# Patient Record
Sex: Female | Born: 1950
Health system: Southern US, Community
[De-identification: ages and names within clinical notes are randomized; demographics above are authoritative.]

## PROBLEM LIST (undated history)

## (undated) DIAGNOSIS — G4730 Sleep apnea, unspecified: Secondary | ICD-10-CM

## (undated) DIAGNOSIS — Z992 Dependence on renal dialysis: Secondary | ICD-10-CM

## (undated) DIAGNOSIS — D631 Anemia in chronic kidney disease: Secondary | ICD-10-CM

## (undated) DIAGNOSIS — Z9289 Personal history of other medical treatment: Secondary | ICD-10-CM

## (undated) DIAGNOSIS — E669 Obesity, unspecified: Secondary | ICD-10-CM

## (undated) DIAGNOSIS — J9621 Acute and chronic respiratory failure with hypoxia: Secondary | ICD-10-CM

## (undated) DIAGNOSIS — F32A Depression, unspecified: Secondary | ICD-10-CM

## (undated) DIAGNOSIS — N189 Chronic kidney disease, unspecified: Secondary | ICD-10-CM

## (undated) DIAGNOSIS — E119 Type 2 diabetes mellitus without complications: Secondary | ICD-10-CM

## (undated) DIAGNOSIS — I2510 Atherosclerotic heart disease of native coronary artery without angina pectoris: Secondary | ICD-10-CM

## (undated) DIAGNOSIS — I82401 Acute embolism and thrombosis of unspecified deep veins of right lower extremity: Secondary | ICD-10-CM

## (undated) DIAGNOSIS — I82409 Acute embolism and thrombosis of unspecified deep veins of unspecified lower extremity: Secondary | ICD-10-CM

## (undated) DIAGNOSIS — Z8701 Personal history of pneumonia (recurrent): Secondary | ICD-10-CM

## (undated) DIAGNOSIS — G9340 Encephalopathy, unspecified: Secondary | ICD-10-CM

## (undated) DIAGNOSIS — I472 Ventricular tachycardia, unspecified: Secondary | ICD-10-CM

## (undated) DIAGNOSIS — S82892A Other fracture of left lower leg, initial encounter for closed fracture: Secondary | ICD-10-CM

## (undated) DIAGNOSIS — F329 Major depressive disorder, single episode, unspecified: Secondary | ICD-10-CM

## (undated) DIAGNOSIS — K219 Gastro-esophageal reflux disease without esophagitis: Secondary | ICD-10-CM

## (undated) DIAGNOSIS — R410 Disorientation, unspecified: Secondary | ICD-10-CM

## (undated) DIAGNOSIS — M1990 Unspecified osteoarthritis, unspecified site: Secondary | ICD-10-CM

## (undated) DIAGNOSIS — S82899A Other fracture of unspecified lower leg, initial encounter for closed fracture: Secondary | ICD-10-CM

## (undated) DIAGNOSIS — N186 End stage renal disease: Secondary | ICD-10-CM

## (undated) DIAGNOSIS — F419 Anxiety disorder, unspecified: Secondary | ICD-10-CM

## (undated) DIAGNOSIS — B351 Tinea unguium: Secondary | ICD-10-CM

## (undated) DIAGNOSIS — E11610 Type 2 diabetes mellitus with diabetic neuropathic arthropathy: Secondary | ICD-10-CM

## (undated) DIAGNOSIS — N185 Chronic kidney disease, stage 5: Secondary | ICD-10-CM

## (undated) DIAGNOSIS — E785 Hyperlipidemia, unspecified: Secondary | ICD-10-CM

## (undated) DIAGNOSIS — R6510 Systemic inflammatory response syndrome (SIRS) of non-infectious origin without acute organ dysfunction: Secondary | ICD-10-CM

## (undated) DIAGNOSIS — I10 Essential (primary) hypertension: Secondary | ICD-10-CM

## (undated) DIAGNOSIS — J9601 Acute respiratory failure with hypoxia: Secondary | ICD-10-CM

## (undated) DIAGNOSIS — IMO0001 Reserved for inherently not codable concepts without codable children: Secondary | ICD-10-CM

## (undated) DIAGNOSIS — F319 Bipolar disorder, unspecified: Secondary | ICD-10-CM

## (undated) DIAGNOSIS — Z8709 Personal history of other diseases of the respiratory system: Secondary | ICD-10-CM

## (undated) DIAGNOSIS — R011 Cardiac murmur, unspecified: Secondary | ICD-10-CM

## (undated) DIAGNOSIS — I5042 Chronic combined systolic (congestive) and diastolic (congestive) heart failure: Secondary | ICD-10-CM

## (undated) HISTORY — DX: Type 2 diabetes mellitus with diabetic neuropathic arthropathy: E11.610

## (undated) HISTORY — DX: Anemia in chronic kidney disease: D63.1

## (undated) HISTORY — DX: Other fracture of unspecified lower leg, initial encounter for closed fracture: S82.899A

## (undated) HISTORY — DX: Other fracture of left lower leg, initial encounter for closed fracture: S82.892A

## (undated) HISTORY — DX: Ventricular tachycardia, unspecified: I47.20

## (undated) HISTORY — PX: CARDIAC CATHETERIZATION: SHX172

## (undated) HISTORY — DX: End stage renal disease: N18.6

## (undated) HISTORY — DX: Bipolar disorder, unspecified: F31.9

## (undated) HISTORY — PX: TONSILLECTOMY: SUR1361

## (undated) HISTORY — DX: Chronic combined systolic (congestive) and diastolic (congestive) heart failure: I50.42

## (undated) HISTORY — DX: Reserved for inherently not codable concepts without codable children: IMO0001

## (undated) HISTORY — PX: CORONARY STENT PLACEMENT: SHX1402

## (undated) HISTORY — DX: Atherosclerotic heart disease of native coronary artery without angina pectoris: I25.10

## (undated) HISTORY — DX: Chronic kidney disease, unspecified: N18.9

## (undated) HISTORY — DX: Ventricular tachycardia: I47.2

## (undated) HISTORY — DX: Tinea unguium: B35.1

## (undated) HISTORY — DX: Disorientation, unspecified: R41.0

## (undated) HISTORY — DX: Obesity, unspecified: E66.9

## (undated) HISTORY — DX: Hyperlipidemia, unspecified: E78.5

## (undated) HISTORY — DX: Acute and chronic respiratory failure with hypoxia: J96.21

## (undated) HISTORY — PX: ABDOMINAL HYSTERECTOMY: SHX81

## (undated) HISTORY — PX: CHOLECYSTECTOMY: SHX55

## (undated) HISTORY — DX: Encephalopathy, unspecified: G93.40

## (undated) HISTORY — DX: Systemic inflammatory response syndrome (sirs) of non-infectious origin without acute organ dysfunction: R65.10

## (undated) HISTORY — DX: Acute respiratory failure with hypoxia: J96.01

## (undated) HISTORY — DX: Acute embolism and thrombosis of unspecified deep veins of right lower extremity: I82.401

## (undated) HISTORY — DX: End stage renal disease: Z99.2

---

## 1997-10-03 ENCOUNTER — Ambulatory Visit (HOSPITAL_BASED_OUTPATIENT_CLINIC_OR_DEPARTMENT_OTHER): Admission: RE | Admit: 1997-10-03 | Discharge: 1997-10-03 | Payer: Self-pay | Admitting: General Surgery

## 1998-04-09 ENCOUNTER — Ambulatory Visit (HOSPITAL_BASED_OUTPATIENT_CLINIC_OR_DEPARTMENT_OTHER): Admission: RE | Admit: 1998-04-09 | Discharge: 1998-04-09 | Payer: Self-pay | Admitting: Specialist

## 1998-06-07 ENCOUNTER — Ambulatory Visit (HOSPITAL_BASED_OUTPATIENT_CLINIC_OR_DEPARTMENT_OTHER): Admission: RE | Admit: 1998-06-07 | Discharge: 1998-06-07 | Payer: Self-pay | Admitting: General Surgery

## 1998-06-24 ENCOUNTER — Encounter: Payer: Self-pay | Admitting: Emergency Medicine

## 1998-06-24 ENCOUNTER — Emergency Department (HOSPITAL_COMMUNITY): Admission: EM | Admit: 1998-06-24 | Discharge: 1998-06-24 | Payer: Self-pay | Admitting: Emergency Medicine

## 1998-07-17 ENCOUNTER — Ambulatory Visit (HOSPITAL_COMMUNITY): Admission: RE | Admit: 1998-07-17 | Discharge: 1998-07-17 | Payer: Self-pay | Admitting: Nephrology

## 1998-07-17 ENCOUNTER — Encounter: Payer: Self-pay | Admitting: Nephrology

## 1998-12-31 ENCOUNTER — Inpatient Hospital Stay (HOSPITAL_COMMUNITY): Admission: EM | Admit: 1998-12-31 | Discharge: 1999-01-01 | Payer: Self-pay | Admitting: Nephrology

## 1999-01-01 ENCOUNTER — Inpatient Hospital Stay (HOSPITAL_COMMUNITY): Admission: AD | Admit: 1999-01-01 | Discharge: 1999-01-14 | Payer: Self-pay | Admitting: *Deleted

## 1999-03-24 ENCOUNTER — Encounter: Payer: Self-pay | Admitting: Emergency Medicine

## 1999-03-24 ENCOUNTER — Emergency Department (HOSPITAL_COMMUNITY): Admission: EM | Admit: 1999-03-24 | Discharge: 1999-03-24 | Payer: Self-pay | Admitting: Emergency Medicine

## 1999-04-03 ENCOUNTER — Inpatient Hospital Stay (HOSPITAL_COMMUNITY): Admission: AD | Admit: 1999-04-03 | Discharge: 1999-04-08 | Payer: Self-pay | Admitting: Specialist

## 2000-02-19 ENCOUNTER — Inpatient Hospital Stay (HOSPITAL_COMMUNITY): Admission: EM | Admit: 2000-02-19 | Discharge: 2000-02-26 | Payer: Self-pay | Admitting: Specialist

## 2000-06-17 ENCOUNTER — Encounter: Admission: RE | Admit: 2000-06-17 | Discharge: 2000-09-15 | Payer: Self-pay | Admitting: Specialist

## 2000-07-27 ENCOUNTER — Emergency Department (HOSPITAL_COMMUNITY): Admission: EM | Admit: 2000-07-27 | Discharge: 2000-07-27 | Payer: Self-pay

## 2000-10-13 ENCOUNTER — Encounter: Payer: Self-pay | Admitting: Gastroenterology

## 2000-10-13 ENCOUNTER — Encounter: Admission: RE | Admit: 2000-10-13 | Discharge: 2000-10-13 | Payer: Self-pay | Admitting: Gastroenterology

## 2001-01-27 ENCOUNTER — Encounter: Payer: Self-pay | Admitting: Nephrology

## 2001-01-27 ENCOUNTER — Encounter: Admission: RE | Admit: 2001-01-27 | Discharge: 2001-01-27 | Payer: Self-pay | Admitting: Nephrology

## 2001-03-03 ENCOUNTER — Encounter: Payer: Self-pay | Admitting: Nephrology

## 2001-03-03 ENCOUNTER — Inpatient Hospital Stay (HOSPITAL_COMMUNITY): Admission: AD | Admit: 2001-03-03 | Discharge: 2001-03-10 | Payer: Self-pay | Admitting: Nephrology

## 2001-03-04 ENCOUNTER — Encounter: Payer: Self-pay | Admitting: Nephrology

## 2001-03-05 ENCOUNTER — Encounter: Payer: Self-pay | Admitting: Nephrology

## 2001-03-08 ENCOUNTER — Encounter: Payer: Self-pay | Admitting: Nephrology

## 2001-03-10 ENCOUNTER — Inpatient Hospital Stay (HOSPITAL_COMMUNITY): Admission: EM | Admit: 2001-03-10 | Discharge: 2001-03-15 | Payer: Self-pay | Admitting: *Deleted

## 2001-04-06 ENCOUNTER — Encounter: Payer: Self-pay | Admitting: Emergency Medicine

## 2001-04-06 ENCOUNTER — Emergency Department (HOSPITAL_COMMUNITY): Admission: EM | Admit: 2001-04-06 | Discharge: 2001-04-06 | Payer: Self-pay | Admitting: Emergency Medicine

## 2001-04-09 ENCOUNTER — Encounter: Payer: Self-pay | Admitting: Emergency Medicine

## 2001-04-09 ENCOUNTER — Emergency Department (HOSPITAL_COMMUNITY): Admission: EM | Admit: 2001-04-09 | Discharge: 2001-04-09 | Payer: Self-pay | Admitting: Emergency Medicine

## 2001-07-13 ENCOUNTER — Encounter: Payer: Self-pay | Admitting: Nephrology

## 2001-07-13 ENCOUNTER — Encounter: Admission: RE | Admit: 2001-07-13 | Discharge: 2001-07-13 | Payer: Self-pay | Admitting: Nephrology

## 2001-07-14 ENCOUNTER — Inpatient Hospital Stay (HOSPITAL_COMMUNITY): Admission: EM | Admit: 2001-07-14 | Discharge: 2001-07-19 | Payer: Self-pay | Admitting: *Deleted

## 2001-07-14 ENCOUNTER — Encounter: Payer: Self-pay | Admitting: Nephrology

## 2001-07-16 ENCOUNTER — Encounter: Payer: Self-pay | Admitting: Nephrology

## 2001-08-11 ENCOUNTER — Inpatient Hospital Stay (HOSPITAL_COMMUNITY): Admission: EM | Admit: 2001-08-11 | Discharge: 2001-08-17 | Payer: Self-pay | Admitting: Psychiatry

## 2001-09-24 ENCOUNTER — Encounter: Admission: RE | Admit: 2001-09-24 | Discharge: 2001-09-24 | Payer: Self-pay | Admitting: Nephrology

## 2001-09-24 ENCOUNTER — Encounter: Payer: Self-pay | Admitting: Nephrology

## 2001-10-26 ENCOUNTER — Encounter: Payer: Self-pay | Admitting: Neurology

## 2001-10-26 ENCOUNTER — Encounter: Payer: Self-pay | Admitting: Emergency Medicine

## 2001-10-26 ENCOUNTER — Emergency Department (HOSPITAL_COMMUNITY): Admission: EM | Admit: 2001-10-26 | Discharge: 2001-10-26 | Payer: Self-pay | Admitting: Emergency Medicine

## 2002-01-11 ENCOUNTER — Emergency Department (HOSPITAL_COMMUNITY): Admission: EM | Admit: 2002-01-11 | Discharge: 2002-01-11 | Payer: Self-pay | Admitting: Emergency Medicine

## 2002-04-05 ENCOUNTER — Encounter: Payer: Self-pay | Admitting: Nephrology

## 2002-04-05 ENCOUNTER — Encounter: Admission: RE | Admit: 2002-04-05 | Discharge: 2002-04-05 | Payer: Self-pay | Admitting: Nephrology

## 2002-06-28 ENCOUNTER — Encounter: Admission: RE | Admit: 2002-06-28 | Discharge: 2002-06-28 | Payer: Self-pay | Admitting: Nephrology

## 2002-06-28 ENCOUNTER — Encounter: Payer: Self-pay | Admitting: Nephrology

## 2002-07-18 ENCOUNTER — Encounter: Payer: Self-pay | Admitting: Nephrology

## 2002-07-18 ENCOUNTER — Ambulatory Visit (HOSPITAL_COMMUNITY): Admission: RE | Admit: 2002-07-18 | Discharge: 2002-07-18 | Payer: Self-pay | Admitting: Nephrology

## 2002-10-06 ENCOUNTER — Encounter: Payer: Self-pay | Admitting: Emergency Medicine

## 2002-10-06 ENCOUNTER — Inpatient Hospital Stay (HOSPITAL_COMMUNITY): Admission: EM | Admit: 2002-10-06 | Discharge: 2002-10-07 | Payer: Self-pay | Admitting: Emergency Medicine

## 2002-10-14 ENCOUNTER — Emergency Department (HOSPITAL_COMMUNITY): Admission: EM | Admit: 2002-10-14 | Discharge: 2002-10-15 | Payer: Self-pay | Admitting: Emergency Medicine

## 2002-10-14 ENCOUNTER — Encounter: Payer: Self-pay | Admitting: Emergency Medicine

## 2003-10-25 ENCOUNTER — Encounter: Admission: RE | Admit: 2003-10-25 | Discharge: 2003-10-25 | Payer: Self-pay | Admitting: Nephrology

## 2003-10-27 ENCOUNTER — Inpatient Hospital Stay (HOSPITAL_COMMUNITY): Admission: EM | Admit: 2003-10-27 | Discharge: 2003-10-31 | Payer: Self-pay | Admitting: Emergency Medicine

## 2004-01-16 ENCOUNTER — Inpatient Hospital Stay (HOSPITAL_COMMUNITY): Admission: EM | Admit: 2004-01-16 | Discharge: 2004-01-23 | Payer: Self-pay | Admitting: Emergency Medicine

## 2004-02-26 ENCOUNTER — Ambulatory Visit (HOSPITAL_COMMUNITY): Admission: RE | Admit: 2004-02-26 | Discharge: 2004-02-26 | Payer: Self-pay | Admitting: Cardiology

## 2004-10-24 ENCOUNTER — Ambulatory Visit: Payer: Self-pay | Admitting: Psychiatry

## 2004-10-24 ENCOUNTER — Inpatient Hospital Stay (HOSPITAL_COMMUNITY): Admission: EM | Admit: 2004-10-24 | Discharge: 2004-11-02 | Payer: Self-pay | Admitting: Psychiatry

## 2004-11-05 ENCOUNTER — Emergency Department (HOSPITAL_COMMUNITY): Admission: EM | Admit: 2004-11-05 | Discharge: 2004-11-05 | Payer: Self-pay | Admitting: Emergency Medicine

## 2004-11-25 ENCOUNTER — Encounter: Admission: RE | Admit: 2004-11-25 | Discharge: 2004-11-25 | Payer: Self-pay | Admitting: Nephrology

## 2005-08-18 ENCOUNTER — Ambulatory Visit (HOSPITAL_COMMUNITY): Admission: RE | Admit: 2005-08-18 | Discharge: 2005-08-18 | Payer: Self-pay | Admitting: Nephrology

## 2006-01-09 ENCOUNTER — Inpatient Hospital Stay (HOSPITAL_COMMUNITY): Admission: AD | Admit: 2006-01-09 | Discharge: 2006-01-12 | Payer: Self-pay | Admitting: *Deleted

## 2006-01-09 ENCOUNTER — Ambulatory Visit: Payer: Self-pay | Admitting: Psychiatry

## 2006-01-09 ENCOUNTER — Encounter: Payer: Self-pay | Admitting: Emergency Medicine

## 2007-06-09 ENCOUNTER — Encounter: Admission: RE | Admit: 2007-06-09 | Discharge: 2007-06-09 | Payer: Self-pay | Admitting: Nephrology

## 2007-06-12 ENCOUNTER — Inpatient Hospital Stay (HOSPITAL_COMMUNITY): Admission: EM | Admit: 2007-06-12 | Discharge: 2007-06-18 | Payer: Self-pay | Admitting: Emergency Medicine

## 2007-06-13 ENCOUNTER — Encounter: Payer: Self-pay | Admitting: Cardiovascular Disease

## 2007-09-03 ENCOUNTER — Inpatient Hospital Stay (HOSPITAL_COMMUNITY): Admission: EM | Admit: 2007-09-03 | Discharge: 2007-09-08 | Payer: Self-pay | Admitting: Emergency Medicine

## 2007-09-06 ENCOUNTER — Encounter (INDEPENDENT_AMBULATORY_CARE_PROVIDER_SITE_OTHER): Payer: Self-pay | Admitting: Cardiovascular Disease

## 2007-09-06 ENCOUNTER — Ambulatory Visit: Payer: Self-pay | Admitting: Vascular Surgery

## 2007-09-06 ENCOUNTER — Encounter: Payer: Self-pay | Admitting: Cardiovascular Disease

## 2008-02-27 ENCOUNTER — Inpatient Hospital Stay (HOSPITAL_COMMUNITY): Admission: EM | Admit: 2008-02-27 | Discharge: 2008-03-02 | Payer: Self-pay | Admitting: Emergency Medicine

## 2008-08-07 ENCOUNTER — Encounter: Admission: RE | Admit: 2008-08-07 | Discharge: 2008-08-07 | Payer: Self-pay | Admitting: Nephrology

## 2009-02-22 ENCOUNTER — Encounter: Admission: RE | Admit: 2009-02-22 | Discharge: 2009-02-22 | Payer: Self-pay | Admitting: Nephrology

## 2009-08-01 ENCOUNTER — Inpatient Hospital Stay (HOSPITAL_COMMUNITY): Admission: AD | Admit: 2009-08-01 | Discharge: 2009-08-04 | Payer: Self-pay | Admitting: Cardiology

## 2009-11-28 ENCOUNTER — Inpatient Hospital Stay (HOSPITAL_COMMUNITY): Admission: EM | Admit: 2009-11-28 | Discharge: 2009-12-05 | Payer: Self-pay | Admitting: Emergency Medicine

## 2009-11-29 ENCOUNTER — Encounter (INDEPENDENT_AMBULATORY_CARE_PROVIDER_SITE_OTHER): Payer: Self-pay | Admitting: Internal Medicine

## 2009-12-04 ENCOUNTER — Encounter (INDEPENDENT_AMBULATORY_CARE_PROVIDER_SITE_OTHER): Payer: Self-pay | Admitting: Nephrology

## 2010-08-11 LAB — PROTEIN ELECTROPH W RFLX QUANT IMMUNOGLOBULINS
Albumin ELP: 51.1 % — ABNORMAL LOW (ref 55.8–66.1)
Alpha-1-Globulin: 5.1 % — ABNORMAL HIGH (ref 2.9–4.9)
Alpha-2-Globulin: 15.9 % — ABNORMAL HIGH (ref 7.1–11.8)
Beta 2: 6.7 % — ABNORMAL HIGH (ref 3.2–6.5)
Beta Globulin: 6.4 % (ref 4.7–7.2)
Total Protein ELP: 6.1 g/dL (ref 6.0–8.3)

## 2010-08-11 LAB — COMPREHENSIVE METABOLIC PANEL
ALT: 16 U/L (ref 0–35)
AST: 14 U/L (ref 0–37)
AST: 18 U/L (ref 0–37)
AST: 22 U/L (ref 0–37)
Albumin: 2.8 g/dL — ABNORMAL LOW (ref 3.5–5.2)
Albumin: 3.1 g/dL — ABNORMAL LOW (ref 3.5–5.2)
Albumin: 3.2 g/dL — ABNORMAL LOW (ref 3.5–5.2)
Alkaline Phosphatase: 65 U/L (ref 39–117)
Alkaline Phosphatase: 76 U/L (ref 39–117)
CO2: 23 mEq/L (ref 19–32)
Chloride: 106 mEq/L (ref 96–112)
Chloride: 106 mEq/L (ref 96–112)
Chloride: 107 mEq/L (ref 96–112)
Creatinine, Ser: 2.41 mg/dL — ABNORMAL HIGH (ref 0.4–1.2)
Creatinine, Ser: 2.69 mg/dL — ABNORMAL HIGH (ref 0.4–1.2)
GFR calc Af Amer: 16 mL/min — ABNORMAL LOW (ref >60)
GFR calc Af Amer: 22 mL/min — ABNORMAL LOW (ref >60)
GFR calc Af Amer: 25 mL/min — ABNORMAL LOW (ref >60)
GFR calc non Af Amer: 21 mL/min — ABNORMAL LOW (ref >60)
Potassium: 3 mEq/L — ABNORMAL LOW (ref 3.5–5.1)
Potassium: 3.6 mEq/L (ref 3.5–5.1)
Potassium: 3.7 mEq/L (ref 3.5–5.1)
Total Bilirubin: 0.4 mg/dL (ref 0.3–1.2)
Total Bilirubin: 0.5 mg/dL (ref 0.3–1.2)
Total Bilirubin: 0.6 mg/dL (ref 0.3–1.2)

## 2010-08-11 LAB — GLUCOSE, CAPILLARY
Glucose-Capillary: 119 mg/dL — ABNORMAL HIGH (ref 70–99)
Glucose-Capillary: 124 mg/dL — ABNORMAL HIGH (ref 70–99)
Glucose-Capillary: 124 mg/dL — ABNORMAL HIGH (ref 70–99)
Glucose-Capillary: 132 mg/dL — ABNORMAL HIGH (ref 70–99)
Glucose-Capillary: 147 mg/dL — ABNORMAL HIGH (ref 70–99)
Glucose-Capillary: 151 mg/dL — ABNORMAL HIGH (ref 70–99)
Glucose-Capillary: 157 mg/dL — ABNORMAL HIGH (ref 70–99)
Glucose-Capillary: 159 mg/dL — ABNORMAL HIGH (ref 70–99)
Glucose-Capillary: 209 mg/dL — ABNORMAL HIGH (ref 70–99)
Glucose-Capillary: 222 mg/dL — ABNORMAL HIGH (ref 70–99)
Glucose-Capillary: 239 mg/dL — ABNORMAL HIGH (ref 70–99)
Glucose-Capillary: 253 mg/dL — ABNORMAL HIGH (ref 70–99)
Glucose-Capillary: 300 mg/dL — ABNORMAL HIGH (ref 70–99)
Glucose-Capillary: 56 mg/dL — ABNORMAL LOW (ref 70–99)
Glucose-Capillary: 78 mg/dL (ref 70–99)

## 2010-08-11 LAB — DIFFERENTIAL
Basophils Absolute: 0.1 10*3/uL (ref 0.0–0.1)
Basophils Absolute: 0.1 10*3/uL (ref 0.0–0.1)
Basophils Absolute: 0.1 10*3/uL (ref 0.0–0.1)
Basophils Relative: 1 % (ref 0–1)
Basophils Relative: 1 % (ref 0–1)
Basophils Relative: 1 % (ref 0–1)
Eosinophils Relative: 2 % (ref 0–5)
Eosinophils Relative: 3 % (ref 0–5)
Lymphocytes Relative: 28 % (ref 12–46)
Lymphocytes Relative: 33 % (ref 12–46)
Lymphocytes Relative: 36 % (ref 12–46)
Lymphocytes Relative: 36 % (ref 12–46)
Lymphs Abs: 2.7 10*3/uL (ref 0.7–4.0)
Monocytes Absolute: 0.7 10*3/uL (ref 0.1–1.0)
Monocytes Absolute: 0.8 10*3/uL (ref 0.1–1.0)
Monocytes Absolute: 1 10*3/uL (ref 0.1–1.0)
Monocytes Relative: 10 % (ref 3–12)
Monocytes Relative: 10 % (ref 3–12)
Neutro Abs: 3.7 10*3/uL (ref 1.7–7.7)
Neutro Abs: 3.7 10*3/uL (ref 1.7–7.7)
Neutro Abs: 3.8 10*3/uL (ref 1.7–7.7)
Neutro Abs: 4.6 10*3/uL (ref 1.7–7.7)
Neutrophils Relative %: 50 % (ref 43–77)
Neutrophils Relative %: 55 % (ref 43–77)
Neutrophils Relative %: 60 % (ref 43–77)

## 2010-08-11 LAB — URINALYSIS, ROUTINE W REFLEX MICROSCOPIC
Bilirubin Urine: NEGATIVE
Glucose, UA: 500 mg/dL — AB
Ketones, ur: NEGATIVE mg/dL
Leukocytes, UA: NEGATIVE
Nitrite: NEGATIVE
Protein, ur: >300 mg/dL — AB

## 2010-08-11 LAB — CBC
HCT: 30.7 % — ABNORMAL LOW (ref 36.0–46.0)
HCT: 31.4 % — ABNORMAL LOW (ref 36.0–46.0)
HCT: 33.3 % — ABNORMAL LOW (ref 36.0–46.0)
Hemoglobin: 10.1 g/dL — ABNORMAL LOW (ref 12.0–15.0)
Hemoglobin: 10.5 g/dL — ABNORMAL LOW (ref 12.0–15.0)
Hemoglobin: 11 g/dL — ABNORMAL LOW (ref 12.0–15.0)
Hemoglobin: 11.6 g/dL — ABNORMAL LOW (ref 12.0–15.0)
MCH: 32.6 pg (ref 26.0–34.0)
MCH: 33 pg (ref 26.0–34.0)
MCHC: 33.1 g/dL (ref 30.0–36.0)
MCHC: 34 g/dL (ref 30.0–36.0)
MCV: 96.3 fL (ref 78.0–100.0)
MCV: 97.5 fL (ref 78.0–100.0)
MCV: 97.8 fL (ref 78.0–100.0)
Platelets: 214 10*3/uL (ref 150–400)
Platelets: 221 10*3/uL (ref 150–400)
Platelets: 232 10*3/uL (ref 150–400)
RBC: 3.09 MIL/uL — ABNORMAL LOW (ref 3.87–5.11)
RBC: 3.16 MIL/uL — ABNORMAL LOW (ref 3.87–5.11)
RBC: 3.57 MIL/uL — ABNORMAL LOW (ref 3.87–5.11)
RDW: 14.6 % (ref 11.5–15.5)
RDW: 14.9 % (ref 11.5–15.5)
RDW: 15.1 % (ref 11.5–15.5)
RDW: 15.1 % (ref 11.5–15.5)
WBC: 5.8 10*3/uL (ref 4.0–10.5)
WBC: 6.9 10*3/uL (ref 4.0–10.5)
WBC: 7.4 10*3/uL (ref 4.0–10.5)
WBC: 7.5 10*3/uL (ref 4.0–10.5)
WBC: 7.7 10*3/uL (ref 4.0–10.5)
WBC: 7.9 10*3/uL (ref 4.0–10.5)

## 2010-08-11 LAB — CK TOTAL AND CKMB (NOT AT ARMC)
CK, MB: 10.8 ng/mL (ref 0.3–4.0)
Relative Index: 2.8 — ABNORMAL HIGH (ref 0.0–2.5)
Relative Index: 3 — ABNORMAL HIGH (ref 0.0–2.5)
Total CK: 258 U/L — ABNORMAL HIGH (ref 7–177)
Total CK: 381 U/L — ABNORMAL HIGH (ref 7–177)

## 2010-08-11 LAB — RENAL FUNCTION PANEL
Albumin: 3 g/dL — ABNORMAL LOW (ref 3.5–5.2)
BUN: 31 mg/dL — ABNORMAL HIGH (ref 6–23)
BUN: 39 mg/dL — ABNORMAL HIGH (ref 6–23)
BUN: 47 mg/dL — ABNORMAL HIGH (ref 6–23)
BUN: 54 mg/dL — ABNORMAL HIGH (ref 6–23)
Calcium: 8.7 mg/dL (ref 8.4–10.5)
Chloride: 108 mEq/L (ref 96–112)
Chloride: 109 mEq/L (ref 96–112)
Creatinine, Ser: 2.34 mg/dL — ABNORMAL HIGH (ref 0.4–1.2)
Creatinine, Ser: 3.21 mg/dL — ABNORMAL HIGH (ref 0.4–1.2)
Creatinine, Ser: 3.56 mg/dL — ABNORMAL HIGH (ref 0.4–1.2)
GFR calc Af Amer: 26 mL/min — ABNORMAL LOW (ref >60)
GFR calc non Af Amer: 21 mL/min — ABNORMAL LOW (ref >60)
Glucose, Bld: 100 mg/dL — ABNORMAL HIGH (ref 70–99)
Glucose, Bld: 103 mg/dL — ABNORMAL HIGH (ref 70–99)
Glucose, Bld: 90 mg/dL (ref 70–99)
Phosphorus: 5 mg/dL — ABNORMAL HIGH (ref 2.3–4.6)
Phosphorus: 5.3 mg/dL — ABNORMAL HIGH (ref 2.3–4.6)
Potassium: 3.7 mEq/L (ref 3.5–5.1)
Potassium: 3.9 mEq/L (ref 3.5–5.1)
Potassium: 4 mEq/L (ref 3.5–5.1)
Sodium: 143 mEq/L (ref 135–145)

## 2010-08-11 LAB — TROPONIN I
Troponin I: 0.05 ng/mL (ref 0.00–0.06)
Troponin I: 0.08 ng/mL — ABNORMAL HIGH (ref 0.00–0.06)

## 2010-08-11 LAB — POCT CARDIAC MARKERS
Myoglobin, poc: 405 ng/mL (ref 12–200)
Troponin i, poc: <0.05 ng/mL (ref 0.00–0.09)

## 2010-08-11 LAB — POCT I-STAT, CHEM 8
BUN: 36 mg/dL — ABNORMAL HIGH (ref 6–23)
Chloride: 110 mEq/L (ref 96–112)
HCT: 32 % — ABNORMAL LOW (ref 36.0–46.0)
Sodium: 143 mEq/L (ref 135–145)

## 2010-08-11 LAB — BRAIN NATRIURETIC PEPTIDE
Pro B Natriuretic peptide (BNP): 1522 pg/mL — ABNORMAL HIGH (ref 0.0–100.0)
Pro B Natriuretic peptide (BNP): 1723 pg/mL — ABNORMAL HIGH (ref 0.0–100.0)
Pro B Natriuretic peptide (BNP): 791 pg/mL — ABNORMAL HIGH (ref 0.0–100.0)

## 2010-08-11 LAB — URINE MICROSCOPIC-ADD ON

## 2010-08-11 LAB — LIPID PANEL
Cholesterol: 137 mg/dL (ref 0–200)
HDL: 30 mg/dL — ABNORMAL LOW (ref >39)
LDL Cholesterol: 72 mg/dL (ref 0–99)
Triglycerides: 235 mg/dL — ABNORMAL HIGH (ref <150)
VLDL: 36 mg/dL (ref 0–40)
VLDL: 47 mg/dL — ABNORMAL HIGH (ref 0–40)

## 2010-08-11 LAB — PROTIME-INR: Prothrombin Time: 14.2 seconds (ref 11.6–15.2)

## 2010-08-11 LAB — ANA: Anti Nuclear Antibody(ANA): NEGATIVE

## 2010-08-11 LAB — TSH: TSH: 0.609 u[IU]/mL (ref 0.350–4.500)

## 2010-08-11 LAB — MRSA PCR SCREENING: MRSA by PCR: NEGATIVE

## 2010-08-11 LAB — HEMOGLOBIN A1C: Mean Plasma Glucose: 197 mg/dL — ABNORMAL HIGH (ref <117)

## 2010-08-11 LAB — MAGNESIUM: Magnesium: 2.2 mg/dL (ref 1.5–2.5)

## 2010-08-16 LAB — CARDIAC PANEL(CRET KIN+CKTOT+MB+TROPI)
CK, MB: 3.3 ng/mL (ref 0.3–4.0)
CK, MB: 3.8 ng/mL (ref 0.3–4.0)
Relative Index: 2.2 (ref 0.0–2.5)
Relative Index: 2.7 — ABNORMAL HIGH (ref 0.0–2.5)
Total CK: 135 U/L (ref 7–177)
Total CK: 175 U/L (ref 7–177)
Total CK: 186 U/L — ABNORMAL HIGH (ref 7–177)
Troponin I: 0.08 ng/mL — ABNORMAL HIGH (ref 0.00–0.06)

## 2010-08-16 LAB — LIPID PANEL
Cholesterol: 122 mg/dL (ref 0–200)
LDL Cholesterol: 42 mg/dL (ref 0–99)
Triglycerides: 257 mg/dL — ABNORMAL HIGH (ref <150)

## 2010-08-16 LAB — URINALYSIS, MICROSCOPIC ONLY
Glucose, UA: 250 mg/dL — AB
Protein, ur: 100 mg/dL — AB
Urobilinogen, UA: 0.2 mg/dL (ref 0.0–1.0)

## 2010-08-16 LAB — BASIC METABOLIC PANEL
BUN: 53 mg/dL — ABNORMAL HIGH (ref 6–23)
CO2: 26 mEq/L (ref 19–32)
Calcium: 8.5 mg/dL (ref 8.4–10.5)
Creatinine, Ser: 3.24 mg/dL — ABNORMAL HIGH (ref 0.4–1.2)
GFR calc Af Amer: 23 mL/min — ABNORMAL LOW (ref >60)
GFR calc non Af Amer: 15 mL/min — ABNORMAL LOW (ref >60)
GFR calc non Af Amer: 19 mL/min — ABNORMAL LOW (ref >60)
Glucose, Bld: 266 mg/dL — ABNORMAL HIGH (ref 70–99)
Potassium: 4.7 mEq/L (ref 3.5–5.1)
Sodium: 138 mEq/L (ref 135–145)

## 2010-08-16 LAB — DIFFERENTIAL
Eosinophils Absolute: 0.1 10*3/uL (ref 0.0–0.7)
Lymphocytes Relative: 27 % (ref 12–46)
Lymphs Abs: 2.7 10*3/uL (ref 0.7–4.0)
Monocytes Relative: 7 % (ref 3–12)
Neutro Abs: 6.6 10*3/uL (ref 1.7–7.7)
Neutrophils Relative %: 66 % (ref 43–77)

## 2010-08-16 LAB — PROTIME-INR
INR: 1.08 (ref 0.00–1.49)
Prothrombin Time: 13.9 seconds (ref 11.6–15.2)

## 2010-08-16 LAB — GLUCOSE, CAPILLARY
Glucose-Capillary: 212 mg/dL — ABNORMAL HIGH (ref 70–99)
Glucose-Capillary: 245 mg/dL — ABNORMAL HIGH (ref 70–99)
Glucose-Capillary: 260 mg/dL — ABNORMAL HIGH (ref 70–99)
Glucose-Capillary: 350 mg/dL — ABNORMAL HIGH (ref 70–99)
Glucose-Capillary: 366 mg/dL — ABNORMAL HIGH (ref 70–99)

## 2010-08-16 LAB — CBC
Hemoglobin: 9.6 g/dL — ABNORMAL LOW (ref 12.0–15.0)
MCHC: 34.7 g/dL (ref 30.0–36.0)
MCV: 97.7 fL (ref 78.0–100.0)
MCV: 97.7 fL (ref 78.0–100.0)
Platelets: 195 10*3/uL (ref 150–400)
Platelets: 221 10*3/uL (ref 150–400)
RBC: 2.83 MIL/uL — ABNORMAL LOW (ref 3.87–5.11)
RBC: 3.22 MIL/uL — ABNORMAL LOW (ref 3.87–5.11)
RDW: 14.3 % (ref 11.5–15.5)
WBC: 10.1 10*3/uL (ref 4.0–10.5)
WBC: 11.2 10*3/uL — ABNORMAL HIGH (ref 4.0–10.5)
WBC: 7.5 10*3/uL (ref 4.0–10.5)

## 2010-08-16 LAB — HEMOGLOBIN A1C: Mean Plasma Glucose: 246 mg/dL

## 2010-08-16 LAB — COMPREHENSIVE METABOLIC PANEL
AST: 30 U/L (ref 0–37)
Albumin: 2.9 g/dL — ABNORMAL LOW (ref 3.5–5.2)
Alkaline Phosphatase: 64 U/L (ref 39–117)
Chloride: 104 mEq/L (ref 96–112)
GFR calc Af Amer: 17 mL/min — ABNORMAL LOW (ref >60)
Potassium: 3.7 mEq/L (ref 3.5–5.1)
Total Bilirubin: 0.3 mg/dL (ref 0.3–1.2)
Total Protein: 6 g/dL (ref 6.0–8.3)

## 2010-08-16 LAB — HEPARIN LEVEL (UNFRACTIONATED)
Heparin Unfractionated: 0.76 IU/mL — ABNORMAL HIGH (ref 0.30–0.70)
Heparin Unfractionated: <0.1 IU/mL — ABNORMAL LOW (ref 0.30–0.70)

## 2010-08-16 LAB — APTT: aPTT: 30 seconds (ref 24–37)

## 2010-08-16 LAB — MAGNESIUM: Magnesium: 2.1 mg/dL (ref 1.5–2.5)

## 2010-08-16 LAB — TSH: TSH: 1.291 u[IU]/mL (ref 0.350–4.500)

## 2010-10-08 NOTE — Cardiovascular Report (Signed)
[name], Robin [name] NO.:  [id number]   MEDICAL RECORD NO.:  [id number]          PATIENT TYPE:  INP   LOCATION:  2903                         FACILITY:  [facility]   PHYSICIAN:  Robin [name], M.D.   DATE OF BIRTH:  02-Jun-1950   DATE OF PROCEDURE:  06/14/2007  DATE OF DISCHARGE:                            CARDIAC CATHETERIZATION   HISTORY OF PRESENT ILLNESS:  Robin [name] is a 60 year old mildly  overweight African-American female admitted June 12, 2007 with chest  pain and shortness of breath.  She did have mildly elevated enzymes and  acute pulmonary edema.  Her D-dimer is measured 0.92.  She was placed on  IV heparin and Integrilin as well as nitro.  She presents now for right  and left heart cath to define her anatomy and physiology and rule out  ischemic etiology.  She does have a history of remote ramus branch  cutting balloon atherectomy by Dr. Pernell [name] in 2004 with an EF of 40%  at that time.   DESCRIPTION OF PROCEDURE:  The patient was brought to the second floor  [place] cardiac catheterization laboratory in the postabsorptive  state.  She was premedicated with p.o. Valium.  She did receive IV  morphine and fentanyl in the lab.   Her right groin was prepped and shaved in the usual sterile fashion.  Xylocaine 1% was used for local anesthesia.  When the patient arrived in  the cath lab, she became profoundly hypoxic and tachycardiac with  increased systolic blood pressure in the 230 range with diastolics in  the [id number] range.  Her 4 liter face mask was changed to nonrebreather,  however she remained diaphoretic, tachypneic and tachycardiac.  I called  respiratory STAT and changed her to BiPAP, gave her 80 mg of Lasix,  increased her IV nitro and gave her a Natrecor bolus infusion.  She had  a fairly good diuresis and ultimately her hemodynamic and respiratory  status slowly improved to the point where we were unable to proceed with  her diagnostic  procedure.   A 7-French sheath was inserted into the right femoral vein using  standard Seldinger technique.  A 6-French sheath was inserted to the  right femoral artery.  A 7-French balloon-tipped thermal dilution Swan-  Ganz catheter was then advanced through the right heart chambers  obtaining sequential pressures.  A 6-French right-left Judkins  diagnostic catheter as well as a 6-French pigtail catheter were used for  selective coronary angiography, left ventriculography, distal abdominal  aortography.  Visipaque dye was used for the entirety of the case.  Retrogradeaortic, left ventricular, and pullback pressures were  recorded.   HEMODYNAMICS:  1. Aortic systolic pressure 99991111, diastolic pressure 99991111, mean 147.  2. Left ventricle systolic pressure 0000000, diastolic pressure 29.  3. RA pressure A-wave 13, V-wave 11, mean 11.  4. RV systolic pressure 42, diastolic pressure 9, end-diastolic      pressure 19.  5. PA systolic 42, diastolic pressure 25, mean 34.  6. Pulmonary capital wedge pressure A-wave 17, V-wave 16, mean 13.  There was excessive respiratory variation.   SELECTIVE CORONARY ANGIOGRAPHY:  1. Left main normal.  2. LAD; LAD itself was normal, it was large and wrapped the apex.  It      gave off three diagonal branches, a small first diagonal branch had      a 90% proximal stenosis.  3. Ramus branch; this was moderate size vessel that arrived in a high      diagonal distribution and was free of significant disease.  4. Left circumflex; appeared to be codominant vessel that gave off a      PDA that was free of significant disease.  5. Right coronary artery; this was a codominant vessel with 30%      segmental proximal stenosis, but otherwise free of significant      disease.  6. Left ventriculography; RAO left ventriculogram was performed using      20 mL of Visipaque dye at 10 mL per second.  The overall LVEF was      estimated visually at approximate 35% with  moderate anteroapical      hypokinesia and severe inferoapical hypokinesia.  7. Distal abdominal aortography; distal abdominal aortogram was      performed using 20 mL of Visipaque dye at 20 mL per second.  The      renal arteries were widely patent.  The infrarenal abdominal aorta      and iliac bifurcation were free of significant atherosclerotic      changes.   IMPRESSION:  Ms. Robin [name] had acute pulmonary edema on the table, however,  her filling pressures were not that high after we initiated appropriate  pharmacologic therapy.  She has essentially noncritical coronary artery  disease with a moderately severe left ventricular dysfunction,  nonischemic cardiomyopathy.  I am unclear why she went into acute  pulmonary edema.  Her D-dimer on admission was 0.92, thus she was on  anticoagulants.  She will need CT angiogram to rule out PE though I  think this is a low likelihood.  She is also on empiric antibiotics.  We  will continue the Natrecor and the IV nitro and will adjust her  medicines.   The sheath was sewn securely in place.  The patient left the lab with  Swan in place to the unit on BiPAP appearing more comfortable and  hemodynamically stable.      Robin [name], M.D.  Electronically Signed     JB/MEDQ  D:  06/14/2007  T:  06/14/2007  Job:  [id number]   cc:   Robin [name] Cardiac Cath Lab  [facility]

## 2010-10-08 NOTE — Discharge Summary (Signed)
Robin [name], [name]                  ACCOUNT NO.:  [id number]   MEDICAL RECORD NO.:  [id number]          PATIENT TYPE:  INP   LOCATION:  [id number]                         FACILITY:  [facility]   PHYSICIAN:  Minna Merritts, MD   DATE OF BIRTH:  [birth year]   DATE OF ADMISSION:  06/12/2007  DATE OF DISCHARGE:  06/18/2007                               DISCHARGE SUMMARY   DISCHARGE DIAGNOSES:  1. Unstable angina pectoris.  2. Coronary artery disease status post catheterization this admission      without intervention with left ventricular dysfunction/nonischemic      cardiomyopathy with an ejection fraction of 30-35%.  3. Chronic bronchitis.  4. History of pancreatitis.  5. Gastroesophageal reflux disease.  6. Status post cholecystectomy.  7. Status post hysterectomy.  8. Manic depression, bipolar disorder, schizophrenia.  9. Diabetes mellitus.  10.Diabetic neuropathy.  11.Hypertension.  12.Hypertension.  13.Obesity.  14.Positive family history of coronary artery disease.  15.Ongoing tobacco use.   This is a 60 year old African American lady who was admitted to [facility] on June 12, 2007, with a diagnosis of unstable angina.  The patient developed chest pain probably within the last 2 weeks and  the experience has been quite frequent, 1-2 episodes a week becoming  more and more intense, accompanied by shortness of breath and also  orthopnea.  She also noticed lower extremity swelling and gained weight,  probably 4 pounds or so.  She has known coronary artery disease by  previous studies.  Previous catheterization in 2004, revealed a 50%  ramus intermedius occlusion, 50% mid LAD, and also known left  ventricular dysfunction, mid anterior wall to distal inferior wall  hypokinesis which was severe.  Ejection fraction at that time was 40%.  On admission the patient was seen by Dr. Rockey Situ and the decision was to  admit her to a telemetry unit and stabilize her with nitroglycerin  and  antiplatelet agents and order an echocardiogram and get QA MARKER: 250  coronary angiography on Monday since she was admitted over the weekend.  We cycled her enzymes that revealed negative results x4 and on January  19th she underwent coronary angiography performed by Dr. Gwenlyn Found.  The  cath revealed EF 30-35%, with severe anterior wall hypokinesis and  moderate hypokinesis of the anterior wall.  Her diagonal-1 branch was  noted to have stenosis of 90% and there was a 38% stenosis of the  proximal to mid RCA.  her renal arteries were normal at that time.  The  decision was made not to intervene and to continue medical therapy.  During the cath the patient became hypoxic, tachypneic, and her blood  pressure was elevated.  The patient was placed on BiPAP, IV Lasix was  given, as well as IV nitroglycerin dose was increased.  The patient  developed flash pulmonary edema that improved clinically and  significantly after an additional dose of Lasix.  The patient's blood pressure continued to be elevated and on the day of  discharge we added clonidine to her medication regimen.  Otherwise, her  clinical status continued  to improve.  Shortness of breath was resolved  and she was considered to be stable for discharge home on January 23rd.   HOSPITAL LABORATORY:  CBC revealed a hemoglobin of 11, hematocrit 32.2,  white blood cell count 9.7, platelets 273.  Sodium 137, potassium 4.1,  chloride 107, CO2 28, BUN 19, creatinine 1.2, glucose 237.  As I  mentioned, her cardiac enzymes were negative x4.  TSH was  1, 4/10.  Total cholesterol 220, triglycerides 145, HDL 37, LDL 154.   DISCHARGE MEDICATIONS:  1. Insulin NovoLog 70/30, 70 units in the a.m. and 30 units in the      p.m.  2. Aspirin 325 mg daily.  3. Lisinopril 20 mg daily.  4. Furosemide 40 mg daily.  5. Benadryl 50 mg q.h.s.  6. Simvastatin 80 mg daily.  7. Lopressor 100 mg b.i.d.  8. Sulindac 200 mg b.i.d.  9. Sertraline 100 mg  daily.  10.Trazodone 150 mg daily.  11.Perphenazine 8 mg  as before.  12.Avandia 4 mg b.i.d.  13.Potassium chloride 10 mEq daily.  14.Metoclopramide 10 mg q.i.d.  15.Prevacid 30 mg daily.  16.Ambien as before.  17.BiDil 20/37.5 mg t.i.d.  18.Digoxin 0.125 mg daily.  19.Felodipine 10 mg daily.  20.Clonidine 0.1 mg b.i.d.   DISCHARGE FOLLOWUP:  The patient will be seen in our office by Dr. Gwenlyn Found  in 2 weeks.      York Grice, P.A.      Minna Merritts, MD  Electronically Signed    MK/MEDQ  D:  06/18/2007  T:  06/18/2007  Job:  [id number]   cc:   [facility]  Quay Burow, M.D.

## 2010-10-11 NOTE — Discharge Summary (Signed)
NAMEALVAH, DUTKIEWICZ NO.:  [id number]   MEDICAL RECORD NO.:  [id number]          PATIENT TYPE:  INP   LOCATION:  [id number]                         FACILITY:  [facility]   PHYSICIAN:  Quay Burow, M.D.   DATE OF BIRTH:  1951-05-18   DATE OF ADMISSION:  09/03/2007  DATE OF DISCHARGE:  09/08/2007                               DISCHARGE SUMMARY   HISTORY OF PRESENT ILLNESS:  Ms. Robin [name] is a 60 year old African  American female patient with known LV dysfunction, coronary artery  disease with an EF of 35%, chronic bronchitis, obesity, hypertension,  and diabetes.  She was seen in the ER secondary to cough, shortness of  breath, and PND.  She was admitted and put on IV diuresis and also given  antibiotics.  Over the next several days, her medications were adjusted.  By the September 07, 2007, she had put out 6900 mL of fluid.  Her rate was  improving and by September 08, 2007, her chest x-ray showed near complete  resolution of previously noted interstitial prominence and bilateral  effusion, so she was considered stable for discharge home.   LAB DATA:  Hemoglobin 10.7; hematocrit 31.5; WBCs 10.9, on admission, it  was 14.1; and platelets 276. D-dimer was 1.43.  Sodium 140, potassium  3.7, glucose 242, creatinine 1.08, and BUN 16.  Total protein was 6.2,  albumin 3, AST was 40, and ALT was 62.  On admission, her AST was 60,  ALT was 79.  CK-MB and troponins were negative.  Her CKs were elevated,  but not her MBs.  Total cholesterol was 106, triglycerides 140, HDL was  30, and LDL was 48.  TSH was 1.624.  Iron was 54.  B12 is 65, RBCs was  963.  X-ray on September 03, 2007, showed continued pulmonary edema with  increase in her small right pleural effusion and no change in the labs;  on September 06, 2007, interval decrease bilateral pleural effusion and  interstitial prominent edema; and on September 08, 2007, cardiomegaly and  near complete resolution of previously noted interstitial  bilateral  pleural effusions.   DISCHARGE MEDICATIONS:  1. Isosorbide dinitrate 20 mg two times per day.  2. Amlodipine 10 mg a day.  3. Lyrica 50 mg at bedtime.  4. Sertraline 100 mg every day.  5. Reglan 10 mg before meals and at bedtime.  6. Diclofenac 75 mg twice a day.  7. Levemir 25 units subcu at bedtime.  8. Flexeril 10 mg two times per day.  9. Digoxin 0.125 mg twice per day.  10.Neurontin 300 mg twice per day.  11.Simvastatin 40 mg at bedtime.  12.Trilafon 32 mg at bedtime.  13.Aspirin 81 mg two per day.  14.Hydralazine 50 mg two times per day.  15.Clonidine 0.2 mg twice per day.  16.Omeprazole 20 mg a day.  17.Avapro 300 mg a day.  18.NovoLog insulin 70/30, 70 units in the morning, 30 units in the      p.m.  19.Furosemide 40 mg twice per day.  20.Coreg 25 mg twice per day and she should  stop taking her metoprolol      on hold her simvastatin and potassium.  She will follow up with Dr.      Gwenlyn Found in 1-2 weeks.  She should call our office for an appointment.      She should have less than 2 g per day of sodium, and less than 1.5      liters of fluid per day.   DISCHARGE DIAGNOSES:  1. Pulmonary edema by chest x-ray, September 06, 2007.  2. Acute on chronic exacerbation of bronchitis, treated with      antibiotics finished today for 5 days.  3. Nonischemic cardiomyopathy with an ejection fraction of 30%-35%.  4. Coronary artery disease.  Last cath January 2009, she had a 90%      small diagonal-1 that is amenable for intervention, RCA of 30%.  5. Chronic obstructive pulmonary disease.  6. Hypertension.  7. Elevated D-dimer with negative CT scan, and negative lower      extremity Dopplers.  8. Insulin-dependent diabetes mellitus.  She had a hypoglycemic event      in the hospital, probable noncompliance with diet.  9. Obstructive sleep apnea.      Cyndia Bent, N.P.      Quay Burow, M.D.  Electronically Signed    BB/MEDQ  D:  10/01/2007  T:  10/02/2007   Job:  [id number]   cc:   Melburn Hake, M.D.

## 2010-10-11 NOTE — H&P (Signed)
Robin [name], SATURDAY NO.:  [id number]   MEDICAL RECORD NO.:  [id number]          PATIENT TYPE:  IPS   LOCATION:  0407                          FACILITY[facility]   PHYSICIAN:  Rulon Eisenmenger, M.D. DATE OF BIRTH:  03-10-1951   DATE OF ADMISSION:  10/24/2004  DATE OF DISCHARGE:                         PSYCHIATRIC ADMISSION ASSESSMENT   IDENTIFYING INFORMATION:  This patient is a 60 year old African-American  female who is voluntarily admitted to the [name] on October 24, 2004.   HISTORY OF PRESENT ILLNESS:  The patient has a history of bipolar disorder,  who has been off of her Seroquel for a month due to side effects.  She  claims it causes pancreatitis.  She presents complaining of auditory  hallucinations of whispers, as well as homicidal ideation to harm her  daughter with a machete, and suicide ideation to take pills.  These symptoms  have been ongoing for a month.  The patient also is complaining of inability  to sleep and she is paranoid that her custodial fiduciary is mishandling her  money.  She does have positive homicidal ideation, positive suicide  ideation, positive psychosis without mania.   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 Alyson Reedy in [place] at the [place].   SOCIAL HISTORY:  She lives in [place] with 2 of her grandchildren, 13-  and 55 year old granddaughters.  She is disabled.  She has 2 years of  college.   FAMILY HISTORY:  She says her brother is alcoholic who also smokes pot.  She  has 2 sisters who smoke crack cocaine.   PAST MEDICAL HISTORY:  __________ in [place] and Dr. Mare Ferrari in [place].  Her medical problems include insulin-dependent diabetes mellitus,  hypertension, hyperlipidemia, and  glaucoma.   MEDICATIONS:  1.  Quinine sulfate 325 mg at bedtime p.r.n. leg cramps.  2.  Aspirin 81 mg a day.  3.  Clonazepam 2 mg p.o. q.h.s.  4.  Lopressor 50 mg q.12h.  5.  Sertraline 100 mg daily.  6.  Zocor 20 mg at bedtime.  7.  Reglan 10 mg 4 times daily.  8.  Sulindac 150 mg twice daily.  9.  Lasix 40 mg daily.  10. Timolol  maleate 0.5% 1 drop in each eye in the morning.  11. __________ 4 mg twice daily.  12. Diazepam 10 mg at bedtime.  13. Novolin 70/30 60 units in the morning and 20 units at bedtime.  14. Clonidine 0.3 mg patch q.7 days.  15. Trazodone 50 mg at bedtime.  16. Benadryl 100 mg at bedtime as needed for insomnia.   POSITIVE PHYSICAL FINDINGS:  She is 64 inches tall and 186 pounds.   LABORATORY DATA:  CBC is within normal limits.  Her blood chemistries are  within normal limits except for her glucose reading.  Her liver function  tests are within normal  limits except for a low protein of 5.8 and a low  albumin of 3.1.  Her TSH is 0.706.  Her urine drug screen is positive for  benzodiazepines and opiates.  Her alcohol level was less than 5.   MENTAL STATUS EXAM:  Her mood was paranoid.  Her thought process was  coherent, with positive suicide ideation, positive __________ evidence of  auditory or visual hallucinations or mania were observed during the  interview.  Her cognitive function:  Her concentration is normal, her memory  is impaired, and her insight and judgment are poor.   ADMISSION DIAGNOSES:   AXIS I:  Bipolar disorder with psychotic features and paranoid features.   AXIS II:  Deferred.   AXIS III:  Insulin-dependent diabetes mellitus, hypertension, dyslipidemia,  and glaucoma.   AXIS IV:  Moderate for problems __________.   AXIS V:  Current global assessment of function of 26, past year 84.   INITIAL PLAN OF CARE:  Admit the patient voluntarily and stabilize her to  mood and thought.  We will begin Geodon.  We will resume her home   medications.  We will work to increase her coping skills and decrease her  stressors, and she can follow up with Dr. Alyson Reedy in [place]:  6-9 days.      AHW/MEDQ  D:  10/24/2004  T:  10/24/2004  Job:  [id number]

## 2010-10-11 NOTE — Discharge Summary (Signed)
Robin [name], Robin [name] NO.:  [id number]   MEDICAL RECORD NO.:  [id number]          PATIENT TYPE:  INP   LOCATION:  2005                         FACILITY:  [facility]   PHYSICIAN:  Melburn Hake, M.D.DATE OF BIRTH:  Sep 24, 1950   DATE OF ADMISSION:  02/26/2008  DATE OF DISCHARGE:  03/02/2008                               DISCHARGE SUMMARY   ADMITTING DIAGNOSES:  1. Congestive heart failure.  2. Pneumonia.  3. Cardiomyopathy and hypertension.  4. Type 2 diabetes mellitus.  5. Obstructive sleep apnea.  6. History of manic depressive.  7. The patient is bipolar.   BRIEF HISTORY AND PHYSICAL AND HOSPITAL COURSE:  Robin [name] is a 60-year-  old African American female who is a diabetic.  She has a history of  hypertension.  She came in with a progressive cough, shortness of  breath, and some fatigue.  She denied chest pain.  No nausea, vomiting,  or chills.  She has had congestive heart failure in the past.  She also  had some bronchitis in the past.  She has nonischemic cardiomyopathy.  She continues to smoke.  She is a pack-a-day smoker for over 30 years.  Smoking cessation has been tried, but failed.  The patient has COPD.  She is hypertensive and diabetic and she smokes.  She is on furosemide  40 mg a day, isosorbide dinitrate 20 mg daily, Levemir insulin 25 units  at night.  She is on Zoloft 100 mg at night and lisinopril 20 mg daily.  She takes Reglan 10 mg at night, digoxin and Lanoxin 0.125 mg b.i.d.,  baby aspirin 81 mg daily, and Trilafon 32 mg at bedtime.   PHYSICAL EXAMINATION:  VITAL SIGNS:  Blood pressure 140/84, pulse of 96,  respirations 24, and temp was 98.5.  NECK:  Veins were supple.  No JVD.  CHEST:  She has some rhonchi bilaterally.  No wheezes.  ABDOMEN:  Soft and nontender.  Active bowel sounds.  She has sebaceous  cyst in the groin next to her vaginal area with a mild purulent  drainage.   LABORATORY DATA:  White count 15,000, hemoglobin was  9.9, hematocrit 29,  and platelets 295.  INR 1.1.  Sodium 141, potassium 3.9, chloride 108,  and albumin 3.1.  BNP was 288, CK-MB, was not evident for myocardial  injury.  Chest x-ray showed left upper lobe infiltrate.   She had some pneumonia, sebaceous cyst in the left groin, COPD, and the  patient is bipolar.  She had some congestion, but not a lot of  congested, her BNP was only somewhere in 200-298.  The patient was put  to bedrest, started on Zithromax, Rocephin, and Ancef.  Abdomen was  soft.  No mass.  No organomegaly.  We continued her on Zosyn and  pneumonia, the abscess on the left groin, we packed it and dressed it  clean.  The patient was very constipated, so we did a barium enema and  upper GI series, which both proved to be negative.  Repeat x-ray showed  improvement in the patchy infiltrates of  the pneumonia.  The patient was  breathing much better at the time of discharge.  She was not in that  much heart failure and mostly had pneumonia which was improving.   DISCHARGE MEDICATIONS:  1. Lasix 20 mg a day.  2. Coreg 25 mg b.i.d.  3. Isosorbide 20 mg b.i.d.  4. Lisinopril 20 mg daily.  5. Norvasc 10 mg.  6. NovoLog insulin sliding scale.  7. Lantus 25 mg at night.  8. We continued her on Lanoxin 0.125 mg daily.  9. Mucinex 600 mg b.i.d. for cough.  10.Zocor 80 mg daily.  11.Zoloft 100 mg.  12.Trilafon 32 mg at night.  13.Xanax 0.25 mg daily for mood disorder and to help with her bipolar      disease.  14.The patient is on Ventolin inhaler.  15.We have given her nicotine patch to help her to stop smoking.  16.We will continue her on Avelox 400 mg p.o. daily for another 8 days      for pneumonia and the abscess on her leg.   She got a flu shot and to see her in my office in 2 weeks.  Follow up  with cardiology.  She is go to the [place] sometime later this month.  The  patient will follow up in the office.           ______________________________  Melburn Hake, M.D.     CEF/MEDQ  D:  05/01/2008  T:  05/02/2008  Job:  [id number]

## 2010-10-11 NOTE — Cardiovascular Report (Signed)
NAME:  Robin [name], Robin [name]                            ACCOUNT NO.:  [id number]   MEDICAL RECORD NO.:  [id number]                   PATIENT TYPE:  INP   LOCATION:  6525                                 FACILITY:  [facility]   PHYSICIAN:  Sinclair Grooms, M.D.            DATE OF BIRTH:   DATE OF PROCEDURE:  10/06/2002  DATE OF DISCHARGE:                              CARDIAC CATHETERIZATION   INDICATIONS FOR PROCEDURE:  Admitted with prolonged arm and neck discomfort,  atypical for angina.  She had been previously scheduled to have a coronary  angiogram based on an abnormal Cardiolite study that demonstrated a fixed  mid to distal inferior wall defect and demonstrated decreased overall LV  function with an EF of 42%.  This study is being done to define anatomy and  help exclude high risk coronary artery disease.   PROCEDURE PERFORMED:  1. Left heart catheterization.  2. Selective coronary angiography.  3. Left ventriculography.  4. Intravascular ultrasound of the ramus intermedius branch.  5. Cutting balloon angioplasty of the ramus intermedius branch.   DESCRIPTION:  After informed consent, a 6-French sheath was placed in the  right femoral artery using the modified Seldinger technique.  A 6-French AT  multipurpose catheter was used for hemodynamic recordings, left  ventriculography and selective left and coronary angiography.  We also used  a 6 [place] #4 left Judkins catheter for left coronary angiography and  installation of 200 mcg of intracoronary nitroglycerin.  After review of the  scintiangiograms, it was felt that the ramus branch probably supplied blood  flow into the region of the LV that was most abnormal on Cardiolite and also  demonstrated abnormalities on wall motion during this angiogram.  Because  the lesion in the ramus appeared only borderline, intravascular ultrasound  was performed.  This demonstrated widely patent mid vessel, narrowed  proximal segment with a focal  region within this narrowed segment that  appeared to contain soft material, possibly plaque, that was nearly  obstructive.   Because of the appearance of the intravascular ultrasound and also the  location of the lesion with reference to the left main, circumflex and LAD,  it was felt that cutting balloon angioplasty would be a reasonable  alternative for managing this lesion.   BMW wire and 6 French #4 left Judkins guide catheter were used.  A double  bolus followed by an infusion of Integrelin.  5000 units of IV heparin had  been administered.  The ACT was documented to be 330 seconds.  Angioplasty  was performed using a 10 mm long x 2.75 mm cutting balloon to 8 atmospheres.  Two balloon inflations were performed.  Interestingly, with vessel occlusion  there were no symptoms reminiscent of those that the patient had upon  admission.  She actually had no chest discomfort at all with two balloon  inflations totally 2 minutes and 30 seconds.  There  were no ST-T  wave  changes noted.  Procedure was terminated.  TIMI-3 flow was noted and the angiographic result was felt to acceptable.  Stenting was not performed because of the likelihood of free stent in the  left main.  ACT postprocedure 302 seconds.   RESULTS:   I. HEMODYNAMIC DATA:  A.  Aortic pressure 141/86.  B.  Left ventricular pressure 141/21.   II. LEFT VENTRICULOGRAPHY:  The left ventricle demonstrates mid inferior  wall severe hypokinesis, EF is estimated to be approximately 40%.  No MR.   III. CORONARY ANGIOGRAPHY:  A.  Left main coronary:  The left main coronary  artery is widely patent.  There is mid 50% narrowing in the LAD.  The LAD  wraps around the left ventricular apex.  B.  Ramus coronary artery:  The ramus contains segmental 50 to 70% narrowing  depending upon the view used.  The ramus is large and bifurcates on the  inferolateral and distal inferior wall.  C.  Circumflex artery:  The circumflex artery is large.   Gives origin to  three obtuse marginal branches.  The distal obtuse marginal branch  bifurcates on the inferior wall.  No significant obstruction is noted in the  circumflex territory.  D.  Right coronary:  The right coronary gives the PDA.  It supplies the AV  nodal artery.  No  significant obstruction is seen.   IV. INTRAVASCULAR ULTRASOUND:  Intravascular ultrasound was carried out on  the ramus branch.  The distal reference lumen diameter was 2.9 x 2.8 mm in  diameter.  The lesion reference diameter in the very distal portion of the  lesion transition into normal vessel size contained soft material that was  nearly obstructed.  The lesion measurements proximal to this area were 1.8 x  2.1 with the vessel being loaded with soft plaque.   V. PERCUTANEOUS CORONARY INTERVENTION:  PCI was performed with cutting  balloon 2.75 x 10 mm long balloon, two inflations to 8 atmospheres.  Total  balloon occlusion time approximately 1 minute 36 seconds.  No discomfort  occurred during the balloon inflations.  No ST-T wave changes.   CONCLUSIONS:  1. Significant coronary artery disease involving predominantly the ramus     intermedius branch.  The ramus proximal stenosis was felt to be 70% by     angiographic evaluation.  It was in this same general level of     obstruction by intravascular ultrasound with the additional finding of     near total lumen compromise around the catheter and one spot at the very     distal margin of the lesion with soft material, possibly thrombus.  2. Successful cutting balloon angioplasty with reduction of stenosis from 70     to less than 50% with TIMI-3 flow.  Balloon occlusion did not reproduce     the patient's admitting symptoms nor did it produce any symptoms     suggesting the possibility either of silent ischemia in this diabetic or     previously infarcted territory.  3. 50% mid left anterior descending lesion. 4. Left ventricular dysfunction with mid  inferior wall to distal inferior     wall severe hypokinesis.  Overall ejection fraction 40%.   PLAN:  1. Aspirin and Plavix for one month, then aspirin alone.  2. Continue other medications as listed including ACE or ARB therapy given     her diabetes.  Sinclair Grooms, M.D.    HWS/MEDQ  D:  10/06/2002  T:  10/07/2002  Job:  [id number]

## 2010-10-11 NOTE — H&P (Signed)
[place]  Patient:    SRAH, CAYTON                         MRN: [id number] Adm. Date:  [id number] Disc. Date: [id number] Attending:  Chucky May [facility]                   Psychiatric Admission Assessment  IDENTIFYING DATA:  Dmiyah is a 60 year old divorced African-American female with a history of bipolar/schizoaffective disorder of nine years.  She doing well until about a month ago when she began to decompensate.  She felt depressed, hopeless, and helpless, and she had suicidal thoughts with a plan and also had command hallucinations, and hence it was felt necessary for patient to he hospitalized.  PAST PSYCHIATRIC HISTORY:  Nine year history of psychiatric issues.  History of psychiatric admissions and one suicide attempt.  SOCIAL HISTORY:  She is divorced.  She is disabled.  She is Radiographer, therapeutic.  FAMILY HISTORY:  None.  ALCOHOL AND DRUG HISTORY:  None.  PAST MEDICAL HISTORY:  She is insulin-dependent diabetic and has hypertension.  DRUG ALLERGIES:  None.  PHYSICAL EXAMINATION:  Patient had a complete physical examination by Dr. Anson Fret, her primary care physician, about a week ago.  MENTAL STATUS EXAMINATION:  Patient was fully alert and oriented.  Affect was flat.  There was no eye contact.  She was coherent, logical, goal directed, depressed, hopeless, helpless, worthless, suicidal, admitted to delusions of paranoia and auditory hallucinations, command in nature.  She was unable to do the memory tests, and as she did not want to talk any further.  IMPRESSION: Axis I:     Schizoaffective disorder/bipolar disorder, depressed, with             psychotic features, current episode severe. Axis II:    Deferred. Axis III:   Hypertension, insulin-dependent diabetes mellitus. Axis IV:    Chronic mental illness, noncompliance with medications. Axis V:     30/60.  HOSPITAL COURSE:  I will admit her to [facility].  She will take  part in individual and group psychotherapy.  I will discontinue Prolixin and Risperdal.  She has been taking three antipsychotics and Dr. Anson Fret will be consulted to manage her diabetes and hypertension.  Family meeting will be done and she will be advised outpatient follow up. DD:  03/12/00 TD:  03/12/00 Job: [id number]

## 2010-10-11 NOTE — Discharge Summary (Signed)
[place]  Patient:    Robin [name], Robin [name] Visit Number: [id number] MRN: [id number]          Service Type: EMS Location: [facility] Attending Physician:  Lajean Saver Dictated by:   Rulon Eisenmenger, M.D. Admit Date:  04/09/2001 Discharge Date: 04/09/2001                             Discharge Summary  IDENTIFYING DATA:  The patient is a 60 year old African-American female, divorced, voluntarily admitted, transferred from the medicine service at [facility] where she was admitted for chest pain and rule out a myocardial infarction.  She was also quite paranoid and agitated.  After restarting medications, these symptoms appeared to improve.  MEDICATIONS ON TRANSFER:  1. Avandia 4 mg b.i.d.  2. Protonix 40 mg q.d.  3. Klonopin 3 mg q.h.s.  4. Valium 10 mg t.i.d.  5. Seroquel 800 mg q.h.s.  6. Trazodone 100 mg q.h.s.  7. Prinivil 10 mg q.d.  8. Zoloft 50 mg q.d.  9. Premarin 0.625 mg q.d. 10. Lasix 20 mg q.d. 11. Humulin 70/30 60 units q.a.m. and 20 units at 12 p.m. and 20 units at 6     p.m. 12. Reglan 20 mg 20 minutes before each meal and in the evening. 13. Lactulose 2 Tbs every morning with her coffee.  ALLERGIES:   No known drug allergies.  PHYSICAL EXAMINATION:  GENERAL:  Essentially within normal limits.  VITAL SIGNS:  Mildly elevated blood pressure at 147/87.  NEUROLOGIC:  Grossly intact and nonfocal.  LABORATORY DATA:  Glucose elevated at 245.  Hemoglobin A1c 9.6.  There was no evidence of an acute cardiac event based on enzymes.  Amylase and lipase were within normal limits.  Thyroid panel: Within normal limits.  MENTAL STATUS EXAMINATION:  She is a casually dressed, obese African-American female, alert and oriented x 3, relaxed, cooperative.  Speech:  Within normal limits.  Mood: Fairly euthymic, occasionally irritable.  Thought process: Goal directed.  Thought content: Negative for psychotic symptoms.  Auditory hallucinations  resolved over the last 24 hours.  No signs of delusions or paranoia.  Cognitive: Intact.  Judgment and insight: Fair.  ADMITTING DIAGNOSES: Axis I:    Schizoaffective disorder, acute exacerbation. Axis II:   None. Axis III:  1. Diabetes mellitus type 2.            2. History of constipation.            3. Tobacco abuse.            4. History of pancreatitis. Axis IV:   Severe with financial constraints, medical problems, and difficulty            getting medications. Axis V:    40/70.  HOSPITAL COURSE:  The patient and prescribed routine p.r.n. medications, individual, group, and milieu therapy, and medically monitored.  Medications were resumed.  Valium and Klonopin were initially decreased due to what appeared to be oversedation and gradually titrated as tolerated.  The patient initially was somewhat sedated and withdrawn, then became more appropriate and active and showed no problematic behavior, reported resolution of psychotic symptoms and improvement in mood, tolerated medications without side effects.  CONDITION AT DISCHARGE:  Improved but not recovered.  Mood was mostly euthymic.  Affect: Still somewhat restricted.  Thought process: Goal directed. Thought content: Negative for psychotic symptoms, no suicidal, homicidal, or violent ideation, no agitation, irritability, or problematic behavior.  Judgment and insight had improved.  She reported motivation to be compliant with follow up with Dr. Toy Care.  DISPOSITION:  The patient was to follow up with Dr. Toy Care on October 31 at 3 p.m.  Follow up with her own PA for a medical evaluation including cataracts evaluation.  DISCHARGE DIAGNOSES: Axis I:    Schizoaffective disorder, acute exacerbation. Axis II:   None. Axis III:  1. Diabetes mellitus type 2.            2. History of constipation.            3. Tobacco abuse.            4. History of pancreatitis. Axis IV:   Severe with financial constraints, medical problems, and  difficulty            getting medications. Axis V:    Global assessment of functioning on discharge was 75.  DISCHARGE MEDICATIONS:  She was discharged with prescriptions for medications:  1. Avandia 4 mg two times per day.  2. Prinivil 10 mg q.a.m.  3. Premarin 0.625 mg q.a.m.  4. Seroquel 200 mg four q.h.s.  5. Trazodone 100 mg q.h.s.  6. Zoloft 50 mg q.a.m.  7. Lasix 20 mg q.a.m.  8. Reglan 10 mg t.i.d. and q.h.s.  9. Claritin 10 mg q.a.m. 10. Lactulose 30 mg q.a.m. 11. Klonopin 2 mg q.h.s. 12. Valium 10 mg t.i.d. 13. Protonix 40 mg b.i.d. 14. Insulin Humulin 70/30 60 units q.a.m., 20 units at 12 p.m., and 20 units     at 5 p.m., holding if CBG is less than 150.  DISCHARGE INSTRUCTIONS:  The patient was placed on a diabetic diet 1800 kilocalorie, advised not to drive if sedated. Dictated by:   Rulon Eisenmenger, M.D. Attending Physician:  Lajean Saver DD:  04/14/01 TD:  04/15/01 Job: [id number]

## 2010-10-11 NOTE — Discharge Summary (Signed)
Robin [name], Robin [name]                            ACCOUNT NO.:  [id number]   MEDICAL RECORD NO.:  [id number]                   PATIENT TYPE:  INP   LOCATION:  5506                                 FACILITY:  [facility]   PHYSICIAN:  Melburn Hake, M.D.            DATE OF BIRTH:  Dec 13, 1950   DATE OF ADMISSION:  10/27/2003  DATE OF DISCHARGE:  10/31/2003                                 DISCHARGE SUMMARY   ADMITTING DIAGNOSES:  1.  Type 2 diabetes.  2.  Hypertension.  3.  Neurological manifestations of diabetes.  4.  Hypoglycemia.  5.  Hypokalemia.   HISTORY/PHYSICAL/HOSPITAL COURSE:  This is a 60 year old African American  female with type 2 diabetes, but she is on insulin.  She has hypertension.  She has chronic manic/depressive symptoms.  She was brought to the emergency  room early in the morning sleepy, difficult to arouse.  She was found to  have a glucose of 37.  It was unusual because she generally has a very  elevated glucose.  Glucose blood sugars were very difficult to control.  There is a family history for diabetes, hypertension, and heart disease.  Mother died of diabetes and heart disease.  Her father also had diabetes.  The patient lives with her friend and sometimes takes care of her  grandchildren.   She has a history of:  1.  Acute pancreatitis several years ago.  2.  __________  reflux esophagitis.  3.  Diabetic gastroparesis.   PHYSICAL EXAMINATION:  VITAL SIGNS:  Temp was 97.9, pulse was 61,  respirations 18, blood pressure 112/68, and O2 sat was 99%.  HEENT:  Negative.  CHEST:  Clear to auscultation and percussion.  HEART:  She had a regular sinus rhythm.  ABDOMEN:  Soft with active bowel sounds.  No masses, no organomegaly.  EXTREMITIES:  The patient moved all of her extremities.   As we increased her blood sugars up to 190, she became more alert and  responsive.  She claimed that she was constipated, had not had a bowel  movement in three days.  The  patient is bipolar.  She is on several  medications, and she is being followed by psychiatry, Dr. Toy Care.  She is also  on Humulin 70/30, 65 units in the a.m. and Lantus 10 units at night.  She  stopped her psych medicines.  She states that she only wants to drink.  We  gave the patient an enema, and she had a bowel movement.  She felt better.  We put her on MiraLax, some prune juice, and she said coffee did not seem to  help her.  We decided to give her a script for MiraLax and keep her on that.  Continue the Lantus 10 units at night, and she is on Humulin 70/30, 62 units  in the morning, and also Humalog sliding scale at home.  Continued her on  Zoloft 100 mg at night and Zyprexa 2.5 mg at night.  She stated that she did  not want to use many of the psychiatric medications, so was given also  quinine 325 mg at night and amitriptyline for peripheral diabetic neuropathy  25 mg at night.  Clinoril 200 mg b.i.d.  Lasix 40 mg daily.  Catapres 0.2 mg  b.i.d. and an 81 mg coated aspirin once a day.  She is taking Claritin 10 mg  daily and Zestril, Prinivil 40 mg daily and MiraLax 17 grams in a glass of  water daily.  Lantus 10 units at night.  Prevacid 30 mg daily and Reglan 10  mg a.c. and h.s.  She is also on Zocor 40 mg a day.  She is also trying to  stop smoking and uses the Nicoderm patches.  The patient is discharged home.   She is to come to the office in two weeks.                                                Melburn Hake, M.D.    CEF/MEDQ  D:  02/09/2004  T:  02/09/2004  Job:  [id number]

## 2010-10-11 NOTE — Discharge Summary (Signed)
Robin [name], Robin [name] NO.:  [id number]   MEDICAL RECORD NO.:  [id number]          PATIENT TYPE:  IPS   LOCATION:  0407                          FACILITY:  [facility]   PHYSICIAN:  Rulon Eisenmenger, M.D. DATE OF BIRTH:  03-Jan-1951   DATE OF ADMISSION:  10/24/2004  DATE OF DISCHARGE:  11/02/2004                                 DISCHARGE SUMMARY   IDENTIFYING DATA:  This is a 60 year old African-American female with a  history of bipolar disorder.  Off of Seroquel for one month due to side  effects.  Apparently had pancreatitis.  Complained of auditory  hallucinations, whispers and homicidal ideation to harm neighbor and  fleeting thoughts of harming daughter with a machete as well as a suicidal  thought to take pills, which she has on and off in the past.  Complaining of  inability to sleep and paranoid regarding custodial fiduciary mishandled  money.  Positive suicidal and homicidal ideation and psychosis.   MEDICATIONS:  __________ tabs.   ALLERGIES:  No known drug allergies.   PHYSICAL EXAMINATION:  Physical and neurologic exam within normal limits.  Performed at [facility].   MENTAL STATUS EXAM:  Alert and oriented.  Good eye contact.  Guarded affect.  Disheveled.  Calm and cooperative.  Speech clear, even tone.  Mood paranoid.  Thought processes goal directed.  Positive suicidal ideation and homicidal  ideation.  Cognitively grossly intact with impaired insight and judgment was  quite poor.   ADMISSION DIAGNOSES:   AXIS I:  Bipolar disorder with psychotic features.   AXIS II:  Deferred.   AXIS III:  1.  Insulin-dependent diabetes mellitus.  2.  Hypertension.  3.  Dyslipidemia.  4.  Glaucoma.   AXIS IV:  Moderate (stressors of primary support group, education,  occupation problems, medical problems).   AXIS V:  26/55.   HOSPITAL COURSE:  The patient was admitted and ordered routine p.r.n.  medications and underwent further monitoring.  Was  encouraged to participate  in individual, group and milieu therapy.  The patient was begun on Geodon  and resumed on home medications.  Monitored medically, very closely due to  multiple medical problems.  The patient was noncompliant with eating, would  collect food in her room.  Was followed up closely by [facility] hospitalist  regarding medical status and all recommendations were followed.  The patient  was worked up for any kind of reversible medical etiology for  psychopathology as well as stabilized on medications and appropriate  aftercare planning was developed and patient was discharged in improved  condition.  The patient reporting that she was ready to go.   CONDITION ON DISCHARGE:  Mood was euthymic at the time of discharge.  Affect  bright.  The patient denied any psychotic symptoms, dangerous ideation.  Had  been sleeping or eating well.  Appropriate on the unit.  The patient was  given medication education along with the family and support system and  patient was discharged with consistent medications with equal  recommendations as internal medicine consultation had been obtained and  followed patient throughout hospitalization.   DISCHARGE MEDICATIONS:  1.  Avandia 4 mg, to take as previously prescribed.  2.  Lopressor 50 mg twice a day.  3.  Aspirin 81 mg daily.  4.  Zocor 20 mg at bedtime.  5.  Reglan 10 mg four times a day.  6.  Clinoril 200 mg twice a day.  7.  Catapres 0.25 mg three times a day.  8.  Prinivil 40 mg, 2 per day.  9.  Insulin NPH 45 units, 25 units and 25 units, at 7 a.m., 12 and 5 p.m.,      respectively.  10. Zoloft 50 mg daily.  11. Cogentin 0.5 mg at 6 p.m.  12. The patient also discharged on Ambien 10 mg q.h.s. p.r.n.  13. Trazodone 100 mg q.h.s. p.r.n.  14. Klonopin 1 mg, 1 twice a day and at night.  15. Lantus insulin 24 units at bedtime.  16. Quinine sulfate 325 mg at bedtime as needed for cramps.  17. Artificial tears in each eye.  18.  Norvasc 5 mg daily.  19. Lasix 40 mg, up to 2 per day.  20. Geodon 60 mg daily with close monitoring of electrolytes and all medical      labs related to medical conditions.  21. Lamictal 25 mg daily.   The patient was advised regarding risk of rash and to stop medications if  this occurred and to call doctor immediately and also risk with Geodon  including TD, EPS and QTC prolongation.  The patient tolerated medications  well without side effects and was discharged, again, in improved condition  with no dangerous ideation.  Mood was euthymic.  Affect brighter.  Improved  judgment and insight.   FOLLOW UP:  The patient was to follow up with [facility] on  Monday, June 12.   DISCHARGE DIAGNOSES:   AXIS I:  Bipolar disorder with psychotic features.   AXIS II:  Deferred.   AXIS III:  1.  Insulin-dependent diabetes mellitus.  2.  Hypertension.  3.  Dyslipidemia.  4.  Glaucoma.   AXIS IV:  Moderate (stressors of primary support group, education,  occupation problems, medical problems).   AXIS V:  Global Assessment of Functioning on discharge 50-55.       JEM/MEDQ  D:  12/12/2004  T:  12/12/2004  Job:  [id number]

## 2010-10-11 NOTE — H&P (Signed)
Robin [name], Robin [name]                            ACCOUNT NO.:  [id number]   MEDICAL RECORD NO.:  [id number]                   PATIENT TYPE:  INP   LOCATION:  6525                                 FACILITY:  [facility]   PHYSICIAN:  Belva Crome, M.D.                DATE OF BIRTH:  [birth year]   DATE OF ADMISSION:  10/06/2002  DATE OF DISCHARGE:                                HISTORY & PHYSICAL   ADMISSION DIAGNOSES:  1. Chest pain, rule out myocardial infarction.  2. Recent abnormal Cardiolite - scheduled for elective catheterization next     week.  3. Hypertension.  4. Insulin-dependent diabetes mellitus.  5. Family history of heart disease.  6. Obesity.  7. Hyperlipidemia.  8. Chronic bronchitis.  9. Manic-depressive illness with bipolar disease.   HISTORY OF PRESENT ILLNESS:  The patient is a 60 year old African-American  female patient who has been followed by Dr. Tamala Julian in the office for  evaluation of atypical chest pain.  She presented to the emergency room last  night with complaints of chest pain.  She states she was preparing to go to  bed when she experienced left arm pain, shoulder pain, and chest pain.  The  discomfort would come and go.  She had associated headache, shortness of  breath, and nausea.  EKG in the emergency room showed normal sinus rhythm  with nonspecific ST changes.  First cardiac enzymes were negative.  The  patient will be admitted for chest pain, rule out MI.  Since she is  scheduled to have a heart catheterization next week with Dr. Tamala Julian we will  go ahead and proceed with n.p.o. status tonight in the event cardiac  catheterization will be performed in the morning by Dr. Tamala Julian.   ALLERGIES:  No known drug allergies.   MEDICATIONS:  1. Clonidine 0.2 mg b.i.d.  2. Lisinopril 40 mg b.i.d.  3. Zocor 20 mg q.h.s.  4. Lasix 20 mg a day.  5. Zoloft 200 mg in the morning.  6. Clonazepam 2 mg at bedtime.  7. Trazodone 300 mg at bedtime.  8. Quetiapine  fumarate 800 mg q.h.s.  9. Humulin 70/30 72 units in the morning and 30 units in the evening.  10.      Claritin b.i.d.  11.      Avandia 4 mg b.i.d.   PAST MEDICAL HISTORY:  1. Diabetes, insulin dependent.  2. Hypertension.  3. Hyperlipidemia.  4. Chronic bronchitis.  5. Manic-depressive illness with bipolar disease.  6. Hysterectomy.  7. Cholecystectomy.  8. History of pancreatitis.  9. Obesity.   FAMILY HISTORY:  Father died at 1.  Mother died at 50 of complications of  diabetes and heart disease.  Sister has heart disease and prior MI.  Brother  has high blood pressure and diabetes.  Another brother has high blood  pressure and diabetes.  The younger brother has  history of CAD with stent.  One sister is HIV positive.   SOCIAL HISTORY:  The patient smokes a pack of cigarettes per day for greater  than 30 years.  She does not drink alcohol and does not use illicit drugs.  She has two daughters.   REVIEW OF SYSTEMS:  See HPI.  The patient denies melena, hematuria, or  dysuria.  Denies black tarry stool.  Does have a history of reflux.   PHYSICAL EXAMINATION:  (Performed by Dr. Jaci Standard on admission)  VITAL SIGNS:  Blood pressure 110/70, pulse 80, SAO2 99% on 2 liters.  GENERAL:  The patient is alert and oriented x3 in no acute distress.  HEENT:  Normocephalic, atraumatic.  NECK:  Supple without bruits or masses.  LUNGS:  Clear to auscultation.  CARDIOVASCULAR:  Regular rate and rhythm without murmurs, gallops, or rubs.  ABDOMEN:  Obese, soft, nontender, nondistended.  EXTREMITIES:  Without edema.  Distal pulses intact.  NEUROLOGIC:  Nonfocal.  Mentation intact.   IMPRESSION:  1. Chest pain - somewhat atypical, rule out myocardial infarction.  2. Abnormal Cardiolite recently - will obtain office records.  3. Hypertension.  4. Hyperlipidemia.  5. Diabetes.  6. Obesity.  7. Bipolar disorder.   PLAN:  Will admit.  Cycle cardiac enzymes.  IV nitroglycerin for chest  pain.  Lovenox per pharmacy.  Will review with Dr. Tamala Julian and post for cardiac  catheterization.     Dellia Nims Jernejcic, P.A.                   Belva Crome, M.D.    TCJ/MEDQ  D:  10/06/2002  T:  10/06/2002  Job:  [id number]

## 2010-10-11 NOTE — Consult Note (Signed)
. [facility]  Patient:    Robin [name], Robin [name] Visit Number: [id number] MRN: [id number]          Service Type: EMS Location: [facility] Attending Physician:  Rolland Porter Dictated by:   Elvia Collum, M.D. Proc. Date: 10/26/01 Admit Date:  10/26/2001 Discharge Date: 10/26/2001                            Consultation Report  DATE OF BIRTH:  02/09/1951  REQUESTING PHYSICIAN:  Melburn Hake, M.D./[facility]  REASON FOR EVALUATION:  Left-sided weakness and numbness.  HISTORY OF PRESENT ILLNESS:  This is the initial emergency room consultation evaluation of this 60 year old woman with multiple medical problems who says that about 10:00 she reportedly had a "numb" sensation involving the left side including the arm and the leg and the area in the abdomen "around my pancreas."  She said that she tried to stand up and felt dizzy.  She also had some nausea and abdominal pain.  She was brought to the emergency room and underwent CT of the head which was negative.  Neurologic examination in the emergency room demonstrated questionable effort.  Patient was examined at approximately 7 p.m.  She says she is feeling better now but numbness persists and she still continues to have abdominal pain.  PAST MEDICAL HISTORY:  Hypertension, diabetes, history of pancreatitis, bipolar disorder.  MEDICATIONS:  Numerous, but include insulin, Zestril, Lipitor, clonidine, and Lasix among others.  REVIEW OF SYSTEMS:  She was complaining of a left-sided headache.  Says she has "very little" chest pain.  She is having nausea, vomiting, diarrhea, and abdominal pain as above.  PHYSICAL EXAMINATION  VITAL SIGNS:  Temperature 97.2, blood pressure 155/97, pulse 88, respirations 16.  GENERAL:  She is alert, in no evident distress.  Speech is fluent and not dysarthric.  Mood is euthymic and affect appropriate.  NECK:  Supple without carotid  bruit.  HEART:  Regular rate and rhythm without murmurs.  NEUROLOGIC:  Pupils are equal and briskly reactive.  Extraocular movements are normal without nystagmus.  Face, tongue, and palate move normally and symmetrically.  Motor:  Normal bulk and tone.  There is giveaway weakness on the left but she gives at least brief full strength in all tested extremity muscles.  Sensation:  She reports decreased pin prick sensation on the left side and demonstrates splitting of the midline on the face.  Reflexes are 2+ and symmetric.  Toes are downgoing.  Finger-to-nose is performed well.  LABORATORY REVIEW:  MRI of the brain is performed in the emergency room and is personally reviewed.  It demonstrates no acute abnormality.  IMPRESSION:  Left-sided weakness and numbness not neurologic in nature.  PLAN:  Per emergency room physician.  Would suggest she be on an aspirin a day if no contraindication.  No need for neurologic follow-up. Dictated by:   Elvia Collum, M.D. Attending Physician:  Rolland Porter DD:  10/26/01 TD:  10/28/01 Job: [id number]
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
## 2010-10-11 NOTE — Consult Note (Signed)
NAMEDENESHIA, [name] NO.:  [id number]   MEDICAL RECORD NO.:  [id number]          PATIENT TYPE:  IPS   LOCATION:  0407                          FACILITY:  [facility]   PHYSICIAN:  Jerelene Redden, MD      DATE OF BIRTH:  08-Jul-1950   DATE OF CONSULTATION:  DATE OF DISCHARGE:                                   CONSULTATION   IDENTIFYING INFORMATION:  Robin [name] is a 60 year old lady who apparently  was admitted to [facility] yesterday because of paranoid behavior and  making threats to family members.  Currently, the patient is somewhat  lethargic presumably because of medications.  She admits that she ran out of  at least some of her medications nine days ago. It is somewhat difficult to  determine which of these she had ran out of at home.  She has not been  monitoring her blood sugars.  We are asked to comment about her diabetic and  blood pressure treatment.   Problem 1. Uncontrolled diabetes.  Robin [name] has a longstanding history of  diabetes which in the past has been complicated by gastroparesis.  Since she  has been admitted to [facility], her blood sugar has been  consistently in the range of 280-400.  According to the patient, when she is  at home, she will administer 70/30 insulin to herself, taking 62 units in  the morning, 35 units at lunch, 35 units at dinner and also two additional  injections sometime during the day at unpredictable intervals. Sometimes she  will take another dose before she goes to bed at night.  She denies any  recent history of visual blurring, excessive thirst or urinary frequency.   Problem 2. Hypertension. The patient has a longstanding history of  hypertension. In general, her blood pressure is regulated at home with  medications as outlined below. The patient admits that she has run out of  her Catapres patch at home and thinks that she may have run out of her  lisinopril as well. She denies any recent history of  dyspnea, unusual chest  pain, orthopnea or PND.   CURRENT MEDICATIONS:  1.  Aspirin 81 mg daily.  2.  Klonopin 2 mg at bedtime daily.  3.  Lopressor 50 mg b.i.d.  4.  Lisinopril 40 mg once or possibly twice daily.  5.  Sertraline 100 mg daily.  6.  Zocor 20 mg daily.  7.  Reglan 10 mg a.c. meals and bedtime.  8.  Lasix 40 mg daily.  9.  Timolol eye drops.  10. Colace 100 mg b.i.d.  11. Avandia 4 mg b.i.d.  12. Lactulose 2 tablespoons daily.  13. 70/30 insulin as described above.  14. Catapres patch TTS-3 applied weekly.  15. Trazodone 50 mg at bedtime.  16. Benadryl 100 mg p.r.n.   OPERATIONS:  In 2004, she underwent cardiac catheterization and angioplasty  of a ramus intermedius branch.  At that time, she was found to have  significant left ventricular dysfunction with ejection fraction of  approximately 40%.  Other operations in the  past have included a  cholecystectomy, hysterectomy and another operation that she cannot recall  the details about.   PAST MEDICAL HISTORY:  1.  Coronary artery disease, status post angioplasty.  2.  Hypertension.  3.  Insulin-dependent diabetes.  4.  Obesity.  5.  Hyperlipidemia.  6.  Recurrent manic-depressive disorder.   FAMILY HISTORY:  Her mother died as a result of diabetes and heart disease.  Her sister has a history of MI. She has a brother who has high blood  pressure and diabetes. Another brother also has high blood pressure and  diabetes. She has a sister who is HIV positive and another brother who has  had previous angioplasty.   SOCIAL HISTORY:  The patient smokes about a pack of cigarettes per day. She  does not use alcohol or drugs.   REVIEW OF SYSTEMS:  Review of systems was somewhat difficult as the patient  is quite lethargic.  HEAD:  She denies headache.  EAR/NOSE/THROAT:  Denies earache, sinus pain or sore throat. CHEST:  Denies coughing, wheezing  or chest congestion. CARDIOVASCULAR:  Denies orthopnea, PND or ankle  edema.  GI:  Denies nausea, vomiting or abdominal pain. GU:  Denies dysuria or  urinary frequency. NEURO:  There is no history of seizure or stroke and  denies any excessive thirst or urinary frequency.   PHYSICAL EXAMINATION:  GENERAL:  She is an obese, lethargic lady.  HEENT:  Exam is within normal limits.  CHEST:  Clear.  CARDIOVASCULAR:  Normal S1-S2 without rubs, murmurs or gallops.  ABDOMEN:  Benign.  EXTREMITIES:  No cyanosis or edema.   LABORATORY DATA:  Relevant laboratory studies include normal liver  functions.  CBC revealed a white count of 6700, hemoglobin 12.3. TSH was  normal.  BMET was remarkable for a glucose of 281, creatinine 0.8, BUN is  12.  Since her admission, her blood pressure has been 175-180/107.   IMPRESSION:  1.  Uncontrolled diabetes, probably at least in part due to noncompliance.  2.  Diabetic gastroparesis.  3.  Hypertension.  4.  Coronary artery disease, status post angioplasty.  5.  Bipolar disorder.  6.  Gastroesophageal reflux.  7.  Status post hysterectomy and cholecystectomy.   PLAN:  I will rewrite her insulin orders.  This patient is obviously very  insulin-resistant and I am going to try to duplicate as much as possible her  insulin regimen as an outpatient.  Because it may be somewhat difficult to  combine 70/30 insulin with his sliding scale.  Instead, we will try  combining NPH insulin with sliding scale.  In regard to her blood pressure  medications, I am going to continue Lopressor 50 mg b.i.d., increase  lisinopril to 40  q.12h. and change her Catapres from the TTS-3 patch to oral Catapres on a  q.8h. regimen.  Perhaps this will be more effective.  We will continue her  other medications. Overall, I think the key thing will be to be patient over  the next few days. I think that her blood pressure and blood sugar will  normalize with resumption of her medications.     SY/MEDQ  D:  10/25/2004  T:  10/26/2004  Job:  [id number]   cc:    Melburn Hake, M.D.  [address] [place]  [place] [address]  Fax: [phone]

## 2010-10-11 NOTE — Discharge Summary (Signed)
Robin [name], Robin [name] NO.:  [id number]   MEDICAL RECORD NO.:  [id number]          PATIENT TYPE:  INP   LOCATION:  [id number]                         FACILITY:  [facility]   PHYSICIAN:  Melburn Hake, M.D.DATE OF BIRTH:  01/09/1951   DATE OF ADMISSION:  01/15/2004  DATE OF DISCHARGE:  01/23/2004                                 DISCHARGE SUMMARY   ADMITTING DIAGNOSES:  1.  Persistent nausea and vomiting.  2.  Gastroesophageal reflux.  3.  Type 2 diabetes.   DISCHARGE DIAGNOSES:  1.  Gastroesophageal reflux.  2.  Diabetic gastroparesis.   ADMITTING MANIFESTATIONS:  1.  Hypertension.  2.  Coronary artery disease.  3.  History of cardiac angioplasty.  4.  Hypokalemia.  5.  Bipolar disease.  6.  Anxiety.  7.  Frequent bipolar episodes.   BRIEF HISTORY AND PHYSICAL AND HOSPITAL COURSE:  This is a 60 year old  African-American female who was admitted on the night of admission with  persistent nausea and vomiting.  She had had a history of gastroesophageal  reflux on Protonix and Reglan.  The patient has diabetic gastroparesis that  has been fairly well controlled with Prevacid and Reglan.  She started  having vomiting could not keep the medications or anything down, and it  persisted.  The patient also has had several behavioral health admissions  for bipolar problems, severe depression, agitation, and anger.  When she  gets into this reflux stage and cannot keep her medications down, her blood  sugar become more uncontrolled;  however, it is frequently uncontrolled, and  blood pressure becomes more uncontrolled.  The patient's diabetes is very  labile and difficult to control, and she has mood changes.   PHYSICAL EXAMINATION:  VITAL SIGNS:  Blood pressure at this time is 192/94,  temperature 99.1, pulse 111, respirations 18, O2 saturations 99% on room  air.  HEENT:  Essentially negative.  CHEST:  Clear to auscultation and percussion.  She has sometimes a  regular  rhythm, but the patient actually has a sinus tachycardia.  ABDOMEN:  Soft, no masses, decreased bowel sounds;  however, the patient  complains of pain wherever she is touch in her abdomen.  We did a recent  outpatient ultrasound of her abdomen and pelvis, and it was all benign.  The  patient has a history of pancreatitis in the past; however, at this  admission, her amylase and lipase are both normal.  We are going to go ahead  and admit her and put her on IV Reglan and IV Protonix and hydrate the  patient and see if we cannot get her out of this nausea and vomiting cycle.   PERTINENT LABORATORY DATA:  Chemistries essentially normal;  however,  glucose is continuously elevated at 346, 232, 221, 207, 284.  Albumin ranged  between 3 and 4.  BUN is 13, creatinine 1.  White count 10.6, hematocrit 42,  hemoglobin 14.7.  Liver functions are normal.  Upper GI with small-bowel  follow-through had some presbyesophagus and probable gastroparesis and some  mild delay in gastric emptying, no evidence  of volvulus.   We did ask GI to see the patient.  Dr. Benson Norway saw the patient who works with  Dr. Collene Mares, and the patient was started on high-dose IV medications as stated  before, and GI concluded that she probably had gastroparesis.  The patient's  symptoms kind of persisted longer than one would have expected with  treatment.  She could not keep anything down for some time, and gradually  she was able to eat a liquid diet, then a soft diet, and her blood pressure  came down as we treated her and gave her some potassium as that is  frequently low.  She did get an endoscopy by Dr. Benson Norway, and the mucosa was  normal.  It was concluded that she most likely has some diabetic  gastroparesis.  Gradually, the patient seemed to improve, and she was  sitting up eating without nausea and vomiting.  She will be discharged after  she finishes eating.   DISCHARGE MEDICATIONS:  1.  Protonix 40 mg a day.  2.   Reglan 20 mg a.c. and h.s.  3.  Sublingual nitroglycerin 0.4.  4.  Donnatal tablets q.6 h.  5.  Catapres patch TTS-3 q.week.  6.  Sorbitol 2 tablespoons daily p.r.n. for constipation.  7.  Novolog sliding scale at home.  She has that.  8.  Desyrel 300 mg h.s.  9.  K-Dur 20 mEq b.i.d. p.o.  10. Lantus 15 units h.s.  11. Phenergan 25 mg p.r.n. for nausea.  12. Ambien 10 mg h.s. for sleep.   PLAN:  The patient was discharged to home, and GI was help was appreciated,  and she will see me in the office in two weeks and GI if needed.       CEF/MEDQ  D:  03/06/2004  T:  03/06/2004  Job:  [id number]

## 2010-10-11 NOTE — Consult Note (Signed)
Robin [name], Robin [name]                            ACCOUNT NO.:  [id number]   MEDICAL RECORD NO.:  [id number]                   PATIENT TYPE:  OBV   LOCATION:  3740                                 FACILITY:  [facility]   PHYSICIAN:  Tory Emerald. Benson Norway, MD                 DATE OF BIRTH:  09/25/1950   DATE OF CONSULTATION:  01/16/2004  DATE OF DISCHARGE:                                   CONSULTATION   GASTROENTEROLOGY CONSULT   REASON FOR CONSULTATION:  Nausea and vomiting.   HISTORY OF PRESENT ILLNESS:  This is a 60 year old black female with a past  medical history of hypertension, coronary artery disease status post stent  placement, uncontrolled diabetes, irritable bowel syndrome, status post  cholecystectomy in 1987 and a one-time history of pancreatitis, admitted for  acute nausea and vomiting.  The patient states that the vomiting started  acutely at 9:00 a.m. yesterday on the day of admission and has persisted  since that time despite medical treatment in the hospital.  The patient was  unable to tolerate any type of p.o. and was also accompanied by abdominal  pain.  She denies any evidence of hematemesis at the time of the onset of  the vomiting.  Additionally, the patient also denies any new medication.  The chart does give a history of a diabetic gastroparesis, but that is not  able to be confirmed at this time.  She does state that at home she has  blood sugars that are markedly elevated which ranges in the 270 to 300s  range.  She has seen an endocrinologist in the past, but currently does not  see any type of diabetic physician.  She denies any symptoms when her blood  pressure is elevated and at home her baseline blood pressures average  150s/90s.  The only symptoms that she does have with the elevation in blood  pressure is a headache.  The patient denies any fevers or chills, or any  reports of dysphagia.   PAST MEDICAL/SURGICAL HISTORY:  Stated above in the history of  present  illness.   FAMILY HISTORY:  Positive for diabetes and hypertension.   SOCIAL HISTORY:  Negative for alcohol, IV drug abuse and tobacco.  The  patient currently lives with a friend and is unemployed.   REVIEW OF SYSTEMS:  As stated above in the HPI with also addition of weight  loss secondary to diabetes.  No reports of any type of chest pain.  No  respiratory problems.  No skin problems.  NO SEASONAL ALLERGIES.   MEDICATIONS:  1. Clonidine 0.2 mg.  2. Regular insulin.  3. Lisinopril 40 mg.  4. Protonix 40 mg q.12 hours, IV.  5. Trazodone.  6. Labetalol.  7. Metoclopramide.  8. Diltiazem.   ALLERGIES:  NO KNOWN DRUG ALLERGIES.   PHYSICAL EXAMINATION:  VITAL SIGNS:  Blood pressure is 195/110, heart rate  is 118, temperature is 99.2, respirations 18.  GENERAL:  The patient is ill appearing; however, she is awake and  conversive.  NECK:  Supple, no lymphadenopathy.  LUNGS:  Clear to auscultation bilaterally.  CARDIOVASCULAR:  Regular rate and rhythm without murmurs, gallops or rubs.  ABDOMEN:  Obese, soft, tender in all quadrants, positive bowel sounds, no  rebound, no hepatosplenomegaly palpated.  There is no distention noted and  no evidence of tympany.  EXTREMITIES:  No clubbing, cyanosis, or edema.  SKIN:  The patient is warm to touch.  NEUROLOGIC:  No gross evidence of deficiency.   LABORATORY VALUES:  On January 15, 2004 -- white blood cell count 10.6,  hemoglobin 14.7, platelets 220,000, sodium 140, potassium 4.1, chloride 104,  CO2 25, BUN 13, creatinine 1.0, glucose 346, AST 25, ALT 27, total protein  is 75, albumin 4.1, amylase 55, lipase 29.   IMPRESSION:  Nausea and vomiting.  Differential diagnoses include diabetic  gastroparesis, mechanical obstruction and also esophagitis/peptic ulcer  disease.  Given the patient's history of uncontrolled diabetes, diabetic  gastroparesis is not unlikely; however, patients with gastroparesis in  approximately half the  cases are noted to have an idiopathic cause.   RECOMMENDATIONS AT THIS TIME:  1. Upper gastrointestinal series with small bowel follow-through to evaluate     for any mechanical obstruction or ileus.  2. Gastric-emptying scan.  The patient has a given history of diabetic     gastroparesis, however on review of the chart there is no evidence of a     gastric emptying scan.  This should be ordered in order to diagnose     gastroparesis.  3. Aggressive blood sugar control.  This may have benefit to control     gastroparesis and an endocrinology consultation should be considered.  4. Continue intravenous Reglan at this time.  5. The patient may require nasogastric tube with intermittent suction if     there is any evidence of obstruction or if she does not improve.  6. Experimental evidence does suggest that sublingual nitroglycerin may help     with diabetic gastroparesis in the acute setting.  This may be a viable     option for the patient if this is truly her cause.                                               Tory Emerald Benson Norway, MD    PDH/MEDQ  D:  01/16/2004  T:  01/17/2004  Job:  [id number]   cc:   Melburn Hake, M.D.  [address] [place]  [place] [address]  Fax: [phone]

## 2010-10-11 NOTE — H&P (Signed)
[place]  Patient:    Robin [name], Robin [name]                         MRN: [id number] Adm. Date:  [id number] Attending:  Chucky May Dhami                         History and Physical  HISTORY OF PRESENT ILLNESS:  The patient is a 60 year old divorced Afro-American female with history of bipolar/schizoaffective disorder of about nine years.  She was doing well until about a month ago when she began to decompensate, as per her partner.  She continues to feel depressed. Increasingly, she has been feeling hopeless, helpless and lately she began to have thoughts of death with a plan to kill herself.  She also had been having command hallucinations.  PAST PSYCHIATRIC HISTORY:  Patient was diagnosed with bipolar disorder about a nine years ago.  She has been hospitalized several times psychiatrically. History of one suicide attempt in the past.  SOCIAL HISTORY:  Patient lives with a female partner.  She is divorced.  She is disabled.  Denies any childhood abuse.  FAMILY HISTORY:  None.  ALCOHOL AND DRUG HISTORY:  None.  MEDICAL HISTORY:  She suffers from insulin-dependent diabetes and hypertension.  Patient sees Dr. Melburn Hake and is on the following medications:  MEDICATIONS  1. Humulin 70/30, 70 units q.a.m.  2. Risperdal 4 mg p.o. q.h.s.  3. Klonopin 1 mg three at bedtime.  4. Zestril 10 mg daily.  5. Estrogen 0.625 mg daily.  6. Prevacid 30 mg daily.  7. Allegra 180 mg a day.  8. Zoloft 50 mg daily.  9. Catapres 0.5 mg three b.i.d. 10. Seroquel 500 mg at bedtime 11. Trazodone 100 mg at bedtime. 12. Reglan 10 mg a.c. and h.s. 13. Cogentin 1 mg b.i.d. 14. Tiazac 180 mg a day.  PHYSICAL EXAMINATION:  Patient had a complete physical about a week ago by Dr. Mare Ferrari and hence not repeated.  MENTAL STATUS EXAMINATION:  Patient is fully alert.  Affect is flat.  There is no eye contact.  She is guarded.  She is coherent, logical,  goal-directed, depressed, hopeless, helpless, suicidal with a plan.  Admits to feeling paranoid and suspicious and had auditory hallucinations although they were just command in nature.  Patient was unable to do memory testing due to guardedness and paranoia and did not want to talk any more.  IMPRESSION Axes I:    1. Schizoaffective disorder, depressed, with psychotic features.            2. History of bipolar disorder. Axis II:   Deferred. Axes III:  1. Hypertension.            2. Insulin-dependent diabetes. Axis IV:   Chronic recurring illness. Axis V:    30/60.  INITIAL PLAN OF CARE:  I will admit her to [facility].  She will take part in individual and group psychotherapy.  Close watch/suicide precautions.  I will discontinue Prolixin and Reglan.  Family meeting will be done and she will take part in all the therapies and she will be given outpatient appointment upon discharge. DD:  02/20/00 TD:  02/21/00 Job: [id number]

## 2010-10-11 NOTE — Discharge Summary (Signed)
[place]  Patient:    LARENDA, FEAST Visit Number: [id number] MRN: [id number]          Service Type: Attending:  Melburn Hake, M.D. Dictated by:   Melburn Hake, M.D. Adm. Date:  03/03/01 Disc. Date: 03/10/01                             Discharge Summary  ADMITTING DIAGNOSIS:  Heavy feeling in her chest, some nausea and vomiting.  DISCHARGE DIAGNOSES: 1. Esophagitis. 2. Gastritis. 3. Abdominal pain. 4. History of pancreatitis. 5. Schizoaffective disease. 6. Type 2 diabetes mellitus. 7. Hypertension. 8. Constipation, etiology undetermined. 9. Depression, chronic.  BRIEF HISTORY, PHYSICAL AND HOSPITAL COURSE:  This is one of several admissions for this 60 year old African-American female who came to the office stating that she was very stressed, having the feeling of heaviness in her chest.  She frequently was nauseated.  She said she sometimes felt shortness of breath and had a steady pressure in her chest.  The patient has been admitted in the past for schizoaffective disorder.  She has type 2 diabetes. She has had a history of pancreatitis acute and chronic.  She is divorced and lives with a roommate.  She frequently goes to the [place] in [place] to get her medicines.  She has been admitted to [facility] and to [name] on both the medical and psychiatric services in the past.  Physical exam revealed a temperature of 97, pulse 103, respirations 18, blood pressure 156/92.  She is alert, oriented lying in bed.  HEENT is negative. Chest is clear to auscultation and percussion.  She has a sinus tachycardia. She complained of some pressure in her chest.  Abdomen sometimes aches.  She has active bowel sounds.  No mass.  No organomegaly.  She complains of generalized abdominal pain to palpation.  Pelvic was deferred.  She is status post hysterectomy.  She can move all of her extremities bilaterally.  She has had her  gallbladder removed.  We did a barium enema and upper GI series.  Had to do the barium enema several times because of retained feces.  Eventually it did not show anything but residual feces and once we found that the lower GI workup was negative, we transferred the patient to the psychiatry unit.  We decided that she should spend some time there because of her severe depression.  Dr. Rhona Raider, the psychiatrist saw the patient in consultation and thought she should spend some time in the psychiatric ward.  Her GI workup subsequently was essentially normal and she was subsequently transferred to the psychiatric unit.  We will see her after in follow up after she is released from the psychiatric unit. Dictated by:   Melburn Hake, M.D. Attending:  Melburn Hake, M.D. DD:  04/18/01 TD:  04/19/01 Job: [id number]

## 2010-10-11 NOTE — Discharge Summary (Signed)
NAMEJANIRA, Robin [name]                            ACCOUNT NO.:  [id number]   MEDICAL RECORD NO.:  [id number]                   PATIENT TYPE:  INP   LOCATION:  6525                                 FACILITY:  [facility]   PHYSICIAN:  Belva Crome, M.D.                DATE OF BIRTH:  11-30-1950   DATE OF ADMISSION:  10/06/2002  DATE OF DISCHARGE:  10/07/2002                                 DISCHARGE SUMMARY   ADMISSION DIAGNOSES:  1. Chest pain, rule out myocardial infarction.  2. Recent abnormal Cardiolite.  3. Hypertension.  4. Diabetes mellitus.  5. Tobacco use.  6. Bipolar disorder.   DISCHARGE DIAGNOSES:  1. Chest pain, resolved, myocardial infarction ruled out with negative     enzymes.  2. Recent abnormal Cardiolite.  3. Hypertension.  4. Diabetes mellitus.  5. Tobacco use.  6. Bipolar disorder.   INTERVENTIONS:  1. Cardiac catheterization revealing moderate CAD - did have a 60 to 70%     soft plaque/thrombus in the ramus which was intervened upon.  2. Financial embarrassment - Case Management consult for medication     assistance.  3. Tobacco abuse - counseling cessation.   HISTORY OF PRESENT ILLNESS:  Robin [name] is a 60 year old African-American  female patient who was recently evaluated at the office for chest pain.  She  had stress Cardiolite which suggested distal inferior fixed defect.  She was  scheduled for elective cardiac catheterization with Dr. Tamala Julian for next  Monday.  However, on the evening prior to admission she developed left  shoulder pain, arm pain and chest pain which was intermittent.  She had  associated nausea, shortness of breath and headache.  She came to [facility]  Emergency Room and was admitted by Dr. Jaci Standard.  EKG was nonacute and  initial cardiac enzymes were negative.  The patient was put on IV  nitroglycerine and Lovenox and scheduled for cardiac catheterization by Dr.  Tamala Julian.   PROCEDURE:  Cardiac catheterization Oct 06, 2002, by Dr.  Tamala Julian.   COMPLICATIONS:  None.   CONSULTATIONS:  Slip incision consult.   COURSE IN HOSPITAL:  Robin [name] was admitted to [facility] on Oct 06, 2002, for heartburn-type chest pain.  Cardiac enzymes were negative x3.  Hemoglobin slightly low at 10.7.  Rectal exam revealed brown stool and was  heme negative.   Cardiac catheterization was performed by Dr. Tamala Julian on Oct 06, 2002, and this  revealed left vein which was short and normal.  ERB had a 30% mid lesion.  The ramus had a 50 to 70% segmental proximal lesion.  IVUS of this area  showed a 60 to 70% lesion of soft plaque or thrombus.  The circumflex and RC  were both essentially normal.  IV gram revealed mid to distal inferior wall  severe hypo/akinesis with an EF of about 40%.  Dr. Tamala Julian proceeded  with  cutting bloom intervention to the ramus reducing the 70% lesion to about  40%.  TIMI-3 flow.  Of note, during vessel occlusion there was no chest pain  or arm pain.  He recommends continued aspirin and Plavix therapy for a  month.   The patient was seen by __________ consult team on Oct 06, 2002.   She will be seen by cardiac rehab and case management for assistance with  medications prior to discharge.   She has been seen an examined by Dr. Jaci Standard on Oct 07, 2002, who feels she  is clear for discharge to home.  Hemoglobin is stable at 10.5 and BUN is 10,  creatinine 0.9.   Total cholesterol is 119, triglyceride 127, HDL 47, and LDL 47.   DISCHARGE MEDICATIONS:  1. Enteric-coated aspirin 325 mg daily.  2. Plavix 75 mg a day for a month.  3. Insulin.  4. Clonidine 0.2 mg b.i.d.  5. Lisinopril 40 mg b.i.d.  6. Zocor 0.20 mg at bedtime.  7. Lasix as before.  8. Zoloft 200 mg a day.  9. Clonazepam 2 mg at bedtime.  10.      Quinepine 800 mg a day.  11.      Nitroglycerin as needed for chest pain.  12.      Claritin as before.  13.      Avandia 4 mg as before.   No strenuous activity, lifting more than 5 pounds, no  driving for 2 days.  May shower.   Low-fat, low-cholesterol, low-salt diet.   May shower.   She is to call the office if any problems or questions.   Follow up with Dr. Tamala Julian on Tuesday, June 1, at 12 p.m.     Dellia Nims Jernejcic, P.A.                   Belva Crome, M.D.    TCJ/MEDQ  D:  10/07/2002  T:  10/08/2002  Job:  [id number]
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
## 2010-10-11 NOTE — Op Note (Signed)
[name], Robin [name]                            ACCOUNT NO.:  [id number]   MEDICAL RECORD NO.:  [id number]                   PATIENT TYPE:  INP   LOCATION:  3740                                 FACILITY:  [facility]   PHYSICIAN:  Tory Emerald. Benson Norway, MD                 DATE OF BIRTH:  [birth year]   DATE OF PROCEDURE:  DATE OF DISCHARGE:                                 OPERATIVE REPORT   PROCEDURE NOTE:  EGD.   ENDOSCOPIST:  Tory Emerald. Benson Norway, MD.   INDICATIONS FOR PROCEDURE:  Nausea, vomiting, and abdominal pain.   PHYSICAL ASSESSMENT:  HEENT:  Normocephalic, atraumatic.  Extraocular  muscles intact.  Pupils equal, round, and reactive to light.  HEART:  Regular rate and rhythm.  CHEST/LUNGS:  Clear to auscultation bilaterally.  ABDOMEN:  Mild tenderness to palpation, positive bowel sounds.   HISTORY:  Briefly, this is a 60 year old black female with multiple medical  problems presenting for nausea, vomiting, and abdominal pain.  The patient  was noted to have high glucose levels ranging from in the 270 to 300 range,  and clinical impression was that the patient did have diabetic  gastroparesis; however, there is no confirmation with a gastric emptying  scan.  EGD was recommended to evaluate for any other causes such as  esophagitis or peptic ulcer disease.  Small bowel follow-through was normal  with no evidence of obstructive gastropathy.   DESCRIPTION OF PROCEDURE:  After adequate sedation, the patient was placed  in the left lateral decubitus position, and after adequate sedation was  obtained with Demerol 50 mg IV and Versed 15 mg IV, the EGD scope was  introduced and passed without difficulty into the proximal small bowel.  No  abnormalities were noted.  There was no evidence of esophagitis,  ulcerations, or erosions.  There was mild retention of liquids; however,  this was __________ significance.  There was no evidence of any hiatal  hernia.  No biopsies were obtained.  No  complications were encountered, and  the patient tolerated the procedure well.                                               Tory Emerald Benson Norway, MD    PDH/MEDQ  D:  01/19/2004  T:  01/19/2004  Job:  [id number]   cc:   [name]

## 2010-10-11 NOTE — Discharge Summary (Signed)
[place]  Patient:    Robin [name], Robin [name]                         MRN: [id number] Adm. Date:  [id number] Disc. Date: [id number] Attending:  Chucky May Dhami                           Discharge Summary  IDENTIFYING DATA:  Robin [name] is a 60 year old single African-American female with a history of bipolar disorder/schizoaffective disorder of nine years.  She was doing well until about a month ago when she began to decompensate.  She became acutely suicidal, had a plan to kill herself, had command hallucinations were telling to harm herself and hence this admission.  ADMITTING DIAGNOSES: Axis I:     Schizoaffective disorder/bipolar disorder, depressed, with             psychotic features, rule out schizophrenia. Axis II:    Deferred. Axis III:   Hypertension and insulin-dependent diabetes mellitus. Axis IV:    Chronic recurring illness, noncompliance. Axis V:     30/60.  HOSPITAL COURSE:  Patient was admitted.  Her home medications were resumed, which included Humulin 70/30 70 units q.a.m., Risperdal 4 mg p.o. q.h.s., Klonopin 3 mg at bedtime, Zesquler 10 mg at day, estrogen conjugated 0.625 daily, Prevacid 30 mg a day, Allegra 180 mg a day, Zoloft 50 mg a day, Catapres 0.5 three b.i.d., Seroquel 100 mg 5 at bedtime, Trazodone 100 mg at bedtime, Reglan HCL 10 mg 4 times a day, Cogentin 1 mg b.i.d. and Tyzek 180 mg a day.  Patient was admitted.  Routine blood work was done, which included urine drug screen, which was negative for illegal drugs, thyroid functions and basic metabolic panel were essentially within normal limits except glucose being 352, patient is diabetic.  Her urinalysis also revealed very few bacteria, RBCs 0-5, rest was negative.  Glucose was 1000.  Dr. Anson Fret was consulted.  He gave some orders on p.r.n. basis to manage her diabetes. During her stay in the hospital, Zoloft was increased to 100 mg a day, Reglan, Trazodone, Risperdal and  Prolixin were discontinued.  The dose of Seroquel was increased.  Patient began to improve.  She remained quite flat and did not participate until the latter part of the hospitalization.  On October 3, she advised me she was not suicidal.  Voices had resolved.  She had been improving and the voices had been resolving.  Hence, we planned discharge for October 3.  Discharged home on following meds:  DISCHARGE MEDICATIONS: 1. Seroquel 800 mg at bedtime. 2. Valium 5 mg, one in the morning and two at night. 3. Zoloft 100 mg a day. 4. Humulin 70/30 70 units every morning. 5. Zestril 10 mg a day. 6. Estrogen 0.625 mg daily. 7. Prevacid 30 mg a day. 8. Allegra 180 mg a day. 9. Catapres .5 three b.i.d. 10. Tyzek 180 mg once a day.  Patient was advised to call Dr. Dollene Primrose office and obtain an appointment.  FINAL DIAGNOSIS: Axis I:     Bipolar disorder, depressed, with psychotic features, rule out             schizophrenia and schizoaffective disorder. Axis II:    Deferred. Axis III:   Hypertension and insulin-dependent diabetes mellitus. Axis IV:    Chronic recurring illness, noncompliance. Axis V:     65.  PROGNOSIS:  Guarded. DD:  03/12/00 TD:  03/12/00 Job: [id number]
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
## 2010-10-11 NOTE — H&P (Signed)
[place]  Patient:    Robin [name], Robin [name] Visit Number: [id number] MRN: [id number]          Service Type: PSY Location: [place] Attending Physician:  Bess Harvest Dictated by:   Kerrie Buffalo Scott, N.P. Admit Date:  03/10/2001                     Psychiatric Admission Assessment  DATE OF ADMISSION:  March 10, 2001.  IDENTIFYING INFORMATION:  This is a 60 year old African-American female who is divorced.  She is a voluntary admission, being transferred from the Medicine Service at [place]:  The patient was admitted to [facility] via the emergency room on March 03, 2001, for chest pain and rule out a myocardial infarct after she had reported to her primary care physicians office.  While she was at her physicians office, he noted that she was also quite paranoid and somewhat agitated.  Apparently on workup at [facility] it was discovered that the patient had run out of her routine psychiatric medications about 2 weeks prior to her admission.  The patient now states that this was because her physician was changing doses on her and she was unable to get the prescriptions filled on a timely basis by the [facility] where she gets her prescriptions.  On the medicine unit, the patient did display some agitated behavior, some thoughts of paranoia, and some hostile thoughts of possibly harming her roommate.  With resumption of her medications, these behaviors decreased until the patient was calm and oriented and she was subsequently transferred. Today, the patient reports that indeed she had been off her medications approximately 2 weeks.  She also reports that she had had auditory hallucinations, that she had been hearing her mothers voice calling her, but she denies any command hallucinations, but that her mother would direct her in various activities during the  day.  Also, that she would be awakened at night by her mothers voice, hearing her mother call to her.  The patient also reports that she hears religious music at times during the day; however, she denies hearing any of this for the past 24 to 48 hours and has not heard her mothers voice in the past day or two.  The patient denies that she has any suicidal ideations at this time and no homicidal ideation.  She is calm and oriented today.  She is quite concerned, though, about making sure that she can get her medications when she goes home and stay on those.  PAST PSYCHIATRIC HISTORY:  The patient is followed by Dr. Marquis Lunch and is apparently compliant with her appointments there, according to her own report. She has a history of approximately 4 or 5 prior inpatient admissions.  This is her second admission to [facility].  She was seen on the 5000 unit several years ago, and she also has 3 to 4 prior admissions to the [name] for psychiatric problems.  She has been previously diagnosed with schizoaffective disorder.  She has a history of 3 prior suicide attempts.  SOCIAL HISTORY:  The patient lives in [place] with a friend and also with her daughter who also lives in the same house.  They assist her with activities of daily living, although the patient is able to drive and is able to obtain groceries and do errands on her own.  She is responsible for her  own finances.  She was in the [employer] from [place] to 1985.  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  She has not worked in several years and is currently disabled.  She receives a disability check and obtains all her medications from the [place]:  Positive for a maternal uncle with multiple psychiatric problems which are unclear.  ALCOHOL AND DRUG HISTORY:  The patient has no history of drugs or alcohol use. She does smoke cigarettes, although it  is unclear, somewhere between 1/2 and 1 pack per day.  Her cigarettes were discontinued and she was placed on a Nicoderm patch 14 mg while she was over on the medicine unit.  The patient does indicate interest in obtaining the patches but has concerns about being able to afford them, since she is not sure that the [facility] will provide those to her.  PAST MEDICAL HISTORY:  The patient is followed by Dr. Valda Favia here in [place], and he monitors her diabetes primarily.  Medical problems include diabetes mellitus type 2, with CBGs running less than or equal to about 250 mg 3 times a day when she checks it at home.  The patient also has a history of abdominal pain and constipation.  She has a past history of pancreatitis, and also past history of cholecystectomy.  Also history of exploratory lap for bladder tack-up and other abdominal problems, and she also has a history of hysterectomy.  She has had some history of chronic bronchitis in the past.  MEDICATIONS ON TRANSFER FROM THE UNIT:  1. Avandia 4 mg p.o. b.i.d.  2. Protonix 40 mg p.o. q.d.  3. Klonopin 3 mg p.o. q.h.s.  4. Valium 10 mg p.o. t.i.d.  5. Seroquel 800 mg p.o. q.h.s.  6. Trazodone 100 mg p.o. q.h.s.  7. Prinivil 10 mg p.o. q.d.  8. Zoloft 50 mg p.o. q.d.  9. Premarin 0.625 mg p.o. q.d. 10. Lasix 20 mg p.o. q.d. 11. Humulin 70/30 60 units q.a.m. and 20 units at lunch and 20 units     at supper.  We will contact Dr. Anson Fret to get the sliding scale since     the patient is not familiar with it. 12. Reglan 20 mg p.o. 20 minutes before each meal in the morning and in the     evening. 13. The patient also reports that she uses 2 tablespoons of lactulose every     morning with her coffee to control constipation.  DRUG ALLERGIES:  None.  POSITIVE PHYSICAL FINDINGS:  The patients physical examination was done by Medicine and that is contained in her record.  Her glucose here was 245 on admission around supper  time, and she had not at that point had her insulin dose.  Her hemoglobin A1C has been noted at 9.6.  Her troponin and cardiac  enzyme levels showed no acute cardiac event while she was on the Medicine Unit.  She did have a colonoscopy done on October 15 while she was there and that was essentially normal.  Her amylase and lactase were within normal limits..  She has not had a thyroid panel done.  On admission to the unit here, her vital signs were temp 97.4, pulse 112, respirations 12, blood pressure 147/87.  She is approximately 5 feet 4 inches tall and weighs 193 pounds.  She is obese but generally healthy in appearance and gait is normal, ambulates without assistance.  MENTAL STATUS  EXAMINATION:  This is a casually dressed, obese African-American female whose affect is appropriate.  She is fully alert and in no acute distress.  She is relaxed and cooperative, calm and oriented x 3.  She does note some intermittent irritability at times in discussing her history, but is generally cooperative.  Speech is normal and relevant.  Mood is fairly euthymic other than some mild intermittent irritability.  Thought process is logical and goal directed.  No evidence of psychosis at this time.  It appears that her auditory hallucinations appear to be resolving, since she states that she has not heard her mothers voice or heard any more religious music in the past 24 hours or so.  She shows no signs of delusions or paranoia at this time.  She has some concerns about her current roommate but these all seem to be well founded, rational, and reasonable.  Cognitively, she is intact and oriented x 4.  ADMISSION DIAGNOSES: Axis I:    Schizoaffective disorder, acute exacerbation. Axis II:   Deferred. Axis III:  Diabetes mellitus type 2, history of constipation, and tobacco            abuse, also history of pancreatitis current in remission. Axis IV:   Severe problems with financial constraints and being  in arrears            on her bank account right now, and with medical problems and the            need for getting regular medication from the [facility]. Axis V:   Current 14, past year 17.  INITIAL PLAN OF CARE:  Plan is to voluntarily admit the patient to evaluate her thought process and stabilize her mood.  We will do a thyroid panel on her and continue her current medications.  We will monitor her for any recurrence of agitation or auditory hallucinations or any paranoia.  Meanwhile, we will assist in arranging for her medications from the [place] and/or to provide her with some samples for [place] to avoid any issues of noncompliance as we plan for her discharge.  We will meanwhile contact her primary care Soraiya Ahner to get her insulin sliding scale that she is accustomed to since she is not able to tell us what that is.  Meanwhile, we are going to check her capillary blood glucose q.i.d.  We will also restart her on a 14 mg nicotine patch at this time, since she states she would like to stop smoking.  She just has concerns about how she can afford that when she leaves.  We will also be providing her with lactulose 2 tablespoons q.d. to prevent constipation.  We will ask the case manager to explore her family situation and the level of support there at home for her.  ESTIMATED LENGTH OF STAY:  Two to three days. Dictated by:   Kerrie Buffalo Scott, N.P. Attending Physician:  Bess Harvest DD:  03/11/01 TD:  03/14/01 Job: [id number]

## 2011-02-13 LAB — POCT CARDIAC MARKERS: Operator id: 294511

## 2011-02-13 LAB — BASIC METABOLIC PANEL
BUN: 14
BUN: 17
BUN: 18
BUN: 19
CO2: 25
CO2: 27
CO2: 29
Calcium: 8.5
Calcium: 8.5
Calcium: 8.7
Chloride: 101
Chloride: 103
Chloride: 105
Chloride: 105
Chloride: 99
Creatinine, Ser: 0.83
Creatinine, Ser: 0.89
Creatinine, Ser: 0.95
Creatinine, Ser: 0.96
GFR calc Af Amer: >60
GFR calc Af Amer: >60
GFR calc Af Amer: >60
GFR calc Af Amer: >60
GFR calc non Af Amer: >60
Glucose, Bld: 110 — ABNORMAL HIGH
Glucose, Bld: 237 — ABNORMAL HIGH
Potassium: 3.5
Potassium: 3.9
Potassium: 4.1
Potassium: 4.2
Sodium: 137
Sodium: 138
Sodium: 139

## 2011-02-13 LAB — I-STAT 8, (EC8 V) (CONVERTED LAB)
Bicarbonate: 26.4 — ABNORMAL HIGH
HCT: 34 — ABNORMAL LOW
Hemoglobin: 11.6 — ABNORMAL LOW
Operator id: 294511
Potassium: 3.7
Sodium: 139
TCO2: 28

## 2011-02-13 LAB — BLOOD GAS, ARTERIAL
FIO2: 1
O2 Saturation: 92.4
pH, Arterial: 7.365
pO2, Arterial: 79.3 — ABNORMAL LOW

## 2011-02-13 LAB — CBC
HCT: 30.1 — ABNORMAL LOW
HCT: 32.2 — ABNORMAL LOW
Hemoglobin: 10 — ABNORMAL LOW
Hemoglobin: 10 — ABNORMAL LOW
Hemoglobin: 11 — ABNORMAL LOW
MCHC: 33.8
MCHC: 33.8
MCHC: 34.1
MCHC: 34.4
MCV: 89.5
MCV: 90.3
MCV: 90.4
MCV: 90.6
MCV: 90.9
Platelets: 233
Platelets: 249
RBC: 2.96 — ABNORMAL LOW
RBC: 3.21 — ABNORMAL LOW
RBC: 3.28 — ABNORMAL LOW
RBC: 3.43 — ABNORMAL LOW
RBC: 3.6 — ABNORMAL LOW
RDW: 14.1
RDW: 14.2
RDW: 14.2
WBC: 14.1 — ABNORMAL HIGH
WBC: 9.4
WBC: 9.7

## 2011-02-13 LAB — POCT I-STAT 3, ART BLOOD GAS (G3+)
Bicarbonate: 29.5 — ABNORMAL HIGH
TCO2: 31
pCO2 arterial: 48.2 — ABNORMAL HIGH
pH, Arterial: 7.396

## 2011-02-13 LAB — LIPID PANEL
Cholesterol: 220 — ABNORMAL HIGH
HDL: 37 — ABNORMAL LOW
Triglycerides: 145

## 2011-02-13 LAB — B-NATRIURETIC PEPTIDE (CONVERTED LAB)
Pro B Natriuretic peptide (BNP): 120 — ABNORMAL HIGH
Pro B Natriuretic peptide (BNP): 248 — ABNORMAL HIGH

## 2011-02-13 LAB — CARDIAC PANEL(CRET KIN+CKTOT+MB+TROPI)
Relative Index: INVALID
Troponin I: 0.02

## 2011-02-13 LAB — POCT I-STAT CREATININE
Creatinine, Ser: 1.1
Operator id: 294511

## 2011-02-13 LAB — TROPONIN I
Troponin I: 0.04
Troponin I: 0.05

## 2011-02-13 LAB — CK TOTAL AND CKMB (NOT AT ARMC)
CK, MB: 1.8
Relative Index: INVALID
Total CK: 108
Total CK: 85

## 2011-02-13 LAB — PROTIME-INR: INR: 1

## 2011-02-13 LAB — DIFFERENTIAL
Eosinophils Absolute: 0.1
Lymphocytes Relative: 7 — ABNORMAL LOW
Lymphs Abs: 1
Monocytes Relative: 7
Neutrophils Relative %: 85 — ABNORMAL HIGH

## 2011-02-13 LAB — D-DIMER, QUANTITATIVE: D-Dimer, Quant: 0.92 — ABNORMAL HIGH

## 2011-02-13 LAB — HEPARIN LEVEL (UNFRACTIONATED)
Heparin Unfractionated: 0.2 — ABNORMAL LOW
Heparin Unfractionated: 0.66

## 2011-02-13 LAB — FERRITIN: Ferritin: 150 (ref 10–291)

## 2011-02-13 LAB — TSH: TSH: 1.014

## 2011-02-13 LAB — HEMOGLOBIN A1C: Mean Plasma Glucose: 297

## 2011-02-18 LAB — HEPATIC FUNCTION PANEL
ALT: 85 — ABNORMAL HIGH
AST: 69 — ABNORMAL HIGH
Albumin: 2.9 — ABNORMAL LOW
Alkaline Phosphatase: 123 — ABNORMAL HIGH
Bilirubin, Direct: 0.3
Indirect Bilirubin: 0.5
Total Bilirubin: 0.8
Total Protein: 6.1

## 2011-02-18 LAB — CBC
HCT: 27.5 — ABNORMAL LOW
HCT: 28.7 — ABNORMAL LOW
HCT: 28.9 — ABNORMAL LOW
HCT: 31.5 — ABNORMAL LOW
Hemoglobin: 10.1 — ABNORMAL LOW
Hemoglobin: 9.6 — ABNORMAL LOW
Hemoglobin: 9.6 — ABNORMAL LOW
Hemoglobin: 10.7 — ABNORMAL LOW
Hemoglobin: 11.7 — ABNORMAL LOW
MCHC: 34.8
MCHC: 33.8
MCHC: 34.5
MCV: 89.9
MCV: 90.9
MCV: 88.1
Platelets: 261
Platelets: 276
RBC: 3.1 — ABNORMAL LOW
RBC: 3.21 — ABNORMAL LOW
RBC: 3.58 — ABNORMAL LOW
RBC: 3.76 — ABNORMAL LOW
RDW: 14.9
RDW: 15.1
RDW: 15.4
WBC: 10.3
WBC: 9.4
WBC: 10.9 — ABNORMAL HIGH
WBC: 15.1 — ABNORMAL HIGH

## 2011-02-18 LAB — BASIC METABOLIC PANEL
BUN: 24 — ABNORMAL HIGH
CO2: 25
CO2: 24
Calcium: 8.4
Calcium: 8.7
Calcium: 8.8
GFR calc Af Amer: 52 — ABNORMAL LOW
GFR calc Af Amer: 55 — ABNORMAL LOW
GFR calc non Af Amer: 43 — ABNORMAL LOW
GFR calc non Af Amer: 45 — ABNORMAL LOW
GFR calc non Af Amer: 52 — ABNORMAL LOW
Glucose, Bld: 212 — ABNORMAL HIGH
Glucose, Bld: 224 — ABNORMAL HIGH
Potassium: 4.8
Potassium: 4.3
Sodium: 133 — ABNORMAL LOW
Sodium: 136

## 2011-02-18 LAB — IRON: Iron: 54

## 2011-02-18 LAB — COMPREHENSIVE METABOLIC PANEL
AST: 86 — ABNORMAL HIGH
AST: 40 — ABNORMAL HIGH
Albumin: 2.6 — ABNORMAL LOW
Albumin: 3 — ABNORMAL LOW
BUN: 20
BUN: 16
BUN: 19
CO2: 25
CO2: 25
Calcium: 8.6
Calcium: 8.6
Chloride: 100
Chloride: 105
Chloride: 110
Creatinine, Ser: 1.02
Creatinine, Ser: 1.31 — ABNORMAL HIGH
Creatinine, Ser: 1.08
Creatinine, Ser: 1.19
GFR calc Af Amer: >60
GFR calc non Af Amer: 42 — ABNORMAL LOW
GFR calc non Af Amer: 47 — ABNORMAL LOW
Glucose, Bld: 160 — ABNORMAL HIGH
Glucose, Bld: 222 — ABNORMAL HIGH
Glucose, Bld: 149 — ABNORMAL HIGH
Total Bilirubin: 0.3
Total Bilirubin: 0.4
Total Bilirubin: 0.8
Total Protein: 5.7 — ABNORMAL LOW
Total Protein: 6.2

## 2011-02-18 LAB — DIFFERENTIAL
Basophils Absolute: 0
Basophils Absolute: 0.3 — ABNORMAL HIGH
Basophils Relative: 2 — ABNORMAL HIGH
Eosinophils Absolute: 0.1
Eosinophils Relative: 1
Lymphocytes Relative: 26
Lymphocytes Relative: 10 — ABNORMAL LOW
Lymphs Abs: 1.5
Monocytes Absolute: 1.1 — ABNORMAL HIGH
Monocytes Absolute: 1
Monocytes Relative: 7
Neutro Abs: 6
Neutro Abs: 12.2 — ABNORMAL HIGH
Neutrophils Relative %: 61
Neutrophils Relative %: 81 — ABNORMAL HIGH

## 2011-02-18 LAB — URINE MICROSCOPIC-ADD ON

## 2011-02-18 LAB — DIGOXIN LEVEL: Digoxin Level: 0.6 — ABNORMAL LOW

## 2011-02-18 LAB — D-DIMER, QUANTITATIVE: D-Dimer, Quant: 1.43 — ABNORMAL HIGH

## 2011-02-18 LAB — URINALYSIS, ROUTINE W REFLEX MICROSCOPIC
Bilirubin Urine: NEGATIVE
Nitrite: NEGATIVE
Specific Gravity, Urine: 1.015
Urobilinogen, UA: 1

## 2011-02-18 LAB — LIPID PANEL
Cholesterol: 106
Total CHOL/HDL Ratio: 3.5
VLDL: 28

## 2011-02-18 LAB — APTT: aPTT: 34

## 2011-02-18 LAB — IRON AND TIBC
Iron: 52
TIBC: 267

## 2011-02-18 LAB — PROTIME-INR
INR: 1.1
Prothrombin Time: 14

## 2011-02-18 LAB — MAGNESIUM: Magnesium: 1.9

## 2011-02-18 LAB — CARDIAC PANEL(CRET KIN+CKTOT+MB+TROPI)
CK, MB: 1.5
Total CK: 261 — ABNORMAL HIGH
Total CK: 266 — ABNORMAL HIGH
Troponin I: 0.05

## 2011-02-18 LAB — CK TOTAL AND CKMB (NOT AT ARMC)
CK, MB: 2
Relative Index: 0.6
Total CK: 315 — ABNORMAL HIGH

## 2011-02-18 LAB — TROPONIN I: Troponin I: 0.05

## 2011-02-18 LAB — RETICULOCYTES: Retic Count, Absolute: 63.8

## 2011-02-18 LAB — FERRITIN: Ferritin: 139 (ref 10–291)

## 2011-02-18 LAB — POCT CARDIAC MARKERS: CKMB, poc: 1.9

## 2011-02-18 LAB — TSH: TSH: 1.624

## 2011-02-18 LAB — B-NATRIURETIC PEPTIDE (CONVERTED LAB)
Pro B Natriuretic peptide (BNP): 147 — ABNORMAL HIGH
Pro B Natriuretic peptide (BNP): 197 — ABNORMAL HIGH

## 2011-02-24 LAB — COMPREHENSIVE METABOLIC PANEL
ALT: 42 — ABNORMAL HIGH
AST: 24
AST: 45 — ABNORMAL HIGH
Albumin: 2.5 — ABNORMAL LOW
Albumin: 2.6 — ABNORMAL LOW
Alkaline Phosphatase: 149 — ABNORMAL HIGH
Alkaline Phosphatase: 173 — ABNORMAL HIGH
BUN: 12
BUN: 16
BUN: 9
CO2: 24
CO2: 26
Calcium: 8 — ABNORMAL LOW
Calcium: 8.3 — ABNORMAL LOW
Chloride: 105
Chloride: 108
Creatinine, Ser: 1.11
Creatinine, Ser: 1.16
Creatinine, Ser: 1.5 — ABNORMAL HIGH
GFR calc Af Amer: 44 — ABNORMAL LOW
GFR calc Af Amer: 58 — ABNORMAL LOW
GFR calc non Af Amer: 49 — ABNORMAL LOW
GFR calc non Af Amer: 51 — ABNORMAL LOW
Glucose, Bld: 59 — ABNORMAL LOW
Potassium: 3.2 — ABNORMAL LOW
Potassium: 3.7
Sodium: 135
Total Bilirubin: 0.5
Total Bilirubin: 0.6
Total Protein: 6.4

## 2011-02-24 LAB — CULTURE, ROUTINE-ABSCESS

## 2011-02-24 LAB — POCT I-STAT, CHEM 8
BUN: 12
Calcium, Ion: 1.14
Chloride: 108
Creatinine, Ser: 1.2
Glucose, Bld: 110 — ABNORMAL HIGH

## 2011-02-24 LAB — CBC
HCT: 27.4 — ABNORMAL LOW
Hemoglobin: 10.1 — ABNORMAL LOW
Hemoglobin: 9.2 — ABNORMAL LOW
Hemoglobin: 9.3 — ABNORMAL LOW
MCHC: 33.3
MCHC: 34.2
MCHC: 34.6
MCV: 87.4
MCV: 87.5
Platelets: 262
Platelets: 292
RBC: 3.08 — ABNORMAL LOW
RBC: 3.14 — ABNORMAL LOW
RBC: 3.33 — ABNORMAL LOW
RDW: 15.3
RDW: 15.4
WBC: 15.1 — ABNORMAL HIGH
WBC: 9.1

## 2011-02-24 LAB — CULTURE, BLOOD (ROUTINE X 2): Culture: NO GROWTH

## 2011-02-24 LAB — T4: T4, Total: 7.4

## 2011-02-24 LAB — URINALYSIS, ROUTINE W REFLEX MICROSCOPIC
Bilirubin Urine: NEGATIVE
Glucose, UA: NEGATIVE
Hgb urine dipstick: NEGATIVE
Protein, ur: >300 — AB
Urobilinogen, UA: 0.2

## 2011-02-24 LAB — BLOOD GAS, ARTERIAL
Acid-base deficit: 0.6
O2 Content: 2
O2 Saturation: 91.3
Patient temperature: 98.6
TCO2: 24.7
pCO2 arterial: 38.6

## 2011-02-24 LAB — DIFFERENTIAL
Basophils Absolute: 0.1
Basophils Relative: 0
Basophils Relative: 1
Eosinophils Absolute: 0.2
Eosinophils Relative: 2
Eosinophils Relative: 3
Eosinophils Relative: 3
Lymphocytes Relative: 11 — ABNORMAL LOW
Lymphocytes Relative: 15
Lymphs Abs: 1.5
Lymphs Abs: 1.7
Monocytes Absolute: 0.9
Monocytes Absolute: 0.9
Monocytes Absolute: 1
Monocytes Absolute: 1.4 — ABNORMAL HIGH
Monocytes Relative: 9
Monocytes Relative: 9
Neutro Abs: 11.9 — ABNORMAL HIGH
Neutro Abs: 6
Neutro Abs: 8.3 — ABNORMAL HIGH
Neutrophils Relative %: 73

## 2011-02-24 LAB — APTT: aPTT: 39 — ABNORMAL HIGH

## 2011-02-24 LAB — GLUCOSE, CAPILLARY
Glucose-Capillary: 119 — ABNORMAL HIGH
Glucose-Capillary: 126 — ABNORMAL HIGH
Glucose-Capillary: 132 — ABNORMAL HIGH
Glucose-Capillary: 140 — ABNORMAL HIGH
Glucose-Capillary: 142 — ABNORMAL HIGH
Glucose-Capillary: 161 — ABNORMAL HIGH
Glucose-Capillary: 165 — ABNORMAL HIGH
Glucose-Capillary: 167 — ABNORMAL HIGH
Glucose-Capillary: 172 — ABNORMAL HIGH
Glucose-Capillary: 190 — ABNORMAL HIGH
Glucose-Capillary: 249 — ABNORMAL HIGH
Glucose-Capillary: 78

## 2011-02-24 LAB — POCT CARDIAC MARKERS: Troponin i, poc: <0.05

## 2011-02-24 LAB — TSH: TSH: 1.847

## 2011-02-24 LAB — URINE MICROSCOPIC-ADD ON

## 2011-02-24 LAB — LIPASE, BLOOD: Lipase: 15

## 2014-09-24 HISTORY — PX: AV FISTULA PLACEMENT: SHX1204

## 2014-11-17 ENCOUNTER — Encounter (HOSPITAL_COMMUNITY): Payer: Self-pay

## 2014-11-17 ENCOUNTER — Inpatient Hospital Stay (HOSPITAL_COMMUNITY): Payer: Medicare Other

## 2014-11-17 ENCOUNTER — Inpatient Hospital Stay (HOSPITAL_COMMUNITY)
Admission: AD | Admit: 2014-11-17 | Discharge: 2014-11-22 | DRG: 492 | Disposition: A | Discharge status: Skilled Nursing Facility | Payer: Medicare Other | Source: Ambulatory Visit | Attending: Internal Medicine | Admitting: Internal Medicine

## 2014-11-17 ENCOUNTER — Ambulatory Visit (HOSPITAL_COMMUNITY): Payer: Medicare Other | Admitting: Anesthesiology

## 2014-11-17 ENCOUNTER — Encounter (HOSPITAL_COMMUNITY)
Admission: AD | Disposition: A | Discharge status: Skilled Nursing Facility | Payer: Self-pay | Source: Ambulatory Visit | Attending: Internal Medicine

## 2014-11-17 DIAGNOSIS — Z955 Presence of coronary angioplasty implant and graft: Secondary | ICD-10-CM | POA: Diagnosis not present

## 2014-11-17 DIAGNOSIS — S82892G Other fracture of left lower leg, subsequent encounter for closed fracture with delayed healing: Secondary | ICD-10-CM | POA: Diagnosis not present

## 2014-11-17 DIAGNOSIS — F17210 Nicotine dependence, cigarettes, uncomplicated: Secondary | ICD-10-CM | POA: Diagnosis present

## 2014-11-17 DIAGNOSIS — G9340 Encephalopathy, unspecified: Secondary | ICD-10-CM | POA: Diagnosis not present

## 2014-11-17 DIAGNOSIS — S82899A Other fracture of unspecified lower leg, initial encounter for closed fracture: Secondary | ICD-10-CM | POA: Diagnosis present

## 2014-11-17 DIAGNOSIS — W06XXXA Fall from bed, initial encounter: Secondary | ICD-10-CM | POA: Diagnosis present

## 2014-11-17 DIAGNOSIS — I429 Cardiomyopathy, unspecified: Secondary | ICD-10-CM | POA: Diagnosis not present

## 2014-11-17 DIAGNOSIS — Z6831 Body mass index (BMI) 31.0-31.9, adult: Secondary | ICD-10-CM | POA: Diagnosis not present

## 2014-11-17 DIAGNOSIS — I5042 Chronic combined systolic (congestive) and diastolic (congestive) heart failure: Secondary | ICD-10-CM

## 2014-11-17 DIAGNOSIS — D649 Anemia, unspecified: Secondary | ICD-10-CM | POA: Diagnosis present

## 2014-11-17 DIAGNOSIS — E876 Hypokalemia: Secondary | ICD-10-CM

## 2014-11-17 DIAGNOSIS — E1165 Type 2 diabetes mellitus with hyperglycemia: Secondary | ICD-10-CM | POA: Diagnosis not present

## 2014-11-17 DIAGNOSIS — S82892D Other fracture of left lower leg, subsequent encounter for closed fracture with routine healing: Secondary | ICD-10-CM | POA: Diagnosis not present

## 2014-11-17 DIAGNOSIS — G4730 Sleep apnea, unspecified: Secondary | ICD-10-CM | POA: Diagnosis present

## 2014-11-17 DIAGNOSIS — E669 Obesity, unspecified: Secondary | ICD-10-CM | POA: Diagnosis present

## 2014-11-17 DIAGNOSIS — I2510 Atherosclerotic heart disease of native coronary artery without angina pectoris: Secondary | ICD-10-CM

## 2014-11-17 DIAGNOSIS — R410 Disorientation, unspecified: Secondary | ICD-10-CM

## 2014-11-17 DIAGNOSIS — F319 Bipolar disorder, unspecified: Secondary | ICD-10-CM | POA: Diagnosis present

## 2014-11-17 DIAGNOSIS — I10 Essential (primary) hypertension: Secondary | ICD-10-CM | POA: Diagnosis not present

## 2014-11-17 DIAGNOSIS — O1002 Pre-existing essential hypertension complicating childbirth: Secondary | ICD-10-CM | POA: Diagnosis present

## 2014-11-17 DIAGNOSIS — I152 Hypertension secondary to endocrine disorders: Secondary | ICD-10-CM

## 2014-11-17 DIAGNOSIS — E1122 Type 2 diabetes mellitus with diabetic chronic kidney disease: Secondary | ICD-10-CM | POA: Diagnosis present

## 2014-11-17 DIAGNOSIS — IMO0002 Reserved for concepts with insufficient information to code with codable children: Secondary | ICD-10-CM

## 2014-11-17 DIAGNOSIS — S82852A Displaced trimalleolar fracture of left lower leg, initial encounter for closed fracture: Principal | ICD-10-CM | POA: Diagnosis present

## 2014-11-17 DIAGNOSIS — Z794 Long term (current) use of insulin: Secondary | ICD-10-CM

## 2014-11-17 DIAGNOSIS — N184 Chronic kidney disease, stage 4 (severe): Secondary | ICD-10-CM | POA: Diagnosis present

## 2014-11-17 DIAGNOSIS — I129 Hypertensive chronic kidney disease with stage 1 through stage 4 chronic kidney disease, or unspecified chronic kidney disease: Secondary | ICD-10-CM | POA: Diagnosis present

## 2014-11-17 DIAGNOSIS — T148XXA Other injury of unspecified body region, initial encounter: Secondary | ICD-10-CM

## 2014-11-17 DIAGNOSIS — S82899D Other fracture of unspecified lower leg, subsequent encounter for closed fracture with routine healing: Secondary | ICD-10-CM | POA: Diagnosis not present

## 2014-11-17 DIAGNOSIS — E1129 Type 2 diabetes mellitus with other diabetic kidney complication: Secondary | ICD-10-CM | POA: Diagnosis not present

## 2014-11-17 DIAGNOSIS — S82892A Other fracture of left lower leg, initial encounter for closed fracture: Secondary | ICD-10-CM | POA: Diagnosis present

## 2014-11-17 DIAGNOSIS — E119 Type 2 diabetes mellitus without complications: Secondary | ICD-10-CM | POA: Diagnosis present

## 2014-11-17 HISTORY — DX: Hypokalemia: E87.6

## 2014-11-17 HISTORY — PX: ANKLE CLOSED REDUCTION: SHX880

## 2014-11-17 HISTORY — DX: Obesity, unspecified: E66.9

## 2014-11-17 HISTORY — DX: Essential (primary) hypertension: I10

## 2014-11-17 HISTORY — DX: Atherosclerotic heart disease of native coronary artery without angina pectoris: I25.10

## 2014-11-17 HISTORY — DX: Other fracture of unspecified lower leg, initial encounter for closed fracture: S82.899A

## 2014-11-17 HISTORY — DX: Personal history of other medical treatment: Z92.89

## 2014-11-17 HISTORY — DX: Other fracture of left lower leg, initial encounter for closed fracture: S82.892A

## 2014-11-17 LAB — CBC
HCT: 29.7 % — ABNORMAL LOW (ref 36.0–46.0)
HEMOGLOBIN: 9.4 g/dL — AB (ref 12.0–15.0)
MCH: 29.7 pg (ref 26.0–34.0)
MCHC: 31.6 g/dL (ref 30.0–36.0)
MCV: 93.7 fL (ref 78.0–100.0)
PLATELETS: 213 10*3/uL (ref 150–400)
RBC: 3.17 MIL/uL — ABNORMAL LOW (ref 3.87–5.11)
RDW: 16.4 % — AB (ref 11.5–15.5)
WBC: 7.5 10*3/uL (ref 4.0–10.5)

## 2014-11-17 LAB — BRAIN NATRIURETIC PEPTIDE: B Natriuretic Peptide: 492.8 pg/mL — ABNORMAL HIGH (ref 0.0–100.0)

## 2014-11-17 LAB — POCT I-STAT 4, (NA,K, GLUC, HGB,HCT)
Glucose, Bld: 367 mg/dL — ABNORMAL HIGH (ref 65–99)
HCT: 31 % — ABNORMAL LOW (ref 36.0–46.0)
Hemoglobin: 10.5 g/dL — ABNORMAL LOW (ref 12.0–15.0)
Potassium: 3.3 mmol/L — ABNORMAL LOW (ref 3.5–5.1)
SODIUM: 136 mmol/L (ref 135–145)

## 2014-11-17 LAB — MAGNESIUM: Magnesium: 2 mg/dL (ref 1.7–2.4)

## 2014-11-17 LAB — BASIC METABOLIC PANEL
Anion gap: 10 (ref 5–15)
BUN: 44 mg/dL — ABNORMAL HIGH (ref 6–20)
CALCIUM: 8.6 mg/dL — AB (ref 8.9–10.3)
CO2: 21 mmol/L — AB (ref 22–32)
CREATININE: 3.78 mg/dL — AB (ref 0.44–1.00)
Chloride: 105 mmol/L (ref 101–111)
GFR calc Af Amer: 14 mL/min — ABNORMAL LOW (ref >60)
GFR calc non Af Amer: 12 mL/min — ABNORMAL LOW (ref >60)
Glucose, Bld: 328 mg/dL — ABNORMAL HIGH (ref 65–99)
Potassium: 3.7 mmol/L (ref 3.5–5.1)
Sodium: 136 mmol/L (ref 135–145)

## 2014-11-17 LAB — GLUCOSE, CAPILLARY
Glucose-Capillary: 353 mg/dL — ABNORMAL HIGH (ref 65–99)
Glucose-Capillary: 311 mg/dL — ABNORMAL HIGH (ref 65–99)
Glucose-Capillary: 311 mg/dL — ABNORMAL HIGH (ref 65–99)

## 2014-11-17 LAB — SURGICAL PCR SCREEN
MRSA, PCR: NEGATIVE
Staphylococcus aureus: NEGATIVE

## 2014-11-17 LAB — PHOSPHORUS: Phosphorus: 3.9 mg/dL (ref 2.5–4.6)

## 2014-11-17 SURGERY — CLOSED REDUCTION, ANKLE
Anesthesia: General | Site: Ankle

## 2014-11-17 MED ORDER — BISACODYL 5 MG PO TBEC: 5.0000 mg | DELAYED_RELEASE_TABLET | Freq: Every day | ORAL | Status: DC | PRN

## 2014-11-17 MED ORDER — DOCUSATE SODIUM 100 MG PO CAPS: 100.0000 mg | ORAL_CAPSULE | Freq: Two times a day (BID) | ORAL | Status: DC

## 2014-11-17 MED ORDER — INSULIN ASPART 100 UNIT/ML ~~LOC~~ SOLN
0.0000 [IU] | Freq: Three times a day (TID) | SUBCUTANEOUS | Status: DC
  Administered 2014-11-18: 3 [IU] via SUBCUTANEOUS
  Administered 2014-11-18: 2 [IU] via SUBCUTANEOUS
  Administered 2014-11-18: 3 [IU] via SUBCUTANEOUS
  Administered 2014-11-19 – 2014-11-20 (×3): 5 [IU] via SUBCUTANEOUS
  Administered 2014-11-20 – 2014-11-21 (×2): 3 [IU] via SUBCUTANEOUS
  Administered 2014-11-21: 2 [IU] via SUBCUTANEOUS
  Administered 2014-11-22: 3 [IU] via SUBCUTANEOUS

## 2014-11-17 MED ORDER — ACETAMINOPHEN 325 MG PO TABS
650.0000 mg | ORAL_TABLET | Freq: Four times a day (QID) | ORAL | Status: DC | PRN
  Administered 2014-11-22: 650 mg via ORAL
  Filled 2014-11-17: qty 2

## 2014-11-17 MED ORDER — ISOSORBIDE MONONITRATE ER 60 MG PO TB24
60.0000 mg | ORAL_TABLET | Freq: Every day | ORAL | Status: DC
  Administered 2014-11-17 – 2014-11-22 (×5): 60 mg via ORAL
  Filled 2014-11-17 (×5): qty 1

## 2014-11-17 MED ORDER — ONDANSETRON HCL 4 MG PO TABS: 4.0000 mg | ORAL_TABLET | Freq: Four times a day (QID) | ORAL | Status: DC | PRN

## 2014-11-17 MED ORDER — BENZTROPINE MESYLATE 0.5 MG PO TABS
0.5000 mg | ORAL_TABLET | Freq: Two times a day (BID) | ORAL | Status: DC
Start: 2014-11-17 — End: 2014-11-22
  Administered 2014-11-17 – 2014-11-22 (×9): 0.5 mg via ORAL
  Filled 2014-11-17 (×14): qty 1

## 2014-11-17 MED ORDER — FENTANYL CITRATE (PF) 100 MCG/2ML IJ SOLN
INTRAMUSCULAR | Status: DC | PRN
  Administered 2014-11-17 (×5): 50 ug via INTRAVENOUS

## 2014-11-17 MED ORDER — ALBUTEROL SULFATE (2.5 MG/3ML) 0.083% IN NEBU: 3.0000 mL | INHALATION_SOLUTION | Freq: Four times a day (QID) | RESPIRATORY_TRACT | Status: DC | PRN

## 2014-11-17 MED ORDER — DOCUSATE SODIUM 100 MG PO CAPS
100.0000 mg | ORAL_CAPSULE | Freq: Two times a day (BID) | ORAL | Status: DC
  Administered 2014-11-17 – 2014-11-22 (×8): 100 mg via ORAL
  Filled 2014-11-17 (×8): qty 1

## 2014-11-17 MED ORDER — AMLODIPINE BESYLATE 10 MG PO TABS
10.0000 mg | ORAL_TABLET | Freq: Every day | ORAL | Status: DC
  Administered 2014-11-18 – 2014-11-22 (×3): 10 mg via ORAL
  Filled 2014-11-17 (×4): qty 1

## 2014-11-17 MED ORDER — HEPARIN SODIUM (PORCINE) 5000 UNIT/ML IJ SOLN
5000.0000 [IU] | Freq: Three times a day (TID) | INTRAMUSCULAR | Status: DC
  Administered 2014-11-17 – 2014-11-22 (×13): 5000 [IU] via SUBCUTANEOUS
  Filled 2014-11-17 (×13): qty 1

## 2014-11-17 MED ORDER — MUPIROCIN 2 % EX OINT
TOPICAL_OINTMENT | CUTANEOUS | Status: AC
  Filled 2014-11-17: qty 22

## 2014-11-17 MED ORDER — PROPOFOL 10 MG/ML IV BOLUS
INTRAVENOUS | Status: DC | PRN
Start: 2014-11-17 — End: 2014-11-17
  Administered 2014-11-17: 150 mg via INTRAVENOUS

## 2014-11-17 MED ORDER — MIDAZOLAM HCL 2 MG/2ML IJ SOLN
INTRAMUSCULAR | Status: AC
  Filled 2014-11-17: qty 2

## 2014-11-17 MED ORDER — HYDROCODONE-ACETAMINOPHEN 5-325 MG PO TABS
1.0000 | ORAL_TABLET | ORAL | Status: DC | PRN
  Administered 2014-11-18: 2 via ORAL
  Administered 2014-11-19: 1 via ORAL
  Filled 2014-11-17: qty 1
  Filled 2014-11-17: qty 2

## 2014-11-17 MED ORDER — QUETIAPINE FUMARATE 300 MG PO TABS
600.0000 mg | ORAL_TABLET | Freq: Every day | ORAL | Status: DC
  Administered 2014-11-17 – 2014-11-20 (×4): 600 mg via ORAL
  Filled 2014-11-17 (×5): qty 2

## 2014-11-17 MED ORDER — ACETAMINOPHEN 650 MG RE SUPP: 650.0000 mg | Freq: Four times a day (QID) | RECTAL | Status: DC | PRN

## 2014-11-17 MED ORDER — MULTI-VITAMIN/MINERALS PO TABS
1.0000 | ORAL_TABLET | Freq: Every day | ORAL | Status: DC
  Administered 2014-11-18 – 2014-11-22 (×4): 1 via ORAL
  Filled 2014-11-17 (×6): qty 1

## 2014-11-17 MED ORDER — PROMETHAZINE HCL 25 MG/ML IJ SOLN: 6.2500 mg | INTRAMUSCULAR | Status: DC | PRN

## 2014-11-17 MED ORDER — HYDROMORPHONE HCL 1 MG/ML IJ SOLN
0.5000 mg | INTRAMUSCULAR | Status: DC | PRN
  Administered 2014-11-20: 0.5 mg via INTRAVENOUS
  Filled 2014-11-17: qty 1

## 2014-11-17 MED ORDER — SENNOSIDES-DOCUSATE SODIUM 8.6-50 MG PO TABS: 1.0000 | ORAL_TABLET | Freq: Every evening | ORAL | Status: DC | PRN

## 2014-11-17 MED ORDER — FLEET ENEMA 7-19 GM/118ML RE ENEM: 1.0000 | ENEMA | Freq: Once | RECTAL | Status: AC | PRN

## 2014-11-17 MED ORDER — CARVEDILOL 25 MG PO TABS
25.0000 mg | ORAL_TABLET | Freq: Two times a day (BID) | ORAL | Status: DC
  Administered 2014-11-17 – 2014-11-22 (×10): 25 mg via ORAL
  Filled 2014-11-17 (×10): qty 1

## 2014-11-17 MED ORDER — ONDANSETRON HCL 4 MG/2ML IJ SOLN
INTRAMUSCULAR | Status: DC | PRN
  Administered 2014-11-17: 4 mg via INTRAVENOUS

## 2014-11-17 MED ORDER — HYDRALAZINE HCL 100 MG PO TABS
100.0000 mg | ORAL_TABLET | Freq: Two times a day (BID) | ORAL | Status: DC
  Administered 2014-11-17 – 2014-11-18 (×2): 100 mg via ORAL
  Filled 2014-11-17 (×3): qty 1

## 2014-11-17 MED ORDER — METOCLOPRAMIDE HCL 5 MG PO TABS: 5.0000 mg | ORAL_TABLET | Freq: Three times a day (TID) | ORAL | Status: DC | PRN

## 2014-11-17 MED ORDER — SENNA 8.6 MG PO TABS
1.0000 | ORAL_TABLET | Freq: Two times a day (BID) | ORAL | Status: DC
  Administered 2014-11-17 – 2014-11-22 (×8): 8.6 mg via ORAL
  Filled 2014-11-17 (×9): qty 1

## 2014-11-17 MED ORDER — LIDOCAINE HCL (CARDIAC) 20 MG/ML IV SOLN
INTRAVENOUS | Status: DC | PRN
  Administered 2014-11-17: 60 mg via INTRAVENOUS

## 2014-11-17 MED ORDER — FENTANYL CITRATE (PF) 100 MCG/2ML IJ SOLN
INTRAMUSCULAR | Status: AC
  Filled 2014-11-17: qty 2

## 2014-11-17 MED ORDER — ALUM & MAG HYDROXIDE-SIMETH 200-200-20 MG/5ML PO SUSP: 30.0000 mL | Freq: Four times a day (QID) | ORAL | Status: DC | PRN

## 2014-11-17 MED ORDER — INSULIN ASPART 100 UNIT/ML ~~LOC~~ SOLN
0.0000 [IU] | Freq: Every day | SUBCUTANEOUS | Status: DC
  Administered 2014-11-17: 4 [IU] via SUBCUTANEOUS
  Administered 2014-11-19: 3 [IU] via SUBCUTANEOUS

## 2014-11-17 MED ORDER — BUMETANIDE 2 MG PO TABS
2.0000 mg | ORAL_TABLET | Freq: Every day | ORAL | Status: DC
  Administered 2014-11-17 – 2014-11-18 (×2): 2 mg via ORAL
  Filled 2014-11-17 (×2): qty 1

## 2014-11-17 MED ORDER — LACTATED RINGERS IV SOLN: INTRAVENOUS | Status: DC

## 2014-11-17 MED ORDER — POTASSIUM CHLORIDE CRYS ER 20 MEQ PO TBCR
40.0000 meq | EXTENDED_RELEASE_TABLET | Freq: Once | ORAL | Status: AC
  Administered 2014-11-17: 40 meq via ORAL
  Filled 2014-11-17: qty 2

## 2014-11-17 MED ORDER — ONDANSETRON HCL 4 MG/2ML IJ SOLN: 4.0000 mg | Freq: Four times a day (QID) | INTRAMUSCULAR | Status: DC | PRN

## 2014-11-17 MED ORDER — MUPIROCIN 2 % EX OINT
1.0000 "application " | TOPICAL_OINTMENT | Freq: Once | CUTANEOUS | Status: AC
  Administered 2014-11-17: 1 via TOPICAL

## 2014-11-17 MED ORDER — FERROUS GLUCONATE 324 (38 FE) MG PO TABS
324.0000 mg | ORAL_TABLET | Freq: Every day | ORAL | Status: DC
  Administered 2014-11-18 – 2014-11-22 (×4): 324 mg via ORAL
  Filled 2014-11-17 (×6): qty 1

## 2014-11-17 MED ORDER — HYDROMORPHONE HCL 1 MG/ML IJ SOLN: 0.2500 mg | INTRAMUSCULAR | Status: DC | PRN

## 2014-11-17 MED ORDER — METOCLOPRAMIDE HCL 5 MG/ML IJ SOLN: 5.0000 mg | Freq: Three times a day (TID) | INTRAMUSCULAR | Status: DC | PRN

## 2014-11-17 MED ORDER — PROPOFOL 10 MG/ML IV BOLUS
INTRAVENOUS | Status: AC
  Filled 2014-11-17: qty 20

## 2014-11-17 MED ORDER — GABAPENTIN 300 MG PO CAPS
300.0000 mg | ORAL_CAPSULE | Freq: Two times a day (BID) | ORAL | Status: DC
  Administered 2014-11-17 – 2014-11-18 (×2): 300 mg via ORAL
  Filled 2014-11-17 (×3): qty 1

## 2014-11-17 MED ORDER — FENTANYL CITRATE (PF) 250 MCG/5ML IJ SOLN
INTRAMUSCULAR | Status: AC
  Filled 2014-11-17: qty 5

## 2014-11-17 MED ORDER — SODIUM CHLORIDE 0.9 % IV SOLN
INTRAVENOUS | Status: DC
  Administered 2014-11-17: 15:00:00 via INTRAVENOUS

## 2014-11-17 MED ORDER — PANTOPRAZOLE SODIUM 20 MG PO TBEC
20.0000 mg | DELAYED_RELEASE_TABLET | Freq: Every day | ORAL | Status: DC
  Administered 2014-11-17 – 2014-11-22 (×6): 20 mg via ORAL
  Filled 2014-11-17 (×6): qty 1

## 2014-11-17 MED ORDER — FLUPHENAZINE HCL 1 MG PO TABS
1.0000 mg | ORAL_TABLET | Freq: Every day | ORAL | Status: DC
  Administered 2014-11-17 – 2014-11-22 (×5): 1 mg via ORAL
  Filled 2014-11-17 (×6): qty 1

## 2014-11-17 MED ORDER — LIDOCAINE HCL (CARDIAC) 20 MG/ML IV SOLN
INTRAVENOUS | Status: AC
  Filled 2014-11-17: qty 5

## 2014-11-17 SURGICAL SUPPLY — 8 items
BANDAGE ELASTIC 6 VELCRO ST LF (GAUZE/BANDAGES/DRESSINGS) ×3 IMPLANT
DRSG PAD ABDOMINAL 8X10 ST (GAUZE/BANDAGES/DRESSINGS) ×3 IMPLANT
PADDING CAST SYN 6 (CAST SUPPLIES) ×2
PADDING CAST SYNTHETIC 4 (CAST SUPPLIES) ×2
PADDING CAST SYNTHETIC 4X4 STR (CAST SUPPLIES) ×1 IMPLANT
PADDING CAST SYNTHETIC 6X4 NS (CAST SUPPLIES) ×1 IMPLANT
SPLINT PLASTER CAST XFAST 5X30 (CAST SUPPLIES) ×1 IMPLANT
SPLINT PLASTER XFAST SET 5X30 (CAST SUPPLIES) ×2

## 2014-11-17 NOTE — Anesthesia Postprocedure Evaluation (Signed)
  Anesthesia Post-op Note  Patient: Robin [name]  Procedure(s) Performed: Procedure(s) (LRB): CLOSED REDUCTION ANKLE (N/A)  Patient Location: PACU  Anesthesia Type: General  Level of Consciousness: awake and alert   Airway and Oxygen Therapy: Patient Spontanous Breathing  Post-op Pain: mild  Post-op Assessment: Post-op Vital signs reviewed, Patient's Cardiovascular Status Stable, Respiratory Function Stable, Patent Airway and No signs of Nausea or vomiting  Last Vitals:  Filed Vitals:   11/17/14 1658  BP: 151/63  Pulse:   Temp: 36.7 C  Resp:     Post-op Vital Signs: stable   Complications: No apparent anesthesia complications

## 2014-11-17 NOTE — Op Note (Signed)
Robin [name], Robin [name] NO.:  [id number]  MEDICAL RECORD NO.:  [id number]  LOCATION:  5N02C                        FACILITY:  [place]  PHYSICIAN:  Lockie Pares, M.D.    DATE OF BIRTH:  06-21-1950  DATE OF PROCEDURE:  11/17/2014 DATE OF DISCHARGE:                              OPERATIVE REPORT   INDICATIONS:  This is a 64 year old with injury several days ago with trimalleolar ankle fracture that was tentatively reduced by Dr. Percell Miller in our office unsuccessfully.  She was very swollen to operate on, had persistent posterior subluxation due to rather large posterior malleolus fracture, thought to be amenable to admission, hospitalization due to the fact that she has significant medical history with impending dialysis, and then we would perform a provisional closed reduction in anticipation of definitive surgery by Dr. Percell Miller later on in the next few days.  PREOPERATIVE DIAGNOSIS:  Trimalleolar ankle fracture with persistent posterior subluxation.  POSTOPERATIVE DIAGNOSIS:  Trimalleolar ankle fracture with persistent posterior subluxation.  OPERATION:  Closed reduction, trimalleolar ankle fracture.  SURGEON:  Lockie Pares, M.D.  ASSISTANT:  Chriss Czar, PA-C.  ANESTHESIA:  General anesthetic with ankle block.  DESCRIPTION OF PROCEDURE:  Pre-block, she was noted to be probably at least 50% dislocated relative to the body of the talus to the posterior tibia.  We could not completely anatomically reduce this due to the large posterior fragment.  We probably improved the positioning at least 80%, though based on the pre-manipulative C-arm, we confirmed reasonable reduction in the AP and lateral planes, placed a posterior and medial- lateral gutter splint on the ankle without difficulty.  She was taken to recovery room in stable condition.     Lockie Pares, M.D.     WDC/MEDQ  D:  11/17/2014  T:  11/17/2014  Job:  [id number]

## 2014-11-17 NOTE — Transfer of Care (Signed)
Immediate Anesthesia Transfer of Care Note  Patient: Robin [name]  Procedure(s) Performed: Procedure(s): CLOSED REDUCTION ANKLE (N/A)  Patient Location: PACU  Anesthesia Type:General  Level of Consciousness: awake  Airway & Oxygen Therapy: Patient Spontanous Breathing and Patient connected to nasal cannula oxygen  Post-op Assessment: Report given to RN and Post -op Vital signs reviewed and stable  Post vital signs: stable  Last Vitals:  Filed Vitals:   11/17/14 1427  BP: 182/68  Pulse: 90  Temp: 37.6 C  Resp: 18    Complications: No apparent anesthesia complications

## 2014-11-17 NOTE — H&P (Signed)
Robin [name] is an 64 y.o. female.   Chief Complaint: left ankle fracture dislocation HPI: 64 yo female fell at home getting out of bed 3 days ago had some pain but was still weight bearing, had another fall 2 days ado and felt a "pop".  Increasing swelling and pain patient was unable to bear weight seen in our outpatient office xrays reveal trimalleolar ankle fracture dislocation closed, unable to reduce in the office sent to hospital for closed reduction and will be admitted directly to medicine following reduction for multiple medical comorbidities.  Past Medical History  Diagnosis Date  . Coronary artery disease   . Bipolar disorder   . Anemia   . History of blood transfusion     Past Surgical History  Procedure Laterality Date  . Cardiac catheterization      2 stent   . Av fistula placement Left may-2016    done at [facility]  . Tonsillectomy    . Abdominal hysterectomy    . Cholecystectomy      No family history on file. Social History:  reports that she has been smoking.  She does not have any smokeless tobacco history on file. She reports that she does not drink alcohol or use illicit drugs.  Allergies: No Known Allergies  Medications Prior to Admission  Medication Sig Dispense Refill  . albuterol (PROVENTIL HFA;VENTOLIN HFA) 108 (90 BASE) MCG/ACT inhaler Inhale 1-2 puffs into the lungs every 6 (six) hours as needed for wheezing or shortness of breath.    Marland Kitchen amLODipine (NORVASC) 10 MG tablet Take 10 mg by mouth daily.    . benztropine (COGENTIN) 0.5 MG tablet Take 0.5 mg by mouth 2 (two) times daily.    . bumetanide (BUMEX) 2 MG tablet Take 2 mg by mouth daily.    . carvedilol (COREG) 25 MG tablet Take 25 mg by mouth 2 (two) times daily with a meal.    . ferrous gluconate (FERGON) 324 MG tablet Take 324 mg by mouth daily with breakfast.    . fluPHENAZine (PROLIXIN) 1 MG tablet Take 1 mg by mouth daily.    Marland Kitchen gabapentin (NEURONTIN) 300 MG capsule Take 300 mg by mouth 2 (two)  times daily.    . hydrALAZINE (APRESOLINE) 100 MG tablet Take 100 mg by mouth 2 (two) times daily.    . isosorbide mononitrate (IMDUR) 60 MG 24 hr tablet Take 60 mg by mouth daily.    . Multiple Vitamins-Minerals (MULTIVITAMIN WITH MINERALS) tablet Take 1 tablet by mouth daily.    . pantoprazole (PROTONIX) 20 MG tablet Take 20 mg by mouth daily.    Marland Kitchen PRESCRIPTION MEDICATION Take 0.5 tablets by mouth every evening.    Marland Kitchen QUEtiapine (SEROQUEL) 300 MG tablet Take 600 mg by mouth at bedtime.      Results for orders placed or performed during the hospital encounter of 11/17/14 (from the past 48 hour(s))  Glucose, capillary     Status: Abnormal   Collection Time: 11/17/14  2:35 PM  Result Value Ref Range   Glucose-Capillary 353 (H) 65 - 99 mg/dL  I-STAT 4, (NA,K, GLUC, HGB,HCT)     Status: Abnormal   Collection Time: 11/17/14  2:57 PM  Result Value Ref Range   Sodium 136 135 - 145 mmol/L   Potassium 3.3 (L) 3.5 - 5.1 mmol/L   Glucose, Bld 367 (H) 65 - 99 mg/dL   HCT 31.0 (L) 36.0 - 46.0 %   Hemoglobin 10.5 (L) 12.0 - 15.0  g/dL   No results found.  Review of Systems  Constitutional: Negative for fever and chills.  Eyes: Negative for blurred vision and double vision.  Respiratory: Negative for shortness of breath.   Cardiovascular: Negative for chest pain.  Gastrointestinal: Negative for nausea, vomiting and abdominal pain.  Genitourinary: Negative for dysuria.  Musculoskeletal: Positive for joint pain and falls.  Skin: Negative for rash.  Neurological: Negative for dizziness, loss of consciousness and headaches.    Blood pressure 182/68, pulse 90, temperature 99.6 F (37.6 C), temperature source Oral, resp. rate 18, height 5\' 4"  (1.626 m), weight 83.915 kg (185 lb), SpO2 96 %. Physical Exam  Constitutional: She is oriented to person, place, and time. She appears well-developed. No distress.  HENT:  Head: Normocephalic and atraumatic.  Nose: Nose normal.  Eyes: EOM are normal.   Neck: Normal range of motion.  Cardiovascular: Normal rate, regular rhythm, normal heart sounds and intact distal pulses.   Respiratory: Breath sounds normal. No respiratory distress. She has no wheezes.  GI: Soft. Bowel sounds are normal. She exhibits no distension. There is no tenderness.  Musculoskeletal:       Left ankle: She exhibits decreased range of motion and swelling. Tenderness.  Neurological: She is alert and oriented to person, place, and time.  Skin: Skin is warm. No rash noted. No erythema.  Psychiatric: She has a normal mood and affect. Her behavior is normal.     Assessment/Plan Left ankle closed fracture dislocation  Discussed risks and benefits of closed reduction left ankle fracture patient wishes to proceed.  Will admit to med team following reduction.  NWB LLE.  Chriss Czar 11/17/2014, 3:37 PM

## 2014-11-17 NOTE — Progress Notes (Signed)
Dr Conley Canal MD/[facility] in for interview/ medical management.

## 2014-11-17 NOTE — Progress Notes (Addendum)
[facility] History and Physical  Robin [name] [id number] DOB: 1950-07-16 DOA: 11/17/2014  Referring physician: French Ana PCP: at [facility]   Chief Complaint: broken ankle  HPI: Robin [name] is a 64 y.o. female  With history of bipolar disorder, diabetes, chronic kidney disease, hypertension, coronary artery disease with stent, previous history of cardiomyopathy had reduction of left ankle sure in the operating room today. Patient fell several days ago sustained an injury but delayed presentation to the orthopedic office for several days. Hospitalists are asked to admit patient, as she has multiple medical problems. She is a poor historian, some history is per old records, some per her daughter.   Review of Systems:  Difficult do to patient factors. Denies dyspnea pain or nausea.  Past Medical History  Diagnosis Date  . Coronary artery disease   . Bipolar disorder   . Anemia   . History of blood transfusion   . DM type 2, uncontrolled, with renal complications   . Benign hypertension   CKD  Past Surgical History  Procedure Laterality Date  . Cardiac catheterization      2 stent   . Av fistula placement Left may-2016    done at [facility]  . Tonsillectomy    . Abdominal hysterectomy    . Cholecystectomy     Social History:  reports that she has been smoking.  She does not have any smokeless tobacco history on file. She reports that she does not drink alcohol or use illicit drugs. Daughter reports there is a caregiver who lives with the patient and assists patient with medications  No Known Allergies  Family history: Per daughter, diabetes in multiple family members  Prior to Admission medications   Medication Sig Start Date End Date Taking? Authorizing Provider  albuterol (PROVENTIL HFA;VENTOLIN HFA) 108 (90 BASE) MCG/ACT inhaler Inhale 1-2 puffs into the lungs every 6 (six) hours as needed for wheezing or shortness of breath.   Yes Historical Provider, MD    amLODipine (NORVASC) 10 MG tablet Take 10 mg by mouth daily.   Yes Historical Provider, MD  benztropine (COGENTIN) 0.5 MG tablet Take 0.5 mg by mouth 2 (two) times daily.   Yes Historical Provider, MD  bumetanide (BUMEX) 2 MG tablet Take 2 mg by mouth daily.   Yes Historical Provider, MD  carvedilol (COREG) 25 MG tablet Take 25 mg by mouth 2 (two) times daily with a meal.   Yes Historical Provider, MD  ferrous gluconate (FERGON) 324 MG tablet Take 324 mg by mouth daily with breakfast.   Yes Historical Provider, MD  fluPHENAZine (PROLIXIN) 1 MG tablet Take 1 mg by mouth daily.   Yes Historical Provider, MD  gabapentin (NEURONTIN) 300 MG capsule Take 300 mg by mouth 2 (two) times daily.   Yes Historical Provider, MD  hydrALAZINE (APRESOLINE) 100 MG tablet Take 100 mg by mouth 2 (two) times daily.   Yes Historical Provider, MD  isosorbide mononitrate (IMDUR) 60 MG 24 hr tablet Take 60 mg by mouth daily.   Yes Historical Provider, MD  Multiple Vitamins-Minerals (MULTIVITAMIN WITH MINERALS) tablet Take 1 tablet by mouth daily.   Yes Historical Provider, MD  pantoprazole (PROTONIX) 20 MG tablet Take 20 mg by mouth daily.   Yes Historical Provider, MD  PRESCRIPTION MEDICATION Take 0.5 tablets by mouth every evening.   Yes Historical Provider, MD  QUEtiapine (SEROQUEL) 300 MG tablet Take 600 mg by mouth at bedtime.   Yes Historical Provider, MD  Lantus and  NovoLog, doses unknown  Physical Exam: Filed Vitals:   11/17/14 1625 11/17/14 1630 11/17/14 1645 11/17/14 1658  BP:    151/63  Pulse:  86 83   Temp: 99.5 F (37.5 C)   98 F (36.7 C)  TempSrc:      Resp:  9 14   Height:      Weight:      SpO2:  97% 95%     Wt Readings from Last 3 Encounters:  11/17/14 83.915 kg (185 lb)   BP 154/61 mmHg  Pulse 85  Temp(Src) 98.3 F (36.8 C) (Oral)  Resp 16  Ht 5\' 4"  (1.626 m)  Wt 83.915 kg (185 lb)  BMI 31.74 kg/m2  SpO2 94%  General Appearance:    Alert, cooperative, no distress, speech is  hesitant and patient unable to answer many questions about her medical history   Head:    Normocephalic, without obvious abnormality, atraumatic  Eyes:    PERRL, conjunctiva/corneas clear, EOM's intact,   Nose:   Nares normal, septum midline, no drainage    or sinus tenderness  Throat:   Lips, mucosa, and tongue normal; teeth and gums normal  Neck:   Supple, symmetrical, trachea midline, no adenopathy;    thyroid:  no enlargement/tenderness/nodules; no carotid   bruit or JVD  Back:     Symmetric, no curvature, ROM normal, no CVA tenderness  Lungs:     Clear to auscultation bilaterally, respirations unlabored  Chest Wall:    No tenderness or deformity   Heart:    Regular rate and rhythm, S1 and S2 normal, no murmur, rub   or gallop  Abdomen:     Soft, non-tender, bowel sounds active obese  Genitalia:    deferred  Rectal:    deferred  Extremities:   left leg and foot with splint. Right leg without edema. Left upper extremity with AV fistula and palpable thrill.   Pulses:   2+ and symmetric all extremities  Skin:   Skin color, texture, turgor normal, no rashes or lesions  Lymph nodes:   Cervical, supraclavicular, and axillary nodes normal  Neurologic:   CNII-XII intact, normal strength, sensation and reflexes    throughout    Psychiatric: Flat affect. Cooperative. Poor eye contact.        Labs on Admission:  Basic Metabolic Panel:  Recent Labs Lab 11/17/14 1457  NA 136  K 3.3*  GLUCOSE 367*   Liver Function Tests: No results for input(s): AST, ALT, ALKPHOS, BILITOT, PROT, ALBUMIN in the last 168 hours. No results for input(s): LIPASE, AMYLASE in the last 168 hours. No results for input(s): AMMONIA in the last 168 hours. CBC:  Recent Labs Lab 11/17/14 1457  HGB 10.5*  HCT 31.0*   Cardiac Enzymes: No results for input(s): CKTOTAL, CKMB, CKMBINDEX, TROPONINI in the last 168 hours.  BNP (last 3 results) No results for input(s): BNP in the last 8760 hours.  ProBNP (last  3 results) No results for input(s): PROBNP in the last 8760 hours.  CBG:  Recent Labs Lab 11/17/14 1435 11/17/14 1633  GLUCAP 353* 311*    Radiological Exams on Admission: No results found.  EKG: Tracing reviewed. Normal sinus rhythm. Left axis deviation.  Assessment/Plan Principal Problem:   Fracture dislocation of ankle:  Reduced in the OR today. I am told Dr. Edmonia Lynch will likely operate on Monday. Will check preoperative chest x-ray. Needs baseline labs and echocardiogram preoperatively.  Subcutaneous heparin for DVT prophylaxis for now. Nonweightbearing for  now. Active Problems:   DM type 2, uncontrolled, with renal complications:  Blood glucose greater than 300. Will give sliding scale insulin for now. Have asked patient's daughter to clarify her Lantus and NovoLog regimen. Check hemoglobin A1c.   Hypokalemia: Will replete.   Essential hypertension, benign:  Resume outpatient medications   Obesity   CAD (coronary artery disease): No reports of angina, but history is difficult. Previous echocardiograms with cardiomyopathy. Will resume diuretics.  Check echocardiogram. Will need judicious IV fluids perioperatively. Chronic kidney disease: Patient has a AV fistula that was placed about a month ago. Not yet on dialysis. Will check renal panel.  Code Status: full Family Communication: daughter by phone Disposition Plan: Will likely need 4-5 day admission and possibly skilled nursing facility  Time spent: 75 min  [facility]

## 2014-11-17 NOTE — Progress Notes (Signed)
Per Dr. Orene Desanctis, pt will not receive nerve block.

## 2014-11-17 NOTE — Anesthesia Preprocedure Evaluation (Signed)
Anesthesia Evaluation  Patient identified by MRN, date of birth, ID band Patient awake    Reviewed: Allergy & Precautions, NPO status , Patient's Chart, lab work & pertinent test results  Airway Mallampati: II       Dental  (+) Teeth Intact   Pulmonary Current Smoker,  breath sounds clear to auscultation        Cardiovascular + CAD Rhythm:Regular Rate:Normal  Abn EKG    Neuro/Psych    GI/Hepatic   Endo/Other  diabetes, Poorly Controlled  Renal/GU Renal Insufficiency and CRFRenal disease     Musculoskeletal  (+) Arthritis -,   Abdominal   Peds  Hematology  (+) anemia ,   Anesthesia Other Findings   Reproductive/Obstetrics                             Anesthesia Physical Anesthesia Plan  ASA: III and emergent  Anesthesia Plan: General   Post-op Pain Management:    Induction: Intravenous  Airway Management Planned: Oral ETT  Additional Equipment:   Intra-op Plan:   Post-operative Plan: Extubation in OR  Informed Consent: I have reviewed the patients History and Physical, chart, labs and discussed the procedure including the risks, benefits and alternatives for the proposed anesthesia with the patient or authorized representative who has indicated his/her understanding and acceptance.   Dental advisory given  Plan Discussed with: CRNA and Surgeon  Anesthesia Plan Comments:         Anesthesia Quick Evaluation

## 2014-11-18 ENCOUNTER — Inpatient Hospital Stay (HOSPITAL_COMMUNITY): Payer: Medicare Other

## 2014-11-18 DIAGNOSIS — I429 Cardiomyopathy, unspecified: Secondary | ICD-10-CM

## 2014-11-18 DIAGNOSIS — I5042 Chronic combined systolic (congestive) and diastolic (congestive) heart failure: Secondary | ICD-10-CM

## 2014-11-18 DIAGNOSIS — I10 Essential (primary) hypertension: Secondary | ICD-10-CM

## 2014-11-18 DIAGNOSIS — R410 Disorientation, unspecified: Secondary | ICD-10-CM

## 2014-11-18 DIAGNOSIS — E1159 Type 2 diabetes mellitus with other circulatory complications: Secondary | ICD-10-CM

## 2014-11-18 DIAGNOSIS — E1129 Type 2 diabetes mellitus with other diabetic kidney complication: Secondary | ICD-10-CM

## 2014-11-18 DIAGNOSIS — E1165 Type 2 diabetes mellitus with hyperglycemia: Secondary | ICD-10-CM

## 2014-11-18 DIAGNOSIS — S82899D Other fracture of unspecified lower leg, subsequent encounter for closed fracture with routine healing: Secondary | ICD-10-CM

## 2014-11-18 HISTORY — DX: Disorientation, unspecified: R41.0

## 2014-11-18 HISTORY — DX: Type 2 diabetes mellitus with other circulatory complications: E11.59

## 2014-11-18 HISTORY — DX: Chronic combined systolic (congestive) and diastolic (congestive) heart failure: I50.42

## 2014-11-18 LAB — COMPREHENSIVE METABOLIC PANEL
ALK PHOS: 71 U/L (ref 38–126)
ALT: 13 U/L — AB (ref 14–54)
ANION GAP: 9 (ref 5–15)
AST: 17 U/L (ref 15–41)
Albumin: 2.6 g/dL — ABNORMAL LOW (ref 3.5–5.0)
BUN: 47 mg/dL — ABNORMAL HIGH (ref 6–20)
CALCIUM: 8.5 mg/dL — AB (ref 8.9–10.3)
CO2: 21 mmol/L — ABNORMAL LOW (ref 22–32)
CREATININE: 3.89 mg/dL — AB (ref 0.44–1.00)
Chloride: 104 mmol/L (ref 101–111)
GFR calc Af Amer: 13 mL/min — ABNORMAL LOW (ref >60)
GFR calc non Af Amer: 11 mL/min — ABNORMAL LOW (ref >60)
Glucose, Bld: 236 mg/dL — ABNORMAL HIGH (ref 65–99)
Potassium: 4 mmol/L (ref 3.5–5.1)
Sodium: 134 mmol/L — ABNORMAL LOW (ref 135–145)
Total Bilirubin: 0.5 mg/dL (ref 0.3–1.2)
Total Protein: 6 g/dL — ABNORMAL LOW (ref 6.5–8.1)

## 2014-11-18 LAB — GLUCOSE, CAPILLARY
GLUCOSE-CAPILLARY: 160 mg/dL — AB (ref 65–99)
Glucose-Capillary: 215 mg/dL — ABNORMAL HIGH (ref 65–99)
Glucose-Capillary: 168 mg/dL — ABNORMAL HIGH (ref 65–99)
Glucose-Capillary: 192 mg/dL — ABNORMAL HIGH (ref 65–99)

## 2014-11-18 LAB — URINE MICROSCOPIC-ADD ON

## 2014-11-18 LAB — VITAMIN B12: Vitamin B-12: 2156 pg/mL — ABNORMAL HIGH (ref 180–914)

## 2014-11-18 LAB — URINALYSIS, ROUTINE W REFLEX MICROSCOPIC
Bilirubin Urine: NEGATIVE
Glucose, UA: 100 mg/dL — AB
HGB URINE DIPSTICK: NEGATIVE
Ketones, ur: NEGATIVE mg/dL
LEUKOCYTES UA: NEGATIVE
NITRITE: NEGATIVE
PH: 5 (ref 5.0–8.0)
PROTEIN: 100 mg/dL — AB
SPECIFIC GRAVITY, URINE: 1.012 (ref 1.005–1.030)
Urobilinogen, UA: 0.2 mg/dL (ref 0.0–1.0)

## 2014-11-18 LAB — AMMONIA: AMMONIA: 18 umol/L (ref 9–35)

## 2014-11-18 LAB — HEMOGLOBIN A1C
HEMOGLOBIN A1C: 8.7 % — AB (ref 4.8–5.6)
Mean Plasma Glucose: 203 mg/dL

## 2014-11-18 LAB — TSH: TSH: 1.591 u[IU]/mL (ref 0.350–4.500)

## 2014-11-18 MED ORDER — INSULIN DETEMIR 100 UNIT/ML ~~LOC~~ SOLN
5.0000 [IU] | Freq: Every day | SUBCUTANEOUS | Status: DC
  Administered 2014-11-18 – 2014-11-19 (×2): 5 [IU] via SUBCUTANEOUS
  Filled 2014-11-18 (×3): qty 0.05

## 2014-11-18 MED ORDER — HYDRALAZINE HCL 25 MG PO TABS
25.0000 mg | ORAL_TABLET | Freq: Two times a day (BID) | ORAL | Status: DC
  Administered 2014-11-18 – 2014-11-22 (×6): 25 mg via ORAL
  Filled 2014-11-18 (×7): qty 1

## 2014-11-18 NOTE — Progress Notes (Signed)
Subjective: 1 Day Post-Op Procedure(s) (LRB): CLOSED REDUCTION ANKLE (N/A) Patient reports pain as moderate.    Objective: Vital signs in last 24 hours: Temp:  [98 F (36.7 C)-99.6 F (37.6 C)] 98.2 F (36.8 C) (06/25 0546) Pulse Rate:  [77-90] 77 (06/25 0546) Resp:  [9-18] 16 (06/25 0546) BP: (124-182)/(60-89) 126/61 mmHg (06/25 0546) SpO2:  [94 %-100 %] 100 % (06/25 0546) Weight:  [83.915 kg (185 lb)] 83.915 kg (185 lb) (06/24 1427)  Intake/Output from previous day: 06/24 0701 - 06/25 0700 In: 400 [I.V.:400] Out: 300 [Urine:300] Intake/Output this shift:     Recent Labs  11/17/14 1457 11/17/14 1910  HGB 10.5* 9.4*    Recent Labs  11/17/14 1457 11/17/14 1910  WBC  --  7.5  RBC  --  3.17*  HCT 31.0* 29.7*  PLT  --  213    Recent Labs  11/17/14 1910 11/18/14 0433  NA 136 134*  K 3.7 4.0  CL 105 104  CO2 21* 21*  BUN 44* 47*  CREATININE 3.78* 3.89*  GLUCOSE 328* 236*  CALCIUM 8.6* 8.5*   No results for input(s): LABPT, INR in the last 72 hours.  Neurovascular intact Sensation intact distally Incision: dressing C/D/I  Assessment/Plan: 1 Day Post-Op Procedure(s) (LRB): CLOSED REDUCTION ANKLE (N/A) NWB LLE in splint, up with assistance Elevate leg to help reduce swelling Plan for OR mon or tues with Dr. Fredonia Highland for definitive fixation ORIF surg clearance per medicine team appreciate participation in care   Chriss Czar 11/18/2014, 11:14 AM

## 2014-11-18 NOTE — Progress Notes (Addendum)
PROGRESS NOTE  Robin [name] [id number] DOB: 1950/12/02 DOA: 11/17/2014 PCP: No primary care provider on file.  brief history  64 year old female with a history of diabetes mellitus type 2, hypertension, coronary artery disease with stent, CKD stage IV, bipolar disorder presented from her orthopedist's office for close reduction of her left ankle fracture.   Apparently, the patient followed by 3 days prior to admission , but was still bearing weight. The patient had another fall and felt a pop. She had delayed presentation to the orthopedic office where x-rays revealed a trimalleolar ankle fracture and dislocation. The patient was admitted directly for close reduction. Assessment/Plan: Delirium - suspect residual effects from the patient's conscious sedation -given pt's recent falls-->CT brain - UA and urine culture - Check TSH, serum B12, ammonia, urine drug screen - Minimize Hypnotic and psychotropic medications if possible -d/c gabapentin  diabetes mellitus type 2 - apparently patient was taking Levemir 55 units twice a day at home - daughter questions the patient's compliance - check hemoglobin A1c - start low-dose Levemir - NovoLog sliding scale  Hypertension - continue amlodipine and carvedilol - blood pressure controlled -  Decrease dose of hydralazine  CKD stage IV - Baseline creatinine 3.2-3.5 - hold Bumex  Chronic systolic and diastolic CHF - repeat echocardiogram - last echocardiogram 11/29/2009 shows EF [id number] percent, diastolic dysfunction - appears compensated at this time - continue Imdur  bipolar disorder - continue Seroquel , Cogentin, Prolixin Trimalleolar ankle fracture -plans by ortho for internal fixation on 6/27   Family Communication:   Daughter updated on phone--total time 35 min; >50% spent counseling and coordinating care Disposition Plan:   Home when cleared by ortho       Procedures/Studies: Dg Chest Port 1 View  11/17/2014    CLINICAL DATA:  Admission today for left leg fracture. History of hypertension diabetes. No sleep apnea. History of smoking.  EXAM: PORTABLE CHEST - 1 VIEW  COMPARISON:  11/28/2009  FINDINGS: The heart is mildly enlarged. There are no focal consolidations or pleural effusions. No pulmonary edema. Overlying the left lateral sixth rib and left scapula there is a density of uncertain significance. This may represent confluence of normal structures but follow-up PA and lateral chest may be helpful in excluding a lung nodule.  IMPRESSION: 1. Cardiomegaly without edema. 2. Density overlying the left lung of uncertain significance. Because a lung nodule cannot be excluded a follow-up PA and lateral chest x-ray is recommended when the patient is able. Alternatively chest CT can be performed.   Electronically Signed   By: Nolon Nations M.D.   On: 11/17/2014 18:42         Subjective:  patient is pleasant and confused but denies any fevers, chills, chest pain, shortness breath, nausea, vomiting, diarrhea, abdominal pain, dysuria, hematuria.  Objective: Filed Vitals:   11/17/14 1933 11/17/14 2000 11/18/14 0023 11/18/14 0546  BP: 130/89  124/60 126/61  Pulse: 80  79 77  Temp: 98.2 F (36.8 C)  98.3 F (36.8 C) 98.2 F (36.8 C)  TempSrc: Oral  Oral Axillary  Resp: 17 18 16 16   Height:      Weight:      SpO2: 94%  98% 100%    Intake/Output Summary (Last 24 hours) at 11/18/14 1352 Last data filed at 11/18/14 0826  Gross per 24 hour  Intake    400 ml  Output    300 ml  Net  100 ml   Weight change:  Exam:   General:  Pt is alert, follows commands appropriately, not in acute distress  HEENT: No icterus, No thrush, No neck mass, /AT; no meningismus  Cardiovascular: RRR, S1/S2, no rubs, no gallops  Respiratory: CTA bilaterally, no wheezing, no crackles, no rhonchi  Abdomen: Soft/+BS, non tender, non distended, no guarding;  No hepatomegaly  Extremities: No edema, No lymphangitis, No  petechiae, No rashes, no synovitis; left ankle in splint  Data Reviewed: Basic Metabolic Panel:  Recent Labs Lab 11/17/14 1457 11/17/14 1910 11/18/14 0433  NA 136 136 134*  K 3.3* 3.7 4.0  CL  --  105 104  CO2  --  21* 21*  GLUCOSE 367* 328* 236*  BUN  --  44* 47*  CREATININE  --  3.78* 3.89*  CALCIUM  --  8.6* 8.5*  MG  --  2.0  --   PHOS  --  3.9  --    Liver Function Tests:  Recent Labs Lab 11/18/14 0433  AST 17  ALT 13*  ALKPHOS 71  BILITOT 0.5  PROT 6.0*  ALBUMIN 2.6*   No results for input(s): LIPASE, AMYLASE in the last 168 hours. No results for input(s): AMMONIA in the last 168 hours. CBC:  Recent Labs Lab 11/17/14 1457 11/17/14 1910  WBC  --  7.5  HGB 10.5* 9.4*  HCT 31.0* 29.7*  MCV  --  93.7  PLT  --  213   Cardiac Enzymes: No results for input(s): CKTOTAL, CKMB, CKMBINDEX, TROPONINI in the last 168 hours. BNP: Invalid input(s): POCBNP CBG:  Recent Labs Lab 11/17/14 1435 11/17/14 1633 11/17/14 2122 11/18/14 0617 11/18/14 1204  GLUCAP 353* 311* 311* 215* 168*    Recent Results (from the past 240 hour(s))  Surgical pcr screen     Status: None   Collection Time: 11/17/14  3:40 PM  Result Value Ref Range Status   MRSA, PCR NEGATIVE NEGATIVE Final   Staphylococcus aureus NEGATIVE NEGATIVE Final    Comment:        The Xpert SA Assay (FDA approved for NASAL specimens in patients over 28 years of age), is one component of a comprehensive surveillance program.  Test performance has been validated by [facility] for patients greater than or equal to 55 year old. It is not intended to diagnose infection nor to guide or monitor treatment.      Scheduled Meds: . amLODipine  10 mg Oral Daily  . benztropine  0.5 mg Oral BID  . bumetanide  2 mg Oral Daily  . carvedilol  25 mg Oral BID WC  . docusate sodium  100 mg Oral BID  . ferrous gluconate  324 mg Oral Q breakfast  . fluPHENAZine  1 mg Oral Daily  . gabapentin  300 mg Oral BID   . heparin  5,000 Units Subcutaneous 3 times per day  . hydrALAZINE  100 mg Oral BID  . insulin aspart  0-15 Units Subcutaneous TID WC  . insulin aspart  0-5 Units Subcutaneous QHS  . isosorbide mononitrate  60 mg Oral Daily  . multivitamin with minerals  1 tablet Oral Daily  . pantoprazole  20 mg Oral Daily  . QUEtiapine  600 mg Oral QHS  . senna  1 tablet Oral BID   Continuous Infusions:    Yaslin Kirtley, DO  [facility] Pager [phone]  If 7PM-7AM, please contact night-coverage [email or web address] Password [email or web address] 11/18/2014, 1:52 PM   LOS: 1 day

## 2014-11-18 NOTE — Progress Notes (Signed)
  Echocardiogram 2D Echocardiogram has been performed.  Robin [name] 11/18/2014, 2:26 PM

## 2014-11-18 NOTE — Progress Notes (Signed)
Physical Therapy Evaluation Patient Details Name: Robin [name] MRN: [id number] DOB: 1951-01-26 Today's Date: 11/18/2014   History of Present Illness  64 yo female post fall with delayed ankle fracture ORIF.    Clinical Impression  Patient lethargic in morning, starting to become more alert, agreeable to PT evaluation.  Due to pain and lethargy, difficulty with WB compliance and mobility, requiring  2 person assist for transfer to bedside chair.  Unable to ambulate on evaluation.  Patient is appropriate for skilled PT services for mobility training, anticipate extended rehab process due to decreased assist available at home, and multi-story home needing to navigate stairs.     Follow Up Recommendations SNF    Equipment Recommendations  None recommended by PT    Recommendations for Other Services       Precautions / Restrictions Precautions Precautions: Fall Restrictions Weight Bearing Restrictions: Yes LLE Weight Bearing: Non weight bearing      Mobility  Bed Mobility Overal bed mobility: Needs Assistance;+2 for physical assistance Bed Mobility: Supine to Sit;Sit to Supine     Supine to sit: Mod assist;+2 for physical assistance Sit to supine: Mod assist;+2 for physical assistance   General bed mobility comments: Antalgic, LE management assist  Transfers Overall transfer level: Needs assistance Equipment used: Rolling walker (2 wheeled) Transfers: Sit to/from [facility] Sit to Stand: Mod assist;+2 physical assistance Stand pivot transfers: Max assist;+2 physical assistance       General transfer comment: Mild assist for L LE management, difficulty pivot on good leg.  Ambulation/Gait Ambulation/Gait assistance:  (Unable on evaluation)              Stairs            Wheelchair Mobility    Modified Rankin (Stroke Patients Only)       Balance Overall balance assessment: Needs assistance   Sitting balance-Leahy Scale: Fair        Standing balance-Leahy Scale: Poor                               Pertinent Vitals/Pain Pain Assessment: 0-10 Pain Score: 8  Pain Location: L ankle/foot Pain Descriptors / Indicators: Aching;Sore;Throbbing Pain Intervention(s): Limited activity within patient's tolerance;Monitored during session;Repositioned;Relaxation    Home Living Family/patient expects to be discharged to:: Skilled nursing facility                 Additional Comments: Patient lives in [place] home with stairs, will need extensive mobility rehab prior to able to return to prior home.    Prior Function Level of Independence: Independent               Hand Dominance        Extremity/Trunk Assessment   Upper Extremity Assessment: Overall WFL for tasks assessed           Lower Extremity Assessment: LLE deficits/detail   LLE Deficits / Details: Ankle in post op splint, decreased strength and increased pain     Communication   Communication: No difficulties (Groggy on evaluation)  Cognition Arousal/Alertness: Lethargic;Suspect due to medications (Able to participate with therapy) Behavior During Therapy: [facility] for tasks assessed/performed Overall Cognitive Status: Impaired/Different from baseline Area of Impairment:  (Lethargic/groggy)                    General Comments      Exercises        Assessment/Plan  PT Assessment Patient needs continued PT services  PT Diagnosis Difficulty walking;Acute pain   PT Problem List Decreased strength;Decreased activity tolerance;Decreased balance;Decreased mobility;Decreased knowledge of use of DME;Decreased knowledge of precautions;Pain  PT Treatment Interventions Gait training;Functional mobility training;Therapeutic activities;Therapeutic exercise;Patient/family education;Wheelchair mobility training   PT Goals (Current goals can be found in the Care Plan section) Acute Rehab PT Goals Patient Stated Goal: Not  stated, to get mobile again PT Goal Formulation: With patient Time For Goal Achievement: 12/02/14 Potential to Achieve Goals: Good    Frequency Min 3X/week   Barriers to discharge Inaccessible home environment;Decreased caregiver support      Co-evaluation               End of Session Equipment Utilized During Treatment: Gait belt Activity Tolerance: Patient limited by lethargy;Patient limited by pain Patient left: in bed;with call bell/phone within reach;with nursing/sitter in room Nurse Communication: Mobility status         Time: 1430-1500 PT Time Calculation (min) (ACUTE ONLY): 30 min   Charges:   PT Evaluation $Initial PT Evaluation Tier I: 1 Procedure PT Treatments $Therapeutic Activity: 8-22 mins   PT G Codes:        Semaj Coburn L 09-Dec-2014, 3:10 PM

## 2014-11-19 ENCOUNTER — Inpatient Hospital Stay (HOSPITAL_COMMUNITY): Payer: Medicare Other

## 2014-11-19 DIAGNOSIS — E876 Hypokalemia: Secondary | ICD-10-CM

## 2014-11-19 DIAGNOSIS — S82892G Other fracture of left lower leg, subsequent encounter for closed fracture with delayed healing: Secondary | ICD-10-CM

## 2014-11-19 LAB — COMPREHENSIVE METABOLIC PANEL
ALK PHOS: 65 U/L (ref 38–126)
ALT: 13 U/L — AB (ref 14–54)
ANION GAP: 9 (ref 5–15)
AST: 15 U/L (ref 15–41)
Albumin: 2.5 g/dL — ABNORMAL LOW (ref 3.5–5.0)
BILIRUBIN TOTAL: 0.5 mg/dL (ref 0.3–1.2)
BUN: 52 mg/dL — ABNORMAL HIGH (ref 6–20)
CALCIUM: 8.7 mg/dL — AB (ref 8.9–10.3)
CO2: 22 mmol/L (ref 22–32)
Chloride: 106 mmol/L (ref 101–111)
Creatinine, Ser: 4.27 mg/dL — ABNORMAL HIGH (ref 0.44–1.00)
GFR, EST AFRICAN AMERICAN: 12 mL/min — AB (ref >60)
GFR, EST NON AFRICAN AMERICAN: 10 mL/min — AB (ref >60)
GLUCOSE: 146 mg/dL — AB (ref 65–99)
Potassium: 3.6 mmol/L (ref 3.5–5.1)
Sodium: 137 mmol/L (ref 135–145)
Total Protein: 6.4 g/dL — ABNORMAL LOW (ref 6.5–8.1)

## 2014-11-19 LAB — GLUCOSE, CAPILLARY
GLUCOSE-CAPILLARY: 114 mg/dL — AB (ref 65–99)
GLUCOSE-CAPILLARY: 206 mg/dL — AB (ref 65–99)
Glucose-Capillary: 217 mg/dL — ABNORMAL HIGH (ref 65–99)
Glucose-Capillary: 257 mg/dL — ABNORMAL HIGH (ref 65–99)

## 2014-11-19 LAB — SODIUM, URINE, RANDOM: Sodium, Ur: 40 mmol/L

## 2014-11-19 LAB — CREATININE, URINE, RANDOM: Creatinine, Urine: 94.28 mg/dL

## 2014-11-19 MED ORDER — CEFAZOLIN SODIUM-DEXTROSE 2-3 GM-% IV SOLR
2.0000 g | INTRAVENOUS | Status: AC
  Administered 2014-11-20: 2 g via INTRAVENOUS
  Filled 2014-11-19: qty 50

## 2014-11-19 MED ORDER — ACETAMINOPHEN 500 MG PO TABS
1000.0000 mg | ORAL_TABLET | Freq: Once | ORAL | Status: DC
  Filled 2014-11-19: qty 2

## 2014-11-19 MED ORDER — SODIUM CHLORIDE 0.9 % IV SOLN
INTRAVENOUS | Status: AC
  Filled 2014-11-19: qty 1000

## 2014-11-19 MED ORDER — DEXTROSE-NACL 5-0.45 % IV SOLN
100.0000 mL/h | INTRAVENOUS | Status: DC
  Administered 2014-11-19: 100 mL/h via INTRAVENOUS

## 2014-11-19 NOTE — Progress Notes (Signed)
OT Cancellation Note  Patient Details Name: Robin [name] MRN: [id number] DOB: 11/11/1950   Cancelled Treatment:    Reason Eval/Treat Not Completed: Other (comment) Pt is Medicare and current D/C plan is SNF. No apparent immediate acute care OT needs, therefore will defer OT to SNF. If OT eval is needed please call Acute Rehab Dept. at [phone] or text page OT at [phone].      [name] [id number] 11/19/2014, 7:12 AM

## 2014-11-19 NOTE — Progress Notes (Signed)
PROGRESS NOTE  Robin [name] [id number] DOB: 22-Oct-1950 DOA: 11/17/2014 PCP: No primary care provider on file.    brief history 64 year old female with a history of diabetes mellitus type 2, hypertension, coronary artery disease with stent, CKD stage IV, bipolar disorder presented from her orthopedist's office for close reduction of her left ankle fracture. Apparently, the patient followed by 3 days prior to admission , but was still bearing weight. The patient had another fall and felt a pop. She had delayed presentation to the orthopedic office where x-rays revealed a trimalleolar ankle fracture and dislocation. The patient was admitted directly for close reduction. Assessment/Plan: Delirium - 6/26--improved - suspect residual effects from the patient's conscious sedation-->pt improved after 24 hrs -given pt's recent falls-->CT brain--> no acute findings - UA--negative for pyuria - Check TSH, serum B12, ammonia--all unremarkable - Minimize Hypnotic and psychotropic medications if possible -d/c gabapentin diabetes mellitus type 2 - apparently patient was taking Levemir 55 units twice a day at home - daughter questions the patient's compliance - check hemoglobin A1c--results pending - start low-dose Levemir--5 units daily--will not change today as the patient is scheduled to have surgery on 11/20/2014 - NovoLog sliding scale Hypertension - continue amlodipine and carvedilol - blood pressure controlled - Decrease dose of hydralazine Acute on chronic CKD stage IV - Baseline creatinine 3.2-3.5 - hold Bumex - give fluid challenge--give 1L -renal US Chronic systolic and diastolic CHF - repeat echocardiogram - last echocardiogram 11/29/2009 shows EF [id number] percent, diastolic dysfunction - appears compensated at this time - continue Imdur and hydralazine -11/18/2014 echo shows EF 55-60 percent, no WMA, grade diastolic dysfxn bipolar disorder - continue Seroquel ,  Cogentin, Prolixin Trimalleolar ankle fracture -plans by ortho for internal fixation on 6/27   Family Communication: Daughter updated on phone 6/25 Disposition Plan: Home when cleared by ortho   Procedures/Studies: Dg Ankle Complete Left  December 10, 2014   CLINICAL DATA:  Known left ankle fracture.  EXAM: LEFT ANKLE COMPLETE - 3+ VIEW  COMPARISON:  None.  FINDINGS: Exam detail is diminished secondary to casting material. There is a trimalleolar fracture and dislocation of the tibiotalar joint.  IMPRESSION: 1. Trimalleolar fracture and tibiotalar joint dislocation.   Electronically Signed   By: Kerby Moors M.D.   On: 12-10-2014 09:09   Ct Head Wo Contrast  11/18/2014   CLINICAL DATA:  Acute encephalopathy.  Delerium.   Multiple falls.  EXAM: CT HEAD WITHOUT CONTRAST  TECHNIQUE: Contiguous axial images were obtained from the base of the skull through the vertex without intravenous contrast.  COMPARISON:  Multiple exams, including 08/02/2009  FINDINGS: The brainstem, cerebellum, cerebral peduncles, thalamus, basal ganglia, basilar cisterns, and ventricular system appear within normal limits. No intracranial hemorrhage, mass lesion, or acute CVA.  There is atherosclerotic calcification of the cavernous carotid arteries bilaterally.  IMPRESSION: 1. No acute intracranial findings. 2. There is atherosclerotic calcification of the cavernous carotid arteries bilaterally.   Electronically Signed   By: Van Clines M.D.   On: 11/18/2014 15:34   Dg Chest Port 1 View  11/17/2014   CLINICAL DATA:  Admission today for left leg fracture. History of hypertension diabetes. No sleep apnea. History of smoking.  EXAM: PORTABLE CHEST - 1 VIEW  COMPARISON:  11/28/2009  FINDINGS: The heart is mildly enlarged. There are no focal consolidations or pleural effusions. No pulmonary edema. Overlying the left lateral sixth rib and left scapula there is a density of uncertain significance.  This may represent confluence of  normal structures but follow-up PA and lateral chest may be helpful in excluding a lung nodule.  IMPRESSION: 1. Cardiomegaly without edema. 2. Density overlying the left lung of uncertain significance. Because a lung nodule cannot be excluded a follow-up PA and lateral chest x-ray is recommended when the patient is able. Alternatively chest CT can be performed.   Electronically Signed   By: Nolon Nations M.D.   On: 11/17/2014 18:42         Subjective: Patient is more alert today. Denies any fevers, chills, chest pain, short of breath, nausea, vomiting, diarrhea, abdominal pain, dysuria, hematuria. No headaches or visual disturbance.   Objective: Filed Vitals:   11/18/14 0546 11/18/14 2006 11/18/14 2242 11/19/14 0647  BP: 126/61 146/66 140/59 143/64  Pulse: 77 80  74  Temp: 98.2 F (36.8 C) 98.9 F (37.2 C)  97.7 F (36.5 C)  TempSrc: Axillary Axillary  Axillary  Resp: 16 17  16   Height:      Weight:      SpO2: 100% 100%  100%    Intake/Output Summary (Last 24 hours) at 11/19/14 1717 Last data filed at 11/19/14 1507  Gross per 24 hour  Intake    120 ml  Output    600 ml  Net   -480 ml   Weight change:  Exam:   General:  Pt is alert, follows commands appropriately, not in acute distress  HEENT: No icterus, No thrush, No meningismus, [place]/AT  Cardiovascular: RRR, S1/S2, no rubs, no gallops  Respiratory: CTA bilaterally, no wheezing, no crackles, no rhonchi  Abdomen: Soft/+BS, non tender, non distended, no guarding Extremities: left lower extremity in a splint. Right lower extremity without any cyanosis or clubbing.  Data Reviewed: Basic Metabolic Panel:  Recent Labs Lab 11/17/14 1457 11/17/14 1910 11/18/14 0433 11/19/14 0401  NA 136 136 134* 137  K 3.3* 3.7 4.0 3.6  CL  --  105 104 106  CO2  --  21* 21* 22  GLUCOSE 367* 328* 236* 146*  BUN  --  44* 47* 52*  CREATININE  --  3.78* 3.89* 4.27*  CALCIUM  --  8.6* 8.5* 8.7*  MG  --  2.0  --   --   PHOS  --   3.9  --   --    Liver Function Tests:  Recent Labs Lab 11/18/14 0433 11/19/14 0401  AST 17 15  ALT 13* 13*  ALKPHOS 71 65  BILITOT 0.5 0.5  PROT 6.0* 6.4*  ALBUMIN 2.6* 2.5*   No results for input(s): LIPASE, AMYLASE in the last 168 hours.  Recent Labs Lab 11/18/14 1505  AMMONIA 18   CBC:  Recent Labs Lab 11/17/14 1457 11/17/14 1910  WBC  --  7.5  HGB 10.5* 9.4*  HCT 31.0* 29.7*  MCV  --  93.7  PLT  --  213   Cardiac Enzymes: No results for input(s): CKTOTAL, CKMB, CKMBINDEX, TROPONINI in the last 168 hours. BNP: Invalid input(s): POCBNP CBG:  Recent Labs Lab 11/18/14 1643 11/18/14 2116 11/19/14 0652 11/19/14 1202 11/19/14 1620  GLUCAP 160* 192* 114* 206* 217*    Recent Results (from the past 240 hour(s))  Surgical pcr screen     Status: None   Collection Time: 11/17/14  3:40 PM  Result Value Ref Range Status   MRSA, PCR NEGATIVE NEGATIVE Final   Staphylococcus aureus NEGATIVE NEGATIVE Final    Comment:        The  Xpert SA Assay (FDA approved for NASAL specimens in patients over 68 years of age), is one component of a comprehensive surveillance program.  Test performance has been validated by [facility] for patients greater than or equal to 31 year old. It is not intended to diagnose infection nor to guide or monitor treatment.   Urine culture     Status: None (Preliminary result)   Collection Time: 11/18/14  7:00 PM  Result Value Ref Range Status   Specimen Description URINE, CATHETERIZED  Final   Special Requests NONE  Final   Culture NO GROWTH < 24 HOURS  Final   Report Status PENDING  Incomplete     Scheduled Meds: . amLODipine  10 mg Oral Daily  . benztropine  0.5 mg Oral BID  . carvedilol  25 mg Oral BID WC  . docusate sodium  100 mg Oral BID  . ferrous gluconate  324 mg Oral Q breakfast  . fluPHENAZine  1 mg Oral Daily  . heparin  5,000 Units Subcutaneous 3 times per day  . hydrALAZINE  25 mg Oral Q12H  . insulin aspart   0-15 Units Subcutaneous TID WC  . insulin aspart  0-5 Units Subcutaneous QHS  . insulin detemir  5 Units Subcutaneous QHS  . isosorbide mononitrate  60 mg Oral Daily  . multivitamin with minerals  1 tablet Oral Daily  . pantoprazole  20 mg Oral Daily  . QUEtiapine  600 mg Oral QHS  . senna  1 tablet Oral BID   Continuous Infusions: . sodium chloride 0.9 % 1,000 mL with potassium chloride 20 mEq infusion       Hallie Ertl, DO  [facility] Pager [phone]  If 7PM-7AM, please contact night-coverage [email or web address] Password [email or web address] 11/19/2014, 5:17 PM   LOS: 2 days

## 2014-11-19 NOTE — Progress Notes (Signed)
Subjective: 2 Days Post-Op Procedure(s) (LRB): CLOSED REDUCTION ANKLE (N/A) Patient reports pain as mild.    Objective: Vital signs in last 24 hours: Temp:  [97.7 F (36.5 C)-98.9 F (37.2 C)] 97.7 F (36.5 C) (06/26 0647) Pulse Rate:  [74-80] 74 (06/26 0647) Resp:  [16-17] 16 (06/26 0647) BP: (140-146)/(59-66) 143/64 mmHg (06/26 0647) SpO2:  [100 %] 100 % (06/26 0647)  Intake/Output from previous day: 06/25 0701 - 06/26 0700 In: 170 [P.O.:170] Out: 1300 [Urine:1300] Intake/Output this shift:     Recent Labs  11/17/14 1457 11/17/14 1910  HGB 10.5* 9.4*    Recent Labs  11/17/14 1457 11/17/14 1910  WBC  --  7.5  RBC  --  3.17*  HCT 31.0* 29.7*  PLT  --  213    Recent Labs  11/18/14 0433 11/19/14 0401  NA 134* 137  K 4.0 3.6  CL 104 106  CO2 21* 22  BUN 47* 52*  CREATININE 3.89* 4.27*  GLUCOSE 236* 146*  CALCIUM 8.5* 8.7*   No results for input(s): LABPT, INR in the last 72 hours.  Neurovascular intact Sensation intact distally Incision: dressing C/D/I wiggles toes  Assessment/Plan: 2 Days Post-Op Procedure(s) (LRB): CLOSED REDUCTION ANKLE (N/A) NWB LLE in splint, up with assistance Elevate leg to help reduce swelling Plan for OR mon or tues with Dr. Fredonia Highland for definitive fixation ORIF surg clearance per medicine team appreciate participation in care  [place] 11/19/2014, 9:08 AM

## 2014-11-20 ENCOUNTER — Encounter (HOSPITAL_COMMUNITY): Payer: Self-pay | Admitting: Certified Registered Nurse Anesthetist

## 2014-11-20 ENCOUNTER — Inpatient Hospital Stay (HOSPITAL_COMMUNITY): Payer: Medicare Other | Admitting: Certified Registered Nurse Anesthetist

## 2014-11-20 ENCOUNTER — Encounter (HOSPITAL_COMMUNITY)
Admission: AD | Disposition: A | Discharge status: Skilled Nursing Facility | Payer: Self-pay | Source: Ambulatory Visit | Attending: Internal Medicine

## 2014-11-20 ENCOUNTER — Inpatient Hospital Stay (HOSPITAL_COMMUNITY): Payer: Medicare Other

## 2014-11-20 DIAGNOSIS — G9340 Encephalopathy, unspecified: Secondary | ICD-10-CM | POA: Insufficient documentation

## 2014-11-20 HISTORY — PX: ORIF ANKLE FRACTURE: SHX5408

## 2014-11-20 LAB — GLUCOSE, CAPILLARY
GLUCOSE-CAPILLARY: 256 mg/dL — AB (ref 65–99)
Glucose-Capillary: 213 mg/dL — ABNORMAL HIGH (ref 65–99)
Glucose-Capillary: 190 mg/dL — ABNORMAL HIGH (ref 65–99)
Glucose-Capillary: 182 mg/dL — ABNORMAL HIGH (ref 65–99)

## 2014-11-20 LAB — BASIC METABOLIC PANEL
Anion gap: 7 (ref 5–15)
BUN: 48 mg/dL — ABNORMAL HIGH (ref 6–20)
CALCIUM: 9 mg/dL (ref 8.9–10.3)
CO2: 23 mmol/L (ref 22–32)
CREATININE: 3.73 mg/dL — AB (ref 0.44–1.00)
Chloride: 106 mmol/L (ref 101–111)
GFR calc Af Amer: 14 mL/min — ABNORMAL LOW (ref >60)
GFR, EST NON AFRICAN AMERICAN: 12 mL/min — AB (ref >60)
GLUCOSE: 226 mg/dL — AB (ref 65–99)
Potassium: 3.9 mmol/L (ref 3.5–5.1)
SODIUM: 136 mmol/L (ref 135–145)

## 2014-11-20 LAB — URINE CULTURE: Culture: NO GROWTH

## 2014-11-20 LAB — RAPID HIV SCREEN (HIV 1/2 AB+AG)
HIV 1/2 ANTIBODIES: NONREACTIVE
HIV-1 P24 Antigen - HIV24: NONREACTIVE

## 2014-11-20 LAB — HEMOGLOBIN A1C
HEMOGLOBIN A1C: 8.3 % — AB (ref 4.8–5.6)
Mean Plasma Glucose: 192 mg/dL

## 2014-11-20 SURGERY — OPEN REDUCTION INTERNAL FIXATION (ORIF) ANKLE FRACTURE
Anesthesia: Spinal | Site: Ankle | Laterality: Left

## 2014-11-20 MED ORDER — 0.9 % SODIUM CHLORIDE (POUR BTL) OPTIME
TOPICAL | Status: DC | PRN
  Administered 2014-11-20: 1000 mL

## 2014-11-20 MED ORDER — SODIUM CHLORIDE 0.9 % IV SOLN
INTRAVENOUS | Status: DC
  Administered 2014-11-20: 14:00:00 via INTRAVENOUS

## 2014-11-20 MED ORDER — HYDROMORPHONE HCL 1 MG/ML IJ SOLN: 0.2500 mg | INTRAMUSCULAR | Status: DC | PRN

## 2014-11-20 MED ORDER — PROPOFOL INFUSION 10 MG/ML OPTIME
INTRAVENOUS | Status: DC | PRN
  Administered 2014-11-20: 40 ug/kg/min via INTRAVENOUS

## 2014-11-20 MED ORDER — CEFAZOLIN SODIUM-DEXTROSE 2-3 GM-% IV SOLR
2.0000 g | Freq: Four times a day (QID) | INTRAVENOUS | Status: AC
Start: 2014-11-20 — End: 2014-11-21
  Administered 2014-11-20 – 2014-11-21 (×3): 2 g via INTRAVENOUS
  Filled 2014-11-20 (×3): qty 50

## 2014-11-20 MED ORDER — CEFAZOLIN SODIUM-DEXTROSE 2-3 GM-% IV SOLR
INTRAVENOUS | Status: AC
  Filled 2014-11-20: qty 50

## 2014-11-20 MED ORDER — OXYCODONE-ACETAMINOPHEN 5-325 MG PO TABS: 1.0000 | ORAL_TABLET | ORAL | Status: DC | PRN

## 2014-11-20 MED ORDER — INSULIN ASPART 100 UNIT/ML ~~LOC~~ SOLN
3.0000 [IU] | Freq: Three times a day (TID) | SUBCUTANEOUS | Status: DC
  Administered 2014-11-22: 3 [IU] via SUBCUTANEOUS

## 2014-11-20 MED ORDER — LIDOCAINE HCL (CARDIAC) 20 MG/ML IV SOLN
INTRAVENOUS | Status: DC | PRN
  Administered 2014-11-20: 80 mg via INTRAVENOUS

## 2014-11-20 MED ORDER — PROPOFOL 10 MG/ML IV BOLUS
INTRAVENOUS | Status: AC
  Filled 2014-11-20: qty 20

## 2014-11-20 MED ORDER — ONDANSETRON HCL 4 MG PO TABS: 4.0000 mg | ORAL_TABLET | Freq: Three times a day (TID) | ORAL | Status: DC | PRN

## 2014-11-20 MED ORDER — OXYCODONE HCL 5 MG PO TABS
5.0000 mg | ORAL_TABLET | ORAL | Status: DC | PRN
  Administered 2014-11-20 – 2014-11-21 (×4): 10 mg via ORAL
  Filled 2014-11-20 (×4): qty 2

## 2014-11-20 MED ORDER — ROCURONIUM BROMIDE 50 MG/5ML IV SOLN
INTRAVENOUS | Status: AC
  Filled 2014-11-20: qty 1

## 2014-11-20 MED ORDER — PROPOFOL 10 MG/ML IV BOLUS
INTRAVENOUS | Status: DC | PRN
  Administered 2014-11-20: 30 mg via INTRAVENOUS
  Administered 2014-11-20: 20 mg via INTRAVENOUS

## 2014-11-20 MED ORDER — DOCUSATE SODIUM 100 MG PO CAPS: 100.0000 mg | ORAL_CAPSULE | Freq: Two times a day (BID) | ORAL | Status: DC

## 2014-11-20 MED ORDER — PROMETHAZINE HCL 25 MG/ML IJ SOLN: 6.2500 mg | INTRAMUSCULAR | Status: DC | PRN

## 2014-11-20 MED ORDER — ASPIRIN 325 MG PO TABS: 325.0000 mg | ORAL_TABLET | Freq: Every day | ORAL | Status: DC

## 2014-11-20 MED ORDER — BUPIVACAINE HCL (PF) 0.75 % IJ SOLN
INTRAMUSCULAR | Status: DC | PRN
  Administered 2014-11-20: 12 mg via INTRATHECAL

## 2014-11-20 MED ORDER — CEFAZOLIN SODIUM-DEXTROSE 2-3 GM-% IV SOLR
2.0000 g | Freq: Once | INTRAVENOUS | Status: DC
  Filled 2014-11-20: qty 50

## 2014-11-20 MED ORDER — INSULIN DETEMIR 100 UNIT/ML ~~LOC~~ SOLN
10.0000 [IU] | Freq: Every day | SUBCUTANEOUS | Status: DC
  Administered 2014-11-20 – 2014-11-21 (×2): 10 [IU] via SUBCUTANEOUS
  Filled 2014-11-20 (×4): qty 0.1

## 2014-11-20 MED ORDER — FENTANYL CITRATE (PF) 250 MCG/5ML IJ SOLN
INTRAMUSCULAR | Status: AC
  Filled 2014-11-20: qty 5

## 2014-11-20 SURGICAL SUPPLY — 79 items
BANDAGE ELASTIC 4 VELCRO ST LF (GAUZE/BANDAGES/DRESSINGS) ×6 IMPLANT
BANDAGE ESMARK 6X9 LF (GAUZE/BANDAGES/DRESSINGS) ×1 IMPLANT
BIT DRILL 2.5X125 (BIT) ×6 IMPLANT
BIT DRILL 3.5X125 (BIT) ×1 IMPLANT
BNDG CMPR 9X6 STRL LF SNTH (GAUZE/BANDAGES/DRESSINGS) ×1
BNDG ESMARK 6X9 LF (GAUZE/BANDAGES/DRESSINGS) ×3
CANISTER SUCTION 2500CC (MISCELLANEOUS) ×3 IMPLANT
CLOSURE WOUND 1/2 X4 (GAUZE/BANDAGES/DRESSINGS) ×1
COVER SURGICAL LIGHT HANDLE (MISCELLANEOUS) ×3 IMPLANT
CUFF TOURNIQUET SINGLE 34IN LL (TOURNIQUET CUFF) IMPLANT
DRAPE C-ARM 42X72 X-RAY (DRAPES) IMPLANT
DRAPE C-ARMOR (DRAPES) ×3 IMPLANT
DRAPE ORTHO SPLIT 77X108 STRL (DRAPES)
DRAPE SURG ORHT 6 SPLT 77X108 (DRAPES) IMPLANT
DRAPE U-SHAPE 47X51 STRL (DRAPES) IMPLANT
DRILL BIT 3.5X125 (BIT) ×3
DRSG ADAPTIC 3X8 NADH LF (GAUZE/BANDAGES/DRESSINGS) ×3 IMPLANT
DRSG PAD ABDOMINAL 8X10 ST (GAUZE/BANDAGES/DRESSINGS) ×3 IMPLANT
DURAPREP 26ML APPLICATOR (WOUND CARE) ×3 IMPLANT
ELECT REM PT RETURN 9FT ADLT (ELECTROSURGICAL) ×3
ELECTRODE REM PT RTRN 9FT ADLT (ELECTROSURGICAL) ×1 IMPLANT
GAUZE SPONGE 4X4 12PLY STRL (GAUZE/BANDAGES/DRESSINGS) ×3 IMPLANT
GLOVE BIO SURGEON STRL SZ7 (GLOVE) ×6 IMPLANT
GLOVE BIO SURGEON STRL SZ7.5 (GLOVE) ×9 IMPLANT
GLOVE BIO SURGEON STRL SZ8 (GLOVE) ×9 IMPLANT
GLOVE BIOGEL PI IND STRL 7.0 (GLOVE) ×1 IMPLANT
GLOVE BIOGEL PI IND STRL 8 (GLOVE) ×2 IMPLANT
GLOVE BIOGEL PI INDICATOR 7.0 (GLOVE) ×2
GLOVE BIOGEL PI INDICATOR 8 (GLOVE) ×4
GLOVE BIOGEL PI ORTHO PRO SZ8 (GLOVE) ×2
GLOVE PI ORTHO PRO STRL SZ8 (GLOVE) ×1 IMPLANT
GLOVE SS BIOGEL STRL SZ 7.5 (GLOVE) ×2 IMPLANT
GLOVE SUPERSENSE BIOGEL SZ 7.5 (GLOVE) ×4
GOWN STRL REUS W/ TWL LRG LVL3 (GOWN DISPOSABLE) ×1 IMPLANT
GOWN STRL REUS W/ TWL XL LVL3 (GOWN DISPOSABLE) ×1 IMPLANT
GOWN STRL REUS W/TWL 2XL LVL3 (GOWN DISPOSABLE) ×3 IMPLANT
GOWN STRL REUS W/TWL LRG LVL3 (GOWN DISPOSABLE) ×2
GOWN STRL REUS W/TWL XL LVL3 (GOWN DISPOSABLE) ×2
K-WIRE ORTHOPEDIC 1.4X150L (WIRE) ×9
KIT BASIN OR (CUSTOM PROCEDURE TRAY) ×3 IMPLANT
KIT ROOM TURNOVER OR (KITS) ×3 IMPLANT
KWIRE ORTHOPEDIC 1.4X150L (WIRE) ×3 IMPLANT
MANIFOLD NEPTUNE II (INSTRUMENTS) ×3 IMPLANT
NEEDLE 22X1 1/2 (OR ONLY) (NEEDLE) ×3 IMPLANT
NS IRRIG 1000ML POUR BTL (IV SOLUTION) ×3 IMPLANT
PACK ORTHO EXTREMITY (CUSTOM PROCEDURE TRAY) ×3 IMPLANT
PAD ARMBOARD 7.5X6 YLW CONV (MISCELLANEOUS) ×6 IMPLANT
PAD CAST 4YDX4 CTTN HI CHSV (CAST SUPPLIES) ×2 IMPLANT
PADDING CAST COTTON 4X4 STRL (CAST SUPPLIES) ×4
PLATE TUBUAL 1/3 6H (Plate) ×3 IMPLANT
SCREW CANC FT 16X4X2.5XHEX (Screw) ×1 IMPLANT
SCREW CANCELLOUS 4.0X14 (Screw) ×6 IMPLANT
SCREW CANCELLOUS 4.0X16MM (Screw) ×2 IMPLANT
SCREW CANN 4.0X46MM (Screw) ×3 IMPLANT
SCREW CANN 44X15X4XSLF DRL (Screw) ×2 IMPLANT
SCREW CANNULATED 4.0X44MM (Screw) ×4 IMPLANT
SCREW CORTEX ST MATTA 3.5X14 (Screw) ×6 IMPLANT
SCREW CORTEX ST MATTA 3.5X16MM (Screw) ×3 IMPLANT
SCREW CORTEX ST MATTA 3.5X18MM (Screw) ×3 IMPLANT
SPLINT PLASTER CAST XFAST 5X30 (CAST SUPPLIES) ×2 IMPLANT
SPLINT PLASTER XFAST SET 5X30 (CAST SUPPLIES) ×4
SPONGE GAUZE 4X4 12PLY STER LF (GAUZE/BANDAGES/DRESSINGS) ×3 IMPLANT
SPONGE LAP 18X18 X RAY DECT (DISPOSABLE) ×3 IMPLANT
STRIP CLOSURE SKIN 1/2X4 (GAUZE/BANDAGES/DRESSINGS) ×2 IMPLANT
SUCTION FRAZIER TIP 10 FR DISP (SUCTIONS) ×3 IMPLANT
SUT ETHILON 3 0 PS 1 (SUTURE) ×9 IMPLANT
SUT MNCRL AB 4-0 PS2 18 (SUTURE) ×6 IMPLANT
SUT MON AB 2-0 CT1 27 (SUTURE) ×3 IMPLANT
SUT VIC AB 0 CT1 27 (SUTURE)
SUT VIC AB 0 CT1 27XBRD ANBCTR (SUTURE) IMPLANT
SYR BULB IRRIGATION 50ML (SYRINGE) ×3 IMPLANT
SYR CONTROL 10ML LL (SYRINGE) IMPLANT
TOWEL OR 17X24 6PK STRL BLUE (TOWEL DISPOSABLE) ×3 IMPLANT
TOWEL OR 17X26 10 PK STRL BLUE (TOWEL DISPOSABLE) ×3 IMPLANT
TUBE CONNECTING 12'X1/4 (SUCTIONS) ×1
TUBE CONNECTING 12X1/4 (SUCTIONS) ×2 IMPLANT
UNDERPAD 30X30 INCONTINENT (UNDERPADS AND DIAPERS) ×3 IMPLANT
WATER STERILE IRR 1000ML POUR (IV SOLUTION) ×3 IMPLANT
YANKAUER SUCT BULB TIP NO VENT (SUCTIONS) IMPLANT

## 2014-11-20 NOTE — Progress Notes (Signed)
She has some interval loss of reduction of her left ankle fracture. I have spoken with her and her daughter and we are going to push surgery up to today.  Plan is to reduce the ankle, and if skin is appropriate we will fix fractures +/- external fixator.     MURPHY, TIMOTHY D

## 2014-11-20 NOTE — H&P (View-Only) (Signed)
Subjective: 2 Days Post-Op Procedure(s) (LRB): CLOSED REDUCTION ANKLE (N/A) Patient reports pain as mild.    Objective: Vital signs in last 24 hours: Temp:  [97.7 F (36.5 C)-98.9 F (37.2 C)] 97.7 F (36.5 C) (06/26 0647) Pulse Rate:  [74-80] 74 (06/26 0647) Resp:  [16-17] 16 (06/26 0647) BP: (140-146)/(59-66) 143/64 mmHg (06/26 0647) SpO2:  [100 %] 100 % (06/26 0647)  Intake/Output from previous day: 06/25 0701 - 06/26 0700 In: 170 [P.O.:170] Out: 1300 [Urine:1300] Intake/Output this shift:     Recent Labs  11/17/14 1457 11/17/14 1910  HGB 10.5* 9.4*    Recent Labs  11/17/14 1457 11/17/14 1910  WBC  --  7.5  RBC  --  3.17*  HCT 31.0* 29.7*  PLT  --  213    Recent Labs  11/18/14 0433 11/19/14 0401  NA 134* 137  K 4.0 3.6  CL 104 106  CO2 21* 22  BUN 47* 52*  CREATININE 3.89* 4.27*  GLUCOSE 236* 146*  CALCIUM 8.5* 8.7*   No results for input(s): LABPT, INR in the last 72 hours.  Neurovascular intact Sensation intact distally Incision: dressing C/D/I wiggles toes  Assessment/Plan: 2 Days Post-Op Procedure(s) (LRB): CLOSED REDUCTION ANKLE (N/A) NWB LLE in splint, up with assistance Elevate leg to help reduce swelling Plan for OR mon or tues with Dr. Fredonia Highland for definitive fixation ORIF surg clearance per medicine team appreciate participation in care  [place] 11/19/2014, 9:08 AM

## 2014-11-20 NOTE — Care Management (Signed)
Important Message  Patient Details  Name: Robin [name] MRN: [id number] Date of Birth: 10/31/1950   Medicare Important Message Given:  Yes-second notification given    Delorse Lek 11/20/2014, 1:03 PM

## 2014-11-20 NOTE — Progress Notes (Signed)
PT Cancellation Note  Patient Details Name: Robin [name] MRN: [id number] DOB: 09/22/1950   Cancelled Treatment:    Reason Eval/Treat Not Completed: Patient not medically ready (Pt to go to OR today per MD's notes).  PT will continue to follow acutely.     Joslyn Hy PT, DPT [phone] Pager: [phone] 11/20/2014, 8:45 AM

## 2014-11-20 NOTE — Interval H&P Note (Signed)
History and Physical Interval Note:  11/20/2014 8:05 AM  Robin [name]  has presented today for surgery, with the diagnosis of Bren[place]  The various methods of treatment have been discussed with the patient and family. After consideration of risks, benefits and other options for treatment, the patient has consented to  Procedure(s): OPEN REDUCTION INTERNAL FIXATION (ORIF) ANKLE FRACTURE (Left) as a surgical intervention .  The patient's history has been reviewed, patient examined, no change in status, stable for surgery.  I have reviewed the patient's chart and labs.  Questions were answered to the patient's satisfaction.     Rhone Ozaki D

## 2014-11-20 NOTE — Op Note (Signed)
11/17/2014 - 11/20/2014  3:50 PM  PATIENT:  [name]    PRE-OPERATIVE DIAGNOSIS:  LEFT BIMALEOLAR ANKLE FRACTURE  POST-OPERATIVE DIAGNOSIS:  Same  PROCEDURE:  OPEN REDUCTION INTERNAL FIXATION (ORIF) ANKLE FRACTURE  SURGEON:  MURPHY, Ernesta Amble, MD  ASSISTANT: Lovett Calender, PA-C, She was present and scrubbed throughout the case, critical for completion in a timely fashion, and for retraction, instrumentation, and closure.   ANESTHESIA:   spinal  PREOPERATIVE INDICATIONS:  Robin [name] is a  64 y.o. female with a diagnosis of [place] who failed conservative measures and elected for surgical management.    The risks benefits and alternatives were discussed with the patient preoperatively including but not limited to the risks of infection, bleeding, nerve injury, cardiopulmonary complications, the need for revision surgery, among others, and the patient was willing to proceed.  OPERATIVE IMPLANTS: stainless steel [employer] hardware  OPERATIVE FINDINGS: Unstable ankle fracture. Stable syndesmosis post op  BLOOD LOSS: min  COMPLICATIONS: none  TOURNIQUET TIME: 1:30  OPERATIVE PROCEDURE:  Patient was identified in the preoperative holding area and site was marked by me He was transported to the operating theater and placed on the table in supine position taking care to pad all bony prominences. After a preincinduction time out anesthesia was induced. The left lower extremity was prepped and draped in normal sterile fashion and a pre-incision timeout was performed. [name] received ancef for preoperative antibiotics.   I made a lateral incision of roughly 7 cm dissection was carried down sharply to the distal fibula and then spreading dissection was used proximally to protect the superficial peroneal nerve. I sharply incised the periosteum and took care to protect the peroneal tendons. I then debrided the fracture site and performed a reduction maneuver which  was held in place with a clamp.   I placed a lag screw across the fracture  I then selected a 6-hole one third tubular plate and placed in a neutralization fashion care was taken distally so as not to penetrate the joint with the cancellus screws.  I then turned my attention medially where I created a 4 cm incision and dissected sharply down to the medial Mal fracture taking care to protect the saphenous vein. I debrided the fracture and reduced and held in place with a tenaculum. I then drilled and placed 2 partially threaded 45 mm cannulated screws one anterior and one posterior across the fracture.  I then stressed the syndesmosis and it was stable  Her posterior mal piece was significant and provided posterior stability so I reduced it with a joker and placed a canulated screw acreoss the fracture  The wound was then thoroughly irrigated and closed using a 0 Vicryl and absorbable Monocryl sutures. He was placed in a short leg splint.   POST OPERATIVE PLAN: Non-weightbearing. DVT prophylaxis will consist of ambulation and chemical px.   This note was generated using a template and dragon dictation system. In light of that, I have reviewed the note and all aspects of it are applicable to this case. Any dictation errors are due to the computerized dictation system.

## 2014-11-20 NOTE — Progress Notes (Signed)
PROGRESS NOTE  Robin [name] [id number] DOB: [birth year] DOA: 11/17/2014 PCP: No primary care provider on file.  brief history 64 year old female with a history of diabetes mellitus type 2, hypertension, coronary artery disease with stent, CKD stage IV, bipolar disorder presented from her orthopedist's office for close reduction of her left ankle fracture. Apparently, the patient followed by 3 days prior to admission , but was still bearing weight. The patient had another fall and felt a pop. She had delayed presentation to the orthopedic office where x-rays revealed a trimalleolar ankle fracture and dislocation. The patient was admitted directly for close reduction. Subsequent, the patient was taken for open reduction internal fixation on 11/20/2014 Assessment/Plan: Delirium - 6/26--improved - suspect residual effects from the patient's conscious sedation-->pt improved after 24 hrs -given pt's recent falls-->CT brain--> no acute findings - UA--negative for pyuria - Check TSH, serum B12, ammonia--all unremarkable - Minimize Hypnotic and psychotropic medications if possible -d/c gabapentin diabetes mellitus type 2 - apparently patient was taking Levemir 55 units twice a day at home - daughter questions the patient's compliance - check hemoglobin A1c--8.3 - Increase Levemir--10 units daily -NovoLog 3 units with each meal - NovoLog sliding scale Hypertension - continue amlodipine and carvedilol - blood pressure controlled - Decrease dose of hydralazine Acute on chronic CKD stage IV - Baseline creatinine 3.2-3.5 - hold Bumex - give fluid challenge--give 1L-->improving serum creatinine -AM BMP Chronic systolic and diastolic CHF - repeat echocardiogram - last echocardiogram 11/29/2009 shows EF [id number] percent, diastolic dysfunction - appears compensated at this time - continue Imdur and hydralazine -11/18/2014 echo shows EF 55-60 percent, no WMA, grade diastolic  dysfxn bipolar disorder - continue Seroquel , Cogentin, Prolixin Trimalleolar ankle fracture -internal fixation on 6/27 -PT/OT   Family Communication: Daughter updated at bedside 6/27 Disposition Plan: Home when cleared by ortho         Procedures/Studies: Dg Ankle Complete Left  12/06/2014   CLINICAL DATA:  Known left ankle fracture.  EXAM: LEFT ANKLE COMPLETE - 3+ VIEW  COMPARISON:  None.  FINDINGS: Exam detail is diminished secondary to casting material. There is a trimalleolar fracture and dislocation of the tibiotalar joint.  IMPRESSION: 1. Trimalleolar fracture and tibiotalar joint dislocation.   Electronically Signed   By: Kerby Moors M.D.   On: 2014-12-06 09:09   Ct Head Wo Contrast  11/18/2014   CLINICAL DATA:  Acute encephalopathy.  Delerium.   Multiple falls.  EXAM: CT HEAD WITHOUT CONTRAST  TECHNIQUE: Contiguous axial images were obtained from the base of the skull through the vertex without intravenous contrast.  COMPARISON:  Multiple exams, including 08/02/2009  FINDINGS: The brainstem, cerebellum, cerebral peduncles, thalamus, basal ganglia, basilar cisterns, and ventricular system appear within normal limits. No intracranial hemorrhage, mass lesion, or acute CVA.  There is atherosclerotic calcification of the cavernous carotid arteries bilaterally.  IMPRESSION: 1. No acute intracranial findings. 2. There is atherosclerotic calcification of the cavernous carotid arteries bilaterally.   Electronically Signed   By: Van Clines M.D.   On: 11/18/2014 15:34   Dg Chest Port 1 View  11/17/2014   CLINICAL DATA:  Admission today for left leg fracture. History of hypertension diabetes. No sleep apnea. History of smoking.  EXAM: PORTABLE CHEST - 1 VIEW  COMPARISON:  11/28/2009  FINDINGS: The heart is mildly enlarged. There are no focal consolidations or pleural effusions. No pulmonary edema. Overlying the left lateral sixth rib and left scapula there  is a density of  uncertain significance. This may represent confluence of normal structures but follow-up PA and lateral chest may be helpful in excluding a lung nodule.  IMPRESSION: 1. Cardiomegaly without edema. 2. Density overlying the left lung of uncertain significance. Because a lung nodule cannot be excluded a follow-up PA and lateral chest x-ray is recommended when the patient is able. Alternatively chest CT can be performed.   Electronically Signed   By: Nolon Nations M.D.   On: 11/17/2014 18:42   Dg Ankle Left Port  11/20/2014   CLINICAL DATA:  Subsequent encounter for left ankle fracture  EXAM: PORTABLE LEFT ANKLE - 2 VIEW  COMPARISON:  11/19/2014.  FINDINGS: Three-view left ankle has fine needle obscured by the overlying plaster. Two cannulated compression screws transfix the medial malleolar fracture. It third cannulated screw is seen in the distal tibia. The patient is status post lateral plate and screw fixation of the distal fibula. Bony alignment on the lateral film is markedly improved compared to the preoperative study from yesterday.  IMPRESSION: Status post ORIF for ankle fracture.   Electronically Signed   By: Misty Stanley M.D.   On: 11/20/2014 16:52         Subjective: Patient denies fevers, chills, headache, chest pain, dyspnea, nausea, vomiting, diarrhea, abdominal pain, dysuria, hematuria   Objective: Filed Vitals:   11/20/14 1730 11/20/14 1732 11/20/14 1736 11/20/14 1737  BP:  140/63    Pulse: 77 77 79   Temp:    98.3 F (36.8 C)  TempSrc:      Resp: 11 13 13    Height:      Weight:      SpO2: 96% 95% 96%     Intake/Output Summary (Last 24 hours) at 11/20/14 1840 Last data filed at 11/20/14 1622  Gross per 24 hour  Intake    200 ml  Output    820 ml  Net   -620 ml   Weight change:  Exam:   General:  Pt is alert, follows commands appropriately, not in acute distress  HEENT: No icterus, No thrush, No neck mass, [place]/AT  Cardiovascular: RRR, S1/S2, no rubs, no  gallops  Respiratory: CTA bilaterally, no wheezing, no crackles, no rhonchi  Abdomen: Soft/+BS, non tender, non distended, no guarding; no hepatosplenomegaly   Extremities: left lower extremity in a splint. Right lower extremity without any cyanosis or clubbing. No rashes. Trace edema.  Data Reviewed: Basic Metabolic Panel:  Recent Labs Lab 11/17/14 1457 11/17/14 1910 11/18/14 0433 11/19/14 0401 11/20/14 0605  NA 136 136 134* 137 136  K 3.3* 3.7 4.0 3.6 3.9  CL  --  105 104 106 106  CO2  --  21* 21* 22 23  GLUCOSE 367* 328* 236* 146* 226*  BUN  --  44* 47* 52* 48*  CREATININE  --  3.78* 3.89* 4.27* 3.73*  CALCIUM  --  8.6* 8.5* 8.7* 9.0  MG  --  2.0  --   --   --   PHOS  --  3.9  --   --   --    Liver Function Tests:  Recent Labs Lab 11/18/14 0433 11/19/14 0401  AST 17 15  ALT 13* 13*  ALKPHOS 71 65  BILITOT 0.5 0.5  PROT 6.0* 6.4*  ALBUMIN 2.6* 2.5*   No results for input(s): LIPASE, AMYLASE in the last 168 hours.  Recent Labs Lab 11/18/14 1505  AMMONIA 18   CBC:  Recent Labs Lab 11/17/14 1457  11/17/14 1910  WBC  --  7.5  HGB 10.5* 9.4*  HCT 31.0* 29.7*  MCV  --  93.7  PLT  --  213   Cardiac Enzymes: No results for input(s): CKTOTAL, CKMB, CKMBINDEX, TROPONINI in the last 168 hours. BNP: Invalid input(s): POCBNP CBG:  Recent Labs Lab 11/19/14 1620 11/19/14 2146 11/20/14 0639 11/20/14 1301 11/20/14 1617  GLUCAP 217* 257* 213* 256* 190*    Recent Results (from the past 240 hour(s))  Surgical pcr screen     Status: None   Collection Time: 11/17/14  3:40 PM  Result Value Ref Range Status   MRSA, PCR NEGATIVE NEGATIVE Final   Staphylococcus aureus NEGATIVE NEGATIVE Final    Comment:        The Xpert SA Assay (FDA approved for NASAL specimens in patients over 1 years of age), is one component of a comprehensive surveillance program.  Test performance has been validated by [facility] for patients greater than or equal to 68 year  old. It is not intended to diagnose infection nor to guide or monitor treatment.   Urine culture     Status: None   Collection Time: 11/18/14  7:00 PM  Result Value Ref Range Status   Specimen Description URINE, CATHETERIZED  Final   Special Requests NONE  Final   Culture NO GROWTH 2 DAYS  Final   Report Status 11/20/2014 FINAL  Final     Scheduled Meds: . amLODipine  10 mg Oral Daily  . benztropine  0.5 mg Oral BID  . carvedilol  25 mg Oral BID WC  .  ceFAZolin (ANCEF) IV  2 g Intravenous Q6H  . docusate sodium  100 mg Oral BID  . ferrous gluconate  324 mg Oral Q breakfast  . fluPHENAZine  1 mg Oral Daily  . heparin  5,000 Units Subcutaneous 3 times per day  . hydrALAZINE  25 mg Oral Q12H  . insulin aspart  0-15 Units Subcutaneous TID WC  . insulin aspart  0-5 Units Subcutaneous QHS  . insulin detemir  5 Units Subcutaneous QHS  . isosorbide mononitrate  60 mg Oral Daily  . multivitamin with minerals  1 tablet Oral Daily  . pantoprazole  20 mg Oral Daily  . QUEtiapine  600 mg Oral QHS  . senna  1 tablet Oral BID   Continuous Infusions: . sodium chloride 10 mL/hr at 11/20/14 1337     Desmund Elman, DO  [facility] Pager [phone]  If 7PM-7AM, please contact night-coverage [email or web address] Password [facility] 11/20/2014, 6:40 PM   LOS: 3 days

## 2014-11-20 NOTE — Anesthesia Preprocedure Evaluation (Signed)
Anesthesia Evaluation  Patient identified by MRN, date of birth, ID band Patient awake    Reviewed: Allergy & Precautions, NPO status , Patient's Chart, lab work & pertinent test results  Airway Mallampati: II  TM Distance: <3 FB Neck ROM: Full    Dental   Pulmonary Current Smoker,  breath sounds clear to auscultation        Cardiovascular hypertension, + CAD and +CHF Rhythm:Regular Rate:Normal     Neuro/Psych    GI/Hepatic   Endo/Other  diabetes, Poorly Controlled  Renal/GU Renal Insufficiency and CRFRenal disease     Musculoskeletal   Abdominal (+) + obese,   Peds  Hematology   Anesthesia Other Findings   Reproductive/Obstetrics                             Anesthesia Physical Anesthesia Plan  ASA: III  Anesthesia Plan: Spinal   Post-op Pain Management:    Induction: Intravenous  Airway Management Planned:   Additional Equipment:   Intra-op Plan:   Post-operative Plan: Extubation in OR  Informed Consent: I have reviewed the patients History and Physical, chart, labs and discussed the procedure including the risks, benefits and alternatives for the proposed anesthesia with the patient or authorized representative who has indicated his/her understanding and acceptance.   Dental advisory given  Plan Discussed with: CRNA and Surgeon  Anesthesia Plan Comments:         Anesthesia Quick Evaluation

## 2014-11-20 NOTE — Progress Notes (Signed)
Results for DOXIE, PETRINI (MRN [id number]) as of 11/20/2014 10:54  Ref. Range 11/19/2014 06:52 11/19/2014 12:02 11/19/2014 16:20 11/19/2014 21:46 11/20/2014 06:39  Glucose-Capillary Latest Ref Range: 65-99 mg/dL 114 (H) 206 (H) 217 (H) 257 (H) 213 (H)  Noted that CBGs greater than 180 mg/dl.  NPO for surgery today. Recommend changing Novolog MODERATE correction scale to every 4 hours while NPO.  Will continue to follow while in hospital. Harvel Ricks RN BSN CDE

## 2014-11-20 NOTE — Progress Notes (Signed)
Received prescreen request for inpatient rehab from PT and have reviewed pt's case. PT evaluation recommended SNF and I have noted that pt is having ankle surgery (with possible ext fixator placement) later today. At this point, we would like to see how pt progresses with therapy after today's surgery. I will follow her case and make recommendation at a later point.  Thanks.  Nanetta Batty, PT Rehabilitation Admissions Coordinator [phone]

## 2014-11-20 NOTE — Transfer of Care (Signed)
Immediate Anesthesia Transfer of Care Note  Patient: Robin [name]  Procedure(s) Performed: Procedure(s): OPEN REDUCTION INTERNAL FIXATION (ORIF) ANKLE FRACTURE (Left)  Patient Location: PACU  Anesthesia Type:Spinal  Level of Consciousness: awake, alert  and oriented  Airway & Oxygen Therapy: Patient Spontanous Breathing  Post-op Assessment: Report given to RN and Post -op Vital signs reviewed and stable  Post vital signs: Reviewed and stable  Last Vitals:  Filed Vitals:   11/20/14 1617  BP: 146/66  Pulse: 73  Temp:   Resp: 11    Complications: No apparent anesthesia complications

## 2014-11-20 NOTE — Anesthesia Postprocedure Evaluation (Signed)
Anesthesia Post Note  Patient: Robin [name]  Procedure(s) Performed: Procedure(s) (LRB): OPEN REDUCTION INTERNAL FIXATION (ORIF) ANKLE FRACTURE (Left)  Anesthesia type: general  Patient location: PACU  Post pain: Pain level controlled  Post assessment: Patient's Cardiovascular Status Stable  Last Vitals:  Filed Vitals:   11/20/14 1737  BP:   Pulse:   Temp: 36.8 C  Resp:     Post vital signs: Reviewed and stable  Level of consciousness: sedated  Complications: No apparent anesthesia complications

## 2014-11-20 NOTE — Anesthesia Procedure Notes (Addendum)
Spinal Patient location during procedure: OR Start time: 11/20/2014 1:55 PM End time: 11/20/2014 2:02 PM Staffing Anesthesiologist: MASSAGEE, TERRY Performed by: anesthesiologist  Preanesthetic Checklist Completed: patient identified, site marked, surgical consent, pre-op evaluation, timeout performed, IV checked, risks and benefits discussed and monitors and equipment checked Spinal Block Patient position: left lateral decubitus Prep: ChloraPrep Patient monitoring: heart rate, cardiac monitor, continuous pulse ox and blood pressure Approach: left paramedian Location: L3-4 Injection technique: single-shot Needle Needle type: Quincke  Needle gauge: 25 G Needle length: 9 cm Needle insertion depth: 6 cm Assessment Sensory level: T6 Additional Notes Tolerated well  Procedure Name: MAC Date/Time: 11/20/2014 2:00 PM Performed by: Rogers Blocker Pre-anesthesia Checklist: Patient identified, Timeout performed, Emergency Drugs available, Suction available and Patient being monitored Patient Re-evaluated:Patient Re-evaluated prior to inductionOxygen Delivery Method: Nasal cannula Placement Confirmation: positive ETCO2

## 2014-11-21 ENCOUNTER — Encounter (HOSPITAL_COMMUNITY): Payer: Self-pay | Admitting: Orthopedic Surgery

## 2014-11-21 LAB — GLUCOSE, CAPILLARY
Glucose-Capillary: 160 mg/dL — ABNORMAL HIGH (ref 65–99)
Glucose-Capillary: 108 mg/dL — ABNORMAL HIGH (ref 65–99)
Glucose-Capillary: 148 mg/dL — ABNORMAL HIGH (ref 65–99)
Glucose-Capillary: 140 mg/dL — ABNORMAL HIGH (ref 65–99)

## 2014-11-21 LAB — BASIC METABOLIC PANEL
Anion gap: 13 (ref 5–15)
BUN: 41 mg/dL — ABNORMAL HIGH (ref 6–20)
CALCIUM: 8.6 mg/dL — AB (ref 8.9–10.3)
CO2: 16 mmol/L — ABNORMAL LOW (ref 22–32)
CREATININE: 3.41 mg/dL — AB (ref 0.44–1.00)
Chloride: 109 mmol/L (ref 101–111)
GFR calc non Af Amer: 13 mL/min — ABNORMAL LOW (ref >60)
GFR, EST AFRICAN AMERICAN: 16 mL/min — AB (ref >60)
Glucose, Bld: 145 mg/dL — ABNORMAL HIGH (ref 65–99)
Potassium: 4 mmol/L (ref 3.5–5.1)
Sodium: 138 mmol/L (ref 135–145)

## 2014-11-21 LAB — URINE MICROSCOPIC-ADD ON

## 2014-11-21 LAB — URINALYSIS, ROUTINE W REFLEX MICROSCOPIC
BILIRUBIN URINE: NEGATIVE
GLUCOSE, UA: NEGATIVE mg/dL
Hgb urine dipstick: NEGATIVE
Ketones, ur: NEGATIVE mg/dL
NITRITE: NEGATIVE
PH: 5 (ref 5.0–8.0)
Protein, ur: 100 mg/dL — AB
SPECIFIC GRAVITY, URINE: 1.013 (ref 1.005–1.030)
Urobilinogen, UA: 0.2 mg/dL (ref 0.0–1.0)

## 2014-11-21 LAB — HEPATITIS C ANTIBODY (REFLEX): HCV Ab: <0.1 s/co ratio (ref 0.0–0.9)

## 2014-11-21 LAB — HCV COMMENT:

## 2014-11-21 LAB — HEPATITIS B SURFACE ANTIGEN: Hepatitis B Surface Ag: NEGATIVE

## 2014-11-21 NOTE — Progress Notes (Signed)
Physical Therapy Treatment Patient Details Name: NEKETA LACE MRN: [id number] DOB: 08-May-1951 Today's Date: 11/21/2014    History of Present Illness 64 yo female post fall with delayed ankle fracture ORIF.      PT Comments    Patient is very lethargic. She will open her eyes but will not respond and cannot follow commands for me during this session. RN made aware. Unable to transfer this session due to lethargy and safety. Patient soiled upon entering room so increased time to provide rolling and pericare. RN stated that patient had meds earlier this AM that have led to her lethargy. Will have PT follow up in AM if appropriate.   Follow Up Recommendations  SNF     Equipment Recommendations  None recommended by PT    Recommendations for Other Services       Precautions / Restrictions Precautions Precautions: Fall Restrictions LLE Weight Bearing: Non weight bearing    Mobility  Bed Mobility Overal bed mobility: Needs Assistance;+2 for physical assistance       Supine to sit: +2 for physical assistance;Max assist Sit to supine: +2 for physical assistance;Max assist   General bed mobility comments: A with all aspects due to letargic. Attemped to increased arousal by sitting EOB but patient continued to be non verabal and moaned only once. Unable to follow command. RN made aware. Daughter present.   Transfers                 General transfer comment: Unable to attempt.   Ambulation/Gait                 Stairs            Wheelchair Mobility    Modified Rankin (Stroke Patients Only)       Balance                                    Cognition Arousal/Alertness: Lethargic Behavior During Therapy: Flat affect Overall Cognitive Status: Impaired/Different from baseline Area of Impairment: Following commands               General Comments: patient nonverbal and unable to follow any commands. Attemped to sit EOB to increase  arousal but unsucessful.     Exercises      General Comments        Pertinent Vitals/Pain Pain Assessment: No/denies pain    Home Living                      Prior Function            PT Goals (current goals can now be found in the care plan section) Progress towards PT goals: Not progressing toward goals - comment    Frequency  Min 3X/week    PT Plan Current plan remains appropriate    Co-evaluation             End of Session Equipment Utilized During Treatment: Gait belt Activity Tolerance: Patient limited by lethargy Patient left: in bed;with call bell/phone within reach     Time: 1040-1109 PT Time Calculation (min) (ACUTE ONLY): 29 min  Charges:  $Therapeutic Activity: 23-37 mins                    G Codes:      Jacqualyn Posey 11/21/2014, 11:29 AM 11/21/2014 Jacqualyn Posey PTA [phone] pager [phone]  office

## 2014-11-21 NOTE — Progress Notes (Signed)
PROGRESS NOTE  AHNYA SATURDAY [id number] DOB: Mar 27, 1951 DOA: 11/17/2014 PCP: No primary care provider on file.  brief history 64 year old female with a history of diabetes mellitus type 2, hypertension, coronary artery disease with stent, CKD stage IV, bipolar disorder presented from her orthopedist's office for close reduction of her left ankle fracture. Apparently, the patient followed by 3 days prior to admission , but was still bearing weight. The patient had another fall and felt a pop. She had delayed presentation to the orthopedic office where x-rays revealed a trimalleolar ankle fracture and dislocation. The patient was admitted directly for close reduction. Subsequent, the patient was taken for open reduction internal fixation on 11/20/2014 Assessment/Plan: Delirium - due to opioids - d/c opioids; d/c seroquel for now -tylenol only for pain for now - more alert when I d/ced opioids and rechecked pt in afternoon -given pt's recent falls-->CT brain--> no acute findings - UA--negative for pyuria - Check TSH, serum B12, ammonia--all unremarkable - Minimize Hypnotic and psychotropic medications if possible -d/c gabapentin diabetes mellitus type 2 - apparently patient was taking Levemir 55 units twice a day at home - daughter questions the patient's compliance - check hemoglobin A1c--8.3 - Increase Levemir--10 units daily -NovoLog 3 units with each meal - NovoLog sliding scale Hypertension - continue amlodipine and carvedilol - blood pressure controlled - Decrease dose of hydralazine Acute on chronic CKD stage IV - Baseline creatinine 3.2-3.5 - hold Bumex - give fluid challenge--give 1L-->improving serum creatinine -serum creatinine now back to baseline--3.41 on [id number] Chronic systolic and diastolic CHF - repeat echocardiogram - last echocardiogram 11/29/2009 shows EF [id number] percent, diastolic dysfunction - appears compensated at this time - continue Imdur  and hydralazine -11/18/2014 echo shows EF 55-60 percent, no WMA, grade diastolic dysfxn bipolar disorder - continue Seroquel , Cogentin, Prolixin Trimalleolar ankle fracture -internal fixation on 6/27 -PT/OT-->SNF   Family Communication: Daughter updated at bedside 6/28 Disposition Plan: SNF 6/29 if cleared by ortho    Procedures/Studies: Dg Ankle Complete Left  Nov 20, 2014   CLINICAL DATA:  Known left ankle fracture.  EXAM: LEFT ANKLE COMPLETE - 3+ VIEW  COMPARISON:  None.  FINDINGS: Exam detail is diminished secondary to casting material. There is a trimalleolar fracture and dislocation of the tibiotalar joint.  IMPRESSION: 1. Trimalleolar fracture and tibiotalar joint dislocation.   Electronically Signed   By: Kerby Moors M.D.   On: Nov 20, 2014 09:09   Ct Head Wo Contrast  11/18/2014   CLINICAL DATA:  Acute encephalopathy.  Delerium.   Multiple falls.  EXAM: CT HEAD WITHOUT CONTRAST  TECHNIQUE: Contiguous axial images were obtained from the base of the skull through the vertex without intravenous contrast.  COMPARISON:  Multiple exams, including 08/02/2009  FINDINGS: The brainstem, cerebellum, cerebral peduncles, thalamus, basal ganglia, basilar cisterns, and ventricular system appear within normal limits. No intracranial hemorrhage, mass lesion, or acute CVA.  There is atherosclerotic calcification of the cavernous carotid arteries bilaterally.  IMPRESSION: 1. No acute intracranial findings. 2. There is atherosclerotic calcification of the cavernous carotid arteries bilaterally.   Electronically Signed   By: Van Clines M.D.   On: 11/18/2014 15:34   Dg Chest Port 1 View  11/17/2014   CLINICAL DATA:  Admission today for left leg fracture. History of hypertension diabetes. No sleep apnea. History of smoking.  EXAM: PORTABLE CHEST - 1 VIEW  COMPARISON:  11/28/2009  FINDINGS: The heart is mildly enlarged. There are no focal consolidations  or pleural effusions. No pulmonary edema.  Overlying the left lateral sixth rib and left scapula there is a density of uncertain significance. This may represent confluence of normal structures but follow-up PA and lateral chest may be helpful in excluding a lung nodule.  IMPRESSION: 1. Cardiomegaly without edema. 2. Density overlying the left lung of uncertain significance. Because a lung nodule cannot be excluded a follow-up PA and lateral chest x-ray is recommended when the patient is able. Alternatively chest CT can be performed.   Electronically Signed   By: Nolon Nations M.D.   On: 11/17/2014 18:42   Dg Ankle Left Port  11/20/2014   CLINICAL DATA:  Subsequent encounter for left ankle fracture  EXAM: PORTABLE LEFT ANKLE - 2 VIEW  COMPARISON:  11/19/2014.  FINDINGS: Three-view left ankle has fine needle obscured by the overlying plaster. Two cannulated compression screws transfix the medial malleolar fracture. It third cannulated screw is seen in the distal tibia. The patient is status post lateral plate and screw fixation of the distal fibula. Bony alignment on the lateral film is markedly improved compared to the preoperative study from yesterday.  IMPRESSION: Status post ORIF for ankle fracture.   Electronically Signed   By: Misty Stanley M.D.   On: 11/20/2014 16:52         Subjective: Patient is awake and alert. Denies any pain or shortness of breath. Denies any vomiting, diarrhea, abdominal pain.  Objective: Filed Vitals:   11/21/14 0008 11/21/14 0416 11/21/14 1143 11/21/14 1326  BP: 170/64 155/67 156/65 149/64  Pulse: 87 84 78 76  Temp: 100.5 F (38.1 C) 99.2 F (37.3 C) 100 F (37.8 C) 98.7 F (37.1 C)  TempSrc:  Oral Axillary   Resp: 17 18 16 16   Height:      Weight:      SpO2: 97% 94% 97% 97%    Intake/Output Summary (Last 24 hours) at 11/21/14 1929 Last data filed at 11/21/14 1700  Gross per 24 hour  Intake    250 ml  Output      0 ml  Net    250 ml   Weight change:  Exam:   General:  Pt is alert,  follows commands appropriately, not in acute distress  HEENT: No icterus, No thrush, No meningismus, [place]/AT  Cardiovascular: RRR, S1/S2, no rubs, no gallops  Respiratory: CTA bilaterally, no wheezing, no crackles, no rhonchi  Abdomen: Soft/+BS, non tender, non distended, no guarding Extremities: Left ankle in splint/brace Data Reviewed: Basic Metabolic Panel:  Recent Labs Lab 11/17/14 1910 11/18/14 0433 11/19/14 0401 11/20/14 0605 11/21/14 0745  NA 136 134* 137 136 138  K 3.7 4.0 3.6 3.9 4.0  CL 105 104 106 106 109  CO2 21* 21* 22 23 16*  GLUCOSE 328* 236* 146* 226* 145*  BUN 44* 47* 52* 48* 41*  CREATININE 3.78* 3.89* 4.27* 3.73* 3.41*  CALCIUM 8.6* 8.5* 8.7* 9.0 8.6*  MG 2.0  --   --   --   --   PHOS 3.9  --   --   --   --    Liver Function Tests:  Recent Labs Lab 11/18/14 0433 11/19/14 0401  AST 17 15  ALT 13* 13*  ALKPHOS 71 65  BILITOT 0.5 0.5  PROT 6.0* 6.4*  ALBUMIN 2.6* 2.5*   No results for input(s): LIPASE, AMYLASE in the last 168 hours.  Recent Labs Lab 11/18/14 1505  AMMONIA 18   CBC:  Recent Labs Lab 11/17/14 1457  11/17/14 1910  WBC  --  7.5  HGB 10.5* 9.4*  HCT 31.0* 29.7*  MCV  --  93.7  PLT  --  213   Cardiac Enzymes: No results for input(s): CKTOTAL, CKMB, CKMBINDEX, TROPONINI in the last 168 hours. BNP: Invalid input(s): POCBNP CBG:  Recent Labs Lab 11/20/14 1617 11/20/14 2106 11/21/14 0654 11/21/14 1139 11/21/14 1644  GLUCAP 190* 182* 160* 108* 148*    Recent Results (from the past 240 hour(s))  Surgical pcr screen     Status: None   Collection Time: 11/17/14  3:40 PM  Result Value Ref Range Status   MRSA, PCR NEGATIVE NEGATIVE Final   Staphylococcus aureus NEGATIVE NEGATIVE Final    Comment:        The Xpert SA Assay (FDA approved for NASAL specimens in patients over 59 years of age), is one component of a comprehensive surveillance program.  Test performance has been validated by [facility] for patients  greater than or equal to 56 year old. It is not intended to diagnose infection nor to guide or monitor treatment.   Urine culture     Status: None   Collection Time: 11/18/14  7:00 PM  Result Value Ref Range Status   Specimen Description URINE, CATHETERIZED  Final   Special Requests NONE  Final   Culture NO GROWTH 2 DAYS  Final   Report Status 11/20/2014 FINAL  Final     Scheduled Meds: . amLODipine  10 mg Oral Daily  . benztropine  0.5 mg Oral BID  . carvedilol  25 mg Oral BID WC  . docusate sodium  100 mg Oral BID  . ferrous gluconate  324 mg Oral Q breakfast  . fluPHENAZine  1 mg Oral Daily  . heparin  5,000 Units Subcutaneous 3 times per day  . hydrALAZINE  25 mg Oral Q12H  . insulin aspart  0-15 Units Subcutaneous TID WC  . insulin aspart  0-5 Units Subcutaneous QHS  . insulin aspart  3 Units Subcutaneous TID WC  . insulin detemir  10 Units Subcutaneous QHS  . isosorbide mononitrate  60 mg Oral Daily  . multivitamin with minerals  1 tablet Oral Daily  . pantoprazole  20 mg Oral Daily  . senna  1 tablet Oral BID   Continuous Infusions: . sodium chloride 10 mL/hr at 11/21/14 0700     Bayani Renteria, DO  [facility] Pager [phone]  If 7PM-7AM, please contact night-coverage [email or web address] Password [email or web address] 11/21/2014, 7:29 PM   LOS: 4 days

## 2014-11-21 NOTE — Clinical Social Work Note (Signed)
Clinical Social Work Assessment  Patient Details  Name: Robin [name] MRN: [id number] Date of Birth: 1950-11-19  Date of referral:  11/21/14               Reason for consult:  Facility Placement                Permission sought to share information with:   (patient alert though not oriented- daughter Robin [name] ) Permission granted to share information::     Name::        Agency::   [facility] SNFs)  Relationship::     Contact Information:     Housing/Transportation Living arrangements for the past 2 months:  Single Family Home Source of Information:  Adult Children (daughterMusician) Patient Interpreter Needed:  None Criminal Activity/Legal Involvement Pertinent to Current Situation/Hospitalization:  No - Comment as needed Significant Relationships:  Adult Children Lives with:    Do you feel safe going back to the place where you live?  No (fall risk) Need for family participation in patient care:     Care giving concerns:  Patient unable to care for self in current condition.  Patient daughter, Robin [name] states that prior to admission, patient was declining.  Patient not as talkative or active approximately 1 week prior to her fall/ankle fracture.  Patient is lying in bed with eyes open, but snoring- sleeping with eyes opened.  Daughter at bedside assisting with disposition and other financial matters for the patient.     Social Worker assessment / plan:  CSW assessed patient at bedside.  During such assessment, patient was evaluated by MD and vitals were taking by CNA.  CSW discussed disposition with daughter at bedside, Robin [name].  Robin [name] states that the patient is "typically like this until she fully wakes up".  Patient was not verbally responsive to MD while completing evaluation.  Patient was non-verbal towards CSW.  Assessment completed with daughter.  Daughter lives in the [place] area of [place].  CSW reviewed bed choices for the desired areas.  Daughter very interested in  [facility] due to close proximity.  CSW sent referrals to SNFs within [facility] and will anticipate discharge tomorrow per MD.  Employment status:  Retired Forensic scientist:  Medicare PT Recommendations:  [place], [place] / Referral to community resources:  [place]  Patient/Family's Response to care:  Patient daughter is Patent attorney of CSW assistance and involvement as she states the process has been very overwhelming.  Patient/Family's Understanding of and Emotional Response to Diagnosis, Current Treatment, and Prognosis:  Robin [name] is very realistic regarding patient's needed level of care and prognosis after discharge.    Emotional Assessment Appearance:  Appears stated age Attitude/Demeanor/Rapport:   (non-verbal/responsive though alert) Affect (typically observed):  Blunt, Stable, Stoic Orientation:  Oriented to Self Alcohol / Substance use:  Not Applicable Psych involvement (Current and /or in the community):   (patient has bipolar medications managed by [name])  Discharge Needs  Concerns to be addressed:  No discharge needs identified Readmission within the last 30 days:    Current discharge risk:  None Barriers to Discharge:  No Barriers Identified   Dulcy Fanny, LCSW 11/21/2014, 12:25 PM

## 2014-11-21 NOTE — Clinical Social Work Placement (Signed)
   CLINICAL SOCIAL WORK PLACEMENT  NOTE  Date:  11/21/2014  Patient Details  Name: Robin [name] MRN: [id number] Date of Birth: 03/24/1951  Clinical Social Work is seeking post-discharge placement for this patient at the [place] level of care (*CSW will initial, date and re-position this form in  chart as items are completed):  Yes   Patient/family provided with [facility] Work Department's list of facilities offering this level of care within the geographic area requested by the patient (or if unable, by the patient's family).  Yes   Patient/family informed of their freedom to choose among providers that offer the needed level of care, that participate in Medicare, Medicaid or managed care program needed by the patient, have an available bed and are willing to accept the patient.  Yes   Patient/family informed of [facility]'s ownership interest in [facility] and [facility], as well as of the fact that they are under no obligation to receive care at these facilities.  PASRR submitted to EDS on 11/21/14     PASRR number received on 11/21/14     Existing PASRR number confirmed on       FL2 transmitted to all facilities in geographic area requested by pt/family on 11/21/14     FL2 transmitted to all facilities within larger geographic area on       Patient informed that his/her managed care company has contracts with or will negotiate with certain facilities, including the following:            Patient/family informed of bed offers received.  Patient chooses bed at       Physician recommends and patient chooses bed at      Patient to be transferred to   on 11/21/14.  Patient to be transferred to facility by       Patient family notified on 11/22/14 of transfer.  Name of family member notified:  Evette     PHYSICIAN Please prepare priority discharge summary, including medications     Additional Comment:     _______________________________________________ Dulcy Fanny, LCSW 11/21/2014, 12:32 PM

## 2014-11-21 NOTE — Progress Notes (Signed)
     Subjective:  POD#1 ORIF L bimaleolar ankle fracture. Patient reports pain as mild to moderate.  Resting comfortably in bed.  Very lethargic this morning and not able to follow commands.  The daughter states that this is how she is every morning until she fully wakes up around 10-11am.  Nursing also reports that this is her baseline in the morning.  The patient has sleep apnea but will not bear the mask at night.   Objective:   VITALS:   Filed Vitals:   11/20/14 1840 11/20/14 2103 11/21/14 0008 11/21/14 0416  BP: 156/61 152/67 170/64 155/67  Pulse: 82 86 87 84  Temp: 98.7 F (37.1 C) 99.3 F (37.4 C) 100.5 F (38.1 C) 99.2 F (37.3 C)  TempSrc:  Oral  Oral  Resp: 16 16 17 18   Height:      Weight:      SpO2: 94% 93% 97% 94%    ABD soft Neurovascular intact Sensation intact distally Intact pulses distally Incision: dressing C/D/I L ankle splinted  Lab Results  Component Value Date   WBC 7.5 11/17/2014   HGB 9.4* 11/17/2014   HCT 29.7* 11/17/2014   MCV 93.7 11/17/2014   PLT 213 11/17/2014   BMET    Component Value Date/Time   NA 136 11/20/2014 0605   K 3.9 11/20/2014 0605   CL 106 11/20/2014 0605   CO2 23 11/20/2014 0605   GLUCOSE 226* 11/20/2014 0605   BUN 48* 11/20/2014 0605   CREATININE 3.73* 11/20/2014 0605   CALCIUM 9.0 11/20/2014 0605   GFRNONAA 12* 11/20/2014 0605   GFRAA 14* 11/20/2014 0605     Assessment/Plan: 1 Day Post-Op   Principal Problem:   Fracture dislocation of ankle Active Problems:   DM type 2, uncontrolled, with renal complications   Hypokalemia   Essential hypertension, benign   Obesity   CAD (coronary artery disease)   Acute delirium   Type 2 diabetes mellitus with hyperglycemia   Essential hypertension   Chronic combined systolic and diastolic CHF (congestive heart failure)   Acute encephalopathy   Up with therapy NWB LLE ASA 325mg  daily for 3 weeks for DVT prophylaxis Patient has been up and moving to the bathroom  with the daughter yesterday.  We will see how she does with PT to better determine discharge planning. The daughter [facility] [place],  so she is requesting to go through them to set up rehab if needed.  I will discuss the case with the case managers to see what the patient's options are.    Robin [name] Lelan Pons 11/21/2014, 9:31 AM Cell [phone]

## 2014-11-21 NOTE — Progress Notes (Signed)
Inpatient Diabetes Program Recommendations  AACE/ADA: New Consensus Statement on Inpatient Glycemic Control (2013)  Target Ranges:  Prepandial:   less than 140 mg/dL      Peak postprandial:   less than 180 mg/dL (1-2 hours)      Critically ill patients:  140 - 180 mg/dL   Results for CHENAE, GOODLING (MRN [id number]) as of 11/21/2014 09:01  Ref. Range 11/20/2014 06:39 11/20/2014 13:01 11/20/2014 16:17 11/20/2014 21:06 11/21/2014 06:54  Glucose-Capillary Latest Ref Range: 65-99 mg/dL 213 (H) 256 (H) 190 (H) 182 (H) 160 (H)   Reason for assessment: elevated blood sugars  Please consider increasing Levemir insulin to 15 units qhs- fasting blood sugars 160mg /dl with 10 units Levemir.  Gentry Fitz, RN, BA, MHA, CDE Diabetes Coordinator Inpatient Diabetes Program  [phone] (Team Pager) [phone] ([place]) 11/21/2014 9:03 AM

## 2014-11-22 DIAGNOSIS — S82892D Other fracture of left lower leg, subsequent encounter for closed fracture with routine healing: Secondary | ICD-10-CM

## 2014-11-22 LAB — BASIC METABOLIC PANEL
ANION GAP: 13 (ref 5–15)
BUN: 41 mg/dL — AB (ref 6–20)
CHLORIDE: 106 mmol/L (ref 101–111)
CO2: 20 mmol/L — ABNORMAL LOW (ref 22–32)
Calcium: 9 mg/dL (ref 8.9–10.3)
Creatinine, Ser: 3.59 mg/dL — ABNORMAL HIGH (ref 0.44–1.00)
GFR, EST AFRICAN AMERICAN: 15 mL/min — AB (ref >60)
GFR, EST NON AFRICAN AMERICAN: 13 mL/min — AB (ref >60)
Glucose, Bld: 112 mg/dL — ABNORMAL HIGH (ref 65–99)
POTASSIUM: 3.9 mmol/L (ref 3.5–5.1)
SODIUM: 139 mmol/L (ref 135–145)

## 2014-11-22 LAB — URINE CULTURE: Culture: NO GROWTH

## 2014-11-22 LAB — GLUCOSE, CAPILLARY: Glucose-Capillary: 105 mg/dL — ABNORMAL HIGH (ref 65–99)

## 2014-11-22 MED ORDER — TRAMADOL HCL 50 MG PO TABS: 50.0000 mg | ORAL_TABLET | Freq: Four times a day (QID) | ORAL | Status: DC | PRN

## 2014-11-22 MED ORDER — QUETIAPINE FUMARATE 100 MG PO TABS: 200.0000 mg | ORAL_TABLET | Freq: Every day | ORAL | Status: DC

## 2014-11-22 MED ORDER — INSULIN DETEMIR 100 UNIT/ML ~~LOC~~ SOLN: 12.0000 [IU] | Freq: Every day | SUBCUTANEOUS | Status: DC

## 2014-11-22 NOTE — Progress Notes (Signed)
     Subjective:  POD#2 ORIF L bimaleolar ankle fx. Patient reports pain as mild to moderate.  Resting comfortably in bed.  Much more alert today.  Able to answer questions and follow commands.  Medicine thinks the delirium was due to the pain medication.  Recommending tylenol only at this time for pain control.    Objective:   VITALS:   Filed Vitals:   11/21/14 1326 11/21/14 2153 11/22/14 0428 11/22/14 0500  BP: 149/64 145/63 149/60   Pulse: 76 81 80   Temp: 98.7 F (37.1 C) 98.8 F (37.1 C) 99.5 F (37.5 C)   TempSrc:  Oral    Resp: 16 16 15    Height:      Weight:    87.454 kg (192 lb 12.8 oz)  SpO2: 97% 98% 96%     Neurologically intact ABD soft Neurovascular intact Sensation intact distally Intact pulses distally Incision: dressing C/D/I L leg splinted  Lab Results  Component Value Date   WBC 7.5 11/17/2014   HGB 9.4* 11/17/2014   HCT 29.7* 11/17/2014   MCV 93.7 11/17/2014   PLT 213 11/17/2014   BMET    Component Value Date/Time   NA 139 11/22/2014 0603   K 3.9 11/22/2014 0603   CL 106 11/22/2014 0603   CO2 20* 11/22/2014 0603   GLUCOSE 112* 11/22/2014 0603   BUN 41* 11/22/2014 0603   CREATININE 3.59* 11/22/2014 0603   CALCIUM 9.0 11/22/2014 0603   GFRNONAA 13* 11/22/2014 0603   GFRAA 15* 11/22/2014 0603     Assessment/Plan: 2 Days Post-Op   Principal Problem:   Fracture dislocation of ankle Active Problems:   DM type 2, uncontrolled, with renal complications   Hypokalemia   Essential hypertension, benign   Obesity   CAD (coronary artery disease)   Acute delirium   Type 2 diabetes mellitus with hyperglycemia   Essential hypertension   Chronic combined systolic and diastolic CHF (congestive heart failure)   Acute encephalopathy   Up with therapy NWB in the LLE ASA 325mg  daily for DVT prophylaxis Will hold narcotic pain medication at this time, tylenol only per medicine.  I will change discharge medications to tramadol instead of  percocet.  OK from ortho standpoint to be discharged once cleared by medicine team and pain is well controlled.    Sole Lengacher Lelan Pons 11/22/2014, 7:51 AM Cell [phone]

## 2014-11-22 NOTE — Progress Notes (Signed)
Rehab admissions - I am following up on my earlier note from 11-20-14 and have reviewed pt's case. She is now s/p ankle surgery and current therapy recommendations are for SNF. In addition, Education officer, museum notes indicate pt/family planning on SNF.   I will now sign off pt's case. Thanks.  Nanetta Batty, PT Rehabilitation Admissions Coordinator [phone]

## 2014-11-22 NOTE — Discharge Planning (Addendum)
Patient will discharge today per MD order. Patient will discharge to [facility] SNF RN to call report prior to transportation to: [phone] Transportation: daughter - dc summary needs to be completed and order needs to be placed prior to dc Anticipated dc time: 2-3pm  CSW sent discharge summary to SNF for review.  Packet is complete.  RN, patient and family aware of discharge plans.  Nonnie Done, [place] [phone]  Psychiatric & Orthopedics (5N 1-16) Clinical Social Worker

## 2014-11-22 NOTE — Discharge Summary (Signed)
[facility]  Physician Discharge Summary   [name] MRN: [id number] DOB/AGE: [birth year] 64 y.o.  Admit date: 11/17/2014 Discharge date: 11/22/2014  PCP: At [facility]:  Principal Problem:   Fracture dislocation of ankle Active Problems:   DM type 2, uncontrolled, with renal complications   Hypokalemia   Essential hypertension, benign   Obesity   CAD (coronary artery disease)   Acute delirium   Type 2 diabetes mellitus with hyperglycemia   Essential hypertension   Chronic combined systolic and diastolic CHF (congestive heart failure)   Acute encephalopathy   RECOMMENDATIONS FOR OUTPATIENT FOLLOW UP: 1. Check CBGs 4 times daily. Adjust Levemir dose for rising blood glucose levels. 2. Please note that there is an unidentified medication that patient takes every day. This can be reinitiated once it is identified and if appropriate.    DISCHARGE CONDITION: fair  Diet recommendation: : Carb Modified  Filed Weights   11/17/14 1427 11/22/14 0500  Weight: 83.915 kg (185 lb) 87.454 kg (192 lb 12.8 oz)    INITIAL HISTORY: 64 year old African-American female with a past medical history of diabetes, hypertension, coronary artery disease, chronic kidney disease stage IV with a fistula in her left upper extremity, bipolar disorder, presented for open reduction and internal fixation of her left ankle fracture.  Consultations:  Orthopedics: Dr. Percell Miller  Procedures: OPEN REDUCTION INTERNAL FIXATION (ORIF) ANKLE FRACTURE  2-D echocardiogram Study Conclusions - Left ventricle: The cavity size was normal. Wall thickness wasincreased in a pattern of moderate LVH. Systolic function wasnormal. The estimated ejection fraction was in the range of 55%to 60%. Wall motion was normal; there were no regional wallmotion abnormalities. Features are consistent with a pseudonormalleft ventricular filling pattern, with concomitant  abnormalrelaxation and increased filling pressure (grade 2 diastolicdysfunction). - Aortic valve: Mildly calcified annulus. Trileaflet; mildlythickened leaflets. Valve area (VTI): 2.22 cm^2. Valve area(Vmax): 2.18 cm^2. - Mitral valve: Mildly calcified annulus. Mildly thickened leaflets - Left atrium: The atrium was mildly dilated.  HOSPITAL COURSE:   Trimalleolar left ankle fracture Patient underwent open reduction and internal fixation on 6/27. She is nonweightbearing on that side. She will need skilled nursing facility placement for rehabilitation.  Acute Delirium This was thought to be secondary to opiates. Her narcotic pain medications were discontinued. Mental status is back to normal. Workup was negative for infectious process. CT scan of the head did not show any acute findings. According to the daughter who is at bedside. Patient is back to baseline. TSH, serum B12, ammonia--all unremarkable. We will send her out on lower dose of her antipsychotic.  Diabetes mellitus type 2 HbA1c was 8.3. She was requiring lower dosage of her Levemir in the hospital. This will need to be monitored closely in the skilled nursing facility and dose should be adjusted accordingly.  History of essentialHypertension Blood pressure is reasonably well controlled. Continue with her home medications.  Acute on chronic CKD stage IV Baseline creatinine 3.2-3.5. Creatinine rose to around 4. She was given fluids and her diuretics were held briefly. Renal function is back to baseline. She is followed by a nephrologist at the [place] [facility]. She has a fistula in her left upper extremity with plans to initiate dialysis in the near future. She may follow up with her outpatient providers.  Chronic systolic and diastolic CHF Echocardiogram report as above. EF is 55-60%. Continue her diuretics. Continue Imdur and hydralazine for now.  History ofbipolar disorder Her home medication regimen has been adjusted.   Overall  stable. She is back to baseline mentally. She is noted to have low-grade fever but denies any cough, dysuria, abdominal pain, nausea, vomiting. Likely postoperative. Overall stable to go to skilled nursing facility.   PERTINENT LABS:  The results of significant diagnostics from this hospitalization (including imaging, microbiology, ancillary and laboratory) are listed below for reference.    Microbiology: Recent Results (from the past 240 hour(s))  Surgical pcr screen     Status: None   Collection Time: 11/17/14  3:40 PM  Result Value Ref Range Status   MRSA, PCR NEGATIVE NEGATIVE Final   Staphylococcus aureus NEGATIVE NEGATIVE Final    Comment:        The Xpert SA Assay (FDA approved for NASAL specimens in patients over 39 years of age), is one component of a comprehensive surveillance program.  Test performance has been validated by [facility] for patients greater than or equal to 82 year old. It is not intended to diagnose infection nor to guide or monitor treatment.   Urine culture     Status: None   Collection Time: 11/18/14  7:00 PM  Result Value Ref Range Status   Specimen Description URINE, CATHETERIZED  Final   Special Requests NONE  Final   Culture NO GROWTH 2 DAYS  Final   Report Status 11/20/2014 FINAL  Final  Urine culture     Status: None   Collection Time: 11/21/14 12:48 PM  Result Value Ref Range Status   Specimen Description URINE, CLEAN CATCH  Final   Special Requests NONE  Final   Culture NO GROWTH 1 DAY  Final   Report Status 11/22/2014 FINAL  Final     Labs: Basic Metabolic Panel:  Recent Labs Lab 11/17/14 1910 11/18/14 0433 11/19/14 0401 11/20/14 0605 11/21/14 0745 11/22/14 0603  NA 136 134* 137 136 138 139  K 3.7 4.0 3.6 3.9 4.0 3.9  CL 105 104 106 106 109 106  CO2 21* 21* 22 23 16* 20*  GLUCOSE 328* 236* 146* 226* 145* 112*  BUN 44* 47* 52* 48* 41* 41*  CREATININE 3.78* 3.89* 4.27* 3.73* 3.41* 3.59*  CALCIUM 8.6* 8.5* 8.7* 9.0 8.6*  9.0  MG 2.0  --   --   --   --   --   PHOS 3.9  --   --   --   --   --    Liver Function Tests:  Recent Labs Lab 11/18/14 0433 11/19/14 0401  AST 17 15  ALT 13* 13*  ALKPHOS 71 65  BILITOT 0.5 0.5  PROT 6.0* 6.4*  ALBUMIN 2.6* 2.5*    Recent Labs Lab 11/18/14 1505  AMMONIA 18   CBC:  Recent Labs Lab 11/17/14 1457 11/17/14 1910  WBC  --  7.5  HGB 10.5* 9.4*  HCT 31.0* 29.7*  MCV  --  93.7  PLT  --  213   BNP: BNP (last 3 results)  Recent Labs  11/17/14 1910  BNP 492.8*   CBG:  Recent Labs Lab 11/21/14 0654 11/21/14 1139 11/21/14 1644 11/21/14 2147 11/22/14 0645  GLUCAP 160* 108* 148* 140* 105*     IMAGING STUDIES Dg Ankle Complete Left  11/19/2014   CLINICAL DATA:  Known left ankle fracture.  EXAM: LEFT ANKLE COMPLETE - 3+ VIEW  COMPARISON:  None.  FINDINGS: Exam detail is diminished secondary to casting material. There is a trimalleolar fracture and dislocation of the tibiotalar joint.  IMPRESSION: 1. Trimalleolar fracture and tibiotalar joint dislocation.   Electronically  Signed   By: Kerby Moors M.D.   On: 11/19/2014 09:09   Ct Head Wo Contrast  11/18/2014   CLINICAL DATA:  Acute encephalopathy.  Delerium.   Multiple falls.  EXAM: CT HEAD WITHOUT CONTRAST  TECHNIQUE: Contiguous axial images were obtained from the base of the skull through the vertex without intravenous contrast.  COMPARISON:  Multiple exams, including 08/02/2009  FINDINGS: The brainstem, cerebellum, cerebral peduncles, thalamus, basal ganglia, basilar cisterns, and ventricular system appear within normal limits. No intracranial hemorrhage, mass lesion, or acute CVA.  There is atherosclerotic calcification of the cavernous carotid arteries bilaterally.  IMPRESSION: 1. No acute intracranial findings. 2. There is atherosclerotic calcification of the cavernous carotid arteries bilaterally.   Electronically Signed   By: Van Clines M.D.   On: 11/18/2014 15:34   Dg Chest Port 1  View  11/17/2014   CLINICAL DATA:  Admission today for left leg fracture. History of hypertension diabetes. No sleep apnea. History of smoking.  EXAM: PORTABLE CHEST - 1 VIEW  COMPARISON:  11/28/2009  FINDINGS: The heart is mildly enlarged. There are no focal consolidations or pleural effusions. No pulmonary edema. Overlying the left lateral sixth rib and left scapula there is a density of uncertain significance. This may represent confluence of normal structures but follow-up PA and lateral chest may be helpful in excluding a lung nodule.  IMPRESSION: 1. Cardiomegaly without edema. 2. Density overlying the left lung of uncertain significance. Because a lung nodule cannot be excluded a follow-up PA and lateral chest x-ray is recommended when the patient is able. Alternatively chest CT can be performed.   Electronically Signed   By: Nolon Nations M.D.   On: 11/17/2014 18:42   Dg Ankle Left Port  11/20/2014   CLINICAL DATA:  Subsequent encounter for left ankle fracture  EXAM: PORTABLE LEFT ANKLE - 2 VIEW  COMPARISON:  11/19/2014.  FINDINGS: Three-view left ankle has fine needle obscured by the overlying plaster. Two cannulated compression screws transfix the medial malleolar fracture. It third cannulated screw is seen in the distal tibia. The patient is status post lateral plate and screw fixation of the distal fibula. Bony alignment on the lateral film is markedly improved compared to the preoperative study from yesterday.  IMPRESSION: Status post ORIF for ankle fracture.   Electronically Signed   By: Misty Stanley M.D.   On: 11/20/2014 16:52    DISCHARGE EXAMINATION: Filed Vitals:   11/21/14 1326 11/21/14 2153 11/22/14 0428 11/22/14 0500  BP: 149/64 145/63 149/60   Pulse: 76 81 80   Temp: 98.7 F (37.1 C) 98.8 F (37.1 C) 99.5 F (37.5 C)   TempSrc:  Oral    Resp: 16 16 15    Height:      Weight:    87.454 kg (192 lb 12.8 oz)  SpO2: 97% 98% 96%    General appearance: alert, cooperative,  appears stated age and no distress Resp: clear to auscultation bilaterally Cardio: regular rate and rhythm, S1, S2 normal, no murmur, click, rub or gallop GI: soft, non-tender; bowel sounds normal; no masses,  no organomegaly Neurologic: Alert and oriented 3. No focal neurological deficits are noted.  DISPOSITION: Skilled nursing facility  Discharge Instructions    Call MD for:  difficulty breathing, headache or visual disturbances    Complete by:  As directed      Call MD for:  extreme fatigue    Complete by:  As directed      Call MD for:  persistant dizziness or light-headedness    Complete by:  As directed      Call MD for:  persistant nausea and vomiting    Complete by:  As directed      Call MD for:  severe uncontrolled pain    Complete by:  As directed      Call MD for:  temperature >100.4    Complete by:  As directed      Diet Carb Modified    Complete by:  As directed      Discharge instructions    Complete by:  As directed   CBG's 4 times daily. Adjust Levemir dose accordingly. Please note that her Seroquel dose has been reduced.  You were cared for by a hospitalist during your hospital stay. If you have any questions about your discharge medications or the care you received while you were in the hospital after you are discharged, you can call the unit and asked to speak with the hospitalist on call if the hospitalist that took care of you is not available. Once you are discharged, your primary care physician will handle any further medical issues. Please note that NO REFILLS for any discharge medications will be authorized once you are discharged, as it is imperative that you return to your primary care physician (or establish a relationship with a primary care physician if you do not have one) for your aftercare needs so that they can reassess your need for medications and monitor your lab values. If you do not have a primary care physician, you can call [phone] for a  physician referral.     Increase activity slowly    Complete by:  As directed      Non weight bearing    Complete by:  As directed   Laterality:  left  Extremity:  Lower           ALLERGIES: No Known Allergies   Current Discharge Medication List    START taking these medications   Details  aspirin 325 MG tablet Take 1 tablet (325 mg total) by mouth daily. Qty: 30 tablet, Refills: 0    docusate sodium (COLACE) 100 MG capsule Take 1 capsule (100 mg total) by mouth 2 (two) times daily. Qty: 10 capsule, Refills: 0    ondansetron (ZOFRAN) 4 MG tablet Take 1 tablet (4 mg total) by mouth every 8 (eight) hours as needed for nausea or vomiting. Qty: 20 tablet, Refills: 0    traMADol (ULTRAM) 50 MG tablet Take 1 tablet (50 mg total) by mouth every 6 (six) hours as needed. Qty: 90 tablet, Refills: 0      CONTINUE these medications which have CHANGED   Details  insulin detemir (LEVEMIR) 100 UNIT/ML injection Inject 0.12 mLs (12 Units total) into the skin at bedtime. Qty: 10 mL, Refills: 11    QUEtiapine (SEROQUEL) 100 MG tablet Take 2 tablets (200 mg total) by mouth at bedtime.      CONTINUE these medications which have NOT CHANGED   Details  albuterol (PROVENTIL HFA;VENTOLIN HFA) 108 (90 BASE) MCG/ACT inhaler Inhale 1-2 puffs into the lungs every 6 (six) hours as needed for wheezing or shortness of breath.    amLODipine (NORVASC) 10 MG tablet Take 10 mg by mouth daily.    benztropine (COGENTIN) 0.5 MG tablet Take 0.5 mg by mouth 2 (two) times daily.    bumetanide (BUMEX) 2 MG tablet Take 2 mg by mouth daily.    carvedilol (COREG) 25 MG tablet  Take 25 mg by mouth 2 (two) times daily with a meal.    darbepoetin (ARANESP) 40 MCG/0.4ML SOLN injection Inject 40 mcg into the skin.    ferrous gluconate (FERGON) 324 MG tablet Take 324 mg by mouth daily with breakfast.    fluPHENAZine (PROLIXIN) 1 MG tablet Take 1 mg by mouth daily.    gabapentin (NEURONTIN) 300 MG capsule Take  300 mg by mouth 2 (two) times daily.    hydrALAZINE (APRESOLINE) 100 MG tablet Take 100 mg by mouth 2 (two) times daily.    insulin aspart (NOVOLOG) 100 UNIT/ML injection Inject 7-15 Units into the skin 3 (three) times daily before meals.    isosorbide mononitrate (IMDUR) 60 MG 24 hr tablet Take 60 mg by mouth daily.    Multiple Vitamins-Minerals (MULTIVITAMIN WITH MINERALS) tablet Take 1 tablet by mouth daily.    pantoprazole (PROTONIX) 20 MG tablet Take 20 mg by mouth daily.      STOP taking these medications     PRESCRIPTION MEDICATION        Follow-up Information    Follow up with MURPHY, TIMOTHY D, MD In 1 week.   Specialty:  Orthopedic Surgery   Contact information:   [address] [place] [address] [phone]       TOTAL DISCHARGE TIME: 35 minutes  [facility] Pager [phone]  11/22/2014, 1:11 PM

## 2014-11-22 NOTE — Progress Notes (Signed)
Physical Therapy Treatment Patient Details Name: Robin [name] MRN: [id number] DOB: 1950/09/11 Today's Date: 11/22/2014    History of Present Illness 64 yo female post fall with delayed ankle fracture ORIF.      PT Comments    Pt presenting more alert today and able to participate in PT session, though still with decreased problem solving, slow processing, impaired memory and decreased safety awareness affecting mobility. Pt's daughter in room and reports plan is still SNF due to her being unable to manage pt at this level at home. Pt able to perform min to mod A transfer with RW today OOB to recliner but demonstrates difficulty maintaining NWB status with max verbal cues. Continue to recommend SNF and continued PT to address mobility and overall strength/endurance.   Follow Up Recommendations  SNF     Equipment Recommendations  None recommended by PT    Recommendations for Other Services       Precautions / Restrictions Precautions Precautions: Fall Restrictions Weight Bearing Restrictions: Yes LLE Weight Bearing: Non weight bearing    Mobility  Bed Mobility Overal bed mobility: Needs Assistance Bed Mobility: Supine to Sit     Supine to sit: Min assist     General bed mobility comments: Requires cues for sequencing and min A for management of LLE   Transfers Overall transfer level: Needs assistance Equipment used: Rolling walker (2 wheeled) Transfers: Stand Pivot Transfers;Sit to/from Stand Sit to Stand: Mod assist Stand pivot transfers: Min assist;+2 safety/equipment       General transfer comment: Pt performed stand pivot transfer with RW to recliner with a few small hops. Requires max verbal cues for maintaining NWB status on LLE and cues for sequencing. +2 present for safety  Ambulation/Gait Ambulation/Gait assistance: Mod assist Ambulation Distance (Feet): 1 Feet Assistive device: Rolling walker (2 wheeled)       General Gait Details: Pt able to take a  few small hops during transfer forward and back but requires mod A due to therapist attempting to help pt maintain NWB status on LLE   Stairs            Wheelchair Mobility    Modified Rankin (Stroke Patients Only)       Balance Overall balance assessment: Needs assistance Sitting-balance support: Single extremity supported;Bilateral upper extremity supported Sitting balance-Leahy Scale: Good     Standing balance support: Bilateral upper extremity supported Standing balance-Leahy Scale: Poor Standing balance comment: requires use of RW for support due to NWB status and requires min A for balance                    Cognition Arousal/Alertness: Awake/alert Behavior During Therapy: Flat affect Overall Cognitive Status: Impaired/Different from baseline Area of Impairment: Problem solving;Memory     Memory: Decreased short-term memory       Problem Solving: Difficulty sequencing;Requires verbal cues;Slow processing General Comments: Pt requiring multi modal cues for sequencing and problem solving functional mobility tasks. Also decreased memory for WB status and requires max verbal cues to attempt to maintain during upright mobiilty.    Exercises General Exercises - Lower Extremity Short Arc Quad: Strengthening;AROM;5 reps;Seated Straight Leg Raises: AAROM;Strengthening;Left;Supine;5 reps    General Comments General comments (skin integrity, edema, etc.): elevated LLE for edema control and pain management at end of session      Pertinent Vitals/Pain Pain Assessment: 0-10 Pain Score: 4  Pain Location: LLE Pain Descriptors / Indicators: Aching Pain Intervention(s): Monitored during session;Repositioned    Home  Living                      Prior Function            PT Goals (current goals can now be found in the care plan section) Acute Rehab PT Goals PT Goal Formulation: With patient/family Time For Goal Achievement: 12/02/14 Potential to  Achieve Goals: Good Progress towards PT goals: Progressing toward goals    Frequency  Min 3X/week    PT Plan Current plan remains appropriate    Co-evaluation             End of Session Equipment Utilized During Treatment: Gait belt Activity Tolerance: Patient tolerated treatment well Patient left: in chair;with call bell/phone within reach;with family/visitor present     Time: 0955-1006 PT Time Calculation (min) (ACUTE ONLY): 11 min  Charges:  $Therapeutic Activity: 8-22 mins                    G Codes:      Juanna Cao, PT, DPT Pager #: [phone]  11/22/2014, 10:15 AM

## 2014-11-22 NOTE — Discharge Instructions (Signed)
Keep splint clean and dry till follow up   Non-weight bearing in the L Leg  ASA 325mg  daily for 3 weeks for DVT prophylaxis  Please hold the unknown medication till we know what it is.  Please check BMET in 1 week for CKD. Call results to her PCP at [facility].

## 2014-11-22 NOTE — Progress Notes (Signed)
Discharge report called to [facility].Marland Kitchen

## 2014-11-22 NOTE — Progress Notes (Signed)
Patient discharged to [facility] via her daughter Dewaine Oats. Packet given to daughter for nursing home. Report will be called to [facility] at [phone].

## 2014-11-22 NOTE — Clinical Social Work Placement (Signed)
   CLINICAL SOCIAL WORK PLACEMENT  NOTE  Date:  11/22/2014  Patient Details  Name: Robin [name] MRN: [id number] Date of Birth: 02/08/1951  Clinical Social Work is seeking post-discharge placement for this patient at the [place] level of care (*CSW will initial, date and re-position this form in  chart as items are completed):  Yes   Patient/family provided with [place] Work Department's list of facilities offering this level of care within the geographic area requested by the patient (or if unable, by the patient's family).  Yes   Patient/family informed of their freedom to choose among providers that offer the needed level of care, that participate in Medicare, Medicaid or managed care program needed by the patient, have an available bed and are willing to accept the patient.  Yes   Patient/family informed of [facility]'s ownership interest in [facility] and [name], as well as of the fact that they are under no obligation to receive care at these facilities.  PASRR submitted to EDS on 11/21/14     PASRR number received on 11/21/14     Existing PASRR number confirmed on       FL2 transmitted to all facilities in geographic area requested by pt/family on 11/21/14     FL2 transmitted to all facilities within larger geographic area on       Patient informed that his/her managed care company has contracts with or will negotiate with certain facilities, including the following:        Yes   Patient/family informed of bed offers received.  Patient chooses bed at [facility]     Physician recommends and patient chooses bed at  (none)    Patient to be transferred to   on 11/21/14.  Patient to be transferred to facility by daughter     Patient family notified on 11/22/14 of transfer.  Name of family member notified:  Evette     PHYSICIAN Please prepare priority discharge summary, including medications     Additional Comment:     _______________________________________________ Dulcy Fanny, LCSW 11/22/2014, 12:29 PM

## 2014-11-23 ENCOUNTER — Non-Acute Institutional Stay (SKILLED_NURSING_FACILITY): Payer: Medicare Other | Admitting: Internal Medicine

## 2014-11-23 DIAGNOSIS — I130 Hypertensive heart and chronic kidney disease with heart failure and stage 1 through stage 4 chronic kidney disease, or unspecified chronic kidney disease: Secondary | ICD-10-CM

## 2014-11-23 DIAGNOSIS — K219 Gastro-esophageal reflux disease without esophagitis: Secondary | ICD-10-CM | POA: Diagnosis not present

## 2014-11-23 DIAGNOSIS — R5381 Other malaise: Secondary | ICD-10-CM

## 2014-11-23 DIAGNOSIS — F319 Bipolar disorder, unspecified: Secondary | ICD-10-CM

## 2014-11-23 DIAGNOSIS — E1142 Type 2 diabetes mellitus with diabetic polyneuropathy: Secondary | ICD-10-CM

## 2014-11-23 DIAGNOSIS — N189 Chronic kidney disease, unspecified: Secondary | ICD-10-CM | POA: Diagnosis not present

## 2014-11-23 DIAGNOSIS — S82892A Other fracture of left lower leg, initial encounter for closed fracture: Secondary | ICD-10-CM | POA: Diagnosis not present

## 2014-11-23 DIAGNOSIS — D631 Anemia in chronic kidney disease: Secondary | ICD-10-CM

## 2014-11-23 DIAGNOSIS — I509 Heart failure, unspecified: Secondary | ICD-10-CM | POA: Diagnosis not present

## 2014-11-23 DIAGNOSIS — N184 Chronic kidney disease, stage 4 (severe): Secondary | ICD-10-CM | POA: Diagnosis not present

## 2014-11-23 DIAGNOSIS — I5042 Chronic combined systolic (congestive) and diastolic (congestive) heart failure: Secondary | ICD-10-CM

## 2014-11-23 DIAGNOSIS — K5900 Constipation, unspecified: Secondary | ICD-10-CM | POA: Diagnosis not present

## 2014-11-23 DIAGNOSIS — IMO0001 Reserved for inherently not codable concepts without codable children: Secondary | ICD-10-CM

## 2014-11-23 NOTE — Progress Notes (Signed)
[name], female   DOB: 12-Nov-1950, 65 y.o.   MRN: [id number]     Facility: [name]    PCP: No primary care provider on file.  Code Status: DNR  No Known Allergies  Chief Complaint  Patient presents with  . New Admit To SNF     HPI:  64 year old patient is here for short term rehabilitation post hospital admission from 11/17/14-11/22/14 with left ankle fracture. She underwent open reduction and internal fixation of her left ankle fracture on 11/20/14. She has PMH of DM, HTN, CAD, CKD stage 4, bipolar disorder. Post op she had acute delirium thought to be from opioids. Infection workup was negative and ct of the head ruled out acute intracranial abnormalities. She returned back to baseline with discontinuation of her narcotics. She is seen in her room today. She mentions being in pain but as per staff, she is not asking for her pain medications. She complaints of occasional muscle tightness in her toes. She had a small bowel movement last night. Denies any other concerns.   Review of Systems:  Constitutional: Negative for fever, chills, diaphoresis.  HENT: Negative for headache, congestion, nasal discharge Eyes: Negative for eye pain, blurred vision, double vision and discharge.  Respiratory: Negative for cough, shortness of breath and wheezing.   Cardiovascular: Negative for chest pain, palpitations, leg swelling.  Gastrointestinal: Negative for heartburn, nausea, vomiting, abdominal pain Genitourinary: Negative for dysuria  Skin: Negative for itching, rash.  Neurological: Negative for dizziness, tingling, focal weakness    Past Medical History  Diagnosis Date  . Coronary artery disease   . Bipolar disorder   . Anemia   . History of blood transfusion   . DM type 2, uncontrolled, with renal complications   . Benign hypertension    Past Surgical History  Procedure Laterality Date  . Cardiac catheterization      2 stent   . Av  fistula placement Left may-2016    done at [facility]  . Tonsillectomy    . Abdominal hysterectomy    . Cholecystectomy    . Ankle closed reduction N/A 11/17/2014    Procedure: CLOSED REDUCTION ANKLE;  Surgeon: Earlie Server, MD;  Location: [place];  Service: Orthopedics;  Laterality: N/A;  . Orif ankle fracture Left 11/20/2014    Procedure: OPEN REDUCTION INTERNAL FIXATION (ORIF) ANKLE FRACTURE;  Surgeon: Renette Butters, MD;  Location: [place];  Service: Orthopedics;  Laterality: Left;   Social History:   reports that she has been smoking.  She does not have any smokeless tobacco history on file. She reports that she does not drink alcohol or use illicit drugs.  No family history on file.  Medications: Patient's Medications  New Prescriptions   No medications on file  Previous Medications   ALBUTEROL (PROVENTIL HFA;VENTOLIN HFA) 108 (90 BASE) MCG/ACT INHALER    Inhale 1-2 puffs into the lungs every 6 (six) hours as needed for wheezing or shortness of breath.   AMLODIPINE (NORVASC) 10 MG TABLET    Take 10 mg by mouth daily.   ASPIRIN 325 MG TABLET    Take 1 tablet (325 mg total) by mouth daily.   BENZTROPINE (COGENTIN) 0.5 MG TABLET    Take 0.5 mg by mouth 2 (two) times daily.   BUMETANIDE (BUMEX) 2 MG TABLET    Take 2 mg by mouth daily.   CARVEDILOL (COREG) 25 MG TABLET    Take 25 mg by mouth  2 (two) times daily with a meal.   DARBEPOETIN (ARANESP) 40 MCG/0.4ML SOLN INJECTION    Inject 40 mcg into the skin.   DOCUSATE SODIUM (COLACE) 100 MG CAPSULE    Take 1 capsule (100 mg total) by mouth 2 (two) times daily.   FERROUS GLUCONATE (FERGON) 324 MG TABLET    Take 324 mg by mouth daily with breakfast.   FLUPHENAZINE (PROLIXIN) 1 MG TABLET    Take 1 mg by mouth daily.   GABAPENTIN (NEURONTIN) 300 MG CAPSULE    Take 300 mg by mouth 2 (two) times daily.   HYDRALAZINE (APRESOLINE) 100 MG TABLET    Take 100 mg by mouth 2 (two) times daily.   INSULIN ASPART (NOVOLOG) 100 UNIT/ML INJECTION     Inject 7-15 Units into the skin 3 (three) times daily before meals.   INSULIN DETEMIR (LEVEMIR) 100 UNIT/ML INJECTION    Inject 0.12 mLs (12 Units total) into the skin at bedtime.   ISOSORBIDE MONONITRATE (IMDUR) 60 MG 24 HR TABLET    Take 60 mg by mouth daily.   MULTIPLE VITAMINS-MINERALS (MULTIVITAMIN WITH MINERALS) TABLET    Take 1 tablet by mouth daily.   ONDANSETRON (ZOFRAN) 4 MG TABLET    Take 1 tablet (4 mg total) by mouth every 8 (eight) hours as needed for nausea or vomiting.   PANTOPRAZOLE (PROTONIX) 20 MG TABLET    Take 20 mg by mouth daily.   QUETIAPINE (SEROQUEL) 100 MG TABLET    Take 2 tablets (200 mg total) by mouth at bedtime.   TRAMADOL (ULTRAM) 50 MG TABLET    Take 1 tablet (50 mg total) by mouth every 6 (six) hours as needed.  Modified Medications   No medications on file  Discontinued Medications   No medications on file     Physical Exam: Filed Vitals:   11/23/14 1904  BP: 139/72  Pulse: 73  Temp: 97 F (36.1 C)  Resp: 18  Weight: 180 lb (81.647 kg)  SpO2: 99%    General- elderly female, in no acute distress Head- normocephalic, atraumatic Throat- moist mucus membrane Eyes- no pallor, no icterus, no discharge, normal conjunctiva, normal sclera Neck- no cervical lymphadenopathy Cardiovascular- normal s1,s2, no murmurs, palpable dorsalis pedis Respiratory- bilateral clear to auscultation, no wheeze, no rhonchi, no crackles, no use of accessory muscles Abdomen- bowel sounds present, soft, non tender Musculoskeletal- able to move all 4 extremities, NWB to LLE, left leg in cast, good capillary refill to the toes and able to move her toes. Neurological- no focal deficit Skin- warm and dry. Left arm AV fistula. Left leg calciphylaxis noted.   Labs reviewed: Basic Metabolic Panel:  Recent Labs  11/17/14 1910  11/20/14 0605 11/21/14 0745 11/22/14 0603  NA 136  < > 136 138 139  K 3.7  < > 3.9 4.0 3.9  CL 105  < > 106 109 106  CO2 21*  < > 23 16* 20*    GLUCOSE 328*  < > 226* 145* 112*  BUN 44*  < > 48* 41* 41*  CREATININE 3.78*  < > 3.73* 3.41* 3.59*  CALCIUM 8.6*  < > 9.0 8.6* 9.0  MG 2.0  --   --   --   --   PHOS 3.9  --   --   --   --   < > = values in this interval not displayed. Liver Function Tests:  Recent Labs  11/18/14 0433 11/19/14 0401  AST 17 15  ALT  13* 13*  ALKPHOS 71 65  BILITOT 0.5 0.5  PROT 6.0* 6.4*  ALBUMIN 2.6* 2.5*   No results for input(s): LIPASE, AMYLASE in the last 8760 hours.  Recent Labs  11/18/14 1505  AMMONIA 18   CBC:  Recent Labs  11/17/14 1457 11/17/14 1910  WBC  --  7.5  HGB 10.5* 9.4*  HCT 31.0* 29.7*  MCV  --  93.7  PLT  --  213   Cardiac Enzymes: No results for input(s): CKTOTAL, CKMB, CKMBINDEX, TROPONINI in the last 8760 hours. BNP: Invalid input(s): POCBNP CBG:  Recent Labs  11/21/14 1644 11/21/14 2147 11/22/14 0645  GLUCAP 148* 140* 105*    Radiological Exams: 2-D echocardiogram Study Conclusions - Left ventricle: The cavity size was normal. Wall thickness was increased in a pattern of moderate LVH. Systolic function was normal. The estimated ejection fraction was in the range of 55% to 60%. Wall motion was normal; there were no regional wall motion abnormalities. Features are consistent with a pseudonormal left ventricular filling pattern, with concomitant abnormal relaxation and increased filling pressure (grade 2 diastolic dysfunction). - Aortic valve: Mildly calcified annulus. Trileaflet; mildly thickened leaflets. Valve area (VTI): 2.22 cm^2. Valve area (Vmax): 2.18 cm^2. - Mitral valve: Mildly calcified annulus. Mildly thickened leaflets - Left atrium: The atrium was mildly dilated.   Assessment/Plan  Physical deconditioning Will have patient work with PT/OT as tolerated to regain strength and restore function.  Fall precautions are in place.  Left ankle fracture S/p ORIF. NWB to LLE. Has follow up with orthopedics. Continue aspirin 325 mg daily for  dvt prophylaxis. On tramadol 50 mg qid prn pain, advised to ask for pain medication. Pt is alert. Avoid scheduled narcotics given her acute delirium episode in hospital. To work with therapy team  Anemia of chronic disease Continue aransep for hb < 10. Continue fergon 324 mg daily. Monitor h&h.  ckd stage 4 Has AV fistula, has calciphylaxis changes. Monitor clinically.   Dm type 2 with neuropathy a1c 8.3. Monitor cbg. Continue levemir 12 u daily and SSI with meals. cbg low in hosipital and thus levemir dosing was reduced. adjsut dosing if needed. Continue gabapentin 300 mg bid  HTN On amlodipine 10 mg daily, coreg 25 mg bid, hydralazine 100 mg bid, imdur 60 mg daily. bp stable. Monitor bp readings  Constipation Continue colace 100 mg bid  Chronic CHF EF 55-60%.Marland Kitchen euvolemic on exam. Continue bumex 2 mg daily, coreg 25 mg bid, hydralazine 100 mg bid, imdur 60 mg daily.  Bipolar disorder Stable mood. Continue seroquel 200 mg daily, fluphenazine 1 mg daily and cogentin 0.1 mg daily  gerd Stable, continue pantoprazole 20 mg daily and monitor   Goals of care: short term rehabilitation   Family/ staff Communication: reviewed care plan with patient and nursing supervisor    Blanchie Serve, MD  [place] [phone] (Monday-Friday 8 am - 5 pm) [phone] (afterhours)

## 2014-11-30 ENCOUNTER — Non-Acute Institutional Stay (SKILLED_NURSING_FACILITY): Payer: Medicare Other | Admitting: Internal Medicine

## 2014-11-30 DIAGNOSIS — Z720 Tobacco use: Secondary | ICD-10-CM | POA: Diagnosis not present

## 2014-11-30 DIAGNOSIS — F17200 Nicotine dependence, unspecified, uncomplicated: Secondary | ICD-10-CM

## 2014-11-30 DIAGNOSIS — D62 Acute posthemorrhagic anemia: Secondary | ICD-10-CM | POA: Diagnosis not present

## 2014-11-30 DIAGNOSIS — E1165 Type 2 diabetes mellitus with hyperglycemia: Secondary | ICD-10-CM

## 2014-12-01 ENCOUNTER — Emergency Department (HOSPITAL_COMMUNITY)
Admission: EM | Admit: 2014-12-01 | Discharge: 2014-12-01 | Disposition: A | Discharge status: Home / Self Care | Payer: Medicare Other | Attending: Emergency Medicine | Admitting: Emergency Medicine

## 2014-12-01 ENCOUNTER — Encounter (HOSPITAL_COMMUNITY): Payer: Self-pay

## 2014-12-01 ENCOUNTER — Other Ambulatory Visit: Payer: Self-pay

## 2014-12-01 DIAGNOSIS — N189 Chronic kidney disease, unspecified: Secondary | ICD-10-CM

## 2014-12-01 DIAGNOSIS — Z7951 Long term (current) use of inhaled steroids: Secondary | ICD-10-CM | POA: Insufficient documentation

## 2014-12-01 DIAGNOSIS — Z7982 Long term (current) use of aspirin: Secondary | ICD-10-CM | POA: Insufficient documentation

## 2014-12-01 DIAGNOSIS — R2243 Localized swelling, mass and lump, lower limb, bilateral: Secondary | ICD-10-CM | POA: Insufficient documentation

## 2014-12-01 DIAGNOSIS — F319 Bipolar disorder, unspecified: Secondary | ICD-10-CM | POA: Insufficient documentation

## 2014-12-01 DIAGNOSIS — I129 Hypertensive chronic kidney disease with stage 1 through stage 4 chronic kidney disease, or unspecified chronic kidney disease: Secondary | ICD-10-CM | POA: Diagnosis not present

## 2014-12-01 DIAGNOSIS — I2510 Atherosclerotic heart disease of native coronary artery without angina pectoris: Secondary | ICD-10-CM | POA: Insufficient documentation

## 2014-12-01 DIAGNOSIS — Z79899 Other long term (current) drug therapy: Secondary | ICD-10-CM | POA: Insufficient documentation

## 2014-12-01 DIAGNOSIS — Z720 Tobacco use: Secondary | ICD-10-CM | POA: Insufficient documentation

## 2014-12-01 DIAGNOSIS — D649 Anemia, unspecified: Secondary | ICD-10-CM | POA: Insufficient documentation

## 2014-12-01 DIAGNOSIS — E1129 Type 2 diabetes mellitus with other diabetic kidney complication: Secondary | ICD-10-CM | POA: Diagnosis not present

## 2014-12-01 DIAGNOSIS — Z794 Long term (current) use of insulin: Secondary | ICD-10-CM | POA: Insufficient documentation

## 2014-12-01 DIAGNOSIS — Z9889 Other specified postprocedural states: Secondary | ICD-10-CM | POA: Insufficient documentation

## 2014-12-01 DIAGNOSIS — R799 Abnormal finding of blood chemistry, unspecified: Secondary | ICD-10-CM | POA: Diagnosis present

## 2014-12-01 LAB — CBC WITH DIFFERENTIAL/PLATELET
BASOS PCT: 1 % (ref 0–1)
Basophils Absolute: 0.1 10*3/uL (ref 0.0–0.1)
Eosinophils Absolute: 0.2 10*3/uL (ref 0.0–0.7)
Eosinophils Relative: 3 % (ref 0–5)
HEMATOCRIT: 23.1 % — AB (ref 36.0–46.0)
Hemoglobin: 7.3 g/dL — ABNORMAL LOW (ref 12.0–15.0)
Lymphocytes Relative: 16 % (ref 12–46)
Lymphs Abs: 1.4 10*3/uL (ref 0.7–4.0)
MCH: 29.2 pg (ref 26.0–34.0)
MCHC: 31.6 g/dL (ref 30.0–36.0)
MCV: 92.4 fL (ref 78.0–100.0)
MONO ABS: 0.5 10*3/uL (ref 0.1–1.0)
MONOS PCT: 6 % (ref 3–12)
Neutro Abs: 6.4 10*3/uL (ref 1.7–7.7)
Neutrophils Relative %: 74 % (ref 43–77)
Platelets: 342 10*3/uL (ref 150–400)
RBC: 2.5 MIL/uL — ABNORMAL LOW (ref 3.87–5.11)
RDW: 16.9 % — ABNORMAL HIGH (ref 11.5–15.5)
WBC: 8.5 10*3/uL (ref 4.0–10.5)

## 2014-12-01 LAB — COMPREHENSIVE METABOLIC PANEL
ALT: 12 U/L — ABNORMAL LOW (ref 14–54)
AST: 19 U/L (ref 15–41)
Albumin: 2.5 g/dL — ABNORMAL LOW (ref 3.5–5.0)
Alkaline Phosphatase: 79 U/L (ref 38–126)
Anion gap: 10 (ref 5–15)
BUN: 66 mg/dL — ABNORMAL HIGH (ref 6–20)
CALCIUM: 9.1 mg/dL (ref 8.9–10.3)
CO2: 23 mmol/L (ref 22–32)
Chloride: 103 mmol/L (ref 101–111)
Creatinine, Ser: 4.18 mg/dL — ABNORMAL HIGH (ref 0.44–1.00)
GFR calc non Af Amer: 10 mL/min — ABNORMAL LOW (ref >60)
GFR, EST AFRICAN AMERICAN: 12 mL/min — AB (ref >60)
Glucose, Bld: 123 mg/dL — ABNORMAL HIGH (ref 65–99)
Potassium: 4.4 mmol/L (ref 3.5–5.1)
SODIUM: 136 mmol/L (ref 135–145)
TOTAL PROTEIN: 6.9 g/dL (ref 6.5–8.1)
Total Bilirubin: 0.6 mg/dL (ref 0.3–1.2)

## 2014-12-01 LAB — CBG MONITORING, ED: Glucose-Capillary: 137 mg/dL — ABNORMAL HIGH (ref 65–99)

## 2014-12-01 LAB — POC OCCULT BLOOD, ED: Fecal Occult Bld: NEGATIVE

## 2014-12-01 MED ORDER — HYDROCODONE-ACETAMINOPHEN 5-325 MG PO TABS: ORAL_TABLET | ORAL | Status: DC

## 2014-12-01 NOTE — ED Notes (Signed)
Pt sent from [name] for low hemoglobin of 7.0. Pt denies any abnormal symptoms at this time.  Pt A&Ox4 upon arrival.

## 2014-12-01 NOTE — Discharge Instructions (Signed)
Chronic Kidney Disease Chronic kidney disease occurs when the kidneys are damaged over a long period. The kidneys are two organs that lie on either side of the spine between the middle of the back and the front of the abdomen. The kidneys:   Remove wastes and extra water from the blood.   Produce important hormones. These help keep bones strong, regulate blood pressure, and help create red blood cells.   Balance the fluids and chemicals in the blood and tissues. A small amount of kidney damage may not cause problems, but a large amount of damage may make it difficult or impossible for the kidneys to work the way they should. If steps are not taken to slow down the kidney damage or stop it from getting worse, the kidneys may stop working permanently. Most of the time, chronic kidney disease does not go away. However, it can often be controlled, and those with the disease can usually live normal lives. CAUSES  The most common causes of chronic kidney disease are diabetes and high blood pressure (hypertension). Chronic kidney disease may also be caused by:   Diseases that cause the kidneys' filters to become inflamed.   Diseases that affect the immune system.   Genetic diseases.   Medicines that damage the kidneys, such as anti-inflammatory medicines.  Poisoning or exposure to toxic substances.   A reoccurring kidney or urinary infection.   A problem with urine flow. This may be caused by:   Cancer.   Kidney stones.   An enlarged prostate in males. SIGNS AND SYMPTOMS  Because the kidney damage in chronic kidney disease occurs slowly, symptoms develop slowly and may not be obvious until the kidney damage becomes severe. A person may have a kidney disease for years without showing any symptoms. Symptoms can include:   Swelling (edema) of the legs, ankles, or feet.   Tiredness (lethargy).   Nausea or vomiting.   Confusion.   Problems with urination, such as:    Decreased urine production.   Frequent urination, especially at night.   Frequent accidents in children who are potty trained.   Muscle twitches and cramps.   Shortness of breath.  Weakness.   Persistent itchiness.   Loss of appetite.  Metallic taste in the mouth.  Trouble sleeping.  Slowed development in children.  Short stature in children. DIAGNOSIS  Chronic kidney disease may be detected and diagnosed by tests, including blood, urine, imaging, or kidney biopsy tests.  TREATMENT  Most chronic kidney diseases cannot be cured. Treatment usually involves relieving symptoms and preventing or slowing the progression of the disease. Treatment may include:   A special diet. You may need to avoid alcohol and foods thatare salty and high in potassium.   Medicines. These may:   Lower blood pressure.   Relieve anemia.   Relieve swelling.   Protect the bones. HOME CARE INSTRUCTIONS   Follow your prescribed diet.   Take medicines only as directed by your health care provider. Do not take any new medicines (prescription, over-the-counter, or nutritional supplements) unless approved by your health care provider. Many medicines can worsen your kidney damage or need to have the dose adjusted.   Quit smoking if you smoke. Talk to your health care provider about a smoking cessation program.   Keep all follow-up visits as directed by your health care provider. SEEK IMMEDIATE MEDICAL CARE IF:  Your symptoms get worse or you develop new symptoms.   You develop symptoms of end-stage kidney disease. These  include:   Headaches.   Abnormally dark or light skin.   Numbness in the hands or feet.   Easy bruising.   Frequent hiccups.   Menstruation stops.   You have a fever.   You have decreased urine production.   You havepain or bleeding when urinating. MAKE SURE YOU:  Understand these instructions.  Will watch your condition.  Will  get help right away if you are not doing well or get worse. FOR MORE INFORMATION   American Association of Kidney Patients: BombTimer.gl  National Kidney Foundation: www.kidney.[place]: https://mathis.com/  [facility]: www.lifeoptions.org and www.kidneyschool.org Document Released: 02/19/2008 Document Revised: 09/26/2013 Document Reviewed: 01/09/2012 [facility] Patient Information 2015 [place], [place]. This information is not intended to replace advice given to you by your health care provider. Make sure you discuss any questions you have with your health care provider.  Anemia, Nonspecific Anemia is a condition in which the concentration of red blood cells or hemoglobin in the blood is below normal. Hemoglobin is a substance in red blood cells that carries oxygen to the tissues of the body. Anemia results in not enough oxygen reaching these tissues.  CAUSES  Common causes of anemia include:   Excessive bleeding. Bleeding may be internal or external. This includes excessive bleeding from periods (in women) or from the intestine.   Poor nutrition.   Chronic kidney, thyroid, and liver disease.  Bone marrow disorders that decrease red blood cell production.  Cancer and treatments for cancer.  HIV, AIDS, and their treatments.  Spleen problems that increase red blood cell destruction.  Blood disorders.  Excess destruction of red blood cells due to infection, medicines, and autoimmune disorders. SIGNS AND SYMPTOMS   Minor weakness.   Dizziness.   Headache.  Palpitations.   Shortness of breath, especially with exercise.   Paleness.  Cold sensitivity.  Indigestion.  Nausea.  Difficulty sleeping.  Difficulty concentrating. Symptoms may occur suddenly or they may develop slowly.  DIAGNOSIS  Additional blood tests are often needed. These help your health care provider determine the best treatment. Your health care provider  will check your stool for blood and look for other causes of blood loss.  TREATMENT  Treatment varies depending on the cause of the anemia. Treatment can include:   Supplements of iron, vitamin 123456, or folic acid.   Hormone medicines.   A blood transfusion. This may be needed if blood loss is severe.   Hospitalization. This may be needed if there is significant continual blood loss.   Dietary changes.  Spleen removal. HOME CARE INSTRUCTIONS Keep all follow-up appointments. It often takes many weeks to correct anemia, and having your health care provider check on your condition and your response to treatment is very important. SEEK IMMEDIATE MEDICAL CARE IF:   You develop extreme weakness, shortness of breath, or chest pain.   You become dizzy or have trouble concentrating.  You develop heavy vaginal bleeding.   You develop a rash.   You have bloody or black, tarry stools.   You faint.   You vomit up blood.   You vomit repeatedly.   You have abdominal pain.  You have a fever or persistent symptoms for more than 2-3 days.   You have a fever and your symptoms suddenly get worse.   You are dehydrated.  MAKE SURE YOU:  Understand these instructions.  Will watch your condition.  Will get help right away if you are not doing well or get  worse. Document Released: 06/19/2004 Document Revised: 01/12/2013 Document Reviewed: 11/05/2012 [facility] Patient Information 2015 [place], [place]. This information is not intended to replace advice given to you by your health care provider. Make sure you discuss any questions you have with your health care provider.

## 2014-12-01 NOTE — Telephone Encounter (Signed)
Rx faxed to [facility] @ [phone], phone number [phone]  

## 2014-12-01 NOTE — ED Notes (Signed)
CBG 137 

## 2014-12-01 NOTE — Progress Notes (Signed)
[name], female   DOB: [birth year], 65 y.o.   MRN: [id number]    Facility: [facility]    PCP: No primary care provider on file.  Code Status: DNR  No Known Allergies  Chief Complaint  Patient presents with  . Acute Visit    elevated blood sugar reading, abnormal lab value, smoking     HPI:  64 year old patient is seen for acute concerns. She has been having elevated blood sugar readings. cbg 190-510 on review. She has been smoking in the facility and has nicotine patch on. Currently on levemir 10 u and mentions taking levemir 55u with SSI at home. She is non compliant with her diet. Also on review of lab, there has been a drop in her hb. Denies any rectal bleed. She is here for short term rehabilitation post fracture post open reduction and internal fixation of her left ankle fracture on 11/20/14. She denies any concerns.   Review of Systems:  Constitutional: Negative for fever, chills, diaphoresis.  HENT: Negative for headache, congestion, nasal discharge Eyes: Negative for eye pain, blurred vision, double vision and discharge.  Respiratory: Negative for cough, shortness of breath and wheezing.   Cardiovascular: Negative for chest pain, palpitations, leg swelling.  Gastrointestinal: Negative for heartburn, nausea, vomiting, abdominal pain Genitourinary: Negative for dysuria  Skin: Negative for itching, rash.  Neurological: Negative for dizziness, tingling, focal weakness    Past Medical History  Diagnosis Date  . Coronary artery disease   . Bipolar disorder   . Anemia   . History of blood transfusion   . DM type 2, uncontrolled, with renal complications   . Benign hypertension    Past Surgical History  Procedure Laterality Date  . Cardiac catheterization      2 stent   . Av fistula placement Left may-2016    done at [facility]  . Tonsillectomy    . Abdominal hysterectomy    . Cholecystectomy    . Ankle closed reduction N/A  11/17/2014    Procedure: CLOSED REDUCTION ANKLE;  Surgeon: Earlie Server, MD;  Location: [place];  Service: Orthopedics;  Laterality: N/A;  . Orif ankle fracture Left 11/20/2014    Procedure: OPEN REDUCTION INTERNAL FIXATION (ORIF) ANKLE FRACTURE;  Surgeon: Renette Butters, MD;  Location: [place];  Service: Orthopedics;  Laterality: Left;   Social History:   reports that she has been smoking.  She does not have any smokeless tobacco history on file. She reports that she does not drink alcohol or use illicit drugs.  No family history on file.  Medications:   Medication List       This list is accurate as of: 11/30/14 11:59 PM.  Always use your most recent med list.               albuterol 108 (90 BASE) MCG/ACT inhaler  Commonly known as:  PROVENTIL HFA;VENTOLIN HFA  Inhale 1-2 puffs into the lungs every 6 (six) hours as needed for wheezing or shortness of breath.     amLODipine 10 MG tablet  Commonly known as:  NORVASC  Take 10 mg by mouth daily.     aspirin 325 MG tablet  Take 1 tablet (325 mg total) by mouth daily.     benztropine 0.5 MG tablet  Commonly known as:  COGENTIN  Take 0.5 mg by mouth 2 (two) times daily.     bumetanide 2 MG tablet  Commonly known as:  BUMEX  Take 2 mg by mouth daily.     carvedilol 25 MG tablet  Commonly known as:  COREG  Take 25 mg by mouth 2 (two) times daily with a meal.     darbepoetin 40 MCG/0.4ML Soln injection  Commonly known as:  ARANESP  Inject 40 mcg into the skin.     docusate sodium 100 MG capsule  Commonly known as:  COLACE  Take 1 capsule (100 mg total) by mouth 2 (two) times daily.     ferrous gluconate 324 MG tablet  Commonly known as:  FERGON  Take 324 mg by mouth daily with breakfast.     fluPHENAZine 1 MG tablet  Commonly known as:  PROLIXIN  Take 1 mg by mouth daily.     gabapentin 300 MG capsule  Commonly known as:  NEURONTIN  Take 300 mg by mouth 2 (two) times daily.     hydrALAZINE 100 MG tablet  Commonly  known as:  APRESOLINE  Take 100 mg by mouth 2 (two) times daily.     insulin aspart 100 UNIT/ML injection  Commonly known as:  novoLOG  Inject 7-15 Units into the skin 3 (three) times daily before meals.     insulin detemir 100 UNIT/ML injection  Commonly known as:  LEVEMIR  Inject 0.12 mLs (12 Units total) into the skin at bedtime.     isosorbide mononitrate 60 MG 24 hr tablet  Commonly known as:  IMDUR  Take 60 mg by mouth daily.     multivitamin with minerals tablet  Take 1 tablet by mouth daily.     ondansetron 4 MG tablet  Commonly known as:  ZOFRAN  Take 1 tablet (4 mg total) by mouth every 8 (eight) hours as needed for nausea or vomiting.     pantoprazole 20 MG tablet  Commonly known as:  PROTONIX  Take 20 mg by mouth daily.     QUEtiapine 100 MG tablet  Commonly known as:  SEROQUEL  Take 2 tablets (200 mg total) by mouth at bedtime.     traMADol 50 MG tablet  Commonly known as:  ULTRAM  Take 1 tablet (50 mg total) by mouth every 6 (six) hours as needed.       Physical Exam: 97.3, 148/78, 88, 18  General- elderly female, in no acute distress Head- normocephalic, atraumatic Throat- moist mucus membrane Eyes- no pallor, no icterus, no discharge, normal conjunctiva, normal sclera Neck- no cervical lymphadenopathy Cardiovascular- normal s1,s2, no murmurs, palpable dorsalis pedis Respiratory- bilateral clear to auscultation, no wheeze, no rhonchi, no crackles, no use of accessory muscles Abdomen- bowel sounds present, soft, non tender Musculoskeletal- able to move all 4 extremities, NWB to LLE, left leg in cast, good capillary refill to the toes and able to move her toes. Neurological- no focal deficit Skin- warm and dry. Left arm AV fistula. Left leg calciphylaxis noted.   Labs reviewed: 11/30/14 wbc 12.6, hb 7.6, hct 24.9, plt 410, na 138, k 4.9, bun 55, cr 3.43, plt 342, alb 8  Assessment/Plan  Dm type 2 with hyperglycemia a1c 8.3. Monitor cbg. increase  levemir to 30 u daily for now and have her on SSI 5-15 u and reassess cbg reading in a week to adjsut levemir dosing further. Dietary complaince reinforced. Monitor for hypoglycemia with high increase in levemir.   Acute blood loss anemia likely post op but gi bleed is another differential. Also has anemia of chronic disease component. Hemodynamically stable. On aspirin 325 mg  bid for dvt prophylaxis. Change this to aspirin ec 325 mg daily for now and increase protonix to 40 mg daily for now. Also d/c ferrous gluconate and have her on ferrous sulfate 325 mg tid for now. Guaiac stool x 3 and check cbc in am. If hb < 7, will need transfusion  Tobacco use counselled on cessation, pt not willing. D/c nicotine patch for now   Blanchie Serve, MD  [facility] [phone] (Monday-Friday 8 am - 5 pm) [phone] (afterhours)

## 2014-12-01 NOTE — ED Provider Notes (Addendum)
CSN: [id number]     Arrival date & time 12/01/14  1205 History   First MD Initiated Contact with Patient 12/01/14 1207     Chief Complaint  Patient presents with  . Abnormal Lab     (Consider location/radiation/quality/duration/timing/severity/associated sxs/prior Treatment) HPI Comments: Patient is a 64 year old female with a history of coronary artery disease status post catheterization and 2 stents, anemia with history of blood transfusion, diabetes and chronic renal disease presents today from her nursing home with abnormal labs. Patient had a hemoglobin checked in the last 24 hours which was reported to be 7. Also nursing home noted that patient was hyperglycemic today at 240. Patient has no complaints at this time. She denies chest pain, shortness of breath, fatigue, abdominal pain, nausea, vomiting, black tarry stools. Patient recently underwent ORIF for an ankle fracture about 2 weeks ago but denies any complications from this. She states recently they have changed all of her medications because she is currently in a rehabilitation facility and they want her on something different than the [place]. Not sure exactly what she is taking at this time.  Patient's hemoglobin was checked when she was hospitalized before her surgery and prior to surgery patient's hemoglobin was 9.4.  The history is provided by the patient, the EMS personnel and the nursing home.    Past Medical History  Diagnosis Date  . Coronary artery disease   . Bipolar disorder   . Anemia   . History of blood transfusion   . DM type 2, uncontrolled, with renal complications   . Benign hypertension    Past Surgical History  Procedure Laterality Date  . Cardiac catheterization      2 stent   . Av fistula placement Left may-2016    done at [facility]  . Tonsillectomy    . Abdominal hysterectomy    . Cholecystectomy    . Ankle closed reduction N/A 11/17/2014    Procedure: CLOSED REDUCTION ANKLE;  Surgeon: Earlie Server,  MD;  Location: [place];  Service: Orthopedics;  Laterality: N/A;  . Orif ankle fracture Left 11/20/2014    Procedure: OPEN REDUCTION INTERNAL FIXATION (ORIF) ANKLE FRACTURE;  Surgeon: Renette Butters, MD;  Location: [place];  Service: Orthopedics;  Laterality: Left;   History reviewed. No pertinent family history. History  Substance Use Topics  . Smoking status: Current Every Day Smoker -- 0.50 packs/day  . Smokeless tobacco: Not on file  . Alcohol Use: No   OB History    No data available     Review of Systems  All other systems reviewed and are negative.     Allergies  Review of patient's allergies indicates no known allergies.  Home Medications   Prior to Admission medications   Medication Sig Start Date End Date Taking? Authorizing Provider  albuterol (PROVENTIL HFA;VENTOLIN HFA) 108 (90 BASE) MCG/ACT inhaler Inhale 1-2 puffs into the lungs every 6 (six) hours as needed for wheezing or shortness of breath.    Historical Provider, MD  amLODipine (NORVASC) 10 MG tablet Take 10 mg by mouth daily.    Historical Provider, MD  aspirin 325 MG tablet Take 1 tablet (325 mg total) by mouth daily. 11/20/14   Brittney Claiborne Billings, PA-C  benztropine (COGENTIN) 0.5 MG tablet Take 0.5 mg by mouth 2 (two) times daily.    Historical Provider, MD  bumetanide (BUMEX) 2 MG tablet Take 2 mg by mouth daily.    Historical Provider, MD  carvedilol (COREG) 25 MG tablet Take  25 mg by mouth 2 (two) times daily with a meal.    Historical Provider, MD  darbepoetin (ARANESP) 40 MCG/0.4ML SOLN injection Inject 40 mcg into the skin.    Historical Provider, MD  docusate sodium (COLACE) 100 MG capsule Take 1 capsule (100 mg total) by mouth 2 (two) times daily. 11/20/14   Brittney Claiborne Billings, PA-C  ferrous gluconate (FERGON) 324 MG tablet Take 324 mg by mouth daily with breakfast.    Historical Provider, MD  fluPHENAZine (PROLIXIN) 1 MG tablet Take 1 mg by mouth daily.    Historical Provider, MD  gabapentin (NEURONTIN) 300 MG  capsule Take 300 mg by mouth 2 (two) times daily.    Historical Provider, MD  hydrALAZINE (APRESOLINE) 100 MG tablet Take 100 mg by mouth 2 (two) times daily.    Historical Provider, MD  insulin aspart (NOVOLOG) 100 UNIT/ML injection Inject 7-15 Units into the skin 3 (three) times daily before meals.    Historical Provider, MD  insulin detemir (LEVEMIR) 100 UNIT/ML injection Inject 0.12 mLs (12 Units total) into the skin at bedtime. 11/22/14   Bonnielee Haff, MD  isosorbide mononitrate (IMDUR) 60 MG 24 hr tablet Take 60 mg by mouth daily.    Historical Provider, MD  Multiple Vitamins-Minerals (MULTIVITAMIN WITH MINERALS) tablet Take 1 tablet by mouth daily.    Historical Provider, MD  ondansetron (ZOFRAN) 4 MG tablet Take 1 tablet (4 mg total) by mouth every 8 (eight) hours as needed for nausea or vomiting. 11/20/14   Lovett Calender, PA-C  pantoprazole (PROTONIX) 20 MG tablet Take 20 mg by mouth daily.    Historical Provider, MD  QUEtiapine (SEROQUEL) 100 MG tablet Take 2 tablets (200 mg total) by mouth at bedtime. 11/22/14   Bonnielee Haff, MD  traMADol (ULTRAM) 50 MG tablet Take 1 tablet (50 mg total) by mouth every 6 (six) hours as needed. 11/22/14   Brittney Kelly, PA-C   BP 146/58 mmHg  Pulse 75  Temp(Src) 99.7 F (37.6 C) (Oral)  Resp 18  Ht 5\' 4"  (1.626 m)  Wt 185 lb (83.915 kg)  BMI 31.74 kg/m2  SpO2 93% Physical Exam  Constitutional: She is oriented to person, place, and time. She appears well-developed and well-nourished. No distress.  HENT:  Head: Normocephalic and atraumatic.  Mouth/Throat: Oropharynx is clear and moist.  Eyes: Conjunctivae and EOM are normal. Pupils are equal, round, and reactive to light.  Neck: Normal range of motion. Neck supple.  Cardiovascular: Normal rate, regular rhythm and intact distal pulses.   No murmur heard. Pulmonary/Chest: Effort normal and breath sounds normal. No respiratory distress. She has no wheezes. She has no rales.  Abdominal: Soft. She  exhibits no distension. There is no tenderness. There is no rebound and no guarding.  Musculoskeletal: Normal range of motion. She exhibits edema. She exhibits no tenderness.  Cast on the left lower extremity. Trace edema in bilateral lower extremities  Neurological: She is alert and oriented to person, place, and time.  Skin: Skin is warm and dry. No rash noted. No erythema.  Psychiatric: She has a normal mood and affect. Her behavior is normal.  Nursing note and vitals reviewed.   ED Course  Procedures (including critical care time) Labs Review Labs Reviewed  CBC WITH DIFFERENTIAL/PLATELET - Abnormal; Notable for the following:    RBC 2.50 (*)    Hemoglobin 7.3 (*)    HCT 23.1 (*)    RDW 16.9 (*)    All other components within normal limits  COMPREHENSIVE METABOLIC PANEL - Abnormal; Notable for the following:    Glucose, Bld 123 (*)    BUN 66 (*)    Creatinine, Ser 4.18 (*)    Albumin 2.5 (*)    ALT 12 (*)    GFR calc non Af Amer 10 (*)    GFR calc Af Amer 12 (*)    All other components within normal limits  CBG MONITORING, ED - Abnormal; Notable for the following:    Glucose-Capillary 137 (*)    All other components within normal limits  POC OCCULT BLOOD, ED    Imaging Review No results found.   EKG Interpretation None      MDM   Final diagnoses:  Anemia, unspecified anemia type  Chronic kidney disease, unspecified stage   patient with a history of coronary artery disease, history of blood transfusion, diabetes, hypertension who lives in a nursing facility and was brought by EMS today for a low hemoglobin. Nursing home states hemoglobin was 7. Patient had a hemoglobin done approximate 2 weeks ago which was 9.4 which is right before ORIF of a ankle fracture. On exam patient has no complaints. She denies shortness of breath, chest pain, nausea, vomiting, abdominal pain, black tarry stools.  Patient is not currently on dialysis and is currently being monitored by the  [place].   The nursing home also reported the patient was hyperglycemic at 240 however upon arrival here her blood sugar was 137.  Patient's exam without acute findings. Vital signs are within normal limits. CBC and CMP pending  2:56 PM Hemoglobin here is 7.3 however feel this is most likely a result of recent surgery. Hemoccult-negative. Also creatinine today is 4.18. She seems to range between 4.28-3.5. Patient has no complaints. At this time do not feel the patient needs a transfusion. She is currently on iron. Will discharge back to rehabilitation facility with repeat CBC and BMP on Monday or Tuesday.  Blanchie Dessert, MD 12/01/14 1457  Blanchie Dessert, MD 12/01/14 1459

## 2014-12-01 NOTE — ED Notes (Signed)
Pt denies any black or tarry stools, n/v/d or dizziness.

## 2014-12-09 DIAGNOSIS — F319 Bipolar disorder, unspecified: Secondary | ICD-10-CM | POA: Insufficient documentation

## 2014-12-09 DIAGNOSIS — D631 Anemia in chronic kidney disease: Secondary | ICD-10-CM

## 2014-12-09 DIAGNOSIS — IMO0001 Reserved for inherently not codable concepts without codable children: Secondary | ICD-10-CM | POA: Insufficient documentation

## 2014-12-09 DIAGNOSIS — E1122 Type 2 diabetes mellitus with diabetic chronic kidney disease: Secondary | ICD-10-CM | POA: Insufficient documentation

## 2014-12-09 DIAGNOSIS — N189 Chronic kidney disease, unspecified: Secondary | ICD-10-CM | POA: Insufficient documentation

## 2014-12-09 HISTORY — DX: Reserved for inherently not codable concepts without codable children: IMO0001

## 2014-12-09 HISTORY — DX: Chronic kidney disease, unspecified: D63.1

## 2014-12-09 HISTORY — DX: Bipolar disorder, unspecified: F31.9

## 2014-12-13 ENCOUNTER — Non-Acute Institutional Stay (SKILLED_NURSING_FACILITY): Payer: Medicare Other | Admitting: Nurse Practitioner

## 2014-12-13 DIAGNOSIS — N189 Chronic kidney disease, unspecified: Secondary | ICD-10-CM | POA: Diagnosis not present

## 2014-12-13 DIAGNOSIS — N184 Chronic kidney disease, stage 4 (severe): Secondary | ICD-10-CM | POA: Diagnosis not present

## 2014-12-13 DIAGNOSIS — F319 Bipolar disorder, unspecified: Secondary | ICD-10-CM | POA: Diagnosis not present

## 2014-12-13 DIAGNOSIS — D631 Anemia in chronic kidney disease: Secondary | ICD-10-CM

## 2014-12-13 DIAGNOSIS — S82892A Other fracture of left lower leg, initial encounter for closed fracture: Secondary | ICD-10-CM | POA: Diagnosis not present

## 2014-12-13 DIAGNOSIS — K219 Gastro-esophageal reflux disease without esophagitis: Secondary | ICD-10-CM | POA: Diagnosis not present

## 2014-12-13 DIAGNOSIS — E1142 Type 2 diabetes mellitus with diabetic polyneuropathy: Secondary | ICD-10-CM

## 2014-12-13 DIAGNOSIS — I509 Heart failure, unspecified: Secondary | ICD-10-CM

## 2014-12-13 DIAGNOSIS — IMO0001 Reserved for inherently not codable concepts without codable children: Secondary | ICD-10-CM

## 2014-12-13 DIAGNOSIS — I5042 Chronic combined systolic (congestive) and diastolic (congestive) heart failure: Secondary | ICD-10-CM | POA: Diagnosis not present

## 2014-12-13 DIAGNOSIS — I130 Hypertensive heart and chronic kidney disease with heart failure and stage 1 through stage 4 chronic kidney disease, or unspecified chronic kidney disease: Secondary | ICD-10-CM

## 2014-12-13 DIAGNOSIS — K5900 Constipation, unspecified: Secondary | ICD-10-CM

## 2014-12-14 DIAGNOSIS — I129 Hypertensive chronic kidney disease with stage 1 through stage 4 chronic kidney disease, or unspecified chronic kidney disease: Secondary | ICD-10-CM | POA: Diagnosis not present

## 2014-12-14 DIAGNOSIS — I509 Heart failure, unspecified: Secondary | ICD-10-CM | POA: Diagnosis not present

## 2014-12-14 DIAGNOSIS — I2510 Atherosclerotic heart disease of native coronary artery without angina pectoris: Secondary | ICD-10-CM | POA: Diagnosis not present

## 2014-12-14 DIAGNOSIS — S9305XD Dislocation of left ankle joint, subsequent encounter: Secondary | ICD-10-CM | POA: Diagnosis not present

## 2014-12-14 DIAGNOSIS — E119 Type 2 diabetes mellitus without complications: Secondary | ICD-10-CM | POA: Diagnosis not present

## 2014-12-14 DIAGNOSIS — S82852D Displaced trimalleolar fracture of left lower leg, subsequent encounter for closed fracture with routine healing: Secondary | ICD-10-CM | POA: Diagnosis not present

## 2014-12-19 NOTE — Progress Notes (Signed)
[name], female   DOB: 09/09/1950, 64 y.o.   MRN: [id number]    Nursing Home Location:  [place] of Service: SNF (31)  PCP: No primary care provider on file.  No Known Allergies  Chief Complaint  Patient presents with  . Discharge Note    HPI:  Patient is a 64 y.o. female seen today at [facility] for discharge home. Pt with a PMH of DM, HTN, CAD, CKD stage 4, bipolar disorder. Pt  here for short term rehabilitation post hospital admission from 11/17/14-11/22/14 with left ankle fracture. She underwent open reduction and internal fixation of her left ankle fracture on 11/20/14. Pt has been noncompliant with therapy and recs per ortho, therefore cast placed to thigh so she would not be weight bearing. Pt being discharged at her request and has family support at home.    Review of Systems:  Review of Systems  Constitutional: Negative for activity change, appetite change, fatigue and unexpected weight change.  HENT: Negative for congestion and hearing loss.   Eyes: Negative.   Respiratory: Negative for cough and shortness of breath.   Cardiovascular: Negative for chest pain, palpitations and leg swelling.  Gastrointestinal: Positive for constipation. Negative for abdominal pain and diarrhea.  Genitourinary: Negative for dysuria and difficulty urinating.  Musculoskeletal: Negative for myalgias and arthralgias.       Pain controlled on medications  Skin: Negative for color change and wound.  Neurological: Negative for dizziness and weakness.  Psychiatric/Behavioral: Negative for confusion and agitation.    Past Medical History  Diagnosis Date  . Coronary artery disease   . Bipolar disorder   . Anemia   . History of blood transfusion   . DM type 2, uncontrolled, with renal complications   . Benign hypertension    Past Surgical History  Procedure Laterality Date  . Cardiac catheterization      2 stent   . Av fistula  placement Left may-2016    done at [facility]  . Tonsillectomy    . Abdominal hysterectomy    . Cholecystectomy    . Ankle closed reduction N/A 11/17/2014    Procedure: CLOSED REDUCTION ANKLE;  Surgeon: Earlie Server, MD;  Location: [place];  Service: Orthopedics;  Laterality: N/A;  . Orif ankle fracture Left 11/20/2014    Procedure: OPEN REDUCTION INTERNAL FIXATION (ORIF) ANKLE FRACTURE;  Surgeon: Renette Butters, MD;  Location: [place];  Service: Orthopedics;  Laterality: Left;   Social History:   reports that she has been smoking.  She does not have any smokeless tobacco history on file. She reports that she does not drink alcohol or use illicit drugs.  No family history on file.  Medications: Patient's Medications  New Prescriptions   No medications on file  Previous Medications   ALBUTEROL (PROVENTIL HFA;VENTOLIN HFA) 108 (90 BASE) MCG/ACT INHALER    Inhale 1-2 puffs into the lungs every 6 (six) hours as needed for wheezing or shortness of breath.   AMLODIPINE (NORVASC) 10 MG TABLET    Take 10 mg by mouth daily.   ASPIRIN 325 MG TABLET    Take 1 tablet (325 mg total) by mouth daily.   BENZTROPINE (COGENTIN) 0.5 MG TABLET    Take 0.5 mg by mouth 2 (two) times daily.   BUMETANIDE (BUMEX) 2 MG TABLET    Take 2 mg by mouth daily.   CARVEDILOL (COREG) 25 MG TABLET  Take 25 mg by mouth 2 (two) times daily with a meal.   DARBEPOETIN (ARANESP) 40 MCG/0.4ML SOLN INJECTION    Inject 40 mcg into the skin.   DOCUSATE SODIUM (COLACE) 100 MG CAPSULE    Take 1 capsule (100 mg total) by mouth 2 (two) times daily.   FERROUS GLUCONATE (FERGON) 324 MG TABLET    Take 324 mg by mouth daily with breakfast.   FLUPHENAZINE (PROLIXIN) 1 MG TABLET    Take 1 mg by mouth daily.   GABAPENTIN (NEURONTIN) 300 MG CAPSULE    Take 300 mg by mouth 2 (two) times daily.   HYDRALAZINE (APRESOLINE) 100 MG TABLET    Take 100 mg by mouth 2 (two) times daily.   HYDROCODONE-ACETAMINOPHEN (NORCO/VICODIN) 5-325 MG PER TABLET     1 by mouth every 4-6 hours as needed DO NOT EXCEED 4 GM OF APAP IN 24 HOURS   INSULIN ASPART (NOVOLOG) 100 UNIT/ML INJECTION    Inject 7-15 Units into the skin 3 (three) times daily before meals.   INSULIN DETEMIR (LEVEMIR) 100 UNIT/ML INJECTION    Inject 0.12 mLs (12 Units total) into the skin at bedtime.   ISOSORBIDE MONONITRATE (IMDUR) 60 MG 24 HR TABLET    Take 60 mg by mouth daily.   MULTIPLE VITAMINS-MINERALS (MULTIVITAMIN WITH MINERALS) TABLET    Take 1 tablet by mouth daily.   ONDANSETRON (ZOFRAN) 4 MG TABLET    Take 1 tablet (4 mg total) by mouth every 8 (eight) hours as needed for nausea or vomiting.   PANTOPRAZOLE (PROTONIX) 20 MG TABLET    Take 20 mg by mouth daily.   QUETIAPINE (SEROQUEL) 100 MG TABLET    Take 2 tablets (200 mg total) by mouth at bedtime.   TRAMADOL (ULTRAM) 50 MG TABLET    Take 1 tablet (50 mg total) by mouth every 6 (six) hours as needed.  Modified Medications   No medications on file  Discontinued Medications   No medications on file     Physical Exam: Filed Vitals:   12/13/14 1026  BP: 140/66  Pulse: 80  Temp: 98.6 F (37 C)  Resp: 20    Physical Exam  Constitutional: She appears well-developed and well-nourished. No distress.  HENT:  Head: Normocephalic and atraumatic.  Mouth/Throat: Oropharynx is clear and moist. No oropharyngeal exudate.  Eyes: Conjunctivae are normal. Pupils are equal, round, and reactive to light.  Neck: Normal range of motion. Neck supple.  Cardiovascular: Normal rate, regular rhythm and normal heart sounds.   Pulmonary/Chest: Effort normal and breath sounds normal.  Abdominal: Soft. Bowel sounds are normal.  Musculoskeletal: She exhibits no edema or tenderness.  Left leg in cast  Neurological: She is alert.  Skin: Skin is warm and dry. She is not diaphoretic.  Psychiatric: She has a normal mood and affect.    Labs reviewed: Basic Metabolic Panel:  Recent Labs  11/17/14 1910  11/21/14 0745 11/22/14 0603  12/01/14 1305  NA 136  < > 138 139 136  K 3.7  < > 4.0 3.9 4.4  CL 105  < > 109 106 103  CO2 21*  < > 16* 20* 23  GLUCOSE 328*  < > 145* 112* 123*  BUN 44*  < > 41* 41* 66*  CREATININE 3.78*  < > 3.41* 3.59* 4.18*  CALCIUM 8.6*  < > 8.6* 9.0 9.1  MG 2.0  --   --   --   --   PHOS 3.9  --   --   --   --   < > =  values in this interval not displayed. Liver Function Tests:  Recent Labs  11/18/14 0433 11/19/14 0401 12/01/14 1305  AST 17 15 19   ALT 13* 13* 12*  ALKPHOS 71 65 79  BILITOT 0.5 0.5 0.6  PROT 6.0* 6.4* 6.9  ALBUMIN 2.6* 2.5* 2.5*   No results for input(s): LIPASE, AMYLASE in the last 8760 hours.  Recent Labs  11/18/14 1505  AMMONIA 18   CBC:  Recent Labs  11/17/14 1457 11/17/14 1910 12/01/14 1305  WBC  --  7.5 8.5  NEUTROABS  --   --  6.4  HGB 10.5* 9.4* 7.3*  HCT 31.0* 29.7* 23.1*  MCV  --  93.7 92.4  PLT  --  213 342   TSH:  Recent Labs  11/18/14 1505  TSH 1.591   A1C: Lab Results  Component Value Date   HGBA1C 8.3* 11/18/2014   Lipid Panel: No results for input(s): CHOL, HDL, LDLCALC, TRIG, CHOLHDL, LDLDIRECT in the last 8760 hours.  Assessment/Plan 1. Ankle fracture, left s/p ORIF. NWB to LLE.  follow up with orthopedics.  Continue aspirin 325 mg daily for dvt prophylaxis.  Cont tramadol 50 mg qid prn pain  2. Chronic combined systolic and diastolic CHF (congestive heart failure) Stable, euvolemic. Continue bumex 2 mg daily, coreg 25 mg bid, hydralazine 100 mg bid, imdur 60 mg daily.  3. Anemia in chronic kidney disease Cont on Aransep for hgb <10 and fergon daily   4. CKD (chronic kidney disease), stage 4 (severe) AV fistula, cont outpt follow up  5. Type 2 diabetes mellitus with diabetic polyneuropathy conts levemir 12 units and SSI with meals, will need further outpatient follow up conts gabapentin 300 mg BID  6. Hypertensive heart/renal disease with failure Stable, conts on amlodipine 10 mg daily, coreg 25 mg bid,  hydralazine 100 mg bid, imdur 60 mg daily   7. Gastroesophageal reflux disease, esophagitis presence not specified Stable, conts on protonix 20 mg daily   8. Constipation, unspecified constipation type conts on colace 100 mg BID, discussed adding Miralax daily if needed  9. Bipolar affective disorder, most recent episode unspecified type, remission status unspecified Mood stable, seroquel 200 mg daily, fluphenazine 1 mg daily and cogentin 0.1 mg daily   Pt seen examined on 12/13/14 pt is stable for discharge-will need PT/OT/Nursing per home health. No DME needed. Rx written.  will need to follow up with ortho and PCP within 2 weeks.   Carlos American. Harle Battiest  [facility] [phone] 8 am - 5 pm) [phone] (after hours)

## 2015-01-20 ENCOUNTER — Inpatient Hospital Stay (HOSPITAL_COMMUNITY)
Admission: EM | Admit: 2015-01-20 | Discharge: 2015-01-23 | DRG: 291 | Disposition: A | Discharge status: Home Health | Payer: Medicare Other | Attending: Internal Medicine | Admitting: Internal Medicine

## 2015-01-20 ENCOUNTER — Encounter (HOSPITAL_COMMUNITY): Payer: Self-pay | Admitting: Emergency Medicine

## 2015-01-20 ENCOUNTER — Emergency Department (HOSPITAL_COMMUNITY): Payer: Medicare Other

## 2015-01-20 DIAGNOSIS — Z9114 Patient's other noncompliance with medication regimen: Secondary | ICD-10-CM | POA: Diagnosis present

## 2015-01-20 DIAGNOSIS — I10 Essential (primary) hypertension: Secondary | ICD-10-CM | POA: Diagnosis present

## 2015-01-20 DIAGNOSIS — D649 Anemia, unspecified: Secondary | ICD-10-CM | POA: Diagnosis present

## 2015-01-20 DIAGNOSIS — Z955 Presence of coronary angioplasty implant and graft: Secondary | ICD-10-CM

## 2015-01-20 DIAGNOSIS — F17210 Nicotine dependence, cigarettes, uncomplicated: Secondary | ICD-10-CM | POA: Diagnosis present

## 2015-01-20 DIAGNOSIS — E1122 Type 2 diabetes mellitus with diabetic chronic kidney disease: Secondary | ICD-10-CM | POA: Diagnosis present

## 2015-01-20 DIAGNOSIS — D631 Anemia in chronic kidney disease: Secondary | ICD-10-CM | POA: Diagnosis present

## 2015-01-20 DIAGNOSIS — R609 Edema, unspecified: Secondary | ICD-10-CM

## 2015-01-20 DIAGNOSIS — Z79899 Other long term (current) drug therapy: Secondary | ICD-10-CM

## 2015-01-20 DIAGNOSIS — N184 Chronic kidney disease, stage 4 (severe): Secondary | ICD-10-CM | POA: Diagnosis present

## 2015-01-20 DIAGNOSIS — R296 Repeated falls: Secondary | ICD-10-CM | POA: Diagnosis present

## 2015-01-20 DIAGNOSIS — Z7982 Long term (current) use of aspirin: Secondary | ICD-10-CM

## 2015-01-20 DIAGNOSIS — I152 Hypertension secondary to endocrine disorders: Secondary | ICD-10-CM | POA: Diagnosis present

## 2015-01-20 DIAGNOSIS — I5033 Acute on chronic diastolic (congestive) heart failure: Secondary | ICD-10-CM | POA: Diagnosis not present

## 2015-01-20 DIAGNOSIS — Z794 Long term (current) use of insulin: Secondary | ICD-10-CM

## 2015-01-20 DIAGNOSIS — E1165 Type 2 diabetes mellitus with hyperglycemia: Secondary | ICD-10-CM | POA: Diagnosis present

## 2015-01-20 DIAGNOSIS — I472 Ventricular tachycardia, unspecified: Secondary | ICD-10-CM

## 2015-01-20 DIAGNOSIS — Z885 Allergy status to narcotic agent status: Secondary | ICD-10-CM

## 2015-01-20 DIAGNOSIS — N189 Chronic kidney disease, unspecified: Secondary | ICD-10-CM

## 2015-01-20 DIAGNOSIS — I2510 Atherosclerotic heart disease of native coronary artery without angina pectoris: Secondary | ICD-10-CM | POA: Diagnosis present

## 2015-01-20 DIAGNOSIS — M14672 Charcot's joint, left ankle and foot: Secondary | ICD-10-CM

## 2015-01-20 DIAGNOSIS — E119 Type 2 diabetes mellitus without complications: Secondary | ICD-10-CM | POA: Diagnosis present

## 2015-01-20 DIAGNOSIS — Z833 Family history of diabetes mellitus: Secondary | ICD-10-CM

## 2015-01-20 DIAGNOSIS — I509 Heart failure, unspecified: Secondary | ICD-10-CM

## 2015-01-20 DIAGNOSIS — R410 Disorientation, unspecified: Secondary | ICD-10-CM | POA: Diagnosis not present

## 2015-01-20 DIAGNOSIS — G9340 Encephalopathy, unspecified: Secondary | ICD-10-CM | POA: Diagnosis present

## 2015-01-20 DIAGNOSIS — I4729 Other ventricular tachycardia: Secondary | ICD-10-CM | POA: Insufficient documentation

## 2015-01-20 DIAGNOSIS — I129 Hypertensive chronic kidney disease with stage 1 through stage 4 chronic kidney disease, or unspecified chronic kidney disease: Secondary | ICD-10-CM | POA: Diagnosis present

## 2015-01-20 DIAGNOSIS — E11610 Type 2 diabetes mellitus with diabetic neuropathic arthropathy: Secondary | ICD-10-CM | POA: Diagnosis present

## 2015-01-20 DIAGNOSIS — W06XXXA Fall from bed, initial encounter: Secondary | ICD-10-CM | POA: Diagnosis present

## 2015-01-20 DIAGNOSIS — F319 Bipolar disorder, unspecified: Secondary | ICD-10-CM | POA: Diagnosis present

## 2015-01-20 LAB — COMPREHENSIVE METABOLIC PANEL
ALBUMIN: 3.2 g/dL — AB (ref 3.5–5.0)
ALT: 54 U/L (ref 14–54)
AST: 47 U/L — ABNORMAL HIGH (ref 15–41)
Alkaline Phosphatase: 106 U/L (ref 38–126)
Anion gap: 9 (ref 5–15)
BUN: 42 mg/dL — ABNORMAL HIGH (ref 6–20)
CO2: 21 mmol/L — ABNORMAL LOW (ref 22–32)
Calcium: 8.8 mg/dL — ABNORMAL LOW (ref 8.9–10.3)
Chloride: 111 mmol/L (ref 101–111)
Creatinine, Ser: 3.56 mg/dL — ABNORMAL HIGH (ref 0.44–1.00)
GFR calc non Af Amer: 13 mL/min — ABNORMAL LOW (ref >60)
GFR, EST AFRICAN AMERICAN: 15 mL/min — AB (ref >60)
GLUCOSE: 110 mg/dL — AB (ref 65–99)
POTASSIUM: 3.9 mmol/L (ref 3.5–5.1)
SODIUM: 141 mmol/L (ref 135–145)
TOTAL PROTEIN: 6.4 g/dL — AB (ref 6.5–8.1)
Total Bilirubin: 0.7 mg/dL (ref 0.3–1.2)

## 2015-01-20 LAB — CBC WITH DIFFERENTIAL/PLATELET
BASOS PCT: 0 % (ref 0–1)
Basophils Absolute: 0 10*3/uL (ref 0.0–0.1)
EOS ABS: 0.1 10*3/uL (ref 0.0–0.7)
Eosinophils Relative: 2 % (ref 0–5)
HCT: 24.1 % — ABNORMAL LOW (ref 36.0–46.0)
Hemoglobin: 7.6 g/dL — ABNORMAL LOW (ref 12.0–15.0)
Lymphocytes Relative: 17 % (ref 12–46)
Lymphs Abs: 1.3 10*3/uL (ref 0.7–4.0)
MCH: 29.1 pg (ref 26.0–34.0)
MCHC: 31.5 g/dL (ref 30.0–36.0)
MCV: 92.3 fL (ref 78.0–100.0)
MONOS PCT: 9 % (ref 3–12)
Monocytes Absolute: 0.7 10*3/uL (ref 0.1–1.0)
Neutro Abs: 5.7 10*3/uL (ref 1.7–7.7)
Neutrophils Relative %: 72 % (ref 43–77)
Platelets: 269 10*3/uL (ref 150–400)
RBC: 2.61 MIL/uL — ABNORMAL LOW (ref 3.87–5.11)
RDW: 18.4 % — ABNORMAL HIGH (ref 11.5–15.5)
WBC: 7.8 10*3/uL (ref 4.0–10.5)

## 2015-01-20 NOTE — ED Provider Notes (Signed)
CSN: [id number]     Arrival date & time 01/20/15  2119 History   This chart was scribed for Rolland Porter, MD by Forrestine Him, ED Scribe. This patient was seen in room A04C/A04C and the patient's care was started 11:11 PM.   Chief Complaint  Patient presents with  . Altered Mental Status   The history is provided by the patient and a relative. No language interpreter was used.    HPI Comments: Robin [name] is a 64 y.o. female with a PMHx of CAD, bipolar disorder, and DM who presents to the Emergency Department here for altered mental status this evening. Daughter reports intermittent confusion, mild facial swelling and some bruising without pain, and feeling disoriented x 1 week. Pt also reports sore throat, coughing, rhinorrhea, fatigue, shortness of breath with rest and extertion, and bilateral leg swelling. Pts daughter suspects Robin [name] may have taken a few tabs of Oxycodone in last few days. States she shouldn't be taking this medication because it has made her have altered mental status before, however, mail order arrived to her home before daughter could get to the mail. No recent fever or abdominal pain. Robin [name] recently underwent an ankle closed reduction and a few days later an onif ankle fracture performed by Dr. Earlie Server 11/17/2014 and Dr Percell Miller on  11/20/2014. She is having delayed healing per her daughter. Pt lives alone, but family comes every day to monitor her. Pt with known allergy to Oxycodone.  She is followed by [facility] Orth[facility] Dr Percell Miller  Past Medical History  Diagnosis Date  . Coronary artery disease   . Bipolar disorder   . Anemia   . History of blood transfusion   . DM type 2, uncontrolled, with renal complications   . Benign hypertension    Past Surgical History  Procedure Laterality Date  . Cardiac catheterization      2 stent   . Av fistula placement Left may-2016    done at [facility]  . Tonsillectomy    . Abdominal hysterectomy    . Cholecystectomy     . Ankle closed reduction N/A 11/17/2014    Procedure: CLOSED REDUCTION ANKLE;  Surgeon: Earlie Server, MD;  Location: [place];  Service: Orthopedics;  Laterality: N/A;  . Orif ankle fracture Left 11/20/2014    Procedure: OPEN REDUCTION INTERNAL FIXATION (ORIF) ANKLE FRACTURE;  Surgeon: Renette Butters, MD;  Location: [place];  Service: Orthopedics;  Laterality: Left;  . Coronary stent placement     No family history on file. Social History  Substance Use Topics  . Smoking status: Current Every Day Smoker -- 0.00 packs/day    Types: Cigarettes  . Smokeless tobacco: None  . Alcohol Use: No  lives alone Lives at home Uses a wheelchair and walker  Retired Corporate treasurer for 11 years  OB History    No data available     Review of Systems  Constitutional: Positive for fatigue. Negative for fever and chills.  HENT: Positive for facial swelling, rhinorrhea and sore throat.   Respiratory: Positive for cough and shortness of breath.   Cardiovascular: Negative for chest pain.  Gastrointestinal: Negative for nausea, vomiting and abdominal pain.  Neurological: Negative for weakness and numbness.  Psychiatric/Behavioral: Positive for confusion.  All other systems reviewed and are negative.     Allergies  Oxycodone  Home Medications   Prior to Admission medications   Medication Sig Start Date End Date Taking? Authorizing Provider  albuterol (PROVENTIL HFA;VENTOLIN HFA)  108 (90 BASE) MCG/ACT inhaler Inhale 1-2 puffs into the lungs every 6 (six) hours as needed for wheezing or shortness of breath.   Yes Historical Provider, MD  amLODipine (NORVASC) 10 MG tablet Take 10 mg by mouth daily.   Yes Historical Provider, MD  aspirin 325 MG tablet Take 1 tablet (325 mg total) by mouth daily. 11/20/14  Yes Brittney Claiborne Billings, PA-C  benztropine (COGENTIN) 0.5 MG tablet Take 0.5 mg by mouth 2 (two) times daily.   Yes Historical Provider, MD  bumetanide (BUMEX) 2 MG tablet Take 2 mg by mouth daily.   Yes Historical  Provider, MD  carvedilol (COREG) 25 MG tablet Take 25 mg by mouth 2 (two) times daily with a meal.   Yes Historical Provider, MD  darbepoetin (ARANESP) 40 MCG/0.4ML SOLN injection Inject 40 mcg into the skin every 14 (fourteen) days.    Yes Historical Provider, MD  docusate sodium (COLACE) 100 MG capsule Take 1 capsule (100 mg total) by mouth 2 (two) times daily. 11/20/14  Yes Brittney Claiborne Billings, PA-C  ferrous gluconate (FERGON) 324 MG tablet Take 324 mg by mouth daily with breakfast.   Yes Historical Provider, MD  fluPHENAZine (PROLIXIN) 1 MG tablet Take 1 mg by mouth daily.   Yes Historical Provider, MD  gabapentin (NEURONTIN) 300 MG capsule Take 300 mg by mouth 2 (two) times daily.   Yes Historical Provider, MD  hydrALAZINE (APRESOLINE) 100 MG tablet Take 100 mg by mouth 2 (two) times daily.   Yes Historical Provider, MD  HYDROcodone-acetaminophen (NORCO/VICODIN) 5-325 MG per tablet 1 by mouth every 4-6 hours as needed DO NOT EXCEED 4 GM OF APAP IN 24 HOURS 12/01/14  Yes Gildardo Cranker, DO  insulin aspart (NOVOLOG) 100 UNIT/ML injection Inject 7-15 Units into the skin 3 (three) times daily before meals. Sliding scale   Yes Historical Provider, MD  insulin detemir (LEVEMIR) 100 UNIT/ML injection Inject 0.12 mLs (12 Units total) into the skin at bedtime. Patient taking differently: Inject 20-40 Units into the skin at bedtime. Takes 40 units in am, 20 units in pm 11/22/14  Yes Bonnielee Haff, MD  isosorbide mononitrate (IMDUR) 60 MG 24 hr tablet Take 60 mg by mouth daily.   Yes Historical Provider, MD  Multiple Vitamins-Minerals (MULTIVITAMIN WITH MINERALS) tablet Take 1 tablet by mouth daily.   Yes Historical Provider, MD  oxyCODONE-acetaminophen (PERCOCET/ROXICET) 5-325 MG per tablet Take 1 tablet by mouth every 4 (four) hours as needed for severe pain.   Yes Historical Provider, MD  pantoprazole (PROTONIX) 20 MG tablet Take 20 mg by mouth daily.   Yes Historical Provider, MD  QUEtiapine (SEROQUEL) 100 MG  tablet Take 2 tablets (200 mg total) by mouth at bedtime. 11/22/14  Yes Bonnielee Haff, MD  ondansetron (ZOFRAN) 4 MG tablet Take 1 tablet (4 mg total) by mouth every 8 (eight) hours as needed for nausea or vomiting. 11/20/14   Lovett Calender, PA-C  traMADol (ULTRAM) 50 MG tablet Take 1 tablet (50 mg total) by mouth every 6 (six) hours as needed. 11/22/14   Lovett Calender, PA-C   Triage Vitals: BP 149/67 mmHg  Pulse 80  Temp(Src) 99.1 F (37.3 C) (Oral)  Resp 15  SpO2 96%  Vital signs normal     Physical Exam  Constitutional: She is oriented to person, place, and time. She appears well-developed and well-nourished.  Non-toxic appearance. She does not appear ill. No distress.  HENT:  Head: Normocephalic and atraumatic.  Right Ear: External ear normal.  Left  Ear: External ear normal.  Nose: Nose normal. No mucosal edema or rhinorrhea.  Mouth/Throat: Oropharynx is clear and moist and mucous membranes are normal. No dental abscesses or uvula swelling.  No obvious tenderness to head,  but daughter states face has been swollen and bruised   Eyes: Conjunctivae and EOM are normal. Pupils are equal, round, and reactive to light.  Neck: Normal range of motion and full passive range of motion without pain. Neck supple.  Cardiovascular: Normal rate, regular rhythm and normal heart sounds.  Exam reveals no gallop and no friction rub.   No murmur heard. Pulmonary/Chest: Effort normal and breath sounds normal. No respiratory distress. She has no wheezes. She has no rhonchi. She has no rales. She exhibits no tenderness and no crepitus.  Course breath sounds initially, but when pt coughed, breath sounds were clear   Abdominal: Soft. Normal appearance and bowel sounds are normal. She exhibits no distension. There is no tenderness. There is no rebound and no guarding.  Musculoskeletal: Normal range of motion. She exhibits edema. She exhibits no tenderness.  Moves all extremities well.  1 plus pitting edema  of RLE Cam walker noted to L lower leg  Neurological: She is alert and oriented to person, place, and time. She has normal strength. No cranial nerve deficit.  Skin: Skin is warm, dry and intact. No rash noted. No erythema. No pallor.  Psychiatric: She has a normal mood and affect. Her speech is normal and behavior is normal. Her mood appears not anxious.  Nursing note and vitals reviewed.   ED Course  Procedures (including critical care time)  Medications  bumetanide (BUMEX) injection 1 mg (1 mg Intravenous Given 01/21/15 0257)     DIAGNOSTIC STUDIES: Oxygen Saturation is 97% on RA, adequate by my interpretation.    COORDINATION OF CARE: 11:24 PM- Will order CT maxillofacial without CM, CT head without contrast, CXR, urine rapid drug screen, BNP, Troponin I, urinalysis, CBC, and CMP. Discussed treatment plan with pt at bedside and pt agreed to plan.    2:28 AM- Updated pt and family of results. Pt is resting comfortably at this time.  Patient was unable to give urine sample, finally nurses did in and out cath.  5:45 AM patient has not had much response from the Bumex. At 5:30 AM the secretary noted a 11 beat run of V. tach. Patient states that she did feel a little short of breath and back for short period of time. Patient is agreeable to being admitted.  06:06 Dr Blaine Hamper, admit to step down    June 2016 Echocardiogram Study Conclusions  - Left ventricle: The cavity size was normal. Wall thickness was increased in a pattern of moderate LVH. Systolic function was normal. The estimated ejection fraction was in the range of 55% to 60%. Wall motion was normal; there were no regional wall motion abnormalities. Features are consistent with a pseudonormal left ventricular filling pattern, with concomitant abnormal relaxation and increased filling pressure (grade 2 diastolic dysfunction). - Aortic valve: Mildly calcified annulus. Trileaflet; mildly thickened leaflets.  Valve area (VTI): 2.22 cm^2. Valve area (Vmax): 2.18 cm^2. - Mitral valve: Mildly calcified annulus. Mildly thickened leaflets . - Left atrium: The atrium was mildly dilated. - Technically adequate study.  Labs Review Results for orders placed or performed during the hospital encounter of 01/20/15  CBC with Differential  Result Value Ref Range   WBC 7.8 4.0 - 10.5 K/uL   RBC 2.61 (L) 3.87 - 5.11 MIL/uL  Hemoglobin 7.6 (L) 12.0 - 15.0 g/dL   HCT 24.1 (L) 36.0 - 46.0 %   MCV 92.3 78.0 - 100.0 fL   MCH 29.1 26.0 - 34.0 pg   MCHC 31.5 30.0 - 36.0 g/dL   RDW 18.4 (H) 11.5 - 15.5 %   Platelets 269 150 - 400 K/uL   Neutrophils Relative % 72 43 - 77 %   Neutro Abs 5.7 1.7 - 7.7 K/uL   Lymphocytes Relative 17 12 - 46 %   Lymphs Abs 1.3 0.7 - 4.0 K/uL   Monocytes Relative 9 3 - 12 %   Monocytes Absolute 0.7 0.1 - 1.0 K/uL   Eosinophils Relative 2 0 - 5 %   Eosinophils Absolute 0.1 0.0 - 0.7 K/uL   Basophils Relative 0 0 - 1 %   Basophils Absolute 0.0 0.0 - 0.1 K/uL  Comprehensive metabolic panel  Result Value Ref Range   Sodium 141 135 - 145 mmol/L   Potassium 3.9 3.5 - 5.1 mmol/L   Chloride 111 101 - 111 mmol/L   CO2 21 (L) 22 - 32 mmol/L   Glucose, Bld 110 (H) 65 - 99 mg/dL   BUN 42 (H) 6 - 20 mg/dL   Creatinine, Ser 3.56 (H) 0.44 - 1.00 mg/dL   Calcium 8.8 (L) 8.9 - 10.3 mg/dL   Total Protein 6.4 (L) 6.5 - 8.1 g/dL   Albumin 3.2 (L) 3.5 - 5.0 g/dL   AST 47 (H) 15 - 41 U/L   ALT 54 14 - 54 U/L   Alkaline Phosphatase 106 38 - 126 U/L   Total Bilirubin 0.7 0.3 - 1.2 mg/dL   GFR calc non Af Amer 13 (L) >60 mL/min   GFR calc Af Amer 15 (L) >60 mL/min   Anion gap 9 5 - 15  Brain natriuretic peptide  Result Value Ref Range   B Natriuretic Peptide 1500.3 (H) 0.0 - 100.0 pg/mL  Troponin I  Result Value Ref Range   Troponin I 0.04 (H) <0.031 ng/mL  Urinalysis, Routine w reflex microscopic (not at [facility])  Result Value Ref Range   Color, Urine YELLOW YELLOW   APPearance  CLEAR CLEAR   Specific Gravity, Urine 1.015 1.005 - 1.030   pH 5.0 5.0 - 8.0   Glucose, UA NEGATIVE NEGATIVE mg/dL   Hgb urine dipstick NEGATIVE NEGATIVE   Bilirubin Urine NEGATIVE NEGATIVE   Ketones, ur NEGATIVE NEGATIVE mg/dL   Protein, ur 100 (A) NEGATIVE mg/dL   Urobilinogen, UA 0.2 0.0 - 1.0 mg/dL   Nitrite NEGATIVE NEGATIVE   Leukocytes, UA NEGATIVE NEGATIVE  Urine rapid drug screen (hosp performed)  Result Value Ref Range   Opiates NONE DETECTED NONE DETECTED   Cocaine NONE DETECTED NONE DETECTED   Benzodiazepines NONE DETECTED NONE DETECTED   Amphetamines NONE DETECTED NONE DETECTED   Tetrahydrocannabinol NONE DETECTED NONE DETECTED   Barbiturates NONE DETECTED NONE DETECTED  Urine microscopic-add on  Result Value Ref Range   Squamous Epithelial / LPF RARE RARE   WBC, UA 0-2 <3 WBC/hpf   RBC / HPF 0-2 <3 RBC/hpf   Casts HYALINE CASTS (A) NEGATIVE   Urine-Other LESS THAN 10 mL OF URINE SUBMITTED   CBG monitoring, ED  Result Value Ref Range   Glucose-Capillary 134 (H) 65 - 99 mg/dL   Laboratory interpretation all normal except some progressive worsening of anemia in the past 2 months, stable renal failure, BNP now 3 times as high as prior   Imaging Review  Ct  Head Wo Contrast Ct Maxillofacial Wo Cm  01/21/2015   CLINICAL DATA:  Disoriented and altered mental status, intermittent confusion for 1 week with facial swelling and leg swelling. History of diabetes, hypertension.  EXAM: CT HEAD WITHOUT CONTRAST  CT MAXILLOFACIAL WITHOUT CONTRAST  TECHNIQUE: Multidetector CT imaging of the head and maxillofacial structures were performed using the standard protocol without intravenous contrast. Multiplanar CT image reconstructions of the maxillofacial structures were also generated.  COMPARISON:  CT head November 18, 2014  FINDINGS: CT HEAD FINDINGS  Stable moderate ventriculomegaly, with commensurate enlargement of cerebral sulci and cerebellar folia. No intraparenchymal hemorrhage,  mass effect nor midline shift. Mild supratentorial white matter hypodensities are within normal range for patient's age and though non-specific suggest sequelae of chronic small vessel ischemic disease. No acute large vascular territory infarcts.  No abnormal extra-axial fluid collections. Basal cisterns are patent. Moderate to severe calcific atherosclerosis of the carotid siphons, to lesser extent included vertebral arteries. No skull fracture.  CT MAXILLOFACIAL FINDINGS  The mandible is intact. The condyles are located. No acute facial fracture. Torus palatini. Multiple absent teeth. Nasal septum is midline. Paranasal sinuses and mastoid air cells are well aerated. No destructive bony lesions.  Ocular globes intact, lenses are located. Normal appearance optic nerve sheath complexes. Normal appearance the extraocular muscles. Preservation orbital fat.  Moderate calcific atherosclerosis of the LEFT greater the RIGHT carotid bulbs. Mild subcutaneous fat inflammation without focal hematoma, focal fluid collection or subcutaneous gas.  IMPRESSION: CT HEAD: No acute intracranial process.  Stable appearance of the head including moderate parenchymal brain volume loss, advanced for age.  CT MAXILLOFACIAL: No acute facial fracture.  Mild subcutaneous fat stranding can be seen with cellulitis or inflammation.   Electronically Signed   By: Elon Alas M.D.   On: 01/21/2015 00:32   Dg Chest Port 1 View  01/21/2015   CLINICAL DATA:  Shortness of breath  EXAM: PORTABLE CHEST - 1 VIEW  COMPARISON:  11/17/2014  FINDINGS: Cardiac enlargement with mild pulmonary vascular congestion. Hazy perihilar infiltration suggests perihilar edema. No blunting of costophrenic angles. No pneumothorax. Mediastinal contours appear intact.  IMPRESSION: Cardiac enlargement with mild pulmonary vascular congestion and perihilar edema.   Electronically Signed   By: Lucienne Capers M.D.   On: 01/21/2015 00:35     I have personally  reviewed and evaluated these images and lab results as part of my medical decision-making.   EKG Interpretation   Date/Time:  Saturday January 20 2015 23:34:25 EDT Ventricular Rate:  83 PR Interval:  152 QRS Duration: 76 QT Interval:  456 QTC Calculation: 536 R Axis:   32 Text Interpretation:  Sinus rhythm Ventricular premature complex Probable  anterior infarct, age indeterminate Prolonged QT interval No significant  change since last tracing 17 Nov 2014 Confirmed by Zeplin Aleshire  MD-I, Trishna Cwik  ([id number]) on 01/20/2015 11:42:38 PM      MDM   Final diagnoses:  Chronic renal insufficiency, unspecified stage  Peripheral edema  Congestive heart failure, unspecified congestive heart failure chronicity, unspecified congestive heart failure type  Ventricular tachycardia  Confusion    Plan admissin  Rolland Porter, MD, FACEP    I personally performed the services described in this documentation, which was scribed in my presence. The recorded information has been reviewed and considered.  Rolland Porter, MD, Barbette Or, MD 01/21/15 [phone]

## 2015-01-20 NOTE — ED Notes (Signed)
Pt.'s daughter reported intermittent confusion/disoriented onset this week with facial swelling / lower legs swelling and fatigue . Denies SOB , no fever or chills.

## 2015-01-21 ENCOUNTER — Encounter (HOSPITAL_COMMUNITY): Payer: Self-pay

## 2015-01-21 DIAGNOSIS — R410 Disorientation, unspecified: Secondary | ICD-10-CM

## 2015-01-21 DIAGNOSIS — I472 Ventricular tachycardia: Secondary | ICD-10-CM

## 2015-01-21 DIAGNOSIS — Z9114 Patient's other noncompliance with medication regimen: Secondary | ICD-10-CM | POA: Diagnosis present

## 2015-01-21 DIAGNOSIS — Z833 Family history of diabetes mellitus: Secondary | ICD-10-CM | POA: Diagnosis not present

## 2015-01-21 DIAGNOSIS — E1129 Type 2 diabetes mellitus with other diabetic kidney complication: Secondary | ICD-10-CM | POA: Diagnosis not present

## 2015-01-21 DIAGNOSIS — E1165 Type 2 diabetes mellitus with hyperglycemia: Secondary | ICD-10-CM

## 2015-01-21 DIAGNOSIS — Z885 Allergy status to narcotic agent status: Secondary | ICD-10-CM | POA: Diagnosis not present

## 2015-01-21 DIAGNOSIS — N189 Chronic kidney disease, unspecified: Secondary | ICD-10-CM

## 2015-01-21 DIAGNOSIS — I5033 Acute on chronic diastolic (congestive) heart failure: Secondary | ICD-10-CM | POA: Diagnosis present

## 2015-01-21 DIAGNOSIS — R296 Repeated falls: Secondary | ICD-10-CM

## 2015-01-21 DIAGNOSIS — W06XXXA Fall from bed, initial encounter: Secondary | ICD-10-CM | POA: Diagnosis present

## 2015-01-21 DIAGNOSIS — E1122 Type 2 diabetes mellitus with diabetic chronic kidney disease: Secondary | ICD-10-CM | POA: Diagnosis present

## 2015-01-21 DIAGNOSIS — N184 Chronic kidney disease, stage 4 (severe): Secondary | ICD-10-CM | POA: Insufficient documentation

## 2015-01-21 DIAGNOSIS — I10 Essential (primary) hypertension: Secondary | ICD-10-CM

## 2015-01-21 DIAGNOSIS — F319 Bipolar disorder, unspecified: Secondary | ICD-10-CM | POA: Diagnosis present

## 2015-01-21 DIAGNOSIS — G9340 Encephalopathy, unspecified: Secondary | ICD-10-CM

## 2015-01-21 DIAGNOSIS — D631 Anemia in chronic kidney disease: Secondary | ICD-10-CM | POA: Diagnosis present

## 2015-01-21 DIAGNOSIS — I4729 Other ventricular tachycardia: Secondary | ICD-10-CM | POA: Insufficient documentation

## 2015-01-21 DIAGNOSIS — Z7982 Long term (current) use of aspirin: Secondary | ICD-10-CM | POA: Diagnosis not present

## 2015-01-21 DIAGNOSIS — E11610 Type 2 diabetes mellitus with diabetic neuropathic arthropathy: Secondary | ICD-10-CM | POA: Diagnosis present

## 2015-01-21 DIAGNOSIS — I129 Hypertensive chronic kidney disease with stage 1 through stage 4 chronic kidney disease, or unspecified chronic kidney disease: Secondary | ICD-10-CM | POA: Diagnosis present

## 2015-01-21 DIAGNOSIS — Z79899 Other long term (current) drug therapy: Secondary | ICD-10-CM | POA: Diagnosis not present

## 2015-01-21 DIAGNOSIS — D649 Anemia, unspecified: Secondary | ICD-10-CM | POA: Diagnosis present

## 2015-01-21 DIAGNOSIS — Z794 Long term (current) use of insulin: Secondary | ICD-10-CM | POA: Diagnosis not present

## 2015-01-21 DIAGNOSIS — Z955 Presence of coronary angioplasty implant and graft: Secondary | ICD-10-CM | POA: Diagnosis not present

## 2015-01-21 DIAGNOSIS — I2510 Atherosclerotic heart disease of native coronary artery without angina pectoris: Secondary | ICD-10-CM | POA: Diagnosis present

## 2015-01-21 DIAGNOSIS — I5043 Acute on chronic combined systolic (congestive) and diastolic (congestive) heart failure: Secondary | ICD-10-CM | POA: Insufficient documentation

## 2015-01-21 DIAGNOSIS — F17210 Nicotine dependence, cigarettes, uncomplicated: Secondary | ICD-10-CM | POA: Diagnosis present

## 2015-01-21 HISTORY — DX: Disorientation, unspecified: R41.0

## 2015-01-21 HISTORY — DX: Repeated falls: R29.6

## 2015-01-21 LAB — BRAIN NATRIURETIC PEPTIDE: B NATRIURETIC PEPTIDE 5: 1500.3 pg/mL — AB (ref 0.0–100.0)

## 2015-01-21 LAB — GLUCOSE, CAPILLARY
GLUCOSE-CAPILLARY: 141 mg/dL — AB (ref 65–99)
GLUCOSE-CAPILLARY: 301 mg/dL — AB (ref 65–99)
Glucose-Capillary: 106 mg/dL — ABNORMAL HIGH (ref 65–99)
Glucose-Capillary: 236 mg/dL — ABNORMAL HIGH (ref 65–99)

## 2015-01-21 LAB — RAPID URINE DRUG SCREEN, HOSP PERFORMED
Amphetamines: NOT DETECTED
Barbiturates: NOT DETECTED
Benzodiazepines: NOT DETECTED
Cocaine: NOT DETECTED
OPIATES: NOT DETECTED
Tetrahydrocannabinol: NOT DETECTED

## 2015-01-21 LAB — TSH: TSH: 1.018 u[IU]/mL (ref 0.350–4.500)

## 2015-01-21 LAB — URINALYSIS, ROUTINE W REFLEX MICROSCOPIC
BILIRUBIN URINE: NEGATIVE
Glucose, UA: NEGATIVE mg/dL
Hgb urine dipstick: NEGATIVE
Ketones, ur: NEGATIVE mg/dL
Leukocytes, UA: NEGATIVE
Nitrite: NEGATIVE
PROTEIN: 100 mg/dL — AB
Specific Gravity, Urine: 1.015 (ref 1.005–1.030)
UROBILINOGEN UA: 0.2 mg/dL (ref 0.0–1.0)
pH: 5 (ref 5.0–8.0)

## 2015-01-21 LAB — IRON AND TIBC
Iron: 29 ug/dL (ref 28–170)
Saturation Ratios: 14 % (ref 10.4–31.8)
TIBC: 211 ug/dL — ABNORMAL LOW (ref 250–450)
UIBC: 182 ug/dL

## 2015-01-21 LAB — TROPONIN I
Troponin I: 0.04 ng/mL — ABNORMAL HIGH (ref <0.031)
Troponin I: 0.03 ng/mL (ref <0.031)
Troponin I: 0.03 ng/mL

## 2015-01-21 LAB — URINE MICROSCOPIC-ADD ON

## 2015-01-21 LAB — VITAMIN B12: Vitamin B-12: 1245 pg/mL — ABNORMAL HIGH (ref 180–914)

## 2015-01-21 LAB — FERRITIN: Ferritin: 135 ng/mL (ref 11–307)

## 2015-01-21 LAB — ABO/RH: ABO/RH(D): O POS

## 2015-01-21 LAB — MRSA PCR SCREENING: MRSA BY PCR: NEGATIVE

## 2015-01-21 LAB — CBG MONITORING, ED: Glucose-Capillary: 134 mg/dL — ABNORMAL HIGH (ref 65–99)

## 2015-01-21 LAB — MAGNESIUM: Magnesium: 2.1 mg/dL (ref 1.7–2.4)

## 2015-01-21 MED ORDER — GABAPENTIN 300 MG PO CAPS
300.0000 mg | ORAL_CAPSULE | Freq: Two times a day (BID) | ORAL | Status: DC
  Administered 2015-01-21 – 2015-01-23 (×5): 300 mg via ORAL
  Filled 2015-01-21 (×5): qty 1

## 2015-01-21 MED ORDER — POTASSIUM CHLORIDE CRYS ER 10 MEQ PO TBCR
10.0000 meq | EXTENDED_RELEASE_TABLET | Freq: Every day | ORAL | Status: DC
  Administered 2015-01-21 – 2015-01-23 (×3): 10 meq via ORAL
  Filled 2015-01-21 (×2): qty 1

## 2015-01-21 MED ORDER — CARVEDILOL 25 MG PO TABS
25.0000 mg | ORAL_TABLET | Freq: Two times a day (BID) | ORAL | Status: DC
  Administered 2015-01-21 – 2015-01-23 (×5): 25 mg via ORAL
  Filled 2015-01-21 (×5): qty 1

## 2015-01-21 MED ORDER — INSULIN ASPART 100 UNIT/ML ~~LOC~~ SOLN
0.0000 [IU] | Freq: Three times a day (TID) | SUBCUTANEOUS | Status: DC
  Administered 2015-01-21: 11 [IU] via SUBCUTANEOUS
  Administered 2015-01-22: 3 [IU] via SUBCUTANEOUS

## 2015-01-21 MED ORDER — PANTOPRAZOLE SODIUM 20 MG PO TBEC
20.0000 mg | DELAYED_RELEASE_TABLET | Freq: Every day | ORAL | Status: DC
  Administered 2015-01-21 – 2015-01-23 (×3): 20 mg via ORAL
  Filled 2015-01-21 (×3): qty 1

## 2015-01-21 MED ORDER — HEPARIN SODIUM (PORCINE) 5000 UNIT/ML IJ SOLN
5000.0000 [IU] | Freq: Three times a day (TID) | INTRAMUSCULAR | Status: DC
Start: 2015-01-21 — End: 2015-01-23
  Administered 2015-01-21 – 2015-01-23 (×5): 5000 [IU] via SUBCUTANEOUS
  Filled 2015-01-21 (×5): qty 1

## 2015-01-21 MED ORDER — QUETIAPINE FUMARATE 200 MG PO TABS
200.0000 mg | ORAL_TABLET | Freq: Every day | ORAL | Status: DC
  Administered 2015-01-21 – 2015-01-22 (×2): 200 mg via ORAL
  Filled 2015-01-21: qty 1
  Filled 2015-01-21 (×2): qty 8
  Filled 2015-01-21: qty 1

## 2015-01-21 MED ORDER — DOCUSATE SODIUM 100 MG PO CAPS
100.0000 mg | ORAL_CAPSULE | Freq: Two times a day (BID) | ORAL | Status: DC
  Administered 2015-01-21 – 2015-01-23 (×5): 100 mg via ORAL
  Filled 2015-01-21 (×5): qty 1

## 2015-01-21 MED ORDER — ISOSORBIDE MONONITRATE ER 60 MG PO TB24
60.0000 mg | ORAL_TABLET | Freq: Every day | ORAL | Status: DC
  Administered 2015-01-21 – 2015-01-23 (×3): 60 mg via ORAL
  Filled 2015-01-21 (×3): qty 1

## 2015-01-21 MED ORDER — HYDRALAZINE HCL 50 MG PO TABS
100.0000 mg | ORAL_TABLET | Freq: Two times a day (BID) | ORAL | Status: DC
  Administered 2015-01-21 – 2015-01-23 (×5): 100 mg via ORAL
  Filled 2015-01-21 (×5): qty 2

## 2015-01-21 MED ORDER — FUROSEMIDE 10 MG/ML IJ SOLN
40.0000 mg | Freq: Two times a day (BID) | INTRAMUSCULAR | Status: DC
Start: 2015-01-21 — End: 2015-01-23
  Administered 2015-01-21 – 2015-01-23 (×5): 40 mg via INTRAVENOUS
  Filled 2015-01-21 (×6): qty 4

## 2015-01-21 MED ORDER — SODIUM CHLORIDE 0.9 % IJ SOLN
3.0000 mL | Freq: Two times a day (BID) | INTRAMUSCULAR | Status: DC
  Administered 2015-01-21 – 2015-01-23 (×5): 3 mL via INTRAVENOUS

## 2015-01-21 MED ORDER — FERROUS GLUCONATE 324 (38 FE) MG PO TABS
324.0000 mg | ORAL_TABLET | Freq: Every day | ORAL | Status: DC
  Administered 2015-01-21 – 2015-01-23 (×2): 324 mg via ORAL
  Filled 2015-01-21 (×5): qty 1

## 2015-01-21 MED ORDER — AMLODIPINE BESYLATE 10 MG PO TABS
10.0000 mg | ORAL_TABLET | Freq: Every day | ORAL | Status: DC
  Administered 2015-01-21 – 2015-01-23 (×3): 10 mg via ORAL
  Filled 2015-01-21 (×3): qty 1

## 2015-01-21 MED ORDER — ONDANSETRON HCL 4 MG/2ML IJ SOLN: 4.0000 mg | Freq: Four times a day (QID) | INTRAMUSCULAR | Status: DC | PRN

## 2015-01-21 MED ORDER — ASPIRIN 325 MG PO TABS
325.0000 mg | ORAL_TABLET | Freq: Every day | ORAL | Status: DC
  Administered 2015-01-21 – 2015-01-23 (×3): 325 mg via ORAL
  Filled 2015-01-21 (×3): qty 1

## 2015-01-21 MED ORDER — FLUPHENAZINE HCL 1 MG PO TABS
1.0000 mg | ORAL_TABLET | Freq: Every day | ORAL | Status: DC
  Administered 2015-01-21 – 2015-01-23 (×3): 1 mg via ORAL
  Filled 2015-01-21 (×4): qty 1

## 2015-01-21 MED ORDER — BUMETANIDE 0.25 MG/ML IJ SOLN
1.0000 mg | Freq: Once | INTRAMUSCULAR | Status: AC
  Administered 2015-01-21: 1 mg via INTRAVENOUS
  Filled 2015-01-21: qty 4

## 2015-01-21 MED ORDER — TRAMADOL HCL 50 MG PO TABS: 50.0000 mg | ORAL_TABLET | Freq: Four times a day (QID) | ORAL | Status: DC | PRN

## 2015-01-21 MED ORDER — ACETAMINOPHEN 325 MG PO TABS: 650.0000 mg | ORAL_TABLET | ORAL | Status: DC | PRN

## 2015-01-21 MED ORDER — INFLUENZA VAC SPLIT QUAD 0.5 ML IM SUSY
0.5000 mL | PREFILLED_SYRINGE | INTRAMUSCULAR | Status: DC
  Filled 2015-01-21: qty 0.5

## 2015-01-21 MED ORDER — INSULIN GLARGINE 100 UNIT/ML ~~LOC~~ SOLN
20.0000 [IU] | Freq: Two times a day (BID) | SUBCUTANEOUS | Status: DC
  Administered 2015-01-21 – 2015-01-22 (×4): 20 [IU] via SUBCUTANEOUS
  Filled 2015-01-21 (×6): qty 0.2

## 2015-01-21 MED ORDER — SODIUM CHLORIDE 0.9 % IV SOLN: 250.0000 mL | INTRAVENOUS | Status: DC | PRN

## 2015-01-21 MED ORDER — SODIUM CHLORIDE 0.9 % IJ SOLN
3.0000 mL | INTRAMUSCULAR | Status: DC | PRN
  Administered 2015-01-22: 3 mL via INTRAVENOUS
  Filled 2015-01-21: qty 3

## 2015-01-21 MED ORDER — PNEUMOCOCCAL VAC POLYVALENT 25 MCG/0.5ML IJ INJ
0.5000 mL | INJECTION | INTRAMUSCULAR | Status: DC
  Filled 2015-01-21: qty 0.5

## 2015-01-21 MED ORDER — IPRATROPIUM-ALBUTEROL 0.5-2.5 (3) MG/3ML IN SOLN: 3.0000 mL | RESPIRATORY_TRACT | Status: DC | PRN

## 2015-01-21 MED ORDER — BENZTROPINE MESYLATE 0.5 MG PO TABS
0.5000 mg | ORAL_TABLET | Freq: Two times a day (BID) | ORAL | Status: DC
  Administered 2015-01-21 – 2015-01-23 (×5): 0.5 mg via ORAL
  Filled 2015-01-21 (×7): qty 1

## 2015-01-21 MED ORDER — ALBUTEROL SULFATE (2.5 MG/3ML) 0.083% IN NEBU: 3.0000 mL | INHALATION_SOLUTION | Freq: Four times a day (QID) | RESPIRATORY_TRACT | Status: DC | PRN

## 2015-01-21 NOTE — H&P (Signed)
[facility] History and Physical                                                                                    Robin [name], is a 64 y.o. female  MRN: [id number]   DOB - November 13, 1950  Admit Date - 01/20/2015  Outpatient Primary MD for the patient is [facility]  Referring Physician:  Rolland Porter, MD  Chief Complaint:   Chief Complaint  Patient presents with  . Altered Mental Status     HPI  Robin [name]  is a 64 y.o. female, with chronic kidney disease stage IV, diastolic dysfunction grade 2, bipolar affective disorder, diabetes mellitus, and recent bilateral ankle fractures in June 2016. She presented emergency department this morning with altered mental status. On my exam the patient is alert and able to give history. She states that Friday morning at approximately 12 AM she fell out of bed. She was found on the floor by her children who wanted to bring her to the hospital because she was confused. She refused their assistance at the time, but decided to come last night.  She noted swelling particularly in her lower extremities bilaterally.  She believes her confusion was due to recurrent falling.  Her daughter told the ED physician that she was concerned her mother had taken oxycodone as that has caused confusion in the past.  UDS was negative.  Unfortunately the daughter is not currently available to provide further history.  The patient was discharged from [facility] rehabilitation on 7/26, and has been at home alone since.  Her daughter brings her food each day, and she is able to dress and bathe herself.  She has had several falls.  She does not complain of SOB, CP, palpitations, fever, vomiting, dysuria, or diarrhea.    In the ER, BNP is 1500, Troponin is mildly elevated, creatinine is at baseline 3.56, hgb is down to 7.6 from 10.5 on 6/24.  UDS is clear, U/A is negative, CT head is negative, wbc is not elevated. CXR with mild pulmonary vascular congestion and perihilar  edema.   Review of Systems   In addition to the HPI above,  No Fever-chills, No Headache, No changes with Vision or hearing, No problems swallowing food or Liquids, No Chest pain, Cough or Shortness of Breath, No Abdominal pain, No Nausea or Vomiting, Bowel movements are regular, No Blood in stool or Urine, No dysuria, No new skin rashes or bruises, No new joints pains-aches,  No new weakness, tingling, numbness in any extremity, No recent weight gain or loss, A full 10 point Review of Systems was done, except as stated above, all other Review of Systems were negative.  Past Medical History  Past Medical History  Diagnosis Date  . Coronary artery disease   . Bipolar disorder   . Anemia   . History of blood transfusion   . DM type 2, uncontrolled, with renal complications   . Benign hypertension     Past Surgical History  Procedure Laterality Date  . Cardiac catheterization      2 stent   . Av fistula placement Left may-2016    done  at [facility]  . Tonsillectomy    . Abdominal hysterectomy    . Cholecystectomy    . Ankle closed reduction N/A 11/17/2014    Procedure: CLOSED REDUCTION ANKLE;  Surgeon: Earlie Server, MD;  Location: [place];  Service: Orthopedics;  Laterality: N/A;  . Orif ankle fracture Left 11/20/2014    Procedure: OPEN REDUCTION INTERNAL FIXATION (ORIF) ANKLE FRACTURE;  Surgeon: Renette Butters, MD;  Location: [place];  Service: Orthopedics;  Laterality: Left;  . Coronary stent placement        Social History Social History  Substance Use Topics  . Smoking status: Current Every Day Smoker -- 0.00 packs/day    Types: Cigarettes  . Smokeless tobacco: Not on file  . Alcohol Use: No  Previous smoker.    Family History Diabetes in multiple family members.  Prior to Admission medications   Medication Sig Start Date End Date Taking? Authorizing Provider  albuterol (PROVENTIL HFA;VENTOLIN HFA) 108 (90 BASE) MCG/ACT inhaler Inhale 1-2 puffs into the  lungs every 6 (six) hours as needed for wheezing or shortness of breath.   Yes Historical Provider, MD  amLODipine (NORVASC) 10 MG tablet Take 10 mg by mouth daily.   Yes Historical Provider, MD  aspirin 325 MG tablet Take 1 tablet (325 mg total) by mouth daily. 11/20/14  Yes Brittney Claiborne Billings, PA-C  benztropine (COGENTIN) 0.5 MG tablet Take 0.5 mg by mouth 2 (two) times daily.   Yes Historical Provider, MD  bumetanide (BUMEX) 2 MG tablet Take 2 mg by mouth daily.   Yes Historical Provider, MD  carvedilol (COREG) 25 MG tablet Take 25 mg by mouth 2 (two) times daily with a meal.   Yes Historical Provider, MD  darbepoetin (ARANESP) 40 MCG/0.4ML SOLN injection Inject 40 mcg into the skin every 14 (fourteen) days.    Yes Historical Provider, MD  docusate sodium (COLACE) 100 MG capsule Take 1 capsule (100 mg total) by mouth 2 (two) times daily. 11/20/14  Yes Brittney Claiborne Billings, PA-C  ferrous gluconate (FERGON) 324 MG tablet Take 324 mg by mouth daily with breakfast.   Yes Historical Provider, MD  fluPHENAZine (PROLIXIN) 1 MG tablet Take 1 mg by mouth daily.   Yes Historical Provider, MD  gabapentin (NEURONTIN) 300 MG capsule Take 300 mg by mouth 2 (two) times daily.   Yes Historical Provider, MD  hydrALAZINE (APRESOLINE) 100 MG tablet Take 100 mg by mouth 2 (two) times daily.   Yes Historical Provider, MD  HYDROcodone-acetaminophen (NORCO/VICODIN) 5-325 MG per tablet 1 by mouth every 4-6 hours as needed DO NOT EXCEED 4 GM OF APAP IN 24 HOURS 12/01/14  Yes Gildardo Cranker, DO  insulin aspart (NOVOLOG) 100 UNIT/ML injection Inject 7-15 Units into the skin 3 (three) times daily before meals. Sliding scale   Yes Historical Provider, MD  insulin detemir (LEVEMIR) 100 UNIT/ML injection Inject 0.12 mLs (12 Units total) into the skin at bedtime. Patient taking differently: Inject 20-40 Units into the skin at bedtime. Takes 40 units in am, 20 units in pm 11/22/14  Yes Bonnielee Haff, MD  isosorbide mononitrate (IMDUR) 60 MG 24  hr tablet Take 60 mg by mouth daily.   Yes Historical Provider, MD  Multiple Vitamins-Minerals (MULTIVITAMIN WITH MINERALS) tablet Take 1 tablet by mouth daily.   Yes Historical Provider, MD  pantoprazole (PROTONIX) 20 MG tablet Take 20 mg by mouth daily.   Yes Historical Provider, MD  QUEtiapine (SEROQUEL) 100 MG tablet Take 2 tablets (200 mg total) by mouth  at bedtime. 11/22/14  Yes Bonnielee Haff, MD  ondansetron (ZOFRAN) 4 MG tablet Take 1 tablet (4 mg total) by mouth every 8 (eight) hours as needed for nausea or vomiting. 11/20/14   Lovett Calender, PA-C  traMADol (ULTRAM) 50 MG tablet Take 1 tablet (50 mg total) by mouth every 6 (six) hours as needed. 11/22/14   Lovett Calender, PA-C    Allergies  Allergen Reactions  . Oxycodone Other (See Comments)    Makes patient lethargic, sleeping for few days, altered mental status    Physical Exam  Vitals  Blood pressure 159/68, pulse 81, temperature 98.7 F (37.1 C), temperature source Oral, resp. rate 16, height 5\' 4"  (1.626 m), weight 82.9 kg (182 lb 12.2 oz), SpO2 96 %.   General:  Merian Capron, AA female, A&O, lying in bed in NAD  Psych:  Slightly slow to respond, but appropriate.  Not Suicidal or Homicidal, Awake Alert, Oriented X 3.  Neuro:   No F.N deficits, ALL C.Nerves Intact, Strength 5/5 all 4 extremities, Sensation intact all 4 extremities.  ENT:  Ears and Eyes appear Normal, Conjunctivae clear, PER. Moist oral mucosa without erythema or exudates.  Neck:  Supple, No lymphadenopathy appreciated  Respiratory:  Symmetrical chest wall movement, mild bilateral wheezes.  Cardiac:  RRR, No Murmurs,  no JVD, 1-2+ bilateral LLE  Abdomen:  Positive bowel sounds, Soft, Non tender, Non distended,  No masses appreciated  Skin:  No Cyanosis, Normal Skin Turgor, No Skin Rash or Bruise.  Extremities:  Able to move all 4. LLE in supportive boot.  Data Review  CBC  Recent Labs Lab 01/20/15 2140  WBC 7.8  HGB 7.6*  HCT 24.1*  PLT 269   MCV 92.3  MCH 29.1  MCHC 31.5  RDW 18.4*  LYMPHSABS 1.3  MONOABS 0.7  EOSABS 0.1  BASOSABS 0.0    Chemistries   Recent Labs Lab 01/20/15 2140  NA 141  K 3.9  CL 111  CO2 21*  GLUCOSE 110*  BUN 42*  CREATININE 3.56*  CALCIUM 8.8*  AST 47*  ALT 54  ALKPHOS 106  BILITOT 0.7     Cardiac Enzymes  Recent Labs Lab 01/21/15 0100  TROPONINI 0.04*     Urinalysis    Component Value Date/Time   COLORURINE YELLOW 01/21/2015 0416   APPEARANCEUR CLEAR 01/21/2015 0416   LABSPEC 1.015 01/21/2015 0416   PHURINE 5.0 01/21/2015 0416   GLUCOSEU NEGATIVE 01/21/2015 0416   HGBUR NEGATIVE 01/21/2015 0416   BILIRUBINUR NEGATIVE 01/21/2015 0416   KETONESUR NEGATIVE 01/21/2015 0416   PROTEINUR 100* 01/21/2015 0416   UROBILINOGEN 0.2 01/21/2015 0416   NITRITE NEGATIVE 01/21/2015 0416   LEUKOCYTESUR NEGATIVE 01/21/2015 0416    Imaging results:   Ct Head Wo Contrast  01/21/2015   CLINICAL DATA:  Disoriented and altered mental status, intermittent confusion for 1 week with facial swelling and leg swelling. History of diabetes, hypertension.  EXAM: CT HEAD WITHOUT CONTRAST  CT MAXILLOFACIAL WITHOUT CONTRAST  TECHNIQUE: Multidetector CT imaging of the head and maxillofacial structures were performed using the standard protocol without intravenous contrast. Multiplanar CT image reconstructions of the maxillofacial structures were also generated.  COMPARISON:  CT head November 18, 2014  FINDINGS: CT HEAD FINDINGS  Stable moderate ventriculomegaly, with commensurate enlargement of cerebral sulci and cerebellar folia. No intraparenchymal hemorrhage, mass effect nor midline shift. Mild supratentorial white matter hypodensities are within normal range for patient's age and though non-specific suggest sequelae of chronic small vessel ischemic disease. No  acute large vascular territory infarcts.  No abnormal extra-axial fluid collections. Basal cisterns are patent. Moderate to severe calcific  atherosclerosis of the carotid siphons, to lesser extent included vertebral arteries. No skull fracture.  CT MAXILLOFACIAL FINDINGS  The mandible is intact. The condyles are located. No acute facial fracture. Torus palatini. Multiple absent teeth. Nasal septum is midline. Paranasal sinuses and mastoid air cells are well aerated. No destructive bony lesions.  Ocular globes intact, lenses are located. Normal appearance optic nerve sheath complexes. Normal appearance the extraocular muscles. Preservation orbital fat.  Moderate calcific atherosclerosis of the LEFT greater the RIGHT carotid bulbs. Mild subcutaneous fat inflammation without focal hematoma, focal fluid collection or subcutaneous gas.  IMPRESSION: CT HEAD: No acute intracranial process.  Stable appearance of the head including moderate parenchymal brain volume loss, advanced for age.  CT MAXILLOFACIAL: No acute facial fracture.  Mild subcutaneous fat stranding can be seen with cellulitis or inflammation.   Electronically Signed   By: Elon Alas M.D.   On: 01/21/2015 00:32   Dg Chest Port 1 View  01/21/2015   CLINICAL DATA:  Shortness of breath  EXAM: PORTABLE CHEST - 1 VIEW  COMPARISON:  11/17/2014  FINDINGS: Cardiac enlargement with mild pulmonary vascular congestion. Hazy perihilar infiltration suggests perihilar edema. No blunting of costophrenic angles. No pneumothorax. Mediastinal contours appear intact.  IMPRESSION: Cardiac enlargement with mild pulmonary vascular congestion and perihilar edema.   Electronically Signed   By: Lucienne Capers M.D.   On: 01/21/2015 00:35   Ct Maxillofacial Wo Cm  01/21/2015   CLINICAL DATA:  Disoriented and altered mental status, intermittent confusion for 1 week with facial swelling and leg swelling. History of diabetes, hypertension.  EXAM: CT HEAD WITHOUT CONTRAST  CT MAXILLOFACIAL WITHOUT CONTRAST  TECHNIQUE: Multidetector CT imaging of the head and maxillofacial structures were performed using the  standard protocol without intravenous contrast. Multiplanar CT image reconstructions of the maxillofacial structures were also generated.  COMPARISON:  CT head November 18, 2014  FINDINGS: CT HEAD FINDINGS  Stable moderate ventriculomegaly, with commensurate enlargement of cerebral sulci and cerebellar folia. No intraparenchymal hemorrhage, mass effect nor midline shift. Mild supratentorial white matter hypodensities are within normal range for patient's age and though non-specific suggest sequelae of chronic small vessel ischemic disease. No acute large vascular territory infarcts.  No abnormal extra-axial fluid collections. Basal cisterns are patent. Moderate to severe calcific atherosclerosis of the carotid siphons, to lesser extent included vertebral arteries. No skull fracture.  CT MAXILLOFACIAL FINDINGS  The mandible is intact. The condyles are located. No acute facial fracture. Torus palatini. Multiple absent teeth. Nasal septum is midline. Paranasal sinuses and mastoid air cells are well aerated. No destructive bony lesions.  Ocular globes intact, lenses are located. Normal appearance optic nerve sheath complexes. Normal appearance the extraocular muscles. Preservation orbital fat.  Moderate calcific atherosclerosis of the LEFT greater the RIGHT carotid bulbs. Mild subcutaneous fat inflammation without focal hematoma, focal fluid collection or subcutaneous gas.  IMPRESSION: CT HEAD: No acute intracranial process.  Stable appearance of the head including moderate parenchymal brain volume loss, advanced for age.  CT MAXILLOFACIAL: No acute facial fracture.  Mild subcutaneous fat stranding can be seen with cellulitis or inflammation.   Electronically Signed   By: Elon Alas M.D.   On: 01/21/2015 00:32    My personal review of EKG: Flattened T waves, prolonged QT.   Assessment & Plan  Principal Problem:   Acute on chronic diastolic heart failure Active Problems:  Acute encephalopathy   Multiple  falls   DM type 2, uncontrolled, with renal complications   Essential hypertension   CKD (chronic kidney disease)   Confusion   Anemia   Acute on chronic diastolic heart failure BNP 1500, chest x-ray with CHF.   Most recent 2-D echo June 2016 LVEF 55 - 60% with grade 2 diastolic dysfunction Diurese using IV Lasix 40 mg twice a day, strict I's and O's, daily weights Low-salt diet.  Patient[facility] cardi[facility] at [facility]  Altered mental status Uncertain etiology.  UDS is clean, Head CT is clean.  Patient alert, orientated and appropriate. CBG was reportedly low, but I do not see this documented. Possibly due to medication mis-management (patient is on multiple psychotropics) Continue to monitor.  Elevated troponin (0. 04) In the setting of stage IV kidney disease. Possible mild EKG changes. Patient denies chest pain. Cycle troponins. Repeat EKG.  Patient with 11 beat run of Vtach per RN.  Will check magnesium. Will not repeat 2-D echocardiogram at this time.  Acute on chronic normocytic anemia Patient is on iron and Aranesp. Hemoglobin appears to drop from 10.5 on 6/24 to 7.6 today Recent stool for guaiac was negative. We will repeat this. Will plan to diurese patient and repeat CBC in the morning. Check orthostatics.  Type and screen Would consider blood transfusion on 8/29.  CKD Stage IV. AV fistula is in place.  Creatinine is currently at baseline. Will monitor closely while diuresing. Avoid nephrotoxic medications.  Multiple falls At home alone.  Have requested that PT/OT evaluate. Patient may need SNF once again.   Consultants Called:  None  Family Communication:   Attempted to call daughter Judson Roch, but no answer  Code Status:  Full  Condition:  Stable.  Potential Disposition: To be determined based on further work up.  Estimate 3 day stay.  Time spent in minutes : [address]  PA-C on 01/21/2015 at 8:06 AM Between 7am to 7pm - Pager -  [phone] After 7pm go to [email or web address] - password [email or web address] And look for the night coverage person covering me after hours  [facility]

## 2015-01-21 NOTE — Evaluation (Signed)
Physical Therapy Evaluation Patient Details Name: DIEU MINION MRN: [id number] DOB: Jan 17, 1951 Today's Date: 01/21/2015   History of Present Illness  64 y/o female brought in by daughter due to SOB, increase LE swelling and confusion. Pt with left ankle fx june 2016, bipolar, CAD, DM  Clinical Impression  Pt very pleasant with decreased recall of home function and setup initially with increased time to provide. Pt reports currently receiving HHPT and using rollator at home with assist of family. Pt will benefit from acute therapy to maximize mobility, gait and function to decrease burden of care.     Follow Up Recommendations Home health PT;Supervision - Intermittent    Equipment Recommendations  None recommended by PT    Recommendations for Other Services       Precautions / Restrictions Precautions Precautions: Fall      Mobility  Bed Mobility Overal bed mobility: Modified Independent                Transfers Overall transfer level: Needs assistance   Transfers: Sit to/from Stand Sit to Stand: Supervision         General transfer comment: cues for hand placement  Ambulation/Gait Ambulation/Gait assistance: Supervision Ambulation Distance (Feet): 90 Feet Assistive device: Rolling walker (2 wheeled) Gait Pattern/deviations: Step-to pattern;Decreased stance time - left   Gait velocity interpretation: Below normal speed for age/gender General Gait Details: cues for posture with gait limited by fatigue  Stairs            Wheelchair Mobility    Modified Rankin (Stroke Patients Only)       Balance Overall balance assessment: Needs assistance   Sitting balance-Leahy Scale: Good       Standing balance-Leahy Scale: Fair                               Pertinent Vitals/Pain Pain Assessment: No/denies pain  HR 84 98% RA    Home Living Family/patient expects to be discharged to:: Private residence Living Arrangements:  Alone Available Help at Discharge: Family;Available PRN/intermittently Type of Home: House Home Access: Stairs to enter   Entrance Stairs-Number of Steps: 3 Home Layout: Multi-level;Able to live on main level with bedroom/bathroom Home Equipment: Gilford Rile - 2 wheels;Walker - 4 wheels;Wheelchair - manual;Bedside commode      Prior Function Level of Independence: Independent with assistive device(s);Needs assistance   Gait / Transfers Assistance Needed: mod I with RW for limited distance since Left ankle sx  ADL's / Homemaking Assistance Needed: family has been doing the shopping, cooking and cleaning. pt sponge bathes and toilets independently        Hand Dominance        Extremity/Trunk Assessment   Upper Extremity Assessment: Overall WFL for tasks assessed           Lower Extremity Assessment: Generalized weakness      Cervical / Trunk Assessment: Normal  Communication   Communication: No difficulties  Cognition Arousal/Alertness: Awake/alert Behavior During Therapy: Flat affect Overall Cognitive Status: Impaired/Different from baseline Area of Impairment: Memory;Safety/judgement     Memory: Decreased short-term memory   Safety/Judgement: Decreased awareness of safety          General Comments      Exercises General Exercises - Lower Extremity Hip Flexion/Marching: AROM;Seated;Both;15 reps      Assessment/Plan    PT Assessment Patient needs continued PT services  PT Diagnosis Difficulty walking   PT Problem List Decreased  activity tolerance;Decreased balance;Decreased mobility;Decreased knowledge of use of DME;Decreased safety awareness  PT Treatment Interventions Gait training;DME instruction;Functional mobility training;Therapeutic activities;Balance training;Patient/family education;Stair training   PT Goals (Current goals can be found in the Care Plan section) Acute Rehab PT Goals Patient Stated Goal: return home PT Goal Formulation: With  patient Time For Goal Achievement: 02/04/15 Potential to Achieve Goals: Good    Frequency Min 3X/week   Barriers to discharge Decreased caregiver support      Co-evaluation               End of Session   Activity Tolerance: Patient tolerated treatment well Patient left: in chair;with call bell/phone within reach;with chair alarm set Nurse Communication: Mobility status         Time: [id number] PT Time Calculation (min) (ACUTE ONLY): 19 min   Charges:   PT Evaluation $Initial PT Evaluation Tier I: 1 Procedure     PT G CodesMelford Aase 01/21/2015, 1:04 PM Elwyn Reach, [place]

## 2015-01-21 NOTE — Progress Notes (Addendum)
Pt transferred to [facility] [address] per MD order. NAD VSS no c/o pain at this time. Report given to receiving RN. Daughter at bedside for transfer.

## 2015-01-21 NOTE — Progress Notes (Signed)
Spoke with Imogene Burn, PA about orders for type and screen as well as occult stool testing. No signs of bleeding at this time, Hgb 7.6. Orders to keep heparin subq to be given this am. Will cont to monitor.

## 2015-01-22 DIAGNOSIS — I5033 Acute on chronic diastolic (congestive) heart failure: Principal | ICD-10-CM

## 2015-01-22 LAB — GLUCOSE, CAPILLARY
GLUCOSE-CAPILLARY: 110 mg/dL — AB (ref 65–99)
Glucose-Capillary: 151 mg/dL — ABNORMAL HIGH (ref 65–99)
Glucose-Capillary: 81 mg/dL (ref 65–99)
Glucose-Capillary: 59 mg/dL — ABNORMAL LOW (ref 65–99)
Glucose-Capillary: 97 mg/dL (ref 65–99)
Glucose-Capillary: 160 mg/dL — ABNORMAL HIGH (ref 65–99)

## 2015-01-22 LAB — HEMOGLOBIN AND HEMATOCRIT, BLOOD
HEMATOCRIT: 28.7 % — AB (ref 36.0–46.0)
HEMOGLOBIN: 9 g/dL — AB (ref 12.0–15.0)

## 2015-01-22 LAB — BASIC METABOLIC PANEL
Anion gap: 7 (ref 5–15)
BUN: 42 mg/dL — AB (ref 6–20)
CALCIUM: 8.1 mg/dL — AB (ref 8.9–10.3)
CHLORIDE: 112 mmol/L — AB (ref 101–111)
CO2: 20 mmol/L — AB (ref 22–32)
CREATININE: 3.52 mg/dL — AB (ref 0.44–1.00)
GFR calc Af Amer: 15 mL/min — ABNORMAL LOW (ref >60)
GFR calc non Af Amer: 13 mL/min — ABNORMAL LOW (ref >60)
GLUCOSE: 225 mg/dL — AB (ref 65–99)
Potassium: 3.9 mmol/L (ref 3.5–5.1)
Sodium: 139 mmol/L (ref 135–145)

## 2015-01-22 LAB — CBC
HEMATOCRIT: 21.4 % — AB (ref 36.0–46.0)
Hemoglobin: 6.6 g/dL — CL (ref 12.0–15.0)
MCH: 28.9 pg (ref 26.0–34.0)
MCHC: 30.8 g/dL (ref 30.0–36.0)
MCV: 93.9 fL (ref 78.0–100.0)
Platelets: 248 10*3/uL (ref 150–400)
RBC: 2.28 MIL/uL — ABNORMAL LOW (ref 3.87–5.11)
RDW: 18.4 % — AB (ref 11.5–15.5)
WBC: 6.6 10*3/uL (ref 4.0–10.5)

## 2015-01-22 LAB — HEMOGLOBIN A1C
Hgb A1c MFr Bld: 6.4 % — ABNORMAL HIGH (ref 4.8–5.6)
Mean Plasma Glucose: 137 mg/dL

## 2015-01-22 LAB — PREPARE RBC (CROSSMATCH)

## 2015-01-22 LAB — TROPONIN I: TROPONIN I: 0.03 ng/mL (ref <0.031)

## 2015-01-22 LAB — LACTATE DEHYDROGENASE: LDH: 244 U/L — AB (ref 98–192)

## 2015-01-22 MED ORDER — SODIUM CHLORIDE 0.9 % IV SOLN: Freq: Once | INTRAVENOUS | Status: DC

## 2015-01-22 MED ORDER — FUROSEMIDE 10 MG/ML IJ SOLN: 10.0000 mg | Freq: Once | INTRAMUSCULAR | Status: DC

## 2015-01-22 MED ORDER — DIPHENHYDRAMINE HCL 50 MG/ML IJ SOLN: 25.0000 mg | Freq: Four times a day (QID) | INTRAMUSCULAR | Status: DC | PRN

## 2015-01-22 NOTE — Progress Notes (Signed)
Inpatient Diabetes Program Recommendations  AACE/ADA: New Consensus Statement on Inpatient Glycemic Control (2013)  Target Ranges:  Prepandial:   less than 140 mg/dL      Peak postprandial:   less than 180 mg/dL (1-2 hours)      Critically ill patients:  140 - 180 mg/dL   Results for Robin [name], Robin [name] (MRN [id number]) as of 01/22/2015 08:16  Ref. Range 01/21/2015 08:04 01/21/2015 11:44 01/21/2015 17:42 01/21/2015 21:14 01/22/2015 05:44 01/22/2015 08:17  Glucose-Capillary Latest Ref Range: 65-99 mg/dL 141 (H) 301 (H) 106 (H) 236 (H) 151 (H) 110 (H)    Diabetes history: DM2 Outpatient Diabetes medications: Levemir 40 units QAM, Levemir 20 units QPM, Novolog 7-15 units TID with meals Current orders for Inpatient glycemic control: Lantus 20 units BID, Novolog 0-15 units TID with meals  Inpatient Diabetes Program Recommendations Insulin - Meal Coverage: If patient is eating at least 50% of meals, please consider ordering Novolog 3 units TID with meals for meal coverage.  Thanks, Barnie Alderman, RN, MSN, CCRN, CDE Diabetes Coordinator Inpatient Diabetes Program [phone] (Team Pager from [place] to [place]) [phone] ([place] office) [phone] [facility] office) [phone] [facility] office)

## 2015-01-22 NOTE — Progress Notes (Signed)
Patient Demographics:    Robin [name], is a 64 y.o. female, DOB - [birth year], GC:[id number]  Admit date - 01/20/2015   Admitting Physician No admitting provider for patient encounter.  Outpatient Primary MD for the patient is [facility]  LOS - 1   Chief Complaint  Patient presents with  . Altered Mental Status        Subjective:    [name] today has, No headache, No chest pain, No abdominal pain - No Nausea, No new weakness tingling or numbness, No Cough - SOB.    Assessment  & Plan :     1. Encephalopathy. Could have been delirium with early onset dementia -  Her mentation seems to be back to baseline, no focal deficits, head CT unremarkable. Supportive care and monitor. Minimize narcotics and benzodiazepine.    2. Anemia. Heme occult stool negative few days ago, will be repeated, iron panel is stable. We will get peripheral smear to be reviewed by pathologist, check LDH heptoglobin and monitor. She is getting 2 units of packed RBC transfused on 01/22/2015.   3. Essential hypertension. Continue present combination of Norvasc, Coreg, hydralazine along with Imdur.   4. Multiple falls at home with recent bilateral ankle fracture. Supportive care, boot to be worn in left leg when ambulating, and eval.   5. CK D stage V. Baseline creatinine close to 3.5. At baseline monitor.   6. Mild acute on Chronic grade 2 diastolic CHF last EF [id number] few months ago. Likely decompensated due to anemia, diurese with Lasix and monitor. Continue Coreg hydralazine and Imdur.   7. False-positive point-of-care troponin. Subsequent troponin in the lab negative. EKG nonacute. No chest pain. This was a false positive test no further workup.   8. DM type II. Continue Lantus along with sliding  scale.  Lab Results  Component Value Date   HGBA1C 6.4* 01/21/2015    CBG (last 3)   Recent Labs  01/21/15 2114 01/22/15 0544 01/22/15 0817  GLUCAP 236* 151* 110*       Code Status : Full  Family Communication  : Daughter bedside  Disposition Plan  : Home in the morning  Consults  :  None  Procedures  : CT head and maxillofacial area. Nonacute  DVT Prophylaxis  : Heparin    Lab Results  Component Value Date   PLT 248 01/22/2015    Inpatient Medications  Scheduled Meds: . sodium chloride   Intravenous Once  . amLODipine  10 mg Oral Daily  . aspirin  325 mg Oral Daily  . benztropine  0.5 mg Oral BID  . carvedilol  25 mg Oral BID WC  . docusate sodium  100 mg Oral BID  . ferrous gluconate  324 mg Oral Q breakfast  . fluPHENAZine  1 mg Oral Daily  . furosemide  40 mg Intravenous BID  . gabapentin  300 mg Oral BID  . heparin  5,000 Units Subcutaneous 3 times per day  . hydrALAZINE  100 mg Oral BID  . Influenza vac split quadrivalent PF  0.5 mL Intramuscular Tomorrow-1000  . insulin aspart  0-15 Units Subcutaneous TID WC  . insulin glargine  20 Units Subcutaneous BID  . isosorbide mononitrate  60 mg  Oral Daily  . pantoprazole  20 mg Oral Daily  . pneumococcal 23 valent vaccine  0.5 mL Intramuscular Tomorrow-1000  . potassium chloride  10 mEq Oral Daily  . QUEtiapine  200 mg Oral QHS  . sodium chloride  3 mL Intravenous Q12H   Continuous Infusions:  PRN Meds:.sodium chloride, acetaminophen, ipratropium-albuterol, ondansetron (ZOFRAN) IV, sodium chloride, traMADol  Antibiotics  :     Anti-infectives    None        Objective:   Filed Vitals:   01/22/15 0600 01/22/15 0644 01/22/15 0903 01/22/15 1000  BP: 136/65 151/70 155/71 154/68  Pulse: 76 75 74 78  Temp: 98.8 F (37.1 C) 98.4 F (36.9 C) 98.5 F (36.9 C) 98.3 F (36.8 C)  TempSrc: Oral Oral Oral Oral  Resp: 18 18    Height:      Weight:      SpO2: 98% 98% 99% 100%    Wt Readings from  Last 3 Encounters:  01/21/15 83.598 kg (184 lb 4.8 oz)  12/01/14 83.915 kg (185 lb)  11/23/14 81.647 kg (180 lb)     Intake/Output Summary (Last 24 hours) at 01/22/15 1102 Last data filed at 01/22/15 1000  Gross per 24 hour  Intake    795 ml  Output   1775 ml  Net   -980 ml     Physical Exam  Awake Alert, Oriented X 3, No new F.N deficits, Normal affect [place].AT,PERRAL Supple Neck,No JVD, No cervical lymphadenopathy appriciated.  Symmetrical Chest wall movement, Good air movement bilaterally, CTAB RRR,No Gallops,Rubs or new Murmurs, No Parasternal Heave +ve B.Sounds, Abd Soft, No tenderness, No organomegaly appriciated, No rebound - guarding or rigidity. No Cyanosis, Clubbing or edema, No new Rash or bruise       Data Review:   Micro Results Recent Results (from the past 240 hour(s))  MRSA PCR Screening     Status: None   Collection Time: 01/21/15  6:52 AM  Result Value Ref Range Status   MRSA by PCR NEGATIVE NEGATIVE Final    Comment:        The GeneXpert MRSA Assay (FDA approved for NASAL specimens only), is one component of a comprehensive MRSA colonization surveillance program. It is not intended to diagnose MRSA infection nor to guide or monitor treatment for MRSA infections.     Radiology Reports Ct Head Wo Contrast  01/21/2015   CLINICAL DATA:  Disoriented and altered mental status, intermittent confusion for 1 week with facial swelling and leg swelling. History of diabetes, hypertension.  EXAM: CT HEAD WITHOUT CONTRAST  CT MAXILLOFACIAL WITHOUT CONTRAST  TECHNIQUE: Multidetector CT imaging of the head and maxillofacial structures were performed using the standard protocol without intravenous contrast. Multiplanar CT image reconstructions of the maxillofacial structures were also generated.  COMPARISON:  CT head November 18, 2014  FINDINGS: CT HEAD FINDINGS  Stable moderate ventriculomegaly, with commensurate enlargement of cerebral sulci and cerebellar folia. No  intraparenchymal hemorrhage, mass effect nor midline shift. Mild supratentorial white matter hypodensities are within normal range for patient's age and though non-specific suggest sequelae of chronic small vessel ischemic disease. No acute large vascular territory infarcts.  No abnormal extra-axial fluid collections. Basal cisterns are patent. Moderate to severe calcific atherosclerosis of the carotid siphons, to lesser extent included vertebral arteries. No skull fracture.  CT MAXILLOFACIAL FINDINGS  The mandible is intact. The condyles are located. No acute facial fracture. Torus palatini. Multiple absent teeth. Nasal septum is midline. Paranasal sinuses and mastoid air  cells are well aerated. No destructive bony lesions.  Ocular globes intact, lenses are located. Normal appearance optic nerve sheath complexes. Normal appearance the extraocular muscles. Preservation orbital fat.  Moderate calcific atherosclerosis of the LEFT greater the RIGHT carotid bulbs. Mild subcutaneous fat inflammation without focal hematoma, focal fluid collection or subcutaneous gas.  IMPRESSION: CT HEAD: No acute intracranial process.  Stable appearance of the head including moderate parenchymal brain volume loss, advanced for age.  CT MAXILLOFACIAL: No acute facial fracture.  Mild subcutaneous fat stranding can be seen with cellulitis or inflammation.   Electronically Signed   By: Elon Alas M.D.   On: 01/21/2015 00:32   Dg Chest Port 1 View  01/21/2015   CLINICAL DATA:  Shortness of breath  EXAM: PORTABLE CHEST - 1 VIEW  COMPARISON:  11/17/2014  FINDINGS: Cardiac enlargement with mild pulmonary vascular congestion. Hazy perihilar infiltration suggests perihilar edema. No blunting of costophrenic angles. No pneumothorax. Mediastinal contours appear intact.  IMPRESSION: Cardiac enlargement with mild pulmonary vascular congestion and perihilar edema.   Electronically Signed   By: Lucienne Capers M.D.   On: 01/21/2015 00:35    Ct Maxillofacial Wo Cm  01/21/2015   CLINICAL DATA:  Disoriented and altered mental status, intermittent confusion for 1 week with facial swelling and leg swelling. History of diabetes, hypertension.  EXAM: CT HEAD WITHOUT CONTRAST  CT MAXILLOFACIAL WITHOUT CONTRAST  TECHNIQUE: Multidetector CT imaging of the head and maxillofacial structures were performed using the standard protocol without intravenous contrast. Multiplanar CT image reconstructions of the maxillofacial structures were also generated.  COMPARISON:  CT head November 18, 2014  FINDINGS: CT HEAD FINDINGS  Stable moderate ventriculomegaly, with commensurate enlargement of cerebral sulci and cerebellar folia. No intraparenchymal hemorrhage, mass effect nor midline shift. Mild supratentorial white matter hypodensities are within normal range for patient's age and though non-specific suggest sequelae of chronic small vessel ischemic disease. No acute large vascular territory infarcts.  No abnormal extra-axial fluid collections. Basal cisterns are patent. Moderate to severe calcific atherosclerosis of the carotid siphons, to lesser extent included vertebral arteries. No skull fracture.  CT MAXILLOFACIAL FINDINGS  The mandible is intact. The condyles are located. No acute facial fracture. Torus palatini. Multiple absent teeth. Nasal septum is midline. Paranasal sinuses and mastoid air cells are well aerated. No destructive bony lesions.  Ocular globes intact, lenses are located. Normal appearance optic nerve sheath complexes. Normal appearance the extraocular muscles. Preservation orbital fat.  Moderate calcific atherosclerosis of the LEFT greater the RIGHT carotid bulbs. Mild subcutaneous fat inflammation without focal hematoma, focal fluid collection or subcutaneous gas.  IMPRESSION: CT HEAD: No acute intracranial process.  Stable appearance of the head including moderate parenchymal brain volume loss, advanced for age.  CT MAXILLOFACIAL: No acute facial  fracture.  Mild subcutaneous fat stranding can be seen with cellulitis or inflammation.   Electronically Signed   By: Elon Alas M.D.   On: 01/21/2015 00:32     CBC  Recent Labs Lab 01/20/15 2140 01/22/15 0318  WBC 7.8 6.6  HGB 7.6* 6.6*  HCT 24.1* 21.4*  PLT 269 248  MCV 92.3 93.9  MCH 29.1 28.9  MCHC 31.5 30.8  RDW 18.4* 18.4*  LYMPHSABS 1.3  --   MONOABS 0.7  --   EOSABS 0.1  --   BASOSABS 0.0  --     Chemistries   Recent Labs Lab 01/20/15 2140 01/21/15 1145 01/22/15 0318  NA 141  --  139  K 3.9  --  3.9  CL 111  --  112*  CO2 21*  --  20*  GLUCOSE 110*  --  225*  BUN 42*  --  42*  CREATININE 3.56*  --  3.52*  CALCIUM 8.8*  --  8.1*  MG  --  2.1  --   AST 47*  --   --   ALT 54  --   --   ALKPHOS 106  --   --   BILITOT 0.7  --   --    ------------------------------------------------------------------------------------------------------------------ estimated creatinine clearance is 17.3 mL/min (by C-G formula based on Cr of 3.52). ------------------------------------------------------------------------------------------------------------------  Recent Labs  01/21/15 1145  HGBA1C 6.4*   ------------------------------------------------------------------------------------------------------------------ No results for input(s): CHOL, HDL, LDLCALC, TRIG, CHOLHDL, LDLDIRECT in the last 72 hours. ------------------------------------------------------------------------------------------------------------------  Recent Labs  01/21/15 1145  TSH 1.018   ------------------------------------------------------------------------------------------------------------------  Recent Labs  01/21/15 1145  VITAMINB12 1245*  FERRITIN 135  TIBC 211*  IRON 29    Coagulation profile No results for input(s): INR, PROTIME in the last 168 hours.  No results for input(s): DDIMER in the last 72 hours.  Cardiac Enzymes  Recent Labs Lab 01/21/15 1145  01/21/15 1831 01/21/15 2329  TROPONINI 0.03 0.03 0.03   ------------------------------------------------------------------------------------------------------------------ Invalid input(s): POCBNP   Time Spent in minutes  35   Bram Hottel K M.D on 01/22/2015 at 11:02 AM  Between 7am to 7pm - Pager - [phone]  After 7pm go to [email or web address] - password [facility]  [facility] -  Office  [phone]

## 2015-01-22 NOTE — Plan of Care (Signed)
Problem: Phase I Progression Outcomes Goal: EF % per last Echo/documented,Core Reminder form on chart Outcome: Completed/Met Date Met:  01/22/15 EF 55-60%(November 18, 2014)     

## 2015-01-22 NOTE — Progress Notes (Signed)
Utilization review completed. Shabree Tebbetts, RN, BSN. 

## 2015-01-22 NOTE — Clinical Documentation Improvement (Signed)
Hospitalist  Can the diagnosis of anemia be further specified?    Acute on Chronic Blood Loss Anemia, including the suspected or known cause  Acute Blood Loss Anemia, including the suspected or known cause  Acute on Chronic Blood Loss Anemia on Normocytic Anemia, including the suspected or known cause  Chronic Anemia, including the suspected or known cause  Anemia of chronic disease, including the associated chronic disease state  Other  Clinically Undetermined  Document any associated diagnoses/conditions.  Supporting Information: Acute on chronic normocytic anemia per 8/28 progress notes.   H/H: 8/29:  6.6/21.4. 8/27:  7.6/24.1.  Please exercise your independent, professional judgment when responding. A specific answer is not anticipated or expected.   Thank You,  [place]

## 2015-01-22 NOTE — Progress Notes (Signed)
Nutrition Brief Note  Patient identified on the Malnutrition Screening Tool (MST) Report  Wt Readings from Last 15 Encounters:  01/21/15 184 lb 4.8 oz (83.598 kg)  12/01/14 185 lb (83.915 kg)  11/23/14 180 lb (81.647 kg)  11/22/14 192 lb 12.8 oz (87.454 kg)    Body mass index is 31.62 kg/(m^2). Patient meets criteria for class I obesity based on current BMI. Usual body weight reported to be ~188 lbs.   Current diet order is hear healthy/carbohydrate modified, patient is consuming approximately 100% of meals at this time. Labs and medications reviewed.   No nutrition interventions warranted at this time. If nutrition issues arise, please consult RD.   Corrin Parker, MS, RD, LDN Pager # [phone] After hours/ weekend pager # [phone]

## 2015-01-22 NOTE — Consult Note (Signed)
Reason for Consult: Charcot collapse left ankle status post open reduction internal fixation of the left ankle Referring Physician: Dr. Genia Hotter Robin [name] is an 64 y.o. female.  HPI: Patient is a 64 year old woman with diabetic insensate neuropathy who underwent open reduction internal fixation of the left ankle. She previously had undergone closed reduction for the right ankle. Due to patient's diabetic insensate neuropathy she has developed a Charcot collapse of the left ankle.  Past Medical History  Diagnosis Date  . Coronary artery disease   . Bipolar disorder   . Anemia   . History of blood transfusion   . DM type 2, uncontrolled, with renal complications   . Benign hypertension     Past Surgical History  Procedure Laterality Date  . Cardiac catheterization      2 stent   . Av fistula placement Left may-2016    done at [facility]  . Tonsillectomy    . Abdominal hysterectomy    . Cholecystectomy    . Ankle closed reduction N/A 11/17/2014    Procedure: CLOSED REDUCTION ANKLE;  Surgeon: Earlie Server, MD;  Location: [facility];  Service: Orthopedics;  Laterality: N/A;  . Orif ankle fracture Left 11/20/2014    Procedure: OPEN REDUCTION INTERNAL FIXATION (ORIF) ANKLE FRACTURE;  Surgeon: Renette Butters, MD;  Location: [facility];  Service: Orthopedics;  Laterality: Left;  . Coronary stent placement      History reviewed. No pertinent family history.  Social History:  reports that she has been smoking Cigarettes.  She has been smoking about 0.00 packs per day. She does not have any smokeless tobacco history on file. She reports that she does not drink alcohol or use illicit drugs.  Allergies:  Allergies  Allergen Reactions  . Oxycodone Other (See Comments)    Makes patient lethargic, sleeping for few days, altered mental status    Medications: I have reviewed the patient's current medications.  Results for orders placed or performed during the hospital encounter of 01/20/15 (from  the past 48 hour(s))  CBC with Differential     Status: Abnormal   Collection Time: 01/20/15  9:40 PM  Result Value Ref Range   WBC 7.8 4.0 - 10.5 K/uL   RBC 2.61 (L) 3.87 - 5.11 MIL/uL   Hemoglobin 7.6 (L) 12.0 - 15.0 g/dL   HCT 24.1 (L) 36.0 - 46.0 %   MCV 92.3 78.0 - 100.0 fL   MCH 29.1 26.0 - 34.0 pg   MCHC 31.5 30.0 - 36.0 g/dL   RDW 18.4 (H) 11.5 - 15.5 %   Platelets 269 150 - 400 K/uL   Neutrophils Relative % 72 43 - 77 %   Neutro Abs 5.7 1.7 - 7.7 K/uL   Lymphocytes Relative 17 12 - 46 %   Lymphs Abs 1.3 0.7 - 4.0 K/uL   Monocytes Relative 9 3 - 12 %   Monocytes Absolute 0.7 0.1 - 1.0 K/uL   Eosinophils Relative 2 0 - 5 %   Eosinophils Absolute 0.1 0.0 - 0.7 K/uL   Basophils Relative 0 0 - 1 %   Basophils Absolute 0.0 0.0 - 0.1 K/uL  Comprehensive metabolic panel     Status: Abnormal   Collection Time: 01/20/15  9:40 PM  Result Value Ref Range   Sodium 141 135 - 145 mmol/L   Potassium 3.9 3.5 - 5.1 mmol/L   Chloride 111 101 - 111 mmol/L   CO2 21 (L) 22 - 32 mmol/L   Glucose,  Bld 110 (H) 65 - 99 mg/dL   BUN 42 (H) 6 - 20 mg/dL   Creatinine, Ser 3.56 (H) 0.44 - 1.00 mg/dL   Calcium 8.8 (L) 8.9 - 10.3 mg/dL   Total Protein 6.4 (L) 6.5 - 8.1 g/dL   Albumin 3.2 (L) 3.5 - 5.0 g/dL   AST 47 (H) 15 - 41 U/L   ALT 54 14 - 54 U/L   Alkaline Phosphatase 106 38 - 126 U/L   Total Bilirubin 0.7 0.3 - 1.2 mg/dL   GFR calc non Af Amer 13 (L) >60 mL/min   GFR calc Af Amer 15 (L) >60 mL/min    Comment: (NOTE) The eGFR has been calculated using the CKD EPI equation. This calculation has not been validated in all clinical situations. eGFR's persistently <60 mL/min signify possible Chronic Kidney Disease.    Anion gap 9 5 - 15  Brain natriuretic peptide     Status: Abnormal   Collection Time: 01/21/15  1:00 AM  Result Value Ref Range   B Natriuretic Peptide 1500.3 (H) 0.0 - 100.0 pg/mL  Troponin I     Status: Abnormal   Collection Time: 01/21/15  1:00 AM  Result Value Ref  Range   Troponin I 0.04 (H) <0.031 ng/mL    Comment:        PERSISTENTLY INCREASED TROPONIN VALUES IN THE RANGE OF 0.04-0.49 ng/mL CAN BE SEEN IN:       -UNSTABLE ANGINA       -CONGESTIVE HEART FAILURE       -MYOCARDITIS       -CHEST TRAUMA       -ARRYHTHMIAS       -LATE PRESENTING MYOCARDIAL INFARCTION       -COPD   CLINICAL FOLLOW-UP RECOMMENDED.   Urinalysis, Routine w reflex microscopic (not at [name])     Status: Abnormal   Collection Time: 01/21/15  4:16 AM  Result Value Ref Range   Color, Urine YELLOW YELLOW   APPearance CLEAR CLEAR   Specific Gravity, Urine 1.015 1.005 - 1.030   pH 5.0 5.0 - 8.0   Glucose, UA NEGATIVE NEGATIVE mg/dL   Hgb urine dipstick NEGATIVE NEGATIVE   Bilirubin Urine NEGATIVE NEGATIVE   Ketones, ur NEGATIVE NEGATIVE mg/dL   Protein, ur 100 (A) NEGATIVE mg/dL   Urobilinogen, UA 0.2 0.0 - 1.0 mg/dL   Nitrite NEGATIVE NEGATIVE   Leukocytes, UA NEGATIVE NEGATIVE  Urine rapid drug screen (hosp performed)     Status: None   Collection Time: 01/21/15  4:16 AM  Result Value Ref Range   Opiates NONE DETECTED NONE DETECTED   Cocaine NONE DETECTED NONE DETECTED   Benzodiazepines NONE DETECTED NONE DETECTED   Amphetamines NONE DETECTED NONE DETECTED   Tetrahydrocannabinol NONE DETECTED NONE DETECTED   Barbiturates NONE DETECTED NONE DETECTED    Comment:        DRUG SCREEN FOR MEDICAL PURPOSES ONLY.  IF CONFIRMATION IS NEEDED FOR ANY PURPOSE, NOTIFY LAB WITHIN 5 DAYS.        LOWEST DETECTABLE LIMITS FOR URINE DRUG SCREEN Drug Class       Cutoff (ng/mL) Amphetamine      1000 Barbiturate      200 Benzodiazepine   245 Tricyclics       809 Opiates          300 Cocaine          300 THC              50  Urine microscopic-add on     Status: Abnormal   Collection Time: 01/21/15  4:16 AM  Result Value Ref Range   Squamous Epithelial / LPF RARE RARE   WBC, UA 0-2 <3 WBC/hpf   RBC / HPF 0-2 <3 RBC/hpf   Casts HYALINE CASTS (A) NEGATIVE   Urine-Other  LESS THAN 10 mL OF URINE SUBMITTED   CBG monitoring, ED     Status: Abnormal   Collection Time: 01/21/15  4:25 AM  Result Value Ref Range   Glucose-Capillary 134 (H) 65 - 99 mg/dL  MRSA PCR Screening     Status: None   Collection Time: 01/21/15  6:52 AM  Result Value Ref Range   MRSA by PCR NEGATIVE NEGATIVE    Comment:        The GeneXpert MRSA Assay (FDA approved for NASAL specimens only), is one component of a comprehensive MRSA colonization surveillance program. It is not intended to diagnose MRSA infection nor to guide or monitor treatment for MRSA infections.   Glucose, capillary     Status: Abnormal   Collection Time: 01/21/15  8:04 AM  Result Value Ref Range   Glucose-Capillary 141 (H) 65 - 99 mg/dL   Comment 1 Capillary Specimen   Glucose, capillary     Status: Abnormal   Collection Time: 01/21/15 11:44 AM  Result Value Ref Range   Glucose-Capillary 301 (H) 65 - 99 mg/dL  Troponin I     Status: None   Collection Time: 01/21/15 11:45 AM  Result Value Ref Range   Troponin I 0.03 <0.031 ng/mL    Comment:        NO INDICATION OF MYOCARDIAL INJURY.   TSH     Status: None   Collection Time: 01/21/15 11:45 AM  Result Value Ref Range   TSH 1.018 0.350 - 4.500 uIU/mL  Magnesium     Status: None   Collection Time: 01/21/15 11:45 AM  Result Value Ref Range   Magnesium 2.1 1.7 - 2.4 mg/dL  Hemoglobin A1c     Status: Abnormal   Collection Time: 01/21/15 11:45 AM  Result Value Ref Range   Hgb A1c MFr Bld 6.4 (H) 4.8 - 5.6 %    Comment: (NOTE)         Pre-diabetes: 5.7 - 6.4         Diabetes: >6.4         Glycemic control for adults with diabetes: <7.0    Mean Plasma Glucose 137 mg/dL    Comment: (NOTE) Performed At: [facility] [place], [place] [id number] Lindon Romp MD [id number]   Type and screen     Status: None (Preliminary result)   Collection Time: 01/21/15 11:45 AM  Result Value Ref Range   ABO/RH(D) O POS    Antibody  Screen NEG    Sample Expiration 01/24/2015    Unit Number [id number]    Blood Component Type RED CELLS,LR    Unit division 00    Status of Unit ISSUED    Transfusion Status OK TO TRANSFUSE    Crossmatch Result Compatible    Unit Number [id number]    Blood Component Type RED CELLS,LR    Unit division 00    Status of Unit ISSUED    Transfusion Status OK TO TRANSFUSE    Crossmatch Result Compatible   Iron and TIBC     Status: Abnormal   Collection Time: 01/21/15 11:45 AM  Result Value Ref Range   Iron 29  28 - 170 ug/dL   TIBC 305 (L) 628 - 274 ug/dL   Saturation Ratios 14 10.4 - 31.8 %   UIBC 182 ug/dL  Ferritin     Status: None   Collection Time: 01/21/15 11:45 AM  Result Value Ref Range   Ferritin 135 11 - 307 ng/mL  Vitamin B12     Status: Abnormal   Collection Time: 01/21/15 11:45 AM  Result Value Ref Range   Vitamin B-12 1245 (H) 180 - 914 pg/mL    Comment: (NOTE) This assay is not validated for testing neonatal or myeloproliferative syndrome specimens for Vitamin B12 levels.   ABO/Rh     Status: None   Collection Time: 01/21/15 11:45 AM  Result Value Ref Range   ABO/RH(D) O POS   Glucose, capillary     Status: Abnormal   Collection Time: 01/21/15  5:42 PM  Result Value Ref Range   Glucose-Capillary 106 (H) 65 - 99 mg/dL   Comment 1 Notify RN    Comment 2 Document in Chart   Troponin I     Status: None   Collection Time: 01/21/15  6:31 PM  Result Value Ref Range   Troponin I 0.03 <0.031 ng/mL    Comment:        NO INDICATION OF MYOCARDIAL INJURY.   Glucose, capillary     Status: Abnormal   Collection Time: 01/21/15  9:14 PM  Result Value Ref Range   Glucose-Capillary 236 (H) 65 - 99 mg/dL   Comment 1 Notify RN   Troponin I     Status: None   Collection Time: 01/21/15 11:29 PM  Result Value Ref Range   Troponin I 0.03 <0.031 ng/mL    Comment:        NO INDICATION OF MYOCARDIAL INJURY.   CBC     Status: Abnormal   Collection Time: 01/22/15  3:18  AM  Result Value Ref Range   WBC 6.6 4.0 - 10.5 K/uL   RBC 2.28 (L) 3.87 - 5.11 MIL/uL   Hemoglobin 6.6 (LL) 12.0 - 15.0 g/dL    Comment: REPEATED TO VERIFY CRITICAL RESULT CALLED TO, READ BACK BY AND VERIFIED WITH: FUTRELL,M RN 0402 01/22/15 TEETERN    HCT 21.4 (L) 36.0 - 46.0 %   MCV 93.9 78.0 - 100.0 fL   MCH 28.9 26.0 - 34.0 pg   MCHC 30.8 30.0 - 36.0 g/dL   RDW 07.1 (H) 84.5 - 18.6 %   Platelets 248 150 - 400 K/uL  Basic metabolic panel     Status: Abnormal   Collection Time: 01/22/15  3:18 AM  Result Value Ref Range   Sodium 139 135 - 145 mmol/L   Potassium 3.9 3.5 - 5.1 mmol/L   Chloride 112 (H) 101 - 111 mmol/L   CO2 20 (L) 22 - 32 mmol/L   Glucose, Bld 225 (H) 65 - 99 mg/dL   BUN 42 (H) 6 - 20 mg/dL   Creatinine, Ser 2.10 (H) 0.44 - 1.00 mg/dL   Calcium 8.1 (L) 8.9 - 10.3 mg/dL   GFR calc non Af Amer 13 (L) >60 mL/min   GFR calc Af Amer 15 (L) >60 mL/min    Comment: (NOTE) The eGFR has been calculated using the CKD EPI equation. This calculation has not been validated in all clinical situations. eGFR's persistently <60 mL/min signify possible Chronic Kidney Disease.    Anion gap 7 5 - 15  Prepare RBC     Status: None  Collection Time: 01/22/15  4:38 AM  Result Value Ref Range   Order Confirmation ORDER PROCESSED BY BLOOD BANK   Glucose, capillary     Status: Abnormal   Collection Time: 01/22/15  5:44 AM  Result Value Ref Range   Glucose-Capillary 151 (H) 65 - 99 mg/dL   Comment 1 Notify RN    Comment 2 Document in Chart   Glucose, capillary     Status: Abnormal   Collection Time: 01/22/15  8:17 AM  Result Value Ref Range   Glucose-Capillary 110 (H) 65 - 99 mg/dL  Prepare RBC     Status: None   Collection Time: 01/22/15 11:10 AM  Result Value Ref Range   Order Confirmation ORDER PROCESSED BY BLOOD BANK   Glucose, capillary     Status: None   Collection Time: 01/22/15 11:43 AM  Result Value Ref Range   Glucose-Capillary 81 65 - 99 mg/dL   Comment 1  Notify RN   Lactate dehydrogenase     Status: Abnormal   Collection Time: 01/22/15 12:08 PM  Result Value Ref Range   LDH 244 (H) 98 - 192 U/L  Glucose, capillary     Status: Abnormal   Collection Time: 01/22/15  4:56 PM  Result Value Ref Range   Glucose-Capillary 59 (L) 65 - 99 mg/dL   Comment 1 Notify RN   Glucose, capillary     Status: None   Collection Time: 01/22/15  5:35 PM  Result Value Ref Range   Glucose-Capillary 97 65 - 99 mg/dL    Ct Head Wo Contrast  01/21/2015   CLINICAL DATA:  Disoriented and altered mental status, intermittent confusion for 1 week with facial swelling and leg swelling. History of diabetes, hypertension.  EXAM: CT HEAD WITHOUT CONTRAST  CT MAXILLOFACIAL WITHOUT CONTRAST  TECHNIQUE: Multidetector CT imaging of the head and maxillofacial structures were performed using the standard protocol without intravenous contrast. Multiplanar CT image reconstructions of the maxillofacial structures were also generated.  COMPARISON:  CT head November 18, 2014  FINDINGS: CT HEAD FINDINGS  Stable moderate ventriculomegaly, with commensurate enlargement of cerebral sulci and cerebellar folia. No intraparenchymal hemorrhage, mass effect nor midline shift. Mild supratentorial white matter hypodensities are within normal range for patient's age and though non-specific suggest sequelae of chronic small vessel ischemic disease. No acute large vascular territory infarcts.  No abnormal extra-axial fluid collections. Basal cisterns are patent. Moderate to severe calcific atherosclerosis of the carotid siphons, to lesser extent included vertebral arteries. No skull fracture.  CT MAXILLOFACIAL FINDINGS  The mandible is intact. The condyles are located. No acute facial fracture. Torus palatini. Multiple absent teeth. Nasal septum is midline. Paranasal sinuses and mastoid air cells are well aerated. No destructive bony lesions.  Ocular globes intact, lenses are located. Normal appearance optic nerve  sheath complexes. Normal appearance the extraocular muscles. Preservation orbital fat.  Moderate calcific atherosclerosis of the LEFT greater the RIGHT carotid bulbs. Mild subcutaneous fat inflammation without focal hematoma, focal fluid collection or subcutaneous gas.  IMPRESSION: CT HEAD: No acute intracranial process.  Stable appearance of the head including moderate parenchymal brain volume loss, advanced for age.  CT MAXILLOFACIAL: No acute facial fracture.  Mild subcutaneous fat stranding can be seen with cellulitis or inflammation.   Electronically Signed   By: Awilda Metro M.D.   On: 01/21/2015 00:32   Dg Chest Port 1 View  01/21/2015   CLINICAL DATA:  Shortness of breath  EXAM: PORTABLE CHEST - 1 VIEW  COMPARISON:  11/17/2014  FINDINGS: Cardiac enlargement with mild pulmonary vascular congestion. Hazy perihilar infiltration suggests perihilar edema. No blunting of costophrenic angles. No pneumothorax. Mediastinal contours appear intact.  IMPRESSION: Cardiac enlargement with mild pulmonary vascular congestion and perihilar edema.   Electronically Signed   By: Lucienne Capers M.D.   On: 01/21/2015 00:35   Ct Maxillofacial Wo Cm  01/21/2015   CLINICAL DATA:  Disoriented and altered mental status, intermittent confusion for 1 week with facial swelling and leg swelling. History of diabetes, hypertension.  EXAM: CT HEAD WITHOUT CONTRAST  CT MAXILLOFACIAL WITHOUT CONTRAST  TECHNIQUE: Multidetector CT imaging of the head and maxillofacial structures were performed using the standard protocol without intravenous contrast. Multiplanar CT image reconstructions of the maxillofacial structures were also generated.  COMPARISON:  CT head November 18, 2014  FINDINGS: CT HEAD FINDINGS  Stable moderate ventriculomegaly, with commensurate enlargement of cerebral sulci and cerebellar folia. No intraparenchymal hemorrhage, mass effect nor midline shift. Mild supratentorial white matter hypodensities are within normal  range for patient's age and though non-specific suggest sequelae of chronic small vessel ischemic disease. No acute large vascular territory infarcts.  No abnormal extra-axial fluid collections. Basal cisterns are patent. Moderate to severe calcific atherosclerosis of the carotid siphons, to lesser extent included vertebral arteries. No skull fracture.  CT MAXILLOFACIAL FINDINGS  The mandible is intact. The condyles are located. No acute facial fracture. Torus palatini. Multiple absent teeth. Nasal septum is midline. Paranasal sinuses and mastoid air cells are well aerated. No destructive bony lesions.  Ocular globes intact, lenses are located. Normal appearance optic nerve sheath complexes. Normal appearance the extraocular muscles. Preservation orbital fat.  Moderate calcific atherosclerosis of the LEFT greater the RIGHT carotid bulbs. Mild subcutaneous fat inflammation without focal hematoma, focal fluid collection or subcutaneous gas.  IMPRESSION: CT HEAD: No acute intracranial process.  Stable appearance of the head including moderate parenchymal brain volume loss, advanced for age.  CT MAXILLOFACIAL: No acute facial fracture.  Mild subcutaneous fat stranding can be seen with cellulitis or inflammation.   Electronically Signed   By: Elon Alas M.D.   On: 01/21/2015 00:32    Review of Systems  All other systems reviewed and are negative.  Blood pressure 148/69, pulse 75, temperature 98.7 F (37.1 C), temperature source Oral, resp. rate 16, height $RemoveBe'5\' 4"'QaIjDHjEj$  (1.626 m), weight 83.598 kg (184 lb 4.8 oz), SpO2 100 %. Physical Exam On examination patient's right foot and ankle is stable and plantar-grade no complicating features no redness no cellulitis no signs of any active infection or Charcot process in the right foot or ankle. Examination the left ankle patient has an unstable left ankle joint with swelling and warmth. She has good pulses. Clinical findings are consistent with Charcot arthropathy of  the left ankle. Assessment/Plan: Assessment diabetic insensate neuropathy with Charcot collapse left ankle status post open reduction internal fixation.  Plan: Patient states she's currently being worked up for a cardiac event. Patient needs to wear the fracture boot on the left I will follow-up in the office once she is discharged and once she is stable from a cardiac standpoint we will plan for tibial calcaneal fusion on the left.  Anelle Parlow V 01/22/2015, 6:56 PM

## 2015-01-22 NOTE — Progress Notes (Signed)
1520 introduced self to pt as incoming nurse, 3p - 7p.  Call bell at reach.  Instructed to call for assistance as needed.  Verbalized understanding.  Karie Kirks, Therapist, sports.

## 2015-01-22 NOTE — Progress Notes (Signed)
Pt iv line came out while receiving 2nd unit of blood.  Able to get 1/2 of the 2nd unit.  Returned rest of blood to blood bank as it was over 4 hours since it was issued and as was instructed, received by Cherlyn Cushing.  Dr. Arville Go aware.  No new order.  Karie Kirks, Therapist, sports.

## 2015-01-22 NOTE — Progress Notes (Signed)
Second unit of PRBCs transfusing

## 2015-01-23 ENCOUNTER — Telehealth: Payer: Self-pay | Admitting: Hematology

## 2015-01-23 LAB — CBC
HEMATOCRIT: 28.1 % — AB (ref 36.0–46.0)
HEMOGLOBIN: 8.9 g/dL — AB (ref 12.0–15.0)
MCH: 29.7 pg (ref 26.0–34.0)
MCHC: 31.7 g/dL (ref 30.0–36.0)
MCV: 93.7 fL (ref 78.0–100.0)
Platelets: 254 10*3/uL (ref 150–400)
RBC: 3 MIL/uL — AB (ref 3.87–5.11)
RDW: 17.6 % — ABNORMAL HIGH (ref 11.5–15.5)
WBC: 6.1 10*3/uL (ref 4.0–10.5)

## 2015-01-23 LAB — TYPE AND SCREEN
ABO/RH(D): O POS
ANTIBODY SCREEN: NEGATIVE
UNIT DIVISION: 0
Unit division: 0

## 2015-01-23 LAB — GLUCOSE, CAPILLARY
GLUCOSE-CAPILLARY: 66 mg/dL (ref 65–99)
Glucose-Capillary: 146 mg/dL — ABNORMAL HIGH (ref 65–99)

## 2015-01-23 MED ORDER — INSULIN GLARGINE 100 UNIT/ML ~~LOC~~ SOLN
16.0000 [IU] | Freq: Two times a day (BID) | SUBCUTANEOUS | Status: DC
Start: 2015-01-23 — End: 2015-01-23
  Administered 2015-01-23: 16 [IU] via SUBCUTANEOUS
  Filled 2015-01-23 (×2): qty 0.16

## 2015-01-23 MED ORDER — INSULIN ASPART 100 UNIT/ML ~~LOC~~ SOLN: 0.0000 [IU] | Freq: Three times a day (TID) | SUBCUTANEOUS | Status: DC

## 2015-01-23 NOTE — Progress Notes (Signed)
Inpatient Diabetes Program Recommendations  AACE/ADA: New Consensus Statement on Inpatient Glycemic Control (2013)  Target Ranges:  Prepandial:   less than 140 mg/dL      Peak postprandial:   less than 180 mg/dL (1-2 hours)      Critically ill patients:  140 - 180 mg/dL   Results for MARNETTE, HAMEISTER (MRN [id number]) as of 01/23/2015 08:14  Ref. Range 01/22/2015 05:44 01/22/2015 08:17 01/22/2015 11:43 01/22/2015 16:56 01/22/2015 17:35 01/22/2015 21:25 01/23/2015 06:36  Glucose-Capillary Latest Ref Range: 65-99 mg/dL 151 (H) 110 (H) 81 59 (L) 97 160 (H) 66   Current orders for Inpatient glycemic control: Lantus 20 units BID, Novolog 0-15 units TID with meals  Inpatient Diabetes Program Recommendations Insulin - Basal: CBGs ranged from 59-160 mg/dl on 8/29 and fasting glucose is 66 mg/dl this morning. Please consider decreasing Lantus to 18 units BID.  Thanks, Barnie Alderman, RN, MSN, CCRN, CDE Diabetes Coordinator Inpatient Diabetes Program [phone] (Team Pager from [place] to [place]) [phone] ([facility] office) [phone] [facility] office) [phone] [facility] office)

## 2015-01-23 NOTE — Discharge Instructions (Signed)
Follow with Primary MD [name] in 7 days   Get CBC, CMP, 2 view Chest X ray checked  by Primary MD next visit.    Activity: As tolerated with Full fall precautions use walker/cane & assistance as needed   Disposition Home     Diet: Heart Healthy Low carb.  Accuchecks 4 times/day, Once in AM empty stomach and then before each meal. Log in all results and show them to your Prim.MD in 3 days. If any glucose reading is under 80 or above 300 call your Prim MD immidiately. Follow Low glucose instructions for glucose under 80 as instructed.   For Heart failure patients - Check your Weight same time everyday, if you gain over 2 pounds, or you develop in leg swelling, experience more shortness of breath or chest pain, call your Primary MD immediately. Follow Cardiac Low Salt Diet and 1.5 lit/day fluid restriction.   On your next visit with your primary care physician please Get Medicines reviewed and adjusted.   Please request your Prim.MD to go over all Hospital Tests and Procedure/Radiological results at the follow up, please get all Hospital records sent to your Prim MD by signing hospital release before you go home.   If you experience worsening of your admission symptoms, develop shortness of breath, life threatening emergency, suicidal or homicidal thoughts you must seek medical attention immediately by calling 911 or calling your MD immediately  if symptoms less severe.  You Must read complete instructions/literature along with all the possible adverse reactions/side effects for all the Medicines you take and that have been prescribed to you. Take any new Medicines after you have completely understood and accpet all the possible adverse reactions/side effects.   Do not drive, operating heavy machinery, perform activities at heights, swimming or participation in water activities or provide baby sitting services if your were admitted for syncope or siezures until you have  seen by Primary MD or a Neurologist and advised to do so again.  Do not drive when taking Pain medications.    Do not take more than prescribed Pain, Sleep and Anxiety Medications  Special Instructions: If you have smoked or chewed Tobacco  in the last 2 yrs please stop smoking, stop any regular Alcohol  and or any Recreational drug use.  Wear Seat belts while driving.   Please note  You were cared for by a hospitalist during your hospital stay. If you have any questions about your discharge medications or the care you received while you were in the hospital after you are discharged, you can call the unit and asked to speak with the hospitalist on call if the hospitalist that took care of you is not available. Once you are discharged, your primary care physician will handle any further medical issues. Please note that NO REFILLS for any discharge medications will be authorized once you are discharged, as it is imperative that you return to your primary care physician (or establish a relationship with a primary care physician if you do not have one) for your aftercare needs so that they can reassess your need for medications and monitor your lab values.

## 2015-01-23 NOTE — Care Management Note (Signed)
Case Management Note  Patient Details  Name: Robin [name] MRN: [id number] Date of Birth: 05-27-1950  Subjective/Objective:        Admitted with CHF          Action/Plan: Patient stated that she is active with Arville Go for [facility] services as prior to admission. CM called [facility] Go to make them aware of discharge home for today. No DME needed, patient has a walker, rolator, wheelchair at home. She has Multimedia programmer with Medicare and the [place]. Patient goes to the [facility] for medical care and all of her prescriptions arrive through the mail. ( She was in the [employer] for 11 years).   Expected Discharge Date:  01/23/15               Expected Discharge Plan:  [place]  Discharge planning Services  CM Consult Choice offered to:  Patient  HH Arranged:  RN, PT, Nurse's Aide HH Agency:  [facility]  Status of Service:  In process, will continue to follow   Sherrilyn Rist [id number] 01/23/2015, 10:54 AM

## 2015-01-23 NOTE — Telephone Encounter (Signed)
new patient appt-s/w Alinda Sierras on [address] and gave np appt for 09/07 @ 9:30 w/Dr. Irene Limbo.

## 2015-01-23 NOTE — Evaluation (Signed)
Occupational Therapy Evaluation Patient Details Name: Robin [name] MRN: [id number] DOB: [birth year] Today's Date: 01/23/2015    History of Present Illness 64 y/o female brought in by daughter due to SOB, increase LE swelling and confusion. Pt with left ankle fx june 2016, bipolar, CAD, DM   Clinical Impression   Patient presenting with deconditioning. Patient mod I PTA. Patient currently functioning at an overall supervision level. Patient will benefit from acute OT to increase overall independence in the areas of ADLs, functional mobility, and overall safety in order to safely discharge home.   Pt reports that she was receiving HHOT PTA, therefore recommending continued HHOT to maximize independence as patient lives alone. Pt reports she does have family (her children) that come to check on her at least 3 times per day.   According to Dr. Jess Barters note in chart, pt to wear CAM boot. No weight bears status in chart. According to pt, she is NWB without CAM boot. Pt did not adhere to NWB during sit<>stand without boot, she was somewhat impulsive and required cueing to sit back down to don boot prior to mobility.     Follow Up Recommendations  Home health OT;Supervision/Assistance - 24 hour    Equipment Recommendations  None recommended by OT    Recommendations for Other Services  None at this time   Precautions / Restrictions Precautions Precautions: Fall Restrictions Weight Bearing Restrictions: Yes LLE Weight Bearing: Non weight bearing Other Position/Activity Restrictions: Can ambulate with CAM boot only    Mobility Bed Mobility Overal bed mobility: Modified Independent Transfers Overall transfer level: Needs assistance   Transfers: Sit to/from Stand Sit to Stand: Supervision         General transfer comment: cues for hand placement, cues for NWB without CAM boot > LLE and cues to don cam boot prior to any transfer     Balance Overall balance assessment: Needs  assistance Sitting-balance support: No upper extremity supported;Single extremity supported Sitting balance-Leahy Scale: Good     Standing balance support: Bilateral upper extremity supported;During functional activity Standing balance-Leahy Scale: Fair    ADL Overall ADL's : Needs assistance/impaired Eating/Feeding: Set up;Sitting   Grooming: Supervision/safety;Standing   Upper Body Bathing: Set up;Sitting   Lower Body Bathing: Supervison/ safety;Sit to/from stand   Upper Body Dressing : Set up;Sitting   Lower Body Dressing: Supervision/safety;Sit to/from stand General ADL Comments: Pt overall supervision for safety and requires mod verbal cueing for safety and to adhere to weight bearing status.       Pertinent Vitals/Pain Pain Assessment: No/denies pain     Hand Dominance Right   Extremity/Trunk Assessment Upper Extremity Assessment Upper Extremity Assessment: Overall WFL for tasks assessed   Lower Extremity Assessment Lower Extremity Assessment: Defer to PT evaluation   Cervical / Trunk Assessment Cervical / Trunk Assessment: Normal   Communication Communication Communication: No difficulties   Cognition Arousal/Alertness: Awake/alert Behavior During Therapy: Flat affect Overall Cognitive Status: Impaired/Different from baseline Area of Impairment: Memory;Safety/judgement     Memory: Decreased short-term memory   Safety/Judgement: Decreased awareness of safety     General Comments: Pt explained that she was NWB>LLE without CAM boot, pt did not adhere to this during functional activity              Home Living Family/patient expects to be discharged to:: Private residence Living Arrangements: Alone Available Help at Discharge: Family;Available PRN/intermittently (pt reports her children come over 3 X per day to help with meals) Type of Home: House  Home Access: Stairs to enter Entrance Stairs-Number of Steps: 3   Home Layout: Multi-level;Able to  live on main level with bedroom/bathroom     Bathroom Shower/Tub: Teacher, early years/pre: Standard     Home Equipment: Environmental consultant - 2 wheels;Walker - 4 wheels;Wheelchair - [employer];Tub bench          Prior Functioning/Environment Level of Independence: Independent with assistive device(s);Needs assistance  Gait / Transfers Assistance Needed: mod I with RW for limited distance since Left ankle sx ADL's / Homemaking Assistance Needed: family has been doing the shopping, cooking and cleaning. pt sponge bathes and toilets independently     OT Diagnosis: Generalized weakness   OT Problem List: Decreased activity tolerance;Impaired balance (sitting and/or standing);Decreased safety awareness;Decreased knowledge of use of DME or AE;Decreased knowledge of precautions   OT Treatment/Interventions: Self-care/ADL training;Therapeutic exercise;Energy conservation;DME and/or AE instruction;Therapeutic activities;Patient/family education;Balance training    OT Goals(Current goals can be found in the care plan section) Acute Rehab OT Goals Patient Stated Goal: return home OT Goal Formulation: With patient Time For Goal Achievement: 02/06/15 Potential to Achieve Goals: Good ADL Goals Pt Will Perform Lower Body Bathing: with modified independence;sit to/from stand Pt Will Perform Lower Body Dressing: with modified independence;sit to/from stand Pt Will Transfer to Toilet: with modified independence;ambulating;bedside commode Pt Will Perform Tub/Shower Transfer: Tub transfer;tub bench;rolling walker;ambulating;with modified independence Additional ADL Goal #1: Pt will verbalize at least 3 energy conservation techniques   OT Frequency: Min 2X/week   Barriers to D/C: Decreased caregiver support   End of Session Equipment Utilized During Treatment: Rolling walker;Other (comment) (CAM boot > LLE)  Activity Tolerance: Patient tolerated treatment well Patient left: in  chair;with call bell/phone within reach   Time: [id number] OT Time Calculation (min): 30 min Charges:  OT General Charges $OT Visit: 1 Procedure OT Evaluation $Initial OT Evaluation Tier I: 1 Procedure OT Treatments $Self Care/Home Management : 8-22 mins  Cliffie Gingras , MS, OTR/L, CLT Pager: [phone]  01/23/2015, 10:06 AM

## 2015-01-23 NOTE — Care Management Important Message (Signed)
Important Message  Patient Details  Name: Robin [name] MRN: [id number] Date of Birth: 07-06-1950   Medicare Important Message Given:  Yes-second notification given    Pricilla Handler 01/23/2015, 12:10 PM

## 2015-01-23 NOTE — Progress Notes (Signed)
Discharge instructions given to pt and daughter boh understood. Wheeled to lobby by Psychologist, occupational

## 2015-01-23 NOTE — Progress Notes (Signed)
Flu vaccine . Refused by pt. Claimed had received alredy

## 2015-01-23 NOTE — Discharge Summary (Signed)
Robin [name], is a 64 y.o. female  DOB Feb 20, 1951  MRN [id number].  Admission date:  01/20/2015  Admitting Physician  No admitting provider for patient encounter.  Discharge Date:  01/23/2015   Primary MD  [facility]  Recommendations for primary care physician for things to follow:   Check CBC, BMP in 7-10 days. Outpatient workup for anemia.   Admission Diagnosis  Confusion [R41.0] Ventricular tachycardia [I47.2] Peripheral edema [R60.9] Chronic renal insufficiency, unspecified stage [N18.9] Congestive heart failure, unspecified congestive heart failure chronicity, unspecified congestive heart failure type [I50.9]   Discharge Diagnosis  Confusion [R41.0] Ventricular tachycardia [I47.2] Peripheral edema [R60.9] Chronic renal insufficiency, unspecified stage [N18.9] Congestive heart failure, unspecified congestive heart failure chronicity, unspecified congestive heart failure type [I50.9]     Principal Problem:   Acute on chronic diastolic heart failure Active Problems:   DM type 2, uncontrolled, with renal complications   Essential hypertension   Acute encephalopathy   CKD (chronic kidney disease)   Confusion   Anemia   Multiple falls   Anemia, chronic renal failure   Ventricular tachycardia   Chronic renal disease, stage 4, severely decreased glomerular filtration rate between 15-29 mL/min/1.73 square meter      Past Medical History  Diagnosis Date  . Coronary artery disease   . Bipolar disorder   . Anemia   . History of blood transfusion   . DM type 2, uncontrolled, with renal complications   . Benign hypertension     Past Surgical History  Procedure Laterality Date  . Cardiac catheterization      2 stent   . Av fistula placement Left may-2016    done at [facility]  .  Tonsillectomy    . Abdominal hysterectomy    . Cholecystectomy    . Ankle closed reduction N/A 11/17/2014    Procedure: CLOSED REDUCTION ANKLE;  Surgeon: Earlie Server, MD;  Location: [place];  Service: Orthopedics;  Laterality: N/A;  . Orif ankle fracture Left 11/20/2014    Procedure: OPEN REDUCTION INTERNAL FIXATION (ORIF) ANKLE FRACTURE;  Surgeon: Renette Butters, MD;  Location: [place];  Service: Orthopedics;  Laterality: Left;  . Coronary stent placement         HPI  from the history and physical done on the day of admission:   Robin [name] is a 64 y.o. female, with chronic kidney disease stage IV, diastolic dysfunction grade 2, bipolar affective disorder, diabetes mellitus, and recent bilateral ankle fractures in June 2016. She presented emergency department this morning with altered mental status. On my exam the patient is alert and able to give history. She states that Friday morning at approximately 12 AM she fell out of bed. She was found on the floor by her children who wanted to bring her to the hospital because she was confused. She refused their assistance at the time, but decided to come last night. She noted swelling particularly in her lower extremities bilaterally. She believes her confusion was due to recurrent falling. Her  daughter told the ED physician that she was concerned her mother had taken oxycodone as that has caused confusion in the past. UDS was negative. Unfortunately the daughter is not currently available to provide further history. The patient was discharged from [facility] rehabilitation on 7/26, and has been at home alone since. Her daughter brings her food each day, and she is able to dress and bathe herself. She has had several falls. She does not complain of SOB, CP, palpitations, fever, vomiting, dysuria, or diarrhea.   In the ER, BNP is 1500, Troponin is mildly elevated, creatinine is at baseline 3.56, hgb is down to 7.6 from 10.5 on 6/24. UDS is  clear, U/A is negative, CT head is negative, wbc is not elevated. CXR with mild pulmonary vascular congestion and perihilar edema      Hospital Course:     1. Encephalopathy. Could have been delirium with early onset dementia - Her mentation seems to be back to baseline, no focal deficits, head CT unremarkable. Supportive care and monitor. Minimize narcotics and benzodiazepine. Request PCP to do outpatient dementia evaluation. Mentation is back to normal here.   2. Anemia. Heme occult stool negative few days ago, iron panel is stable. Her TIBC was low suggesting anemia of chronic disease due to CK D, transfuse 2 units of packed RBC with stable posttransfusion H&H, now symptom-free will be discharged home request PCP to do outpatient anemia workup as appropriate. Most likely anemia of chronic disease. If needed one time outpatient hematology follow-up can be considered.   3. Essential hypertension. Continue present combination of Norvasc, Coreg, hydralazine along with Imdur.   4. Multiple falls at home with recent bilateral ankle fracture. Supportive care, boot to be worn in left leg when ambulating, in by PT. Home PT recommended and has been ordered. Have also ordered home RN and social work in case she needs to be placed in the future.   5. CK D stage V. Baseline creatinine close to 3.5. At baseline monitor.   6. Mild acute on Chronic grade 2 diastolic CHF last EF [id number] few months ago. Likely decompensated due to anemia, diuresed with IV Lasix and now compensated and will be placed back on her Bumex along with Coreg, hydralazine and Imdur. His PCP to monitor weight BMP and diuretic dose closely.   7. False-positive point-of-care troponin. Subsequent troponin in the lab negative. EKG nonacute. No chest pain. This was a false positive test no further workup.   8. DM type II. Continue home regimen upon discharge.   Discharge Condition: Stable  Follow UP  Follow-up Information     Follow up with DUDA,MARCUS V, MD In 1 week.   Specialty:  Orthopedic Surgery   Contact information:   [place] [address] [phone]       Follow up with [name]. Schedule an appointment as soon as possible for a visit in 1 week.   Specialty:  General Practice   Contact information:   [place] [address] [phone]       Follow up with [facility], NI, MD. Schedule an appointment as soon as possible for a visit in 1 week.   Specialty:  Hematology and Oncology   Why:  anemia   Contact information:   [place] [id number]        Consults obtained - Dr. Sharol Given  Diet and Activity recommendation: See Discharge Instructions below  Discharge Instructions  Discharge Instructions    Apply cam walker    Complete by:  As directed   Laterality:  Left  Wear cam boot at all times     Discharge instructions    Complete by:  As directed   Follow with Primary MD [facility] in 7 days   Get CBC, CMP, 2 view Chest X ray checked  by Primary MD next visit.    Activity: As tolerated with Full fall precautions use walker/cane & assistance as needed   Disposition Home     Diet: Heart Healthy Low carb.  Accuchecks 4 times/day, Once in AM empty stomach and then before each meal. Log in all results and show them to your Prim.MD in 3 days. If any glucose reading is under 80 or above 300 call your Prim MD immidiately. Follow Low glucose instructions for glucose under 80 as instructed.   For Heart failure patients - Check your Weight same time everyday, if you gain over 2 pounds, or you develop in leg swelling, experience more shortness of breath or chest pain, call your Primary MD immediately. Follow Cardiac Low Salt Diet and 1.5 lit/day fluid restriction.   On your next visit with your primary care physician please Get Medicines reviewed and adjusted.   Please request your  Prim.MD to go over all Hospital Tests and Procedure/Radiological results at the follow up, please get all Hospital records sent to your Prim MD by signing hospital release before you go home.   If you experience worsening of your admission symptoms, develop shortness of breath, life threatening emergency, suicidal or homicidal thoughts you must seek medical attention immediately by calling 911 or calling your MD immediately  if symptoms less severe.  You Must read complete instructions/literature along with all the possible adverse reactions/side effects for all the Medicines you take and that have been prescribed to you. Take any new Medicines after you have completely understood and accpet all the possible adverse reactions/side effects.   Do not drive, operating heavy machinery, perform activities at heights, swimming or participation in water activities or provide baby sitting services if your were admitted for syncope or siezures until you have seen by Primary MD or a Neurologist and advised to do so again.  Do not drive when taking Pain medications.    Do not take more than prescribed Pain, Sleep and Anxiety Medications  Special Instructions: If you have smoked or chewed Tobacco  in the last 2 yrs please stop smoking, stop any regular Alcohol  and or any Recreational drug use.  Wear Seat belts while driving.   Please note  You were cared for by a hospitalist during your hospital stay. If you have any questions about your discharge medications or the care you received while you were in the hospital after you are discharged, you can call the unit and asked to speak with the hospitalist on call if the hospitalist that took care of you is not available. Once you are discharged, your primary care physician will handle any further medical issues. Please note that NO REFILLS for any discharge medications will be authorized once you are discharged, as it is imperative that you return to your  primary care physician (or establish a relationship with a primary care physician if you do not have one) for your aftercare needs so that they can reassess your need for medications and monitor your lab values.     Increase activity slowly    Complete by:  As  directed      Non weight bearing    Complete by:  As directed   Laterality:  left  Extremity:  Lower             Discharge Medications       Medication List    TAKE these medications        albuterol 108 (90 BASE) MCG/ACT inhaler  Commonly known as:  PROVENTIL HFA;VENTOLIN HFA  Inhale 1-2 puffs into the lungs every 6 (six) hours as needed for wheezing or shortness of breath.     amLODipine 10 MG tablet  Commonly known as:  NORVASC  Take 10 mg by mouth daily.     aspirin 325 MG tablet  Take 1 tablet (325 mg total) by mouth daily.     benztropine 0.5 MG tablet  Commonly known as:  COGENTIN  Take 0.5 mg by mouth 2 (two) times daily.     bumetanide 2 MG tablet  Commonly known as:  BUMEX  Take 2 mg by mouth daily.     carvedilol 25 MG tablet  Commonly known as:  COREG  Take 25 mg by mouth 2 (two) times daily with a meal.     darbepoetin 40 MCG/0.4ML Soln injection  Commonly known as:  ARANESP  Inject 40 mcg into the skin every 14 (fourteen) days.     docusate sodium 100 MG capsule  Commonly known as:  COLACE  Take 1 capsule (100 mg total) by mouth 2 (two) times daily.     ferrous gluconate 324 MG tablet  Commonly known as:  FERGON  Take 324 mg by mouth daily with breakfast.     fluPHENAZine 1 MG tablet  Commonly known as:  PROLIXIN  Take 1 mg by mouth daily.     gabapentin 300 MG capsule  Commonly known as:  NEURONTIN  Take 300 mg by mouth 2 (two) times daily.     hydrALAZINE 100 MG tablet  Commonly known as:  APRESOLINE  Take 100 mg by mouth 2 (two) times daily.     HYDROcodone-acetaminophen 5-325 MG per tablet  Commonly known as:  NORCO/VICODIN  1 by mouth every 4-6 hours as needed DO NOT  EXCEED 4 GM OF APAP IN 24 HOURS     insulin aspart 100 UNIT/ML injection  Commonly known as:  novoLOG  Inject 7-15 Units into the skin 3 (three) times daily before meals. Sliding scale     insulin detemir 100 UNIT/ML injection  Commonly known as:  LEVEMIR  Inject 0.12 mLs (12 Units total) into the skin at bedtime.     isosorbide mononitrate 60 MG 24 hr tablet  Commonly known as:  IMDUR  Take 60 mg by mouth daily.     multivitamin with minerals tablet  Take 1 tablet by mouth daily.     ondansetron 4 MG tablet  Commonly known as:  ZOFRAN  Take 1 tablet (4 mg total) by mouth every 8 (eight) hours as needed for nausea or vomiting.     pantoprazole 20 MG tablet  Commonly known as:  PROTONIX  Take 20 mg by mouth daily.     QUEtiapine 100 MG tablet  Commonly known as:  SEROQUEL  Take 2 tablets (200 mg total) by mouth at bedtime.     traMADol 50 MG tablet  Commonly known as:  ULTRAM  Take 1 tablet (50 mg total) by mouth every 6 (six) hours as needed.        Major procedures  and Radiology Reports - PLEASE review detailed and final reports for all details, in brief -       Ct Head Wo Contrast  01/21/2015   CLINICAL DATA:  Disoriented and altered mental status, intermittent confusion for 1 week with facial swelling and leg swelling. History of diabetes, hypertension.  EXAM: CT HEAD WITHOUT CONTRAST  CT MAXILLOFACIAL WITHOUT CONTRAST  TECHNIQUE: Multidetector CT imaging of the head and maxillofacial structures were performed using the standard protocol without intravenous contrast. Multiplanar CT image reconstructions of the maxillofacial structures were also generated.  COMPARISON:  CT head November 18, 2014  FINDINGS: CT HEAD FINDINGS  Stable moderate ventriculomegaly, with commensurate enlargement of cerebral sulci and cerebellar folia. No intraparenchymal hemorrhage, mass effect nor midline shift. Mild supratentorial white matter hypodensities are within normal range for patient's age  and though non-specific suggest sequelae of chronic small vessel ischemic disease. No acute large vascular territory infarcts.  No abnormal extra-axial fluid collections. Basal cisterns are patent. Moderate to severe calcific atherosclerosis of the carotid siphons, to lesser extent included vertebral arteries. No skull fracture.  CT MAXILLOFACIAL FINDINGS  The mandible is intact. The condyles are located. No acute facial fracture. Torus palatini. Multiple absent teeth. Nasal septum is midline. Paranasal sinuses and mastoid air cells are well aerated. No destructive bony lesions.  Ocular globes intact, lenses are located. Normal appearance optic nerve sheath complexes. Normal appearance the extraocular muscles. Preservation orbital fat.  Moderate calcific atherosclerosis of the LEFT greater the RIGHT carotid bulbs. Mild subcutaneous fat inflammation without focal hematoma, focal fluid collection or subcutaneous gas.  IMPRESSION: CT HEAD: No acute intracranial process.  Stable appearance of the head including moderate parenchymal brain volume loss, advanced for age.  CT MAXILLOFACIAL: No acute facial fracture.  Mild subcutaneous fat stranding can be seen with cellulitis or inflammation.   Electronically Signed   By: Elon Alas M.D.   On: 01/21/2015 00:32   Dg Chest Port 1 View  01/21/2015   CLINICAL DATA:  Shortness of breath  EXAM: PORTABLE CHEST - 1 VIEW  COMPARISON:  11/17/2014  FINDINGS: Cardiac enlargement with mild pulmonary vascular congestion. Hazy perihilar infiltration suggests perihilar edema. No blunting of costophrenic angles. No pneumothorax. Mediastinal contours appear intact.  IMPRESSION: Cardiac enlargement with mild pulmonary vascular congestion and perihilar edema.   Electronically Signed   By: Lucienne Capers M.D.   On: 01/21/2015 00:35   Ct Maxillofacial Wo Cm  01/21/2015   CLINICAL DATA:  Disoriented and altered mental status, intermittent confusion for 1 week with facial swelling  and leg swelling. History of diabetes, hypertension.  EXAM: CT HEAD WITHOUT CONTRAST  CT MAXILLOFACIAL WITHOUT CONTRAST  TECHNIQUE: Multidetector CT imaging of the head and maxillofacial structures were performed using the standard protocol without intravenous contrast. Multiplanar CT image reconstructions of the maxillofacial structures were also generated.  COMPARISON:  CT head November 18, 2014  FINDINGS: CT HEAD FINDINGS  Stable moderate ventriculomegaly, with commensurate enlargement of cerebral sulci and cerebellar folia. No intraparenchymal hemorrhage, mass effect nor midline shift. Mild supratentorial white matter hypodensities are within normal range for patient's age and though non-specific suggest sequelae of chronic small vessel ischemic disease. No acute large vascular territory infarcts.  No abnormal extra-axial fluid collections. Basal cisterns are patent. Moderate to severe calcific atherosclerosis of the carotid siphons, to lesser extent included vertebral arteries. No skull fracture.  CT MAXILLOFACIAL FINDINGS  The mandible is intact. The condyles are located. No acute facial fracture. Torus palatini. Multiple absent  teeth. Nasal septum is midline. Paranasal sinuses and mastoid air cells are well aerated. No destructive bony lesions.  Ocular globes intact, lenses are located. Normal appearance optic nerve sheath complexes. Normal appearance the extraocular muscles. Preservation orbital fat.  Moderate calcific atherosclerosis of the LEFT greater the RIGHT carotid bulbs. Mild subcutaneous fat inflammation without focal hematoma, focal fluid collection or subcutaneous gas.  IMPRESSION: CT HEAD: No acute intracranial process.  Stable appearance of the head including moderate parenchymal brain volume loss, advanced for age.  CT MAXILLOFACIAL: No acute facial fracture.  Mild subcutaneous fat stranding can be seen with cellulitis or inflammation.   Electronically Signed   By: Elon Alas M.D.   On:  01/21/2015 00:32    Micro Results      Recent Results (from the past 240 hour(s))  MRSA PCR Screening     Status: None   Collection Time: 01/21/15  6:52 AM  Result Value Ref Range Status   MRSA by PCR NEGATIVE NEGATIVE Final    Comment:        The GeneXpert MRSA Assay (FDA approved for NASAL specimens only), is one component of a comprehensive MRSA colonization surveillance program. It is not intended to diagnose MRSA infection nor to guide or monitor treatment for MRSA infections.        Today   Subjective    Sherilee Battistini today has no headache,no chest abdominal pain,no new weakness tingling or numbness, feels much better wants to go home today.     Objective   Blood pressure 152/68, pulse 76, temperature 98.9 F (37.2 C), temperature source Oral, resp. rate 20, height 5\' 4"  (1.626 m), weight 82.419 kg (181 lb 11.2 oz), SpO2 100 %.   Intake/Output Summary (Last 24 hours) at 01/23/15 0949 Last data filed at 01/23/15 0903  Gross per 24 hour  Intake    935 ml  Output   2600 ml  Net  -1665 ml    Exam Awake Alert, Oriented x 3, No new F.N deficits, Normal affect [place].AT,PERRAL Supple Neck,No JVD, No cervical lymphadenopathy appriciated.  Symmetrical Chest wall movement, Good air movement bilaterally, CTAB RRR,No Gallops,Rubs or new Murmurs, No Parasternal Heave +ve B.Sounds, Abd Soft, Non tender, No organomegaly appriciated, No rebound -guarding or rigidity. No Cyanosis, Clubbing or edema, No new Rash or bruise   Data Review   CBC w Diff: Lab Results  Component Value Date   WBC 6.1 01/23/2015   HGB 8.9* 01/23/2015   HCT 28.1* 01/23/2015   PLT 254 01/23/2015   LYMPHOPCT 17 01/20/2015   MONOPCT 9 01/20/2015   EOSPCT 2 01/20/2015   BASOPCT 0 01/20/2015    CMP: Lab Results  Component Value Date   NA 139 01/22/2015   K 3.9 01/22/2015   CL 112* 01/22/2015   CO2 20* 01/22/2015   BUN 42* 01/22/2015   CREATININE 3.52* 01/22/2015   PROT 6.4* 01/20/2015    ALBUMIN 3.2* 01/20/2015   BILITOT 0.7 01/20/2015   ALKPHOS 106 01/20/2015   AST 47* 01/20/2015   ALT 54 01/20/2015  .   Total Time in preparing paper work, data evaluation and todays exam - 35 minutes  Thurnell Lose M.D on 01/23/2015 at 9:49 AM  [facility]   Office  [phone]

## 2015-01-25 ENCOUNTER — Other Ambulatory Visit (HOSPITAL_COMMUNITY): Payer: Self-pay | Admitting: Orthopedic Surgery

## 2015-01-31 ENCOUNTER — Ambulatory Visit: Payer: Medicare Other | Admitting: Hematology

## 2015-01-31 ENCOUNTER — Other Ambulatory Visit: Payer: Medicare Other

## 2015-02-05 NOTE — Pre-Procedure Instructions (Signed)
Robin [name]  02/05/2015      Your procedure is scheduled on September 14.  Report to [name] Admitting at 8:15 A.M.  Call this number if you have problems the morning of surgery:  [phone]   Remember:  Do not eat food or drink liquids after midnight.  Take these medicines the morning of surgery with A SIP OF WATER Albuterol (if needed), Amlodipine, Benztropine, Carvedilol, Prolixin, Gabapentin, Hydralazine, Hydrocodone (if needed), Isosorbide, Pantoprazole, Tramadol (if needed)   STOP Aspirin today   STOP/ Do not take Aspirin, Aleve, Naproxen, Advil, Ibuprofen, Motrin, Vitamins, Herbs, or Supplements starting today  How to Manage Your Diabetes Before Surgery   Why is it important to control my blood sugar before and after surgery?   Improving blood sugar levels before and after surgery helps healing and can limit problems.  A way of improving blood sugar control is eating a healthy diet by:  - Eating less sugar and carbohydrates  - Increasing activity/exercise  - Talk with your doctor about reaching your blood sugar goals  High blood sugars (greater than 180 mg/dL) can raise your risk of infections and slow down your recovery so you will need to focus on controlling your diabetes during the weeks before surgery.  Make sure that the doctor who takes care of your diabetes knows about your planned surgery including the date and location.  How do I manage my blood sugars before surgery?   Check your blood sugar at least 4 times a day, 2 days before surgery to make sure that they are not too high or low.   Check your blood sugar the morning of your surgery when you wake up and every 2 hours until you get to the Short-Stay unit.  If your blood sugar is less than 70 mg/dL, you will need to treat for low blood sugar by:  Treat a low blood sugar (less than 70 mg/dL) with 1/2 cup of clear juice (cranberry or apple), 4 glucose tablets, OR glucose  gel.  Recheck blood sugar in 15 minutes after treatment (to make sure it is greater than 70 mg/dL).  If blood sugar is not greater than 70 mg/dL on re-check, call [phone] for further instructions.   Report your blood sugar to the Short-Stay nurse when you get to Short-Stay.  References:  [facility], 2007 "How to Manage your Diabetes Before and After Surgery".  What do I do about my diabetes medications?   Do not take oral diabetes medicines (pills) the morning of surgery.  THE NIGHT BEFORE SURGERY, take 16 units of Lantus Insulin.    THE MORNING OF SURGERY, take 20 units of Lantus Insulin.    Do not take other diabetes injectables the day of surgery including Byetta, Victoza, Bydureon, and Trulicity.    If your CBG is greater than 220 mg/dL, you may take 1/2 of your sliding scale (correction) dose of insulin.   Do not wear jewelry, make-up or nail polish.  Do not wear lotions, powders, or perfumes.  You may wear deodorant.  Do not shave 48 hours prior to surgery.  Men may shave face and neck.  Do not bring valuables to the hospital.  [facility] is not responsible for any belongings or valuables.  Contacts, dentures or bridgework may not be worn into surgery.  Leave your suitcase in the car.  After surgery it may be brought to your room.  For patients admitted to the hospital,  discharge time will be determined by your treatment team.  Patients discharged the day of surgery will not be allowed to drive home.   [facility] - Preparing for Surgery  Before surgery, you can play an important role.  Because skin is not sterile, your skin needs to be as free of germs as possible.  You can reduce the number of germs on you skin by washing with CHG (chlorahexidine gluconate) soap before surgery.  CHG is an antiseptic cleaner which kills germs and bonds with the skin to continue killing germs even after washing.  Please DO NOT use if you have an  allergy to CHG or antibacterial soaps.  If your skin becomes reddened/irritated stop using the CHG and inform your nurse when you arrive at Short Stay.  Do not shave (including legs and underarms) for at least 48 hours prior to the first CHG shower.  You may shave your face.  Please follow these instructions carefully:   1.  Shower with CHG Soap the night before surgery and the morning of Surgery.  2.  If you choose to wash your hair, wash your hair first as usual with your normal shampoo.  3.  After you shampoo, rinse your hair and body thoroughly to remove the shampoo.  4.  Use CHG as you would any other liquid soap.  You can apply CHG directly to the skin and wash gently with scrungie or a clean washcloth.  5.  Apply the CHG Soap to your body ONLY FROM THE NECK DOWN.  Do not use on open wounds or open sores.  Avoid contact with your eyes, ears, mouth and genitals (private parts).  Wash genitals (private parts) with your normal soap.  6.  Wash thoroughly, paying special attention to the area where your surgery will be performed.  7.  Thoroughly rinse your body with warm water from the neck down.  8.  DO NOT shower/wash with your normal soap after using and rinsing off the CHG Soap.  9.  Pat yourself dry with a clean towel.            10.  Wear clean pajamas.            11.  Place clean sheets on your bed the night of your first shower and do not sleep with pets.  Day of Surgery  Do not apply any lotions the morning of surgery.  Please wear clean clothes to the hospital/surgery center.    Please read over the following fact sheets that you were given. Pain Booklet, Coughing and Deep Breathing and Surgical Site Infection Prevention

## 2015-02-06 ENCOUNTER — Inpatient Hospital Stay (HOSPITAL_COMMUNITY)
Admission: RE | Admit: 2015-02-06 | Discharge: 2015-02-06 | Disposition: A | Discharge status: Home / Self Care | Payer: Medicare Other | Source: Ambulatory Visit

## 2015-02-06 ENCOUNTER — Encounter (HOSPITAL_COMMUNITY): Payer: Self-pay

## 2015-02-06 ENCOUNTER — Encounter (HOSPITAL_COMMUNITY): Payer: Self-pay | Admitting: Emergency Medicine

## 2015-02-06 HISTORY — DX: Cardiac murmur, unspecified: R01.1

## 2015-02-06 HISTORY — DX: Personal history of other diseases of the respiratory system: Z87.09

## 2015-02-06 HISTORY — DX: Anxiety disorder, unspecified: F41.9

## 2015-02-06 HISTORY — DX: Reserved for inherently not codable concepts without codable children: IMO0001

## 2015-02-06 HISTORY — DX: Chronic kidney disease, stage 5: N18.5

## 2015-02-06 HISTORY — DX: Unspecified osteoarthritis, unspecified site: M19.90

## 2015-02-06 HISTORY — DX: Depression, unspecified: F32.A

## 2015-02-06 HISTORY — DX: Personal history of pneumonia (recurrent): Z87.01

## 2015-02-06 HISTORY — DX: Sleep apnea, unspecified: G47.30

## 2015-02-06 HISTORY — DX: Gastro-esophageal reflux disease without esophagitis: K21.9

## 2015-02-06 HISTORY — DX: Major depressive disorder, single episode, unspecified: F32.9

## 2015-02-06 NOTE — Progress Notes (Signed)
Anesthesia Chart Review:  Pt is 64 year old female scheduled for L ankle removal of hardware, L tibiocalcaneal fusion on 02/07/2015 with Dr. Sharol Given.   Pt did not show up for PAT appointment today so is therefore now a same day work up.   PCP is [name] [place]. Pt reports she does not see cardiology.   PMH includes: CAD (stent at unknown point in time), HTN, CHF, OSA, heart murmur, DM, anemia, CKD (stage 5; baseline creatinine 3.5), GERD, bipolar disorder. Current smoker. BMI 31. S/p ORIF L ankle fracture 11/20/14. S/p closed ankle reduction 11/17/14.   Pt hospitalized 8/27-8/30/2016 for confusion, ventricular tachycardia, anemia (requiring 2 units PRBCs), acute on chronic CHF (BNP 1500).   Medications include: albuterol, amlodipine, ASA, cogentin, bumetanide, carvedilol, darbepoetin, iron, fluphenazine, hyralazine, novolog, levemir, imdur, protonix, seroquel.   Labs will be obtained DOS.   EKG 01/20/2015: Sinus rhythm. Ventricular premature complex. Probable anterior infarct, age indeterminate. Prolonged QT interval No significant change since last tracing 11/17/14.   Echo 11/18/2014: - Left ventricle: The cavity size was normal. Wall thickness was increased in a pattern of moderate LVH. Systolic function was normal. The estimated ejection fraction was in the range of 55% to 60%. Wall motion was normal; there were no regional wall motion abnormalities. Features are consistent with a pseudonormal left ventricular filling pattern, with concomitant abnormal relaxation and increased filling pressure (grade 2 diastolic dysfunction). - Aortic valve: Mildly calcified annulus. Trileaflet; mildly thickened leaflets. Valve area (VTI): 2.22 cm^2. Valve area (Vmax): 2.18 cm^2. - Mitral valve: Mildly calcified annulus. Mildly thickened leaflets - Left atrium: The atrium was mildly dilated.  Cardiac cath 11/29/2009: -LM, CX both normal -Ramus intermediate: ostium of the vessel appeared to have a stent that was widely  patent -LAD: proximal 30%, distal tubular area 40%, ostium of first diagonal with 50% narrowing.  -RCA: proximal tubular stenosis of 40-50%. Distal RCA and PDA free of disease.   No longer sees cardiology but there are notes in correspondence in media tab dated 12/06/09 that indicate she had previously been followed by Dr. Gwenlyn Found.   Reviewed case with Dr. Ermalene Postin. Pt will need to be evaluated by cardiology prior to surgery. Notified Cheryl in Dr. Jess Barters office.   Willeen Cass, FNP-BC [name] Short Stay Surgical Center/Anesthesiology Phone: [phone] 02/06/2015 3:51 PM

## 2015-02-06 NOTE — Progress Notes (Signed)
Patient instructed to arrive for surgery on Wednesday, September 14th, at 0715.   Patient did not show up for pre-op appointment.  Pre-op phone call completed.  History was provided by patient and daughter, Donn Mastin.  Patient denies shortness of breath and chest pain during pre-op phone call.  Patient instructed to take Albuterol, Amlodipine, Benztropine, Carvedilol, Prolixin, Gabapentin, Hydralazine, Isosorbide, Pantoprazole, Tramadol morning of surgery with sip of water.  Patient instructed to only take 16 units of Levemir night prior to surgery and 20 units of levemir morning of surgery.  Patient instructed to hold Novolog morning of surgery unless sugar is greater than 220 the morning of surgery.  Patient and daughter verbalized understanding of medications.    Per patient, she was recently admitted to [facility] for CHF.  Patient states she does not have a cardiologist and her PCP is the [place] in [place].   Patient states that she does have a fistula in the left arm but has not started dialysis at this time.

## 2015-02-07 ENCOUNTER — Inpatient Hospital Stay (HOSPITAL_COMMUNITY): Admission: RE | Admit: 2015-02-07 | Payer: Medicare Other | Source: Ambulatory Visit | Admitting: Orthopedic Surgery

## 2015-02-07 ENCOUNTER — Encounter (HOSPITAL_COMMUNITY): Admission: RE | Payer: Self-pay | Source: Ambulatory Visit

## 2015-02-07 SURGERY — REMOVAL, HARDWARE
Anesthesia: General | Laterality: Left

## 2015-02-07 NOTE — Progress Notes (Signed)
Late entry:  Patient states that she does have sleep apnea but does not wear a CPAP.

## 2015-02-28 ENCOUNTER — Other Ambulatory Visit (HOSPITAL_COMMUNITY): Payer: Self-pay | Admitting: Orthopedic Surgery

## 2015-03-12 NOTE — Pre-Procedure Instructions (Signed)
Robin [name]  03/12/2015      Your procedure is scheduled on October 21  Report to [place] at [phone] A.M.  Call this number if you have problems the morning of surgery:  [phone]   Remember:  Do not eat food or drink liquids after midnight.  Take these medicines the morning of surgery with A SIP OF WATER: albuterol (Proventil) inhaler if needed, amlodipine (Norvasc), benztropine (cogentin),  Carvedilol (coreg), fluphenazine (prolixin), gabapentin (Neurontin), Hydrocodone-acetaminophen(Norco) if needed, isosorbide mononitrate (Imdur), pantoprazole (protonix) tramadol (Ultram)if needed  How to Manage Your Diabetes Before Surgery   Why is it important to control my blood sugar before and after surgery?   Improving blood sugar levels before and after surgery helps healing and can limit problems.  A way of improving blood sugar control is eating a healthy diet by:  - Eating less sugar and carbohydrates  - Increasing activity/exercise  - Talk with your doctor about reaching your blood sugar goals  High blood sugars (greater than 180 mg/dL) can raise your risk of infections and slow down your recovery so you will need to focus on controlling your diabetes during the weeks before surgery.  Make sure that the doctor who takes care of your diabetes knows about your planned surgery including the date and location.  How do I manage my blood sugars before surgery?   Check your blood sugar at least 4 times a day, 2 days before surgery to make sure that they are not too high or low.   Check your blood sugar the morning of your surgery when you wake up and every 2 hours until you get to the Short-Stay unit.  If your blood sugar is less than 70 mg/dL, you will need to treat for low blood sugar by:  Treat a low blood sugar (less than 70 mg/dL) with 1/2 cup of clear juice (cranberry or apple), 4 glucose tablets, OR glucose gel.  Recheck blood sugar in 15 minutes  after treatment (to make sure it is greater than 70 mg/dL).  If blood sugar is not greater than 70 mg/dL on re-check, call [phone] for further instructions.   Report your blood sugar to the Short-Stay nurse when you get to Short-Stay.  References:  [employer] [name], 2007 "How to Manage your Diabetes Before and After Surgery".  What do I do about my diabetes medications?   Do not take oral diabetes medicines (pills) the morning of surgery.   THE NIGHT BEFORE SURGERY, take 16 units of Levemir Insulin.   THE MORNING OF SURGERY, take 20 units of Levemir Insulin.   Do not take other diabetes injectables the day of surgery including Byetta, Victoza, Bydureon, and Trulicity.  If your CBG is greater than 220 mg/dL, you may take 1/2 of your sliding scale (correction) dose of insulin.    Do not wear jewelry, make-up or nail polish.  Do not wear lotions, powders, or perfumes.  You may wear deodorant.  Do not shave 48 hours prior to surgery.  Men may shave face and neck.  Do not bring valuables to the hospital.  [facility] is not responsible for any belongings or valuables.  Contacts, dentures or bridgework may not be worn into surgery.  Leave your suitcase in the car.  After surgery it may be brought to your room.  For patients admitted to the hospital, discharge time will be determined by your treatment team.  Patients discharged the day of  surgery will not be allowed to drive home.   Special instructions:  [place] - Preparing for Surgery  Before surgery, you can play an important role.  Because skin is not sterile, your skin needs to be as free of germs as possible.  You can reduce the number of germs on you skin by washing with CHG (chlorahexidine gluconate) soap before surgery.  CHG is an antiseptic cleaner which kills germs and bonds with the skin to continue killing germs even after washing.  Please DO NOT use if you have an allergy to CHG or  antibacterial soaps.  If your skin becomes reddened/irritated stop using the CHG and inform your nurse when you arrive at Short Stay.  Do not shave (including legs and underarms) for at least 48 hours prior to the first CHG shower.  You may shave your face.  Please follow these instructions carefully:   1.  Shower with CHG Soap the night before surgery and the    morning of Surgery.  2.  If you choose to wash your hair, wash your hair first as usual with your normal shampoo.  3.  After you shampoo, rinse your hair and body thoroughly to remove the  Shampoo.  4.  Use CHG as you would any other liquid soap.  You can apply chg directly  to the skin and wash gently with scrungie or a clean washcloth.  5.  Apply the CHG Soap to your body ONLY FROM THE NECK DOWN.  Do not use on open wounds or open sores.  Avoid contact with your eyes, ears, mouth and genitals (private parts).  Wash genitals (private parts)  with your normal soap.  6.  Wash thoroughly, paying special attention to the area where your surgery   will be performed.  7.  Thoroughly rinse your body with warm water from the neck down.  8.  DO NOT shower/wash with your normal soap after using and rinsing off  the CHG Soap.  9.  Pat yourself dry with a clean towel.            10.  Wear clean pajamas.            11.  Place clean sheets on your bed the night of your first shower and do not sleep with pets.  Day of Surgery  Do not apply any lotions/deoderants the morning of surgery.  Please wear clean clothes to the hospital/surgery center.  Please read over the following fact sheets that you were given. Pain Booklet, Coughing and Deep Breathing, MRSA Information and Surgical Site Infection Prevention

## 2015-03-13 ENCOUNTER — Encounter (HOSPITAL_COMMUNITY)
Admission: RE | Admit: 2015-03-13 | Discharge: 2015-03-13 | Disposition: A | Discharge status: Home / Self Care | Payer: Medicare Other | Source: Ambulatory Visit | Attending: Orthopedic Surgery | Admitting: Orthopedic Surgery

## 2015-03-13 ENCOUNTER — Encounter (HOSPITAL_COMMUNITY): Payer: Self-pay

## 2015-03-13 DIAGNOSIS — Z01812 Encounter for preprocedural laboratory examination: Secondary | ICD-10-CM

## 2015-03-13 DIAGNOSIS — K219 Gastro-esophageal reflux disease without esophagitis: Secondary | ICD-10-CM

## 2015-03-13 DIAGNOSIS — N185 Chronic kidney disease, stage 5: Secondary | ICD-10-CM | POA: Insufficient documentation

## 2015-03-13 DIAGNOSIS — Z79899 Other long term (current) drug therapy: Secondary | ICD-10-CM | POA: Insufficient documentation

## 2015-03-13 DIAGNOSIS — F319 Bipolar disorder, unspecified: Secondary | ICD-10-CM

## 2015-03-13 DIAGNOSIS — I10 Essential (primary) hypertension: Secondary | ICD-10-CM | POA: Insufficient documentation

## 2015-03-13 DIAGNOSIS — G4733 Obstructive sleep apnea (adult) (pediatric): Secondary | ICD-10-CM

## 2015-03-13 DIAGNOSIS — Z955 Presence of coronary angioplasty implant and graft: Secondary | ICD-10-CM

## 2015-03-13 DIAGNOSIS — Z7982 Long term (current) use of aspirin: Secondary | ICD-10-CM | POA: Insufficient documentation

## 2015-03-13 DIAGNOSIS — Z794 Long term (current) use of insulin: Secondary | ICD-10-CM | POA: Insufficient documentation

## 2015-03-13 DIAGNOSIS — Z01818 Encounter for other preprocedural examination: Secondary | ICD-10-CM

## 2015-03-13 DIAGNOSIS — I120 Hypertensive chronic kidney disease with stage 5 chronic kidney disease or end stage renal disease: Secondary | ICD-10-CM | POA: Insufficient documentation

## 2015-03-13 DIAGNOSIS — I2510 Atherosclerotic heart disease of native coronary artery without angina pectoris: Secondary | ICD-10-CM | POA: Insufficient documentation

## 2015-03-13 DIAGNOSIS — E1122 Type 2 diabetes mellitus with diabetic chronic kidney disease: Secondary | ICD-10-CM

## 2015-03-13 HISTORY — DX: Type 2 diabetes mellitus without complications: E11.9

## 2015-03-13 LAB — COMPREHENSIVE METABOLIC PANEL
ALK PHOS: 94 U/L (ref 38–126)
ALT: 12 U/L — AB (ref 14–54)
AST: 14 U/L — ABNORMAL LOW (ref 15–41)
Albumin: 3.2 g/dL — ABNORMAL LOW (ref 3.5–5.0)
Anion gap: 11 (ref 5–15)
BUN: 49 mg/dL — ABNORMAL HIGH (ref 6–20)
CALCIUM: 9.6 mg/dL (ref 8.9–10.3)
CHLORIDE: 103 mmol/L (ref 101–111)
CO2: 29 mmol/L (ref 22–32)
CREATININE: 4.54 mg/dL — AB (ref 0.44–1.00)
GFR, EST AFRICAN AMERICAN: 11 mL/min — AB (ref >60)
GFR, EST NON AFRICAN AMERICAN: 9 mL/min — AB (ref >60)
Glucose, Bld: 99 mg/dL (ref 65–99)
Potassium: 3.2 mmol/L — ABNORMAL LOW (ref 3.5–5.1)
Sodium: 143 mmol/L (ref 135–145)
TOTAL PROTEIN: 6.7 g/dL (ref 6.5–8.1)
Total Bilirubin: 0.4 mg/dL (ref 0.3–1.2)

## 2015-03-13 LAB — PROTIME-INR
INR: 1.19 (ref 0.00–1.49)
PROTHROMBIN TIME: 15.3 s — AB (ref 11.6–15.2)

## 2015-03-13 LAB — CBC
HCT: 31.5 % — ABNORMAL LOW (ref 36.0–46.0)
Hemoglobin: 10.1 g/dL — ABNORMAL LOW (ref 12.0–15.0)
MCH: 29.5 pg (ref 26.0–34.0)
MCHC: 32.1 g/dL (ref 30.0–36.0)
MCV: 92.1 fL (ref 78.0–100.0)
PLATELETS: 241 10*3/uL (ref 150–400)
RBC: 3.42 MIL/uL — AB (ref 3.87–5.11)
RDW: 16.7 % — ABNORMAL HIGH (ref 11.5–15.5)
WBC: 7.6 10*3/uL (ref 4.0–10.5)

## 2015-03-13 LAB — SURGICAL PCR SCREEN
MRSA, PCR: NEGATIVE
Staphylococcus aureus: NEGATIVE

## 2015-03-13 LAB — APTT: aPTT: 34 seconds (ref 24–37)

## 2015-03-13 LAB — GLUCOSE, CAPILLARY: Glucose-Capillary: 94 mg/dL (ref 65–99)

## 2015-03-13 NOTE — Progress Notes (Signed)
Patient denies chest pain, shob. Report PCP is [facility]. Patient reports that she was seen by cardiology and cleared for surgery. [facility]. Requested cardiology, primary care, HgA1C notes from [facility]. Patient reports that surgeon also has cardiac clearance. Will request from [place].

## 2015-03-14 ENCOUNTER — Encounter (HOSPITAL_COMMUNITY): Payer: Self-pay

## 2015-03-14 NOTE — Progress Notes (Signed)
Anesthesia Follow-up: See anesthesia note from [place], FNP-BC from 02/06/15. Removal of left ankle hardware and left tibiocalcaneal fusion was postponed to allow time for cardiac clearance.   She has since been evaluated by Katherina Mires, PA-C with [facility].  According to 02/14/15 notes, patient has known abnormal stress test in 04/2014, medical management recommended because patient's symptoms were stable and because she was not yet on hemodialysis with Stage V CKD. "...from a Cards perspective, since, so further cardiac testing (i.e. Cardiac cath) has greater potential for harm than benefit due to her severe Stable V CKD (and the likelihood that she would develops permanent dye nephropathy requiring hemodialysis...pt is considered as having an 11% risk of perioperative cardiac event, but further testing nor changes in her current medical management would lower that risk." Plan was discussed with his attending Dr. Rosanne Sack. A copy of the full office note is on her paper chart for review as needed.   History includes smoking, CAD with PTCA Ramus '04 and BMS Ramus '07, HTN, diastolic CHF, OSA, heart murmur, DM, anemia, CKD stage V; left AVF done 09/2014 at [facility]), GERD, bipolar disorder. S/p ORIF L ankle fracture 11/20/14. S/p closed ankle reduction 11/17/14. Hospitalized 12/2014 for confusion, anemia, and acute on chronic diastolic CHF in the setting of stage V CKD.  BMI 29. Marland Kitchen PCP is with the [facility], currently [place].  Meds include albuterol, amlodipine, ASA, Lipitor, Cogentin, Bumex, Aranesp, Prolixin, Neurontin, hydralazine, Novolog, Levemir, Imdur, Protonix, Seroquel, tramadol.    01/20/15 EKG: Sinus rhythm. Ventricular premature complex. Probable anterior infarct, age indeterminate. Prolonged QT interval. No significant change since last tracing 11/17/14. (Also had 02/14/15 EKG at [facility] showing: NSR HR 78 LAD, volts for LVH, ST-T abnormalities, no sig change from previous EKG of 09/26/2014." Will  request tracing.)  04/2014 Stress test (as outlined in 02/14/15 [facility] notes): "Abnormal myocardial perfusion scintigraphy compatible with mild stress-induced myocardial ischemia in the posterior descending and possibly obtuse marginal coronary artery distribution. Left ventricular hypertrophy with mild systolic dysfunction. Very poor exercise capacity without reproduction of symptoms." (Report requested)   Echo 11/18/2014: - Left ventricle: The cavity size was normal. Wall thickness was increased in a pattern of moderate LVH. Systolic function was normal. The estimated ejection fraction was in the range of 55% to 60%. Wall motion was normal; there were no regional wall motion abnormalities. Features are consistent with a pseudonormal left ventricular filling pattern, with concomitant abnormal relaxation and increased filling pressure (grade 2 diastolic dysfunction). - Aortic valve: Mildly calcified annulus. Trileaflet; mildly thickened leaflets. Valve area (VTI): 2.22 cm^2. Valve area (Vmax): 2.18 cm^2. - Mitral valve: Mildly calcified annulus. Mildly thickened leaflets - Left atrium: The atrium was mildly dilated.  Cardiac cath 11/29/2009: -LM, CX both normal -Ramus intermediate: ostium of the vessel appeared to have a stent that was widely patent -LAD: proximal 30%, distal tubular area 40%, ostium of first diagonal with 50% narrowing.  -RCA: proximal tubular stenosis of 40-50%. Distal RCA and PDA free of disease.   01/20/15 1V CXR: IMPRESSION: Cardiac enlargement with mild pulmonary vascular congestion and perihilar edema.  Preoperative labs noted. BUN 49, Cr 4.54 (comparison BUN 44-66 since 10/2014; comparison Cr 3.41-4.27 since 10/2014).   Above reviewed with anesthesiologist Dr. Oletta Lamas. Cardiology recommendations noted. In regards to her renal function, since our last labs at [facility] show her Cr more in the 3.5 range, Dr. Oletta Lamas recommended touching base with patient's nephrologist. I called patient  who did verify that she has a  LUE AVF but has not started hemodialysis yet. She is followed by a nephrologist at the [facility]. She told me "Leitha Bleak" and gave me the main phone number of [phone]. The [facility] operator said it was actually a Dr. Tammi Klippel and transferred me right to her line. Dr. Tammi Klippel was aware of surgery plans. I did review patient's latest labs with her. She said patient's latest Cr range there had been in the 3.6-4.8 range with GFR ~ 12-13. Patient is on diuretics and has AVF available if needed, so she does not have any additional pre-operative recommendations as long as patient does not present with hyperkalemia/acidotic. Her K was actually 3.2 yesterday, but since she has stage V CKD, will go a head and check an ISTAT4 on arrival to re-evaluate her K+.  If patient without acute changes and ISTAT results acceptable then would anticipate that she can proceed as planned. I did request her last nephrology notes, so hopefully they will be on her chart in case she requires a nephrology consult during her hospitalization. [place] cardiology notes are already on her chart.   [facility] Short Stay Center/Anesthesiology Phone [phone] 03/14/2015 1:40 PM

## 2015-03-16 ENCOUNTER — Inpatient Hospital Stay (HOSPITAL_COMMUNITY): Payer: Medicare Other | Admitting: Vascular Surgery

## 2015-03-16 ENCOUNTER — Encounter (HOSPITAL_COMMUNITY)
Admission: RE | Disposition: A | Discharge status: Skilled Nursing Facility | Payer: Self-pay | Source: Ambulatory Visit | Attending: Orthopedic Surgery

## 2015-03-16 ENCOUNTER — Inpatient Hospital Stay (HOSPITAL_COMMUNITY): Payer: Medicare Other | Admitting: Anesthesiology

## 2015-03-16 ENCOUNTER — Encounter (HOSPITAL_COMMUNITY): Payer: Self-pay | Admitting: *Deleted

## 2015-03-16 ENCOUNTER — Inpatient Hospital Stay (HOSPITAL_COMMUNITY)
Admission: RE | Admit: 2015-03-16 | Discharge: 2015-03-20 | DRG: 982 | Disposition: A | Discharge status: Skilled Nursing Facility | Payer: Medicare Other | Source: Ambulatory Visit | Attending: Orthopedic Surgery | Admitting: Orthopedic Surgery

## 2015-03-16 DIAGNOSIS — E11610 Type 2 diabetes mellitus with diabetic neuropathic arthropathy: Principal | ICD-10-CM | POA: Diagnosis present

## 2015-03-16 DIAGNOSIS — I5032 Chronic diastolic (congestive) heart failure: Secondary | ICD-10-CM | POA: Diagnosis present

## 2015-03-16 DIAGNOSIS — F319 Bipolar disorder, unspecified: Secondary | ICD-10-CM | POA: Diagnosis present

## 2015-03-16 DIAGNOSIS — K219 Gastro-esophageal reflux disease without esophagitis: Secondary | ICD-10-CM | POA: Diagnosis present

## 2015-03-16 DIAGNOSIS — E1122 Type 2 diabetes mellitus with diabetic chronic kidney disease: Secondary | ICD-10-CM | POA: Diagnosis present

## 2015-03-16 DIAGNOSIS — F419 Anxiety disorder, unspecified: Secondary | ICD-10-CM | POA: Diagnosis present

## 2015-03-16 DIAGNOSIS — M14679 Charcot's joint, unspecified ankle and foot: Secondary | ICD-10-CM | POA: Diagnosis present

## 2015-03-16 DIAGNOSIS — Z955 Presence of coronary angioplasty implant and graft: Secondary | ICD-10-CM | POA: Diagnosis not present

## 2015-03-16 DIAGNOSIS — F17210 Nicotine dependence, cigarettes, uncomplicated: Secondary | ICD-10-CM | POA: Diagnosis present

## 2015-03-16 DIAGNOSIS — I2510 Atherosclerotic heart disease of native coronary artery without angina pectoris: Secondary | ICD-10-CM | POA: Diagnosis present

## 2015-03-16 DIAGNOSIS — I132 Hypertensive heart and chronic kidney disease with heart failure and with stage 5 chronic kidney disease, or end stage renal disease: Secondary | ICD-10-CM | POA: Diagnosis present

## 2015-03-16 DIAGNOSIS — N185 Chronic kidney disease, stage 5: Secondary | ICD-10-CM | POA: Diagnosis present

## 2015-03-16 DIAGNOSIS — Z885 Allergy status to narcotic agent status: Secondary | ICD-10-CM | POA: Diagnosis not present

## 2015-03-16 HISTORY — PX: HARDWARE REMOVAL: SHX979

## 2015-03-16 HISTORY — PX: ANKLE FUSION: SHX5718

## 2015-03-16 LAB — GLUCOSE, CAPILLARY
GLUCOSE-CAPILLARY: 379 mg/dL — AB (ref 65–99)
GLUCOSE-CAPILLARY: 71 mg/dL (ref 65–99)
Glucose-Capillary: 163 mg/dL — ABNORMAL HIGH (ref 65–99)
Glucose-Capillary: 231 mg/dL — ABNORMAL HIGH (ref 65–99)
Glucose-Capillary: 153 mg/dL — ABNORMAL HIGH (ref 65–99)
Glucose-Capillary: 62 mg/dL — ABNORMAL LOW (ref 65–99)
Glucose-Capillary: 205 mg/dL — ABNORMAL HIGH (ref 65–99)
Glucose-Capillary: 233 mg/dL — ABNORMAL HIGH (ref 65–99)

## 2015-03-16 LAB — POCT I-STAT 4, (NA,K, GLUC, HGB,HCT)
GLUCOSE: 380 mg/dL — AB (ref 65–99)
HEMATOCRIT: 34 % — AB (ref 36.0–46.0)
Hemoglobin: 11.6 g/dL — ABNORMAL LOW (ref 12.0–15.0)
Potassium: 4.7 mmol/L (ref 3.5–5.1)
SODIUM: 140 mmol/L (ref 135–145)

## 2015-03-16 SURGERY — REMOVAL, HARDWARE
Anesthesia: Monitor Anesthesia Care | Laterality: Left

## 2015-03-16 MED ORDER — ACETAMINOPHEN 160 MG/5ML PO SOLN
325.0000 mg | ORAL | Status: DC | PRN
  Filled 2015-03-16: qty 20.3

## 2015-03-16 MED ORDER — HYDROMORPHONE HCL 1 MG/ML IJ SOLN
1.0000 mg | INTRAMUSCULAR | Status: DC | PRN
  Administered 2015-03-17 (×2): 1 mg via INTRAVENOUS
  Filled 2015-03-16 (×2): qty 1

## 2015-03-16 MED ORDER — CEFAZOLIN SODIUM-DEXTROSE 2-3 GM-% IV SOLR: 2.0000 g | INTRAVENOUS | Status: DC

## 2015-03-16 MED ORDER — ASPIRIN EC 81 MG PO TBEC
81.0000 mg | DELAYED_RELEASE_TABLET | Freq: Every day | ORAL | Status: DC
  Administered 2015-03-17 – 2015-03-20 (×4): 81 mg via ORAL
  Filled 2015-03-16 (×4): qty 1

## 2015-03-16 MED ORDER — ACETAMINOPHEN 325 MG PO TABS
650.0000 mg | ORAL_TABLET | Freq: Four times a day (QID) | ORAL | Status: DC | PRN
Start: 2015-03-16 — End: 2015-03-20
  Administered 2015-03-17 – 2015-03-20 (×4): 650 mg via ORAL
  Filled 2015-03-16 (×4): qty 2

## 2015-03-16 MED ORDER — PROPOFOL 500 MG/50ML IV EMUL
INTRAVENOUS | Status: DC | PRN
  Administered 2015-03-16: 75 ug/kg/min via INTRAVENOUS

## 2015-03-16 MED ORDER — METHOCARBAMOL 500 MG PO TABS
500.0000 mg | ORAL_TABLET | Freq: Four times a day (QID) | ORAL | Status: DC | PRN
  Administered 2015-03-16 (×2): 500 mg via ORAL
  Filled 2015-03-16: qty 1

## 2015-03-16 MED ORDER — GABAPENTIN 300 MG PO CAPS
300.0000 mg | ORAL_CAPSULE | Freq: Two times a day (BID) | ORAL | Status: DC
  Administered 2015-03-16 – 2015-03-20 (×8): 300 mg via ORAL
  Filled 2015-03-16 (×9): qty 1

## 2015-03-16 MED ORDER — METHOCARBAMOL 500 MG PO TABS
ORAL_TABLET | ORAL | Status: AC
  Administered 2015-03-16: 500 mg via ORAL
  Filled 2015-03-16: qty 1

## 2015-03-16 MED ORDER — MIDAZOLAM HCL 2 MG/2ML IJ SOLN: 1.0000 mg | Freq: Once | INTRAMUSCULAR | Status: DC

## 2015-03-16 MED ORDER — INSULIN DETEMIR 100 UNIT/ML ~~LOC~~ SOLN
20.0000 [IU] | Freq: Every day | SUBCUTANEOUS | Status: DC
  Administered 2015-03-16 – 2015-03-17 (×2): 20 [IU] via SUBCUTANEOUS
  Administered 2015-03-18: 10 [IU] via SUBCUTANEOUS
  Administered 2015-03-19: 20 [IU] via SUBCUTANEOUS
  Filled 2015-03-16 (×5): qty 0.2

## 2015-03-16 MED ORDER — PANTOPRAZOLE SODIUM 20 MG PO TBEC
20.0000 mg | DELAYED_RELEASE_TABLET | Freq: Every day | ORAL | Status: DC
  Administered 2015-03-17 – 2015-03-20 (×4): 20 mg via ORAL
  Filled 2015-03-16 (×5): qty 1

## 2015-03-16 MED ORDER — VANCOMYCIN HCL 500 MG IV SOLR
INTRAVENOUS | Status: AC
  Filled 2015-03-16: qty 500

## 2015-03-16 MED ORDER — FENTANYL CITRATE (PF) 250 MCG/5ML IJ SOLN
INTRAMUSCULAR | Status: AC
  Filled 2015-03-16: qty 5

## 2015-03-16 MED ORDER — INSULIN ASPART 100 UNIT/ML ~~LOC~~ SOLN
4.0000 [IU] | Freq: Three times a day (TID) | SUBCUTANEOUS | Status: DC
  Administered 2015-03-17 – 2015-03-20 (×3): 4 [IU] via SUBCUTANEOUS

## 2015-03-16 MED ORDER — BENZTROPINE MESYLATE 0.5 MG PO TABS
0.5000 mg | ORAL_TABLET | Freq: Two times a day (BID) | ORAL | Status: DC
  Administered 2015-03-16 – 2015-03-20 (×8): 0.5 mg via ORAL
  Filled 2015-03-16 (×9): qty 1

## 2015-03-16 MED ORDER — HYDROCODONE-ACETAMINOPHEN 5-325 MG PO TABS
ORAL_TABLET | ORAL | Status: AC
  Administered 2015-03-16: 2 via ORAL
  Filled 2015-03-16: qty 2

## 2015-03-16 MED ORDER — FLUPHENAZINE HCL 1 MG PO TABS
1.0000 mg | ORAL_TABLET | Freq: Two times a day (BID) | ORAL | Status: DC
  Administered 2015-03-16 – 2015-03-20 (×7): 1 mg via ORAL
  Filled 2015-03-16 (×10): qty 1

## 2015-03-16 MED ORDER — PROPOFOL 10 MG/ML IV BOLUS
INTRAVENOUS | Status: AC
  Filled 2015-03-16: qty 20

## 2015-03-16 MED ORDER — LIDOCAINE HCL (CARDIAC) 20 MG/ML IV SOLN
INTRAVENOUS | Status: AC
  Filled 2015-03-16: qty 5

## 2015-03-16 MED ORDER — ALBUTEROL SULFATE HFA 108 (90 BASE) MCG/ACT IN AERS: 1.0000 | INHALATION_SPRAY | Freq: Four times a day (QID) | RESPIRATORY_TRACT | Status: DC | PRN

## 2015-03-16 MED ORDER — CEFAZOLIN SODIUM-DEXTROSE 2-3 GM-% IV SOLR
2.0000 g | Freq: Once | INTRAVENOUS | Status: AC
  Administered 2015-03-17: 2 g via INTRAVENOUS
  Filled 2015-03-16: qty 50

## 2015-03-16 MED ORDER — CARVEDILOL 25 MG PO TABS
25.0000 mg | ORAL_TABLET | Freq: Two times a day (BID) | ORAL | Status: DC
  Administered 2015-03-16 – 2015-03-20 (×8): 25 mg via ORAL
  Filled 2015-03-16 (×8): qty 1

## 2015-03-16 MED ORDER — 0.9 % SODIUM CHLORIDE (POUR BTL) OPTIME
TOPICAL | Status: DC | PRN
  Administered 2015-03-16: 1000 mL

## 2015-03-16 MED ORDER — SUCCINYLCHOLINE CHLORIDE 20 MG/ML IJ SOLN
INTRAMUSCULAR | Status: AC
  Filled 2015-03-16: qty 1

## 2015-03-16 MED ORDER — CEFAZOLIN SODIUM-DEXTROSE 2-3 GM-% IV SOLR
INTRAVENOUS | Status: AC
  Administered 2015-03-16: 2 g via INTRAVENOUS
  Filled 2015-03-16: qty 50

## 2015-03-16 MED ORDER — ONDANSETRON HCL 4 MG/2ML IJ SOLN
INTRAMUSCULAR | Status: DC | PRN
  Administered 2015-03-16: 4 mg via INTRAVENOUS

## 2015-03-16 MED ORDER — DEXTROSE 50 % IV SOLN
INTRAVENOUS | Status: DC | PRN
  Administered 2015-03-16: 25 g via INTRAVENOUS

## 2015-03-16 MED ORDER — ATORVASTATIN CALCIUM 40 MG PO TABS
40.0000 mg | ORAL_TABLET | Freq: Every evening | ORAL | Status: DC
  Administered 2015-03-16 – 2015-03-19 (×4): 40 mg via ORAL
  Filled 2015-03-16 (×4): qty 1

## 2015-03-16 MED ORDER — CHLORHEXIDINE GLUCONATE 4 % EX LIQD: 60.0000 mL | Freq: Once | CUTANEOUS | Status: DC

## 2015-03-16 MED ORDER — FENTANYL CITRATE (PF) 100 MCG/2ML IJ SOLN
INTRAMUSCULAR | Status: AC
  Administered 2015-03-16: 50 ug via INTRAVENOUS
  Filled 2015-03-16: qty 2

## 2015-03-16 MED ORDER — ONDANSETRON HCL 4 MG/2ML IJ SOLN: 4.0000 mg | Freq: Four times a day (QID) | INTRAMUSCULAR | Status: DC | PRN

## 2015-03-16 MED ORDER — HYDROCODONE-ACETAMINOPHEN 5-325 MG PO TABS
1.0000 | ORAL_TABLET | ORAL | Status: DC | PRN
  Administered 2015-03-16 – 2015-03-17 (×3): 2 via ORAL
  Filled 2015-03-16 (×2): qty 2

## 2015-03-16 MED ORDER — ISOSORBIDE MONONITRATE ER 60 MG PO TB24
60.0000 mg | ORAL_TABLET | Freq: Every day | ORAL | Status: DC
  Administered 2015-03-17 – 2015-03-20 (×4): 60 mg via ORAL
  Filled 2015-03-16 (×4): qty 1

## 2015-03-16 MED ORDER — SODIUM CHLORIDE 0.9 % IV SOLN
INTRAVENOUS | Status: DC
  Administered 2015-03-16 (×3): via INTRAVENOUS

## 2015-03-16 MED ORDER — ONDANSETRON HCL 4 MG PO TABS: 4.0000 mg | ORAL_TABLET | Freq: Four times a day (QID) | ORAL | Status: DC | PRN

## 2015-03-16 MED ORDER — QUETIAPINE FUMARATE 400 MG PO TABS
200.0000 mg | ORAL_TABLET | Freq: Every day | ORAL | Status: DC
  Administered 2015-03-16 – 2015-03-17 (×2): 200 mg via ORAL
  Filled 2015-03-16 (×2): qty 1

## 2015-03-16 MED ORDER — ROCURONIUM BROMIDE 50 MG/5ML IV SOLN
INTRAVENOUS | Status: AC
  Filled 2015-03-16: qty 1

## 2015-03-16 MED ORDER — INSULIN DETEMIR 100 UNIT/ML ~~LOC~~ SOLN
40.0000 [IU] | Freq: Every day | SUBCUTANEOUS | Status: DC
  Administered 2015-03-17 – 2015-03-20 (×2): 40 [IU] via SUBCUTANEOUS
  Filled 2015-03-16 (×4): qty 0.4

## 2015-03-16 MED ORDER — DOCUSATE SODIUM 100 MG PO CAPS
100.0000 mg | ORAL_CAPSULE | Freq: Two times a day (BID) | ORAL | Status: DC
  Administered 2015-03-16 – 2015-03-20 (×8): 100 mg via ORAL
  Filled 2015-03-16 (×9): qty 1

## 2015-03-16 MED ORDER — AMLODIPINE BESYLATE 10 MG PO TABS
10.0000 mg | ORAL_TABLET | Freq: Every day | ORAL | Status: DC
  Administered 2015-03-16 – 2015-03-20 (×5): 10 mg via ORAL
  Filled 2015-03-16 (×5): qty 1

## 2015-03-16 MED ORDER — INSULIN ASPART 100 UNIT/ML ~~LOC~~ SOLN
10.0000 [IU] | Freq: Once | SUBCUTANEOUS | Status: AC
  Administered 2015-03-16: 10 [IU] via SUBCUTANEOUS

## 2015-03-16 MED ORDER — DARBEPOETIN ALFA-POLYSORBATE 40 MCG/0.4ML IJ SOLN: 40.0000 ug | INTRAMUSCULAR | Status: DC

## 2015-03-16 MED ORDER — METHOCARBAMOL 1000 MG/10ML IJ SOLN
500.0000 mg | Freq: Four times a day (QID) | INTRAVENOUS | Status: DC | PRN
  Filled 2015-03-16: qty 5

## 2015-03-16 MED ORDER — INSULIN ASPART 100 UNIT/ML ~~LOC~~ SOLN
0.0000 [IU] | Freq: Three times a day (TID) | SUBCUTANEOUS | Status: DC
  Administered 2015-03-18: 3 [IU] via SUBCUTANEOUS
  Administered 2015-03-18: 2 [IU] via SUBCUTANEOUS
  Administered 2015-03-19 (×2): 3 [IU] via SUBCUTANEOUS
  Administered 2015-03-20: 5 [IU] via SUBCUTANEOUS

## 2015-03-16 MED ORDER — DEXTROSE 50 % IV SOLN
INTRAVENOUS | Status: AC
  Filled 2015-03-16: qty 50

## 2015-03-16 MED ORDER — INSULIN DETEMIR 100 UNIT/ML ~~LOC~~ SOLN
20.0000 [IU] | Freq: Once | SUBCUTANEOUS | Status: AC
  Administered 2015-03-16: 20 [IU] via SUBCUTANEOUS
  Filled 2015-03-16: qty 0.2

## 2015-03-16 MED ORDER — ACETAMINOPHEN 325 MG PO TABS: 325.0000 mg | ORAL_TABLET | ORAL | Status: DC | PRN

## 2015-03-16 MED ORDER — METOCLOPRAMIDE HCL 5 MG/ML IJ SOLN: 5.0000 mg | Freq: Three times a day (TID) | INTRAMUSCULAR | Status: DC | PRN

## 2015-03-16 MED ORDER — GENTAMICIN SULFATE 40 MG/ML IJ SOLN
INTRAMUSCULAR | Status: AC
  Filled 2015-03-16: qty 2

## 2015-03-16 MED ORDER — SODIUM CHLORIDE 0.9 % IV SOLN: INTRAVENOUS | Status: DC

## 2015-03-16 MED ORDER — HYDRALAZINE HCL 50 MG PO TABS
100.0000 mg | ORAL_TABLET | Freq: Two times a day (BID) | ORAL | Status: DC
  Administered 2015-03-16 – 2015-03-20 (×8): 100 mg via ORAL
  Filled 2015-03-16 (×16): qty 2

## 2015-03-16 MED ORDER — FENTANYL CITRATE (PF) 100 MCG/2ML IJ SOLN
INTRAMUSCULAR | Status: AC
  Filled 2015-03-16: qty 2

## 2015-03-16 MED ORDER — INSULIN DETEMIR 100 UNIT/ML ~~LOC~~ SOLN: 20.0000 [IU] | Freq: Every day | SUBCUTANEOUS | Status: DC

## 2015-03-16 MED ORDER — FENTANYL CITRATE (PF) 100 MCG/2ML IJ SOLN
25.0000 ug | INTRAMUSCULAR | Status: DC | PRN
  Administered 2015-03-16: 50 ug via INTRAVENOUS

## 2015-03-16 MED ORDER — FENTANYL CITRATE (PF) 100 MCG/2ML IJ SOLN
100.0000 ug | Freq: Once | INTRAMUSCULAR | Status: AC
  Administered 2015-03-16: 100 ug via INTRAVENOUS

## 2015-03-16 MED ORDER — ACETAMINOPHEN 650 MG RE SUPP: 650.0000 mg | Freq: Four times a day (QID) | RECTAL | Status: DC | PRN

## 2015-03-16 MED ORDER — BUMETANIDE 2 MG PO TABS
4.0000 mg | ORAL_TABLET | Freq: Two times a day (BID) | ORAL | Status: DC
  Administered 2015-03-16 – 2015-03-20 (×8): 4 mg via ORAL
  Filled 2015-03-16 (×10): qty 2

## 2015-03-16 MED ORDER — BUPIVACAINE-EPINEPHRINE (PF) 0.5% -1:200000 IJ SOLN
INTRAMUSCULAR | Status: DC | PRN
  Administered 2015-03-16: 30 mL via PERINEURAL
  Administered 2015-03-16: 10 mL via PERINEURAL

## 2015-03-16 MED ORDER — INSULIN ASPART 100 UNIT/ML ~~LOC~~ SOLN
SUBCUTANEOUS | Status: AC
  Filled 2015-03-16: qty 1

## 2015-03-16 MED ORDER — FENTANYL CITRATE (PF) 100 MCG/2ML IJ SOLN
INTRAMUSCULAR | Status: DC | PRN
  Administered 2015-03-16: 50 ug via INTRAVENOUS

## 2015-03-16 MED ORDER — METOCLOPRAMIDE HCL 5 MG PO TABS
5.0000 mg | ORAL_TABLET | Freq: Three times a day (TID) | ORAL | Status: DC | PRN
  Administered 2015-03-19: 5 mg via ORAL
  Filled 2015-03-16: qty 1

## 2015-03-16 MED ORDER — MIDAZOLAM HCL 2 MG/2ML IJ SOLN
INTRAMUSCULAR | Status: AC
  Administered 2015-03-16: 1 mg
  Filled 2015-03-16: qty 2

## 2015-03-16 SURGICAL SUPPLY — 66 items
BANDAGE ESMARK 6X9 LF (GAUZE/BANDAGES/DRESSINGS) ×1 IMPLANT
BIT DRILL CALIBRATED 4.2 (BIT) ×1 IMPLANT
BIT DRILL CALIBRATED 5.0 MM (BIT) ×1 IMPLANT
BIT DRILL CANNULATED 13.0X300M (BIT) ×1 IMPLANT
BLADE SAW SGTL 81X20 HD (BLADE) ×3 IMPLANT
BLADE SAW SGTL HD 18.5X60.5X1. (BLADE) ×3 IMPLANT
BLADE SURG 10 STRL SS (BLADE) IMPLANT
BNDG COHESIVE 4X5 TAN STRL (GAUZE/BANDAGES/DRESSINGS) ×3 IMPLANT
BNDG ESMARK 6X9 LF (GAUZE/BANDAGES/DRESSINGS) ×3
BNDG GAUZE ELAST 4 BULKY (GAUZE/BANDAGES/DRESSINGS) ×3 IMPLANT
CAP END T25 HF ARTHRO NAIL EX (Cap) ×1 IMPLANT
COVER MAYO STAND STRL (DRAPES) IMPLANT
COVER SURGICAL LIGHT HANDLE (MISCELLANEOUS) ×3 IMPLANT
CUFF TOURNIQUET SINGLE 34IN LL (TOURNIQUET CUFF) IMPLANT
CUFF TOURNIQUET SINGLE 44IN (TOURNIQUET CUFF) IMPLANT
DRAPE C-ARM 42X72 X-RAY (DRAPES) IMPLANT
DRAPE INCISE IOBAN 66X45 STRL (DRAPES) IMPLANT
DRAPE OEC MINIVIEW 54X84 (DRAPES) ×3 IMPLANT
DRAPE ORTHO SPLIT 77X108 STRL (DRAPES)
DRAPE SURG ORHT 6 SPLT 77X108 (DRAPES) IMPLANT
DRAPE U-SHAPE 47X51 STRL (DRAPES) ×3 IMPLANT
DRILL BIT CALIBRATED 4.2 (BIT) ×3
DRILL BIT CALIBRATED 5.0 MM (BIT) ×3
DRILL BIT CANNULATED 13.0X300M (BIT) ×3
DRSG ADAPTIC 3X8 NADH LF (GAUZE/BANDAGES/DRESSINGS) ×3 IMPLANT
DRSG EMULSION OIL 3X3 NADH (GAUZE/BANDAGES/DRESSINGS) ×3 IMPLANT
DRSG PAD ABDOMINAL 8X10 ST (GAUZE/BANDAGES/DRESSINGS) ×3 IMPLANT
DURAPREP 26ML APPLICATOR (WOUND CARE) ×3 IMPLANT
ELECT REM PT RETURN 9FT ADLT (ELECTROSURGICAL) ×3
ELECTRODE REM PT RTRN 9FT ADLT (ELECTROSURGICAL) ×1 IMPLANT
ENDCAP T25 HF ARTHRO NAIL EX (Cap) ×2 IMPLANT
GAUZE SPONGE 4X4 12PLY STRL (GAUZE/BANDAGES/DRESSINGS) ×3 IMPLANT
GLOVE BIOGEL PI IND STRL 9 (GLOVE) ×1 IMPLANT
GLOVE BIOGEL PI INDICATOR 9 (GLOVE) ×2
GLOVE SURG ORTHO 9.0 STRL STRW (GLOVE) ×3 IMPLANT
GOWN STRL REUS W/ TWL LRG LVL3 (GOWN DISPOSABLE) ×1 IMPLANT
GOWN STRL REUS W/ TWL XL LVL3 (GOWN DISPOSABLE) ×3 IMPLANT
GOWN STRL REUS W/TWL LRG LVL3 (GOWN DISPOSABLE) ×2
GOWN STRL REUS W/TWL XL LVL3 (GOWN DISPOSABLE) ×6
GUIDEWIRE 3.2X290 (WIRE) ×3 IMPLANT
KIT BASIN OR (CUSTOM PROCEDURE TRAY) ×3 IMPLANT
KIT ROOM TURNOVER OR (KITS) ×3 IMPLANT
MANIFOLD NEPTUNE II (INSTRUMENTS) ×3 IMPLANT
NAIL HINDFOOT TI 10X150MM (Nail) ×3 IMPLANT
NS IRRIG 1000ML POUR BTL (IV SOLUTION) ×3 IMPLANT
PACK ORTHO EXTREMITY (CUSTOM PROCEDURE TRAY) ×3 IMPLANT
PAD ARMBOARD 7.5X6 YLW CONV (MISCELLANEOUS) ×6 IMPLANT
REAMER SHAFT BIXCUT (INSTRUMENTS) ×3 IMPLANT
SCREW LOCK STAR 5X28 (Screw) ×3 IMPLANT
SCREW LOCK STAR 6X68 (Screw) ×6 IMPLANT
SPONGE GAUZE 4X4 12PLY STER LF (GAUZE/BANDAGES/DRESSINGS) ×3 IMPLANT
SPONGE LAP 18X18 X RAY DECT (DISPOSABLE) ×3 IMPLANT
STAPLER VISISTAT 35W (STAPLE) IMPLANT
STOCKINETTE IMPERVIOUS 9X36 MD (GAUZE/BANDAGES/DRESSINGS) IMPLANT
SUCTION FRAZIER TIP 10 FR DISP (SUCTIONS) ×3 IMPLANT
SUT ETHILON 2 0 PSLX (SUTURE) ×9 IMPLANT
SUT VIC AB 0 CT1 27 (SUTURE)
SUT VIC AB 0 CT1 27XBRD ANBCTR (SUTURE) IMPLANT
SUT VIC AB 2-0 CT1 27 (SUTURE)
SUT VIC AB 2-0 CT1 TAPERPNT 27 (SUTURE) IMPLANT
TOWEL OR 17X24 6PK STRL BLUE (TOWEL DISPOSABLE) ×3 IMPLANT
TOWEL OR 17X26 10 PK STRL BLUE (TOWEL DISPOSABLE) ×3 IMPLANT
TUBE CONNECTING 12'X1/4 (SUCTIONS) ×1
TUBE CONNECTING 12X1/4 (SUCTIONS) ×2 IMPLANT
UNDERPAD 30X30 INCONTINENT (UNDERPADS AND DIAPERS) ×3 IMPLANT
WATER STERILE IRR 1000ML POUR (IV SOLUTION) ×3 IMPLANT

## 2015-03-16 NOTE — Progress Notes (Signed)
Orthopedic Tech Progress Note Patient Details:  Robin [name] 1950/07/23 [id number]  Ortho Devices Type of Ortho Device: CAM walker Ortho Device/Splint Location: lle Ortho Device/Splint Interventions: Application   [name] 03/16/2015, 2:57 PM

## 2015-03-16 NOTE — Anesthesia Preprocedure Evaluation (Addendum)
Anesthesia Evaluation    Reviewed: Allergy & Precautions, NPO status , Patient's Chart, lab work & pertinent test results  History of Anesthesia Complications Negative for: history of anesthetic complications  Airway Mallampati: III  TM Distance: >3 FB Neck ROM: Full    Dental no notable dental hx.    Pulmonary shortness of breath, sleep apnea , Current Smoker,    breath sounds clear to auscultation       Cardiovascular hypertension, Pt. on medications + CAD, + Cardiac Stents and +CHF   Rhythm:Regular     Neuro/Psych PSYCHIATRIC DISORDERS Anxiety Depression Bipolar Disorder negative neurological ROS     GI/Hepatic Neg liver ROS, GERD  Medicated and Controlled,  Endo/Other  diabetes, Type 2, Insulin DependentMorbid obesity  Renal/GU Renal Insufficiency and CRFRenal disease     Musculoskeletal  (+) Arthritis ,   Abdominal   Peds  Hematology  (+) anemia ,   Anesthesia Other Findings   Reproductive/Obstetrics                            Anesthesia Physical Anesthesia Plan  ASA: III  Anesthesia Plan: MAC and Regional   Post-op Pain Management:    Induction: Intravenous  Airway Management Planned: Nasal Cannula  Additional Equipment: None  Intra-op Plan:   Post-operative Plan:   Informed Consent: I have reviewed the patients History and Physical, chart, labs and discussed the procedure including the risks, benefits and alternatives for the proposed anesthesia with the patient or authorized representative who has indicated his/her understanding and acceptance.   Dental advisory given  Plan Discussed with: CRNA and Surgeon  Anesthesia Plan Comments:         Anesthesia Quick Evaluation

## 2015-03-16 NOTE — Addendum Note (Signed)
Addendum  created 03/16/15 1521 by Izora Gala, CRNA   Modules edited: Anesthesia Medication Administration

## 2015-03-16 NOTE — H&P (Signed)
Robin [name] is an 64 y.o. female.   Chief Complaint: Traumatic arthritis left ankle status post open reduction internal fixation. HPI: Patient is a 64 year old woman with diabetic insensate neuropathy who is status post open reduction internal fixation left ankle fracture. Patient has had progressive destructive changes consistent with Charcot arthropathy and presents at this time for tibial calcaneal fusion.  Past Medical History  Diagnosis Date  . Coronary artery disease   . Bipolar disorder ([place])   . History of blood transfusion   . Benign hypertension   . Anemia   . Heart murmur   . CHF (congestive heart failure) ([place])   . Sleep apnea   . Shortness of breath dyspnea   . History of pneumonia   . History of bronchitis   . Depression   . Anxiety   . GERD (gastroesophageal reflux disease)   . Arthritis   . Diabetes mellitus without complication ([employer])   . Type 2 diabetes mellitus ([place])   . Chronic kidney disease, stage V ([place])     Nephrologist is with [facility] (Dr. Tammi Klippel)    Past Surgical History  Procedure Laterality Date  . Cardiac catheterization      2 stent   . Av fistula placement Left may-2016    done at [facility]  . Tonsillectomy    . Abdominal hysterectomy    . Cholecystectomy    . Ankle closed reduction N/A 11/17/2014    Procedure: CLOSED REDUCTION ANKLE;  Surgeon: Earlie Server, MD;  Location: [place];  Service: Orthopedics;  Laterality: N/A;  . Orif ankle fracture Left 11/20/2014    Procedure: OPEN REDUCTION INTERNAL FIXATION (ORIF) ANKLE FRACTURE;  Surgeon: Renette Butters, MD;  Location: [place];  Service: Orthopedics;  Laterality: Left;  . Coronary stent placement      No family history on file. Social History:  reports that she has been smoking Cigarettes.  She has been smoking about 0.50 packs per day. She does not have any smokeless tobacco history on file. She reports that she does not drink alcohol or use illicit drugs.  Allergies:  Allergies   Allergen Reactions  . Oxycodone Other (See Comments)    Makes patient lethargic, sleeping for few days, altered mental status    No prescriptions prior to admission    No results found for this or any previous visit (from the past 48 hour(s)). No results found.  Review of Systems  All other systems reviewed and are negative.   There were no vitals taken for this visit. Physical Exam  Examination patient has palpable pulses. She has loss of reduction status post open reduction internal fixation. Assessment/Plan Assessment: Charcot collapse left ankle status post open reduction internal fixation for left ankle fracture.  Plan: We'll plan for removal of the deep retained hardware tibial calcaneal fusion due to the destructive changes of the subtalar joint as well. Risk and benefits were discussed patient states she understands and wishes to proceed at this time.  Marvelle Caudill V 03/16/2015, 6:51 AM

## 2015-03-16 NOTE — Op Note (Signed)
03/16/2015  1:55 PM  PATIENT:  Robin [name]    PRE-OPERATIVE DIAGNOSIS:  Charcot Collapse status post Open Reduction Internal Fixation Left Ankle  POST-OPERATIVE DIAGNOSIS:  Same  PROCEDURE:  Removal Hardware Left Ankle, Left Tibiocalcaneal Fusion  SURGEON:  Newt Minion, MD  PHYSICIAN ASSISTANT:None ANESTHESIA:   General  PREOPERATIVE INDICATIONS:  [name] is a  64 y.o. female with a diagnosis of Charcot Collapse status post Open Reduction Internal Fixation Left Ankle who failed conservative measures and elected for surgical management.    The risks benefits and alternatives were discussed with the patient preoperatively including but not limited to the risks of infection, bleeding, nerve injury, cardiopulmonary complications, the need for revision surgery, among others, and the patient was willing to proceed.  OPERATIVE IMPLANTS: Synthes 10 x 170 mm tibial calcaneal nail  OPERATIVE FINDINGS: Soft bone consistent with Charcot arthropathy  OPERATIVE PROCEDURE: Patient was brought to the operating room after undergoing a popliteal block. After adequate levels anesthesia obtained patient was placed in the right lateral decubitus position with the left side up and the left lower extremity was prepped using DuraPrep draped into a sterile field a timeout was called. A lateral incision was carried down to the fibula. The distal fibula was extremely soft bone the hardware and completely pulled out of the bone. The distal aspect the fibula was excised and the plate and screws were removed. A ostectomy was then performed of the distal aspect the tibia perpendicular to the long axis of the tibia. The medial malleolus an interfrag screws removed. There were 2 deep screws that were not removed as. The talar dome was then cut with a oscillating saw perpendicular to the long axis of the tibia. A guidewire was then inserted from the calcaneus into the tibia C-arm fluoroscopy verified alignment this  was overdrilled over the calcaneus with the 11 mm drill guidewire was inserted at the tibia and the tibia was then reamed up to 11.5 mm for the 10 mm nail. The nail was inserted. It was locked distally 2 from the calcaneus through the tibial calcaneal nail. The construct was then compressed and locked proximally with a 28 mm screw. C-arm floss be verified alignment both AP and lateral planes. The wound was irrigated throughout the case. The incisions were closed using 2-0 nylon. A sterile compressive dressing was applied. Patient was taken to the PACU in stable condition.

## 2015-03-16 NOTE — Anesthesia Procedure Notes (Addendum)
Anesthesia Regional Block:  Adductor canal block  Pre-Anesthetic Checklist: ,, timeout performed, Correct Patient, Correct Site, Correct Laterality, Correct Procedure, Correct Position, site marked, Risks and benefits discussed, Surgical consent,  Pre-op evaluation,  At surgeon's request  Laterality: Lower and Left  Prep: chloraprep       Needles:  Injection technique: Single-shot  Needle Type: Echogenic Stimulator Needle          Additional Needles:  Procedures: ultrasound guided (picture in chart) Adductor canal block Narrative:  Injection made incrementally with aspirations every 5 mL.  Performed by: Personally  Anesthesiologist: Payson Evrard  Additional Notes: H+P and labs reviewed, risks and benefits discussed with patient, procedure tolerated well without complications   Anesthesia Regional Block:  Popliteal block  Pre-Anesthetic Checklist: ,, timeout performed, Correct Patient, Correct Site, Correct Laterality, Correct Procedure, Correct Position, site marked, Risks and benefits discussed, Surgical consent,  Pre-op evaluation,  At surgeon's request  Laterality: Lower and Left  Prep: chloraprep       Needles:  Injection technique: Single-shot  Needle Type: Echogenic Stimulator Needle          Additional Needles:  Procedures: ultrasound guided (picture in chart) and nerve stimulator Popliteal block  Nerve Stimulator or Paresthesia:  Response: plantar, 0.5 mA,   Additional Responses:   Narrative:  Injection made incrementally with aspirations every 5 mL.  Performed by: Personally  Anesthesiologist: Gibran Veselka  Additional Notes: H+P and labs reviewed, risks and benefits discussed with patient, procedure tolerated well without complications

## 2015-03-16 NOTE — Transfer of Care (Signed)
Immediate Anesthesia Transfer of Care Note  Patient: Robin [name]  Procedure(s) Performed: Procedure(s): Removal Hardware Left Ankle (Left) Left Tibiocalcaneal Fusion (Left)  Patient Location: PACU  Anesthesia Type:MAC and Regional  Level of Consciousness: awake, sedated and patient cooperative  Airway & Oxygen Therapy: Patient Spontanous Breathing and Patient connected to nasal cannula oxygen  Post-op Assessment: Report given to RN and Post -op Vital signs reviewed and stable  Post vital signs: Reviewed and stable  Last Vitals:  Filed Vitals:   03/16/15 1202  BP: 161/63  Pulse: 92  Temp:   Resp: 11    Complications: No apparent anesthesia complications

## 2015-03-16 NOTE — Anesthesia Postprocedure Evaluation (Signed)
  Anesthesia Post-op Note  Patient: Robin [name]  Procedure(s) Performed: Procedure(s): Removal Hardware Left Ankle (Left) Left Tibiocalcaneal Fusion (Left)  Patient Location: PACU  Anesthesia Type:MAC and Regional  Level of Consciousness: awake  Airway and Oxygen Therapy: Patient Spontanous Breathing  Post-op Pain: none  Post-op Assessment: Post-op Vital signs reviewed, Patient's Cardiovascular Status Stable, Respiratory Function Stable, Patent Airway, No signs of Nausea or vomiting and Pain level controlled              Post-op Vital Signs: Reviewed and stable  Last Vitals:  Filed Vitals:   03/16/15 1430  BP: 158/78  Pulse: 73  Temp:   Resp: 19    Complications: No apparent anesthesia complications

## 2015-03-17 LAB — GLUCOSE, CAPILLARY
GLUCOSE-CAPILLARY: 213 mg/dL — AB (ref 65–99)
GLUCOSE-CAPILLARY: 183 mg/dL — AB (ref 65–99)
GLUCOSE-CAPILLARY: 97 mg/dL (ref 65–99)
GLUCOSE-CAPILLARY: 87 mg/dL (ref 65–99)
Glucose-Capillary: 193 mg/dL — ABNORMAL HIGH (ref 65–99)
Glucose-Capillary: 122 mg/dL — ABNORMAL HIGH (ref 65–99)
Glucose-Capillary: 57 mg/dL — ABNORMAL LOW (ref 65–99)

## 2015-03-17 MED ORDER — POLYETHYLENE GLYCOL 3350 17 G PO PACK
17.0000 g | PACK | Freq: Every day | ORAL | Status: DC | PRN
Start: 2015-03-17 — End: 2015-03-20
  Administered 2015-03-18 – 2015-03-19 (×2): 17 g via ORAL
  Filled 2015-03-17 (×2): qty 1

## 2015-03-17 MED ORDER — DEXTROSE 50 % IV SOLN
INTRAVENOUS | Status: AC
  Administered 2015-03-17: 50 mL
  Filled 2015-03-17: qty 50

## 2015-03-17 MED ORDER — MAGNESIUM CITRATE PO SOLN: 1.0000 | Freq: Once | ORAL | Status: DC

## 2015-03-17 MED ORDER — TRAMADOL HCL 50 MG PO TABS
50.0000 mg | ORAL_TABLET | Freq: Four times a day (QID) | ORAL | Status: DC | PRN
  Administered 2015-03-17 – 2015-03-18 (×3): 50 mg via ORAL
  Filled 2015-03-17 (×3): qty 1

## 2015-03-17 MED ORDER — BISACODYL 5 MG PO TBEC
5.0000 mg | DELAYED_RELEASE_TABLET | Freq: Every day | ORAL | Status: DC | PRN
  Administered 2015-03-17 – 2015-03-19 (×2): 5 mg via ORAL
  Filled 2015-03-17 (×2): qty 1

## 2015-03-17 NOTE — Progress Notes (Signed)
[name], female   DOB: May 22, 1951, 64 y.o.   MRN: [id number] Patient very lethargic with taking Vicodin and Percocet. Will switch her over to tramadol. Dressing is clean and dry at this time.

## 2015-03-17 NOTE — Progress Notes (Signed)
PT Cancellation Note  Patient Details Name: Robin [name] MRN: [id number] DOB: January 29, 1951   Cancelled Treatment:    Reason Eval/Treat Not Completed: Pain limiting ability to participate;Fatigue/lethargy limiting ability to participate   Greer Ee 03/17/2015, 5:14 PM

## 2015-03-17 NOTE — Progress Notes (Addendum)
Hypoglycemic Event  CBG: 57 @ 1713  Treatment: D50 IV 25 mL  Symptoms: Pale, Sweaty and Shaky  Follow-up CBG: Time:1741 CBG Result:*183  Possible Reasons for Event: Inadequate meal intake  Comments: will monitor patient closely    Robin [name]

## 2015-03-17 NOTE — Care Management Note (Signed)
Case Management Note  Patient Details  Name: Robin [name] MRN: [id number] Date of Birth: 14-Oct-1950  Subjective/Objective:                                   Chief Complaint: Traumatic arthritis left ankle status post open reduction internal fixation.  Action/Plan: Discharge disposition pending. PT consult pending.  Per MD order, SW consulted for possible placement to [facility]. CM will continue to follow for discharge planning needs.      Expected Discharge Date:                  Expected Discharge Plan:  Skilled Nursing Facility  In-House Referral:  Clinical Social Work  Discharge planning Services  CM Consult  Post Acute Care Choice:    Choice offered to:     DME Arranged:    DME Agency:     HH Arranged:    [place] Agency:     Status of Service:  In process, will continue to follow  Medicare Important Message Given:    Date Medicare IM Given:    Medicare IM give by:    Date Additional Medicare IM Given:    Additional Medicare Important Message give by:     If discussed at [place] of Stay Meetings, dates discussed:    Additional Comments:  Guido Sander, RN 03/17/2015, 4:37 PM

## 2015-03-17 NOTE — Progress Notes (Signed)
Patient has had large amounts of bleeding from surgical site tonight. Dr. Sharol Given made aware. He ordered removing dressing and covering with 4x4's, abd pad, Kerlix, and ace. Patient has also spiked a fever. Tylenol given and instructions given to patient and daughter on incentive spirometer. Will continue to monitor both surgical site and temp as night goes on. Robin Molly, RN

## 2015-03-18 LAB — GLUCOSE, CAPILLARY
GLUCOSE-CAPILLARY: 44 mg/dL — AB (ref 65–99)
GLUCOSE-CAPILLARY: 134 mg/dL — AB (ref 65–99)
Glucose-Capillary: 134 mg/dL — ABNORMAL HIGH (ref 65–99)
Glucose-Capillary: 118 mg/dL — ABNORMAL HIGH (ref 65–99)
Glucose-Capillary: 157 mg/dL — ABNORMAL HIGH (ref 65–99)
Glucose-Capillary: 208 mg/dL — ABNORMAL HIGH (ref 65–99)

## 2015-03-18 MED ORDER — QUETIAPINE FUMARATE ER 300 MG PO TB24
600.0000 mg | ORAL_TABLET | Freq: Every day | ORAL | Status: DC
  Administered 2015-03-18 – 2015-03-19 (×2): 600 mg via ORAL
  Filled 2015-03-18 (×3): qty 2

## 2015-03-18 MED ORDER — DEXTROSE 50 % IV SOLN
INTRAVENOUS | Status: AC
  Administered 2015-03-18: 25 mL
  Filled 2015-03-18: qty 50

## 2015-03-18 NOTE — Evaluation (Signed)
Physical Therapy Evaluation Patient Details Name: Robin [name] MRN: [id number] DOB: 11/17/1950 Today's Date: 03/18/2015   History of Present Illness  64 yo female L charcot foot collapse, removal of old hardware and tibiocalcaneal fusion.  Insensate DM, CAD.  Clinical Impression  Patient is s/p above surgery resulting in functional limitations due to the deficits listed below (see PT Problem List).  Patient will benefit from skilled PT to increase their independence and safety with mobility to allow discharge to the venue listed below.       Follow Up Recommendations SNF    Equipment Recommendations  3in1 (PT);None recommended by PT    Recommendations for Other Services       Precautions / Restrictions Precautions Precautions: Fall Other Brace/Splint: CAM boot (Family is bringing in her existing CAM boot) Restrictions LLE Weight Bearing: Non weight bearing      Mobility  Bed Mobility Overal bed mobility: Needs Assistance Bed Mobility: Supine to Sit     Supine to sit: Mod assist     General bed mobility comments: Mod assist to come to fully upright sitting; once sittingup, tends to lean heavily right as if to lie back down; verbal and tactile cueing to stay sitting upright  Transfers Overall transfer level: Needs assistance Equipment used: 1 person hand held assist Transfers: Stand Pivot Transfers   Stand pivot transfers: Min assist       General transfer comment: min assist with basice stand pivot transfer OOB, standing on R foot; Difficulty keeping weight off of LLE  Ambulation/Gait                Stairs            Wheelchair Mobility    Modified Rankin (Stroke Patients Only)       Balance Overall balance assessment: Needs assistance   Sitting balance-Leahy Scale: Poor       Standing balance-Leahy Scale: Poor                               Pertinent Vitals/Pain Pain Assessment: Faces Pain Score: 7  Faces Pain Scale:  Hurts little more Pain Location: L foot Pain Descriptors / Indicators: Aching;Grimacing Pain Intervention(s): Limited activity within patient's tolerance;Monitored during session (Spoke with RN re: getting non-narcotic pain meds)    Home Living Family/patient expects to be discharged to:: Private residence Living Arrangements: Alone Available Help at Discharge: Family;Available PRN/intermittently (pt reports her children come over 3 X per day to help with meals) Type of Home: House Home Access: Stairs to enter Entrance Stairs-Rails: None Entrance Stairs-Number of Steps: 3 Home Layout: Multi-level;Able to live on main level with bedroom/bathroom Home Equipment: Gilford Rile - 2 wheels;Walker - 4 wheels;Wheelchair - [employer];Tub bench Additional Comments: Noted Dr. Sharol Given intends for pt to dc to SNF; PT in agreement    Prior Function Level of Independence: Independent with assistive device(s);Needs assistance   Gait / Transfers Assistance Needed: mod I with RW for limited distance since Left ankle sx  ADL's / Homemaking Assistance Needed: family has been doing the shopping, cooking and cleaning. pt sponge bathes and toilets independently        Hand Dominance   Dominant Hand: Right    Extremity/Trunk Assessment   Upper Extremity Assessment: Generalized weakness           Lower Extremity Assessment: LLE deficits/detail;Generalized weakness   LLE Deficits / Details: Foot and ankle wrapped in bulky dressing  Communication   Communication: No difficulties  Cognition Arousal/Alertness: Awake/alert Behavior During Therapy: WFL for tasks assessed/performed;Flat affect Overall Cognitive Status: Within Functional Limits for tasks assessed                      General Comments      Exercises        Assessment/Plan    PT Assessment Patient needs continued PT services  PT Diagnosis Difficulty walking;Generalized weakness;Abnormality of gait   PT  Problem List Decreased strength;Decreased range of motion;Decreased activity tolerance;Decreased balance;Decreased mobility;Decreased knowledge of use of DME;Decreased safety awareness;Decreased knowledge of precautions;Pain  PT Treatment Interventions DME instruction;Gait training;Functional mobility training;Therapeutic activities;Therapeutic exercise;Balance training;Patient/family education   PT Goals (Current goals can be found in the Care Plan section) Acute Rehab PT Goals Patient Stated Goal: did not state PT Goal Formulation:  (Pt sis not participate in goal setting) Time For Goal Achievement: 04/01/15 Potential to Achieve Goals: Fair    Frequency Min 3X/week   Barriers to discharge   Noted Dr. Sharol Given intends for pt to dc to SNF for rehab, and PT is in full agreement; Pt indicated at one point during teh session that she does not want to go to a SNF    Co-evaluation               End of Session Equipment Utilized During Treatment: Gait belt Activity Tolerance: Patient tolerated treatment well Patient left: in chair;with call bell/phone within reach Nurse Communication: Mobility status         Time: 1211-1230 PT Time Calculation (min) (ACUTE ONLY): 19 min   Charges:   PT Evaluation $Initial PT Evaluation Tier I: 1 Procedure     PT G CodesRoney Marion Hamff 03/18/2015, 1:48 PM  Roney Marion, [place] Pager [phone] Office [phone]

## 2015-03-18 NOTE — Progress Notes (Signed)
[name], female   DOB: [birth year], 64 y.o.   MRN: [id number] Patient is not making any progress with mobilization. Orders are written for discharge to skilled nursing.

## 2015-03-18 NOTE — Progress Notes (Signed)
Pt has been resting during the evening but was noted to have muscle twitching in arms and legs. She is slow to respond to questions but does eventually answer appropriately. She has bilateral equal grips. She swallows well. Her daughter stated that the twitching is from a low blood sugar. Food and drink were given and the twitching stopped (cbg 87). Daughter also states that her mother always says her pain is a ten, so she agreed we would not give the IV dilaudid the rest of the night.

## 2015-03-18 NOTE — Progress Notes (Signed)
CM spoke to [place] with SW to advise of referral. Information accepted.

## 2015-03-18 NOTE — Progress Notes (Signed)
Utilization review completed.  

## 2015-03-18 NOTE — Progress Notes (Signed)
Hypoglycemic Event  CBG: 44  Treatment: D50 IV 25 mL  Symptoms: None  Follow-up CBG: [id number] CBG Result:118  Possible Reasons for Event:   Comments/MD notified:no    Robin [name] A

## 2015-03-18 NOTE — Progress Notes (Signed)
CM called and left CSW a message @ [phone] / [phone] to advise of SW consult for SNF placement.

## 2015-03-19 ENCOUNTER — Encounter (HOSPITAL_COMMUNITY): Payer: Self-pay | Admitting: Orthopedic Surgery

## 2015-03-19 LAB — GLUCOSE, CAPILLARY
GLUCOSE-CAPILLARY: 193 mg/dL — AB (ref 65–99)
GLUCOSE-CAPILLARY: 155 mg/dL — AB (ref 65–99)
GLUCOSE-CAPILLARY: 175 mg/dL — AB (ref 65–99)
GLUCOSE-CAPILLARY: 190 mg/dL — AB (ref 65–99)
Glucose-Capillary: 151 mg/dL — ABNORMAL HIGH (ref 65–99)
Glucose-Capillary: 181 mg/dL — ABNORMAL HIGH (ref 65–99)

## 2015-03-19 MED ORDER — MAGNESIUM CITRATE PO SOLN
1.0000 | Freq: Once | ORAL | Status: AC
  Administered 2015-03-19: 1 via ORAL
  Filled 2015-03-19: qty 296

## 2015-03-19 MED ORDER — ASPIRIN EC 325 MG PO TBEC: 325.0000 mg | DELAYED_RELEASE_TABLET | Freq: Every day | ORAL | Status: DC

## 2015-03-19 MED ORDER — TRAMADOL HCL 50 MG PO TABS: 50.0000 mg | ORAL_TABLET | Freq: Four times a day (QID) | ORAL | Status: DC | PRN

## 2015-03-19 MED ORDER — FLEET ENEMA 7-19 GM/118ML RE ENEM
1.0000 | ENEMA | Freq: Once | RECTAL | Status: AC
  Administered 2015-03-19: 1 via RECTAL
  Filled 2015-03-19: qty 1

## 2015-03-19 NOTE — Care Management Important Message (Signed)
Important Message  Patient Details  Name: Robin [name] MRN: [id number] Date of Birth: 1951-02-11   Medicare Important Message Given:  Yes-second notification given    Delorse Lek 03/19/2015, 12:24 PM

## 2015-03-19 NOTE — Progress Notes (Signed)
Pt very lethargic and difficult to arouse this morning, but is able to follow simple commands. Pt also has not had a BM since 10/16. Daughter at bedside and confirms that sleepiness is typical after surgery. Abdomen is distended, tender and has hypoactive bowel sounds. Called Dr. Sharol Given and explained situation, received an order for Magnesium Citrate and to hold discharge orders at this time. Will continue to monitor. Zola Button

## 2015-03-19 NOTE — Progress Notes (Signed)
Pt more alert and able to give short verbal responses. Still has not had a BM even after receiving magnesium citrate and dulcolax. Notified Dr. Sharol Given and received verbal conformation to put in orders for a fleet enema. Will continue to monitor. Zola Button, RN

## 2015-03-19 NOTE — Clinical Social Work Note (Signed)
Patient to be discharged to SNF once medically stable. Patient's daughter requesting [facility], however, no bed available. CSW provided patient's daughter with other SNF beds available in [facility]. Patient's daughter to review bed offers and contact CSW regarding final bed choice.  Full assessment to follow.  Lubertha Sayres, [place] Clinical Social Work Department Orthopedics [phone]) and Surgical [phone])

## 2015-03-19 NOTE — Care Management Important Message (Signed)
Important Message  Patient Details  Name: Robin [name] MRN: [id number] Date of Birth: 1950/12/17   Medicare Important Message Given:  Yes-second notification given    Delorse Lek 03/19/2015, 12:25 PM

## 2015-03-19 NOTE — Discharge Summary (Signed)
Physician Discharge Summary  [name] MRN: [id number] DOB/AGE: 06-25-1950 65 y.o.  Admit date: 03/16/2015 Discharge date: 03/19/2015  Admission Diagnoses: Charcot collapse left ankle  Discharge Diagnoses:  Active Problems:   Charcot ankle   Discharged Condition: stable  Hospital Course: Patient's hospital course was essentially unremarkable. She underwent takedown of the nonunion union Charcot collapse left ankle and tibial calcaneal fusion. Postoperatively patient progressed slowly and was discharged to rehabilitation in stable condition  Consults: None  Significant Diagnostic Studies: labs: Routine labs  Treatments: surgery: See operative note  Discharge Exam: Blood pressure 134/57, pulse 85, temperature 99.8 F (37.7 C), temperature source Oral, resp. rate 18, height 5\' 4"  (1.626 m), weight 77.928 kg (171 lb 12.8 oz), SpO2 92 %. Incision/Wound: dressing clean and dry  Disposition: 06-Home-Health Care Svc  Discharge Instructions    Call MD / Call 911    Complete by:  As directed   If you experience chest pain or shortness of breath, CALL 911 and be transported to the hospital emergency room.  If you develope a fever above 101 F, pus (white drainage) or increased drainage or redness at the wound, or calf pain, call your surgeon's office.     Constipation Prevention    Complete by:  As directed   Drink plenty of fluids.  Prune juice may be helpful.  You may use a stool softener, such as Colace (over the counter) 100 mg twice a day.  Use MiraLax (over the counter) for constipation as needed.     Diet - low sodium heart healthy    Complete by:  As directed      Increase activity slowly as tolerated    Complete by:  As directed      Non weight bearing    Complete by:  As directed   Laterality:  left  Extremity:  Lower            Medication List    STOP taking these medications        HYDROcodone-acetaminophen 5-325 MG tablet  Commonly known as:   NORCO/VICODIN      TAKE these medications        albuterol 108 (90 BASE) MCG/ACT inhaler  Commonly known as:  PROVENTIL HFA;VENTOLIN HFA  Inhale 1-2 puffs into the lungs every 6 (six) hours as needed for wheezing or shortness of breath.     amLODipine 10 MG tablet  Commonly known as:  NORVASC  Take 10 mg by mouth daily.     aspirin EC 81 MG tablet  Take 81 mg by mouth daily.     aspirin EC 325 MG tablet  Take 1 tablet (325 mg total) by mouth daily.     atorvastatin 80 MG tablet  Commonly known as:  LIPITOR  Take 40 mg by mouth every evening.     benztropine 0.5 MG tablet  Commonly known as:  COGENTIN  Take 0.5 mg by mouth 2 (two) times daily.     bumetanide 2 MG tablet  Commonly known as:  BUMEX  Take 4 mg by mouth 2 (two) times daily.     carvedilol 25 MG tablet  Commonly known as:  COREG  Take 25 mg by mouth 2 (two) times daily with a meal.     darbepoetin 40 MCG/0.4ML Soln injection  Commonly known as:  ARANESP  Inject 40 mcg into the skin every 14 (fourteen) days. 1st and the 15th of each month.     docusate sodium  100 MG capsule  Commonly known as:  COLACE  Take 1 capsule (100 mg total) by mouth 2 (two) times daily.     fluPHENAZine 1 MG tablet  Commonly known as:  PROLIXIN  Take 1 mg by mouth 2 (two) times daily.     gabapentin 300 MG capsule  Commonly known as:  NEURONTIN  Take 300 mg by mouth 2 (two) times daily.     hydrALAZINE 100 MG tablet  Commonly known as:  APRESOLINE  Take 100 mg by mouth 2 (two) times daily.     insulin aspart 100 UNIT/ML injection  Commonly known as:  novoLOG  Inject 7-15 Units into the skin 3 (three) times daily before meals. Sliding scale     insulin detemir 100 UNIT/ML injection  Commonly known as:  LEVEMIR  Inject 0.12 mLs (12 Units total) into the skin at bedtime.     isosorbide mononitrate 60 MG 24 hr tablet  Commonly known as:  IMDUR  Take 60 mg by mouth daily.     pantoprazole 20 MG tablet  Commonly known  as:  PROTONIX  Take 20 mg by mouth daily.     QUEtiapine 100 MG tablet  Commonly known as:  SEROQUEL  Take 2 tablets (200 mg total) by mouth at bedtime.     traMADol 50 MG tablet  Commonly known as:  ULTRAM  Take 1 tablet (50 mg total) by mouth every 6 (six) hours as needed.     traMADol 50 MG tablet  Commonly known as:  ULTRAM  Take 1 tablet (50 mg total) by mouth every 6 (six) hours as needed. Maximum dose= 8 tablets per day           Follow-up Information    Follow up with Anden Bartolo V, MD In 2 weeks.   Specialty:  Orthopedic Surgery   Contact information:   [place] [address] [phone]       Signed: Newt Minion 03/19/2015, 6:40 AM

## 2015-03-20 ENCOUNTER — Encounter (HOSPITAL_COMMUNITY): Payer: Self-pay | Admitting: Orthopedic Surgery

## 2015-03-20 LAB — GLUCOSE, CAPILLARY
GLUCOSE-CAPILLARY: 73 mg/dL (ref 65–99)
Glucose-Capillary: 203 mg/dL — ABNORMAL HIGH (ref 65–99)

## 2015-03-20 MED ORDER — FLEET ENEMA 7-19 GM/118ML RE ENEM
1.0000 | ENEMA | Freq: Once | RECTAL | Status: AC
  Administered 2015-03-20: 1 via RECTAL
  Filled 2015-03-20: qty 1

## 2015-03-20 NOTE — Clinical Social Work Placement (Signed)
   CLINICAL SOCIAL WORK PLACEMENT  NOTE  Date:  03/20/2015  Patient Details  Name: Robin [name] MRN: [id number] Date of Birth: [birth year]  Clinical Social Work is seeking post-discharge placement for this patient at the [place] level of care (*CSW will initial, date and re-position this form in  chart as items are completed):  Yes   Patient/family provided with [facility] Work Department's list of facilities offering this level of care within the geographic area requested by the patient (or if unable, by the patient's family).  Yes   Patient/family informed of their freedom to choose among providers that offer the needed level of care, that participate in Medicare, Medicaid or managed care program needed by the patient, have an available bed and are willing to accept the patient.  Yes   Patient/family informed of [facility]'s ownership interest in [facility] and [facility], as well as of the fact that they are under no obligation to receive care at these facilities.  PASRR submitted to EDS on  (n/a)     PASRR number received on  (n/a)     Existing PASRR number confirmed on 03/19/15     FL2 transmitted to all facilities in geographic area requested by pt/family on 03/19/15     FL2 transmitted to all facilities within larger geographic area on  (n/a)     Patient informed that his/her managed care company has contracts with or will negotiate with certain facilities, including the following:   (yes)     Yes   Patient/family informed of bed offers received.  Patient chooses bed at [facility]     Physician recommends and patient chooses bed at      Patient to be transferred to [facility] on 03/20/15.  Patient to be transferred to facility by Daughter     Patient family notified on 03/20/15 of transfer.  Name of family member notified:  Theda Sers     PHYSICIAN       Additional Comment:     _______________________________________________ Caroline Sauger, LCSW 03/20/2015, 11:27 AM

## 2015-03-20 NOTE — Discharge Planning (Signed)
Patient to be discharged to [facility]. Patient's daughter updated regarding discharge.  Facility: [facility] RN report number: [phone] Transportation: [place], [place] [phone]) and Surgical [phone])

## 2015-03-20 NOTE — Progress Notes (Addendum)
[name], female   DOB: 1950-10-29, 64 y.o.   MRN: [id number] Patient's mental status has improved. Patient's daughter states that her mother has had episodes that she is experiencing today in the past. Patient with minimal results from the enema but she is passing gas. We'll try another enema today. Patient is stable for discharge to skilled nursing when bed is available.

## 2015-03-20 NOTE — Progress Notes (Signed)
Per CSW, patient's daughter Tresure Barletta requested that I call her at [phone] regarding question about the Medicare Important Message. Contacted Ms.  Jenniges and answered her questions about the Medicare IM.

## 2015-03-20 NOTE — Clinical Social Work Note (Signed)
Clinical Social Work Assessment  Patient Details  Name: Robin [name] MRN: [id number] Date of Birth: 1951-01-04  Date of referral:  03/20/15               Reason for consult:  Facility Placement, Discharge Planning                Permission sought to share information with:  Facility Sport and exercise psychologist, Family Supports Permission granted to share information::  Yes, Verbal Permission Granted  Name::     Lashica Zachar  Agency::  [facility]  Relationship::  Daughter  Contact Information:  [phone]  Housing/Transportation Living arrangements for the past 2 months:  Cope of Information:  Adult Children Patient Interpreter Needed:  None Criminal Activity/Legal Involvement Pertinent to Current Situation/Hospitalization:  No - Comment as needed Significant Relationships:  Adult Children Lives with:  Self Do you feel safe going back to the place where you live?  No (High fall risk.) Need for family participation in patient care:  Yes (Comment) (Patient's daughter active in patient's care.)  Care giving concerns:  Patient's daughter expressed no concerns at this time.   Social Worker assessment / plan:  CSW received referral for possible SNF placement at time of discharge. CSW spoke with patient's daughter regarding discharge disposition. Patient's daughter initially requesting [facility], however, no bed available. CSW presented remaining [name] bed offers. CSW to continue to follow and assist with discharge planning needs.  Employment status:  Retired Forensic scientist:  Medicare PT Recommendations:  [place] / Referral to community resources:  [place]  Patient/Family's Response to care:  Patient's daughter understanding and agreeable to CSW plan of care.  Patient/Family's Understanding of and Emotional Response to Diagnosis, Current Treatment, and Prognosis:  Patient's daughter  understanding and agreeable to CSW plan of care.  Emotional Assessment Appearance:  Appears stated age Attitude/Demeanor/Rapport:  Other (Patient disoriented x4, CSW spoke with patient's daughter.) Affect (typically observed):  Other (Patient disoriented x4, CSW spoke with patient's daughter.) Orientation:    Alcohol / Substance use:  Not Applicable Psych involvement (Current and /or in the community):  No (Comment) (Not appropriate on this admission.)  Discharge Needs  Concerns to be addressed:  No discharge needs identified Readmission within the last 30 days:  No Current discharge risk:  None Barriers to Discharge:  No Barriers Identified   Caroline Sauger, LCSW 03/20/2015, 11:23 AM [phone]

## 2015-03-23 DIAGNOSIS — N184 Chronic kidney disease, stage 4 (severe): Secondary | ICD-10-CM | POA: Diagnosis not present

## 2015-03-23 DIAGNOSIS — D631 Anemia in chronic kidney disease: Secondary | ICD-10-CM | POA: Diagnosis not present

## 2015-03-23 DIAGNOSIS — N189 Chronic kidney disease, unspecified: Secondary | ICD-10-CM

## 2015-03-26 ENCOUNTER — Non-Acute Institutional Stay (SKILLED_NURSING_FACILITY): Payer: Medicare Other | Admitting: Internal Medicine

## 2015-03-26 ENCOUNTER — Non-Acute Institutional Stay: Payer: Medicare Other | Admitting: Internal Medicine

## 2015-03-26 ENCOUNTER — Encounter: Payer: Self-pay | Admitting: Internal Medicine

## 2015-03-26 DIAGNOSIS — E1122 Type 2 diabetes mellitus with diabetic chronic kidney disease: Secondary | ICD-10-CM

## 2015-03-26 DIAGNOSIS — IMO0002 Reserved for concepts with insufficient information to code with codable children: Secondary | ICD-10-CM

## 2015-03-26 DIAGNOSIS — E785 Hyperlipidemia, unspecified: Secondary | ICD-10-CM

## 2015-03-26 DIAGNOSIS — N189 Chronic kidney disease, unspecified: Secondary | ICD-10-CM

## 2015-03-26 DIAGNOSIS — I5042 Chronic combined systolic (congestive) and diastolic (congestive) heart failure: Secondary | ICD-10-CM

## 2015-03-26 DIAGNOSIS — D631 Anemia in chronic kidney disease: Secondary | ICD-10-CM

## 2015-03-26 DIAGNOSIS — IMO0001 Reserved for inherently not codable concepts without codable children: Secondary | ICD-10-CM

## 2015-03-26 DIAGNOSIS — E1165 Type 2 diabetes mellitus with hyperglycemia: Secondary | ICD-10-CM

## 2015-03-26 DIAGNOSIS — N184 Chronic kidney disease, stage 4 (severe): Secondary | ICD-10-CM

## 2015-03-26 DIAGNOSIS — M14672 Charcot's joint, left ankle and foot: Secondary | ICD-10-CM

## 2015-03-26 DIAGNOSIS — F3170 Bipolar disorder, currently in remission, most recent episode unspecified: Secondary | ICD-10-CM

## 2015-03-26 DIAGNOSIS — N183 Chronic kidney disease, stage 3 (moderate): Secondary | ICD-10-CM

## 2015-03-26 DIAGNOSIS — I2510 Atherosclerotic heart disease of native coronary artery without angina pectoris: Secondary | ICD-10-CM

## 2015-03-26 DIAGNOSIS — Z794 Long term (current) use of insulin: Secondary | ICD-10-CM

## 2015-03-26 HISTORY — DX: Hyperlipidemia, unspecified: E78.5

## 2015-03-26 NOTE — Assessment & Plan Note (Signed)
SNF - d/c Hb 11.6; will follow CBC

## 2015-03-26 NOTE — Progress Notes (Addendum)
MRN: [id number] Name: Robin [name]  Sex: female Age: 64 y.o. DOB: 03/04/1951  [place] #: [facility] Facility/Room:108 Level Of Care: SNF Provider: Inocencio Homes D Emergency Contacts: Extended Emergency Contact Information Primary Emergency Contact: Barcellos,[name] Address: [address]          [place], [place] [address] [place] Phone: [phone] Relation: Daughter  Code Status:   Allergies: Oxycodone  Chief Complaint  Patient presents with  . New Admit To SNF    HPI: Patient is 64 y.o. female with diabetic insensate neuropathy who is status post open reduction internal fixation left ankle fracture. Patient has had progressive destructive changes consistent with Charcot arthropathy and presents at this time for tibial calcaneal fusion. Pt was admitted to the hospital from 10/21-24 for above surgery and rehab. Pt is admitted to SNF for further OT/PT. While at SNF pt will be followed for bipplar dx, tx withcogentin,seroquel and prolixin, CHF, tx with bumex and coreg and HLD, tx with zocor.  Past Medical History  Diagnosis Date  . Coronary artery disease   . Bipolar disorder ([place])   . History of blood transfusion   . Benign hypertension   . Anemia   . Heart murmur   . CHF (congestive heart failure) ([place])   . Sleep apnea   . Shortness of breath dyspnea   . History of pneumonia   . History of bronchitis   . Depression   . Anxiety   . GERD (gastroesophageal reflux disease)   . Arthritis   . Diabetes mellitus without complication ([place])   . Type 2 diabetes mellitus ([place])   . Chronic kidney disease, stage V ([place])     Nephrologist is with [facility] (Dr. Tammi Klippel)    Past Surgical History  Procedure Laterality Date  . Cardiac catheterization      2 stent   . Av fistula placement Left may-2016    done at [facility]  . Tonsillectomy    . Abdominal hysterectomy    . Cholecystectomy    . Ankle closed reduction N/A 11/17/2014    Procedure: CLOSED  REDUCTION ANKLE;  Surgeon: Earlie Server, MD;  Location: [facility];  Service: Orthopedics;  Laterality: N/A;  . Orif ankle fracture Left 11/20/2014    Procedure: OPEN REDUCTION INTERNAL FIXATION (ORIF) ANKLE FRACTURE;  Surgeon: Renette Butters, MD;  Location: [facility];  Service: Orthopedics;  Laterality: Left;  . Coronary stent placement    . Hardware removal Left 03/16/2015    Procedure: Removal Hardware Left Ankle;  Surgeon: Newt Minion, MD;  Location: [place] [facility];  Service: Orthopedics;  Laterality: Left;  . Ankle fusion Left 03/16/2015    Procedure: Left Tibiocalcaneal Fusion;  Surgeon: Newt Minion, MD;  Location: [facility];  Service: Orthopedics;  Laterality: Left;      Medication List       This list is accurate as of: 03/26/15  1:03 PM.  Always use your most recent med list.               albuterol 108 (90 BASE) MCG/ACT inhaler  Commonly known as:  PROVENTIL HFA;VENTOLIN HFA  Inhale 1-2 puffs into the lungs every 6 (six) hours as needed for wheezing or shortness of breath.     amLODipine 10 MG tablet  Commonly known as:  NORVASC  Take 10 mg by mouth daily.     aspirin EC 81 MG tablet  Take 81 mg by mouth daily.     aspirin  EC 325 MG tablet  Take 1 tablet (325 mg total) by mouth daily.     atorvastatin 80 MG tablet  Commonly known as:  LIPITOR  Take 40 mg by mouth every evening.     benztropine 0.5 MG tablet  Commonly known as:  COGENTIN  Take 0.5 mg by mouth 2 (two) times daily.     bumetanide 2 MG tablet  Commonly known as:  BUMEX  Take 4 mg by mouth 2 (two) times daily.     carvedilol 25 MG tablet  Commonly known as:  COREG  Take 25 mg by mouth 2 (two) times daily with a meal.     darbepoetin 40 MCG/0.4ML Soln injection  Commonly known as:  ARANESP  Inject 40 mcg into the skin every 14 (fourteen) days. 1st and the 15th of each month.     docusate sodium 100 MG capsule  Commonly known as:  COLACE  Take 1 capsule (100 mg total) by mouth 2 (two) times daily.      fluPHENAZine 1 MG tablet  Commonly known as:  PROLIXIN  Take 1 mg by mouth 2 (two) times daily.     gabapentin 300 MG capsule  Commonly known as:  NEURONTIN  Take 300 mg by mouth 2 (two) times daily.     hydrALAZINE 100 MG tablet  Commonly known as:  APRESOLINE  Take 100 mg by mouth 2 (two) times daily.     insulin aspart 100 UNIT/ML injection  Commonly known as:  novoLOG  Inject 7-15 Units into the skin 3 (three) times daily before meals. Sliding scale     insulin detemir 100 UNIT/ML injection  Commonly known as:  LEVEMIR  Inject 0.12 mLs (12 Units total) into the skin at bedtime.     isosorbide mononitrate 60 MG 24 hr tablet  Commonly known as:  IMDUR  Take 60 mg by mouth daily.     pantoprazole 20 MG tablet  Commonly known as:  PROTONIX  Take 20 mg by mouth daily.     QUEtiapine 100 MG tablet  Commonly known as:  SEROQUEL  Take 2 tablets (200 mg total) by mouth at bedtime.     traMADol 50 MG tablet  Commonly known as:  ULTRAM  Take 1 tablet (50 mg total) by mouth every 6 (six) hours as needed.     traMADol 50 MG tablet  Commonly known as:  ULTRAM  Take 1 tablet (50 mg total) by mouth every 6 (six) hours as needed. Maximum dose= 8 tablets per day        No orders of the defined types were placed in this encounter.    There is no immunization history for the selected administration types on file for this patient.  Social History  Substance Use Topics  . Smoking status: Current Every Day Smoker -- 0.50 packs/day    Types: Cigarettes  . Smokeless tobacco: Not on file  . Alcohol Use: No    Family history is - per pt, " I really can't say"  Review of Systems  DATA OBTAINED: from patient, nurse- no c/o or concerns GENERAL:  no fevers, fatigue, appetite changes SKIN: No itching, rash or wounds EYES: No eye pain, redness, discharge EARS: No earache, tinnitus, change in hearing NOSE: No congestion, drainage or bleeding  MOUTH/THROAT: No mouth or tooth pain,  No sore throat RESPIRATORY: No cough, wheezing, SOB CARDIAC: No chest pain, palpitations, lower extremity edema  GI: No abdominal pain, No N/V/D or constipation,  No heartburn or reflux  GU: No dysuria, frequency or urgency, or incontinence  MUSCULOSKELETAL: No unrelieved bone/joint pain NEUROLOGIC: No headache, dizziness or focal weakness PSYCHIATRIC: No c/o anxiety or sadness   Filed Vitals:   03/26/15 1230  BP: 134/66  Pulse: 80  Temp: 98.8 F (37.1 C)  Resp: 18    SpO2 Readings from Last 1 Encounters:  03/20/15 95%        Physical Exam  GENERAL APPEARANCE: Alert, conversant,  No acute distress.  SKIN: No diaphoresis rash HEAD: Normocephalic, atraumatic  EYES: Conjunctiva/lids clear. Pupils round, reactive. EOMs intact.  EARS: External exam WNL, canals clear. Hearing grossly normal.  NOSE: No deformity or discharge.  MOUTH/THROAT: Lips w/o lesions  RESPIRATORY: Breathing is even, unlabored. Lung sounds are clear   CARDIOVASCULAR: Heart RRR no murmurs, rubs or gallops. No peripheral edema.   GASTROINTESTINAL: Abdomen is soft, non-tender, not distended w/ normal bowel sounds. GENITOURINARY: Bladder non tender, not distended  MUSCULOSKELETAL: post op dressing and boot NEUROLOGIC:  Cranial nerves 2-12 grossly intact. Moves all extremities  PSYCHIATRIC: Mood and affect appropriate to situation, no behavioral issues  Patient Active Problem List   Diagnosis Date Noted  . Hyperlipidemia 03/26/2015  . Charcot ankle 03/16/2015  . Confusion 01/21/2015  . Acute on chronic combined systolic and diastolic heart failure ([place]) 01/21/2015  . Anemia 01/21/2015  . Multiple falls 01/21/2015  . Acute on chronic diastolic heart failure ([place]) 01/21/2015  . Anemia, chronic renal failure   . Ventricular tachycardia ([place])   . Chronic renal disease, stage 4, severely decreased glomerular filtration rate between 15-29 mL/min/1.73 square meter (HCC)   . Anemia in chronic kidney disease  12/09/2014  . CKD (chronic kidney disease) 12/09/2014  . Hypertensive heart/renal disease with failure ([place]) 12/09/2014  . Bipolar affective disorder ([place]) 12/09/2014  . Acute encephalopathy   . Acute delirium 11/18/2014  . Type 2 diabetes mellitus with hyperglycemia ([place]) 11/18/2014  . Essential hypertension 11/18/2014  . Chronic combined systolic and diastolic CHF (congestive heart failure) ([place]) 11/18/2014  . Fracture dislocation of ankle 11/17/2014  . DM type 2, uncontrolled, with renal complications ([place]) [id number]  . Hypokalemia 11/17/2014  . Essential hypertension, benign 11/17/2014  . Obesity 11/17/2014  . CAD (coronary artery disease) 11/17/2014    CBC    Component Value Date/Time   WBC 7.6 03/13/2015 0859   RBC 3.42* 03/13/2015 0859   RBC 3.36* 09/06/2007 0455   HGB 11.6* 03/16/2015 1039   HCT 34.0* 03/16/2015 1039   PLT 241 03/13/2015 0859   MCV 92.1 03/13/2015 0859   LYMPHSABS 1.3 01/20/2015 2140   MONOABS 0.7 01/20/2015 2140   EOSABS 0.1 01/20/2015 2140   BASOSABS 0.0 01/20/2015 2140    CMP     Component Value Date/Time   NA 140 03/16/2015 1039   K 4.7 03/16/2015 1039   CL 103 03/13/2015 0859   CO2 29 03/13/2015 0859   GLUCOSE 380* 03/16/2015 1039   BUN 49* 03/13/2015 0859   CREATININE 4.54* 03/13/2015 0859   CALCIUM 9.6 03/13/2015 0859   PROT 6.7 03/13/2015 0859   ALBUMIN 3.2* 03/13/2015 0859   AST 14* 03/13/2015 0859   ALT 12* 03/13/2015 0859   ALKPHOS 94 03/13/2015 0859   BILITOT 0.4 03/13/2015 0859   GFRNONAA 9* 03/13/2015 0859   GFRAA 11* 03/13/2015 0859    Lab Results  Component Value Date   HGBA1C 6.4* 01/21/2015     No results found.  Not all labs, radiology exams or  other studies done during hospitalization come through on my EPIC note; however they are reviewed by me.    Assessment and Plan  Charcot ankle S/p repair with union; SNF - OT/PT, ultram for pain and ASA 325 mg daily as prophylaxis  Hypertensive heart/renal  disease with failure SNF - chronic and stable; cont multiple agents-hydralazine, norvasc, bumex,coreg;   Chronic combined systolic and diastolic CHF (congestive heart failure) SNF - Stable on coreg and bumex; plan - cont  CAD (coronary artery disease) SNF -Stable; cont coreg, ASA 81 mg, imdur, statin   DM type 2, uncontrolled, with renal complications SNF - wel controlled, A1c 6.4, on levemir and novolog to continue  CKD (chronic kidney disease) SNF - will follow BMP, pt on bumex  Anemia, chronic renal failure SNF - d/c Hb 11.6; will follow CBC  Bipolar affective disorder SNF - stable- cont cogemtin, seroquel and prolixin  Hyperlipidemia SNF - chronic and stable; cont lipitor 40 mg daily   Time spent 45 min; > 50% of time with patient was spent reviewing records, labs, tests and studies, counseling and developing plan of car  Pt seen on 03/23/2015 Hennie Duos, MD

## 2015-03-26 NOTE — Assessment & Plan Note (Signed)
S/p repair with union; SNF - OT/PT, ultram for pain and ASA 325 mg daily as prophylaxis

## 2015-03-26 NOTE — Assessment & Plan Note (Signed)
SNF - stable- cont cogemtin, seroquel and prolixin

## 2015-03-26 NOTE — Assessment & Plan Note (Signed)
SNF -Stable; cont coreg, ASA 81 mg, imdur, statin

## 2015-03-26 NOTE — Assessment & Plan Note (Signed)
SNF - chronic and stable; cont multiple agents-hydralazine, norvasc, bumex,coreg;

## 2015-03-26 NOTE — Assessment & Plan Note (Signed)
SNF - wel controlled, A1c 6.4, on levemir and novolog to continue

## 2015-03-26 NOTE — Assessment & Plan Note (Signed)
SNF - Stable on coreg and bumex; plan - cont

## 2015-03-26 NOTE — Assessment & Plan Note (Signed)
SNF - chronic and stable; cont lipitor 40 mg daily

## 2015-03-26 NOTE — Assessment & Plan Note (Signed)
SNF - will follow BMP, pt on bumex

## 2015-03-26 NOTE — Progress Notes (Signed)
Patient ID: [name], female   DOB: 01/27/1951, 64 y.o.   MRN: [id number] MRN: [id number] Name: Robin [name]  Sex: female Age: 64 y.o. DOB: Oct 25, 1950  [place] [facility]: [facility] Facility/Room:108 Level Of Care: SNF Provider: Wille Celeste Emergency Contacts: Extended Emergency Contact Information Primary Emergency Contact: Pasqua,[name] Address: [address]          [place], [place] [address] [place] Phone: [phone] Relation: Daughter  Code Status:   Allergies: Oxycodone  Chief Complaint  Patient presents with  . Acute Visit   follow-up anemia of chronic kidney disease  HPI: Patient is 64 y.o. female with diabetic insensate neuropathy who is status post open reduction internal fixation left ankle fracture. Patient has had progressive destructive changes consistent with Charcot arthropathy and presents at this time for tibial calcaneal fusion. Pt was admitted to the hospital from 10/21-24 for above surgery and rehab. Pt is admitted to SNF for further OT/PT. While at SNF pt followed for bipplar dx, tx withcogentin,seroquel and prolixin, CHF, tx with bumex and coreg and HLD, tx with zocor  Updated labs show that her hemoglobin is now 6.9 it was 10.1 on October 18-this is most likely chronic disease from her renal issues.  Her creatinine has gone up slightly to 5.6 back on October 18 it was 4.54.  Clinically she appears to be doing well vital signs are stable she does not really have any complaints today.  Past Medical History  Diagnosis Date  . Coronary artery disease   . Bipolar disorder ([place])   . History of blood transfusion   . Benign hypertension   . Anemia   . Heart murmur   . CHF (congestive heart failure) ([place])   . Sleep apnea   . Shortness of breath dyspnea   . History of pneumonia   . History of bronchitis   . Depression   . Anxiety   . GERD (gastroesophageal reflux disease)   . Arthritis   . Diabetes mellitus without complication ([place])    . Type 2 diabetes mellitus ([place])   . Chronic kidney disease, stage V ([place])     Nephrologist is with [facility] (Dr. Tammi Klippel)    Past Surgical History  Procedure Laterality Date  . Cardiac catheterization      2 stent   . Av fistula placement Left may-2016    done at [facility]  . Tonsillectomy    . Abdominal hysterectomy    . Cholecystectomy    . Ankle closed reduction N/A 11/17/2014    Procedure: CLOSED REDUCTION ANKLE;  Surgeon: Earlie Server, MD;  Location: [place];  Service: Orthopedics;  Laterality: N/A;  . Orif ankle fracture Left 11/20/2014    Procedure: OPEN REDUCTION INTERNAL FIXATION (ORIF) ANKLE FRACTURE;  Surgeon: Renette Butters, MD;  Location: [facility];  Service: Orthopedics;  Laterality: Left;  . Coronary stent placement    . Hardware removal Left 03/16/2015    Procedure: Removal Hardware Left Ankle;  Surgeon: Newt Minion, MD;  Location: [facility];  Service: Orthopedics;  Laterality: Left;  . Ankle fusion Left 03/16/2015    Procedure: Left Tibiocalcaneal Fusion;  Surgeon: Newt Minion, MD;  Location: [facility];  Service: Orthopedics;  Laterality: Left;      Medication List       This list is accurate as of: 03/26/15 11:59 PM.  Always use your most recent med list.  albuterol 108 (90 BASE) MCG/ACT inhaler  Commonly known as:  PROVENTIL HFA;VENTOLIN HFA  Inhale 1-2 puffs into the lungs every 6 (six) hours as needed for wheezing or shortness of breath.     amLODipine 10 MG tablet  Commonly known as:  NORVASC  Take 10 mg by mouth daily.     aspirin EC 81 MG tablet  Take 81 mg by mouth daily.     aspirin EC 325 MG tablet  Take 1 tablet (325 mg total) by mouth daily.     atorvastatin 80 MG tablet  Commonly known as:  LIPITOR  Take 40 mg by mouth every evening.     benztropine 0.5 MG tablet  Commonly known as:  COGENTIN  Take 0.5 mg by mouth 2 (two) times daily.     bumetanide 2 MG tablet  Commonly known as:  BUMEX  Take 4 mg by mouth 2 (two)  times daily.     carvedilol 25 MG tablet  Commonly known as:  COREG  Take 25 mg by mouth 2 (two) times daily with a meal.     darbepoetin 40 MCG/0.4ML Soln injection  Commonly known as:  ARANESP  Inject 40 mcg into the skin every 14 (fourteen) days. 1st and the 15th of each month.     docusate sodium 100 MG capsule  Commonly known as:  COLACE  Take 1 capsule (100 mg total) by mouth 2 (two) times daily.     fluPHENAZine 1 MG tablet  Commonly known as:  PROLIXIN  Take 1 mg by mouth 2 (two) times daily.     gabapentin 300 MG capsule  Commonly known as:  NEURONTIN  Take 300 mg by mouth 2 (two) times daily.     hydrALAZINE 100 MG tablet  Commonly known as:  APRESOLINE  Take 100 mg by mouth 2 (two) times daily.     insulin aspart 100 UNIT/ML injection  Commonly known as:  novoLOG  Inject 7-15 Units into the skin 3 (three) times daily before meals. Sliding scale     insulin detemir 100 UNIT/ML injection  Commonly known as:  LEVEMIR  Inject 0.12 mLs (12 Units total) into the skin at bedtime.     isosorbide mononitrate 60 MG 24 hr tablet  Commonly known as:  IMDUR  Take 60 mg by mouth daily.     pantoprazole 20 MG tablet  Commonly known as:  PROTONIX  Take 20 mg by mouth daily.     QUEtiapine 100 MG tablet  Commonly known as:  SEROQUEL  Take 2 tablets (200 mg total) by mouth at bedtime.     traMADol 50 MG tablet  Commonly known as:  ULTRAM  Take 1 tablet (50 mg total) by mouth every 6 (six) hours as needed.     traMADol 50 MG tablet  Commonly known as:  ULTRAM  Take 1 tablet (50 mg total) by mouth every 6 (six) hours as needed. Maximum dose= 8 tablets per day        No orders of the defined types were placed in this encounter.    There is no immunization history for the selected administration types on file for this patient.  Social History  Substance Use Topics  . Smoking status: Current Every Day Smoker -- 0.50 packs/day    Types: Cigarettes  . Smokeless  tobacco: Not on file  . Alcohol Use: No    Family history is - per pt, " I really can't say"  Review of  Systems  DATA OBTAINED: from patient, nurse- no c/o or concerns GENERAL:  no fevers, fatigue, appetite changes SKIN: No itching, rash or wounds EYES: No eye pain, redness, discharge EARS: No earache, tinnitus, change in hearing NOSE: No congestion, drainage or bleeding  MOUTH/THROAT: No mouth or tooth pain, No sore throat RESPIRATORY: No cough, wheezing, SOB CARDIAC: No chest pain, palpitations  has mild left , lower extremity edema  GI: No abdominal pain, No N/V/D or constipation, No heartburn or reflux  GU: No dysuria, frequency or urgency, or incontinence  MUSCULOSKELETAL: No unrelieved bone/joint pain NEUROLOGIC: No headache, dizziness or focal weakness PSYCHIATRIC: No c/o anxiety or sadness   Filed Vitals:   03/26/15 2209  BP: 130/60  Pulse: 60  Temp: 97.3 F (36.3 C)  Resp: 18    SpO2 Readings from Last 1 Encounters:  03/20/15 95%        Physical Exam  GENERAL APPEARANCE: Alert, conversant,  No acute distress.  SKIN: No diaphoresis rash HEAD: Normocephalic, atraumatic  EYES: Conjunctiva/lids clear. Pupils round, reactive. EOMs intact.  EARS: External exam WNL, canals clear. Hearing grossly normal.  NOSE: No deformity or discharge.  MOUTH/THROAT: Lips w/o lesions  RESPIRATORY: Breathing is even, unlabored. Lung sounds are clear   CARDIOVASCULAR: Heart RRR no murmurs, rubs or gallops. No peripheral edema.   GASTROINTESTINAL: Abdomen is soft, non-tender, not distended w/ normal bowel sounds.  MUSCULOSKELETAL: post op dressing and boot--mild left lower extremity edema NEUROLOGIC:  Cranial nerves 2-12 grossly intact. Moves all extremities  PSYCHIATRIC: Mood and affect appropriate to situation, no behavioral issues  Patient Active Problem List   Diagnosis Date Noted  . Hyperlipidemia 03/26/2015  . Charcot ankle 03/16/2015  . Confusion 01/21/2015  . Acute  on chronic combined systolic and diastolic heart failure ([place]) 01/21/2015  . Anemia 01/21/2015  . Multiple falls 01/21/2015  . Acute on chronic diastolic heart failure ([place]) 01/21/2015  . Anemia, chronic renal failure   . Ventricular tachycardia ([place])   . Chronic renal disease, stage 4, severely decreased glomerular filtration rate between 15-29 mL/min/1.73 square meter (HCC)   . Anemia in chronic kidney disease 12/09/2014  . CKD (chronic kidney disease) 12/09/2014  . Hypertensive heart/renal disease with failure ([place]) 12/09/2014  . Bipolar affective disorder ([place]) 12/09/2014  . Acute encephalopathy   . Acute delirium 11/18/2014  . Type 2 diabetes mellitus with hyperglycemia ([place]) 11/18/2014  . Essential hypertension 11/18/2014  . Chronic combined systolic and diastolic CHF (congestive heart failure) ([place]) 11/18/2014  . Fracture dislocation of ankle 11/17/2014  . DM type 2, uncontrolled, with renal complications ([place]) [id number]  . Hypokalemia 11/17/2014  . Essential hypertension, benign 11/17/2014  . Obesity 11/17/2014  . CAD (coronary artery disease) 11/17/2014    Labs.  03/26/2015.  Sodium 138 potassium 3.4 BUN 81 creatinine 5.6.  WBC 9.6 hemoglobin 6.9 platelets 282  CBC    Component Value Date/Time   WBC 7.6 03/13/2015 0859   RBC 3.42* 03/13/2015 0859   RBC 3.36* 09/06/2007 0455   HGB 11.6* 03/16/2015 1039   HCT 34.0* 03/16/2015 1039   PLT 241 03/13/2015 0859   MCV 92.1 03/13/2015 0859   LYMPHSABS 1.3 01/20/2015 2140   MONOABS 0.7 01/20/2015 2140   EOSABS 0.1 01/20/2015 2140   BASOSABS 0.0 01/20/2015 2140    CMP     Component Value Date/Time   NA 140 03/16/2015 1039   K 4.7 03/16/2015 1039   CL 103 03/13/2015 0859   CO2 29 03/13/2015 0859  GLUCOSE 380* 03/16/2015 1039   BUN 49* 03/13/2015 0859   CREATININE 4.54* 03/13/2015 0859   CALCIUM 9.6 03/13/2015 0859   PROT 6.7 03/13/2015 0859   ALBUMIN 3.2* 03/13/2015 0859   AST 14* 03/13/2015 0859   ALT 12*  03/13/2015 0859   ALKPHOS 94 03/13/2015 0859   BILITOT 0.4 03/13/2015 0859   GFRNONAA 9* 03/13/2015 0859   GFRAA 11* 03/13/2015 0859    Lab Results  Component Value Date   HGBA1C 6.4* 01/21/2015       Assessment and Plan  Anemia of chronic renal disease-her hemoglobin has slipped a bit-clinically she appears to be stable does not complain of any increased fatigue or palpitations apparently doing well with therapy-she is followed by nephrology at the [name] interim-nursing has contacted them for follow-up here-she has received transfusion in the past-I suspect she would benefit from Aranesp will await their input.--She will need updated labs in about a week for follow-up  #2-history of chronic kidney disease her creatinine has gone up a bit as noted above now 5.6-was 4.54-her potassium also is minimally low at 3.4 will be hasn't been to supplement this aggressively secondary to her renal issues-at this point will await nephrology input.--.  [id number] note greater than 30 minutes spent assessing patient-discussing her status with nursing staff-reviewing her chart-and coordinating plan of care-of note greater than 50% of time spent coordinating care        Kemper Heupel C,

## 2015-03-31 ENCOUNTER — Non-Acute Institutional Stay (SKILLED_NURSING_FACILITY): Payer: Medicare Other | Admitting: Internal Medicine

## 2015-03-31 DIAGNOSIS — D631 Anemia in chronic kidney disease: Secondary | ICD-10-CM

## 2015-03-31 DIAGNOSIS — N189 Chronic kidney disease, unspecified: Secondary | ICD-10-CM | POA: Diagnosis not present

## 2015-03-31 DIAGNOSIS — N184 Chronic kidney disease, stage 4 (severe): Secondary | ICD-10-CM | POA: Diagnosis not present

## 2015-03-31 NOTE — Progress Notes (Signed)
[name], female   DOB: 03-25-1951, 64 y.o.   MRN: [id number]  MRN: [id number] Name: Robin [name]  Sex: female Age: 64 y.o. DOB: 1951-03-23  [place] [facility] Facility/Room:108 Level Of Care: SNF Provider: Wille Celeste Emergency Contacts: Extended Emergency Contact Information Primary Emergency Contact: Countess,[name] Address: [address]          [place], [place] [address] [place] Phone: [phone] Relation: Daughter  Code Status:   Allergies: Oxycodone  Chief Complaint  Patient presents with  . Acute Visit   follow-up anemia of chronic kidney disease  HPI: Patient is 64 y.o. female with diabetic insensate neuropathy who is status post open reduction internal fixation left ankle fracture. Patient has had progressive destructive changes consistent with Charcot arthropathy and presented for tibial calcaneal fusion. Pt was admitted to the hospital from 10/21-24 for above surgery and rehab. Pt was admitted to SNF for further OT/PT. While at SNF pt followed for bipplar dx, tx withcogentin,seroquel and prolixin, CHF, tx with bumex and coreg and HLD, tx with zocor She does have a history of stage IV chronic renal failure with baseline creatinine in the 4-5 area it appears it was most recently 5.6 on lab done on October 31.  Her lab at that time also showed a hemoglobin of 6.9-we did order an updated lab today stat and this has come back improved at 7.4.  Clinically she appears to be stable she is followed by nephrology  At the [facility] in [facility]-- and has no acute complaints today     .  Past Medical History  Diagnosis Date  . Coronary artery disease   . Bipolar disorder ([place])   . History of blood transfusion   . Benign hypertension   . Anemia   . Heart murmur   . CHF (congestive heart failure) ([place])   . Sleep apnea   . Shortness of breath dyspnea   . History of pneumonia   . History of bronchitis   . Depression   . Anxiety   . GERD  (gastroesophageal reflux disease)   . Arthritis   . Diabetes mellitus without complication ([place])   . Type 2 diabetes mellitus ([place])   . Chronic kidney disease, stage V ([place])     Nephrologist is with [facility] (Dr. Tammi Klippel)    Past Surgical History  Procedure Laterality Date  . Cardiac catheterization      2 stent   . Av fistula placement Left may-2016    done at [name]  . Tonsillectomy    . Abdominal hysterectomy    . Cholecystectomy    . Ankle closed reduction N/A 11/17/2014    Procedure: CLOSED REDUCTION ANKLE;  Surgeon: Earlie Server, MD;  Location: [facility];  Service: Orthopedics;  Laterality: N/A;  . Orif ankle fracture Left 11/20/2014    Procedure: OPEN REDUCTION INTERNAL FIXATION (ORIF) ANKLE FRACTURE;  Surgeon: Renette Butters, MD;  Location: [facility];  Service: Orthopedics;  Laterality: Left;  . Coronary stent placement    . Hardware removal Left 03/16/2015    Procedure: Removal Hardware Left Ankle;  Surgeon: Newt Minion, MD;  Location: [facility];  Service: Orthopedics;  Laterality: Left;  . Ankle fusion Left 03/16/2015    Procedure: Left Tibiocalcaneal Fusion;  Surgeon: Newt Minion, MD;  Location: [employer];  Service: Orthopedics;  Laterality: Left;      Medication List       This list is accurate  as of: 03/31/15 11:59 PM.  Always use your most recent med list.               albuterol 108 (90 BASE) MCG/ACT inhaler  Commonly known as:  PROVENTIL HFA;VENTOLIN HFA  Inhale 1-2 puffs into the lungs every 6 (six) hours as needed for wheezing or shortness of breath.     amLODipine 10 MG tablet  Commonly known as:  NORVASC  Take 10 mg by mouth daily.     aspirin EC 81 MG tablet  Take 81 mg by mouth daily.     aspirin EC 325 MG tablet  Take 1 tablet (325 mg total) by mouth daily.     atorvastatin 80 MG tablet  Commonly known as:  LIPITOR  Take 40 mg by mouth every evening.     benztropine 0.5 MG tablet  Commonly known as:  COGENTIN  Take 0.5 mg by mouth 2 (two)  times daily.     bumetanide 2 MG tablet  Commonly known as:  BUMEX  Take 4 mg by mouth 2 (two) times daily.     carvedilol 25 MG tablet  Commonly known as:  COREG  Take 25 mg by mouth 2 (two) times daily with a meal.     darbepoetin 40 MCG/0.4ML Soln injection  Commonly known as:  ARANESP  Inject 40 mcg into the skin every 14 (fourteen) days. 1st and the 15th of each month.     docusate sodium 100 MG capsule  Commonly known as:  COLACE  Take 1 capsule (100 mg total) by mouth 2 (two) times daily.     fluPHENAZine 1 MG tablet  Commonly known as:  PROLIXIN  Take 1 mg by mouth 2 (two) times daily.     gabapentin 300 MG capsule  Commonly known as:  NEURONTIN  Take 300 mg by mouth 2 (two) times daily.     hydrALAZINE 100 MG tablet  Commonly known as:  APRESOLINE  Take 100 mg by mouth 2 (two) times daily.     insulin aspart 100 UNIT/ML injection  Commonly known as:  novoLOG  Inject 7-15 Units into the skin 3 (three) times daily before meals. Sliding scale     insulin detemir 100 UNIT/ML injection  Commonly known as:  LEVEMIR  Inject 0.12 mLs (12 Units total) into the skin at bedtime.     isosorbide mononitrate 60 MG 24 hr tablet  Commonly known as:  IMDUR  Take 60 mg by mouth daily.     pantoprazole 20 MG tablet  Commonly known as:  PROTONIX  Take 20 mg by mouth daily.     QUEtiapine 100 MG tablet  Commonly known as:  SEROQUEL  Take 2 tablets (200 mg total) by mouth at bedtime.     traMADol 50 MG tablet  Commonly known as:  ULTRAM  Take 1 tablet (50 mg total) by mouth every 6 (six) hours as needed.     traMADol 50 MG tablet  Commonly known as:  ULTRAM  Take 1 tablet (50 mg total) by mouth every 6 (six) hours as needed. Maximum dose= 8 tablets per day            Social History  Substance Use Topics  . Smoking status: Current Every Day Smoker -- 0.50 packs/day    Types: Cigarettes  . Smokeless tobacco: Not on file  . Alcohol Use: No    Family history is  - per pt, " I really can't say"  Review  of Systems  DATA OBTAINED: from patient, nurse- no c/o or concerns GENERAL:  no fevers, fatigue, appetite changes SKIN: No itching, rash or wounds EYES: No eye pain, redness, discharge EARS: No earache, tinnitus, change in hearing NOSE: No congestion, drainage or bleeding  MOUTH/THROAT: No mouth or tooth pain, No sore throat RESPIRATORY: No cough, wheezing, SOB CARDIAC: No chest pain, palpitations  has mild left , lower extremity edema  GI: No abdominal pain, No N/V/D or constipation, No heartburn or reflux  GU: No dysuria, frequency or urgency, or incontinence  MUSCULOSKELETAL: No unrelieved bone/joint pain NEUROLOGIC: No headache, or focal weakness--- PSYCHIATRIC: No c/o anxiety or sadness   Filed Vitals:   04/09/15 0933  BP: 126/71  Pulse: 73  Resp: 18    SpO2 Readings from Last 1 Encounters:  03/20/15 95%        Physical Exam  GENERAL APPEARANCE: Alert, conversant,  No acute distress.  SKIN: No diaphoresis rash HEAD: Normocephalic, atraumatic  EYES: Conjunctiva/lids clear. Pupils round, reactive. EOMs intact.  EARS: External exam WNL, canals clear. Hearing grossly normal.  NOSE: No deformity or discharge. RESPIRATORY: Breathing is even, unlabored. Lung sounds are clear   CARDIOVASCULAR: Heart RRR no murmurs, rubs or gallops. No peripheral edema.   GASTROINTESTINAL: Abdomen is soft, non-tender, not distended w/ normal bowel sounds.  MUSCULOSKELETAL: post op dressing and boot--mild left lower extremity edema NEUROLOGIC:  Cranial nerves 2-12 grossly intact. Moves all extremities  PSYCHIATRIC: Mood and affect appropriate to situation, no behavioral issues  Patient Active Problem List   Diagnosis Date Noted  . Hyperlipidemia 03/26/2015  . Charcot ankle 03/16/2015  . Confusion 01/21/2015  . Acute on chronic combined systolic and diastolic heart failure ([place]) 01/21/2015  . Anemia 01/21/2015  . Multiple falls 01/21/2015  .  Acute on chronic diastolic heart failure ([place]) 01/21/2015  . Anemia, chronic renal failure   . Ventricular tachycardia ([place])   . Chronic renal disease, stage 4, severely decreased glomerular filtration rate between 15-29 mL/min/1.73 square meter (HCC)   . Anemia in chronic kidney disease 12/09/2014  . CKD (chronic kidney disease) 12/09/2014  . Hypertensive heart/renal disease with failure ([place]) 12/09/2014  . Bipolar affective disorder ([place]) 12/09/2014  . Acute encephalopathy   . Acute delirium 11/18/2014  . Type 2 diabetes mellitus with hyperglycemia ([place]) 11/18/2014  . Essential hypertension 11/18/2014  . Chronic combined systolic and diastolic CHF (congestive heart failure) ([place]) 11/18/2014  . Fracture dislocation of ankle 11/17/2014  . DM type 2, uncontrolled, with renal complications ([place]) [id number]  . Hypokalemia 11/17/2014  . Essential hypertension, benign 11/17/2014  . Obesity 11/17/2014  . CAD (coronary artery disease) 11/17/2014    Labs.  03/31/2015.  Of note hemoglobin on lab now 7.4  03/26/2015.  Sodium 138 potassium 3.4 BUN 81 creatinine 5.6.  WBC 9.6 hemoglobin 6.9 platelets 282  CBC    Component Value Date/Time   WBC 7.6 03/13/2015 0859   RBC 3.42* 03/13/2015 0859   RBC 3.36* 09/06/2007 0455   HGB 11.6* 03/16/2015 1039   HCT 34.0* 03/16/2015 1039   PLT 241 03/13/2015 0859   MCV 92.1 03/13/2015 0859   LYMPHSABS 1.3 01/20/2015 2140   MONOABS 0.7 01/20/2015 2140   EOSABS 0.1 01/20/2015 2140   BASOSABS 0.0 01/20/2015 2140    CMP     Component Value Date/Time   NA 140 03/16/2015 1039   K 4.7 03/16/2015 1039   CL 103 03/13/2015 0859   CO2 29 03/13/2015 0859   GLUCOSE 380*  03/16/2015 1039   BUN 49* 03/13/2015 0859   CREATININE 4.54* 03/13/2015 0859   CALCIUM 9.6 03/13/2015 0859   PROT 6.7 03/13/2015 0859   ALBUMIN 3.2* 03/13/2015 0859   AST 14* 03/13/2015 0859   ALT 12* 03/13/2015 0859   ALKPHOS 94 03/13/2015 0859   BILITOT 0.4 03/13/2015 0859    GFRNONAA 9* 03/13/2015 0859   GFRAA 11* 03/13/2015 0859    Lab Results  Component Value Date   HGBA1C 6.4* 01/21/2015       Assessment and Plan  Anemia of chronic renal disease-this appears to have stabilized in the low sevens-will update this again next week-I suspect she would be a candidate for Aranesp will await nephrology input on this clinically she appears to be doing well   -history of chronic kidney disease her creatinine has gone up a bit as noted above now 5.6-was 4.54-her potassium also is minimally low at 3.4--will not supplement potassium secondary to her renal disease and potassium is minimally low-this will need updating tomorrow to see where we stand-nephrology to follow this as well   [id number]         Jameil Whitmoyer C,

## 2015-04-06 ENCOUNTER — Non-Acute Institutional Stay (SKILLED_NURSING_FACILITY): Payer: Medicare Other | Admitting: Internal Medicine

## 2015-04-06 ENCOUNTER — Encounter: Payer: Self-pay | Admitting: Internal Medicine

## 2015-04-06 DIAGNOSIS — F3170 Bipolar disorder, currently in remission, most recent episode unspecified: Secondary | ICD-10-CM

## 2015-04-06 DIAGNOSIS — E1165 Type 2 diabetes mellitus with hyperglycemia: Secondary | ICD-10-CM

## 2015-04-06 DIAGNOSIS — M14672 Charcot's joint, left ankle and foot: Secondary | ICD-10-CM | POA: Diagnosis not present

## 2015-04-06 DIAGNOSIS — I5042 Chronic combined systolic (congestive) and diastolic (congestive) heart failure: Secondary | ICD-10-CM | POA: Diagnosis not present

## 2015-04-06 DIAGNOSIS — E785 Hyperlipidemia, unspecified: Secondary | ICD-10-CM

## 2015-04-06 DIAGNOSIS — N189 Chronic kidney disease, unspecified: Secondary | ICD-10-CM

## 2015-04-06 DIAGNOSIS — E1122 Type 2 diabetes mellitus with diabetic chronic kidney disease: Secondary | ICD-10-CM | POA: Diagnosis not present

## 2015-04-06 DIAGNOSIS — I130 Hypertensive heart and chronic kidney disease with heart failure and stage 1 through stage 4 chronic kidney disease, or unspecified chronic kidney disease: Secondary | ICD-10-CM | POA: Diagnosis not present

## 2015-04-06 DIAGNOSIS — N184 Chronic kidney disease, stage 4 (severe): Secondary | ICD-10-CM | POA: Diagnosis not present

## 2015-04-06 DIAGNOSIS — I509 Heart failure, unspecified: Secondary | ICD-10-CM

## 2015-04-06 DIAGNOSIS — N183 Chronic kidney disease, stage 3 (moderate): Secondary | ICD-10-CM | POA: Diagnosis not present

## 2015-04-06 DIAGNOSIS — Z794 Long term (current) use of insulin: Secondary | ICD-10-CM

## 2015-04-06 DIAGNOSIS — D631 Anemia in chronic kidney disease: Secondary | ICD-10-CM

## 2015-04-06 DIAGNOSIS — IMO0001 Reserved for inherently not codable concepts without codable children: Secondary | ICD-10-CM

## 2015-04-06 DIAGNOSIS — I2510 Atherosclerotic heart disease of native coronary artery without angina pectoris: Secondary | ICD-10-CM

## 2015-04-06 DIAGNOSIS — IMO0002 Reserved for concepts with insufficient information to code with codable children: Secondary | ICD-10-CM

## 2015-04-06 NOTE — Progress Notes (Addendum)
MRN: [id number] Name: Robin [name]  Sex: female Age: 64 y.o. DOB: 17-Oct-1950  [place] #: [facility] Facility/Room:108 Level Of Care: SNF Provider: Inocencio Homes D Emergency Contacts: Extended Emergency Contact Information Primary Emergency Contact: Feehan,[name] Address: [address]          [place], [place] [address] [place] Phone: [phone] Relation: Daughter  Code Status: FULL  Allergies: Oxycodone  Chief Complaint  Patient presents with  . Discharge Note    HPI: Patient is 64 y.o. female  With diabetic insensate neuropathy who is status post open reduction internal fixation left ankle fracture. Patient has had progressive destructive changes consistent with Charcot arthropathy and presents at this time for tibial calcaneal fusion. Pt was admitted to the hospital from 10/21-24 for above surgery and rehab. Pt was admitted to SNF for further OT/PT.Pt is now ready to be d/c to home. Of note  pt's seroquel for psychosis was increased from 200 mg qHS to 400 mg qHS.   Past Medical History  Diagnosis Date  . Coronary artery disease   . Bipolar disorder ([place])   . History of blood transfusion   . Benign hypertension   . Anemia   . Heart murmur   . CHF (congestive heart failure) ([place])   . Sleep apnea   . Shortness of breath dyspnea   . History of pneumonia   . History of bronchitis   . Depression   . Anxiety   . GERD (gastroesophageal reflux disease)   . Arthritis   . Diabetes mellitus without complication ([place])   . Type 2 diabetes mellitus ([place])   . Chronic kidney disease, stage V ([place])     Nephrologist is with [facility] (Dr. Tammi Klippel)    Past Surgical History  Procedure Laterality Date  . Cardiac catheterization      2 stent   . Av fistula placement Left may-2016    done at [facility]  . Tonsillectomy    . Abdominal hysterectomy    . Cholecystectomy    . Ankle closed reduction N/A 11/17/2014    Procedure: CLOSED REDUCTION ANKLE;  Surgeon:  Earlie Server, MD;  Location: [facility];  Service: Orthopedics;  Laterality: N/A;  . Orif ankle fracture Left 11/20/2014    Procedure: OPEN REDUCTION INTERNAL FIXATION (ORIF) ANKLE FRACTURE;  Surgeon: Renette Butters, MD;  Location: [facility];  Service: Orthopedics;  Laterality: Left;  . Coronary stent placement    . Hardware removal Left 03/16/2015    Procedure: Removal Hardware Left Ankle;  Surgeon: Newt Minion, MD;  Location: [facility];  Service: Orthopedics;  Laterality: Left;  . Ankle fusion Left 03/16/2015    Procedure: Left Tibiocalcaneal Fusion;  Surgeon: Newt Minion, MD;  Location: [facility];  Service: Orthopedics;  Laterality: Left;      Medication List       This list is accurate as of: 04/06/15  4:44 PM.  Always use your most recent med list.               albuterol 108 (90 BASE) MCG/ACT inhaler  Commonly known as:  PROVENTIL HFA;VENTOLIN HFA  Inhale 1-2 puffs into the lungs every 6 (six) hours as needed for wheezing or shortness of breath.     amLODipine 10 MG tablet  Commonly known as:  NORVASC  Take 10 mg by mouth daily.     aspirin EC 81 MG tablet  Take 81 mg by mouth daily.     aspirin  EC 325 MG tablet  Take 1 tablet (325 mg total) by mouth daily.     atorvastatin 80 MG tablet  Commonly known as:  LIPITOR  Take 40 mg by mouth every evening.     benztropine 0.5 MG tablet  Commonly known as:  COGENTIN  Take 0.5 mg by mouth 2 (two) times daily.     bumetanide 2 MG tablet  Commonly known as:  BUMEX  Take 4 mg by mouth 2 (two) times daily.     carvedilol 25 MG tablet  Commonly known as:  COREG  Take 25 mg by mouth 2 (two) times daily with a meal.     darbepoetin 40 MCG/0.4ML Soln injection  Commonly known as:  ARANESP  Inject 40 mcg into the skin every 14 (fourteen) days. 1st and the 15th of each month.     docusate sodium 100 MG capsule  Commonly known as:  COLACE  Take 1 capsule (100 mg total) by mouth 2 (two) times daily.     fluPHENAZine 1 MG tablet   Commonly known as:  PROLIXIN  Take 1 mg by mouth 2 (two) times daily.     gabapentin 300 MG capsule  Commonly known as:  NEURONTIN  Take 300 mg by mouth 2 (two) times daily.     hydrALAZINE 100 MG tablet  Commonly known as:  APRESOLINE  Take 100 mg by mouth 2 (two) times daily.     insulin aspart 100 UNIT/ML injection  Commonly known as:  novoLOG  Inject 7-15 Units into the skin 3 (three) times daily before meals. Sliding scale     insulin detemir 100 UNIT/ML injection  Commonly known as:  LEVEMIR  Inject 0.12 mLs (12 Units total) into the skin at bedtime.     isosorbide mononitrate 60 MG 24 hr tablet  Commonly known as:  IMDUR  Take 60 mg by mouth daily.     pantoprazole 20 MG tablet  Commonly known as:  PROTONIX  Take 20 mg by mouth daily.     QUEtiapine 100 MG tablet  Commonly known as:  SEROQUEL  Take 2 tablets (200 mg total) by mouth at bedtime.     traMADol 50 MG tablet  Commonly known as:  ULTRAM  Take 1 tablet (50 mg total) by mouth every 6 (six) hours as needed.     traMADol 50 MG tablet  Commonly known as:  ULTRAM  Take 1 tablet (50 mg total) by mouth every 6 (six) hours as needed. Maximum dose= 8 tablets per day        No orders of the defined types were placed in this encounter.    There is no immunization history for the selected administration types on file for this patient.  Social History  Substance Use Topics  . Smoking status: Current Every Day Smoker -- 0.50 packs/day    Types: Cigarettes  . Smokeless tobacco: Not on file  . Alcohol Use: No    Filed Vitals:   04/06/15 1640  BP: 137/67  Pulse: 70  Temp: 98.3 F (36.8 C)  Resp: 18    Physical Exam  GENERAL APPEARANCE: Alert, conversant. No acute distress.  HEENT: Unremarkable. RESPIRATORY: Breathing is even, unlabored. Lung sounds are clear   CARDIOVASCULAR: Heart RRR no murmurs, rubs or gallops. No peripheral edema.  GASTROINTESTINAL: Abdomen is soft, non-tender, not distended  w/ normal bowel sounds.  NEUROLOGIC: Cranial nerves 2-12 grossly intact. Moves all extremities  Patient Active Problem List   Diagnosis  Date Noted  . Hyperlipidemia 03/26/2015  . Charcot ankle 03/16/2015  . Confusion 01/21/2015  . Acute on chronic combined systolic and diastolic heart failure ([place]) 01/21/2015  . Anemia 01/21/2015  . Multiple falls 01/21/2015  . Acute on chronic diastolic heart failure ([place]) 01/21/2015  . Anemia, chronic renal failure   . Ventricular tachycardia ([place])   . Chronic renal disease, stage 4, severely decreased glomerular filtration rate between 15-29 mL/min/1.73 square meter (HCC)   . Anemia in chronic kidney disease 12/09/2014  . CKD (chronic kidney disease) 12/09/2014  . Hypertensive heart/renal disease with failure ([place]) 12/09/2014  . Bipolar affective disorder ([place]) 12/09/2014  . Acute encephalopathy   . Acute delirium 11/18/2014  . Type 2 diabetes mellitus with hyperglycemia ([place]) 11/18/2014  . Essential hypertension 11/18/2014  . Chronic combined systolic and diastolic CHF (congestive heart failure) ([place]) 11/18/2014  . Fracture dislocation of ankle 11/17/2014  . DM type 2, uncontrolled, with renal complications ([place]) [id number]  . Hypokalemia 11/17/2014  . Essential hypertension, benign 11/17/2014  . Obesity 11/17/2014  . CAD (coronary artery disease) 11/17/2014    CBC    Component Value Date/Time   WBC 7.6 03/13/2015 0859   RBC 3.42* 03/13/2015 0859   RBC 3.36* 09/06/2007 0455   HGB 11.6* 03/16/2015 1039   HCT 34.0* 03/16/2015 1039   PLT 241 03/13/2015 0859   MCV 92.1 03/13/2015 0859   LYMPHSABS 1.3 01/20/2015 2140   MONOABS 0.7 01/20/2015 2140   EOSABS 0.1 01/20/2015 2140   BASOSABS 0.0 01/20/2015 2140    CMP     Component Value Date/Time   NA 140 03/16/2015 1039   K 4.7 03/16/2015 1039   CL 103 03/13/2015 0859   CO2 29 03/13/2015 0859   GLUCOSE 380* 03/16/2015 1039   BUN 49* 03/13/2015 0859   CREATININE 4.54* 03/13/2015  0859   CALCIUM 9.6 03/13/2015 0859   PROT 6.7 03/13/2015 0859   ALBUMIN 3.2* 03/13/2015 0859   AST 14* 03/13/2015 0859   ALT 12* 03/13/2015 0859   ALKPHOS 94 03/13/2015 0859   BILITOT 0.4 03/13/2015 0859   GFRNONAA 9* 03/13/2015 0859   GFRAA 11* 03/13/2015 0859    Assessment and Plan  Pt is d/c to home with family with HH/OT/PT/nursing. Rx's have been written. Noted late on today, Friday, Hb 7.0. This is unchanged from Hb of 6.9 on 10/31. This will need to be followed up during the week of discharge. Pt has been without symptoms.   Time spent 35 min;> 50% of time with patient was spent reviewing records, labs, tests and studies, counseling and developing plan of care  Hennie Duos, MD

## 2015-04-09 ENCOUNTER — Encounter: Payer: Self-pay | Admitting: Internal Medicine

## 2015-04-11 DIAGNOSIS — E1140 Type 2 diabetes mellitus with diabetic neuropathy, unspecified: Secondary | ICD-10-CM | POA: Diagnosis not present

## 2015-04-11 DIAGNOSIS — M6281 Muscle weakness (generalized): Secondary | ICD-10-CM | POA: Diagnosis not present

## 2015-04-11 DIAGNOSIS — F329 Major depressive disorder, single episode, unspecified: Secondary | ICD-10-CM | POA: Diagnosis not present

## 2015-04-11 DIAGNOSIS — I5032 Chronic diastolic (congestive) heart failure: Secondary | ICD-10-CM | POA: Diagnosis not present

## 2015-04-11 DIAGNOSIS — F419 Anxiety disorder, unspecified: Secondary | ICD-10-CM | POA: Diagnosis not present

## 2015-04-11 DIAGNOSIS — E1122 Type 2 diabetes mellitus with diabetic chronic kidney disease: Secondary | ICD-10-CM | POA: Diagnosis not present

## 2015-04-11 DIAGNOSIS — D631 Anemia in chronic kidney disease: Secondary | ICD-10-CM | POA: Diagnosis not present

## 2015-04-11 DIAGNOSIS — Z4789 Encounter for other orthopedic aftercare: Secondary | ICD-10-CM | POA: Diagnosis not present

## 2015-04-11 DIAGNOSIS — E11610 Type 2 diabetes mellitus with diabetic neuropathic arthropathy: Secondary | ICD-10-CM | POA: Diagnosis not present

## 2015-04-11 DIAGNOSIS — Z9181 History of falling: Secondary | ICD-10-CM | POA: Diagnosis not present

## 2015-04-11 DIAGNOSIS — N185 Chronic kidney disease, stage 5: Secondary | ICD-10-CM | POA: Diagnosis not present

## 2015-04-11 DIAGNOSIS — I132 Hypertensive heart and chronic kidney disease with heart failure and with stage 5 chronic kidney disease, or end stage renal disease: Secondary | ICD-10-CM | POA: Diagnosis not present

## 2015-04-12 DIAGNOSIS — I5032 Chronic diastolic (congestive) heart failure: Secondary | ICD-10-CM | POA: Diagnosis not present

## 2015-04-12 DIAGNOSIS — E1122 Type 2 diabetes mellitus with diabetic chronic kidney disease: Secondary | ICD-10-CM | POA: Diagnosis not present

## 2015-04-12 DIAGNOSIS — E11610 Type 2 diabetes mellitus with diabetic neuropathic arthropathy: Secondary | ICD-10-CM | POA: Diagnosis not present

## 2015-04-12 DIAGNOSIS — I132 Hypertensive heart and chronic kidney disease with heart failure and with stage 5 chronic kidney disease, or end stage renal disease: Secondary | ICD-10-CM | POA: Diagnosis not present

## 2015-04-12 DIAGNOSIS — N185 Chronic kidney disease, stage 5: Secondary | ICD-10-CM | POA: Diagnosis not present

## 2015-04-12 DIAGNOSIS — Z4789 Encounter for other orthopedic aftercare: Secondary | ICD-10-CM | POA: Diagnosis not present

## 2015-04-18 DIAGNOSIS — N185 Chronic kidney disease, stage 5: Secondary | ICD-10-CM | POA: Diagnosis not present

## 2015-04-18 DIAGNOSIS — I132 Hypertensive heart and chronic kidney disease with heart failure and with stage 5 chronic kidney disease, or end stage renal disease: Secondary | ICD-10-CM | POA: Diagnosis not present

## 2015-04-18 DIAGNOSIS — I5032 Chronic diastolic (congestive) heart failure: Secondary | ICD-10-CM | POA: Diagnosis not present

## 2015-04-18 DIAGNOSIS — Z4789 Encounter for other orthopedic aftercare: Secondary | ICD-10-CM | POA: Diagnosis not present

## 2015-04-18 DIAGNOSIS — E11610 Type 2 diabetes mellitus with diabetic neuropathic arthropathy: Secondary | ICD-10-CM | POA: Diagnosis not present

## 2015-04-18 DIAGNOSIS — E1122 Type 2 diabetes mellitus with diabetic chronic kidney disease: Secondary | ICD-10-CM | POA: Diagnosis not present

## 2015-04-20 DIAGNOSIS — N185 Chronic kidney disease, stage 5: Secondary | ICD-10-CM | POA: Diagnosis not present

## 2015-04-20 DIAGNOSIS — I132 Hypertensive heart and chronic kidney disease with heart failure and with stage 5 chronic kidney disease, or end stage renal disease: Secondary | ICD-10-CM | POA: Diagnosis not present

## 2015-04-20 DIAGNOSIS — E11610 Type 2 diabetes mellitus with diabetic neuropathic arthropathy: Secondary | ICD-10-CM | POA: Diagnosis not present

## 2015-04-20 DIAGNOSIS — Z4789 Encounter for other orthopedic aftercare: Secondary | ICD-10-CM | POA: Diagnosis not present

## 2015-04-20 DIAGNOSIS — E1122 Type 2 diabetes mellitus with diabetic chronic kidney disease: Secondary | ICD-10-CM | POA: Diagnosis not present

## 2015-04-20 DIAGNOSIS — I5032 Chronic diastolic (congestive) heart failure: Secondary | ICD-10-CM | POA: Diagnosis not present

## 2015-04-23 DIAGNOSIS — I5032 Chronic diastolic (congestive) heart failure: Secondary | ICD-10-CM | POA: Diagnosis not present

## 2015-04-23 DIAGNOSIS — N185 Chronic kidney disease, stage 5: Secondary | ICD-10-CM | POA: Diagnosis not present

## 2015-04-23 DIAGNOSIS — I132 Hypertensive heart and chronic kidney disease with heart failure and with stage 5 chronic kidney disease, or end stage renal disease: Secondary | ICD-10-CM | POA: Diagnosis not present

## 2015-04-23 DIAGNOSIS — E1122 Type 2 diabetes mellitus with diabetic chronic kidney disease: Secondary | ICD-10-CM | POA: Diagnosis not present

## 2015-04-23 DIAGNOSIS — Z4789 Encounter for other orthopedic aftercare: Secondary | ICD-10-CM | POA: Diagnosis not present

## 2015-04-23 DIAGNOSIS — E11610 Type 2 diabetes mellitus with diabetic neuropathic arthropathy: Secondary | ICD-10-CM | POA: Diagnosis not present

## 2015-04-26 DIAGNOSIS — Z4789 Encounter for other orthopedic aftercare: Secondary | ICD-10-CM | POA: Diagnosis not present

## 2015-04-26 DIAGNOSIS — E1122 Type 2 diabetes mellitus with diabetic chronic kidney disease: Secondary | ICD-10-CM | POA: Diagnosis not present

## 2015-04-26 DIAGNOSIS — I5032 Chronic diastolic (congestive) heart failure: Secondary | ICD-10-CM | POA: Diagnosis not present

## 2015-04-26 DIAGNOSIS — N185 Chronic kidney disease, stage 5: Secondary | ICD-10-CM | POA: Diagnosis not present

## 2015-04-26 DIAGNOSIS — I132 Hypertensive heart and chronic kidney disease with heart failure and with stage 5 chronic kidney disease, or end stage renal disease: Secondary | ICD-10-CM | POA: Diagnosis not present

## 2015-04-26 DIAGNOSIS — E11610 Type 2 diabetes mellitus with diabetic neuropathic arthropathy: Secondary | ICD-10-CM | POA: Diagnosis not present

## 2015-04-27 DIAGNOSIS — N185 Chronic kidney disease, stage 5: Secondary | ICD-10-CM | POA: Diagnosis not present

## 2015-04-27 DIAGNOSIS — E1122 Type 2 diabetes mellitus with diabetic chronic kidney disease: Secondary | ICD-10-CM | POA: Diagnosis not present

## 2015-04-27 DIAGNOSIS — E11610 Type 2 diabetes mellitus with diabetic neuropathic arthropathy: Secondary | ICD-10-CM | POA: Diagnosis not present

## 2015-04-27 DIAGNOSIS — I132 Hypertensive heart and chronic kidney disease with heart failure and with stage 5 chronic kidney disease, or end stage renal disease: Secondary | ICD-10-CM | POA: Diagnosis not present

## 2015-04-27 DIAGNOSIS — I5032 Chronic diastolic (congestive) heart failure: Secondary | ICD-10-CM | POA: Diagnosis not present

## 2015-04-27 DIAGNOSIS — Z4789 Encounter for other orthopedic aftercare: Secondary | ICD-10-CM | POA: Diagnosis not present

## 2015-04-30 DIAGNOSIS — Z4789 Encounter for other orthopedic aftercare: Secondary | ICD-10-CM | POA: Diagnosis not present

## 2015-04-30 DIAGNOSIS — E1122 Type 2 diabetes mellitus with diabetic chronic kidney disease: Secondary | ICD-10-CM | POA: Diagnosis not present

## 2015-04-30 DIAGNOSIS — I132 Hypertensive heart and chronic kidney disease with heart failure and with stage 5 chronic kidney disease, or end stage renal disease: Secondary | ICD-10-CM | POA: Diagnosis not present

## 2015-04-30 DIAGNOSIS — E11610 Type 2 diabetes mellitus with diabetic neuropathic arthropathy: Secondary | ICD-10-CM | POA: Diagnosis not present

## 2015-04-30 DIAGNOSIS — N185 Chronic kidney disease, stage 5: Secondary | ICD-10-CM | POA: Diagnosis not present

## 2015-04-30 DIAGNOSIS — I5032 Chronic diastolic (congestive) heart failure: Secondary | ICD-10-CM | POA: Diagnosis not present

## 2015-05-02 DIAGNOSIS — Z4789 Encounter for other orthopedic aftercare: Secondary | ICD-10-CM | POA: Diagnosis not present

## 2015-05-02 DIAGNOSIS — N185 Chronic kidney disease, stage 5: Secondary | ICD-10-CM | POA: Diagnosis not present

## 2015-05-02 DIAGNOSIS — I5032 Chronic diastolic (congestive) heart failure: Secondary | ICD-10-CM | POA: Diagnosis not present

## 2015-05-02 DIAGNOSIS — I132 Hypertensive heart and chronic kidney disease with heart failure and with stage 5 chronic kidney disease, or end stage renal disease: Secondary | ICD-10-CM | POA: Diagnosis not present

## 2015-05-02 DIAGNOSIS — E11610 Type 2 diabetes mellitus with diabetic neuropathic arthropathy: Secondary | ICD-10-CM | POA: Diagnosis not present

## 2015-05-02 DIAGNOSIS — E1122 Type 2 diabetes mellitus with diabetic chronic kidney disease: Secondary | ICD-10-CM | POA: Diagnosis not present

## 2015-05-07 DIAGNOSIS — I132 Hypertensive heart and chronic kidney disease with heart failure and with stage 5 chronic kidney disease, or end stage renal disease: Secondary | ICD-10-CM | POA: Diagnosis not present

## 2015-05-07 DIAGNOSIS — E1122 Type 2 diabetes mellitus with diabetic chronic kidney disease: Secondary | ICD-10-CM | POA: Diagnosis not present

## 2015-05-07 DIAGNOSIS — N185 Chronic kidney disease, stage 5: Secondary | ICD-10-CM | POA: Diagnosis not present

## 2015-05-07 DIAGNOSIS — I5032 Chronic diastolic (congestive) heart failure: Secondary | ICD-10-CM | POA: Diagnosis not present

## 2015-05-07 DIAGNOSIS — Z4789 Encounter for other orthopedic aftercare: Secondary | ICD-10-CM | POA: Diagnosis not present

## 2015-05-07 DIAGNOSIS — E11610 Type 2 diabetes mellitus with diabetic neuropathic arthropathy: Secondary | ICD-10-CM | POA: Diagnosis not present

## 2015-05-08 DIAGNOSIS — Z4789 Encounter for other orthopedic aftercare: Secondary | ICD-10-CM | POA: Diagnosis not present

## 2015-05-08 DIAGNOSIS — E11610 Type 2 diabetes mellitus with diabetic neuropathic arthropathy: Secondary | ICD-10-CM | POA: Diagnosis not present

## 2015-05-08 DIAGNOSIS — N185 Chronic kidney disease, stage 5: Secondary | ICD-10-CM | POA: Diagnosis not present

## 2015-05-08 DIAGNOSIS — I132 Hypertensive heart and chronic kidney disease with heart failure and with stage 5 chronic kidney disease, or end stage renal disease: Secondary | ICD-10-CM | POA: Diagnosis not present

## 2015-05-08 DIAGNOSIS — E1122 Type 2 diabetes mellitus with diabetic chronic kidney disease: Secondary | ICD-10-CM | POA: Diagnosis not present

## 2015-05-08 DIAGNOSIS — I5032 Chronic diastolic (congestive) heart failure: Secondary | ICD-10-CM | POA: Diagnosis not present

## 2015-05-10 DIAGNOSIS — Z4789 Encounter for other orthopedic aftercare: Secondary | ICD-10-CM | POA: Diagnosis not present

## 2015-05-10 DIAGNOSIS — E11610 Type 2 diabetes mellitus with diabetic neuropathic arthropathy: Secondary | ICD-10-CM | POA: Diagnosis not present

## 2015-05-10 DIAGNOSIS — N185 Chronic kidney disease, stage 5: Secondary | ICD-10-CM | POA: Diagnosis not present

## 2015-05-10 DIAGNOSIS — I5032 Chronic diastolic (congestive) heart failure: Secondary | ICD-10-CM | POA: Diagnosis not present

## 2015-05-10 DIAGNOSIS — I132 Hypertensive heart and chronic kidney disease with heart failure and with stage 5 chronic kidney disease, or end stage renal disease: Secondary | ICD-10-CM | POA: Diagnosis not present

## 2015-05-10 DIAGNOSIS — E1122 Type 2 diabetes mellitus with diabetic chronic kidney disease: Secondary | ICD-10-CM | POA: Diagnosis not present

## 2015-05-15 DIAGNOSIS — E1122 Type 2 diabetes mellitus with diabetic chronic kidney disease: Secondary | ICD-10-CM | POA: Diagnosis not present

## 2015-05-15 DIAGNOSIS — E11610 Type 2 diabetes mellitus with diabetic neuropathic arthropathy: Secondary | ICD-10-CM | POA: Diagnosis not present

## 2015-05-15 DIAGNOSIS — N185 Chronic kidney disease, stage 5: Secondary | ICD-10-CM | POA: Diagnosis not present

## 2015-05-15 DIAGNOSIS — Z4789 Encounter for other orthopedic aftercare: Secondary | ICD-10-CM | POA: Diagnosis not present

## 2015-05-15 DIAGNOSIS — I5032 Chronic diastolic (congestive) heart failure: Secondary | ICD-10-CM | POA: Diagnosis not present

## 2015-05-15 DIAGNOSIS — I132 Hypertensive heart and chronic kidney disease with heart failure and with stage 5 chronic kidney disease, or end stage renal disease: Secondary | ICD-10-CM | POA: Diagnosis not present

## 2015-05-17 DIAGNOSIS — I132 Hypertensive heart and chronic kidney disease with heart failure and with stage 5 chronic kidney disease, or end stage renal disease: Secondary | ICD-10-CM | POA: Diagnosis not present

## 2015-05-17 DIAGNOSIS — N185 Chronic kidney disease, stage 5: Secondary | ICD-10-CM | POA: Diagnosis not present

## 2015-05-17 DIAGNOSIS — I5032 Chronic diastolic (congestive) heart failure: Secondary | ICD-10-CM | POA: Diagnosis not present

## 2015-05-17 DIAGNOSIS — E11610 Type 2 diabetes mellitus with diabetic neuropathic arthropathy: Secondary | ICD-10-CM | POA: Diagnosis not present

## 2015-05-17 DIAGNOSIS — E1122 Type 2 diabetes mellitus with diabetic chronic kidney disease: Secondary | ICD-10-CM | POA: Diagnosis not present

## 2015-05-17 DIAGNOSIS — Z4789 Encounter for other orthopedic aftercare: Secondary | ICD-10-CM | POA: Diagnosis not present

## 2015-05-21 DIAGNOSIS — E1122 Type 2 diabetes mellitus with diabetic chronic kidney disease: Secondary | ICD-10-CM | POA: Diagnosis not present

## 2015-05-21 DIAGNOSIS — I5032 Chronic diastolic (congestive) heart failure: Secondary | ICD-10-CM | POA: Diagnosis not present

## 2015-05-21 DIAGNOSIS — Z4789 Encounter for other orthopedic aftercare: Secondary | ICD-10-CM | POA: Diagnosis not present

## 2015-05-21 DIAGNOSIS — I132 Hypertensive heart and chronic kidney disease with heart failure and with stage 5 chronic kidney disease, or end stage renal disease: Secondary | ICD-10-CM | POA: Diagnosis not present

## 2015-05-21 DIAGNOSIS — E11610 Type 2 diabetes mellitus with diabetic neuropathic arthropathy: Secondary | ICD-10-CM | POA: Diagnosis not present

## 2015-05-21 DIAGNOSIS — N185 Chronic kidney disease, stage 5: Secondary | ICD-10-CM | POA: Diagnosis not present

## 2015-05-24 DIAGNOSIS — N185 Chronic kidney disease, stage 5: Secondary | ICD-10-CM | POA: Diagnosis not present

## 2015-05-24 DIAGNOSIS — I5032 Chronic diastolic (congestive) heart failure: Secondary | ICD-10-CM | POA: Diagnosis not present

## 2015-05-24 DIAGNOSIS — E11610 Type 2 diabetes mellitus with diabetic neuropathic arthropathy: Secondary | ICD-10-CM | POA: Diagnosis not present

## 2015-05-24 DIAGNOSIS — E1122 Type 2 diabetes mellitus with diabetic chronic kidney disease: Secondary | ICD-10-CM | POA: Diagnosis not present

## 2015-05-24 DIAGNOSIS — Z4789 Encounter for other orthopedic aftercare: Secondary | ICD-10-CM | POA: Diagnosis not present

## 2015-05-24 DIAGNOSIS — I132 Hypertensive heart and chronic kidney disease with heart failure and with stage 5 chronic kidney disease, or end stage renal disease: Secondary | ICD-10-CM | POA: Diagnosis not present

## 2015-05-28 DIAGNOSIS — I5032 Chronic diastolic (congestive) heart failure: Secondary | ICD-10-CM | POA: Diagnosis not present

## 2015-05-28 DIAGNOSIS — Z4789 Encounter for other orthopedic aftercare: Secondary | ICD-10-CM | POA: Diagnosis not present

## 2015-05-28 DIAGNOSIS — I132 Hypertensive heart and chronic kidney disease with heart failure and with stage 5 chronic kidney disease, or end stage renal disease: Secondary | ICD-10-CM | POA: Diagnosis not present

## 2015-05-28 DIAGNOSIS — E11610 Type 2 diabetes mellitus with diabetic neuropathic arthropathy: Secondary | ICD-10-CM | POA: Diagnosis not present

## 2015-05-28 DIAGNOSIS — E1122 Type 2 diabetes mellitus with diabetic chronic kidney disease: Secondary | ICD-10-CM | POA: Diagnosis not present

## 2015-05-28 DIAGNOSIS — N185 Chronic kidney disease, stage 5: Secondary | ICD-10-CM | POA: Diagnosis not present

## 2015-05-31 DIAGNOSIS — Z4789 Encounter for other orthopedic aftercare: Secondary | ICD-10-CM | POA: Diagnosis not present

## 2015-05-31 DIAGNOSIS — N185 Chronic kidney disease, stage 5: Secondary | ICD-10-CM | POA: Diagnosis not present

## 2015-05-31 DIAGNOSIS — E11610 Type 2 diabetes mellitus with diabetic neuropathic arthropathy: Secondary | ICD-10-CM | POA: Diagnosis not present

## 2015-05-31 DIAGNOSIS — I5032 Chronic diastolic (congestive) heart failure: Secondary | ICD-10-CM | POA: Diagnosis not present

## 2015-05-31 DIAGNOSIS — E1122 Type 2 diabetes mellitus with diabetic chronic kidney disease: Secondary | ICD-10-CM | POA: Diagnosis not present

## 2015-05-31 DIAGNOSIS — I132 Hypertensive heart and chronic kidney disease with heart failure and with stage 5 chronic kidney disease, or end stage renal disease: Secondary | ICD-10-CM | POA: Diagnosis not present

## 2015-06-04 DIAGNOSIS — I132 Hypertensive heart and chronic kidney disease with heart failure and with stage 5 chronic kidney disease, or end stage renal disease: Secondary | ICD-10-CM | POA: Diagnosis not present

## 2015-06-04 DIAGNOSIS — N185 Chronic kidney disease, stage 5: Secondary | ICD-10-CM | POA: Diagnosis not present

## 2015-06-04 DIAGNOSIS — Z4789 Encounter for other orthopedic aftercare: Secondary | ICD-10-CM | POA: Diagnosis not present

## 2015-06-04 DIAGNOSIS — E1122 Type 2 diabetes mellitus with diabetic chronic kidney disease: Secondary | ICD-10-CM | POA: Diagnosis not present

## 2015-06-04 DIAGNOSIS — I5032 Chronic diastolic (congestive) heart failure: Secondary | ICD-10-CM | POA: Diagnosis not present

## 2015-06-04 DIAGNOSIS — E11610 Type 2 diabetes mellitus with diabetic neuropathic arthropathy: Secondary | ICD-10-CM | POA: Diagnosis not present

## 2015-06-07 DIAGNOSIS — E1122 Type 2 diabetes mellitus with diabetic chronic kidney disease: Secondary | ICD-10-CM | POA: Diagnosis not present

## 2015-06-07 DIAGNOSIS — I5032 Chronic diastolic (congestive) heart failure: Secondary | ICD-10-CM | POA: Diagnosis not present

## 2015-06-07 DIAGNOSIS — I132 Hypertensive heart and chronic kidney disease with heart failure and with stage 5 chronic kidney disease, or end stage renal disease: Secondary | ICD-10-CM | POA: Diagnosis not present

## 2015-06-07 DIAGNOSIS — E11610 Type 2 diabetes mellitus with diabetic neuropathic arthropathy: Secondary | ICD-10-CM | POA: Diagnosis not present

## 2015-06-07 DIAGNOSIS — Z4789 Encounter for other orthopedic aftercare: Secondary | ICD-10-CM | POA: Diagnosis not present

## 2015-06-07 DIAGNOSIS — M14672 Charcot's joint, left ankle and foot: Secondary | ICD-10-CM | POA: Diagnosis not present

## 2015-06-07 DIAGNOSIS — M170 Bilateral primary osteoarthritis of knee: Secondary | ICD-10-CM | POA: Diagnosis not present

## 2015-06-07 DIAGNOSIS — N185 Chronic kidney disease, stage 5: Secondary | ICD-10-CM | POA: Diagnosis not present

## 2015-06-28 DIAGNOSIS — J0100 Acute maxillary sinusitis, unspecified: Secondary | ICD-10-CM | POA: Diagnosis not present

## 2015-07-16 ENCOUNTER — Encounter (HOSPITAL_COMMUNITY): Payer: Self-pay

## 2015-07-16 ENCOUNTER — Emergency Department (HOSPITAL_COMMUNITY)
Admission: EM | Admit: 2015-07-16 | Discharge: 2015-07-16 | Disposition: A | Discharge status: Home / Self Care | Payer: Medicare Other | Attending: Emergency Medicine | Admitting: Emergency Medicine

## 2015-07-16 ENCOUNTER — Other Ambulatory Visit: Payer: Self-pay

## 2015-07-16 DIAGNOSIS — R05 Cough: Secondary | ICD-10-CM | POA: Diagnosis not present

## 2015-07-16 DIAGNOSIS — I120 Hypertensive chronic kidney disease with stage 5 chronic kidney disease or end stage renal disease: Secondary | ICD-10-CM | POA: Diagnosis not present

## 2015-07-16 DIAGNOSIS — J90 Pleural effusion, not elsewhere classified: Secondary | ICD-10-CM | POA: Diagnosis not present

## 2015-07-16 DIAGNOSIS — F17210 Nicotine dependence, cigarettes, uncomplicated: Secondary | ICD-10-CM | POA: Diagnosis not present

## 2015-07-16 DIAGNOSIS — R011 Cardiac murmur, unspecified: Secondary | ICD-10-CM | POA: Diagnosis not present

## 2015-07-16 DIAGNOSIS — I517 Cardiomegaly: Secondary | ICD-10-CM | POA: Diagnosis not present

## 2015-07-16 DIAGNOSIS — N185 Chronic kidney disease, stage 5: Secondary | ICD-10-CM | POA: Diagnosis not present

## 2015-07-16 DIAGNOSIS — I509 Heart failure, unspecified: Secondary | ICD-10-CM | POA: Insufficient documentation

## 2015-07-16 DIAGNOSIS — R0601 Orthopnea: Secondary | ICD-10-CM | POA: Diagnosis not present

## 2015-07-16 DIAGNOSIS — I2510 Atherosclerotic heart disease of native coronary artery without angina pectoris: Secondary | ICD-10-CM | POA: Diagnosis not present

## 2015-07-16 DIAGNOSIS — R0602 Shortness of breath: Secondary | ICD-10-CM | POA: Diagnosis not present

## 2015-07-16 DIAGNOSIS — S22009A Unspecified fracture of unspecified thoracic vertebra, initial encounter for closed fracture: Secondary | ICD-10-CM | POA: Diagnosis not present

## 2015-07-16 DIAGNOSIS — E119 Type 2 diabetes mellitus without complications: Secondary | ICD-10-CM | POA: Insufficient documentation

## 2015-07-16 DIAGNOSIS — J811 Chronic pulmonary edema: Secondary | ICD-10-CM | POA: Diagnosis not present

## 2015-07-16 DIAGNOSIS — E1137X1 Type 2 diabetes mellitus with diabetic macular edema, resolved following treatment, right eye: Secondary | ICD-10-CM | POA: Diagnosis not present

## 2015-07-16 LAB — CBC WITH DIFFERENTIAL/PLATELET
BASOS PCT: 1 %
Basophils Absolute: 0 10*3/uL (ref 0.0–0.1)
EOS ABS: 0.1 10*3/uL (ref 0.0–0.7)
Eosinophils Relative: 1 %
HCT: 36.6 % (ref 36.0–46.0)
HEMOGLOBIN: 11.6 g/dL — AB (ref 12.0–15.0)
Lymphocytes Relative: 20 %
Lymphs Abs: 1.3 10*3/uL (ref 0.7–4.0)
MCH: 29.7 pg (ref 26.0–34.0)
MCHC: 31.7 g/dL (ref 30.0–36.0)
MCV: 93.6 fL (ref 78.0–100.0)
MONOS PCT: 5 %
Monocytes Absolute: 0.3 10*3/uL (ref 0.1–1.0)
NEUTROS PCT: 73 %
Neutro Abs: 4.9 10*3/uL (ref 1.7–7.7)
PLATELETS: 220 10*3/uL (ref 150–400)
RBC: 3.91 MIL/uL (ref 3.87–5.11)
RDW: 16.5 % — AB (ref 11.5–15.5)
WBC: 6.7 10*3/uL (ref 4.0–10.5)

## 2015-07-16 LAB — I-STAT TROPONIN, ED: TROPONIN I, POC: 0.02 ng/mL (ref 0.00–0.08)

## 2015-07-16 LAB — BASIC METABOLIC PANEL
Anion gap: 14 (ref 5–15)
BUN: 49 mg/dL — ABNORMAL HIGH (ref 6–20)
CALCIUM: 9.1 mg/dL (ref 8.9–10.3)
CHLORIDE: 94 mmol/L — AB (ref 101–111)
CO2: 24 mmol/L (ref 22–32)
CREATININE: 4.54 mg/dL — AB (ref 0.44–1.00)
GFR, EST AFRICAN AMERICAN: 11 mL/min — AB (ref >60)
GFR, EST NON AFRICAN AMERICAN: 9 mL/min — AB (ref >60)
Glucose, Bld: 664 mg/dL (ref 65–99)
Potassium: 3.8 mmol/L (ref 3.5–5.1)
SODIUM: 132 mmol/L — AB (ref 135–145)

## 2015-07-16 LAB — BRAIN NATRIURETIC PEPTIDE: B Natriuretic Peptide: 322.3 pg/mL — ABNORMAL HIGH (ref 0.0–100.0)

## 2015-07-16 NOTE — ED Notes (Signed)
Patient here with cough, congestion and shortness of breath x 2 weeks, hx of bronchitis/COPD

## 2015-07-16 NOTE — ED Notes (Signed)
Registration handed this RN the patients stickers and told me that the patient had left and "family stated that if she gets worse then they will bring her back."This RN did not witness patient reporting she was leaving.

## 2015-07-26 DIAGNOSIS — E1142 Type 2 diabetes mellitus with diabetic polyneuropathy: Secondary | ICD-10-CM | POA: Diagnosis not present

## 2015-07-26 DIAGNOSIS — M79671 Pain in right foot: Secondary | ICD-10-CM | POA: Diagnosis not present

## 2015-07-26 DIAGNOSIS — M19072 Primary osteoarthritis, left ankle and foot: Secondary | ICD-10-CM | POA: Diagnosis not present

## 2015-07-26 DIAGNOSIS — M79674 Pain in right toe(s): Secondary | ICD-10-CM | POA: Diagnosis not present

## 2015-09-06 DIAGNOSIS — E1142 Type 2 diabetes mellitus with diabetic polyneuropathy: Secondary | ICD-10-CM | POA: Diagnosis not present

## 2015-09-06 DIAGNOSIS — M79674 Pain in right toe(s): Secondary | ICD-10-CM | POA: Diagnosis not present

## 2015-09-06 DIAGNOSIS — M19072 Primary osteoarthritis, left ankle and foot: Secondary | ICD-10-CM | POA: Diagnosis not present

## 2015-09-06 DIAGNOSIS — M79671 Pain in right foot: Secondary | ICD-10-CM | POA: Diagnosis not present

## 2015-11-29 ENCOUNTER — Encounter (HOSPITAL_COMMUNITY): Payer: Self-pay | Admitting: Emergency Medicine

## 2015-11-29 ENCOUNTER — Emergency Department (HOSPITAL_COMMUNITY): Payer: Medicare Other

## 2015-11-29 ENCOUNTER — Inpatient Hospital Stay (HOSPITAL_COMMUNITY)
Admission: EM | Admit: 2015-11-29 | Discharge: 2015-12-05 | DRG: 291 | Disposition: A | Discharge status: Home Health | Payer: Medicare Other | Attending: Internal Medicine | Admitting: Internal Medicine

## 2015-11-29 DIAGNOSIS — R05 Cough: Secondary | ICD-10-CM | POA: Diagnosis not present

## 2015-11-29 DIAGNOSIS — I130 Hypertensive heart and chronic kidney disease with heart failure and stage 1 through stage 4 chronic kidney disease, or unspecified chronic kidney disease: Principal | ICD-10-CM | POA: Diagnosis present

## 2015-11-29 DIAGNOSIS — K219 Gastro-esophageal reflux disease without esophagitis: Secondary | ICD-10-CM | POA: Diagnosis present

## 2015-11-29 DIAGNOSIS — O1002 Pre-existing essential hypertension complicating childbirth: Secondary | ICD-10-CM | POA: Diagnosis present

## 2015-11-29 DIAGNOSIS — R0600 Dyspnea, unspecified: Secondary | ICD-10-CM | POA: Diagnosis not present

## 2015-11-29 DIAGNOSIS — N189 Chronic kidney disease, unspecified: Secondary | ICD-10-CM

## 2015-11-29 DIAGNOSIS — F319 Bipolar disorder, unspecified: Secondary | ICD-10-CM | POA: Diagnosis present

## 2015-11-29 DIAGNOSIS — I82401 Acute embolism and thrombosis of unspecified deep veins of right lower extremity: Secondary | ICD-10-CM

## 2015-11-29 DIAGNOSIS — J9601 Acute respiratory failure with hypoxia: Secondary | ICD-10-CM | POA: Diagnosis not present

## 2015-11-29 DIAGNOSIS — F3170 Bipolar disorder, currently in remission, most recent episode unspecified: Secondary | ICD-10-CM | POA: Diagnosis not present

## 2015-11-29 DIAGNOSIS — E1165 Type 2 diabetes mellitus with hyperglycemia: Secondary | ICD-10-CM | POA: Diagnosis present

## 2015-11-29 DIAGNOSIS — E11610 Type 2 diabetes mellitus with diabetic neuropathic arthropathy: Secondary | ICD-10-CM

## 2015-11-29 DIAGNOSIS — I82411 Acute embolism and thrombosis of right femoral vein: Secondary | ICD-10-CM | POA: Diagnosis present

## 2015-11-29 DIAGNOSIS — IMO0001 Reserved for inherently not codable concepts without codable children: Secondary | ICD-10-CM

## 2015-11-29 DIAGNOSIS — E1122 Type 2 diabetes mellitus with diabetic chronic kidney disease: Secondary | ICD-10-CM | POA: Diagnosis present

## 2015-11-29 DIAGNOSIS — I509 Heart failure, unspecified: Secondary | ICD-10-CM

## 2015-11-29 DIAGNOSIS — D631 Anemia in chronic kidney disease: Secondary | ICD-10-CM | POA: Diagnosis present

## 2015-11-29 DIAGNOSIS — I110 Hypertensive heart disease with heart failure: Secondary | ICD-10-CM | POA: Diagnosis not present

## 2015-11-29 DIAGNOSIS — I5043 Acute on chronic combined systolic (congestive) and diastolic (congestive) heart failure: Secondary | ICD-10-CM | POA: Diagnosis present

## 2015-11-29 DIAGNOSIS — E119 Type 2 diabetes mellitus without complications: Secondary | ICD-10-CM

## 2015-11-29 DIAGNOSIS — E1159 Type 2 diabetes mellitus with other circulatory complications: Secondary | ICD-10-CM | POA: Diagnosis present

## 2015-11-29 DIAGNOSIS — I5033 Acute on chronic diastolic (congestive) heart failure: Secondary | ICD-10-CM | POA: Diagnosis not present

## 2015-11-29 DIAGNOSIS — G4730 Sleep apnea, unspecified: Secondary | ICD-10-CM | POA: Diagnosis present

## 2015-11-29 DIAGNOSIS — N184 Chronic kidney disease, stage 4 (severe): Secondary | ICD-10-CM | POA: Diagnosis not present

## 2015-11-29 DIAGNOSIS — F17210 Nicotine dependence, cigarettes, uncomplicated: Secondary | ICD-10-CM | POA: Diagnosis not present

## 2015-11-29 DIAGNOSIS — I2510 Atherosclerotic heart disease of native coronary artery without angina pectoris: Secondary | ICD-10-CM | POA: Diagnosis present

## 2015-11-29 DIAGNOSIS — Z794 Long term (current) use of insulin: Secondary | ICD-10-CM

## 2015-11-29 DIAGNOSIS — M7989 Other specified soft tissue disorders: Secondary | ICD-10-CM

## 2015-11-29 DIAGNOSIS — I10 Essential (primary) hypertension: Secondary | ICD-10-CM | POA: Diagnosis present

## 2015-11-29 DIAGNOSIS — R0602 Shortness of breath: Secondary | ICD-10-CM | POA: Diagnosis not present

## 2015-11-29 HISTORY — DX: Acute respiratory failure with hypoxia: J96.01

## 2015-11-29 LAB — BASIC METABOLIC PANEL
Anion gap: 9 (ref 5–15)
BUN: 52 mg/dL — AB (ref 6–20)
CALCIUM: 8.9 mg/dL (ref 8.9–10.3)
CO2: 19 mmol/L — AB (ref 22–32)
CREATININE: 3.86 mg/dL — AB (ref 0.44–1.00)
Chloride: 110 mmol/L (ref 101–111)
GFR calc non Af Amer: 11 mL/min — ABNORMAL LOW (ref >60)
GFR, EST AFRICAN AMERICAN: 13 mL/min — AB (ref >60)
GLUCOSE: 245 mg/dL — AB (ref 65–99)
Potassium: 4.2 mmol/L (ref 3.5–5.1)
Sodium: 138 mmol/L (ref 135–145)

## 2015-11-29 LAB — CBC
HCT: 27.1 % — ABNORMAL LOW (ref 36.0–46.0)
Hemoglobin: 8.7 g/dL — ABNORMAL LOW (ref 12.0–15.0)
MCH: 30.7 pg (ref 26.0–34.0)
MCHC: 32.1 g/dL (ref 30.0–36.0)
MCV: 95.8 fL (ref 78.0–100.0)
PLATELETS: 234 10*3/uL (ref 150–400)
RBC: 2.83 MIL/uL — AB (ref 3.87–5.11)
RDW: 15.6 % — AB (ref 11.5–15.5)
WBC: 7.6 10*3/uL (ref 4.0–10.5)

## 2015-11-29 LAB — I-STAT TROPONIN, ED: TROPONIN I, POC: 0.03 ng/mL (ref 0.00–0.08)

## 2015-11-29 LAB — TROPONIN I: Troponin I: 0.03 ng/mL (ref <0.03)

## 2015-11-29 LAB — GLUCOSE, CAPILLARY: Glucose-Capillary: 192 mg/dL — ABNORMAL HIGH (ref 65–99)

## 2015-11-29 LAB — BRAIN NATRIURETIC PEPTIDE: B NATRIURETIC PEPTIDE 5: 356.2 pg/mL — AB (ref 0.0–100.0)

## 2015-11-29 MED ORDER — BENZTROPINE MESYLATE 1 MG PO TABS
0.5000 mg | ORAL_TABLET | Freq: Two times a day (BID) | ORAL | Status: DC
  Administered 2015-11-29 – 2015-12-05 (×12): 0.5 mg via ORAL
  Filled 2015-11-29 (×23): qty 1

## 2015-11-29 MED ORDER — ISOSORBIDE MONONITRATE ER 60 MG PO TB24
60.0000 mg | ORAL_TABLET | Freq: Every day | ORAL | Status: DC
  Administered 2015-11-29 – 2015-12-05 (×7): 60 mg via ORAL
  Filled 2015-11-29 (×7): qty 1

## 2015-11-29 MED ORDER — ONDANSETRON HCL 4 MG/2ML IJ SOLN: 4.0000 mg | Freq: Four times a day (QID) | INTRAMUSCULAR | Status: DC | PRN

## 2015-11-29 MED ORDER — SODIUM CHLORIDE 0.9% FLUSH
3.0000 mL | INTRAVENOUS | Status: DC | PRN
Start: 2015-11-29 — End: 2015-12-05

## 2015-11-29 MED ORDER — HEPARIN SODIUM (PORCINE) 5000 UNIT/ML IJ SOLN
5000.0000 [IU] | Freq: Three times a day (TID) | INTRAMUSCULAR | Status: DC
  Administered 2015-11-29 – 2015-11-30 (×3): 5000 [IU] via SUBCUTANEOUS
  Filled 2015-11-29 (×3): qty 1

## 2015-11-29 MED ORDER — CHLORHEXIDINE GLUCONATE 0.12 % MT SOLN
15.0000 mL | Freq: Two times a day (BID) | OROMUCOSAL | Status: DC
  Administered 2015-11-30 – 2015-12-05 (×8): 15 mL via OROMUCOSAL
  Filled 2015-11-29 (×9): qty 15

## 2015-11-29 MED ORDER — QUETIAPINE FUMARATE 300 MG PO TABS
400.0000 mg | ORAL_TABLET | Freq: Every day | ORAL | Status: DC
  Administered 2015-11-29 – 2015-12-04 (×6): 400 mg via ORAL
  Filled 2015-11-29 (×6): qty 1

## 2015-11-29 MED ORDER — GABAPENTIN 300 MG PO CAPS
300.0000 mg | ORAL_CAPSULE | Freq: Every day | ORAL | Status: DC
  Administered 2015-11-30 – 2015-12-05 (×6): 300 mg via ORAL
  Filled 2015-11-29 (×7): qty 1

## 2015-11-29 MED ORDER — FLUPHENAZINE HCL 1 MG PO TABS
1.0000 mg | ORAL_TABLET | Freq: Two times a day (BID) | ORAL | Status: DC
  Administered 2015-11-29 – 2015-12-05 (×12): 1 mg via ORAL
  Filled 2015-11-29 (×12): qty 1

## 2015-11-29 MED ORDER — FUROSEMIDE 10 MG/ML IJ SOLN
60.0000 mg | Freq: Two times a day (BID) | INTRAMUSCULAR | Status: DC
  Administered 2015-11-29 – 2015-12-03 (×9): 60 mg via INTRAVENOUS
  Filled 2015-11-29 (×9): qty 6

## 2015-11-29 MED ORDER — ALBUTEROL SULFATE (2.5 MG/3ML) 0.083% IN NEBU: 3.0000 mL | INHALATION_SOLUTION | Freq: Four times a day (QID) | RESPIRATORY_TRACT | Status: DC | PRN

## 2015-11-29 MED ORDER — SODIUM CHLORIDE 0.9% FLUSH
3.0000 mL | Freq: Two times a day (BID) | INTRAVENOUS | Status: DC
  Administered 2015-11-29 – 2015-12-03 (×8): 3 mL via INTRAVENOUS

## 2015-11-29 MED ORDER — CETYLPYRIDINIUM CHLORIDE 0.05 % MT LIQD
7.0000 mL | Freq: Two times a day (BID) | OROMUCOSAL | Status: DC
  Administered 2015-12-04: 7 mL via OROMUCOSAL

## 2015-11-29 MED ORDER — ATORVASTATIN CALCIUM 40 MG PO TABS
40.0000 mg | ORAL_TABLET | Freq: Every evening | ORAL | Status: DC
  Administered 2015-11-29 – 2015-12-04 (×6): 40 mg via ORAL
  Filled 2015-11-29 (×6): qty 1

## 2015-11-29 MED ORDER — INSULIN ASPART 100 UNIT/ML ~~LOC~~ SOLN
0.0000 [IU] | Freq: Three times a day (TID) | SUBCUTANEOUS | Status: DC
  Administered 2015-11-30: 3 [IU] via SUBCUTANEOUS
  Administered 2015-12-01: 2 [IU] via SUBCUTANEOUS
  Administered 2015-12-02: 3 [IU] via SUBCUTANEOUS
  Administered 2015-12-02: 5 [IU] via SUBCUTANEOUS
  Administered 2015-12-03 (×2): 2 [IU] via SUBCUTANEOUS
  Administered 2015-12-03: 8 [IU] via SUBCUTANEOUS
  Administered 2015-12-04: 2 [IU] via SUBCUTANEOUS
  Administered 2015-12-04: 15 [IU] via SUBCUTANEOUS
  Administered 2015-12-05: 2 [IU] via SUBCUTANEOUS

## 2015-11-29 MED ORDER — ACETAMINOPHEN 325 MG PO TABS: 650.0000 mg | ORAL_TABLET | ORAL | Status: DC | PRN

## 2015-11-29 MED ORDER — AMLODIPINE BESYLATE 10 MG PO TABS
10.0000 mg | ORAL_TABLET | Freq: Every day | ORAL | Status: DC
  Administered 2015-11-29 – 2015-12-05 (×7): 10 mg via ORAL
  Filled 2015-11-29 (×7): qty 1

## 2015-11-29 MED ORDER — ASPIRIN EC 81 MG PO TBEC
81.0000 mg | DELAYED_RELEASE_TABLET | Freq: Every day | ORAL | Status: DC
  Administered 2015-11-29 – 2015-12-05 (×7): 81 mg via ORAL
  Filled 2015-11-29 (×7): qty 1

## 2015-11-29 MED ORDER — PANTOPRAZOLE SODIUM 20 MG PO TBEC
20.0000 mg | DELAYED_RELEASE_TABLET | Freq: Every day | ORAL | Status: DC
  Administered 2015-11-29 – 2015-12-05 (×7): 20 mg via ORAL
  Filled 2015-11-29 (×7): qty 1

## 2015-11-29 MED ORDER — INSULIN ASPART 100 UNIT/ML ~~LOC~~ SOLN
4.0000 [IU] | Freq: Three times a day (TID) | SUBCUTANEOUS | Status: DC
  Administered 2015-11-30 – 2015-12-01 (×4): 4 [IU] via SUBCUTANEOUS

## 2015-11-29 MED ORDER — INSULIN GLARGINE 100 UNIT/ML ~~LOC~~ SOLN
30.0000 [IU] | Freq: Every day | SUBCUTANEOUS | Status: DC
  Administered 2015-11-29 – 2015-11-30 (×2): 30 [IU] via SUBCUTANEOUS
  Filled 2015-11-29 (×2): qty 0.3

## 2015-11-29 MED ORDER — ASPIRIN 81 MG PO CHEW
324.0000 mg | CHEWABLE_TABLET | Freq: Once | ORAL | Status: AC
Start: 2015-11-29 — End: 2015-11-29
  Administered 2015-11-29: 324 mg via ORAL
  Filled 2015-11-29: qty 4

## 2015-11-29 MED ORDER — SODIUM CHLORIDE 0.9 % IV SOLN: 250.0000 mL | INTRAVENOUS | Status: DC | PRN

## 2015-11-29 MED ORDER — HYDRALAZINE HCL 50 MG PO TABS
100.0000 mg | ORAL_TABLET | Freq: Two times a day (BID) | ORAL | Status: DC
Start: 2015-11-29 — End: 2015-12-05
  Administered 2015-11-29 – 2015-12-05 (×12): 100 mg via ORAL
  Filled 2015-11-29 (×12): qty 2

## 2015-11-29 NOTE — ED Provider Notes (Signed)
CSN: [id number]     Arrival date & time 11/29/15  1801 History   First MD Initiated Contact with Patient 11/29/15 1925     Chief Complaint  Patient presents with  . Shortness of Breath  . Chest Pain     (Consider location/radiation/quality/duration/timing/severity/associated sxs/prior Treatment) HPI   Robin [name] is a 66 y.o. female who presents for evaluation of shortness of breath, right-sided swelling, cough, wheezing, nausea and vomiting for several days. Symptoms are recurrent, previously the same, several months ago. She is taking her usual medications, without relief. She denies ongoing chest pain. She has not seen her physician, recently. No other recent illnesses. There are no other known modifying factors.   Past Medical History  Diagnosis Date  . Coronary artery disease   . Bipolar disorder ([place])   . History of blood transfusion   . Benign hypertension   . Anemia   . Heart murmur   . CHF (congestive heart failure) ([place])   . Sleep apnea   . Shortness of breath dyspnea   . History of pneumonia   . History of bronchitis   . Depression   . Anxiety   . GERD (gastroesophageal reflux disease)   . Arthritis   . Diabetes mellitus without complication ([place])   . Type 2 diabetes mellitus ([place])   . Chronic kidney disease, stage V ([place])     Nephrologist is with [facility] (Dr. Tammi Klippel)   Past Surgical History  Procedure Laterality Date  . Cardiac catheterization      2 stent   . Av fistula placement Left may-2016    done at [facility]  . Tonsillectomy    . Abdominal hysterectomy    . Cholecystectomy    . Ankle closed reduction N/A 11/17/2014    Procedure: CLOSED REDUCTION ANKLE;  Surgeon: Earlie Server, MD;  Location: [facility];  Service: Orthopedics;  Laterality: N/A;  . Orif ankle fracture Left 11/20/2014    Procedure: OPEN REDUCTION INTERNAL FIXATION (ORIF) ANKLE FRACTURE;  Surgeon: Renette Butters, MD;  Location: [facility];  Service: Orthopedics;  Laterality: Left;  .  Coronary stent placement    . Hardware removal Left 03/16/2015    Procedure: Removal Hardware Left Ankle;  Surgeon: Newt Minion, MD;  Location: [facility];  Service: Orthopedics;  Laterality: Left;  . Ankle fusion Left 03/16/2015    Procedure: Left Tibiocalcaneal Fusion;  Surgeon: Newt Minion, MD;  Location: [facility];  Service: Orthopedics;  Laterality: Left;   History reviewed. No pertinent family history. Social History  Substance Use Topics  . Smoking status: Current Every Day Smoker -- 0.50 packs/day    Types: Cigarettes  . Smokeless tobacco: None  . Alcohol Use: No   OB History    No data available     Review of Systems  All other systems reviewed and are negative.     Allergies  Oxycodone  Home Medications   Prior to Admission medications   Medication Sig Start Date End Date Taking? Authorizing Provider  albuterol (PROVENTIL HFA;VENTOLIN HFA) 108 (90 BASE) MCG/ACT inhaler Inhale 1-2 puffs into the lungs every 6 (six) hours as needed for wheezing or shortness of breath.   Yes Historical Provider, MD  amLODipine (NORVASC) 10 MG tablet Take 10 mg by mouth daily.   Yes Historical Provider, MD  aspirin EC 81 MG tablet Take 81 mg by mouth daily.   Yes Historical Provider, MD  atorvastatin (LIPITOR) 80 MG tablet Take 40 mg by mouth  every evening.   Yes Historical Provider, MD  benztropine (COGENTIN) 0.5 MG tablet Take 0.5 mg by mouth 2 (two) times daily.   Yes Historical Provider, MD  bisacodyl (BISACODYL) 5 MG EC tablet Take 5 mg by mouth daily as needed for moderate constipation.   Yes Historical Provider, MD  bumetanide (BUMEX) 2 MG tablet Take 4 mg by mouth 2 (two) times daily.    Yes Historical Provider, MD  carvedilol (COREG) 25 MG tablet Take 25 mg by mouth 2 (two) times daily with a meal.   Yes Historical Provider, MD  darbepoetin (ARANESP) 40 MCG/0.4ML SOLN injection Inject 40 mcg into the skin every 14 (fourteen) days. 1st and the 15th of each month.   Yes Historical  Provider, MD  fluPHENAZine (PROLIXIN) 1 MG tablet Take 1 mg by mouth 2 (two) times daily.    Yes Historical Provider, MD  gabapentin (NEURONTIN) 300 MG capsule Take 300 mg by mouth daily.    Yes Historical Provider, MD  hydrALAZINE (APRESOLINE) 100 MG tablet Take 100 mg by mouth 2 (two) times daily.   Yes Historical Provider, MD  insulin aspart (NOVOLOG) 100 UNIT/ML injection Inject 7-15 Units into the skin 3 (three) times daily before meals. Sliding scale   Yes Historical Provider, MD  insulin detemir (LEVEMIR) 100 UNIT/ML injection Inject 0.12 mLs (12 Units total) into the skin at bedtime. Patient taking differently: Inject 30-40 Units into the skin See admin instructions. Inject 40 units every morning and 30 units every night. 11/22/14  Yes Bonnielee Haff, MD  isosorbide mononitrate (IMDUR) 60 MG 24 hr tablet Take 60 mg by mouth daily.   Yes Historical Provider, MD  pantoprazole (PROTONIX) 20 MG tablet Take 20 mg by mouth daily.   Yes Historical Provider, MD  QUEtiapine (SEROQUEL) 100 MG tablet Take 2 tablets (200 mg total) by mouth at bedtime. Patient taking differently: Take 400 mg by mouth at bedtime.  11/22/14  Yes Bonnielee Haff, MD  traMADol (ULTRAM) 50 MG tablet Take 1 tablet (50 mg total) by mouth every 6 (six) hours as needed. Maximum dose= 8 tablets per day Patient not taking: Reported on 11/29/2015 03/19/15   Newt Minion, MD   BP 141/66 mmHg  Pulse 77  Temp(Src) 98.9 F (37.2 C) (Oral)  Resp 18  Wt 181 lb 6.4 oz (82.283 kg)  SpO2 88% Physical Exam  Constitutional: She is oriented to person, place, and time. She appears well-developed and well-nourished. No distress.  HENT:  Head: Normocephalic and atraumatic.  Right Ear: External ear normal.  Left Ear: External ear normal.  Eyes: Conjunctivae and EOM are normal. Pupils are equal, round, and reactive to light.  Neck: Normal range of motion and phonation normal. Neck supple.  Cardiovascular: Normal rate, regular rhythm and  normal heart sounds.   Pulmonary/Chest: Effort normal. She exhibits no tenderness and no bony tenderness.  Rales three-quarter way up bilateral. Scattered wheezes. Fair air movement bilaterally. No increased work of breathing.  Abdominal: Soft. There is no tenderness.  Musculoskeletal: Normal range of motion.  2+ right lower extremity swelling, 1+ left lower extremity swelling.  Neurological: She is alert and oriented to person, place, and time. No cranial nerve deficit or sensory deficit. She exhibits normal muscle tone. Coordination normal.  Skin: Skin is warm, dry and intact.  Psychiatric: She has a normal mood and affect. Her behavior is normal. Judgment and thought content normal.  Nursing note and vitals reviewed.   ED Course  Procedures (including critical  care time) Medications - No data to display  Patient Vitals for the past 24 hrs:  BP Temp Temp src Pulse Resp SpO2 Weight  11/29/15 1957 - - - - - - 181 lb 6.4 oz (82.283 kg)  11/29/15 1814 141/66 mmHg 98.9 F (37.2 C) Oral 77 18 (!) 88 % -    8:01 PM Reevaluation with update and discussion. After initial assessment and treatment, an updated evaluation reveals No change in clinical status. Findings discussed with patient, all questions answered. Regan Llorente L    7:43 PM-Consult complete with hospitalist. Patient case explained and discussed. He agrees to admit patient for further evaluation and treatment. Call ended at 19:55  Labs Review Labs Reviewed  BASIC METABOLIC PANEL - Abnormal; Notable for the following:    CO2 19 (*)    Glucose, Bld 245 (*)    BUN 52 (*)    Creatinine, Ser 3.86 (*)    GFR calc non Af Amer 11 (*)    GFR calc Af Amer 13 (*)    All other components within normal limits  CBC - Abnormal; Notable for the following:    RBC 2.83 (*)    Hemoglobin 8.7 (*)    HCT 27.1 (*)    RDW 15.6 (*)    All other components within normal limits  I-STAT TROPOININ, ED    Imaging Review Dg Chest 2  View  11/29/2015  CLINICAL DATA:  Cough. Congestion. Shortness of breath, 2 weeks duration. EXAM: CHEST  2 VIEW COMPARISON:  01/20/2015 FINDINGS: Cardiomegaly. Mediastinal shadows are otherwise normal. Pulmonary venous hypertension with interstitial and early alveolar edema. Small effusions bilaterally. No acute bone finding. IMPRESSION: Congestive heart failure with cardiomegaly, venous hypertension, interstitial and early alveolar edema and small pleural effusions. Electronically Signed   By: Nelson Chimes M.D.   On: 11/29/2015 19:22   I have personally reviewed and evaluated these images and lab results as part of my medical decision-making.   EKG Interpretation   Date/Time:  Thursday November 29 2015 18:12:50 EDT Ventricular Rate:  77 PR Interval:    QRS Duration: 93 QT Interval:  446 QTC Calculation: 505 R Axis:   -42 Text Interpretation:  Sinus rhythm no STEMI, no significant change from  old. Confirmed by Johnney Killian, MD, Jeannie Done [phone]) on 11/29/2015 6:18:46 PM      MDM   Final diagnoses:  Acute on chronic diastolic congestive heart failure ([place])  Acute respiratory failure with hypoxemia (HCC)    Evaluation consistent with heart failure exacerbation. Chronic renal insufficiency, and hemoglobin lower than baseline. Doubt PE, pneumonia, or ACS.  Nursing Notes Reviewed/ Care Coordinated, and agree without changes. Applicable Imaging Reviewed.  Interpretation of Laboratory Data incorporated into ED treatment  Plan: Admit    Daleen Bo, MD 11/29/15 2006

## 2015-11-29 NOTE — H&P (Signed)
History and Physical    Robin [name] [id number] DOB: 23-Jun-1950 DOA: 11/29/2015  PCP: [facility]   Patient coming from: Home   Chief Complaint: Dyspnea, leg swelling  HPI: Robin [name] is a 65 y.o. female with medical history significant for chronic diastolic CHF, hypertension, bipolar disorder, chronic kidney disease stage IV, insulin-dependent diabetes mellitus, and GERD who presents the emergency department with 1 week of progressive dyspnea and leg swelling. Patient reports pain in her usual state of health until the insidious development of dyspnea approximately one week ago. She describes her symptoms as worse with exertion and better with rest. There is associated orthopnea and lower extremity swelling. Patient reports that her left lower extremity had been swollen several days ago, but now her right lower extremity is swollen. She denies any associated pain in the leg, or redness. There's been no prolonged immobilization, long distance travel, or personal or family history of VTE. Patient endorses some mild left-sided chest pain, described as burning, worse with activity, and better with rest. She denies palpitations or cough. Patient takes Bumex 4 mg twice daily and denies any recent change in her medications, diet, or fluid intake. She denies any fevers or chills, nausea or vomiting, melena or hematochezia.  ED Course: Upon arrival to the ED, patient is found to be afebrile, saturating 88% on room air, and with vitals otherwise stable. EKG demonstrates a sinus rhythm with no significant change from prior. Chest x-ray findings are consistent with CHF as manifest by cardiomegaly, venous congestion, interstitial edema, and early alveolar edema with small pleural effusions. BMP is notable for BUN of 52 and serum creatinine of 3.86 which appears consistent with her baseline. CBC features a hemoglobin of 8.7, down from 11.6 in February 2016. Troponin is within the normal limits at  0.03. Patient was placed on supplemental oxygen via nasal cannula with improvement in O2 saturations. She was monitored on telemetry in the emergency department with no apparent arrhythmias. She remained hemodynamically stable and will be observed on the telemetry unit for ongoing evaluation and management of acute on chronic diastolic CHF with acute hypoxic respiratory failure.  Review of Systems:  All other systems reviewed and apart from HPI, are negative.  Past Medical History  Diagnosis Date  . Coronary artery disease   . Bipolar disorder ([place])   . History of blood transfusion   . Benign hypertension   . Anemia   . Heart murmur   . CHF (congestive heart failure) ([place])   . Sleep apnea   . Shortness of breath dyspnea   . History of pneumonia   . History of bronchitis   . Depression   . Anxiety   . GERD (gastroesophageal reflux disease)   . Arthritis   . Diabetes mellitus without complication ([place])   . Type 2 diabetes mellitus ([place])   . Chronic kidney disease, stage V ([place])     Nephrologist is with [facility] (Dr. Tammi Klippel)    Past Surgical History  Procedure Laterality Date  . Cardiac catheterization      2 stent   . Av fistula placement Left may-2016    done at [facility]  . Tonsillectomy    . Abdominal hysterectomy    . Cholecystectomy    . Ankle closed reduction N/A 11/17/2014    Procedure: CLOSED REDUCTION ANKLE;  Surgeon: Earlie Server, MD;  Location: [place];  Service: Orthopedics;  Laterality: N/A;  . Orif ankle fracture Left 11/20/2014    Procedure:  OPEN REDUCTION INTERNAL FIXATION (ORIF) ANKLE FRACTURE;  Surgeon: Renette Butters, MD;  Location: [place];  Service: Orthopedics;  Laterality: Left;  . Coronary stent placement    . Hardware removal Left 03/16/2015    Procedure: Removal Hardware Left Ankle;  Surgeon: Newt Minion, MD;  Location: [place];  Service: Orthopedics;  Laterality: Left;  . Ankle fusion Left 03/16/2015    Procedure: Left Tibiocalcaneal Fusion;   Surgeon: Newt Minion, MD;  Location: [place];  Service: Orthopedics;  Laterality: Left;     reports that she has been smoking Cigarettes.  She has been smoking about 0.50 packs per day. She does not have any smokeless tobacco history on file. She reports that she does not drink alcohol or use illicit drugs.  Allergies  Allergen Reactions  . Oxycodone Other (See Comments)    Makes patient lethargic, sleeping for few days, altered mental status    History reviewed. No pertinent family history.   Prior to Admission medications   Medication Sig Start Date End Date Taking? Authorizing Provider  albuterol (PROVENTIL HFA;VENTOLIN HFA) 108 (90 BASE) MCG/ACT inhaler Inhale 1-2 puffs into the lungs every 6 (six) hours as needed for wheezing or shortness of breath.   Yes Historical Provider, MD  amLODipine (NORVASC) 10 MG tablet Take 10 mg by mouth daily.   Yes Historical Provider, MD  aspirin EC 81 MG tablet Take 81 mg by mouth daily.   Yes Historical Provider, MD  atorvastatin (LIPITOR) 80 MG tablet Take 40 mg by mouth every evening.   Yes Historical Provider, MD  benztropine (COGENTIN) 0.5 MG tablet Take 0.5 mg by mouth 2 (two) times daily.   Yes Historical Provider, MD  bisacodyl (BISACODYL) 5 MG EC tablet Take 5 mg by mouth daily as needed for moderate constipation.   Yes Historical Provider, MD  bumetanide (BUMEX) 2 MG tablet Take 4 mg by mouth 2 (two) times daily.    Yes Historical Provider, MD  carvedilol (COREG) 25 MG tablet Take 25 mg by mouth 2 (two) times daily with a meal.   Yes Historical Provider, MD  darbepoetin (ARANESP) 40 MCG/0.4ML SOLN injection Inject 40 mcg into the skin every 14 (fourteen) days. 1st and the 15th of each month.   Yes Historical Provider, MD  fluPHENAZine (PROLIXIN) 1 MG tablet Take 1 mg by mouth 2 (two) times daily.    Yes Historical Provider, MD  gabapentin (NEURONTIN) 300 MG capsule Take 300 mg by mouth daily.    Yes Historical Provider, MD  hydrALAZINE  (APRESOLINE) 100 MG tablet Take 100 mg by mouth 2 (two) times daily.   Yes Historical Provider, MD  insulin aspart (NOVOLOG) 100 UNIT/ML injection Inject 7-15 Units into the skin 3 (three) times daily before meals. Sliding scale   Yes Historical Provider, MD  insulin detemir (LEVEMIR) 100 UNIT/ML injection Inject 0.12 mLs (12 Units total) into the skin at bedtime. Patient taking differently: Inject 30-40 Units into the skin See admin instructions. Inject 40 units every morning and 30 units every night. 11/22/14  Yes Bonnielee Haff, MD  isosorbide mononitrate (IMDUR) 60 MG 24 hr tablet Take 60 mg by mouth daily.   Yes Historical Provider, MD  pantoprazole (PROTONIX) 20 MG tablet Take 20 mg by mouth daily.   Yes Historical Provider, MD  QUEtiapine (SEROQUEL) 100 MG tablet Take 2 tablets (200 mg total) by mouth at bedtime. Patient taking differently: Take 400 mg by mouth at bedtime.  11/22/14  Yes Bonnielee Haff,  MD  traMADol (ULTRAM) 50 MG tablet Take 1 tablet (50 mg total) by mouth every 6 (six) hours as needed. Maximum dose= 8 tablets per day Patient not taking: Reported on 11/29/2015 03/19/15   Newt Minion, MD    Physical Exam: Filed Vitals:   11/29/15 1814 11/29/15 1957  BP: 141/66   Pulse: 77   Temp: 98.9 F (37.2 C)   TempSrc: Oral   Resp: 18   Weight:  82.283 kg (181 lb 6.4 oz)  SpO2: 88%       Constitutional: NAD, calm, comfortable, nasal canula in place  Eyes: PERTLA, lids and conjunctivae normal ENMT: Mucous membranes are moist. Posterior pharynx clear of any exudate or lesions.   Neck: normal, supple, no masses, no thyromegaly Respiratory: Crackles appreciated bilaterally. Normal respiratory effort. No accessory muscle use.  Cardiovascular: S1 & S2 heard, regular rate and rhythm, S3 gallop. RLE edematous to thigh, LLE with trace pitting edema. No carotid bruits.  Abdomen: No distension, no tenderness, no masses palpated. Bowel sounds normal.  Musculoskeletal: no clubbing /  cyanosis. No joint deformity upper and lower extremities. Normal muscle tone.  Skin: no significant rashes, lesions, ulcers. Warm, dry, well-perfused. Neurologic: CN 2-12 grossly intact. Sensation intact, DTR normal. Strength 5/5 in all 4 limbs.  Psychiatric: Normal judgment and insight. Alert and oriented x 3. Normal mood and affect.     Labs on Admission: I have personally reviewed following labs and imaging studies  CBC:  Recent Labs Lab 11/29/15 1837  WBC 7.6  HGB 8.7*  HCT 27.1*  MCV 95.8  PLT Q000111Q   Basic Metabolic Panel:  Recent Labs Lab 11/29/15 1837  NA 138  K 4.2  CL 110  CO2 19*  GLUCOSE 245*  BUN 52*  CREATININE 3.86*  CALCIUM 8.9   GFR: CrCl cannot be calculated (Unknown ideal weight.). Liver Function Tests: No results for input(s): AST, ALT, ALKPHOS, BILITOT, PROT, ALBUMIN in the last 168 hours. No results for input(s): LIPASE, AMYLASE in the last 168 hours. No results for input(s): AMMONIA in the last 168 hours. Coagulation Profile: No results for input(s): INR, PROTIME in the last 168 hours. Cardiac Enzymes: No results for input(s): CKTOTAL, CKMB, CKMBINDEX, TROPONINI in the last 168 hours. BNP (last 3 results) No results for input(s): PROBNP in the last 8760 hours. HbA1C: No results for input(s): HGBA1C in the last 72 hours. CBG: No results for input(s): GLUCAP in the last 168 hours. Lipid Profile: No results for input(s): CHOL, HDL, LDLCALC, TRIG, CHOLHDL, LDLDIRECT in the last 72 hours. Thyroid Function Tests: No results for input(s): TSH, T4TOTAL, FREET4, T3FREE, THYROIDAB in the last 72 hours. Anemia Panel: No results for input(s): VITAMINB12, FOLATE, FERRITIN, TIBC, IRON, RETICCTPCT in the last 72 hours. Urine analysis:    Component Value Date/Time   COLORURINE YELLOW 01/21/2015 0416   APPEARANCEUR CLEAR 01/21/2015 0416   LABSPEC 1.015 01/21/2015 0416   PHURINE 5.0 01/21/2015 0416   GLUCOSEU NEGATIVE 01/21/2015 0416   HGBUR NEGATIVE  01/21/2015 0416   BILIRUBINUR NEGATIVE 01/21/2015 0416   KETONESUR NEGATIVE 01/21/2015 0416   PROTEINUR 100* 01/21/2015 0416   UROBILINOGEN 0.2 01/21/2015 0416   NITRITE NEGATIVE 01/21/2015 0416   LEUKOCYTESUR NEGATIVE 01/21/2015 0416   Sepsis Labs: @LABRCNTIP (procalcitonin:4,lacticidven:4) )No results found for this or any previous visit (from the past 240 hour(s)).   Radiological Exams on Admission: Dg Chest 2 View  11/29/2015  CLINICAL DATA:  Cough. Congestion. Shortness of breath, 2 weeks duration. EXAM: CHEST  2 VIEW COMPARISON:  01/20/2015 FINDINGS: Cardiomegaly. Mediastinal shadows are otherwise normal. Pulmonary venous hypertension with interstitial and early alveolar edema. Small effusions bilaterally. No acute bone finding. IMPRESSION: Congestive heart failure with cardiomegaly, venous hypertension, interstitial and early alveolar edema and small pleural effusions. Electronically Signed   By: Nelson Chimes M.D.   On: 11/29/2015 19:22    EKG: Independently reviewed. Sinus rhythm, not significantly changed from prior   Assessment/Plan  1. Acute on chronic diastolic CHF  - Pt presents dyspneic with LE edema, S3 gallop, and edema on CXR  - TTE (11/18/14) with EF 0000000, grade 2 diastolic dysfunction, mild LAE  - No recent wts on file - Managed with Bumex 4mg  BID and Coreg at home  - Hold Coreg until better compensated; no ACE-i given renal insufficiency  - Diurese with 60 mg IV Lasix q12h, adjusted prn  - Fluid-restrict diet, SLIV, follow daily wts and strict I/O's  - Monitor renal fxn and lytes with daily BMP  - Monitor on telemetry  - Update TTE, ordered  2. Acute hypoxic respiratory failure  - Suspected secondary to acute on chronic diastolic CHF as above  - Saturating 88% on rm air initially, has normalized with 2 Lpm  - Anticipate resolution with fluid-removal as above  - Wean supplemental O2 as tolerated    3. CKD stage IV  - SCr 3.86 on admission, appears consistent  with her baseline  - Followed by nephrology through the [facility] and has mature AVF that has never been used  - This may complicate diuresis; will follow daily BMP's  - Hopefully, renal perfusion will improve if we get her hemodynamics back onto the Frank-Starling curve with diuresis   - Avoiding nephrotoxins, renally-dosing medications   4. Chest pain, hx of CAD  - Left-sided exertional CP reported, described as mild and "burning"  - Initial troponin wnl at 0.03; EKG with no acute ischemic features  - Suspect this is secondary to acute CHF rather than ACS  - ASA 324 mg given, will monitor on telemetry for ischemic changes, cycle troponins  - Continue daily ASA 81, Lipitor, Imdur; resume Coreg once CHF better compensated   5. Hypertension  - At goal currently  - Managed with Coreg, Norvasc, hydralazine at home  - Continue Norvasc and hydralazine, resume Coreg once CHF better compensated   6. Type II DM  - A1c 6.4% in August 2016  - Managed with Lantus 40 units qAM and 30 units qPM, and Novolog per sliding-scale  - Check CBG's ACHS  - Continue basal coverage with Lantus 30 units BID, start meal-time coverage with 4 units Humalog, plus sliding-scale correctional  - Carb-modified diet    7. Bipolar disorder  - Stable  - Continue current management with Prolixin, Seroquel, and Cogentin    8. RLE edema  - Suspected secondary to CHF  - Rule-out DVT with venous doppler    9. Normocytic anemia  - Hgb 8.7 on admission, down from 11.6 in February 2016, but had been in 8's prior to that  - No s/s of active bleed; suspect some of the drop is secondary to hemodilution in setting of acute CHF    DVT prophylaxis: sq heparin  Code Status: Full  Family Communication: Family updated at bedside  Disposition Plan: Observe on telemetry  Consults called: None Admission status: Observation    Vianne Bulls, MD [facility] Pager [phone]  If 7PM-7AM, please contact  night-coverage [email or web address] Password [email or web address]  11/29/2015, 8:50 PM

## 2015-11-29 NOTE — ED Notes (Signed)
Hx of COPD, has had SOB over the last week. Chest pain on left side that "feels like heartburn" over the last week. Right leg is considerably more swollen than left leg, from groin area to foot that patient states is new since Tuesday. Takes a baby aspirin daily at home. Denies weakness, back pain, N/V, excessive sweating.

## 2015-11-30 ENCOUNTER — Observation Stay (HOSPITAL_COMMUNITY): Payer: Medicare Other

## 2015-11-30 ENCOUNTER — Observation Stay (HOSPITAL_BASED_OUTPATIENT_CLINIC_OR_DEPARTMENT_OTHER): Payer: Medicare Other

## 2015-11-30 DIAGNOSIS — E1122 Type 2 diabetes mellitus with diabetic chronic kidney disease: Secondary | ICD-10-CM | POA: Diagnosis present

## 2015-11-30 DIAGNOSIS — N189 Chronic kidney disease, unspecified: Secondary | ICD-10-CM | POA: Diagnosis not present

## 2015-11-30 DIAGNOSIS — I509 Heart failure, unspecified: Secondary | ICD-10-CM | POA: Diagnosis not present

## 2015-11-30 DIAGNOSIS — I82411 Acute embolism and thrombosis of right femoral vein: Secondary | ICD-10-CM | POA: Diagnosis present

## 2015-11-30 DIAGNOSIS — I130 Hypertensive heart and chronic kidney disease with heart failure and stage 1 through stage 4 chronic kidney disease, or unspecified chronic kidney disease: Secondary | ICD-10-CM | POA: Diagnosis present

## 2015-11-30 DIAGNOSIS — F3170 Bipolar disorder, currently in remission, most recent episode unspecified: Secondary | ICD-10-CM | POA: Diagnosis not present

## 2015-11-30 DIAGNOSIS — Z794 Long term (current) use of insulin: Secondary | ICD-10-CM | POA: Diagnosis not present

## 2015-11-30 DIAGNOSIS — R0600 Dyspnea, unspecified: Secondary | ICD-10-CM | POA: Diagnosis not present

## 2015-11-30 DIAGNOSIS — N184 Chronic kidney disease, stage 4 (severe): Secondary | ICD-10-CM | POA: Diagnosis not present

## 2015-11-30 DIAGNOSIS — K219 Gastro-esophageal reflux disease without esophagitis: Secondary | ICD-10-CM | POA: Diagnosis present

## 2015-11-30 DIAGNOSIS — I5033 Acute on chronic diastolic (congestive) heart failure: Secondary | ICD-10-CM | POA: Diagnosis not present

## 2015-11-30 DIAGNOSIS — F319 Bipolar disorder, unspecified: Secondary | ICD-10-CM | POA: Diagnosis present

## 2015-11-30 DIAGNOSIS — I5043 Acute on chronic combined systolic (congestive) and diastolic (congestive) heart failure: Secondary | ICD-10-CM | POA: Diagnosis not present

## 2015-11-30 DIAGNOSIS — M7989 Other specified soft tissue disorders: Secondary | ICD-10-CM

## 2015-11-30 DIAGNOSIS — G4730 Sleep apnea, unspecified: Secondary | ICD-10-CM | POA: Diagnosis not present

## 2015-11-30 DIAGNOSIS — J9601 Acute respiratory failure with hypoxia: Secondary | ICD-10-CM | POA: Diagnosis not present

## 2015-11-30 DIAGNOSIS — I2510 Atherosclerotic heart disease of native coronary artery without angina pectoris: Secondary | ICD-10-CM | POA: Diagnosis present

## 2015-11-30 DIAGNOSIS — I5023 Acute on chronic systolic (congestive) heart failure: Secondary | ICD-10-CM | POA: Diagnosis not present

## 2015-11-30 DIAGNOSIS — I10 Essential (primary) hypertension: Secondary | ICD-10-CM | POA: Diagnosis not present

## 2015-11-30 DIAGNOSIS — D631 Anemia in chronic kidney disease: Secondary | ICD-10-CM | POA: Diagnosis present

## 2015-11-30 DIAGNOSIS — E1165 Type 2 diabetes mellitus with hyperglycemia: Secondary | ICD-10-CM | POA: Diagnosis not present

## 2015-11-30 LAB — GLUCOSE, CAPILLARY
GLUCOSE-CAPILLARY: 102 mg/dL — AB (ref 65–99)
GLUCOSE-CAPILLARY: 93 mg/dL (ref 65–99)
GLUCOSE-CAPILLARY: 101 mg/dL — AB (ref 65–99)
Glucose-Capillary: 179 mg/dL — ABNORMAL HIGH (ref 65–99)

## 2015-11-30 LAB — TROPONIN I
TROPONIN I: 0.03 ng/mL — AB (ref <0.03)
Troponin I: 0.04 ng/mL (ref <0.03)

## 2015-11-30 LAB — BASIC METABOLIC PANEL
Anion gap: 7 (ref 5–15)
BUN: 53 mg/dL — AB (ref 6–20)
CALCIUM: 9.1 mg/dL (ref 8.9–10.3)
CO2: 21 mmol/L — ABNORMAL LOW (ref 22–32)
CREATININE: 3.9 mg/dL — AB (ref 0.44–1.00)
Chloride: 111 mmol/L (ref 101–111)
GFR calc Af Amer: 13 mL/min — ABNORMAL LOW (ref >60)
GFR calc non Af Amer: 11 mL/min — ABNORMAL LOW (ref >60)
Glucose, Bld: 253 mg/dL — ABNORMAL HIGH (ref 65–99)
Potassium: 4.1 mmol/L (ref 3.5–5.1)
SODIUM: 139 mmol/L (ref 135–145)

## 2015-11-30 LAB — ECHOCARDIOGRAM COMPLETE
Height: 64 in
WEIGHTICAEL: 2897.6 [oz_av]

## 2015-11-30 LAB — APTT: aPTT: 34 seconds (ref 24–37)

## 2015-11-30 LAB — PROTIME-INR
INR: 1.1 (ref 0.00–1.49)
PROTHROMBIN TIME: 14.4 s (ref 11.6–15.2)

## 2015-11-30 MED ORDER — PATIENT'S GUIDE TO USING COUMADIN BOOK
Freq: Once | Status: AC
  Administered 2015-11-30: 1
  Filled 2015-11-30: qty 1

## 2015-11-30 MED ORDER — WARFARIN SODIUM 5 MG PO TABS
7.5000 mg | ORAL_TABLET | Freq: Once | ORAL | Status: AC
  Administered 2015-11-30: 7.5 mg via ORAL
  Filled 2015-11-30: qty 1

## 2015-11-30 MED ORDER — WARFARIN - PHARMACIST DOSING INPATIENT
Freq: Every day | Status: DC
  Administered 2015-12-02 – 2015-12-04 (×2)

## 2015-11-30 MED ORDER — SENNOSIDES-DOCUSATE SODIUM 8.6-50 MG PO TABS
1.0000 | ORAL_TABLET | Freq: Two times a day (BID) | ORAL | Status: DC
  Administered 2015-11-30 – 2015-12-05 (×10): 1 via ORAL
  Filled 2015-11-30 (×10): qty 1

## 2015-11-30 MED ORDER — WARFARIN VIDEO
Freq: Once | Status: AC
  Administered 2015-12-01: 1

## 2015-11-30 MED ORDER — HEPARIN (PORCINE) IN NACL 100-0.45 UNIT/ML-% IJ SOLN
1200.0000 [IU]/h | INTRAMUSCULAR | Status: DC
  Administered 2015-11-30 – 2015-12-04 (×4): 1200 [IU]/h via INTRAVENOUS
  Filled 2015-11-30 (×6): qty 250

## 2015-11-30 NOTE — Progress Notes (Signed)
PROGRESS NOTE    Robin [name]  [id number] DOB: [birth year] DOA: 11/29/2015 PCP: [place]    Brief Narrative:  65 y.o. female with medical history significant for chronic diastolic CHF, hypertension, bipolar disorder, chronic kidney disease stage IV, insulin-dependent diabetes mellitus, and GERD who presents the emergency department with 1 week of progressive dyspnea and leg swelling. Patient reports pain in her usual state of health until the insidious development of dyspnea approximately one week prior to admission. In ED, patient was noted to be afebrile with O2 sats of around 88% on room air. Chest x-rays were notable for findings of CHF. Patient was admitted for further workup of acute on chronic diastolic congestive heart failure.   Assessment & Plan:   Principal Problem:   Acute on chronic diastolic heart failure (HCC) Active Problems:   DM type 2, uncontrolled, with renal complications (HCC)   Essential hypertension, benign   CAD (coronary artery disease)   Anemia in chronic kidney disease   Bipolar affective disorder (HCC)   Chronic renal disease, stage 4, severely decreased glomerular filtration rate between 15-29 mL/min/1.73 square meter (HCC)   Acute respiratory failure with hypoxia (HCC)   CHF exacerbation (HCC)   Right leg swelling   Acute on chronic combined systolic and diastolic CHF (congestive heart failure) (HCC)   Acute respiratory failure with hypoxemia (HCC)   Insulin dependent diabetes mellitus ([place])   1. Acute on chronic diastolic CHF  - Pt presents dyspneic with LE edema, S3 gallop, and edema on CXR  - TTE (11/18/14) with EF 0000000, grade 2 diastolic dysfunction, mild LAE  - Managed with Bumex 4mg  BID and Coreg at home  - Hold Coreg until better compensated; no ACE-i given renal insufficiency  - Continue to diurese with 60 mg IV Lasix q12h, adjusted prn  - Fluid-restrict diet, SLIV, follow daily wts and strict I/O's  - Monitor renal  fxn and lytes with daily BMP - 2-D echo performed, pending results   2. Acute hypoxic respiratory failure  - Suspected secondary to acute on chronic diastolic CHF as above  - Saturating 88% on rm air initially, has normalized with 2 Lpm  - Anticipate resolution with fluid-removal as above  - Wean supplemental O2 as tolerated   3. CKD stage IV  - SCr 3.86 on admission, appears consistent with her baseline  - Followed by nephrology through the [facility] and has mature AVF that has never been used  - Avoiding nephrotoxins, renally-dosing medications   4. Chest pain, hx of CAD  - Left-sided exertional CP reported, described as mild and "burning"  - Initial troponin wnl at 0.03; EKG with no acute ischemic features  - ASA 324 mg given, will monitor on telemetry for ischemic changes, cycle troponins  - Continue daily ASA 81, Lipitor, Imdur; plan to resume Coreg once CHF better compensated   5. Hypertension  - Managed with Coreg, Norvasc, hydralazine at home  - Continue Norvasc and hydralazine, resume Coreg once CHF better compensated   6. Type II DM  - A1c 6.4% in August 2016  - Managed with Lantus 40 units qAM and 30 units qPM, and Novolog per sliding-scale  - Check CBG's ACHS  - Continue basal coverage with Lantus 30 units BID, start meal-time coverage with 4 units Humalog, plus sliding-scale correctional  - Carb-modified diet   7. Bipolar disorder  - Stable  - Continue current management with Prolixin, Seroquel, and Cogentin   8. RLE edema  - Suspected  secondary to CHF  - Rule-out DVT with venous doppler   9. Normocytic anemia  - Hgb 8.7 on admission, down from 11.6 in February 2016, but had been in 8's prior to that  - No s/s of active bleed; suspect some of the drop is secondary to hemodilution in setting of acute CHF  10. Unilateral right lower extremity swelling - Lower extremity Dopplers confirm DVT involving a small segment of the right distal  femoral vein. - Patient's chronic kidney disease makes choosing long-term anticoagulation challenging. - For now, we'll transition patient to therapeutic Lovenox, pharmacy to dose.   DVT prophylaxis: Therapeutic Lovenox Code Status: Full Family Communication: Patient in room, family at bedside Disposition Plan: Uncertain at this time   Consultants:     Procedures:     Antimicrobials:  Antibiotics Given (last 72 hours)    None           Subjective: No complaints today.  Objective: Filed Vitals:   11/29/15 1957 11/29/15 2115 11/30/15 0513 11/30/15 1425  BP:  164/65 137/72 131/81  Pulse:  76 81 88  Temp:  98.5 F (36.9 C) 99.3 F (37.4 C) 97.9 F (36.6 C)  TempSrc:  Oral Oral Axillary  Resp:  18 18 16   Height:  5\' 4"  (1.626 m)    Weight: 82.283 kg (181 lb 6.4 oz) 82.146 kg (181 lb 1.6 oz)    SpO2:  90% 95% 98%    Intake/Output Summary (Last 24 hours) at 11/30/15 1535 Last data filed at 11/30/15 1425  Gross per 24 hour  Intake      0 ml  Output    800 ml  Net   -800 ml   Filed Weights   11/29/15 1957 11/29/15 2115  Weight: 82.283 kg (181 lb 6.4 oz) 82.146 kg (181 lb 1.6 oz)    Examination:  General exam: Appears calm and comfortable, Lying in bed  Respiratory system: Clear to auscultation. Respiratory effort normal. Cardiovascular system: S1 & S2 heard, RRR Gastrointestinal system: Abdomen is nondistended, soft and nontender. No organomegaly or masses felt. Normal bowel sounds heard. Central nervous system: Alert and oriented. No focal neurological deficits. Extremities: Symmetric 5 x 5 power Skin: No rashes, lesions or ulcers Psychiatry: Judgement and insight appear normal. Mood & affect appropriate.     Data Reviewed: I have personally reviewed following labs and imaging studies  CBC:  Recent Labs Lab 11/29/15 1837  WBC 7.6  HGB 8.7*  HCT 27.1*  MCV 95.8  PLT Q000111Q   Basic Metabolic Panel:  Recent Labs Lab 11/29/15 1837  11/30/15 0232  NA 138 139  K 4.2 4.1  CL 110 111  CO2 19* 21*  GLUCOSE 245* 253*  BUN 52* 53*  CREATININE 3.86* 3.90*  CALCIUM 8.9 9.1   GFR: Estimated Creatinine Clearance: 15.3 mL/min (by C-G formula based on Cr of 3.9). Liver Function Tests: No results for input(s): AST, ALT, ALKPHOS, BILITOT, PROT, ALBUMIN in the last 168 hours. No results for input(s): LIPASE, AMYLASE in the last 168 hours. No results for input(s): AMMONIA in the last 168 hours. Coagulation Profile: No results for input(s): INR, PROTIME in the last 168 hours. Cardiac Enzymes:  Recent Labs Lab 11/29/15 2126 11/30/15 0232 11/30/15 0854  TROPONINI 0.03* 0.04* 0.03*   BNP (last 3 results) No results for input(s): PROBNP in the last 8760 hours. HbA1C: No results for input(s): HGBA1C in the last 72 hours. CBG:  Recent Labs Lab 11/29/15 2122 11/30/15 0757 11/30/15 1150  GLUCAP 192* 179* 102*   Lipid Profile: No results for input(s): CHOL, HDL, LDLCALC, TRIG, CHOLHDL, LDLDIRECT in the last 72 hours. Thyroid Function Tests: No results for input(s): TSH, T4TOTAL, FREET4, T3FREE, THYROIDAB in the last 72 hours. Anemia Panel: No results for input(s): VITAMINB12, FOLATE, FERRITIN, TIBC, IRON, RETICCTPCT in the last 72 hours. Sepsis Labs: No results for input(s): PROCALCITON, LATICACIDVEN in the last 168 hours.  No results found for this or any previous visit (from the past 240 hour(s)).       Radiology Studies: Dg Chest 2 View  11/29/2015  CLINICAL DATA:  Cough. Congestion. Shortness of breath, 2 weeks duration. EXAM: CHEST  2 VIEW COMPARISON:  01/20/2015 FINDINGS: Cardiomegaly. Mediastinal shadows are otherwise normal. Pulmonary venous hypertension with interstitial and early alveolar edema. Small effusions bilaterally. No acute bone finding. IMPRESSION: Congestive heart failure with cardiomegaly, venous hypertension, interstitial and early alveolar edema and small pleural effusions. Electronically  Signed   By: Nelson Chimes M.D.   On: 11/29/2015 19:22        Scheduled Meds: . amLODipine  10 mg Oral Daily  . antiseptic oral rinse  7 mL Mouth Rinse q12n4p  . aspirin EC  81 mg Oral Daily  . atorvastatin  40 mg Oral QPM  . benztropine  0.5 mg Oral BID  . chlorhexidine  15 mL Mouth Rinse BID  . fluPHENAZine  1 mg Oral BID  . furosemide  60 mg Intravenous Q12H  . gabapentin  300 mg Oral Daily  . hydrALAZINE  100 mg Oral BID  . insulin aspart  0-15 Units Subcutaneous TID WC  . insulin aspart  4 Units Subcutaneous TID WC  . insulin glargine  30 Units Subcutaneous QHS  . isosorbide mononitrate  60 mg Oral Daily  . pantoprazole  20 mg Oral Daily  . QUEtiapine  400 mg Oral QHS  . sodium chloride flush  3 mL Intravenous Q12H   Continuous Infusions:       CHIU, Orpah Melter, MD [facility] Pager [phone]  If 7PM-7AM, please contact night-coverage [email or web address] Password [email or web address] 11/30/2015, 3:35 PM

## 2015-11-30 NOTE — Progress Notes (Signed)
PT Cancellation Note  Patient Details Name: Robin [name] MRN: [id number] DOB: January 25, 1951   Cancelled Treatment:    Reason Eval/Treat Not Completed: Medical issues which prohibited therapy. Noted pt to have positive LE DVT. According to our guidelines, we will need to hold therapy today and check back with patient tomorrow after anticoagulants initiated and in range.   Thank you,    Clide Dales 11/30/2015, 3:05 PM  Clide Dales, PT Pager: [phone] 11/30/2015

## 2015-11-30 NOTE — Progress Notes (Addendum)
*  Preliminary Results* Right lower extremity venous duplex completed. Right lower extremity is positive for acute deep vein thrombosis involving a small segment of the right distal femoral vein. There is no evidence of right Baker's cyst.  Preliminary results discussed with Joellen Jersey, RN and Dr. Wyline Copas.  11/30/2015 2:07 PM  Maudry Mayhew, RVT, RDCS, RDMS

## 2015-11-30 NOTE — Care Management Obs Status (Signed)
[place] NOTIFICATION   Patient Details  Name: Robin [name] MRN: [id number] Date of Birth: 1950-11-01   Medicare Observation Status Notification Given:  Yes    Purcell Mouton, RN 11/30/2015, 3:49 PM

## 2015-11-30 NOTE — Progress Notes (Signed)
  Echocardiogram 2D Echocardiogram has been performed.  Jennette Dubin 11/30/2015, 4:06 PM

## 2015-11-30 NOTE — Progress Notes (Addendum)
ANTICOAGULATION CONSULT NOTE - Initial Consult  Pharmacy Consult for heparin/warfarin Indication: DVT  Allergies  Allergen Reactions  . Oxycodone Other (See Comments)    Makes patient lethargic, sleeping for few days, altered mental status    Patient Measurements: Height: 5\' 4"  (162.6 cm) Weight: 181 lb 1.6 oz (82.146 kg) IBW/kg (Calculated) : 54.7 Heparin Dosing Weight: 73kg  Vital Signs: Temp: 97.9 F (36.6 C) (07/07 1425) Temp Source: Axillary (07/07 1425) BP: 131/81 mmHg (07/07 1425) Pulse Rate: 88 (07/07 1425)  Labs:  Recent Labs  11/29/15 1837 11/29/15 2126 11/30/15 0232 11/30/15 0854  HGB 8.7*  --   --   --   HCT 27.1*  --   --   --   PLT 234  --   --   --   CREATININE 3.86*  --  3.90*  --   TROPONINI  --  0.03* 0.04* 0.03*    Estimated Creatinine Clearance: 15.3 mL/min (by C-G formula based on Cr of 3.9).   Medical History: Past Medical History  Diagnosis Date  . Coronary artery disease   . Bipolar disorder ([place])   . History of blood transfusion   . Benign hypertension   . Anemia   . Heart murmur   . CHF (congestive heart failure) ([place])   . Sleep apnea   . Shortness of breath dyspnea   . History of pneumonia   . History of bronchitis   . Depression   . Anxiety   . GERD (gastroesophageal reflux disease)   . Arthritis   . Diabetes mellitus without complication ([place])   . Type 2 diabetes mellitus ([place])   . Chronic kidney disease, stage V ([place])     Nephrologist is with [facility] (Dr. Tammi Klippel)   Assessment: 31 [name] presents with heart failure exacerbation and R LE swelling, venous duplex of R LE reveals DVT. She has CKD with CrCl < 85ml/min. Pharmacy to dose heparin gtt and warfarin.  Not a good candidate for newer anticoagulants due to poor renal function.  Today, 11/30/2015  CBC: Hgb low, pltc WNL  Renal: SCr = 3.9  Heparin SQ - last dose at ~2pm  Goal of Therapy:  Heparin level 0.3-0.7 units/ml Monitor platelets by anticoagulation  protocol: Yes   Plan:  Day #1 of minimum 5 day overlap for acute VTE  Heparin (no bolus d/t recent heparin SQ dose) 1200 units/hr  Check heparin level in 8h  Check baseline PT/INR and aPTT  Warfarin 7.5mg  PO x 1 tonight  Daily CBC, heparin level, and INR  Monitor for bleeding  Warfarin book/video  Doreene Eland, PharmD, BCPS.   Pager: [phone] 11/30/2015 4:19 PM

## 2015-12-01 DIAGNOSIS — I5043 Acute on chronic combined systolic (congestive) and diastolic (congestive) heart failure: Secondary | ICD-10-CM

## 2015-12-01 LAB — BASIC METABOLIC PANEL
ANION GAP: 7 (ref 5–15)
BUN: 52 mg/dL — AB (ref 6–20)
CALCIUM: 8.8 mg/dL — AB (ref 8.9–10.3)
CO2: 22 mmol/L (ref 22–32)
Chloride: 110 mmol/L (ref 101–111)
Creatinine, Ser: 4.05 mg/dL — ABNORMAL HIGH (ref 0.44–1.00)
GFR calc Af Amer: 13 mL/min — ABNORMAL LOW (ref >60)
GFR, EST NON AFRICAN AMERICAN: 11 mL/min — AB (ref >60)
GLUCOSE: 78 mg/dL (ref 65–99)
Potassium: 3.6 mmol/L (ref 3.5–5.1)
SODIUM: 139 mmol/L (ref 135–145)

## 2015-12-01 LAB — GLUCOSE, CAPILLARY
GLUCOSE-CAPILLARY: 53 mg/dL — AB (ref 65–99)
GLUCOSE-CAPILLARY: 81 mg/dL (ref 65–99)
GLUCOSE-CAPILLARY: 148 mg/dL — AB (ref 65–99)
Glucose-Capillary: 147 mg/dL — ABNORMAL HIGH (ref 65–99)
Glucose-Capillary: 68 mg/dL (ref 65–99)
Glucose-Capillary: 70 mg/dL (ref 65–99)
Glucose-Capillary: 150 mg/dL — ABNORMAL HIGH (ref 65–99)

## 2015-12-01 LAB — HEPARIN LEVEL (UNFRACTIONATED)
HEPARIN UNFRACTIONATED: 0.42 [IU]/mL (ref 0.30–0.70)
HEPARIN UNFRACTIONATED: 0.42 [IU]/mL (ref 0.30–0.70)

## 2015-12-01 LAB — CBC
HEMATOCRIT: 25.6 % — AB (ref 36.0–46.0)
Hemoglobin: 8.4 g/dL — ABNORMAL LOW (ref 12.0–15.0)
MCH: 31 pg (ref 26.0–34.0)
MCHC: 32.8 g/dL (ref 30.0–36.0)
MCV: 94.5 fL (ref 78.0–100.0)
PLATELETS: 228 10*3/uL (ref 150–400)
RBC: 2.71 MIL/uL — ABNORMAL LOW (ref 3.87–5.11)
RDW: 15.7 % — ABNORMAL HIGH (ref 11.5–15.5)
WBC: 6.4 10*3/uL (ref 4.0–10.5)

## 2015-12-01 LAB — PROTIME-INR
INR: 1.25 (ref 0.00–1.49)
Prothrombin Time: 15.9 seconds — ABNORMAL HIGH (ref 11.6–15.2)

## 2015-12-01 MED ORDER — WARFARIN SODIUM 7.5 MG PO TABS
7.5000 mg | ORAL_TABLET | Freq: Once | ORAL | Status: AC
  Administered 2015-12-01: 7.5 mg via ORAL
  Filled 2015-12-01: qty 1

## 2015-12-01 MED ORDER — INSULIN GLARGINE 100 UNIT/ML ~~LOC~~ SOLN
10.0000 [IU] | Freq: Every day | SUBCUTANEOUS | Status: DC
  Administered 2015-12-01 – 2015-12-04 (×4): 10 [IU] via SUBCUTANEOUS
  Filled 2015-12-01 (×4): qty 0.1

## 2015-12-01 MED ORDER — POLYETHYLENE GLYCOL 3350 17 G PO PACK
17.0000 g | PACK | Freq: Every day | ORAL | Status: AC
  Administered 2015-12-01 – 2015-12-02 (×2): 17 g via ORAL
  Filled 2015-12-01 (×2): qty 1

## 2015-12-01 MED ORDER — INSULIN GLARGINE 100 UNIT/ML ~~LOC~~ SOLN
20.0000 [IU] | Freq: Every day | SUBCUTANEOUS | Status: DC
  Filled 2015-12-01: qty 0.2

## 2015-12-01 NOTE — Progress Notes (Signed)
PROGRESS NOTE    Robin [name]  [id number] DOB: May 05, 1951 DOA: 11/29/2015 PCP: [place]    Brief Narrative:  65 y.o. female with medical history significant for chronic diastolic CHF, hypertension, bipolar disorder, chronic kidney disease stage IV, insulin-dependent diabetes mellitus, and GERD who presents the emergency department with 1 week of progressive dyspnea and leg swelling. Patient reports pain in her usual state of health until the insidious development of dyspnea approximately one week prior to admission. In ED, patient was noted to be afebrile with O2 sats of around 88% on room air. Chest x-rays were notable for findings of CHF. Patient was admitted for further workup of acute on chronic diastolic congestive heart failure.   Assessment & Plan:   Principal Problem:   Acute on chronic diastolic heart failure (HCC) Active Problems:   DM type 2, uncontrolled, with renal complications (HCC)   Essential hypertension, benign   CAD (coronary artery disease)   Anemia in chronic kidney disease   Bipolar affective disorder (HCC)   Chronic renal disease, stage 4, severely decreased glomerular filtration rate between 15-29 mL/min/1.73 square meter (HCC)   Acute respiratory failure with hypoxia (HCC)   CHF exacerbation (HCC)   Right leg swelling   Acute on chronic combined systolic and diastolic CHF (congestive heart failure) (HCC)   Acute respiratory failure with hypoxemia (HCC)   Insulin dependent diabetes mellitus ([place])   1. Acute on chronic diastolic CHF  - Pt presents dyspneic with LE edema, S3 gallop, and edema on CXR  - TTE (11/18/14) with EF 0000000, grade 2 diastolic dysfunction, mild LAE  - Managed with Bumex 4mg  BID and Coreg at home  - Holding Coreg until better compensated; no ACE-i given renal insufficiency  - Currently on 60 mg IV Lasix q12h with 1750cc out this AM - Cr has trended up to 4. Seems to be close to baseline. Pt still making good  urine.    2. Acute hypoxic respiratory failure  - Suspected secondary to acute on chronic diastolic CHF as above  - Now on RA - Resolved   3. CKD stage IV  - SCr 3.86 on admission, appears consistent with her baseline  - Followed by nephrology through the [name] and has mature AVF that has never been used  - Avoiding nephrotoxins, renally-dosing medications  - Follow renal function closely  4. Chest pain, hx of CAD  - Left-sided exertional CP reported, described as mild and "burning"  - Initial troponin wnl at 0.03; EKG with no acute ischemic features  - ASA 324 mg given, will monitor on telemetry for ischemic changes, cycle troponins  - Continue daily ASA 81, Lipitor, Imdur; plan to resume Coreg once CHF better compensated   5. Hypertension  - Managed with Coreg, Norvasc, hydralazine at home  - Continue Norvasc and hydralazine, resume Coreg once CHF better compensated   6. Type II DM  - A1c 6.4% in August 2016  - Managed with Lantus 40 units qAM and 30 units qPM, and Novolog per sliding-scale  - Check CBG's ACHS  - Continue basal coverage with Lantus 30 units BID, start meal-time coverage with 4 units Humalog, plus sliding-scale correctional  - Cont Carb-modified diet   7. Bipolar disorder  - Stable  - Continue current management with Prolixin, Seroquel, and Cogentin   8. RLE edema  - Suspected secondary to CHF with LE DVT (see below)  9. Normocytic anemia  - Hgb 8.7 on admission, down from 11.6 in  February 2016, but had been in 8's prior to that  - No s/s of active bleed; suspect some of the drop is secondary to hemodilution in setting of acute CHF  10. Unilateral right lower extremity swelling - Lower extremity Dopplers confirm DVT involving a small segment of the right distal femoral vein. - Patient's chronic kidney disease makes choosing long-term anticoagulation challenging. - Have started coumadin with heparin gtt - Follow daily INR   DVT  prophylaxis: Therapeutic heparin bridge Code Status: Full Family Communication: Patient in room, family at bedside Disposition Plan: Uncertain at this time   Consultants:     Procedures:     Antimicrobials:  Antibiotics Given (last 72 hours)    None          Subjective: Breathing better. Questioning about going home  Objective: Filed Vitals:   11/30/15 1425 12/01/15 0300 12/01/15 0359 12/01/15 1348  BP: 131/81 157/83  151/73  Pulse: 88 74  88  Temp: 97.9 F (36.6 C) 98.7 F (37.1 C)  98.3 F (36.8 C)  TempSrc: Axillary Oral  Oral  Resp: 16 18  20   Height:      Weight:   81.285 kg (179 lb 3.2 oz)   SpO2: 98% 92%  100%    Intake/Output Summary (Last 24 hours) at 12/01/15 1442 Last data filed at 12/01/15 1356  Gross per 24 hour  Intake  651.6 ml  Output   2350 ml  Net -1698.4 ml   Filed Weights   11/29/15 1957 11/29/15 2115 12/01/15 0359  Weight: 82.283 kg (181 lb 6.4 oz) 82.146 kg (181 lb 1.6 oz) 81.285 kg (179 lb 3.2 oz)    Examination:  General exam: Appears calm and comfortable, Sitting in chair Respiratory system: Clear to auscultation. Respiratory effort normal. Cardiovascular system: S1 & S2 heard, RRR Gastrointestinal system: Abdomen is nondistended, soft and nontender. No organomegaly or masses felt. Normal bowel sounds heard. Central nervous system: Alert and oriented. No focal neurological deficits. Extremities: Symmetric 5 x 5 power, RLE edematous Skin: No rashes, lesions or ulcers Psychiatry: Judgement and insight appear normal. Mood & affect appropriate.     Data Reviewed: I have personally reviewed following labs and imaging studies  CBC:  Recent Labs Lab 11/29/15 1837 12/01/15 0201  WBC 7.6 6.4  HGB 8.7* 8.4*  HCT 27.1* 25.6*  MCV 95.8 94.5  PLT 234 [id number]   Basic Metabolic Panel:  Recent Labs Lab 11/29/15 1837 11/30/15 0232 12/01/15 0201  NA 138 139 139  K 4.2 4.1 3.6  CL 110 111 110  CO2 19* 21* 22  GLUCOSE 245*  253* 78  BUN 52* 53* 52*  CREATININE 3.86* 3.90* 4.05*  CALCIUM 8.9 9.1 8.8*   GFR: Estimated Creatinine Clearance: 14.7 mL/min (by C-G formula based on Cr of 4.05). Liver Function Tests: No results for input(s): AST, ALT, ALKPHOS, BILITOT, PROT, ALBUMIN in the last 168 hours. No results for input(s): LIPASE, AMYLASE in the last 168 hours. No results for input(s): AMMONIA in the last 168 hours. Coagulation Profile:  Recent Labs Lab 11/30/15 1625 12/01/15 0201  INR 1.10 1.25   Cardiac Enzymes:  Recent Labs Lab 11/29/15 2126 11/30/15 0232 11/30/15 0854  TROPONINI 0.03* 0.04* 0.03*   BNP (last 3 results) No results for input(s): PROBNP in the last 8760 hours. HbA1C: No results for input(s): HGBA1C in the last 72 hours. CBG:  Recent Labs Lab 11/30/15 1648 11/30/15 2059 12/01/15 0727 12/01/15 0747 12/01/15 1152  GLUCAP 93 101* 53*  81 147*   Lipid Profile: No results for input(s): CHOL, HDL, LDLCALC, TRIG, CHOLHDL, LDLDIRECT in the last 72 hours. Thyroid Function Tests: No results for input(s): TSH, T4TOTAL, FREET4, T3FREE, THYROIDAB in the last 72 hours. Anemia Panel: No results for input(s): VITAMINB12, FOLATE, FERRITIN, TIBC, IRON, RETICCTPCT in the last 72 hours. Sepsis Labs: No results for input(s): PROCALCITON, LATICACIDVEN in the last 168 hours.  No results found for this or any previous visit (from the past 240 hour(s)).       Radiology Studies: Dg Chest 2 View  11/29/2015  CLINICAL DATA:  Cough. Congestion. Shortness of breath, 2 weeks duration. EXAM: CHEST  2 VIEW COMPARISON:  01/20/2015 FINDINGS: Cardiomegaly. Mediastinal shadows are otherwise normal. Pulmonary venous hypertension with interstitial and early alveolar edema. Small effusions bilaterally. No acute bone finding. IMPRESSION: Congestive heart failure with cardiomegaly, venous hypertension, interstitial and early alveolar edema and small pleural effusions. Electronically Signed   By: Nelson Chimes M.D.   On: 11/29/2015 19:22        Scheduled Meds: . amLODipine  10 mg Oral Daily  . antiseptic oral rinse  7 mL Mouth Rinse q12n4p  . aspirin EC  81 mg Oral Daily  . atorvastatin  40 mg Oral QPM  . benztropine  0.5 mg Oral BID  . chlorhexidine  15 mL Mouth Rinse BID  . fluPHENAZine  1 mg Oral BID  . furosemide  60 mg Intravenous Q12H  . gabapentin  300 mg Oral Daily  . hydrALAZINE  100 mg Oral BID  . insulin aspart  0-15 Units Subcutaneous TID WC  . insulin aspart  4 Units Subcutaneous TID WC  . insulin glargine  20 Units Subcutaneous QHS  . isosorbide mononitrate  60 mg Oral Daily  . pantoprazole  20 mg Oral Daily  . QUEtiapine  400 mg Oral QHS  . senna-docusate  1 tablet Oral BID  . sodium chloride flush  3 mL Intravenous Q12H  . warfarin  7.5 mg Oral ONCE-1800  . Warfarin - Pharmacist Dosing Inpatient   Does not apply q1800   Continuous Infusions: . heparin 1,200 Units/hr (12/01/15 1200)     LOS: 1 day    Shayma Pfefferle, Orpah Melter, MD [facility] Pager [phone]  If 7PM-7AM, please contact night-coverage [email or web address] Password [email or web address] 12/01/2015, 2:42 PM

## 2015-12-01 NOTE — Progress Notes (Signed)
Hypoglycemic Event  CBG: 68  Treatment: 15 GM carbohydrate snack  Symptoms: None  Follow-up CBG: Time:1754 CBG Result:70  Possible Reasons for Event: Inadequate meal intake  Comments/MD notified:Dr. Wyline Copas notified    Cathie Hoops

## 2015-12-01 NOTE — Progress Notes (Signed)
Hypoglycemic Event  CBG: 53  Treatment: 15 GM carbohydrate snack  Symptoms: None  Follow-up CBG: [id number] CBG Result:81  Possible Reasons for Event: Inadequate meal intake  Comments/MD notified:Dr. Wyline Copas notified     Cathie Hoops

## 2015-12-01 NOTE — Progress Notes (Signed)
ANTICOAGULATION CONSULT NOTE - Follow-Up  Pharmacy Consult for heparin/warfarin Indication: DVT  Allergies  Allergen Reactions  . Oxycodone Other (See Comments)    Makes patient lethargic, sleeping for few days, altered mental status    Patient Measurements: Height: 5\' 4"  (162.6 cm) Weight: 181 lb 1.6 oz (82.146 kg) IBW/kg (Calculated) : 54.7 Heparin Dosing Weight: 73kg  Vital Signs:    Labs:  Recent Labs  11/29/15 1837 11/29/15 2126 11/30/15 0232 11/30/15 0854 11/30/15 1625 12/01/15 0201  HGB 8.7*  --   --   --   --  8.4*  HCT 27.1*  --   --   --   --  25.6*  PLT 234  --   --   --   --  228  APTT  --   --   --   --  34  --   LABPROT  --   --   --   --  14.4 15.9*  INR  --   --   --   --  1.10 1.25  HEPARINUNFRC  --   --   --   --   --  0.42  CREATININE 3.86*  --  3.90*  --   --  4.05*  TROPONINI  --  0.03* 0.04* 0.03*  --   --     Estimated Creatinine Clearance: 14.7 mL/min (by C-G formula based on Cr of 4.05).   Medical History: Past Medical History  Diagnosis Date  . Coronary artery disease   . Bipolar disorder ([place])   . History of blood transfusion   . Benign hypertension   . Anemia   . Heart murmur   . CHF (congestive heart failure) ([place])   . Sleep apnea   . Shortness of breath dyspnea   . History of pneumonia   . History of bronchitis   . Depression   . Anxiety   . GERD (gastroesophageal reflux disease)   . Arthritis   . Diabetes mellitus without complication ([place])   . Type 2 diabetes mellitus ([place])   . Chronic kidney disease, stage V ([place])     Nephrologist is with [facility] (Dr. Tammi Klippel)   Assessment: 54 [name] presents with heart failure exacerbation and R LE swelling, venous duplex of R LE reveals DVT. She has CKD with CrCl < 54ml/min. Pharmacy to dose heparin gtt and warfarin.  Not a good candidate for newer anticoagulants due to poor renal function.  Today, 12/01/2015  CBC: Hgb low, pltc WNL, stable  Renal: SCr = 4.05  INR  1.25    Goal of Therapy:  Heparin level 0.3-0.7 units/ml Monitor platelets by anticoagulation protocol: Yes   Plan:  Day #2 of minimum 5 day overlap for acute VTE  Continue Heparin at 1200 units/hr  Obtain confirmatory level in 8 hours.   Warfarin 7.5mg  PO x 1 tonight  Daily CBC, heparin level, and INR  Monitor for bleeding  Warfarin book/video  Royetta Asal, PharmD, BCPS Pager [phone] 12/01/2015 3:27 AM

## 2015-12-01 NOTE — Evaluation (Signed)
The  Patient  Ambulated 160' x 2 with RW on RA. Oxygen saturation stayed above 91%. The patient is eager to return home.Physical Therapy Evaluation Patient Details Name: Robin [name] MRN: [id number] DOB: Feb 18, 1951 Today's Date: 12/01/2015   History of Present Illness  Robin [name] is a 65 y.o. female with medical history significant for chronic diastolic CHF, hypertension, bipolar disorder, chronic kidney disease stage IV, insulin-dependent diabetes mellitus, and GERD who presents the emergency department with 1 week of progressive dyspnea and leg swelling. Positive for DVT R leg  Clinical Impression  The  Patient tolerated ambulation well on RA using a RW. Oxygen saturation >91%.  Pt admitted with above diagnosis. Pt currently with functional limitations due to the deficits listed below (see PT Problem List).  Pt will benefit from skilled PT to increase their independence and safety with mobility to allow discharge to the venue listed below.       Follow Up Recommendations Home health PT;Supervision/Assistance - 24 hour (patient may decline)    Equipment Recommendations  None recommended by PT    Recommendations for Other Services       Precautions / Restrictions Precautions Precaution Comments: monitor sats, on Heparine/coumadin  Restrictions Weight Bearing Restrictions: No      Mobility  Bed Mobility Overal bed mobility: Independent                Transfers Overall transfer level: Needs assistance   Transfers: Sit to/from Stand Sit to Stand: Supervision            Ambulation/Gait Ambulation/Gait assistance: Min guard Ambulation Distance (Feet): 132 Feet (x 2) Assistive device: Rolling walker (2 wheeled) Gait Pattern/deviations: Step-through pattern     General Gait Details: Sats > 91 % on RA throughout the ambulation  Stairs            Wheelchair Mobility    Modified Rankin (Stroke Patients Only)       Balance Overall balance assessment:  Needs assistance         Standing balance support: Bilateral upper extremity supported;During functional activity Standing balance-Leahy Scale: Fair                               Pertinent Vitals/Pain Pain Assessment: No/denies pain    Home Living Family/patient expects to be discharged to:: Private residence Living Arrangements: Alone;Other relatives Available Help at Discharge: Family;Available PRN/intermittently Type of Home: House Home Access: Stairs to enter Entrance Stairs-Rails: None Entrance Stairs-Number of Steps: 3 Home Layout: Multi-level;Able to live on main level with bedroom/bathroom Home Equipment: Gilford Rile - 2 wheels;Walker - 4 wheels;Wheelchair - [employer];Tub bench      Prior Function Level of Independence: Independent               Hand Dominance        Extremity/Trunk Assessment   Upper Extremity Assessment: Overall WFL for tasks assessed           Lower Extremity Assessment: Overall WFL for tasks assessed      Cervical / Trunk Assessment: Normal  Communication   Communication: No difficulties  Cognition Arousal/Alertness: Awake/alert Behavior During Therapy: WFL for tasks assessed/performed Overall Cognitive Status: Within Functional Limits for tasks assessed                      General Comments      Exercises        Assessment/Plan  PT Assessment Patient needs continued PT services  PT Diagnosis Generalized weakness   PT Problem List Decreased strength;Decreased activity tolerance;Decreased knowledge of precautions;Decreased mobility  PT Treatment Interventions DME instruction;Gait training;Functional mobility training;Therapeutic activities;Patient/family education   PT Goals (Current goals can be found in the Care Plan section) Acute Rehab PT Goals Patient Stated Goal: to go home PT Goal Formulation: With patient Time For Goal Achievement: 12/15/15 Potential to Achieve Goals:  Good    Frequency Min 3X/week   Barriers to discharge        Co-evaluation               End of Session Equipment Utilized During Treatment: Gait belt Activity Tolerance: Patient tolerated treatment well Patient left: in chair;with call bell/phone within reach;with chair alarm set Nurse Communication: Mobility status         Time: 1042-1100 PT Time Calculation (min) (ACUTE ONLY): 18 min   Charges:   PT Evaluation $PT Eval Low Complexity: 1 Procedure     PT G CodesClaretha Cooper 12/01/2015, 12:23 PM Tresa Endo PT [phone]

## 2015-12-01 NOTE — Progress Notes (Signed)
ANTICOAGULATION CONSULT NOTE - Follow-Up  Pharmacy Consult for heparin/warfarin Indication: DVT  Allergies  Allergen Reactions  . Oxycodone Other (See Comments)    Makes patient lethargic, sleeping for few days, altered mental status    Patient Measurements: Height: 5\' 4"  (162.6 cm) Weight: 179 lb 3.2 oz (81.285 kg) IBW/kg (Calculated) : 54.7 Heparin Dosing Weight: 73kg  Vital Signs: Temp: 98.7 F (37.1 C) (07/08 0300) Temp Source: Oral (07/08 0300) BP: 157/83 mmHg (07/08 0300) Pulse Rate: 74 (07/08 0300)  Labs:  Recent Labs  11/29/15 1837 11/29/15 2126 11/30/15 0232 11/30/15 0854 11/30/15 1625 12/01/15 0201 12/01/15 0931  HGB 8.7*  --   --   --   --  8.4*  --   HCT 27.1*  --   --   --   --  25.6*  --   PLT 234  --   --   --   --  228  --   APTT  --   --   --   --  34  --   --   LABPROT  --   --   --   --  14.4 15.9*  --   INR  --   --   --   --  1.10 1.25  --   HEPARINUNFRC  --   --   --   --   --  0.42 0.42  CREATININE 3.86*  --  3.90*  --   --  4.05*  --   TROPONINI  --  0.03* 0.04* 0.03*  --   --   --     Estimated Creatinine Clearance: 14.7 mL/min (by C-G formula based on Cr of 4.05).   Medical History: Past Medical History  Diagnosis Date  . Coronary artery disease   . Bipolar disorder ([place])   . History of blood transfusion   . Benign hypertension   . Anemia   . Heart murmur   . CHF (congestive heart failure) ([place])   . Sleep apnea   . Shortness of breath dyspnea   . History of pneumonia   . History of bronchitis   . Depression   . Anxiety   . GERD (gastroesophageal reflux disease)   . Arthritis   . Diabetes mellitus without complication ([place])   . Type 2 diabetes mellitus ([place])   . Chronic kidney disease, stage V ([place])     Nephrologist is with [facility] (Dr. Tammi Klippel)   Assessment: 77 [name] presents with heart failure exacerbation and R LE swelling, venous duplex of R LE reveals DVT. She has CKD with CrCl < 30ml/min. Pharmacy to dose  heparin gtt and warfarin.  Not a good candidate for newer anticoagulants due to poor renal function.  Today, 12/01/2015  CBC: Hgb low, pltc WNL, stable  Renal: SCr = 4.05  INR 1.25  Confirmatory heparin level therapeutic  Goal of Therapy:  Heparin level 0.3-0.7 units/ml Monitor platelets by anticoagulation protocol: Yes   Plan:  Day #2 of minimum 5 day overlap for acute VTE  Continue Heparin at 1200 units/hr  Warfarin 7.5mg  PO x 1 tonight  Daily CBC, heparin level, and INR  Monitor for bleeding  Dolly Rias RPh 12/01/2015, 10:36 AM Pager [phone]

## 2015-12-02 DIAGNOSIS — I5023 Acute on chronic systolic (congestive) heart failure: Secondary | ICD-10-CM

## 2015-12-02 LAB — BASIC METABOLIC PANEL
ANION GAP: 7 (ref 5–15)
BUN: 54 mg/dL — ABNORMAL HIGH (ref 6–20)
CHLORIDE: 111 mmol/L (ref 101–111)
CO2: 24 mmol/L (ref 22–32)
Calcium: 8.7 mg/dL — ABNORMAL LOW (ref 8.9–10.3)
Creatinine, Ser: 3.91 mg/dL — ABNORMAL HIGH (ref 0.44–1.00)
GFR calc Af Amer: 13 mL/min — ABNORMAL LOW (ref >60)
GFR, EST NON AFRICAN AMERICAN: 11 mL/min — AB (ref >60)
Glucose, Bld: 81 mg/dL (ref 65–99)
POTASSIUM: 3.9 mmol/L (ref 3.5–5.1)
SODIUM: 142 mmol/L (ref 135–145)

## 2015-12-02 LAB — GLUCOSE, CAPILLARY
GLUCOSE-CAPILLARY: 58 mg/dL — AB (ref 65–99)
GLUCOSE-CAPILLARY: 213 mg/dL — AB (ref 65–99)
GLUCOSE-CAPILLARY: 184 mg/dL — AB (ref 65–99)
Glucose-Capillary: 68 mg/dL (ref 65–99)
Glucose-Capillary: 134 mg/dL — ABNORMAL HIGH (ref 65–99)
Glucose-Capillary: 122 mg/dL — ABNORMAL HIGH (ref 65–99)

## 2015-12-02 LAB — HEPARIN LEVEL (UNFRACTIONATED): HEPARIN UNFRACTIONATED: 0.12 [IU]/mL — AB (ref 0.30–0.70)

## 2015-12-02 LAB — PROTIME-INR
INR: 1.23 (ref 0.00–1.49)
PROTHROMBIN TIME: 15.7 s — AB (ref 11.6–15.2)

## 2015-12-02 MED ORDER — WARFARIN SODIUM 5 MG PO TABS
10.0000 mg | ORAL_TABLET | Freq: Once | ORAL | Status: AC
  Administered 2015-12-02: 10 mg via ORAL
  Filled 2015-12-02: qty 2

## 2015-12-02 MED ORDER — DEXTROSE 50 % IV SOLN
INTRAVENOUS | Status: AC
  Filled 2015-12-02: qty 50

## 2015-12-02 MED ORDER — DEXTROSE 50 % IV SOLN
25.0000 mL | Freq: Once | INTRAVENOUS | Status: AC
  Administered 2015-12-02: 25 mL via INTRAVENOUS

## 2015-12-02 NOTE — Progress Notes (Signed)
Patient hypoglycemic CBG 58 without symptoms. Patient ate 100% of breakfast tray and drank 2 glasses of juice CBG up to 68. Patient then given 1/2 amp of dextrose 50 see  MAR  CBG recheck 134 notified Dr. Wyline Copas  in morning rounds. Will continue to monitor.

## 2015-12-02 NOTE — Progress Notes (Signed)
ANTICOAGULATION CONSULT NOTE - Follow-Up  Pharmacy Consult for heparin/warfarin Indication: DVT  Allergies  Allergen Reactions  . Oxycodone Other (See Comments)    Makes patient lethargic, sleeping for few days, altered mental status    Patient Measurements: Height: 5\' 4"  (162.6 cm) Weight: 179 lb 0.2 oz (81.2 kg) IBW/kg (Calculated) : 54.7 Heparin Dosing Weight: 73kg  Vital Signs: Temp: 99.3 F (37.4 C) (07/09 0617) Temp Source: Oral (07/09 0617) BP: 144/79 mmHg (07/09 0617) Pulse Rate: 80 (07/09 0617)  Labs:  Recent Labs  11/29/15 1837 11/29/15 2126 11/30/15 0232 11/30/15 0854 11/30/15 1625 12/01/15 0201 12/01/15 0931 12/02/15 0435  HGB 8.7*  --   --   --   --  8.4*  --   --   HCT 27.1*  --   --   --   --  25.6*  --   --   PLT 234  --   --   --   --  228  --   --   APTT  --   --   --   --  34  --   --   --   LABPROT  --   --   --   --  14.4 15.9*  --  15.7*  INR  --   --   --   --  1.10 1.25  --  1.23  HEPARINUNFRC  --   --   --   --   --  0.42 0.42 0.12*  CREATININE 3.86*  --  3.90*  --   --  4.05*  --  3.91*  TROPONINI  --  0.03* 0.04* 0.03*  --   --   --   --     Estimated Creatinine Clearance: 15.2 mL/min (by C-G formula based on Cr of 3.91).   Medical History: Past Medical History  Diagnosis Date  . Coronary artery disease   . Bipolar disorder ([place])   . History of blood transfusion   . Benign hypertension   . Anemia   . Heart murmur   . CHF (congestive heart failure) ([place])   . Sleep apnea   . Shortness of breath dyspnea   . History of pneumonia   . History of bronchitis   . Depression   . Anxiety   . GERD (gastroesophageal reflux disease)   . Arthritis   . Diabetes mellitus without complication ([place])   . Type 2 diabetes mellitus ([place])   . Chronic kidney disease, stage V ([place])     Nephrologist is with [facility] (Dr. Tammi Klippel)   Assessment: 86 [name] presents with heart failure exacerbation and R LE swelling, venous duplex of R LE reveals  DVT. She has CKD with CrCl < 20ml/min. Pharmacy to dose heparin gtt and warfarin.  Not a good candidate for newer anticoagulants due to poor renal function.  Today, 12/02/2015   Pt lost IV access for ~ 2 hours this am, HL drawn during this time, therefore inaccurate reading  CBC: Hgb low, pltc WNL, stable  Renal: SCr = 3.91  INR 1.23 (subtherapeutic)  No bleeding issues per RN  Pt taking aspirin 81mg  daily   Goal of Therapy:  Heparin level 0.3-0.7 units/ml Monitor platelets by anticoagulation protocol: Yes   Plan:  Day #3 of minimum 5 day overlap for acute VTE  Continue Heparin at 1200 units/hr  Warfarin 10mg  PO x 1 tonight  Daily CBC, heparin level, and INR  Monitor for bleeding  Dolly Rias RPh 12/02/2015, 8:23  AM Pager [phone]

## 2015-12-02 NOTE — Progress Notes (Signed)
PROGRESS NOTE    Robin [name]  [id number] DOB: [birth year] DOA: 11/29/2015 PCP: [place]    Brief Narrative:  65 y.o. female with medical history significant for chronic diastolic CHF, hypertension, bipolar disorder, chronic kidney disease stage IV, insulin-dependent diabetes mellitus, and GERD who presents the emergency department with 1 week of progressive dyspnea and leg swelling. Patient reports pain in her usual state of health until the insidious development of dyspnea approximately one week prior to admission. In ED, patient was noted to be afebrile with O2 sats of around 88% on room air. Chest x-rays were notable for findings of CHF. Patient was admitted for further workup of acute on chronic diastolic congestive heart failure.   Assessment & Plan:   Principal Problem:   Acute on chronic diastolic heart failure (HCC) Active Problems:   DM type 2, uncontrolled, with renal complications (HCC)   Essential hypertension, benign   CAD (coronary artery disease)   Anemia in chronic kidney disease   Bipolar affective disorder (HCC)   Chronic renal disease, stage 4, severely decreased glomerular filtration rate between 15-29 mL/min/1.73 square meter (HCC)   Acute respiratory failure with hypoxia (HCC)   CHF exacerbation (HCC)   Right leg swelling   Acute on chronic combined systolic and diastolic CHF (congestive heart failure) (HCC)   Acute respiratory failure with hypoxemia (HCC)   Insulin dependent diabetes mellitus ([place])   1. Acute on chronic diastolic CHF  - Pt presents dyspneic with LE edema, S3 gallop, and edema on CXR  - TTE (11/18/14) with EF 0000000, grade 2 diastolic dysfunction, mild LAE  - Managed with Bumex 4mg  BID and Coreg at home  - Holding Coreg until better compensated; no ACE-i given renal insufficiency  - Currently on 60 mg IV Lasix q12h with 1750cc out this AM - Cr stable at 3.9. Seems to be close to baseline. Pt still making good urine. -  Wt today: 81.2kg - Wt on admit: 82.1kg   - Will ensure patient maintains fluid restricted diet  2. Acute hypoxic respiratory failure  - Suspected secondary to acute on chronic diastolic CHF as above  - Now on RA - Resolved   3. CKD stage IV  - SCr 3.86 on admission, appears consistent with her baseline  - Followed by nephrology through the [facility] and has mature AVF that has never been used  - Avoiding nephrotoxins, renally-dosing medications  - Follow renal function closely  4. Chest pain, hx of CAD  - Left-sided exertional CP reported, described as mild and "burning"  - Initial troponin wnl at 0.03; EKG with no acute ischemic features  - ASA 324 mg given, will monitor on telemetry for ischemic changes, cycle troponins  - Continue daily ASA 81, Lipitor, Imdur; plan to resume Coreg once CHF better compensated   5. Hypertension  - Managed with Coreg, Norvasc, hydralazine at home  - Continue Norvasc and hydralazine, resume Coreg once CHF better compensated   6. Type II DM  - A1c 6.4% in August 2016  - Managed with Lantus 40 units qAM and 30 units qPM, and Novolog per sliding-scale  - Check CBG's ACHS  - Continue basal coverage with Lantus 30 units BID, start meal-time coverage with 4 units Humalog, plus sliding-scale correctional  - Cont Carb-modified diet   7. Bipolar disorder  - Stable  - Continue current management with Prolixin, Seroquel, and Cogentin   8. RLE edema  - Suspected secondary to CHF with LE DVT (see  below)  9. Normocytic anemia  - Hgb 8.7 on admission, down from 11.6 in February 2016, but had been in 8's prior to that  - No s/s of active bleed; suspect some of the drop is secondary to hemodilution in setting of acute CHF  10. Unilateral right lower extremity swelling - Lower extremity Dopplers confirm DVT involving a small segment of the right distal femoral vein. - Patient's chronic kidney disease makes choosing long-term  anticoagulation challenging. - Have started coumadin with heparin gtt - Follow daily INR   DVT prophylaxis: Therapeutic heparin bridge Code Status: Full Family Communication: Patient in room, family at bedside Disposition Plan: Possible home in 2-4 days   Consultants:     Procedures:     Antimicrobials:  Antibiotics Given (last 72 hours)    None          Subjective: Eager to go home  Objective: Filed Vitals:   12/01/15 2038 12/02/15 0617 12/02/15 1221 12/02/15 1341  BP: 162/83 144/79 149/74 155/62  Pulse: 90 80  87  Temp: 98 F (36.7 C) 99.3 F (37.4 C)  98 F (36.7 C)  TempSrc: Oral Oral  Oral  Resp: 16 20  18   Height:      Weight:  81.2 kg (179 lb 0.2 oz)    SpO2: 91% 97% 99% 97%    Intake/Output Summary (Last 24 hours) at 12/02/15 1416 Last data filed at 12/02/15 1322  Gross per 24 hour  Intake  737.8 ml  Output   2650 ml  Net -1912.2 ml   Filed Weights   11/29/15 2115 12/01/15 0359 12/02/15 0617  Weight: 82.146 kg (181 lb 1.6 oz) 81.285 kg (179 lb 3.2 oz) 81.2 kg (179 lb 0.2 oz)    Examination:  General exam: Appears calm and comfortable, Sitting in chair Respiratory system: Clear to auscultation. Respiratory effort normal. Cardiovascular system: S1 & S2 heard, RRR Gastrointestinal system: Abdomen is nondistended, soft and nontender. No organomegaly or masses felt. Normal bowel sounds heard. Central nervous system: Alert and oriented. No focal neurological deficits. Extremities: Symmetric 5 x 5 power, RLE edematous Skin: No rashes, lesions or ulcers Psychiatry: Judgement and insight appear normal. Mood & affect appropriate.   Data Reviewed: I have personally reviewed following labs and imaging studies  CBC:  Recent Labs Lab 11/29/15 1837 12/01/15 0201  WBC 7.6 6.4  HGB 8.7* 8.4*  HCT 27.1* 25.6*  MCV 95.8 94.5  PLT 234 [id number]   Basic Metabolic Panel:  Recent Labs Lab 11/29/15 1837 11/30/15 0232 12/01/15 0201 12/02/15 0435    NA 138 139 139 142  K 4.2 4.1 3.6 3.9  CL 110 111 110 111  CO2 19* 21* 22 24  GLUCOSE 245* 253* 78 81  BUN 52* 53* 52* 54*  CREATININE 3.86* 3.90* 4.05* 3.91*  CALCIUM 8.9 9.1 8.8* 8.7*   GFR: Estimated Creatinine Clearance: 15.2 mL/min (by C-G formula based on Cr of 3.91). Liver Function Tests: No results for input(s): AST, ALT, ALKPHOS, BILITOT, PROT, ALBUMIN in the last 168 hours. No results for input(s): LIPASE, AMYLASE in the last 168 hours. No results for input(s): AMMONIA in the last 168 hours. Coagulation Profile:  Recent Labs Lab 11/30/15 1625 12/01/15 0201 12/02/15 0435  INR 1.10 1.25 1.23   Cardiac Enzymes:  Recent Labs Lab 11/29/15 2126 11/30/15 0232 11/30/15 0854  TROPONINI 0.03* 0.04* 0.03*   BNP (last 3 results) No results for input(s): PROBNP in the last 8760 hours. HbA1C: No results for input(s):  HGBA1C in the last 72 hours. CBG:  Recent Labs Lab 12/01/15 2230 12/02/15 0751 12/02/15 0815 12/02/15 0909 12/02/15 1144  GLUCAP 148* 58* 68 134* 213*   Lipid Profile: No results for input(s): CHOL, HDL, LDLCALC, TRIG, CHOLHDL, LDLDIRECT in the last 72 hours. Thyroid Function Tests: No results for input(s): TSH, T4TOTAL, FREET4, T3FREE, THYROIDAB in the last 72 hours. Anemia Panel: No results for input(s): VITAMINB12, FOLATE, FERRITIN, TIBC, IRON, RETICCTPCT in the last 72 hours. Sepsis Labs: No results for input(s): PROCALCITON, LATICACIDVEN in the last 168 hours.  No results found for this or any previous visit (from the past 240 hour(s)).       Radiology Studies: No results found.      Scheduled Meds: . amLODipine  10 mg Oral Daily  . antiseptic oral rinse  7 mL Mouth Rinse q12n4p  . aspirin EC  81 mg Oral Daily  . atorvastatin  40 mg Oral QPM  . benztropine  0.5 mg Oral BID  . chlorhexidine  15 mL Mouth Rinse BID  . fluPHENAZine  1 mg Oral BID  . furosemide  60 mg Intravenous Q12H  . gabapentin  300 mg Oral Daily  .  hydrALAZINE  100 mg Oral BID  . insulin aspart  0-15 Units Subcutaneous TID WC  . insulin glargine  10 Units Subcutaneous QHS  . isosorbide mononitrate  60 mg Oral Daily  . pantoprazole  20 mg Oral Daily  . QUEtiapine  400 mg Oral QHS  . senna-docusate  1 tablet Oral BID  . sodium chloride flush  3 mL Intravenous Q12H  . warfarin  10 mg Oral ONCE-1800  . Warfarin - Pharmacist Dosing Inpatient   Does not apply q1800   Continuous Infusions: . heparin 1,200 Units/hr (12/02/15 0549)     LOS: 2 days    CHIU, Orpah Melter, MD [facility] Pager [phone]  If 7PM-7AM, please contact night-coverage [email or web address] Password [email or web address] 12/02/2015, 2:16 PM

## 2015-12-03 DIAGNOSIS — I509 Heart failure, unspecified: Secondary | ICD-10-CM

## 2015-12-03 LAB — BASIC METABOLIC PANEL
Anion gap: 9 (ref 5–15)
BUN: 58 mg/dL — ABNORMAL HIGH (ref 6–20)
CALCIUM: 8.7 mg/dL — AB (ref 8.9–10.3)
CO2: 24 mmol/L (ref 22–32)
CREATININE: 3.98 mg/dL — AB (ref 0.44–1.00)
Chloride: 107 mmol/L (ref 101–111)
GFR calc Af Amer: 13 mL/min — ABNORMAL LOW (ref >60)
GFR, EST NON AFRICAN AMERICAN: 11 mL/min — AB (ref >60)
GLUCOSE: 140 mg/dL — AB (ref 65–99)
Potassium: 3.9 mmol/L (ref 3.5–5.1)
Sodium: 140 mmol/L (ref 135–145)

## 2015-12-03 LAB — CBC
HEMATOCRIT: 27 % — AB (ref 36.0–46.0)
Hemoglobin: 9 g/dL — ABNORMAL LOW (ref 12.0–15.0)
MCH: 31.3 pg (ref 26.0–34.0)
MCHC: 33.3 g/dL (ref 30.0–36.0)
MCV: 93.8 fL (ref 78.0–100.0)
PLATELETS: 258 10*3/uL (ref 150–400)
RBC: 2.88 MIL/uL — ABNORMAL LOW (ref 3.87–5.11)
RDW: 16 % — AB (ref 11.5–15.5)
WBC: 7.3 10*3/uL (ref 4.0–10.5)

## 2015-12-03 LAB — GLUCOSE, CAPILLARY
Glucose-Capillary: 129 mg/dL — ABNORMAL HIGH (ref 65–99)
Glucose-Capillary: 290 mg/dL — ABNORMAL HIGH (ref 65–99)
Glucose-Capillary: 133 mg/dL — ABNORMAL HIGH (ref 65–99)
Glucose-Capillary: 182 mg/dL — ABNORMAL HIGH (ref 65–99)

## 2015-12-03 LAB — MAGNESIUM: Magnesium: 2 mg/dL (ref 1.7–2.4)

## 2015-12-03 LAB — HEPARIN LEVEL (UNFRACTIONATED): HEPARIN UNFRACTIONATED: 0.34 [IU]/mL (ref 0.30–0.70)

## 2015-12-03 LAB — PROTIME-INR
INR: 1.41 (ref 0.00–1.49)
PROTHROMBIN TIME: 17.4 s — AB (ref 11.6–15.2)

## 2015-12-03 MED ORDER — WARFARIN SODIUM 5 MG PO TABS
10.0000 mg | ORAL_TABLET | Freq: Once | ORAL | Status: AC
  Administered 2015-12-03: 10 mg via ORAL
  Filled 2015-12-03: qty 2

## 2015-12-03 MED ORDER — CARVEDILOL 25 MG PO TABS
25.0000 mg | ORAL_TABLET | Freq: Two times a day (BID) | ORAL | Status: DC
  Administered 2015-12-03 – 2015-12-05 (×5): 25 mg via ORAL
  Filled 2015-12-03 (×5): qty 1

## 2015-12-03 NOTE — Progress Notes (Signed)
ANTICOAGULATION CONSULT NOTE - Follow-Up  Pharmacy Consult for heparin/warfarin Indication: DVT  Allergies  Allergen Reactions  . Oxycodone Other (See Comments)    Makes patient lethargic, sleeping for few days, altered mental status    Patient Measurements: Height: 5\' 4"  (162.6 cm) Weight: 170 lb 11.2 oz (77.429 kg) IBW/kg (Calculated) : 54.7 Heparin Dosing Weight: 73kg  Vital Signs: Temp: 98.1 F (36.7 C) (07/10 0759) Temp Source: Oral (07/10 0759) BP: 172/89 mmHg (07/10 0759) Pulse Rate: 80 (07/10 0759)  Labs:  Recent Labs  11/30/15 0854  11/30/15 1625  12/01/15 0201 12/01/15 0931 12/02/15 0435 12/03/15 0525  HGB  --   --   --   --  8.4*  --   --  9.0*  HCT  --   --   --   --  25.6*  --   --  27.0*  PLT  --   --   --   --  228  --   --  258  APTT  --   --  34  --   --   --   --   --   LABPROT  --   < > 14.4  --  15.9*  --  15.7* 17.4*  INR  --   < > 1.10  --  1.25  --  1.23 1.41  HEPARINUNFRC  --   --   --   < > 0.42 0.42 0.12* 0.34  CREATININE  --   --   --   --  4.05*  --  3.91* 3.98*  TROPONINI 0.03*  --   --   --   --   --   --   --   < > = values in this interval not displayed.  Estimated Creatinine Clearance: 14.6 mL/min (by C-G formula based on Cr of 3.98).   Medical History: Past Medical History  Diagnosis Date  . Coronary artery disease   . Bipolar disorder ([place])   . History of blood transfusion   . Benign hypertension   . Anemia   . Heart murmur   . CHF (congestive heart failure) ([place])   . Sleep apnea   . Shortness of breath dyspnea   . History of pneumonia   . History of bronchitis   . Depression   . Anxiety   . GERD (gastroesophageal reflux disease)   . Arthritis   . Diabetes mellitus without complication ([place])   . Type 2 diabetes mellitus ([place])   . Chronic kidney disease, stage V ([place])     Nephrologist is with [facility]- (Dr. Tammi Klippel)   Assessment: 64 [name] presents with heart failure exacerbation and R LE swelling, venous duplex  of R LE reveals DVT. She has CKD with CrCl < 62ml/min. Pharmacy to dose heparin gtt and warfarin.  Not a good candidate for newer anticoagulants due to poor renal function.  Today, 12/03/2015   Pt lost IV access for ~ 2 hours 7/9 am, HL was 0.12 units/ml, inaccurate reading  CBC: Hgb low, pltc WNL, stable  Renal: SCr = 3.98 (~ at baseline)  INR 1.41 (subtherapeutic) despite 7.5,7.5 & 10mg  Warfarin  No bleeding issues per RN  Pt taking aspirin 81mg  daily  Goal of Therapy:  Heparin level 0.3-0.7 units/ml Monitor platelets by anticoagulation protocol: Yes   Plan:  Day #4 of minimum 5 day overlap for acute VTE  Continue Heparin at 1200 units/hr  Warfarin 10mg  PO x 1 tonight  Daily CBC, heparin level, and  INR  Monitor for bleeding  Attempted Warfarin counseling x2 yesterday (patient asleep), will attempt education today.  Minda Ditto PharmD Pager [phone] 12/03/2015, 8:21 AM

## 2015-12-03 NOTE — Progress Notes (Signed)
Physical Therapy Treatment Patient Details Name: Robin [name] MRN: [id number] DOB: 10-Oct-1950 Today's Date: 12-16-2015    History of Present Illness Robin [name] is a 65 y.o. female with medical history significant for chronic diastolic CHF, hypertension, bipolar disorder, chronic kidney disease stage IV, insulin-dependent diabetes mellitus, and GERD who presents the emergency department with 1 week of progressive dyspnea and leg swelling. Positive for DVT R leg    PT Comments    Pt mobilizing well today and hopeful for d/c home soon.  Follow Up Recommendations  Home health PT;Supervision/Assistance - 24 hour     Equipment Recommendations  None recommended by PT    Recommendations for Other Services       Precautions / Restrictions Precautions Precaution Comments: monitor sats    Mobility  Bed Mobility Overal bed mobility: Independent                Transfers Overall transfer level: Needs assistance Equipment used: None Transfers: Sit to/from Stand Sit to Stand: Supervision            Ambulation/Gait Ambulation/Gait assistance: Min guard Ambulation Distance (Feet): 260 Feet Assistive device: None (pushed IV pole) Gait Pattern/deviations: Step-through pattern;Decreased stride length     General Gait Details: SpO2 95% on room air during gait, slight antalgic gait due to hx of L foot/ankle ORIF, also with collapsed arch on R foot   Stairs            Wheelchair Mobility    Modified Rankin (Stroke Patients Only)       Balance                                    Cognition Arousal/Alertness: Awake/alert Behavior During Therapy: WFL for tasks assessed/performed Overall Cognitive Status: Within Functional Limits for tasks assessed                      Exercises      General Comments        Pertinent Vitals/Pain Pain Assessment: No/denies pain    Home Living                      Prior Function            PT Goals (current goals can now be found in the care plan section) Progress towards PT goals: Progressing toward goals    Frequency  Min 3X/week    PT Plan Current plan remains appropriate    Co-evaluation             End of Session   Activity Tolerance: Patient tolerated treatment well Patient left: in chair;with call bell/phone within reach;with chair alarm set     Time: [phone] PT Time Calculation (min) (ACUTE ONLY): 9 min  Charges:  $Gait Training: 8-22 mins                    G Codes:      Shondell Fabel,KATHrine E 2015-12-16, 12:39 PM Carmelia Bake, PT, DPT 12-16-2015 Pager: [phone]

## 2015-12-03 NOTE — Progress Notes (Signed)
PROGRESS NOTE    Robin [name]  [id number] DOB: [birth year] DOA: 11/29/2015 PCP: [place]    Brief Narrative:  65 y.o. female with medical history significant for chronic diastolic CHF, hypertension, bipolar disorder, chronic kidney disease stage IV, insulin-dependent diabetes mellitus, and GERD who presents the emergency department with 1 week of progressive dyspnea and leg swelling. Patient reports pain in her usual state of health until the insidious development of dyspnea approximately one week prior to admission. In ED, patient was noted to be afebrile with O2 sats of around 88% on room air. Chest x-rays were notable for findings of CHF. Patient was admitted for further workup of acute on chronic diastolic congestive heart failure.   Assessment & Plan:   Principal Problem:   Acute on chronic diastolic heart failure (HCC) Active Problems:   DM type 2, uncontrolled, with renal complications (HCC)   Essential hypertension, benign   CAD (coronary artery disease)   Anemia in chronic kidney disease   Bipolar affective disorder (HCC)   Chronic renal disease, stage 4, severely decreased glomerular filtration rate between 15-29 mL/min/1.73 square meter (HCC)   Acute respiratory failure with hypoxia (HCC)   CHF exacerbation (HCC)   Right leg swelling   Acute on chronic combined systolic and diastolic CHF (congestive heart failure) (HCC)   Acute respiratory failure with hypoxemia (HCC)   Insulin dependent diabetes mellitus ([place])   1. Acute on chronic diastolic CHF  - Pt presents dyspneic with LE edema, S3 gallop, and edema on CXR  - TTE (11/18/14) with EF 0000000, grade 2 diastolic dysfunction, mild LAE  - Managed with Bumex 4mg  BID and Coreg at home  - Holding Coreg until better compensated; no ACE-i given renal insufficiency  - Currently on 60 mg IV Lasix q12h with 1750cc out this AM - Cr stable at 3.9. Seems to be close to baseline. Pt still making good urine. -  Wt today: 77.4kg - Wt on admit: 82.1kg   - Cont fluid restricted diet  2. Acute hypoxic respiratory failure  - Suspected secondary to acute on chronic diastolic CHF as above  - Currently on RA - Resolved   3. CKD stage IV  - SCr 3.98 on admission, appears consistent with her baseline  - Followed [facility]rology through the [facility] and has mature AVF that has never been used  - Avoiding nephrotoxins, renally-dosing medications  - Follow renal function closely  4. Chest pain, hx of CAD  - Left-sided exertional CP reported, described as mild and "burning"  - Initial troponin wnl at 0.03; EKG with no acute ischemic features  - ASA 324 mg given, will monitor on telemetry for ischemic changes, cycle troponins  - Continue daily ASA 81, Lipitor, Imdur; plan to resume Coreg once CHF better compensated   5. Hypertension  - Managed with Coreg, Norvasc, hydralazine at home  - Continue Norvasc and hydralazine, resume Coreg once CHF better compensated   6. Type II DM  - A1c 6.4% in August 2016  - Managed with Lantus 40 units qAM and 30 units qPM, and Novolog per sliding-scale  - Check CBG's ACHS  - Continue basal coverage with Lantus 30 units BID, start meal-time coverage with 4 units Humalog, plus sliding-scale correctional  - Cont Carb-modified diet   7. Bipolar disorder  - Stable  - Continue current management with Prolixin, Seroquel, and Cogentin   8. RLE edema  - Suspected secondary to CHF with LE DVT (see below)  9.  Normocytic anemia  - Hgb 8.7 on admission, down from 11.6 in February 2016, but had been in 8's prior to that  - No s/s of active bleed; suspect some of the drop is secondary to hemodilution in setting of acute CHF  10. Unilateral right lower extremity swelling - Lower extremity Dopplers confirm DVT involving a small segment of the right distal femoral vein. - Patient's chronic kidney disease makes choosing long-term anticoagulation  challenging. - Have started coumadin with heparin gtt - Follow daily INR. INR remains subtherapeutic   DVT prophylaxis: Therapeutic heparin bridge Code Status: Full Family Communication: Patient in room, family at bedside Disposition Plan: Possible home in 2-4 days   Consultants:     Procedures:     Antimicrobials:  Antibiotics Given (last 72 hours)    None          Subjective: No complaints today. Questioning about going home  Objective: Filed Vitals:   12/02/15 2058 12/03/15 0500 12/03/15 0640 12/03/15 0759  BP: 146/84  162/76 172/89  Pulse: 80  75 80  Temp: 98.7 F (37.1 C)  98.5 F (36.9 C) 98.1 F (36.7 C)  TempSrc: Oral  Oral Oral  Resp: 16  18 18   Height:      Weight:  77.429 kg (170 lb 11.2 oz)    SpO2: 95%  95% 99%    Intake/Output Summary (Last 24 hours) at 12/03/15 1321 Last data filed at 12/03/15 1030  Gross per 24 hour  Intake   1096 ml  Output   2430 ml  Net  -1334 ml   Filed Weights   12/01/15 0359 12/02/15 0617 12/03/15 0500  Weight: 81.285 kg (179 lb 3.2 oz) 81.2 kg (179 lb 0.2 oz) 77.429 kg (170 lb 11.2 oz)    Examination:  General exam: Appears calm and comfortable, Sitting in chair Respiratory system: Clear to auscultation. Respiratory effort normal. Cardiovascular system: S1 & S2 heard, RRR Gastrointestinal system: Abdomen is nondistended, soft and nontender. No organomegaly or masses felt. Normal bowel sounds heard. Central nervous system: Alert and oriented. No focal neurological deficits. Extremities: Symmetric 5 x 5 power, RLE edematous Skin: No rashes, lesions or ulcers Psychiatry: Judgement and insight appear normal. Mood & affect appropriate.   Data Reviewed: I have personally reviewed following labs and imaging studies  CBC:  Recent Labs Lab 11/29/15 1837 12/01/15 0201 12/03/15 0525  WBC 7.6 6.4 7.3  HGB 8.7* 8.4* 9.0*  HCT 27.1* 25.6* 27.0*  MCV 95.8 94.5 93.8  PLT 234 228 0000000   Basic Metabolic  Panel:  Recent Labs Lab 11/29/15 1837 11/30/15 0232 12/01/15 0201 12/02/15 0435 12/03/15 0525  NA 138 139 139 142 140  K 4.2 4.1 3.6 3.9 3.9  CL 110 111 110 111 107  CO2 19* 21* 22 24 24   GLUCOSE 245* 253* 78 81 140*  BUN 52* 53* 52* 54* 58*  CREATININE 3.86* 3.90* 4.05* 3.91* 3.98*  CALCIUM 8.9 9.1 8.8* 8.7* 8.7*  MG  --   --   --   --  2.0   GFR: Estimated Creatinine Clearance: 14.6 mL/min (by C-G formula based on Cr of 3.98). Liver Function Tests: No results for input(s): AST, ALT, ALKPHOS, BILITOT, PROT, ALBUMIN in the last 168 hours. No results for input(s): LIPASE, AMYLASE in the last 168 hours. No results for input(s): AMMONIA in the last 168 hours. Coagulation Profile:  Recent Labs Lab 11/30/15 1625 12/01/15 0201 12/02/15 0435 12/03/15 0525  INR 1.10 1.25 1.23 1.41  Cardiac Enzymes:  Recent Labs Lab 11/29/15 2126 11/30/15 0232 11/30/15 0854  TROPONINI 0.03* 0.04* 0.03*   BNP (last 3 results) No results for input(s): PROBNP in the last 8760 hours. HbA1C: No results for input(s): HGBA1C in the last 72 hours. CBG:  Recent Labs Lab 12/02/15 1144 12/02/15 1638 12/02/15 2058 12/03/15 0739 12/03/15 1226  GLUCAP 213* 184* 122* 129* 290*   Lipid Profile: No results for input(s): CHOL, HDL, LDLCALC, TRIG, CHOLHDL, LDLDIRECT in the last 72 hours. Thyroid Function Tests: No results for input(s): TSH, T4TOTAL, FREET4, T3FREE, THYROIDAB in the last 72 hours. Anemia Panel: No results for input(s): VITAMINB12, FOLATE, FERRITIN, TIBC, IRON, RETICCTPCT in the last 72 hours. Sepsis Labs: No results for input(s): PROCALCITON, LATICACIDVEN in the last 168 hours.  No results found for this or any previous visit (from the past 240 hour(s)).       Radiology Studies: No results found.      Scheduled Meds: . amLODipine  10 mg Oral Daily  . antiseptic oral rinse  7 mL Mouth Rinse q12n4p  . aspirin EC  81 mg Oral Daily  . atorvastatin  40 mg Oral QPM   . benztropine  0.5 mg Oral BID  . carvedilol  25 mg Oral BID WC  . chlorhexidine  15 mL Mouth Rinse BID  . fluPHENAZine  1 mg Oral BID  . furosemide  60 mg Intravenous Q12H  . gabapentin  300 mg Oral Daily  . hydrALAZINE  100 mg Oral BID  . insulin aspart  0-15 Units Subcutaneous TID WC  . insulin glargine  10 Units Subcutaneous QHS  . isosorbide mononitrate  60 mg Oral Daily  . pantoprazole  20 mg Oral Daily  . QUEtiapine  400 mg Oral QHS  . senna-docusate  1 tablet Oral BID  . sodium chloride flush  3 mL Intravenous Q12H  . Warfarin - Pharmacist Dosing Inpatient   Does not apply q1800   Continuous Infusions: . heparin 1,200 Units/hr (12/03/15 0455)     LOS: 3 days    CHIU, Orpah Melter, MD [facility] Pager [phone]  If 7PM-7AM, please contact night-coverage [email or web address] Password [email or web address] 12/03/2015, 1:21 PM

## 2015-12-03 NOTE — Progress Notes (Signed)
CMT notified this RN that patient had 13 beats of vtach, then 14 beats of vtach, then 15 beats of vtach. VSS other than BP is 172/89. No c/o chest pain. Pt is eating. Dr. Wyline Copas notified.

## 2015-12-03 NOTE — Care Management Important Message (Signed)
Important Message  Patient Details  Name: Robin [name] MRN: [id number] Date of Birth: 1950-07-20   Medicare Important Message Given:  Yes    Camillo Flaming 12/03/2015, 10:59 AMImportant Message  Patient Details  Name: Robin [name] MRN: [id number] Date of Birth: 1950-08-17   Medicare Important Message Given:  Yes    Camillo Flaming 12/03/2015, 10:58 AM

## 2015-12-04 LAB — BASIC METABOLIC PANEL
Anion gap: 8 (ref 5–15)
BUN: 66 mg/dL — AB (ref 6–20)
CALCIUM: 8.7 mg/dL — AB (ref 8.9–10.3)
CHLORIDE: 107 mmol/L (ref 101–111)
CO2: 25 mmol/L (ref 22–32)
CREATININE: 4.23 mg/dL — AB (ref 0.44–1.00)
GFR calc Af Amer: 12 mL/min — ABNORMAL LOW (ref >60)
GFR calc non Af Amer: 10 mL/min — ABNORMAL LOW (ref >60)
Glucose, Bld: 192 mg/dL — ABNORMAL HIGH (ref 65–99)
Potassium: 3.7 mmol/L (ref 3.5–5.1)
SODIUM: 140 mmol/L (ref 135–145)

## 2015-12-04 LAB — GLUCOSE, CAPILLARY
GLUCOSE-CAPILLARY: 140 mg/dL — AB (ref 65–99)
GLUCOSE-CAPILLARY: 358 mg/dL — AB (ref 65–99)
GLUCOSE-CAPILLARY: 86 mg/dL (ref 65–99)
GLUCOSE-CAPILLARY: 360 mg/dL — AB (ref 65–99)

## 2015-12-04 LAB — CBC
HCT: 26.4 % — ABNORMAL LOW (ref 36.0–46.0)
HEMOGLOBIN: 8.6 g/dL — AB (ref 12.0–15.0)
MCH: 31.2 pg (ref 26.0–34.0)
MCHC: 32.6 g/dL (ref 30.0–36.0)
MCV: 95.7 fL (ref 78.0–100.0)
PLATELETS: 260 10*3/uL (ref 150–400)
RBC: 2.76 MIL/uL — ABNORMAL LOW (ref 3.87–5.11)
RDW: 16.2 % — AB (ref 11.5–15.5)
WBC: 9.3 10*3/uL (ref 4.0–10.5)

## 2015-12-04 LAB — HEPARIN LEVEL (UNFRACTIONATED): Heparin Unfractionated: 0.28 IU/mL — ABNORMAL LOW (ref 0.30–0.70)

## 2015-12-04 LAB — PROTIME-INR
INR: 2.39 — AB (ref 0.00–1.49)
PROTHROMBIN TIME: 25.8 s — AB (ref 11.6–15.2)

## 2015-12-04 MED ORDER — WARFARIN SODIUM 2 MG PO TABS
2.0000 mg | ORAL_TABLET | Freq: Once | ORAL | Status: AC
  Administered 2015-12-04: 2 mg via ORAL
  Filled 2015-12-04: qty 1

## 2015-12-04 MED ORDER — BUMETANIDE 2 MG PO TABS
4.0000 mg | ORAL_TABLET | Freq: Two times a day (BID) | ORAL | Status: DC
  Administered 2015-12-04 – 2015-12-05 (×3): 4 mg via ORAL
  Filled 2015-12-04 (×3): qty 2

## 2015-12-04 NOTE — Progress Notes (Signed)
[facility] with [facility] for information on which [name] agency the pt was using. [facility] could not give any clear information on the agency.  A call to Rasheena Trettin pt's daughter concerning [name] was made. Pt only have PCS from [place] and use [facility] home [facility]. Referral given to [facility] in house rep.

## 2015-12-04 NOTE — Progress Notes (Signed)
ANTICOAGULATION CONSULT NOTE - Follow-Up  Pharmacy Consult for heparin/warfarin Indication: DVT  Allergies  Allergen Reactions  . Oxycodone Other (See Comments)    Makes patient lethargic, sleeping for few days, altered mental status   Patient Measurements: Height: 5\' 4"  (162.6 cm) Weight: 168 lb 3.4 oz (76.3 kg) IBW/kg (Calculated) : 54.7 Heparin Dosing Weight: 73kg  Vital Signs: Temp: 98.6 F (37 C) (07/11 0455) Temp Source: Oral (07/11 0455) BP: 143/69 mmHg (07/11 0455) Pulse Rate: 74 (07/11 0455)  Labs:  Recent Labs  12/02/15 0435 12/03/15 0525 12/04/15 0433  HGB  --  9.0* 8.6*  HCT  --  27.0* 26.4*  PLT  --  258 260  LABPROT 15.7* 17.4* 25.8*  INR 1.23 1.41 2.39*  HEPARINUNFRC 0.12* 0.34 0.28*  CREATININE 3.91* 3.98* 4.23*   Estimated Creatinine Clearance: 13.6 mL/min (by C-G formula based on Cr of 4.23).  Medical History: Past Medical History  Diagnosis Date  . Coronary artery disease   . Bipolar disorder ([place])   . History of blood transfusion   . Benign hypertension   . Anemia   . Heart murmur   . CHF (congestive heart failure) ([place])   . Sleep apnea   . Shortness of breath dyspnea   . History of pneumonia   . History of bronchitis   . Depression   . Anxiety   . GERD (gastroesophageal reflux disease)   . Arthritis   . Diabetes mellitus without complication ([place])   . Type 2 diabetes mellitus ([place])   . Chronic kidney disease, stage V ([place])     Nephrologist is with [facility] (Dr. Tammi Klippel)   Assessment: 70 [name] presents with heart failure exacerbation and R LE swelling, venous duplex of R LE reveals DVT. She has CKD with CrCl < 25ml/min. Pharmacy to dose heparin gtt and warfarin.  Not a good candidate for newer anticoagulants due to poor renal function.  Today, 12/04/2015   Heparin level slightly below range this am, no problems with IV noted, will not increase Heparin rate with large jump in INR  CBC: Hgb low, pltc WNL, stable  Renal: SCr =  4.23 (increasing again)   INR 2.39 this am, required ~ large doses to reach therapeutic range  No bleeding issues per RN  Pt taking aspirin 81mg  daily  Goal of Therapy:  Heparin level 0.3-0.7 units/ml Monitor platelets by anticoagulation protocol: Yes  INR 2-3   Plan:  Day #5 of overlap Heparin/Warfarin for acute VTE D 1/2 overlap with therapeutic INR  Continue Heparin at 1200 units/hr  Warfarin 2mg  today at 1800 to avoid overshoot of INR - anticipate patient will be covered by at least 5mg  Warfarin daily  Daily CBC, heparin level, and INR  Monitor for bleeding  Attempted Warfarin counseling x2 on 7/9 (patient asleep)  Discussed Warfarin 7/10 with patient - was confused with Warfarin/diet concerns, will continue to counsel today  PCP noted as [facility], where will patient be seen for INR/Warfarin dosing (would suggest close coverage of INR/patient compliance & understanding)  Minda Ditto PharmD Pager [phone] 12/04/2015, 9:15 AM

## 2015-12-04 NOTE — Progress Notes (Signed)
Spoke with pt's daughter concerning [place], [facility].

## 2015-12-04 NOTE — Progress Notes (Signed)
PROGRESS NOTE    Robin [name]  [id number] DOB: 10-Jul-1950 DOA: 11/29/2015 PCP: [facility]    Brief Narrative:  65 y.o. female with medical history significant for chronic diastolic CHF, hypertension, bipolar disorder, chronic kidney disease stage IV, insulin-dependent diabetes mellitus, and GERD who presents the emergency department with 1 week of progressive dyspnea and leg swelling. Patient reports pain in her usual state of health until the insidious development of dyspnea approximately one week prior to admission. In ED, patient was noted to be afebrile with O2 sats of around 88% on room air. Chest x-rays were notable for findings of CHF. Patient was admitted for further workup of acute on chronic diastolic congestive heart failure.   Assessment & Plan:   Principal Problem:   Acute on chronic diastolic heart failure (HCC) Active Problems:   DM type 2, uncontrolled, with renal complications (HCC)   Essential hypertension, benign   CAD (coronary artery disease)   Anemia in chronic kidney disease   Bipolar affective disorder (HCC)   Chronic renal disease, stage 4, severely decreased glomerular filtration rate between 15-29 mL/min/1.73 square meter (HCC)   Acute respiratory failure with hypoxia (HCC)   CHF exacerbation (HCC)   Right leg swelling   Acute on chronic combined systolic and diastolic CHF (congestive heart failure) (HCC)   Acute respiratory failure with hypoxemia (HCC)   Insulin dependent diabetes mellitus ([place])   1. Acute on chronic diastolic CHF  - Pt presents dyspneic with LE edema, S3 gallop, and edema on CXR  - TTE (11/18/14) with EF 0000000, grade 2 diastolic dysfunction, mild LAE  - Managed with Bumex 4mg  BID and Coreg at home  - Holding Coreg until better compensated; no ACE-i given renal insufficiency  - Patient had been on 60 mg IV Lasix every 12 hours. - Creatinine trending up to over 4. Well transition patient to home Bumex. - Wt today:  76.34kg - Wt on admit: 82.1kg   - Cont fluid restricted diet  2. Acute hypoxic respiratory failure  - Suspected secondary to acute on chronic diastolic CHF as above  - Currently on RA - Resolved   3. CKD stage IV  - SCr 3.98 on admission, appears consistent with her baseline  - Followed by nephrology through the [facility] and has mature AVF that has never been used  - Avoiding nephrotoxins, renally-dosing medications  - Follow renal function closely  4. Chest pain, hx of CAD  - Left-sided exertional CP reported, described as mild and "burning"  - Initial troponin wnl at 0.03; EKG with no acute ischemic features  - ASA 324 mg given, will monitor on telemetry for ischemic changes, cycle troponins  - Continue daily ASA 81, Lipitor, Imdur; plan to resume Coreg once CHF better compensated   5. Hypertension  - Managed with Coreg, Norvasc, hydralazine at home  - Continue Norvasc and hydralazine, resume Coreg once CHF better compensated   6. Type II DM  - A1c 6.4% in August 2016  - Managed with Lantus 40 units qAM and 30 units qPM, and Novolog per sliding-scale  - Check CBG's ACHS  - Continue basal coverage with Lantus 30 units BID, start meal-time coverage with 4 units Humalog, plus sliding-scale correctional  - Cont Carb-modified diet   7. Bipolar disorder  - Stable  - Continue current management with Prolixin, Seroquel, and Cogentin   8. RLE edema  - Suspected secondary to CHF with LE DVT (see below)  9. Normocytic anemia  -  Hgb 8.7 on admission, down from 11.6 in February 2016, but had been in 8's prior to that  - No s/s of active bleed; suspect some of the drop is secondary to hemodilution in setting of acute CHF  10. Unilateral right lower extremity swelling - Lower extremity Dopplers confirm DVT involving a small segment of the right distal femoral vein. - Patient's chronic kidney disease makes choosing long-term anticoagulation challenging. - Have  started coumadin with heparin gtt - Follow daily INR. INR now therapeutic. Discussed with pharmacy. Plan for another dose of Coumadin tonight.   DVT prophylaxis: Therapeutic heparin bridge Code Status: Full Family Communication: Patient in room, family not at bedside Disposition Plan: Possible discharge home on 12/05/2015   Consultants:     Procedures:     Antimicrobials:  Antibiotics Given (last 72 hours)    None          Subjective: Patient without complaints. Denies shortness of breath. Eager to go home.  Objective: Filed Vitals:   12/03/15 2159 12/04/15 0455 12/04/15 0920 12/04/15 1356  BP: 134/59 143/69 147/84 145/78  Pulse: 76 74  80  Temp: 98.6 F (37 C) 98.6 F (37 C)  98.6 F (37 C)  TempSrc: Oral Oral  Oral  Resp: 18 18  18   Height:      Weight:  76.3 kg (168 lb 3.4 oz)    SpO2: 95% 95%  97%    Intake/Output Summary (Last 24 hours) at 12/04/15 1502 Last data filed at 12/04/15 1300  Gross per 24 hour  Intake    600 ml  Output   2425 ml  Net  -1825 ml   Filed Weights   12/02/15 0617 12/03/15 0500 12/04/15 0455  Weight: 81.2 kg (179 lb 0.2 oz) 77.429 kg (170 lb 11.2 oz) 76.3 kg (168 lb 3.4 oz)    Examination:  General exam: Appears calm and comfortable, Sitting in chair Respiratory system: Clear to auscultation. Respiratory effort normal. Cardiovascular system: S1 & S2 heard, RRR Gastrointestinal system: Abdomen is nondistended, soft and nontender. No organomegaly or masses felt. Normal bowel sounds heard. Central nervous system: Alert and oriented. No focal neurological deficits. Extremities: Symmetric 5 x 5 power, RLE edematous Skin: No rashes, lesions Psychiatry: Judgement and insight appear normal. Mood & affect appropriate.   Data Reviewed: I have personally reviewed following labs and imaging studies  CBC:  Recent Labs Lab 11/29/15 1837 12/01/15 0201 12/03/15 0525 12/04/15 0433  WBC 7.6 6.4 7.3 9.3  HGB 8.7* 8.4* 9.0* 8.6*    HCT 27.1* 25.6* 27.0* 26.4*  MCV 95.8 94.5 93.8 95.7  PLT 234 228 258 [id number]   Basic Metabolic Panel:  Recent Labs Lab 11/30/15 0232 12/01/15 0201 12/02/15 0435 12/03/15 0525 12/04/15 0433  NA 139 139 142 140 140  K 4.1 3.6 3.9 3.9 3.7  CL 111 110 111 107 107  CO2 21* 22 24 24 25   GLUCOSE 253* 78 81 140* 192*  BUN 53* 52* 54* 58* 66*  CREATININE 3.90* 4.05* 3.91* 3.98* 4.23*  CALCIUM 9.1 8.8* 8.7* 8.7* 8.7*  MG  --   --   --  2.0  --    GFR: Estimated Creatinine Clearance: 13.6 mL/min (by C-G formula based on Cr of 4.23). Liver Function Tests: No results for input(s): AST, ALT, ALKPHOS, BILITOT, PROT, ALBUMIN in the last 168 hours. No results for input(s): LIPASE, AMYLASE in the last 168 hours. No results for input(s): AMMONIA in the last 168 hours. Coagulation Profile:  Recent Labs Lab 11/30/15 1625 12/01/15 0201 12/02/15 0435 12/03/15 0525 12/04/15 0433  INR 1.10 1.25 1.23 1.41 2.39*   Cardiac Enzymes:  Recent Labs Lab 11/29/15 2126 11/30/15 0232 11/30/15 0854  TROPONINI 0.03* 0.04* 0.03*   BNP (last 3 results) No results for input(s): PROBNP in the last 8760 hours. HbA1C: No results for input(s): HGBA1C in the last 72 hours. CBG:  Recent Labs Lab 12/03/15 1226 12/03/15 1656 12/03/15 2158 12/04/15 0728 12/04/15 1147  GLUCAP 290* 133* 182* 140* 358*   Lipid Profile: No results for input(s): CHOL, HDL, LDLCALC, TRIG, CHOLHDL, LDLDIRECT in the last 72 hours. Thyroid Function Tests: No results for input(s): TSH, T4TOTAL, FREET4, T3FREE, THYROIDAB in the last 72 hours. Anemia Panel: No results for input(s): VITAMINB12, FOLATE, FERRITIN, TIBC, IRON, RETICCTPCT in the last 72 hours. Sepsis Labs: No results for input(s): PROCALCITON, LATICACIDVEN in the last 168 hours.  No results found for this or any previous visit (from the past 240 hour(s)).       Radiology Studies: No results found.      Scheduled Meds: . amLODipine  10 mg Oral  Daily  . antiseptic oral rinse  7 mL Mouth Rinse q12n4p  . aspirin EC  81 mg Oral Daily  . atorvastatin  40 mg Oral QPM  . benztropine  0.5 mg Oral BID  . bumetanide  4 mg Oral BID  . carvedilol  25 mg Oral BID WC  . chlorhexidine  15 mL Mouth Rinse BID  . fluPHENAZine  1 mg Oral BID  . gabapentin  300 mg Oral Daily  . hydrALAZINE  100 mg Oral BID  . insulin aspart  0-15 Units Subcutaneous TID WC  . insulin glargine  10 Units Subcutaneous QHS  . isosorbide mononitrate  60 mg Oral Daily  . pantoprazole  20 mg Oral Daily  . QUEtiapine  400 mg Oral QHS  . senna-docusate  1 tablet Oral BID  . sodium chloride flush  3 mL Intravenous Q12H  . warfarin  2 mg Oral ONCE-1800  . Warfarin - Pharmacist Dosing Inpatient   Does not apply q1800   Continuous Infusions: . heparin 1,200 Units/hr (12/04/15 0114)     LOS: 4 days    Lidiya Reise, Orpah Melter, MD [facility] Pager [phone]  If 7PM-7AM, please contact night-coverage [email or web address] Password [email or web address] 12/04/2015, 3:02 PM

## 2015-12-05 DIAGNOSIS — J9601 Acute respiratory failure with hypoxia: Secondary | ICD-10-CM

## 2015-12-05 DIAGNOSIS — N183 Chronic kidney disease, stage 3 (moderate): Secondary | ICD-10-CM

## 2015-12-05 DIAGNOSIS — E1122 Type 2 diabetes mellitus with diabetic chronic kidney disease: Secondary | ICD-10-CM

## 2015-12-05 DIAGNOSIS — E1165 Type 2 diabetes mellitus with hyperglycemia: Secondary | ICD-10-CM

## 2015-12-05 DIAGNOSIS — I2510 Atherosclerotic heart disease of native coronary artery without angina pectoris: Secondary | ICD-10-CM

## 2015-12-05 DIAGNOSIS — N184 Chronic kidney disease, stage 4 (severe): Secondary | ICD-10-CM

## 2015-12-05 DIAGNOSIS — I10 Essential (primary) hypertension: Secondary | ICD-10-CM

## 2015-12-05 DIAGNOSIS — Z794 Long term (current) use of insulin: Secondary | ICD-10-CM

## 2015-12-05 DIAGNOSIS — I82401 Acute embolism and thrombosis of unspecified deep veins of right lower extremity: Secondary | ICD-10-CM

## 2015-12-05 DIAGNOSIS — I5033 Acute on chronic diastolic (congestive) heart failure: Secondary | ICD-10-CM

## 2015-12-05 DIAGNOSIS — D631 Anemia in chronic kidney disease: Secondary | ICD-10-CM

## 2015-12-05 DIAGNOSIS — N189 Chronic kidney disease, unspecified: Secondary | ICD-10-CM

## 2015-12-05 DIAGNOSIS — I824Y1 Acute embolism and thrombosis of unspecified deep veins of right proximal lower extremity: Secondary | ICD-10-CM

## 2015-12-05 DIAGNOSIS — F3170 Bipolar disorder, currently in remission, most recent episode unspecified: Secondary | ICD-10-CM

## 2015-12-05 HISTORY — DX: Acute embolism and thrombosis of unspecified deep veins of right lower extremity: I82.401

## 2015-12-05 LAB — CBC
HCT: 28.4 % — ABNORMAL LOW (ref 36.0–46.0)
Hemoglobin: 9.2 g/dL — ABNORMAL LOW (ref 12.0–15.0)
MCH: 30.8 pg (ref 26.0–34.0)
MCHC: 32.4 g/dL (ref 30.0–36.0)
MCV: 95 fL (ref 78.0–100.0)
PLATELETS: 275 10*3/uL (ref 150–400)
RBC: 2.99 MIL/uL — AB (ref 3.87–5.11)
RDW: 16.3 % — ABNORMAL HIGH (ref 11.5–15.5)
WBC: 10.3 10*3/uL (ref 4.0–10.5)

## 2015-12-05 LAB — BASIC METABOLIC PANEL
ANION GAP: 9 (ref 5–15)
BUN: 61 mg/dL — ABNORMAL HIGH (ref 6–20)
CO2: 25 mmol/L (ref 22–32)
Calcium: 9 mg/dL (ref 8.9–10.3)
Chloride: 105 mmol/L (ref 101–111)
Creatinine, Ser: 3.72 mg/dL — ABNORMAL HIGH (ref 0.44–1.00)
GFR, EST AFRICAN AMERICAN: 14 mL/min — AB (ref >60)
GFR, EST NON AFRICAN AMERICAN: 12 mL/min — AB (ref >60)
GLUCOSE: 137 mg/dL — AB (ref 65–99)
POTASSIUM: 3.8 mmol/L (ref 3.5–5.1)
SODIUM: 139 mmol/L (ref 135–145)

## 2015-12-05 LAB — HEPARIN LEVEL (UNFRACTIONATED): HEPARIN UNFRACTIONATED: 0.33 [IU]/mL (ref 0.30–0.70)

## 2015-12-05 LAB — PROTIME-INR
INR: 2.46 — ABNORMAL HIGH (ref 0.00–1.49)
PROTHROMBIN TIME: 25.6 s — AB (ref 11.6–15.2)

## 2015-12-05 LAB — GLUCOSE, CAPILLARY: GLUCOSE-CAPILLARY: 129 mg/dL — AB (ref 65–99)

## 2015-12-05 MED ORDER — WARFARIN SODIUM 2 MG PO TABS
2.0000 mg | ORAL_TABLET | Freq: Once | ORAL | Status: AC
  Administered 2015-12-05: 2 mg via ORAL
  Filled 2015-12-05 (×2): qty 1

## 2015-12-05 MED ORDER — WARFARIN SODIUM 2 MG PO TABS: 4.0000 mg | ORAL_TABLET | Freq: Every day | ORAL | Status: DC

## 2015-12-05 NOTE — Discharge Planning (Signed)
[place] for PCP confirmation.  Patient PCP is Dr Marsh Dolly. Her clinic number is [phone].  The fax number for PT/INR lab results is [phone].  This information has been provided to [employer] RN [employer] forwarder.

## 2015-12-05 NOTE — Discharge Summary (Signed)
Physician Discharge Summary  Robin [name] [id number] DOB: June 01, 1950 DOA: 11/29/2015  PCP: [place] date: 11/29/2015 Discharge date: 12/05/2015  Time spent: 55 minutes  Recommendations for Outpatient Follow-up:  1. INR to be check by Home health RN on Friday, 7/15 and results to be sent to PCP  Discharge Condition: stable    Discharge Diagnoses:  Principal Problem:   Acute on chronic diastolic heart failure ([place]) Active Problems:   Chronic renal disease, stage 4, severely decreased glomerular filtration rate between 15-29 mL/min/1.73 square meter (HCC)   Acute respiratory failure with hypoxemia (HCC)   Right leg DVT ([place])   DM type 2, uncontrolled, with renal complications (HCC)   Essential hypertension, benign   CAD (coronary artery disease), native coronary artery with 2 stents    Essential hypertension   Anemia in chronic kidney disease   Hypertensive heart/renal disease with failure ([place])   Bipolar affective disorder ([place])   History of present illness:  65 y.o. female with medical history significant for chronic diastolic CHF, hypertension, bipolar disorder, chronic kidney disease stage IV, insulin-dependent diabetes mellitus, and GERD who presents the emergency department with 1 week of progressive dyspnea and leg swelling. Patient reports pain in her usual state of health until the insidious development of dyspnea approximately one week prior to admission. In ED, patient was noted to be afebrile with O2 sats of around 88% on room air. Chest x-rays were notable for findings of CHF. Patient was admitted for further workup of acute on chronic diastolic congestive heart failure.  Hospital Course:   Acute on chronic diastolic CHF  - dyspnea with LE edema, S3 gallop, and edema on CXR  - TTE (11/18/14) with EF 0000000, grade 2 diastolic dysfunction, mild LAE  - Managed with Bumex 4mg  BID and Coreg at home  -  no ACE-i due to  renal insufficiency  - has  diuresed well- Wt on admit: 82.1kg >> 75 kg today- resumed home dose of Bumex and resumed Coreg  Acute hypoxic respiratory failure  - Suspected secondary to acute on chronic diastolic CHF as above  - Resolved    Right CVT - Lower extremity Dopplers confirm DVT involving a small segment of the right distal femoral vein. - Patient's chronic kidney disease makes choosing long-term anticoagulation challenging. - Have started coumadin   - INR therapeutic- Heparin stopped  CKD stage IV  - SCr 3.98 on admission, appears consistent with her baseline  - Followed by nephrology through the [name] and has mature AVF that has never been used  - Avoiding nephrotoxins, renally-dosing medications  - Cr 3.72 today   Chest pain, hx of CAD  - Left-sided exertional CP reported, described as mild and "burning"  - Initial troponin wnl at 0.03; EKG with no acute ischemic features  - ASA 324 mg given, will monitor on telemetry for ischemic changes, cycle troponins  - Continue daily ASA 81, Lipitor, Imdur; plan to resume Coreg once CHF better compensated   Hypertension  - Managed with Coreg, Norvasc, hydralazine at home    Type II DM  - A1c 6.4% in August 2016  - Managed with Lantus 40 units qAM and 30 units qPM, and Novolog per sliding-scale    Bipolar disorder  - Stable  - Continue current management with Prolixin, Seroquel, and Cogentin   Normocytic anemia  - Hgb 8.7 on admission, down from 11.6 in February 2016, but had been in 8's prior to that  - No s/s  of active bleed; suspect some of the drop is secondary to hemodilution in setting of acute CHF    Procedures: Venous Duplex 11/30/14 Summary:  - Findings consistent with acute deep vein thrombosis involving the  distal right femoral vein. - No evidence of Baker&'s cyst on the right.  2 D ECHO 11/30/14 Study Conclusions  - Left ventricle: The cavity size was mildly dilated. There was  mild concentric hypertrophy.  Systolic function was normal. The  estimated ejection fraction was in the range of 55% to 60%. Wall  motion was normal; there were no regional wall motion  abnormalities. Features are consistent with a pseudonormal left  ventricular filling pattern, with concomitant abnormal relaxation  and increased filling pressure (grade 2 diastolic dysfunction).  Doppler parameters are consistent with high ventricular filling  pressure. - Aortic valve: Trileaflet; normal thickness, mildly calcified  leaflets. - Pulmonary arteries: PA peak pressure: 49 mm Hg (S). - Pericardium, extracardiac: A trivial, free-flowing pericardial  effusion was identified circumferential to the heart. The fluid  had no internal echoes.  Consultations:  none  Discharge Exam: Filed Weights   12/03/15 0500 12/04/15 0455 12/05/15 0502  Weight: 77.429 kg (170 lb 11.2 oz) 76.3 kg (168 lb 3.4 oz) 75.796 kg (167 lb 1.6 oz)   Filed Vitals:   12/05/15 0502 12/05/15 0829  BP: 155/81 158/78  Pulse: 76 93  Temp: 98.4 F (36.9 C)   Resp: 16     General: AAO x 3, no distress Cardiovascular: RRR, no murmurs  Respiratory: clear to auscultation bilaterally GI: soft, non-tender, non-distended, bowel sound positive  Discharge Instructions You were cared for by a hospitalist during your hospital stay. If you have any questions about your discharge medications or the care you received while you were in the hospital after you are discharged, you can call the unit and asked to speak with the hospitalist on call if the hospitalist that took care of you is not available. Once you are discharged, your primary care physician will handle any further medical issues. Please note that NO REFILLS for any discharge medications will be authorized once you are discharged, as it is imperative that you return to your primary care physician (or establish a relationship with a primary care physician if you do not have one) for your aftercare  needs so that they can reassess your need for medications and monitor your lab values.      Discharge Instructions    Discharge instructions    Complete by:  As directed   INR in 3 days. Adjust dose of Coumadin based on INR. Diet: diabetic, heart healthy, renal     Increase activity slowly    Complete by:  As directed             Medication List    TAKE these medications        albuterol 108 (90 Base) MCG/ACT inhaler  Commonly known as:  PROVENTIL HFA;VENTOLIN HFA  Inhale 1-2 puffs into the lungs every 6 (six) hours as needed for wheezing or shortness of breath.     amLODipine 10 MG tablet  Commonly known as:  NORVASC  Take 10 mg by mouth daily.     aspirin EC 81 MG tablet  Take 81 mg by mouth daily.     atorvastatin 80 MG tablet  Commonly known as:  LIPITOR  Take 40 mg by mouth every evening.     benztropine 0.5 MG tablet  Commonly known as:  COGENTIN  Take  0.5 mg by mouth 2 (two) times daily.     bisacodyl 5 MG EC tablet  Generic drug:  bisacodyl  Take 5 mg by mouth daily as needed for moderate constipation.     bumetanide 2 MG tablet  Commonly known as:  BUMEX  Take 4 mg by mouth 2 (two) times daily.     carvedilol 25 MG tablet  Commonly known as:  COREG  Take 25 mg by mouth 2 (two) times daily with a meal.     darbepoetin 40 MCG/0.4ML Soln injection  Commonly known as:  ARANESP  Inject 40 mcg into the skin every 14 (fourteen) days. 1st and the 15th of each month.     fluPHENAZine 1 MG tablet  Commonly known as:  PROLIXIN  Take 1 mg by mouth 2 (two) times daily.     gabapentin 300 MG capsule  Commonly known as:  NEURONTIN  Take 300 mg by mouth daily.     hydrALAZINE 100 MG tablet  Commonly known as:  APRESOLINE  Take 100 mg by mouth 2 (two) times daily.     insulin aspart 100 UNIT/ML injection  Commonly known as:  novoLOG  Inject 7-15 Units into the skin 3 (three) times daily before meals. Sliding scale     insulin detemir 100 UNIT/ML injection   Commonly known as:  LEVEMIR  Inject 0.12 mLs (12 Units total) into the skin at bedtime.     isosorbide mononitrate 60 MG 24 hr tablet  Commonly known as:  IMDUR  Take 60 mg by mouth daily.     pantoprazole 20 MG tablet  Commonly known as:  PROTONIX  Take 20 mg by mouth daily.     QUEtiapine 100 MG tablet  Commonly known as:  SEROQUEL  Take 2 tablets (200 mg total) by mouth at bedtime.     traMADol 50 MG tablet  Commonly known as:  ULTRAM  Take 1 tablet (50 mg total) by mouth every 6 (six) hours as needed. Maximum dose= 8 tablets per day     warfarin 2 MG tablet  Commonly known as:  COUMADIN  Take 2 tablets (4 mg total) by mouth daily.       Allergies  Allergen Reactions  . Oxycodone Other (See Comments)    Makes patient lethargic, sleeping for few days, altered mental status      The results of significant diagnostics from this hospitalization (including imaging, microbiology, ancillary and laboratory) are listed below for reference.    Significant Diagnostic Studies: Dg Chest 2 View  11/29/2015  CLINICAL DATA:  Cough. Congestion. Shortness of breath, 2 weeks duration. EXAM: CHEST  2 VIEW COMPARISON:  01/20/2015 FINDINGS: Cardiomegaly. Mediastinal shadows are otherwise normal. Pulmonary venous hypertension with interstitial and early alveolar edema. Small effusions bilaterally. No acute bone finding. IMPRESSION: Congestive heart failure with cardiomegaly, venous hypertension, interstitial and early alveolar edema and small pleural effusions. Electronically Signed   By: Nelson Chimes M.D.   On: 11/29/2015 19:22    Microbiology: No results found for this or any previous visit (from the past 240 hour(s)).   Labs: Basic Metabolic Panel:  Recent Labs Lab 12/01/15 0201 12/02/15 0435 12/03/15 0525 12/04/15 0433 12/05/15 0529  NA 139 142 140 140 139  K 3.6 3.9 3.9 3.7 3.8  CL 110 111 107 107 105  CO2 22 24 24 25 25   GLUCOSE 78 81 140* 192* 137*  BUN 52* 54* 58* 66*  61*  CREATININE 4.05* 3.91* 3.98* 4.23*  3.72*  CALCIUM 8.8* 8.7* 8.7* 8.7* 9.0  MG  --   --  2.0  --   --    Liver Function Tests: No results for input(s): AST, ALT, ALKPHOS, BILITOT, PROT, ALBUMIN in the last 168 hours. No results for input(s): LIPASE, AMYLASE in the last 168 hours. No results for input(s): AMMONIA in the last 168 hours. CBC:  Recent Labs Lab 11/29/15 1837 12/01/15 0201 12/03/15 0525 12/04/15 0433 12/05/15 0529  WBC 7.6 6.4 7.3 9.3 10.3  HGB 8.7* 8.4* 9.0* 8.6* 9.2*  HCT 27.1* 25.6* 27.0* 26.4* 28.4*  MCV 95.8 94.5 93.8 95.7 95.0  PLT 234 228 258 260 275   Cardiac Enzymes:  Recent Labs Lab 11/29/15 2126 11/30/15 0232 11/30/15 0854  TROPONINI 0.03* 0.04* 0.03*   BNP: BNP (last 3 results)  Recent Labs  01/21/15 0100 07/16/15 1735 11/29/15 2132  BNP 1500.3* 322.3* 356.2*    ProBNP (last 3 results) No results for input(s): PROBNP in the last 8760 hours.  CBG:  Recent Labs Lab 12/04/15 0728 12/04/15 1147 12/04/15 1652 12/04/15 2142 12/05/15 0755  GLUCAP 140* 358* 86 360* 129*       SignedDebbe Odea, MD [facility] 12/05/2015, 10:14 AM

## 2015-12-05 NOTE — Progress Notes (Signed)
ANTICOAGULATION CONSULT NOTE - Follow-Up  Pharmacy Consult for heparin/warfarin Indication: DVT  Allergies  Allergen Reactions  . Oxycodone Other (See Comments)    Makes patient lethargic, sleeping for few days, altered mental status   Patient Measurements: Height: 5\' 4"  (162.6 cm) Weight: 167 lb 1.6 oz (75.796 kg) IBW/kg (Calculated) : 54.7 Heparin Dosing Weight: 73kg  Vital Signs: Temp: 98.4 F (36.9 C) (07/12 0502) Temp Source: Oral (07/12 0502) BP: 158/78 mmHg (07/12 0829) Pulse Rate: 93 (07/12 0829)  Labs:  Recent Labs  12/03/15 0525 12/04/15 0433 12/05/15 0529  HGB 9.0* 8.6* 9.2*  HCT 27.0* 26.4* 28.4*  PLT 258 260 275  LABPROT 17.4* 25.8* 25.6*  INR 1.41 2.39* 2.46*  HEPARINUNFRC 0.34 0.28* 0.33  CREATININE 3.98* 4.23* 3.72*   Estimated Creatinine Clearance: 15.4 mL/min (by C-G formula based on Cr of 3.72).  Medical History: Past Medical History  Diagnosis Date  . Coronary artery disease   . Bipolar disorder ([place])   . History of blood transfusion   . Benign hypertension   . Anemia   . Heart murmur   . CHF (congestive heart failure) ([place])   . Sleep apnea   . Shortness of breath dyspnea   . History of pneumonia   . History of bronchitis   . Depression   . Anxiety   . GERD (gastroesophageal reflux disease)   . Arthritis   . Diabetes mellitus without complication ([place])   . Type 2 diabetes mellitus ([place])   . Chronic kidney disease, stage V ([place])     Nephrologist is with [facility] (Dr. Tammi Klippel)   Assessment: 65 [name] presents with heart failure exacerbation and R LE swelling, venous duplex of R LE reveals DVT. She has CKD with CrCl < 47ml/min. Pharmacy to dose heparin gtt and warfarin.  Not a good candidate for newer anticoagulants due to poor renal function.  Today, 12/05/2015   Heparin level slightly below range 7/11,did not increase Heparin rate (large jump in INR 7/11), level remains therapeutic today at 0.33 units/ml  CBC: Hgb low, pltc WNL,  stable  Renal: SCr = 3.72 (close to baseline)   INR 2.46 this am, required ~ large doses to reach therapeutic range  No bleeding issues per RN  Pt taking aspirin 81mg  daily  Goal of Therapy:  Heparin level 0.3-0.7 units/ml Monitor platelets by anticoagulation protocol: Yes  INR 2-3   Plan:  Day #6 of overlap Heparin/Warfarin for acute VTE D 2/2 overlap with therapeutic INR  Can discontinue Heparin (infusion at 1200 units/hr)  Repeat Warfarin 2 mg today at 1800 to avoid overshoot of INR - anticipate patient will be covered by at least 5mg  Warfarin daily  Daily CBC, heparin level, and INR  Monitor for bleeding  Attempted Warfarin counseling x2 on 7/9 (patient asleep)  Discussed Warfarin 7/10 with patient, will reinforce teaching (spoke with daughter 7/11 - voiced concerns of comprehension by patient-she noted patient does not cook for herself so diet restrictions are not as concerning)  PCP noted as [facility], where will patient be seen for INR/Warfarin dosing (would suggest close coverage of INR/patient compliance & understanding)  Minda Ditto PharmD Pager [phone] 12/05/2015, 8:54 AM

## 2015-12-05 NOTE — Progress Notes (Signed)
Patient given discharge, follow up, and medication instructions, verbalized understanding, IV and telemetry removed, family to transport home

## 2015-12-05 NOTE — Progress Notes (Signed)
Pt's PCP is  Dr. Marsh Dolly at [facility] QB:[id number] [phone], fax [phone].

## 2015-12-05 NOTE — Discharge Instructions (Signed)
Heart Failure  Heart failure means your heart has trouble pumping blood. This makes it hard for your body to work well. Heart failure is usually a long-term (chronic) condition. You must take good care of yourself and follow your doctor's treatment plan.  HOME CARE   Take your heart medicine as told by your doctor.    Do not stop taking medicine unless your doctor tells you to.    Do not skip any dose of medicine.    Refill your medicines before they run out.    Take other medicines only as told by your doctor or pharmacist.   Stay active if told by your doctor. The elderly and people with severe heart failure should talk with a doctor about physical activity.   Eat heart-healthy foods. Choose foods that are without trans fat and are low in saturated fat, cholesterol, and salt (sodium). This includes fresh or frozen fruits and vegetables, fish, lean meats, fat-free or low-fat dairy foods, whole grains, and high-fiber foods. Lentils and dried peas and beans (legumes) are also good choices.   Limit salt if told by your doctor.   Cook in a healthy way. Roast, grill, broil, bake, poach, steam, or stir-fry foods.   Limit fluids as told by your doctor.   Weigh yourself every morning. Do this after you pee (urinate) and before you eat breakfast. Write down your weight to give to your doctor.   Take your blood pressure and write it down if your doctor tells you to.   Ask your doctor how to check your pulse. Check your pulse as told.   Lose weight if told by your doctor.   Stop smoking or chewing tobacco. Do not use gum or patches that help you quit without your doctor's approval.   Schedule and go to doctor visits as told.   Nonpregnant women should have no more than 1 drink a day. Men should have no more than 2 drinks a day. Talk to your doctor about drinking alcohol.   Stop illegal drug use.   Stay current with shots (immunizations).   Manage your health conditions as told by your doctor.   Learn to  manage your stress.   Rest when you are tired.   If it is really hot outside:    Avoid intense activities.    Use air conditioning or fans, or get in a cooler place.    Avoid caffeine and alcohol.    Wear loose-fitting, lightweight, and light-colored clothing.   If it is really cold outside:    Avoid intense activities.    Layer your clothing.    Wear mittens or gloves, a hat, and a scarf when going outside.    Avoid alcohol.   Learn about heart failure and get support as needed.   Get help to maintain or improve your quality of life and your ability to care for yourself as needed.  GET HELP IF:    You gain weight quickly.   You are more short of breath than usual.   You cannot do your normal activities.   You tire easily.   You cough more than normal, especially with activity.   You have any or more puffiness (swelling) in areas such as your hands, feet, ankles, or belly (abdomen).   You cannot sleep because it is hard to breathe.   You feel like your heart is beating fast (palpitations).   You get dizzy or light-headed when you stand up.  GET HELP   RIGHT AWAY IF:    You have trouble breathing.   There is a change in mental status, such as becoming less alert or not being able to focus.   You have chest pain or discomfort.   You faint.  MAKE SURE YOU:    Understand these instructions.   Will watch your condition.   Will get help right away if you are not doing well or get worse.     This information is not intended to replace advice given to you by your health care provider. Make sure you discuss any questions you have with your health care provider.     Document Released: 02/19/2008 Document Revised: 06/02/2014 Document Reviewed: 06/28/2012  Elsevier Interactive Patient Education 2016 [employer] Inc[employer]

## 2015-12-07 DIAGNOSIS — I82401 Acute embolism and thrombosis of unspecified deep veins of right lower extremity: Secondary | ICD-10-CM | POA: Diagnosis not present

## 2015-12-07 DIAGNOSIS — E1165 Type 2 diabetes mellitus with hyperglycemia: Secondary | ICD-10-CM | POA: Diagnosis not present

## 2015-12-07 DIAGNOSIS — N184 Chronic kidney disease, stage 4 (severe): Secondary | ICD-10-CM | POA: Diagnosis not present

## 2015-12-07 DIAGNOSIS — I5033 Acute on chronic diastolic (congestive) heart failure: Secondary | ICD-10-CM | POA: Diagnosis not present

## 2015-12-07 DIAGNOSIS — I130 Hypertensive heart and chronic kidney disease with heart failure and stage 1 through stage 4 chronic kidney disease, or unspecified chronic kidney disease: Secondary | ICD-10-CM | POA: Diagnosis not present

## 2015-12-07 DIAGNOSIS — E1129 Type 2 diabetes mellitus with other diabetic kidney complication: Secondary | ICD-10-CM | POA: Diagnosis not present

## 2015-12-11 DIAGNOSIS — I5033 Acute on chronic diastolic (congestive) heart failure: Secondary | ICD-10-CM | POA: Diagnosis not present

## 2015-12-11 DIAGNOSIS — I130 Hypertensive heart and chronic kidney disease with heart failure and stage 1 through stage 4 chronic kidney disease, or unspecified chronic kidney disease: Secondary | ICD-10-CM | POA: Diagnosis not present

## 2015-12-11 DIAGNOSIS — E1165 Type 2 diabetes mellitus with hyperglycemia: Secondary | ICD-10-CM | POA: Diagnosis not present

## 2015-12-11 DIAGNOSIS — N184 Chronic kidney disease, stage 4 (severe): Secondary | ICD-10-CM | POA: Diagnosis not present

## 2015-12-11 DIAGNOSIS — E1129 Type 2 diabetes mellitus with other diabetic kidney complication: Secondary | ICD-10-CM | POA: Diagnosis not present

## 2015-12-11 DIAGNOSIS — I82401 Acute embolism and thrombosis of unspecified deep veins of right lower extremity: Secondary | ICD-10-CM | POA: Diagnosis not present

## 2016-01-09 ENCOUNTER — Encounter (HOSPITAL_COMMUNITY): Payer: Self-pay | Admitting: Emergency Medicine

## 2016-01-09 ENCOUNTER — Ambulatory Visit (INDEPENDENT_AMBULATORY_CARE_PROVIDER_SITE_OTHER): Payer: Medicare Other

## 2016-01-09 ENCOUNTER — Ambulatory Visit (HOSPITAL_COMMUNITY)
Admission: EM | Admit: 2016-01-09 | Discharge: 2016-01-09 | Disposition: A | Discharge status: Home / Self Care | Payer: Medicare Other | Attending: Family Medicine | Admitting: Family Medicine

## 2016-01-09 DIAGNOSIS — M7989 Other specified soft tissue disorders: Secondary | ICD-10-CM

## 2016-01-09 MED ORDER — CEPHALEXIN 500 MG PO CAPS
500.0000 mg | ORAL_CAPSULE | Freq: Three times a day (TID) | ORAL | 0 refills | Status: DC
Start: 2016-01-09 — End: 2016-03-17

## 2016-01-09 NOTE — Discharge Instructions (Signed)
Please use the antibiotic as prescribed; follow up with your primary care provider if your arm does not improve.

## 2016-01-09 NOTE — ED Provider Notes (Signed)
CSN: [id number]     Arrival date & time 01/09/16  1142 History   First MD Initiated Contact with Patient 01/09/16 1319     Chief Complaint  Patient presents with  . Arm Pain   (Consider location/radiation/quality/duration/timing/severity/associated sxs/prior Treatment) Robin [name] is a 65 y.o female presents today for right forearm arm x 3 days. She woke up with this pain 3 days ago. Pain onset was sudden. She reports that the pain is worsening. She denies injury. She cannot recall any activity the night prior to the onset of pain that could potential cause this arm pain. She endorses localized swelling, numbness, tingling and weakness.     Arm Pain     Past Medical History:  Diagnosis Date  . Anemia   . Anxiety   . Arthritis   . Benign hypertension   . Bipolar disorder ([place])   . CHF (congestive heart failure) ([place])   . Chronic kidney disease, stage V [facility])    Nephrologist is with [facility] (Dr. Tammi Klippel)  . Coronary artery disease   . Depression   . Diabetes mellitus without complication ([place])   . GERD (gastroesophageal reflux disease)   . Heart murmur   . History of blood transfusion   . History of bronchitis   . History of pneumonia   . Shortness of breath dyspnea   . Sleep apnea   . Type 2 diabetes mellitus ([place])    Past Surgical History:  Procedure Laterality Date  . ABDOMINAL HYSTERECTOMY    . ANKLE CLOSED REDUCTION N/A 11/17/2014   Procedure: CLOSED REDUCTION ANKLE;  Surgeon: Earlie Server, MD;  Location: [place];  Service: Orthopedics;  Laterality: N/A;  . ANKLE FUSION Left 03/16/2015   Procedure: Left Tibiocalcaneal Fusion;  Surgeon: Newt Minion, MD;  Location: [place];  Service: Orthopedics;  Laterality: Left;  . AV FISTULA PLACEMENT Left [id number]   done at [place]     2 stent   . CHOLECYSTECTOMY    . CORONARY STENT PLACEMENT    . HARDWARE REMOVAL Left 03/16/2015   Procedure: Removal Hardware Left Ankle;  Surgeon: Newt Minion,  MD;  Location: [facility];  Service: Orthopedics;  Laterality: Left;  . ORIF ANKLE FRACTURE Left 11/20/2014   Procedure: OPEN REDUCTION INTERNAL FIXATION (ORIF) ANKLE FRACTURE;  Surgeon: Renette Butters, MD;  Location: [facility];  Service: Orthopedics;  Laterality: Left;  . TONSILLECTOMY     History reviewed. No pertinent family history. Social History  Substance Use Topics  . Smoking status: Current Every Day Smoker    Packs/day: 0.50    Types: Cigarettes  . Smokeless tobacco: Never Used  . Alcohol use No   OB History    No data available     Review of Systems  Musculoskeletal:       Positive for pain at right forearm  All other systems reviewed and are negative.   Allergies  Oxycodone  Home Medications   Prior to Admission medications   Medication Sig Start Date End Date Taking? Authorizing Provider  albuterol (PROVENTIL HFA;VENTOLIN HFA) 108 (90 BASE) MCG/ACT inhaler Inhale 1-2 puffs into the lungs every 6 (six) hours as needed for wheezing or shortness of breath.   Yes Historical Provider, MD  amLODipine (NORVASC) 10 MG tablet Take 10 mg by mouth daily.   Yes Historical Provider, MD  aspirin EC 81 MG tablet Take 81 mg by mouth daily.   Yes Historical Provider, MD  atorvastatin (LIPITOR) 80  MG tablet Take 40 mg by mouth every evening.   Yes Historical Provider, MD  benztropine (COGENTIN) 0.5 MG tablet Take 0.5 mg by mouth 2 (two) times daily.   Yes Historical Provider, MD  bisacodyl (BISACODYL) 5 MG EC tablet Take 5 mg by mouth daily as needed for moderate constipation.   Yes Historical Provider, MD  bumetanide (BUMEX) 2 MG tablet Take 4 mg by mouth 2 (two) times daily.    Yes Historical Provider, MD  carvedilol (COREG) 25 MG tablet Take 25 mg by mouth 2 (two) times daily with a meal.   Yes Historical Provider, MD  darbepoetin (ARANESP) 40 MCG/0.4ML SOLN injection Inject 40 mcg into the skin every 14 (fourteen) days. 1st and the 15th of each month.   Yes Historical Provider, MD   fluPHENAZine (PROLIXIN) 1 MG tablet Take 1 mg by mouth 2 (two) times daily.    Yes Historical Provider, MD  gabapentin (NEURONTIN) 300 MG capsule Take 300 mg by mouth daily.    Yes Historical Provider, MD  hydrALAZINE (APRESOLINE) 100 MG tablet Take 100 mg by mouth 2 (two) times daily.   Yes Historical Provider, MD  insulin aspart (NOVOLOG) 100 UNIT/ML injection Inject 7-15 Units into the skin 3 (three) times daily before meals. Sliding scale   Yes Historical Provider, MD  insulin detemir (LEVEMIR) 100 UNIT/ML injection Inject 0.12 mLs (12 Units total) into the skin at bedtime. Patient taking differently: Inject 30-40 Units into the skin See admin instructions. Inject 40 units every morning and 30 units every night. 11/22/14  Yes Bonnielee Haff, MD  isosorbide mononitrate (IMDUR) 60 MG 24 hr tablet Take 60 mg by mouth daily.   Yes Historical Provider, MD  pantoprazole (PROTONIX) 20 MG tablet Take 20 mg by mouth daily.   Yes Historical Provider, MD  QUEtiapine (SEROQUEL) 100 MG tablet Take 2 tablets (200 mg total) by mouth at bedtime. Patient taking differently: Take 400 mg by mouth at bedtime.  11/22/14  Yes Bonnielee Haff, MD  traMADol (ULTRAM) 50 MG tablet Take 1 tablet (50 mg total) by mouth every 6 (six) hours as needed. Maximum dose= 8 tablets per day 03/19/15  Yes Newt Minion, MD  warfarin (COUMADIN) 2 MG tablet Take 2 tablets (4 mg total) by mouth daily. 12/05/15  Yes Debbe Odea, MD  cephALEXin (KEFLEX) 500 MG capsule Take 1 capsule (500 mg total) by mouth 3 (three) times daily. 01/09/16   Barry Dienes, NP   Meds Ordered and Administered this Visit  Medications - No data to display  BP 139/68 (BP Location: Right Arm)   Pulse 90   Temp 98.5 F (36.9 C) (Oral)   Resp 20   SpO2 100%  No data found.   Physical Exam  Constitutional: She appears well-developed and well-nourished.  Cardiovascular: Normal rate and regular rhythm.   Pulmonary/Chest: Effort normal and breath sounds normal.   Abdominal: Soft. Bowel sounds are normal.  Musculoskeletal:  Localized swelling noted at right dorsal forearm, tender on palpation.   Skin:  Localized swelling noted at right dorsal orearm, difficult to assess for erythema due to fitzpatrick skin type.  There is a round hyperpigmented spot at the center of the swelling, this particular spot is particularly tender on touch.   Nursing note and vitals reviewed.   Urgent Care Course   Clinical Course    Procedures (including critical care time)  Labs Review Labs Reviewed - No data to display  Imaging Review Dg Forearm Right  Result  Date: 01/09/2016 CLINICAL DATA:  Mid forearm pain and swelling since 01/07/2016 EXAM: RIGHT FOREARM - 2 VIEW COMPARISON:  None. FINDINGS: Dorsal mid forearm soft tissue swelling. No fracture or opaque foreign body. No malalignment. IMPRESSION: Dorsal forearm swelling without acute osseous finding or opaque foreign body. Electronically Signed   By: Monte Fantasia M.D.   On: 01/09/2016 13:39     Visual Acuity Review  Right Eye Distance:   Left Eye Distance:   Bilateral Distance:    Right Eye Near:   Left Eye Near:    Bilateral Near:         MDM   1. Arm swelling    Robin [name] is a 65 y.o afebrile female presents today for right forearm arm x 3 days. She woke up with this pain 3 days ago. She denies injury. Xray  revealed dorsal forearm swelling without acute osseous finding. There is a round hyperpigmented spot at the center of the swelling, this particular spot is particularly tender on touch. I highly suspect for insect bite. Keflex rx given for TID x 5 days. Instructed to put ice on the swelling. Instructed to f/u with primary care provider if swelling or pain does not improve.     Barry Dienes, NP 01/09/16 1420

## 2016-01-09 NOTE — ED Triage Notes (Signed)
The patient presented to the [name] with a complaint of right arm pain and swelling that started 34 days ago upon waking. The patient denied any known injury.

## 2016-03-13 ENCOUNTER — Emergency Department (HOSPITAL_COMMUNITY): Payer: Medicare Other

## 2016-03-13 ENCOUNTER — Inpatient Hospital Stay (HOSPITAL_COMMUNITY)
Admission: EM | Admit: 2016-03-13 | Discharge: 2016-03-17 | DRG: 193 | Disposition: A | Discharge status: Home / Self Care | Payer: Medicare Other | Attending: Internal Medicine | Admitting: Internal Medicine

## 2016-03-13 ENCOUNTER — Encounter (HOSPITAL_COMMUNITY): Payer: Self-pay | Admitting: Emergency Medicine

## 2016-03-13 DIAGNOSIS — J181 Lobar pneumonia, unspecified organism: Secondary | ICD-10-CM | POA: Diagnosis not present

## 2016-03-13 DIAGNOSIS — I132 Hypertensive heart and chronic kidney disease with heart failure and with stage 5 chronic kidney disease, or end stage renal disease: Secondary | ICD-10-CM | POA: Diagnosis present

## 2016-03-13 DIAGNOSIS — Z7982 Long term (current) use of aspirin: Secondary | ICD-10-CM | POA: Diagnosis not present

## 2016-03-13 DIAGNOSIS — Z79891 Long term (current) use of opiate analgesic: Secondary | ICD-10-CM

## 2016-03-13 DIAGNOSIS — R410 Disorientation, unspecified: Secondary | ICD-10-CM

## 2016-03-13 DIAGNOSIS — E876 Hypokalemia: Secondary | ICD-10-CM | POA: Diagnosis not present

## 2016-03-13 DIAGNOSIS — Z79899 Other long term (current) drug therapy: Secondary | ICD-10-CM

## 2016-03-13 DIAGNOSIS — N184 Chronic kidney disease, stage 4 (severe): Secondary | ICD-10-CM | POA: Diagnosis not present

## 2016-03-13 DIAGNOSIS — E669 Obesity, unspecified: Secondary | ICD-10-CM | POA: Diagnosis present

## 2016-03-13 DIAGNOSIS — E1165 Type 2 diabetes mellitus with hyperglycemia: Secondary | ICD-10-CM | POA: Diagnosis present

## 2016-03-13 DIAGNOSIS — G4730 Sleep apnea, unspecified: Secondary | ICD-10-CM | POA: Diagnosis present

## 2016-03-13 DIAGNOSIS — F319 Bipolar disorder, unspecified: Secondary | ICD-10-CM | POA: Diagnosis present

## 2016-03-13 DIAGNOSIS — R05 Cough: Secondary | ICD-10-CM | POA: Diagnosis not present

## 2016-03-13 DIAGNOSIS — R4182 Altered mental status, unspecified: Secondary | ICD-10-CM

## 2016-03-13 DIAGNOSIS — I2510 Atherosclerotic heart disease of native coronary artery without angina pectoris: Secondary | ICD-10-CM | POA: Diagnosis present

## 2016-03-13 DIAGNOSIS — F17210 Nicotine dependence, cigarettes, uncomplicated: Secondary | ICD-10-CM | POA: Diagnosis present

## 2016-03-13 DIAGNOSIS — Z86718 Personal history of other venous thrombosis and embolism: Secondary | ICD-10-CM

## 2016-03-13 DIAGNOSIS — J9601 Acute respiratory failure with hypoxia: Secondary | ICD-10-CM | POA: Diagnosis present

## 2016-03-13 DIAGNOSIS — I5042 Chronic combined systolic (congestive) and diastolic (congestive) heart failure: Secondary | ICD-10-CM | POA: Diagnosis present

## 2016-03-13 DIAGNOSIS — Z9049 Acquired absence of other specified parts of digestive tract: Secondary | ICD-10-CM | POA: Diagnosis not present

## 2016-03-13 DIAGNOSIS — R0902 Hypoxemia: Secondary | ICD-10-CM | POA: Diagnosis not present

## 2016-03-13 DIAGNOSIS — Z794 Long term (current) use of insulin: Secondary | ICD-10-CM | POA: Diagnosis not present

## 2016-03-13 DIAGNOSIS — D631 Anemia in chronic kidney disease: Secondary | ICD-10-CM | POA: Diagnosis present

## 2016-03-13 DIAGNOSIS — G9340 Encephalopathy, unspecified: Secondary | ICD-10-CM | POA: Diagnosis present

## 2016-03-13 DIAGNOSIS — E11649 Type 2 diabetes mellitus with hypoglycemia without coma: Secondary | ICD-10-CM | POA: Diagnosis present

## 2016-03-13 DIAGNOSIS — E119 Type 2 diabetes mellitus without complications: Secondary | ICD-10-CM | POA: Diagnosis present

## 2016-03-13 DIAGNOSIS — E1122 Type 2 diabetes mellitus with diabetic chronic kidney disease: Secondary | ICD-10-CM | POA: Diagnosis not present

## 2016-03-13 DIAGNOSIS — Z7901 Long term (current) use of anticoagulants: Secondary | ICD-10-CM

## 2016-03-13 DIAGNOSIS — N189 Chronic kidney disease, unspecified: Secondary | ICD-10-CM

## 2016-03-13 DIAGNOSIS — I82401 Acute embolism and thrombosis of unspecified deep veins of right lower extremity: Secondary | ICD-10-CM | POA: Diagnosis present

## 2016-03-13 DIAGNOSIS — Z6829 Body mass index (BMI) 29.0-29.9, adult: Secondary | ICD-10-CM

## 2016-03-13 DIAGNOSIS — J189 Pneumonia, unspecified organism: Principal | ICD-10-CM | POA: Diagnosis present

## 2016-03-13 DIAGNOSIS — K219 Gastro-esophageal reflux disease without esophagitis: Secondary | ICD-10-CM | POA: Diagnosis present

## 2016-03-13 DIAGNOSIS — Z885 Allergy status to narcotic agent status: Secondary | ICD-10-CM

## 2016-03-13 LAB — CBC WITH DIFFERENTIAL/PLATELET
BASOS ABS: 0 10*3/uL (ref 0.0–0.1)
BASOS PCT: 0 %
EOS ABS: 0.1 10*3/uL (ref 0.0–0.7)
EOS PCT: 2 %
HCT: 29 % — ABNORMAL LOW (ref 36.0–46.0)
Hemoglobin: 9.4 g/dL — ABNORMAL LOW (ref 12.0–15.0)
Lymphocytes Relative: 12 %
Lymphs Abs: 1.1 10*3/uL (ref 0.7–4.0)
MCH: 29.8 pg (ref 26.0–34.0)
MCHC: 32.4 g/dL (ref 30.0–36.0)
MCV: 92.1 fL (ref 78.0–100.0)
MONO ABS: 0.9 10*3/uL (ref 0.1–1.0)
Monocytes Relative: 10 %
Neutro Abs: 6.7 10*3/uL (ref 1.7–7.7)
Neutrophils Relative %: 76 %
PLATELETS: 236 10*3/uL (ref 150–400)
RBC: 3.15 MIL/uL — AB (ref 3.87–5.11)
RDW: 15.2 % (ref 11.5–15.5)
WBC: 8.8 10*3/uL (ref 4.0–10.5)

## 2016-03-13 LAB — URINE MICROSCOPIC-ADD ON: RBC / HPF: NONE SEEN RBC/hpf (ref 0–5)

## 2016-03-13 LAB — COMPREHENSIVE METABOLIC PANEL
ALBUMIN: 3.4 g/dL — AB (ref 3.5–5.0)
ALK PHOS: 89 U/L (ref 38–126)
ALT: 14 U/L (ref 14–54)
ANION GAP: 11 (ref 5–15)
AST: 17 U/L (ref 15–41)
BILIRUBIN TOTAL: 0.8 mg/dL (ref 0.3–1.2)
BUN: 55 mg/dL — ABNORMAL HIGH (ref 6–20)
CALCIUM: 9 mg/dL (ref 8.9–10.3)
CO2: 23 mmol/L (ref 22–32)
Chloride: 101 mmol/L (ref 101–111)
Creatinine, Ser: 4.32 mg/dL — ABNORMAL HIGH (ref 0.44–1.00)
GFR calc non Af Amer: 10 mL/min — ABNORMAL LOW (ref >60)
GFR, EST AFRICAN AMERICAN: 12 mL/min — AB (ref >60)
Glucose, Bld: 224 mg/dL — ABNORMAL HIGH (ref 65–99)
POTASSIUM: 3.3 mmol/L — AB (ref 3.5–5.1)
Sodium: 135 mmol/L (ref 135–145)
TOTAL PROTEIN: 7.6 g/dL (ref 6.5–8.1)

## 2016-03-13 LAB — URINALYSIS, ROUTINE W REFLEX MICROSCOPIC
Bilirubin Urine: NEGATIVE
GLUCOSE, UA: 250 mg/dL — AB
Hgb urine dipstick: NEGATIVE
KETONES UR: NEGATIVE mg/dL
LEUKOCYTES UA: NEGATIVE
NITRITE: NEGATIVE
PROTEIN: 100 mg/dL — AB
Specific Gravity, Urine: 1.016 (ref 1.005–1.030)
pH: 5.5 (ref 5.0–8.0)

## 2016-03-13 LAB — PROTIME-INR
INR: 3.18
PROTHROMBIN TIME: 33.3 s — AB (ref 11.4–15.2)

## 2016-03-13 LAB — GLUCOSE, CAPILLARY
GLUCOSE-CAPILLARY: 272 mg/dL — AB (ref 65–99)
Glucose-Capillary: 242 mg/dL — ABNORMAL HIGH (ref 65–99)

## 2016-03-13 LAB — I-STAT BETA HCG BLOOD, ED (MC, WL, AP ONLY)

## 2016-03-13 LAB — I-STAT CG4 LACTIC ACID, ED
LACTIC ACID, VENOUS: 0.74 mmol/L (ref 0.5–1.9)
Lactic Acid, Venous: 1.02 mmol/L (ref 0.5–1.9)

## 2016-03-13 LAB — CBG MONITORING, ED: GLUCOSE-CAPILLARY: 229 mg/dL — AB (ref 65–99)

## 2016-03-13 LAB — BRAIN NATRIURETIC PEPTIDE: B Natriuretic Peptide: 626.8 pg/mL — ABNORMAL HIGH (ref 0.0–100.0)

## 2016-03-13 MED ORDER — VANCOMYCIN HCL IN DEXTROSE 1-5 GM/200ML-% IV SOLN: 1000.0000 mg | INTRAVENOUS | Status: DC

## 2016-03-13 MED ORDER — BISACODYL 5 MG PO TBEC
5.0000 mg | DELAYED_RELEASE_TABLET | Freq: Every day | ORAL | Status: DC
  Administered 2016-03-13 – 2016-03-17 (×5): 5 mg via ORAL
  Filled 2016-03-13 (×5): qty 1

## 2016-03-13 MED ORDER — PIPERACILLIN-TAZOBACTAM IN DEX 2-0.25 GM/50ML IV SOLN
2.2500 g | Freq: Four times a day (QID) | INTRAVENOUS | Status: DC
  Filled 2016-03-13: qty 50

## 2016-03-13 MED ORDER — DEXTROSE 5 % IV SOLN
1.0000 g | INTRAVENOUS | Status: DC
  Administered 2016-03-13 – 2016-03-15 (×3): 1 g via INTRAVENOUS
  Filled 2016-03-13 (×3): qty 10

## 2016-03-13 MED ORDER — FLUPHENAZINE HCL 1 MG PO TABS
1.0000 mg | ORAL_TABLET | Freq: Every day | ORAL | Status: DC
  Administered 2016-03-13 – 2016-03-15 (×3): 1 mg via ORAL
  Filled 2016-03-13 (×5): qty 1

## 2016-03-13 MED ORDER — ALBUTEROL SULFATE (2.5 MG/3ML) 0.083% IN NEBU
2.5000 mg | INHALATION_SOLUTION | Freq: Four times a day (QID) | RESPIRATORY_TRACT | Status: DC
  Administered 2016-03-13: 2.5 mg via RESPIRATORY_TRACT
  Filled 2016-03-13: qty 3

## 2016-03-13 MED ORDER — FERROUS SULFATE 325 (65 FE) MG PO TABS
325.0000 mg | ORAL_TABLET | Freq: Two times a day (BID) | ORAL | Status: DC
  Administered 2016-03-14 – 2016-03-17 (×6): 325 mg via ORAL
  Filled 2016-03-13 (×6): qty 1

## 2016-03-13 MED ORDER — SODIUM CHLORIDE 0.9 % IV SOLN: 250.0000 mL | INTRAVENOUS | Status: DC | PRN

## 2016-03-13 MED ORDER — ACETAMINOPHEN 325 MG PO TABS: 650.0000 mg | ORAL_TABLET | Freq: Four times a day (QID) | ORAL | Status: DC | PRN

## 2016-03-13 MED ORDER — GABAPENTIN 300 MG PO CAPS
300.0000 mg | ORAL_CAPSULE | Freq: Every day | ORAL | Status: DC
  Administered 2016-03-14 – 2016-03-17 (×4): 300 mg via ORAL
  Filled 2016-03-13 (×4): qty 1

## 2016-03-13 MED ORDER — ACETAMINOPHEN 650 MG RE SUPP: 650.0000 mg | Freq: Four times a day (QID) | RECTAL | Status: DC | PRN

## 2016-03-13 MED ORDER — CARVEDILOL 25 MG PO TABS
25.0000 mg | ORAL_TABLET | Freq: Two times a day (BID) | ORAL | Status: DC
  Administered 2016-03-13 – 2016-03-17 (×7): 25 mg via ORAL
  Filled 2016-03-13 (×7): qty 1

## 2016-03-13 MED ORDER — INSULIN ASPART 100 UNIT/ML ~~LOC~~ SOLN
0.0000 [IU] | Freq: Three times a day (TID) | SUBCUTANEOUS | Status: DC
  Administered 2016-03-14 (×2): 2 [IU] via SUBCUTANEOUS
  Administered 2016-03-15: 3 [IU] via SUBCUTANEOUS
  Administered 2016-03-15: 2 [IU] via SUBCUTANEOUS
  Administered 2016-03-16 – 2016-03-17 (×3): 5 [IU] via SUBCUTANEOUS
  Administered 2016-03-17: 2 [IU] via SUBCUTANEOUS

## 2016-03-13 MED ORDER — ALBUTEROL SULFATE (2.5 MG/3ML) 0.083% IN NEBU: 2.5000 mg | INHALATION_SOLUTION | RESPIRATORY_TRACT | Status: DC | PRN

## 2016-03-13 MED ORDER — AZITHROMYCIN 500 MG IV SOLR
500.0000 mg | INTRAVENOUS | Status: DC
  Administered 2016-03-13 – 2016-03-15 (×3): 500 mg via INTRAVENOUS
  Filled 2016-03-13 (×3): qty 500

## 2016-03-13 MED ORDER — VANCOMYCIN HCL IN DEXTROSE 1-5 GM/200ML-% IV SOLN
1000.0000 mg | Freq: Once | INTRAVENOUS | Status: AC
  Administered 2016-03-13: 1000 mg via INTRAVENOUS
  Filled 2016-03-13: qty 200

## 2016-03-13 MED ORDER — AMLODIPINE BESYLATE 10 MG PO TABS
10.0000 mg | ORAL_TABLET | Freq: Every day | ORAL | Status: DC
  Administered 2016-03-14 – 2016-03-17 (×4): 10 mg via ORAL
  Filled 2016-03-13 (×4): qty 1

## 2016-03-13 MED ORDER — ASPIRIN EC 81 MG PO TBEC
81.0000 mg | DELAYED_RELEASE_TABLET | Freq: Every day | ORAL | Status: DC
  Administered 2016-03-13 – 2016-03-17 (×5): 81 mg via ORAL
  Filled 2016-03-13 (×5): qty 1

## 2016-03-13 MED ORDER — TRAMADOL HCL 50 MG PO TABS
50.0000 mg | ORAL_TABLET | Freq: Four times a day (QID) | ORAL | Status: DC | PRN
  Administered 2016-03-16: 50 mg via ORAL
  Filled 2016-03-13: qty 1

## 2016-03-13 MED ORDER — BENZTROPINE MESYLATE 0.5 MG PO TABS
0.5000 mg | ORAL_TABLET | Freq: Two times a day (BID) | ORAL | Status: DC
  Administered 2016-03-13 – 2016-03-17 (×7): 0.5 mg via ORAL
  Filled 2016-03-13 (×9): qty 1

## 2016-03-13 MED ORDER — SODIUM CHLORIDE 0.9 % IV SOLN
1000.0000 mL | INTRAVENOUS | Status: DC
  Administered 2016-03-13: 1000 mL via INTRAVENOUS

## 2016-03-13 MED ORDER — PIPERACILLIN-TAZOBACTAM 3.375 G IVPB 30 MIN
3.3750 g | Freq: Once | INTRAVENOUS | Status: AC
  Administered 2016-03-13: 3.375 g via INTRAVENOUS
  Filled 2016-03-13: qty 50

## 2016-03-13 MED ORDER — QUETIAPINE FUMARATE 25 MG PO TABS
200.0000 mg | ORAL_TABLET | Freq: Every day | ORAL | Status: DC
  Administered 2016-03-13 – 2016-03-15 (×3): 200 mg via ORAL
  Filled 2016-03-13: qty 8
  Filled 2016-03-13 (×4): qty 4

## 2016-03-13 MED ORDER — IPRATROPIUM BROMIDE 0.02 % IN SOLN
0.5000 mg | Freq: Four times a day (QID) | RESPIRATORY_TRACT | Status: DC
  Administered 2016-03-13: 0.5 mg via RESPIRATORY_TRACT
  Filled 2016-03-13: qty 2.5

## 2016-03-13 MED ORDER — INSULIN DETEMIR 100 UNIT/ML ~~LOC~~ SOLN
12.0000 [IU] | Freq: Every day | SUBCUTANEOUS | Status: DC
  Administered 2016-03-13 – 2016-03-14 (×2): 12 [IU] via SUBCUTANEOUS
  Filled 2016-03-13 (×2): qty 0.12

## 2016-03-13 MED ORDER — FUROSEMIDE 10 MG/ML IJ SOLN
80.0000 mg | Freq: Once | INTRAMUSCULAR | Status: AC
  Administered 2016-03-13: 80 mg via INTRAVENOUS
  Filled 2016-03-13: qty 8

## 2016-03-13 MED ORDER — PANTOPRAZOLE SODIUM 20 MG PO TBEC
20.0000 mg | DELAYED_RELEASE_TABLET | Freq: Two times a day (BID) | ORAL | Status: DC
  Administered 2016-03-13 – 2016-03-17 (×7): 20 mg via ORAL
  Filled 2016-03-13 (×9): qty 1

## 2016-03-13 MED ORDER — IPRATROPIUM-ALBUTEROL 0.5-2.5 (3) MG/3ML IN SOLN
3.0000 mL | Freq: Three times a day (TID) | RESPIRATORY_TRACT | Status: DC
  Administered 2016-03-14: 3 mL via RESPIRATORY_TRACT
  Filled 2016-03-13: qty 3

## 2016-03-13 MED ORDER — ONDANSETRON HCL 4 MG/2ML IJ SOLN: 4.0000 mg | Freq: Four times a day (QID) | INTRAMUSCULAR | Status: DC | PRN

## 2016-03-13 MED ORDER — ISOSORBIDE MONONITRATE ER 30 MG PO TB24
60.0000 mg | ORAL_TABLET | Freq: Every day | ORAL | Status: DC
Start: 2016-03-14 — End: 2016-03-17
  Administered 2016-03-14 – 2016-03-17 (×4): 60 mg via ORAL
  Filled 2016-03-13 (×4): qty 2

## 2016-03-13 MED ORDER — SODIUM CHLORIDE 0.9% FLUSH
3.0000 mL | Freq: Two times a day (BID) | INTRAVENOUS | Status: DC
  Administered 2016-03-13 – 2016-03-16 (×6): 3 mL via INTRAVENOUS

## 2016-03-13 MED ORDER — ONDANSETRON HCL 4 MG PO TABS: 4.0000 mg | ORAL_TABLET | Freq: Four times a day (QID) | ORAL | Status: DC | PRN

## 2016-03-13 MED ORDER — SODIUM CHLORIDE 0.9% FLUSH: 3.0000 mL | INTRAVENOUS | Status: DC | PRN

## 2016-03-13 MED ORDER — HYDRALAZINE HCL 50 MG PO TABS
100.0000 mg | ORAL_TABLET | Freq: Two times a day (BID) | ORAL | Status: DC
  Administered 2016-03-13 – 2016-03-17 (×7): 100 mg via ORAL
  Filled 2016-03-13 (×8): qty 2

## 2016-03-13 MED ORDER — BUMETANIDE 0.5 MG PO TABS
4.0000 mg | ORAL_TABLET | Freq: Two times a day (BID) | ORAL | Status: DC
  Administered 2016-03-14 – 2016-03-17 (×6): 4 mg via ORAL
  Filled 2016-03-13: qty 8
  Filled 2016-03-13 (×6): qty 2

## 2016-03-13 MED ORDER — ATORVASTATIN CALCIUM 40 MG PO TABS
40.0000 mg | ORAL_TABLET | Freq: Every evening | ORAL | Status: DC
  Administered 2016-03-15 – 2016-03-16 (×2): 40 mg via ORAL
  Filled 2016-03-13: qty 1
  Filled 2016-03-13: qty 2

## 2016-03-13 NOTE — Progress Notes (Addendum)
[place] for Warfarin Indication: DVT  Allergies  Allergen Reactions  . Oxycodone Other (See Comments)    Makes patient lethargic, sleeping for few days, altered mental status   Patient Measurements: Height: 5\' 4"  (162.6 cm) Weight: 170 lb (77.1 kg) IBW/kg (Calculated) : 54.7  Vital Signs: Temp: 100.5 F (38.1 C) (10/19 1421) Temp Source: Oral (10/19 1421) BP: 153/71 (10/19 1410) Pulse Rate: 79 (10/19 1421)  Labs:  Recent Labs  03/13/16 1426 03/13/16 1500  HGB 9.4*  --   HCT 29.0*  --   PLT 236  --   LABPROT  --  33.3*  INR  --  3.18  CREATININE 4.32*  --    Estimated Creatinine Clearance: 13.4 mL/min (by C-G formula based on SCr of 4.32 mg/dL (H)).  Medical History: Past Medical History:  Diagnosis Date  . Anemia   . Anxiety   . Arthritis   . Benign hypertension   . Bipolar disorder ([place])   . CHF (congestive heart failure) ([place])   . Chronic kidney disease, stage V [facility])    Nephrologist is with [facility] (Dr. Tammi Klippel)  . Coronary artery disease   . Depression   . Diabetes mellitus without complication ([place])   . GERD (gastroesophageal reflux disease)   . Heart murmur   . History of blood transfusion   . History of bronchitis   . History of pneumonia   . Shortness of breath dyspnea   . Sleep apnea   . Type 2 diabetes mellitus ([facility])    Medications:  Scheduled:  . albuterol  2.5 mg Nebulization Q6H  . [START ON 03/14/2016] amLODipine  10 mg Oral Daily  . aspirin EC  81 mg Oral Daily  . [START ON 03/14/2016] atorvastatin  40 mg Oral QPM  . azithromycin  500 mg Intravenous Q24H  . benztropine  0.5 mg Oral BID  . bisacodyl  5 mg Oral Daily  . [START ON 03/14/2016] bumetanide  4 mg Oral BID  . carvedilol  25 mg Oral BID WC  . cefTRIAXone (ROCEPHIN)  IV  1 g Intravenous Q24H  . [START ON 03/14/2016] ferrous sulfate  325 mg Oral BID WC  . fluPHENAZine  1 mg Oral QHS  . [START ON 03/14/2016] gabapentin  300 mg  Oral Daily  . hydrALAZINE  100 mg Oral BID  . [START ON 03/14/2016] insulin aspart  0-9 Units Subcutaneous TID WC  . insulin detemir  12 Units Subcutaneous QHS  . ipratropium  0.5 mg Nebulization Q6H  . [START ON 03/14/2016] isosorbide mononitrate  60 mg Oral Q breakfast  . pantoprazole  20 mg Oral BID  . QUEtiapine  200 mg Oral QHS  . sodium chloride flush  3 mL Intravenous Q12H   Assessment: 65 [name] to ED with SHOB, cough, vomiting. PMH: HTN, CAD, CHF, OHS, DM, Bipolar disorder, CKD stage 4. Vanc & Zosyn per Pharmacy; rule out sepsis. Antibiotics changed to Ceftriaxone/Azithromycin for CAP  - Chronic Warfarin for hx of DVT. Home dose stated 7.5mg  daily exc 5mg  Tu & Th - Previous script for Warfarin 4mg  daily, patient to bring in prescription bottles to verify meds - Last dose 10/18 admit INR 3.18 - Antibiotics may increase INR  Goal of Therapy:  INR 2-3 Monitor platelets by anticoagulation protocol: Yes   Plan:   No Warfarin today, INR elevated  Daily Protime/INR  Minda Ditto PharmD Pager [phone] 03/13/2016, 6:21 PM

## 2016-03-13 NOTE — ED Notes (Signed)
Report given to [facility]-transport to 6 th floor

## 2016-03-13 NOTE — Progress Notes (Signed)
Pharmacy Antibiotic Follow-up Note  Robin [name] is a 65 y.o. year-old female admitted on 03/13/2016.  The patient is currently on day 1  of Vancomycin & Zosyn for rule out sepsis 65 [name] to ED with [facility], cough, vomiting. PMH: HTN, CAD, CHF, OHS, DM, Bipolar disorder, CKD stage 4. Vanc & Zosyn; rule out sepsis. Chronic Warfarin for hx of DVT.  Assessment/Plan: Vancomycin 1gm in ED, then 1gm IV every 48 hours.  Goal trough 15-20 mcg/mL.  Zosyn 3.375gm x1 in ED, then reduce to 2.25gm q6hr  Temp (24hrs), Avg:100.1 F (37.8 C), Min:99.6 F (37.6 C), Max:100.5 F (38.1 C)  No results for input(s): WBC in the last 168 hours.  Invalid input(s):  CREATININE No results for input(s): CREATININE in the last 168 hours. CrCl cannot be calculated (Patient's most recent lab result is older than the maximum 21 days allowed.).    Allergies  Allergen Reactions  . Oxycodone Other (See Comments)    Makes patient lethargic, sleeping for few days, altered mental status   Antimicrobials this admission: 1019 Vancomycin  >>  10/19 Zosyn >>   Levels/dose changes this admission:  Microbiology results: 10/19 BCx: sent 10/19 UCx: sent   Thank you for allowing pharmacy to be a part of this patient's care.  Minda Ditto PharmD 03/13/2016 2:41 PM

## 2016-03-13 NOTE — Progress Notes (Signed)
Pt with medicare and VA benefits Reports being seen by [facility] providers Pt with female visitor at bedside  EDP visiting pt

## 2016-03-13 NOTE — ED Notes (Signed)
Off floor for testing 

## 2016-03-13 NOTE — ED Provider Notes (Signed)
[facility] DEPT Provider Note   CSN: [id number] Arrival date & time: 03/13/16  1402     History   Chief Complaint Chief Complaint  Patient presents with  . Cough  . Altered Mental Status    HPI Robin [name] is a 65 y.o. female.  Patient lives with daughter they have a Musician that help her at home. Daughter states that the patient has been confused more so than usual for the past 3 days. Also has had a cough and had a fever. No nausea vomiting or diarrhea. No apparent pain. Patient has an AV fistula left arm has however has not started dialysis yet. Patient says history of congestive heart failure. Patient supposedly has a history of a DVT and is on Coumadin. Upon arrival patient noted to be hypoxic patient not normally on oxygen. Start on 2 L.      Past Medical History:  Diagnosis Date  . Anemia   . Anxiety   . Arthritis   . Benign hypertension   . Bipolar disorder ([place])   . CHF (congestive heart failure) ([place])   . Chronic kidney disease, stage V [facility])    Nephrologist is with [facility]- (Dr. Tammi Klippel)  . Coronary artery disease   . Depression   . Diabetes mellitus without complication ([place])   . GERD (gastroesophageal reflux disease)   . Heart murmur   . History of blood transfusion   . History of bronchitis   . History of pneumonia   . Shortness of breath dyspnea   . Sleep apnea   . Type 2 diabetes mellitus [facility])     Patient Active Problem List   Diagnosis Date Noted  . Right leg DVT ([place]) 12/05/2015  . Acute respiratory failure with hypoxemia ([place])   . Hyperlipidemia 03/26/2015  . Charcot ankle 03/16/2015  . Confusion 01/21/2015  . Anemia 01/21/2015  . Multiple falls 01/21/2015  . Acute on chronic diastolic heart failure ([place]) 01/21/2015  . Anemia, chronic renal failure   . Ventricular tachycardia ([place])   . Chronic renal disease, stage 4, severely decreased glomerular filtration rate between 15-29 mL/min/1.73 square meter (HCC)   . Anemia  in chronic kidney disease 12/09/2014  . CKD (chronic kidney disease) 12/09/2014  . Hypertensive heart/renal disease with failure ([place]) 12/09/2014  . Bipolar affective disorder ([place]) 12/09/2014  . Acute encephalopathy   . Acute delirium 11/18/2014  . Type 2 diabetes mellitus with hyperglycemia ([place]) 11/18/2014  . Essential hypertension 11/18/2014  . Chronic combined systolic and diastolic CHF (congestive heart failure) ([place]) 11/18/2014  . Fracture dislocation of ankle 11/17/2014  . DM type 2, uncontrolled, with renal complications ([place]) 32/44/0102  . Hypokalemia 11/17/2014  . Essential hypertension, benign 11/17/2014  . Obesity 11/17/2014  . CAD (coronary artery disease), native coronary artery with 2 stents  11/17/2014    Past Surgical History:  Procedure Laterality Date  . ABDOMINAL HYSTERECTOMY    . ANKLE CLOSED REDUCTION N/A 11/17/2014   Procedure: CLOSED REDUCTION ANKLE;  Surgeon: Earlie Server, MD;  Location: [place];  Service: Orthopedics;  Laterality: N/A;  . ANKLE FUSION Left 03/16/2015   Procedure: Left Tibiocalcaneal Fusion;  Surgeon: Newt Minion, MD;  Location: [place];  Service: Orthopedics;  Laterality: Left;  . AV FISTULA PLACEMENT Left [id number]   done at [place]     2 stent   . CHOLECYSTECTOMY    . CORONARY STENT PLACEMENT    . HARDWARE REMOVAL Left 03/16/2015  Procedure: Removal Hardware Left Ankle;  Surgeon: Newt Minion, MD;  Location: [place];  Service: Orthopedics;  Laterality: Left;  . ORIF ANKLE FRACTURE Left 11/20/2014   Procedure: OPEN REDUCTION INTERNAL FIXATION (ORIF) ANKLE FRACTURE;  Surgeon: Renette Butters, MD;  Location: [place];  Service: Orthopedics;  Laterality: Left;  . TONSILLECTOMY      OB History    No data available       Home Medications    Prior to Admission medications   Medication Sig Start Date End Date Taking? Authorizing Provider  bisacodyl (BISACODYL) 5 MG EC tablet Take 5 mg by mouth daily as needed  for moderate constipation.   Yes Historical Provider, MD  bumetanide (BUMEX) 2 MG tablet Take 4 mg by mouth 2 (two) times daily.    Yes Historical Provider, MD  insulin aspart (NOVOLOG) 100 UNIT/ML injection Inject 5-15 Units into the skin 3 (three) times daily before meals. Sliding scale  <150 = 0 units >151-250 = 5-8 units  >251-350 = 8-10 units  >350 = 15 units   Yes Historical Provider, MD  insulin detemir (LEVEMIR) 100 UNIT/ML injection Inject 0.12 mLs (12 Units total) into the skin at bedtime. Patient taking differently: Inject 20-40 Units into the skin 2 (two) times daily. Inject 40 units every morning and 20 units every night. 11/22/14  Yes Bonnielee Haff, MD  albuterol (PROVENTIL HFA;VENTOLIN HFA) 108 (90 BASE) MCG/ACT inhaler Inhale 1-2 puffs into the lungs every 6 (six) hours as needed for wheezing or shortness of breath.    Historical Provider, MD  amLODipine (NORVASC) 10 MG tablet Take 10 mg by mouth daily.    Historical Provider, MD  aspirin EC 81 MG tablet Take 81 mg by mouth daily.    Historical Provider, MD  atorvastatin (LIPITOR) 80 MG tablet Take 40 mg by mouth every evening.    Historical Provider, MD  benztropine (COGENTIN) 0.5 MG tablet Take 0.5 mg by mouth 2 (two) times daily.    Historical Provider, MD  carvedilol (COREG) 25 MG tablet Take 25 mg by mouth 2 (two) times daily with a meal.    Historical Provider, MD  cephALEXin (KEFLEX) 500 MG capsule Take 1 capsule (500 mg total) by mouth 3 (three) times daily. Patient not taking: Reported on 03/13/2016 01/09/16   Barry Dienes, NP  darbepoetin (ARANESP) 40 MCG/0.4ML SOLN injection Inject 40 mcg into the skin every 14 (fourteen) days. 1st and the 15th of each month.    Historical Provider, MD  fluPHENAZine (PROLIXIN) 1 MG tablet Take 1 mg by mouth 2 (two) times daily.     Historical Provider, MD  gabapentin (NEURONTIN) 300 MG capsule Take 300 mg by mouth daily.     Historical Provider, MD  hydrALAZINE (APRESOLINE) 100 MG tablet  Take 100 mg by mouth 2 (two) times daily.    Historical Provider, MD  isosorbide mononitrate (IMDUR) 60 MG 24 hr tablet Take 60 mg by mouth daily.    Historical Provider, MD  pantoprazole (PROTONIX) 20 MG tablet Take 20 mg by mouth daily.    Historical Provider, MD  QUEtiapine (SEROQUEL) 100 MG tablet Take 2 tablets (200 mg total) by mouth at bedtime. Patient taking differently: Take 400 mg by mouth at bedtime.  11/22/14   Bonnielee Haff, MD  traMADol (ULTRAM) 50 MG tablet Take 1 tablet (50 mg total) by mouth every 6 (six) hours as needed. Maximum dose= 8 tablets per day 03/19/15   Newt Minion, MD  warfarin (  COUMADIN) 2 MG tablet Take 2 tablets (4 mg total) by mouth daily. 12/05/15   Debbe Odea, MD    Family History No family history on file.  Social History Social History  Substance Use Topics  . Smoking status: Current Every Day Smoker    Packs/day: 0.50    Types: Cigarettes  . Smokeless tobacco: Never Used  . Alcohol use No     Allergies   Oxycodone   Review of Systems Review of Systems  Unable to perform ROS: Mental status change  Constitutional: Positive for fever.  Respiratory: Positive for cough.   Hematological: Bruises/bleeds easily.  Psychiatric/Behavioral: Positive for confusion.     Physical Exam Updated Vital Signs BP 153/71   Pulse 79   Temp 100.5 F (38.1 C) (Oral)   Resp 20   Ht 5\' 4"  (1.626 m)   Wt 77.1 kg   SpO2 95%   BMI 29.18 kg/m   Physical Exam  Constitutional: She appears well-developed and well-nourished. No distress.  HENT:  Head: Normocephalic and atraumatic.  Mouth/Throat: Oropharynx is clear and moist.  Eyes: EOM are normal. Pupils are equal, round, and reactive to light.  Neck: Normal range of motion. Neck supple.  Cardiovascular: Normal rate, regular rhythm and normal heart sounds.   No murmur heard. Pulmonary/Chest: Effort normal and breath sounds normal. No respiratory distress.  Abdominal: Soft. Bowel sounds are normal.  There is no tenderness.  Musculoskeletal: Normal range of motion. She exhibits no edema.  AV fistula functional left upper arm  Neurological: She is alert. She displays normal reflexes. No cranial nerve deficit. She exhibits normal muscle tone.  Skin: Skin is warm. No rash noted.  Nursing note and vitals reviewed.    ED Treatments / Results  Labs (all labs ordered are listed, but only abnormal results are displayed) Labs Reviewed  COMPREHENSIVE METABOLIC PANEL - Abnormal; Notable for the following:       Result Value   Potassium 3.3 (*)    Glucose, Bld 224 (*)    BUN 55 (*)    Creatinine, Ser 4.32 (*)    Albumin 3.4 (*)    GFR calc non Af Amer 10 (*)    GFR calc Af Amer 12 (*)    All other components within normal limits  CBC WITH DIFFERENTIAL/PLATELET - Abnormal; Notable for the following:    RBC 3.15 (*)    Hemoglobin 9.4 (*)    HCT 29.0 (*)    All other components within normal limits  PROTIME-INR - Abnormal; Notable for the following:    Prothrombin Time 33.3 (*)    All other components within normal limits  BRAIN NATRIURETIC PEPTIDE - Abnormal; Notable for the following:    B Natriuretic Peptide 626.8 (*)    All other components within normal limits  CBG MONITORING, ED - Abnormal; Notable for the following:    Glucose-Capillary 229 (*)    All other components within normal limits  CULTURE, BLOOD (ROUTINE X 2)  CULTURE, BLOOD (ROUTINE X 2)  URINE CULTURE  URINALYSIS, ROUTINE W REFLEX MICROSCOPIC (NOT AT [facility])  I-STAT BETA HCG BLOOD, ED ([facility], [facility], [facility] ONLY)  I-STAT CG4 LACTIC ACID, ED  I-STAT CG4 LACTIC ACID, ED    EKG  EKG Interpretation  Date/Time:  Thursday March 13 2016 14:25:13 EDT Ventricular Rate:  79 PR Interval:    QRS Duration: 85 QT Interval:  433 QTC Calculation: 497 R Axis:   -34 Text Interpretation:  Sinus rhythm Probable LVH with secondary  repol abnrm Borderline prolonged QT interval No significant change since last tracing Confirmed by  Jenniefer Salak  MD, [place] [phone]) on 03/13/2016 2:30:20 PM       Radiology Dg Chest 2 View  Result Date: 03/13/2016 CLINICAL DATA:  Cough for 1 week, fever, smoking history EXAM: CHEST  2 VIEW COMPARISON:  Chest x-ray of 11/29/2015 FINDINGS: There is parenchymal opacity within the right upper lobe most consistent with pneumonia. Slight blunting of the left costophrenic angle may be chronic and could represent pleural thickening. Also there is moderate cardiomegaly present with possible mild pulmonary vascular congestion. Some fluid is noted in the fissures on the lateral view. No bony abnormality is seen. IMPRESSION: 1. Parenchymal opacity in the right upper lobe suspicious for pneumonia. 2. Moderate cardiomegaly with evidence of pulmonary vascular congestion. Question mild CHF. Electronically Signed   By: Ivar Drape M.D.   On: 03/13/2016 15:56    Procedures Procedures (including critical care time)  Medications Ordered in ED Medications  vancomycin (VANCOCIN) IVPB 1000 mg/200 mL premix (1,000 mg Intravenous New Bag/Given 03/13/16 1527)  0.9 %  sodium chloride infusion (1,000 mLs Intravenous New Bag/Given 03/13/16 1501)  furosemide (LASIX) injection 80 mg (not administered)  piperacillin-tazobactam (ZOSYN) IVPB 3.375 g (3.375 g Intravenous New Bag/Given 03/13/16 1501)     Initial Impression / Assessment and Plan / ED Course  I have reviewed the triage vital signs and the nursing notes.  Pertinent labs & imaging results that were available during my care of the patient were reviewed by me and considered in my medical decision making (see chart for details).  Clinical Course    Patient presented with vital signs concerning for sepsis. Fever of 100.5 elevated respiratory rate. Although not documented. Blood pressure was normal. No tachycardia. Oxygen requirement. Sats below 90% on room air patient normally on oxygen. On 2 L sats around 95%.  Sepsis orders were initiated without any of  fluids being ordered. Patient's initial lactic acid came back as normal. Patient did not have an elevated white blood cell count.  Patient was treated with Zosyn and bank. Cultures were sent prior. Mackey [place] raises concern for pneumonia. Technically this would be a community acquired pneumonia. Patient's last hospitalization was early July. So therefore greater than 90 days.  Daughter noted 3 day history of a change in her mental status. Patient will follow commands.  Patient has a history of CHF. Daughter Station taking her medications today. So patient given some Lasix. Chest x-ray does show some concerns for mild CHF.  Patient will require admission. Hospital is called.     Final Clinical Impressions(s) / ED Diagnoses   Final diagnoses:  Community acquired pneumonia of right upper lobe of lung ([place])  Hypoxia  Altered mental status, unspecified altered mental status type    New Prescriptions New Prescriptions   No medications on file     Fredia Sorrow, MD 03/13/16 1621

## 2016-03-13 NOTE — H&P (Signed)
[facility] History and Physical  Robin [name]:[id number] DOB: April 21, 1951 DOA: 03/13/2016   PCP: [facility]  Specialists: Nephrologist at the [place]  Chief Complaint: Shortness of breath and cough for the last 5 days  HPI: Robin [name] is a 65 y.o. female with a past medical history of coronary artery disease, recent history of DVT on warfarin, chronic kidney disease stage IV, presence of an AV fistula in the left arm, though not currently on dialysis, diabetes on insulin, bipolar disorder who presents to the hospital with complaints of shortness of breath and cough. These symptoms have been ongoing for about 5 days. She tells me that she is coughing up greenish sputum. Denies any blood in the sputum. She's had some difficulty breathing along with some chest pain, especially when she coughs and takes a deep breath. She denies any nausea or vomiting. Patient is somewhat distracted. History is not entirely reliable. She denies any dizziness, lightheadedness. No diarrhea. No joint pains. States that she always has leg swelling and is no worse than usual. Denies any sick contacts. She has not received a flu shot this season yet. Her symptoms got worse and so her daughter decided to bring her to the emergency department.  In the emergency department She had a temperature 99.62F. She was saturating 87% on room air. Saturations improved with the oxygen by nasal cannula. Evaluation revealed pneumonia in the right upper lung. Last hospitalization was in July. She will be admitted for further management of community-acquired pneumonia.  Home Medications: Home medication list has not been reconciled yet. Prior to Admission medications   Medication Sig Start Date End Date Taking? Authorizing Provider  albuterol (PROVENTIL HFA;VENTOLIN HFA) 108 (90 BASE) MCG/ACT inhaler Inhale 1-2 puffs into the lungs every 6 (six) hours as needed for wheezing or shortness of breath.   Yes Historical  Provider, MD  amLODipine (NORVASC) 10 MG tablet Take 10 mg by mouth daily.   Yes Historical Provider, MD  aspirin EC 81 MG tablet Take 81 mg by mouth daily.   Yes Historical Provider, MD  atorvastatin (LIPITOR) 80 MG tablet Take 40 mg by mouth every evening.   Yes Historical Provider, MD  benztropine (COGENTIN) 0.5 MG tablet Take 0.5 mg by mouth 2 (two) times daily.   Yes Historical Provider, MD  bisacodyl (BISACODYL) 5 MG EC tablet Take 5 mg by mouth daily.    Yes Historical Provider, MD  bumetanide (BUMEX) 2 MG tablet Take 4 mg by mouth 2 (two) times daily.    Yes Historical Provider, MD  carvedilol (COREG) 25 MG tablet Take 25 mg by mouth 2 (two) times daily with a meal.   Yes Historical Provider, MD  darbepoetin (ARANESP) 40 MCG/0.4ML SOLN injection Inject 40 mcg into the skin every 7 (seven) days.    Yes Historical Provider, MD  ferrous sulfate 325 (65 FE) MG tablet Take 325 mg by mouth 2 (two) times daily with a meal.   Yes Historical Provider, MD  fluPHENAZine (PROLIXIN) 1 MG tablet Take 1 mg by mouth at bedtime.    Yes Historical Provider, MD  gabapentin (NEURONTIN) 300 MG capsule Take 300 mg by mouth daily.    Yes Historical Provider, MD  glucagon (GLUCAGON EMERGENCY) 1 MG injection Inject 1 mg into the vein once as needed (for low blood sugar).   Yes Historical Provider, MD  hydrALAZINE (APRESOLINE) 100 MG tablet Take 100 mg by mouth 2 (two) times daily.   Yes Historical Provider,  MD  insulin aspart (NOVOLOG) 100 UNIT/ML injection Inject 5-15 Units into the skin 3 (three) times daily before meals. Sliding scale  <150 = 0 units >151-250 = 5-8 units  >251-350 = 8-10 units  >350 = 15 units   Yes Historical Provider, MD  insulin detemir (LEVEMIR) 100 UNIT/ML injection Inject 0.12 mLs (12 Units total) into the skin at bedtime. Patient taking differently: Inject 20-40 Units into the skin 2 (two) times daily. Inject 40 units every morning and 20 units every night. 11/22/14  Yes Bonnielee Haff,  MD  isosorbide mononitrate (IMDUR) 60 MG 24 hr tablet Take 60 mg by mouth daily with breakfast.    Yes Historical Provider, MD  pantoprazole (PROTONIX) 20 MG tablet Take 20 mg by mouth 2 (two) times daily.    Yes Historical Provider, MD  cephALEXin (KEFLEX) 500 MG capsule Take 1 capsule (500 mg total) by mouth 3 (three) times daily. Patient not taking: Reported on 03/13/2016 01/09/16   Barry Dienes, NP  QUEtiapine (SEROQUEL) 100 MG tablet Take 2 tablets (200 mg total) by mouth at bedtime. Patient taking differently: Take 400 mg by mouth at bedtime.  11/22/14   Bonnielee Haff, MD  traMADol (ULTRAM) 50 MG tablet Take 1 tablet (50 mg total) by mouth every 6 (six) hours as needed. Maximum dose= 8 tablets per day 03/19/15   Newt Minion, MD  warfarin (COUMADIN) 2 MG tablet Take 2 tablets (4 mg total) by mouth daily. 12/05/15   Debbe Odea, MD    Allergies:  Allergies  Allergen Reactions  . Oxycodone Other (See Comments)    Makes patient lethargic, sleeping for few days, altered mental status    Past Medical History: Past Medical History:  Diagnosis Date  . Anemia   . Anxiety   . Arthritis   . Benign hypertension   . Bipolar disorder ([place])   . CHF (congestive heart failure) ([place])   . Chronic kidney disease, stage V [facility])    Nephrologist is with [facility] (Dr. Tammi Klippel)  . Coronary artery disease   . Depression   . Diabetes mellitus without complication ([place])   . GERD (gastroesophageal reflux disease)   . Heart murmur   . History of blood transfusion   . History of bronchitis   . History of pneumonia   . Shortness of breath dyspnea   . Sleep apnea   . Type 2 diabetes mellitus ([place])     Past Surgical History:  Procedure Laterality Date  . ABDOMINAL HYSTERECTOMY    . ANKLE CLOSED REDUCTION N/A 11/17/2014   Procedure: CLOSED REDUCTION ANKLE;  Surgeon: Earlie Server, MD;  Location: [place];  Service: Orthopedics;  Laterality: N/A;  . ANKLE FUSION Left 03/16/2015   Procedure: Left  Tibiocalcaneal Fusion;  Surgeon: Newt Minion, MD;  Location: [place];  Service: Orthopedics;  Laterality: Left;  . AV FISTULA PLACEMENT Left [id number]   done at [place]     2 stent   . CHOLECYSTECTOMY    . CORONARY STENT PLACEMENT    . HARDWARE REMOVAL Left 03/16/2015   Procedure: Removal Hardware Left Ankle;  Surgeon: Newt Minion, MD;  Location: [place];  Service: Orthopedics;  Laterality: Left;  . ORIF ANKLE FRACTURE Left 11/20/2014   Procedure: OPEN REDUCTION INTERNAL FIXATION (ORIF) ANKLE FRACTURE;  Surgeon: Renette Butters, MD;  Location: [place];  Service: Orthopedics;  Laterality: Left;  . TONSILLECTOMY      Social History: She lives by herself.  Uses a cane to ambulate. Continues to smoke 8 cigarettes on a daily basis. Denies any illicit drug use. Denies any alcohol use.  Family History: She mentions history of diabetes in the family. Wasn't very specific.  Review of Systems - History obtained from the patient General ROS: positive for  - fatigue Psychological ROS: negative Ophthalmic ROS: negative ENT ROS: negative Allergy and Immunology ROS: negative Hematological and Lymphatic ROS: negative Endocrine ROS: negative Respiratory ROS: as in hpi Cardiovascular ROS: as in hpi Gastrointestinal ROS: no abdominal pain, change in bowel habits, or black or bloody stools Genito-Urinary ROS: no dysuria, trouble voiding, or hematuria Musculoskeletal ROS: negative Neurological ROS: no TIA or stroke symptoms Dermatological ROS: negative  Physical Examination  Vitals:   03/13/16 1410 03/13/16 1421 03/13/16 1422  BP: 153/71    Pulse: 80 79   Resp: 16  20  Temp: 99.6 F (37.6 C) 100.5 F (38.1 C)   TempSrc: Oral Oral   SpO2: (!) 87% 95%   Weight: 77.6 kg (171 lb)  77.1 kg (170 lb)  Height: 5\' 4"  (1.626 m)      BP 153/71   Pulse 79   Temp 100.5 F (38.1 C) (Oral)   Resp 20   Ht 5\' 4"  (1.626 m)   Wt 77.1 kg (170 lb)   SpO2 95%   BMI 29.18 kg/m    General appearance: alert, cooperative, appears stated age, no distress and moderately obese Head: Normocephalic, without obvious abnormality, atraumatic Eyes: conjunctivae/corneas clear. PERRL, EOM's intact.  Throat: Dry mucous membranes Neck: no adenopathy, no carotid bruit, no JVD, supple, symmetrical, trachea midline and thyroid not enlarged, symmetric, no tenderness/mass/nodules Back: symmetric, no curvature. ROM normal. No CVA tenderness. Resp: End expiratory wheezing bilaterally. Crackles appreciated, right more than left lung. No rhonchi. Cardio: regular rate and rhythm, S1, S2 normal, no murmur, click, rub or gallop GI: soft, non-tender; bowel sounds normal; no masses,  no organomegaly Extremities: Minimal pedal edema bilateral lower extremities Pulses: Poorly palpable in the lower extremities. There is AV fistula in the left arm with good bruit. Skin: Skin color, texture, turgor normal. No rashes or lesions Lymph nodes: Cervical, supraclavicular, and axillary nodes normal. Neurologic: Awake and alert. Oriented 3. No focal neurological deficits.   Labs on Admission: I have personally reviewed following labs and imaging studies  CBC:  Recent Labs Lab 03/13/16 1426  WBC 8.8  NEUTROABS 6.7  HGB 9.4*  HCT 29.0*  MCV 92.1  PLT 782   Basic Metabolic Panel:  Recent Labs Lab 03/13/16 1426  NA 135  K 3.3*  CL 101  CO2 23  GLUCOSE 224*  BUN 55*  CREATININE 4.32*  CALCIUM 9.0   GFR: Estimated Creatinine Clearance: 13.4 mL/min (by C-G formula based on SCr of 4.32 mg/dL (H)). Liver Function Tests:  Recent Labs Lab 03/13/16 1426  AST 17  ALT 14  ALKPHOS 89  BILITOT 0.8  PROT 7.6  ALBUMIN 3.4*   Coagulation Profile:  Recent Labs Lab 03/13/16 1500  INR 3.18   CBG:  Recent Labs Lab 03/13/16 1426  GLUCAP 229*    Radiological Exams on Admission: Dg Chest 2 View  Result Date: 03/13/2016 CLINICAL DATA:  Cough for 1 week, fever, smoking history  EXAM: CHEST  2 VIEW COMPARISON:  Chest x-ray of 11/29/2015 FINDINGS: There is parenchymal opacity within the right upper lobe most consistent with pneumonia. Slight blunting of the left costophrenic angle may be chronic and could represent pleural thickening. Also there is  moderate cardiomegaly present with possible mild pulmonary vascular congestion. Some fluid is noted in the fissures on the lateral view. No bony abnormality is seen. IMPRESSION: 1. Parenchymal opacity in the right upper lobe suspicious for pneumonia. 2. Moderate cardiomegaly with evidence of pulmonary vascular congestion. Question mild CHF. Electronically Signed   By: Ivar Drape M.D.   On: 03/13/2016 15:56    My interpretation of Electrocardiogram: Sinus rhythm in the 70s. Left axis deviation. Nonspecific T wave changes in the form of conversion noted diffusely. Changes are similar to previous EKG. No concerning ST changes.   Problem List  Principal Problem:   CAP (community acquired pneumonia) Active Problems:   DM type 2, uncontrolled, with renal complications ([place])   CAD (coronary artery disease), native coronary artery with 2 stents    Chronic combined systolic and diastolic CHF (congestive heart failure) (HCC)   Anemia in chronic kidney disease   Bipolar affective disorder ([place])   Chronic renal disease, stage 4, severely decreased glomerular filtration rate between 15-29 mL/min/1.73 square meter (HCC)   Acute respiratory failure with hypoxemia (HCC)   Right leg DVT ([place])   Assessment: This is a 65 year old African-American female with a past medical history as stated earlier, presents with a five-day history of cough, fever, shortness of breath. Chest x-ray is concerning for pneumonia in the right upper lobe. WBC, however, is normal. She does have low-grade fever in the emergency department. Symptoms are suggestive of an infectious etiology. She was discharged from this hospital on July 12. She is outside the window for  healthcare associated. This is most likely community-acquired pneumonia.  Plan: #1 acute respiratory failure with hypoxia: Most likely secondary to pneumonia. Oxygenation has improved with oxygen by nasal cannula. As her pneumonia is treated her oxygen requirement should improve. She is also wheezing which is likely due to the fact that she smokes cigarette, and could have an element of bronchitis. She'll be given nebulizer treatments.  #2 Community-acquired pneumonia: She'll be treated with ceftriaxone and azithromycin. She was given vancomycin and Zosyn in the emergency department here as there was some concern for sepsis. However, lactic acid level is normal. Her vital signs are all normal. No clear evidence for sepsis at this time.  #3 chronic kidney disease stage IV: Creatinine is slightly higher than her baseline. She states that she is making normal amount of urine, although there is some confusion regarding this. Her daughter thinks that she is making less urine than before. We will monitor her urine output. She was given a dose of intravenous Lasix by the emergency department. Continue her Bumex from tomorrow. She does have AV fistula in the left arm, although she has not started dialysis yet.  #4 history of coronary artery disease: Continue aspirin. Continue beta blocker. EKG does not show any ischemic changes. Chest pain is secondary to her pneumonia.  #5 history of recent right leg DVT: She is on warfarin. This was diagnosed in July. She's completed about 3 months of treatment. Circumstances of this DVT are not entirely clear based on previous records. We'll continue warfarin for now. Defer discontinuation to her outpatient providers.  #6 history of bipolar disorder: Continue with home medications.  #7 history of anemia of chronic disease: Hemoglobin is set baseline. Monitor closely.  #8 history of diabetes mellitus type 2 with renal complications: Continue with her home medication  regimen including her insulin. Initiate sliding scale coverage. Last HbA1c in our system is from August 2016 when it was 6.4.  DVT Prophylaxis: She is already on warfarin Code Status: Full code Family Communication: Discussed with the patient. Daughter was not at the bedside  Disposition Plan: MedSurg  Consults called: None  Admission status: Inpatient status due to hypoxic respiratory failure, pneumonia.   Further management decisions will depend on results of further testing and patient's response to treatment.   [facility]  [facility] Pager [phone]  If 7PM-7AM, please contact night-coverage [email or web address] Password [email or web address]  03/13/2016, 4:53 PM

## 2016-03-13 NOTE — ED Triage Notes (Signed)
Pt states that she hasn't been feeling herself.  States that she has been having productive cough, vomiting, and SOB.  Pt sats were 81% on RA.  Placed on 2L [place].  Now 94%.  Denies any diarrhea/constipation.  Denies pain.  Pt is alert to self, location, DOB, month after a few moments of thinking but states that it is Tuesday.  Pt feels warm to touch.

## 2016-03-14 LAB — CBC
HCT: 26.7 % — ABNORMAL LOW (ref 36.0–46.0)
Hemoglobin: 8.5 g/dL — ABNORMAL LOW (ref 12.0–15.0)
MCH: 29.5 pg (ref 26.0–34.0)
MCHC: 31.8 g/dL (ref 30.0–36.0)
MCV: 92.7 fL (ref 78.0–100.0)
PLATELETS: 223 10*3/uL (ref 150–400)
RBC: 2.88 MIL/uL — AB (ref 3.87–5.11)
RDW: 15.3 % (ref 11.5–15.5)
WBC: 10 10*3/uL (ref 4.0–10.5)

## 2016-03-14 LAB — EXPECTORATED SPUTUM ASSESSMENT W REFEX TO RESP CULTURE

## 2016-03-14 LAB — BASIC METABOLIC PANEL
Anion gap: 10 (ref 5–15)
BUN: 54 mg/dL — AB (ref 6–20)
CHLORIDE: 104 mmol/L (ref 101–111)
CO2: 24 mmol/L (ref 22–32)
Calcium: 8.8 mg/dL — ABNORMAL LOW (ref 8.9–10.3)
Creatinine, Ser: 4.28 mg/dL — ABNORMAL HIGH (ref 0.44–1.00)
GFR calc Af Amer: 12 mL/min — ABNORMAL LOW (ref >60)
GFR calc non Af Amer: 10 mL/min — ABNORMAL LOW (ref >60)
GLUCOSE: 255 mg/dL — AB (ref 65–99)
POTASSIUM: 2.9 mmol/L — AB (ref 3.5–5.1)
Sodium: 138 mmol/L (ref 135–145)

## 2016-03-14 LAB — HIV ANTIBODY (ROUTINE TESTING W REFLEX): HIV Screen 4th Generation wRfx: NONREACTIVE

## 2016-03-14 LAB — GLUCOSE, CAPILLARY
GLUCOSE-CAPILLARY: 179 mg/dL — AB (ref 65–99)
Glucose-Capillary: 100 mg/dL — ABNORMAL HIGH (ref 65–99)
Glucose-Capillary: 72 mg/dL (ref 65–99)
Glucose-Capillary: 111 mg/dL — ABNORMAL HIGH (ref 65–99)

## 2016-03-14 LAB — INFLUENZA PANEL BY PCR (TYPE A & B)
H1N1 flu by pcr: NOT DETECTED
INFLAPCR: NEGATIVE
INFLBPCR: NEGATIVE

## 2016-03-14 LAB — PROTIME-INR
INR: 2.88
PROTHROMBIN TIME: 30.7 s — AB (ref 11.4–15.2)

## 2016-03-14 LAB — STREP PNEUMONIAE URINARY ANTIGEN: Strep Pneumo Urinary Antigen: NEGATIVE

## 2016-03-14 MED ORDER — WARFARIN SODIUM 5 MG PO TABS: 5.0000 mg | ORAL_TABLET | Freq: Once | ORAL | Status: DC

## 2016-03-14 MED ORDER — GUAIFENESIN ER 600 MG PO TB12
600.0000 mg | ORAL_TABLET | Freq: Two times a day (BID) | ORAL | Status: DC
  Administered 2016-03-14 – 2016-03-17 (×6): 600 mg via ORAL
  Filled 2016-03-14 (×7): qty 1

## 2016-03-14 MED ORDER — WARFARIN - PHARMACIST DOSING INPATIENT: Freq: Every day | Status: DC

## 2016-03-14 MED ORDER — IPRATROPIUM-ALBUTEROL 0.5-2.5 (3) MG/3ML IN SOLN
3.0000 mL | Freq: Two times a day (BID) | RESPIRATORY_TRACT | Status: DC
Start: 2016-03-14 — End: 2016-03-17
  Administered 2016-03-14 – 2016-03-16 (×5): 3 mL via RESPIRATORY_TRACT
  Filled 2016-03-14 (×6): qty 3

## 2016-03-14 MED ORDER — POTASSIUM CHLORIDE CRYS ER 20 MEQ PO TBCR
20.0000 meq | EXTENDED_RELEASE_TABLET | Freq: Once | ORAL | Status: AC
  Administered 2016-03-14: 20 meq via ORAL
  Filled 2016-03-14: qty 1

## 2016-03-14 NOTE — Progress Notes (Signed)
[facility] PROGRESS NOTE  JUNO ALERS ALP:[id number] DOB: Feb 05, 1951 DOA: 03/13/2016  PCP: [facility]  Brief History/Interval Summary:  Robin [name] is a 65 y.o. female with a past medical history of coronary artery disease, recent history of DVT on warfarin, chronic kidney disease stage IV, presence of an AV fistula in the left arm, though not currently on dialysis, diabetes on insulin, bipolar disorder who presents to the hospital with complaints of shortness of breath and cough. These symptoms have been ongoing for about 5 days. Patient was diagnosed as having pneumonia. She was hospitalized for further management.  Reason for Visit: Community-acquired pneumonia  Consultants: None  Procedures: None  Antibiotics: Ceftriaxone and azithromycin  Subjective/Interval History: Patient feels fatigued. Continues to have a cough. Breathing is slightly better. Denies any nausea or vomiting. Not a very good historian.  ROS: Denies chest pain  Objective:  Vital Signs  Vitals:   03/13/16 2049 03/13/16 2113 03/14/16 0533 03/14/16 0932  BP:  (!) 140/56 (!) 146/68   Pulse:  76 75   Resp:  20 16   Temp:  99.6 F (37.6 C) 99.5 F (37.5 C)   TempSrc:  Oral Oral   SpO2: 91% 93% 98% 98%  Weight:   74.4 kg (164 lb)   Height:        Intake/Output Summary (Last 24 hours) at 03/14/16 1241 Last data filed at 03/14/16 0810  Gross per 24 hour  Intake              910 ml  Output              550 ml  Net              360 ml   Filed Weights   03/13/16 1410 03/13/16 1422 03/14/16 0533  Weight: 77.6 kg (171 lb) 77.1 kg (170 lb) 74.4 kg (164 lb)    General appearance: alert, cooperative, appears stated age and no distress Resp: Improved air entry bilaterally. Less wheezing compared to yesterday. Crackles continued to be present, right more than left. Cardio: regular rate and rhythm, S1, S2 normal, no murmur, click, rub or gallop GI: soft, non-tender; bowel sounds  normal; no masses,  no organomegaly Extremities: extremities normal, atraumatic, no cyanosis or edema Neurologic: Awake and alert. Oriented to place. Somewhat distracted. No cranial nerve deficits. Able to move all her extremities.  Lab Results:  Data Reviewed: I have personally reviewed following labs and imaging studies  CBC:  Recent Labs Lab 03/13/16 1426 03/14/16 0501  WBC 8.8 10.0  NEUTROABS 6.7  --   HGB 9.4* 8.5*  HCT 29.0* 26.7*  MCV 92.1 92.7  PLT 236 353    Basic Metabolic Panel:  Recent Labs Lab 03/13/16 1426 03/14/16 0501  NA 135 138  K 3.3* 2.9*  CL 101 104  CO2 23 24  GLUCOSE 224* 255*  BUN 55* 54*  CREATININE 4.32* 4.28*  CALCIUM 9.0 8.8*    GFR: Estimated Creatinine Clearance: 13.3 mL/min (by C-G formula based on SCr of 4.28 mg/dL (H)).  Liver Function Tests:  Recent Labs Lab 03/13/16 1426  AST 17  ALT 14  ALKPHOS 89  BILITOT 0.8  PROT 7.6  ALBUMIN 3.4*    Coagulation Profile:  Recent Labs Lab 03/13/16 1500 03/14/16 0501  INR 3.18 2.88    CBG:  Recent Labs Lab 03/13/16 1426 03/13/16 1821 03/13/16 2108 03/14/16 0735 03/14/16 1209  GLUCAP 229* 242* 272* 179* 100*  Recent Results (from the past 240 hour(s))  Culture, blood (Routine x 2)     Status: None (Preliminary result)   Collection Time: 03/13/16  2:29 PM  Result Value Ref Range Status   Specimen Description BLOOD RIGHT ARM  Final   Special Requests BOTTLES DRAWN AEROBIC AND ANAEROBIC 5 CC EA  Final   Culture   Final    NO GROWTH < 24 HOURS Performed at [facility]    Report Status PENDING  Incomplete  Culture, blood (Routine x 2)     Status: None (Preliminary result)   Collection Time: 03/13/16  3:25 PM  Result Value Ref Range Status   Specimen Description BLOOD RIGHT HAND  Final   Special Requests BOTTLES DRAWN AEROBIC ONLY 5CC  Final   Culture   Final    NO GROWTH < 24 HOURS Performed at [facility]    Report Status PENDING   Incomplete  Culture, sputum-assessment     Status: None   Collection Time: 03/14/16  8:27 AM  Result Value Ref Range Status   Specimen Description SPUTUM  Final   Special Requests NONE  Final   Sputum evaluation   Final    THIS SPECIMEN IS ACCEPTABLE. RESPIRATORY CULTURE REPORT TO FOLLOW.   Report Status 03/14/2016 FINAL  Final  Culture, respiratory (NON-Expectorated)     Status: None (Preliminary result)   Collection Time: 03/14/16  8:27 AM  Result Value Ref Range Status   Specimen Description SPUTUM  Final   Special Requests NONE  Final   Gram Stain   Final    MODERATE WBC PRESENT,BOTH PMN AND MONONUCLEAR NO ORGANISMS SEEN    Culture PENDING  Incomplete   Report Status PENDING  Incomplete      Radiology Studies: Dg Chest 2 View  Result Date: 03/13/2016 CLINICAL DATA:  Cough for 1 week, fever, smoking history EXAM: CHEST  2 VIEW COMPARISON:  Chest x-ray of 11/29/2015 FINDINGS: There is parenchymal opacity within the right upper lobe most consistent with pneumonia. Slight blunting of the left costophrenic angle may be chronic and could represent pleural thickening. Also there is moderate cardiomegaly present with possible mild pulmonary vascular congestion. Some fluid is noted in the fissures on the lateral view. No bony abnormality is seen. IMPRESSION: 1. Parenchymal opacity in the right upper lobe suspicious for pneumonia. 2. Moderate cardiomegaly with evidence of pulmonary vascular congestion. Question mild CHF. Electronically Signed   By: Ivar Drape M.D.   On: 03/13/2016 15:56     Medications:  Scheduled: . amLODipine  10 mg Oral Daily  . aspirin EC  81 mg Oral Daily  . atorvastatin  40 mg Oral QPM  . azithromycin  500 mg Intravenous Q24H  . benztropine  0.5 mg Oral BID  . bisacodyl  5 mg Oral Daily  . bumetanide  4 mg Oral BID  . carvedilol  25 mg Oral BID WC  . cefTRIAXone (ROCEPHIN)  IV  1 g Intravenous Q24H  . ferrous sulfate  325 mg Oral BID WC  . fluPHENAZine  1  mg Oral QHS  . gabapentin  300 mg Oral Daily  . guaiFENesin  600 mg Oral BID  . hydrALAZINE  100 mg Oral BID  . insulin aspart  0-9 Units Subcutaneous TID WC  . insulin detemir  12 Units Subcutaneous QHS  . ipratropium-albuterol  3 mL Nebulization BID  . isosorbide mononitrate  60 mg Oral Q breakfast  . pantoprazole  20 mg Oral BID  .  QUEtiapine  200 mg Oral QHS  . sodium chloride flush  3 mL Intravenous Q12H   Continuous:  YHC:WCBJSE chloride, acetaminophen **OR** acetaminophen, albuterol, ondansetron **OR** ondansetron (ZOFRAN) IV, sodium chloride flush, traMADol  Assessment/Plan:  Principal Problem:   CAP (community acquired pneumonia) Active Problems:   DM type 2, uncontrolled, with renal complications (HCC)   CAD (coronary artery disease), native coronary artery with 2 stents    Chronic combined systolic and diastolic CHF (congestive heart failure) (HCC)   Anemia in chronic kidney disease   Bipolar affective disorder (HCC)   Chronic renal disease, stage 4, severely decreased glomerular filtration rate between 15-29 mL/min/1.73 square meter (HCC)   Acute respiratory failure with hypoxemia (HCC)   Right leg DVT (H[facility])    Acute respiratory failure with hypoxia Most likely secondary to pneumonia. Oxygenation has improved with oxygen by nasal cannula. As her pneumonia is treated her oxygen requirement should improve. Patient was also noted to be wheezing. That appears to have improved. Continue nebulizer treatments. Influenza PCR was negative.  Community-acquired pneumonia Continue ceftriaxone and azithromycin. Patient had normal lactic acid level. No evidence for sepsis at this time.   Generalized weakness and fatigue Most likely secondary to pneumonia. No obvious focal neurological deficits are noted. Patient is a poor historian also. PT evaluation.  Chronic kidney disease stage IV Creatinine is slightly higher than her baseline, although it would be difficult to call this  acute renal failure. Continue with her Bumex and other medications. She should follow-up with her nephrologist. She does have AV fistula in the left arm, although she has not started dialysis yet.  History of coronary artery disease Stable. Continue aspirin. Continue beta blocker. EKG does not show any ischemic changes. No chest pain this morning.  History of recent right leg DVT She is on warfarin. INR is therapeutic. This was diagnosed in July. She's completed about 3 months of treatment. Circumstances of this DVT are not entirely clear based on previous records. We'll continue warfarin for now. Defer discontinuation to her outpatient providers.  History of bipolar disorder Continue with home medications.  History of anemia of chronic disease Hemoglobin is close to baseline. No evidence for overt bleeding. Continue to monitor.   History of diabetes mellitus type 2 with renal complications Continue with her home medication regimen including her insulin. Sliding scale coverage. Last HbA1c in our system is from August 2016 when it was 6.4.    DVT Prophylaxis: On warfarin    Code Status: Full code  Family Communication: Discussed with the patient. No family at bedside.  Disposition Plan: Await PT evaluation. Management as outlined above. Await improvement.    LOS: 1 day   [facility] Pager [phone] 03/14/2016, 12:41 PM  If 7PM-7AM, please contact night-coverage at [email or web address], password [facility]

## 2016-03-14 NOTE — Evaluation (Signed)
Physical Therapy Evaluation Patient Details Name: Robin [name] MRN: [id number] DOB: 1951/04/11 Today's Date: 03/14/2016   History of Present Illness  Pt admitted through ED with AMS and dx with CAP.  Pt with hx of CKD, bipolar, CAD, DM, and L ankle fusion 10/16.  Clinical Impression  Pt admitted as above and presenting with functional mobility limitations 2* generalized weakness and balance deficits.  Dependent on acute stay progress and level of assist available at home, pt would benefit from follow up rehab at SNF level to maximize IND and safety.    Follow Up Recommendations SNF    Equipment Recommendations  None recommended by PT    Recommendations for Other Services OT consult     Precautions / Restrictions Precautions Precautions: Fall Restrictions Weight Bearing Restrictions: No      Mobility  Bed Mobility Overal bed mobility: Needs Assistance Bed Mobility: Supine to Sit     Supine to sit: Min assist     General bed mobility comments: increased time and cues to focus on task  Transfers Overall transfer level: Needs assistance Equipment used: Rolling walker (2 wheeled) Transfers: Sit to/from Stand Sit to Stand: Min assist;Mod assist         General transfer comment: cues for safe transition position and use of UEs to self assist  Ambulation/Gait Ambulation/Gait assistance: +2 safety/equipment;Min assist;Mod assist Ambulation Distance (Feet): 10 Feet (and additional 7') Assistive device: Rolling walker (2 wheeled) Gait Pattern/deviations: Step-through pattern;Decreased step length - right;Decreased step length - left;Shuffle;Trunk flexed;Wide base of support Gait velocity: decr Gait velocity interpretation: Below normal speed for age/gender General Gait Details: Increased time with cues for posture and position from RW.  Distance ltd by fatigue and buckling at bilt knees  Stairs            Wheelchair Mobility    Modified Rankin (Stroke Patients  Only)       Balance Overall balance assessment: Needs assistance Sitting-balance support: No upper extremity supported;Feet supported Sitting balance-Leahy Scale: Fair     Standing balance support: Bilateral upper extremity supported Standing balance-Leahy Scale: Poor                               Pertinent Vitals/Pain Pain Assessment: No/denies pain    Home Living Family/patient expects to be discharged to:: Private residence Living Arrangements: Alone Available Help at Discharge: Family;Available PRN/intermittently Type of Home: House Home Access: Stairs to enter Entrance Stairs-Rails: Right;Left Entrance Stairs-Number of Steps: 3 Home Layout: Multi-level;Able to live on main level with bedroom/bathroom Home Equipment: Gilford Rile - 2 wheels;Walker - 4 wheels;Wheelchair - [employer];Tub bench      Prior Function Level of Independence: Independent with assistive device(s)         Comments: Pt states she uses cane and lives alone without assist including getting own groceries and housework???????     Hand Dominance        Extremity/Trunk Assessment   Upper Extremity Assessment: Generalized weakness           Lower Extremity Assessment: Generalized weakness         Communication   Communication: No difficulties  Cognition Arousal/Alertness: Awake/alert Behavior During Therapy: Flat affect Overall Cognitive Status: No family/caregiver present to determine baseline cognitive functioning                      General Comments      Exercises  Assessment/Plan    PT Assessment Patient needs continued PT services  PT Problem List Decreased strength;Decreased range of motion;Decreased activity tolerance;Decreased mobility;Decreased balance;Decreased cognition;Decreased knowledge of use of DME;Decreased safety awareness          PT Treatment Interventions DME instruction;Gait training;Stair training;Functional mobility  training;Therapeutic activities;Therapeutic exercise;Balance training;Patient/family education    PT Goals (Current goals can be found in the Care Plan section)  Acute Rehab PT Goals Patient Stated Goal: Home PT Goal Formulation: With patient Time For Goal Achievement: 03/25/16 Potential to Achieve Goals: Fair    Frequency Min 3X/week   Barriers to discharge Decreased caregiver support Pt states lives alone with ltd assist    Co-evaluation               End of Session Equipment Utilized During Treatment: Gait belt Activity Tolerance: Patient limited by fatigue Patient left: in chair;with call bell/phone within reach;with chair alarm set Nurse Communication: Mobility status         Time: 1610-9604 PT Time Calculation (min) (ACUTE ONLY): 24 min   Charges:   PT Evaluation $PT Eval Low Complexity: 1 Procedure PT Treatments $Gait Training: 8-22 mins   PT G Codes:        Brenn Gatton 04-13-2016, 12:29 PM

## 2016-03-14 NOTE — Progress Notes (Signed)
OT Cancellation Note  Patient Details Name: Robin [name] MRN: [id number] DOB: 1951/03/21   Cancelled Treatment:    Reason Eval/Treat Not Completed: Fatigue/lethargy limiting ability to participate  Will check on pt next day  Kari Baars, [place]  Payton Mccallum D 03/14/2016, 11:54 AM

## 2016-03-14 NOTE — Progress Notes (Signed)
[place] for Warfarin Indication: hx  DVT  Allergies  Allergen Reactions  . Oxycodone Other (See Comments)    Makes patient lethargic, sleeping for few days, altered mental status   Patient Measurements: Height: 5\' 4"  (162.6 cm) Weight: 164 lb (74.4 kg) IBW/kg (Calculated) : 54.7  Vital Signs: Temp: 99.5 F (37.5 C) (10/20 0533) Temp Source: Oral (10/20 0533) BP: 146/68 (10/20 0533) Pulse Rate: 75 (10/20 0533)  Labs:  Recent Labs  03/13/16 1426 03/13/16 1500 03/14/16 0501  HGB 9.4*  --  8.5*  HCT 29.0*  --  26.7*  PLT 236  --  223  LABPROT  --  33.3* 30.7*  INR  --  3.18 2.88  CREATININE 4.32*  --  4.28*   Estimated Creatinine Clearance: 13.3 mL/min (by C-G formula based on SCr of 4.28 mg/dL (H)).  Medical History: Past Medical History:  Diagnosis Date  . Anemia   . Anxiety   . Arthritis   . Benign hypertension   . Bipolar disorder ([place])   . CHF (congestive heart failure) ([place])   . Chronic kidney disease, stage V [facility])    Nephrologist is with [facility] (Dr. Tammi Klippel)  . Coronary artery disease   . Depression   . Diabetes mellitus without complication ([place])   . GERD (gastroesophageal reflux disease)   . Heart murmur   . History of blood transfusion   . History of bronchitis   . History of pneumonia   . Shortness of breath dyspnea   . Sleep apnea   . Type 2 diabetes mellitus ([facility])    Medications:  Scheduled:  . amLODipine  10 mg Oral Daily  . aspirin EC  81 mg Oral Daily  . atorvastatin  40 mg Oral QPM  . azithromycin  500 mg Intravenous Q24H  . benztropine  0.5 mg Oral BID  . bisacodyl  5 mg Oral Daily  . bumetanide  4 mg Oral BID  . carvedilol  25 mg Oral BID WC  . cefTRIAXone (ROCEPHIN)  IV  1 g Intravenous Q24H  . ferrous sulfate  325 mg Oral BID WC  . fluPHENAZine  1 mg Oral QHS  . gabapentin  300 mg Oral Daily  . hydrALAZINE  100 mg Oral BID  . insulin aspart  0-9 Units Subcutaneous TID WC  .  insulin detemir  12 Units Subcutaneous QHS  . ipratropium-albuterol  3 mL Nebulization TID  . isosorbide mononitrate  60 mg Oral Q breakfast  . pantoprazole  20 mg Oral BID  . QUEtiapine  200 mg Oral QHS  . sodium chloride flush  3 mL Intravenous Q12H   Assessment: Patient is a 65 y.o with hx DVT on warfarin PTA, presented to the ED on 10/20 with c/o SOB.  Warfarin resumed on admission.  INR was 3.18 on admission.  Patient's daughter stated that she cut back on her mom "greens" consumption a couple of weeks ago because her mom's INR was subtherapeutic at one point.  Reduction in vit K consumption may explain why she is more sensitive to her warfarin (with INR elevated on admit).  Home warfarin regimen: 7.5 mg daily except 5 mg on Tues and Thurs (verified regimen with daughter)  Today, 03/14/2016: - INR now therapeutic at 2.88 - hgb down slightly to 8.5, plt wnl - no bleeding documented - on carb modified diet - drug-drug intxns: on abx  Goal of Therapy:  INR 2-3 Monitor platelets by anticoagulation protocol: Yes  Plan:  - warfarin 5 mg PO x1 today - monitor for s/s bleeding  Dia Sitter, PharmD, BCPS 03/14/2016 8:37 AM

## 2016-03-14 NOTE — Progress Notes (Signed)
CSW spoke with pt / daughter, Aliya Sol [phone], to assist with d/c planning. PT has recommended SNF at d/c. Both pt / daughter have declined SNF placement. Daughter reports that pt's primary insurance is through the [place]. Daughter would appreciate RNCM assistance arranging [facility] services ( PT ) through the [place].Will alert RNCM.  CSW signing off.  Werner Lean LCSW [phone]

## 2016-03-15 LAB — CBC
HEMATOCRIT: 28.9 % — AB (ref 36.0–46.0)
Hemoglobin: 9.2 g/dL — ABNORMAL LOW (ref 12.0–15.0)
MCH: 30.5 pg (ref 26.0–34.0)
MCHC: 31.8 g/dL (ref 30.0–36.0)
MCV: 95.7 fL (ref 78.0–100.0)
Platelets: 251 10*3/uL (ref 150–400)
RBC: 3.02 MIL/uL — AB (ref 3.87–5.11)
RDW: 16 % — ABNORMAL HIGH (ref 11.5–15.5)
WBC: 9 10*3/uL (ref 4.0–10.5)

## 2016-03-15 LAB — BASIC METABOLIC PANEL
ANION GAP: 11 (ref 5–15)
BUN: 55 mg/dL — ABNORMAL HIGH (ref 6–20)
CO2: 24 mmol/L (ref 22–32)
Calcium: 8.9 mg/dL (ref 8.9–10.3)
Chloride: 105 mmol/L (ref 101–111)
Creatinine, Ser: 4.36 mg/dL — ABNORMAL HIGH (ref 0.44–1.00)
GFR, EST AFRICAN AMERICAN: 11 mL/min — AB (ref >60)
GFR, EST NON AFRICAN AMERICAN: 10 mL/min — AB (ref >60)
GLUCOSE: 29 mg/dL — AB (ref 65–99)
POTASSIUM: 3.2 mmol/L — AB (ref 3.5–5.1)
Sodium: 140 mmol/L (ref 135–145)

## 2016-03-15 LAB — GLUCOSE, CAPILLARY
GLUCOSE-CAPILLARY: 178 mg/dL — AB (ref 65–99)
Glucose-Capillary: 26 mg/dL — CL (ref 65–99)
Glucose-Capillary: 105 mg/dL — ABNORMAL HIGH (ref 65–99)
Glucose-Capillary: 221 mg/dL — ABNORMAL HIGH (ref 65–99)
Glucose-Capillary: 287 mg/dL — ABNORMAL HIGH (ref 65–99)

## 2016-03-15 LAB — PROTIME-INR
INR: 2.94
Prothrombin Time: 31.3 seconds — ABNORMAL HIGH (ref 11.4–15.2)

## 2016-03-15 MED ORDER — POTASSIUM CHLORIDE CRYS ER 20 MEQ PO TBCR
20.0000 meq | EXTENDED_RELEASE_TABLET | Freq: Once | ORAL | Status: AC
  Administered 2016-03-15: 20 meq via ORAL
  Filled 2016-03-15: qty 1

## 2016-03-15 MED ORDER — WARFARIN SODIUM 5 MG PO TABS
5.0000 mg | ORAL_TABLET | Freq: Once | ORAL | Status: AC
  Administered 2016-03-15: 5 mg via ORAL
  Filled 2016-03-15: qty 1

## 2016-03-15 MED ORDER — INSULIN DETEMIR 100 UNIT/ML ~~LOC~~ SOLN
10.0000 [IU] | Freq: Every day | SUBCUTANEOUS | Status: DC
  Administered 2016-03-15 – 2016-03-16 (×2): 10 [IU] via SUBCUTANEOUS
  Filled 2016-03-15 (×2): qty 0.1

## 2016-03-15 MED ORDER — WARFARIN SODIUM 5 MG PO TABS: 5.0000 mg | ORAL_TABLET | Freq: Once | ORAL | Status: AC

## 2016-03-15 MED ORDER — DEXTROSE 50 % IV SOLN
INTRAVENOUS | Status: AC
  Administered 2016-03-15: 50 mL
  Filled 2016-03-15: qty 50

## 2016-03-15 NOTE — Progress Notes (Signed)
Physical Therapy Treatment Patient Details Name: Robin [name] MRN: [id number] DOB: Sep 22, 1950 Today's Date: 03/15/2016    History of Present Illness Pt admitted through ED with AMS and dx with CAP.  Pt with hx of CKD, bipolar, CAD, DM, and L ankle fusion 10/16.    PT Comments    Patient with some confusion this am; unsure of pt's baseline cognitive functioning and no family present during session. Feel she needs 24/7 S at home.; pt progressing slowly; SW note states dtr is declining SNF  Follow Up Recommendations  SNF;Home health PT;Supervision/Assistance - 24 hour (pt adamantly refuses SNF)     Equipment Recommendations  None recommended by PT    Recommendations for Other Services       Precautions / Restrictions Precautions Precautions: Fall Restrictions Weight Bearing Restrictions: No    Mobility  Bed Mobility Overal bed mobility: Needs Assistance Bed Mobility: Supine to Sit     Supine to sit: Min guard     General bed mobility comments: increased time and cues to focus on task  Transfers Overall transfer level: Needs assistance Equipment used: Rolling walker (2 wheeled) Transfers: Sit to/from Stand Sit to Stand: Min assist         General transfer comment: cues for safe transition position and use of UEs to self assist  Ambulation/Gait Ambulation/Gait assistance: +2 safety/equipment;Min assist Ambulation Distance (Feet): 40 Feet (20', 12') Assistive device: Rolling walker (2 wheeled) Gait Pattern/deviations: Step-through pattern;Narrow base of support;Drifts right/left Gait velocity: decr Gait velocity interpretation: Below normal speed for age/gender General Gait Details: Increased time with cues for posture and position from RW, requires assist to maneuver RW    Stairs            Wheelchair Mobility    Modified Rankin (Stroke Patients Only)       Balance   Sitting-balance support: Feet supported;Bilateral upper extremity supported        Standing balance support: During functional activity;Bilateral upper extremity supported Standing balance-Leahy Scale: Poor (reliant on UEs)                      Cognition Arousal/Alertness: Awake/alert Behavior During Therapy: Flat affect Overall Cognitive Status: No family/caregiver present to determine baseline cognitive functioning                      Exercises      General Comments        Pertinent Vitals/Pain Pain Assessment: No/denies pain    Home Living Family/patient expects to be discharged to:: Private residence Living Arrangements: Alone Available Help at Discharge: Family;Available PRN/intermittently Type of Home: House Home Access: Stairs to enter Entrance Stairs-Rails: Right;Left Home Layout: Multi-level;Able to live on main level with bedroom/bathroom Home Equipment: Gilford Rile - 2 wheels;Walker - 4 wheels;Wheelchair - [employer];Tub bench      Prior Function Level of Independence: Independent with assistive device(s)          PT Goals (current goals can now be found in the care plan section) Acute Rehab PT Goals Patient Stated Goal: Home PT Goal Formulation: With patient Time For Goal Achievement: 03/25/16 Potential to Achieve Goals: Fair Progress towards PT goals: Progressing toward goals    Frequency    Min 3X/week      PT Plan Current plan remains appropriate    Co-evaluation PT/OT/SLP Co-Evaluation/Treatment: Yes Reason for Co-Treatment: For patient/therapist safety PT goals addressed during session: Mobility/safety with mobility;Proper use of DME OT goals addressed  during session: ADL's and self-care     End of Session Equipment Utilized During Treatment: Gait belt Activity Tolerance: Patient limited by fatigue;Patient limited by pain Patient left: in chair;with call bell/phone within reach;with chair alarm set     Time: 6213-0865 PT Time Calculation (min) (ACUTE ONLY): 19 min  Charges:  $Gait  Training: 8-22 mins                    G Codes:      Sophiah Rolin 12-Apr-2016, 1:20 PM

## 2016-03-15 NOTE — Progress Notes (Signed)
Occupational Therapy Evaluation Patient Details Name: Robin [name] MRN: [id number] DOB: February 10, 1951 Today's Date: 03/15/2016    History of Present Illness Pt admitted through ED with AMS and dx with CAP.  Pt with hx of CKD, bipolar, CAD, DM, and L ankle fusion 10/16.   Clinical Impression   Patient with some confusion this am; unsure of pt's baseline cognitive functioning and no family present during session. Feel she needs 24/7 S at home. Saw social work note that she and her daughter declined SNF at this time. Recommend home with 24/7 S and [place]. OT will follow.    Follow Up Recommendations  Supervision/Assistance - 24 hour;Home health OT    Equipment Recommendations  None recommended by OT    Recommendations for Other Services       Precautions / Restrictions Precautions Precautions: Fall Restrictions Weight Bearing Restrictions: No      Mobility Bed Mobility Overal bed mobility: Needs Assistance Bed Mobility: Supine to Sit     Supine to sit: Min guard     General bed mobility comments: increased time and cues to focus on task  Transfers Overall transfer level: Needs assistance Equipment used: Rolling walker (2 wheeled) Transfers: Sit to/from Stand Sit to Stand: Min assist         General transfer comment: cues for safe transition position and use of UEs to self assist    Balance                                            ADL Overall ADL's : Needs assistance/impaired Eating/Feeding: Set up;Bed level   Grooming: Wash/dry hands;Minimal assistance;Standing               Lower Body Dressing: Set up Lower Body Dressing Details (indicate cue type and reason): don socks only Toilet Transfer: Minimal assistance;Ambulation;Comfort height toilet;RW;Grab bars   Toileting- Clothing Manipulation and Hygiene: Minimal assistance;Sitting/lateral lean       Functional mobility during ADLs: Minimal assistance;+2 for  safety/equipment;Rolling walker       Vision     Perception     Praxis      Pertinent Vitals/Pain Pain Assessment: No/denies pain     Hand Dominance     Extremity/Trunk Assessment Upper Extremity Assessment Upper Extremity Assessment: Generalized weakness   Lower Extremity Assessment Lower Extremity Assessment: Defer to PT evaluation       Communication Communication Communication: No difficulties   Cognition Arousal/Alertness: Awake/alert Behavior During Therapy: Flat affect Overall Cognitive Status: No family/caregiver present to determine baseline cognitive functioning                     General Comments       Exercises       Shoulder Instructions      Home Living Family/patient expects to be discharged to:: Private residence Living Arrangements: Alone Available Help at Discharge: Family;Available PRN/intermittently Type of Home: House Home Access: Stairs to enter [employer] of Steps: 3 Entrance Stairs-Rails: Right;Left Home Layout: Multi-level;Able to live on main level with bedroom/bathroom     Bathroom Shower/Tub: Teacher,  years/pre: Standard     Home Equipment: Environmental consultant - 2 wheels;Walker - 4 wheels;Wheelchair - [employer];Tub bench          Prior Functioning/Environment Level of Independence: Independent with assistive device(s)  OT Problem List: Decreased strength;Decreased range of motion;Decreased activity tolerance;Impaired balance (sitting and/or standing);Decreased safety awareness;Decreased cognition;Decreased knowledge of use of DME or AE   OT Treatment/Interventions: Self-care/ADL training;DME and/or AE instruction;Therapeutic activities;Patient/family education    OT Goals(Current goals can be found in the care plan section) Acute Rehab OT Goals Patient Stated Goal: Home OT Goal Formulation: With patient Time For Goal Achievement: 03/22/16 Potential to Achieve  Goals: Good ADL Goals Pt Will Perform Upper Body Bathing: with supervision;sitting Pt Will Perform Lower Body Bathing: with supervision;sit to/from stand Pt Will Perform Upper Body Dressing: with supervision;sitting Pt Will Perform Lower Body Dressing: with supervision;sit to/from stand Pt Will Transfer to Toilet: with supervision;ambulating;regular height toilet Pt Will Perform Toileting - Clothing Manipulation and hygiene: with supervision;sit to/from stand  OT Frequency: Min 2X/week   Barriers to D/C: Decreased caregiver support  lives alone       Co-evaluation PT/OT/SLP Co-Evaluation/Treatment: Yes Reason for Co-Treatment: For patient/therapist safety PT goals addressed during session: Mobility/safety with mobility OT goals addressed during session: ADL's and self-care      End of Session Equipment Utilized During Treatment: Gait belt;Rolling walker  Activity Tolerance: Patient tolerated treatment well Patient left: in chair;with call bell/phone within reach;with chair alarm set   Time: 1005-1025 OT Time Calculation (min): 20 min Charges:  OT General Charges $OT Visit: 1 Procedure OT Evaluation $OT Eval Low Complexity: 1 Procedure G-Codes:    Climmie Buelow A 04-04-16, 10:45 AM

## 2016-03-15 NOTE — Progress Notes (Signed)
Telephone call from Dr Pricilla Handler. Dr Pricilla Handler state okay for patient to be removed from droplet precautions. Will dc order

## 2016-03-15 NOTE — Progress Notes (Signed)
[facility] PROGRESS NOTE  Robin [name] EXN:[id number] DOB: Jun 01, 1950 DOA: 03/13/2016  PCP: [place]  Brief History/Interval Summary:  Robin [name] is a 65 y.o. female with a past medical history of coronary artery disease, recent history of DVT on warfarin, chronic kidney disease stage IV, presence of an AV fistula in the left arm, though not currently on dialysis, diabetes on insulin, bipolar disorder who presents to the hospital with complaints of shortness of breath and cough. These symptoms have been ongoing for about 5 days. Patient was diagnosed as having pneumonia. She was hospitalized for further management.  Reason for Visit: Community-acquired pneumonia  Consultants: None  Procedures: None  Antibiotics: Ceftriaxone and azithromycin  Subjective/Interval History: Patient had an episode of hypoglycemia earlier today. She was confused at that time. Seems to be much more awake and alert now. Her daughter is at the bedside. She feels that the patient is improving, but not close to baseline yet. Patient's cough is improved. She denies any chest pain or shortness of breath.   ROS: Denies nausea or vomiting  Objective:  Vital Signs  Vitals:   03/14/16 2144 03/14/16 2206 03/15/16 0528 03/15/16 0723  BP: 112/64  (!) 132/57   Pulse: 78  71   Resp: 17  16   Temp: 99 F (37.2 C)  98.4 F (36.9 C)   TempSrc: Oral  Axillary   SpO2: 96% 97% 100% 100%  Weight:      Height:        Intake/Output Summary (Last 24 hours) at 03/15/16 0902 Last data filed at 03/14/16 1333  Gross per 24 hour  Intake              120 ml  Output              300 ml  Net             -180 ml   Filed Weights   03/13/16 1410 03/13/16 1422 03/14/16 0533  Weight: 77.6 kg (171 lb) 77.1 kg (170 lb) 74.4 kg (164 lb)    General appearance: alert, cooperative, Distracted. In no distress. Resp: Improved air entry bilaterally. No wheezing heard today. Continues to have crackles  bilateral bases. right more than left. Cardio: regular rate and rhythm, S1, S2 normal, no murmur, click, rub or gallop GI: soft, non-tender; bowel sounds normal; no masses,  no organomegaly Extremities: extremities normal, atraumatic, no cyanosis or edema Neurologic: Awake and alert. Oriented to person, year, month. Didn't know she was in the hospital. She knew she was in [place], [place]. No cranial nerve deficits. Able to move all her extremities. Strength appears to be overall improved compared to yesterday.  Lab Results:  Data Reviewed: I have personally reviewed following labs and imaging studies  CBC:  Recent Labs Lab 03/13/16 1426 03/14/16 0501 03/15/16 0511  WBC 8.8 10.0 9.0  NEUTROABS 6.7  --   --   HGB 9.4* 8.5* 9.2*  HCT 29.0* 26.7* 28.9*  MCV 92.1 92.7 95.7  PLT 236 223 496    Basic Metabolic Panel:  Recent Labs Lab 03/13/16 1426 03/14/16 0501 03/15/16 0511  NA 135 138 140  K 3.3* 2.9* 3.2*  CL 101 104 105  CO2 23 24 24   GLUCOSE 224* 255* 29*  BUN 55* 54* 55*  CREATININE 4.32* 4.28* 4.36*  CALCIUM 9.0 8.8* 8.9    GFR: Estimated Creatinine Clearance: 13.1 mL/min (by C-G formula based on SCr of 4.36 mg/dL (  H)).  Liver Function Tests:  Recent Labs Lab 03/13/16 1426  AST 17  ALT 14  ALKPHOS 89  BILITOT 0.8  PROT 7.6  ALBUMIN 3.4*    Coagulation Profile:  Recent Labs Lab 03/13/16 1500 03/14/16 0501 03/15/16 0511  INR 3.18 2.88 2.94    CBG:  Recent Labs Lab 03/14/16 1209 03/14/16 1702 03/14/16 2147 03/15/16 0638 03/15/16 0701  GLUCAP 100* 72 111* 26* 178*     Recent Results (from the past 240 hour(s))  Culture, blood (Routine x 2)     Status: None (Preliminary result)   Collection Time: 03/13/16  2:29 PM  Result Value Ref Range Status   Specimen Description BLOOD RIGHT ARM  Final   Special Requests BOTTLES DRAWN AEROBIC AND ANAEROBIC 5 CC EA  Final   Culture   Final    NO GROWTH < 24 HOURS Performed at [facility]    Report Status PENDING  Incomplete  Culture, blood (Routine x 2)     Status: None (Preliminary result)   Collection Time: 03/13/16  3:25 PM  Result Value Ref Range Status   Specimen Description BLOOD RIGHT HAND  Final   Special Requests BOTTLES DRAWN AEROBIC ONLY 5CC  Final   Culture   Final    NO GROWTH < 24 HOURS Performed at [facility]    Report Status PENDING  Incomplete  Culture, sputum-assessment     Status: None   Collection Time: 03/14/16  8:27 AM  Result Value Ref Range Status   Specimen Description SPUTUM  Final   Special Requests NONE  Final   Sputum evaluation   Final    THIS SPECIMEN IS ACCEPTABLE. RESPIRATORY CULTURE REPORT TO FOLLOW.   Report Status 03/14/2016 FINAL  Final  Culture, respiratory (NON-Expectorated)     Status: None (Preliminary result)   Collection Time: 03/14/16  8:27 AM  Result Value Ref Range Status   Specimen Description SPUTUM  Final   Special Requests NONE  Final   Gram Stain   Final    MODERATE WBC PRESENT,BOTH PMN AND MONONUCLEAR NO ORGANISMS SEEN    Culture PENDING  Incomplete   Report Status PENDING  Incomplete      Radiology Studies: Dg Chest 2 View  Result Date: 03/13/2016 CLINICAL DATA:  Cough for 1 week, fever, smoking history EXAM: CHEST  2 VIEW COMPARISON:  Chest x-ray of 11/29/2015 FINDINGS: There is parenchymal opacity within the right upper lobe most consistent with pneumonia. Slight blunting of the left costophrenic angle may be chronic and could represent pleural thickening. Also there is moderate cardiomegaly present with possible mild pulmonary vascular congestion. Some fluid is noted in the fissures on the lateral view. No bony abnormality is seen. IMPRESSION: 1. Parenchymal opacity in the right upper lobe suspicious for pneumonia. 2. Moderate cardiomegaly with evidence of pulmonary vascular congestion. Question mild CHF. Electronically Signed   By: Ivar Drape M.D.   On: 03/13/2016 15:56      Medications:  Scheduled: . amLODipine  10 mg Oral Daily  . aspirin EC  81 mg Oral Daily  . atorvastatin  40 mg Oral QPM  . azithromycin  500 mg Intravenous Q24H  . benztropine  0.5 mg Oral BID  . bisacodyl  5 mg Oral Daily  . bumetanide  4 mg Oral BID  . carvedilol  25 mg Oral BID WC  . cefTRIAXone (ROCEPHIN)  IV  1 g Intravenous Q24H  . ferrous sulfate  325 mg Oral BID  WC  . fluPHENAZine  1 mg Oral QHS  . gabapentin  300 mg Oral Daily  . guaiFENesin  600 mg Oral BID  . hydrALAZINE  100 mg Oral BID  . insulin aspart  0-9 Units Subcutaneous TID WC  . insulin detemir  10 Units Subcutaneous QHS  . ipratropium-albuterol  3 mL Nebulization BID  . isosorbide mononitrate  60 mg Oral Q breakfast  . pantoprazole  20 mg Oral BID  . potassium chloride  20 mEq Oral Once  . QUEtiapine  200 mg Oral QHS  . sodium chloride flush  3 mL Intravenous Q12H  . warfarin  5 mg Oral ONCE-1800  . Warfarin - Pharmacist Dosing Inpatient   Does not apply q1800   Continuous:  MPN:TIRWER chloride, acetaminophen **OR** acetaminophen, albuterol, ondansetron **OR** ondansetron (ZOFRAN) IV, sodium chloride flush, traMADol  Assessment/Plan:  Principal Problem:   CAP (community acquired pneumonia) Active Problems:   DM type 2, uncontrolled, with renal complications ([place])   CAD (coronary artery disease), native coronary artery with 2 stents    Chronic combined systolic and diastolic CHF (congestive heart failure) (HCC)   Anemia in chronic kidney disease   Bipolar affective disorder (HCC)   Chronic renal disease, stage 4, severely decreased glomerular filtration rate between 15-29 mL/min/1.73 square meter (HCC)   Acute respiratory failure with hypoxemia (HCC)   Right leg DVT ([place])    Acute respiratory failure with hypoxia Most likely secondary to pneumonia. Oxygenation has improved with oxygen by nasal cannula. As her pneumonia is treated her oxygen requirement should improve. Patient's wheezing has  improved. Continue nebulizer treatments. Influenza PCR was negative.   Community-acquired pneumonia Continue ceftriaxone and azithromycin. Patient had normal lactic acid level. No evidence for sepsis at this time.   Generalized weakness and fatigue Fatigue. He appears to have improved today. Continue PT, OT evaluation. Patient and family declined skilled nursing facility placement. Most likely secondary to pneumonia.   Chronic kidney disease stage IV Creatinine is slightly higher than her baseline, although it would be difficult to call this acute renal failure. Continue with her Bumex and other medications. She should follow-up with her nephrologist. She does have AV fistula in the left arm, although she has not started dialysis yet.  History of coronary artery disease Stable. Continue aspirin. Continue beta blocker. EKG does not show any ischemic changes.  History of recent right leg DVT She is on warfarin. INR is therapeutic. This was diagnosed in July. She's completed about 3 months of treatment. Circumstances of this DVT are not entirely clear based on previous records. We'll continue warfarin for now. Defer discontinuation to her outpatient providers.  History of bipolar disorder/mild acute encephalopathy Continue with home medications. Patient appears to be mildly confused, which could be due to her acute illness. No focal neurologic deficits are noted on examination.  History of anemia of chronic disease Hemoglobin is close to baseline. No evidence for overt bleeding. Continue to monitor.   History of diabetes mellitus type 2 with renal complications Episode of hypoglycemia this morning, likely due to poor oral intake last night. Lower the dose of Lantus. Sliding scale coverage. Last HbA1c in our system is from August 2016 when it was 6.4.    DVT Prophylaxis: On warfarin    Code Status: Full code  Family Communication: Discussed with the patient. Discussed with her  daughter who is at the bedside. Disposition Plan: Patient continues to make slow progress. She declines to go to skilled nursing facility. Anticipate  transitioning to oral antibiotics tomorrow and possible discharge in 2-3 days.    LOS: 2 days   [place] [facility] Pager [phone] 03/15/2016, 9:02 AM  If 7PM-7AM, please contact night-coverage at [email or web address], password [name]

## 2016-03-15 NOTE — Progress Notes (Signed)
Hypoglycemic Event  CBG: 26  Treatment: D50 IV 50 mL  Symptoms: None  Follow-up CBG: Time:0700 CBG Result:178  Possible Reasons for Event: Unknown  Comments/MD notified:    Si Gaul [facility]

## 2016-03-15 NOTE — Progress Notes (Signed)
[place] for Warfarin Indication: hx  DVT  Allergies  Allergen Reactions  . Oxycodone Other (See Comments)    Makes patient lethargic, sleeping for few days, altered mental status   Patient Measurements: Height: 5\' 4"  (162.6 cm) Weight: 164 lb (74.4 kg) IBW/kg (Calculated) : 54.7  Vital Signs: Temp: 98.4 F (36.9 C) (10/21 0528) Temp Source: Axillary (10/21 0528) BP: 126/62 (10/21 1046) Pulse Rate: 71 (10/21 0528)  Labs:  Recent Labs  03/13/16 1426 03/13/16 1500 03/14/16 0501 03/15/16 0511  HGB 9.4*  --  8.5* 9.2*  HCT 29.0*  --  26.7* 28.9*  PLT 236  --  223 251  LABPROT  --  33.3* 30.7* 31.3*  INR  --  3.18 2.88 2.94  CREATININE 4.32*  --  4.28* 4.36*   Estimated Creatinine Clearance: 13.1 mL/min (by C-G formula based on SCr of 4.36 mg/dL (H)).  Medical History: Past Medical History:  Diagnosis Date  . Anemia   . Anxiety   . Arthritis   . Benign hypertension   . Bipolar disorder ([place])   . CHF (congestive heart failure) ([place])   . Chronic kidney disease, stage V [name])    Nephrologist is with [facility] (Dr. Tammi Klippel)  . Coronary artery disease   . Depression   . Diabetes mellitus without complication ([place])   . GERD (gastroesophageal reflux disease)   . Heart murmur   . History of blood transfusion   . History of bronchitis   . History of pneumonia   . Shortness of breath dyspnea   . Sleep apnea   . Type 2 diabetes mellitus (HCC)    Medications:  Scheduled:  . amLODipine  10 mg Oral Daily  . aspirin EC  81 mg Oral Daily  . atorvastatin  40 mg Oral QPM  . azithromycin  500 mg Intravenous Q24H  . benztropine  0.5 mg Oral BID  . bisacodyl  5 mg Oral Daily  . bumetanide  4 mg Oral BID  . carvedilol  25 mg Oral BID WC  . cefTRIAXone (ROCEPHIN)  IV  1 g Intravenous Q24H  . ferrous sulfate  325 mg Oral BID WC  . fluPHENAZine  1 mg Oral QHS  . gabapentin  300 mg Oral Daily  . guaiFENesin  600 mg Oral BID  .  hydrALAZINE  100 mg Oral BID  . insulin aspart  0-9 Units Subcutaneous TID WC  . insulin detemir  10 Units Subcutaneous QHS  . ipratropium-albuterol  3 mL Nebulization BID  . isosorbide mononitrate  60 mg Oral Q breakfast  . pantoprazole  20 mg Oral BID  . QUEtiapine  200 mg Oral QHS  . sodium chloride flush  3 mL Intravenous Q12H  . warfarin  5 mg Oral ONCE-1800  . Warfarin - Pharmacist Dosing Inpatient   Does not apply q1800   Assessment: Patient is a 65 y.o with hx DVT on warfarin PTA, presented to the ED on 10/20 with c/o SOB.  Warfarin resumed on admission.  INR was 3.18 on admission.  Patient's daughter stated that she cut back on her mom "greens" consumption a couple of weeks ago because her mom's INR was subtherapeutic at one point.  Reduction in vit K consumption may explain why she is more sensitive to her warfarin (with INR elevated on admit).  Home warfarin regimen: 7.5 mg daily except 5 mg on Tues and Thurs (verified regimen with daughter)  Today, 03/15/2016: - INR now therapeutic  at 2.94 - Unclear if last nights dose was administered, drug not charted against on MAR. Will dc order to ensure that there is no duplicate administration of medications today. - hgb up to  9.2, plt wnl - no bleeding documented - on carb modified diet - drug-drug intxns: on abx  Goal of Therapy:  INR 2-3 Monitor platelets by anticoagulation protocol: Yes   Plan:  - warfarin 5 mg PO x1 today - may see INR drop as drug may have not been administered last night - monitor for s/s bleeding - PT/INR ordered with AM labs    Royetta Asal, PharmD, BCPS Pager [phone] 03/15/2016 1:45 PM

## 2016-03-16 ENCOUNTER — Inpatient Hospital Stay (HOSPITAL_COMMUNITY): Payer: Medicare Other

## 2016-03-16 DIAGNOSIS — G9340 Encephalopathy, unspecified: Secondary | ICD-10-CM

## 2016-03-16 LAB — GLUCOSE, CAPILLARY
GLUCOSE-CAPILLARY: 295 mg/dL — AB (ref 65–99)
GLUCOSE-CAPILLARY: 407 mg/dL — AB (ref 65–99)

## 2016-03-16 LAB — BLOOD CULTURE ID PANEL (REFLEXED)
ACINETOBACTER BAUMANNII: NOT DETECTED
CANDIDA GLABRATA: NOT DETECTED
CANDIDA KRUSEI: NOT DETECTED
CANDIDA PARAPSILOSIS: NOT DETECTED
Candida albicans: NOT DETECTED
Candida tropicalis: NOT DETECTED
ENTEROBACTER CLOACAE COMPLEX: NOT DETECTED
ENTEROCOCCUS SPECIES: NOT DETECTED
ESCHERICHIA COLI: NOT DETECTED
Enterobacteriaceae species: NOT DETECTED
Haemophilus influenzae: NOT DETECTED
KLEBSIELLA OXYTOCA: NOT DETECTED
Klebsiella pneumoniae: NOT DETECTED
LISTERIA MONOCYTOGENES: NOT DETECTED
Neisseria meningitidis: NOT DETECTED
PROTEUS SPECIES: NOT DETECTED
PSEUDOMONAS AERUGINOSA: NOT DETECTED
SERRATIA MARCESCENS: NOT DETECTED
STREPTOCOCCUS PNEUMONIAE: NOT DETECTED
STREPTOCOCCUS PYOGENES: NOT DETECTED
Staphylococcus aureus (BCID): NOT DETECTED
Staphylococcus species: NOT DETECTED
Streptococcus agalactiae: NOT DETECTED
Streptococcus species: NOT DETECTED

## 2016-03-16 LAB — CULTURE, RESPIRATORY W GRAM STAIN: Culture: NORMAL

## 2016-03-16 LAB — CULTURE, RESPIRATORY

## 2016-03-16 LAB — PROTIME-INR
INR: 2.56
Prothrombin Time: 28 seconds — ABNORMAL HIGH (ref 11.4–15.2)

## 2016-03-16 MED ORDER — AZITHROMYCIN 250 MG PO TABS
500.0000 mg | ORAL_TABLET | ORAL | Status: DC
  Filled 2016-03-16 (×2): qty 1

## 2016-03-16 MED ORDER — CEFPODOXIME PROXETIL 200 MG PO TABS
200.0000 mg | ORAL_TABLET | ORAL | Status: DC
  Filled 2016-03-16 (×2): qty 1

## 2016-03-16 MED ORDER — WARFARIN SODIUM 5 MG PO TABS
5.0000 mg | ORAL_TABLET | Freq: Once | ORAL | Status: AC
  Administered 2016-03-16: 5 mg via ORAL
  Filled 2016-03-16: qty 1

## 2016-03-16 MED ORDER — INSULIN ASPART 100 UNIT/ML ~~LOC~~ SOLN
5.0000 [IU] | Freq: Once | SUBCUTANEOUS | Status: AC
  Administered 2016-03-16: 5 [IU] via SUBCUTANEOUS

## 2016-03-16 NOTE — Progress Notes (Signed)
[facility] PROGRESS NOTE  Robin [name] INO:[id number] DOB: [birth year] DOA: 03/13/2016  PCP: [place]  Brief History/Interval Summary:  Robin [name] is a 65 y.o. female with a past medical history of coronary artery disease, recent history of DVT on warfarin, chronic kidney disease stage IV, presence of an AV fistula in the left arm, though not currently on dialysis, diabetes on insulin, bipolar disorder who presents to the hospital with complaints of shortness of breath and cough. These symptoms have been ongoing for about 5 days. Patient was diagnosed as having pneumonia. She was hospitalized for further management.  Reason for Visit: Community-acquired pneumonia  Consultants: None  Procedures: None  Antibiotics: Ceftriaxone and azithromycin changed to oral Vantin and azithromycin. On 10/22  Subjective/Interval History: Patient feels well this morning. She remains distracted and mildly confused. Patient states that cough is better. Denies any shortness of breath or chest pains.   ROS: Denies nausea or vomiting  Objective:  Vital Signs  Vitals:   03/15/16 2117 03/16/16 0627 03/16/16 0745 03/16/16 0749  BP: (!) 133/58 (!) 152/82 (!) 136/58   Pulse: 87 84 78   Resp: 16 18 19    Temp: 99.4 F (37.4 C) 98.8 F (37.1 C) 99.1 F (37.3 C)   TempSrc: Oral Oral Oral   SpO2: 94% 95% 99% 98%  Weight:      Height:        Intake/Output Summary (Last 24 hours) at 03/16/16 0858 Last data filed at 03/16/16 0962  Gross per 24 hour  Intake              780 ml  Output             1002 ml  Net             -222 ml   Filed Weights   03/13/16 1410 03/13/16 1422 03/14/16 0533  Weight: 77.6 kg (171 lb) 77.1 kg (170 lb) 74.4 kg (164 lb)    General appearance: alert, cooperative, Distracted. In no distress. Resp: Air entry has improved significantly. No wheezing. No rhonchi. Few crackles at the bases.  Cardio: regular rate and rhythm, S1, S2 normal, no murmur,  click, rub or gallop GI: soft, non-tender; bowel sounds normal; no masses,  no organomegaly Extremities: extremities normal, atraumatic, no cyanosis or edema Neurologic: Awake and alert. Oriented to person, year, month. Remains somewhat disoriented. Tongue is midline. No facial asymmetry. Moving all her extremities with equal strength bilaterally.   Lab Results:  Data Reviewed: I have personally reviewed following labs and imaging studies  CBC:  Recent Labs Lab 03/13/16 1426 03/14/16 0501 03/15/16 0511  WBC 8.8 10.0 9.0  NEUTROABS 6.7  --   --   HGB 9.4* 8.5* 9.2*  HCT 29.0* 26.7* 28.9*  MCV 92.1 92.7 95.7  PLT 236 223 836    Basic Metabolic Panel:  Recent Labs Lab 03/13/16 1426 03/14/16 0501 03/15/16 0511  NA 135 138 140  K 3.3* 2.9* 3.2*  CL 101 104 105  CO2 23 24 24   GLUCOSE 224* 255* 29*  BUN 55* 54* 55*  CREATININE 4.32* 4.28* 4.36*  CALCIUM 9.0 8.8* 8.9    GFR: Estimated Creatinine Clearance: 13.1 mL/min (by C-G formula based on SCr of 4.36 mg/dL (H)).  Liver Function Tests:  Recent Labs Lab 03/13/16 1426  AST 17  ALT 14  ALKPHOS 89  BILITOT 0.8  PROT 7.6  ALBUMIN 3.4*    Coagulation Profile:  Recent Labs  Lab 03/13/16 1500 03/14/16 0501 03/15/16 0511 03/16/16 0513  INR 3.18 2.88 2.94 2.56    CBG:  Recent Labs Lab 03/15/16 0701 03/15/16 1145 03/15/16 1733 03/15/16 2121 03/16/16 0746  GLUCAP 178* 105* 221* 287* 295*     Recent Results (from the past 240 hour(s))  Culture, blood (Routine x 2)     Status: None (Preliminary result)   Collection Time: 03/13/16  2:29 PM  Result Value Ref Range Status   Specimen Description BLOOD RIGHT ARM  Final   Special Requests BOTTLES DRAWN AEROBIC AND ANAEROBIC 5 CC EA  Final   Culture   Final    NO GROWTH 2 DAYS Performed at [facility]    Report Status PENDING  Incomplete  Culture, blood (Routine x 2)     Status: None (Preliminary result)   Collection Time: 03/13/16  3:25 PM    Result Value Ref Range Status   Specimen Description BLOOD RIGHT HAND  Final   Special Requests BOTTLES DRAWN AEROBIC ONLY 5CC  Final   Culture   Final    NO GROWTH 2 DAYS Performed at [facility]    Report Status PENDING  Incomplete  Culture, sputum-assessment     Status: None   Collection Time: 03/14/16  8:27 AM  Result Value Ref Range Status   Specimen Description SPUTUM  Final   Special Requests NONE  Final   Sputum evaluation   Final    THIS SPECIMEN IS ACCEPTABLE. RESPIRATORY CULTURE REPORT TO FOLLOW.   Report Status 03/14/2016 FINAL  Final  Culture, respiratory (NON-Expectorated)     Status: None   Collection Time: 03/14/16  8:27 AM  Result Value Ref Range Status   Specimen Description SPUTUM  Final   Special Requests NONE  Final   Gram Stain   Final    MODERATE WBC PRESENT,BOTH PMN AND MONONUCLEAR NO ORGANISMS SEEN    Culture   Final    Consistent with normal respiratory flora. Performed at [name]    Report Status 03/16/2016 FINAL  Final      Radiology Studies: No results found.   Medications:  Scheduled: . amLODipine  10 mg Oral Daily  . aspirin EC  81 mg Oral Daily  . atorvastatin  40 mg Oral QPM  . azithromycin  500 mg Oral Daily  . benztropine  0.5 mg Oral BID  . bisacodyl  5 mg Oral Daily  . bumetanide  4 mg Oral BID  . carvedilol  25 mg Oral BID WC  . cefpodoxime  200 mg Oral Daily  . ferrous sulfate  325 mg Oral BID WC  . fluPHENAZine  1 mg Oral QHS  . gabapentin  300 mg Oral Daily  . guaiFENesin  600 mg Oral BID  . hydrALAZINE  100 mg Oral BID  . insulin aspart  0-9 Units Subcutaneous TID WC  . insulin detemir  10 Units Subcutaneous QHS  . ipratropium-albuterol  3 mL Nebulization BID  . isosorbide mononitrate  60 mg Oral Q breakfast  . pantoprazole  20 mg Oral BID  . QUEtiapine  200 mg Oral QHS  . sodium chloride flush  3 mL Intravenous Q12H  . Warfarin - Pharmacist Dosing Inpatient   Does not apply q1800    Continuous:  FKC:LEXNTZ chloride, acetaminophen **OR** acetaminophen, albuterol, ondansetron **OR** ondansetron (ZOFRAN) IV, sodium chloride flush, traMADol  Assessment/Plan:  Principal Problem:   CAP (community acquired pneumonia) Active Problems:   DM type 2, uncontrolled, with  renal complications (HCC)   CAD (coronary artery disease), native coronary artery with 2 stents    Chronic combined systolic and diastolic CHF (congestive heart failure) (HCC)   Anemia in chronic kidney disease   Bipolar affective disorder (HCC)   Chronic renal disease, stage 4, severely decreased glomerular filtration rate between 15-29 mL/min/1.73 square meter (HCC)   Acute respiratory failure with hypoxemia (HCC)   Right leg DVT (HCC)    Acute respiratory failure with hypoxia Most likely secondary to pneumonia. She is much improved. Now saturating normal on room air. Patient's wheezing has improved. Continue nebulizer treatments when necessary. Influenza PCR was negative.   Community-acquired pneumonia [place] has improved. Change to oral antibiotics. Patient had normal lactic acid level. No evidence for sepsis at this time.   Generalized weakness and fatigue Patient continues to show improvement.  Patient in by physical therapy and thought to need skilled nursing facility, but she declines. Family has good support system.    Chronic kidney disease stage IV. Hypokalemia Creatinine is slightly higher than her baseline, although it would be difficult to call this acute renal failure. Continue with her Bumex and other medications. She should follow-up with her nephrologist. She does have AV fistula in the left arm, although she has not started dialysis yet. Potassium was repleted. Repeat labs tomorrow morning.  History of coronary artery disease Stable. Continue aspirin. Continue beta blocker. EKG does not show any ischemic changes.  History of recent right leg DVT She is on warfarin. INR is  therapeutic. This was diagnosed in July. She's completed about 3 months of treatment. Circumstances of this DVT are not entirely clear based on previous records. We'll continue warfarin for now. Defer discontinuation to her outpatient providers.  History of bipolar disorder/mild acute encephalopathy Continue with home medications. Patient appears to be mildly confused, which could be due to her acute illness. No focal neurologic deficits are noted on examination. However, family is very concerned about her persistent confusion. Proceed with MRI of brain.  History of anemia of chronic disease Hemoglobin is close to baseline. No evidence for overt bleeding. Continue to monitor.   History of diabetes mellitus type 2 with renal complications Further episodes of hypoglycemia. Continue insulin at current dose. Monitor CBGs closely. Sliding scale coverage. Last HbA1c in our system is from August 2016 when it was 6.4.    DVT Prophylaxis: On warfarin    Code Status: Full code  Family Communication: Discussed with the patient.  Disposition Plan: Patient continues to make progress. She declines to go to skilled nursing facility. Change to oral antibiotics today. MRI brain today. Anticipate discharge in next 1-2 days.    LOS: 3 days   [place] [facility] Pager [phone] 03/16/2016, 8:58 AM  If 7PM-7AM, please contact night-coverage at [email or web address], password [facility]

## 2016-03-16 NOTE — Progress Notes (Addendum)
Nurse went into patients room to give patient night medications. Patient is asleep. Patient is able to be aroused, open her eyes and speaks to nurse and closes eyes and goes back to sleep. Patient will not stay awake long enough to take medications. Nurse did give Levemir and Novolog. Nurse will page provider on call to make aware of patient not able to take medications at this time.   Nurse paged on call provider, [place].

## 2016-03-16 NOTE — Progress Notes (Signed)
[place] for Warfarin Indication: hx  DVT  Allergies  Allergen Reactions  . Oxycodone Other (See Comments)    Makes patient lethargic, sleeping for few days, altered mental status   Patient Measurements: Height: 5\' 4"  (162.6 cm) Weight: 164 lb (74.4 kg) IBW/kg (Calculated) : 54.7  Vital Signs: Temp: 99.1 F (37.3 C) (10/22 0745) Temp Source: Oral (10/22 0745) BP: 136/58 (10/22 0745) Pulse Rate: 78 (10/22 0745)  Labs:  Recent Labs  03/13/16 1426  03/14/16 0501 03/15/16 0511 03/16/16 0513  HGB 9.4*  --  8.5* 9.2*  --   HCT 29.0*  --  26.7* 28.9*  --   PLT 236  --  223 251  --   LABPROT  --   < > 30.7* 31.3* 28.0*  INR  --   < > 2.88 2.94 2.56  CREATININE 4.32*  --  4.28* 4.36*  --   < > = values in this interval not displayed. Estimated Creatinine Clearance: 13.1 mL/min (by C-G formula based on SCr of 4.36 mg/dL (H)).  Medical History: Past Medical History:  Diagnosis Date  . Anemia   . Anxiety   . Arthritis   . Benign hypertension   . Bipolar disorder ([place])   . CHF (congestive heart failure) ([place])   . Chronic kidney disease, stage V [name])    Nephrologist is with [facility] (Dr. Tammi Klippel)  . Coronary artery disease   . Depression   . Diabetes mellitus without complication ([place])   . GERD (gastroesophageal reflux disease)   . Heart murmur   . History of blood transfusion   . History of bronchitis   . History of pneumonia   . Shortness of breath dyspnea   . Sleep apnea   . Type 2 diabetes mellitus ([facility])    Medications:  Scheduled:  . amLODipine  10 mg Oral Daily  . aspirin EC  81 mg Oral Daily  . atorvastatin  40 mg Oral QPM  . azithromycin  500 mg Oral Q24H  . benztropine  0.5 mg Oral BID  . bisacodyl  5 mg Oral Daily  . bumetanide  4 mg Oral BID  . carvedilol  25 mg Oral BID WC  . cefpodoxime  200 mg Oral Q24H  . ferrous sulfate  325 mg Oral BID WC  . fluPHENAZine  1 mg Oral QHS  . gabapentin  300 mg Oral  Daily  . guaiFENesin  600 mg Oral BID  . hydrALAZINE  100 mg Oral BID  . insulin aspart  0-9 Units Subcutaneous TID WC  . insulin detemir  10 Units Subcutaneous QHS  . ipratropium-albuterol  3 mL Nebulization BID  . isosorbide mononitrate  60 mg Oral Q breakfast  . pantoprazole  20 mg Oral BID  . QUEtiapine  200 mg Oral QHS  . sodium chloride flush  3 mL Intravenous Q12H  . Warfarin - Pharmacist Dosing Inpatient   Does not apply q1800   Assessment: Patient is a 65 y.o with hx DVT on warfarin PTA, presented to the ED on 10/20 with c/o SOB.  Warfarin resumed on admission.  INR was 3.18 on admission.  Patient's daughter stated that she cut back on her mom "greens" consumption a couple of weeks ago because her mom's INR was subtherapeutic at one point.  Reduction in vit K consumption may explain why she is more sensitive to her warfarin (with INR elevated on admit).  Home warfarin regimen: 7.5 mg daily except 5  mg on Tues and Thurs (verified regimen with daughter)  10/21: unclear if dose that was scheduled on 10/20 was given. Med has not been charted against  Today, 03/16/2016: - INR therapeutic at 2.56 - hgb up to  9.2, plt wnl ( 10/21) - no bleeding documented - on carb modified diet - drug-drug intxns: on abx  Goal of Therapy:  INR 2-3 Monitor platelets by anticoagulation protocol: Yes   Plan:  - warfarin 5 mg PO x1 today - monitor for s/s bleeding - PT/INR ordered with AM labs    Royetta Asal, PharmD, BCPS Pager [phone] 03/16/2016 9:05 AM

## 2016-03-16 NOTE — Progress Notes (Signed)
Physical Therapy Treatment Patient Details Name: Robin [name] MRN: [id number] DOB: [birth year] Today's Date: 03-25-2016    History of Present Illness Pt admitted through ED with AMS and dx with CAP.  Pt with hx of CKD, bipolar, CAD, DM, and L ankle fusion 10/16.    PT Comments    Pt progressing, incr gaitt distance/tolerance--  Although continues to fatigue and require seated rest; pt oriented x3 today  Follow Up Recommendations  SNF;Home health PT;Supervision/Assistance - 24 hour (refuses SNF)     Equipment Recommendations  None recommended by PT    Recommendations for Other Services       Precautions / Restrictions Precautions Precautions: Fall Restrictions Weight Bearing Restrictions: No    Mobility  Bed Mobility Overal bed mobility: Needs Assistance Bed Mobility: Supine to Sit     Supine to sit: Supervision     General bed mobility comments: increased time and cues to focus on task  Transfers Overall transfer level: Needs assistance Equipment used: Rolling walker (2 wheeled) Transfers: Sit to/from Stand Sit to Stand: Min guard         General transfer comment: cues for safe transition position and use of UEs to self assist  Ambulation/Gait Ambulation/Gait assistance: Min assist Ambulation Distance (Feet): 70 Feet Assistive device: Rolling walker (2 wheeled) Gait Pattern/deviations: Narrow base of support;Drifts right/left;Step-through pattern;Decreased stride length   Gait velocity interpretation: Below normal speed for age/gender General Gait Details: Increased time with cues for posture and position from RW, requires intermittent assist to maneuver RW    Stairs            Wheelchair Mobility    Modified Rankin (Stroke Patients Only)       Balance           Standing balance support: During functional activity;Bilateral upper extremity supported Standing balance-Leahy Scale: Poor (reliant on UEs for balance)                       Cognition Arousal/Alertness: Awake/alert Behavior During Therapy: Flat affect Overall Cognitive Status: No family/caregiver present to determine baseline cognitive functioning                      Exercises      General Comments        Pertinent Vitals/Pain Pain Assessment: No/denies pain    Home Living                      Prior Function            PT Goals (current goals can now be found in the care plan section) Acute Rehab PT Goals Patient Stated Goal: Home PT Goal Formulation: With patient Time For Goal Achievement: 03/25/16 Potential to Achieve Goals: Fair Progress towards PT goals: Progressing toward goals    Frequency    Min 3X/week      PT Plan Current plan remains appropriate    Co-evaluation             End of Session Equipment Utilized During Treatment: Gait belt Activity Tolerance: Patient tolerated treatment well Patient left: in chair;with call bell/phone within reach;with chair alarm set     Time: 1020-1037 PT Time Calculation (min) (ACUTE ONLY): 17 min  Charges:  $Gait Training: 8-22 mins                    G Codes:      Mckinzy Fuller Mar 25, 2016, 11:47  AM

## 2016-03-16 NOTE — Progress Notes (Signed)
Nurse paged on call hospitalist, [place]. Schorr returned call to nurse. Schorr is aware of patients blood glucose as follows: 66 and 75 at lunch, 290 at supper and patient received 5 units of insulin, and current blood glucose is 407. Per Schorr, order will be placed to give patient 5 units of Novolog.

## 2016-03-16 NOTE — Progress Notes (Signed)
PHARMACY - PHYSICIAN COMMUNICATION CRITICAL VALUE ALERT - BLOOD CULTURE IDENTIFICATION (BCID)  Results for orders placed or performed during the hospital encounter of 03/13/16  Blood Culture ID Panel (Reflexed) (Collected: 03/13/2016  3:25 PM)  Result Value Ref Range   Enterococcus species NOT DETECTED NOT DETECTED   Listeria monocytogenes NOT DETECTED NOT DETECTED   Staphylococcus species NOT DETECTED NOT DETECTED   Staphylococcus aureus NOT DETECTED NOT DETECTED   Streptococcus species NOT DETECTED NOT DETECTED   Streptococcus agalactiae NOT DETECTED NOT DETECTED   Streptococcus pneumoniae NOT DETECTED NOT DETECTED   Streptococcus pyogenes NOT DETECTED NOT DETECTED   Acinetobacter baumannii NOT DETECTED NOT DETECTED   Enterobacteriaceae species NOT DETECTED NOT DETECTED   Enterobacter cloacae complex NOT DETECTED NOT DETECTED   Escherichia coli NOT DETECTED NOT DETECTED   Klebsiella oxytoca NOT DETECTED NOT DETECTED   Klebsiella pneumoniae NOT DETECTED NOT DETECTED   Proteus species NOT DETECTED NOT DETECTED   Serratia marcescens NOT DETECTED NOT DETECTED   Haemophilus influenzae NOT DETECTED NOT DETECTED   Neisseria meningitidis NOT DETECTED NOT DETECTED   Pseudomonas aeruginosa NOT DETECTED NOT DETECTED   Candida albicans NOT DETECTED NOT DETECTED   Candida glabrata NOT DETECTED NOT DETECTED   Candida krusei NOT DETECTED NOT DETECTED   Candida parapsilosis NOT DETECTED NOT DETECTED   Candida tropicalis NOT DETECTED NOT DETECTED    Name of physician (or Provider) Contacted: Chaney Malling, NP  Changes to prescribed antibiotics required: continue current regimen   Royetta Asal, PharmD, BCPS Pager [phone] 03/16/2016 8:02 PM

## 2016-03-16 NOTE — Progress Notes (Signed)
cbg = 66 @ 1255. Lunch tray had been delivered & pt instructed by NT to start eating it. Recheck at 1318 was 75. Bradlee Heitman, [employer]

## 2016-03-17 LAB — PROTIME-INR
INR: 2.34
PROTHROMBIN TIME: 26.1 s — AB (ref 11.4–15.2)

## 2016-03-17 LAB — BASIC METABOLIC PANEL
Anion gap: 10 (ref 5–15)
BUN: 60 mg/dL — ABNORMAL HIGH (ref 6–20)
CHLORIDE: 106 mmol/L (ref 101–111)
CO2: 25 mmol/L (ref 22–32)
Calcium: 8.7 mg/dL — ABNORMAL LOW (ref 8.9–10.3)
Creatinine, Ser: 4.16 mg/dL — ABNORMAL HIGH (ref 0.44–1.00)
GFR calc non Af Amer: 10 mL/min — ABNORMAL LOW (ref >60)
GFR, EST AFRICAN AMERICAN: 12 mL/min — AB (ref >60)
Glucose, Bld: 205 mg/dL — ABNORMAL HIGH (ref 65–99)
POTASSIUM: 3.6 mmol/L (ref 3.5–5.1)
SODIUM: 141 mmol/L (ref 135–145)

## 2016-03-17 LAB — IRON AND TIBC
Iron: 26 ug/dL — ABNORMAL LOW (ref 28–170)
SATURATION RATIOS: 13 % (ref 10.4–31.8)
TIBC: 196 ug/dL — AB (ref 250–450)
UIBC: 170 ug/dL

## 2016-03-17 LAB — CBC
HEMATOCRIT: 26.3 % — AB (ref 36.0–46.0)
HEMOGLOBIN: 8.3 g/dL — AB (ref 12.0–15.0)
MCH: 29.7 pg (ref 26.0–34.0)
MCHC: 31.6 g/dL (ref 30.0–36.0)
MCV: 94.3 fL (ref 78.0–100.0)
Platelets: 231 10*3/uL (ref 150–400)
RBC: 2.79 MIL/uL — AB (ref 3.87–5.11)
RDW: 15.7 % — ABNORMAL HIGH (ref 11.5–15.5)
WBC: 5.5 10*3/uL (ref 4.0–10.5)

## 2016-03-17 LAB — CULTURE, BLOOD (ROUTINE X 2)

## 2016-03-17 LAB — RETICULOCYTES
RBC.: 2.79 MIL/uL — ABNORMAL LOW (ref 3.87–5.11)
Retic Count, Absolute: 30.7 10*3/uL (ref 19.0–186.0)
Retic Ct Pct: 1.1 % (ref 0.4–3.1)

## 2016-03-17 LAB — GLUCOSE, CAPILLARY
GLUCOSE-CAPILLARY: 281 mg/dL — AB (ref 65–99)
GLUCOSE-CAPILLARY: 196 mg/dL — AB (ref 65–99)
Glucose-Capillary: 267 mg/dL — ABNORMAL HIGH (ref 65–99)

## 2016-03-17 LAB — VITAMIN B12: VITAMIN B 12: 579 pg/mL (ref 180–914)

## 2016-03-17 LAB — FOLATE: FOLATE: 10.6 ng/mL (ref >5.9)

## 2016-03-17 LAB — FERRITIN: Ferritin: 123 ng/mL (ref 11–307)

## 2016-03-17 LAB — TSH: TSH: 1.527 u[IU]/mL (ref 0.350–4.500)

## 2016-03-17 MED ORDER — GUAIFENESIN ER 600 MG PO TB12: 600.0000 mg | ORAL_TABLET | Freq: Two times a day (BID) | ORAL | 0 refills | Status: DC

## 2016-03-17 MED ORDER — CEFPODOXIME PROXETIL 200 MG PO TABS
200.0000 mg | ORAL_TABLET | ORAL | 0 refills | Status: AC
Start: 2016-03-17 — End: 2016-03-21

## 2016-03-17 MED ORDER — WARFARIN SODIUM 5 MG PO TABS: 5.0000 mg | ORAL_TABLET | Freq: Once | ORAL | Status: DC

## 2016-03-17 MED ORDER — WARFARIN SODIUM 5 MG PO TABS: 5.0000 mg | ORAL_TABLET | Freq: Every day | ORAL | Status: DC

## 2016-03-17 MED ORDER — AZITHROMYCIN 500 MG PO TABS: 500.0000 mg | ORAL_TABLET | Freq: Every day | ORAL | 0 refills | Status: AC

## 2016-03-17 NOTE — Progress Notes (Signed)
Pt in chair pulled iv out, chair pad alarm on and activated.  D FranklinRN

## 2016-03-17 NOTE — Care Management Important Message (Signed)
Important Message  Patient Details  Name: Robin [name] MRN: [id number] Date of Birth: Sep 15, 1950   Medicare Important Message Given:  Yes    Camillo Flaming 03/17/2016, 10:34 AMImportant Message  Patient Details  Name: Robin [name] MRN: [id number] Date of Birth: 09-30-1950   Medicare Important Message Given:  Yes    Camillo Flaming 03/17/2016, 10:34 AM

## 2016-03-17 NOTE — Progress Notes (Signed)
Spoke with patient's daughter, Judson Roch, on the phone. Discussed the need for [facility] services, she has used [facility] in the past for [name] services and would like to use them again. She also has Nature conservation officer for [employer]. Contacted [facility] for referral.

## 2016-03-17 NOTE — Discharge Instructions (Signed)

## 2016-03-17 NOTE — Discharge Summary (Signed)
[facility]  Physician Discharge Summary   [name] MRN: [id number] DOB/AGE: 1950/08/08 65 y.o.  Admit date: 03/13/2016 Discharge date: 03/17/2016  PCP: [place]:  Principal Problem:   CAP (community acquired pneumonia) Active Problems:   DM type 2, uncontrolled, with renal complications ([place])   CAD (coronary artery disease), native coronary artery with 2 stents    Chronic combined systolic and diastolic CHF (congestive heart failure) (HCC)   Anemia in chronic kidney disease   Bipolar affective disorder (HCC)   Chronic renal disease, stage 4, severely decreased glomerular filtration rate between 15-29 mL/min/1.73 square meter (HCC)   Acute respiratory failure with hypoxemia (HCC)   Right leg DVT (HCC)   RECOMMENDATIONS FOR OUTPATIENT FOLLOW UP: 1. Daughter to arrange follow-up with the [place] for PT/INR check.   DISCHARGE CONDITION: fair  Diet recommendation: Modified carbohydrate  Filed Weights   03/13/16 1410 03/13/16 1422 03/14/16 0533  Weight: 77.6 kg (171 lb) 77.1 kg (170 lb) 74.4 kg (164 lb)    INITIAL HISTORY: Robin [name] a 65 y.o.femalewith a past medical history of coronary artery disease, recent history of DVT on warfarin, chronic kidney disease stage IV, presence of an AV fistula in the left arm, though not currently on dialysis, diabetes on insulin, bipolar disorder who presents to the hospital with complaints of shortness of breath and cough. These symptoms have been ongoing for about 5 days. Patient was diagnosed as having pneumonia. She was hospitalized for further management.   HOSPITAL COURSE:   Acute respiratory failure with hypoxia Most likely secondary to pneumonia. She is much improved. Now saturating normal on room air. Patient's wheezing has improved. Influenza PCR was negative.   Community-acquired pneumonia Patient has improved. She was initially placed on ceftriaxone and  azithromycin. She was changed over to oral antibiotics. She had a normal lactic acid level. No evidence for sepsis.   Generalized weakness and fatigue Patient was fatigued at the time of admission. This is most likely due to her acute infection. She gradually improved. She, however, physical therapy. Short-term rehabilitation was recommended at a skilled nursing facility. However, patient and family declined. Home health will be ordered. Patient has good support system.   Chronic kidney disease stage IV. Hypokalemia Creatinine is slightly higher than her baseline, although it would be difficult to call this acute renal failure. Continue with her Bumex and other medications. She should follow-up with her nephrologist. She does have AV fistula in the left arm, although she has not started dialysis yet.   History of coronary artery disease Stable. Continue aspirin. Continue beta blocker. EKG did not show any ischemic changes.  History of recent right leg DVT She is on warfarin. INR is therapeutic. This was diagnosed in July. She's completed about 3 months of treatment. Circumstances of this DVT are not entirely clear based on previous records. We'll continue warfarin for now. Defer discontinuation to her outpatient providers. This was discussed with the patient's daughter. She will discuss with patient's primary care provider at the [place]. She will arrange for follow-up at the [facility] for INR check. Her INR was supratherapeutic at the time of admission. I changed her to just 5 mg daily and asked her not to take the 7.5 mg that she was taking on Tuesdays and Thursdays.  History of bipolar disorder/mild acute encephalopathy Patient did have some confusion during this hospitalization. Has improved. She did not have any focal neurological deficits. Family was very concerned.  MRI brain was ordered which did not show any acute stroke. Patient and family reassured. Her chronic kidney disease also be contributing,  although her BUN is at baseline.   History of anemia of chronic disease Hemoglobin is close to baseline. No evidence for overt bleeding. Continue to monitor. Does get Aranesp as outpatient.  History of diabetes mellitus type 2 with renal complications CBGs have been stable. She did have a few episodes of hypoglycemia which was secondary to poor oral intake. Continue with home medication regimen. Continue monitoring at home.   Overall, stable. Patient significantly improved. She may continue rest of her treatment at home. Okay for discharge today.   PERTINENT LABS:  The results of significant diagnostics from this hospitalization (including imaging, microbiology, ancillary and laboratory) are listed below for reference.    Microbiology: Recent Results (from the past 240 hour(s))  Culture, blood (Routine x 2)     Status: None (Preliminary result)   Collection Time: 03/13/16  2:29 PM  Result Value Ref Range Status   Specimen Description BLOOD RIGHT ARM  Final   Special Requests BOTTLES DRAWN AEROBIC AND ANAEROBIC 5 CC EA  Final   Culture   Final    NO GROWTH 3 DAYS Performed at [facility]    Report Status PENDING  Incomplete  Culture, blood (Routine x 2)     Status: Abnormal   Collection Time: 03/13/16  3:25 PM  Result Value Ref Range Status   Specimen Description BLOOD RIGHT HAND  Final   Special Requests BOTTLES DRAWN AEROBIC ONLY 5CC  Final   Culture  Setup Time   Final    GRAM POSITIVE RODS AEROBIC BOTTLE ONLY CRITICAL RESULT CALLED TO, READ BACK BY AND VERIFIED WITH: C SUMME,PHARMD AT [facility] 03/16/16 BY L BENFIELD    Culture (A)  Final    DIPHTHEROIDS(CORYNEBACTERIUM SPECIES) Standardized susceptibility testing for this organism is not available. Performed at [name]    Report Status 03/17/2016 FINAL  Final  Blood Culture ID Panel (Reflexed)     Status: None   Collection Time: 03/13/16  3:25 PM  Result Value Ref Range Status   Enterococcus species  NOT DETECTED NOT DETECTED Final   Listeria monocytogenes NOT DETECTED NOT DETECTED Final   Staphylococcus species NOT DETECTED NOT DETECTED Final   Staphylococcus aureus NOT DETECTED NOT DETECTED Final   Streptococcus species NOT DETECTED NOT DETECTED Final   Streptococcus agalactiae NOT DETECTED NOT DETECTED Final   Streptococcus pneumoniae NOT DETECTED NOT DETECTED Final   Streptococcus pyogenes NOT DETECTED NOT DETECTED Final   Acinetobacter baumannii NOT DETECTED NOT DETECTED Final   Enterobacteriaceae species NOT DETECTED NOT DETECTED Final   Enterobacter cloacae complex NOT DETECTED NOT DETECTED Final   Escherichia coli NOT DETECTED NOT DETECTED Final   Klebsiella oxytoca NOT DETECTED NOT DETECTED Final   Klebsiella pneumoniae NOT DETECTED NOT DETECTED Final   Proteus species NOT DETECTED NOT DETECTED Final   Serratia marcescens NOT DETECTED NOT DETECTED Final   Haemophilus influenzae NOT DETECTED NOT DETECTED Final   Neisseria meningitidis NOT DETECTED NOT DETECTED Final   Pseudomonas aeruginosa NOT DETECTED NOT DETECTED Final   Candida albicans NOT DETECTED NOT DETECTED Final   Candida glabrata NOT DETECTED NOT DETECTED Final   Candida krusei NOT DETECTED NOT DETECTED Final   Candida parapsilosis NOT DETECTED NOT DETECTED Final   Candida tropicalis NOT DETECTED NOT DETECTED Final    Comment: Performed at [facility]  Status: None   Collection Time: 03/14/16  8:27 AM  Result Value Ref Range Status   Specimen Description SPUTUM  Final   Special Requests NONE  Final   Sputum evaluation   Final    THIS SPECIMEN IS ACCEPTABLE. RESPIRATORY CULTURE REPORT TO FOLLOW.   Report Status 03/14/2016 FINAL  Final  Culture, respiratory (NON-Expectorated)     Status: None   Collection Time: 03/14/16  8:27 AM  Result Value Ref Range Status   Specimen Description SPUTUM  Final   Special Requests NONE  Final   Gram Stain   Final    MODERATE WBC  PRESENT,BOTH PMN AND MONONUCLEAR NO ORGANISMS SEEN    Culture   Final    Consistent with normal respiratory flora. Performed at [facility]    Report Status 03/16/2016 FINAL  Final     Labs: Basic Metabolic Panel:  Recent Labs Lab 03/13/16 1426 03/14/16 0501 03/15/16 0511 03/17/16 0404  NA 135 138 140 141  K 3.3* 2.9* 3.2* 3.6  CL 101 104 105 106  CO2 23 24 24 25   GLUCOSE 224* 255* 29* 205*  BUN 55* 54* 55* 60*  CREATININE 4.32* 4.28* 4.36* 4.16*  CALCIUM 9.0 8.8* 8.9 8.7*   Liver Function Tests:  Recent Labs Lab 03/13/16 1426  AST 17  ALT 14  ALKPHOS 89  BILITOT 0.8  PROT 7.6  ALBUMIN 3.4*   No results for input(s): LIPASE, AMYLASE in the last 168 hours. No results for input(s): AMMONIA in the last 168 hours. CBC:  Recent Labs Lab 03/13/16 1426 03/14/16 0501 03/15/16 0511 03/17/16 0404  WBC 8.8 10.0 9.0 5.5  NEUTROABS 6.7  --   --   --   HGB 9.4* 8.5* 9.2* 8.3*  HCT 29.0* 26.7* 28.9* 26.3*  MCV 92.1 92.7 95.7 94.3  PLT 236 223 251 231   BNP: BNP (last 3 results)  Recent Labs  07/16/15 1735 11/29/15 2132 03/13/16 1443  BNP 322.3* 356.2* 626.8*   CBG:  Recent Labs Lab 03/16/16 0746 03/16/16 2304 03/17/16 0143 03/17/16 0748 03/17/16 1150  GLUCAP 295* 407* 281* 196* 267*     IMAGING STUDIES Dg Chest 2 View  Result Date: 03/13/2016 CLINICAL DATA:  Cough for 1 week, fever, smoking history EXAM: CHEST  2 VIEW COMPARISON:  Chest x-ray of 11/29/2015 FINDINGS: There is parenchymal opacity within the right upper lobe most consistent with pneumonia. Slight blunting of the left costophrenic angle may be chronic and could represent pleural thickening. Also there is moderate cardiomegaly present with possible mild pulmonary vascular congestion. Some fluid is noted in the fissures on the lateral view. No bony abnormality is seen. IMPRESSION: 1. Parenchymal opacity in the right upper lobe suspicious for pneumonia. 2. Moderate cardiomegaly  with evidence of pulmonary vascular congestion. Question mild CHF. Electronically Signed   By: Ivar Drape M.D.   On: 03/13/2016 15:56   Mr Brain Wo Contrast  Result Date: 03/16/2016 CLINICAL DATA:  Confusion. EXAM: MRI HEAD WITHOUT CONTRAST TECHNIQUE: Multiplanar, multiecho pulse sequences of the brain and surrounding structures were obtained without intravenous contrast. COMPARISON:  Head CT 01/20/2015 FINDINGS: Brain: There is no evidence of acute infarct, intracranial hemorrhage, mass, midline shift, or extra-axial fluid collection. Generalized cerebral atrophy is mildly advanced for age. A single small focus of T2 hyperintensity in the right corona radiata and minimal periventricular white matter T2 hyperintensity are nonspecific and not greater than expected for patient's age. Vascular: Major intracranial vascular flow voids are preserved. Skull  and upper cervical spine: Unremarkable bone marrow signal. Sinuses/Orbits: 4 x 2 mm linear focus of low T2 signal in the lateral aspect of the vitreous chamber of the left globe with suggestion of a minimal amount of low signal material posteriorly as well. Mild right ethmoid, right frontal, and right maxillary sinus mucosal thickening. Other: None. IMPRESSION: 1. No acute intracranial abnormality. 2. Mild cerebral atrophy. 3. Small amount of low signal material in the vitreous chamber of the left globe, suggest correlation with any ophthalmologic history (such as prior retinal detachment) and fundoscopic examination. Electronically Signed   By: Logan Bores M.D.   On: 03/16/2016 17:35    DISCHARGE EXAMINATION: Vitals:   03/16/16 0749 03/16/16 1517 03/16/16 2150 03/17/16 0557  BP:  (!) 141/66 114/66 139/61  Pulse:  74 76 74  Resp:  17 18 16   Temp:  97.5 F (36.4 C) 98.4 F (36.9 C) 98.4 F (36.9 C)  TempSrc:  Oral Oral Oral  SpO2: 98% 96% 92% 92%  Weight:      Height:       General appearance: alert, cooperative, distracted and no distress Resp:  Much improved air entry bilaterally. Still has a few crackles at the bases, but less than before. no Wheezing. Cardio: regular rate and rhythm, S1, S2 normal, no murmur, click, rub or gallop GI: soft, non-tender; bowel sounds normal; no masses,  no organomegaly Extremities: extremities normal, atraumatic, no cyanosis or edema  DISPOSITION: Home with home health  Discharge Instructions    Call MD for:  difficulty breathing, headache or visual disturbances    Complete by:  As directed    Call MD for:  hives    Complete by:  As directed    Call MD for:  persistant dizziness or light-headedness    Complete by:  As directed    Call MD for:  persistant nausea and vomiting    Complete by:  As directed    Call MD for:  severe uncontrolled pain    Complete by:  As directed    Call MD for:  temperature >100.4    Complete by:  As directed    Discharge instructions    Complete by:  As directed    Please be sure to follow-up with your nephrologist at the [facility] You will need to your INR level checked as we have discussed.   You were cared for by a hospitalist during your hospital stay. If you have any questions about your discharge medications or the care you received while you were in the hospital after you are discharged, you can call the unit and asked to speak with the hospitalist on call if the hospitalist that took care of you is not available. Once you are discharged, your primary care physician will handle any further medical issues. Please note that NO REFILLS for any discharge medications will be authorized once you are discharged, as it is imperative that you return to your primary care physician (or establish a relationship with a primary care physician if you do not have one) for your aftercare needs so that they can reassess your need for medications and monitor your lab values. If you do not have a primary care physician, you can call [phone] for a physician referral.   Increase activity  slowly    Complete by:  As directed       ALLERGIES:  Allergies  Allergen Reactions  . Oxycodone Other (See Comments)    Makes patient lethargic, sleeping for  few days, altered mental status     Discharge Medication List as of 03/17/2016 12:01 PM    START taking these medications   Details  azithromycin (ZITHROMAX) 500 MG tablet Take 1 tablet (500 mg total) by mouth daily., Starting Mon 03/17/2016, Until Sat 03/22/2016, Print    cefpodoxime (VANTIN) 200 MG tablet Take 1 tablet (200 mg total) by mouth daily., Starting Mon 03/17/2016, Until Fri 03/21/2016, Print    guaiFENesin (MUCINEX) 600 MG 12 hr tablet Take 1 tablet (600 mg total) by mouth 2 (two) times daily., Starting Mon 03/17/2016, Print      CONTINUE these medications which have CHANGED   Details  warfarin (COUMADIN) 5 MG tablet Take 1 tablet (5 mg total) by mouth daily. Take 5mg  every day., Starting Mon 03/17/2016, No Print      CONTINUE these medications which have NOT CHANGED   Details  albuterol (PROVENTIL HFA;VENTOLIN HFA) 108 (90 BASE) MCG/ACT inhaler Inhale 1-2 puffs into the lungs every 6 (six) hours as needed for wheezing or shortness of breath., Historical Med    amLODipine (NORVASC) 10 MG tablet Take 10 mg by mouth daily., Historical Med    aspirin EC 81 MG tablet Take 81 mg by mouth daily., Historical Med    atorvastatin (LIPITOR) 80 MG tablet Take 40 mg by mouth every evening., Historical Med    benztropine (COGENTIN) 0.5 MG tablet Take 0.5 mg by mouth 2 (two) times daily., Historical Med    bisacodyl (BISACODYL) 5 MG EC tablet Take 5 mg by mouth daily. , Historical Med    bumetanide (BUMEX) 2 MG tablet Take 4 mg by mouth 2 (two) times daily. , Historical Med    carvedilol (COREG) 25 MG tablet Take 25 mg by mouth 2 (two) times daily with a meal., Historical Med    darbepoetin (ARANESP) 40 MCG/0.4ML SOLN injection Inject 40 mcg into the skin every 7 (seven) days. , Historical Med    docusate sodium  (COLACE) 100 MG capsule Take 100 mg by mouth daily., Historical Med    ferrous sulfate 325 (65 FE) MG tablet Take 325 mg by mouth 2 (two) times daily with a meal., Historical Med    fluPHENAZine (PROLIXIN) 1 MG tablet Take 1 mg by mouth at bedtime. , Historical Med    gabapentin (NEURONTIN) 300 MG capsule Take 300 mg by mouth daily. , Historical Med    glucagon (GLUCAGON EMERGENCY) 1 MG injection Inject 1 mg into the vein once as needed (for low blood sugar)., Historical Med    hydrALAZINE (APRESOLINE) 100 MG tablet Take 100 mg by mouth 2 (two) times daily., Historical Med    insulin aspart (NOVOLOG) 100 UNIT/ML injection Inject 5-15 Units into the skin 3 (three) times daily before meals. Sliding scale  <150 = 0 units >151-250 = 5-8 units  >251-350 = 8-10 units  >350 = 15 units, Historical Med    insulin detemir (LEVEMIR) 100 UNIT/ML injection Inject 0.12 mLs (12 Units total) into the skin at bedtime., Starting Wed 11/22/2014, Print    isosorbide mononitrate (IMDUR) 60 MG 24 hr tablet Take 60 mg by mouth daily with breakfast. , Historical Med    pantoprazole (PROTONIX) 20 MG tablet Take 20 mg by mouth 2 (two) times daily. , Historical Med    QUEtiapine (SEROQUEL) 300 MG tablet Take 600 mg by mouth at bedtime., Historical Med      STOP taking these medications     cephALEXin (KEFLEX) 500 MG capsule  traMADol (ULTRAM) 50 MG tablet          Follow-up Information    [facility]. Schedule an appointment as soon as possible for a visit in 4 day(s).   Specialty:  General Practice Contact information: [place] [address] [place] .   Specialty:  [place] Why:  physical therapy and occupational therapy Contact information: [place] [address] [phone]           TOTAL DISCHARGE TIME: 1 minutes  [facility] Pager [phone]  03/17/2016,  1:11 PM

## 2016-03-17 NOTE — Progress Notes (Signed)
[place] for Warfarin Indication: hx  DVT  Allergies  Allergen Reactions  . Oxycodone Other (See Comments)    Makes patient lethargic, sleeping for few days, altered mental status   Patient Measurements: Height: 5\' 4"  (162.6 cm) Weight: 164 lb (74.4 kg) IBW/kg (Calculated) : 54.7  Vital Signs: Temp: 98.4 F (36.9 C) (10/23 0557) Temp Source: Oral (10/23 0557) BP: 139/61 (10/23 0557) Pulse Rate: 74 (10/23 0557)  Labs:  Recent Labs  03/15/16 0511 03/16/16 0513 03/17/16 0404  HGB 9.2*  --  8.3*  HCT 28.9*  --  26.3*  PLT 251  --  231  LABPROT 31.3* 28.0* 26.1*  INR 2.94 2.56 2.34  CREATININE 4.36*  --  4.16*   Estimated Creatinine Clearance: 13.7 mL/min (by C-G formula based on SCr of 4.16 mg/dL (H)).  Medications:  Scheduled:  . amLODipine  10 mg Oral Daily  . aspirin EC  81 mg Oral Daily  . atorvastatin  40 mg Oral QPM  . azithromycin  500 mg Oral Q24H  . benztropine  0.5 mg Oral BID  . bisacodyl  5 mg Oral Daily  . bumetanide  4 mg Oral BID  . carvedilol  25 mg Oral BID WC  . cefpodoxime  200 mg Oral Q24H  . ferrous sulfate  325 mg Oral BID WC  . fluPHENAZine  1 mg Oral QHS  . gabapentin  300 mg Oral Daily  . guaiFENesin  600 mg Oral BID  . hydrALAZINE  100 mg Oral BID  . insulin aspart  0-9 Units Subcutaneous TID WC  . insulin detemir  10 Units Subcutaneous QHS  . ipratropium-albuterol  3 mL Nebulization BID  . isosorbide mononitrate  60 mg Oral Q breakfast  . pantoprazole  20 mg Oral BID  . QUEtiapine  200 mg Oral QHS  . sodium chloride flush  3 mL Intravenous Q12H  . Warfarin - Pharmacist Dosing Inpatient   Does not apply q1800   Assessment: Patient is a 65 y.o with hx DVT on warfarin PTA, presented to the ED on 10/20 with c/o SOB.  Warfarin resumed on admission.  INR was 3.18 on admission.  Patient's daughter stated that she cut back on her mom "greens" consumption a couple of weeks ago because her mom's INR  was subtherapeutic at one point.  Reduction in vit K consumption may explain why she is more sensitive to her warfarin (with INR elevated on admit).  Home warfarin regimen: 7.5 mg daily except 5 mg on Tues and Thurs (verified regimen with daughter)  10/21: unclear if dose that was scheduled on 10/20 was given. Med has not been charted against  Today, 03/17/2016: - INR therapeutic at 2.34 - hgb 8.3, plt wnl  - no bleeding documented - on carb modified diet - drug-drug intxns: on abx  Goal of Therapy:  INR 2-3 Monitor platelets by anticoagulation protocol: Yes   Plan:  - warfarin 5 mg PO x1 today - monitor for s/s bleeding - PT/INR ordered with AM labs   Minda Ditto PharmD Pager [phone] 03/17/2016, 8:07 AM

## 2016-03-18 LAB — GLUCOSE, CAPILLARY
GLUCOSE-CAPILLARY: 75 mg/dL (ref 65–99)
GLUCOSE-CAPILLARY: 290 mg/dL — AB (ref 65–99)
GLUCOSE-CAPILLARY: 428 mg/dL — AB (ref 65–99)
Glucose-Capillary: 66 mg/dL (ref 65–99)

## 2016-03-18 LAB — CULTURE, BLOOD (ROUTINE X 2): Culture: NO GROWTH

## 2016-03-20 DIAGNOSIS — Z8701 Personal history of pneumonia (recurrent): Secondary | ICD-10-CM | POA: Diagnosis not present

## 2016-03-20 DIAGNOSIS — D631 Anemia in chronic kidney disease: Secondary | ICD-10-CM | POA: Diagnosis not present

## 2016-03-20 DIAGNOSIS — I5042 Chronic combined systolic (congestive) and diastolic (congestive) heart failure: Secondary | ICD-10-CM | POA: Diagnosis not present

## 2016-03-20 DIAGNOSIS — I130 Hypertensive heart and chronic kidney disease with heart failure and stage 1 through stage 4 chronic kidney disease, or unspecified chronic kidney disease: Secondary | ICD-10-CM | POA: Diagnosis not present

## 2016-03-20 DIAGNOSIS — E1165 Type 2 diabetes mellitus with hyperglycemia: Secondary | ICD-10-CM | POA: Diagnosis not present

## 2016-03-20 DIAGNOSIS — N184 Chronic kidney disease, stage 4 (severe): Secondary | ICD-10-CM | POA: Diagnosis not present

## 2016-03-26 DIAGNOSIS — Z8701 Personal history of pneumonia (recurrent): Secondary | ICD-10-CM | POA: Diagnosis not present

## 2016-03-26 DIAGNOSIS — N184 Chronic kidney disease, stage 4 (severe): Secondary | ICD-10-CM | POA: Diagnosis not present

## 2016-03-26 DIAGNOSIS — E1165 Type 2 diabetes mellitus with hyperglycemia: Secondary | ICD-10-CM | POA: Diagnosis not present

## 2016-03-26 DIAGNOSIS — I5042 Chronic combined systolic (congestive) and diastolic (congestive) heart failure: Secondary | ICD-10-CM | POA: Diagnosis not present

## 2016-03-26 DIAGNOSIS — D631 Anemia in chronic kidney disease: Secondary | ICD-10-CM | POA: Diagnosis not present

## 2016-03-26 DIAGNOSIS — I130 Hypertensive heart and chronic kidney disease with heart failure and stage 1 through stage 4 chronic kidney disease, or unspecified chronic kidney disease: Secondary | ICD-10-CM | POA: Diagnosis not present

## 2016-03-28 DIAGNOSIS — N184 Chronic kidney disease, stage 4 (severe): Secondary | ICD-10-CM | POA: Diagnosis not present

## 2016-03-28 DIAGNOSIS — D631 Anemia in chronic kidney disease: Secondary | ICD-10-CM | POA: Diagnosis not present

## 2016-03-28 DIAGNOSIS — I130 Hypertensive heart and chronic kidney disease with heart failure and stage 1 through stage 4 chronic kidney disease, or unspecified chronic kidney disease: Secondary | ICD-10-CM | POA: Diagnosis not present

## 2016-03-28 DIAGNOSIS — E1165 Type 2 diabetes mellitus with hyperglycemia: Secondary | ICD-10-CM | POA: Diagnosis not present

## 2016-03-28 DIAGNOSIS — Z8701 Personal history of pneumonia (recurrent): Secondary | ICD-10-CM | POA: Diagnosis not present

## 2016-03-28 DIAGNOSIS — I5042 Chronic combined systolic (congestive) and diastolic (congestive) heart failure: Secondary | ICD-10-CM | POA: Diagnosis not present

## 2016-03-31 DIAGNOSIS — N184 Chronic kidney disease, stage 4 (severe): Secondary | ICD-10-CM | POA: Diagnosis not present

## 2016-03-31 DIAGNOSIS — I5042 Chronic combined systolic (congestive) and diastolic (congestive) heart failure: Secondary | ICD-10-CM | POA: Diagnosis not present

## 2016-03-31 DIAGNOSIS — E1165 Type 2 diabetes mellitus with hyperglycemia: Secondary | ICD-10-CM | POA: Diagnosis not present

## 2016-03-31 DIAGNOSIS — I130 Hypertensive heart and chronic kidney disease with heart failure and stage 1 through stage 4 chronic kidney disease, or unspecified chronic kidney disease: Secondary | ICD-10-CM | POA: Diagnosis not present

## 2016-03-31 DIAGNOSIS — Z8701 Personal history of pneumonia (recurrent): Secondary | ICD-10-CM | POA: Diagnosis not present

## 2016-03-31 DIAGNOSIS — D631 Anemia in chronic kidney disease: Secondary | ICD-10-CM | POA: Diagnosis not present

## 2016-04-02 DIAGNOSIS — I130 Hypertensive heart and chronic kidney disease with heart failure and stage 1 through stage 4 chronic kidney disease, or unspecified chronic kidney disease: Secondary | ICD-10-CM | POA: Diagnosis not present

## 2016-04-02 DIAGNOSIS — E1165 Type 2 diabetes mellitus with hyperglycemia: Secondary | ICD-10-CM | POA: Diagnosis not present

## 2016-04-02 DIAGNOSIS — D631 Anemia in chronic kidney disease: Secondary | ICD-10-CM | POA: Diagnosis not present

## 2016-04-02 DIAGNOSIS — I5042 Chronic combined systolic (congestive) and diastolic (congestive) heart failure: Secondary | ICD-10-CM | POA: Diagnosis not present

## 2016-04-02 DIAGNOSIS — N184 Chronic kidney disease, stage 4 (severe): Secondary | ICD-10-CM | POA: Diagnosis not present

## 2016-04-02 DIAGNOSIS — Z8701 Personal history of pneumonia (recurrent): Secondary | ICD-10-CM | POA: Diagnosis not present

## 2016-04-03 DIAGNOSIS — N184 Chronic kidney disease, stage 4 (severe): Secondary | ICD-10-CM | POA: Diagnosis not present

## 2016-04-03 DIAGNOSIS — I5042 Chronic combined systolic (congestive) and diastolic (congestive) heart failure: Secondary | ICD-10-CM | POA: Diagnosis not present

## 2016-04-03 DIAGNOSIS — I130 Hypertensive heart and chronic kidney disease with heart failure and stage 1 through stage 4 chronic kidney disease, or unspecified chronic kidney disease: Secondary | ICD-10-CM | POA: Diagnosis not present

## 2016-04-03 DIAGNOSIS — Z8701 Personal history of pneumonia (recurrent): Secondary | ICD-10-CM | POA: Diagnosis not present

## 2016-04-03 DIAGNOSIS — D631 Anemia in chronic kidney disease: Secondary | ICD-10-CM | POA: Diagnosis not present

## 2016-04-03 DIAGNOSIS — E1165 Type 2 diabetes mellitus with hyperglycemia: Secondary | ICD-10-CM | POA: Diagnosis not present

## 2016-04-04 DIAGNOSIS — N184 Chronic kidney disease, stage 4 (severe): Secondary | ICD-10-CM | POA: Diagnosis not present

## 2016-04-04 DIAGNOSIS — Z8701 Personal history of pneumonia (recurrent): Secondary | ICD-10-CM | POA: Diagnosis not present

## 2016-04-04 DIAGNOSIS — D631 Anemia in chronic kidney disease: Secondary | ICD-10-CM | POA: Diagnosis not present

## 2016-04-04 DIAGNOSIS — I5042 Chronic combined systolic (congestive) and diastolic (congestive) heart failure: Secondary | ICD-10-CM | POA: Diagnosis not present

## 2016-04-04 DIAGNOSIS — E1165 Type 2 diabetes mellitus with hyperglycemia: Secondary | ICD-10-CM | POA: Diagnosis not present

## 2016-04-04 DIAGNOSIS — I130 Hypertensive heart and chronic kidney disease with heart failure and stage 1 through stage 4 chronic kidney disease, or unspecified chronic kidney disease: Secondary | ICD-10-CM | POA: Diagnosis not present

## 2016-04-07 DIAGNOSIS — N184 Chronic kidney disease, stage 4 (severe): Secondary | ICD-10-CM | POA: Diagnosis not present

## 2016-04-07 DIAGNOSIS — D631 Anemia in chronic kidney disease: Secondary | ICD-10-CM | POA: Diagnosis not present

## 2016-04-07 DIAGNOSIS — Z8701 Personal history of pneumonia (recurrent): Secondary | ICD-10-CM | POA: Diagnosis not present

## 2016-04-07 DIAGNOSIS — E1165 Type 2 diabetes mellitus with hyperglycemia: Secondary | ICD-10-CM | POA: Diagnosis not present

## 2016-04-07 DIAGNOSIS — I130 Hypertensive heart and chronic kidney disease with heart failure and stage 1 through stage 4 chronic kidney disease, or unspecified chronic kidney disease: Secondary | ICD-10-CM | POA: Diagnosis not present

## 2016-04-07 DIAGNOSIS — I5042 Chronic combined systolic (congestive) and diastolic (congestive) heart failure: Secondary | ICD-10-CM | POA: Diagnosis not present

## 2016-04-08 DIAGNOSIS — Z8701 Personal history of pneumonia (recurrent): Secondary | ICD-10-CM | POA: Diagnosis not present

## 2016-04-08 DIAGNOSIS — N184 Chronic kidney disease, stage 4 (severe): Secondary | ICD-10-CM | POA: Diagnosis not present

## 2016-04-08 DIAGNOSIS — I130 Hypertensive heart and chronic kidney disease with heart failure and stage 1 through stage 4 chronic kidney disease, or unspecified chronic kidney disease: Secondary | ICD-10-CM | POA: Diagnosis not present

## 2016-04-08 DIAGNOSIS — E1165 Type 2 diabetes mellitus with hyperglycemia: Secondary | ICD-10-CM | POA: Diagnosis not present

## 2016-04-08 DIAGNOSIS — D631 Anemia in chronic kidney disease: Secondary | ICD-10-CM | POA: Diagnosis not present

## 2016-04-08 DIAGNOSIS — I5042 Chronic combined systolic (congestive) and diastolic (congestive) heart failure: Secondary | ICD-10-CM | POA: Diagnosis not present

## 2016-04-09 DIAGNOSIS — D631 Anemia in chronic kidney disease: Secondary | ICD-10-CM | POA: Diagnosis not present

## 2016-04-09 DIAGNOSIS — Z8701 Personal history of pneumonia (recurrent): Secondary | ICD-10-CM | POA: Diagnosis not present

## 2016-04-09 DIAGNOSIS — E1165 Type 2 diabetes mellitus with hyperglycemia: Secondary | ICD-10-CM | POA: Diagnosis not present

## 2016-04-09 DIAGNOSIS — I130 Hypertensive heart and chronic kidney disease with heart failure and stage 1 through stage 4 chronic kidney disease, or unspecified chronic kidney disease: Secondary | ICD-10-CM | POA: Diagnosis not present

## 2016-04-09 DIAGNOSIS — I5042 Chronic combined systolic (congestive) and diastolic (congestive) heart failure: Secondary | ICD-10-CM | POA: Diagnosis not present

## 2016-04-09 DIAGNOSIS — N184 Chronic kidney disease, stage 4 (severe): Secondary | ICD-10-CM | POA: Diagnosis not present

## 2016-04-14 DIAGNOSIS — Z8701 Personal history of pneumonia (recurrent): Secondary | ICD-10-CM | POA: Diagnosis not present

## 2016-04-14 DIAGNOSIS — I130 Hypertensive heart and chronic kidney disease with heart failure and stage 1 through stage 4 chronic kidney disease, or unspecified chronic kidney disease: Secondary | ICD-10-CM | POA: Diagnosis not present

## 2016-04-14 DIAGNOSIS — N184 Chronic kidney disease, stage 4 (severe): Secondary | ICD-10-CM | POA: Diagnosis not present

## 2016-04-14 DIAGNOSIS — I5042 Chronic combined systolic (congestive) and diastolic (congestive) heart failure: Secondary | ICD-10-CM | POA: Diagnosis not present

## 2016-04-14 DIAGNOSIS — E1165 Type 2 diabetes mellitus with hyperglycemia: Secondary | ICD-10-CM | POA: Diagnosis not present

## 2016-04-14 DIAGNOSIS — D631 Anemia in chronic kidney disease: Secondary | ICD-10-CM | POA: Diagnosis not present

## 2016-04-15 DIAGNOSIS — N184 Chronic kidney disease, stage 4 (severe): Secondary | ICD-10-CM | POA: Diagnosis not present

## 2016-04-15 DIAGNOSIS — D631 Anemia in chronic kidney disease: Secondary | ICD-10-CM | POA: Diagnosis not present

## 2016-04-15 DIAGNOSIS — I5042 Chronic combined systolic (congestive) and diastolic (congestive) heart failure: Secondary | ICD-10-CM | POA: Diagnosis not present

## 2016-04-15 DIAGNOSIS — E1165 Type 2 diabetes mellitus with hyperglycemia: Secondary | ICD-10-CM | POA: Diagnosis not present

## 2016-04-15 DIAGNOSIS — I130 Hypertensive heart and chronic kidney disease with heart failure and stage 1 through stage 4 chronic kidney disease, or unspecified chronic kidney disease: Secondary | ICD-10-CM | POA: Diagnosis not present

## 2016-04-15 DIAGNOSIS — Z8701 Personal history of pneumonia (recurrent): Secondary | ICD-10-CM | POA: Diagnosis not present

## 2016-04-16 DIAGNOSIS — Z8701 Personal history of pneumonia (recurrent): Secondary | ICD-10-CM | POA: Diagnosis not present

## 2016-04-16 DIAGNOSIS — N184 Chronic kidney disease, stage 4 (severe): Secondary | ICD-10-CM | POA: Diagnosis not present

## 2016-04-16 DIAGNOSIS — I130 Hypertensive heart and chronic kidney disease with heart failure and stage 1 through stage 4 chronic kidney disease, or unspecified chronic kidney disease: Secondary | ICD-10-CM | POA: Diagnosis not present

## 2016-04-16 DIAGNOSIS — D631 Anemia in chronic kidney disease: Secondary | ICD-10-CM | POA: Diagnosis not present

## 2016-04-16 DIAGNOSIS — E1165 Type 2 diabetes mellitus with hyperglycemia: Secondary | ICD-10-CM | POA: Diagnosis not present

## 2016-04-16 DIAGNOSIS — I5042 Chronic combined systolic (congestive) and diastolic (congestive) heart failure: Secondary | ICD-10-CM | POA: Diagnosis not present

## 2016-04-22 DIAGNOSIS — I5042 Chronic combined systolic (congestive) and diastolic (congestive) heart failure: Secondary | ICD-10-CM | POA: Diagnosis not present

## 2016-04-22 DIAGNOSIS — N184 Chronic kidney disease, stage 4 (severe): Secondary | ICD-10-CM | POA: Diagnosis not present

## 2016-04-22 DIAGNOSIS — I130 Hypertensive heart and chronic kidney disease with heart failure and stage 1 through stage 4 chronic kidney disease, or unspecified chronic kidney disease: Secondary | ICD-10-CM | POA: Diagnosis not present

## 2016-04-22 DIAGNOSIS — E1165 Type 2 diabetes mellitus with hyperglycemia: Secondary | ICD-10-CM | POA: Diagnosis not present

## 2016-04-22 DIAGNOSIS — D631 Anemia in chronic kidney disease: Secondary | ICD-10-CM | POA: Diagnosis not present

## 2016-04-22 DIAGNOSIS — Z8701 Personal history of pneumonia (recurrent): Secondary | ICD-10-CM | POA: Diagnosis not present

## 2016-04-23 DIAGNOSIS — N184 Chronic kidney disease, stage 4 (severe): Secondary | ICD-10-CM | POA: Diagnosis not present

## 2016-04-23 DIAGNOSIS — I130 Hypertensive heart and chronic kidney disease with heart failure and stage 1 through stage 4 chronic kidney disease, or unspecified chronic kidney disease: Secondary | ICD-10-CM | POA: Diagnosis not present

## 2016-04-23 DIAGNOSIS — Z8701 Personal history of pneumonia (recurrent): Secondary | ICD-10-CM | POA: Diagnosis not present

## 2016-04-23 DIAGNOSIS — D631 Anemia in chronic kidney disease: Secondary | ICD-10-CM | POA: Diagnosis not present

## 2016-04-23 DIAGNOSIS — E1165 Type 2 diabetes mellitus with hyperglycemia: Secondary | ICD-10-CM | POA: Diagnosis not present

## 2016-04-23 DIAGNOSIS — I5042 Chronic combined systolic (congestive) and diastolic (congestive) heart failure: Secondary | ICD-10-CM | POA: Diagnosis not present

## 2016-04-28 DIAGNOSIS — D631 Anemia in chronic kidney disease: Secondary | ICD-10-CM | POA: Diagnosis not present

## 2016-04-28 DIAGNOSIS — I130 Hypertensive heart and chronic kidney disease with heart failure and stage 1 through stage 4 chronic kidney disease, or unspecified chronic kidney disease: Secondary | ICD-10-CM | POA: Diagnosis not present

## 2016-04-28 DIAGNOSIS — E1165 Type 2 diabetes mellitus with hyperglycemia: Secondary | ICD-10-CM | POA: Diagnosis not present

## 2016-04-28 DIAGNOSIS — N184 Chronic kidney disease, stage 4 (severe): Secondary | ICD-10-CM | POA: Diagnosis not present

## 2016-04-28 DIAGNOSIS — I5042 Chronic combined systolic (congestive) and diastolic (congestive) heart failure: Secondary | ICD-10-CM | POA: Diagnosis not present

## 2016-04-28 DIAGNOSIS — Z8701 Personal history of pneumonia (recurrent): Secondary | ICD-10-CM | POA: Diagnosis not present

## 2016-04-30 DIAGNOSIS — I5042 Chronic combined systolic (congestive) and diastolic (congestive) heart failure: Secondary | ICD-10-CM | POA: Diagnosis not present

## 2016-04-30 DIAGNOSIS — I130 Hypertensive heart and chronic kidney disease with heart failure and stage 1 through stage 4 chronic kidney disease, or unspecified chronic kidney disease: Secondary | ICD-10-CM | POA: Diagnosis not present

## 2016-04-30 DIAGNOSIS — N184 Chronic kidney disease, stage 4 (severe): Secondary | ICD-10-CM | POA: Diagnosis not present

## 2016-04-30 DIAGNOSIS — E1165 Type 2 diabetes mellitus with hyperglycemia: Secondary | ICD-10-CM | POA: Diagnosis not present

## 2016-04-30 DIAGNOSIS — Z8701 Personal history of pneumonia (recurrent): Secondary | ICD-10-CM | POA: Diagnosis not present

## 2016-04-30 DIAGNOSIS — D631 Anemia in chronic kidney disease: Secondary | ICD-10-CM | POA: Diagnosis not present

## 2016-05-06 DIAGNOSIS — E1165 Type 2 diabetes mellitus with hyperglycemia: Secondary | ICD-10-CM | POA: Diagnosis not present

## 2016-05-06 DIAGNOSIS — N184 Chronic kidney disease, stage 4 (severe): Secondary | ICD-10-CM | POA: Diagnosis not present

## 2016-05-06 DIAGNOSIS — I5042 Chronic combined systolic (congestive) and diastolic (congestive) heart failure: Secondary | ICD-10-CM | POA: Diagnosis not present

## 2016-05-06 DIAGNOSIS — Z8701 Personal history of pneumonia (recurrent): Secondary | ICD-10-CM | POA: Diagnosis not present

## 2016-05-06 DIAGNOSIS — D631 Anemia in chronic kidney disease: Secondary | ICD-10-CM | POA: Diagnosis not present

## 2016-05-06 DIAGNOSIS — I130 Hypertensive heart and chronic kidney disease with heart failure and stage 1 through stage 4 chronic kidney disease, or unspecified chronic kidney disease: Secondary | ICD-10-CM | POA: Diagnosis not present

## 2016-05-08 DIAGNOSIS — N184 Chronic kidney disease, stage 4 (severe): Secondary | ICD-10-CM | POA: Diagnosis not present

## 2016-05-08 DIAGNOSIS — I5042 Chronic combined systolic (congestive) and diastolic (congestive) heart failure: Secondary | ICD-10-CM | POA: Diagnosis not present

## 2016-05-08 DIAGNOSIS — D631 Anemia in chronic kidney disease: Secondary | ICD-10-CM | POA: Diagnosis not present

## 2016-05-08 DIAGNOSIS — I130 Hypertensive heart and chronic kidney disease with heart failure and stage 1 through stage 4 chronic kidney disease, or unspecified chronic kidney disease: Secondary | ICD-10-CM | POA: Diagnosis not present

## 2016-05-08 DIAGNOSIS — Z8701 Personal history of pneumonia (recurrent): Secondary | ICD-10-CM | POA: Diagnosis not present

## 2016-05-08 DIAGNOSIS — E1165 Type 2 diabetes mellitus with hyperglycemia: Secondary | ICD-10-CM | POA: Diagnosis not present

## 2016-05-14 DIAGNOSIS — D631 Anemia in chronic kidney disease: Secondary | ICD-10-CM | POA: Diagnosis not present

## 2016-05-14 DIAGNOSIS — E1165 Type 2 diabetes mellitus with hyperglycemia: Secondary | ICD-10-CM | POA: Diagnosis not present

## 2016-05-14 DIAGNOSIS — I130 Hypertensive heart and chronic kidney disease with heart failure and stage 1 through stage 4 chronic kidney disease, or unspecified chronic kidney disease: Secondary | ICD-10-CM | POA: Diagnosis not present

## 2016-05-14 DIAGNOSIS — Z8701 Personal history of pneumonia (recurrent): Secondary | ICD-10-CM | POA: Diagnosis not present

## 2016-05-14 DIAGNOSIS — I5042 Chronic combined systolic (congestive) and diastolic (congestive) heart failure: Secondary | ICD-10-CM | POA: Diagnosis not present

## 2016-05-14 DIAGNOSIS — N184 Chronic kidney disease, stage 4 (severe): Secondary | ICD-10-CM | POA: Diagnosis not present

## 2016-06-24 ENCOUNTER — Ambulatory Visit (INDEPENDENT_AMBULATORY_CARE_PROVIDER_SITE_OTHER): Payer: Medicare Other | Admitting: Family

## 2016-07-10 ENCOUNTER — Ambulatory Visit (INDEPENDENT_AMBULATORY_CARE_PROVIDER_SITE_OTHER): Payer: Medicare Other | Admitting: Orthopedic Surgery

## 2016-07-16 ENCOUNTER — Ambulatory Visit (INDEPENDENT_AMBULATORY_CARE_PROVIDER_SITE_OTHER): Payer: Medicare Other

## 2016-07-16 ENCOUNTER — Ambulatory Visit (INDEPENDENT_AMBULATORY_CARE_PROVIDER_SITE_OTHER): Payer: Medicare Other | Admitting: Orthopedic Surgery

## 2016-07-16 ENCOUNTER — Ambulatory Visit (INDEPENDENT_AMBULATORY_CARE_PROVIDER_SITE_OTHER): Payer: Self-pay

## 2016-07-16 DIAGNOSIS — M79671 Pain in right foot: Secondary | ICD-10-CM

## 2016-07-16 DIAGNOSIS — M25572 Pain in left ankle and joints of left foot: Secondary | ICD-10-CM

## 2016-07-16 DIAGNOSIS — E11610 Type 2 diabetes mellitus with diabetic neuropathic arthropathy: Secondary | ICD-10-CM

## 2016-07-16 DIAGNOSIS — M25571 Pain in right ankle and joints of right foot: Secondary | ICD-10-CM | POA: Diagnosis not present

## 2016-07-16 NOTE — Progress Notes (Signed)
Office Visit Note   Patient: Robin [name]           Date of Birth: 1951/01/14           MRN: [id number] Visit Date: 07/16/2016              Requested by: Joni Reining [address]  [place], [place] [address] PCP: [facility]  Chief Complaint  Patient presents with  . Right Foot - Pain    Bilateral foot pain.  . Left Foot - Pain    HPI: Patient is 66 y.o woman who presents today for bilateral foot pain, ongoing x 3 months. Her right being worse than her left. Pain is weightbearing. Pain medial aspect of foot and ankle. She complains of swelling bilaterally. She is status post tibiocalcaneal fusion on the left 03/22/2015. She history of charcot collapse.Maxcine Ham, RT    Assessment & Plan: Visit Diagnoses:  1. Pain in left ankle and joints of left foot   2. Pain in right foot   3. Diabetic Charct's arthropathy ([place])     Plan: Patient is having pain beneath the plantar fascial bilaterally. Recommended over-the-counter orthotics such as sole. Recommended new balance walking sneakers to provide her support. Due to her pain across the Charcot collapse of the right foot with impending ulceration dorsally from the bone spur and impending ulceration from the Charcot rocker-bottom deformity discussed the patient could proceed with a fusion across the Lisfranc complex. Patient states she would like to proceed with surgery risks and benefits were discussed including infection neurovascular injury pain DVT need for additional surgery. Patient states she understands wish to proceed at this time.  Follow-Up Instructions: Return in about 2 weeks (around 07/30/2016).   Ortho Exam On examination patient is alert oriented no adenopathy well-dressed normal affect normal respiratory effort she has antalgic gait she is wearing slippers. Examination she has a palpable dorsalis pedis and posterior tibial pulse bilaterally. She has prominence of the old ankle hardware from the  medial malleolus of the left ankle but this is asymptomatic. She is tender to palpation along the plantar fascial left foot. There is no redness no cellulitis no signs of infection. Examination her right foot she has good pain-free range of motion of the ankle. She has a bony spur at the base of the second metatarsal which is palpable with impending skin breakdown rocker-bottom deformity due to the Lisfranc Charcot arthropathy. There are no open ulcers no signs of infection.  Imaging: Xr Foot Complete Left  Result Date: 07/16/2016 Two-view radiographs of the left foot shows no active Charcot arthropathy. Her foot is congruent no evidence of stress fractures.  Xr Ankle 2 Views Left  Result Date: 07/16/2016 2 view radiographs left ankle shows stable tibial calcaneal fusion. She does have some prominence radiographically of her old internal fixation from her medial malleolar fracture.  Xr Foot Complete Right  Result Date: 07/16/2016 Three-view radiographs of the right foot shows Charcot collapse through the Lisfranc joint. There is arthritic changes from the base of the first metatarsal medial cuneiform and displacement of the second metatarsal to the middle cuneiform. There is a large bony spur dorsally over the base of the second metatarsal.   Orders:  Orders Placed This Encounter  Procedures  . XR Foot Complete Right  . XR Ankle 2 Views Left  . XR Foot Complete Left   No orders of the defined types were placed in this encounter.  Procedures: No procedures performed  Clinical Data: No additional findings.  Subjective: Review of Systems  Objective: Vital Signs: There were no vitals taken for this visit.  Specialty Comments:  No specialty comments available.  PMFS History: Patient Active Problem List   Diagnosis Date Noted  . CAP (community acquired pneumonia) 03/13/2016  . Right leg DVT ([place]) 12/05/2015  . Acute respiratory failure with hypoxemia ([place])   .  Hyperlipidemia 03/26/2015  . Charcot ankle 03/16/2015  . Confusion 01/21/2015  . Anemia 01/21/2015  . Multiple falls 01/21/2015  . Acute on chronic diastolic heart failure ([place]) 01/21/2015  . Anemia, chronic renal failure   . Ventricular tachycardia ([place])   . Chronic renal disease, stage 4, severely decreased glomerular filtration rate between 15-29 mL/min/1.73 square meter (HCC)   . Anemia in chronic kidney disease 12/09/2014  . CKD (chronic kidney disease) 12/09/2014  . Hypertensive heart/renal disease with failure ([place]) 12/09/2014  . Bipolar affective disorder ([place]) 12/09/2014  . Acute encephalopathy   . Acute delirium 11/18/2014  . Type 2 diabetes mellitus with hyperglycemia ([place]) 11/18/2014  . Essential hypertension 11/18/2014  . Chronic combined systolic and diastolic CHF (congestive heart failure) ([place]) 11/18/2014  . Fracture dislocation of ankle 11/17/2014  . DM type 2, uncontrolled, with renal complications ([place]) 70/96/2836  . Hypokalemia 11/17/2014  . Essential hypertension, benign 11/17/2014  . Obesity 11/17/2014  . CAD (coronary artery disease), native coronary artery with 2 stents  11/17/2014   Past Medical History:  Diagnosis Date  . Anemia   . Anxiety   . Arthritis   . Benign hypertension   . Bipolar disorder ([place])   . CHF (congestive heart failure) ([place])   . Chronic kidney disease, stage V [facility])    Nephrologist is with [facility]- (Dr. Tammi Klippel)  . Coronary artery disease   . Depression   . Diabetes mellitus without complication ([place])   . GERD (gastroesophageal reflux disease)   . Heart murmur   . History of blood transfusion   . History of bronchitis   . History of pneumonia   . Shortness of breath dyspnea   . Sleep apnea   . Type 2 diabetes mellitus (HCC)     No family history on file.  Past Surgical History:  Procedure Laterality Date  . ABDOMINAL HYSTERECTOMY    . ANKLE CLOSED REDUCTION N/A 11/17/2014   Procedure: CLOSED REDUCTION ANKLE;  Surgeon:  Earlie Server, MD;  Location: [facility];  Service: Orthopedics;  Laterality: N/A;  . ANKLE FUSION Left 03/16/2015   Procedure: Left Tibiocalcaneal Fusion;  Surgeon: Newt Minion, MD;  Location: [facility];  Service: Orthopedics;  Laterality: Left;  . AV FISTULA PLACEMENT Left [id number]   done at [place]     2 stent   . CHOLECYSTECTOMY    . CORONARY STENT PLACEMENT    . HARDWARE REMOVAL Left 03/16/2015   Procedure: Removal Hardware Left Ankle;  Surgeon: Newt Minion, MD;  Location: [facility];  Service: Orthopedics;  Laterality: Left;  . ORIF ANKLE FRACTURE Left 11/20/2014   Procedure: OPEN REDUCTION INTERNAL FIXATION (ORIF) ANKLE FRACTURE;  Surgeon: Renette Butters, MD;  Location: [facility];  Service: Orthopedics;  Laterality: Left;  . TONSILLECTOMY     Social History   Occupational History  . Not on file.   Social History Main Topics  . Smoking status: Current Every Day Smoker    Packs/day: 0.50    Types: Cigarettes  . Smokeless tobacco: Never Used  .  Alcohol use No  . Drug use: No  . Sexual activity: No

## 2016-07-28 ENCOUNTER — Other Ambulatory Visit (INDEPENDENT_AMBULATORY_CARE_PROVIDER_SITE_OTHER): Payer: Self-pay | Admitting: Family

## 2016-07-30 ENCOUNTER — Encounter (HOSPITAL_COMMUNITY)
Admission: RE | Admit: 2016-07-30 | Discharge: 2016-07-30 | Disposition: A | Discharge status: Home / Self Care | Payer: Medicare Other | Source: Ambulatory Visit | Attending: Orthopedic Surgery | Admitting: Orthopedic Surgery

## 2016-07-30 ENCOUNTER — Encounter (HOSPITAL_COMMUNITY): Payer: Self-pay

## 2016-07-30 DIAGNOSIS — Z01812 Encounter for preprocedural laboratory examination: Secondary | ICD-10-CM | POA: Insufficient documentation

## 2016-07-30 DIAGNOSIS — F319 Bipolar disorder, unspecified: Secondary | ICD-10-CM | POA: Insufficient documentation

## 2016-07-30 DIAGNOSIS — I5030 Unspecified diastolic (congestive) heart failure: Secondary | ICD-10-CM | POA: Insufficient documentation

## 2016-07-30 DIAGNOSIS — N185 Chronic kidney disease, stage 5: Secondary | ICD-10-CM | POA: Insufficient documentation

## 2016-07-30 DIAGNOSIS — Z794 Long term (current) use of insulin: Secondary | ICD-10-CM | POA: Diagnosis not present

## 2016-07-30 DIAGNOSIS — K219 Gastro-esophageal reflux disease without esophagitis: Secondary | ICD-10-CM | POA: Diagnosis not present

## 2016-07-30 DIAGNOSIS — G4733 Obstructive sleep apnea (adult) (pediatric): Secondary | ICD-10-CM | POA: Insufficient documentation

## 2016-07-30 DIAGNOSIS — Z955 Presence of coronary angioplasty implant and graft: Secondary | ICD-10-CM | POA: Diagnosis not present

## 2016-07-30 DIAGNOSIS — Z7901 Long term (current) use of anticoagulants: Secondary | ICD-10-CM | POA: Insufficient documentation

## 2016-07-30 DIAGNOSIS — I132 Hypertensive heart and chronic kidney disease with heart failure and with stage 5 chronic kidney disease, or end stage renal disease: Secondary | ICD-10-CM | POA: Insufficient documentation

## 2016-07-30 DIAGNOSIS — E1122 Type 2 diabetes mellitus with diabetic chronic kidney disease: Secondary | ICD-10-CM | POA: Diagnosis not present

## 2016-07-30 DIAGNOSIS — Z7982 Long term (current) use of aspirin: Secondary | ICD-10-CM | POA: Diagnosis not present

## 2016-07-30 DIAGNOSIS — E11610 Type 2 diabetes mellitus with diabetic neuropathic arthropathy: Secondary | ICD-10-CM | POA: Insufficient documentation

## 2016-07-30 DIAGNOSIS — Z86718 Personal history of other venous thrombosis and embolism: Secondary | ICD-10-CM | POA: Diagnosis not present

## 2016-07-30 DIAGNOSIS — Z79899 Other long term (current) drug therapy: Secondary | ICD-10-CM | POA: Insufficient documentation

## 2016-07-30 DIAGNOSIS — I2510 Atherosclerotic heart disease of native coronary artery without angina pectoris: Secondary | ICD-10-CM | POA: Diagnosis not present

## 2016-07-30 HISTORY — DX: Acute embolism and thrombosis of unspecified deep veins of unspecified lower extremity: I82.409

## 2016-07-30 LAB — BASIC METABOLIC PANEL
ANION GAP: 11 (ref 5–15)
BUN: 63 mg/dL — ABNORMAL HIGH (ref 6–20)
CHLORIDE: 105 mmol/L (ref 101–111)
CO2: 22 mmol/L (ref 22–32)
Calcium: 9.1 mg/dL (ref 8.9–10.3)
Creatinine, Ser: 4.75 mg/dL — ABNORMAL HIGH (ref 0.44–1.00)
GFR calc non Af Amer: 9 mL/min — ABNORMAL LOW (ref >60)
GFR, EST AFRICAN AMERICAN: 10 mL/min — AB (ref >60)
Glucose, Bld: 143 mg/dL — ABNORMAL HIGH (ref 65–99)
Potassium: 3 mmol/L — ABNORMAL LOW (ref 3.5–5.1)
Sodium: 138 mmol/L (ref 135–145)

## 2016-07-30 LAB — CBC
HEMATOCRIT: 31.8 % — AB (ref 36.0–46.0)
HEMOGLOBIN: 10.3 g/dL — AB (ref 12.0–15.0)
MCH: 29.9 pg (ref 26.0–34.0)
MCHC: 32.4 g/dL (ref 30.0–36.0)
MCV: 92.4 fL (ref 78.0–100.0)
Platelets: 242 10*3/uL (ref 150–400)
RBC: 3.44 MIL/uL — AB (ref 3.87–5.11)
RDW: 15.2 % (ref 11.5–15.5)
WBC: 6.1 10*3/uL (ref 4.0–10.5)

## 2016-07-30 LAB — PROTIME-INR
INR: 1.06
Prothrombin Time: 13.8 seconds (ref 11.4–15.2)

## 2016-07-30 LAB — GLUCOSE, CAPILLARY: Glucose-Capillary: 162 mg/dL — ABNORMAL HIGH (ref 65–99)

## 2016-07-30 NOTE — Pre-Procedure Instructions (Signed)
Robin HALIBURTON  07/30/2016      [id number] [address], [place] [address] Phone: [phone] Fax: [phone]    Your procedure is scheduled on March 14  Report to [place] at [place].  Call this number if you have problems the morning of surgery:  [phone]   Remember:  Do not eat food or drink liquids after midnight.   Take these medicines the morning of surgery with A SIP OF WATER albuterol (PROVENTIL HFA;VENTOLIN HFA) 108 (90 BASE), benztropine (COGENTIN), carvedilol (COREG), fluPHENAZine (PROLIXIN), gabapentin (NEURONTIN), hydrALAZINE (APRESOLINE), isosorbide mononitrate (IMDUR), pantoprazole (PROTONIX), QUEtiapine (SEROQUEL),   7 days prior to surgery STOP taking any Aspirin, Aleve, Naproxen, Ibuprofen, Motrin, Advil, Goody's, BC's, all herbal medications, fish oil, and all vitamins   WHAT DO I DO ABOUT MY DIABETES MEDICATION?   [place] Kitchen Do not take oral diabetes medicines (pills) the morning of surgery.  . THE NIGHT BEFORE SURGERY, take ____10_______ units of __insulin detemir (LEVEMIR)_________insulin.       [place] Kitchen HE MORNING OF SURGERY, take _____20________ units of __insulin detemir (LEVEMIR)________insulin.  Day before surgery take usual dose of insulin aspart (NOVOLOG) but do not take evening dose   . If your CBG is greater than 220 mg/dL, you may take  of your sliding scale (correction) dose of insulin.   How to Manage Your Diabetes Before and After Surgery  Why is it important to control my blood sugar before and after surgery? . Improving blood sugar levels before and after surgery helps healing and can limit problems. . A way of improving blood sugar control is eating a healthy diet by: o  Eating less sugar and carbohydrates o  Increasing activity/exercise o  Talking with your doctor about reaching your blood sugar goals . High blood sugars (greater than 180 mg/dL) can  raise your risk of infections and slow your recovery, so you will need to focus on controlling your diabetes during the weeks before surgery. . Make sure that the doctor who takes care of your diabetes knows about your planned surgery including the date and location.  How do I manage my blood sugar before surgery? . Check your blood sugar at least 4 times a day, starting 2 days before surgery, to make sure that the level is not too high or low. o Check your blood sugar the morning of your surgery when you wake up and every 2 hours until you get to the Short Stay unit. . If your blood sugar is less than 70 mg/dL, you will need to treat for low blood sugar: o Do not take insulin. o Treat a low blood sugar (less than 70 mg/dL) with  cup of clear juice (cranberry or apple), 4 glucose tablets, OR glucose gel. o Recheck blood sugar in 15 minutes after treatment (to make sure it is greater than 70 mg/dL). If your blood sugar is not greater than 70 mg/dL on recheck, call [phone] for further instructions. . Report your blood sugar to the short stay nurse when you get to Short Stay.  . If you are admitted to the hospital after surgery: o Your blood sugar will be checked by the staff and you will probably be given insulin after surgery (instead of oral diabetes medicines) to make sure you have good blood sugar levels. o The goal for blood sugar control after surgery is 80-180 mg/dL.    Do not wear jewelry,  make-up or nail polish.  Do not wear lotions, powders, or perfumes, or deoderant.  Do not shave 48 hours prior to surgery.  Men may shave face and neck.  Do not bring valuables to the hospital.  [name] is not responsible for any belongings or valuables.  Contacts, dentures or bridgework may not be worn into surgery.  Leave your suitcase in the car.  After surgery it may be brought to your room.  For patients admitted to the hospital, discharge time will be determined by your treatment  team.  Patients discharged the day of surgery will not be allowed to drive home.    Special instructions:   [facility]- Preparing For Surgery  Before surgery, you can play an important role. Because skin is not sterile, your skin needs to be as free of germs as possible. You can reduce the number of germs on your skin by washing with CHG (chlorahexidine gluconate) Soap before surgery.  CHG is an antiseptic cleaner which kills germs and bonds with the skin to continue killing germs even after washing.  Please do not use if you have an allergy to CHG or antibacterial soaps. If your skin becomes reddened/irritated stop using the CHG.  Do not shave (including legs and underarms) for at least 48 hours prior to first CHG shower. It is OK to shave your face.  Please follow these instructions carefully.   1. Shower the NIGHT BEFORE SURGERY and the MORNING OF SURGERY with CHG.   2. If you chose to wash your hair, wash your hair first as usual with your normal shampoo.  3. After you shampoo, rinse your hair and body thoroughly to remove the shampoo.  4. Use CHG as you would any other liquid soap. You can apply CHG directly to the skin and wash gently with a scrungie or a clean washcloth.   5. Apply the CHG Soap to your body ONLY FROM THE NECK DOWN.  Do not use on open wounds or open sores. Avoid contact with your eyes, ears, mouth and genitals (private parts). Wash genitals (private parts) with your normal soap.  6. Wash thoroughly, paying special attention to the area where your surgery will be performed.  7. Thoroughly rinse your body with warm water from the neck down.  8. DO NOT shower/wash with your normal soap after using and rinsing off the CHG Soap.  9. Pat yourself dry with a CLEAN TOWEL.   10. Wear CLEAN PAJAMAS   11. Place CLEAN SHEETS on your bed the night of your first shower and DO NOT SLEEP WITH PETS.    Day of Surgery: Do not apply any deodorants/lotions. Please wear  clean clothes to the hospital/surgery center.      Please read over the following fact sheets that you were given.

## 2016-07-30 NOTE — Progress Notes (Addendum)
PCP - [facility] Medical Dr Michel Santee Cardiologist - [facility]  Chest x-ray - not needed EKG - 03/13/16 Stress Test - 2 years ago ECHO - 11/30/15 Cardiac Cath - 3-4 years ago stated it was at [facility] but not in epic  Sleep Study - with [facility] 3-4 years CPAP - does not wear  Fasting Blood Sugar - 160s Checks Blood Sugar ___2__ times a day  Sending to anesthesia for review requesting records from [facility] dose of coumadin Monday March 5  Attempted to reach patient about dialysis and when she saw her cardiologist but had to leave message for patient to call back  Patient denies shortness of breath, fever, cough and chest pain at PAT appointment   Patient verbalized understanding of instructions that was given to them at the PAT appointment. Patient expressed that there were no further questions.  Patient was also instructed that they will need to review over the PAT instructions again at home before the surgery.

## 2016-07-31 LAB — HEMOGLOBIN A1C
Hgb A1c MFr Bld: 8.3 % — ABNORMAL HIGH (ref 4.8–5.6)
Mean Plasma Glucose: 192 mg/dL

## 2016-07-31 NOTE — Progress Notes (Addendum)
  Anesthesia Chart Review:   Patient is a 66 year old female scheduled for R foot fusion Lisfranc joint on 08/06/2016 with Meridee Score, M.D.  [facility] 12/25/15.  - Last [facility] visit was at [facility] 02/14/15 - Nephrology care at [facility], last office visit 06/27/16 with Brigid Re, NP.    History includes smoking, CAD with PTCA Ramus '04 and BMS Ramus '07, HTN, diastolic CHF, OSA, heart murmur, DM, anemia, CKD stage V (left AVF done 09/2014 at [name] but not yet on HD), GERD, bipolar disorder, DVT (11/2015). S/p L tibiocalcaneal fusion 03/16/15. S/p ORIF L ankle fracture 11/20/14. S/p closed ankle reduction 11/17/14.  BMI 29.   Meds include albuterol, amlodipine, ASA 81mg , Lipitor, Cogentin, Bumex, carvedilol, Aranesp, iron, Prolixin, Neurontin, hydralazine, Novolog, Levemir, Imdur, Protonix, Seroquel, coumadin. Last dose of coumadin 07/28/16   Preoperative labs reviewed.   - HbA1c 8.3, glucose 143 - Cr 4.75, BUN 63, consistent with prior labs from [place] dating back to 2016 (correspondence dated 03/21/15 in media tab). Most recent Cr was 4.5 at nephrology visit at [facility] 06/27/16.   CXR 03/13/16:  1. Parenchymal opacity in the right upper lobe suspicious for pneumonia. 2. Moderate cardiomegaly with evidence of pulmonary vascular congestion. Question mild CHF.  EKG 03/13/16: Sinus rhythm. Probable LVH with secondary repol abnrm. Borderline prolonged QT interval  Echo 11/30/15:  - Left ventricle: The cavity size was mildly dilated. There was mild concentric hypertrophy. Systolic function was normal. The estimated ejection fraction was in the range of 55% to 60%. Wall motion was normal; there were no regional wall motion abnormalities. Features are consistent with a pseudonormal left ventricular filling pattern, with concomitant abnormal relaxation and increased filling pressure (grade 2 diastolic dysfunction). Doppler parameters are consistent with high ventricular  filling pressure. - Aortic valve: Trileaflet; normal thickness, mildly calcified leaflets. - Pulmonary arteries: PA peak pressure: 49 mm Hg (S). - Pericardium, extracardiac: A trivial, free-flowing pericardial effusion was identified circumferential to the heart. The fluid had no internal echoes.  04/2014 Stress test (as outlined in 02/14/15 [facility] notes): "Abnormal myocardial perfusion scintigraphy compatible with mild stress-induced myocardial ischemia in the posterior descending and possibly obtuse marginal coronary artery distribution. Left ventricular hypertrophy with mild systolic dysfunction. Very poor exercise capacity without reproduction of symptoms."   Cardiac cath 11/29/2009: -LM, CX both normal -[place] intermediate: ostium of the vessel appeared to have a stent that was widely patent -LAD: proximal 30%, distal tubular area 40%, ostium of first diagonal with 50% narrowing.  -RCA: proximal tubular stenosis of 40-50%. Distal RCA and PDA free of disease.   Reviewed case with Dr. Therisa Doyne. Pt denied CV symptoms at PAT.  If no changes, I anticipate pt can proceed with surgery as scheduled.   Willeen Cass, FNP-BC [facility]/Anesthesiology Phone: [phone] 08/01/2016 4:02 PM

## 2016-08-05 MED ORDER — CEFAZOLIN SODIUM-DEXTROSE 2-4 GM/100ML-% IV SOLN
2.0000 g | INTRAVENOUS | Status: AC
  Administered 2016-08-06: 2 g via INTRAVENOUS
  Filled 2016-08-05: qty 100

## 2016-08-05 NOTE — Anesthesia Preprocedure Evaluation (Addendum)
Anesthesia Evaluation  Patient identified by MRN, date of birth, ID band Patient awake    Reviewed: Allergy & Precautions, NPO status , Patient's Chart, lab work & pertinent test results  History of Anesthesia Complications Negative for: history of anesthetic complications  Airway Mallampati: III  TM Distance: >3 FB Neck ROM: Full    Dental  (+) Edentulous Upper, Dental Advisory Given   Pulmonary shortness of breath, sleep apnea , Current Smoker,    Pulmonary exam normal        Cardiovascular hypertension, Pt. on medications and Pt. on home beta blockers + CAD, + Cardiac Stents and +CHF  Normal cardiovascular exam  Study Conclusions  - Left ventricle: The cavity size was mildly dilated. There was   mild concentric hypertrophy. Systolic function was normal. The   estimated ejection fraction was in the range of 55% to 60%. Wall   motion was normal; there were no regional wall motion   abnormalities.    Neuro/Psych PSYCHIATRIC DISORDERS Anxiety Depression Bipolar Disorder negative neurological ROS     GI/Hepatic Neg liver ROS, GERD  Medicated and Controlled,  Endo/Other  diabetes, Type 2, Insulin Dependent  Renal/GU CRFRenal disease     Musculoskeletal  (+) Arthritis ,   Abdominal   Peds  Hematology  (+) anemia ,   Anesthesia Other Findings   Reproductive/Obstetrics                            Anesthesia Physical  Anesthesia Plan  ASA: III  Anesthesia Plan: MAC and Regional   Post-op Pain Management:    Induction: Intravenous  Airway Management Planned:   Additional Equipment: None  Intra-op Plan:   Post-operative Plan:   Informed Consent: I have reviewed the patients History and Physical, chart, labs and discussed the procedure including the risks, benefits and alternatives for the proposed anesthesia with the patient or authorized representative who has indicated his/her  understanding and acceptance.   Dental advisory given  Plan Discussed with: Anesthesiologist, Surgeon and CRNA  Anesthesia Plan Comments:        Anesthesia Quick Evaluation

## 2016-08-06 ENCOUNTER — Encounter (HOSPITAL_COMMUNITY)
Admission: RE | Disposition: A | Discharge status: Home / Self Care | Payer: Self-pay | Source: Ambulatory Visit | Attending: Orthopedic Surgery

## 2016-08-06 ENCOUNTER — Ambulatory Visit (HOSPITAL_BASED_OUTPATIENT_CLINIC_OR_DEPARTMENT_OTHER)
Admission: RE | Admit: 2016-08-06 | Discharge: 2016-08-06 | Disposition: A | Discharge status: Home / Self Care | Payer: Non-veteran care | Source: Ambulatory Visit | Attending: Orthopedic Surgery | Admitting: Orthopedic Surgery

## 2016-08-06 ENCOUNTER — Ambulatory Visit (HOSPITAL_COMMUNITY): Payer: Non-veteran care | Admitting: Vascular Surgery

## 2016-08-06 ENCOUNTER — Encounter (HOSPITAL_COMMUNITY): Payer: Self-pay | Admitting: Urology

## 2016-08-06 ENCOUNTER — Ambulatory Visit (HOSPITAL_COMMUNITY): Payer: Non-veteran care | Admitting: Certified Registered Nurse Anesthetist

## 2016-08-06 DIAGNOSIS — I2510 Atherosclerotic heart disease of native coronary artery without angina pectoris: Secondary | ICD-10-CM | POA: Insufficient documentation

## 2016-08-06 DIAGNOSIS — E1165 Type 2 diabetes mellitus with hyperglycemia: Secondary | ICD-10-CM | POA: Diagnosis present

## 2016-08-06 DIAGNOSIS — K219 Gastro-esophageal reflux disease without esophagitis: Secondary | ICD-10-CM | POA: Insufficient documentation

## 2016-08-06 DIAGNOSIS — G4730 Sleep apnea, unspecified: Secondary | ICD-10-CM | POA: Insufficient documentation

## 2016-08-06 DIAGNOSIS — I509 Heart failure, unspecified: Secondary | ICD-10-CM

## 2016-08-06 DIAGNOSIS — M14671 Charcot's joint, right ankle and foot: Secondary | ICD-10-CM | POA: Diagnosis not present

## 2016-08-06 DIAGNOSIS — Z79899 Other long term (current) drug therapy: Secondary | ICD-10-CM

## 2016-08-06 DIAGNOSIS — E1122 Type 2 diabetes mellitus with diabetic chronic kidney disease: Secondary | ICD-10-CM | POA: Diagnosis present

## 2016-08-06 DIAGNOSIS — Z7982 Long term (current) use of aspirin: Secondary | ICD-10-CM

## 2016-08-06 DIAGNOSIS — N185 Chronic kidney disease, stage 5: Secondary | ICD-10-CM | POA: Insufficient documentation

## 2016-08-06 DIAGNOSIS — F419 Anxiety disorder, unspecified: Secondary | ICD-10-CM

## 2016-08-06 DIAGNOSIS — Z794 Long term (current) use of insulin: Secondary | ICD-10-CM

## 2016-08-06 DIAGNOSIS — J9601 Acute respiratory failure with hypoxia: Secondary | ICD-10-CM | POA: Diagnosis present

## 2016-08-06 DIAGNOSIS — F17210 Nicotine dependence, cigarettes, uncomplicated: Secondary | ICD-10-CM

## 2016-08-06 DIAGNOSIS — Z955 Presence of coronary angioplasty implant and graft: Secondary | ICD-10-CM

## 2016-08-06 DIAGNOSIS — Z7951 Long term (current) use of inhaled steroids: Secondary | ICD-10-CM | POA: Insufficient documentation

## 2016-08-06 DIAGNOSIS — F319 Bipolar disorder, unspecified: Secondary | ICD-10-CM | POA: Insufficient documentation

## 2016-08-06 DIAGNOSIS — I82501 Chronic embolism and thrombosis of unspecified deep veins of right lower extremity: Secondary | ICD-10-CM | POA: Diagnosis present

## 2016-08-06 DIAGNOSIS — I132 Hypertensive heart and chronic kidney disease with heart failure and with stage 5 chronic kidney disease, or end stage renal disease: Secondary | ICD-10-CM

## 2016-08-06 DIAGNOSIS — Z7901 Long term (current) use of anticoagulants: Secondary | ICD-10-CM

## 2016-08-06 DIAGNOSIS — M1990 Unspecified osteoarthritis, unspecified site: Secondary | ICD-10-CM | POA: Insufficient documentation

## 2016-08-06 DIAGNOSIS — I10 Essential (primary) hypertension: Secondary | ICD-10-CM | POA: Diagnosis not present

## 2016-08-06 DIAGNOSIS — J440 Chronic obstructive pulmonary disease with acute lower respiratory infection: Secondary | ICD-10-CM | POA: Diagnosis present

## 2016-08-06 DIAGNOSIS — E876 Hypokalemia: Secondary | ICD-10-CM | POA: Diagnosis present

## 2016-08-06 DIAGNOSIS — Z86718 Personal history of other venous thrombosis and embolism: Secondary | ICD-10-CM

## 2016-08-06 DIAGNOSIS — I5043 Acute on chronic combined systolic (congestive) and diastolic (congestive) heart failure: Secondary | ICD-10-CM | POA: Diagnosis present

## 2016-08-06 DIAGNOSIS — R0602 Shortness of breath: Secondary | ICD-10-CM | POA: Diagnosis not present

## 2016-08-06 DIAGNOSIS — E11610 Type 2 diabetes mellitus with diabetic neuropathic arthropathy: Secondary | ICD-10-CM | POA: Diagnosis not present

## 2016-08-06 DIAGNOSIS — D631 Anemia in chronic kidney disease: Secondary | ICD-10-CM | POA: Diagnosis present

## 2016-08-06 DIAGNOSIS — D509 Iron deficiency anemia, unspecified: Secondary | ICD-10-CM | POA: Diagnosis not present

## 2016-08-06 DIAGNOSIS — Z8701 Personal history of pneumonia (recurrent): Secondary | ICD-10-CM

## 2016-08-06 DIAGNOSIS — J189 Pneumonia, unspecified organism: Principal | ICD-10-CM | POA: Diagnosis present

## 2016-08-06 DIAGNOSIS — R791 Abnormal coagulation profile: Secondary | ICD-10-CM | POA: Diagnosis present

## 2016-08-06 DIAGNOSIS — E8770 Fluid overload, unspecified: Secondary | ICD-10-CM | POA: Diagnosis present

## 2016-08-06 HISTORY — PX: FOOT ARTHRODESIS: SHX1655

## 2016-08-06 HISTORY — PX: APPLICATION OF WOUND VAC: SHX5189

## 2016-08-06 LAB — POCT I-STAT 4, (NA,K, GLUC, HGB,HCT)
GLUCOSE: 43 mg/dL — AB (ref 65–99)
HCT: 29 % — ABNORMAL LOW (ref 36.0–46.0)
Hemoglobin: 9.9 g/dL — ABNORMAL LOW (ref 12.0–15.0)
POTASSIUM: 3.8 mmol/L (ref 3.5–5.1)
SODIUM: 139 mmol/L (ref 135–145)

## 2016-08-06 LAB — GLUCOSE, CAPILLARY
GLUCOSE-CAPILLARY: 112 mg/dL — AB (ref 65–99)
Glucose-Capillary: 41 mg/dL — CL (ref 65–99)
Glucose-Capillary: 74 mg/dL (ref 65–99)

## 2016-08-06 SURGERY — FUSION, JOINT, FOOT
Anesthesia: Monitor Anesthesia Care | Site: Foot | Laterality: Right

## 2016-08-06 MED ORDER — CHLORHEXIDINE GLUCONATE 4 % EX LIQD: 60.0000 mL | Freq: Once | CUTANEOUS | Status: DC

## 2016-08-06 MED ORDER — DEXTROSE 50 % IV SOLN
1.0000 | Freq: Once | INTRAVENOUS | Status: DC
  Filled 2016-08-06: qty 50

## 2016-08-06 MED ORDER — PROMETHAZINE HCL 25 MG/ML IJ SOLN: 6.2500 mg | INTRAMUSCULAR | Status: DC | PRN

## 2016-08-06 MED ORDER — MIDAZOLAM HCL 5 MG/5ML IJ SOLN
INTRAMUSCULAR | Status: DC | PRN
  Administered 2016-08-06: 2 mg via INTRAVENOUS

## 2016-08-06 MED ORDER — FENTANYL CITRATE (PF) 100 MCG/2ML IJ SOLN
INTRAMUSCULAR | Status: DC | PRN
  Administered 2016-08-06: 50 ug via INTRAVENOUS

## 2016-08-06 MED ORDER — HYDROMORPHONE HCL 1 MG/ML IJ SOLN: 0.2500 mg | INTRAMUSCULAR | Status: DC | PRN

## 2016-08-06 MED ORDER — DEXTROSE 50 % IV SOLN
INTRAVENOUS | Status: AC
  Administered 2016-08-06: 25 mL via INTRAVENOUS
  Filled 2016-08-06: qty 50

## 2016-08-06 MED ORDER — MIDAZOLAM HCL 2 MG/2ML IJ SOLN
INTRAMUSCULAR | Status: AC
  Filled 2016-08-06: qty 2

## 2016-08-06 MED ORDER — DEXTROSE 50 % IV SOLN
25.0000 mL | Freq: Once | INTRAVENOUS | Status: AC
  Administered 2016-08-06: 25 mL via INTRAVENOUS
  Filled 2016-08-06: qty 50

## 2016-08-06 MED ORDER — 0.9 % SODIUM CHLORIDE (POUR BTL) OPTIME
TOPICAL | Status: DC | PRN
  Administered 2016-08-06: 1000 mL

## 2016-08-06 MED ORDER — FENTANYL CITRATE (PF) 100 MCG/2ML IJ SOLN
INTRAMUSCULAR | Status: AC
  Filled 2016-08-06: qty 2

## 2016-08-06 MED ORDER — PROPOFOL 500 MG/50ML IV EMUL
INTRAVENOUS | Status: DC | PRN
  Administered 2016-08-06: 50 ug/kg/min via INTRAVENOUS

## 2016-08-06 MED ORDER — PROPOFOL 10 MG/ML IV BOLUS
INTRAVENOUS | Status: AC
  Filled 2016-08-06: qty 20

## 2016-08-06 MED ORDER — PROPOFOL 10 MG/ML IV BOLUS
INTRAVENOUS | Status: DC | PRN
  Administered 2016-08-06: 30 mg via INTRAVENOUS

## 2016-08-06 MED ORDER — OXYCODONE-ACETAMINOPHEN 5-325 MG PO TABS: 1.0000 | ORAL_TABLET | ORAL | 0 refills | Status: DC | PRN

## 2016-08-06 MED ORDER — BUPIVACAINE-EPINEPHRINE (PF) 0.5% -1:200000 IJ SOLN
INTRAMUSCULAR | Status: DC | PRN
  Administered 2016-08-06: 30 mL via PERINEURAL

## 2016-08-06 MED ORDER — SODIUM CHLORIDE 0.9 % IV SOLN
INTRAVENOUS | Status: DC
Start: 2016-08-06 — End: 2016-08-06
  Administered 2016-08-06: 08:00:00 via INTRAVENOUS

## 2016-08-06 SURGICAL SUPPLY — 46 items
BANDAGE ESMARK 6X9 LF (GAUZE/BANDAGES/DRESSINGS) IMPLANT
BIT DRILL KIT 2.65MM (BIT) ×2 IMPLANT
BLADE SAW SGTL HD 18.5X60.5X1. (BLADE) ×4 IMPLANT
BLADE SURG 10 STRL SS (BLADE) IMPLANT
BNDG COHESIVE 4X5 TAN STRL (GAUZE/BANDAGES/DRESSINGS) ×4 IMPLANT
BNDG ESMARK 6X9 LF (GAUZE/BANDAGES/DRESSINGS)
BNDG GAUZE ELAST 4 BULKY (GAUZE/BANDAGES/DRESSINGS) ×8 IMPLANT
COTTON STERILE ROLL (GAUZE/BANDAGES/DRESSINGS) ×4 IMPLANT
COVER MAYO STAND STRL (DRAPES) IMPLANT
COVER SURGICAL LIGHT HANDLE (MISCELLANEOUS) ×8 IMPLANT
DRAPE INCISE IOBAN 66X45 STRL (DRAPES) ×8 IMPLANT
DRAPE OEC MINIVIEW 54X84 (DRAPES) IMPLANT
DRAPE U-SHAPE 47X51 STRL (DRAPES) ×4 IMPLANT
DRILL KIT 2.65MM (BIT) ×3
DRSG ADAPTIC 3X8 NADH LF (GAUZE/BANDAGES/DRESSINGS) ×4 IMPLANT
DURAPREP 26ML APPLICATOR (WOUND CARE) ×4 IMPLANT
ELECT REM PT RETURN 9FT ADLT (ELECTROSURGICAL) ×4
ELECTRODE REM PT RTRN 9FT ADLT (ELECTROSURGICAL) ×2 IMPLANT
GAUZE SPONGE 4X4 12PLY STRL (GAUZE/BANDAGES/DRESSINGS) ×4 IMPLANT
GLOVE BIOGEL PI IND STRL 9 (GLOVE) ×2 IMPLANT
GLOVE BIOGEL PI INDICATOR 9 (GLOVE) ×2
GLOVE SURG ORTHO 9.0 STRL STRW (GLOVE) ×4 IMPLANT
GOWN STRL REUS W/ TWL XL LVL3 (GOWN DISPOSABLE) ×6 IMPLANT
GOWN STRL REUS W/TWL XL LVL3 (GOWN DISPOSABLE) ×9
GUIDEWIRE NON THREAD 1.6MM (WIRE) ×8 IMPLANT
KIT BASIN OR (CUSTOM PROCEDURE TRAY) ×4 IMPLANT
KIT ROOM TURNOVER OR (KITS) ×4 IMPLANT
KIT SPEED TITN IMPLNT 25X20X20 (Orthopedic Implant) ×4 IMPLANT
MANIFOLD NEPTUNE II (INSTRUMENTS) ×4 IMPLANT
NS IRRIG 1000ML POUR BTL (IV SOLUTION) ×4 IMPLANT
PACK ORTHO EXTREMITY (CUSTOM PROCEDURE TRAY) ×4 IMPLANT
PAD ARMBOARD 7.5X6 YLW CONV (MISCELLANEOUS) ×8 IMPLANT
PAD CAST 4YDX4 CTTN HI CHSV (CAST SUPPLIES) ×2 IMPLANT
PADDING CAST COTTON 4X4 STRL (CAST SUPPLIES) ×2
PREVENA INCISION MGT 90 150 (MISCELLANEOUS) ×4 IMPLANT
SCREW COMP HEADLEASS 4.5X50 (Screw) ×4 IMPLANT
SCREW HEADLESS 4.5X46MM (Screw) ×4 IMPLANT
SPONGE LAP 18X18 X RAY DECT (DISPOSABLE) ×4 IMPLANT
SUCTION FRAZIER HANDLE 10FR (MISCELLANEOUS) ×2
SUCTION TUBE FRAZIER 10FR DISP (MISCELLANEOUS) ×2 IMPLANT
SUT ETHILON 2 0 PSLX (SUTURE) ×12 IMPLANT
TOWEL OR 17X24 6PK STRL BLUE (TOWEL DISPOSABLE) ×4 IMPLANT
TOWEL OR 17X26 10 PK STRL BLUE (TOWEL DISPOSABLE) ×4 IMPLANT
TUBE CONNECTING 12'X1/4 (SUCTIONS) ×1
TUBE CONNECTING 12X1/4 (SUCTIONS) ×3 IMPLANT
WATER STERILE IRR 1000ML POUR (IV SOLUTION) ×4 IMPLANT

## 2016-08-06 NOTE — Progress Notes (Signed)
Hypoglycemic Event  CBG: 41  Treatment: D50 IV 25 mL  Symptoms: None  Follow-up CBG: Time: CBG Result: CRNA to follow up  Possible Reasons for Event: Inadequate meal intake  Comments/MD notified: Dr. Tobias Alexander and CRNA Ashok Norris Aware    Ronnette Hila

## 2016-08-06 NOTE — H&P (Signed)
Robin [name] is an 66 y.o. female.   Chief Complaint: Painful Charcot collapse right mid foot HPI: Patient is a 66 year old woman diabetic insensate neuropathy Charcot collapse across the Lisfranc joint and base of the first metatarsal. Patient has pain with activities of daily living she has a Charcot deformity and after failed conservative treatment patient presents at this time for internal fixation.  Past Medical History:  Diagnosis Date  . Anemia   . Anxiety   . Arthritis   . Benign hypertension   . Bipolar disorder ([place])   . CHF (congestive heart failure) ([place])   . Chronic kidney disease, stage V [facility])    Nephrologist is with [facility] (Dr. Tammi Klippel)  . Coronary artery disease   . Depression   . Diabetes mellitus without complication ([place])   . DVT (deep venous thrombosis) (H[facility])    right lower leg  . GERD (gastroesophageal reflux disease)   . Heart murmur   . History of blood transfusion   . History of bronchitis   . History of pneumonia   . Shortness of breath dyspnea   . Sleep apnea   . Type 2 diabetes mellitus ([place])     Past Surgical History:  Procedure Laterality Date  . ABDOMINAL HYSTERECTOMY    . ANKLE CLOSED REDUCTION N/A 11/17/2014   Procedure: CLOSED REDUCTION ANKLE;  Surgeon: Earlie Server, MD;  Location: [place];  Service: Orthopedics;  Laterality: N/A;  . ANKLE FUSION Left 03/16/2015   Procedure: Left Tibiocalcaneal Fusion;  Surgeon: Newt Minion, MD;  Location: [place];  Service: Orthopedics;  Laterality: Left;  . AV FISTULA PLACEMENT Left [id number]   done at [place]     2 stent   . CHOLECYSTECTOMY    . CORONARY STENT PLACEMENT    . HARDWARE REMOVAL Left 03/16/2015   Procedure: Removal Hardware Left Ankle;  Surgeon: Newt Minion, MD;  Location: [place];  Service: Orthopedics;  Laterality: Left;  . ORIF ANKLE FRACTURE Left 11/20/2014   Procedure: OPEN REDUCTION INTERNAL FIXATION (ORIF) ANKLE FRACTURE;  Surgeon: Renette Butters,  MD;  Location: [place];  Service: Orthopedics;  Laterality: Left;  . TONSILLECTOMY      No family history on file. Social History:  reports that she has been smoking Cigarettes.  She has been smoking about 1.00 pack per day. She has never used smokeless tobacco. She reports that she does not drink alcohol or use drugs.  Allergies:  Allergies  Allergen Reactions  . Oxycodone Other (See Comments)    Makes patient lethargic, sleeping for few days, altered mental status    Medications Prior to Admission  Medication Sig Dispense Refill  . albuterol (PROVENTIL HFA;VENTOLIN HFA) 108 (90 BASE) MCG/ACT inhaler Inhale 1-2 puffs into the lungs every 6 (six) hours as needed for wheezing or shortness of breath.    Marland Kitchen amLODipine (NORVASC) 10 MG tablet Take 10 mg by mouth at bedtime.     Marland Kitchen aspirin EC 81 MG tablet Take 81 mg by mouth daily.    Marland Kitchen atorvastatin (LIPITOR) 80 MG tablet Take 40 mg by mouth daily.     . benztropine (COGENTIN) 0.5 MG tablet Take 0.5 mg by mouth 2 (two) times daily.    . bisacodyl (BISACODYL) 5 MG EC tablet Take 5 mg by mouth daily.     . bumetanide (BUMEX) 2 MG tablet Take 4 mg by mouth 2 (two) times daily.     . carvedilol (COREG) 25 MG  tablet Take 25 mg by mouth 2 (two) times daily with a meal.    . darbepoetin (ARANESP) 40 MCG/0.4ML SOLN injection Inject 40 mcg into the skin every Friday.     . docusate sodium (COLACE) 100 MG capsule Take 100 mg by mouth daily.    . ferrous sulfate 325 (65 FE) MG tablet Take 325 mg by mouth 2 (two) times daily with a meal.    . fluPHENAZine (PROLIXIN) 1 MG tablet Take 1 mg by mouth daily.     Marland Kitchen gabapentin (NEURONTIN) 300 MG capsule Take 300 mg by mouth daily.     Marland Kitchen glucagon (GLUCAGON EMERGENCY) 1 MG injection Inject 1 mg into the vein once as needed (for low blood sugar).    Marland Kitchen guaiFENesin (MUCINEX) 600 MG 12 hr tablet Take 1 tablet (600 mg total) by mouth 2 (two) times daily. (Patient taking differently: Take 600 mg by mouth every evening. )  30 tablet 0  . hydrALAZINE (APRESOLINE) 100 MG tablet Take 100 mg by mouth 2 (two) times daily.    . insulin aspart (NOVOLOG) 100 UNIT/ML injection Inject 5-15 Units into the skin 3 (three) times daily before meals. Sliding scale  <150 = 0 units >151-250 = 5-8 units  >251-350 = 8-10 units  >350 = 15 units    . insulin detemir (LEVEMIR) 100 UNIT/ML injection Inject 0.12 mLs (12 Units total) into the skin at bedtime. (Patient taking differently: Inject 20-40 Units into the skin 2 (two) times daily. Inject 40 units every morning and 20 units every night.) 10 mL 11  . isosorbide mononitrate (IMDUR) 60 MG 24 hr tablet Take 60 mg by mouth daily with breakfast.     . pantoprazole (PROTONIX) 20 MG tablet Take 20 mg by mouth 2 (two) times daily.     . QUEtiapine (SEROQUEL) 300 MG tablet Take 300 mg by mouth 2 (two) times daily.     Marland Kitchen warfarin (COUMADIN) 5 MG tablet Take 1 tablet (5 mg total) by mouth daily. Take 5mg  every day.      No results found for this or any previous visit (from the past 48 hour(s)). No results found.  Review of Systems  All other systems reviewed and are negative.   There were no vitals taken for this visit. Physical Exam  On examination patient is alert oriented no adenopathy well-dressed normal affect normal S2 effort she does have an antalgic gait she has a good dorsalis pedis pulse she has no cellulitis of the foot she has a Charcot deformity through the Lisfranc joint no open ulcers. Radiograph shows destructive changes at the base of the first metatarsal medial cuneiform and across the Lisfranc complex. Assessment/Plan Assessment: Diabetic insensate neuropathy Charcot collapse across the base of the first metatarsal and Lisfranc joint.  Plan: We'll plan for open reduction internal fixation across the Lisfranc complex including the base of the first metatarsal. Risk and benefits were discussed including infection neurovascular injury persistent pain and need for  additional surgery. Patient states she understands wish to proceed at this time.  Newt Minion, MD 08/06/2016, 6:52 AM

## 2016-08-06 NOTE — Op Note (Signed)
08/06/2016  9:51 AM  PATIENT:  Robin [name]    PRE-OPERATIVE DIAGNOSIS:  Charcot Collapse Right Foot at the Lisfranc joint.  POST-OPERATIVE DIAGNOSIS:  Same  PROCEDURE:  Right Foot Fusion Lisfranc Joint, APPLICATION OF PREVENA WOUND VAC, C-arm fluoroscopy.  SURGEON:  Newt Minion, MD  PHYSICIAN ASSISTANT:None ANESTHESIA:   General  PREOPERATIVE INDICATIONS:  Robin [name] is a  66 y.o. female with a diagnosis of Charcot Collapse Right Foot who failed conservative measures and elected for surgical management.    The risks benefits and alternatives were discussed with the patient preoperatively including but not limited to the risks of infection, bleeding, nerve injury, cardiopulmonary complications, the need for revision surgery, among others, and the patient was willing to proceed.  OPERATIVE IMPLANTS: 4.5 headless screws 2 and a Synthes staple.  OPERATIVE FINDINGS: Chronic degenerative tear and retraction of the EHL secondary to the arthropathy.  OPERATIVE PROCEDURE: Patient brought to the operating room after undergoing a popliteal block. After adequate levels anesthesia obtained patient's right lower extremity was prepped using DuraPrep draped into a sterile field a timeout was called. A dorsal incision was made along the medial column first metatarsal. This was carried down to the MTP joint. There was complete destruction and osteophytic bone spurs. A oscillating saw was used to remove the joint as well as the bone spurs dorsally and medially. After the joint was cleansed of articular cartilage this was reduced and compressed with a 4.5 headless cannulated screw C-arm fluoroscopy verified reduction. This was then reinforced with a staple placed medially. A second screw was then placed from the medial cuneiform across the Lisfranc joint into the base of the second metatarsal. C-arm fluoroscopy verified reduction in both AP and lateral planes. The wound was irrigated with normal saline. A  Prevena wound VAC was applied this had a good suction fit patient was taken to the PACU in stable condition plan for discharge to home.

## 2016-08-06 NOTE — Anesthesia Procedure Notes (Addendum)
Anesthesia Regional Block: Popliteal block   Pre-Anesthetic Checklist: ,, timeout performed, Correct Patient, Correct Site, Correct Laterality, Correct Procedure, Correct Position, site marked, Risks and benefits discussed,  Surgical consent,  Pre-op evaluation,  At surgeon's request and post-op pain management  Laterality: Right  Prep: chloraprep       Needles:  Injection technique: Single-shot  Needle Type: Echogenic Stimulator Needle          Additional Needles:   Procedures: ultrasound guided, nerve stimulator,,,,,,   Nerve Stimulator or Paresthesia:  Response: plantar flexion, 0.45 mA,   Additional Responses:   Narrative:  Start time: 08/06/2016 7:51 AM End time: 08/06/2016 8:01 AM Injection made incrementally with aspirations every 5 mL.  Performed by: Personally  Anesthesiologist: Duane Boston  Additional Notes: A functioning IV was confirmed and monitors were applied.  Sterile prep and drape, hand hygiene and sterile gloves were used.  Negative aspiration and test dose prior to incremental administration of local anesthetic. The patient tolerated the procedure well.Ultrasound  guidance: relevant anatomy identified, needle position confirmed, local anesthetic spread visualized around nerve(s), vascular puncture avoided.  Image printed for medical record.

## 2016-08-06 NOTE — Progress Notes (Signed)
Orthopedic Tech Progress Note Patient Details:  Robin [name] 1951/02/09 [id number]  Ortho Devices Type of Ortho Device: CAM walker Ortho Device/Splint Location: rle Ortho Device/Splint Interventions: Application   Darcus Edds 08/06/2016, 10:04 AM

## 2016-08-06 NOTE — Transfer of Care (Signed)
Immediate Anesthesia Transfer of Care Note  Patient: Robin [name]  Procedure(s) Performed: Procedure(s): Right Foot Fusion Lisfranc Joint (Right) APPLICATION OF PREVENA WOUND VAC (Right)  Patient Location: PACU  Anesthesia Type:MAC and Regional  Level of Consciousness: awake, alert , oriented and patient cooperative  Airway & Oxygen Therapy: Patient Spontanous Breathing and Patient connected to nasal cannula oxygen  Post-op Assessment: Report given to RN and Post -op Vital signs reviewed and stable  Post vital signs: Reviewed and stable  Last Vitals:  Vitals:   08/06/16 0733  BP: 134/62  Pulse: 86  Resp: 20  Temp: 37.2 C    Last Pain:  Vitals:   08/06/16 0733  TempSrc: Oral         Complications: No apparent anesthesia complications

## 2016-08-06 NOTE — Anesthesia Postprocedure Evaluation (Addendum)
Anesthesia Post Note  Patient: Robin [name]  Procedure(s) Performed: Procedure(s) (LRB): Right Foot Fusion Lisfranc Joint (Right) APPLICATION OF PREVENA WOUND VAC (Right)  Patient location during evaluation: PACU Anesthesia Type: Regional Level of consciousness: awake and alert Pain management: pain level controlled Vital Signs Assessment: post-procedure vital signs reviewed and stable Respiratory status: spontaneous breathing and respiratory function stable Cardiovascular status: stable Anesthetic complications: no       Last Vitals:  Vitals:   08/06/16 1015 08/06/16 1023  BP: 133/79 116/67  Pulse: 69 88  Resp: 15 17  Temp:  36.6 C    Last Pain:  Vitals:   08/06/16 1023  TempSrc:   PainSc: 0-No pain        RLE Motor Response: No movement due to regional block (08/06/16 1023) RLE Sensation: Decreased (08/06/16 1023)      Brodee Mauritz DANIEL

## 2016-08-06 NOTE — Addendum Note (Signed)
Addendum  created 08/06/16 1419 by Shirlyn Goltz, CRNA   Anesthesia Intra Meds edited

## 2016-08-07 ENCOUNTER — Emergency Department (HOSPITAL_COMMUNITY): Payer: Non-veteran care

## 2016-08-07 ENCOUNTER — Inpatient Hospital Stay (HOSPITAL_COMMUNITY)
Admission: EM | Admit: 2016-08-07 | Discharge: 2016-08-12 | DRG: 981 | Disposition: A | Discharge status: Skilled Nursing Facility | Payer: Non-veteran care | Attending: Internal Medicine | Admitting: Internal Medicine

## 2016-08-07 ENCOUNTER — Encounter (HOSPITAL_COMMUNITY): Payer: Self-pay | Admitting: Orthopedic Surgery

## 2016-08-07 DIAGNOSIS — I509 Heart failure, unspecified: Secondary | ICD-10-CM | POA: Diagnosis not present

## 2016-08-07 DIAGNOSIS — Z955 Presence of coronary angioplasty implant and graft: Secondary | ICD-10-CM | POA: Diagnosis not present

## 2016-08-07 DIAGNOSIS — I824Y1 Acute embolism and thrombosis of unspecified deep veins of right proximal lower extremity: Secondary | ICD-10-CM

## 2016-08-07 DIAGNOSIS — I5043 Acute on chronic combined systolic (congestive) and diastolic (congestive) heart failure: Secondary | ICD-10-CM | POA: Diagnosis present

## 2016-08-07 DIAGNOSIS — E1165 Type 2 diabetes mellitus with hyperglycemia: Secondary | ICD-10-CM | POA: Diagnosis not present

## 2016-08-07 DIAGNOSIS — M6281 Muscle weakness (generalized): Secondary | ICD-10-CM | POA: Diagnosis not present

## 2016-08-07 DIAGNOSIS — J189 Pneumonia, unspecified organism: Secondary | ICD-10-CM | POA: Diagnosis not present

## 2016-08-07 DIAGNOSIS — D509 Iron deficiency anemia, unspecified: Secondary | ICD-10-CM | POA: Diagnosis present

## 2016-08-07 DIAGNOSIS — R05 Cough: Secondary | ICD-10-CM | POA: Diagnosis not present

## 2016-08-07 DIAGNOSIS — I10 Essential (primary) hypertension: Secondary | ICD-10-CM | POA: Diagnosis not present

## 2016-08-07 DIAGNOSIS — I5033 Acute on chronic diastolic (congestive) heart failure: Secondary | ICD-10-CM | POA: Diagnosis not present

## 2016-08-07 DIAGNOSIS — R0600 Dyspnea, unspecified: Secondary | ICD-10-CM | POA: Diagnosis not present

## 2016-08-07 DIAGNOSIS — E1159 Type 2 diabetes mellitus with other circulatory complications: Secondary | ICD-10-CM | POA: Diagnosis present

## 2016-08-07 DIAGNOSIS — R791 Abnormal coagulation profile: Secondary | ICD-10-CM | POA: Diagnosis present

## 2016-08-07 DIAGNOSIS — I5032 Chronic diastolic (congestive) heart failure: Secondary | ICD-10-CM | POA: Diagnosis not present

## 2016-08-07 DIAGNOSIS — R6510 Systemic inflammatory response syndrome (SIRS) of non-infectious origin without acute organ dysfunction: Secondary | ICD-10-CM | POA: Diagnosis present

## 2016-08-07 DIAGNOSIS — R2689 Other abnormalities of gait and mobility: Secondary | ICD-10-CM | POA: Diagnosis not present

## 2016-08-07 DIAGNOSIS — N184 Chronic kidney disease, stage 4 (severe): Secondary | ICD-10-CM

## 2016-08-07 DIAGNOSIS — Z794 Long term (current) use of insulin: Secondary | ICD-10-CM | POA: Diagnosis not present

## 2016-08-07 DIAGNOSIS — I82501 Chronic embolism and thrombosis of unspecified deep veins of right lower extremity: Secondary | ICD-10-CM | POA: Diagnosis present

## 2016-08-07 DIAGNOSIS — E8770 Fluid overload, unspecified: Secondary | ICD-10-CM | POA: Diagnosis present

## 2016-08-07 DIAGNOSIS — J9601 Acute respiratory failure with hypoxia: Secondary | ICD-10-CM | POA: Diagnosis present

## 2016-08-07 DIAGNOSIS — K219 Gastro-esophageal reflux disease without esophagitis: Secondary | ICD-10-CM | POA: Diagnosis present

## 2016-08-07 DIAGNOSIS — R0602 Shortness of breath: Secondary | ICD-10-CM | POA: Diagnosis not present

## 2016-08-07 DIAGNOSIS — Z7901 Long term (current) use of anticoagulants: Secondary | ICD-10-CM | POA: Diagnosis not present

## 2016-08-07 DIAGNOSIS — N185 Chronic kidney disease, stage 5: Secondary | ICD-10-CM | POA: Diagnosis present

## 2016-08-07 DIAGNOSIS — N189 Chronic kidney disease, unspecified: Secondary | ICD-10-CM

## 2016-08-07 DIAGNOSIS — E876 Hypokalemia: Secondary | ICD-10-CM | POA: Diagnosis present

## 2016-08-07 DIAGNOSIS — Z7982 Long term (current) use of aspirin: Secondary | ICD-10-CM | POA: Diagnosis not present

## 2016-08-07 DIAGNOSIS — F319 Bipolar disorder, unspecified: Secondary | ICD-10-CM | POA: Diagnosis present

## 2016-08-07 DIAGNOSIS — R5381 Other malaise: Secondary | ICD-10-CM | POA: Diagnosis not present

## 2016-08-07 DIAGNOSIS — E11610 Type 2 diabetes mellitus with diabetic neuropathic arthropathy: Secondary | ICD-10-CM | POA: Diagnosis present

## 2016-08-07 DIAGNOSIS — D631 Anemia in chronic kidney disease: Secondary | ICD-10-CM | POA: Diagnosis not present

## 2016-08-07 DIAGNOSIS — Z8701 Personal history of pneumonia (recurrent): Secondary | ICD-10-CM | POA: Diagnosis not present

## 2016-08-07 DIAGNOSIS — J440 Chronic obstructive pulmonary disease with acute lower respiratory infection: Secondary | ICD-10-CM | POA: Diagnosis present

## 2016-08-07 DIAGNOSIS — E1122 Type 2 diabetes mellitus with diabetic chronic kidney disease: Secondary | ICD-10-CM | POA: Diagnosis present

## 2016-08-07 DIAGNOSIS — I132 Hypertensive heart and chronic kidney disease with heart failure and with stage 5 chronic kidney disease, or end stage renal disease: Secondary | ICD-10-CM | POA: Diagnosis present

## 2016-08-07 DIAGNOSIS — J810 Acute pulmonary edema: Secondary | ICD-10-CM | POA: Diagnosis not present

## 2016-08-07 DIAGNOSIS — I2510 Atherosclerotic heart disease of native coronary artery without angina pectoris: Secondary | ICD-10-CM | POA: Diagnosis present

## 2016-08-07 DIAGNOSIS — I82591 Chronic embolism and thrombosis of other specified deep vein of right lower extremity: Secondary | ICD-10-CM | POA: Diagnosis not present

## 2016-08-07 LAB — PROTIME-INR
INR: 1.16
PROTHROMBIN TIME: 14.8 s (ref 11.4–15.2)

## 2016-08-07 LAB — CBC WITH DIFFERENTIAL/PLATELET
Basophils Absolute: 0 10*3/uL (ref 0.0–0.1)
Basophils Relative: 0 %
Eosinophils Absolute: 0 10*3/uL (ref 0.0–0.7)
Eosinophils Relative: 0 %
HEMATOCRIT: 26.9 % — AB (ref 36.0–46.0)
HEMOGLOBIN: 9.2 g/dL — AB (ref 12.0–15.0)
LYMPHS ABS: 0.9 10*3/uL (ref 0.7–4.0)
LYMPHS PCT: 13 %
MCH: 30.7 pg (ref 26.0–34.0)
MCHC: 34.2 g/dL (ref 30.0–36.0)
MCV: 89.7 fL (ref 78.0–100.0)
Monocytes Absolute: 1.3 10*3/uL — ABNORMAL HIGH (ref 0.1–1.0)
Monocytes Relative: 19 %
NEUTROS PCT: 68 %
Neutro Abs: 4.7 10*3/uL (ref 1.7–7.7)
Platelets: 174 10*3/uL (ref 150–400)
RBC: 3 MIL/uL — AB (ref 3.87–5.11)
RDW: 15.5 % (ref 11.5–15.5)
WBC: 6.9 10*3/uL (ref 4.0–10.5)

## 2016-08-07 LAB — COMPREHENSIVE METABOLIC PANEL
ALT: 11 U/L — ABNORMAL LOW (ref 14–54)
AST: 37 U/L (ref 15–41)
Albumin: 3.1 g/dL — ABNORMAL LOW (ref 3.5–5.0)
Alkaline Phosphatase: 65 U/L (ref 38–126)
Anion gap: 10 (ref 5–15)
BILIRUBIN TOTAL: 0.5 mg/dL (ref 0.3–1.2)
BUN: 50 mg/dL — AB (ref 6–20)
CO2: 24 mmol/L (ref 22–32)
Calcium: 8.5 mg/dL — ABNORMAL LOW (ref 8.9–10.3)
Chloride: 105 mmol/L (ref 101–111)
Creatinine, Ser: 4.63 mg/dL — ABNORMAL HIGH (ref 0.44–1.00)
GFR, EST AFRICAN AMERICAN: 11 mL/min — AB (ref >60)
GFR, EST NON AFRICAN AMERICAN: 9 mL/min — AB (ref >60)
Glucose, Bld: 46 mg/dL — ABNORMAL LOW (ref 65–99)
Potassium: 2.8 mmol/L — ABNORMAL LOW (ref 3.5–5.1)
Sodium: 139 mmol/L (ref 135–145)
TOTAL PROTEIN: 7.1 g/dL (ref 6.5–8.1)

## 2016-08-07 LAB — I-STAT CG4 LACTIC ACID, ED
LACTIC ACID, VENOUS: 0.61 mmol/L (ref 0.5–1.9)
Lactic Acid, Venous: 1.11 mmol/L (ref 0.5–1.9)

## 2016-08-07 LAB — TROPONIN I: Troponin I: 0.13 ng/mL (ref <0.03)

## 2016-08-07 MED ORDER — POTASSIUM CHLORIDE CRYS ER 20 MEQ PO TBCR
40.0000 meq | EXTENDED_RELEASE_TABLET | Freq: Once | ORAL | Status: AC
  Administered 2016-08-08: 40 meq via ORAL
  Filled 2016-08-07: qty 2

## 2016-08-07 MED ORDER — ALBUTEROL SULFATE (2.5 MG/3ML) 0.083% IN NEBU
5.0000 mg | INHALATION_SOLUTION | Freq: Once | RESPIRATORY_TRACT | Status: AC
  Administered 2016-08-07: 5 mg via RESPIRATORY_TRACT
  Filled 2016-08-07: qty 6

## 2016-08-07 MED ORDER — FUROSEMIDE 10 MG/ML IJ SOLN
60.0000 mg | Freq: Once | INTRAMUSCULAR | Status: AC
  Administered 2016-08-07: 60 mg via INTRAVENOUS
  Filled 2016-08-07: qty 8

## 2016-08-07 NOTE — ED Notes (Signed)
Documented in error If O2 Sat <94% administer O2 at 2 liters/minute via nasal cannula.

## 2016-08-07 NOTE — ED Triage Notes (Signed)
Patient c/o fever, SOB, and a productive cough with green sputum since last night.

## 2016-08-07 NOTE — ED Provider Notes (Signed)
[facility] DEPT Provider Note   CSN: [id number] Arrival date & time: 08/07/16  [place]     History   Chief Complaint Chief Complaint  Patient presents with  . Shortness of Breath  . Fever  . Cough    HPI Robin [name] is a 66 y.o. female.  66 year old female presents with 24-hour history of worsening orthopnea and dyspnea. Does have a history of CHF. Also has had rigors and subjective fever but has not taken her temperature. Denies any vomiting or diarrhea. Recently had right foot surgery. Denies any urinary symptoms. Some sore throat. Dyspnea is worse with any activity. No tumor use prior to arrival.      Past Medical History:  Diagnosis Date  . Anemia   . Anxiety   . Arthritis   . Benign hypertension   . Bipolar disorder ([place])   . CHF (congestive heart failure) ([place])   . Chronic kidney disease, stage V [facility])    Nephrologist is with [facility] (Dr. Tammi Klippel)  . Coronary artery disease   . Depression   . Diabetes mellitus without complication ([place])   . DVT (deep venous thrombosis) ([facility])    right lower leg  . GERD (gastroesophageal reflux disease)   . Heart murmur   . History of blood transfusion   . History of bronchitis   . History of pneumonia   . Shortness of breath dyspnea   . Sleep apnea   . Type 2 diabetes mellitus [facility])     Patient Active Problem List   Diagnosis Date Noted  . CAP (community acquired pneumonia) 03/13/2016  . Right leg DVT ([place]) 12/05/2015  . Acute respiratory failure with hypoxemia ([place])   . Charcot foot due to diabetes mellitus ([place])   . Hyperlipidemia 03/26/2015  . Charcot ankle 03/16/2015  . Confusion 01/21/2015  . Anemia 01/21/2015  . Multiple falls 01/21/2015  . Acute on chronic diastolic heart failure ([place]) 01/21/2015  . Anemia, chronic renal failure   . Ventricular tachycardia ([place])   . Chronic renal disease, stage 4, severely decreased glomerular filtration rate between 15-29 mL/min/1.73 square meter ([facility])   . Anemia in  chronic kidney disease 12/09/2014  . CKD (chronic kidney disease) 12/09/2014  . Hypertensive heart/renal disease with failure ([place]) 12/09/2014  . Bipolar affective disorder ([place]) 12/09/2014  . Acute encephalopathy   . Acute delirium 11/18/2014  . Type 2 diabetes mellitus with hyperglycemia ([place]) 11/18/2014  . Essential hypertension 11/18/2014  . Chronic combined systolic and diastolic CHF (congestive heart failure) ([place]) 11/18/2014  . Fracture dislocation of ankle 11/17/2014  . DM type 2, uncontrolled, with renal complications ([place]) 16/60/6301  . Hypokalemia 11/17/2014  . Essential hypertension, benign 11/17/2014  . Obesity 11/17/2014  . CAD (coronary artery disease), native coronary artery with 2 stents  11/17/2014    Past Surgical History:  Procedure Laterality Date  . ABDOMINAL HYSTERECTOMY    . ANKLE CLOSED REDUCTION N/A 11/17/2014   Procedure: CLOSED REDUCTION ANKLE;  Surgeon: Earlie Server, MD;  Location: [place];  Service: Orthopedics;  Laterality: N/A;  . ANKLE FUSION Left 03/16/2015   Procedure: Left Tibiocalcaneal Fusion;  Surgeon: Newt Minion, MD;  Location: [place];  Service: Orthopedics;  Laterality: Left;  . APPLICATION OF WOUND VAC Right 08/06/2016   Procedure: APPLICATION OF PREVENA WOUND VAC;  Surgeon: Newt Minion, MD;  Location: [place];  Service: Orthopedics;  Laterality: Right;  . AV FISTULA PLACEMENT Left [id number]   done at [place]  2 stent   . CHOLECYSTECTOMY    . CORONARY STENT PLACEMENT    . FOOT ARTHRODESIS Right 08/06/2016   Procedure: Right Foot Fusion Lisfranc Joint;  Surgeon: Newt Minion, MD;  Location: [place];  Service: Orthopedics;  Laterality: Right;  . HARDWARE REMOVAL Left 03/16/2015   Procedure: Removal Hardware Left Ankle;  Surgeon: Newt Minion, MD;  Location: [place];  Service: Orthopedics;  Laterality: Left;  . ORIF ANKLE FRACTURE Left 11/20/2014   Procedure: OPEN REDUCTION INTERNAL FIXATION (ORIF) ANKLE FRACTURE;   Surgeon: Renette Butters, MD;  Location: [place];  Service: Orthopedics;  Laterality: Left;  . TONSILLECTOMY      OB History    No data available       Home Medications    Prior to Admission medications   Medication Sig Start Date End Date Taking? Authorizing Provider  albuterol (PROVENTIL HFA;VENTOLIN HFA) 108 (90 BASE) MCG/ACT inhaler Inhale 1-2 puffs into the lungs every 6 (six) hours as needed for wheezing or shortness of breath.    Historical Provider, MD  amLODipine (NORVASC) 10 MG tablet Take 10 mg by mouth at bedtime.     Historical Provider, MD  aspirin EC 81 MG tablet Take 81 mg by mouth daily.    Historical Provider, MD  atorvastatin (LIPITOR) 80 MG tablet Take 40 mg by mouth daily.     Historical Provider, MD  benztropine (COGENTIN) 0.5 MG tablet Take 0.5 mg by mouth 2 (two) times daily.    Historical Provider, MD  bisacodyl (BISACODYL) 5 MG EC tablet Take 5 mg by mouth daily.     Historical Provider, MD  bumetanide (BUMEX) 2 MG tablet Take 4 mg by mouth 2 (two) times daily.     Historical Provider, MD  carvedilol (COREG) 25 MG tablet Take 25 mg by mouth 2 (two) times daily with a meal.    Historical Provider, MD  darbepoetin (ARANESP) 40 MCG/0.4ML SOLN injection Inject 40 mcg into the skin every 30 (thirty) days. Once a month    Historical Provider, MD  docusate sodium (COLACE) 100 MG capsule Take 100 mg by mouth daily.    Historical Provider, MD  ferrous sulfate 325 (65 FE) MG tablet Take 325 mg by mouth 2 (two) times daily with a meal.    Historical Provider, MD  fluPHENAZine (PROLIXIN) 1 MG tablet Take 1 mg by mouth daily.     Historical Provider, MD  gabapentin (NEURONTIN) 300 MG capsule Take 100 mg by mouth daily.     Historical Provider, MD  glucagon (GLUCAGON EMERGENCY) 1 MG injection Inject 1 mg into the vein once as needed (for low blood sugar).    Historical Provider, MD  guaiFENesin (MUCINEX) 600 MG 12 hr tablet Take 1 tablet (600 mg total) by mouth 2 (two) times  daily. Patient taking differently: Take 600 mg by mouth every evening.  03/17/16   Bonnielee Haff, MD  hydrALAZINE (APRESOLINE) 100 MG tablet Take 100 mg by mouth 2 (two) times daily.    Historical Provider, MD  insulin aspart (NOVOLOG) 100 UNIT/ML injection Inject 5-15 Units into the skin 3 (three) times daily before meals. Sliding scale  <150 = 0 units >151-250 = 5-8 units  >251-350 = 8-10 units  >350 = 15 units    Historical Provider, MD  insulin detemir (LEVEMIR) 100 UNIT/ML injection Inject 0.12 mLs (12 Units total) into the skin at bedtime. Patient taking differently: Inject 20-40 Units into the skin 2 (two) times daily. Inject  40 units every morning and 20 units every night. 11/22/14   Bonnielee Haff, MD  isosorbide mononitrate (IMDUR) 60 MG 24 hr tablet Take 60 mg by mouth daily with breakfast.     Historical Provider, MD  Melatonin 5 MG TABS Take 5 mg by mouth at bedtime.    Historical Provider, MD  oxyCODONE-acetaminophen (ROXICET) 5-325 MG tablet Take 1 tablet by mouth every 4 (four) hours as needed for severe pain. 08/06/16   Newt Minion, MD  pantoprazole (PROTONIX) 20 MG tablet Take 20 mg by mouth 2 (two) times daily.     Historical Provider, MD  QUEtiapine (SEROQUEL) 300 MG tablet Take 400 mg by mouth 2 (two) times daily.     Historical Provider, MD  warfarin (COUMADIN) 5 MG tablet Take 1 tablet (5 mg total) by mouth daily. Take 5mg  every day. 03/17/16   Bonnielee Haff, MD    Family History Family History  Problem Relation Age of Onset  . Family history unknown: Yes    Social History Social History  Substance Use Topics  . Smoking status: Current Every Day Smoker    Packs/day: 1.00    Types: Cigarettes  . Smokeless tobacco: Never Used  . Alcohol use No     Allergies   Oxycodone   Review of Systems Review of Systems  All other systems reviewed and are negative.    Physical Exam Updated Vital Signs BP (!) 154/72 (BP Location: Right Arm)   Pulse 96   Temp  99.2 F (37.3 C) (Oral)   Resp 16   Ht 5\' 4"  (1.626 m)   Wt 75.8 kg   SpO2 96%   BMI 28.67 kg/m   Physical Exam  Constitutional: She is oriented to person, place, and time. She appears well-developed and well-nourished.  Non-toxic appearance. No distress.  HENT:  Head: Normocephalic and atraumatic.  Eyes: Conjunctivae, EOM and lids are normal. Pupils are equal, round, and reactive to light.  Neck: Normal range of motion. Neck supple. No tracheal deviation present. No thyroid mass present.  Cardiovascular: Normal rate, regular rhythm and normal heart sounds.  Exam reveals no gallop.   No murmur heard. Pulmonary/Chest: Effort normal. No stridor. No respiratory distress. She has decreased breath sounds in the right lower field and the left lower field. She has no wheezes. She has no rhonchi. She has rales in the right lower field and the left lower field.  Abdominal: Soft. Normal appearance and bowel sounds are normal. She exhibits no distension. There is no tenderness. There is no rebound and no CVA tenderness.  Musculoskeletal: Normal range of motion. She exhibits no edema or tenderness.       Feet:  Neurological: She is alert and oriented to person, place, and time. She has normal strength. No cranial nerve deficit or sensory deficit. GCS eye subscore is 4. GCS verbal subscore is 5. GCS motor subscore is 6.  Skin: Skin is warm and dry. No abrasion and no rash noted.  Psychiatric: She has a normal mood and affect. Her speech is normal and behavior is normal.  Nursing note and vitals reviewed.    ED Treatments / Results  Labs (all labs ordered are listed, but only abnormal results are displayed) Labs Reviewed  CULTURE, BLOOD (ROUTINE X 2)  CULTURE, BLOOD (ROUTINE X 2)  COMPREHENSIVE METABOLIC PANEL  CBC WITH DIFFERENTIAL/PLATELET  URINALYSIS, ROUTINE W REFLEX MICROSCOPIC  BRAIN NATRIURETIC PEPTIDE  TROPONIN I  PROTIME-INR  I-STAT CG4 LACTIC ACID, ED  EKG  EKG  Interpretation  Date/Time:  Thursday August 07 2016 19:05:23 EDT Ventricular Rate:  96 PR Interval:    QRS Duration: 90 QT Interval:  392 QTC Calculation: 496 R Axis:   -38 Text Interpretation:  Sinus tachycardia Multiple ventricular premature complexes Abnormal R-wave progression, late transition LVH with secondary repolarization abnormality Borderline prolonged QT interval Confirmed by Zenia Resides  MD, Phylisha Dix ([id number]) on 08/07/2016 8:44:53 PM       Radiology Dg Chest 2 View  Result Date: 08/07/2016 CLINICAL DATA:  66 year old female with shortness of breath. EXAM: CHEST  2 VIEW COMPARISON:  Chest radiograph dated 03/13/2016 FINDINGS: There is moderate cardiomegaly with mildly prominent central vasculature and mild cephalization concerning for mild vascular congestion. No pulmonary edema. There is no focal consolidation, pleural effusion, or pneumothorax. No acute osseous pathology identified. IMPRESSION: Moderate cardiomegaly with pulmonary vascular congestion. No pulmonary edema or focal consolidation. Electronically Signed   By: Anner Crete M.D.   On: 08/07/2016 19:54    Procedures Procedures (including critical care time)  Medications Ordered in ED Medications  furosemide (LASIX) injection 60 mg (not administered)  albuterol (PROVENTIL) (2.5 MG/3ML) 0.083% nebulizer solution 5 mg (5 mg Nebulization Given 08/07/16 1905)     Initial Impression / Assessment and Plan / ED Course  I have reviewed the triage vital signs and the nursing notes.  Pertinent labs & imaging results that were available during my care of the patient were reviewed by me and considered in my medical decision making (see chart for details).    Patient treated with IV Lasix here. Patient's creatinine noted. Also given oral potassium here. Only one dose due to her creatinine. Patient has elevated troponin but no evidence of ACS. Will be admitted to the medicine service.   Final Clinical Impressions(s) / ED  Diagnoses   Final diagnoses:  None    New Prescriptions New Prescriptions   No medications on file     Lacretia Leigh, MD 08/07/16 2308

## 2016-08-08 ENCOUNTER — Encounter (HOSPITAL_COMMUNITY): Payer: Self-pay

## 2016-08-08 DIAGNOSIS — R6510 Systemic inflammatory response syndrome (SIRS) of non-infectious origin without acute organ dysfunction: Secondary | ICD-10-CM

## 2016-08-08 DIAGNOSIS — E1165 Type 2 diabetes mellitus with hyperglycemia: Secondary | ICD-10-CM

## 2016-08-08 HISTORY — DX: Systemic inflammatory response syndrome (sirs) of non-infectious origin without acute organ dysfunction: R65.10

## 2016-08-08 LAB — GLUCOSE, CAPILLARY
GLUCOSE-CAPILLARY: 145 mg/dL — AB (ref 65–99)
Glucose-Capillary: 98 mg/dL (ref 65–99)
Glucose-Capillary: 114 mg/dL — ABNORMAL HIGH (ref 65–99)
Glucose-Capillary: 74 mg/dL (ref 65–99)

## 2016-08-08 LAB — BRAIN NATRIURETIC PEPTIDE: B Natriuretic Peptide: 486.4 pg/mL — ABNORMAL HIGH (ref 0.0–100.0)

## 2016-08-08 LAB — CREATININE, SERUM
Creatinine, Ser: 4.78 mg/dL — ABNORMAL HIGH (ref 0.44–1.00)
GFR calc non Af Amer: 9 mL/min — ABNORMAL LOW (ref >60)
GFR, EST AFRICAN AMERICAN: 10 mL/min — AB (ref >60)

## 2016-08-08 LAB — CBC
HCT: 25.2 % — ABNORMAL LOW (ref 36.0–46.0)
HEMATOCRIT: 25.2 % — AB (ref 36.0–46.0)
HEMOGLOBIN: 8.4 g/dL — AB (ref 12.0–15.0)
Hemoglobin: 8.4 g/dL — ABNORMAL LOW (ref 12.0–15.0)
MCH: 30.7 pg (ref 26.0–34.0)
MCH: 30.8 pg (ref 26.0–34.0)
MCHC: 33.3 g/dL (ref 30.0–36.0)
MCHC: 33.3 g/dL (ref 30.0–36.0)
MCV: 92 fL (ref 78.0–100.0)
MCV: 92.3 fL (ref 78.0–100.0)
Platelets: 161 10*3/uL (ref 150–400)
Platelets: 165 10*3/uL (ref 150–400)
RBC: 2.74 MIL/uL — AB (ref 3.87–5.11)
RBC: 2.73 MIL/uL — ABNORMAL LOW (ref 3.87–5.11)
RDW: 15.7 % — ABNORMAL HIGH (ref 11.5–15.5)
RDW: 15.8 % — ABNORMAL HIGH (ref 11.5–15.5)
WBC: 6.2 10*3/uL (ref 4.0–10.5)
WBC: 6.4 10*3/uL (ref 4.0–10.5)

## 2016-08-08 LAB — BASIC METABOLIC PANEL
Anion gap: 11 (ref 5–15)
BUN: 52 mg/dL — ABNORMAL HIGH (ref 6–20)
CALCIUM: 8.3 mg/dL — AB (ref 8.9–10.3)
CHLORIDE: 102 mmol/L (ref 101–111)
CO2: 23 mmol/L (ref 22–32)
Creatinine, Ser: 4.86 mg/dL — ABNORMAL HIGH (ref 0.44–1.00)
GFR calc non Af Amer: 9 mL/min — ABNORMAL LOW (ref >60)
GFR, EST AFRICAN AMERICAN: 10 mL/min — AB (ref >60)
Glucose, Bld: 144 mg/dL — ABNORMAL HIGH (ref 65–99)
Potassium: 2.9 mmol/L — ABNORMAL LOW (ref 3.5–5.1)
Sodium: 136 mmol/L (ref 135–145)

## 2016-08-08 LAB — HEPARIN LEVEL (UNFRACTIONATED)
HEPARIN UNFRACTIONATED: 0.38 [IU]/mL (ref 0.30–0.70)
Heparin Unfractionated: <0.1 IU/mL — ABNORMAL LOW (ref 0.30–0.70)

## 2016-08-08 LAB — APTT: APTT: 33 s (ref 24–36)

## 2016-08-08 LAB — TROPONIN I
Troponin I: 0.11 ng/mL (ref <0.03)
Troponin I: 0.09 ng/mL (ref <0.03)
Troponin I: 0.11 ng/mL (ref <0.03)

## 2016-08-08 LAB — INFLUENZA PANEL BY PCR (TYPE A & B)
INFLBPCR: NEGATIVE
Influenza A By PCR: NEGATIVE

## 2016-08-08 MED ORDER — CARVEDILOL 25 MG PO TABS
25.0000 mg | ORAL_TABLET | Freq: Two times a day (BID) | ORAL | Status: DC
  Administered 2016-08-08 – 2016-08-12 (×9): 25 mg via ORAL
  Filled 2016-08-08 (×9): qty 1

## 2016-08-08 MED ORDER — WARFARIN - PHARMACIST DOSING INPATIENT
Freq: Every day | Status: DC
  Administered 2016-08-11: 18:00:00

## 2016-08-08 MED ORDER — FLUPHENAZINE HCL 1 MG PO TABS
1.0000 mg | ORAL_TABLET | Freq: Every day | ORAL | Status: DC
  Administered 2016-08-08 – 2016-08-11 (×4): 1 mg via ORAL
  Filled 2016-08-08 (×4): qty 1

## 2016-08-08 MED ORDER — ISOSORBIDE MONONITRATE ER 30 MG PO TB24
60.0000 mg | ORAL_TABLET | Freq: Every day | ORAL | Status: DC
  Administered 2016-08-08 – 2016-08-12 (×5): 60 mg via ORAL
  Filled 2016-08-08 (×5): qty 2

## 2016-08-08 MED ORDER — ATORVASTATIN CALCIUM 40 MG PO TABS
40.0000 mg | ORAL_TABLET | Freq: Every day | ORAL | Status: DC
  Administered 2016-08-08 – 2016-08-11 (×4): 40 mg via ORAL
  Filled 2016-08-08 (×4): qty 1

## 2016-08-08 MED ORDER — PANTOPRAZOLE SODIUM 20 MG PO TBEC
20.0000 mg | DELAYED_RELEASE_TABLET | Freq: Two times a day (BID) | ORAL | Status: DC
  Administered 2016-08-08 – 2016-08-12 (×9): 20 mg via ORAL
  Filled 2016-08-08 (×9): qty 1

## 2016-08-08 MED ORDER — OXYCODONE-ACETAMINOPHEN 5-325 MG PO TABS
0.5000 | ORAL_TABLET | ORAL | Status: DC | PRN
  Administered 2016-08-08 – 2016-08-10 (×4): 1 via ORAL
  Administered 2016-08-11: 0.5 via ORAL
  Administered 2016-08-11: 1 via ORAL
  Filled 2016-08-08 (×6): qty 1

## 2016-08-08 MED ORDER — BUMETANIDE 2 MG PO TABS
4.0000 mg | ORAL_TABLET | Freq: Two times a day (BID) | ORAL | Status: DC
  Administered 2016-08-08 – 2016-08-12 (×9): 4 mg via ORAL
  Filled 2016-08-08 (×10): qty 2

## 2016-08-08 MED ORDER — MELATONIN 5 MG PO TABS: 5.0000 mg | ORAL_TABLET | Freq: Every day | ORAL | Status: DC

## 2016-08-08 MED ORDER — HEPARIN (PORCINE) IN NACL 100-0.45 UNIT/ML-% IJ SOLN
1450.0000 [IU]/h | INTRAMUSCULAR | Status: DC
  Administered 2016-08-08 – 2016-08-10 (×3): 1100 [IU]/h via INTRAVENOUS
  Administered 2016-08-11: 1450 [IU]/h via INTRAVENOUS
  Filled 2016-08-08 (×5): qty 250

## 2016-08-08 MED ORDER — ALBUTEROL SULFATE HFA 108 (90 BASE) MCG/ACT IN AERS: 1.0000 | INHALATION_SPRAY | Freq: Four times a day (QID) | RESPIRATORY_TRACT | Status: DC | PRN

## 2016-08-08 MED ORDER — INSULIN ASPART 100 UNIT/ML ~~LOC~~ SOLN
0.0000 [IU] | Freq: Three times a day (TID) | SUBCUTANEOUS | Status: DC
Start: 2016-08-08 — End: 2016-08-12
  Administered 2016-08-08: 1 [IU] via SUBCUTANEOUS
  Administered 2016-08-09 (×2): 2 [IU] via SUBCUTANEOUS
  Administered 2016-08-10: 3 [IU] via SUBCUTANEOUS
  Administered 2016-08-10: 1 [IU] via SUBCUTANEOUS
  Administered 2016-08-10 – 2016-08-11 (×3): 2 [IU] via SUBCUTANEOUS
  Administered 2016-08-11: 3 [IU] via SUBCUTANEOUS
  Administered 2016-08-12 (×2): 2 [IU] via SUBCUTANEOUS

## 2016-08-08 MED ORDER — HYDRALAZINE HCL 50 MG PO TABS
100.0000 mg | ORAL_TABLET | Freq: Two times a day (BID) | ORAL | Status: DC
  Administered 2016-08-08 – 2016-08-12 (×9): 100 mg via ORAL
  Filled 2016-08-08 (×9): qty 2

## 2016-08-08 MED ORDER — POTASSIUM CHLORIDE CRYS ER 20 MEQ PO TBCR
40.0000 meq | EXTENDED_RELEASE_TABLET | Freq: Once | ORAL | Status: AC
  Administered 2016-08-08: 40 meq via ORAL
  Filled 2016-08-08: qty 2

## 2016-08-08 MED ORDER — INSULIN DETEMIR 100 UNIT/ML ~~LOC~~ SOLN
20.0000 [IU] | Freq: Every day | SUBCUTANEOUS | Status: DC
  Filled 2016-08-08: qty 0.2

## 2016-08-08 MED ORDER — AMLODIPINE BESYLATE 10 MG PO TABS
10.0000 mg | ORAL_TABLET | Freq: Every day | ORAL | Status: DC
  Administered 2016-08-08 – 2016-08-12 (×5): 10 mg via ORAL
  Filled 2016-08-08 (×5): qty 1

## 2016-08-08 MED ORDER — GABAPENTIN 100 MG PO CAPS
100.0000 mg | ORAL_CAPSULE | Freq: Every day | ORAL | Status: DC
  Administered 2016-08-08 – 2016-08-11 (×4): 100 mg via ORAL
  Filled 2016-08-08 (×4): qty 1

## 2016-08-08 MED ORDER — DOCUSATE SODIUM 100 MG PO CAPS
100.0000 mg | ORAL_CAPSULE | Freq: Two times a day (BID) | ORAL | Status: DC
  Administered 2016-08-08 – 2016-08-12 (×9): 100 mg via ORAL
  Filled 2016-08-08 (×9): qty 1

## 2016-08-08 MED ORDER — IPRATROPIUM-ALBUTEROL 0.5-2.5 (3) MG/3ML IN SOLN: 3.0000 mL | RESPIRATORY_TRACT | Status: DC | PRN

## 2016-08-08 MED ORDER — ALBUTEROL SULFATE (2.5 MG/3ML) 0.083% IN NEBU: 2.5000 mg | INHALATION_SOLUTION | RESPIRATORY_TRACT | Status: DC | PRN

## 2016-08-08 MED ORDER — QUETIAPINE FUMARATE 200 MG PO TABS
400.0000 mg | ORAL_TABLET | Freq: Every day | ORAL | Status: DC
  Administered 2016-08-08 – 2016-08-11 (×4): 400 mg via ORAL
  Filled 2016-08-08 (×4): qty 2

## 2016-08-08 MED ORDER — DEXTROSE 5 % IV SOLN
500.0000 mg | Freq: Every day | INTRAVENOUS | Status: DC
  Administered 2016-08-08 – 2016-08-11 (×5): 500 mg via INTRAVENOUS
  Filled 2016-08-08 (×6): qty 500

## 2016-08-08 MED ORDER — BENZTROPINE MESYLATE 0.5 MG PO TABS
0.5000 mg | ORAL_TABLET | Freq: Every day | ORAL | Status: DC
  Administered 2016-08-08 – 2016-08-12 (×5): 0.5 mg via ORAL
  Filled 2016-08-08 (×5): qty 1

## 2016-08-08 MED ORDER — IPRATROPIUM-ALBUTEROL 0.5-2.5 (3) MG/3ML IN SOLN
3.0000 mL | Freq: Three times a day (TID) | RESPIRATORY_TRACT | Status: DC
  Administered 2016-08-09 – 2016-08-10 (×6): 3 mL via RESPIRATORY_TRACT
  Filled 2016-08-08 (×6): qty 3

## 2016-08-08 MED ORDER — HEPARIN SODIUM (PORCINE) 5000 UNIT/ML IJ SOLN
5000.0000 [IU] | Freq: Three times a day (TID) | INTRAMUSCULAR | Status: DC
Start: 2016-08-08 — End: 2016-08-08
  Administered 2016-08-08: 5000 [IU] via SUBCUTANEOUS
  Filled 2016-08-08: qty 1

## 2016-08-08 MED ORDER — ACETAMINOPHEN 325 MG PO TABS: 650.0000 mg | ORAL_TABLET | Freq: Four times a day (QID) | ORAL | Status: DC | PRN

## 2016-08-08 MED ORDER — FERROUS SULFATE 325 (65 FE) MG PO TABS
325.0000 mg | ORAL_TABLET | Freq: Two times a day (BID) | ORAL | Status: DC
  Administered 2016-08-08 – 2016-08-12 (×9): 325 mg via ORAL
  Filled 2016-08-08 (×9): qty 1

## 2016-08-08 MED ORDER — ACETAMINOPHEN 650 MG RE SUPP: 650.0000 mg | Freq: Four times a day (QID) | RECTAL | Status: DC | PRN

## 2016-08-08 MED ORDER — INSULIN DETEMIR 100 UNIT/ML ~~LOC~~ SOLN
40.0000 [IU] | Freq: Every day | SUBCUTANEOUS | Status: DC
  Administered 2016-08-08: 40 [IU] via SUBCUTANEOUS
  Filled 2016-08-08: qty 0.4

## 2016-08-08 MED ORDER — IPRATROPIUM-ALBUTEROL 0.5-2.5 (3) MG/3ML IN SOLN
3.0000 mL | Freq: Four times a day (QID) | RESPIRATORY_TRACT | Status: DC
  Administered 2016-08-08: 3 mL via RESPIRATORY_TRACT
  Filled 2016-08-08: qty 3

## 2016-08-08 MED ORDER — ASPIRIN EC 81 MG PO TBEC
81.0000 mg | DELAYED_RELEASE_TABLET | Freq: Every day | ORAL | Status: DC
  Administered 2016-08-08 – 2016-08-11 (×4): 81 mg via ORAL
  Filled 2016-08-08 (×5): qty 1

## 2016-08-08 MED ORDER — HEPARIN BOLUS VIA INFUSION
3500.0000 [IU] | Freq: Once | INTRAVENOUS | Status: AC
  Administered 2016-08-08: 3500 [IU] via INTRAVENOUS
  Filled 2016-08-08: qty 3500

## 2016-08-08 MED ORDER — DEXTROSE 5 % IV SOLN
1.0000 g | Freq: Every day | INTRAVENOUS | Status: DC
  Administered 2016-08-08 – 2016-08-09 (×3): 1 g via INTRAVENOUS
  Filled 2016-08-08 (×3): qty 10

## 2016-08-08 MED ORDER — WARFARIN SODIUM 5 MG PO TABS
5.0000 mg | ORAL_TABLET | Freq: Once | ORAL | Status: AC
  Administered 2016-08-08: 5 mg via ORAL
  Filled 2016-08-08: qty 1

## 2016-08-08 NOTE — Progress Notes (Signed)
Pharmacy - IV heparin  Assessment:    Please see note from Dolly Rias, PharmD earlier today for full details.  Briefly, 66 y.o. female on warfarin for prior DVT; held perioperatively; now on heparin infusion for bridging while warfarin resumed postop   First heparin level therapeutic at 0.38 on 1100 units/hr  Plan:   Continue heparin at current rate  Recheck confirmatory level with AM labs  Reuel Boom, PharmD, BCPS Pager: [phone] 08/08/2016, 5:33 PM

## 2016-08-08 NOTE — Progress Notes (Signed)
PROGRESS NOTE        PATIENT DETAILS Name: Robin [name] Age: 66 y.o. Sex: female Date of Birth: [birth year] Admit Date: 08/07/2016 Admitting Physician Rise Patience, MD [facility]  Brief Narrative: Patient is a 66 y.o. female  with history of type 2 diabetes mellitus, stage 4 to 5 chronic kidney disease, HTN, CAD status post stent placement, chronic anemia, and COPD who presents with 1 month history of intermittent nonproductive cough. She had right foot surgery 2 days ago. She is a cigarette smoker and smokes 1 pack per day. She started experiencing shortness of breath at rest and with exertion and her daughter brought her to the ED. In the ED, she had BP of 154/72, temperature 99.2, heart rate 96, respiratory rate 16, SpO2 86% on room air. WBC normal at 6.9K. CXR showed moderate cardiomegaly, pulmonary vascular congestion, no pulmonary edema, and no focal consolidation. She was started on IV Lasix and IV antibiotics (ceftriaxone and azithromycin). Awaiting results of blood cultures and UA. Troponin was elevated at 0.13, but EKG was normal. Creatinine was elevated at 4.63 and she was found to have hypokalemia (K=2.8). Hemoglobin was normal.   Subjective: She complains of cough and shortness of breath. Cough was nonproductive prior to admission, she is now having some thick yellow sputum.   Assessment/Plan: Principal Problem:   SIRS (systemic inflammatory response syndrome) (HCC) Active Problems:   Type 2 diabetes mellitus with hyperglycemia (HCC)   Essential hypertension   Acute on chronic diastolic heart failure (HCC)   Anemia, chronic renal failure   Chronic renal disease, stage 4, severely decreased glomerular filtration rate between 15-29 mL/min/1.73 square meter (HCC)   CHF (congestive heart failure) ([place])  1. Systemic inflammatory response syndrome. Source is unclear, but likely pulmonary given cough and hypoxia. CXR showed moderate  cardiomegaly, pulmonary vascular congestion, no pulmonary edema, and no focal consolidation. Influenza A negative. Awaiting results of blood culture, but patient has been started on empiric rocephin and azithromycin. If cause of symptoms remains unclear, consider consulting Dr. Sharol Given to reassess right foot.    2. Acute on chronic diastolic heart failure. Last EF measured in July 2017 was 55-60%.  In the ED, she was given IV Lasix. Patient takes Bumex at home and this has been continued during hospitalization.   3. Hypertension. Stable, continue amlodipine, Bumex, Coreg, hydralazine.   4. Type 2 diabetes mellitus. CBGs stable. Continue home insulin with sliding scale coverage.   5. Iron deficiency anemia. Hemoglobin in the 8-9 range. Continue supplementation with ferrous sulfate.   6. Chronic kidney disease, stage 4 to 5 with hypokalemia. Creatinine of 4.63 on admission. Treated hypokalemia with oral KCl, but she remains hypokalemic. Continue to monitor metabolic panel.   7. History of CAD, status post stenting. Continue aspirin, statin, Coreg, and Imdur.   8. History of DVT. Was anticoagulated with Coumadin for 3 months. Heparin has been order for subtherapeutic INR and will be discontinued when INR is more than 2.   9. Recent right foot surgery. Performed on 3/14 for treatment of Charcot foot. Dressing in place, will need to be changed daily.   See addendum.    DVT Prophylaxis: Full dose anticoagulation with Heparin  Code Status: Full   Family Communication: Daughter at bedside, all questions and concerns addressed.   Disposition Plan: Remain inpatient-but will plan  on Home health on discharge  Antimicrobial agents: Anti-infectives    Start     Dose/Rate Route Frequency Ordered Stop   08/08/16 0330  cefTRIAXone (ROCEPHIN) 1 g in dextrose 5 % 50 mL IVPB     1 g 100 mL/hr over 30 Minutes Intravenous Daily at bedtime 08/08/16 0311 08/14/16 2159   08/08/16 0330  azithromycin  (ZITHROMAX) 500 mg in dextrose 5 % 250 mL IVPB     500 mg 250 mL/hr over 60 Minutes Intravenous Daily at bedtime 08/08/16 0311 08/14/16 2159      Procedures: None  CONSULTS:  orthopedic surgery  Time spent: 30 minutes-Greater than 50% of this time was spent in counseling, explanation of diagnosis, planning of further management, and coordination of care.  MEDICATIONS: Scheduled Meds: . amLODipine  10 mg Oral Daily  . aspirin EC  81 mg Oral Daily  . atorvastatin  40 mg Oral QHS  . azithromycin  500 mg Intravenous QHS  . benztropine  0.5 mg Oral Daily  . bumetanide  4 mg Oral BID  . carvedilol  25 mg Oral BID WC  . cefTRIAXone (ROCEPHIN)  IV  1 g Intravenous QHS  . docusate sodium  100 mg Oral BID  . ferrous sulfate  325 mg Oral BID WC  . fluPHENAZine  1 mg Oral QHS  . gabapentin  100 mg Oral QHS  . hydrALAZINE  100 mg Oral BID  . insulin aspart  0-9 Units Subcutaneous TID WC  . insulin detemir  20 Units Subcutaneous QHS  . insulin detemir  40 Units Subcutaneous Daily  . isosorbide mononitrate  60 mg Oral Daily  . pantoprazole  20 mg Oral BID  . QUEtiapine  400 mg Oral QHS  . Warfarin - Pharmacist Dosing Inpatient   Does not apply q1800   Continuous Infusions: . heparin 1,100 Units/hr (08/08/16 1240)   PRN Meds:.acetaminophen **OR** acetaminophen, albuterol, oxyCODONE-acetaminophen   PHYSICAL EXAM: Vital signs: Vitals:   08/08/16 0015 08/08/16 0123 08/08/16 0510 08/08/16 1217  BP: (!) 162/76 (!) 148/66 125/70 (!) 144/62  Pulse: 100 (!) 101 81 81  Resp: 17 18 18    Temp:  98.6 F (37 C) 100 F (37.8 C)   TempSrc:  Oral Oral   SpO2: 100% 100% 100%   Weight:  75.6 kg (166 lb 10.7 oz)    Height:       Filed Weights   08/07/16 1852 08/08/16 0123  Weight: 75.8 kg (167 lb) 75.6 kg (166 lb 10.7 oz)   Body mass index is 28.61 kg/m.   General appearance :Awake, alert, not in any distress. Speech Clear. Not toxic Looking Eyes:, pupils equally reactive to light and  accomodation,no scleral icterus.Pink conjunctiva HEENT: Atraumatic and Normocephalic Neck: supple, no JVD. Resp:Good air entry bilaterally, no added sounds  CVS: S1 S2 regular, no murmurs.  Extremities: B/L Lower Ext shows trace edema, both legs are warm to touch Neurology:  speech clear Psychiatric: Normal judgment and insight. Alert and oriented x 3. Normal mood. Musculoskeletal:No digital cyanosis. Dressing in place on right foot.  Skin:No Rash, warm and dry. Dressing in place on right foot.   I have personally reviewed following labs and imaging studies  LABORATORY DATA: CBC:  Recent Labs Lab 08/06/16 0745 08/07/16 2124 08/08/16 0457  WBC  --  6.9 6.2  6.4  NEUTROABS  --  4.7  --   HGB 9.9* 9.2* 8.4*  8.4*  HCT 29.0* 26.9* 25.2*  25.2*  MCV  --  89.7 92.0  92.3  PLT  --  174 161  371    Basic Metabolic Panel:  Recent Labs Lab 08/06/16 0745 08/07/16 2124 08/08/16 0457  NA 139 139 136  K 3.8 2.8* 2.9*  CL  --  105 102  CO2  --  24 23  GLUCOSE 43* 46* 144*  BUN  --  50* 52*  CREATININE  --  4.63* 4.78*  4.86*  CALCIUM  --  8.5* 8.3*    GFR: Estimated Creatinine Clearance: 11.6 mL/min (A) (by C-G formula based on SCr of 4.86 mg/dL (H)).  Liver Function Tests:  Recent Labs Lab 08/07/16 2124  AST 37  ALT 11*  ALKPHOS 65  BILITOT 0.5  PROT 7.1  ALBUMIN 3.1*   No results for input(s): LIPASE, AMYLASE in the last 168 hours. No results for input(s): AMMONIA in the last 168 hours.  Coagulation Profile:  Recent Labs Lab 08/07/16 2127  INR 1.16    Cardiac Enzymes:  Recent Labs Lab 08/07/16 2127 08/08/16 0457 08/08/16 0844  TROPONINI 0.13* 0.11* 0.09*    BNP (last 3 results) No results for input(s): PROBNP in the last 8760 hours.  HbA1C: No results for input(s): HGBA1C in the last 72 hours.  CBG:  Recent Labs Lab 08/06/16 0727 08/06/16 0808 08/06/16 0946 08/08/16 0847 08/08/16 1128  GLUCAP 41* 112* 74 145* 98    Lipid  Profile: No results for input(s): CHOL, HDL, LDLCALC, TRIG, CHOLHDL, LDLDIRECT in the last 72 hours.  Thyroid Function Tests: No results for input(s): TSH, T4TOTAL, FREET4, T3FREE, THYROIDAB in the last 72 hours.  Anemia Panel: No results for input(s): VITAMINB12, FOLATE, FERRITIN, TIBC, IRON, RETICCTPCT in the last 72 hours.  Urine analysis:    Component Value Date/Time   COLORURINE YELLOW 03/13/2016 1420   APPEARANCEUR CLOUDY (A) 03/13/2016 1420   LABSPEC 1.016 03/13/2016 1420   PHURINE 5.5 03/13/2016 1420   GLUCOSEU 250 (A) 03/13/2016 1420   HGBUR NEGATIVE 03/13/2016 1420   BILIRUBINUR NEGATIVE 03/13/2016 1420   KETONESUR NEGATIVE 03/13/2016 1420   PROTEINUR 100 (A) 03/13/2016 1420   UROBILINOGEN 0.2 01/21/2015 0416   NITRITE NEGATIVE 03/13/2016 1420   LEUKOCYTESUR NEGATIVE 03/13/2016 1420    Sepsis Labs: Lactic Acid, Venous    Component Value Date/Time   LATICACIDVEN 1.11 08/07/2016 2301    MICROBIOLOGY: No results found for this or any previous visit (from the past 240 hour(s)).  RADIOLOGY STUDIES/RESULTS: Dg Chest 2 View  Result Date: 08/07/2016 CLINICAL DATA:  66 year old female with shortness of breath. EXAM: CHEST  2 VIEW COMPARISON:  Chest radiograph dated 03/13/2016 FINDINGS: There is moderate cardiomegaly with mildly prominent central vasculature and mild cephalization concerning for mild vascular congestion. No pulmonary edema. There is no focal consolidation, pleural effusion, or pneumothorax. No acute osseous pathology identified. IMPRESSION: Moderate cardiomegaly with pulmonary vascular congestion. No pulmonary edema or focal consolidation. Electronically Signed   By: Anner Crete M.D.   On: 08/07/2016 19:54   Xr Foot Complete Left  Result Date: 07/16/2016 Two-view radiographs of the left foot shows no active Charcot arthropathy. Her foot is congruent no evidence of stress fractures.  Xr Ankle 2 Views Left  Result Date: 07/16/2016 2 view radiographs  left ankle shows stable tibial calcaneal fusion. She does have some prominence radiographically of her old internal fixation from her medial malleolar fracture.  Xr Foot Complete Right  Result Date: 07/16/2016 Three-view radiographs of the right foot shows Charcot collapse through the Lisfranc joint. There is arthritic  changes from the base of the first metatarsal medial cuneiform and displacement of the second metatarsal to the middle cuneiform. There is a large bony spur dorsally over the base of the second metatarsal.    LOS: 1 day   Kyra Leyland, PA-S

## 2016-08-08 NOTE — H&P (Signed)
History and Physical    Robin [name] TWS:[id number] DOB: 1950-08-28 DOA: 08/07/2016  PCP: [facility]  Patient coming from: Home.  Chief Complaint: Cough and shortness of breath.  HPI: Robin [name] is a 66 y.o. female with history of diabetes mellitus type 2, chronic kidney disease stage IV to 5, hypertension, CAD status post stenting, chronic anemia, hypertension was brought to the ER after patient was having persistent cough with shortness of breath. Patient has had a surgery for the right foot by Dr. Sharol Given 2 days ago. Patient's symptoms have been present for last 3 days even prior to the surgery. Denies any chest pain. Cough has been nonproductive with patient short of breath even at rest increases on exertion. Patient's daughter noticed that patient also was getting diaphoretic. As per the patient's daughter patient has not gained weight and has actually lost weight. Patient's Coumadin which patient takes for DVT has been held for the surgery.   ED Course: In the ER patient was found to be hypoxic and febrile. Chest x-ray shows congestion. Blood cultures were obtained and patient being admitted for possible pneumonia versus CHF. Lasix was given in the ER.  Review of Systems: As per HPI, rest all negative.   Past Medical History:  Diagnosis Date  . Anemia   . Anxiety   . Arthritis   . Benign hypertension   . Bipolar disorder ([place])   . CHF (congestive heart failure) ([place])   . Chronic kidney disease, stage V [name])    Nephrologist is with [facility] (Dr. Tammi Klippel)  . Coronary artery disease   . Depression   . Diabetes mellitus without complication ([place])   . DVT (deep venous thrombosis) (HCC)    right lower leg  . GERD (gastroesophageal reflux disease)   . Heart murmur   . History of blood transfusion   . History of bronchitis   . History of pneumonia   . Shortness of breath dyspnea   . Sleep apnea   . Type 2 diabetes mellitus ([place])     Past Surgical History:    Procedure Laterality Date  . ABDOMINAL HYSTERECTOMY    . ANKLE CLOSED REDUCTION N/A 11/17/2014   Procedure: CLOSED REDUCTION ANKLE;  Surgeon: Earlie Server, MD;  Location: [place];  Service: Orthopedics;  Laterality: N/A;  . ANKLE FUSION Left 03/16/2015   Procedure: Left Tibiocalcaneal Fusion;  Surgeon: Newt Minion, MD;  Location: [place];  Service: Orthopedics;  Laterality: Left;  . APPLICATION OF WOUND VAC Right 08/06/2016   Procedure: APPLICATION OF PREVENA WOUND VAC;  Surgeon: Newt Minion, MD;  Location: [place];  Service: Orthopedics;  Laterality: Right;  . AV FISTULA PLACEMENT Left [id number]   done at [place]     2 stent   . CHOLECYSTECTOMY    . CORONARY STENT PLACEMENT    . FOOT ARTHRODESIS Right 08/06/2016   Procedure: Right Foot Fusion Lisfranc Joint;  Surgeon: Newt Minion, MD;  Location: [place];  Service: Orthopedics;  Laterality: Right;  . HARDWARE REMOVAL Left 03/16/2015   Procedure: Removal Hardware Left Ankle;  Surgeon: Newt Minion, MD;  Location: [place];  Service: Orthopedics;  Laterality: Left;  . ORIF ANKLE FRACTURE Left 11/20/2014   Procedure: OPEN REDUCTION INTERNAL FIXATION (ORIF) ANKLE FRACTURE;  Surgeon: Renette Butters, MD;  Location: [place];  Service: Orthopedics;  Laterality: Left;  . TONSILLECTOMY       reports that she has been  smoking Cigarettes.  She has been smoking about 1.00 pack per day. She has never used smokeless tobacco. She reports that she does not drink alcohol or use drugs.  No Known Allergies  Family History  Problem Relation Age of Onset  . Family history unknown: Yes    Prior to Admission medications   Medication Sig Start Date End Date Taking? Authorizing Provider  albuterol (PROVENTIL HFA;VENTOLIN HFA) 108 (90 BASE) MCG/ACT inhaler Inhale 1-2 puffs into the lungs every 6 (six) hours as needed for wheezing or shortness of breath.   Yes Historical Provider, MD  amLODipine (NORVASC) 10 MG tablet Take 10 mg by  mouth daily.    Yes Historical Provider, MD  aspirin EC 81 MG tablet Take 81 mg by mouth daily.   Yes Historical Provider, MD  atorvastatin (LIPITOR) 80 MG tablet Take 40 mg by mouth at bedtime.    Yes Historical Provider, MD  benztropine (COGENTIN) 0.5 MG tablet Take 0.5 mg by mouth daily.    Yes Historical Provider, MD  bumetanide (BUMEX) 2 MG tablet Take 4 mg by mouth 2 (two) times daily.    Yes Historical Provider, MD  carvedilol (COREG) 25 MG tablet Take 25 mg by mouth 2 (two) times daily with a meal.   Yes Historical Provider, MD  darbepoetin (ARANESP) 40 MCG/0.4ML SOLN injection Inject 40 mcg into the skin every 30 (thirty) days.    Yes Historical Provider, MD  docusate sodium (COLACE) 100 MG capsule Take 100 mg by mouth 2 (two) times daily.    Yes Historical Provider, MD  ferrous sulfate 325 (65 FE) MG tablet Take 325 mg by mouth 2 (two) times daily with a meal.   Yes Historical Provider, MD  fluPHENAZine (PROLIXIN) 1 MG tablet Take 1 mg by mouth at bedtime.    Yes Historical Provider, MD  gabapentin (NEURONTIN) 100 MG capsule Take 100 mg by mouth at bedtime.   Yes Historical Provider, MD  glucagon (GLUCAGON EMERGENCY) 1 MG injection Inject 1 mg into the muscle once as needed (for low blood sugar).    Yes Historical Provider, MD  hydrALAZINE (APRESOLINE) 100 MG tablet Take 100 mg by mouth 2 (two) times daily.   Yes Historical Provider, MD  insulin aspart (NOVOLOG) 100 UNIT/ML injection Inject 0-15 Units into the skin 3 (three) times daily before meals. Pt uses per sliding scale:  Less than 150:  0 units, 151-250:  5-8 units, 251-350:  8-10 units, greater than 350:  15 units   Yes Historical Provider, MD  insulin detemir (LEVEMIR) 100 UNIT/ML injection Inject 0.12 mLs (12 Units total) into the skin at bedtime. Patient taking differently: Inject 20-40 Units into the skin 2 (two) times daily. Pt uses 40 units in the morning and 20 units at bedtime. 11/22/14  Yes Bonnielee Haff, MD  isosorbide  mononitrate (IMDUR) 60 MG 24 hr tablet Take 60 mg by mouth daily.    Yes Historical Provider, MD  Melatonin 5 MG TABS Take 5 mg by mouth at bedtime.   Yes Historical Provider, MD  oxyCODONE-acetaminophen (ROXICET) 5-325 MG tablet Take 1 tablet by mouth every 4 (four) hours as needed for severe pain. Patient taking differently: Take 0.5-1 tablets by mouth every 4 (four) hours as needed for severe pain.  08/06/16  Yes Newt Minion, MD  pantoprazole (PROTONIX) 20 MG tablet Take 20 mg by mouth 2 (two) times daily.    Yes Historical Provider, MD  QUEtiapine (SEROQUEL) 400 MG tablet Take 400 mg  by mouth at bedtime.   Yes Historical Provider, MD  warfarin (COUMADIN) 5 MG tablet Take 1 tablet (5 mg total) by mouth daily. Take 5mg  every day. Patient not taking: Reported on 08/07/2016 03/17/16   Bonnielee Haff, MD    Physical Exam: Vitals:   08/07/16 2230 08/08/16 0014 08/08/16 0015 08/08/16 0123  BP: (!) 141/81 (!) 141/86 (!) 162/76 (!) 148/66  Pulse:  (!) 104 100 (!) 101  Resp: 20 20 17 18   Temp:      TempSrc:      SpO2:  100% 100% 100%  Weight:    75.6 kg (166 lb 10.7 oz)  Height:          Constitutional: Moderately built and nourished. Vitals:   08/07/16 2230 08/08/16 0014 08/08/16 0015 08/08/16 0123  BP: (!) 141/81 (!) 141/86 (!) 162/76 (!) 148/66  Pulse:  (!) 104 100 (!) 101  Resp: 20 20 17 18   Temp:      TempSrc:      SpO2:  100% 100% 100%  Weight:    75.6 kg (166 lb 10.7 oz)  Height:       Eyes: Anicteric no pallor. ENMT: No discharge from the ears eyes nose or mouth. Neck: No mass felt. No JVD appreciated. Respiratory: No rhonchi or crepitations. Cardiovascular: S1-S2 heard no murmurs appreciated. Abdomen: Soft nontender bowel sounds present. Musculoskeletal: Right leg is in the dressing. Skin: Right leg is in dressing. Neurologic: Alert awake oriented to time place and person. Moves all extremities. Psychiatric: Appears normal. Normal affect.   Labs on Admission: I  have personally reviewed following labs and imaging studies  CBC:  Recent Labs Lab 08/06/16 0745 08/07/16 2124  WBC  --  6.9  NEUTROABS  --  4.7  HGB 9.9* 9.2*  HCT 29.0* 26.9*  MCV  --  89.7  PLT  --  124   Basic Metabolic Panel:  Recent Labs Lab 08/06/16 0745 08/07/16 2124  NA 139 139  K 3.8 2.8*  CL  --  105  CO2  --  24  GLUCOSE 43* 46*  BUN  --  50*  CREATININE  --  4.63*  CALCIUM  --  8.5*   GFR: Estimated Creatinine Clearance: 12.2 mL/min (A) (by C-G formula based on SCr of 4.63 mg/dL (H)). Liver Function Tests:  Recent Labs Lab 08/07/16 2124  AST 37  ALT 11*  ALKPHOS 65  BILITOT 0.5  PROT 7.1  ALBUMIN 3.1*   No results for input(s): LIPASE, AMYLASE in the last 168 hours. No results for input(s): AMMONIA in the last 168 hours. Coagulation Profile:  Recent Labs Lab 08/07/16 2127  INR 1.16   Cardiac Enzymes:  Recent Labs Lab 08/07/16 2127  TROPONINI 0.13*   BNP (last 3 results) No results for input(s): PROBNP in the last 8760 hours. HbA1C: No results for input(s): HGBA1C in the last 72 hours. CBG:  Recent Labs Lab 08/06/16 0727 08/06/16 0808 08/06/16 0946  GLUCAP 41* 112* 74   Lipid Profile: No results for input(s): CHOL, HDL, LDLCALC, TRIG, CHOLHDL, LDLDIRECT in the last 72 hours. Thyroid Function Tests: No results for input(s): TSH, T4TOTAL, FREET4, T3FREE, THYROIDAB in the last 72 hours. Anemia Panel: No results for input(s): VITAMINB12, FOLATE, FERRITIN, TIBC, IRON, RETICCTPCT in the last 72 hours. Urine analysis:    Component Value Date/Time   COLORURINE YELLOW 03/13/2016 1420   APPEARANCEUR CLOUDY (A) 03/13/2016 1420   LABSPEC 1.016 03/13/2016 1420   PHURINE 5.5 03/13/2016 1420  GLUCOSEU 250 (A) 03/13/2016 1420   HGBUR NEGATIVE 03/13/2016 1420   BILIRUBINUR NEGATIVE 03/13/2016 1420   KETONESUR NEGATIVE 03/13/2016 1420   PROTEINUR 100 (A) 03/13/2016 1420   UROBILINOGEN 0.2 01/21/2015 0416   NITRITE NEGATIVE  03/13/2016 1420   LEUKOCYTESUR NEGATIVE 03/13/2016 1420   Sepsis Labs: @LABRCNTIP (procalcitonin:4,lacticidven:4) )No results found for this or any previous visit (from the past 240 hour(s)).   Radiological Exams on Admission: Dg Chest 2 View  Result Date: 08/07/2016 CLINICAL DATA:  66 year old female with shortness of breath. EXAM: CHEST  2 VIEW COMPARISON:  Chest radiograph dated 03/13/2016 FINDINGS: There is moderate cardiomegaly with mildly prominent central vasculature and mild cephalization concerning for mild vascular congestion. No pulmonary edema. There is no focal consolidation, pleural effusion, or pneumothorax. No acute osseous pathology identified. IMPRESSION: Moderate cardiomegaly with pulmonary vascular congestion. No pulmonary edema or focal consolidation. Electronically Signed   By: Anner Crete M.D.   On: 08/07/2016 19:54     Assessment/Plan Principal Problem:   SIRS (systemic inflammatory response syndrome) (HCC) Active Problems:   Type 2 diabetes mellitus with hyperglycemia (HCC)   Essential hypertension   Acute on chronic diastolic heart failure (HCC)   Anemia, chronic renal failure   Chronic renal disease, stage 4, severely decreased glomerular filtration rate between 15-29 mL/min/1.73 square meter (HCC)   CHF (congestive heart failure) ([place])    1. SIRS - source not clear likely pulmonary given the hypoxia and cough. Check influenza PCR for blood cultures and I have placed patient on empiric antibiotics at this time. If fever persists may discuss with Dr. Sharol Given to reassess the right foot which patient had surgery 2 days ago. 2. Chronic combined systolic and diastolic CHF - last EF measured in July 2017 was 55-60% - patient did receive Lasix in the ER. I have continued patient's home dose of Bumex. 3. History of DVT on Coumadin which was held recently for surgery - INR is subtherapeutic I have ordered heparin and Coumadin. Heparin to be discontinued once INR more  than 2. 4. History of CAD status post stenting on aspirin statins Coreg and Imdur. Denies any chest pain. 5. History of bipolar disorder. Will continue home medications. 6. Chronic kidney disease stage IV to 5 with hypokalemia - follow metabolic panel. 7. Iron deficiency anemia on iron supplements. 8. Diabetes mellitus type 2 - continue home dose of insulin with sliding scale coverage. 9. Recent right foot surgery - see #1.   DVT prophylaxis: Heparin and Coumadin. Code Status: Full code.  Family Communication: Patient's daughter.  Disposition Plan: Home.  Consults called: None.  Admission status: Inpatient.    Rise Patience MD [facility] Pager [phone].  If 7PM-7AM, please contact night-coverage [email or web address] Password [email or web address]  08/08/2016, 3:12 AM

## 2016-08-08 NOTE — Progress Notes (Signed)
ANTICOAGULATION CONSULT NOTE - Initial Consult  Pharmacy Consult for heparin and warfarin Indication: VTE treatment  No Known Allergies  Patient Measurements: Height: 5\' 4"  (162.6 cm) Weight: 166 lb 10.7 oz (75.6 kg) IBW/kg (Calculated) : 54.7 Heparin Dosing Weight: 70.5k  Vital Signs: Temp: 100 F (37.8 C) (03/16 0510) Temp Source: Oral (03/16 0510) BP: 125/70 (03/16 0510) Pulse Rate: 81 (03/16 0510)  Labs:  Recent Labs  08/06/16 0745 08/07/16 2124 08/07/16 2127 08/08/16 0457  HGB 9.9* 9.2*  --  8.4*  8.4*  HCT 29.0* 26.9*  --  25.2*  25.2*  PLT  --  174  --  161  165  LABPROT  --   --  14.8  --   INR  --   --  1.16  --   CREATININE  --  4.63*  --  4.78*  4.86*  TROPONINI  --   --  0.13* 0.11*    Estimated Creatinine Clearance: 11.6 mL/min (A) (by C-G formula based on SCr of 4.86 mg/dL (H)).   Medical History: Past Medical History:  Diagnosis Date  . Anemia   . Anxiety   . Arthritis   . Benign hypertension   . Bipolar disorder ([place])   . CHF (congestive heart failure) ([place])   . Chronic kidney disease, stage V [facility])    Nephrologist is with [facility] (Dr. Tammi Klippel)  . Coronary artery disease   . Depression   . Diabetes mellitus without complication ([place])   . DVT (deep venous thrombosis) (HCC)    right lower leg  . GERD (gastroesophageal reflux disease)   . Heart murmur   . History of blood transfusion   . History of bronchitis   . History of pneumonia   . Shortness of breath dyspnea   . Sleep apnea   . Type 2 diabetes mellitus (HCC)       Assessment: 66 y.o. female with history of diabetes mellitus type 2, chronic kidney disease stage IV to 5, hypertension, CAD status post stenting, chronic anemia, hypertension was brought to the ER after patient was having persistent cough with shortness of breath. Pt has had a surgery for the right foot by Dr. Sharol Given 2 days ago. Patient's symptoms have been present for last 3 days even prior to the surgery.  Denies any chest pain. Cough has been nonproductive with patient short of breath even at rest increases on exertion. Patient's Coumadin which patient takes for DVT has been held for the surgery. Home dose warfarin 5mg  daily  08/08/2016 INR 1.16 Scr 4.86, CrCl ~ 27mls/min Baseline heparin level and PTT ordered DI: azithromycin may increase the effects of warfarin  Goal of Therapy:  Heparin level 0.3-0.7 units/ml  INR 2-3 Monitor platelets by anticoagulation protocol   Plan:   Heparin bolus 3500 units x 1 then start heparin drip at 1100 units/hr  Heparin level in 8 hours  Coumadin 5mg  po x 1  D/c heparin once INR > 2  Daily INR/heparin level/CBC   Dolly Rias RPh 08/08/2016, 8:28 AM Pager [phone]

## 2016-08-08 NOTE — Progress Notes (Signed)
PROGRESS NOTE    Robin [name]  EUM:[id number] DOB: [birth year] DOA: 08/07/2016 PCP: [place]     Brief Narrative:  66 yo female with past medical history of t2dm, stage iv ckd and htn. Presented with worsening dyspnea for the last 3 days. Associated with cough. On the examination, noted to be hypertensive, tachycardic with oxygen saturation 100 % on supplemental 02 per [place] and 87% on room air. Chest film with bilateral interstitial infiltrates. Admitted with the working diagnosis of possible pneumonia or volume overload.     Assessment & Plan:   Principal Problem:   SIRS (systemic inflammatory response syndrome) (HCC) Active Problems:   Type 2 diabetes mellitus with hyperglycemia (HCC)   Essential hypertension   Acute on chronic diastolic heart failure (HCC)   Anemia, chronic renal failure   Chronic renal disease, stage 4, severely decreased glomerular filtration rate between 15-29 mL/min/1.73 square meter (HCC)   CHF (congestive heart failure) ([place])  1. Hypoxic respiratory failure. Will continue antibiotic therapy for now, will follow on chest film after diuresis, will continue oxymetry monitoring and supplemental 02 per [place] to target 02 saturation 92%. Last echocardiography from 11/2015 with preserved lv function. Check procalcitonin.   2. CKD stage 4. Will continue bumex for diuresis, keep negative fluid balance, will check strict in and out, monitor urine output. K at 2,9, will give 40 kcl po and follow renal panel in am, avoid hypotension and nephrotoxic medications.   3. DVT. Will continue anticoagulation with IV heparin and warfarin, target INR 2 to 3. No signs of bleeding.   4. CAD. Continue asa, no chest pain.   5. T2DM. Will continue glucose cover and monitoring. Patient tolerating po well.   DVT prophylaxis: heparin  Code Status: fill Family Communication: no family at the bedside  Disposition Plan: home    Consultants:     Procedures:     Antimicrobials:    Ceftriaxone   Azithromycin    Subjective: Patient with persistent dyspnea, no chest pain, no nausea or vomiting. Somnolent at the time of my examination.   Objective: Vitals:   08/08/16 0015 08/08/16 0123 08/08/16 0510 08/08/16 1217  BP: (!) 162/76 (!) 148/66 125/70 (!) 144/62  Pulse: 100 (!) 101 81 81  Resp: 17 18 18    Temp:  98.6 F (37 C) 100 F (37.8 C) 98.7 F (37.1 C)  TempSrc:  Oral Oral   SpO2: 100% 100% 100% 100%  Weight:  75.6 kg (166 lb 10.7 oz)    Height:        Intake/Output Summary (Last 24 hours) at 08/08/16 1634 Last data filed at 08/08/16 0600  Gross per 24 hour  Intake              300 ml  Output                0 ml  Net              300 ml   Filed Weights   08/07/16 1852 08/08/16 0123  Weight: 75.8 kg (167 lb) 75.6 kg (166 lb 10.7 oz)    Examination:  General exam: somnolent and deconditioned E ENT: mild pallor, oral mucosa dry.  Respiratory system: Respiratory effort normal. Mild expiratory wheezing, no rales or rhonchi.  Cardiovascular system: S1 & S2 heard, RRR. No JVD, murmurs, rubs, gallops or clicks. Trace pedal edema. Gastrointestinal system: Abdomen is nondistended, soft and nontender. No organomegaly or masses felt. Normal bowel sounds heard.  Central nervous system: Alert and oriented. No focal neurological deficits. Extremities: Symmetric 5 x 5 power. Skin: No rashes, lesions or ulcers. Left foot with surgical wound with gauze in place, mild bleeding but no erythema or significant edema, no increase local temperature.      Data Reviewed: I have personally reviewed following labs and imaging studies  CBC:  Recent Labs Lab 08/06/16 0745 08/07/16 2124 08/08/16 0457  WBC  --  6.9 6.2  6.4  NEUTROABS  --  4.7  --   HGB 9.9* 9.2* 8.4*  8.4*  HCT 29.0* 26.9* 25.2*  25.2*  MCV  --  89.7 92.0  92.3  PLT  --  174 161  093   Basic Metabolic Panel:  Recent Labs Lab 08/06/16 0745 08/07/16 2124  08/08/16 0457  NA 139 139 136  K 3.8 2.8* 2.9*  CL  --  105 102  CO2  --  24 23  GLUCOSE 43* 46* 144*  BUN  --  50* 52*  CREATININE  --  4.63* 4.78*  4.86*  CALCIUM  --  8.5* 8.3*   GFR: Estimated Creatinine Clearance: 11.6 mL/min (A) (by C-G formula based on SCr of 4.86 mg/dL (H)). Liver Function Tests:  Recent Labs Lab 08/07/16 2124  AST 37  ALT 11*  ALKPHOS 65  BILITOT 0.5  PROT 7.1  ALBUMIN 3.1*   No results for input(s): LIPASE, AMYLASE in the last 168 hours. No results for input(s): AMMONIA in the last 168 hours. Coagulation Profile:  Recent Labs Lab 08/07/16 2127  INR 1.16   Cardiac Enzymes:  Recent Labs Lab 08/07/16 2127 08/08/16 0457 08/08/16 0844  TROPONINI 0.13* 0.11* 0.09*   BNP (last 3 results) No results for input(s): PROBNP in the last 8760 hours. HbA1C: No results for input(s): HGBA1C in the last 72 hours. CBG:  Recent Labs Lab 08/06/16 0727 08/06/16 0808 08/06/16 0946 08/08/16 0847 08/08/16 1128  GLUCAP 41* 112* 74 145* 98   Lipid Profile: No results for input(s): CHOL, HDL, LDLCALC, TRIG, CHOLHDL, LDLDIRECT in the last 72 hours. Thyroid Function Tests: No results for input(s): TSH, T4TOTAL, FREET4, T3FREE, THYROIDAB in the last 72 hours. Anemia Panel: No results for input(s): VITAMINB12, FOLATE, FERRITIN, TIBC, IRON, RETICCTPCT in the last 72 hours. Sepsis Labs:  Recent Labs Lab 08/07/16 2138 08/07/16 2301  LATICACIDVEN 0.61 1.11    No results found for this or any previous visit (from the past 240 hour(s)).       Radiology Studies: Dg Chest 2 View  Result Date: 08/07/2016 CLINICAL DATA:  66 year old female with shortness of breath. EXAM: CHEST  2 VIEW COMPARISON:  Chest radiograph dated 03/13/2016 FINDINGS: There is moderate cardiomegaly with mildly prominent central vasculature and mild cephalization concerning for mild vascular congestion. No pulmonary edema. There is no focal consolidation, pleural effusion, or  pneumothorax. No acute osseous pathology identified. IMPRESSION: Moderate cardiomegaly with pulmonary vascular congestion. No pulmonary edema or focal consolidation. Electronically Signed   By: Anner Crete M.D.   On: 08/07/2016 19:54        Scheduled Meds: . amLODipine  10 mg Oral Daily  . aspirin EC  81 mg Oral Daily  . atorvastatin  40 mg Oral QHS  . azithromycin  500 mg Intravenous QHS  . benztropine  0.5 mg Oral Daily  . bumetanide  4 mg Oral BID  . carvedilol  25 mg Oral BID WC  . cefTRIAXone (ROCEPHIN)  IV  1 g Intravenous QHS  . docusate  sodium  100 mg Oral BID  . ferrous sulfate  325 mg Oral BID WC  . fluPHENAZine  1 mg Oral QHS  . gabapentin  100 mg Oral QHS  . hydrALAZINE  100 mg Oral BID  . insulin aspart  0-9 Units Subcutaneous TID WC  . insulin detemir  20 Units Subcutaneous QHS  . insulin detemir  40 Units Subcutaneous Daily  . ipratropium-albuterol  3 mL Nebulization Q6H  . isosorbide mononitrate  60 mg Oral Daily  . pantoprazole  20 mg Oral BID  . potassium chloride SA  40 mEq Oral Once  . QUEtiapine  400 mg Oral QHS  . Warfarin - Pharmacist Dosing Inpatient   Does not apply q1800   Continuous Infusions: . heparin 1,100 Units/hr (08/08/16 1240)     LOS: 1 day      Tahjae Durr Gerome Apley, MD [facility] Pager [phone]  If 7PM-7AM, please contact night-coverage [email or web address] Password [email or web address] 08/08/2016, 4:34 PM

## 2016-08-08 NOTE — Progress Notes (Signed)
Inpatient Diabetes Program Recommendations  AACE/ADA: New Consensus Statement on Inpatient Glycemic Control (2015)  Target Ranges:  Prepandial:   less than 140 mg/dL      Peak postprandial:   less than 180 mg/dL (1-2 hours)      Critically ill patients:  140 - 180 mg/dL   Lab Results  Component Value Date   GLUCAP 98 08/08/2016   HGBA1C 8.3 (H) 07/30/2016    Review of Glycemic Control  Diabetes history: DM2 Outpatient Diabetes medications: Levemir 40 units in am and 20 units QHS, Novolog 0-15 units tidwc Current orders for Inpatient glycemic control: Lantus 40 units in am and 20 units QHS, Novolog 0-9 units tidwc  Hypoglycemia last night. Hx Stage IV-V CKD. May need less insulin on board to prevent hypos.  Inpatient Diabetes Program Recommendations:    Decrease Levemir to 20 units in am and 10 units QHS. Would begin this order on 3/17, and give no HS Levemir tonight. (Has 40 units on board)  Reaccess home meds prior to d/c.  Thank you. Lorenda Peck, RD, LDN, CDE Inpatient Diabetes Coordinator [phone]

## 2016-08-09 ENCOUNTER — Inpatient Hospital Stay (HOSPITAL_COMMUNITY): Payer: Non-veteran care

## 2016-08-09 DIAGNOSIS — I10 Essential (primary) hypertension: Secondary | ICD-10-CM

## 2016-08-09 DIAGNOSIS — R0600 Dyspnea, unspecified: Secondary | ICD-10-CM

## 2016-08-09 DIAGNOSIS — I509 Heart failure, unspecified: Secondary | ICD-10-CM

## 2016-08-09 DIAGNOSIS — N184 Chronic kidney disease, stage 4 (severe): Secondary | ICD-10-CM

## 2016-08-09 DIAGNOSIS — J810 Acute pulmonary edema: Secondary | ICD-10-CM

## 2016-08-09 LAB — GLUCOSE, CAPILLARY
GLUCOSE-CAPILLARY: 58 mg/dL — AB (ref 65–99)
GLUCOSE-CAPILLARY: 34 mg/dL — AB (ref 65–99)
GLUCOSE-CAPILLARY: 158 mg/dL — AB (ref 65–99)
GLUCOSE-CAPILLARY: 166 mg/dL — AB (ref 65–99)
Glucose-Capillary: 130 mg/dL — ABNORMAL HIGH (ref 65–99)
Glucose-Capillary: 71 mg/dL (ref 65–99)
Glucose-Capillary: 178 mg/dL — ABNORMAL HIGH (ref 65–99)

## 2016-08-09 LAB — BASIC METABOLIC PANEL
Anion gap: 11 (ref 5–15)
BUN: 55 mg/dL — AB (ref 6–20)
CO2: 23 mmol/L (ref 22–32)
Calcium: 8.7 mg/dL — ABNORMAL LOW (ref 8.9–10.3)
Chloride: 102 mmol/L (ref 101–111)
Creatinine, Ser: 4.97 mg/dL — ABNORMAL HIGH (ref 0.44–1.00)
GFR, EST AFRICAN AMERICAN: 10 mL/min — AB (ref >60)
GFR, EST NON AFRICAN AMERICAN: 8 mL/min — AB (ref >60)
Glucose, Bld: 48 mg/dL — ABNORMAL LOW (ref 65–99)
POTASSIUM: 4 mmol/L (ref 3.5–5.1)
SODIUM: 136 mmol/L (ref 135–145)

## 2016-08-09 LAB — ECHOCARDIOGRAM COMPLETE
Height: 64 in
Weight: 2589.08 oz

## 2016-08-09 LAB — CBC
HCT: 24.8 % — ABNORMAL LOW (ref 36.0–46.0)
Hemoglobin: 8.1 g/dL — ABNORMAL LOW (ref 12.0–15.0)
MCH: 29.5 pg (ref 26.0–34.0)
MCHC: 32.7 g/dL (ref 30.0–36.0)
MCV: 90.2 fL (ref 78.0–100.0)
PLATELETS: 146 10*3/uL — AB (ref 150–400)
RBC: 2.75 MIL/uL — AB (ref 3.87–5.11)
RDW: 15.6 % — ABNORMAL HIGH (ref 11.5–15.5)
WBC: 5.1 10*3/uL (ref 4.0–10.5)

## 2016-08-09 LAB — PROTIME-INR
INR: 1.4
Prothrombin Time: 17.3 seconds — ABNORMAL HIGH (ref 11.4–15.2)

## 2016-08-09 LAB — HIV ANTIBODY (ROUTINE TESTING W REFLEX): HIV Screen 4th Generation wRfx: NONREACTIVE

## 2016-08-09 LAB — PROCALCITONIN: PROCALCITONIN: 1.22 ng/mL

## 2016-08-09 LAB — HEPARIN LEVEL (UNFRACTIONATED): HEPARIN UNFRACTIONATED: 0.43 [IU]/mL (ref 0.30–0.70)

## 2016-08-09 MED ORDER — FUROSEMIDE 10 MG/ML IJ SOLN
60.0000 mg | Freq: Once | INTRAMUSCULAR | Status: AC
  Administered 2016-08-09: 60 mg via INTRAVENOUS
  Filled 2016-08-09: qty 6

## 2016-08-09 MED ORDER — WARFARIN SODIUM 5 MG PO TABS
5.0000 mg | ORAL_TABLET | Freq: Once | ORAL | Status: AC
  Administered 2016-08-09: 5 mg via ORAL
  Filled 2016-08-09: qty 1

## 2016-08-09 NOTE — Progress Notes (Addendum)
PROGRESS NOTE    Robin [name]  FKC:[id number] DOB: Apr 26, 1951 DOA: 08/07/2016 PCP: [place]    Brief Narrative:  66 yo female with past medical history of t2dm, stage iv ckd and htn. Presented with worsening dyspnea for the last 3 days. Associated with cough. On the examination, noted to be hypertensive, tachycardic with oxygen saturation 100 % on supplemental 02 per [place] and 87% on room air. Chest film with bilateral interstitial infiltrates. Admitted with the working diagnosis of possible pneumonia or volume overload.     Assessment & Plan:   Principal Problem:   SIRS (systemic inflammatory response syndrome) (HCC) Active Problems:   Type 2 diabetes mellitus with hyperglycemia (HCC)   Essential hypertension   Acute on chronic diastolic heart failure (HCC)   Anemia, chronic renal failure   Chronic renal disease, stage 4, severely decreased glomerular filtration rate between 15-29 mL/min/1.73 square meter (HCC)   CHF (congestive heart failure) ([place])   1. Hypoxic respiratory failure. Follow chest film personally reviewed, noted improvement on interstitial infiltrates, continue oxymetry monitoring and supplemental 02 per [place] to target 02 saturation 92%. No defined pneumonic infiltrate, will check procalcitonin, if negative will dc antibiotic therapy. Continue bronchodilator therapy.   2. CKD stage 4. Will give extra dose of furosemide 60 mg IV x1 and continue bumex for diuresis, keep negative fluid balance. K corrected at 4,0 and serum bicarbonate stable at 23. Check phosphate. Follow urine output.   3. DVT. Anticoagulation with IV heparin and warfarin, target INR 2 to 3. No signs of bleeding. INR 1.4 today, warfarin per pharmacy protocol.   4. CAD. No angina, continue antiplatelet therapy. Follow on echocardiography. EKG personally reviewed noted left axis deviation with left atrial enlargement.   5. T2DM. Will continue glucose cover and monitoring with sliding scale,  will hold on long acting insulin due to significant reduction in gfr and risk for hypoglycemia. Patient tolerating po well. Capillary glucose 34, 72, 158  6. HTN. Continue amlodipine, carvedilol, hydralazine and isosorbide  7. Depression. Continue seroquel.   8. Diastolic heart failure, acute on chronic. Decompensation, likely due to worsening renal function. Continue after load reduction with hydralazine - isosorbide and b blockade with coreg.   DVT prophylaxis: heparin  Code Status: fill Family Communication: no family at the bedside  Disposition Plan: home   Consultants:     Procedures:     Antimicrobials:   Ceftriaxone  azithromycin    Subjective: Patient with improved dyspnea, but still present, no cough or chest pain. Positive tobacco abuse.   Objective: Vitals:   08/08/16 1217 08/08/16 2131 08/09/16 0522 08/09/16 0900  BP: (!) 144/62 (!) 121/45 129/63   Pulse: 81 79 77   Resp:  20 18   Temp: 98.7 F (37.1 C) 99.7 F (37.6 C) 98.4 F (36.9 C)   TempSrc:  Oral Oral   SpO2: 100% 97% 98% 98%  Weight:   73.4 kg (161 lb 13.1 oz)   Height:        Intake/Output Summary (Last 24 hours) at 08/09/16 1018 Last data filed at 08/09/16 0900  Gross per 24 hour  Intake          1559.67 ml  Output                0 ml  Net          1559.67 ml   Filed Weights   08/07/16 1852 08/08/16 0123 08/09/16 0522  Weight: 75.8 kg (167  lb) 75.6 kg (166 lb 10.7 oz) 73.4 kg (161 lb 13.1 oz)    Examination:  General exam: deconditioned E ENT. Mild pallor, no icterus, oral mucosa dry.   Respiratory system: Mild expiratory wheezing, no rales or rhonchi. Mild decreased breath sounds at bases.  Cardiovascular system: S1 & S2 heard, RRR. No JVD, murmurs, rubs, gallops or clicks. Right leg non-pitting ++ edema. Gastrointestinal system: Abdomen is nondistended, soft and nontender. No organomegaly or masses felt. Normal bowel sounds heard. Central nervous system: Somnolent, but  oriented. No focal neurological deficits. Extremities: Symmetric 5 x 5 power. Skin: No rashes, lesions or ulcers     Data Reviewed: I have personally reviewed following labs and imaging studies  CBC:  Recent Labs Lab 08/06/16 0745 08/07/16 2124 08/08/16 0457 08/09/16 0524  WBC  --  6.9 6.2  6.4 5.1  NEUTROABS  --  4.7  --   --   HGB 9.9* 9.2* 8.4*  8.4* 8.1*  HCT 29.0* 26.9* 25.2*  25.2* 24.8*  MCV  --  89.7 92.0  92.3 90.2  PLT  --  174 161  165 536*   Basic Metabolic Panel:  Recent Labs Lab 08/06/16 0745 08/07/16 2124 08/08/16 0457 08/09/16 0524  NA 139 139 136 136  K 3.8 2.8* 2.9* 4.0  CL  --  105 102 102  CO2  --  24 23 23   GLUCOSE 43* 46* 144* 48*  BUN  --  50* 52* 55*  CREATININE  --  4.63* 4.78*  4.86* 4.97*  CALCIUM  --  8.5* 8.3* 8.7*   GFR: Estimated Creatinine Clearance: 11.2 mL/min (A) (by C-G formula based on SCr of 4.97 mg/dL (H)). Liver Function Tests:  Recent Labs Lab 08/07/16 2124  AST 37  ALT 11*  ALKPHOS 65  BILITOT 0.5  PROT 7.1  ALBUMIN 3.1*   No results for input(s): LIPASE, AMYLASE in the last 168 hours. No results for input(s): AMMONIA in the last 168 hours. Coagulation Profile:  Recent Labs Lab 08/07/16 2127 08/09/16 0524  INR 1.16 1.40   Cardiac Enzymes:  Recent Labs Lab 08/07/16 2127 08/08/16 0457 08/08/16 0844 08/08/16 1647  TROPONINI 0.13* 0.11* 0.09* 0.11*   BNP (last 3 results) No results for input(s): PROBNP in the last 8760 hours. HbA1C: No results for input(s): HGBA1C in the last 72 hours. CBG:  Recent Labs Lab 08/08/16 2135 08/08/16 2329 08/09/16 0103 08/09/16 0743 08/09/16 0808  GLUCAP 74 58* 130* 34* 71   Lipid Profile: No results for input(s): CHOL, HDL, LDLCALC, TRIG, CHOLHDL, LDLDIRECT in the last 72 hours. Thyroid Function Tests: No results for input(s): TSH, T4TOTAL, FREET4, T3FREE, THYROIDAB in the last 72 hours. Anemia Panel: No results for input(s): VITAMINB12, FOLATE,  FERRITIN, TIBC, IRON, RETICCTPCT in the last 72 hours. Sepsis Labs:  Recent Labs Lab 08/07/16 2138 08/07/16 2301  LATICACIDVEN 0.61 1.11    No results found for this or any previous visit (from the past 240 hour(s)).       Radiology Studies: Dg Chest 2 View  Result Date: 08/07/2016 CLINICAL DATA:  66 year old female with shortness of breath. EXAM: CHEST  2 VIEW COMPARISON:  Chest radiograph dated 03/13/2016 FINDINGS: There is moderate cardiomegaly with mildly prominent central vasculature and mild cephalization concerning for mild vascular congestion. No pulmonary edema. There is no focal consolidation, pleural effusion, or pneumothorax. No acute osseous pathology identified. IMPRESSION: Moderate cardiomegaly with pulmonary vascular congestion. No pulmonary edema or focal consolidation. Electronically Signed   By: Milas Hock  Radparvar M.D.   On: 08/07/2016 19:54        Scheduled Meds: . amLODipine  10 mg Oral Daily  . aspirin EC  81 mg Oral Daily  . atorvastatin  40 mg Oral QHS  . azithromycin  500 mg Intravenous QHS  . benztropine  0.5 mg Oral Daily  . bumetanide  4 mg Oral BID  . carvedilol  25 mg Oral BID WC  . cefTRIAXone (ROCEPHIN)  IV  1 g Intravenous QHS  . docusate sodium  100 mg Oral BID  . ferrous sulfate  325 mg Oral BID WC  . fluPHENAZine  1 mg Oral QHS  . gabapentin  100 mg Oral QHS  . hydrALAZINE  100 mg Oral BID  . insulin aspart  0-9 Units Subcutaneous TID WC  . ipratropium-albuterol  3 mL Nebulization TID  . isosorbide mononitrate  60 mg Oral Daily  . pantoprazole  20 mg Oral BID  . QUEtiapine  400 mg Oral QHS  . Warfarin - Pharmacist Dosing Inpatient   Does not apply q1800   Continuous Infusions: . heparin 1,100 Units/hr (08/09/16 0619)     LOS: 2 days       Mouhamadou Gittleman Gerome Apley, MD [facility] Pager [phone]  If 7PM-7AM, please contact night-coverage [email or web address] Password [email or web address] 08/09/2016, 10:18 AM

## 2016-08-09 NOTE — Progress Notes (Signed)
ANTICOAGULATION CONSULT NOTE - Follow Up Consult  Pharmacy Consult for Heparin Indication: warfarin for prior DVT, bridging heparin  No Known Allergies  Patient Measurements: Height: 5\' 4"  (162.6 cm) Weight: 161 lb 13.1 oz (73.4 kg) IBW/kg (Calculated) : 54.7 Heparin Dosing Weight:   Vital Signs: Temp: 98.3 F (36.8 C) (03/17 1500) Temp Source: Oral (03/17 1500) BP: 123/56 (03/17 1500) Pulse Rate: 77 (03/17 1500)  Labs:  Recent Labs  08/07/16 2124  08/07/16 2127 08/08/16 0457 08/08/16 0830 08/08/16 0844 08/08/16 1647 08/08/16 2015 08/09/16 0524  HGB 9.2*  --   --  8.4*  8.4*  --   --   --   --  8.1*  HCT 26.9*  --   --  25.2*  25.2*  --   --   --   --  24.8*  PLT 174  --   --  161  165  --   --   --   --  146*  APTT  --   --   --   --  33  --   --   --   --   LABPROT  --   --  14.8  --   --   --   --   --  17.3*  INR  --   --  1.16  --   --   --   --   --  1.40  HEPARINUNFRC  --   --   --   --  <0.10*  --   --  0.38 0.43  CREATININE 4.63*  --   --  4.78*  4.86*  --   --   --   --  4.97*  TROPONINI  --   < > 0.13* 0.11*  --  0.09* 0.11*  --   --   < > = values in this interval not displayed.  Estimated Creatinine Clearance: 11.2 mL/min (A) (by C-G formula based on SCr of 4.97 mg/dL (H)).   Medications:  Infusions:  . heparin 1,100 Units/hr (08/09/16 1500)   Assessment: 66 y.o.femalewith history of diabetes mellitus type 2, chronic kidney disease stage IV to 5, hypertension, CAD status post stenting, chronic anemia, hypertension was brought to the ER after patient was having persistent cough with shortness of breath. Pt has had a surgery for the right foot by Dr. Sharol Given 2 days ago. Patient's symptoms have been present for last 3 days even prior to the surgery. Denies any chest pain. Cough has been nonproductive with patient short of breath even at rest increases on exertion. Patient's Coumadin which patient takes for DVT has been held for the surgery.Home dose  warfarin 5mg  daily  Goal of Therapy:  Heparin level 0.3-0.7 units/ml Monitor platelets by anticoagulation protocol: Yes  Drug interaction: Azithromycin may increase INR  Today, 08/09/2016 INR 1.40 H/H unchanged, Plt sl decreased   Plan:   Continue heparin drip at 1100 units/hr  Warfarin 5mg  today at 1800  Daily INR/heparin level/CBC  Minda Ditto PharmD Pager [phone] 08/09/2016, 5:25 PM

## 2016-08-09 NOTE — Progress Notes (Signed)
Echocardiogram 2D Echocardiogram has been performed.  Robin [name] 08/09/2016, 4:36 PM

## 2016-08-09 NOTE — Progress Notes (Signed)
ANTICOAGULATION CONSULT NOTE - Follow Up Consult  Pharmacy Consult for Heparin Indication: warfarin for prior DVT, bridging heparin  No Known Allergies  Patient Measurements: Height: 5\' 4"  (162.6 cm) Weight: 161 lb 13.1 oz (73.4 kg) IBW/kg (Calculated) : 54.7 Heparin Dosing Weight:   Vital Signs: Temp: 98.4 F (36.9 C) (03/17 0522) Temp Source: Oral (03/17 0522) BP: 129/63 (03/17 0522) Pulse Rate: 77 (03/17 0522)  Labs:  Recent Labs  08/07/16 2124  08/07/16 2127 08/08/16 0457 08/08/16 0830 08/08/16 0844 08/08/16 1647 08/08/16 2015 08/09/16 0524  HGB 9.2*  --   --  8.4*  8.4*  --   --   --   --  8.1*  HCT 26.9*  --   --  25.2*  25.2*  --   --   --   --  24.8*  PLT 174  --   --  161  165  --   --   --   --  146*  APTT  --   --   --   --  33  --   --   --   --   LABPROT  --   --  14.8  --   --   --   --   --  17.3*  INR  --   --  1.16  --   --   --   --   --  1.40  HEPARINUNFRC  --   --   --   --  <0.10*  --   --  0.38 0.43  CREATININE 4.63*  --   --  4.78*  4.86*  --   --   --   --  4.97*  TROPONINI  --   < > 0.13* 0.11*  --  0.09* 0.11*  --   --   < > = values in this interval not displayed.  Estimated Creatinine Clearance: 11.2 mL/min (A) (by C-G formula based on SCr of 4.97 mg/dL (H)).   Medications:  Infusions:  . heparin 1,100 Units/hr (08/09/16 1007)    Assessment: Heparin level at goal.  No heparin issues noted.  Goal of Therapy:  Heparin level 0.3-0.7 units/ml Monitor platelets by anticoagulation protocol: Yes   Plan:  Continue heparin drip at current rate Recheck level with 3/18 AM labs  Nani Skillern [place]ford 08/09/2016,6:46 AM

## 2016-08-10 DIAGNOSIS — I82591 Chronic embolism and thrombosis of other specified deep vein of right lower extremity: Secondary | ICD-10-CM

## 2016-08-10 DIAGNOSIS — D631 Anemia in chronic kidney disease: Secondary | ICD-10-CM

## 2016-08-10 DIAGNOSIS — I5033 Acute on chronic diastolic (congestive) heart failure: Secondary | ICD-10-CM

## 2016-08-10 LAB — BASIC METABOLIC PANEL
ANION GAP: 13 (ref 5–15)
BUN: 55 mg/dL — ABNORMAL HIGH (ref 6–20)
CALCIUM: 8.7 mg/dL — AB (ref 8.9–10.3)
CO2: 21 mmol/L — ABNORMAL LOW (ref 22–32)
Chloride: 101 mmol/L (ref 101–111)
Creatinine, Ser: 4.79 mg/dL — ABNORMAL HIGH (ref 0.44–1.00)
GFR, EST AFRICAN AMERICAN: 10 mL/min — AB (ref >60)
GFR, EST NON AFRICAN AMERICAN: 9 mL/min — AB (ref >60)
Glucose, Bld: 163 mg/dL — ABNORMAL HIGH (ref 65–99)
Potassium: 3.8 mmol/L (ref 3.5–5.1)
SODIUM: 135 mmol/L (ref 135–145)

## 2016-08-10 LAB — CBC WITH DIFFERENTIAL/PLATELET
Basophils Absolute: 0 K/uL (ref 0.0–0.1)
Basophils Relative: 1 %
Eosinophils Absolute: 0.2 K/uL (ref 0.0–0.7)
Eosinophils Relative: 3 %
HCT: 22.2 % — ABNORMAL LOW (ref 36.0–46.0)
Hemoglobin: 7.5 g/dL — ABNORMAL LOW (ref 12.0–15.0)
Lymphocytes Relative: 28 %
Lymphs Abs: 1.7 K/uL (ref 0.7–4.0)
MCH: 30.6 pg (ref 26.0–34.0)
MCHC: 33.8 g/dL (ref 30.0–36.0)
MCV: 90.6 fL (ref 78.0–100.0)
Monocytes Absolute: 0.7 K/uL (ref 0.1–1.0)
Monocytes Relative: 11 %
Neutro Abs: 3.6 K/uL (ref 1.7–7.7)
Neutrophils Relative %: 57 %
Platelets: 165 K/uL (ref 150–400)
RBC: 2.45 MIL/uL — ABNORMAL LOW (ref 3.87–5.11)
RDW: 15.4 % (ref 11.5–15.5)
WBC: 6.2 K/uL (ref 4.0–10.5)

## 2016-08-10 LAB — IRON AND TIBC
Iron: 18 ug/dL — ABNORMAL LOW (ref 28–170)
Saturation Ratios: 9 % — ABNORMAL LOW (ref 10.4–31.8)
TIBC: 199 ug/dL — ABNORMAL LOW (ref 250–450)
UIBC: 181 ug/dL

## 2016-08-10 LAB — PROTIME-INR
INR: 1.56
Prothrombin Time: 18.8 s — ABNORMAL HIGH (ref 11.4–15.2)

## 2016-08-10 LAB — GLUCOSE, CAPILLARY
GLUCOSE-CAPILLARY: 272 mg/dL — AB (ref 65–99)
Glucose-Capillary: 161 mg/dL — ABNORMAL HIGH (ref 65–99)
Glucose-Capillary: 150 mg/dL — ABNORMAL HIGH (ref 65–99)
Glucose-Capillary: 218 mg/dL — ABNORMAL HIGH (ref 65–99)

## 2016-08-10 LAB — FERRITIN: Ferritin: 259 ng/mL (ref 11–307)

## 2016-08-10 LAB — TRANSFERRIN: Transferrin: 142 mg/dL — ABNORMAL LOW (ref 192–382)

## 2016-08-10 LAB — HEPARIN LEVEL (UNFRACTIONATED)
Heparin Unfractionated: 0.22 [IU]/mL — ABNORMAL LOW (ref 0.30–0.70)
Heparin Unfractionated: 0.18 [IU]/mL — ABNORMAL LOW (ref 0.30–0.70)

## 2016-08-10 MED ORDER — HEPARIN BOLUS VIA INFUSION
1000.0000 [IU] | Freq: Once | INTRAVENOUS | Status: AC
  Administered 2016-08-10: 1000 [IU] via INTRAVENOUS
  Filled 2016-08-10: qty 1000

## 2016-08-10 MED ORDER — HEPARIN BOLUS VIA INFUSION
2000.0000 [IU] | Freq: Once | INTRAVENOUS | Status: AC
  Administered 2016-08-10: 2000 [IU] via INTRAVENOUS
  Filled 2016-08-10: qty 2000

## 2016-08-10 MED ORDER — GUAIFENESIN 100 MG/5ML PO SOLN
5.0000 mL | ORAL | Status: DC | PRN
  Administered 2016-08-10: 100 mg via ORAL
  Filled 2016-08-10: qty 10

## 2016-08-10 MED ORDER — WARFARIN SODIUM 5 MG PO TABS
5.0000 mg | ORAL_TABLET | Freq: Once | ORAL | Status: AC
  Administered 2016-08-10: 5 mg via ORAL
  Filled 2016-08-10: qty 1

## 2016-08-10 NOTE — Evaluation (Signed)
Physical Therapy Evaluation Patient Details Name: Robin [name] MRN: [id number] DOB: June 09, 1950 Today's Date: 08/10/2016   History of Present Illness  HPI: Robin [name] is a 66 y.o. female with history of diabetes mellitus type 2, chronic kidney disease stage IV to 5, hypertension, bipolar, L ankle fusion 10/16, CAD status post stenting, chronic anemia, hypertension was brought to the ER after patient was having persistent cough with shortness of breath. Patient has had a surgery for the right foot by Dr. Sharol Given 08/06/16. Patient's symptoms have been present for last 3 days even prior to the surgery.     Clinical Impression  Pt admitted with above diagnosis. Pt currently with functional limitations due to the deficits listed below (see PT Problem List).  Pt will benefit from skilled PT to increase their independence and safety with mobility to allow discharge to the venue listed below.  Pt s/p R foot surgery 08/06/16 with wound vac and per daughter was "light" weight bearing with boot. Daughter reports pt did get up on her own at home and walked on foot without A and that was one reason they ended up coming back to hospital. Pt would benefit from short term SNF as she needs increased S as her R foot heals and she is demonstrating decreased cognition at time of eval. Pt was treated as TDWB> PWB with boot.     Follow Up Recommendations SNF;Supervision for mobility/OOB    Equipment Recommendations  None recommended by PT    Recommendations for Other Services OT consult     Precautions / Restrictions Precautions Precautions: Fall Required Braces or Orthoses: Other Brace/Splint Other Brace/Splint: CAM boot Restrictions Weight Bearing Restrictions: Yes RLE Weight Bearing: Partial weight bearing (per daughter) RLE Partial Weight Bearing Percentage or Pounds: Pt reports after R foot surgery, pt was given a CAM boot and instructed in light weightbearing.      Mobility  Bed Mobility                General bed mobility comments: Pt out of bed upon arrival  Transfers Overall transfer level: Needs assistance   Transfers: Sit to/from Stand;Stand Pivot Transfers Sit to Stand: Min assist Stand pivot transfers: Min assist       General transfer comment: cues for safe technique  Ambulation/Gait Ambulation/Gait assistance: Min assist Ambulation Distance (Feet): 8 Feet Assistive device: Rolling walker (2 wheeled) Gait Pattern/deviations: Step-to pattern;Decreased step length - right;Decreased step length - left     General Gait Details: Pt with CAM boot on with cues for TDWB- PWB status. Pt fatigued after only 8' and duaghter brought chair for her to sit in.  Stairs            Wheelchair Mobility    Modified Rankin (Stroke Patients Only)       Balance Overall balance assessment: Needs assistance           Standing balance-Leahy Scale: Poor Standing balance comment: requiers UE support                             Pertinent Vitals/Pain Pain Assessment: No/denies pain    Home Living Family/patient expects to be discharged to:: Private residence Living Arrangements: Alone Available Help at Discharge: Family;Available 24 hours/day Type of Home: House Home Access: Ramped entrance     Home Layout: One level Home Equipment: Walker - 2 wheels;Walker - 4 wheels;Wheelchair - [employer];Tub bench Additional Comments: boot  Prior Function           Comments: Prior to foot surgery ambulated with cane. R foot surgery 3/14 as an outpatient     Hand Dominance        Extremity/Trunk Assessment        Lower Extremity Assessment Lower Extremity Assessment: RLE deficits/detail RLE Deficits / Details: R foot wrapped       Communication      Cognition Arousal/Alertness: Awake/alert Behavior During Therapy: Flat affect Overall Cognitive Status: Impaired/Different from baseline Area of Impairment:  Memory;Orientation;Problem solving;Following commands;Safety/judgement;Awareness Orientation Level: Disoriented to;Situation;Time   Memory: Decreased short-term memory;Decreased recall of precautions Following Commands: Follows one step commands inconsistently;Follows one step commands with increased time Safety/Judgement: Decreased awareness of deficits Awareness: Intellectual Problem Solving: Slow processing;Difficulty sequencing General Comments: Daughter reports pt does have memory issues at times, but not this severe    General Comments General comments (skin integrity, edema, etc.): R foot surgery 08/06/16 with wrapping on    Exercises     Assessment/Plan    PT Assessment Patient needs continued PT services  PT Problem List Decreased strength;Decreased activity tolerance;Decreased balance;Decreased mobility       PT Treatment Interventions DME instruction;Gait training;Functional mobility training;Therapeutic activities;Therapeutic exercise;Patient/family education    PT Goals (Current goals can be found in the Care Plan section)  Acute Rehab PT Goals Patient Stated Goal: return to PLOF PT Goal Formulation: With family Time For Goal Achievement: 08/24/16 Potential to Achieve Goals: Good    Frequency Min 3X/week   Barriers to discharge Decreased caregiver support      Co-evaluation               End of Session Equipment Utilized During Treatment: Gait belt Activity Tolerance: Patient limited by fatigue Patient left: in chair;with call bell/phone within reach;with family/visitor present Nurse Communication: Mobility status PT Visit Diagnosis: Other abnormalities of gait and mobility (R26.89);Unsteadiness on feet (R26.81)         Time: 0623-7628 PT Time Calculation (min) (ACUTE ONLY): 30 min   Charges:   PT Evaluation $PT Eval Moderate Complexity: 1 Procedure PT Treatments $Therapeutic Activity: 8-22 mins   PT G Codes:         Brayant Dorr  LUBECK 08/10/2016, 1:25 PM

## 2016-08-10 NOTE — Progress Notes (Signed)
PROGRESS NOTE    Robin [name]  AJO:[id number] DOB: 1951/05/25 DOA: 08/07/2016 PCP: [facility]    Brief Narrative:  66 yo female with past medical history of t2dm, stage iv ckd and htn. Presented with worsening dyspnea for the last 3 days. Associated with cough. On the examination, noted to be hypertensive, tachycardic with oxygen saturation 100 % on supplemental 02 per [place] and 87% on room air. Chest film with bilateral interstitial infiltrates. Admitted with the working diagnosis of possible pneumonia or volume overload.    Assessment & Plan:   Principal Problem:   SIRS (systemic inflammatory response syndrome) (HCC) Active Problems:   Type 2 diabetes mellitus with hyperglycemia (HCC)   Essential hypertension   Acute on chronic diastolic heart failure (HCC)   Anemia, chronic renal failure   Chronic renal disease, stage 4, severely decreased glomerular filtration rate between 15-29 mL/min/1.73 square meter (HCC)   CHF (congestive heart failure) ([place])   1. Hypoxic respiratory failure due to pneumonia and volume overload. Continue oxymetry monitoring and supplemental 02 per [place] to target 02 saturation 92%. No defined pneumonic infiltrate but elevated procalcitonin, will dc ceftriaxone and will complete therapy with azithromycin.  2. CKD stage 4. Patient has a AV fistula on the left upper extremity, responded well to 60 mg IV furosemide with improved symptoms and cr. Recorded urine output 500. Follow on phos and PTH. Will continue bumex po, keep negative fluid balance. Will need close follow up with nephrology.   3. DVT. Diagnosed in July 2017, on warfarin therapy at home. Currently on IV heparin bridge. INR is 1,56. Labeled as unprovoked dvt, will continue prolonged anticoagulation.   4. CAD. No angina, continue asa and statin.  5. T2DM. Will continue glucose cover and monitoring with sliding scale, will hold on long acting insulin due to significant reduction in gfr and  risk for hypoglycemia. Patient tolerating po well. Capillary glucose 34, 72, 158  6. HTN. Continue amlodipine, carvedilol, hydralazine and isosorbide. Systolic blood pressure 947 to 130.   7. Depression with bipolar, suspect cognitive impairment. Continue seroquel. Patient slow to respond to questions, somnolent at times, will have physical therapy evaluation before discharge.   8. Diastolic heart failure, acute on chronic. Decompensation, likely due to worsening renal function. Continue after load reduction with hydralazine - isosorbide and b blockade with coreg. Echocardiogram with preserved systolic left ventricle function but positive diastolic dysfunction. Continue bumex.   9. Anemia of chronic disease. Will follow on iron panel, no need for blood transfusion, likely anemia of chronic renal disease. Needs close follow up with nephrology, may need outpatient aranesp.    DVT prophylaxis:heparin  Code Status:fill Family Communication:no family at the bedside  Disposition Plan:home   Consultants:     Procedures:     Antimicrobials:     azithromycin     Subjective: Patient with improved dyspnea, no chest pain, no nausea or vomiting. No abdominal pain or diarrhea.   Objective: Vitals:   08/09/16 2228 08/10/16 0425 08/10/16 0500 08/10/16 0935  BP: 123/60 (!) 132/52    Pulse: 75 81    Resp: 18 18    Temp: 99 F (37.2 C) 98.2 F (36.8 C)    TempSrc: Oral Oral    SpO2: 93% 91%  92%  Weight:   73 kg (161 lb)   Height:        Intake/Output Summary (Last 24 hours) at 08/10/16 0956 Last data filed at 08/10/16 0829  Gross per 24 hour  Intake           1237.4 ml  Output              800 ml  Net            437.4 ml   Filed Weights   08/08/16 0123 08/09/16 0522 08/10/16 0500  Weight: 75.6 kg (166 lb 10.7 oz) 73.4 kg (161 lb 13.1 oz) 73 kg (161 lb)    Examination:  General exam: deconditioned E ENT: mild pallor, oral mucosa, moist Respiratory system:  Mild decreased breath sounds at bases. No rales, no wheezing or rhonchi.  Cardiovascular system: S1 & S2 heard, RRR. No JVD, murmurs, rubs, gallops or clicks. ++ non pitting pedal edema on the right.  Gastrointestinal system: Abdomen is nondistended, soft and nontender. No organomegaly or masses felt. Normal bowel sounds heard. Central nervous system: Alert and oriented. No focal neurological deficits. Extremities: Symmetric 5 x 5 power. Skin: No rashes, lesions or ulcers     Data Reviewed: I have personally reviewed following labs and imaging studies  CBC:  Recent Labs Lab 08/06/16 0745 08/07/16 2124 08/08/16 0457 08/09/16 0524 08/10/16 0455  WBC  --  6.9 6.2  6.4 5.1 6.2  NEUTROABS  --  4.7  --   --  3.6  HGB 9.9* 9.2* 8.4*  8.4* 8.1* 7.5*  HCT 29.0* 26.9* 25.2*  25.2* 24.8* 22.2*  MCV  --  89.7 92.0  92.3 90.2 90.6  PLT  --  174 161  165 146* 416   Basic Metabolic Panel:  Recent Labs Lab 08/06/16 0745 08/07/16 2124 08/08/16 0457 08/09/16 0524 08/10/16 0455  NA 139 139 136 136 135  K 3.8 2.8* 2.9* 4.0 3.8  CL  --  105 102 102 101  CO2  --  24 23 23  21*  GLUCOSE 43* 46* 144* 48* 163*  BUN  --  50* 52* 55* 55*  CREATININE  --  4.63* 4.78*  4.86* 4.97* 4.79*  CALCIUM  --  8.5* 8.3* 8.7* 8.7*   GFR: Estimated Creatinine Clearance: 11.6 mL/min (A) (by C-G formula based on SCr of 4.79 mg/dL (H)). Liver Function Tests:  Recent Labs Lab 08/07/16 2124  AST 37  ALT 11*  ALKPHOS 65  BILITOT 0.5  PROT 7.1  ALBUMIN 3.1*   No results for input(s): LIPASE, AMYLASE in the last 168 hours. No results for input(s): AMMONIA in the last 168 hours. Coagulation Profile:  Recent Labs Lab 08/07/16 2127 08/09/16 0524 08/10/16 0455  INR 1.16 1.40 1.56   Cardiac Enzymes:  Recent Labs Lab 08/07/16 2127 08/08/16 0457 08/08/16 0844 08/08/16 1647  TROPONINI 0.13* 0.11* 0.09* 0.11*   BNP (last 3 results) No results for input(s): PROBNP in the last 8760  hours. HbA1C: No results for input(s): HGBA1C in the last 72 hours. CBG:  Recent Labs Lab 08/09/16 0808 08/09/16 1123 08/09/16 1721 08/09/16 2225 08/10/16 0723  GLUCAP 71 158* 178* 166* 161*   Lipid Profile: No results for input(s): CHOL, HDL, LDLCALC, TRIG, CHOLHDL, LDLDIRECT in the last 72 hours. Thyroid Function Tests: No results for input(s): TSH, T4TOTAL, FREET4, T3FREE, THYROIDAB in the last 72 hours. Anemia Panel: No results for input(s): VITAMINB12, FOLATE, FERRITIN, TIBC, IRON, RETICCTPCT in the last 72 hours. Sepsis Labs:  Recent Labs Lab 08/07/16 2138 08/07/16 2301 08/09/16 1041  PROCALCITON  --   --  1.22  LATICACIDVEN 0.61 1.11  --     Recent Results (from the past 240 hour(s))  Blood Culture (routine x 2)     Status: None (Preliminary result)   Collection Time: 08/07/16  8:24 PM  Result Value Ref Range Status   Specimen Description BLOOD RIGHT HAND  Final   Special Requests IN PEDIATRIC BOTTLE 2 CC  Final   Culture   Final    NO GROWTH 1 DAY Performed at [facility], [place] [address] [place], [place] [address]    Report Status PENDING  Incomplete  Blood Culture (routine x 2)     Status: None (Preliminary result)   Collection Time: 08/07/16  9:27 PM  Result Value Ref Range Status   Specimen Description BLOOD RIGHT ANTECUBITAL  Final   Special Requests BOTTLES DRAWN AEROBIC AND ANAEROBIC 5 CC  Final   Culture   Final    NO GROWTH 1 DAY Performed at [facility], [place] [address] [place], [place] [address]    Report Status PENDING  Incomplete         Radiology Studies: Dg Chest 2 View  Result Date: 08/09/2016 CLINICAL DATA:  Shortness of breath and productive cough.  Smoker. EXAM: CHEST  2 VIEW COMPARISON:  08/07/2016. FINDINGS: Stable enlarged cardiac silhouette. Progressive diffuse peribronchial thickening. Interval small amount of pleural thickening or fluid at the left lateral lung base. No acute bony abnormality. IMPRESSION: 1.  Progressive bronchitic changes. 2. Interval small amount of left lateral pleural thickening or fluid. 3. Stable cardiomegaly. Electronically Signed   By: Claudie Revering M.D.   On: 08/09/2016 11:11        Scheduled Meds: . amLODipine  10 mg Oral Daily  . aspirin EC  81 mg Oral Daily  . atorvastatin  40 mg Oral QHS  . azithromycin  500 mg Intravenous QHS  . benztropine  0.5 mg Oral Daily  . bumetanide  4 mg Oral BID  . carvedilol  25 mg Oral BID WC  . cefTRIAXone (ROCEPHIN)  IV  1 g Intravenous QHS  . docusate sodium  100 mg Oral BID  . ferrous sulfate  325 mg Oral BID WC  . fluPHENAZine  1 mg Oral QHS  . gabapentin  100 mg Oral QHS  . hydrALAZINE  100 mg Oral BID  . insulin aspart  0-9 Units Subcutaneous TID WC  . ipratropium-albuterol  3 mL Nebulization TID  . isosorbide mononitrate  60 mg Oral Daily  . pantoprazole  20 mg Oral BID  . QUEtiapine  400 mg Oral QHS  . warfarin  5 mg Oral ONCE-1800  . Warfarin - Pharmacist Dosing Inpatient   Does not apply q1800   Continuous Infusions: . heparin 1,250 Units/hr (08/10/16 0915)     LOS: 3 days      Mauricio Gerome Apley, MD [facility] Pager [phone]  If 7PM-7AM, please contact night-coverage [email or web address] Password [facility] 08/10/2016, 9:56 AM

## 2016-08-10 NOTE — Progress Notes (Signed)
Page night coverage Amin to inform of 23 second run of V-tach. Pt asymptomatic and unaware of this.

## 2016-08-10 NOTE — Progress Notes (Signed)
ANTICOAGULATION CONSULT NOTE - Follow Up Consult  Pharmacy Consult for Heparin Indication: warfarin for prior DVT, bridging heparin  No Known Allergies  Patient Measurements: Height: 5\' 4"  (162.6 cm) Weight: 161 lb (73 kg) IBW/kg (Calculated) : 54.7 Heparin Dosing Weight: 70.5k  Vital Signs: Temp: 98.2 F (36.8 C) (03/18 0425) Temp Source: Oral (03/18 0425) BP: 132/52 (03/18 0425) Pulse Rate: 81 (03/18 0425)  Labs:  Recent Labs  08/07/16 2127 08/08/16 0457  08/08/16 0830 08/08/16 0844 08/08/16 1647 08/08/16 2015 08/09/16 0524 08/10/16 0455  HGB  --  8.4*  8.4*  --   --   --   --   --  8.1* 7.5*  HCT  --  25.2*  25.2*  --   --   --   --   --  24.8* 22.2*  PLT  --  161  165  --   --   --   --   --  146* 165  APTT  --   --   --  33  --   --   --   --   --   LABPROT 14.8  --   --   --   --   --   --  17.3* 18.8*  INR 1.16  --   --   --   --   --   --  1.40 1.56  HEPARINUNFRC  --   --   < > <0.10*  --   --  0.38 0.43 0.22*  CREATININE  --  4.78*  4.86*  --   --   --   --   --  4.97* 4.79*  TROPONINI 0.13* 0.11*  --   --  0.09* 0.11*  --   --   --   < > = values in this interval not displayed.  Estimated Creatinine Clearance: 11.6 mL/min (A) (by C-G formula based on SCr of 4.79 mg/dL (H)).   Medications:  Infusions:  . heparin 1,100 Units/hr (08/10/16 0424)   Assessment: 66 y.o.femalewith history of diabetes mellitus type 2, chronic kidney disease stage IV to 5, hypertension, CAD status post stenting, chronic anemia, hypertension was brought to the ER after patient was having persistent cough with shortness of breath. Pt has had a surgery for the right foot by Dr. Sharol Given 2 days ago. Patient's symptoms have been present for last 3 days even prior to the surgery. Denies any chest pain. Cough has been nonproductive with patient short of breath even at rest increases on exertion. Patient's Coumadin which patient takes for DVT has been held for the surgery.Home dose  warfarin 5mg  daily  Today, 08/10/2016  Heparin level 0.22 subtherapeutic INR 1.56 subtherapeutic Scr 4.79, CrCl ~ 11.6 mls/min Hbg low and decreasing, Plt improved to WNL No bleeding or interruptions per RN Consuming  < 75% of diet Drug interaction: Azithromycin may increase INR  Goal of Therapy:  Heparin level 0.3-0.7 units/ml Monitor platelets by anticoagulation protocol: Yes   Plan:   Bolus heparin 1000 units and increase heparin drip 1250 units/hr  Check heparin level in 8 hours  Warfarin 5mg  today at 1800  Daily INR/heparin level/CBC  Dolly Rias RPh 08/10/2016, 8:53 AM Pager [phone]

## 2016-08-10 NOTE — Progress Notes (Signed)
Brief pharmacy anticoagulation note:  For full note see note from Kavin Leech from earlier today  Assessment and plan 1700 HL 0.18 subtherapeutic (goal 0.3-0.7) No interruptions or bleeding per RN Bolus heparin 2000 units Increase heparin drip to 1450 units/ml Check heparin level in 8 hours  Dolly Rias RPh 08/10/2016, 5:45 PM Pager [phone]

## 2016-08-11 ENCOUNTER — Telehealth (INDEPENDENT_AMBULATORY_CARE_PROVIDER_SITE_OTHER): Payer: Self-pay | Admitting: Orthopedic Surgery

## 2016-08-11 DIAGNOSIS — J189 Pneumonia, unspecified organism: Principal | ICD-10-CM

## 2016-08-11 LAB — CBC WITH DIFFERENTIAL/PLATELET
Basophils Absolute: 0 10*3/uL (ref 0.0–0.1)
Basophils Relative: 1 %
Eosinophils Absolute: 0.2 10*3/uL (ref 0.0–0.7)
Eosinophils Relative: 3 %
HEMATOCRIT: 22.3 % — AB (ref 36.0–46.0)
Hemoglobin: 7.5 g/dL — ABNORMAL LOW (ref 12.0–15.0)
LYMPHS ABS: 1.8 10*3/uL (ref 0.7–4.0)
LYMPHS PCT: 28 %
MCH: 30.4 pg (ref 26.0–34.0)
MCHC: 33.6 g/dL (ref 30.0–36.0)
MCV: 90.3 fL (ref 78.0–100.0)
MONO ABS: 0.7 10*3/uL (ref 0.1–1.0)
MONOS PCT: 10 %
NEUTROS ABS: 3.6 10*3/uL (ref 1.7–7.7)
Neutrophils Relative %: 58 %
Platelets: 210 10*3/uL (ref 150–400)
RBC: 2.47 MIL/uL — ABNORMAL LOW (ref 3.87–5.11)
RDW: 15.4 % (ref 11.5–15.5)
WBC: 6.2 10*3/uL (ref 4.0–10.5)

## 2016-08-11 LAB — HEPARIN LEVEL (UNFRACTIONATED)
HEPARIN UNFRACTIONATED: 0.37 [IU]/mL (ref 0.30–0.70)
HEPARIN UNFRACTIONATED: 0.57 [IU]/mL (ref 0.30–0.70)

## 2016-08-11 LAB — GLUCOSE, CAPILLARY
GLUCOSE-CAPILLARY: 168 mg/dL — AB (ref 65–99)
GLUCOSE-CAPILLARY: 180 mg/dL — AB (ref 65–99)
Glucose-Capillary: 245 mg/dL — ABNORMAL HIGH (ref 65–99)
Glucose-Capillary: 238 mg/dL — ABNORMAL HIGH (ref 65–99)

## 2016-08-11 LAB — IRON AND TIBC
Iron: 18 ug/dL — ABNORMAL LOW (ref 28–170)
SATURATION RATIOS: 9 % — AB (ref 10.4–31.8)
TIBC: 203 ug/dL — ABNORMAL LOW (ref 250–450)
UIBC: 185 ug/dL

## 2016-08-11 LAB — BASIC METABOLIC PANEL
ANION GAP: 12 (ref 5–15)
BUN: 55 mg/dL — AB (ref 6–20)
CALCIUM: 8.6 mg/dL — AB (ref 8.9–10.3)
CO2: 21 mmol/L — AB (ref 22–32)
Chloride: 101 mmol/L (ref 101–111)
Creatinine, Ser: 4.82 mg/dL — ABNORMAL HIGH (ref 0.44–1.00)
GFR calc Af Amer: 10 mL/min — ABNORMAL LOW (ref >60)
GFR calc non Af Amer: 9 mL/min — ABNORMAL LOW (ref >60)
Glucose, Bld: 197 mg/dL — ABNORMAL HIGH (ref 65–99)
Potassium: 3.7 mmol/L (ref 3.5–5.1)
Sodium: 134 mmol/L — ABNORMAL LOW (ref 135–145)

## 2016-08-11 LAB — FERRITIN: FERRITIN: 125 ng/mL (ref 11–307)

## 2016-08-11 LAB — PARATHYROID HORMONE, INTACT (NO CA): PTH: 160 pg/mL — AB (ref 15–65)

## 2016-08-11 LAB — PROTIME-INR
INR: 2.1
Prothrombin Time: 23.9 seconds — ABNORMAL HIGH (ref 11.4–15.2)

## 2016-08-11 LAB — PROCALCITONIN: Procalcitonin: 0.54 ng/mL

## 2016-08-11 LAB — TRANSFERRIN: TRANSFERRIN: 145 mg/dL — AB (ref 192–382)

## 2016-08-11 LAB — PHOSPHORUS: Phosphorus: 4.4 mg/dL (ref 2.5–4.6)

## 2016-08-11 MED ORDER — WARFARIN SODIUM 2.5 MG PO TABS
2.5000 mg | ORAL_TABLET | Freq: Once | ORAL | Status: AC
  Administered 2016-08-11: 2.5 mg via ORAL
  Filled 2016-08-11: qty 1

## 2016-08-11 MED ORDER — IPRATROPIUM-ALBUTEROL 0.5-2.5 (3) MG/3ML IN SOLN
3.0000 mL | Freq: Two times a day (BID) | RESPIRATORY_TRACT | Status: DC
  Administered 2016-08-11 – 2016-08-12 (×3): 3 mL via RESPIRATORY_TRACT
  Filled 2016-08-11 (×3): qty 3

## 2016-08-11 MED ORDER — AZITHROMYCIN 500 MG PO TABS: 500.0000 mg | ORAL_TABLET | Freq: Every day | ORAL | 0 refills | Status: DC

## 2016-08-11 MED ORDER — INSULIN DETEMIR 100 UNIT/ML ~~LOC~~ SOLN: 10.0000 [IU] | Freq: Every day | SUBCUTANEOUS | 0 refills | Status: DC

## 2016-08-11 NOTE — Telephone Encounter (Signed)
I called to advise that the device can be throw out that we do not need this back it. She states that they will keep the visit in office for Thursday.

## 2016-08-11 NOTE — Telephone Encounter (Signed)
FYI

## 2016-08-11 NOTE — Progress Notes (Signed)
CSW provided patient with bed offers. CSW signing off, please consult if new needs arise.   Abundio Miu, [place] Social Worker [facility] Cell#: [phone]

## 2016-08-11 NOTE — Progress Notes (Signed)
ANTICOAGULATION CONSULT NOTE - Follow Up Consult  Pharmacy Consult for Heparin Indication: warfarin for prior DVT, bridging heparin  No Known Allergies  Patient Measurements: Height: 5\' 4"  (162.6 cm) Weight: 167 lb 14.4 oz (76.2 kg) IBW/kg (Calculated) : 54.7 Heparin Dosing Weight:   Vital Signs: Temp: 98 F (36.7 C) (03/19 0427) Temp Source: Oral (03/19 0427) BP: 130/65 (03/19 0427) Pulse Rate: 63 (03/19 0427)  Labs:  Recent Labs  08/08/16 0457 08/08/16 0830 08/08/16 0844 08/08/16 1647  08/09/16 0524 08/10/16 0455 08/10/16 1643 08/11/16 0224  HGB 8.4*  8.4*  --   --   --   --  8.1* 7.5*  --  7.5*  HCT 25.2*  25.2*  --   --   --   --  24.8* 22.2*  --  22.3*  PLT 161  165  --   --   --   --  146* 165  --  210  APTT  --  33  --   --   --   --   --   --   --   LABPROT  --   --   --   --   --  17.3* 18.8*  --  23.9*  INR  --   --   --   --   --  1.40 1.56  --  2.10  HEPARINUNFRC  --  <0.10*  --   --   < > 0.43 0.22* 0.18* 0.37  CREATININE 4.78*  4.86*  --   --   --   --  4.97* 4.79*  --  4.82*  TROPONINI 0.11*  --  0.09* 0.11*  --   --   --   --   --   < > = values in this interval not displayed.  Estimated Creatinine Clearance: 11.8 mL/min (A) (by C-G formula based on SCr of 4.82 mg/dL (H)).   Medications:  Infusions:  . heparin 1,450 Units/hr (08/11/16 0011)    Assessment: Patient with heparin level at goal.  No heparin issues noted.  Goal of Therapy:  Heparin level 0.3-0.7 units/ml Monitor platelets by anticoagulation protocol: Yes   Plan:  Continue heparin drip at current rate Recheck level at [address] [place] 08/11/2016,4:49 AM

## 2016-08-11 NOTE — Progress Notes (Signed)
CSW contacted by CM (Myraette), CM expressed concern about patient's confusion and ability to make decisions. CM requested CSW speak with patient again about SNF.   CSW spoke with patient at bedside regarding SNF placement, patient reported that she is not agreeable. Patient reported she has care at home. Patient oriented to self and place, patient reported that it was 1817. CSW informed patient that it was 2018.   CSW contacted patient's daughter and informed her about bed offers and that patient is not agreeable to SNF. Patient's daughter reported concern about patient's ability to make decisions. CSW agreed to contact patient's doctor to inquire about her concerns. Patient's daughter reported that she would like the patient to go to [name] due to close proximity to her home and she can go stay with patient.   CSW contacted patient's doctor (Dr. Cathlean Sauer), patient's doctor agreed to go speak with patient.   CSW contacted by patient's doctor, patient's doctor reported that patient is now agreeable to SNF.  CSW contacted [facility] and spoke with [place]. Florentina Jenny reported that bed will be available for patient tomorrow.   CSW notified patient's nurse, patient's daughter and patient's doctor.  CSW will continue to follow patient for discharge planning and assist with return.   Abundio Miu, [place] Social Worker [facility] Cell#: [phone]

## 2016-08-11 NOTE — Clinical Social Work Note (Signed)
Clinical Social Work Assessment  Patient Details  Name: Robin [name] MRN: [id number] Date of Birth: 03-04-1951  Date of referral:  08/11/16               Reason for consult:  Facility Placement                Permission sought to share information with:  Chartered certified accountant granted to share information::  Yes, Verbal Permission Granted  Name::        Agency::     Relationship::     Contact Information:     Housing/Transportation Living arrangements for the past 2 months:  Single Family Home Source of Information:  Patient, Adult Children Patient Interpreter Needed:  None Criminal Activity/Legal Involvement Pertinent to Current Situation/Hospitalization:  No - Comment as needed Significant Relationships:  Adult Children Lives with:  Self Do you feel safe going back to the place where you live?  Yes Need for family participation in patient care:  Yes (Comment)(Patient's daughter cares for patient on a daily basis)  Care giving concerns:  Patient resides alone and has a home health aide 2 hrs a day/5 days a week. Patient's daughter reports that patient can complete most ADLs but chooses not to.    Social Worker assessment / plan:  CSW spoke with patient and and patient's daughter at bedside. Patient's speech was minimal. CSW inquired about SNF placement for patient. Patient remained quiet, CSW explained ST rehab at SNF to patient. Patient requested a list of local facilities. CSW inquired about sending patient's information out to see what beds were available to patient, patient agreed. Patient's daughter reported that patient has been to [facility] in the past and it was a good experience. CSW completed patient's FL2. Patient's daughter later reported that patient has decided to go home vs SNF, CSW agreed to provide patient's daughter with bed offers in case they decide to pursue SNF after returning home.   Employment status:  Unemployed Forensic scientist:   Medicare PT Recommendations:  [place] / Referral to community resources:  [place]  Patient/Family's Response to care:  Patient hesitant about SNF, but agreed to have information faxed out to facilities. Patient's daughter reported that she feels patient needs SNF.   Patient/Family's Understanding of and Emotional Response to Diagnosis, Current Treatment, and Prognosis:  Patient presented apprehensive and guarded when speaking with CSW. Patient's speech and eye contact with CSW was minimal. Patient appeared to be agitated as evidenced by rolling her eyes at CSW. Patient's daughter reported that she and her children provide care for patient and that she is tired. Patient's daughter reported that she has no additional family support with patient.    Emotional Assessment Appearance:  Appears stated age Attitude/Demeanor/Rapport:  Apprehensive, Guarded Affect (typically observed):  Agitated Orientation:  Oriented to Self, Oriented to Place Alcohol / Substance use:  Not Applicable Psych involvement (Current and /or in the community):  No (Comment)  Discharge Needs  Concerns to be addressed:  No discharge needs identified Readmission within the last 30 days:  Yes Current discharge risk:  None Barriers to Discharge:  No Barriers Identified   Burnis Medin, LCSW 08/11/2016, 12:22 PM

## 2016-08-11 NOTE — Care Management Important Message (Signed)
Important Message  Patient Details  Name: DURENDA PECHACEK MRN: [id number] Date of Birth: 09-26-1950   Medicare Important Message Given:  Yes    Kerin Salen 08/11/2016, 10:49 AMImportant Message  Patient Details  Name: YARIAH SELVEY MRN: [id number] Date of Birth: 1950-11-06   Medicare Important Message Given:  Yes    Kerin Salen 08/11/2016, 10:49 AM

## 2016-08-11 NOTE — NC FL2 (Signed)
[place] MEDICAID FL2 LEVEL OF CARE SCREENING TOOL     IDENTIFICATION  [name] Birthdate: February 17, 1951 Sex: female Admission Date (Current Location): 08/07/2016  [facility] and [place] Number:  Herbalist and Address:  [facility],  [place], [place]      Provider Number: [id number]  Attending Physician Name and Address:  Tawni Millers, *  Relative Name and Phone Number:       Current Level of Care: Hospital Recommended Level of Care: [place] Prior Approval Number:    Date Approved/Denied:   PASRR Number: [id number]  Discharge Plan: SNF    Current Diagnoses: Patient Active Problem List   Diagnosis Date Noted  . SIRS (systemic inflammatory response syndrome) ([place]) 08/08/2016  . CHF (congestive heart failure) ([place]) 08/07/2016  . CAP (community acquired pneumonia) 03/13/2016  . Right leg DVT ([place]) 12/05/2015  . Acute respiratory failure with hypoxemia ([place])   . Charcot foot due to diabetes mellitus ([place])   . Hyperlipidemia 03/26/2015  . Charcot ankle 03/16/2015  . Confusion 01/21/2015  . Anemia 01/21/2015  . Multiple falls 01/21/2015  . Acute on chronic diastolic heart failure ([place]) 01/21/2015  . Anemia, chronic renal failure   . Ventricular tachycardia ([place])   . Chronic renal disease, stage 4, severely decreased glomerular filtration rate between 15-29 mL/min/1.73 square meter (HCC)   . Anemia in chronic kidney disease 12/09/2014  . CKD (chronic kidney disease) 12/09/2014  . Hypertensive heart/renal disease with failure ([place]) 12/09/2014  . Bipolar affective disorder ([place]) 12/09/2014  . Acute encephalopathy   . Acute delirium 11/18/2014  . Type 2 diabetes mellitus with hyperglycemia ([place]) 11/18/2014  . Essential hypertension 11/18/2014  . Chronic combined systolic and diastolic CHF (congestive heart failure) ([place]) 11/18/2014  . Fracture dislocation of ankle 11/17/2014  . DM type 2,  uncontrolled, with renal complications ([place]) 19/50/9326  . Hypokalemia 11/17/2014  . Essential hypertension, benign 11/17/2014  . Obesity 11/17/2014  . CAD (coronary artery disease), native coronary artery with 2 stents  11/17/2014    Orientation RESPIRATION BLADDER Height & Weight     Self, Place  Normal Continent Weight: 167 lb 14.4 oz (76.2 kg) Height:  5\' 4"  (162.6 cm)  BEHAVIORAL SYMPTOMS/MOOD NEUROLOGICAL BOWEL NUTRITION STATUS        Diet (Diet Renal/carb modified with fluid restriction)  AMBULATORY STATUS COMMUNICATION OF NEEDS Skin   Extensive Assist Verbally Other (Comment) (Incision(Closed)FootRight Abdominal Pads; Gauze; Clean, Dry, Intact; Dressed Daily;)                       Personal Care Assistance Level of Assistance  Bathing, Feeding, Dressing Bathing Assistance: Limited assistance Feeding assistance: Independent Dressing Assistance: Limited assistance     Functional Limitations Info             SPECIAL CARE FACTORS FREQUENCY  PT (By licensed PT), OT (By licensed OT)     PT Frequency: 5x OT Frequency: 5x            Contractures Contractures Info: Not present    Additional Factors Info  Code Status, Allergies Code Status Info: Full Code Allergies Info: NKA           Current Medications (08/11/2016):  This is the current hospital active medication list Current Facility-Administered Medications  Medication Dose Route Frequency Provider Last Rate Last Dose  . acetaminophen (TYLENOL) tablet 650 mg  650 mg Oral Q6H PRN Doreatha Lew  Hal Hope, MD       Or  . acetaminophen (TYLENOL) suppository 650 mg  650 mg Rectal Q6H PRN Rise Patience, MD      . amLODipine (NORVASC) tablet 10 mg  10 mg Oral Daily Rise Patience, MD   10 mg at 08/11/16 1043  . aspirin EC tablet 81 mg  81 mg Oral Daily Rise Patience, MD   81 mg at 08/11/16 1043  . atorvastatin (LIPITOR) tablet 40 mg  40 mg Oral QHS Rise Patience, MD   40 mg at 08/10/16  2113  . azithromycin (ZITHROMAX) 500 mg in dextrose 5 % 250 mL IVPB  500 mg Intravenous QHS Rise Patience, MD   500 mg at 08/10/16 2112  . benztropine (COGENTIN) tablet 0.5 mg  0.5 mg Oral Daily Rise Patience, MD   0.5 mg at 08/11/16 1043  . bumetanide (BUMEX) tablet 4 mg  4 mg Oral BID Rise Patience, MD   4 mg at 08/11/16 0758  . carvedilol (COREG) tablet 25 mg  25 mg Oral BID WC Rise Patience, MD   25 mg at 08/11/16 0758  . docusate sodium (COLACE) capsule 100 mg  100 mg Oral BID Rise Patience, MD   100 mg at 08/11/16 1043  . ferrous sulfate tablet 325 mg  325 mg Oral BID WC Rise Patience, MD   325 mg at 08/11/16 0758  . fluPHENAZine (PROLIXIN) tablet 1 mg  1 mg Oral QHS Rise Patience, MD   1 mg at 08/10/16 2113  . gabapentin (NEURONTIN) capsule 100 mg  100 mg Oral QHS Rise Patience, MD   100 mg at 08/10/16 2113  . guaiFENesin (ROBITUSSIN) 100 MG/5ML solution 100 mg  5 mL Oral Q4H PRN Tawni Millers, MD   100 mg at 08/10/16 1753  . heparin ADULT infusion 100 units/mL (25000 units/230mL sodium chloride 0.45%)  1,450 Units/hr Intravenous Continuous Tawni Millers, MD 14.5 mL/hr at 08/11/16 0011 1,450 Units/hr at 08/11/16 0011  . hydrALAZINE (APRESOLINE) tablet 100 mg  100 mg Oral BID Rise Patience, MD   100 mg at 08/11/16 1042  . insulin aspart (novoLOG) injection 0-9 Units  0-9 Units Subcutaneous TID WC Rise Patience, MD   2 Units at 08/11/16 0757  . ipratropium-albuterol (DUONEB) 0.5-2.5 (3) MG/3ML nebulizer solution 3 mL  3 mL Nebulization Q2H PRN Mauricio Gerome Apley, MD      . ipratropium-albuterol (DUONEB) 0.5-2.5 (3) MG/3ML nebulizer solution 3 mL  3 mL Nebulization BID Tawni Millers, MD   3 mL at 08/11/16 0734  . isosorbide mononitrate (IMDUR) 24 hr tablet 60 mg  60 mg Oral Daily Rise Patience, MD   60 mg at 08/11/16 1043  . oxyCODONE-acetaminophen (PERCOCET/ROXICET) 5-325 MG per tablet 0.5-1 tablet   0.5-1 tablet Oral Q4H PRN Rise Patience, MD   0.5 tablet at 08/11/16 1047  . pantoprazole (PROTONIX) EC tablet 20 mg  20 mg Oral BID Rise Patience, MD   20 mg at 08/11/16 1043  . QUEtiapine (SEROQUEL) tablet 400 mg  400 mg Oral QHS Rise Patience, MD   400 mg at 08/10/16 2113  . Warfarin - Pharmacist Dosing Inpatient   Does not apply q1800 Mauricio Gerome Apley, MD         Discharge Medications: Please see discharge summary for a list of discharge medications.  Relevant Imaging Results:  Relevant Lab Results:   Additional Information  SSN [id number]  Burnis Medin, LCSW

## 2016-08-11 NOTE — Progress Notes (Addendum)
ANTICOAGULATION CONSULT NOTE - Follow Up Consult  Pharmacy Consult for Heparin Indication: warfarin for prior DVT, bridging heparin  No Known Allergies  Patient Measurements: Height: 5\' 4"  (162.6 cm) Weight: 167 lb 14.4 oz (76.2 kg) IBW/kg (Calculated) : 54.7 Heparin Dosing Weight: 70.5k  Vital Signs: Temp: 98.2 F (36.8 C) (03/19 1314) Temp Source: Oral (03/19 1314) BP: 132/64 (03/19 1314) Pulse Rate: 72 (03/19 1314)  Labs:  Recent Labs  08/08/16 1647  08/09/16 0524 08/10/16 0455 08/10/16 1643 08/11/16 0224 08/11/16 1101  HGB  --   < > 8.1* 7.5*  --  7.5*  --   HCT  --   --  24.8* 22.2*  --  22.3*  --   PLT  --   --  146* 165  --  210  --   LABPROT  --   --  17.3* 18.8*  --  23.9*  --   INR  --   --  1.40 1.56  --  2.10  --   HEPARINUNFRC  --   < > 0.43 0.22* 0.18* 0.37 0.57  CREATININE  --   --  4.97* 4.79*  --  4.82*  --   TROPONINI 0.11*  --   --   --   --   --   --   < > = values in this interval not displayed.  Estimated Creatinine Clearance: 11.8 mL/min (A) (by C-G formula based on SCr of 4.82 mg/dL (H)).   Medications:  Infusions:   Assessment: 66 y.o.femalewith history of diabetes mellitus type 2, chronic kidney disease stage IV to 5, hypertension, CAD status post stenting, chronic anemia, hypertension was brought to the ER after patient was having persistent cough with shortness of breath. Pt has had a surgery for the right foot by Dr. Sharol Given 2 days ago. Patient's symptoms have been present for last 3 days even prior to the surgery. Denies any chest pain. Cough has been nonproductive with patient short of breath even at rest increases on exertion. Patient's Coumadin which patient takes for DVT has been held for the surgery.Home dose warfarin 5mg  daily  Today, 08/11/16 Heparin level 0.57 therapeutic INR 2.10 therapeutic Scr 4.82, CrCl ~ 12 mls/min Hbg low and stable, Plt WNL No bleeding or interruptions per RN Consuming  < 75% of diet Drug  interaction: Azithromycin may increase INR  Goal of Therapy:  Heparin level 0.3-0.7 units/ml Monitor platelets by anticoagulation protocol: Yes   Plan:   Heparin drip was therapeutic at 1450 units/hr   With large increase in INR will give 2.5 mg dose today  Daily INR/heparin level/CBC  Pt with pending discharge today. Heparin drip order has been discontinued   Royetta Asal, PharmD, BCPS Pager [phone] 08/11/2016 2:26 PM

## 2016-08-11 NOTE — Discharge Summary (Signed)
Physician Discharge Summary  Robin [name] AOZ:[id number] DOB: 02/07/1951 DOA: 08/07/2016  PCP: [facility] date: 08/07/2016 Discharge date: 08/11/2016  Admitted From: Home  Disposition:  Home   Recommendations for Outpatient Follow-up:  1. Follow up with PCP in 1- weeks 2. Patient with worsening renal function, will need follow up appointment with nephrology at the [name] 3. Azithromycin for 3 days to complete course 4. Follow INR in 48 hours  Home Health: Yes  Equipment/Devices: No  Discharge Condition: Stable  CODE STATUS: Full  Diet recommendation: Heart Healthy / Carb Modified    Brief/Interim Summary: This is a 66 yo female who presented to the hospital with the chief complaint of cough and shortness of breath. 2 days prior to hospitalization she was intervened by orthopedics for right foot surgery. For last 3 days prior to hospitalization, she had been developing worsening dyspnea associated with cough. On initial physical examination blood pressure 141/81, heart rate 100, respiratory rate 18-20, oxygen saturation 100% on supplemental oxygen. She was awake and alert, moist mucous membranes, lungs with no rales rhonchi, heart S1-S2 present rhythmic, abdomen soft, nontender, lower extremities positive nonpitting edema on the right. Sodium 139, potassium 2.2, chloride 105, bicarbonate 24, glucose 46, BUN 50, creatinine 4.63, white count 6.9, hemoglobin 9.2, hematocrit 26.9, platelets 174, chest x-ray with bilateral interstitial infiltrates, EKG sinus rhythm, left axis deviation, positive monomorphic premature ventricular complexes.   She was admitted to the hospital working diagnosis of systemic if the response syndrome to rule out pneumonia.  1. Pneumonia. Patient was placed on antibiotic therapy, supplemental oxygen, oximetry monitoring. Subsequent chest x-rays suggest more volume overload than true infectious process, elevated pro calcitonin. Patient will finish a  short course of antibiotic therapy with azithromycin. Recent foot surgery could be the cause of the elevated pro calcitonin.  2. Acute hypoxic respirations failure due to pulmonary edema due to volume overload. Patient with a worsening kidney function, patient received IV furosemide to achieve negative fluid balance with improvement of her symptoms. She will continue Bumex twice daily. Recent hospitalization for pneumonia in October 2017, it is likely that back then she had a competent of volume overload as well. Will recommend 1200 ml fluid restriction and close follow-up with nephrology.  3. Stage V chronic kidney disease. Patient's discharge creatinine 4.8 probably near baseline, reflecting a worsening kidney function. Will call nephrology clinic at the [facility]. Probably patient is close to hemodialysis. She does have a left upper extremity fistula that seemed to be mature. Patient may benefit from early ultrafiltration to prevent recurrent pulmonary edema. Discharge Potassium 3.7, serum bicarbonate 21, pulse oximetry 97% on room air.  4. Diabetes mellitus type 2. Patient was placed on insulin sliding-scale, capillary glucose 272, 168, 180 over last 24 hours. Will discharge patient on a lower dose of Levemir, 10 units at night to prevent hypoglycemia, note patient is already on glucagon as needed.  5. Chronic deep vein thrombosis right lower extremity. Patient was anticoagulated with heparin intravenously warfarin, INR therapeutic by the time of discharge. INR 2.1  6. Hypertension. Patient was continued on blood pressure agents including amlodipine, Coreg, hydralazine and isosorbide.  7. Diastolic heart failure, acute on chronic. Patient received intravenous furosemide, she was continued on Bumex, echocardiography showed preserved systolic ventricular function with diastolic dysfunction. Continue Coreg and afterload reducing agents.  8. Anemia chronic renal disease with iron deficiency. Patient will  continue aranesp, iron stores showed iron deficiency as well, with a transferrin saturation  of 13, serum iron 26, ferritin 123, recommend follow-up as an outpatient with the nephrology clinic, she may be a candidate for IV iron.  Discharge hb 7.5.   9. Depression, bipolar. Patient continue Seroquel. She was seen by physical therapy with recommendations for skilled nursing facility, patient has opted to go home with home health services.    Discharge Diagnoses:  Principal Problem:   SIRS (systemic inflammatory response syndrome) (HCC) Active Problems:   Type 2 diabetes mellitus with hyperglycemia (HCC)   Essential hypertension   Acute on chronic diastolic heart failure (HCC)   Anemia, chronic renal failure   Chronic renal disease, stage 4, severely decreased glomerular filtration rate between 15-29 mL/min/1.73 square meter (HCC)   CHF (congestive heart failure) ([place])    Discharge Instructions   Allergies as of 08/11/2016   No Known Allergies     Medication List    TAKE these medications   albuterol 108 (90 Base) MCG/ACT inhaler Commonly known as:  PROVENTIL HFA;VENTOLIN HFA Inhale 1-2 puffs into the lungs every 6 (six) hours as needed for wheezing or shortness of breath.   amLODipine 10 MG tablet Commonly known as:  NORVASC Take 10 mg by mouth daily.   aspirin EC 81 MG tablet Take 81 mg by mouth daily.   atorvastatin 80 MG tablet Commonly known as:  LIPITOR Take 40 mg by mouth at bedtime.   azithromycin 500 MG tablet Commonly known as:  ZITHROMAX Take 1 tablet (500 mg total) by mouth daily.   benztropine 0.5 MG tablet Commonly known as:  COGENTIN Take 0.5 mg by mouth daily.   bumetanide 2 MG tablet Commonly known as:  BUMEX Take 4 mg by mouth 2 (two) times daily.   carvedilol 25 MG tablet Commonly known as:  COREG Take 25 mg by mouth 2 (two) times daily with a meal.   darbepoetin 40 MCG/0.4ML Soln injection Commonly known as:  ARANESP Inject 40 mcg into the  skin every 30 (thirty) days.   docusate sodium 100 MG capsule Commonly known as:  COLACE Take 100 mg by mouth 2 (two) times daily.   ferrous sulfate 325 (65 FE) MG tablet Take 325 mg by mouth 2 (two) times daily with a meal.   fluPHENAZine 1 MG tablet Commonly known as:  PROLIXIN Take 1 mg by mouth at bedtime.   gabapentin 100 MG capsule Commonly known as:  NEURONTIN Take 100 mg by mouth at bedtime.   GLUCAGON EMERGENCY 1 MG injection Generic drug:  glucagon Inject 1 mg into the muscle once as needed (for low blood sugar).   hydrALAZINE 100 MG tablet Commonly known as:  APRESOLINE Take 100 mg by mouth 2 (two) times daily.   insulin aspart 100 UNIT/ML injection Commonly known as:  novoLOG Inject 0-15 Units into the skin 3 (three) times daily before meals. Pt uses per sliding scale:  Less than 150:  0 units, 151-250:  5-8 units, 251-350:  8-10 units, greater than 350:  15 units   insulin detemir 100 UNIT/ML injection Commonly known as:  LEVEMIR Inject 0.1 mLs (10 Units total) into the skin at bedtime. What changed:  how much to take   isosorbide mononitrate 60 MG 24 hr tablet Commonly known as:  IMDUR Take 60 mg by mouth daily.   Melatonin 5 MG Tabs Take 5 mg by mouth at bedtime.   oxyCODONE-acetaminophen 5-325 MG tablet Commonly known as:  ROXICET Take 1 tablet by mouth every 4 (four) hours as needed for severe  pain. What changed:  how much to take   pantoprazole 20 MG tablet Commonly known as:  PROTONIX Take 20 mg by mouth 2 (two) times daily.   QUEtiapine 400 MG tablet Commonly known as:  SEROQUEL Take 400 mg by mouth at bedtime.   warfarin 5 MG tablet Commonly known as:  COUMADIN Take 1 tablet (5 mg total) by mouth daily. Take 5mg  every day.       No Known Allergies  Consultations:   Procedures/Studies: Dg Chest 2 View  Result Date: 08/09/2016 CLINICAL DATA:  Shortness of breath and productive cough.  Smoker. EXAM: CHEST  2 VIEW COMPARISON:   08/07/2016. FINDINGS: Stable enlarged cardiac silhouette. Progressive diffuse peribronchial thickening. Interval small amount of pleural thickening or fluid at the left lateral lung base. No acute bony abnormality. IMPRESSION: 1. Progressive bronchitic changes. 2. Interval small amount of left lateral pleural thickening or fluid. 3. Stable cardiomegaly. Electronically Signed   By: Claudie Revering M.D.   On: 08/09/2016 11:11   Dg Chest 2 View  Result Date: 08/07/2016 CLINICAL DATA:  66 year old female with shortness of breath. EXAM: CHEST  2 VIEW COMPARISON:  Chest radiograph dated 03/13/2016 FINDINGS: There is moderate cardiomegaly with mildly prominent central vasculature and mild cephalization concerning for mild vascular congestion. No pulmonary edema. There is no focal consolidation, pleural effusion, or pneumothorax. No acute osseous pathology identified. IMPRESSION: Moderate cardiomegaly with pulmonary vascular congestion. No pulmonary edema or focal consolidation. Electronically Signed   By: Anner Crete M.D.   On: 08/07/2016 19:54   Xr Foot Complete Left  Result Date: 07/16/2016 Two-view radiographs of the left foot shows no active Charcot arthropathy. Her foot is congruent no evidence of stress fractures.  Xr Ankle 2 Views Left  Result Date: 07/16/2016 2 view radiographs left ankle shows stable tibial calcaneal fusion. She does have some prominence radiographically of her old internal fixation from her medial malleolar fracture.  Xr Foot Complete Right  Result Date: 07/16/2016 Three-view radiographs of the right foot shows Charcot collapse through the Lisfranc joint. There is arthritic changes from the base of the first metatarsal medial cuneiform and displacement of the second metatarsal to the middle cuneiform. There is a large bony spur dorsally over the base of the second metatarsal.   (Echo, Carotid, EGD, Colonoscopy, ERCP)    Subjective: Patient feeling better, dyspnea and  cough have been improved, positive generalized weakness.   Discharge Exam: Vitals:   08/11/16 0427 08/11/16 0734  BP: 130/65   Pulse: 63 76  Resp: 18 16  Temp: 98 F (36.7 C)    Vitals:   08/10/16 1952 08/10/16 2318 08/11/16 0427 08/11/16 0734  BP:  (!) 144/67 130/65   Pulse:  65 63 76  Resp:  18 18 16   Temp:  98 F (36.7 C) 98 F (36.7 C)   TempSrc:  Oral Oral   SpO2: 92% 98% 97% 94%  Weight:   76.2 kg (167 lb 14.4 oz)   Height:        General: Pt is alert, awake, not in acute distress Cardiovascular: RRR, S1/S2 +, no rubs, no gallops Respiratory: CTA bilaterally, no wheezing, no rhonchi Abdominal: Soft, NT, ND, bowel sounds + Extremities: non pitting edema on the right lower extremity, no cyanosis    The results of significant diagnostics from this hospitalization (including imaging, microbiology, ancillary and laboratory) are listed below for reference.     Microbiology: Recent Results (from the past 240 hour(s))  Blood Culture (routine x  2)     Status: None (Preliminary result)   Collection Time: 08/07/16  8:24 PM  Result Value Ref Range Status   Specimen Description BLOOD RIGHT HAND  Final   Special Requests IN PEDIATRIC BOTTLE 2 CC  Final   Culture   Final    NO GROWTH 2 DAYS Performed at [facility], [address] [place], [place] [address]    Report Status PENDING  Incomplete  Blood Culture (routine x 2)     Status: None (Preliminary result)   Collection Time: 08/07/16  9:27 PM  Result Value Ref Range Status   Specimen Description BLOOD RIGHT ANTECUBITAL  Final   Special Requests BOTTLES DRAWN AEROBIC AND ANAEROBIC 5 CC  Final   Culture   Final    NO GROWTH 2 DAYS Performed at [facility], [place] [address], [place], [place] [address]    Report Status PENDING  Incomplete     Labs: BNP (last 3 results)  Recent Labs  11/29/15 2132 03/13/16 1443 08/08/16 0157  BNP 356.2* 626.8* 098.1*   Basic Metabolic Panel:  Recent Labs Lab  08/07/16 2124 08/08/16 0457 08/09/16 0524 08/10/16 0455 08/11/16 0224  NA 139 136 136 135 134*  K 2.8* 2.9* 4.0 3.8 3.7  CL 105 102 102 101 101  CO2 24 23 23  21* 21*  GLUCOSE 46* 144* 48* 163* 197*  BUN 50* 52* 55* 55* 55*  CREATININE 4.63* 4.78*  4.86* 4.97* 4.79* 4.82*  CALCIUM 8.5* 8.3* 8.7* 8.7* 8.6*  PHOS  --   --   --   --  4.4   Liver Function Tests:  Recent Labs Lab 08/07/16 2124  AST 37  ALT 11*  ALKPHOS 65  BILITOT 0.5  PROT 7.1  ALBUMIN 3.1*   No results for input(s): LIPASE, AMYLASE in the last 168 hours. No results for input(s): AMMONIA in the last 168 hours. CBC:  Recent Labs Lab 08/07/16 2124 08/08/16 0457 08/09/16 0524 08/10/16 0455 08/11/16 0224  WBC 6.9 6.2  6.4 5.1 6.2 6.2  NEUTROABS 4.7  --   --  3.6 3.6  HGB 9.2* 8.4*  8.4* 8.1* 7.5* 7.5*  HCT 26.9* 25.2*  25.2* 24.8* 22.2* 22.3*  MCV 89.7 92.0  92.3 90.2 90.6 90.3  PLT 174 161  165 146* 165 210   Cardiac Enzymes:  Recent Labs Lab 08/07/16 2127 08/08/16 0457 08/08/16 0844 08/08/16 1647  TROPONINI 0.13* 0.11* 0.09* 0.11*   BNP: Invalid input(s): POCBNP CBG:  Recent Labs Lab 08/10/16 1211 08/10/16 1713 08/10/16 2325 08/11/16 0714 08/11/16 1143  GLUCAP 150* 218* 272* 168* 180*   D-Dimer No results for input(s): DDIMER in the last 72 hours. Hgb A1c No results for input(s): HGBA1C in the last 72 hours. Lipid Profile No results for input(s): CHOL, HDL, LDLCALC, TRIG, CHOLHDL, LDLDIRECT in the last 72 hours. Thyroid function studies No results for input(s): TSH, T4TOTAL, T3FREE, THYROIDAB in the last 72 hours.  Invalid input(s): FREET3 Anemia work up  Recent Labs  08/10/16 1150 08/11/16 0224  FERRITIN 259 125  TIBC 199* 203*  IRON 18* 18*   Urinalysis    Component Value Date/Time   COLORURINE YELLOW 03/13/2016 1420   APPEARANCEUR CLOUDY (A) 03/13/2016 1420   LABSPEC 1.016 03/13/2016 1420   PHURINE 5.5 03/13/2016 1420   GLUCOSEU 250 (A) 03/13/2016 1420    HGBUR NEGATIVE 03/13/2016 1420   BILIRUBINUR NEGATIVE 03/13/2016 1420   KETONESUR NEGATIVE 03/13/2016 1420   PROTEINUR 100 (  A) 03/13/2016 1420   UROBILINOGEN 0.2 01/21/2015 0416   NITRITE NEGATIVE 03/13/2016 1420   LEUKOCYTESUR NEGATIVE 03/13/2016 1420   Sepsis Labs Invalid input(s): PROCALCITONIN,  WBC,  LACTICIDVEN Microbiology Recent Results (from the past 240 hour(s))  Blood Culture (routine x 2)     Status: None (Preliminary result)   Collection Time: 08/07/16  8:24 PM  Result Value Ref Range Status   Specimen Description BLOOD RIGHT HAND  Final   Special Requests IN PEDIATRIC BOTTLE 2 CC  Final   Culture   Final    NO GROWTH 2 DAYS Performed at [facility], [place] [address], [place], [place] [address]    Report Status PENDING  Incomplete  Blood Culture (routine x 2)     Status: None (Preliminary result)   Collection Time: 08/07/16  9:27 PM  Result Value Ref Range Status   Specimen Description BLOOD RIGHT ANTECUBITAL  Final   Special Requests BOTTLES DRAWN AEROBIC AND ANAEROBIC 5 CC  Final   Culture   Final    NO GROWTH 2 DAYS Performed at [facility], [place] [address], [place], [place] [address]    Report Status PENDING  Incomplete     Time coordinating discharge: 45 minutes  SIGNED:   Tawni Millers, MD  [facility] 08/11/2016, 12:01 PM Pager   If 7PM-7AM, please contact night-coverage [email or web address] Password [email or web address]

## 2016-08-11 NOTE — Telephone Encounter (Signed)
Call patient's daughter. We do not need the suction pump back this is a disposable device. Have her reschedule follow up for next week.

## 2016-08-11 NOTE — Telephone Encounter (Signed)
Patients daughter called wanting to let us know that her mother is in the hospital with heart issues, and may not be able to make it to her appointment Thursday. She will call and let us know. Also after she had her surgery she was given a vacuum suction and was wanting to know where she could return it. CB # [phone]

## 2016-08-11 NOTE — Progress Notes (Signed)
Pt is confused when asking her questions concerning discharge plans.  Pt could not clearly answer where she lives.  Spoke with pt's daughter Robin [name] [phone]) concerning HH. Pt needs to go to SNF. If pt decline will go home with [facility]) [place] at home, referral given to [place].

## 2016-08-12 DIAGNOSIS — I10 Essential (primary) hypertension: Secondary | ICD-10-CM | POA: Diagnosis not present

## 2016-08-12 DIAGNOSIS — R5381 Other malaise: Secondary | ICD-10-CM

## 2016-08-12 DIAGNOSIS — R6510 Systemic inflammatory response syndrome (SIRS) of non-infectious origin without acute organ dysfunction: Secondary | ICD-10-CM | POA: Diagnosis not present

## 2016-08-12 DIAGNOSIS — M6281 Muscle weakness (generalized): Secondary | ICD-10-CM | POA: Diagnosis not present

## 2016-08-12 DIAGNOSIS — I5032 Chronic diastolic (congestive) heart failure: Secondary | ICD-10-CM | POA: Diagnosis not present

## 2016-08-12 DIAGNOSIS — R2689 Other abnormalities of gait and mobility: Secondary | ICD-10-CM | POA: Diagnosis not present

## 2016-08-12 DIAGNOSIS — N184 Chronic kidney disease, stage 4 (severe): Secondary | ICD-10-CM | POA: Diagnosis not present

## 2016-08-12 DIAGNOSIS — E1165 Type 2 diabetes mellitus with hyperglycemia: Secondary | ICD-10-CM | POA: Diagnosis not present

## 2016-08-12 DIAGNOSIS — D631 Anemia in chronic kidney disease: Secondary | ICD-10-CM | POA: Diagnosis not present

## 2016-08-12 DIAGNOSIS — I5033 Acute on chronic diastolic (congestive) heart failure: Secondary | ICD-10-CM | POA: Diagnosis not present

## 2016-08-12 LAB — GLUCOSE, CAPILLARY
GLUCOSE-CAPILLARY: 195 mg/dL — AB (ref 65–99)
Glucose-Capillary: 184 mg/dL — ABNORMAL HIGH (ref 65–99)

## 2016-08-12 LAB — PROTIME-INR
INR: 2.25
PROTHROMBIN TIME: 25.3 s — AB (ref 11.4–15.2)

## 2016-08-12 NOTE — Progress Notes (Signed)
Report called to [facility]. Hospital course and discharge reviewed. All questions answered.

## 2016-08-12 NOTE — Discharge Summary (Addendum)
DISCHARGE SUMMARY   Robin [name] DGL:[id number] DOB: Aug 13, 1950 DOA: 08/07/2016  PCP: [place] date: 08/07/2016 Discharge date: 08/11/2016  Admitted From: Home  Disposition:  Home   Recommendations for Outpatient Follow-up:  1. Follow up with PCP in 1- weeks 2. Patient with worsening renal function, will need follow up appointment with nephrology at the [facility] 3. Azithromycin for 3 days to complete course 4. Follow INR in 48 hours  Home Health: Yes  Equipment/Devices: No  Discharge Condition: Stable  CODE STATUS: Full  Diet recommendation: Heart Healthy / Carb Modified    Brief/Interim Summary: This is a 66 yo female who presented to the hospital with the chief complaint of cough and shortness of breath. 2 days prior to hospitalization she was intervened by orthopedics for right foot surgery. For last 3 days prior to hospitalization, she had been developing worsening dyspnea associated with cough. On initial physical examination blood pressure 141/81, heart rate 100, respiratory rate 18-20, oxygen saturation 100% on supplemental oxygen. She was awake and alert, moist mucous membranes, lungs with no rales rhonchi, heart S1-S2 present rhythmic, abdomen soft, nontender, lower extremities positive nonpitting edema on the right. Sodium 139, potassium 2.2, chloride 105, bicarbonate 24, glucose 46, BUN 50, creatinine 4.63, white count 6.9, hemoglobin 9.2, hematocrit 26.9, platelets 174, chest x-ray with bilateral interstitial infiltrates, EKG sinus rhythm, left axis deviation, positive monomorphic premature ventricular complexes.   She was admitted to the hospital working diagnosis of systemic if the response syndrome to rule out pneumonia.  1. Pneumonia. Patient was placed on antibiotic therapy, supplemental oxygen, oximetry monitoring. Subsequent chest x-rays suggest more volume overload than true infectious  process, elevated pro calcitonin. Patient will finish a short course of antibiotic therapy with azithromycin. Recent foot surgery could be the cause of the elevated pro calcitonin. Pneumonia was considered possible and likely.   2. Acute hypoxic respirations failure due to pulmonary edema due to volume overload. Patient with a worsening kidney function, patient received IV furosemide to achieve negative fluid balance with improvement of her symptoms. She will continue Bumex twice daily. Recent hospitalization for pneumonia in October 2017, it is likely that back then she had a competent of volume overload as well. Will recommend 1200 ml fluid restriction and close follow-up with nephrology. Volume overload pulmonary edema more likely die to worsening renal function, CKD.   3. Stage V chronic kidney disease. Patient's discharge creatinine 4.8 probably near baseline, reflecting a worsening kidney function. Will call nephrology clinic at the [name]. Probably patient is close to hemodialysis. She does have a left upper extremity fistula that seemed to be mature. Patient may benefit from early ultrafiltration to prevent recurrent pulmonary edema. Discharge Potassium 3.7, serum bicarbonate 21, pulse oximetry 97% on room air. I contacted the [name] nephrology team and informed about patient's progressive worsening renal function due to CKD, an early appointment will be arranged.   4. Diabetes mellitus type 2. Patient was placed on insulin sliding-scale, capillary glucose 272, 168, 180 over last 24 hours. Will discharge patient on a lower dose of Levemir, 10 units at night to prevent hypoglycemia, note patient is already on glucagon as needed.  5. Chronic deep vein thrombosis right lower extremity. Patient was anticoagulated with heparin intravenously warfarin, INR therapeutic by the time of discharge. INR 2.1  6. Hypertension. Patient was continued on blood pressure agents including amlodipine, Coreg, hydralazine  and isosorbide.  7. Diastolic heart failure, acute on chronic. Patient received intravenous furosemide, she  was continued on Bumex, echocardiography showed preserved systolic ventricular function with diastolic dysfunction. Continue Coreg and afterload reducing agents.  8. Anemia chronic renal disease with iron deficiency. Patient will continue aranesp, iron stores showed iron deficiency as well, with a transferrin saturation of 13, serum iron 26, ferritin 123, recommend follow-up as an outpatient with the nephrology clinic, she may be a candidate for IV iron.  Discharge hb 7.5.   9. Depression, bipolar. Patient continue Seroquel. She was seen by physical therapy with recommendations for skilled nursing facility, patient has opted to go home with home health services.    Discharge Diagnoses:  Principal Problem:   SIRS (systemic inflammatory response syndrome) (HCC) Active Problems:   Type 2 diabetes mellitus with hyperglycemia (HCC)   Essential hypertension   Acute on chronic diastolic heart failure (HCC)   Anemia, chronic renal failure   Chronic renal disease, stage 4, severely decreased glomerular filtration rate between 15-29 mL/min/1.73 square meter (HCC)   CHF (congestive heart failure) ([place])    Discharge Instructions  Allergies as of 08/11/2016   No Known Allergies        Medication List    TAKE these medications   albuterol 108 (90 Base) MCG/ACT inhaler Commonly known as:  PROVENTIL HFA;VENTOLIN HFA Inhale 1-2 puffs into the lungs every 6 (six) hours as needed for wheezing or shortness of breath.   amLODipine 10 MG tablet Commonly known as:  NORVASC Take 10 mg by mouth daily.   aspirin EC 81 MG tablet Take 81 mg by mouth daily.   atorvastatin 80 MG tablet Commonly known as:  LIPITOR Take 40 mg by mouth at bedtime.   azithromycin 500 MG tablet Commonly known as:  ZITHROMAX Take 1 tablet (500 mg total) by mouth daily.   benztropine 0.5 MG  tablet Commonly known as:  COGENTIN Take 0.5 mg by mouth daily.   bumetanide 2 MG tablet Commonly known as:  BUMEX Take 4 mg by mouth 2 (two) times daily.   carvedilol 25 MG tablet Commonly known as:  COREG Take 25 mg by mouth 2 (two) times daily with a meal.   darbepoetin 40 MCG/0.4ML Soln injection Commonly known as:  ARANESP Inject 40 mcg into the skin every 30 (thirty) days.   docusate sodium 100 MG capsule Commonly known as:  COLACE Take 100 mg by mouth 2 (two) times daily.   ferrous sulfate 325 (65 FE) MG tablet Take 325 mg by mouth 2 (two) times daily with a meal.   fluPHENAZine 1 MG tablet Commonly known as:  PROLIXIN Take 1 mg by mouth at bedtime.   gabapentin 100 MG capsule Commonly known as:  NEURONTIN Take 100 mg by mouth at bedtime.   GLUCAGON EMERGENCY 1 MG injection Generic drug:  glucagon Inject 1 mg into the muscle once as needed (for low blood sugar).   hydrALAZINE 100 MG tablet Commonly known as:  APRESOLINE Take 100 mg by mouth 2 (two) times daily.   insulin aspart 100 UNIT/ML injection Commonly known as:  novoLOG Inject 0-15 Units into the skin 3 (three) times daily before meals. Pt uses per sliding scale:  Less than 150:  0 units, 151-250:  5-8 units, 251-350:  8-10 units, greater than 350:  15 units   insulin detemir 100 UNIT/ML injection Commonly known as:  LEVEMIR Inject 0.1 mLs (10 Units total) into the skin at bedtime. What changed:  how much to take   isosorbide mononitrate 60 MG 24 hr tablet Commonly known as:  IMDUR Take 60 mg by mouth daily.   Melatonin 5 MG Tabs Take 5 mg by mouth at bedtime.   oxyCODONE-acetaminophen 5-325 MG tablet Commonly known as:  ROXICET Take 1 tablet by mouth every 4 (four) hours as needed for severe pain. What changed:  how much to take   pantoprazole 20 MG tablet Commonly known as:  PROTONIX Take 20 mg by mouth 2 (two) times daily.   QUEtiapine 400 MG tablet Commonly known as:   SEROQUEL Take 400 mg by mouth at bedtime.   warfarin 5 MG tablet Commonly known as:  COUMADIN Take 1 tablet (5 mg total) by mouth daily. Take 5mg  every day.            No Known Allergies  Consultations:   Procedures/Studies: Imaging Results   Dg Chest 2 View  Result Date: 08/09/2016 CLINICAL DATA:  Shortness of breath and productive cough.  Smoker. EXAM: CHEST  2 VIEW COMPARISON:  08/07/2016. FINDINGS: Stable enlarged cardiac silhouette. Progressive diffuse peribronchial thickening. Interval small amount of pleural thickening or fluid at the left lateral lung base. No acute bony abnormality. IMPRESSION: 1. Progressive bronchitic changes. 2. Interval small amount of left lateral pleural thickening or fluid. 3. Stable cardiomegaly. Electronically Signed   By: Claudie Revering M.D.   On: 08/09/2016 11:11   Dg Chest 2 View  Result Date: 08/07/2016 CLINICAL DATA:  66 year old female with shortness of breath. EXAM: CHEST  2 VIEW COMPARISON:  Chest radiograph dated 03/13/2016 FINDINGS: There is moderate cardiomegaly with mildly prominent central vasculature and mild cephalization concerning for mild vascular congestion. No pulmonary edema. There is no focal consolidation, pleural effusion, or pneumothorax. No acute osseous pathology identified. IMPRESSION: Moderate cardiomegaly with pulmonary vascular congestion. No pulmonary edema or focal consolidation. Electronically Signed   By: Anner Crete M.D.   On: 08/07/2016 19:54   Xr Foot Complete Left  Result Date: 07/16/2016 Two-view radiographs of the left foot shows no active Charcot arthropathy. Her foot is congruent no evidence of stress fractures.  Xr Ankle 2 Views Left  Result Date: 07/16/2016 2 view radiographs left ankle shows stable tibial calcaneal fusion. She does have some prominence radiographically of her old internal fixation from her medial malleolar fracture.  Xr Foot Complete Right  Result Date:  07/16/2016 Three-view radiographs of the right foot shows Charcot collapse through the Lisfranc joint. There is arthritic changes from the base of the first metatarsal medial cuneiform and displacement of the second metatarsal to the middle cuneiform. There is a large bony spur dorsally over the base of the second metatarsal.    (Echo, Carotid, EGD, Colonoscopy, ERCP)    Subjective: Patient feeling better, dyspnea and cough have been improved, positive generalized weakness.   Discharge Exam:     Vitals:   08/11/16 0427 08/11/16 0734  BP: 130/65   Pulse: 63 76  Resp: 18 16  Temp: 98 F (36.7 C)          Vitals:   08/10/16 1952 08/10/16 2318 08/11/16 0427 08/11/16 0734  BP:  (!) 144/67 130/65   Pulse:  65 63 76  Resp:  18 18 16   Temp:  98 F (36.7 C) 98 F (36.7 C)   TempSrc:  Oral Oral   SpO2: 92% 98% 97% 94%  Weight:   76.2 kg (167 lb 14.4 oz)   Height:        General: Pt is alert, awake, not in acute distress Cardiovascular: RRR, S1/S2 +, no rubs, no gallops  Respiratory: CTA bilaterally, no wheezing, no rhonchi Abdominal: Soft, NT, ND, bowel sounds + Extremities: non pitting edema on the right lower extremity, no cyanosis     The results of significant diagnostics from this hospitalization (including imaging, microbiology, ancillary and laboratory) are listed below for reference.     Microbiology:        Recent Results (from the past 240 hour(s))  Blood Culture (routine x 2)     Status: None (Preliminary result)   Collection Time: 08/07/16  8:24 PM  Result Value Ref Range Status   Specimen Description BLOOD RIGHT HAND  Final   Special Requests IN PEDIATRIC BOTTLE 2 CC  Final   Culture   Final    NO GROWTH 2 DAYS Performed at [facility], [address], [place], [place] [address]    Report Status PENDING  Incomplete  Blood Culture (routine x 2)     Status: None (Preliminary result)   Collection Time:  08/07/16  9:27 PM  Result Value Ref Range Status   Specimen Description BLOOD RIGHT ANTECUBITAL  Final   Special Requests BOTTLES DRAWN AEROBIC AND ANAEROBIC 5 CC  Final   Culture   Final    NO GROWTH 2 DAYS Performed at [facility], [place] [address], [place], [place] [address]    Report Status PENDING  Incomplete     Labs: BNP (last 3 results)  Recent Labs (within last 365 days)   Recent Labs  11/29/15 2132 03/13/16 1443 08/08/16 0157  BNP 356.2* 626.8* 486.4*     Basic Metabolic Panel:  Last Labs    Recent Labs Lab 08/07/16 2124 08/08/16 0457 08/09/16 0524 08/10/16 0455 08/11/16 0224  NA 139 136 136 135 134*  K 2.8* 2.9* 4.0 3.8 3.7  CL 105 102 102 101 101  CO2 24 23 23  21* 21*  GLUCOSE 46* 144* 48* 163* 197*  BUN 50* 52* 55* 55* 55*  CREATININE 4.63* 4.78*  4.86* 4.97* 4.79* 4.82*  CALCIUM 8.5* 8.3* 8.7* 8.7* 8.6*  PHOS  --   --   --   --  4.4     Liver Function Tests:  Last Labs    Recent Labs Lab 08/07/16 2124  AST 37  ALT 11*  ALKPHOS 65  BILITOT 0.5  PROT 7.1  ALBUMIN 3.1*     Last Labs   No results for input(s): LIPASE, AMYLASE in the last 168 hours.   Last Labs   No results for input(s): AMMONIA in the last 168 hours.   CBC:  Last Labs    Recent Labs Lab 08/07/16 2124 08/08/16 0457 08/09/16 0524 08/10/16 0455 08/11/16 0224  WBC 6.9 6.2  6.4 5.1 6.2 6.2  NEUTROABS 4.7  --   --  3.6 3.6  HGB 9.2* 8.4*  8.4* 8.1* 7.5* 7.5*  HCT 26.9* 25.2*  25.2* 24.8* 22.2* 22.3*  MCV 89.7 92.0  92.3 90.2 90.6 90.3  PLT 174 161  165 146* 165 210     Cardiac Enzymes:  Last Labs    Recent Labs Lab 08/07/16 2127 08/08/16 0457 08/08/16 0844 08/08/16 1647  TROPONINI 0.13* 0.11* 0.09* 0.11*     BNP: Last Labs   Invalid input(s): POCBNP   CBG:  Last Labs    Recent Labs Lab 08/10/16 1211 08/10/16 1713 08/10/16 2325 08/11/16 0714 08/11/16 1143  GLUCAP 150* 218* 272* 168* 180*      D-Dimer Recent Labs (last 2 labs)   No results for  input(s): DDIMER in the last 72 hours.   Hgb A1c Recent Labs (last 2 labs)   No results for input(s): HGBA1C in the last 72 hours.   Lipid Profile Recent Labs (last 2 labs)   No results for input(s): CHOL, HDL, LDLCALC, TRIG, CHOLHDL, LDLDIRECT in the last 72 hours.   Thyroid function studies  Recent Labs (last 2 labs)   No results for input(s): TSH, T4TOTAL, T3FREE, THYROIDAB in the last 72 hours.  Invalid input(s): FREET3   Anemia work up  [employer] (last 2 labs)    Recent Labs  08/10/16 1150 08/11/16 0224  FERRITIN 259 125  TIBC 199* 203*  IRON 18* 18*     Urinalysis Labs (Brief)          Component Value Date/Time   COLORURINE YELLOW 03/13/2016 1420   APPEARANCEUR CLOUDY (A) 03/13/2016 1420   LABSPEC 1.016 03/13/2016 1420   PHURINE 5.5 03/13/2016 1420   GLUCOSEU 250 (A) 03/13/2016 1420   HGBUR NEGATIVE 03/13/2016 1420   BILIRUBINUR NEGATIVE 03/13/2016 1420   KETONESUR NEGATIVE 03/13/2016 1420   PROTEINUR 100 (A) 03/13/2016 1420   UROBILINOGEN 0.2 01/21/2015 0416   NITRITE NEGATIVE 03/13/2016 1420   LEUKOCYTESUR NEGATIVE 03/13/2016 1420     Sepsis Labs Last Labs   Invalid input(s): PROCALCITONIN,  WBC,  LACTICIDVEN   Microbiology        Recent Results (from the past 240 hour(s))  Blood Culture (routine x 2)     Status: None (Preliminary result)   Collection Time: 08/07/16  8:24 PM  Result Value Ref Range Status   Specimen Description BLOOD RIGHT HAND  Final   Special Requests IN PEDIATRIC BOTTLE 2 CC  Final   Culture   Final    NO GROWTH 2 DAYS Performed at [facility], [address] [place], [place] [address]    Report Status PENDING  Incomplete  Blood Culture (routine x 2)     Status: None (Preliminary result)   Collection Time: 08/07/16  9:27 PM  Result Value Ref Range Status   Specimen Description BLOOD RIGHT ANTECUBITAL  Final   Special  Requests BOTTLES DRAWN AEROBIC AND ANAEROBIC 5 CC  Final   Culture   Final    NO GROWTH 2 DAYS Performed at [facility], [place] [address] [place], [place] [address]    Report Status PENDING  Incomplete     Time coordinating discharge: 45 minutes  SIGNED:   Tawni Millers, MD      [facility] 08/11/2016, 12:01 PM Pager   If 7PM-7AM, please contact night-coverage [email or web address] Password [email or web address]

## 2016-08-12 NOTE — Clinical Social Work Placement (Signed)
Patient received and accepted bed offer at [facility]. Patient's RN can call report to [phone] patient is going to room 706. Patient is being transported by her daughter [name].  CLINICAL SOCIAL WORK PLACEMENT  NOTE  Date:  08/12/2016  Patient Details  Name: Robin [name] MRN: [id number] Date of Birth: 17-Aug-1950  Clinical Social Work is seeking post-discharge placement for this patient at the [place] level of care (*CSW will initial, date and re-position this form in  chart as items are completed):  Yes   Patient/family provided with [facility] Work Department's list of facilities offering this level of care within the geographic area requested by the patient (or if unable, by the patient's family).  Yes   Patient/family informed of their freedom to choose among providers that offer the needed level of care, that participate in Medicare, Medicaid or managed care program needed by the patient, have an available bed and are willing to accept the patient.  Yes   Patient/family informed of [facility]'s ownership interest in [facility] and [name], as well as of the fact that they are under no obligation to receive care at these facilities.  PASRR submitted to EDS on       PASRR number received on       Existing PASRR number confirmed on 08/11/16     FL2 transmitted to all facilities in geographic area requested by pt/family on 08/11/16     FL2 transmitted to all facilities within larger geographic area on 08/11/16     Patient informed that his/her managed care company has contracts with or will negotiate with certain facilities, including the following:        Yes   Patient/family informed of bed offers received.  Patient chooses bed at [facility]     Physician recommends and patient chooses bed at      Patient to be transferred to [facility] on 08/12/16.  Patient to be transferred to facility by Daughter [name] -  [phone]     Patient family notified on 08/12/16 of transfer.  Name of family member notified:  Daughter [name] - [phone]     PHYSICIAN       Additional Comment:    _______________________________________________ Burnis Medin, LCSW 08/12/2016, 1:11 PM

## 2016-08-12 NOTE — Progress Notes (Signed)
PROGRESS NOTE    Robin [name]  MGQ:[id number] DOB: 11-Aug-1950 DOA: 08/07/2016 PCP: [place]    Brief Narrative:  This is a 66 yo female who presented to the hospital with the chief complaint of cough and shortness of breath. 2 days prior to hospitalization she was intervened by orthopedics for right foot surgery. For last 3 days prior to hospitalization, she had been developing worsening dyspnea associated with cough. On initial physical examination blood pressure 141/81, heart rate 100, respiratory rate 18-20, oxygen saturation 100% on supplemental oxygen. She was awake and alert, moist mucous membranes, lungs with no rales rhonchi, heart S1-S2 present rhythmic, abdomen soft, nontender, lower extremities positive nonpitting edema on the right. Sodium 139, potassium 2.2, chloride 105, bicarbonate 24, glucose 46, BUN 50, creatinine 4.63, white count 6.9, hemoglobin 9.2, hematocrit 26.9, platelets 174, chest x-ray with bilateral interstitial infiltrates, EKG sinus rhythm, left axis deviation, positive monomorphic premature ventricular complexes.   She was admitted to the hospital working diagnosis of systemic if the response syndrome to rule out pneumonia  Assessment & Plan:   Principal Problem:   SIRS (systemic inflammatory response syndrome) (HCC) Active Problems:   Type 2 diabetes mellitus with hyperglycemia (HCC)   Essential hypertension   Acute on chronic diastolic heart failure (HCC)   Anemia, chronic renal failure   Chronic renal disease, stage 4, severely decreased glomerular filtration rate between 15-29 mL/min/1.73 square meter (HCC)   CHF (congestive heart failure) ([place])   1. CKD stage 5. I spoke with nephrology team yesterday, patient will continue current dose of bumex and a prompt follow up will be arranged, patient has progressive worsening renal failure, with elevated risk for volume overload and electrolyte abnormalities.   2. Deconditioning and ambulatory  dysfunction. Patient evaluated by physical therapy with recommendation for snf, patient has agreed for this disposition, plan for discharge today.    DVT prophylaxis:heparin  Code Status:fill Family Communication:no family at the bedside  Disposition Plan:home   Consultants:    Procedures:    Antimicrobials:     azithromycin   Subjective: No major events overnight, patient agreed for rehab, patient's daughter agrees as well.   Objective: Vitals:   08/11/16 1314 08/11/16 1914 08/11/16 2122 08/12/16 0559  BP: 132/64  (!) 144/63 139/68  Pulse: 72  75 75  Resp: 18  18 18   Temp: 98.2 F (36.8 C)  98.4 F (36.9 C) 98.1 F (36.7 C)  TempSrc: Oral  Oral Oral  SpO2: 100% 95% 94% 99%  Weight:    75.5 kg (166 lb 8 oz)  Height:        Intake/Output Summary (Last 24 hours) at 08/12/16 0943 Last data filed at 08/12/16 0601  Gross per 24 hour  Intake           1011.5 ml  Output              700 ml  Net            311.5 ml   Filed Weights   08/10/16 0500 08/11/16 0427 08/12/16 0559  Weight: 73 kg (161 lb) 76.2 kg (167 lb 14.4 oz) 75.5 kg (166 lb 8 oz)    Examination:  General exam: Not in pain or dyspnea E ENT: mild pallor, no icterus.  Respiratory system: Clear to auscultation. Respiratory effort normal. Cardiovascular system: S1 & S2 heard, RRR. No JVD, murmurs, rubs, gallops or clicks. Trtace pedal edema. Gastrointestinal system: Abdomen is nondistended, soft and nontender. No  organomegaly or masses felt. Normal bowel sounds heard. Central nervous system: . No focal neurological deficits. Extremities: Symmetric 5 x 5 power. Skin: No rashes, lesions or ulcers     Data Reviewed: I have personally reviewed following labs and imaging studies  CBC:  Recent Labs Lab 08/07/16 2124 08/08/16 0457 08/09/16 0524 08/10/16 0455 08/11/16 0224  WBC 6.9 6.2  6.4 5.1 6.2 6.2  NEUTROABS 4.7  --   --  3.6 3.6  HGB 9.2* 8.4*  8.4* 8.1* 7.5* 7.5*  HCT  26.9* 25.2*  25.2* 24.8* 22.2* 22.3*  MCV 89.7 92.0  92.3 90.2 90.6 90.3  PLT 174 161  165 146* 165 161   Basic Metabolic Panel:  Recent Labs Lab 08/07/16 2124 08/08/16 0457 08/09/16 0524 08/10/16 0455 08/11/16 0224  NA 139 136 136 135 134*  K 2.8* 2.9* 4.0 3.8 3.7  CL 105 102 102 101 101  CO2 24 23 23  21* 21*  GLUCOSE 46* 144* 48* 163* 197*  BUN 50* 52* 55* 55* 55*  CREATININE 4.63* 4.78*  4.86* 4.97* 4.79* 4.82*  CALCIUM 8.5* 8.3* 8.7* 8.7* 8.6*  PHOS  --   --   --   --  4.4   GFR: Estimated Creatinine Clearance: 11.7 mL/min (A) (by C-G formula based on SCr of 4.82 mg/dL (H)). Liver Function Tests:  Recent Labs Lab 08/07/16 2124  AST 37  ALT 11*  ALKPHOS 65  BILITOT 0.5  PROT 7.1  ALBUMIN 3.1*   No results for input(s): LIPASE, AMYLASE in the last 168 hours. No results for input(s): AMMONIA in the last 168 hours. Coagulation Profile:  Recent Labs Lab 08/07/16 2127 08/09/16 0524 08/10/16 0455 08/11/16 0224 08/12/16 0558  INR 1.16 1.40 1.56 2.10 2.25   Cardiac Enzymes:  Recent Labs Lab 08/07/16 2127 08/08/16 0457 08/08/16 0844 08/08/16 1647  TROPONINI 0.13* 0.11* 0.09* 0.11*   BNP (last 3 results) No results for input(s): PROBNP in the last 8760 hours. HbA1C: No results for input(s): HGBA1C in the last 72 hours. CBG:  Recent Labs Lab 08/11/16 0714 08/11/16 1143 08/11/16 1658 08/11/16 2127 08/12/16 0739  GLUCAP 168* 180* 245* 238* 184*   Lipid Profile: No results for input(s): CHOL, HDL, LDLCALC, TRIG, CHOLHDL, LDLDIRECT in the last 72 hours. Thyroid Function Tests: No results for input(s): TSH, T4TOTAL, FREET4, T3FREE, THYROIDAB in the last 72 hours. Anemia Panel:  Recent Labs  08/10/16 1150 08/11/16 0224  FERRITIN 259 125  TIBC 199* 203*  IRON 18* 18*   Sepsis Labs:  Recent Labs Lab 08/07/16 2138 08/07/16 2301 08/09/16 1041 08/11/16 0224  PROCALCITON  --   --  1.22 0.54  LATICACIDVEN 0.61 1.11  --   --      Recent Results (from the past 240 hour(s))  Blood Culture (routine x 2)     Status: None (Preliminary result)   Collection Time: 08/07/16  8:24 PM  Result Value Ref Range Status   Specimen Description BLOOD RIGHT HAND  Final   Special Requests IN PEDIATRIC BOTTLE 2 CC  Final   Culture   Final    NO GROWTH 3 DAYS Performed at [facility], [place] [address], [place], [place] [address]    Report Status PENDING  Incomplete  Blood Culture (routine x 2)     Status: None (Preliminary result)   Collection Time: 08/07/16  9:27 PM  Result Value Ref Range Status   Specimen Description BLOOD RIGHT ANTECUBITAL  Final   Special Requests BOTTLES  DRAWN AEROBIC AND ANAEROBIC 5 CC  Final   Culture   Final    NO GROWTH 3 DAYS Performed at [facility], [place] [address] [place], [place] [address]    Report Status PENDING  Incomplete         Radiology Studies: No results found.      Scheduled Meds: . amLODipine  10 mg Oral Daily  . aspirin EC  81 mg Oral Daily  . atorvastatin  40 mg Oral QHS  . azithromycin  500 mg Intravenous QHS  . benztropine  0.5 mg Oral Daily  . bumetanide  4 mg Oral BID  . carvedilol  25 mg Oral BID WC  . docusate sodium  100 mg Oral BID  . ferrous sulfate  325 mg Oral BID WC  . fluPHENAZine  1 mg Oral QHS  . gabapentin  100 mg Oral QHS  . hydrALAZINE  100 mg Oral BID  . insulin aspart  0-9 Units Subcutaneous TID WC  . ipratropium-albuterol  3 mL Nebulization BID  . isosorbide mononitrate  60 mg Oral Daily  . pantoprazole  20 mg Oral BID  . QUEtiapine  400 mg Oral QHS  . Warfarin - Pharmacist Dosing Inpatient   Does not apply q1800   Continuous Infusions:   LOS: 5 days     Mauricio Gerome Apley, MD [facility] Pager [phone]  If 7PM-7AM, please contact night-coverage [email or web address] Password [facility] 08/12/2016, 9:43 AM

## 2016-08-13 LAB — CULTURE, BLOOD (ROUTINE X 2)
CULTURE: NO GROWTH
Culture: NO GROWTH

## 2016-08-14 ENCOUNTER — Inpatient Hospital Stay (INDEPENDENT_AMBULATORY_CARE_PROVIDER_SITE_OTHER): Payer: Medicare Other | Admitting: Orthopedic Surgery

## 2016-08-14 ENCOUNTER — Telehealth (INDEPENDENT_AMBULATORY_CARE_PROVIDER_SITE_OTHER): Payer: Self-pay | Admitting: Orthopedic Surgery

## 2016-08-14 ENCOUNTER — Telehealth (INDEPENDENT_AMBULATORY_CARE_PROVIDER_SITE_OTHER): Payer: Self-pay | Admitting: Radiology

## 2016-08-14 NOTE — Telephone Encounter (Signed)
Called to advise pt is non weight bearing s/p a right foot lisfranc fusion. She can for balance purposes for transfers minimally weight bear through the heel if necessary for balance.

## 2016-08-14 NOTE — Telephone Encounter (Signed)
Samuella Bruin a physical therapist from [facility] called stating that she needs the patient's weight bearing status.  CB#[phone].  Thank you.

## 2016-08-18 DIAGNOSIS — R072 Precordial pain: Secondary | ICD-10-CM | POA: Diagnosis present

## 2016-08-18 DIAGNOSIS — M79671 Pain in right foot: Secondary | ICD-10-CM | POA: Diagnosis not present

## 2016-08-18 DIAGNOSIS — M14671 Charcot's joint, right ankle and foot: Secondary | ICD-10-CM | POA: Diagnosis not present

## 2016-08-18 DIAGNOSIS — R0602 Shortness of breath: Secondary | ICD-10-CM | POA: Diagnosis not present

## 2016-08-18 DIAGNOSIS — F17210 Nicotine dependence, cigarettes, uncomplicated: Secondary | ICD-10-CM | POA: Diagnosis not present

## 2016-08-18 DIAGNOSIS — Z09 Encounter for follow-up examination after completed treatment for conditions other than malignant neoplasm: Secondary | ICD-10-CM | POA: Diagnosis not present

## 2016-08-18 DIAGNOSIS — G9340 Encephalopathy, unspecified: Secondary | ICD-10-CM | POA: Diagnosis not present

## 2016-08-18 DIAGNOSIS — N184 Chronic kidney disease, stage 4 (severe): Secondary | ICD-10-CM | POA: Diagnosis not present

## 2016-08-18 DIAGNOSIS — Z955 Presence of coronary angioplasty implant and graft: Secondary | ICD-10-CM | POA: Diagnosis not present

## 2016-08-18 DIAGNOSIS — F3170 Bipolar disorder, currently in remission, most recent episode unspecified: Secondary | ICD-10-CM | POA: Diagnosis not present

## 2016-08-18 DIAGNOSIS — N183 Chronic kidney disease, stage 3 (moderate): Secondary | ICD-10-CM | POA: Diagnosis not present

## 2016-08-18 DIAGNOSIS — E8809 Other disorders of plasma-protein metabolism, not elsewhere classified: Secondary | ICD-10-CM | POA: Diagnosis not present

## 2016-08-18 DIAGNOSIS — I130 Hypertensive heart and chronic kidney disease with heart failure and stage 1 through stage 4 chronic kidney disease, or unspecified chronic kidney disease: Secondary | ICD-10-CM | POA: Diagnosis not present

## 2016-08-18 DIAGNOSIS — Z794 Long term (current) use of insulin: Secondary | ICD-10-CM | POA: Diagnosis not present

## 2016-08-18 DIAGNOSIS — E440 Moderate protein-calorie malnutrition: Secondary | ICD-10-CM | POA: Diagnosis not present

## 2016-08-18 DIAGNOSIS — Z7982 Long term (current) use of aspirin: Secondary | ICD-10-CM | POA: Diagnosis not present

## 2016-08-18 DIAGNOSIS — R0789 Other chest pain: Secondary | ICD-10-CM | POA: Diagnosis not present

## 2016-08-18 DIAGNOSIS — E1165 Type 2 diabetes mellitus with hyperglycemia: Secondary | ICD-10-CM | POA: Diagnosis not present

## 2016-08-18 DIAGNOSIS — E782 Mixed hyperlipidemia: Secondary | ICD-10-CM | POA: Diagnosis not present

## 2016-08-18 DIAGNOSIS — I2510 Atherosclerotic heart disease of native coronary artery without angina pectoris: Secondary | ICD-10-CM | POA: Diagnosis not present

## 2016-08-18 DIAGNOSIS — R2689 Other abnormalities of gait and mobility: Secondary | ICD-10-CM | POA: Diagnosis not present

## 2016-08-18 DIAGNOSIS — Z79899 Other long term (current) drug therapy: Secondary | ICD-10-CM | POA: Diagnosis not present

## 2016-08-18 DIAGNOSIS — R2681 Unsteadiness on feet: Secondary | ICD-10-CM | POA: Diagnosis not present

## 2016-08-18 DIAGNOSIS — I132 Hypertensive heart and chronic kidney disease with heart failure and with stage 5 chronic kidney disease, or end stage renal disease: Secondary | ICD-10-CM | POA: Diagnosis not present

## 2016-08-18 DIAGNOSIS — I5042 Chronic combined systolic (congestive) and diastolic (congestive) heart failure: Secondary | ICD-10-CM | POA: Diagnosis not present

## 2016-08-18 DIAGNOSIS — K219 Gastro-esophageal reflux disease without esophagitis: Secondary | ICD-10-CM | POA: Diagnosis not present

## 2016-08-18 DIAGNOSIS — R079 Chest pain, unspecified: Secondary | ICD-10-CM | POA: Diagnosis not present

## 2016-08-18 DIAGNOSIS — J189 Pneumonia, unspecified organism: Secondary | ICD-10-CM | POA: Diagnosis not present

## 2016-08-18 DIAGNOSIS — N185 Chronic kidney disease, stage 5: Secondary | ICD-10-CM | POA: Diagnosis not present

## 2016-08-18 DIAGNOSIS — E1122 Type 2 diabetes mellitus with diabetic chronic kidney disease: Secondary | ICD-10-CM | POA: Diagnosis not present

## 2016-08-18 DIAGNOSIS — M6281 Muscle weakness (generalized): Secondary | ICD-10-CM | POA: Diagnosis not present

## 2016-08-18 DIAGNOSIS — E785 Hyperlipidemia, unspecified: Secondary | ICD-10-CM | POA: Diagnosis not present

## 2016-08-18 DIAGNOSIS — E1140 Type 2 diabetes mellitus with diabetic neuropathy, unspecified: Secondary | ICD-10-CM | POA: Diagnosis not present

## 2016-08-18 DIAGNOSIS — D631 Anemia in chronic kidney disease: Secondary | ICD-10-CM | POA: Diagnosis not present

## 2016-08-18 DIAGNOSIS — D638 Anemia in other chronic diseases classified elsewhere: Secondary | ICD-10-CM | POA: Diagnosis not present

## 2016-08-18 DIAGNOSIS — E1121 Type 2 diabetes mellitus with diabetic nephropathy: Secondary | ICD-10-CM | POA: Diagnosis not present

## 2016-08-18 DIAGNOSIS — I5032 Chronic diastolic (congestive) heart failure: Secondary | ICD-10-CM | POA: Diagnosis not present

## 2016-08-18 DIAGNOSIS — I10 Essential (primary) hypertension: Secondary | ICD-10-CM | POA: Diagnosis not present

## 2016-08-18 DIAGNOSIS — I5023 Acute on chronic systolic (congestive) heart failure: Secondary | ICD-10-CM | POA: Diagnosis not present

## 2016-08-18 DIAGNOSIS — R531 Weakness: Secondary | ICD-10-CM | POA: Diagnosis not present

## 2016-08-18 DIAGNOSIS — E11610 Type 2 diabetes mellitus with diabetic neuropathic arthropathy: Secondary | ICD-10-CM | POA: Diagnosis not present

## 2016-08-20 ENCOUNTER — Ambulatory Visit (INDEPENDENT_AMBULATORY_CARE_PROVIDER_SITE_OTHER): Payer: Medicare Other | Admitting: Family

## 2016-08-20 ENCOUNTER — Ambulatory Visit (INDEPENDENT_AMBULATORY_CARE_PROVIDER_SITE_OTHER): Payer: Medicare Other | Admitting: Orthopedic Surgery

## 2016-08-21 ENCOUNTER — Non-Acute Institutional Stay (SKILLED_NURSING_FACILITY): Payer: Medicare Other | Admitting: Internal Medicine

## 2016-08-21 ENCOUNTER — Encounter: Payer: Self-pay | Admitting: Internal Medicine

## 2016-08-21 ENCOUNTER — Inpatient Hospital Stay (INDEPENDENT_AMBULATORY_CARE_PROVIDER_SITE_OTHER): Payer: Medicare Other | Admitting: Orthopedic Surgery

## 2016-08-21 DIAGNOSIS — E785 Hyperlipidemia, unspecified: Secondary | ICD-10-CM | POA: Diagnosis not present

## 2016-08-21 DIAGNOSIS — N185 Chronic kidney disease, stage 5: Secondary | ICD-10-CM | POA: Diagnosis not present

## 2016-08-21 DIAGNOSIS — R531 Weakness: Secondary | ICD-10-CM | POA: Diagnosis not present

## 2016-08-21 DIAGNOSIS — I10 Essential (primary) hypertension: Secondary | ICD-10-CM | POA: Diagnosis not present

## 2016-08-21 DIAGNOSIS — M14671 Charcot's joint, right ankle and foot: Secondary | ICD-10-CM

## 2016-08-21 DIAGNOSIS — D638 Anemia in other chronic diseases classified elsewhere: Secondary | ICD-10-CM

## 2016-08-21 DIAGNOSIS — F3170 Bipolar disorder, currently in remission, most recent episode unspecified: Secondary | ICD-10-CM

## 2016-08-21 DIAGNOSIS — K219 Gastro-esophageal reflux disease without esophagitis: Secondary | ICD-10-CM

## 2016-08-21 DIAGNOSIS — I2510 Atherosclerotic heart disease of native coronary artery without angina pectoris: Secondary | ICD-10-CM

## 2016-08-21 DIAGNOSIS — Z794 Long term (current) use of insulin: Secondary | ICD-10-CM | POA: Diagnosis not present

## 2016-08-21 DIAGNOSIS — E1140 Type 2 diabetes mellitus with diabetic neuropathy, unspecified: Secondary | ICD-10-CM | POA: Diagnosis not present

## 2016-08-21 DIAGNOSIS — I5042 Chronic combined systolic (congestive) and diastolic (congestive) heart failure: Secondary | ICD-10-CM | POA: Diagnosis not present

## 2016-08-21 NOTE — Progress Notes (Signed)
LOCATION: Robin [name]  PCP: [facility]   Code Status: Full Code  Goals of care: Advanced Directive information Advanced Directives 08/08/2016  Does Patient Have a Medical Advance Directive? Yes  Type of Paramedic of [place];Living will  Does patient want to make changes to medical advance directive? No - Patient declined  Copy of [place] in Chart? No - copy requested  Would patient like information on creating a medical advance directive? -  Pre-existing out of facility DNR order (yellow form or pink MOST form) -       Extended Emergency Contact Information Primary Emergency Contact: Panik,[name] Address: [address]          [place], [place] [address] [place] Phone: [phone] Relation: Daughter Secondary Emergency Contact: Peyton Bottoms [place] Mobile Phone: [phone] Relation: Friend   No Known Allergies  Chief Complaint  Patient presents with  . New Admit To SNF    New Admission Visit      HPI:  Patient is a 66 y.o. female seen today for short term rehabilitation post hospital admission from 08/13/2016-08/18/2016 with acute encephalopathy thought to be from hypoactive to the AVM. Infectious etiology were ruled out. CT of brain was unremarkable for acute abnormality. Her encephalopathy was thought to be iatrogenic. She was seen by psychiatry service in the hospital. She was taken off her Seroquel and slowly weaned off her fluphenazine. She was also taken off her Coumadin due to completion of treatment course for DVT. She had acute blood loss anemia and required one unit packed red blood cell transfusion. She has medical history of coronary artery disease, congestive heart failure, GERD, hypertension, stage V chronic kidney disease among others. She is seen in her room today. Of note she was in the hospital prior to this hospitalization from 08/07/2016-08/11/2016  with acute respiratory failure from pulmonary edema and pneumonia along with Charcot foot deformity. She is status post open reduction and internal fixation on 08/06/2016 and is currently nonweightbearing to right lower extremity until further seen by orthopedic.  Review of Systems:  Constitutional: Negative for fever, chills, diaphoresis.  energy level has been fair. HENT: Negative for headache, congestion, nasal discharge, sore throat, difficulty swallowing.   Eyes: Negative for eye pain, blurred vision, double vision and discharge.  Respiratory: Negative for cough, shortness of breath and wheezing.   Cardiovascular: Negative for chest pain, palpitations, leg swelling.  Gastrointestinal: Negative for heartburn, nausea, vomiting, abdominal pain, loss of appetite. She had a bowel movement yesterday. Genitourinary: Negative for dysuria Musculoskeletal: Negative for back pain, fall.  Skin: Negative for itching, rash.  Neurological: Negative for dizziness. Psychiatric/Behavioral: Negative for depression.   Past Medical History:  Diagnosis Date  . Anemia   . Anxiety   . Arthritis   . Benign hypertension   . Bipolar disorder ([place])   . CHF (congestive heart failure) ([place])   . Chronic kidney disease, stage V [facility])    Nephrologist is with [facility] (Dr. Tammi Klippel)  . Coronary artery disease   . Depression   . Diabetes mellitus without complication ([place])   . DVT (deep venous thrombosis) (HCC)    right lower leg  . GERD (gastroesophageal reflux disease)   . Heart murmur   . History of blood transfusion   . History of bronchitis   . History of pneumonia   . Shortness of breath dyspnea   . Sleep apnea   . Type  2 diabetes mellitus ([place])    Past Surgical History:  Procedure Laterality Date  . ABDOMINAL HYSTERECTOMY    . ANKLE CLOSED REDUCTION N/A 11/17/2014   Procedure: CLOSED REDUCTION ANKLE;  Surgeon: Earlie Server, MD;  Location: [place];  Service: Orthopedics;  Laterality: N/A;  .  ANKLE FUSION Left 03/16/2015   Procedure: Left Tibiocalcaneal Fusion;  Surgeon: Newt Minion, MD;  Location: [place];  Service: Orthopedics;  Laterality: Left;  . APPLICATION OF WOUND VAC Right 08/06/2016   Procedure: APPLICATION OF PREVENA WOUND VAC;  Surgeon: Newt Minion, MD;  Location: [place];  Service: Orthopedics;  Laterality: Right;  . AV FISTULA PLACEMENT Left [id number]   done at [facility]     2 stent   . CHOLECYSTECTOMY    . CORONARY STENT PLACEMENT    . FOOT ARTHRODESIS Right 08/06/2016   Procedure: Right Foot Fusion Lisfranc Joint;  Surgeon: Newt Minion, MD;  Location: [place];  Service: Orthopedics;  Laterality: Right;  . HARDWARE REMOVAL Left 03/16/2015   Procedure: Removal Hardware Left Ankle;  Surgeon: Newt Minion, MD;  Location: [place];  Service: Orthopedics;  Laterality: Left;  . ORIF ANKLE FRACTURE Left 11/20/2014   Procedure: OPEN REDUCTION INTERNAL FIXATION (ORIF) ANKLE FRACTURE;  Surgeon: Renette Butters, MD;  Location: [place];  Service: Orthopedics;  Laterality: Left;  . TONSILLECTOMY     Social History:   reports that she has been smoking Cigarettes.  She has been smoking about 1.00 pack per day. She has never used smokeless tobacco. She reports that she does not drink alcohol or use drugs.  Family History  Problem Relation Age of Onset  . Family history unknown: Yes    Medications: Allergies as of 08/21/2016   No Known Allergies     Medication List       Accurate as of 08/21/16 11:12 AM. Always use your most recent med list.          acetaminophen 325 MG tablet Commonly known as:  TYLENOL Take 650 mg by mouth every 4 (four) hours as needed for mild pain.   amLODipine 10 MG tablet Commonly known as:  NORVASC Take 10 mg by mouth daily.   ammonium lactate 12 % lotion Commonly known as:  LAC-HYDRIN Apply 1 application topically 2 (two) times daily.   ARIPiprazole 5 MG tablet Commonly known as:  ABILIFY Take 2.5 mg by mouth  at bedtime as needed.   ARIPiprazole 15 MG tablet Commonly known as:  ABILIFY Take 7.5 mg by mouth daily.   aspirin EC 81 MG tablet Take 81 mg by mouth daily.   atorvastatin 80 MG tablet Commonly known as:  LIPITOR Take 40 mg by mouth at bedtime.   benzoyl peroxide 10 % gel Apply 1 application topically daily.   bumetanide 2 MG tablet Commonly known as:  BUMEX Take 4 mg by mouth 2 (two) times daily.   carvedilol 25 MG tablet Commonly known as:  COREG Take 25 mg by mouth 2 (two) times daily with a meal.   Cholecalciferol 50000 units capsule Take 50,000 Units by mouth once a week.   clindamycin 1 % lotion Commonly known as:  CLEOCIN T Apply 1 application topically daily. Apply axilla, groin and buttock   dextrose 40 % Gel Commonly known as:  GLUTOSE Take 1 Tube by mouth as needed for low blood sugar.   ferrous sulfate 325 (65 FE) MG tablet Take 325 mg by mouth  2 (two) times daily with a meal.   gabapentin 100 MG capsule Commonly known as:  NEURONTIN Take 100 mg by mouth at bedtime.   hydrALAZINE 50 MG tablet Commonly known as:  APRESOLINE Take 50 mg by mouth every 8 (eight) hours.   insulin aspart 100 UNIT/ML injection Commonly known as:  novoLOG Inject 1-10 Units into the skin 3 (three) times daily before meals. < 150 = 0 units, 150-200= 2 units, 201-250=4 units, 251-300= 6 units, 301-350= 8 units, 351-400= 10 units and CALL MD   insulin detemir 100 UNIT/ML injection Commonly known as:  LEVEMIR Inject 12 Units into the skin at bedtime.   insulin lispro 100 UNIT/ML injection Commonly known as:  HUMALOG Inject 6 Units into the skin 3 (three) times daily before meals.   isosorbide mononitrate 60 MG 24 hr tablet Commonly known as:  IMDUR Take 60 mg by mouth daily.   lidocaine 4 % cream Commonly known as:  LMX Apply 1 application topically 3 (three) times daily.   Melatonin 3 MG Tabs Take 6 mg by mouth at bedtime.   nicotine 21 mg/24hr patch Commonly  known as:  NICODERM CQ - dosed in mg/24 hours Place 21 mg onto the skin daily. Stop date 09/29/16   nicotine 14 mg/24hr patch Commonly known as:  NICODERM CQ - dosed in mg/24 hours Place 14 mg onto the skin daily. Start on 09/30/16. Stop date 11/11/16   nicotine 7 mg/24hr patch Commonly known as:  NICODERM CQ - dosed in mg/24 hr Place 7 mg onto the skin daily. Start on 11/12/16   nicotine polacrilex 4 MG lozenge Commonly known as:  COMMIT Take 4 mg by mouth as needed for smoking cessation.   nitroGLYCERIN 0.4 MG SL tablet Commonly known as:  NITROSTAT Place 0.4 mg under the tongue every 5 (five) minutes as needed for chest pain.   pantoprazole 20 MG tablet Commonly known as:  PROTONIX Take 20 mg by mouth 2 (two) times daily.   polyethylene glycol packet Commonly known as:  MIRALAX / GLYCOLAX Take 17 g by mouth daily.   sennosides-docusate sodium 8.6-50 MG tablet Commonly known as:  SENOKOT-S Take 2 tablets by mouth 2 (two) times daily.       Immunizations: Immunization History  Administered Date(s) Administered  . PPD Test 08/12/2016, 08/18/2016     Physical Exam: Vitals:   08/21/16 1048  BP: 135/67  Pulse: 75  Resp: 17  Temp: 97.9 F (36.6 C)  TempSrc: Oral  SpO2: 96%  Weight: 161 lb (73 kg)  Height: 5\' 2"  (1.575 m)   Body mass index is 29.45 kg/m.  General- Adult female, well built, in no acute distress Head- normocephalic, atraumatic Nose- no maxillary or frontal sinus tenderness, no nasal discharge Throat- moist mucus membrane  Eyes- PERRLA, EOMI, no pallor, no icterus, no discharge, normal conjunctiva, normal sclera Neck- no cervical lymphadenopathy Cardiovascular- normal s1,s2, no murmur Respiratory- bilateral clear to auscultation, no wheeze, no rhonchi, no crackles, no use of accessory muscles Abdomen- bowel sounds present, soft, non tender, no guarding or rigidity, no CVA tenderness Musculoskeletal- able to move all 4 extremities, dressing to right  lower leg clean and dry, no leg edema, left upper extremity AV fistula Neurological- alert and oriented to person, place and time Skin- warm and dry Psychiatry- normal mood and affect    Labs reviewed: Basic Metabolic Panel:  Recent Labs  12/03/15 0525  08/09/16 0524 08/10/16 0455 08/11/16 0224  NA 140  < >  136 135 134*  K 3.9  < > 4.0 3.8 3.7  CL 107  < > 102 101 101  CO2 24  < > 23 21* 21*  GLUCOSE 140*  < > 48* 163* 197*  BUN 58*  < > 55* 55* 55*  CREATININE 3.98*  < > 4.97* 4.79* 4.82*  CALCIUM 8.7*  < > 8.7* 8.7* 8.6*  MG 2.0  --   --   --   --   PHOS  --   --   --   --  4.4  < > = values in this interval not displayed. Liver Function Tests:  Recent Labs  03/13/16 1426 08/07/16 2124  AST 17 37  ALT 14 11*  ALKPHOS 89 65  BILITOT 0.8 0.5  PROT 7.6 7.1  ALBUMIN 3.4* 3.1*   No results for input(s): LIPASE, AMYLASE in the last 8760 hours. No results for input(s): AMMONIA in the last 8760 hours. CBC:  Recent Labs  08/07/16 2124  08/09/16 0524 08/10/16 0455 08/11/16 0224  WBC 6.9  < > 5.1 6.2 6.2  NEUTROABS 4.7  --   --  3.6 3.6  HGB 9.2*  < > 8.1* 7.5* 7.5*  HCT 26.9*  < > 24.8* 22.2* 22.3*  MCV 89.7  < > 90.2 90.6 90.3  PLT 174  < > 146* 165 210  < > = values in this interval not displayed. Cardiac Enzymes:  Recent Labs  08/08/16 0457 08/08/16 0844 08/08/16 1647  TROPONINI 0.11* 0.09* 0.11*   BNP: Invalid input(s): POCBNP CBG:  Recent Labs  08/11/16 2127 08/12/16 0739 08/12/16 1149  GLUCAP 238* 184* 195*    Radiological Exams: Dg Chest 2 View  Result Date: 08/09/2016 CLINICAL DATA:  Shortness of breath and productive cough.  Smoker. EXAM: CHEST  2 VIEW COMPARISON:  08/07/2016. FINDINGS: Stable enlarged cardiac silhouette. Progressive diffuse peribronchial thickening. Interval small amount of pleural thickening or fluid at the left lateral lung base. No acute bony abnormality. IMPRESSION: 1. Progressive bronchitic changes. 2. Interval  small amount of left lateral pleural thickening or fluid. 3. Stable cardiomegaly. Electronically Signed   By: Claudie Revering M.D.   On: 08/09/2016 11:11   Dg Chest 2 View  Result Date: 08/07/2016 CLINICAL DATA:  66 year old female with shortness of breath. EXAM: CHEST  2 VIEW COMPARISON:  Chest radiograph dated 03/13/2016 FINDINGS: There is moderate cardiomegaly with mildly prominent central vasculature and mild cephalization concerning for mild vascular congestion. No pulmonary edema. There is no focal consolidation, pleural effusion, or pneumothorax. No acute osseous pathology identified. IMPRESSION: Moderate cardiomegaly with pulmonary vascular congestion. No pulmonary edema or focal consolidation. Electronically Signed   By: Anner Crete M.D.   On: 08/07/2016 19:54    Assessment/Plan  Generalized weakness From physical deconditioning.Will have patient work with PT/OT as tolerated to regain strength and restore function.  Fall precautions are in place.  Anemia of chronic disease Status post 1 unit blood transfusion in the hospital. Continue ferrous sulfate 325 mg twice a day and darbepoetin as needed.  Right Charcot foot Status post surgical repair, provide wound care, to wear a cam boot and nonweightbearing to right lower extremity for now. Will need follow-up with orthopedic. Chronic kidney disease stage V To be followed by nephrology. Monitor clinically for now.  Bipolar disorder Will get psychiatry consult. Continue aripiprazole 7.5 mg daily and 2.5 mg at bedtime as needed.  GERD Stable on pantoprazole 20 mg twice a day, monitor clinically  Chronic constipation  Continue Senokot-S 2 tablets twice a day and MiraLAX 17 g daily and monitor bowel movement  Coronary artery disease Remains chest pain-free. Continue carvedilol 25 mg twice a day, isosorbide mononitrate 60 mg daily with atorvastatin and baby aspirin. Continue nitroglycerin on a needed basis.  Chronic congestive heart  failure Currently on carvedilol 25 mg twice a day, bumetanide 4 mg twice a day, hydralazine 50 mg 3 times a day and isosorbide mononitrate 60 mg daily. Check weight every other day. Monitor vital signs  Hypertension Monitor blood pressure reading, continue current regimen of amlodipine, hydralazine, carvedilol and isosorbide, monitor BMP  Hyperlipidemia Continue atorvastatin 40 mg daily  Type 2 diabetes mellitus with neuropathy Currently on Levemir 12 units daily with 6 units Humalog before meals and additional sliding scale insulin Humalog if needed. Check hemoglobin A1c. Monitor blood sugar reading. Continue gabapentin 100 mg daily at bedtime for nerve pain. Continue atorvastatin and baby aspirin    Goals of care: short term rehabilitation   Labs/tests ordered: CBC, CMP, hemoglobin A1c for 07/15/2016  Family/ staff Communication: reviewed care plan with patient and nursing supervisor  I have spent greater than 50 minutes for this encounter which includes reviewing hospital records, addressing above mentioned concerns, reviewing care plan with patient, answering patient's concerns and counseling her.     Blanchie Serve, MD Internal Medicine [facility] [address] [place], [place] [address] Cell Phone (Monday-Friday 8 am - 5 pm): [phone] On Call: [phone] and follow prompts after 5 pm and on weekends Office Phone: [phone] Office Fax: [phone]

## 2016-08-25 LAB — HEPATIC FUNCTION PANEL
ALK PHOS: 71 U/L (ref 25–125)
ALT: 9 U/L (ref 7–35)
AST: 14 U/L (ref 13–35)
BILIRUBIN, TOTAL: 0.4 mg/dL

## 2016-08-25 LAB — CBC AND DIFFERENTIAL
HEMATOCRIT: 28 % — AB (ref 36–46)
HEMOGLOBIN: 9.2 g/dL — AB (ref 12.0–16.0)
Platelets: 250 10*3/uL (ref 150–399)
WBC: 7.5 10^3/mL

## 2016-08-25 LAB — BASIC METABOLIC PANEL
BUN: 31 mg/dL — AB (ref 4–21)
Creatinine: 3.2 mg/dL — AB (ref 0.5–1.1)
Potassium: 3.8 mmol/L (ref 3.4–5.3)
SODIUM: 140 mmol/L (ref 137–147)

## 2016-08-25 LAB — HEMOGLOBIN A1C: HEMOGLOBIN A1C: 7.7

## 2016-08-26 ENCOUNTER — Non-Acute Institutional Stay (SKILLED_NURSING_FACILITY): Payer: Medicare Other | Admitting: Family

## 2016-08-26 ENCOUNTER — Encounter: Payer: Self-pay | Admitting: Family

## 2016-08-26 DIAGNOSIS — D631 Anemia in chronic kidney disease: Secondary | ICD-10-CM | POA: Diagnosis not present

## 2016-08-26 DIAGNOSIS — N184 Chronic kidney disease, stage 4 (severe): Secondary | ICD-10-CM

## 2016-08-26 DIAGNOSIS — E8809 Other disorders of plasma-protein metabolism, not elsewhere classified: Secondary | ICD-10-CM | POA: Diagnosis not present

## 2016-08-26 DIAGNOSIS — E440 Moderate protein-calorie malnutrition: Secondary | ICD-10-CM

## 2016-08-26 NOTE — Progress Notes (Signed)
Location:  [place] Room Number: 253 Place of Service:  SNF (31) Provider: Marlowe Sax FNP-C  [place]  Patient Care Team: [facility] as PCP - General (General Practice)  Extended Emergency Contact Information Primary Emergency Contact: Welge,[name] Address: [address]          [place], [place] [address] [place] Phone: [phone] Relation: Daughter Secondary Emergency Contact: Peyton Bottoms [place] Mobile Phone: [phone] Relation: Friend  Code Status:  Full Code  Goals of care: Advanced Directive information Advanced Directives 08/26/2016  Does Patient Have a Medical Advance Directive? No  Type of Advance Directive -  Does patient want to make changes to medical advance directive? No - Patient declined  Copy of [place] in Chart? -  Would patient like information on creating a medical advance directive? -  Pre-existing out of facility DNR order (yellow form or pink MOST form) -     Chief Complaint  Patient presents with  . Abnormal Lab    HPI:  Pt is a 66 y.o. female seen today at [facility] rehabilitation for an acute visit for evaluation of abnormal labs.she has a medical history of HTN, CAD, CHF, CKD stage 4, Anemia, Bipolar among other conditions. She is seen in her room today.Her recent lab results showed BUN, CR 3.23, TP 5.7, ALB 3.16, Hgb 9.2, HCT 28.4, Hgb A1C 7.7 ( 08/25/2016). Of note she is here for rehabilitation post hospital admission from 08/13/2016-08/18/2016 with acute encephalopathy thought to be from hypoactive to the AVM. Infectious etiology were ruled out. CT of brain was unremarkable for acute abnormality.Facility Nurse reports no new concerns. She continues to work with Therapy.    Past Medical History:  Diagnosis Date  . Anemia   . Anxiety   . Arthritis   . Benign hypertension   . Bipolar disorder ([place])   . CHF  (congestive heart failure) ([place])   . Chronic kidney disease, stage V [facility])    Nephrologist is with [facility] (Dr. Tammi Klippel)  . Coronary artery disease   . Depression   . Diabetes mellitus without complication ([place])   . DVT (deep venous thrombosis) (HCC)    right lower leg  . GERD (gastroesophageal reflux disease)   . Heart murmur   . History of blood transfusion   . History of bronchitis   . History of pneumonia   . Shortness of breath dyspnea   . Sleep apnea   . Type 2 diabetes mellitus ([place])    Past Surgical History:  Procedure Laterality Date  . ABDOMINAL HYSTERECTOMY    . ANKLE CLOSED REDUCTION N/A 11/17/2014   Procedure: CLOSED REDUCTION ANKLE;  Surgeon: Earlie Server, MD;  Location: [place];  Service: Orthopedics;  Laterality: N/A;  . ANKLE FUSION Left 03/16/2015   Procedure: Left Tibiocalcaneal Fusion;  Surgeon: Newt Minion, MD;  Location: [place];  Service: Orthopedics;  Laterality: Left;  . APPLICATION OF WOUND VAC Right 08/06/2016   Procedure: APPLICATION OF PREVENA WOUND VAC;  Surgeon: Newt Minion, MD;  Location: [place];  Service: Orthopedics;  Laterality: Right;  . AV FISTULA PLACEMENT Left [id number]   done at [place]     2 stent   . CHOLECYSTECTOMY    . CORONARY STENT PLACEMENT    . FOOT ARTHRODESIS Right 08/06/2016   Procedure: Right Foot Fusion Lisfranc Joint;  Surgeon: Meridee Score  V, MD;  Location: [place];  Service: Orthopedics;  Laterality: Right;  . HARDWARE REMOVAL Left 03/16/2015   Procedure: Removal Hardware Left Ankle;  Surgeon: Newt Minion, MD;  Location: [place];  Service: Orthopedics;  Laterality: Left;  . ORIF ANKLE FRACTURE Left 11/20/2014   Procedure: OPEN REDUCTION INTERNAL FIXATION (ORIF) ANKLE FRACTURE;  Surgeon: Renette Butters, MD;  Location: [place];  Service: Orthopedics;  Laterality: Left;  . TONSILLECTOMY      No Known Allergies  Allergies as of 08/26/2016   No Known Allergies     Medication List         Accurate as of 08/26/16  2:17 PM. Always use your most recent med list.          acetaminophen 325 MG tablet Commonly known as:  TYLENOL Take 650 mg by mouth every 4 (four) hours as needed for mild pain.   amLODipine 10 MG tablet Commonly known as:  NORVASC Take 10 mg by mouth daily.   ammonium lactate 12 % lotion Commonly known as:  LAC-HYDRIN Apply 1 application topically 2 (two) times daily.   ARIPiprazole 5 MG tablet Commonly known as:  ABILIFY Take 2.5 mg by mouth at bedtime as needed.   ARIPiprazole 15 MG tablet Commonly known as:  ABILIFY Take 7.5 mg by mouth daily.   aspirin EC 81 MG tablet Take 81 mg by mouth daily.   atorvastatin 80 MG tablet Commonly known as:  LIPITOR Take 40 mg by mouth at bedtime.   benzoyl peroxide 10 % gel Apply 1 application topically daily.   bumetanide 2 MG tablet Commonly known as:  BUMEX Take 4 mg by mouth 2 (two) times daily.   carvedilol 25 MG tablet Commonly known as:  COREG Take 25 mg by mouth 2 (two) times daily with a meal.   Cholecalciferol 50000 units capsule Take 50,000 Units by mouth once a week.   clindamycin 1 % lotion Commonly known as:  CLEOCIN T Apply 1 application topically daily. Apply axilla, groin and buttock   Darbepoetin Alfa 60 MCG/0.3ML Sosy injection Commonly known as:  ARANESP Inject 60 mcg into the skin every 28 (twenty-eight) days.   dextrose 40 % Gel Commonly known as:  GLUTOSE Take 1 Tube by mouth as needed for low blood sugar.   ferrous sulfate 325 (65 FE) MG tablet Take 325 mg by mouth 2 (two) times daily with a meal.   gabapentin 100 MG capsule Commonly known as:  NEURONTIN Take 100 mg by mouth at bedtime.   hydrALAZINE 50 MG tablet Commonly known as:  APRESOLINE Take 50 mg by mouth every 8 (eight) hours.   insulin detemir 100 UNIT/ML injection Commonly known as:  LEVEMIR Inject 12 Units into the skin at bedtime.   insulin lispro 100 UNIT/ML injection Commonly known as:   HUMALOG Inject into the skin 3 (three) times daily before meals. Inject per sliding scale: 150-200= 2 units, 201-250= 4 units, 251-300= 6 units, 301-350= 8 units,351-400= 10 units and call MD   insulin lispro 100 UNIT/ML injection Commonly known as:  HUMALOG Inject 6 Units into the skin 3 (three) times daily before meals.   isosorbide mononitrate 60 MG 24 hr tablet Commonly known as:  IMDUR Take 60 mg by mouth daily.   lidocaine 4 % cream Commonly known as:  LMX Apply 1 application topically 3 (three) times daily.   Melatonin 3 MG Tabs Take 6 mg by mouth at bedtime.   nicotine 21 mg/24hr  patch Commonly known as:  NICODERM CQ - dosed in mg/24 hours Place 21 mg onto the skin daily. Stop date 09/29/16   nicotine 14 mg/24hr patch Commonly known as:  NICODERM CQ - dosed in mg/24 hours Place 14 mg onto the skin daily. Start on 09/30/16. Stop date 11/11/16   nicotine 7 mg/24hr patch Commonly known as:  NICODERM CQ - dosed in mg/24 hr Place 7 mg onto the skin daily. Start on 11/12/16   nicotine polacrilex 4 MG lozenge Commonly known as:  COMMIT Take 4 mg by mouth as needed for smoking cessation.   nitroGLYCERIN 0.4 MG SL tablet Commonly known as:  NITROSTAT Place 0.4 mg under the tongue every 5 (five) minutes as needed for chest pain.   pantoprazole 20 MG tablet Commonly known as:  PROTONIX Take 20 mg by mouth 2 (two) times daily.   polyethylene glycol packet Commonly known as:  MIRALAX / GLYCOLAX Take 17 g by mouth daily.   sennosides-docusate sodium 8.6-50 MG tablet Commonly known as:  SENOKOT-S Take 2 tablets by mouth 2 (two) times daily.       Review of Systems  Constitutional: Negative for activity change, appetite change, chills, fatigue and fever.  HENT: Negative for congestion, rhinorrhea, sinus pain, sinus pressure, sneezing and sore throat.   Eyes: Negative.   Respiratory: Negative for cough, chest tightness, shortness of breath and wheezing.   Cardiovascular:  Negative for chest pain, palpitations and leg swelling.  Gastrointestinal: Negative for abdominal distention, abdominal pain, constipation, diarrhea, nausea and vomiting.  Genitourinary: Negative for dysuria, flank pain, frequency and urgency.  Musculoskeletal: Positive for gait problem.  Skin: Negative for color change, pallor and rash.       Right surgical incision drsg managed by wound care Nurse    Neurological: Negative for dizziness, seizures, syncope, light-headedness and headaches.  Hematological: Does not bruise/bleed easily.  Psychiatric/Behavioral: Negative for agitation, confusion, hallucinations and sleep disturbance. The patient is not nervous/anxious.     Immunization History  Administered Date(s) Administered  . PPD Test 08/12/2016, 08/18/2016   Pertinent  Health Maintenance Due  Topic Date Due  . FOOT EXAM  07/15/1962  . OPHTHALMOLOGY EXAM  07/15/1962  . URINE MICROALBUMIN  07/15/1962  . PAP SMEAR  07/15/1973  . MAMMOGRAM  07/15/2002  . COLONOSCOPY  07/15/2002  . INFLUENZA VACCINE  12/24/2016  . HEMOGLOBIN A1C  01/30/2017    Vitals:   08/26/16 1213  BP: 132/84  Pulse: 76  Resp: 18  Temp: 97.7 F (36.5 C)  TempSrc: Oral  SpO2: 97%  Weight: 165 lb 12.8 oz (75.2 kg)  Height: 5\' 2"  (1.575 m)   Body mass index is 30.33 kg/m. Physical Exam  Constitutional: She is oriented to person, place, and time. She appears well-developed and well-nourished. No distress.  HENT:  Head: Normocephalic.  Mouth/Throat: Oropharynx is clear and moist. No oropharyngeal exudate.  Eyes: Conjunctivae and EOM are normal. Pupils are equal, round, and reactive to light. Right eye exhibits no discharge. Left eye exhibits no discharge. No scleral icterus.  Neck: Normal range of motion. No tracheal deviation present. No thyromegaly present.  Cardiovascular: Normal rate, regular rhythm, normal heart sounds and intact distal pulses.  Exam reveals no gallop and no friction rub.   No murmur  heard. Pulmonary/Chest: Effort normal and breath sounds normal. No respiratory distress. She has no wheezes. She has no rales.  Abdominal: Soft. Bowel sounds are normal. She exhibits no distension. There is no tenderness. There is no  rebound and no guarding.  Musculoskeletal:  Unsteady gait. Moves x 4 extremities except right foot limited ROM  Lymphadenopathy:    She has no cervical adenopathy.  Neurological: She is oriented to person, place, and time.  Skin: Skin is warm and dry. No rash noted. No erythema. No pallor.  1. Right foot surgical drsg dry, clean and intact.  2. LUE AV fistula positive without any signs of infections.   Psychiatric: She has a normal mood and affect.    Labs reviewed:  Recent Labs  12/03/15 0525  08/09/16 0524 08/10/16 0455 08/11/16 0224 08/25/16  NA 140  < > 136 135 134* 140  K 3.9  < > 4.0 3.8 3.7 3.8  CL 107  < > 102 101 101  --   CO2 24  < > 23 21* 21*  --   GLUCOSE 140*  < > 48* 163* 197*  --   BUN 58*  < > 55* 55* 55* 31*  CREATININE 3.98*  < > 4.97* 4.79* 4.82* 3.2*  CALCIUM 8.7*  < > 8.7* 8.7* 8.6*  --   MG 2.0  --   --   --   --   --   PHOS  --   --   --   --  4.4  --   < > = values in this interval not displayed.  Recent Labs  03/13/16 1426 08/07/16 2124 08/25/16  AST 17 37 14  ALT 14 11* 9  ALKPHOS 89 65 71  BILITOT 0.8 0.5  --   PROT 7.6 7.1  --   ALBUMIN 3.4* 3.1*  --     Recent Labs  08/07/16 2124  08/09/16 0524 08/10/16 0455 08/11/16 0224 08/25/16  WBC 6.9  < > 5.1 6.2 6.2 7.5  NEUTROABS 4.7  --   --  3.6 3.6  --   HGB 9.2*  < > 8.1* 7.5* 7.5* 9.2*  HCT 26.9*  < > 24.8* 22.2* 22.3* 28*  MCV 89.7  < > 90.2 90.6 90.3  --   PLT 174  < > 146* 165 210 250  < > = values in this interval not displayed. Lab Results  Component Value Date   TSH 1.527 03/17/2016   Lab Results  Component Value Date   HGBA1C 8.3 (H) 07/30/2016   Lab Results  Component Value Date   CHOL  12/04/2009    137        ATP III  CLASSIFICATION:  <200     mg/dL   Desirable  200-239  mg/dL   Borderline High  >=240    mg/dL   High          HDL 29 (L) 12/04/2009   LDLCALC  12/04/2009    72        Total Cholesterol/HDL:CHD Risk Coronary Heart Disease Risk Table                     Men   Women  1/2 Average Risk   3.4   3.3  Average Risk       5.0   4.4  2 X Average Risk   9.6   7.1  3 X Average Risk  23.4   11.0        Use the calculated Patient Ratio above and the CHD Risk Table to determine the patient's CHD Risk.        ATP III CLASSIFICATION (LDL):  <100     mg/dL  Optimal  100-129  mg/dL   Near or Above                    Optimal  130-159  mg/dL   Borderline  160-189  mg/dL   High  >190     mg/dL   Very High   TRIG 180 (H) 12/04/2009   CHOLHDL 4.7 12/04/2009    Assessment/Plan 1. Moderate protein-calorie malnutrition (HCC)  TP 5.7 ( 08/25/2016).Has good appetite.Will consult Registered dietician for protein supplements. Recheck CMP 09/09/2016  2. Hypoalbuminemia ALB 3.16 ( 08/25/2016).consult Registered dietician for protein supplements. Recheck CMP 09/09/2016  3. Chronic renal disease, stage 4, severely decreased glomerular filtration rate between 15-29 mL/min/1.73 square meter  CR 3.23 (08/25/2016) has improved previous 4.82.   4. Anemia of chronic renal failure, stage 4  Hgb 9.2, HCT 28.4 ( 08/25/2016).Previous 7.5 continue to monitor.Recheck CBC 09/09/2016    Family/ staff Communication: Reviewed plan of care with patient and facility Nurse supervisor  Labs/tests ordered: CBC,CMP 09/09/2016   Sandrea Hughs, NP

## 2016-08-28 ENCOUNTER — Ambulatory Visit (INDEPENDENT_AMBULATORY_CARE_PROVIDER_SITE_OTHER): Payer: Medicare Other | Admitting: Orthopedic Surgery

## 2016-08-28 ENCOUNTER — Ambulatory Visit (INDEPENDENT_AMBULATORY_CARE_PROVIDER_SITE_OTHER): Payer: No Typology Code available for payment source

## 2016-08-28 DIAGNOSIS — M79671 Pain in right foot: Secondary | ICD-10-CM

## 2016-08-28 NOTE — Progress Notes (Signed)
Post-Op Visit Note   Patient: Robin [name]           Date of Birth: 05/12/1951           MRN: [id number] Visit Date: 08/28/2016 PCP: [facility]  Chief Complaint: No chief complaint on file.   HPI:  The patient is a 66 year old woman who is 3 weeks status post Lisfranc fusion right foot. She is residing at a skilled nursing for her rehabilitation. She states she has been touchdown weightbearing in her fracture boot with physical therapy thus far.    Ortho Exam Incision is well for her sutures healing well. There is scattered eschar. There is no gaping no drainage no surrounding erythema no swelling.  Visit Diagnoses:  1. Pain in right foot     Plan: sutures Harvested today. She may be weightbearing as tolerated in the fracture boot we have updated orders with physical therapy. She'll follow-up in office in 2 more weeks with repeat radiographs of the right foot.  Follow-Up Instructions: Return in about 2 weeks (around 09/11/2016).   Imaging: Xr Foot Complete Right  Result Date: 08/28/2016 Radiographs of right foot show stable alignment of fixation hardware. The MT -navicular screw appears to be in talo-navicular joint.    Orders:  Orders Placed This Encounter  Procedures  . XR Foot Complete Right   No orders of the defined types were placed in this encounter.    PMFS History: Patient Active Problem List   Diagnosis Date Noted  . SIRS (systemic inflammatory response syndrome) ([place]) 08/08/2016  . CHF (congestive heart failure) ([place]) 08/07/2016  . CAP (community acquired pneumonia) 03/13/2016  . Right leg DVT ([place]) 12/05/2015  . Acute respiratory failure with hypoxemia ([place])   . Charcot foot due to diabetes mellitus ([place])   . Hyperlipidemia 03/26/2015  . Charcot ankle 03/16/2015  . Confusion 01/21/2015  . Anemia 01/21/2015  . Multiple falls 01/21/2015  . Acute on chronic diastolic heart failure ([place]) 01/21/2015  . Anemia, chronic renal failure   .  Ventricular tachycardia ([place])   . Chronic renal disease, stage 4, severely decreased glomerular filtration rate between 15-29 mL/min/1.73 square meter (HCC)   . Anemia in chronic kidney disease 12/09/2014  . CKD (chronic kidney disease) 12/09/2014  . Hypertensive heart/renal disease with failure ([place]) 12/09/2014  . Bipolar affective disorder ([place]) 12/09/2014  . Acute encephalopathy   . Acute delirium 11/18/2014  . Type 2 diabetes mellitus with hyperglycemia ([place]) 11/18/2014  . Essential hypertension 11/18/2014  . Chronic combined systolic and diastolic CHF (congestive heart failure) ([place]) 11/18/2014  . Fracture dislocation of ankle 11/17/2014  . DM type 2, uncontrolled, with renal complications ([place]) 29/51/8841  . Hypokalemia 11/17/2014  . Essential hypertension, benign 11/17/2014  . Obesity 11/17/2014  . CAD (coronary artery disease), native coronary artery with 2 stents  11/17/2014   Past Medical History:  Diagnosis Date  . Anemia   . Anxiety   . Arthritis   . Benign hypertension   . Bipolar disorder ([place])   . CHF (congestive heart failure) ([place])   . Chronic kidney disease, stage V [facility])    Nephrologist is with [facility] (Dr. Tammi Klippel)  . Coronary artery disease   . Depression   . Diabetes mellitus without complication ([place])   . DVT (deep venous thrombosis) (HCC)    right lower leg  . GERD (gastroesophageal reflux disease)   . Heart murmur   . History of blood transfusion   . History of  bronchitis   . History of pneumonia   . Shortness of breath dyspnea   . Sleep apnea   . Type 2 diabetes mellitus (HCC)     Family History  Problem Relation Age of Onset  . Family history unknown: Yes    Past Surgical History:  Procedure Laterality Date  . ABDOMINAL HYSTERECTOMY    . ANKLE CLOSED REDUCTION N/A 11/17/2014   Procedure: CLOSED REDUCTION ANKLE;  Surgeon: Earlie Server, MD;  Location: [place];  Service: Orthopedics;  Laterality: N/A;  . ANKLE FUSION Left 03/16/2015    Procedure: Left Tibiocalcaneal Fusion;  Surgeon: Newt Minion, MD;  Location: [place];  Service: Orthopedics;  Laterality: Left;  . APPLICATION OF WOUND VAC Right 08/06/2016   Procedure: APPLICATION OF PREVENA WOUND VAC;  Surgeon: Newt Minion, MD;  Location: [place];  Service: Orthopedics;  Laterality: Right;  . AV FISTULA PLACEMENT Left [id number]   done at [place]     2 stent   . CHOLECYSTECTOMY    . CORONARY STENT PLACEMENT    . FOOT ARTHRODESIS Right 08/06/2016   Procedure: Right Foot Fusion Lisfranc Joint;  Surgeon: Newt Minion, MD;  Location: [place];  Service: Orthopedics;  Laterality: Right;  . HARDWARE REMOVAL Left 03/16/2015   Procedure: Removal Hardware Left Ankle;  Surgeon: Newt Minion, MD;  Location: [place];  Service: Orthopedics;  Laterality: Left;  . ORIF ANKLE FRACTURE Left 11/20/2014   Procedure: OPEN REDUCTION INTERNAL FIXATION (ORIF) ANKLE FRACTURE;  Surgeon: Renette Butters, MD;  Location: [place];  Service: Orthopedics;  Laterality: Left;  . TONSILLECTOMY     Social History   Occupational History  . Not on file.   Social History Main Topics  . Smoking status: Current Every Day Smoker    Packs/day: 1.00    Types: Cigarettes  . Smokeless tobacco: Never Used  . Alcohol use No  . Drug use: No  . Sexual activity: No

## 2016-08-31 ENCOUNTER — Encounter (HOSPITAL_COMMUNITY): Payer: Self-pay | Admitting: *Deleted

## 2016-08-31 ENCOUNTER — Emergency Department (HOSPITAL_COMMUNITY): Payer: Medicare Other

## 2016-08-31 ENCOUNTER — Emergency Department (HOSPITAL_COMMUNITY)
Admission: EM | Admit: 2016-08-31 | Discharge: 2016-08-31 | Disposition: A | Discharge status: Home / Self Care | Payer: Medicare Other | Attending: Emergency Medicine | Admitting: Emergency Medicine

## 2016-08-31 DIAGNOSIS — N185 Chronic kidney disease, stage 5: Secondary | ICD-10-CM | POA: Diagnosis not present

## 2016-08-31 DIAGNOSIS — I5023 Acute on chronic systolic (congestive) heart failure: Secondary | ICD-10-CM | POA: Diagnosis not present

## 2016-08-31 DIAGNOSIS — Z7982 Long term (current) use of aspirin: Secondary | ICD-10-CM | POA: Insufficient documentation

## 2016-08-31 DIAGNOSIS — R0602 Shortness of breath: Secondary | ICD-10-CM | POA: Diagnosis not present

## 2016-08-31 DIAGNOSIS — R0789 Other chest pain: Secondary | ICD-10-CM

## 2016-08-31 DIAGNOSIS — Z794 Long term (current) use of insulin: Secondary | ICD-10-CM | POA: Insufficient documentation

## 2016-08-31 DIAGNOSIS — R079 Chest pain, unspecified: Secondary | ICD-10-CM | POA: Diagnosis not present

## 2016-08-31 DIAGNOSIS — I2510 Atherosclerotic heart disease of native coronary artery without angina pectoris: Secondary | ICD-10-CM | POA: Insufficient documentation

## 2016-08-31 DIAGNOSIS — F17210 Nicotine dependence, cigarettes, uncomplicated: Secondary | ICD-10-CM | POA: Diagnosis not present

## 2016-08-31 DIAGNOSIS — I132 Hypertensive heart and chronic kidney disease with heart failure and with stage 5 chronic kidney disease, or end stage renal disease: Secondary | ICD-10-CM | POA: Diagnosis not present

## 2016-08-31 DIAGNOSIS — Z79899 Other long term (current) drug therapy: Secondary | ICD-10-CM | POA: Insufficient documentation

## 2016-08-31 DIAGNOSIS — E1122 Type 2 diabetes mellitus with diabetic chronic kidney disease: Secondary | ICD-10-CM | POA: Insufficient documentation

## 2016-08-31 DIAGNOSIS — Z955 Presence of coronary angioplasty implant and graft: Secondary | ICD-10-CM | POA: Insufficient documentation

## 2016-08-31 LAB — CBC
HEMATOCRIT: 29 % — AB (ref 36.0–46.0)
Hemoglobin: 9.5 g/dL — ABNORMAL LOW (ref 12.0–15.0)
MCH: 29.8 pg (ref 26.0–34.0)
MCHC: 32.8 g/dL (ref 30.0–36.0)
MCV: 90.9 fL (ref 78.0–100.0)
PLATELETS: 232 10*3/uL (ref 150–400)
RBC: 3.19 MIL/uL — ABNORMAL LOW (ref 3.87–5.11)
RDW: 16.5 % — AB (ref 11.5–15.5)
WBC: 7 10*3/uL (ref 4.0–10.5)

## 2016-08-31 LAB — I-STAT TROPONIN, ED: TROPONIN I, POC: 0.04 ng/mL (ref 0.00–0.08)

## 2016-08-31 LAB — BASIC METABOLIC PANEL
Anion gap: 11 (ref 5–15)
BUN: 27 mg/dL — AB (ref 6–20)
CHLORIDE: 112 mmol/L — AB (ref 101–111)
CO2: 20 mmol/L — AB (ref 22–32)
CREATININE: 3.22 mg/dL — AB (ref 0.44–1.00)
Calcium: 9.3 mg/dL (ref 8.9–10.3)
GFR calc Af Amer: 16 mL/min — ABNORMAL LOW (ref >60)
GFR calc non Af Amer: 14 mL/min — ABNORMAL LOW (ref >60)
GLUCOSE: 56 mg/dL — AB (ref 65–99)
POTASSIUM: 3.3 mmol/L — AB (ref 3.5–5.1)
Sodium: 143 mmol/L (ref 135–145)

## 2016-08-31 LAB — BRAIN NATRIURETIC PEPTIDE: B Natriuretic Peptide: 1074.1 pg/mL — ABNORMAL HIGH (ref 0.0–100.0)

## 2016-08-31 LAB — TROPONIN I: TROPONIN I: 0.04 ng/mL — AB (ref <0.03)

## 2016-08-31 MED ORDER — FUROSEMIDE 10 MG/ML IJ SOLN
80.0000 mg | Freq: Once | INTRAMUSCULAR | Status: AC
  Administered 2016-08-31: 80 mg via INTRAVENOUS
  Filled 2016-08-31: qty 8

## 2016-08-31 MED ORDER — BUMETANIDE 2 MG PO TABS: 6.0000 mg | ORAL_TABLET | Freq: Two times a day (BID) | ORAL | 0 refills | Status: DC

## 2016-08-31 NOTE — ED Notes (Signed)
Pt verbalized understanding discharge instructions and denies any further needs or questions at this time. VS stable, ambulatory and steady gait.   

## 2016-08-31 NOTE — ED Provider Notes (Signed)
[facility] DEPT Provider Note   CSN: [id number] Arrival date & time: 08/31/16  1010     History   Chief Complaint Chief Complaint  Patient presents with  . Chest Pain    HPI Robin [name] is a 66 y.o. female.  HPI Patient presents with concern of chest pain. Patient is currently asymptomatic, but notes that over the past 12 hours has had 2 episodes of chest pain. She was at rest during both of these episodes. The initial episode was momentary, sharp, sternal, nonradiating. Second episode was just prior to EMS notification, was pressure-like, also sternal, also nonradiating. There is no concurrent dyspnea, no syncope, nausea. Patient notes ongoing mild cough, states that she is otherwise doing generally well, recovering from recent orthopedic procedure to her right foot. Patient does have a history of CAD, CHF She denies other noticeable changes in her medical condition. She did receive nitroglycerin after the second episode of chest pain, has some suspicion that that made her symptoms better.  Past Medical History:  Diagnosis Date  . Anemia   . Anxiety   . Arthritis   . Benign hypertension   . Bipolar disorder ([place])   . CHF (congestive heart failure) ([place])   . Chronic kidney disease, stage [name])    Nephrologist is with [facility] (Dr. Tammi Klippel)  . Coronary artery disease   . Depression   . Diabetes mellitus without complication ([place])   . DVT (deep venous thrombosis) (HCC)    right lower leg  . GERD (gastroesophageal reflux disease)   . Heart murmur   . History of blood transfusion   . History of bronchitis   . History of pneumonia   . Shortness of breath dyspnea   . Sleep apnea   . Type 2 diabetes mellitus [facility])     Patient Active Problem List   Diagnosis Date Noted  . SIRS (systemic inflammatory response syndrome) ([place]) 08/08/2016  . CHF (congestive heart failure) ([place]) 08/07/2016  . CAP (community acquired pneumonia) 03/13/2016  . Right leg DVT ([place])  12/05/2015  . Acute respiratory failure with hypoxemia ([place])   . Charcot foot due to diabetes mellitus ([place])   . Hyperlipidemia 03/26/2015  . Charcot ankle 03/16/2015  . Confusion 01/21/2015  . Anemia 01/21/2015  . Multiple falls 01/21/2015  . Acute on chronic diastolic heart failure ([place]) 01/21/2015  . Anemia, chronic renal failure   . Ventricular tachycardia ([place])   . Chronic renal disease, stage 4, severely decreased glomerular filtration rate between 15-29 mL/min/1.73 square meter (HCC)   . Anemia in chronic kidney disease 12/09/2014  . CKD (chronic kidney disease) 12/09/2014  . Hypertensive heart/renal disease with failure ([place]) 12/09/2014  . Bipolar affective disorder ([place]) 12/09/2014  . Acute encephalopathy   . Acute delirium 11/18/2014  . Type 2 diabetes mellitus with hyperglycemia ([place]) 11/18/2014  . Essential hypertension 11/18/2014  . Chronic combined systolic and diastolic CHF (congestive heart failure) ([place]) 11/18/2014  . Fracture dislocation of ankle 11/17/2014  . DM type 2, uncontrolled, with renal complications ([place]) 09/62/8366  . Hypokalemia 11/17/2014  . Essential hypertension, benign 11/17/2014  . Obesity 11/17/2014  . CAD (coronary artery disease), native coronary artery with 2 stents  11/17/2014    Past Surgical History:  Procedure Laterality Date  . ABDOMINAL HYSTERECTOMY    . ANKLE CLOSED REDUCTION N/A 11/17/2014   Procedure: CLOSED REDUCTION ANKLE;  Surgeon: Earlie Server, MD;  Location: [facility];  Service: Orthopedics;  Laterality: N/A;  . ANKLE FUSION Left  03/16/2015   Procedure: Left Tibiocalcaneal Fusion;  Surgeon: Newt Minion, MD;  Location: [facility];  Service: Orthopedics;  Laterality: Left;  . APPLICATION OF WOUND VAC Right 08/06/2016   Procedure: APPLICATION OF PREVENA WOUND VAC;  Surgeon: Newt Minion, MD;  Location: [place];  Service: Orthopedics;  Laterality: Right;  . AV FISTULA PLACEMENT Left [id number]   done at [facility]     2 stent   . CHOLECYSTECTOMY    . CORONARY STENT PLACEMENT    . FOOT ARTHRODESIS Right 08/06/2016   Procedure: Right Foot Fusion Lisfranc Joint;  Surgeon: Newt Minion, MD;  Location: [facility];  Service: Orthopedics;  Laterality: Right;  . HARDWARE REMOVAL Left 03/16/2015   Procedure: Removal Hardware Left Ankle;  Surgeon: Newt Minion, MD;  Location: [facility] [place];  Service: Orthopedics;  Laterality: Left;  . ORIF ANKLE FRACTURE Left 11/20/2014   Procedure: OPEN REDUCTION INTERNAL FIXATION (ORIF) ANKLE FRACTURE;  Surgeon: Renette Butters, MD;  Location: [place];  Service: Orthopedics;  Laterality: Left;  . TONSILLECTOMY      OB History    No data available       Home Medications    Prior to Admission medications   Medication Sig Start Date End Date Taking? Authorizing Provider  acetaminophen (TYLENOL) 325 MG tablet Take 650 mg by mouth every 4 (four) hours as needed for mild pain.    Historical Provider, MD  amLODipine (NORVASC) 10 MG tablet Take 10 mg by mouth daily.     Historical Provider, MD  ammonium lactate (LAC-HYDRIN) 12 % lotion Apply 1 application topically 2 (two) times daily.    Historical Provider, MD  ARIPiprazole (ABILIFY) 15 MG tablet Take 7.5 mg by mouth daily.    Historical Provider, MD  ARIPiprazole (ABILIFY) 5 MG tablet Take 2.5 mg by mouth at bedtime as needed.    Historical Provider, MD  aspirin EC 81 MG tablet Take 81 mg by mouth daily.    Historical Provider, MD  atorvastatin (LIPITOR) 80 MG tablet Take 40 mg by mouth at bedtime.     Historical Provider, MD  benzoyl peroxide 10 % gel Apply 1 application topically daily.    Historical Provider, MD  bumetanide (BUMEX) 2 MG tablet Take 4 mg by mouth 2 (two) times daily.     Historical Provider, MD  carvedilol (COREG) 25 MG tablet Take 25 mg by mouth 2 (two) times daily with a meal.    Historical Provider, MD  Cholecalciferol 50000 units capsule Take 50,000 Units by mouth once a week.    Historical  Provider, MD  clindamycin (CLEOCIN T) 1 % lotion Apply 1 application topically daily. Apply axilla, groin and buttock    Historical Provider, MD  Darbepoetin Alfa (ARANESP) 60 MCG/0.3ML SOSY injection Inject 60 mcg into the skin every 28 (twenty-eight) days.    Historical Provider, MD  dextrose (GLUTOSE) 40 % GEL Take 1 Tube by mouth as needed for low blood sugar.    Historical Provider, MD  ferrous sulfate 325 (65 FE) MG tablet Take 325 mg by mouth 2 (two) times daily with a meal.    Historical Provider, MD  gabapentin (NEURONTIN) 100 MG capsule Take 100 mg by mouth at bedtime.    Historical Provider, MD  hydrALAZINE (APRESOLINE) 50 MG tablet Take 50 mg by mouth every 8 (eight) hours.    Historical Provider, MD  insulin detemir (LEVEMIR) 100 UNIT/ML injection Inject 12 Units into the  skin at bedtime.    Historical Provider, MD  insulin lispro (HUMALOG) 100 UNIT/ML injection Inject into the skin 3 (three) times daily before meals. Inject per sliding scale: 150-200= 2 units, 201-250= 4 units, 251-300= 6 units, 301-350= 8 units,351-400= 10 units and call MD    Historical Provider, MD  insulin lispro (HUMALOG) 100 UNIT/ML injection Inject 6 Units into the skin 3 (three) times daily before meals.    Historical Provider, MD  isosorbide mononitrate (IMDUR) 60 MG 24 hr tablet Take 60 mg by mouth daily.     Historical Provider, MD  lidocaine (LMX) 4 % cream Apply 1 application topically 3 (three) times daily.    Historical Provider, MD  Melatonin 3 MG TABS Take 6 mg by mouth at bedtime.    Historical Provider, MD  nicotine (NICODERM CQ - DOSED IN MG/24 HOURS) 14 mg/24hr patch Place 14 mg onto the skin daily. Start on 09/30/16. Stop date 11/11/16    Historical Provider, MD  nicotine (NICODERM CQ - DOSED IN MG/24 HOURS) 21 mg/24hr patch Place 21 mg onto the skin daily. Stop date 09/29/16    Historical Provider, MD  nicotine (NICODERM CQ - DOSED IN MG/24 HR) 7 mg/24hr patch Place 7 mg onto the skin daily. Start on  11/12/16    Historical Provider, MD  nicotine polacrilex (COMMIT) 4 MG lozenge Take 4 mg by mouth as needed for smoking cessation.    Historical Provider, MD  nitroGLYCERIN (NITROSTAT) 0.4 MG SL tablet Place 0.4 mg under the tongue every 5 (five) minutes as needed for chest pain.    Historical Provider, MD  pantoprazole (PROTONIX) 20 MG tablet Take 20 mg by mouth 2 (two) times daily.     Historical Provider, MD  polyethylene glycol (MIRALAX / GLYCOLAX) packet Take 17 g by mouth daily.    Historical Provider, MD  sennosides-docusate sodium (SENOKOT-S) 8.6-50 MG tablet Take 2 tablets by mouth 2 (two) times daily.    Historical Provider, MD    Family History Family History  Problem Relation Age of Onset  . Family history unknown: Yes    Social History Social History  Substance Use Topics  . Smoking status: Current Every Day Smoker    Packs/day: 1.00    Types: Cigarettes  . Smokeless tobacco: Never Used  . Alcohol use No     Allergies   Patient has no known allergies.   Review of Systems Review of Systems  Constitutional:       Per HPI, otherwise negative  HENT:       Per HPI, otherwise negative  Respiratory:       Per HPI, otherwise negative  Cardiovascular:       Per HPI, otherwise negative  Gastrointestinal: Negative for vomiting.  Endocrine:       Negative aside from HPI  Genitourinary:       Neg aside from HPI   Musculoskeletal:       Per HPI, otherwise negative  Skin: Negative.   Neurological: Negative for syncope.     Physical Exam Updated Vital Signs BP 136/69 (BP Location: Right Arm)   Pulse 72   Temp 98.4 F (36.9 C) (Oral)   Resp 18   SpO2 96%   Physical Exam  Constitutional: She is oriented to person, place, and time. She appears well-developed and well-nourished. No distress.  HENT:  Head: Normocephalic and atraumatic.  Eyes: Conjunctivae and EOM are normal.  Cardiovascular: Normal rate and regular rhythm.   Pulmonary/Chest: Effort normal  and  breath sounds normal. No stridor. No respiratory distress. She exhibits tenderness.  Tenderness to palpation about the mid sternal, no deformity.  Abdominal: She exhibits no distension.  Musculoskeletal: She exhibits no edema.  Right foot with postsurgical incision on the dorsum, flexion, extension within normal limits.   Neurological: She is alert and oriented to person, place, and time. No cranial nerve deficit.  Skin: Skin is warm and dry.  Psychiatric: She has a normal mood and affect.  Nursing note and vitals reviewed.    ED Treatments / Results  Labs (all labs ordered are listed, but only abnormal results are displayed) Labs Reviewed  BASIC METABOLIC PANEL - Abnormal; Notable for the following:       Result Value   Potassium 3.3 (*)    Chloride 112 (*)    CO2 20 (*)    Glucose, Bld 56 (*)    BUN 27 (*)    Creatinine, Ser 3.22 (*)    GFR calc non Af Amer 14 (*)    GFR calc Af Amer 16 (*)    All other components within normal limits  CBC - Abnormal; Notable for the following:    RBC 3.19 (*)    Hemoglobin 9.5 (*)    HCT 29.0 (*)    RDW 16.5 (*)    All other components within normal limits  BRAIN NATRIURETIC PEPTIDE - Abnormal; Notable for the following:    B Natriuretic Peptide 1,074.1 (*)    All other components within normal limits  TROPONIN I - Abnormal; Notable for the following:    Troponin I 0.04 (*)    All other components within normal limits  I-STAT TROPOININ, ED    EKG  EKG Interpretation  Date/Time:  Sunday August 31 2016 10:21:42 EDT Ventricular Rate:  98 PR Interval:    QRS Duration: 90 QT Interval:  422 QTC Calculation: 475 R Axis:   -22 Text Interpretation:  Sinus arrhythmia Multiform ventricular premature complexes T wave abnormality Artifact Abnormal ekg Confirmed by Carmin Muskrat  MD ([id number]) on 08/31/2016 10:29:48 AM       Radiology Dg Chest 2 View  Result Date: 08/31/2016 CLINICAL DATA:  Shortness of breath, left chest pain EXAM: CHEST   2 VIEW COMPARISON:  08/09/2016 FINDINGS: Small left pleural effusion. Mild left basilar opacity, likely atelectasis. No frank interstitial edema. No pneumothorax. Cardiomegaly. IMPRESSION: Cardiomegaly.  No frank interstitial edema. Small left pleural effusion. Mild left basilar opacity, likely atelectasis. Electronically Signed   By: Julian Hy M.D.   On: 08/31/2016 11:08    Procedures Procedures (including critical care time)  Medications Ordered in ED Medications  furosemide (LASIX) injection 80 mg (80 mg Intravenous Given 08/31/16 1409)   3:55 PM Patient has had no additional episodes of chest pain, states that she feels better. Second troponin is the same as the first, and she has chronically elevated troponin. Patient has known kidney disease, and creatinine today is similar to prior studies, likely contributing to her baseline mildly elevated troponin.  With no subsequent change in her troponin, no ongoing chest pain, there is low suspicion for new unstable angina, ACS. High suspicion for heart failure exacerbation. Patient amenable to discharge with increased diuretics, outpatient cardiology follow-up.    Initial Impression / Assessment and Plan / ED Course  I have reviewed the triage vital signs and the nursing notes.  Pertinent labs & imaging results that were available during my care of the patient were reviewed by me and considered  in my medical decision making (see chart for details).   Final Clinical Impressions(s) / ED Diagnoses  Atypical chest pain Heart failure exacerbation   Carmin Muskrat, MD 08/31/16 1557

## 2016-08-31 NOTE — ED Triage Notes (Signed)
Pt arrived by [facility], had procedure done to her foot two weeks ago. Had onset of mid chest pain last night, intermittent. Had nausea today, denies sob. Received 3 nitro at NH, 1 nitro and 324mg  ASA by ems.

## 2016-08-31 NOTE — Discharge Instructions (Signed)
As discussed, with today's findings, no deformity monitor your condition carefully, and follow-up with your cardiologist. For the next 5 days please use the new amount of Bumex, as prescribed.

## 2016-09-01 ENCOUNTER — Inpatient Hospital Stay (INDEPENDENT_AMBULATORY_CARE_PROVIDER_SITE_OTHER): Payer: Medicare Other | Admitting: Orthopedic Surgery

## 2016-09-03 ENCOUNTER — Encounter: Payer: Self-pay | Admitting: Family

## 2016-09-03 ENCOUNTER — Non-Acute Institutional Stay (SKILLED_NURSING_FACILITY): Payer: Medicare Other | Admitting: Family

## 2016-09-03 DIAGNOSIS — E782 Mixed hyperlipidemia: Secondary | ICD-10-CM | POA: Diagnosis not present

## 2016-09-03 DIAGNOSIS — E1165 Type 2 diabetes mellitus with hyperglycemia: Secondary | ICD-10-CM | POA: Diagnosis not present

## 2016-09-03 DIAGNOSIS — R2681 Unsteadiness on feet: Secondary | ICD-10-CM | POA: Diagnosis not present

## 2016-09-03 DIAGNOSIS — D631 Anemia in chronic kidney disease: Secondary | ICD-10-CM | POA: Diagnosis not present

## 2016-09-03 DIAGNOSIS — E11610 Type 2 diabetes mellitus with diabetic neuropathic arthropathy: Secondary | ICD-10-CM

## 2016-09-03 DIAGNOSIS — N183 Chronic kidney disease, stage 3 unspecified: Secondary | ICD-10-CM

## 2016-09-03 DIAGNOSIS — I5042 Chronic combined systolic (congestive) and diastolic (congestive) heart failure: Secondary | ICD-10-CM | POA: Diagnosis not present

## 2016-09-03 DIAGNOSIS — E1121 Type 2 diabetes mellitus with diabetic nephropathy: Secondary | ICD-10-CM | POA: Diagnosis not present

## 2016-09-03 DIAGNOSIS — I130 Hypertensive heart and chronic kidney disease with heart failure and stage 1 through stage 4 chronic kidney disease, or unspecified chronic kidney disease: Secondary | ICD-10-CM

## 2016-09-03 DIAGNOSIS — IMO0001 Reserved for inherently not codable concepts without codable children: Secondary | ICD-10-CM

## 2016-09-03 NOTE — Progress Notes (Signed)
Location:  [place] Room Number: [id number] Place of Service:  SNF (31)  Provider: Marlowe Sax FNP-C   PCP: [name] Patient Care Team: [facility] as PCP - General (General Practice)  Extended Emergency Contact Information Primary Emergency Contact: Deberry,[name] Address: [address]          [place], [place] [address] [place] Phone: [phone] Relation: Daughter Secondary Emergency Contact: Peyton Bottoms [place] Mobile Phone: [phone] Relation: Friend  Code Status: Full code  Goals of care:  Advanced Directive information Advanced Directives 08/26/2016  Does Patient Have a Medical Advance Directive? No  Type of Advance Directive -  Does patient want to make changes to medical advance directive? No - Patient declined  Copy of [place] in Chart? -  Would patient like information on creating a medical advance directive? -  Pre-existing out of facility DNR order (yellow form or pink MOST form) -     No Known Allergies  Chief Complaint  Patient presents with  . Discharge Note    Discharge from [facility]     HPI:  66 y.o. female seen today at [facility] for discharge home.she was here for short term rehabilitation for post hospital admission from 08/13/2016-08/18/2016 with acute encephalopathy thought to be from hypoactive to the AVM. Infectious etiology were ruled out. CT of brain was unremarkable for acute abnormality. She was seen by Psychiatry and her Seroquel and fluphenazine was wean off. Her coumadin was also discontinued due to completion of treatment for DVT. She required unit of PRBC transfusion during hospital admission.she has     a medical history of HTN, CAD, CHF, CKD stage 4, Anemia, Bipolar among other conditions. She is seen in her room today.she complains of right upper back " small bump" states has been  there for couple of weeks. She denies any pain itching or discomfort to area. She further denies any fever, chills or drainage. Recommend follow up with outpatient dermatology. She has had unremarkable stay here in rehab. Her recent Hgb 9.5 (08/31/2016). She was seen by [facility] 08/28/2016 right foot healing well.Orders given to advance weight bearing in fracture boot as tolerated and follow up in two weeks. She has worked well with PT/OT now stable for discharge home.She will be discharged home with Home health PT/OT to continue with ROM, Exercise, Gait stability and muscle strengthening. She does not require any DME. Home health services will be arranged by facility social worker prior to discharge. Prescription medication will be written x 1 month then patient to follow up with PCP in 1-2 weeks. Facility staff report no new concerns.   Past Medical History:  Diagnosis Date  . Anemia   . Anxiety   . Arthritis   . Benign hypertension   . Bipolar disorder ([place])   . CHF (congestive heart failure) ([place])   . Chronic kidney disease, stage V [facility])    Nephrologist is with [facility] (Dr. Tammi Klippel)  . Coronary artery disease   . Depression   . Diabetes mellitus without complication ([place])   . DVT (deep venous thrombosis) ([facility])    right lower leg  . GERD (gastroesophageal reflux disease)   . Heart murmur   . History of blood transfusion   . History of bronchitis   . History of pneumonia   . Shortness of breath dyspnea   . Sleep apnea   .  Type 2 diabetes mellitus ([place])     Past Surgical History:  Procedure Laterality Date  . ABDOMINAL HYSTERECTOMY    . ANKLE CLOSED REDUCTION N/A 11/17/2014   Procedure: CLOSED REDUCTION ANKLE;  Surgeon: Earlie Server, MD;  Location: [place];  Service: Orthopedics;  Laterality: N/A;  . ANKLE FUSION Left 03/16/2015   Procedure: Left Tibiocalcaneal Fusion;  Surgeon: Newt Minion, MD;  Location: [facility];  Service: Orthopedics;  Laterality: Left;  . APPLICATION OF  WOUND VAC Right 08/06/2016   Procedure: APPLICATION OF PREVENA WOUND VAC;  Surgeon: Newt Minion, MD;  Location: [place];  Service: Orthopedics;  Laterality: Right;  . AV FISTULA PLACEMENT Left [id number]   done at [facility]     2 stent   . CHOLECYSTECTOMY    . CORONARY STENT PLACEMENT    . FOOT ARTHRODESIS Right 08/06/2016   Procedure: Right Foot Fusion Lisfranc Joint;  Surgeon: Newt Minion, MD;  Location: [facility];  Service: Orthopedics;  Laterality: Right;  . HARDWARE REMOVAL Left 03/16/2015   Procedure: Removal Hardware Left Ankle;  Surgeon: Newt Minion, MD;  Location: [facility];  Service: Orthopedics;  Laterality: Left;  . ORIF ANKLE FRACTURE Left 11/20/2014   Procedure: OPEN REDUCTION INTERNAL FIXATION (ORIF) ANKLE FRACTURE;  Surgeon: Renette Butters, MD;  Location: [place];  Service: Orthopedics;  Laterality: Left;  . TONSILLECTOMY        reports that she has been smoking Cigarettes.  She has been smoking about 1.00 pack per day. She has never used smokeless tobacco. She reports that she does not drink alcohol or use drugs. Social History   Social History  . Marital status: Divorced    Spouse name: N/A  . Number of children: N/A  . Years of education: N/A   Occupational History  . Not on file.   Social History Main Topics  . Smoking status: Current Every Day Smoker    Packs/day: 1.00    Types: Cigarettes  . Smokeless tobacco: Never Used  . Alcohol use No  . Drug use: No  . Sexual activity: No   Other Topics Concern  . Not on file   Social History Narrative  . No narrative on file   No Known Allergies  Pertinent  Health Maintenance Due  Topic Date Due  . FOOT EXAM  07/15/1962  . OPHTHALMOLOGY EXAM  07/15/1962  . URINE MICROALBUMIN  07/15/1962  . PAP SMEAR  07/15/1973  . MAMMOGRAM  07/15/2002  . COLONOSCOPY  07/15/2002  . INFLUENZA VACCINE  12/24/2016  . HEMOGLOBIN A1C  01/30/2017    Medications: Allergies as of 09/03/2016   No Known  Allergies     Medication List       Accurate as of 09/03/16  3:52 PM. Always use your most recent med list.          acetaminophen 325 MG tablet Commonly known as:  TYLENOL Take 650 mg by mouth every 4 (four) hours as needed for mild pain.   amLODipine 10 MG tablet Commonly known as:  NORVASC Take 10 mg by mouth daily.   ammonium lactate 12 % lotion Commonly known as:  LAC-HYDRIN Apply 1 application topically 2 (two) times daily.   ARIPiprazole 5 MG tablet Commonly known as:  ABILIFY Take 2.5 mg by mouth at bedtime as needed. For agitation   ARIPiprazole 15 MG tablet Commonly known as:  ABILIFY Take 7.5 mg by mouth daily.   aspirin EC  81 MG tablet Take 81 mg by mouth daily.   atorvastatin 40 MG tablet Commonly known as:  LIPITOR Take 40 mg by mouth at bedtime.   benzoyl peroxide 10 % gel Apply 1 application topically daily.   bumetanide 2 MG tablet Commonly known as:  BUMEX Take 6 mg by mouth 2 (two) times daily.   carvedilol 25 MG tablet Commonly known as:  COREG Take 25 mg by mouth 2 (two) times daily with a meal.   Cholecalciferol 50000 units capsule Take 50,000 Units by mouth every Monday.   clindamycin 1 % lotion Commonly known as:  CLEOCIN T Apply 1 application topically daily. Apply axilla, groin and buttock   dextrose 40 % Gel Commonly known as:  GLUTOSE Take 1 Tube by mouth as needed for low blood sugar (<60).   ferrous sulfate 325 (65 FE) MG tablet Take 325 mg by mouth 2 (two) times daily with a meal.   gabapentin 100 MG capsule Commonly known as:  NEURONTIN Take 100 mg by mouth at bedtime.   hydrALAZINE 50 MG tablet Commonly known as:  APRESOLINE Take 50 mg by mouth every 8 (eight) hours.   insulin detemir 100 UNIT/ML injection Commonly known as:  LEVEMIR Inject 12 Units into the skin at bedtime.   insulin lispro 100 UNIT/ML injection Commonly known as:  HUMALOG Inject 6 Units into the skin 3 (three) times daily before meals.  Inject per sliding scale: 150-200= 2 units, 201-250= 4 units, 251-300= 6 units, 301-350= 8 units,351-400= 10 units and call MD   isosorbide mononitrate 60 MG 24 hr tablet Commonly known as:  IMDUR Take 60 mg by mouth daily.   lidocaine 4 % cream Commonly known as:  LMX Apply 1 application topically 3 (three) times daily.   MELATONIN PO Take 6 mg by mouth at bedtime.   nicotine 21 mg/24hr patch Commonly known as:  NICODERM CQ - dosed in mg/24 hours Place 21 mg onto the skin daily. Stop date 09/29/16   nitroGLYCERIN 0.4 MG SL tablet Commonly known as:  NITROSTAT Place 0.4 mg under the tongue every 5 (five) minutes as needed for chest pain.   pantoprazole 20 MG tablet Commonly known as:  PROTONIX Take 20 mg by mouth 2 (two) times daily.   polyethylene glycol packet Commonly known as:  MIRALAX / GLYCOLAX Take 17 g by mouth daily. Hold for loose stool   sennosides-docusate sodium 8.6-50 MG tablet Commonly known as:  SENOKOT-S Take 2 tablets by mouth 2 (two) times daily.       Review of Systems  Vitals:   09/03/16 1413  BP: 138/78  Pulse: 75  Resp: 19  Temp: (!) 96.7 F (35.9 C)  TempSrc: Oral  SpO2: 94%  Weight: 165 lb (74.8 kg)  Height: 5\' 2"  (1.575 m)   Body mass index is 30.18 kg/m. Physical Exam  Labs reviewed: Basic Metabolic Panel:  Recent Labs  12/03/15 0525  08/10/16 0455 08/11/16 0224 08/25/16 08/31/16 1041  NA 140  < > 135 134* 140 143  K 3.9  < > 3.8 3.7 3.8 3.3*  CL 107  < > 101 101  --  112*  CO2 24  < > 21* 21*  --  20*  GLUCOSE 140*  < > 163* 197*  --  56*  BUN 58*  < > 55* 55* 31* 27*  CREATININE 3.98*  < > 4.79* 4.82* 3.2* 3.22*  CALCIUM 8.7*  < > 8.7* 8.6*  --  9.3  MG 2.0  --   --   --   --   --   PHOS  --   --   --  4.4  --   --   < > = values in this interval not displayed. Liver Function Tests:  Recent Labs  03/13/16 1426 08/07/16 2124 08/25/16  AST 17 37 14  ALT 14 11* 9  ALKPHOS 89 65 71  BILITOT 0.8 0.5  --   PROT  7.6 7.1  --   ALBUMIN 3.4* 3.1*  --    CBC:  Recent Labs  08/07/16 2124  08/10/16 0455 08/11/16 0224 08/25/16 08/31/16 1041  WBC 6.9  < > 6.2 6.2 7.5 7.0  NEUTROABS 4.7  --  3.6 3.6  --   --   HGB 9.2*  < > 7.5* 7.5* 9.2* 9.5*  HCT 26.9*  < > 22.2* 22.3* 28* 29.0*  MCV 89.7  < > 90.6 90.3  --  90.9  PLT 174  < > 165 210 250 232  < > = values in this interval not displayed. Cardiac Enzymes:  Recent Labs  08/08/16 0844 08/08/16 1647 08/31/16 1414  TROPONINI 0.09* 0.11* 0.04*    Recent Labs  08/11/16 2127 08/12/16 0739 08/12/16 1149  GLUCAP 238* 184* 195*   Procedures and Imaging Studies During Stay: Dg Chest 2 View  Result Date: 08/31/2016 CLINICAL DATA:  Shortness of breath, left chest pain EXAM: CHEST  2 VIEW COMPARISON:  08/09/2016 FINDINGS: Small left pleural effusion. Mild left basilar opacity, likely atelectasis. No frank interstitial edema. No pneumothorax. Cardiomegaly. IMPRESSION: Cardiomegaly.  No frank interstitial edema. Small left pleural effusion. Mild left basilar opacity, likely atelectasis. Electronically Signed   By: Julian Hy M.D.   On: 08/31/2016 11:08   Dg Chest 2 View  Result Date: 08/09/2016 CLINICAL DATA:  Shortness of breath and productive cough.  Smoker. EXAM: CHEST  2 VIEW COMPARISON:  08/07/2016. FINDINGS: Stable enlarged cardiac silhouette. Progressive diffuse peribronchial thickening. Interval small amount of pleural thickening or fluid at the left lateral lung base. No acute bony abnormality. IMPRESSION: 1. Progressive bronchitic changes. 2. Interval small amount of left lateral pleural thickening or fluid. 3. Stable cardiomegaly. Electronically Signed   By: Claudie Revering M.D.   On: 08/09/2016 11:11   Dg Chest 2 View  Result Date: 08/07/2016 CLINICAL DATA:  66 year old female with shortness of breath. EXAM: CHEST  2 VIEW COMPARISON:  Chest radiograph dated 03/13/2016 FINDINGS: There is moderate cardiomegaly with mildly prominent central  vasculature and mild cephalization concerning for mild vascular congestion. No pulmonary edema. There is no focal consolidation, pleural effusion, or pneumothorax. No acute osseous pathology identified. IMPRESSION: Moderate cardiomegaly with pulmonary vascular congestion. No pulmonary edema or focal consolidation. Electronically Signed   By: Anner Crete M.D.   On: 08/07/2016 19:54   Xr Foot Complete Right  Result Date: 08/28/2016 Radiographs of right foot show stable alignment of fixation hardware. The MT -navicular screw appears to be in talo-navicular joint.    Assessment/Plan:   1. Unsteady gait  Has worked well with PT/ OT. Will discharge home with PT/OT to continue with ROM, Exercise, Gait stability and muscle strengthening. No DME required. Fall and safety precautions.  2. Hypertensive heart/renal disease with failure B/p stable. Continue on Amlodipine, carvedilol, Imdur and Hydralazine. BMP in 1-2 weeks with PCP.   3. Chronic combined systolic and diastolic CHF (congestive heart failure) Appears compensated. No recent abrupt weight gain. Continue on  Amlodipine, carvedilol, Imdur,  Bumex and Hydralazine.continue 1.5 Liters fluid restrictions. Continue daily weight.  4. Uncontrolled type 2 diabetes mellitus with diabetic nephropathy, unspecified long term insulin use status Recent Hgb A1C 7.7 ( 08/25/2016). CBG's ranging 90's-200's.continue on Humalog 6 units three times daily before meals, Per SSI and Levemir 12 units at bedtime. Continue on ASA and atorvastatin. Annual eye and foot exam; urine Microalbuminuria deferred to PCP.    5. Charcot foot due to diabetes mellitus  status post open reduction and internal fixation on 08/06/2016. Incision healed.  seen by [facility] 08/28/2016 right foot healing well.Orders given to advance weight bearing in fracture boot as tolerated and follow up in two weeks.continue to follow up with Ortho as directed.Pain under controlled.    6. Anemia of  chronic renal failure, stage 3 Required one unit of PRBC during hospitalization dure to acute bleeding. Coumadin discontinued.Hgb has improved Hgb 9.5 (08/31/2016).continue ferrous sulfate. Repeat CBC in 1-2 weeks with PCP.   7. Mixed hyperlipidemia Continue on atorvastatin 40 mg Tablet daily.lipid panel with PCP.    Patient is being discharged with the following home health services:   -PT/OT for ROM, exercise, gait stability and muscle strengthening  Patient is being discharged with the following durable medical equipment:   - No DME required.  Patient has been advised to f/u with their PCP in 1-2 weeks to for a transitions of care visit.Social services at their facility was responsible for arranging this appointment.  Pt was provided with adequate prescriptions of noncontrolled medications to reach the scheduled appointment.For controlled substances, a limited supply was provided as appropriate for the individual patient. If the pt normally receives these medications from a pain clinic or has a contract with another physician, these medications should be received from that clinic or physician only).    Future labs/tests needed:  CBC, BMP in 1-2 weeks PCP

## 2016-09-05 IMAGING — DX DG FOREARM 2V*R*
2 series · 2 of 2 positions shown · non-contrast
Comparison: None.

CLINICAL DATA: Mid forearm pain and swelling since 01/07/2016

EXAM:
RIGHT FOREARM - 2 VIEW

[forearm ap]
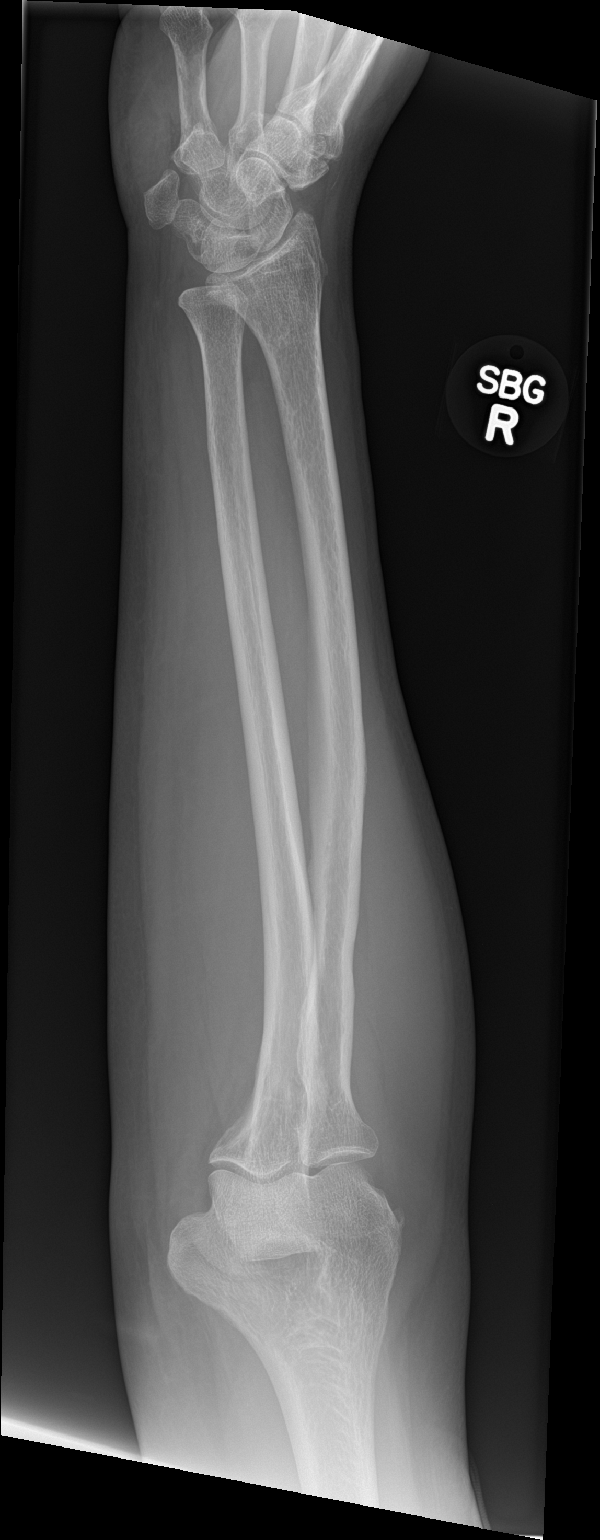

[forearm lat]
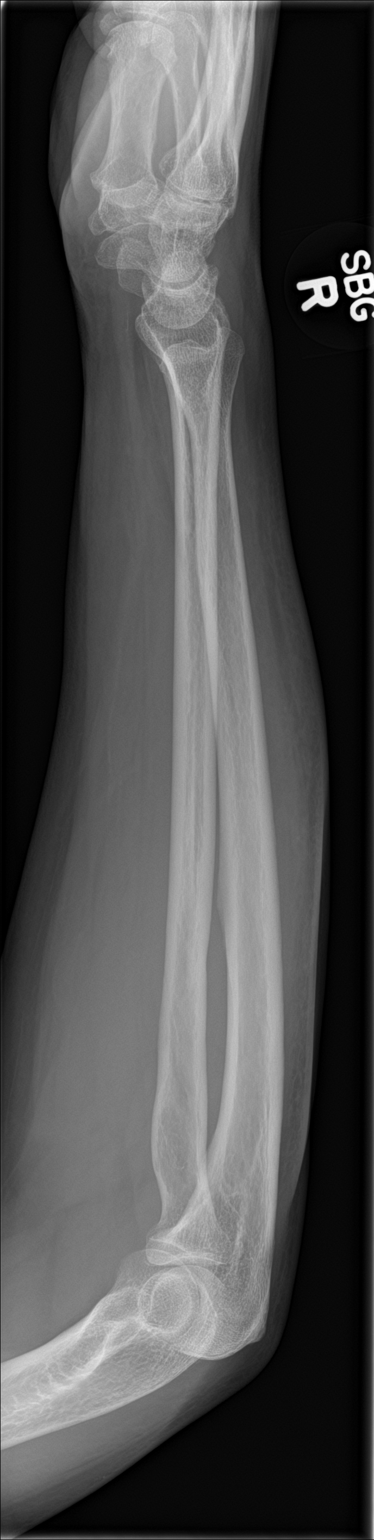

[2 of 2 positions shown; findings below may reference images not displayed]

FINDINGS: Dorsal mid forearm soft tissue swelling. No fracture or opaque
foreign body. No malalignment.
IMPRESSION: Dorsal forearm swelling without acute osseous finding or opaque
foreign body.

## 2016-09-08 ENCOUNTER — Emergency Department (HOSPITAL_COMMUNITY): Payer: Medicare Other

## 2016-09-08 ENCOUNTER — Encounter (HOSPITAL_COMMUNITY): Payer: Self-pay | Admitting: Emergency Medicine

## 2016-09-08 ENCOUNTER — Inpatient Hospital Stay (HOSPITAL_COMMUNITY)
Admission: EM | Admit: 2016-09-08 | Discharge: 2016-09-15 | DRG: 291 | Disposition: A | Discharge status: Home / Self Care | Payer: Medicare Other | Attending: Family Medicine | Admitting: Family Medicine

## 2016-09-08 DIAGNOSIS — J189 Pneumonia, unspecified organism: Secondary | ICD-10-CM

## 2016-09-08 DIAGNOSIS — K219 Gastro-esophageal reflux disease without esophagitis: Secondary | ICD-10-CM | POA: Diagnosis present

## 2016-09-08 DIAGNOSIS — F17210 Nicotine dependence, cigarettes, uncomplicated: Secondary | ICD-10-CM | POA: Diagnosis present

## 2016-09-08 DIAGNOSIS — R748 Abnormal levels of other serum enzymes: Secondary | ICD-10-CM | POA: Diagnosis present

## 2016-09-08 DIAGNOSIS — E860 Dehydration: Secondary | ICD-10-CM | POA: Diagnosis present

## 2016-09-08 DIAGNOSIS — E872 Acidosis, unspecified: Secondary | ICD-10-CM

## 2016-09-08 DIAGNOSIS — Z8701 Personal history of pneumonia (recurrent): Secondary | ICD-10-CM

## 2016-09-08 DIAGNOSIS — J9621 Acute and chronic respiratory failure with hypoxia: Secondary | ICD-10-CM | POA: Diagnosis present

## 2016-09-08 DIAGNOSIS — G4733 Obstructive sleep apnea (adult) (pediatric): Secondary | ICD-10-CM | POA: Diagnosis present

## 2016-09-08 DIAGNOSIS — I132 Hypertensive heart and chronic kidney disease with heart failure and with stage 5 chronic kidney disease, or end stage renal disease: Principal | ICD-10-CM | POA: Diagnosis present

## 2016-09-08 DIAGNOSIS — E1159 Type 2 diabetes mellitus with other circulatory complications: Secondary | ICD-10-CM | POA: Diagnosis present

## 2016-09-08 DIAGNOSIS — I5033 Acute on chronic diastolic (congestive) heart failure: Secondary | ICD-10-CM | POA: Diagnosis present

## 2016-09-08 DIAGNOSIS — F419 Anxiety disorder, unspecified: Secondary | ICD-10-CM | POA: Diagnosis present

## 2016-09-08 DIAGNOSIS — Z90710 Acquired absence of both cervix and uterus: Secondary | ICD-10-CM

## 2016-09-08 DIAGNOSIS — I2510 Atherosclerotic heart disease of native coronary artery without angina pectoris: Secondary | ICD-10-CM | POA: Diagnosis present

## 2016-09-08 DIAGNOSIS — R0602 Shortness of breath: Secondary | ICD-10-CM | POA: Diagnosis not present

## 2016-09-08 DIAGNOSIS — R109 Unspecified abdominal pain: Secondary | ICD-10-CM | POA: Diagnosis not present

## 2016-09-08 DIAGNOSIS — N184 Chronic kidney disease, stage 4 (severe): Secondary | ICD-10-CM | POA: Diagnosis present

## 2016-09-08 DIAGNOSIS — F329 Major depressive disorder, single episode, unspecified: Secondary | ICD-10-CM | POA: Diagnosis present

## 2016-09-08 DIAGNOSIS — N289 Disorder of kidney and ureter, unspecified: Secondary | ICD-10-CM

## 2016-09-08 DIAGNOSIS — Z955 Presence of coronary angioplasty implant and graft: Secondary | ICD-10-CM

## 2016-09-08 DIAGNOSIS — Z794 Long term (current) use of insulin: Secondary | ICD-10-CM

## 2016-09-08 DIAGNOSIS — Z86718 Personal history of other venous thrombosis and embolism: Secondary | ICD-10-CM

## 2016-09-08 DIAGNOSIS — F32A Depression, unspecified: Secondary | ICD-10-CM | POA: Diagnosis present

## 2016-09-08 DIAGNOSIS — N179 Acute kidney failure, unspecified: Secondary | ICD-10-CM | POA: Diagnosis not present

## 2016-09-08 DIAGNOSIS — N186 End stage renal disease: Secondary | ICD-10-CM | POA: Diagnosis not present

## 2016-09-08 DIAGNOSIS — D631 Anemia in chronic kidney disease: Secondary | ICD-10-CM | POA: Diagnosis present

## 2016-09-08 DIAGNOSIS — I509 Heart failure, unspecified: Secondary | ICD-10-CM | POA: Diagnosis present

## 2016-09-08 DIAGNOSIS — Y95 Nosocomial condition: Secondary | ICD-10-CM | POA: Diagnosis present

## 2016-09-08 DIAGNOSIS — I10 Essential (primary) hypertension: Secondary | ICD-10-CM | POA: Diagnosis present

## 2016-09-08 DIAGNOSIS — N189 Chronic kidney disease, unspecified: Secondary | ICD-10-CM

## 2016-09-08 DIAGNOSIS — Z7982 Long term (current) use of aspirin: Secondary | ICD-10-CM

## 2016-09-08 DIAGNOSIS — E1122 Type 2 diabetes mellitus with diabetic chronic kidney disease: Secondary | ICD-10-CM | POA: Diagnosis present

## 2016-09-08 DIAGNOSIS — I5032 Chronic diastolic (congestive) heart failure: Secondary | ICD-10-CM | POA: Diagnosis present

## 2016-09-08 DIAGNOSIS — E1169 Type 2 diabetes mellitus with other specified complication: Secondary | ICD-10-CM | POA: Diagnosis present

## 2016-09-08 DIAGNOSIS — R778 Other specified abnormalities of plasma proteins: Secondary | ICD-10-CM | POA: Diagnosis present

## 2016-09-08 DIAGNOSIS — E785 Hyperlipidemia, unspecified: Secondary | ICD-10-CM | POA: Diagnosis present

## 2016-09-08 DIAGNOSIS — E876 Hypokalemia: Secondary | ICD-10-CM | POA: Diagnosis present

## 2016-09-08 DIAGNOSIS — F319 Bipolar disorder, unspecified: Secondary | ICD-10-CM | POA: Diagnosis present

## 2016-09-08 DIAGNOSIS — E119 Type 2 diabetes mellitus without complications: Secondary | ICD-10-CM | POA: Diagnosis present

## 2016-09-08 DIAGNOSIS — R7989 Other specified abnormal findings of blood chemistry: Secondary | ICD-10-CM | POA: Diagnosis present

## 2016-09-08 DIAGNOSIS — M7989 Other specified soft tissue disorders: Secondary | ICD-10-CM | POA: Diagnosis present

## 2016-09-08 LAB — COMPREHENSIVE METABOLIC PANEL
ALT: 17 U/L (ref 14–54)
ANION GAP: 13 (ref 5–15)
AST: 17 U/L (ref 15–41)
Albumin: 3.6 g/dL (ref 3.5–5.0)
Alkaline Phosphatase: 78 U/L (ref 38–126)
BUN: 48 mg/dL — ABNORMAL HIGH (ref 6–20)
CHLORIDE: 102 mmol/L (ref 101–111)
CO2: 16 mmol/L — ABNORMAL LOW (ref 22–32)
Calcium: 8.8 mg/dL — ABNORMAL LOW (ref 8.9–10.3)
Creatinine, Ser: 4.48 mg/dL — ABNORMAL HIGH (ref 0.44–1.00)
GFR, EST AFRICAN AMERICAN: 11 mL/min — AB (ref >60)
GFR, EST NON AFRICAN AMERICAN: 10 mL/min — AB (ref >60)
Glucose, Bld: 121 mg/dL — ABNORMAL HIGH (ref 65–99)
POTASSIUM: 3.4 mmol/L — AB (ref 3.5–5.1)
Sodium: 131 mmol/L — ABNORMAL LOW (ref 135–145)
Total Bilirubin: 0.9 mg/dL (ref 0.3–1.2)
Total Protein: 7.1 g/dL (ref 6.5–8.1)

## 2016-09-08 LAB — BRAIN NATRIURETIC PEPTIDE: B NATRIURETIC PEPTIDE 5: 1379.8 pg/mL — AB (ref 0.0–100.0)

## 2016-09-08 LAB — TROPONIN I: Troponin I: 0.03 ng/mL (ref <0.03)

## 2016-09-08 LAB — CBC WITH DIFFERENTIAL/PLATELET
BASOS ABS: 0 10*3/uL (ref 0.0–0.1)
Basophils Relative: 1 %
Eosinophils Absolute: 0.2 10*3/uL (ref 0.0–0.7)
Eosinophils Relative: 2 %
HCT: 25.6 % — ABNORMAL LOW (ref 36.0–46.0)
Hemoglobin: 8.6 g/dL — ABNORMAL LOW (ref 12.0–15.0)
LYMPHS PCT: 19 %
Lymphs Abs: 1.4 10*3/uL (ref 0.7–4.0)
MCH: 30.9 pg (ref 26.0–34.0)
MCHC: 33.6 g/dL (ref 30.0–36.0)
MCV: 92.1 fL (ref 78.0–100.0)
MONO ABS: 0.6 10*3/uL (ref 0.1–1.0)
Monocytes Relative: 8 %
Neutro Abs: 5.3 10*3/uL (ref 1.7–7.7)
Neutrophils Relative %: 70 %
Platelets: 248 10*3/uL (ref 150–400)
RBC: 2.78 MIL/uL — ABNORMAL LOW (ref 3.87–5.11)
RDW: 17.2 % — AB (ref 11.5–15.5)
WBC: 7.5 10*3/uL (ref 4.0–10.5)

## 2016-09-08 LAB — LIPASE, BLOOD: LIPASE: 20 U/L (ref 11–51)

## 2016-09-08 MED ORDER — DEXTROSE 5 % IV SOLN
1.0000 g | Freq: Once | INTRAVENOUS | Status: AC
  Administered 2016-09-08: 1 g via INTRAVENOUS
  Filled 2016-09-08: qty 1

## 2016-09-08 MED ORDER — VANCOMYCIN HCL IN DEXTROSE 1-5 GM/200ML-% IV SOLN
1000.0000 mg | Freq: Once | INTRAVENOUS | Status: AC
  Administered 2016-09-09: 1000 mg via INTRAVENOUS
  Filled 2016-09-08: qty 200

## 2016-09-08 MED ORDER — ALBUTEROL SULFATE (2.5 MG/3ML) 0.083% IN NEBU
5.0000 mg | INHALATION_SOLUTION | Freq: Once | RESPIRATORY_TRACT | Status: AC
  Administered 2016-09-08: 5 mg via RESPIRATORY_TRACT
  Filled 2016-09-08: qty 6

## 2016-09-08 NOTE — ED Notes (Signed)
Patient transported to X-ray 

## 2016-09-08 NOTE — ED Notes (Signed)
Unable to collect labs patient going to xray

## 2016-09-08 NOTE — ED Triage Notes (Signed)
Pt presents with family for evaluation of shortness of breath along with abd pain that has been going on since 09/05/16. Pt was seen on 08/31/16 for similar symptoms at [name].

## 2016-09-08 NOTE — ED Triage Notes (Signed)
Pt family reports that pt was recently discharged from skilled nursing on Saturday and was dx with pneumonia at facility and was started on doxycycline but no prescription was given to family. Pt 85% on room air.

## 2016-09-08 NOTE — ED Provider Notes (Signed)
[facility] DEPT Provider Note   CSN: [id number] Arrival date & time: 09/08/16  2041     History   Chief Complaint Chief Complaint  Patient presents with  . Abdominal Pain  . Shortness of Breath    HPI Robin [name] is a 66 y.o. female.  HPI Pt complains of shortness of breath that started last week.  She thought the sx would go away but the breathing got worse.  It is worse when she tries to lie flat.   She has noticed some increased leg swelling.  No fevers.  Some cough.  Her chest feels tight when she tries to breathe.  Pt had been at a skilled nursing facility for 20 days recovering from a surgery.  She was told she had PNA the end of last week by CXR.  She was started on doxycycline but it has not helped.  She is not normally on oxygen.   Past Medical History:  Diagnosis Date  . Anemia   . Anxiety   . Arthritis   . Benign hypertension   . Bipolar disorder ([place])   . CHF (congestive heart failure) ([place])   . Chronic kidney disease, stage V [facility])    Nephrologist is with [facility] (Dr. Tammi Klippel)  . Coronary artery disease   . Depression   . Diabetes mellitus without complication ([place])   . DVT (deep venous thrombosis) (HCC)    right lower leg  . GERD (gastroesophageal reflux disease)   . Heart murmur   . History of blood transfusion   . History of bronchitis   . History of pneumonia   . Shortness of breath dyspnea   . Sleep apnea   . Type 2 diabetes mellitus [facility])     Patient Active Problem List   Diagnosis Date Noted  . SIRS (systemic inflammatory response syndrome) ([place]) 08/08/2016  . CHF (congestive heart failure) ([place]) 08/07/2016  . Right leg DVT ([place]) 12/05/2015  . Charcot foot due to diabetes mellitus ([place])   . Hyperlipidemia 03/26/2015  . Charcot ankle 03/16/2015  . Confusion 01/21/2015  . Anemia 01/21/2015  . Multiple falls 01/21/2015  . Anemia, chronic renal failure   . Ventricular tachycardia ([place])   . Chronic renal disease, stage 4, severely  decreased glomerular filtration rate between 15-29 mL/min/1.73 square meter (HCC)   . Anemia in chronic kidney disease 12/09/2014  . CKD (chronic kidney disease) 12/09/2014  . Hypertensive heart/renal disease with failure ([place]) 12/09/2014  . Bipolar affective disorder ([place]) 12/09/2014  . Acute encephalopathy   . Acute delirium 11/18/2014  . Type 2 diabetes mellitus with hyperglycemia ([place]) 11/18/2014  . Essential hypertension 11/18/2014  . Chronic combined systolic and diastolic CHF (congestive heart failure) ([place]) 11/18/2014  . Fracture dislocation of ankle 11/17/2014  . DM type 2, uncontrolled, with renal complications ([place]) 65/68/1275  . Hypokalemia 11/17/2014  . Obesity 11/17/2014  . CAD (coronary artery disease), native coronary artery with 2 stents  11/17/2014    Past Surgical History:  Procedure Laterality Date  . ABDOMINAL HYSTERECTOMY    . ANKLE CLOSED REDUCTION N/A 11/17/2014   Procedure: CLOSED REDUCTION ANKLE;  Surgeon: Earlie Server, MD;  Location: [facility];  Service: Orthopedics;  Laterality: N/A;  . ANKLE FUSION Left 03/16/2015   Procedure: Left Tibiocalcaneal Fusion;  Surgeon: Newt Minion, MD;  Location: [facility];  Service: Orthopedics;  Laterality: Left;  . APPLICATION OF WOUND VAC Right 08/06/2016   Procedure: APPLICATION OF PREVENA WOUND VAC;  Surgeon: Newt Minion,  MD;  Location: [facility];  Service: Orthopedics;  Laterality: Right;  . AV FISTULA PLACEMENT Left [id number]   done at [facility]     2 stent   . CHOLECYSTECTOMY    . CORONARY STENT PLACEMENT    . FOOT ARTHRODESIS Right 08/06/2016   Procedure: Right Foot Fusion Lisfranc Joint;  Surgeon: Newt Minion, MD;  Location: [facility];  Service: Orthopedics;  Laterality: Right;  . HARDWARE REMOVAL Left 03/16/2015   Procedure: Removal Hardware Left Ankle;  Surgeon: Newt Minion, MD;  Location: [facility];  Service: Orthopedics;  Laterality: Left;  . ORIF ANKLE FRACTURE Left 11/20/2014   Procedure: OPEN  REDUCTION INTERNAL FIXATION (ORIF) ANKLE FRACTURE;  Surgeon: Renette Butters, MD;  Location: [facility];  Service: Orthopedics;  Laterality: Left;  . TONSILLECTOMY      OB History    No data available       Home Medications    Prior to Admission medications   Medication Sig Start Date End Date Taking? Authorizing Provider  acetaminophen (TYLENOL) 500 MG tablet Take 500 mg by mouth every 6 (six) hours as needed for mild pain, moderate pain, fever or headache.   Yes Historical Provider, MD  amLODipine (NORVASC) 10 MG tablet Take 10 mg by mouth daily.    Yes Historical Provider, MD  ammonium lactate (LAC-HYDRIN) 12 % lotion Apply 1 application topically 2 (two) times daily as needed (for itching/irritation).    Yes Historical Provider, MD  ARIPiprazole (ABILIFY) 15 MG tablet Take 7.5 mg by mouth daily.    Yes Historical Provider, MD  ARIPiprazole (ABILIFY) 5 MG tablet Take 2.5 mg by mouth at bedtime as needed (for agitation).    Yes Historical Provider, MD  aspirin EC 81 MG tablet Take 81 mg by mouth daily.   Yes Historical Provider, MD  atorvastatin (LIPITOR) 40 MG tablet Take 40 mg by mouth at bedtime.    Yes Historical Provider, MD  bumetanide (BUMEX) 2 MG tablet Take 4-6 mg by mouth 2 (two) times daily. Pt takes three tablets in the morning and two at bedtime.   Yes Historical Provider, MD  carvedilol (COREG) 25 MG tablet Take 25 mg by mouth 2 (two) times daily with a meal.   Yes Historical Provider, MD  dextrose (GLUTOSE) 40 % GEL Take 1 Tube by mouth once as needed (for blood sugar less than 60).    Yes Historical Provider, MD  docusate sodium (COLACE) 100 MG capsule Take 100 mg by mouth 2 (two) times daily.   Yes Historical Provider, MD  ferrous sulfate 325 (65 FE) MG tablet Take 325 mg by mouth 2 (two) times daily with a meal.   Yes Historical Provider, MD  gabapentin (NEURONTIN) 100 MG capsule Take 100 mg by mouth at bedtime.   Yes Historical Provider, MD  hydrALAZINE (APRESOLINE) 50 MG  tablet Take 50 mg by mouth 2 (two) times daily.    Yes Historical Provider, MD  insulin detemir (LEVEMIR) 100 UNIT/ML injection Inject 12 Units into the skin at bedtime.    Yes Historical Provider, MD  insulin lispro (HUMALOG) 100 UNIT/ML injection Inject 2-10 Units into the skin 3 (three) times daily before meals. Pt uses as needed per sliding scale:    150-200:  2 units 201-250:  4 units 251-300:  6 units 301-350:  8 units 351-400:  10 units and call MD   Yes Historical Provider, MD  isosorbide mononitrate (IMDUR) 60 MG 24 hr  tablet Take 60 mg by mouth daily.    Yes Historical Provider, MD  lidocaine (LMX) 4 % cream Apply 1 application topically 3 (three) times daily.   Yes Historical Provider, MD  Melatonin 3 MG TABS Take 6 mg by mouth at bedtime as needed (for sleep).   Yes Historical Provider, MD  nicotine (NICODERM CQ - DOSED IN MG/24 HOURS) 21 mg/24hr patch Place 21 mg onto the skin daily.    Yes Historical Provider, MD  nitroGLYCERIN (NITROSTAT) 0.4 MG SL tablet Place 0.4 mg under the tongue every 5 (five) minutes as needed for chest pain.   Yes Historical Provider, MD  oxyCODONE-acetaminophen (PERCOCET/ROXICET) 5-325 MG tablet Take 0.5-1 tablets by mouth every 4 (four) hours as needed for severe pain.    Yes Historical Provider, MD  pantoprazole (PROTONIX) 20 MG tablet Take 20 mg by mouth 2 (two) times daily.    Yes Historical Provider, MD  polyethylene glycol (MIRALAX / GLYCOLAX) packet Take 17 g by mouth daily as needed for mild constipation.    Yes Historical Provider, MD  sennosides-docusate sodium (SENOKOT-S) 8.6-50 MG tablet Take 2 tablets by mouth 2 (two) times daily as needed for constipation.    Yes Historical Provider, MD  Vitamin D, Ergocalciferol, (DRISDOL) 50000 units CAPS capsule Take 50,000 Units by mouth every Monday.   Yes Historical Provider, MD    Family History Family History  Problem Relation Age of Onset  . Family history unknown: Yes    Social  History Social History  Substance Use Topics  . Smoking status: Current Every Day Smoker    Packs/day: 1.00    Types: Cigarettes  . Smokeless tobacco: Never Used  . Alcohol use No     Allergies   Patient has no known allergies.   Review of Systems Review of Systems  All other systems reviewed and are negative.    Physical Exam Updated Vital Signs BP (!) 145/66   Pulse 70   Temp 98.3 F (36.8 C) (Oral)   Resp (!) 23   Ht 5\' 2"  (1.575 m)   Wt 74.8 kg   SpO2 94%   BMI 30.18 kg/m   Physical Exam  Constitutional: No distress.  HENT:  Head: Normocephalic and atraumatic.  Right Ear: External ear normal.  Left Ear: External ear normal.  Eyes: Conjunctivae are normal. Right eye exhibits no discharge. Left eye exhibits no discharge. No scleral icterus.  Neck: Neck supple. No tracheal deviation present.  Cardiovascular: Normal rate, regular rhythm and intact distal pulses.   Pulmonary/Chest: Effort normal and breath sounds normal. No stridor. No respiratory distress. She has no wheezes. She has no rales.  Abdominal: Soft. Bowel sounds are normal. She exhibits no distension. There is no tenderness. There is no rebound and no guarding.  No hernia appreciated  Musculoskeletal: She exhibits no edema or tenderness.  Neurological: She is alert. She has normal strength. No cranial nerve deficit (no facial droop, extraocular movements intact, no slurred speech) or sensory deficit. She exhibits normal muscle tone. She displays no seizure activity. Coordination normal.  Skin: Skin is warm and dry. No rash noted. She is not diaphoretic.  Psychiatric: She has a normal mood and affect.  Nursing note and vitals reviewed.    ED Treatments / Results  Labs (all labs ordered are listed, but only abnormal results are displayed) Labs Reviewed  CBC WITH DIFFERENTIAL/PLATELET - Abnormal; Notable for the following:       Result Value   RBC 2.78 (*)  Hemoglobin 8.6 (*)    HCT 25.6 (*)     RDW 17.2 (*)    All other components within normal limits  COMPREHENSIVE METABOLIC PANEL - Abnormal; Notable for the following:    Sodium 131 (*)    Potassium 3.4 (*)    CO2 16 (*)    Glucose, Bld 121 (*)    BUN 48 (*)    Creatinine, Ser 4.48 (*)    Calcium 8.8 (*)    GFR calc non Af Amer 10 (*)    GFR calc Af Amer 11 (*)    All other components within normal limits  BRAIN NATRIURETIC PEPTIDE - Abnormal; Notable for the following:    B Natriuretic Peptide 1,379.8 (*)    All other components within normal limits  TROPONIN I - Abnormal; Notable for the following:    Troponin I 0.03 (*)    All other components within normal limits  LIPASE, BLOOD  URINALYSIS, ROUTINE W REFLEX MICROSCOPIC    EKG  EKG Interpretation  Date/Time:  Monday September 08 2016 22:05:59 EDT Ventricular Rate:  83 PR Interval:    QRS Duration: 81 QT Interval:  608 QTC Calculation: 628 R Axis:   32 Text Interpretation:  Sinus rhythm Paired ventricular premature complexes Probable anterior infarct, age indeterminate Prolonged QT interval No significant change since last tracing Confirmed by Macaila Tahir  MD-J, Sreenidhi Ganson [id number]) on 09/08/2016 11:13:19 PM       Radiology Dg Chest 2 View  Result Date: 09/08/2016 CLINICAL DATA:  Shortness of breath EXAM: CHEST  2 VIEW COMPARISON:  08/31/2016 FINDINGS: Cardiomegaly with central vascular congestion and mild interstitial edema. Small bilateral left greater than right pleural effusions. Unable to rule out infiltrate at the left base. No pneumothorax. IMPRESSION: 1. Cardiomegaly with central vascular congestion and mild interstitial edema 2. Small bilateral effusions left greater than right. Increased opacity at the left lung base, may reflect atelectasis or an infiltrate Electronically Signed   By: Donavan Foil M.D.   On: 09/08/2016 22:02   Dg Abd 2 Views  Result Date: 09/08/2016 CLINICAL DATA:  Mid abdominal pain with nausea EXAM: ABDOMEN - 2 VIEW COMPARISON:  None. FINDINGS:  Surgical clips in the upper abdomen. Nonobstructed bowel-gas pattern. No gross free air on decubitus view. IMPRESSION: Nonobstructed bowel-gas pattern Electronically Signed   By: Donavan Foil M.D.   On: 09/08/2016 23:14    Procedures Procedures (including critical care time)  Medications Ordered in ED Medications  vancomycin (VANCOCIN) IVPB 1000 mg/200 mL premix (1,000 mg Intravenous New Bag/Given 09/09/16 0011)  albuterol (PROVENTIL) (2.5 MG/3ML) 0.083% nebulizer solution 5 mg (5 mg Nebulization Given 09/08/16 2244)  ceFEPIme (MAXIPIME) 1 g in dextrose 5 % 50 mL IVPB (0 g Intravenous Stopped 09/09/16 0014)     Initial Impression / Assessment and Plan / ED Course  I have reviewed the triage vital signs and the nursing notes.  Pertinent labs & imaging results that were available during my care of the patient were reviewed by me and considered in my medical decision making (see chart for details).    Patient's laboratory tests suggest the possibility of pneumonia. The diagnosis is not entirely clear however because she does not have a fever and her white blood cell count is not elevated. The patient has been coughing however and certainly is at risk considering her medical history. I will start her on antibiotics.  Laboratory tests also showed worsening renal failure and metabolic acidosis. This could be a contributing factor to  her dyspnea.  I'll consult the medical service for admission and further evaluation.  Final Clinical Impressions(s) / ED Diagnoses   Final diagnoses:  Abdominal pain  Renal insufficiency  Metabolic acidosis  HCAP (healthcare-associated pneumonia)      Dorie Rank, MD 09/09/16 [phone]

## 2016-09-08 NOTE — ED Notes (Signed)
EKG given to EDP,Yao,MD., for review. 

## 2016-09-09 ENCOUNTER — Inpatient Hospital Stay (HOSPITAL_COMMUNITY): Payer: Medicare Other

## 2016-09-09 ENCOUNTER — Encounter (HOSPITAL_COMMUNITY): Payer: Self-pay | Admitting: Radiology

## 2016-09-09 DIAGNOSIS — N186 End stage renal disease: Secondary | ICD-10-CM | POA: Diagnosis present

## 2016-09-09 DIAGNOSIS — Z90710 Acquired absence of both cervix and uterus: Secondary | ICD-10-CM | POA: Diagnosis not present

## 2016-09-09 DIAGNOSIS — G4733 Obstructive sleep apnea (adult) (pediatric): Secondary | ICD-10-CM | POA: Diagnosis present

## 2016-09-09 DIAGNOSIS — Z794 Long term (current) use of insulin: Secondary | ICD-10-CM | POA: Diagnosis not present

## 2016-09-09 DIAGNOSIS — I132 Hypertensive heart and chronic kidney disease with heart failure and with stage 5 chronic kidney disease, or end stage renal disease: Secondary | ICD-10-CM | POA: Diagnosis present

## 2016-09-09 DIAGNOSIS — E876 Hypokalemia: Secondary | ICD-10-CM | POA: Diagnosis not present

## 2016-09-09 DIAGNOSIS — E1121 Type 2 diabetes mellitus with diabetic nephropathy: Secondary | ICD-10-CM | POA: Diagnosis not present

## 2016-09-09 DIAGNOSIS — E1129 Type 2 diabetes mellitus with other diabetic kidney complication: Secondary | ICD-10-CM | POA: Diagnosis not present

## 2016-09-09 DIAGNOSIS — F319 Bipolar disorder, unspecified: Secondary | ICD-10-CM | POA: Diagnosis present

## 2016-09-09 DIAGNOSIS — I120 Hypertensive chronic kidney disease with stage 5 chronic kidney disease or end stage renal disease: Secondary | ICD-10-CM | POA: Diagnosis not present

## 2016-09-09 DIAGNOSIS — E785 Hyperlipidemia, unspecified: Secondary | ICD-10-CM | POA: Diagnosis present

## 2016-09-09 DIAGNOSIS — K219 Gastro-esophageal reflux disease without esophagitis: Secondary | ICD-10-CM | POA: Diagnosis not present

## 2016-09-09 DIAGNOSIS — D631 Anemia in chronic kidney disease: Secondary | ICD-10-CM

## 2016-09-09 DIAGNOSIS — I2510 Atherosclerotic heart disease of native coronary artery without angina pectoris: Secondary | ICD-10-CM | POA: Diagnosis not present

## 2016-09-09 DIAGNOSIS — R1084 Generalized abdominal pain: Secondary | ICD-10-CM | POA: Diagnosis not present

## 2016-09-09 DIAGNOSIS — Y95 Nosocomial condition: Secondary | ICD-10-CM | POA: Diagnosis present

## 2016-09-09 DIAGNOSIS — F329 Major depressive disorder, single episode, unspecified: Secondary | ICD-10-CM | POA: Diagnosis present

## 2016-09-09 DIAGNOSIS — I10 Essential (primary) hypertension: Secondary | ICD-10-CM | POA: Diagnosis not present

## 2016-09-09 DIAGNOSIS — J189 Pneumonia, unspecified organism: Secondary | ICD-10-CM | POA: Diagnosis not present

## 2016-09-09 DIAGNOSIS — N183 Chronic kidney disease, stage 3 (moderate): Secondary | ICD-10-CM | POA: Diagnosis not present

## 2016-09-09 DIAGNOSIS — M7989 Other specified soft tissue disorders: Secondary | ICD-10-CM | POA: Diagnosis present

## 2016-09-09 DIAGNOSIS — N179 Acute kidney failure, unspecified: Secondary | ICD-10-CM | POA: Diagnosis not present

## 2016-09-09 DIAGNOSIS — R778 Other specified abnormalities of plasma proteins: Secondary | ICD-10-CM | POA: Diagnosis present

## 2016-09-09 DIAGNOSIS — E872 Acidosis: Secondary | ICD-10-CM | POA: Diagnosis not present

## 2016-09-09 DIAGNOSIS — E782 Mixed hyperlipidemia: Secondary | ICD-10-CM | POA: Diagnosis not present

## 2016-09-09 DIAGNOSIS — F419 Anxiety disorder, unspecified: Secondary | ICD-10-CM | POA: Diagnosis present

## 2016-09-09 DIAGNOSIS — I5032 Chronic diastolic (congestive) heart failure: Secondary | ICD-10-CM | POA: Diagnosis not present

## 2016-09-09 DIAGNOSIS — Z86718 Personal history of other venous thrombosis and embolism: Secondary | ICD-10-CM | POA: Diagnosis not present

## 2016-09-09 DIAGNOSIS — R1110 Vomiting, unspecified: Secondary | ICD-10-CM | POA: Diagnosis not present

## 2016-09-09 DIAGNOSIS — E860 Dehydration: Secondary | ICD-10-CM | POA: Diagnosis present

## 2016-09-09 DIAGNOSIS — E1165 Type 2 diabetes mellitus with hyperglycemia: Secondary | ICD-10-CM | POA: Diagnosis not present

## 2016-09-09 DIAGNOSIS — N185 Chronic kidney disease, stage 5: Secondary | ICD-10-CM | POA: Diagnosis not present

## 2016-09-09 DIAGNOSIS — N289 Disorder of kidney and ureter, unspecified: Secondary | ICD-10-CM | POA: Diagnosis not present

## 2016-09-09 DIAGNOSIS — I509 Heart failure, unspecified: Secondary | ICD-10-CM | POA: Diagnosis not present

## 2016-09-09 DIAGNOSIS — N184 Chronic kidney disease, stage 4 (severe): Secondary | ICD-10-CM | POA: Diagnosis not present

## 2016-09-09 DIAGNOSIS — R7989 Other specified abnormal findings of blood chemistry: Secondary | ICD-10-CM

## 2016-09-09 DIAGNOSIS — I5033 Acute on chronic diastolic (congestive) heart failure: Secondary | ICD-10-CM | POA: Diagnosis not present

## 2016-09-09 DIAGNOSIS — R1111 Vomiting without nausea: Secondary | ICD-10-CM | POA: Diagnosis not present

## 2016-09-09 DIAGNOSIS — R0602 Shortness of breath: Secondary | ICD-10-CM | POA: Diagnosis not present

## 2016-09-09 DIAGNOSIS — R748 Abnormal levels of other serum enzymes: Secondary | ICD-10-CM | POA: Diagnosis not present

## 2016-09-09 DIAGNOSIS — Z7982 Long term (current) use of aspirin: Secondary | ICD-10-CM | POA: Diagnosis not present

## 2016-09-09 DIAGNOSIS — R109 Unspecified abdominal pain: Secondary | ICD-10-CM | POA: Diagnosis present

## 2016-09-09 DIAGNOSIS — J9621 Acute and chronic respiratory failure with hypoxia: Secondary | ICD-10-CM | POA: Diagnosis not present

## 2016-09-09 DIAGNOSIS — F32A Depression, unspecified: Secondary | ICD-10-CM | POA: Diagnosis present

## 2016-09-09 DIAGNOSIS — F17210 Nicotine dependence, cigarettes, uncomplicated: Secondary | ICD-10-CM | POA: Diagnosis present

## 2016-09-09 DIAGNOSIS — E1122 Type 2 diabetes mellitus with diabetic chronic kidney disease: Secondary | ICD-10-CM | POA: Diagnosis present

## 2016-09-09 HISTORY — DX: Acute and chronic respiratory failure with hypoxia: J96.21

## 2016-09-09 LAB — PROTIME-INR
INR: 1.15
PROTHROMBIN TIME: 14.8 s (ref 11.4–15.2)

## 2016-09-09 LAB — GLUCOSE, CAPILLARY
GLUCOSE-CAPILLARY: 104 mg/dL — AB (ref 65–99)
GLUCOSE-CAPILLARY: 79 mg/dL (ref 65–99)
Glucose-Capillary: 111 mg/dL — ABNORMAL HIGH (ref 65–99)
Glucose-Capillary: 96 mg/dL (ref 65–99)
Glucose-Capillary: 138 mg/dL — ABNORMAL HIGH (ref 65–99)

## 2016-09-09 LAB — LIPID PANEL
Cholesterol: 77 mg/dL (ref 0–200)
HDL: 32 mg/dL — AB (ref >40)
LDL CALC: 25 mg/dL (ref 0–99)
Total CHOL/HDL Ratio: 2.4 RATIO
Triglycerides: 102 mg/dL (ref <150)
VLDL: 20 mg/dL (ref 0–40)

## 2016-09-09 LAB — BASIC METABOLIC PANEL
ANION GAP: 13 (ref 5–15)
BUN: 52 mg/dL — AB (ref 6–20)
CHLORIDE: 100 mmol/L — AB (ref 101–111)
CO2: 18 mmol/L — ABNORMAL LOW (ref 22–32)
Calcium: 8.9 mg/dL (ref 8.9–10.3)
Creatinine, Ser: 4.75 mg/dL — ABNORMAL HIGH (ref 0.44–1.00)
GFR calc non Af Amer: 9 mL/min — ABNORMAL LOW (ref >60)
GFR, EST AFRICAN AMERICAN: 10 mL/min — AB (ref >60)
Glucose, Bld: 136 mg/dL — ABNORMAL HIGH (ref 65–99)
POTASSIUM: 3.3 mmol/L — AB (ref 3.5–5.1)
SODIUM: 131 mmol/L — AB (ref 135–145)

## 2016-09-09 LAB — URINALYSIS, ROUTINE W REFLEX MICROSCOPIC
BILIRUBIN URINE: NEGATIVE
Glucose, UA: NEGATIVE mg/dL
Hgb urine dipstick: NEGATIVE
Ketones, ur: NEGATIVE mg/dL
LEUKOCYTES UA: NEGATIVE
NITRITE: NEGATIVE
PROTEIN: 100 mg/dL — AB
Specific Gravity, Urine: 1.01 (ref 1.005–1.030)
pH: 5 (ref 5.0–8.0)

## 2016-09-09 LAB — D-DIMER, QUANTITATIVE (NOT AT ARMC): D DIMER QUANT: 1.23 ug{FEU}/mL — AB (ref 0.00–0.50)

## 2016-09-09 LAB — CBC
HEMATOCRIT: 25.8 % — AB (ref 36.0–46.0)
HEMOGLOBIN: 8.6 g/dL — AB (ref 12.0–15.0)
MCH: 30.5 pg (ref 26.0–34.0)
MCHC: 33.3 g/dL (ref 30.0–36.0)
MCV: 91.5 fL (ref 78.0–100.0)
Platelets: 248 10*3/uL (ref 150–400)
RBC: 2.82 MIL/uL — AB (ref 3.87–5.11)
RDW: 17.2 % — ABNORMAL HIGH (ref 11.5–15.5)
WBC: 7.5 10*3/uL (ref 4.0–10.5)

## 2016-09-09 LAB — TROPONIN I
TROPONIN I: 0.04 ng/mL — AB (ref <0.03)
TROPONIN I: 0.04 ng/mL — AB (ref <0.03)
Troponin I: 0.04 ng/mL (ref <0.03)

## 2016-09-09 LAB — APTT: aPTT: 36 seconds (ref 24–36)

## 2016-09-09 LAB — CBG MONITORING, ED: Glucose-Capillary: 139 mg/dL — ABNORMAL HIGH (ref 65–99)

## 2016-09-09 LAB — HEPARIN LEVEL (UNFRACTIONATED): HEPARIN UNFRACTIONATED: 0.18 [IU]/mL — AB (ref 0.30–0.70)

## 2016-09-09 MED ORDER — OXYCODONE-ACETAMINOPHEN 5-325 MG PO TABS
0.5000 | ORAL_TABLET | ORAL | Status: DC | PRN
  Administered 2016-09-10: 1 via ORAL
  Filled 2016-09-09: qty 1

## 2016-09-09 MED ORDER — NICOTINE 21 MG/24HR TD PT24
21.0000 mg | MEDICATED_PATCH | Freq: Every day | TRANSDERMAL | Status: DC
  Administered 2016-09-10 – 2016-09-14 (×3): 21 mg via TRANSDERMAL
  Filled 2016-09-09 (×6): qty 1

## 2016-09-09 MED ORDER — HYDRALAZINE HCL 20 MG/ML IJ SOLN
5.0000 mg | INTRAMUSCULAR | Status: DC | PRN
Start: 2016-09-09 — End: 2016-09-11

## 2016-09-09 MED ORDER — HYDRALAZINE HCL 50 MG PO TABS
50.0000 mg | ORAL_TABLET | Freq: Two times a day (BID) | ORAL | Status: DC
  Administered 2016-09-09 – 2016-09-11 (×5): 50 mg via ORAL
  Filled 2016-09-09 (×7): qty 1

## 2016-09-09 MED ORDER — TECHNETIUM TO 99M ALBUMIN AGGREGATED
4.2000 | Freq: Once | INTRAVENOUS | Status: AC | PRN
  Administered 2016-09-09: 4.2 via INTRAVENOUS

## 2016-09-09 MED ORDER — HEPARIN BOLUS VIA INFUSION
2000.0000 [IU] | Freq: Once | INTRAVENOUS | Status: AC
  Administered 2016-09-09: 2000 [IU] via INTRAVENOUS
  Filled 2016-09-09: qty 2000

## 2016-09-09 MED ORDER — ALBUTEROL SULFATE (2.5 MG/3ML) 0.083% IN NEBU
5.0000 mg | INHALATION_SOLUTION | RESPIRATORY_TRACT | Status: DC | PRN
  Administered 2016-09-09 (×3): 5 mg via RESPIRATORY_TRACT
  Filled 2016-09-09 (×3): qty 6

## 2016-09-09 MED ORDER — HEPARIN BOLUS VIA INFUSION
3000.0000 [IU] | Freq: Once | INTRAVENOUS | Status: AC
  Administered 2016-09-09: 3000 [IU] via INTRAVENOUS
  Filled 2016-09-09: qty 3000

## 2016-09-09 MED ORDER — CHLORHEXIDINE GLUCONATE 0.12 % MT SOLN
15.0000 mL | Freq: Two times a day (BID) | OROMUCOSAL | Status: DC
  Administered 2016-09-09 – 2016-09-15 (×8): 15 mL via OROMUCOSAL
  Filled 2016-09-09 (×11): qty 15

## 2016-09-09 MED ORDER — HEPARIN (PORCINE) IN NACL 100-0.45 UNIT/ML-% IJ SOLN
900.0000 [IU]/h | INTRAMUSCULAR | Status: DC
  Administered 2016-09-09: 900 [IU]/h via INTRAVENOUS
  Filled 2016-09-09: qty 250

## 2016-09-09 MED ORDER — HYDROXYZINE HCL 10 MG PO TABS
10.0000 mg | ORAL_TABLET | Freq: Three times a day (TID) | ORAL | Status: DC | PRN
  Filled 2016-09-09: qty 1

## 2016-09-09 MED ORDER — HEPARIN BOLUS VIA INFUSION
3000.0000 [IU] | Freq: Once | INTRAVENOUS | Status: AC
Start: 2016-09-09 — End: 2016-09-09
  Administered 2016-09-09: 3000 [IU] via INTRAVENOUS
  Filled 2016-09-09: qty 3000

## 2016-09-09 MED ORDER — INSULIN ASPART 100 UNIT/ML ~~LOC~~ SOLN
0.0000 [IU] | Freq: Three times a day (TID) | SUBCUTANEOUS | Status: DC
Start: 2016-09-09 — End: 2016-09-15
  Administered 2016-09-11: 2 [IU] via SUBCUTANEOUS
  Administered 2016-09-12 – 2016-09-13 (×2): 1 [IU] via SUBCUTANEOUS
  Administered 2016-09-13: 3 [IU] via SUBCUTANEOUS
  Administered 2016-09-14: 2 [IU] via SUBCUTANEOUS
  Administered 2016-09-14: 1 [IU] via SUBCUTANEOUS

## 2016-09-09 MED ORDER — ASPIRIN EC 81 MG PO TBEC
81.0000 mg | DELAYED_RELEASE_TABLET | Freq: Every day | ORAL | Status: DC
  Administered 2016-09-09 – 2016-09-15 (×7): 81 mg via ORAL
  Filled 2016-09-09 (×8): qty 1

## 2016-09-09 MED ORDER — ORAL CARE MOUTH RINSE
15.0000 mL | Freq: Two times a day (BID) | OROMUCOSAL | Status: DC
  Administered 2016-09-09 – 2016-09-14 (×8): 15 mL via OROMUCOSAL

## 2016-09-09 MED ORDER — AMLODIPINE BESYLATE 10 MG PO TABS
10.0000 mg | ORAL_TABLET | Freq: Every day | ORAL | Status: DC
  Administered 2016-09-09 – 2016-09-11 (×3): 10 mg via ORAL
  Filled 2016-09-09 (×3): qty 1

## 2016-09-09 MED ORDER — PANTOPRAZOLE SODIUM 20 MG PO TBEC
20.0000 mg | DELAYED_RELEASE_TABLET | Freq: Two times a day (BID) | ORAL | Status: DC
  Administered 2016-09-09 – 2016-09-15 (×13): 20 mg via ORAL
  Filled 2016-09-09 (×15): qty 1

## 2016-09-09 MED ORDER — NITROGLYCERIN 0.4 MG SL SUBL: 0.4000 mg | SUBLINGUAL_TABLET | SUBLINGUAL | Status: DC | PRN

## 2016-09-09 MED ORDER — FERROUS SULFATE 325 (65 FE) MG PO TABS
325.0000 mg | ORAL_TABLET | Freq: Two times a day (BID) | ORAL | Status: DC
  Administered 2016-09-09 – 2016-09-14 (×11): 325 mg via ORAL
  Filled 2016-09-09 (×12): qty 1

## 2016-09-09 MED ORDER — SENNOSIDES-DOCUSATE SODIUM 8.6-50 MG PO TABS: 2.0000 | ORAL_TABLET | Freq: Two times a day (BID) | ORAL | Status: DC | PRN

## 2016-09-09 MED ORDER — ONDANSETRON HCL 4 MG/2ML IJ SOLN
4.0000 mg | Freq: Four times a day (QID) | INTRAMUSCULAR | Status: DC | PRN
  Administered 2016-09-09 – 2016-09-12 (×3): 4 mg via INTRAVENOUS
  Filled 2016-09-09 (×3): qty 2

## 2016-09-09 MED ORDER — FUROSEMIDE 10 MG/ML IJ SOLN
80.0000 mg | Freq: Two times a day (BID) | INTRAMUSCULAR | Status: DC
  Administered 2016-09-09 – 2016-09-14 (×11): 80 mg via INTRAVENOUS
  Filled 2016-09-09 (×11): qty 8

## 2016-09-09 MED ORDER — CARVEDILOL 25 MG PO TABS
25.0000 mg | ORAL_TABLET | Freq: Two times a day (BID) | ORAL | Status: DC
  Administered 2016-09-09 – 2016-09-11 (×5): 25 mg via ORAL
  Filled 2016-09-09 (×6): qty 1

## 2016-09-09 MED ORDER — INSULIN DETEMIR 100 UNIT/ML ~~LOC~~ SOLN
8.0000 [IU] | Freq: Every day | SUBCUTANEOUS | Status: DC
  Administered 2016-09-09 – 2016-09-14 (×7): 8 [IU] via SUBCUTANEOUS
  Filled 2016-09-09 (×9): qty 0.08

## 2016-09-09 MED ORDER — IOPAMIDOL (ISOVUE-300) INJECTION 61%
30.0000 mL | Freq: Once | INTRAVENOUS | Status: AC | PRN
  Administered 2016-09-09: 30 mL via ORAL

## 2016-09-09 MED ORDER — SODIUM CHLORIDE 0.9% FLUSH
3.0000 mL | Freq: Two times a day (BID) | INTRAVENOUS | Status: DC
  Administered 2016-09-09 – 2016-09-15 (×10): 3 mL via INTRAVENOUS

## 2016-09-09 MED ORDER — POLYETHYLENE GLYCOL 3350 17 G PO PACK: 17.0000 g | PACK | Freq: Every day | ORAL | Status: DC | PRN

## 2016-09-09 MED ORDER — MELATONIN 3 MG PO TABS: 6.0000 mg | ORAL_TABLET | Freq: Every evening | ORAL | Status: DC | PRN

## 2016-09-09 MED ORDER — BUMETANIDE 2 MG PO TABS: 4.0000 mg | ORAL_TABLET | Freq: Every day | ORAL | Status: DC

## 2016-09-09 MED ORDER — ZOLPIDEM TARTRATE 5 MG PO TABS: 5.0000 mg | ORAL_TABLET | Freq: Every evening | ORAL | Status: DC | PRN

## 2016-09-09 MED ORDER — IOPAMIDOL (ISOVUE-300) INJECTION 61%
INTRAVENOUS | Status: AC
  Administered 2016-09-09: 30 mL via ORAL
  Filled 2016-09-09: qty 30

## 2016-09-09 MED ORDER — MORPHINE SULFATE (PF) 4 MG/ML IV SOLN
2.0000 mg | INTRAVENOUS | Status: DC | PRN
  Filled 2016-09-09: qty 1

## 2016-09-09 MED ORDER — LORATADINE 10 MG PO TABS
10.0000 mg | ORAL_TABLET | Freq: Every day | ORAL | Status: DC
  Administered 2016-09-09 – 2016-09-15 (×7): 10 mg via ORAL
  Filled 2016-09-09 (×7): qty 1

## 2016-09-09 MED ORDER — HEPARIN (PORCINE) IN NACL 100-0.45 UNIT/ML-% IJ SOLN
1300.0000 [IU]/h | INTRAMUSCULAR | Status: DC
  Filled 2016-09-09: qty 250

## 2016-09-09 MED ORDER — TECHNETIUM TC 99M DIETHYLENETRIAME-PENTAACETIC ACID: 32.0000 | Freq: Once | INTRAVENOUS | Status: DC

## 2016-09-09 MED ORDER — AMMONIUM LACTATE 12 % EX LOTN
1.0000 "application " | TOPICAL_LOTION | Freq: Two times a day (BID) | CUTANEOUS | Status: DC | PRN
  Filled 2016-09-09: qty 400

## 2016-09-09 MED ORDER — BUMETANIDE 2 MG PO TABS
6.0000 mg | ORAL_TABLET | Freq: Every day | ORAL | Status: DC
  Filled 2016-09-09: qty 3

## 2016-09-09 MED ORDER — VANCOMYCIN HCL IN DEXTROSE 1-5 GM/200ML-% IV SOLN: 1000.0000 mg | INTRAVENOUS | Status: DC

## 2016-09-09 MED ORDER — HEPARIN (PORCINE) IN NACL 100-0.45 UNIT/ML-% IJ SOLN
1100.0000 [IU]/h | INTRAMUSCULAR | Status: DC
  Filled 2016-09-09 (×2): qty 250

## 2016-09-09 MED ORDER — ATORVASTATIN CALCIUM 40 MG PO TABS
40.0000 mg | ORAL_TABLET | Freq: Every day | ORAL | Status: DC
  Administered 2016-09-09 – 2016-09-14 (×7): 40 mg via ORAL
  Filled 2016-09-09 (×7): qty 1

## 2016-09-09 MED ORDER — GABAPENTIN 100 MG PO CAPS
100.0000 mg | ORAL_CAPSULE | Freq: Every day | ORAL | Status: DC
  Administered 2016-09-09 – 2016-09-14 (×7): 100 mg via ORAL
  Filled 2016-09-09 (×7): qty 1

## 2016-09-09 MED ORDER — ACETAMINOPHEN 500 MG PO TABS: 500.0000 mg | ORAL_TABLET | Freq: Four times a day (QID) | ORAL | Status: DC | PRN

## 2016-09-09 MED ORDER — ISOSORBIDE MONONITRATE ER 60 MG PO TB24
60.0000 mg | ORAL_TABLET | Freq: Every day | ORAL | Status: DC
  Administered 2016-09-09 – 2016-09-15 (×7): 60 mg via ORAL
  Filled 2016-09-09 (×8): qty 1

## 2016-09-09 MED ORDER — LIDOCAINE 5 % EX OINT
1.0000 "application " | TOPICAL_OINTMENT | Freq: Three times a day (TID) | CUTANEOUS | Status: DC
  Administered 2016-09-09 – 2016-09-14 (×15): 1 via TOPICAL
  Filled 2016-09-09 (×3): qty 35.44

## 2016-09-09 MED ORDER — HEPARIN SODIUM (PORCINE) 5000 UNIT/ML IJ SOLN
5000.0000 [IU] | Freq: Three times a day (TID) | INTRAMUSCULAR | Status: DC
  Filled 2016-09-09: qty 1

## 2016-09-09 MED ORDER — DOCUSATE SODIUM 100 MG PO CAPS
100.0000 mg | ORAL_CAPSULE | Freq: Two times a day (BID) | ORAL | Status: DC
  Administered 2016-09-09 – 2016-09-15 (×14): 100 mg via ORAL
  Filled 2016-09-09 (×14): qty 1

## 2016-09-09 NOTE — ED Notes (Signed)
[facility] reports they can not get the patient until 1030 or 1100.  Also, CT reports they will send a transporter for the patient.

## 2016-09-09 NOTE — Progress Notes (Signed)
PHARMACIST - PHYSICIAN ORDER COMMUNICATION  CONCERNING: P&T Medication Policy on Herbal Medications  DESCRIPTION:  This patient's order for:  Melatonin  has been noted.  This product(s) is classified as an "herbal" or natural product. Due to a lack of definitive safety studies or FDA approval, nonstandard manufacturing practices, plus the potential risk of unknown drug-drug interactions while on inpatient medications, the Pharmacy and Therapeutics Committee does not permit the use of "herbal" or natural products of this type within [facility].   ACTION TAKEN: The pharmacy department is unable to verify this order at this time and your patient has been informed of this safety policy. Please reevaluate patient's clinical condition at discharge and address if the herbal or natural product(s) should be resumed at that time.   Thanks Dorrene German 09/09/2016 1:41 AM

## 2016-09-09 NOTE — Progress Notes (Signed)
[facility] for IV heparin Indication: pulmonary embolus (R/O)  No Known Allergies  Patient Measurements: Height: 5\' 4"  (162.6 cm) Weight: 169 lb (76.7 kg) IBW/kg (Calculated) : 54.7 Heparin Dosing Weight: 71 kg  Vital Signs: Temp: 98.2 F (36.8 C) (04/17 1954) Temp Source: Oral (04/17 1954) BP: 124/66 (04/17 1954) Pulse Rate: 66 (04/17 1954)  Labs:  Recent Labs  09/08/16 2314 09/09/16 0148 09/09/16 0601 09/09/16 0954 09/09/16 1914  HGB 8.6*  --  8.6*  --   --   HCT 25.6*  --  25.8*  --   --   PLT 248  --  248  --   --   APTT 36  --   --   --   --   LABPROT 14.8  --   --   --   --   INR 1.15  --   --   --   --   HEPARINUNFRC  --   --   --  <0.10* 0.18*  CREATININE 4.48*  --  4.75*  --   --   TROPONINI 0.03* 0.04*  --  0.04* 0.04*   Estimated Creatinine Clearance: 12 mL/min (A) (by C-G formula based on SCr of 4.75 mg/dL (H)).   Assessment: 52 [name] to ED with 3 day hx of [facility] after d/c from [facility] 4/14 s/p foot surgery. Hx of LE DVT, D-dimer elevated on admit,CKD 4, SCr 4.75, Cl ~ 12 ml/min Goal of Therapy:  Heparin level 0.3-0.7 units/ml Monitor platelets by anticoagulation protocol: Yes   Today, 09/09/2016  VQ scan low probability of PE Heparin level is low at 0.18 at after 3000 unit bolus and drip rate increase to 1100 units/hr No bleeding reported, RN reports no interruptions in drip   Plan:  On call [facility] MD texted: neg VQ - ? DC heparin  Re-bolus Heparin 3000 units, increase infusion to 1300 units/hr  Re-check Heparin level in 8 hr   Daily CBC, heparin level (when at steady state)   Eudelia Bunch, Pharm.D. [phone] 09/09/2016 8:40 PM

## 2016-09-09 NOTE — Progress Notes (Signed)
ANTICOAGULATION CONSULT NOTE - Initial Consult  Pharmacy Consult for IV heparin Indication: pulmonary embolus (R/O)  No Known Allergies  Patient Measurements: Height: 5\' 4"  (162.6 cm) Weight: 169 lb (76.7 kg) IBW/kg (Calculated) : 54.7 Heparin Dosing Weight: 71 kg  Vital Signs: Temp: 98.2 F (36.8 C) (04/17 1008) Temp Source: Oral (04/17 1008) BP: 158/85 (04/17 1008) Pulse Rate: 67 (04/17 1008)  Labs:  Recent Labs  09/08/16 2314 09/09/16 0148 09/09/16 0601 09/09/16 0954  HGB 8.6*  --  8.6*  --   HCT 25.6*  --  25.8*  --   PLT 248  --  248  --   APTT 36  --   --   --   LABPROT 14.8  --   --   --   INR 1.15  --   --   --   HEPARINUNFRC  --   --   --  <0.10*  CREATININE 4.48*  --  4.75*  --   TROPONINI 0.03* 0.04*  --  0.04*   Estimated Creatinine Clearance: 12 mL/min (A) (by C-G formula based on SCr of 4.75 mg/dL (H)).  Medications:  Scheduled:  . amLODipine  10 mg Oral Daily  . aspirin EC  81 mg Oral Daily  . atorvastatin  40 mg Oral QHS  . carvedilol  25 mg Oral BID WC  . chlorhexidine  15 mL Mouth Rinse BID  . docusate sodium  100 mg Oral BID  . ferrous sulfate  325 mg Oral BID WC  . furosemide  80 mg Intravenous Q12H  . gabapentin  100 mg Oral QHS  . hydrALAZINE  50 mg Oral BID  . insulin aspart  0-9 Units Subcutaneous TID WC  . insulin detemir  8 Units Subcutaneous QHS  . isosorbide mononitrate  60 mg Oral Daily  . lidocaine  1 application Topical TID  . mouth rinse  15 mL Mouth Rinse q12n4p  . nicotine  21 mg Transdermal Daily  . pantoprazole  20 mg Oral BID  . sodium chloride flush  3 mL Intravenous Q12H   Infusions:  . heparin 900 Units/hr (09/09/16 0304)   Assessment: 66 [name] to ED with 3 day hx of SHOB after d/c from [facility] 4/14 s/p foot surgery. Hx of LE DVT, D-dimer elevated on admit, no chest CT, rule out PE.  CKD 4, SCr 4.75, Cl ~ 12 ml/min  Goal of Therapy:  Heparin level 0.3-0.7 units/ml Monitor platelets by anticoagulation  protocol: Yes   Today, 09/09/2016  1st Heparin level < 0.1 units/ml after 2000 unit bolus, infusion at 900 units/hr   Plan:   Re-bolus Heparin 3000 units, increase infusion to 1100 units/hr  Re-check second Heparin level in 8 hr (8p)  Daily CBC, heparin level (when at steady state)  Minda Ditto PharmD Pager [phone] 09/09/2016, 11:59 AM

## 2016-09-09 NOTE — Progress Notes (Signed)
*  PRELIMINARY RESULTS* Vascular Ultrasound Bilateral lower extremity venous duplex has been completed.  Preliminary findings: No evidence of deep vein thrombosis in the visualized veins of the lower extremity.  negativefor baker's cysts bilaterally.   Everrett Coombe 09/09/2016, 10:57 AM

## 2016-09-09 NOTE — Progress Notes (Signed)
ANTICOAGULATION CONSULT NOTE - Initial Consult  Pharmacy Consult for IV heparin Indication: pulmonary embolus (R/O)  No Known Allergies  Patient Measurements: Height: 5\' 2"  (157.5 cm) Weight: 165 lb (74.8 kg) IBW/kg (Calculated) : 50.1 Heparin Dosing Weight: 57 kg  Vital Signs: Temp: 98.3 F (36.8 C) (04/16 2116) Temp Source: Oral (04/16 2116) BP: 163/72 (04/17 0145) Pulse Rate: 68 (04/17 0145)  Labs:  Recent Labs  09/08/16 2314 09/09/16 0148  HGB 8.6*  --   HCT 25.6*  --   PLT 248  --   CREATININE 4.48*  --   TROPONINI 0.03* 0.04*    Estimated Creatinine Clearance: 12 mL/min (A) (by C-G formula based on SCr of 4.48 mg/dL (H)).   Medical History: Past Medical History:  Diagnosis Date  . Anemia   . Anxiety   . Arthritis   . Benign hypertension   . Bipolar disorder ([place])   . CHF (congestive heart failure) ([place])   . Chronic kidney disease, stage V [facility])    Nephrologist is with [facility] (Dr. Tammi Klippel)  . Coronary artery disease   . Depression   . Diabetes mellitus without complication ([place])   . DVT (deep venous thrombosis) (HCC)    right lower leg  . GERD (gastroesophageal reflux disease)   . Heart murmur   . History of blood transfusion   . History of bronchitis   . History of pneumonia   . Shortness of breath dyspnea   . Sleep apnea   . Type 2 diabetes mellitus (H[facility])     Medications:  Scheduled:  . amLODipine  10 mg Oral Daily  . aspirin EC  81 mg Oral Daily  . atorvastatin  40 mg Oral QHS  . bumetanide  4 mg Oral q1800  . bumetanide  6 mg Oral QAC breakfast  . carvedilol  25 mg Oral BID WC  . docusate sodium  100 mg Oral BID  . ferrous sulfate  325 mg Oral BID WC  . gabapentin  100 mg Oral QHS  . hydrALAZINE  50 mg Oral BID  . insulin aspart  0-9 Units Subcutaneous TID WC  . insulin detemir  8 Units Subcutaneous QHS  . isosorbide mononitrate  60 mg Oral Daily  . lidocaine  1 application Topical TID  . nicotine  21 mg Transdermal Daily  .  pantoprazole  20 mg Oral BID  . sodium chloride flush  3 mL Intravenous Q12H   Infusions:    Assessment: 81 [name] c/o SOB.  IV heparin for r/o PE. D-dimer=1.23 and troponin = 0.04 Goal of Therapy:  Heparin level 0.3-0.7 units/ml Monitor platelets by anticoagulation protocol: Yes   Plan:  Baseline coags STAT Heparin 2000 unit bolus x1 Start drip at 900 units/hr Daily CBC/HL Check 1st HL in 8 hours  Dorrene German 09/09/2016,2:37 AM

## 2016-09-09 NOTE — ED Notes (Signed)
Informed the pt that a urine specimen is needed. 

## 2016-09-09 NOTE — Care Management Note (Signed)
Case Management Note  Patient Details  Name: Robin [name] MRN: [id number] Date of Birth: Nov 11, 1950  Subjective/Objective:        pna            Action/Plan:Date:  September 09, 2016 Chart reviewed for concurrent status and case management needs. Will continue to follow patient progress. Discharge Planning: following for needs Expected discharge date: [id number] Robin [name], BSN, [place], [place]   Expected Discharge Date:                  Expected Discharge Plan:  Home/Self Care  In-House Referral:     Discharge planning Services     Post Acute Care Choice:    Choice offered to:     DME Arranged:    DME Agency:     HH Arranged:    [place] Agency:     Status of Service:  In process, will continue to follow  If discussed at Long Length of Stay Meetings, dates discussed:    Additional Comments:  Leeroy Cha, RN 09/09/2016, 10:32 AM

## 2016-09-09 NOTE — Progress Notes (Signed)
PROGRESS NOTE  Robin [name]  CVE:[id number] DOB: 08-16-1950 DOA: 09/08/2016 PCP: [facility]:  Subjective: She still short of breath, has some nausea, no vomiting. Some cough but minimal sputum production.  Brief Narrative:  Robin [name] is a 66 y.o. female with medical history significant of hypertension, hyperlipidemia, diabetes mellitus, GERD, depression, right leg DVT, CAD, dCHF, anemia, chronic kidney disease-stage IV, tobacco abuse, who presents with shortness of breath, chest pain and abdominal pain.  Pt states that she had right foot surgery and had rehabilitation, and was recently discharged from skilled nursing on Saturday. She had SOB and was diagnosed with pneumonia at facility. She was started on doxycycline, but no prescription was given to family.   Patient states that she continues to have shortness of breath in the past 3 days. She does not have cough, but has chest ache. Her chest pain is not pleuritic. No fever or chills. She denies tenderness in the calf areas. Patient also reports abdominal pain which has been going on for 3 days. The abdominal pain is located in the mid abdomen, 5 out of 10 in severity, nonradiating. It is associated with nausea and vomiting. She vomited once yesterday. Patient states that she was constipated recently, which has resolved currently. No diarrhea. Patient denies symptoms of UTI or unilateral weakness.  Assessment & Plan:   Principal Problem:   Acute on chronic respiratory failure with hypoxia (HCC) Active Problems:   DM type 2, uncontrolled, with renal complications (HCC)   Hypokalemia   CAD (coronary artery disease), native coronary artery with 2 stents    Essential hypertension   Anemia, chronic renal failure   Hyperlipidemia   GERD (gastroesophageal reflux disease)   Depression   Acute renal failure superimposed on stage 4 chronic kidney disease (HCC)   Chronic diastolic (congestive) heart  failure (HCC)   Elevated troponin   Abdominal pain   This is a no charge note, patient seen earlier today by my colleague Dr. Blaine Hamper. Came in with acute hypoxic respiratory failure, on oxygen. This is likely secondary to acute interstitial pulmonary edema, needs diuresis, rule out PE.  Acute on chronic respiratory failure with hypoxia [facility]): Etiology is not clear. Patient had right foot surgery recently, which increases risk of getting blood clot. Her d-dimer is elevated at 1.23, making pulmonary embolism a potential differential diagnosis. Chest x-ray showed left-sided opacity, but patient does not have fever or leukocytosis, clinically does not seem to have pneumonia. Patient's BNP is elevated at 1379 indicating possible CHF exacerbation.    Hx of CAD and elevated trop: pt has chronically elevated troponin, baseline 0.04-0.11. Today her troponin is 0.03, which is at the baseline. Patient reports chest pain, which is possibly due to PE as discussed above -Follow-up troponin 3  -A1c and FLP -continue Coreg, Lipitor, aspirin, when necessary nitroglycerin  Abdominal pain: Etiology is not clear. X-ray of abdomen is negative. Lipase 20. -prn hydroxyzine for nausea (patient cannot use Zofran due to QTc prolongation) -When necessary morphine for pain -CT abdomen/pelvis without contrast  DM-II: Last A1c 7.7, not well controled. Patient is taking Levemir and Humalog at home -will decrease Levemir dose from  12-->8 units daily -SSI  HTN: -continue oral hydralazine, Coreg, amlodipine, -IV hydralazine when necessary  GERD: -Protonix  Depression: Stable, no suicidal or homicidal ideations. -home home medications: Abilify due to prolongation of QTC  HLD: -Continue home medications: lipitor  Anemia: Hemoglobin 8.6 which was 9.5 on 09/01/14 -Follow-up by CBC  morning  Acute hronic diastolic (congestive) heart failure -This is secondary to his CKD stage 4-5, chest x-ray showed  congestion and pleural effusion. Elevated BNP. -Increase diuresis, if no improvement she will need nephrology consultation.  AoCKD-IV: Baseline Cre is 3.2-3.7, pt's Cre is 4.8 on admission. Likely due to prerenal secondary to dehydration and continuation of diruetics - Follow up renal function by BMP  Tobacco abuse: -Did counseling about importance of quitting smoking -Nicotine patch  Hypokalemia -Replete with oral supplements.  DVT prophylaxis:  Code Status: Full Code Family Communication:  Disposition Plan:  Diet: Diet Carb Modified Fluid consistency: Thin; Room service appropriate? Yes  Consultants:   None  Procedures:   None  Antimicrobials:   None   Objective: Vitals:   09/09/16 0700 09/09/16 0730 09/09/16 0745 09/09/16 1008  BP: (!) 165/79 (!) 174/83 (!) 157/72 (!) 158/85  Pulse: 69 73 75 67  Resp: 19 13 17 18   Temp:    98.2 F (36.8 C)  TempSrc:    Oral  SpO2: 100% 98% 100% 96%  Weight:    76.7 kg (169 lb)  Height:    5\' 4"  (1.626 m)    Intake/Output Summary (Last 24 hours) at 09/09/16 1201 Last data filed at 09/09/16 1150  Gross per 24 hour  Intake              500 ml  Output              350 ml  Net              150 ml   Filed Weights   09/08/16 2133 09/09/16 1008  Weight: 74.8 kg (165 lb) 76.7 kg (169 lb)    Examination: General exam: Appears calm and comfortable  Respiratory system: Clear to auscultation. Respiratory effort normal. Cardiovascular system: S1 & S2 heard, RRR. No JVD, murmurs, rubs, gallops or clicks. No pedal edema. Gastrointestinal system: Abdomen is nondistended, soft and nontender. No organomegaly or masses felt. Normal bowel sounds heard. Central nervous system: Alert and oriented. No focal neurological deficits. Extremities: Symmetric 5 x 5 power. Skin: No rashes, lesions or ulcers Psychiatry: Judgement and insight appear normal. Mood & affect appropriate.   Data Reviewed: I have personally reviewed following labs and  imaging studies  CBC:  Recent Labs Lab 09/08/16 2314 09/09/16 0601  WBC 7.5 7.5  NEUTROABS 5.3  --   HGB 8.6* 8.6*  HCT 25.6* 25.8*  MCV 92.1 91.5  PLT 248 878   Basic Metabolic Panel:  Recent Labs Lab 09/08/16 2314 09/09/16 0601  NA 131* 131*  K 3.4* 3.3*  CL 102 100*  CO2 16* 18*  GLUCOSE 121* 136*  BUN 48* 52*  CREATININE 4.48* 4.75*  CALCIUM 8.8* 8.9   GFR: Estimated Creatinine Clearance: 12 mL/min (A) (by C-G formula based on SCr of 4.75 mg/dL (H)). Liver Function Tests:  Recent Labs Lab 09/08/16 2314  AST 17  ALT 17  ALKPHOS 78  BILITOT 0.9  PROT 7.1  ALBUMIN 3.6    Recent Labs Lab 09/08/16 2314  LIPASE 20   No results for input(s): AMMONIA in the last 168 hours. Coagulation Profile:  Recent Labs Lab 09/08/16 2314  INR 1.15   Cardiac Enzymes:  Recent Labs Lab 09/08/16 2314 09/09/16 0148 09/09/16 0954  TROPONINI 0.03* 0.04* 0.04*   BNP (last 3 results) No results for input(s): PROBNP in the last 8760 hours. HbA1C: No results for input(s): HGBA1C in the last 72 hours. CBG:  Recent Labs Lab 09/09/16 0822 09/09/16 1007  GLUCAP 139* 111*   Lipid Profile:  Recent Labs  09/09/16 0601  CHOL 77  HDL 32*  LDLCALC 25  TRIG 102  CHOLHDL 2.4   Thyroid Function Tests: No results for input(s): TSH, T4TOTAL, FREET4, T3FREE, THYROIDAB in the last 72 hours. Anemia Panel: No results for input(s): VITAMINB12, FOLATE, FERRITIN, TIBC, IRON, RETICCTPCT in the last 72 hours. Urine analysis:    Component Value Date/Time   COLORURINE YELLOW 09/09/2016 0337   APPEARANCEUR HAZY (A) 09/09/2016 0337   LABSPEC 1.010 09/09/2016 0337   PHURINE 5.0 09/09/2016 0337   GLUCOSEU NEGATIVE 09/09/2016 0337   HGBUR NEGATIVE 09/09/2016 0337   BILIRUBINUR NEGATIVE 09/09/2016 0337   KETONESUR NEGATIVE 09/09/2016 0337   PROTEINUR 100 (A) 09/09/2016 0337   UROBILINOGEN 0.2 01/21/2015 0416   NITRITE NEGATIVE 09/09/2016 0337   LEUKOCYTESUR NEGATIVE  09/09/2016 0337   Sepsis Labs: @LABRCNTIP (procalcitonin:4,lacticidven:4)  )No results found for this or any previous visit (from the past 240 hour(s)).   Invalid input(s): PROCALCITONIN, LACTICACIDVEN   Radiology Studies: Ct Abdomen Pelvis Wo Contrast  Result Date: 09/09/2016 CLINICAL DATA:  66 year old female with complaint of mid abdominal pain for the past 3 days with some associated nausea and vomiting. EXAM: CT ABDOMEN AND PELVIS WITHOUT CONTRAST TECHNIQUE: Multidetector CT imaging of the abdomen and pelvis was performed following the standard protocol without IV contrast. COMPARISON:  CT the abdomen and pelvis 02/26/2004. FINDINGS: Lower chest: Small bilateral pleural effusions with associated areas of passive subsegmental atelectasis in the lower lobes of the lungs bilaterally. Mild cardiomegaly. Atherosclerotic calcifications in the left anterior descending and right coronary arteries. Hepatobiliary: No definite cystic or solid hepatic lesions are identified on today's noncontrast CT examination. Status post cholecystectomy. Pancreas: No definite pancreatic mass or peripancreatic inflammatory changes are noted on today's noncontrast CT examination. Spleen: Unremarkable. Adrenals/Urinary Tract: No calcifications are identified within the collecting system of either kidney, along the course of either ureter, or within the lumen of the urinary bladder. There is no hydroureteronephrosis or perinephric stranding to suggest urinary tract obstruction at this time. Unenhanced appearance of the urinary bladder is normal. Bilateral adrenal glands are normal in appearance. Stomach/Bowel: The appearance of the stomach is normal. There is no pathologic dilatation of small bowel or colon. Normal appendix. Vascular/Lymphatic: Aortic atherosclerosis, without definite aneurysm in the abdominal or pelvic vasculature on today's noncontrast CT examination. No lymphadenopathy noted in the abdomen or pelvis.  Reproductive: Status post hysterectomy. Right ovary is unremarkable in appearance. Left ovary is not confidently identified may be surgically absent or atrophic. Other: No significant volume of ascites. No pneumoperitoneum. Mild diffuse body wall edema. Musculoskeletal: There are no aggressive appearing lytic or blastic lesions noted in the visualized portions of the skeleton. IMPRESSION: 1. No acute findings are noted in the abdomen or pelvis to account for the patient's symptoms. 2. Small bilateral pleural effusions with some associated passive subsegmental atelectasis in the dependent portions of the lower lobes of the lungs bilaterally. There is also cardiomegaly and mild diffuse body wall edema. These imaging findings could suggest underlying congestive heart failure. 3. Aortic atherosclerosis, in addition to at least 2 vessel coronary artery disease. Please note that although the presence of coronary artery calcium documents the presence of coronary artery disease, the severity of this disease and any potential stenosis cannot be assessed on this non-gated CT examination. Assessment for potential risk factor modification, dietary therapy or pharmacologic therapy may be warranted, if clinically indicated.  4. Normal appendix. 5. Additional incidental findings, as above. Electronically Signed   By: Vinnie Langton M.D.   On: 09/09/2016 09:07   Dg Chest 2 View  Result Date: 09/08/2016 CLINICAL DATA:  Shortness of breath EXAM: CHEST  2 VIEW COMPARISON:  08/31/2016 FINDINGS: Cardiomegaly with central vascular congestion and mild interstitial edema. Small bilateral left greater than right pleural effusions. Unable to rule out infiltrate at the left base. No pneumothorax. IMPRESSION: 1. Cardiomegaly with central vascular congestion and mild interstitial edema 2. Small bilateral effusions left greater than right. Increased opacity at the left lung base, may reflect atelectasis or an infiltrate Electronically  Signed   By: Donavan Foil M.D.   On: 09/08/2016 22:02   Dg Abd 2 Views  Result Date: 09/08/2016 CLINICAL DATA:  Mid abdominal pain with nausea EXAM: ABDOMEN - 2 VIEW COMPARISON:  None. FINDINGS: Surgical clips in the upper abdomen. Nonobstructed bowel-gas pattern. No gross free air on decubitus view. IMPRESSION: Nonobstructed bowel-gas pattern Electronically Signed   By: Donavan Foil M.D.   On: 09/08/2016 23:14        Scheduled Meds: . amLODipine  10 mg Oral Daily  . aspirin EC  81 mg Oral Daily  . atorvastatin  40 mg Oral QHS  . carvedilol  25 mg Oral BID WC  . chlorhexidine  15 mL Mouth Rinse BID  . docusate sodium  100 mg Oral BID  . ferrous sulfate  325 mg Oral BID WC  . furosemide  80 mg Intravenous Q12H  . gabapentin  100 mg Oral QHS  . heparin  3,000 Units Intravenous Once  . hydrALAZINE  50 mg Oral BID  . insulin aspart  0-9 Units Subcutaneous TID WC  . insulin detemir  8 Units Subcutaneous QHS  . isosorbide mononitrate  60 mg Oral Daily  . lidocaine  1 application Topical TID  . mouth rinse  15 mL Mouth Rinse q12n4p  . nicotine  21 mg Transdermal Daily  . pantoprazole  20 mg Oral BID  . sodium chloride flush  3 mL Intravenous Q12H   Continuous Infusions: . heparin       LOS: 0 days    Time spent: 35 minutes    Kaevion Sinclair A, MD [facility] Pager [phone]  If 7PM-7AM, please contact night-coverage [email or web address] Password [email or web address] 09/09/2016, 12:01 PM

## 2016-09-09 NOTE — ED Notes (Addendum)
Date and time results received: 09/09/16 0005 (use smartphrase ".now" to insert current time)  Test: trop Critical Value: 0.03  Name of Provider Notified: Hillard Danker  Orders Received? Or Actions Taken?: none at the time

## 2016-09-09 NOTE — Progress Notes (Signed)
RN was just notified about patient's 4 beat run of VT at 0957. Patient in no acute distress. MD notified. No new orders received. Will continue to monitor the patient.

## 2016-09-09 NOTE — H&P (Signed)
History and Physical    Robin [name] JKK:[id number] DOB: 1950/10/31 DOA: 09/08/2016  Referring MD/NP/PA:   PCP: [facility]   Patient coming from:  The patient is coming from home.  At baseline, pt is independent for most of ADL.   Chief Complaint: SOB, chest pain and abdominal pain  HPI: Robin [name] is a 66 y.o. female with medical history significant of hypertension, hyperlipidemia, diabetes mellitus, GERD, depression, right leg DVT, CAD, dCHF, anemia, chronic kidney disease-stage IV, tobacco abuse, who presents with shortness of breath, chest pain and abdominal pain.  Pt states that she had right foot surgery and had rehabilitation, and was recently discharged from skilled nursing on Saturday. She had SOB and was diagnosed with pneumonia at facility. She was started on doxycycline, but no prescription was given to family.   Patient states that she continues to have shortness of breath in the past 3 days. She does not have cough, but has chest ache. Her chest pain is not pleuritic. No fever or chills. She denies tenderness in the calf areas. Patient also reports abdominal pain which has been going on for 3 days. The abdominal pain is located in the mid abdomen, 5 out of 10 in severity, nonradiating. It is associated with nausea and vomiting. She vomited once yesterday. Patient states that she was constipated recently, which has resolved currently. No diarrhea. Patient denies symptoms of UTI or unilateral weakness.  ED Course: pt was found to have Elevated d-dimer 1.3, troponin 0.03, BNP 1379, lipase 20, potassium 3.4, worsening renal function, temperature normal, oxygen saturation 86% on room air, chest x-ray showed vascular congestion and the left side opacity. X ray of abdomen is negative. Pt is admitted tele bed as inpt.   Review of Systems:   General: no fevers, chills, no changes in body weight, has poor appetite, has fatigue HEENT: no blurry vision, hearing changes or  sore throat Respiratory: has dyspnea, no coughing, wheezing CV: has chest pain, no palpitations GI: has nausea, vomiting, abdominal pain, no diarrhea, constipation GU: no dysuria, burning on urination, increased urinary frequency, hematuria  Ext: no leg edema Neuro: no unilateral weakness, numbness, or tingling, no vision change or hearing loss Skin: no rash, no skin tear. MSK: No muscle spasm, no deformity, no limitation of range of movement in spin Heme: No easy bruising.  Travel history: No recent long distant travel.  Allergy: No Known Allergies  Past Medical History:  Diagnosis Date  . Anemia   . Anxiety   . Arthritis   . Benign hypertension   . Bipolar disorder ([place])   . CHF (congestive heart failure) ([place])   . Chronic kidney disease, stage V [facility])    Nephrologist is with [facility] (Dr. Tammi Klippel)  . Coronary artery disease   . Depression   . Diabetes mellitus without complication ([place])   . DVT (deep venous thrombosis) (HCC)    right lower leg  . GERD (gastroesophageal reflux disease)   . Heart murmur   . History of blood transfusion   . History of bronchitis   . History of pneumonia   . Shortness of breath dyspnea   . Sleep apnea   . Type 2 diabetes mellitus ([place])     Past Surgical History:  Procedure Laterality Date  . ABDOMINAL HYSTERECTOMY    . ANKLE CLOSED REDUCTION N/A 11/17/2014   Procedure: CLOSED REDUCTION ANKLE;  Surgeon: Earlie Server, MD;  Location: [place];  Service: Orthopedics;  Laterality: N/A;  .  ANKLE FUSION Left 03/16/2015   Procedure: Left Tibiocalcaneal Fusion;  Surgeon: Newt Minion, MD;  Location: [place];  Service: Orthopedics;  Laterality: Left;  . APPLICATION OF WOUND VAC Right 08/06/2016   Procedure: APPLICATION OF PREVENA WOUND VAC;  Surgeon: Newt Minion, MD;  Location: [place];  Service: Orthopedics;  Laterality: Right;  . AV FISTULA PLACEMENT Left [id number]   done at [place]     2 stent   . CHOLECYSTECTOMY     . CORONARY STENT PLACEMENT    . FOOT ARTHRODESIS Right 08/06/2016   Procedure: Right Foot Fusion Lisfranc Joint;  Surgeon: Newt Minion, MD;  Location: [place];  Service: Orthopedics;  Laterality: Right;  . HARDWARE REMOVAL Left 03/16/2015   Procedure: Removal Hardware Left Ankle;  Surgeon: Newt Minion, MD;  Location: [place];  Service: Orthopedics;  Laterality: Left;  . ORIF ANKLE FRACTURE Left 11/20/2014   Procedure: OPEN REDUCTION INTERNAL FIXATION (ORIF) ANKLE FRACTURE;  Surgeon: Renette Butters, MD;  Location: [place];  Service: Orthopedics;  Laterality: Left;  . TONSILLECTOMY      Social History:  reports that she has been smoking Cigarettes.  She has been smoking about 1.00 pack per day. She has never used smokeless tobacco. She reports that she does not drink alcohol or use drugs.  Family History:  Family History  Problem Relation Age of Onset  . Family history unknown: Yes     Prior to Admission medications   Medication Sig Start Date End Date Taking? Authorizing Provider  acetaminophen (TYLENOL) 500 MG tablet Take 500 mg by mouth every 6 (six) hours as needed for mild pain, moderate pain, fever or headache.   Yes Historical Provider, MD  amLODipine (NORVASC) 10 MG tablet Take 10 mg by mouth daily.    Yes Historical Provider, MD  ammonium lactate (LAC-HYDRIN) 12 % lotion Apply 1 application topically 2 (two) times daily as needed (for itching/irritation).    Yes Historical Provider, MD  ARIPiprazole (ABILIFY) 15 MG tablet Take 7.5 mg by mouth daily.    Yes Historical Provider, MD  ARIPiprazole (ABILIFY) 5 MG tablet Take 2.5 mg by mouth at bedtime as needed (for agitation).    Yes Historical Provider, MD  aspirin EC 81 MG tablet Take 81 mg by mouth daily.   Yes Historical Provider, MD  atorvastatin (LIPITOR) 40 MG tablet Take 40 mg by mouth at bedtime.    Yes Historical Provider, MD  bumetanide (BUMEX) 2 MG tablet Take 4-6 mg by mouth 2 (two) times daily. Pt takes three tablets in  the morning and two at bedtime.   Yes Historical Provider, MD  carvedilol (COREG) 25 MG tablet Take 25 mg by mouth 2 (two) times daily with a meal.   Yes Historical Provider, MD  dextrose (GLUTOSE) 40 % GEL Take 1 Tube by mouth once as needed (for blood sugar less than 60).    Yes Historical Provider, MD  docusate sodium (COLACE) 100 MG capsule Take 100 mg by mouth 2 (two) times daily.   Yes Historical Provider, MD  ferrous sulfate 325 (65 FE) MG tablet Take 325 mg by mouth 2 (two) times daily with a meal.   Yes Historical Provider, MD  gabapentin (NEURONTIN) 100 MG capsule Take 100 mg by mouth at bedtime.   Yes Historical Provider, MD  hydrALAZINE (APRESOLINE) 50 MG tablet Take 50 mg by mouth 2 (two) times daily.    Yes Historical Provider, MD  insulin detemir (LEVEMIR) 100 UNIT/ML injection Inject 12 Units into the skin at bedtime.    Yes Historical Provider, MD  insulin lispro (HUMALOG) 100 UNIT/ML injection Inject 2-10 Units into the skin 3 (three) times daily before meals. Pt uses as needed per sliding scale:    150-200:  2 units 201-250:  4 units 251-300:  6 units 301-350:  8 units 351-400:  10 units and call MD   Yes Historical Provider, MD  isosorbide mononitrate (IMDUR) 60 MG 24 hr tablet Take 60 mg by mouth daily.    Yes Historical Provider, MD  lidocaine (LMX) 4 % cream Apply 1 application topically 3 (three) times daily.   Yes Historical Provider, MD  Melatonin 3 MG TABS Take 6 mg by mouth at bedtime as needed (for sleep).   Yes Historical Provider, MD  nicotine (NICODERM CQ - DOSED IN MG/24 HOURS) 21 mg/24hr patch Place 21 mg onto the skin daily.    Yes Historical Provider, MD  nitroGLYCERIN (NITROSTAT) 0.4 MG SL tablet Place 0.4 mg under the tongue every 5 (five) minutes as needed for chest pain.   Yes Historical Provider, MD  oxyCODONE-acetaminophen (PERCOCET/ROXICET) 5-325 MG tablet Take 0.5-1 tablets by mouth every 4 (four) hours as needed for severe pain.    Yes Historical  Provider, MD  pantoprazole (PROTONIX) 20 MG tablet Take 20 mg by mouth 2 (two) times daily.    Yes Historical Provider, MD  polyethylene glycol (MIRALAX / GLYCOLAX) packet Take 17 g by mouth daily as needed for mild constipation.    Yes Historical Provider, MD  sennosides-docusate sodium (SENOKOT-S) 8.6-50 MG tablet Take 2 tablets by mouth 2 (two) times daily as needed for constipation.    Yes Historical Provider, MD  Vitamin D, Ergocalciferol, (DRISDOL) 50000 units CAPS capsule Take 50,000 Units by mouth every Monday.   Yes Historical Provider, MD    Physical Exam: Vitals:   09/09/16 0215 09/09/16 0230 09/09/16 0245 09/09/16 0300  BP: (!) 152/68 (!) 161/65 (!) 141/69 (!) 153/76  Pulse: 68 64 69 67  Resp: 17 17 19 16   Temp:      TempSrc:      SpO2: 91% 93% 94% 95%  Weight:      Height:       General: Not in acute distress HEENT:       Eyes: PERRL, EOMI, no scleral icterus.       ENT: No discharge from the ears and nose, no pharynx injection, no tonsillar enlargement.        Neck: No JVD, no bruit, no mass felt. Heme: No neck lymph node enlargement. Cardiac: S1/S2, RRR, No murmurs, No gallops or rubs. Respiratory:  No rales, wheezing, rhonchi or rubs. GI: Soft, nondistended, mild tenderness in mid abdomen, no rebound pain, no organomegaly, BS present. GU: No hematuria Ext: No pitting leg edema bilaterally. 2+DP/PT pulse bilaterally. Musculoskeletal: No joint deformities, No joint redness or warmth, no limitation of ROM in spin. Skin: No rashes.  Neuro: Alert, oriented X3, cranial nerves II-XII grossly intact, moves all extremities normally.  Psych: Patient is not psychotic, no suicidal or hemocidal ideation.  Labs on Admission: I have personally reviewed following labs and imaging studies  CBC:  Recent Labs Lab 09/08/16 2314  WBC 7.5  NEUTROABS 5.3  HGB 8.6*  HCT 25.6*  MCV 92.1  PLT 213   Basic Metabolic Panel:  Recent Labs Lab 09/08/16 2314  NA 131*  K 3.4*    CL 102  CO2  16*  GLUCOSE 121*  BUN 48*  CREATININE 4.48*  CALCIUM 8.8*   GFR: Estimated Creatinine Clearance: 12 mL/min (A) (by C-G formula based on SCr of 4.48 mg/dL (H)). Liver Function Tests:  Recent Labs Lab 09/08/16 2314  AST 17  ALT 17  ALKPHOS 78  BILITOT 0.9  PROT 7.1  ALBUMIN 3.6    Recent Labs Lab 09/08/16 2314  LIPASE 20   No results for input(s): AMMONIA in the last 168 hours. Coagulation Profile:  Recent Labs Lab 09/08/16 2314  INR 1.15   Cardiac Enzymes:  Recent Labs Lab 09/08/16 2314 09/09/16 0148  TROPONINI 0.03* 0.04*   BNP (last 3 results) No results for input(s): PROBNP in the last 8760 hours. HbA1C: No results for input(s): HGBA1C in the last 72 hours. CBG: No results for input(s): GLUCAP in the last 168 hours. Lipid Profile: No results for input(s): CHOL, HDL, LDLCALC, TRIG, CHOLHDL, LDLDIRECT in the last 72 hours. Thyroid Function Tests: No results for input(s): TSH, T4TOTAL, FREET4, T3FREE, THYROIDAB in the last 72 hours. Anemia Panel: No results for input(s): VITAMINB12, FOLATE, FERRITIN, TIBC, IRON, RETICCTPCT in the last 72 hours. Urine analysis:    Component Value Date/Time   COLORURINE YELLOW 09/09/2016 0337   APPEARANCEUR HAZY (A) 09/09/2016 0337   LABSPEC 1.010 09/09/2016 0337   PHURINE 5.0 09/09/2016 0337   GLUCOSEU NEGATIVE 09/09/2016 0337   HGBUR NEGATIVE 09/09/2016 0337   BILIRUBINUR NEGATIVE 09/09/2016 0337   KETONESUR NEGATIVE 09/09/2016 0337   PROTEINUR 100 (A) 09/09/2016 0337   UROBILINOGEN 0.2 01/21/2015 0416   NITRITE NEGATIVE 09/09/2016 0337   LEUKOCYTESUR NEGATIVE 09/09/2016 0337   Sepsis Labs: @LABRCNTIP (procalcitonin:4,lacticidven:4) )No results found for this or any previous visit (from the past 240 hour(s)).   Radiological Exams on Admission: Dg Chest 2 View  Result Date: 09/08/2016 CLINICAL DATA:  Shortness of breath EXAM: CHEST  2 VIEW COMPARISON:  08/31/2016 FINDINGS: Cardiomegaly with  central vascular congestion and mild interstitial edema. Small bilateral left greater than right pleural effusions. Unable to rule out infiltrate at the left base. No pneumothorax. IMPRESSION: 1. Cardiomegaly with central vascular congestion and mild interstitial edema 2. Small bilateral effusions left greater than right. Increased opacity at the left lung base, may reflect atelectasis or an infiltrate Electronically Signed   By: Donavan Foil M.D.   On: 09/08/2016 22:02   Dg Abd 2 Views  Result Date: 09/08/2016 CLINICAL DATA:  Mid abdominal pain with nausea EXAM: ABDOMEN - 2 VIEW COMPARISON:  None. FINDINGS: Surgical clips in the upper abdomen. Nonobstructed bowel-gas pattern. No gross free air on decubitus view. IMPRESSION: Nonobstructed bowel-gas pattern Electronically Signed   By: Donavan Foil M.D.   On: 09/08/2016 23:14     EKG: Independently reviewed.  Sinus rhythm, QTC 6-28, PVC, PA-C, poor R-wave progression   Assessment/Plan Principal Problem:   Acute on chronic respiratory failure with hypoxia (HCC) Active Problems:   DM type 2, uncontrolled, with renal complications (HCC)   Hypokalemia   CAD (coronary artery disease), native coronary artery with 2 stents    Essential hypertension   Anemia, chronic renal failure   Hyperlipidemia   GERD (gastroesophageal reflux disease)   Depression   Acute renal failure superimposed on stage 4 chronic kidney disease (HCC)   Chronic diastolic (congestive) heart failure (HCC)   Elevated troponin   Abdominal pain   Acute on chronic respiratory failure with hypoxia ([place]): Etiology is not clear. Patient had right foot surgery recently, which increases risk of getting  blood clot. Her d-dimer is elevated at 1.23, making pulmonary embolism a potential differential diagnosis. Chest x-ray showed left-sided opacity, but patient does not have fever or leukocytosis, clinically does not seem to have pneumonia. Patient's BNP is elevated at 1379 indicating  possible CHF exacerbation, but this is in the setting of worsening renal function of CKD-IV. Pt does not have leg edema or JVD, clinically does not seem to have CHF exacerbation. In addition, patient may have PE, will not escalate diuretics.   -admit to tele bed as inpt -heparin drip initiated -2D echocardiogram ordered -LE dopplers ordered to evaluate for DVT -trop x 3 -Hypercoag panel -pain control: When necessary morphine   Hx of CAD and elevated trop: pt has chronically elevated troponin, baseline 0.04-0.11. Today her troponin is 0.03, which is at the baseline. Patient reports chest pain, which is possibly due to PE as discussed above -Follow-up troponin 3  -A1c and FLP -repeat EKG in am -continue Coreg, Lipitor, aspirin, when necessary nitroglycerin  Abdominal pain: Etiology is not clear. X-ray of abdomen is negative. Lipase 20. -prn hydroxyzine for nausea (patient cannot use Zofran due to QTc prolongation) -When necessary morphine for pain -CT abdomen/pelvis without contrast  DM-II: Last A1c 7.7, not well controled. Patient is taking Levemir and Humalog at home -will decrease Levemir dose from  12-->8 units daily -SSI  HTN: -continue oral hydralazine, Coreg, amlodipine, -IV hydralazine when necessary  GERD: -Protonix  Depression: Stable, no suicidal or homicidal ideations. -home home medications: Abilify due to prolongation of QTC  HLD: -Continue home medications: lipitor  Anemia: Hemoglobin 8.6 which was 9.5 on 09/01/14 -Follow-up by CBC morning  Chronic diastolic (congestive) heart failure: 2-D echo on 11/09/16 showed EF 50-55 percent with grade 2 diastolic dysfunction.  BNP is elevated at 1379 indicating possible CHF exacerbation, but this is in the setting of worsening renal function of CKD-IV. Pt does not have leg edema or JVD, clinically does not seem to have CHF exacerbation. In addition, patient may have PE, will not escalate diuretics.  -continue home  Bunex -continue coreg  AoCKD-IV: Baseline Cre is 3.2-3.7, pt's Cre is 4.8 on admission. Likely due to prerenal secondary to dehydration and continuation of diruetics - Follow up renal function by BMP - will not escalate diuretics.  Tobacco abuse: -Did counseling about importance of quitting smoking -Nicotine patch   DVT ppx: on IV Heparin    Code Status: Full code Family Communication: None at bed side.    Disposition Plan:  Anticipate discharge back to previous home environment Consults called:  none Admission status: Inpatient/tele      Date of Service 09/09/2016    Ivor Costa [facility] Pager [phone]  If 7PM-7AM, please contact night-coverage [email or web address] Password [email or web address] 09/09/2016, 4:13 AM

## 2016-09-09 NOTE — ED Notes (Signed)
Unsuccessful IV and blood draw attempt x 2.  Will notify 5E RN.

## 2016-09-09 NOTE — ED Notes (Signed)
Robin [name] Daughter [phone]

## 2016-09-10 DIAGNOSIS — J189 Pneumonia, unspecified organism: Secondary | ICD-10-CM

## 2016-09-10 DIAGNOSIS — E872 Acidosis: Secondary | ICD-10-CM

## 2016-09-10 DIAGNOSIS — N289 Disorder of kidney and ureter, unspecified: Secondary | ICD-10-CM

## 2016-09-10 DIAGNOSIS — E782 Mixed hyperlipidemia: Secondary | ICD-10-CM

## 2016-09-10 DIAGNOSIS — I2510 Atherosclerotic heart disease of native coronary artery without angina pectoris: Secondary | ICD-10-CM

## 2016-09-10 LAB — RENAL FUNCTION PANEL
ALBUMIN: 3.3 g/dL — AB (ref 3.5–5.0)
Anion gap: 11 (ref 5–15)
BUN: 52 mg/dL — AB (ref 6–20)
CALCIUM: 8.7 mg/dL — AB (ref 8.9–10.3)
CO2: 19 mmol/L — ABNORMAL LOW (ref 22–32)
Chloride: 99 mmol/L — ABNORMAL LOW (ref 101–111)
Creatinine, Ser: 4.56 mg/dL — ABNORMAL HIGH (ref 0.44–1.00)
GFR calc Af Amer: 11 mL/min — ABNORMAL LOW (ref >60)
GFR, EST NON AFRICAN AMERICAN: 9 mL/min — AB (ref >60)
GLUCOSE: 105 mg/dL — AB (ref 65–99)
PHOSPHORUS: 5.5 mg/dL — AB (ref 2.5–4.6)
Potassium: 3.5 mmol/L (ref 3.5–5.1)
Sodium: 129 mmol/L — ABNORMAL LOW (ref 135–145)

## 2016-09-10 LAB — CBC
HCT: 23.8 % — ABNORMAL LOW (ref 36.0–46.0)
Hemoglobin: 8 g/dL — ABNORMAL LOW (ref 12.0–15.0)
MCH: 31 pg (ref 26.0–34.0)
MCHC: 33.6 g/dL (ref 30.0–36.0)
MCV: 92.2 fL (ref 78.0–100.0)
PLATELETS: 188 10*3/uL (ref 150–400)
RBC: 2.58 MIL/uL — ABNORMAL LOW (ref 3.87–5.11)
RDW: 17.4 % — AB (ref 11.5–15.5)
WBC: 5.7 10*3/uL (ref 4.0–10.5)

## 2016-09-10 LAB — GLUCOSE, CAPILLARY
GLUCOSE-CAPILLARY: 109 mg/dL — AB (ref 65–99)
Glucose-Capillary: 84 mg/dL (ref 65–99)
Glucose-Capillary: 111 mg/dL — ABNORMAL HIGH (ref 65–99)
Glucose-Capillary: 128 mg/dL — ABNORMAL HIGH (ref 65–99)

## 2016-09-10 LAB — HEMOGLOBIN A1C
Hgb A1c MFr Bld: 6.7 % — ABNORMAL HIGH (ref 4.8–5.6)
Mean Plasma Glucose: 146 mg/dL

## 2016-09-10 MED ORDER — HEPARIN SODIUM (PORCINE) 5000 UNIT/ML IJ SOLN
5000.0000 [IU] | Freq: Three times a day (TID) | INTRAMUSCULAR | Status: DC
  Administered 2016-09-10 – 2016-09-15 (×15): 5000 [IU] via SUBCUTANEOUS
  Filled 2016-09-10 (×12): qty 1

## 2016-09-10 NOTE — Progress Notes (Signed)
Patient refused morning vital signs at this time and requested to have them taken at a later time so she can sleep. Patient A&O NAD noted at this tiem

## 2016-09-10 NOTE — Progress Notes (Signed)
PROGRESS NOTE    [name]  YIF:[id number] DOB: February 17, 1951 DOA: 09/08/2016 PCP: [place]   Brief Narrative: Robin [name] is a 66 y.o. femalewith medical history significant of hypertension, hyperlipidemia, diabetes mellitus, GERD, depression, right leg DVT, CAD, dCHF, anemia, chronic kidney disease-stage IV, tobacco abuse. She presented with dyspnea, chest pain and abdominal pain and found to have a CHF exacerbation. She is being diuresed.   Assessment & Plan:   Principal Problem:   Acute on chronic respiratory failure with hypoxia (HCC) Active Problems:   DM type 2, uncontrolled, with renal complications (HCC)   Hypokalemia   CAD (coronary artery disease), native coronary artery with 2 stents    Essential hypertension   Anemia, chronic renal failure   Hyperlipidemia   GERD (gastroesophageal reflux disease)   Depression   Acute renal failure superimposed on stage 4 chronic kidney disease (HCC)   Chronic diastolic (congestive) heart failure (HCC)   Elevated troponin   Abdominal pain   Acute on chronic respiratory failure with hypoxia Likely secondary to CHF. Complicated by poor renal function. -continue diuresis -nephrology consult to help with diuresis -wean O2  History of CAD Elevated troponin Flat trend. Asymptomatic.  Abdominal pain Improved.  Diabetes mellitus, type II A1C of 7.7 -continue Levemir -SSI  Essential hypertension -continue hydralazine, coreg and amlodipine  GERD -continue Protonix  Depression Stable. -holding Abilify secondary to prolonged QTc  Hyperlipidemia -continue Lipitor  Anemia Down a little bit today to 8 from 8.6. No evidence of bleeding. Asymptomatic. -repeat CBC  Acute on chronic CHF -lasix 80mg  BID -nephrology consult  Acute on chronic kidney disease, stage IV No outpatient nephrologist. CHF exacerbation likely contributing -diuresis as above -nephrology as above  Tobacco abuse -continue  nicotine patch  Hypokalemia Supplemented with potassium   DVT prophylaxis: Heparin Code Status: Full code Family Communication: None at bedside Disposition Plan: Discharge pending medical stability   Consultants:   Nephrology  Procedures:   None  Antimicrobials:  None    Subjective: Patient reports no issues overnight. No chest pain or dyspnea.  Objective: Vitals:   09/09/16 1954 09/09/16 2117 09/10/16 0700 09/10/16 0941  BP: 124/66 (!) 108/58 120/63   Pulse: 66 65 67   Resp: 18  17   Temp: 98.2 F (36.8 C) 98.7 F (37.1 C) 98.3 F (36.8 C)   TempSrc: Oral Oral Oral   SpO2: 94% 98% (!) 83% 97%  Weight:   82.7 kg (182 lb 5.1 oz)   Height:   5\' 4"  (1.626 m)     Intake/Output Summary (Last 24 hours) at 09/10/16 0943 Last data filed at 09/10/16 0016  Gross per 24 hour  Intake            160.7 ml  Output              700 ml  Net           -539.3 ml   Filed Weights   09/08/16 2133 09/09/16 1008 09/10/16 0700  Weight: 74.8 kg (165 lb) 76.7 kg (169 lb) 82.7 kg (182 lb 5.1 oz)    Examination:  General exam: Appears calm and comfortable Respiratory system: Bilateral crackles. Respiratory effort normal. Cardiovascular system: S1 & S2 heard, RRR. No murmurs. Gastrointestinal system: Abdomen is nondistended, soft and nontender. Normal bowel sounds heard. Central nervous system: Alert and oriented. No focal neurological deficits. Extremities: No edema. No calf tenderness Skin: No cyanosis. No rashes Psychiatry: Judgement and insight appear normal.  Mood & affect appropriate.     Data Reviewed: I have personally reviewed following labs and imaging studies  CBC:  Recent Labs Lab 09/08/16 2314 09/09/16 0601 09/10/16 0722  WBC 7.5 7.5 5.7  NEUTROABS 5.3  --   --   HGB 8.6* 8.6* 8.0*  HCT 25.6* 25.8* 23.8*  MCV 92.1 91.5 92.2  PLT 248 248 371   Basic Metabolic Panel:  Recent Labs Lab 09/08/16 2314 09/09/16 0601 09/10/16 0722  NA 131* 131* 129*    K 3.4* 3.3* 3.5  CL 102 100* 99*  CO2 16* 18* 19*  GLUCOSE 121* 136* 105*  BUN 48* 52* 52*  CREATININE 4.48* 4.75* 4.56*  CALCIUM 8.8* 8.9 8.7*  PHOS  --   --  5.5*   GFR: Estimated Creatinine Clearance: 13 mL/min (A) (by C-G formula based on SCr of 4.56 mg/dL (H)). Liver Function Tests:  Recent Labs Lab 09/08/16 2314 09/10/16 0722  AST 17  --   ALT 17  --   ALKPHOS 78  --   BILITOT 0.9  --   PROT 7.1  --   ALBUMIN 3.6 3.3*    Recent Labs Lab 09/08/16 2314  LIPASE 20   No results for input(s): AMMONIA in the last 168 hours. Coagulation Profile:  Recent Labs Lab 09/08/16 2314  INR 1.15   Cardiac Enzymes:  Recent Labs Lab 09/08/16 2314 09/09/16 0148 09/09/16 0954 09/09/16 1914  TROPONINI 0.03* 0.04* 0.04* 0.04*   BNP (last 3 results) No results for input(s): PROBNP in the last 8760 hours. HbA1C:  Recent Labs  09/09/16 0601  HGBA1C 6.7*   CBG:  Recent Labs Lab 09/09/16 1224 09/09/16 1455 09/09/16 1709 09/09/16 1959 09/10/16 0731  GLUCAP 104* 79 96 138* 109*   Lipid Profile:  Recent Labs  09/09/16 0601  CHOL 77  HDL 32*  LDLCALC 25  TRIG 102  CHOLHDL 2.4   Thyroid Function Tests: No results for input(s): TSH, T4TOTAL, FREET4, T3FREE, THYROIDAB in the last 72 hours. Anemia Panel: No results for input(s): VITAMINB12, FOLATE, FERRITIN, TIBC, IRON, RETICCTPCT in the last 72 hours. Sepsis Labs: No results for input(s): PROCALCITON, LATICACIDVEN in the last 168 hours.  No results found for this or any previous visit (from the past 240 hour(s)).       Radiology Studies: Ct Abdomen Pelvis Wo Contrast  Result Date: 09/09/2016 CLINICAL DATA:  66 year old female with complaint of mid abdominal pain for the past 3 days with some associated nausea and vomiting. EXAM: CT ABDOMEN AND PELVIS WITHOUT CONTRAST TECHNIQUE: Multidetector CT imaging of the abdomen and pelvis was performed following the standard protocol without IV contrast.  COMPARISON:  CT the abdomen and pelvis 02/26/2004. FINDINGS: Lower chest: Small bilateral pleural effusions with associated areas of passive subsegmental atelectasis in the lower lobes of the lungs bilaterally. Mild cardiomegaly. Atherosclerotic calcifications in the left anterior descending and right coronary arteries. Hepatobiliary: No definite cystic or solid hepatic lesions are identified on today's noncontrast CT examination. Status post cholecystectomy. Pancreas: No definite pancreatic mass or peripancreatic inflammatory changes are noted on today's noncontrast CT examination. Spleen: Unremarkable. Adrenals/Urinary Tract: No calcifications are identified within the collecting system of either kidney, along the course of either ureter, or within the lumen of the urinary bladder. There is no hydroureteronephrosis or perinephric stranding to suggest urinary tract obstruction at this time. Unenhanced appearance of the urinary bladder is normal. Bilateral adrenal glands are normal in appearance. Stomach/Bowel: The appearance of the stomach is normal. There  is no pathologic dilatation of small bowel or colon. Normal appendix. Vascular/Lymphatic: Aortic atherosclerosis, without definite aneurysm in the abdominal or pelvic vasculature on today's noncontrast CT examination. No lymphadenopathy noted in the abdomen or pelvis. Reproductive: Status post hysterectomy. Right ovary is unremarkable in appearance. Left ovary is not confidently identified may be surgically absent or atrophic. Other: No significant volume of ascites. No pneumoperitoneum. Mild diffuse body wall edema. Musculoskeletal: There are no aggressive appearing lytic or blastic lesions noted in the visualized portions of the skeleton. IMPRESSION: 1. No acute findings are noted in the abdomen or pelvis to account for the patient's symptoms. 2. Small bilateral pleural effusions with some associated passive subsegmental atelectasis in the dependent portions of  the lower lobes of the lungs bilaterally. There is also cardiomegaly and mild diffuse body wall edema. These imaging findings could suggest underlying congestive heart failure. 3. Aortic atherosclerosis, in addition to at least 2 vessel coronary artery disease. Please note that although the presence of coronary artery calcium documents the presence of coronary artery disease, the severity of this disease and any potential stenosis cannot be assessed on this non-gated CT examination. Assessment for potential risk factor modification, dietary therapy or pharmacologic therapy may be warranted, if clinically indicated. 4. Normal appendix. 5. Additional incidental findings, as above. Electronically Signed   By: Vinnie Langton M.D.   On: 09/09/2016 09:07   Dg Chest 2 View  Result Date: 09/08/2016 CLINICAL DATA:  Shortness of breath EXAM: CHEST  2 VIEW COMPARISON:  08/31/2016 FINDINGS: Cardiomegaly with central vascular congestion and mild interstitial edema. Small bilateral left greater than right pleural effusions. Unable to rule out infiltrate at the left base. No pneumothorax. IMPRESSION: 1. Cardiomegaly with central vascular congestion and mild interstitial edema 2. Small bilateral effusions left greater than right. Increased opacity at the left lung base, may reflect atelectasis or an infiltrate Electronically Signed   By: Donavan Foil M.D.   On: 09/08/2016 22:02   Nm Pulmonary Perf And Vent  Result Date: 09/09/2016 CLINICAL DATA:  Shortness of breath and mild chest pain. EXAM: NUCLEAR MEDICINE VENTILATION - PERFUSION LUNG SCAN TECHNIQUE: Ventilation images were obtained in multiple projections using inhaled aerosol Tc-30m DTPA. Perfusion images were obtained in multiple projections after intravenous injection of Tc-47m MAA. RADIOPHARMACEUTICALS:  32 mCi Technetium-3m DTPA aerosol inhalation and 4.2 mCi Technetium-72m MAA IV COMPARISON:  Chest x-ray from yesterday FINDINGS: Defects related to mediastinal  structures including cardiomegaly. Apparent left upper lobe posterior defect is best attributed to vasculature. Some fissural widening is noted in this patient with pleural effusions. No mismatched defects typical of pulmonary embolism. Some central airway deposition from turbulent air flow. IMPRESSION: Very low probability for pulmonary embolism by PIOPED 2. Electronically Signed   By: Monte Fantasia M.D.   On: 09/09/2016 14:39   Dg Abd 2 Views  Result Date: 09/08/2016 CLINICAL DATA:  Mid abdominal pain with nausea EXAM: ABDOMEN - 2 VIEW COMPARISON:  None. FINDINGS: Surgical clips in the upper abdomen. Nonobstructed bowel-gas pattern. No gross free air on decubitus view. IMPRESSION: Nonobstructed bowel-gas pattern Electronically Signed   By: Donavan Foil M.D.   On: 09/08/2016 23:14        Scheduled Meds: . amLODipine  10 mg Oral Daily  . aspirin EC  81 mg Oral Daily  . atorvastatin  40 mg Oral QHS  . carvedilol  25 mg Oral BID WC  . chlorhexidine  15 mL Mouth Rinse BID  . docusate sodium  100 mg  Oral BID  . ferrous sulfate  325 mg Oral BID WC  . furosemide  80 mg Intravenous Q12H  . gabapentin  100 mg Oral QHS  . hydrALAZINE  50 mg Oral BID  . insulin aspart  0-9 Units Subcutaneous TID WC  . insulin detemir  8 Units Subcutaneous QHS  . isosorbide mononitrate  60 mg Oral Daily  . lidocaine  1 application Topical TID  . loratadine  10 mg Oral Daily  . mouth rinse  15 mL Mouth Rinse q12n4p  . nicotine  21 mg Transdermal Daily  . pantoprazole  20 mg Oral BID  . sodium chloride flush  3 mL Intravenous Q12H  . technetium TC 31M diethylenetriame-pentaacetic acid  32 millicurie Intravenous Once   Continuous Infusions:   LOS: 1 day     Cordelia Poche, MD [facility] 09/10/2016, 9:43 AM Pager: [phone]  If 7PM-7AM, please contact night-coverage [email or web address] Password [name] 09/10/2016, 9:43 AM

## 2016-09-10 NOTE — Consult Note (Signed)
Renal Service Consult Note [facility]  Robin [name] 09/10/2016 [name] Requesting Physician:  Dr Lonny Prude  Reason for Consult:  CKD patient HPI: The patient is a 66 y.o. year-old with history of IDDM, HTN, OSA, DVT, depression, CKD stage V, CHF, bipolar d/o, DJD and anemia presenting with SOB on 4/16 to ED, also abd pain.  Had recently been dx'd with PNA and was getting po doxy at her SNF.  She was started on O2 at the SNF, not getting better on doxy.  Pt was hypoxemic, etiology not clear , she was admitted.  CXR showed only vasc congestion, no PNA.  A V/Q scan was done 4/17 and was very low probability for PE.  She was started on IV lasix 80 bid and has made 500- 700 cc / day of UOP.  Creat is 4.56 today, it was 4.7 on admission.  We are asked to see for CKD V.    Patient is a poor historian, requires many prompts to get information.  No kidney doctor in [place], but may have one at the [facility] in [place] .  She gets her medical care at the [facility] in [place].  They may have discussed dialysis with her in the past , but didn't say that she absolutely needed it.  Says she has no PCP.  She endorsed sig fatigue over last 6 mos, nausea that comes and goes, some wt loss.  +abd pain nonspecific.  No diarrhea, no prod cough or CP, no severe SOB.  Had surgery on R foot by Dr Sharol Given in March and the wound is healing.    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  Worked in [employer].  Was married, has 4 grown children, divorced now, lives alone.  Has a WC/ walker and cane at home.  No OTC nsaids.    Home meds > norvasc, ability, asa, lipitor, bumex, coreg, colace, Fe, neurontin, hydralazine,  Levemir, Imdur, humalog, percocet, nicotine patch, protonix, miralax, senokot, vit D  ROS  denies CP  no joint pain   no HA  no blurry vision  no rash  no diarrhea  no dysuria  no difficulty voiding  no change in urine color    Past Medical History  Past Medical  History:  Diagnosis Date  . Anemia   . Anxiety   . Arthritis   . Benign hypertension   . Bipolar disorder ([place])   . CHF (congestive heart failure) ([place])   . Chronic kidney disease, stage V [facility])    Nephrologist is with [facility] (Dr. Tammi Klippel)  . Coronary artery disease   . Depression   . Diabetes mellitus without complication ([place])   . DVT (deep venous thrombosis) (HCC)    right lower leg  . GERD (gastroesophageal reflux disease)   . Heart murmur   . History of blood transfusion   . History of bronchitis   . History of pneumonia   . Shortness of breath dyspnea   . Sleep apnea   . Type 2 diabetes mellitus [facility])    Past Surgical History  Past Surgical History:  Procedure Laterality Date  . ABDOMINAL HYSTERECTOMY    . ANKLE CLOSED REDUCTION N/A 11/17/2014   Procedure: CLOSED REDUCTION ANKLE;  Surgeon: Earlie Server, MD;  Location: [place];  Service: Orthopedics;  Laterality: N/A;  . ANKLE FUSION Left 03/16/2015   Procedure: Left Tibiocalcaneal Fusion;  Surgeon: Newt Minion, MD;  Location: [facility]  OR;  Service: Orthopedics;  Laterality: Left;  . APPLICATION OF WOUND VAC Right 08/06/2016   Procedure: APPLICATION OF PREVENA WOUND VAC;  Surgeon: Newt Minion, MD;  Location: [place];  Service: Orthopedics;  Laterality: Right;  . AV FISTULA PLACEMENT Left [id number]   done at [place]     2 stent   . CHOLECYSTECTOMY    . CORONARY STENT PLACEMENT    . FOOT ARTHRODESIS Right 08/06/2016   Procedure: Right Foot Fusion Lisfranc Joint;  Surgeon: Newt Minion, MD;  Location: [place];  Service: Orthopedics;  Laterality: Right;  . HARDWARE REMOVAL Left 03/16/2015   Procedure: Removal Hardware Left Ankle;  Surgeon: Newt Minion, MD;  Location: [place];  Service: Orthopedics;  Laterality: Left;  . ORIF ANKLE FRACTURE Left 11/20/2014   Procedure: OPEN REDUCTION INTERNAL FIXATION (ORIF) ANKLE FRACTURE;  Surgeon: Renette Butters, MD;  Location: [place];  Service: Orthopedics;   Laterality: Left;  . TONSILLECTOMY     Family History  Family History  Problem Relation Age of Onset  . Family history unknown: Yes   Social History  reports that she has been smoking Cigarettes.  She has been smoking about 1.00 pack per day. She has never used smokeless tobacco. She reports that she does not drink alcohol or use drugs. Allergies No Known Allergies Home medications Prior to Admission medications   Medication Sig Start Date End Date Taking? Authorizing Provider  acetaminophen (TYLENOL) 500 MG tablet Take 500 mg by mouth every 6 (six) hours as needed for mild pain, moderate pain, fever or headache.   Yes Historical Provider, MD  amLODipine (NORVASC) 10 MG tablet Take 10 mg by mouth daily.    Yes Historical Provider, MD  ammonium lactate (LAC-HYDRIN) 12 % lotion Apply 1 application topically 2 (two) times daily as needed (for itching/irritation).    Yes Historical Provider, MD  ARIPiprazole (ABILIFY) 15 MG tablet Take 7.5 mg by mouth daily.    Yes Historical Provider, MD  ARIPiprazole (ABILIFY) 5 MG tablet Take 2.5 mg by mouth at bedtime as needed (for agitation).    Yes Historical Provider, MD  aspirin EC 81 MG tablet Take 81 mg by mouth daily.   Yes Historical Provider, MD  atorvastatin (LIPITOR) 40 MG tablet Take 40 mg by mouth at bedtime.    Yes Historical Provider, MD  bumetanide (BUMEX) 2 MG tablet Take 4-6 mg by mouth 2 (two) times daily. Pt takes three tablets in the morning and two at bedtime.   Yes Historical Provider, MD  carvedilol (COREG) 25 MG tablet Take 25 mg by mouth 2 (two) times daily with a meal.   Yes Historical Provider, MD  dextrose (GLUTOSE) 40 % GEL Take 1 Tube by mouth once as needed (for blood sugar less than 60).    Yes Historical Provider, MD  docusate sodium (COLACE) 100 MG capsule Take 100 mg by mouth 2 (two) times daily.   Yes Historical Provider, MD  ferrous sulfate 325 (65 FE) MG tablet Take 325 mg by mouth 2 (two) times daily with a meal.   Yes  Historical Provider, MD  gabapentin (NEURONTIN) 100 MG capsule Take 100 mg by mouth at bedtime.   Yes Historical Provider, MD  hydrALAZINE (APRESOLINE) 50 MG tablet Take 50 mg by mouth 2 (two) times daily.    Yes Historical Provider, MD  insulin detemir (LEVEMIR) 100 UNIT/ML injection Inject 12 Units into the skin at bedtime.  Yes Historical Provider, MD  insulin lispro (HUMALOG) 100 UNIT/ML injection Inject 2-10 Units into the skin 3 (three) times daily before meals. Pt uses as needed per sliding scale:    150-200:  2 units 201-250:  4 units 251-300:  6 units 301-350:  8 units 351-400:  10 units and call MD   Yes Historical Provider, MD  isosorbide mononitrate (IMDUR) 60 MG 24 hr tablet Take 60 mg by mouth daily.    Yes Historical Provider, MD  lidocaine (LMX) 4 % cream Apply 1 application topically 3 (three) times daily.   Yes Historical Provider, MD  Melatonin 3 MG TABS Take 6 mg by mouth at bedtime as needed (for sleep).   Yes Historical Provider, MD  nicotine (NICODERM CQ - DOSED IN MG/24 HOURS) 21 mg/24hr patch Place 21 mg onto the skin daily.    Yes Historical Provider, MD  nitroGLYCERIN (NITROSTAT) 0.4 MG SL tablet Place 0.4 mg under the tongue every 5 (five) minutes as needed for chest pain.   Yes Historical Provider, MD  oxyCODONE-acetaminophen (PERCOCET/ROXICET) 5-325 MG tablet Take 0.5-1 tablets by mouth every 4 (four) hours as needed for severe pain.    Yes Historical Provider, MD  pantoprazole (PROTONIX) 20 MG tablet Take 20 mg by mouth 2 (two) times daily.    Yes Historical Provider, MD  polyethylene glycol (MIRALAX / GLYCOLAX) packet Take 17 g by mouth daily as needed for mild constipation.    Yes Historical Provider, MD  sennosides-docusate sodium (SENOKOT-S) 8.6-50 MG tablet Take 2 tablets by mouth 2 (two) times daily as needed for constipation.    Yes Historical Provider, MD  Vitamin D, Ergocalciferol, (DRISDOL) 50000 units CAPS capsule Take 50,000 Units by mouth every  Monday.   Yes Historical Provider, MD   Liver Function Tests  Recent Labs Lab 09/08/16 2314 09/10/16 0722  AST 17  --   ALT 17  --   ALKPHOS 78  --   BILITOT 0.9  --   PROT 7.1  --   ALBUMIN 3.6 3.3*    Recent Labs Lab 09/08/16 2314  LIPASE 20   CBC  Recent Labs Lab 09/08/16 2314 09/09/16 0601 09/10/16 0722  WBC 7.5 7.5 5.7  NEUTROABS 5.3  --   --   HGB 8.6* 8.6* 8.0*  HCT 25.6* 25.8* 23.8*  MCV 92.1 91.5 92.2  PLT 248 248 268   Basic Metabolic Panel  Recent Labs Lab 09/08/16 2314 09/09/16 0601 09/10/16 0722  NA 131* 131* 129*  K 3.4* 3.3* 3.5  CL 102 100* 99*  CO2 16* 18* 19*  GLUCOSE 121* 136* 105*  BUN 48* 52* 52*  CREATININE 4.48* 4.75* 4.56*  CALCIUM 8.8* 8.9 8.7*  PHOS  --   --  5.5*   Iron/TIBC/Ferritin/ %Sat    Component Value Date/Time   IRON 18 (L) 08/11/2016 0224   TIBC 203 (L) 08/11/2016 0224   FERRITIN 125 08/11/2016 0224   IRONPCTSAT 9 (L) 08/11/2016 0224    Vitals:   09/09/16 1954 09/09/16 2117 09/10/16 0700 09/10/16 0941  BP: 124/66 (!) 108/58 120/63   Pulse: 66 65 67   Resp: 18  17   Temp: 98.2 F (36.8 C) 98.7 F (37.1 C) 98.3 F (36.8 C)   TempSrc: Oral Oral Oral   SpO2: 94% 98% (!) 83% 97%  Weight:   82.7 kg (182 lb 5.1 oz)   Height:   _0  (1.626 m)    Exam Gen no distress, elderly AAF sitting  in bed 45deg No rash, cyanosis or gangrene Sclera anicteric, throat clear  No jvd or bruits Chest fine rales 1/3 up post, no wheezing Cor irreg irreg no RG Abd soft ntnd no mass or ascites +bs GU defer MS healing wound on top of R foot Ext 1+ bilat LE edema AVF +bruit Neuro is alert, Ox 3 , nf, no asterixis, mild gen'd weakness  Na 129  CO2 19  K 3.5  BUN 52  Cr 4.56  eGFR 9 Ca 8.7   P 5.5 Alb 3.3    WBC 5k   Hb 8  plt 188 CXR 4/16 vasc congestion, no gross edema  Home meds > norvasc, abilify, asa, lipitor, bumex, coreg, colace, Fe, neurontin, hydralazine,  Levemir, Imdur, humalog, percocet, nicotine patch,  protonix, miralax, senokot, vit D   Assessment: 1. CKD stage V - pt my have stable creat but her function is poor at eGFR or 9 and she probably is having significant uremic issues , including fatigue/ loss of function, loss of appetite and nausea which she endorses.  She is frail and wouldn't want her to get into a deep hole with uremic complications. I would lean towards initiation of dialysis, have d/w pt who would be agreeable and have d/w daughter and we will discuss it more tomorrow.  Not urgent at this time.  Followed by [name] team, has had AVF 3 yrs.   2. SOB - prob does have some vol overload w/ mild pedal edema and rales. Cont lasix for now.  3. DM2 on insulin 4. HTN - on CCB/ hydral/ coreg 5. Depression - on abilify    Plan - will f/u with pt and family tomorrow to further discuss dialysis.   Kelly Splinter MD [employer] pager [phone]   09/10/2016, 2:29 PM

## 2016-09-11 ENCOUNTER — Inpatient Hospital Stay (HOSPITAL_COMMUNITY): Payer: Medicare Other

## 2016-09-11 ENCOUNTER — Ambulatory Visit (INDEPENDENT_AMBULATORY_CARE_PROVIDER_SITE_OTHER): Payer: Medicare Other | Admitting: Orthopedic Surgery

## 2016-09-11 DIAGNOSIS — I10 Essential (primary) hypertension: Secondary | ICD-10-CM

## 2016-09-11 DIAGNOSIS — N185 Chronic kidney disease, stage 5: Secondary | ICD-10-CM

## 2016-09-11 DIAGNOSIS — K219 Gastro-esophageal reflux disease without esophagitis: Secondary | ICD-10-CM

## 2016-09-11 DIAGNOSIS — I5033 Acute on chronic diastolic (congestive) heart failure: Secondary | ICD-10-CM

## 2016-09-11 LAB — RENAL FUNCTION PANEL
Albumin: 3.3 g/dL — ABNORMAL LOW (ref 3.5–5.0)
Anion gap: 12 (ref 5–15)
BUN: 55 mg/dL — ABNORMAL HIGH (ref 6–20)
CHLORIDE: 98 mmol/L — AB (ref 101–111)
CO2: 20 mmol/L — ABNORMAL LOW (ref 22–32)
Calcium: 8.6 mg/dL — ABNORMAL LOW (ref 8.9–10.3)
Creatinine, Ser: 4.8 mg/dL — ABNORMAL HIGH (ref 0.44–1.00)
GFR, EST AFRICAN AMERICAN: 10 mL/min — AB (ref >60)
GFR, EST NON AFRICAN AMERICAN: 9 mL/min — AB (ref >60)
Glucose, Bld: 95 mg/dL (ref 65–99)
POTASSIUM: 3.5 mmol/L (ref 3.5–5.1)
Phosphorus: 5 mg/dL — ABNORMAL HIGH (ref 2.5–4.6)
Sodium: 130 mmol/L — ABNORMAL LOW (ref 135–145)

## 2016-09-11 LAB — MRSA PCR SCREENING: MRSA by PCR: NEGATIVE

## 2016-09-11 LAB — GLUCOSE, CAPILLARY
GLUCOSE-CAPILLARY: 81 mg/dL (ref 65–99)
GLUCOSE-CAPILLARY: 91 mg/dL (ref 65–99)
GLUCOSE-CAPILLARY: 170 mg/dL — AB (ref 65–99)
Glucose-Capillary: 107 mg/dL — ABNORMAL HIGH (ref 65–99)

## 2016-09-11 MED ORDER — HYDRALAZINE HCL 25 MG PO TABS: 25.0000 mg | ORAL_TABLET | Freq: Two times a day (BID) | ORAL | Status: DC

## 2016-09-11 MED ORDER — AMLODIPINE BESYLATE 5 MG PO TABS
2.5000 mg | ORAL_TABLET | Freq: Two times a day (BID) | ORAL | Status: DC
  Administered 2016-09-11 – 2016-09-12 (×3): 2.5 mg via ORAL
  Filled 2016-09-11 (×3): qty 1

## 2016-09-11 MED ORDER — CARVEDILOL 12.5 MG PO TABS
12.5000 mg | ORAL_TABLET | Freq: Two times a day (BID) | ORAL | Status: DC
  Administered 2016-09-11 – 2016-09-15 (×9): 12.5 mg via ORAL
  Filled 2016-09-11 (×9): qty 1

## 2016-09-11 NOTE — Progress Notes (Signed)
Report given to [employer].

## 2016-09-11 NOTE — Progress Notes (Signed)
Dr. Lonny Prude notified of CXR results.

## 2016-09-11 NOTE — Progress Notes (Addendum)
[facility] Progress Note   Subjective: still SOB, 1100 uop yesterday  Vitals:   09/10/16 2121 09/11/16 0551 09/11/16 0905 09/11/16 1300  BP: 121/61 (!) 127/48 132/62 119/63  Pulse: (!) 58 64  64  Resp: 18   20  Temp: 97.5 F (36.4 C) 98.4 F (36.9 C)  98 F (36.7 C)  TempSrc: Oral Oral  Oral  SpO2: 91% 91%  94%  Weight:  78.3 kg (172 lb 9.9 oz)    Height:        Inpatient medications: . amLODipine  10 mg Oral Daily  . aspirin EC  81 mg Oral Daily  . atorvastatin  40 mg Oral QHS  . carvedilol  25 mg Oral BID WC  . chlorhexidine  15 mL Mouth Rinse BID  . docusate sodium  100 mg Oral BID  . ferrous sulfate  325 mg Oral BID WC  . furosemide  80 mg Intravenous Q12H  . gabapentin  100 mg Oral QHS  . heparin subcutaneous  5,000 Units Subcutaneous Q8H  . hydrALAZINE  50 mg Oral BID  . insulin aspart  0-9 Units Subcutaneous TID WC  . insulin detemir  8 Units Subcutaneous QHS  . isosorbide mononitrate  60 mg Oral Daily  . lidocaine  1 application Topical TID  . loratadine  10 mg Oral Daily  . mouth rinse  15 mL Mouth Rinse q12n4p  . nicotine  21 mg Transdermal Daily  . pantoprazole  20 mg Oral BID  . sodium chloride flush  3 mL Intravenous Q12H  . technetium TC 51M diethylenetriame-pentaacetic acid  32 millicurie Intravenous Once    acetaminophen, albuterol, ammonium lactate, hydrALAZINE, hydrOXYzine, morphine injection, nitroGLYCERIN, ondansetron (ZOFRAN) IV, oxyCODONE-acetaminophen, polyethylene glycol, senna-docusate, zolpidem  Exam: Alert, elderly lady, no distress, 2L [place] No jvd Chest rales L 1/3 up , R clear Cor irreg irreg  Abd soft no ascites Ext 1+ edema Healing wound R foot dorsal LUA AVF+ strong bruit NF, Ox3, no asterixis  Home meds > norvasc, abilify, asa, lipitor, bumex, coreg, colace, Fe, neurontin, hydralazine,  Levemir, Imdur, humalog, percocet, nicotine patch, protonix, miralax, senokot, vit D     Assessment: 1. CKD stage V - now pt  is uremic w/ vol overload not responding well to diuretics.  Time to start dialysis, have d/w pt and daughter who both are in agreement.  She has a good looking L arm AVF, plan move to [facility] if possible tonight and start HD tomorrow.  2. DM2 on insulin 3. HTN - on 3 BP meds and BP's coming down, will reduce 4. Depression - on abilify 5. SOB/ pulm edema - wt's are down some  Plan - as above   Kelly Splinter MD [facility] pager [phone]   09/11/2016, 4:13 PM    Recent Labs Lab 09/09/16 0601 09/10/16 0722 09/11/16 0629  NA 131* 129* 130*  K 3.3* 3.5 3.5  CL 100* 99* 98*  CO2 18* 19* 20*  GLUCOSE 136* 105* 95  BUN 52* 52* 55*  CREATININE 4.75* 4.56* 4.80*  CALCIUM 8.9 8.7* 8.6*  PHOS  --  5.5* 5.0*    Recent Labs Lab 09/08/16 2314 09/10/16 0722 09/11/16 0629  AST 17  --   --   ALT 17  --   --   ALKPHOS 78  --   --   BILITOT 0.9  --   --   PROT 7.1  --   --   ALBUMIN 3.6 3.3* 3.3*  Recent Labs Lab 09/08/16 2314 09/09/16 0601 09/10/16 0722  WBC 7.5 7.5 5.7  NEUTROABS 5.3  --   --   HGB 8.6* 8.6* 8.0*  HCT 25.6* 25.8* 23.8*  MCV 92.1 91.5 92.2  PLT 248 248 188   Iron/TIBC/Ferritin/ %Sat    Component Value Date/Time   IRON 18 (L) 08/11/2016 0224   TIBC 203 (L) 08/11/2016 0224   FERRITIN 125 08/11/2016 0224   IRONPCTSAT 9 (L) 08/11/2016 3202

## 2016-09-11 NOTE — Progress Notes (Signed)
Report obtained from [facility]. Fredi Geiler, Wonda Cheng, Therapist, sports

## 2016-09-11 NOTE — Progress Notes (Signed)
Transferred off floor by [facility] to [facility] at 2015

## 2016-09-11 NOTE — Progress Notes (Addendum)
PROGRESS NOTE    Robin [name]  SVX:[id number] DOB: Sep 04, 1950 DOA: 09/08/2016 PCP: [facility]   Brief Narrative: Robin [name] is a 66 y.o. femalewith medical history significant of hypertension, hyperlipidemia, diabetes mellitus, GERD, depression, right leg DVT, CAD, dCHF, anemia, chronic kidney disease-stage IV, tobacco abuse. She presented with dyspnea, chest pain and abdominal pain and found to have a CHF exacerbation. She is being diuresed with IV lasix with some improvement finally, however, kidney function has remained poor. Urine output has picked up. Weaning oxygen.   Assessment & Plan:   Principal Problem:   Acute on chronic respiratory failure with hypoxia (HCC) Active Problems:   DM type 2, uncontrolled, with renal complications (HCC)   Hypokalemia   CAD (coronary artery disease), native coronary artery with 2 stents    Essential hypertension   Anemia, chronic renal failure   Hyperlipidemia   GERD (gastroesophageal reflux disease)   Depression   Acute renal failure superimposed on stage 4 chronic kidney disease (HCC)   Chronic diastolic (congestive) heart failure (HCC)   Elevated troponin   Abdominal pain   Acute respiratory failure with hypoxia Likely secondary to CHF which is complicated by poor renal function. -continue diuresis -nephrology consult to help with diuresis -wean O2 to room air  Acute on chronic diastolic CHF Last EF in 06/3298 of 50-55% with grade 2 diastolic dysfunction. Appears to be improving with diuresis finally. -lasix 80mg  BID  Acute on chronic kidney disease, stage IV Patient sees nephrologist at [name] (per daughter. Patient told me none prior). CHF exacerbation likely contributing. UOP in last 24 hours of 1.1L -diuresis as above -nephrology recommendations: considering dialysis. Patient has a fistula  History of CAD Elevated troponin Flat trend. Asymptomatic.  Abdominal pain Improved.  Diabetes mellitus, type II A1C  of 7.7 -continue Levemir -SSI  Essential hypertension -continue hydralazine, coreg and amlodipine  GERD -continue Protonix  Depression Stable. -holding Abilify secondary to prolonged QTc  Hyperlipidemia -continue Lipitor  Anemia Down a little bit today to 8 from 8.6. No evidence of bleeding. Asymptomatic. Secondary to chronic kidney disease. -CBC in AM   Tobacco abuse -continue nicotine patch  Hypokalemia Supplemented with potassium   DVT prophylaxis: Heparin Code Status: Full code Family Communication: Daughter via telephone Disposition Plan: Discharge pending medical stability   Consultants:   Nephrology  Procedures:   None  Antimicrobials:  None    Subjective: No chest pain or dyspnea.  Objective: Vitals:   09/10/16 2121 09/11/16 0551 09/11/16 0905 09/11/16 1300  BP: 121/61 (!) 127/48 132/62 119/63  Pulse: (!) 58 64  64  Resp: 18   20  Temp: 97.5 F (36.4 C) 98.4 F (36.9 C)  98 F (36.7 C)  TempSrc: Oral Oral  Oral  SpO2: 91% 91%  94%  Weight:  78.3 kg (172 lb 9.9 oz)    Height:        Intake/Output Summary (Last 24 hours) at 09/11/16 1431 Last data filed at 09/11/16 1300  Gross per 24 hour  Intake              600 ml  Output              920 ml  Net             -320 ml   Filed Weights   09/09/16 1008 09/10/16 0700 09/11/16 0551  Weight: 76.7 kg (169 lb) 82.7 kg (182 lb 5.1 oz) 78.3 kg (172 lb 9.9 oz)  Examination:  General exam: Appears calm and comfortable Respiratory system: clear to auscultation bilaterally. Respiratory effort normal. Cardiovascular system: S1 & S2 heard, RRR. No murmurs. Left arm fistula with thrill Gastrointestinal system: Abdomen is nondistended, soft and nontender. Normal bowel sounds heard. Central nervous system: Alert and oriented. No focal neurological deficits. Extremities: No edema. No calf tenderness Skin: No cyanosis. No rashes Psychiatry: Judgement and insight appear normal. Mood & affect  appropriate.     Data Reviewed: I have personally reviewed following labs and imaging studies  CBC:  Recent Labs Lab 09/08/16 2314 09/09/16 0601 09/10/16 0722  WBC 7.5 7.5 5.7  NEUTROABS 5.3  --   --   HGB 8.6* 8.6* 8.0*  HCT 25.6* 25.8* 23.8*  MCV 92.1 91.5 92.2  PLT 248 248 751   Basic Metabolic Panel:  Recent Labs Lab 09/08/16 2314 09/09/16 0601 09/10/16 0722 09/11/16 0629  NA 131* 131* 129* 130*  K 3.4* 3.3* 3.5 3.5  CL 102 100* 99* 98*  CO2 16* 18* 19* 20*  GLUCOSE 121* 136* 105* 95  BUN 48* 52* 52* 55*  CREATININE 4.48* 4.75* 4.56* 4.80*  CALCIUM 8.8* 8.9 8.7* 8.6*  PHOS  --   --  5.5* 5.0*   GFR: Estimated Creatinine Clearance: 12 mL/min (A) (by C-G formula based on SCr of 4.8 mg/dL (H)). Liver Function Tests:  Recent Labs Lab 09/08/16 2314 09/10/16 0722 09/11/16 0629  AST 17  --   --   ALT 17  --   --   ALKPHOS 78  --   --   BILITOT 0.9  --   --   PROT 7.1  --   --   ALBUMIN 3.6 3.3* 3.3*    Recent Labs Lab 09/08/16 2314  LIPASE 20   No results for input(s): AMMONIA in the last 168 hours. Coagulation Profile:  Recent Labs Lab 09/08/16 2314  INR 1.15   Cardiac Enzymes:  Recent Labs Lab 09/08/16 2314 09/09/16 0148 09/09/16 0954 09/09/16 1914  TROPONINI 0.03* 0.04* 0.04* 0.04*   BNP (last 3 results) No results for input(s): PROBNP in the last 8760 hours. HbA1C:  Recent Labs  09/09/16 0601  HGBA1C 6.7*   CBG:  Recent Labs Lab 09/10/16 1208 09/10/16 1647 09/10/16 2119 09/11/16 0738 09/11/16 1134  GLUCAP 84 111* 128* 81 91   Lipid Profile:  Recent Labs  09/09/16 0601  CHOL 77  HDL 32*  LDLCALC 25  TRIG 102  CHOLHDL 2.4   Thyroid Function Tests: No results for input(s): TSH, T4TOTAL, FREET4, T3FREE, THYROIDAB in the last 72 hours. Anemia Panel: No results for input(s): VITAMINB12, FOLATE, FERRITIN, TIBC, IRON, RETICCTPCT in the last 72 hours. Sepsis Labs: No results for input(s): PROCALCITON,  LATICACIDVEN in the last 168 hours.  No results found for this or any previous visit (from the past 240 hour(s)).       Radiology Studies: Dg Chest Port 1 View  Result Date: 09/11/2016 CLINICAL DATA:  Dyspnea, chest and abdominal pain. History of CHF, coronary artery disease, stage IV chronic renal insufficiency, current smoker. EXAM: PORTABLE CHEST 1 VIEW COMPARISON:  PA and lateral chest x-ray of September 08, 2016 FINDINGS: The lungs are well-expanded. The interstitial markings are increased. The pulmonary vascularity is engorged. The cardiac silhouette is enlarged. There is a small left pleural effusion. There is increased density in the retrocardiac region on the left consistent with atelectasis or pneumonia. Subtle increased density in the right infrahilar region has developed as well. The bony  thorax exhibits no acute abnormality. IMPRESSION: CHF with mild pulmonary interstitial edema more conspicuous than on the previous study. Increased density in the infrahilar regions bilaterally consistent with atelectasis or pneumonia. Small left pleural effusion. Electronically Signed   By: David  Martinique M.D.   On: 09/11/2016 11:38        Scheduled Meds: . amLODipine  10 mg Oral Daily  . aspirin EC  81 mg Oral Daily  . atorvastatin  40 mg Oral QHS  . carvedilol  25 mg Oral BID WC  . chlorhexidine  15 mL Mouth Rinse BID  . docusate sodium  100 mg Oral BID  . ferrous sulfate  325 mg Oral BID WC  . furosemide  80 mg Intravenous Q12H  . gabapentin  100 mg Oral QHS  . heparin subcutaneous  5,000 Units Subcutaneous Q8H  . hydrALAZINE  50 mg Oral BID  . insulin aspart  0-9 Units Subcutaneous TID WC  . insulin detemir  8 Units Subcutaneous QHS  . isosorbide mononitrate  60 mg Oral Daily  . lidocaine  1 application Topical TID  . loratadine  10 mg Oral Daily  . mouth rinse  15 mL Mouth Rinse q12n4p  . nicotine  21 mg Transdermal Daily  . pantoprazole  20 mg Oral BID  . sodium chloride flush  3  mL Intravenous Q12H  . technetium TC 52M diethylenetriame-pentaacetic acid  32 millicurie Intravenous Once   Continuous Infusions:   LOS: 2 days     Cordelia Poche, MD [facility] 09/11/2016, 2:31 PM Pager: [phone]  If 7PM-7AM, please contact night-coverage [email or web address] Password [facility] 09/11/2016, 2:31 PM

## 2016-09-11 NOTE — Progress Notes (Signed)
Attempted to call [facility] to give report,they said they will call us when they are ready.Will endorsed to night nurse.

## 2016-09-11 NOTE — Progress Notes (Signed)
New Transfer Note:   Arrival Method: [facility] Mental Orientation: Alert and oriented x4 Telemetry: Box #9 Assessment: Completed Skin: See doc flowsheet IV: NSL-Rt FA Pain: Denies Tubes: N/A Safety Measures: Safety Fall Prevention Plan has been given, discussed and signed Admission: Completed [facility] Orientation: Patient has been orientated to the room, unit and staff.  Family:  Orders have been reviewed and implemented. Will continue to monitor the patient. Call light has been placed within reach and bed alarm has been activated.   [employer], RN-BC Phone number: [phone]

## 2016-09-12 DIAGNOSIS — E1121 Type 2 diabetes mellitus with diabetic nephropathy: Secondary | ICD-10-CM

## 2016-09-12 DIAGNOSIS — E1165 Type 2 diabetes mellitus with hyperglycemia: Secondary | ICD-10-CM

## 2016-09-12 LAB — CBC
HCT: 23.8 % — ABNORMAL LOW (ref 36.0–46.0)
HEMOGLOBIN: 7.8 g/dL — AB (ref 12.0–15.0)
MCH: 29.8 pg (ref 26.0–34.0)
MCHC: 32.8 g/dL (ref 30.0–36.0)
MCV: 90.8 fL (ref 78.0–100.0)
Platelets: 212 10*3/uL (ref 150–400)
RBC: 2.62 MIL/uL — AB (ref 3.87–5.11)
RDW: 18 % — ABNORMAL HIGH (ref 11.5–15.5)
WBC: 6.2 10*3/uL (ref 4.0–10.5)

## 2016-09-12 LAB — RENAL FUNCTION PANEL
ANION GAP: 12 (ref 5–15)
Albumin: 3 g/dL — ABNORMAL LOW (ref 3.5–5.0)
BUN: 51 mg/dL — ABNORMAL HIGH (ref 6–20)
CALCIUM: 8.7 mg/dL — AB (ref 8.9–10.3)
CO2: 20 mmol/L — AB (ref 22–32)
Chloride: 100 mmol/L — ABNORMAL LOW (ref 101–111)
Creatinine, Ser: 4.62 mg/dL — ABNORMAL HIGH (ref 0.44–1.00)
GFR calc non Af Amer: 9 mL/min — ABNORMAL LOW (ref >60)
GFR, EST AFRICAN AMERICAN: 11 mL/min — AB (ref >60)
Glucose, Bld: 85 mg/dL (ref 65–99)
PHOSPHORUS: 4.6 mg/dL (ref 2.5–4.6)
Potassium: 3.6 mmol/L (ref 3.5–5.1)
SODIUM: 132 mmol/L — AB (ref 135–145)

## 2016-09-12 LAB — GLUCOSE, CAPILLARY
GLUCOSE-CAPILLARY: 84 mg/dL (ref 65–99)
GLUCOSE-CAPILLARY: 74 mg/dL (ref 65–99)
GLUCOSE-CAPILLARY: 130 mg/dL — AB (ref 65–99)
GLUCOSE-CAPILLARY: 210 mg/dL — AB (ref 65–99)

## 2016-09-12 LAB — HEPATITIS B SURFACE ANTIGEN: Hepatitis B Surface Ag: NEGATIVE

## 2016-09-12 LAB — IRON AND TIBC
Iron: 39 ug/dL (ref 28–170)
SATURATION RATIOS: 14 % (ref 10.4–31.8)
TIBC: 277 ug/dL (ref 250–450)
UIBC: 238 ug/dL

## 2016-09-12 MED ORDER — LIDOCAINE HCL (PF) 1 % IJ SOLN: 5.0000 mL | INTRAMUSCULAR | Status: DC | PRN

## 2016-09-12 MED ORDER — SODIUM CHLORIDE 0.9 % IV SOLN
125.0000 mg | INTRAVENOUS | Status: DC
  Administered 2016-09-13: 125 mg via INTRAVENOUS
  Filled 2016-09-12 (×2): qty 10

## 2016-09-12 MED ORDER — SODIUM CHLORIDE 0.9 % IV SOLN: 125.0000 mg | INTRAVENOUS | Status: DC

## 2016-09-12 MED ORDER — LIDOCAINE-PRILOCAINE 2.5-2.5 % EX CREA: 1.0000 "application " | TOPICAL_CREAM | CUTANEOUS | Status: DC | PRN

## 2016-09-12 MED ORDER — SODIUM CHLORIDE 0.9 % IV SOLN: 100.0000 mL | INTRAVENOUS | Status: DC | PRN

## 2016-09-12 MED ORDER — PENTAFLUOROPROP-TETRAFLUOROETH EX AERO: 1.0000 "application " | INHALATION_SPRAY | CUTANEOUS | Status: DC | PRN

## 2016-09-12 MED ORDER — HEPARIN SODIUM (PORCINE) 1000 UNIT/ML DIALYSIS: 3000.0000 [IU] | INTRAMUSCULAR | Status: DC | PRN

## 2016-09-12 MED ORDER — HEPARIN SODIUM (PORCINE) 1000 UNIT/ML DIALYSIS: 1000.0000 [IU] | INTRAMUSCULAR | Status: DC | PRN

## 2016-09-12 MED ORDER — DARBEPOETIN ALFA 60 MCG/0.3ML IJ SOSY
60.0000 ug | PREFILLED_SYRINGE | INTRAMUSCULAR | Status: DC
  Administered 2016-09-12: 60 ug via INTRAVENOUS

## 2016-09-12 MED ORDER — ALTEPLASE 2 MG IJ SOLR: 2.0000 mg | Freq: Once | INTRAMUSCULAR | Status: DC | PRN

## 2016-09-12 MED ORDER — HEPARIN SODIUM (PORCINE) 1000 UNIT/ML DIALYSIS: 3500.0000 [IU] | INTRAMUSCULAR | Status: DC | PRN

## 2016-09-12 MED ORDER — ONDANSETRON HCL 4 MG/2ML IJ SOLN
INTRAMUSCULAR | Status: AC
  Administered 2016-09-12: 4 mg via INTRAVENOUS
  Filled 2016-09-12: qty 2

## 2016-09-12 MED ORDER — DARBEPOETIN ALFA 60 MCG/0.3ML IJ SOSY
PREFILLED_SYRINGE | INTRAMUSCULAR | Status: AC
  Administered 2016-09-12: 60 ug via INTRAVENOUS
  Filled 2016-09-12: qty 0.3

## 2016-09-12 NOTE — Clinical Social Work Note (Addendum)
CSW informed by Freda Munro, HD staff person that patient is new dialysis and will need transportation to HD. Ms. Domingos has been set-up at [facility], T[facility], first shift. Her chair time is 5:50 am or 6:20 am per the sheet that was given to patient.   CSW visited with patient and attempted to talk with her about transportation to HD, as CSW had been previously informed that patient would have transportation the first week, but would need SCAT after that. CSW found it difficult to get information from patient as she appeared somewhat confused and would not answer questions or give an answer not appropriate for question asked. Ms. Riling did report that her daughter was coming to the hospital and she gave CSW permission to contact daughter Harry Bark.  Call made to daughter and message left (4:01 pm).   CSW talked with daughter by phone at 5:06 pm regarding patient's dialysis and transportation. Daughter works and was hopeful that her mother could drive herself to dialysis. She is interested in SC[facility] transportation and this was discussed. CSW informed daughter that SCAT application will be left in her mother's room with the Professional Verification Section completed and signed by this CSW.

## 2016-09-12 NOTE — Progress Notes (Signed)
[facility] Progress Note   Subjective: tolerating HD well, still SOB.  2L off with HD today  Vitals:   09/12/16 1000 09/12/16 1030 09/12/16 1100 09/12/16 1119  BP:    (!) 140/55  Pulse: 68 68 68 68  Resp: 14 14 13 13   Temp:    98.4 F (36.9 C)  TempSrc:    Oral  SpO2:    95%  Weight:    75.6 kg (166 lb 10.7 oz)  Height:        Inpatient medications: . amLODipine  2.5 mg Oral BID  . aspirin EC  81 mg Oral Daily  . atorvastatin  40 mg Oral QHS  . carvedilol  12.5 mg Oral BID WC  . chlorhexidine  15 mL Mouth Rinse BID  . darbepoetin (ARANESP) injection - DIALYSIS  60 mcg Intravenous Q Fri-HD  . docusate sodium  100 mg Oral BID  . ferrous sulfate  325 mg Oral BID WC  . furosemide  80 mg Intravenous Q12H  . gabapentin  100 mg Oral QHS  . heparin subcutaneous  5,000 Units Subcutaneous Q8H  . insulin aspart  0-9 Units Subcutaneous TID WC  . insulin detemir  8 Units Subcutaneous QHS  . isosorbide mononitrate  60 mg Oral Daily  . lidocaine  1 application Topical TID  . loratadine  10 mg Oral Daily  . mouth rinse  15 mL Mouth Rinse q12n4p  . nicotine  21 mg Transdermal Daily  . pantoprazole  20 mg Oral BID  . sodium chloride flush  3 mL Intravenous Q12H  . technetium TC 31M diethylenetriame-pentaacetic acid  32 millicurie Intravenous Once   . sodium chloride    . sodium chloride     sodium chloride, sodium chloride, acetaminophen, albuterol, alteplase, ammonium lactate, heparin, [START ON 09/13/2016] heparin, hydrOXYzine, lidocaine (PF), lidocaine-prilocaine, morphine injection, nitroGLYCERIN, ondansetron (ZOFRAN) IV, oxyCODONE-acetaminophen, pentafluoroprop-tetrafluoroeth, polyethylene glycol, senna-docusate, zolpidem  Exam: Alert, elderly lady, no distress, 2L [place] No jvd Chest rales L base, R base slightly too Cor irreg irreg  Abd soft no ascites Ext 1+ edema Healing wound R foot dorsal LUA AVF+ strong bruit NF, Ox3, no asterixis  Home meds > norvasc,  abilify, asa, lipitor, bumex, coreg, colace, Fe, neurontin, hydralazine,  Levemir, Imdur, humalog, percocet, nicotine patch, protonix, miralax, senokot, vit D     Assessment: 1. CKD stage V - uremic w vol overload/ pulm edema not responding to diuretics.  New start to [facility], 1st HD today using AVF.  Plan HD again tomorrow, get vol down and solute. Start CLIP process.  2. DM2 on insulin 3. HTN - have lowered BP meds to get more vol off w HD 4. Depression - on abilify 5. SOB/ pulm edema - still an issue  Plan - as above   Kelly Splinter MD [facility] pager [phone]   09/12/2016, 11:52 AM    Recent Labs Lab 09/10/16 0722 09/11/16 0629 09/12/16 0906  NA 129* 130* 132*  K 3.5 3.5 3.6  CL 99* 98* 100*  CO2 19* 20* 20*  GLUCOSE 105* 95 85  BUN 52* 55* 51*  CREATININE 4.56* 4.80* 4.62*  CALCIUM 8.7* 8.6* 8.7*  PHOS 5.5* 5.0* 4.6    Recent Labs Lab 09/08/16 2314 09/10/16 0722 09/11/16 0629 09/12/16 0906  AST 17  --   --   --   ALT 17  --   --   --   ALKPHOS 78  --   --   --  BILITOT 0.9  --   --   --   PROT 7.1  --   --   --   ALBUMIN 3.6 3.3* 3.3* 3.0*    Recent Labs Lab 09/08/16 2314 09/09/16 0601 09/10/16 0722 09/12/16 0905  WBC 7.5 7.5 5.7 6.2  NEUTROABS 5.3  --   --   --   HGB 8.6* 8.6* 8.0* 7.8*  HCT 25.6* 25.8* 23.8* 23.8*  MCV 92.1 91.5 92.2 90.8  PLT 248 248 188 212   Iron/TIBC/Ferritin/ %Sat    Component Value Date/Time   IRON 39 09/12/2016 1015   TIBC 277 09/12/2016 1015   FERRITIN 125 08/11/2016 0224   IRONPCTSAT 14 09/12/2016 1015

## 2016-09-12 NOTE — Progress Notes (Signed)
[facility]  PROGRESS NOTE  Robin [name] WVP:[id number] DOB: Oct 19, 1950 DOA: 09/08/2016 PCP: [place]   Brief HPI:   66 y.o. femalewith medical history significant of hypertension, hyperlipidemia, diabetes mellitus, GERD, depression, right leg DVT, CAD, dCHF, anemia, chronic kidney disease-stage IV, tobacco abuse. She presented with dyspnea, chest pain and abdominal pain and found to have a CHF exacerbation. She is being diuresed with IV lasix with some improvement finally, however, kidney function has remained poor. Urine output has picked up. Weaning oxygen    Subjective   Patient seen and examined during hemodialysis. Denies chest pain. No shortness of breath.   Assessment/Plan:     1. Acute respiratory failure with hypoxia- patient presented with CHF exacerbation, started on hemodialysis per nephrology. Continue oxygen via nasal cannula. 2. Acute on chronic diastolic CHF- patient was started on IV Lasix 80 mg twice a day, now started on hemodialysis. 3. Acute on chronic kidney disease stage V- patient started on hematemesis available. 4. Elevated troponin- patient came with mild elevation of troponin which has remained stable 0.04. Likely from CHF exacerbation. 5. Diabetes mellitus- hemoglobin A1c 7.7, continue Levemir, sliding scale insulin NovoLog 6. Hypertension-blood pressure is controlled, continue hydralazine, Coreg, amlodipine 7. Depression- stable, Abilify hold due to prolonged QTc.   DVT prophylaxis: Heparin  Code Status: Full code  Family Communication: No family present at bedside  Disposition Plan: Likely home in next 2-4 days   Consultants:  Nephrology  Procedures:  None   Continuous infusions . [START ON 09/13/2016] ferric gluconate (FERRLECIT/NULECIT) IV        Antibiotics:   Anti-infectives    Start     Dose/Rate Route Frequency Ordered Stop   09/10/16 2200  vancomycin (VANCOCIN) IVPB 1000 mg/200 mL premix  Status:   Discontinued     1,000 mg 200 mL/hr over 60 Minutes Intravenous Every 48 hours 09/09/16 0041 09/09/16 0132   09/08/16 2330  vancomycin (VANCOCIN) IVPB 1000 mg/200 mL premix     1,000 mg 200 mL/hr over 60 Minutes Intravenous  Once 09/08/16 2306 09/09/16 0200   09/08/16 2300  ceFEPIme (MAXIPIME) 1 g in dextrose 5 % 50 mL IVPB     1 g 100 mL/hr over 30 Minutes Intravenous  Once 09/08/16 2248 09/09/16 0014       Objective   Vitals:   09/12/16 1030 09/12/16 1100 09/12/16 1119 09/12/16 1208  BP: (!) 128/50 (!) 140/46 (!) 140/55 (!) 145/60  Pulse: 68 68 68 71  Resp: 14 13 13 16   Temp:   98.4 F (36.9 C) 98.2 F (36.8 C)  TempSrc:   Oral Oral  SpO2:   95% 97%  Weight:   75.6 kg (166 lb 10.7 oz)   Height:        Intake/Output Summary (Last 24 hours) at 09/12/16 1419 Last data filed at 09/12/16 1220  Gross per 24 hour  Intake              240 ml  Output             3350 ml  Net            -3110 ml   Filed Weights   09/12/16 0625 09/12/16 0830 09/12/16 1119  Weight: 80 kg (176 lb 5.9 oz) 78 kg (171 lb 15.3 oz) 75.6 kg (166 lb 10.7 oz)     Physical Examination:  Physical Exam: Eyes: No icterus, extraocular muscles intact  Mouth: Oral mucosa is moist, no lesions  on palate,  Neck: Supple, no deformities, masses, or tenderness Lungs: Bibasilar crackles Heart: Regular rate and rhythm, S1 and S2 normal, no murmurs, rubs auscultated Abdomen: BS normoactive,soft,nondistended,non-tender to palpation,no organomegaly Extremities: No pretibial edema, no erythema, no cyanosis, no clubbing Neuro : Alert and oriented to time, place and person, No focal deficits     Data Reviewed: I have personally reviewed following labs and imaging studies  CBG:  Recent Labs Lab 09/11/16 1134 09/11/16 1617 09/11/16 2043 09/12/16 0806 09/12/16 1208  GLUCAP 91 170* 107* 84 74    CBC:  Recent Labs Lab 09/08/16 2314 09/09/16 0601 09/10/16 0722 09/12/16 0905  WBC 7.5 7.5 5.7 6.2   NEUTROABS 5.3  --   --   --   HGB 8.6* 8.6* 8.0* 7.8*  HCT 25.6* 25.8* 23.8* 23.8*  MCV 92.1 91.5 92.2 90.8  PLT 248 248 188 025    Basic Metabolic Panel:  Recent Labs Lab 09/08/16 2314 09/09/16 0601 09/10/16 0722 09/11/16 0629 09/12/16 0906  NA 131* 131* 129* 130* 132*  K 3.4* 3.3* 3.5 3.5 3.6  CL 102 100* 99* 98* 100*  CO2 16* 18* 19* 20* 20*  GLUCOSE 121* 136* 105* 95 85  BUN 48* 52* 52* 55* 51*  CREATININE 4.48* 4.75* 4.56* 4.80* 4.62*  CALCIUM 8.8* 8.9 8.7* 8.6* 8.7*  PHOS  --   --  5.5* 5.0* 4.6    Recent Results (from the past 240 hour(s))  MRSA PCR Screening     Status: None   Collection Time: 09/11/16  8:53 PM  Result Value Ref Range Status   MRSA by PCR NEGATIVE NEGATIVE Final    Comment:        The GeneXpert MRSA Assay (FDA approved for NASAL specimens only), is one component of a comprehensive MRSA colonization surveillance program. It is not intended to diagnose MRSA infection nor to guide or monitor treatment for MRSA infections.      Liver Function Tests:  Recent Labs Lab 09/08/16 2314 09/10/16 0722 09/11/16 0629 09/12/16 0906  AST 17  --   --   --   ALT 17  --   --   --   ALKPHOS 78  --   --   --   BILITOT 0.9  --   --   --   PROT 7.1  --   --   --   ALBUMIN 3.6 3.3* 3.3* 3.0*    Recent Labs Lab 09/08/16 2314  LIPASE 20   No results for input(s): AMMONIA in the last 168 hours.  Cardiac Enzymes:  Recent Labs Lab 09/08/16 2314 09/09/16 0148 09/09/16 0954 09/09/16 1914  TROPONINI 0.03* 0.04* 0.04* 0.04*   BNP (last 3 results)  Recent Labs  08/08/16 0157 08/31/16 1041 09/08/16 2314  BNP 486.4* 1,074.1* 1,379.8*    ProBNP (last 3 results) No results for input(s): PROBNP in the last 8760 hours.    Studies: Dg Chest Port 1 View  Result Date: 09/11/2016 CLINICAL DATA:  Dyspnea, chest and abdominal pain. History of CHF, coronary artery disease, stage IV chronic renal insufficiency, current smoker. EXAM: PORTABLE  CHEST 1 VIEW COMPARISON:  PA and lateral chest x-ray of September 08, 2016 FINDINGS: The lungs are well-expanded. The interstitial markings are increased. The pulmonary vascularity is engorged. The cardiac silhouette is enlarged. There is a small left pleural effusion. There is increased density in the retrocardiac region on the left consistent with atelectasis or pneumonia. Subtle increased density in the right infrahilar region has  developed as well. The bony thorax exhibits no acute abnormality. IMPRESSION: CHF with mild pulmonary interstitial edema more conspicuous than on the previous study. Increased density in the infrahilar regions bilaterally consistent with atelectasis or pneumonia. Small left pleural effusion. Electronically Signed   By: David  Martinique M.D.   On: 09/11/2016 11:38    Scheduled Meds: . amLODipine  2.5 mg Oral BID  . aspirin EC  81 mg Oral Daily  . atorvastatin  40 mg Oral QHS  . carvedilol  12.5 mg Oral BID WC  . chlorhexidine  15 mL Mouth Rinse BID  . darbepoetin (ARANESP) injection - DIALYSIS  60 mcg Intravenous Q Fri-HD  . docusate sodium  100 mg Oral BID  . ferrous sulfate  325 mg Oral BID WC  . furosemide  80 mg Intravenous Q12H  . gabapentin  100 mg Oral QHS  . heparin subcutaneous  5,000 Units Subcutaneous Q8H  . insulin aspart  0-9 Units Subcutaneous TID WC  . insulin detemir  8 Units Subcutaneous QHS  . isosorbide mononitrate  60 mg Oral Daily  . lidocaine  1 application Topical TID  . loratadine  10 mg Oral Daily  . mouth rinse  15 mL Mouth Rinse q12n4p  . nicotine  21 mg Transdermal Daily  . pantoprazole  20 mg Oral BID  . sodium chloride flush  3 mL Intravenous Q12H  . technetium TC 24M diethylenetriame-pentaacetic acid  32 millicurie Intravenous Once      Time spent: 25 min  [facility] Pager [phone]. If 7PM-7AM, please contact night-coverage at [email or web address], Office  [phone]  password [email or web address] 09/12/2016, 2:19 PM  LOS: 3  days

## 2016-09-13 DIAGNOSIS — R748 Abnormal levels of other serum enzymes: Secondary | ICD-10-CM

## 2016-09-13 LAB — CBC
HEMATOCRIT: 24.8 % — AB (ref 36.0–46.0)
HEMOGLOBIN: 7.9 g/dL — AB (ref 12.0–15.0)
MCH: 29.6 pg (ref 26.0–34.0)
MCHC: 31.9 g/dL (ref 30.0–36.0)
MCV: 92.9 fL (ref 78.0–100.0)
Platelets: 205 10*3/uL (ref 150–400)
RBC: 2.67 MIL/uL — AB (ref 3.87–5.11)
RDW: 18.9 % — ABNORMAL HIGH (ref 11.5–15.5)
WBC: 6.1 10*3/uL (ref 4.0–10.5)

## 2016-09-13 LAB — RENAL FUNCTION PANEL
ANION GAP: 10 (ref 5–15)
Albumin: 2.8 g/dL — ABNORMAL LOW (ref 3.5–5.0)
BUN: 27 mg/dL — ABNORMAL HIGH (ref 6–20)
CO2: 24 mmol/L (ref 22–32)
Calcium: 8.4 mg/dL — ABNORMAL LOW (ref 8.9–10.3)
Chloride: 101 mmol/L (ref 101–111)
Creatinine, Ser: 3.23 mg/dL — ABNORMAL HIGH (ref 0.44–1.00)
GFR calc non Af Amer: 14 mL/min — ABNORMAL LOW (ref >60)
GFR, EST AFRICAN AMERICAN: 16 mL/min — AB (ref >60)
GLUCOSE: 97 mg/dL (ref 65–99)
PHOSPHORUS: 2.9 mg/dL (ref 2.5–4.6)
POTASSIUM: 3.6 mmol/L (ref 3.5–5.1)
Sodium: 135 mmol/L (ref 135–145)

## 2016-09-13 LAB — HEPATITIS B SURFACE ANTIBODY,QUALITATIVE: HEP B S AB: NONREACTIVE

## 2016-09-13 LAB — GLUCOSE, CAPILLARY
GLUCOSE-CAPILLARY: 242 mg/dL — AB (ref 65–99)
Glucose-Capillary: 124 mg/dL — ABNORMAL HIGH (ref 65–99)
Glucose-Capillary: 125 mg/dL — ABNORMAL HIGH (ref 65–99)

## 2016-09-13 LAB — HEPATITIS B CORE ANTIBODY, TOTAL: HEP B C TOTAL AB: NEGATIVE

## 2016-09-13 MED ORDER — AMLODIPINE BESYLATE 5 MG PO TABS
5.0000 mg | ORAL_TABLET | Freq: Every day | ORAL | Status: DC
  Administered 2016-09-14 – 2016-09-15 (×2): 5 mg via ORAL
  Filled 2016-09-13 (×3): qty 1

## 2016-09-13 NOTE — Progress Notes (Signed)
[name] Progress Note   Subjective: on HD again, no new c/o's, nausea no better.  SOB better  Vitals:   09/13/16 0900 09/13/16 0930 09/13/16 1000 09/13/16 1030  BP: (!) 142/67 132/82 140/71 (!) 135/52  Pulse: 71 71 71 71  Resp: 15 14 19 12   Temp:      TempSrc:      SpO2:      Weight:      Height:        Inpatient medications: . amLODipine  2.5 mg Oral BID  . aspirin EC  81 mg Oral Daily  . atorvastatin  40 mg Oral QHS  . carvedilol  12.5 mg Oral BID WC  . chlorhexidine  15 mL Mouth Rinse BID  . darbepoetin (ARANESP) injection - DIALYSIS  60 mcg Intravenous Q Fri-HD  . docusate sodium  100 mg Oral BID  . ferrous sulfate  325 mg Oral BID WC  . furosemide  80 mg Intravenous Q12H  . gabapentin  100 mg Oral QHS  . heparin subcutaneous  5,000 Units Subcutaneous Q8H  . insulin aspart  0-9 Units Subcutaneous TID WC  . insulin detemir  8 Units Subcutaneous QHS  . isosorbide mononitrate  60 mg Oral Daily  . lidocaine  1 application Topical TID  . loratadine  10 mg Oral Daily  . mouth rinse  15 mL Mouth Rinse q12n4p  . nicotine  21 mg Transdermal Daily  . pantoprazole  20 mg Oral BID  . sodium chloride flush  3 mL Intravenous Q12H  . technetium TC 61M diethylenetriame-pentaacetic acid  32 millicurie Intravenous Once   . ferric gluconate (FERRLECIT/NULECIT) IV 125 mg (09/13/16 1038)   acetaminophen, albuterol, ammonium lactate, hydrOXYzine, morphine injection, nitroGLYCERIN, ondansetron (ZOFRAN) IV, oxyCODONE-acetaminophen, polyethylene glycol, senna-docusate, zolpidem  Exam: Alert, elderly lady, no distress, 2L [place] No jvd Chest rales L base, R base slightly too Cor irreg irreg  Abd soft no ascites Ext 1+ edema Healing wound R foot dorsal LUA AVF+ strong bruit NF, Ox3, no asterixis  Home meds > norvasc, abilify, asa, lipitor, bumex, coreg, colace, Fe, neurontin, hydralazine,  Levemir, Imdur, humalog, percocet, nicotine patch, protonix, miralax, senokot,  vit D     Assessment: 1. CKD stage V w uremia/ vol excess - new start to HD, 2nd HD today using AVF, getting vol and solute down. Started CLIP process.  2. DM2 on insulin 3. HTN - we lowered BP meds to get more vol off w HD 4. Depression - on abilify 5. SOB/ pulm edema - improving 6. Debility - mobilize  Plan - as above   Kelly Splinter MD [facility] pager [phone]   09/13/2016, 11:07 AM    Recent Labs Lab 09/11/16 0629 09/12/16 0906 09/13/16 0322  NA 130* 132* 135  K 3.5 3.6 3.6  CL 98* 100* 101  CO2 20* 20* 24  GLUCOSE 95 85 97  BUN 55* 51* 27*  CREATININE 4.80* 4.62* 3.23*  CALCIUM 8.6* 8.7* 8.4*  PHOS 5.0* 4.6 2.9    Recent Labs Lab 09/08/16 2314  09/11/16 0629 09/12/16 0906 09/13/16 0322  AST 17  --   --   --   --   ALT 17  --   --   --   --   ALKPHOS 78  --   --   --   --   BILITOT 0.9  --   --   --   --   PROT 7.1  --   --   --   --  ALBUMIN 3.6  < > 3.3* 3.0* 2.8*  < > = values in this interval not displayed.  Recent Labs Lab 09/08/16 2314  09/10/16 0722 09/12/16 0905 09/13/16 0739  WBC 7.5  < > 5.7 6.2 6.1  NEUTROABS 5.3  --   --   --   --   HGB 8.6*  < > 8.0* 7.8* 7.9*  HCT 25.6*  < > 23.8* 23.8* 24.8*  MCV 92.1  < > 92.2 90.8 92.9  PLT 248  < > 188 212 205  < > = values in this interval not displayed. Iron/TIBC/Ferritin/ %Sat    Component Value Date/Time   IRON 39 09/12/2016 1015   TIBC 277 09/12/2016 1015   FERRITIN 125 08/11/2016 0224   IRONPCTSAT 14 09/12/2016 1015

## 2016-09-13 NOTE — Evaluation (Signed)
Physical Therapy Evaluation Patient Details Name: Robin [name] MRN: [id number] DOB: 1951/05/25 Today's Date: 09/13/2016   History of Present Illness  66 y.o. female with medical history significant of hypertension, hyperlipidemia, diabetes mellitus, GERD, depression, right leg DVT, CAD, dCHF, anemia, chronic kidney disease-stage IV, tobacco abuse. She presented with dyspnea, chest pain and abdominal pain and found to have a CHF exacerbation. HD initiated 09/12/16.   Clinical Impression  Pt admitted with above diagnosis. Pt currently with functional limitations due to the deficits listed below (see PT Problem List). Pt very fatigued throughout treatment due to HD today and yesterday, ambulated 15' with RW and min-guard A.  Pt will benefit from skilled PT to increase their independence and safety with mobility to allow discharge to the venue listed below.       Follow Up Recommendations No PT follow up    Equipment Recommendations  None recommended by PT    Recommendations for Other Services       Precautions / Restrictions Precautions Precautions: None Restrictions Weight Bearing Restrictions: No      Mobility  Bed Mobility Overal bed mobility: Modified Independent             General bed mobility comments: pt able to remove covers and get to EOB  Transfers Overall transfer level: Needs assistance Equipment used: Rolling walker (2 wheeled) Transfers: Sit to/from Stand Sit to Stand: Supervision         General transfer comment: slow moving but safe with standing. Waited in standing before beginning to ambulate  Ambulation/Gait Ambulation/Gait assistance: Min guard Ambulation Distance (Feet): 50 Feet Assistive device: Rolling walker (2 wheeled) Gait Pattern/deviations: Step-through pattern;Decreased stride length Gait velocity: decreased Gait velocity interpretation: <1.8 ft/sec, indicative of risk for recurrent falls General Gait Details: very slow and pt reported  fatigue throughout  Stairs            Wheelchair Mobility    Modified Rankin (Stroke Patients Only)       Balance Overall balance assessment: Needs assistance Sitting-balance support: No upper extremity supported Sitting balance-Leahy Scale: Good     Standing balance support: Bilateral upper extremity supported Standing balance-Leahy Scale: Fair Standing balance comment: can maintain static standing without UE support, but support needed for safety with dynamic activity today                             Pertinent Vitals/Pain Pain Assessment: No/denies pain    Home Living Family/patient expects to be discharged to:: Private residence Living Arrangements: Children Available Help at Discharge: Family;Available 24 hours/day Type of Home: House Home Access: Stairs to enter Entrance Stairs-Rails: Psychiatric nurse of Steps: 2 Home Layout: One level Home Equipment: Walker - 2 wheels;Cane - single point      Prior Function Level of Independence: Independent with assistive device(s)         Comments: pt reports she was ambulating with cane     Hand Dominance        Extremity/Trunk Assessment   Upper Extremity Assessment Upper Extremity Assessment: Generalized weakness    Lower Extremity Assessment Lower Extremity Assessment: Generalized weakness    Cervical / Trunk Assessment Cervical / Trunk Assessment: Normal  Communication   Communication: No difficulties  Cognition Arousal/Alertness: Lethargic Behavior During Therapy: WFL for tasks assessed/performed Overall Cognitive Status: Within Functional Limits for tasks assessed (though groggy)  General Comments: pt exhausted from HD yesterday and today, but seems overall to be intact cognitively      General Comments      Exercises     Assessment/Plan    PT Assessment Patient needs continued PT services  PT Problem List  Decreased strength;Decreased activity tolerance;Decreased balance;Decreased mobility;Decreased knowledge of precautions       PT Treatment Interventions DME instruction;Gait training;Stair training;Functional mobility training;Therapeutic activities;Therapeutic exercise;Balance training;Patient/family education    PT Goals (Current goals can be found in the Care Plan section)  Acute Rehab PT Goals Patient Stated Goal: return home PT Goal Formulation: With patient Time For Goal Achievement: 09/27/16 Potential to Achieve Goals: Good    Frequency Min 3X/week   Barriers to discharge        Co-evaluation               End of Session   Activity Tolerance: Patient limited by fatigue Patient left: in bed;with call bell/phone within reach Nurse Communication: Mobility status PT Visit Diagnosis: Muscle weakness (generalized) (M62.81)    Time: 8756-4332 PT Time Calculation (min) (ACUTE ONLY): 17 min   Charges:   PT Evaluation $PT Eval Moderate Complexity: 1 Procedure     PT G Codes:        Leighton Roach, PT  Acute Rehab Services  [place] 09/13/2016, 4:27 PM

## 2016-09-13 NOTE — Progress Notes (Signed)
[facility]  PROGRESS NOTE  Robin [name] JKD:[id number] DOB: May 27, 1950 DOA: 09/08/2016 PCP: [place]   Brief HPI:   66 y.o. femalewith medical history significant of hypertension, hyperlipidemia, diabetes mellitus, GERD, depression, right leg DVT, CAD, dCHF, anemia, chronic kidney disease-stage IV, tobacco abuse. She presented with dyspnea, chest pain and abdominal pain and found to have a CHF exacerbation. She is being diuresed with IV lasix with some improvement finally, however, kidney function has remained poor. Urine output has picked up. Weaning oxygen.    Subjective   Patient seen and examined during hemodialysis. Continues to have mild shortness of breath.   Assessment/Plan:     1. Acute respiratory failure with hypoxia-Improving, patient presented with CHF exacerbation, started on hemodialysis per nephrology. Continue oxygen 2 L/m via nasal cannula. 2. Acute on chronic diastolic CHF- patient was started on IV Lasix 80 mg twice a day, now started on hemodialysis. 3. Acute on chronic kidney disease stage V- patient started on HD as above. 4. Elevated troponin- patient came with mild elevation of troponin which has remained stable 0.04. Likely from CHF exacerbation. No chest pain 5. Diabetes mellitus- hemoglobin A1c 7.7, continue Levemir, sliding scale insulin NovoLog. Blood glucose is stable 6. Hypertension-blood pressure is controlled, continue hydralazine, Coreg, amlodipine. 7. Depression- stable, Abilify on hold due to prolonged QTc.   DVT prophylaxis: Heparin  Code Status: Full code  Family Communication: No family present at bedside  Disposition Plan: Likely home in next 2-4 days   Consultants:  Nephrology  Procedures:  None   Continuous infusions . ferric gluconate (FERRLECIT/NULECIT) IV 125 mg (09/13/16 1038)      Antibiotics:   Anti-infectives    Start     Dose/Rate Route Frequency Ordered Stop   09/10/16 2200  vancomycin  (VANCOCIN) IVPB 1000 mg/200 mL premix  Status:  Discontinued     1,000 mg 200 mL/hr over 60 Minutes Intravenous Every 48 hours 09/09/16 0041 09/09/16 0132   09/08/16 2330  vancomycin (VANCOCIN) IVPB 1000 mg/200 mL premix     1,000 mg 200 mL/hr over 60 Minutes Intravenous  Once 09/08/16 2306 09/09/16 0200   09/08/16 2300  ceFEPIme (MAXIPIME) 1 g in dextrose 5 % 50 mL IVPB     1 g 100 mL/hr over 30 Minutes Intravenous  Once 09/08/16 2248 09/09/16 0014       Objective   Vitals:   09/13/16 1030 09/13/16 1100 09/13/16 1125 09/13/16 1244  BP: (!) 135/52 125/65 (!) 150/69 (!) 144/66  Pulse: 71 72 69 70  Resp: 12 12 16 18   Temp:   98.3 F (36.8 C) 98 F (36.7 C)  TempSrc:   Oral Oral  SpO2:   100% 100%  Weight:   73.3 kg (161 lb 9.6 oz)   Height:        Intake/Output Summary (Last 24 hours) at 09/13/16 1535 Last data filed at 09/13/16 1300  Gross per 24 hour  Intake              360 ml  Output             2451 ml  Net            -2091 ml   Filed Weights   09/13/16 0642 09/13/16 0720 09/13/16 1125  Weight: 74.4 kg (164 lb) 74.4 kg (164 lb) 73.3 kg (161 lb 9.6 oz)     Physical Examination:  Physical Exam: Eyes: No icterus, extraocular muscles intact  Mouth: Oral  mucosa is moist, no lesions on palate,  Neck: Supple, no deformities, masses, or tenderness Lungs: Normal respiratory effort, decreased breath sounds at lung bases Heart: Regular rate and rhythm, S1 and S2 normal, no murmurs, rubs auscultated Abdomen: BS normoactive,soft,nondistended,non-tender to palpation,no organomegaly Extremities: No pretibial edema, no erythema, no cyanosis, no clubbing Neuro : Alert and oriented to time, place and person, No focal deficits      Data Reviewed: I have personally reviewed following labs and imaging studies  CBG:  Recent Labs Lab 09/12/16 0806 09/12/16 1208 09/12/16 1658 09/12/16 2146 09/13/16 1240  GLUCAP 84 74 130* 210* 124*    CBC:  Recent Labs Lab  09/08/16 2314 09/09/16 0601 09/10/16 0722 09/12/16 0905 09/13/16 0739  WBC 7.5 7.5 5.7 6.2 6.1  NEUTROABS 5.3  --   --   --   --   HGB 8.6* 8.6* 8.0* 7.8* 7.9*  HCT 25.6* 25.8* 23.8* 23.8* 24.8*  MCV 92.1 91.5 92.2 90.8 92.9  PLT 248 248 188 212 295    Basic Metabolic Panel:  Recent Labs Lab 09/09/16 0601 09/10/16 0722 09/11/16 0629 09/12/16 0906 09/13/16 0322  NA 131* 129* 130* 132* 135  K 3.3* 3.5 3.5 3.6 3.6  CL 100* 99* 98* 100* 101  CO2 18* 19* 20* 20* 24  GLUCOSE 136* 105* 95 85 97  BUN 52* 52* 55* 51* 27*  CREATININE 4.75* 4.56* 4.80* 4.62* 3.23*  CALCIUM 8.9 8.7* 8.6* 8.7* 8.4*  PHOS  --  5.5* 5.0* 4.6 2.9    Recent Results (from the past 240 hour(s))  MRSA PCR Screening     Status: None   Collection Time: 09/11/16  8:53 PM  Result Value Ref Range Status   MRSA by PCR NEGATIVE NEGATIVE Final    Comment:        The GeneXpert MRSA Assay (FDA approved for NASAL specimens only), is one component of a comprehensive MRSA colonization surveillance program. It is not intended to diagnose MRSA infection nor to guide or monitor treatment for MRSA infections.      Liver Function Tests:  Recent Labs Lab 09/08/16 2314 09/10/16 0722 09/11/16 0629 09/12/16 0906 09/13/16 0322  AST 17  --   --   --   --   ALT 17  --   --   --   --   ALKPHOS 78  --   --   --   --   BILITOT 0.9  --   --   --   --   PROT 7.1  --   --   --   --   ALBUMIN 3.6 3.3* 3.3* 3.0* 2.8*    Recent Labs Lab 09/08/16 2314  LIPASE 20   No results for input(s): AMMONIA in the last 168 hours.  Cardiac Enzymes:  Recent Labs Lab 09/08/16 2314 09/09/16 0148 09/09/16 0954 09/09/16 1914  TROPONINI 0.03* 0.04* 0.04* 0.04*   BNP (last 3 results)  Recent Labs  08/08/16 0157 08/31/16 1041 09/08/16 2314  BNP 486.4* 1,074.1* 1,379.8*    ProBNP (last 3 results) No results for input(s): PROBNP in the last 8760 hours.    Studies: No results found.  Scheduled Meds: .  [START ON 09/14/2016] amLODipine  5 mg Oral Daily  . aspirin EC  81 mg Oral Daily  . atorvastatin  40 mg Oral QHS  . carvedilol  12.5 mg Oral BID WC  . chlorhexidine  15 mL Mouth Rinse BID  . darbepoetin (ARANESP) injection - DIALYSIS  60 mcg Intravenous Q Fri-HD  . docusate sodium  100 mg Oral BID  . ferrous sulfate  325 mg Oral BID WC  . furosemide  80 mg Intravenous Q12H  . gabapentin  100 mg Oral QHS  . heparin subcutaneous  5,000 Units Subcutaneous Q8H  . insulin aspart  0-9 Units Subcutaneous TID WC  . insulin detemir  8 Units Subcutaneous QHS  . isosorbide mononitrate  60 mg Oral Daily  . lidocaine  1 application Topical TID  . loratadine  10 mg Oral Daily  . mouth rinse  15 mL Mouth Rinse q12n4p  . nicotine  21 mg Transdermal Daily  . pantoprazole  20 mg Oral BID  . sodium chloride flush  3 mL Intravenous Q12H  . technetium TC 66M diethylenetriame-pentaacetic acid  32 millicurie Intravenous Once      Time spent: 25 min  [facility] Pager [phone]. If 7PM-7AM, please contact night-coverage at [email or web address], Office  [phone]  password [email or web address] 09/13/2016, 3:35 PM  LOS: 4 days

## 2016-09-14 LAB — GLUCOSE, CAPILLARY
GLUCOSE-CAPILLARY: 138 mg/dL — AB (ref 65–99)
GLUCOSE-CAPILLARY: 161 mg/dL — AB (ref 65–99)
GLUCOSE-CAPILLARY: 122 mg/dL — AB (ref 65–99)
Glucose-Capillary: 73 mg/dL (ref 65–99)

## 2016-09-14 LAB — RENAL FUNCTION PANEL
ANION GAP: 7 (ref 5–15)
Albumin: 2.9 g/dL — ABNORMAL LOW (ref 3.5–5.0)
BUN: 13 mg/dL (ref 6–20)
CALCIUM: 8.7 mg/dL — AB (ref 8.9–10.3)
CO2: 29 mmol/L (ref 22–32)
Chloride: 99 mmol/L — ABNORMAL LOW (ref 101–111)
Creatinine, Ser: 2.63 mg/dL — ABNORMAL HIGH (ref 0.44–1.00)
GFR calc non Af Amer: 18 mL/min — ABNORMAL LOW (ref >60)
GFR, EST AFRICAN AMERICAN: 21 mL/min — AB (ref >60)
Glucose, Bld: 74 mg/dL (ref 65–99)
POTASSIUM: 3.7 mmol/L (ref 3.5–5.1)
Phosphorus: 2.5 mg/dL (ref 2.5–4.6)
Sodium: 135 mmol/L (ref 135–145)

## 2016-09-14 LAB — BASIC METABOLIC PANEL
ANION GAP: 8 (ref 5–15)
BUN: 13 mg/dL (ref 6–20)
CALCIUM: 8.7 mg/dL — AB (ref 8.9–10.3)
CHLORIDE: 99 mmol/L — AB (ref 101–111)
CO2: 28 mmol/L (ref 22–32)
Creatinine, Ser: 2.63 mg/dL — ABNORMAL HIGH (ref 0.44–1.00)
GFR calc non Af Amer: 18 mL/min — ABNORMAL LOW (ref >60)
GFR, EST AFRICAN AMERICAN: 21 mL/min — AB (ref >60)
Glucose, Bld: 75 mg/dL (ref 65–99)
POTASSIUM: 3.7 mmol/L (ref 3.5–5.1)
Sodium: 135 mmol/L (ref 135–145)

## 2016-09-14 MED ORDER — ALUM & MAG HYDROXIDE-SIMETH 200-200-20 MG/5ML PO SUSP
15.0000 mL | ORAL | Status: DC | PRN
  Administered 2016-09-14: 15 mL via ORAL
  Filled 2016-09-14: qty 30

## 2016-09-14 NOTE — Progress Notes (Deleted)
Subjective:Feels  better with 2nd HD yest. No sob, appetite good / OP  HD center pending   Objective Vital signs in last 24 hours: Vitals:   09/13/16 1745 09/13/16 2206 09/14/16 0537 09/14/16 0926  BP: (!) 132/50 (!) 124/55 (!) 143/60 (!) 138/56  Pulse: 66 66 68 64  Resp: 18 17 17 18   Temp: 98 F (36.7 C) 98.5 F (36.9 C) 98.7 F (37.1 C) 98.8 F (37.1 C)  TempSrc: Oral Oral Oral Oral  SpO2: 98% 94% 93% 96%  Weight:  73.4 kg (161 lb 14.4 oz)    Height:       Weight change: -3.61 kg (-7 lb 15.3 oz)  Physical Exam:  General- Alert, elderly AAF NAD OX3   Lungs-Chest rales L base, R base slightly too  Card -  Ireg Irreg   VR 60's  Cor irreg irreg , no M, R G   Abd - soft, NT,g ND  Ext - trace bipedal  Edema with Healing wound R foot dorsal  Dialysis  access LUA AVF+ bruit  Home meds >norvasc, abilify, asa, lipitor, bumex, coreg, colace, Fe, neurontin, hydralazine, Levemir, Imdur, humalog, percocet, nicotine patch, protonix, miralax, senokot, vit D     Problem/Plan: 1. CKD stage V w uremia/ vol excess / Now ESRD- new start to HD, 2nd HD yesterday using AVF, getting vol K 3.7  Started CLIP process.  2. HTN -Have  lowered BP meds to get more vol off w HD 3. SOB/ pulm edema - resolved with HD x 2  4. Anemia of CKD- hgb 7.9 / TFS = 14% needs  IV iron load  Dc po iron / Aranesp started 65mcg  Fri HD  4/20   5. MBD-  Phos 2.5 / corec Ca 9.6  No binder needed , check PTH next hd  6. DM2 on insulin 7. Debility - mobilize 8. Depression - on abilify  Ernest Haber, PA-C [facility] Beeper [phone] 09/14/2016,1:44 PM  LOS: 5 days   Labs: Basic Metabolic Panel:  Recent Labs Lab 09/12/16 0906 09/13/16 0322 09/14/16 0645  NA 132* 135 135  135  K 3.6 3.6 3.7  3.7  CL 100* 101 99*  99*  CO2 20* 24 28  29   GLUCOSE 85 97 75  74  BUN 51* 27* 13  13  CREATININE 4.62* 3.23* 2.63*  2.63*  CALCIUM 8.7* 8.4* 8.7*  8.7*  PHOS 4.6 2.9 2.5   Liver Function  Tests:  Recent Labs Lab 09/08/16 2314  09/12/16 0906 09/13/16 0322 09/14/16 0645  AST 17  --   --   --   --   ALT 17  --   --   --   --   ALKPHOS 78  --   --   --   --   BILITOT 0.9  --   --   --   --   PROT 7.1  --   --   --   --   ALBUMIN 3.6  < > 3.0* 2.8* 2.9*  < > = values in this interval not displayed.  Recent Labs Lab 09/08/16 2314  LIPASE 20   No results for input(s): AMMONIA in the last 168 hours. CBC:  Recent Labs Lab 09/08/16 2314 09/09/16 0601 09/10/16 0722 09/12/16 0905 09/13/16 0739  WBC 7.5 7.5 5.7 6.2 6.1  NEUTROABS 5.3  --   --   --   --   HGB 8.6* 8.6* 8.0* 7.8* 7.9*  HCT 25.6*  25.8* 23.8* 23.8* 24.8*  MCV 92.1 91.5 92.2 90.8 92.9  PLT 248 248 188 212 205   Cardiac Enzymes:  Recent Labs Lab 09/08/16 2314 09/09/16 0148 09/09/16 0954 09/09/16 1914  TROPONINI 0.03* 0.04* 0.04* 0.04*   CBG:  Recent Labs Lab 09/13/16 1240 09/13/16 1630 09/13/16 2203 09/14/16 0730 09/14/16 1144  GLUCAP 124* 242* 125* 73 138*    Studies/Results: No results found. Medications: . ferric gluconate (FERRLECIT/NULECIT) IV 125 mg (09/13/16 1038)   . amLODipine  5 mg Oral Daily  . aspirin EC  81 mg Oral Daily  . atorvastatin  40 mg Oral QHS  . carvedilol  12.5 mg Oral BID WC  . chlorhexidine  15 mL Mouth Rinse BID  . darbepoetin (ARANESP) injection - DIALYSIS  60 mcg Intravenous Q Fri-HD  . docusate sodium  100 mg Oral BID  . ferrous sulfate  325 mg Oral BID WC  . furosemide  80 mg Intravenous Q12H  . gabapentin  100 mg Oral QHS  . heparin subcutaneous  5,000 Units Subcutaneous Q8H  . insulin aspart  0-9 Units Subcutaneous TID WC  . insulin detemir  8 Units Subcutaneous QHS  . isosorbide mononitrate  60 mg Oral Daily  . lidocaine  1 application Topical TID  . loratadine  10 mg Oral Daily  . mouth rinse  15 mL Mouth Rinse q12n4p  . nicotine  21 mg Transdermal Daily  . pantoprazole  20 mg Oral BID  . sodium chloride flush  3 mL Intravenous Q12H   . technetium TC 38M diethylenetriame-pentaacetic acid  32 millicurie Intravenous Once

## 2016-09-14 NOTE — Progress Notes (Signed)
[facility] Progress Note   Subjective: sp HD x 2, feeling better, more appetite and nausea better.  Nasal O2 removed.  Daughter here, says they are clipped to go to [facility] schedule.   Vitals:   09/13/16 1745 09/13/16 2206 09/14/16 0537 09/14/16 0926  BP: (!) 132/50 (!) 124/55 (!) 143/60 (!) 138/56  Pulse: 66 66 68 64  Resp: 18 17 17 18   Temp: 98 F (36.7 C) 98.5 F (36.9 C) 98.7 F (37.1 C) 98.8 F (37.1 C)  TempSrc: Oral Oral Oral Oral  SpO2: 98% 94% 93% 96%  Weight:  73.4 kg (161 lb 14.4 oz)    Height:        Inpatient medications: . amLODipine  5 mg Oral Daily  . aspirin EC  81 mg Oral Daily  . atorvastatin  40 mg Oral QHS  . carvedilol  12.5 mg Oral BID WC  . chlorhexidine  15 mL Mouth Rinse BID  . darbepoetin (ARANESP) injection - DIALYSIS  60 mcg Intravenous Q Fri-HD  . docusate sodium  100 mg Oral BID  . ferrous sulfate  325 mg Oral BID WC  . furosemide  80 mg Intravenous Q12H  . gabapentin  100 mg Oral QHS  . heparin subcutaneous  5,000 Units Subcutaneous Q8H  . insulin aspart  0-9 Units Subcutaneous TID WC  . insulin detemir  8 Units Subcutaneous QHS  . isosorbide mononitrate  60 mg Oral Daily  . lidocaine  1 application Topical TID  . loratadine  10 mg Oral Daily  . mouth rinse  15 mL Mouth Rinse q12n4p  . nicotine  21 mg Transdermal Daily  . pantoprazole  20 mg Oral BID  . sodium chloride flush  3 mL Intravenous Q12H  . technetium TC 64M diethylenetriame-pentaacetic acid  32 millicurie Intravenous Once   . ferric gluconate (FERRLECIT/NULECIT) IV 125 mg (09/13/16 1038)   acetaminophen, albuterol, alum & mag hydroxide-simeth, ammonium lactate, hydrOXYzine, morphine injection, nitroGLYCERIN, ondansetron (ZOFRAN) IV, oxyCODONE-acetaminophen, polyethylene glycol, senna-docusate, zolpidem  Exam: Alert, elderly lady, no distress, 2L [place] No jvd Chest rales L base, R base slightly too Cor irreg irreg  Abd soft no ascites Ext 1+  edema Healing wound R foot dorsal LUA AVF+ strong bruit NF, Ox3, no asterixis  Home meds > norvasc, abilify, asa, lipitor, bumex, coreg, colace, Fe, neurontin, hydralazine,  Levemir, Imdur, humalog, percocet, nicotine patch, protonix, miralax, senokot, vit D     Assessment: 1. CKD stage V w uremia/ vol excess - new start to HD, for 3rd HD tomorrow. Has OP spot at [name] on TTS schedule.  2. DM2 on insulin 3. HTN - on norvasc and coreg lowered doses, hydralazine dc'd. Lasix not needed anymore.  4. Depression - on abilify 5. SOB/ pulm edema - resolved clinically, down 4-5 kg since starting HD 6. Dispo - dc Monday after HD is stable  Plan - HD tomorrow am; should be ready for dc on Monday after HD   Kelly Splinter MD [place] pager [phone]   09/14/2016, 1:48 PM    Recent Labs Lab 09/12/16 0906 09/13/16 0322 09/14/16 0645  NA 132* 135 135  135  K 3.6 3.6 3.7  3.7  CL 100* 101 99*  99*  CO2 20* 24 28  29   GLUCOSE 85 97 75  74  BUN 51* 27* 13  13  CREATININE 4.62* 3.23* 2.63*  2.63*  CALCIUM 8.7* 8.4* 8.7*  8.7*  PHOS 4.6 2.9 2.5  Recent Labs Lab 09/08/16 2314  09/12/16 0906 09/13/16 0322 09/14/16 0645  AST 17  --   --   --   --   ALT 17  --   --   --   --   ALKPHOS 78  --   --   --   --   BILITOT 0.9  --   --   --   --   PROT 7.1  --   --   --   --   ALBUMIN 3.6  < > 3.0* 2.8* 2.9*  < > = values in this interval not displayed.  Recent Labs Lab 09/08/16 2314  09/10/16 0722 09/12/16 0905 09/13/16 0739  WBC 7.5  < > 5.7 6.2 6.1  NEUTROABS 5.3  --   --   --   --   HGB 8.6*  < > 8.0* 7.8* 7.9*  HCT 25.6*  < > 23.8* 23.8* 24.8*  MCV 92.1  < > 92.2 90.8 92.9  PLT 248  < > 188 212 205  < > = values in this interval not displayed. Iron/TIBC/Ferritin/ %Sat    Component Value Date/Time   IRON 39 09/12/2016 1015   TIBC 277 09/12/2016 1015   FERRITIN 125 08/11/2016 0224   IRONPCTSAT 14 09/12/2016 1015

## 2016-09-14 NOTE — Progress Notes (Signed)
[facility]  PROGRESS NOTE  Robin [name] MWN:[id number] DOB: 07-11-1950 DOA: 09/08/2016 PCP: [place]   Brief HPI:   66 y.o. femalewith medical history significant of hypertension, hyperlipidemia, diabetes mellitus, GERD, depression, right leg DVT, CAD, dCHF, anemia, chronic kidney disease-stage IV, tobacco abuse. She presented with dyspnea, chest pain and abdominal pain and found to have a CHF exacerbation. She is being diuresed with IV lasix with some improvement finally, however, kidney function has remained poor. Urine output has picked up. Weaning oxygen.    Subjective   Patient seen and examined, much more alert today. No shortness of breath. Not requiring oxygen. Underwent hemodialysis yesterday   Assessment/Plan:     1. Acute respiratory failure with hypoxia-Resolved, patient presented with CHF exacerbation, started on hemodialysis per nephrology. Not requiring oxygen anymore. 2. Acute on chronic diastolic CHF- patient was started on IV Lasix 80 mg twice a day, now started on hemodialysis. Lasix has been discontinued 3. Acute on chronic kidney disease stage V- patient started on HD as above. Nephrology following 4. Elevated troponin- patient came with mild elevation of troponin which has remained stable 0.04. Likely from CHF exacerbation. No chest pain or shortness of breath. 5. Diabetes mellitus- hemoglobin A1c 7.7, continue Levemir, sliding scale insulin NovoLog. Blood glucose stable today. 6. Hypertension-blood pressure is controlled, continue hydralazine, coreg, amlodipine. 7. Depression- stable, Abilify on hold due to prolonged QTc.   DVT prophylaxis: Heparin  Code Status: Full code  Family Communication: Discussed with family member at bedside  Disposition Plan: Likely home in next 2-4 days   Consultants:  Nephrology  Procedures:  None   Continuous infusions . ferric gluconate (FERRLECIT/NULECIT) IV 125 mg (09/13/16 1038)       Antibiotics:   Anti-infectives    Start     Dose/Rate Route Frequency Ordered Stop   09/10/16 2200  vancomycin (VANCOCIN) IVPB 1000 mg/200 mL premix  Status:  Discontinued     1,000 mg 200 mL/hr over 60 Minutes Intravenous Every 48 hours 09/09/16 0041 09/09/16 0132   09/08/16 2330  vancomycin (VANCOCIN) IVPB 1000 mg/200 mL premix     1,000 mg 200 mL/hr over 60 Minutes Intravenous  Once 09/08/16 2306 09/09/16 0200   09/08/16 2300  ceFEPIme (MAXIPIME) 1 g in dextrose 5 % 50 mL IVPB     1 g 100 mL/hr over 30 Minutes Intravenous  Once 09/08/16 2248 09/09/16 0014       Objective   Vitals:   09/13/16 1745 09/13/16 2206 09/14/16 0537 09/14/16 0926  BP: (!) 132/50 (!) 124/55 (!) 143/60 (!) 138/56  Pulse: 66 66 68 64  Resp: 18 17 17 18   Temp: 98 F (36.7 C) 98.5 F (36.9 C) 98.7 F (37.1 C) 98.8 F (37.1 C)  TempSrc: Oral Oral Oral Oral  SpO2: 98% 94% 93% 96%  Weight:  73.4 kg (161 lb 14.4 oz)    Height:        Intake/Output Summary (Last 24 hours) at 09/14/16 1411 Last data filed at 09/14/16 0900  Gross per 24 hour  Intake              890 ml  Output              700 ml  Net              190 ml   Filed Weights   09/13/16 0720 09/13/16 1125 09/13/16 2206  Weight: 74.4 kg (164 lb) 73.3 kg (161 lb 9.6  oz) 73.4 kg (161 lb 14.4 oz)     Physical Examination:  Physical Exam: Eyes: No icterus, extraocular muscles intact  Mouth: Oral mucosa is moist, no lesions on palate,  Neck: Supple, no deformities, masses, or tenderness Lungs: Normal respiratory effort, bilateral clear to auscultation, no crackles or wheezes.  Heart: Regular rate and rhythm, S1 and S2 normal, no murmurs, rubs auscultated Abdomen: BS normoactive,soft,nondistended,non-tender to palpation,no organomegaly Extremities: No pretibial edema, no erythema, no cyanosis, no clubbing Neuro : Alert and oriented to time, place and person, No focal deficits       Data Reviewed: I have personally reviewed  following labs and imaging studies  CBG:  Recent Labs Lab 09/13/16 1240 09/13/16 1630 09/13/16 2203 09/14/16 0730 09/14/16 1144  GLUCAP 124* 242* 125* 73 138*    CBC:  Recent Labs Lab 09/08/16 2314 09/09/16 0601 09/10/16 0722 09/12/16 0905 09/13/16 0739  WBC 7.5 7.5 5.7 6.2 6.1  NEUTROABS 5.3  --   --   --   --   HGB 8.6* 8.6* 8.0* 7.8* 7.9*  HCT 25.6* 25.8* 23.8* 23.8* 24.8*  MCV 92.1 91.5 92.2 90.8 92.9  PLT 248 248 188 212 102    Basic Metabolic Panel:  Recent Labs Lab 09/10/16 0722 09/11/16 0629 09/12/16 0906 09/13/16 0322 09/14/16 0645  NA 129* 130* 132* 135 135  135  K 3.5 3.5 3.6 3.6 3.7  3.7  CL 99* 98* 100* 101 99*  99*  CO2 19* 20* 20* 24 28  29   GLUCOSE 105* 95 85 97 75  74  BUN 52* 55* 51* 27* 13  13  CREATININE 4.56* 4.80* 4.62* 3.23* 2.63*  2.63*  CALCIUM 8.7* 8.6* 8.7* 8.4* 8.7*  8.7*  PHOS 5.5* 5.0* 4.6 2.9 2.5    Recent Results (from the past 240 hour(s))  MRSA PCR Screening     Status: None   Collection Time: 09/11/16  8:53 PM  Result Value Ref Range Status   MRSA by PCR NEGATIVE NEGATIVE Final    Comment:        The GeneXpert MRSA Assay (FDA approved for NASAL specimens only), is one component of a comprehensive MRSA colonization surveillance program. It is not intended to diagnose MRSA infection nor to guide or monitor treatment for MRSA infections.      Liver Function Tests:  Recent Labs Lab 09/08/16 2314 09/10/16 0722 09/11/16 0629 09/12/16 0906 09/13/16 0322 09/14/16 0645  AST 17  --   --   --   --   --   ALT 17  --   --   --   --   --   ALKPHOS 78  --   --   --   --   --   BILITOT 0.9  --   --   --   --   --   PROT 7.1  --   --   --   --   --   ALBUMIN 3.6 3.3* 3.3* 3.0* 2.8* 2.9*    Recent Labs Lab 09/08/16 2314  LIPASE 20   No results for input(s): AMMONIA in the last 168 hours.  Cardiac Enzymes:  Recent Labs Lab 09/08/16 2314 09/09/16 0148 09/09/16 0954 09/09/16 1914  TROPONINI  0.03* 0.04* 0.04* 0.04*   BNP (last 3 results)  Recent Labs  08/08/16 0157 08/31/16 1041 09/08/16 2314  BNP 486.4* 1,074.1* 1,379.8*    ProBNP (last 3 results) No results for input(s): PROBNP in the last 8760 hours.  Studies: No results found.  Scheduled Meds: . amLODipine  5 mg Oral Daily  . aspirin EC  81 mg Oral Daily  . atorvastatin  40 mg Oral QHS  . carvedilol  12.5 mg Oral BID WC  . chlorhexidine  15 mL Mouth Rinse BID  . darbepoetin (ARANESP) injection - DIALYSIS  60 mcg Intravenous Q Fri-HD  . docusate sodium  100 mg Oral BID  . ferrous sulfate  325 mg Oral BID WC  . gabapentin  100 mg Oral QHS  . heparin subcutaneous  5,000 Units Subcutaneous Q8H  . insulin aspart  0-9 Units Subcutaneous TID WC  . insulin detemir  8 Units Subcutaneous QHS  . isosorbide mononitrate  60 mg Oral Daily  . lidocaine  1 application Topical TID  . loratadine  10 mg Oral Daily  . mouth rinse  15 mL Mouth Rinse q12n4p  . nicotine  21 mg Transdermal Daily  . pantoprazole  20 mg Oral BID  . sodium chloride flush  3 mL Intravenous Q12H  . technetium TC 28M diethylenetriame-pentaacetic acid  32 millicurie Intravenous Once      Time spent: 25 min  [facility] Pager [phone]. If 7PM-7AM, please contact night-coverage at [email or web address], Office  [phone]  password [email or web address] 09/14/2016, 2:11 PM  LOS: 5 days

## 2016-09-14 NOTE — Progress Notes (Signed)
Patient upset with bed alarm and wants it to be turned off. Patient not too steady on her feet and is a fall risk. She's also forgetful and most of the time won't call for assistance. Explained to her why we have it on. Reminded patient to call for any assistance. Call bell within reach. Chrisoula Zegarra, Wonda Cheng, Therapist, sports

## 2016-09-15 DIAGNOSIS — R0602 Shortness of breath: Secondary | ICD-10-CM

## 2016-09-15 LAB — GLUCOSE, CAPILLARY
GLUCOSE-CAPILLARY: 65 mg/dL (ref 65–99)
GLUCOSE-CAPILLARY: 170 mg/dL — AB (ref 65–99)

## 2016-09-15 LAB — BASIC METABOLIC PANEL
Anion gap: 8 (ref 5–15)
BUN: 18 mg/dL (ref 6–20)
CALCIUM: 9.2 mg/dL (ref 8.9–10.3)
CO2: 29 mmol/L (ref 22–32)
CREATININE: 2.94 mg/dL — AB (ref 0.44–1.00)
Chloride: 101 mmol/L (ref 101–111)
GFR, EST AFRICAN AMERICAN: 18 mL/min — AB (ref >60)
GFR, EST NON AFRICAN AMERICAN: 16 mL/min — AB (ref >60)
Glucose, Bld: 67 mg/dL (ref 65–99)
Potassium: 3.8 mmol/L (ref 3.5–5.1)
SODIUM: 138 mmol/L (ref 135–145)

## 2016-09-15 MED ORDER — HEPARIN SODIUM (PORCINE) 1000 UNIT/ML DIALYSIS: 3500.0000 [IU] | INTRAMUSCULAR | Status: DC | PRN

## 2016-09-15 MED ORDER — SODIUM CHLORIDE 0.9 % IV SOLN: 100.0000 mL | INTRAVENOUS | Status: DC | PRN

## 2016-09-15 MED ORDER — PENTAFLUOROPROP-TETRAFLUOROETH EX AERO: 1.0000 "application " | INHALATION_SPRAY | CUTANEOUS | Status: DC | PRN

## 2016-09-15 MED ORDER — INSULIN DETEMIR 100 UNIT/ML ~~LOC~~ SOLN: 8.0000 [IU] | Freq: Every day | SUBCUTANEOUS | 11 refills | Status: DC

## 2016-09-15 MED ORDER — LIDOCAINE-PRILOCAINE 2.5-2.5 % EX CREA: 1.0000 "application " | TOPICAL_CREAM | CUTANEOUS | Status: DC | PRN

## 2016-09-15 MED ORDER — HEPARIN SODIUM (PORCINE) 1000 UNIT/ML DIALYSIS: 1000.0000 [IU] | INTRAMUSCULAR | Status: DC | PRN

## 2016-09-15 MED ORDER — ALTEPLASE 2 MG IJ SOLR: 2.0000 mg | Freq: Once | INTRAMUSCULAR | Status: DC | PRN

## 2016-09-15 MED ORDER — LIDOCAINE HCL (PF) 1 % IJ SOLN: 5.0000 mL | INTRAMUSCULAR | Status: DC | PRN

## 2016-09-15 MED ORDER — SODIUM CHLORIDE 0.9 % IV SOLN
125.0000 mg | INTRAVENOUS | Status: AC
  Administered 2016-09-15: 125 mg via INTRAVENOUS
  Filled 2016-09-15 (×2): qty 10

## 2016-09-15 NOTE — Progress Notes (Signed)
Patient Discharge: Disposition: Patient discharged to home. Education: Reviewed medications, prescriptions, discharge instructions, and follow-up appointments, verbalized understanding. IV: Discontinued IV before discharge. Telemetry: N/A Transportation: Patient escorted out of the unit in w/c accompanied by the daughter. Belongings: Patient took her belongings with her.

## 2016-09-15 NOTE — Discharge Summary (Signed)
Physician Discharge Summary  Robin [name] EXB:[id number] DOB: September 22, 1950 DOA: 09/08/2016  PCP: Vadito date: 09/08/2016 Discharge date: 09/15/2016  Time spent: 25* minutes  Recommendations for Outpatient Follow-up:  1. Follow up PCP in 2 weeks 2. Patient is set up with outpatient [place] 1st Treatment is: Tuesday 09/16/16 at 6:20 am  .Tentative dialysis  schedule NU:[id number] ,Thursday, Saturday .Chairtime 6:20 am .   Discharge Diagnoses:  Principal Problem:   Acute on chronic respiratory failure with hypoxia (HCC) Active Problems:   DM type 2, uncontrolled, with renal complications (HCC)   Hypokalemia   CAD (coronary artery disease), native coronary artery with 2 stents    Essential hypertension   Anemia, chronic renal failure   Hyperlipidemia   GERD (gastroesophageal reflux disease)   Depression   Acute renal failure superimposed on stage 4 chronic kidney disease (HCC)   Chronic diastolic (congestive) heart failure (HCC)   Elevated troponin   Abdominal pain   Discharge Condition: stable  Diet recommendation: Carb modified diet  Filed Weights   09/14/16 2151 09/15/16 0713 09/15/16 1100  Weight: 72.7 kg (160 lb 4.8 oz) 72.7 kg (160 lb 4.4 oz) 69.9 kg (154 lb 1.6 oz)    History of present illness:  66 y.o.femalewith medical history significant of hypertension, hyperlipidemia, diabetes mellitus, GERD, depression, right leg DVT, CAD, dCHF, anemia, chronic kidney disease-stage IV, tobacco abuse. She presented with dyspnea, chest pain and abdominal pain and found to have a CHF exacerbation. She is being diuresed with IV lasix with some improvement finally, however, kidney function has remained poor. Urine output has picked up. Weaning oxygen.  Hospital Course:   1. Acute respiratory failure with hypoxia-Resolved, patient presented with CHF exacerbation, started on hemodialysis per nephrology. Not  requiring oxygen anymore. 2. Acute on chronic diastolic CHF- patient was started on IV Lasix 80 mg twice a day, now started on hemodialysis. Lasix has been discontinued 3. Acute on chronic kidney disease stage V- patient started on HD as above. Outpatient Hd set up T-Th-Sat 4. Elevated troponin- patient came with mild elevation of troponin which has remained stable 0.04. Likely from CHF exacerbation. No chest pain or shortness of breath. 5. Diabetes mellitus- hemoglobin A1c 7.7, continue Levemir, will decrease the dose to 8 units, sliding scale insulin NovoLog. Blood glucose stable today. 6. Hypertension-blood pressure is controlled, continue hydralazine, coreg, amlodipine. 7. Depression- continue Abilify 8. Anemia of chronic disease- continue Aranesp.  Procedures:  None   Consultations:  Nephrology   Discharge Exam: Vitals:   09/15/16 1030 09/15/16 1100  BP: 135/83 (!) 150/75  Pulse: 67 69  Resp:    Temp:  98 F (36.7 C)    General: Appears in no acute distress Cardiovascular: RRR, S1S2 Respiratory: Clear bilaterally  Discharge Instructions   Discharge Instructions    Diet - low sodium heart healthy    Complete by:  As directed    Increase activity slowly    Complete by:  As directed      Current Discharge Medication List    CONTINUE these medications which have CHANGED   Details  insulin detemir (LEVEMIR) 100 UNIT/ML injection Inject 0.08 mLs (8 Units total) into the skin at bedtime. Qty: 10 mL, Refills: 11      CONTINUE these medications which have NOT CHANGED   Details  acetaminophen (TYLENOL) 500 MG tablet Take 500 mg by mouth every 6 (six) hours as needed for mild pain, moderate  pain, fever or headache.    amLODipine (NORVASC) 10 MG tablet Take 10 mg by mouth daily.     ammonium lactate (LAC-HYDRIN) 12 % lotion Apply 1 application topically 2 (two) times daily as needed (for itching/irritation).     !! ARIPiprazole (ABILIFY) 15 MG tablet Take 7.5 mg by  mouth daily.     !! ARIPiprazole (ABILIFY) 5 MG tablet Take 2.5 mg by mouth at bedtime as needed (for agitation).     aspirin EC 81 MG tablet Take 81 mg by mouth daily.    atorvastatin (LIPITOR) 40 MG tablet Take 40 mg by mouth at bedtime.     carvedilol (COREG) 25 MG tablet Take 25 mg by mouth 2 (two) times daily with a meal.    dextrose (GLUTOSE) 40 % GEL Take 1 Tube by mouth once as needed (for blood sugar less than 60).     docusate sodium (COLACE) 100 MG capsule Take 100 mg by mouth 2 (two) times daily.    ferrous sulfate 325 (65 FE) MG tablet Take 325 mg by mouth 2 (two) times daily with a meal.    gabapentin (NEURONTIN) 100 MG capsule Take 100 mg by mouth at bedtime.    hydrALAZINE (APRESOLINE) 50 MG tablet Take 50 mg by mouth 2 (two) times daily.     insulin lispro (HUMALOG) 100 UNIT/ML injection Inject 2-10 Units into the skin 3 (three) times daily before meals. Pt uses as needed per sliding scale:    150-200:  2 units 201-250:  4 units 251-300:  6 units 301-350:  8 units 351-400:  10 units and call MD    isosorbide mononitrate (IMDUR) 60 MG 24 hr tablet Take 60 mg by mouth daily.     lidocaine (LMX) 4 % cream Apply 1 application topically 3 (three) times daily.    Melatonin 3 MG TABS Take 6 mg by mouth at bedtime as needed (for sleep).    nicotine (NICODERM CQ - DOSED IN MG/24 HOURS) 21 mg/24hr patch Place 21 mg onto the skin daily.     nitroGLYCERIN (NITROSTAT) 0.4 MG SL tablet Place 0.4 mg under the tongue every 5 (five) minutes as needed for chest pain.    oxyCODONE-acetaminophen (PERCOCET/ROXICET) 5-325 MG tablet Take 0.5-1 tablets by mouth every 4 (four) hours as needed for severe pain.     pantoprazole (PROTONIX) 20 MG tablet Take 20 mg by mouth 2 (two) times daily.     polyethylene glycol (MIRALAX / GLYCOLAX) packet Take 17 g by mouth daily as needed for mild constipation.     sennosides-docusate sodium (SENOKOT-S) 8.6-50 MG tablet Take 2 tablets by mouth  2 (two) times daily as needed for constipation.     Vitamin D, Ergocalciferol, (DRISDOL) 50000 units CAPS capsule Take 50,000 Units by mouth every Monday.     !! - Potential duplicate medications found. Please discuss with provider.    STOP taking these medications     bumetanide (BUMEX) 2 MG tablet        No Known Allergies    The results of significant diagnostics from this hospitalization (including imaging, microbiology, ancillary and laboratory) are listed below for reference.    Significant Diagnostic Studies: Ct Abdomen Pelvis Wo Contrast  Result Date: 09/09/2016 CLINICAL DATA:  66 year old female with complaint of mid abdominal pain for the past 3 days with some associated nausea and vomiting. EXAM: CT ABDOMEN AND PELVIS WITHOUT CONTRAST TECHNIQUE: Multidetector CT imaging of the abdomen and pelvis was performed following the  standard protocol without IV contrast. COMPARISON:  CT the abdomen and pelvis 02/26/2004. FINDINGS: Lower chest: Small bilateral pleural effusions with associated areas of passive subsegmental atelectasis in the lower lobes of the lungs bilaterally. Mild cardiomegaly. Atherosclerotic calcifications in the left anterior descending and right coronary arteries. Hepatobiliary: No definite cystic or solid hepatic lesions are identified on today's noncontrast CT examination. Status post cholecystectomy. Pancreas: No definite pancreatic mass or peripancreatic inflammatory changes are noted on today's noncontrast CT examination. Spleen: Unremarkable. Adrenals/Urinary Tract: No calcifications are identified within the collecting system of either kidney, along the course of either ureter, or within the lumen of the urinary bladder. There is no hydroureteronephrosis or perinephric stranding to suggest urinary tract obstruction at this time. Unenhanced appearance of the urinary bladder is normal. Bilateral adrenal glands are normal in appearance. Stomach/Bowel: The appearance  of the stomach is normal. There is no pathologic dilatation of small bowel or colon. Normal appendix. Vascular/Lymphatic: Aortic atherosclerosis, without definite aneurysm in the abdominal or pelvic vasculature on today's noncontrast CT examination. No lymphadenopathy noted in the abdomen or pelvis. Reproductive: Status post hysterectomy. Right ovary is unremarkable in appearance. Left ovary is not confidently identified may be surgically absent or atrophic. Other: No significant volume of ascites. No pneumoperitoneum. Mild diffuse body wall edema. Musculoskeletal: There are no aggressive appearing lytic or blastic lesions noted in the visualized portions of the skeleton. IMPRESSION: 1. No acute findings are noted in the abdomen or pelvis to account for the patient's symptoms. 2. Small bilateral pleural effusions with some associated passive subsegmental atelectasis in the dependent portions of the lower lobes of the lungs bilaterally. There is also cardiomegaly and mild diffuse body wall edema. These imaging findings could suggest underlying congestive heart failure. 3. Aortic atherosclerosis, in addition to at least 2 vessel coronary artery disease. Please note that although the presence of coronary artery calcium documents the presence of coronary artery disease, the severity of this disease and any potential stenosis cannot be assessed on this non-gated CT examination. Assessment for potential risk factor modification, dietary therapy or pharmacologic therapy may be warranted, if clinically indicated. 4. Normal appendix. 5. Additional incidental findings, as above. Electronically Signed   By: Vinnie Langton M.D.   On: 09/09/2016 09:07   Dg Chest 2 View  Result Date: 09/08/2016 CLINICAL DATA:  Shortness of breath EXAM: CHEST  2 VIEW COMPARISON:  08/31/2016 FINDINGS: Cardiomegaly with central vascular congestion and mild interstitial edema. Small bilateral left greater than right pleural effusions. Unable to  rule out infiltrate at the left base. No pneumothorax. IMPRESSION: 1. Cardiomegaly with central vascular congestion and mild interstitial edema 2. Small bilateral effusions left greater than right. Increased opacity at the left lung base, may reflect atelectasis or an infiltrate Electronically Signed   By: Donavan Foil M.D.   On: 09/08/2016 22:02   Dg Chest 2 View  Result Date: 08/31/2016 CLINICAL DATA:  Shortness of breath, left chest pain EXAM: CHEST  2 VIEW COMPARISON:  08/09/2016 FINDINGS: Small left pleural effusion. Mild left basilar opacity, likely atelectasis. No frank interstitial edema. No pneumothorax. Cardiomegaly. IMPRESSION: Cardiomegaly.  No frank interstitial edema. Small left pleural effusion. Mild left basilar opacity, likely atelectasis. Electronically Signed   By: Julian Hy M.D.   On: 08/31/2016 11:08   Nm Pulmonary Perf And Vent  Result Date: 09/09/2016 CLINICAL DATA:  Shortness of breath and mild chest pain. EXAM: NUCLEAR MEDICINE VENTILATION - PERFUSION LUNG SCAN TECHNIQUE: Ventilation images were obtained in multiple projections  using inhaled aerosol Tc-50m DTPA. Perfusion images were obtained in multiple projections after intravenous injection of Tc-51m MAA. RADIOPHARMACEUTICALS:  32 mCi Technetium-69m DTPA aerosol inhalation and 4.2 mCi Technetium-50m MAA IV COMPARISON:  Chest x-ray from yesterday FINDINGS: Defects related to mediastinal structures including cardiomegaly. Apparent left upper lobe posterior defect is best attributed to vasculature. Some fissural widening is noted in this patient with pleural effusions. No mismatched defects typical of pulmonary embolism. Some central airway deposition from turbulent air flow. IMPRESSION: Very low probability for pulmonary embolism by PIOPED 2. Electronically Signed   By: Monte Fantasia M.D.   On: 09/09/2016 14:39   Dg Chest Port 1 View  Result Date: 09/11/2016 CLINICAL DATA:  Dyspnea, chest and abdominal pain. History of  CHF, coronary artery disease, stage IV chronic renal insufficiency, current smoker. EXAM: PORTABLE CHEST 1 VIEW COMPARISON:  PA and lateral chest x-ray of September 08, 2016 FINDINGS: The lungs are well-expanded. The interstitial markings are increased. The pulmonary vascularity is engorged. The cardiac silhouette is enlarged. There is a small left pleural effusion. There is increased density in the retrocardiac region on the left consistent with atelectasis or pneumonia. Subtle increased density in the right infrahilar region has developed as well. The bony thorax exhibits no acute abnormality. IMPRESSION: CHF with mild pulmonary interstitial edema more conspicuous than on the previous study. Increased density in the infrahilar regions bilaterally consistent with atelectasis or pneumonia. Small left pleural effusion. Electronically Signed   By: David  Martinique M.D.   On: 09/11/2016 11:38   Dg Abd 2 Views  Result Date: 09/08/2016 CLINICAL DATA:  Mid abdominal pain with nausea EXAM: ABDOMEN - 2 VIEW COMPARISON:  None. FINDINGS: Surgical clips in the upper abdomen. Nonobstructed bowel-gas pattern. No gross free air on decubitus view. IMPRESSION: Nonobstructed bowel-gas pattern Electronically Signed   By: Donavan Foil M.D.   On: 09/08/2016 23:14   Xr Foot Complete Right  Result Date: 08/28/2016 Radiographs of right foot show stable alignment of fixation hardware. The MT -navicular screw appears to be in talo-navicular joint.    Microbiology: Recent Results (from the past 240 hour(s))  MRSA PCR Screening     Status: None   Collection Time: 09/11/16  8:53 PM  Result Value Ref Range Status   MRSA by PCR NEGATIVE NEGATIVE Final    Comment:        The GeneXpert MRSA Assay (FDA approved for NASAL specimens only), is one component of a comprehensive MRSA colonization surveillance program. It is not intended to diagnose MRSA infection nor to guide or monitor treatment for MRSA infections.       Labs: Basic Metabolic Panel:  Recent Labs Lab 09/10/16 0722 09/11/16 0629 09/12/16 0906 09/13/16 0322 09/14/16 0645 09/15/16 0548  NA 129* 130* 132* 135 135  135 138  K 3.5 3.5 3.6 3.6 3.7  3.7 3.8  CL 99* 98* 100* 101 99*  99* 101  CO2 19* 20* 20* 24 28  29 29   GLUCOSE 105* 95 85 97 75  74 67  BUN 52* 55* 51* 27* 13  13 18   CREATININE 4.56* 4.80* 4.62* 3.23* 2.63*  2.63* 2.94*  CALCIUM 8.7* 8.6* 8.7* 8.4* 8.7*  8.7* 9.2  PHOS 5.5* 5.0* 4.6 2.9 2.5  --    Liver Function Tests:  Recent Labs Lab 09/08/16 2314 09/10/16 0722 09/11/16 0629 09/12/16 0906 09/13/16 0322 09/14/16 0645  AST 17  --   --   --   --   --  ALT 17  --   --   --   --   --   ALKPHOS 78  --   --   --   --   --   BILITOT 0.9  --   --   --   --   --   PROT 7.1  --   --   --   --   --   ALBUMIN 3.6 3.3* 3.3* 3.0* 2.8* 2.9*    Recent Labs Lab 09/08/16 2314  LIPASE 20   No results for input(s): AMMONIA in the last 168 hours. CBC:  Recent Labs Lab 09/08/16 2314 09/09/16 0601 09/10/16 0722 09/12/16 0905 09/13/16 0739  WBC 7.5 7.5 5.7 6.2 6.1  NEUTROABS 5.3  --   --   --   --   HGB 8.6* 8.6* 8.0* 7.8* 7.9*  HCT 25.6* 25.8* 23.8* 23.8* 24.8*  MCV 92.1 91.5 92.2 90.8 92.9  PLT 248 248 188 212 205   Cardiac Enzymes:  Recent Labs Lab 09/08/16 2314 09/09/16 0148 09/09/16 0954 09/09/16 1914  TROPONINI 0.03* 0.04* 0.04* 0.04*   BNP: BNP (last 3 results)  Recent Labs  08/08/16 0157 08/31/16 1041 09/08/16 2314  BNP 486.4* 1,074.1* 1,379.8*    ProBNP (last 3 results) No results for input(s): PROBNP in the last 8760 hours.  CBG:  Recent Labs Lab 09/13/16 2203 09/14/16 0730 09/14/16 1144 09/14/16 1640 09/14/16 2149  GLUCAP 125* 73 138* 161* 122*     Signed:  Eleonore Chiquito S MD.  [facility] 09/15/2016, 11:44 AM

## 2016-09-15 NOTE — Progress Notes (Signed)
[name], female   DOB: April 30, 1951, 66 y.o.   MRN: [id number]  [facility] Progress Note   Assessment/ Plan:   1. New start ESRD from progressive CKD and presentation with uremia: 3rd HD treatment today and plans for     DC later today to begin OP HD on TTS schedule at [name]. Volume status better as is the patient's mentation. 2. Anemia: low hgb noted, no overt loss, continue ESA. Low Tsat--will give IV iron with OP HD 3. CKD-MBD: calcium and phosphorus levels are within acceptable limits- not on binders 4. Nutrition:continue renal MVI and ONS 5. Hypertension:BP trend improving with HD/UF  Subjective:   Reports to be feeling better and looking forward to going home   Objective:   BP (!) 154/78   Pulse 66   Temp 98 F (36.7 C) (Oral)   Resp 17   Ht 5\' 4"  (1.626 m)   Wt 72.7 kg (160 lb 4.4 oz) Comment: stood to scale   SpO2 95%   BMI 27.51 kg/m   Physical Exam: WUX:[id number] resting in HD DGU:YQIHK irregularly irregular, normal rate Resp:CTA bilaterally, no rales/rhonchi VQQ:VZDG, flat, NT LOV:FIEPP-2+ LE edema  Labs: BMET  Recent Labs Lab 09/09/16 0601 09/10/16 9518 09/11/16 0629 09/12/16 0906 09/13/16 0322 09/14/16 0645 09/15/16 0548  NA 131* 129* 130* 132* 135 135  135 138  K 3.3* 3.5 3.5 3.6 3.6 3.7  3.7 3.8  CL 100* 99* 98* 100* 101 99*  99* 101  CO2 18* 19* 20* 20* 24 28  29 29   GLUCOSE 136* 105* 95 85 97 75  74 67  BUN 52* 52* 55* 51* 27* 13  13 18   CREATININE 4.75* 4.56* 4.80* 4.62* 3.23* 2.63*  2.63* 2.94*  CALCIUM 8.9 8.7* 8.6* 8.7* 8.4* 8.7*  8.7* 9.2  PHOS  --  5.5* 5.0* 4.6 2.9 2.5  --    CBC  Recent Labs Lab 09/08/16 2314 09/09/16 0601 09/10/16 0722 09/12/16 0905 09/13/16 0739  WBC 7.5 7.5 5.7 6.2 6.1  NEUTROABS 5.3  --   --   --   --   HGB 8.6* 8.6* 8.0* 7.8* 7.9*  HCT 25.6* 25.8* 23.8* 23.8* 24.8*  MCV 92.1 91.5 92.2 90.8 92.9  PLT 248 248 188 212 205   Medications:    . amLODipine  5 mg  Oral Daily  . aspirin EC  81 mg Oral Daily  . atorvastatin  40 mg Oral QHS  . carvedilol  12.5 mg Oral BID WC  . chlorhexidine  15 mL Mouth Rinse BID  . darbepoetin (ARANESP) injection - DIALYSIS  60 mcg Intravenous Q Fri-HD  . docusate sodium  100 mg Oral BID  . gabapentin  100 mg Oral QHS  . heparin subcutaneous  5,000 Units Subcutaneous Q8H  . insulin aspart  0-9 Units Subcutaneous TID WC  . insulin detemir  8 Units Subcutaneous QHS  . isosorbide mononitrate  60 mg Oral Daily  . lidocaine  1 application Topical TID  . loratadine  10 mg Oral Daily  . mouth rinse  15 mL Mouth Rinse q12n4p  . nicotine  21 mg Transdermal Daily  . pantoprazole  20 mg Oral BID  . sodium chloride flush  3 mL Intravenous Q12H  . technetium TC 4M diethylenetriame-pentaacetic acid  32 millicurie Intravenous Once   Elmarie Shiley, MD 09/15/2016, 10:17 AM

## 2016-09-15 NOTE — Procedures (Signed)
Patient seen on Hemodialysis. QB 300, UF goal 2.5L Treatment adjusted as needed.  Elmarie Shiley MD [name]. Office # [phone] Pager # [phone] 10:16 AM

## 2016-09-15 NOTE — Progress Notes (Signed)
Accepted at [place] 1st Treatment is: Tuesday 09/16/16 at 6:20 am  .Tentative dialysis  schedule [id number] ,Thursday, Saturday .Chairtime 6:20 am .

## 2016-09-15 NOTE — Progress Notes (Signed)
PT Cancellation Note  Patient Details Name: Robin [name] MRN: [id number] DOB: 12-Dec-1950   Cancelled Treatment:    Reason Eval/Treat Not Completed: Patient at procedure or test/unavailable   Currently in HD;  Will follow up later today as time allows;  Otherwise, will follow up for PT tomorrow;   Thank you,  Roney Marion, PT  Acute Rehabilitation Services Pager [phone] Office [phone]     Colletta [place] 09/15/2016, 9:54 AM

## 2016-09-16 DIAGNOSIS — E1129 Type 2 diabetes mellitus with other diabetic kidney complication: Secondary | ICD-10-CM | POA: Diagnosis not present

## 2016-09-22 ENCOUNTER — Telehealth (INDEPENDENT_AMBULATORY_CARE_PROVIDER_SITE_OTHER): Payer: Self-pay | Admitting: Orthopedic Surgery

## 2016-09-22 NOTE — Telephone Encounter (Signed)
Pt daughter, Calvina Liptak: [phone]--  Wanted me to inform you for an incoming referral that she is point of contact for pt and that the [facility] is going to send over a referral for pt and to please call her to sch this appt for pt.   Thanks

## 2016-09-25 NOTE — Telephone Encounter (Signed)
LVM for pts daughter to call back to reschedule appt if she wants too. Her [facility] referral is valid starting 5/10

## 2016-09-29 ENCOUNTER — Ambulatory Visit (INDEPENDENT_AMBULATORY_CARE_PROVIDER_SITE_OTHER): Payer: Medicare Other | Admitting: Orthopedic Surgery

## 2016-10-02 ENCOUNTER — Ambulatory Visit (INDEPENDENT_AMBULATORY_CARE_PROVIDER_SITE_OTHER): Payer: Medicare Other | Admitting: Orthopedic Surgery

## 2016-10-07 ENCOUNTER — Ambulatory Visit (INDEPENDENT_AMBULATORY_CARE_PROVIDER_SITE_OTHER): Payer: Medicare Other | Admitting: Orthopedic Surgery

## 2016-10-07 ENCOUNTER — Encounter (INDEPENDENT_AMBULATORY_CARE_PROVIDER_SITE_OTHER): Payer: Self-pay | Admitting: Orthopedic Surgery

## 2016-10-07 ENCOUNTER — Ambulatory Visit (INDEPENDENT_AMBULATORY_CARE_PROVIDER_SITE_OTHER): Payer: Medicare Other

## 2016-10-07 DIAGNOSIS — M79671 Pain in right foot: Secondary | ICD-10-CM | POA: Diagnosis not present

## 2016-10-07 DIAGNOSIS — E11610 Type 2 diabetes mellitus with diabetic neuropathic arthropathy: Secondary | ICD-10-CM | POA: Diagnosis not present

## 2016-10-07 DIAGNOSIS — B351 Tinea unguium: Secondary | ICD-10-CM

## 2016-10-07 HISTORY — DX: Tinea unguium: B35.1

## 2016-10-07 NOTE — Progress Notes (Signed)
Office Visit Note   Patient: Robin [name]           Date of Birth: [birth year]           MRN: [id number] Visit Date: 10/07/2016              Requested by: Center, [facility] [address] [place], [place] [address] PCP: Drain  No chief complaint on file.     HPI: Patient is status post internal fixation for Charcot collapse Lisfranc joint right foot. She is about a half weeks out from surgery she is currently ambulating in soft sneakers. Patient states that her foot is feeling better but she still has some midfoot pain on the right.  Assessment & Plan: Visit Diagnoses:  1. Right foot pain   2. Diabetic Charct's arthropathy ([place])   3. Onychomycosis     Plan: Discussed the importance of being in a stiff soled shoe to minimize stress across the Lisfranc joint. Recommended a stiff soled new balance walking sneaker that they could get at [employer]. Patient will follow-up in 4 weeks with repeat 3 view radiographs of the right foot.  Follow-Up Instructions: Return in about 4 weeks (around 11/04/2016).   Ortho Exam  Patient is alert, oriented, no adenopathy, well-dressed, normal affect, normal respiratory effort. Examination patient has a good dorsalis pedis pulse. Her foot is plantigrade she does have some prominence medially over the Charcot collapse Lisfranc complex. There is no rocker bottom deformity there is no redness no cellulitis incisions are well-healed she has good range of motion of her ankle. Patient has thick and discolored onychomycotic nails 10 she is unable to safely trim the nails on her own due to her diabetic insensate neuropathy and nails were trimmed 10 without complications.  Imaging: Xr Foot Complete Right  Result Date: 10/07/2016 Three-view radiographs of the right foot show some settling of the internal fixation for the Charcot arthropathy.   Labs: Lab Results  Component Value Date   HGBA1C 6.7 (H) 09/09/2016   HGBA1C 7.7 08/25/2016    HGBA1C 8.3 (H) 07/30/2016   LABURIC 5.4 12/01/2009   LABURIC 5.5 11/30/2009   REPTSTATUS 08/13/2016 FINAL 08/07/2016   GRAMSTAIN  03/14/2016    MODERATE WBC PRESENT,BOTH PMN AND MONONUCLEAR NO ORGANISMS SEEN    CULT  08/07/2016    NO GROWTH 5 DAYS Performed at [facility], [facility] [address] [place], [place] [address]     Orders:  Orders Placed This Encounter  Procedures  . XR Foot Complete Right   No orders of the defined types were placed in this encounter.    Procedures: No procedures performed  Clinical Data: No additional findings.  ROS:  All other systems negative, except as noted in the HPI. Review of Systems  Objective: Vital Signs: There were no vitals taken for this visit.  Specialty Comments:  No specialty comments available.  PMFS History: Patient Active Problem List   Diagnosis Date Noted  . Onychomycosis 10/07/2016  . Diabetic Charct's arthropathy ([place]) 10/07/2016  . Right foot pain 10/07/2016  . Acute on chronic respiratory failure with hypoxia ([place]) 09/09/2016  . GERD (gastroesophageal reflux disease) 09/09/2016  . Depression 09/09/2016  . Acute renal failure superimposed on stage 4 chronic kidney disease ([place]) 09/09/2016  . Chronic diastolic (congestive) heart failure ([place]) 09/09/2016  . Elevated troponin 09/09/2016  . Abdominal pain 09/09/2016  . SIRS (systemic inflammatory response syndrome) ([place]) 08/08/2016  . CHF (congestive heart failure) (  [place]) 08/07/2016  . Right leg DVT ([place]) 12/05/2015  . Charcot foot due to diabetes mellitus ([place])   . Hyperlipidemia 03/26/2015  . Charcot ankle 03/16/2015  . Confusion 01/21/2015  . Anemia 01/21/2015  . Multiple falls 01/21/2015  . Anemia, chronic renal failure   . Ventricular tachycardia ([place])   . Chronic renal disease, stage 4, severely decreased glomerular filtration rate between 15-29 mL/min/1.73 square meter (HCC)   . Anemia in chronic kidney disease 12/09/2014  . CKD (chronic  kidney disease) 12/09/2014  . Hypertensive heart/renal disease with failure ([place]) 12/09/2014  . Bipolar affective disorder ([place]) 12/09/2014  . Acute encephalopathy   . Acute delirium 11/18/2014  . Type 2 diabetes mellitus with hyperglycemia ([place]) 11/18/2014  . Essential hypertension 11/18/2014  . Chronic combined systolic and diastolic CHF (congestive heart failure) ([place]) 11/18/2014  . Fracture dislocation of ankle 11/17/2014  . DM type 2, uncontrolled, with renal complications ([place]) 67/04/4579  . Hypokalemia 11/17/2014  . Obesity 11/17/2014  . CAD (coronary artery disease), native coronary artery with 2 stents  11/17/2014   Past Medical History:  Diagnosis Date  . Anemia   . Anxiety   . Arthritis   . Benign hypertension   . Bipolar disorder ([place])   . CHF (congestive heart failure) ([place])   . Chronic kidney disease, stage V [name])    Nephrologist is with [facility] (Dr. Tammi Klippel)  . Coronary artery disease   . Depression   . Diabetes mellitus without complication ([place])   . DVT (deep venous thrombosis) (HCC)    right lower leg  . GERD (gastroesophageal reflux disease)   . Heart murmur   . History of blood transfusion   . History of bronchitis   . History of pneumonia   . Shortness of breath dyspnea   . Sleep apnea   . Type 2 diabetes mellitus (HCC)     Family History  Problem Relation Age of Onset  . Family history unknown: Yes    Past Surgical History:  Procedure Laterality Date  . ABDOMINAL HYSTERECTOMY    . ANKLE CLOSED REDUCTION N/A 11/17/2014   Procedure: CLOSED REDUCTION ANKLE;  Surgeon: Earlie Server, MD;  Location: [place];  Service: Orthopedics;  Laterality: N/A;  . ANKLE FUSION Left 03/16/2015   Procedure: Left Tibiocalcaneal Fusion;  Surgeon: Newt Minion, MD;  Location: [place];  Service: Orthopedics;  Laterality: Left;  . APPLICATION OF WOUND VAC Right 08/06/2016   Procedure: APPLICATION OF PREVENA WOUND VAC;  Surgeon: Newt Minion, MD;  Location: [place];   Service: Orthopedics;  Laterality: Right;  . AV FISTULA PLACEMENT Left [id number]   done at [place]     2 stent   . CHOLECYSTECTOMY    . CORONARY STENT PLACEMENT    . FOOT ARTHRODESIS Right 08/06/2016   Procedure: Right Foot Fusion Lisfranc Joint;  Surgeon: Newt Minion, MD;  Location: [place];  Service: Orthopedics;  Laterality: Right;  . HARDWARE REMOVAL Left 03/16/2015   Procedure: Removal Hardware Left Ankle;  Surgeon: Newt Minion, MD;  Location: [place];  Service: Orthopedics;  Laterality: Left;  . ORIF ANKLE FRACTURE Left 11/20/2014   Procedure: OPEN REDUCTION INTERNAL FIXATION (ORIF) ANKLE FRACTURE;  Surgeon: Renette Butters, MD;  Location: [place];  Service: Orthopedics;  Laterality: Left;  . TONSILLECTOMY     Social History   Occupational History  . Not on file.   Social History Main Topics  . Smoking status: Current  Every Day Smoker    Packs/day: 1.00    Types: Cigarettes  . Smokeless tobacco: Never Used  . Alcohol use No  . Drug use: No  . Sexual activity: No

## 2016-10-20 DIAGNOSIS — D509 Iron deficiency anemia, unspecified: Secondary | ICD-10-CM | POA: Insufficient documentation

## 2016-10-25 ENCOUNTER — Encounter (HOSPITAL_COMMUNITY): Payer: Self-pay | Admitting: Orthopedic Surgery

## 2016-10-25 NOTE — Addendum Note (Signed)
Addendum  created 10/25/16 1107 by Duane Boston, MD   Sign clinical note

## 2016-11-11 ENCOUNTER — Emergency Department (HOSPITAL_COMMUNITY): Payer: Non-veteran care

## 2016-11-11 ENCOUNTER — Emergency Department (HOSPITAL_COMMUNITY)
Admission: EM | Admit: 2016-11-11 | Discharge: 2016-11-11 | Disposition: A | Discharge status: Home / Self Care | Payer: Non-veteran care | Attending: Emergency Medicine | Admitting: Emergency Medicine

## 2016-11-11 DIAGNOSIS — Z7982 Long term (current) use of aspirin: Secondary | ICD-10-CM | POA: Diagnosis not present

## 2016-11-11 DIAGNOSIS — R1111 Vomiting without nausea: Secondary | ICD-10-CM | POA: Diagnosis not present

## 2016-11-11 DIAGNOSIS — R05 Cough: Secondary | ICD-10-CM | POA: Diagnosis not present

## 2016-11-11 DIAGNOSIS — I2510 Atherosclerotic heart disease of native coronary artery without angina pectoris: Secondary | ICD-10-CM | POA: Insufficient documentation

## 2016-11-11 DIAGNOSIS — Z992 Dependence on renal dialysis: Secondary | ICD-10-CM | POA: Diagnosis not present

## 2016-11-11 DIAGNOSIS — R1013 Epigastric pain: Secondary | ICD-10-CM

## 2016-11-11 DIAGNOSIS — I132 Hypertensive heart and chronic kidney disease with heart failure and with stage 5 chronic kidney disease, or end stage renal disease: Secondary | ICD-10-CM | POA: Insufficient documentation

## 2016-11-11 DIAGNOSIS — Z794 Long term (current) use of insulin: Secondary | ICD-10-CM | POA: Diagnosis not present

## 2016-11-11 DIAGNOSIS — F17210 Nicotine dependence, cigarettes, uncomplicated: Secondary | ICD-10-CM | POA: Diagnosis not present

## 2016-11-11 DIAGNOSIS — N186 End stage renal disease: Secondary | ICD-10-CM

## 2016-11-11 DIAGNOSIS — R109 Unspecified abdominal pain: Secondary | ICD-10-CM

## 2016-11-11 DIAGNOSIS — R1110 Vomiting, unspecified: Secondary | ICD-10-CM | POA: Diagnosis not present

## 2016-11-11 DIAGNOSIS — E1122 Type 2 diabetes mellitus with diabetic chronic kidney disease: Secondary | ICD-10-CM | POA: Diagnosis not present

## 2016-11-11 DIAGNOSIS — I5042 Chronic combined systolic (congestive) and diastolic (congestive) heart failure: Secondary | ICD-10-CM | POA: Diagnosis not present

## 2016-11-11 DIAGNOSIS — R112 Nausea with vomiting, unspecified: Secondary | ICD-10-CM | POA: Diagnosis not present

## 2016-11-11 DIAGNOSIS — Z79899 Other long term (current) drug therapy: Secondary | ICD-10-CM | POA: Insufficient documentation

## 2016-11-11 LAB — COMPREHENSIVE METABOLIC PANEL
ALT: 154 U/L — ABNORMAL HIGH (ref 14–54)
ANION GAP: 17 — AB (ref 5–15)
AST: 245 U/L — AB (ref 15–41)
Albumin: 4 g/dL (ref 3.5–5.0)
Alkaline Phosphatase: 98 U/L (ref 38–126)
BILIRUBIN TOTAL: 1.5 mg/dL — AB (ref 0.3–1.2)
BUN: 24 mg/dL — AB (ref 6–20)
CHLORIDE: 91 mmol/L — AB (ref 101–111)
CO2: 28 mmol/L (ref 22–32)
Calcium: 8.9 mg/dL (ref 8.9–10.3)
Creatinine, Ser: 2.53 mg/dL — ABNORMAL HIGH (ref 0.44–1.00)
GFR, EST AFRICAN AMERICAN: 22 mL/min — AB (ref >60)
GFR, EST NON AFRICAN AMERICAN: 19 mL/min — AB (ref >60)
Glucose, Bld: 306 mg/dL — ABNORMAL HIGH (ref 65–99)
POTASSIUM: 3.6 mmol/L (ref 3.5–5.1)
Sodium: 136 mmol/L (ref 135–145)
TOTAL PROTEIN: 7.4 g/dL (ref 6.5–8.1)

## 2016-11-11 LAB — CBC
HCT: 34.4 % — ABNORMAL LOW (ref 36.0–46.0)
Hemoglobin: 11.2 g/dL — ABNORMAL LOW (ref 12.0–15.0)
MCH: 31.3 pg (ref 26.0–34.0)
MCHC: 32.6 g/dL (ref 30.0–36.0)
MCV: 96.1 fL (ref 78.0–100.0)
PLATELETS: 195 10*3/uL (ref 150–400)
RBC: 3.58 MIL/uL — ABNORMAL LOW (ref 3.87–5.11)
RDW: 16.5 % — AB (ref 11.5–15.5)
WBC: 10.7 10*3/uL — ABNORMAL HIGH (ref 4.0–10.5)

## 2016-11-11 LAB — CBG MONITORING, ED: GLUCOSE-CAPILLARY: 286 mg/dL — AB (ref 65–99)

## 2016-11-11 LAB — TYPE AND SCREEN
ABO/RH(D): O POS
Antibody Screen: NEGATIVE

## 2016-11-11 LAB — LIPASE, BLOOD: LIPASE: 20 U/L (ref 11–51)

## 2016-11-11 LAB — ABO/RH: ABO/RH(D): O POS

## 2016-11-11 MED ORDER — ONDANSETRON HCL 4 MG/2ML IJ SOLN
4.0000 mg | Freq: Once | INTRAMUSCULAR | Status: AC
  Administered 2016-11-11: 4 mg via INTRAVENOUS
  Filled 2016-11-11: qty 2

## 2016-11-11 MED ORDER — MORPHINE SULFATE (PF) 4 MG/ML IV SOLN
4.0000 mg | Freq: Once | INTRAVENOUS | Status: AC
  Administered 2016-11-11: 4 mg via INTRAVENOUS
  Filled 2016-11-11: qty 1

## 2016-11-11 MED ORDER — IOPAMIDOL (ISOVUE-300) INJECTION 61%
INTRAVENOUS | Status: AC
  Filled 2016-11-11: qty 30

## 2016-11-11 MED ORDER — IOPAMIDOL (ISOVUE-300) INJECTION 61%
30.0000 mL | Freq: Once | INTRAVENOUS | Status: AC | PRN
  Administered 2016-11-11: 30 mL via ORAL

## 2016-11-11 MED ORDER — ONDANSETRON 4 MG PO TBDP: 4.0000 mg | ORAL_TABLET | Freq: Three times a day (TID) | ORAL | 0 refills | Status: DC | PRN

## 2016-11-11 MED ORDER — INSULIN ASPART 100 UNIT/ML ~~LOC~~ SOLN
5.0000 [IU] | Freq: Once | SUBCUTANEOUS | Status: AC
  Administered 2016-11-11: 5 [IU] via SUBCUTANEOUS
  Filled 2016-11-11: qty 1

## 2016-11-11 NOTE — ED Provider Notes (Signed)
[facility] DEPT Provider Note   CSN: [id number] Arrival date & time: 11/11/16  1328     History   Chief Complaint Chief Complaint  Patient presents with  . Emesis  . Cough    HPI Robin [name] is a 66 y.o. female.  HPI  Pt presenting with c/o vomiting and abdominal pain.  Pt states symptoms began 4 days ago.  She describes emesis as black in color.  Pt has hx of anemia, ESRD on dialysis, DM, HTN- she has not missed dialysis but after dialysis today was very weak, had vomited there and continued to have abdominal pain, so family brought her to the ED.  No fever.  She has been able to drink some liquids.  There are no other associated systemic symptoms, there are no other alleviating or modifying factors.   Past Medical History:  Diagnosis Date  . Anemia   . Anxiety   . Arthritis   . Benign hypertension   . Bipolar disorder ([place])   . CHF (congestive heart failure) ([place])   . Chronic kidney disease, stage V [facility])    Nephrologist is with [facility] (Dr. Tammi Klippel)  . Coronary artery disease   . Depression   . Diabetes mellitus without complication ([place])   . DVT (deep venous thrombosis) (HCC)    right lower leg  . GERD (gastroesophageal reflux disease)   . Heart murmur   . History of blood transfusion   . History of bronchitis   . History of pneumonia   . Shortness of breath dyspnea   . Sleep apnea   . Type 2 diabetes mellitus [facility])     Patient Active Problem List   Diagnosis Date Noted  . Onychomycosis 10/07/2016  . Diabetic Charct's arthropathy ([place]) 10/07/2016  . Right foot pain 10/07/2016  . Acute on chronic respiratory failure with hypoxia ([place]) 09/09/2016  . GERD (gastroesophageal reflux disease) 09/09/2016  . Depression 09/09/2016  . Acute renal failure superimposed on stage 4 chronic kidney disease ([place]) 09/09/2016  . Chronic diastolic (congestive) heart failure ([place]) 09/09/2016  . Elevated troponin 09/09/2016  . Abdominal pain 09/09/2016  . SIRS  (systemic inflammatory response syndrome) ([place]) 08/08/2016  . CHF (congestive heart failure) ([place]) 08/07/2016  . Right leg DVT ([place]) 12/05/2015  . Charcot foot due to diabetes mellitus ([place])   . Hyperlipidemia 03/26/2015  . Charcot ankle 03/16/2015  . Confusion 01/21/2015  . Anemia 01/21/2015  . Multiple falls 01/21/2015  . Anemia, chronic renal failure   . Ventricular tachycardia ([place])   . Chronic renal disease, stage 4, severely decreased glomerular filtration rate between 15-29 mL/min/1.73 square meter (HCC)   . Anemia in chronic kidney disease 12/09/2014  . CKD (chronic kidney disease) 12/09/2014  . Hypertensive heart/renal disease with failure ([place]) 12/09/2014  . Bipolar affective disorder ([place]) 12/09/2014  . Acute encephalopathy   . Acute delirium 11/18/2014  . Type 2 diabetes mellitus with hyperglycemia ([place]) 11/18/2014  . Essential hypertension 11/18/2014  . Chronic combined systolic and diastolic CHF (congestive heart failure) ([place]) 11/18/2014  . Fracture dislocation of ankle 11/17/2014  . DM type 2, uncontrolled, with renal complications ([place]) 81/19/1478  . Hypokalemia 11/17/2014  . Obesity 11/17/2014  . CAD (coronary artery disease), native coronary artery with 2 stents  11/17/2014    Past Surgical History:  Procedure Laterality Date  . ABDOMINAL HYSTERECTOMY    . ANKLE CLOSED REDUCTION N/A 11/17/2014   Procedure: CLOSED REDUCTION ANKLE;  Surgeon: Earlie Server, MD;  Location: [place];  Service: Orthopedics;  Laterality: N/A;  . ANKLE FUSION Left 03/16/2015   Procedure: Left Tibiocalcaneal Fusion;  Surgeon: Newt Minion, MD;  Location: [place];  Service: Orthopedics;  Laterality: Left;  . APPLICATION OF WOUND VAC Right 08/06/2016   Procedure: APPLICATION OF PREVENA WOUND VAC;  Surgeon: Newt Minion, MD;  Location: [place];  Service: Orthopedics;  Laterality: Right;  . AV FISTULA PLACEMENT Left [id number]   done at [place]     2 stent   .  CHOLECYSTECTOMY    . CORONARY STENT PLACEMENT    . FOOT ARTHRODESIS Right 08/06/2016   Procedure: Right Foot Fusion Lisfranc Joint;  Surgeon: Newt Minion, MD;  Location: [place];  Service: Orthopedics;  Laterality: Right;  . HARDWARE REMOVAL Left 03/16/2015   Procedure: Removal Hardware Left Ankle;  Surgeon: Newt Minion, MD;  Location: [place];  Service: Orthopedics;  Laterality: Left;  . ORIF ANKLE FRACTURE Left 11/20/2014   Procedure: OPEN REDUCTION INTERNAL FIXATION (ORIF) ANKLE FRACTURE;  Surgeon: Renette Butters, MD;  Location: [place];  Service: Orthopedics;  Laterality: Left;  . TONSILLECTOMY      OB History    No data available       Home Medications    Prior to Admission medications   Medication Sig Start Date End Date Taking? Authorizing Provider  amLODipine (NORVASC) 10 MG tablet Take 10 mg by mouth daily.    Yes [provider]  ARIPiprazole (ABILIFY) 15 MG tablet Take 7.5 mg by mouth daily.    Yes [provider]  aspirin EC 81 MG tablet Take 81 mg by mouth daily.   Yes [provider]  atorvastatin (LIPITOR) 40 MG tablet Take 40 mg by mouth at bedtime.    Yes [provider]  carvedilol (COREG) 25 MG tablet Take 25 mg by mouth 2 (two) times daily with a meal.   Yes [provider]  docusate sodium (COLACE) 100 MG capsule Take 100 mg by mouth 2 (two) times daily.   Yes [provider]  ferrous sulfate 325 (65 FE) MG tablet Take 325 mg by mouth 2 (two) times daily with a meal.   Yes [provider]  gabapentin (NEURONTIN) 100 MG capsule Take 100 mg by mouth at bedtime.   Yes [provider]  hydrALAZINE (APRESOLINE) 50 MG tablet Take 50 mg by mouth 2 (two) times daily.    Yes [provider]  insulin detemir (LEVEMIR) 100 UNIT/ML injection Inject 0.08 mLs (8 Units total) into the skin at bedtime. 09/15/16  Yes Darrick Meigs, Marge Duncans, MD  insulin lispro (HUMALOG) 100 UNIT/ML injection Inject 2-10 Units into  the skin 3 (three) times daily before meals. Pt uses as needed per sliding scale:    150-200:  2 units 201-250:  4 units 251-300:  6 units 301-350:  8 units 351-400:  10 units and call MD   Yes [provider]  isosorbide mononitrate (IMDUR) 60 MG 24 hr tablet Take 60 mg by mouth daily.    Yes [provider]  lidocaine (LMX) 4 % cream Apply 1 application topically 3 (three) times daily.   Yes [provider]  nicotine (NICODERM CQ - DOSED IN MG/24 HOURS) 21 mg/24hr patch Place 21 mg onto the skin daily.    Yes [provider]  pantoprazole (PROTONIX) 20 MG tablet Take 20 mg by mouth 2 (two) times daily.    Yes [provider]  Vitamin D, Ergocalciferol, (DRISDOL) 50000 units CAPS capsule Take 50,000 Units by mouth every Monday.   Yes [provider]  ammonium lactate (LAC-HYDRIN) 12 % lotion Apply 1 application topically 2 (two) times daily as needed (for itching/irritation).     [provider]  ARIPiprazole (ABILIFY) 5 MG tablet Take 2.5 mg by mouth at bedtime as needed (for agitation).     [provider]  dextrose (GLUTOSE) 40 % GEL Take 1 Tube by mouth once as needed (for blood sugar less than 60).     [provider]  Melatonin 3 MG TABS Take 6 mg by mouth at bedtime as needed (for sleep).    [provider]  nitroGLYCERIN (NITROSTAT) 0.4 MG SL tablet Place 0.4 mg under the tongue every 5 (five) minutes as needed for chest pain.    [provider]  ondansetron (ZOFRAN ODT) 4 MG disintegrating tablet Take 1 tablet (4 mg total) by mouth every 8 (eight) hours as needed for nausea or vomiting. 11/11/16   Alfonzo Beers, MD  polyethylene glycol (MIRALAX / GLYCOLAX) packet Take 17 g by mouth daily as needed for mild constipation.     [provider]  sennosides-docusate sodium (SENOKOT-S) 8.6-50 MG tablet Take 2 tablets by mouth 2 (two) times daily as needed for constipation.     [provider]    Family History Family History  Problem Relation Age of Onset  . Family history unknown: Yes    Social History Social History  Substance Use Topics  . Smoking status: Current Every Day Smoker    Packs/day: 1.00    Types: Cigarettes  . Smokeless tobacco: Never Used  . Alcohol use No     Allergies   Patient has no known allergies.   Review of Systems Review of Systems  ROS reviewed and all otherwise negative except for mentioned in HPI   Physical Exam Updated Vital Signs BP (!) 145/76 (BP Location: Left Arm)   Pulse 79   Temp 98 F (36.7 C) (Oral)   Resp 20   Ht 5\' 4"  (1.626 m)   Wt 65.3 kg (144 lb)   SpO2 95%   BMI 24.72 kg/m  Vitals reviewed Physical Exam  Physical Examination: General appearance - alert, well appearing, and in no distress Mental status - alert, oriented to person, place, and time Eyes - no conjunctival injection, no scleral icterus Mouth - mucous membranes moist, pharynx normal without lesions Neck - supple, no significant adenopathy Chest - clear to auscultation, no wheezes, rales or rhonchi, symmetric air entry Heart - normal rate, regular rhythm, normal S1, S2, no murmurs, rubs, clicks or gallops Abdomen - soft, mild epigastric tenderness to palpation no gaurding or rebound tenderness, nabs, nondistended, no masses or organomegaly Neurological - alert, oriented, normal speech Extremities - peripheral pulses normal, no pedal edema, no clubbing or cyanosis Skin - normal coloration and turgor, no rashes   ED Treatments / Results  Labs (all labs ordered are listed, but only abnormal results are displayed) Labs Reviewed  CBC - Abnormal; Notable for the following:       Result Value   WBC 10.7 (*)    RBC 3.58 (*)    Hemoglobin 11.2 (*)    HCT 34.4 (*)    RDW 16.5 (*)    All other components within normal limits  COMPREHENSIVE METABOLIC PANEL - Abnormal; Notable for the following:    Chloride 91 (*)    Glucose, Bld  306 (*)  BUN 24 (*)    Creatinine, Ser 2.53 (*)    AST 245 (*)    ALT 154 (*)    Total Bilirubin 1.5 (*)    GFR calc non Af Amer 19 (*)    GFR calc Af Amer 22 (*)    Anion gap 17 (*)    All other components within normal limits  CBG MONITORING, ED - Abnormal; Notable for the following:    Glucose-Capillary 286 (*)    All other components within normal limits  LIPASE, BLOOD  TYPE AND SCREEN  ABO/RH    EKG  EKG Interpretation None       Radiology Ct Abdomen Pelvis Wo Contrast  Result Date: 11/11/2016 CLINICAL DATA:  Multiple episodes of diarrhea and vomiting, vomited but material 8 times, on dialysis with last dialysis treatment yesterday, taking a hand, history pancreatitis, coronary artery disease, CHF, type II diabetes mellitus, GERD EXAM: CT ABDOMEN AND PELVIS WITHOUT CONTRAST TECHNIQUE: Multidetector CT imaging of the abdomen and pelvis was performed following the standard protocol without IV contrast. Sagittal and coronal MPR images reconstructed from axial data set. Oral contrast was administered. COMPARISON:  09/09/2016 FINDINGS: Lower chest: Respiratory motion artifacts at lung bases. Peribronchial thickening. Question mild RIGHT middle lobe infiltrate. Hepatobiliary: Gallbladder surgically absent.  Liver unremarkable Pancreas: Normal appearance Spleen: Normal appearance Adrenals/Urinary Tract: Adrenal glands, kidneys, ureters, and decompressed bladder unremarkable. Stomach/Bowel: Normal appendix. Colon unopacified and under distended limiting assessment of wall thickness, grossly similar to prior study. Stomach and small bowel loops grossly normal appearance. Vascular/Lymphatic: Atherosclerotic calcifications aorta and iliac arteries without aneurysm. Scattered pelvic phleboliths. No adenopathy. Reproductive: Uterus surgically absent with normal appearance of RIGHT ovary and nonvisualization of LEFT ovary. Other: No free air or free fluid. No hernia or definite inflammatory  process. Musculoskeletal: Osseous demineralization. Superior endplate compression deformities at T9 and T11 unchanged. IMPRESSION: No definite acute intra-abdominal or intrapelvic abnormalities. Underdistention of the colon diffusely as discussed above. Questionable RIGHT middle lobe infiltrate. Electronically Signed   By: Lavonia Dana M.D.   On: 11/11/2016 19:28   Dg Chest 2 View  Result Date: 11/11/2016 CLINICAL DATA:  66 year old female with cough and vomiting. EXAM: CHEST  2 VIEW COMPARISON:  Chest radiograph dated 09/11/2016 FINDINGS: There is mild cardiomegaly with mild vascular congestion. No focal consolidation, pleural effusion, or pneumothorax. No acute osseous pathology. IMPRESSION: Mild cardiomegaly with mild vascular congestion. No focal consolidation. Electronically Signed   By: Anner Crete M.D.   On: 11/11/2016 20:19    Procedures Procedures (including critical care time)  Medications Ordered in ED Medications  iopamidol (ISOVUE-300) 61 % injection (not administered)  ondansetron (ZOFRAN) injection 4 mg (4 mg Intravenous Given 11/11/16 1708)  morphine 4 MG/ML injection 4 mg (4 mg Intravenous Given 11/11/16 1708)  insulin aspart (novoLOG) injection 5 Units (5 Units Subcutaneous Given 11/11/16 1814)  iopamidol (ISOVUE-300) 61 % injection 30 mL (30 mLs Oral Contrast Given 11/11/16 0515)     Initial Impression / Assessment and Plan / ED Course  I have reviewed the triage vital signs and the nursing notes.  Pertinent labs & imaging results that were available during my care of the patient were reviewed by me and considered in my medical decision making (see chart for details).     Pt presenting with c/o abdominal pain associated with vomiting.  She also felt weak today after dialysis.  Labs are reassuring with mild anemia but improved compare to recent values.  Abdominal CT does not show  any acute findings.  Glucose improving after subQ insulin.  Pt is able to tolereate po fluids  after nausea and pain meds and appears to feel improved on recheck.  Discharged with strict return precautions.  Pt agreeable with plan.  Final Clinical Impressions(s) / ED Diagnoses   Final diagnoses:  Non-intractable vomiting with nausea, unspecified vomiting type  Epigastric pain  End stage renal disease on dialysis [facility])    New Prescriptions Discharge Medication List as of 11/11/2016  9:31 PM    START taking these medications   Details  ondansetron (ZOFRAN ODT) 4 MG disintegrating tablet Take 1 tablet (4 mg total) by mouth every 8 (eight) hours as needed for nausea or vomiting., Starting Tue 11/11/2016, Print         Alfonzo Beers, MD 11/12/16 [phone]

## 2016-11-11 NOTE — ED Triage Notes (Addendum)
Pt states she has been having multiple episodes of emesis and diarrhea. Pt has "vomited black" approximately 8 times.  Pt is on dialysis and states her last dialysis treatment was yesterday. Pt states she is taking iron per the doctors at the [place].  Pt reports being unable to eat anything since Saturday. Pt also reports a history of pancreatitis.

## 2016-11-11 NOTE — ED Notes (Signed)
Recheck of CBG of 286mg /dL. Patient is being transported to CT.

## 2016-11-11 NOTE — ED Notes (Signed)
Pt in CT.

## 2016-11-11 NOTE — ED Notes (Signed)
Writer provided pt with a cup of diet ginger ale for fluid challenge

## 2016-11-11 NOTE — Discharge Instructions (Signed)
Return to the ED with any concerns including vomiting and not able to keep down liquids or your medications, abdominal pain especially if it localizes to the right lower abdomen, fever or chills, and decreased urine output, decreased level of alertness or lethargy, or any other alarming symptoms.  °

## 2017-01-01 ENCOUNTER — Emergency Department (HOSPITAL_COMMUNITY)
Admission: EM | Admit: 2017-01-01 | Discharge: 2017-01-01 | Disposition: A | Discharge status: Home / Self Care | Payer: Non-veteran care | Attending: Emergency Medicine | Admitting: Emergency Medicine

## 2017-01-01 ENCOUNTER — Encounter (HOSPITAL_COMMUNITY): Payer: Self-pay

## 2017-01-01 DIAGNOSIS — E119 Type 2 diabetes mellitus without complications: Secondary | ICD-10-CM | POA: Diagnosis not present

## 2017-01-01 DIAGNOSIS — Y69 Unspecified misadventure during surgical and medical care: Secondary | ICD-10-CM | POA: Insufficient documentation

## 2017-01-01 DIAGNOSIS — I2510 Atherosclerotic heart disease of native coronary artery without angina pectoris: Secondary | ICD-10-CM | POA: Diagnosis not present

## 2017-01-01 DIAGNOSIS — Z79899 Other long term (current) drug therapy: Secondary | ICD-10-CM | POA: Insufficient documentation

## 2017-01-01 DIAGNOSIS — T82599A Other mechanical complication of unspecified cardiac and vascular devices and implants, initial encounter: Secondary | ICD-10-CM | POA: Diagnosis not present

## 2017-01-01 DIAGNOSIS — I5032 Chronic diastolic (congestive) heart failure: Secondary | ICD-10-CM | POA: Diagnosis not present

## 2017-01-01 DIAGNOSIS — Z955 Presence of coronary angioplasty implant and graft: Secondary | ICD-10-CM | POA: Diagnosis not present

## 2017-01-01 DIAGNOSIS — Z452 Encounter for adjustment and management of vascular access device: Secondary | ICD-10-CM | POA: Insufficient documentation

## 2017-01-01 DIAGNOSIS — R58 Hemorrhage, not elsewhere classified: Secondary | ICD-10-CM

## 2017-01-01 DIAGNOSIS — N185 Chronic kidney disease, stage 5: Secondary | ICD-10-CM | POA: Diagnosis not present

## 2017-01-01 DIAGNOSIS — F17210 Nicotine dependence, cigarettes, uncomplicated: Secondary | ICD-10-CM | POA: Insufficient documentation

## 2017-01-01 DIAGNOSIS — T82838A Hemorrhage of vascular prosthetic devices, implants and grafts, initial encounter: Secondary | ICD-10-CM | POA: Diagnosis not present

## 2017-01-01 DIAGNOSIS — Z7982 Long term (current) use of aspirin: Secondary | ICD-10-CM | POA: Insufficient documentation

## 2017-01-01 DIAGNOSIS — I120 Hypertensive chronic kidney disease with stage 5 chronic kidney disease or end stage renal disease: Secondary | ICD-10-CM | POA: Diagnosis not present

## 2017-01-01 DIAGNOSIS — Z794 Long term (current) use of insulin: Secondary | ICD-10-CM | POA: Diagnosis not present

## 2017-01-01 DIAGNOSIS — T829XXA Unspecified complication of cardiac and vascular prosthetic device, implant and graft, initial encounter: Secondary | ICD-10-CM

## 2017-01-01 NOTE — ED Provider Notes (Signed)
[facility] DEPT Provider Note   CSN: [id number] Arrival date & time: 01/01/17  1218     History   Chief Complaint Chief Complaint  Patient presents with  . Vascular Access Problem  . bleeding from dialysis access    HPI Robin [name] is a 66 y.o. female.  HPI Robin [name] is a 66 y.o. female presents to emergency department from dialysis center with complaint of bleeding fistula. Patient apparently was approximately 3 hours into her dialysis when it began to bleed. Pressure was applied and patient was sent here. Patient denies any prior similar symptoms. She denies any pain to her arm. No numbness or tingling to her hand. She has no other complaints.  Past Medical History:  Diagnosis Date  . Anemia   . Anxiety   . Arthritis   . Benign hypertension   . Bipolar disorder ([place])   . CHF (congestive heart failure) ([place])   . Chronic kidney disease, stage V [facility])    Nephrologist is with [facility] (Dr. Tammi Klippel)  . Coronary artery disease   . Depression   . Diabetes mellitus without complication ([place])   . DVT (deep venous thrombosis) (HCC)    right lower leg  . GERD (gastroesophageal reflux disease)   . Heart murmur   . History of blood transfusion   . History of bronchitis   . History of pneumonia   . Shortness of breath dyspnea   . Sleep apnea   . Type 2 diabetes mellitus [facility])     Patient Active Problem List   Diagnosis Date Noted  . Onychomycosis 10/07/2016  . Diabetic Charct's arthropathy ([place]) 10/07/2016  . Right foot pain 10/07/2016  . Acute on chronic respiratory failure with hypoxia ([place]) 09/09/2016  . GERD (gastroesophageal reflux disease) 09/09/2016  . Depression 09/09/2016  . Acute renal failure superimposed on stage 4 chronic kidney disease ([place]) 09/09/2016  . Chronic diastolic (congestive) heart failure ([place]) 09/09/2016  . Elevated troponin 09/09/2016  . Abdominal pain 09/09/2016  . SIRS (systemic inflammatory response syndrome) ([place]) 08/08/2016  .  CHF (congestive heart failure) ([place]) 08/07/2016  . Right leg DVT ([place]) 12/05/2015  . Charcot foot due to diabetes mellitus ([place])   . Hyperlipidemia 03/26/2015  . Charcot ankle 03/16/2015  . Confusion 01/21/2015  . Anemia 01/21/2015  . Multiple falls 01/21/2015  . Anemia, chronic renal failure   . Ventricular tachycardia ([place])   . Chronic renal disease, stage 4, severely decreased glomerular filtration rate between 15-29 mL/min/1.73 square meter (HCC)   . Anemia in chronic kidney disease 12/09/2014  . CKD (chronic kidney disease) 12/09/2014  . Hypertensive heart/renal disease with failure ([place]) 12/09/2014  . Bipolar affective disorder ([place]) 12/09/2014  . Acute encephalopathy   . Acute delirium 11/18/2014  . Type 2 diabetes mellitus with hyperglycemia ([place]) 11/18/2014  . Essential hypertension 11/18/2014  . Chronic combined systolic and diastolic CHF (congestive heart failure) ([place]) 11/18/2014  . Fracture dislocation of ankle 11/17/2014  . DM type 2, uncontrolled, with renal complications ([place]) 68/04/7516  . Hypokalemia 11/17/2014  . Obesity 11/17/2014  . CAD (coronary artery disease), native coronary artery with 2 stents  11/17/2014    Past Surgical History:  Procedure Laterality Date  . ABDOMINAL HYSTERECTOMY    . ANKLE CLOSED REDUCTION N/A 11/17/2014   Procedure: CLOSED REDUCTION ANKLE;  Surgeon: Earlie Server, MD;  Location: [place];  Service: Orthopedics;  Laterality: N/A;  . ANKLE FUSION Left 03/16/2015   Procedure: Left Tibiocalcaneal Fusion;  Surgeon: Beverely Low  Fernanda Drum, MD;  Location: [place];  Service: Orthopedics;  Laterality: Left;  . APPLICATION OF WOUND VAC Right 08/06/2016   Procedure: APPLICATION OF PREVENA WOUND VAC;  Surgeon: Newt Minion, MD;  Location: [place];  Service: Orthopedics;  Laterality: Right;  . AV FISTULA PLACEMENT Left [id number]   done at [facility]     2 stent   . CHOLECYSTECTOMY    . CORONARY STENT PLACEMENT    . FOOT  ARTHRODESIS Right 08/06/2016   Procedure: Right Foot Fusion Lisfranc Joint;  Surgeon: Newt Minion, MD;  Location: [place];  Service: Orthopedics;  Laterality: Right;  . HARDWARE REMOVAL Left 03/16/2015   Procedure: Removal Hardware Left Ankle;  Surgeon: Newt Minion, MD;  Location: [place];  Service: Orthopedics;  Laterality: Left;  . ORIF ANKLE FRACTURE Left 11/20/2014   Procedure: OPEN REDUCTION INTERNAL FIXATION (ORIF) ANKLE FRACTURE;  Surgeon: Renette Butters, MD;  Location: [place];  Service: Orthopedics;  Laterality: Left;  . TONSILLECTOMY      OB History    No data available       Home Medications    Prior to Admission medications   Medication Sig Start Date End Date Taking? Authorizing Provider  amLODipine (NORVASC) 10 MG tablet Take 10 mg by mouth daily.     [provider]  ammonium lactate (LAC-HYDRIN) 12 % lotion Apply 1 application topically 2 (two) times daily as needed (for itching/irritation).     [provider]  ARIPiprazole (ABILIFY) 15 MG tablet Take 7.5 mg by mouth daily.     [provider]  ARIPiprazole (ABILIFY) 5 MG tablet Take 2.5 mg by mouth at bedtime as needed (for agitation).     [provider]  aspirin EC 81 MG tablet Take 81 mg by mouth daily.    [provider]  atorvastatin (LIPITOR) 40 MG tablet Take 40 mg by mouth at bedtime.     [provider]  carvedilol (COREG) 25 MG tablet Take 25 mg by mouth 2 (two) times daily with a meal.    [provider]  dextrose (GLUTOSE) 40 % GEL Take 1 Tube by mouth once as needed (for blood sugar less than 60).     [provider]  docusate sodium (COLACE) 100 MG capsule Take 100 mg by mouth 2 (two) times daily.    [provider]  ferrous sulfate 325 (65 FE) MG tablet Take 325 mg by mouth 2 (two) times daily with a meal.    [provider]  gabapentin (NEURONTIN) 100 MG capsule Take 100 mg by mouth at bedtime.    [provider]  hydrALAZINE (APRESOLINE) 50 MG tablet Take 50 mg by mouth 2 (two) times daily.     [provider]  insulin detemir (LEVEMIR) 100 UNIT/ML injection Inject 0.08 mLs (8 Units total) into the skin at bedtime. 09/15/16   Oswald Hillock, MD  insulin lispro (HUMALOG) 100 UNIT/ML injection Inject 2-10 Units into the skin 3 (three) times daily before meals. Pt uses as needed per sliding scale:    150-200:  2 units 201-250:  4 units 251-300:  6 units 301-350:  8 units 351-400:  10 units and call MD    [provider]  isosorbide mononitrate (IMDUR) 60 MG 24 hr tablet Take 60 mg by mouth daily.     [provider]  lidocaine (LMX) 4 % cream Apply 1 application topically 3 (three)  times daily.    [provider]  Melatonin 3 MG TABS Take 6 mg by mouth at bedtime as needed (for sleep).    [provider]  nicotine (NICODERM CQ - DOSED IN MG/24 HOURS) 21 mg/24hr patch Place 21 mg onto the skin daily.     [provider]  nitroGLYCERIN (NITROSTAT) 0.4 MG SL tablet Place 0.4 mg under the tongue every 5 (five) minutes as needed for chest pain.    [provider]  ondansetron (ZOFRAN ODT) 4 MG disintegrating tablet Take 1 tablet (4 mg total) by mouth every 8 (eight) hours as needed for nausea or vomiting. 11/11/16   Mabe, Forbes Cellar, MD  pantoprazole (PROTONIX) 20 MG tablet Take 20 mg by mouth 2 (two) times daily.     [provider]  polyethylene glycol (MIRALAX / GLYCOLAX) packet Take 17 g by mouth daily as needed for mild constipation.     [provider]  sennosides-docusate sodium (SENOKOT-S) 8.6-50 MG tablet Take 2 tablets by mouth 2 (two) times daily as needed for constipation.     [provider]  Vitamin D, Ergocalciferol, (DRISDOL) 50000 units CAPS capsule Take 50,000 Units by mouth every Monday.    [provider]    Family History Family History  Problem Relation Age of Onset  . Family  history unknown: Yes    Social History Social History  Substance Use Topics  . Smoking status: Current Every Day Smoker    Packs/day: 1.00    Types: Cigarettes  . Smokeless tobacco: Never Used  . Alcohol use No     Allergies   Patient has no known allergies.   Review of Systems Review of Systems  Musculoskeletal: Negative for arthralgias and myalgias.  Neurological: Negative for weakness and numbness.  Hematological: Bruises/bleeds easily.  All other systems reviewed and are negative.    Physical Exam Updated Vital Signs BP (!) 182/91 (BP Location: Right Arm)   Pulse 66   Temp 98 F (36.7 C) (Oral)   Resp 18   SpO2 98%   Physical Exam  Constitutional: She appears well-developed and well-nourished. No distress.  Eyes: Conjunctivae are normal.  Neck: Neck supple.  Musculoskeletal:  Fistula to the left upper arm, actively bleeding. Distal radial pulse intact. Hand is warm and pink.  Neurological: She is alert.  Skin: Skin is warm and dry.  Nursing note and vitals reviewed.    ED Treatments / Results  Labs (all labs ordered are listed, but only abnormal results are displayed) Labs Reviewed - No data to display  EKG  EKG Interpretation None       Radiology No results found.  Procedures Procedures (including critical care time)  Medications Ordered in ED Medications - No data to display   Initial Impression / Assessment and Plan / ED Course  I have reviewed the triage vital signs and the nursing notes.  Pertinent labs & imaging results that were available during my care of the patient were reviewed by me and considered in my medical decision making (see chart for details).     Patient is from dialysis with bleeding fistula. All dressings removed, fistula still bleeding. Another pressure dressing applied. Will monitor. Discussed with Dr. Rogene Houston who was inpatient as well.  Some bleeding through the dressing. Combat gauze applied with ACE wrap.  Will monitor.   3:03 PM Patient monitoring department, no more bleeding. In fact when dressing was changed, the bleeding from the fistula stopped at this time.  Will discharge home with continued pressure with combat gauze and Ace wrap. Advised to not take it off until next dialysis. At time of discharge, hand is pink, warm, good capillary refill distally. Distal radial pulses intact. Return precautions discussed.  Vitals:   01/01/17 1224 01/01/17 1439  BP: (!) 182/91 (!) 164/67  Pulse: 66 72  Resp: 18 16  Temp: 98 F (36.7 C)   TempSrc: Oral   SpO2: 98% 97%     Final Clinical Impressions(s) / ED Diagnoses   Final diagnoses:  Complication of vascular access for dialysis, initial encounter  Bleeding    New Prescriptions New Prescriptions   No medications on file     Jeannett Senior, PA-C 01/01/17 1504    Fredia Sorrow, MD 01/02/17 [phone]

## 2017-01-01 NOTE — Discharge Instructions (Signed)
Continue to keep pressure dressing on the bleeding site until your next dialysis. If your left hand becomes numb, cold, painful, you  can loosen the dressing slightly. Return if bleeding worsens.

## 2017-01-01 NOTE — ED Provider Notes (Signed)
Medical screening examination/treatment/procedure(s) were conducted as a shared visit with non-physician practitioner(s) and myself.  I personally evaluated the patient during the encounter.   EKG Interpretation None       Patient seen by me along with physician assistant. Patient is a dialysis patient. She was sent from dialysis center for bleeding from the AV fistula in her left arm. Patient did complete dialysis. Upon arrival there was arterial bleeding from the fistula. That was dressed and wrapped with Ace wrap but shortly thereafter bled through. We went ahead and placed a combat gauze. Actually peeling the dressing back there was no active bleeding at that time. Arm was rewrapped. Leading appears to be controlled. Will have patient keep a dressing in place until she dialyzed again on Saturday.  The bleeding was from the puncture site in the AV fistula. There was no evidence of any significant tear or large hole. Good thrill.   Fredia Sorrow, MD 01/01/17 [phone]

## 2017-01-01 NOTE — ED Notes (Signed)
Blood is observed to be coming through coban and gauze wrap. Dr Rogene Houston ordered to placed combat gauze. Coban and gauze removed soaked in blood, combat gauze placed with ace wrap over combat gauze. CMS intact. Pt is comfortable and tolerating well.

## 2017-01-01 NOTE — ED Notes (Signed)
EDP speaking with patient. Patient eating at this time. Family at bedside,

## 2017-01-01 NOTE — ED Notes (Signed)
Patient sitting on side of bed family at bedside.

## 2017-01-01 NOTE — ED Triage Notes (Signed)
Per EMS, pt from kidney center, Pt Received 3 of the 4 hours treatment that she is supposed to have. Could not get port to stop bleeding, ems was called. Pressure applied and bleeding controlled. VS 170/86, HR 74, RR 14, 98% on RA, CBG 168. Pt has no complaints.

## 2017-01-01 NOTE — ED Notes (Signed)
Gauzed placed at dialysis center removed and assessed by Tatyana, moderate amount of blood began to come out of access site, pressure dressing reapplied. Pt still has no complaints.

## 2017-01-05 DIAGNOSIS — T82858A Stenosis of vascular prosthetic devices, implants and grafts, initial encounter: Secondary | ICD-10-CM | POA: Diagnosis not present

## 2017-01-05 DIAGNOSIS — Z992 Dependence on renal dialysis: Secondary | ICD-10-CM | POA: Diagnosis not present

## 2017-01-05 DIAGNOSIS — I871 Compression of vein: Secondary | ICD-10-CM | POA: Diagnosis not present

## 2017-01-05 DIAGNOSIS — N186 End stage renal disease: Secondary | ICD-10-CM | POA: Diagnosis not present

## 2017-01-19 DIAGNOSIS — L0501 Pilonidal cyst with abscess: Secondary | ICD-10-CM | POA: Diagnosis not present

## 2017-02-02 ENCOUNTER — Ambulatory Visit (INDEPENDENT_AMBULATORY_CARE_PROVIDER_SITE_OTHER): Payer: Medicare Other | Admitting: Orthopedic Surgery

## 2017-02-02 ENCOUNTER — Encounter (INDEPENDENT_AMBULATORY_CARE_PROVIDER_SITE_OTHER): Payer: Self-pay | Admitting: Orthopedic Surgery

## 2017-02-02 DIAGNOSIS — I2510 Atherosclerotic heart disease of native coronary artery without angina pectoris: Secondary | ICD-10-CM

## 2017-02-02 DIAGNOSIS — B351 Tinea unguium: Secondary | ICD-10-CM | POA: Diagnosis not present

## 2017-02-02 DIAGNOSIS — I872 Venous insufficiency (chronic) (peripheral): Secondary | ICD-10-CM

## 2017-02-02 DIAGNOSIS — E1165 Type 2 diabetes mellitus with hyperglycemia: Secondary | ICD-10-CM | POA: Diagnosis not present

## 2017-02-02 DIAGNOSIS — E1121 Type 2 diabetes mellitus with diabetic nephropathy: Secondary | ICD-10-CM

## 2017-02-02 DIAGNOSIS — E11610 Type 2 diabetes mellitus with diabetic neuropathic arthropathy: Secondary | ICD-10-CM

## 2017-02-02 NOTE — Progress Notes (Signed)
Office Visit Note   Patient: Robin [name]           Date of Birth: 1951/02/01           MRN: [id number] Visit Date: 02/02/2017              Requested by: Center, [facility] [address] [place], [place] [address] PCP: [place]  Chief Complaint  Patient presents with  . Right Ankle - Pain  . Left Ankle - Pain      HPI: 85The patient is a 66 year old woman who presents today complaining of bilateral foot and ankle swelling this is associated with pain especially with ambulation. She states she tries to elevate her feet but has not had improvement swelling with elevation. Of note the patient is currently on dialysis also has history of heart failure. Does not wear compression stockings. Has stockings but does not wear them states they are uncomfortable.  She does have thickened and onychomycotic nails 10 requests a Trim.  Assessment & Plan: Visit Diagnoses:  1. Onychomycosis   2. Uncontrolled type 2 diabetes mellitus with diabetic nephropathy, unspecified whether long term insulin use (HCC)   3. Venous insufficiency (chronic) (peripheral)     Plan: Nails trimmed 10. Recommended compression stockings and elevation helpful as well. She'll follow-up in office in 3 months sooner should she have any concerns in the meantime.  Follow-Up Instructions: Return in about 3 months (around 05/04/2017).   Ortho Exam  Patient is alert, oriented, no adenopathy, well-dressed, normal affect, normal respiratory effort. Well-healed surgical scars to the left foot and right ankle.  trace edema bilateral feet and ankles. Him thickened and discolored onychomycotic nails 10 due to the scissors from her own nails. Nails 10 without incident.  Imaging: No results found. No images are attached to the encounter.  Labs: Lab Results  Component Value Date   HGBA1C 6.7 (H) 09/09/2016   HGBA1C 7.7 08/25/2016   HGBA1C 8.3 (H) 07/30/2016   LABURIC 5.4 12/01/2009   LABURIC 5.5  11/30/2009   REPTSTATUS 08/13/2016 FINAL 08/07/2016   GRAMSTAIN  03/14/2016    MODERATE WBC PRESENT,BOTH PMN AND MONONUCLEAR NO ORGANISMS SEEN    CULT  08/07/2016    NO GROWTH 5 DAYS Performed at [facility], [place] [address] [place],  [address]     Orders:  No orders of the defined types were placed in this encounter.  No orders of the defined types were placed in this encounter.    Procedures: No procedures performed  Clinical Data: No additional findings.  ROS:  All other systems negative, except as noted in the HPI. Review of Systems  Constitutional: Negative for chills and fever.  Cardiovascular: Positive for leg swelling.  Musculoskeletal: Positive for myalgias.  Skin: Negative for color change and wound.  Neurological: Negative for weakness and numbness.    Objective: Vital Signs: There were no vitals taken for this visit.  Specialty Comments:  No specialty comments available.  PMFS History: Patient Active Problem List   Diagnosis Date Noted  . Onychomycosis 10/07/2016  . Diabetic Charct's arthropathy ([place]) 10/07/2016  . Right foot pain 10/07/2016  . Acute on chronic respiratory failure with hypoxia ([place]) 09/09/2016  . GERD (gastroesophageal reflux disease) 09/09/2016  . Depression 09/09/2016  . Acute renal failure superimposed on stage 4 chronic kidney disease ([place]) 09/09/2016  . Chronic diastolic (congestive) heart failure ([place]) 09/09/2016  . Elevated troponin 09/09/2016  . Abdominal pain 09/09/2016  .  SIRS (systemic inflammatory response syndrome) ([place]) 08/08/2016  . CHF (congestive heart failure) ([place]) 08/07/2016  . Right leg DVT ([place]) 12/05/2015  . Charcot foot due to diabetes mellitus ([place])   . Hyperlipidemia 03/26/2015  . Charcot ankle 03/16/2015  . Confusion 01/21/2015  . Anemia 01/21/2015  . Multiple falls 01/21/2015  . Anemia, chronic renal failure   . Ventricular tachycardia ([place])   . Chronic renal disease, stage 4,  severely decreased glomerular filtration rate between 15-29 mL/min/1.73 square meter (HCC)   . Anemia in chronic kidney disease 12/09/2014  . CKD (chronic kidney disease) 12/09/2014  . Hypertensive heart/renal disease with failure ([place]) 12/09/2014  . Bipolar affective disorder ([place]) 12/09/2014  . Acute encephalopathy   . Acute delirium 11/18/2014  . Type 2 diabetes mellitus with hyperglycemia ([place]) 11/18/2014  . Essential hypertension 11/18/2014  . Chronic combined systolic and diastolic CHF (congestive heart failure) ([place]) 11/18/2014  . Fracture dislocation of ankle 11/17/2014  . DM type 2, uncontrolled, with renal complications ([place]) 25/95/6387  . Hypokalemia 11/17/2014  . Obesity 11/17/2014  . CAD (coronary artery disease), native coronary artery with 2 stents  11/17/2014   Past Medical History:  Diagnosis Date  . Anemia   . Anxiety   . Arthritis   . Benign hypertension   . Bipolar disorder ([place])   . CHF (congestive heart failure) ([place])   . Chronic kidney disease, stage V [facility])    Nephrologist is with [facility] (Dr. Tammi Klippel)  . Coronary artery disease   . Depression   . Diabetes mellitus without complication ([place])   . DVT (deep venous thrombosis) (HCC)    right lower leg  . GERD (gastroesophageal reflux disease)   . Heart murmur   . History of blood transfusion   . History of bronchitis   . History of pneumonia   . Shortness of breath dyspnea   . Sleep apnea   . Type 2 diabetes mellitus (HCC)     Family History  Problem Relation Age of Onset  . Family history unknown: Yes    Past Surgical History:  Procedure Laterality Date  . ABDOMINAL HYSTERECTOMY    . ANKLE CLOSED REDUCTION N/A 11/17/2014   Procedure: CLOSED REDUCTION ANKLE;  Surgeon: Earlie Server, MD;  Location: [place];  Service: Orthopedics;  Laterality: N/A;  . ANKLE FUSION Left 03/16/2015   Procedure: Left Tibiocalcaneal Fusion;  Surgeon: Newt Minion, MD;  Location: [place];  Service: Orthopedics;   Laterality: Left;  . APPLICATION OF WOUND VAC Right 08/06/2016   Procedure: APPLICATION OF PREVENA WOUND VAC;  Surgeon: Newt Minion, MD;  Location: [place];  Service: Orthopedics;  Laterality: Right;  . AV FISTULA PLACEMENT Left [id number]   done at [facility]     2 stent   . CHOLECYSTECTOMY    . CORONARY STENT PLACEMENT    . FOOT ARTHRODESIS Right 08/06/2016   Procedure: Right Foot Fusion Lisfranc Joint;  Surgeon: Newt Minion, MD;  Location: [place];  Service: Orthopedics;  Laterality: Right;  . HARDWARE REMOVAL Left 03/16/2015   Procedure: Removal Hardware Left Ankle;  Surgeon: Newt Minion, MD;  Location: [place];  Service: Orthopedics;  Laterality: Left;  . ORIF ANKLE FRACTURE Left 11/20/2014   Procedure: OPEN REDUCTION INTERNAL FIXATION (ORIF) ANKLE FRACTURE;  Surgeon: Renette Butters, MD;  Location: [place];  Service: Orthopedics;  Laterality: Left;  . TONSILLECTOMY     Social History   Occupational History  . Not  on file.   Social History Main Topics  . Smoking status: Current Every Day Smoker    Packs/day: 1.00    Types: Cigarettes  . Smokeless tobacco: Never Used  . Alcohol use No  . Drug use: No  . Sexual activity: No

## 2017-02-06 DIAGNOSIS — T82858A Stenosis of vascular prosthetic devices, implants and grafts, initial encounter: Secondary | ICD-10-CM | POA: Diagnosis not present

## 2017-02-06 DIAGNOSIS — I871 Compression of vein: Secondary | ICD-10-CM | POA: Diagnosis not present

## 2017-02-06 DIAGNOSIS — Z992 Dependence on renal dialysis: Secondary | ICD-10-CM | POA: Diagnosis not present

## 2017-02-06 DIAGNOSIS — N186 End stage renal disease: Secondary | ICD-10-CM | POA: Diagnosis not present

## 2017-04-07 ENCOUNTER — Encounter: Payer: Self-pay | Admitting: Nephrology

## 2017-05-09 IMAGING — DX DG CHEST 1V PORT
1 series · 1 of 1 positions shown · non-contrast
Comparison: PA and lateral chest x-ray September 08, 2016

CLINICAL DATA: Dyspnea, chest and abdominal pain. History of CHF,
coronary artery disease, stage IV chronic renal insufficiency,
current smoker.

EXAM:
PORTABLE CHEST 1 VIEW

[chest ap]
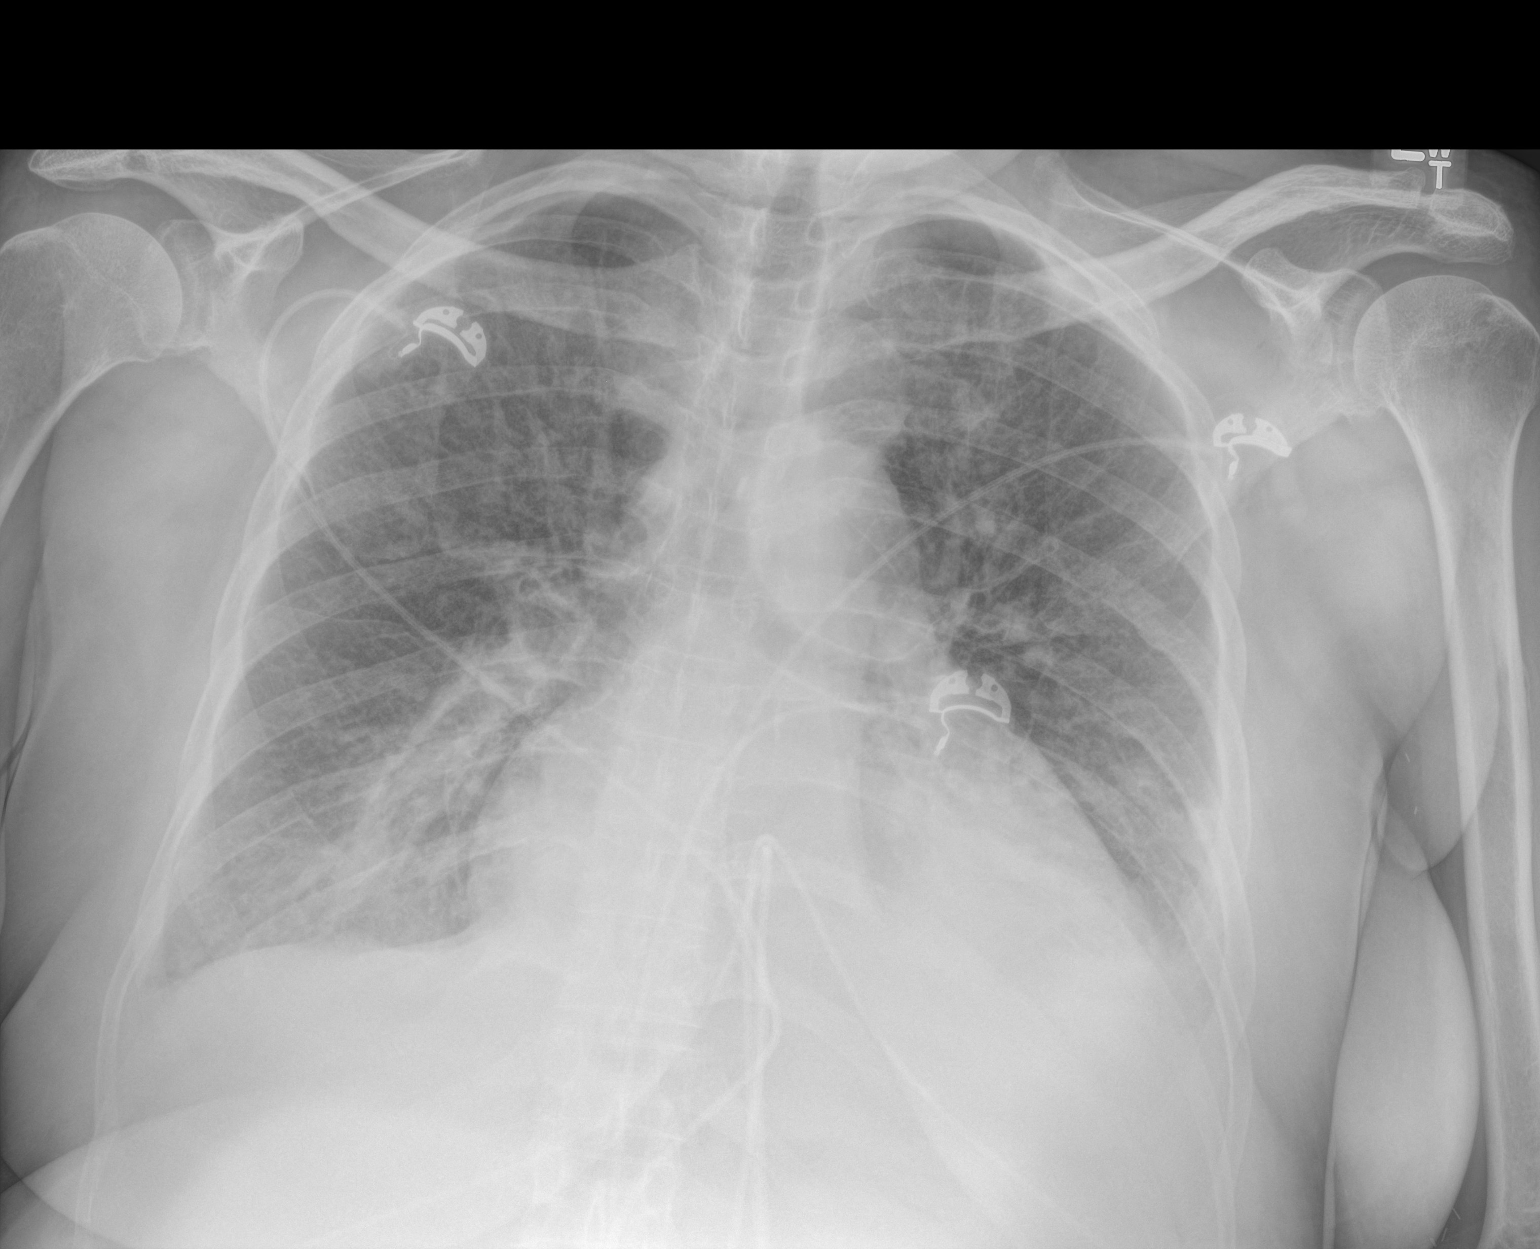

[1 of 1 positions shown; findings below may reference images not displayed]

FINDINGS: The lungs are well-expanded. The interstitial markings are
increased. The pulmonary vascularity is engorged. The cardiac
silhouette is enlarged. There is a small left pleural effusion.
There is increased density in the retrocardiac region on the left
consistent with atelectasis or pneumonia. Subtle increased density
in the right infrahilar region has developed as well. The bony
thorax exhibits no acute abnormality.
IMPRESSION: CHF with mild pulmonary interstitial edema more conspicuous than on
the previous study. Increased density in the infrahilar regions
bilaterally consistent with atelectasis or pneumonia. Small left
pleural effusion.

## 2017-06-11 DIAGNOSIS — L0501 Pilonidal cyst with abscess: Secondary | ICD-10-CM | POA: Diagnosis not present

## 2017-06-25 DIAGNOSIS — Z992 Dependence on renal dialysis: Secondary | ICD-10-CM | POA: Diagnosis not present

## 2017-06-25 DIAGNOSIS — N186 End stage renal disease: Secondary | ICD-10-CM | POA: Diagnosis not present

## 2017-06-25 DIAGNOSIS — E1129 Type 2 diabetes mellitus with other diabetic kidney complication: Secondary | ICD-10-CM | POA: Diagnosis not present

## 2017-07-06 ENCOUNTER — Encounter (INDEPENDENT_AMBULATORY_CARE_PROVIDER_SITE_OTHER): Payer: Self-pay | Admitting: Family

## 2017-07-06 ENCOUNTER — Ambulatory Visit (INDEPENDENT_AMBULATORY_CARE_PROVIDER_SITE_OTHER): Payer: Medicare Other | Admitting: Family

## 2017-07-06 VITALS — Ht 64.0 in | Wt 144.0 lb

## 2017-07-06 DIAGNOSIS — E11610 Type 2 diabetes mellitus with diabetic neuropathic arthropathy: Secondary | ICD-10-CM

## 2017-07-06 DIAGNOSIS — B351 Tinea unguium: Secondary | ICD-10-CM

## 2017-07-06 NOTE — Progress Notes (Signed)
Office Visit Note   Patient: Robin [name]           Date of Birth: 01/04/1951           MRN: [id number] Visit Date: 07/06/2017              Requested by: Center, [facility] [address] [place], [place] [address] PCP: [place]  Chief Complaint  Patient presents with  . Right Foot - Nail Problem  . Left Foot - Nail Problem      HPI: The patient is a 67 year old woman who presents today complaining of bilateral foot evaluation. She requests a nail Trim.  Assessment & Plan: Visit Diagnoses:  1. Onychomycosis   2. Charcot foot due to diabetes mellitus ([place])     Plan: Nails trimmed 10. Recommended compression stockings and elevation helpful as well. She'll follow-up in office in 3 months sooner should she have any concerns in the meantime.  Follow-Up Instructions: Return in about 3 months (around 10/03/2017).   Ortho Exam  Patient is alert, oriented, no adenopathy, well-dressed, normal affect, normal respiratory effort. Well-healed surgical scars to the left foot and right ankle.  Trace edema bilateral feet and ankles. thickened and discolored onychomycotic nails 10. She is unable to safely trim her own nails. Nails 10 without incident.  Imaging: No results found. No images are attached to the encounter.  Labs: Lab Results  Component Value Date   HGBA1C 6.7 (H) 09/09/2016   HGBA1C 7.7 08/25/2016   HGBA1C 8.3 (H) 07/30/2016   LABURIC 5.4 12/01/2009   LABURIC 5.5 11/30/2009   REPTSTATUS 08/13/2016 FINAL 08/07/2016   GRAMSTAIN  03/14/2016    MODERATE WBC PRESENT,BOTH PMN AND MONONUCLEAR NO ORGANISMS SEEN    CULT  08/07/2016    NO GROWTH 5 DAYS Performed at [facility], [place] [address] [place], [place] [address]     Orders:  No orders of the defined types were placed in this encounter.  No orders of the defined types were placed in this encounter.    Procedures: No procedures performed  Clinical Data: No additional  findings.  ROS:  All other systems negative, except as noted in the HPI. Review of Systems  Constitutional: Negative for chills and fever.  Skin: Negative for color change and wound.  Neurological: Negative for weakness.    Objective: Vital Signs: Ht 5\' 4"  (1.626 m)   Wt 144 lb (65.3 kg)   BMI 24.72 kg/m   Specialty Comments:  No specialty comments available.  PMFS History: Patient Active Problem List   Diagnosis Date Noted  . Onychomycosis 10/07/2016  . Diabetic Charct's arthropathy ([place]) 10/07/2016  . Acute on chronic respiratory failure with hypoxia ([place]) 09/09/2016  . GERD (gastroesophageal reflux disease) 09/09/2016  . Depression 09/09/2016  . Acute renal failure superimposed on stage 4 chronic kidney disease ([place]) 09/09/2016  . Chronic diastolic (congestive) heart failure ([place]) 09/09/2016  . Elevated troponin 09/09/2016  . Abdominal pain 09/09/2016  . SIRS (systemic inflammatory response syndrome) ([place]) 08/08/2016  . CHF (congestive heart failure) ([place]) 08/07/2016  . Right leg DVT ([place]) 12/05/2015  . Charcot foot due to diabetes mellitus ([place])   . Hyperlipidemia 03/26/2015  . Charcot ankle 03/16/2015  . Confusion 01/21/2015  . Anemia 01/21/2015  . Multiple falls 01/21/2015  . Anemia, chronic renal failure   . Ventricular tachycardia ([place])   . Chronic renal disease, stage 4, severely decreased glomerular filtration rate between 15-29 mL/min/1.73 square meter (  [place])   . Anemia in chronic kidney disease 12/09/2014  . CKD (chronic kidney disease) 12/09/2014  . Hypertensive heart/renal disease with failure ([place]) 12/09/2014  . Bipolar affective disorder ([place]) 12/09/2014  . Acute encephalopathy   . Acute delirium 11/18/2014  . Type 2 diabetes mellitus with hyperglycemia ([place]) 11/18/2014  . Essential hypertension 11/18/2014  . Chronic combined systolic and diastolic CHF (congestive heart failure) ([place]) 11/18/2014  . Fracture dislocation of ankle 11/17/2014  . DM  type 2, uncontrolled, with renal complications ([place]) 40/34/7425  . Hypokalemia 11/17/2014  . Obesity 11/17/2014  . CAD (coronary artery disease), native coronary artery with 2 stents  11/17/2014   Past Medical History:  Diagnosis Date  . Anemia   . Anxiety   . Arthritis   . Benign hypertension   . Bipolar disorder ([place])   . CHF (congestive heart failure) ([place])   . Chronic kidney disease, stage V [facility])    Nephrologist is with [facility] (Dr. Tammi Klippel)  . Coronary artery disease   . Depression   . Diabetes mellitus without complication ([place])   . DVT (deep venous thrombosis) (HCC)    right lower leg  . GERD (gastroesophageal reflux disease)   . Heart murmur   . History of blood transfusion   . History of bronchitis   . History of pneumonia   . Shortness of breath dyspnea   . Sleep apnea   . Type 2 diabetes mellitus (H[facility])     Family History  Family history unknown: Yes    Past Surgical History:  Procedure Laterality Date  . ABDOMINAL HYSTERECTOMY    . ANKLE CLOSED REDUCTION N/A 11/17/2014   Procedure: CLOSED REDUCTION ANKLE;  Surgeon: Earlie Server, MD;  Location: [facility];  Service: Orthopedics;  Laterality: N/A;  . ANKLE FUSION Left 03/16/2015   Procedure: Left Tibiocalcaneal Fusion;  Surgeon: Newt Minion, MD;  Location: [facility];  Service: Orthopedics;  Laterality: Left;  . APPLICATION OF WOUND VAC Right 08/06/2016   Procedure: APPLICATION OF PREVENA WOUND VAC;  Surgeon: Newt Minion, MD;  Location: [facility];  Service: Orthopedics;  Laterality: Right;  . AV FISTULA PLACEMENT Left [id number]   done at [place]     2 stent   . CHOLECYSTECTOMY    . CORONARY STENT PLACEMENT    . FOOT ARTHRODESIS Right 08/06/2016   Procedure: Right Foot Fusion Lisfranc Joint;  Surgeon: Newt Minion, MD;  Location: [facility];  Service: Orthopedics;  Laterality: Right;  . HARDWARE REMOVAL Left 03/16/2015   Procedure: Removal Hardware Left Ankle;  Surgeon: Newt Minion, MD;   Location: [facility];  Service: Orthopedics;  Laterality: Left;  . ORIF ANKLE FRACTURE Left 11/20/2014   Procedure: OPEN REDUCTION INTERNAL FIXATION (ORIF) ANKLE FRACTURE;  Surgeon: Renette Butters, MD;  Location: [facility];  Service: Orthopedics;  Laterality: Left;  . TONSILLECTOMY     Social History   Occupational History  . Not on file  Tobacco Use  . Smoking status: Current Every Day Smoker    Packs/day: 1.00    Types: Cigarettes  . Smokeless tobacco: Never Used  Substance and Sexual Activity  . Alcohol use: No  . Drug use: No  . Sexual activity: No

## 2017-07-09 IMAGING — CR DG CHEST 2V
2 series · 2 of 2 positions shown · non-contrast
Comparison: Chest radiograph dated 09/11/2016

CLINICAL DATA: 66-year-old female with cough and vomiting.

EXAM:
CHEST  2 VIEW

[w chest lat]
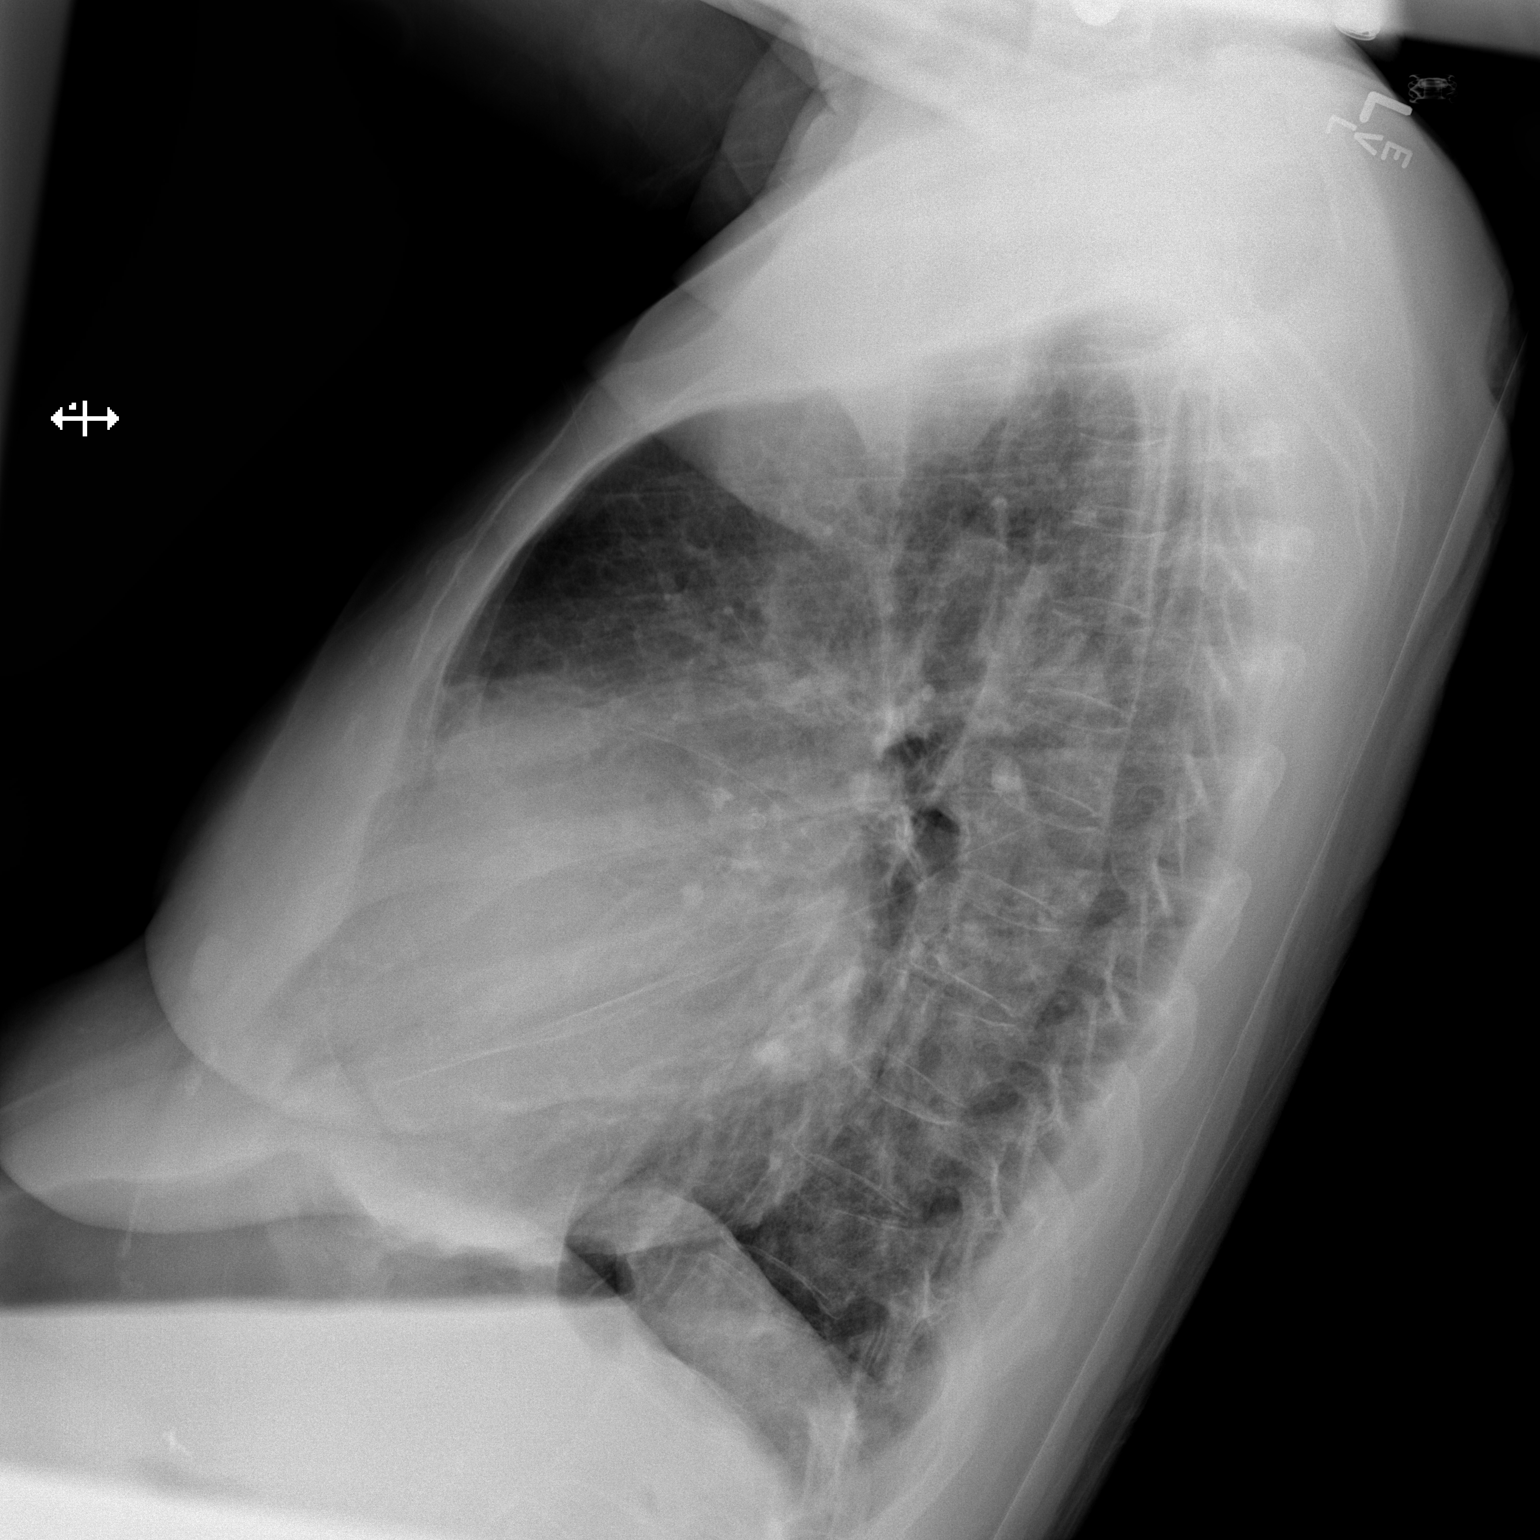

[x chest ap]
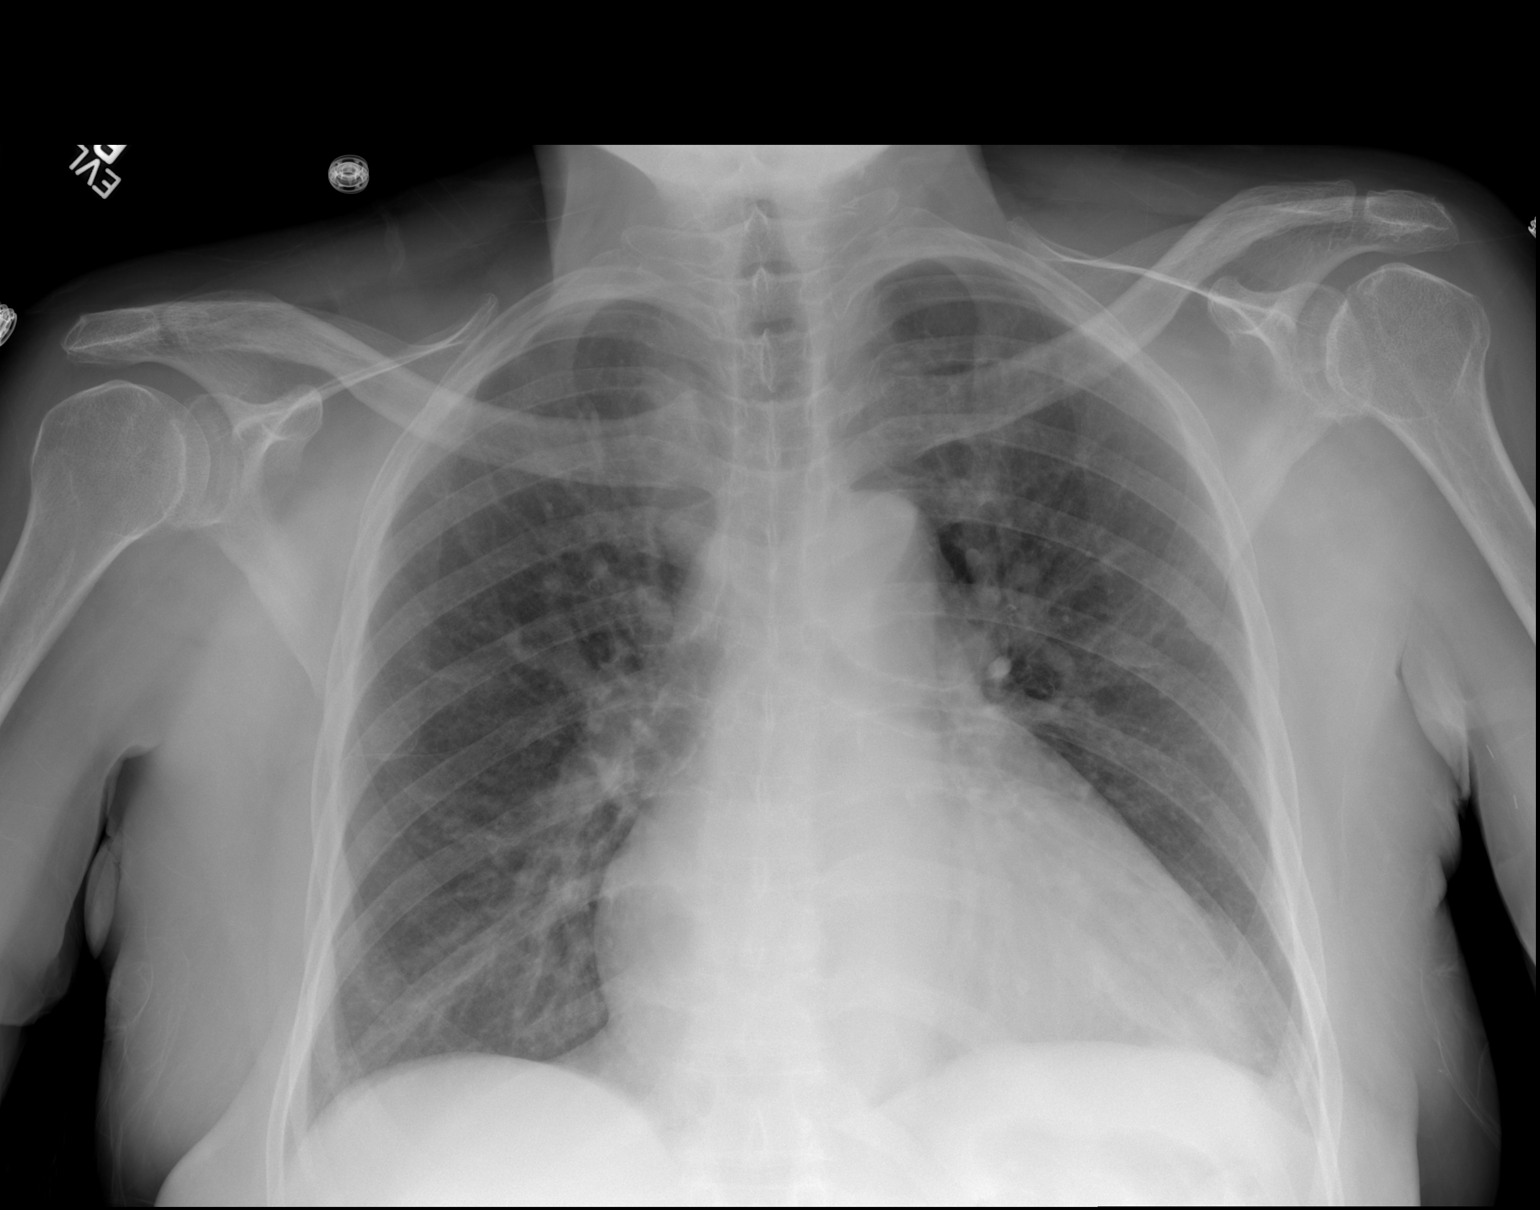

[2 of 2 positions shown; findings below may reference images not displayed]

FINDINGS: There is mild cardiomegaly with mild vascular congestion. No focal
consolidation, pleural effusion, or pneumothorax. No acute osseous
pathology.
IMPRESSION: Mild cardiomegaly with mild vascular congestion. No focal
consolidation.

## 2017-08-05 ENCOUNTER — Telehealth (INDEPENDENT_AMBULATORY_CARE_PROVIDER_SITE_OTHER): Payer: Self-pay | Admitting: Orthopedic Surgery

## 2017-08-05 NOTE — Telephone Encounter (Signed)
error 

## 2017-08-10 ENCOUNTER — Ambulatory Visit (INDEPENDENT_AMBULATORY_CARE_PROVIDER_SITE_OTHER): Payer: Medicare Other | Admitting: Orthopedic Surgery

## 2017-09-22 DIAGNOSIS — E1129 Type 2 diabetes mellitus with other diabetic kidney complication: Secondary | ICD-10-CM | POA: Diagnosis not present

## 2017-09-22 DIAGNOSIS — N186 End stage renal disease: Secondary | ICD-10-CM | POA: Diagnosis not present

## 2017-09-22 DIAGNOSIS — Z992 Dependence on renal dialysis: Secondary | ICD-10-CM | POA: Diagnosis not present

## 2017-10-23 DIAGNOSIS — Z992 Dependence on renal dialysis: Secondary | ICD-10-CM | POA: Diagnosis not present

## 2017-10-23 DIAGNOSIS — N186 End stage renal disease: Secondary | ICD-10-CM | POA: Diagnosis not present

## 2017-10-23 DIAGNOSIS — E1129 Type 2 diabetes mellitus with other diabetic kidney complication: Secondary | ICD-10-CM | POA: Diagnosis not present

## 2017-11-22 DIAGNOSIS — N186 End stage renal disease: Secondary | ICD-10-CM | POA: Diagnosis not present

## 2017-11-22 DIAGNOSIS — Z992 Dependence on renal dialysis: Secondary | ICD-10-CM | POA: Diagnosis not present

## 2017-11-22 DIAGNOSIS — E1129 Type 2 diabetes mellitus with other diabetic kidney complication: Secondary | ICD-10-CM | POA: Diagnosis not present

## 2017-11-25 ENCOUNTER — Ambulatory Visit (INDEPENDENT_AMBULATORY_CARE_PROVIDER_SITE_OTHER): Payer: Medicare Other | Admitting: Family

## 2017-11-25 DIAGNOSIS — B351 Tinea unguium: Secondary | ICD-10-CM | POA: Diagnosis not present

## 2017-11-30 ENCOUNTER — Encounter (INDEPENDENT_AMBULATORY_CARE_PROVIDER_SITE_OTHER): Payer: Self-pay | Admitting: Family

## 2017-11-30 NOTE — Progress Notes (Signed)
Office Visit Note   Patient: Robin [name]           Date of Birth: 04-03-1951           MRN: [id number] Visit Date: 11/25/2017              Requested by: Center, [name] [address] [place], [place] [address] PCP: Center, [place]  Chief Complaint  Patient presents with  . Follow-up    Nail trim      HPI: The patient is a 67 year old woman who presents today in follow up for bilateral foot evaluation. She requests a nail trim.  Assessment & Plan: Visit Diagnoses:  No diagnosis found.  Plan: Nails trimmed 10. Recommended compression stockings and elevation as well. She'll follow-up in office in 3 months, sooner should she have any concerns in the meantime.  Follow-Up Instructions: No follow-ups on file.   Ortho Exam  Patient is alert, oriented, no adenopathy, well-dressed, normal affect, normal respiratory effort. Well-healed surgical scars to the left foot and right ankle.  Trace edema bilateral feet and ankles. Thickened and discolored onychomycotic nails 10. She is unable to safely trim her own nails. Nails 10 without incident.  Imaging: No results found. No images are attached to the encounter.  Labs: Lab Results  Component Value Date   HGBA1C 6.7 (H) 09/09/2016   HGBA1C 7.7 08/25/2016   HGBA1C 8.3 (H) 07/30/2016   LABURIC 5.4 12/01/2009   LABURIC 5.5 11/30/2009   REPTSTATUS 08/13/2016 FINAL 08/07/2016   GRAMSTAIN  03/14/2016    MODERATE WBC PRESENT,BOTH PMN AND MONONUCLEAR NO ORGANISMS SEEN    CULT  08/07/2016    NO GROWTH 5 DAYS Performed at [facility], [place] [address], [place], [place] [address]     Orders:  No orders of the defined types were placed in this encounter.  No orders of the defined types were placed in this encounter.    Procedures: No procedures performed  Clinical Data: No additional findings.  ROS:  All other systems negative, except as noted in the HPI. Review of Systems  Constitutional: Negative  for chills and fever.  Skin: Negative for color change and wound.  Neurological: Negative for weakness.    Objective: Vital Signs: There were no vitals taken for this visit.  Specialty Comments:  No specialty comments available.  PMFS History: Patient Active Problem List   Diagnosis Date Noted  . Onychomycosis 10/07/2016  . Diabetic Charct's arthropathy ([place]) 10/07/2016  . Acute on chronic respiratory failure with hypoxia ([place]) 09/09/2016  . GERD (gastroesophageal reflux disease) 09/09/2016  . Depression 09/09/2016  . Acute renal failure superimposed on stage 4 chronic kidney disease ([place]) 09/09/2016  . Chronic diastolic (congestive) heart failure ([place]) 09/09/2016  . Elevated troponin 09/09/2016  . Abdominal pain 09/09/2016  . SIRS (systemic inflammatory response syndrome) ([place]) 08/08/2016  . CHF (congestive heart failure) ([place]) 08/07/2016  . Right leg DVT ([place]) 12/05/2015  . Charcot foot due to diabetes mellitus ([place])   . Hyperlipidemia 03/26/2015  . Charcot ankle 03/16/2015  . Confusion 01/21/2015  . Anemia 01/21/2015  . Multiple falls 01/21/2015  . Anemia, chronic renal failure   . Ventricular tachycardia ([place])   . Chronic renal disease, stage 4, severely decreased glomerular filtration rate between 15-29 mL/min/1.73 square meter (HCC)   . Anemia in chronic kidney disease 12/09/2014  . CKD (chronic kidney disease) 12/09/2014  . Hypertensive heart/renal disease with failure ([place]) 12/09/2014  . Bipolar affective  disorder ([place]) 12/09/2014  . Acute encephalopathy   . Acute delirium 11/18/2014  . Type 2 diabetes mellitus with hyperglycemia ([place]) 11/18/2014  . Essential hypertension 11/18/2014  . Chronic combined systolic and diastolic CHF (congestive heart failure) ([place]) 11/18/2014  . Fracture dislocation of ankle 11/17/2014  . DM type 2, uncontrolled, with renal complications ([place]) 19/14/7829  . Hypokalemia 11/17/2014  . Obesity 11/17/2014  . CAD (coronary artery  disease), native coronary artery with 2 stents  11/17/2014   Past Medical History:  Diagnosis Date  . Anemia   . Anxiety   . Arthritis   . Benign hypertension   . Bipolar disorder ([place])   . CHF (congestive heart failure) ([place])   . Chronic kidney disease, stage V [facility])    Nephrologist is with [facility] (Dr. Tammi Klippel)  . Coronary artery disease   . Depression   . Diabetes mellitus without complication ([place])   . DVT (deep venous thrombosis) (HCC)    right lower leg  . GERD (gastroesophageal reflux disease)   . Heart murmur   . History of blood transfusion   . History of bronchitis   . History of pneumonia   . Shortness of breath dyspnea   . Sleep apnea   . Type 2 diabetes mellitus (H[facility])     Family History  Family history unknown: Yes    Past Surgical History:  Procedure Laterality Date  . ABDOMINAL HYSTERECTOMY    . ANKLE CLOSED REDUCTION N/A 11/17/2014   Procedure: CLOSED REDUCTION ANKLE;  Surgeon: Earlie Server, MD;  Location: [facility];  Service: Orthopedics;  Laterality: N/A;  . ANKLE FUSION Left 03/16/2015   Procedure: Left Tibiocalcaneal Fusion;  Surgeon: Newt Minion, MD;  Location: [facility];  Service: Orthopedics;  Laterality: Left;  . APPLICATION OF WOUND VAC Right 08/06/2016   Procedure: APPLICATION OF PREVENA WOUND VAC;  Surgeon: Newt Minion, MD;  Location: [facility];  Service: Orthopedics;  Laterality: Right;  . AV FISTULA PLACEMENT Left [id number]   done at [facility]     2 stent   . CHOLECYSTECTOMY    . CORONARY STENT PLACEMENT    . FOOT ARTHRODESIS Right 08/06/2016   Procedure: Right Foot Fusion Lisfranc Joint;  Surgeon: Newt Minion, MD;  Location: [facility];  Service: Orthopedics;  Laterality: Right;  . HARDWARE REMOVAL Left 03/16/2015   Procedure: Removal Hardware Left Ankle;  Surgeon: Newt Minion, MD;  Location: [facility];  Service: Orthopedics;  Laterality: Left;  . ORIF ANKLE FRACTURE Left 11/20/2014   Procedure: OPEN REDUCTION INTERNAL  FIXATION (ORIF) ANKLE FRACTURE;  Surgeon: Renette Butters, MD;  Location: [facility];  Service: Orthopedics;  Laterality: Left;  . TONSILLECTOMY     Social History   Occupational History  . Not on file  Tobacco Use  . Smoking status: Current Every Day Smoker    Packs/day: 1.00    Types: Cigarettes  . Smokeless tobacco: Never Used  Substance and Sexual Activity  . Alcohol use: No  . Drug use: No  . Sexual activity: Never

## 2017-12-11 ENCOUNTER — Ambulatory Visit: Payer: Medicare Other

## 2017-12-11 ENCOUNTER — Encounter: Payer: Medicare Other | Admitting: Podiatry

## 2017-12-15 NOTE — Progress Notes (Signed)
Erroneous encounter, please disregard

## 2017-12-26 ENCOUNTER — Emergency Department (HOSPITAL_COMMUNITY): Payer: No Typology Code available for payment source

## 2017-12-26 ENCOUNTER — Encounter (HOSPITAL_COMMUNITY): Payer: Self-pay

## 2017-12-26 ENCOUNTER — Other Ambulatory Visit: Payer: Self-pay

## 2017-12-26 ENCOUNTER — Inpatient Hospital Stay (HOSPITAL_COMMUNITY)
Admission: EM | Admit: 2017-12-26 | Discharge: 2017-12-29 | DRG: 286 | Disposition: A | Discharge status: Home Health | Payer: No Typology Code available for payment source | Source: Ambulatory Visit | Attending: Internal Medicine | Admitting: Internal Medicine

## 2017-12-26 DIAGNOSIS — I5032 Chronic diastolic (congestive) heart failure: Secondary | ICD-10-CM | POA: Diagnosis present

## 2017-12-26 DIAGNOSIS — Z79899 Other long term (current) drug therapy: Secondary | ICD-10-CM | POA: Diagnosis not present

## 2017-12-26 DIAGNOSIS — I25110 Atherosclerotic heart disease of native coronary artery with unstable angina pectoris: Principal | ICD-10-CM | POA: Diagnosis present

## 2017-12-26 DIAGNOSIS — I130 Hypertensive heart and chronic kidney disease with heart failure and stage 1 through stage 4 chronic kidney disease, or unspecified chronic kidney disease: Secondary | ICD-10-CM | POA: Diagnosis not present

## 2017-12-26 DIAGNOSIS — E8889 Other specified metabolic disorders: Secondary | ICD-10-CM | POA: Diagnosis present

## 2017-12-26 DIAGNOSIS — D509 Iron deficiency anemia, unspecified: Secondary | ICD-10-CM | POA: Diagnosis not present

## 2017-12-26 DIAGNOSIS — O1002 Pre-existing essential hypertension complicating childbirth: Secondary | ICD-10-CM

## 2017-12-26 DIAGNOSIS — E785 Hyperlipidemia, unspecified: Secondary | ICD-10-CM | POA: Diagnosis present

## 2017-12-26 DIAGNOSIS — D539 Nutritional anemia, unspecified: Secondary | ICD-10-CM | POA: Diagnosis not present

## 2017-12-26 DIAGNOSIS — N186 End stage renal disease: Secondary | ICD-10-CM | POA: Diagnosis present

## 2017-12-26 DIAGNOSIS — Z7982 Long term (current) use of aspirin: Secondary | ICD-10-CM | POA: Diagnosis not present

## 2017-12-26 DIAGNOSIS — Z992 Dependence on renal dialysis: Secondary | ICD-10-CM

## 2017-12-26 DIAGNOSIS — G252 Other specified forms of tremor: Secondary | ICD-10-CM

## 2017-12-26 DIAGNOSIS — I493 Ventricular premature depolarization: Secondary | ICD-10-CM | POA: Diagnosis not present

## 2017-12-26 DIAGNOSIS — I25118 Atherosclerotic heart disease of native coronary artery with other forms of angina pectoris: Secondary | ICD-10-CM | POA: Diagnosis not present

## 2017-12-26 DIAGNOSIS — I2584 Coronary atherosclerosis due to calcified coronary lesion: Secondary | ICD-10-CM | POA: Diagnosis not present

## 2017-12-26 DIAGNOSIS — E11610 Type 2 diabetes mellitus with diabetic neuropathic arthropathy: Secondary | ICD-10-CM | POA: Diagnosis present

## 2017-12-26 DIAGNOSIS — M79622 Pain in left upper arm: Secondary | ICD-10-CM | POA: Diagnosis not present

## 2017-12-26 DIAGNOSIS — Z833 Family history of diabetes mellitus: Secondary | ICD-10-CM

## 2017-12-26 DIAGNOSIS — I200 Unstable angina: Secondary | ICD-10-CM

## 2017-12-26 DIAGNOSIS — I4581 Long QT syndrome: Secondary | ICD-10-CM

## 2017-12-26 DIAGNOSIS — E1122 Type 2 diabetes mellitus with diabetic chronic kidney disease: Secondary | ICD-10-CM | POA: Diagnosis present

## 2017-12-26 DIAGNOSIS — I700 Atherosclerosis of aorta: Secondary | ICD-10-CM | POA: Diagnosis present

## 2017-12-26 DIAGNOSIS — G4733 Obstructive sleep apnea (adult) (pediatric): Secondary | ICD-10-CM | POA: Diagnosis present

## 2017-12-26 DIAGNOSIS — E1165 Type 2 diabetes mellitus with hyperglycemia: Secondary | ICD-10-CM | POA: Diagnosis not present

## 2017-12-26 DIAGNOSIS — R1084 Generalized abdominal pain: Secondary | ICD-10-CM | POA: Diagnosis not present

## 2017-12-26 DIAGNOSIS — I2510 Atherosclerotic heart disease of native coronary artery without angina pectoris: Secondary | ICD-10-CM

## 2017-12-26 DIAGNOSIS — I10 Essential (primary) hypertension: Secondary | ICD-10-CM | POA: Diagnosis not present

## 2017-12-26 DIAGNOSIS — I120 Hypertensive chronic kidney disease with stage 5 chronic kidney disease or end stage renal disease: Secondary | ICD-10-CM | POA: Diagnosis not present

## 2017-12-26 DIAGNOSIS — R109 Unspecified abdominal pain: Secondary | ICD-10-CM | POA: Diagnosis present

## 2017-12-26 DIAGNOSIS — D631 Anemia in chronic kidney disease: Secondary | ICD-10-CM | POA: Diagnosis present

## 2017-12-26 DIAGNOSIS — I959 Hypotension, unspecified: Secondary | ICD-10-CM | POA: Diagnosis not present

## 2017-12-26 DIAGNOSIS — Z955 Presence of coronary angioplasty implant and graft: Secondary | ICD-10-CM

## 2017-12-26 DIAGNOSIS — I132 Hypertensive heart and chronic kidney disease with heart failure and with stage 5 chronic kidney disease, or end stage renal disease: Secondary | ICD-10-CM | POA: Diagnosis not present

## 2017-12-26 DIAGNOSIS — M79602 Pain in left arm: Secondary | ICD-10-CM

## 2017-12-26 DIAGNOSIS — Z794 Long term (current) use of insulin: Secondary | ICD-10-CM

## 2017-12-26 DIAGNOSIS — Z8249 Family history of ischemic heart disease and other diseases of the circulatory system: Secondary | ICD-10-CM

## 2017-12-26 DIAGNOSIS — I509 Heart failure, unspecified: Secondary | ICD-10-CM | POA: Diagnosis not present

## 2017-12-26 DIAGNOSIS — K219 Gastro-esophageal reflux disease without esophagitis: Secondary | ICD-10-CM | POA: Diagnosis not present

## 2017-12-26 DIAGNOSIS — N184 Chronic kidney disease, stage 4 (severe): Secondary | ICD-10-CM | POA: Diagnosis not present

## 2017-12-26 DIAGNOSIS — R0789 Other chest pain: Secondary | ICD-10-CM

## 2017-12-26 DIAGNOSIS — F319 Bipolar disorder, unspecified: Secondary | ICD-10-CM | POA: Diagnosis present

## 2017-12-26 DIAGNOSIS — M79609 Pain in unspecified limb: Secondary | ICD-10-CM | POA: Diagnosis not present

## 2017-12-26 DIAGNOSIS — R0902 Hypoxemia: Secondary | ICD-10-CM | POA: Diagnosis not present

## 2017-12-26 DIAGNOSIS — E119 Type 2 diabetes mellitus without complications: Secondary | ICD-10-CM

## 2017-12-26 DIAGNOSIS — N179 Acute kidney failure, unspecified: Secondary | ICD-10-CM

## 2017-12-26 DIAGNOSIS — I214 Non-ST elevation (NSTEMI) myocardial infarction: Secondary | ICD-10-CM | POA: Diagnosis not present

## 2017-12-26 DIAGNOSIS — Z981 Arthrodesis status: Secondary | ICD-10-CM | POA: Diagnosis not present

## 2017-12-26 DIAGNOSIS — Z9861 Coronary angioplasty status: Secondary | ICD-10-CM

## 2017-12-26 DIAGNOSIS — N2581 Secondary hyperparathyroidism of renal origin: Secondary | ICD-10-CM | POA: Diagnosis not present

## 2017-12-26 DIAGNOSIS — F17210 Nicotine dependence, cigarettes, uncomplicated: Secondary | ICD-10-CM

## 2017-12-26 HISTORY — DX: Unstable angina: I20.0

## 2017-12-26 LAB — CBC
HCT: 34.9 % — ABNORMAL LOW (ref 36.0–46.0)
Hemoglobin: 11.3 g/dL — ABNORMAL LOW (ref 12.0–15.0)
MCH: 34.7 pg — ABNORMAL HIGH (ref 26.0–34.0)
MCHC: 32.4 g/dL (ref 30.0–36.0)
MCV: 107.1 fL — ABNORMAL HIGH (ref 78.0–100.0)
Platelets: 218 10*3/uL (ref 150–400)
RBC: 3.26 MIL/uL — ABNORMAL LOW (ref 3.87–5.11)
RDW: 15.1 % (ref 11.5–15.5)
WBC: 13 10*3/uL — ABNORMAL HIGH (ref 4.0–10.5)

## 2017-12-26 LAB — BASIC METABOLIC PANEL
Anion gap: 14 (ref 5–15)
BUN: 24 mg/dL — ABNORMAL HIGH (ref 8–23)
CO2: 31 mmol/L (ref 22–32)
Calcium: 9.2 mg/dL (ref 8.9–10.3)
Chloride: 90 mmol/L — ABNORMAL LOW (ref 98–111)
Creatinine, Ser: 4.2 mg/dL — ABNORMAL HIGH (ref 0.44–1.00)
GFR calc Af Amer: 12 mL/min — ABNORMAL LOW (ref >60)
GFR calc non Af Amer: 10 mL/min — ABNORMAL LOW (ref >60)
Glucose, Bld: 331 mg/dL — ABNORMAL HIGH (ref 70–99)
Potassium: 4.6 mmol/L (ref 3.5–5.1)
Sodium: 135 mmol/L (ref 135–145)

## 2017-12-26 LAB — GLUCOSE, CAPILLARY
Glucose-Capillary: 166 mg/dL — ABNORMAL HIGH (ref 70–99)
Glucose-Capillary: 294 mg/dL — ABNORMAL HIGH (ref 70–99)

## 2017-12-26 LAB — HEPARIN LEVEL (UNFRACTIONATED): HEPARIN UNFRACTIONATED: 0.26 [IU]/mL — AB (ref 0.30–0.70)

## 2017-12-26 LAB — I-STAT TROPONIN, ED: Troponin i, poc: 0.01 ng/mL (ref 0.00–0.08)

## 2017-12-26 LAB — TROPONIN I: Troponin I: 0.04 ng/mL (ref <0.03)

## 2017-12-26 LAB — CBG MONITORING, ED: Glucose-Capillary: 299 mg/dL — ABNORMAL HIGH (ref 70–99)

## 2017-12-26 MED ORDER — INSULIN ASPART 100 UNIT/ML ~~LOC~~ SOLN
0.0000 [IU] | Freq: Three times a day (TID) | SUBCUTANEOUS | Status: DC
  Administered 2017-12-27: 3 [IU] via SUBCUTANEOUS
  Administered 2017-12-27: 1 [IU] via SUBCUTANEOUS
  Administered 2017-12-27: 5 [IU] via SUBCUTANEOUS
  Administered 2017-12-28: 2 [IU] via SUBCUTANEOUS

## 2017-12-26 MED ORDER — RAMELTEON 8 MG PO TABS
8.0000 mg | ORAL_TABLET | Freq: Every day | ORAL | Status: DC
  Administered 2017-12-27 – 2017-12-28 (×3): 8 mg via ORAL
  Filled 2017-12-26 (×4): qty 1

## 2017-12-26 MED ORDER — GABAPENTIN 100 MG PO CAPS: 100.0000 mg | ORAL_CAPSULE | ORAL | Status: DC

## 2017-12-26 MED ORDER — HEPARIN BOLUS VIA INFUSION
3900.0000 [IU] | Freq: Once | INTRAVENOUS | Status: AC
  Administered 2017-12-26: 3900 [IU] via INTRAVENOUS
  Filled 2017-12-26: qty 3900

## 2017-12-26 MED ORDER — INSULIN ASPART 100 UNIT/ML ~~LOC~~ SOLN
0.0000 [IU] | Freq: Every day | SUBCUTANEOUS | Status: DC
  Administered 2017-12-26: 3 [IU] via SUBCUTANEOUS
  Administered 2017-12-27: 2 [IU] via SUBCUTANEOUS
  Administered 2017-12-28: 3 [IU] via SUBCUTANEOUS

## 2017-12-26 MED ORDER — HEPARIN (PORCINE) IN NACL 100-0.45 UNIT/ML-% IJ SOLN
1050.0000 [IU]/h | INTRAMUSCULAR | Status: DC
  Administered 2017-12-26: 800 [IU]/h via INTRAVENOUS
  Administered 2017-12-27: 900 [IU]/h via INTRAVENOUS
  Administered 2017-12-28: 1050 [IU]/h via INTRAVENOUS
  Filled 2017-12-26 (×3): qty 250

## 2017-12-26 MED ORDER — HYDRALAZINE HCL 25 MG PO TABS
25.0000 mg | ORAL_TABLET | Freq: Every day | ORAL | Status: DC
  Administered 2017-12-27 – 2017-12-29 (×3): 25 mg via ORAL
  Filled 2017-12-26 (×3): qty 1

## 2017-12-26 MED ORDER — ATORVASTATIN CALCIUM 20 MG PO TABS
20.0000 mg | ORAL_TABLET | Freq: Every day | ORAL | Status: DC
Start: 2017-12-26 — End: 2017-12-29
  Administered 2017-12-26 – 2017-12-28 (×3): 20 mg via ORAL
  Filled 2017-12-26 (×3): qty 1

## 2017-12-26 MED ORDER — DOCUSATE SODIUM 100 MG PO CAPS
100.0000 mg | ORAL_CAPSULE | Freq: Every day | ORAL | Status: DC
  Administered 2017-12-27 – 2017-12-29 (×2): 100 mg via ORAL
  Filled 2017-12-26 (×2): qty 1

## 2017-12-26 MED ORDER — ASPIRIN 81 MG PO CHEW
324.0000 mg | CHEWABLE_TABLET | Freq: Once | ORAL | Status: AC
  Administered 2017-12-26: 324 mg via ORAL
  Filled 2017-12-26: qty 4

## 2017-12-26 MED ORDER — ACETAMINOPHEN 325 MG PO TABS
650.0000 mg | ORAL_TABLET | Freq: Four times a day (QID) | ORAL | Status: DC | PRN
  Administered 2017-12-28: 650 mg via ORAL
  Filled 2017-12-26: qty 2

## 2017-12-26 MED ORDER — POLYETHYLENE GLYCOL 3350 17 G PO PACK: 17.0000 g | PACK | Freq: Every day | ORAL | Status: DC | PRN

## 2017-12-26 MED ORDER — INSULIN DETEMIR 100 UNIT/ML ~~LOC~~ SOLN
6.0000 [IU] | Freq: Every day | SUBCUTANEOUS | Status: DC
  Administered 2017-12-27 – 2017-12-29 (×3): 6 [IU] via SUBCUTANEOUS
  Filled 2017-12-26 (×4): qty 0.06

## 2017-12-26 MED ORDER — ACETAMINOPHEN 650 MG RE SUPP: 650.0000 mg | Freq: Four times a day (QID) | RECTAL | Status: DC | PRN

## 2017-12-26 MED ORDER — SEVELAMER CARBONATE 800 MG PO TABS
1600.0000 mg | ORAL_TABLET | Freq: Three times a day (TID) | ORAL | Status: DC
  Administered 2017-12-26 – 2017-12-29 (×6): 1600 mg via ORAL
  Filled 2017-12-26 (×5): qty 2

## 2017-12-26 MED ORDER — ASPIRIN 81 MG PO CHEW
81.0000 mg | CHEWABLE_TABLET | Freq: Every day | ORAL | Status: DC
  Administered 2017-12-27 – 2017-12-29 (×3): 81 mg via ORAL
  Filled 2017-12-26 (×3): qty 1

## 2017-12-26 MED ORDER — ISOSORBIDE MONONITRATE ER 60 MG PO TB24
60.0000 mg | ORAL_TABLET | Freq: Every day | ORAL | Status: DC
  Administered 2017-12-27: 60 mg via ORAL
  Filled 2017-12-26: qty 1

## 2017-12-26 MED ORDER — GABAPENTIN 100 MG PO CAPS
100.0000 mg | ORAL_CAPSULE | Freq: Every day | ORAL | Status: DC
Start: 2017-12-27 — End: 2017-12-29
  Administered 2017-12-27 – 2017-12-29 (×3): 100 mg via ORAL
  Filled 2017-12-26 (×3): qty 1

## 2017-12-26 MED ORDER — GABAPENTIN 100 MG PO CAPS
200.0000 mg | ORAL_CAPSULE | Freq: Every day | ORAL | Status: DC
Start: 2017-12-26 — End: 2017-12-29
  Administered 2017-12-26 – 2017-12-28 (×3): 200 mg via ORAL
  Filled 2017-12-26 (×3): qty 2

## 2017-12-26 MED ORDER — PANTOPRAZOLE SODIUM 20 MG PO TBEC
20.0000 mg | DELAYED_RELEASE_TABLET | Freq: Every day | ORAL | Status: DC
  Administered 2017-12-27 – 2017-12-29 (×3): 20 mg via ORAL
  Filled 2017-12-26 (×3): qty 1

## 2017-12-26 MED ORDER — FENTANYL CITRATE (PF) 100 MCG/2ML IJ SOLN
75.0000 ug | Freq: Once | INTRAMUSCULAR | Status: AC
  Administered 2017-12-26: 75 ug via INTRAVENOUS
  Filled 2017-12-26: qty 2

## 2017-12-26 MED ORDER — METOPROLOL TARTRATE 50 MG PO TABS
50.0000 mg | ORAL_TABLET | Freq: Two times a day (BID) | ORAL | Status: DC
  Administered 2017-12-26 – 2017-12-29 (×6): 50 mg via ORAL
  Filled 2017-12-26 (×6): qty 1

## 2017-12-26 MED ORDER — SENNOSIDES-DOCUSATE SODIUM 8.6-50 MG PO TABS: 2.0000 | ORAL_TABLET | Freq: Two times a day (BID) | ORAL | Status: DC | PRN

## 2017-12-26 MED ORDER — ARIPIPRAZOLE 5 MG PO TABS
7.5000 mg | ORAL_TABLET | Freq: Every day | ORAL | Status: DC
  Administered 2017-12-27 – 2017-12-29 (×3): 7.5 mg via ORAL
  Filled 2017-12-26 (×3): qty 2

## 2017-12-26 NOTE — H&P (Addendum)
Date: 12/26/2017               Patient Name:  Robin [name] MRN: [id number]  DOB: 01/27/1951 Age / Sex: 67 y.o., female   PCP: [facility] Service: Internal Medicine Teaching Service         Attending Physician: Dr. Oval Linsey, MD    First Contact: Dr. Sherry Ruffing Pager: [phone]  Second Contact: Dr. Hetty Ely Pager: [phone]       After Hours (After 5p/  First Contact Pager: [phone]  weekends / holidays): Second Contact Pager: [phone]   Chief Complaint: Chest pain  History of Present Illness:  This is a 67 year old female with a PMH of CHF (EF in 2011 showed 35-40%, last echo in 3/18 showed EF 50-55% and grade 2 diastolic dysfunction), CAD (possible previously placed stent in ramus prozimal/ostial vessel that was patent) CKD stage 4, HTN, DM, GERD, and bipolar disorder presenting with acute chest pain.  Daughter is present and helps with patient history.  She has been having chest pain for years but have has noticed that it has been getting worse in the last 3 to 4 months.  She reports that his left chest, is constant, and feels like a "toothache in the chest".  She has used nitro and reports that this helped relieve the chest pain but not left arm pain.  Does not report it is worse with exertion.  She goes to dialysis on Tuesday Thursdays and Saturdays.  She often will become hypotensive and develop chest pain and nausea.  Today she was getting dialysis and then she started having left sided chest pain, arm tingling and abdominal discomfort.  She only completed 2 hours of her dialysis. Of note she does have a dialysis graft in her left arm and has had issues with blood clots in the past.  She had a stent placed in the left in October 2018. She also has been having nausea, SOB, and left arm pain that occurs at different times that may occur with or without the chest pain. She does notice that most of her symptoms occur when she is getting dialysis. She also reports  that she gets abdominal pain that is worse when she eats and when she is at dialysis.   In the ED she was found to be afebrile, RR of 18, HR 84 and BP 112/69. Labs notable for WBC 13, Hgb 11.3, MCV 107.1, glucose 331, BUN 24, and Cr 4.2. Cr increased from baseline around 2.5. Initial troponin was 0.01. CXR showed no acute cardiopulmonary findings. EKG showed diffuse ST segment depression with T wave inversion. She was given ASA 324 mg and started on a heparin drip. Cardiology evaluated and will plan for catheterization on Monday unless there is a significant elevation in troponin or recurrence of chest pain. Patient was admitted to internal medicine.    Meds:  Scheduled Meds: Continuous Infusions: . heparin 800 Units/hr (12/26/17 1423)   PRN Meds:.  Current Meds  Medication Sig  . ammonium lactate (LAC-HYDRIN) 12 % lotion Apply 1 application topically 2 (two) times daily as needed (for itching/irritation).   . ARIPiprazole (ABILIFY) 15 MG tablet Take 7.5 mg by mouth daily.   Marland Kitchen atorvastatin (LIPITOR) 40 MG tablet Take 20 mg by mouth at bedtime.   Marland Kitchen dextrose (GLUTOSE) 40 % GEL Take 1 Tube by mouth once as needed (for blood sugar less than 60).   Marland Kitchen  docusate sodium (COLACE) 100 MG capsule Take 100 mg by mouth daily.   Marland Kitchen gabapentin (NEURONTIN) 100 MG capsule Take 100-200 mg by mouth See admin instructions. Take 100mg   (one capsule) in the morning and 200mg  (2 capsules) at night  . hydrALAZINE (APRESOLINE) 50 MG tablet Take 25 mg by mouth daily.   . insulin lispro (HUMALOG) 100 UNIT/ML injection Inject 3-15 Units into the skin 3 (three) times daily before meals. Pt uses as needed per sliding scale:    150-200:  3  units 201-250:  5 units 251-300:  8 units 301-350:  10 units 351-400:  12 units and call MD 400+ 15 units  . isosorbide mononitrate (IMDUR) 60 MG 24 hr tablet Take 60 mg by mouth daily.   Marland Kitchen lidocaine (LMX) 4 % cream Apply 1 application topically 3 (three) times daily.  . Melatonin  5 MG TABS Take 10 mg by mouth at bedtime as needed (for sleep).   . metoprolol tartrate (LOPRESSOR) 50 MG tablet Take 50 mg by mouth 2 (two) times daily. Patient unsure of dose  . multivitamin (RENA-VIT) TABS tablet Take 1 tablet by mouth daily.  . nitroGLYCERIN (NITROSTAT) 0.4 MG SL tablet Place 0.4 mg under the tongue every 5 (five) minutes as needed for chest pain.  . pantoprazole (PROTONIX) 20 MG tablet Take 20 mg by mouth daily.   . polyethylene glycol (MIRALAX / GLYCOLAX) packet Take 17 g by mouth daily as needed for mild constipation.   . sennosides-docusate sodium (SENOKOT-S) 8.6-50 MG tablet Take 2 tablets by mouth 2 (two) times daily as needed for constipation.   . sevelamer carbonate (RENVELA) 800 MG tablet Take 1,600 mg by mouth 3 (three) times daily with meals.     Allergies: Allergies as of 12/26/2017  . (No Known Allergies)   Past Medical History:  Diagnosis Date  . Anemia   . Anxiety   . Arthritis   . Benign hypertension   . Bipolar disorder ([place])   . CHF (congestive heart failure) ([place])   . Chronic kidney disease, stage V [facility])    Nephrologist is with [facility] (Dr. Tammi Klippel)  . Coronary artery disease   . Depression   . Diabetes mellitus without complication ([place])   . DVT (deep venous thrombosis) (H[facility])    right lower leg  . GERD (gastroesophageal reflux disease)   . Heart murmur   . History of blood transfusion   . History of bronchitis   . History of pneumonia   . Shortness of breath dyspnea   . Sleep apnea   . Type 2 diabetes mellitus ([place])     Family History: Heart disease, diabetes, cramps in the family.  Sister has history of brain cancer.  Social History: Smokes 18 cigarettes a day, used to smoke 1 pack/day for 30 years.  Denies alcohol or drug use.  Lives alone at home but has children that come and help. 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   Review of Systems: A complete ROS was negative except as per HPI.   Physical Exam: Blood pressure  133/72, pulse (!) 103, temperature 97.8 F (36.6 C), temperature source Oral, resp. rate 13, height 5\' 4"  (1.626 m), weight 144 lb (65.3 kg), SpO2 95 %. General: alert, appears stated age and no distress HEENT: PERRLA and neck supple with midline trachea Heart: S1, S2 normal, no murmur, rub or gallop, regular rate and rhythm, tender to palpation on left upper chest Lungs: clear to auscultation, no wheezes or  rales and unlabored breathing Abdomen: moderate tenderness in the in the epigastrium., no rebound tenderness, no guarding or rigidity Extremities:No LE edema, dialysis graft in left upper arm with thrills Musculoskeletal: no joint tenderness, deformity or swelling Skin:no rashes, no wounds Neurology: normal without focal findings, mental status, speech normal, alert and oriented x3 and PERLA, resting tremor in right arm, no cog wheeling Psychiatry: Normal mood and affect  EKG: personally reviewed my interpretation is rate 84 bpm, normal sinus rhythm, ST depression in leads II, III, and aVF and V4 and V5.    CXR: personally reviewed my interpretation is AP view, poor inspiration, borderline cardiomegaly, increased hilar vasculature, clear diaphragmatic angles  Assessment & Plan by Problem: This is a 67 year old female with a PMH of CHF (EF in 2011 showed 35-40%, last echo in 3/18 showed EF 50-55% and grade 2 diastolic dysfunction), CKD stage 4, HTN, DM, GERD, and bipolar disorder presenting with acute chest pain. Admitted for unstable angina.   Chest pain: Patient presents with atypical angina that is not worsened with exertion or relieved by rest but his relief with nitroglycerin.  She has been having this for couple years now and it has been increasing in frequency over the past couple months.  Associated with diaphoresis, shortness of breath, and left arm pain that can occur with or without the pain. She also reports abdominal pain that get worse with eating and when on dialysis which is  concerning for a coronary steal syndrome. She does not seem to complete her whole dialysis courses. She was found to be hemodynamically stable. On exam she was found to had epigastric tenderness, and tenderness in the left upper chest to palpation. EKG showed ST depression in leads II, III, and aVF. This is likely multifactorial in nature, possibly related to GERD vs ACS vs musculoskeletal. There is more of a concern for MSK and GERD symptoms given the patients atypical chest pain presentation. -Telemetry  -Cardiology following: plan for catherterization on Monday -Heparin drip -ASA 81mg  daily -Troponin negative x 1, trend 2x more -Pantoprazole   Macrocytic anemia: Hgb 11.3, MCV 107.1. Patient is near her baseline Hgb. Will continue to monitor.   Acute on chronic kidney disease: ESRD. Patient is on hemodialysis Tuesday, Thursday and Saturday.. On admission BUN 24 and Cr 4.2. Cr increased from baseline which is around 2.5. She was euvolemic on exam. She only got 2 hours of her dialysis today due to the chest pain so this is likely due to not completing her dialysis. -Repeat BMP in AM  Heart failure: EF in 2011 showed 35-40%, last echo in 3/18 showed EF 50-55% and grade 2 diastolic dysfunction.  -On hydralazine, lipitor, metoprolol, and imdur at home -Continue home medications  HTN: Patient was normotensive on admission. She is on metoprolol, and hydralazine at home.  -Continue home medications  Diabetes mellitus type 2: Patients glucose on admission was 331. She is on humalog TID with meals, and levemir 25 in AM and 40 in PM at home.  -SSI-sensitive -Levemir 6 units -CBG q4 hour  Bipolar disorder: Is on aripiprazole at home. Continue home medications. GERD: Patient is on omeprazole at home. Continue pantoprazole.  FEN: No fluids, replete lytes prn, Renal diet or  VTE ppx: Heparin Code Status: FULL    Dispo: Admit patient to Inpatient with expected length of stay greater than 2  midnights.  Signed: Asencion Noble, MD 12/26/2017, 2:31 PM  Pager: [phone]

## 2017-12-26 NOTE — ED Notes (Signed)
Cardiology at bedside.

## 2017-12-26 NOTE — ED Triage Notes (Signed)
Pt to ED from dialysis - with c/o mid back pain left sided radiating to front- left chest -- had 1/2 of treatment completed. 2L removed per pt.  States pain started during treatment, no change with breathing/movement

## 2017-12-26 NOTE — ED Provider Notes (Signed)
[facility] EMERGENCY DEPARTMENT Provider Note   CSN: [id number] Arrival date & time: 12/26/17  1137     History   Chief Complaint Chief Complaint  Patient presents with  . Chest Pain  . Back Pain    HPI Robin [name] is a 67 y.o. female.  HPI   67 year old female with chest pain.  Symptom onset shortly before arrival while at dialysis.  She stopped dialysis session after 2 hours because she was feeling very nauseated.  She got up to go outside when she began having severe pressure in the left chest radiating into her left upper extremity.  Associated with dyspnea and diaphoresis.  The ache subsided into numbness in her hand and forearm.  Symptoms have now since pretty much resolved aside from crampy sensation in her epigastric region.  On further discussion, she reports that she is actually been having similar although milder symptoms for the past week or so.  She does have a past history of CAD with previous heart catheterization although she is unsure of her cardiac history beyond this.  Past Medical History:  Diagnosis Date  . Anemia   . Anxiety   . Arthritis   . Benign hypertension   . Bipolar disorder ([place])   . CHF (congestive heart failure) ([place])   . Chronic kidney disease, stage V [facility])    Nephrologist is with [facility] (Dr. Tammi Klippel)  . Coronary artery disease   . Depression   . Diabetes mellitus without complication ([place])   . DVT (deep venous thrombosis) (HCC)    right lower leg  . GERD (gastroesophageal reflux disease)   . Heart murmur   . History of blood transfusion   . History of bronchitis   . History of pneumonia   . Shortness of breath dyspnea   . Sleep apnea   . Type 2 diabetes mellitus [facility])     Patient Active Problem List   Diagnosis Date Noted  . Onychomycosis 10/07/2016  . Diabetic Charct's arthropathy ([place]) 10/07/2016  . Acute on chronic respiratory failure with hypoxia ([place]) 09/09/2016  . GERD (gastroesophageal reflux  disease) 09/09/2016  . Depression 09/09/2016  . Acute renal failure superimposed on stage 4 chronic kidney disease ([place]) 09/09/2016  . Chronic diastolic (congestive) heart failure ([place]) 09/09/2016  . Elevated troponin 09/09/2016  . Abdominal pain 09/09/2016  . SIRS (systemic inflammatory response syndrome) ([place]) 08/08/2016  . CHF (congestive heart failure) ([place]) 08/07/2016  . Right leg DVT ([place]) 12/05/2015  . Charcot foot due to diabetes mellitus ([place])   . Hyperlipidemia 03/26/2015  . Charcot ankle 03/16/2015  . Confusion 01/21/2015  . Anemia 01/21/2015  . Multiple falls 01/21/2015  . Anemia, chronic renal failure   . Ventricular tachycardia ([place])   . Chronic renal disease, stage 4, severely decreased glomerular filtration rate between 15-29 mL/min/1.73 square meter (HCC)   . Anemia in chronic kidney disease 12/09/2014  . CKD (chronic kidney disease) 12/09/2014  . Hypertensive heart/renal disease with failure ([place]) 12/09/2014  . Bipolar affective disorder ([place]) 12/09/2014  . Acute encephalopathy   . Acute delirium 11/18/2014  . Type 2 diabetes mellitus with hyperglycemia ([place]) 11/18/2014  . Essential hypertension 11/18/2014  . Chronic combined systolic and diastolic CHF (congestive heart failure) ([place]) 11/18/2014  . Fracture dislocation of ankle 11/17/2014  . DM type 2, uncontrolled, with renal complications ([place]) 41/28/7867  . Hypokalemia 11/17/2014  . Obesity 11/17/2014  . CAD (coronary artery disease), native coronary artery with 2 stents  11/17/2014  Past Surgical History:  Procedure Laterality Date  . ABDOMINAL HYSTERECTOMY    . ANKLE CLOSED REDUCTION N/A 11/17/2014   Procedure: CLOSED REDUCTION ANKLE;  Surgeon: Earlie Server, MD;  Location: [facility];  Service: Orthopedics;  Laterality: N/A;  . ANKLE FUSION Left 03/16/2015   Procedure: Left Tibiocalcaneal Fusion;  Surgeon: Newt Minion, MD;  Location: [place];  Service: Orthopedics;  Laterality: Left;  . APPLICATION OF  WOUND VAC Right 08/06/2016   Procedure: APPLICATION OF PREVENA WOUND VAC;  Surgeon: Newt Minion, MD;  Location: [place];  Service: Orthopedics;  Laterality: Right;  . AV FISTULA PLACEMENT Left [id number]   done at [place]     2 stent   . CHOLECYSTECTOMY    . CORONARY STENT PLACEMENT    . FOOT ARTHRODESIS Right 08/06/2016   Procedure: Right Foot Fusion Lisfranc Joint;  Surgeon: Newt Minion, MD;  Location: [place];  Service: Orthopedics;  Laterality: Right;  . HARDWARE REMOVAL Left 03/16/2015   Procedure: Removal Hardware Left Ankle;  Surgeon: Newt Minion, MD;  Location: [place];  Service: Orthopedics;  Laterality: Left;  . ORIF ANKLE FRACTURE Left 11/20/2014   Procedure: OPEN REDUCTION INTERNAL FIXATION (ORIF) ANKLE FRACTURE;  Surgeon: Renette Butters, MD;  Location: [place];  Service: Orthopedics;  Laterality: Left;  . TONSILLECTOMY       OB History   None      Home Medications    Prior to Admission medications   Medication Sig Start Date End Date Taking? Authorizing Provider  amLODipine (NORVASC) 10 MG tablet Take 10 mg by mouth daily.     [provider]  ammonium lactate (LAC-HYDRIN) 12 % lotion Apply 1 application topically 2 (two) times daily as needed (for itching/irritation).     [provider]  ARIPiprazole (ABILIFY) 15 MG tablet Take 7.5 mg by mouth daily.     [provider]  ARIPiprazole (ABILIFY) 5 MG tablet Take 2.5 mg by mouth at bedtime as needed (for agitation).     [provider]  aspirin EC 81 MG tablet Take 81 mg by mouth daily.    [provider]  atorvastatin (LIPITOR) 40 MG tablet Take 40 mg by mouth at bedtime.     [provider]  carvedilol (COREG) 25 MG tablet Take 25 mg by mouth 2 (two) times daily with a meal.    [provider]  dextrose (GLUTOSE) 40 % GEL Take 1 Tube by mouth once as needed (for blood sugar less than 60).     [provider]  docusate  sodium (COLACE) 100 MG capsule Take 100 mg by mouth 2 (two) times daily.    [provider]  ferrous sulfate 325 (65 FE) MG tablet Take 325 mg by mouth 2 (two) times daily with a meal.    [provider]  gabapentin (NEURONTIN) 100 MG capsule Take 100 mg by mouth at bedtime.    [provider]  hydrALAZINE (APRESOLINE) 50 MG tablet Take 50 mg by mouth 2 (two) times daily.     [provider]  insulin detemir (LEVEMIR) 100 UNIT/ML injection Inject 0.08 mLs (8 Units total) into the skin at bedtime. 09/15/16   Oswald Hillock, MD  insulin lispro (HUMALOG) 100 UNIT/ML injection Inject 2-10 Units into the skin 3 (three) times daily before meals. Pt uses as needed per sliding scale:    150-200:  2 units 201-250:  4 units 251-300:  6 units 301-350:  8 units 351-400:  10 units and call MD    [provider]  isosorbide mononitrate (IMDUR) 60 MG 24 hr tablet Take 60 mg by mouth daily.     [provider]  lidocaine (LMX) 4 % cream Apply 1 application topically 3 (three) times daily.    [provider]  Melatonin 3 MG TABS Take 6 mg by mouth at bedtime as needed (for sleep).    [provider]  metoprolol tartrate (LOPRESSOR) 50 MG tablet Take 50 mg by mouth 2 (two) times daily. Patient unsure of dose    [provider]  nicotine (NICODERM CQ - DOSED IN MG/24 HOURS) 21 mg/24hr patch Place 21 mg onto the skin daily.     [provider]  nitroGLYCERIN (NITROSTAT) 0.4 MG SL tablet Place 0.4 mg under the tongue every 5 (five) minutes as needed for chest pain.    [provider]  ondansetron (ZOFRAN ODT) 4 MG disintegrating tablet Take 1 tablet (4 mg total) by mouth every 8 (eight) hours as needed for nausea or vomiting. 11/11/16   Mabe, Forbes Cellar, MD  pantoprazole (PROTONIX) 20 MG tablet Take 20 mg by mouth 2 (two) times daily.     [provider]  polyethylene glycol (MIRALAX / GLYCOLAX) packet Take 17 g by  mouth daily as needed for mild constipation.     [provider]  sennosides-docusate sodium (SENOKOT-S) 8.6-50 MG tablet Take 2 tablets by mouth 2 (two) times daily as needed for constipation.     [provider]  Vitamin D, Ergocalciferol, (DRISDOL) 50000 units CAPS capsule Take 50,000 Units by mouth every Monday.    [provider]    Family History Family History  Family history unknown: Yes    Social History Social History   Tobacco Use  . Smoking status: Current Every Day Smoker    Packs/day: 1.00    Types: Cigarettes  . Smokeless tobacco: Never Used  Substance Use Topics  . Alcohol use: No  . Drug use: No     Allergies   Patient has no known allergies.   Review of Systems Review of Systems  All systems reviewed and negative, other than as noted in HPI.  Physical Exam Updated Vital Signs BP (!) 107/52   Pulse 82   Temp 97.8 F (36.6 C) (Oral)   Resp 18   Ht 5\' 4"  (1.626 m)   Wt 65.3 kg (144 lb) Comment: dry weight  SpO2 100%   BMI 24.72 kg/m   Physical Exam  Constitutional: She appears well-developed and well-nourished. No distress.  HENT:  Head: Normocephalic and atraumatic.  Eyes: Conjunctivae are normal. Right eye exhibits no discharge. Left eye exhibits no discharge.  Neck: Neck supple.  Cardiovascular: Normal rate, regular rhythm, normal heart sounds and intact distal pulses. Exam reveals no gallop and no friction rub.  No murmur heard. AV fistula left upper extremity  Pulmonary/Chest: Effort normal and breath sounds normal. No respiratory distress.  Abdominal: Soft. She exhibits no distension. There is no tenderness.  Musculoskeletal: She exhibits no edema or tenderness.  Neurological: She is alert.  Skin: Skin is warm and dry.  Psychiatric: She has a normal mood and affect. Her behavior is normal. Thought content normal.  Nursing note and vitals reviewed.    ED Treatments / Results  Labs (all labs ordered are  listed, but only abnormal results are displayed) Labs Reviewed  BASIC METABOLIC PANEL - Abnormal; Notable for  the following components:      Result Value   Chloride 90 (*)    Glucose, Bld 331 (*)    BUN 24 (*)    Creatinine, Ser 4.20 (*)    GFR calc non Af Amer 10 (*)    GFR calc Af Amer 12 (*)    All other components within normal limits  CBC - Abnormal; Notable for the following components:   WBC 13.0 (*)    RBC 3.26 (*)    Hemoglobin 11.3 (*)    HCT 34.9 (*)    MCV 107.1 (*)    MCH 34.7 (*)    All other components within normal limits  CBG MONITORING, ED - Abnormal; Notable for the following components:   Glucose-Capillary 299 (*)    All other components within normal limits  I-STAT TROPONIN, ED    EKG EKG Interpretation  Date/Time:  Saturday December 26 2017 11:57:44 EDT Ventricular Rate:  84 PR Interval:  156 QRS Duration: 82 QT Interval:  456 QTC Calculation: 538 R Axis:   -22 Text Interpretation:  Normal sinus rhythm Anterior infarct , age undetermined ST & T wave abnormality, consider inferolateral ischemia Prolonged QT Abnormal ECG Confirmed by Virgel Manifold [phone]) on 12/26/2017 1:11:20 PM   Radiology Dg Chest 2 View  Result Date: 12/26/2017 CLINICAL DATA:  Chest pain and shortness of breath since yesterday. EXAM: CHEST - 2 VIEW COMPARISON:  Chest x-ray dated 11/11/2016. FINDINGS: Heart size and mediastinal contours are stable. Lungs are clear. No pleural effusion or pneumothorax seen. No acute or suspicious osseous finding. IMPRESSION: No active cardiopulmonary disease. No evidence of pneumonia or pulmonary edema. Electronically Signed   By: Franki Cabot M.D.   On: 12/26/2017 12:35    Procedures Procedures (including critical care time)  CRITICAL CARE Performed by: Virgel Manifold Total critical care time: 35 minutes Critical care time was exclusive of separately billable procedures and treating other patients. Critical care was necessary to treat or prevent  imminent or life-threatening deterioration. Critical care was time spent personally by me on the following activities: development of treatment plan with patient and/or surrogate as well as nursing, discussions with consultants, evaluation of patient's response to treatment, examination of patient, obtaining history from patient or surrogate, ordering and performing treatments and interventions, ordering and review of laboratory studies, ordering and review of radiographic studies, pulse oximetry and re-evaluation of patient's condition.   Medications Ordered in ED Medications - No data to display   Initial Impression / Assessment and Plan / ED Course  I have reviewed the triage vital signs and the nursing notes.  Pertinent labs & imaging results that were available during my care of the patient were reviewed by me and considered in my medical decision making (see chart for details).     67 year old female with chest pain.  Concerning symptoms and abnormal EKG.  She does have a history of CAD.  Unfortunately, she is not the greatest historian and a lot of her evaluations and interventions were done over 10 years ago making reviewing her records more difficult.  Her daughter does report that she thinks the patient has multiple previous catheterizations.  She states that she has been seen by Dr. Daneen Schick previously.  She thinks she may have a coronary stent but does not specifically sure.  Regardless, her symptoms and EKG are concerning. ASA/heparin. Discussed with Dr Marlou Porch, cardiology. They will see in consultation. Possible cath on Monday. Discussed with medicine for admission.   Final  Clinical Impressions(s) / ED Diagnoses   Final diagnoses:  Unstable angina [facility])    ED Discharge Orders    None       Virgel Manifold, MD 12/26/17 1423

## 2017-12-26 NOTE — Progress Notes (Addendum)
Call placed to daughter to confirm that pt is in fact a full code, not DNR. She confirms such.   Page to teaching services to request diet order and code status change.   18:12  Second page to first pager.  6:15 PM Callback received, informed of diet and code status orders needed.  6:44 PM Diet order entered. Code status is still in as DNR; which is inaccurate.   1900 Code status accurate as Full Code

## 2017-12-26 NOTE — Progress Notes (Signed)
Teaching services at bedside, notified of [place] concern about pts rhythm.

## 2017-12-26 NOTE — Consult Note (Signed)
Cardiology Consultation:   Patient ID: [name]; [id number]; 1950/05/27   Admit date: 12/26/2017 Date of Consult: 12/26/2017  Primary Care Provider: [facility] Primary Cardiologist: Sinclair Grooms, MD (2004) Primary Electrophysiologist:  none   Patient Profile:   Robin [name] is a 67 y.o. female with a hx of end-stage renal disease on hemodialysis with prior coronary artery disease who is being seen today for the evaluation of unstable angina, abnormal EKG at the request of Dr. Wilson Singer.  History of Present Illness:   Robin [name] is a 67 year old female on hemodialysis with end-stage renal disease with known coronary artery disease last catheterization report found scanned in in 2011 performed by Dr. Aldona Bar demonstrating a patent ramus ostial stent, 50% RCA and 50% LAD lesion, here with unstable angina.  She was undergoing hemodialysis and had to cut her session short after developing left-sided chest pain, arm tingling, epigastric discomfort.  She was brought here via EMS and her original EKG shows ST segment depression fairly diffusely with T wave inversion.  Currently she is feeling better.  Denies any shortness of breath.  Her daughter was also present to help with gathering history.  Performed a cutting balloon procedure in 2004.  Past Medical History:  Diagnosis Date  . Anemia   . Anxiety   . Arthritis   . Benign hypertension   . Bipolar disorder (Robin [name])   . CHF (congestive heart failure) (Robin [name])   . Chronic kidney disease, stage V [facility])    Nephrologist is with [facility]- (Dr. Tammi Klippel)  . Coronary artery disease   . Depression   . Diabetes mellitus without complication (Robin [name])   . DVT (deep venous thrombosis) (HCC)    right lower leg  . GERD (gastroesophageal reflux disease)   . Heart murmur   . History of blood transfusion   . History of bronchitis   . History of pneumonia   . Shortness of breath dyspnea   . Sleep apnea   . Type 2 diabetes mellitus (Robin [name])       Past Surgical History:  Procedure Laterality Date  . ABDOMINAL HYSTERECTOMY    . ANKLE CLOSED REDUCTION N/A 11/17/2014   Procedure: CLOSED REDUCTION ANKLE;  Surgeon: Earlie Server, MD;  Location: [place];  Service: Orthopedics;  Laterality: N/A;  . ANKLE FUSION Left 03/16/2015   Procedure: Left Tibiocalcaneal Fusion;  Surgeon: Newt Minion, MD;  Location: [place];  Service: Orthopedics;  Laterality: Left;  . APPLICATION OF WOUND VAC Right 08/06/2016   Procedure: APPLICATION OF PREVENA WOUND VAC;  Surgeon: Newt Minion, MD;  Location: [facility];  Service: Orthopedics;  Laterality: Right;  . AV FISTULA PLACEMENT Left [id number]   done at [place]     2 stent   . CHOLECYSTECTOMY    . CORONARY STENT PLACEMENT    . FOOT ARTHRODESIS Right 08/06/2016   Procedure: Right Foot Fusion Lisfranc Joint;  Surgeon: Newt Minion, MD;  Location: [facility];  Service: Orthopedics;  Laterality: Right;  . HARDWARE REMOVAL Left 03/16/2015   Procedure: Removal Hardware Left Ankle;  Surgeon: Newt Minion, MD;  Location: [facility];  Service: Orthopedics;  Laterality: Left;  . ORIF ANKLE FRACTURE Left 11/20/2014   Procedure: OPEN REDUCTION INTERNAL FIXATION (ORIF) ANKLE FRACTURE;  Surgeon: Renette Butters, MD;  Location: [facility];  Service: Orthopedics;  Laterality: Left;  . TONSILLECTOMY       Home Medications:  Prior to Admission medications  Medication Sig Start Date End Date Taking? Authorizing Provider  amLODipine (NORVASC) 10 MG tablet Take 10 mg by mouth daily.     [provider]  ammonium lactate (LAC-HYDRIN) 12 % lotion Apply 1 application topically 2 (two) times daily as needed (for itching/irritation).     [provider]  ARIPiprazole (ABILIFY) 15 MG tablet Take 7.5 mg by mouth daily.     [provider]  atorvastatin (LIPITOR) 40 MG tablet Take 40 mg by mouth at bedtime.     [provider]  carvedilol (COREG) 25 MG tablet Take 25 mg by mouth 2  (two) times daily with a meal.    [provider]  dextrose (GLUTOSE) 40 % GEL Take 1 Tube by mouth once as needed (for blood sugar less than 60).     [provider]  docusate sodium (COLACE) 100 MG capsule Take 100 mg by mouth 2 (two) times daily.    [provider]  ferrous sulfate 325 (65 FE) MG tablet Take 325 mg by mouth 2 (two) times daily with a meal.    [provider]  gabapentin (NEURONTIN) 100 MG capsule Take 100 mg by mouth at bedtime.    [provider]  hydrALAZINE (APRESOLINE) 50 MG tablet Take 50 mg by mouth 2 (two) times daily.     [provider]  insulin detemir (LEVEMIR) 100 UNIT/ML injection Inject 0.08 mLs (8 Units total) into the skin at bedtime. 09/15/16   Robin Hillock, MD  insulin lispro (HUMALOG) 100 UNIT/ML injection Inject 2-10 Units into the skin 3 (three) times daily before meals. Pt uses as needed per sliding scale:    150-200:  2 units 201-250:  4 units 251-300:  6 units 301-350:  8 units 351-400:  10 units and call MD    [provider]  isosorbide mononitrate (IMDUR) 60 MG 24 hr tablet Take 60 mg by mouth daily.     [provider]  lidocaine (LMX) 4 % cream Apply 1 application topically 3 (three) times daily.    [provider]  Melatonin 3 MG TABS Take 6 mg by mouth at bedtime as needed (for sleep).    [provider]  metoprolol tartrate (LOPRESSOR) 50 MG tablet Take 50 mg by mouth 2 (two) times daily. Patient unsure of dose    [provider]  nicotine (NICODERM CQ - DOSED IN MG/24 HOURS) 21 mg/24hr patch Place 21 mg onto the skin daily.     [provider]  nitroGLYCERIN (NITROSTAT) 0.4 MG SL tablet Place 0.4 mg under the tongue every 5 (five) minutes as needed for chest pain.    [provider]  ondansetron (ZOFRAN ODT) 4 MG disintegrating tablet Take 1 tablet (4 mg total) by mouth every 8 (eight) hours as needed for nausea or vomiting.  11/11/16   Mabe, Forbes Cellar, MD  pantoprazole (PROTONIX) 20 MG tablet Take 20 mg by mouth 2 (two) times daily.     [provider]  polyethylene glycol (MIRALAX / GLYCOLAX) packet Take 17 g by mouth daily as needed for mild constipation.     [provider]  sennosides-docusate sodium (SENOKOT-S) 8.6-50 MG tablet Take 2 tablets by mouth 2 (two) times daily as needed for constipation.     [provider]  Vitamin D, Ergocalciferol, (DRISDOL) 50000 units CAPS capsule Take 50,000 Units by mouth every Monday.    [provider]    Inpatient Medications: Scheduled Meds: . fentaNYL (  SUBLIMAZE) injection  75 mcg Intravenous Once  . heparin  3,900 Units Intravenous Once   Continuous Infusions: . heparin     PRN Meds:   Allergies:   No Known Allergies  Social History:   Social History   Socioeconomic History  . Marital status: Divorced    Spouse name: Not on file  . Number of children: Not on file  . Years of education: Not on file  . Highest education level: Not on file  Occupational History  . Not on file  Social Needs  . Financial resource strain: Not on file  . Food insecurity:    Worry: Not on file    Inability: Not on file  . Transportation needs:    Medical: Not on file    Non-medical: Not on file  Tobacco Use  . Smoking status: Current Every Day Smoker    Packs/day: 1.00    Types: Cigarettes  . Smokeless tobacco: Never Used  Substance and Sexual Activity  . Alcohol use: No  . Drug use: No  . Sexual activity: Never  Lifestyle  . Physical activity:    Days per week: Not on file    Minutes per session: Not on file  . Stress: Not on file  Relationships  . Social connections:    Talks on phone: Not on file    Gets together: Not on file    Attends religious service: Not on file    Active member of club or organization: Not on file    Attends meetings of clubs or organizations: Not on file    Relationship status: Not on file  .  Intimate partner violence:    Fear of current or ex partner: Not on file    Emotionally abused: Not on file    Physically abused: Not on file    Forced sexual activity: Not on file  Other Topics Concern  . Not on file  Social History Narrative  . Not on file    Family History:    Family History  Family history unknown: Yes  No early family history of CAD  ROS:  Please see the history of present illness.  No bleeding syncope orthopnea fevers All other ROS reviewed and negative.     Physical Exam/Data:   Vitals:   12/26/17 1151 12/26/17 1156 12/26/17 1215 12/26/17 1245  BP:   (!) 107/52 123/75  Pulse:   82 74  Resp:   18 18  Temp:      TempSrc:      SpO2: 100%  100% 100%  Weight:  144 lb (65.3 kg)    Height:  5\' 4"  (1.626 m)     No intake or output data in the 24 hours ending 12/26/17 1402 Filed Weights   12/26/17 1156  Weight: 144 lb (65.3 kg)   Body mass index is 24.72 kg/m.  General:  Well nourished, well developed, in no acute distress HEENT: normal Lymph: no adenopathy Neck: no JVD Endocrine:  No thryomegaly Vascular: No carotid bruits; FA pulses 2+ bilaterally without bruits  Cardiac:  normal S1, S2; RRR; no murmur  Lungs:  clear to auscultation bilaterally, no wheezing, rhonchi or rales  Abd: soft, nontender, no hepatomegaly  Ext: no edema Musculoskeletal:  No deformities, BUE and BLE strength normal and equal Skin: warm and dry  Neuro:  CNs 2-12 intact, no focal abnormalities noted Psych:  Normal affect   EKG:  The EKG was personally reviewed and demonstrates: Sinus rhythm ST segment  depression with T wave inversion fairly diffusely concerning for global ischemia.  Telemetry:  Telemetry was personally reviewed and demonstrates: Normal sinus rhythm/sinus tachycardia  Relevant CV Studies: Cardiac catheterization report 2011 -Dr. Durwin Nora Little-prior Cutting Balloon Dr. Tamala Julian 2004 - Ostial ramus stent patent, 50% RCA and LAD disease.   Laboratory  Data:  Chemistry Recent Labs  Lab 12/26/17 1203  NA 135  K 4.6  CL 90*  CO2 31  GLUCOSE 331*  BUN 24*  CREATININE 4.20*  CALCIUM 9.2  GFRNONAA 10*  GFRAA 12*  ANIONGAP 14    No results for input(s): PROT, ALBUMIN, AST, ALT, ALKPHOS, BILITOT in the last 168 hours. Hematology Recent Labs  Lab 12/26/17 1203  WBC 13.0*  RBC 3.26*  HGB 11.3*  HCT 34.9*  MCV 107.1*  MCH 34.7*  MCHC 32.4  RDW 15.1  PLT 218   Cardiac EnzymesNo results for input(s): TROPONINI in the last 168 hours.  Recent Labs  Lab 12/26/17 1225  TROPIPOC 0.01    BNPNo results for input(s): BNP, PROBNP in the last 168 hours.  DDimer No results for input(s): DDIMER in the last 168 hours.  Radiology/Studies:  Dg Chest 2 View  Result Date: 12/26/2017 CLINICAL DATA:  Chest pain and shortness of breath since yesterday. EXAM: CHEST - 2 VIEW COMPARISON:  Chest x-ray dated 11/11/2016. FINDINGS: Heart size and mediastinal contours are stable. Lungs are clear. No pleural effusion or pneumothorax seen. No acute or suspicious osseous finding. IMPRESSION: No active cardiopulmonary disease. No evidence of pneumonia or pulmonary edema. Electronically Signed   By: Franki Cabot M.D.   On: 12/26/2017 12:35    Assessment and Plan:   Unstable angina - ECG concerning with T wave inversions fairly diffusely as well as ST depressions concerning for global ischemia.  She has a history of coronary artery disease and believes that she has had stents placed in the past.  I am unable to obtain a prior cardiac catheterization report however.  I think that it is reasonable to place her on IV heparin and plan on diagnostic cardiac catheterization on Monday as long as chest pain remains calm and her troponin does not elevate significantly.  Continue with aggressive secondary prevention.  End-stage renal disease on hemodialysis -Per primary team.  She was unable to complete session today because of feeling nausea, chest  discomfort.  Coronary artery disease - By history she has had cardiac catheterization by Dr. Tamala Julian in the past, perhaps stents placed.  I was able to find a scanned in report from 2011, catheterization by Dr. Aldona Bar which reports previous cutting balloon procedure by Dr. Tamala Julian, and repeat catheterization by Dr. Gwenlyn Found in 2009.  There is report of a possible previously placed stent in a ramus proximal/ostial vessel that was widely patent.  Otherwise 50% stenosis in LAD and RCA.     For questions or updates, please contact [place] Please consult [email or web address] for contact info under Cardiology/STEMI.   Signed, Candee Furbish, MD  12/26/2017 2:02 PM

## 2017-12-26 NOTE — Progress Notes (Signed)
ANTICOAGULATION CONSULT NOTE - Initial Consult  Pharmacy Consult for heparin Indication: chest pain/ACS  No Known Allergies  Patient Measurements: Height: 5\' 4"  (162.6 cm) Weight: 144 lb (65.3 kg)(dry weight) IBW/kg (Calculated) : 54.7 HEPARIN DW (KG): 65.3   Vital Signs: Temp: 97.8 F (36.6 C) (08/03 1148) Temp Source: Oral (08/03 1148) BP: 107/52 (08/03 1215) Pulse Rate: 82 (08/03 1215)  Labs: Recent Labs    12/26/17 1203  HGB 11.3*  HCT 34.9*  PLT 218  CREATININE 4.20*    Estimated Creatinine Clearance: 11.5 mL/min (A) (by C-G formula based on SCr of 4.2 mg/dL (H)).   Medical History: Past Medical History:  Diagnosis Date  . Anemia   . Anxiety   . Arthritis   . Benign hypertension   . Bipolar disorder ([place])   . CHF (congestive heart failure) ([place])   . Chronic kidney disease, stage V [facility])    Nephrologist is with [facility] (Dr. Tammi Klippel)  . Coronary artery disease   . Depression   . Diabetes mellitus without complication ([place])   . DVT (deep venous thrombosis) (HCC)    right lower leg  . GERD (gastroesophageal reflux disease)   . Heart murmur   . History of blood transfusion   . History of bronchitis   . History of pneumonia   . Shortness of breath dyspnea   . Sleep apnea   . Type 2 diabetes mellitus ([place])     Assessment: Robin [name] is a 67 y.o. F admitted for chest pain. Pharmacy consulted to manage heparin. CBC WNL, no overt s/sx of bleeding  Goal of Therapy:  Heparin level 0.3-0.7 units/ml Monitor platelets by anticoagulation protocol: Yes   Plan:  IV Heparin bolus 3900 unit x1 Heparin infusion at 800 units/hr Will check heparin level today at 2200 Daily heparin level, CBC, s/sx of bleeding  Thank you for involving pharmacy in this patient's care.  Janae Bridgeman, PharmD PGY1 Pharmacy Resident Phone: [phone] 12/26/2017 1:37 PM

## 2017-12-26 NOTE — Progress Notes (Signed)
Patient's daughter is en route to her home to verify which medications she took this morning prior to dialysis. Per Pt the medications currently scheduled would typically be given before dialysis, so she cannot confirm if they have been given yet. Will hold all meds until they can verify.

## 2017-12-27 DIAGNOSIS — I130 Hypertensive heart and chronic kidney disease with heart failure and stage 1 through stage 4 chronic kidney disease, or unspecified chronic kidney disease: Secondary | ICD-10-CM

## 2017-12-27 DIAGNOSIS — I214 Non-ST elevation (NSTEMI) myocardial infarction: Secondary | ICD-10-CM

## 2017-12-27 DIAGNOSIS — N184 Chronic kidney disease, stage 4 (severe): Secondary | ICD-10-CM

## 2017-12-27 DIAGNOSIS — N186 End stage renal disease: Secondary | ICD-10-CM

## 2017-12-27 DIAGNOSIS — Z992 Dependence on renal dialysis: Secondary | ICD-10-CM

## 2017-12-27 LAB — HIV ANTIBODY (ROUTINE TESTING W REFLEX): HIV SCREEN 4TH GENERATION: NONREACTIVE

## 2017-12-27 LAB — BASIC METABOLIC PANEL
ANION GAP: 18 — AB (ref 5–15)
BUN: 40 mg/dL — ABNORMAL HIGH (ref 8–23)
CHLORIDE: 90 mmol/L — AB (ref 98–111)
CO2: 27 mmol/L (ref 22–32)
Calcium: 10.1 mg/dL (ref 8.9–10.3)
Creatinine, Ser: 6.54 mg/dL — ABNORMAL HIGH (ref 0.44–1.00)
GFR calc non Af Amer: 6 mL/min — ABNORMAL LOW (ref >60)
GFR, EST AFRICAN AMERICAN: 7 mL/min — AB (ref >60)
GLUCOSE: 155 mg/dL — AB (ref 70–99)
Potassium: 5.3 mmol/L — ABNORMAL HIGH (ref 3.5–5.1)
Sodium: 135 mmol/L (ref 135–145)

## 2017-12-27 LAB — TROPONIN I
TROPONIN I: 0.03 ng/mL — AB (ref <0.03)
TROPONIN I: 0.03 ng/mL — AB (ref <0.03)
TROPONIN I: 0.04 ng/mL — AB (ref <0.03)
Troponin I: 0.03 ng/mL (ref <0.03)

## 2017-12-27 LAB — HEPARIN LEVEL (UNFRACTIONATED)
HEPARIN UNFRACTIONATED: 0.27 [IU]/mL — AB (ref 0.30–0.70)
Heparin Unfractionated: 0.33 IU/mL (ref 0.30–0.70)

## 2017-12-27 LAB — CBC
HCT: 33.6 % — ABNORMAL LOW (ref 36.0–46.0)
HEMOGLOBIN: 10.6 g/dL — AB (ref 12.0–15.0)
MCH: 33.2 pg (ref 26.0–34.0)
MCHC: 31.5 g/dL (ref 30.0–36.0)
MCV: 105.3 fL — AB (ref 78.0–100.0)
PLATELETS: 204 10*3/uL (ref 150–400)
RBC: 3.19 MIL/uL — AB (ref 3.87–5.11)
RDW: 15 % (ref 11.5–15.5)
WBC: 7.5 10*3/uL (ref 4.0–10.5)

## 2017-12-27 LAB — GLUCOSE, CAPILLARY
GLUCOSE-CAPILLARY: 216 mg/dL — AB (ref 70–99)
Glucose-Capillary: 143 mg/dL — ABNORMAL HIGH (ref 70–99)
Glucose-Capillary: 203 mg/dL — ABNORMAL HIGH (ref 70–99)
Glucose-Capillary: 265 mg/dL — ABNORMAL HIGH (ref 70–99)

## 2017-12-27 MED ORDER — HEPARIN BOLUS VIA INFUSION
1000.0000 [IU] | Freq: Once | INTRAVENOUS | Status: AC
  Administered 2017-12-27: 1000 [IU] via INTRAVENOUS
  Filled 2017-12-27: qty 1000

## 2017-12-27 MED ORDER — SODIUM CHLORIDE 0.9 % IV SOLN
INTRAVENOUS | Status: DC
  Administered 2017-12-28: 06:00:00 via INTRAVENOUS

## 2017-12-27 MED ORDER — SODIUM CHLORIDE 0.9 % IV SOLN: 250.0000 mL | INTRAVENOUS | Status: DC | PRN

## 2017-12-27 MED ORDER — CALCITRIOL 0.5 MCG PO CAPS
1.2500 ug | ORAL_CAPSULE | ORAL | Status: DC
  Administered 2017-12-28: 1.25 ug via ORAL

## 2017-12-27 MED ORDER — RENA-VITE PO TABS
1.0000 | ORAL_TABLET | Freq: Every day | ORAL | Status: DC
  Administered 2017-12-27 – 2017-12-28 (×2): 1 via ORAL
  Filled 2017-12-27 (×2): qty 1

## 2017-12-27 MED ORDER — ASPIRIN 81 MG PO CHEW
81.0000 mg | CHEWABLE_TABLET | ORAL | Status: AC
  Administered 2017-12-28: 81 mg via ORAL
  Filled 2017-12-27: qty 1

## 2017-12-27 MED ORDER — CHLORHEXIDINE GLUCONATE CLOTH 2 % EX PADS
6.0000 | MEDICATED_PAD | Freq: Every day | CUTANEOUS | Status: DC
  Administered 2017-12-29: 6 via TOPICAL

## 2017-12-27 MED ORDER — NEPRO/CARBSTEADY PO LIQD
237.0000 mL | Freq: Two times a day (BID) | ORAL | Status: DC
  Administered 2017-12-27 (×2): 237 mL via ORAL
  Filled 2017-12-27 (×6): qty 237

## 2017-12-27 MED ORDER — SODIUM CHLORIDE 0.9% FLUSH: 3.0000 mL | Freq: Two times a day (BID) | INTRAVENOUS | Status: DC

## 2017-12-27 MED ORDER — SODIUM CHLORIDE 0.9% FLUSH: 3.0000 mL | INTRAVENOUS | Status: DC | PRN

## 2017-12-27 NOTE — Progress Notes (Signed)
ANTICOAGULATION CONSULT NOTE   Pharmacy Consult for Heparin Indication: chest pain/ACS  No Known Allergies  Patient Measurements: Height: 5\' 4"  (162.6 cm) Weight: 151 lb 1.6 oz (68.5 kg) IBW/kg (Calculated) : 54.7 HEPARIN DW (KG): 68.4   Vital Signs: Temp: 97.7 F (36.5 C) (08/03 2009) Temp Source: Oral (08/03 2009) BP: 112/71 (08/03 2009) Pulse Rate: 80 (08/03 2009)  Labs: Recent Labs    12/26/17 1203 12/26/17 2024 12/26/17 2228  HGB 11.3*  --   --   HCT 34.9*  --   --   PLT 218  --   --   HEPARINUNFRC  --   --  0.26*  CREATININE 4.20*  --   --   TROPONINI  --  0.04*  --     Estimated Creatinine Clearance: 12.7 mL/min (A) (by C-G formula based on SCr of 4.2 mg/dL (H)).   Medical History: Past Medical History:  Diagnosis Date  . Anemia   . Anxiety   . Arthritis   . Benign hypertension   . Bipolar disorder ([place])   . CHF (congestive heart failure) ([place])   . Chronic kidney disease, stage V [facility])    Nephrologist is with [facility] (Dr. Tammi Klippel)  . Coronary artery disease   . Depression   . Diabetes mellitus without complication ([place])   . DVT (deep venous thrombosis) (H[facility])    right lower leg  . GERD (gastroesophageal reflux disease)   . Heart murmur   . History of blood transfusion   . History of bronchitis   . History of pneumonia   . Shortness of breath dyspnea   . Sleep apnea   . Type 2 diabetes mellitus ([place])     Assessment: Robin [name] is a 67 y.o. F admitted for chest pain. Pharmacy consulted to manage heparin. CBC WNL, no overt s/sx of bleeding.  8/4 AM update: initial heparin level is just below goal  Goal of Therapy:  Heparin level 0.3-0.7 units/ml Monitor platelets by anticoagulation protocol: Yes   Plan:  Inc heparin to 900 units/hr 0800 HL  Narda Bonds, PharmD, BCPS Clinical Pharmacist Phone: [phone]

## 2017-12-27 NOTE — Plan of Care (Signed)
  Problem: Nutrition: Goal: Adequate nutrition will be maintained Outcome: Progressing   Problem: Coping: Goal: Level of anxiety will decrease Outcome: Progressing   

## 2017-12-27 NOTE — Progress Notes (Signed)
Patient resting comfortably during shift report. Denies complaints.  

## 2017-12-27 NOTE — Progress Notes (Signed)
Internal Medicine Attending  Date: 12/27/2017  Patient name: HENRIETTE HESSER Medical record number: [id number] Date of birth: 1950/08/17 Age: 67 y.o. Gender: female  I saw and evaluated the patient. I reviewed the resident's note by Dr. Sherry Ruffing and I agree with the resident's findings and plans as documented in her progress note.  When seen on rounds this morning Ms. Bert notes she feels better. Specifically, her chest pain and abdominal pain are better. Her big complaint today is extreme fatigue. Her EKG from this morning was notable for resolution of the ST segment depression and pseudonormalization of the T waves in the inferior leads. She continues to have T-wave inversions in a strain pattern in the lateral leads. Nephrology evaluated her and illicited a history that was different than the one she provided [place] yesterday regarding increased left upper extremity pain during dialysis. They therefore do not feel that workup for dialysis associated steal syndrome is required at this time. She is scheduled for a cardiac catheterization tomorrow to assess her chest pain and will also undergo hemodialysis. We will continue the IV heparin drip overnight. Unless coronary intervention is required based on the cardiac catheterization, she should be stable for discharge home tomorrow.

## 2017-12-27 NOTE — Consult Note (Addendum)
[facility] Renal Consultation Note    Indication for Consultation:  Management of ESRD/hemodialysis; anemia, hypertension/volume and secondary hyperparathyroidism PCP:  HPI: 25 Robin [name] is a 67 y.o. female with ESRD on hemodialysis MWF at [facility]. PMH of T2DM, HTN, CAD, DVT, OSA, SHPT, AOCD. H/O truncated HD treatments.   Patient was sent to ED from dialysis unit yesterday with C/O L sided chest pain and abdominal pain. Upon arrival to ED, EKG showed ST depression inferolaterally, diffuse T wave inversions. Troponin 0.04. Labs otherwise unremarkable except BS 331. Chest xray without evidence of volume overload. Patient was seen by cardiology and has been admitted for unstable angina. Plans in place for cardiac cath tomorrow.   Patient currently sitting up at bedside. She denies C/O chest pain, does C/O SOB-says she just finished her bath and she is tired. Difficult to obtain history from patient but she says she has been having chest pain "off and on" for a long time. Says chest pain usually triggered by exertion, relieved with rest. Says she has been having abdominal pain which seems worse at dialysis. Says she has pain in AVF-no swelling of LUA distal of AVF, L hand warm with pulses present. Reviewed ecube notes-she has not complained of pain in AVF except once after difficult stick. Nurse notes discrepancy in what patient is reporting today than what she reported yesterday.   Past Medical History:  Diagnosis Date  . Anemia   . Anxiety   . Arthritis   . Benign hypertension   . Bipolar disorder ([place])   . CHF (congestive heart failure) ([place])   . Chronic kidney disease, stage V [facility])    Nephrologist is with [facility] (Dr. Tammi Klippel)  . Coronary artery disease   . Depression   . Diabetes mellitus without complication ([place])   . DVT (deep venous thrombosis) (H[facility])    right lower leg  . GERD (gastroesophageal reflux disease)   . Heart murmur   . History  of blood transfusion   . History of bronchitis   . History of pneumonia   . Shortness of breath dyspnea   . Sleep apnea   . Type 2 diabetes mellitus ([place])    Past Surgical History:  Procedure Laterality Date  . ABDOMINAL HYSTERECTOMY    . ANKLE CLOSED REDUCTION N/A 11/17/2014   Procedure: CLOSED REDUCTION ANKLE;  Surgeon: Earlie Server, MD;  Location: [place];  Service: Orthopedics;  Laterality: N/A;  . ANKLE FUSION Left 03/16/2015   Procedure: Left Tibiocalcaneal Fusion;  Surgeon: Newt Minion, MD;  Location: [place];  Service: Orthopedics;  Laterality: Left;  . APPLICATION OF WOUND VAC Right 08/06/2016   Procedure: APPLICATION OF PREVENA WOUND VAC;  Surgeon: Newt Minion, MD;  Location: [place];  Service: Orthopedics;  Laterality: Right;  . AV FISTULA PLACEMENT Left [id number]   done at [place]     2 stent   . CHOLECYSTECTOMY    . CORONARY STENT PLACEMENT    . FOOT ARTHRODESIS Right 08/06/2016   Procedure: Right Foot Fusion Lisfranc Joint;  Surgeon: Newt Minion, MD;  Location: [place];  Service: Orthopedics;  Laterality: Right;  . HARDWARE REMOVAL Left 03/16/2015   Procedure: Removal Hardware Left Ankle;  Surgeon: Newt Minion, MD;  Location: [place];  Service: Orthopedics;  Laterality: Left;  . ORIF ANKLE FRACTURE Left 11/20/2014   Procedure: OPEN REDUCTION INTERNAL FIXATION (ORIF) ANKLE FRACTURE;  Surgeon: Renette Butters, MD;  Location:  [place] [facility];  Service: Orthopedics;  Laterality: Left;  . TONSILLECTOMY     Family History  Family history unknown: Yes   Social History:  reports that she has been smoking cigarettes.  She has been smoking about 1.00 pack per day. She has never used smokeless tobacco. She reports that she does not drink alcohol or use drugs. No Known Allergies Prior to Admission medications   Medication Sig Start Date End Date Taking? Authorizing Provider  ammonium lactate (LAC-HYDRIN) 12 % lotion Apply 1 application topically 2 (two) times  daily as needed (for itching/irritation).    Yes [provider]  ARIPiprazole (ABILIFY) 15 MG tablet Take 7.5 mg by mouth daily.    Yes [provider]  atorvastatin (LIPITOR) 40 MG tablet Take 20 mg by mouth at bedtime.    Yes [provider]  dextrose (GLUTOSE) 40 % GEL Take 1 Tube by mouth once as needed (for blood sugar less than 60).    Yes [provider]  docusate sodium (COLACE) 100 MG capsule Take 100 mg by mouth daily.    Yes [provider]  gabapentin (NEURONTIN) 100 MG capsule Take 100-200 mg by mouth See admin instructions. Take 100mg   (one capsule) in the morning and 200mg  (2 capsules) at night   Yes [provider]  hydrALAZINE (APRESOLINE) 50 MG tablet Take 25 mg by mouth daily.    Yes [provider]  insulin detemir (LEVEMIR) 100 UNIT/ML injection Inject 0.08 mLs (8 Units total) into the skin at bedtime. Patient taking differently: Inject 25-40 Units into the skin See admin instructions. 25 units in the AM and 40 units at bedtime 09/15/16  Yes Lama, Marge Duncans, MD  insulin lispro (HUMALOG) 100 UNIT/ML injection Inject 3-15 Units into the skin 3 (three) times daily before meals. Pt uses as needed per sliding scale:    150-200:  3  units 201-250:  5 units 251-300:  8 units 301-350:  10 units 351-400:  12 units and call MD 400+ 15 units   Yes [provider]  isosorbide mononitrate (IMDUR) 60 MG 24 hr tablet Take 60 mg by mouth daily.    Yes [provider]  lidocaine (LMX) 4 % cream Apply 1 application topically 3 (three) times daily.   Yes [provider]  Melatonin 5 MG TABS Take 10 mg by mouth at bedtime as needed (for sleep).    Yes [provider]  metoprolol tartrate (LOPRESSOR) 50 MG tablet Take 50 mg by mouth 2 (two) times daily. Patient unsure of dose   Yes [provider]  multivitamin (RENA-VIT) TABS tablet Take 1 tablet by mouth daily. 12/11/17  Yes [provider]  nitroGLYCERIN (NITROSTAT) 0.4 MG SL tablet Place 0.4 mg under the tongue every 5 (five) minutes as needed for chest pain.   Yes [provider]  pantoprazole (PROTONIX) 20 MG tablet Take 20 mg by mouth daily.    Yes [provider]  polyethylene glycol (MIRALAX / GLYCOLAX) packet Take 17 g by mouth daily as needed for mild constipation.    Yes [provider]  sennosides-docusate sodium (SENOKOT-S) 8.6-50 MG tablet Take 2 tablets by mouth 2 (two) times daily as needed for constipation.    Yes [provider]  sevelamer carbonate (RENVELA) 800 MG tablet Take 1,600 mg by mouth 3 (three) times daily with meals.   Yes [provider]  ondansetron (ZOFRAN ODT) 4 MG disintegrating tablet Take 1 tablet (4 mg total)  by mouth every 8 (eight) hours as needed for nausea or vomiting. Patient not taking: Reported on 12/26/2017 11/11/16   Pixie Casino, MD   Current Facility-Administered Medications  Medication Dose Route Frequency Provider Last Rate Last Dose  . acetaminophen (TYLENOL) tablet 650 mg  650 mg Oral Q6H PRN Ledell Noss, MD       Or  . acetaminophen (TYLENOL) suppository 650 mg  650 mg Rectal Q6H PRN Ledell Noss, MD      . ARIPiprazole (ABILIFY) tablet 7.5 mg  7.5 mg Oral Daily Ledell Noss, MD      . aspirin chewable tablet 81 mg  81 mg Oral Daily Ledell Noss, MD      . atorvastatin (LIPITOR) tablet 20 mg  20 mg Oral QHS Ledell Noss, MD   20 mg at 12/26/17 2300  . docusate sodium (COLACE) capsule 100 mg  100 mg Oral Daily Ledell Noss, MD      . gabapentin (NEURONTIN) capsule 200 mg  200 mg Oral QHS Oval Linsey, MD   200 mg at 12/26/17 2300   And  . gabapentin (NEURONTIN) capsule 100 mg  100 mg Oral Daily Oval Linsey, MD      . heparin ADULT infusion 100 units/mL (25000 units/229mL sodium chloride 0.45%)  900 Units/hr Intravenous Continuous Erenest Blank, RPH 9 mL/hr at 12/27/17 0022 900 Units/hr at 12/27/17 0022  . hydrALAZINE  (APRESOLINE) tablet 25 mg  25 mg Oral Daily Ledell Noss, MD      . insulin aspart (novoLOG) injection 0-5 Units  0-5 Units Subcutaneous QHS Ledell Noss, MD   3 Units at 12/26/17 2255  . insulin aspart (novoLOG) injection 0-9 Units  0-9 Units Subcutaneous TID WC Ledell Noss, MD   1 Units at 12/27/17 0845  . insulin detemir (LEVEMIR) injection 6 Units  6 Units Subcutaneous Daily Ledell Noss, MD      . isosorbide mononitrate (IMDUR) 24 hr tablet 60 mg  60 mg Oral Daily Ledell Noss, MD      . metoprolol tartrate (LOPRESSOR) tablet 50 mg  50 mg Oral BID Ledell Noss, MD   50 mg at 12/26/17 2300  . pantoprazole (PROTONIX) EC tablet 20 mg  20 mg Oral Daily Ledell Noss, MD      . polyethylene glycol (MIRALAX / GLYCOLAX) packet 17 g  17 g Oral Daily PRN Ledell Noss, MD      . ramelteon (ROZEREM) tablet 8 mg  8 mg Oral QHS Ledell Noss, MD   8 mg at 12/27/17 0159  . senna-docusate (Senokot-S) tablet 2 tablet  2 tablet Oral BID PRN Ledell Noss, MD      . sevelamer carbonate (RENVELA) tablet 1,600 mg  1,600 mg Oral TID WC Ledell Noss, MD   1,600 mg at 12/27/17 0800   Labs: Basic Metabolic Panel: Recent Labs  Lab 12/26/17 1203 12/27/17 0654  NA 135 135  K 4.6 5.3*  CL 90* 90*  CO2 31 27  GLUCOSE 331* 155*  BUN 24* 40*  CREATININE 4.20* 6.54*  CALCIUM 9.2 10.1   Liver Function Tests: No results for input(s): AST, ALT, ALKPHOS, BILITOT, PROT, ALBUMIN in the last 168 hours. No results for input(s): LIPASE, AMYLASE in the last 168 hours. No results for input(s): AMMONIA in the last 168 hours. CBC: Recent Labs  Lab 12/26/17 1203 12/27/17 0654  WBC 13.0* 7.5  HGB 11.3* 10.6*  HCT 34.9* 33.6*  MCV 107.1* 105.3*  PLT 218 204   Cardiac Enzymes: Recent  Labs  Lab 12/26/17 2024 12/27/17 0019 12/27/17 0654  TROPONINI 0.04* 0.03* 0.04*   CBG: Recent Labs  Lab 12/26/17 1153 12/26/17 1715 12/26/17 2130 12/27/17 0745  GLUCAP 299* 166* 294* 143*   Iron Studies: No results for input(s): IRON, TIBC,  TRANSFERRIN, FERRITIN in the last 72 hours. Studies/Results: Dg Chest 2 View  Result Date: 12/26/2017 CLINICAL DATA:  Chest pain and shortness of breath since yesterday. EXAM: CHEST - 2 VIEW COMPARISON:  Chest x-ray dated 11/11/2016. FINDINGS: Heart size and mediastinal contours are stable. Lungs are clear. No pleural effusion or pneumothorax seen. No acute or suspicious osseous finding. IMPRESSION: No active cardiopulmonary disease. No evidence of pneumonia or pulmonary edema. Electronically Signed   By: Franki Cabot M.D.   On: 12/26/2017 12:35    ROS: As per HPI otherwise negative.   Physical Exam: Vitals:   12/26/17 1521 12/26/17 2009 12/27/17 0050 12/27/17 0624  BP: 111/62 112/71 (!) 127/55 121/79  Pulse: 84 80 68 71  Resp: 20 16 16 16   Temp: 98.3 F (36.8 C) 97.7 F (36.5 C) 98.2 F (36.8 C) 98.4 F (36.9 C)  TempSrc: Oral Oral Oral Oral  SpO2: 100% 98% 98% 99%  Weight: 68.5 kg (151 lb 1.6 oz)   68.6 kg (151 lb 3.2 oz)  Height: 5\' 4"  (1.626 m)        General: Pleasant elderly female in no acute distress. Head: Normocephalic, atraumatic, sclera non-icteric, mucus membranes are moist Neck: Supple. JVD not elevated. Lungs: Clear bilaterally to auscultation without wheezes, rales, or rhonchi. Breathing is unlabored. Heart: RRR with S1 S2. No murmurs, rubs, or gallops appreciated. Abdomen: Soft, non-tender, non-distended with normoactive bowel sounds. No rebound/guarding. No obvious abdominal masses. M-S:  Strength and tone appear normal for age. Lower extremities:without edema or ischemic changes, no open wounds. RLE > LLE.  Neuro: Alert and oriented X 3. Moves all extremities spontaneously. Psych:  Responds to questions appropriately with a normal affect. Dialysis Access: LUA AVF + bruit.   Dialysis Orders: [place] 3 hr 45 min 180 NRe 400/800  68 kg 2.0 K/2.0 Ca  -Heparin 2000 units IV TIW -Mircera 75 mcg IV q 2 weeks (last dose 12/24/17 last HGB 10.9  12/24/17) -Venofer 50 mg IV weekly (last dose 12/24/2017 Last Fe 128 Tsat 46% 12/17/17) -Calcitriol 1.25 mcg PO TIW (last PTH 241 12/17/17)   Assessment/Plan: 1.  Unstable Angina-per primary/cardiology. Cardiac cath tomorrow.  2.  Abdominal pain-per primary 3.  Pain in AVF-no evidence of steal syndrome, AF in HD unit has been stable. No compelling evidence for immediate work up now-can follow up with [facility] as OP for fistulagram unless pain worsens acutely.  4.  ESRD -  MWF via LUA AVF. Needs HD 1st shift in AM. No heparin-on heparin gtt.  5.  Hypertension/volume  - BP well controlled. On hydralazine, Imdur, metoprolol.  6.  Anemia  - HGB 11.3 on adm, 10.6 today. Recent ESA dose. Follow trend.  7.  Metabolic bone disease - Check renal profile with HD tomorrow. Continue binders, VDRA. Ca 10.1 8.  Nutrition - Renal carb mod diet, renal vit, nepro. 9.  DM-per primary.   Rita H. Owens Shark, NP-C 12/27/2017, 10:35 AM  [employer] [phone]

## 2017-12-27 NOTE — Progress Notes (Signed)
[place] for Heparin Indication: chest pain/ACS  No Known Allergies  Patient Measurements: Height: 5\' 4"  (162.6 cm) Weight: 151 lb 3.2 oz (68.6 kg)(scale b) IBW/kg (Calculated) : 54.7 HEPARIN DW (KG): 68.4   Vital Signs: Temp: 98.4 F (36.9 C) (08/04 0624) Temp Source: Oral (08/04 0624) BP: 121/79 (08/04 0624) Pulse Rate: 71 (08/04 0624)  Labs: Recent Labs    12/26/17 1203 12/26/17 2024 12/26/17 2228 12/27/17 0019 12/27/17 0654  HGB 11.3*  --   --   --  10.6*  HCT 34.9*  --   --   --  33.6*  PLT 218  --   --   --  204  HEPARINUNFRC  --   --  0.26*  --  0.33  CREATININE 4.20*  --   --   --   --   TROPONINI  --  0.04*  --  0.03*  --     Estimated Creatinine Clearance: 12.7 mL/min (A) (by C-G formula based on SCr of 4.2 mg/dL (H)).   Medical History: Past Medical History:  Diagnosis Date  . Anemia   . Anxiety   . Arthritis   . Benign hypertension   . Bipolar disorder ([place])   . CHF (congestive heart failure) ([place])   . Chronic kidney disease, stage V [facility])    Nephrologist is with [facility] (Dr. Tammi Klippel)  . Coronary artery disease   . Depression   . Diabetes mellitus without complication ([place])   . DVT (deep venous thrombosis) ([facility])    right lower leg  . GERD (gastroesophageal reflux disease)   . Heart murmur   . History of blood transfusion   . History of bronchitis   . History of pneumonia   . Shortness of breath dyspnea   . Sleep apnea   . Type 2 diabetes mellitus ([place])     Assessment: Robin [name] is a 67 y.o. F admitted for chest pain. Pharmacy consulted to manage heparin. Initial CBC WNL, no overt s/sx of bleeding. No PTA anticoagulation.   Heparin level at goal this morning at 0.33 after increase in rate to 900 units/hr. While lab was drawn an hour earlier than desired, I do not think this has strongly skewed the resulting heparin level. CBC remains WNL, no documented bleeding  Goal of Therapy:  Heparin  level 0.3-0.7 units/ml Monitor platelets by anticoagulation protocol: Yes   Plan:  Continue heparin at 900 units/hr 1600 heparin level Monitor CBC, s/sx bleeding  Brendolyn Patty, PharmD PGY1 Pharmacy Resident Phone [phone]  12/27/2017   7:46 AM

## 2017-12-27 NOTE — Progress Notes (Signed)
Progress Note  [name] Date of Encounter: 12/27/2017  Primary Cardiologist: Sinclair Grooms, MD   Subjective   Feels better. No current CP, no SOB  Inpatient Medications    Scheduled Meds: . ARIPiprazole  7.5 mg Oral Daily  . aspirin  81 mg Oral Daily  . atorvastatin  20 mg Oral QHS  . docusate sodium  100 mg Oral Daily  . gabapentin  200 mg Oral QHS   And  . gabapentin  100 mg Oral Daily  . hydrALAZINE  25 mg Oral Daily  . insulin aspart  0-5 Units Subcutaneous QHS  . insulin aspart  0-9 Units Subcutaneous TID WC  . insulin detemir  6 Units Subcutaneous Daily  . isosorbide mononitrate  60 mg Oral Daily  . metoprolol tartrate  50 mg Oral BID  . pantoprazole  20 mg Oral Daily  . ramelteon  8 mg Oral QHS  . sevelamer carbonate  1,600 mg Oral TID WC   Continuous Infusions: . heparin 900 Units/hr (12/27/17 0022)   PRN Meds: acetaminophen **OR** acetaminophen, polyethylene glycol, senna-docusate   Vital Signs    Vitals:   12/26/17 1521 12/26/17 2009 12/27/17 0050 12/27/17 0624  BP: 111/62 112/71 (!) 127/55 121/79  Pulse: 84 80 68 71  Resp: 20 16 16 16   Temp: 98.3 F (36.8 C) 97.7 F (36.5 C) 98.2 F (36.8 C) 98.4 F (36.9 C)  TempSrc: Oral Oral Oral Oral  SpO2: 100% 98% 98% 99%  Weight: 151 lb 1.6 oz (68.5 kg)   151 lb 3.2 oz (68.6 kg)  Height: 5\' 4"  (1.626 m)       Intake/Output Summary (Last 24 hours) at 12/27/2017 0950 Last data filed at 12/27/2017 0725 Gross per 24 hour  Intake 414.83 ml  Output 0 ml  Net 414.83 ml   Filed Weights   12/26/17 1156 12/26/17 1521 12/27/17 0624  Weight: 144 lb (65.3 kg) 151 lb 1.6 oz (68.5 kg) 151 lb 3.2 oz (68.6 kg)    Telemetry    No adverse rhythms - Personally Reviewed  ECG    ST depression, TWI - Personally Reviewed  Physical Exam   GEN: No acute distress.   Neck: No JVD Cardiac: RRR, no murmurs, rubs, or gallops.  Respiratory: Clear to auscultation bilaterally. GI: Soft, nontender,  non-distended  MS: No edema; No deformity. Neuro:  Nonfocal  Psych: Normal affect   Labs    Chemistry Recent Labs  Lab 12/26/17 1203 12/27/17 0654  NA 135 135  K 4.6 5.3*  CL 90* 90*  CO2 31 27  GLUCOSE 331* 155*  BUN 24* 40*  CREATININE 4.20* 6.54*  CALCIUM 9.2 10.1  GFRNONAA 10* 6*  GFRAA 12* 7*  ANIONGAP 14 18*     Hematology Recent Labs  Lab 12/26/17 1203 12/27/17 0654  WBC 13.0* 7.5  RBC 3.26* 3.19*  HGB 11.3* 10.6*  HCT 34.9* 33.6*  MCV 107.1* 105.3*  MCH 34.7* 33.2  MCHC 32.4 31.5  RDW 15.1 15.0  PLT 218 204    Cardiac Enzymes Recent Labs  Lab 12/26/17 2024 12/27/17 0019 12/27/17 0654  TROPONINI 0.04* 0.03* 0.04*    Recent Labs  Lab 12/26/17 1225  TROPIPOC 0.01     BNPNo results for input(s): BNP, PROBNP in the last 168 hours.   DDimer No results for input(s): DDIMER in the last 168 hours.   Radiology    Dg Chest 2 View  Result Date: 12/26/2017 CLINICAL DATA:  Chest pain and shortness of breath since yesterday. EXAM: CHEST - 2 VIEW COMPARISON:  Chest x-ray dated 11/11/2016. FINDINGS: Heart size and mediastinal contours are stable. Lungs are clear. No pleural effusion or pneumothorax seen. No acute or suspicious osseous finding. IMPRESSION: No active cardiopulmonary disease. No evidence of pneumonia or pulmonary edema. Electronically Signed   By: Franki Cabot M.D.   On: 12/26/2017 12:35    Cardiac Studies   Await cath   Cardiac catheterization report 2011 -Dr. Durwin Nora Little-prior [place] Dr. Tamala Julian 2004 - Ostial ramus stent patent, 50% RCA and LAD disease.   Patient Profile     67 y.o. female with [place], CAD, ESRD  Assessment & Plan    [place]  - TWI, ST depression  - Cath Monday. Orders placed.   CAD  - Prior cath reviewed as above  ESRD  - HD     For questions or updates, please contact [place] Please consult [email or web address] for contact info under Cardiology/STEMI.      Signed, Candee Furbish, MD  12/27/2017,  9:50 AM

## 2017-12-27 NOTE — Progress Notes (Signed)
Subjective: Patient reported feeling well today.  No acute events overnight.  Denies any chest pain fevers, chills. Reports some abdominal pain but reports that it is better.   Objective:  Vital signs in last 24 hours: Vitals:   12/26/17 1415 12/26/17 1521 12/26/17 2009 12/27/17 0050  BP: 126/72 111/62 112/71 (!) 127/55  Pulse: 83 84 80 68  Resp: 12 20 16 16   Temp:  98.3 F (36.8 C) 97.7 F (36.5 C) 98.2 F (36.8 C)  TempSrc:  Oral Oral Oral  SpO2: 100% 100% 98% 98%  Weight:  151 lb 1.6 oz (68.5 kg)    Height:  5\' 4"  (1.626 m)     General: Well-developed, well-nourished, no acute distress, right upper extremity resting tremor HEENT: Normocephalic, atraumatic Cardiac: Regular rate and rhythm, no murmurs rubs or gallops, and is to palpation in mid chest Pulmonary: Lungs clear to auscultation bilaterally, no wheezes, rhonchi rales Abdomen: Soft, tenderness to palpation epigastric area, no guarding Extremity: No lower extremity edema, graft upper extremity with thrill, 2+ pulses throughout Neuro: PERRLA, moves all extremities Psychiatry: Normal mood and affect   Assessment/Plan: This is a 67 year old female with a PMH of CHF (EF in 2011 showed 35-40%, last echo in 3/18 showed EF 50-55% and grade 2 diastolic dysfunction), CKD stage 4, HTN, DM, GERD, and bipolar disorder presenting with acute chest pain. Admitted for unstable angina.    NSTEMI: Patient presents with atypical angina that is not worsened with exertion or relieved by rest but his relief with nitroglycerin.  She has been having this for couple years now and it has been increasing in frequency over the past couple months.  Associated with diaphoresis, shortness of breath, and left arm pain that can occur with or without the pain. She also reports abdominal pain that get worse with eating and when on dialysis which is concerning for a coronary steal syndrome. She does not seem to complete her whole dialysis courses. She was  found to be hemodynamically stable. On exam she was found to had epigastric tenderness, and tenderness in the left upper chest to palpation. EKG showed ST depression in leads II, III, and aVF. This is likely multifactorial in nature, possibly related to GERD vs ACS vs musculoskeletal. There is more of a concern for MSK and GERD symptoms given the patients atypical chest pain presentation.  Repeat EKG showed normal sinus rhythm with occasional PVCs and no ST depressions in the leads 2, 3 and aVF which is changed from the previous EKG. This is concerning that this may be cardiac related. Her troponin also increased to 0.03 today.  -Telemetry  -Cardiology following: plan for catherterization on Monday -Heparin drip -ASA 81mg  daily -Pantoprazole   Macrocytic anemia: Hgb 11.3, MCV 107.1. Patient is near her baseline Hgb. Today her Hgb is 10.6. Will continue to monitor.   Acute on chronic kidney disease: ESRD. Patient is on hemodialysis Tuesday, Thursday and Saturday.. On admission BUN 24 and Cr 4.2. Cr increased from baseline which is around 2.5. She was euvolemic on exam. She only got 2 hours of her dialysis today due to the chest pain so this is likely due to not completing her dialysis. BMP today showed K 5.3, BUN 40 and Cr 6.54. Nephrology evaluated and will plan on dialysis tomorrow.   Heart failure: EF in 2011 showed 35-40%, last echo in 3/18 showed EF 50-55% and grade 2 diastolic dysfunction.  -On hydralazine, lipitor, metoprolol, and imdur at home -Continue home medications  HTN: Patient was normotensive on admission. She is on metoprolol, and hydralazine at home.  -Continue home medications  Diabetes mellitus type 2: Patients glucose on admission was 331. She is on humalog TID with meals, and levemir 25 in AM and 40 in PM at home.  -SSI-sensitive -Levemir 6 units -CBG q4 hour  Bipolar disorder: Is on aripiprazole at home. Continue home medications. GERD: Patient is on omeprazole at  home. Continue pantoprazole.  FEN: No fluids, replete lytes prn, Renal diet or  VTE ppx: Heparin Code Status: FULL   Dispo: Anticipated discharge in approximately 2 day(s).   Asencion Noble, MD 12/27/2017, 6:20 AM Pager: [phone]

## 2017-12-27 NOTE — Progress Notes (Signed)
Patients visitor at bedside screamed at staff and waved her fingers in their face due to frustration over pt needing to be cleaned up from an accident (that staff was not told about) for about 30 minutes.  Visitor then proceeded to ignore staffs request to allow staff to transfer pt and transferred pt, who is currently on heparin gtt, from chair to bed and back.  Visitor left, patient remains pleasant and cooperative; though with frequent BM.

## 2017-12-27 NOTE — Progress Notes (Signed)
ANTICOAGULATION CONSULT NOTE   Pharmacy Consult for Heparin Indication: chest pain/ACS  No Known Allergies  Patient Measurements: Height: 5\' 4"  (162.6 cm) Weight: 151 lb 3.2 oz (68.6 kg)(scale b) IBW/kg (Calculated) : 54.7 HEPARIN DW (KG): 68.4   Vital Signs: Temp: 97.4 F (36.3 C) (08/04 1308) Temp Source: Oral (08/04 1308) BP: 121/71 (08/04 1308) Pulse Rate: 64 (08/04 1308)  Labs: Recent Labs    12/26/17 1203  12/26/17 2228 12/27/17 0019 12/27/17 0654 12/27/17 1334 12/27/17 1539  HGB 11.3*  --   --   --  10.6*  --   --   HCT 34.9*  --   --   --  33.6*  --   --   PLT 218  --   --   --  204  --   --   HEPARINUNFRC  --   --  0.26*  --  0.33  --  0.27*  CREATININE 4.20*  --   --   --  6.54*  --   --   TROPONINI  --    < >  --  0.03* 0.04* 0.03*  --    < > = values in this interval not displayed.    Estimated Creatinine Clearance: 8.2 mL/min (A) (by C-G formula based on SCr of 6.54 mg/dL (H)).   Medical History: Past Medical History:  Diagnosis Date  . Anemia   . Anxiety   . Arthritis   . Benign hypertension   . Bipolar disorder ([place])   . CHF (congestive heart failure) ([place])   . Chronic kidney disease, stage V [facility])    Nephrologist is with [facility] (Dr. Tammi Klippel)  . Coronary artery disease   . Depression   . Diabetes mellitus without complication ([place])   . DVT (deep venous thrombosis) ([facility])    right lower leg  . GERD (gastroesophageal reflux disease)   . Heart murmur   . History of blood transfusion   . History of bronchitis   . History of pneumonia   . Shortness of breath dyspnea   . Sleep apnea   . Type 2 diabetes mellitus ([place])     Assessment: Robin [name] is a 67 y.o. F admitted for chest pain. Pharmacy consulted to manage heparin. Initial CBC WNL, no overt s/sx of bleeding. No PTA anticoagulation.   Heparin level at 1600 was 0.27 which is subtherapeutic. Will adjust heparin.   Goal of Therapy:  Heparin level 0.3-0.7 units/ml Monitor  platelets by anticoagulation protocol: Yes   Plan: Heparin 1000 units bolus  Increase heparin to 1050 units/hr Heparin level in 6 hours Monitor CBC, s/sx bleeding  Triniti Gruetzmacher A. Levada Dy, PharmD, [place] Pager: [phone] Please utilize Amion for appropriate phone number to reach the unit pharmacist ([place])    12/27/2017   4:52 PM

## 2017-12-28 ENCOUNTER — Encounter (HOSPITAL_COMMUNITY): Payer: Non-veteran care

## 2017-12-28 ENCOUNTER — Encounter (HOSPITAL_COMMUNITY)
Admission: EM | Disposition: A | Discharge status: Home Health | Payer: Self-pay | Source: Ambulatory Visit | Attending: Internal Medicine

## 2017-12-28 DIAGNOSIS — E785 Hyperlipidemia, unspecified: Secondary | ICD-10-CM

## 2017-12-28 DIAGNOSIS — I25118 Atherosclerotic heart disease of native coronary artery with other forms of angina pectoris: Secondary | ICD-10-CM

## 2017-12-28 DIAGNOSIS — I10 Essential (primary) hypertension: Secondary | ICD-10-CM

## 2017-12-28 DIAGNOSIS — D539 Nutritional anemia, unspecified: Secondary | ICD-10-CM

## 2017-12-28 DIAGNOSIS — I25110 Atherosclerotic heart disease of native coronary artery with unstable angina pectoris: Principal | ICD-10-CM

## 2017-12-28 DIAGNOSIS — Z955 Presence of coronary angioplasty implant and graft: Secondary | ICD-10-CM

## 2017-12-28 HISTORY — PX: LEFT HEART CATH AND CORONARY ANGIOGRAPHY: CATH118249

## 2017-12-28 LAB — RENAL FUNCTION PANEL
Albumin: 3.1 g/dL — ABNORMAL LOW (ref 3.5–5.0)
Anion gap: 15 (ref 5–15)
BUN: 59 mg/dL — ABNORMAL HIGH (ref 8–23)
CO2: 26 mmol/L (ref 22–32)
Calcium: 9.5 mg/dL (ref 8.9–10.3)
Chloride: 91 mmol/L — ABNORMAL LOW (ref 98–111)
Creatinine, Ser: 7.89 mg/dL — ABNORMAL HIGH (ref 0.44–1.00)
GFR calc Af Amer: 6 mL/min — ABNORMAL LOW (ref >60)
GFR calc non Af Amer: 5 mL/min — ABNORMAL LOW (ref >60)
Glucose, Bld: 208 mg/dL — ABNORMAL HIGH (ref 70–99)
Phosphorus: 7.6 mg/dL — ABNORMAL HIGH (ref 2.5–4.6)
Potassium: 5.2 mmol/L — ABNORMAL HIGH (ref 3.5–5.1)
Sodium: 132 mmol/L — ABNORMAL LOW (ref 135–145)

## 2017-12-28 LAB — GLUCOSE, CAPILLARY
GLUCOSE-CAPILLARY: 172 mg/dL — AB (ref 70–99)
GLUCOSE-CAPILLARY: 73 mg/dL (ref 70–99)
GLUCOSE-CAPILLARY: 108 mg/dL — AB (ref 70–99)
GLUCOSE-CAPILLARY: 269 mg/dL — AB (ref 70–99)
Glucose-Capillary: 243 mg/dL — ABNORMAL HIGH (ref 70–99)
Glucose-Capillary: 65 mg/dL — ABNORMAL LOW (ref 70–99)

## 2017-12-28 LAB — CBC
HEMATOCRIT: 30 % — AB (ref 36.0–46.0)
Hemoglobin: 9.7 g/dL — ABNORMAL LOW (ref 12.0–15.0)
MCH: 33.8 pg (ref 26.0–34.0)
MCHC: 32.3 g/dL (ref 30.0–36.0)
MCV: 104.5 fL — ABNORMAL HIGH (ref 78.0–100.0)
PLATELETS: 194 10*3/uL (ref 150–400)
RBC: 2.87 MIL/uL — ABNORMAL LOW (ref 3.87–5.11)
RDW: 14.9 % (ref 11.5–15.5)
WBC: 7.1 10*3/uL (ref 4.0–10.5)

## 2017-12-28 LAB — POCT ACTIVATED CLOTTING TIME: Activated Clotting Time: 147 seconds

## 2017-12-28 LAB — HEPARIN LEVEL (UNFRACTIONATED)
HEPARIN UNFRACTIONATED: 0.46 [IU]/mL (ref 0.30–0.70)
Heparin Unfractionated: 0.33 IU/mL (ref 0.30–0.70)

## 2017-12-28 LAB — TROPONIN I
Troponin I: 0.03 ng/mL (ref <0.03)
Troponin I: 0.03 ng/mL (ref <0.03)

## 2017-12-28 SURGERY — LEFT HEART CATH AND CORONARY ANGIOGRAPHY
Anesthesia: LOCAL

## 2017-12-28 MED ORDER — LIDOCAINE HCL (PF) 1 % IJ SOLN
INTRAMUSCULAR | Status: AC
  Filled 2017-12-28: qty 30

## 2017-12-28 MED ORDER — CALCITRIOL 1 MCG/ML IV SOLN
INTRAVENOUS | Status: AC
  Filled 2017-12-28: qty 1

## 2017-12-28 MED ORDER — ISOSORBIDE MONONITRATE ER 60 MG PO TB24
90.0000 mg | ORAL_TABLET | Freq: Every day | ORAL | Status: DC
  Administered 2017-12-29: 90 mg via ORAL
  Filled 2017-12-28 (×2): qty 1

## 2017-12-28 MED ORDER — FENTANYL CITRATE (PF) 100 MCG/2ML IJ SOLN
INTRAMUSCULAR | Status: AC
  Filled 2017-12-28: qty 2

## 2017-12-28 MED ORDER — CHLORHEXIDINE GLUCONATE CLOTH 2 % EX PADS
6.0000 | MEDICATED_PAD | Freq: Every day | CUTANEOUS | Status: DC
  Administered 2017-12-29: 6 via TOPICAL

## 2017-12-28 MED ORDER — LIDOCAINE HCL (PF) 1 % IJ SOLN
INTRAMUSCULAR | Status: DC | PRN
  Administered 2017-12-28: 30 mL

## 2017-12-28 MED ORDER — CALCITRIOL 0.25 MCG PO CAPS
ORAL_CAPSULE | ORAL | Status: AC
  Filled 2017-12-28: qty 1

## 2017-12-28 MED ORDER — DICLOFENAC SODIUM 1 % TD GEL
2.0000 g | Freq: Four times a day (QID) | TRANSDERMAL | Status: DC | PRN
  Filled 2017-12-28: qty 100

## 2017-12-28 MED ORDER — IOHEXOL 350 MG/ML SOLN
INTRAVENOUS | Status: DC | PRN
  Administered 2017-12-28: 120 mL via INTRA_ARTERIAL

## 2017-12-28 MED ORDER — MIDAZOLAM HCL 2 MG/2ML IJ SOLN
INTRAMUSCULAR | Status: AC
  Filled 2017-12-28: qty 2

## 2017-12-28 MED ORDER — MIDAZOLAM HCL 2 MG/2ML IJ SOLN
INTRAMUSCULAR | Status: DC | PRN
  Administered 2017-12-28 (×2): 1 mg via INTRAVENOUS

## 2017-12-28 MED ORDER — CALCITRIOL 0.5 MCG PO CAPS
ORAL_CAPSULE | ORAL | Status: AC
  Filled 2017-12-28: qty 2

## 2017-12-28 MED ORDER — SODIUM CHLORIDE 0.9% FLUSH: 3.0000 mL | INTRAVENOUS | Status: DC | PRN

## 2017-12-28 MED ORDER — CALCITRIOL 0.5 MCG PO CAPS: 1.0000 ug | ORAL_CAPSULE | ORAL | Status: DC

## 2017-12-28 MED ORDER — HEPARIN (PORCINE) IN NACL 1000-0.9 UT/500ML-% IV SOLN
INTRAVENOUS | Status: DC | PRN
  Administered 2017-12-28 (×2): 500 mL

## 2017-12-28 MED ORDER — HEPARIN (PORCINE) IN NACL 1000-0.9 UT/500ML-% IV SOLN
INTRAVENOUS | Status: AC
  Filled 2017-12-28: qty 1000

## 2017-12-28 MED ORDER — SODIUM CHLORIDE 0.9% FLUSH
3.0000 mL | Freq: Two times a day (BID) | INTRAVENOUS | Status: DC
  Administered 2017-12-28 – 2017-12-29 (×2): 3 mL via INTRAVENOUS

## 2017-12-28 MED ORDER — SODIUM CHLORIDE 0.9 % IV SOLN: 250.0000 mL | INTRAVENOUS | Status: DC | PRN

## 2017-12-28 MED ORDER — FENTANYL CITRATE (PF) 100 MCG/2ML IJ SOLN
INTRAMUSCULAR | Status: DC | PRN
  Administered 2017-12-28 (×2): 25 ug via INTRAVENOUS

## 2017-12-28 SURGICAL SUPPLY — 9 items
CATH INFINITI 5FR MULTPACK ANG (CATHETERS) ×2 IMPLANT
CATH LAUNCHER 5F EBU3.5 (CATHETERS) ×2 IMPLANT
KIT HEART LEFT (KITS) ×2 IMPLANT
PACK CARDIAC CATHETERIZATION (CUSTOM PROCEDURE TRAY) ×2 IMPLANT
SHEATH PINNACLE 5F 10CM (SHEATH) ×2 IMPLANT
SYR MEDRAD MARK V 150ML (SYRINGE) ×2 IMPLANT
TRANSDUCER W/STOPCOCK (MISCELLANEOUS) ×2 IMPLANT
TUBING CIL FLEX 10 FLL-RA (TUBING) ×2 IMPLANT
WIRE EMERALD 3MM-J .035X150CM (WIRE) ×2 IMPLANT

## 2017-12-28 NOTE — Progress Notes (Signed)
1730 Pt back from cath lab. Pt A&Ox4, right groin dressing CDI, level 0. Dgt at bedside. Reviewed post cath instructions with pt and dgt. Dgt understands without assistance. Blood sugar checked, low, snack provided, blood sugar rechecked and returned to normal limits. Pt assisted with bedpan. Updated with POC. WCTM.

## 2017-12-28 NOTE — Progress Notes (Signed)
ANTICOAGULATION CONSULT NOTE  Pharmacy Consult for Heparin Indication: chest pain/ACS  No Known Allergies  Patient Measurements: Height: 5\' 4"  (162.6 cm) Weight: 151 lb 3.2 oz (68.6 kg)(scale b) IBW/kg (Calculated) : 54.7 HEPARIN DW (KG): 68.4   Vital Signs: Temp: 98.2 F (36.8 C) (08/04 1944) Temp Source: Oral (08/04 1944) BP: 118/54 (08/04 2210) Pulse Rate: 60 (08/04 2210)  Labs: Recent Labs    12/26/17 1203  12/27/17 0654 12/27/17 1334 12/27/17 1539 12/27/17 2022 12/27/17 2346  HGB 11.3*  --  10.6*  --   --   --  9.7*  HCT 34.9*  --  33.6*  --   --   --  30.0*  PLT 218  --  204  --   --   --  194  HEPARINUNFRC  --    < > 0.33  --  0.27*  --  0.46  CREATININE 4.20*  --  6.54*  --   --   --   --   TROPONINI  --    < > 0.04* 0.03*  --  0.03*  --    < > = values in this interval not displayed.    Estimated Creatinine Clearance: 8.2 mL/min (A) (by C-G formula based on SCr of 6.54 mg/dL (H)).   Assessment: 67 y.o. female with chest pain for heparin   Goal of Therapy:  Heparin level 0.3-0.7 units/ml Monitor platelets by anticoagulation protocol: Yes   Plan: Continue Heparin at current rate   Phillis Knack, PharmD, BCPS   12/28/2017   12:26 AM

## 2017-12-28 NOTE — Progress Notes (Signed)
Pt had 10 beat run of vtach. Pt is asymptomatic. MD notified. No new orders.

## 2017-12-28 NOTE — Progress Notes (Signed)
Pt leaves cath lab holding area in stable condition. Rt groin is CDI, no hematoma or complications. Pt understands post sheath pull instructions.

## 2017-12-28 NOTE — Progress Notes (Signed)
Inpatient Diabetes Program Recommendations  AACE/ADA: New Consensus Statement on Inpatient Glycemic Control (2015)  Target Ranges:  Prepandial:   less than 140 mg/dL      Peak postprandial:   less than 180 mg/dL (1-2 hours)      Critically ill patients:  140 - 180 mg/dL   Lab Results  Component Value Date   GLUCAP 243 (H) 12/28/2017   HGBA1C 6.7 (H) 09/09/2016    Review of Glycemic Control Results for Robin [name], Robin [name] (MRN [id number]) as of 12/28/2017 10:39  Ref. Range 12/27/2017 11:32 12/27/2017 16:46 12/27/2017 21:09 12/28/2017 06:42  Glucose-Capillary Latest Ref Range: 70 - 99 mg/dL 203 (H) 265 (H) 216 (H) 243 (H)   Diabetes history: Type 2 DM Outpatient Diabetes medications: Levemir 25 units QAM, 40 units qPM, Humalog 3-15 units TID Current orders for Inpatient glycemic control: Novolog 0-9 units TID, Novolog 0-5 units QHS, Levemir 6 units QD  Inpatient Diabetes Program Recommendations:    Of note last A1C was 6.7% from 08/2016, consider repeating A1C?  Additionally, patient is on a total of 65 units of Levemir at home. AM FSBS was 243 mg/dL, consider increasing Levemir to 14 units QD.   Thanks, Bronson Curb, MSN, RNC-OB Diabetes Coordinator [phone] (8a-5p)

## 2017-12-28 NOTE — Progress Notes (Signed)
[place] Report received from Judson Roch, [place]. Pt down in cath lab.

## 2017-12-28 NOTE — Progress Notes (Signed)
Subjective: Patient was seen in dialysis this morning. She reports that she slept well last night with no acute events. She denies any chest pain or abdominal pain today. She denies any fevers, chills or headaches. We talked about the plan for catheterization for today and patient was in agreement.   Objective:  Vital signs in last 24 hours: Vitals:   12/27/17 1308 12/27/17 1944 12/27/17 2210 12/28/17 0340  BP: 121/71 (!) 144/58 (!) 118/54 122/61  Pulse: 64 82 60 (!) 55  Resp: 20 16  18   Temp: (!) 97.4 F (36.3 C) 98.2 F (36.8 C)  98 F (36.7 C)  TempSrc: Oral Oral  Oral  SpO2: 98% 95%  96%  Weight:    154 lb 6.4 oz (70 kg)  Height:       General: Well appearing, well developed, no acute distress HEENT: Normocephalic, atraumatic, midline trachea Cardiac: RRR, no murmurs, rubs or gallops, no tenderness to palpation on chest wall Pulmonary: Lungs CTA bilaterally, no wheezing, rhonchi or rales Abdomen: Soft, non-tender, + bowel sounds, no masses  Extremity: No LE edema, dialysis graft in left arm in use, no lesions Neuro: Alert and oriented, PERRLA, moves all extremities Psychiatry: Normal mood and affect   Assessment/Plan: This is a 67 year old female with a  PMG of CHF (EF 3/18 showed EF 50-55% with grade 2 diastolic dysfunction),ESRD, HTN, DM, GERD, and bipolar disorder presenting with acute chest pain. Admitted for unstable angina.   NSTEMI: Patient presented with atypical angina that is not worsened with exertion or relieved with rest but relieved with nitroglycerin. She has been having this for a couple years now and reported that is has been increasing in the past couple of months. She has been having multiple symptoms including diaphoresis, chest pain, SOB, and left arm pain that can occur with or without the chest pain but often become worse with dialysis so she hasn't been completing most of her treatments. On admission she had tenderness to palpation in the middle of her  chest and the epigastric area. Her initial EKG showed ST depression in leads II, III, and aVF. Cardiology evaluated her and planned a catheterization on Monday. Repeat EKG showed dynamic changes with NSR, occasional PVCs and no ST depressions. Troponin levels were 0.04, 0.03, and 0.03. There is some concern that this chest pain is cardiac in nature given the dynamic changes on EKG, improvement with nitroglycerin, and increased troponin levels. Cardiology is on board. It's also possible that this may be musculoskeletal since there was pain to palpation on admission. The other possibility is that this is related to her GERD since there was also epigastric tenderness, however she denied that it was worse when eating.  -Catheterization today -Telemetry -Heparin drip -ASA 81mg   Macrocytic anemia:Hgb 11.3, MCV 107.1. Patient is near her baseline Hgb. Yest  Acute on chronic kidney disease: ESRD, on dialysis Tuesday, Thursday, and Saturday. BMP yesterday showed K 5.3, BUN 40 and Cr 6.54. Nephrology evaluated and planned for dialysis Monday.  -Dialysis today -BMP in AM  Heart failure:EF in 2011 showed 35-40%, last echo in 3/18 showed EF 50-55% and grade 2 diastolic dysfunction.  -On hydralazine, lipitor,metoprolol, and imdur at home -Continue home medications  YTK:ZSWFUXN was normotensive on admission. She is on metoprolol, and hydralazine at home. -Continue home medications  Diabetes mellitus type 2:Patients glucose on admission was 331. She is on humalog TID with meals, and levemir 25 in AM and 40 in PM at home.  -SSI-sensitive -  Levemir 14 units -CBG q4 hour  Bipolar disorder: Is on aripiprazole at home. Continue home medications. GERD:Patient is on omeprazole at home. Continuepantoprazole.  FEN:No fluids, replete lytes prn,Renaldiet or  VTE WPT:YYPEJYL Code Status: FULL  Dispo: Anticipated discharge in approximately 2 day(s).   Asencion Noble, MD 12/28/2017, 6:13  AM Pager: [phone]

## 2017-12-28 NOTE — Progress Notes (Signed)
ANTICOAGULATION CONSULT NOTE   Pharmacy Consult for Heparin Indication: chest pain/ACS  No Known Allergies  Patient Measurements: Height: 5\' 4"  (162.6 cm) Weight: 154 lb 12.2 oz (70.2 kg) IBW/kg (Calculated) : 54.7 HEPARIN DW (KG): 68.4   Vital Signs: Temp: 98.2 F (36.8 C) (08/05 0754) Temp Source: Oral (08/05 0754) BP: 137/70 (08/05 0900) Pulse Rate: 84 (08/05 0900)  Labs: Recent Labs    12/26/17 1203  12/27/17 0654 12/27/17 1334 12/27/17 1539 12/27/17 2022 12/27/17 2346 12/28/17 0540 12/28/17 0826  HGB 11.3*  --  10.6*  --   --   --  9.7*  --   --   HCT 34.9*  --  33.6*  --   --   --  30.0*  --   --   PLT 218  --  204  --   --   --  194  --   --   HEPARINUNFRC  --    < > 0.33  --  0.27*  --  0.46 0.33  --   CREATININE 4.20*  --  6.54*  --   --   --   --   --  7.89*  TROPONINI  --    < > 0.04* 0.03*  --  0.03*  --  0.03*  --    < > = values in this interval not displayed.    Estimated Creatinine Clearance: 6.8 mL/min (A) (by C-G formula based on SCr of 7.89 mg/dL (H)).  Assessment: Robin [name] is a 67 y.o. F admitted for chest pain. Pharmacy consulted to manage heparin. Initial CBC WNL, no overt s/sx of bleeding. No PTA anticoagulation.   Heparin level this morning remains therapeutic (HL 0.33, goal of 0.3-0.7). Hgb/Hct slight drop, plts wnl.   Goal of Therapy:  Heparin level 0.3-0.7 units/ml Monitor platelets by anticoagulation protocol: Yes   Plan:  - Continue Heparin at 900 units/hr (9 ml/hr) - Possible cath later today - will follow-up anticoagulation plans post-op - Will continue to follow renal function, culture results, LOT, and antibiotic de-escalation plans   Thank you for allowing pharmacy to be a part of this patient's care.  Alycia Rossetti, PharmD, BCPS Clinical Pharmacist Pager: [phone] Clinical phone for 12/28/2017 from 7a-3:30p: [phone] If after 3:30p, please call main pharmacy at: [phone] Please check AMION for all [place]  numbers 12/28/2017 10:04 AM

## 2017-12-28 NOTE — Progress Notes (Signed)
Medicine attending: I examined this patient today together with resident physician Dr. Lonia Skinner and I concur with her evaluation and management plan which we discussed together.  67 year old woman with end-stage renal disease on dialysis.  She has known coronary artery disease with prior single-vessel coronary angioplasty with stent placement over 5 years ago.  Most of her medical care provided by the [facility] and complete records not available.  She presented on the day of admission August 3 with a 3-72-month history of intermittent chest pain radiating to her left arm with additional associated abdominal cramping pain which seem to occur primarily at time of dialysis procedures.  Initial EKG showed ST segment depressions and T wave inversions in the inferolateral leads new compared to 12/2016 tracing.  She was felt to have a acute coronary syndrome.  She was started on a heparin infusion.  Chest symptoms resolved.  Troponin levels have not been elevated.  However, given her history she is felt to be at high risk and cardiac catheterization is planned for today. She is currently comfortable.  Regular cardiac rhythm without gallop.  No JVD.  Lungs overall clear.  No peripheral edema or calf tenderness. She is hemodynamically stable to proceed with planned cardiac catheterization today.

## 2017-12-28 NOTE — Plan of Care (Signed)
  Problem: Education: Goal: Knowledge of General Education information will improve Description Including pain rating scale, medication(s)/side effects and non-pharmacologic comfort measures Outcome: Progressing   Problem: Health Behavior/Discharge Planning: Goal: Ability to manage health-related needs will improve Outcome: Progressing   Problem: Clinical Measurements: Goal: Ability to maintain clinical measurements within normal limits will improve Outcome: Progressing   Problem: Activity: Goal: Risk for activity intolerance will decrease Outcome: Progressing   Problem: Nutrition: Goal: Adequate nutrition will be maintained Outcome: Progressing   Problem: Coping: Goal: Level of anxiety will decrease Outcome: Progressing   Problem: Activity: Goal: Capacity to carry out activities will improve Outcome: Progressing

## 2017-12-28 NOTE — Plan of Care (Signed)
  Problem: Activity: Goal: Risk for activity intolerance will decrease Outcome: Progressing   Problem: Nutrition: Goal: Adequate nutrition will be maintained Outcome: Progressing   Problem: Coping: Goal: Level of anxiety will decrease Outcome: Progressing   

## 2017-12-28 NOTE — Progress Notes (Signed)
Site area: right groin fa sheath pulled and pressure held by tammy Mink Site Prior to Removal:  Level 0 Pressure Applied For: 20 minutes Manual:   yes Patient Status During Pull:  stable Post Pull Site:  Level 0 Post Pull Instructions Given:  yes Post Pull Pulses Present: rt dp palpable Dressing Applied:  Gauze and tegaderm Bedrest begins @ 1700 Comments:

## 2017-12-28 NOTE — Progress Notes (Signed)
RN rounded on pt. Pt states she does not need anything at this time. RN informed pt cath lab will pick her up between 1430-1500.

## 2017-12-28 NOTE — Progress Notes (Addendum)
[facility] Progress Note   Subjective: Seen on HD, no complaints. Has ran full treatment today. Praised and encouraged to continue.   Objective Vitals:   12/28/17 1100 12/28/17 1130 12/28/17 1140 12/28/17 1251  BP: 139/60 (!) 148/68 (!) 148/68 (!) 143/72  Pulse: 70 85 85 72  Resp: 13  13   Temp:   98.4 F (36.9 C) 98.5 F (36.9 C)  TempSrc:   Oral Oral  SpO2:    95%  Weight:   69.6 kg (153 lb 7 oz)   Height:       Physical Exam General: Elderly female in NAD Heart: S1,S2. RRR. SR on monitor.  Lungs: CTAB Abdomen: Active BS Extremities: No LE edema Dialysis Access: LUA AVF cannulated. No issues with AVF.    Additional Objective Labs: Basic Metabolic Panel: Recent Labs  Lab 12/26/17 1203 12/27/17 0654 12/28/17 0826  NA 135 135 132*  K 4.6 5.3* 5.2*  CL 90* 90* 91*  CO2 31 27 26   GLUCOSE 331* 155* 208*  BUN 24* 40* 59*  CREATININE 4.20* 6.54* 7.89*  CALCIUM 9.2 10.1 9.5  PHOS  --   --  7.6*   Liver Function Tests: Recent Labs  Lab 12/28/17 0826  ALBUMIN 3.1*   No results for input(s): LIPASE, AMYLASE in the last 168 hours. CBC: Recent Labs  Lab 12/26/17 1203 12/27/17 0654 12/27/17 2346  WBC 13.0* 7.5 7.1  HGB 11.3* 10.6* 9.7*  HCT 34.9* 33.6* 30.0*  MCV 107.1* 105.3* 104.5*  PLT 218 204 194   Blood Culture    Component Value Date/Time   SDES BLOOD RIGHT ANTECUBITAL 08/07/2016 2127   SPECREQUEST BOTTLES DRAWN AEROBIC AND ANAEROBIC 5 CC 08/07/2016 2127   CULT  08/07/2016 2127    NO GROWTH 5 DAYS Performed at [facility] [address] [place],  [address]    REPTSTATUS 08/13/2016 FINAL 08/07/2016 2127    Cardiac Enzymes: Recent Labs  Lab 12/27/17 0019 12/27/17 0654 12/27/17 1334 12/27/17 2022 12/28/17 0540  TROPONINI 0.03* 0.04* 0.03* 0.03* 0.03*   CBG: Recent Labs  Lab 12/27/17 1132 12/27/17 1646 12/27/17 2109 12/28/17 0642 12/28/17 1247  GLUCAP 203* 265* 216* 243* 172*   Iron Studies: No  results for input(s): IRON, TIBC, TRANSFERRIN, FERRITIN in the last 72 hours. @lablastinr3 @ Studies/Results: No results found. Medications: . sodium chloride    . sodium chloride 10 mL/hr at 12/28/17 0559  . heparin 1,050 Units/hr (12/28/17 1256)   . ARIPiprazole  7.5 mg Oral Daily  . aspirin  81 mg Oral Daily  . atorvastatin  20 mg Oral QHS  . calcitRIOL  1 mcg Oral Q M,W,F-HD  . Chlorhexidine Gluconate Cloth  6 each Topical Q0600  . docusate sodium  100 mg Oral Daily  . feeding supplement (NEPRO CARB STEADY)  237 mL Oral BID BM  . gabapentin  200 mg Oral QHS   And  . gabapentin  100 mg Oral Daily  . hydrALAZINE  25 mg Oral Daily  . insulin aspart  0-5 Units Subcutaneous QHS  . insulin aspart  0-9 Units Subcutaneous TID WC  . insulin detemir  6 Units Subcutaneous Daily  . [START ON 12/29/2017] isosorbide mononitrate  90 mg Oral Daily  . metoprolol tartrate  50 mg Oral BID  . multivitamin  1 tablet Oral QHS  . pantoprazole  20 mg Oral Daily  . ramelteon  8 mg Oral QHS  . sevelamer carbonate  1,600 mg Oral TID WC  .  sodium chloride flush  3 mL Intravenous Q12H     Dialysis Orders: [place] T,Th,S 3h 36min   68kg   2/2 bath  Hep 2000  LUA AVF -Mircera 75 mcg IV q 2 weeks (last dose 12/24/17 last HGB 10.9 12/24/17) -Venofer 50 mg IV weekly (last dose 12/24/2017 Last Fe 128 Tsat 46% 12/17/17) -Calcitriol 1.25 mcg PO TIW (last PTH 241 12/17/17)   Assessment/Plan: 1.  Unstable Angina-per primary/cardiology. Cardiac cath today.  2.  Abdominal pain-per primary. No C/Os today.  3.  Pain in AVF-no evidence of steal syndrome, AFs in HD unit has been stable. No compelling evidence for immediate work up now-can follow up with [facility] as OP for fistulagram unless pain worsens acutely.  4.  ESRD - T,Th,S via LUA AVF. Truncated HD 12/26/17. HD today off schedule. HD tomorrow to get back on schedule No heparin-on heparin gtt.  5.  Hypertension/volume  - BP well controlled. On hydralazine, Imdur,  metoprolol. UFG 1.0 liter.  6.  Anemia  - HGB 11.3 on adm, 10.6 today. Recent ESA dose. Follow trend.  7.  Metabolic bone disease - Phos 7.6 . Ca 9.5 C Ca 10.2  Continue binders, decrease VDRA.  8.  Nutrition - Albumin 3.1. Renal carb mod diet, renal vit, nepro. 9.  DM-per primary.    Rita H. Brown NP-C 12/28/2017, 2:05 PM  [facility] [phone]  Pt seen, examined and agree w A/P as above.  Kelly Splinter MD [employer] pager [phone]   12/28/2017, 2:06 PM

## 2017-12-28 NOTE — Progress Notes (Addendum)
Progress Note  [name] Date of Encounter: 12/28/2017  Primary Cardiologist: Sinclair Grooms, MD   Subjective   Seen at HD session. For cath later today.   Inpatient Medications    Scheduled Meds: . ARIPiprazole  7.5 mg Oral Daily  . aspirin  81 mg Oral Daily  . atorvastatin  20 mg Oral QHS  . calcitRIOL  1.25 mcg Oral Q M,W,F-HD  . Chlorhexidine Gluconate Cloth  6 each Topical Q0600  . docusate sodium  100 mg Oral Daily  . feeding supplement (NEPRO CARB STEADY)  237 mL Oral BID BM  . gabapentin  200 mg Oral QHS   And  . gabapentin  100 mg Oral Daily  . hydrALAZINE  25 mg Oral Daily  . insulin aspart  0-5 Units Subcutaneous QHS  . insulin aspart  0-9 Units Subcutaneous TID WC  . insulin detemir  6 Units Subcutaneous Daily  . isosorbide mononitrate  60 mg Oral Daily  . metoprolol tartrate  50 mg Oral BID  . multivitamin  1 tablet Oral QHS  . pantoprazole  20 mg Oral Daily  . ramelteon  8 mg Oral QHS  . sevelamer carbonate  1,600 mg Oral TID WC  . sodium chloride flush  3 mL Intravenous Q12H   Continuous Infusions: . sodium chloride    . sodium chloride 10 mL/hr at 12/28/17 0559  . heparin 1,050 Units/hr (12/27/17 1805)   PRN Meds: sodium chloride, acetaminophen **OR** acetaminophen, polyethylene glycol, senna-docusate, sodium chloride flush   Vital Signs    Vitals:   12/28/17 0748 12/28/17 0754 12/28/17 0801 12/28/17 0810  BP: (!) (P) 162/62 (!) (P) 160/55 (!) (P) 143/81 (P) 138/85  Pulse: 69 (P) 64 (P) 70 (P) 69  Resp: 18 (P) 18    Temp: 98.4 F (36.9 C) (P) 98.2 F (36.8 C)    TempSrc: Oral (P) Oral    SpO2: (P) 99% (P) 98%    Weight: 154 lb 12.2 oz (70.2 kg)     Height:        Intake/Output Summary (Last 24 hours) at 12/28/2017 0851 Last data filed at 12/28/2017 0500 Gross per 24 hour  Intake 1282.47 ml  Output 3 ml  Net 1279.47 ml   Filed Weights   12/27/17 0624 12/28/17 0340 12/28/17 0748  Weight: 151 lb 3.2 oz (68.6 kg)  154 lb 6.4 oz (70 kg) 154 lb 12.2 oz (70.2 kg)    Telemetry    Sr with PVCs - Personally Reviewed  ECG    N/A  Physical Exam   GEN: No acute distress.   Neck: No JVD Cardiac: RRR, no murmurs, rubs, or gallops.  Respiratory: Clear to auscultation bilaterally. GI: Soft, nontender, non-distended  MS: No edema; No deformity. Neuro:  Nonfocal  Psych: Normal affect   Labs    Chemistry Recent Labs  Lab 12/26/17 1203 12/27/17 0654 12/28/17 0826  NA 135 135 132*  K 4.6 5.3* 5.2*  CL 90* 90* 91*  CO2 31 27 26   GLUCOSE 331* 155* 208*  BUN 24* 40* 59*  CREATININE 4.20* 6.54* 7.89*  CALCIUM 9.2 10.1 9.5  ALBUMIN  --   --  3.1*  GFRNONAA 10* 6* 5*  GFRAA 12* 7* 6*  ANIONGAP 14 18* 15     Hematology Recent Labs  Lab 12/26/17 1203 12/27/17 0654 12/27/17 2346  WBC 13.0* 7.5 7.1  RBC 3.26* 3.19* 2.87*  HGB 11.3* 10.6* 9.7*  HCT 34.9* 33.6*  30.0*  MCV 107.1* 105.3* 104.5*  MCH 34.7* 33.2 33.8  MCHC 32.4 31.5 32.3  RDW 15.1 15.0 14.9  PLT 218 204 194    Cardiac Enzymes Recent Labs  Lab 12/27/17 0654 12/27/17 1334 12/27/17 2022 12/28/17 0540  TROPONINI 0.04* 0.03* 0.03* 0.03*    Recent Labs  Lab 12/26/17 1225  TROPIPOC 0.01     BNPNo results for input(s): BNP, PROBNP in the last 168 hours.   DDimer No results for input(s): DDIMER in the last 168 hours.   Radiology    Dg Chest 2 View  Result Date: 12/26/2017 CLINICAL DATA:  Chest pain and shortness of breath since yesterday. EXAM: CHEST - 2 VIEW COMPARISON:  Chest x-ray dated 11/11/2016. FINDINGS: Heart size and mediastinal contours are stable. Lungs are clear. No pleural effusion or pneumothorax seen. No acute or suspicious osseous finding. IMPRESSION: No active cardiopulmonary disease. No evidence of pneumonia or pulmonary edema. Electronically Signed   By: Franki Cabot M.D.   On: 12/26/2017 12:35    Cardiac Studies    Pending cath today.   Patient Profile     67 y.o. female with hx of CAD,  ESRD on HD, HTN, DM, DVT, OSA. CKD stage IV and bipolar disorder admitted with unstable angina.   Cardiac catheterization report 2011 -Dr. Durwin Nora Little-prior [place] Dr. Tamala Julian 2004 - Ostial ramus stent patent, 50% RCA and LAD disease.  Assessment & Plan    1. Unstable angina with hx of CAD  - Troponin 0.03 x 3. EKG shows ST segment depression fairly diffusely with T wave inversion Concerning for global ischemia. Continue ASA, statin, Imdur, BB and statin.   2. ESRD on HD - seen during dialysis  For questions or updates, please contact [place] Please consult [email or web address] for contact info under Cardiology/STEMI.   Signed, Leanor Kail, PA  12/28/2017, 8:51 AM     The patient was seen, examined and discussed with Bhagat,Bhavinkumar PA-C and I agree with the above.   Currently in HD prior to the cath scheduled for later today, asymptomatic. Hypertensive, will increase imdur to 90 mg po daily.  Ena Dawley, MD 12/28/2017

## 2017-12-28 NOTE — Progress Notes (Signed)
Pt ate breakfast at 0900. Pt has not eaten since then.

## 2017-12-28 NOTE — Progress Notes (Deleted)
 [facility] Progress Note   Subjective: Seen on HD, no complaints. Has ran full treatment today. Praised and encouraged to continue.   Objective Vitals:   12/28/17 0801 12/28/17 0810 12/28/17 0830 12/28/17 0900  BP: (!) 143/81 138/85 (!) 145/50 137/70  Pulse: 70 69 64 84  Resp: 18 18 16 14   Temp:      TempSrc:      SpO2:      Weight:      Height:       Physical Exam General: Elderly female in NAD Heart: S1,S2. RRR. SR on monitor.  Lungs: CTAB Abdomen: Active BS Extremities: No LE edema Dialysis Access: LUA AVF cannulated. No issues with AVF.    Additional Objective Labs: Basic Metabolic Panel: Recent Labs  Lab 12/26/17 1203 12/27/17 0654 12/28/17 0826  NA 135 135 132*  K 4.6 5.3* 5.2*  CL 90* 90* 91*  CO2 31 27 26   GLUCOSE 331* 155* 208*  BUN 24* 40* 59*  CREATININE 4.20* 6.54* 7.89*  CALCIUM 9.2 10.1 9.5  PHOS  --   --  7.6*   Liver Function Tests: Recent Labs  Lab 12/28/17 0826  ALBUMIN 3.1*   No results for input(s): LIPASE, AMYLASE in the last 168 hours. CBC: Recent Labs  Lab 12/26/17 1203 12/27/17 0654 12/27/17 2346  WBC 13.0* 7.5 7.1  HGB 11.3* 10.6* 9.7*  HCT 34.9* 33.6* 30.0*  MCV 107.1* 105.3* 104.5*  PLT 218 204 194   Blood Culture    Component Value Date/Time   SDES BLOOD RIGHT ANTECUBITAL 08/07/2016 2127   SPECREQUEST BOTTLES DRAWN AEROBIC AND ANAEROBIC 5 CC 08/07/2016 2127   CULT  08/07/2016 2127    NO GROWTH 5 DAYS Performed at [facility] [address] [place], [place] [address]    REPTSTATUS 08/13/2016 FINAL 08/07/2016 2127    Cardiac Enzymes: Recent Labs  Lab 12/27/17 0019 12/27/17 0654 12/27/17 1334 12/27/17 2022 12/28/17 0540  TROPONINI 0.03* 0.04* 0.03* 0.03* 0.03*   CBG: Recent Labs  Lab 12/27/17 0745 12/27/17 1132 12/27/17 1646 12/27/17 2109 12/28/17 0642  GLUCAP 143* 203* 265* 216* 243*   Iron Studies: No results for input(s): IRON, TIBC, TRANSFERRIN, FERRITIN in the last  72 hours. @lablastinr3 @ Studies/Results: Dg Chest 2 View  Result Date: 12/26/2017 CLINICAL DATA:  Chest pain and shortness of breath since yesterday. EXAM: CHEST - 2 VIEW COMPARISON:  Chest x-ray dated 11/11/2016. FINDINGS: Heart size and mediastinal contours are stable. Lungs are clear. No pleural effusion or pneumothorax seen. No acute or suspicious osseous finding. IMPRESSION: No active cardiopulmonary disease. No evidence of pneumonia or pulmonary edema. Electronically Signed   By: Franki Cabot M.D.   On: 12/26/2017 12:35   Medications: . sodium chloride    . sodium chloride 10 mL/hr at 12/28/17 0559  . heparin 1,050 Units/hr (12/27/17 1805)   . ARIPiprazole  7.5 mg Oral Daily  . aspirin  81 mg Oral Daily  . atorvastatin  20 mg Oral QHS  . calcitRIOL  1.25 mcg Oral Q M,W,F-HD  . Chlorhexidine Gluconate Cloth  6 each Topical Q0600  . docusate sodium  100 mg Oral Daily  . feeding supplement (NEPRO CARB STEADY)  237 mL Oral BID BM  . gabapentin  200 mg Oral QHS   And  . gabapentin  100 mg Oral Daily  . hydrALAZINE  25 mg Oral Daily  . insulin aspart  0-5 Units Subcutaneous QHS  . insulin aspart  0-9 Units Subcutaneous TID  WC  . insulin detemir  6 Units Subcutaneous Daily  . [START ON 12/29/2017] isosorbide mononitrate  90 mg Oral Daily  . metoprolol tartrate  50 mg Oral BID  . multivitamin  1 tablet Oral QHS  . pantoprazole  20 mg Oral Daily  . ramelteon  8 mg Oral QHS  . sevelamer carbonate  1,600 mg Oral TID WC  . sodium chloride flush  3 mL Intravenous Q12H     Dialysis Orders: [place],S 3 hr 45 min 180 NRe 400/800  68 kg 2.0 K/2.0 Ca  -Heparin 2000 units IV TIW -Mircera 75 mcg IV q 2 weeks (last dose 12/24/17 last HGB 10.9 12/24/17) -Venofer 50 mg IV weekly (last dose 12/24/2017 Last Fe 128 Tsat 46% 12/17/17) -Calcitriol 1.25 mcg PO TIW (last PTH 241 12/17/17)   Assessment/Plan: 1.  Unstable Angina-per primary/cardiology. Cardiac cath today.  2.  Abdominal  pain-per primary. No C/Os today.  3.  Pain in AVF-no evidence of steal syndrome, AFs in HD unit has been stable. No compelling evidence for immediate work up now-can follow up with [facility]VS as OP for fistulagram unless pain worsens acutely.  4.  ESRD -  MWF via LUA AVF. HD today on schedule. No heparin-on heparin gtt.  5.  Hypertension/volume  - BP well controlled. On hydralazine, Imdur, metoprolol. UFG 1.0 liter.  6.  Anemia  - HGB 11.3 on adm, 10.6 today. Recent ESA dose. Follow trend.  7.  Metabolic bone disease - Phos 7.6 . Ca 9.5 C Ca 10.2  Continue binders, decrease VDRA.  8.  Nutrition - Albumin 3.1. Renal carb mod diet, renal vit, nepro. 9.  DM-per primary.     H.  NP-C 12/28/2017, 11:17 AM  [employer] [phone]

## 2017-12-29 ENCOUNTER — Inpatient Hospital Stay (HOSPITAL_COMMUNITY): Payer: No Typology Code available for payment source

## 2017-12-29 ENCOUNTER — Encounter (HOSPITAL_COMMUNITY): Payer: Self-pay | Admitting: Cardiovascular Disease

## 2017-12-29 DIAGNOSIS — M79609 Pain in unspecified limb: Secondary | ICD-10-CM

## 2017-12-29 LAB — BASIC METABOLIC PANEL
Anion gap: 15 (ref 5–15)
BUN: 24 mg/dL — ABNORMAL HIGH (ref 8–23)
CALCIUM: 9.1 mg/dL (ref 8.9–10.3)
CO2: 25 mmol/L (ref 22–32)
CREATININE: 4.74 mg/dL — AB (ref 0.44–1.00)
Chloride: 96 mmol/L — ABNORMAL LOW (ref 98–111)
GFR calc Af Amer: 10 mL/min — ABNORMAL LOW (ref >60)
GFR, EST NON AFRICAN AMERICAN: 9 mL/min — AB (ref >60)
Glucose, Bld: 114 mg/dL — ABNORMAL HIGH (ref 70–99)
Potassium: 4 mmol/L (ref 3.5–5.1)
Sodium: 136 mmol/L (ref 135–145)

## 2017-12-29 LAB — GLUCOSE, CAPILLARY: Glucose-Capillary: 114 mg/dL — ABNORMAL HIGH (ref 70–99)

## 2017-12-29 MED ORDER — ASPIRIN 81 MG PO CHEW: 81.0000 mg | CHEWABLE_TABLET | Freq: Every day | ORAL | 3 refills | Status: DC

## 2017-12-29 NOTE — Progress Notes (Signed)
[facility] Progress Note   Subjective: Seen in room, up in room, wants to go home.   Objective Vitals:   12/28/17 1832 12/28/17 1900 12/28/17 2002 12/29/17 0327  BP: 131/66 (!) 106/43 (!) 120/49 (!) 156/51  Pulse: 77 66 67 (!) 57  Resp:   18 16  Temp:   98 F (36.7 C) 98.2 F (36.8 C)  TempSrc:   Oral Oral  SpO2: 100% 100% 96% 98%  Weight:    70 kg (154 lb 6.4 oz)  Height:       Physical Exam General: Elderly female in NAD Heart: S1,S2. RRR. SR on monitor.  Lungs: CTAB Abdomen: Active BS Extremities: No LE edema Dialysis Access: LUA AVF  No issues with AVF.    Additional Objective Labs: Basic Metabolic Panel: Recent Labs  Lab 12/27/17 0654 12/28/17 0826 12/29/17 0625  NA 135 132* 136  K 5.3* 5.2* 4.0  CL 90* 91* 96*  CO2 27 26 25   GLUCOSE 155* 208* 114*  BUN 40* 59* 24*  CREATININE 6.54* 7.89* 4.74*  CALCIUM 10.1 9.5 9.1  PHOS  --  7.6*  --    Liver Function Tests: Recent Labs  Lab 12/28/17 0826  ALBUMIN 3.1*   No results for input(s): LIPASE, AMYLASE in the last 168 hours. CBC: Recent Labs  Lab 12/26/17 1203 12/27/17 0654 12/27/17 2346  WBC 13.0* 7.5 7.1  HGB 11.3* 10.6* 9.7*  HCT 34.9* 33.6* 30.0*  MCV 107.1* 105.3* 104.5*  PLT 218 204 194   Blood Culture    Component Value Date/Time   SDES BLOOD RIGHT ANTECUBITAL 08/07/2016 2127   SPECREQUEST BOTTLES DRAWN AEROBIC AND ANAEROBIC 5 CC 08/07/2016 2127   CULT  08/07/2016 2127    NO GROWTH 5 DAYS Performed at [facility] [address], [place], [place] [address]    REPTSTATUS 08/13/2016 FINAL 08/07/2016 2127    Cardiac Enzymes: Recent Labs  Lab 12/27/17 0654 12/27/17 1334 12/27/17 2022 12/28/17 0540 12/28/17 1432  TROPONINI 0.04* 0.03* 0.03* 0.03* 0.03*   CBG: Recent Labs  Lab 12/28/17 1639 12/28/17 1736 12/28/17 1823 12/28/17 2116 12/29/17 0745  GLUCAP 73 65* 108* 269* 114*   Iron Studies: No results for input(s): IRON, TIBC, TRANSFERRIN,  FERRITIN in the last 72 hours. @lablastinr3 @ Studies/Results: No results found. Medications: . sodium chloride     . ARIPiprazole  7.5 mg Oral Daily  . aspirin  81 mg Oral Daily  . atorvastatin  20 mg Oral QHS  . calcitRIOL  1 mcg Oral Q M,W,F-HD  . Chlorhexidine Gluconate Cloth  6 each Topical Q0600  . Chlorhexidine Gluconate Cloth  6 each Topical Q0600  . docusate sodium  100 mg Oral Daily  . feeding supplement (NEPRO CARB STEADY)  237 mL Oral BID BM  . gabapentin  200 mg Oral QHS   And  . gabapentin  100 mg Oral Daily  . hydrALAZINE  25 mg Oral Daily  . insulin aspart  0-5 Units Subcutaneous QHS  . insulin aspart  0-9 Units Subcutaneous TID WC  . insulin detemir  6 Units Subcutaneous Daily  . isosorbide mononitrate  90 mg Oral Daily  . metoprolol tartrate  50 mg Oral BID  . multivitamin  1 tablet Oral QHS  . pantoprazole  20 mg Oral Daily  . ramelteon  8 mg Oral QHS  . sevelamer carbonate  1,600 mg Oral TID WC  . sodium chloride flush  3 mL Intravenous Q12H  Dialysis Orders: [place],S 3h 32min   68kg   2/2 bath  Hep 2000  LUA AVF -Mircera 75 mcg IV q 2 weeks (last dose 12/24/17 last HGB 10.9 12/24/17) -Venofer 50 mg IV weekly (last dose 12/24/2017 Last Fe 128 Tsat 46% 12/17/17) -Calcitriol 1.25 mcg PO TIW (last PTH 241 12/17/17)   Assessment/Plan: 1. Unstable Angina- sp L heart cath yesterday, no new findings. For dc today.  2. Pain L arm - this is access arm, we will arrange for patient to get an OP appt w/ [facility] for this.  No obvious steal on exam. Symptoms x 72mo.  3. ESRD - TTS. Had HD yest off schedule. Doesn't need HD today, will resume HD w/ next session on Thursday at [facility] 4. Hypertension/volume  - BP well controlled. On hydralazine, Imdur, metoprolol. UFG 1.0 liter.  5.  Anemia  - HGB 11.3 on adm, 10.6 today. Recent ESA dose. Follow trend.  6.  Metabolic bone disease - Phos 7.6 . Ca 9.5 C Ca 10.2  Continue binders, decrease VDRA.  7.  Nutrition -  Albumin 3.1. Renal carb mod diet, renal vit, nepro. 8.  DM-per primary 9.  Dispo - ok for dc home    [place] pager [phone]   12/29/2017, 2:16 PM

## 2017-12-29 NOTE — Care Management Note (Signed)
Case Management Note  Patient Details  Name: Robin [name] MRN: [id number] Date of Birth: 09/20/1950  Subjective/Objective:  Unstable Angina                 Action/Plan: Patient lives at home alone; her daughter takes her to appointments; has private insurance with Medicare; pharmacy of choice is [facility]; DME - walker, cane and wheelchair at home; [facility] choices offered, pt chose [facility]; Dan with [facility] called for arrangements. She has a personal caregiver through [employer]; CM will continue to follow for progression of care.  Expected Discharge Date: possibly 12/31/2017           Expected Discharge Plan:  [place]  Discharge planning Services  CM Consult Choice offered to:  Patient  HH Arranged:  RN [facility] Agency:  [facility]  Status of Service:  In process, will continue to follow  Sherrilyn Rist [phone] 12/29/2017, 11:05 AM

## 2017-12-29 NOTE — Progress Notes (Addendum)
Progress Note  [name] Date of Encounter: 12/29/2017  Primary Cardiologist: Sinclair Grooms, MD (previous patient in 2004) otherwise Dr. Marlou Porch   Subjective   Mild chest discomfort which is reproducible with palpation. No dyspnea.   Inpatient Medications    Scheduled Meds: . ARIPiprazole  7.5 mg Oral Daily  . aspirin  81 mg Oral Daily  . atorvastatin  20 mg Oral QHS  . calcitRIOL  1 mcg Oral Q M,W,F-HD  . Chlorhexidine Gluconate Cloth  6 each Topical Q0600  . Chlorhexidine Gluconate Cloth  6 each Topical Q0600  . docusate sodium  100 mg Oral Daily  . feeding supplement (NEPRO CARB STEADY)  237 mL Oral BID BM  . gabapentin  200 mg Oral QHS   And  . gabapentin  100 mg Oral Daily  . hydrALAZINE  25 mg Oral Daily  . insulin aspart  0-5 Units Subcutaneous QHS  . insulin aspart  0-9 Units Subcutaneous TID WC  . insulin detemir  6 Units Subcutaneous Daily  . isosorbide mononitrate  90 mg Oral Daily  . metoprolol tartrate  50 mg Oral BID  . multivitamin  1 tablet Oral QHS  . pantoprazole  20 mg Oral Daily  . ramelteon  8 mg Oral QHS  . sevelamer carbonate  1,600 mg Oral TID WC  . sodium chloride flush  3 mL Intravenous Q12H   Continuous Infusions: . sodium chloride     PRN Meds: sodium chloride, acetaminophen **OR** acetaminophen, diclofenac sodium, polyethylene glycol, senna-docusate, sodium chloride flush   Vital Signs    Vitals:   12/28/17 1832 12/28/17 1900 12/28/17 2002 12/29/17 0327  BP: 131/66 (!) 106/43 (!) 120/49 (!) 156/51  Pulse: 77 66 67 (!) 57  Resp:   18 16  Temp:   98 F (36.7 C) 98.2 F (36.8 C)  TempSrc:   Oral Oral  SpO2: 100% 100% 96% 98%  Weight:    154 lb 6.4 oz (70 kg)  Height:        Intake/Output Summary (Last 24 hours) at 12/29/2017 0924 Last data filed at 12/29/2017 0330 Gross per 24 hour  Intake 240 ml  Output 1100 ml  Net -860 ml   Filed Weights   12/28/17 0748 12/28/17 1140 12/29/17 0327  Weight: 154 lb  12.2 oz (70.2 kg) 153 lb 7 oz (69.6 kg) 154 lb 6.4 oz (70 kg)    Telemetry    SR. NSVT x 10 beats- Personally Reviewed  ECG    N/A  Physical Exam   GEN: No acute distress.   Neck: No JVD Cardiac: RRR, no murmurs, rubs, or gallops. R groin site without hematoma Respiratory: Clear to auscultation bilaterally.  GI: Soft, nontender, non-distended  MS: No edema; No deformity. Neuro:  Nonfocal  Psych: Normal affect   Labs    Chemistry Recent Labs  Lab 12/27/17 0654 12/28/17 0826 12/29/17 0625  NA 135 132* 136  K 5.3* 5.2* 4.0  CL 90* 91* 96*  CO2 27 26 25   GLUCOSE 155* 208* 114*  BUN 40* 59* 24*  CREATININE 6.54* 7.89* 4.74*  CALCIUM 10.1 9.5 9.1  ALBUMIN  --  3.1*  --   GFRNONAA 6* 5* 9*  GFRAA 7* 6* 10*  ANIONGAP 18* 15 15     Hematology Recent Labs  Lab 12/26/17 1203 12/27/17 0654 12/27/17 2346  WBC 13.0* 7.5 7.1  RBC 3.26* 3.19* 2.87*  HGB 11.3* 10.6* 9.7*  HCT 34.9* 33.6*  30.0*  MCV 107.1* 105.3* 104.5*  MCH 34.7* 33.2 33.8  MCHC 32.4 31.5 32.3  RDW 15.1 15.0 14.9  PLT 218 204 194    Cardiac Enzymes Recent Labs  Lab 12/27/17 1334 12/27/17 2022 12/28/17 0540 12/28/17 1432  TROPONINI 0.03* 0.03* 0.03* 0.03*    Recent Labs  Lab 12/26/17 1225  TROPIPOC 0.01      Radiology    No results found.  Cardiac Studies   LEFT HEART CATH AND CORONARY ANGIOGRAPHY  Conclusion     Prox RCA lesion is 20% stenosed.  Ost Ramus to Ramus lesion is 40% stenosed.  Ost Cx to Prox Cx lesion is 65% stenosed.  Prox Cx to Mid Cx lesion is 20% stenosed.  Ost LAD to Mid LAD lesion is 25% stenosed.  Ost 1st Diag lesion is 50% stenosed.  Ost 2nd Diag lesion is 70% stenosed.  Mid RCA to Dist RCA lesion is 30% stenosed.  The left ventricular systolic function is normal.  LV end diastolic pressure is normal.  The left ventricular ejection fraction is 45-50% by visual estimate.  There is no mitral valve regurgitation.   1. Mild non-obstructive  disease in the proximal and mid LAD 2. Moderate, heavily calcified proximal Circumflex stenosis. This appears to be an eccentric lesion and is not felt to be flow limiting. The moderate caliber intermediate branch arises from this lesion. The intermediate branch has a proximal stent that is patent with mild to moderate stent restenosis 3. The RCA is a moderate caliber co-dominant vessel with mild disease in the mid and distal segments.  4. Inferior wall hypokinesis with mild LV systolic dysfunction.   Recommendations: She has no obstructive disease in the LAD or RCA. Her Circumflex is heavily calcified proximally with moderate eccentric stenosis involving the moderate caliber intermediate branch. It is unclear how far back the intermediate stent extends. I would recommend medical management for now regarding the ostial/proximal stenosis. This lesion would be difficult to approach with PCI given the heavy calcification and the involvement of the intermediate branch which has a stent that extends back to the Circumflex. If she has recurrent angina, would consider addition of Ranexa.     Diagnostic Diagram        Patient Profile     67 y.o. female with hx of CAD, ESRD on HD, HTN, DM, DVT, OSA. CKD stage IV and bipolar disorder admitted with unstable angina.    Assessment & Plan    1. Unstable angina with hx of CAD  - Troponin 0.03 x 3. EKG shows ST segment depression fairly diffusely with T wave inversion. Cath as above. Mild non obstructive LAD and RCA. Heavy calcified, eccentric, non flow limiting proximal Cx with mild to moderate restenosis of intermediate branch. Recommended medical therapy. Inferior wall hypokinesis with mild LV systolic dysfunction. ? Echo per MD. Mild chest discomfort is MSK in etiology which is reproducible with palpation.  - Continue ASA, statin, Imdur, BB and statin.   2. ESRD on HD - seen during dialysis  3. HLD - No results found for requested labs within  last 8760 hours.  - Consider Lipid penal check as outpatient. LDL goal less than 70.  - Consider up-titration of Lipitor   [facility] will sign off.   Medication Recommendations:  Continue current medications  Other recommendations (labs, testing, etc):  Wait Dr. Francesca Oman recommendations , check lipid panel  Follow up as an outpatient:  With APP 9/4 @ 9:30am  For questions or updates,  please contact [facility] Please consult [email or web address] for contact info under Cardiology/STEMI.   SignedCrista Luria [place], PA  12/29/2017, 9:24 AM     The patient was seen, examined and discussed with Bhagat,Bhavinkumar PA-C and I agree with the above.   Cath showed mild non obstructive LAD and RCA. Heavy calcified, eccentric, non flow limiting proximal Cx with mild to moderate restenosis of intermediate branch. Recommended medical therapy. Telemetry shows 1 episode of ns VT - 10 beats, asymptomatic, no room to uptitrate BB as she is bradycardic at baseline. We will sign off and arrange for an outpatient follow up.  Ena Dawley, MD 12/29/2017

## 2017-12-29 NOTE — Progress Notes (Signed)
Vascular Ultrasound Duplex Dialysis Access (AVF, AGV) has been completed.  12/29/2017 11:59 AM Maudry Mayhew, BS, RVT, RDCS, RDMS

## 2017-12-29 NOTE — Progress Notes (Addendum)
Subjective: Patient was doing well today, no acute events over night. She denies any chest pain today, just reports that she feels a fullness in her chest. She reports that she tolerated the procedure well yesterday and we discussed the results of the catheterization with her. She had already seen cardiology when we visited and was aware of their plan. She reports that she is ready to go home.   Objective:  Vital signs in last 24 hours: Vitals:   12/28/17 1832 12/28/17 1900 12/28/17 2002 12/29/17 0327  BP: 131/66 (!) 106/43 (!) 120/49 (!) 156/51  Pulse: 77 66 67 (!) 57  Resp:   18 16  Temp:   98 F (36.7 C) 98.2 F (36.8 C)  TempSrc:   Oral Oral  SpO2: 100% 100% 96% 98%  Weight:    154 lb 6.4 oz (70 kg)  Height:       General: Well nourished, well appearing, NAD HEENT: Normocephalic, atraumatic, midline trachea Cardiac: RRR, no murmurs, rubs or gallops Pulmonary: Lungs CTA bilaterally, no wheezing rhonchi or rales Abdomen: Soft, non-tender, +BS, no masses Extremity: No LE edema, dialysis graft in left arm with thrills Neuro: Alert and oriented, moves all extremities Psychiatry: Normal mood and affect   Assessment/Plan:  Active Problems:   Diabetes mellitus type 2, insulin dependent (HCC)   Benign essential hypertension with delivery   Unstable angina (HCC)   Dyslipidemia   End stage renal disease on dialysis [name])  NSTEMI: Presented with atypical chest pain that often worsened with dialysis. She has been having multiple symptoms including diaphoresis, chest pain, SOB, and left arm pain that can occur with or without the chest pain but often become worse with dialysis so she hasn't been completing most of her treatments. Troponins were elevated. EKGs have had no ST changes. She was planned for a cath today with cardiology. She also had been having right arm pain with the chest pain, she does had a dialysis graft in place and has had clots in it previously, so there is a  concern for restenosis.  -Catheterization on 8/5: Mild non-obstructive disease in proximal and mid LAD. Moderate, heavily calcified proximal circumflex stenosis, not felt to be flow limiting, intermediate branch comes off of here and has a proximal stent with moderate stent restenosis, RCA is moderate caliber co-dominant vessel with mild disease, and inferior wall hypokinesis with mild LV systolic dysfunction. They recommend medical management for now since PCI would be difficult to do. -Right upper extremity ultrasound -Heparin drip  -ASA 81 mg -Continue ASA, atorvastatin, imdur, metoprolol on discharge  Acute on chronic kidney disease: Patient got dialysis yesterday with no issues. Her BMP today showed K 4.0, BUN 24, and Cr 4.74.  -Continue regular dialysis schedule on discharge  Heart failure:EF in 2011 showed 35-40%, last echo in 3/18 showed EF 50-55% and grade 2 diastolic dysfunction.  -On hydralazine, lipitor,metoprolol, and imdur at home -Continue home medications  ZOX:WRUEAVW was normotensive on admission. She is on metoprolol, and hydralazine at home. -Continue home medications  Diabetes mellitus type 2:Patients glucose on admission was 331. She is on humalog TID with meals, and levemir 25 in AM and 40 in PM at home.  -SSI-sensitive -Levemir 14 units -CBG q4 hour  Bipolar disorder: Is on aripiprazole at home. Continue home medications. GERD:Patient is on omeprazole at home. Continuepantoprazole.  FEN:No fluids, replete lytes prn,Renaldiet  VTE UJW:[id number] Code Status: FULL   Dispo: Anticipated discharge in approximately today.   Asencion Noble, MD  12/29/2017, 6:09 AM Pager: [phone]

## 2017-12-29 NOTE — Progress Notes (Signed)
Discharge instructions (including medications) discussed with and copy provided to patient/caregiver 

## 2017-12-29 NOTE — Discharge Instructions (Signed)
Robin [name],  It has been a pleasure working with you and we are glad you're feeling better. You were hospitalized for chest pain and abdominal pain.   For your chest pain,  START taking aspirin 81mg  daily along with your previously prescribed medications.  Follow up with Cardiology, appointment scheduled on 01/27/18 at 9:30am for hospital follow up.   For your Abdominal pain,  Please keep a log book of when the symptoms occur, what you were doing when it started, and if anything improved it Discuss these findings with your PCP  Please follow up with nephrology and continue to go to your dialysis appointments  Please follow up with your primary care provider at the [facility] in 1-2 weeks   If your symptoms worsen or you develop new symptoms, please seek medical help whether it is your primary care provider or emergency department.  If you have any questions about this hospitalization please call [phone].

## 2017-12-29 NOTE — Discharge Summary (Addendum)
Name: [name] MRN: [id number] DOB: [birth year] 67 y.o. PCP: [facility] [place]  Date of Admission: 12/26/2017 11:47 AM Date of Discharge: 12/29/2017  Attending Physician: Annia Belt, MD  Discharge Diagnosis: 1. NSTEMI 2. ESRD  Discharge Medications: Allergies as of 12/29/2017   No Known Allergies     Medication List    TAKE these medications   ammonium lactate 12 % lotion Commonly known as:  LAC-HYDRIN Apply 1 application topically 2 (two) times daily as needed (for itching/irritation).   ARIPiprazole 15 MG tablet Commonly known as:  ABILIFY Take 7.5 mg by mouth daily.   aspirin 81 MG chewable tablet Chew 1 tablet (81 mg total) by mouth daily. Start taking on:  12/30/2017   atorvastatin 40 MG tablet Commonly known as:  LIPITOR Take 20 mg by mouth at bedtime.   dextrose 40 % Gel Commonly known as:  GLUTOSE Take 1 Tube by mouth once as needed (for blood sugar less than 60).   docusate sodium 100 MG capsule Commonly known as:  COLACE Take 100 mg by mouth daily.   gabapentin 100 MG capsule Commonly known as:  NEURONTIN Take 100-200 mg by mouth See admin instructions. Take 100mg   (one capsule) in the morning and 200mg  (2 capsules) at night   hydrALAZINE 50 MG tablet Commonly known as:  APRESOLINE Take 25 mg by mouth daily.   insulin detemir 100 UNIT/ML injection Commonly known as:  LEVEMIR Inject 0.08 mLs (8 Units total) into the skin at bedtime. What changed:    how much to take  when to take this  additional instructions   insulin lispro 100 UNIT/ML injection Commonly known as:  HUMALOG Inject 3-15 Units into the skin 3 (three) times daily before meals. Pt uses as needed per sliding scale:    150-200:  3  units 201-250:  5 units 251-300:  8 units 301-350:  10 units 351-400:  12 units and call MD 400+ 15 units   isosorbide mononitrate 60 MG 24 hr tablet Commonly known as:  IMDUR Take 60 mg by mouth daily.   lidocaine 4 %  cream Commonly known as:  LMX Apply 1 application topically 3 (three) times daily.   Melatonin 5 MG Tabs Take 10 mg by mouth at bedtime as needed (for sleep).   metoprolol tartrate 50 MG tablet Commonly known as:  LOPRESSOR Take 50 mg by mouth 2 (two) times daily. Patient unsure of dose   multivitamin Tabs tablet Take 1 tablet by mouth daily.   nitroGLYCERIN 0.4 MG SL tablet Commonly known as:  NITROSTAT Place 0.4 mg under the tongue every 5 (five) minutes as needed for chest pain.   ondansetron 4 MG disintegrating tablet Commonly known as:  ZOFRAN ODT Take 1 tablet (4 mg total) by mouth every 8 (eight) hours as needed for nausea or vomiting.   pantoprazole 20 MG tablet Commonly known as:  PROTONIX Take 20 mg by mouth daily.   polyethylene glycol packet Commonly known as:  MIRALAX / GLYCOLAX Take 17 g by mouth daily as needed for mild constipation.   sennosides-docusate sodium 8.6-50 MG tablet Commonly known as:  SENOKOT-S Take 2 tablets by mouth 2 (two) times daily as needed for constipation.   sevelamer carbonate 800 MG tablet Commonly known as:  RENVELA Take 1,600 mg by mouth 3 (three) times daily with meals.       Disposition and follow-up:   Ms.Pleasant Robin [name] was discharged from [facility] in  Good condition.  At the hospital follow up visit please address:  1.  Her symptoms of chest pain and abdominal pain.  Patient has appointment with cardiology on 9/4 at 9:30.   2.  Labs / imaging needed at time of follow-up: None  3.  Pending labs/ test needing follow-up: None  Follow-up Appointments: Follow-up Information    Imogene Burn, PA-C. Go on 01/27/2018.   Specialty:  Cardiology Why:  @9 :30am for hospital follow up. Please arrive 15 minutes early. Call to reschedule  Contact information: [address] [place] [address] [place].   Why:  A home health care nurse will go to  your home Contact information: [address] [address] [place] Hospital Course by problem list: 1. NSTEMI: Patient presented with atypical chest pain that started while she was getting dialysis, not worsened with movement but relieved with nitroglycerin. She also has been having abdominal pain, SOB, left arm pain that can occur with or without the chest pain, all of these symptoms have started about 3-4 months prior to admission. She reports that most of those symptoms can be worse with her hemodialysis and that she normally does not complete the while dialysis duration. On exam there was tenderness to palpation in the middle of the chest. Her EKG showed inferolateral ST segment depression with T wave inversions that were new compared to the previous EKGs. Cardiology evaluated and planned for a catheterization on Monday 8/5. She was placed on IV heparin and continued on ASA until then. Troponin's were 0.03, 0.03, and 0.03. Her chest pain improved over hte next few days. The catheterization showed non-obstructive disease in the LAD, moderate calcified proximal circumflex stenosis that they did not think were flow limiting,inferior wall hypokinesis with mild LV systolic dysfunction. They recommended medical management for now for the ostial proximal stenosis, and if she gets recurrent angina consider adding ranexa. She was continued on atorvastatin, imdur, and metoprolol, and ASA was added at discharge. Has a follow up with cardiology.   2. ESRD: Patient is on Tues, Thurs, Sat dialysis. She was getting dialysis and did not complete her course. On admission her labs were as follows: K 4.6, BUN 24, Cr 4.2. Nephrology was consulted and planned dialysis on 8/5. Patient completed dialysis with no issues, denied any pain while getting it. Labs on discharge showed K 5.3, BUN 40, and Cr 6.54. She will resume her outpatient dialysis schedule on discharge.     Discharge Vitals:    BP (!) 156/51 (BP Location: Right Arm)   Pulse (!) 57   Temp 98.2 F (36.8 C) (Oral)   Resp 16   Ht 5\' 4"  (1.626 m)   Wt 154 lb 6.4 oz (70 kg)   SpO2 98%   BMI 26.50 kg/m   Pertinent Labs, Studies, and Procedures:   BMP Latest Ref Rng & Units 12/29/2017 12/28/2017 12/27/2017  Glucose 70 - 99 mg/dL 114(H) 208(H) 155(H)  BUN 8 - 23 mg/dL 24(H) 59(H) 40(H)  Creatinine 0.44 - 1.00 mg/dL 4.74(H) 7.89(H) 6.54(H)  Sodium 135 - 145 mmol/L 136 132(L) 135  Potassium 3.5 - 5.1 mmol/L 4.0 5.2(H) 5.3(H)  Chloride 98 - 111 mmol/L 96(L) 91(L) 90(L)  CO2 22 - 32 mmol/L 25 26 27   Calcium 8.9 - 10.3 mg/dL 9.1 9.5 10.1    LEFT HEART CATH AND  CORONARY ANGIOGRAPHY  Conclusion     Prox RCA lesion is 20% stenosed.  Ost Ramus to Ramus lesion is 40% stenosed.  Ost Cx to Prox Cx lesion is 65% stenosed.  Prox Cx to Mid Cx lesion is 20% stenosed.  Ost LAD to Mid LAD lesion is 25% stenosed.  Ost 1st Diag lesion is 50% stenosed.  Ost 2nd Diag lesion is 70% stenosed.  Mid RCA to Dist RCA lesion is 30% stenosed.  The left ventricular systolic function is normal.  LV end diastolic pressure is normal.  The left ventricular ejection fraction is 45-50% by visual estimate.  There is no mitral valve regurgitation.  1. Mild non-obstructive disease in the proximal and mid LAD 2. Moderate, heavily calcified proximal Circumflex stenosis. This appears to be an eccentric lesion and is not felt to be flow limiting. The moderate caliber intermediate branch arises from this lesion. The intermediate branch has a proximal stent that is patent with mild to moderate stent restenosis 3. The RCA is a moderate caliber co-dominant vessel with mild disease in the mid and distal segments.  4. Inferior wall hypokinesis with mild LV systolic dysfunction.   Recommendations: She has no obstructive disease in the LAD or RCA. Her Circumflex is heavily calcified proximally with moderate eccentric stenosis involving the  moderate caliber intermediate branch. It is unclear how far back the intermediate stent extends. I would recommend medical management for now regarding the ostial/proximal stenosis. This lesion would be difficult to approach with PCI given the heavy calcification and the involvement of the intermediate branch which has a stent that extends back to the Circumflex. If she has recurrent angina, would consider addition of Ranexa.    Discharge Instructions: Discharge Instructions    Diet - low sodium heart healthy   Complete by:  As directed    Increase activity slowly   Complete by:  As directed       Signed: Asencion Noble, MD 12/29/2017, 2:51 PM   Pager: [phone]

## 2018-01-01 DIAGNOSIS — E1122 Type 2 diabetes mellitus with diabetic chronic kidney disease: Secondary | ICD-10-CM | POA: Diagnosis not present

## 2018-01-01 DIAGNOSIS — E785 Hyperlipidemia, unspecified: Secondary | ICD-10-CM | POA: Diagnosis not present

## 2018-01-01 DIAGNOSIS — Z86718 Personal history of other venous thrombosis and embolism: Secondary | ICD-10-CM | POA: Diagnosis not present

## 2018-01-01 DIAGNOSIS — G4733 Obstructive sleep apnea (adult) (pediatric): Secondary | ICD-10-CM | POA: Diagnosis not present

## 2018-01-01 DIAGNOSIS — N186 End stage renal disease: Secondary | ICD-10-CM | POA: Diagnosis not present

## 2018-01-01 DIAGNOSIS — F319 Bipolar disorder, unspecified: Secondary | ICD-10-CM | POA: Diagnosis not present

## 2018-01-01 DIAGNOSIS — F419 Anxiety disorder, unspecified: Secondary | ICD-10-CM | POA: Diagnosis not present

## 2018-01-01 DIAGNOSIS — I214 Non-ST elevation (NSTEMI) myocardial infarction: Secondary | ICD-10-CM | POA: Diagnosis not present

## 2018-01-01 DIAGNOSIS — D631 Anemia in chronic kidney disease: Secondary | ICD-10-CM | POA: Diagnosis not present

## 2018-01-01 DIAGNOSIS — M1990 Unspecified osteoarthritis, unspecified site: Secondary | ICD-10-CM | POA: Diagnosis not present

## 2018-01-01 DIAGNOSIS — Z720 Tobacco use: Secondary | ICD-10-CM | POA: Diagnosis not present

## 2018-01-01 DIAGNOSIS — F329 Major depressive disorder, single episode, unspecified: Secondary | ICD-10-CM | POA: Diagnosis not present

## 2018-01-01 DIAGNOSIS — Z992 Dependence on renal dialysis: Secondary | ICD-10-CM | POA: Diagnosis not present

## 2018-01-01 DIAGNOSIS — Z794 Long term (current) use of insulin: Secondary | ICD-10-CM | POA: Diagnosis not present

## 2018-01-01 DIAGNOSIS — Z7982 Long term (current) use of aspirin: Secondary | ICD-10-CM | POA: Diagnosis not present

## 2018-01-01 DIAGNOSIS — I5030 Unspecified diastolic (congestive) heart failure: Secondary | ICD-10-CM | POA: Diagnosis not present

## 2018-01-01 DIAGNOSIS — K219 Gastro-esophageal reflux disease without esophagitis: Secondary | ICD-10-CM | POA: Diagnosis not present

## 2018-01-01 DIAGNOSIS — I132 Hypertensive heart and chronic kidney disease with heart failure and with stage 5 chronic kidney disease, or end stage renal disease: Secondary | ICD-10-CM | POA: Diagnosis not present

## 2018-01-01 DIAGNOSIS — I2510 Atherosclerotic heart disease of native coronary artery without angina pectoris: Secondary | ICD-10-CM | POA: Diagnosis not present

## 2018-01-04 ENCOUNTER — Other Ambulatory Visit (HOSPITAL_COMMUNITY): Payer: Self-pay | Admitting: Nephrology

## 2018-01-04 DIAGNOSIS — N186 End stage renal disease: Secondary | ICD-10-CM

## 2018-01-05 ENCOUNTER — Other Ambulatory Visit (HOSPITAL_COMMUNITY): Payer: Self-pay | Admitting: Nephrology

## 2018-01-05 ENCOUNTER — Ambulatory Visit (HOSPITAL_COMMUNITY)
Admission: RE | Admit: 2018-01-05 | Discharge: 2018-01-05 | Disposition: A | Discharge status: Home / Self Care | Payer: Non-veteran care | Source: Ambulatory Visit | Attending: Nephrology | Admitting: Nephrology

## 2018-01-05 ENCOUNTER — Other Ambulatory Visit: Payer: Self-pay | Admitting: Radiology

## 2018-01-05 ENCOUNTER — Encounter (HOSPITAL_COMMUNITY): Payer: Self-pay | Admitting: Interventional Radiology

## 2018-01-05 DIAGNOSIS — Y832 Surgical operation with anastomosis, bypass or graft as the cause of abnormal reaction of the patient, or of later complication, without mention of misadventure at the time of the procedure: Secondary | ICD-10-CM | POA: Diagnosis not present

## 2018-01-05 DIAGNOSIS — N186 End stage renal disease: Secondary | ICD-10-CM | POA: Insufficient documentation

## 2018-01-05 DIAGNOSIS — T82858A Stenosis of vascular prosthetic devices, implants and grafts, initial encounter: Secondary | ICD-10-CM | POA: Diagnosis not present

## 2018-01-05 HISTORY — PX: IR US GUIDE VASC ACCESS LEFT: IMG2389

## 2018-01-05 HISTORY — PX: IR AV DIALY SHUNT INTRO NEEDLE/INTRACATH INITIAL W/PTA/IMG LEFT: IMG6103

## 2018-01-05 MED ORDER — IOPAMIDOL (ISOVUE-300) INJECTION 61%
INTRAVENOUS | Status: AC
  Administered 2018-01-05: 50 mL
  Filled 2018-01-05: qty 100

## 2018-01-05 MED ORDER — LIDOCAINE HCL 1 % IJ SOLN
INTRAMUSCULAR | Status: AC | PRN
  Administered 2018-01-05: 5 mL

## 2018-01-05 MED ORDER — LIDOCAINE HCL 1 % IJ SOLN
INTRAMUSCULAR | Status: AC
  Filled 2018-01-05: qty 20

## 2018-01-05 NOTE — Procedures (Signed)
Pre Procedure Dx: ESRD Post Procedure Dx: Same  Technically successful fistulogram with angioplasty.    EBL: Minimal  No immediate complications.  Jay Kensley Lares, MD Pager #: [phone]  

## 2018-01-26 NOTE — Progress Notes (Signed)
Cardiology Office Note    Date:  01/27/2018   ID:  Kristle, Wesch [birth year], MRN [id number]  PCP:  [facility]  Cardiologist: Sinclair Grooms, MD  No chief complaint on file.   History of Present Illness:  Robin [name] is a 67 y.o. female with history of CAD with history of prior cutting balloon and ostial ramus stent placed, ESRD on HD, HTN, OSA, DM.   Admitted with chest pain, negative troponins but abnormal EKG with T wave inversion diffuse as well as ST depression and cath 12/28/17 showed mild non obstructive CAD LAD & RCA. Heavy calcified, eccentric, non flow limiting proximal Cx with mild to moderate restenosis of intermediate branch. Recommended medical therapy.  If recurrent chest pain would add Ranexa. Inferior wall hypokinesis with mild LV systolic dysfunction.   Patient comes in today for f/u accompanied by her daughter.. Complains of ache in chest radiating down left arm associated with shortness of breath relieved with 1 NTG. Occurs about once every 3 weeks. Less frequent than before.  Also undergoing physical therapy for chronic left arm pain secondary to dialysis.  She is very sedentary and hardly gets up to move around anymore.  Daughter says blood pressure usually runs high at home but low on dialysis days.     Past Medical History:  Diagnosis Date  . Abdominal pain 09/09/2016  . Acute delirium 11/18/2014  . Acute encephalopathy   . Acute on chronic combined systolic and diastolic heart failure ([place]) 01/21/2015  . Acute on chronic respiratory failure with hypoxia ([place]) 09/09/2016  . Acute renal failure superimposed on stage 4 chronic kidney disease ([facility]) 09/09/2016  . Acute respiratory failure with hypoxia ([place]) 11/29/2015  . Anemia in chronic kidney disease 12/09/2014  . Anxiety   . Arthritis   . Benign hypertension   . Bipolar affective disorder ([place]) 12/09/2014  . CAD (coronary artery disease), native coronary artery with 2 stents  11/17/2014  . CAP  (community acquired pneumonia) 03/13/2016  . Charcot ankle 03/16/2015  . Charcot foot due to diabetes mellitus ([place])   . Chronic combined systolic and diastolic CHF (congestive heart failure) ([place]) 11/18/2014  . Chronic kidney disease, stage V [facility])    Nephrologist is with [facility] (Dr. Tammi Klippel)  . CKD (chronic kidney disease) 12/09/2014  . Closed left ankle fracture 11/17/2014  . Confusion 01/21/2015  . Depression   . Diabetes mellitus without complication ([place])   . DVT (deep venous thrombosis) ([facility])    right lower leg  . Dyslipidemia   . Elevated troponin 09/09/2016  . End stage renal disease on dialysis ([place])   . Fracture dislocation of ankle 11/17/2014  . GERD (gastroesophageal reflux disease)   . Heart murmur   . History of blood transfusion   . History of bronchitis   . History of pneumonia   . Hyperlipidemia 03/26/2015  . Hypertensive heart/renal disease with failure ([place]) 12/09/2014  . Hypokalemia 11/17/2014  . Obesity 11/17/2014  . Onychomycosis 10/07/2016  . Right leg DVT ([place]) 12/05/2015  . Right leg swelling 11/29/2015  . Shortness of breath dyspnea   . SIRS (systemic inflammatory response syndrome) ([place]) 08/08/2016  . Sleep apnea   . Type 2 diabetes mellitus ([place])   . Unstable angina ([facility]) 12/26/2017  . Ventricular tachycardia [facility])     Past Surgical History:  Procedure Laterality Date  . ABDOMINAL HYSTERECTOMY    . ANKLE CLOSED REDUCTION N/A 11/17/2014   Procedure: CLOSED REDUCTION ANKLE;  Surgeon: Earlie Server, MD;  Location: [facility];  Service: Orthopedics;  Laterality: N/A;  . ANKLE FUSION Left 03/16/2015   Procedure: Left Tibiocalcaneal Fusion;  Surgeon: Newt Minion, MD;  Location: [facility];  Service: Orthopedics;  Laterality: Left;  . APPLICATION OF WOUND VAC Right 08/06/2016   Procedure: APPLICATION OF PREVENA WOUND VAC;  Surgeon: Newt Minion, MD;  Location: [facility];  Service: Orthopedics;  Laterality: Right;  . AV FISTULA PLACEMENT Left [id number]   done at [facility]     2 stent   . CHOLECYSTECTOMY    . CORONARY STENT PLACEMENT    . FOOT ARTHRODESIS Right 08/06/2016   Procedure: Right Foot Fusion Lisfranc Joint;  Surgeon: Newt Minion, MD;  Location: [facility];  Service: Orthopedics;  Laterality: Right;  . HARDWARE REMOVAL Left 03/16/2015   Procedure: Removal Hardware Left Ankle;  Surgeon: Newt Minion, MD;  Location: [facility];  Service: Orthopedics;  Laterality: Left;  . IR AV DIALY SHUNT INTRO NEEDLE/INTRACATH INITIAL W/PTA/IMG LEFT  01/05/2018  . IR US GUIDE VASC ACCESS LEFT  01/05/2018  . LEFT HEART CATH AND CORONARY ANGIOGRAPHY N/A 12/28/2017   Procedure: LEFT HEART CATH AND CORONARY ANGIOGRAPHY;  Surgeon: Burnell Blanks, MD;  Location: [facility];  Service: Cardiovascular;  Laterality: N/A;  . ORIF ANKLE FRACTURE Left 11/20/2014   Procedure: OPEN REDUCTION INTERNAL FIXATION (ORIF) ANKLE FRACTURE;  Surgeon: Renette Butters, MD;  Location: [facility];  Service: Orthopedics;  Laterality: Left;  . TONSILLECTOMY      Current Medications: Current Meds  Medication Sig  . ammonium lactate (LAC-HYDRIN) 12 % lotion Apply 1 application topically 2 (two) times daily as needed (for itching/irritation).   . ARIPiprazole (ABILIFY) 15 MG tablet Take 7.5 mg by mouth daily.   Marland Kitchen aspirin 81 MG chewable tablet Chew 1 tablet (81 mg total) by mouth daily.  Marland Kitchen atorvastatin (LIPITOR) 40 MG tablet Take 20 mg by mouth at bedtime.   Marland Kitchen dextrose (GLUTOSE) 40 % GEL Take 1 Tube by mouth once as needed (for blood sugar less than 60).   . docusate sodium (COLACE) 100 MG capsule Take 100 mg by mouth daily.   Marland Kitchen gabapentin (NEURONTIN) 100 MG capsule Take 100-200 mg by mouth See admin instructions. Take 100mg   (one capsule) in the morning and 200mg  (2 capsules) at night  . hydrALAZINE (APRESOLINE) 50 MG tablet Take 25 mg by mouth daily.   . insulin detemir (LEVEMIR) 100 UNIT/ML injection Inject 0.08 mLs (8 Units total) into the skin at bedtime. (Patient  taking differently: Inject 25-40 Units into the skin See admin instructions. 25 units in the AM and 40 units at bedtime)  . insulin lispro (HUMALOG) 100 UNIT/ML injection Inject 3-15 Units into the skin 3 (three) times daily before meals. Pt uses as needed per sliding scale:    150-200:  3  units 201-250:  5 units 251-300:  8 units 301-350:  10 units 351-400:  12 units and call MD 400+ 15 units  . isosorbide mononitrate (IMDUR) 60 MG 24 hr tablet Take 60 mg by mouth daily.   Marland Kitchen lidocaine (LMX) 4 % cream Apply 1 application topically 3 (three) times daily.  . Melatonin 5 MG TABS Take 10 mg by mouth at bedtime as needed (for sleep).   . metoprolol tartrate (LOPRESSOR) 50 MG tablet Take 50 mg by mouth 2 (two) times daily. Patient unsure of dose  . multivitamin (RENA-VIT) TABS tablet  Take 1 tablet by mouth daily.  . nitroGLYCERIN (NITROSTAT) 0.4 MG SL tablet Place 0.4 mg under the tongue every 5 (five) minutes as needed for chest pain.  . pantoprazole (PROTONIX) 20 MG tablet Take 20 mg by mouth daily.   . sennosides-docusate sodium (SENOKOT-S) 8.6-50 MG tablet Take 2 tablets by mouth 2 (two) times daily as needed for constipation.   . sevelamer carbonate (RENVELA) 800 MG tablet Take 1,600 mg by mouth 3 (three) times daily with meals.     Allergies:   Patient has no known allergies.   Social History   Socioeconomic History  . Marital status: Divorced    Spouse name: Not on file  . Number of children: Not on file  . Years of education: Not on file  . Highest education level: Not on file  Occupational History  . Not on file  Social Needs  . Financial resource strain: Not on file  . Food insecurity:    Worry: Not on file    Inability: Not on file  . Transportation needs:    Medical: Not on file    Non-medical: Not on file  Tobacco Use  . Smoking status: Current Every Day Smoker    Packs/day: 1.00    Types: Cigarettes  . Smokeless tobacco: Never Used  Substance and Sexual Activity    . Alcohol use: No  . Drug use: No  . Sexual activity: Never  Lifestyle  . Physical activity:    Days per week: Not on file    Minutes per session: Not on file  . Stress: Not on file  Relationships  . Social connections:    Talks on phone: Not on file    Gets together: Not on file    Attends religious service: Not on file    Active member of club or organization: Not on file    Attends meetings of clubs or organizations: Not on file    Relationship status: Not on file  Other Topics Concern  . Not on file  Social History Narrative  . Not on file     Family History:  The patient's Family history is unknown by patient.   ROS:   Please see the history of present illness.    Review of Systems  Constitution: Positive for malaise/fatigue.  HENT: Negative.   Eyes: Negative.   Cardiovascular: Positive for chest pain and leg swelling.  Respiratory: Negative.   Hematologic/Lymphatic: Bruises/bleeds easily.  Musculoskeletal: Positive for back pain and myalgias. Negative for joint pain.  Gastrointestinal: Negative.   Genitourinary: Negative.   Neurological: Positive for loss of balance.   All other systems reviewed and are negative.   PHYSICAL EXAM:   VS:  BP (!) 99/54   Pulse 76   Ht 5\' 4"  (1.626 m)   Wt 160 lb (72.6 kg)   BMI 27.46 kg/m   Physical Exam  GEN: Well nourished, well developed, in no acute distress  Neck: no JVD, carotid bruits, or masses Cardiac:RRR; S4, 1/6 systolic murmur at the left sternal border Respiratory:  clear to auscultation bilaterally, normal work of breathing GI: soft, nontender, nondistended, + BS Ext: Right groin at cath site without hematoma or hemorrhage, good femoral pulse.  Otherwise lower extremities without cyanosis, clubbing, or edema, Good distal pulses bilaterally Neuro:  Alert and Oriented x 3 Psych: euthymic mood, full affect  Wt Readings from Last 3 Encounters:  01/27/18 160 lb (72.6 kg)  12/29/17 154 lb 6.4 oz (70 kg)   07/06/17  144 lb (65.3 kg)      Studies/Labs Reviewed:   EKG:  EKG is  ordered today.    Recent Labs: 12/27/2017: Hemoglobin 9.7; Platelets 194 12/29/2017: BUN 24; Creatinine, Ser 4.74; Potassium 4.0; Sodium 136   Lipid Panel    Component Value Date/Time   CHOL 77 09/09/2016 0601   TRIG 102 09/09/2016 0601   HDL 32 (L) 09/09/2016 0601   CHOLHDL 2.4 09/09/2016 0601   VLDL 20 09/09/2016 0601   LDLCALC 25 09/09/2016 0601    Additional studies/ records that were reviewed today include:  2D echo 2018Study Conclusions   - Left ventricle: The cavity size was normal. Wall thickness was   increased in a pattern of moderate LVH. Systolic function was   normal. The estimated ejection fraction was in the range of 50%   to 55%. Wall motion was normal; there were no regional wall   motion abnormalities. Features are consistent with a pseudonormal   left ventricular filling pattern, with concomitant abnormal   relaxation and increased filling pressure (grade 2 diastolic   dysfunction). - Mitral valve: There was mild regurgitation. - Left atrium: The atrium was mildly dilated. - Right atrium: The atrium was mildly dilated.   Cardiac catheterization 12/26/2017 LEFT HEART CATH AND CORONARY ANGIOGRAPHY  Conclusion       Prox RCA lesion is 20% stenosed.  Ost Ramus to Ramus lesion is 40% stenosed.  Ost Cx to Prox Cx lesion is 65% stenosed.  Prox Cx to Mid Cx lesion is 20% stenosed.  Ost LAD to Mid LAD lesion is 25% stenosed.  Ost 1st Diag lesion is 50% stenosed.  Ost 2nd Diag lesion is 70% stenosed.  Mid RCA to Dist RCA lesion is 30% stenosed.  The left ventricular systolic function is normal.  LV end diastolic pressure is normal.  The left ventricular ejection fraction is 45-50% by visual estimate.  There is no mitral valve regurgitation.   1. Mild non-obstructive disease in the proximal and mid LAD 2. Moderate, heavily calcified proximal Circumflex stenosis. This appears  to be an eccentric lesion and is not felt to be flow limiting. The moderate caliber intermediate branch arises from this lesion. The intermediate branch has a proximal stent that is patent with mild to moderate stent restenosis 3. The RCA is a moderate caliber co-dominant vessel with mild disease in the mid and distal segments.  4. Inferior wall hypokinesis with mild LV systolic dysfunction.    Recommendations: She has no obstructive disease in the LAD or RCA. Her Circumflex is heavily calcified proximally with moderate eccentric stenosis involving the moderate caliber intermediate branch. It is unclear how far back the intermediate stent extends. I would recommend medical management for now regarding the ostial/proximal stenosis. This lesion would be difficult to approach with PCI given the heavy calcification and the involvement of the intermediate branch which has a stent that extends back to the Circumflex. If she has recurrent angina, would consider addition of Ranexa.          ASSESSMENT:    1. Coronary artery disease involving native coronary artery of native heart without angina pectoris   2. Essential hypertension   3. Chronic combined systolic and diastolic CHF (congestive heart failure) ([facility])   4. Stage 5 chronic kidney disease on chronic dialysis ([facility])      PLAN:  In order of problems listed above:  CAD status post remote stents and cutting balloon procedure, recurrent chest pain and repeat cath 12/26/2017 showed mild  non obstructive CAD LAD & RCA. Heavy calcified, eccentric, non flow limiting proximal Cx with mild to moderate restenosis of intermediate branch. Recommended medical therapy.  If recurrent chest pain would add Ranexa. Inferior wall hypokinesis with mild LV systolic dysfunction.  Patient has less angina but still occurring at rest.  We will add Ranexa 500 mg twice daily.  Follow-up with Dr. Tamala Julian in 2 months.  Essential hypertension blood pressure on the low side today  but daughter says it has been running high at home.  Chronic combined systolic and diastolic CHF compensated, mostly managed with dialysis  End-stage renal disease on hemodialysis   Medication Adjustments/Labs and Tests Ordered: Current medicines are reviewed at length with the patient today.  Concerns regarding medicines are outlined above.  Medication changes, Labs and Tests ordered today are listed in the Patient Instructions below. Patient Instructions  Medication Instructions:  Your physician has recommended you make the following change in your medication:   START: ranolazine (Ranexa) 500 mg tablet: Take 1 tablet twice a day  Labwork: None ordered  Testing/Procedures: None ordered  Follow-Up: Your physician recommends that you schedule a follow-up appointment in: 2 months with Dr. Tamala Julian   Any Other Special Instructions Will Be Listed Below (If Applicable).     If you need a refill on your cardiac medications before your next appointment, please call your pharmacy.     Sumner Boast, PA-C  01/27/2018 10:08 AM    [facility] [place], [place], [place]  [address] Phone: [phone]; Fax: [phone]

## 2018-01-27 ENCOUNTER — Ambulatory Visit (INDEPENDENT_AMBULATORY_CARE_PROVIDER_SITE_OTHER): Payer: Medicare Other | Admitting: Physician Assistant

## 2018-01-27 ENCOUNTER — Encounter: Payer: Self-pay | Admitting: Physician Assistant

## 2018-01-27 VITALS — BP 99/54 | HR 76 | Ht 64.0 in | Wt 160.0 lb

## 2018-01-27 DIAGNOSIS — I5042 Chronic combined systolic (congestive) and diastolic (congestive) heart failure: Secondary | ICD-10-CM | POA: Diagnosis not present

## 2018-01-27 DIAGNOSIS — I200 Unstable angina: Secondary | ICD-10-CM

## 2018-01-27 DIAGNOSIS — Z992 Dependence on renal dialysis: Secondary | ICD-10-CM

## 2018-01-27 DIAGNOSIS — N186 End stage renal disease: Secondary | ICD-10-CM | POA: Diagnosis not present

## 2018-01-27 DIAGNOSIS — I10 Essential (primary) hypertension: Secondary | ICD-10-CM | POA: Diagnosis not present

## 2018-01-27 DIAGNOSIS — I2510 Atherosclerotic heart disease of native coronary artery without angina pectoris: Secondary | ICD-10-CM | POA: Diagnosis not present

## 2018-01-27 MED ORDER — RANOLAZINE ER 500 MG PO TB12: 500.0000 mg | ORAL_TABLET | Freq: Two times a day (BID) | ORAL | 1 refills | Status: DC

## 2018-01-27 NOTE — Patient Instructions (Signed)
Medication Instructions:  Your physician has recommended you make the following change in your medication:   START: ranolazine (Ranexa) 500 mg tablet: Take 1 tablet twice a day  Labwork: None ordered  Testing/Procedures: None ordered  Follow-Up: Your physician recommends that you schedule a follow-up appointment in: 2 months with Dr. Tamala Julian   Any Other Special Instructions Will Be Listed Below (If Applicable).     If you need a refill on your cardiac medications before your next appointment, please call your pharmacy.

## 2018-02-02 DIAGNOSIS — N186 End stage renal disease: Secondary | ICD-10-CM | POA: Diagnosis not present

## 2018-02-02 DIAGNOSIS — I132 Hypertensive heart and chronic kidney disease with heart failure and with stage 5 chronic kidney disease, or end stage renal disease: Secondary | ICD-10-CM | POA: Diagnosis not present

## 2018-02-02 DIAGNOSIS — E1122 Type 2 diabetes mellitus with diabetic chronic kidney disease: Secondary | ICD-10-CM | POA: Diagnosis not present

## 2018-02-02 DIAGNOSIS — I2510 Atherosclerotic heart disease of native coronary artery without angina pectoris: Secondary | ICD-10-CM | POA: Diagnosis not present

## 2018-02-02 DIAGNOSIS — I214 Non-ST elevation (NSTEMI) myocardial infarction: Secondary | ICD-10-CM | POA: Diagnosis not present

## 2018-02-02 DIAGNOSIS — I5030 Unspecified diastolic (congestive) heart failure: Secondary | ICD-10-CM | POA: Diagnosis not present

## 2018-02-03 ENCOUNTER — Other Ambulatory Visit (HOSPITAL_COMMUNITY): Payer: Self-pay | Admitting: Nephrology

## 2018-02-03 DIAGNOSIS — N186 End stage renal disease: Secondary | ICD-10-CM

## 2018-02-04 ENCOUNTER — Other Ambulatory Visit: Payer: Self-pay | Admitting: Radiology

## 2018-02-05 DIAGNOSIS — I214 Non-ST elevation (NSTEMI) myocardial infarction: Secondary | ICD-10-CM | POA: Diagnosis not present

## 2018-02-05 DIAGNOSIS — I5030 Unspecified diastolic (congestive) heart failure: Secondary | ICD-10-CM | POA: Diagnosis not present

## 2018-02-05 DIAGNOSIS — N186 End stage renal disease: Secondary | ICD-10-CM | POA: Diagnosis not present

## 2018-02-05 DIAGNOSIS — I2510 Atherosclerotic heart disease of native coronary artery without angina pectoris: Secondary | ICD-10-CM | POA: Diagnosis not present

## 2018-02-05 DIAGNOSIS — E1122 Type 2 diabetes mellitus with diabetic chronic kidney disease: Secondary | ICD-10-CM | POA: Diagnosis not present

## 2018-02-05 DIAGNOSIS — I132 Hypertensive heart and chronic kidney disease with heart failure and with stage 5 chronic kidney disease, or end stage renal disease: Secondary | ICD-10-CM | POA: Diagnosis not present

## 2018-02-08 ENCOUNTER — Ambulatory Visit (HOSPITAL_COMMUNITY)
Admission: RE | Admit: 2018-02-08 | Discharge: 2018-02-08 | Disposition: A | Discharge status: Home / Self Care | Payer: Non-veteran care | Source: Ambulatory Visit | Attending: Nephrology | Admitting: Nephrology

## 2018-02-08 ENCOUNTER — Encounter (HOSPITAL_COMMUNITY): Payer: Self-pay

## 2018-02-10 DIAGNOSIS — E1122 Type 2 diabetes mellitus with diabetic chronic kidney disease: Secondary | ICD-10-CM | POA: Diagnosis not present

## 2018-02-10 DIAGNOSIS — I132 Hypertensive heart and chronic kidney disease with heart failure and with stage 5 chronic kidney disease, or end stage renal disease: Secondary | ICD-10-CM | POA: Diagnosis not present

## 2018-02-10 DIAGNOSIS — I5030 Unspecified diastolic (congestive) heart failure: Secondary | ICD-10-CM | POA: Diagnosis not present

## 2018-02-10 DIAGNOSIS — I2510 Atherosclerotic heart disease of native coronary artery without angina pectoris: Secondary | ICD-10-CM | POA: Diagnosis not present

## 2018-02-10 DIAGNOSIS — N186 End stage renal disease: Secondary | ICD-10-CM | POA: Diagnosis not present

## 2018-02-10 DIAGNOSIS — I214 Non-ST elevation (NSTEMI) myocardial infarction: Secondary | ICD-10-CM | POA: Diagnosis not present

## 2018-02-12 DIAGNOSIS — I5030 Unspecified diastolic (congestive) heart failure: Secondary | ICD-10-CM | POA: Diagnosis not present

## 2018-02-12 DIAGNOSIS — I132 Hypertensive heart and chronic kidney disease with heart failure and with stage 5 chronic kidney disease, or end stage renal disease: Secondary | ICD-10-CM | POA: Diagnosis not present

## 2018-02-12 DIAGNOSIS — I2510 Atherosclerotic heart disease of native coronary artery without angina pectoris: Secondary | ICD-10-CM | POA: Diagnosis not present

## 2018-02-12 DIAGNOSIS — I214 Non-ST elevation (NSTEMI) myocardial infarction: Secondary | ICD-10-CM | POA: Diagnosis not present

## 2018-02-12 DIAGNOSIS — N186 End stage renal disease: Secondary | ICD-10-CM | POA: Diagnosis not present

## 2018-02-12 DIAGNOSIS — E1122 Type 2 diabetes mellitus with diabetic chronic kidney disease: Secondary | ICD-10-CM | POA: Diagnosis not present

## 2018-02-12 DIAGNOSIS — D631 Anemia in chronic kidney disease: Secondary | ICD-10-CM | POA: Diagnosis not present

## 2018-02-23 ENCOUNTER — Other Ambulatory Visit: Payer: Self-pay | Admitting: Physician Assistant

## 2018-02-25 DIAGNOSIS — I214 Non-ST elevation (NSTEMI) myocardial infarction: Secondary | ICD-10-CM | POA: Diagnosis not present

## 2018-02-25 DIAGNOSIS — E1122 Type 2 diabetes mellitus with diabetic chronic kidney disease: Secondary | ICD-10-CM | POA: Diagnosis not present

## 2018-02-25 DIAGNOSIS — I132 Hypertensive heart and chronic kidney disease with heart failure and with stage 5 chronic kidney disease, or end stage renal disease: Secondary | ICD-10-CM | POA: Diagnosis not present

## 2018-02-25 DIAGNOSIS — I5030 Unspecified diastolic (congestive) heart failure: Secondary | ICD-10-CM | POA: Diagnosis not present

## 2018-02-25 DIAGNOSIS — N186 End stage renal disease: Secondary | ICD-10-CM | POA: Diagnosis not present

## 2018-02-25 DIAGNOSIS — I2510 Atherosclerotic heart disease of native coronary artery without angina pectoris: Secondary | ICD-10-CM | POA: Diagnosis not present

## 2018-03-02 DIAGNOSIS — I252 Old myocardial infarction: Secondary | ICD-10-CM | POA: Diagnosis not present

## 2018-03-02 DIAGNOSIS — Z794 Long term (current) use of insulin: Secondary | ICD-10-CM | POA: Diagnosis not present

## 2018-03-02 DIAGNOSIS — I5030 Unspecified diastolic (congestive) heart failure: Secondary | ICD-10-CM | POA: Diagnosis not present

## 2018-03-02 DIAGNOSIS — Z7982 Long term (current) use of aspirin: Secondary | ICD-10-CM | POA: Diagnosis not present

## 2018-03-02 DIAGNOSIS — M1990 Unspecified osteoarthritis, unspecified site: Secondary | ICD-10-CM | POA: Diagnosis not present

## 2018-03-02 DIAGNOSIS — Z992 Dependence on renal dialysis: Secondary | ICD-10-CM | POA: Diagnosis not present

## 2018-03-02 DIAGNOSIS — E785 Hyperlipidemia, unspecified: Secondary | ICD-10-CM | POA: Diagnosis not present

## 2018-03-02 DIAGNOSIS — E1122 Type 2 diabetes mellitus with diabetic chronic kidney disease: Secondary | ICD-10-CM | POA: Diagnosis not present

## 2018-03-02 DIAGNOSIS — N186 End stage renal disease: Secondary | ICD-10-CM | POA: Diagnosis not present

## 2018-03-02 DIAGNOSIS — I132 Hypertensive heart and chronic kidney disease with heart failure and with stage 5 chronic kidney disease, or end stage renal disease: Secondary | ICD-10-CM | POA: Diagnosis not present

## 2018-03-02 DIAGNOSIS — F329 Major depressive disorder, single episode, unspecified: Secondary | ICD-10-CM | POA: Diagnosis not present

## 2018-03-02 DIAGNOSIS — D631 Anemia in chronic kidney disease: Secondary | ICD-10-CM | POA: Diagnosis not present

## 2018-03-02 DIAGNOSIS — Z720 Tobacco use: Secondary | ICD-10-CM | POA: Diagnosis not present

## 2018-03-02 DIAGNOSIS — Z86718 Personal history of other venous thrombosis and embolism: Secondary | ICD-10-CM | POA: Diagnosis not present

## 2018-03-02 DIAGNOSIS — G4733 Obstructive sleep apnea (adult) (pediatric): Secondary | ICD-10-CM | POA: Diagnosis not present

## 2018-03-02 DIAGNOSIS — K219 Gastro-esophageal reflux disease without esophagitis: Secondary | ICD-10-CM | POA: Diagnosis not present

## 2018-03-02 DIAGNOSIS — I2510 Atherosclerotic heart disease of native coronary artery without angina pectoris: Secondary | ICD-10-CM | POA: Diagnosis not present

## 2018-03-02 DIAGNOSIS — F319 Bipolar disorder, unspecified: Secondary | ICD-10-CM | POA: Diagnosis not present

## 2018-03-02 DIAGNOSIS — F419 Anxiety disorder, unspecified: Secondary | ICD-10-CM | POA: Diagnosis not present

## 2018-03-25 DIAGNOSIS — E1122 Type 2 diabetes mellitus with diabetic chronic kidney disease: Secondary | ICD-10-CM | POA: Diagnosis not present

## 2018-03-25 DIAGNOSIS — N186 End stage renal disease: Secondary | ICD-10-CM | POA: Diagnosis not present

## 2018-03-25 DIAGNOSIS — I132 Hypertensive heart and chronic kidney disease with heart failure and with stage 5 chronic kidney disease, or end stage renal disease: Secondary | ICD-10-CM | POA: Diagnosis not present

## 2018-03-25 DIAGNOSIS — I2510 Atherosclerotic heart disease of native coronary artery without angina pectoris: Secondary | ICD-10-CM | POA: Diagnosis not present

## 2018-03-25 DIAGNOSIS — I252 Old myocardial infarction: Secondary | ICD-10-CM | POA: Diagnosis not present

## 2018-03-25 DIAGNOSIS — I5030 Unspecified diastolic (congestive) heart failure: Secondary | ICD-10-CM | POA: Diagnosis not present

## 2018-04-02 DIAGNOSIS — I252 Old myocardial infarction: Secondary | ICD-10-CM | POA: Diagnosis not present

## 2018-04-02 DIAGNOSIS — I132 Hypertensive heart and chronic kidney disease with heart failure and with stage 5 chronic kidney disease, or end stage renal disease: Secondary | ICD-10-CM | POA: Diagnosis not present

## 2018-04-02 DIAGNOSIS — E1122 Type 2 diabetes mellitus with diabetic chronic kidney disease: Secondary | ICD-10-CM | POA: Diagnosis not present

## 2018-04-02 DIAGNOSIS — I5030 Unspecified diastolic (congestive) heart failure: Secondary | ICD-10-CM | POA: Diagnosis not present

## 2018-04-02 DIAGNOSIS — N186 End stage renal disease: Secondary | ICD-10-CM | POA: Diagnosis not present

## 2018-04-02 DIAGNOSIS — I2510 Atherosclerotic heart disease of native coronary artery without angina pectoris: Secondary | ICD-10-CM | POA: Diagnosis not present

## 2018-04-06 NOTE — Progress Notes (Deleted)
Did not show. Should not be rescheduled with me.

## 2018-04-07 ENCOUNTER — Ambulatory Visit: Payer: Medicare Other | Admitting: Interventional Cardiology

## 2018-04-13 ENCOUNTER — Encounter: Payer: Self-pay | Admitting: Interventional Cardiology

## 2018-04-19 DIAGNOSIS — I132 Hypertensive heart and chronic kidney disease with heart failure and with stage 5 chronic kidney disease, or end stage renal disease: Secondary | ICD-10-CM | POA: Diagnosis not present

## 2018-04-19 DIAGNOSIS — I252 Old myocardial infarction: Secondary | ICD-10-CM | POA: Diagnosis not present

## 2018-04-19 DIAGNOSIS — I5030 Unspecified diastolic (congestive) heart failure: Secondary | ICD-10-CM | POA: Diagnosis not present

## 2018-04-19 DIAGNOSIS — I2510 Atherosclerotic heart disease of native coronary artery without angina pectoris: Secondary | ICD-10-CM | POA: Diagnosis not present

## 2018-04-19 DIAGNOSIS — E1122 Type 2 diabetes mellitus with diabetic chronic kidney disease: Secondary | ICD-10-CM | POA: Diagnosis not present

## 2018-04-19 DIAGNOSIS — N186 End stage renal disease: Secondary | ICD-10-CM | POA: Diagnosis not present

## 2018-04-26 DIAGNOSIS — I5030 Unspecified diastolic (congestive) heart failure: Secondary | ICD-10-CM | POA: Diagnosis not present

## 2018-04-26 DIAGNOSIS — I132 Hypertensive heart and chronic kidney disease with heart failure and with stage 5 chronic kidney disease, or end stage renal disease: Secondary | ICD-10-CM | POA: Diagnosis not present

## 2018-04-26 DIAGNOSIS — N186 End stage renal disease: Secondary | ICD-10-CM | POA: Diagnosis not present

## 2018-04-26 DIAGNOSIS — I2510 Atherosclerotic heart disease of native coronary artery without angina pectoris: Secondary | ICD-10-CM | POA: Diagnosis not present

## 2018-04-26 DIAGNOSIS — E1122 Type 2 diabetes mellitus with diabetic chronic kidney disease: Secondary | ICD-10-CM | POA: Diagnosis not present

## 2018-04-26 DIAGNOSIS — I252 Old myocardial infarction: Secondary | ICD-10-CM | POA: Diagnosis not present

## 2018-09-15 DIAGNOSIS — N186 End stage renal disease: Secondary | ICD-10-CM | POA: Diagnosis not present

## 2018-09-15 DIAGNOSIS — K219 Gastro-esophageal reflux disease without esophagitis: Secondary | ICD-10-CM | POA: Diagnosis not present

## 2018-09-15 DIAGNOSIS — G4733 Obstructive sleep apnea (adult) (pediatric): Secondary | ICD-10-CM | POA: Diagnosis not present

## 2018-09-15 DIAGNOSIS — I2510 Atherosclerotic heart disease of native coronary artery without angina pectoris: Secondary | ICD-10-CM | POA: Diagnosis not present

## 2018-09-15 DIAGNOSIS — I2724 Chronic thromboembolic pulmonary hypertension: Secondary | ICD-10-CM | POA: Diagnosis not present

## 2018-09-15 DIAGNOSIS — Z951 Presence of aortocoronary bypass graft: Secondary | ICD-10-CM | POA: Diagnosis not present

## 2018-09-15 DIAGNOSIS — F17210 Nicotine dependence, cigarettes, uncomplicated: Secondary | ICD-10-CM | POA: Diagnosis not present

## 2018-09-15 DIAGNOSIS — Z9181 History of falling: Secondary | ICD-10-CM | POA: Diagnosis not present

## 2018-09-15 DIAGNOSIS — Z7901 Long term (current) use of anticoagulants: Secondary | ICD-10-CM | POA: Diagnosis not present

## 2018-09-15 DIAGNOSIS — I5042 Chronic combined systolic (congestive) and diastolic (congestive) heart failure: Secondary | ICD-10-CM | POA: Diagnosis not present

## 2018-09-15 DIAGNOSIS — M1990 Unspecified osteoarthritis, unspecified site: Secondary | ICD-10-CM | POA: Diagnosis not present

## 2018-09-15 DIAGNOSIS — F319 Bipolar disorder, unspecified: Secondary | ICD-10-CM | POA: Diagnosis not present

## 2018-09-15 DIAGNOSIS — Z8701 Personal history of pneumonia (recurrent): Secondary | ICD-10-CM | POA: Diagnosis not present

## 2018-09-15 DIAGNOSIS — F419 Anxiety disorder, unspecified: Secondary | ICD-10-CM | POA: Diagnosis not present

## 2018-09-15 DIAGNOSIS — E785 Hyperlipidemia, unspecified: Secondary | ICD-10-CM | POA: Diagnosis not present

## 2018-09-15 DIAGNOSIS — Z992 Dependence on renal dialysis: Secondary | ICD-10-CM | POA: Diagnosis not present

## 2018-09-15 DIAGNOSIS — I82412 Acute embolism and thrombosis of left femoral vein: Secondary | ICD-10-CM | POA: Diagnosis not present

## 2018-09-15 DIAGNOSIS — F259 Schizoaffective disorder, unspecified: Secondary | ICD-10-CM | POA: Diagnosis not present

## 2018-09-15 DIAGNOSIS — D631 Anemia in chronic kidney disease: Secondary | ICD-10-CM | POA: Diagnosis not present

## 2018-09-15 DIAGNOSIS — E1122 Type 2 diabetes mellitus with diabetic chronic kidney disease: Secondary | ICD-10-CM | POA: Diagnosis not present

## 2018-09-15 DIAGNOSIS — I132 Hypertensive heart and chronic kidney disease with heart failure and with stage 5 chronic kidney disease, or end stage renal disease: Secondary | ICD-10-CM | POA: Diagnosis not present

## 2018-09-15 DIAGNOSIS — E669 Obesity, unspecified: Secondary | ICD-10-CM | POA: Diagnosis not present

## 2018-09-15 DIAGNOSIS — Z7982 Long term (current) use of aspirin: Secondary | ICD-10-CM | POA: Diagnosis not present

## 2018-09-16 ENCOUNTER — Observation Stay (HOSPITAL_COMMUNITY)
Admission: EM | Admit: 2018-09-16 | Discharge: 2018-09-17 | Disposition: A | Discharge status: Home / Self Care | Payer: Medicare Other | Attending: Internal Medicine | Admitting: Internal Medicine

## 2018-09-16 ENCOUNTER — Other Ambulatory Visit: Payer: Self-pay

## 2018-09-16 ENCOUNTER — Encounter (HOSPITAL_COMMUNITY): Payer: Self-pay | Admitting: Emergency Medicine

## 2018-09-16 ENCOUNTER — Observation Stay (HOSPITAL_COMMUNITY): Payer: Medicare Other

## 2018-09-16 ENCOUNTER — Emergency Department (HOSPITAL_BASED_OUTPATIENT_CLINIC_OR_DEPARTMENT_OTHER): Payer: Medicare Other

## 2018-09-16 DIAGNOSIS — R079 Chest pain, unspecified: Secondary | ICD-10-CM | POA: Diagnosis not present

## 2018-09-16 DIAGNOSIS — E876 Hypokalemia: Secondary | ICD-10-CM | POA: Diagnosis not present

## 2018-09-16 DIAGNOSIS — Z833 Family history of diabetes mellitus: Secondary | ICD-10-CM | POA: Insufficient documentation

## 2018-09-16 DIAGNOSIS — I5042 Chronic combined systolic (congestive) and diastolic (congestive) heart failure: Secondary | ICD-10-CM | POA: Diagnosis not present

## 2018-09-16 DIAGNOSIS — Z992 Dependence on renal dialysis: Secondary | ICD-10-CM | POA: Diagnosis not present

## 2018-09-16 DIAGNOSIS — Z7951 Long term (current) use of inhaled steroids: Secondary | ICD-10-CM | POA: Insufficient documentation

## 2018-09-16 DIAGNOSIS — R0602 Shortness of breath: Secondary | ICD-10-CM | POA: Diagnosis not present

## 2018-09-16 DIAGNOSIS — R0789 Other chest pain: Principal | ICD-10-CM | POA: Insufficient documentation

## 2018-09-16 DIAGNOSIS — E11610 Type 2 diabetes mellitus with diabetic neuropathic arthropathy: Secondary | ICD-10-CM | POA: Insufficient documentation

## 2018-09-16 DIAGNOSIS — Z87891 Personal history of nicotine dependence: Secondary | ICD-10-CM | POA: Diagnosis not present

## 2018-09-16 DIAGNOSIS — Z7982 Long term (current) use of aspirin: Secondary | ICD-10-CM | POA: Insufficient documentation

## 2018-09-16 DIAGNOSIS — G4733 Obstructive sleep apnea (adult) (pediatric): Secondary | ICD-10-CM | POA: Diagnosis not present

## 2018-09-16 DIAGNOSIS — E1122 Type 2 diabetes mellitus with diabetic chronic kidney disease: Secondary | ICD-10-CM | POA: Diagnosis not present

## 2018-09-16 DIAGNOSIS — Z955 Presence of coronary angioplasty implant and graft: Secondary | ICD-10-CM | POA: Diagnosis not present

## 2018-09-16 DIAGNOSIS — I132 Hypertensive heart and chronic kidney disease with heart failure and with stage 5 chronic kidney disease, or end stage renal disease: Secondary | ICD-10-CM | POA: Insufficient documentation

## 2018-09-16 DIAGNOSIS — K5900 Constipation, unspecified: Secondary | ICD-10-CM | POA: Diagnosis not present

## 2018-09-16 DIAGNOSIS — Z79899 Other long term (current) drug therapy: Secondary | ICD-10-CM | POA: Insufficient documentation

## 2018-09-16 DIAGNOSIS — E785 Hyperlipidemia, unspecified: Secondary | ICD-10-CM | POA: Diagnosis not present

## 2018-09-16 DIAGNOSIS — I10 Essential (primary) hypertension: Secondary | ICD-10-CM | POA: Diagnosis not present

## 2018-09-16 DIAGNOSIS — Z7901 Long term (current) use of anticoagulants: Secondary | ICD-10-CM | POA: Insufficient documentation

## 2018-09-16 DIAGNOSIS — F319 Bipolar disorder, unspecified: Secondary | ICD-10-CM | POA: Diagnosis not present

## 2018-09-16 DIAGNOSIS — Z86718 Personal history of other venous thrombosis and embolism: Secondary | ICD-10-CM | POA: Insufficient documentation

## 2018-09-16 DIAGNOSIS — R072 Precordial pain: Secondary | ICD-10-CM

## 2018-09-16 DIAGNOSIS — K219 Gastro-esophageal reflux disease without esophagitis: Secondary | ICD-10-CM | POA: Diagnosis not present

## 2018-09-16 DIAGNOSIS — R110 Nausea: Secondary | ICD-10-CM | POA: Diagnosis not present

## 2018-09-16 DIAGNOSIS — Z794 Long term (current) use of insulin: Secondary | ICD-10-CM | POA: Insufficient documentation

## 2018-09-16 DIAGNOSIS — Z86711 Personal history of pulmonary embolism: Secondary | ICD-10-CM | POA: Insufficient documentation

## 2018-09-16 DIAGNOSIS — D631 Anemia in chronic kidney disease: Secondary | ICD-10-CM | POA: Diagnosis not present

## 2018-09-16 DIAGNOSIS — N186 End stage renal disease: Secondary | ICD-10-CM | POA: Diagnosis not present

## 2018-09-16 DIAGNOSIS — I208 Other forms of angina pectoris: Secondary | ICD-10-CM | POA: Diagnosis present

## 2018-09-16 DIAGNOSIS — Z8249 Family history of ischemic heart disease and other diseases of the circulatory system: Secondary | ICD-10-CM | POA: Insufficient documentation

## 2018-09-16 DIAGNOSIS — I25118 Atherosclerotic heart disease of native coronary artery with other forms of angina pectoris: Secondary | ICD-10-CM | POA: Diagnosis not present

## 2018-09-16 DIAGNOSIS — M7989 Other specified soft tissue disorders: Secondary | ICD-10-CM

## 2018-09-16 LAB — CBC WITH DIFFERENTIAL/PLATELET
Abs Immature Granulocytes: 0.03 10*3/uL (ref 0.00–0.07)
Basophils Absolute: 0.1 10*3/uL (ref 0.0–0.1)
Basophils Relative: 1 %
Eosinophils Absolute: 0.1 10*3/uL (ref 0.0–0.5)
Eosinophils Relative: 1 %
HCT: 35.8 % — ABNORMAL LOW (ref 36.0–46.0)
Hemoglobin: 11.2 g/dL — ABNORMAL LOW (ref 12.0–15.0)
Immature Granulocytes: 0 %
Lymphocytes Relative: 15 %
Lymphs Abs: 1.1 10*3/uL (ref 0.7–4.0)
MCH: 32.2 pg (ref 26.0–34.0)
MCHC: 31.3 g/dL (ref 30.0–36.0)
MCV: 102.9 fL — ABNORMAL HIGH (ref 80.0–100.0)
Monocytes Absolute: 0.6 10*3/uL (ref 0.1–1.0)
Monocytes Relative: 8 %
Neutro Abs: 5.3 10*3/uL (ref 1.7–7.7)
Neutrophils Relative %: 75 %
Platelets: 203 10*3/uL (ref 150–400)
RBC: 3.48 MIL/uL — ABNORMAL LOW (ref 3.87–5.11)
RDW: 19.9 % — ABNORMAL HIGH (ref 11.5–15.5)
WBC: 7.1 10*3/uL (ref 4.0–10.5)
nRBC: 0 % (ref 0.0–0.2)

## 2018-09-16 LAB — TROPONIN I
Troponin I: 0.05 ng/mL (ref <0.03)
Troponin I: 0.05 ng/mL (ref <0.03)
Troponin I: 0.05 ng/mL (ref <0.03)

## 2018-09-16 LAB — BASIC METABOLIC PANEL
Anion gap: 16 — ABNORMAL HIGH (ref 5–15)
BUN: 10 mg/dL (ref 8–23)
CO2: 30 mmol/L (ref 22–32)
Calcium: 9.2 mg/dL (ref 8.9–10.3)
Chloride: 94 mmol/L — ABNORMAL LOW (ref 98–111)
Creatinine, Ser: 3.01 mg/dL — ABNORMAL HIGH (ref 0.44–1.00)
GFR calc Af Amer: 18 mL/min — ABNORMAL LOW (ref >60)
GFR calc non Af Amer: 15 mL/min — ABNORMAL LOW (ref >60)
Glucose, Bld: 197 mg/dL — ABNORMAL HIGH (ref 70–99)
Potassium: 3.2 mmol/L — ABNORMAL LOW (ref 3.5–5.1)
Sodium: 140 mmol/L (ref 135–145)

## 2018-09-16 LAB — PROTIME-INR
INR: 1.6 — ABNORMAL HIGH (ref 0.8–1.2)
Prothrombin Time: 18.4 s — ABNORMAL HIGH (ref 11.4–15.2)

## 2018-09-16 LAB — D-DIMER, QUANTITATIVE: D-Dimer, Quant: <0.27 ug{FEU}/mL (ref 0.00–0.50)

## 2018-09-16 LAB — CBG MONITORING, ED: Glucose-Capillary: 152 mg/dL — ABNORMAL HIGH (ref 70–99)

## 2018-09-16 LAB — GLUCOSE, CAPILLARY
Glucose-Capillary: 127 mg/dL — ABNORMAL HIGH (ref 70–99)
Glucose-Capillary: 175 mg/dL — ABNORMAL HIGH (ref 70–99)

## 2018-09-16 LAB — MRSA PCR SCREENING: MRSA by PCR: NEGATIVE

## 2018-09-16 MED ORDER — HEPARIN BOLUS VIA INFUSION
2500.0000 [IU] | Freq: Once | INTRAVENOUS | Status: AC
  Administered 2018-09-16: 2500 [IU] via INTRAVENOUS
  Filled 2018-09-16: qty 2500

## 2018-09-16 MED ORDER — MELATONIN 3 MG PO TABS
9.0000 mg | ORAL_TABLET | Freq: Every evening | ORAL | Status: DC | PRN
  Administered 2018-09-17: 9 mg via ORAL
  Filled 2018-09-16 (×2): qty 3

## 2018-09-16 MED ORDER — PNEUMOCOCCAL VAC POLYVALENT 25 MCG/0.5ML IJ INJ
0.5000 mL | INJECTION | INTRAMUSCULAR | Status: DC
  Filled 2018-09-16: qty 0.5

## 2018-09-16 MED ORDER — PROMETHAZINE HCL 25 MG PO TABS: 12.5000 mg | ORAL_TABLET | Freq: Four times a day (QID) | ORAL | Status: DC | PRN

## 2018-09-16 MED ORDER — INSULIN DETEMIR 100 UNIT/ML ~~LOC~~ SOLN
15.0000 [IU] | Freq: Every day | SUBCUTANEOUS | Status: DC
  Administered 2018-09-16 – 2018-09-17 (×2): 15 [IU] via SUBCUTANEOUS
  Filled 2018-09-16 (×3): qty 0.15

## 2018-09-16 MED ORDER — INSULIN ASPART 100 UNIT/ML ~~LOC~~ SOLN
0.0000 [IU] | Freq: Three times a day (TID) | SUBCUTANEOUS | Status: DC
  Administered 2018-09-16: 2 [IU] via SUBCUTANEOUS
  Administered 2018-09-17: 3 [IU] via SUBCUTANEOUS

## 2018-09-16 MED ORDER — ENOXAPARIN SODIUM 30 MG/0.3ML ~~LOC~~ SOLN: 30.0000 mg | SUBCUTANEOUS | Status: DC

## 2018-09-16 MED ORDER — ASPIRIN 81 MG PO CHEW
81.0000 mg | CHEWABLE_TABLET | Freq: Every day | ORAL | Status: DC
  Administered 2018-09-16 – 2018-09-17 (×2): 81 mg via ORAL
  Filled 2018-09-16 (×2): qty 1

## 2018-09-16 MED ORDER — NITROGLYCERIN 0.4 MG SL SUBL
0.4000 mg | SUBLINGUAL_TABLET | SUBLINGUAL | Status: DC | PRN
  Administered 2018-09-16 (×4): 0.4 mg via SUBLINGUAL
  Filled 2018-09-16 (×3): qty 1

## 2018-09-16 MED ORDER — GABAPENTIN 100 MG PO CAPS
100.0000 mg | ORAL_CAPSULE | Freq: Every day | ORAL | Status: DC
  Administered 2018-09-17: 100 mg via ORAL
  Filled 2018-09-16: qty 1

## 2018-09-16 MED ORDER — ARIPIPRAZOLE 5 MG PO TABS
7.5000 mg | ORAL_TABLET | Freq: Every day | ORAL | Status: DC
  Administered 2018-09-16 – 2018-09-17 (×2): 7.5 mg via ORAL
  Filled 2018-09-16 (×2): qty 2

## 2018-09-16 MED ORDER — ACETAMINOPHEN 650 MG RE SUPP: 650.0000 mg | Freq: Four times a day (QID) | RECTAL | Status: DC | PRN

## 2018-09-16 MED ORDER — GABAPENTIN 100 MG PO CAPS
200.0000 mg | ORAL_CAPSULE | Freq: Every morning | ORAL | Status: DC
  Administered 2018-09-17: 10:00:00 200 mg via ORAL
  Filled 2018-09-16 (×2): qty 2

## 2018-09-16 MED ORDER — AMLODIPINE BESYLATE 2.5 MG PO TABS
5.0000 mg | ORAL_TABLET | Freq: Every day | ORAL | Status: DC
  Administered 2018-09-16 – 2018-09-17 (×2): 5 mg via ORAL
  Filled 2018-09-16 (×2): qty 2

## 2018-09-16 MED ORDER — METOPROLOL TARTRATE 25 MG PO TABS
50.0000 mg | ORAL_TABLET | Freq: Two times a day (BID) | ORAL | Status: DC
  Administered 2018-09-16 – 2018-09-17 (×3): 50 mg via ORAL
  Filled 2018-09-16 (×3): qty 2

## 2018-09-16 MED ORDER — HYDRALAZINE HCL 25 MG PO TABS
25.0000 mg | ORAL_TABLET | Freq: Every day | ORAL | Status: DC
  Filled 2018-09-16: qty 1

## 2018-09-16 MED ORDER — RANOLAZINE ER 500 MG PO TB12: 500.0000 mg | ORAL_TABLET | Freq: Two times a day (BID) | ORAL | Status: DC

## 2018-09-16 MED ORDER — MORPHINE SULFATE (PF) 4 MG/ML IV SOLN
4.0000 mg | Freq: Once | INTRAVENOUS | Status: AC
  Administered 2018-09-16: 4 mg via INTRAVENOUS
  Filled 2018-09-16: qty 1

## 2018-09-16 MED ORDER — ATORVASTATIN CALCIUM 10 MG PO TABS
20.0000 mg | ORAL_TABLET | Freq: Every day | ORAL | Status: DC
  Administered 2018-09-17: 20 mg via ORAL
  Filled 2018-09-16: qty 2

## 2018-09-16 MED ORDER — HEPARIN (PORCINE) 25000 UT/250ML-% IV SOLN
1300.0000 [IU]/h | INTRAVENOUS | Status: DC
  Administered 2018-09-16: 1100 [IU]/h via INTRAVENOUS
  Filled 2018-09-16: qty 250

## 2018-09-16 MED ORDER — PANTOPRAZOLE SODIUM 20 MG PO TBEC
20.0000 mg | DELAYED_RELEASE_TABLET | Freq: Every day | ORAL | Status: DC
  Administered 2018-09-16 – 2018-09-17 (×2): 20 mg via ORAL
  Filled 2018-09-16 (×2): qty 1

## 2018-09-16 MED ORDER — ISOSORBIDE MONONITRATE ER 30 MG PO TB24
60.0000 mg | ORAL_TABLET | Freq: Every day | ORAL | Status: DC
  Administered 2018-09-16 – 2018-09-17 (×2): 60 mg via ORAL
  Filled 2018-09-16 (×2): qty 2

## 2018-09-16 MED ORDER — SENNOSIDES-DOCUSATE SODIUM 8.6-50 MG PO TABS: 1.0000 | ORAL_TABLET | Freq: Every evening | ORAL | Status: DC | PRN

## 2018-09-16 MED ORDER — ACETAMINOPHEN 325 MG PO TABS: 650.0000 mg | ORAL_TABLET | Freq: Four times a day (QID) | ORAL | Status: DC | PRN

## 2018-09-16 NOTE — ED Notes (Signed)
Please call Judson Roch (daughter) for updates. [phone]  Daughter is awake of pt status up to this point and understands pt will be her overnight for chest pain ruleout.

## 2018-09-16 NOTE — H&P (Signed)
Date: 09/16/2018               Patient Name:  Robin [name] MRN: [id number]  DOB: [birth year] Age / Sex: 68 y.o., female   PCP: [facility] Service: Internal Medicine Teaching Service         Attending Physician: Dr. Heber [place], Rachel Moulds, DO    First Contact: Dr. Lonia Skinner Pager: [phone]  Second Contact: Dr. Lars Mage Pager: [phone]       After Hours (After 5p/  First Contact Pager: [phone]  weekends / holidays): Second Contact Pager: [phone]   Chief Complaint: Shortness of breath and chest pain  History of Present Illness: Robin [name] is a 68 y.o f with ESRD TTS, CAD (Cath in Aug 2019 with diffuse stenosis), HTN, bipolar affective disorder, DVT, DM, HLD presenting with chest pain and shortness of breath that started at the end of her dialysis session. Patient describes that pain as an aching sensation located over the left sided, radiates to the left upper extremity, improved with the nitroglycerin and morphine that she had received in the ED and nothing seems to worsen it. It was associated with nausea and shortness of breath. She had received 324 ASA and sublingual nitroglycerin with some improvement in her symptoms. She reports that she has been having this for awhile and that it can occur with exertion however does occur at rest as well.  Her daughter helps with her medications, she denies any recent changes to her medications however had recently been admitted to [facility]. She does report constipation, has not had a bowel movement in 3 days. She denies any fevers, chills, vomiting, light headedness, dizziness, diarrhea or other symptoms at this time. She denies any recent change in diet, sick contacts, or recent travel.    Patient was recently admitted at the [facility] for a hospitalization for DVT/PE, she had been treated and was discharged 8 days ago.  Spoke with daughter, she reports that patient was admitted 2 weeks ago for shortness of  breath and chest pain, she had previously had a blood clot in her left leg and had been on warfarin prior to that admission, last week she was noted to have a pulmonary embolism and was treated with lovenox and transitioned back to warfarin.  Daughter also reports that she was found to have hemoglobin of 5, it is unknown why she was losing blood but had to be get for units of red blood cells, she was supposed to have a colonoscopy follow-up when she was stable evaluate for blood loss.   Daughter also reports that patient needs significant assistance at home, she helps her with medications and ADLs, an aid comes for 2 hours 5 days out of the week to assist.   Patient was hospitalized in August 2019 with similar complaints during which time she had a LHC showing mild non-obstructive disease in proximal and mid LAD, heavily calcified circumflex with involvement of the intermediate branch which has a stent that was difficult to approach with pci.  She was started on medical therapy at that time, and to add Ranexa in she had recurrent angina. Last echo in March 2018 showed ef 50-55%, moderate lvh, no regional wall motion abnormalities, g2dd, mildly dilated left atrium.   ED Course: In the ED she was found to be afebrile, hypertensive to 160/90, respiration rate normal, heart rate ranged from 65-81. She was  given morphine and nitroglycerin and her chest pain improved.  EKG showed normal sinus rhythm, mild ST depressions in leads V4 to V6, this was seen on prior EKG.  Initial troponin was elevated to 0.05.  Labs significant for potassium 3.2, creatinine 3, CBC showed a WBC 7.1, hemoglobin 11.2, MCV 102.9, platelets 203. Initial troponin 0.05. Vascular ultrasound showed no evidence of DVT.  D-dimer was <0.27. PT 18, INR 1.6. ECK showed cardiomegaly, increased pulmonary vasculature, no evidence of acute infection. She had been given nitroglycerin and morphine and admitted to IM.    Meds:  No outpatient medications  have been marked as taking for the 09/16/18 encounter [name] Encounter).     Allergies: Allergies as of 09/16/2018   (No Known Allergies)   Past Medical History:  Diagnosis Date   Abdominal pain 09/09/2016   Acute delirium 11/18/2014   Acute encephalopathy    Acute on chronic combined systolic and diastolic heart failure ([place]) 01/21/2015   Acute on chronic respiratory failure with hypoxia ([place]) 09/09/2016   Acute renal failure superimposed on stage 4 chronic kidney disease ([place]) 09/09/2016   Acute respiratory failure with hypoxia ([place]) 11/29/2015   Anemia in chronic kidney disease 12/09/2014   Anxiety    Arthritis    Benign hypertension    Bipolar affective disorder ([place]) 12/09/2014   CAD (coronary artery disease), native coronary artery with 2 stents  11/17/2014   CAP (community acquired pneumonia) 03/13/2016   Charcot ankle 03/16/2015   Charcot foot due to diabetes mellitus ([place])    Chronic combined systolic and diastolic CHF (congestive heart failure) ([place]) 11/18/2014   Chronic kidney disease, stage V ([place])    Nephrologist is with [facility] (Dr. Tammi Klippel)   CKD (chronic kidney disease) 12/09/2014   Closed left ankle fracture 11/17/2014   Confusion 01/21/2015   Depression    Diabetes mellitus without complication ([place])    DVT (deep venous thrombosis) ([facility])    right lower leg   Dyslipidemia    Elevated troponin 09/09/2016   End stage renal disease on dialysis [facility])    Fracture dislocation of ankle 11/17/2014   GERD (gastroesophageal reflux disease)    Heart murmur    History of blood transfusion    History of bronchitis    History of pneumonia    Hyperlipidemia 03/26/2015   Hypertensive heart/renal disease with failure ([place]) 12/09/2014   Hypokalemia 11/17/2014   Obesity 11/17/2014   Onychomycosis 10/07/2016   Right leg DVT ([place]) 12/05/2015   Right leg swelling 11/29/2015   Shortness of breath dyspnea    SIRS (systemic inflammatory response  syndrome) ([place]) 08/08/2016   Sleep apnea    Type 2 diabetes mellitus ([place])    Unstable angina ([place]) 12/26/2017   Ventricular tachycardia ([place])     Family History: Family history of asthma, DM, and cardiac disease. Denies any other issues.   Social History: Quit smoking about 3 weeks ago, she reports that her kids don't buy her them any more. Denies any drinking or recreational drugs. Lives by herself at home, has an aid that comes in every day, she has multiple family members that live nearby and her daughter comes by to help with her medications.   Review of Systems: A complete ROS was negative except as per HPI.   Physical Exam: Blood pressure (!) 161/81, pulse 76, temperature 98.1 F (36.7 C), temperature source Oral, resp. rate 18, height 5\' 4"  (1.626 m), weight 70.3 kg, SpO2 98 %. Physical Exam  Constitutional: She is  oriented to person, place, and time and well-developed, well-nourished, and in no distress. No distress.  HENT:  Head: Normocephalic and atraumatic.  Eyes: Pupils are equal, round, and reactive to light. Conjunctivae and EOM are normal.  Neck: Normal range of motion. Neck supple. No thyromegaly present.  Cardiovascular: Normal rate, regular rhythm and normal heart sounds.  No murmur heard. Tender to palpation over left upper chest  Pulmonary/Chest: Effort normal and breath sounds normal. No respiratory distress.  Abdominal: Soft. Bowel sounds are normal. There is abdominal tenderness (minimal diffuse tenderness).  Musculoskeletal: Normal range of motion.        General: Edema (1-2+ BL LE edema) present.  Neurological: She is alert and oriented to person, place, and time.  Skin: Skin is warm and dry. No erythema.  Psychiatric: Mood and affect normal.    EKG: personally reviewed my interpretation is sinus rhythm, heart rate, ST depression in V4-V6 (seen on prior EKG) and T wave inversions in lateral leads  CXR: personally reviewed my interpretation is  cardiomegaly, increased pulmonary vasculature, no evidence of acute infection  Left heart Cath (Aug 2019):     Assessment & Plan by Problem: Active Problems:   Chest pain   Chest pain: Hx of CAD: This is a 68 year old female with history of CAD, ESRD on HD TTS, HTN, OSA, bipolar disorder, HLD, DM type 2 who presented during developing episode of chest pain during dialysis having intermittent short, located more over the left chest area with radiation down right arm, and associated with some nausea and shortness of breath.  She had improvement of her chest pain with nitroglycerin.  Found to be hemodynamically stable.  On exam she had reproducible chest pain, appearing comfortable, cardiac and pulmonary exam was unremarkable.  Labs significant for mildly elevated troponin of 0.05.  EKG showed some ST depressions in leads V4 to V6 that was seen on prior EKGs. Heart score of 6, and since she has some typical chest pain features there is a concern for ACS.  However pain is reproducible on exam symptoms could be more musculoskeletal in nature.  We will continue medical management and trend troponins. -Cardiology consulted, appreciate recommendations -Trend troponins -Telemetry -Metoprolol tartrate 50 mg twice daily -Imdur 60 mg daily -Amlodipine 5 mg daily -Continue aspirin and Lipitor -Nitro PRN for chest pain -BMP and CBC in AM -Volteran gel  -Repeat EKG in AM -Start heparin drip  Hx of DVT/PE: Patient daughter reported that patient was hospitalized last week for a PE, she had recently been on warfarin for RA left lower extremity DVT in detail told that the clot had spread to her lungs starting.  Patient last week.  She is currently on warfarin.  INR is subtherapeutic at 1.6 today.  Lower extremity ultrasound showed no evidence of DVT, d-dimer is negative.  Exam is not consistent with a DVT, has bilateral lower extremity edema.  Does not appear to be an acute issue.  Will need to obtain [facility]  records.  -Heparin per pharmacy -We will need to change back to warfarin when she is ready for discharge  HLD: She is on Lipitor at home, will continue this while inpatient  History of anemia: -Hemoglobin on admission was 11.2, MCV elevated at 102.  Daughter reported that she had a hemoglobin down to 5 on she was admitted at the [facility] hospital, required 4 units of packed red blood cells, they were not able to find what caused the bleeding and was patient is going to  have a colonoscopy outpatient.  We will continue to monitor her hemoglobin and vital signs for now. -Daily CBC -[facility]  Hypokalemia: ESRD on HD TTS: K today at 3.2, she had completed her dialysis session earlier today. Labs today show no urgent need for dialysis.   -May need nephrology consult if she stays until Saturday, will hold of for now.  -BMP in AM  DM:  Patient is on Levemir 10 units daily, and 3 to 15 units Humalog with meals.  Glucose was elevated to 197 on admission. -Sliding scale insulin -Levemir 15 units nightly -Gabapentin 200 AM, 100 mg PM -Frequent CBGs  Constipation: No bowel movements for 3 days, normally goes daily.  Does not regularly take pain medication, no recent changes in her diet. -Senokot  FEN: No fluids, replete lytes prn, renal diet VTE ppx: Heparin Code Status: FULL   Dispo: Admit patient to Observation with expected length of stay less than 2 midnights.  Signed: Asencion Noble, MD 09/16/2018, 4:17 PM  Pager: [phone]

## 2018-09-16 NOTE — Progress Notes (Addendum)
Pt was received to room from ED, telemetry verfied with NT box 15, sr with pvc, pt says chest ache left arm "6" and has been ongoing since she went for chest xray in ED but did not tell anybody, started cp protocl with NTG sl and stat EKG, paged MD to notify, I asked pt about her meds and history and she says she doesn't know that I need to ask daughter, she says she is not sure about a heart cath . Pt says pain is like it was last year whenever I was here.

## 2018-09-16 NOTE — ED Notes (Signed)
Pt CBG was 152, notified Elizabeth(RN)

## 2018-09-16 NOTE — ED Provider Notes (Signed)
[facility] EMERGENCY DEPARTMENT Provider Note   CSN: [id number] Arrival date & time: 09/16/18  1101    History   Chief Complaint Chief Complaint  Patient presents with   Chest Pain   Shortness of Breath    HPI Robin [name] is a 68 y.o. female with history of ESRD on dialysis Tuesday Thursday Saturday, CAD, hypertension, bipolar affective disorder, DVT, diabetes mellitus, hyperlipidemia, unstable angina presenting for evaluation of acute onset, constant left-sided chest pains beginning around 1 hour prior to arrival.  She reports that pain began within the last 20 minutes of her dialysis treatment.  She was unable to complete the treatment due to pain.  Pain is left-sided, burning, radiates into the left upper extremity.  It is associated with shortness of breath, nausea, and lightheadedness but no diaphoresis, syncope, or vomiting.  She denies any abdominal pain.  She is belching somewhat on my assessment.  No aggravating or alleviating factors noted.  She received 324 mg of aspirin and 3 nitroglycerin sublingually with some improvement in her pain from 8/10 in severity to 5/10 in severity.  She quit smoking 1 month ago.  She is unsure if she is on any blood thinners.  She stated that her cardiologist was at the [facility] but chart review shows that she has been seen by our cardiologists at an office visit September 2019. Last cath 12/28/17 showed mild non obstructive CAD LAD & RCA. Heavy calcified, eccentric, non flow limiting proximal Cx with mild to moderate restenosis of intermediate branch.  Medical therapy was recommended at the time with the addition of Ranexa.  She tells me that she noticed left lower extremity swelling and pain today.  She has a prior history of right lower extremity DVT she reports was diagnosed last month however chart review shows it was actually diagnosed July 2017.  She does not appear to be on any anticoagulation other than baby aspirin.  2:15PM RN  Meeks spoke with patient's daughter on the phone. She is patient's POA.  She reports the patient had a recent admission at the [place] and was "throwing clots ".  She was started on warfarin and is currently on a Lovenox bridge as her INR is still subtherapeutic.  The daughter is concerned that she may not be getting all of her medications at home.     The history is provided by the patient.    Past Medical History:  Diagnosis Date   Abdominal pain 09/09/2016   Acute delirium 11/18/2014   Acute encephalopathy    Acute on chronic combined systolic and diastolic heart failure ([place]) 01/21/2015   Acute on chronic respiratory failure with hypoxia ([place]) 09/09/2016   Acute renal failure superimposed on stage 4 chronic kidney disease ([place]) 09/09/2016   Acute respiratory failure with hypoxia ([place]) 11/29/2015   Anemia in chronic kidney disease 12/09/2014   Anxiety    Arthritis    Benign hypertension    Bipolar affective disorder ([place]) 12/09/2014   CAD (coronary artery disease), native coronary artery with 2 stents  11/17/2014   CAP (community acquired pneumonia) 03/13/2016   Charcot ankle 03/16/2015   Charcot foot due to diabetes mellitus ([place])    Chronic combined systolic and diastolic CHF (congestive heart failure) ([place]) 11/18/2014   Chronic kidney disease, stage V ([place])    Nephrologist is with [facility] (Dr. Tammi Klippel)   CKD (chronic kidney disease) 12/09/2014   Closed left ankle fracture 11/17/2014   Confusion 01/21/2015   Depression  Diabetes mellitus without complication ([place])    DVT (deep venous thrombosis) (HCC)    right lower leg   Dyslipidemia    Elevated troponin 09/09/2016   End stage renal disease on dialysis [facility])    Fracture dislocation of ankle 11/17/2014   GERD (gastroesophageal reflux disease)    Heart murmur    History of blood transfusion    History of bronchitis    History of pneumonia    Hyperlipidemia 03/26/2015   Hypertensive heart/renal  disease with failure ([place]) 12/09/2014   Hypokalemia 11/17/2014   Obesity 11/17/2014   Onychomycosis 10/07/2016   Right leg DVT ([place]) 12/05/2015   Right leg swelling 11/29/2015   Shortness of breath dyspnea    SIRS (systemic inflammatory response syndrome) ([place]) 08/08/2016   Sleep apnea    Type 2 diabetes mellitus ([place])    Unstable angina ([place]) 12/26/2017   Ventricular tachycardia ([place])     Patient Active Problem List   Diagnosis Date Noted   Chest pain 09/16/2018   Unstable angina (HCC) 12/26/2017   Dyslipidemia    End stage renal disease on dialysis ([place])    Onychomycosis 10/07/2016   Diabetic Charct's arthropathy ([place]) 10/07/2016   Acute on chronic respiratory failure with hypoxia (HCC) 09/09/2016   GERD (gastroesophageal reflux disease) 09/09/2016   Depression 09/09/2016   Acute renal failure superimposed on stage 4 chronic kidney disease ([place]) 09/09/2016   Chronic diastolic (congestive) heart failure ([place]) 09/09/2016   Elevated troponin 09/09/2016   Abdominal pain 09/09/2016   SIRS (systemic inflammatory response syndrome) ([place]) 08/08/2016   CHF (congestive heart failure) ([place]) 08/07/2016   Right leg DVT ([place]) 12/05/2015   Charcot foot due to diabetes mellitus ([place])    Hyperlipidemia 03/26/2015   Charcot ankle 03/16/2015   Confusion 01/21/2015   Anemia 01/21/2015   Multiple falls 01/21/2015   Anemia, chronic renal failure    Ventricular tachycardia (HCC)    Chronic renal disease, stage 4, severely decreased glomerular filtration rate between 15-29 mL/min/1.73 square meter ([place])    Anemia in chronic kidney disease 12/09/2014   CKD (chronic kidney disease) 12/09/2014   Hypertensive heart/renal disease with failure ([place]) 12/09/2014   Bipolar affective disorder ([place]) 12/09/2014   Acute encephalopathy    Acute delirium 11/18/2014   Type 2 diabetes mellitus with hyperglycemia ([place]) 11/18/2014   Essential hypertension 11/18/2014    Chronic combined systolic and diastolic CHF (congestive heart failure) ([place]) 11/18/2014   Fracture dislocation of ankle 11/17/2014   Diabetes mellitus type 2, insulin dependent ([place]) 11/17/2014   Hypokalemia 11/17/2014   Benign essential hypertension with delivery 11/17/2014   Obesity 11/17/2014   CAD (coronary artery disease), native coronary artery with 2 stents  11/17/2014    Past Surgical History:  Procedure Laterality Date   ABDOMINAL HYSTERECTOMY     ANKLE CLOSED REDUCTION N/A 11/17/2014   Procedure: CLOSED REDUCTION ANKLE;  Surgeon: Earlie Server, MD;  Location: [facility];  Service: Orthopedics;  Laterality: N/A;   ANKLE FUSION Left 03/16/2015   Procedure: Left Tibiocalcaneal Fusion;  Surgeon: Newt Minion, MD;  Location: [facility];  Service: Orthopedics;  Laterality: Left;   APPLICATION OF WOUND VAC Right 08/06/2016   Procedure: APPLICATION OF PREVENA WOUND VAC;  Surgeon: Newt Minion, MD;  Location: [facility];  Service: Orthopedics;  Laterality: Right;   AV FISTULA PLACEMENT Left [id number]   done at [place]     2 stent    CHOLECYSTECTOMY     CORONARY  STENT PLACEMENT     FOOT ARTHRODESIS Right 08/06/2016   Procedure: Right Foot Fusion Lisfranc Joint;  Surgeon: Newt Minion, MD;  Location: [facility];  Service: Orthopedics;  Laterality: Right;   HARDWARE REMOVAL Left 03/16/2015   Procedure: Removal Hardware Left Ankle;  Surgeon: Newt Minion, MD;  Location: [facility];  Service: Orthopedics;  Laterality: Left;   IR AV DIALY SHUNT INTRO NEEDLE/INTRACATH INITIAL W/PTA/IMG LEFT  01/05/2018   IR US GUIDE VASC ACCESS LEFT  01/05/2018   LEFT HEART CATH AND CORONARY ANGIOGRAPHY N/A 12/28/2017   Procedure: LEFT HEART CATH AND CORONARY ANGIOGRAPHY;  Surgeon: Burnell Blanks, MD;  Location: [facility] CV [facility];  Service: Cardiovascular;  Laterality: N/A;   ORIF ANKLE FRACTURE Left 11/20/2014   Procedure: OPEN REDUCTION INTERNAL FIXATION (ORIF) ANKLE  FRACTURE;  Surgeon: Renette Butters, MD;  Location: [facility];  Service: Orthopedics;  Laterality: Left;   TONSILLECTOMY       OB History   No obstetric history on file.      Home Medications    Prior to Admission medications   Medication Sig Start Date End Date Taking? Authorizing Provider  ammonium lactate (LAC-HYDRIN) 12 % lotion Apply 1 application topically 2 (two) times daily as needed (for itching/irritation).     [provider]  ARIPiprazole (ABILIFY) 15 MG tablet Take 7.5 mg by mouth daily.     [provider]  aspirin 81 MG chewable tablet Chew 1 tablet (81 mg total) by mouth daily. 12/30/17   Asencion Noble, MD  atorvastatin (LIPITOR) 40 MG tablet Take 20 mg by mouth at bedtime.     [provider]  dextrose (GLUTOSE) 40 % GEL Take 1 Tube by mouth once as needed (for blood sugar less than 60).     [provider]  docusate sodium (COLACE) 100 MG capsule Take 100 mg by mouth daily.     [provider]  gabapentin (NEURONTIN) 100 MG capsule Take 100-200 mg by mouth See admin instructions. Take 100mg   (one capsule) in the morning and 200mg  (2 capsules) at night    [provider]  hydrALAZINE (APRESOLINE) 50 MG tablet Take 25 mg by mouth daily.     [provider]  insulin detemir (LEVEMIR) 100 UNIT/ML injection Inject 0.08 mLs (8 Units total) into the skin at bedtime. Patient taking differently: Inject 25-40 Units into the skin See admin instructions. 25 units in the AM and 40 units at bedtime 09/15/16   Oswald Hillock, MD  insulin lispro (HUMALOG) 100 UNIT/ML injection Inject 3-15 Units into the skin 3 (three) times daily before meals. Pt uses as needed per sliding scale:    150-200:  3  units 201-250:  5 units 251-300:  8 units 301-350:  10 units 351-400:  12 units and call MD 400+ 15 units    [provider]  isosorbide mononitrate (IMDUR) 60 MG 24 hr tablet Take 60 mg by mouth daily.     [provider]  lidocaine (LMX) 4 % cream Apply 1 application topically 3 (three) times daily.    [provider]  Melatonin 5 MG TABS Take 10 mg by mouth at bedtime as needed (for sleep).     [provider]  metoprolol tartrate (LOPRESSOR) 50 MG tablet Take 50 mg by mouth 2 (two) times daily. Patient unsure of dose    [provider]  multivitamin (RENA-VIT) TABS tablet Take 1 tablet by mouth daily. 12/11/17  [provider]  nitroGLYCERIN (NITROSTAT) 0.4 MG SL tablet Place 0.4 mg under the tongue every 5 (five) minutes as needed for chest pain.    [provider]  pantoprazole (PROTONIX) 20 MG tablet Take 20 mg by mouth daily.     [provider]  ranolazine (RANEXA) 500 MG 12 hr tablet TAKE 1 TABLET BY MOUTH TWICE A DAY 02/23/18   Belva Crome, MD  sennosides-docusate sodium (SENOKOT-S) 8.6-50 MG tablet Take 2 tablets by mouth 2 (two) times daily as needed for constipation.     [provider]  sevelamer carbonate (RENVELA) 800 MG tablet Take 1,600 mg by mouth 3 (three) times daily with meals.    [provider]    Family History Family History  Family history unknown: Yes    Social History Social History   Tobacco Use   Smoking status: Current Every Day Smoker    Packs/day: 1.00    Types: Cigarettes   Smokeless tobacco: Never Used  Substance Use Topics   Alcohol use: No   Drug use: No     Allergies   Patient has no known allergies.   Review of Systems Review of Systems  Constitutional: Negative for chills and fever.  Respiratory: Positive for shortness of breath. Negative for cough.   Cardiovascular: Positive for chest pain and leg swelling.  Gastrointestinal: Positive for nausea. Negative for abdominal pain and vomiting.  Neurological: Positive for light-headedness. Negative for syncope.  All other systems reviewed and are negative.    Physical Exam Updated Vital Signs BP (!) 161/81 (BP  Location: Right Arm)    Pulse 76    Temp 98.1 F (36.7 C) (Oral)    Resp 18    Ht 5\' 4"  (1.626 m)    Wt 70.3 kg    SpO2 98%    BMI 26.61 kg/m   Physical Exam Vitals signs and nursing note reviewed.  Constitutional:      General: She is not in acute distress.    Appearance: She is well-developed.  HENT:     Head: Normocephalic and atraumatic.  Eyes:     General:        Right eye: No discharge.        Left eye: No discharge.     Conjunctiva/sclera: Conjunctivae normal.  Neck:     Vascular: No JVD.     Trachea: No tracheal deviation.  Cardiovascular:     Rate and Rhythm: Normal rate.     Pulses:          Carotid pulses are 2+ on the right side and 1+ on the left side.      Radial pulses are 2+ on the right side and 2+ on the left side.       Dorsalis pedis pulses are 2+ on the right side and 2+ on the left side.     Heart sounds: Murmur present.     Comments: Left AV fistula with palpable thrill. Mild left lower extremity swelling noted as compared to the right; Homan's sign present on the left. Compartments soft, no palpable cords.  Pulmonary:     Effort: Pulmonary effort is normal.     Comments: Speaking in full sentences without difficulty Chest:     Comments: Mild right lateral chest wall tenderness to palpation. No deformity, crepitus, ecchymosis, or flail segment noted.  Abdominal:     General: Bowel sounds are normal. There is no distension.     Palpations: Abdomen is soft.  Tenderness: There is no abdominal tenderness. There is no guarding or rebound.  Musculoskeletal:     Right lower leg: She exhibits no tenderness. No edema.     Left lower leg: She exhibits tenderness. Edema present.  Skin:    General: Skin is warm and dry.     Findings: No erythema.  Neurological:     Mental Status: She is alert.  Psychiatric:        Behavior: Behavior normal.      ED Treatments / Results  Labs (all labs ordered are listed, but only abnormal results are displayed) Labs  Reviewed  BASIC METABOLIC PANEL - Abnormal; Notable for the following components:      Result Value   Potassium 3.2 (*)    Chloride 94 (*)    Glucose, Bld 197 (*)    Creatinine, Ser 3.01 (*)    GFR calc non Af Amer 15 (*)    GFR calc Af Amer 18 (*)    Anion gap 16 (*)    All other components within normal limits  TROPONIN I - Abnormal; Notable for the following components:   Troponin I 0.05 (*)    All other components within normal limits  CBC WITH DIFFERENTIAL/PLATELET - Abnormal; Notable for the following components:   RBC 3.48 (*)    Hemoglobin 11.2 (*)    HCT 35.8 (*)    MCV 102.9 (*)    RDW 19.9 (*)    All other components within normal limits  PROTIME-INR - Abnormal; Notable for the following components:   Prothrombin Time 18.4 (*)    INR 1.6 (*)    All other components within normal limits  CBG MONITORING, ED - Abnormal; Notable for the following components:   Glucose-Capillary 152 (*)    All other components within normal limits  D-DIMER, QUANTITATIVE (NOT AT [facility])  TROPONIN I  TROPONIN I  TROPONIN I    EKG EKG Interpretation  Date/Time:  Thursday September 16 2018 11:04:14 EDT Ventricular Rate:  77 PR Interval:    QRS Duration: 96 QT Interval:  450 QTC Calculation: 510 R Axis:   -39 Text Interpretation:  Sinus rhythm Multiform ventricular premature complexes Abnormal R-wave progression, late transition LVH with secondary repolarization abnormality Prolonged QT interval No significant change since last tracing Confirmed by Duffy Bruce [phone]) on 09/16/2018 11:51:13 AM   Radiology Dg Chest 2 View  Result Date: 09/16/2018 CLINICAL DATA:  Chest pain and shortness of breath during dialysis. EXAM: CHEST - 2 VIEW COMPARISON:  12/26/2017 FINDINGS: Stable enlargement of the cardiac silhouette. Interstitial lung markings are mildly enlarged. Trachea is midline. Negative for a pneumothorax. No pleural effusions. Atherosclerotic calcifications in the aorta. Old fracture  of the left sixth rib. IMPRESSION: 1. Stable mild cardiomegaly. 2. Slightly increased interstitial lung markings. Findings could represent mild interstitial edema. No focal lung disease. Electronically Signed   By: Markus Daft M.D.   On: 09/16/2018 15:04   Vas [place] Lower Extremity Venous (dvt) (only Mc & Wl 7a-7p)  Result Date: 09/16/2018  Lower Venous Study Indications: Swelling.  Performing Technologist: Abram Sander RVS  Examination Guidelines: A complete evaluation includes B-mode imaging, spectral Doppler, color Doppler, [name] Bilateral testing is considered an integral part of a complete examination. Limited examinations for reoccurring indications may be performed as noted.  +-----+---------------+---------+-----------+----------+-------+  RIGHT Compressibility Phasicity Spontaneity Properties Summary  +-----+---------------+---------+-----------+----------+-------+  CFV   Full  Yes       Yes                             +-----+---------------+---------+-----------+----------+-------+   +---------+---------------+---------+-----------+----------+--------------+  LEFT      Compressibility Phasicity Spontaneity Properties Summary         +---------+---------------+---------+-----------+----------+--------------+  CFV       Full            Yes       Yes                                    +---------+---------------+---------+-----------+----------+--------------+  SFJ       Full                                                             +---------+---------------+---------+-----------+----------+--------------+  FV Prox   Full                                                             +---------+---------------+---------+-----------+----------+--------------+  FV Mid    Full                                                             +---------+---------------+---------+-----------+----------+--------------+  FV Distal Full                                                              +---------+---------------+---------+-----------+----------+--------------+  PFV       Full                                                             +---------+---------------+---------+-----------+----------+--------------+  POP       Full            Yes       Yes                                    +---------+---------------+---------+-----------+----------+--------------+  PTV       Full                                                             +---------+---------------+---------+-----------+----------+--------------+  PERO                                                       Not visualized  +---------+---------------+---------+-----------+----------+--------------+     Summary: Right: No evidence of common femoral vein obstruction. Left: There is no evidence of deep vein thrombosis in the lower extremity. However, portions of this examination were limited- see technologist comments above. No cystic structure found in the popliteal fossa.  *See table(s) above for measurements and observations.    Preliminary     Procedures Procedures (including critical care time)  Medications Ordered in ED Medications  nitroGLYCERIN (NITROSTAT) SL tablet 0.4 mg (0.4 mg Sublingual Given 09/16/18 1336)  acetaminophen (TYLENOL) tablet 650 mg (has no administration in time range)    Or  acetaminophen (TYLENOL) suppository 650 mg (has no administration in time range)  senna-docusate (Senokot-S) tablet 1 tablet (has no administration in time range)  promethazine (PHENERGAN) tablet 12.5 mg (has no administration in time range)  ARIPiprazole (ABILIFY) tablet 7.5 mg (has no administration in time range)  aspirin chewable tablet 81 mg (has no administration in time range)  atorvastatin (LIPITOR) tablet 20 mg (has no administration in time range)  isosorbide mononitrate (IMDUR) 24 hr tablet 60 mg (has no administration in time range)  Melatonin TABS 10 mg (has no  administration in time range)  pantoprazole (PROTONIX) EC tablet 20 mg (has no administration in time range)  ranolazine (RANEXA) 12 hr tablet 500 mg (has no administration in time range)  insulin aspart (novoLOG) injection 0-15 Units (has no administration in time range)  metoprolol tartrate (LOPRESSOR) tablet 50 mg (has no administration in time range)  hydrALAZINE (APRESOLINE) tablet 25 mg (has no administration in time range)  gabapentin (NEURONTIN) capsule 100-200 mg (has no administration in time range)  insulin detemir (LEVEMIR) injection 15 Units (has no administration in time range)  morphine 4 MG/ML injection 4 mg (4 mg Intravenous Given 09/16/18 1219)     Initial Impression / Assessment and Plan / ED Course  I have reviewed the triage vital signs and the nursing notes.  Pertinent labs & imaging results that were available during my care of the patient were reviewed by me and considered in my medical decision making (see chart for details).        Patient presenting for evaluation of acute onset left-sided chest pains beginning at the end of dialysis treatment earlier today.  She is afebrile, hypertensive in the ED but vital signs otherwise stable.  She is nontoxic in appearance.  Also complains of right lower extremity pain and swelling with history of DVT.  Will obtain lab work, chest x-ray, troponin, and DVT study and reassess.  EKG shows prolonged QT interval which appears unchanged from baseline, no other acute ischemic changes noted.  Chest x-ray shows stable cardiomegaly and mild nonspecific interstitial lung markings.  Lab work shows stable anemia, elevated creatinine consistent with her history of ESRD.  No indications for emergent dialysis and she completed almost a full treatment today.  D-dimer negative.  DVT study negative but portions of examination somewhat limited.  INR subtherapeutic.  Her troponin is mildly elevated but appears to be at baseline, likely secondary to  her chronic kidney disease. She underwent catheterization 12/28/2017 which showed up to 70% stenosis of multiple coronary arteries but the recommendation  was to manage her disease medically due to heavy calcification of the circumflex proximally.  She had some improvement in her chest pain with morphine and sublingual nitroglycerin but continues to complain of discomfort.  Would benefit from admission for chest pain rule out.  Internal medicine teaching service to admit.  Final Clinical Impressions(s) / ED Diagnoses   Final diagnoses:  Left-sided chest pain    ED Discharge Orders    None       Debroah Baller 09/16/18 1605    Duffy Bruce, MD 09/17/18 [phone]

## 2018-09-16 NOTE — Progress Notes (Signed)
ANTICOAGULATION CONSULT NOTE - Initial Consult  Pharmacy Consult for Heparin Indication: DVT  No Known Allergies  Patient Measurements: Height: 5\' 4"  (162.6 cm) Weight: 155 lb (70.3 kg) IBW/kg (Calculated) : 54.7 Heparin dosing wt: 70kg  Vital Signs: Temp: 98 F (36.7 C) (04/23 1107) Temp Source: Oral (04/23 1107) BP: 157/80 (04/23 1415) Pulse Rate: 78 (04/23 1400)  Labs: Recent Labs    09/16/18 1130  HGB 11.2*  HCT 35.8*  PLT 203  LABPROT 18.4*  INR 1.6*  CREATININE 3.01*  TROPONINI 0.05*    Estimated Creatinine Clearance: 17.7 mL/min (A) (by C-G formula based on SCr of 3.01 mg/dL (H)).   Medical History: Past Medical History:  Diagnosis Date  . Abdominal pain 09/09/2016  . Acute delirium 11/18/2014  . Acute encephalopathy   . Acute on chronic combined systolic and diastolic heart failure ([place]) 01/21/2015  . Acute on chronic respiratory failure with hypoxia ([place]) 09/09/2016  . Acute renal failure superimposed on stage 4 chronic kidney disease ([place]) 09/09/2016  . Acute respiratory failure with hypoxia ([place]) 11/29/2015  . Anemia in chronic kidney disease 12/09/2014  . Anxiety   . Arthritis   . Benign hypertension   . Bipolar affective disorder ([place]) 12/09/2014  . CAD (coronary artery disease), native coronary artery with 2 stents  11/17/2014  . CAP (community acquired pneumonia) 03/13/2016  . Charcot ankle 03/16/2015  . Charcot foot due to diabetes mellitus ([place])   . Chronic combined systolic and diastolic CHF (congestive heart failure) ([place]) 11/18/2014  . Chronic kidney disease, stage V [facility])    Nephrologist is with [facility] (Dr. Tammi Klippel)  . CKD (chronic kidney disease) 12/09/2014  . Closed left ankle fracture 11/17/2014  . Confusion 01/21/2015  . Depression   . Diabetes mellitus without complication ([place])   . DVT (deep venous thrombosis) (HCC)    right lower leg  . Dyslipidemia   . Elevated troponin 09/09/2016  . End stage renal disease on dialysis ([place])   .  Fracture dislocation of ankle 11/17/2014  . GERD (gastroesophageal reflux disease)   . Heart murmur   . History of blood transfusion   . History of bronchitis   . History of pneumonia   . Hyperlipidemia 03/26/2015  . Hypertensive heart/renal disease with failure ([place]) 12/09/2014  . Hypokalemia 11/17/2014  . Obesity 11/17/2014  . Onychomycosis 10/07/2016  . Right leg DVT ([place]) 12/05/2015  . Right leg swelling 11/29/2015  . Shortness of breath dyspnea   . SIRS (systemic inflammatory response syndrome) ([place]) 08/08/2016  . Sleep apnea   . Type 2 diabetes mellitus ([place])   . Unstable angina ([place]) 12/26/2017  . Ventricular tachycardia ([place])     Assessment: 54 [name] with DVT in last week, hx unclear as to current tx for DVT, report of lovenox + warfarin outpt (INR 1.6 on admission here) [facility], will start heparin here and IM to re-consult for warfarin when tx plan clear.  Chronic anemia of ESRD stable, plts 203.    Goal of Therapy:  Heparin level 0.3-0.7 units/ml Monitor platelets by anticoagulation protocol: Yes   Plan:  Heparin 2500 units IV x 1 (lower bolus d/t unclear tx PTA), and gtt at 1100 units/hr F/u 8 hour heparin level  Bertis Ruddy, PharmD Clinical Pharmacist Please check AMION for all [place] numbers 09/16/2018 3:27 PM

## 2018-09-16 NOTE — ED Triage Notes (Signed)
Pt arrives by [facility] with complaints of CP and SOB. Pt was at dialysis when she began to have chest pain which radiated down her left arm. SOB came with the CP. Pt had 30mins left of dialysis treatment. When  EMS arrives pain was 8/10. Gave 3 nitro with some relief 5/10. Pt shows no respiratory distress at this time.

## 2018-09-16 NOTE — ED Notes (Addendum)
ED TO INPATIENT HANDOFF REPORT  ED Nurse Name and Phone #: Kathlee Nations [phone]  S Name/Age/Gender Robin [name] 68 y.o. female Room/Bed: 019C/019C  Code Status   Code Status: Full Code  Home/SNF/Other Home Patient oriented to: self, place, time and situation Is this baseline? Yes   Triage Complete: Triage complete  Chief Complaint chest pain/ dialysis  Triage Note Pt arrives by [facility] with complaints of CP and SOB. Pt was at dialysis when she began to have chest pain which radiated down her left arm. SOB came with the CP. Pt had 73mins left of dialysis treatment. When  EMS arrives pain was 8/10. Gave 3 nitro with some relief 5/10. Pt shows no respiratory distress at this time.   Allergies No Known Allergies  Level of Care/Admitting Diagnosis ED Disposition    ED Disposition Condition Comment   Admit  Hospital Area: [place] [[id number]]  Level of Care: Telemetry Cardiac [103]  Covid Evaluation: N/A  Diagnosis: Chest pain [921194]  Admitting Physician: Bosie Helper  Attending Physician: HOFFMAN, ERIK C [[id number]  PT Class (Do Not Modify): Observation [104]  PT Acc Code (Do Not Modify): Observation [10022]       B Medical/Surgery History Past Medical History:  Diagnosis Date  . Abdominal pain 09/09/2016  . Acute delirium 11/18/2014  . Acute encephalopathy   . Acute on chronic combined systolic and diastolic heart failure ([place]) 01/21/2015  . Acute on chronic respiratory failure with hypoxia ([place]) 09/09/2016  . Acute renal failure superimposed on stage 4 chronic kidney disease ([place]) 09/09/2016  . Acute respiratory failure with hypoxia ([place]) 11/29/2015  . Anemia in chronic kidney disease 12/09/2014  . Anxiety   . Arthritis   . Benign hypertension   . Bipolar affective disorder ([place]) 12/09/2014  . CAD (coronary artery disease), native coronary artery with 2 stents  11/17/2014  . CAP (community acquired pneumonia) 03/13/2016  . Charcot ankle 03/16/2015  .  Charcot foot due to diabetes mellitus ([place])   . Chronic combined systolic and diastolic CHF (congestive heart failure) ([place]) 11/18/2014  . Chronic kidney disease, stage V [facility])    Nephrologist is with [facility] (Dr. Tammi Klippel)  . CKD (chronic kidney disease) 12/09/2014  . Closed left ankle fracture 11/17/2014  . Confusion 01/21/2015  . Depression   . Diabetes mellitus without complication ([place])   . DVT (deep venous thrombosis) (HCC)    right lower leg  . Dyslipidemia   . Elevated troponin 09/09/2016  . End stage renal disease on dialysis ([place])   . Fracture dislocation of ankle 11/17/2014  . GERD (gastroesophageal reflux disease)   . Heart murmur   . History of blood transfusion   . History of bronchitis   . History of pneumonia   . Hyperlipidemia 03/26/2015  . Hypertensive heart/renal disease with failure ([place]) 12/09/2014  . Hypokalemia 11/17/2014  . Obesity 11/17/2014  . Onychomycosis 10/07/2016  . Right leg DVT ([place]) 12/05/2015  . Right leg swelling 11/29/2015  . Shortness of breath dyspnea   . SIRS (systemic inflammatory response syndrome) ([place]) 08/08/2016  . Sleep apnea   . Type 2 diabetes mellitus ([place])   . Unstable angina ([place]) 12/26/2017  . Ventricular tachycardia [name])    Past Surgical History:  Procedure Laterality Date  . ABDOMINAL HYSTERECTOMY    . ANKLE CLOSED REDUCTION N/A 11/17/2014   Procedure: CLOSED REDUCTION ANKLE;  Surgeon: Earlie Server, MD;  Location: [place];  Service: Orthopedics;  Laterality: N/A;  . ANKLE  FUSION Left 03/16/2015   Procedure: Left Tibiocalcaneal Fusion;  Surgeon: Newt Minion, MD;  Location: [facility];  Service: Orthopedics;  Laterality: Left;  . APPLICATION OF WOUND VAC Right 08/06/2016   Procedure: APPLICATION OF PREVENA WOUND VAC;  Surgeon: Newt Minion, MD;  Location: [place];  Service: Orthopedics;  Laterality: Right;  . AV FISTULA PLACEMENT Left [id number]   done at [facility]     2 stent   . CHOLECYSTECTOMY    . CORONARY  STENT PLACEMENT    . FOOT ARTHRODESIS Right 08/06/2016   Procedure: Right Foot Fusion Lisfranc Joint;  Surgeon: Newt Minion, MD;  Location: [place];  Service: Orthopedics;  Laterality: Right;  . HARDWARE REMOVAL Left 03/16/2015   Procedure: Removal Hardware Left Ankle;  Surgeon: Newt Minion, MD;  Location: [place];  Service: Orthopedics;  Laterality: Left;  . IR AV DIALY SHUNT INTRO NEEDLE/INTRACATH INITIAL W/PTA/IMG LEFT  01/05/2018  . IR US GUIDE VASC ACCESS LEFT  01/05/2018  . LEFT HEART CATH AND CORONARY ANGIOGRAPHY N/A 12/28/2017   Procedure: LEFT HEART CATH AND CORONARY ANGIOGRAPHY;  Surgeon: Burnell Blanks, MD;  Location: [facility];  Service: Cardiovascular;  Laterality: N/A;  . ORIF ANKLE FRACTURE Left 11/20/2014   Procedure: OPEN REDUCTION INTERNAL FIXATION (ORIF) ANKLE FRACTURE;  Surgeon: Renette Butters, MD;  Location: [place];  Service: Orthopedics;  Laterality: Left;  . TONSILLECTOMY       A IV Location/Drains/Wounds Patient Lines/Drains/Airways Status   Active Line/Drains/Airways    Name:   Placement date:   Placement time:   Site:   Days:   Peripheral IV 09/16/18 Right;Upper Arm   09/16/18    1218    Arm   less than 1   Fistula / Graft Left Upper arm Arteriovenous fistula   09/30/14    -    Upper arm   1447   Fistula / Graft Left Upper arm Arteriovenous fistula   -    -    Upper arm      Incision (Closed) 03/16/15 Leg Left   03/16/15    1346     1280   Incision (Closed) 08/06/16 Foot Right   08/06/16    0915     771   Incision (Closed) 09/09/16 Foot Right   09/09/16    0924     737          Intake/Output Last 24 hours No intake or output data in the 24 hours ending 09/16/18 1444  Labs/Imaging Results for orders placed or performed during the hospital encounter of 09/16/18 (from the past 48 hour(s))  Basic metabolic panel     Status: Abnormal   Collection Time: 09/16/18 11:30 AM  Result Value Ref Range   Sodium 140 135 - 145 mmol/L   Potassium 3.2 (L)  3.5 - 5.1 mmol/L   Chloride 94 (L) 98 - 111 mmol/L   CO2 30 22 - 32 mmol/L   Glucose, Bld 197 (H) 70 - 99 mg/dL   BUN 10 8 - 23 mg/dL   Creatinine, Ser 3.01 (H) 0.44 - 1.00 mg/dL   Calcium 9.2 8.9 - 10.3 mg/dL   GFR calc non Af Amer 15 (L) >60 mL/min   GFR calc Af Amer 18 (L) >60 mL/min   Anion gap 16 (H) 5 - 15    Comment: Performed at [facility], [address], [place], [place] [address]  Troponin I -  Once     Status: Abnormal   Collection Time: 09/16/18 11:30 AM  Result Value Ref Range   Troponin I 0.05 (HH) <0.03 ng/mL    Comment: CRITICAL RESULT CALLED TO, READ BACK BY AND VERIFIED WITH: Donata Duff 1248 09/16/2018 D BRADLEY Performed at [facility] [address] [place], [place] [address]   D-dimer, quantitative     Status: None   Collection Time: 09/16/18 11:30 AM  Result Value Ref Range   D-Dimer, Quant <0.27 0.00 - 0.50 ug/mL-FEU    Comment: (NOTE) At the manufacturer cut-off of 0.50 ug/mL FEU, this assay has been documented to exclude PE with a sensitivity and negative predictive value of 97 to 99%.  At this time, this assay has not been approved by the FDA to exclude DVT/VTE. Results should be correlated with clinical presentation. Performed at [facility], [place] [address], [place], [place] [address]   CBC with Differential     Status: Abnormal   Collection Time: 09/16/18 11:30 AM  Result Value Ref Range   WBC 7.1 4.0 - 10.5 K/uL   RBC 3.48 (L) 3.87 - 5.11 MIL/uL   Hemoglobin 11.2 (L) 12.0 - 15.0 g/dL   HCT 35.8 (L) 36.0 - 46.0 %   MCV 102.9 (H) 80.0 - 100.0 fL   MCH 32.2 26.0 - 34.0 pg   MCHC 31.3 30.0 - 36.0 g/dL   RDW 19.9 (H) 11.5 - 15.5 %   Platelets 203 150 - 400 K/uL   nRBC 0.0 0.0 - 0.2 %   Neutrophils Relative % 75 %   Neutro Abs 5.3 1.7 - 7.7 K/uL   Lymphocytes Relative 15 %   Lymphs Abs 1.1 0.7 - 4.0 K/uL   Monocytes Relative 8 %   Monocytes Absolute 0.6 0.1 - 1.0 K/uL   Eosinophils Relative 1 %   Eosinophils Absolute 0.1  0.0 - 0.5 K/uL   Basophils Relative 1 %   Basophils Absolute 0.1 0.0 - 0.1 K/uL   Immature Granulocytes 0 %   Abs Immature Granulocytes 0.03 0.00 - 0.07 K/uL    Comment: Performed at [facility] [address], [place], [place] [address]  Protime-INR     Status: Abnormal   Collection Time: 09/16/18 11:30 AM  Result Value Ref Range   Prothrombin Time 18.4 (H) 11.4 - 15.2 seconds   INR 1.6 (H) 0.8 - 1.2    Comment: (NOTE) INR goal varies based on device and disease states. Performed at [facility], [place] [address] [place], [place] [address]   CBG monitoring, ED     Status: Abnormal   Collection Time: 09/16/18  2:23 PM  Result Value Ref Range   Glucose-Capillary 152 (H) 70 - 99 mg/dL   Comment 1 Notify RN    Comment 2 Document in Chart    Vas [place] Lower Extremity Venous (dvt) (only Mc & Wl 7a-7p)  Result Date: 09/16/2018  Lower Venous Study Indications: Swelling.  Performing Technologist: Abram Sander RVS  Examination Guidelines: A complete evaluation includes B-mode imaging, spectral Doppler, color Doppler, [name] Bilateral testing is considered an integral part of a complete examination. Limited examinations for reoccurring indications may be performed as noted.  +-----+---------------+---------+-----------+----------+-------+ RIGHTCompressibilityPhasicitySpontaneityPropertiesSummary +-----+---------------+---------+-----------+----------+-------+ CFV  Full           Yes      Yes                          +-----+---------------+---------+-----------+----------+-------+   +---------+---------------+---------+-----------+----------+--------------+  LEFT     CompressibilityPhasicitySpontaneityPropertiesSummary        +---------+---------------+---------+-----------+----------+--------------+ CFV      Full           Yes      Yes                                  +---------+---------------+---------+-----------+----------+--------------+ SFJ      Full                                                        +---------+---------------+---------+-----------+----------+--------------+ FV Prox  Full                                                        +---------+---------------+---------+-----------+----------+--------------+ FV Mid   Full                                                        +---------+---------------+---------+-----------+----------+--------------+ FV DistalFull                                                        +---------+---------------+---------+-----------+----------+--------------+ PFV      Full                                                        +---------+---------------+---------+-----------+----------+--------------+ POP      Full           Yes      Yes                                 +---------+---------------+---------+-----------+----------+--------------+ PTV      Full                                                        +---------+---------------+---------+-----------+----------+--------------+ PERO                                                  Not visualized +---------+---------------+---------+-----------+----------+--------------+     Summary: Right: No evidence of common femoral vein obstruction. Left: There is no evidence of deep vein thrombosis in the lower extremity. However, portions of this examination were limited- see technologist comments above. No cystic structure found in the popliteal fossa.  *See table(s) above for  measurements and observations.    Preliminary     Pending Labs Unresulted Labs (From admission, onward)    Start     Ordered   09/17/18 9476  Basic metabolic panel  Tomorrow morning,   R     09/16/18 1440   09/17/18 0500  CBC  Tomorrow morning,   R     09/16/18 1440          Vitals/Pain Today's Vitals   09/16/18 1339 09/16/18 1345  09/16/18 1400 09/16/18 1415  BP: (!) 164/89 (!) 153/95 (!) 153/95 (!) 157/80  Pulse: 71 80 78   Resp: 15 16 (!) 21 (!) 21  Temp:      TempSrc:      SpO2: 93% 96% 96%   Weight:      Height:      PainSc:        Isolation Precautions No active isolations  Medications Medications  nitroGLYCERIN (NITROSTAT) SL tablet 0.4 mg (0.4 mg Sublingual Given 09/16/18 1336)  enoxaparin (LOVENOX) injection 30 mg (has no administration in time range)  acetaminophen (TYLENOL) tablet 650 mg (has no administration in time range)    Or  acetaminophen (TYLENOL) suppository 650 mg (has no administration in time range)  senna-docusate (Senokot-S) tablet 1 tablet (has no administration in time range)  promethazine (PHENERGAN) tablet 12.5 mg (has no administration in time range)  ARIPiprazole (ABILIFY) tablet 7.5 mg (has no administration in time range)  aspirin chewable tablet 81 mg (has no administration in time range)  atorvastatin (LIPITOR) tablet 20 mg (has no administration in time range)  isosorbide mononitrate (IMDUR) 24 hr tablet 60 mg (has no administration in time range)  Melatonin TABS 10 mg (has no administration in time range)  pantoprazole (PROTONIX) EC tablet 20 mg (has no administration in time range)  ranolazine (RANEXA) 12 hr tablet 500 mg (has no administration in time range)  insulin aspart (novoLOG) injection 0-15 Units (has no administration in time range)  morphine 4 MG/ML injection 4 mg (4 mg Intravenous Given 09/16/18 1219)    Mobility walks with person assist Low fall risk   Focused Assessments Cardiac Assessment Handoff:  Cardiac Rhythm: Normal sinus rhythm Lab Results  Component Value Date   CKTOTAL 258 (H) 11/29/2009   CKMB (HH) 11/29/2009    7.1 CRITICAL VALUE NOTED.  VALUE IS CONSISTENT WITH PREVIOUSLY REPORTED AND CALLED VALUE.   TROPONINI 0.05 (HH) 09/16/2018   Lab Results  Component Value Date   DDIMER <0.27 09/16/2018   Does the Patient currently have  chest pain? No     R Recommendations: See Admitting Provider Note  Report given to: 3E RN  Additional Notes: Please keep [name] (daughter,POA) updated

## 2018-09-16 NOTE — ED Notes (Signed)
Pt states she is not in pain however she feels uncomfortable and can not put into words what "uncomfortable" is . Pt was repositioned in bed which seems to have made her feel more comfortable.

## 2018-09-16 NOTE — Consult Note (Addendum)
Cardiology Consultation:   [name] MRN: [id number]; DOB: [birth year]  Admit date: 09/16/2018 Date of Consult: 09/16/2018  Primary Care Provider: [facility] [place] Primary Cardiologist: Sinclair Grooms, MD  Primary Electrophysiologist:  None    Patient Profile:   Robin [name] is a 68 y.o. female with a hx of  CAD, ESRD on HD TThS, HTN, OSA, bipolar affective disorder, DVT, HLD and DM II who is being seen today for the evaluation of chest pain at the request of Dr. Heber [place].  History of Present Illness:   [name] is a 68 year old female with past medical history of CAD, ESRD on HD TThS, HTN, OSA, bipolar affective disorder, DVT, HLD and DM II. Her last echocardiogram was performed on 08/09/2016 which showed EF 50 to 55%, moderate LVH, grade 2 DD, mild MR.  Patient was last admitted with chest pain in August 2019.  Cardiac catheterization at the time showed mild nonobstructive CAD involving LAD and RCA, 40% ostial ramus lesion, 65% ostial to proximal left circumflex lesion, 50% ostial D1, 70% ostial D2, EF 45 to 50%.  Medical management was recommended.  He was last seen in the office in September 2019, at which time she complained of chest discomfort or shortness of breath that occurs once every 3 weeks.  This seems to be controlled on nitrate.  Ranexa 500 mg twice daily was added to her medical regimen.    Patient presented to the hospital today for evaluation of chest pain that began near the end of her dialysis treatment.  She was unable to complete the treatment due to the pain.  She received 324 mg aspirin and 3 sublingual nitroglycerin with mild improvement in her symptoms.  Her ED, her blood pressure was actually high at 161/88.  Per ED report, although patient had a prior history of right lower extremity DVT in 2017, she was recently admitted at [facility] hospital for blood clot and was restarted on Coumadin with Lovenox bridge.  On arrival to [facility], her  creatinine was 3.0.  Troponin was borderline elevated at 0.05.  Hemoglobin 11.2.  INR was 1.6.  EKG showed normal sinus rhythm with T wave inversion in lateral leads.  Venous Doppler performed today did not reveal any obvious evidence of DVT.  Cardiology has been consulted for chest pain.    Past Medical History:  Diagnosis Date   Abdominal pain 09/09/2016   Acute delirium 11/18/2014   Acute encephalopathy    Acute on chronic combined systolic and diastolic heart failure ([place]) 01/21/2015   Acute on chronic respiratory failure with hypoxia ([place]) 09/09/2016   Acute renal failure superimposed on stage 4 chronic kidney disease ([place]) 09/09/2016   Acute respiratory failure with hypoxia ([place]) 11/29/2015   Anemia in chronic kidney disease 12/09/2014   Anxiety    Arthritis    Benign hypertension    Bipolar affective disorder ([place]) 12/09/2014   CAD (coronary artery disease), native coronary artery with 2 stents  11/17/2014   CAP (community acquired pneumonia) 03/13/2016   Charcot ankle 03/16/2015   Charcot foot due to diabetes mellitus ([place])    Chronic combined systolic and diastolic CHF (congestive heart failure) ([place]) 11/18/2014   Chronic kidney disease, stage V ([place])    Nephrologist is with [facility] (Dr. Tammi Klippel)   CKD (chronic kidney disease) 12/09/2014   Closed left ankle fracture 11/17/2014   Confusion 01/21/2015   Depression    Diabetes mellitus without complication ([place])  DVT (deep venous thrombosis) ([facility])    right lower leg   Dyslipidemia    Elevated troponin 09/09/2016   End stage renal disease on dialysis [facility])    Fracture dislocation of ankle 11/17/2014   GERD (gastroesophageal reflux disease)    Heart murmur    History of blood transfusion    History of bronchitis    History of pneumonia    Hyperlipidemia 03/26/2015   Hypertensive heart/renal disease with failure ([place]) 12/09/2014   Hypokalemia 11/17/2014   Obesity 11/17/2014   Onychomycosis  10/07/2016   Right leg DVT ([place]) 12/05/2015   Right leg swelling 11/29/2015   Shortness of breath dyspnea    SIRS (systemic inflammatory response syndrome) ([place]) 08/08/2016   Sleep apnea    Type 2 diabetes mellitus ([place])    Unstable angina ([place]) 12/26/2017   Ventricular tachycardia ([place])     Past Surgical History:  Procedure Laterality Date   ABDOMINAL HYSTERECTOMY     ANKLE CLOSED REDUCTION N/A 11/17/2014   Procedure: CLOSED REDUCTION ANKLE;  Surgeon: Earlie Server, MD;  Location: [place];  Service: Orthopedics;  Laterality: N/A;   ANKLE FUSION Left 03/16/2015   Procedure: Left Tibiocalcaneal Fusion;  Surgeon: Newt Minion, MD;  Location: [place];  Service: Orthopedics;  Laterality: Left;   APPLICATION OF WOUND VAC Right 08/06/2016   Procedure: APPLICATION OF PREVENA WOUND VAC;  Surgeon: Newt Minion, MD;  Location: [place];  Service: Orthopedics;  Laterality: Right;   AV FISTULA PLACEMENT Left [id number]   done at [place]     2 stent    [place] Right 08/06/2016   Procedure: Right Foot Fusion Lisfranc Joint;  Surgeon: Newt Minion, MD;  Location: [place];  Service: Orthopedics;  Laterality: Right;   HARDWARE REMOVAL Left 03/16/2015   Procedure: Removal Hardware Left Ankle;  Surgeon: Newt Minion, MD;  Location: [place];  Service: Orthopedics;  Laterality: Left;   IR AV DIALY SHUNT INTRO NEEDLE/INTRACATH INITIAL W/PTA/IMG LEFT  01/05/2018   IR US GUIDE VASC ACCESS LEFT  01/05/2018   LEFT HEART CATH AND CORONARY ANGIOGRAPHY N/A 12/28/2017   Procedure: LEFT HEART CATH AND CORONARY ANGIOGRAPHY;  Surgeon: Burnell Blanks, MD;  Location: [place] [facility];  Service: Cardiovascular;  Laterality: N/A;   ORIF ANKLE FRACTURE Left 11/20/2014   Procedure: OPEN REDUCTION INTERNAL FIXATION (ORIF) ANKLE FRACTURE;  Surgeon: Renette Butters, MD;  Location: [place];  Service: Orthopedics;  Laterality:  Left;   TONSILLECTOMY       Home Medications:  Prior to Admission medications   Medication Sig Start Date End Date Taking? Authorizing Provider  acetaminophen (TYLENOL) 325 MG tablet Take 325-975 mg by mouth every 6 (six) hours as needed for mild pain or headache.   Yes [provider]  albuterol (VENTOLIN HFA) 108 (90 Base) MCG/ACT inhaler Inhale 1-2 puffs into the lungs every 6 (six) hours as needed for wheezing or shortness of breath.   Yes [provider]  ammonium lactate (LAC-HYDRIN) 12 % lotion Apply 1 application topically 2 (two) times daily as needed (for itching/irritation).    Yes [provider]  ARIPiprazole (ABILIFY) 15 MG tablet Take 7.5 mg by mouth daily.    Yes [provider]  aspirin 81 MG chewable tablet Chew 1 tablet (81 mg total) by mouth daily. 12/30/17  Yes Asencion Noble, MD  atorvastatin (LIPITOR) 40  MG tablet Take 20 mg by mouth at bedtime.    Yes [provider]  dextrose (GLUTOSE) 40 % GEL Take 1 Tube by mouth once as needed (for blood sugar less than 60).    Yes [provider]  enoxaparin (LOVENOX) 80 MG/0.8ML injection Inject 80 mg into the skin See admin instructions. Today 09/16/18 and Tomorrow 09/17/18 ONLY   Yes [provider]  gabapentin (NEURONTIN) 100 MG capsule Take 100-200 mg by mouth See admin instructions. Take 200mg   (two capsule) in the morning and 100mg  (1 capsules) at night   Yes [provider]  hydrALAZINE (APRESOLINE) 50 MG tablet Take 25 mg by mouth daily.    Yes [provider]  insulin detemir (LEVEMIR) 100 UNIT/ML injection Inject 0.08 mLs (8 Units total) into the skin at bedtime. Patient taking differently: Inject 10 Units into the skin daily with lunch.  09/15/16  Yes Darrick Meigs, Marge Duncans, MD  insulin lispro (HUMALOG) 100 UNIT/ML injection Inject 3-15 Units into the skin 3 (three) times daily before meals. Pt uses as needed per sliding scale:    150-200:  3   units 201-250:  5 units 251-300:  8 units 301-350:  10 units 351-400:  12 units and call MD 400+ 15 units   Yes [provider]  isosorbide mononitrate (IMDUR) 60 MG 24 hr tablet Take 60 mg by mouth daily.    Yes [provider]  Melatonin 5 MG TABS Take 10 mg by mouth at bedtime.    Yes [provider]  metoprolol tartrate (LOPRESSOR) 50 MG tablet Take 75 mg by mouth 2 (two) times daily.    Yes [provider]  multivitamin (RENA-VIT) TABS tablet Take 1 tablet by mouth at bedtime.  12/11/17  Yes [provider]  nitroGLYCERIN (NITROSTAT) 0.4 MG SL tablet Place 0.4 mg under the tongue every 5 (five) minutes as needed for chest pain.   Yes [provider]  pantoprazole (PROTONIX) 20 MG tablet Take 40 mg by mouth 2 (two) times daily.    Yes [provider]  warfarin (COUMADIN) 10 MG tablet Take 10 mg by mouth once.   Yes [provider]  warfarin (COUMADIN) 5 MG tablet Take 5-7.5 mg by mouth See admin instructions. Take 1 1/2 tablet (7.5mg ) all days except on Mon and Wed take 1 tablet (5mg ).   Yes [provider]  ranolazine (RANEXA) 500 MG 12 hr tablet TAKE 1 TABLET BY MOUTH TWICE A DAY Patient not taking: Reported on 09/16/2018 02/23/18   Belva Crome, MD    Inpatient Medications: Scheduled Meds:  ARIPiprazole  7.5 mg Oral Daily   aspirin  81 mg Oral Daily   atorvastatin  20 mg Oral QHS   gabapentin  100-200 mg Oral See admin instructions   hydrALAZINE  25 mg Oral Daily   insulin aspart  0-15 Units Subcutaneous TID WC   insulin detemir  15 Units Subcutaneous Daily   isosorbide mononitrate  60 mg Oral Daily   metoprolol tartrate  50 mg Oral BID   pantoprazole  20 mg Oral Daily   ranolazine  500 mg Oral BID   Continuous Infusions:  PRN Meds: acetaminophen **OR** acetaminophen, Melatonin, nitroGLYCERIN, promethazine, senna-docusate  Allergies:   No Known Allergies  Social History:   Social  History   Socioeconomic History   Marital status: Divorced    Spouse name: Not on file   Number of children: Not on file   Years of education:  Not on file   Highest education level: Not on file  Occupational History   Not on file  Social Needs   Financial resource strain: Not on file   Food insecurity:    Worry: Not on file    Inability: Not on file   Transportation needs:    Medical: Not on file    Non-medical: Not on file  Tobacco Use   Smoking status: Current Every Day Smoker    Packs/day: 1.00    Types: Cigarettes   Smokeless tobacco: Never Used  Substance and Sexual Activity   Alcohol use: No   Drug use: No   Sexual activity: Never  Lifestyle   Physical activity:    Days per week: Not on file    Minutes per session: Not on file   Stress: Not on file  Relationships   Social connections:    Talks on phone: Not on file    Gets together: Not on file    Attends religious service: Not on file    Active member of club or organization: Not on file    Attends meetings of clubs or organizations: Not on file    Relationship status: Not on file   Intimate partner violence:    Fear of current or ex partner: Not on file    Emotionally abused: Not on file    Physically abused: Not on file    Forced sexual activity: Not on file  Other Topics Concern   Not on file  Social History Narrative   Not on file    Family History:    Family History  Family history unknown: Yes     ROS:  Please see the history of present illness.  Patient denies fevers, chills, productive cough or hemoptysis. All other ROS reviewed and negative.     Physical Exam/Data:   Vitals:   09/16/18 1400 09/16/18 1415 09/16/18 1548 09/16/18 1613  BP: (!) 153/95 (!) 157/80 (!) 161/81 (!) 164/86  Pulse: 78  76 73  Resp: (!) 21 (!) 21 18   Temp:   98.1 F (36.7 C)   TempSrc:   Oral   SpO2: 96%  98%   Weight:      Height:       No intake or output data in the 24 hours ending  09/16/18 1629 Last 3 Weights 09/16/2018 01/27/2018 12/29/2017  Weight (lbs) 155 lb 160 lb 154 lb 6.4 oz  Weight (kg) 70.308 kg 72.576 kg 70.035 kg     Body mass index is 26.61 kg/m.  General:  Well nourished, well developed, in no acute distress HEENT: normal Neck: no JVD Endocrine:  No thryomegaly Vascular: No carotid bruits; FA pulses 2+ bilaterally without bruits  Cardiac:  normal S1, S2; RRR; no murmur  Lungs:  clear to auscultation bilaterally, no wheezing, rhonchi or rales  Abd: soft, nontender, no hepatomegaly  Ext: no edema Musculoskeletal:  No deformities, BUE and BLE strength normal and equal Skin: warm and dry  Neuro:  CNs 2-12 intact, no focal abnormalities noted Psych:  Flat affect  EKG:  The EKG was personally reviewed and demonstrates: Normal sinus rhythm with T wave reversion in lateral leads.  Relevant CV Studies:  Echo 08/09/2016 - Left ventricle: The cavity size was normal. Wall thickness was   increased in a pattern of moderate LVH. Systolic function was   normal. The estimated ejection fraction was in the range of 50%   to 55%. Wall motion was normal; there were  no regional wall   motion abnormalities. Features are consistent with a pseudonormal   left ventricular filling pattern, with concomitant abnormal   relaxation and increased filling pressure (grade 2 diastolic   dysfunction). - Mitral valve: There was mild regurgitation. - Left atrium: The atrium was mildly dilated. - Right atrium: The atrium was mildly dilated   Cath 12/28/2017  Prox RCA lesion is 20% stenosed.  Ost Ramus to Ramus lesion is 40% stenosed.  Ost Cx to Prox Cx lesion is 65% stenosed.  Prox Cx to Mid Cx lesion is 20% stenosed.  Ost LAD to Mid LAD lesion is 25% stenosed.  Ost 1st Diag lesion is 50% stenosed.  Ost 2nd Diag lesion is 70% stenosed.  Mid RCA to Dist RCA lesion is 30% stenosed.  The left ventricular systolic function is normal.  LV end diastolic pressure is  normal.  The left ventricular ejection fraction is 45-50% by visual estimate.  There is no mitral valve regurgitation.   1. Mild non-obstructive disease in the proximal and mid LAD 2. Moderate, heavily calcified proximal Circumflex stenosis. This appears to be an eccentric lesion and is not felt to be flow limiting. The moderate caliber intermediate branch arises from this lesion. The intermediate branch has a proximal stent that is patent with mild to moderate stent restenosis 3. The RCA is a moderate caliber co-dominant vessel with mild disease in the mid and distal segments.  4. Inferior wall hypokinesis with mild LV systolic dysfunction.   Recommendations: She has no obstructive disease in the LAD or RCA. Her Circumflex is heavily calcified proximally with moderate eccentric stenosis involving the moderate caliber intermediate branch. It is unclear how far back the intermediate stent extends. I would recommend medical management for now regarding the ostial/proximal stenosis. This lesion would be difficult to approach with PCI given the heavy calcification and the involvement of the intermediate branch which has a stent that extends back to the Circumflex. If she has recurrent angina, would consider addition of Ranexa.   Laboratory Data:  Chemistry Recent Labs  Lab 09/16/18 1130  NA 140  K 3.2*  CL 94*  CO2 30  GLUCOSE 197*  BUN 10  CREATININE 3.01*  CALCIUM 9.2  GFRNONAA 15*  GFRAA 18*  ANIONGAP 16*    Hematology Recent Labs  Lab 09/16/18 1130  WBC 7.1  RBC 3.48*  HGB 11.2*  HCT 35.8*  MCV 102.9*  MCH 32.2  MCHC 31.3  RDW 19.9*  PLT 203   Cardiac Enzymes Recent Labs  Lab 09/16/18 1130  TROPONINI 0.05*   DDimer  Recent Labs  Lab 09/16/18 1130  DDIMER <0.27    Radiology/Studies:  Dg Chest 2 View  Result Date: 09/16/2018 CLINICAL DATA:  Chest pain and shortness of breath during dialysis. EXAM: CHEST - 2 VIEW COMPARISON:  12/26/2017 FINDINGS: Stable  enlargement of the cardiac silhouette. Interstitial lung markings are mildly enlarged. Trachea is midline. Negative for a pneumothorax. No pleural effusions. Atherosclerotic calcifications in the aorta. Old fracture of the left sixth rib. IMPRESSION: 1. Stable mild cardiomegaly. 2. Slightly increased interstitial lung markings. Findings could represent mild interstitial edema. No focal lung disease. Electronically Signed   By: Markus Daft M.D.   On: 09/16/2018 15:04   Vas [place] Lower Extremity Venous (dvt) (only Mc & Wl 7a-7p)  Result Date: 09/16/2018  Lower Venous Study Indications: Swelling.  Performing Technologist: Abram Sander RVS  Examination Guidelines: A complete evaluation includes B-mode imaging, spectral Doppler, color Doppler, and power  Doppler as needed of all accessible portions of each vessel. Bilateral testing is considered an integral part of a complete examination. Limited examinations for reoccurring indications may be performed as noted.  +-----+---------------+---------+-----------+----------+-------+  RIGHT Compressibility Phasicity Spontaneity Properties Summary  +-----+---------------+---------+-----------+----------+-------+  CFV   Full            Yes       Yes                             +-----+---------------+---------+-----------+----------+-------+   +---------+---------------+---------+-----------+----------+--------------+  LEFT      Compressibility Phasicity Spontaneity Properties Summary         +---------+---------------+---------+-----------+----------+--------------+  CFV       Full            Yes       Yes                                    +---------+---------------+---------+-----------+----------+--------------+  SFJ       Full                                                             +---------+---------------+---------+-----------+----------+--------------+  FV Prox   Full                                                              +---------+---------------+---------+-----------+----------+--------------+  FV Mid    Full                                                             +---------+---------------+---------+-----------+----------+--------------+  FV Distal Full                                                             +---------+---------------+---------+-----------+----------+--------------+  PFV       Full                                                             +---------+---------------+---------+-----------+----------+--------------+  POP       Full            Yes       Yes                                    +---------+---------------+---------+-----------+----------+--------------+  PTV       Full                                                             +---------+---------------+---------+-----------+----------+--------------+  PERO                                                       Not visualized  +---------+---------------+---------+-----------+----------+--------------+     Summary: Right: No evidence of common femoral vein obstruction. Left: There is no evidence of deep vein thrombosis in the lower extremity. However, portions of this examination were limited- see technologist comments above. No cystic structure found in the popliteal fossa.  *See table(s) above for measurements and observations.    Preliminary     Assessment and Plan:   1. Chest pain: EKG continue to show T wave inversions in lateral leads unchanged compared to previous admission, she does have ST depression in lead V6 however not in consecutive leads.  MD to assess the characteristic of the chest pain.  2. CAD: Last cardiac catheterization was in August 2019, patient had 65% ostial to proximal left circumflex lesion, 40% ostial ramus lesion, 70% ostial D2 and 50% ostial D1  3. Hypertension: Despite dialysis this morning, her blood pressure was actually elevated on arrival.  4. Hyperlipidemia: On Lipitor  5. DM II  6. DVT: She  has a history of right lower extremity DVT.  However based on ED report, daughter mentions that patient was recently admitted at [facility] and was restarted on Coumadin for clotting issues.   7. End-stage renal disease on HD       For questions or updates, please contact [place] Please consult [email or web address] for contact info under   As above, patient seen and examined.  Briefly she is a 68 year old female with past medical history of coronary artery disease, end-stage renal disease dialysis dependent, diabetes mellitus, hypertension, hyperlipidemia for evaluation of chest pain.  Patient has had previous PCI though all records not available.  She has had multiple catheterizations in the past.  Last catheterization August 2019 showed 65% ostial to proximal circumflex, 70% diagonal and otherwise no obstructive coronary disease.  Ejection fraction 45 to 50%.  The circumflex lesion was not felt to be flow-limiting.  It was felt that it would be difficult to approach with PCI and medical therapy indicated.  Patient is a difficult historian but apparently has had intermittent chest pain for years.  Today on dialysis she developed chest pain that is in the left breast area radiating to her left upper extremity.  It was described as an aching sensation.  There is no nausea, diaphoresis or dyspnea.  She states she can have chest pain at night that is worse when she sits up and also with inspiration.  Her chest pain has improved though continues to be mild.  She does have some dyspnea on exertion.  She also states her chest pain improves with walking.  She was admitted and cardiology asked to evaluate.  Initial troponin 0 0.05.  Hemoglobin is 11.2.  D-dimer is negative.  Electrocardiogram shows sinus rhythm with PVCs, prolonged QT interval, lateral T wave inversion less prominent compared to previous.  1 chest pain-etiology of pain is unclear.  Some of her symptoms appear to be chronic.  There is some reproduction  of chest pain with palpation as well.  She states her pain can increase when she must sit up for 2 to 3 hours and also with inspiration.  Electrocardiogram with no new ST changes.  Initial troponin normal.  Would continue to cycle enzymes.  If negative will treat medically as she had a recent catheterization as outlined above and medical therapy recommended.  Continue beta-blocker and isosorbide.  Blood pressure is elevated.  Would add amlodipine 5 mg daily for antianginal purposes.  Given prolonged QT interval would not add Ranexa.  2 end-stage renal disease-dialysis per nephrology.  3 hypertension-blood pressure is elevated.  Will discontinue hydralazine.  I would instead treat with amlodipine both for blood pressure and antianginal effects.  4 hyperlipidemia-continue statin.  5 coronary artery disease-continue aspirin and statin.  Kirk Ruths, MD

## 2018-09-16 NOTE — Progress Notes (Signed)
Lower extremity venous has been completed.   Preliminary results in CV Proc.   Abram Sander 09/16/2018 12:43 PM

## 2018-09-17 DIAGNOSIS — Z7901 Long term (current) use of anticoagulants: Secondary | ICD-10-CM

## 2018-09-17 DIAGNOSIS — Z7982 Long term (current) use of aspirin: Secondary | ICD-10-CM

## 2018-09-17 DIAGNOSIS — Z79899 Other long term (current) drug therapy: Secondary | ICD-10-CM

## 2018-09-17 DIAGNOSIS — G4733 Obstructive sleep apnea (adult) (pediatric): Secondary | ICD-10-CM

## 2018-09-17 DIAGNOSIS — R079 Chest pain, unspecified: Secondary | ICD-10-CM

## 2018-09-17 DIAGNOSIS — N186 End stage renal disease: Secondary | ICD-10-CM | POA: Diagnosis not present

## 2018-09-17 DIAGNOSIS — E876 Hypokalemia: Secondary | ICD-10-CM | POA: Diagnosis not present

## 2018-09-17 DIAGNOSIS — Z86711 Personal history of pulmonary embolism: Secondary | ICD-10-CM

## 2018-09-17 DIAGNOSIS — R072 Precordial pain: Secondary | ICD-10-CM | POA: Diagnosis not present

## 2018-09-17 DIAGNOSIS — Z992 Dependence on renal dialysis: Secondary | ICD-10-CM

## 2018-09-17 DIAGNOSIS — I25118 Atherosclerotic heart disease of native coronary artery with other forms of angina pectoris: Secondary | ICD-10-CM | POA: Diagnosis not present

## 2018-09-17 DIAGNOSIS — E785 Hyperlipidemia, unspecified: Secondary | ICD-10-CM | POA: Diagnosis not present

## 2018-09-17 DIAGNOSIS — Z794 Long term (current) use of insulin: Secondary | ICD-10-CM

## 2018-09-17 DIAGNOSIS — Z86718 Personal history of other venous thrombosis and embolism: Secondary | ICD-10-CM

## 2018-09-17 DIAGNOSIS — F319 Bipolar disorder, unspecified: Secondary | ICD-10-CM | POA: Diagnosis not present

## 2018-09-17 DIAGNOSIS — Z9889 Other specified postprocedural states: Secondary | ICD-10-CM

## 2018-09-17 DIAGNOSIS — Z862 Personal history of diseases of the blood and blood-forming organs and certain disorders involving the immune mechanism: Secondary | ICD-10-CM | POA: Diagnosis not present

## 2018-09-17 DIAGNOSIS — I120 Hypertensive chronic kidney disease with stage 5 chronic kidney disease or end stage renal disease: Secondary | ICD-10-CM | POA: Diagnosis not present

## 2018-09-17 DIAGNOSIS — E1122 Type 2 diabetes mellitus with diabetic chronic kidney disease: Secondary | ICD-10-CM | POA: Diagnosis not present

## 2018-09-17 LAB — BASIC METABOLIC PANEL
Anion gap: 16 — ABNORMAL HIGH (ref 5–15)
BUN: 25 mg/dL — ABNORMAL HIGH (ref 8–23)
CO2: 26 mmol/L (ref 22–32)
Calcium: 9.5 mg/dL (ref 8.9–10.3)
Chloride: 96 mmol/L — ABNORMAL LOW (ref 98–111)
Creatinine, Ser: 4.87 mg/dL — ABNORMAL HIGH (ref 0.44–1.00)
GFR calc Af Amer: 10 mL/min — ABNORMAL LOW (ref >60)
GFR calc non Af Amer: 9 mL/min — ABNORMAL LOW (ref >60)
Glucose, Bld: 99 mg/dL (ref 70–99)
Potassium: 4.1 mmol/L (ref 3.5–5.1)
Sodium: 138 mmol/L (ref 135–145)

## 2018-09-17 LAB — CBC
HCT: 30.1 % — ABNORMAL LOW (ref 36.0–46.0)
Hemoglobin: 9.5 g/dL — ABNORMAL LOW (ref 12.0–15.0)
MCH: 32.3 pg (ref 26.0–34.0)
MCHC: 31.6 g/dL (ref 30.0–36.0)
MCV: 102.4 fL — ABNORMAL HIGH (ref 80.0–100.0)
Platelets: 196 10*3/uL (ref 150–400)
RBC: 2.94 MIL/uL — ABNORMAL LOW (ref 3.87–5.11)
RDW: 19.8 % — ABNORMAL HIGH (ref 11.5–15.5)
WBC: 6.7 10*3/uL (ref 4.0–10.5)
nRBC: 0 % (ref 0.0–0.2)

## 2018-09-17 LAB — GLUCOSE, CAPILLARY
Glucose-Capillary: 95 mg/dL (ref 70–99)
Glucose-Capillary: 198 mg/dL — ABNORMAL HIGH (ref 70–99)

## 2018-09-17 LAB — HEPARIN LEVEL (UNFRACTIONATED): Heparin Unfractionated: <0.1 IU/mL — ABNORMAL LOW (ref 0.30–0.70)

## 2018-09-17 LAB — TROPONIN I: Troponin I: 0.05 ng/mL (ref <0.03)

## 2018-09-17 MED ORDER — AMLODIPINE BESYLATE 5 MG PO TABS: 5.0000 mg | ORAL_TABLET | Freq: Every day | ORAL | 1 refills | Status: DC

## 2018-09-17 MED ORDER — ENOXAPARIN SODIUM 80 MG/0.8ML ~~LOC~~ SOLN
80.0000 mg | SUBCUTANEOUS | Status: AC
  Administered 2018-09-17: 11:00:00 80 mg via SUBCUTANEOUS
  Filled 2018-09-17: qty 0.8

## 2018-09-17 MED ORDER — WARFARIN SODIUM 10 MG PO TABS: 10.0000 mg | ORAL_TABLET | Freq: Every day | ORAL | 0 refills | Status: DC

## 2018-09-17 MED ORDER — WARFARIN SODIUM 10 MG PO TABS: 10.0000 mg | ORAL_TABLET | Freq: Every day | ORAL | Status: DC

## 2018-09-17 MED ORDER — WARFARIN - PHARMACIST DOSING INPATIENT: Freq: Every day | Status: DC

## 2018-09-17 MED ORDER — DICLOFENAC SODIUM 1 % TD GEL
2.0000 g | Freq: Four times a day (QID) | TRANSDERMAL | Status: DC
  Administered 2018-09-17: 2 g via TOPICAL
  Filled 2018-09-17: qty 100

## 2018-09-17 MED ORDER — ENOXAPARIN SODIUM 80 MG/0.8ML ~~LOC~~ SOLN: 80.0000 mg | Freq: Every day | SUBCUTANEOUS | Status: DC

## 2018-09-17 MED ORDER — ENOXAPARIN SODIUM 80 MG/0.8ML ~~LOC~~ SOLN: 80.0000 mg | Freq: Every day | SUBCUTANEOUS | 0 refills | Status: DC

## 2018-09-17 NOTE — Progress Notes (Signed)
Per rounding MDs, okay for patient to have no IV access at this time. May be discharged later today and not getting any IV medications.

## 2018-09-17 NOTE — Progress Notes (Signed)
ANTICOAGULATION CONSULT NOTE - Initial Consult  Pharmacy Consult for Hep>Lovenox + Coumadin Indication: PE and DVT  No Known Allergies  Patient Measurements: Height: 5\' 4"  (162.6 cm) Weight: 165 lb 3.2 oz (74.9 kg) IBW/kg (Calculated) : 54.7  Vital Signs: Temp: 98.1 F (36.7 C) (04/24 0425) Temp Source: Oral (04/24 0425) BP: 134/69 (04/24 1018) Pulse Rate: 70 (04/24 1018)  Labs: Recent Labs    09/16/18 1130 09/16/18 1602 09/16/18 2030 09/17/18 0030 09/17/18 0328  HGB 11.2*  --   --   --  9.5*  HCT 35.8*  --   --   --  30.1*  PLT 203  --   --   --  196  LABPROT 18.4*  --   --   --   --   INR 1.6*  --   --   --   --   HEPARINUNFRC  --   --   --  <0.10*  --   CREATININE 3.01*  --   --   --  4.87*  TROPONINI 0.05* 0.05* 0.05*  --  0.05*    Estimated Creatinine Clearance: 11.3 mL/min (A) (by C-G formula based on SCr of 4.87 mg/dL (H)).   Medical History: Past Medical History:  Diagnosis Date  . Acute delirium 11/18/2014  . Acute encephalopathy   . Acute on chronic respiratory failure with hypoxia ([place]) 09/09/2016  . Acute respiratory failure with hypoxia ([place]) 11/29/2015  . Anemia in chronic kidney disease 12/09/2014  . Anxiety   . Arthritis   . Benign hypertension   . Bipolar affective disorder ([place]) 12/09/2014  . CAD (coronary artery disease), native coronary artery with 2 stents  11/17/2014  . Charcot foot due to diabetes mellitus ([place])   . Chronic combined systolic and diastolic CHF (congestive heart failure) ([place]) 11/18/2014  . Closed left ankle fracture 11/17/2014  . Confusion 01/21/2015  . Depression   . Diabetes mellitus without complication ([place])   . End stage renal disease on dialysis ([place])   . Fracture dislocation of ankle 11/17/2014  . GERD (gastroesophageal reflux disease)   . Heart murmur   . History of blood transfusion   . History of bronchitis   . History of pneumonia   . Hyperlipidemia 03/26/2015  . Hypertensive heart/renal disease with failure  ([place]) 12/09/2014  . Obesity 11/17/2014  . Onychomycosis 10/07/2016  . Right leg DVT ([place]) 12/05/2015  . SIRS (systemic inflammatory response syndrome) ([place]) 08/08/2016  . Sleep apnea   . Ventricular tachycardia (H[facility])     Assessment:  Anticoag: new PE in last week at [facility] hospital, on LMWH/Coumadin bridge.  Ddimer negative. Chronic anemia. Hgb 11.2>9.5. Plts 196 stable. Venous Doppler performed 4/23 did not reveal any obvious evidence of DVT.  - PTA LMWH 80mg  x 2d and Coumadin (5mg  M/W and 7.5mg  all other day) but 10mg  x 1 4/23 only with admit INR 1.6.  Goal of Therapy:  INR 2-3 Monitor platelets by anticoagulation protocol: Yes   Plan:  Discussed plan with MD resident. Lovenox 80mg  prior to discharge, then 80mg  daily until INR>2. Discharge home on Coumadin 10mg  daily until Monday [facility]   Athena Baltz S. Alford Highland, PharmD, BCPS Clinical Staff Pharmacist Eilene Ghazi Stillinger 09/17/2018,10:59 AM

## 2018-09-17 NOTE — Discharge Summary (Signed)
Name: Robin [name] MRN: [id number] DOB: 1951/04/18 68 y.o. PCP: [facility] [place]  Date of Admission: 09/16/2018 11:01 AM Date of Discharge: 09/17/2018 Attending Physician: Joni Reining C  Discharge Diagnosis: 1. Chest pain 2/2 stable angina  2. Hx of ACS 3. ESRD on HD TTS 4. Hypokalemia 5. Hx of DVT/PE 6. DM  Discharge Medications: Allergies as of 09/17/2018   No Known Allergies     Medication List    STOP taking these medications   hydrALAZINE 50 MG tablet Commonly known as:  APRESOLINE   ranolazine 500 MG 12 hr tablet Commonly known as:  RANEXA     TAKE these medications   acetaminophen 325 MG tablet Commonly known as:  TYLENOL Take 325-975 mg by mouth every 6 (six) hours as needed for mild pain or headache.   albuterol 108 (90 Base) MCG/ACT inhaler Commonly known as:  VENTOLIN HFA Inhale 1-2 puffs into the lungs every 6 (six) hours as needed for wheezing or shortness of breath.   amLODipine 5 MG tablet Commonly known as:  NORVASC Take 1 tablet (5 mg total) by mouth daily.   ammonium lactate 12 % lotion Commonly known as:  LAC-HYDRIN Apply 1 application topically 2 (two) times daily as needed (for itching/irritation).   ARIPiprazole 15 MG tablet Commonly known as:  ABILIFY Take 7.5 mg by mouth daily.   aspirin 81 MG chewable tablet Chew 1 tablet (81 mg total) by mouth daily.   atorvastatin 40 MG tablet Commonly known as:  LIPITOR Take 20 mg by mouth at bedtime.   dextrose 40 % Gel Commonly known as:  GLUTOSE Take 1 Tube by mouth once as needed (for blood sugar less than 60).   enoxaparin 80 MG/0.8ML injection Commonly known as:  LOVENOX Inject 0.8 mLs (80 mg total) into the skin daily for 7 days. What changed:    when to take this  additional instructions   gabapentin 100 MG capsule Commonly known as:  NEURONTIN Take 100-200 mg by mouth See admin instructions. Take 200mg   (two capsule) in the morning and 100mg  (1 capsules) at  night   insulin detemir 100 UNIT/ML injection Commonly known as:  LEVEMIR Inject 0.08 mLs (8 Units total) into the skin at bedtime. What changed:    how much to take  when to take this   insulin lispro 100 UNIT/ML injection Commonly known as:  HUMALOG Inject 3-15 Units into the skin 3 (three) times daily before meals. Pt uses as needed per sliding scale:    150-200:  3  units 201-250:  5 units 251-300:  8 units 301-350:  10 units 351-400:  12 units and call MD 400+ 15 units   isosorbide mononitrate 60 MG 24 hr tablet Commonly known as:  IMDUR Take 60 mg by mouth daily.   Melatonin 5 MG Tabs Take 10 mg by mouth at bedtime.   metoprolol tartrate 50 MG tablet Commonly known as:  LOPRESSOR Take 75 mg by mouth 2 (two) times daily.   multivitamin Tabs tablet Take 1 tablet by mouth at bedtime.   nitroGLYCERIN 0.4 MG SL tablet Commonly known as:  NITROSTAT Place 0.4 mg under the tongue every 5 (five) minutes as needed for chest pain.   pantoprazole 20 MG tablet Commonly known as:  PROTONIX Take 40 mg by mouth 2 (two) times daily.   warfarin 10 MG tablet Commonly known as:  COUMADIN Take 1 tablet (10 mg total) by mouth daily at 6 PM for 5  days. What changed:    medication strength  how much to take  when to take this  additional instructions  Another medication with the same name was removed. Continue taking this medication, and follow the directions you see here.       Disposition and follow-up:   Robin [name] was discharged from [facility] in Stable condition.  At the hospital follow up visit please address:  1.  Chest pain: Likely from stable angina vs MSK, please make sure she follows up with cardiology. On ASA, lipitor 40 mg daily, metoprolol 50 mg BID, imdur 60 mg daily. Hydralazine was discontinued and started on amlodipine for antianginal effects.   ESRD: Patient was restarted on amlodipine, resumed HD schedule on discharge.  Please make sure she is continuing HD and repeat labs  Hx of DVT/PE: -Is on warfarin, INR on admission was 1.6. She was continued on lovenox to be bridged to warfarin. Please make sure she had INR labs on Monday 09/20/18  2.  Labs / imaging needed at time of follow-up: CBC, BMP, INR  3.  Pending labs/ test needing follow-up: None  Follow-up Appointments: Follow-up [place], [place] on 09/20/2018.   Specialty:  General Practice Why:  PLEASE MAKE SURE TO GO TO YOUR PRIMARY CARE DOCTOR FOR AN INR CHECK ON MONDAY 09/20/18 Contact information: [place] [id number] [phone]        Belva Crome, MD. Schedule an appointment as soon as possible for a visit on 10/05/2018.   Specialty:  Cardiology Why:  Patient will need to call the [facility] doctor once discharged... Phone appointment for May 12th with the NP @ 9 AM Contact information: [address] [address] [address] by problem list:  1. Chest pain 2/2 stable angina:  2. Hx of ACS:  This is a 68 year old female with a history of CAD, ESRD on hemodialysis TTS, hypertension, OSA, bipolar disorder, hyperlipidemia, diabetes type 2 who presented with an episode of chest pain that occurred at the end of her dialysis session.  More located over the left chest with associated nausea and shortness of breath treated with aspirin and nitro which relieved the pain.  Obtained an EKG that showed some lateral T wave depressions, stable from prior EKG.  Troponins trended 0.05 > 0.05 >0.05.  Cardiology was consulted, recommended continuing medical management and switching hydralazine and starting amlodipine for antianginal effects.  Exact cause of her chest pain was unclear, appears to have stable angina and a small component of MSK. She was resumed on her other home medications hydralazine stopped and amlodipine 5 mg daily added.  Patient will follow-up with  cardiologist at [facility].   3. ESRD on HD TTS: 4. Hypokalemia: Patient had completed most of her dialysis session on the day of admission, missed the last 20 minutes.  Potassium was noted to be 3.2 and increased to 4.1 on day of discharge. We continued to monitor BMP and no urgent requirement for dialysis was noted.  She was resumed on her TTS hemodialysis schedule on discharge.  5. Hx of DVT/PE: Per family, patient was hospitalized at [name] 1 week prior to admission for a PE, and left lower extremity DVT.  Attempted to obtain records from [place] however have not received them yet.  Given her shortness of breath and chest pain we obtained a lower  extremity ultrasound that showed no evidence of DVT, and a d-dimer was negative.  She was noted to be subtherapeutic with an INR of 1.6, she was started on Lovenox in a.  She was recommended on discharge.  She should have follow-up with Coumadin clinic to repeat labs.   6. DM: She is on Levemir 10 units daily and sliding scale Humalog with meals.  No changes to her medications were made.   7. History of Anemia: Hemoglobin on admission was 1.2, MCV elevated to 102.  Per family and patient report when she was admitted to the hospital last week she was found to have a hemoglobin down to 5 and required 4 units of packed red blood cells, they are unable to find the cause of the bleeding.  We attempted to request [facility] records however have not received those records yet.. She remained hemodynamically stable and denied any signs of bleeding.  [facility] that they [facility] given.   Discharge Vitals:   BP (!) 142/68 (BP Location: Right Arm)    Pulse 68    Temp 98.4 F (36.9 C) (Oral)    Resp 16    Ht 5\' 4"  (1.626 m)    Wt 74.9 kg    SpO2 95%    BMI 28.36 kg/m   Pertinent Labs, Studies, and Procedures:  BMP Latest Ref Rng & Units 09/17/2018 09/16/2018 12/29/2017  Glucose 70 - 99 mg/dL 99 197(H) 114(H)  BUN 8 - 23 mg/dL 25(H) 10  24(H)  Creatinine 0.44 - 1.00 mg/dL 4.87(H) 3.01(H) 4.74(H)  Sodium 135 - 145 mmol/L 138 140 136  Potassium 3.5 - 5.1 mmol/L 4.1 3.2(L) 4.0  Chloride 98 - 111 mmol/L 96(L) 94(L) 96(L)  CO2 22 - 32 mmol/L 26 30 25   Calcium 8.9 - 10.3 mg/dL 9.5 9.2 9.1   CBC Latest Ref Rng & Units 09/17/2018 09/16/2018 12/27/2017  WBC 4.0 - 10.5 K/uL 6.7 7.1 7.1  Hemoglobin 12.0 - 15.0 g/dL 9.5(L) 11.2(L) 9.7(L)  Hematocrit 36.0 - 46.0 % 30.1(L) 35.8(L) 30.0(L)  Platelets 150 - 400 K/uL 196 203 194   Troponins: 0.05 > 0.05 > 0.05  Discharge Instructions: Discharge Instructions    Call MD for:  extreme fatigue   Complete by:  As directed    Call MD for:  persistant dizziness or light-headedness   Complete by:  As directed    Call MD for:  persistant nausea and vomiting   Complete by:  As directed    Diet - low sodium heart healthy   Complete by:  As directed    Discharge instructions   Complete by:  As directed    It was a pleasure to take care of you Robin [name]. During your hospitalization you were taken care of for chest pain. We believe your chest pain was due to stable angina (some mild blockage in your heart) and also due to musculoskeletal pain. Please stop taking ranolazine and hydralazine. Please start taking amlodipine 5mg  daily. Please start taking lovenox 80mg  daily for 7 days and warfarin 10mg  daily for 5 days. Please make sure you follow up with your primary care provider on Monday 09/20/18 to get an INR check. Thank you!   Increase activity slowly   Complete by:  As directed       Signed: Asencion Noble, MD 09/19/2018, 9:37 PM   Pager: [phone]

## 2018-09-17 NOTE — Progress Notes (Signed)
Progress Note  [name] Date of Encounter: 09/17/2018  Primary Cardiologist: Sinclair Grooms, MD   Subjective   No CP this AM; mild dyspnea; no sleep  Inpatient Medications    Scheduled Meds:  amLODipine  5 mg Oral Daily   ARIPiprazole  7.5 mg Oral Daily   aspirin  81 mg Oral Daily   atorvastatin  20 mg Oral QHS   diclofenac sodium  2 g Topical QID   gabapentin  100 mg Oral QHS   gabapentin  200 mg Oral q morning - 10a   insulin aspart  0-15 Units Subcutaneous TID WC   insulin detemir  15 Units Subcutaneous Daily   isosorbide mononitrate  60 mg Oral Daily   metoprolol tartrate  50 mg Oral BID   pantoprazole  20 mg Oral Daily   pneumococcal 23 valent vaccine  0.5 mL Intramuscular Tomorrow-1000   Continuous Infusions:  heparin 1,300 Units/hr (09/17/18 0307)   PRN Meds: acetaminophen **OR** acetaminophen, Melatonin, nitroGLYCERIN, promethazine, senna-docusate   Vital Signs    Vitals:   09/16/18 1656 09/16/18 1939 09/16/18 2353 09/17/18 0425  BP: (!) 162/89 (!) 149/68 (!) 161/94 134/69  Pulse: 76 69 71 64  Resp:  18 20 18   Temp:  98.9 F (37.2 C) 99.2 F (37.3 C) 98.1 F (36.7 C)  TempSrc:  Oral Oral Oral  SpO2:  96% 100% 95%  Weight:    74.9 kg  Height:        Intake/Output Summary (Last 24 hours) at 09/17/2018 0827 Last data filed at 09/17/2018 5956 Gross per 24 hour  Intake 554.26 ml  Output 1 ml  Net 553.26 ml   Last 3 Weights 09/17/2018 09/16/2018 01/27/2018  Weight (lbs) 165 lb 3.2 oz 155 lb 160 lb  Weight (kg) 74.934 kg 70.308 kg 72.576 kg      Telemetry    NSR- Personally Reviewed  Physical Exam   GEN: No acute distress.   Neck: No JVD Cardiac: RRR, no murmurs, rubs, or gallops.  Respiratory: Clear to auscultation bilaterally. GI: Soft, nontender, non-distended  MS: No edema Neuro:  Nonfocal  Psych: Normal affect   Labs    Chemistry Recent Labs  Lab 09/16/18 1130 09/17/18 0328  NA 140 138  K 3.2*  4.1  CL 94* 96*  CO2 30 26  GLUCOSE 197* 99  BUN 10 25*  CREATININE 3.01* 4.87*  CALCIUM 9.2 9.5  GFRNONAA 15* 9*  GFRAA 18* 10*  ANIONGAP 16* 16*     Hematology Recent Labs  Lab 09/16/18 1130 09/17/18 0328  WBC 7.1 6.7  RBC 3.48* 2.94*  HGB 11.2* 9.5*  HCT 35.8* 30.1*  MCV 102.9* 102.4*  MCH 32.2 32.3  MCHC 31.3 31.6  RDW 19.9* 19.8*  PLT 203 196    Cardiac Enzymes Recent Labs  Lab 09/16/18 1130 09/16/18 1602 09/16/18 2030 09/17/18 0328  TROPONINI 0.05* 0.05* 0.05* 0.05*    DDimer  Recent Labs  Lab 09/16/18 1130  DDIMER <0.27     Radiology    Dg Chest 2 View  Result Date: 09/16/2018 CLINICAL DATA:  Chest pain and shortness of breath during dialysis. EXAM: CHEST - 2 VIEW COMPARISON:  12/26/2017 FINDINGS: Stable enlargement of the cardiac silhouette. Interstitial lung markings are mildly enlarged. Trachea is midline. Negative for a pneumothorax. No pleural effusions. Atherosclerotic calcifications in the aorta. Old fracture of the left sixth rib. IMPRESSION: 1. Stable mild cardiomegaly. 2. Slightly increased interstitial lung markings. Findings  could represent mild interstitial edema. No focal lung disease. Electronically Signed   By: Markus Daft M.D.   On: 09/16/2018 15:04   Vas [place] Lower Extremity Venous (dvt) (only Mc & Wl 7a-7p)  Result Date: 09/16/2018  Lower Venous Study Indications: Swelling.  Performing Technologist: Abram Sander RVS  Examination Guidelines: A complete evaluation includes B-mode imaging, spectral Doppler, color Doppler, [name] Bilateral testing is considered an integral part of a complete examination. Limited examinations for reoccurring indications may be performed as noted.  +-----+---------------+---------+-----------+----------+-------+  RIGHT Compressibility Phasicity Spontaneity Properties Summary  +-----+---------------+---------+-----------+----------+-------+  CFV   Full             Yes       Yes                             +-----+---------------+---------+-----------+----------+-------+   +---------+---------------+---------+-----------+----------+--------------+  LEFT      Compressibility Phasicity Spontaneity Properties Summary         +---------+---------------+---------+-----------+----------+--------------+  CFV       Full            Yes       Yes                                    +---------+---------------+---------+-----------+----------+--------------+  SFJ       Full                                                             +---------+---------------+---------+-----------+----------+--------------+  FV Prox   Full                                                             +---------+---------------+---------+-----------+----------+--------------+  FV Mid    Full                                                             +---------+---------------+---------+-----------+----------+--------------+  FV Distal Full                                                             +---------+---------------+---------+-----------+----------+--------------+  PFV       Full                                                             +---------+---------------+---------+-----------+----------+--------------+  POP       Full  Yes       Yes                                    +---------+---------------+---------+-----------+----------+--------------+  PTV       Full                                                             +---------+---------------+---------+-----------+----------+--------------+  PERO                                                       Not visualized  +---------+---------------+---------+-----------+----------+--------------+     Summary: Right: No evidence of common femoral vein obstruction. Left: There is no evidence of deep vein thrombosis in the lower extremity. However, portions of this examination were limited- see technologist comments above. No  cystic structure found in the popliteal fossa.  *See table(s) above for measurements and observations. Electronically signed by Servando Snare MD on 09/16/2018 at 5:10:21 PM.    Final     Cardiac Studies   Cath 12/28/2017  Prox RCA lesion is 20% stenosed.  Ost Ramus to Ramus lesion is 40% stenosed.  Ost Cx to Prox Cx lesion is 65% stenosed.  Prox Cx to Mid Cx lesion is 20% stenosed.  Ost LAD to Mid LAD lesion is 25% stenosed.  Ost 1st Diag lesion is 50% stenosed.  Ost 2nd Diag lesion is 70% stenosed.  Mid RCA to Dist RCA lesion is 30% stenosed.  The left ventricular systolic function is normal.  LV end diastolic pressure is normal.  The left ventricular ejection fraction is 45-50% by visual estimate.  There is no mitral valve regurgitation.   1. Mild non-obstructive disease in the proximal and mid LAD 2. Moderate, heavily calcified proximal Circumflex stenosis. This appears to be an eccentric lesion and is not felt to be flow limiting. The moderate caliber intermediate branch arises from this lesion. The intermediate branch has a proximal stent that is patent with mild to moderate stent restenosis 3. The RCA is a moderate caliber co-dominant vessel with mild disease in the mid and distal segments.  4. Inferior wall hypokinesis with mild LV systolic dysfunction.   Recommendations: She has no obstructive disease in the LAD or RCA. Her Circumflex is heavily calcified proximally with moderate eccentric stenosis involving the moderate caliber intermediate branch. It is unclear how far back the intermediate stent extends. I would recommend medical management for now regarding the ostial/proximal stenosis. This lesion would be difficult to approach with PCI given the heavy calcification and the involvement of the intermediate branch which has a stent that extends back to the Circumflex. If she has recurrent angina, would consider addition of Ranexa.    Patient Profile     68 y.o.  female with a hx of  CAD, ESRD on HD TThS, HTN, OSA, bipolar affective disorder, DVT, HLD and DM II who is being seen today for the evaluation of chest pain at the request of Dr. [place].  Assessment & Plan    1 chest pain-as outlined in yesterday's consult note  etiology of chest pain is not clear to me.  Symptoms appear to be chronic and there was some reproduction of chest pain with palpation.  Her enzymes are flat and not diagnostic of acute coronary syndrome.  She had a relatively recent catheterization with results as outlined.  Would treat medically.  Continue aspirin, statin, beta-blocker and nitrates.  Amlodipine also added.    2 end-stage renal disease-dialysis per nephrology.  3 hypertension-hydralazine has been discontinued and amlodipine added both for blood pressure and as antianginal.  4 hyperlipidemia-continue statin.  5 coronary artery disease-continue aspirin and statin.  [facility] will sign off.   Medication Recommendations: Continue present medications as listed in MAR. Other recommendations (labs, testing, etc): No further cardiac testing. Follow up as an outpatient: Follow-up telehealth visit 1 to 2 weeks with APP.  Follow-up Dr. Tamala Julian 12 weeks.  For questions or updates, please contact [place] Please consult [email or web address] for contact info under        Signed, Almyra Deforest, [place]  09/17/2018, 8:27 AM

## 2018-09-17 NOTE — Discharge Instructions (Signed)

## 2018-09-17 NOTE — Progress Notes (Signed)
ANTICOAGULATION CONSULT NOTE   Pharmacy Consult for Heparin Indication: DVT  No Known Allergies  Patient Measurements: Height: 5\' 4"  (162.6 cm) Weight: 155 lb (70.3 kg) IBW/kg (Calculated) : 54.7 Heparin dosing wt: 70kg  Vital Signs: Temp: 99.2 F (37.3 C) (04/23 2353) Temp Source: Oral (04/23 2353) BP: 161/94 (04/23 2353) Pulse Rate: 71 (04/23 2353)  Labs: Recent Labs    09/16/18 1130 09/16/18 1602 09/16/18 2030 09/17/18 0030  HGB 11.2*  --   --   --   HCT 35.8*  --   --   --   PLT 203  --   --   --   LABPROT 18.4*  --   --   --   INR 1.6*  --   --   --   HEPARINUNFRC  --   --   --  <0.10*  CREATININE 3.01*  --   --   --   TROPONINI 0.05* 0.05* 0.05*  --     Estimated Creatinine Clearance: 17.7 mL/min (A) (by C-G formula based on SCr of 3.01 mg/dL (H)).   Medical History: Past Medical History:  Diagnosis Date  . Acute delirium 11/18/2014  . Acute encephalopathy   . Acute on chronic respiratory failure with hypoxia ([place]) 09/09/2016  . Acute respiratory failure with hypoxia ([place]) 11/29/2015  . Anemia in chronic kidney disease 12/09/2014  . Anxiety   . Arthritis   . Benign hypertension   . Bipolar affective disorder ([place]) 12/09/2014  . CAD (coronary artery disease), native coronary artery with 2 stents  11/17/2014  . Charcot foot due to diabetes mellitus ([place])   . Chronic combined systolic and diastolic CHF (congestive heart failure) ([place]) 11/18/2014  . Closed left ankle fracture 11/17/2014  . Confusion 01/21/2015  . Depression   . Diabetes mellitus without complication ([place])   . End stage renal disease on dialysis ([place])   . Fracture dislocation of ankle 11/17/2014  . GERD (gastroesophageal reflux disease)   . Heart murmur   . History of blood transfusion   . History of bronchitis   . History of pneumonia   . Hyperlipidemia 03/26/2015  . Hypertensive heart/renal disease with failure ([place]) 12/09/2014  . Obesity 11/17/2014  . Onychomycosis 10/07/2016  . Right leg  DVT ([place]) 12/05/2015  . SIRS (systemic inflammatory response syndrome) ([place]) 08/08/2016  . Sleep apnea   . Ventricular tachycardia ([place])     Assessment: 42 [name] with DVT in last week, hx unclear as to current tx for DVT, report of lovenox + warfarin outpt (INR 1.6 on admission here) [facility], will start heparin here and IM to re-consult for warfarin when tx plan clear.  Chronic anemia of ESRD stable, plts 203.    4/24 AM update: heparin level undetectable, no issues per RN.  Goal of Therapy:  Heparin level 0.3-0.7 units/ml Monitor platelets by anticoagulation protocol: Yes   Plan:  Inc heparin to 1300 units/hr Re-check heparin level in 8 hours  Narda Bonds, PharmD, [place] Pharmacist Phone: [phone]

## 2018-09-17 NOTE — TOC Transition Note (Signed)
Transition of Care [name]) - CM/SW Discharge Note   Patient Details  Name: HALI BALGOBIN MRN: [id number] Date of Birth: [birth year]  Transition of Care [name]) CM/SW Contact:  Sherrilyn Rist Phone Number: [phone] 09/17/2018, 1:59 PM   Clinical Narrative:    Patient admitted under Observational status; goes to the [facility] for medical care, she gets her medication there also; back up pharmacy is [facility] on [address]. No needs identified at this time.   Final next level of care: Home/Self Care Barriers to Discharge: No Barriers Identified   Patient Goals and CMS Choice Patient states their goals for this hospitalization and ongoing recovery are:: no more chest pain CMS Medicare.gov Compare Post Acute Care list provided to:: Patient Choice offered to / list presented to : NA  Discharge Placement                       Discharge Plan and Services In-house Referral: NA Discharge Planning Services: CM Consult Post Acute Care Choice: NA          DME Arranged: N/A DME Agency: NA         HH Agency: NA        Social Determinants of Health (SDOH) Interventions     Readmission Risk Interventions No flowsheet data found.

## 2018-09-17 NOTE — Progress Notes (Signed)
   Subjective: She reports SOB and headache this morning. She denies chest pain, fevers, chills, congestion, and cough. She reports that she is feel better today and denies any other complaints. We discussed the results of the tests and that everything has come back negative thus far. We discussed that she may be able to go home today and she reported that she would like to. All questions and concerns were answered.   Objective:  Vital signs in last 24 hours: Vitals:   09/16/18 1656 09/16/18 1939 09/16/18 2353 09/17/18 0425  BP: (!) 162/89 (!) 149/68 (!) 161/94 134/69  Pulse: 76 69 71 64  Resp:  18 20 18   Temp:  98.9 F (37.2 C) 99.2 F (37.3 C) 98.1 F (36.7 C)  TempSrc:  Oral Oral Oral  SpO2:  96% 100% 95%  Weight:    74.9 kg  Height:        General: Lying in bed in no acute distress Cardiac: Normal rate, regular rhtyhm Pulmonary:  CTABL, no wheezing or rhonchi  Abdomen:  Soft, non-tender, non-distended Extremity: Fistula in left arm patent, no warmth, tenderness, or erythema Psychiatry: Normal affect, mood, and behavior  Assessment/Plan:  Active Problems:   Chest pain  This is a 68 year old female with history of CAD, ESRD on HD TTS, HTN, OSA, bipolar disorder, HLD, DM type 2 who presented during developing episode of chest pain during dialysis having intermittent short, located more over the left chest area with radiation down right arm, and associated with some nausea and shortness of breath. Admitted for chest pain rule out  Chest pain: Hx of ACS: Patient reports that she has been chest pain free, she reports that she is still having occasional shortness of breath. Vitals have been stable. EKG was unremarkable and troponins have trended from 0.05 > 0.05 > 0.05. She denies any other issues. This may have had a small component of stable angina and MSK, etiology is not clear, she is on medical therapy for ACS. She had not received any Voltaren gel so we will not continue on  discharge.   -Cardiology was following, they recommended stopping hydralazine and continuing amlodipine for anti-anginal effects, recommended f/u with them in 1-2 weeks and with Dr. Tamala Julian in 12 weeks. -Continue ASA -Continue lipitor  -Metoprolol tartrate 50 mg BID -Imdur 60 mg daily -Amlodipine 5 mg daily -D/c telemetry -Stable for d/c today, will follow up with PCP and cardiologist at [facility], [place].  Hypokalemia: ESRD on HD TTS -Labs today is unremarkable, she completed most of her dialysis on Thursday, completed all except the last 20 minutes, she is to go to dialysis tomorrow and resume her normal schedule.  -Resume HD schedule on discharge  Hx of DVT/PE: -On warfarin at home, was subtherapeutic on admission with INR of 1.6. She has been taking her medications at home. No evidence of PE or DVT at this time.  -Continue lovenox on discharge to bridge to warfarin -Continue warfarin on discharge -F/u with INR clinic on Monday  Diabetes mellitus: Patient is on Levemir 10 units daily, and 3 to 15 units Humalog with meals.  Glucose was elevated to 197 on admission.  -Continue SSI, levemir 115 units nightly -Continue Gabapentin -Resume home medications on discharge  FEN: No fluids, replete lytes prn, renal diet VTE ppx: Lovenox Code Status: FULL    Dispo: Anticipated discharge in approximately today   Asencion Noble, MD 09/17/2018, 6:49 AM Pager: [phone]

## 2018-09-17 NOTE — Care Management Obs Status (Signed)
[facility] NOTIFICATION   Patient Details  Name: Robin [name] MRN: [id number] Date of Birth: [birth year]   Medicare Observation Status Notification Given:  Yes    Royston Bake, RN 09/17/2018, 1:57 PM

## 2018-09-22 DIAGNOSIS — N186 End stage renal disease: Secondary | ICD-10-CM | POA: Diagnosis not present

## 2018-09-22 DIAGNOSIS — E1122 Type 2 diabetes mellitus with diabetic chronic kidney disease: Secondary | ICD-10-CM | POA: Diagnosis not present

## 2018-09-22 DIAGNOSIS — I2724 Chronic thromboembolic pulmonary hypertension: Secondary | ICD-10-CM | POA: Diagnosis not present

## 2018-09-22 DIAGNOSIS — I132 Hypertensive heart and chronic kidney disease with heart failure and with stage 5 chronic kidney disease, or end stage renal disease: Secondary | ICD-10-CM | POA: Diagnosis not present

## 2018-09-22 DIAGNOSIS — I2510 Atherosclerotic heart disease of native coronary artery without angina pectoris: Secondary | ICD-10-CM | POA: Diagnosis not present

## 2018-09-22 DIAGNOSIS — I5042 Chronic combined systolic (congestive) and diastolic (congestive) heart failure: Secondary | ICD-10-CM | POA: Diagnosis not present

## 2018-09-24 DIAGNOSIS — I132 Hypertensive heart and chronic kidney disease with heart failure and with stage 5 chronic kidney disease, or end stage renal disease: Secondary | ICD-10-CM | POA: Diagnosis not present

## 2018-09-24 DIAGNOSIS — E1122 Type 2 diabetes mellitus with diabetic chronic kidney disease: Secondary | ICD-10-CM | POA: Diagnosis not present

## 2018-09-24 DIAGNOSIS — N186 End stage renal disease: Secondary | ICD-10-CM | POA: Diagnosis not present

## 2018-09-24 DIAGNOSIS — I2724 Chronic thromboembolic pulmonary hypertension: Secondary | ICD-10-CM | POA: Diagnosis not present

## 2018-09-24 DIAGNOSIS — I5042 Chronic combined systolic (congestive) and diastolic (congestive) heart failure: Secondary | ICD-10-CM | POA: Diagnosis not present

## 2018-09-24 DIAGNOSIS — I2510 Atherosclerotic heart disease of native coronary artery without angina pectoris: Secondary | ICD-10-CM | POA: Diagnosis not present

## 2018-09-29 DIAGNOSIS — I5042 Chronic combined systolic (congestive) and diastolic (congestive) heart failure: Secondary | ICD-10-CM | POA: Diagnosis not present

## 2018-09-29 DIAGNOSIS — I2510 Atherosclerotic heart disease of native coronary artery without angina pectoris: Secondary | ICD-10-CM | POA: Diagnosis not present

## 2018-09-29 DIAGNOSIS — N186 End stage renal disease: Secondary | ICD-10-CM | POA: Diagnosis not present

## 2018-09-29 DIAGNOSIS — I132 Hypertensive heart and chronic kidney disease with heart failure and with stage 5 chronic kidney disease, or end stage renal disease: Secondary | ICD-10-CM | POA: Diagnosis not present

## 2018-09-29 DIAGNOSIS — E1122 Type 2 diabetes mellitus with diabetic chronic kidney disease: Secondary | ICD-10-CM | POA: Diagnosis not present

## 2018-09-29 DIAGNOSIS — I2724 Chronic thromboembolic pulmonary hypertension: Secondary | ICD-10-CM | POA: Diagnosis not present

## 2018-10-01 DIAGNOSIS — I2510 Atherosclerotic heart disease of native coronary artery without angina pectoris: Secondary | ICD-10-CM | POA: Diagnosis not present

## 2018-10-01 DIAGNOSIS — E1122 Type 2 diabetes mellitus with diabetic chronic kidney disease: Secondary | ICD-10-CM | POA: Diagnosis not present

## 2018-10-01 DIAGNOSIS — I5042 Chronic combined systolic (congestive) and diastolic (congestive) heart failure: Secondary | ICD-10-CM | POA: Diagnosis not present

## 2018-10-01 DIAGNOSIS — I132 Hypertensive heart and chronic kidney disease with heart failure and with stage 5 chronic kidney disease, or end stage renal disease: Secondary | ICD-10-CM | POA: Diagnosis not present

## 2018-10-01 DIAGNOSIS — I2724 Chronic thromboembolic pulmonary hypertension: Secondary | ICD-10-CM | POA: Diagnosis not present

## 2018-10-01 DIAGNOSIS — N186 End stage renal disease: Secondary | ICD-10-CM | POA: Diagnosis not present

## 2018-10-08 DIAGNOSIS — I2510 Atherosclerotic heart disease of native coronary artery without angina pectoris: Secondary | ICD-10-CM | POA: Diagnosis not present

## 2018-10-08 DIAGNOSIS — I5042 Chronic combined systolic (congestive) and diastolic (congestive) heart failure: Secondary | ICD-10-CM | POA: Diagnosis not present

## 2018-10-08 DIAGNOSIS — N186 End stage renal disease: Secondary | ICD-10-CM | POA: Diagnosis not present

## 2018-10-08 DIAGNOSIS — I2724 Chronic thromboembolic pulmonary hypertension: Secondary | ICD-10-CM | POA: Diagnosis not present

## 2018-10-08 DIAGNOSIS — E1122 Type 2 diabetes mellitus with diabetic chronic kidney disease: Secondary | ICD-10-CM | POA: Diagnosis not present

## 2018-10-08 DIAGNOSIS — I132 Hypertensive heart and chronic kidney disease with heart failure and with stage 5 chronic kidney disease, or end stage renal disease: Secondary | ICD-10-CM | POA: Diagnosis not present

## 2018-10-15 DIAGNOSIS — E785 Hyperlipidemia, unspecified: Secondary | ICD-10-CM | POA: Diagnosis not present

## 2018-10-15 DIAGNOSIS — Z9181 History of falling: Secondary | ICD-10-CM | POA: Diagnosis not present

## 2018-10-15 DIAGNOSIS — I5042 Chronic combined systolic (congestive) and diastolic (congestive) heart failure: Secondary | ICD-10-CM | POA: Diagnosis not present

## 2018-10-15 DIAGNOSIS — F319 Bipolar disorder, unspecified: Secondary | ICD-10-CM | POA: Diagnosis not present

## 2018-10-15 DIAGNOSIS — I82412 Acute embolism and thrombosis of left femoral vein: Secondary | ICD-10-CM | POA: Diagnosis not present

## 2018-10-15 DIAGNOSIS — F259 Schizoaffective disorder, unspecified: Secondary | ICD-10-CM | POA: Diagnosis not present

## 2018-10-15 DIAGNOSIS — E1122 Type 2 diabetes mellitus with diabetic chronic kidney disease: Secondary | ICD-10-CM | POA: Diagnosis not present

## 2018-10-15 DIAGNOSIS — F419 Anxiety disorder, unspecified: Secondary | ICD-10-CM | POA: Diagnosis not present

## 2018-10-15 DIAGNOSIS — N186 End stage renal disease: Secondary | ICD-10-CM | POA: Diagnosis not present

## 2018-10-15 DIAGNOSIS — I2510 Atherosclerotic heart disease of native coronary artery without angina pectoris: Secondary | ICD-10-CM | POA: Diagnosis not present

## 2018-10-15 DIAGNOSIS — E669 Obesity, unspecified: Secondary | ICD-10-CM | POA: Diagnosis not present

## 2018-10-15 DIAGNOSIS — I132 Hypertensive heart and chronic kidney disease with heart failure and with stage 5 chronic kidney disease, or end stage renal disease: Secondary | ICD-10-CM | POA: Diagnosis not present

## 2018-10-15 DIAGNOSIS — Z8701 Personal history of pneumonia (recurrent): Secondary | ICD-10-CM | POA: Diagnosis not present

## 2018-10-15 DIAGNOSIS — Z992 Dependence on renal dialysis: Secondary | ICD-10-CM | POA: Diagnosis not present

## 2018-10-15 DIAGNOSIS — Z7982 Long term (current) use of aspirin: Secondary | ICD-10-CM | POA: Diagnosis not present

## 2018-10-15 DIAGNOSIS — Z7901 Long term (current) use of anticoagulants: Secondary | ICD-10-CM | POA: Diagnosis not present

## 2018-10-15 DIAGNOSIS — K219 Gastro-esophageal reflux disease without esophagitis: Secondary | ICD-10-CM | POA: Diagnosis not present

## 2018-10-15 DIAGNOSIS — M1990 Unspecified osteoarthritis, unspecified site: Secondary | ICD-10-CM | POA: Diagnosis not present

## 2018-10-15 DIAGNOSIS — F17210 Nicotine dependence, cigarettes, uncomplicated: Secondary | ICD-10-CM | POA: Diagnosis not present

## 2018-10-15 DIAGNOSIS — D631 Anemia in chronic kidney disease: Secondary | ICD-10-CM | POA: Diagnosis not present

## 2018-10-15 DIAGNOSIS — I2724 Chronic thromboembolic pulmonary hypertension: Secondary | ICD-10-CM | POA: Diagnosis not present

## 2018-10-15 DIAGNOSIS — Z951 Presence of aortocoronary bypass graft: Secondary | ICD-10-CM | POA: Diagnosis not present

## 2018-10-15 DIAGNOSIS — G4733 Obstructive sleep apnea (adult) (pediatric): Secondary | ICD-10-CM | POA: Diagnosis not present

## 2018-10-27 DIAGNOSIS — I5042 Chronic combined systolic (congestive) and diastolic (congestive) heart failure: Secondary | ICD-10-CM | POA: Diagnosis not present

## 2018-10-27 DIAGNOSIS — E1122 Type 2 diabetes mellitus with diabetic chronic kidney disease: Secondary | ICD-10-CM | POA: Diagnosis not present

## 2018-10-27 DIAGNOSIS — N186 End stage renal disease: Secondary | ICD-10-CM | POA: Diagnosis not present

## 2018-10-27 DIAGNOSIS — I2510 Atherosclerotic heart disease of native coronary artery without angina pectoris: Secondary | ICD-10-CM | POA: Diagnosis not present

## 2018-10-27 DIAGNOSIS — I132 Hypertensive heart and chronic kidney disease with heart failure and with stage 5 chronic kidney disease, or end stage renal disease: Secondary | ICD-10-CM | POA: Diagnosis not present

## 2018-10-27 DIAGNOSIS — I2724 Chronic thromboembolic pulmonary hypertension: Secondary | ICD-10-CM | POA: Diagnosis not present

## 2018-11-03 DIAGNOSIS — E1122 Type 2 diabetes mellitus with diabetic chronic kidney disease: Secondary | ICD-10-CM | POA: Diagnosis not present

## 2018-11-03 DIAGNOSIS — I132 Hypertensive heart and chronic kidney disease with heart failure and with stage 5 chronic kidney disease, or end stage renal disease: Secondary | ICD-10-CM | POA: Diagnosis not present

## 2018-11-03 DIAGNOSIS — I2510 Atherosclerotic heart disease of native coronary artery without angina pectoris: Secondary | ICD-10-CM | POA: Diagnosis not present

## 2018-11-03 DIAGNOSIS — I5042 Chronic combined systolic (congestive) and diastolic (congestive) heart failure: Secondary | ICD-10-CM | POA: Diagnosis not present

## 2018-11-03 DIAGNOSIS — N186 End stage renal disease: Secondary | ICD-10-CM | POA: Diagnosis not present

## 2018-11-03 DIAGNOSIS — I2724 Chronic thromboembolic pulmonary hypertension: Secondary | ICD-10-CM | POA: Diagnosis not present

## 2018-11-12 DIAGNOSIS — I132 Hypertensive heart and chronic kidney disease with heart failure and with stage 5 chronic kidney disease, or end stage renal disease: Secondary | ICD-10-CM | POA: Diagnosis not present

## 2018-11-12 DIAGNOSIS — E1122 Type 2 diabetes mellitus with diabetic chronic kidney disease: Secondary | ICD-10-CM | POA: Diagnosis not present

## 2018-11-12 DIAGNOSIS — N186 End stage renal disease: Secondary | ICD-10-CM | POA: Diagnosis not present

## 2018-11-12 DIAGNOSIS — I5042 Chronic combined systolic (congestive) and diastolic (congestive) heart failure: Secondary | ICD-10-CM | POA: Diagnosis not present

## 2018-11-12 DIAGNOSIS — I2510 Atherosclerotic heart disease of native coronary artery without angina pectoris: Secondary | ICD-10-CM | POA: Diagnosis not present

## 2018-11-12 DIAGNOSIS — I2724 Chronic thromboembolic pulmonary hypertension: Secondary | ICD-10-CM | POA: Diagnosis not present

## 2018-12-24 ENCOUNTER — Other Ambulatory Visit: Payer: Self-pay

## 2019-02-22 ENCOUNTER — Other Ambulatory Visit: Payer: Self-pay

## 2019-02-22 ENCOUNTER — Ambulatory Visit (INDEPENDENT_AMBULATORY_CARE_PROVIDER_SITE_OTHER): Payer: Medicare Other | Admitting: Family

## 2019-02-22 ENCOUNTER — Encounter: Payer: Self-pay | Admitting: Family

## 2019-02-22 DIAGNOSIS — B351 Tinea unguium: Secondary | ICD-10-CM

## 2019-02-22 NOTE — Progress Notes (Signed)
Office Visit Note   Patient: Robin [name]           Date of Birth: [birth year]           MRN: [id number] Visit Date: 02/22/2019              Requested by: Center, [facility] [address] [place],  [place] [address] PCP: [facility]  No chief complaint on file.     HPI: The patient is a 68 year old woman who presents today in follow up for bilateral foot evaluation. She requests a nail trim. States the [place] has cut down on appointments and she had trouble getting in with them to have her nails trimmed there.  Assessment & Plan: Visit Diagnoses:  No diagnosis found.  Plan: Nails trimmed 10. Recommended compression stockings and elevation as well. She'll follow-up in office in 3 months, sooner should she have any concerns in the meantime.  Follow-Up Instructions: Return in about 3 months (around 05/24/2019).   Ortho Exam  Patient is alert, oriented, no adenopathy, well-dressed, normal affect, normal respiratory effort. Well-healed surgical scars to the right foot.  Trace edema bilateral feet and ankles. Thickened and discolored onychomycotic nails 10. She is unable to safely trim her own nails. Nails 10 without incident.  Imaging: No results found. No images are attached to the encounter.  Labs: Lab Results  Component Value Date   HGBA1C 6.7 (H) 09/09/2016   HGBA1C 7.7 08/25/2016   HGBA1C 8.3 (H) 07/30/2016   LABURIC 5.4 12/01/2009   LABURIC 5.5 11/30/2009   REPTSTATUS 08/13/2016 FINAL 08/07/2016   GRAMSTAIN  03/14/2016    MODERATE WBC PRESENT,BOTH PMN AND MONONUCLEAR NO ORGANISMS SEEN    CULT  08/07/2016    NO GROWTH 5 DAYS Performed at [facility], [place] [address] [place], [place] [address]     Orders:  No orders of the defined types were placed in this encounter.  No orders of the defined types were placed in this encounter.    Procedures: No procedures performed  Clinical Data: No additional findings.  ROS:  All other systems  negative, except as noted in the HPI. Review of Systems  Constitutional: Negative for chills and fever.  Skin: Negative for color change and wound.  Neurological: Negative for weakness.    Objective: Vital Signs: There were no vitals taken for this visit.  Specialty Comments:  No specialty comments available.  PMFS History: Patient Active Problem List   Diagnosis Date Noted  . ESRD on hemodialysis ([place])   . Chest pain 09/16/2018  . Unstable angina ([place]) 12/26/2017  . Dyslipidemia   . End stage renal disease on dialysis ([place])   . Onychomycosis 10/07/2016  . Diabetic Charct's arthropathy ([place]) 10/07/2016  . Acute on chronic respiratory failure with hypoxia ([place]) 09/09/2016  . GERD (gastroesophageal reflux disease) 09/09/2016  . Depression 09/09/2016  . Acute renal failure superimposed on stage 4 chronic kidney disease ([place]) 09/09/2016  . Chronic diastolic (congestive) heart failure ([place]) 09/09/2016  . Elevated troponin 09/09/2016  . Abdominal pain 09/09/2016  . SIRS (systemic inflammatory response syndrome) ([place]) 08/08/2016  . CHF (congestive heart failure) ([place]) 08/07/2016  . Right leg DVT ([place]) 12/05/2015  . Charcot foot due to diabetes mellitus ([place])   . Hyperlipidemia 03/26/2015  . Charcot ankle 03/16/2015  . Confusion 01/21/2015  . Anemia 01/21/2015  . Multiple falls 01/21/2015  . Anemia, chronic renal failure   . Ventricular tachycardia ([place])   .  Chronic renal disease, stage 4, severely decreased glomerular filtration rate between 15-29 mL/min/1.73 square meter (HCC)   . Anemia in chronic kidney disease 12/09/2014  . CKD (chronic kidney disease) 12/09/2014  . Hypertensive heart/renal disease with failure ([place]) 12/09/2014  . Bipolar affective disorder ([place]) 12/09/2014  . Acute encephalopathy   . Acute delirium 11/18/2014  . Type 2 diabetes mellitus with hyperglycemia ([place]) 11/18/2014  . Essential hypertension 11/18/2014  . Chronic combined systolic and diastolic  CHF (congestive heart failure) ([place]) 11/18/2014  . Fracture dislocation of ankle 11/17/2014  . Diabetes mellitus type 2, insulin dependent ([place]) 11/17/2014  . Hypokalemia 11/17/2014  . Benign essential hypertension with delivery 11/17/2014  . Obesity 11/17/2014  . CAD (coronary artery disease), native coronary artery with 2 stents  11/17/2014   Past Medical History:  Diagnosis Date  . Acute delirium 11/18/2014  . Acute encephalopathy   . Acute on chronic respiratory failure with hypoxia ([place]) 09/09/2016  . Acute respiratory failure with hypoxia ([place]) 11/29/2015  . Anemia in chronic kidney disease 12/09/2014  . Anxiety   . Arthritis   . Benign hypertension   . Bipolar affective disorder ([place]) 12/09/2014  . CAD (coronary artery disease), native coronary artery with 2 stents  11/17/2014  . Charcot foot due to diabetes mellitus ([place])   . Chronic combined systolic and diastolic CHF (congestive heart failure) ([place]) 11/18/2014  . Closed left ankle fracture 11/17/2014  . Confusion 01/21/2015  . Depression   . Diabetes mellitus without complication ([place])   . End stage renal disease on dialysis ([place])   . Fracture dislocation of ankle 11/17/2014  . GERD (gastroesophageal reflux disease)   . Heart murmur   . History of blood transfusion   . History of bronchitis   . History of pneumonia   . Hyperlipidemia 03/26/2015  . Hypertensive heart/renal disease with failure ([place]) 12/09/2014  . Obesity 11/17/2014  . Onychomycosis 10/07/2016  . Right leg DVT ([place]) 12/05/2015  . SIRS (systemic inflammatory response syndrome) ([place]) 08/08/2016  . Sleep apnea   . Ventricular tachycardia ([place])     Family History  Family history unknown: Yes    Past Surgical History:  Procedure Laterality Date  . ABDOMINAL HYSTERECTOMY    . ANKLE CLOSED REDUCTION N/A 11/17/2014   Procedure: CLOSED REDUCTION ANKLE;  Surgeon: Earlie Server, MD;  Location: [facility];  Service: Orthopedics;  Laterality: N/A;  . ANKLE FUSION Left  03/16/2015   Procedure: Left Tibiocalcaneal Fusion;  Surgeon: Newt Minion, MD;  Location: [facility];  Service: Orthopedics;  Laterality: Left;  . APPLICATION OF WOUND VAC Right 08/06/2016   Procedure: APPLICATION OF PREVENA WOUND VAC;  Surgeon: Newt Minion, MD;  Location: [facility];  Service: Orthopedics;  Laterality: Right;  . AV FISTULA PLACEMENT Left [id number]   done at [facility]     2 stent   . CHOLECYSTECTOMY    . CORONARY STENT PLACEMENT    . FOOT ARTHRODESIS Right 08/06/2016   Procedure: Right Foot Fusion Lisfranc Joint;  Surgeon: Newt Minion, MD;  Location: [facility];  Service: Orthopedics;  Laterality: Right;  . HARDWARE REMOVAL Left 03/16/2015   Procedure: Removal Hardware Left Ankle;  Surgeon: Newt Minion, MD;  Location: [facility];  Service: Orthopedics;  Laterality: Left;  . IR AV DIALY SHUNT INTRO NEEDLE/INTRACATH INITIAL W/PTA/IMG LEFT  01/05/2018  . IR US GUIDE VASC ACCESS LEFT  01/05/2018  . LEFT HEART CATH AND CORONARY ANGIOGRAPHY N/A 12/28/2017  Procedure: LEFT HEART CATH AND CORONARY ANGIOGRAPHY;  Surgeon: Burnell Blanks, MD;  Location: [facility];  Service: Cardiovascular;  Laterality: N/A;  . ORIF ANKLE FRACTURE Left 11/20/2014   Procedure: OPEN REDUCTION INTERNAL FIXATION (ORIF) ANKLE FRACTURE;  Surgeon: Renette Butters, MD;  Location: [facility];  Service: Orthopedics;  Laterality: Left;  . TONSILLECTOMY     Social History   Occupational History  . Not on file  Tobacco Use  . Smoking status: Current Every Day Smoker    Packs/day: 1.00    Types: Cigarettes  . Smokeless tobacco: Never Used  Substance and Sexual Activity  . Alcohol use: No  . Drug use: No  . Sexual activity: Never

## 2019-04-14 ENCOUNTER — Emergency Department (HOSPITAL_COMMUNITY): Payer: Medicare Other

## 2019-04-14 ENCOUNTER — Other Ambulatory Visit: Payer: Self-pay

## 2019-04-14 ENCOUNTER — Inpatient Hospital Stay (HOSPITAL_COMMUNITY)
Admission: EM | Admit: 2019-04-14 | Discharge: 2019-04-17 | DRG: 302 | Disposition: A | Discharge status: Home Health | Payer: Medicare Other | Attending: Family Medicine | Admitting: Family Medicine

## 2019-04-14 DIAGNOSIS — I440 Atrioventricular block, first degree: Secondary | ICD-10-CM | POA: Diagnosis present

## 2019-04-14 DIAGNOSIS — E669 Obesity, unspecified: Secondary | ICD-10-CM | POA: Diagnosis present

## 2019-04-14 DIAGNOSIS — R079 Chest pain, unspecified: Secondary | ICD-10-CM | POA: Diagnosis present

## 2019-04-14 DIAGNOSIS — Z8701 Personal history of pneumonia (recurrent): Secondary | ICD-10-CM

## 2019-04-14 DIAGNOSIS — I120 Hypertensive chronic kidney disease with stage 5 chronic kidney disease or end stage renal disease: Secondary | ICD-10-CM | POA: Diagnosis not present

## 2019-04-14 DIAGNOSIS — R0602 Shortness of breath: Secondary | ICD-10-CM | POA: Diagnosis not present

## 2019-04-14 DIAGNOSIS — I4729 Other ventricular tachycardia: Secondary | ICD-10-CM

## 2019-04-14 DIAGNOSIS — Z20828 Contact with and (suspected) exposure to other viral communicable diseases: Secondary | ICD-10-CM | POA: Diagnosis present

## 2019-04-14 DIAGNOSIS — I25110 Atherosclerotic heart disease of native coronary artery with unstable angina pectoris: Secondary | ICD-10-CM | POA: Diagnosis not present

## 2019-04-14 DIAGNOSIS — E1169 Type 2 diabetes mellitus with other specified complication: Secondary | ICD-10-CM | POA: Diagnosis present

## 2019-04-14 DIAGNOSIS — R011 Cardiac murmur, unspecified: Secondary | ICD-10-CM | POA: Diagnosis present

## 2019-04-14 DIAGNOSIS — I272 Pulmonary hypertension, unspecified: Secondary | ICD-10-CM | POA: Diagnosis present

## 2019-04-14 DIAGNOSIS — E1159 Type 2 diabetes mellitus with other circulatory complications: Secondary | ICD-10-CM | POA: Diagnosis not present

## 2019-04-14 DIAGNOSIS — R55 Syncope and collapse: Secondary | ICD-10-CM | POA: Diagnosis present

## 2019-04-14 DIAGNOSIS — I10 Essential (primary) hypertension: Secondary | ICD-10-CM | POA: Diagnosis not present

## 2019-04-14 DIAGNOSIS — E1140 Type 2 diabetes mellitus with diabetic neuropathy, unspecified: Secondary | ICD-10-CM | POA: Diagnosis present

## 2019-04-14 DIAGNOSIS — I132 Hypertensive heart and chronic kidney disease with heart failure and with stage 5 chronic kidney disease, or end stage renal disease: Secondary | ICD-10-CM | POA: Diagnosis present

## 2019-04-14 DIAGNOSIS — R0789 Other chest pain: Secondary | ICD-10-CM | POA: Diagnosis not present

## 2019-04-14 DIAGNOSIS — D539 Nutritional anemia, unspecified: Secondary | ICD-10-CM | POA: Diagnosis present

## 2019-04-14 DIAGNOSIS — Z6828 Body mass index (BMI) 28.0-28.9, adult: Secondary | ICD-10-CM

## 2019-04-14 DIAGNOSIS — F319 Bipolar disorder, unspecified: Secondary | ICD-10-CM | POA: Diagnosis present

## 2019-04-14 DIAGNOSIS — I472 Ventricular tachycardia: Secondary | ICD-10-CM | POA: Diagnosis present

## 2019-04-14 DIAGNOSIS — I152 Hypertension secondary to endocrine disorders: Secondary | ICD-10-CM | POA: Diagnosis present

## 2019-04-14 DIAGNOSIS — J9611 Chronic respiratory failure with hypoxia: Secondary | ICD-10-CM | POA: Diagnosis present

## 2019-04-14 DIAGNOSIS — D631 Anemia in chronic kidney disease: Secondary | ICD-10-CM | POA: Diagnosis present

## 2019-04-14 DIAGNOSIS — I200 Unstable angina: Secondary | ICD-10-CM | POA: Diagnosis not present

## 2019-04-14 DIAGNOSIS — Z79899 Other long term (current) drug therapy: Secondary | ICD-10-CM

## 2019-04-14 DIAGNOSIS — E785 Hyperlipidemia, unspecified: Secondary | ICD-10-CM | POA: Diagnosis present

## 2019-04-14 DIAGNOSIS — Z955 Presence of coronary angioplasty implant and graft: Secondary | ICD-10-CM

## 2019-04-14 DIAGNOSIS — N189 Chronic kidney disease, unspecified: Secondary | ICD-10-CM | POA: Diagnosis present

## 2019-04-14 DIAGNOSIS — R4020 Unspecified coma: Secondary | ICD-10-CM | POA: Diagnosis not present

## 2019-04-14 DIAGNOSIS — G4733 Obstructive sleep apnea (adult) (pediatric): Secondary | ICD-10-CM | POA: Diagnosis present

## 2019-04-14 DIAGNOSIS — N186 End stage renal disease: Secondary | ICD-10-CM | POA: Diagnosis present

## 2019-04-14 DIAGNOSIS — I5042 Chronic combined systolic (congestive) and diastolic (congestive) heart failure: Secondary | ICD-10-CM | POA: Diagnosis present

## 2019-04-14 DIAGNOSIS — Z992 Dependence on renal dialysis: Secondary | ICD-10-CM

## 2019-04-14 DIAGNOSIS — R778 Other specified abnormalities of plasma proteins: Secondary | ICD-10-CM | POA: Diagnosis present

## 2019-04-14 DIAGNOSIS — R42 Dizziness and giddiness: Secondary | ICD-10-CM | POA: Diagnosis not present

## 2019-04-14 DIAGNOSIS — Z86711 Personal history of pulmonary embolism: Secondary | ICD-10-CM

## 2019-04-14 DIAGNOSIS — I2510 Atherosclerotic heart disease of native coronary artery without angina pectoris: Secondary | ICD-10-CM | POA: Diagnosis present

## 2019-04-14 DIAGNOSIS — I25118 Atherosclerotic heart disease of native coronary artery with other forms of angina pectoris: Secondary | ICD-10-CM | POA: Diagnosis not present

## 2019-04-14 DIAGNOSIS — R001 Bradycardia, unspecified: Secondary | ICD-10-CM | POA: Diagnosis present

## 2019-04-14 DIAGNOSIS — E1122 Type 2 diabetes mellitus with diabetic chronic kidney disease: Secondary | ICD-10-CM | POA: Diagnosis present

## 2019-04-14 DIAGNOSIS — Z90710 Acquired absence of both cervix and uterus: Secondary | ICD-10-CM

## 2019-04-14 DIAGNOSIS — E11610 Type 2 diabetes mellitus with diabetic neuropathic arthropathy: Secondary | ICD-10-CM | POA: Diagnosis present

## 2019-04-14 DIAGNOSIS — M1990 Unspecified osteoarthritis, unspecified site: Secondary | ICD-10-CM | POA: Diagnosis present

## 2019-04-14 DIAGNOSIS — K219 Gastro-esophageal reflux disease without esophagitis: Secondary | ICD-10-CM | POA: Diagnosis present

## 2019-04-14 DIAGNOSIS — E119 Type 2 diabetes mellitus without complications: Secondary | ICD-10-CM

## 2019-04-14 DIAGNOSIS — Z86718 Personal history of other venous thrombosis and embolism: Secondary | ICD-10-CM

## 2019-04-14 DIAGNOSIS — E8889 Other specified metabolic disorders: Secondary | ICD-10-CM | POA: Diagnosis present

## 2019-04-14 DIAGNOSIS — F419 Anxiety disorder, unspecified: Secondary | ICD-10-CM | POA: Diagnosis present

## 2019-04-14 DIAGNOSIS — Z794 Long term (current) use of insulin: Secondary | ICD-10-CM | POA: Diagnosis not present

## 2019-04-14 DIAGNOSIS — Z981 Arthrodesis status: Secondary | ICD-10-CM

## 2019-04-14 DIAGNOSIS — N2581 Secondary hyperparathyroidism of renal origin: Secondary | ICD-10-CM | POA: Diagnosis present

## 2019-04-14 DIAGNOSIS — F17210 Nicotine dependence, cigarettes, uncomplicated: Secondary | ICD-10-CM | POA: Diagnosis present

## 2019-04-14 DIAGNOSIS — Z7901 Long term (current) use of anticoagulants: Secondary | ICD-10-CM

## 2019-04-14 LAB — CBC WITH DIFFERENTIAL/PLATELET
Abs Immature Granulocytes: 0.02 10*3/uL (ref 0.00–0.07)
Basophils Absolute: 0.1 10*3/uL (ref 0.0–0.1)
Basophils Relative: 1 %
Eosinophils Absolute: 0.1 10*3/uL (ref 0.0–0.5)
Eosinophils Relative: 1 %
HCT: 37.1 % (ref 36.0–46.0)
Hemoglobin: 12.2 g/dL (ref 12.0–15.0)
Immature Granulocytes: 0 %
Lymphocytes Relative: 17 %
Lymphs Abs: 1.2 10*3/uL (ref 0.7–4.0)
MCH: 33.4 pg (ref 26.0–34.0)
MCHC: 32.9 g/dL (ref 30.0–36.0)
MCV: 101.6 fL — ABNORMAL HIGH (ref 80.0–100.0)
Monocytes Absolute: 0.6 10*3/uL (ref 0.1–1.0)
Monocytes Relative: 9 %
Neutro Abs: 5.1 10*3/uL (ref 1.7–7.7)
Neutrophils Relative %: 72 %
Platelets: 175 10*3/uL (ref 150–400)
RBC: 3.65 MIL/uL — ABNORMAL LOW (ref 3.87–5.11)
RDW: 18.5 % — ABNORMAL HIGH (ref 11.5–15.5)
WBC: 7 10*3/uL (ref 4.0–10.5)
nRBC: 0 % (ref 0.0–0.2)

## 2019-04-14 LAB — I-STAT CHEM 8, ED
BUN: 38 mg/dL — ABNORMAL HIGH (ref 8–23)
BUN: 41 mg/dL — ABNORMAL HIGH (ref 8–23)
Calcium, Ion: 0.88 mmol/L — CL (ref 1.15–1.40)
Calcium, Ion: 0.93 mmol/L — ABNORMAL LOW (ref 1.15–1.40)
Chloride: 96 mmol/L — ABNORMAL LOW (ref 98–111)
Chloride: 97 mmol/L — ABNORMAL LOW (ref 98–111)
Creatinine, Ser: 5 mg/dL — ABNORMAL HIGH (ref 0.44–1.00)
Creatinine, Ser: 5.2 mg/dL — ABNORMAL HIGH (ref 0.44–1.00)
Glucose, Bld: 150 mg/dL — ABNORMAL HIGH (ref 70–99)
Glucose, Bld: 151 mg/dL — ABNORMAL HIGH (ref 70–99)
HCT: 39 % (ref 36.0–46.0)
HCT: 40 % (ref 36.0–46.0)
Hemoglobin: 13.3 g/dL (ref 12.0–15.0)
Hemoglobin: 13.6 g/dL (ref 12.0–15.0)
Potassium: 4.3 mmol/L (ref 3.5–5.1)
Potassium: 5.1 mmol/L (ref 3.5–5.1)
Sodium: 135 mmol/L (ref 135–145)
Sodium: 137 mmol/L (ref 135–145)
TCO2: 33 mmol/L — ABNORMAL HIGH (ref 22–32)
TCO2: 34 mmol/L — ABNORMAL HIGH (ref 22–32)

## 2019-04-14 LAB — BASIC METABOLIC PANEL
Anion gap: 16 — ABNORMAL HIGH (ref 5–15)
BUN: 24 mg/dL — ABNORMAL HIGH (ref 8–23)
CO2: 29 mmol/L (ref 22–32)
Calcium: 8.9 mg/dL (ref 8.9–10.3)
Chloride: 94 mmol/L — ABNORMAL LOW (ref 98–111)
Creatinine, Ser: 4.88 mg/dL — ABNORMAL HIGH (ref 0.44–1.00)
GFR calc Af Amer: 10 mL/min — ABNORMAL LOW (ref >60)
GFR calc non Af Amer: 9 mL/min — ABNORMAL LOW (ref >60)
Glucose, Bld: 168 mg/dL — ABNORMAL HIGH (ref 70–99)
Potassium: 4.4 mmol/L (ref 3.5–5.1)
Sodium: 139 mmol/L (ref 135–145)

## 2019-04-14 LAB — GLUCOSE, CAPILLARY
Glucose-Capillary: 142 mg/dL — ABNORMAL HIGH (ref 70–99)
Glucose-Capillary: 363 mg/dL — ABNORMAL HIGH (ref 70–99)

## 2019-04-14 LAB — SARS CORONAVIRUS 2 (TAT 6-24 HRS): SARS Coronavirus 2: NEGATIVE

## 2019-04-14 LAB — MAGNESIUM: Magnesium: 2.1 mg/dL (ref 1.7–2.4)

## 2019-04-14 LAB — TROPONIN I (HIGH SENSITIVITY)
Troponin I (High Sensitivity): 32 ng/L — ABNORMAL HIGH (ref <18)
Troponin I (High Sensitivity): 32 ng/L — ABNORMAL HIGH (ref <18)

## 2019-04-14 LAB — HIV ANTIBODY (ROUTINE TESTING W REFLEX): HIV Screen 4th Generation wRfx: NONREACTIVE

## 2019-04-14 MED ORDER — ONDANSETRON HCL 4 MG/2ML IJ SOLN
4.0000 mg | Freq: Four times a day (QID) | INTRAMUSCULAR | Status: DC | PRN
  Administered 2019-04-14: 16:00:00 4 mg via INTRAVENOUS
  Filled 2019-04-14: qty 2

## 2019-04-14 MED ORDER — HYDROMORPHONE HCL 1 MG/ML IJ SOLN
0.5000 mg | Freq: Once | INTRAMUSCULAR | Status: AC
  Administered 2019-04-14: 0.5 mg via INTRAVENOUS
  Filled 2019-04-14: qty 1

## 2019-04-14 MED ORDER — ASPIRIN EC 81 MG PO TBEC
81.0000 mg | DELAYED_RELEASE_TABLET | Freq: Every day | ORAL | Status: DC
  Administered 2019-04-15 – 2019-04-17 (×3): 81 mg via ORAL
  Filled 2019-04-14 (×3): qty 1

## 2019-04-14 MED ORDER — ACETAMINOPHEN 325 MG PO TABS
650.0000 mg | ORAL_TABLET | ORAL | Status: DC | PRN
  Administered 2019-04-15 – 2019-04-16 (×2): 650 mg via ORAL
  Filled 2019-04-14 (×2): qty 2

## 2019-04-14 MED ORDER — AMLODIPINE BESYLATE 5 MG PO TABS
5.0000 mg | ORAL_TABLET | Freq: Every day | ORAL | Status: DC
  Administered 2019-04-14 – 2019-04-17 (×4): 5 mg via ORAL
  Filled 2019-04-14 (×4): qty 1

## 2019-04-14 MED ORDER — ALBUTEROL SULFATE (2.5 MG/3ML) 0.083% IN NEBU: 2.5000 mg | INHALATION_SOLUTION | Freq: Four times a day (QID) | RESPIRATORY_TRACT | Status: DC | PRN

## 2019-04-14 MED ORDER — HYDROMORPHONE HCL 1 MG/ML IJ SOLN
0.5000 mg | Freq: Once | INTRAMUSCULAR | Status: AC
  Administered 2019-04-14: 13:00:00 0.5 mg via INTRAVENOUS
  Filled 2019-04-14: qty 1

## 2019-04-14 MED ORDER — ARIPIPRAZOLE 5 MG PO TABS
7.5000 mg | ORAL_TABLET | Freq: Every day | ORAL | Status: DC
  Administered 2019-04-15 – 2019-04-17 (×3): 7.5 mg via ORAL
  Filled 2019-04-14 (×4): qty 2

## 2019-04-14 MED ORDER — ASPIRIN 81 MG PO CHEW
324.0000 mg | CHEWABLE_TABLET | ORAL | Status: AC
  Filled 2019-04-14: qty 4

## 2019-04-14 MED ORDER — METOPROLOL TARTRATE 50 MG PO TABS
75.0000 mg | ORAL_TABLET | Freq: Two times a day (BID) | ORAL | Status: DC
  Administered 2019-04-14 – 2019-04-17 (×6): 75 mg via ORAL
  Filled 2019-04-14 (×6): qty 1

## 2019-04-14 MED ORDER — INSULIN DETEMIR 100 UNIT/ML ~~LOC~~ SOLN
10.0000 [IU] | Freq: Two times a day (BID) | SUBCUTANEOUS | Status: DC
  Administered 2019-04-14 – 2019-04-17 (×6): 10 [IU] via SUBCUTANEOUS
  Filled 2019-04-14 (×9): qty 0.1

## 2019-04-14 MED ORDER — NITROGLYCERIN 0.4 MG SL SUBL: 0.4000 mg | SUBLINGUAL_TABLET | SUBLINGUAL | Status: DC | PRN

## 2019-04-14 MED ORDER — AMIODARONE HCL IN DEXTROSE 360-4.14 MG/200ML-% IV SOLN
30.0000 mg/h | INTRAVENOUS | Status: DC
  Administered 2019-04-14 – 2019-04-15 (×2): 30 mg/h via INTRAVENOUS
  Filled 2019-04-14 (×3): qty 200

## 2019-04-14 MED ORDER — METOPROLOL TARTRATE 50 MG PO TABS
75.0000 mg | ORAL_TABLET | Freq: Two times a day (BID) | ORAL | Status: DC
  Administered 2019-04-14: 75 mg via ORAL
  Filled 2019-04-14: qty 3

## 2019-04-14 MED ORDER — APIXABAN 2.5 MG PO TABS
2.5000 mg | ORAL_TABLET | Freq: Two times a day (BID) | ORAL | Status: DC
  Administered 2019-04-14 – 2019-04-17 (×6): 2.5 mg via ORAL
  Filled 2019-04-14 (×6): qty 1

## 2019-04-14 MED ORDER — ISOSORBIDE MONONITRATE ER 60 MG PO TB24
60.0000 mg | ORAL_TABLET | Freq: Every day | ORAL | Status: DC
  Administered 2019-04-14 – 2019-04-17 (×4): 60 mg via ORAL
  Filled 2019-04-14 (×2): qty 2
  Filled 2019-04-14: qty 1
  Filled 2019-04-14: qty 2

## 2019-04-14 MED ORDER — CALCIUM CARBONATE ANTACID 500 MG PO CHEW
1.0000 | CHEWABLE_TABLET | Freq: Once | ORAL | Status: DC
  Filled 2019-04-14: qty 1

## 2019-04-14 MED ORDER — PANTOPRAZOLE SODIUM 20 MG PO TBEC
20.0000 mg | DELAYED_RELEASE_TABLET | Freq: Two times a day (BID) | ORAL | Status: DC
  Administered 2019-04-14 – 2019-04-17 (×6): 20 mg via ORAL
  Filled 2019-04-14 (×7): qty 1

## 2019-04-14 MED ORDER — ASPIRIN 300 MG RE SUPP: 300.0000 mg | RECTAL | Status: AC

## 2019-04-14 MED ORDER — ATORVASTATIN CALCIUM 10 MG PO TABS
20.0000 mg | ORAL_TABLET | Freq: Every day | ORAL | Status: DC
  Administered 2019-04-14 – 2019-04-16 (×3): 20 mg via ORAL
  Filled 2019-04-14 (×3): qty 2

## 2019-04-14 MED ORDER — AMIODARONE HCL IN DEXTROSE 360-4.14 MG/200ML-% IV SOLN
60.0000 mg/h | INTRAVENOUS | Status: AC
  Administered 2019-04-14: 60 mg/h via INTRAVENOUS

## 2019-04-14 NOTE — Consult Note (Addendum)
Cardiology Consultation:   [name]; [id number]; 07/09/1950   Admit date: 04/14/2019 Date of Consult: 04/14/2019  Primary Care Provider: [facility] [place] Primary Cardiologist: [place], [place] Cardiologist Primary Electrophysiologist:  None   Patient Profile:   Robin [name] is a 68 y.o. female with a hx of ESRD on HD TTS, OSA, CAD, HLD, DM, HTN, and bipolar disorder who is being seen today for the evaluation of chest pain and syncope at the request of Dr. Rex Kras.  History of Present Illness:   Robin [name] is a 68 year old female with the history noted above presenting with chet pain that started during her dialysis session, she was given nitroglycerin x3 and on arrival she was noted to have a brief syncopal episode. A cardiac monitor was placed and she was noted to have ventricular tachycardia. She reports that last night she started having some chest pressure before she was going to bed, she states that this morning she went to dialysis where she notice that her chest pressure and pain started worsening. She has been having episodes of palpitations for a few months now, she reports that it has been happening almost every night, associated with headaches, numbness in her hands and feet, and diaphoresis. She denies any fevers, chills, recent travel or sick contacts. She last saw her Cardiologist about 2 months ago, she denies any changes to her medications at that time. She is taking amlodipine, atorvastatin, imdur, metoprolol, and nitro PRN. She reports that she sometimes misses her meds however has not missed anything recently.   Her daughter reports that she was recently seen by the [facility] for her cardiac issues. She states that she had had a cardiac catheterization at that time that showed stable disease. She also had a echocardiogram that she was describing as possible diastolic dysfunction. She reports that her mother will get these episodes of chest pain every couple of  months, and that when she is at dialysis she will get sent over the the ED if she starts having chest pain.   She was admitted 04/23-04/24 for chest pain that occurred during her dialysis session, EKG at that time showed stable TWI in lateral leads. Troponins were flat. Seen by Dr. Stanford Breed and was recommended medical management and followed up with [place] cardiologist. She had a cardiac catheterization in 12/2017 that showed mild non-obstructive disease in the proximal and mid LAD, moderate, heavily calcified proximal circumflex stenosis (did nto appear flow limiting), RCA moderate caliber co-dominant vessel with mild disease, and inferior wall hypokinesis. Echocardiogram in 07/2016 showed mod LVH, EF 50-55%, mild MR, and G2DD.   In the ED she was noted to be afebrile, HR 60s, RR 12-16, BP 132/59-181/75. BMP showed Na 135, K 5.1, BUN 41, Cr 5, mag 2.1. CBC showed WBC 7, Hgb 12.2, Plt 175. Troponin 32. CXR showed cardiomegaly, no acute findings. Initially she was noted to have ventricular tachycardia and she was started on amiodarone drip.    Past Medical History:  Diagnosis Date   Acute delirium 11/18/2014   Acute encephalopathy    Acute on chronic respiratory failure with hypoxia (HCC) 09/09/2016   Acute respiratory failure with hypoxia (HCC) 11/29/2015   Anemia in chronic kidney disease 12/09/2014   Anxiety    Arthritis    Benign hypertension    Bipolar affective disorder ([place]) 12/09/2014   CAD (coronary artery disease), native coronary artery with 2 stents  11/17/2014   Charcot foot due to diabetes mellitus ([place])  Chronic combined systolic and diastolic CHF (congestive heart failure) ([place]) 11/18/2014   Closed left ankle fracture 11/17/2014   Confusion 01/21/2015   Depression    Diabetes mellitus without complication (HCC)    End stage renal disease on dialysis [facility])    Fracture dislocation of ankle 11/17/2014   GERD (gastroesophageal reflux disease)    Heart murmur    History  of blood transfusion    History of bronchitis    History of pneumonia    Hyperlipidemia 03/26/2015   Hypertensive heart/renal disease with failure ([place]) 12/09/2014   Obesity 11/17/2014   Onychomycosis 10/07/2016   Right leg DVT ([place]) 12/05/2015   SIRS (systemic inflammatory response syndrome) ([place]) 08/08/2016   Sleep apnea    Ventricular tachycardia ([place])     Past Surgical History:  Procedure Laterality Date   ABDOMINAL HYSTERECTOMY     ANKLE CLOSED REDUCTION N/A 11/17/2014   Procedure: CLOSED REDUCTION ANKLE;  Surgeon: Earlie Server, MD;  Location: [place];  Service: Orthopedics;  Laterality: N/A;   ANKLE FUSION Left 03/16/2015   Procedure: Left Tibiocalcaneal Fusion;  Surgeon: Newt Minion, MD;  Location: [place];  Service: Orthopedics;  Laterality: Left;   APPLICATION OF WOUND VAC Right 08/06/2016   Procedure: APPLICATION OF PREVENA WOUND VAC;  Surgeon: Newt Minion, MD;  Location: [place];  Service: Orthopedics;  Laterality: Right;   AV FISTULA PLACEMENT Left [id number]   done at [place]     2 stent    [place] Right 08/06/2016   Procedure: Right Foot Fusion Lisfranc Joint;  Surgeon: Newt Minion, MD;  Location: [place];  Service: Orthopedics;  Laterality: Right;   HARDWARE REMOVAL Left 03/16/2015   Procedure: Removal Hardware Left Ankle;  Surgeon: Newt Minion, MD;  Location: [place];  Service: Orthopedics;  Laterality: Left;   IR AV DIALY SHUNT INTRO NEEDLE/INTRACATH INITIAL W/PTA/IMG LEFT  01/05/2018   IR US GUIDE VASC ACCESS LEFT  01/05/2018   LEFT HEART CATH AND CORONARY ANGIOGRAPHY N/A 12/28/2017   Procedure: LEFT HEART CATH AND CORONARY ANGIOGRAPHY;  Surgeon: Burnell Blanks, MD;  Location: [place] [facility];  Service: Cardiovascular;  Laterality: N/A;   ORIF ANKLE FRACTURE Left 11/20/2014   Procedure: OPEN REDUCTION INTERNAL FIXATION (ORIF) ANKLE FRACTURE;  Surgeon:  Renette Butters, MD;  Location: [place];  Service: Orthopedics;  Laterality: Left;   TONSILLECTOMY       Home Medications:  Prior to Admission medications   Medication Sig Start Date End Date Taking? Authorizing Provider  acetaminophen (TYLENOL) 325 MG tablet Take 325-975 mg by mouth every 6 (six) hours as needed for mild pain or headache.    [provider]  albuterol (VENTOLIN HFA) 108 (90 Base) MCG/ACT inhaler Inhale 1-2 puffs into the lungs every 6 (six) hours as needed for wheezing or shortness of breath.    [provider]  amLODipine (NORVASC) 5 MG tablet Take 1 tablet (5 mg total) by mouth daily. 09/18/18   Chundi, Verne Spurr, MD  ammonium lactate (LAC-HYDRIN) 12 % lotion Apply 1 application topically 2 (two) times daily as needed (for itching/irritation).     [provider]  ARIPiprazole (ABILIFY) 15 MG tablet Take 7.5 mg by mouth daily.     [provider]  aspirin 81 MG chewable tablet Chew 1 tablet (81 mg total) by mouth daily. 12/30/17   Asencion Noble, MD  atorvastatin (LIPITOR) 40 MG tablet Take 20 mg by mouth at bedtime.     [provider]  dextrose (GLUTOSE) 40 % GEL Take 1 Tube by mouth once as needed (for blood sugar less than 60).     [provider]  enoxaparin (LOVENOX) 80 MG/0.8ML injection Inject 0.8 mLs (80 mg total) into the skin daily for 7 days. 09/18/18 09/25/18  Lars Mage, MD  gabapentin (NEURONTIN) 100 MG capsule Take 100-200 mg by mouth See admin instructions. Take 200mg   (two capsule) in the morning and 100mg  (1 capsules) at night    [provider]  insulin detemir (LEVEMIR) 100 UNIT/ML injection Inject 0.08 mLs (8 Units total) into the skin at bedtime. Patient taking differently: Inject 10 Units into the skin daily with lunch.  09/15/16   Oswald Hillock, MD  insulin lispro (HUMALOG) 100 UNIT/ML injection Inject 3-15 Units into the skin 3 (three) times daily before meals. Pt uses as needed per sliding  scale:    150-200:  3  units 201-250:  5 units 251-300:  8 units 301-350:  10 units 351-400:  12 units and call MD 400+ 15 units    [provider]  isosorbide mononitrate (IMDUR) 60 MG 24 hr tablet Take 60 mg by mouth daily.     [provider]  Melatonin 5 MG TABS Take 10 mg by mouth at bedtime.     [provider]  metoprolol tartrate (LOPRESSOR) 50 MG tablet Take 75 mg by mouth 2 (two) times daily.     [provider]  multivitamin (RENA-VIT) TABS tablet Take 1 tablet by mouth at bedtime.  12/11/17   [provider]  nitroGLYCERIN (NITROSTAT) 0.4 MG SL tablet Place 0.4 mg under the tongue every 5 (five) minutes as needed for chest pain.    [provider]  pantoprazole (PROTONIX) 20 MG tablet Take 40 mg by mouth 2 (two) times daily.     [provider]  warfarin (COUMADIN) 10 MG tablet Take 1 tablet (10 mg total) by mouth daily at 6 PM for 5 days. 09/17/18 09/22/18  Lars Mage, MD    Inpatient Medications: Scheduled Meds:  Continuous Infusions:  amiodarone 60 mg/hr (04/14/19 1130)   amiodarone     PRN Meds: Allergies:   No Known Allergies  Social History:   Social History   Socioeconomic History   Marital status: Divorced    Spouse name: Not on file   Number of children: Not on file   Years of education: Not on file   Highest education level: Not on file  Occupational History   Not on file  Social Needs   Financial resource strain: Not on file   Food insecurity    Worry: Not on file    Inability: Not on file   Transportation needs    Medical: Not on file    Non-medical: Not on file  Tobacco Use   Smoking status: Current Every Day Smoker    Packs/day: 1.00    Types: Cigarettes   Smokeless tobacco: Never Used  Substance and Sexual Activity   Alcohol use: No   Drug use: No   Sexual activity: Never  Lifestyle   Physical activity    Days per week: Not on file    Minutes per  session: Not on file   Stress: Not on file  Relationships   Social connections    Talks on phone: Not on file    Gets together: Not on  file    Attends religious service: Not on file    Active member of club or organization: Not on file    Attends meetings of clubs or organizations: Not on file    Relationship status: Not on file   Intimate partner violence    Fear of current or ex partner: Not on file    Emotionally abused: Not on file    Physically abused: Not on file    Forced sexual activity: Not on file  Other Topics Concern   Not on file  Social History Narrative   Not on file    Family History:    Family History  Family history unknown: Yes     ROS:  Please see the history of present illness.  ROS  All other ROS reviewed and negative.     Physical Exam/Data:   Vitals:   04/14/19 1200 04/14/19 1215 04/14/19 1230 04/14/19 1245  BP: (!) 163/62 (!) 132/59 (!) 159/74 (!) 139/38  Pulse: (!) 58 (!) 59 60 67  Resp: 16 13 13 15   Temp:      TempSrc:      SpO2: 100% 100% 100% 100%   No intake or output data in the 24 hours ending 04/14/19 1305 There were no vitals filed for this visit. There is no height or weight on file to calculate BMI.  General:  Middle aged female, NAD, laying in bed HEENT: normal Lymph: no adenopathy Neck: no JVD Endocrine:  No thryomegaly Vascular: No carotid bruits; FA pulses 2+ bilaterally without bruits  Cardiac:  normal S1, S2; RRR; no murmur  Lungs:  clear to auscultation bilaterally, no wheezing, rhonchi or rales  Abd: soft, nontender, no hepatomegaly  Ext: Trace left lower extremity edema, left upper extremity clamps over dialysis site Musculoskeletal:  No deformities, BUE and BLE strength normal and equal Skin: warm and dry  Neuro:  CNs 2-12 intact, no focal abnormalities noted Psych:  Normal affect   EKG:  The EKG was personally reviewed and demonstrates:  NSR, mild ST depression in lateral leads Telemetry:  Telemetry was  personally reviewed and demonstrates:  NSR, 9 beats of Vtach earlier today  Relevant CV Studies: Cath 12/28/2017  Prox RCA lesion is 20% stenosed.  Ost Ramus to Ramus lesion is 40% stenosed.  Ost Cx to Prox Cx lesion is 65% stenosed.  Prox Cx to Mid Cx lesion is 20% stenosed.  Ost LAD to Mid LAD lesion is 25% stenosed.  Ost 1st Diag lesion is 50% stenosed.  Ost 2nd Diag lesion is 70% stenosed.  Mid RCA to Dist RCA lesion is 30% stenosed.  The left ventricular systolic function is normal.  LV end diastolic pressure is normal.  The left ventricular ejection fraction is 45-50% by visual estimate.  There is no mitral valve regurgitation.  1. Mild non-obstructive disease in the proximal and mid LAD 2. Moderate, heavily calcified proximal Circumflex stenosis. This appears to be an eccentric lesion and is not felt to be flow limiting. The moderate caliber intermediate branch arises from this lesion. The intermediate branch has a proximal stent that is patent with mild to moderate stent restenosis 3. The RCA is a moderate caliber co-dominant vessel with mild disease in the mid and distal segments.  4. Inferior wall hypokinesis with mild LV systolic dysfunction.   Recommendations: She has no obstructive disease in the LAD or RCA. Her Circumflex is heavily calcified proximally with moderate eccentric stenosis involving the moderate caliber intermediate branch. It is unclear  how far back the intermediate stent extends. I would recommend medical management for now regarding the ostial/proximal stenosis. This lesion would be difficult to approach with PCI given the heavy calcification and the involvement of the intermediate branch which has a stent that extends back to the Circumflex. If she has recurrent angina, would consider addition of Ranexa.   Echocardiogram 08/09/2016: Study Conclusions  - Left ventricle: The cavity size was normal. Wall thickness was   increased in a pattern of  moderate LVH. Systolic function was   normal. The estimated ejection fraction was in the range of 50%   to 55%. Wall motion was normal; there were no regional wall   motion abnormalities. Features are consistent with a pseudonormal   left ventricular filling pattern, with concomitant abnormal   relaxation and increased filling pressure (grade 2 diastolic   dysfunction). - Mitral valve: There was mild regurgitation. - Left atrium: The atrium was mildly dilated. - Right atrium: The atrium was mildly dilated.  Laboratory Data:  Chemistry Recent Labs  Lab 04/14/19 1124 04/14/19 1130 04/14/19 1136  NA 139 137 135  K 4.4 4.3 5.1  CL 94* 96* 97*  CO2 29  --   --   GLUCOSE 168* 151* 150*  BUN 24* 38* 41*  CREATININE 4.88* 5.20* 5.00*  CALCIUM 8.9  --   --   GFRNONAA 9*  --   --   GFRAA 10*  --   --   ANIONGAP 16*  --   --     No results for input(s): PROT, ALBUMIN, AST, ALT, ALKPHOS, BILITOT in the last 168 hours. Hematology Recent Labs  Lab 04/14/19 1124 04/14/19 1130 04/14/19 1136  WBC 7.0  --   --   RBC 3.65*  --   --   HGB 12.2 13.3 13.6  HCT 37.1 39.0 40.0  MCV 101.6*  --   --   MCH 33.4  --   --   MCHC 32.9  --   --   RDW 18.5*  --   --   PLT 175  --   --    Cardiac EnzymesNo results for input(s): TROPONINI in the last 168 hours. No results for input(s): TROPIPOC in the last 168 hours.  BNPNo results for input(s): BNP, PROBNP in the last 168 hours.  DDimer No results for input(s): DDIMER in the last 168 hours.  Radiology/Studies:  Dg Chest Port 1 View  Result Date: 04/14/2019 CLINICAL DATA:  Chest pain during dialysis. EXAM: PORTABLE CHEST 1 VIEW COMPARISON:  09/16/2018 FINDINGS: Cardiomediastinal contours remain markedly enlarged. The pacer defibrillator pads and leads project over the chest on the left. No signs of consolidation or pleural effusion. No frank pulmonary edema. No acute bone finding. IMPRESSION: Cardiomegaly without acute cardiopulmonary disease.  Electronically Signed   By: Zetta Bills M.D.   On: 04/14/2019 12:04    Assessment and Plan:   1. Chest pain: Patient started having chest pain during her dialysis session today, improved with nitroglycerin. She has a history of CAD s/p stent, currently on medical management. Has a history of HTN, HLD, DM, and is a current smoker. Daughter reports that patient will get this about every few months, she recently had a work up within the last 3 months at the [place], including a cardiac catheterization and echocardiogram, no changes to her medications were made at that time. She also noted having worsening palpitations and chest pressure for a few months now, associated with headaches, diaphoresis and numbness.  On arrival to the ED she was found to have ventricular tachycardia, only 9 beats were noted on telemetry, unclear if it was present on EMS telemetry. She was started on amiodarone drip. She is currently in NSR, HR 80s. EKG showed NSR, mild ST depression in lateral leads. Troponin 32. Given the new onset ventricular tachycardia there is concern for ACS. Currently denies any chest pain. Will need to obtain [facility] records to see what was done there. May need repeat cath depending on records but will hold off for now.  [facility] -Continue amiodarone for now -Telemetry -Trend troponins -Resume home metoprolol, atorvastatin, Imdur, and ASA  2. HTN: Noted to have hypertension up to 181/75. Would recommend resuming home medications.  3. ESRD on HD TTS: Completed most of her session today, no emergent need for dialysis. Per primary  4. DM: Per primary  For questions or updates, please contact [place] Please consult [email or web address] for contact info under Cardiology/STEMI.   Signed, Asencion Noble, MD  04/14/2019 1:05 PM   I have examined the patient and reviewed assessment and plan and discussed with patient.  Agree with above as stated.    Daughter provides most of the  history.  SHe is frustrated with caer at [place], frustrated that we do not share records and do not communicate.  Based on the history, we were going to consdier cath, but then the daughter mentioned that cath and echo were done about 3 months ago.  No PCI was done.  There was moderate disease noted to treat medically.  In addition, the daughter was frustrated that once every few months, the mother complains of chest pain at dialysis and ends up at the ER.  She describes a life for the mother with poor functional capacity.  She goes to dialysis and the other time, she is sedentary at home.   Plan: 1) Will get records from the [place].  Would not plan for cath unless there is a dramatic change in her cardiac enzymes.  Flat troponin of 32 after 2 readings. 2) Nonsustained VT- Will see what EF is from echo.  Likely stop Amiodarone if EF is normal.  Would see if metoprolol can be increased.  3) Daughter describes that the patient had a PE and is now on ELiquis.    Chest pain is atypical and if records turn out to be as the daughter described, I think we will likely be pursuing medical therapy.   Larae Grooms

## 2019-04-14 NOTE — ED Notes (Signed)
I Stat Chem8 result for Ion Calcium of 0.88 reported to Dr. Rex Kras.

## 2019-04-14 NOTE — ED Provider Notes (Signed)
[facility] EMERGENCY DEPARTMENT Provider Note   CSN: [id number] Arrival date & time: 04/14/19  1107     History   Chief Complaint Chief Complaint  Patient presents with  . Chest Pain    HPI Robin [name] is a 68 y.o. female.     68 year old female with extensive past medical history including ESRD on HD, OSA, CAD, hypertension, bipolar disorder who presents with syncope and chest pain.  Patient went to routine dialysis today where she began having chest pain during the dialysis session.  EMS was called and patient received nitroglycerin x3 prior to their arrival.  When they arrived, the patient had a brief syncopal episode.  They placed her on heart monitoring and noted that she had intermittent runs of ventricular tachycardia.  Her blood pressure has remained stable during transport.  She received aspirin in route.  Her chest pain has improved but she reports a dull ache.  She has had associated shortness of breath.  She denies any cough, fever, vomiting, or recent illness.  Pain is similar to when she had an MI previously.  The history is provided by the patient and the EMS personnel.  Chest Pain   Past Medical History:  Diagnosis Date  . Acute delirium 11/18/2014  . Acute encephalopathy   . Acute on chronic respiratory failure with hypoxia ([place]) 09/09/2016  . Acute respiratory failure with hypoxia ([facility]) 11/29/2015  . Anemia in chronic kidney disease 12/09/2014  . Anxiety   . Arthritis   . Benign hypertension   . Bipolar affective disorder ([place]) 12/09/2014  . CAD (coronary artery disease), native coronary artery with 2 stents  11/17/2014  . Charcot foot due to diabetes mellitus ([place])   . Chronic combined systolic and diastolic CHF (congestive heart failure) ([place]) 11/18/2014  . Closed left ankle fracture 11/17/2014  . Confusion 01/21/2015  . Depression   . Diabetes mellitus without complication ([place])   . End stage renal disease on dialysis ([place])   . Fracture  dislocation of ankle 11/17/2014  . GERD (gastroesophageal reflux disease)   . Heart murmur   . History of blood transfusion   . History of bronchitis   . History of pneumonia   . Hyperlipidemia 03/26/2015  . Hypertensive heart/renal disease with failure ([place]) 12/09/2014  . Obesity 11/17/2014  . Onychomycosis 10/07/2016  . Right leg DVT ([place]) 12/05/2015  . SIRS (systemic inflammatory response syndrome) ([place]) 08/08/2016  . Sleep apnea   . Ventricular tachycardia [name])     Patient Active Problem List   Diagnosis Date Noted  . ESRD on hemodialysis ([place])   . Chest pain 09/16/2018  . Unstable angina ([place]) 12/26/2017  . Dyslipidemia   . End stage renal disease on dialysis ([place])   . Onychomycosis 10/07/2016  . Diabetic Charct's arthropathy ([place]) 10/07/2016  . Acute on chronic respiratory failure with hypoxia ([place]) 09/09/2016  . GERD (gastroesophageal reflux disease) 09/09/2016  . Depression 09/09/2016  . Acute renal failure superimposed on stage 4 chronic kidney disease ([place]) 09/09/2016  . Chronic diastolic (congestive) heart failure ([place]) 09/09/2016  . Elevated troponin 09/09/2016  . Abdominal pain 09/09/2016  . SIRS (systemic inflammatory response syndrome) ([place]) 08/08/2016  . CHF (congestive heart failure) ([place]) 08/07/2016  . Right leg DVT ([place]) 12/05/2015  . Charcot foot due to diabetes mellitus ([place])   . Hyperlipidemia 03/26/2015  . Charcot ankle 03/16/2015  . Confusion 01/21/2015  . Anemia 01/21/2015  . Multiple falls 01/21/2015  . Anemia, chronic renal  failure   . Ventricular tachycardia ([place])   . Chronic renal disease, stage 4, severely decreased glomerular filtration rate between 15-29 mL/min/1.73 square meter (HCC)   . Anemia in chronic kidney disease 12/09/2014  . CKD (chronic kidney disease) 12/09/2014  . Hypertensive heart/renal disease with failure ([place]) 12/09/2014  . Bipolar affective disorder ([place]) 12/09/2014  . Acute encephalopathy   . Acute delirium 11/18/2014   . Type 2 diabetes mellitus with hyperglycemia ([place]) 11/18/2014  . Essential hypertension 11/18/2014  . Chronic combined systolic and diastolic CHF (congestive heart failure) ([place]) 11/18/2014  . Fracture dislocation of ankle 11/17/2014  . Diabetes mellitus type 2, insulin dependent ([place]) 11/17/2014  . Hypokalemia 11/17/2014  . Benign essential hypertension with delivery 11/17/2014  . Obesity 11/17/2014  . CAD (coronary artery disease), native coronary artery with 2 stents  11/17/2014    Past Surgical History:  Procedure Laterality Date  . ABDOMINAL HYSTERECTOMY    . ANKLE CLOSED REDUCTION N/A 11/17/2014   Procedure: CLOSED REDUCTION ANKLE;  Surgeon: Earlie Server, MD;  Location: [facility];  Service: Orthopedics;  Laterality: N/A;  . ANKLE FUSION Left 03/16/2015   Procedure: Left Tibiocalcaneal Fusion;  Surgeon: Newt Minion, MD;  Location: [facility];  Service: Orthopedics;  Laterality: Left;  . APPLICATION OF WOUND VAC Right 08/06/2016   Procedure: APPLICATION OF PREVENA WOUND VAC;  Surgeon: Newt Minion, MD;  Location: [facility];  Service: Orthopedics;  Laterality: Right;  . AV FISTULA PLACEMENT Left [id number]   done at [facility]     2 stent   . CHOLECYSTECTOMY    . CORONARY STENT PLACEMENT    . FOOT ARTHRODESIS Right 08/06/2016   Procedure: Right Foot Fusion Lisfranc Joint;  Surgeon: Newt Minion, MD;  Location: [facility];  Service: Orthopedics;  Laterality: Right;  . HARDWARE REMOVAL Left 03/16/2015   Procedure: Removal Hardware Left Ankle;  Surgeon: Newt Minion, MD;  Location: [facility];  Service: Orthopedics;  Laterality: Left;  . IR AV DIALY SHUNT INTRO NEEDLE/INTRACATH INITIAL W/PTA/IMG LEFT  01/05/2018  . IR US GUIDE VASC ACCESS LEFT  01/05/2018  . LEFT HEART CATH AND CORONARY ANGIOGRAPHY N/A 12/28/2017   Procedure: LEFT HEART CATH AND CORONARY ANGIOGRAPHY;  Surgeon: Burnell Blanks, MD;  Location: [facility];  Service: Cardiovascular;  Laterality: N/A;   . ORIF ANKLE FRACTURE Left 11/20/2014   Procedure: OPEN REDUCTION INTERNAL FIXATION (ORIF) ANKLE FRACTURE;  Surgeon: Renette Butters, MD;  Location: [facility];  Service: Orthopedics;  Laterality: Left;  . TONSILLECTOMY       OB History   No obstetric history on file.      Home Medications    Prior to Admission medications   Medication Sig Start Date End Date Taking? Authorizing Provider  acetaminophen (TYLENOL) 325 MG tablet Take 325-975 mg by mouth every 6 (six) hours as needed for mild pain or headache.   Yes [provider]  albuterol (VENTOLIN HFA) 108 (90 Base) MCG/ACT inhaler Inhale 1-2 puffs into the lungs every 6 (six) hours as needed for wheezing or shortness of breath.   Yes [provider]  amLODipine (NORVASC) 5 MG tablet Take 1 tablet (5 mg total) by mouth daily. 09/18/18   Chundi, Verne Spurr, MD  ammonium lactate (LAC-HYDRIN) 12 % lotion Apply 1 application topically 2 (two) times daily as needed (for itching/irritation).     [provider]  ARIPiprazole (ABILIFY) 15 MG tablet Take 7.5 mg by mouth daily.  [provider]  aspirin 81 MG chewable tablet Chew 1 tablet (81 mg total) by mouth daily. 12/30/17   Asencion Noble, MD  atorvastatin (LIPITOR) 40 MG tablet Take 20 mg by mouth at bedtime.     [provider]  dextrose (GLUTOSE) 40 % GEL Take 1 Tube by mouth once as needed (for blood sugar less than 60).     [provider]  enoxaparin (LOVENOX) 80 MG/0.8ML injection Inject 0.8 mLs (80 mg total) into the skin daily for 7 days. 09/18/18 09/25/18  Lars Mage, MD  gabapentin (NEURONTIN) 100 MG capsule Take 100-200 mg by mouth See admin instructions. Take 200mg   (two capsule) in the morning and 100mg  (1 capsules) at night    [provider]  insulin detemir (LEVEMIR) 100 UNIT/ML injection Inject 0.08 mLs (8 Units total) into the skin at bedtime. Patient taking differently: Inject 10 Units into the skin daily with  lunch.  09/15/16   Oswald Hillock, MD  insulin lispro (HUMALOG) 100 UNIT/ML injection Inject 3-15 Units into the skin 3 (three) times daily before meals. Pt uses as needed per sliding scale:    150-200:  3  units 201-250:  5 units 251-300:  8 units 301-350:  10 units 351-400:  12 units and call MD 400+ 15 units    [provider]  isosorbide mononitrate (IMDUR) 60 MG 24 hr tablet Take 60 mg by mouth daily.     [provider]  Melatonin 5 MG TABS Take 10 mg by mouth at bedtime.     [provider]  metoprolol tartrate (LOPRESSOR) 50 MG tablet Take 75 mg by mouth 2 (two) times daily.     [provider]  multivitamin (RENA-VIT) TABS tablet Take 1 tablet by mouth at bedtime.  12/11/17   [provider]  nitroGLYCERIN (NITROSTAT) 0.4 MG SL tablet Place 0.4 mg under the tongue every 5 (five) minutes as needed for chest pain.    [provider]  pantoprazole (PROTONIX) 20 MG tablet Take 40 mg by mouth 2 (two) times daily.     [provider]  warfarin (COUMADIN) 10 MG tablet Take 1 tablet (10 mg total) by mouth daily at 6 PM for 5 days. 09/17/18 09/22/18  Lars Mage, MD    Family History Family History  Family history unknown: Yes    Social History Social History   Tobacco Use  . Smoking status: Current Every Day Smoker    Packs/day: 1.00    Types: Cigarettes  . Smokeless tobacco: Never Used  Substance Use Topics  . Alcohol use: No  . Drug use: No     Allergies   Patient has no known allergies.   Review of Systems Review of Systems  Cardiovascular: Positive for chest pain.   All other systems reviewed and are negative except that which was mentioned in HPI   Physical Exam Updated Vital Signs BP (!) 110/93   Pulse 60   Temp 97.8 F (36.6 C) (Oral)   Resp 13   SpO2 100%   Physical Exam Vitals signs and nursing note reviewed.  Constitutional:      General: She is not in acute distress.    Appearance:  She is well-developed.  HENT:     Head: Normocephalic and atraumatic.  Eyes:     Conjunctiva/sclera: Conjunctivae normal.  Neck:     Musculoskeletal: Neck supple.  Cardiovascular:     Rate and Rhythm: Normal rate and regular rhythm.  Heart sounds: Normal heart sounds. No murmur.  Pulmonary:     Effort: Pulmonary effort is normal.     Breath sounds: Normal breath sounds.  Abdominal:     General: Bowel sounds are normal. There is no distension.     Palpations: Abdomen is soft.     Tenderness: There is no abdominal tenderness.  Musculoskeletal:     Comments: Pressure dressing on LUE fistula, no active bleeding; trace BLE edema  Skin:    General: Skin is warm and dry.  Neurological:     Mental Status: She is alert and oriented to person, place, and time.     Comments: Fluent speech  Psychiatric:        Judgment: Judgment normal.      ED Treatments / Results  Labs (all labs ordered are listed, but only abnormal results are displayed) Labs Reviewed  BASIC METABOLIC PANEL - Abnormal; Notable for the following components:      Result Value   Chloride 94 (*)    Glucose, Bld 168 (*)    BUN 24 (*)    Creatinine, Ser 4.88 (*)    GFR calc non Af Amer 9 (*)    GFR calc Af Amer 10 (*)    Anion gap 16 (*)    All other components within normal limits  CBC WITH DIFFERENTIAL/PLATELET - Abnormal; Notable for the following components:   RBC 3.65 (*)    MCV 101.6 (*)    RDW 18.5 (*)    All other components within normal limits  I-STAT CHEM 8, ED - Abnormal; Notable for the following components:   Chloride 96 (*)    BUN 38 (*)    Creatinine, Ser 5.20 (*)    Glucose, Bld 151 (*)    Calcium, Ion 0.93 (*)    TCO2 33 (*)    All other components within normal limits  I-STAT CHEM 8, ED - Abnormal; Notable for the following components:   Chloride 97 (*)    BUN 41 (*)    Creatinine, Ser 5.00 (*)    Glucose, Bld 150 (*)    Calcium, Ion 0.88 (*)    TCO2 34 (*)    All other  components within normal limits  TROPONIN I (HIGH SENSITIVITY) - Abnormal; Notable for the following components:   Troponin I (High Sensitivity) 32 (*)    All other components within normal limits  SARS CORONAVIRUS 2 (TAT 6-24 HRS)  MAGNESIUM  TROPONIN I (HIGH SENSITIVITY)    EKG EKG Interpretation  Date/Time:  Thursday April 14 2019 11:12:42 EST Ventricular Rate:  63 PR Interval:    QRS Duration: 88 QT Interval:  482 QTC Calculation: 494 R Axis:   -20 Text Interpretation: Sinus rhythm Borderline left axis deviation Repol abnrm suggests ischemia, diffuse leads Interpretation limited secondary to artifact Confirmed by Theotis Burrow [phone]) on 04/14/2019 11:30:41 AM   Radiology Dg Chest Port 1 View  Result Date: 04/14/2019 CLINICAL DATA:  Chest pain during dialysis. EXAM: PORTABLE CHEST 1 VIEW COMPARISON:  09/16/2018 FINDINGS: Cardiomediastinal contours remain markedly enlarged. The pacer defibrillator pads and leads project over the chest on the left. No signs of consolidation or pleural effusion. No frank pulmonary edema. No acute bone finding. IMPRESSION: Cardiomegaly without acute cardiopulmonary disease. Electronically Signed   By: Zetta Bills M.D.   On: 04/14/2019 12:04    Procedures .Critical Care Performed by: Sharlett Iles, MD Authorized by: Sharlett Iles, MD   Critical care provider statement:  Critical care time (minutes):  35   Critical care time was exclusive of:  Separately billable procedures and treating other patients   Critical care was necessary to treat or prevent imminent or life-threatening deterioration of the following conditions:  Cardiac failure   Critical care was time spent personally by me on the following activities:  Development of treatment plan with patient or surrogate, discussions with consultants, evaluation of patient's response to treatment, examination of patient, obtaining history from patient or surrogate, review of  old charts, re-evaluation of patient's condition, ordering and review of radiographic studies, ordering and review of laboratory studies and ordering and performing treatments and interventions   (including critical care time)  Medications Ordered in ED Medications  amiodarone (NEXTERONE PREMIX) 360-4.14 MG/200ML-% (1.8 mg/mL) IV infusion (60 mg/hr Intravenous New Bag/Given 04/14/19 1130)  amiodarone (NEXTERONE PREMIX) 360-4.14 MG/200ML-% (1.8 mg/mL) IV infusion (has no administration in time range)  HYDROmorphone (DILAUDID) injection 0.5 mg (0.5 mg Intravenous Given 04/14/19 1249)     Initial Impression / Assessment and Plan / ED Course  I have reviewed the triage vital signs and the nursing notes.  Pertinent labs & imaging results that were available during my care of the patient were reviewed by me and considered in my medical decision making (see chart for details).       On arrival, patient was alert and nontoxic.  On EMS monitor, she had several runs of ventricular tachycardia but eventually switched to sinus rhythm when we switched her over to ED stretcher.  She remained in sinus rhythm and initial EKG shows no ST elevation.  Concern for ACS given history.  Started amiodarone infusion given her multiple episodes of ventricular tachycardia.  Patient placed on pacer pads.  Lab work shows normal potassium, high-sensitivity troponin 32 likely partially due to her ESRD.  Chest x-ray negative acute.  Contacted cardiology for evaluation given her runs of ventricular tachycardia and extensive cardiac history.  They will see the patient in consultation.  She has remained stable on amiodarone infusion with heart rate in the 60s, sinus rhythm.  I discussed admission with internal medicine teaching service, Dr. Kris Mouton, and patient admitted for further care.  Final Clinical Impressions(s) / ED Diagnoses   Final diagnoses:  Unstable angina (HCC)  Ventricular tachycardia, nonsustained ([place])   Syncope, unspecified syncope type    ED Discharge Orders    None       Suhan Paci, Wenda Overland, MD 04/14/19 1420

## 2019-04-14 NOTE — H&P (Addendum)
[facility] Admission History and Physical Service Pager: [phone]  [name] Medical record number: [id number] Date of birth: 1950/12/15 Age: 68 y.o. Gender: female  Primary Care Provider: [facility] [place] Consultants: Cardiology, Nephrology Code Status: Full Preferred Emergency Contact: lance galas, daughter, [phone]  Chief Complaint: Chest pain  Assessment and Plan: Robin [name] is a 68 y.o. female presenting with chest pain, syncopal episode, V. tach. PMH is significant for ESRD TTS, CAD (cath in August 2019 with diffuse stenosis), HTN, bipolar affective disorder, DVT, DM, HLD.  VTach  Syncope  chest pain Ms. Swofford is a 68 year old woman who began having chest pain during her dialysis session today.  She received nitroglycerin x3 by EMS and was noted to have a brief syncopal episode.  Subsequently she was placed on a cardiac monitor which revealed V. tach.  Patient reports that she initially began to feel bad last night with some chest pressure, today at dialysis she noticed that the pressure was worsening.  She reports nightly palpitations associated with headaches, numbness in her hands and feet and sweating.  Patient previously admitted in April of this year for a similar presentation, and cardiology at that time recommended medical management and follow-up with her [place] cardiologist.  She had a cardiac catheterization in August 2019 that showed mild nonobstructive disease in the proximal and mid LAD, moderate, heavily calcified proximal circumflex stenosis, RCA moderate caliber codominant vessel with mild disease, and inferior wall hypokinesis.  Last echocardiogram in 08/12/2016 showed moderate LVH, EF 50 to 55%, mild MR, and G2DD.  In the ED patient was noted to be afebrile and VSS.  Labs were unrevealing: Sodium 135, K5.1, bun 41, creatinine 5, mag 2.1.  CBC showed white blood cell 7, hemoglobin 12.2, platelets 175.  Troponin 32.   Chest x-ray showed cardiomegaly and no acute findings.  Patient started on amiodarone drip for VTach, received ASA 324mg  x1. -Admit to FPTS, cardiac telemetry, attending Dr. Ardelia Mems -Continuous cardiac monitoring and pulse oximetry -Cardiology following, appreciate recommendations - echocardiogram  -Amiodarone drip for now, likely metoprolol long-term if ef ok -Renal and heart healthy diet -Up with assistance -Patient seen at [facility], ROI sent -ASA 81 mg daily -Nitroglycerin sl 0.4 mg PRN  H/O CAD Catheterization in 12/2017 that showed mild nonobstructive disease in the proximal and mid LAD, moderate, heavily calcified proximal circumflex stenosis, RCA moderate caliber codominant vessel with mild disease, and inferior wall hypokinesis. - follow up echo - cards following appreciate recs  HTN BP in ED noted to be 181/75. Home medications include Imdur 60mg  daily, metoprolol 50 mg twice daily, amlodipine 5 mg daily. -Continue home medications  Neck and back pain Likely musculoskeletal. Received two doses of dilaudid in ed. Will treat with tylenol as needed. Tylenol 650mg  q 6 hours prn.  H/O DVT/PE PE in April 2019.  Patient was on warfarin for RA left lower extremity DVT. Home meds include Eliquis 2.5 mg twice daily. -Continue home meds  HLD Home medication: Lipitor 40 mg    History of anemia Hemoglobin on admission. Patient goes to [facility]. Home medication Auryxia 210mg  daily. - Continue home medications  ESRD Patient is on TTS schedule.  She completed dialysis today.  Next HD for Saturday. Home meds include tums.  -Continue tums  DM Home medication includes Levemir 10 units twice daily. -sSSI -Continue home Levemir  Diabetic neuropathy Patient takes gabapentin: 200 mg in the morning and 100 mg at night. -Continue  home medication  Bipolar disorder Home meds include Abilify. -Continue home meds  Tobacco Abuse Current smoker.  -Nicotine patch available if  patient wishes  FEN/GI: Renal and heart healthy diet Prophylaxis: Eliquis  Disposition: To cardiac telemetry pending cardiology work-up  History of Present Illness:  Robin [name] is a 68 y.o. female presenting with chest pain.  Symptoms started on 11/18 overnight. She states she had an aching chest pain, that got worse with exertion. It felt like something heavy on her chest. The pain resolved and she went to dialysis this am. She states that the chest pain returned near the end of her dialysis session. At that point she was transferred to [facility] for further workup. She had a brief syncopal episode in the ed with v tach. This resolved but did show as vtach on monitor. Started on amio gtt per cardiology.  She states that she has been having neck and back pain. She attributes this to the poor quality chairs at her dialysis clinic. She usually takes tylenol which helps a little bit.  Patient seen at the [facility] for primary care. Also sees a cardiologist there. She does not know most of her medications and refers history questions to daughter (who had left for food).  Review Of Systems: Per HPI with the following additions:   Review of Systems  Constitutional: Positive for chills. Negative for fever.  HENT: Negative.   Eyes: Negative.   Respiratory: Positive for cough. Negative for sputum production.   Cardiovascular: Positive for chest pain.  Gastrointestinal: Positive for diarrhea and heartburn. Negative for nausea and vomiting.  Genitourinary: Negative.   Musculoskeletal: Positive for back pain, joint pain and neck pain.  Skin: Negative.   Neurological: Positive for headaches.  Endo/Heme/Allergies: Negative.   Psychiatric/Behavioral: Negative.     Patient Active Problem List   Diagnosis Date Noted  . ESRD on hemodialysis ([place])   . Chest pain 09/16/2018  . Unstable angina ([place]) 12/26/2017  . Dyslipidemia   . End stage renal disease on dialysis ([place])   . Onychomycosis 10/07/2016   . Diabetic Charct's arthropathy ([place]) 10/07/2016  . Acute on chronic respiratory failure with hypoxia ([place]) 09/09/2016  . GERD (gastroesophageal reflux disease) 09/09/2016  . Depression 09/09/2016  . Acute renal failure superimposed on stage 4 chronic kidney disease ([place]) 09/09/2016  . Chronic diastolic (congestive) heart failure ([place]) 09/09/2016  . Elevated troponin 09/09/2016  . Abdominal pain 09/09/2016  . SIRS (systemic inflammatory response syndrome) ([place]) 08/08/2016  . CHF (congestive heart failure) ([place]) 08/07/2016  . Right leg DVT ([place]) 12/05/2015  . Charcot foot due to diabetes mellitus ([place])   . Hyperlipidemia 03/26/2015  . Charcot ankle 03/16/2015  . Confusion 01/21/2015  . Anemia 01/21/2015  . Multiple falls 01/21/2015  . Anemia, chronic renal failure   . Ventricular tachycardia ([place])   . Chronic renal disease, stage 4, severely decreased glomerular filtration rate between 15-29 mL/min/1.73 square meter (HCC)   . Anemia in chronic kidney disease 12/09/2014  . CKD (chronic kidney disease) 12/09/2014  . Hypertensive heart/renal disease with failure ([place]) 12/09/2014  . Bipolar affective disorder ([place]) 12/09/2014  . Acute encephalopathy   . Acute delirium 11/18/2014  . Type 2 diabetes mellitus with hyperglycemia ([place]) 11/18/2014  . Essential hypertension 11/18/2014  . Chronic combined systolic and diastolic CHF (congestive heart failure) ([place]) 11/18/2014  . Fracture dislocation of ankle 11/17/2014  . Diabetes mellitus type 2, insulin dependent ([place]) 11/17/2014  . Hypokalemia 11/17/2014  . Benign essential  hypertension with delivery 11/17/2014  . Obesity 11/17/2014  . CAD (coronary artery disease), native coronary artery with 2 stents  11/17/2014    Past Medical History: Past Medical History:  Diagnosis Date  . Acute delirium 11/18/2014  . Acute encephalopathy   . Acute on chronic respiratory failure with hypoxia ([place]) 09/09/2016  . Acute respiratory failure with  hypoxia ([place]) 11/29/2015  . Anemia in chronic kidney disease 12/09/2014  . Anxiety   . Arthritis   . Benign hypertension   . Bipolar affective disorder ([place]) 12/09/2014  . CAD (coronary artery disease), native coronary artery with 2 stents  11/17/2014  . Charcot foot due to diabetes mellitus ([place])   . Chronic combined systolic and diastolic CHF (congestive heart failure) ([place]) 11/18/2014  . Closed left ankle fracture 11/17/2014  . Confusion 01/21/2015  . Depression   . Diabetes mellitus without complication ([place])   . End stage renal disease on dialysis ([place])   . Fracture dislocation of ankle 11/17/2014  . GERD (gastroesophageal reflux disease)   . Heart murmur   . History of blood transfusion   . History of bronchitis   . History of pneumonia   . Hyperlipidemia 03/26/2015  . Hypertensive heart/renal disease with failure ([place]) 12/09/2014  . Obesity 11/17/2014  . Onychomycosis 10/07/2016  . Right leg DVT ([place]) 12/05/2015  . SIRS (systemic inflammatory response syndrome) ([place]) 08/08/2016  . Sleep apnea   . Ventricular tachycardia [facility])     Past Surgical History: Past Surgical History:  Procedure Laterality Date  . ABDOMINAL HYSTERECTOMY    . ANKLE CLOSED REDUCTION N/A 11/17/2014   Procedure: CLOSED REDUCTION ANKLE;  Surgeon: Earlie Server, MD;  Location: [place];  Service: Orthopedics;  Laterality: N/A;  . ANKLE FUSION Left 03/16/2015   Procedure: Left Tibiocalcaneal Fusion;  Surgeon: Newt Minion, MD;  Location: [place];  Service: Orthopedics;  Laterality: Left;  . APPLICATION OF WOUND VAC Right 08/06/2016   Procedure: APPLICATION OF PREVENA WOUND VAC;  Surgeon: Newt Minion, MD;  Location: [place];  Service: Orthopedics;  Laterality: Right;  . AV FISTULA PLACEMENT Left [id number]   done at [place]     2 stent   . CHOLECYSTECTOMY    . CORONARY STENT PLACEMENT    . FOOT ARTHRODESIS Right 08/06/2016   Procedure: Right Foot Fusion Lisfranc Joint;  Surgeon: Newt Minion, MD;  Location: [place];  Service: Orthopedics;  Laterality: Right;  . HARDWARE REMOVAL Left 03/16/2015   Procedure: Removal Hardware Left Ankle;  Surgeon: Newt Minion, MD;  Location: [place];  Service: Orthopedics;  Laterality: Left;  . IR AV DIALY SHUNT INTRO NEEDLE/INTRACATH INITIAL W/PTA/IMG LEFT  01/05/2018  . IR US GUIDE VASC ACCESS LEFT  01/05/2018  . LEFT HEART CATH AND CORONARY ANGIOGRAPHY N/A 12/28/2017   Procedure: LEFT HEART CATH AND CORONARY ANGIOGRAPHY;  Surgeon: Burnell Blanks, MD;  Location: [facility];  Service: Cardiovascular;  Laterality: N/A;  . ORIF ANKLE FRACTURE Left 11/20/2014   Procedure: OPEN REDUCTION INTERNAL FIXATION (ORIF) ANKLE FRACTURE;  Surgeon: Renette Butters, MD;  Location: [facility];  Service: Orthopedics;  Laterality: Left;  . TONSILLECTOMY      Social History: Social History   Tobacco Use  . Smoking status: Current Every Day Smoker    Packs/day: 1.00    Types: Cigarettes  . Smokeless tobacco: Never Used  Substance Use Topics  . Alcohol use: No  . Drug use: No  Additional social history:  Please also refer to relevant sections of EMR.  Family History: Family History  Family history unknown: Yes    Allergies and Medications: No Known Allergies No current facility-administered medications on file prior to encounter.    Current Outpatient Medications on File Prior to Encounter  Medication Sig Dispense Refill  . acetaminophen (TYLENOL) 325 MG tablet Take 325-975 mg by mouth every 6 (six) hours as needed for mild pain or headache.    . albuterol (VENTOLIN HFA) 108 (90 Base) MCG/ACT inhaler Inhale 1-2 puffs into the lungs every 6 (six) hours as needed for wheezing or shortness of breath.    . ARIPiprazole (ABILIFY) 15 MG tablet Take 7.5 mg by mouth daily.     Marland Kitchen atorvastatin (LIPITOR) 40 MG tablet Take 20 mg by mouth at bedtime.     Lorin Picket 1 GM 210 MG(Fe) tablet Take 210 mg by mouth daily.    Marland Kitchen dextrose (GLUTOSE) 40 % GEL  Take 1 Tube by mouth once as needed (for blood sugar less than 60).     Marland Kitchen ELIQUIS 5 MG TABS tablet Take 2.5 mg by mouth 2 (two) times daily.    Marland Kitchen gabapentin (NEURONTIN) 100 MG capsule Take 100 mg by mouth 2 (two) times daily. Take 200mg   (two capsule) in the morning and 100mg  (1 capsules) at night    . insulin detemir (LEVEMIR) 100 UNIT/ML injection Inject 0.08 mLs (8 Units total) into the skin at bedtime. (Patient taking differently: Inject 20 Units into the skin 2 (two) times daily. ) 10 mL 11  . insulin lispro (HUMALOG) 100 UNIT/ML injection Inject 3-15 Units into the skin 3 (three) times daily before meals. Pt uses as needed per sliding scale:    150-200:  3  units 201-250:  5 units 251-300:  8 units 301-350:  10 units 351-400:  12 units and call MD 400+ 15 units    . isosorbide mononitrate (IMDUR) 60 MG 24 hr tablet Take 60 mg by mouth daily.     . Melatonin 5 MG TABS Take 10 mg by mouth at bedtime.     . metoprolol tartrate (LOPRESSOR) 50 MG tablet Take 75 mg by mouth 2 (two) times daily.     . multivitamin (RENA-VIT) TABS tablet Take 1 tablet by mouth at bedtime.   10  . pantoprazole (PROTONIX) 20 MG tablet Take 20 mg by mouth 2 (two) times daily.     Marland Kitchen amLODipine (NORVASC) 5 MG tablet Take 1 tablet (5 mg total) by mouth daily. (Patient not taking: Reported on 04/14/2019) 90 tablet 1  . ammonium lactate (LAC-HYDRIN) 12 % lotion Apply 1 application topically 2 (two) times daily as needed (for itching/irritation).     Marland Kitchen aspirin 81 MG chewable tablet Chew 1 tablet (81 mg total) by mouth daily. (Patient not taking: Reported on 04/14/2019) 30 tablet 3  . enoxaparin (LOVENOX) 80 MG/0.8ML injection Inject 0.8 mLs (80 mg total) into the skin daily for 7 days. 7 Syringe 0  . nitroGLYCERIN (NITROSTAT) 0.4 MG SL tablet Place 0.4 mg under the tongue every 5 (five) minutes as needed for chest pain.      Objective: BP (!) 110/93   Pulse 60   Temp 97.8 F (36.6 C) (Oral)   Resp 13   SpO2 100%   Exam: Physical Exam Constitutional:      General: She is not in acute distress.    Appearance: She is obese. She is not ill-appearing or diaphoretic.  HENT:     Head: Normocephalic and atraumatic.  Eyes:     Extraocular Movements: Extraocular movements intact.     Pupils: Pupils are equal, round, and reactive to light.  Cardiovascular:     Rate and Rhythm: Regular rhythm. Tachycardia present.     Heart sounds: Normal heart sounds. Heart sounds not distant. No murmur. No friction rub. No gallop.   Musculoskeletal: Normal range of motion.     Comments: Trace bilateral LE edema  Skin:    General: Skin is warm and dry.  Neurological:     General: No focal deficit present.     Mental Status: She is alert and oriented to person, place, and time.  Psychiatric:        Mood and Affect: Mood normal.        Behavior: Behavior normal.     Labs and Imaging: CBC BMET  Recent Labs  Lab 04/14/19 1124  04/14/19 1136  WBC 7.0  --   --   HGB 12.2   < > 13.6  HCT 37.1   < > 40.0  PLT 175  --   --    < > = values in this interval not displayed.   Recent Labs  Lab 04/14/19 1124  04/14/19 1136  NA 139   < > 135  K 4.4   < > 5.1  CL 94*   < > 97*  CO2 29  --   --   BUN 24*   < > 41*  CREATININE 4.88*   < > 5.00*  GLUCOSE 168*   < > 150*  CALCIUM 8.9  --   --    < > = values in this interval not displayed.     EKG Interpretation  Date/Time:  Thursday April 14 2019 11:12:42 EST Ventricular Rate:  63 PR Interval:    QRS Duration: 88 QT Interval:  482 QTC Calculation: 494 R Axis:   -20 Text Interpretation: Sinus rhythm Borderline left axis deviation Repol abnrm suggests ischemia, diffuse leads Interpretation limited secondary to artifact Confirmed by Theotis Burrow [phone]) on 04/14/2019 11:30:41 AM  Dg Chest Port 1 View  Result Date: 04/14/2019 CLINICAL DATA:  Chest pain during dialysis. EXAM: PORTABLE CHEST 1 VIEW COMPARISON:  09/16/2018 FINDINGS: Cardiomediastinal  contours remain markedly enlarged. The pacer defibrillator pads and leads project over the chest on the left. No signs of consolidation or pleural effusion. No frank pulmonary edema. No acute bone finding. IMPRESSION: Cardiomegaly without acute cardiopulmonary disease. Electronically Signed   By: Zetta Bills M.D.   On: 04/14/2019 12:04   [name], MD 04/14/2019, 2:58 PM PGY-1, [place] Intern pager: [phone], text pages welcome --------------------------------------------------------------------------------------------- Upper Level Addendum: I have seen and evaluated this patient along with Dr. Chauncey Reading and reviewed the above note, making necessary revisions in blue.  Guadalupe Dawn MD PGY-3 Family Medicine Resident

## 2019-04-14 NOTE — ED Triage Notes (Signed)
Pt here from dialysis, called out for cp/sob starting at 1000. Pt given 3 nitro by dialysis, had syncopal episode when EMS stood her up to get on stretcher. Pt in NSR, then in vt on arrival to ED.

## 2019-04-15 ENCOUNTER — Encounter (HOSPITAL_COMMUNITY): Payer: Self-pay | Admitting: Family Medicine

## 2019-04-15 ENCOUNTER — Inpatient Hospital Stay (HOSPITAL_COMMUNITY): Payer: Medicare Other

## 2019-04-15 DIAGNOSIS — R42 Dizziness and giddiness: Secondary | ICD-10-CM

## 2019-04-15 DIAGNOSIS — R001 Bradycardia, unspecified: Secondary | ICD-10-CM

## 2019-04-15 DIAGNOSIS — E1169 Type 2 diabetes mellitus with other specified complication: Secondary | ICD-10-CM

## 2019-04-15 DIAGNOSIS — E785 Hyperlipidemia, unspecified: Secondary | ICD-10-CM

## 2019-04-15 DIAGNOSIS — Z992 Dependence on renal dialysis: Secondary | ICD-10-CM

## 2019-04-15 DIAGNOSIS — I2510 Atherosclerotic heart disease of native coronary artery without angina pectoris: Secondary | ICD-10-CM

## 2019-04-15 DIAGNOSIS — R55 Syncope and collapse: Secondary | ICD-10-CM

## 2019-04-15 DIAGNOSIS — R079 Chest pain, unspecified: Secondary | ICD-10-CM

## 2019-04-15 LAB — CBC WITH DIFFERENTIAL/PLATELET
Abs Immature Granulocytes: 0.03 K/uL (ref 0.00–0.07)
Basophils Absolute: 0.1 K/uL (ref 0.0–0.1)
Basophils Relative: 1 %
Eosinophils Absolute: 0.1 K/uL (ref 0.0–0.5)
Eosinophils Relative: 2 %
HCT: 36 % (ref 36.0–46.0)
Hemoglobin: 11.8 g/dL — ABNORMAL LOW (ref 12.0–15.0)
Immature Granulocytes: 1 %
Lymphocytes Relative: 21 %
Lymphs Abs: 1.1 K/uL (ref 0.7–4.0)
MCH: 33.5 pg (ref 26.0–34.0)
MCHC: 32.8 g/dL (ref 30.0–36.0)
MCV: 102.3 fL — ABNORMAL HIGH (ref 80.0–100.0)
Monocytes Absolute: 0.6 K/uL (ref 0.1–1.0)
Monocytes Relative: 11 %
Neutro Abs: 3.4 K/uL (ref 1.7–7.7)
Neutrophils Relative %: 64 %
Platelets: 170 K/uL (ref 150–400)
RBC: 3.52 MIL/uL — ABNORMAL LOW (ref 3.87–5.11)
RDW: 18.5 % — ABNORMAL HIGH (ref 11.5–15.5)
WBC: 5.3 K/uL (ref 4.0–10.5)
nRBC: 0 % (ref 0.0–0.2)

## 2019-04-15 LAB — HEMOGLOBIN A1C
Hgb A1c MFr Bld: 10 % — ABNORMAL HIGH (ref 4.8–5.6)
Mean Plasma Glucose: 240.3 mg/dL

## 2019-04-15 LAB — RENAL FUNCTION PANEL
Albumin: 3.3 g/dL — ABNORMAL LOW (ref 3.5–5.0)
Anion gap: 16 — ABNORMAL HIGH (ref 5–15)
BUN: 39 mg/dL — ABNORMAL HIGH (ref 8–23)
CO2: 28 mmol/L (ref 22–32)
Calcium: 9.2 mg/dL (ref 8.9–10.3)
Chloride: 92 mmol/L — ABNORMAL LOW (ref 98–111)
Creatinine, Ser: 7.02 mg/dL — ABNORMAL HIGH (ref 0.44–1.00)
GFR calc Af Amer: 6 mL/min — ABNORMAL LOW
GFR calc non Af Amer: 6 mL/min — ABNORMAL LOW
Glucose, Bld: 351 mg/dL — ABNORMAL HIGH (ref 70–99)
Phosphorus: 7.9 mg/dL — ABNORMAL HIGH (ref 2.5–4.6)
Potassium: 4.6 mmol/L (ref 3.5–5.1)
Sodium: 136 mmol/L (ref 135–145)

## 2019-04-15 LAB — GLUCOSE, CAPILLARY
Glucose-Capillary: 286 mg/dL — ABNORMAL HIGH (ref 70–99)
Glucose-Capillary: 340 mg/dL — ABNORMAL HIGH (ref 70–99)
Glucose-Capillary: 226 mg/dL — ABNORMAL HIGH (ref 70–99)
Glucose-Capillary: 177 mg/dL — ABNORMAL HIGH (ref 70–99)

## 2019-04-15 LAB — ECHOCARDIOGRAM COMPLETE
Height: 64 in
Weight: 2601.6 oz

## 2019-04-15 MED ORDER — CHLORHEXIDINE GLUCONATE CLOTH 2 % EX PADS
6.0000 | MEDICATED_PAD | Freq: Every day | CUTANEOUS | Status: DC
  Administered 2019-04-15 – 2019-04-17 (×3): 6 via TOPICAL

## 2019-04-15 MED ORDER — INSULIN ASPART 100 UNIT/ML ~~LOC~~ SOLN
0.0000 [IU] | Freq: Three times a day (TID) | SUBCUTANEOUS | Status: DC
  Administered 2019-04-15: 4 [IU] via SUBCUTANEOUS
  Administered 2019-04-15: 18:00:00 2 [IU] via SUBCUTANEOUS
  Administered 2019-04-16: 12:00:00 4 [IU] via SUBCUTANEOUS
  Administered 2019-04-16: 07:00:00 1 [IU] via SUBCUTANEOUS

## 2019-04-15 MED ORDER — INSULIN ASPART 100 UNIT/ML ~~LOC~~ SOLN
0.0000 [IU] | Freq: Every day | SUBCUTANEOUS | Status: DC
  Administered 2019-04-16: 22:00:00 2 [IU] via SUBCUTANEOUS

## 2019-04-15 MED ORDER — FERRIC CITRATE 1 GM 210 MG(FE) PO TABS
420.0000 mg | ORAL_TABLET | Freq: Three times a day (TID) | ORAL | Status: DC
  Administered 2019-04-15 – 2019-04-17 (×6): 420 mg via ORAL
  Filled 2019-04-15 (×9): qty 2

## 2019-04-15 NOTE — Progress Notes (Addendum)
Progress Note  [name] Date of Encounter: 04/15/2019  Primary Cardiologist: Sinclair Grooms, MD   Subjective   Patient feels [facility].  Worse with lying flat.  Inpatient Medications    Scheduled Meds: . amLODipine  5 mg Oral Daily  . apixaban  2.5 mg Oral BID  . ARIPiprazole  7.5 mg Oral Daily  . aspirin  324 mg Oral NOW   Or  . aspirin  300 mg Rectal NOW  . aspirin EC  81 mg Oral Daily  . atorvastatin  20 mg Oral QHS  . calcium carbonate  1 tablet Oral Once  . insulin detemir  10 Units Subcutaneous BID  . isosorbide mononitrate  60 mg Oral Daily  . metoprolol tartrate  75 mg Oral BID  . pantoprazole  20 mg Oral BID   Continuous Infusions: . amiodarone 30 mg/hr (04/15/19 0935)   PRN Meds: acetaminophen, albuterol, nitroGLYCERIN   Vital Signs    Vitals:   04/15/19 0310 04/15/19 0316 04/15/19 0925 04/15/19 0928  BP:  120/62 132/66   Pulse:  (!) 54  60  Resp:  12    Temp:  98.2 F (36.8 C)    TempSrc:  Oral    SpO2:  95%    Weight: 73.8 kg     Height:        Intake/Output Summary (Last 24 hours) at 04/15/2019 1018 Last data filed at 04/15/2019 0600 Gross per 24 hour  Intake 1006.72 ml  Output -  Net 1006.72 ml   Last 3 Weights 04/15/2019 04/14/2019 09/17/2018  Weight (lbs) 162 lb 9.6 oz 173 lb 15.1 oz 165 lb 3.2 oz  Weight (kg) 73.755 kg 78.9 kg 74.934 kg      Telemetry    Sinus brady- Personally Reviewed  ECG    NSR, PAC, ST depression- unchanged from prior - Personally Reviewed  Physical Exam   GEN: No acute distress.   Neck: No JVD Cardiac: RRR, no murmurs, rubs, or gallops.  Respiratory: Clear to auscultation bilaterally. GI: Soft, nontender, non-distended  MS: No edema; No deformity. Neuro:  Nonfocal  Psych: Normal affect   Labs    High Sensitivity Troponin:   Recent Labs  Lab 04/14/19 1124 04/14/19 1404  TROPONINIHS 32* 32*      Chemistry Recent Labs  Lab 04/14/19 1124 04/14/19 1130 04/14/19 1136  04/15/19 0413  NA 139 137 135 136  K 4.4 4.3 5.1 4.6  CL 94* 96* 97* 92*  CO2 29  --   --  28  GLUCOSE 168* 151* 150* 351*  BUN 24* 38* 41* 39*  CREATININE 4.88* 5.20* 5.00* 7.02*  CALCIUM 8.9  --   --  9.2  ALBUMIN  --   --   --  3.3*  GFRNONAA 9*  --   --  6*  GFRAA 10*  --   --  6*  ANIONGAP 16*  --   --  16*     Hematology Recent Labs  Lab 04/14/19 1124 04/14/19 1130 04/14/19 1136 04/15/19 0413  WBC 7.0  --   --  5.3  RBC 3.65*  --   --  3.52*  HGB 12.2 13.3 13.6 11.8*  HCT 37.1 39.0 40.0 36.0  MCV 101.6*  --   --  102.3*  MCH 33.4  --   --  33.5  MCHC 32.9  --   --  32.8  RDW 18.5*  --   --  18.5*  PLT  175  --   --  170    BNPNo results for input(s): BNP, PROBNP in the last 168 hours.   DDimer No results for input(s): DDIMER in the last 168 hours.   Radiology    Dg Chest Port 1 View  Result Date: 04/14/2019 CLINICAL DATA:  Chest pain during dialysis. EXAM: PORTABLE CHEST 1 VIEW COMPARISON:  09/16/2018 FINDINGS: Cardiomediastinal contours remain markedly enlarged. The pacer defibrillator pads and leads project over the chest on the left. No signs of consolidation or pleural effusion. No frank pulmonary edema. No acute bone finding. IMPRESSION: Cardiomegaly without acute cardiopulmonary disease. Electronically Signed   By: Zetta Bills M.D.   On: 04/14/2019 12:04    Cardiac Studies   Echo from April 2020 showed LVEF of 76% with diastolic dysfunction.  There is mild RV enlargement and mild RV dysfunction.  There was severe pulmonary hypertension with right ventricular systolic pressure of 73 mmHg.  I personally reviewed echo images from today.  EF 50-55%.  Patient Profile     68 y.o. female with CAD, ESRD  Assessment & Plan    1) CAD/pulmonary hypertension: I reviewed her records from the [place].  She was hospitalized in June 2020.  The principal reason for hospitalization was actually psychiatric rather than cardiac.  Despite what the daughter told us  yesterday, there is no record of a cardiac cath or echocardiogram being done.  Further examination of the records show a televisit on December 28, 2018.  They mention the pulmonary embolus that she had in April 2020.  There is concern about anticoagulation due to her prior GI bleed.   2) DVT/PE.  This is likely contributing to her pulmonary hypertension.  She is currently on Eliquis.  From the December 28, 2018 visit, it appears that the plan was to keep her on Eliquis 2.5 twice daily without aspirin.  On higher dose of Eliquis, she had bleeding around the "port site."  Right heart cath was being considered at the [place].  No need for RHC at this time.   3) social: Reading the notes there, it appears the daughter expressed the same frustrations that she did with me yesterday.'s please see my note from yesterday.  The patient is very inactive per her daughter's report. " She will shower herself once a week and that is a fight."  The daughter is the power of attorney and healthcare power of attorney.  NSVT: We will check echocardiogram.  If ejection fraction is unchanged, will likely just treat with beta-blocker.  I personally reviewed the tele strips from EMS yesterday.  It appears that her "Tanna Furry" was actually artifact.  THere are spikes thoughout the wide complex tracing, and there is pulsatility to the oxygen sat tracing as well.  Best seen on an ECG tracing from EMS.  Stop Amio.  Titrate beta blocker to HR.  There was some shortlived NSVT on ER tele yesterday, but nothing sustained.  I suspect her syncope was related to 3 SL NTG tabs and then standing up.  She has low fuinctional capactiy with multiple comorbidities.  WOUld consider palliative care consult fro goals of care.  May need case manager consult for placement given the daughter's frustrations, and the difficulties caring for her.    ESRD: She states less fluid than usual was removed at dialysis yesterday.  Not sure if this is contributing to her  Shortness of breath.  SHe is deconditioned, obese and a smoker so it is likely multifactorial.  For questions or updates, please contact [place] Please consult [email or web address] for contact info under        Signed, Larae Grooms, MD  04/15/2019, 10:18 AM

## 2019-04-15 NOTE — Progress Notes (Signed)
*  PRELIMINARY RESULTS* Echocardiogram 2D Echocardiogram has been performed.  Leavy Cella 04/15/2019, 10:49 AM

## 2019-04-15 NOTE — Progress Notes (Signed)
Family Medicine Teaching Service Daily Progress Note Intern Pager: [phone]  [name] Medical record number: [id number] Date of birth: 10/24/1950 Age: 68 y.o. Gender: female  Primary Care Provider: [facility] [place] Consultants: Cardiology Code Status: Full  Pt Overview and Major Events to Date:  04/14/2019: admitted for NSVT, CP, syncope  Assessment and Plan: Robin [name] is a 68 y.o. female presenting with chest pain, syncopal episode, V. tach. PMH is significant for ESRD TTS, CAD (cath in August 2019 with diffuse stenosis), HTN, bipolar affective disorder, DVT, DM, HLD.  VTach  Syncope  chest pain Pt no longer has CP. Troponins trended flat at 32. Cardiology recommended echo, performed last night, awaiting results. Only needs cath if troponins rose, which they have not. Amiodarone recommendations pending echo results today. EKG reveals 1st degree AV block, bradycardia to 56, and sinus arrhythmia. -Continuous cardiac monitoring and pulse oximetry -Cardiology following, appreciate recommendations - echocardiogram  -Amiodarone change from drip to PO, continuance dependent on echo and cardiology recommendations. -Patient seen at [facility], ROI sent, awaiting records -ASA 81 mg daily -Nitroglycerin sl 0.4 mg PRN  QTc prolongation QTc prolonged today to 513 from borderline 494. Pt has h/o QTc on the longer side, at 510 in April. Will discontinue zofran. -Stop zofran  H/O CAD Catheterization in 12/2017 that showed mild nonobstructive disease in the proximal and mid LAD, moderate, heavily calcified proximal circumflex stenosis, RCA moderate caliber codominant vessel with mild disease, and inferior wall hypokinesis. - follow up echo - cards following appreciate recs  HTN BP in ED noted to be 181/75. Home medications include Imdur 60mg  daily, metoprolol 50 mg twice daily, amlodipine 5 mg daily. -Continue home medications  Neck and back pain Likely  musculoskeletal. Received two doses of dilaudid in ed. Will treat with tylenol as needed. Tylenol 650mg  q 6 hours prn.  H/O DVT/PE PE in April 2019.  Patient was on warfarin for RA left lower extremity DVT. Home meds include Eliquis 2.5 mg twice daily. -Continue home meds  HLD Home medication: Lipitor 40 mg    History of anemia Hemoglobin on admission. Patient [facility] to [facility]. Home medication Auryxia 210mg  daily. - Continue home medications  ESRD Patient is on TTS schedule.  She completed dialysis today.  Next HD for Saturday. Home meds include tums.  -Continue tums  DM Home medication includes Levemir 10 units twice daily. -sSSI -Continue home Levemir  Diabetic neuropathy Patient takes gabapentin: 200 mg in the morning and 100 mg at night. -Continue home medication  Bipolar disorder Home meds include Abilify. -Continue home meds  Tobacco Abuse Current smoker.  -Nicotine patch available if patient wishes  FEN/GI: renal and heart healthy diet PPx: eliquis  Disposition: to telemetry pending Cardiology workup  Subjective:  Patient does not have chest pain today, no diarrhea. Awaiting echo results and cardiology plan.  Objective: Temp:  [97.8 F (36.6 C)-98.3 F (36.8 C)] 98.2 F (36.8 C) (11/20 0316) Pulse Rate:  [48-67] 54 (11/20 0316) Resp:  [11-19] 12 (11/20 0316) BP: (108-181)/(35-120) 120/62 (11/20 0316) SpO2:  [84 %-100 %] 95 % (11/20 0316) Weight:  [73.8 kg-78.9 kg] 73.8 kg (11/20 0310) Physical Exam: General: older, pleasant woman, resting comfortably in bed, NAD, overweight Cardiovascular: bradycardic rate, irregular rhythm, no murmur/rub/gallop Respiratory: CTAB, no increased WOB, no rhonchi/rales Abdomen: soft, NT, ND, normal bowel sounds present Extremities: warm, only trace LE edema bilaterally  Laboratory: Recent Labs  Lab 04/14/19 1124 04/14/19 1130 04/14/19 1136 04/15/19  0413  WBC 7.0  --   --  5.3  HGB 12.2 13.3 13.6 11.8*   HCT 37.1 39.0 40.0 36.0  PLT 175  --   --  170   Recent Labs  Lab 04/14/19 1124 04/14/19 1130 04/14/19 1136 04/15/19 0413  NA 139 137 135 136  K 4.4 4.3 5.1 4.6  CL 94* 96* 97* 92*  CO2 29  --   --  28  BUN 24* 38* 41* 39*  CREATININE 4.88* 5.20* 5.00* 7.02*  CALCIUM 8.9  --   --  9.2  GLUCOSE 168* 151* 150* 351*    EKG Interpretation  Date/Time:  Thursday April 14 2019 11:12:42 EST Ventricular Rate:  63 PR Interval:    QRS Duration: 88 QT Interval:  482 QTC Calculation: 494 R Axis:   -20 Text Interpretation: Sinus rhythm Borderline left axis deviation Repol abnrm suggests ischemia, diffuse leads Interpretation limited secondary to artifact Confirmed by Theotis Burrow ([id number]) on 04/14/2019 11:30:41 AM   Imaging/Diagnostic Tests: Dg Chest Port 1 View  Result Date: 04/14/2019 CLINICAL DATA:  Chest pain during dialysis. EXAM: PORTABLE CHEST 1 VIEW COMPARISON:  09/16/2018 FINDINGS: Cardiomediastinal contours remain markedly enlarged. The pacer defibrillator pads and leads project over the chest on the left. No signs of consolidation or pleural effusion. No frank pulmonary edema. No acute bone finding. IMPRESSION: Cardiomegaly without acute cardiopulmonary disease. Electronically Signed   By: Zetta Bills M.D.   On: 04/14/2019 12:04    [name], MD 04/15/2019, 8:14 AM PGY-1, [place] Intern pager: [phone], text pages welcome

## 2019-04-15 NOTE — Consult Note (Addendum)
[facility] Renal Consultation Note    Indication for Consultation:  Management of ESRD/hemodialysis, anemia, hypertension/volume, and secondary hyperparathyroidism. PCP:  HPI: Robin [name] is a 68 y.o. female with ESRD, CAD, HTN, combined HF, T2DM, GERD, Depression who was admitted with CP and syncope.   Pt brought via EMS to ED after experiencing severe CP which was unrelieved by NTG followed by syncopal episode at HD yesterday (11/19). EMS noted brief runs of VT on monitoring - resolved back to sinus in ED. She was started on amiodarone drip. EKG was stable without ST changes. CXR clear. Initial labs showed Na 139, K 4.4, Ca 8.9, WBC 7, Hgb 12.2, Trop 32 -> later trended and stable 32.  Cardiology was consulted and revaluated her. Apparently, had been having some CP for day preceding event although with intermittent palpitations for months. She last saw her usual cardiologist [facility]) about 2-3 months ago - cardiac cath done at that time - no changes made.  Today, she was seen in room. RN in room with plan for orthostatic vitals. She reports that she feels "funny" - slightly SOB and slightly dizzy. Her responses today seem slower than her baseline. Denies recent N/V, fever, diarrhea.  She dialyzes at [facility] clinic - last HD was yesterday (partial) prior to ED transfer. In general, she is very compliant with her meds and treatments. Has not been meeting her EDW consistently - dry weight may need to be raised slightly.  On my interview she is asking about going home but also reporting some SOB   Past Medical History:  Diagnosis Date  . Acute delirium 11/18/2014  . Acute encephalopathy   . Acute on chronic respiratory failure with hypoxia ([place]) 09/09/2016  . Acute respiratory failure with hypoxia ([place]) 11/29/2015  . Anemia in chronic kidney disease 12/09/2014  . Anxiety   . Arthritis   . Benign hypertension   . Bipolar affective disorder ([place]) 12/09/2014  . CAD (coronary  artery disease), native coronary artery with 2 stents  11/17/2014  . Charcot foot due to diabetes mellitus ([place])   . Chronic combined systolic and diastolic CHF (congestive heart failure) ([place]) 11/18/2014  . Closed left ankle fracture 11/17/2014  . Confusion 01/21/2015  . Depression   . Diabetes mellitus without complication ([place])   . End stage renal disease on dialysis ([place])   . Fracture dislocation of ankle 11/17/2014  . GERD (gastroesophageal reflux disease)   . Heart murmur   . History of blood transfusion   . History of bronchitis   . History of pneumonia   . Hyperlipidemia 03/26/2015  . Hypertension associated with diabetes ([place]) 11/18/2014  . Hypertensive heart/renal disease with failure ([place]) 12/09/2014  . Hypokalemia 11/17/2014  . Multiple falls 01/21/2015  . Obesity 11/17/2014  . Onychomycosis 10/07/2016  . Right leg DVT ([place]) 12/05/2015  . SIRS (systemic inflammatory response syndrome) ([place]) 08/08/2016  . Sleep apnea   . Unstable angina ([place]) 12/26/2017  . Ventricular tachycardia [name])    Past Surgical History:  Procedure Laterality Date  . ABDOMINAL HYSTERECTOMY    . ANKLE CLOSED REDUCTION N/A 11/17/2014   Procedure: CLOSED REDUCTION ANKLE;  Surgeon: Earlie Server, MD;  Location: [place];  Service: Orthopedics;  Laterality: N/A;  . ANKLE FUSION Left 03/16/2015   Procedure: Left Tibiocalcaneal Fusion;  Surgeon: Newt Minion, MD;  Location: [place];  Service: Orthopedics;  Laterality: Left;  . APPLICATION OF WOUND VAC Right 08/06/2016   Procedure: APPLICATION OF PREVENA WOUND VAC;  Surgeon: Newt Minion, MD;  Location: [place];  Service: Orthopedics;  Laterality: Right;  . AV FISTULA PLACEMENT Left [id number]   done at [place]     2 stent   . CHOLECYSTECTOMY    . CORONARY STENT PLACEMENT    . FOOT ARTHRODESIS Right 08/06/2016   Procedure: Right Foot Fusion Lisfranc Joint;  Surgeon: Newt Minion, MD;  Location: [place];  Service: Orthopedics;  Laterality:  Right;  . HARDWARE REMOVAL Left 03/16/2015   Procedure: Removal Hardware Left Ankle;  Surgeon: Newt Minion, MD;  Location: [place];  Service: Orthopedics;  Laterality: Left;  . IR AV DIALY SHUNT INTRO NEEDLE/INTRACATH INITIAL W/PTA/IMG LEFT  01/05/2018  . IR US GUIDE VASC ACCESS LEFT  01/05/2018  . LEFT HEART CATH AND CORONARY ANGIOGRAPHY N/A 12/28/2017   Procedure: LEFT HEART CATH AND CORONARY ANGIOGRAPHY;  Surgeon: Burnell Blanks, MD;  Location: [place] CV LAB;  Service: Cardiovascular;  Laterality: N/A;  . ORIF ANKLE FRACTURE Left 11/20/2014   Procedure: OPEN REDUCTION INTERNAL FIXATION (ORIF) ANKLE FRACTURE;  Surgeon: Renette Butters, MD;  Location: [place];  Service: Orthopedics;  Laterality: Left;  . TONSILLECTOMY     Family History  Family history unknown: Yes   Social History:  reports that she has been smoking cigarettes. She has been smoking about 1.00 pack per day. She has never used smokeless tobacco. She reports that she does not drink alcohol or use drugs.  ROS: As per HPI otherwise negative.  Physical Exam: Vitals:   04/15/19 0310 04/15/19 0316 04/15/19 0925 04/15/19 0928  BP:  120/62 132/66   Pulse:  (!) 54  60  Resp:  12    Temp:  98.2 F (36.8 C)    TempSrc:  Oral    SpO2:  95%    Weight: 73.8 kg     Height:         General: Well developed, well nourished, in no acute distress. Head: Normocephalic, atraumatic, sclera non-icteric, mucus membranes are moist. Neck: Supple without lymphadenopathy/masses. JVD not elevated. Lungs: Clear bilaterally to auscultation without wheezes, rales, or rhonchi. Breathing is unlabored. Heart: RRR with normal S1, S2. No murmurs, rubs, or gallops appreciated. Abdomen: Soft, non-tender, non-distended with normoactive bowel sounds. No rebound/guarding.  Musculoskeletal:  Strength and tone appear normal for age. Lower extremities: No edema or ischemic changes, no open wounds. Neuro: Alert and oriented X 3. Moves all  extremities spontaneously. Psych:  Responds to questions appropriately, but with slow response time. Dialysis Access: LUE AVF + thrill  No Known Allergies Prior to Admission medications   Medication Sig Start Date End Date Taking? Authorizing Provider  acetaminophen (TYLENOL) 325 MG tablet Take 325-975 mg by mouth every 6 (six) hours as needed for mild pain or headache.   Yes [provider]  albuterol (VENTOLIN HFA) 108 (90 Base) MCG/ACT inhaler Inhale 1-2 puffs into the lungs every 6 (six) hours as needed for wheezing or shortness of breath.   Yes [provider]  ARIPiprazole (ABILIFY) 15 MG tablet Take 7.5 mg by mouth daily.    Yes [provider]  atorvastatin (LIPITOR) 40 MG tablet Take 20 mg by mouth at bedtime.    Yes [provider]  AURYXIA 1 GM 210 MG(Fe) tablet Take 210 mg by mouth daily. 02/22/19  Yes [provider]  dextrose (GLUTOSE) 40 % GEL Take 1 Tube by mouth once as needed (for blood sugar less than 60).  Yes [provider]  ELIQUIS 5 MG TABS tablet Take 2.5 mg by mouth 2 (two) times daily. 10/21/18  Yes [provider]  gabapentin (NEURONTIN) 100 MG capsule Take 100 mg by mouth 2 (two) times daily. Take 200mg   (two capsule) in the morning and 100mg  (1 capsules) at night   Yes [provider]  insulin detemir (LEVEMIR) 100 UNIT/ML injection Inject 0.08 mLs (8 Units total) into the skin at bedtime. Patient taking differently: Inject 20 Units into the skin 2 (two) times daily.  09/15/16  Yes Oswald Hillock, MD  insulin lispro (HUMALOG) 100 UNIT/ML injection Inject 3-15 Units into the skin 3 (three) times daily before meals. Pt uses as needed per sliding scale:    150-200:  3  units 201-250:  5 units 251-300:  8 units 301-350:  10 units 351-400:  12 units and call MD 400+ 15 units   Yes [provider]  isosorbide mononitrate (IMDUR) 60 MG 24 hr tablet Take 60 mg by mouth daily.    Yes [provider]  Melatonin 5 MG TABS Take 10 mg by mouth at bedtime.    Yes [provider]  metoprolol tartrate (LOPRESSOR) 50 MG tablet Take 75 mg by mouth 2 (two) times daily.    Yes [provider]  multivitamin (RENA-VIT) TABS tablet Take 1 tablet by mouth at bedtime.  12/11/17  Yes [provider]  pantoprazole (PROTONIX) 20 MG tablet Take 20 mg by mouth 2 (two) times daily.    Yes [provider]  amLODipine (NORVASC) 5 MG tablet Take 1 tablet (5 mg total) by mouth daily. Patient not taking: Reported on 04/14/2019 09/18/18   Lars Mage, MD  ammonium lactate (LAC-HYDRIN) 12 % lotion Apply 1 application topically 2 (two) times daily as needed (for itching/irritation).     [provider]  aspirin 81 MG chewable tablet Chew 1 tablet (81 mg total) by mouth daily. Patient not taking: Reported on 04/14/2019 12/30/17   Asencion Noble, MD  nitroGLYCERIN (NITROSTAT) 0.4 MG SL tablet Place 0.4 mg under the tongue every 5 (five) minutes as needed for chest pain.    [provider]   Current Facility-Administered Medications  Medication Dose Route Frequency Provider Last Rate Last Dose  . acetaminophen (TYLENOL) tablet 650 mg  650 mg Oral Q4H PRN Guadalupe Dawn, MD   650 mg at 04/15/19 0306  . albuterol (PROVENTIL) (2.5 MG/3ML) 0.083% nebulizer solution 2.5 mg  2.5 mg Nebulization Q6H PRN Guadalupe Dawn, MD      . amiodarone (NEXTERONE PREMIX) 360-4.14 MG/200ML-% (1.8 mg/mL) IV infusion  30 mg/hr Intravenous Continuous Guadalupe Dawn, MD 16.67 mL/hr at 04/15/19 0935 30 mg/hr at 04/15/19 0935  . amLODipine (NORVASC) tablet 5 mg  5 mg Oral Daily Guadalupe Dawn, MD   5 mg at 04/15/19 0925  . apixaban (ELIQUIS) tablet 2.5 mg  2.5 mg Oral BID Guadalupe Dawn, MD   2.5 mg at 04/15/19 9628  . ARIPiprazole (ABILIFY) tablet 7.5 mg  7.5 mg Oral Daily Guadalupe Dawn, MD      . aspirin chewable tablet 324 mg  324 mg Oral NOW Guadalupe Dawn, MD    Stopped at 04/14/19 1600   Or  . aspirin suppository 300 mg  300 mg Rectal NOW Guadalupe Dawn, MD      . aspirin EC tablet 81 mg  81 mg Oral Daily Guadalupe Dawn, MD   81 mg at 04/15/19 0925  . atorvastatin (LIPITOR) tablet  20 mg  20 mg Oral QHS Guadalupe Dawn, MD   20 mg at 04/14/19 2120  . calcium carbonate (TUMS - dosed in mg elemental calcium) chewable tablet 200 mg of elemental calcium  1 tablet Oral Once Guadalupe Dawn, MD      . insulin detemir (LEVEMIR) injection 10 Units  10 Units Subcutaneous BID Guadalupe Dawn, MD   10 Units at 04/15/19 [phone]  . isosorbide mononitrate (IMDUR) 24 hr tablet 60 mg  60 mg Oral Daily Guadalupe Dawn, MD   60 mg at 04/15/19 0925  . metoprolol tartrate (LOPRESSOR) tablet 75 mg  75 mg Oral BID Leeanne Rio, MD   75 mg at 04/15/19 5537  . nitroGLYCERIN (NITROSTAT) SL tablet 0.4 mg  0.4 mg Sublingual Q5 Min x 3 PRN Guadalupe Dawn, MD      . pantoprazole (PROTONIX) EC tablet 20 mg  20 mg Oral BID Guadalupe Dawn, MD   20 mg at 04/15/19 4827   Labs: Basic Metabolic Panel: Recent Labs  Lab 04/14/19 1124 04/14/19 1130 04/14/19 1136 04/15/19 0413  NA 139 137 135 136  K 4.4 4.3 5.1 4.6  CL 94* 96* 97* 92*  CO2 29  --   --  28  GLUCOSE 168* 151* 150* 351*  BUN 24* 38* 41* 39*  CREATININE 4.88* 5.20* 5.00* 7.02*  CALCIUM 8.9  --   --  9.2  PHOS  --   --   --  7.9*   Liver Function Tests: Recent Labs  Lab 04/15/19 0413  ALBUMIN 3.3*   CBC: Recent Labs  Lab 04/14/19 1124 04/14/19 1130 04/14/19 1136 04/15/19 0413  WBC 7.0  --   --  5.3  NEUTROABS 5.1  --   --  3.4  HGB 12.2 13.3 13.6 11.8*  HCT 37.1 39.0 40.0 36.0  MCV 101.6*  --   --  102.3*  PLT 175  --   --  170   CBG: Recent Labs  Lab 04/14/19 1629 04/14/19 2109 04/15/19 0632  GLUCAP 142* 363* 286*   Studies/Results: Dg Chest Port 1 View  Result Date: 04/14/2019 CLINICAL DATA:  Chest pain during dialysis. EXAM: PORTABLE CHEST 1 VIEW COMPARISON:  09/16/2018  FINDINGS: Cardiomediastinal contours remain markedly enlarged. The pacer defibrillator pads and leads project over the chest on the left. No signs of consolidation or pleural effusion. No frank pulmonary edema. No acute bone finding. IMPRESSION: Cardiomegaly without acute cardiopulmonary disease. Electronically Signed   By: Zetta Bills M.D.   On: 04/14/2019 12:04   Dialysis Orders:  TTS at [facility] 3:30hr, 400/A1.5, EDW 72kg, 2K/2Ca, UFP #1, AVF, no heparin - Calcitriol 0.8mcg PO q HD, no ESA  Assessment/Plan: 1.  Chest pain: Trop flat. Short VT runs on admit - amiodarone started. Cardiology following. Looks like plan is to get recent LHC results from [place] and get Echo today to decide course of treatment. 2.  ESRD:  Next due for HD tomorrow - continue TTS schedule. 3.  Hypertension/volume: BP controlled - looks like her EDW could probably be raised 0.5-1kg to see if helps.  CXR without pulm edema. 4.  Anemia: Hgb 11.8 - no ESA needed for now. 5.  Metabolic bone disease: Ca ok, Phos ^ - resume home binder Lorin Picket). Not on VDRA. 6. CAD 7. T2DM: Glu ^ today - per primary. 8.  Hx PE: On Eliquis.  Veneta Penton, PA-C 04/15/2019, 10:25 AM  [facility] Pager: [phone]  Patient seen and examined, agree with above  note with above modifications. ESRD pt - had episode after HD yesterday notable for VT in the ER-  Cardiology following.  We will arrange her routine HD tomorrow.  I agree that EDW may be too low as OP- will be gentle with UF while in house  Corliss Parish, MD 04/15/2019

## 2019-04-16 DIAGNOSIS — R079 Chest pain, unspecified: Secondary | ICD-10-CM

## 2019-04-16 DIAGNOSIS — E1159 Type 2 diabetes mellitus with other circulatory complications: Secondary | ICD-10-CM

## 2019-04-16 DIAGNOSIS — I10 Essential (primary) hypertension: Secondary | ICD-10-CM

## 2019-04-16 DIAGNOSIS — I120 Hypertensive chronic kidney disease with stage 5 chronic kidney disease or end stage renal disease: Secondary | ICD-10-CM

## 2019-04-16 DIAGNOSIS — I25110 Atherosclerotic heart disease of native coronary artery with unstable angina pectoris: Principal | ICD-10-CM

## 2019-04-16 DIAGNOSIS — E119 Type 2 diabetes mellitus without complications: Secondary | ICD-10-CM

## 2019-04-16 DIAGNOSIS — E1122 Type 2 diabetes mellitus with diabetic chronic kidney disease: Secondary | ICD-10-CM

## 2019-04-16 DIAGNOSIS — Z794 Long term (current) use of insulin: Secondary | ICD-10-CM

## 2019-04-16 LAB — CBC
HCT: 31.8 % — ABNORMAL LOW (ref 36.0–46.0)
Hemoglobin: 10.3 g/dL — ABNORMAL LOW (ref 12.0–15.0)
MCH: 32.8 pg (ref 26.0–34.0)
MCHC: 32.4 g/dL (ref 30.0–36.0)
MCV: 101.3 fL — ABNORMAL HIGH (ref 80.0–100.0)
Platelets: 150 10*3/uL (ref 150–400)
RBC: 3.14 MIL/uL — ABNORMAL LOW (ref 3.87–5.11)
RDW: 17.9 % — ABNORMAL HIGH (ref 11.5–15.5)
WBC: 5.5 10*3/uL (ref 4.0–10.5)
nRBC: 0 % (ref 0.0–0.2)

## 2019-04-16 LAB — BASIC METABOLIC PANEL
Anion gap: 17 — ABNORMAL HIGH (ref 5–15)
BUN: 52 mg/dL — ABNORMAL HIGH (ref 8–23)
CO2: 25 mmol/L (ref 22–32)
Calcium: 9 mg/dL (ref 8.9–10.3)
Chloride: 93 mmol/L — ABNORMAL LOW (ref 98–111)
Creatinine, Ser: 8.7 mg/dL — ABNORMAL HIGH (ref 0.44–1.00)
GFR calc Af Amer: 5 mL/min — ABNORMAL LOW (ref >60)
GFR calc non Af Amer: 4 mL/min — ABNORMAL LOW (ref >60)
Glucose, Bld: 211 mg/dL — ABNORMAL HIGH (ref 70–99)
Potassium: 4.4 mmol/L (ref 3.5–5.1)
Sodium: 135 mmol/L (ref 135–145)

## 2019-04-16 LAB — GLUCOSE, CAPILLARY
Glucose-Capillary: 193 mg/dL — ABNORMAL HIGH (ref 70–99)
Glucose-Capillary: 318 mg/dL — ABNORMAL HIGH (ref 70–99)
Glucose-Capillary: 68 mg/dL — ABNORMAL LOW (ref 70–99)
Glucose-Capillary: 99 mg/dL (ref 70–99)
Glucose-Capillary: 228 mg/dL — ABNORMAL HIGH (ref 70–99)

## 2019-04-16 NOTE — Progress Notes (Signed)
Pt's CBG rechecked and is currently 99.  Pt is currently eating a full meal.  Pt will continue to be monitored.

## 2019-04-16 NOTE — Progress Notes (Signed)
Progress Note  [name] Date of Encounter: 04/16/2019  Primary Cardiologist: Sinclair Grooms, MD  [facility], [place] Cardiologist)  Subjective   No angina today. No dizziness or syncope.  Inpatient Medications    Scheduled Meds:  amLODipine  5 mg Oral Daily   apixaban  2.5 mg Oral BID   ARIPiprazole  7.5 mg Oral Daily   aspirin EC  81 mg Oral Daily   atorvastatin  20 mg Oral QHS   calcium carbonate  1 tablet Oral Once   Chlorhexidine Gluconate Cloth  6 each Topical Q0600   ferric citrate  420 mg Oral TID WC   insulin aspart  0-5 Units Subcutaneous QHS   insulin aspart  0-6 Units Subcutaneous TID WC   insulin detemir  10 Units Subcutaneous BID   isosorbide mononitrate  60 mg Oral Daily   metoprolol tartrate  75 mg Oral BID   pantoprazole  20 mg Oral BID   Continuous Infusions:  PRN Meds: acetaminophen, albuterol, nitroGLYCERIN   Vital Signs    Vitals:   04/15/19 2128 04/15/19 2130 04/15/19 2200 04/16/19 0533  BP: 136/62   (!) 143/60  Pulse: (!) 59 62 60 62  Resp: 12  14 12   Temp: 98.6 F (37 C)   98.4 F (36.9 C)  TempSrc: Oral   Oral  SpO2: 95%  94% 92%  Weight:    74.6 kg  Height:        Intake/Output Summary (Last 24 hours) at 04/16/2019 1003 Last data filed at 04/15/2019 2100 Gross per 24 hour  Intake 240 ml  Output --  Net 240 ml   Last 3 Weights 04/16/2019 04/15/2019 04/14/2019  Weight (lbs) 164 lb 8 oz 162 lb 9.6 oz 173 lb 15.1 oz  Weight (kg) 74.617 kg 73.755 kg 78.9 kg  Some encounter information is confidential and restricted. Go to Review Flowsheets activity to see all data.      Telemetry    Two episodes of 3-beat NSVT around 1800h, one 6-beat episode of NSVTat 2137h - Personally Reviewed  ECG    Sinus rhythm with a single PAC, LVH with secondary report station abnormalities, cannot exclude superimposed inferolateral ischemia - Personally Reviewed  Physical Exam  Obese GEN: No acute distress.   Neck:  No JVD Cardiac: RRR, no murmurs, rubs, or gallops. Excellent thrill/bruit over her L upper arm AV graft. Respiratory: Clear to auscultation bilaterally. GI: Soft, nontender, non-distended  MS: No edema; No deformity. Neuro:  Nonfocal  Psych: Normal affect   Labs    High Sensitivity Troponin:   Recent Labs  Lab 04/14/19 1124 04/14/19 1404  TROPONINIHS 32* 32*      Chemistry Recent Labs  Lab 04/14/19 1124  04/14/19 1136 04/15/19 0413 04/16/19 0239  NA 139   < > 135 136 135  K 4.4   < > 5.1 4.6 4.4  CL 94*   < > 97* 92* 93*  CO2 29  --   --  28 25  GLUCOSE 168*   < > 150* 351* 211*  BUN 24*   < > 41* 39* 52*  CREATININE 4.88*   < > 5.00* 7.02* 8.70*  CALCIUM 8.9  --   --  9.2 9.0  ALBUMIN  --   --   --  3.3*  --   GFRNONAA 9*  --   --  6* 4*  GFRAA 10*  --   --  6* 5*  ANIONGAP 16*  --   --  16* 17*   < > = values in this interval not displayed.     Hematology Recent Labs  Lab 04/14/19 1124  04/14/19 1136 04/15/19 0413 04/16/19 0239  WBC 7.0  --   --  5.3 5.5  RBC 3.65*  --   --  3.52* 3.14*  HGB 12.2   < > 13.6 11.8* 10.3*  HCT 37.1   < > 40.0 36.0 31.8*  MCV 101.6*  --   --  102.3* 101.3*  MCH 33.4  --   --  33.5 32.8  MCHC 32.9  --   --  32.8 32.4  RDW 18.5*  --   --  18.5* 17.9*  PLT 175  --   --  170 150   < > = values in this interval not displayed.    BNPNo results for input(s): BNP, PROBNP in the last 168 hours.   DDimer No results for input(s): DDIMER in the last 168 hours.   Radiology    Dg Chest Port 1 View  Result Date: 04/14/2019 CLINICAL DATA:  Chest pain during dialysis. EXAM: PORTABLE CHEST 1 VIEW COMPARISON:  09/16/2018 FINDINGS: Cardiomediastinal contours remain markedly enlarged. The pacer defibrillator pads and leads project over the chest on the left. No signs of consolidation or pleural effusion. No frank pulmonary edema. No acute bone finding. IMPRESSION: Cardiomegaly without acute cardiopulmonary disease. Electronically Signed    By: Zetta Bills M.D.   On: 04/14/2019 12:04    Cardiac Studies   Echo 04/15/2019    1. Left ventricular ejection fraction, by visual estimation, is 50 to 55%. The left ventricle has low normal function. There is moderately increased left ventricular hypertrophy.  2. Left ventricular diastolic parameters are consistent with Grade II diastolic dysfunction (pseudonormalization).  3. The left ventricle has no regional wall motion abnormalities.  4. Global right ventricle has normal systolic function.The right ventricular size is normal. No increase in right ventricular wall thickness.  5. Left atrial size was mildly dilated.  6. Right atrial size was mild-moderately dilated.  7. Small pericardial effusion.  8. The mitral valve is normal in structure. Trace mitral valve regurgitation.  9. The tricuspid valve is normal in structure. Tricuspid valve regurgitation is trivial. 10. The aortic valve is tricuspid. Aortic valve regurgitation is not visualized. No evidence of aortic valve sclerosis or stenosis. 11. The pulmonic valve was grossly normal. Pulmonic valve regurgitation is not visualized. 12. The inferior vena cava is normal in size with greater than 50% respiratory variability, suggesting right atrial pressure of 3 mmHg.  cardiac catheterization in 12/2017  - mild non-obstructive disease in the proximal and mid LAD - moderate, heavily calcified proximal circumflex stenosis (did not appear flow limiting) "This lesion would be difficult to approach with PCI given the heavy calcification and the involvement of the intermediate branch which has a stent that extends back to the Circumflex" - intermediate branch has a proximal stent that is patent with mild to moderate stent restenosis - RCA moderate caliber co-dominant vessel with mild disease - inferior wall hypokinesis.  Patient Profile     68 y.o. female with a hx of ESRD on HD TTS, OSA, CAD s/p stent ramus intermedius, HLD, DM, HTN,  pulmonary embolism April 2020, obesity, and bipolar disorder seen for chest pain during HD, brief syncope after SL NTG, NSVT on monitor.  Assessment & Plan    1. CAD: No evidence of acute coronary syndrome by cardiac enzymes and ECG, although abnormal is unchanged from  previous tracings.  Coronary angiography a year ago showed a moderate ostial left circumflex stenosis with challenging anatomy for PCI.  Medical therapy is still recommended.  Antiplatelet therapy is indicated, even though she is on anticoagulants as well.  Continue aspirin.  On 3 antianginal medications (metoprolol, amlodipine, isosorbide mononitrate).  Not sure about safety of using Ranexa in ESRD and would avoid it. 2. NSVT: Brief episodes of VT were seen yesterday evening, clearly not artifact.  However the relationship between the arrhythmia and her episode of syncope is very much uncertain.  It is much more likely she had syncope due to 3 consecutive nitroglycerin tablets following hemodialysis.  Continue beta-blockers (the current dose is probably maximal tolerated due to relative bradycardia).  Avoid unscheduled interruptions and beta-blocker therapy due to risk of arrhythmia/hypertension/angina rebound.  Amiodarone has been stopped.  Recommend extended outpatient event monitor (14 days). 3. Recent PE: Has had a pulmonary embolism within the last 12 months and is on Eliquis.  Dose adjusted for recurrent bleeding problems (History of GI bleeding, bleeding around dialysis access site).     For questions or updates, please contact [place] Please consult [email or web address] for contact info under        Signed, Sanda Klein, MD  04/16/2019, 10:03 AM

## 2019-04-16 NOTE — Progress Notes (Signed)
Pt's CBG 68 after dialysis at 1830.  Pt given orange juice to drink.  Will recheck CBG in 15 mins.

## 2019-04-16 NOTE — Progress Notes (Signed)
Subjective:  Cardiology work up is looking negative-  Felt that "VT" was artifact-  EF actually improved from past- due for HD later today - pt wants to go home-  Getting ready to work with PT   Objective Vital signs in last 24 hours: Vitals:   04/15/19 2130 04/15/19 2200 04/16/19 0533 04/16/19 1040  BP:   (!) 143/60 (!) 147/59  Pulse: 62 60 62 62  Resp:  14 12 15   Temp:   98.4 F (36.9 C) 98.3 F (36.8 C)  TempSrc:   Oral Oral  SpO2:  94% 92% 93%  Weight:   74.6 kg   Height:       Weight change: -4.283 kg  Intake/Output Summary (Last 24 hours) at 04/16/2019 1059 Last data filed at 04/15/2019 2100 Gross per 24 hour  Intake 240 ml  Output -  Net 240 ml    Dialysis Orders:  TTS at [facility] 3:30hr, 400/A1.5, EDW 72kg, 2K/2Ca, UFP #1, AVF, no heparin - Calcitriol 0.50mcg PO q HD, no ESA  Assessment/Plan: 1.  Chest pain: Trop flat. Short VT runs on admit - amiodarone started. Cardiology following. repeat echo shows actually better EF- they felt that VT was artifact.   not sure if any work up is planned 2.  ESRD:  Next due for HD today - continue TTS schedule. Next week would be holiday sched-  M/W/Sat here or at [place] [facility] 3.  Hypertension/volume: BP controlled - looks like her EDW could probably be raised 0.5-1kg to see if helps.  CXR without pulm edema. 4.  Anemia: Hgb 11.8---10.3 - no ESA needed for now. 5.  Metabolic bone disease: Ca ok, Phos ^ - resume home binder Lorin Picket). Not on VDRA. 6. CAD 7. T2DM: Glu ^ today - per primary. 8.  Hx PE: On Eliquis. 9. Dispo-  Renal service would be fine with discharge today if that is what is decided     Hurt: Basic Metabolic Panel: Recent Labs  Lab 04/14/19 1124  04/14/19 1136 04/15/19 0413 04/16/19 0239  NA 139   < > 135 136 135  K 4.4   < > 5.1 4.6 4.4  CL 94*   < > 97* 92* 93*  CO2 29  --   --  28 25  GLUCOSE 168*   < > 150* 351* 211*  BUN 24*   < > 41* 39* 52*  CREATININE 4.88*   < > 5.00*  7.02* 8.70*  CALCIUM 8.9  --   --  9.2 9.0  PHOS  --   --   --  7.9*  --    < > = values in this interval not displayed.   Liver Function Tests: Recent Labs  Lab 04/15/19 0413  ALBUMIN 3.3*   No results for input(s): LIPASE, AMYLASE in the last 168 hours. No results for input(s): AMMONIA in the last 168 hours. CBC: Recent Labs  Lab 04/14/19 1124  04/14/19 1136 04/15/19 0413 04/16/19 0239  WBC 7.0  --   --  5.3 5.5  NEUTROABS 5.1  --   --  3.4  --   HGB 12.2   < > 13.6 11.8* 10.3*  HCT 37.1   < > 40.0 36.0 31.8*  MCV 101.6*  --   --  102.3* 101.3*  PLT 175  --   --  170 150   < > = values in this interval not displayed.   Cardiac Enzymes: No results for  input(s): CKTOTAL, CKMB, CKMBINDEX, TROPONINI in the last 168 hours. CBG: Recent Labs  Lab 04/15/19 0632 04/15/19 1136 04/15/19 1658 04/15/19 2205 04/16/19 0629  GLUCAP 286* 340* 226* 177* 193*    Iron Studies: No results for input(s): IRON, TIBC, TRANSFERRIN, FERRITIN in the last 72 hours. Studies/Results: Dg Chest Port 1 View  Result Date: 04/14/2019 CLINICAL DATA:  Chest pain during dialysis. EXAM: PORTABLE CHEST 1 VIEW COMPARISON:  09/16/2018 FINDINGS: Cardiomediastinal contours remain markedly enlarged. The pacer defibrillator pads and leads project over the chest on the left. No signs of consolidation or pleural effusion. No frank pulmonary edema. No acute bone finding. IMPRESSION: Cardiomegaly without acute cardiopulmonary disease. Electronically Signed   By: Zetta Bills M.D.   On: 04/14/2019 12:04   Medications: Infusions:   Scheduled Medications: . amLODipine  5 mg Oral Daily  . apixaban  2.5 mg Oral BID  . ARIPiprazole  7.5 mg Oral Daily  . aspirin EC  81 mg Oral Daily  . atorvastatin  20 mg Oral QHS  . calcium carbonate  1 tablet Oral Once  . Chlorhexidine Gluconate Cloth  6 each Topical Q0600  . ferric citrate  420 mg Oral TID WC  . insulin aspart  0-5 Units Subcutaneous QHS  . insulin aspart   0-6 Units Subcutaneous TID WC  . insulin detemir  10 Units Subcutaneous BID  . isosorbide mononitrate  60 mg Oral Daily  . metoprolol tartrate  75 mg Oral BID  . pantoprazole  20 mg Oral BID    have reviewed scheduled and prn medications.  Physical Exam: General: sitting up on side of bed-  No c/o's - checking orthostatics- so far so good Heart: RRR Lungs: mostly clear Abdomen: soft, non tender Extremities: min edema Dialysis Access: left AVF- patent     04/16/2019,10:59 AM  LOS: 2 days

## 2019-04-16 NOTE — Evaluation (Signed)
Occupational Therapy Evaluation Patient Details Name: Robin [name] MRN: [id number] DOB: 1951-05-03 Today's Date: 04/16/2019    History of Present Illness 68 yo female with onset of chest pain in HD is referred to hosp, now to PT for mobility ck.  Pt is demonstrating orthostatic hypotension at admission, was in vent tachycardia, had syncopal episode in hosp ED.  PMHx:   cardiomegaly, HD, DM, HTN, CAD, bipolar disorder, DVT   Clinical Impression   Pt PTA: living alone with supportive available for 24/7 if needed. Pt reports independence with ADL and mobility. Pt currently with minimal deficits in activity tolerance and increased time. Pt able to don/doff socks at EOB, per forming toilet hygiene and stand at sink for grooming varying from supervisionA to minguardA. Pt mobilizing in room with RW with minguardA, but no physical assist required for sit to stand or transfers. VSS. Pt has shower chair in bathroom for conserving energy. Pt  With no c/o of dizziness or chest pain. Pt does not require continued OT skilled services. OT signing off.    Follow Up Recommendations  No OT follow up    Equipment Recommendations  None recommended by OT    Recommendations for Other Services       Precautions / Restrictions Precautions Precautions: Fall Precaution Comments: monitor for chest pain Restrictions Weight Bearing Restrictions: No      Mobility Bed Mobility Overal bed mobility: Needs Assistance Bed Mobility: Supine to Sit     Supine to sit: Supervision Sit to supine: Supervision   General bed mobility comments: no assist required.  Transfers Overall transfer level: Needs assistance Equipment used: Rolling walker (2 wheeled) Transfers: Sit to/from Stand Sit to Stand: Min guard         General transfer comment: minguardA for initial standing balance    Balance Overall balance assessment: Modified Independent Sitting-balance support: Feet supported Sitting balance-Leahy  Scale: Fair     Standing balance support: Bilateral upper extremity supported;During functional activity Standing balance-Leahy Scale: Poor Standing balance comment: RW for mobility and balance.                           ADL either performed or assessed with clinical judgement   ADL Overall ADL's : Needs assistance/impaired Eating/Feeding: Modified independent;Sitting   Grooming: Min guard;Standing   Upper Body Bathing: Set up;Sitting   Lower Body Bathing: Min guard;Sitting/lateral leans;Sit to/from stand   Upper Body Dressing : Set up;Sitting   Lower Body Dressing: Min guard;Sitting/lateral leans;Sit to/from stand   Toilet Transfer: Supervision/safety   Toileting- Water quality scientist and Hygiene: Min guard;Cueing for safety;Sitting/lateral lean;Sit to/from stand       Functional mobility during ADLs: Minimal assistance;+2 for safety/equipment;Rolling walker General ADL Comments: Pt limited by activity tolerance and SOB.      Vision Baseline Vision/History: No visual deficits Vision Assessment?: No apparent visual deficits     Perception     Praxis      Pertinent Vitals/Pain Pain Assessment: Faces Faces Pain Scale: Hurts even more Pain Location: chest pain after walking Pain Descriptors / Indicators: Aching Pain Intervention(s): Limited activity within patient's tolerance     Hand Dominance Right   Extremity/Trunk Assessment Upper Extremity Assessment Upper Extremity Assessment: Overall WFL for tasks assessed   Lower Extremity Assessment Lower Extremity Assessment: Generalized weakness   Cervical / Trunk Assessment Cervical / Trunk Assessment: Kyphotic   Communication Communication Communication: No difficulties   Cognition Arousal/Alertness: Awake/alert Behavior During Therapy: [facility]  for tasks assessed/performed Overall Cognitive Status: Within Functional Limits for tasks assessed                                      General Comments  very slow gait    Exercises    Shoulder Instructions      Home Living Family/patient expects to be discharged to:: Private residence Living Arrangements: Alone Available Help at Discharge: Family;Available 24 hours/day Type of Home: House Home Access: Stairs to enter [employer] of Steps: 2 Entrance Stairs-Rails: None Home Layout: Two level;Able to live on main level with bedroom/bathroom Alternate Level Stairs-Number of Steps: full flight   Bathroom Shower/Tub: Occupational psychologist: Standard     Home Equipment: Clinical cytogeneticist - 2 wheels          Prior Functioning/Environment Level of Independence: Independent with assistive device(s)                 OT Problem List: Decreased activity tolerance      OT Treatment/Interventions:      OT Goals(Current goals can be found in the care plan section) Acute Rehab OT Goals Patient Stated Goal: to feel better and relieve chest pain  OT Frequency:     Barriers to D/C:            Co-evaluation              AM-PAC OT "6 Clicks" Daily Activity     Outcome Measure Help from another person eating meals?: None Help from another person taking care of personal grooming?: None Help from another person toileting, which includes using toliet, bedpan, or urinal?: None Help from another person bathing (including washing, rinsing, drying)?: A Little Help from another person to put on and taking off regular upper body clothing?: None Help from another person to put on and taking off regular lower body clothing?: None 6 Click Score: 23   End of Session Equipment Utilized During Treatment: Gait belt;Rolling walker Nurse Communication: Mobility status  Activity Tolerance: Patient tolerated treatment well Patient left: in bed;with bed alarm set;with call bell/phone within reach  OT Visit Diagnosis: Unsteadiness on feet (R26.81);Muscle weakness (generalized) (M62.81)                 Time: 9485-4627 OT Time Calculation (min): 25 min Charges:  OT General Charges $OT Visit: 1 Visit OT Evaluation $OT Eval Moderate Complexity: 1 Mod OT Treatments $Self Care/Home Management : 8-22 mins  Ebony Hail Harold Hedge) Marsa Aris OTR/L Acute Rehabilitation Services Pager: [phone] Office: [place] 04/16/2019, 3:11 PM

## 2019-04-16 NOTE — Progress Notes (Signed)
Family Medicine Teaching Service Daily Progress Note Intern Pager: [phone]  [name] Medical record number: [id number] Date of birth: 03/19/1951 Age: 68 y.o. Gender: female  Primary Care Provider: [facility] Consultants: Cardiology Code Status: Full  Pt Overview and Major Events to Date:  04/14/2019: admitted for NSVT, CP, syncope  Assessment and Plan: Robin [name] is a 68 y.o. female presenting with chest pain, syncopal episode, V. tach. PMH is significant for ESRD TTS, CAD (cath in August 2019 with diffuse stenosis), HTN, bipolar affective disorder, DVT, DM, HLD.  VTach  Syncope  chest pain No CP today. According to cardiology note, pt had NSVT in ED, VT reported by EMS appears to be artifact. On telemetry, occasionaly PVCs with 1 run of NSVT for 7 beats. Amiodarone discontinued yesterday. Echo revealed LVEF low normal 50-55%, G2DD, moderate LVH, IVC w/ >50% variability during respiration indicating a R atrial pressure of 33mmHg (pulmonary htn, likely d/t h/o PE). Syncope due to standing s/p nitroglycerin administration. Additionally, patient did not have cath in summer of 2020. Daughter having trouble caring for mother, no safe disposition plan at this point. Will request PT/OT eval to assist with disposition. Discussed long term management of multiple chronic health problems and deconditioning with patient and she is interested in speaking with palliative care, will consult. -Continuous cardiac monitoring  -Cardiology following, appreciate recommendations -Consult palliative care -Patient seen at [facility], ROI sent, cardiology received records. -ASA 81 mg daily -Nitroglycerin sl 0.4 mg PRN -PT/OT eval and treat  QTc prolongation QTc prolonged yesterday to 513 from borderline 494. Pt has h/o QTc on the longer side, at 510 in April. Will discontinue zofran. -Stop zofran  H/O CAD Catheterization in 12/2017 that showed mild nonobstructive disease in  the proximal and mid LAD, moderate, heavily calcified proximal circumflex stenosis, RCA moderate caliber codominant vessel with mild disease, and inferior wall hypokinesis. See above for echo results. - Cardiology does not recommend right heart cath - Continue home medications  HTN BP in ED noted to be 181/75. Home medications include Imdur 60mg  daily, metoprolol 50 mg twice daily, amlodipine 5 mg daily. -Continue home medications  Neck and back pain Likely musculoskeletal. Received two doses of dilaudid in ed. Will treat with tylenol as needed. Tylenol 650mg  q 6 hours prn.  H/O DVT/PE PE in April 2019.  Patient was on warfarin for RA left lower extremity DVT. Home meds include Eliquis 2.5 mg twice daily. Likely the cause for pulmonary HTN seen on echo. -Continue home meds  HLD Home medication: Lipitor 40 mg    History of anemia Hemoglobin on admission. Patient goes to [name]. Home medication Auryxia 210mg  daily. - Continue home medications  ESRD Patient is on TTS schedule.  She completed dialysis today.  Next HD for Saturday. Home meds include tums.  -Continue tums -HD today on schedule  DM Home medication includes Levemir 10 units twice daily. -sSSI -Continue home Levemir  Diabetic neuropathy Patient takes gabapentin: 200 mg in the morning and 100 mg at night. -Continue home medication  Bipolar disorder Home meds include Abilify. -Continue home meds  Tobacco Abuse Current smoker.  -Nicotine patch available if patient wishes  FEN/GI: renal and heart healthy diet PPx: eliquis  Disposition: to telemetry pending disposition per PT/OT  Subjective:  Patient does not have chest pain today, no diarrhea. She is interested in speaking with palliative care about management of her many chronic health disorders.  Objective: Temp:  [98.4 F (  36.9 C)-98.6 F (37 C)] 98.4 F (36.9 C) (11/21 0533) Pulse Rate:  [57-65] 62 (11/21 0533) Resp:  [12-18] 12 (11/21  0533) BP: (121-143)/(59-66) 143/60 (11/21 0533) SpO2:  [92 %-100 %] 92 % (11/21 0533) Weight:  [74.6 kg] 74.6 kg (11/21 0533) Physical Exam: General: older, pleasant woman, resting comfortably in bed, NAD, overweight Cardiovascular: bradycardic rate, regular rhythm, no murmur/rub/gallop Respiratory: CTAB, no increased WOB, no rhonchi/rales Abdomen: soft, NT, ND, normal bowel sounds present Extremities: warm, no LE edema bilaterally  Laboratory: Recent Labs  Lab 04/14/19 1124  04/14/19 1136 04/15/19 0413 04/16/19 0239  WBC 7.0  --   --  5.3 5.5  HGB 12.2   < > 13.6 11.8* 10.3*  HCT 37.1   < > 40.0 36.0 31.8*  PLT 175  --   --  170 150   < > = values in this interval not displayed.   Recent Labs  Lab 04/14/19 1124  04/14/19 1136 04/15/19 0413 04/16/19 0239  NA 139   < > 135 136 135  K 4.4   < > 5.1 4.6 4.4  CL 94*   < > 97* 92* 93*  CO2 29  --   --  28 25  BUN 24*   < > 41* 39* 52*  CREATININE 4.88*   < > 5.00* 7.02* 8.70*  CALCIUM 8.9  --   --  9.2 9.0  GLUCOSE 168*   < > 150* 351* 211*   < > = values in this interval not displayed.    EKG Interpretation  Date/Time:  Thursday April 14 2019 11:12:42 EST Ventricular Rate:  63 PR Interval:    QRS Duration: 88 QT Interval:  482 QTC Calculation: 494 R Axis:   -20 Text Interpretation: Sinus rhythm Borderline left axis deviation Repol abnrm suggests ischemia, diffuse leads Interpretation limited secondary to artifact Confirmed by Theotis Burrow [phone]) on 04/14/2019 11:30:41 AM   Imaging/Diagnostic Tests: Dg Chest Port 1 View  Result Date: 04/14/2019 CLINICAL DATA:  Chest pain during dialysis. EXAM: PORTABLE CHEST 1 VIEW COMPARISON:  09/16/2018 FINDINGS: Cardiomediastinal contours remain markedly enlarged. The pacer defibrillator pads and leads project over the chest on the left. No signs of consolidation or pleural effusion. No frank pulmonary edema. No acute bone finding. IMPRESSION: Cardiomegaly without acute  cardiopulmonary disease. Electronically Signed   By: Zetta Bills M.D.   On: 04/14/2019 12:04    [name], MD 04/16/2019, 6:22 AM PGY-1, [place] Intern pager: [phone], text pages welcome

## 2019-04-16 NOTE — Evaluation (Signed)
Physical Therapy Evaluation Patient Details Name: Robin [name] MRN: [id number] DOB: 05-29-1950 Today's Date: 04/16/2019   History of Present Illness  68 yo female with onset of chest pain in HD is referred to hosp, now to PT for mobility ck.  Pt is demonstrating orthostatic hypotension at admission, was in vent tachycardia, had syncopal episode in hosp ED.  PMHx:   cardiomegaly, HD, DM, HTN, CAD, bipolar disorder, DVT  Clinical Impression  Pt had BP ck for supine:  157/62, pulse 62, sats 92%;  Sitting 154/68, pulse 63, sats 94% and standing 152/59, pulse 64, sats 98%.  Pt is able to walk with supervised help, and will be sent home with HHPT for follow up of her needs and safety with gait.  Family is there to help, and will anticipate her successful transition to home with these resources in place.  Nursing made aware pt had chest pain again after completion of eval, once back in bed.    Follow Up Recommendations Home health PT;Supervision for mobility/OOB    Equipment Recommendations  Rolling walker with 5" wheels    Recommendations for Other Services       Precautions / Restrictions Precautions Precautions: Fall Precaution Comments: monitor for chest pain Restrictions Weight Bearing Restrictions: No      Mobility  Bed Mobility Overal bed mobility: Needs Assistance Bed Mobility: Supine to Sit;Sit to Supine     Supine to sit: Min assist Sit to supine: Min assist   General bed mobility comments: min assist for support of trunk and to assist legs  Transfers Overall transfer level: Needs assistance Equipment used: Rolling walker (2 wheeled);1 person hand held assist Transfers: Sit to/from Stand Sit to Stand: Min assist         General transfer comment: min assist to power up and steady lightly for gait  Ambulation/Gait Ambulation/Gait assistance: Min guard Gait Distance (Feet): 40 Feet Assistive device: Rolling walker (2 wheeled);1 person hand held assist Gait  Pattern/deviations: Step-through pattern;Decreased stride length;Wide base of support Gait velocity: reduced Gait velocity interpretation: <1.31 ft/sec, indicative of household ambulator General Gait Details: pt is up to walk with help and controlled gait with pt self managing due to limited vision and her endurance  Stairs            Wheelchair Mobility    Modified Rankin (Stroke Patients Only)       Balance Overall balance assessment: Needs assistance Sitting-balance support: Feet supported Sitting balance-Leahy Scale: Fair     Standing balance support: Bilateral upper extremity supported;During functional activity Standing balance-Leahy Scale: Poor Standing balance comment: requires walker support for balance control                             Pertinent Vitals/Pain Pain Assessment: Faces Faces Pain Scale: Hurts even more Pain Location: chest pain after walking Pain Descriptors / Indicators: Aching Pain Intervention(s): Limited activity within patient's tolerance;Monitored during session;Other (comment)(appeared after walking, notified nursing)    Home Living Family/patient expects to be discharged to:: Private residence Living Arrangements: Alone Available Help at Discharge: Family;Available 24 hours/day Type of Home: House Home Access: Stairs to enter Entrance Stairs-Rails: None Entrance Stairs-Number of Steps: 2 Home Layout: Two level;Able to live on main level with bedroom/bathroom Home Equipment: Shower seat;Walker - 2 wheels      Prior Function Level of Independence: Independent with assistive device(s)  Hand Dominance   Dominant Hand: Right    Extremity/Trunk Assessment   Upper Extremity Assessment Upper Extremity Assessment: Overall WFL for tasks assessed    Lower Extremity Assessment Lower Extremity Assessment: Generalized weakness    Cervical / Trunk Assessment Cervical / Trunk Assessment: Kyphotic   Communication   Communication: No difficulties  Cognition Arousal/Alertness: Awake/alert Behavior During Therapy: WFL for tasks assessed/performed Overall Cognitive Status: Within Functional Limits for tasks assessed                                        General Comments General comments (skin integrity, edema, etc.): Pt is up to walk with help, slow paced and careful to assist herself with standing rests    Exercises     Assessment/Plan    PT Assessment Patient needs continued PT services  PT Problem List Decreased strength;Decreased range of motion;Decreased activity tolerance;Decreased balance;Decreased mobility;Decreased coordination;Decreased knowledge of use of DME;Cardiopulmonary status limiting activity       PT Treatment Interventions DME instruction;Gait training;Functional mobility training;Therapeutic activities;Therapeutic exercise;Balance training;Neuromuscular re-education;Patient/family education    PT Goals (Current goals can be found in the Care Plan section)  Acute Rehab PT Goals Patient Stated Goal: to feel better and relieve chest pain PT Goal Formulation: With patient Time For Goal Achievement: 04/30/19 Potential to Achieve Goals: Good    Frequency Min 3X/week   Barriers to discharge   home with family and level home    Co-evaluation               AM-PAC PT "6 Clicks" Mobility  Outcome Measure Help needed turning from your back to your side while in a flat bed without using bedrails?: A Little Help needed moving from lying on your back to sitting on the side of a flat bed without using bedrails?: A Little Help needed moving to and from a bed to a chair (including a wheelchair)?: A Little Help needed standing up from a chair using your arms (e.g., wheelchair or bedside chair)?: A Little Help needed to walk in hospital room?: A Little Help needed climbing 3-5 steps with a railing? : A Lot 6 Click Score: 17    End of Session  Equipment Utilized During Treatment: Gait belt Activity Tolerance: Patient tolerated treatment well;Patient limited by fatigue;Treatment limited secondary to medical complications (Comment) Patient left: in bed;with call bell/phone within reach;with bed alarm set Nurse Communication: Mobility status;Other (comment)(chest pain) PT Visit Diagnosis: Unsteadiness on feet (R26.81);Muscle weakness (generalized) (M62.81);Difficulty in walking, not elsewhere classified (R26.2)    Time: 2694-8546 PT Time Calculation (min) (ACUTE ONLY): 33 min   Charges:   PT Evaluation $PT Eval Moderate Complexity: 1 Mod PT Treatments $Gait Training: 8-22 mins       Ramond Dial 04/16/2019, 1:46 PM   Mee Hives, PT MS Acute Rehab Dept. Number: [place] [facility] [place]

## 2019-04-17 ENCOUNTER — Other Ambulatory Visit: Payer: Self-pay | Admitting: Physician Assistant

## 2019-04-17 DIAGNOSIS — I4729 Other ventricular tachycardia: Secondary | ICD-10-CM

## 2019-04-17 DIAGNOSIS — I472 Ventricular tachycardia: Secondary | ICD-10-CM

## 2019-04-17 LAB — CBC WITH DIFFERENTIAL/PLATELET
Abs Immature Granulocytes: 0.01 10*3/uL (ref 0.00–0.07)
Basophils Absolute: 0 10*3/uL (ref 0.0–0.1)
Basophils Relative: 1 %
Eosinophils Absolute: 0.1 10*3/uL (ref 0.0–0.5)
Eosinophils Relative: 2 %
HCT: 32.5 % — ABNORMAL LOW (ref 36.0–46.0)
Hemoglobin: 10.6 g/dL — ABNORMAL LOW (ref 12.0–15.0)
Immature Granulocytes: 0 %
Lymphocytes Relative: 16 %
Lymphs Abs: 0.8 10*3/uL (ref 0.7–4.0)
MCH: 32.9 pg (ref 26.0–34.0)
MCHC: 32.6 g/dL (ref 30.0–36.0)
MCV: 100.9 fL — ABNORMAL HIGH (ref 80.0–100.0)
Monocytes Absolute: 0.6 10*3/uL (ref 0.1–1.0)
Monocytes Relative: 11 %
Neutro Abs: 3.6 10*3/uL (ref 1.7–7.7)
Neutrophils Relative %: 70 %
Platelets: 148 10*3/uL — ABNORMAL LOW (ref 150–400)
RBC: 3.22 MIL/uL — ABNORMAL LOW (ref 3.87–5.11)
RDW: 17.9 % — ABNORMAL HIGH (ref 11.5–15.5)
WBC: 5.1 10*3/uL (ref 4.0–10.5)
nRBC: 0 % (ref 0.0–0.2)

## 2019-04-17 LAB — RENAL FUNCTION PANEL
Albumin: 3 g/dL — ABNORMAL LOW (ref 3.5–5.0)
Anion gap: 12 (ref 5–15)
BUN: 24 mg/dL — ABNORMAL HIGH (ref 8–23)
CO2: 28 mmol/L (ref 22–32)
Calcium: 8.7 mg/dL — ABNORMAL LOW (ref 8.9–10.3)
Chloride: 95 mmol/L — ABNORMAL LOW (ref 98–111)
Creatinine, Ser: 5.1 mg/dL — ABNORMAL HIGH (ref 0.44–1.00)
GFR calc Af Amer: 9 mL/min — ABNORMAL LOW (ref >60)
GFR calc non Af Amer: 8 mL/min — ABNORMAL LOW (ref >60)
Glucose, Bld: 100 mg/dL — ABNORMAL HIGH (ref 70–99)
Phosphorus: 4 mg/dL (ref 2.5–4.6)
Potassium: 3.8 mmol/L (ref 3.5–5.1)
Sodium: 135 mmol/L (ref 135–145)

## 2019-04-17 LAB — GLUCOSE, CAPILLARY
Glucose-Capillary: 85 mg/dL (ref 70–99)
Glucose-Capillary: 139 mg/dL — ABNORMAL HIGH (ref 70–99)

## 2019-04-17 MED ORDER — METOPROLOL TARTRATE 75 MG PO TABS: 75.0000 mg | ORAL_TABLET | Freq: Two times a day (BID) | ORAL | 0 refills | Status: DC

## 2019-04-17 NOTE — Progress Notes (Signed)
Pt discharged today to home with daughter.  Pt's IV removed.  Pt taken off telemetry and CCMD notified.  AVS documentation reviewed with Pt and legal guardian (Daughter, Nadean Montanaro) and all questions answered.

## 2019-04-17 NOTE — Progress Notes (Signed)
Progress Note  [name] Date of Encounter: 04/17/2019  Primary Cardiologist: Sinclair Grooms, MD   Subjective   Had hemodialysis yesterday evening with removal of 1.5 L, on all her usual antihypertensive/antianginal medications.  Did not develop problems with hypotension or angina pectoris. Feels well this morning, denies dyspnea or chest discomfort and is eager to go home.  Inpatient Medications    Scheduled Meds:  amLODipine  5 mg Oral Daily   apixaban  2.5 mg Oral BID   ARIPiprazole  7.5 mg Oral Daily   aspirin EC  81 mg Oral Daily   atorvastatin  20 mg Oral QHS   calcium carbonate  1 tablet Oral Once   Chlorhexidine Gluconate Cloth  6 each Topical Q0600   ferric citrate  420 mg Oral TID WC   insulin aspart  0-5 Units Subcutaneous QHS   insulin aspart  0-6 Units Subcutaneous TID WC   insulin detemir  10 Units Subcutaneous BID   isosorbide mononitrate  60 mg Oral Daily   metoprolol tartrate  75 mg Oral BID   pantoprazole  20 mg Oral BID   Continuous Infusions:  PRN Meds: acetaminophen, albuterol, nitroGLYCERIN   Vital Signs    Vitals:   04/16/19 1955 04/16/19 2204 04/17/19 0300 04/17/19 0457  BP: (!) 135/57 (!) 150/64  (!) 158/71  Pulse: 60 64  66  Resp: 16  19 15   Temp: 98.3 F (36.8 C)   (!) 97.5 F (36.4 C)  TempSrc: Oral   Oral  SpO2: 95%  95% 90%  Weight:   74.1 kg   Height:        Intake/Output Summary (Last 24 hours) at 04/17/2019 1044 Last data filed at 04/16/2019 1713 Gross per 24 hour  Intake --  Output 1500 ml  Net -1500 ml   Last 3 Weights 04/17/2019 04/16/2019 04/16/2019  Weight (lbs) 163 lb 5.8 oz 165 lb 2 oz 168 lb 6.9 oz  Weight (kg) 74.1 kg 74.9 kg 76.4 kg  Some encounter information is confidential and restricted. Go to Review Flowsheets activity to see all data.      Telemetry    Sinus rhythm with occasional PVCs- Personally Reviewed  ECG    No new tracing- Personally Reviewed  Physical  Exam  Obese GEN: No acute distress.   Neck: No JVD Cardiac: RRR, no murmurs, rubs, or gallops.  Excellent thrill/bruit overlying the left upper arm AV graft Respiratory: Clear to auscultation bilaterally. GI: Soft, nontender, non-distended  MS: No edema; No deformity. Neuro:  Nonfocal  Psych: Normal affect   Labs    High Sensitivity Troponin:   Recent Labs  Lab 04/14/19 1124 04/14/19 1404  TROPONINIHS 32* 32*      Chemistry Recent Labs  Lab 04/15/19 0413 04/16/19 0239 04/17/19 0218  NA 136 135 135  K 4.6 4.4 3.8  CL 92* 93* 95*  CO2 28 25 28   GLUCOSE 351* 211* 100*  BUN 39* 52* 24*  CREATININE 7.02* 8.70* 5.10*  CALCIUM 9.2 9.0 8.7*  ALBUMIN 3.3*  --  3.0*  GFRNONAA 6* 4* 8*  GFRAA 6* 5* 9*  ANIONGAP 16* 17* 12     Hematology Recent Labs  Lab 04/15/19 0413 04/16/19 0239 04/17/19 0218  WBC 5.3 5.5 5.1  RBC 3.52* 3.14* 3.22*  HGB 11.8* 10.3* 10.6*  HCT 36.0 31.8* 32.5*  MCV 102.3* 101.3* 100.9*  MCH 33.5 32.8 32.9  MCHC 32.8 32.4 32.6  RDW 18.5* 17.9*  17.9*  PLT 170 150 148*    BNPNo results for input(s): BNP, PROBNP in the last 168 hours.   DDimer No results for input(s): DDIMER in the last 168 hours.   Radiology    No results found.  Cardiac Studies   Echo 04/15/2019   1. Left ventricular ejection fraction, by visual estimation, is 50 to 55%. The left ventricle has low normal function. There is moderately increased left ventricular hypertrophy. 2. Left ventricular diastolic parameters are consistent with Grade II diastolic dysfunction (pseudonormalization). 3. The left ventricle has no regional wall motion abnormalities. 4. Global right ventricle has normal systolic function.The right ventricular size is normal. No increase in right ventricular wall thickness. 5. Left atrial size was mildly dilated. 6. Right atrial size was mild-moderately dilated. 7. Small pericardial effusion. 8. The mitral valve is normal in structure. Trace  mitral valve regurgitation. 9. The tricuspid valve is normal in structure. Tricuspid valve regurgitation is trivial. 10. The aortic valve is tricuspid. Aortic valve regurgitation is not visualized. No evidence of aortic valve sclerosis or stenosis. 11. The pulmonic valve was grossly normal. Pulmonic valve regurgitation is not visualized. 12. The inferior vena cava is normal in size with greater than 50% respiratory variability, suggesting right atrial pressure of 3 mmHg.  cardiac catheterization in 12/2017  - mild non-obstructive disease in the proximal and mid LAD - moderate, heavily calcified proximal circumflex stenosis (did not appear flow limiting) "This lesion would be difficult to approach with PCI given the heavy calcification and the involvement of the intermediate branch which has a stent that extends back to the Circumflex" - intermediate branch has a proximal stent that is patent with mild to moderate stent restenosis - RCA moderate caliber co-dominant vessel with mild disease - inferior wall hypokinesis.  Patient Profile     68 y.o. female with a hx of ESRD on HD TTS, OSA, CAD s/p stent ramus intermedius, HLD, DM, HTN, pulmonary embolism April 2020, obesity, and bipolar disorderseen for chest pain during HD, brief syncope after SL NTG, NSVT on monitor.  Assessment & Plan    1. CAD: No evidence of acute coronary syndrome by cardiac enzymes and ECG, although abnormal is unchanged from previous tracings.  Coronary angiography a year ago showed a moderate ostial left circumflex stenosis with challenging anatomy for PCI.  Medical therapy is still recommended.  Antiplatelet therapy is indicated, even though she is on anticoagulants as well.  Continue aspirin.    Did well yesterday with hemodialysis while receiving all 3 antianginal medications (metoprolol, amlodipine, isosorbide mononitrate).  Not sure about safety of using Ranexa in ESRD and would avoid it. 2. NSVT: Brief episodes of  VT were seen yesterday evening, clearly not artifact, seen on 04/15/2019.  No additional ventricular arrhythmia other than scattered PVCs seen in the last 48 hours.    The relationship between the arrhythmia and her episode of syncope is very much uncertain.  It is much more likely she had syncope due to 3 consecutive nitroglycerin tablets following hemodialysis.  Continue beta-blockers (the current dose is probably maximal tolerated due to relative bradycardia).  Avoid unscheduled interruptions and beta-blocker therapy due to risk of arrhythmia/hypertension/angina rebound.  Amiodarone has been stopped.  Recommend extended outpatient event monitor (14 days). 3. Recent PE: Has had a pulmonary embolism within the last 12 months and is on Eliquis.  Dose adjusted for recurrent bleeding problems (History of GI bleeding, bleeding around dialysis access site).     [facility] will sign  off.   Medication Recommendations: Amlodipine 5 mg once daily, Toprol 75 mg twice daily, isosorbide mononitrate 60 mg once daily, atorvastatin 20 mg once daily, apixaban 2.5 mg twice daily, aspirin 81 mg daily. Other recommendations (labs, testing, etc): Monitor CBC periodically with hemodialysis.  14-day event monitor for ventricular arrhythmia after discharge. Follow up as an outpatient: We will arrange follow-up in 1 month, after she completes her event monitor.  For questions or updates, please contact [place] Please consult [email or web address] for contact info under        Signed, Sanda Klein, MD  04/17/2019, 10:44 AM

## 2019-04-17 NOTE — Discharge Summary (Addendum)
[facility] Discharge Summary  [name] Medical record number: [id number] Date of birth: 11-06-1950 Age: 68 y.o. Gender: female Date of Admission: 04/14/2019  Date of Discharge: 04/17/2019 Admitting Physician: Leeanne Rio, MD  Primary Care Provider: [place] Consultants: Cardiology, Nephrology  Indication for Hospitalization: Syncope following HD, concern for arythmia causing Syncope  Discharge Diagnoses/Problem List:  Patient Active Problem List   Diagnosis Date Noted  . Orthostatic dizziness 04/15/2019  . Bradycardia 04/15/2019  . Syncope   . ESRD on hemodialysis ([place])   . Chest pain at rest 09/16/2018  . Diabetic Charct's arthropathy ([place]) 10/07/2016  . GERD (gastroesophageal reflux disease) 09/09/2016  . Depression 09/09/2016  . Chronic diastolic (congestive) heart failure ([place]) 09/09/2016  . Elevated troponin 09/09/2016  . Hyperlipidemia associated with type 2 diabetes mellitus ([place]) 03/26/2015  . Charcot ankle 03/16/2015  . Anemia, chronic renal failure   . NSVT (nonsustained ventricular tachycardia) ([place])   . Hypertension due to end stage renal disease caused by type 2 diabetes mellitus, on dialysis ([place]) 12/09/2014  . Bipolar affective disorder ([place]) 12/09/2014  . Diabetes mellitus type 2, insulin dependent ([place]) 11/17/2014  . CAD (coronary artery disease), native coronary artery with 2 stents  11/17/2014    Disposition: Discharge home with [facility] PT  Discharge Condition: stable, improved   Discharge Exam:  General: No apparent distress, nontoxic appearing Cardiac: Irregular rhythm, rate about 60 S1-S2 appreciated, no murmurs auscultated Respiratory: CTA bilaterally, normal work of breathing on RA Abdomen: soft, nondistended, normal bowel sounds appreciated Lower Extremities: 1+ nonpitting edema appreciated to Left ankle and foot.   Brief Hospital Course:  She was admitted to the hospital after  having chest pain during dialysis and a syncopal event following the administration 3 doses of nitroglycerin for slight chest pain.  She was also found to have nonsustained ventral tachycardia both by EMS and in the hospital.  Patient was started on an amiodarone drip which was discontinued after troponins trended flat.  Echocardiogram was obtained which showed normal ejection fraction at 50 to 40%, grade 2 diastolic dysfunction, moderate LVH.  Cardiology was consulted and recommended an event recorder for 14 days and follow-up in 1 month, other medication recommendations are listed below.  Due to patient having difficulty caring for multiple chronic diseases as well as her daughter expressing concern over caring for her mother, recommendation for palliative care consult was made.  Unfortunately Palliative care was unable to see patient over the weekend, patient can be referred to them outpatient for assistance.  Physical therapy recommended home health physical therapy, which was arranged before patient was discharged.  Issues for Follow Up:  1. CAD: Coronary artery angiography a year ago showed moderate ostial left circumflex stenosis with challenging anatomy for PCI medical therapy still recommended antiplatelet therapy is indicated though she is on anticoagulants as well recommend to continue aspirin 2. NSVT: Continue Toprol 75 mg this is probably maximal tolerated dose due to relative bradycardia.  Recommend avoiding unscheduled interruptions and beta-blocker therapy due to risk of arrhythmia/hypertension/angina rebound.  Recommend 14-day event monitor for ventricular arrhythmia after discharge, to be arranged by cardiology.  Follow-up in 1 month after she completes the event monitor. 3. Macrocytic anemia: Monitor CBC periodically with hemodialysis 4. ESRD: Do not recommend using Ranexa 5. Other Medication Recommendations: Amlodipine 5 mg once daily, Toprol 75 mg twice daily, isosorbide mononitrate 60 mg  once daily, atorvastatin 20 mg once daily, apixaban 2.5 mg twice  daily, aspirin 81 mg daily.  Significant Procedures: none  Significant Labs and Imaging:  Recent Labs  Lab 04/15/19 0413 04/16/19 0239 04/17/19 0218  WBC 5.3 5.5 5.1  HGB 11.8* 10.3* 10.6*  HCT 36.0 31.8* 32.5*  PLT 170 150 148*   Recent Labs  Lab 04/14/19 1124 04/14/19 1130 04/14/19 1136 04/15/19 0413 04/16/19 0239 04/17/19 0218  NA 139 137 135 136 135 135  K 4.4 4.3 5.1 4.6 4.4 3.8  CL 94* 96* 97* 92* 93* 95*  CO2 29  --   --  28 25 28   GLUCOSE 168* 151* 150* 351* 211* 100*  BUN 24* 38* 41* 39* 52* 24*  CREATININE 4.88* 5.20* 5.00* 7.02* 8.70* 5.10*  CALCIUM 8.9  --   --  9.2 9.0 8.7*  MG 2.1  --   --   --   --   --   PHOS  --   --   --  7.9*  --  4.0  ALBUMIN  --   --   --  3.3*  --  3.0*    Results/Tests Pending at Time of Discharge: none  Discharge Medications:  Allergies as of 04/17/2019   No Known Allergies     Medication List    TAKE these medications   acetaminophen 325 MG tablet Commonly known as: TYLENOL Take 325-975 mg by mouth every 6 (six) hours as needed for mild pain or headache.   albuterol 108 (90 Base) MCG/ACT inhaler Commonly known as: VENTOLIN HFA Inhale 1-2 puffs into the lungs every 6 (six) hours as needed for wheezing or shortness of breath.   amLODipine 5 MG tablet Commonly known as: NORVASC Take 1 tablet (5 mg total) by mouth daily.   ammonium lactate 12 % lotion Commonly known as: LAC-HYDRIN Apply 1 application topically 2 (two) times daily as needed (for itching/irritation).   ARIPiprazole 15 MG tablet Commonly known as: ABILIFY Take 7.5 mg by mouth daily.   aspirin 81 MG chewable tablet Chew 1 tablet (81 mg total) by mouth daily.   atorvastatin 40 MG tablet Commonly known as: LIPITOR Take 20 mg by mouth at bedtime.   Auryxia 1 GM 210 MG(Fe) tablet Generic drug: ferric citrate Take 210 mg by mouth daily.   dextrose 40 % Gel Commonly known as:  GLUTOSE Take 1 Tube by mouth once as needed (for blood sugar less than 60).   Eliquis 5 MG Tabs tablet Generic drug: apixaban Take 2.5 mg by mouth 2 (two) times daily.   gabapentin 100 MG capsule Commonly known as: NEURONTIN Take 100 mg by mouth 2 (two) times daily. Take 200mg   (two capsule) in the morning and 100mg  (1 capsules) at night   insulin detemir 100 UNIT/ML injection Commonly known as: LEVEMIR Inject 0.08 mLs (8 Units total) into the skin at bedtime. What changed:   how much to take  when to take this   insulin lispro 100 UNIT/ML injection Commonly known as: HUMALOG Inject 3-15 Units into the skin 3 (three) times daily before meals. Pt uses as needed per sliding scale:    150-200:  3  units 201-250:  5 units 251-300:  8 units 301-350:  10 units 351-400:  12 units and call MD 400+ 15 units   isosorbide mononitrate 60 MG 24 hr tablet Commonly known as: IMDUR Take 60 mg by mouth daily.   Melatonin 5 MG Tabs Take 10 mg by mouth at bedtime.   Metoprolol Tartrate 75 MG Tabs Take 75 mg  by mouth 2 (two) times daily. What changed: medication strength   multivitamin Tabs tablet Take 1 tablet by mouth at bedtime.   nitroGLYCERIN 0.4 MG SL tablet Commonly known as: NITROSTAT Place 0.4 mg under the tongue every 5 (five) minutes as needed for chest pain.   pantoprazole 20 MG tablet Commonly known as: PROTONIX Take 20 mg by mouth 2 (two) times daily.       Discharge Instructions: Please refer to Patient Instructions section of EMR for full details.  Patient was counseled important signs and symptoms that should prompt return to medical care, changes in medications, dietary instructions, activity restrictions, and follow up appointments.   Follow-Up Appointments: Follow-up Information    [facility]-Home Follow up.   Specialty: [place] Why: [phone]          [name], MD 04/17/2019, 10:46 PM PGY-1, [place] Upper-Level Resident Addendum I have independently interviewed and examined the patient. I have discussed the above with the original author and agree with their documentation. My edits for correction/addition/clarification are in blue. Please see also any attending notes.    Milus Banister, DO PGY-2, [facility] 04/18/2019 12:18 PM  [place] Service pager: [phone] (text pages welcome through [facility])

## 2019-04-17 NOTE — Progress Notes (Signed)
Subjective:  HD late yest- removed 1500-  Tolerated well- is above her EDW according to weights - she denies SOB or CP and wants to go home   Objective Vital signs in last 24 hours: Vitals:   04/16/19 1955 04/16/19 2204 04/17/19 0300 04/17/19 0457  BP: (!) 135/57 (!) 150/64  (!) 158/71  Pulse: 60 64  66  Resp: 16  19 15   Temp: 98.3 F (36.8 C)   (!) 97.5 F (36.4 C)  TempSrc: Oral   Oral  SpO2: 95%  95% 90%  Weight:   74.1 kg   Height:       Weight change: 1.783 kg  Intake/Output Summary (Last 24 hours) at 04/17/2019 0919 Last data filed at 04/16/2019 1713 Gross per 24 hour  Intake -  Output 1500 ml  Net -1500 ml    Dialysis Orders:  TTS at [facility] 3:30hr, 400/A1.5, EDW 72kg, 2K/2Ca, UFP #1, AVF, no heparin - Calcitriol 0.29mcg PO q HD, no ESA  Assessment/Plan: 1.  Chest pain: Trop flat. Short VT runs on admit - amiodarone started. Cardiology following. repeat echo shows actually better EF- they felt that VT was artifact.   not sure if any work up is planned 2.  ESRD:  - continue TTS schedule. Next week would be holiday sched-  M/W/Sat here or at OP clinic-  She is aware- so if she is to go , will go to her OP unit tomorrow  3.  Hypertension/volume: BP controlled - looks like her EDW could probably be raised 0.5-1kg to see if helps.  CXR without pulm edema. Will make change to weight at discharge 4.  Anemia: Hgb 11.8---10.6 - no ESA needed for now. 5.  Metabolic bone disease: Ca ok, Phos ^ - resumed home binder Lorin Picket). Not on VDRA. 6. CAD 7. T2DM: Glu ^ today - per primary. 8.  Hx PE: On Eliquis. 9. Dispo-  Renal service would be fine with discharge today if that is what is decided     [place]gie: Basic Metabolic Panel: Recent Labs  Lab 04/15/19 0413 04/16/19 0239 04/17/19 0218  NA 136 135 135  K 4.6 4.4 3.8  CL 92* 93* 95*  CO2 28 25 28   GLUCOSE 351* 211* 100*  BUN 39* 52* 24*  CREATININE 7.02* 8.70* 5.10*  CALCIUM 9.2 9.0 8.7*  PHOS  7.9*  --  4.0   Liver Function Tests: Recent Labs  Lab 04/15/19 0413 04/17/19 0218  ALBUMIN 3.3* 3.0*   No results for input(s): LIPASE, AMYLASE in the last 168 hours. No results for input(s): AMMONIA in the last 168 hours. CBC: Recent Labs  Lab 04/14/19 1124  04/15/19 0413 04/16/19 0239 04/17/19 0218  WBC 7.0  --  5.3 5.5 5.1  NEUTROABS 5.1  --  3.4  --  3.6  HGB 12.2   < > 11.8* 10.3* 10.6*  HCT 37.1   < > 36.0 31.8* 32.5*  MCV 101.6*  --  102.3* 101.3* 100.9*  PLT 175  --  170 150 148*   < > = values in this interval not displayed.   Cardiac Enzymes: No results for input(s): CKTOTAL, CKMB, CKMBINDEX, TROPONINI in the last 168 hours. CBG: Recent Labs  Lab 04/16/19 1104 04/16/19 1827 04/16/19 1844 04/16/19 2152 04/17/19 0613  GLUCAP 318* 68* 99 228* 85    Iron Studies: No results for input(s): IRON, TIBC, TRANSFERRIN, FERRITIN in the last 72 hours. Studies/Results: No results found. Medications: Infusions:  Scheduled Medications: . amLODipine  5 mg Oral Daily  . apixaban  2.5 mg Oral BID  . ARIPiprazole  7.5 mg Oral Daily  . aspirin EC  81 mg Oral Daily  . atorvastatin  20 mg Oral QHS  . calcium carbonate  1 tablet Oral Once  . Chlorhexidine Gluconate Cloth  6 each Topical Q0600  . ferric citrate  420 mg Oral TID WC  . insulin aspart  0-5 Units Subcutaneous QHS  . insulin aspart  0-6 Units Subcutaneous TID WC  . insulin detemir  10 Units Subcutaneous BID  . isosorbide mononitrate  60 mg Oral Daily  . metoprolol tartrate  75 mg Oral BID  . pantoprazole  20 mg Oral BID    have reviewed scheduled and prn medications.  Physical Exam: General:   No c/o's - asking about going home  Heart: RRR Lungs: mostly clear Abdomen: soft, non tender Extremities: min edema Dialysis Access: left AVF- patent     04/17/2019,9:19 AM  LOS: 3 days

## 2019-04-17 NOTE — Progress Notes (Signed)
Palliative note:   Thank you for this consult.  Consult received and chart reviewed. Noted patient is stable for discharge today.  Recommend outpatient Palliative consult as PMT will likely not be able to complete consult prior to patient's discharge.    Mariana Kaufman, AGNP-C Palliative Medicine  Please call Palliative Medicine team phone with any questions [phone]. For individual providers please see AMION.

## 2019-04-17 NOTE — Progress Notes (Signed)
Family Medicine Teaching Service Daily Progress Note Intern Pager: [phone]  [name] Medical record number: [id number] Date of birth: [birth year] Age: 68 y.o. Gender: female  Primary Care Provider: [facility] Consultants: Cardiology Code Status: Full  Pt Overview and Major Events to Date:  04/14/2019: admitted for NSVT, CP, syncope  Assessment and Plan: [name] Pooleis a 68 y.o.femalepresenting with chest pain, syncopal episode, V. tach. PMH is significant forESRD TTS, CAD (cath in August 2019 with diffuse stenosis), HTN, bipolar affective disorder, DVT, DM, HLD.  VTach  Syncope chest pain No CP today. Echo revealed LVEF low normal 50-55%, G2DD, moderate LVH, IVC w/ >50% variability during respiration indicating a R atrial pressure of 9mmHg (pulmonary htn, likely d/t h/o PE). Syncope due to standing s/p nitroglycerin administration. Daughter having trouble caring for mother, no safe disposition plan at this point. Will request PT/OT eval to assist with disposition. Discussed long term management of multiple chronic health problems and deconditioning with patient and she is interested in speaking with palliative care, will consult. -Continuous cardiac monitoring -Cardiology following - appreciate recs: Medication Recommendations: Amlodipine 5 mg once daily, Toprol 75 mg twice daily, isosorbide mononitrate 60 mg once daily, atorvastatin 20 mg once daily, apixaban 2.5 mg twice daily, aspirin 81 mg daily. Other recommendations (labs, testing, etc): Monitor CBC periodically with hemodialysis.  14-day event monitor for ventricular arrhythmia after discharge. Follow up as an outpatient: We will arrange follow-up in 1 month, after she completes her event monitor. -Consult palliative care -Patient seen at [facility], ROI sent, cardiology received records. -ASA 81 mg daily -Nitroglycerin sl 0.4 mg PRN -PT/OT eval and treat  QTc prolongation QTc prolonged  yesterday to 513 from borderline 494. Today QTc is 530s, however calculated only measures 413msec.  -Avoid QT prolonging medications  H/O CAD Catheterization in 8/2019that showed mild nonobstructive disease in the proximal and mid LAD, moderate, heavily calcified proximal circumflex stenosis, RCA moderate caliber codominant vessel with mild disease, and inferior wall hypokinesis. See above for echo results. - Cardiology does not recommend right heart cath - Continue home medications  HTN BP in ED noted to be 169/69. Home medications include Imdur 60mg daily,metoprolol 50 mg twice daily, amlodipine 5 mg daily. -Continue home medications  Neck and back pain, stable Likely musculoskeletal. No complaints today.  -Tylenol 650mg  q 6 hours prn.  H/O DVT/PE PE in April 2019.Patient was on warfarin for RA left lower extremity DVT. Home meds include Eliquis2.5mg  BID. Likely the cause for pulmonary HTN seen on echo. -Continue home meds  HLD Home medication:Lipitor 40 mg -Continue Lipitor   History of anemia Hemoglobin on admission. Patient goes to [name]. Home medication Auryxia 210mg  daily. - Continue home medications  ESRD on HD Patient is on TTS schedule. She completed dialysis Saturday 11/21. Home meds include tums.  -Continue tums -HD today on TTS schedule  DM Home medication includes Levemir10units twice daily. -sSSI -Continue home Levemir  Diabetic neuropathy Patient takes gabapentin:200 mg in the morning and 100 mg at night. -Continue home medication  Bipolar disorder Home meds include Abilify. -Continue home meds  Tobacco Abuse Current smoker.  -Nicotine patch available if patient wishes  FEN/GI: renal and heart healthy diet PPx: eliquis  Disposition: to telemetry pending disposition per PT/OT  Subjective:  Patient seen sitting upright in bed today, states she's ready to go home. No other concerns or complaints.  Objective: Temp:   [97.5 F (36.4 C)-98.3 F (36.8 C)] 97.5 F (36.4  C) (11/22 0457) Pulse Rate:  [58-71] 66 (11/22 0457) Resp:  [13-19] 15 (11/22 0457) BP: (117-158)/(52-79) 158/71 (11/22 0457) SpO2:  [90 %-100 %] 90 % (11/22 0457) Weight:  [74.1 kg-76.4 kg] 74.1 kg (11/22 0300) Physical Exam: General: No apparent distress, nontoxic appearing Cardiovascular: Irregular rhythm, rate about 60 S1-S2 appreciated, no murmurs auscultated Respiratory: CTA bilaterally, normal work of breathing Abdomen: Soft, nontender, normal bowel sounds appreciated Extremities: 1+ nonpitting edema appreciated to left lower extremity, no edema in right lower extremity; 2+ DP pulses appreciated bilaterally, no deformity or ecchymosis  Laboratory: Recent Labs  Lab 04/15/19 0413 04/16/19 0239 04/17/19 0218  WBC 5.3 5.5 5.1  HGB 11.8* 10.3* 10.6*  HCT 36.0 31.8* 32.5*  PLT 170 150 148*   Recent Labs  Lab 04/15/19 0413 04/16/19 0239 04/17/19 0218  NA 136 135 135  K 4.6 4.4 3.8  CL 92* 93* 95*  CO2 28 25 28   BUN 39* 52* 24*  CREATININE 7.02* 8.70* 5.10*  CALCIUM 9.2 9.0 8.7*  GLUCOSE 351* 211* 100*   Imaging/Diagnostic Tests: Dg Chest Port 1 View  Result Date: 04/14/2019 CLINICAL DATA:  Chest pain during dialysis. EXAM: PORTABLE CHEST 1 VIEW COMPARISON:  09/16/2018 FINDINGS: Cardiomediastinal contours remain markedly enlarged. The pacer defibrillator pads and leads project over the chest on the left. No signs of consolidation or pleural effusion. No frank pulmonary edema. No acute bone finding. IMPRESSION: Cardiomegaly without acute cardiopulmonary disease. Electronically Signed   By: Zetta Bills M.D.   On: 04/14/2019 12:04     Daisy Floro, DO 04/17/2019, 9:45 AM PGY-2, [place] Intern pager: [phone], text pages welcome

## 2019-04-17 NOTE — TOC Transition Note (Addendum)
Transition of Care [facility]) - CM/SW Discharge Note   Patient Details  Name: Robin [name] MRN: [id number] Date of Birth: [birth year]  Transition of Care [facility]) CM/SW Contact:  Claudie Leach, RN Phone Number: [phone] 04/17/2019, 4:09 PM   Clinical Narrative:    Patient to dc home.  Patient chooses [facility] to provide HHPT.  Patient states she has a walker and does not need DME.  Offered to arrange outpatient palliative services. Patient declines and understands she can discuss further with her doctor.    PCP is Marsh Dolly at [facility].  Final next level of care: [place] Barriers to Discharge: No Barriers Identified   Patient Goals and CMS Choice Patient states their goals for this hospitalization and ongoing recovery are:: to go home CMS Medicare.gov Compare Post Acute Care list provided to:: Patient Choice offered to / list presented to : Patient  Discharge Plan and Services      HH Arranged: PT [facility] Agency: [place] ([facility]) Date [place]: 04/17/19 Time [place]: [id number] Representative spoke with at [place]: Corene Cornea    Readmission Risk Interventions Readmission Risk Prevention Plan 04/17/2019  Transportation Screening Complete  PCP or Specialist Appt within 3-5 Days Complete  HRI or [place] Complete  Social Work Consult for [place] Planning/Counseling Not Complete  SW consult not completed comments NA  Palliative Care Screening Complete  Medication Review Press photographer) Referral to Pharmacy  Some recent data might be hidden

## 2019-04-19 DIAGNOSIS — E785 Hyperlipidemia, unspecified: Secondary | ICD-10-CM | POA: Diagnosis not present

## 2019-04-19 DIAGNOSIS — I132 Hypertensive heart and chronic kidney disease with heart failure and with stage 5 chronic kidney disease, or end stage renal disease: Secondary | ICD-10-CM | POA: Diagnosis not present

## 2019-04-19 DIAGNOSIS — M1990 Unspecified osteoarthritis, unspecified site: Secondary | ICD-10-CM | POA: Diagnosis not present

## 2019-04-19 DIAGNOSIS — F419 Anxiety disorder, unspecified: Secondary | ICD-10-CM | POA: Diagnosis not present

## 2019-04-19 DIAGNOSIS — E11610 Type 2 diabetes mellitus with diabetic neuropathic arthropathy: Secondary | ICD-10-CM | POA: Diagnosis not present

## 2019-04-19 DIAGNOSIS — J9611 Chronic respiratory failure with hypoxia: Secondary | ICD-10-CM | POA: Diagnosis not present

## 2019-04-19 DIAGNOSIS — F319 Bipolar disorder, unspecified: Secondary | ICD-10-CM | POA: Diagnosis not present

## 2019-04-19 DIAGNOSIS — Z7982 Long term (current) use of aspirin: Secondary | ICD-10-CM | POA: Diagnosis not present

## 2019-04-19 DIAGNOSIS — Z6827 Body mass index (BMI) 27.0-27.9, adult: Secondary | ICD-10-CM | POA: Diagnosis not present

## 2019-04-19 DIAGNOSIS — Z9181 History of falling: Secondary | ICD-10-CM | POA: Diagnosis not present

## 2019-04-19 DIAGNOSIS — Z992 Dependence on renal dialysis: Secondary | ICD-10-CM | POA: Diagnosis not present

## 2019-04-19 DIAGNOSIS — I472 Ventricular tachycardia: Secondary | ICD-10-CM | POA: Diagnosis not present

## 2019-04-19 DIAGNOSIS — E1122 Type 2 diabetes mellitus with diabetic chronic kidney disease: Secondary | ICD-10-CM | POA: Diagnosis not present

## 2019-04-19 DIAGNOSIS — I25118 Atherosclerotic heart disease of native coronary artery with other forms of angina pectoris: Secondary | ICD-10-CM | POA: Diagnosis not present

## 2019-04-19 DIAGNOSIS — M542 Cervicalgia: Secondary | ICD-10-CM | POA: Diagnosis not present

## 2019-04-19 DIAGNOSIS — Z794 Long term (current) use of insulin: Secondary | ICD-10-CM | POA: Diagnosis not present

## 2019-04-19 DIAGNOSIS — I5042 Chronic combined systolic (congestive) and diastolic (congestive) heart failure: Secondary | ICD-10-CM | POA: Diagnosis not present

## 2019-04-19 DIAGNOSIS — G4733 Obstructive sleep apnea (adult) (pediatric): Secondary | ICD-10-CM | POA: Diagnosis not present

## 2019-04-19 DIAGNOSIS — F17210 Nicotine dependence, cigarettes, uncomplicated: Secondary | ICD-10-CM | POA: Diagnosis not present

## 2019-04-19 DIAGNOSIS — N186 End stage renal disease: Secondary | ICD-10-CM | POA: Diagnosis not present

## 2019-04-19 DIAGNOSIS — D631 Anemia in chronic kidney disease: Secondary | ICD-10-CM | POA: Diagnosis not present

## 2019-04-19 DIAGNOSIS — E669 Obesity, unspecified: Secondary | ICD-10-CM | POA: Diagnosis not present

## 2019-04-19 DIAGNOSIS — M549 Dorsalgia, unspecified: Secondary | ICD-10-CM | POA: Diagnosis not present

## 2019-04-19 DIAGNOSIS — E1140 Type 2 diabetes mellitus with diabetic neuropathy, unspecified: Secondary | ICD-10-CM | POA: Diagnosis not present

## 2019-04-19 DIAGNOSIS — K219 Gastro-esophageal reflux disease without esophagitis: Secondary | ICD-10-CM | POA: Diagnosis not present

## 2019-04-27 DIAGNOSIS — E1122 Type 2 diabetes mellitus with diabetic chronic kidney disease: Secondary | ICD-10-CM | POA: Diagnosis not present

## 2019-04-27 DIAGNOSIS — N186 End stage renal disease: Secondary | ICD-10-CM | POA: Diagnosis not present

## 2019-04-27 DIAGNOSIS — I25118 Atherosclerotic heart disease of native coronary artery with other forms of angina pectoris: Secondary | ICD-10-CM | POA: Diagnosis not present

## 2019-04-27 DIAGNOSIS — I132 Hypertensive heart and chronic kidney disease with heart failure and with stage 5 chronic kidney disease, or end stage renal disease: Secondary | ICD-10-CM | POA: Diagnosis not present

## 2019-04-27 DIAGNOSIS — I5042 Chronic combined systolic (congestive) and diastolic (congestive) heart failure: Secondary | ICD-10-CM | POA: Diagnosis not present

## 2019-04-27 DIAGNOSIS — D631 Anemia in chronic kidney disease: Secondary | ICD-10-CM | POA: Diagnosis not present

## 2019-05-03 ENCOUNTER — Telehealth: Payer: Self-pay | Admitting: *Deleted

## 2019-05-03 NOTE — Telephone Encounter (Signed)
[employer] to ship a 14 day cardiac event monitor to the patients home.  Instructions included in the monitor kit. 

## 2019-05-04 DIAGNOSIS — I132 Hypertensive heart and chronic kidney disease with heart failure and with stage 5 chronic kidney disease, or end stage renal disease: Secondary | ICD-10-CM | POA: Diagnosis not present

## 2019-05-04 DIAGNOSIS — E1122 Type 2 diabetes mellitus with diabetic chronic kidney disease: Secondary | ICD-10-CM | POA: Diagnosis not present

## 2019-05-04 DIAGNOSIS — D631 Anemia in chronic kidney disease: Secondary | ICD-10-CM | POA: Diagnosis not present

## 2019-05-04 DIAGNOSIS — I5042 Chronic combined systolic (congestive) and diastolic (congestive) heart failure: Secondary | ICD-10-CM | POA: Diagnosis not present

## 2019-05-04 DIAGNOSIS — N186 End stage renal disease: Secondary | ICD-10-CM | POA: Diagnosis not present

## 2019-05-04 DIAGNOSIS — I25118 Atherosclerotic heart disease of native coronary artery with other forms of angina pectoris: Secondary | ICD-10-CM | POA: Diagnosis not present

## 2019-05-11 DIAGNOSIS — I5042 Chronic combined systolic (congestive) and diastolic (congestive) heart failure: Secondary | ICD-10-CM | POA: Diagnosis not present

## 2019-05-11 DIAGNOSIS — E1122 Type 2 diabetes mellitus with diabetic chronic kidney disease: Secondary | ICD-10-CM | POA: Diagnosis not present

## 2019-05-11 DIAGNOSIS — D631 Anemia in chronic kidney disease: Secondary | ICD-10-CM | POA: Diagnosis not present

## 2019-05-11 DIAGNOSIS — I132 Hypertensive heart and chronic kidney disease with heart failure and with stage 5 chronic kidney disease, or end stage renal disease: Secondary | ICD-10-CM | POA: Diagnosis not present

## 2019-05-11 DIAGNOSIS — N186 End stage renal disease: Secondary | ICD-10-CM | POA: Diagnosis not present

## 2019-05-11 DIAGNOSIS — I25118 Atherosclerotic heart disease of native coronary artery with other forms of angina pectoris: Secondary | ICD-10-CM | POA: Diagnosis not present

## 2019-05-18 DIAGNOSIS — I5042 Chronic combined systolic (congestive) and diastolic (congestive) heart failure: Secondary | ICD-10-CM | POA: Diagnosis not present

## 2019-05-18 DIAGNOSIS — N186 End stage renal disease: Secondary | ICD-10-CM | POA: Diagnosis not present

## 2019-05-18 DIAGNOSIS — I25118 Atherosclerotic heart disease of native coronary artery with other forms of angina pectoris: Secondary | ICD-10-CM | POA: Diagnosis not present

## 2019-05-18 DIAGNOSIS — D631 Anemia in chronic kidney disease: Secondary | ICD-10-CM | POA: Diagnosis not present

## 2019-05-18 DIAGNOSIS — E1122 Type 2 diabetes mellitus with diabetic chronic kidney disease: Secondary | ICD-10-CM | POA: Diagnosis not present

## 2019-05-18 DIAGNOSIS — I132 Hypertensive heart and chronic kidney disease with heart failure and with stage 5 chronic kidney disease, or end stage renal disease: Secondary | ICD-10-CM | POA: Diagnosis not present

## 2019-05-25 ENCOUNTER — Encounter: Payer: Self-pay | Admitting: Family

## 2019-05-25 ENCOUNTER — Other Ambulatory Visit: Payer: Self-pay

## 2019-05-25 ENCOUNTER — Ambulatory Visit (INDEPENDENT_AMBULATORY_CARE_PROVIDER_SITE_OTHER): Payer: Medicare Other | Admitting: Family

## 2019-05-25 VITALS — Ht 64.0 in | Wt 163.0 lb

## 2019-05-25 DIAGNOSIS — E11610 Type 2 diabetes mellitus with diabetic neuropathic arthropathy: Secondary | ICD-10-CM | POA: Diagnosis not present

## 2019-05-25 DIAGNOSIS — G6289 Other specified polyneuropathies: Secondary | ICD-10-CM | POA: Diagnosis not present

## 2019-05-25 DIAGNOSIS — I200 Unstable angina: Secondary | ICD-10-CM | POA: Diagnosis not present

## 2019-05-25 DIAGNOSIS — B351 Tinea unguium: Secondary | ICD-10-CM | POA: Diagnosis not present

## 2019-05-25 NOTE — Progress Notes (Signed)
Office Visit Note   Patient: Robin [name]           Date of Birth: [birth year]           MRN: [id number] Visit Date: 05/25/2019              Requested by: Center, [facility] [address] [place],  [place] [address] PCP: [place]  Chief Complaint  Patient presents with  . Right Foot - Follow-up  . Left Foot - Follow-up      HPI: The patient is a 68 year old woman who presents today in routine follow up for bilateral foot evaluation. She requests a nail trim.   Complaining of numbness and tingling sensations in her feet.  States this is the dorsal as well as plantar aspect of her feet this sometimes comes on and last several days and intermittently is relieved.  Cannot recall any aggravating or relieving factors.  Does have a history of diabetes.  With this she has no associated difficulty walking difficulty knowing where her feet are in space.  Assessment & Plan: Visit Diagnoses:  1. Onychomycosis   2. Charcot foot due to diabetes mellitus ([place])   3. Other polyneuropathy     Plan: Nails trimmed 10.  Discussed her peripheral neuropathy at length.  Discussed tight blood glucose control.  She is already taking gabapentin.  She'll follow-up in office in 3 months, sooner should she have any concerns in the meantime.  Follow-Up Instructions: Return in about 3 months (around 08/23/2019).   Ortho Exam  Patient is alert, oriented, no adenopathy, well-dressed, normal affect, normal respiratory effort. Well-healed surgical scars to the right foot. Thickened and discolored onychomycotic nails 10. She is unable to safely trim her own nails. Nails 10 without incident.   Imaging: No results found. No images are attached to the encounter.  Labs: Lab Results  Component Value Date   HGBA1C 10.0 (H) 04/15/2019   HGBA1C 6.7 (H) 09/09/2016   HGBA1C 7.7 08/25/2016   LABURIC 5.4 12/01/2009   LABURIC 5.5 11/30/2009   REPTSTATUS 08/13/2016 FINAL 08/07/2016   GRAMSTAIN   03/14/2016    MODERATE WBC PRESENT,BOTH PMN AND MONONUCLEAR NO ORGANISMS SEEN    CULT  08/07/2016    NO GROWTH 5 DAYS Performed at [facility], [place] [address], [place], [place] [address]     Orders:  No orders of the defined types were placed in this encounter.  No orders of the defined types were placed in this encounter.    Procedures: No procedures performed  Clinical Data: No additional findings.  ROS:  All other systems negative, except as noted in the HPI. Review of Systems  Constitutional: Negative for chills and fever.  Skin: Negative for color change and wound.  Neurological: Negative for weakness.    Objective: Vital Signs: Ht 5\' 4"  (1.626 m)   Wt 163 lb (73.9 kg)   BMI 27.98 kg/m   Specialty Comments:  No specialty comments available.  PMFS History: Patient Active Problem List   Diagnosis Date Noted  . Orthostatic dizziness 04/15/2019  . Bradycardia 04/15/2019  . Syncope   . ESRD on hemodialysis ([place])   . Chest pain at rest 09/16/2018  . Diabetic Charct's arthropathy ([place]) 10/07/2016  . GERD (gastroesophageal reflux disease) 09/09/2016  . Depression 09/09/2016  . Chronic diastolic (congestive) heart failure ([place]) 09/09/2016  . Elevated troponin 09/09/2016  . Hyperlipidemia associated with type 2 diabetes mellitus ([place]) 03/26/2015  . Charcot  ankle 03/16/2015  . Anemia, chronic renal failure   . NSVT (nonsustained ventricular tachycardia) ([place])   . Hypertension due to end stage renal disease caused by type 2 diabetes mellitus, on dialysis ([place]) 12/09/2014  . Bipolar affective disorder ([place]) 12/09/2014  . Diabetes mellitus type 2, insulin dependent ([place]) 11/17/2014  . CAD (coronary artery disease), native coronary artery with 2 stents  11/17/2014   Past Medical History:  Diagnosis Date  . Acute delirium 11/18/2014  . Acute encephalopathy   . Acute on chronic respiratory failure with hypoxia ([place]) 09/09/2016  . Acute respiratory failure  with hypoxia ([place]) 11/29/2015  . Anemia in chronic kidney disease 12/09/2014  . Anxiety   . Arthritis   . Benign hypertension   . Bipolar affective disorder ([place]) 12/09/2014  . CAD (coronary artery disease), native coronary artery with 2 stents  11/17/2014  . Charcot foot due to diabetes mellitus ([place])   . Chronic combined systolic and diastolic CHF (congestive heart failure) ([place]) 11/18/2014  . Closed left ankle fracture 11/17/2014  . Confusion 01/21/2015  . Depression   . Diabetes mellitus without complication ([place])   . End stage renal disease on dialysis ([place])   . Fracture dislocation of ankle 11/17/2014  . GERD (gastroesophageal reflux disease)   . Heart murmur   . History of blood transfusion   . History of bronchitis   . History of pneumonia   . Hyperlipidemia 03/26/2015  . Hypertension associated with diabetes ([place]) 11/18/2014  . Hypertensive heart/renal disease with failure ([place]) 12/09/2014  . Hypokalemia 11/17/2014  . Multiple falls 01/21/2015  . Obesity 11/17/2014  . Onychomycosis 10/07/2016  . Right leg DVT ([place]) 12/05/2015  . SIRS (systemic inflammatory response syndrome) ([place]) 08/08/2016  . Sleep apnea   . Unstable angina ([place]) 12/26/2017  . Ventricular tachycardia ([place])     Family History  Family history unknown: Yes    Past Surgical History:  Procedure Laterality Date  . ABDOMINAL HYSTERECTOMY    . ANKLE CLOSED REDUCTION N/A 11/17/2014   Procedure: CLOSED REDUCTION ANKLE;  Surgeon: Earlie Server, MD;  Location: [facility];  Service: Orthopedics;  Laterality: N/A;  . ANKLE FUSION Left 03/16/2015   Procedure: Left Tibiocalcaneal Fusion;  Surgeon: Newt Minion, MD;  Location: [facility];  Service: Orthopedics;  Laterality: Left;  . APPLICATION OF WOUND VAC Right 08/06/2016   Procedure: APPLICATION OF PREVENA WOUND VAC;  Surgeon: Newt Minion, MD;  Location: [facility];  Service: Orthopedics;  Laterality: Right;  . AV FISTULA PLACEMENT Left [id number]   done at [facility]     2 stent   . CHOLECYSTECTOMY    . CORONARY STENT PLACEMENT    . FOOT ARTHRODESIS Right 08/06/2016   Procedure: Right Foot Fusion Lisfranc Joint;  Surgeon: Newt Minion, MD;  Location: [facility];  Service: Orthopedics;  Laterality: Right;  . HARDWARE REMOVAL Left 03/16/2015   Procedure: Removal Hardware Left Ankle;  Surgeon: Newt Minion, MD;  Location: [facility];  Service: Orthopedics;  Laterality: Left;  . IR AV DIALY SHUNT INTRO NEEDLE/INTRACATH INITIAL W/PTA/IMG LEFT  01/05/2018  . IR US GUIDE VASC ACCESS LEFT  01/05/2018  . LEFT HEART CATH AND CORONARY ANGIOGRAPHY N/A 12/28/2017   Procedure: LEFT HEART CATH AND CORONARY ANGIOGRAPHY;  Surgeon: Burnell Blanks, MD;  Location: [facility];  Service: Cardiovascular;  Laterality: N/A;  . ORIF ANKLE FRACTURE Left 11/20/2014   Procedure: OPEN REDUCTION INTERNAL FIXATION (ORIF) ANKLE FRACTURE;  Surgeon: Renette Butters, MD;  Location: [facility];  Service: Orthopedics;  Laterality: Left;  . TONSILLECTOMY     Social History   Occupational History  . Not on file  Tobacco Use  . Smoking status: Current Every Day Smoker    Packs/day: 1.00    Types: Cigarettes  . Smokeless tobacco: Never Used  Substance and Sexual Activity  . Alcohol use: No  . Drug use: No  . Sexual activity: Never

## 2019-06-24 ENCOUNTER — Ambulatory Visit (INDEPENDENT_AMBULATORY_CARE_PROVIDER_SITE_OTHER): Payer: Medicare Other | Admitting: Family

## 2019-06-24 ENCOUNTER — Encounter: Payer: Self-pay | Admitting: Family

## 2019-06-24 ENCOUNTER — Other Ambulatory Visit: Payer: Self-pay

## 2019-06-24 DIAGNOSIS — I96 Gangrene, not elsewhere classified: Secondary | ICD-10-CM

## 2019-06-24 MED ORDER — GABAPENTIN 300 MG PO CAPS: 300.0000 mg | ORAL_CAPSULE | Freq: Two times a day (BID) | ORAL | 3 refills | Status: DC

## 2019-06-24 NOTE — Progress Notes (Signed)
Office Visit Note   Patient: Robin [name]           Date of Birth: 1951-01-01           MRN: [id number] Visit Date: 06/24/2019              Requested by: Center, [name] [address] [place],  [place] [address] PCP: [place] [facility]  No chief complaint on file.     HPI: The patient is a 69 year old woman who presents today seen today as a work in for concern of worsening neuropathic pain to her feet complains of burning numbness tingling primarily to the bottoms of her feet.  Her daughter is concerned about her second toenail on the right foot.  Complains of worse pain in this toe.  states this is the dorsal as well as plantar aspect of her feet this sometimes comes on and last several days and intermittently is relieved.  Cannot recall any aggravating or relieving factors.  Does have a history of diabetes.  With this she has no associated difficulty walking difficulty knowing where her feet are in space.  Assessment & Plan: Visit Diagnoses:  No diagnosis found.  Plan: We will refer her for ABIs bilaterally.  Concern for ischemic second toe right foot.  Discussed her peripheral neuropathy.  Will increase her gabapentin.  She'll follow-up in office to review ABI with duda. Follow-Up Instructions: Return to review abi with duda.   Ortho Exam  Patient is alert, oriented, no adenopathy, well-dressed, normal affect, normal respiratory effort. Well-healed surgical scars to the right foot. Thickened and discolored onychomycotic nails 10.  There is no edema or erythema to her feet.  The right second toe has darkened discoloration concern for ischemic toe.  Toes normothermic.  She does have a callused ulceration to the tip of this toe.  I am able to palpate dorsalis pedis pulses bilaterally.  Imaging: No results found. No images are attached to the encounter.  Labs: Lab Results  Component Value Date   HGBA1C 10.0 (H) 04/15/2019   HGBA1C 6.7 (H) 09/09/2016   HGBA1C 7.7  08/25/2016   LABURIC 5.4 12/01/2009   LABURIC 5.5 11/30/2009   REPTSTATUS 08/13/2016 FINAL 08/07/2016   GRAMSTAIN  03/14/2016    MODERATE WBC PRESENT,BOTH PMN AND MONONUCLEAR NO ORGANISMS SEEN    CULT  08/07/2016    NO GROWTH 5 DAYS Performed at [facility], [place] [address] [place], [place] [address]     Orders:  No orders of the defined types were placed in this encounter.  No orders of the defined types were placed in this encounter.    Procedures: No procedures performed  Clinical Data: No additional findings.  ROS:  All other systems negative, except as noted in the HPI. Review of Systems  Constitutional: Negative for chills and fever.  Skin: Negative for color change and wound.  Neurological: Negative for weakness.    Objective: Vital Signs: There were no vitals taken for this visit.  Specialty Comments:  No specialty comments available.  PMFS History: Patient Active Problem List   Diagnosis Date Noted  . Orthostatic dizziness 04/15/2019  . Bradycardia 04/15/2019  . Syncope   . ESRD on hemodialysis ([place])   . Chest pain at rest 09/16/2018  . Diabetic Charct's arthropathy ([place]) 10/07/2016  . GERD (gastroesophageal reflux disease) 09/09/2016  . Depression 09/09/2016  . Chronic diastolic (congestive) heart failure ([place]) 09/09/2016  . Elevated troponin 09/09/2016  . Hyperlipidemia  associated with type 2 diabetes mellitus ([place]) 03/26/2015  . Charcot ankle 03/16/2015  . Anemia, chronic renal failure   . NSVT (nonsustained ventricular tachycardia) ([place])   . Hypertension due to end stage renal disease caused by type 2 diabetes mellitus, on dialysis ([place]) 12/09/2014  . Bipolar affective disorder ([place]) 12/09/2014  . Diabetes mellitus type 2, insulin dependent ([place]) 11/17/2014  . CAD (coronary artery disease), native coronary artery with 2 stents  11/17/2014   Past Medical History:  Diagnosis Date  . Acute delirium 11/18/2014  . Acute encephalopathy    . Acute on chronic respiratory failure with hypoxia ([place]) 09/09/2016  . Acute respiratory failure with hypoxia ([place]) 11/29/2015  . Anemia in chronic kidney disease 12/09/2014  . Anxiety   . Arthritis   . Benign hypertension   . Bipolar affective disorder ([place]) 12/09/2014  . CAD (coronary artery disease), native coronary artery with 2 stents  11/17/2014  . Charcot foot due to diabetes mellitus ([place])   . Chronic combined systolic and diastolic CHF (congestive heart failure) ([place]) 11/18/2014  . Closed left ankle fracture 11/17/2014  . Confusion 01/21/2015  . Depression   . Diabetes mellitus without complication ([place])   . End stage renal disease on dialysis ([place])   . Fracture dislocation of ankle 11/17/2014  . GERD (gastroesophageal reflux disease)   . Heart murmur   . History of blood transfusion   . History of bronchitis   . History of pneumonia   . Hyperlipidemia 03/26/2015  . Hypertension associated with diabetes ([place]) 11/18/2014  . Hypertensive heart/renal disease with failure ([place]) 12/09/2014  . Hypokalemia 11/17/2014  . Multiple falls 01/21/2015  . Obesity 11/17/2014  . Onychomycosis 10/07/2016  . Right leg DVT ([place]) 12/05/2015  . SIRS (systemic inflammatory response syndrome) ([place]) 08/08/2016  . Sleep apnea   . Unstable angina ([place]) 12/26/2017  . Ventricular tachycardia ([place])     Family History  Family history unknown: Yes    Past Surgical History:  Procedure Laterality Date  . ABDOMINAL HYSTERECTOMY    . ANKLE CLOSED REDUCTION N/A 11/17/2014   Procedure: CLOSED REDUCTION ANKLE;  Surgeon: Earlie Server, MD;  Location: [facility];  Service: Orthopedics;  Laterality: N/A;  . ANKLE FUSION Left 03/16/2015   Procedure: Left Tibiocalcaneal Fusion;  Surgeon: Newt Minion, MD;  Location: [facility];  Service: Orthopedics;  Laterality: Left;  . APPLICATION OF WOUND VAC Right 08/06/2016   Procedure: APPLICATION OF PREVENA WOUND VAC;  Surgeon: Newt Minion, MD;  Location: [facility];  Service: Orthopedics;   Laterality: Right;  . AV FISTULA PLACEMENT Left [id number]   done at [facility]     2 stent   . CHOLECYSTECTOMY    . CORONARY STENT PLACEMENT    . FOOT ARTHRODESIS Right 08/06/2016   Procedure: Right Foot Fusion Lisfranc Joint;  Surgeon: Newt Minion, MD;  Location: [facility];  Service: Orthopedics;  Laterality: Right;  . HARDWARE REMOVAL Left 03/16/2015   Procedure: Removal Hardware Left Ankle;  Surgeon: Newt Minion, MD;  Location: [facility];  Service: Orthopedics;  Laterality: Left;  . IR AV DIALY SHUNT INTRO NEEDLE/INTRACATH INITIAL W/PTA/IMG LEFT  01/05/2018  . IR US GUIDE VASC ACCESS LEFT  01/05/2018  . LEFT HEART CATH AND CORONARY ANGIOGRAPHY N/A 12/28/2017   Procedure: LEFT HEART CATH AND CORONARY ANGIOGRAPHY;  Surgeon: Burnell Blanks, MD;  Location: [facility];  Service: Cardiovascular;  Laterality: N/A;  . ORIF ANKLE FRACTURE Left  11/20/2014   Procedure: OPEN REDUCTION INTERNAL FIXATION (ORIF) ANKLE FRACTURE;  Surgeon: Renette Butters, MD;  Location: [facility];  Service: Orthopedics;  Laterality: Left;  . TONSILLECTOMY     Social History   Occupational History  . Not on file  Tobacco Use  . Smoking status: Current Every Day Smoker    Packs/day: 1.00    Types: Cigarettes  . Smokeless tobacco: Never Used  Substance and Sexual Activity  . Alcohol use: No  . Drug use: No  . Sexual activity: Never

## 2019-07-01 ENCOUNTER — Ambulatory Visit (HOSPITAL_COMMUNITY): Admission: RE | Admit: 2019-07-01 | Payer: No Typology Code available for payment source | Source: Ambulatory Visit

## 2019-08-25 NOTE — Telephone Encounter (Signed)
CANCELLED ORDER... Monitor was returned on 05/10/19... No baseline

## 2019-09-20 ENCOUNTER — Encounter: Payer: Self-pay | Admitting: Family

## 2019-09-20 ENCOUNTER — Other Ambulatory Visit: Payer: Self-pay

## 2019-09-20 ENCOUNTER — Ambulatory Visit (INDEPENDENT_AMBULATORY_CARE_PROVIDER_SITE_OTHER): Payer: Medicare Other | Admitting: Family

## 2019-09-20 VITALS — Ht 64.0 in | Wt 163.0 lb

## 2019-09-20 DIAGNOSIS — G6289 Other specified polyneuropathies: Secondary | ICD-10-CM

## 2019-09-20 DIAGNOSIS — B351 Tinea unguium: Secondary | ICD-10-CM

## 2019-09-20 NOTE — Progress Notes (Signed)
Office Visit Note   Patient: Robin [name]           Date of Birth: 10-06-1950           MRN: [id number] Visit Date: 09/20/2019              Requested by: Center, [facility] [address] [place],  [place] [address] PCP: [facility], [place]  Chief Complaint  Patient presents with  . Right Foot - Follow-up  . Left Foot - Follow-up      HPI: The patient is a 69 year old woman who presents today in routine follow-up for bilateral foot evaluation and nail trimming bilateral.  Has been having some worsening edema to bilateral lower extremities she describes this as mild however it does become painful over the course of the day in feet. Some pain to tip of right second toe with ambulation.  Assessment & Plan: Visit Diagnoses:  No diagnosis found.  Plan: Nail trimmed today without incident she will follow-up in 3 months.  Discussed using compression garments bilaterally her provided order for these daily discussed elevation of lower extremities. Stiff walking shoes for clawing of right second toe.  Follow-Up Instructions: Return in about 3 months (around 12/20/2019).   Ortho Exam  Patient is alert, oriented, no adenopathy, well-dressed, normal affect, normal respiratory effort. Well-healed surgical scar to the right foot. Thickened and discolored onychomycotic nails 10.  There is no erythema to her feet.  The right second toe has darkened discoloration, is normothermic, is normal for her.  She does have a callused ulceration to the tip of this toe. Fixed clawing of second toe right foot. Callus and nails trimmed today without incident.  I am able to palpate dorsalis pedis pulses bilaterally.  Imaging: No results found. No images are attached to the encounter.  Labs: Lab Results  Component Value Date   HGBA1C 10.0 (H) 04/15/2019   HGBA1C 6.7 (H) 09/09/2016   HGBA1C 7.7 08/25/2016   LABURIC 5.4 12/01/2009   LABURIC 5.5 11/30/2009   REPTSTATUS 08/13/2016 FINAL 08/07/2016    GRAMSTAIN  03/14/2016    MODERATE WBC PRESENT,BOTH PMN AND MONONUCLEAR NO ORGANISMS SEEN    CULT  08/07/2016    NO GROWTH 5 DAYS Performed at [facility], [place] [address], [place], [place] [address]     Orders:  No orders of the defined types were placed in this encounter.  No orders of the defined types were placed in this encounter.    Procedures: No procedures performed  Clinical Data: No additional findings.  ROS:  All other systems negative, except as noted in the HPI. Review of Systems  Constitutional: Negative for chills and fever.  Skin: Negative for color change and wound.  Neurological: Negative for weakness.    Objective: Vital Signs: Ht 5\' 4"  (1.626 m)   Wt 163 lb (73.9 kg)   BMI 27.98 kg/m   Specialty Comments:  No specialty comments available.  PMFS History: Patient Active Problem List   Diagnosis Date Noted  . Orthostatic dizziness 04/15/2019  . Bradycardia 04/15/2019  . Syncope   . ESRD on hemodialysis ([place])   . Chest pain at rest 09/16/2018  . Diabetic Charct's arthropathy ([place]) 10/07/2016  . GERD (gastroesophageal reflux disease) 09/09/2016  . Depression 09/09/2016  . Chronic diastolic (congestive) heart failure ([place]) 09/09/2016  . Elevated troponin 09/09/2016  . Hyperlipidemia associated with type 2 diabetes mellitus ([place]) 03/26/2015  . Charcot ankle 03/16/2015  . Anemia, chronic  renal failure   . NSVT (nonsustained ventricular tachycardia) ([place])   . Hypertension due to end stage renal disease caused by type 2 diabetes mellitus, on dialysis ([place]) 12/09/2014  . Bipolar affective disorder ([place]) 12/09/2014  . Diabetes mellitus type 2, insulin dependent ([place]) 11/17/2014  . CAD (coronary artery disease), native coronary artery with 2 stents  11/17/2014   Past Medical History:  Diagnosis Date  . Acute delirium 11/18/2014  . Acute encephalopathy   . Acute on chronic respiratory failure with hypoxia ([place]) 09/09/2016  . Acute  respiratory failure with hypoxia ([place]) 11/29/2015  . Anemia in chronic kidney disease 12/09/2014  . Anxiety   . Arthritis   . Benign hypertension   . Bipolar affective disorder ([place]) 12/09/2014  . CAD (coronary artery disease), native coronary artery with 2 stents  11/17/2014  . Charcot foot due to diabetes mellitus ([place])   . Chronic combined systolic and diastolic CHF (congestive heart failure) ([place]) 11/18/2014  . Closed left ankle fracture 11/17/2014  . Confusion 01/21/2015  . Depression   . Diabetes mellitus without complication ([place])   . End stage renal disease on dialysis ([place])   . Fracture dislocation of ankle 11/17/2014  . GERD (gastroesophageal reflux disease)   . Heart murmur   . History of blood transfusion   . History of bronchitis   . History of pneumonia   . Hyperlipidemia 03/26/2015  . Hypertension associated with diabetes ([place]) 11/18/2014  . Hypertensive heart/renal disease with failure ([place]) 12/09/2014  . Hypokalemia 11/17/2014  . Multiple falls 01/21/2015  . Obesity 11/17/2014  . Onychomycosis 10/07/2016  . Right leg DVT ([place]) 12/05/2015  . SIRS (systemic inflammatory response syndrome) ([place]) 08/08/2016  . Sleep apnea   . Unstable angina ([place]) 12/26/2017  . Ventricular tachycardia ([place])     Family History  Family history unknown: Yes    Past Surgical History:  Procedure Laterality Date  . ABDOMINAL HYSTERECTOMY    . ANKLE CLOSED REDUCTION N/A 11/17/2014   Procedure: CLOSED REDUCTION ANKLE;  Surgeon: Earlie Server, MD;  Location: [facility];  Service: Orthopedics;  Laterality: N/A;  . ANKLE FUSION Left 03/16/2015   Procedure: Left Tibiocalcaneal Fusion;  Surgeon: Newt Minion, MD;  Location: [facility];  Service: Orthopedics;  Laterality: Left;  . APPLICATION OF WOUND VAC Right 08/06/2016   Procedure: APPLICATION OF PREVENA WOUND VAC;  Surgeon: Newt Minion, MD;  Location: [facility];  Service: Orthopedics;  Laterality: Right;  . AV FISTULA PLACEMENT Left [id number]   done at [facility]     2 stent   . CHOLECYSTECTOMY    . CORONARY STENT PLACEMENT    . FOOT ARTHRODESIS Right 08/06/2016   Procedure: Right Foot Fusion Lisfranc Joint;  Surgeon: Newt Minion, MD;  Location: [facility];  Service: Orthopedics;  Laterality: Right;  . HARDWARE REMOVAL Left 03/16/2015   Procedure: Removal Hardware Left Ankle;  Surgeon: Newt Minion, MD;  Location: [facility];  Service: Orthopedics;  Laterality: Left;  . IR AV DIALY SHUNT INTRO NEEDLE/INTRACATH INITIAL W/PTA/IMG LEFT  01/05/2018  . IR US GUIDE VASC ACCESS LEFT  01/05/2018  . LEFT HEART CATH AND CORONARY ANGIOGRAPHY N/A 12/28/2017   Procedure: LEFT HEART CATH AND CORONARY ANGIOGRAPHY;  Surgeon: Burnell Blanks, MD;  Location: [facility];  Service: Cardiovascular;  Laterality: N/A;  . ORIF ANKLE FRACTURE Left 11/20/2014   Procedure: OPEN REDUCTION INTERNAL FIXATION (ORIF) ANKLE FRACTURE;  Surgeon: Renette Butters, MD;  Location: [facility];  Service: Orthopedics;  Laterality: Left;  . TONSILLECTOMY     Social History   Occupational History  . Not on file  Tobacco Use  . Smoking status: Current Every Day Smoker    Packs/day: 1.00    Types: Cigarettes  . Smokeless tobacco: Never Used  Substance and Sexual Activity  . Alcohol use: No  . Drug use: No  . Sexual activity: Never

## 2019-09-30 ENCOUNTER — Encounter (HOSPITAL_COMMUNITY): Payer: Self-pay | Admitting: Emergency Medicine

## 2019-09-30 ENCOUNTER — Emergency Department (HOSPITAL_COMMUNITY): Payer: Medicare Other

## 2019-09-30 ENCOUNTER — Observation Stay (HOSPITAL_COMMUNITY)
Admission: EM | Admit: 2019-09-30 | Discharge: 2019-10-01 | Disposition: A | Discharge status: Home / Self Care | Payer: Medicare Other | Attending: Internal Medicine | Admitting: Internal Medicine

## 2019-09-30 ENCOUNTER — Other Ambulatory Visit: Payer: Self-pay

## 2019-09-30 DIAGNOSIS — Z794 Long term (current) use of insulin: Secondary | ICD-10-CM | POA: Diagnosis not present

## 2019-09-30 DIAGNOSIS — Z79899 Other long term (current) drug therapy: Secondary | ICD-10-CM | POA: Diagnosis not present

## 2019-09-30 DIAGNOSIS — I200 Unstable angina: Secondary | ICD-10-CM | POA: Diagnosis not present

## 2019-09-30 DIAGNOSIS — I160 Hypertensive urgency: Secondary | ICD-10-CM

## 2019-09-30 DIAGNOSIS — I2510 Atherosclerotic heart disease of native coronary artery without angina pectoris: Secondary | ICD-10-CM | POA: Insufficient documentation

## 2019-09-30 DIAGNOSIS — Z20822 Contact with and (suspected) exposure to covid-19: Secondary | ICD-10-CM | POA: Diagnosis not present

## 2019-09-30 DIAGNOSIS — Z955 Presence of coronary angioplasty implant and graft: Secondary | ICD-10-CM | POA: Insufficient documentation

## 2019-09-30 DIAGNOSIS — I5042 Chronic combined systolic (congestive) and diastolic (congestive) heart failure: Secondary | ICD-10-CM | POA: Insufficient documentation

## 2019-09-30 DIAGNOSIS — R079 Chest pain, unspecified: Secondary | ICD-10-CM | POA: Diagnosis not present

## 2019-09-30 DIAGNOSIS — Z7901 Long term (current) use of anticoagulants: Secondary | ICD-10-CM | POA: Diagnosis not present

## 2019-09-30 DIAGNOSIS — R9431 Abnormal electrocardiogram [ECG] [EKG]: Secondary | ICD-10-CM | POA: Diagnosis present

## 2019-09-30 DIAGNOSIS — N186 End stage renal disease: Secondary | ICD-10-CM | POA: Insufficient documentation

## 2019-09-30 DIAGNOSIS — E1122 Type 2 diabetes mellitus with diabetic chronic kidney disease: Secondary | ICD-10-CM | POA: Insufficient documentation

## 2019-09-30 DIAGNOSIS — F17210 Nicotine dependence, cigarettes, uncomplicated: Secondary | ICD-10-CM | POA: Insufficient documentation

## 2019-09-30 DIAGNOSIS — G4489 Other headache syndrome: Secondary | ICD-10-CM | POA: Diagnosis not present

## 2019-09-30 DIAGNOSIS — R519 Headache, unspecified: Secondary | ICD-10-CM | POA: Insufficient documentation

## 2019-09-30 DIAGNOSIS — E119 Type 2 diabetes mellitus without complications: Secondary | ICD-10-CM

## 2019-09-30 DIAGNOSIS — I132 Hypertensive heart and chronic kidney disease with heart failure and with stage 5 chronic kidney disease, or end stage renal disease: Secondary | ICD-10-CM | POA: Insufficient documentation

## 2019-09-30 DIAGNOSIS — Z992 Dependence on renal dialysis: Secondary | ICD-10-CM | POA: Insufficient documentation

## 2019-09-30 DIAGNOSIS — Z7982 Long term (current) use of aspirin: Secondary | ICD-10-CM | POA: Diagnosis not present

## 2019-09-30 DIAGNOSIS — Z86711 Personal history of pulmonary embolism: Secondary | ICD-10-CM | POA: Diagnosis present

## 2019-09-30 LAB — BASIC METABOLIC PANEL
Anion gap: 12 (ref 5–15)
BUN: 20 mg/dL (ref 8–23)
CO2: 32 mmol/L (ref 22–32)
Calcium: 9.3 mg/dL (ref 8.9–10.3)
Chloride: 95 mmol/L — ABNORMAL LOW (ref 98–111)
Creatinine, Ser: 7.14 mg/dL — ABNORMAL HIGH (ref 0.44–1.00)
GFR calc Af Amer: 6 mL/min — ABNORMAL LOW (ref >60)
GFR calc non Af Amer: 5 mL/min — ABNORMAL LOW (ref >60)
Glucose, Bld: 201 mg/dL — ABNORMAL HIGH (ref 70–99)
Potassium: 3.9 mmol/L (ref 3.5–5.1)
Sodium: 139 mmol/L (ref 135–145)

## 2019-09-30 LAB — TROPONIN I (HIGH SENSITIVITY)
Troponin I (High Sensitivity): 36 ng/L — ABNORMAL HIGH (ref <18)
Troponin I (High Sensitivity): 41 ng/L — ABNORMAL HIGH (ref <18)

## 2019-09-30 LAB — CBC
HCT: 33.7 % — ABNORMAL LOW (ref 36.0–46.0)
Hemoglobin: 10.1 g/dL — ABNORMAL LOW (ref 12.0–15.0)
MCH: 32.4 pg (ref 26.0–34.0)
MCHC: 30 g/dL (ref 30.0–36.0)
MCV: 108 fL — ABNORMAL HIGH (ref 80.0–100.0)
Platelets: 176 10*3/uL (ref 150–400)
RBC: 3.12 MIL/uL — ABNORMAL LOW (ref 3.87–5.11)
RDW: 16.1 % — ABNORMAL HIGH (ref 11.5–15.5)
WBC: 6.4 10*3/uL (ref 4.0–10.5)
nRBC: 0 % (ref 0.0–0.2)

## 2019-09-30 LAB — CBG MONITORING, ED: Glucose-Capillary: 111 mg/dL — ABNORMAL HIGH (ref 70–99)

## 2019-09-30 MED ORDER — SODIUM CHLORIDE 0.9% FLUSH: 3.0000 mL | Freq: Once | INTRAVENOUS | Status: DC

## 2019-09-30 MED ORDER — ASPIRIN 81 MG PO CHEW
324.0000 mg | CHEWABLE_TABLET | Freq: Once | ORAL | Status: AC
  Administered 2019-10-01: 324 mg via ORAL
  Filled 2019-09-30: qty 4

## 2019-09-30 MED ORDER — NITROGLYCERIN 2 % TD OINT
1.0000 [in_us] | TOPICAL_OINTMENT | Freq: Once | TRANSDERMAL | Status: AC
  Administered 2019-10-01: 1 [in_us] via TOPICAL
  Filled 2019-09-30: qty 1

## 2019-09-30 MED ORDER — ACETAMINOPHEN 325 MG PO TABS
325.0000 mg | ORAL_TABLET | Freq: Once | ORAL | Status: DC
  Filled 2019-09-30: qty 1

## 2019-09-30 MED ORDER — KETOROLAC TROMETHAMINE 30 MG/ML IJ SOLN
15.0000 mg | Freq: Once | INTRAMUSCULAR | Status: AC
  Administered 2019-10-01: 15 mg via INTRAVENOUS
  Filled 2019-09-30: qty 1

## 2019-09-30 MED ORDER — ACETAMINOPHEN 325 MG PO TABS
650.0000 mg | ORAL_TABLET | Freq: Once | ORAL | Status: DC
  Filled 2019-09-30: qty 2

## 2019-09-30 NOTE — ED Notes (Signed)
Pt states that she has chest pain that is described as full or tight and radiates to right arm. Has possible heart mummer and diminished lung sounds. Pt has +1 pitting edema on left ankle

## 2019-09-30 NOTE — ED Notes (Signed)
Pt is taken back to triage go get something for a headache

## 2019-09-30 NOTE — ED Triage Notes (Signed)
Pt arrives to ED from from home with complaints of right sided chest pain and headache that started this morning. Patient states chest pain continues to get worse as well has her headache. Patient states that within the last couple hours shes became nauseous. Patient is a T, TH, SA dialysis patient and has not missed an appointment.

## 2019-10-01 ENCOUNTER — Encounter (HOSPITAL_COMMUNITY): Payer: Self-pay | Admitting: Emergency Medicine

## 2019-10-01 DIAGNOSIS — G4489 Other headache syndrome: Secondary | ICD-10-CM | POA: Diagnosis not present

## 2019-10-01 DIAGNOSIS — I25110 Atherosclerotic heart disease of native coronary artery with unstable angina pectoris: Secondary | ICD-10-CM | POA: Diagnosis not present

## 2019-10-01 DIAGNOSIS — I5042 Chronic combined systolic (congestive) and diastolic (congestive) heart failure: Secondary | ICD-10-CM | POA: Diagnosis not present

## 2019-10-01 DIAGNOSIS — R079 Chest pain, unspecified: Secondary | ICD-10-CM | POA: Diagnosis not present

## 2019-10-01 DIAGNOSIS — Z86711 Personal history of pulmonary embolism: Secondary | ICD-10-CM | POA: Diagnosis present

## 2019-10-01 DIAGNOSIS — I160 Hypertensive urgency: Secondary | ICD-10-CM | POA: Diagnosis not present

## 2019-10-01 DIAGNOSIS — Z20822 Contact with and (suspected) exposure to covid-19: Secondary | ICD-10-CM | POA: Diagnosis not present

## 2019-10-01 DIAGNOSIS — Z794 Long term (current) use of insulin: Secondary | ICD-10-CM | POA: Diagnosis not present

## 2019-10-01 DIAGNOSIS — N186 End stage renal disease: Secondary | ICD-10-CM | POA: Diagnosis not present

## 2019-10-01 DIAGNOSIS — F17210 Nicotine dependence, cigarettes, uncomplicated: Secondary | ICD-10-CM | POA: Diagnosis not present

## 2019-10-01 DIAGNOSIS — R9431 Abnormal electrocardiogram [ECG] [EKG]: Secondary | ICD-10-CM | POA: Diagnosis not present

## 2019-10-01 DIAGNOSIS — Z7982 Long term (current) use of aspirin: Secondary | ICD-10-CM | POA: Diagnosis not present

## 2019-10-01 DIAGNOSIS — E119 Type 2 diabetes mellitus without complications: Secondary | ICD-10-CM | POA: Diagnosis not present

## 2019-10-01 DIAGNOSIS — I2510 Atherosclerotic heart disease of native coronary artery without angina pectoris: Secondary | ICD-10-CM | POA: Diagnosis not present

## 2019-10-01 DIAGNOSIS — Z992 Dependence on renal dialysis: Secondary | ICD-10-CM | POA: Diagnosis not present

## 2019-10-01 DIAGNOSIS — R519 Headache, unspecified: Secondary | ICD-10-CM | POA: Diagnosis not present

## 2019-10-01 DIAGNOSIS — R0789 Other chest pain: Secondary | ICD-10-CM

## 2019-10-01 DIAGNOSIS — I132 Hypertensive heart and chronic kidney disease with heart failure and with stage 5 chronic kidney disease, or end stage renal disease: Secondary | ICD-10-CM | POA: Diagnosis not present

## 2019-10-01 DIAGNOSIS — E1122 Type 2 diabetes mellitus with diabetic chronic kidney disease: Secondary | ICD-10-CM | POA: Diagnosis not present

## 2019-10-01 DIAGNOSIS — Z955 Presence of coronary angioplasty implant and graft: Secondary | ICD-10-CM | POA: Diagnosis not present

## 2019-10-01 LAB — GLUCOSE, CAPILLARY
Glucose-Capillary: 96 mg/dL (ref 70–99)
Glucose-Capillary: 176 mg/dL — ABNORMAL HIGH (ref 70–99)

## 2019-10-01 LAB — CBC
HCT: 35.1 % — ABNORMAL LOW (ref 36.0–46.0)
Hemoglobin: 11 g/dL — ABNORMAL LOW (ref 12.0–15.0)
MCH: 33.4 pg (ref 26.0–34.0)
MCHC: 31.3 g/dL (ref 30.0–36.0)
MCV: 106.7 fL — ABNORMAL HIGH (ref 80.0–100.0)
Platelets: 173 10*3/uL (ref 150–400)
RBC: 3.29 MIL/uL — ABNORMAL LOW (ref 3.87–5.11)
RDW: 16.3 % — ABNORMAL HIGH (ref 11.5–15.5)
WBC: 5 10*3/uL (ref 4.0–10.5)
nRBC: 0 % (ref 0.0–0.2)

## 2019-10-01 LAB — BASIC METABOLIC PANEL
Anion gap: 16 — ABNORMAL HIGH (ref 5–15)
BUN: 24 mg/dL — ABNORMAL HIGH (ref 8–23)
CO2: 27 mmol/L (ref 22–32)
Calcium: 9.4 mg/dL (ref 8.9–10.3)
Chloride: 97 mmol/L — ABNORMAL LOW (ref 98–111)
Creatinine, Ser: 7.98 mg/dL — ABNORMAL HIGH (ref 0.44–1.00)
GFR calc Af Amer: 5 mL/min — ABNORMAL LOW (ref >60)
GFR calc non Af Amer: 5 mL/min — ABNORMAL LOW (ref >60)
Glucose, Bld: 114 mg/dL — ABNORMAL HIGH (ref 70–99)
Potassium: 3.8 mmol/L (ref 3.5–5.1)
Sodium: 140 mmol/L (ref 135–145)

## 2019-10-01 LAB — RESPIRATORY PANEL BY RT PCR (FLU A&B, COVID)
Influenza A by PCR: NEGATIVE
Influenza B by PCR: NEGATIVE
SARS Coronavirus 2 by RT PCR: NEGATIVE

## 2019-10-01 LAB — HEMOGLOBIN A1C
Hgb A1c MFr Bld: 7.9 % — ABNORMAL HIGH (ref 4.8–5.6)
Mean Plasma Glucose: 180.03 mg/dL

## 2019-10-01 LAB — MAGNESIUM: Magnesium: 2.4 mg/dL (ref 1.7–2.4)

## 2019-10-01 LAB — TROPONIN I (HIGH SENSITIVITY): Troponin I (High Sensitivity): 38 ng/L — ABNORMAL HIGH (ref <18)

## 2019-10-01 MED ORDER — PANTOPRAZOLE SODIUM 20 MG PO TBEC
20.0000 mg | DELAYED_RELEASE_TABLET | Freq: Two times a day (BID) | ORAL | Status: DC
  Filled 2019-10-01: qty 1

## 2019-10-01 MED ORDER — INSULIN ASPART 100 UNIT/ML ~~LOC~~ SOLN: 0.0000 [IU] | Freq: Three times a day (TID) | SUBCUTANEOUS | Status: DC

## 2019-10-01 MED ORDER — ACETAMINOPHEN 325 MG PO TABS: 650.0000 mg | ORAL_TABLET | ORAL | Status: DC | PRN

## 2019-10-01 MED ORDER — ASPIRIN EC 81 MG PO TBEC: 81.0000 mg | DELAYED_RELEASE_TABLET | Freq: Every day | ORAL | Status: DC

## 2019-10-01 MED ORDER — APIXABAN 2.5 MG PO TABS
2.5000 mg | ORAL_TABLET | Freq: Two times a day (BID) | ORAL | Status: DC
  Filled 2019-10-01: qty 1

## 2019-10-01 MED ORDER — ATORVASTATIN CALCIUM 10 MG PO TABS: 20.0000 mg | ORAL_TABLET | Freq: Every day | ORAL | Status: DC

## 2019-10-01 MED ORDER — FENTANYL CITRATE (PF) 100 MCG/2ML IJ SOLN: 12.5000 ug | INTRAMUSCULAR | Status: DC | PRN

## 2019-10-01 MED ORDER — ISOSORBIDE MONONITRATE ER 60 MG PO TB24
60.0000 mg | ORAL_TABLET | Freq: Every day | ORAL | Status: DC
  Filled 2019-10-01: qty 1

## 2019-10-01 MED ORDER — METOPROLOL TARTRATE 25 MG PO TABS
75.0000 mg | ORAL_TABLET | Freq: Two times a day (BID) | ORAL | Status: DC
  Filled 2019-10-01: qty 3

## 2019-10-01 NOTE — Plan of Care (Signed)
  Problem: Clinical Measurements: Goal: Cardiovascular complication will be avoided Outcome: Progressing   Problem: Activity: Goal: Risk for activity intolerance will decrease Outcome: Progressing   Problem: Safety: Goal: Ability to remain free from injury will improve Outcome: Progressing   

## 2019-10-01 NOTE — ED Provider Notes (Signed)
[facility] EMERGENCY DEPARTMENT Provider Note   CSN: [id number] Arrival date & time: 09/30/19  1659     History Chief Complaint  Patient presents with  . Headache  . Chest Pain    Robin [name] is a 69 y.o. female.  The history is provided by the patient.  Headache Pain location:  L parietal Quality:  Dull Radiates to:  Does not radiate Severity currently:  8/10 Severity at highest:  8/10 Onset quality:  Gradual Duration:  2 weeks Timing:  Constant Progression:  Unchanged Chronicity:  Chronic Context: not activity, not exposure to bright light, not caffeine, not coughing, not defecating, not eating, not stress and not exposure to cold air   Relieved by:  Nothing Worsened by:  Nothing Ineffective treatments:  None tried Associated symptoms: no abdominal pain, no cough, no diarrhea, no ear pain, no facial pain, no fatigue, no fever, no focal weakness, no myalgias, no neck pain, no neck stiffness, no numbness, no paresthesias, no photophobia, no URI and no visual change   Risk factors: no anger   Chest Pain Pain location:  R chest Pain quality: aching   Pain radiates to:  Does not radiate Associated symptoms: headache   Associated symptoms: no abdominal pain, no cough, no fatigue, no fever and no numbness   Patient has describes stabbing pain about the left ear.  No neck pain. No changes in vision nor speech nor cognition.       Past Medical History:  Diagnosis Date  . Acute delirium 11/18/2014  . Acute encephalopathy   . Acute on chronic respiratory failure with hypoxia ([place]) 09/09/2016  . Acute respiratory failure with hypoxia ([place]) 11/29/2015  . Anemia in chronic kidney disease 12/09/2014  . Anxiety   . Arthritis   . Benign hypertension   . Bipolar affective disorder ([place]) 12/09/2014  . CAD (coronary artery disease), native coronary artery with 2 stents  11/17/2014  . Charcot foot due to diabetes mellitus ([place])   . Chronic combined systolic and  diastolic CHF (congestive heart failure) ([place]) 11/18/2014  . Closed left ankle fracture 11/17/2014  . Confusion 01/21/2015  . Depression   . Diabetes mellitus without complication ([place])   . End stage renal disease on dialysis ([place])   . Fracture dislocation of ankle 11/17/2014  . GERD (gastroesophageal reflux disease)   . Heart murmur   . History of blood transfusion   . History of bronchitis   . History of pneumonia   . Hyperlipidemia 03/26/2015  . Hypertension associated with diabetes ([place]) 11/18/2014  . Hypertensive heart/renal disease with failure ([place]) 12/09/2014  . Hypokalemia 11/17/2014  . Multiple falls 01/21/2015  . Obesity 11/17/2014  . Onychomycosis 10/07/2016  . Right leg DVT ([place]) 12/05/2015  . SIRS (systemic inflammatory response syndrome) ([place]) 08/08/2016  . Sleep apnea   . Unstable angina ([place]) 12/26/2017  . Ventricular tachycardia [facility])     Patient Active Problem List   Diagnosis Date Noted  . Orthostatic dizziness 04/15/2019  . Bradycardia 04/15/2019  . Syncope   . ESRD on hemodialysis ([place])   . Chest pain at rest 09/16/2018  . Diabetic Charct's arthropathy ([place]) 10/07/2016  . GERD (gastroesophageal reflux disease) 09/09/2016  . Depression 09/09/2016  . Chronic diastolic (congestive) heart failure ([place]) 09/09/2016  . Elevated troponin 09/09/2016  . Hyperlipidemia associated with type 2 diabetes mellitus ([place]) 03/26/2015  . Charcot ankle 03/16/2015  . Anemia, chronic renal failure   . NSVT (nonsustained ventricular tachycardia) ([place])   .  Hypertension due to end stage renal disease caused by type 2 diabetes mellitus, on dialysis ([place]) 12/09/2014  . Bipolar affective disorder ([place]) 12/09/2014  . Diabetes mellitus type 2, insulin dependent ([place]) 11/17/2014  . CAD (coronary artery disease), native coronary artery with 2 stents  11/17/2014    Past Surgical History:  Procedure Laterality Date  . ABDOMINAL HYSTERECTOMY    . ANKLE CLOSED REDUCTION N/A 11/17/2014    Procedure: CLOSED REDUCTION ANKLE;  Surgeon: Earlie Server, MD;  Location: [facility];  Service: Orthopedics;  Laterality: N/A;  . ANKLE FUSION Left 03/16/2015   Procedure: Left Tibiocalcaneal Fusion;  Surgeon: Newt Minion, MD;  Location: [facility];  Service: Orthopedics;  Laterality: Left;  . APPLICATION OF WOUND VAC Right 08/06/2016   Procedure: APPLICATION OF PREVENA WOUND VAC;  Surgeon: Newt Minion, MD;  Location: [facility];  Service: Orthopedics;  Laterality: Right;  . AV FISTULA PLACEMENT Left [id number]   done at [place]     2 stent   . CHOLECYSTECTOMY    . CORONARY STENT PLACEMENT    . FOOT ARTHRODESIS Right 08/06/2016   Procedure: Right Foot Fusion Lisfranc Joint;  Surgeon: Newt Minion, MD;  Location: [facility];  Service: Orthopedics;  Laterality: Right;  . HARDWARE REMOVAL Left 03/16/2015   Procedure: Removal Hardware Left Ankle;  Surgeon: Newt Minion, MD;  Location: [facility];  Service: Orthopedics;  Laterality: Left;  . IR AV DIALY SHUNT INTRO NEEDLE/INTRACATH INITIAL W/PTA/IMG LEFT  01/05/2018  . IR US GUIDE VASC ACCESS LEFT  01/05/2018  . LEFT HEART CATH AND CORONARY ANGIOGRAPHY N/A 12/28/2017   Procedure: LEFT HEART CATH AND CORONARY ANGIOGRAPHY;  Surgeon: Burnell Blanks, MD;  Location: [facility];  Service: Cardiovascular;  Laterality: N/A;  . ORIF ANKLE FRACTURE Left 11/20/2014   Procedure: OPEN REDUCTION INTERNAL FIXATION (ORIF) ANKLE FRACTURE;  Surgeon: Renette Butters, MD;  Location: [facility];  Service: Orthopedics;  Laterality: Left;  . TONSILLECTOMY       OB History   No obstetric history on file.     Family History  Family history unknown: Yes    Social History   Tobacco Use  . Smoking status: Current Every Day Smoker    Packs/day: 1.00    Types: Cigarettes  . Smokeless tobacco: Never Used  Substance Use Topics  . Alcohol use: No  . Drug use: No    Home Medications Prior to Admission medications   Medication Sig Start Date  End Date Taking? Authorizing Provider  acetaminophen (TYLENOL) 325 MG tablet Take 325-975 mg by mouth every 6 (six) hours as needed for mild pain or headache.    [provider]  albuterol (VENTOLIN HFA) 108 (90 Base) MCG/ACT inhaler Inhale 1-2 puffs into the lungs every 6 (six) hours as needed for wheezing or shortness of breath.    [provider]  amLODipine (NORVASC) 5 MG tablet Take 1 tablet (5 mg total) by mouth daily. 09/18/18   Chundi, Verne Spurr, MD  ammonium lactate (LAC-HYDRIN) 12 % lotion Apply 1 application topically 2 (two) times daily as needed (for itching/irritation).     [provider]  ARIPiprazole (ABILIFY) 15 MG tablet Take 7.5 mg by mouth daily.     [provider]  aspirin 81 MG chewable tablet Chew 1 tablet (81 mg total) by mouth daily. 12/30/17   Asencion Noble, MD  atorvastatin (LIPITOR) 40 MG tablet Take 20 mg by mouth at bedtime.  [provider]  AURYXIA 1 GM 210 MG(Fe) tablet Take 210 mg by mouth daily. 02/22/19   [provider]  dextrose (GLUTOSE) 40 % GEL Take 1 Tube by mouth once as needed (for blood sugar less than 60).     [provider]  ELIQUIS 5 MG TABS tablet Take 2.5 mg by mouth 2 (two) times daily. 10/21/18   [provider]  gabapentin (NEURONTIN) 300 MG capsule Take 1 capsule (300 mg total) by mouth 2 (two) times daily. 06/24/19   Suzan Slick, NP  insulin detemir (LEVEMIR) 100 UNIT/ML injection Inject 0.08 mLs (8 Units total) into the skin at bedtime. Patient taking differently: Inject 20 Units into the skin 2 (two) times daily.  09/15/16   Oswald Hillock, MD  insulin lispro (HUMALOG) 100 UNIT/ML injection Inject 3-15 Units into the skin 3 (three) times daily before meals. Pt uses as needed per sliding scale:    150-200:  3  units 201-250:  5 units 251-300:  8 units 301-350:  10 units 351-400:  12 units and call MD 400+ 15 units    [provider]  isosorbide mononitrate  (IMDUR) 60 MG 24 hr tablet Take 60 mg by mouth daily.     [provider]  Melatonin 5 MG TABS Take 10 mg by mouth at bedtime.     [provider]  metoprolol tartrate 75 MG TABS Take 75 mg by mouth 2 (two) times daily. 04/17/19   Daisy Floro, DO  multivitamin (RENA-VIT) TABS tablet Take 1 tablet by mouth at bedtime.  12/11/17   [provider]  nitroGLYCERIN (NITROSTAT) 0.4 MG SL tablet Place 0.4 mg under the tongue every 5 (five) minutes as needed for chest pain.    [provider]  pantoprazole (PROTONIX) 20 MG tablet Take 20 mg by mouth 2 (two) times daily.     [provider]    Allergies    Patient has no known allergies.  Review of Systems   Review of Systems  Constitutional: Negative for fatigue and fever.  HENT: Negative for ear pain.   Eyes: Negative for photophobia.  Respiratory: Negative for cough.   Cardiovascular: Positive for chest pain.  Gastrointestinal: Negative for abdominal pain and diarrhea.  Musculoskeletal: Negative for myalgias, neck pain and neck stiffness.  Neurological: Positive for headaches. Negative for focal weakness, numbness and paresthesias.    Physical Exam Updated Vital Signs BP (!) 180/89   Pulse (!) 59   Temp 98.8 F (37.1 C) (Oral)   Resp 14   Ht 5\' 4"  (1.626 m)   Wt 74.4 kg   SpO2 95%   BMI 28.15 kg/m   Physical Exam  ED Results / Procedures / Treatments   Labs (all labs ordered are listed, but only abnormal results are displayed) Results for orders placed or performed during the hospital encounter of 37/62/83  Basic metabolic panel  Result Value Ref Range   Sodium 139 135 - 145 mmol/L   Potassium 3.9 3.5 - 5.1 mmol/L   Chloride 95 (L) 98 - 111 mmol/L   CO2 32 22 - 32 mmol/L   Glucose, Bld 201 (H) 70 - 99 mg/dL   BUN 20 8 - 23 mg/dL   Creatinine, Ser 7.14 (H) 0.44 - 1.00 mg/dL   Calcium 9.3 8.9 - 10.3 mg/dL   GFR calc non Af Amer 5 (L) >60 mL/min   GFR calc Af Amer 6 (L) >60  mL/min   Anion  gap 12 5 - 15  CBC  Result Value Ref Range   WBC 6.4 4.0 - 10.5 K/uL   RBC 3.12 (L) 3.87 - 5.11 MIL/uL   Hemoglobin 10.1 (L) 12.0 - 15.0 g/dL   HCT 33.7 (L) 36.0 - 46.0 %   MCV 108.0 (H) 80.0 - 100.0 fL   MCH 32.4 26.0 - 34.0 pg   MCHC 30.0 30.0 - 36.0 g/dL   RDW 16.1 (H) 11.5 - 15.5 %   Platelets 176 150 - 400 K/uL   nRBC 0.0 0.0 - 0.2 %  CBG monitoring, ED  Result Value Ref Range   Glucose-Capillary 111 (H) 70 - 99 mg/dL  Troponin I (High Sensitivity)  Result Value Ref Range   Troponin I (High Sensitivity) 36 (H) <18 ng/L  Troponin I (High Sensitivity)  Result Value Ref Range   Troponin I (High Sensitivity) 41 (H) <18 ng/L   DG Chest 2 View  Result Date: 09/30/2019 CLINICAL DATA:  Chest pain EXAM: CHEST - 2 VIEW COMPARISON:  04/14/2019 FINDINGS: Cardiomegaly. No pleural effusion, edema or focal opacity. No pneumothorax. IMPRESSION: Cardiomegaly.  Negative for edema or infiltrate Electronically Signed   By: Donavan Foil M.D.   On: 09/30/2019 18:34   CT Head Wo Contrast  Result Date: 09/30/2019 CLINICAL DATA:  Worsening headache with nausea EXAM: CT HEAD WITHOUT CONTRAST TECHNIQUE: Contiguous axial images were obtained from the base of the skull through the vertex without intravenous contrast. COMPARISON:  CT brain 01/20/2015, MRI 03/16/2016 FINDINGS: Brain: No acute territorial infarction, hemorrhage or intracranial mass. Stable moderate atrophy. Stable prominent ventricle size. Vascular: No hyperdense vessels. Carotid vascular and vertebral calcification. Skull: Normal. Negative for fracture or focal lesion. Sinuses/Orbits: No acute finding. Other: None IMPRESSION: 1. No CT evidence for acute intracranial abnormality. 2. Atrophy Electronically Signed   By: Donavan Foil M.D.   On: 09/30/2019 23:48    EKG EKG Interpretation  Date/Time:  Friday Sep 30 2019 17:05:17 EDT Ventricular Rate:  70 PR Interval:  188 QRS Duration: 90 QT Interval:  488 QTC  Calculation: 527 R Axis:   -50 Text Interpretation: Sinus rhythm with marked sinus arrhythmia Left axis deviation Anterior infarct , age undetermined ST & T wave abnormality, consider lateral ischemia Prolonged QT Confirmed by Dory Horn) on 09/30/2019 11:19:21 PM   Radiology DG Chest 2 View  Result Date: 09/30/2019 CLINICAL DATA:  Chest pain EXAM: CHEST - 2 VIEW COMPARISON:  04/14/2019 FINDINGS: Cardiomegaly. No pleural effusion, edema or focal opacity. No pneumothorax. IMPRESSION: Cardiomegaly.  Negative for edema or infiltrate Electronically Signed   By: Donavan Foil M.D.   On: 09/30/2019 18:34   CT Head Wo Contrast  Result Date: 09/30/2019 CLINICAL DATA:  Worsening headache with nausea EXAM: CT HEAD WITHOUT CONTRAST TECHNIQUE: Contiguous axial images were obtained from the base of the skull through the vertex without intravenous contrast. COMPARISON:  CT brain 01/20/2015, MRI 03/16/2016 FINDINGS: Brain: No acute territorial infarction, hemorrhage or intracranial mass. Stable moderate atrophy. Stable prominent ventricle size. Vascular: No hyperdense vessels. Carotid vascular and vertebral calcification. Skull: Normal. Negative for fracture or focal lesion. Sinuses/Orbits: No acute finding. Other: None IMPRESSION: 1. No CT evidence for acute intracranial abnormality. 2. Atrophy Electronically Signed   By: Donavan Foil M.D.   On: 09/30/2019 23:48    Procedures Procedures (including critical care time)  Medications Ordered in ED Medications  sodium chloride flush (NS) 0.9 % injection 3 mL (has no administration in time range)  acetaminophen (TYLENOL)  tablet 650 mg (has no administration in time range)  acetaminophen (TYLENOL) tablet 325 mg (has no administration in time range)  ketorolac (TORADOL) 30 MG/ML injection 15 mg (has no administration in time range)  aspirin chewable tablet 324 mg (has no administration in time range)  nitroGLYCERIN (NITROGLYN) 2 % ointment 1 inch (has no  administration in time range)    ED Course  I have reviewed the triage vital signs and the nursing notes.  Pertinent labs & imaging results that were available during my care of the patient were reviewed by me and considered in my medical decision making (see chart for details).    Patient had a normal head CT with these ongoing symptoms.  I suspect this is hypertensive urgency causing both issues.  I will admit the patient to medicine for ongoing evaluation and care.   Final Clinical Impression(s) / ED Diagnoses Final diagnoses:  None    Rx / DC Orders ED Discharge Orders    None       Karlene Southard, MD 10/01/19 0111

## 2019-10-01 NOTE — H&P (Signed)
History and Physical    CHALA GUL CWC:[id number] DOB: Jun 07, 1950 DOA: 09/30/2019  PCP: [facility] [place]   Patient coming from: Home   Chief Complaint: Chest pain, headache   HPI: Robin [name] is a 69 y.o. female with medical history significant for ESRD on hemodialysis, coronary artery disease, chronic diastolic CHF, history of PE on Eliquis, and diabetes, now presenting to the emergency department for evaluation of chest pain and headache.  The patient reports that she has had intermittent chest pain for weeks but it has been more persistent today.  She also has a headache today that is localized to the left ear.  Chest pain is localized to the right anterior chest and worse with certain arm movements.  She denies any associated shortness of breath, diaphoresis, or nausea.  She reports that her head on the left side around her ear has been hurting without any change in vision or hearing and without any focal numbness or weakness.  She denies any fevers, chills, or neck pain or neck stiffness.  ED Course: Upon arrival to the ED, patient is found to be afebrile, saturating low 90s on room air, and hypertensive to 180/90.  EKG features sinus rhythm with sinus arrhythmia, lateral ST-T abnormality, and QTc interval 577 ms.  Chest x-ray with cardiomegaly but no edema or infiltrate.  Head CT is negative for acute findings.  Chemistry panel with normal potassium, normal bicarbonate, and normal BUN.  CBC with chronic microcytic anemia.  Troponin is elevated to 46, and then 41.  Patient was treated with nitroglycerin, Toradol, aspirin, and acetaminophen in the ED.  Review of Systems:  All other systems reviewed and apart from HPI, are negative.  Past Medical History:  Diagnosis Date  . Acute delirium 11/18/2014  . Acute encephalopathy   . Acute on chronic respiratory failure with hypoxia ([place]) 09/09/2016  . Acute respiratory failure with hypoxia ([place]) 11/29/2015  . Anemia in chronic kidney  disease 12/09/2014  . Anxiety   . Arthritis   . Benign hypertension   . Bipolar affective disorder ([place]) 12/09/2014  . CAD (coronary artery disease), native coronary artery with 2 stents  11/17/2014  . Charcot foot due to diabetes mellitus ([place])   . Chronic combined systolic and diastolic CHF (congestive heart failure) ([place]) 11/18/2014  . Closed left ankle fracture 11/17/2014  . Confusion 01/21/2015  . Depression   . Diabetes mellitus without complication ([place])   . End stage renal disease on dialysis ([place])   . Fracture dislocation of ankle 11/17/2014  . GERD (gastroesophageal reflux disease)   . Heart murmur   . History of blood transfusion   . History of bronchitis   . History of pneumonia   . Hyperlipidemia 03/26/2015  . Hypertension associated with diabetes ([place]) 11/18/2014  . Hypertensive heart/renal disease with failure ([place]) 12/09/2014  . Hypokalemia 11/17/2014  . Multiple falls 01/21/2015  . Obesity 11/17/2014  . Onychomycosis 10/07/2016  . Right leg DVT ([place]) 12/05/2015  . SIRS (systemic inflammatory response syndrome) ([place]) 08/08/2016  . Sleep apnea   . Unstable angina ([place]) 12/26/2017  . Ventricular tachycardia [name])     Past Surgical History:  Procedure Laterality Date  . ABDOMINAL HYSTERECTOMY    . ANKLE CLOSED REDUCTION N/A 11/17/2014   Procedure: CLOSED REDUCTION ANKLE;  Surgeon: Earlie Server, MD;  Location: [place];  Service: Orthopedics;  Laterality: N/A;  . ANKLE FUSION Left 03/16/2015   Procedure: Left Tibiocalcaneal Fusion;  Surgeon: Newt Minion, MD;  Location: [place];  Service: Orthopedics;  Laterality: Left;  . APPLICATION OF WOUND VAC Right 08/06/2016   Procedure: APPLICATION OF PREVENA WOUND VAC;  Surgeon: Newt Minion, MD;  Location: [place];  Service: Orthopedics;  Laterality: Right;  . AV FISTULA PLACEMENT Left [id number]   done at [place]     2 stent   . CHOLECYSTECTOMY    . CORONARY STENT PLACEMENT    . FOOT ARTHRODESIS Right  08/06/2016   Procedure: Right Foot Fusion Lisfranc Joint;  Surgeon: Newt Minion, MD;  Location: [place];  Service: Orthopedics;  Laterality: Right;  . HARDWARE REMOVAL Left 03/16/2015   Procedure: Removal Hardware Left Ankle;  Surgeon: Newt Minion, MD;  Location: [place];  Service: Orthopedics;  Laterality: Left;  . IR AV DIALY SHUNT INTRO NEEDLE/INTRACATH INITIAL W/PTA/IMG LEFT  01/05/2018  . IR US GUIDE VASC ACCESS LEFT  01/05/2018  . LEFT HEART CATH AND CORONARY ANGIOGRAPHY N/A 12/28/2017   Procedure: LEFT HEART CATH AND CORONARY ANGIOGRAPHY;  Surgeon: Burnell Blanks, MD;  Location: [place] [facility];  Service: Cardiovascular;  Laterality: N/A;  . ORIF ANKLE FRACTURE Left 11/20/2014   Procedure: OPEN REDUCTION INTERNAL FIXATION (ORIF) ANKLE FRACTURE;  Surgeon: Renette Butters, MD;  Location: [place];  Service: Orthopedics;  Laterality: Left;  . TONSILLECTOMY       reports that she has been smoking cigarettes. She has been smoking about 1.00 pack per day. She has never used smokeless tobacco. She reports that she does not drink alcohol or use drugs.  No Known Allergies  Family History  Family history unknown: Yes     Prior to Admission medications   Medication Sig Start Date End Date Taking? Authorizing Provider  acetaminophen (TYLENOL) 325 MG tablet Take 325-975 mg by mouth every 6 (six) hours as needed for mild pain or headache.    [provider]  albuterol (VENTOLIN HFA) 108 (90 Base) MCG/ACT inhaler Inhale 1-2 puffs into the lungs every 6 (six) hours as needed for wheezing or shortness of breath.    [provider]  amLODipine (NORVASC) 5 MG tablet Take 1 tablet (5 mg total) by mouth daily. 09/18/18   Chundi, Verne Spurr, MD  ammonium lactate (LAC-HYDRIN) 12 % lotion Apply 1 application topically 2 (two) times daily as needed (for itching/irritation).     [provider]  ARIPiprazole (ABILIFY) 15 MG tablet Take 7.5 mg by mouth daily.     [provider]  aspirin 81 MG chewable tablet Chew 1 tablet (81 mg total) by mouth daily. 12/30/17   Asencion Noble, MD  atorvastatin (LIPITOR) 40 MG tablet Take 20 mg by mouth at bedtime.     [provider]  AURYXIA 1 GM 210 MG(Fe) tablet Take 210 mg by mouth daily. 02/22/19   [provider]  dextrose (GLUTOSE) 40 % GEL Take 1 Tube by mouth once as needed (for blood sugar less than 60).     [provider]  ELIQUIS 5 MG TABS tablet Take 2.5 mg by mouth 2 (two) times daily. 10/21/18   [provider]  gabapentin (NEURONTIN) 300 MG capsule Take 1 capsule (300 mg total) by mouth 2 (two) times daily. 06/24/19   Suzan Slick, NP  insulin detemir (LEVEMIR) 100 UNIT/ML injection Inject 0.08 mLs (8 Units total) into the skin at bedtime. Patient taking differently: Inject 20 Units into the skin 2 (two) times daily.  09/15/16  Oswald Hillock, MD  insulin lispro (HUMALOG) 100 UNIT/ML injection Inject 3-15 Units into the skin 3 (three) times daily before meals. Pt uses as needed per sliding scale:    150-200:  3  units 201-250:  5 units 251-300:  8 units 301-350:  10 units 351-400:  12 units and call MD 400+ 15 units    [provider]  isosorbide mononitrate (IMDUR) 60 MG 24 hr tablet Take 60 mg by mouth daily.     [provider]  Melatonin 5 MG TABS Take 10 mg by mouth at bedtime.     [provider]  metoprolol tartrate 75 MG TABS Take 75 mg by mouth 2 (two) times daily. 04/17/19   Daisy Floro, DO  multivitamin (RENA-VIT) TABS tablet Take 1 tablet by mouth at bedtime.  12/11/17   [provider]  nitroGLYCERIN (NITROSTAT) 0.4 MG SL tablet Place 0.4 mg under the tongue every 5 (five) minutes as needed for chest pain.    [provider]  pantoprazole (PROTONIX) 20 MG tablet Take 20 mg by mouth 2 (two) times daily.     [provider]    Physical Exam: Vitals:   09/30/19 1933 09/30/19 2300 09/30/19 2315  10/01/19 0015  BP: (!) 160/74 (!) 172/80 (!) 180/89 (!) 175/92  Pulse: 62 60 (!) 59 62  Resp: 18 16 14 17   Temp:      TempSrc:      SpO2: 92% 94% 95% 96%  Weight:      Height:        Constitutional: NAD, calm  Eyes: PERTLA, lids and conjunctivae normal ENMT: Mucous membranes are moist. Posterior pharynx clear of any exudate or lesions.   Neck: normal, supple, no masses, no thyromegaly Respiratory:  no wheezing, no crackles. No accessory muscle use.  Cardiovascular: S1 & S2 heard, regular rate and rhythm. Mild lower leg swelling, more on left where there is ankle deformity and surgical scar.   Abdomen: No distension, no tenderness, soft. Bowel sounds active.  Musculoskeletal: no clubbing / cyanosis. No joint deformity upper and lower extremities.   Skin: no significant rashes, lesions, ulcers. Warm, dry, well-perfused. Neurologic: No gross facial asymmetry. Mild dysarthria. Sensation to light touch intact. Strength 5/5 in all 4 limbs.  Psychiatric: Alert and oriented to person, place, and situation. Pleasant and cooperative.    Labs and Imaging on Admission: I have personally reviewed following labs and imaging studies  CBC: Recent Labs  Lab 09/30/19 1742  WBC 6.4  HGB 10.1*  HCT 33.7*  MCV 108.0*  PLT 397   Basic Metabolic Panel: Recent Labs  Lab 09/30/19 1742  NA 139  K 3.9  CL 95*  CO2 32  GLUCOSE 201*  BUN 20  CREATININE 7.14*  CALCIUM 9.3   GFR: Estimated Creatinine Clearance: 7.6 mL/min (A) (by C-G formula based on SCr of 7.14 mg/dL (H)). Liver Function Tests: No results for input(s): AST, ALT, ALKPHOS, BILITOT, PROT, ALBUMIN in the last 168 hours. No results for input(s): LIPASE, AMYLASE in the last 168 hours. No results for input(s): AMMONIA in the last 168 hours. Coagulation Profile: No results for input(s): INR, PROTIME in the last 168 hours. Cardiac Enzymes: No results for input(s): CKTOTAL, CKMB, CKMBINDEX, TROPONINI in the last 168 hours. BNP  (last 3 results) No results for input(s): PROBNP in the last 8760 hours. HbA1C: No results for input(s): HGBA1C in the last 72 hours. CBG: Recent Labs  Lab 09/30/19 2350  GLUCAP  111*   Lipid Profile: No results for input(s): CHOL, HDL, LDLCALC, TRIG, CHOLHDL, LDLDIRECT in the last 72 hours. Thyroid Function Tests: No results for input(s): TSH, T4TOTAL, FREET4, T3FREE, THYROIDAB in the last 72 hours. Anemia Panel: No results for input(s): VITAMINB12, FOLATE, FERRITIN, TIBC, IRON, RETICCTPCT in the last 72 hours. Urine analysis:    Component Value Date/Time   COLORURINE YELLOW 09/09/2016 0337   APPEARANCEUR HAZY (A) 09/09/2016 0337   LABSPEC 1.010 09/09/2016 0337   PHURINE 5.0 09/09/2016 0337   GLUCOSEU NEGATIVE 09/09/2016 0337   HGBUR NEGATIVE 09/09/2016 0337   BILIRUBINUR NEGATIVE 09/09/2016 0337   KETONESUR NEGATIVE 09/09/2016 0337   PROTEINUR 100 (A) 09/09/2016 0337   UROBILINOGEN 0.2 01/21/2015 0416   NITRITE NEGATIVE 09/09/2016 0337   LEUKOCYTESUR NEGATIVE 09/09/2016 0337   Sepsis Labs: @LABRCNTIP (procalcitonin:4,lacticidven:4) )No results found for this or any previous visit (from the past 240 hour(s)).   Radiological Exams on Admission: DG Chest 2 View  Result Date: 09/30/2019 CLINICAL DATA:  Chest pain EXAM: CHEST - 2 VIEW COMPARISON:  04/14/2019 FINDINGS: Cardiomegaly. No pleural effusion, edema or focal opacity. No pneumothorax. IMPRESSION: Cardiomegaly.  Negative for edema or infiltrate Electronically Signed   By: Donavan Foil M.D.   On: 09/30/2019 18:34   CT Head Wo Contrast  Result Date: 09/30/2019 CLINICAL DATA:  Worsening headache with nausea EXAM: CT HEAD WITHOUT CONTRAST TECHNIQUE: Contiguous axial images were obtained from the base of the skull through the vertex without intravenous contrast. COMPARISON:  CT brain 01/20/2015, MRI 03/16/2016 FINDINGS: Brain: No acute territorial infarction, hemorrhage or intracranial mass. Stable moderate atrophy. Stable  prominent ventricle size. Vascular: No hyperdense vessels. Carotid vascular and vertebral calcification. Skull: Normal. Negative for fracture or focal lesion. Sinuses/Orbits: No acute finding. Other: None IMPRESSION: 1. No CT evidence for acute intracranial abnormality. 2. Atrophy Electronically Signed   By: Donavan Foil M.D.   On: 09/30/2019 23:48    EKG: Independently reviewed. Sinus rhythm with sinus arrhythmia, lateral ST-T abnormality, QTc 577 ms, similar to prior.   Assessment/Plan   1. Chest pain; CAD  - Presents with intermittent chest discomfort, has known CAD, and is found to have EKG with ST-T abnormality in lateral leads that appears similar to prior, no acute findings on CXR, and mild troponin elevations - She has known CAD and troponin is elevated but her current pain is on the right and reproducible, did not improve with NTG, and most likely MSK etiology  - Continue cardiac monitoring, trend troponin, continue ASA and statin, repeat EKG    2. ESRD  - Reports completing HD on 5/6  - No indications for urgent HD  - SLIV, renally-dose medications   3. Insulin-dependent DM  - A1c was 10% in November 2020  - Continue CBG checks and insulin   4. History of PE  - No respiratory s/s currently   - Continue Eliquis   5. Headache  - Head CT negative  - Pain is around left ear and otoscopic exam normal  - Continue BP-control, analgesia   6. Prolonged QT interval  - QTc is 577 ms in ED  - Continue cardiac monitoring, minimize QT-prolonging medications, repeat EKG in am    DVT prophylaxis: Eliquis  Code Status: Full  Family Communication: Discussed with patient  Disposition Plan:  Patient is from: Home  Anticipated d/c is to: Home  Anticipated d/c date is: 10/02/19 Patient currently: Pending evaluation of chest pain  Consults called: None  Admission status: Observation  Vianne Bulls, MD [facility] Pager: See [email or web address]  If 7AM-7PM, please contact the  daytime attending [email or web address]  10/01/2019, 1:07 AM

## 2019-10-01 NOTE — Discharge Summary (Signed)
Physician Discharge Summary  LUCRESHA DISMUKE RXV:[id number] DOB: [birth year] DOA: 09/30/2019  PCP: [facility]  Admit date: 09/30/2019 Discharge date: 10/01/2019  Admitted From: Home Disposition: Home  Recommendations for Outpatient Follow-up:  1. Follow up with PCP in 1-2 weeks, nephrology as scheduled 2. Please obtain BMP/CBC in one week 3. Please follow up on the following pending results:  Discharge Condition: Stable CODE STATUS: Full Diet recommendation: Diet  Brief/Interim Summary: Robin BARRETTO is a 69 y.o. female with medical history significant for ESRD on hemodialysis, coronary artery disease, chronic diastolic CHF, history of PE on Eliquis, and diabetes, now presenting to the emergency department for evaluation of chest pain and headache.  The patient reports that she has had intermittent chest pain for weeks but it has been more persistent today.  She also has a headache today that is localized to the left ear.  Chest pain is localized to the right anterior chest and worse with certain arm movements.  She denies any associated shortness of breath, diaphoresis, or nausea.  She reports that her head on the left side around her ear has been hurting without any change in vision or hearing and without any focal numbness or weakness.  She denies any fevers, chills, or neck pain or neck stiffness. Upon arrival to the ED, patient is found to be afebrile, saturating low 90s on room air, and hypertensive to 180/90.  EKG features sinus rhythm with sinus arrhythmia, lateral ST-T abnormality, and QTc interval 577 ms.  Chest x-ray with cardiomegaly but no edema or infiltrate.  Head CT is negative for acute findings.  Chemistry panel with normal potassium, normal bicarbonate, and normal BUN.  CBC with chronic microcytic anemia.  Troponin is elevated to 46, and then 41.  Patient was treated with nitroglycerin, Toradol, aspirin, and acetaminophen in the ED.  Patient met as above with acute  atypical chest pain in the setting of known chronic cardiac disease as above, troponin minimally elevated likely in the setting ESRD, her chest pain when described to me appears to be more epigastric and squeezing in nature, somewhat fleeting, notable right shoulder trapezius and deltoid pain upon range of motion but otherwise denies any ongoing chest pain at this point.  Given reassuring troponin, EKG and otherwise unremarkable exam patient is otherwise stable and agreeable for discharge.  She does have history of ESRD on dialysis Tuesday Thursday Saturday, nephrology was sidelined and able to ensure patient can return back to her home dialysis center today to complete her dialysis.  Patient otherwise to follow-up with PCP and nephrology as scheduled in the next 1 to 2 weeks.  Discharge Diagnoses:  Principal Problem:   Chest pain Active Problems:   Diabetes mellitus type 2, insulin dependent (HCC)   CAD (coronary artery disease), native coronary artery with 2 stents    ESRD on hemodialysis ([place])   History of pulmonary embolism   Prolonged QT interval    Discharge Instructions  Discharge Instructions    Call MD for:  difficulty breathing, headache or visual disturbances   Complete by: As directed    Call MD for:  extreme fatigue   Complete by: As directed    Call MD for:  hives   Complete by: As directed    Call MD for:  persistant dizziness or light-headedness   Complete by: As directed    Call MD for:  persistant nausea and vomiting   Complete by: As directed    Call MD for:  severe uncontrolled pain   Complete by: As directed    Call MD for:  temperature >100.4   Complete by: As directed    Diet - low sodium heart healthy   Complete by: As directed    Increase activity slowly   Complete by: As directed      Allergies as of 10/01/2019   No Known Allergies     Medication List    TAKE these medications   acetaminophen 325 MG tablet Commonly known as: TYLENOL Take 325-975  mg by mouth every 6 (six) hours as needed for mild pain or headache.   albuterol 108 (90 Base) MCG/ACT inhaler Commonly known as: VENTOLIN HFA Inhale 1-2 puffs into the lungs every 6 (six) hours as needed for wheezing or shortness of breath.   amLODipine 5 MG tablet Commonly known as: NORVASC Take 1 tablet (5 mg total) by mouth daily.   ammonium lactate 12 % lotion Commonly known as: LAC-HYDRIN Apply 1 application topically 2 (two) times daily as needed (for itching/irritation).   ARIPiprazole 15 MG tablet Commonly known as: ABILIFY Take 7.5 mg by mouth daily.   aspirin 81 MG chewable tablet Chew 1 tablet (81 mg total) by mouth daily.   atorvastatin 40 MG tablet Commonly known as: LIPITOR Take 20 mg by mouth at bedtime.   Auryxia 1 GM 210 MG(Fe) tablet Generic drug: ferric citrate Take 210 mg by mouth daily.   dextrose 40 % Gel Commonly known as: GLUTOSE Take 1 Tube by mouth once as needed (for blood sugar less than 60).   Eliquis 5 MG Tabs tablet Generic drug: apixaban Take 2.5 mg by mouth 2 (two) times daily.   gabapentin 300 MG capsule Commonly known as: NEURONTIN Take 1 capsule (300 mg total) by mouth 2 (two) times daily.   insulin detemir 100 UNIT/ML injection Commonly known as: LEVEMIR Inject 0.08 mLs (8 Units total) into the skin at bedtime. What changed:   how much to take  when to take this   insulin lispro 100 UNIT/ML injection Commonly known as: HUMALOG Inject 3-15 Units into the skin 3 (three) times daily before meals. Pt uses as needed per sliding scale:    150-200:  3  units 201-250:  5 units 251-300:  8 units 301-350:  10 units 351-400:  12 units and call MD 400+ 15 units   isosorbide mononitrate 60 MG 24 hr tablet Commonly known as: IMDUR Take 60 mg by mouth daily.   melatonin 5 MG Tabs Take 10 mg by mouth at bedtime.   Metoprolol Tartrate 75 MG Tabs Take 75 mg by mouth 2 (two) times daily.   multivitamin Tabs tablet Take 1  tablet by mouth at bedtime.   nitroGLYCERIN 0.4 MG SL tablet Commonly known as: NITROSTAT Place 0.4 mg under the tongue every 5 (five) minutes as needed for chest pain.   pantoprazole 20 MG tablet Commonly known as: PROTONIX Take 20 mg by mouth 2 (two) times daily.       No Known Allergies  Consultations:  Nephrology sidelined   Procedures/Studies: DG Chest 2 View  Result Date: 09/30/2019 CLINICAL DATA:  Chest pain EXAM: CHEST - 2 VIEW COMPARISON:  04/14/2019 FINDINGS: Cardiomegaly. No pleural effusion, edema or focal opacity. No pneumothorax. IMPRESSION: Cardiomegaly.  Negative for edema or infiltrate Electronically Signed   By: Donavan Foil M.D.   On: 09/30/2019 18:34   CT Head Wo Contrast  Result Date: 09/30/2019 CLINICAL DATA:  Worsening headache with nausea EXAM: CT  HEAD WITHOUT CONTRAST TECHNIQUE: Contiguous axial images were obtained from the base of the skull through the vertex without intravenous contrast. COMPARISON:  CT brain 01/20/2015, MRI 03/16/2016 FINDINGS: Brain: No acute territorial infarction, hemorrhage or intracranial mass. Stable moderate atrophy. Stable prominent ventricle size. Vascular: No hyperdense vessels. Carotid vascular and vertebral calcification. Skull: Normal. Negative for fracture or focal lesion. Sinuses/Orbits: No acute finding. Other: None IMPRESSION: 1. No CT evidence for acute intracranial abnormality. 2. Atrophy Electronically Signed   By: Donavan Foil M.D.   On: 09/30/2019 23:48      Subjective: No acute issues or events overnight, chest pain resolved, right shoulder pain ongoing but worse with range of motion better with rest, denies chest pain, shortness of breath, nausea, vomiting, diarrhea, constipation, headache, fevers, chills.   Discharge Exam: Vitals:   10/01/19 0409 10/01/19 0730  BP: (!) 155/110 (!) 164/79  Pulse: 62 66  Resp: 20 18  Temp: 97.9 F (36.6 C) 97.8 F (36.6 C)  SpO2: 99% 94%   Vitals:   10/01/19 0200  10/01/19 0215 10/01/19 0409 10/01/19 0730  BP: (!) 151/68 (!) 169/75 (!) 155/110 (!) 164/79  Pulse: (!) 58 61 62 66  Resp: (!) '21 19 20 18  '$ Temp:   97.9 F (36.6 C) 97.8 F (36.6 C)  TempSrc:   Oral Oral  SpO2: 92% 99% 99% 94%  Weight:   75 kg   Height:        General: Pt is alert, awake, not in acute distress Cardiovascular: RRR, S1/S2 +, no rubs, no gallops Respiratory: CTA bilaterally, no wheezing, no rhonchi Abdominal: Soft, NT, ND, bowel sounds + Extremities: no edema, no cyanosis, left upper extremity fistula palpable thrill    The results of significant diagnostics from this hospitalization (including imaging, microbiology, ancillary and laboratory) are listed below for reference.     Microbiology: Recent Results (from the past 240 hour(s))  Respiratory Panel by RT PCR (Flu A&B, Covid) - Nasopharyngeal Swab     Status: None   Collection Time: 10/01/19  2:05 AM   Specimen: Nasopharyngeal Swab  Result Value Ref Range Status   SARS Coronavirus 2 by RT PCR NEGATIVE NEGATIVE Final    Comment: (NOTE) SARS-CoV-2 target nucleic acids are NOT DETECTED. The SARS-CoV-2 RNA is generally detectable in upper respiratoy specimens during the acute phase of infection. The lowest concentration of SARS-CoV-2 viral copies this assay can detect is 131 copies/mL. A negative result does not preclude SARS-Cov-2 infection and should not be used as the sole basis for treatment or other patient management decisions. A negative result may occur with  improper specimen collection/handling, submission of specimen other than nasopharyngeal swab, presence of viral mutation(s) within the areas targeted by this assay, and inadequate number of viral copies (<131 copies/mL). A negative result must be combined with clinical observations, patient history, and epidemiological information. The expected result is Negative. Fact Sheet for Patients:  PinkCheek.be Fact Sheet  for Healthcare Providers:  GravelBags.it This test is not yet ap proved or cleared by the [place] FDA and  has been authorized for detection and/or diagnosis of SARS-CoV-2 by FDA under an Emergency Use Authorization (EUA). This EUA will remain  in effect (meaning this test can be used) for the duration of the COVID-19 declaration under Section 564(b)(1) of the Act, 21 U.S.C. section 360bbb-3(b)(1), unless the authorization is terminated or revoked sooner.    Influenza A by PCR NEGATIVE NEGATIVE Final   Influenza B by PCR NEGATIVE NEGATIVE Final  Comment: (NOTE) The Xpert Xpress SARS-CoV-2/FLU/RSV assay is intended as an aid in  the diagnosis of influenza from Nasopharyngeal swab specimens and  should not be used as a sole basis for treatment. Nasal washings and  aspirates are unacceptable for Xpert Xpress SARS-CoV-2/FLU/RSV  testing. Fact Sheet for Patients: PinkCheek.be Fact Sheet for Healthcare Providers: GravelBags.it This test is not yet approved or cleared by the [place] FDA and  has been authorized for detection and/or diagnosis of SARS-CoV-2 by  FDA under an Emergency Use Authorization (EUA). This EUA will remain  in effect (meaning this test can be used) for the duration of the  Covid-19 declaration under Section 564(b)(1) of the Act, 21  U.S.C. section 360bbb-3(b)(1), unless the authorization is  terminated or revoked. Performed at [facility], [place] [address], [place], [place] [address]      Labs: BNP (last 3 results) No results for input(s): BNP in the last 8760 hours. Basic Metabolic Panel: Recent Labs  Lab 09/30/19 1742 10/01/19 0231 10/01/19 0630  NA 139  --  140  K 3.9  --  3.8  CL 95*  --  97*  CO2 32  --  27  GLUCOSE 201*  --  114*  BUN 20  --  24*  CREATININE 7.14*  --  7.98*  CALCIUM 9.3  --  9.4  MG  --  2.4  --    Liver Function Tests: No  results for input(s): AST, ALT, ALKPHOS, BILITOT, PROT, ALBUMIN in the last 168 hours. No results for input(s): LIPASE, AMYLASE in the last 168 hours. No results for input(s): AMMONIA in the last 168 hours. CBC: Recent Labs  Lab 09/30/19 1742 10/01/19 0630  WBC 6.4 5.0  HGB 10.1* 11.0*  HCT 33.7* 35.1*  MCV 108.0* 106.7*  PLT 176 173   Cardiac Enzymes: No results for input(s): CKTOTAL, CKMB, CKMBINDEX, TROPONINI in the last 168 hours. BNP: Invalid input(s): POCBNP CBG: Recent Labs  Lab 09/30/19 2350 10/01/19 0734  GLUCAP 111* 96   D-Dimer No results for input(s): DDIMER in the last 72 hours. Hgb A1c Recent Labs    10/01/19 0630  HGBA1C 7.9*   Lipid Profile No results for input(s): CHOL, HDL, LDLCALC, TRIG, CHOLHDL, LDLDIRECT in the last 72 hours. Thyroid function studies No results for input(s): TSH, T4TOTAL, T3FREE, THYROIDAB in the last 72 hours.  Invalid input(s): FREET3 Anemia work up No results for input(s): VITAMINB12, FOLATE, FERRITIN, TIBC, IRON, RETICCTPCT in the last 72 hours. Urinalysis    Component Value Date/Time   COLORURINE YELLOW 09/09/2016 0337   APPEARANCEUR HAZY (A) 09/09/2016 0337   LABSPEC 1.010 09/09/2016 0337   PHURINE 5.0 09/09/2016 0337   GLUCOSEU NEGATIVE 09/09/2016 0337   HGBUR NEGATIVE 09/09/2016 0337   BILIRUBINUR NEGATIVE 09/09/2016 0337   KETONESUR NEGATIVE 09/09/2016 0337   PROTEINUR 100 (A) 09/09/2016 0337   UROBILINOGEN 0.2 01/21/2015 0416   NITRITE NEGATIVE 09/09/2016 0337   LEUKOCYTESUR NEGATIVE 09/09/2016 0337   Sepsis Labs Invalid input(s): PROCALCITONIN,  WBC,  LACTICIDVEN Microbiology Recent Results (from the past 240 hour(s))  Respiratory Panel by RT PCR (Flu A&B, Covid) - Nasopharyngeal Swab     Status: None   Collection Time: 10/01/19  2:05 AM   Specimen: Nasopharyngeal Swab  Result Value Ref Range Status   SARS Coronavirus 2 by RT PCR NEGATIVE NEGATIVE Final    Comment: (NOTE) SARS-CoV-2 target nucleic  acids are NOT DETECTED. The SARS-CoV-2 RNA is generally detectable in upper respiratoy specimens  during the acute phase of infection. The lowest concentration of SARS-CoV-2 viral copies this assay can detect is 131 copies/mL. A negative result does not preclude SARS-Cov-2 infection and should not be used as the sole basis for treatment or other patient management decisions. A negative result may occur with  improper specimen collection/handling, submission of specimen other than nasopharyngeal swab, presence of viral mutation(s) within the areas targeted by this assay, and inadequate number of viral copies (<131 copies/mL). A negative result must be combined with clinical observations, patient history, and epidemiological information. The expected result is Negative. Fact Sheet for Patients:  PinkCheek.be Fact Sheet for Healthcare Providers:  GravelBags.it This test is not yet ap proved or cleared by the [place] FDA and  has been authorized for detection and/or diagnosis of SARS-CoV-2 by FDA under an Emergency Use Authorization (EUA). This EUA will remain  in effect (meaning this test can be used) for the duration of the COVID-19 declaration under Section 564(b)(1) of the Act, 21 U.S.C. section 360bbb-3(b)(1), unless the authorization is terminated or revoked sooner.    Influenza A by PCR NEGATIVE NEGATIVE Final   Influenza B by PCR NEGATIVE NEGATIVE Final    Comment: (NOTE) The Xpert Xpress SARS-CoV-2/FLU/RSV assay is intended as an aid in  the diagnosis of influenza from Nasopharyngeal swab specimens and  should not be used as a sole basis for treatment. Nasal washings and  aspirates are unacceptable for Xpert Xpress SARS-CoV-2/FLU/RSV  testing. Fact Sheet for Patients: PinkCheek.be Fact Sheet for Healthcare Providers: GravelBags.it This test is not yet  approved or cleared by the [place] FDA and  has been authorized for detection and/or diagnosis of SARS-CoV-2 by  FDA under an Emergency Use Authorization (EUA). This EUA will remain  in effect (meaning this test can be used) for the duration of the  Covid-19 declaration under Section 564(b)(1) of the Act, 21  U.S.C. section 360bbb-3(b)(1), unless the authorization is  terminated or revoked. Performed at [facility], [place] [address], [place], [place] [address]      Time coordinating discharge: Over 30 minutes  SIGNED:   Little Ishikawa, DO [facility] 10/01/2019, 10:53 AM

## 2019-10-01 NOTE — ED Notes (Signed)
Pt reports tingling on left side of face that started within the last hour. Pt's right hand is visibly shaking. Pt reports sensation od same and different of left side of face and arms as being different on the left side as different. Pt describes it as tingly. MD notified.

## 2019-11-18 ENCOUNTER — Ambulatory Visit (INDEPENDENT_AMBULATORY_CARE_PROVIDER_SITE_OTHER): Payer: Medicare Other

## 2019-11-18 ENCOUNTER — Encounter: Payer: Self-pay | Admitting: Family

## 2019-11-18 ENCOUNTER — Other Ambulatory Visit: Payer: Self-pay

## 2019-11-18 ENCOUNTER — Ambulatory Visit (INDEPENDENT_AMBULATORY_CARE_PROVIDER_SITE_OTHER): Payer: Medicare Other | Admitting: Physician Assistant

## 2019-11-18 VITALS — Ht 64.0 in | Wt 154.0 lb

## 2019-11-18 DIAGNOSIS — M25572 Pain in left ankle and joints of left foot: Secondary | ICD-10-CM

## 2019-11-18 DIAGNOSIS — I200 Unstable angina: Secondary | ICD-10-CM

## 2019-11-18 MED ORDER — HYDROCODONE-ACETAMINOPHEN 5-325 MG PO TABS: 1.0000 | ORAL_TABLET | Freq: Four times a day (QID) | ORAL | 0 refills | Status: DC | PRN

## 2019-11-18 NOTE — Progress Notes (Signed)
Office Visit Note   Patient: Robin [name]           Date of Birth: 1951-03-11           MRN: [id number] Visit Date: 11/18/2019              Requested by: Center, [facility] [address] [place],  [place] [address] PCP: Center, [place]  Chief Complaint  Patient presents with  . Left Foot - Pain  . Left Ankle - Pain      HPI: The patient is 6 years status left tibial calcaneal fusion secondary to Charcot arthropathy.  She is also status post ORIF of this ankle prior to that.  She states she woke up this morning and tried to bear weight on her left ankle and had immediate onset of pain and fell to the floor.  She denies any other injuries but points to pain specifically over her left ankle joint.  She denies any fever chills  Assessment & Plan: Visit Diagnoses:  1. Pain in left ankle and joints of left foot     Plan: Status post left tibial calcaneal fusion.  There are no sound signs of Charcot inflammation with regards to redness warmth or swelling.  We will place her in a cam boot and she should remain nonweightbearing over the weekend.  I called her in some hydrocodone.  She wishes to follow-up with Dr. Sharol Given as soon as possible.  I will have her follow-up with him on Monday  Follow-Up Instructions: No follow-ups on file.   Ortho Exam  Patient is alert, oriented, no adenopathy, well-dressed, normal affect, normal respiratory effort. Left ankle: Healed surgical incisions.  She does have some prominent hardware on the medial side of the ankle which she does not recall having prior to today.  Skin however is intact.  Distal pulses in her foot are easily palpable.  She has no pain with manipulation of her midfoot or her toes.  She does have some tenderness to palpation over the ankle joint globally.  No cellulitis no warmth  Imaging: XR Ankle Complete Left  Result Date: 11/18/2019 2 views of her left ankle were taken today.  She is status post calcaneal tibial fusion.   She does have a rod in place.  Question some haloing around the rod however this was also present on her previous x-rays 3 years ago hardware intact and in place with some medial prominence of the hardware perhaps some slight shifting of the joint  XR Foot Complete Left  Result Date: 11/18/2019 X-rays of her foot were taken today.  They do not show any acute osseous injuries overall well-maintained alignment  No images are attached to the encounter.  Labs: Lab Results  Component Value Date   HGBA1C 7.9 (H) 10/01/2019   HGBA1C 10.0 (H) 04/15/2019   HGBA1C 6.7 (H) 09/09/2016   LABURIC 5.4 12/01/2009   LABURIC 5.5 11/30/2009   REPTSTATUS 08/13/2016 FINAL 08/07/2016   GRAMSTAIN  03/14/2016    MODERATE WBC PRESENT,BOTH PMN AND MONONUCLEAR NO ORGANISMS SEEN    CULT  08/07/2016    NO GROWTH 5 DAYS Performed at [facility], [place] [address] [place], [place] [address]      Lab Results  Component Value Date   ALBUMIN 3.0 (L) 04/17/2019   ALBUMIN 3.3 (L) 04/15/2019   ALBUMIN 3.1 (L) 12/28/2017   LABURIC 5.4 12/01/2009   LABURIC 5.5 11/30/2009    Lab Results  Component  Value Date   MG 2.4 10/01/2019   MG 2.1 04/14/2019   MG 2.0 12/03/2015   No results found for: VD25OH  No results found for: PREALBUMIN CBC EXTENDED Latest Ref Rng & Units 10/01/2019 09/30/2019 04/17/2019  WBC 4.0 - 10.5 K/uL 5.0 6.4 5.1  RBC 3.87 - 5.11 MIL/uL 3.29(L) 3.12(L) 3.22(L)  HGB 12.0 - 15.0 g/dL 11.0(L) 10.1(L) 10.6(L)  HCT 36 - 46 % 35.1(L) 33.7(L) 32.5(L)  PLT 150 - 400 K/uL 173 176 148(L)  NEUTROABS 1.7 - 7.7 K/uL - - 3.6  LYMPHSABS 0.7 - 4.0 K/uL - - 0.8     Body mass index is 26.43 kg/m.  Orders:  Orders Placed This Encounter  Procedures  . XR Ankle Complete Left  . XR Foot Complete Left   Meds ordered this encounter  Medications  . HYDROcodone-acetaminophen (NORCO) 5-325 MG tablet    Sig: Take 1 tablet by mouth every 6 (six) hours as needed.    Dispense:  20 tablet     Refill:  0     Procedures: No procedures performed  Clinical Data: No additional findings.  ROS:  All other systems negative, except as noted in the HPI. Review of Systems  Objective: Vital Signs: Ht 5\' 4"  (1.626 m)   Wt 154 lb (69.9 kg)   BMI 26.43 kg/m   Specialty Comments:  No specialty comments available.  PMFS History: Patient Active Problem List   Diagnosis Date Noted  . Chest pain 10/01/2019  . History of pulmonary embolism 10/01/2019  . Prolonged QT interval 10/01/2019  . Hypertensive urgency   . Other headache syndrome   . Orthostatic dizziness 04/15/2019  . Bradycardia 04/15/2019  . Syncope   . ESRD on hemodialysis ([place])   . Chest pain at rest 09/16/2018  . Diabetic Charct's arthropathy ([place]) 10/07/2016  . GERD (gastroesophageal reflux disease) 09/09/2016  . Depression 09/09/2016  . Chronic diastolic (congestive) heart failure ([place]) 09/09/2016  . Elevated troponin 09/09/2016  . Hyperlipidemia associated with type 2 diabetes mellitus ([place]) 03/26/2015  . Charcot ankle 03/16/2015  . Anemia, chronic renal failure   . NSVT (nonsustained ventricular tachycardia) ([place])   . Hypertension due to end stage renal disease caused by type 2 diabetes mellitus, on dialysis ([place]) 12/09/2014  . Bipolar affective disorder ([place]) 12/09/2014  . Diabetes mellitus type 2, insulin dependent ([place]) 11/17/2014  . CAD (coronary artery disease), native coronary artery with 2 stents  11/17/2014   Past Medical History:  Diagnosis Date  . Acute delirium 11/18/2014  . Acute encephalopathy   . Acute on chronic respiratory failure with hypoxia ([place]) 09/09/2016  . Acute respiratory failure with hypoxia ([place]) 11/29/2015  . Anemia in chronic kidney disease 12/09/2014  . Anxiety   . Arthritis   . Benign hypertension   . Bipolar affective disorder ([place]) 12/09/2014  . CAD (coronary artery disease), native coronary artery with 2 stents  11/17/2014  . Charcot foot due to diabetes mellitus ([place])    . Chronic combined systolic and diastolic CHF (congestive heart failure) ([place]) 11/18/2014  . Closed left ankle fracture 11/17/2014  . Confusion 01/21/2015  . Depression   . Diabetes mellitus without complication ([place])   . End stage renal disease on dialysis ([place])   . Fracture dislocation of ankle 11/17/2014  . GERD (gastroesophageal reflux disease)   . Heart murmur   . History of blood transfusion   . History of bronchitis   . History of pneumonia   . Hyperlipidemia 03/26/2015  .  Hypertension associated with diabetes ([place]) 11/18/2014  . Hypertensive heart/renal disease with failure ([place]) 12/09/2014  . Hypokalemia 11/17/2014  . Multiple falls 01/21/2015  . Obesity 11/17/2014  . Onychomycosis 10/07/2016  . Right leg DVT ([place]) 12/05/2015  . SIRS (systemic inflammatory response syndrome) ([place]) 08/08/2016  . Sleep apnea   . Unstable angina ([place]) 12/26/2017  . Ventricular tachycardia ([place])     Family History  Family history unknown: Yes    Past Surgical History:  Procedure Laterality Date  . ABDOMINAL HYSTERECTOMY    . ANKLE CLOSED REDUCTION N/A 11/17/2014   Procedure: CLOSED REDUCTION ANKLE;  Surgeon: Earlie Server, MD;  Location: [facility];  Service: Orthopedics;  Laterality: N/A;  . ANKLE FUSION Left 03/16/2015   Procedure: Left Tibiocalcaneal Fusion;  Surgeon: Newt Minion, MD;  Location: [facility];  Service: Orthopedics;  Laterality: Left;  . APPLICATION OF WOUND VAC Right 08/06/2016   Procedure: APPLICATION OF PREVENA WOUND VAC;  Surgeon: Newt Minion, MD;  Location: [facility];  Service: Orthopedics;  Laterality: Right;  . AV FISTULA PLACEMENT Left [id number]   done at [facility]     2 stent   . CHOLECYSTECTOMY    . CORONARY STENT PLACEMENT    . FOOT ARTHRODESIS Right 08/06/2016   Procedure: Right Foot Fusion Lisfranc Joint;  Surgeon: Newt Minion, MD;  Location: [facility];  Service: Orthopedics;  Laterality: Right;  . HARDWARE REMOVAL Left 03/16/2015   Procedure: Removal  Hardware Left Ankle;  Surgeon: Newt Minion, MD;  Location: [facility];  Service: Orthopedics;  Laterality: Left;  . IR AV DIALY SHUNT INTRO NEEDLE/INTRACATH INITIAL W/PTA/IMG LEFT  01/05/2018  . IR US GUIDE VASC ACCESS LEFT  01/05/2018  . LEFT HEART CATH AND CORONARY ANGIOGRAPHY N/A 12/28/2017   Procedure: LEFT HEART CATH AND CORONARY ANGIOGRAPHY;  Surgeon: Burnell Blanks, MD;  Location: [facility];  Service: Cardiovascular;  Laterality: N/A;  . ORIF ANKLE FRACTURE Left 11/20/2014   Procedure: OPEN REDUCTION INTERNAL FIXATION (ORIF) ANKLE FRACTURE;  Surgeon: Renette Butters, MD;  Location: [facility];  Service: Orthopedics;  Laterality: Left;  . TONSILLECTOMY     Social History   Occupational History  . Not on file  Tobacco Use  . Smoking status: Current Every Day Smoker    Packs/day: 1.00    Types: Cigarettes  . Smokeless tobacco: Never Used  Substance and Sexual Activity  . Alcohol use: No  . Drug use: No  . Sexual activity: Never

## 2019-11-21 ENCOUNTER — Other Ambulatory Visit: Payer: Self-pay

## 2019-11-21 ENCOUNTER — Ambulatory Visit (INDEPENDENT_AMBULATORY_CARE_PROVIDER_SITE_OTHER): Payer: Medicare Other | Admitting: Orthopedic Surgery

## 2019-11-21 ENCOUNTER — Encounter: Payer: Self-pay | Admitting: Orthopedic Surgery

## 2019-11-21 VITALS — Ht 64.0 in | Wt 154.0 lb

## 2019-11-21 DIAGNOSIS — I200 Unstable angina: Secondary | ICD-10-CM

## 2019-11-21 DIAGNOSIS — M7662 Achilles tendinitis, left leg: Secondary | ICD-10-CM

## 2019-11-21 DIAGNOSIS — M25572 Pain in left ankle and joints of left foot: Secondary | ICD-10-CM | POA: Diagnosis not present

## 2019-11-21 DIAGNOSIS — I872 Venous insufficiency (chronic) (peripheral): Secondary | ICD-10-CM

## 2019-11-23 ENCOUNTER — Ambulatory Visit: Payer: Self-pay

## 2019-11-23 ENCOUNTER — Ambulatory Visit (INDEPENDENT_AMBULATORY_CARE_PROVIDER_SITE_OTHER): Payer: Medicare Other | Admitting: Physician Assistant

## 2019-11-23 ENCOUNTER — Encounter: Payer: Self-pay | Admitting: Orthopedic Surgery

## 2019-11-23 ENCOUNTER — Encounter: Payer: Self-pay | Admitting: Family

## 2019-11-23 VITALS — Ht 64.0 in | Wt 154.0 lb

## 2019-11-23 DIAGNOSIS — M79642 Pain in left hand: Secondary | ICD-10-CM | POA: Diagnosis not present

## 2019-11-23 DIAGNOSIS — M25572 Pain in left ankle and joints of left foot: Secondary | ICD-10-CM | POA: Diagnosis not present

## 2019-11-23 DIAGNOSIS — I200 Unstable angina: Secondary | ICD-10-CM

## 2019-11-23 LAB — URIC ACID: Uric Acid, Serum: 4.3 mg/dL (ref 2.5–7.0)

## 2019-11-23 NOTE — Progress Notes (Signed)
Office Visit Note   Patient: Robin [name]           Date of Birth: September 10, 1950           MRN: [id number] Visit Date: 11/23/2019              Requested by: Center, [facility] [address] [place],  [place] [address] PCP: Center, [place]  Chief Complaint  Patient presents with  . Left Ankle - Pain      HPI: This is a 69 year old woman who follows up for her left ankle pain.  She is status post pantalar fusion with a rod 5 or 6 years ago.  She had acute onset of pain without trauma when she got out of bed approximately a week ago.  She is still feeling about the same may be slightly better.  She still is using a wheelchair and immobilized in a cam walker boot.  She was seen by Dr. Sharol Given who ordered a uric acid which was normal  Assessment & Plan: Visit Diagnoses:  1. Pain in left hand   2. Pain in left ankle and joints of left foot     Plan: We will order a CT of the left ankle to evaluate the union.  Also sed rate CRP and CBC to rule out any infective process.  She should follow-up with Dr. Sharol Given once these are completed  Follow-Up Instructions: No follow-ups on file.   Ortho Exam  Patient is alert, oriented, no adenopathy, well-dressed, normal affect, normal respiratory effort. Left ankle: Still tender to palpation globally around the ankle.  No erythema no cellulitis  Imaging: No results found. No images are attached to the encounter.  Labs: Lab Results  Component Value Date   HGBA1C 7.9 (H) 10/01/2019   HGBA1C 10.0 (H) 04/15/2019   HGBA1C 6.7 (H) 09/09/2016   LABURIC 4.3 11/22/2019   LABURIC 5.4 12/01/2009   LABURIC 5.5 11/30/2009   REPTSTATUS 08/13/2016 FINAL 08/07/2016   GRAMSTAIN  03/14/2016    MODERATE WBC PRESENT,BOTH PMN AND MONONUCLEAR NO ORGANISMS SEEN    CULT  08/07/2016    NO GROWTH 5 DAYS Performed at [facility], [place] [address] [place], [place] [address]      Lab Results  Component Value Date   ALBUMIN 3.0 (L) 04/17/2019    ALBUMIN 3.3 (L) 04/15/2019   ALBUMIN 3.1 (L) 12/28/2017   LABURIC 4.3 11/22/2019   LABURIC 5.4 12/01/2009   LABURIC 5.5 11/30/2009    Lab Results  Component Value Date   MG 2.4 10/01/2019   MG 2.1 04/14/2019   MG 2.0 12/03/2015   No results found for: VD25OH  No results found for: PREALBUMIN CBC EXTENDED Latest Ref Rng & Units 10/01/2019 09/30/2019 04/17/2019  WBC 4.0 - 10.5 K/uL 5.0 6.4 5.1  RBC 3.87 - 5.11 MIL/uL 3.29(L) 3.12(L) 3.22(L)  HGB 12.0 - 15.0 g/dL 11.0(L) 10.1(L) 10.6(L)  HCT 36 - 46 % 35.1(L) 33.7(L) 32.5(L)  PLT 150 - 400 K/uL 173 176 148(L)  NEUTROABS 1.7 - 7.7 K/uL - - 3.6  LYMPHSABS 0.7 - 4.0 K/uL - - 0.8     Body mass index is 26.43 kg/m.  Orders:  Orders Placed This Encounter  Procedures  . XR Hand Complete Left  . XR Hand Complete Right  . CT ANKLE LEFT WO CONTRAST   No orders of the defined types were placed in this encounter.    Procedures: No procedures performed  Clinical Data:  No additional findings.  ROS:  All other systems negative, except as noted in the HPI. Review of Systems  Objective: Vital Signs: Ht 5\' 4"  (1.626 m)   Wt 154 lb (69.9 kg)   BMI 26.43 kg/m   Specialty Comments:  No specialty comments available.  PMFS History: Patient Active Problem List   Diagnosis Date Noted  . Chest pain 10/01/2019  . History of pulmonary embolism 10/01/2019  . Prolonged QT interval 10/01/2019  . Hypertensive urgency   . Other headache syndrome   . Orthostatic dizziness 04/15/2019  . Bradycardia 04/15/2019  . Syncope   . ESRD on hemodialysis ([place])   . Chest pain at rest 09/16/2018  . Diabetic Charct's arthropathy ([place]) 10/07/2016  . GERD (gastroesophageal reflux disease) 09/09/2016  . Depression 09/09/2016  . Chronic diastolic (congestive) heart failure ([place]) 09/09/2016  . Elevated troponin 09/09/2016  . Hyperlipidemia associated with type 2 diabetes mellitus ([place]) 03/26/2015  . Charcot ankle 03/16/2015  . Anemia, chronic  renal failure   . NSVT (nonsustained ventricular tachycardia) ([place])   . Hypertension due to end stage renal disease caused by type 2 diabetes mellitus, on dialysis ([place]) 12/09/2014  . Bipolar affective disorder ([place]) 12/09/2014  . Diabetes mellitus type 2, insulin dependent ([place]) 11/17/2014  . CAD (coronary artery disease), native coronary artery with 2 stents  11/17/2014   Past Medical History:  Diagnosis Date  . Acute delirium 11/18/2014  . Acute encephalopathy   . Acute on chronic respiratory failure with hypoxia ([place]) 09/09/2016  . Acute respiratory failure with hypoxia ([place]) 11/29/2015  . Anemia in chronic kidney disease 12/09/2014  . Anxiety   . Arthritis   . Benign hypertension   . Bipolar affective disorder ([place]) 12/09/2014  . CAD (coronary artery disease), native coronary artery with 2 stents  11/17/2014  . Charcot foot due to diabetes mellitus ([place])   . Chronic combined systolic and diastolic CHF (congestive heart failure) ([place]) 11/18/2014  . Closed left ankle fracture 11/17/2014  . Confusion 01/21/2015  . Depression   . Diabetes mellitus without complication ([place])   . End stage renal disease on dialysis ([place])   . Fracture dislocation of ankle 11/17/2014  . GERD (gastroesophageal reflux disease)   . Heart murmur   . History of blood transfusion   . History of bronchitis   . History of pneumonia   . Hyperlipidemia 03/26/2015  . Hypertension associated with diabetes ([place]) 11/18/2014  . Hypertensive heart/renal disease with failure ([place]) 12/09/2014  . Hypokalemia 11/17/2014  . Multiple falls 01/21/2015  . Obesity 11/17/2014  . Onychomycosis 10/07/2016  . Right leg DVT ([place]) 12/05/2015  . SIRS (systemic inflammatory response syndrome) ([place]) 08/08/2016  . Sleep apnea   . Unstable angina ([place]) 12/26/2017  . Ventricular tachycardia ([place])     Family History  Family history unknown: Yes    Past Surgical History:  Procedure Laterality Date  . ABDOMINAL HYSTERECTOMY    . ANKLE CLOSED  REDUCTION N/A 11/17/2014   Procedure: CLOSED REDUCTION ANKLE;  Surgeon: Earlie Server, MD;  Location: [facility];  Service: Orthopedics;  Laterality: N/A;  . ANKLE FUSION Left 03/16/2015   Procedure: Left Tibiocalcaneal Fusion;  Surgeon: Newt Minion, MD;  Location: [facility];  Service: Orthopedics;  Laterality: Left;  . APPLICATION OF WOUND VAC Right 08/06/2016   Procedure: APPLICATION OF PREVENA WOUND VAC;  Surgeon: Newt Minion, MD;  Location: [facility];  Service: Orthopedics;  Laterality: Right;  . AV FISTULA PLACEMENT Left may-2016  done at [facility]     2 stent   . CHOLECYSTECTOMY    . CORONARY STENT PLACEMENT    . FOOT ARTHRODESIS Right 08/06/2016   Procedure: Right Foot Fusion Lisfranc Joint;  Surgeon: Newt Minion, MD;  Location: [facility];  Service: Orthopedics;  Laterality: Right;  . HARDWARE REMOVAL Left 03/16/2015   Procedure: Removal Hardware Left Ankle;  Surgeon: Newt Minion, MD;  Location: [facility];  Service: Orthopedics;  Laterality: Left;  . IR AV DIALY SHUNT INTRO NEEDLE/INTRACATH INITIAL W/PTA/IMG LEFT  01/05/2018  . IR US GUIDE VASC ACCESS LEFT  01/05/2018  . LEFT HEART CATH AND CORONARY ANGIOGRAPHY N/A 12/28/2017   Procedure: LEFT HEART CATH AND CORONARY ANGIOGRAPHY;  Surgeon: Burnell Blanks, MD;  Location: [facility];  Service: Cardiovascular;  Laterality: N/A;  . ORIF ANKLE FRACTURE Left 11/20/2014   Procedure: OPEN REDUCTION INTERNAL FIXATION (ORIF) ANKLE FRACTURE;  Surgeon: Renette Butters, MD;  Location: [facility];  Service: Orthopedics;  Laterality: Left;  . TONSILLECTOMY     Social History   Occupational History  . Not on file  Tobacco Use  . Smoking status: Current Every Day Smoker    Packs/day: 1.00    Types: Cigarettes  . Smokeless tobacco: Never Used  Substance and Sexual Activity  . Alcohol use: No  . Drug use: No  . Sexual activity: Never

## 2019-11-23 NOTE — Progress Notes (Signed)
Office Visit Note   Patient: Robin [name]           Date of Birth: 27-Aug-1950           MRN: [id number] Visit Date: 11/21/2019              Requested by: Center, [facility] [address] [place],  [place] [address] PCP: Center, [place]  Chief Complaint  Patient presents with  . Left Foot - Pain, Follow-up  . Left Ankle - Pain, Follow-up      HPI: Patient is a 69 year old woman diabetic end-stage renal disease on dialysis with neuropathy currently on Neurontin who complains of global pain around the foot and ankle.  She is status post right foot surgery in 2018 as well as left ankle hardware removal in 2016.  Patient currently takes Vicodin for her chronic pain patient complains of swelling around the ankle and that her most pain is in her Achilles tendon.  Assessment & Plan: Visit Diagnoses:  1. Pain in left ankle and joints of left foot   2. Venous insufficiency (chronic) (peripheral)     Plan: Recommend patient use her compression stockings she is given a 9/16 inch heel lift in her boot to help unload pressure on the Achilles  Follow-Up Instructions: Return in about 4 weeks (around 12/19/2019).   Ortho Exam  Patient is alert, oriented, no adenopathy, well-dressed, normal affect, normal respiratory effort. Examination patient has pitting edema in both lower extremities worse in the left than the right there are no open ulcers.  Recently patient has had a Doppler that was negative for DVT.  Patient is just started Eliquis.  The Doppler was used and she has a good triphasic posterior tibial and biphasic dorsalis pedis pulse.  She is tender to palpation along the Achilles.  No palpable defect no cystic changes.  Imaging: No results found. No images are attached to the encounter.  Labs: Lab Results  Component Value Date   HGBA1C 7.9 (H) 10/01/2019   HGBA1C 10.0 (H) 04/15/2019   HGBA1C 6.7 (H) 09/09/2016   LABURIC 4.3 11/22/2019   LABURIC 5.4 12/01/2009   LABURIC  5.5 11/30/2009   REPTSTATUS 08/13/2016 FINAL 08/07/2016   GRAMSTAIN  03/14/2016    MODERATE WBC PRESENT,BOTH PMN AND MONONUCLEAR NO ORGANISMS SEEN    CULT  08/07/2016    NO GROWTH 5 DAYS Performed at [facility], [facility] [address] [place], [place] [address]      Lab Results  Component Value Date   ALBUMIN 3.0 (L) 04/17/2019   ALBUMIN 3.3 (L) 04/15/2019   ALBUMIN 3.1 (L) 12/28/2017   LABURIC 4.3 11/22/2019   LABURIC 5.4 12/01/2009   LABURIC 5.5 11/30/2009    Lab Results  Component Value Date   MG 2.4 10/01/2019   MG 2.1 04/14/2019   MG 2.0 12/03/2015   No results found for: VD25OH  No results found for: PREALBUMIN CBC EXTENDED Latest Ref Rng & Units 10/01/2019 09/30/2019 04/17/2019  WBC 4.0 - 10.5 K/uL 5.0 6.4 5.1  RBC 3.87 - 5.11 MIL/uL 3.29(L) 3.12(L) 3.22(L)  HGB 12.0 - 15.0 g/dL 11.0(L) 10.1(L) 10.6(L)  HCT 36 - 46 % 35.1(L) 33.7(L) 32.5(L)  PLT 150 - 400 K/uL 173 176 148(L)  NEUTROABS 1.7 - 7.7 K/uL - - 3.6  LYMPHSABS 0.7 - 4.0 K/uL - - 0.8     Body mass index is 26.43 kg/m.  Orders:  Orders Placed This Encounter  Procedures  . Uric  acid   No orders of the defined types were placed in this encounter.    Procedures: No procedures performed  Clinical Data: No additional findings.  ROS:  All other systems negative, except as noted in the HPI. Review of Systems  Objective: Vital Signs: Ht 5\' 4"  (1.626 m)   Wt 154 lb (69.9 kg)   BMI 26.43 kg/m   Specialty Comments:  No specialty comments available.  PMFS History: Patient Active Problem List   Diagnosis Date Noted  . Chest pain 10/01/2019  . History of pulmonary embolism 10/01/2019  . Prolonged QT interval 10/01/2019  . Hypertensive urgency   . Other headache syndrome   . Orthostatic dizziness 04/15/2019  . Bradycardia 04/15/2019  . Syncope   . ESRD on hemodialysis ([place])   . Chest pain at rest 09/16/2018  . Diabetic Charct's arthropathy ([place]) 10/07/2016  . GERD (gastroesophageal  reflux disease) 09/09/2016  . Depression 09/09/2016  . Chronic diastolic (congestive) heart failure ([place]) 09/09/2016  . Elevated troponin 09/09/2016  . Hyperlipidemia associated with type 2 diabetes mellitus ([place]) 03/26/2015  . Charcot ankle 03/16/2015  . Anemia, chronic renal failure   . NSVT (nonsustained ventricular tachycardia) ([place])   . Hypertension due to end stage renal disease caused by type 2 diabetes mellitus, on dialysis ([place]) 12/09/2014  . Bipolar affective disorder ([place]) 12/09/2014  . Diabetes mellitus type 2, insulin dependent ([place]) 11/17/2014  . CAD (coronary artery disease), native coronary artery with 2 stents  11/17/2014   Past Medical History:  Diagnosis Date  . Acute delirium 11/18/2014  . Acute encephalopathy   . Acute on chronic respiratory failure with hypoxia ([place]) 09/09/2016  . Acute respiratory failure with hypoxia ([place]) 11/29/2015  . Anemia in chronic kidney disease 12/09/2014  . Anxiety   . Arthritis   . Benign hypertension   . Bipolar affective disorder ([place]) 12/09/2014  . CAD (coronary artery disease), native coronary artery with 2 stents  11/17/2014  . Charcot foot due to diabetes mellitus ([place])   . Chronic combined systolic and diastolic CHF (congestive heart failure) ([place]) 11/18/2014  . Closed left ankle fracture 11/17/2014  . Confusion 01/21/2015  . Depression   . Diabetes mellitus without complication ([place])   . End stage renal disease on dialysis ([place])   . Fracture dislocation of ankle 11/17/2014  . GERD (gastroesophageal reflux disease)   . Heart murmur   . History of blood transfusion   . History of bronchitis   . History of pneumonia   . Hyperlipidemia 03/26/2015  . Hypertension associated with diabetes ([place]) 11/18/2014  . Hypertensive heart/renal disease with failure ([place]) 12/09/2014  . Hypokalemia 11/17/2014  . Multiple falls 01/21/2015  . Obesity 11/17/2014  . Onychomycosis 10/07/2016  . Right leg DVT ([place]) 12/05/2015  . SIRS (systemic inflammatory  response syndrome) ([place]) 08/08/2016  . Sleep apnea   . Unstable angina ([place]) 12/26/2017  . Ventricular tachycardia ([place])     Family History  Family history unknown: Yes    Past Surgical History:  Procedure Laterality Date  . ABDOMINAL HYSTERECTOMY    . ANKLE CLOSED REDUCTION N/A 11/17/2014   Procedure: CLOSED REDUCTION ANKLE;  Surgeon: Earlie Server, MD;  Location: [facility];  Service: Orthopedics;  Laterality: N/A;  . ANKLE FUSION Left 03/16/2015   Procedure: Left Tibiocalcaneal Fusion;  Surgeon: Newt Minion, MD;  Location: [facility];  Service: Orthopedics;  Laterality: Left;  . APPLICATION OF WOUND VAC Right 08/06/2016   Procedure: APPLICATION OF PREVENA WOUND VAC;  Surgeon: Newt Minion, MD;  Location: [facility];  Service: Orthopedics;  Laterality: Right;  . AV FISTULA PLACEMENT Left [id number]   done at [facility]     2 stent   . CHOLECYSTECTOMY    . CORONARY STENT PLACEMENT    . FOOT ARTHRODESIS Right 08/06/2016   Procedure: Right Foot Fusion Lisfranc Joint;  Surgeon: Newt Minion, MD;  Location: [facility];  Service: Orthopedics;  Laterality: Right;  . HARDWARE REMOVAL Left 03/16/2015   Procedure: Removal Hardware Left Ankle;  Surgeon: Newt Minion, MD;  Location: [facility];  Service: Orthopedics;  Laterality: Left;  . IR AV DIALY SHUNT INTRO NEEDLE/INTRACATH INITIAL W/PTA/IMG LEFT  01/05/2018  . IR US GUIDE VASC ACCESS LEFT  01/05/2018  . LEFT HEART CATH AND CORONARY ANGIOGRAPHY N/A 12/28/2017   Procedure: LEFT HEART CATH AND CORONARY ANGIOGRAPHY;  Surgeon: Burnell Blanks, MD;  Location: [facility];  Service: Cardiovascular;  Laterality: N/A;  . ORIF ANKLE FRACTURE Left 11/20/2014   Procedure: OPEN REDUCTION INTERNAL FIXATION (ORIF) ANKLE FRACTURE;  Surgeon: Renette Butters, MD;  Location: [facility];  Service: Orthopedics;  Laterality: Left;  . TONSILLECTOMY     Social History   Occupational History  . Not on file  Tobacco Use  . Smoking status: Current  Every Day Smoker    Packs/day: 1.00    Types: Cigarettes  . Smokeless tobacco: Never Used  Substance and Sexual Activity  . Alcohol use: No  . Drug use: No  . Sexual activity: Never

## 2019-11-24 LAB — CBC WITH DIFFERENTIAL/PLATELET
Absolute Monocytes: 551 cells/uL (ref 200–950)
Basophils Absolute: 62 cells/uL (ref 0–200)
Basophils Relative: 1.2 %
Eosinophils Absolute: 99 cells/uL (ref 15–500)
Eosinophils Relative: 1.9 %
HCT: 30.5 % — ABNORMAL LOW (ref 35.0–45.0)
Hemoglobin: 10 g/dL — ABNORMAL LOW (ref 11.7–15.5)
Lymphs Abs: 1180 cells/uL (ref 850–3900)
MCH: 32.4 pg (ref 27.0–33.0)
MCHC: 32.8 g/dL (ref 32.0–36.0)
MCV: 98.7 fL (ref 80.0–100.0)
MPV: 11.5 fL (ref 7.5–12.5)
Monocytes Relative: 10.6 %
Neutro Abs: 3307 cells/uL (ref 1500–7800)
Neutrophils Relative %: 63.6 %
Platelets: 174 10*3/uL (ref 140–400)
RBC: 3.09 10*6/uL — ABNORMAL LOW (ref 3.80–5.10)
RDW: 15.2 % — ABNORMAL HIGH (ref 11.0–15.0)
Total Lymphocyte: 22.7 %
WBC: 5.2 10*3/uL (ref 3.8–10.8)

## 2019-11-24 LAB — SEDIMENTATION RATE: Sed Rate: 17 mm/h (ref 0–30)

## 2019-11-24 LAB — C-REACTIVE PROTEIN: CRP: 21.9 mg/L — ABNORMAL HIGH (ref <8.0)

## 2019-12-09 ENCOUNTER — Telehealth: Payer: Self-pay

## 2019-12-09 NOTE — Telephone Encounter (Signed)
-----   Message from Loren Racer, RN sent at 08/12/2019  2:21 PM EDT ----- Regarding: RE: Event Monitor There's a note on the monitor order that it was mailed on 12/8.  She had syncope so she does need to wear the monitor.  She also needs a f/u appt in the office.    Anderson Malta ----- Message ----- From: Carylon Perches, CMA Sent: 08/12/2019   1:56 PM EDT To: Loren Racer, RN Subject: Event Monitor                                  [facility]  [facility] ordered a monitor for this pt back in November for NSVT. The pt didn't answer the phone when [employer] called to verify her address so the monitor was never mailed to her. Can you find out if Dr Tamala Julian would still like for her to wear this so I can try to get it sent to her.   I don't see that he has ever seen her but he's listed as her Cardiologist so I'm not sure what to do...   Thanks, Makenlee Mckeag G.

## 2019-12-09 NOTE — Telephone Encounter (Signed)
I was finally able to get in touch with the pt. She stated that she was f/u at the [facility] and they are taking care of her. I advised her to contact us if she needs [place] in the future.

## 2019-12-19 ENCOUNTER — Ambulatory Visit: Payer: Medicare Other | Admitting: Orthopedic Surgery

## 2019-12-20 ENCOUNTER — Ambulatory Visit
Admission: RE | Admit: 2019-12-20 | Discharge: 2019-12-20 | Disposition: A | Discharge status: Home / Self Care | Payer: Medicare Other | Source: Ambulatory Visit | Attending: Physician Assistant | Admitting: Physician Assistant

## 2019-12-20 DIAGNOSIS — M25572 Pain in left ankle and joints of left foot: Secondary | ICD-10-CM

## 2019-12-20 DIAGNOSIS — R600 Localized edema: Secondary | ICD-10-CM | POA: Diagnosis not present

## 2019-12-22 ENCOUNTER — Other Ambulatory Visit: Payer: Self-pay

## 2019-12-22 ENCOUNTER — Ambulatory Visit (INDEPENDENT_AMBULATORY_CARE_PROVIDER_SITE_OTHER): Payer: Medicare Other | Admitting: Orthopedic Surgery

## 2019-12-22 DIAGNOSIS — E11610 Type 2 diabetes mellitus with diabetic neuropathic arthropathy: Secondary | ICD-10-CM

## 2019-12-22 DIAGNOSIS — I200 Unstable angina: Secondary | ICD-10-CM

## 2019-12-22 DIAGNOSIS — M25572 Pain in left ankle and joints of left foot: Secondary | ICD-10-CM | POA: Diagnosis not present

## 2019-12-25 ENCOUNTER — Encounter: Payer: Self-pay | Admitting: Orthopedic Surgery

## 2019-12-25 NOTE — Progress Notes (Signed)
Office Visit Note   Patient: Robin [name]           Date of Birth: 04-13-1951           MRN: [id number] Visit Date: 12/22/2019              Requested by: Center, [facility] [address] [place],  [place] [address] PCP: Center, [place]  Chief Complaint  Patient presents with  . Left Ankle - Follow-up    CT scan review       [id number] insensate neuropathy on dialysis 3 times a week who is status post tibial calcaneal fusion on the left.  Patient complains of pain anteriorly over the left ankle with weightbearing.  Patient has been provided with compression socks she currently is not wearing them.  Patient has tried a fracture boot she states that this was difficult to walk with.  She currently uses a cane.  Patient also has been provided with a extra-depth shoe with double upright braces but this patient states that she would not wear this either.  Is a 69 year old woman with   Assessment & Plan: Visit Diagnoses:  1. Pain in left ankle and joints of left foot   2. Charcot foot due to diabetes mellitus ([place])     Plan: Discussed with the patient compression and support are her best options.  Recommended Voltaren gel to try for anti-inflammatory.  Discussed that with her diabetes dialysis and peripheral vascular disease she is not a good candidate for revision fusion.  Follow-Up Instructions: No follow-ups on file.   Ortho Exam  Patient is alert, oriented, no adenopathy, well-dressed, normal affect, normal respiratory effort. Examination patient does have swelling in the left leg compared to the right.  Patient has tenderness to palpation anteriorly and posteriorly over the ankle there is no redness no cellulitis no open ulcers.  The Doppler was used and patient has a monophasic dorsalis pedis pulse and the posterior tibial pulses biphasic.  Patient's foot is plantigrade she currently uses a cane for ambulation.  Patient has an unsteady gait with poor balance  Review of  the CT scan shows no cortical bone across the tibiotalar or subtalar joint with a fibrous malunion.    Imaging: No results found. No images are attached to the encounter.  Labs: Lab Results  Component Value Date   HGBA1C 7.9 (H) 10/01/2019   HGBA1C 10.0 (H) 04/15/2019   HGBA1C 6.7 (H) 09/09/2016   ESRSEDRATE 17 11/23/2019   CRP 21.9 (H) 11/23/2019   LABURIC 4.3 11/22/2019   LABURIC 5.4 12/01/2009   LABURIC 5.5 11/30/2009   REPTSTATUS 08/13/2016 FINAL 08/07/2016   GRAMSTAIN  03/14/2016    MODERATE WBC PRESENT,BOTH PMN AND MONONUCLEAR NO ORGANISMS SEEN    CULT  08/07/2016    NO GROWTH 5 DAYS Performed at [facility], [place] [address] [place], [place] [address]      Lab Results  Component Value Date   ALBUMIN 3.0 (L) 04/17/2019   ALBUMIN 3.3 (L) 04/15/2019   ALBUMIN 3.1 (L) 12/28/2017   LABURIC 4.3 11/22/2019   LABURIC 5.4 12/01/2009   LABURIC 5.5 11/30/2009    Lab Results  Component Value Date   MG 2.4 10/01/2019   MG 2.1 04/14/2019   MG 2.0 12/03/2015   No results found for: VD25OH  No results found for: PREALBUMIN CBC EXTENDED Latest Ref Rng & Units 11/23/2019 10/01/2019 09/30/2019  WBC 3.8 - 10.8 Thousand/uL 5.2 5.0 6.4  RBC 3.80 - 5.10 Million/uL 3.09(L) 3.29(L) 3.12(L)  HGB 11.7 - 15.5 g/dL 10.0(L) 11.0(L) 10.1(L)  HCT 35 - 45 % 30.5(L) 35.1(L) 33.7(L)  PLT 140 - 400 Thousand/uL 174 173 176  NEUTROABS 1,500 - 7,800 cells/uL 3,307 - -  LYMPHSABS 850 - 3,900 cells/uL 1,180 - -     There is no height or weight on file to calculate BMI.  Orders:  No orders of the defined types were placed in this encounter.  No orders of the defined types were placed in this encounter.    Procedures: No procedures performed  Clinical Data: No additional findings.  ROS:  All other systems negative, except as noted in the HPI. Review of Systems  Objective: Vital Signs: There were no vitals taken for this visit.  Specialty Comments:  No specialty  comments available.  PMFS History: Patient Active Problem List   Diagnosis Date Noted  . Chest pain 10/01/2019  . History of pulmonary embolism 10/01/2019  . Prolonged QT interval 10/01/2019  . Hypertensive urgency   . Other headache syndrome   . Orthostatic dizziness 04/15/2019  . Bradycardia 04/15/2019  . Syncope   . ESRD on hemodialysis ([place])   . Chest pain at rest 09/16/2018  . Diabetic Charct's arthropathy ([place]) 10/07/2016  . GERD (gastroesophageal reflux disease) 09/09/2016  . Depression 09/09/2016  . Chronic diastolic (congestive) heart failure ([place]) 09/09/2016  . Elevated troponin 09/09/2016  . Hyperlipidemia associated with type 2 diabetes mellitus ([place]) 03/26/2015  . Charcot ankle 03/16/2015  . Anemia, chronic renal failure   . NSVT (nonsustained ventricular tachycardia) ([place])   . Hypertension due to end stage renal disease caused by type 2 diabetes mellitus, on dialysis ([place]) 12/09/2014  . Bipolar affective disorder ([place]) 12/09/2014  . Diabetes mellitus type 2, insulin dependent ([place]) 11/17/2014  . CAD (coronary artery disease), native coronary artery with 2 stents  11/17/2014   Past Medical History:  Diagnosis Date  . Acute delirium 11/18/2014  . Acute encephalopathy   . Acute on chronic respiratory failure with hypoxia ([place]) 09/09/2016  . Acute respiratory failure with hypoxia ([place]) 11/29/2015  . Anemia in chronic kidney disease 12/09/2014  . Anxiety   . Arthritis   . Benign hypertension   . Bipolar affective disorder ([place]) 12/09/2014  . CAD (coronary artery disease), native coronary artery with 2 stents  11/17/2014  . Charcot foot due to diabetes mellitus ([place])   . Chronic combined systolic and diastolic CHF (congestive heart failure) ([place]) 11/18/2014  . Closed left ankle fracture 11/17/2014  . Confusion 01/21/2015  . Depression   . Diabetes mellitus without complication ([place])   . End stage renal disease on dialysis ([place])   . Fracture dislocation of ankle  11/17/2014  . GERD (gastroesophageal reflux disease)   . Heart murmur   . History of blood transfusion   . History of bronchitis   . History of pneumonia   . Hyperlipidemia 03/26/2015  . Hypertension associated with diabetes ([place]) 11/18/2014  . Hypertensive heart/renal disease with failure ([place]) 12/09/2014  . Hypokalemia 11/17/2014  . Multiple falls 01/21/2015  . Obesity 11/17/2014  . Onychomycosis 10/07/2016  . Right leg DVT ([place]) 12/05/2015  . SIRS (systemic inflammatory response syndrome) ([place]) 08/08/2016  . Sleep apnea   . Unstable angina ([place]) 12/26/2017  . Ventricular tachycardia ([place])     Family History  Family history unknown: Yes    Past Surgical History:  Procedure Laterality Date  . ABDOMINAL HYSTERECTOMY    . ANKLE  CLOSED REDUCTION N/A 11/17/2014   Procedure: CLOSED REDUCTION ANKLE;  Surgeon: Earlie Server, MD;  Location: [facility];  Service: Orthopedics;  Laterality: N/A;  . ANKLE FUSION Left 03/16/2015   Procedure: Left Tibiocalcaneal Fusion;  Surgeon: Newt Minion, MD;  Location: [facility];  Service: Orthopedics;  Laterality: Left;  . APPLICATION OF WOUND VAC Right 08/06/2016   Procedure: APPLICATION OF PREVENA WOUND VAC;  Surgeon: Newt Minion, MD;  Location: [facility];  Service: Orthopedics;  Laterality: Right;  . AV FISTULA PLACEMENT Left [id number]   done at [facility]     2 stent   . CHOLECYSTECTOMY    . CORONARY STENT PLACEMENT    . FOOT ARTHRODESIS Right 08/06/2016   Procedure: Right Foot Fusion Lisfranc Joint;  Surgeon: Newt Minion, MD;  Location: [facility];  Service: Orthopedics;  Laterality: Right;  . HARDWARE REMOVAL Left 03/16/2015   Procedure: Removal Hardware Left Ankle;  Surgeon: Newt Minion, MD;  Location: [facility];  Service: Orthopedics;  Laterality: Left;  . IR AV DIALY SHUNT INTRO NEEDLE/INTRACATH INITIAL W/PTA/IMG LEFT  01/05/2018  . IR US GUIDE VASC ACCESS LEFT  01/05/2018  . LEFT HEART CATH AND CORONARY ANGIOGRAPHY N/A 12/28/2017    Procedure: LEFT HEART CATH AND CORONARY ANGIOGRAPHY;  Surgeon: Burnell Blanks, MD;  Location: [facility];  Service: Cardiovascular;  Laterality: N/A;  . ORIF ANKLE FRACTURE Left 11/20/2014   Procedure: OPEN REDUCTION INTERNAL FIXATION (ORIF) ANKLE FRACTURE;  Surgeon: Renette Butters, MD;  Location: [facility];  Service: Orthopedics;  Laterality: Left;  . TONSILLECTOMY     Social History   Occupational History  . Not on file  Tobacco Use  . Smoking status: Current Every Day Smoker    Packs/day: 1.00    Types: Cigarettes  . Smokeless tobacco: Never Used  Substance and Sexual Activity  . Alcohol use: No  . Drug use: No  . Sexual activity: Never

## 2019-12-31 ENCOUNTER — Emergency Department (HOSPITAL_COMMUNITY)
Admission: EM | Admit: 2019-12-31 | Discharge: 2019-12-31 | Disposition: A | Discharge status: Home / Self Care | Payer: No Typology Code available for payment source | Attending: Emergency Medicine | Admitting: Emergency Medicine

## 2019-12-31 ENCOUNTER — Encounter (HOSPITAL_COMMUNITY): Payer: Self-pay

## 2019-12-31 DIAGNOSIS — Z5321 Procedure and treatment not carried out due to patient leaving prior to being seen by health care provider: Secondary | ICD-10-CM | POA: Diagnosis not present

## 2019-12-31 DIAGNOSIS — I10 Essential (primary) hypertension: Secondary | ICD-10-CM | POA: Diagnosis not present

## 2019-12-31 DIAGNOSIS — R58 Hemorrhage, not elsewhere classified: Secondary | ICD-10-CM | POA: Diagnosis not present

## 2019-12-31 DIAGNOSIS — T82838A Hemorrhage of vascular prosthetic devices, implants and grafts, initial encounter: Secondary | ICD-10-CM | POA: Insufficient documentation

## 2019-12-31 DIAGNOSIS — Y69 Unspecified misadventure during surgical and medical care: Secondary | ICD-10-CM | POA: Insufficient documentation

## 2019-12-31 NOTE — ED Triage Notes (Signed)
Pt comes via [facility], took her complete dialysis today, took her bandage off today and it started bleeding, reapplied, no bleeding now.

## 2019-12-31 NOTE — ED Notes (Signed)
Patient asked to have new dressing placed on dialysis graft. New dressing placed. Patient called family to come, and get her. I informed her that if she need to please come back.

## 2020-01-03 ENCOUNTER — Emergency Department (HOSPITAL_COMMUNITY)
Admission: EM | Admit: 2020-01-03 | Discharge: 2020-01-03 | Disposition: A | Discharge status: Home / Self Care | Payer: No Typology Code available for payment source | Attending: Emergency Medicine | Admitting: Emergency Medicine

## 2020-01-03 ENCOUNTER — Other Ambulatory Visit: Payer: Self-pay

## 2020-01-03 ENCOUNTER — Encounter (HOSPITAL_COMMUNITY)
Admission: EM | Disposition: A | Discharge status: Home / Self Care | Payer: Self-pay | Source: Home / Self Care | Attending: Emergency Medicine

## 2020-01-03 DIAGNOSIS — Y832 Surgical operation with anastomosis, bypass or graft as the cause of abnormal reaction of the patient, or of later complication, without mention of misadventure at the time of the procedure: Secondary | ICD-10-CM | POA: Insufficient documentation

## 2020-01-03 DIAGNOSIS — D631 Anemia in chronic kidney disease: Secondary | ICD-10-CM | POA: Insufficient documentation

## 2020-01-03 DIAGNOSIS — I132 Hypertensive heart and chronic kidney disease with heart failure and with stage 5 chronic kidney disease, or end stage renal disease: Secondary | ICD-10-CM | POA: Diagnosis not present

## 2020-01-03 DIAGNOSIS — E11610 Type 2 diabetes mellitus with diabetic neuropathic arthropathy: Secondary | ICD-10-CM | POA: Insufficient documentation

## 2020-01-03 DIAGNOSIS — Z7901 Long term (current) use of anticoagulants: Secondary | ICD-10-CM | POA: Insufficient documentation

## 2020-01-03 DIAGNOSIS — Z7982 Long term (current) use of aspirin: Secondary | ICD-10-CM | POA: Diagnosis not present

## 2020-01-03 DIAGNOSIS — I5042 Chronic combined systolic (congestive) and diastolic (congestive) heart failure: Secondary | ICD-10-CM | POA: Insufficient documentation

## 2020-01-03 DIAGNOSIS — E1159 Type 2 diabetes mellitus with other circulatory complications: Secondary | ICD-10-CM | POA: Insufficient documentation

## 2020-01-03 DIAGNOSIS — Z992 Dependence on renal dialysis: Secondary | ICD-10-CM | POA: Insufficient documentation

## 2020-01-03 DIAGNOSIS — Z955 Presence of coronary angioplasty implant and graft: Secondary | ICD-10-CM | POA: Insufficient documentation

## 2020-01-03 DIAGNOSIS — Z6826 Body mass index (BMI) 26.0-26.9, adult: Secondary | ICD-10-CM | POA: Insufficient documentation

## 2020-01-03 DIAGNOSIS — F17210 Nicotine dependence, cigarettes, uncomplicated: Secondary | ICD-10-CM | POA: Diagnosis not present

## 2020-01-03 DIAGNOSIS — Z794 Long term (current) use of insulin: Secondary | ICD-10-CM | POA: Insufficient documentation

## 2020-01-03 DIAGNOSIS — G4730 Sleep apnea, unspecified: Secondary | ICD-10-CM | POA: Insufficient documentation

## 2020-01-03 DIAGNOSIS — I2510 Atherosclerotic heart disease of native coronary artery without angina pectoris: Secondary | ICD-10-CM | POA: Insufficient documentation

## 2020-01-03 DIAGNOSIS — Z20822 Contact with and (suspected) exposure to covid-19: Secondary | ICD-10-CM | POA: Diagnosis not present

## 2020-01-03 DIAGNOSIS — K219 Gastro-esophageal reflux disease without esophagitis: Secondary | ICD-10-CM | POA: Insufficient documentation

## 2020-01-03 DIAGNOSIS — Z9049 Acquired absence of other specified parts of digestive tract: Secondary | ICD-10-CM | POA: Insufficient documentation

## 2020-01-03 DIAGNOSIS — N186 End stage renal disease: Secondary | ICD-10-CM | POA: Insufficient documentation

## 2020-01-03 DIAGNOSIS — T82858A Stenosis of vascular prosthetic devices, implants and grafts, initial encounter: Secondary | ICD-10-CM | POA: Insufficient documentation

## 2020-01-03 DIAGNOSIS — I5032 Chronic diastolic (congestive) heart failure: Secondary | ICD-10-CM | POA: Insufficient documentation

## 2020-01-03 DIAGNOSIS — G8929 Other chronic pain: Secondary | ICD-10-CM | POA: Insufficient documentation

## 2020-01-03 DIAGNOSIS — Z86718 Personal history of other venous thrombosis and embolism: Secondary | ICD-10-CM | POA: Insufficient documentation

## 2020-01-03 DIAGNOSIS — E785 Hyperlipidemia, unspecified: Secondary | ICD-10-CM | POA: Insufficient documentation

## 2020-01-03 DIAGNOSIS — Z951 Presence of aortocoronary bypass graft: Secondary | ICD-10-CM | POA: Insufficient documentation

## 2020-01-03 DIAGNOSIS — E1122 Type 2 diabetes mellitus with diabetic chronic kidney disease: Secondary | ICD-10-CM | POA: Diagnosis not present

## 2020-01-03 DIAGNOSIS — M1990 Unspecified osteoarthritis, unspecified site: Secondary | ICD-10-CM | POA: Insufficient documentation

## 2020-01-03 DIAGNOSIS — Z79899 Other long term (current) drug therapy: Secondary | ICD-10-CM | POA: Insufficient documentation

## 2020-01-03 DIAGNOSIS — E669 Obesity, unspecified: Secondary | ICD-10-CM | POA: Insufficient documentation

## 2020-01-03 DIAGNOSIS — F319 Bipolar disorder, unspecified: Secondary | ICD-10-CM | POA: Diagnosis not present

## 2020-01-03 DIAGNOSIS — F419 Anxiety disorder, unspecified: Secondary | ICD-10-CM | POA: Insufficient documentation

## 2020-01-03 DIAGNOSIS — T82838A Hemorrhage of vascular prosthetic devices, implants and grafts, initial encounter: Secondary | ICD-10-CM

## 2020-01-03 DIAGNOSIS — R58 Hemorrhage, not elsewhere classified: Secondary | ICD-10-CM | POA: Diagnosis not present

## 2020-01-03 HISTORY — PX: PERIPHERAL VASCULAR BALLOON ANGIOPLASTY: CATH118281

## 2020-01-03 HISTORY — PX: A/V FISTULAGRAM: CATH118298

## 2020-01-03 LAB — GLUCOSE, CAPILLARY
Glucose-Capillary: 67 mg/dL — ABNORMAL LOW (ref 70–99)
Glucose-Capillary: 103 mg/dL — ABNORMAL HIGH (ref 70–99)

## 2020-01-03 LAB — CBC WITH DIFFERENTIAL/PLATELET
Abs Immature Granulocytes: 0.03 10*3/uL (ref 0.00–0.07)
Basophils Absolute: 0.1 10*3/uL (ref 0.0–0.1)
Basophils Relative: 1 %
Eosinophils Absolute: 0.1 10*3/uL (ref 0.0–0.5)
Eosinophils Relative: 2 %
HCT: 33.7 % — ABNORMAL LOW (ref 36.0–46.0)
Hemoglobin: 10.2 g/dL — ABNORMAL LOW (ref 12.0–15.0)
Immature Granulocytes: 0 %
Lymphocytes Relative: 21 %
Lymphs Abs: 1.5 10*3/uL (ref 0.7–4.0)
MCH: 31.2 pg (ref 26.0–34.0)
MCHC: 30.3 g/dL (ref 30.0–36.0)
MCV: 103.1 fL — ABNORMAL HIGH (ref 80.0–100.0)
Monocytes Absolute: 0.8 10*3/uL (ref 0.1–1.0)
Monocytes Relative: 11 %
Neutro Abs: 4.5 10*3/uL (ref 1.7–7.7)
Neutrophils Relative %: 65 %
Platelets: 173 10*3/uL (ref 150–400)
RBC: 3.27 MIL/uL — ABNORMAL LOW (ref 3.87–5.11)
RDW: 17.4 % — ABNORMAL HIGH (ref 11.5–15.5)
WBC: 7 10*3/uL (ref 4.0–10.5)
nRBC: 0.3 % — ABNORMAL HIGH (ref 0.0–0.2)

## 2020-01-03 LAB — BASIC METABOLIC PANEL
Anion gap: 13 (ref 5–15)
BUN: 9 mg/dL (ref 8–23)
CO2: 33 mmol/L — ABNORMAL HIGH (ref 22–32)
Calcium: 9 mg/dL (ref 8.9–10.3)
Chloride: 94 mmol/L — ABNORMAL LOW (ref 98–111)
Creatinine, Ser: 4.13 mg/dL — ABNORMAL HIGH (ref 0.44–1.00)
GFR calc Af Amer: 12 mL/min — ABNORMAL LOW (ref >60)
GFR calc non Af Amer: 10 mL/min — ABNORMAL LOW (ref >60)
Glucose, Bld: 81 mg/dL (ref 70–99)
Potassium: 3.6 mmol/L (ref 3.5–5.1)
Sodium: 140 mmol/L (ref 135–145)

## 2020-01-03 LAB — SARS CORONAVIRUS 2 BY RT PCR (HOSPITAL ORDER, PERFORMED IN ~~LOC~~ HOSPITAL LAB): SARS Coronavirus 2: NEGATIVE

## 2020-01-03 SURGERY — A/V FISTULAGRAM
Anesthesia: LOCAL

## 2020-01-03 MED ORDER — FENTANYL CITRATE (PF) 100 MCG/2ML IJ SOLN
INTRAMUSCULAR | Status: DC | PRN
  Administered 2020-01-03: 25 ug via INTRAVENOUS

## 2020-01-03 MED ORDER — HEPARIN (PORCINE) IN NACL 1000-0.9 UT/500ML-% IV SOLN
INTRAVENOUS | Status: DC | PRN
  Administered 2020-01-03: 500 mL

## 2020-01-03 MED ORDER — FENTANYL CITRATE (PF) 100 MCG/2ML IJ SOLN
INTRAMUSCULAR | Status: AC
  Filled 2020-01-03: qty 2

## 2020-01-03 MED ORDER — LIDOCAINE HCL (PF) 1 % IJ SOLN
INTRAMUSCULAR | Status: AC
  Filled 2020-01-03: qty 30

## 2020-01-03 MED ORDER — IODIXANOL 320 MG/ML IV SOLN
INTRAVENOUS | Status: DC | PRN
  Administered 2020-01-03: 25 mL via INTRA_ARTERIAL

## 2020-01-03 MED ORDER — HEPARIN (PORCINE) IN NACL 1000-0.9 UT/500ML-% IV SOLN
INTRAVENOUS | Status: AC
  Filled 2020-01-03: qty 500

## 2020-01-03 MED ORDER — MIDAZOLAM HCL 2 MG/2ML IJ SOLN
INTRAMUSCULAR | Status: DC | PRN
  Administered 2020-01-03: 1 mg via INTRAVENOUS

## 2020-01-03 MED ORDER — LIDOCAINE HCL (PF) 1 % IJ SOLN
INTRAMUSCULAR | Status: DC | PRN
  Administered 2020-01-03: 5 mL via INTRADERMAL

## 2020-01-03 MED ORDER — MIDAZOLAM HCL 2 MG/2ML IJ SOLN
INTRAMUSCULAR | Status: AC
  Filled 2020-01-03: qty 2

## 2020-01-03 SURGICAL SUPPLY — 17 items
BAG SNAP BAND KOVER 36X36 (MISCELLANEOUS) ×3 IMPLANT
BALLN MUSTANG 12.0X40 75 (BALLOONS) ×3
BALLN MUSTANG 8.0X40 75 (BALLOONS) ×3
BALLOON MUSTANG 12.0X40 75 (BALLOONS) ×2 IMPLANT
BALLOON MUSTANG 8.0X40 75 (BALLOONS) ×2 IMPLANT
COVER DOME SNAP 22 D (MISCELLANEOUS) ×6 IMPLANT
GUIDEWIRE ANGLED .035X150CM (WIRE) ×3 IMPLANT
KIT ENCORE 26 ADVANTAGE (KITS) ×3 IMPLANT
KIT MICROPUNCTURE NIT STIFF (SHEATH) ×3 IMPLANT
PROTECTION STATION PRESSURIZED (MISCELLANEOUS) ×3
SHEATH PINNACLE R/O II 7F 4CM (SHEATH) ×3 IMPLANT
SHEATH PROBE COVER 6X72 (BAG) ×6 IMPLANT
STATION PROTECTION PRESSURIZED (MISCELLANEOUS) ×2 IMPLANT
STOPCOCK MORSE 400PSI 3WAY (MISCELLANEOUS) ×3 IMPLANT
TRAY PV CATH (CUSTOM PROCEDURE TRAY) ×3 IMPLANT
TUBING CIL FLEX 10 FLL-RA (TUBING) ×6 IMPLANT
WIRE BENTSON .035X145CM (WIRE) ×3 IMPLANT

## 2020-01-03 NOTE — ED Triage Notes (Signed)
Pt in via [facility] w/bleeding fistula after dialysis. States she finished full trx, and this also happened recently on 8/7. Total 1hr of direct pressure applied PTA. VSS, did have some lightheadedness when standing to get on EMS stretcher, normotensive and a&ox4

## 2020-01-03 NOTE — ED Notes (Signed)
Checked on Pt. Bandage looks good, pulse palitable

## 2020-01-03 NOTE — ED Provider Notes (Signed)
[name] EMERGENCY DEPARTMENT Provider Note   CSN: [id number] Arrival date & time: 01/03/20  1136     History Chief Complaint  Patient presents with  . Vascular Access Problem    Robin [name] is a 69 y.o. female.  HPI    Patient presents after dialysis with concern for bleeding from her shunt. Patient knowledges multiple medical problems, but states that she was in her usual state of health prior to completing today's session. Almost immediately after completion, removal of access, with no bleeding, she had a recurrence, and since that time, less than 40 minutes ago, she has had persistent bleeding from the left upper arm. EMS reports that the bleeding was controlled only with application of direct pressure, was otherwise pulsatile, copious. Patient reports some lightheadedness, though she notes that she has had this in the past. She denies pain anywhere other than where the pressure is being applied to her left arm. She denies recent illness. Patient receives her care at the [facility] in [place], dialysis in [place].  Past Medical History:  Diagnosis Date  . Acute delirium 11/18/2014  . Acute encephalopathy   . Acute on chronic respiratory failure with hypoxia ([place]) 09/09/2016  . Acute respiratory failure with hypoxia ([place]) 11/29/2015  . Anemia in chronic kidney disease 12/09/2014  . Anxiety   . Arthritis   . Benign hypertension   . Bipolar affective disorder ([place]) 12/09/2014  . CAD (coronary artery disease), native coronary artery with 2 stents  11/17/2014  . Charcot foot due to diabetes mellitus ([place])   . Chronic combined systolic and diastolic CHF (congestive heart failure) ([place]) 11/18/2014  . Closed left ankle fracture 11/17/2014  . Confusion 01/21/2015  . Depression   . Diabetes mellitus without complication ([place])   . End stage renal disease on dialysis ([place])   . Fracture dislocation of ankle 11/17/2014  . GERD (gastroesophageal  reflux disease)   . Heart murmur   . History of blood transfusion   . History of bronchitis   . History of pneumonia   . Hyperlipidemia 03/26/2015  . Hypertension associated with diabetes ([place]) 11/18/2014  . Hypertensive heart/renal disease with failure ([place]) 12/09/2014  . Hypokalemia 11/17/2014  . Multiple falls 01/21/2015  . Obesity 11/17/2014  . Onychomycosis 10/07/2016  . Right leg DVT ([place]) 12/05/2015  . SIRS (systemic inflammatory response syndrome) ([place]) 08/08/2016  . Sleep apnea   . Unstable angina ([place]) 12/26/2017  . Ventricular tachycardia [facility])     Patient Active Problem List   Diagnosis Date Noted  . Chest pain 10/01/2019  . History of pulmonary embolism 10/01/2019  . Prolonged QT interval 10/01/2019  . Hypertensive urgency   . Other headache syndrome   . Orthostatic dizziness 04/15/2019  . Bradycardia 04/15/2019  . Syncope   . ESRD on hemodialysis ([place])   . Chest pain at rest 09/16/2018  . Diabetic Charct's arthropathy ([place]) 10/07/2016  . GERD (gastroesophageal reflux disease) 09/09/2016  . Depression 09/09/2016  . Chronic diastolic (congestive) heart failure ([place]) 09/09/2016  . Elevated troponin 09/09/2016  . Hyperlipidemia associated with type 2 diabetes mellitus ([place]) 03/26/2015  . Charcot ankle 03/16/2015  . Anemia, chronic renal failure   . NSVT (nonsustained ventricular tachycardia) ([place])   . Hypertension due to end stage renal disease caused by type 2 diabetes mellitus, on dialysis ([place]) 12/09/2014  . Bipolar affective disorder ([place]) 12/09/2014  . Diabetes mellitus type 2, insulin dependent ([place]) 11/17/2014  . CAD (coronary artery disease),  native coronary artery with 2 stents  11/17/2014    Past Surgical History:  Procedure Laterality Date  . ABDOMINAL HYSTERECTOMY    . ANKLE CLOSED REDUCTION N/A 11/17/2014   Procedure: CLOSED REDUCTION ANKLE;  Surgeon: Earlie Server, MD;  Location: [place];  Service: Orthopedics;  Laterality: N/A;  . ANKLE FUSION Left  03/16/2015   Procedure: Left Tibiocalcaneal Fusion;  Surgeon: Newt Minion, MD;  Location: [place];  Service: Orthopedics;  Laterality: Left;  . APPLICATION OF WOUND VAC Right 08/06/2016   Procedure: APPLICATION OF PREVENA WOUND VAC;  Surgeon: Newt Minion, MD;  Location: [place];  Service: Orthopedics;  Laterality: Right;  . AV FISTULA PLACEMENT Left [id number]   done at [place]     2 stent   . CHOLECYSTECTOMY    . CORONARY STENT PLACEMENT    . FOOT ARTHRODESIS Right 08/06/2016   Procedure: Right Foot Fusion Lisfranc Joint;  Surgeon: Newt Minion, MD;  Location: [place];  Service: Orthopedics;  Laterality: Right;  . HARDWARE REMOVAL Left 03/16/2015   Procedure: Removal Hardware Left Ankle;  Surgeon: Newt Minion, MD;  Location: [place];  Service: Orthopedics;  Laterality: Left;  . IR AV DIALY SHUNT INTRO NEEDLE/INTRACATH INITIAL W/PTA/IMG LEFT  01/05/2018  . IR US GUIDE VASC ACCESS LEFT  01/05/2018  . LEFT HEART CATH AND CORONARY ANGIOGRAPHY N/A 12/28/2017   Procedure: LEFT HEART CATH AND CORONARY ANGIOGRAPHY;  Surgeon: Burnell Blanks, MD;  Location: [place] [facility];  Service: Cardiovascular;  Laterality: N/A;  . ORIF ANKLE FRACTURE Left 11/20/2014   Procedure: OPEN REDUCTION INTERNAL FIXATION (ORIF) ANKLE FRACTURE;  Surgeon: Renette Butters, MD;  Location: [place];  Service: Orthopedics;  Laterality: Left;  . TONSILLECTOMY       OB History   No obstetric history on file.     Family History  Family history unknown: Yes    Social History   Tobacco Use  . Smoking status: Current Every Day Smoker    Packs/day: 1.00    Types: Cigarettes  . Smokeless tobacco: Never Used  Substance Use Topics  . Alcohol use: No  . Drug use: No    Home Medications Prior to Admission medications   Medication Sig Start Date End Date Taking? Authorizing Provider  acetaminophen (TYLENOL) 325 MG tablet Take 325-975 mg by mouth every 6 (six) hours as needed for mild  pain or headache.    [provider]  albuterol (VENTOLIN HFA) 108 (90 Base) MCG/ACT inhaler Inhale 1-2 puffs into the lungs every 6 (six) hours as needed for wheezing or shortness of breath.    [provider]  amLODipine (NORVASC) 5 MG tablet Take 1 tablet (5 mg total) by mouth daily. 09/18/18   Chundi, Verne Spurr, MD  ammonium lactate (LAC-HYDRIN) 12 % lotion Apply 1 application topically 2 (two) times daily as needed (for itching/irritation).     [provider]  ARIPiprazole (ABILIFY) 15 MG tablet Take 7.5 mg by mouth daily.     [provider]  aspirin 81 MG chewable tablet Chew 1 tablet (81 mg total) by mouth daily. 12/30/17   Asencion Noble, MD  atorvastatin (LIPITOR) 40 MG tablet Take 20 mg by mouth at bedtime.     [provider]  AURYXIA 1 GM 210 MG(Fe) tablet Take 210 mg by mouth daily. 02/22/19   [provider]  dextrose (GLUTOSE) 40 % GEL Take 1 Tube by mouth once as needed (  for blood sugar less than 60).     [provider]  ELIQUIS 5 MG TABS tablet Take 2.5 mg by mouth 2 (two) times daily. 10/21/18   [provider]  gabapentin (NEURONTIN) 300 MG capsule Take 1 capsule (300 mg total) by mouth 2 (two) times daily. 06/24/19   Suzan Slick, NP  HYDROcodone-acetaminophen (NORCO) 5-325 MG tablet Take 1 tablet by mouth every 6 (six) hours as needed. 11/18/19   Persons, Bevely Palmer, PA  insulin detemir (LEVEMIR) 100 UNIT/ML injection Inject 0.08 mLs (8 Units total) into the skin at bedtime. Patient taking differently: Inject 20 Units into the skin 2 (two) times daily.  09/15/16   Oswald Hillock, MD  insulin lispro (HUMALOG) 100 UNIT/ML injection Inject 3-15 Units into the skin 3 (three) times daily before meals. Pt uses as needed per sliding scale:    150-200:  3  units 201-250:  5 units 251-300:  8 units 301-350:  10 units 351-400:  12 units and call MD 400+ 15 units    [provider]  isosorbide mononitrate  (IMDUR) 60 MG 24 hr tablet Take 60 mg by mouth daily.     [provider]  Melatonin 5 MG TABS Take 10 mg by mouth at bedtime.     [provider]  metoprolol tartrate 75 MG TABS Take 75 mg by mouth 2 (two) times daily. 04/17/19   Daisy Floro, DO  multivitamin (RENA-VIT) TABS tablet Take 1 tablet by mouth at bedtime.  12/11/17   [provider]  nitroGLYCERIN (NITROSTAT) 0.4 MG SL tablet Place 0.4 mg under the tongue every 5 (five) minutes as needed for chest pain.    [provider]  pantoprazole (PROTONIX) 20 MG tablet Take 20 mg by mouth 2 (two) times daily.     [provider]    Allergies    Patient has no known allergies.  Review of Systems   Review of Systems  Constitutional:       Per HPI, otherwise negative  HENT:       Per HPI, otherwise negative  Respiratory:       Per HPI, otherwise negative  Cardiovascular:       Per HPI, otherwise negative  Gastrointestinal: Negative for vomiting.  Endocrine:       Negative aside from HPI  Genitourinary:       Neg aside from HPI   Musculoskeletal:       Per HPI, otherwise negative  Skin: Positive for wound.  Allergic/Immunologic: Positive for immunocompromised state.  Neurological: Negative for syncope.  Hematological: Bruises/bleeds easily.    Physical Exam Updated Vital Signs BP (!) 180/96   Pulse 63   Temp 97.6 F (36.4 C) (Oral)   Resp (!) 21   Wt 69.9 kg   SpO2 98%   BMI 26.45 kg/m   Physical Exam Vitals and nursing note reviewed.  Constitutional:      General: She is not in acute distress.    Appearance: She is well-developed.  HENT:     Head: Normocephalic and atraumatic.  Eyes:     Conjunctiva/sclera: Conjunctivae normal.  Cardiovascular:     Rate and Rhythm: Normal rate and regular rhythm.  Pulmonary:     Effort: Pulmonary effort is normal. No respiratory distress.     Breath sounds: Normal breath sounds. No stridor.  Abdominal:     General: There is  no distension.  Musculoskeletal:       Arms:  Skin:    General: Skin is warm and dry.  Neurological:     Mental Status: She is alert and oriented to person, place, and time.     Cranial Nerves: No cranial nerve deficit.     ED Results / Procedures / Treatments   Labs (all labs ordered are listed, but only abnormal results are displayed) Labs Reviewed  SARS CORONAVIRUS 2 BY RT PCR [facility] ORDER, [place] LAB)  BASIC METABOLIC PANEL  CBC WITH DIFFERENTIAL/PLATELET    EKG EKG Interpretation  Date/Time:  Tuesday January 03 2020 11:56:03 EDT Ventricular Rate:  70 PR Interval:    QRS Duration: 94 QT Interval:  490 QTC Calculation: 529 R Axis:   -41 Text Interpretation: Sinus rhythm Abnormal R-wave progression, late transition LVH with secondary repolarization abnormality Prolonged QT interval No significant change since last tracing Abnormal ECG Confirmed by Carmin Muskrat [phone]) on 01/03/2020 12:04:09 PM   Procedures Procedures (including critical care time)   ED Course  I have reviewed the triage vital signs and the nursing notes.  Pertinent labs & imaging results that were available during my care of the patient were reviewed by me and considered in my medical decision making (see chart for details).     Immediately after arrival with consideration of the patient's ongoing bleeding from her dialysis shunt I changed dressing, did direct pressure application with tight wrapping, and there was hemostasis, though not apparent cessation of the bleed itself. Subsequently discussed case with our vascular surgery colleagues for evaluation. MDM Rules/Calculators/A&P                          3:04 PM Patient has been seen and evaluated by vascular surgery, plan is for fistulogram.  She remains hemodynamically unremarkable, basic labs are pending.  EKG unremarkable. Reviewing the patient's history of lightheadedness, today's reassuring findings suggest  that the chronic, particular given the absence of any ongoing pain, true syncope, and multiple prior evaluations for same. Is appropriate for ongoing care from our vascular surgery colleagues, dispo accordingly. Final Clinical Impression(s) / ED Diagnoses Final diagnoses:  Bleeding from dialysis shunt, initial encounter [facility])     Carmin Muskrat, MD 01/03/20 1507

## 2020-01-03 NOTE — Progress Notes (Signed)
Eating crackers w/peanut butter and [place] sandwich.

## 2020-01-03 NOTE — Progress Notes (Signed)
Assisted patient to get dressed. D/C'ed to home. Assisted patient into daughter's car.

## 2020-01-03 NOTE — Discharge Instructions (Signed)

## 2020-01-03 NOTE — Consult Note (Addendum)
Hospital Consult    Reason for Consult:  Bleeding AV fistula Requesting Physician:  Carmin Muskrat, MD MRN #:  [id number]  History of Present Illness: This is a 69 y.o. female who presents after dialysis today due to bleeding from her AV fistula.Vascular surgery has been asked to see and evaluate the patient. She is not the best historian but she explains that her fistula was placed at [place] several years ago. She reports that she has not had bleeding issues in the past. She says she first noticed bleeding on Sunday after she took her dressing off following her Saturday session. She says that then it sort of was oozing from needle site. She says it was not bleeding during dialysis however following dialysis today she had worsening bleeding that would not stop so she was brought to ED by EMS. Hemostasis via EMS and again in ED was obtained with direct pressure and compression dressings. She is not having any signs and/or symptoms of steal syndrome. She does not have any pain in left arm or hand. She normally dialyzes on Tues/Thurs/ Sat at [name] location. Patient is on Eliquis which she has not held  Past Medical History:  Diagnosis Date  . Acute delirium 11/18/2014  . Acute encephalopathy   . Acute on chronic respiratory failure with hypoxia ([place]) 09/09/2016  . Acute respiratory failure with hypoxia ([place]) 11/29/2015  . Anemia in chronic kidney disease 12/09/2014  . Anxiety   . Arthritis   . Benign hypertension   . Bipolar affective disorder ([place]) 12/09/2014  . CAD (coronary artery disease), native coronary artery with 2 stents  11/17/2014  . Charcot foot due to diabetes mellitus ([place])   . Chronic combined systolic and diastolic CHF (congestive heart failure) ([place]) 11/18/2014  . Closed left ankle fracture 11/17/2014  . Confusion 01/21/2015  . Depression   . Diabetes mellitus without complication ([place])   . End stage renal disease on dialysis ([place])   . Fracture dislocation of ankle 11/17/2014   . GERD (gastroesophageal reflux disease)   . Heart murmur   . History of blood transfusion   . History of bronchitis   . History of pneumonia   . Hyperlipidemia 03/26/2015  . Hypertension associated with diabetes ([place]) 11/18/2014  . Hypertensive heart/renal disease with failure ([place]) 12/09/2014  . Hypokalemia 11/17/2014  . Multiple falls 01/21/2015  . Obesity 11/17/2014  . Onychomycosis 10/07/2016  . Right leg DVT ([place]) 12/05/2015  . SIRS (systemic inflammatory response syndrome) ([place]) 08/08/2016  . Sleep apnea   . Unstable angina ([place]) 12/26/2017  . Ventricular tachycardia [facility])     Past Surgical History:  Procedure Laterality Date  . ABDOMINAL HYSTERECTOMY    . ANKLE CLOSED REDUCTION N/A 11/17/2014   Procedure: CLOSED REDUCTION ANKLE;  Surgeon: Earlie Server, MD;  Location: [place];  Service: Orthopedics;  Laterality: N/A;  . ANKLE FUSION Left 03/16/2015   Procedure: Left Tibiocalcaneal Fusion;  Surgeon: Newt Minion, MD;  Location: [place];  Service: Orthopedics;  Laterality: Left;  . APPLICATION OF WOUND VAC Right 08/06/2016   Procedure: APPLICATION OF PREVENA WOUND VAC;  Surgeon: Newt Minion, MD;  Location: [place];  Service: Orthopedics;  Laterality: Right;  . AV FISTULA PLACEMENT Left [id number]   done at [place]     2 stent   . CHOLECYSTECTOMY    . CORONARY STENT PLACEMENT    . FOOT ARTHRODESIS Right 08/06/2016   Procedure: Right Foot Fusion Lisfranc Joint;  Surgeon: Newt Minion, MD;  Location: [place];  Service: Orthopedics;  Laterality: Right;  . HARDWARE REMOVAL Left 03/16/2015   Procedure: Removal Hardware Left Ankle;  Surgeon: Newt Minion, MD;  Location: [place];  Service: Orthopedics;  Laterality: Left;  . IR AV DIALY SHUNT INTRO NEEDLE/INTRACATH INITIAL W/PTA/IMG LEFT  01/05/2018  . IR US GUIDE VASC ACCESS LEFT  01/05/2018  . LEFT HEART CATH AND CORONARY ANGIOGRAPHY N/A 12/28/2017   Procedure: LEFT HEART CATH AND CORONARY ANGIOGRAPHY;  Surgeon:  Burnell Blanks, MD;  Location: [place] [facility];  Service: Cardiovascular;  Laterality: N/A;  . ORIF ANKLE FRACTURE Left 11/20/2014   Procedure: OPEN REDUCTION INTERNAL FIXATION (ORIF) ANKLE FRACTURE;  Surgeon: Renette Butters, MD;  Location: [place];  Service: Orthopedics;  Laterality: Left;  . TONSILLECTOMY      No Known Allergies  Prior to Admission medications   Medication Sig Start Date End Date Taking? Authorizing Provider  acetaminophen (TYLENOL) 325 MG tablet Take 325-975 mg by mouth every 6 (six) hours as needed for mild pain or headache.    [provider]  albuterol (VENTOLIN HFA) 108 (90 Base) MCG/ACT inhaler Inhale 1-2 puffs into the lungs every 6 (six) hours as needed for wheezing or shortness of breath.    [provider]  amLODipine (NORVASC) 5 MG tablet Take 1 tablet (5 mg total) by mouth daily. 09/18/18   Chundi, Verne Spurr, MD  ammonium lactate (LAC-HYDRIN) 12 % lotion Apply 1 application topically 2 (two) times daily as needed (for itching/irritation).     [provider]  ARIPiprazole (ABILIFY) 15 MG tablet Take 7.5 mg by mouth daily.     [provider]  aspirin 81 MG chewable tablet Chew 1 tablet (81 mg total) by mouth daily. 12/30/17   Asencion Noble, MD  atorvastatin (LIPITOR) 40 MG tablet Take 20 mg by mouth at bedtime.     [provider]  AURYXIA 1 GM 210 MG(Fe) tablet Take 210 mg by mouth daily. 02/22/19   [provider]  dextrose (GLUTOSE) 40 % GEL Take 1 Tube by mouth once as needed (for blood sugar less than 60).     [provider]  ELIQUIS 5 MG TABS tablet Take 2.5 mg by mouth 2 (two) times daily. 10/21/18   [provider]  gabapentin (NEURONTIN) 300 MG capsule Take 1 capsule (300 mg total) by mouth 2 (two) times daily. 06/24/19   Suzan Slick, NP  HYDROcodone-acetaminophen (NORCO) 5-325 MG tablet Take 1 tablet by mouth every 6 (six) hours as needed. 11/18/19   Persons, Bevely Palmer, PA   insulin detemir (LEVEMIR) 100 UNIT/ML injection Inject 0.08 mLs (8 Units total) into the skin at bedtime. Patient taking differently: Inject 20 Units into the skin 2 (two) times daily.  09/15/16   Oswald Hillock, MD  insulin lispro (HUMALOG) 100 UNIT/ML injection Inject 3-15 Units into the skin 3 (three) times daily before meals. Pt uses as needed per sliding scale:    150-200:  3  units 201-250:  5 units 251-300:  8 units 301-350:  10 units 351-400:  12 units and call MD 400+ 15 units    [provider]  isosorbide mononitrate (IMDUR) 60 MG 24 hr tablet Take 60 mg by mouth daily.     [provider]  Melatonin 5 MG TABS Take 10 mg by mouth at bedtime.     [provider]  metoprolol tartrate 75 MG TABS Take  75 mg by mouth 2 (two) times daily. 04/17/19   Daisy Floro, DO  multivitamin (RENA-VIT) TABS tablet Take 1 tablet by mouth at bedtime.  12/11/17   [provider]  nitroGLYCERIN (NITROSTAT) 0.4 MG SL tablet Place 0.4 mg under the tongue every 5 (five) minutes as needed for chest pain.    [provider]  pantoprazole (PROTONIX) 20 MG tablet Take 20 mg by mouth 2 (two) times daily.     [provider]    Social History   Socioeconomic History  . Marital status: Divorced    Spouse name: Not on file  . Number of children: Not on file  . Years of education: Not on file  . Highest education level: Not on file  Occupational History  . Not on file  Tobacco Use  . Smoking status: Current Every Day Smoker    Packs/day: 1.00    Types: Cigarettes  . Smokeless tobacco: Never Used  Substance and Sexual Activity  . Alcohol use: No  . Drug use: No  . Sexual activity: Never  Other Topics Concern  . Not on file  Social History Narrative  . Not on file   Social Determinants of Health   Financial Resource Strain:   . Difficulty of Paying Living Expenses:   Food Insecurity:   . Worried About Charity fundraiser in the Last  Year:   . Arboriculturist in the Last Year:   Transportation Needs:   . Film/video editor (Medical):   Marland Kitchen Lack of Transportation (Non-Medical):   Physical Activity:   . Days of Exercise per Week:   . Minutes of Exercise per Session:   Stress:   . Feeling of Stress :   Social Connections:   . Frequency of Communication with Friends and Family:   . Frequency of Social Gatherings with Friends and Family:   . Attends Religious Services:   . Active Member of Clubs or Organizations:   . Attends Archivist Meetings:   Marland Kitchen Marital Status:   Intimate Partner Violence:   . Fear of Current or Ex-Partner:   . Emotionally Abused:   Marland Kitchen Physically Abused:   . Sexually Abused:      Family History  Family history unknown: Yes    ROS: Otherwise negative unless mentioned in HPI  Physical Examination  Vitals:   01/03/20 1159 01/03/20 1200  BP: (!) 162/98 (!) 162/98  Pulse: 71 72  Resp: 14 13  Temp: 97.6 F (36.4 C)   SpO2: 95% 95%   Body mass index is 26.45 kg/m.  General:  WDWN in NAD Gait: Not observed HENT: WNL, normocephalic Pulmonary: normal non-labored breathing Cardiac: regular  Abdomen:  soft, NT/ND, no masses Vascular Exam/Pulses: 2+ left radial and brachial pulses. Left av fistula with bleeding from needle hole in the medial aspect of the distal upper arm. Bleeding controlled with direct pressure and compression dressing reapplied Extremities: without ischemic changes, without Gangrene , without cellulitis; without open wounds; left hand warm, normal grip strength Musculoskeletal: no muscle wasting or atrophy  Neurologic: A&O X 3;  No focal weakness or paresthesias are detected; speech is fluent/normal Psychiatric:  The pt has Normal affect.   CBC    Component Value Date/Time   WBC 5.2 11/23/2019 1443   RBC 3.09 (L) 11/23/2019 1443   HGB 10.0 (L) 11/23/2019 1443   HCT 30.5 (L) 11/23/2019 1443   PLT 174 11/23/2019 1443   MCV 98.7 11/23/2019 1443  MCH 32.4 11/23/2019 1443   MCHC 32.8 11/23/2019 1443   RDW 15.2 (H) 11/23/2019 1443   LYMPHSABS 1,180 11/23/2019 1443   MONOABS 0.6 04/17/2019 0218   EOSABS 99 11/23/2019 1443   BASOSABS 62 11/23/2019 1443    BMET    Component Value Date/Time   NA 140 10/01/2019 0630   NA 140 08/25/2016 0000   K 3.8 10/01/2019 0630   CL 97 (L) 10/01/2019 0630   CO2 27 10/01/2019 0630   GLUCOSE 114 (H) 10/01/2019 0630   BUN 24 (H) 10/01/2019 0630   BUN 31 (A) 08/25/2016 0000   CREATININE 7.98 (H) 10/01/2019 0630   CALCIUM 9.4 10/01/2019 0630   GFRNONAA 5 (L) 10/01/2019 0630   GFRAA 5 (L) 10/01/2019 0630    COAGS: Lab Results  Component Value Date   INR 1.6 (H) 09/16/2018   INR 1.15 09/08/2016   INR 2.25 08/12/2016     Statin:  Yes.   Beta Blocker:  Yes.   Aspirin:  Yes.   ACEI:  No. ARB:  No. CCB use:  Yes Other antiplatelets/anticoagulants:  Yes.   Eliquis   ASSESSMENT/PLAN: This is a 69 y.o. female with ESRD on hemodialysis via a left upper extremity arteriovenous fistula. She has had bleeding for two days now from needle hole. Had initially improved with pressure dressing in ER but on taking down dressing it is still actively bleeding. Discussed with Dr. Trula Slade and will schedule her for a fistulogram today. Maintain compression dressing on patient  [facility] [phone] 01/03/2020  1:44 PM    I agree with the above.  I have seen and evaluated the patient.  She has been to the ER for bleeding from her dialysis graft twice in the past 3 days.  She is on anticoagulation.  The bleeding is from a needle puncture.  There is no ulceration.  I suspect she has a stenosis within her graft and so we will proceed with fistulogram today.  Annamarie Major

## 2020-01-03 NOTE — Op Note (Signed)
    [name] MRN: [id number] DOB: 10-Mar-1951 Sex: female  01/03/2020 Pre-operative Diagnosis: Bleeding from left upper arm dialysis graft Post-operative diagnosis:  Same Surgeon:  Annamarie Major Procedure Performed:  1.  Ultrasound guided access, left upper arm dialysis graft  2.  Shuntogram  3.  Balloon venoplasty, left axillary vein (peripheral)  4.  Conscious sedation, 25 minutes     Indications: The patient came to the ER today for the second time in 3 days for bleeding from her dialysis graft.  I have recommended shuntogram to evaluate for central stenosis.  Procedure:  The patient was identified in the holding area and taken to room 8.  The patient was then placed supine on the table and prepped and draped in the usual sterile fashion.  A time out was called.  Conscious sedation was administered with the use of IV fentanyl and Versed under continuous physician and nurse monitoring.  Heart rate, blood pressure, and oxygen saturations were continuously monitored.  Total sedation time was 25 minutes ultrasound was used to evaluate the fistula.  The vein was patent and compressible.  A digital ultrasound image was acquired.  The fistula was then accessed under ultrasound guidance using a micropuncture needle.  An 018 wire was then asvanced without resistance and a micropuncture sheath was placed.  Contrast injections were then performed through the sheath.  Findings: The central venous system is widely patent.  A stent is visualized at the venous outflow tract.  Just beyond the stent there is a 80% stenosis within the axillary vein.  The arterial venous anastomosis is widely patent.   Intervention: After the above images were acquired the decision was made to proceed with intervention.  A 7 French sheath was inserted over a Bentson wire.  I used a Glidewire to cross the lesion.  I then proceeded with balloon venoplasty using an 8 x 40 Mustang balloon.  Follow-up imaging revealed  improved but suboptimal result and so I upsized to a 12 x 40 Mustang balloon.  This was taken to 20 atm.  Follow-up imaging revealed complete resolution of the stenosis.  The access site was closed with a 4-0 Monocryl.  Impression:  #1  Greater than 80% stenosis just beyond the venous outflow stent treated using an 8 mm followed by 12 mm balloon with residual stenosis less than 10%.     Theotis Burrow, M.D., [name] [place] Office: [phone] Pager:  [phone]

## 2020-01-03 NOTE — Progress Notes (Signed)
Discharge instructions reviewed with daughter as well and given to daughter until patient discharged. Daughter updated on wait time for that.

## 2020-01-04 ENCOUNTER — Encounter (HOSPITAL_COMMUNITY): Payer: Self-pay | Admitting: Surgery

## 2020-01-08 ENCOUNTER — Other Ambulatory Visit: Payer: Self-pay

## 2020-01-08 ENCOUNTER — Encounter (HOSPITAL_COMMUNITY): Payer: Self-pay | Admitting: Emergency Medicine

## 2020-01-08 ENCOUNTER — Emergency Department (HOSPITAL_COMMUNITY)
Admission: EM | Admit: 2020-01-08 | Discharge: 2020-01-09 | Disposition: A | Discharge status: Home / Self Care | Payer: No Typology Code available for payment source | Source: Home / Self Care

## 2020-01-08 ENCOUNTER — Emergency Department (HOSPITAL_COMMUNITY): Payer: No Typology Code available for payment source

## 2020-01-08 DIAGNOSIS — R4182 Altered mental status, unspecified: Secondary | ICD-10-CM | POA: Insufficient documentation

## 2020-01-08 DIAGNOSIS — R531 Weakness: Secondary | ICD-10-CM | POA: Insufficient documentation

## 2020-01-08 DIAGNOSIS — R6510 Systemic inflammatory response syndrome (SIRS) of non-infectious origin without acute organ dysfunction: Secondary | ICD-10-CM | POA: Diagnosis not present

## 2020-01-08 DIAGNOSIS — U071 COVID-19: Secondary | ICD-10-CM | POA: Diagnosis not present

## 2020-01-08 DIAGNOSIS — Z5321 Procedure and treatment not carried out due to patient leaving prior to being seen by health care provider: Secondary | ICD-10-CM | POA: Insufficient documentation

## 2020-01-08 LAB — DIFFERENTIAL
Abs Immature Granulocytes: 0.03 10*3/uL (ref 0.00–0.07)
Basophils Absolute: 0 10*3/uL (ref 0.0–0.1)
Basophils Relative: 1 %
Eosinophils Absolute: 0 10*3/uL (ref 0.0–0.5)
Eosinophils Relative: 0 %
Immature Granulocytes: 1 %
Lymphocytes Relative: 12 %
Lymphs Abs: 0.6 10*3/uL — ABNORMAL LOW (ref 0.7–4.0)
Monocytes Absolute: 0.8 10*3/uL (ref 0.1–1.0)
Monocytes Relative: 16 %
Neutro Abs: 3.6 10*3/uL (ref 1.7–7.7)
Neutrophils Relative %: 70 %

## 2020-01-08 LAB — I-STAT CHEM 8, ED
BUN: 17 mg/dL (ref 8–23)
Calcium, Ion: 1.06 mmol/L — ABNORMAL LOW (ref 1.15–1.40)
Chloride: 95 mmol/L — ABNORMAL LOW (ref 98–111)
Creatinine, Ser: 6.6 mg/dL — ABNORMAL HIGH (ref 0.44–1.00)
Glucose, Bld: 131 mg/dL — ABNORMAL HIGH (ref 70–99)
HCT: 31 % — ABNORMAL LOW (ref 36.0–46.0)
Hemoglobin: 10.5 g/dL — ABNORMAL LOW (ref 12.0–15.0)
Potassium: 4.1 mmol/L (ref 3.5–5.1)
Sodium: 135 mmol/L (ref 135–145)
TCO2: 26 mmol/L (ref 22–32)

## 2020-01-08 LAB — CBC
HCT: 32.4 % — ABNORMAL LOW (ref 36.0–46.0)
Hemoglobin: 9.7 g/dL — ABNORMAL LOW (ref 12.0–15.0)
MCH: 32.4 pg (ref 26.0–34.0)
MCHC: 29.9 g/dL — ABNORMAL LOW (ref 30.0–36.0)
MCV: 108.4 fL — ABNORMAL HIGH (ref 80.0–100.0)
Platelets: 129 10*3/uL — ABNORMAL LOW (ref 150–400)
RBC: 2.99 MIL/uL — ABNORMAL LOW (ref 3.87–5.11)
RDW: 19.1 % — ABNORMAL HIGH (ref 11.5–15.5)
WBC: 5.2 10*3/uL (ref 4.0–10.5)
nRBC: 0 % (ref 0.0–0.2)

## 2020-01-08 LAB — COMPREHENSIVE METABOLIC PANEL
ALT: 16 U/L (ref 0–44)
AST: 26 U/L (ref 15–41)
Albumin: 3.1 g/dL — ABNORMAL LOW (ref 3.5–5.0)
Alkaline Phosphatase: 78 U/L (ref 38–126)
Anion gap: 13 (ref 5–15)
BUN: 16 mg/dL (ref 8–23)
CO2: 29 mmol/L (ref 22–32)
Calcium: 9.1 mg/dL (ref 8.9–10.3)
Chloride: 94 mmol/L — ABNORMAL LOW (ref 98–111)
Creatinine, Ser: 6.98 mg/dL — ABNORMAL HIGH (ref 0.44–1.00)
GFR calc Af Amer: 6 mL/min — ABNORMAL LOW (ref >60)
GFR calc non Af Amer: 6 mL/min — ABNORMAL LOW (ref >60)
Glucose, Bld: 138 mg/dL — ABNORMAL HIGH (ref 70–99)
Potassium: 4.1 mmol/L (ref 3.5–5.1)
Sodium: 136 mmol/L (ref 135–145)
Total Bilirubin: 0.9 mg/dL (ref 0.3–1.2)
Total Protein: 5.9 g/dL — ABNORMAL LOW (ref 6.5–8.1)

## 2020-01-08 LAB — PROTIME-INR
INR: 1.4 — ABNORMAL HIGH (ref 0.8–1.2)
Prothrombin Time: 16.5 seconds — ABNORMAL HIGH (ref 11.4–15.2)

## 2020-01-08 LAB — APTT: aPTT: 39 seconds — ABNORMAL HIGH (ref 24–36)

## 2020-01-08 MED ORDER — SODIUM CHLORIDE 0.9% FLUSH
3.0000 mL | Freq: Once | INTRAVENOUS | Status: DC
Start: 2020-01-08 — End: 2020-01-09

## 2020-01-08 NOTE — ED Triage Notes (Signed)
Pt BIB family who reports pt has had increased altered mental status and weakness. Pt's last seen normal was yesterday. Per family, pt has dementia, but is not at her baseline. Pt has a nurse who checks on pt daily and stated the pt slept more than normal today and pt did not take her medications last night.

## 2020-01-09 LAB — CBG MONITORING, ED: Glucose-Capillary: 104 mg/dL — ABNORMAL HIGH (ref 70–99)

## 2020-01-09 NOTE — ED Notes (Signed)
Pt was wheeled out by family saying they did not want to wait

## 2020-01-10 ENCOUNTER — Inpatient Hospital Stay (HOSPITAL_COMMUNITY)
Admission: EM | Admit: 2020-01-10 | Discharge: 2020-01-31 | DRG: 981 | Disposition: A | Discharge status: Skilled Nursing Facility | Payer: No Typology Code available for payment source | Attending: Internal Medicine | Admitting: Internal Medicine

## 2020-01-10 ENCOUNTER — Other Ambulatory Visit: Payer: Self-pay

## 2020-01-10 ENCOUNTER — Encounter (HOSPITAL_COMMUNITY): Payer: Self-pay | Admitting: Family Medicine

## 2020-01-10 ENCOUNTER — Emergency Department (HOSPITAL_COMMUNITY): Payer: No Typology Code available for payment source

## 2020-01-10 DIAGNOSIS — G4730 Sleep apnea, unspecified: Secondary | ICD-10-CM | POA: Diagnosis present

## 2020-01-10 DIAGNOSIS — Y841 Kidney dialysis as the cause of abnormal reaction of the patient, or of later complication, without mention of misadventure at the time of the procedure: Secondary | ICD-10-CM | POA: Diagnosis not present

## 2020-01-10 DIAGNOSIS — U071 COVID-19: Principal | ICD-10-CM | POA: Diagnosis present

## 2020-01-10 DIAGNOSIS — R296 Repeated falls: Secondary | ICD-10-CM | POA: Diagnosis present

## 2020-01-10 DIAGNOSIS — F419 Anxiety disorder, unspecified: Secondary | ICD-10-CM | POA: Diagnosis not present

## 2020-01-10 DIAGNOSIS — Z7901 Long term (current) use of anticoagulants: Secondary | ICD-10-CM

## 2020-01-10 DIAGNOSIS — I25110 Atherosclerotic heart disease of native coronary artery with unstable angina pectoris: Secondary | ICD-10-CM | POA: Diagnosis not present

## 2020-01-10 DIAGNOSIS — I2510 Atherosclerotic heart disease of native coronary artery without angina pectoris: Secondary | ICD-10-CM | POA: Diagnosis present

## 2020-01-10 DIAGNOSIS — I132 Hypertensive heart and chronic kidney disease with heart failure and with stage 5 chronic kidney disease, or end stage renal disease: Secondary | ICD-10-CM | POA: Diagnosis not present

## 2020-01-10 DIAGNOSIS — I120 Hypertensive chronic kidney disease with stage 5 chronic kidney disease or end stage renal disease: Secondary | ICD-10-CM | POA: Diagnosis not present

## 2020-01-10 DIAGNOSIS — Z981 Arthrodesis status: Secondary | ICD-10-CM | POA: Diagnosis not present

## 2020-01-10 DIAGNOSIS — F0390 Unspecified dementia without behavioral disturbance: Secondary | ICD-10-CM | POA: Diagnosis not present

## 2020-01-10 DIAGNOSIS — E785 Hyperlipidemia, unspecified: Secondary | ICD-10-CM | POA: Diagnosis present

## 2020-01-10 DIAGNOSIS — N185 Chronic kidney disease, stage 5: Secondary | ICD-10-CM | POA: Diagnosis not present

## 2020-01-10 DIAGNOSIS — F319 Bipolar disorder, unspecified: Secondary | ICD-10-CM | POA: Diagnosis present

## 2020-01-10 DIAGNOSIS — N186 End stage renal disease: Secondary | ICD-10-CM | POA: Diagnosis present

## 2020-01-10 DIAGNOSIS — R279 Unspecified lack of coordination: Secondary | ICD-10-CM | POA: Diagnosis not present

## 2020-01-10 DIAGNOSIS — R41841 Cognitive communication deficit: Secondary | ICD-10-CM | POA: Diagnosis present

## 2020-01-10 DIAGNOSIS — I5030 Unspecified diastolic (congestive) heart failure: Secondary | ICD-10-CM | POA: Diagnosis present

## 2020-01-10 DIAGNOSIS — E1122 Type 2 diabetes mellitus with diabetic chronic kidney disease: Secondary | ICD-10-CM | POA: Diagnosis not present

## 2020-01-10 DIAGNOSIS — R791 Abnormal coagulation profile: Secondary | ICD-10-CM | POA: Diagnosis not present

## 2020-01-10 DIAGNOSIS — Z794 Long term (current) use of insulin: Secondary | ICD-10-CM

## 2020-01-10 DIAGNOSIS — I5042 Chronic combined systolic (congestive) and diastolic (congestive) heart failure: Secondary | ICD-10-CM | POA: Diagnosis not present

## 2020-01-10 DIAGNOSIS — E119 Type 2 diabetes mellitus without complications: Secondary | ICD-10-CM

## 2020-01-10 DIAGNOSIS — R9431 Abnormal electrocardiogram [ECG] [EKG]: Secondary | ICD-10-CM | POA: Diagnosis present

## 2020-01-10 DIAGNOSIS — T82838A Hemorrhage of vascular prosthetic devices, implants and grafts, initial encounter: Secondary | ICD-10-CM | POA: Diagnosis not present

## 2020-01-10 DIAGNOSIS — R2689 Other abnormalities of gait and mobility: Secondary | ICD-10-CM | POA: Diagnosis present

## 2020-01-10 DIAGNOSIS — N2581 Secondary hyperparathyroidism of renal origin: Secondary | ICD-10-CM | POA: Diagnosis present

## 2020-01-10 DIAGNOSIS — K219 Gastro-esophageal reflux disease without esophagitis: Secondary | ICD-10-CM | POA: Diagnosis present

## 2020-01-10 DIAGNOSIS — Z992 Dependence on renal dialysis: Secondary | ICD-10-CM | POA: Diagnosis not present

## 2020-01-10 DIAGNOSIS — D631 Anemia in chronic kidney disease: Secondary | ICD-10-CM | POA: Diagnosis present

## 2020-01-10 DIAGNOSIS — T82868A Thrombosis of vascular prosthetic devices, implants and grafts, initial encounter: Secondary | ICD-10-CM | POA: Diagnosis not present

## 2020-01-10 DIAGNOSIS — M1990 Unspecified osteoarthritis, unspecified site: Secondary | ICD-10-CM | POA: Diagnosis present

## 2020-01-10 DIAGNOSIS — Z86718 Personal history of other venous thrombosis and embolism: Secondary | ICD-10-CM | POA: Diagnosis not present

## 2020-01-10 DIAGNOSIS — F05 Delirium due to known physiological condition: Secondary | ICD-10-CM | POA: Diagnosis present

## 2020-01-10 DIAGNOSIS — R6510 Systemic inflammatory response syndrome (SIRS) of non-infectious origin without acute organ dysfunction: Secondary | ICD-10-CM | POA: Diagnosis not present

## 2020-01-10 DIAGNOSIS — J1282 Pneumonia due to coronavirus disease 2019: Secondary | ICD-10-CM | POA: Diagnosis present

## 2020-01-10 DIAGNOSIS — T82590A Other mechanical complication of surgically created arteriovenous fistula, initial encounter: Secondary | ICD-10-CM

## 2020-01-10 DIAGNOSIS — R0902 Hypoxemia: Secondary | ICD-10-CM

## 2020-01-10 DIAGNOSIS — F339 Major depressive disorder, recurrent, unspecified: Secondary | ICD-10-CM | POA: Diagnosis present

## 2020-01-10 DIAGNOSIS — J9601 Acute respiratory failure with hypoxia: Secondary | ICD-10-CM | POA: Diagnosis present

## 2020-01-10 DIAGNOSIS — J9600 Acute respiratory failure, unspecified whether with hypoxia or hypercapnia: Secondary | ICD-10-CM | POA: Diagnosis not present

## 2020-01-10 DIAGNOSIS — Z743 Need for continuous supervision: Secondary | ICD-10-CM | POA: Diagnosis not present

## 2020-01-10 DIAGNOSIS — R0602 Shortness of breath: Secondary | ICD-10-CM | POA: Diagnosis not present

## 2020-01-10 DIAGNOSIS — I10 Essential (primary) hypertension: Secondary | ICD-10-CM | POA: Diagnosis present

## 2020-01-10 DIAGNOSIS — Z7982 Long term (current) use of aspirin: Secondary | ICD-10-CM

## 2020-01-10 DIAGNOSIS — Z79899 Other long term (current) drug therapy: Secondary | ICD-10-CM

## 2020-01-10 LAB — CBC WITH DIFFERENTIAL/PLATELET
Abs Immature Granulocytes: 0.04 10*3/uL (ref 0.00–0.07)
Basophils Absolute: 0 10*3/uL (ref 0.0–0.1)
Basophils Relative: 1 %
Eosinophils Absolute: 0 10*3/uL (ref 0.0–0.5)
Eosinophils Relative: 0 %
HCT: 28.3 % — ABNORMAL LOW (ref 36.0–46.0)
Hemoglobin: 8.6 g/dL — ABNORMAL LOW (ref 12.0–15.0)
Immature Granulocytes: 1 %
Lymphocytes Relative: 13 %
Lymphs Abs: 0.5 10*3/uL — ABNORMAL LOW (ref 0.7–4.0)
MCH: 31.9 pg (ref 26.0–34.0)
MCHC: 30.4 g/dL (ref 30.0–36.0)
MCV: 104.8 fL — ABNORMAL HIGH (ref 80.0–100.0)
Monocytes Absolute: 0.5 10*3/uL (ref 0.1–1.0)
Monocytes Relative: 12 %
Neutro Abs: 2.9 10*3/uL (ref 1.7–7.7)
Neutrophils Relative %: 73 %
Platelets: 99 10*3/uL — ABNORMAL LOW (ref 150–400)
RBC: 2.7 MIL/uL — ABNORMAL LOW (ref 3.87–5.11)
RDW: 18.6 % — ABNORMAL HIGH (ref 11.5–15.5)
WBC: 3.9 10*3/uL — ABNORMAL LOW (ref 4.0–10.5)
nRBC: 0 % (ref 0.0–0.2)

## 2020-01-10 LAB — COMPREHENSIVE METABOLIC PANEL
ALT: 19 U/L (ref 0–44)
AST: 35 U/L (ref 15–41)
Albumin: 3 g/dL — ABNORMAL LOW (ref 3.5–5.0)
Alkaline Phosphatase: 72 U/L (ref 38–126)
Anion gap: 16 — ABNORMAL HIGH (ref 5–15)
BUN: 36 mg/dL — ABNORMAL HIGH (ref 8–23)
CO2: 24 mmol/L (ref 22–32)
Calcium: 8.6 mg/dL — ABNORMAL LOW (ref 8.9–10.3)
Chloride: 94 mmol/L — ABNORMAL LOW (ref 98–111)
Creatinine, Ser: 10.54 mg/dL — ABNORMAL HIGH (ref 0.44–1.00)
GFR calc Af Amer: 4 mL/min — ABNORMAL LOW (ref >60)
GFR calc non Af Amer: 3 mL/min — ABNORMAL LOW (ref >60)
Glucose, Bld: 77 mg/dL (ref 70–99)
Potassium: 3.8 mmol/L (ref 3.5–5.1)
Sodium: 134 mmol/L — ABNORMAL LOW (ref 135–145)
Total Bilirubin: 1.1 mg/dL (ref 0.3–1.2)
Total Protein: 5.4 g/dL — ABNORMAL LOW (ref 6.5–8.1)

## 2020-01-10 LAB — LACTATE DEHYDROGENASE: LDH: 198 U/L — ABNORMAL HIGH (ref 98–192)

## 2020-01-10 LAB — LACTIC ACID, PLASMA
Lactic Acid, Venous: 1.2 mmol/L (ref 0.5–1.9)
Lactic Acid, Venous: 0.9 mmol/L (ref 0.5–1.9)

## 2020-01-10 LAB — C-REACTIVE PROTEIN: CRP: 1.5 mg/dL — ABNORMAL HIGH (ref <1.0)

## 2020-01-10 LAB — FERRITIN: Ferritin: 2184 ng/mL — ABNORMAL HIGH (ref 11–307)

## 2020-01-10 LAB — PROCALCITONIN: Procalcitonin: 0.5 ng/mL

## 2020-01-10 LAB — D-DIMER, QUANTITATIVE: D-Dimer, Quant: 0.51 ug/mL-FEU — ABNORMAL HIGH (ref 0.00–0.50)

## 2020-01-10 LAB — FIBRINOGEN: Fibrinogen: 368 mg/dL (ref 210–475)

## 2020-01-10 LAB — TRIGLYCERIDES: Triglycerides: 107 mg/dL (ref <150)

## 2020-01-10 MED ORDER — ALBUTEROL SULFATE HFA 108 (90 BASE) MCG/ACT IN AERS
1.0000 | INHALATION_SPRAY | Freq: Four times a day (QID) | RESPIRATORY_TRACT | Status: DC | PRN
  Filled 2020-01-10: qty 6.7

## 2020-01-10 MED ORDER — PANTOPRAZOLE SODIUM 20 MG PO TBEC
20.0000 mg | DELAYED_RELEASE_TABLET | Freq: Two times a day (BID) | ORAL | Status: DC
  Administered 2020-01-11 – 2020-01-31 (×42): 20 mg via ORAL
  Filled 2020-01-10 (×44): qty 1

## 2020-01-10 MED ORDER — ISOSORBIDE MONONITRATE ER 60 MG PO TB24
60.0000 mg | ORAL_TABLET | Freq: Every day | ORAL | Status: DC
  Administered 2020-01-11 – 2020-01-20 (×7): 60 mg via ORAL
  Filled 2020-01-10 (×2): qty 1
  Filled 2020-01-10 (×2): qty 2
  Filled 2020-01-10 (×7): qty 1

## 2020-01-10 MED ORDER — ZINC SULFATE 220 (50 ZN) MG PO CAPS
220.0000 mg | ORAL_CAPSULE | Freq: Every day | ORAL | Status: DC
  Administered 2020-01-11 – 2020-01-31 (×21): 220 mg via ORAL
  Filled 2020-01-10 (×21): qty 1

## 2020-01-10 MED ORDER — SODIUM CHLORIDE 0.9 % IV SOLN
200.0000 mg | Freq: Once | INTRAVENOUS | Status: AC
  Administered 2020-01-11: 200 mg via INTRAVENOUS
  Filled 2020-01-10: qty 200

## 2020-01-10 MED ORDER — SENNOSIDES-DOCUSATE SODIUM 8.6-50 MG PO TABS: 1.0000 | ORAL_TABLET | Freq: Every evening | ORAL | Status: DC | PRN

## 2020-01-10 MED ORDER — GUAIFENESIN-DM 100-10 MG/5ML PO SYRP
10.0000 mL | ORAL_SOLUTION | ORAL | Status: DC | PRN
  Administered 2020-01-22: 10 mL via ORAL
  Filled 2020-01-10 (×2): qty 10

## 2020-01-10 MED ORDER — SODIUM CHLORIDE 0.9 % IV SOLN
100.0000 mg | INTRAVENOUS | Status: AC
  Administered 2020-01-11 – 2020-01-14 (×4): 100 mg via INTRAVENOUS
  Filled 2020-01-10 (×4): qty 20

## 2020-01-10 MED ORDER — RENA-VITE PO TABS
1.0000 | ORAL_TABLET | Freq: Every day | ORAL | Status: DC
  Administered 2020-01-11 – 2020-01-30 (×21): 1 via ORAL
  Filled 2020-01-10 (×23): qty 1

## 2020-01-10 MED ORDER — ASPIRIN 81 MG PO CHEW
81.0000 mg | CHEWABLE_TABLET | Freq: Every day | ORAL | Status: DC
  Administered 2020-01-11 – 2020-01-31 (×20): 81 mg via ORAL
  Filled 2020-01-10 (×20): qty 1

## 2020-01-10 MED ORDER — AMLODIPINE BESYLATE 5 MG PO TABS
5.0000 mg | ORAL_TABLET | Freq: Every day | ORAL | Status: DC
  Administered 2020-01-12 – 2020-01-20 (×6): 5 mg via ORAL
  Filled 2020-01-10 (×9): qty 1

## 2020-01-10 MED ORDER — SODIUM CHLORIDE 0.9 % IV SOLN: 100.0000 mg | Freq: Every day | INTRAVENOUS | Status: DC

## 2020-01-10 MED ORDER — IPRATROPIUM BROMIDE HFA 17 MCG/ACT IN AERS
2.0000 | INHALATION_SPRAY | Freq: Four times a day (QID) | RESPIRATORY_TRACT | Status: DC
  Administered 2020-01-11 – 2020-01-14 (×9): 2 via RESPIRATORY_TRACT
  Filled 2020-01-10 (×2): qty 12.9

## 2020-01-10 MED ORDER — INSULIN DETEMIR 100 UNIT/ML ~~LOC~~ SOLN
20.0000 [IU] | Freq: Two times a day (BID) | SUBCUTANEOUS | Status: DC
  Administered 2020-01-11 – 2020-01-15 (×7): 20 [IU] via SUBCUTANEOUS
  Filled 2020-01-10 (×13): qty 0.2

## 2020-01-10 MED ORDER — DEXAMETHASONE SODIUM PHOSPHATE 10 MG/ML IJ SOLN
10.0000 mg | Freq: Once | INTRAMUSCULAR | Status: AC
  Administered 2020-01-11: 10 mg via INTRAVENOUS
  Filled 2020-01-10: qty 1

## 2020-01-10 MED ORDER — MELATONIN 5 MG PO TABS
10.0000 mg | ORAL_TABLET | Freq: Every day | ORAL | Status: DC
  Administered 2020-01-11 – 2020-01-30 (×19): 10 mg via ORAL
  Filled 2020-01-10 (×25): qty 2

## 2020-01-10 MED ORDER — ACETAMINOPHEN 325 MG PO TABS
650.0000 mg | ORAL_TABLET | Freq: Four times a day (QID) | ORAL | Status: DC | PRN
  Administered 2020-01-12 – 2020-01-28 (×3): 650 mg via ORAL
  Filled 2020-01-10 (×3): qty 2

## 2020-01-10 MED ORDER — ACETAMINOPHEN 650 MG RE SUPP
650.0000 mg | Freq: Once | RECTAL | Status: AC
  Administered 2020-01-10: 650 mg via RECTAL
  Filled 2020-01-10: qty 1

## 2020-01-10 MED ORDER — HYDROCOD POLST-CPM POLST ER 10-8 MG/5ML PO SUER
5.0000 mL | Freq: Two times a day (BID) | ORAL | Status: DC | PRN
  Filled 2020-01-10: qty 5

## 2020-01-10 MED ORDER — INSULIN ASPART 100 UNIT/ML ~~LOC~~ SOLN
0.0000 [IU] | Freq: Every day | SUBCUTANEOUS | Status: DC
  Administered 2020-01-11: 2 [IU] via SUBCUTANEOUS
  Administered 2020-01-16: 3 [IU] via SUBCUTANEOUS
  Administered 2020-01-18: 2 [IU] via SUBCUTANEOUS

## 2020-01-10 MED ORDER — ATORVASTATIN CALCIUM 10 MG PO TABS
20.0000 mg | ORAL_TABLET | Freq: Every day | ORAL | Status: DC
  Administered 2020-01-11 – 2020-01-30 (×21): 20 mg via ORAL
  Filled 2020-01-10 (×21): qty 2

## 2020-01-10 MED ORDER — INSULIN ASPART 100 UNIT/ML ~~LOC~~ SOLN
0.0000 [IU] | Freq: Three times a day (TID) | SUBCUTANEOUS | Status: DC
  Administered 2020-01-11: 1 [IU] via SUBCUTANEOUS
  Administered 2020-01-11 (×2): 3 [IU] via SUBCUTANEOUS
  Administered 2020-01-12: 5 [IU] via SUBCUTANEOUS
  Administered 2020-01-12: 3 [IU] via SUBCUTANEOUS
  Administered 2020-01-13: 1 [IU] via SUBCUTANEOUS
  Administered 2020-01-14: 2 [IU] via SUBCUTANEOUS
  Administered 2020-01-18: 3 [IU] via SUBCUTANEOUS
  Administered 2020-01-18: 2 [IU] via SUBCUTANEOUS
  Administered 2020-01-19: 1 [IU] via SUBCUTANEOUS

## 2020-01-10 MED ORDER — DEXAMETHASONE SODIUM PHOSPHATE 10 MG/ML IJ SOLN
6.0000 mg | INTRAMUSCULAR | Status: DC
  Administered 2020-01-11 – 2020-01-16 (×6): 6 mg via INTRAVENOUS
  Filled 2020-01-10 (×7): qty 1

## 2020-01-10 MED ORDER — SODIUM CHLORIDE 0.9 % IV SOLN: 200.0000 mg | Freq: Once | INTRAVENOUS | Status: DC

## 2020-01-10 MED ORDER — APIXABAN 2.5 MG PO TABS
2.5000 mg | ORAL_TABLET | Freq: Two times a day (BID) | ORAL | Status: DC
  Administered 2020-01-11 – 2020-01-25 (×31): 2.5 mg via ORAL
  Filled 2020-01-10 (×32): qty 1

## 2020-01-10 MED ORDER — METOPROLOL TARTRATE 25 MG PO TABS
75.0000 mg | ORAL_TABLET | Freq: Two times a day (BID) | ORAL | Status: DC
  Administered 2020-01-11 – 2020-01-13 (×6): 75 mg via ORAL
  Filled 2020-01-10 (×7): qty 3

## 2020-01-10 MED ORDER — ASCORBIC ACID 500 MG PO TABS
500.0000 mg | ORAL_TABLET | Freq: Every day | ORAL | Status: DC
  Administered 2020-01-11 – 2020-01-31 (×21): 500 mg via ORAL
  Filled 2020-01-10 (×21): qty 1

## 2020-01-10 NOTE — ED Notes (Addendum)
IV arrived at bedside. Temperature recheck to be completed after IV is finished.

## 2020-01-10 NOTE — ED Triage Notes (Signed)
BIB [facility] w/ c/o of weakness. Pt is covid positive. BP 143/71 P-84 CBG-116 SpO2 88% on RA but placed on 2 L o2 [place] and improved to 100 . Pt is AOx1 per family and EMS.

## 2020-01-10 NOTE — ED Notes (Signed)
Notified EDP of delay with getting labs, rectal temp and administering suppository.

## 2020-01-10 NOTE — ED Provider Notes (Signed)
Patient signed out from Dr. Vanita Panda.  69 year old female here weakness hypoxia fever recently diagnosed with Covid.  This was 3 days ago.  Requiring oxygen and will need admission to the hospital.  Plan is to results and contact admitting team Physical Exam  BP 129/64   Pulse 68   Temp (!) 102.9 F (39.4 C) (Rectal)   Resp 20   Ht 5\' 5"  (1.651 m)   Wt 69.9 kg   SpO2 100%   BMI 25.64 kg/m   Physical Exam  ED Course/Procedures     Procedures  MDM  8 PM IV team was able to draw her blood work.  Discussed with [facility] hospitalist Dr. Tonie Griffith who will evaluate the patient for admission.  I also discussed with Dr. Joelyn Oms nephrology who will see her tomorrow for dialysis.  Have ordered her Decadron and remdesivir per pharmacy consult.       Hayden Rasmussen, MD 01/11/20 1052

## 2020-01-10 NOTE — ED Notes (Signed)
IV cath will not thread. Will attempt to use small syringe to draw back from 22 g

## 2020-01-10 NOTE — H&P (Signed)
History and Physical    Robin [name] OEV:[id number] DOB: November 15, 1950 DOA: 01/10/2020  [facility]P: [facility] [place]   Patient coming from: Home  Chief Complaint: Shortness of breath, decreased responsiveness  HPI: Robin [name] is a 69 y.o. female with medical history significant for diabetes mellitus, hypertension, end-stage renal disease on dialysis Tuesday Thursday Saturday, bipolar disorder, hypertension, dementia presents by EMS with decreased responsiveness and shortness of breath.  Reportedly patient was seen at the [place] yesterday and diagnosed with Covid and was discharged home.  She had worsening shortness of breath today and was then sent to the emergency room.  Daughter reported to EMS that patient has been more lethargic and less responsive than her normal state.  She normally is not very verbal and cannot provide any history secondary to her history of dementia.  She was found to be hypoxic with O2 sats in the mid 80s on room air when EMS arrived.  No report of any fever, vomiting, diarrhea syncope, chest pain.  ED Course: Robin [name] is placed on oxygen by nasal cannula in the emergency room.  Oxygen saturation is greater than 92% on oxygen.  Patient is unable to do any history secondary to her dementia.  Review of Systems:  Review of system cannot be obtained secondary to dementia  Past Medical History:  Diagnosis Date  . Acute delirium 11/18/2014  . Acute encephalopathy   . Acute on chronic respiratory failure with hypoxia ([place]) 09/09/2016  . Acute respiratory failure with hypoxia ([place]) 11/29/2015  . Anemia in chronic kidney disease 12/09/2014  . Anxiety   . Arthritis   . Benign hypertension   . Bipolar affective disorder ([place]) 12/09/2014  . CAD (coronary artery disease), native coronary artery with 2 stents  11/17/2014  . Charcot foot due to diabetes mellitus ([place])   . Chronic combined systolic and diastolic CHF (congestive heart failure) ([place]) 11/18/2014  . Closed left ankle  fracture 11/17/2014  . Confusion 01/21/2015  . Depression   . Diabetes mellitus without complication ([place])   . End stage renal disease on dialysis ([place])   . Fracture dislocation of ankle 11/17/2014  . GERD (gastroesophageal reflux disease)   . Heart murmur   . History of blood transfusion   . History of bronchitis   . History of pneumonia   . Hyperlipidemia 03/26/2015  . Hypertension associated with diabetes ([place]) 11/18/2014  . Hypertensive heart/renal disease with failure ([place]) 12/09/2014  . Hypokalemia 11/17/2014  . Multiple falls 01/21/2015  . Obesity 11/17/2014  . Onychomycosis 10/07/2016  . Right leg DVT ([place]) 12/05/2015  . SIRS (systemic inflammatory response syndrome) ([place]) 08/08/2016  . Sleep apnea   . Unstable angina ([place]) 12/26/2017  . Ventricular tachycardia [facility])     Past Surgical History:  Procedure Laterality Date  . A/V FISTULAGRAM N/A 01/03/2020   Procedure: A/V FISTULAGRAM;  Surgeon: Serafina Mitchell, MD;  Location: [facility];  Service: Cardiovascular;  Laterality: N/A;  . ABDOMINAL HYSTERECTOMY    . ANKLE CLOSED REDUCTION N/A 11/17/2014   Procedure: CLOSED REDUCTION ANKLE;  Surgeon: Earlie Server, MD;  Location: [facility];  Service: Orthopedics;  Laterality: N/A;  . ANKLE FUSION Left 03/16/2015   Procedure: Left Tibiocalcaneal Fusion;  Surgeon: Newt Minion, MD;  Location: [facility];  Service: Orthopedics;  Laterality: Left;  . APPLICATION OF WOUND VAC Right 08/06/2016   Procedure: APPLICATION OF PREVENA WOUND VAC;  Surgeon: Newt Minion, MD;  Location: [facility];  Service: Orthopedics;  Laterality: Right;  . AV FISTULA PLACEMENT Left [id number]   done at [facility]     2 stent   . CHOLECYSTECTOMY    . CORONARY STENT PLACEMENT    . FOOT ARTHRODESIS Right 08/06/2016   Procedure: Right Foot Fusion Lisfranc Joint;  Surgeon: Newt Minion, MD;  Location: [facility];  Service: Orthopedics;  Laterality: Right;  . HARDWARE REMOVAL Left 03/16/2015    Procedure: Removal Hardware Left Ankle;  Surgeon: Newt Minion, MD;  [facility];  Service: Orthopedics;  Laterality: Left;  . IR AV DIALY SHUNT INTRO NEEDLE/INTRACATH INITIAL W/PTA/IMG LEFT  01/05/2018  . IR US GUIDE VASC ACCESS LEFT  01/05/2018  . LEFT HEART CATH AND CORONARY ANGIOGRAPHY N/A 12/28/2017   Procedure: LEFT HEART CATH AND CORONARY ANGIOGRAPHY;  Surgeon: Burnell Blanks, MD;  Location: [facility];  Service: Cardiovascular;  Laterality: N/A;  . ORIF ANKLE FRACTURE Left 11/20/2014   Procedure: OPEN REDUCTION INTERNAL FIXATION (ORIF) ANKLE FRACTURE;  Surgeon: Renette Butters, MD;  Location: [facility];  Service: Orthopedics;  Laterality: Left;  . PERIPHERAL VASCULAR BALLOON ANGIOPLASTY  01/03/2020   Procedure: PERIPHERAL VASCULAR BALLOON ANGIOPLASTY;  Surgeon: Serafina Mitchell, MD;  Location: [facility];  Service: Cardiovascular;;  . TONSILLECTOMY      Social History  reports that she has been smoking cigarettes. She has been smoking about 1.00 pack per day. She has never used smokeless tobacco. She reports that she does not drink alcohol and does not use drugs.  No Known Allergies  Family History  Family history unknown: Yes     Prior to Admission medications   Medication Sig Start Date End Date Taking? Authorizing Provider  acetaminophen (TYLENOL) 325 MG tablet Take 325-975 mg by mouth every 6 (six) hours as needed for mild pain or headache.    [provider]  albuterol (VENTOLIN HFA) 108 (90 Base) MCG/ACT inhaler Inhale 1-2 puffs into the lungs every 6 (six) hours as needed for wheezing or shortness of breath.    [provider]  amLODipine (NORVASC) 5 MG tablet Take 1 tablet (5 mg total) by mouth daily. 09/18/18   Chundi, Verne Spurr, MD  ammonium lactate (LAC-HYDRIN) 12 % lotion Apply 1 application topically 2 (two) times daily as needed (for itching/irritation).     [provider]  ARIPiprazole (ABILIFY) 15 MG tablet Take 7.5 mg  by mouth daily.     [provider]  aspirin 81 MG chewable tablet Chew 1 tablet (81 mg total) by mouth daily. 12/30/17   Asencion Noble, MD  atorvastatin (LIPITOR) 40 MG tablet Take 20 mg by mouth at bedtime.     [provider]  AURYXIA 1 GM 210 MG(Fe) tablet Take 210 mg by mouth daily. 02/22/19   [provider]  dextrose (GLUTOSE) 40 % GEL Take 1 Tube by mouth once as needed (for blood sugar less than 60).     [provider]  ELIQUIS 5 MG TABS tablet Take 2.5 mg by mouth 2 (two) times daily. 10/21/18   [provider]  gabapentin (NEURONTIN) 300 MG capsule Take 1 capsule (300 mg total) by mouth 2 (two) times daily. 06/24/19   Suzan Slick, NP  HYDROcodone-acetaminophen (NORCO) 5-325 MG tablet Take 1 tablet by mouth every 6 (six) hours as needed. 11/18/19   Persons, Bevely Palmer, PA  insulin detemir (LEVEMIR) 100 UNIT/ML injection Inject 0.08 mLs (8 Units total) into the skin  at bedtime. Patient taking differently: Inject 20 Units into the skin 2 (two) times daily.  09/15/16   Oswald Hillock, MD  insulin lispro (HUMALOG) 100 UNIT/ML injection Inject 3-15 Units into the skin 3 (three) times daily before meals. Pt uses as needed per sliding scale:    150-200:  3  units 201-250:  5 units 251-300:  8 units 301-350:  10 units 351-400:  12 units and call MD 400+ 15 units    [provider]  isosorbide mononitrate (IMDUR) 60 MG 24 hr tablet Take 60 mg by mouth daily.     [provider]  Melatonin 5 MG TABS Take 10 mg by mouth at bedtime.     [provider]  metoprolol tartrate 75 MG TABS Take 75 mg by mouth 2 (two) times daily. 04/17/19   Daisy Floro, DO  multivitamin (RENA-VIT) TABS tablet Take 1 tablet by mouth at bedtime.  12/11/17   [provider]  nitroGLYCERIN (NITROSTAT) 0.4 MG SL tablet Place 0.4 mg under the tongue every 5 (five) minutes as needed for chest pain.    [provider]  pantoprazole  (PROTONIX) 20 MG tablet Take 20 mg by mouth 2 (two) times daily.     [provider]    Physical Exam: Vitals:   01/10/20 1654 01/10/20 1711 01/10/20 1726 01/10/20 1900  BP:  129/64  132/77  Pulse:   68   Resp:   20 13  Temp: (!) 102.9 F (39.4 C)     TempSrc: Rectal     SpO2:   100%   Weight:      Height:        Constitutional: NAD, calm, comfortable Vitals:   01/10/20 1654 01/10/20 1711 01/10/20 1726 01/10/20 1900  BP:  129/64  132/77  Pulse:   68   Resp:   20 13  Temp: (!) 102.9 F (39.4 C)     TempSrc: Rectal     SpO2:   100%   Weight:      Height:       General: WDWN, lethargic.  Opens eyes to voice but does not verbally respond. Eyes: PERRL, lids and conjunctivae normal.  Sclera nonicteric HENT:  /AT, external ears normal.  Nares patent without epistasis.  Mucous membranes are moist. Posterior pharynx clear of any exudate or lesions.   Neck: Soft, normal passive range of motion, supple, no masses, no thyromegaly.  Trachea midline Respiratory: Equal breath sounds, mildly diminished.  Mild diffuse rales.  No wheezing, no crackles. Normal respiratory effort. No accessory muscle use.  Cardiovascular: Regular rate and rhythm, no murmurs / rubs / gallops.  Mild extremity edema. 1+ pedal pulses.  Dialysis AV fistula in left upper arm with palpable thrill. Abdomen: Soft, no tenderness, nondistended, no rebound or guarding.  No masses palpated.  Bowel sounds normoactive Musculoskeletal: FROM. no clubbing / cyanosis. No joint deformity upper and lower extremities. Normal muscle tone.  Skin: Warm, dry, intact no rashes, lesions, ulcers. No induration Neurologic: Moves all 4 extremities spontaneously.  Withdraws from pain.  Labs on Admission: I have personally reviewed following labs and imaging studies  CBC: Recent Labs  Lab 01/08/20 2012 01/08/20 2021 01/10/20 1955  WBC 5.2  --  3.9*  NEUTROABS 3.6  --  2.9  HGB 9.7* 10.5* 8.6*  HCT 32.4* 31.0* 28.3*  MCV  108.4*  --  104.8*  PLT 129*  --  99*    Basic Metabolic Panel: Recent Labs  Lab 01/08/20 2012 01/08/20 2021 01/10/20 1955  NA 136 135 134*  K 4.1 4.1 3.8  CL 94* 95* 94*  CO2 29  --  24  GLUCOSE 138* 131* 77  BUN 16 17 36*  CREATININE 6.98* 6.60* 10.54*  CALCIUM 9.1  --  8.6*    GFR: Estimated Creatinine Clearance: 5.1 mL/min (A) (by C-G formula based on SCr of 10.54 mg/dL (H)).  Liver Function Tests: Recent Labs  Lab 01/08/20 2012 01/10/20 1955  AST 26 35  ALT 16 19  ALKPHOS 78 72  BILITOT 0.9 1.1  PROT 5.9* 5.4*  ALBUMIN 3.1* 3.0*    Urine analysis:    Component Value Date/Time   COLORURINE YELLOW 09/09/2016 0337   APPEARANCEUR HAZY (A) 09/09/2016 0337   LABSPEC 1.010 09/09/2016 0337   PHURINE 5.0 09/09/2016 [place] 09/09/2016 0337   HGBUR NEGATIVE 09/09/2016 0337   BILIRUBINUR NEGATIVE 09/09/2016 0337   KETONESUR NEGATIVE 09/09/2016 0337   PROTEINUR 100 (A) 09/09/2016 0337   UROBILINOGEN 0.2 01/21/2015 0416   NITRITE NEGATIVE 09/09/2016 0337   LEUKOCYTESUR NEGATIVE 09/09/2016 0337    Radiological Exams on Admission: DG Chest Port 1 View  Result Date: 01/10/2020 CLINICAL DATA:  Hypoxia, shortness of breath, COVID-19 positive EXAM: PORTABLE CHEST 1 VIEW COMPARISON:  09/30/2019 FINDINGS: Single frontal view of the chest demonstrates persistent enlargement of the cardiac silhouette. There is chronic interstitial prominence and mild central vascular congestion. No focal airspace disease, effusion, or pneumothorax. No acute bony abnormalities. IMPRESSION: 1. Chronic central vascular congestion and interstitial prominence. No acute airspace disease. Electronically Signed   By: [name] M.D.   On: 01/10/2020 16:10    EKG: Independently reviewed.  EKG shows normal sinus rhythm with LVH and occasional PVC.  QTC is prolonged at 496.  No acute ST elevation or depression.  Assessment/Plan Active Problems:   COVID-19 Ms. Nierenberg is admitted  to [place] floor and COVID-19 precautions.  Patient placed on remdesivir and dexamethasone.  Albuterol MDI as needed for shortness of breath, cough, wheeze.     Hypoxia Supplemental oxygen as needed to keep O2 sat between 92 to 96%.    Diabetes mellitus type 2, insulin dependent  Continue home basal insulin.  Blood sugars were monitored before every meal and nightly. SSI will be provided as needed for glycemic control.  Check hemoglobin A1c.    CAD (coronary artery disease), native coronary artery with 2 stents  Continue home cardiovascular medications    Hypertension due to end stage renal disease caused by type 2 diabetes mellitus, on dialysis ([place]) Continue home antihypertensive medications.  Monitor blood pressure.    ESRD on hemodialysis [facility]) Patient has dialysis Tuesday Thursday and Saturday.  She missed her dialysis session Tuesday morning.  Nephrology be consulted for dialysis in the morning.    Prolonged QT interval Chronic.  Avoid medications that can prolong QT interval      Dementia Chronic.    DVT prophylaxis: Patient is on Eliquis for anticoagulation Code Status:   Full code Family Communication:  No family is present at this time Disposition Plan:   Patient is from:  Home  Anticipated DC to:  Home  Anticipated DC date:  Anticipate greater than 2 midnight stay in hospital to treat acute medical condition  Consults called:  Nephrology Admission status:  Inpatient  Severity of Illness: The appropriate patient status for this patient is INPATIENT. Inpatient status is judged to be reasonable and necessary in order to provide  the required intensity of service to ensure the patient's safety. The patient's presenting symptoms, physical exam findings, and initial radiographic and laboratory data in the context of their chronic comorbidities is felt to place them at high risk for further clinical deterioration. Furthermore, it is not anticipated that the patient will be  medically stable for discharge from the hospital within 2 midnights of admission. The following factors support the patient status of inpatient.     * I certify that at the point of admission it is my clinical judgment that the patient will require inpatient hospital care spanning beyond 2 midnights from the point of admission due to high intensity of service, high risk for further deterioration and high frequency of surveillance required.Yevonne Aline Daryan Cagley MD [facility]  How to contact the [facility] Attending or Consulting provider [place] or covering provider during after hours [place], for this patient?   1. Check the care team in [facility] and look for a) attending/consulting [facility] provider listed and b) the [facility] team listed 2. Log into [email or web address] and use [place]'s universal password to access. If you do not have the password, please contact the hospital operator. 3. Locate the [facility] provider you are looking for under [facility] and page to a number that you can be directly reached. 4. If you still have difficulty reaching the provider, please page the [facility] (Director on Call) for the Hospitalists listed on amion for assistance.  01/10/2020, 10:39 PM

## 2020-01-10 NOTE — ED Notes (Signed)
Requested assistance from phlebotomy with labs while waiting on IV consult

## 2020-01-10 NOTE — ED Provider Notes (Signed)
[facility] EMERGENCY DEPARTMENT Provider Note   CSN: [id number] Arrival date & time: 01/10/20  1507     History No chief complaint on file.   Robin [name] is a 69 y.o. female.  HPI    Patient presents to ED with concern for weakness, hypoxia, fever, and Covid infection. Patient has multiple medical issues including end-stage renal disease, is on dialysis.  Last dialysis was 3 days ago, and today she missed her session due to this illness. History is obtained by EMS providers and patient herself is withdrawn, however is only her name, and brief verbal responses. Level 5 caveat secondary to mental status changes, delirium on dementia, the latter a known diagnosis for the patient. Per EMS the patient had been doing well until the past few days, when she had generalized illness prompting evaluation yesterday at the [facility].  There she was diagnosed with coronavirus, discharged from their emergency department. Today, with decline in condition, according to her daughter, EMS was notified prompting transport to this facility. EMS notes that the patient had oxygen saturation of 88% on room air in transport, but no hypotension, no substantial tachycardia.  Notably I evaluated this patient 1 week ago when she presented due to bleeding AV shunt.  She was admitted for evaluation, care at that time.  Past Medical History:  Diagnosis Date  . Acute delirium 11/18/2014  . Acute encephalopathy   . Acute on chronic respiratory failure with hypoxia ([place]) 09/09/2016  . Acute respiratory failure with hypoxia ([place]) 11/29/2015  . Anemia in chronic kidney disease 12/09/2014  . Anxiety   . Arthritis   . Benign hypertension   . Bipolar affective disorder ([place]) 12/09/2014  . CAD (coronary artery disease), native coronary artery with 2 stents  11/17/2014  . Charcot foot due to diabetes mellitus ([place])   . Chronic combined systolic and diastolic CHF (congestive heart failure)  ([place]) 11/18/2014  . Closed left ankle fracture 11/17/2014  . Confusion 01/21/2015  . Depression   . Diabetes mellitus without complication ([place])   . End stage renal disease on dialysis ([place])   . Fracture dislocation of ankle 11/17/2014  . GERD (gastroesophageal reflux disease)   . Heart murmur   . History of blood transfusion   . History of bronchitis   . History of pneumonia   . Hyperlipidemia 03/26/2015  . Hypertension associated with diabetes ([place]) 11/18/2014  . Hypertensive heart/renal disease with failure ([place]) 12/09/2014  . Hypokalemia 11/17/2014  . Multiple falls 01/21/2015  . Obesity 11/17/2014  . Onychomycosis 10/07/2016  . Right leg DVT ([place]) 12/05/2015  . SIRS (systemic inflammatory response syndrome) ([place]) 08/08/2016  . Sleep apnea   . Unstable angina ([place]) 12/26/2017  . Ventricular tachycardia [name])     Patient Active Problem List   Diagnosis Date Noted  . Chest pain 10/01/2019  . History of pulmonary embolism 10/01/2019  . Prolonged QT interval 10/01/2019  . Hypertensive urgency   . Other headache syndrome   . Orthostatic dizziness 04/15/2019  . Bradycardia 04/15/2019  . Syncope   . ESRD on hemodialysis ([place])   . Chest pain at rest 09/16/2018  . Diabetic Charct's arthropathy ([place]) 10/07/2016  . GERD (gastroesophageal reflux disease) 09/09/2016  . Depression 09/09/2016  . Chronic diastolic (congestive) heart failure ([place]) 09/09/2016  . Elevated troponin 09/09/2016  . Hyperlipidemia associated with type 2 diabetes mellitus ([place]) 03/26/2015  . Charcot ankle 03/16/2015  . Anemia, chronic renal failure   . NSVT (nonsustained ventricular  tachycardia) ([place])   . Hypertension due to end stage renal disease caused by type 2 diabetes mellitus, on dialysis ([place]) 12/09/2014  . Bipolar affective disorder ([place]) 12/09/2014  . Diabetes mellitus type 2, insulin dependent ([place]) 11/17/2014  . CAD (coronary artery disease), native coronary artery with 2 stents  11/17/2014    Past  Surgical History:  Procedure Laterality Date  . A/V FISTULAGRAM N/A 01/03/2020   Procedure: A/V FISTULAGRAM;  Surgeon: Serafina Mitchell, MD;  Location: [facility] CV [facility];  Service: Cardiovascular;  Laterality: N/A;  . ABDOMINAL HYSTERECTOMY    . ANKLE CLOSED REDUCTION N/A 11/17/2014   Procedure: CLOSED REDUCTION ANKLE;  Surgeon: Earlie Server, MD;  Location: [facility];  Service: Orthopedics;  Laterality: N/A;  . ANKLE FUSION Left 03/16/2015   Procedure: Left Tibiocalcaneal Fusion;  Surgeon: Newt Minion, MD;  Location: [facility];  Service: Orthopedics;  Laterality: Left;  . APPLICATION OF WOUND VAC Right 08/06/2016   Procedure: APPLICATION OF PREVENA WOUND VAC;  Surgeon: Newt Minion, MD;  Location: [facility];  Service: Orthopedics;  Laterality: Right;  . AV FISTULA PLACEMENT Left [id number]   done at [facility]     2 stent   . CHOLECYSTECTOMY    . CORONARY STENT PLACEMENT    . FOOT ARTHRODESIS Right 08/06/2016   Procedure: Right Foot Fusion Lisfranc Joint;  Surgeon: Newt Minion, MD;  Location: [facility];  Service: Orthopedics;  Laterality: Right;  . HARDWARE REMOVAL Left 03/16/2015   Procedure: Removal Hardware Left Ankle;  Surgeon: Newt Minion, MD;  Location: [facility];  Service: Orthopedics;  Laterality: Left;  . IR AV DIALY SHUNT INTRO NEEDLE/INTRACATH INITIAL W/PTA/IMG LEFT  01/05/2018  . IR US GUIDE VASC ACCESS LEFT  01/05/2018  . LEFT HEART CATH AND CORONARY ANGIOGRAPHY N/A 12/28/2017   Procedure: LEFT HEART CATH AND CORONARY ANGIOGRAPHY;  Surgeon: Burnell Blanks, MD;  Location: [facility];  Service: Cardiovascular;  Laterality: N/A;  . ORIF ANKLE FRACTURE Left 11/20/2014   Procedure: OPEN REDUCTION INTERNAL FIXATION (ORIF) ANKLE FRACTURE;  Surgeon: Renette Butters, MD;  Location: [facility];  Service: Orthopedics;  Laterality: Left;  . PERIPHERAL VASCULAR BALLOON ANGIOPLASTY  01/03/2020   Procedure: PERIPHERAL VASCULAR BALLOON ANGIOPLASTY;  Surgeon: Serafina Mitchell, MD;  Location: [facility];  Service: Cardiovascular;;  . TONSILLECTOMY       OB History   No obstetric history on file.     Family History  Family history unknown: Yes    Social History   Tobacco Use  . Smoking status: Current Every Day Smoker    Packs/day: 1.00    Types: Cigarettes  . Smokeless tobacco: Never Used  Substance Use Topics  . Alcohol use: No  . Drug use: No    Home Medications Prior to Admission medications   Medication Sig Start Date End Date Taking? Authorizing Provider  acetaminophen (TYLENOL) 325 MG tablet Take 325-975 mg by mouth every 6 (six) hours as needed for mild pain or headache.    [provider]  albuterol (VENTOLIN HFA) 108 (90 Base) MCG/ACT inhaler Inhale 1-2 puffs into the lungs every 6 (six) hours as needed for wheezing or shortness of breath.    [provider]  amLODipine (NORVASC) 5 MG tablet Take 1 tablet (5 mg total) by mouth daily. 09/18/18   Chundi, Verne Spurr, MD  ammonium lactate (LAC-HYDRIN) 12 % lotion Apply 1 application topically 2 (two) times daily as  needed (for itching/irritation).     [provider]  ARIPiprazole (ABILIFY) 15 MG tablet Take 7.5 mg by mouth daily.     [provider]  aspirin 81 MG chewable tablet Chew 1 tablet (81 mg total) by mouth daily. 12/30/17   Asencion Noble, MD  atorvastatin (LIPITOR) 40 MG tablet Take 20 mg by mouth at bedtime.     [provider]  AURYXIA 1 GM 210 MG(Fe) tablet Take 210 mg by mouth daily. 02/22/19   [provider]  dextrose (GLUTOSE) 40 % GEL Take 1 Tube by mouth once as needed (for blood sugar less than 60).     [provider]  ELIQUIS 5 MG TABS tablet Take 2.5 mg by mouth 2 (two) times daily. 10/21/18   [provider]  gabapentin (NEURONTIN) 300 MG capsule Take 1 capsule (300 mg total) by mouth 2 (two) times daily. 06/24/19   Suzan Slick, NP  HYDROcodone-acetaminophen (NORCO) 5-325 MG tablet Take 1  tablet by mouth every 6 (six) hours as needed. 11/18/19   Persons, Bevely Palmer, PA  insulin detemir (LEVEMIR) 100 UNIT/ML injection Inject 0.08 mLs (8 Units total) into the skin at bedtime. Patient taking differently: Inject 20 Units into the skin 2 (two) times daily.  09/15/16   Oswald Hillock, MD  insulin lispro (HUMALOG) 100 UNIT/ML injection Inject 3-15 Units into the skin 3 (three) times daily before meals. Pt uses as needed per sliding scale:    150-200:  3  units 201-250:  5 units 251-300:  8 units 301-350:  10 units 351-400:  12 units and call MD 400+ 15 units    [provider]  isosorbide mononitrate (IMDUR) 60 MG 24 hr tablet Take 60 mg by mouth daily.     [provider]  Melatonin 5 MG TABS Take 10 mg by mouth at bedtime.     [provider]  metoprolol tartrate 75 MG TABS Take 75 mg by mouth 2 (two) times daily. 04/17/19   Daisy Floro, DO  multivitamin (RENA-VIT) TABS tablet Take 1 tablet by mouth at bedtime.  12/11/17   [provider]  nitroGLYCERIN (NITROSTAT) 0.4 MG SL tablet Place 0.4 mg under the tongue every 5 (five) minutes as needed for chest pain.    [provider]  pantoprazole (PROTONIX) 20 MG tablet Take 20 mg by mouth 2 (two) times daily.     [provider]    Allergies    Patient has no known allergies.  Review of Systems   Review of Systems  Unable to perform ROS: Mental status change    Physical Exam Updated Vital Signs Ht 5\' 5"  (1.651 m)   Wt 69.9 kg   BMI 25.64 kg/m   Physical Exam Vitals and nursing note reviewed.  Constitutional:      General: She is not in acute distress.    Appearance: She is well-developed. She is ill-appearing.  HENT:     Head: Normocephalic and atraumatic.  Eyes:     Conjunctiva/sclera: Conjunctivae normal.  Cardiovascular:     Rate and Rhythm: Normal rate and regular rhythm.  Pulmonary:     Effort: Pulmonary effort is normal. No respiratory distress.      Breath sounds: Normal breath sounds. No stridor.  Abdominal:     General: There is no distension.  Musculoskeletal:       Arms:  Skin:    General: Skin is warm and dry.  Neurological:     Mental Status: She is alert.     Cranial Nerves: No cranial nerve deficit.     Motor: Atrophy present.     Comments: Patient oriented to self, minimally interactive, listless, but does move all extremities spontaneously.  Psychiatric:        Behavior: Behavior is slowed and withdrawn.        Cognition and Memory: Cognition is impaired. Memory is impaired.     ED Results / Procedures / Treatments   Labs (all labs ordered are listed, but only abnormal results are displayed) Labs Reviewed - No data to display  EKG None  Radiology CT HEAD WO CONTRAST  Result Date: 01/08/2020 CLINICAL DATA:  Mental status change EXAM: CT HEAD WITHOUT CONTRAST TECHNIQUE: Contiguous axial images were obtained from the base of the skull through the vertex without intravenous contrast. COMPARISON:  CT head dated 09/30/2019 FINDINGS: Brain: No evidence of acute infarction, hemorrhage, hydrocephalus, extra-axial collection or mass lesion/mass effect. There is mild cerebral volume loss with associated ex vacuo dilatation. Periventricular white matter hypoattenuation likely represents chronic small vessel ischemic disease. Vascular: There are vascular calcifications in the carotid siphons. Skull: Normal. Negative for fracture or focal lesion. Sinuses/Orbits: No acute finding. Other: None. IMPRESSION: No acute intracranial process. Electronically Signed   By: Zerita Boers M.D.   On: 01/08/2020 20:42   DG Chest Port 1 View  Result Date: 01/10/2020 CLINICAL DATA:  Hypoxia, shortness of breath, COVID-19 positive EXAM: PORTABLE CHEST 1 VIEW COMPARISON:  09/30/2019 FINDINGS: Single frontal view of the chest demonstrates persistent enlargement of the cardiac silhouette. There is chronic interstitial prominence and mild central  vascular congestion. No focal airspace disease, effusion, or pneumothorax. No acute bony abnormalities. IMPRESSION: 1. Chronic central vascular congestion and interstitial prominence. No acute airspace disease. Electronically Signed   By: [name] M.D.   On: 01/10/2020 16:10    Procedures Procedures (including critical care time)  Medications Ordered in ED Medications - No data to display       ED Course  I have reviewed the triage vital signs and the nursing notes.  Pertinent labs & imaging results that were available during my care of the patient were reviewed by me and considered in my medical decision making (see chart for details).  This adult female with multiple medical issues including end-stage renal disease presents with concern for Covid infection, listlessness. Here the patient is found afebrile, hypoxic, concerning for SIRS in the context of known coronavirus infection. Patient's history of end-stage renal disease, multiple other medical problems concerning, she will likely require admission for further monitoring, management after labs are available. Initial interventions include supplemental oxygen, Tylenol. Case discussed with Dr. Melina Copa for completion of care. Final Clinical Impression(s) / ED Diagnoses Final diagnoses:  SIRS (systemic inflammatory response syndrome) ([place])  COVID-19     Carmin Muskrat, MD 01/10/20 1626

## 2020-01-11 LAB — COMPREHENSIVE METABOLIC PANEL
ALT: 16 U/L (ref 0–44)
AST: 35 U/L (ref 15–41)
Albumin: 2.6 g/dL — ABNORMAL LOW (ref 3.5–5.0)
Alkaline Phosphatase: 65 U/L (ref 38–126)
Anion gap: 14 (ref 5–15)
BUN: 38 mg/dL — ABNORMAL HIGH (ref 8–23)
CO2: 23 mmol/L (ref 22–32)
Calcium: 8.3 mg/dL — ABNORMAL LOW (ref 8.9–10.3)
Chloride: 97 mmol/L — ABNORMAL LOW (ref 98–111)
Creatinine, Ser: 11.09 mg/dL — ABNORMAL HIGH (ref 0.44–1.00)
GFR calc Af Amer: 4 mL/min — ABNORMAL LOW (ref >60)
GFR calc non Af Amer: 3 mL/min — ABNORMAL LOW (ref >60)
Glucose, Bld: 146 mg/dL — ABNORMAL HIGH (ref 70–99)
Potassium: 3.7 mmol/L (ref 3.5–5.1)
Sodium: 134 mmol/L — ABNORMAL LOW (ref 135–145)
Total Bilirubin: 0.5 mg/dL (ref 0.3–1.2)
Total Protein: 5.4 g/dL — ABNORMAL LOW (ref 6.5–8.1)

## 2020-01-11 LAB — MAGNESIUM: Magnesium: 2.4 mg/dL (ref 1.7–2.4)

## 2020-01-11 LAB — CBC WITH DIFFERENTIAL/PLATELET
Abs Immature Granulocytes: 0.01 10*3/uL (ref 0.00–0.07)
Basophils Absolute: 0 10*3/uL (ref 0.0–0.1)
Basophils Relative: 1 %
Eosinophils Absolute: 0 10*3/uL (ref 0.0–0.5)
Eosinophils Relative: 0 %
HCT: 29.3 % — ABNORMAL LOW (ref 36.0–46.0)
Hemoglobin: 8.9 g/dL — ABNORMAL LOW (ref 12.0–15.0)
Immature Granulocytes: 0 %
Lymphocytes Relative: 12 %
Lymphs Abs: 0.4 10*3/uL — ABNORMAL LOW (ref 0.7–4.0)
MCH: 32 pg (ref 26.0–34.0)
MCHC: 30.4 g/dL (ref 30.0–36.0)
MCV: 105.4 fL — ABNORMAL HIGH (ref 80.0–100.0)
Monocytes Absolute: 0.2 10*3/uL (ref 0.1–1.0)
Monocytes Relative: 5 %
Neutro Abs: 2.8 10*3/uL (ref 1.7–7.7)
Neutrophils Relative %: 82 %
Platelets: 87 10*3/uL — ABNORMAL LOW (ref 150–400)
RBC: 2.78 MIL/uL — ABNORMAL LOW (ref 3.87–5.11)
RDW: 18.2 % — ABNORMAL HIGH (ref 11.5–15.5)
WBC: 3.4 10*3/uL — ABNORMAL LOW (ref 4.0–10.5)
nRBC: 0 % (ref 0.0–0.2)

## 2020-01-11 LAB — CBG MONITORING, ED
Glucose-Capillary: 147 mg/dL — ABNORMAL HIGH (ref 70–99)
Glucose-Capillary: 196 mg/dL — ABNORMAL HIGH (ref 70–99)
Glucose-Capillary: 244 mg/dL — ABNORMAL HIGH (ref 70–99)
Glucose-Capillary: 64 mg/dL — ABNORMAL LOW (ref 70–99)

## 2020-01-11 LAB — HEMOGLOBIN A1C
Hgb A1c MFr Bld: 8.1 % — ABNORMAL HIGH (ref 4.8–5.6)
Mean Plasma Glucose: 185.77 mg/dL

## 2020-01-11 LAB — FERRITIN: Ferritin: 1413 ng/mL — ABNORMAL HIGH (ref 11–307)

## 2020-01-11 LAB — C-REACTIVE PROTEIN: CRP: 1.6 mg/dL — ABNORMAL HIGH (ref <1.0)

## 2020-01-11 LAB — BRAIN NATRIURETIC PEPTIDE: B Natriuretic Peptide: 2572 pg/mL — ABNORMAL HIGH (ref 0.0–100.0)

## 2020-01-11 LAB — D-DIMER, QUANTITATIVE: D-Dimer, Quant: 0.48 ug/mL-FEU (ref 0.00–0.50)

## 2020-01-11 MED ORDER — DARBEPOETIN ALFA 40 MCG/0.4ML IJ SOSY
40.0000 ug | PREFILLED_SYRINGE | INTRAMUSCULAR | Status: DC
  Filled 2020-01-11: qty 0.4

## 2020-01-11 MED ORDER — CHLORHEXIDINE GLUCONATE CLOTH 2 % EX PADS
6.0000 | MEDICATED_PAD | Freq: Every day | CUTANEOUS | Status: DC
  Administered 2020-01-13 – 2020-01-30 (×15): 6 via TOPICAL

## 2020-01-11 MED ORDER — CALCITRIOL 0.5 MCG PO CAPS
2.5000 ug | ORAL_CAPSULE | ORAL | Status: DC
  Administered 2020-01-12 – 2020-01-28 (×5): 2.5 ug via ORAL
  Filled 2020-01-11: qty 10
  Filled 2020-01-11 (×3): qty 5
  Filled 2020-01-11 (×2): qty 10

## 2020-01-11 MED ORDER — LOPERAMIDE HCL 2 MG PO CAPS
2.0000 mg | ORAL_CAPSULE | Freq: Four times a day (QID) | ORAL | Status: DC | PRN
  Administered 2020-01-11: 2 mg via ORAL
  Filled 2020-01-11: qty 1

## 2020-01-11 NOTE — ED Notes (Signed)
Lunch tray ordered 

## 2020-01-11 NOTE — ED Notes (Signed)
Pt had another episode of diarrhea, pt cleaned and new brief applied. Admitting MD made aware of 3 episodes of diarrhea today.

## 2020-01-11 NOTE — ED Notes (Signed)
Robin [name] (pt daughter) provided update at this time.

## 2020-01-11 NOTE — ED Notes (Signed)
Breakfast Ordered 

## 2020-01-11 NOTE — ED Notes (Signed)
Pt had 1 episode of diarrhea. Pt cleaned, all linen changed. Pt placed on hospital bed for comfort. Pt sitting up eating her breakfast at this time, nad noted

## 2020-01-11 NOTE — Consult Note (Signed)
ESRD Consult Note [facility]  Requesting provider: Thurnell Lose, MD Reason for consult: ESRD, provision of dialysis  Outpatient dialysis unit: [facility] Outpatient dialysis schedule: TTS  Assessment/Recommendations: Robin [name] is a/an 69 y.o. female with a past medical history notable for ESRD on HD admitted with SOB and decreased responsiveness who presents with COVID.   1. ESRD: Outpatient orders: 3.5hrs, f180, bfr 400, dfr 1.5, edw 74.5kg, 2k 2cal, 137, 35, uf profile #1 HD today and then again tomorrow to maintain TTS schedule  2. COVID-19, hypoxia -per primary. On remdesivir and dexamethasone. Albuterol prn, supplemental O2  3. Volume/ hypertension: EDW 74.5kg. Will attempt to UF as tolerated, will try for 2-3L as tolerated with uf profile today.  4. Anemia of Chronic Kidney Disease:  -no iron, start aranesp today qweekly  5. Secondary Hyperparathyroidism/Hyperphosphatemia: calcitriol 2.1mcg qtreatment   6. Vascular access: LUE AVF  Additional recommendations: - Dose all meds for creatinine clearance < 10 ml/min  - Unless absolutely necessary, no MRIs with gadolinium.  - Implement save arm precautions.  Prefer needle sticks in the dorsum of the hands or wrists.  No blood pressure measurements in arm. - If blood transfusion is requested during hemodialysis sessions, please alert [place] prior to the session.  - If a hemodialysis catheter line culture is requested, please alert [place] as only hemodialysis nurses are able to collect those specimens.   Recommendations were discussed with the primary team.  Gean Quint, MD [facility]  History of Present Illness: Robin [name] is a 69 y.o. female with a past medical history of ESRD, DM2, HTN, bipolar d/o, dementia who presents with decreased responsiveness and SOB. Was seen at the [place] yesterday and diagnosed with COVID then discharged however he condition worsened hence her return to the ER. Found  to be hypoxic with O2 sats in the 80's on RA. Missed HD 8/17. ROS unobtainable secondary to underlying dementia. Data obtained from chart review and from primary service.   Medications:  Current Facility-Administered Medications  Medication Dose Route Frequency Provider Last Rate Last Admin  . acetaminophen (TYLENOL) tablet 650 mg  650 mg Oral Q6H PRN Chotiner, Yevonne Aline, MD      . albuterol (VENTOLIN HFA) 108 (90 Base) MCG/ACT inhaler 1-2 puff  1-2 puff Inhalation Q6H PRN Chotiner, Yevonne Aline, MD      . amLODipine (NORVASC) tablet 5 mg  5 mg Oral Daily Chotiner, Yevonne Aline, MD      . apixaban Arne [place]) tablet 2.5 mg  2.5 mg Oral BID Chotiner, Yevonne Aline, MD   2.5 mg at 01/11/20 0012  . ascorbic acid (VITAMIN C) tablet 500 mg  500 mg Oral Daily Chotiner, Yevonne Aline, MD      . aspirin chewable tablet 81 mg  81 mg Oral Daily Chotiner, Yevonne Aline, MD      . atorvastatin (LIPITOR) tablet 20 mg  20 mg Oral QHS Chotiner, Yevonne Aline, MD   20 mg at 01/11/20 0012  . chlorpheniramine-HYDROcodone (TUSSIONEX) 10-8 MG/5ML suspension 5 mL  5 mL Oral Q12H PRN Chotiner, Yevonne Aline, MD      . dexamethasone (DECADRON) injection 6 mg  6 mg Intravenous Q24H Chotiner, Yevonne Aline, MD      . guaiFENesin-dextromethorphan (ROBITUSSIN DM) 100-10 MG/5ML syrup 10 mL  10 mL Oral Q4H PRN Chotiner, Yevonne Aline, MD      . insulin aspart (novoLOG) injection 0-5 Units  0-5 Units Subcutaneous QHS Chotiner, Yevonne Aline, MD      .  insulin aspart (novoLOG) injection 0-9 Units  0-9 Units Subcutaneous TID WC Chotiner, Yevonne Aline, MD      . insulin detemir (LEVEMIR) injection 20 Units  20 Units Subcutaneous BID Chotiner, Yevonne Aline, MD      . ipratropium (ATROVENT HFA) inhaler 2 puff  2 puff Inhalation Q6H Chotiner, Yevonne Aline, MD      . isosorbide mononitrate (IMDUR) 24 hr tablet 60 mg  60 mg Oral Daily Chotiner, Yevonne Aline, MD      . melatonin tablet 10 mg  10 mg Oral QHS Chotiner, Yevonne Aline, MD      . metoprolol tartrate (LOPRESSOR) tablet 75 mg   75 mg Oral BID Chotiner, Yevonne Aline, MD   75 mg at 01/11/20 0016  . multivitamin (RENA-VIT) tablet 1 tablet  1 tablet Oral QHS Chotiner, Yevonne Aline, MD   1 tablet at 01/11/20 0012  . pantoprazole (PROTONIX) EC tablet 20 mg  20 mg Oral BID Chotiner, Yevonne Aline, MD   20 mg at 01/11/20 0012  . remdesivir 100 mg in sodium chloride 0.9 % 100 mL IVPB  100 mg Intravenous Q24H Rumbarger, Valeda Malm, RPH      . senna-docusate (Senokot-S) tablet 1 tablet  1 tablet Oral QHS PRN Chotiner, Yevonne Aline, MD      . zinc sulfate capsule 220 mg  220 mg Oral Daily Chotiner, Yevonne Aline, MD       Current Outpatient Medications  Medication Sig Dispense Refill  . acetaminophen (TYLENOL) 325 MG tablet Take 325-975 mg by mouth every 6 (six) hours as needed for mild pain or headache.    . albuterol (VENTOLIN HFA) 108 (90 Base) MCG/ACT inhaler Inhale 1-2 puffs into the lungs every 6 (six) hours as needed for wheezing or shortness of breath.    Marland Kitchen amLODipine (NORVASC) 5 MG tablet Take 1 tablet (5 mg total) by mouth daily. 90 tablet 1  . ammonium lactate (LAC-HYDRIN) 12 % lotion Apply 1 application topically 2 (two) times daily as needed (for itching/irritation).     . ARIPiprazole (ABILIFY) 15 MG tablet Take 7.5 mg by mouth daily.     Marland Kitchen aspirin 81 MG chewable tablet Chew 1 tablet (81 mg total) by mouth daily. 30 tablet 3  . atorvastatin (LIPITOR) 40 MG tablet Take 20 mg by mouth at bedtime.     Lorin Picket 1 GM 210 MG(Fe) tablet Take 210 mg by mouth daily.    Marland Kitchen dextrose (GLUTOSE) 40 % GEL Take 1 Tube by mouth once as needed (for blood sugar less than 60).     Marland Kitchen ELIQUIS 5 MG TABS tablet Take 2.5 mg by mouth 2 (two) times daily.    Marland Kitchen gabapentin (NEURONTIN) 300 MG capsule Take 1 capsule (300 mg total) by mouth 2 (two) times daily. 60 capsule 3  . HYDROcodone-acetaminophen (NORCO) 5-325 MG tablet Take 1 tablet by mouth every 6 (six) hours as needed. 20 tablet 0  . insulin detemir (LEVEMIR) 100 UNIT/ML injection Inject 0.08 mLs (8 Units  total) into the skin at bedtime. (Patient taking differently: Inject 20 Units into the skin 2 (two) times daily. ) 10 mL 11  . insulin lispro (HUMALOG) 100 UNIT/ML injection Inject 3-15 Units into the skin 3 (three) times daily before meals. Pt uses as needed per sliding scale:    150-200:  3  units 201-250:  5 units 251-300:  8 units 301-350:  10 units 351-400:  12 units and call MD 400+ 15 units    .  isosorbide mononitrate (IMDUR) 60 MG 24 hr tablet Take 60 mg by mouth daily.     . Melatonin 5 MG TABS Take 10 mg by mouth at bedtime.     . metoprolol tartrate 75 MG TABS Take 75 mg by mouth 2 (two) times daily. 60 tablet 0  . multivitamin (RENA-VIT) TABS tablet Take 1 tablet by mouth at bedtime.   10  . nitroGLYCERIN (NITROSTAT) 0.4 MG SL tablet Place 0.4 mg under the tongue every 5 (five) minutes as needed for chest pain.    . pantoprazole (PROTONIX) 20 MG tablet Take 20 mg by mouth 2 (two) times daily.        ALLERGIES Patient has no known allergies.  MEDICAL HISTORY Past Medical History:  Diagnosis Date  . Acute delirium 11/18/2014  . Acute encephalopathy   . Acute on chronic respiratory failure with hypoxia ([place]) 09/09/2016  . Acute respiratory failure with hypoxia ([place]) 11/29/2015  . Anemia in chronic kidney disease 12/09/2014  . Anxiety   . Arthritis   . Benign hypertension   . Bipolar affective disorder ([place]) 12/09/2014  . CAD (coronary artery disease), native coronary artery with 2 stents  11/17/2014  . Charcot foot due to diabetes mellitus ([place])   . Chronic combined systolic and diastolic CHF (congestive heart failure) ([place]) 11/18/2014  . Closed left ankle fracture 11/17/2014  . Confusion 01/21/2015  . Depression   . Diabetes mellitus without complication ([place])   . End stage renal disease on dialysis ([place])   . Fracture dislocation of ankle 11/17/2014  . GERD (gastroesophageal reflux disease)   . Heart murmur   . History of blood transfusion   . History of bronchitis   .  History of pneumonia   . Hyperlipidemia 03/26/2015  . Hypertension associated with diabetes ([place]) 11/18/2014  . Hypertensive heart/renal disease with failure ([place]) 12/09/2014  . Hypokalemia 11/17/2014  . Multiple falls 01/21/2015  . Obesity 11/17/2014  . Onychomycosis 10/07/2016  . Right leg DVT ([place]) 12/05/2015  . SIRS (systemic inflammatory response syndrome) ([place]) 08/08/2016  . Sleep apnea   . Unstable angina ([place]) 12/26/2017  . Ventricular tachycardia ([place])      SOCIAL HISTORY Social History   Socioeconomic History  . Marital status: Divorced    Spouse name: Not on file  . Number of children: Not on file  . Years of education: Not on file  . Highest education level: Not on file  Occupational History  . Not on file  Tobacco Use  . Smoking status: Current Every Day Smoker    Packs/day: 1.00    Types: Cigarettes  . Smokeless tobacco: Never Used  Substance and Sexual Activity  . Alcohol use: No  . Drug use: No  . Sexual activity: Never  Other Topics Concern  . Not on file  Social History Narrative  . Not on file   Social Determinants of Health   Financial Resource Strain:   . Difficulty of Paying Living Expenses:   Food Insecurity:   . Worried About Charity fundraiser in the Last Year:   . Arboriculturist in the Last Year:   Transportation Needs:   . Film/video editor (Medical):   Marland Kitchen Lack of Transportation (Non-Medical):   Physical Activity:   . Days of Exercise per Week:   . Minutes of Exercise per Session:   Stress:   . Feeling of Stress :   Social Connections:   . Frequency of Communication with Friends and Family:   .  Frequency of Social Gatherings with Friends and Family:   . Attends Religious Services:   . Active Member of Clubs or Organizations:   . Attends Archivist Meetings:   Marland Kitchen Marital Status:   Intimate Partner Violence:   . Fear of Current or Ex-Partner:   . Emotionally Abused:   Marland Kitchen Physically Abused:   . Sexually Abused:       FAMILY HISTORY Family History  Family history unknown: Yes     Review of Systems: 12 systems were reviewed and negative except per HPI  Physical Exam: Vitals:   01/11/20 0400 01/11/20 0430  BP: 123/63 (!) 114/47  Pulse: 61   Resp: 10 17  Temp:    SpO2: 92%    No intake/output data recorded.  Intake/Output Summary (Last 24 hours) at 01/11/2020 0703 Last data filed at 01/11/2020 0135 Gross per 24 hour  Intake 250 ml  Output --  Net 250 ml   LIMITED EXAM IN THE SETTING OF THE COVID-19 PANDEMIC AND TO CONSERVE PPE General: lethargic, noncommunicative CV: normal rate Lungs: bilateral chest rise, normal wob Abd: nondistended Skin: no visible lesions or rashes MSK: LUE avf Neuro: noncommunicative, moves all ext spontaneously  Test Results Reviewed Lab Results  Component Value Date   NA 134 (L) 01/11/2020   K 3.7 01/11/2020   CL 97 (L) 01/11/2020   CO2 23 01/11/2020   BUN 38 (H) 01/11/2020   CREATININE 11.09 (H) 01/11/2020   CALCIUM 8.3 (L) 01/11/2020   ALBUMIN 2.6 (L) 01/11/2020   PHOS 4.0 04/17/2019    I have reviewed relevant healthcare records. Reviewed records on [facility].

## 2020-01-11 NOTE — ED Notes (Signed)
Pt returned from dialysis, nad noted.

## 2020-01-11 NOTE — Progress Notes (Signed)
PROGRESS NOTE                                                                                                                                                                                                             Patient Demographics:    Robin [name], is a 69 y.o. female, DOB - 06-Sep-1950, HRC:[id number]  Outpatient Primary [facility] the [facility] is [facility] [place] [facility]  Admit date - 01/10/2020    CC - SOB     Brief Narrative  - Robin [name] is a 69 y.o. female with medical history significant for diabetes mellitus, hypertension, end-stage renal disease on dialysis Tuesday Thursday Saturday, bipolar disorder, hypertension, dementia presents by EMS with decreased responsiveness and shortness of breath, she was diagnosed with Covid infection at the [place] facility on 01/10/2020 but was sent home as she was doing well.  Patient is quite demented and much of the history was obtained by the admitting physician through family.  She was admitted for the treatment of COVID-19 pneumonia.   Subjective:    Robin [name] today has, No headache, No chest pain, No abdominal pain - No Nausea, No new weakness tingling or numbness, no Cough - SOB.    Assessment  & Plan :      1. Acute Hypoxic Resp. Failure due to Acute Covid 19 Viral Pneumonitis during the ongoing 2020 Covid 19 Pandemic - she is unvaccinated, so far appears to have mild to moderate disease, has been started on steroid and remdesivir combination which should be continued.  Encouraged the patient to sit up in chair in the daytime use I-S and flutter valve for pulmonary toiletry and then prone in bed when at night.  Will advance activity and titrate down oxygen as possible.  Actemra off label use - patient was told that if COVID-19 pneumonitis gets worse we might potentially use Actemra off label, patient denies any known history of active diverticulitis, tuberculosis or  hepatitis, understands the risks and benefits and wants to proceed with Actemra treatment if required.  Convalescent plasma  - Patient was also informed in detail about convalescent plasma use, patient has consented for the same.  SpO2: 92 % O2 Flow Rate (L/min): 2 L/min   Recent Labs  Lab 01/08/20 2012 01/10/20 1955 01/10/20 2212 01/11/20 0441 01/11/20  0817  WBC 5.2 3.9*  --  3.4*  --   CRP  --  1.5*  --  1.6*  --   DDIMER  --  0.51*  --   --  0.48  BNP  --   --   --   --  2,572.0*  PROCALCITON  --   --  0.50  --   --   LATICACIDVEN  --  1.2 0.9  --   --   AST 26 35  --  35  --   ALT 16 19  --  16  --   ALKPHOS 78 72  --  65  --   BILITOT 0.9 1.1  --  0.5  --   ALBUMIN 3.1* 3.0*  --  2.6*  --   INR 1.4*  --   --   --   --        2.  ESRD.  On TTS schedule.  Nephrology on board.  3.  Dementia.  At risk for delirium.  Supportive care.  4.  CAD.  S/p 2 stents in the past.  Chest pain-free, to new combination of aspirin, statin, beta-blocker, Imdur and Eliquis.  5.  Hypertension.  Currently stable on combination of Norvasc, beta-blocker, Imdur.  Continue and monitor.  6.  Dyslipidemia.  On statin.  7.  DM type II.  On combination of Levemir and sliding scale will monitor and adjust.  Lab Results  Component Value Date   HGBA1C 8.1 (H) 01/10/2020   CBG (last 3)  Recent Labs    01/09/20 0011 01/11/20 0031 01/11/20 0855  GLUCAP 104* 64* 147*      Condition -   Guarded  Family Communication  :  Daughter Judson Roch [phone]  01/11/20 - 7:57 am  Code Status :  Full  Consults  :  Renal  Procedures  :  None  PUD Prophylaxis : PPI  Disposition Plan  :    Status is: Inpatient  Remains inpatient appropriate because:IV treatments appropriate due to intensity of illness or inability to take PO   Dispo: The patient is from: Home              Anticipated d/c is to: SNF              Anticipated d/c date is: 3 days              Patient currently is not  medically stable to d/c.   DVT Prophylaxis  :  Eliquis  Lab Results  Component Value Date   PLT 87 (L) 01/11/2020    Diet :  Diet Order            Diet renal/carb modified with fluid restriction Diet-HS Snack? Nothing; Fluid restriction: 1200 mL Fluid; Room service appropriate? Yes; Fluid consistency: Thin  Diet effective now                  Inpatient Medications  Scheduled Meds: . amLODipine  5 mg Oral Daily  . apixaban  2.5 mg Oral BID  . vitamin C  500 mg Oral Daily  . aspirin  81 mg Oral Daily  . atorvastatin  20 mg Oral QHS  . dexamethasone (DECADRON) injection  6 mg Intravenous Q24H  . insulin aspart  0-5 Units Subcutaneous QHS  . insulin aspart  0-9 Units Subcutaneous TID WC  . insulin detemir  20 Units Subcutaneous BID  . ipratropium  2 puff Inhalation Q6H  . isosorbide  mononitrate  60 mg Oral Daily  . melatonin  10 mg Oral QHS  . metoprolol tartrate  75 mg Oral BID  . multivitamin  1 tablet Oral QHS  . pantoprazole  20 mg Oral BID  . zinc sulfate  220 mg Oral Daily   Continuous Infusions: . remdesivir 100 mg in NS 100 mL     PRN Meds:.acetaminophen, albuterol, chlorpheniramine-HYDROcodone, guaiFENesin-dextromethorphan, senna-docusate  Antibiotics  :    Anti-infectives (From admission, onward)   Start     Dose/Rate Route Frequency Ordered Stop   01/11/20 1000  remdesivir 100 mg in sodium chloride 0.9 % 100 mL IVPB     Discontinue    "Followed by" Linked Group Details   100 mg 200 mL/hr over 30 Minutes Intravenous Every 24 hours 01/10/20 2129 01/15/20 0959   01/11/20 1000  remdesivir 100 mg in sodium chloride 0.9 % 100 mL IVPB  Status:  Discontinued       "Followed by" Linked Group Details   100 mg 200 mL/hr over 30 Minutes Intravenous Daily 01/10/20 2321 01/10/20 2339   01/10/20 2321  remdesivir 200 mg in sodium chloride 0.9% 250 mL IVPB  Status:  Discontinued       "Followed by" Linked Group Details   200 mg 580 mL/hr over 30 Minutes Intravenous  Once 01/10/20 2321 01/10/20 2339   01/10/20 2200  remdesivir 200 mg in sodium chloride 0.9% 250 mL IVPB       "Followed by" Linked Group Details   200 mg 580 mL/hr over 30 Minutes Intravenous Once 01/10/20 2129 01/11/20 0135       Time Spent in minutes  30   Lala Lund M.D on 01/11/2020 at 7:57 AM  To page go to [email or web address] - password [facility]  [facility] -  Office  [phone]     See all Orders from today for further details    Objective:   Vitals:   01/11/20 0315 01/11/20 0400 01/11/20 0430 01/11/20 0700  BP: (!) 100/57 123/63 (!) 114/47 (!) 127/57  Pulse: (!) 59 61  61  Resp: $Remo'17 10 17 13  'TRnlD$ Temp:      TempSrc:      SpO2: 98% 92%  92%  Weight:      Height:        Wt Readings from Last 3 Encounters:  01/10/20 69.9 kg  01/03/20 69.9 kg  11/23/19 69.9 kg     Intake/Output Summary (Last 24 hours) at 01/11/2020 0757 Last data filed at 01/11/2020 0135 Gross per 24 hour  Intake 250 ml  Output --  Net 250 ml     Physical Exam  Awake but confused, in no distress,, No new F.N deficits, Normal affect [place].AT,PERRAL Supple Neck,No JVD, No cervical lymphadenopathy appriciated.  Symmetrical Chest wall movement, Good air movement bilaterally, CTAB RRR,No Gallops,Rubs or new Murmurs, No Parasternal Heave +ve B.Sounds, Abd Soft, No tenderness, No organomegaly appriciated, No rebound - guarding or rigidity. No Cyanosis, Clubbing or edema, No new Rash or bruise      Data Review:    CBC Recent Labs  Lab 01/08/20 2012 01/08/20 2021 01/10/20 1955 01/11/20 0441  WBC 5.2  --  3.9* 3.4*  HGB 9.7* 10.5* 8.6* 8.9*  HCT 32.4* 31.0* 28.3* 29.3*  PLT 129*  --  99* 87*  MCV 108.4*  --  104.8* 105.4*  MCH 32.4  --  31.9 32.0  MCHC 29.9*  --  30.4 30.4  RDW 19.1*  --  18.6* 18.2*  LYMPHSABS 0.6*  --  0.5* 0.4*  MONOABS 0.8  --  0.5 0.2  EOSABS 0.0  --  0.0 0.0  BASOSABS 0.0  --  0.0 0.0    Chemistries  Recent Labs  Lab 01/08/20 2012 01/08/20 2021  01/10/20 1605 01/10/20 1955 01/11/20 0441  NA 136 135  --  134* 134*  K 4.1 4.1  --  3.8 3.7  CL 94* 95*  --  94* 97*  CO2 29  --   --  24 23  GLUCOSE 138* 131*  --  77 146*  BUN 16 17  --  36* 38*  CREATININE 6.98* 6.60*  --  10.54* 11.09*  CALCIUM 9.1  --   --  8.6* 8.3*  AST 26  --   --  35 35  ALT 16  --   --  19 16  ALKPHOS 78  --   --  72 65  BILITOT 0.9  --   --  1.1 0.5  INR 1.4*  --   --   --   --   HGBA1C  --   --  8.1*  --   --      ------------------------------------------------------------------------------------------------------------------ Recent Labs    01/10/20 1955  TRIG 107    Lab Results  Component Value Date   HGBA1C 8.1 (H) 01/10/2020   ------------------------------------------------------------------------------------------------------------------ No results for input(s): TSH, T4TOTAL, T3FREE, THYROIDAB in the last 72 hours.  Invalid input(s): FREET3  Cardiac Enzymes No results for input(s): CKMB, TROPONINI, MYOGLOBIN in the last 168 hours.  Invalid input(s): CK ------------------------------------------------------------------------------------------------------------------    Component Value Date/Time   BNP 1,379.8 (H) 09/08/2016 2314    Micro Results Recent Results (from the past 240 hour(s))  SARS Coronavirus 2 by RT PCR (hospital order, performed in [facility] hospital lab) Nasopharyngeal Nasopharyngeal Swab     Status: None   Collection Time: 01/03/20  2:11 PM   Specimen: Nasopharyngeal Swab  Result Value Ref Range Status   SARS Coronavirus 2 NEGATIVE NEGATIVE Final    Comment: (NOTE) SARS-CoV-2 target nucleic acids are NOT DETECTED.  The SARS-CoV-2 RNA is generally detectable in upper and lower respiratory specimens during the acute phase of infection. The lowest concentration of SARS-CoV-2 viral copies this assay can detect is 250 copies / mL. A negative result does not preclude SARS-CoV-2 infection and should not be used  as the sole basis for treatment or other patient management decisions.  A negative result may occur with improper specimen collection / handling, submission of specimen other than nasopharyngeal swab, presence of viral mutation(s) within the areas targeted by this assay, and inadequate number of viral copies (<250 copies / mL). A negative result must be combined with clinical observations, patient history, and epidemiological information.  Fact Sheet for Patients:   StrictlyIdeas.no  Fact Sheet for Healthcare Providers: BankingDealers.co.za  This test is not yet approved or  cleared by the [place] FDA and has been authorized for detection and/or diagnosis of SARS-CoV-2 by FDA under an Emergency Use Authorization (EUA).  This EUA will remain in effect (meaning this test can be used) for the duration of the COVID-19 declaration under Section 564(b)(1) of the Act, 21 U.S.C. section 360bbb-3(b)(1), unless the authorization is terminated or revoked sooner.  Performed at [facility], [place] [address] [place], [place] [address]   Blood Culture (routine x 2)     Status: None (Preliminary result)   Collection Time: 01/10/20  4:05 PM   Specimen: BLOOD  RIGHT FOREARM  Result Value Ref Range Status   Specimen Description BLOOD RIGHT FOREARM  Final   Special Requests   Final    BOTTLES DRAWN AEROBIC AND ANAEROBIC Blood Culture adequate volume   Culture   Final    NO GROWTH < 12 HOURS Performed at [facility], [place] [address], [place], [place] [address]    Report Status PENDING  Incomplete  Blood Culture (routine x 2)     Status: None (Preliminary result)   Collection Time: 01/10/20 10:12 PM   Specimen: BLOOD RIGHT HAND  Result Value Ref Range Status   Specimen Description BLOOD RIGHT HAND  Final   Special Requests   Final    BOTTLES DRAWN AEROBIC AND ANAEROBIC Blood Culture results may not be optimal due to an inadequate  volume of blood received in culture bottles   Culture   Final    NO GROWTH < 12 HOURS Performed at [facility], [place] [address] [place], [place] [address]    Report Status PENDING  Incomplete    Radiology Reports CT HEAD WO CONTRAST  Result Date: 01/08/2020 CLINICAL DATA:  Mental status change EXAM: CT HEAD WITHOUT CONTRAST TECHNIQUE: Contiguous axial images were obtained from the base of the skull through the vertex without intravenous contrast. COMPARISON:  CT head dated 09/30/2019 FINDINGS: Brain: No evidence of acute infarction, hemorrhage, hydrocephalus, extra-axial collection or mass lesion/mass effect. There is mild cerebral volume loss with associated ex vacuo dilatation. Periventricular white matter hypoattenuation likely represents chronic small vessel ischemic disease. Vascular: There are vascular calcifications in the carotid siphons. Skull: Normal. Negative for fracture or focal lesion. Sinuses/Orbits: No acute finding. Other: None. IMPRESSION: No acute intracranial process. Electronically Signed   By: Zerita Boers M.D.   On: 01/08/2020 20:42   CT ANKLE LEFT WO CONTRAST  Result Date: 12/21/2019 CLINICAL DATA:  Medial left ankle pain and swelling for 3 months. History of prior subtalar and tibiotalar fusion. EXAM: CT OF THE LEFT ANKLE WITHOUT CONTRAST TECHNIQUE: Multidetector CT imaging of the left ankle was performed according to the standard protocol. Multiplanar CT image reconstructions were also generated. COMPARISON:  Plain films of the left foot 07/16/2016 and plain films left ankle 11/18/2019. FINDINGS: Bones/Joint/Cartilage The patient has a retrograde nail in place for subtalar and tibiotalar fusion. There is extensive lucency about the nail throughout its length. Marked lucency is also present about a screw in the distal tibia. Two screws in the calcaneus are also in place without surrounding lucency. The hardware is intact. There is mild lateral angulation of the nail.  No bridging bone is seen across the tibiotalar or subtalar joints. The nail has mild lateral angulation of approximately 11 degrees at the level of the metaphysis of the tibia. There is lateral hindfoot angulation of 38 degrees. Two screws are in place in the medial malleolus for fracture fixation. The fracture is healed. The distal fibula has been resected. Bridging bone between the distal 1.5 cm of the fibula and tibia is identified. Ligaments Suboptimally assessed by CT. Muscles and Tendons No tendon entrapment.  Tendons appear intact. Soft tissues There is some subcutaneous edema present. No focal fluid collection or mass is identified. IMPRESSION: Status post subtalar and tibiotalar fusion. There is no bridging bone across either joint. Extensive lucency about the intramedullary nail and screw in the distal tibia is consistent with motion and loosening. Hindfoot valgus. Healed medial malleolar fracture with 2 fixation screws in place. Electronically Signed  By: Inge Rise M.D.   On: 12/21/2019 10:37   PERIPHERAL VASCULAR CATHETERIZATION  Result Date: 01/03/2020 Patient name: MICHAELIA BEILFUSS MRN: [id number] DOB: November 29, 1950 Sex: female 01/03/2020 Pre-operative Diagnosis: Bleeding from left upper arm dialysis graft Post-operative diagnosis:  Same Surgeon:  Annamarie Major Procedure Performed:  1.  Ultrasound guided access, left upper arm dialysis graft  2.  Shuntogram  3.  Balloon venoplasty, left axillary vein (peripheral)  4.  Conscious sedation, 25 minutes  Indications: The patient came to the ER today for the second time in 3 days for bleeding from her dialysis graft.  I have recommended shuntogram to evaluate for central stenosis. Procedure:  The patient was identified in the holding area and taken to room 8.  The patient was then placed supine on the table and prepped and draped in the usual sterile fashion.  A time out was called.  Conscious sedation was administered with the use of IV fentanyl and  Versed under continuous physician and nurse monitoring.  Heart rate, blood pressure, and oxygen saturations were continuously monitored.  Total sedation time was 25 minutes ultrasound was used to evaluate the fistula.  The vein was patent and compressible.  A digital ultrasound image was acquired.  The fistula was then accessed under ultrasound guidance using a micropuncture needle.  An 018 wire was then asvanced without resistance and a micropuncture sheath was placed.  Contrast injections were then performed through the sheath. Findings: The central venous system is widely patent.  A stent is visualized at the venous outflow tract.  Just beyond the stent there is a 80% stenosis within the axillary vein.  The arterial venous anastomosis is widely patent.  Intervention: After the above images were acquired the decision was made to proceed with intervention.  A 7 French sheath was inserted over a Bentson wire.  I used a Glidewire to cross the lesion.  I then proceeded with balloon venoplasty using an 8 x 40 Mustang balloon.  Follow-up imaging revealed improved but suboptimal result and so I upsized to a 12 x 40 Mustang balloon.  This was taken to 20 atm.  Follow-up imaging revealed complete resolution of the stenosis.  The access site was closed with a 4-0 Monocryl. Impression:  #1  Greater than 80% stenosis just beyond the venous outflow stent treated using an 8 mm followed by 12 mm balloon with residual stenosis less than 10%.  Theotis Burrow, M.D., FACS [facility] Office: [phone] Pager:  [phone]  DG Chest Port 1 View  Result Date: 01/10/2020 CLINICAL DATA:  Hypoxia, shortness of breath, COVID-19 positive EXAM: PORTABLE CHEST 1 VIEW COMPARISON:  09/30/2019 FINDINGS: Single frontal view of the chest demonstrates persistent enlargement of the cardiac silhouette. There is chronic interstitial prominence and mild central vascular congestion. No focal airspace disease,  effusion, or pneumothorax. No acute bony abnormalities. IMPRESSION: 1. Chronic central vascular congestion and interstitial prominence. No acute airspace disease. Electronically Signed   By: [name] M.D.   On: 01/10/2020 16:10

## 2020-01-11 NOTE — ED Notes (Signed)
Please call daughter Judson Roch for a status update [phone]

## 2020-01-11 NOTE — ED Notes (Signed)
This RN was informed by the NT that the pt's CBG was 64. This RN had the NT give pt a cup of juice will recheck CBG in 15 mins.

## 2020-01-12 LAB — C-REACTIVE PROTEIN: CRP: 2.2 mg/dL — ABNORMAL HIGH (ref <1.0)

## 2020-01-12 LAB — COMPREHENSIVE METABOLIC PANEL
ALT: 19 U/L (ref 0–44)
AST: 36 U/L (ref 15–41)
Albumin: 2.7 g/dL — ABNORMAL LOW (ref 3.5–5.0)
Alkaline Phosphatase: 69 U/L (ref 38–126)
Anion gap: 14 (ref 5–15)
BUN: 29 mg/dL — ABNORMAL HIGH (ref 8–23)
CO2: 26 mmol/L (ref 22–32)
Calcium: 9.2 mg/dL (ref 8.9–10.3)
Chloride: 96 mmol/L — ABNORMAL LOW (ref 98–111)
Creatinine, Ser: 7.11 mg/dL — ABNORMAL HIGH (ref 0.44–1.00)
GFR calc Af Amer: 6 mL/min — ABNORMAL LOW (ref >60)
GFR calc non Af Amer: 5 mL/min — ABNORMAL LOW (ref >60)
Glucose, Bld: 164 mg/dL — ABNORMAL HIGH (ref 70–99)
Potassium: 4.3 mmol/L (ref 3.5–5.1)
Sodium: 136 mmol/L (ref 135–145)
Total Bilirubin: 0.9 mg/dL (ref 0.3–1.2)
Total Protein: 5.6 g/dL — ABNORMAL LOW (ref 6.5–8.1)

## 2020-01-12 LAB — CBC WITH DIFFERENTIAL/PLATELET
Abs Immature Granulocytes: 0.02 10*3/uL (ref 0.00–0.07)
Basophils Absolute: 0 10*3/uL (ref 0.0–0.1)
Basophils Relative: 0 %
Eosinophils Absolute: 0 10*3/uL (ref 0.0–0.5)
Eosinophils Relative: 0 %
HCT: 30.8 % — ABNORMAL LOW (ref 36.0–46.0)
Hemoglobin: 9.4 g/dL — ABNORMAL LOW (ref 12.0–15.0)
Immature Granulocytes: 1 %
Lymphocytes Relative: 22 %
Lymphs Abs: 0.4 10*3/uL — ABNORMAL LOW (ref 0.7–4.0)
MCH: 31.8 pg (ref 26.0–34.0)
MCHC: 30.5 g/dL (ref 30.0–36.0)
MCV: 104.1 fL — ABNORMAL HIGH (ref 80.0–100.0)
Monocytes Absolute: 0.1 10*3/uL (ref 0.1–1.0)
Monocytes Relative: 7 %
Neutro Abs: 1.3 10*3/uL — ABNORMAL LOW (ref 1.7–7.7)
Neutrophils Relative %: 70 %
Platelets: 90 10*3/uL — ABNORMAL LOW (ref 150–400)
RBC: 2.96 MIL/uL — ABNORMAL LOW (ref 3.87–5.11)
RDW: 17.9 % — ABNORMAL HIGH (ref 11.5–15.5)
WBC: 1.8 10*3/uL — ABNORMAL LOW (ref 4.0–10.5)
nRBC: 0 % (ref 0.0–0.2)

## 2020-01-12 LAB — D-DIMER, QUANTITATIVE: D-Dimer, Quant: 0.45 ug/mL-FEU (ref 0.00–0.50)

## 2020-01-12 LAB — BRAIN NATRIURETIC PEPTIDE: B Natriuretic Peptide: >4500 pg/mL — ABNORMAL HIGH (ref 0.0–100.0)

## 2020-01-12 LAB — GLUCOSE, CAPILLARY
Glucose-Capillary: 278 mg/dL — ABNORMAL HIGH (ref 70–99)
Glucose-Capillary: 176 mg/dL — ABNORMAL HIGH (ref 70–99)

## 2020-01-12 LAB — CBG MONITORING, ED
Glucose-Capillary: 215 mg/dL — ABNORMAL HIGH (ref 70–99)
Glucose-Capillary: 165 mg/dL — ABNORMAL HIGH (ref 70–99)
Glucose-Capillary: 233 mg/dL — ABNORMAL HIGH (ref 70–99)

## 2020-01-12 LAB — MAGNESIUM: Magnesium: 2.2 mg/dL (ref 1.7–2.4)

## 2020-01-12 NOTE — Progress Notes (Signed)
PROGRESS NOTE                                                                                                                                                                                                             Patient Demographics:    Robin [name], is a 69 y.o. female, DOB - [birth year], UJW:[id number]  Outpatient Primary MD [facility] the [facility] is [facility] [place] date - 01/10/2020    CC - SOB     Brief Narrative  - Robin [name] is a 69 y.o. female with medical history significant for diabetes mellitus, hypertension, end-stage renal disease on dialysis Tuesday Thursday Saturday, bipolar disorder, hypertension, dementia presents by EMS with decreased responsiveness and shortness of breath, she was diagnosed with Covid infection at the [place] facility on 01/10/2020 but was sent home as she was doing well.  Patient is quite demented and much of the history was obtained by the admitting physician through family.  She was admitted for the treatment of COVID-19 pneumonia.   Subjective:   Patient in bed, appears comfortable, denies any headache, no fever, no chest pain or pressure, no shortness of breath , no abdominal pain. No focal weakness.    Assessment  & Plan :      1. Acute Hypoxic Resp. Failure due to Acute Covid 19 Viral Pneumonitis during the ongoing 2020 Covid 19 Pandemic - she is unvaccinated, so far appears to have mild to moderate disease, has been started on steroid and remdesivir combination which should be continued.  Encouraged the patient to sit up in chair in the daytime use I-S and flutter valve for pulmonary toiletry and then prone in bed when at night.  Will advance activity and titrate down oxygen as possible.  Actemra off label use - patient was told that if COVID-19 pneumonitis gets worse we might potentially use Actemra off label, patient denies any known history of active  diverticulitis, tuberculosis or hepatitis, understands the risks and benefits and wants to proceed with Actemra treatment if required.  Convalescent plasma  - Patient was also informed in detail about convalescent plasma use, patient has consented for the same.  SpO2: 99 % O2 Flow Rate (L/min): 2 L/min   Recent Labs  Lab 01/08/20 2012 01/10/20 1955 01/10/20 2212 01/11/20 0441 01/11/20  0623 01/12/20 0622  WBC 5.2 3.9*  --  3.4*  --  1.8*  CRP  --  1.5*  --  1.6*  --  2.2*  DDIMER  --  0.51*  --   --  0.48 0.45  BNP  --   --   --   --  2,572.0* >4,500.0*  PROCALCITON  --   --  0.50  --   --   --   LATICACIDVEN  --  1.2 0.9  --   --   --   AST 26 35  --  35  --  36  ALT 16 19  --  16  --  19  ALKPHOS 78 72  --  65  --  69  BILITOT 0.9 1.1  --  0.5  --  0.9  ALBUMIN 3.1* 3.0*  --  2.6*  --  2.7*  INR 1.4*  --   --   --   --   --        2.  ESRD.  On TTS schedule.  Nephrology on board.  3.  Dementia.  At risk for delirium.  Supportive care.  4.  CAD.  S/p 2 stents in the past.  Chest pain-free, to new combination of aspirin, statin, beta-blocker, Imdur and Eliquis.  5.  Hypertension.  Currently stable on combination of Norvasc, beta-blocker, Imdur.  Continue and monitor.  6.  Dyslipidemia.  On statin.  7.  DM type II.  On combination of Levemir and sliding scale will monitor and adjust.  Lab Results  Component Value Date   HGBA1C 8.1 (H) 01/10/2020   CBG (last 3)  Recent Labs    01/11/20 0855 01/11/20 1509 01/11/20 1653  GLUCAP 147* 196* 244*      Condition -   Guarded  Family Communication  :  Daughter Judson Roch [phone]  01/11/20 - 7:57 am, message left on 01/12/2020 at 12:30 PM.  Code Status :  Full  Consults  :  Renal  Procedures  :  None  PUD Prophylaxis : PPI  Disposition Plan  :    Status is: Inpatient  Remains inpatient appropriate because:IV treatments appropriate due to intensity of illness or inability to take PO   Dispo: The patient  is from: Home              Anticipated d/c is to: SNF              Anticipated d/c date is: 3 days              Patient currently is not medically stable to d/c.   DVT Prophylaxis  :  Eliquis  Lab Results  Component Value Date   PLT 90 (L) 01/12/2020    Diet :  Diet Order            Diet renal/carb modified with fluid restriction Diet-HS Snack? Nothing; Fluid restriction: 1200 mL Fluid; Room service appropriate? Yes; Fluid consistency: Thin  Diet effective now                  Inpatient Medications  Scheduled Meds:  amLODipine  5 mg Oral Daily   apixaban  2.5 mg Oral BID   vitamin C  500 mg Oral Daily   aspirin  81 mg Oral Daily   atorvastatin  20 mg Oral QHS   calcitRIOL  2.5 mcg Oral Q T,Th,Sa-HD   Chlorhexidine Gluconate Cloth  6 each Topical Q0600   darbepoetin (  ARANESP) injection - DIALYSIS  40 mcg Intravenous Q Thu-HD   dexamethasone (DECADRON) injection  6 mg Intravenous Q24H   insulin aspart  0-5 Units Subcutaneous QHS   insulin aspart  0-9 Units Subcutaneous TID WC   insulin detemir  20 Units Subcutaneous BID   ipratropium  2 puff Inhalation Q6H   isosorbide mononitrate  60 mg Oral Daily   melatonin  10 mg Oral QHS   metoprolol tartrate  75 mg Oral BID   multivitamin  1 tablet Oral QHS   pantoprazole  20 mg Oral BID   zinc sulfate  220 mg Oral Daily   Continuous Infusions:  remdesivir 100 mg in NS 100 mL Stopped (01/11/20 1510)   PRN Meds:.acetaminophen, albuterol, chlorpheniramine-HYDROcodone, guaiFENesin-dextromethorphan, loperamide, senna-docusate  Antibiotics  :    Anti-infectives (From admission, onward)   Start     Dose/Rate Route Frequency Ordered Stop   01/11/20 1000  remdesivir 100 mg in sodium chloride 0.9 % 100 mL IVPB       "Followed by" Linked Group Details   100 mg 200 mL/hr over 30 Minutes Intravenous Every 24 hours 01/10/20 2129 01/15/20 0959   01/11/20 1000  remdesivir 100 mg in sodium chloride 0.9 % 100 mL IVPB   Status:  Discontinued       "Followed by" Linked Group Details   100 mg 200 mL/hr over 30 Minutes Intravenous Daily 01/10/20 2321 01/10/20 2339   01/10/20 2321  remdesivir 200 mg in sodium chloride 0.9% 250 mL IVPB  Status:  Discontinued       "Followed by" Linked Group Details   200 mg 580 mL/hr over 30 Minutes Intravenous Once 01/10/20 2321 01/10/20 2339   01/10/20 2200  remdesivir 200 mg in sodium chloride 0.9% 250 mL IVPB       "Followed by" Linked Group Details   200 mg 580 mL/hr over 30 Minutes Intravenous Once 01/10/20 2129 01/11/20 0135       Time Spent in minutes  30   Lala Lund M.D on 01/12/2020 at 12:27 PM  To page go to [email or web address] - password [facility]  [facility] -  Office  [phone]     See all Orders from today for further details    Objective:   Vitals:   01/11/20 2308 01/11/20 2325 01/12/20 0044 01/12/20 0648  BP: (!) 130/56 127/90 132/61 138/66  Pulse: 65 72 67 60  Resp: $Remo'18 18 18 18  'TmPOr$ Temp: 98.5 F (36.9 C) 98 F (36.7 C) 98 F (36.7 C) 98 F (36.7 C)  TempSrc: Oral Oral Oral Oral  SpO2: 95% 94% 97% 99%  Weight:      Height:        Wt Readings from Last 3 Encounters:  01/11/20 74 kg  01/03/20 69.9 kg  11/23/19 69.9 kg    No intake or output data in the 24 hours ending 01/12/20 1227   Physical Exam  Awake, mildly confused, No new F.N deficits,  [place].AT,PERRAL Supple Neck,No JVD, No cervical lymphadenopathy appriciated.  Symmetrical Chest wall movement, Good air movement bilaterally, CTAB RRR,No Gallops, Rubs or new Murmurs, No Parasternal Heave +ve B.Sounds, Abd Soft, No tenderness, No organomegaly appriciated, No rebound - guarding or rigidity. No Cyanosis, Clubbing or edema, No new Rash or bruise     Data Review:    CBC Recent Labs  Lab 01/08/20 2012 01/08/20 2021 01/10/20 1955 01/11/20 0441 01/12/20 0622  WBC 5.2  --  3.9* 3.4* 1.8*  HGB 9.7* 10.5* 8.6* 8.9*  9.4*  HCT 32.4* 31.0* 28.3* 29.3* 30.8*  PLT 129*   --  99* 87* 90*  MCV 108.4*  --  104.8* 105.4* 104.1*  MCH 32.4  --  31.9 32.0 31.8  MCHC 29.9*  --  30.4 30.4 30.5  RDW 19.1*  --  18.6* 18.2* 17.9*  LYMPHSABS 0.6*  --  0.5* 0.4* 0.4*  MONOABS 0.8  --  0.5 0.2 0.1  EOSABS 0.0  --  0.0 0.0 0.0  BASOSABS 0.0  --  0.0 0.0 0.0    Chemistries  Recent Labs  Lab 01/08/20 2012 01/08/20 2021 01/10/20 1605 01/10/20 1955 01/11/20 0441 01/11/20 0817 01/12/20 0622  NA 136 135  --  134* 134*  --  136  K 4.1 4.1  --  3.8 3.7  --  4.3  CL 94* 95*  --  94* 97*  --  96*  CO2 29  --   --  24 23  --  26  GLUCOSE 138* 131*  --  77 146*  --  164*  BUN 16 17  --  36* 38*  --  29*  CREATININE 6.98* 6.60*  --  10.54* 11.09*  --  7.11*  CALCIUM 9.1  --   --  8.6* 8.3*  --  9.2  AST 26  --   --  35 35  --  36  ALT 16  --   --  19 16  --  19  ALKPHOS 78  --   --  72 65  --  69  BILITOT 0.9  --   --  1.1 0.5  --  0.9  MG  --   --   --   --   --  2.4 2.2  INR 1.4*  --   --   --   --   --   --   HGBA1C  --   --  8.1*  --   --   --   --      ------------------------------------------------------------------------------------------------------------------ Recent Labs    01/10/20 1955  TRIG 107    Lab Results  Component Value Date   HGBA1C 8.1 (H) 01/10/2020   ------------------------------------------------------------------------------------------------------------------ No results for input(s): TSH, T4TOTAL, T3FREE, THYROIDAB in the last 72 hours.  Invalid input(s): FREET3  Cardiac Enzymes No results for input(s): CKMB, TROPONINI, MYOGLOBIN in the last 168 hours.  Invalid input(s): CK ------------------------------------------------------------------------------------------------------------------    Component Value Date/Time   BNP >4,500.0 (H) 01/12/2020 6720    Micro Results Recent Results (from the past 240 hour(s))  SARS Coronavirus 2 by RT PCR (hospital order, performed in [facility] hospital lab) Nasopharyngeal  Nasopharyngeal Swab     Status: None   Collection Time: 01/03/20  2:11 PM   Specimen: Nasopharyngeal Swab  Result Value Ref Range Status   SARS Coronavirus 2 NEGATIVE NEGATIVE Final    Comment: (NOTE) SARS-CoV-2 target nucleic acids are NOT DETECTED.  The SARS-CoV-2 RNA is generally detectable in upper and lower respiratory specimens during the acute phase of infection. The lowest concentration of SARS-CoV-2 viral copies this assay can detect is 250 copies / mL. A negative result does not preclude SARS-CoV-2 infection and should not be used as the sole basis for treatment or other patient management decisions.  A negative result may occur with improper specimen collection / handling, submission of specimen other than nasopharyngeal swab, presence of viral mutation(s) within the areas targeted by this assay, and inadequate number of viral copies (<250 copies / mL).  A negative result must be combined with clinical observations, patient history, and epidemiological information.  Fact Sheet for Patients:   StrictlyIdeas.no  Fact Sheet for Healthcare Providers: BankingDealers.co.za  This test is not yet approved or  cleared by the [place] FDA and has been authorized for detection and/or diagnosis of SARS-CoV-2 by FDA under an Emergency Use Authorization (EUA).  This EUA will remain in effect (meaning this test can be used) for the duration of the COVID-19 declaration under Section 564(b)(1) of the Act, 21 U.S.C. section 360bbb-3(b)(1), unless the authorization is terminated or revoked sooner.  Performed at [facility], [place] [address], [place], [place] [address]   Blood Culture (routine x 2)     Status: None (Preliminary result)   Collection Time: 01/10/20  4:05 PM   Specimen: BLOOD RIGHT FOREARM  Result Value Ref Range Status   Specimen Description BLOOD RIGHT FOREARM  Final   Special Requests   Final    BOTTLES DRAWN  AEROBIC AND ANAEROBIC Blood Culture adequate volume   Culture   Final    NO GROWTH 2 DAYS Performed at [facility], [place] [address] [place], [place] [address]    Report Status PENDING  Incomplete  Blood Culture (routine x 2)     Status: None (Preliminary result)   Collection Time: 01/10/20 10:12 PM   Specimen: BLOOD RIGHT HAND  Result Value Ref Range Status   Specimen Description BLOOD RIGHT HAND  Final   Special Requests   Final    BOTTLES DRAWN AEROBIC AND ANAEROBIC Blood Culture results may not be optimal due to an inadequate volume of blood received in culture bottles   Culture   Final    NO GROWTH 2 DAYS Performed at [facility], [place] [address], [place], [place] [address]    Report Status PENDING  Incomplete    Radiology Reports CT HEAD WO CONTRAST  Result Date: 01/08/2020 CLINICAL DATA:  Mental status change EXAM: CT HEAD WITHOUT CONTRAST TECHNIQUE: Contiguous axial images were obtained from the base of the skull through the vertex without intravenous contrast. COMPARISON:  CT head dated 09/30/2019 FINDINGS: Brain: No evidence of acute infarction, hemorrhage, hydrocephalus, extra-axial collection or mass lesion/mass effect. There is mild cerebral volume loss with associated ex vacuo dilatation. Periventricular white matter hypoattenuation likely represents chronic small vessel ischemic disease. Vascular: There are vascular calcifications in the carotid siphons. Skull: Normal. Negative for fracture or focal lesion. Sinuses/Orbits: No acute finding. Other: None. IMPRESSION: No acute intracranial process. Electronically Signed   By: Zerita Boers M.D.   On: 01/08/2020 20:42   CT ANKLE LEFT WO CONTRAST  Result Date: 12/21/2019 CLINICAL DATA:  Medial left ankle pain and swelling for 3 months. History of prior subtalar and tibiotalar fusion. EXAM: CT OF THE LEFT ANKLE WITHOUT CONTRAST TECHNIQUE: Multidetector CT imaging of the left ankle was performed according to the  standard protocol. Multiplanar CT image reconstructions were also generated. COMPARISON:  Plain films of the left foot 07/16/2016 and plain films left ankle 11/18/2019. FINDINGS: Bones/Joint/Cartilage The patient has a retrograde nail in place for subtalar and tibiotalar fusion. There is extensive lucency about the nail throughout its length. Marked lucency is also present about a screw in the distal tibia. Two screws in the calcaneus are also in place without surrounding lucency. The hardware is intact. There is mild lateral angulation of the nail. No bridging bone is seen across the tibiotalar or subtalar joints. The nail has mild  lateral angulation of approximately 11 degrees at the level of the metaphysis of the tibia. There is lateral hindfoot angulation of 38 degrees. Two screws are in place in the medial malleolus for fracture fixation. The fracture is healed. The distal fibula has been resected. Bridging bone between the distal 1.5 cm of the fibula and tibia is identified. Ligaments Suboptimally assessed by CT. Muscles and Tendons No tendon entrapment.  Tendons appear intact. Soft tissues There is some subcutaneous edema present. No focal fluid collection or mass is identified. IMPRESSION: Status post subtalar and tibiotalar fusion. There is no bridging bone across either joint. Extensive lucency about the intramedullary nail and screw in the distal tibia is consistent with motion and loosening. Hindfoot valgus. Healed medial malleolar fracture with 2 fixation screws in place. Electronically Signed   By: Inge Rise M.D.   On: 12/21/2019 10:37   PERIPHERAL VASCULAR CATHETERIZATION  Result Date: 01/03/2020 Patient name: JOSIEPHINE SIMAO MRN: [id number] DOB: Nov 14, 1950 Sex: female 01/03/2020 Pre-operative Diagnosis: Bleeding from left upper arm dialysis graft Post-operative diagnosis:  Same Surgeon:  Annamarie Major Procedure Performed:  1.  Ultrasound guided access, left upper arm dialysis graft  2.   Shuntogram  3.  Balloon venoplasty, left axillary vein (peripheral)  4.  Conscious sedation, 25 minutes  Indications: The patient came to the ER today for the second time in 3 days for bleeding from her dialysis graft.  I have recommended shuntogram to evaluate for central stenosis. Procedure:  The patient was identified in the holding area and taken to room 8.  The patient was then placed supine on the table and prepped and draped in the usual sterile fashion.  A time out was called.  Conscious sedation was administered with the use of IV fentanyl and Versed under continuous physician and nurse monitoring.  Heart rate, blood pressure, and oxygen saturations were continuously monitored.  Total sedation time was 25 minutes ultrasound was used to evaluate the fistula.  The vein was patent and compressible.  A digital ultrasound image was acquired.  The fistula was then accessed under ultrasound guidance using a micropuncture needle.  An 018 wire was then asvanced without resistance and a micropuncture sheath was placed.  Contrast injections were then performed through the sheath. Findings: The central venous system is widely patent.  A stent is visualized at the venous outflow tract.  Just beyond the stent there is a 80% stenosis within the axillary vein.  The arterial venous anastomosis is widely patent.  Intervention: After the above images were acquired the decision was made to proceed with intervention.  A 7 French sheath was inserted over a Bentson wire.  I used a Glidewire to cross the lesion.  I then proceeded with balloon venoplasty using an 8 x 40 Mustang balloon.  Follow-up imaging revealed improved but suboptimal result and so I upsized to a 12 x 40 Mustang balloon.  This was taken to 20 atm.  Follow-up imaging revealed complete resolution of the stenosis.  The access site was closed with a 4-0 Monocryl. Impression:  #1  Greater than 80% stenosis just beyond the venous outflow stent treated using an 8 mm  followed by 12 mm balloon with residual stenosis less than 10%.  Theotis Burrow, M.D., FACS [facility] Office: [phone] Pager:  [phone]  DG Chest Port 1 View  Result Date: 01/10/2020 CLINICAL DATA:  Hypoxia, shortness of breath, COVID-19 positive EXAM: PORTABLE CHEST 1 VIEW COMPARISON:  09/30/2019 FINDINGS: Single  frontal view of the chest demonstrates persistent enlargement of the cardiac silhouette. There is chronic interstitial prominence and mild central vascular congestion. No focal airspace disease, effusion, or pneumothorax. No acute bony abnormalities. IMPRESSION: 1. Chronic central vascular congestion and interstitial prominence. No acute airspace disease. Electronically Signed   By: [name] M.D.   On: 01/10/2020 16:10

## 2020-01-12 NOTE — ED Notes (Signed)
[id number] 

## 2020-01-12 NOTE — ED Notes (Signed)
CBG 233

## 2020-01-12 NOTE — Progress Notes (Signed)
Patient's OP HD clinic/[facility] is aware of patient's COVID positive status. She will be treated in the OP HD clinic on a TTS 3rd isolation shift at [facility] at discharge.  Navigator will monitor for discharge planning and keep clinic updated on patient's start in this shift.   Alphonzo Cruise, [place] [facility] Navigator [phone]

## 2020-01-12 NOTE — Progress Notes (Addendum)
[facility] Progress Note    Assessment/ Plan:   1. AHRF 2/2 acute COVID-19 pneumonitis: steroids, remdesivir per primary service 2. ESRD on HD TTS. S/p hd yesterday and today to keep back on schedule 3. Anemia of chronic disease. Avoid iron, on aranesp here 38mcg qweekly first dose 8/19 4. HTN/volume: resume home anti-htns. Will uf as tolerated 5. DM2, per primary 6. CAD s/p stents x 2. On eliquis, asa, statin, BB, imdur Additional recommendations: - Dose all meds for creatinine clearance <10 ml/min  - Unless absolutely necessary, no MRIs with gadolinium.  - Implement save arm precautions. Prefer needle sticks in the dorsum of the hands or wrists. No blood pressure measurements in arm. - If blood transfusion is requested during hemodialysis sessions, please alert Robin [name] prior to the session.    Outpatient Orders TTS/[place] 3.5hrs, F180, BFR 400/autoflow 1.5, edw 74.5kg, 2k, 2cal, 137na, 35bicarb, uf profile #1 No heparin venofer 100mg  qtreatment mircera 73mcg q4weeks   Robin Quint, MD [facility] 01/12/2020, 9:56 AM    Subjective:   S/p hd yesterday, no issues. Due for hd today. Episodes of diarrhea since yesterday.   Objective:   BP 138/66 (BP Location: Right Arm)   Pulse 60   Temp 98 F (36.7 C) (Oral)   Resp 18   Ht 5\' 5"  (1.651 m)   Wt 74 kg   SpO2 99%   BMI 27.15 kg/m  No intake or output data in the 24 hours ending 01/12/20 0956 Weight change: 4.1 kg  LIMITED EXAM IN THE SETTING OF THE COVID-19 PANDEMIC AND TO CONSERVE PPE Physical Exam: Gen:nad, sitting up at the edge of the bed CVS: reg rate Resp:normal wob, bl chest expansion JYN:[id number] Ext:lue avf Neuro: noncommunicative   Imaging: DG Chest Port 1 View  Result Date: 01/10/2020 CLINICAL DATA:  Hypoxia, shortness of breath, COVID-19 positive EXAM: PORTABLE CHEST 1 VIEW COMPARISON:  09/30/2019 FINDINGS: Single frontal view of the chest demonstrates persistent  enlargement of the cardiac silhouette. There is chronic interstitial prominence and mild central vascular congestion. No focal airspace disease, effusion, or pneumothorax. No acute bony abnormalities. IMPRESSION: 1. Chronic central vascular congestion and interstitial prominence. No acute airspace disease. Electronically Signed   By: [name] M.D.   On: 01/10/2020 16:10    Labs: BMET Recent Labs  Lab 01/08/20 2012 01/08/20 2021 01/10/20 1955 01/11/20 0441 01/12/20 0622  NA 136 135 134* 134* 136  K 4.1 4.1 3.8 3.7 4.3  CL 94* 95* 94* 97* 96*  CO2 29  --  24 23 26   GLUCOSE 138* 131* 77 146* 164*  BUN 16 17 36* 38* 29*  CREATININE 6.98* 6.60* 10.54* 11.09* 7.11*  CALCIUM 9.1  --  8.6* 8.3* 9.2   CBC Recent Labs  Lab 01/08/20 2012 01/08/20 2012 01/08/20 2021 01/10/20 1955 01/11/20 0441 01/12/20 0622  WBC 5.2  --   --  3.9* 3.4* 1.8*  NEUTROABS 3.6  --   --  2.9 2.8 1.3*  HGB 9.7*   < > 10.5* 8.6* 8.9* 9.4*  HCT 32.4*   < > 31.0* 28.3* 29.3* 30.8*  MCV 108.4*  --   --  104.8* 105.4* 104.1*  PLT 129*  --   --  99* 87* 90*   < > = values in this interval not displayed.    Medications:    . amLODipine  5 mg Oral Daily  . apixaban  2.5 mg Oral BID  . vitamin C  500 mg Oral  Daily  . aspirin  81 mg Oral Daily  . atorvastatin  20 mg Oral QHS  . calcitRIOL  2.5 mcg Oral Q T,Th,Sa-HD  . Chlorhexidine Gluconate Cloth  6 each Topical Q0600  . darbepoetin (ARANESP) injection - DIALYSIS  40 mcg Intravenous Q Thu-HD  . dexamethasone (DECADRON) injection  6 mg Intravenous Q24H  . insulin aspart  0-5 Units Subcutaneous QHS  . insulin aspart  0-9 Units Subcutaneous TID WC  . insulin detemir  20 Units Subcutaneous BID  . ipratropium  2 puff Inhalation Q6H  . isosorbide mononitrate  60 mg Oral Daily  . melatonin  10 mg Oral QHS  . metoprolol tartrate  75 mg Oral BID  . multivitamin  1 tablet Oral QHS  . pantoprazole  20 mg Oral BID  . zinc sulfate  220 mg Oral Daily

## 2020-01-12 NOTE — ED Notes (Signed)
Ordered [place] °

## 2020-01-13 LAB — COMPREHENSIVE METABOLIC PANEL
ALT: 21 U/L (ref 0–44)
AST: 50 U/L — ABNORMAL HIGH (ref 15–41)
Albumin: 2.7 g/dL — ABNORMAL LOW (ref 3.5–5.0)
Alkaline Phosphatase: 85 U/L (ref 38–126)
Anion gap: 12 (ref 5–15)
BUN: 19 mg/dL (ref 8–23)
CO2: 27 mmol/L (ref 22–32)
Calcium: 9.1 mg/dL (ref 8.9–10.3)
Chloride: 98 mmol/L (ref 98–111)
Creatinine, Ser: 3.76 mg/dL — ABNORMAL HIGH (ref 0.44–1.00)
GFR calc Af Amer: 14 mL/min — ABNORMAL LOW (ref >60)
GFR calc non Af Amer: 12 mL/min — ABNORMAL LOW (ref >60)
Glucose, Bld: 148 mg/dL — ABNORMAL HIGH (ref 70–99)
Potassium: 4.4 mmol/L (ref 3.5–5.1)
Sodium: 137 mmol/L (ref 135–145)
Total Bilirubin: 0.9 mg/dL (ref 0.3–1.2)
Total Protein: 5.6 g/dL — ABNORMAL LOW (ref 6.5–8.1)

## 2020-01-13 LAB — CBC WITH DIFFERENTIAL/PLATELET
Abs Immature Granulocytes: 0.01 10*3/uL (ref 0.00–0.07)
Basophils Absolute: 0 10*3/uL (ref 0.0–0.1)
Basophils Relative: 0 %
Eosinophils Absolute: 0 10*3/uL (ref 0.0–0.5)
Eosinophils Relative: 0 %
HCT: 31.5 % — ABNORMAL LOW (ref 36.0–46.0)
Hemoglobin: 10.1 g/dL — ABNORMAL LOW (ref 12.0–15.0)
Immature Granulocytes: 0 %
Lymphocytes Relative: 13 %
Lymphs Abs: 0.5 10*3/uL — ABNORMAL LOW (ref 0.7–4.0)
MCH: 32 pg (ref 26.0–34.0)
MCHC: 32.1 g/dL (ref 30.0–36.0)
MCV: 99.7 fL (ref 80.0–100.0)
Monocytes Absolute: 0.3 10*3/uL (ref 0.1–1.0)
Monocytes Relative: 9 %
Neutro Abs: 2.8 10*3/uL (ref 1.7–7.7)
Neutrophils Relative %: 78 %
Platelets: 107 10*3/uL — ABNORMAL LOW (ref 150–400)
RBC: 3.16 MIL/uL — ABNORMAL LOW (ref 3.87–5.11)
RDW: 17.1 % — ABNORMAL HIGH (ref 11.5–15.5)
WBC: 3.6 10*3/uL — ABNORMAL LOW (ref 4.0–10.5)
nRBC: 0 % (ref 0.0–0.2)

## 2020-01-13 LAB — MAGNESIUM: Magnesium: 2.1 mg/dL (ref 1.7–2.4)

## 2020-01-13 LAB — GLUCOSE, CAPILLARY
Glucose-Capillary: 120 mg/dL — ABNORMAL HIGH (ref 70–99)
Glucose-Capillary: 89 mg/dL (ref 70–99)
Glucose-Capillary: 139 mg/dL — ABNORMAL HIGH (ref 70–99)
Glucose-Capillary: 129 mg/dL — ABNORMAL HIGH (ref 70–99)

## 2020-01-13 LAB — D-DIMER, QUANTITATIVE: D-Dimer, Quant: 0.46 ug/mL-FEU (ref 0.00–0.50)

## 2020-01-13 LAB — C-REACTIVE PROTEIN: CRP: 2 mg/dL — ABNORMAL HIGH (ref <1.0)

## 2020-01-13 LAB — BRAIN NATRIURETIC PEPTIDE: B Natriuretic Peptide: 3802.4 pg/mL — ABNORMAL HIGH (ref 0.0–100.0)

## 2020-01-13 MED ORDER — DARBEPOETIN ALFA 40 MCG/0.4ML IJ SOSY
40.0000 ug | PREFILLED_SYRINGE | INTRAMUSCULAR | Status: DC
  Filled 2020-01-13: qty 0.4

## 2020-01-13 MED ORDER — DARBEPOETIN ALFA 40 MCG/0.4ML IJ SOSY
40.0000 ug | PREFILLED_SYRINGE | Freq: Once | INTRAMUSCULAR | Status: DC
  Filled 2020-01-13: qty 0.4

## 2020-01-13 NOTE — Evaluation (Signed)
Physical Therapy Evaluation Patient Details Name: Robin [name] MRN: [id number] DOB: 1950/07/11 Today's Date: 01/13/2020   History of Present Illness  69 yo female presenting via EMS with decreased responsiveeness and shortness of breath. Tested COVID positive. PMH including diabetes mellitus, hypertension, end-stage renal disease on dialysis Tuesday Thursday Saturday, bipolar disorder, hypertension, dementia  Clinical Impression  Pt is fatigued after having HD today.  She has dementia at baseline and dtr reports it usually gets worse in the hospital due to unfamiliar environment.  She is normally very functional at home mod I with the cane, takes scat to HD T, TH, Sat, and has a home Therapist, sports.  Pt's daughter does cooking, cleaning, and regular check ins to help keep her mom in her home, however, the daughter and her two daughters are sick with COVID and pt's daughter does not think she can care for herself and her mom at this time.  She is interested in SNF placement for therapy.   PT to follow acutely for deficits listed below.    Follow Up Recommendations SNF    Equipment Recommendations  None recommended by PT    Recommendations for Other Services       Precautions / Restrictions Precautions Precautions: Fall      Mobility  Bed Mobility Overal bed mobility: Needs Assistance Bed Mobility: Supine to Sit;Sit to Supine     Supine to sit: Min guard;HOB elevated Sit to supine: Min assist;HOB elevated;+2 for safety/equipment   General bed mobility comments: Min A for assistance with BLEs.  Extra time needed to process commands.   Transfers Overall transfer level: Needs assistance Equipment used: Rolling walker (2 wheeled) Transfers: Sit to/from Stand Sit to Stand: Min assist;+2 safety/equipment         General transfer comment: Light Min A for balance and to stabilize RW during transitions.   Ambulation/Gait Ambulation/Gait assistance: Min assist;+2 safety/equipment Gait Distance  (Feet): 8 Feet Assistive device: Rolling walker (2 wheeled) Gait Pattern/deviations: Step-through pattern;Shuffle     General Gait Details: Min assist to walk from bed to window, stood at window for a minute, had difficulty backing up, so turned RW to get back to the bed.  Pt needed physcial assist at trunk and to steer RW back to the bed.  Second person used, but ultimately note needed.    Stairs            Wheelchair Mobility    Modified Rankin (Stroke Patients Only)       Balance Overall balance assessment: Needs assistance Sitting-balance support: No upper extremity supported;Feet supported Sitting balance-Leahy Scale: Good Sitting balance - Comments: Able to don socks via figure 4 with legs with supervision, ran out of energy on the second sock and needed min assist to complete. Pt reports HD wears her out and she had HD earlier today.    Standing balance support: Bilateral upper extremity supported;During functional activity Standing balance-Leahy Scale: Poor Standing balance comment: Reliant on UE support on RW and light support from therapist in standing.                              Pertinent Vitals/Pain Pain Assessment: Faces Faces Pain Scale: No hurt Pain Intervention(s): Monitored during session    Home Living Family/patient expects to be discharged to:: Skilled nursing facility (per daughter request who is also sick) Living Arrangements: Alone Available Help at Discharge: Family;Available 24 hours/day (Daughter and grandchildren also have [place])  Type of Home: House Home Access: Stairs to enter Entrance Stairs-Rails: None Entrance Stairs-Number of Steps: 2 Home Layout: Two level;Able to live on main level with bedroom/bathroom Home Equipment: Shower seat;Walker - 2 wheels;Cane - single point Additional Comments: Family wanting rehab prior to return to home. SCAT to dialysis.     Prior Function Level of Independence: Independent with assistive  device(s)         Comments: Information from daughter via phone call. Uses cane for mobility. Performs BADLs. Family does IADLs. Has a home RN     Hand Dominance   Dominant Hand: Right    Extremity/Trunk Assessment   Upper Extremity Assessment Upper Extremity Assessment: Defer to OT evaluation    Lower Extremity Assessment Lower Extremity Assessment: Generalized weakness    Cervical / Trunk Assessment Cervical / Trunk Assessment: Kyphotic  Communication   Communication: HOH  Cognition Arousal/Alertness: Lethargic (Closing her eye throughout session) Behavior During Therapy: Flat affect Overall Cognitive Status: History of cognitive impairments - at baseline                                 General Comments: Baseline dementia. Requiring increased time and cues for following simple commands. Closing her eye during session and reports she feels very tired.  Daughter reports her dementia is worse outside of her familiar environment.       General Comments General comments (skin integrity, edema, etc.): Pt on RA throughout and believeable sats were all in the 90s with no signs of distress or DOE.     Exercises     Assessment/Plan    PT Assessment Patient needs continued PT services  PT Problem List Decreased strength;Decreased activity tolerance;Decreased balance;Decreased mobility;Decreased cognition;Decreased knowledge of use of DME       PT Treatment Interventions DME instruction;Stair training;Gait training;Therapeutic exercise;Functional mobility training;Therapeutic activities;Balance training;Neuromuscular re-education;Cognitive remediation;Patient/family education    PT Goals (Current goals can be found in the Care Plan section)  Acute Rehab PT Goals Patient Stated Goal: To rehab before return to home PT Goal Formulation: With family Time For Goal Achievement: 01/27/20 Potential to Achieve Goals: Good    Frequency Min 2X/week   Barriers to  discharge Decreased caregiver support daughter who is primary caregiver is sick    Co-evaluation PT/OT/SLP Co-Evaluation/Treatment: Yes Reason for Co-Treatment: For patient/therapist safety;Necessary to address cognition/behavior during functional activity;To address functional/ADL transfers PT goals addressed during session: Mobility/safety with mobility;Balance;Proper use of DME OT goals addressed during session: ADL's and self-care       AM-PAC PT "6 Clicks" Mobility  Outcome Measure Help needed turning from your back to your side while in a flat bed without using bedrails?: A Little Help needed moving from lying on your back to sitting on the side of a flat bed without using bedrails?: A Little Help needed moving to and from a bed to a chair (including a wheelchair)?: A Little Help needed standing up from a chair using your arms (e.g., wheelchair or bedside chair)?: A Little Help needed to walk in hospital room?: A Little Help needed climbing 3-5 steps with a railing? : A Little 6 Click Score: 18    End of Session   Activity Tolerance: Patient limited by fatigue Patient left: in bed;with call bell/phone within reach;with bed alarm set   PT Visit Diagnosis: Muscle weakness (generalized) (M62.81);Difficulty in walking, not elsewhere classified (R26.2)    Time: 6712-4580 PT Time Calculation (min) (  ACUTE ONLY): 22 min   Charges:   PT Evaluation $PT Eval Moderate Complexity: 1 Mod          Verdene Lennert, PT, DPT  Acute Rehabilitation [phone] pager [phone]) [phone] office

## 2020-01-13 NOTE — Progress Notes (Signed)
Subjective:  Missed Hd yest  Sec. to emergent cases , now on Hd , tolerating uf,co weakness but feeling better than admit,. Last OP HD 01/07/20    Objective Vital signs in last 24 hours: Vitals:   01/13/20 1030 01/13/20 1100 01/13/20 1130 01/13/20 1200  BP: 132/64 119/60 (!) 145/61 122/61  Pulse: (!) 55 (!) 55 (!) 53 60  Resp: 16 16 16 16   Temp:      TempSrc:      SpO2:      Weight:      Height:       Weight change:   Physical Exam: General: On Hd Alert chronically ill appearing female , NAD  Heart: RRR no m,r, g Lungs: CTA   Anteriorly, Non labored  Abdomen: Bs Pos ,soft, NT, ND  Extremities:  No pedal edema  Dialysis Access: LUA AVF Patent on hd     Outpatient Orders TTS/[facility] 3.5hrs, F180, BFR 400/autoflow 1.5, edw 74.5kg, 2k, 2cal, 137na, 35bicarb, uf profile #1 No heparin venofer 100mg  qtreatment mircera 78mcg q4weeks last given 12/01/19   Problem/Plan: 1. AHRF 2/2 acute COVID-19 pneumonitis: steroids, remdesivir per primary service 2. ESRD on HD TTS. schedule , missed hd yest 2/2 ER case, now on hd today  Will get back on schedule [facility]   3. Anemia of chronic disease. HGB 9.4 Avoid iron, on aranesp here 17mcg qweekly first dose 8/19 (OP Mircera q 4 weeks  At dc  Need q 2 weeks ) 4. HTN/volume: 122/61 this am and stable on hd  With current uf ,resume home anti-htns. Will uf as tolerated 5. DM2, per primary 6. CAD s/p stents x 2. On eliquis, asa, statin, BB, imdur Additional recommendations: - Dose all meds for creatinine clearance <10 ml/min  - Unless absolutely necessary, no MRIs with gadolinium.  - Implement save arm precautions. Prefer needle sticks in the dorsum of the hands or wrists. No blood pressure measurements in arm. - If blood transfusion is requested during hemodialysis sessions, please alert [place] prior to the session.   Ernest Haber, PA-C [facility] Beeper [phone] 01/13/2020,12:18 PM  LOS: 3 days   Labs: Basic Metabolic  Panel: Recent Labs  Lab 01/10/20 1955 01/11/20 0441 01/12/20 0622  NA 134* 134* 136  K 3.8 3.7 4.3  CL 94* 97* 96*  CO2 24 23 26   GLUCOSE 77 146* 164*  BUN 36* 38* 29*  CREATININE 10.54* 11.09* 7.11*  CALCIUM 8.6* 8.3* 9.2   Liver Function Tests: Recent Labs  Lab 01/10/20 1955 01/11/20 0441 01/12/20 0622  AST 35 35 36  ALT 19 16 19   ALKPHOS 72 65 69  BILITOT 1.1 0.5 0.9  PROT 5.4* 5.4* 5.6*  ALBUMIN 3.0* 2.6* 2.7*   No results for input(s): LIPASE, AMYLASE in the last 168 hours. No results for input(s): AMMONIA in the last 168 hours. CBC: Recent Labs  Lab 01/08/20 2012 01/08/20 2021 01/10/20 1955 01/11/20 0441 01/12/20 0622  WBC 5.2   < > 3.9* 3.4* 1.8*  NEUTROABS 3.6   < > 2.9 2.8 1.3*  HGB 9.7*   < > 8.6* 8.9* 9.4*  HCT 32.4*   < > 28.3* 29.3* 30.8*  MCV 108.4*  --  104.8* 105.4* 104.1*  PLT 129*   < > 99* 87* 90*   < > = values in this interval not displayed.   Cardiac Enzymes: No results for input(s): CKTOTAL, CKMB, CKMBINDEX, TROPONINI in the last 168 hours. CBG: Recent Labs  Lab 01/12/20 [phone]  01/12/20 1134 01/12/20 1632 01/12/20 2057 01/13/20 0756  GLUCAP 165* 233* 278* 176* 120*    Studies/Results: No results found. Medications: . remdesivir 100 mg in NS 100 mL Stopped (01/12/20 1431)   . amLODipine  5 mg Oral Daily  . apixaban  2.5 mg Oral BID  . vitamin C  500 mg Oral Daily  . aspirin  81 mg Oral Daily  . atorvastatin  20 mg Oral QHS  . calcitRIOL  2.5 mcg Oral Q T,Th,Sa-HD  . Chlorhexidine Gluconate Cloth  6 each Topical Q0600  . darbepoetin (ARANESP) injection - DIALYSIS  40 mcg Intravenous Q Thu-HD  . dexamethasone (DECADRON) injection  6 mg Intravenous Q24H  . insulin aspart  0-5 Units Subcutaneous QHS  . insulin aspart  0-9 Units Subcutaneous TID WC  . insulin detemir  20 Units Subcutaneous BID  . ipratropium  2 puff Inhalation Q6H  . isosorbide mononitrate  60 mg Oral Daily  . melatonin  10 mg Oral QHS  . metoprolol  tartrate  75 mg Oral BID  . multivitamin  1 tablet Oral QHS  . pantoprazole  20 mg Oral BID  . zinc sulfate  220 mg Oral Daily

## 2020-01-13 NOTE — Discharge Instructions (Signed)
Information on my medicine - ELIQUIS (apixaban)  This medication education was reviewed with me or my healthcare representative as part of my discharge preparation.  The pharmacist that spoke with me during my hospital stay was:  Onnie Boer, RPH-CPP  Why was Eliquis prescribed for you? Eliquis was prescribed to treat blood clots that may have been found in the veins of your legs (deep vein thrombosis) or in your lungs (pulmonary embolism) and to reduce the risk of them occurring again.  What do You need to know about Eliquis ? Continue to take Eliquis 2.5 mg (1/2) tablet taken TWICE daily.  Eliquis may be taken with or without food.   Try to take the dose about the same time in the morning and in the evening. If you have difficulty swallowing the tablet whole please discuss with your pharmacist how to take the medication safely.  Take Eliquis exactly as prescribed and DO NOT stop taking Eliquis without talking to the doctor who prescribed the medication.  Stopping may increase your risk of developing a new blood clot.  Refill your prescription before you run out.  After discharge, you should have regular check-up appointments with your healthcare provider that is prescribing your Eliquis.    What do you do if you miss a dose? If a dose of ELIQUIS is not taken at the scheduled time, take it as soon as possible on the same day and twice-daily administration should be resumed. The dose should not be doubled to make up for a missed dose.  Important Safety Information A possible side effect of Eliquis is bleeding. You should call your healthcare provider right away if you experience any of the following: ? Bleeding from an injury or your nose that does not stop. ? Unusual colored urine (red or dark brown) or unusual colored stools (red or black). ? Unusual bruising for unknown reasons. ? A serious fall or if you hit your head (even if there is no bleeding).  Some medicines may interact  with Eliquis and might increase your risk of bleeding or clotting while on Eliquis. To help avoid this, consult your healthcare provider or pharmacist prior to using any new prescription or non-prescription medications, including herbals, vitamins, non-steroidal anti-inflammatory drugs (NSAIDs) and supplements.  This website has more information on Eliquis (apixaban): http://[email or web address].com/eliquis/home

## 2020-01-13 NOTE — Progress Notes (Signed)
PROGRESS NOTE                                                                                                                                                                                                             Patient Demographics:    Robin [name], is a 69 y.o. female, DOB - [birth year], XBM:[id number]  Outpatient Primary MD for the patient is [facility] [place] [facility]  Admit date - 01/10/2020    CC - SOB     Brief Narrative  - Robin [name] is a 69 y.o. female with medical history significant for diabetes mellitus, hypertension, end-stage renal disease on dialysis Tuesday Thursday Saturday, bipolar disorder, hypertension, dementia presents by EMS with decreased responsiveness and shortness of breath, she was diagnosed with Covid infection at the [place] facility on 01/10/2020 but was sent home as she was doing well.  Patient is quite demented and much of the history was obtained by the admitting physician through family.  She was admitted for the treatment of COVID-19 pneumonia.   Subjective:   Patient in bed, appears comfortable, denies any headache, no fever, no chest pain or pressure, no shortness of breath , no abdominal pain. No focal weakness.    Assessment  & Plan :    1. Acute Hypoxic Resp. Failure due to Acute Covid 19 Viral Pneumonitis during the ongoing 2020 Covid 19 Pandemic - she is unvaccinated, so far appears to have mild to moderate disease, has been started on steroid and remdesivir combination which should be continued.  Encouraged the patient to sit up in chair in the daytime use I-S and flutter valve for pulmonary toiletry and then prone in bed when at night.  Will advance activity and titrate down oxygen as possible.   Recent Labs  Lab 01/08/20 2012 01/10/20 1955 01/10/20 2212 01/11/20 0441 01/11/20 0817 01/12/20 0622  WBC 5.2 3.9*  --  3.4*  --  1.8*  CRP  --  1.5*  --  1.6*  --  2.2*   DDIMER  --  0.51*  --   --  0.48 0.45  BNP  --   --   --   --  2,572.0* >4,500.0*  PROCALCITON  --   --  0.50  --   --   --   LATICACIDVEN  --  1.2 0.9  --   --   --   AST 26 35  --  35  --  36  ALT 16 19  --  16  --  19  ALKPHOS 78 72  --  65  --  69  BILITOT 0.9 1.1  --  0.5  --  0.9  ALBUMIN 3.1* 3.0*  --  2.6*  --  2.7*  INR 1.4*  --   --   --   --   --        2.  ESRD.  On TTS schedule.  Nephrology on board.  3.  Dementia.  At risk for delirium.  Supportive care.  4.  CAD.  S/p 2 stents in the past.  Chest pain-free, to new combination of aspirin, statin, beta-blocker, Imdur and Eliquis.  5.  Hypertension.  Currently stable on combination of Norvasc, beta-blocker, Imdur.  Continue and monitor.  6.  Dyslipidemia.  On statin.  7.  DM type II.  On combination of Levemir and sliding scale will monitor and adjust.  Lab Results  Component Value Date   HGBA1C 8.1 (H) 01/10/2020   CBG (last 3)  Recent Labs    01/12/20 1632 01/12/20 2057 01/13/20 0756  GLUCAP 278* 176* 120*      Condition -   Guarded  Family Communication  :  Daughter Judson Roch [phone]  01/11/20 - 7:57 am, message left on 01/12/2020 at 12:30 PM, 01/13/20    Code Status :  Full  Consults  :  Renal  Procedures  :  None  PUD Prophylaxis : PPI  Disposition Plan  :    Status is: Inpatient  Remains inpatient appropriate because:IV treatments appropriate due to intensity of illness or inability to take PO   Dispo: The patient is from: Home              Anticipated d/c is to: SNF              Anticipated d/c date is: 3 days              Patient currently is not medically stable to d/c.   DVT Prophylaxis  :  Eliquis  Lab Results  Component Value Date   PLT 90 (L) 01/12/2020    Diet :  Diet Order            Diet renal/carb modified with fluid restriction Diet-HS Snack? Nothing; Fluid restriction: 1200 mL Fluid; Room service appropriate? Yes; Fluid consistency: Thin  Diet effective now                   Inpatient Medications  Scheduled Meds: . amLODipine  5 mg Oral Daily  . apixaban  2.5 mg Oral BID  . vitamin C  500 mg Oral Daily  . aspirin  81 mg Oral Daily  . atorvastatin  20 mg Oral QHS  . calcitRIOL  2.5 mcg Oral Q T,Th,Sa-HD  . Chlorhexidine Gluconate Cloth  6 each Topical Q0600  . darbepoetin (ARANESP) injection - DIALYSIS  40 mcg Intravenous Q Thu-HD  . dexamethasone (DECADRON) injection  6 mg Intravenous Q24H  . insulin aspart  0-5 Units Subcutaneous QHS  . insulin aspart  0-9 Units Subcutaneous TID WC  . insulin detemir  20 Units Subcutaneous BID  . ipratropium  2 puff Inhalation Q6H  . isosorbide mononitrate  60 mg Oral Daily  . melatonin  10 mg Oral QHS  . metoprolol tartrate  75  mg Oral BID  . multivitamin  1 tablet Oral QHS  . pantoprazole  20 mg Oral BID  . zinc sulfate  220 mg Oral Daily   Continuous Infusions: . remdesivir 100 mg in NS 100 mL Stopped (01/12/20 1431)   PRN Meds:.acetaminophen, albuterol, chlorpheniramine-HYDROcodone, guaiFENesin-dextromethorphan, loperamide, senna-docusate  Antibiotics  :    Anti-infectives (From admission, onward)   Start     Dose/Rate Route Frequency Ordered Stop   01/11/20 1000  remdesivir 100 mg in sodium chloride 0.9 % 100 mL IVPB       "Followed by" Linked Group Details   100 mg 200 mL/hr over 30 Minutes Intravenous Every 24 hours 01/10/20 2129 01/15/20 0959   01/11/20 1000  remdesivir 100 mg in sodium chloride 0.9 % 100 mL IVPB  Status:  Discontinued       "Followed by" Linked Group Details   100 mg 200 mL/hr over 30 Minutes Intravenous Daily 01/10/20 2321 01/10/20 2339   01/10/20 2321  remdesivir 200 mg in sodium chloride 0.9% 250 mL IVPB  Status:  Discontinued       "Followed by" Linked Group Details   200 mg 580 mL/hr over 30 Minutes Intravenous Once 01/10/20 2321 01/10/20 2339   01/10/20 2200  remdesivir 200 mg in sodium chloride 0.9% 250 mL IVPB       "Followed by" Linked Group Details    200 mg 580 mL/hr over 30 Minutes Intravenous Once 01/10/20 2129 01/11/20 0135       Time Spent in minutes  30   Lala Lund M.D on 01/13/2020 at 10:30 AM  To page go to [email or web address] - password [facility]  [facility] -  Office  [phone]     See all Orders from today for further details    Objective:   Vitals:   01/13/20 0755 01/13/20 0920 01/13/20 0932 01/13/20 1000  BP: (!) 147/73 122/72 124/78 127/67  Pulse: (!) 55 (!) 54 (!) 52 (!) 52  Resp:  $Remo'16 16 16  'MBKjv$ Temp: 97.9 F (36.6 C) 98 F (36.7 C)    TempSrc: Oral Oral    SpO2:      Weight:      Height:        Wt Readings from Last 3 Encounters:  01/11/20 74 kg  01/03/20 69.9 kg  11/23/19 69.9 kg     Intake/Output Summary (Last 24 hours) at 01/13/2020 1030 Last data filed at 01/13/2020 0600 Gross per 24 hour  Intake 548.88 ml  Output --  Net 548.88 ml     Physical Exam  Awake with mild confusion, No new F.N deficits,   [place].AT,PERRAL Supple Neck,No JVD, No cervical lymphadenopathy appriciated.  Symmetrical Chest wall movement, Good air movement bilaterally, CTAB RRR,No Gallops, Rubs or new Murmurs, No Parasternal Heave +ve B.Sounds, Abd Soft, No tenderness, No organomegaly appriciated, No rebound - guarding or rigidity. No Cyanosis, Clubbing or edema, No new Rash or bruise    Data Review:    CBC Recent Labs  Lab 01/08/20 2012 01/08/20 2021 01/10/20 1955 01/11/20 0441 01/12/20 0622  WBC 5.2  --  3.9* 3.4* 1.8*  HGB 9.7* 10.5* 8.6* 8.9* 9.4*  HCT 32.4* 31.0* 28.3* 29.3* 30.8*  PLT 129*  --  99* 87* 90*  MCV 108.4*  --  104.8* 105.4* 104.1*  MCH 32.4  --  31.9 32.0 31.8  MCHC 29.9*  --  30.4 30.4 30.5  RDW 19.1*  --  18.6* 18.2* 17.9*  LYMPHSABS 0.6*  --  0.5*  0.4* 0.4*  MONOABS 0.8  --  0.5 0.2 0.1  EOSABS 0.0  --  0.0 0.0 0.0  BASOSABS 0.0  --  0.0 0.0 0.0    Chemistries  Recent Labs  Lab 01/08/20 2012 01/08/20 2021 01/10/20 1605 01/10/20 1955 01/11/20 0441 01/11/20 0817  01/12/20 0622  NA 136 135  --  134* 134*  --  136  K 4.1 4.1  --  3.8 3.7  --  4.3  CL 94* 95*  --  94* 97*  --  96*  CO2 29  --   --  24 23  --  26  GLUCOSE 138* 131*  --  77 146*  --  164*  BUN 16 17  --  36* 38*  --  29*  CREATININE 6.98* 6.60*  --  10.54* 11.09*  --  7.11*  CALCIUM 9.1  --   --  8.6* 8.3*  --  9.2  AST 26  --   --  35 35  --  36  ALT 16  --   --  19 16  --  19  ALKPHOS 78  --   --  72 65  --  69  BILITOT 0.9  --   --  1.1 0.5  --  0.9  MG  --   --   --   --   --  2.4 2.2  INR 1.4*  --   --   --   --   --   --   HGBA1C  --   --  8.1*  --   --   --   --      ------------------------------------------------------------------------------------------------------------------ Recent Labs    01/10/20 1955  TRIG 107    Lab Results  Component Value Date   HGBA1C 8.1 (H) 01/10/2020   ------------------------------------------------------------------------------------------------------------------ No results for input(s): TSH, T4TOTAL, T3FREE, THYROIDAB in the last 72 hours.  Invalid input(s): FREET3  Cardiac Enzymes No results for input(s): CKMB, TROPONINI, MYOGLOBIN in the last 168 hours.  Invalid input(s): CK ------------------------------------------------------------------------------------------------------------------    Component Value Date/Time   BNP >4,500.0 (H) 01/12/2020 0981    Micro Results Recent Results (from the past 240 hour(s))  SARS Coronavirus 2 by RT PCR (hospital order, performed in [facility] hospital lab) Nasopharyngeal Nasopharyngeal Swab     Status: None   Collection Time: 01/03/20  2:11 PM   Specimen: Nasopharyngeal Swab  Result Value Ref Range Status   SARS Coronavirus 2 NEGATIVE NEGATIVE Final    Comment: (NOTE) SARS-CoV-2 target nucleic acids are NOT DETECTED.  The SARS-CoV-2 RNA is generally detectable in upper and lower respiratory specimens during the acute phase of infection. The lowest concentration of SARS-CoV-2  viral copies this assay can detect is 250 copies / mL. A negative result does not preclude SARS-CoV-2 infection and should not be used as the sole basis for treatment or other patient management decisions.  A negative result may occur with improper specimen collection / handling, submission of specimen other than nasopharyngeal swab, presence of viral mutation(s) within the areas targeted by this assay, and inadequate number of viral copies (<250 copies / mL). A negative result must be combined with clinical observations, patient history, and epidemiological information.  Fact Sheet for Patients:   StrictlyIdeas.no  Fact Sheet for Healthcare Providers: BankingDealers.co.za  This test is not yet approved or  cleared by the [place] FDA and has been authorized for detection and/or diagnosis of SARS-CoV-2 by FDA under an Emergency Use  Authorization (EUA).  This EUA will remain in effect (meaning this test can be used) for the duration of the COVID-19 declaration under Section 564(b)(1) of the Act, 21 U.S.C. section 360bbb-3(b)(1), unless the authorization is terminated or revoked sooner.  Performed at [facility], [place] [address] [place], [place] [address]   Blood Culture (routine x 2)     Status: None (Preliminary result)   Collection Time: 01/10/20  4:05 PM   Specimen: BLOOD RIGHT FOREARM  Result Value Ref Range Status   Specimen Description BLOOD RIGHT FOREARM  Final   Special Requests   Final    BOTTLES DRAWN AEROBIC AND ANAEROBIC Blood Culture adequate volume   Culture   Final    NO GROWTH 3 DAYS Performed at [facility], [place] [address], [place], [place] [address]    Report Status PENDING  Incomplete  Blood Culture (routine x 2)     Status: None (Preliminary result)   Collection Time: 01/10/20 10:12 PM   Specimen: BLOOD RIGHT HAND  Result Value Ref Range Status   Specimen Description BLOOD RIGHT HAND  Final    Special Requests   Final    BOTTLES DRAWN AEROBIC AND ANAEROBIC Blood Culture results may not be optimal due to an inadequate volume of blood received in culture bottles   Culture   Final    NO GROWTH 3 DAYS Performed at [facility], [place] [address] [place], [place] [address]    Report Status PENDING  Incomplete    Radiology Reports CT HEAD WO CONTRAST  Result Date: 01/08/2020 CLINICAL DATA:  Mental status change EXAM: CT HEAD WITHOUT CONTRAST TECHNIQUE: Contiguous axial images were obtained from the base of the skull through the vertex without intravenous contrast. COMPARISON:  CT head dated 09/30/2019 FINDINGS: Brain: No evidence of acute infarction, hemorrhage, hydrocephalus, extra-axial collection or mass lesion/mass effect. There is mild cerebral volume loss with associated ex vacuo dilatation. Periventricular white matter hypoattenuation likely represents chronic small vessel ischemic disease. Vascular: There are vascular calcifications in the carotid siphons. Skull: Normal. Negative for fracture or focal lesion. Sinuses/Orbits: No acute finding. Other: None. IMPRESSION: No acute intracranial process. Electronically Signed   By: Zerita Boers M.D.   On: 01/08/2020 20:42   CT ANKLE LEFT WO CONTRAST  Result Date: 12/21/2019 CLINICAL DATA:  Medial left ankle pain and swelling for 3 months. History of prior subtalar and tibiotalar fusion. EXAM: CT OF THE LEFT ANKLE WITHOUT CONTRAST TECHNIQUE: Multidetector CT imaging of the left ankle was performed according to the standard protocol. Multiplanar CT image reconstructions were also generated. COMPARISON:  Plain films of the left foot 07/16/2016 and plain films left ankle 11/18/2019. FINDINGS: Bones/Joint/Cartilage The patient has a retrograde nail in place for subtalar and tibiotalar fusion. There is extensive lucency about the nail throughout its length. Marked lucency is also present about a screw in the distal tibia. Two screws in the  calcaneus are also in place without surrounding lucency. The hardware is intact. There is mild lateral angulation of the nail. No bridging bone is seen across the tibiotalar or subtalar joints. The nail has mild lateral angulation of approximately 11 degrees at the level of the metaphysis of the tibia. There is lateral hindfoot angulation of 38 degrees. Two screws are in place in the medial malleolus for fracture fixation. The fracture is healed. The distal fibula has been resected. Bridging bone between the distal 1.5 cm of the fibula and tibia is identified. Ligaments  Suboptimally assessed by CT. Muscles and Tendons No tendon entrapment.  Tendons appear intact. Soft tissues There is some subcutaneous edema present. No focal fluid collection or mass is identified. IMPRESSION: Status post subtalar and tibiotalar fusion. There is no bridging bone across either joint. Extensive lucency about the intramedullary nail and screw in the distal tibia is consistent with motion and loosening. Hindfoot valgus. Healed medial malleolar fracture with 2 fixation screws in place. Electronically Signed   By: Drusilla Kanner M.D.   On: 12/21/2019 10:37   PERIPHERAL VASCULAR CATHETERIZATION  Result Date: 01/03/2020 Patient name: LINNA THEBEAU MRN: [id number] DOB: 05-05-1951 Sex: female 01/03/2020 Pre-operative Diagnosis: Bleeding from left upper arm dialysis graft Post-operative diagnosis:  Same Surgeon:  Durene Cal Procedure Performed:  1.  Ultrasound guided access, left upper arm dialysis graft  2.  Shuntogram  3.  Balloon venoplasty, left axillary vein (peripheral)  4.  Conscious sedation, 25 minutes  Indications: The patient came to the ER today for the second time in 3 days for bleeding from her dialysis graft.  I have recommended shuntogram to evaluate for central stenosis. Procedure:  The patient was identified in the holding area and taken to room 8.  The patient was then placed supine on the table and prepped and draped  in the usual sterile fashion.  A time out was called.  Conscious sedation was administered with the use of IV fentanyl and Versed under continuous physician and nurse monitoring.  Heart rate, blood pressure, and oxygen saturations were continuously monitored.  Total sedation time was 25 minutes ultrasound was used to evaluate the fistula.  The vein was patent and compressible.  A digital ultrasound image was acquired.  The fistula was then accessed under ultrasound guidance using a micropuncture needle.  An 018 wire was then asvanced without resistance and a micropuncture sheath was placed.  Contrast injections were then performed through the sheath. Findings: The central venous system is widely patent.  A stent is visualized at the venous outflow tract.  Just beyond the stent there is a 80% stenosis within the axillary vein.  The arterial venous anastomosis is widely patent.  Intervention: After the above images were acquired the decision was made to proceed with intervention.  A 7 French sheath was inserted over a Bentson wire.  I used a Glidewire to cross the lesion.  I then proceeded with balloon venoplasty using an 8 x 40 Mustang balloon.  Follow-up imaging revealed improved but suboptimal result and so I upsized to a 12 x 40 Mustang balloon.  This was taken to 20 atm.  Follow-up imaging revealed complete resolution of the stenosis.  The access site was closed with a 4-0 Monocryl. Impression:  #1  Greater than 80% stenosis just beyond the venous outflow stent treated using an 8 mm followed by 12 mm balloon with residual stenosis less than 10%.  Juleen China, M.D., FACS [facility] Office: [phone] Pager:  [phone]  DG Chest Port 1 View  Result Date: 01/10/2020 CLINICAL DATA:  Hypoxia, shortness of breath, COVID-19 positive EXAM: PORTABLE CHEST 1 VIEW COMPARISON:  09/30/2019 FINDINGS: Single frontal view of the chest demonstrates persistent enlargement of the  cardiac silhouette. There is chronic interstitial prominence and mild central vascular congestion. No focal airspace disease, effusion, or pneumothorax. No acute bony abnormalities. IMPRESSION: 1. Chronic central vascular congestion and interstitial prominence. No acute airspace disease. Electronically Signed   By: Sharlet Salina M.D.   On: 01/10/2020 16:10

## 2020-01-13 NOTE — Evaluation (Signed)
Occupational Therapy Evaluation Patient Details Name: Robin [name] MRN: [id number] DOB: [birth year] Today's Date: 01/13/2020    History of Present Illness 69 yo female presenting via EMS with decreased responsiveeness and shortness of breath. Tested COVID positive. PMH including diabetes mellitus, hypertension, end-stage renal disease on dialysis Tuesday Thursday Saturday, bipolar disorder, hypertension, dementia   Clinical Impression   PTA, pt was living alone and performing BADLs; family checking in regularly and performing IADLs. Home info and PLOF collected from daughter via phone who reports that pt's support also has COVID and is recovering. Pt currently requiring Min A for UB ADLs and functional mobility. Pt presenting with decreased balance, strength, activity tolerance, and cognition. Pt with difficulty staying wake during session and closing her eye frequently. SpO2 90s on RA. Pt would benefit from further acute OT to facilitate safe dc. Recommend dc to SNF for further OT to optimize safety, independence with ADLs, and return to PLOF.     Follow Up Recommendations  SNF;Supervision/Assistance - 24 hour    Equipment Recommendations  Other (comment) (Defer to next venue)    Recommendations for Other Services PT consult     Precautions / Restrictions Precautions Precautions: Fall      Mobility Bed Mobility Overal bed mobility: Needs Assistance Bed Mobility: Supine to Sit;Sit to Supine     Supine to sit: Min guard;HOB elevated Sit to supine: Min assist;HOB elevated;+2 for safety/equipment   General bed mobility comments: Min A for assistance with BLEs  Transfers Overall transfer level: Needs assistance Equipment used: Rolling walker (2 wheeled) Transfers: Sit to/from Stand Sit to Stand: Min assist         General transfer comment: Light Min A for balance    Balance Overall balance assessment: Needs assistance Sitting-balance support: No upper extremity  supported;Feet supported Sitting balance-Leahy Scale: Good Sitting balance - Comments: Able to don socks   Standing balance support: Bilateral upper extremity supported;During functional activity Standing balance-Leahy Scale: Poor Standing balance comment: Reliant on UE support                           ADL either performed or assessed with clinical judgement   ADL Overall ADL's : Needs assistance/impaired Eating/Feeding: Set up;Supervision/ safety;Sitting;Bed level   Grooming: Supervision/safety;Set up;Sitting   Upper Body Bathing: Supervision/ safety;Set up;Sitting   Lower Body Bathing: Minimal assistance;Sit to/from stand   Upper Body Dressing : Supervision/safety;Set up;Sitting   Lower Body Dressing: Minimal assistance Lower Body Dressing Details (indicate cue type and reason): Use figure four method at EOB to don socks. Requiring Min A to finish task and for balance during standing Toilet Transfer: Minimal assistance;Ambulation;RW           Functional mobility during ADLs: Minimal assistance;Rolling walker General ADL Comments: Pt presenting with decreased balance, strength, and cognition.      Vision         Perception     Praxis      Pertinent Vitals/Pain Pain Assessment: Faces Faces Pain Scale: No hurt Pain Intervention(s): Monitored during session     Hand Dominance Right   Extremity/Trunk Assessment Upper Extremity Assessment Upper Extremity Assessment: Generalized weakness   Lower Extremity Assessment Lower Extremity Assessment: Defer to PT evaluation   Cervical / Trunk Assessment Cervical / Trunk Assessment: Kyphotic   Communication Communication Communication: HOH   Cognition Arousal/Alertness: Awake/alert;Lethargic (Closing her eye throughout session) Behavior During Therapy: Flat affect Overall Cognitive Status: History of cognitive impairments -  at baseline                                 General Comments:  Baseline dementia. Requiring increased time and cues for following simple commands. Closing her eye during session and reports she feels very tired   General Comments  SpO2 90s on RA with good pleth. HR 60s. RR 20s.  BP  136/64    Exercises     Shoulder Instructions      Home Living Family/patient expects to be discharged to:: Skilled nursing facility Living Arrangements: Alone Available Help at Discharge: Family;Available 24 hours/day (Daughter and grandchildren also have COVID) Type of Home: House Home Access: Stairs to enter [employer] of Steps: 2 Entrance Stairs-Rails: None Home Layout: Two level;Able to live on main level with bedroom/bathroom Alternate Level Stairs-Number of Steps: full flight but doesnt go upstairs   Bathroom Shower/Tub: Teacher, early years/pre: Standard     Home Equipment: Clinical cytogeneticist - 2 wheels;Cane - single point   Additional Comments: Family wanting rehab prior to return to home. SCAT to dialysis.       Prior Functioning/Environment Level of Independence: Independent with assistive device(s)        Comments: Information from daughter via phone call. Uses cane for mobility. Performs BADLs. Family does IADLs. Has a home RN        OT Problem List: Decreased strength;Decreased range of motion;Decreased activity tolerance;Impaired balance (sitting and/or standing);Decreased knowledge of use of DME or AE;Decreased knowledge of precautions;Decreased cognition;Decreased safety awareness      OT Treatment/Interventions: Self-care/ADL training;Therapeutic exercise;Energy conservation;DME and/or AE instruction;Therapeutic activities;Patient/family education    OT Goals(Current goals can be found in the care plan section) Acute Rehab OT Goals Patient Stated Goal: To rehab before return to home OT Goal Formulation: With family Time For Goal Achievement: 01/27/20 Potential to Achieve Goals: Good  OT Frequency: Min 2X/week    Barriers to D/C: Other (comment) (Family sick with COVID; usually very supportive)          Co-evaluation PT/OT/SLP Co-Evaluation/Treatment: Yes Reason for Co-Treatment: For patient/therapist safety;To address functional/ADL transfers   OT goals addressed during session: ADL's and self-care      AM-PAC OT "6 Clicks" Daily Activity     Outcome Measure Help from another person eating meals?: A Little Help from another person taking care of personal grooming?: A Little Help from another person toileting, which includes using toliet, bedpan, or urinal?: A Little Help from another person bathing (including washing, rinsing, drying)?: A Little Help from another person to put on and taking off regular upper body clothing?: A Little Help from another person to put on and taking off regular lower body clothing?: A Little 6 Click Score: 18   End of Session Equipment Utilized During Treatment: Rolling walker Nurse Communication: Mobility status  Activity Tolerance: Patient tolerated treatment well;Patient limited by lethargy Patient left: in bed;with call bell/phone within reach;with bed alarm set  OT Visit Diagnosis: Unsteadiness on feet (R26.81);Other abnormalities of gait and mobility (R26.89);Muscle weakness (generalized) (M62.81);History of falling (Z91.81)                Time: 2878-6767 OT Time Calculation (min): 23 min Charges:  OT General Charges $OT Visit: 1 Visit OT Evaluation $OT Eval Moderate Complexity: [place], OTR/L Acute Rehab Pager: [phone] Office: [place] 01/13/2020, 4:32 PM

## 2020-01-13 NOTE — Progress Notes (Signed)
RE: Robin [name]. [name] Date of Birth:  June 01, 2050 Date: 01/13/20  Please be advised that the above-named patient will require a short-term nursing home stay - anticipated 30 days or less for rehabilitation and strengthening.  The plan is for return home.

## 2020-01-14 LAB — CBC WITH DIFFERENTIAL/PLATELET
Abs Immature Granulocytes: 0.02 10*3/uL (ref 0.00–0.07)
Basophils Absolute: 0 10*3/uL (ref 0.0–0.1)
Basophils Relative: 0 %
Eosinophils Absolute: 0 10*3/uL (ref 0.0–0.5)
Eosinophils Relative: 0 %
HCT: 33 % — ABNORMAL LOW (ref 36.0–46.0)
Hemoglobin: 10.4 g/dL — ABNORMAL LOW (ref 12.0–15.0)
Immature Granulocytes: 0 %
Lymphocytes Relative: 10 %
Lymphs Abs: 0.5 10*3/uL — ABNORMAL LOW (ref 0.7–4.0)
MCH: 31.6 pg (ref 26.0–34.0)
MCHC: 31.5 g/dL (ref 30.0–36.0)
MCV: 100.3 fL — ABNORMAL HIGH (ref 80.0–100.0)
Monocytes Absolute: 0.3 10*3/uL (ref 0.1–1.0)
Monocytes Relative: 5 %
Neutro Abs: 4 10*3/uL (ref 1.7–7.7)
Neutrophils Relative %: 85 %
Platelets: 144 10*3/uL — ABNORMAL LOW (ref 150–400)
RBC: 3.29 MIL/uL — ABNORMAL LOW (ref 3.87–5.11)
RDW: 17.2 % — ABNORMAL HIGH (ref 11.5–15.5)
WBC: 4.7 10*3/uL (ref 4.0–10.5)
nRBC: 0 % (ref 0.0–0.2)

## 2020-01-14 LAB — COMPREHENSIVE METABOLIC PANEL
ALT: 22 U/L (ref 0–44)
AST: 35 U/L (ref 15–41)
Albumin: 2.9 g/dL — ABNORMAL LOW (ref 3.5–5.0)
Alkaline Phosphatase: 76 U/L (ref 38–126)
Anion gap: 16 — ABNORMAL HIGH (ref 5–15)
BUN: 33 mg/dL — ABNORMAL HIGH (ref 8–23)
CO2: 27 mmol/L (ref 22–32)
Calcium: 9.4 mg/dL (ref 8.9–10.3)
Chloride: 98 mmol/L (ref 98–111)
Creatinine, Ser: 5.24 mg/dL — ABNORMAL HIGH (ref 0.44–1.00)
GFR calc Af Amer: 9 mL/min — ABNORMAL LOW (ref >60)
GFR calc non Af Amer: 8 mL/min — ABNORMAL LOW (ref >60)
Glucose, Bld: 62 mg/dL — ABNORMAL LOW (ref 70–99)
Potassium: 4.1 mmol/L (ref 3.5–5.1)
Sodium: 141 mmol/L (ref 135–145)
Total Bilirubin: 1 mg/dL (ref 0.3–1.2)
Total Protein: 5.9 g/dL — ABNORMAL LOW (ref 6.5–8.1)

## 2020-01-14 LAB — GLUCOSE, CAPILLARY
Glucose-Capillary: 70 mg/dL (ref 70–99)
Glucose-Capillary: 119 mg/dL — ABNORMAL HIGH (ref 70–99)
Glucose-Capillary: 66 mg/dL — ABNORMAL LOW (ref 70–99)
Glucose-Capillary: 172 mg/dL — ABNORMAL HIGH (ref 70–99)
Glucose-Capillary: 61 mg/dL — ABNORMAL LOW (ref 70–99)
Glucose-Capillary: 72 mg/dL (ref 70–99)

## 2020-01-14 LAB — C-REACTIVE PROTEIN: CRP: 1.1 mg/dL — ABNORMAL HIGH (ref <1.0)

## 2020-01-14 LAB — MAGNESIUM: Magnesium: 2.1 mg/dL (ref 1.7–2.4)

## 2020-01-14 LAB — BRAIN NATRIURETIC PEPTIDE: B Natriuretic Peptide: >4500 pg/mL — ABNORMAL HIGH (ref 0.0–100.0)

## 2020-01-14 LAB — D-DIMER, QUANTITATIVE: D-Dimer, Quant: 0.33 ug/mL-FEU (ref 0.00–0.50)

## 2020-01-14 MED ORDER — CALCITRIOL 0.5 MCG PO CAPS
ORAL_CAPSULE | ORAL | Status: AC
  Administered 2020-01-14: 2.5 ug via ORAL
  Filled 2020-01-14: qty 5

## 2020-01-14 MED ORDER — DARBEPOETIN ALFA 40 MCG/0.4ML IJ SOSY
PREFILLED_SYRINGE | INTRAMUSCULAR | Status: AC
  Filled 2020-01-14: qty 0.4

## 2020-01-14 MED ORDER — METOPROLOL TARTRATE 100 MG PO TABS
100.0000 mg | ORAL_TABLET | Freq: Two times a day (BID) | ORAL | Status: DC
  Administered 2020-01-14 – 2020-01-21 (×13): 100 mg via ORAL
  Filled 2020-01-14 (×15): qty 1

## 2020-01-14 NOTE — Progress Notes (Signed)
Subjective:  Hd later today, no acute events. Not communicative this am but awake.  Objective Vital signs in last 24 hours: Vitals:   01/13/20 1429 01/13/20 1635 01/13/20 1949 01/13/20 2217  BP: 139/60  131/73 (!) 143/75  Pulse: 60  64 66  Resp:   16   Temp: 97.7 F (36.5 C) 97.9 F (36.6 C) 98.2 F (36.8 C)   TempSrc: Oral Oral Oral   SpO2:   94%   Weight:      Height:       Weight change:   Physical Exam: General: Alert chronically ill appearing female , NAD  Heart: reg rate Lungs: normal wob, unlabored, bl chest expansion Abdomen: soft, NT, ND  Extremities:  No pedal edema  Dialysis Access: LUA AVF    Labs: Basic Metabolic Panel: Recent Labs  Lab 01/12/20 0622 01/13/20 1633 01/14/20 0827  NA 136 137 141  K 4.3 4.4 4.1  CL 96* 98 98  CO2 26 27 27   GLUCOSE 164* 148* 62*  BUN 29* 19 33*  CREATININE 7.11* 3.76* 5.24*  CALCIUM 9.2 9.1 9.4   Liver Function Tests: Recent Labs  Lab 01/12/20 0622 01/13/20 1633 01/14/20 0827  AST 36 50* 35  ALT 19 21 22   ALKPHOS 69 85 76  BILITOT 0.9 0.9 1.0  PROT 5.6* 5.6* 5.9*  ALBUMIN 2.7* 2.7* 2.9*   No results for input(s): LIPASE, AMYLASE in the last 168 hours. No results for input(s): AMMONIA in the last 168 hours. CBC: Recent Labs  Lab 01/10/20 1955 01/10/20 1955 01/11/20 0441 01/11/20 0441 01/12/20 0622 01/13/20 1633 01/14/20 0827  WBC 3.9*   < > 3.4*   < > 1.8* 3.6* 4.7  NEUTROABS 2.9   < > 2.8   < > 1.3* 2.8 4.0  HGB 8.6*   < > 8.9*   < > 9.4* 10.1* 10.4*  HCT 28.3*   < > 29.3*   < > 30.8* 31.5* 33.0*  MCV 104.8*  --  105.4*  --  104.1* 99.7 100.3*  PLT 99*   < > 87*   < > 90* 107* 144*   < > = values in this interval not displayed.   Cardiac Enzymes: No results for input(s): CKTOTAL, CKMB, CKMBINDEX, TROPONINI in the last 168 hours. CBG: Recent Labs  Lab 01/13/20 0756 01/13/20 1426 01/13/20 1634 01/13/20 2027 01/14/20 0744  GLUCAP 120* 89 139* 129* 70    Studies/Results: No results  found. Medications: . remdesivir 100 mg in NS 100 mL 100 mg (01/13/20 1459)   . amLODipine  5 mg Oral Daily  . apixaban  2.5 mg Oral BID  . vitamin C  500 mg Oral Daily  . aspirin  81 mg Oral Daily  . atorvastatin  20 mg Oral QHS  . calcitRIOL  2.5 mcg Oral Q T,Th,Sa-HD  . Chlorhexidine Gluconate Cloth  6 each Topical Q0600  . darbepoetin (ARANESP) injection - DIALYSIS  40 mcg Intravenous Q Sat-HD  . dexamethasone (DECADRON) injection  6 mg Intravenous Q24H  . insulin aspart  0-5 Units Subcutaneous QHS  . insulin aspart  0-9 Units Subcutaneous TID WC  . insulin detemir  20 Units Subcutaneous BID  . ipratropium  2 puff Inhalation Q6H  . isosorbide mononitrate  60 mg Oral Daily  . melatonin  10 mg Oral QHS  . metoprolol tartrate  75 mg Oral BID  . multivitamin  1 tablet Oral QHS  . pantoprazole  20 mg Oral BID  .  zinc sulfate  220 mg Oral Daily    Outpatient Orders TTS/[place] 3.5hrs, F180, BFR 400/autoflow 1.5, edw 74.5kg, 2k, 2cal, 137na, 35bicarb, uf profile #1 No heparin venofer 100mg  qtreatment mircera 62mcg q4weeks last given 12/01/19   Problem/Plan: 1. AHRF 2/2 acute COVID-19 pneumonitis: steroids, remdesivir per primary service 2. ESRD on HD TTS. schedule , hd later today, shortened treatment time due to high patient census 3. Anemia of chronic disease. HGB 10.4 Avoid iron, on aranesp here 35mcg qweekly first dose 8/19 (OP Mircera q 4 weeks  At dc  Need q 2 weeks ) 4. HTN/volume: bp stable, Will uf as tolerated 5. DM2, per primary 6. CAD s/p stents x 2. On eliquis, asa, statin, BB, imdur Additional recommendations: - Dose all meds for creatinine clearance <10 ml/min  - Unless absolutely necessary, no MRIs with gadolinium.  - Implement save arm precautions. Prefer needle sticks in the dorsum of the hands or wrists. No blood pressure measurements in arm. - If blood transfusion is requested during hemodialysis sessions, please alert [place] prior to the session.    Gean Quint, MD [facility]

## 2020-01-14 NOTE — Progress Notes (Addendum)
PROGRESS NOTE                                                                                                                                                                                                             Patient Demographics:    Robin [name], is a 69 y.o. female, DOB - 12-12-1950, HUD:[id number]  [facility]  Admit date - 01/10/2020    CC - SOB     Brief Narrative  - Robin [name] is a 69 y.o. female with medical history significant for diabetes mellitus, hypertension, end-stage renal disease on dialysis Tuesday Thursday Saturday, bipolar disorder, hypertension, dementia presents by EMS with decreased responsiveness and shortness of breath, she was diagnosed with Covid infection at the [place] facility on 01/10/2020 but was sent home as she was doing well.  Patient is quite demented and much of the history was obtained by the admitting physician through family.  She was admitted for the treatment of COVID-19 pneumonia.   Subjective:   Patient in bed, appears comfortable, denies any headache, no fever, no chest pain or pressure, no shortness of breath , no abdominal pain. No focal weakness.   Assessment  & Plan :    1. Acute Hypoxic Resp. Failure due to Acute Covid 19 Viral Pneumonitis during the ongoing 2020 Covid 19 Pandemic - she is unvaccinated, so far appears to have mild to moderate disease, has been started on steroid and remdesivir combination which should be continued.  Encouraged the patient to sit up in chair in the daytime use I-S and flutter valve for pulmonary toiletry and then prone in bed when at night.  Will advance activity and titrate down oxygen as possible.   Recent Labs  Lab 01/08/20 2012 01/08/20 2012 01/10/20 1955 01/10/20 2212 01/11/20 0441 01/11/20 0817 01/12/20 0622 01/13/20 1633 01/14/20 0827  WBC 5.2   < > 3.9*  --   3.4*  --  1.8* 3.6* 4.7  CRP  --   --  1.5*  --  1.6*  --  2.2* 2.0* 1.1*  DDIMER  --   --  0.51*  --   --  0.48 0.45 0.46 0.33  BNP  --   --   --   --   --  2,572.0* >4,500.0* 3,802.4*  --   PROCALCITON  --   --   --  0.50  --   --   --   --   --   LATICACIDVEN  --   --  1.2 0.9  --   --   --   --   --   AST 26   < > 35  --  35  --  36 50* 35  ALT 16   < > 19  --  16  --  $R'19 21 22  'SN$ ALKPHOS 78   < > 72  --  65  --  69 85 76  BILITOT 0.9   < > 1.1  --  0.5  --  0.9 0.9 1.0  ALBUMIN 3.1*   < > 3.0*  --  2.6*  --  2.7* 2.7* 2.9*  INR 1.4*  --   --   --   --   --   --   --   --    < > = values in this interval not displayed.       2.  ESRD.  On TTS schedule.  Nephrology on board.  3.  Dementia.  At risk for delirium.  Supportive care.  4.  CAD.  S/p 2 stents in the past.  Chest pain-free, to new combination of aspirin, statin, beta-blocker, Imdur and Eliquis.  5.  Hypertension.  Currently stable on combination of Norvasc, beta-blocker, Imdur.  Continue and monitor.  6.  Dyslipidemia.  On statin.  7.  Asymptomatic 9 beat NSVT vs Artifact on 8/21 - EF 55%, B Blocker increased, Mag 2.1  8. DM type II.  On combination of Levemir and sliding scale will monitor and adjust.  Lab Results  Component Value Date   HGBA1C 8.1 (H) 01/10/2020   CBG (last 3)  Recent Labs    01/13/20 1634 01/13/20 2027 01/14/20 0744  GLUCAP 139* 129* 70      Condition -   Guarded  Family Communication  :  Daughter Judson Roch [phone]  01/11/20 - 7:57 am, message left on 01/12/2020 at 12:30 PM, 01/13/20    Code Status :  Full  Consults  :  Renal  Procedures  :  None  PUD Prophylaxis : PPI  Disposition Plan  :    Status is: Inpatient  Remains inpatient appropriate because:IV treatments appropriate due to intensity of illness or inability to take PO   Dispo: The patient is from: Home              Anticipated d/c is to: SNF              Anticipated d/c date is: 3 days              Patient  currently is not medically stable to d/c.   DVT Prophylaxis  :  Eliquis  Lab Results  Component Value Date   PLT 144 (L) 01/14/2020    Diet :  Diet Order            Diet renal/carb modified with fluid restriction Diet-HS Snack? Nothing; Fluid restriction: 1200 mL Fluid; Room service appropriate? Yes; Fluid consistency: Thin  Diet effective now                  Inpatient Medications  Scheduled Meds: . amLODipine  5 mg Oral Daily  . apixaban  2.5 mg Oral BID  . vitamin C  500 mg Oral Daily  .  aspirin  81 mg Oral Daily  . atorvastatin  20 mg Oral QHS  . calcitRIOL  2.5 mcg Oral Q T,Th,Sa-HD  . Chlorhexidine Gluconate Cloth  6 each Topical Q0600  . darbepoetin (ARANESP) injection - DIALYSIS  40 mcg Intravenous Q Sat-HD  . dexamethasone (DECADRON) injection  6 mg Intravenous Q24H  . insulin aspart  0-5 Units Subcutaneous QHS  . insulin aspart  0-9 Units Subcutaneous TID WC  . insulin detemir  20 Units Subcutaneous BID  . ipratropium  2 puff Inhalation Q6H  . isosorbide mononitrate  60 mg Oral Daily  . melatonin  10 mg Oral QHS  . metoprolol tartrate  75 mg Oral BID  . multivitamin  1 tablet Oral QHS  . pantoprazole  20 mg Oral BID  . zinc sulfate  220 mg Oral Daily   Continuous Infusions: . remdesivir 100 mg in NS 100 mL 100 mg (01/13/20 1459)   PRN Meds:.acetaminophen, albuterol, chlorpheniramine-HYDROcodone, guaiFENesin-dextromethorphan, loperamide, senna-docusate  Antibiotics  :    Anti-infectives (From admission, onward)   Start     Dose/Rate Route Frequency Ordered Stop   01/11/20 1000  remdesivir 100 mg in sodium chloride 0.9 % 100 mL IVPB       "Followed by" Linked Group Details   100 mg 200 mL/hr over 30 Minutes Intravenous Every 24 hours 01/10/20 2129 01/15/20 0959   01/11/20 1000  remdesivir 100 mg in sodium chloride 0.9 % 100 mL IVPB  Status:  Discontinued       "Followed by" Linked Group Details   100 mg 200 mL/hr over 30 Minutes Intravenous Daily  01/10/20 2321 01/10/20 2339   01/10/20 2321  remdesivir 200 mg in sodium chloride 0.9% 250 mL IVPB  Status:  Discontinued       "Followed by" Linked Group Details   200 mg 580 mL/hr over 30 Minutes Intravenous Once 01/10/20 2321 01/10/20 2339   01/10/20 2200  remdesivir 200 mg in sodium chloride 0.9% 250 mL IVPB       "Followed by" Linked Group Details   200 mg 580 mL/hr over 30 Minutes Intravenous Once 01/10/20 2129 01/11/20 0135       Time Spent in minutes  30   Susa Raring M.D on 01/14/2020 at 9:32 AM  To page go to [email or web address] - password [facility]  [facility] -  Office  [phone]     See all Orders from today for further details    Objective:   Vitals:   01/13/20 1429 01/13/20 1635 01/13/20 1949 01/13/20 2217  BP: 139/60  131/73 (!) 143/75  Pulse: 60  64 66  Resp:   16   Temp: 97.7 F (36.5 C) 97.9 F (36.6 C) 98.2 F (36.8 C)   TempSrc: Oral Oral Oral   SpO2:   94%   Weight:      Height:        Wt Readings from Last 3 Encounters:  01/13/20 73 kg  01/03/20 69.9 kg  11/23/19 69.9 kg     Intake/Output Summary (Last 24 hours) at 01/14/2020 0932 Last data filed at 01/14/2020 0631 Gross per 24 hour  Intake 589.12 ml  Output 1300 ml  Net -710.88 ml     Physical Exam  Awake, mildly confirmed, No new F.N deficits,   [place].AT,PERRAL Supple Neck,No JVD, No cervical lymphadenopathy appriciated.  Symmetrical Chest wall movement, Good air movement bilaterally, CTAB RRR,No Gallops, Rubs or new Murmurs, No Parasternal Heave +ve B.Sounds, Abd Soft, No tenderness, No organomegaly  appriciated, No rebound - guarding or rigidity. No Cyanosis, Clubbing or edema, No new Rash or bruise    Data Review:    CBC Recent Labs  Lab 01/10/20 1955 01/11/20 0441 01/12/20 0622 01/13/20 1633 01/14/20 0827  WBC 3.9* 3.4* 1.8* 3.6* 4.7  HGB 8.6* 8.9* 9.4* 10.1* 10.4*  HCT 28.3* 29.3* 30.8* 31.5* 33.0*  PLT 99* 87* 90* 107* 144*  MCV 104.8* 105.4* 104.1* 99.7  100.3*  MCH 31.9 32.0 31.8 32.0 31.6  MCHC 30.4 30.4 30.5 32.1 31.5  RDW 18.6* 18.2* 17.9* 17.1* 17.2*  LYMPHSABS 0.5* 0.4* 0.4* 0.5* 0.5*  MONOABS 0.5 0.2 0.1 0.3 0.3  EOSABS 0.0 0.0 0.0 0.0 0.0  BASOSABS 0.0 0.0 0.0 0.0 0.0    Chemistries  Recent Labs  Lab 01/08/20 2012 01/08/20 2021 01/10/20 1605 01/10/20 1955 01/11/20 0441 01/11/20 0817 01/12/20 0622 01/13/20 1633 01/14/20 0827  NA 136   < >  --  134* 134*  --  136 137 141  K 4.1   < >  --  3.8 3.7  --  4.3 4.4 4.1  CL 94*   < >  --  94* 97*  --  96* 98 98  CO2 29   < >  --  24 23  --  $R'26 27 27  'jD$ GLUCOSE 138*   < >  --  77 146*  --  164* 148* 62*  BUN 16   < >  --  36* 38*  --  29* 19 33*  CREATININE 6.98*   < >  --  10.54* 11.09*  --  7.11* 3.76* 5.24*  CALCIUM 9.1   < >  --  8.6* 8.3*  --  9.2 9.1 9.4  AST 26   < >  --  35 35  --  36 50* 35  ALT 16   < >  --  19 16  --  $R'19 21 22  'DZ$ ALKPHOS 78   < >  --  72 65  --  69 85 76  BILITOT 0.9   < >  --  1.1 0.5  --  0.9 0.9 1.0  MG  --   --   --   --   --  2.4 2.2 2.1 2.1  INR 1.4*  --   --   --   --   --   --   --   --   HGBA1C  --   --  8.1*  --   --   --   --   --   --    < > = values in this interval not displayed.     ------------------------------------------------------------------------------------------------------------------ No results for input(s): CHOL, HDL, LDLCALC, TRIG, CHOLHDL, LDLDIRECT in the last 72 hours.  Lab Results  Component Value Date   HGBA1C 8.1 (H) 01/10/2020   ------------------------------------------------------------------------------------------------------------------ No results for input(s): TSH, T4TOTAL, T3FREE, THYROIDAB in the last 72 hours.  Invalid input(s): FREET3  Cardiac Enzymes No results for input(s): CKMB, TROPONINI, MYOGLOBIN in the last 168 hours.  Invalid input(s): CK ------------------------------------------------------------------------------------------------------------------    Component Value Date/Time    BNP 3,802.4 (H) 01/13/2020 1633    Micro Results Recent Results (from the past 240 hour(s))  Blood Culture (routine x 2)     Status: None (Preliminary result)   Collection Time: 01/10/20  4:05 PM   Specimen: BLOOD RIGHT FOREARM  Result Value Ref Range Status   Specimen Description BLOOD RIGHT FOREARM  Final   Special Requests  Final    BOTTLES DRAWN AEROBIC AND ANAEROBIC Blood Culture adequate volume   Culture   Final    NO GROWTH 3 DAYS Performed at [facility], [place] [address] [place], [place] [address]    Report Status PENDING  Incomplete  Blood Culture (routine x 2)     Status: None (Preliminary result)   Collection Time: 01/10/20 10:12 PM   Specimen: BLOOD RIGHT HAND  Result Value Ref Range Status   Specimen Description BLOOD RIGHT HAND  Final   Special Requests   Final    BOTTLES DRAWN AEROBIC AND ANAEROBIC Blood Culture results may not be optimal due to an inadequate volume of blood received in culture bottles   Culture   Final    NO GROWTH 3 DAYS Performed at [facility], [place] [address], [place], [place] [address]    Report Status PENDING  Incomplete    Radiology Reports CT HEAD WO CONTRAST  Result Date: 01/08/2020 CLINICAL DATA:  Mental status change EXAM: CT HEAD WITHOUT CONTRAST TECHNIQUE: Contiguous axial images were obtained from the base of the skull through the vertex without intravenous contrast. COMPARISON:  CT head dated 09/30/2019 FINDINGS: Brain: No evidence of acute infarction, hemorrhage, hydrocephalus, extra-axial collection or mass lesion/mass effect. There is mild cerebral volume loss with associated ex vacuo dilatation. Periventricular white matter hypoattenuation likely represents chronic small vessel ischemic disease. Vascular: There are vascular calcifications in the carotid siphons. Skull: Normal. Negative for fracture or focal lesion. Sinuses/Orbits: No acute finding. Other: None. IMPRESSION: No acute intracranial process. Electronically  Signed   By: Zerita Boers M.D.   On: 01/08/2020 20:42   CT ANKLE LEFT WO CONTRAST  Result Date: 12/21/2019 CLINICAL DATA:  Medial left ankle pain and swelling for 3 months. History of prior subtalar and tibiotalar fusion. EXAM: CT OF THE LEFT ANKLE WITHOUT CONTRAST TECHNIQUE: Multidetector CT imaging of the left ankle was performed according to the standard protocol. Multiplanar CT image reconstructions were also generated. COMPARISON:  Plain films of the left foot 07/16/2016 and plain films left ankle 11/18/2019. FINDINGS: Bones/Joint/Cartilage The patient has a retrograde nail in place for subtalar and tibiotalar fusion. There is extensive lucency about the nail throughout its length. Marked lucency is also present about a screw in the distal tibia. Two screws in the calcaneus are also in place without surrounding lucency. The hardware is intact. There is mild lateral angulation of the nail. No bridging bone is seen across the tibiotalar or subtalar joints. The nail has mild lateral angulation of approximately 11 degrees at the level of the metaphysis of the tibia. There is lateral hindfoot angulation of 38 degrees. Two screws are in place in the medial malleolus for fracture fixation. The fracture is healed. The distal fibula has been resected. Bridging bone between the distal 1.5 cm of the fibula and tibia is identified. Ligaments Suboptimally assessed by CT. Muscles and Tendons No tendon entrapment.  Tendons appear intact. Soft tissues There is some subcutaneous edema present. No focal fluid collection or mass is identified. IMPRESSION: Status post subtalar and tibiotalar fusion. There is no bridging bone across either joint. Extensive lucency about the intramedullary nail and screw in the distal tibia is consistent with motion and loosening. Hindfoot valgus. Healed medial malleolar fracture with 2 fixation screws in place. Electronically Signed   By: Inge Rise M.D.   On: 12/21/2019 10:37    PERIPHERAL VASCULAR CATHETERIZATION  Result Date: 01/03/2020 Patient name: EVA GRIFFO MRN:  [id number] DOB: Jun 30, 1950 Sex: female 01/03/2020 Pre-operative Diagnosis: Bleeding from left upper arm dialysis graft Post-operative diagnosis:  Same Surgeon:  Annamarie Major Procedure Performed:  1.  Ultrasound guided access, left upper arm dialysis graft  2.  Shuntogram  3.  Balloon venoplasty, left axillary vein (peripheral)  4.  Conscious sedation, 25 minutes  Indications: The patient came to the ER today for the second time in 3 days for bleeding from her dialysis graft.  I have recommended shuntogram to evaluate for central stenosis. Procedure:  The patient was identified in the holding area and taken to room 8.  The patient was then placed supine on the table and prepped and draped in the usual sterile fashion.  A time out was called.  Conscious sedation was administered with the use of IV fentanyl and Versed under continuous physician and nurse monitoring.  Heart rate, blood pressure, and oxygen saturations were continuously monitored.  Total sedation time was 25 minutes ultrasound was used to evaluate the fistula.  The vein was patent and compressible.  A digital ultrasound image was acquired.  The fistula was then accessed under ultrasound guidance using a micropuncture needle.  An 018 wire was then asvanced without resistance and a micropuncture sheath was placed.  Contrast injections were then performed through the sheath. Findings: The central venous system is widely patent.  A stent is visualized at the venous outflow tract.  Just beyond the stent there is a 80% stenosis within the axillary vein.  The arterial venous anastomosis is widely patent.  Intervention: After the above images were acquired the decision was made to proceed with intervention.  A 7 French sheath was inserted over a Bentson wire.  I used a Glidewire to cross the lesion.  I then proceeded with balloon venoplasty using an 8 x 40 Mustang  balloon.  Follow-up imaging revealed improved but suboptimal result and so I upsized to a 12 x 40 Mustang balloon.  This was taken to 20 atm.  Follow-up imaging revealed complete resolution of the stenosis.  The access site was closed with a 4-0 Monocryl. Impression:  #1  Greater than 80% stenosis just beyond the venous outflow stent treated using an 8 mm followed by 12 mm balloon with residual stenosis less than 10%.  Theotis Burrow, M.D., FACS [facility] Office: [phone] Pager:  [phone]  DG Chest Port 1 View  Result Date: 01/10/2020 CLINICAL DATA:  Hypoxia, shortness of breath, COVID-19 positive EXAM: PORTABLE CHEST 1 VIEW COMPARISON:  09/30/2019 FINDINGS: Single frontal view of the chest demonstrates persistent enlargement of the cardiac silhouette. There is chronic interstitial prominence and mild central vascular congestion. No focal airspace disease, effusion, or pneumothorax. No acute bony abnormalities. IMPRESSION: 1. Chronic central vascular congestion and interstitial prominence. No acute airspace disease. Electronically Signed   By: [name] M.D.   On: 01/10/2020 16:10

## 2020-01-14 NOTE — Significant Event (Signed)
Rapid Response Event Note   Reason for Call :  Bleeding  Initial Focused Assessment:  Received a call from the secretary stating that the HD nurse was unable to stop her patient's bleeding from her AV fistula.  When I walked into the room the nurse was hold appropriate pressure on the patient.  There was some blood on her sheets.  She asked me to get her supplies to help stop the bleeding.  She also stated that the patient had not received any heparin and she was worried.  The patient was resting comfortably in bed during this situation.  BP 142/75 (91) Pulse 65 Resp 20 O2 100 room air    Interventions:  I grabbed the HD RN the needed supplies and stayed with her until the bleeding stopped I also helped the RN change her bed linen to clean sheets  Plan of Care:  Patient will return to her unit.    Event Summary:   MD Notified:  Call Time:  Alpha Time: 9150 End Time: [place], RN

## 2020-01-15 LAB — COMPREHENSIVE METABOLIC PANEL
ALT: 21 U/L (ref 0–44)
AST: 35 U/L (ref 15–41)
Albumin: 2.8 g/dL — ABNORMAL LOW (ref 3.5–5.0)
Alkaline Phosphatase: 74 U/L (ref 38–126)
Anion gap: 12 (ref 5–15)
BUN: 17 mg/dL (ref 8–23)
CO2: 28 mmol/L (ref 22–32)
Calcium: 8.6 mg/dL — ABNORMAL LOW (ref 8.9–10.3)
Chloride: 96 mmol/L — ABNORMAL LOW (ref 98–111)
Creatinine, Ser: 3.73 mg/dL — ABNORMAL HIGH (ref 0.44–1.00)
GFR calc Af Amer: 14 mL/min — ABNORMAL LOW (ref >60)
GFR calc non Af Amer: 12 mL/min — ABNORMAL LOW (ref >60)
Glucose, Bld: 86 mg/dL (ref 70–99)
Potassium: 3.7 mmol/L (ref 3.5–5.1)
Sodium: 136 mmol/L (ref 135–145)
Total Bilirubin: 0.7 mg/dL (ref 0.3–1.2)
Total Protein: 5.6 g/dL — ABNORMAL LOW (ref 6.5–8.1)

## 2020-01-15 LAB — CBC WITH DIFFERENTIAL/PLATELET
Abs Immature Granulocytes: 0.02 10*3/uL (ref 0.00–0.07)
Basophils Absolute: 0 10*3/uL (ref 0.0–0.1)
Basophils Relative: 0 %
Eosinophils Absolute: 0 10*3/uL (ref 0.0–0.5)
Eosinophils Relative: 0 %
HCT: 32.9 % — ABNORMAL LOW (ref 36.0–46.0)
Hemoglobin: 10.2 g/dL — ABNORMAL LOW (ref 12.0–15.0)
Immature Granulocytes: 0 %
Lymphocytes Relative: 18 %
Lymphs Abs: 0.9 10*3/uL (ref 0.7–4.0)
MCH: 31.2 pg (ref 26.0–34.0)
MCHC: 31 g/dL (ref 30.0–36.0)
MCV: 100.6 fL — ABNORMAL HIGH (ref 80.0–100.0)
Monocytes Absolute: 0.7 10*3/uL (ref 0.1–1.0)
Monocytes Relative: 14 %
Neutro Abs: 3.1 10*3/uL (ref 1.7–7.7)
Neutrophils Relative %: 68 %
Platelets: 136 10*3/uL — ABNORMAL LOW (ref 150–400)
RBC: 3.27 MIL/uL — ABNORMAL LOW (ref 3.87–5.11)
RDW: 17.2 % — ABNORMAL HIGH (ref 11.5–15.5)
WBC: 4.6 10*3/uL (ref 4.0–10.5)
nRBC: 0 % (ref 0.0–0.2)

## 2020-01-15 LAB — C-REACTIVE PROTEIN: CRP: 0.9 mg/dL (ref <1.0)

## 2020-01-15 LAB — GLUCOSE, CAPILLARY
Glucose-Capillary: 72 mg/dL (ref 70–99)
Glucose-Capillary: 113 mg/dL — ABNORMAL HIGH (ref 70–99)
Glucose-Capillary: 85 mg/dL (ref 70–99)
Glucose-Capillary: 44 mg/dL — CL (ref 70–99)
Glucose-Capillary: 98 mg/dL (ref 70–99)
Glucose-Capillary: 116 mg/dL — ABNORMAL HIGH (ref 70–99)

## 2020-01-15 LAB — CULTURE, BLOOD (ROUTINE X 2)
Culture: NO GROWTH
Culture: NO GROWTH
Special Requests: ADEQUATE

## 2020-01-15 LAB — BRAIN NATRIURETIC PEPTIDE: B Natriuretic Peptide: 4401.9 pg/mL — ABNORMAL HIGH (ref 0.0–100.0)

## 2020-01-15 LAB — MAGNESIUM: Magnesium: 1.8 mg/dL (ref 1.7–2.4)

## 2020-01-15 LAB — D-DIMER, QUANTITATIVE: D-Dimer, Quant: 0.48 ug/mL-FEU (ref 0.00–0.50)

## 2020-01-15 MED ORDER — ONDANSETRON HCL 4 MG/2ML IJ SOLN
4.0000 mg | Freq: Three times a day (TID) | INTRAMUSCULAR | Status: DC | PRN
  Administered 2020-01-15: 4 mg via INTRAVENOUS
  Filled 2020-01-15: qty 2

## 2020-01-15 NOTE — Progress Notes (Signed)
PROGRESS NOTE                                                                                                                                                                                                             Patient Demographics:    Robin [name], is a 69 y.o. female, DOB - December 25, 1950, VOH:[id number]  Outpatient Primary [facility] for the [facility] is [facility] [place] [facility]  Admit date - 01/10/2020    CC - SOB     Brief Narrative  - Robin [name] is a 69 y.o. female with medical history significant for diabetes mellitus, hypertension, end-stage renal disease on dialysis Tuesday Thursday Saturday, bipolar disorder, hypertension, dementia presents by EMS with decreased responsiveness and shortness of breath, she was diagnosed with Covid infection at the [place] facility on 01/10/2020 but was sent home as she was doing well.  Patient is quite demented and much of the history was obtained by the admitting physician through family.  She was admitted for the treatment of COVID-19 pneumonia.   Subjective:   Patient in bed in no distress, has mild chronic back pain, no chest or abdominal pain, no shortness of breath.   Assessment  & Plan :    1. Acute Hypoxic Resp. Failure due to Acute Covid 19 Viral Pneumonitis during the ongoing 2020 Covid 19 Pandemic - she is unvaccinated, so far appears to have mild to moderate disease, has been started on steroid and remdesivir combination which should be continued.  Encouraged the patient to sit up in chair in the daytime use I-S and flutter valve for pulmonary toiletry and then prone in bed when at night.  Will advance activity and titrate down oxygen as possible.   Recent Labs  Lab 01/08/20 2012 01/08/20 2012 01/10/20 1955 01/10/20 1955 01/10/20 2212 01/11/20 0441 01/11/20 0817 01/12/20 0622 01/13/20 1633 01/14/20 0827 01/15/20 0430  WBC 5.2   < > 3.9*   < >  --  3.4*  --   1.8* 3.6* 4.7 4.6  CRP  --   --  1.5*   < >  --  1.6*  --  2.2* 2.0* 1.1* 0.9  DDIMER  --   --  0.51*   < >  --   --  0.48 0.45 0.46 0.33 0.48  BNP  --   --   --   --   --   --  2,572.0* >4,500.0* 3,802.4* >4,500.0* 4,401.9*  PROCALCITON  --   --   --   --  0.50  --   --   --   --   --   --   LATICACIDVEN  --   --  1.2  --  0.9  --   --   --   --   --   --   AST 26   < > 35   < >  --  35  --  36 50* 35 35  ALT 16   < > 19   < >  --  16  --  _0 ALKPHOS 78   < > 72   < >  --  65  --  69 85 76 74  BILITOT 0.9   < > 1.1   < >  --  0.5  --  0.9 0.9 1.0 0.7  ALBUMIN 3.1*   < > 3.0*   < >  --  2.6*  --  2.7* 2.7* 2.9* 2.8*  INR 1.4*  --   --   --   --   --   --   --   --   --   --    < > = values in this interval not displayed.       2.  ESRD.  On TTS schedule.  Nephrology on board.  3.  Dementia.  At risk for delirium.  Supportive care.  4.  CAD.  S/p 2 stents in the past.  Chest pain-free, to new combination of aspirin, statin, beta-blocker, Imdur and Eliquis.  5.  Hypertension.  Currently stable on combination of Norvasc, beta-blocker, Imdur.  Continue and monitor.  6.  Dyslipidemia.  On statin.  7.  Asymptomatic 9 beat NSVT vs Artifact on 8/21 - EF 55%, B Blocker increased, Mag 2.1  8. DM type II.  On combination of Levemir and sliding scale will monitor and adjust.  Lab Results  Component Value Date   HGBA1C 8.1 (H) 01/10/2020   CBG (last 3)  Recent Labs    01/14/20 2156 01/14/20 2157 01/15/20 0747  GLUCAP 61* 72 72      Condition -   Guarded  Family Communication  :  Daughter Judson Roch [phone]  01/11/20 - 7:57 am, message left on 01/12/2020 at 12:30 PM, 01/13/20    Code Status :  Full  Consults  :  Renal  Procedures  :  None  PUD Prophylaxis : PPI  Disposition Plan  :    Status is: Inpatient  Remains inpatient appropriate because:IV treatments appropriate due to intensity of illness or inability to take PO   Dispo: The patient is from: Home               Anticipated d/c is to: SNF              Anticipated d/c date is: 3 days              Patient currently is not medically stable to d/c.   DVT Prophylaxis  :  Eliquis  Lab Results  Component Value Date   PLT 136 (L) 01/15/2020    Diet :  Diet Order            Diet renal/carb modified with fluid restriction Diet-HS Snack? Nothing; Fluid restriction: 1200 mL Fluid; Room service appropriate? Yes; Fluid consistency: Thin  Diet effective now  Inpatient Medications  Scheduled Meds: . amLODipine  5 mg Oral Daily  . apixaban  2.5 mg Oral BID  . vitamin C  500 mg Oral Daily  . aspirin  81 mg Oral Daily  . atorvastatin  20 mg Oral QHS  . calcitRIOL  2.5 mcg Oral Q T,Th,Sa-HD  . Chlorhexidine Gluconate Cloth  6 each Topical Q0600  . darbepoetin (ARANESP) injection - DIALYSIS  40 mcg Intravenous Q Sat-HD  . dexamethasone (DECADRON) injection  6 mg Intravenous Q24H  . insulin aspart  0-5 Units Subcutaneous QHS  . insulin aspart  0-9 Units Subcutaneous TID WC  . insulin detemir  20 Units Subcutaneous BID  . isosorbide mononitrate  60 mg Oral Daily  . melatonin  10 mg Oral QHS  . metoprolol tartrate  100 mg Oral BID  . multivitamin  1 tablet Oral QHS  . pantoprazole  20 mg Oral BID  . zinc sulfate  220 mg Oral Daily   Continuous Infusions:  PRN Meds:.acetaminophen, albuterol, chlorpheniramine-HYDROcodone, guaiFENesin-dextromethorphan, loperamide, senna-docusate  Antibiotics  :    Anti-infectives (From admission, onward)   Start     Dose/Rate Route Frequency Ordered Stop   01/11/20 1000  remdesivir 100 mg in sodium chloride 0.9 % 100 mL IVPB       "Followed by" Linked Group Details   100 mg 200 mL/hr over 30 Minutes Intravenous Every 24 hours 01/10/20 2129 01/14/20 1918   01/11/20 1000  remdesivir 100 mg in sodium chloride 0.9 % 100 mL IVPB  Status:  Discontinued       "Followed by" Linked Group Details   100 mg 200 mL/hr over 30 Minutes Intravenous  Daily 01/10/20 2321 01/10/20 2339   01/10/20 2321  remdesivir 200 mg in sodium chloride 0.9% 250 mL IVPB  Status:  Discontinued       "Followed by" Linked Group Details   200 mg 580 mL/hr over 30 Minutes Intravenous Once 01/10/20 2321 01/10/20 2339   01/10/20 2200  remdesivir 200 mg in sodium chloride 0.9% 250 mL IVPB       "Followed by" Linked Group Details   200 mg 580 mL/hr over 30 Minutes Intravenous Once 01/10/20 2129 01/11/20 0135       Time Spent in minutes  30   Lala Lund M.D on 01/15/2020 at 10:01 AM  To page go to [email or web address] - password [place]  [facility] -  Office  [phone]     See all Orders from today for further details    Objective:   Vitals:   01/14/20 1530 01/14/20 1717 01/14/20 2208 01/15/20 0532  BP: (!) 140/57 (!) 142/75 137/61 (!) 152/73  Pulse: (!) 59 65 66 63  Resp:  _0 Temp:  98.4 F (36.9 C) 97.7 F (36.5 C) 97.7 F (36.5 C)  TempSrc:  Oral Oral Oral  SpO2:  100% 97% 95%  Weight:  71.5 kg    Height:        Wt Readings from Last 3 Encounters:  01/14/20 71.5 kg  01/03/20 69.9 kg  11/23/19 69.9 kg     Intake/Output Summary (Last 24 hours) at 01/15/2020 1001 Last data filed at 01/14/2020 1550 Gross per 24 hour  Intake --  Output 2000 ml  Net -2000 ml     Physical Exam  Awake mildly confused in bed in no distress, moving all 4 extremities,  [place].AT,PERRAL Supple Neck,No JVD, No cervical lymphadenopathy appriciated.  Symmetrical Chest wall movement, Good air movement bilaterally, CTAB  RRR,No Gallops, Rubs or new Murmurs, No Parasternal Heave +ve B.Sounds, Abd Soft, No tenderness, No organomegaly appriciated, No rebound - guarding or rigidity. No Cyanosis, Clubbing or edema, No new Rash or bruise     Data Review:    CBC Recent Labs  Lab 01/11/20 0441 01/12/20 0622 01/13/20 1633 01/14/20 0827 01/15/20 0430  WBC 3.4* 1.8* 3.6* 4.7 4.6  HGB 8.9* 9.4* 10.1* 10.4* 10.2*  HCT 29.3* 30.8* 31.5* 33.0*  32.9*  PLT 87* 90* 107* 144* 136*  MCV 105.4* 104.1* 99.7 100.3* 100.6*  MCH 32.0 31.8 32.0 31.6 31.2  MCHC 30.4 30.5 32.1 31.5 31.0  RDW 18.2* 17.9* 17.1* 17.2* 17.2*  LYMPHSABS 0.4* 0.4* 0.5* 0.5* 0.9  MONOABS 0.2 0.1 0.3 0.3 0.7  EOSABS 0.0 0.0 0.0 0.0 0.0  BASOSABS 0.0 0.0 0.0 0.0 0.0    Chemistries  Recent Labs  Lab 01/08/20 2012 01/08/20 2021 01/10/20 1605 01/10/20 1955 01/11/20 0441 01/11/20 0817 01/12/20 0622 01/13/20 1633 01/14/20 0827 01/15/20 0430  NA 136   < >  --    < > 134*  --  136 137 141 136  K 4.1   < >  --    < > 3.7  --  4.3 4.4 4.1 3.7  CL 94*   < >  --    < > 97*  --  96* 98 98 96*  CO2 29  --   --    < > 23  --  _0 GLUCOSE 138*   < >  --    < > 146*  --  164* 148* 62* 86  BUN 16   < >  --    < > 38*  --  29* 19 33* 17  CREATININE 6.98*   < >  --    < > 11.09*  --  7.11* 3.76* 5.24* 3.73*  CALCIUM 9.1  --   --    < > 8.3*  --  9.2 9.1 9.4 8.6*  AST 26  --   --    < > 35  --  36 50* 35 35  ALT 16  --   --    < > 16  --  _1 ALKPHOS 78  --   --    < > 65  --  69 85 76 74  BILITOT 0.9  --   --    < > 0.5  --  0.9 0.9 1.0 0.7  MG  --   --   --   --   --  2.4 2.2 2.1 2.1 1.8  INR 1.4*  --   --   --   --   --   --   --   --   --   HGBA1C  --   --  8.1*  --   --   --   --   --   --   --    < > = values in this interval not displayed.     ------------------------------------------------------------------------------------------------------------------ No results for input(s): CHOL, HDL, LDLCALC, TRIG, CHOLHDL, LDLDIRECT in the last 72 hours.  Lab Results  Component Value Date   HGBA1C 8.1 (H) 01/10/2020   ------------------------------------------------------------------------------------------------------------------ No results for input(s): TSH, T4TOTAL, T3FREE, THYROIDAB in the last 72 hours.  Invalid input(s): FREET3  Cardiac Enzymes No results for input(s): CKMB, TROPONINI, MYOGLOBIN in the last 168 hours.  Invalid  input(s): CK ------------------------------------------------------------------------------------------------------------------  Component Value Date/Time   BNP 4,401.9 (H) 01/15/2020 0430    Micro Results Recent Results (from the past 240 hour(s))  Blood Culture (routine x 2)     Status: None   Collection Time: 01/10/20  4:05 PM   Specimen: BLOOD RIGHT FOREARM  Result Value Ref Range Status   Specimen Description BLOOD RIGHT FOREARM  Final   Special Requests   Final    BOTTLES DRAWN AEROBIC AND ANAEROBIC Blood Culture adequate volume   Culture   Final    NO GROWTH 5 DAYS Performed at [facility], [address] [place], [place] [address]    Report Status 01/15/2020 FINAL  Final  Blood Culture (routine x 2)     Status: None   Collection Time: 01/10/20 10:12 PM   Specimen: BLOOD RIGHT HAND  Result Value Ref Range Status   Specimen Description BLOOD RIGHT HAND  Final   Special Requests   Final    BOTTLES DRAWN AEROBIC AND ANAEROBIC Blood Culture results may not be optimal due to an inadequate volume of blood received in culture bottles   Culture   Final    NO GROWTH 5 DAYS Performed at [facility], [place] [address], [place], [place] [address]    Report Status 01/15/2020 FINAL  Final    Radiology Reports CT HEAD WO CONTRAST  Result Date: 01/08/2020 CLINICAL DATA:  Mental status change EXAM: CT HEAD WITHOUT CONTRAST TECHNIQUE: Contiguous axial images were obtained from the base of the skull through the vertex without intravenous contrast. COMPARISON:  CT head dated 09/30/2019 FINDINGS: Brain: No evidence of acute infarction, hemorrhage, hydrocephalus, extra-axial collection or mass lesion/mass effect. There is mild cerebral volume loss with associated ex vacuo dilatation. Periventricular white matter hypoattenuation likely represents chronic small vessel ischemic disease. Vascular: There are vascular calcifications in the carotid siphons. Skull: Normal. Negative for  fracture or focal lesion. Sinuses/Orbits: No acute finding. Other: None. IMPRESSION: No acute intracranial process. Electronically Signed   By: Zerita Boers M.D.   On: 01/08/2020 20:42   CT ANKLE LEFT WO CONTRAST  Result Date: 12/21/2019 CLINICAL DATA:  Medial left ankle pain and swelling for 3 months. History of prior subtalar and tibiotalar fusion. EXAM: CT OF THE LEFT ANKLE WITHOUT CONTRAST TECHNIQUE: Multidetector CT imaging of the left ankle was performed according to the standard protocol. Multiplanar CT image reconstructions were also generated. COMPARISON:  Plain films of the left foot 07/16/2016 and plain films left ankle 11/18/2019. FINDINGS: Bones/Joint/Cartilage The patient has a retrograde nail in place for subtalar and tibiotalar fusion. There is extensive lucency about the nail throughout its length. Marked lucency is also present about a screw in the distal tibia. Two screws in the calcaneus are also in place without surrounding lucency. The hardware is intact. There is mild lateral angulation of the nail. No bridging bone is seen across the tibiotalar or subtalar joints. The nail has mild lateral angulation of approximately 11 degrees at the level of the metaphysis of the tibia. There is lateral hindfoot angulation of 38 degrees. Two screws are in place in the medial malleolus for fracture fixation. The fracture is healed. The distal fibula has been resected. Bridging bone between the distal 1.5 cm of the fibula and tibia is identified. Ligaments Suboptimally assessed by CT. Muscles and Tendons No tendon entrapment.  Tendons appear intact. Soft tissues There is some subcutaneous edema present. No focal fluid collection or mass is identified. IMPRESSION: Status post subtalar and tibiotalar  fusion. There is no bridging bone across either joint. Extensive lucency about the intramedullary nail and screw in the distal tibia is consistent with motion and loosening. Hindfoot valgus. Healed medial  malleolar fracture with 2 fixation screws in place. Electronically Signed   By: Inge Rise M.D.   On: 12/21/2019 10:37   PERIPHERAL VASCULAR CATHETERIZATION  Result Date: 01/03/2020 Patient name: ZAFIRO ROUTSON MRN: [id number] DOB: 1950/11/04 Sex: female 01/03/2020 Pre-operative Diagnosis: Bleeding from left upper arm dialysis graft Post-operative diagnosis:  Same Surgeon:  Annamarie Major Procedure Performed:  1.  Ultrasound guided access, left upper arm dialysis graft  2.  Shuntogram  3.  Balloon venoplasty, left axillary vein (peripheral)  4.  Conscious sedation, 25 minutes  Indications: The patient came to the ER today for the second time in 3 days for bleeding from her dialysis graft.  I have recommended shuntogram to evaluate for central stenosis. Procedure:  The patient was identified in the holding area and taken to room 8.  The patient was then placed supine on the table and prepped and draped in the usual sterile fashion.  A time out was called.  Conscious sedation was administered with the use of IV fentanyl and Versed under continuous physician and nurse monitoring.  Heart rate, blood pressure, and oxygen saturations were continuously monitored.  Total sedation time was 25 minutes ultrasound was used to evaluate the fistula.  The vein was patent and compressible.  A digital ultrasound image was acquired.  The fistula was then accessed under ultrasound guidance using a micropuncture needle.  An 018 wire was then asvanced without resistance and a micropuncture sheath was placed.  Contrast injections were then performed through the sheath. Findings: The central venous system is widely patent.  A stent is visualized at the venous outflow tract.  Just beyond the stent there is a 80% stenosis within the axillary vein.  The arterial venous anastomosis is widely patent.  Intervention: After the above images were acquired the decision was made to proceed with intervention.  A 7 French sheath was inserted over  a Bentson wire.  I used a Glidewire to cross the lesion.  I then proceeded with balloon venoplasty using an 8 x 40 Mustang balloon.  Follow-up imaging revealed improved but suboptimal result and so I upsized to a 12 x 40 Mustang balloon.  This was taken to 20 atm.  Follow-up imaging revealed complete resolution of the stenosis.  The access site was closed with a 4-0 Monocryl. Impression:  #1  Greater than 80% stenosis just beyond the venous outflow stent treated using an 8 mm followed by 12 mm balloon with residual stenosis less than 10%.  Theotis Burrow, M.D., FACS [facility] Office: [phone] Pager:  [phone]  DG Chest Port 1 View  Result Date: 01/10/2020 CLINICAL DATA:  Hypoxia, shortness of breath, COVID-19 positive EXAM: PORTABLE CHEST 1 VIEW COMPARISON:  09/30/2019 FINDINGS: Single frontal view of the chest demonstrates persistent enlargement of the cardiac silhouette. There is chronic interstitial prominence and mild central vascular congestion. No focal airspace disease, effusion, or pneumothorax. No acute bony abnormalities. IMPRESSION: 1. Chronic central vascular congestion and interstitial prominence. No acute airspace disease. Electronically Signed   By: [name] M.D.   On: 01/10/2020 16:10

## 2020-01-15 NOTE — Progress Notes (Signed)
Hypoglycemic Event  CBG: 44  Treatment: 2 apple juices, graham crackers, and peanut butter  Symptoms: asymptomatic  Follow-up CBG: Time:940 CBG Result: 98  Possible Reasons for Event: no appetite  Comments/MD notified: B. Chotiner, MD    Toni Amend

## 2020-01-15 NOTE — Progress Notes (Signed)
Subjective:  Hd yesterday, net uf 2L. Had bleeding from AVF which could not be stopped. Eventually resolved with pressure. No bleeding since then. hgb stable 10.4->10.2  Objective Vital signs in last 24 hours: Vitals:   01/14/20 1530 01/14/20 1717 01/14/20 2208 01/15/20 0532  BP: (!) 140/57 (!) 142/75 137/61 (!) 152/73  Pulse: (!) 59 65 66 63  Resp:  20 20 19   Temp:  98.4 F (36.9 C) 97.7 F (36.5 C) 97.7 F (36.5 C)  TempSrc:  Oral Oral Oral  SpO2:  100% 97% 95%  Weight:  71.5 kg    Height:       Weight change: -0.5 kg  Physical Exam: Limited exam 8/22 General: Alert chronically ill appearing female , NAD  Heart: reg rate Lungs: normal wob, unlabored, bl chest expansion Extremities:  No pedal edema  Dialysis Access: LUA AVF dressings in place    Labs: Basic Metabolic Panel: Recent Labs  Lab 01/13/20 1633 01/14/20 0827 01/15/20 0430  NA 137 141 136  K 4.4 4.1 3.7  CL 98 98 96*  CO2 27 27 28   GLUCOSE 148* 62* 86  BUN 19 33* 17  CREATININE 3.76* 5.24* 3.73*  CALCIUM 9.1 9.4 8.6*   Liver Function Tests: Recent Labs  Lab 01/13/20 1633 01/14/20 0827 01/15/20 0430  AST 50* 35 35  ALT 21 22 21   ALKPHOS 85 76 74  BILITOT 0.9 1.0 0.7  PROT 5.6* 5.9* 5.6*  ALBUMIN 2.7* 2.9* 2.8*   No results for input(s): LIPASE, AMYLASE in the last 168 hours. No results for input(s): AMMONIA in the last 168 hours. CBC: Recent Labs  Lab 01/11/20 0441 01/11/20 0441 01/12/20 0622 01/12/20 0622 01/13/20 1633 01/14/20 0827 01/15/20 0430  WBC 3.4*   < > 1.8*   < > 3.6* 4.7 4.6  NEUTROABS 2.8   < > 1.3*   < > 2.8 4.0 3.1  HGB 8.9*   < > 9.4*   < > 10.1* 10.4* 10.2*  HCT 29.3*   < > 30.8*   < > 31.5* 33.0* 32.9*  MCV 105.4*  --  104.1*  --  99.7 100.3* 100.6*  PLT 87*   < > 90*   < > 107* 144* 136*   < > = values in this interval not displayed.   Cardiac Enzymes: No results for input(s): CKTOTAL, CKMB, CKMBINDEX, TROPONINI in the last 168 hours. CBG: Recent Labs  Lab  01/14/20 1222 01/14/20 1802 01/14/20 2156 01/14/20 2157 01/15/20 0747  GLUCAP 119* 172* 61* 72 72    Studies/Results: No results found. Medications:  . amLODipine  5 mg Oral Daily  . apixaban  2.5 mg Oral BID  . vitamin C  500 mg Oral Daily  . aspirin  81 mg Oral Daily  . atorvastatin  20 mg Oral QHS  . calcitRIOL  2.5 mcg Oral Q T,Th,Sa-HD  . Chlorhexidine Gluconate Cloth  6 each Topical Q0600  . darbepoetin (ARANESP) injection - DIALYSIS  40 mcg Intravenous Q Sat-HD  . dexamethasone (DECADRON) injection  6 mg Intravenous Q24H  . insulin aspart  0-5 Units Subcutaneous QHS  . insulin aspart  0-9 Units Subcutaneous TID WC  . insulin detemir  20 Units Subcutaneous BID  . isosorbide mononitrate  60 mg Oral Daily  . melatonin  10 mg Oral QHS  . metoprolol tartrate  100 mg Oral BID  . multivitamin  1 tablet Oral QHS  . pantoprazole  20 mg Oral BID  . zinc sulfate  220 mg Oral Daily    Outpatient Orders TTS/[place] 3.5hrs, F180, BFR 400/autoflow 1.5, edw 74.5kg, 2k, 2cal, 137na, 35bicarb, uf profile #1 No heparin venofer 100mg  qtreatment mircera 64mcg q4weeks last given 12/01/19   Problem/Plan: 1. AHRF 2/2 acute COVID-19 pneumonitis: steroids, remdesivir per primary service 2. Bleeding access post HD 8/21: not receiving heparin, if re-bleeds again will need fistulogram with IR. hgb stable 3. ESRD on HD TTS. Will maintain tts schedule. Next hd 8/24. 4. Anemia of chronic disease. HGB 10.4 Avoid iron, on aranesp here 33mcg qweekly first dose 8/19 (OP Mircera q 4 weeks  At dc  Need q 2 weeks ) 5. HTN/volume: bp stable, Will uf as tolerated 6. DM2, per primary 7. CAD s/p stents x 2. On eliquis, asa, statin, BB, imdur Additional recommendations: - Dose all meds for creatinine clearance <10 ml/min  - Unless absolutely necessary, no MRIs with gadolinium.  - Implement save arm precautions. Prefer needle sticks in the dorsum of the hands or wrists. No blood pressure  measurements in arm. - If blood transfusion is requested during hemodialysis sessions, please alert [place] prior to the session.   Gean Quint, MD [facility]

## 2020-01-16 DIAGNOSIS — I25110 Atherosclerotic heart disease of native coronary artery with unstable angina pectoris: Secondary | ICD-10-CM

## 2020-01-16 DIAGNOSIS — I2510 Atherosclerotic heart disease of native coronary artery without angina pectoris: Secondary | ICD-10-CM

## 2020-01-16 DIAGNOSIS — R6510 Systemic inflammatory response syndrome (SIRS) of non-infectious origin without acute organ dysfunction: Secondary | ICD-10-CM

## 2020-01-16 LAB — GLUCOSE, CAPILLARY
Glucose-Capillary: 44 mg/dL — CL (ref 70–99)
Glucose-Capillary: 32 mg/dL — CL (ref 70–99)
Glucose-Capillary: 73 mg/dL (ref 70–99)
Glucose-Capillary: 105 mg/dL — ABNORMAL HIGH (ref 70–99)
Glucose-Capillary: 54 mg/dL — ABNORMAL LOW (ref 70–99)
Glucose-Capillary: 64 mg/dL — ABNORMAL LOW (ref 70–99)
Glucose-Capillary: 101 mg/dL — ABNORMAL HIGH (ref 70–99)
Glucose-Capillary: 69 mg/dL — ABNORMAL LOW (ref 70–99)
Glucose-Capillary: 252 mg/dL — ABNORMAL HIGH (ref 70–99)

## 2020-01-16 MED ORDER — INSULIN DETEMIR 100 UNIT/ML ~~LOC~~ SOLN
12.0000 [IU] | Freq: Two times a day (BID) | SUBCUTANEOUS | Status: DC
  Filled 2020-01-16 (×2): qty 0.12

## 2020-01-16 MED ORDER — INSULIN DETEMIR 100 UNIT/ML ~~LOC~~ SOLN
12.0000 [IU] | Freq: Every day | SUBCUTANEOUS | Status: DC
  Administered 2020-01-16: 12 [IU] via SUBCUTANEOUS
  Filled 2020-01-16 (×2): qty 0.12

## 2020-01-16 NOTE — Consult Note (Signed)
   [facility] Inpatient Consult   01/16/2020  ONISHA CEDENO 07/22/1950 [id number]   [place] [facility] [[facility]] Patient:  Medicare [facility] noted   Patient screened for extreme high risk score for unplanned readmission score and for hospitalizations to check if potential [facility] service needs.  Review of patient's medical record reveals patient is currently being recommended for skilled nursing facility.  Patient has HD on TTS.  Primary Care Provider is noted as the [facility].   Plan:  Follow for disposition.  For questions contact:   Natividad Brood, RN BSN [facility] Liaison  [phone] business mobile phone Toll free office [phone]  Fax number: [phone] [email or web address]

## 2020-01-16 NOTE — Progress Notes (Signed)
Hypoglycemic Event  CBG: 64  Treatment: 8 oz juice/soda  Symptoms: None  Follow-up CBG: Time:1723 CBG Result:54  Possible Reasons for Event: Inadequate meal intake   Hypoglycemic Event  CBG: 54  Treatment: 8 oz juice/soda  Symptoms: None  Follow-up CBG: Time:1743 CBG Result:69  Possible Reasons for Event: Inadequate meal intake  Hypoglycemic Event  CBG: 69  Treatment: 8 oz juice/soda  Symptoms: None  Follow-up CBG: Time:1827 CBG Result:101  Possible Reasons for Event: Inadequate meal intake   Robin [name]

## 2020-01-16 NOTE — Progress Notes (Addendum)
PROGRESS NOTE                                                                                                                                                                                                             Patient Demographics:    Robin [name], is a 69 y.o. female, DOB - [birth year], SPQ:[id number]  Outpatient Primary MD for the patient is [facility] - [facility]  Admit date - 01/10/2020    CC - SOB     Brief Narrative  - Robin [name] is a 69 y.o. female with medical history significant for diabetes mellitus, hypertension, end-stage renal disease on dialysis Tuesday Thursday Saturday, bipolar disorder, hypertension, dementia presents by EMS with decreased responsiveness and shortness of breath, she was diagnosed with Covid infection at the [place] facility on 01/10/2020 but was sent home as she was doing well.  Patient is quite demented and much of the history was obtained by the admitting physician through family.  She was admitted for the treatment of COVID-19 pneumonia.   Subjective:   Patient in bed, appears comfortable, denies any headache, no fever, no chest pain or pressure, no shortness of breath , no abdominal pain. No focal weakness.    Assessment  & Plan :    1. Acute Hypoxic Resp. Failure due to Acute Covid 19 Viral Pneumonitis during the ongoing 2020 Covid 19 Pandemic - she is unvaccinated, so far appears to have mild to moderate disease, has been started on steroid and remdesivir combination which should be continued.  Encouraged the patient to sit up in chair in the daytime use I-S and flutter valve for pulmonary toiletry and then prone in bed when at night.  Will advance activity and titrate down oxygen as possible.   Recent Labs  Lab 01/10/20 1955 01/10/20 1955 01/10/20 2212 01/11/20 0441 01/11/20 0817 01/12/20 0622 01/13/20 1633 01/14/20 0827 01/15/20 0430  WBC 3.9*   < >  --  3.4*   --  1.8* 3.6* 4.7 4.6  CRP 1.5*   < >  --  1.6*  --  2.2* 2.0* 1.1* 0.9  DDIMER 0.51*   < >  --   --  0.48 0.45 0.46 0.33 0.48  BNP  --   --   --   --  2,572.0* >4,500.0* 3,802.4* >  4,500.0* 4,401.9*  PROCALCITON  --   --  0.50  --   --   --   --   --   --   LATICACIDVEN 1.2  --  0.9  --   --   --   --   --   --   AST 35   < >  --  35  --  36 50* 35 35  ALT 19   < >  --  16  --  $R'19 21 22 21  'OG$ ALKPHOS 72   < >  --  65  --  69 85 76 74  BILITOT 1.1   < >  --  0.5  --  0.9 0.9 1.0 0.7  ALBUMIN 3.0*   < >  --  2.6*  --  2.7* 2.7* 2.9* 2.8*   < > = values in this interval not displayed.       2.  ESRD.  On TTS schedule.  Nephrology on board.  3.  Dementia.  At risk for delirium.  Supportive care.  4.  CAD.  S/p 2 stents in the past.  Chest pain-free, to new combination of aspirin, statin, beta-blocker, Imdur and Eliquis.  5.  Hypertension.  Currently stable on combination of Norvasc, beta-blocker, Imdur.  Continue and monitor.  6.  Dyslipidemia.  On statin.  7.  Asymptomatic 9 beat NSVT vs Artifact on 8/21 - EF 55%, B Blocker increased, Mag 2.1  8. DM type II. Poor oral intake, Levemir dose cut in half, continue sliding scale and monitor  Lab Results  Component Value Date   HGBA1C 8.1 (H) 01/10/2020   CBG (last 3)  Recent Labs    01/15/20 2302 01/16/20 0805 01/16/20 0842  GLUCAP 116* 32* 73      Condition -   Guarded  Family Communication  :  Daughter Robin [name] [phone]  01/11/20 - 7:57 am, message left on 01/12/2020 at 12:30 PM, says left again on 01/16/2020 at 9:42 AM   Code Status :  Full  Consults  :  Renal  Procedures  :  None  PUD Prophylaxis : PPI  Disposition Plan  :    Status is: Inpatient  Remains inpatient appropriate because:IV treatments appropriate due to intensity of illness or inability to take PO   Dispo: The patient is from: Home              Anticipated d/c is to: SNF              Anticipated d/c date is: 3 days              Patient  currently is not medically stable to d/c.   DVT Prophylaxis  :  Eliquis  Lab Results  Component Value Date   PLT 136 (L) 01/15/2020    Diet :  Diet Order            Diet renal/carb modified with fluid restriction Diet-HS Snack? Nothing; Fluid restriction: 1200 mL Fluid; Room service appropriate? Yes; Fluid consistency: Thin  Diet effective now                  Inpatient Medications  Scheduled Meds: . amLODipine  5 mg Oral Daily  . apixaban  2.5 mg Oral BID  . vitamin C  500 mg Oral Daily  . aspirin  81 mg Oral Daily  . atorvastatin  20 mg Oral QHS  . calcitRIOL  2.5 mcg Oral Q T,Th,Sa-HD  .  Chlorhexidine Gluconate Cloth  6 each Topical Q0600  . darbepoetin (ARANESP) injection - DIALYSIS  40 mcg Intravenous Q Sat-HD  . dexamethasone (DECADRON) injection  6 mg Intravenous Q24H  . insulin aspart  0-5 Units Subcutaneous QHS  . insulin aspart  0-9 Units Subcutaneous TID WC  . insulin detemir  12 Units Subcutaneous QHS  . isosorbide mononitrate  60 mg Oral Daily  . melatonin  10 mg Oral QHS  . metoprolol tartrate  100 mg Oral BID  . multivitamin  1 tablet Oral QHS  . pantoprazole  20 mg Oral BID  . zinc sulfate  220 mg Oral Daily   Continuous Infusions:  PRN Meds:.acetaminophen, albuterol, chlorpheniramine-HYDROcodone, guaiFENesin-dextromethorphan, loperamide, ondansetron (ZOFRAN) IV, senna-docusate  Antibiotics  :    Anti-infectives (From admission, onward)   Start     Dose/Rate Route Frequency Ordered Stop   01/11/20 1000  remdesivir 100 mg in sodium chloride 0.9 % 100 mL IVPB       "Followed by" Linked Group Details   100 mg 200 mL/hr over 30 Minutes Intravenous Every 24 hours 01/10/20 2129 01/14/20 1918   01/11/20 1000  remdesivir 100 mg in sodium chloride 0.9 % 100 mL IVPB  Status:  Discontinued       "Followed by" Linked Group Details   100 mg 200 mL/hr over 30 Minutes Intravenous Daily 01/10/20 2321 01/10/20 2339   01/10/20 2321  remdesivir 200 mg in sodium  chloride 0.9% 250 mL IVPB  Status:  Discontinued       "Followed by" Linked Group Details   200 mg 580 mL/hr over 30 Minutes Intravenous Once 01/10/20 2321 01/10/20 2339   01/10/20 2200  remdesivir 200 mg in sodium chloride 0.9% 250 mL IVPB       "Followed by" Linked Group Details   200 mg 580 mL/hr over 30 Minutes Intravenous Once 01/10/20 2129 01/11/20 0135       Time Spent in minutes  30   Lala Lund M.D on 01/16/2020 at 9:39 AM  To page go to [email or web address] - password [facility]  [facility] -  Office  [phone]     See all Orders from today for further details    Objective:   Vitals:   01/15/20 1855 01/15/20 2315 01/16/20 0534 01/16/20 0925  BP: (!) 156/78 133/65 140/72 125/66  Pulse: 66 68 66   Resp: $Remo'19 18 17   'CReXQ$ Temp: 98 F (36.7 C) 97.8 F (36.6 C) 97.6 F (36.4 C)   TempSrc: Oral Oral Oral   SpO2: 96% 99% 99%   Weight:      Height:        Wt Readings from Last 3 Encounters:  01/14/20 71.5 kg  01/03/20 69.9 kg  11/23/19 69.9 kg     Intake/Output Summary (Last 24 hours) at 01/16/2020 0939 Last data filed at 01/15/2020 1700 Gross per 24 hour  Intake 360 ml  Output --  Net 360 ml     Physical Exam  Awake but minimally confused, no focal deficits,  [place].AT,PERRAL Supple Neck,No JVD, No cervical lymphadenopathy appriciated.  Symmetrical Chest wall movement, Good air movement bilaterally, CTAB RRR,No Gallops, Rubs or new Murmurs, No Parasternal Heave +ve B.Sounds, Abd Soft, No tenderness, No organomegaly appriciated, No rebound - guarding or rigidity. No Cyanosis, Clubbing or edema, No new Rash or bruise    Data Review:    CBC Recent Labs  Lab 01/11/20 0441 01/12/20 0622 01/13/20 1633 01/14/20 0827 01/15/20 0430  WBC 3.4* 1.8* 3.6*  4.7 4.6  HGB 8.9* 9.4* 10.1* 10.4* 10.2*  HCT 29.3* 30.8* 31.5* 33.0* 32.9*  PLT 87* 90* 107* 144* 136*  MCV 105.4* 104.1* 99.7 100.3* 100.6*  MCH 32.0 31.8 32.0 31.6 31.2  MCHC 30.4 30.5 32.1 31.5  31.0  RDW 18.2* 17.9* 17.1* 17.2* 17.2*  LYMPHSABS 0.4* 0.4* 0.5* 0.5* 0.9  MONOABS 0.2 0.1 0.3 0.3 0.7  EOSABS 0.0 0.0 0.0 0.0 0.0  BASOSABS 0.0 0.0 0.0 0.0 0.0    Chemistries  Recent Labs  Lab 01/10/20 1605 01/10/20 1955 01/11/20 0441 01/11/20 0817 01/12/20 0622 01/13/20 1633 01/14/20 0827 01/15/20 0430  NA  --    < > 134*  --  136 137 141 136  K  --    < > 3.7  --  4.3 4.4 4.1 3.7  CL  --    < > 97*  --  96* 98 98 96*  CO2  --    < > 23  --  $R'26 27 27 28  'tL$ GLUCOSE  --    < > 146*  --  164* 148* 62* 86  BUN  --    < > 38*  --  29* 19 33* 17  CREATININE  --    < > 11.09*  --  7.11* 3.76* 5.24* 3.73*  CALCIUM  --    < > 8.3*  --  9.2 9.1 9.4 8.6*  AST  --    < > 35  --  36 50* 35 35  ALT  --    < > 16  --  $R'19 21 22 21  'Hr$ ALKPHOS  --    < > 65  --  69 85 76 74  BILITOT  --    < > 0.5  --  0.9 0.9 1.0 0.7  MG  --   --   --  2.4 2.2 2.1 2.1 1.8  HGBA1C 8.1*  --   --   --   --   --   --   --    < > = values in this interval not displayed.     ------------------------------------------------------------------------------------------------------------------ No results for input(s): CHOL, HDL, LDLCALC, TRIG, CHOLHDL, LDLDIRECT in the last 72 hours.  Lab Results  Component Value Date   HGBA1C 8.1 (H) 01/10/2020   ------------------------------------------------------------------------------------------------------------------ No results for input(s): TSH, T4TOTAL, T3FREE, THYROIDAB in the last 72 hours.  Invalid input(s): FREET3  Cardiac Enzymes No results for input(s): CKMB, TROPONINI, MYOGLOBIN in the last 168 hours.  Invalid input(s): CK ------------------------------------------------------------------------------------------------------------------    Component Value Date/Time   BNP 4,401.9 (H) 01/15/2020 0430    Micro Results Recent Results (from the past 240 hour(s))  Blood Culture (routine x 2)     Status: None   Collection Time: 01/10/20  4:05 PM   Specimen:  BLOOD RIGHT FOREARM  Result Value Ref Range Status   Specimen Description BLOOD RIGHT FOREARM  Final   Special Requests   Final    BOTTLES DRAWN AEROBIC AND ANAEROBIC Blood Culture adequate volume   Culture   Final    NO GROWTH 5 DAYS Performed at [facility], [address] [place], [place] [address]    Report Status 01/15/2020 FINAL  Final  Blood Culture (routine x 2)     Status: None   Collection Time: 01/10/20 10:12 PM   Specimen: BLOOD RIGHT HAND  Result Value Ref Range Status   Specimen Description BLOOD RIGHT HAND  Final   Special Requests  Final    BOTTLES DRAWN AEROBIC AND ANAEROBIC Blood Culture results may not be optimal due to an inadequate volume of blood received in culture bottles   Culture   Final    NO GROWTH 5 DAYS Performed at [facility], [address] [place], [place] [address]    Report Status 01/15/2020 FINAL  Final    Radiology Reports CT HEAD WO CONTRAST  Result Date: 01/08/2020 CLINICAL DATA:  Mental status change EXAM: CT HEAD WITHOUT CONTRAST TECHNIQUE: Contiguous axial images were obtained from the base of the skull through the vertex without intravenous contrast. COMPARISON:  CT head dated 09/30/2019 FINDINGS: Brain: No evidence of acute infarction, hemorrhage, hydrocephalus, extra-axial collection or mass lesion/mass effect. There is mild cerebral volume loss with associated ex vacuo dilatation. Periventricular white matter hypoattenuation likely represents chronic small vessel ischemic disease. Vascular: There are vascular calcifications in the carotid siphons. Skull: Normal. Negative for fracture or focal lesion. Sinuses/Orbits: No acute finding. Other: None. IMPRESSION: No acute intracranial process. Electronically Signed   By: Romona Curls M.D.   On: 01/08/2020 20:42   CT ANKLE LEFT WO CONTRAST  Result Date: 12/21/2019 CLINICAL DATA:  Medial left ankle pain and swelling for 3 months. History of prior subtalar and tibiotalar fusion.  EXAM: CT OF THE LEFT ANKLE WITHOUT CONTRAST TECHNIQUE: Multidetector CT imaging of the left ankle was performed according to the standard protocol. Multiplanar CT image reconstructions were also generated. COMPARISON:  Plain films of the left foot 07/16/2016 and plain films left ankle 11/18/2019. FINDINGS: Bones/Joint/Cartilage The patient has a retrograde nail in place for subtalar and tibiotalar fusion. There is extensive lucency about the nail throughout its length. Marked lucency is also present about a screw in the distal tibia. Two screws in the calcaneus are also in place without surrounding lucency. The hardware is intact. There is mild lateral angulation of the nail. No bridging bone is seen across the tibiotalar or subtalar joints. The nail has mild lateral angulation of approximately 11 degrees at the level of the metaphysis of the tibia. There is lateral hindfoot angulation of 38 degrees. Two screws are in place in the medial malleolus for fracture fixation. The fracture is healed. The distal fibula has been resected. Bridging bone between the distal 1.5 cm of the fibula and tibia is identified. Ligaments Suboptimally assessed by CT. Muscles and Tendons No tendon entrapment.  Tendons appear intact. Soft tissues There is some subcutaneous edema present. No focal fluid collection or mass is identified. IMPRESSION: Status post subtalar and tibiotalar fusion. There is no bridging bone across either joint. Extensive lucency about the intramedullary nail and screw in the distal tibia is consistent with motion and loosening. Hindfoot valgus. Healed medial malleolar fracture with 2 fixation screws in place. Electronically Signed   By: Drusilla Kanner M.D.   On: 12/21/2019 10:37   PERIPHERAL VASCULAR CATHETERIZATION  Result Date: 01/03/2020 Patient name: RASCHELLE WISENBAKER MRN: [id number] DOB: 1951-04-04 Sex: female 01/03/2020 Pre-operative Diagnosis: Bleeding from left upper arm dialysis graft Post-operative  diagnosis:  Same Surgeon:  Durene Cal Procedure Performed:  1.  Ultrasound guided access, left upper arm dialysis graft  2.  Shuntogram  3.  Balloon venoplasty, left axillary vein (peripheral)  4.  Conscious sedation, 25 minutes  Indications: The patient came to the ER today for the second time in 3 days for bleeding from her dialysis graft.  I have recommended shuntogram to evaluate for central stenosis. Procedure:  The patient was identified  in the holding area and taken to room 8.  The patient was then placed supine on the table and prepped and draped in the usual sterile fashion.  A time out was called.  Conscious sedation was administered with the use of IV fentanyl and Versed under continuous physician and nurse monitoring.  Heart rate, blood pressure, and oxygen saturations were continuously monitored.  Total sedation time was 25 minutes ultrasound was used to evaluate the fistula.  The vein was patent and compressible.  A digital ultrasound image was acquired.  The fistula was then accessed under ultrasound guidance using a micropuncture needle.  An 018 wire was then asvanced without resistance and a micropuncture sheath was placed.  Contrast injections were then performed through the sheath. Findings: The central venous system is widely patent.  A stent is visualized at the venous outflow tract.  Just beyond the stent there is a 80% stenosis within the axillary vein.  The arterial venous anastomosis is widely patent.  Intervention: After the above images were acquired the decision was made to proceed with intervention.  A 7 French sheath was inserted over a Bentson wire.  I used a Glidewire to cross the lesion.  I then proceeded with balloon venoplasty using an 8 x 40 Mustang balloon.  Follow-up imaging revealed improved but suboptimal result and so I upsized to a 12 x 40 Mustang balloon.  This was taken to 20 atm.  Follow-up imaging revealed complete resolution of the stenosis.  The access site was  closed with a 4-0 Monocryl. Impression:  #1  Greater than 80% stenosis just beyond the venous outflow stent treated using an 8 mm followed by 12 mm balloon with residual stenosis less than 10%.  Theotis Burrow, M.D., FACS [facility] Office: [phone] Pager:  [phone]  DG Chest Port 1 View  Result Date: 01/10/2020 CLINICAL DATA:  Hypoxia, shortness of breath, COVID-19 positive EXAM: PORTABLE CHEST 1 VIEW COMPARISON:  09/30/2019 FINDINGS: Single frontal view of the chest demonstrates persistent enlargement of the cardiac silhouette. There is chronic interstitial prominence and mild central vascular congestion. No focal airspace disease, effusion, or pneumothorax. No acute bony abnormalities. IMPRESSION: 1. Chronic central vascular congestion and interstitial prominence. No acute airspace disease. Electronically Signed   By: [name] M.D.   On: 01/10/2020 16:10

## 2020-01-16 NOTE — Progress Notes (Signed)
CSW left voicemail for patient's daughter, Judson Roch, regarding SNF request.   Cedric Fishman LCSW

## 2020-01-16 NOTE — Progress Notes (Signed)
Subjective:  No labs today.  Last Hb 10.2 yest.   Patient not seen today directly given COVID-19 + status, utilizing data taken from chart +/- discussions w/ providers and staff.    Objective Vital signs in last 24 hours: Vitals:   01/15/20 2315 01/16/20 0534 01/16/20 0925 01/16/20 1442  BP: 133/65 140/72 125/66 107/60  Pulse: 68 66  63  Resp: 18 17  16   Temp: 97.8 F (36.6 C) 97.6 F (36.4 C)  98 F (36.7 C)  TempSrc: Oral Oral  Oral  SpO2: 99% 99%  100%  Weight:      Height:       Weight change:   Physical Exam:  Patient not examined today directly given COVID-19 + status, utilizing data taken from chart +/- discussions w/ providers and staff.   Outpatient Orders TTS/[place]  3.5h  400/1.5  74.5kg  2/2 bath  Prof 1 Hep none  LUE AVF venofer 100mg  qtreatment mircera 1mcg q4weeks last given 12/01/19   Problem/Plan: 1. AHRF 2/2 acute COVID-19 pneumonitis: steroids, remdesivir per primary service 2. Bleeding access post HD 8/21: not receiving heparin, if re-bleeds again will need fistulogram with IR. Check Hb tomorrow.  3. ESRD on HD TTS. Will maintain tts schedule. Next hd Tuesday.  4. Anemia of chronic disease. HGB 10.4 Avoid iron, on aranesp here 34mcg qweekly first dose 8/19 (OP Mircera q 4 weeks  At dc  Need q 2 weeks ) 5. HTN/volume: bp's soft, and 3kg under dry wt. Keep even w/ next HD tomorrow. 6. DM2, per primary 7. CAD s/p stents x 2. On eliquis, asa, statin, BB, imdur  Kelly Splinter, MD 01/16/2020, 3:40 PM     Labs: Basic Metabolic Panel: Recent Labs  Lab 01/13/20 1633 01/14/20 0827 01/15/20 0430  NA 137 141 136  K 4.4 4.1 3.7  CL 98 98 96*  CO2 27 27 28   GLUCOSE 148* 62* 86  BUN 19 33* 17  CREATININE 3.76* 5.24* 3.73*  CALCIUM 9.1 9.4 8.6*   Liver Function Tests: Recent Labs  Lab 01/13/20 1633 01/14/20 0827 01/15/20 0430  AST 50* 35 35  ALT 21 22 21   ALKPHOS 85 76 74  BILITOT 0.9 1.0 0.7  PROT 5.6* 5.9* 5.6*  ALBUMIN 2.7* 2.9* 2.8*    No results for input(s): LIPASE, AMYLASE in the last 168 hours. No results for input(s): AMMONIA in the last 168 hours. CBC: Recent Labs  Lab 01/11/20 0441 01/11/20 0441 01/12/20 0622 01/12/20 0622 01/13/20 1633 01/14/20 0827 01/15/20 0430  WBC 3.4*   < > 1.8*   < > 3.6* 4.7 4.6  NEUTROABS 2.8   < > 1.3*   < > 2.8 4.0 3.1  HGB 8.9*   < > 9.4*   < > 10.1* 10.4* 10.2*  HCT 29.3*   < > 30.8*   < > 31.5* 33.0* 32.9*  MCV 105.4*  --  104.1*  --  99.7 100.3* 100.6*  PLT 87*   < > 90*   < > 107* 144* 136*   < > = values in this interval not displayed.   Cardiac Enzymes: No results for input(s): CKTOTAL, CKMB, CKMBINDEX, TROPONINI in the last 168 hours. CBG: Recent Labs  Lab 01/15/20 2157 01/15/20 2302 01/16/20 0805 01/16/20 0842 01/16/20 1204  GLUCAP 98 116* 32* 73 105*    Studies/Results: No results found. Medications:   amLODipine  5 mg Oral Daily   apixaban  2.5 mg Oral BID   vitamin C  500 mg Oral Daily   aspirin  81 mg Oral Daily   atorvastatin  20 mg Oral QHS   calcitRIOL  2.5 mcg Oral Q T,Th,Sa-HD   Chlorhexidine Gluconate Cloth  6 each Topical Q0600   darbepoetin (ARANESP) injection - DIALYSIS  40 mcg Intravenous Q Sat-HD   dexamethasone (DECADRON) injection  6 mg Intravenous Q24H   insulin aspart  0-5 Units Subcutaneous QHS   insulin aspart  0-9 Units Subcutaneous TID WC   insulin detemir  12 Units Subcutaneous QHS   isosorbide mononitrate  60 mg Oral Daily   melatonin  10 mg Oral QHS   metoprolol tartrate  100 mg Oral BID   multivitamin  1 tablet Oral QHS   pantoprazole  20 mg Oral BID   zinc sulfate  220 mg Oral Daily

## 2020-01-16 NOTE — NC FL2 (Signed)
[place] LEVEL OF CARE SCREENING TOOL     IDENTIFICATION  [name] Birthdate: 1950-12-02 Sex: female Admission Date (Current Location): 01/10/2020  [facility] [place] and [place] Number:  Engineering geologist and Address:  [facility]. [facility], [place] [address] [place], [place] [address]      Provider Number: [id number]  Attending Physician Name and Address:  Thurnell Lose, MD  Relative Name and Phone Number:  Judson Roch, daughter, [phone]    Current Level of Care: Hospital Recommended Level of Care: [place] Prior Approval Number:    Date Approved/Denied:   PASRR Number: [id number]  Discharge Plan: SNF    Current Diagnoses: Patient Active Problem List   Diagnosis Date Noted  . COVID-19 01/10/2020  . Hypoxia 01/10/2020  . Chest pain 10/01/2019  . History of pulmonary embolism 10/01/2019  . Prolonged QT interval 10/01/2019  . Hypertensive urgency   . Other headache syndrome   . Orthostatic dizziness 04/15/2019  . Bradycardia 04/15/2019  . Syncope   . ESRD on hemodialysis ([place])   . Chest pain at rest 09/16/2018  . Diabetic Charct's arthropathy ([place]) 10/07/2016  . GERD (gastroesophageal reflux disease) 09/09/2016  . Depression 09/09/2016  . Chronic diastolic (congestive) heart failure ([place]) 09/09/2016  . Elevated troponin 09/09/2016  . Hyperlipidemia associated with type 2 diabetes mellitus ([place]) 03/26/2015  . Charcot ankle 03/16/2015  . Anemia, chronic renal failure   . NSVT (nonsustained ventricular tachycardia) ([place])   . Hypertension due to end stage renal disease caused by type 2 diabetes mellitus, on dialysis ([place]) 12/09/2014  . Bipolar affective disorder ([place]) 12/09/2014  . Diabetes mellitus type 2, insulin dependent ([place]) 11/17/2014  . CAD (coronary artery disease), native coronary artery with 2 stents  11/17/2014    Orientation RESPIRATION BLADDER Height & Weight     Self, Place, Situation   Normal Continent Weight: 157 lb 10.1 oz (71.5 kg) Height:  5\' 5"  (165.1 cm)  BEHAVIORAL SYMPTOMS/MOOD NEUROLOGICAL BOWEL NUTRITION STATUS      Continent Diet (Please see DC Summary)  AMBULATORY STATUS COMMUNICATION OF NEEDS Skin   Limited Assist Verbally Other (Comment) (Wound on buttocks)                       Personal Care Assistance Level of Assistance  Bathing, Feeding, Dressing Bathing Assistance: Limited assistance Feeding assistance: Independent Dressing Assistance: Limited assistance     Functional Limitations Info  Sight, Hearing, Speech Sight Info: Adequate Hearing Info: Adequate Speech Info: Adequate    SPECIAL CARE FACTORS FREQUENCY  PT (By licensed PT), OT (By licensed OT)     PT Frequency: 5x/week OT Frequency: 5x/week            Contractures Contractures Info: Not present    Additional Factors Info  Code Status, Allergies, Isolation Precautions, Insulin Sliding Scale Code Status Info: Full Allergies Info: NKA   Insulin Sliding Scale Info: See DC Summary for dose Isolation Precautions Info: COVID+ 8/16 (at the [facility] ED)     Current Medications (01/16/2020):  This is the current hospital active medication list Current Facility-Administered Medications  Medication Dose Route Frequency Provider Last Rate Last Admin  . acetaminophen (TYLENOL) tablet 650 mg  650 mg Oral Q6H PRN Chotiner, Yevonne Aline, MD   650 mg at 01/13/20 2202  . albuterol (VENTOLIN HFA) 108 (90 Base) MCG/ACT inhaler 1-2 puff  1-2 puff Inhalation Q6H PRN Chotiner, Yevonne Aline, MD      .  amLODipine (NORVASC) tablet 5 mg  5 mg Oral Daily Chotiner, Yevonne Aline, MD   5 mg at 01/16/20 0926  . apixaban (ELIQUIS) tablet 2.5 mg  2.5 mg Oral BID Chotiner, Yevonne Aline, MD   2.5 mg at 01/16/20 0926  . ascorbic acid (VITAMIN C) tablet 500 mg  500 mg Oral Daily Chotiner, Yevonne Aline, MD   500 mg at 01/16/20 0926  . aspirin chewable tablet 81 mg  81 mg Oral Daily Chotiner, Yevonne Aline, MD   81 mg at 01/16/20 0926   . atorvastatin (LIPITOR) tablet 20 mg  20 mg Oral QHS Chotiner, Yevonne Aline, MD   20 mg at 01/15/20 2100  . calcitRIOL (ROCALTROL) capsule 2.5 mcg  2.5 mcg Oral Q T,Th,Sa-HD Gean Quint, MD   2.5 mcg at 01/14/20 1549  . Chlorhexidine Gluconate Cloth 2 % PADS 6 each  6 each Topical Q0600 Gean Quint, MD   6 each at 01/16/20 0533  . chlorpheniramine-HYDROcodone (TUSSIONEX) 10-8 MG/5ML suspension 5 mL  5 mL Oral Q12H PRN Chotiner, Yevonne Aline, MD      . Darbepoetin Alfa (ARANESP) injection 40 mcg  40 mcg Intravenous Q Sat-HD Gean Quint, MD      . dexamethasone (DECADRON) injection 6 mg  6 mg Intravenous Q24H Chotiner, Yevonne Aline, MD   6 mg at 01/15/20 2101  . guaiFENesin-dextromethorphan (ROBITUSSIN DM) 100-10 MG/5ML syrup 10 mL  10 mL Oral Q4H PRN Chotiner, Yevonne Aline, MD      . insulin aspart (novoLOG) injection 0-5 Units  0-5 Units Subcutaneous QHS Chotiner, Yevonne Aline, MD   2 Units at 01/11/20 2309  . insulin aspart (novoLOG) injection 0-9 Units  0-9 Units Subcutaneous TID WC Chotiner, Yevonne Aline, MD   2 Units at 01/14/20 1844  . insulin detemir (LEVEMIR) injection 12 Units  12 Units Subcutaneous QHS Thurnell Lose, MD      . isosorbide mononitrate (IMDUR) 24 hr tablet 60 mg  60 mg Oral Daily Chotiner, Yevonne Aline, MD   60 mg at 01/16/20 2683  . loperamide (IMODIUM) capsule 2 mg  2 mg Oral Q6H PRN Thurnell Lose, MD   2 mg at 01/11/20 1707  . melatonin tablet 10 mg  10 mg Oral QHS Chotiner, Yevonne Aline, MD   10 mg at 01/15/20 2100  . metoprolol tartrate (LOPRESSOR) tablet 100 mg  100 mg Oral BID Thurnell Lose, MD   100 mg at 01/16/20 4196  . multivitamin (RENA-VIT) tablet 1 tablet  1 tablet Oral QHS Chotiner, Yevonne Aline, MD   1 tablet at 01/15/20 2100  . ondansetron (ZOFRAN) injection 4 mg  4 mg Intravenous Q8H PRN Thurnell Lose, MD   4 mg at 01/15/20 1425  . pantoprazole (PROTONIX) EC tablet 20 mg  20 mg Oral BID Chotiner, Yevonne Aline, MD   20 mg at 01/16/20 2229  . senna-docusate (Senokot-S)  tablet 1 tablet  1 tablet Oral QHS PRN Chotiner, Yevonne Aline, MD      . zinc sulfate capsule 220 mg  220 mg Oral Daily Chotiner, Yevonne Aline, MD   220 mg at 01/16/20 7989     Discharge Medications: Please see discharge summary for a list of discharge medications.  Relevant Imaging Results:  Relevant Lab Results:   Additional Information SSN: [id number]         Needs transportation to Dialysis at [facility], TTS third shift while [place], LCSW

## 2020-01-16 NOTE — Progress Notes (Signed)
Hypoglycemic Event  CBG: 32  Treatment: 8 oz juice/soda  Symptoms: None  Follow-up CBG: Time: 4599 CBG Result:73  Possible Reasons for Event: Inadequate meal intake  Comments/MD notified:Dr. Candiss Norse notified     Luci Bank

## 2020-01-17 LAB — RENAL FUNCTION PANEL
Albumin: 2.7 g/dL — ABNORMAL LOW (ref 3.5–5.0)
Anion gap: 15 (ref 5–15)
BUN: 40 mg/dL — ABNORMAL HIGH (ref 8–23)
CO2: 26 mmol/L (ref 22–32)
Calcium: 9.2 mg/dL (ref 8.9–10.3)
Chloride: 94 mmol/L — ABNORMAL LOW (ref 98–111)
Creatinine, Ser: 7.53 mg/dL — ABNORMAL HIGH (ref 0.44–1.00)
GFR calc Af Amer: 6 mL/min — ABNORMAL LOW (ref >60)
GFR calc non Af Amer: 5 mL/min — ABNORMAL LOW (ref >60)
Glucose, Bld: 97 mg/dL (ref 70–99)
Phosphorus: 6.6 mg/dL — ABNORMAL HIGH (ref 2.5–4.6)
Potassium: 4.4 mmol/L (ref 3.5–5.1)
Sodium: 135 mmol/L (ref 135–145)

## 2020-01-17 LAB — CBC
HCT: 29.8 % — ABNORMAL LOW (ref 36.0–46.0)
Hemoglobin: 9.5 g/dL — ABNORMAL LOW (ref 12.0–15.0)
MCH: 31.9 pg (ref 26.0–34.0)
MCHC: 31.9 g/dL (ref 30.0–36.0)
MCV: 100 fL (ref 80.0–100.0)
Platelets: 207 10*3/uL (ref 150–400)
RBC: 2.98 MIL/uL — ABNORMAL LOW (ref 3.87–5.11)
RDW: 16.8 % — ABNORMAL HIGH (ref 11.5–15.5)
WBC: 3.9 10*3/uL — ABNORMAL LOW (ref 4.0–10.5)
nRBC: 0 % (ref 0.0–0.2)

## 2020-01-17 LAB — GLUCOSE, CAPILLARY
Glucose-Capillary: 83 mg/dL (ref 70–99)
Glucose-Capillary: 95 mg/dL (ref 70–99)
Glucose-Capillary: 125 mg/dL — ABNORMAL HIGH (ref 70–99)
Glucose-Capillary: 179 mg/dL — ABNORMAL HIGH (ref 70–99)

## 2020-01-17 LAB — HEPATITIS B SURFACE ANTIGEN: Hepatitis B Surface Ag: NONREACTIVE

## 2020-01-17 NOTE — Progress Notes (Signed)
Physical Therapy Treatment Patient Details Name: Robin [name] MRN: [id number] DOB: 03/19/1951 Today's Date: 01/17/2020    History of Present Illness Pt is a 69 yo female presenting via EMS on 01/10/20 with decreased responsiveness and SOB; tested (+) COVID. PMH includes DM, HTN, ESRD (HD TTS), bipolar disorder, dementia.   PT Comments    Pt progressing with mobility. Requires up to minA for mobility, limited by impaired cognition, generalized weakness and decreased activity tolerance, although able to tolerate prolonged activity this session compared to initial evaluation; still at high risk for falls. Pt left with NT for washup. Continue to recommend SNF-level therapies to maximize functional mobility and independence prior to return home.    Follow Up Recommendations  SNF;Supervision/Assistance - 24 hour     Equipment Recommendations  None recommended by PT    Recommendations for Other Services       Precautions / Restrictions Precautions Precautions: Fall Restrictions Weight Bearing Restrictions: No    Mobility  Bed Mobility Overal bed mobility: Needs Assistance Bed Mobility: Supine to Sit     Supine to sit: Supervision;HOB elevated     General bed mobility comments: Extra times to process commands, cues for task  Transfers Overall transfer level: Needs assistance Equipment used: Rolling walker (2 wheeled) Transfers: Sit to/from Stand Sit to Stand: Min assist         General transfer comment: Light minA for stability upon standing from EOB and recliner  Ambulation/Gait Ambulation/Gait assistance: Min guard Gait Distance (Feet): 40 Feet Assistive device: Rolling walker (2 wheeled) Gait Pattern/deviations: Step-through pattern;Decreased stride length Gait velocity: Decreased   General Gait Details: Slow gait throughout room with RW and close min guard for balance; pt easily distracted requiring redirection to task. Left at sink for washup with NT   Stairs              Wheelchair Mobility    Modified Rankin (Stroke Patients Only)       Balance Overall balance assessment: Needs assistance Sitting-balance support: No upper extremity supported;Feet supported Sitting balance-Leahy Scale: Good     Standing balance support: Bilateral upper extremity supported;During functional activity;No upper extremity supported Standing balance-Leahy Scale: Fair Standing balance comment: Can static stand without UE support; dynamic stability improved with RW                            Cognition Arousal/Alertness: Awake/alert Behavior During Therapy: Flat affect Overall Cognitive Status: History of cognitive impairments - at baseline Area of Impairment: Attention;Memory;Following commands;Safety/judgement;Awareness;Problem solving                   Current Attention Level: Sustained;Selective Memory: Decreased short-term memory Following Commands: Follows one step commands with increased time Safety/Judgement: Decreased awareness of safety;Decreased awareness of deficits Awareness: Intellectual Problem Solving: Requires verbal cues General Comments: H/o dementia. Pleasant and cooperative, increased time and cues for following simple commands; easily distracted but able to be redirected well      Exercises      General Comments General comments (skin integrity, edema, etc.): VSS on RA      Pertinent Vitals/Pain Pain Assessment: No/denies pain Pain Intervention(s): Monitored during session    Home Living                      Prior Function            PT Goals (current goals can now be found in the  care plan section) Progress towards PT goals: Progressing toward goals    Frequency    Min 2X/week      PT Plan Current plan remains appropriate    Co-evaluation              AM-PAC PT "6 Clicks" Mobility   Outcome Measure  Help needed turning from your back to your side while in a flat bed  without using bedrails?: A Little Help needed moving from lying on your back to sitting on the side of a flat bed without using bedrails?: A Little Help needed moving to and from a bed to a chair (including a wheelchair)?: A Little Help needed standing up from a chair using your arms (e.g., wheelchair or bedside chair)?: A Little Help needed to walk in hospital room?: A Little Help needed climbing 3-5 steps with a railing? : A Little 6 Click Score: 18    End of Session   Activity Tolerance: Patient tolerated treatment well Patient left:  (seated on BSC at sink for washup with NT; chair alarm set for return to recliner with NT) Nurse Communication: Mobility status PT Visit Diagnosis: Muscle weakness (generalized) (M62.81);Difficulty in walking, not elsewhere classified (R26.2)     Time: 7903-8333 PT Time Calculation (min) (ACUTE ONLY): 24 min  Charges:  $Therapeutic Exercise: 8-22 mins $Therapeutic Activity: 8-22 mins                    Mabeline Caras, PT, DPT Acute Rehabilitation Services  Pager [phone] Office [phone]  Derry Lory 01/17/2020, 4:50 PM

## 2020-01-17 NOTE — Plan of Care (Signed)
  Problem: Education: Goal: Knowledge of risk factors and measures for prevention of condition will improve Outcome: Progressing   Problem: Education: Goal: Knowledge of General Education information will improve Description: Including pain rating scale, medication(s)/side effects and non-pharmacologic comfort measures Outcome: Progressing   

## 2020-01-17 NOTE — Progress Notes (Signed)
Subjective:  Seen on HD, labs okay today, HB 9.5, wbc 3.9. pt in good spirits, says she may be going home.    Objective Vital signs in last 24 hours: Vitals:   01/17/20 1100 01/17/20 1130 01/17/20 1200 01/17/20 1224  BP: (!) 122/51 127/62 (!) 125/47 131/63  Pulse: 61 60 64 61  Resp:    17  Temp:    97.7 F (36.5 C)  TempSrc:    Oral  SpO2:      Weight:      Height:       Weight change:   Physical Exam:   alert, nad   no jvd  Chest cta bilat  Cor reg no RG  Abd soft ntnd no ascites   Ext no LE edema   Alert, NF, ox3   LUE AVF+bruit   Outpatient Orders TTS/[facility]  3.5h  400/1.5  74.5kg  2/2 bath  Prof 1 Hep none  LUE AVF venofer 100mg  qtreatment mircera 67mcg q4weeks last given 12/01/19   Problem/Plan: 1. AHRF 2/2 acute COVID-19 pneumonitis: steroids, remdesivir per primary service 2. Bleeding access post HD 8/21: not receiving heparin, if re-bleeds again will need fistulogram with IR. Hb not sig down.  3. ESRD on HD TTS. HD today on schedule.  4. Anemia of chronic disease. HGB 9- 10's here. Avoid iron, on aranesp here 59mcg qweekly first dose 8/19 (OP Mircera q 4 weeks  At dc  Need q 2 weeks ) 5. HTN/volume: bp's soft, and 3kg under dry wt. Keep even w hd today 6. DM2, per primary 7. CAD s/p stents x 2. On eliquis, asa, statin, BB, imdur 8. Dispo: awaiting a SNF bed  Kelly Splinter, MD 01/16/2020, 3:40 PM     Labs: Basic Metabolic Panel: Recent Labs  Lab 01/14/20 0827 01/15/20 0430 01/17/20 0727  NA 141 136 135  K 4.1 3.7 4.4  CL 98 96* 94*  CO2 27 28 26   GLUCOSE 62* 86 97  BUN 33* 17 40*  CREATININE 5.24* 3.73* 7.53*  CALCIUM 9.4 8.6* 9.2  PHOS  --   --  6.6*   Liver Function Tests: Recent Labs  Lab 01/13/20 1633 01/13/20 1633 01/14/20 0827 01/15/20 0430 01/17/20 0727  AST 50*  --  35 35  --   ALT 21  --  22 21  --   ALKPHOS 85  --  76 74  --   BILITOT 0.9  --  1.0 0.7  --   PROT 5.6*  --  5.9* 5.6*  --   ALBUMIN 2.7*   < > 2.9* 2.8*  2.7*   < > = values in this interval not displayed.   No results for input(s): LIPASE, AMYLASE in the last 168 hours. No results for input(s): AMMONIA in the last 168 hours. CBC: Recent Labs  Lab 01/12/20 0622 01/12/20 0622 01/13/20 1633 01/13/20 1633 01/14/20 0827 01/15/20 0430 01/17/20 0727  WBC 1.8*   < > 3.6*   < > 4.7 4.6 3.9*  NEUTROABS 1.3*   < > 2.8  --  4.0 3.1  --   HGB 9.4*   < > 10.1*   < > 10.4* 10.2* 9.5*  HCT 30.8*   < > 31.5*   < > 33.0* 32.9* 29.8*  MCV 104.1*  --  99.7  --  100.3* 100.6* 100.0  PLT 90*   < > 107*   < > 144* 136* 207   < > = values in this interval not displayed.  Cardiac Enzymes: No results for input(s): CKTOTAL, CKMB, CKMBINDEX, TROPONINI in the last 168 hours. CBG: Recent Labs  Lab 01/16/20 1743 01/16/20 1827 01/16/20 2158 01/17/20 0543 01/17/20 0801  GLUCAP 69* 101* 252* 83 95    Studies/Results: No results found. Medications:  . amLODipine  5 mg Oral Daily  . apixaban  2.5 mg Oral BID  . vitamin C  500 mg Oral Daily  . aspirin  81 mg Oral Daily  . atorvastatin  20 mg Oral QHS  . calcitRIOL  2.5 mcg Oral Q T,Th,Sa-HD  . Chlorhexidine Gluconate Cloth  6 each Topical Q0600  . darbepoetin (ARANESP) injection - DIALYSIS  40 mcg Intravenous Q Sat-HD  . insulin aspart  0-5 Units Subcutaneous QHS  . insulin aspart  0-9 Units Subcutaneous TID WC  . isosorbide mononitrate  60 mg Oral Daily  . melatonin  10 mg Oral QHS  . metoprolol tartrate  100 mg Oral BID  . multivitamin  1 tablet Oral QHS  . pantoprazole  20 mg Oral BID  . zinc sulfate  220 mg Oral Daily

## 2020-01-17 NOTE — Progress Notes (Signed)
PROGRESS NOTE                                                                                                                                                                                                             Patient Demographics:    Robin [name], is a 69 y.o. female, DOB - July 06, 1950, HWT:[id number]  Outpatient Primary [facility] for the patient is [facility]  Admit date - 01/10/2020    CC - SOB     Brief Narrative  - Robin [name] is a 69 y.o. female with medical history significant for diabetes mellitus, hypertension, end-stage renal disease on dialysis Tuesday Thursday Saturday, bipolar disorder, hypertension, dementia presents by EMS with decreased responsiveness and shortness of breath, she was diagnosed with Covid infection at the [place] facility on 01/10/2020 but was sent home as she was doing well.  Patient is quite demented and much of the history was obtained by the admitting physician through family.  She was admitted for the treatment of COVID-19 pneumonia.   Subjective:   Patient in bed, appears comfortable, denies any headache, no fever, no chest pain or pressure, no shortness of breath , no abdominal pain. No focal weakness.   Assessment  & Plan :    1. Acute Hypoxic Resp. Failure due to Acute Covid 19 Viral Pneumonitis during the ongoing 2020 Covid 19 Pandemic - she is unvaccinated, so far appears to have mild to moderate disease, has been started on steroid and remdesivir combination, she will finish her remdesivir course and steroids will be stopped as clinically this problem has resolved.  Encouraged the patient to sit up in chair in the daytime use I-S and flutter valve for pulmonary toiletry and then prone in bed when at night.  Will advance activity and titrate down oxygen as possible.   Recent Labs  Lab 01/10/20 1955 01/10/20 1955 01/10/20 2212 01/11/20 0441 01/11/20 0441  01/11/20 0817 01/12/20 0622 01/13/20 1633 01/14/20 0827 01/15/20 0430 01/17/20 0727  WBC 3.9*   < >  --  3.4*   < >  --  1.8* 3.6* 4.7 4.6 3.9*  CRP 1.5*   < >  --  1.6*  --   --  2.2* 2.0* 1.1* 0.9  --   DDIMER 0.51*   < >  --   --   --  0.48 0.45 0.46 0.33 0.48  --   BNP  --   --   --   --   --  2,572.0* >4,500.0* 3,802.4* >4,500.0* 4,401.9*  --   PROCALCITON  --   --  0.50  --   --   --   --   --   --   --   --   LATICACIDVEN 1.2  --  0.9  --   --   --   --   --   --   --   --   AST 35   < >  --  35  --   --  36 50* 35 35  --   ALT 19   < >  --  16  --   --  $R'19 21 22 21  'Ss$ --   ALKPHOS 72   < >  --  65  --   --  69 85 76 74  --   BILITOT 1.1   < >  --  0.5  --   --  0.9 0.9 1.0 0.7  --   ALBUMIN 3.0*   < >  --  2.6*   < >  --  2.7* 2.7* 2.9* 2.8* 2.7*   < > = values in this interval not displayed.       2.  ESRD.  On TTS schedule.  Nephrology on board.  3.  Dementia.  At risk for delirium.  Supportive care.  4.  CAD.  S/p 2 stents in the past.  Chest pain-free, to new combination of aspirin, statin, beta-blocker, Imdur and Eliquis.  5.  Hypertension.  Currently stable on combination of Norvasc, beta-blocker, Imdur.  Continue and monitor.  6.  Dyslipidemia.  On statin.  7.  Asymptomatic 9 beat NSVT vs Artifact on 8/21 - EF 55%, B Blocker increased, Mag 2.1  8. DM type II. Poor oral intake, with steroids being discontinued will stop Levemir , continue sliding scale and monitor  Lab Results  Component Value Date   HGBA1C 8.1 (H) 01/10/2020   CBG (last 3)  Recent Labs    01/16/20 2158 01/17/20 0543 01/17/20 0801  GLUCAP 252* 83 95      Condition -   Guarded  Family Communication  :  Daughter Judson Roch [phone]  01/11/20 - 7:57 am, message left on 01/12/2020 at 12:30 PM, says left again on 01/16/2020 at 9:42 AM   Code Status :  Full  Consults  :  Renal  Procedures  :  None  PUD Prophylaxis : PPI  Disposition Plan  :    Status is: Inpatient  Remains  inpatient appropriate because:IV treatments appropriate due to intensity of illness or inability to take PO   Dispo: The patient is from: Home              Anticipated d/c is to: SNF              Anticipated d/c date is: 3 days              Patient currently is not medically stable to d/c.   DVT Prophylaxis  :  Eliquis  Lab Results  Component Value Date   PLT 207 01/17/2020    Diet :  Diet Order            Diet renal/carb modified with fluid restriction Diet-HS Snack? Nothing; Fluid restriction: 1200 mL Fluid; Room service appropriate? Yes; Fluid consistency: Thin  Diet  effective now                  Inpatient Medications  Scheduled Meds: . amLODipine  5 mg Oral Daily  . apixaban  2.5 mg Oral BID  . vitamin C  500 mg Oral Daily  . aspirin  81 mg Oral Daily  . atorvastatin  20 mg Oral QHS  . calcitRIOL  2.5 mcg Oral Q T,Th,Sa-HD  . Chlorhexidine Gluconate Cloth  6 each Topical Q0600  . darbepoetin (ARANESP) injection - DIALYSIS  40 mcg Intravenous Q Sat-HD  . insulin aspart  0-5 Units Subcutaneous QHS  . insulin aspart  0-9 Units Subcutaneous TID WC  . isosorbide mononitrate  60 mg Oral Daily  . melatonin  10 mg Oral QHS  . metoprolol tartrate  100 mg Oral BID  . multivitamin  1 tablet Oral QHS  . pantoprazole  20 mg Oral BID  . zinc sulfate  220 mg Oral Daily   Continuous Infusions:  PRN Meds:.acetaminophen, albuterol, chlorpheniramine-HYDROcodone, guaiFENesin-dextromethorphan, loperamide, ondansetron (ZOFRAN) IV, senna-docusate  Antibiotics  :    Anti-infectives (From admission, onward)   Start     Dose/Rate Route Frequency Ordered Stop   01/11/20 1000  remdesivir 100 mg in sodium chloride 0.9 % 100 mL IVPB       "Followed by" Linked Group Details   100 mg 200 mL/hr over 30 Minutes Intravenous Every 24 hours 01/10/20 2129 01/14/20 1918   01/11/20 1000  remdesivir 100 mg in sodium chloride 0.9 % 100 mL IVPB  Status:  Discontinued       "Followed by" Linked  Group Details   100 mg 200 mL/hr over 30 Minutes Intravenous Daily 01/10/20 2321 01/10/20 2339   01/10/20 2321  remdesivir 200 mg in sodium chloride 0.9% 250 mL IVPB  Status:  Discontinued       "Followed by" Linked Group Details   200 mg 580 mL/hr over 30 Minutes Intravenous Once 01/10/20 2321 01/10/20 2339   01/10/20 2200  remdesivir 200 mg in sodium chloride 0.9% 250 mL IVPB       "Followed by" Linked Group Details   200 mg 580 mL/hr over 30 Minutes Intravenous Once 01/10/20 2129 01/11/20 0135       Time Spent in minutes  30   Lala Lund M.D on 01/17/2020 at 11:19 AM  To page go to [email or web address] - password [facility]  [facility] -  Office  [phone]     See all Orders from today for further details    Objective:   Vitals:   01/17/20 0930 01/17/20 1000 01/17/20 1030 01/17/20 1100  BP: 120/69 (!) 119/51 (!) 124/59 (!) 122/51  Pulse: 70 60 62 61  Resp:      Temp:      TempSrc:      SpO2:      Weight:      Height:        Wt Readings from Last 3 Encounters:  01/17/20 70.3 kg  01/03/20 69.9 kg  11/23/19 69.9 kg     Intake/Output Summary (Last 24 hours) at 01/17/2020 1119 Last data filed at 01/17/2020 1100 Gross per 24 hour  Intake 860 ml  Output --  Net 860 ml     Physical Exam  Awake , mildly confused, No new F.N deficits  [place].AT,PERRAL Supple Neck,No JVD, No cervical lymphadenopathy appriciated.  Symmetrical Chest wall movement, Good air movement bilaterally, CTAB RRR,No Gallops, Rubs or new Murmurs, No Parasternal Heave +ve B.Sounds, Abd  Soft, No tenderness, No organomegaly appriciated, No rebound - guarding or rigidity. No Cyanosis, Clubbing or edema, No new Rash or bruise     Data Review:    CBC Recent Labs  Lab 01/11/20 0441 01/11/20 0441 01/12/20 0622 01/13/20 1633 01/14/20 0827 01/15/20 0430 01/17/20 0727  WBC 3.4*   < > 1.8* 3.6* 4.7 4.6 3.9*  HGB 8.9*   < > 9.4* 10.1* 10.4* 10.2* 9.5*  HCT 29.3*   < > 30.8* 31.5* 33.0*  32.9* 29.8*  PLT 87*   < > 90* 107* 144* 136* 207  MCV 105.4*   < > 104.1* 99.7 100.3* 100.6* 100.0  MCH 32.0   < > 31.8 32.0 31.6 31.2 31.9  MCHC 30.4   < > 30.5 32.1 31.5 31.0 31.9  RDW 18.2*   < > 17.9* 17.1* 17.2* 17.2* 16.8*  LYMPHSABS 0.4*  --  0.4* 0.5* 0.5* 0.9  --   MONOABS 0.2  --  0.1 0.3 0.3 0.7  --   EOSABS 0.0  --  0.0 0.0 0.0 0.0  --   BASOSABS 0.0  --  0.0 0.0 0.0 0.0  --    < > = values in this interval not displayed.    Chemistries  Recent Labs  Lab 01/10/20 1605 01/10/20 1955 01/11/20 0441 01/11/20 0441 01/11/20 0817 01/12/20 0622 01/13/20 1633 01/14/20 0827 01/15/20 0430 01/17/20 0727  NA  --    < > 134*   < >  --  136 137 141 136 135  K  --    < > 3.7   < >  --  4.3 4.4 4.1 3.7 4.4  CL  --    < > 97*   < >  --  96* 98 98 96* 94*  CO2  --    < > 23   < >  --  $R'26 27 27 28 26  'Yf$ GLUCOSE  --    < > 146*   < >  --  164* 148* 62* 86 97  BUN  --    < > 38*   < >  --  29* 19 33* 17 40*  CREATININE  --    < > 11.09*   < >  --  7.11* 3.76* 5.24* 3.73* 7.53*  CALCIUM  --    < > 8.3*   < >  --  9.2 9.1 9.4 8.6* 9.2  AST  --    < > 35  --   --  36 50* 35 35  --   ALT  --    < > 16  --   --  $R'19 21 22 21  'FG$ --   ALKPHOS  --    < > 65  --   --  69 85 76 74  --   BILITOT  --    < > 0.5  --   --  0.9 0.9 1.0 0.7  --   MG  --   --   --   --  2.4 2.2 2.1 2.1 1.8  --   HGBA1C 8.1*  --   --   --   --   --   --   --   --   --    < > = values in this interval not displayed.     ------------------------------------------------------------------------------------------------------------------ No results for input(s): CHOL, HDL, LDLCALC, TRIG, CHOLHDL, LDLDIRECT in the last 72 hours.  Lab Results  Component Value Date   HGBA1C  8.1 (H) 01/10/2020   ------------------------------------------------------------------------------------------------------------------ No results for input(s): TSH, T4TOTAL, T3FREE, THYROIDAB in the last 72 hours.  Invalid input(s): FREET3  Cardiac  Enzymes No results for input(s): CKMB, TROPONINI, MYOGLOBIN in the last 168 hours.  Invalid input(s): CK ------------------------------------------------------------------------------------------------------------------    Component Value Date/Time   BNP 4,401.9 (H) 01/15/2020 0430    Micro Results Recent Results (from the past 240 hour(s))  Blood Culture (routine x 2)     Status: None   Collection Time: 01/10/20  4:05 PM   Specimen: BLOOD RIGHT FOREARM  Result Value Ref Range Status   Specimen Description BLOOD RIGHT FOREARM  Final   Special Requests   Final    BOTTLES DRAWN AEROBIC AND ANAEROBIC Blood Culture adequate volume   Culture   Final    NO GROWTH 5 DAYS Performed at [facility], [address] [place], [place] [address]    Report Status 01/15/2020 FINAL  Final  Blood Culture (routine x 2)     Status: None   Collection Time: 01/10/20 10:12 PM   Specimen: BLOOD RIGHT HAND  Result Value Ref Range Status   Specimen Description BLOOD RIGHT HAND  Final   Special Requests   Final    BOTTLES DRAWN AEROBIC AND ANAEROBIC Blood Culture results may not be optimal due to an inadequate volume of blood received in culture bottles   Culture   Final    NO GROWTH 5 DAYS Performed at [facility], [place] [address], [place], [place] [address]    Report Status 01/15/2020 FINAL  Final    Radiology Reports CT HEAD WO CONTRAST  Result Date: 01/08/2020 CLINICAL DATA:  Mental status change EXAM: CT HEAD WITHOUT CONTRAST TECHNIQUE: Contiguous axial images were obtained from the base of the skull through the vertex without intravenous contrast. COMPARISON:  CT head dated 09/30/2019 FINDINGS: Brain: No evidence of acute infarction, hemorrhage, hydrocephalus, extra-axial collection or mass lesion/mass effect. There is mild cerebral volume loss with associated ex vacuo dilatation. Periventricular white matter hypoattenuation likely represents chronic small vessel ischemic disease.  Vascular: There are vascular calcifications in the carotid siphons. Skull: Normal. Negative for fracture or focal lesion. Sinuses/Orbits: No acute finding. Other: None. IMPRESSION: No acute intracranial process. Electronically Signed   By: Zerita Boers M.D.   On: 01/08/2020 20:42   CT ANKLE LEFT WO CONTRAST  Result Date: 12/21/2019 CLINICAL DATA:  Medial left ankle pain and swelling for 3 months. History of prior subtalar and tibiotalar fusion. EXAM: CT OF THE LEFT ANKLE WITHOUT CONTRAST TECHNIQUE: Multidetector CT imaging of the left ankle was performed according to the standard protocol. Multiplanar CT image reconstructions were also generated. COMPARISON:  Plain films of the left foot 07/16/2016 and plain films left ankle 11/18/2019. FINDINGS: Bones/Joint/Cartilage The patient has a retrograde nail in place for subtalar and tibiotalar fusion. There is extensive lucency about the nail throughout its length. Marked lucency is also present about a screw in the distal tibia. Two screws in the calcaneus are also in place without surrounding lucency. The hardware is intact. There is mild lateral angulation of the nail. No bridging bone is seen across the tibiotalar or subtalar joints. The nail has mild lateral angulation of approximately 11 degrees at the level of the metaphysis of the tibia. There is lateral hindfoot angulation of 38 degrees. Two screws are in place in the medial malleolus for fracture fixation. The fracture is healed. The distal fibula has been resected. Bridging bone between the  distal 1.5 cm of the fibula and tibia is identified. Ligaments Suboptimally assessed by CT. Muscles and Tendons No tendon entrapment.  Tendons appear intact. Soft tissues There is some subcutaneous edema present. No focal fluid collection or mass is identified. IMPRESSION: Status post subtalar and tibiotalar fusion. There is no bridging bone across either joint. Extensive lucency about the intramedullary nail and screw  in the distal tibia is consistent with motion and loosening. Hindfoot valgus. Healed medial malleolar fracture with 2 fixation screws in place. Electronically Signed   By: Inge Rise M.D.   On: 12/21/2019 10:37   PERIPHERAL VASCULAR CATHETERIZATION  Result Date: 01/03/2020 Patient name: CUMA POLYAKOV MRN: [id number] DOB: [birth year] Sex: female 01/03/2020 Pre-operative Diagnosis: Bleeding from left upper arm dialysis graft Post-operative diagnosis:  Same Surgeon:  Annamarie Major Procedure Performed:  1.  Ultrasound guided access, left upper arm dialysis graft  2.  Shuntogram  3.  Balloon venoplasty, left axillary vein (peripheral)  4.  Conscious sedation, 25 minutes  Indications: The patient came to the ER today for the second time in 3 days for bleeding from her dialysis graft.  I have recommended shuntogram to evaluate for central stenosis. Procedure:  The patient was identified in the holding area and taken to room 8.  The patient was then placed supine on the table and prepped and draped in the usual sterile fashion.  A time out was called.  Conscious sedation was administered with the use of IV fentanyl and Versed under continuous physician and nurse monitoring.  Heart rate, blood pressure, and oxygen saturations were continuously monitored.  Total sedation time was 25 minutes ultrasound was used to evaluate the fistula.  The vein was patent and compressible.  A digital ultrasound image was acquired.  The fistula was then accessed under ultrasound guidance using a micropuncture needle.  An 018 wire was then asvanced without resistance and a micropuncture sheath was placed.  Contrast injections were then performed through the sheath. Findings: The central venous system is widely patent.  A stent is visualized at the venous outflow tract.  Just beyond the stent there is a 80% stenosis within the axillary vein.  The arterial venous anastomosis is widely patent.  Intervention: After the above images were  acquired the decision was made to proceed with intervention.  A 7 French sheath was inserted over a Bentson wire.  I used a Glidewire to cross the lesion.  I then proceeded with balloon venoplasty using an 8 x 40 Mustang balloon.  Follow-up imaging revealed improved but suboptimal result and so I upsized to a 12 x 40 Mustang balloon.  This was taken to 20 atm.  Follow-up imaging revealed complete resolution of the stenosis.  The access site was closed with a 4-0 Monocryl. Impression:  #1  Greater than 80% stenosis just beyond the venous outflow stent treated using an 8 mm followed by 12 mm balloon with residual stenosis less than 10%.  Theotis Burrow, M.D., FACS [facility] Office: [phone] Pager:  [phone]  DG Chest Port 1 View  Result Date: 01/10/2020 CLINICAL DATA:  Hypoxia, shortness of breath, COVID-19 positive EXAM: PORTABLE CHEST 1 VIEW COMPARISON:  09/30/2019 FINDINGS: Single frontal view of the chest demonstrates persistent enlargement of the cardiac silhouette. There is chronic interstitial prominence and mild central vascular congestion. No focal airspace disease, effusion, or pneumothorax. No acute bony abnormalities. IMPRESSION: 1. Chronic central vascular congestion and interstitial prominence. No acute airspace disease. Electronically Signed  By: [name] M.D.   On: 01/10/2020 16:10

## 2020-01-18 DIAGNOSIS — N186 End stage renal disease: Secondary | ICD-10-CM

## 2020-01-18 DIAGNOSIS — Z794 Long term (current) use of insulin: Secondary | ICD-10-CM

## 2020-01-18 DIAGNOSIS — Z992 Dependence on renal dialysis: Secondary | ICD-10-CM

## 2020-01-18 DIAGNOSIS — E119 Type 2 diabetes mellitus without complications: Secondary | ICD-10-CM

## 2020-01-18 LAB — GLUCOSE, CAPILLARY
Glucose-Capillary: 211 mg/dL — ABNORMAL HIGH (ref 70–99)
Glucose-Capillary: 90 mg/dL (ref 70–99)
Glucose-Capillary: 156 mg/dL — ABNORMAL HIGH (ref 70–99)
Glucose-Capillary: 156 mg/dL — ABNORMAL HIGH (ref 70–99)

## 2020-01-18 NOTE — Progress Notes (Signed)
PROGRESS NOTE                                                                                                                                                                                                             Patient Demographics:    Robin [name], is a 69 y.o. female, DOB - 03-02-1951, TGP:[id number]  [facility] the [facility] is [facility] [place] - [facility]  Admit date - 01/10/2020    CC - SOB     Brief Narrative  - Robin [name] is a 69 y.o. female with medical history significant for diabetes mellitus, hypertension, end-stage renal disease on dialysis Tuesday Thursday Saturday, bipolar disorder, hypertension, dementia presents by EMS with decreased responsiveness and shortness of breath, she was diagnosed with Covid infection at the [place] facility on 01/10/2020 but was sent home as she was doing well.  Patient is quite demented and much of the history was obtained by the admitting physician through family.  She was admitted for the treatment of COVID-19 pneumonia.   Subjective:   Patient in bed, appears comfortable, denies any headache, no fever, no chest pain or pressure, no shortness of breath , no abdominal pain. No focal weakness.   Assessment  & Plan :    Acute Hypoxic Resp. Failure due to Acute Covid 19 Viral Pneumonitis during the ongoing 2020 Covid 19 Pandemic - she is unvaccinated, so far appears to have mild to moderate disease, has been started on steroid and remdesivir combination, he did finish her remdesivir treatment, as well she did finish her steroids treatment   Encouraged the patient to sit up in chair in the daytime use I-S and flutter valve for pulmonary toiletry and then prone in bed when at night.  Will advance activity and titrate down oxygen as possible.   Recent Labs  Lab 01/12/20 0622 01/13/20 1633 01/14/20 0827 01/15/20 0430 01/17/20 0727  WBC 1.8* 3.6* 4.7 4.6 3.9*   CRP 2.2* 2.0* 1.1* 0.9  --   DDIMER 0.45 0.46 0.33 0.48  --   BNP >4,500.0* 3,802.4* >4,500.0* 4,401.9*  --   AST 36 50* 35 35  --   ALT _0 --   ALKPHOS 69 85 76 74  --   BILITOT 0.9 0.9 1.0 0.7  --  ALBUMIN 2.7* 2.7* 2.9* 2.8* 2.7*       2.  ESRD.  On TTS schedule.  Nephrology on board.  3.  Dementia.  At risk for delirium.  Supportive care.  4.  CAD.  S/p 2 stents in the past.  Chest pain-free, to new combination of aspirin, statin, beta-blocker, Imdur and Eliquis.  5.  Hypertension.  Currently stable on combination of Norvasc, beta-blocker, Imdur.  Continue and monitor.  6.  Dyslipidemia.  On statin.  7.  Asymptomatic 9 beat NSVT vs Artifact on 8/21 - EF 55%, B Blocker increased, Mag 2.1  8. DM type II. Poor oral intake, with steroids being discontinued will stop Levemir , continue sliding scale and monitor  Lab Results  Component Value Date   HGBA1C 8.1 (H) 01/10/2020   CBG (last 3)  Recent Labs    01/17/20 2201 01/18/20 0733 01/18/20 1218  GLUCAP 179* 211* 90      Condition -   Guarded  Family Communication  : Tried to reach daughter, unable to leave a voicemail   Code Status :  Full  Consults  :  Renal  Procedures  :  None  PUD Prophylaxis : PPI  Disposition Plan  :    Status is: Inpatient  Remains inpatient appropriate because:Unsafe d/c plan   Dispo: The patient is from: Home              Anticipated d/c is to: SNF              Anticipated d/c date is: 1 day              Patient currently is medically stable to d/c.  Patient is medically stable for discharge, awaiting SNF bed availability   DVT Prophylaxis  :  Eliquis  Lab Results  Component Value Date   PLT 207 01/17/2020    Diet :  Diet Order            Diet renal/carb modified with fluid restriction Diet-HS Snack? Nothing; Fluid restriction: 1200 mL Fluid; Room service appropriate? Yes; Fluid consistency: Thin  Diet effective now                  Inpatient  Medications  Scheduled Meds: . amLODipine  5 mg Oral Daily  . apixaban  2.5 mg Oral BID  . vitamin C  500 mg Oral Daily  . aspirin  81 mg Oral Daily  . atorvastatin  20 mg Oral QHS  . calcitRIOL  2.5 mcg Oral Q T,Th,Sa-HD  . Chlorhexidine Gluconate Cloth  6 each Topical Q0600  . darbepoetin (ARANESP) injection - DIALYSIS  40 mcg Intravenous Q Sat-HD  . insulin aspart  0-5 Units Subcutaneous QHS  . insulin aspart  0-9 Units Subcutaneous TID WC  . isosorbide mononitrate  60 mg Oral Daily  . melatonin  10 mg Oral QHS  . metoprolol tartrate  100 mg Oral BID  . multivitamin  1 tablet Oral QHS  . pantoprazole  20 mg Oral BID  . zinc sulfate  220 mg Oral Daily   Continuous Infusions:  PRN Meds:.acetaminophen, albuterol, chlorpheniramine-HYDROcodone, guaiFENesin-dextromethorphan, loperamide, ondansetron (ZOFRAN) IV, senna-docusate  Antibiotics  :    Anti-infectives (From admission, onward)   Start     Dose/Rate Route Frequency Ordered Stop   01/11/20 1000  remdesivir 100 mg in sodium chloride 0.9 % 100 mL IVPB       "Followed by" Linked Group Details   100 mg 200 mL/hr  over 30 Minutes Intravenous Every 24 hours 01/10/20 2129 01/14/20 1918   01/11/20 1000  remdesivir 100 mg in sodium chloride 0.9 % 100 mL IVPB  Status:  Discontinued       "Followed by" Linked Group Details   100 mg 200 mL/hr over 30 Minutes Intravenous Daily 01/10/20 2321 01/10/20 2339   01/10/20 2321  remdesivir 200 mg in sodium chloride 0.9% 250 mL IVPB  Status:  Discontinued       "Followed by" Linked Group Details   200 mg 580 mL/hr over 30 Minutes Intravenous Once 01/10/20 2321 01/10/20 2339   01/10/20 2200  remdesivir 200 mg in sodium chloride 0.9% 250 mL IVPB       "Followed by" Linked Group Details   200 mg 580 mL/hr over 30 Minutes Intravenous Once 01/10/20 2129 01/11/20 0135        Emeline Gins Keir Foland M.D on 01/18/2020 at 2:13 PM  To page go to [email or web address] -   [facility] -  Office   [phone]     See all Orders from today for further details    Objective:   Vitals:   01/17/20 1224 01/17/20 1645 01/17/20 2050 01/18/20 0505  BP: 131/63 (!) 110/55  (!) 135/49  Pulse: 61 63  74  Resp: _0 Temp: 97.7 F (36.5 C) 97.8 F (36.6 C) 98.3 F (36.8 C) 98.6 F (37 C)  TempSrc: Oral Oral Oral Oral  SpO2:  98%  99%  Weight:      Height:        Wt Readings from Last 3 Encounters:  01/17/20 70.3 kg  01/03/20 69.9 kg  11/23/19 69.9 kg     Intake/Output Summary (Last 24 hours) at 01/18/2020 1413 Last data filed at 01/18/2020 1122 Gross per 24 hour  Intake 720 ml  Output 1 ml  Net 719 ml     Physical Exam  Awake Alert, confused, in no apparent distress Symmetrical Chest wall movement, Good air movement bilaterally, CTAB RRR,No Gallops,Rubs or new Murmurs, No Parasternal Heave +ve B.Sounds, Abd Soft, No tenderness, No rebound - guarding or rigidity. No Cyanosis, Clubbing or edema, No new Rash or bruise       Data Review:    CBC Recent Labs  Lab 01/12/20 0622 01/13/20 1633 01/14/20 0827 01/15/20 0430 01/17/20 0727  WBC 1.8* 3.6* 4.7 4.6 3.9*  HGB 9.4* 10.1* 10.4* 10.2* 9.5*  HCT 30.8* 31.5* 33.0* 32.9* 29.8*  PLT 90* 107* 144* 136* 207  MCV 104.1* 99.7 100.3* 100.6* 100.0  MCH 31.8 32.0 31.6 31.2 31.9  MCHC 30.5 32.1 31.5 31.0 31.9  RDW 17.9* 17.1* 17.2* 17.2* 16.8*  LYMPHSABS 0.4* 0.5* 0.5* 0.9  --   MONOABS 0.1 0.3 0.3 0.7  --   EOSABS 0.0 0.0 0.0 0.0  --   BASOSABS 0.0 0.0 0.0 0.0  --     Chemistries  Recent Labs  Lab 01/12/20 0622 01/13/20 1633 01/14/20 0827 01/15/20 0430 01/17/20 0727  NA 136 137 141 136 135  K 4.3 4.4 4.1 3.7 4.4  CL 96* 98 98 96* 94*  CO2 _1 GLUCOSE 164* 148* 62* 86 97  BUN 29* 19 33* 17 40*  CREATININE 7.11* 3.76* 5.24* 3.73* 7.53*  CALCIUM 9.2 9.1 9.4 8.6* 9.2  AST 36 50* 35 35  --   ALT _2 --   ALKPHOS 69 85 76 74  --   BILITOT 0.9 0.9 1.0 0.7  --  MG 2.2 2.1  2.1 1.8  --      ------------------------------------------------------------------------------------------------------------------ No results for input(s): CHOL, HDL, LDLCALC, TRIG, CHOLHDL, LDLDIRECT in the last 72 hours.  Lab Results  Component Value Date   HGBA1C 8.1 (H) 01/10/2020   ------------------------------------------------------------------------------------------------------------------ No results for input(s): TSH, T4TOTAL, T3FREE, THYROIDAB in the last 72 hours.  Invalid input(s): FREET3  Cardiac Enzymes No results for input(s): CKMB, TROPONINI, MYOGLOBIN in the last 168 hours.  Invalid input(s): CK ------------------------------------------------------------------------------------------------------------------    Component Value Date/Time   BNP 4,401.9 (H) 01/15/2020 0430    Micro Results Recent Results (from the past 240 hour(s))  Blood Culture (routine x 2)     Status: None   Collection Time: 01/10/20  4:05 PM   Specimen: BLOOD RIGHT FOREARM  Result Value Ref Range Status   Specimen Description BLOOD RIGHT FOREARM  Final   Special Requests   Final    BOTTLES DRAWN AEROBIC AND ANAEROBIC Blood Culture adequate volume   Culture   Final    NO GROWTH 5 DAYS Performed at [facility], [address] [place], [place] [address]    Report Status 01/15/2020 FINAL  Final  Blood Culture (routine x 2)     Status: None   Collection Time: 01/10/20 10:12 PM   Specimen: BLOOD RIGHT HAND  Result Value Ref Range Status   Specimen Description BLOOD RIGHT HAND  Final   Special Requests   Final    BOTTLES DRAWN AEROBIC AND ANAEROBIC Blood Culture results may not be optimal due to an inadequate volume of blood received in culture bottles   Culture   Final    NO GROWTH 5 DAYS Performed at [facility], [place] [address], [place], [place] [address]    Report Status 01/15/2020 FINAL  Final    Radiology Reports CT HEAD WO CONTRAST  Result Date:  01/08/2020 CLINICAL DATA:  Mental status change EXAM: CT HEAD WITHOUT CONTRAST TECHNIQUE: Contiguous axial images were obtained from the base of the skull through the vertex without intravenous contrast. COMPARISON:  CT head dated 09/30/2019 FINDINGS: Brain: No evidence of acute infarction, hemorrhage, hydrocephalus, extra-axial collection or mass lesion/mass effect. There is mild cerebral volume loss with associated ex vacuo dilatation. Periventricular white matter hypoattenuation likely represents chronic small vessel ischemic disease. Vascular: There are vascular calcifications in the carotid siphons. Skull: Normal. Negative for fracture or focal lesion. Sinuses/Orbits: No acute finding. Other: None. IMPRESSION: No acute intracranial process. Electronically Signed   By: Zerita Boers M.D.   On: 01/08/2020 20:42   CT ANKLE LEFT WO CONTRAST  Result Date: 12/21/2019 CLINICAL DATA:  Medial left ankle pain and swelling for 3 months. History of prior subtalar and tibiotalar fusion. EXAM: CT OF THE LEFT ANKLE WITHOUT CONTRAST TECHNIQUE: Multidetector CT imaging of the left ankle was performed according to the standard protocol. Multiplanar CT image reconstructions were also generated. COMPARISON:  Plain films of the left foot 07/16/2016 and plain films left ankle 11/18/2019. FINDINGS: Bones/Joint/Cartilage The patient has a retrograde nail in place for subtalar and tibiotalar fusion. There is extensive lucency about the nail throughout its length. Marked lucency is also present about a screw in the distal tibia. Two screws in the calcaneus are also in place without surrounding lucency. The hardware is intact. There is mild lateral angulation of the nail. No bridging bone is seen across the tibiotalar or subtalar joints. The nail has mild lateral angulation of approximately 11 degrees at the level of the  metaphysis of the tibia. There is lateral hindfoot angulation of 38 degrees. Two screws are in place in the medial  malleolus for fracture fixation. The fracture is healed. The distal fibula has been resected. Bridging bone between the distal 1.5 cm of the fibula and tibia is identified. Ligaments Suboptimally assessed by CT. Muscles and Tendons No tendon entrapment.  Tendons appear intact. Soft tissues There is some subcutaneous edema present. No focal fluid collection or mass is identified. IMPRESSION: Status post subtalar and tibiotalar fusion. There is no bridging bone across either joint. Extensive lucency about the intramedullary nail and screw in the distal tibia is consistent with motion and loosening. Hindfoot valgus. Healed medial malleolar fracture with 2 fixation screws in place. Electronically Signed   By: Inge Rise M.D.   On: 12/21/2019 10:37   PERIPHERAL VASCULAR CATHETERIZATION  Result Date: 01/03/2020 Patient name: SHONETTE RHAMES MRN: [id number] DOB: 12-09-1950 Sex: female 01/03/2020 Pre-operative Diagnosis: Bleeding from left upper arm dialysis graft Post-operative diagnosis:  Same Surgeon:  Annamarie Major Procedure Performed:  1.  Ultrasound guided access, left upper arm dialysis graft  2.  Shuntogram  3.  Balloon venoplasty, left axillary vein (peripheral)  4.  Conscious sedation, 25 minutes  Indications: The patient came to the ER today for the second time in 3 days for bleeding from her dialysis graft.  I have recommended shuntogram to evaluate for central stenosis. Procedure:  The patient was identified in the holding area and taken to room 8.  The patient was then placed supine on the table and prepped and draped in the usual sterile fashion.  A time out was called.  Conscious sedation was administered with the use of IV fentanyl and Versed under continuous physician and nurse monitoring.  Heart rate, blood pressure, and oxygen saturations were continuously monitored.  Total sedation time was 25 minutes ultrasound was used to evaluate the fistula.  The vein was patent and compressible.  A digital  ultrasound image was acquired.  The fistula was then accessed under ultrasound guidance using a micropuncture needle.  An 018 wire was then asvanced without resistance and a micropuncture sheath was placed.  Contrast injections were then performed through the sheath. Findings: The central venous system is widely patent.  A stent is visualized at the venous outflow tract.  Just beyond the stent there is a 80% stenosis within the axillary vein.  The arterial venous anastomosis is widely patent.  Intervention: After the above images were acquired the decision was made to proceed with intervention.  A 7 French sheath was inserted over a Bentson wire.  I used a Glidewire to cross the lesion.  I then proceeded with balloon venoplasty using an 8 x 40 Mustang balloon.  Follow-up imaging revealed improved but suboptimal result and so I upsized to a 12 x 40 Mustang balloon.  This was taken to 20 atm.  Follow-up imaging revealed complete resolution of the stenosis.  The access site was closed with a 4-0 Monocryl. Impression:  #1  Greater than 80% stenosis just beyond the venous outflow stent treated using an 8 mm followed by 12 mm balloon with residual stenosis less than 10%.  Theotis Burrow, M.D., FACS [facility] Office: [phone] Pager:  [phone]  DG Chest Port 1 View  Result Date: 01/10/2020 CLINICAL DATA:  Hypoxia, shortness of breath, COVID-19 positive EXAM: PORTABLE CHEST 1 VIEW COMPARISON:  09/30/2019 FINDINGS: Single frontal view of the chest demonstrates persistent enlargement of the cardiac  silhouette. There is chronic interstitial prominence and mild central vascular congestion. No focal airspace disease, effusion, or pneumothorax. No acute bony abnormalities. IMPRESSION: 1. Chronic central vascular congestion and interstitial prominence. No acute airspace disease. Electronically Signed   By: [name] M.D.   On: 01/10/2020 16:10

## 2020-01-18 NOTE — Progress Notes (Signed)
[facility] Progress Note  Subjective:  Patient not examined today directly given COVID-19 + status, utilizing data taken from chart +/- discussions w/ providers and staff.    Vitals:   01/17/20 1224 01/17/20 1645 01/17/20 2050 01/18/20 0505  BP: 131/63 (!) 110/55  (!) 135/49  Pulse: 61 63  74  Resp: 17 19 18 18   Temp: 97.7 F (36.5 C) 97.8 F (36.6 C) 98.3 F (36.8 C) 98.6 F (37 C)  TempSrc: Oral Oral Oral Oral  SpO2:  98%  99%  Weight:      Height:         Physical Exam:  Patient not examined today directly given COVID-19 + status, utilizing data taken from chart +/- discussions w/ providers and staff.     OP HD:  TTS [facility]  3.5h  400/1.5  74.5kg  2/2 bath  Prof 1 Hep none  LUE AVF venofer 100mg  qtreatment mircera 67mcg q4weeks last given 12/01/19   Problem/Plan: 1. AHRF 2/2 acute COVID-19 pneumonitis: steroids, remdesivir per primary service 2. Bleeding access post HD 8/21: not receiving heparin, if re-bleeds again will need fistulogram with IR. Hb not sig down.  3. ESRD on HD TTS. HD tomorrow.   4. Anemia of chronic disease: HGB 9- 10's here. Avoid iron, on aranesp here 45mcg qweekly first dose 8/19 (OP Mircera q 4 weeks  At dc  Need q 2 weeks) 5. HTN/volume: bp's soft, and under dry. Keep even w hd today 6. DM2, per primary 7. CAD s/p stents x 2. On eliquis, asa, statin, BB, imdur 8. Dispo: awaiting a SNF bed   Rob Zakkiyya Barno 01/18/2020, 2:03 PM   Recent Labs  Lab 01/15/20 0430 01/17/20 0727  K 3.7 4.4  BUN 17 40*  CREATININE 3.73* 7.53*  CALCIUM 8.6* 9.2  PHOS  --  6.6*  HGB 10.2* 9.5*   Inpatient medications: . amLODipine  5 mg Oral Daily  . apixaban  2.5 mg Oral BID  . vitamin C  500 mg Oral Daily  . aspirin  81 mg Oral Daily  . atorvastatin  20 mg Oral QHS  . calcitRIOL  2.5 mcg Oral Q T,Th,Sa-HD  . Chlorhexidine Gluconate Cloth  6 each Topical Q0600  . darbepoetin (ARANESP) injection - DIALYSIS  40 mcg Intravenous Q Sat-HD   . insulin aspart  0-5 Units Subcutaneous QHS  . insulin aspart  0-9 Units Subcutaneous TID WC  . isosorbide mononitrate  60 mg Oral Daily  . melatonin  10 mg Oral QHS  . metoprolol tartrate  100 mg Oral BID  . multivitamin  1 tablet Oral QHS  . pantoprazole  20 mg Oral BID  . zinc sulfate  220 mg Oral Daily    acetaminophen, albuterol, chlorpheniramine-HYDROcodone, guaiFENesin-dextromethorphan, loperamide, ondansetron (ZOFRAN) IV, senna-docusate

## 2020-01-18 NOTE — Progress Notes (Signed)
No IV access; patient pulled off multiple times her IV lines; per IV team, they will come and find another site only if the patient will need IV medicine.

## 2020-01-19 LAB — RENAL FUNCTION PANEL
Albumin: 2.7 g/dL — ABNORMAL LOW (ref 3.5–5.0)
Anion gap: 14 (ref 5–15)
BUN: 33 mg/dL — ABNORMAL HIGH (ref 8–23)
CO2: 26 mmol/L (ref 22–32)
Calcium: 9 mg/dL (ref 8.9–10.3)
Chloride: 95 mmol/L — ABNORMAL LOW (ref 98–111)
Creatinine, Ser: 6.43 mg/dL — ABNORMAL HIGH (ref 0.44–1.00)
GFR calc Af Amer: 7 mL/min — ABNORMAL LOW (ref >60)
GFR calc non Af Amer: 6 mL/min — ABNORMAL LOW (ref >60)
Glucose, Bld: 115 mg/dL — ABNORMAL HIGH (ref 70–99)
Phosphorus: 5.1 mg/dL — ABNORMAL HIGH (ref 2.5–4.6)
Potassium: 4.1 mmol/L (ref 3.5–5.1)
Sodium: 135 mmol/L (ref 135–145)

## 2020-01-19 LAB — GLUCOSE, CAPILLARY
Glucose-Capillary: 125 mg/dL — ABNORMAL HIGH (ref 70–99)
Glucose-Capillary: 98 mg/dL (ref 70–99)
Glucose-Capillary: 68 mg/dL — ABNORMAL LOW (ref 70–99)
Glucose-Capillary: 48 mg/dL — ABNORMAL LOW (ref 70–99)
Glucose-Capillary: 98 mg/dL (ref 70–99)
Glucose-Capillary: 169 mg/dL — ABNORMAL HIGH (ref 70–99)
Glucose-Capillary: 210 mg/dL — ABNORMAL HIGH (ref 70–99)

## 2020-01-19 LAB — CBC
HCT: 27.2 % — ABNORMAL LOW (ref 36.0–46.0)
Hemoglobin: 8.5 g/dL — ABNORMAL LOW (ref 12.0–15.0)
MCH: 31.5 pg (ref 26.0–34.0)
MCHC: 31.3 g/dL (ref 30.0–36.0)
MCV: 100.7 fL — ABNORMAL HIGH (ref 80.0–100.0)
Platelets: 287 10*3/uL (ref 150–400)
RBC: 2.7 MIL/uL — ABNORMAL LOW (ref 3.87–5.11)
RDW: 17.2 % — ABNORMAL HIGH (ref 11.5–15.5)
WBC: 5.6 10*3/uL (ref 4.0–10.5)
nRBC: 0 % (ref 0.0–0.2)

## 2020-01-19 NOTE — Progress Notes (Signed)
PROGRESS NOTE                                                                                                                                                                                                             Patient Demographics:    Robin [name], is a 69 y.o. female, DOB - 09/20/1950, URK:[id number]  Outpatient Primary MD for the [facility] is [facility]    LOS - 9  Admit date - 01/10/2020    CC - SOB     Brief Narrative  - Robin [name] is a 69 y.o. female with medical history significant for diabetes mellitus, hypertension, end-stage renal disease on dialysis Tuesday Thursday Saturday, bipolar disorder, hypertension, dementia presents by EMS with decreased responsiveness and shortness of breath, she was diagnosed with Covid infection at the [place] facility on 01/10/2020 but was sent home as she was doing well.  Patient is quite demented and much of the history was obtained by the admitting physician through family.  She was admitted for the treatment of COVID-19 pneumonia.   Subjective:   Patient in bed, appears comfortable, denies any headache, no fever, no chest pain or pressure, no shortness of breath , no abdominal pain. No focal weakness.   Assessment  & Plan :    Acute Hypoxic Resp. Failure due to Acute Covid 19 Viral Pneumonitis during the ongoing 2020 Covid 19 Pandemic -she is unvaccinated, this has significantly improved, she have mild to moderate disease, she was treated with steroids and remdesivir, she did finish her treatment course.  Encouraged the patient to sit up in chair in the daytime use I-S and flutter valve for pulmonary toiletry and then prone in bed when at night.  Will advance activity and titrate down oxygen as possible.   Recent Labs  Lab 01/13/20 1633 01/14/20 0827 01/15/20 0430 01/17/20 0727 01/19/20 1130  WBC 3.6* 4.7 4.6 3.9* 5.6  CRP 2.0* 1.1* 0.9  --   --   DDIMER 0.46  0.33 0.48  --   --   BNP 3,802.4* >4,500.0* 4,401.9*  --   --   AST 50* 35 35  --   --   ALT $Re'21 22 21  'LMf$ --   --   ALKPHOS 85 76 74  --   --   BILITOT 0.9 1.0 0.7  --   --  ALBUMIN 2.7* 2.9* 2.8* 2.7* 2.7*       ESRD.  On TTS schedule.  Nephrology on board.  Dementia.  At risk for delirium.  Supportive care.  CAD.  S/p 2 stents in the past.  Chest pain-free, to new combination of aspirin, statin, beta-blocker, Imdur and Eliquis.  Hypertension.  Currently stable on combination of Norvasc, beta-blocker, Imdur.  Continue and monitor.  Dyslipidemia.  On statin.  Asymptomatic 9 beat NSVT vs Artifact on 8/21 - EF 55%, B Blocker increased, Mag 2.1  DM type II.  CBGs are currently controlled only on sliding scale, there has been stopped .  Lab Results  Component Value Date   HGBA1C 8.1 (H) 01/10/2020   CBG (last 3)  Recent Labs    01/18/20 2113 01/19/20 0815 01/19/20 1130  GLUCAP 156* 125* 98      Condition -   Guarded  Family Communication  : D/W daughter by phone.  Code Status :  Full  Consults  :  Renal  Procedures  :  None  PUD Prophylaxis : PPI  Disposition Plan  :    Status is: Inpatient  Remains inpatient appropriate because:Unsafe d/c plan   Dispo: The patient is from: Home              Anticipated d/c is to: SNF              Anticipated d/c date is: 1 day              Patient currently is medically stable to d/c.  Patient is medically stable for discharge, awaiting SNF bed availability   DVT Prophylaxis  :  Eliquis  Lab Results  Component Value Date   PLT 287 01/19/2020    Diet :  Diet Order            Diet renal/carb modified with fluid restriction Diet-HS Snack? Nothing; Fluid restriction: 1200 mL Fluid; Room service appropriate? Yes; Fluid consistency: Thin  Diet effective now                  Inpatient Medications  Scheduled Meds: . amLODipine  5 mg Oral Daily  . apixaban  2.5 mg Oral BID  . vitamin C  500 mg Oral Daily  .  aspirin  81 mg Oral Daily  . atorvastatin  20 mg Oral QHS  . calcitRIOL  2.5 mcg Oral Q T,Th,Sa-HD  . Chlorhexidine Gluconate Cloth  6 each Topical Q0600  . darbepoetin (ARANESP) injection - DIALYSIS  40 mcg Intravenous Q Sat-HD  . insulin aspart  0-5 Units Subcutaneous QHS  . insulin aspart  0-9 Units Subcutaneous TID WC  . isosorbide mononitrate  60 mg Oral Daily  . melatonin  10 mg Oral QHS  . metoprolol tartrate  100 mg Oral BID  . multivitamin  1 tablet Oral QHS  . pantoprazole  20 mg Oral BID  . zinc sulfate  220 mg Oral Daily   Continuous Infusions:  PRN Meds:.acetaminophen, albuterol, chlorpheniramine-HYDROcodone, guaiFENesin-dextromethorphan, loperamide, ondansetron (ZOFRAN) IV, senna-docusate  Antibiotics  :    Anti-infectives (From admission, onward)   Start     Dose/Rate Route Frequency Ordered Stop   01/11/20 1000  remdesivir 100 mg in sodium chloride 0.9 % 100 mL IVPB       "Followed by" Linked Group Details   100 mg 200 mL/hr over 30 Minutes Intravenous Every 24 hours 01/10/20 2129 01/14/20 1918   01/11/20 1000  remdesivir 100 mg in sodium  chloride 0.9 % 100 mL IVPB  Status:  Discontinued       "Followed by" Linked Group Details   100 mg 200 mL/hr over 30 Minutes Intravenous Daily 01/10/20 2321 01/10/20 2339   01/10/20 2321  remdesivir 200 mg in sodium chloride 0.9% 250 mL IVPB  Status:  Discontinued       "Followed by" Linked Group Details   200 mg 580 mL/hr over 30 Minutes Intravenous Once 01/10/20 2321 01/10/20 2339   01/10/20 2200  remdesivir 200 mg in sodium chloride 0.9% 250 mL IVPB       "Followed by" Linked Group Details   200 mg 580 mL/hr over 30 Minutes Intravenous Once 01/10/20 2129 01/11/20 0135        Emeline Gins Analeia Ismael M.D on 01/19/2020 at 12:59 PM  To page go to [email or web address] -   [facility] -  Office  [phone]     See all Orders from today for further details    Objective:   Vitals:   01/18/20 0505 01/18/20 1449 01/18/20  2115 01/19/20 0514  BP: (!) 135/49 (!) 125/59 123/60 125/61  Pulse: 74 63 64 65  Resp: $Remo'18 20 17 18  'GLsEw$ Temp: 98.6 F (37 C) 98.1 F (36.7 C) 98.1 F (36.7 C) 98 F (36.7 C)  TempSrc: Oral Oral Oral Oral  SpO2: 99% 95% 100% 97%  Weight:      Height:        Wt Readings from Last 3 Encounters:  01/17/20 70.3 kg  01/03/20 69.9 kg  11/23/19 69.9 kg     Intake/Output Summary (Last 24 hours) at 01/19/2020 1259 Last data filed at 01/19/2020 1139 Gross per 24 hour  Intake 480 ml  Output 1 ml  Net 479 ml     Physical Exam  Awake Alert, Oriented X 1, confused, demented. Symmetrical Chest wall movement, Good air movement bilaterally, CTAB RRR,No Gallops,Rubs or new Murmurs, No Parasternal Heave +ve B.Sounds, Abd Soft, No tenderness, No rebound - guarding or rigidity. No Cyanosis, Clubbing or edema, No new Rash or bruise        Data Review:    CBC Recent Labs  Lab 01/13/20 1633 01/14/20 0827 01/15/20 0430 01/17/20 0727 01/19/20 1130  WBC 3.6* 4.7 4.6 3.9* 5.6  HGB 10.1* 10.4* 10.2* 9.5* 8.5*  HCT 31.5* 33.0* 32.9* 29.8* 27.2*  PLT 107* 144* 136* 207 287  MCV 99.7 100.3* 100.6* 100.0 100.7*  MCH 32.0 31.6 31.2 31.9 31.5  MCHC 32.1 31.5 31.0 31.9 31.3  RDW 17.1* 17.2* 17.2* 16.8* 17.2*  LYMPHSABS 0.5* 0.5* 0.9  --   --   MONOABS 0.3 0.3 0.7  --   --   EOSABS 0.0 0.0 0.0  --   --   BASOSABS 0.0 0.0 0.0  --   --     Chemistries  Recent Labs  Lab 01/13/20 1633 01/14/20 0827 01/15/20 0430 01/17/20 0727 01/19/20 1130  NA 137 141 136 135 135  K 4.4 4.1 3.7 4.4 4.1  CL 98 98 96* 94* 95*  CO2 $Re'27 27 28 26 26  'pDa$ GLUCOSE 148* 62* 86 97 115*  BUN 19 33* 17 40* 33*  CREATININE 3.76* 5.24* 3.73* 7.53* 6.43*  CALCIUM 9.1 9.4 8.6* 9.2 9.0  AST 50* 35 35  --   --   ALT $Re'21 22 21  'tUF$ --   --   ALKPHOS 85 76 74  --   --   BILITOT 0.9 1.0 0.7  --   --  MG 2.1 2.1 1.8  --   --       ------------------------------------------------------------------------------------------------------------------ No results for input(s): CHOL, HDL, LDLCALC, TRIG, CHOLHDL, LDLDIRECT in the last 72 hours.  Lab Results  Component Value Date   HGBA1C 8.1 (H) 01/10/2020   ------------------------------------------------------------------------------------------------------------------ No results for input(s): TSH, T4TOTAL, T3FREE, THYROIDAB in the last 72 hours.  Invalid input(s): FREET3  Cardiac Enzymes No results for input(s): CKMB, TROPONINI, MYOGLOBIN in the last 168 hours.  Invalid input(s): CK ------------------------------------------------------------------------------------------------------------------    Component Value Date/Time   BNP 4,401.9 (H) 01/15/2020 0430    Micro Results Recent Results (from the past 240 hour(s))  Blood Culture (routine x 2)     Status: None   Collection Time: 01/10/20  4:05 PM   Specimen: BLOOD RIGHT FOREARM  Result Value Ref Range Status   Specimen Description BLOOD RIGHT FOREARM  Final   Special Requests   Final    BOTTLES DRAWN AEROBIC AND ANAEROBIC Blood Culture adequate volume   Culture   Final    NO GROWTH 5 DAYS Performed at [facility], [address] [place], [place] [address]    Report Status 01/15/2020 FINAL  Final  Blood Culture (routine x 2)     Status: None   Collection Time: 01/10/20 10:12 PM   Specimen: BLOOD RIGHT HAND  Result Value Ref Range Status   Specimen Description BLOOD RIGHT HAND  Final   Special Requests   Final    BOTTLES DRAWN AEROBIC AND ANAEROBIC Blood Culture results may not be optimal due to an inadequate volume of blood received in culture bottles   Culture   Final    NO GROWTH 5 DAYS Performed at [facility], [place] [address], [place], [place] [address]    Report Status 01/15/2020 FINAL  Final    Radiology Reports CT HEAD WO CONTRAST  Result Date: 01/08/2020 CLINICAL DATA:   Mental status change EXAM: CT HEAD WITHOUT CONTRAST TECHNIQUE: Contiguous axial images were obtained from the base of the skull through the vertex without intravenous contrast. COMPARISON:  CT head dated 09/30/2019 FINDINGS: Brain: No evidence of acute infarction, hemorrhage, hydrocephalus, extra-axial collection or mass lesion/mass effect. There is mild cerebral volume loss with associated ex vacuo dilatation. Periventricular white matter hypoattenuation likely represents chronic small vessel ischemic disease. Vascular: There are vascular calcifications in the carotid siphons. Skull: Normal. Negative for fracture or focal lesion. Sinuses/Orbits: No acute finding. Other: None. IMPRESSION: No acute intracranial process. Electronically Signed   By: Zerita Boers M.D.   On: 01/08/2020 20:42   CT ANKLE LEFT WO CONTRAST  Result Date: 12/21/2019 CLINICAL DATA:  Medial left ankle pain and swelling for 3 months. History of prior subtalar and tibiotalar fusion. EXAM: CT OF THE LEFT ANKLE WITHOUT CONTRAST TECHNIQUE: Multidetector CT imaging of the left ankle was performed according to the standard protocol. Multiplanar CT image reconstructions were also generated. COMPARISON:  Plain films of the left foot 07/16/2016 and plain films left ankle 11/18/2019. FINDINGS: Bones/Joint/Cartilage The patient has a retrograde nail in place for subtalar and tibiotalar fusion. There is extensive lucency about the nail throughout its length. Marked lucency is also present about a screw in the distal tibia. Two screws in the calcaneus are also in place without surrounding lucency. The hardware is intact. There is mild lateral angulation of the nail. No bridging bone is seen across the tibiotalar or subtalar joints. The nail has mild lateral angulation of approximately 11 degrees at the level  of the metaphysis of the tibia. There is lateral hindfoot angulation of 38 degrees. Two screws are in place in the medial malleolus for fracture  fixation. The fracture is healed. The distal fibula has been resected. Bridging bone between the distal 1.5 cm of the fibula and tibia is identified. Ligaments Suboptimally assessed by CT. Muscles and Tendons No tendon entrapment.  Tendons appear intact. Soft tissues There is some subcutaneous edema present. No focal fluid collection or mass is identified. IMPRESSION: Status post subtalar and tibiotalar fusion. There is no bridging bone across either joint. Extensive lucency about the intramedullary nail and screw in the distal tibia is consistent with motion and loosening. Hindfoot valgus. Healed medial malleolar fracture with 2 fixation screws in place. Electronically Signed   By: Inge Rise M.D.   On: 12/21/2019 10:37   PERIPHERAL VASCULAR CATHETERIZATION  Result Date: 01/03/2020 Patient name: SHARENA DIBENEDETTO MRN: [id number] DOB: 1950/05/31 Sex: female 01/03/2020 Pre-operative Diagnosis: Bleeding from left upper arm dialysis graft Post-operative diagnosis:  Same Surgeon:  Annamarie Major Procedure Performed:  1.  Ultrasound guided access, left upper arm dialysis graft  2.  Shuntogram  3.  Balloon venoplasty, left axillary vein (peripheral)  4.  Conscious sedation, 25 minutes  Indications: The patient came to the ER today for the second time in 3 days for bleeding from her dialysis graft.  I have recommended shuntogram to evaluate for central stenosis. Procedure:  The patient was identified in the holding area and taken to room 8.  The patient was then placed supine on the table and prepped and draped in the usual sterile fashion.  A time out was called.  Conscious sedation was administered with the use of IV fentanyl and Versed under continuous physician and nurse monitoring.  Heart rate, blood pressure, and oxygen saturations were continuously monitored.  Total sedation time was 25 minutes ultrasound was used to evaluate the fistula.  The vein was patent and compressible.  A digital ultrasound image was  acquired.  The fistula was then accessed under ultrasound guidance using a micropuncture needle.  An 018 wire was then asvanced without resistance and a micropuncture sheath was placed.  Contrast injections were then performed through the sheath. Findings: The central venous system is widely patent.  A stent is visualized at the venous outflow tract.  Just beyond the stent there is a 80% stenosis within the axillary vein.  The arterial venous anastomosis is widely patent.  Intervention: After the above images were acquired the decision was made to proceed with intervention.  A 7 French sheath was inserted over a Bentson wire.  I used a Glidewire to cross the lesion.  I then proceeded with balloon venoplasty using an 8 x 40 Mustang balloon.  Follow-up imaging revealed improved but suboptimal result and so I upsized to a 12 x 40 Mustang balloon.  This was taken to 20 atm.  Follow-up imaging revealed complete resolution of the stenosis.  The access site was closed with a 4-0 Monocryl. Impression:  #1  Greater than 80% stenosis just beyond the venous outflow stent treated using an 8 mm followed by 12 mm balloon with residual stenosis less than 10%.  Theotis Burrow, M.D., FACS [facility] Office: [phone] Pager:  [phone]  DG Chest Port 1 View  Result Date: 01/10/2020 CLINICAL DATA:  Hypoxia, shortness of breath, COVID-19 positive EXAM: PORTABLE CHEST 1 VIEW COMPARISON:  09/30/2019 FINDINGS: Single frontal view of the chest demonstrates persistent enlargement of  the cardiac silhouette. There is chronic interstitial prominence and mild central vascular congestion. No focal airspace disease, effusion, or pneumothorax. No acute bony abnormalities. IMPRESSION: 1. Chronic central vascular congestion and interstitial prominence. No acute airspace disease. Electronically Signed   By: [name] M.D.   On: 01/10/2020 16:10

## 2020-01-19 NOTE — Progress Notes (Signed)
[facility] Progress Note  Subjective:  "when am I going home?" no sob , cough or fevers   Vitals:   01/19/20 1424 01/19/20 1430 01/19/20 1500 01/19/20 1530  BP: 135/70 137/72 113/72 135/65  Pulse: 69 71 67 (!) 53  Resp: 18     Temp:      TempSrc:      SpO2:   100%   Weight:      Height:         Physical Exam:  alert, nad , no oxygen  no jvd  Chest cta bilat  Cor reg no RG  Abd soft ntnd no ascites   Ext no LE edema   Alert, NF, ox3  L AVF+ b    OP HD:  TTS [place]  3.5h  400/1.5  74.5kg  2/2 bath  Prof 1 Hep none  LUE AVF venofer 100mg  qtreatment mircera 62mcg q4weeks last given 12/01/19   Problem/Plan: 1. AHRF 2/2 acute COVID-19 pneumonitis: steroids, remdesivir per primary service 2. Bleeding access post HD 8/21: not receiving heparin, if re-bleeds again will need fistulogram with IR. Hb down 8.5.  3. ESRD on HD TTS. HD today.  4. Anemia of chronic disease: HGB 9- 10's here. Avoid iron, on aranesp here 65mcg qweekly first dose 8/19 (OP Mircera q 4 weeks  At dc  Need q 2 weeks) 5. HTN/volume: 3kg under, keep even w/ hd today 6. DM2, per primary 7. CAD s/p stents x 2. On eliquis, asa, statin, BB, imdur 8. Dispo: awaiting a SNF bed   Robin [name] 01/19/2020, 3:46 PM   Recent Labs  Lab 01/17/20 0727 01/19/20 1130  K 4.4 4.1  BUN 40* 33*  CREATININE 7.53* 6.43*  CALCIUM 9.2 9.0  PHOS 6.6* 5.1*  HGB 9.5* 8.5*   Inpatient medications: . amLODipine  5 mg Oral Daily  . apixaban  2.5 mg Oral BID  . vitamin C  500 mg Oral Daily  . aspirin  81 mg Oral Daily  . atorvastatin  20 mg Oral QHS  . calcitRIOL  2.5 mcg Oral Q T,Th,Sa-HD  . Chlorhexidine Gluconate Cloth  6 each Topical Q0600  . darbepoetin (ARANESP) injection - DIALYSIS  40 mcg Intravenous Q Sat-HD  . insulin aspart  0-5 Units Subcutaneous QHS  . insulin aspart  0-9 Units Subcutaneous TID WC  . isosorbide mononitrate  60 mg Oral Daily  . melatonin  10 mg Oral QHS  . metoprolol  tartrate  100 mg Oral BID  . multivitamin  1 tablet Oral QHS  . pantoprazole  20 mg Oral BID  . zinc sulfate  220 mg Oral Daily    acetaminophen, albuterol, chlorpheniramine-HYDROcodone, guaiFENesin-dextromethorphan, loperamide, ondansetron (ZOFRAN) IV, senna-docusate

## 2020-01-19 NOTE — Progress Notes (Signed)
CSW still unable to reach patient's daughter. Left voicemail. Still no COVID accepting SNF bed offers for dialysis transport.  Gunnison Chahal LCSW

## 2020-01-19 NOTE — Progress Notes (Signed)
Patient back from dialysis. Alert and oriented x 1; no acute distress noted, no complaints. VS stable.

## 2020-01-19 NOTE — Progress Notes (Signed)
Patient went to dialysis.

## 2020-01-19 NOTE — Progress Notes (Signed)
Renal Navigator received call from [place] over [facility] and [place], who states patient can treat in isolation at [facility] in [place] as of this Saturday if the plan is to discharge home or to a SNF in [place]. Navigator left message for CSW/N. Rayyan and will continue to follow.   Alphonzo Cruise, [place] Renal Navigator [phone]

## 2020-01-19 NOTE — Progress Notes (Addendum)
Patient back from dialysis; CBG 68. Patient asymptomatic; because no IV access, patient received 240 ml Apple Juice; rechecked CBG was 48. IV team was consulted for a new IV access. Patient received Ensure 200 ml and additional 240 ml Apple Juice - CBG came up to 98. MD was notified and he asked patient to have an IV access. Also, MD ordered CBG Q 2 hrs. IV was notified again to start a new IV site. Will continue to monitor.

## 2020-01-20 LAB — GLUCOSE, CAPILLARY
Glucose-Capillary: 112 mg/dL — ABNORMAL HIGH (ref 70–99)
Glucose-Capillary: 117 mg/dL — ABNORMAL HIGH (ref 70–99)
Glucose-Capillary: 141 mg/dL — ABNORMAL HIGH (ref 70–99)
Glucose-Capillary: 178 mg/dL — ABNORMAL HIGH (ref 70–99)
Glucose-Capillary: 201 mg/dL — ABNORMAL HIGH (ref 70–99)
Glucose-Capillary: 203 mg/dL — ABNORMAL HIGH (ref 70–99)
Glucose-Capillary: 234 mg/dL — ABNORMAL HIGH (ref 70–99)
Glucose-Capillary: 250 mg/dL — ABNORMAL HIGH (ref 70–99)

## 2020-01-20 MED ORDER — INSULIN ASPART 100 UNIT/ML ~~LOC~~ SOLN
0.0000 [IU] | Freq: Three times a day (TID) | SUBCUTANEOUS | Status: DC
  Administered 2020-01-27: 1 [IU] via SUBCUTANEOUS
  Administered 2020-01-29: 2 [IU] via SUBCUTANEOUS
  Administered 2020-01-30 – 2020-01-31 (×2): 1 [IU] via SUBCUTANEOUS

## 2020-01-20 NOTE — Progress Notes (Signed)
[facility] Progress Note  Subjective:  Patient not seen today directly given COVID-19 + status, utilizing data taken from chart +/- discussions w/ providers and staff.     Vitals:   01/19/20 1817 01/19/20 2115 01/20/20 0515 01/20/20 0820  BP: 108/68 90/80 129/90 110/62  Pulse: 65 65 61 65  Resp: 17 18 18 20   Temp: 97.8 F (36.6 C) 98.2 F (36.8 C) 98.5 F (36.9 C) 98.1 F (36.7 C)  TempSrc: Oral Oral Oral Oral  SpO2: 99% 99% 99% 95%  Weight:      Height:         Physical Exam:  Patient not examined today directly given COVID-19 + status, utilizing data taken from chart +/- discussions w/ providers and staff.      OP HD:  TTS [place]  3.5h  400/1.5  74.5kg  2/2 bath  Prof 1 Hep none  LUE AVF venofer 100mg  qtreatment mircera 49mcg q4weeks last given 12/01/19   Problem/Plan: 1. AHRF 2/2 acute COVID-19 pneumonitis: steroids, remdesivir per primary service 2. ESRD: on HD TTS. HD tomorrow. Min UF.  3. Anemia of chronic disease: HGB 9- 10's here. Avoid iron, on aranesp here 32mcg qweekly first dose 8/19 4. HTN/volume: 3-4 kg under, stable exam and BP, not eating much, 0-1 L uf on hd sat 5. DM2, per primary 6. CAD s/p stents x 2. On eliquis, asa, statin, BB, imdur 7. Dispo: awaiting a SNF bed   Robin [name] 01/20/2020, 11:07 AM   Recent Labs  Lab 01/17/20 0727 01/19/20 1130  K 4.4 4.1  BUN 40* 33*  CREATININE 7.53* 6.43*  CALCIUM 9.2 9.0  PHOS 6.6* 5.1*  HGB 9.5* 8.5*   Inpatient medications: . amLODipine  5 mg Oral Daily  . apixaban  2.5 mg Oral BID  . vitamin C  500 mg Oral Daily  . aspirin  81 mg Oral Daily  . atorvastatin  20 mg Oral QHS  . calcitRIOL  2.5 mcg Oral Q T,Th,Sa-HD  . Chlorhexidine Gluconate Cloth  6 each Topical Q0600  . darbepoetin (ARANESP) injection - DIALYSIS  40 mcg Intravenous Q Sat-HD  . isosorbide mononitrate  60 mg Oral Daily  . melatonin  10 mg Oral QHS  . metoprolol tartrate  100 mg Oral BID  . multivitamin   1 tablet Oral QHS  . pantoprazole  20 mg Oral BID  . zinc sulfate  220 mg Oral Daily    acetaminophen, albuterol, chlorpheniramine-HYDROcodone, guaiFENesin-dextromethorphan, loperamide, ondansetron (ZOFRAN) IV, senna-docusate

## 2020-01-20 NOTE — Progress Notes (Signed)
PROGRESS NOTE                                                                                                                                                                                                             Patient Demographics:    Robin [name], is a 69 y.o. female, DOB - Feb 08, 1951, TSV:[id number]  Outpatient Primary MD for the patient is [facility] [place]    LOS - 10  Admit date - 01/10/2020    CC - SOB     Brief Narrative  - Robin [name] is a 69 y.o. female with medical history significant for diabetes mellitus, hypertension, end-stage renal disease on dialysis Tuesday Thursday Saturday, bipolar disorder, hypertension, dementia presents by EMS with decreased responsiveness and shortness of breath, she was diagnosed with Covid infection at the [place] facility on 01/10/2020 but was sent home as she was doing well.  Patient is quite demented and much of the history was obtained by the admitting physician through family.  She was admitted for the treatment of COVID-19 pneumonia.   Subjective:   Patient in bed, appears comfortable, denies any headache, no fever, no chest pain or pressure, no shortness of breath , no abdominal pain. No focal weakness.   Assessment  & Plan :    Acute Hypoxic Resp. Failure due to Acute Covid 19 Viral Pneumonitis during the ongoing 2020 Covid 19 Pandemic -she is unvaccinated, this has significantly improved, she have mild to moderate disease, she was treated with steroids and remdesivir, she did finish her treatment course. She is currently on room air.  Encouraged the patient to sit up in chair in the daytime use I-S and flutter valve for pulmonary toiletry and then prone in bed when at night.  Will advance activity and titrate down oxygen as possible.   Recent Labs  Lab 01/13/20 1633 01/14/20 0827 01/15/20 0430 01/17/20 0727 01/19/20 1130  WBC 3.6* 4.7 4.6 3.9* 5.6  CRP 2.0*  1.1* 0.9  --   --   DDIMER 0.46 0.33 0.48  --   --   BNP 3,802.4* >4,500.0* 4,401.9*  --   --   AST 50* 35 35  --   --   ALT _0 --   --   ALKPHOS 85 76 74  --   --  BILITOT 0.9 1.0 0.7  --   --   ALBUMIN 2.7* 2.9* 2.8* 2.7* 2.7*       ESRD.  On TTS schedule.  Nephrology on board.  Dementia.  At risk for delirium.  Supportive care.  CAD.  S/p 2 stents in the past.  Chest pain-free, to new combination of aspirin, statin, beta-blocker, Imdur and Eliquis.  Hypertension.  Currently stable on combination of Norvasc, beta-blocker, Imdur.  Continue and monitor.  Dyslipidemia.  On statin.  Asymptomatic 9 beat NSVT vs Artifact on 8/21 - EF 55%, B Blocker increased, Mag 2.1  DM type II. She is with an episode of hypoglycemia yesterday, I have stopped her sliding scale, now is trending up, will resume at sensitive.  Lab Results  Component Value Date   HGBA1C 8.1 (H) 01/10/2020   CBG (last 3)  Recent Labs    01/20/20 0416 01/20/20 0557 01/20/20 1153  GLUCAP 203* 178* 201*      Condition -   Guarded  Family Communication  : D/W daughter by phone 8/26.  Code Status :  Full  Consults  :  Renal  Procedures  :  None  PUD Prophylaxis : PPI  Disposition Plan  :    Status is: Inpatient  Remains inpatient appropriate because:Unsafe d/c plan   Dispo: The patient is from: Home              Anticipated d/c is to: SNF              Anticipated d/c date is: 1 day              Patient currently is medically stable to d/c.  Patient is medically stable for discharge, awaiting SNF bed availability   DVT Prophylaxis  :  Eliquis  Lab Results  Component Value Date   PLT 287 01/19/2020    Diet :  Diet Order            Diet renal/carb modified with fluid restriction Diet-HS Snack? Nothing; Fluid restriction: 1200 mL Fluid; Room service appropriate? Yes; Fluid consistency: Thin  Diet effective now                  Inpatient Medications  Scheduled Meds: .  amLODipine  5 mg Oral Daily  . apixaban  2.5 mg Oral BID  . vitamin C  500 mg Oral Daily  . aspirin  81 mg Oral Daily  . atorvastatin  20 mg Oral QHS  . calcitRIOL  2.5 mcg Oral Q T,Th,Sa-HD  . Chlorhexidine Gluconate Cloth  6 each Topical Q0600  . darbepoetin (ARANESP) injection - DIALYSIS  40 mcg Intravenous Q Sat-HD  . isosorbide mononitrate  60 mg Oral Daily  . melatonin  10 mg Oral QHS  . metoprolol tartrate  100 mg Oral BID  . multivitamin  1 tablet Oral QHS  . pantoprazole  20 mg Oral BID  . zinc sulfate  220 mg Oral Daily   Continuous Infusions:  PRN Meds:.acetaminophen, albuterol, chlorpheniramine-HYDROcodone, guaiFENesin-dextromethorphan, loperamide, ondansetron (ZOFRAN) IV, senna-docusate  Antibiotics  :    Anti-infectives (From admission, onward)   Start     Dose/Rate Route Frequency Ordered Stop   01/11/20 1000  remdesivir 100 mg in sodium chloride 0.9 % 100 mL IVPB       "Followed by" Linked Group Details   100 mg 200 mL/hr over 30 Minutes Intravenous Every 24 hours 01/10/20 2129 01/14/20 1918   01/11/20 1000  remdesivir 100 mg in  sodium chloride 0.9 % 100 mL IVPB  Status:  Discontinued       "Followed by" Linked Group Details   100 mg 200 mL/hr over 30 Minutes Intravenous Daily 01/10/20 2321 01/10/20 2339   01/10/20 2321  remdesivir 200 mg in sodium chloride 0.9% 250 mL IVPB  Status:  Discontinued       "Followed by" Linked Group Details   200 mg 580 mL/hr over 30 Minutes Intravenous Once 01/10/20 2321 01/10/20 2339   01/10/20 2200  remdesivir 200 mg in sodium chloride 0.9% 250 mL IVPB       "Followed by" Linked Group Details   200 mg 580 mL/hr over 30 Minutes Intravenous Once 01/10/20 2129 01/11/20 0135        Emeline Gins Eniya Cannady M.D on 01/20/2020 at 2:27 PM  To page go to [email or web address] -   [facility] -  Office  [phone]     See all Orders from today for further details    Objective:   Vitals:   01/19/20 1817 01/19/20 2115 01/20/20  0515 01/20/20 0820  BP: 108/68 90/80 129/90 110/62  Pulse: 65 65 61 65  Resp: _0 Temp: 97.8 F (36.6 C) 98.2 F (36.8 C) 98.5 F (36.9 C) 98.1 F (36.7 C)  TempSrc: Oral Oral Oral Oral  SpO2: 99% 99% 99% 95%  Weight:      Height:        Wt Readings from Last 3 Encounters:  01/19/20 70.4 kg  01/03/20 69.9 kg  11/23/19 69.9 kg     Intake/Output Summary (Last 24 hours) at 01/20/2020 1427 Last data filed at 01/19/2020 1725 Gross per 24 hour  Intake --  Output 0 ml  Net 0 ml     Physical Exam  Awake Alert, Oriented X1, demented Symmetrical Chest wall movement, Good air movement bilaterally, CTAB RRR,No Gallops,Rubs or new Murmurs, No Parasternal Heave +ve B.Sounds, Abd Soft, No tenderness, No rebound - guarding or rigidity. No Cyanosis, Clubbing or edema, No new Rash or bruise       Data Review:    CBC Recent Labs  Lab 01/13/20 1633 01/14/20 0827 01/15/20 0430 01/17/20 0727 01/19/20 1130  WBC 3.6* 4.7 4.6 3.9* 5.6  HGB 10.1* 10.4* 10.2* 9.5* 8.5*  HCT 31.5* 33.0* 32.9* 29.8* 27.2*  PLT 107* 144* 136* 207 287  MCV 99.7 100.3* 100.6* 100.0 100.7*  MCH 32.0 31.6 31.2 31.9 31.5  MCHC 32.1 31.5 31.0 31.9 31.3  RDW 17.1* 17.2* 17.2* 16.8* 17.2*  LYMPHSABS 0.5* 0.5* 0.9  --   --   MONOABS 0.3 0.3 0.7  --   --   EOSABS 0.0 0.0 0.0  --   --   BASOSABS 0.0 0.0 0.0  --   --     Chemistries  Recent Labs  Lab 01/13/20 1633 01/14/20 0827 01/15/20 0430 01/17/20 0727 01/19/20 1130  NA 137 141 136 135 135  K 4.4 4.1 3.7 4.4 4.1  CL 98 98 96* 94* 95*  CO2 _1 GLUCOSE 148* 62* 86 97 115*  BUN 19 33* 17 40* 33*  CREATININE 3.76* 5.24* 3.73* 7.53* 6.43*  CALCIUM 9.1 9.4 8.6* 9.2 9.0  AST 50* 35 35  --   --   ALT _2 --   --   ALKPHOS 85 76 74  --   --   BILITOT 0.9 1.0 0.7  --   --   MG 2.1 2.1 1.8  --   --      ------------------------------------------------------------------------------------------------------------------  No  results for input(s): CHOL, HDL, LDLCALC, TRIG, CHOLHDL, LDLDIRECT in the last 72 hours.  Lab Results  Component Value Date   HGBA1C 8.1 (H) 01/10/2020   ------------------------------------------------------------------------------------------------------------------ No results for input(s): TSH, T4TOTAL, T3FREE, THYROIDAB in the last 72 hours.  Invalid input(s): FREET3  Cardiac Enzymes No results for input(s): CKMB, TROPONINI, MYOGLOBIN in the last 168 hours.  Invalid input(s): CK ------------------------------------------------------------------------------------------------------------------    Component Value Date/Time   BNP 4,401.9 (H) 01/15/2020 0430    Micro Results Recent Results (from the past 240 hour(s))  Blood Culture (routine x 2)     Status: None   Collection Time: 01/10/20  4:05 PM   Specimen: BLOOD RIGHT FOREARM  Result Value Ref Range Status   Specimen Description BLOOD RIGHT FOREARM  Final   Special Requests   Final    BOTTLES DRAWN AEROBIC AND ANAEROBIC Blood Culture adequate volume   Culture   Final    NO GROWTH 5 DAYS Performed at [facility], [address] [place], [place] [address]    Report Status 01/15/2020 FINAL  Final  Blood Culture (routine x 2)     Status: None   Collection Time: 01/10/20 10:12 PM   Specimen: BLOOD RIGHT HAND  Result Value Ref Range Status   Specimen Description BLOOD RIGHT HAND  Final   Special Requests   Final    BOTTLES DRAWN AEROBIC AND ANAEROBIC Blood Culture results may not be optimal due to an inadequate volume of blood received in culture bottles   Culture   Final    NO GROWTH 5 DAYS Performed at [facility], [address], [place], [place] [address]    Report Status 01/15/2020 FINAL  Final    Radiology Reports CT HEAD WO CONTRAST  Result Date: 01/08/2020 CLINICAL DATA:  Mental status change EXAM: CT HEAD WITHOUT CONTRAST TECHNIQUE: Contiguous axial images were obtained from the base of the  skull through the vertex without intravenous contrast. COMPARISON:  CT head dated 09/30/2019 FINDINGS: Brain: No evidence of acute infarction, hemorrhage, hydrocephalus, extra-axial collection or mass lesion/mass effect. There is mild cerebral volume loss with associated ex vacuo dilatation. Periventricular white matter hypoattenuation likely represents chronic small vessel ischemic disease. Vascular: There are vascular calcifications in the carotid siphons. Skull: Normal. Negative for fracture or focal lesion. Sinuses/Orbits: No acute finding. Other: None. IMPRESSION: No acute intracranial process. Electronically Signed   By: Zerita Boers M.D.   On: 01/08/2020 20:42   PERIPHERAL VASCULAR CATHETERIZATION  Result Date: 01/03/2020 Patient name: AUSET FRITZLER MRN: [id number] DOB: 10-18-1950 Sex: female 01/03/2020 Pre-operative Diagnosis: Bleeding from left upper arm dialysis graft Post-operative diagnosis:  Same Surgeon:  Annamarie Major Procedure Performed:  1.  Ultrasound guided access, left upper arm dialysis graft  2.  Shuntogram  3.  Balloon venoplasty, left axillary vein (peripheral)  4.  Conscious sedation, 25 minutes  Indications: The patient came to the ER today for the second time in 3 days for bleeding from her dialysis graft.  I have recommended shuntogram to evaluate for central stenosis. Procedure:  The patient was identified in the holding area and taken to room 8.  The patient was then placed supine on the table and prepped and draped in the usual sterile fashion.  A time out was called.  Conscious sedation was administered with the use of IV fentanyl and Versed under continuous physician and nurse monitoring.  Heart rate, blood pressure, and oxygen saturations were continuously monitored.  Total  sedation time was 25 minutes ultrasound was used to evaluate the fistula.  The vein was patent and compressible.  A digital ultrasound image was acquired.  The fistula was then accessed under ultrasound guidance  using a micropuncture needle.  An 018 wire was then asvanced without resistance and a micropuncture sheath was placed.  Contrast injections were then performed through the sheath. Findings: The central venous system is widely patent.  A stent is visualized at the venous outflow tract.  Just beyond the stent there is a 80% stenosis within the axillary vein.  The arterial venous anastomosis is widely patent.  Intervention: After the above images were acquired the decision was made to proceed with intervention.  A 7 French sheath was inserted over a Bentson wire.  I used a Glidewire to cross the lesion.  I then proceeded with balloon venoplasty using an 8 x 40 Mustang balloon.  Follow-up imaging revealed improved but suboptimal result and so I upsized to a 12 x 40 Mustang balloon.  This was taken to 20 atm.  Follow-up imaging revealed complete resolution of the stenosis.  The access site was closed with a 4-0 Monocryl. Impression:  #1  Greater than 80% stenosis just beyond the venous outflow stent treated using an 8 mm followed by 12 mm balloon with residual stenosis less than 10%.  Theotis Burrow, M.D., FACS [facility] Office: [phone] Pager:  [phone]  DG Chest Port 1 View  Result Date: 01/10/2020 CLINICAL DATA:  Hypoxia, shortness of breath, COVID-19 positive EXAM: PORTABLE CHEST 1 VIEW COMPARISON:  09/30/2019 FINDINGS: Single frontal view of the chest demonstrates persistent enlargement of the cardiac silhouette. There is chronic interstitial prominence and mild central vascular congestion. No focal airspace disease, effusion, or pneumothorax. No acute bony abnormalities. IMPRESSION: 1. Chronic central vascular congestion and interstitial prominence. No acute airspace disease. Electronically Signed   By: [name] M.D.   On: 01/10/2020 16:10

## 2020-01-20 NOTE — Progress Notes (Signed)
Occupational Therapy Treatment Patient Details Name: Robin [name] MRN: [id number] DOB: [birth year] Today's Date: 01/20/2020    History of present illness Pt is a 69 yo female presenting via EMS on 01/10/20 with decreased responsiveness and SOB; tested (+) COVID. PMH includes DM, HTN, ESRD (HD TTS), bipolar disorder, dementia.   OT comments  Pt seen for OT follow up session with focus on BADL mobility progression and cognition. Pt completed sit <> stands with min A level with RW. Attempted use of SPC with pt (which is baseline), but continued to remain unsteady and reaching for objects. She then completed functional mobility with RW to sink for x2 standing grooming tasks. Pt requires significant increased time to initiate and sequence tasks. Max multimodal cues needed to initiate LB dressing and standing grooming this session. Pt remains unaware as to how deficits may impact her safety. D/c recs remain appropriate for SNF, will continue to follow.   Follow Up Recommendations  SNF;Supervision/Assistance - 24 hour    Equipment Recommendations  None recommended by OT    Recommendations for Other Services      Precautions / Restrictions Precautions Precautions: Fall Restrictions Weight Bearing Restrictions: No       Mobility Bed Mobility               General bed mobility comments: up in chair, returned to chair  Transfers Overall transfer level: Needs assistance Equipment used: Rolling walker (2 wheeled) Transfers: Sit to/from Stand Sit to Stand: Min assist         General transfer comment: assist to rise and steady for safety. Originally attempted use of SPC (pts baseline) but pt continued to reach for objects outside of BOS to steady self. this improved greatly with RW usage    Balance Overall balance assessment: Needs assistance Sitting-balance support: No upper extremity supported;Feet supported Sitting balance-Leahy Scale: Good     Standing balance support:  Bilateral upper extremity supported;During functional activity;No upper extremity supported Standing balance-Leahy Scale: Fair Standing balance comment: Can static stand without UE support; dynamic stability improved with RW                           ADL either performed or assessed with clinical judgement   ADL Overall ADL's : Needs assistance/impaired     Grooming: Standing;Min guard;Cueing for sequencing;Cueing for safety Grooming Details (indicate cue type and reason): standing at sink to wash face and apply her headband to hair. Pt requires max multimodal cues to problem solve basic tasks. Very slow initiation time             Lower Body Dressing: Minimal assistance;Sit to/from stand Lower Body Dressing Details (indicate cue type and reason): pt donned socks using figure 4 method seated in recliner with extreme increased time needed to inititate and sequence task. Pt sat with sock in hand for ~5 minutes despite verbal cues from therapist before finishing donning process. Min A needed to maintain balance in standing Toilet Transfer: Ambulation;Regular Toilet;RW;Minimal assistance Toilet Transfer Details (indicate cue type and reason): assist to power up to stand, close guard for safety         Functional mobility during ADLs: Min guard;Rolling walker;Cueing for safety;Cueing for sequencing General ADL Comments: increased time needed for problem solving     Vision Patient Visual Report: No change from baseline     Perception     Praxis      Cognition Arousal/Alertness: Awake/alert Behavior During Therapy:  Flat affect Overall Cognitive Status: History of cognitive impairments - at baseline Area of Impairment: Attention                   Current Attention Level: Sustained Memory: Decreased short-term memory Following Commands: Follows one step commands with increased time Safety/Judgement: Decreased awareness of safety;Decreased awareness of  deficits Awareness: Intellectual Problem Solving: Slow processing;Decreased initiation;Requires verbal cues;Difficulty sequencing;Requires tactile cues General Comments: significant increased time needed to initiate tasks, as well as multimodal cueing. Pt with minimal verbal output and very flat affect. Easily distracted by lines and other object, requiring redirection to task        Exercises     Shoulder Instructions       General Comments      Pertinent Vitals/ Pain       Pain Assessment: No/denies pain  Home Living                                          Prior Functioning/Environment              Frequency  Min 2X/week        Progress Toward Goals  OT Goals(current goals can now be found in the care plan section)  Progress towards OT goals: Progressing toward goals  Acute Rehab OT Goals OT Goal Formulation: Patient unable to participate in goal setting Time For Goal Achievement: 01/27/20 Potential to Achieve Goals: Good  Plan Discharge plan remains appropriate    Co-evaluation                 AM-PAC OT "6 Clicks" Daily Activity     Outcome Measure   Help from another person eating meals?: A Little Help from another person taking care of personal grooming?: A Little Help from another person toileting, which includes using toliet, bedpan, or urinal?: A Little Help from another person bathing (including washing, rinsing, drying)?: A Little Help from another person to put on and taking off regular upper body clothing?: A Little Help from another person to put on and taking off regular lower body clothing?: A Little 6 Click Score: 18    End of Session Equipment Utilized During Treatment: Rolling walker  OT Visit Diagnosis: Unsteadiness on feet (R26.81);Other abnormalities of gait and mobility (R26.89);Muscle weakness (generalized) (M62.81);History of falling (Z91.81);Other symptoms and signs involving cognitive function    Activity Tolerance Patient tolerated treatment well   Patient Left in chair;with call bell/phone within reach   Nurse Communication Mobility status        Time: 2919-1660 OT Time Calculation (min): 36 min  Charges: OT General Charges $OT Visit: 1 Visit OT Treatments $Self Care/Home Management : 23-37 mins  Zenovia Jarred, MSOT, OTR/L [facility] Office Number: [phone] Pager: [phone]  Zenovia Jarred 01/20/2020, 1:45 PM

## 2020-01-21 LAB — CBC
HCT: 28.6 % — ABNORMAL LOW (ref 36.0–46.0)
Hemoglobin: 8.9 g/dL — ABNORMAL LOW (ref 12.0–15.0)
MCH: 31.6 pg (ref 26.0–34.0)
MCHC: 31.1 g/dL (ref 30.0–36.0)
MCV: 101.4 fL — ABNORMAL HIGH (ref 80.0–100.0)
Platelets: 289 10*3/uL (ref 150–400)
RBC: 2.82 MIL/uL — ABNORMAL LOW (ref 3.87–5.11)
RDW: 16.8 % — ABNORMAL HIGH (ref 11.5–15.5)
WBC: 5 10*3/uL (ref 4.0–10.5)
nRBC: 0 % (ref 0.0–0.2)

## 2020-01-21 LAB — RENAL FUNCTION PANEL
Albumin: 2.9 g/dL — ABNORMAL LOW (ref 3.5–5.0)
Anion gap: 12 (ref 5–15)
BUN: 14 mg/dL (ref 8–23)
CO2: 29 mmol/L (ref 22–32)
Calcium: 9.7 mg/dL (ref 8.9–10.3)
Chloride: 96 mmol/L — ABNORMAL LOW (ref 98–111)
Creatinine, Ser: 5.14 mg/dL — ABNORMAL HIGH (ref 0.44–1.00)
GFR calc Af Amer: 9 mL/min — ABNORMAL LOW (ref >60)
GFR calc non Af Amer: 8 mL/min — ABNORMAL LOW (ref >60)
Glucose, Bld: 113 mg/dL — ABNORMAL HIGH (ref 70–99)
Phosphorus: 4.1 mg/dL (ref 2.5–4.6)
Potassium: 3.9 mmol/L (ref 3.5–5.1)
Sodium: 137 mmol/L (ref 135–145)

## 2020-01-21 LAB — GLUCOSE, CAPILLARY
Glucose-Capillary: 98 mg/dL (ref 70–99)
Glucose-Capillary: 99 mg/dL (ref 70–99)
Glucose-Capillary: 58 mg/dL — ABNORMAL LOW (ref 70–99)
Glucose-Capillary: 62 mg/dL — ABNORMAL LOW (ref 70–99)
Glucose-Capillary: 86 mg/dL (ref 70–99)
Glucose-Capillary: 128 mg/dL — ABNORMAL HIGH (ref 70–99)

## 2020-01-21 MED ORDER — DARBEPOETIN ALFA 40 MCG/0.4ML IJ SOSY
PREFILLED_SYRINGE | INTRAMUSCULAR | Status: AC
  Administered 2020-01-21: 40 ug via INTRAVENOUS
  Filled 2020-01-21: qty 0.4

## 2020-01-21 NOTE — Progress Notes (Signed)
PROGRESS NOTE                                                                                                                                                                                                             Patient Demographics:    Robin [name], is a 69 y.o. female, DOB - 10-05-1950, XIH:[id number]  Outpatient Primary [facility] for the [facility] is [facility]    LOS - 11  Admit date - 01/10/2020    CC - SOB     Brief Narrative  - Robin [name] is a 69 y.o. female with medical history significant for diabetes mellitus, hypertension, end-stage renal disease on dialysis Tuesday Thursday Saturday, bipolar disorder, hypertension, dementia presents by EMS with decreased responsiveness and shortness of breath, she was diagnosed with Covid infection at the [place] facility on 01/10/2020 but was sent home as she was doing well.  Patient is quite demented and much of the history was obtained by the admitting physician through family.  She was admitted for the treatment of COVID-19 pneumonia.   Subjective:   Patient in bed, appears comfortable, denies any headache, no fever, no chills.    Assessment  & Plan :    Acute Hypoxic Resp. Failure due to Acute Covid 19 Viral Pneumonitis during the ongoing 2020 Covid 19 Pandemic -she is unvaccinated, this has significantly improved, she have mild to moderate disease, she was treated with steroids and remdesivir, she did finish her treatment course. She is currently on room air.  Encouraged the patient to sit up in chair in the daytime use I-S and flutter valve for pulmonary toiletry and then prone in bed when at night.  Will advance activity and titrate down oxygen as possible.   Recent Labs  Lab 01/15/20 0430 01/17/20 0727 01/19/20 1130 01/21/20 0119  WBC 4.6 3.9* 5.6 5.0  CRP 0.9  --   --   --   DDIMER 0.48  --   --   --   BNP 4,401.9*  --   --   --   AST 35  --   --   --    ALT 21  --   --   --   ALKPHOS 74  --   --   --   BILITOT 0.7  --   --   --  ALBUMIN 2.8* 2.7* 2.7* 2.9*       ESRD.  On TTS schedule.  Nephrology on board.  Dementia.  At risk for delirium.  Supportive care.  CAD.  S/p 2 stents in the past.  Chest pain-free, to new combination of aspirin, statin, beta-blocker, Imdur and Eliquis.  Hypertension.  Currently stable on combination of Norvasc, beta-blocker, Imdur.  Continue and monitor.  Dyslipidemia.  On statin.  Asymptomatic 9 beat NSVT vs Artifact on 8/21 - EF 55%, B Blocker increased, Mag 2.1  DM type II. She is with an episode of hypoglycemia yesterday, I have stopped her sliding scale, now is trending up, will resume at sensitive.  Lab Results  Component Value Date   HGBA1C 8.1 (H) 01/10/2020   CBG (last 3)  Recent Labs    01/20/20 2327 01/21/20 0802 01/21/20 1218  GLUCAP 117* 98 99      Condition -   Guarded  Family Communication  : D/W daughter by phone 8/26.  Code Status :  Full  Consults  :  Renal  Procedures  :  None  PUD Prophylaxis : PPI  Disposition Plan  :    Status is: Inpatient  Remains inpatient appropriate because:Unsafe d/c plan   Dispo: The patient is from: Home              Anticipated d/c is to: SNF              Anticipated d/c date is: 1 day              Patient currently is medically stable to d/c.  Patient is medically stable for discharge, awaiting SNF bed availability   DVT Prophylaxis  :  Eliquis  Lab Results  Component Value Date   PLT 289 01/21/2020    Diet :  Diet Order            Diet renal/carb modified with fluid restriction Diet-HS Snack? Nothing; Fluid restriction: 1200 mL Fluid; Room service appropriate? Yes; Fluid consistency: Thin  Diet effective now                  Inpatient Medications  Scheduled Meds: . amLODipine  5 mg Oral Daily  . apixaban  2.5 mg Oral BID  . vitamin C  500 mg Oral Daily  . aspirin  81 mg Oral Daily  . atorvastatin  20 mg  Oral QHS  . calcitRIOL  2.5 mcg Oral Q T,Th,Sa-HD  . Chlorhexidine Gluconate Cloth  6 each Topical Q0600  . darbepoetin (ARANESP) injection - DIALYSIS  40 mcg Intravenous Q Sat-HD  . insulin aspart  0-6 Units Subcutaneous TID WC  . isosorbide mononitrate  60 mg Oral Daily  . melatonin  10 mg Oral QHS  . metoprolol tartrate  100 mg Oral BID  . multivitamin  1 tablet Oral QHS  . pantoprazole  20 mg Oral BID  . zinc sulfate  220 mg Oral Daily   Continuous Infusions:  PRN Meds:.acetaminophen, albuterol, chlorpheniramine-HYDROcodone, guaiFENesin-dextromethorphan, loperamide, ondansetron (ZOFRAN) IV, senna-docusate  Antibiotics  :    Anti-infectives (From admission, onward)   Start     Dose/Rate Route Frequency Ordered Stop   01/11/20 1000  remdesivir 100 mg in sodium chloride 0.9 % 100 mL IVPB       "Followed by" Linked Group Details   100 mg 200 mL/hr over 30 Minutes Intravenous Every 24 hours 01/10/20 2129 01/14/20 1918   01/11/20 1000  remdesivir 100 mg in sodium chloride  0.9 % 100 mL IVPB  Status:  Discontinued       "Followed by" Linked Group Details   100 mg 200 mL/hr over 30 Minutes Intravenous Daily 01/10/20 2321 01/10/20 2339   01/10/20 2321  remdesivir 200 mg in sodium chloride 0.9% 250 mL IVPB  Status:  Discontinued       "Followed by" Linked Group Details   200 mg 580 mL/hr over 30 Minutes Intravenous Once 01/10/20 2321 01/10/20 2339   01/10/20 2200  remdesivir 200 mg in sodium chloride 0.9% 250 mL IVPB       "Followed by" Linked Group Details   200 mg 580 mL/hr over 30 Minutes Intravenous Once 01/10/20 2129 01/11/20 0135        Emeline Gins Anwitha Mapes M.D on 01/21/2020 at 3:17 PM  To page go to [email or web address] -   [facility] -  Office  [phone]     See all Orders from today for further details    Objective:   Vitals:   01/21/20 1328 01/21/20 1400 01/21/20 1430 01/21/20 1500  BP: (!) 168/57 (!) 183/78 (!) 174/69 (!) 169/87  Pulse: 69 78 79 71  Resp:       Temp:      TempSrc:      SpO2:      Weight:      Height:        Wt Readings from Last 3 Encounters:  01/21/20 70.8 kg  01/03/20 69.9 kg  11/23/19 69.9 kg    No intake or output data in the 24 hours ending 01/21/20 1517   Physical Exam  Awake Alert, Oriented X1, demented Symmetrical Chest wall movement, Good air movement bilaterally, CTAB RRR,No Gallops,Rubs or new Murmurs, No Parasternal Heave +ve B.Sounds, Abd Soft, No tenderness, No rebound - guarding or rigidity. No Cyanosis, Clubbing or edema, No new Rash or bruise         Data Review:    CBC Recent Labs  Lab 01/15/20 0430 01/17/20 0727 01/19/20 1130 01/21/20 0119  WBC 4.6 3.9* 5.6 5.0  HGB 10.2* 9.5* 8.5* 8.9*  HCT 32.9* 29.8* 27.2* 28.6*  PLT 136* 207 287 289  MCV 100.6* 100.0 100.7* 101.4*  MCH 31.2 31.9 31.5 31.6  MCHC 31.0 31.9 31.3 31.1  RDW 17.2* 16.8* 17.2* 16.8*  LYMPHSABS 0.9  --   --   --   MONOABS 0.7  --   --   --   EOSABS 0.0  --   --   --   BASOSABS 0.0  --   --   --     Chemistries  Recent Labs  Lab 01/15/20 0430 01/17/20 0727 01/19/20 1130 01/21/20 0119  NA 136 135 135 137  K 3.7 4.4 4.1 3.9  CL 96* 94* 95* 96*  CO2 _0 GLUCOSE 86 97 115* 113*  BUN 17 40* 33* 14  CREATININE 3.73* 7.53* 6.43* 5.14*  CALCIUM 8.6* 9.2 9.0 9.7  AST 35  --   --   --   ALT 21  --   --   --   ALKPHOS 74  --   --   --   BILITOT 0.7  --   --   --   MG 1.8  --   --   --      ------------------------------------------------------------------------------------------------------------------ No results for input(s): CHOL, HDL, LDLCALC, TRIG, CHOLHDL, LDLDIRECT in the last 72 hours.  Lab Results  Component Value Date   HGBA1C 8.1 (H) 01/10/2020   ------------------------------------------------------------------------------------------------------------------  No results for input(s): TSH, T4TOTAL, T3FREE, THYROIDAB in the last 72 hours.  Invalid input(s): FREET3  Cardiac  Enzymes No results for input(s): CKMB, TROPONINI, MYOGLOBIN in the last 168 hours.  Invalid input(s): CK ------------------------------------------------------------------------------------------------------------------    Component Value Date/Time   BNP 4,401.9 (H) 01/15/2020 0430    Micro Results No results found for this or any previous visit (from the past 240 hour(s)).  Radiology Reports CT HEAD WO CONTRAST  Result Date: 01/08/2020 CLINICAL DATA:  Mental status change EXAM: CT HEAD WITHOUT CONTRAST TECHNIQUE: Contiguous axial images were obtained from the base of the skull through the vertex without intravenous contrast. COMPARISON:  CT head dated 09/30/2019 FINDINGS: Brain: No evidence of acute infarction, hemorrhage, hydrocephalus, extra-axial collection or mass lesion/mass effect. There is mild cerebral volume loss with associated ex vacuo dilatation. Periventricular white matter hypoattenuation likely represents chronic small vessel ischemic disease. Vascular: There are vascular calcifications in the carotid siphons. Skull: Normal. Negative for fracture or focal lesion. Sinuses/Orbits: No acute finding. Other: None. IMPRESSION: No acute intracranial process. Electronically Signed   By: Zerita Boers M.D.   On: 01/08/2020 20:42   PERIPHERAL VASCULAR CATHETERIZATION  Result Date: 01/03/2020 Patient name: SHINIKA ESTELLE MRN: [id number] DOB: 11-30-1950 Sex: female 01/03/2020 Pre-operative Diagnosis: Bleeding from left upper arm dialysis graft Post-operative diagnosis:  Same Surgeon:  Annamarie Major Procedure Performed:  1.  Ultrasound guided access, left upper arm dialysis graft  2.  Shuntogram  3.  Balloon venoplasty, left axillary vein (peripheral)  4.  Conscious sedation, 25 minutes  Indications: The patient came to the ER today for the second time in 3 days for bleeding from her dialysis graft.  I have recommended shuntogram to evaluate for central stenosis. Procedure:  The patient was  identified in the holding area and taken to room 8.  The patient was then placed supine on the table and prepped and draped in the usual sterile fashion.  A time out was called.  Conscious sedation was administered with the use of IV fentanyl and Versed under continuous physician and nurse monitoring.  Heart rate, blood pressure, and oxygen saturations were continuously monitored.  Total sedation time was 25 minutes ultrasound was used to evaluate the fistula.  The vein was patent and compressible.  A digital ultrasound image was acquired.  The fistula was then accessed under ultrasound guidance using a micropuncture needle.  An 018 wire was then asvanced without resistance and a micropuncture sheath was placed.  Contrast injections were then performed through the sheath. Findings: The central venous system is widely patent.  A stent is visualized at the venous outflow tract.  Just beyond the stent there is a 80% stenosis within the axillary vein.  The arterial venous anastomosis is widely patent.  Intervention: After the above images were acquired the decision was made to proceed with intervention.  A 7 French sheath was inserted over a Bentson wire.  I used a Glidewire to cross the lesion.  I then proceeded with balloon venoplasty using an 8 x 40 Mustang balloon.  Follow-up imaging revealed improved but suboptimal result and so I upsized to a 12 x 40 Mustang balloon.  This was taken to 20 atm.  Follow-up imaging revealed complete resolution of the stenosis.  The access site was closed with a 4-0 Monocryl. Impression:  #1  Greater than 80% stenosis just beyond the venous outflow stent treated using an 8 mm followed by 12 mm balloon with residual stenosis less than 10%.  Theotis Burrow, M.D., FACS [facility] Office: [phone] Pager:  [phone]  DG Chest Port 1 View  Result Date: 01/10/2020 CLINICAL DATA:  Hypoxia, shortness of breath, COVID-19 positive EXAM: PORTABLE  CHEST 1 VIEW COMPARISON:  09/30/2019 FINDINGS: Single frontal view of the chest demonstrates persistent enlargement of the cardiac silhouette. There is chronic interstitial prominence and mild central vascular congestion. No focal airspace disease, effusion, or pneumothorax. No acute bony abnormalities. IMPRESSION: 1. Chronic central vascular congestion and interstitial prominence. No acute airspace disease. Electronically Signed   By: [name] M.D.   On: 01/10/2020 16:10

## 2020-01-21 NOTE — Progress Notes (Addendum)
Hypoglycemic Event  CBG: 58  Treatment: 10% of dinner tray  Symptoms: fatigue  Follow-up CBG: 62 Time:1820  Possible Reasons for Event: poor oral intake  Comments/MD notified:n/a, hypoglycemic order set. Provided additional oral replacement feed orange juice will recheck.   Follow-up CBG: 84 Time:1853  Possible Reasons for Event: poor oral intake  Comments/MD notified:n/a, hypoglycemic order set. Provided additional oral replacement feed orange juice will recheck. Informed NT to endorse to oncoming NT to monitor for s/s of hypoglycemia and increase oral intake.   Clemmie Krill

## 2020-01-21 NOTE — Progress Notes (Signed)
HD tx complete-pt noted with prolonged bleeding post dialysis requiring sites to held for approximately 25 minutes. Pt stable upon transfer.

## 2020-01-21 NOTE — Progress Notes (Signed)
Physical Therapy Treatment Patient Details Name: Robin [name] MRN: [id number] DOB: Oct 03, 1950 Today's Date: 01/21/2020    History of Present Illness Pt is a 69 yo female presenting via EMS on 01/10/20 with decreased responsiveness and SOB; tested (+) COVID. PMH includes DM, HTN, ESRD (HD TTS), bipolar disorder, dementia.    PT Comments    Pt sleeping upon arrival, required increased assist for bed mobility and transfer in order to initiate movement. Ambulating 50 feet with a walker at a min assist level. Presents as a high fall risk based on decreased gait speed and safety awareness. Continue to recommend SNF for ongoing Physical Therapy.      Follow Up Recommendations  SNF;Supervision/Assistance - 24 hour     Equipment Recommendations  None recommended by PT    Recommendations for Other Services       Precautions / Restrictions Precautions Precautions: Fall Restrictions Weight Bearing Restrictions: No    Mobility  Bed Mobility Overal bed mobility: Needs Assistance Bed Mobility: Supine to Sit     Supine to sit: Mod assist     General bed mobility comments: ModA for initiation, assist to guide BLE's off edge of bed and trunk to upright  Transfers Overall transfer level: Needs assistance Equipment used: Rolling walker (2 wheeled) Transfers: Sit to/from Stand Sit to Stand: Mod assist         General transfer comment: ModA for initiation  Ambulation/Gait Ambulation/Gait assistance: Min assist Gait Distance (Feet): 50 Feet Assistive device: Rolling walker (2 wheeled) Gait Pattern/deviations: Decreased stride length;Step-through pattern;Shuffle Gait velocity: decreased Gait velocity interpretation: <1.31 ft/sec, indicative of household ambulator General Gait Details: Shortened step length, increased right foot external rotation, no overt LOB. Manual assist provided for walker negotiation (turns, maintaining on straight path).    Stairs              Wheelchair Mobility    Modified Rankin (Stroke Patients Only)       Balance Overall balance assessment: Needs assistance Sitting-balance support: No upper extremity supported;Feet supported Sitting balance-Leahy Scale: Good     Standing balance support: Bilateral upper extremity supported;During functional activity;No upper extremity supported Standing balance-Leahy Scale: Poor                              Cognition Arousal/Alertness: Awake/alert Behavior During Therapy: Flat affect Overall Cognitive Status: History of cognitive impairments - at baseline                                        Exercises      General Comments        Pertinent Vitals/Pain Pain Assessment: Faces Faces Pain Scale: No hurt    Home Living                      Prior Function            PT Goals (current goals can now be found in the care plan section) Acute Rehab PT Goals Patient Stated Goal: did not state Potential to Achieve Goals: Good Progress towards PT goals: Progressing toward goals    Frequency    Min 2X/week      PT Plan Current plan remains appropriate    Co-evaluation              AM-PAC PT "6 Clicks" Mobility  Outcome Measure  Help needed turning from your back to your side while in a flat bed without using bedrails?: A Little Help needed moving from lying on your back to sitting on the side of a flat bed without using bedrails?: A Lot Help needed moving to and from a bed to a chair (including a wheelchair)?: A Little Help needed standing up from a chair using your arms (e.g., wheelchair or bedside chair)?: A Lot Help needed to walk in hospital room?: A Little Help needed climbing 3-5 steps with a railing? : A Lot 6 Click Score: 15    End of Session   Activity Tolerance: Patient tolerated treatment well Patient left: in chair;with call bell/phone within reach;with chair alarm set Nurse Communication:  Mobility status PT Visit Diagnosis: Muscle weakness (generalized) (M62.81);Difficulty in walking, not elsewhere classified (R26.2)     Time: 1150-1210 PT Time Calculation (min) (ACUTE ONLY): 20 min  Charges:  $Therapeutic Activity: 8-22 mins                       Wyona Almas, PT, DPT Acute Rehabilitation Services Pager [phone] Office [phone]    Deno Etienne 01/21/2020, 2:20 PM

## 2020-01-21 NOTE — Plan of Care (Signed)
  Problem: Education: Goal: Knowledge of risk factors and measures for prevention of condition will improve Outcome: Progressing   Problem: Education: Goal: Knowledge of General Education information will improve Description: Including pain rating scale, medication(s)/side effects and non-pharmacologic comfort measures Outcome: Progressing   

## 2020-01-21 NOTE — Procedures (Signed)
   I was present at this dialysis session, have reviewed the session itself and made  appropriate changes Kelly Splinter MD [place] pager [phone]   01/21/2020, 3:33 PM

## 2020-01-22 LAB — GLUCOSE, CAPILLARY
Glucose-Capillary: 88 mg/dL (ref 70–99)
Glucose-Capillary: 116 mg/dL — ABNORMAL HIGH (ref 70–99)
Glucose-Capillary: 228 mg/dL — ABNORMAL HIGH (ref 70–99)

## 2020-01-22 MED ORDER — ISOSORBIDE MONONITRATE ER 30 MG PO TB24
30.0000 mg | ORAL_TABLET | Freq: Every day | ORAL | Status: DC
  Administered 2020-01-23 – 2020-01-30 (×7): 30 mg via ORAL
  Filled 2020-01-22 (×7): qty 1

## 2020-01-22 MED ORDER — METOPROLOL TARTRATE 50 MG PO TABS
75.0000 mg | ORAL_TABLET | Freq: Two times a day (BID) | ORAL | Status: DC
  Administered 2020-01-22 – 2020-01-30 (×16): 75 mg via ORAL
  Filled 2020-01-22 (×16): qty 1

## 2020-01-22 NOTE — Progress Notes (Signed)
PROGRESS NOTE                                                                                                                                                                                                             Patient Demographics:    Robin [name], is a 69 y.o. female, DOB - January 23, 1951, PIR:[id number]  Outpatient Primary MD for the [facility] is [facility] [place]    LOS - 12  Admit date - 01/10/2020    CC - SOB     Brief Narrative  - Robin [name] is a 69 y.o. female with medical history significant for diabetes mellitus, hypertension, end-stage renal disease on dialysis Tuesday Thursday Saturday, bipolar disorder, hypertension, dementia presents by EMS with decreased responsiveness and shortness of breath, she was diagnosed with Covid infection at the [place] facility on 01/10/2020 but was sent home as she was doing well.  Patient is quite demented and much of the history was obtained by the admitting physician through family.  She was admitted for the treatment of COVID-19 pneumonia.   Subjective:   Patient in bed, appears comfortable, denies any headache, no fever, no chills.    Assessment  & Plan :    Acute Hypoxic Resp. Failure due to Acute Covid 19 Viral Pneumonitis during the ongoing 2020 Covid 19 Pandemic  -she is unvaccinated, this has significantly improved, she have mild to moderate disease, she was treated with steroids and remdesivir, she did finish her treatment course. She is currently on room air.  Encouraged the patient to sit up in chair in the daytime use I-S and flutter valve for pulmonary toiletry and then prone in bed when at night.  Will advance activity and titrate down oxygen as possible.   Recent Labs  Lab 01/17/20 0727 01/19/20 1130 01/21/20 0119  WBC 3.9* 5.6 5.0  ALBUMIN 2.7* 2.7* 2.9*       ESRD.  On TTS schedule.  Nephrology on board.  Dementia.  At risk for delirium.   Supportive care.  CAD.  S/p 2 stents in the past.  Chest pain-free, to new combination of aspirin, statin, beta-blocker, Imdur and Eliquis.  Hypertension.  Currently stable on combination of Norvasc, beta-blocker, Imdur.  Continue and monitor.  Blood pressure is soft, amlodipine has been stopped, will decrease her metoprolol as well.   Dyslipidemia.  On statin.  Asymptomatic 9 beat NSVT vs Artifact on 8/21 - EF 55%, blood pressure is soft, amlodipine has been stopped, will decrease her beta-blockers.  DM type II.  She is with recurrent episodes of hypoglycemia, I will cut and change her diet to just renal end-stage renal/carb modified . Lab Results  Component Value Date   HGBA1C 8.1 (H) 01/10/2020   CBG (last 3)  Recent Labs    01/21/20 1849 01/21/20 2049 01/22/20 1209  GLUCAP 86 128* 88      Condition -   Guarded  Family Communication  : D/W daughter by phone 8/26.  Code Status :  Full  Consults  :  Renal  Procedures  :  None  PUD Prophylaxis : PPI  Disposition Plan  :    Status is: Inpatient  Remains inpatient appropriate because:Unsafe d/c plan   Dispo: The patient is from: Home              Anticipated d/c is to: SNF              Anticipated d/c date is: 1 day              Patient currently is medically stable to d/c.  Patient is medically stable for discharge, awaiting SNF bed availability   DVT Prophylaxis  :  Eliquis  Lab Results  Component Value Date   PLT 289 01/21/2020    Diet :  Diet Order            Diet renal/carb modified with fluid restriction Diet-HS Snack? Nothing; Fluid restriction: 1200 mL Fluid; Room service appropriate? Yes; Fluid consistency: Thin  Diet effective now                  Inpatient Medications  Scheduled Meds: . apixaban  2.5 mg Oral BID  . vitamin C  500 mg Oral Daily  . aspirin  81 mg Oral Daily  . atorvastatin  20 mg Oral QHS  . calcitRIOL  2.5 mcg Oral Q T,Th,Sa-HD  . Chlorhexidine Gluconate Cloth  6  each Topical Q0600  . darbepoetin (ARANESP) injection - DIALYSIS  40 mcg Intravenous Q Sat-HD  . insulin aspart  0-6 Units Subcutaneous TID WC  . isosorbide mononitrate  60 mg Oral Daily  . melatonin  10 mg Oral QHS  . metoprolol tartrate  100 mg Oral BID  . multivitamin  1 tablet Oral QHS  . pantoprazole  20 mg Oral BID  . zinc sulfate  220 mg Oral Daily   Continuous Infusions:  PRN Meds:.acetaminophen, albuterol, chlorpheniramine-HYDROcodone, guaiFENesin-dextromethorphan, loperamide, ondansetron (ZOFRAN) IV, senna-docusate  Antibiotics  :    Anti-infectives (From admission, onward)   Start     Dose/Rate Route Frequency Ordered Stop   01/11/20 1000  remdesivir 100 mg in sodium chloride 0.9 % 100 mL IVPB       "Followed by" Linked Group Details   100 mg 200 mL/hr over 30 Minutes Intravenous Every 24 hours 01/10/20 2129 01/14/20 1918   01/11/20 1000  remdesivir 100 mg in sodium chloride 0.9 % 100 mL IVPB  Status:  Discontinued       "Followed by" Linked Group Details   100 mg 200 mL/hr over 30 Minutes Intravenous Daily 01/10/20 2321 01/10/20 2339   01/10/20 2321  remdesivir 200 mg in sodium chloride 0.9% 250 mL IVPB  Status:  Discontinued       "Followed by" Linked Group Details   200 mg 580 mL/hr over  30 Minutes Intravenous Once 01/10/20 2321 01/10/20 2339   01/10/20 2200  remdesivir 200 mg in sodium chloride 0.9% 250 mL IVPB       "Followed by" Linked Group Details   200 mg 580 mL/hr over 30 Minutes Intravenous Once 01/10/20 2129 01/11/20 0135        Emeline Gins Aziel Morgan M.D on 01/22/2020 at 1:56 PM  To page go to [email or web address] -   [facility] -  Office  [phone]     See all Orders from today for further details    Objective:   Vitals:   01/21/20 2032 01/22/20 0533 01/22/20 0838 01/22/20 1238  BP: (!) 110/59 107/61 (!) 108/41 (!) 102/44  Pulse: 85 75 76 72  Resp: _0 Temp: 97.9 F (36.6 C) 98.9 F (37.2 C) 98.6 F (37 C) 98.2 F (36.8 C)   TempSrc: Oral Oral Oral Oral  SpO2: 94% 100% (!) 86% 100%  Weight:      Height:        Wt Readings from Last 3 Encounters:  01/21/20 69.9 kg  01/03/20 69.9 kg  11/23/19 69.9 kg     Intake/Output Summary (Last 24 hours) at 01/22/2020 1356 Last data filed at 01/21/2020 1700 Gross per 24 hour  Intake --  Output 0 ml  Net 0 ml     Physical Exam  Awake Alert, Oriented X 2, confused Symmetrical Chest wall movement, Good air movement bilaterally, CTAB RRR,No Gallops,Rubs or new Murmurs, No Parasternal Heave +ve B.Sounds, Abd Soft, No tenderness, No rebound - guarding or rigidity. No Cyanosis, Clubbing or edema, No new Rash or bruise          Data Review:    CBC Recent Labs  Lab 01/17/20 0727 01/19/20 1130 01/21/20 0119  WBC 3.9* 5.6 5.0  HGB 9.5* 8.5* 8.9*  HCT 29.8* 27.2* 28.6*  PLT 207 287 289  MCV 100.0 100.7* 101.4*  MCH 31.9 31.5 31.6  MCHC 31.9 31.3 31.1  RDW 16.8* 17.2* 16.8*    Chemistries  Recent Labs  Lab 01/17/20 0727 01/19/20 1130 01/21/20 0119  NA 135 135 137  K 4.4 4.1 3.9  CL 94* 95* 96*  CO2 _1 GLUCOSE 97 115* 113*  BUN 40* 33* 14  CREATININE 7.53* 6.43* 5.14*  CALCIUM 9.2 9.0 9.7     ------------------------------------------------------------------------------------------------------------------ No results for input(s): CHOL, HDL, LDLCALC, TRIG, CHOLHDL, LDLDIRECT in the last 72 hours.  Lab Results  Component Value Date   HGBA1C 8.1 (H) 01/10/2020   ------------------------------------------------------------------------------------------------------------------ No results for input(s): TSH, T4TOTAL, T3FREE, THYROIDAB in the last 72 hours.  Invalid input(s): FREET3  Cardiac Enzymes No results for input(s): CKMB, TROPONINI, MYOGLOBIN in the last 168 hours.  Invalid input(s): CK ------------------------------------------------------------------------------------------------------------------    Component Value  Date/Time   BNP 4,401.9 (H) 01/15/2020 0430    Micro Results No results found for this or any previous visit (from the past 240 hour(s)).  Radiology Reports CT HEAD WO CONTRAST  Result Date: 01/08/2020 CLINICAL DATA:  Mental status change EXAM: CT HEAD WITHOUT CONTRAST TECHNIQUE: Contiguous axial images were obtained from the base of the skull through the vertex without intravenous contrast. COMPARISON:  CT head dated 09/30/2019 FINDINGS: Brain: No evidence of acute infarction, hemorrhage, hydrocephalus, extra-axial collection or mass lesion/mass effect. There is mild cerebral volume loss with associated ex vacuo dilatation. Periventricular white matter hypoattenuation likely represents chronic small vessel ischemic disease. Vascular: There are vascular calcifications in the carotid siphons. Skull: Normal.  Negative for fracture or focal lesion. Sinuses/Orbits: No acute finding. Other: None. IMPRESSION: No acute intracranial process. Electronically Signed   By: Zerita Boers M.D.   On: 01/08/2020 20:42   PERIPHERAL VASCULAR CATHETERIZATION  Result Date: 01/03/2020 Patient name: NATHALY DAWKINS MRN: [id number] DOB: Sep 16, 1950 Sex: female 01/03/2020 Pre-operative Diagnosis: Bleeding from left upper arm dialysis graft Post-operative diagnosis:  Same Surgeon:  Annamarie Major Procedure Performed:  1.  Ultrasound guided access, left upper arm dialysis graft  2.  Shuntogram  3.  Balloon venoplasty, left axillary vein (peripheral)  4.  Conscious sedation, 25 minutes  Indications: The patient came to the ER today for the second time in 3 days for bleeding from her dialysis graft.  I have recommended shuntogram to evaluate for central stenosis. Procedure:  The patient was identified in the holding area and taken to room 8.  The patient was then placed supine on the table and prepped and draped in the usual sterile fashion.  A time out was called.  Conscious sedation was administered with the use of IV fentanyl and  Versed under continuous physician and nurse monitoring.  Heart rate, blood pressure, and oxygen saturations were continuously monitored.  Total sedation time was 25 minutes ultrasound was used to evaluate the fistula.  The vein was patent and compressible.  A digital ultrasound image was acquired.  The fistula was then accessed under ultrasound guidance using a micropuncture needle.  An 018 wire was then asvanced without resistance and a micropuncture sheath was placed.  Contrast injections were then performed through the sheath. Findings: The central venous system is widely patent.  A stent is visualized at the venous outflow tract.  Just beyond the stent there is a 80% stenosis within the axillary vein.  The arterial venous anastomosis is widely patent.  Intervention: After the above images were acquired the decision was made to proceed with intervention.  A 7 French sheath was inserted over a Bentson wire.  I used a Glidewire to cross the lesion.  I then proceeded with balloon venoplasty using an 8 x 40 Mustang balloon.  Follow-up imaging revealed improved but suboptimal result and so I upsized to a 12 x 40 Mustang balloon.  This was taken to 20 atm.  Follow-up imaging revealed complete resolution of the stenosis.  The access site was closed with a 4-0 Monocryl. Impression:  #1  Greater than 80% stenosis just beyond the venous outflow stent treated using an 8 mm followed by 12 mm balloon with residual stenosis less than 10%.  Theotis Burrow, M.D., FACS [facility] Office: [phone] Pager:  [phone]  DG Chest Port 1 View  Result Date: 01/10/2020 CLINICAL DATA:  Hypoxia, shortness of breath, COVID-19 positive EXAM: PORTABLE CHEST 1 VIEW COMPARISON:  09/30/2019 FINDINGS: Single frontal view of the chest demonstrates persistent enlargement of the cardiac silhouette. There is chronic interstitial prominence and mild central vascular congestion. No focal airspace disease,  effusion, or pneumothorax. No acute bony abnormalities. IMPRESSION: 1. Chronic central vascular congestion and interstitial prominence. No acute airspace disease. Electronically Signed   By: [name] M.D.   On: 01/10/2020 16:10

## 2020-01-22 NOTE — Plan of Care (Signed)
  Problem: Education: Goal: Knowledge of risk factors and measures for prevention of condition will improve 01/22/2020 1849 by Clemmie Krill, RN Outcome: Progressing 01/22/2020 1849 by Clemmie Krill, RN Outcome: Progressing   Problem: Education: Goal: Knowledge of General Education information will improve Description: Including pain rating scale, medication(s)/side effects and non-pharmacologic comfort measures 01/22/2020 1849 by Clemmie Krill, RN Outcome: Progressing 01/22/2020 1849 by Clemmie Krill, RN Outcome: Progressing

## 2020-01-22 NOTE — Progress Notes (Signed)
[facility] Progress Note  Subjective:  Patient not seen today directly given COVID-19 + status, utilizing data taken from chart +/- discussions w/ providers and staff.     Vitals:   01/22/20 0838 01/22/20 1238 01/22/20 1630 01/22/20 1954  BP: (!) 108/41 (!) 102/44 (!) 127/50 (!) 145/55  Pulse: 76 72 75 76  Resp: 20 18 18 16   Temp: 98.6 F (37 C) 98.2 F (36.8 C) 98.3 F (36.8 C) 98.5 F (36.9 C)  TempSrc: Oral Oral  Oral  SpO2: (!) 86% 100% 100% 100%  Weight:      Height:         Physical Exam:  Patient not examined today directly given COVID-19 + status, utilizing data taken from chart +/- discussions w/ providers and staff.      OP HD:  TTS [place]  3.5h  400/1.5  74.5kg  2/2 bath  Prof 1 Hep none  LUE AVF venofer 100mg  qtreatment mircera 66mcg q4weeks last given 12/01/19   Problem/Plan: 1. AHRF 2/2 acute COVID-19 pneumonitis: sp steroids, remdesivir. Now is awaiting SNF placement.  2. ESRD: on HD TTS. HD yest. Next HD Tuesday.  3. Anemia of chronic disease: HGB 9- 10's here. Avoid iron, on aranesp here 3mcg qweekly first dose 8/19 4. HTN/volume: 3-4 kg undery, stable BP, not eating much, min UF or keep even w/ HD 5. DM2, per primary 6. CAD s/p stents x 2. On eliquis, asa, statin, BB, imdur 7. Dispo: still awaiting a SNF bed   Robin [name] 01/22/2020, 8:13 PM   Recent Labs  Lab 01/19/20 1130 01/21/20 0119  K 4.1 3.9  BUN 33* 14  CREATININE 6.43* 5.14*  CALCIUM 9.0 9.7  PHOS 5.1* 4.1  HGB 8.5* 8.9*   Inpatient medications: . apixaban  2.5 mg Oral BID  . vitamin C  500 mg Oral Daily  . aspirin  81 mg Oral Daily  . atorvastatin  20 mg Oral QHS  . calcitRIOL  2.5 mcg Oral Q T,Th,Sa-HD  . Chlorhexidine Gluconate Cloth  6 each Topical Q0600  . darbepoetin (ARANESP) injection - DIALYSIS  40 mcg Intravenous Q Sat-HD  . insulin aspart  0-6 Units Subcutaneous TID WC  . [START ON 01/23/2020] isosorbide mononitrate  30 mg Oral Daily  .  melatonin  10 mg Oral QHS  . metoprolol tartrate  75 mg Oral BID  . multivitamin  1 tablet Oral QHS  . pantoprazole  20 mg Oral BID  . zinc sulfate  220 mg Oral Daily    acetaminophen, albuterol, chlorpheniramine-HYDROcodone, guaiFENesin-dextromethorphan, loperamide, ondansetron (ZOFRAN) IV, senna-docusate

## 2020-01-22 NOTE — Plan of Care (Signed)
  Problem: Education: Goal: Knowledge of risk factors and measures for prevention of condition will improve Outcome: Progressing   Problem: Education: Goal: Knowledge of General Education information will improve Description: Including pain rating scale, medication(s)/side effects and non-pharmacologic comfort measures Outcome: Progressing   

## 2020-01-23 LAB — GLUCOSE, CAPILLARY
Glucose-Capillary: 235 mg/dL — ABNORMAL HIGH (ref 70–99)
Glucose-Capillary: 191 mg/dL — ABNORMAL HIGH (ref 70–99)
Glucose-Capillary: 169 mg/dL — ABNORMAL HIGH (ref 70–99)
Glucose-Capillary: 155 mg/dL — ABNORMAL HIGH (ref 70–99)

## 2020-01-23 NOTE — TOC Initial Note (Signed)
Transition of Care [facility]) - Initial/Assessment Note    Patient Details  Name: Robin [name] MRN: [id number] Date of Birth: [birth year]  [facility]) CM/SW Contact:    Benard Halsted, LCSW Phone Number: 01/23/2020, 11:12 AM  Clinical Narrative:                 CSW contacted patient's daughter, Judson Roch, again and was able to get her on the phone. She confirmed request for SNF placement while she is on third shift at her dialysis center (per renal navigator, she can return to her regular shift at [facility] on 01/31/20). She stated that patient has help at home 5 days a week so she can return if no SNF bed is found but she does prefer her to be able to go to make it to the bathroom with her walker. CSW explained that more SNF options may open up once patient is off of isolation next week.   Expected Discharge Plan: Skilled Nursing Facility Barriers to Discharge: No SNF bed   Patient Goals and CMS Choice Patient states their goals for this hospitalization and ongoing recovery are:: Rehab then return home CMS Medicare.gov Compare Post Acute Care list provided to:: Patient Represenative (must comment) (Daughter) Choice offered to / list presented to : Adult Children  Expected Discharge Plan and Services Expected Discharge Plan: [place] In-house Referral: Clinical Social Work   Post Acute Care Choice: [place] Living arrangements for the past 2 months: [place]                                      Prior Living Arrangements/Services Living arrangements for the past 2 months: Single Family Home Lives with:: Self Patient language and need for interpreter reviewed:: Yes Do you feel safe going back to the place where you live?: Yes      Need for Family Participation in Patient Care: Yes (Comment) Care giver support system in place?: Yes (comment) Current home services: DME, Homehealth aide Criminal Activity/Legal Involvement Pertinent to  Current Situation/Hospitalization: No - Comment as needed  Activities of Daily Living Home Assistive Devices/Equipment: None ADL Screening (condition at time of admission) Patient's cognitive ability adequate to safely complete daily activities?: No Is the patient deaf or have difficulty hearing?: No Does the patient have difficulty seeing, even when wearing glasses/contacts?: No Does the patient have difficulty concentrating, remembering, or making decisions?: Yes Patient able to express need for assistance with ADLs?: Yes Does the patient have difficulty dressing or bathing?: No Independently performs ADLs?: Yes (appropriate for developmental age) Does the patient have difficulty walking or climbing stairs?: No Weakness of Legs: Both Weakness of Arms/Hands: None  Permission Sought/Granted Permission sought to share information with : Facility Sport and exercise psychologist, Family Supports Permission granted to share information with : No  Share Information with NAME: Judson Roch  Permission granted to share info w AGENCY: SNFs  Permission granted to share info w Relationship: Judson Roch  Permission granted to share info w Contact Information: [phone]  Emotional Assessment   Attitude/Demeanor/Rapport: Unable to Assess Affect (typically observed): Unable to Assess Orientation: : Oriented to Self Alcohol / Substance Use: Not Applicable Psych Involvement: No (comment)  Admission diagnosis:  SIRS (systemic inflammatory response syndrome) (HCC) [R65.10] Pneumonia due to COVID-19 virus [U07.1, J12.82] COVID-19 [U07.1] Patient Active Problem List   Diagnosis Date Noted  . COVID-19 01/10/2020  . Hypoxia  01/10/2020  . Chest pain 10/01/2019  . History of pulmonary embolism 10/01/2019  . Prolonged QT interval 10/01/2019  . Hypertensive urgency   . Other headache syndrome   . Orthostatic dizziness 04/15/2019  . Bradycardia 04/15/2019  . Syncope   . ESRD on hemodialysis ([place])   . Chest pain at  rest 09/16/2018  . Diabetic Charct's arthropathy ([place]) 10/07/2016  . GERD (gastroesophageal reflux disease) 09/09/2016  . Depression 09/09/2016  . Chronic diastolic (congestive) heart failure ([place]) 09/09/2016  . Elevated troponin 09/09/2016  . Hyperlipidemia associated with type 2 diabetes mellitus ([place]) 03/26/2015  . Charcot ankle 03/16/2015  . Anemia, chronic renal failure   . NSVT (nonsustained ventricular tachycardia) ([place])   . Hypertension due to end stage renal disease caused by type 2 diabetes mellitus, on dialysis ([place]) 12/09/2014  . Bipolar affective disorder ([place]) 12/09/2014  . Diabetes mellitus type 2, insulin dependent ([place]) 11/17/2014  . CAD (coronary artery disease), native coronary artery with 2 stents  11/17/2014   PCP:  [facility] [place]:   [id number] [address], [address] [place] [address] Phone: [phone] Fax: [place], [place] [address] Phone: [phone] Fax: [phone]     Social Determinants of Health (SDOH) Interventions    Readmission Risk Interventions Readmission Risk Prevention Plan 01/23/2020 04/17/2019  Transportation Screening Complete Complete  PCP or Specialist Appt within 3-5 Days - Complete  HRI or [place] - Complete  Social Work Consult for [place] Planning/Counseling - Not Complete  SW consult not completed comments - NA  Palliative Care Screening - Complete  Medication Review Press photographer) Referral to Pharmacy Referral to Pharmacy  PCP or Specialist appointment within 3-5 days of discharge Complete -  [place] or Home Care Consult Complete -  SW Recovery Care/Counseling Consult Complete -  Palliative Care Screening Not Applicable -  [place] Complete -  Some recent data might be hidden

## 2020-01-23 NOTE — Progress Notes (Signed)
[facility] Progress Note  Subjective:    Seen in room, comfortable appearing, no c/o  On RA    Vitals:   01/22/20 1238 01/22/20 1630 01/22/20 1954 01/23/20 0800  BP: (!) 102/44 (!) 127/50 (!) 145/55 (!) 150/73  Pulse: 72 75 76 71  Resp: 18 18 16 18   Temp: 98.2 F (36.8 C) 98.3 F (36.8 C) 98.5 F (36.9 C) 98.4 F (36.9 C)  TempSrc: Oral  Oral Oral  SpO2: 100% 100% 100% 94%  Weight:      Height:         Physical Exam: NAD, chronically ill apeparing RRR CTAB LUE AVF +B/T No edema nonfocal EOMI NCAT   OP HD:  TTS [place]  3.5h  400/1.5  74.5kg  2/2 bath  Prof 1 Hep none  LUE AVF venofer 100mg  qtreatment mircera 66mcg q4weeks last given 12/01/19   Problem/Plan: 1. AHRF 2/2 acute COVID-19 pneumonitis: sp steroids, remdesivir. Now is awaiting SNF placement. On R A 2. ESRD: on HD TTS. HD yest. Next HD Tuesday on schedule: 2K, 3.5h, 400/1.5. AVF. No hep 3. Anemia of chronic disease: Avoid iron, on aranesp here 26mcg qweekly first dose 8/19, CTM 4. HTN/volume: under dry, stable BP, not eating much, gently probe down post weights as BP permits 5. DM2, per primary 6. CAD s/p stents x 2. On eliquis, asa, statin, BB, imdur 7. Dispo: still awaiting a SNF bed  Rexene Agent  01/23/2020, 10:03 AM   Recent Labs  Lab 01/19/20 1130 01/21/20 0119  K 4.1 3.9  BUN 33* 14  CREATININE 6.43* 5.14*  CALCIUM 9.0 9.7  PHOS 5.1* 4.1  HGB 8.5* 8.9*   Inpatient medications: . apixaban  2.5 mg Oral BID  . vitamin C  500 mg Oral Daily  . aspirin  81 mg Oral Daily  . atorvastatin  20 mg Oral QHS  . calcitRIOL  2.5 mcg Oral Q T,Th,Sa-HD  . Chlorhexidine Gluconate Cloth  6 each Topical Q0600  . darbepoetin (ARANESP) injection - DIALYSIS  40 mcg Intravenous Q Sat-HD  . insulin aspart  0-6 Units Subcutaneous TID WC  . isosorbide mononitrate  30 mg Oral Daily  . melatonin  10 mg Oral QHS  . metoprolol tartrate  75 mg Oral BID  . multivitamin  1 tablet Oral  QHS  . pantoprazole  20 mg Oral BID  . zinc sulfate  220 mg Oral Daily    acetaminophen, albuterol, chlorpheniramine-HYDROcodone, guaiFENesin-dextromethorphan, loperamide, ondansetron (ZOFRAN) IV, senna-docusate

## 2020-01-23 NOTE — Progress Notes (Addendum)
PROGRESS NOTE                                                                                                                                                                                                             Patient Demographics:    Robin [name], is a 69 y.o. female, DOB - 10-24-1950, KXF:[id number]  Outpatient Primary MD for the patient is [facility]    LOS - 13  Admit date - 01/10/2020    CC - SOB     Brief Narrative  - Robin [name] is a 69 y.o. female with medical history significant for diabetes mellitus, hypertension, end-stage renal disease on dialysis Tuesday Thursday Saturday, bipolar disorder, hypertension, dementia presents by EMS with decreased responsiveness and shortness of breath, she was diagnosed with Covid infection at the [place] facility on 01/10/2020 but was sent home as she was doing well.  Patient is quite demented and much of the history was obtained by the admitting physician through family.  She was admitted for the treatment of COVID-19 pneumonia.   Subjective:   Patient in bed, appears comfortable, denies any headache, no fever, no chills.    Assessment  & Plan :    Acute Hypoxic Resp. Failure due to Acute Covid 19 Viral Pneumonitis during the ongoing 2020 Covid 19 Pandemic  -she is unvaccinated, this has significantly improved, she have mild to moderate disease, she was treated with steroids and remdesivir, she did finish her treatment course. She is currently on room air.  Encouraged the patient to sit up in chair in the daytime use I-S and flutter valve for pulmonary toiletry and then prone in bed when at night.  Will advance activity and titrate down oxygen as possible.   Recent Labs  Lab 01/17/20 0727 01/19/20 1130 01/21/20 0119  WBC 3.9* 5.6 5.0  ALBUMIN 2.7* 2.7* 2.9*       ESRD.  On TTS schedule.  Nephrology on board.  Dementia.  At risk for delirium.   Supportive care.  CAD.  S/p 2 stents in the past.  Chest pain-free, to new combination of aspirin, statin, beta-blocker, Imdur and Eliquis.  Hypertension.  Currently stable on combination of Norvasc, beta-blocker, Imdur.  Continue and monitor.  Blood pressure is soft, amlodipine has been stopped, will decrease her metoprolol as well.   Dyslipidemia.  On statin.  Asymptomatic 9 beat NSVT vs [facility] on 8/21 - EF 55%, her blood pressure is soft, she is off Norvasc, I have lowered her beta-blockers as well.  DM type II.  She is with recurrent episodes of hypoglycemia, I will cut and change her diet to just renal end-stage renal/carb modified . Lab Results  Component Value Date   HGBA1C 8.1 (H) 01/10/2020   CBG (last 3)  Recent Labs    01/22/20 2132 01/23/20 0743 01/23/20 1206  GLUCAP 228* 235* 191*      Condition -   Guarded  Family Communication  : D/W daughter by phone 8/26.  Code Status :  Full  Consults  :  Renal  Procedures  :  None  PUD Prophylaxis : PPI  Disposition Plan  :    Status is: Inpatient  Remains inpatient appropriate because:Unsafe d/c plan   Dispo: The patient is from: Home              Anticipated d/c is to: SNF              Anticipated d/c date is: 1 day              Patient currently is medically stable to d/c.  Patient is medically stable for discharge, awaiting SNF bed availability   DVT Prophylaxis  :  Eliquis  Lab Results  Component Value Date   PLT 289 01/21/2020    Diet :  Diet Order            Diet renal with fluid restriction Fluid restriction: 1200 mL Fluid; Room service appropriate? Yes; Fluid consistency: Thin  Diet effective now                  Inpatient Medications  Scheduled Meds: . apixaban  2.5 mg Oral BID  . vitamin C  500 mg Oral Daily  . aspirin  81 mg Oral Daily  . atorvastatin  20 mg Oral QHS  . calcitRIOL  2.5 mcg Oral Q T,Th,Sa-HD  . Chlorhexidine Gluconate Cloth  6 each Topical Q0600  .  darbepoetin (ARANESP) injection - DIALYSIS  40 mcg Intravenous Q Sat-HD  . insulin aspart  0-6 Units Subcutaneous TID WC  . isosorbide mononitrate  30 mg Oral Daily  . melatonin  10 mg Oral QHS  . metoprolol tartrate  75 mg Oral BID  . multivitamin  1 tablet Oral QHS  . pantoprazole  20 mg Oral BID  . zinc sulfate  220 mg Oral Daily   Continuous Infusions:  PRN Meds:.acetaminophen, albuterol, chlorpheniramine-HYDROcodone, guaiFENesin-dextromethorphan, loperamide, ondansetron (ZOFRAN) IV, senna-docusate  Antibiotics  :    Anti-infectives (From admission, onward)   Start     Dose/Rate Route Frequency Ordered Stop   01/11/20 1000  remdesivir 100 mg in sodium chloride 0.9 % 100 mL IVPB       "Followed by" Linked Group Details   100 mg 200 mL/hr over 30 Minutes Intravenous Every 24 hours 01/10/20 2129 01/14/20 1918   01/11/20 1000  remdesivir 100 mg in sodium chloride 0.9 % 100 mL IVPB  Status:  Discontinued       "Followed by" Linked Group Details   100 mg 200 mL/hr over 30 Minutes Intravenous Daily 01/10/20 2321 01/10/20 2339   01/10/20 2321  remdesivir 200 mg in sodium chloride 0.9% 250 mL IVPB  Status:  Discontinued       "Followed by" Linked Group Details   200 mg 580 mL/hr over  30 Minutes Intravenous Once 01/10/20 2321 01/10/20 2339   01/10/20 2200  remdesivir 200 mg in sodium chloride 0.9% 250 mL IVPB       "Followed by" Linked Group Details   200 mg 580 mL/hr over 30 Minutes Intravenous Once 01/10/20 2129 01/11/20 0135        Emeline Gins Maraya Gwilliam M.D on 01/23/2020 at 3:18 PM  To page go to [email or web address] -   [facility] -  Office  [phone]     See all Orders from today for further details    Objective:   Vitals:   01/22/20 1630 01/22/20 1954 01/23/20 0800 01/23/20 1213  BP: (!) 127/50 (!) 145/55 (!) 150/73 (!) 163/61  Pulse: 75 76 71 74  Resp: _0 Temp: 98.3 F (36.8 C) 98.5 F (36.9 C) 98.4 F (36.9 C) 98.4 F (36.9 C)  TempSrc:  Oral Oral  Oral  SpO2: 100% 100% 94% 96%  Weight:      Height:        Wt Readings from Last 3 Encounters:  01/21/20 69.9 kg  01/03/20 69.9 kg  11/23/19 69.9 kg    No intake or output data in the 24 hours ending 01/23/20 1518   Physical Exam  Awake Alert, Oriented X 2, confused Symmetrical Chest wall movement, Good air movement bilaterally, CTAB RRR,No Gallops,Rubs or new Murmurs, No Parasternal Heave +ve B.Sounds, Abd Soft, No tenderness, No rebound - guarding or rigidity. No Cyanosis, Clubbing or edema, No new Rash or bruise           Data Review:    CBC Recent Labs  Lab 01/17/20 0727 01/19/20 1130 01/21/20 0119  WBC 3.9* 5.6 5.0  HGB 9.5* 8.5* 8.9*  HCT 29.8* 27.2* 28.6*  PLT 207 287 289  MCV 100.0 100.7* 101.4*  MCH 31.9 31.5 31.6  MCHC 31.9 31.3 31.1  RDW 16.8* 17.2* 16.8*    Chemistries  Recent Labs  Lab 01/17/20 0727 01/19/20 1130 01/21/20 0119  NA 135 135 137  K 4.4 4.1 3.9  CL 94* 95* 96*  CO2 _1 GLUCOSE 97 115* 113*  BUN 40* 33* 14  CREATININE 7.53* 6.43* 5.14*  CALCIUM 9.2 9.0 9.7     ------------------------------------------------------------------------------------------------------------------ No results for input(s): CHOL, HDL, LDLCALC, TRIG, CHOLHDL, LDLDIRECT in the last 72 hours.  Lab Results  Component Value Date   HGBA1C 8.1 (H) 01/10/2020   ------------------------------------------------------------------------------------------------------------------ No results for input(s): TSH, T4TOTAL, T3FREE, THYROIDAB in the last 72 hours.  Invalid input(s): FREET3  Cardiac Enzymes No results for input(s): CKMB, TROPONINI, MYOGLOBIN in the last 168 hours.  Invalid input(s): CK ------------------------------------------------------------------------------------------------------------------    Component Value Date/Time   BNP 4,401.9 (H) 01/15/2020 0430    Micro Results No results found for this or any previous visit (from  the past 240 hour(s)).  Radiology Reports CT HEAD WO CONTRAST  Result Date: 01/08/2020 CLINICAL DATA:  Mental status change EXAM: CT HEAD WITHOUT CONTRAST TECHNIQUE: Contiguous axial images were obtained from the base of the skull through the vertex without intravenous contrast. COMPARISON:  CT head dated 09/30/2019 FINDINGS: Brain: No evidence of acute infarction, hemorrhage, hydrocephalus, extra-axial collection or mass lesion/mass effect. There is mild cerebral volume loss with associated ex vacuo dilatation. Periventricular white matter hypoattenuation likely represents chronic small vessel ischemic disease. Vascular: There are vascular calcifications in the carotid siphons. Skull: Normal. Negative for fracture or focal lesion. Sinuses/Orbits: No acute finding. Other: None. IMPRESSION: No acute intracranial process. Electronically Signed  By: Zerita Boers M.D.   On: 01/08/2020 20:42   PERIPHERAL VASCULAR CATHETERIZATION  Result Date: 01/03/2020 Patient name: JENALEE TREVIZO MRN: [id number] DOB: 06/11/1950 Sex: female 01/03/2020 Pre-operative Diagnosis: Bleeding from left upper arm dialysis graft Post-operative diagnosis:  Same Surgeon:  Annamarie Major Procedure Performed:  1.  Ultrasound guided access, left upper arm dialysis graft  2.  Shuntogram  3.  Balloon venoplasty, left axillary vein (peripheral)  4.  Conscious sedation, 25 minutes  Indications: The patient came to the ER today for the second time in 3 days for bleeding from her dialysis graft.  I have recommended shuntogram to evaluate for central stenosis. Procedure:  The patient was identified in the holding area and taken to room 8.  The patient was then placed supine on the table and prepped and draped in the usual sterile fashion.  A time out was called.  Conscious sedation was administered with the use of IV fentanyl and Versed under continuous physician and nurse monitoring.  Heart rate, blood pressure, and oxygen saturations were continuously  monitored.  Total sedation time was 25 minutes ultrasound was used to evaluate the fistula.  The vein was patent and compressible.  A digital ultrasound image was acquired.  The fistula was then accessed under ultrasound guidance using a micropuncture needle.  An 018 wire was then asvanced without resistance and a micropuncture sheath was placed.  Contrast injections were then performed through the sheath. Findings: The central venous system is widely patent.  A stent is visualized at the venous outflow tract.  Just beyond the stent there is a 80% stenosis within the axillary vein.  The arterial venous anastomosis is widely patent.  Intervention: After the above images were acquired the decision was made to proceed with intervention.  A 7 French sheath was inserted over a Bentson wire.  I used a Glidewire to cross the lesion.  I then proceeded with balloon venoplasty using an 8 x 40 Mustang balloon.  Follow-up imaging revealed improved but suboptimal result and so I upsized to a 12 x 40 Mustang balloon.  This was taken to 20 atm.  Follow-up imaging revealed complete resolution of the stenosis.  The access site was closed with a 4-0 Monocryl. Impression:  #1  Greater than 80% stenosis just beyond the venous outflow stent treated using an 8 mm followed by 12 mm balloon with residual stenosis less than 10%.  Theotis Burrow, M.D., FACS [facility] Office: [phone] Pager:  [phone]  DG Chest Port 1 View  Result Date: 01/10/2020 CLINICAL DATA:  Hypoxia, shortness of breath, COVID-19 positive EXAM: PORTABLE CHEST 1 VIEW COMPARISON:  09/30/2019 FINDINGS: Single frontal view of the chest demonstrates persistent enlargement of the cardiac silhouette. There is chronic interstitial prominence and mild central vascular congestion. No focal airspace disease, effusion, or pneumothorax. No acute bony abnormalities. IMPRESSION: 1. Chronic central vascular congestion and interstitial  prominence. No acute airspace disease. Electronically Signed   By: [name] M.D.   On: 01/10/2020 16:10

## 2020-01-24 LAB — GLUCOSE, CAPILLARY
Glucose-Capillary: 128 mg/dL — ABNORMAL HIGH (ref 70–99)
Glucose-Capillary: 79 mg/dL (ref 70–99)
Glucose-Capillary: 128 mg/dL — ABNORMAL HIGH (ref 70–99)
Glucose-Capillary: 112 mg/dL — ABNORMAL HIGH (ref 70–99)

## 2020-01-24 NOTE — Progress Notes (Signed)
Physical Therapy Treatment Patient Details Name: Robin [name] MRN: [id number] DOB: 01/21/1951 Today's Date: 01/24/2020    History of Present Illness Pt is a 69 yo female presenting via EMS on 01/10/20 with decreased responsiveness and SOB; tested (+) COVID. PMH includes DM, HTN, ESRD (HD TTS), bipolar disorder, dementia.   PT Comments    Pt with increased flat affect this session and slowed processing. Requiring significant increased time for all activity and up to modA to initiate movement. Once standing, pt can walk with RW and min guard, but requires constant cues for sequencing and to attend to task. Continue to recommend SNF-level therapies; spoke with daughter to provide update.    Follow Up Recommendations  SNF;Supervision/Assistance - 24 hour     Equipment Recommendations  None recommended by PT    Recommendations for Other Services       Precautions / Restrictions Precautions Precautions: Fall Restrictions Weight Bearing Restrictions: No    Mobility  Bed Mobility Overal bed mobility: Needs Assistance Bed Mobility: Supine to Sit     Supine to sit: Min assist     General bed mobility comments: MinA for initiation, assist to guide BLE's off edge of bed and trunk to upright  Transfers Overall transfer level: Needs assistance Equipment used: Rolling walker (2 wheeled) Transfers: Sit to/from Stand Sit to Stand: Mod assist         General transfer comment: ModA for initiation with HHA to elevate trunk  Ambulation/Gait Ambulation/Gait assistance: Min guard Gait Distance (Feet): 12 Feet Assistive device: Rolling walker (2 wheeled) Gait Pattern/deviations: Decreased stride length;Step-through pattern;Shuffle Gait velocity: decreased   General Gait Details: Shortened step length, significant increased time and effort, no overt LOB; frequent cues for initiation. Manual assist provided for walker negotiation (turns, maintaining on straight path).   Stairs              Wheelchair Mobility    Modified Rankin (Stroke Patients Only)       Balance Overall balance assessment: Needs assistance Sitting-balance support: No upper extremity supported;Feet supported Sitting balance-Leahy Scale: Good Sitting balance - Comments: Prolonged sitting EOB at supervision-level   Standing balance support: Bilateral upper extremity supported;During functional activity;No upper extremity supported Standing balance-Leahy Scale: Fair Standing balance comment: Can static stand without UE support to perform ADL tasks at sink; dynamic stability improved with UE support                            Cognition Arousal/Alertness: Awake/alert Behavior During Therapy: Flat affect Overall Cognitive Status: History of cognitive impairments - at baseline Area of Impairment: Attention;Memory;Following commands;Safety/judgement;Awareness;Problem solving                   Current Attention Level: Focused;Sustained Memory: Decreased short-term memory Following Commands: Follows one step commands with increased time;Follows one step commands inconsistently Safety/Judgement: Decreased awareness of safety;Decreased awareness of deficits Awareness: Intellectual Problem Solving: Slow processing;Decreased initiation;Requires verbal cues;Difficulty sequencing;Requires tactile cues General Comments: significant increased time needed to initiate tasks, as well as multimodal cueing. Pt with minimal verbal output and very flat affect. Easily distracted by lines and other object, requiring redirection to task      Exercises      General Comments General comments (skin integrity, edema, etc.): Spoke with daughter on phone in patient's room, talking to daughter did not seem to improve pt's alertness or flat affect at all. Multiple family members sick with COVID, unable to provide  24/7 assist, therefore daughter is hopeful for more SNF options once pt off isolation  precautions      Pertinent Vitals/Pain Pain Assessment: No/denies pain    Home Living                      Prior Function            PT Goals (current goals can now be found in the care plan section) Acute Rehab PT Goals Patient Stated Goal: Post-acute rehab at SNF PT Goal Formulation: With family Time For Goal Achievement: 02/07/20 Potential to Achieve Goals: Good Progress towards PT goals: Not progressing toward goals - comment (limited by fatigue vs. cognitive impairment)    Frequency    Min 2X/week      PT Plan Current plan remains appropriate    Co-evaluation              AM-PAC PT "6 Clicks" Mobility   Outcome Measure  Help needed turning from your back to your side while in a flat bed without using bedrails?: A Little Help needed moving from lying on your back to sitting on the side of a flat bed without using bedrails?: A Lot Help needed moving to and from a bed to a chair (including a wheelchair)?: A Little Help needed standing up from a chair using your arms (e.g., wheelchair or bedside chair)?: A Lot Help needed to walk in hospital room?: A Little Help needed climbing 3-5 steps with a railing? : A Lot 6 Click Score: 15    End of Session   Activity Tolerance: Patient tolerated treatment well Patient left: in bed;with call bell/phone within reach;with bed alarm set Nurse Communication: Mobility status PT Visit Diagnosis: Muscle weakness (generalized) (M62.81);Difficulty in walking, not elsewhere classified (R26.2)     Time: 1007-1219 PT Time Calculation (min) (ACUTE ONLY): 35 min  Charges:  $Therapeutic Activity: 23-37 mins                     Mabeline Caras, PT, DPT Acute Rehabilitation Services  Pager [phone] Office [place] 01/24/2020, 5:02 PM

## 2020-01-24 NOTE — Progress Notes (Signed)
PT Cancellation Note  Patient Details Name: LARONICA BHAGAT MRN: [id number] DOB: December 25, 1950   Cancelled Treatment:    Reason Eval/Treat Not Completed: Patient at procedure or test/unavailable. Will follow-up for PT treatment as schedule permits.  Mabeline Caras, PT, DPT Acute Rehabilitation Services  Pager [phone] Office [place] 01/24/2020, 8:22 AM

## 2020-01-24 NOTE — Procedures (Signed)
I was present at this dialysis session. I have reviewed the session itself and made appropriate changes.   2L UF goal.  2K bath.  Tol tx well.  Need labs   [facility] Weights   01/21/20 1320 01/21/20 1700 01/24/20 0732  Weight: 70.8 kg 69.9 kg 69.9 kg    Recent Labs  Lab 01/21/20 0119  NA 137  K 3.9  CL 96*  CO2 29  GLUCOSE 113*  BUN 14  CREATININE 5.14*  CALCIUM 9.7  PHOS 4.1    Recent Labs  Lab 01/19/20 1130 01/21/20 0119  WBC 5.6 5.0  HGB 8.5* 8.9*  HCT 27.2* 28.6*  MCV 100.7* 101.4*  PLT 287 289    Scheduled Meds: . apixaban  2.5 mg Oral BID  . vitamin C  500 mg Oral Daily  . aspirin  81 mg Oral Daily  . atorvastatin  20 mg Oral QHS  . calcitRIOL  2.5 mcg Oral Q T,Th,Sa-HD  . Chlorhexidine Gluconate Cloth  6 each Topical Q0600  . darbepoetin (ARANESP) injection - DIALYSIS  40 mcg Intravenous Q Sat-HD  . insulin aspart  0-6 Units Subcutaneous TID WC  . isosorbide mononitrate  30 mg Oral Daily  . melatonin  10 mg Oral QHS  . metoprolol tartrate  75 mg Oral BID  . multivitamin  1 tablet Oral QHS  . pantoprazole  20 mg Oral BID  . zinc sulfate  220 mg Oral Daily   Continuous Infusions: PRN Meds:.acetaminophen, albuterol, chlorpheniramine-HYDROcodone, guaiFENesin-dextromethorphan, loperamide, ondansetron (ZOFRAN) IV, senna-docusate   Pearson Grippe  MD 01/24/2020, 10:40 AM

## 2020-01-24 NOTE — Progress Notes (Signed)
PROGRESS NOTE                                                                                                                                                                                                             Patient Demographics:    Robin [name], is a 69 y.o. female, DOB - [birth year], [id number]:[id number]  [facility] [place]    LOS - 14  Admit date - 01/10/2020    CC - SOB     Brief Narrative  - Robin [name] is a 69 y.o. female with medical history significant for diabetes mellitus, hypertension, end-stage renal disease on dialysis Tuesday Thursday Saturday, bipolar disorder, hypertension, dementia presents by EMS with decreased responsiveness and shortness of breath, she was diagnosed with Covid infection at the [place] facility on 01/10/2020 but was sent home as she was doing well.  Patient is quite demented and much of the history was obtained by the admitting physician through family.  She was admitted for the treatment of COVID-19 pneumonia.   Subjective:   Patient in bed, appears comfortable, denies any headache, no fever, no chills.    Assessment  & Plan :    Acute Hypoxic Resp. Failure due to Acute Covid 19 Viral Pneumonitis during the ongoing 2020 Covid 19 Pandemic  -she is unvaccinated, this has significantly improved, she have mild to moderate disease, she was treated with steroids and remdesivir, she did finish her treatment course. She is currently on room air.  Encouraged the patient to sit up in chair in the daytime use I-S and flutter valve for pulmonary toiletry and then prone in bed when at night.  Will advance activity and titrate down oxygen as possible.   Recent Labs  Lab 01/19/20 1130 01/21/20 0119  WBC 5.6 5.0  ALBUMIN 2.7* 2.9*       ESRD.  On TTS schedule.  Nephrology on board.  Dementia.  At risk for delirium.  Supportive care.  CAD.  S/p 2  stents in the past.  Chest pain-free, she is on combination of aspirin, statin, beta-blocker, Imdur and Eliquis.  But given labile blood pressure, which has been recently soft, she is currently off Imdur Norvasc.  Hypertension.   -  Blood pressure is soft, amlodipine has been stopped, will decrease her metoprolol as  well.   Dyslipidemia.  On statin.  Asymptomatic 9 beat NSVT vs [facility] on 8/21 - EF 55%, her blood pressure is soft, she is off Norvasc, I have lowered her beta-blockers as well.  DM type II.  Is with few episodes of recurrent hypoglycemia, have changed her sliding scale to renal .  Lab Results  Component Value Date   HGBA1C 8.1 (H) 01/10/2020   CBG (last 3)  Recent Labs    01/23/20 2125 01/24/20 0719 01/24/20 1213  GLUCAP 155* 128* 79      Condition -   Guarded  Family Communication  : D/W daughter by phone 8/26.  Code Status :  Full  Consults  :  Renal  Procedures  :  None  PUD Prophylaxis : PPI  Disposition Plan  :    Status is: Inpatient  Remains inpatient appropriate because:Unsafe d/c plan   Dispo: The patient is from: Home              Anticipated d/c is to: SNF              Anticipated d/c date is: 1 day              Patient currently is medically stable to d/c.  Patient is medically stable for discharge, awaiting SNF bed availability   DVT Prophylaxis  :  Eliquis  Lab Results  Component Value Date   PLT 289 01/21/2020    Diet :  Diet Order            Diet renal with fluid restriction Fluid restriction: 1200 mL Fluid; Room service appropriate? Yes; Fluid consistency: Thin  Diet effective now                  Inpatient Medications  Scheduled Meds: . apixaban  2.5 mg Oral BID  . vitamin C  500 mg Oral Daily  . aspirin  81 mg Oral Daily  . atorvastatin  20 mg Oral QHS  . calcitRIOL  2.5 mcg Oral Q T,Th,Sa-HD  . Chlorhexidine Gluconate Cloth  6 each Topical Q0600  . darbepoetin (ARANESP) injection - DIALYSIS  40 mcg  Intravenous Q Sat-HD  . insulin aspart  0-6 Units Subcutaneous TID WC  . isosorbide mononitrate  30 mg Oral Daily  . melatonin  10 mg Oral QHS  . metoprolol tartrate  75 mg Oral BID  . multivitamin  1 tablet Oral QHS  . pantoprazole  20 mg Oral BID  . zinc sulfate  220 mg Oral Daily   Continuous Infusions:  PRN Meds:.acetaminophen, albuterol, chlorpheniramine-HYDROcodone, guaiFENesin-dextromethorphan, loperamide, ondansetron (ZOFRAN) IV, senna-docusate  Antibiotics  :    Anti-infectives (From admission, onward)   Start     Dose/Rate Route Frequency Ordered Stop   01/11/20 1000  remdesivir 100 mg in sodium chloride 0.9 % 100 mL IVPB       "Followed by" Linked Group Details   100 mg 200 mL/hr over 30 Minutes Intravenous Every 24 hours 01/10/20 2129 01/14/20 1918   01/11/20 1000  remdesivir 100 mg in sodium chloride 0.9 % 100 mL IVPB  Status:  Discontinued       "Followed by" Linked Group Details   100 mg 200 mL/hr over 30 Minutes Intravenous Daily 01/10/20 2321 01/10/20 2339   01/10/20 2321  remdesivir 200 mg in sodium chloride 0.9% 250 mL IVPB  Status:  Discontinued       "Followed by" Linked Group Details   200 mg 580 mL/hr over  30 Minutes Intravenous Once 01/10/20 2321 01/10/20 2339   01/10/20 2200  remdesivir 200 mg in sodium chloride 0.9% 250 mL IVPB       "Followed by" Linked Group Details   200 mg 580 mL/hr over 30 Minutes Intravenous Once 01/10/20 2129 01/11/20 0135        Emeline Gins Juancarlos Crescenzo M.D on 01/24/2020 at 4:05 PM  To page go to [email or web address] -   [facility] -  Office  [phone]     See all Orders from today for further details    Objective:   Vitals:   01/24/20 1030 01/24/20 1100 01/24/20 1111 01/24/20 1346  BP: 139/68 (!) 142/79 120/73 108/68  Pulse: 69 74 79   Resp: _0 Temp:   97.9 F (36.6 C) 99 F (37.2 C)  TempSrc:   Oral Oral  SpO2:   92% 95%  Weight:   67.9 kg   Height:        Wt Readings from Last 3 Encounters:   01/24/20 67.9 kg  01/03/20 69.9 kg  11/23/19 69.9 kg     Intake/Output Summary (Last 24 hours) at 01/24/2020 1605 Last data filed at 01/24/2020 1111 Gross per 24 hour  Intake --  Output 2000 ml  Net -2000 ml     Physical Exam  Awake Alert, Oriented X 2, she is pleasant, but forgetful, mildly confused. Symmetrical Chest wall movement, Good air movement bilaterally, CTAB RRR,No Gallops,Rubs or new Murmurs, No Parasternal Heave +ve B.Sounds, Abd Soft, No tenderness, No rebound - guarding or rigidity. No Cyanosis, Clubbing or edema, No new Rash or bruise       Data Review:    CBC Recent Labs  Lab 01/19/20 1130 01/21/20 0119  WBC 5.6 5.0  HGB 8.5* 8.9*  HCT 27.2* 28.6*  PLT 287 289  MCV 100.7* 101.4*  MCH 31.5 31.6  MCHC 31.3 31.1  RDW 17.2* 16.8*    Chemistries  Recent Labs  Lab 01/19/20 1130 01/21/20 0119  NA 135 137  K 4.1 3.9  CL 95* 96*  CO2 26 29  GLUCOSE 115* 113*  BUN 33* 14  CREATININE 6.43* 5.14*  CALCIUM 9.0 9.7     ------------------------------------------------------------------------------------------------------------------ No results for input(s): CHOL, HDL, LDLCALC, TRIG, CHOLHDL, LDLDIRECT in the last 72 hours.  Lab Results  Component Value Date   HGBA1C 8.1 (H) 01/10/2020   ------------------------------------------------------------------------------------------------------------------ No results for input(s): TSH, T4TOTAL, T3FREE, THYROIDAB in the last 72 hours.  Invalid input(s): FREET3  Cardiac Enzymes No results for input(s): CKMB, TROPONINI, MYOGLOBIN in the last 168 hours.  Invalid input(s): CK ------------------------------------------------------------------------------------------------------------------    Component Value Date/Time   BNP 4,401.9 (H) 01/15/2020 0430    Micro Results No results found for this or any previous visit (from the past 240 hour(s)).  Radiology Reports CT HEAD WO CONTRAST  Result  Date: 01/08/2020 CLINICAL DATA:  Mental status change EXAM: CT HEAD WITHOUT CONTRAST TECHNIQUE: Contiguous axial images were obtained from the base of the skull through the vertex without intravenous contrast. COMPARISON:  CT head dated 09/30/2019 FINDINGS: Brain: No evidence of acute infarction, hemorrhage, hydrocephalus, extra-axial collection or mass lesion/mass effect. There is mild cerebral volume loss with associated ex vacuo dilatation. Periventricular white matter hypoattenuation likely represents chronic small vessel ischemic disease. Vascular: There are vascular calcifications in the carotid siphons. Skull: Normal. Negative for fracture or focal lesion. Sinuses/Orbits: No acute finding. Other: None. IMPRESSION: No acute intracranial process. Electronically Signed   By: Zerita Boers  M.D.   On: 01/08/2020 20:42   PERIPHERAL VASCULAR CATHETERIZATION  Result Date: 01/03/2020 [name] MRN: [id number] DOB: [birth year] Sex: female 01/03/2020 Pre-operative Diagnosis: Bleeding from left upper arm dialysis graft Post-operative diagnosis:  Same Surgeon:  Annamarie Major Procedure Performed:  1.  Ultrasound guided access, left upper arm dialysis graft  2.  Shuntogram  3.  Balloon venoplasty, left axillary vein (peripheral)  4.  Conscious sedation, 25 minutes  Indications: The patient came to the ER today for the second time in 3 days for bleeding from her dialysis graft.  I have recommended shuntogram to evaluate for central stenosis. Procedure:  The patient was identified in the holding area and taken to room 8.  The patient was then placed supine on the table and prepped and draped in the usual sterile fashion.  A time out was called.  Conscious sedation was administered with the use of IV fentanyl and Versed under continuous physician and nurse monitoring.  Heart rate, blood pressure, and oxygen saturations were continuously monitored.  Total sedation time was 25 minutes ultrasound was used to  evaluate the fistula.  The vein was patent and compressible.  A digital ultrasound image was acquired.  The fistula was then accessed under ultrasound guidance using a micropuncture needle.  An 018 wire was then asvanced without resistance and a micropuncture sheath was placed.  Contrast injections were then performed through the sheath. Findings: The central venous system is widely patent.  A stent is visualized at the venous outflow tract.  Just beyond the stent there is a 80% stenosis within the axillary vein.  The arterial venous anastomosis is widely patent.  Intervention: After the above images were acquired the decision was made to proceed with intervention.  A 7 French sheath was inserted over a Bentson wire.  I used a Glidewire to cross the lesion.  I then proceeded with balloon venoplasty using an 8 x 40 Mustang balloon.  Follow-up imaging revealed improved but suboptimal result and so I upsized to a 12 x 40 Mustang balloon.  This was taken to 20 atm.  Follow-up imaging revealed complete resolution of the stenosis.  The access site was closed with a 4-0 Monocryl. Impression:  #1  Greater than 80% stenosis just beyond the venous outflow stent treated using an 8 mm followed by 12 mm balloon with residual stenosis less than 10%.  Theotis Burrow, M.D., FACS [facility] Office: [phone] Pager:  [phone]  DG Chest Port 1 View  Result Date: 01/10/2020 CLINICAL DATA:  Hypoxia, shortness of breath, COVID-19 positive EXAM: PORTABLE CHEST 1 VIEW COMPARISON:  09/30/2019 FINDINGS: Single frontal view of the chest demonstrates persistent enlargement of the cardiac silhouette. There is chronic interstitial prominence and mild central vascular congestion. No focal airspace disease, effusion, or pneumothorax. No acute bony abnormalities. IMPRESSION: 1. Chronic central vascular congestion and interstitial prominence. No acute airspace disease. Electronically Signed   By: [name] M.D.   On: 01/10/2020 16:10

## 2020-01-25 DIAGNOSIS — E1122 Type 2 diabetes mellitus with diabetic chronic kidney disease: Secondary | ICD-10-CM

## 2020-01-25 DIAGNOSIS — F339 Major depressive disorder, recurrent, unspecified: Secondary | ICD-10-CM | POA: Insufficient documentation

## 2020-01-25 DIAGNOSIS — I120 Hypertensive chronic kidney disease with stage 5 chronic kidney disease or end stage renal disease: Secondary | ICD-10-CM

## 2020-01-25 LAB — GLUCOSE, CAPILLARY
Glucose-Capillary: 100 mg/dL — ABNORMAL HIGH (ref 70–99)
Glucose-Capillary: 149 mg/dL — ABNORMAL HIGH (ref 70–99)
Glucose-Capillary: 120 mg/dL — ABNORMAL HIGH (ref 70–99)
Glucose-Capillary: 106 mg/dL — ABNORMAL HIGH (ref 70–99)

## 2020-01-25 NOTE — Progress Notes (Signed)
PROGRESS NOTE                                                                                                                                                                                                             Patient Demographics:    Robin [name], is a 69 y.o. female, DOB - Apr 22, 1951, QJJ:[id number]  Outpatient Primary MD for the patient is [place] date - 01/10/2020   LOS - 15  No chief complaint on file.      Brief Narrative: Patient is a 69 y.o. female with PMHx of ESRD on HD TTS, dementia, HTN, DM-2-presented to the ED with shortness of breath-acute hypoxic respiratory failure secondary to COVID-19 pneumonia.  COVID-19 vaccinated status: Unvaccinated  Significant Events: 8/17>> Admit to [facility] for hypoxia secondary to COVID-19.  Significant studies: 8/15>> CT head: No acute intracranial process. 8/17>>Chest x-ray: Chronic central vascular congestion and interstitial prominence-no acute airspace disease.  COVID-19 medications: Steroids: 8/17>> 8/23 Remdesivir: 8/17>> 8/21  Antibiotics: None  Microbiology data: 8/17 >>blood culture: No growth  Procedures: None  Consults: None  DVT prophylaxis: apixaban (ELIQUIS) tablet 2.5 mg Start: 01/10/20 2359 apixaban (ELIQUIS) tablet 2.5 mg     Subjective:    Robin [name] today denies any chest pain or shortness of breath.  She is able to follow most of my commands.   Assessment  & Plan :   Acute Hypoxic Resp Failure due to Covid 19 Viral pneumonia: Significantly improved-has finished a course of steroids and Remdesivir.  Continue supportive care.  Fever: afebrile  O2 requirements:  SpO2: 99 % O2 Flow Rate (L/min): 2 L/min   COVID-19 Labs: No results for input(s): DDIMER, FERRITIN, LDH, CRP in the last 72 hours.     Component Value Date/Time   BNP 4,401.9 (H) 01/15/2020 0430    No results for input(s): PROCALCITON in  the last 168 hours.  Lab Results  Component Value Date   [place] NEGATIVE 01/03/2020   [place] NEGATIVE 10/01/2019   [place] NEGATIVE 04/14/2019    ESRD: On HD TTS-nephrology following.  CAD: No anginal symptoms-continue aspirin, statin, beta-blocker and Imdur.  History of pulmonary embolism: On Eliquis  HLD: Continue statin  DM-2 (A1c 8.1 on 8/17): CBG stable-continue SSI  Recent Labs    01/24/20 2106 01/25/20 0738 01/25/20 1150  GLUCAP 112*  100* 149*    ABG:    Component Value Date/Time   PHART 7.403 (H) 02/27/2008 1450   PCO2ART 38.6 02/27/2008 1450   PO2ART 65.4 (L) 02/27/2008 1450   HCO3 23.5 02/27/2008 1450   TCO2 26 01/08/2020 2021   ACIDBASEDEF 0.6 02/27/2008 1450   O2SAT 91.3 02/27/2008 1450    Vent Settings: N/A    Condition - Stable  Family Communication  : Daughter Judson Roch [phone]) updated over the phone on 9/1  Code Status :  Full Code  Diet :  Diet Order            Diet renal with fluid restriction Fluid restriction: 1200 mL Fluid; Room service appropriate? Yes; Fluid consistency: Thin  Diet effective now                  Disposition Plan  :   Status is: Inpatient  Remains inpatient appropriate because:Inpatient level of care appropriate due to severity of illness   Dispo: The patient is from: Home              Anticipated d/c is to: SNF              Anticipated d/c date is: > 3 days              Patient currently is medically stable to d/c.   Barriers to discharge: Awaiting SNF bed  Antimicorbials  :    Anti-infectives (From admission, onward)   Start     Dose/Rate Route Frequency Ordered Stop   01/11/20 1000  remdesivir 100 mg in sodium chloride 0.9 % 100 mL IVPB       "Followed by" Linked Group Details   100 mg 200 mL/hr over 30 Minutes Intravenous Every 24 hours 01/10/20 2129 01/14/20 1918   01/11/20 1000  remdesivir 100 mg in sodium chloride 0.9 % 100 mL IVPB  Status:  Discontinued       "Followed by"  Linked Group Details   100 mg 200 mL/hr over 30 Minutes Intravenous Daily 01/10/20 2321 01/10/20 2339   01/10/20 2321  remdesivir 200 mg in sodium chloride 0.9% 250 mL IVPB  Status:  Discontinued       "Followed by" Linked Group Details   200 mg 580 mL/hr over 30 Minutes Intravenous Once 01/10/20 2321 01/10/20 2339   01/10/20 2200  remdesivir 200 mg in sodium chloride 0.9% 250 mL IVPB       "Followed by" Linked Group Details   200 mg 580 mL/hr over 30 Minutes Intravenous Once 01/10/20 2129 01/11/20 0135      Inpatient Medications  Scheduled Meds: . apixaban  2.5 mg Oral BID  . vitamin C  500 mg Oral Daily  . aspirin  81 mg Oral Daily  . atorvastatin  20 mg Oral QHS  . calcitRIOL  2.5 mcg Oral Q T,Th,Sa-HD  . Chlorhexidine Gluconate Cloth  6 each Topical Q0600  . darbepoetin (ARANESP) injection - DIALYSIS  40 mcg Intravenous Q Sat-HD  . insulin aspart  0-6 Units Subcutaneous TID WC  . isosorbide mononitrate  30 mg Oral Daily  . melatonin  10 mg Oral QHS  . metoprolol tartrate  75 mg Oral BID  . multivitamin  1 tablet Oral QHS  . pantoprazole  20 mg Oral BID  . zinc sulfate  220 mg Oral Daily   Continuous Infusions: PRN Meds:.acetaminophen, albuterol, chlorpheniramine-HYDROcodone, guaiFENesin-dextromethorphan, loperamide, ondansetron (ZOFRAN) IV, senna-docusate   Time Spent in minutes  25  See all Orders from today for further details   Oren Binet M.D on 01/25/2020 at 3:57 PM  To page go to [email or web address] - use universal password  [facility] -  Office  [phone]    Objective:   Vitals:   01/24/20 2105 01/25/20 0520 01/25/20 0921 01/25/20 1434  BP: (!) 122/43 94/72 (!) 142/71 (!) 125/58  Pulse: 75 76  69  Resp: $Remo'18 18  18  'Ywtuo$ Temp: 98 F (36.7 C) 98.2 F (36.8 C)  98.3 F (36.8 C)  TempSrc: Oral Oral  Oral  SpO2: 93% 92%  99%  Weight:      Height:        Wt Readings from Last 3 Encounters:  01/24/20 67.9 kg  01/03/20 69.9 kg  11/23/19 69.9 kg      Intake/Output Summary (Last 24 hours) at 01/25/2020 1557 Last data filed at 01/25/2020 0527 Gross per 24 hour  Intake 150 ml  Output --  Net 150 ml     Physical Exam Gen Exam:Alert awake-not in any distress HEENT:atraumatic, normocephalic Chest: B/L clear to auscultation anteriorly CVS:S1S2 regular Abdomen:soft non tender, non distended Extremities:no edema Neurology: Non focal Skin: no rash   Data Review:    CBC Recent Labs  Lab 01/19/20 1130 01/21/20 0119  WBC 5.6 5.0  HGB 8.5* 8.9*  HCT 27.2* 28.6*  PLT 287 289  MCV 100.7* 101.4*  MCH 31.5 31.6  MCHC 31.3 31.1  RDW 17.2* 16.8*    Chemistries  Recent Labs  Lab 01/19/20 1130 01/21/20 0119  NA 135 137  K 4.1 3.9  CL 95* 96*  CO2 26 29  GLUCOSE 115* 113*  BUN 33* 14  CREATININE 6.43* 5.14*  CALCIUM 9.0 9.7   ------------------------------------------------------------------------------------------------------------------ No results for input(s): CHOL, HDL, LDLCALC, TRIG, CHOLHDL, LDLDIRECT in the last 72 hours.  Lab Results  Component Value Date   HGBA1C 8.1 (H) 01/10/2020   ------------------------------------------------------------------------------------------------------------------ No results for input(s): TSH, T4TOTAL, T3FREE, THYROIDAB in the last 72 hours.  Invalid input(s): FREET3 ------------------------------------------------------------------------------------------------------------------ No results for input(s): VITAMINB12, FOLATE, FERRITIN, TIBC, IRON, RETICCTPCT in the last 72 hours.  Coagulation profile No results for input(s): INR, PROTIME in the last 168 hours.  No results for input(s): DDIMER in the last 72 hours.  Cardiac Enzymes No results for input(s): CKMB, TROPONINI, MYOGLOBIN in the last 168 hours.  Invalid input(s): CK ------------------------------------------------------------------------------------------------------------------    Component Value Date/Time    BNP 4,401.9 (H) 01/15/2020 0430    Micro Results No results found for this or any previous visit (from the past 240 hour(s)).  Radiology Reports CT HEAD WO CONTRAST  Result Date: 01/08/2020 CLINICAL DATA:  Mental status change EXAM: CT HEAD WITHOUT CONTRAST TECHNIQUE: Contiguous axial images were obtained from the base of the skull through the vertex without intravenous contrast. COMPARISON:  CT head dated 09/30/2019 FINDINGS: Brain: No evidence of acute infarction, hemorrhage, hydrocephalus, extra-axial collection or mass lesion/mass effect. There is mild cerebral volume loss with associated ex vacuo dilatation. Periventricular white matter hypoattenuation likely represents chronic small vessel ischemic disease. Vascular: There are vascular calcifications in the carotid siphons. Skull: Normal. Negative for fracture or focal lesion. Sinuses/Orbits: No acute finding. Other: None. IMPRESSION: No acute intracranial process. Electronically Signed   By: Zerita Boers M.D.   On: 01/08/2020 20:42   PERIPHERAL VASCULAR CATHETERIZATION  Result Date: 01/03/2020 Patient name: NAESHA BUCKALEW MRN: [id number] DOB: 26-Oct-1950 Sex: female 01/03/2020 Pre-operative Diagnosis: Bleeding from left upper arm dialysis graft Post-operative diagnosis:  Same  Surgeon:  Annamarie Major Procedure Performed:  1.  Ultrasound guided access, left upper arm dialysis graft  2.  Shuntogram  3.  Balloon venoplasty, left axillary vein (peripheral)  4.  Conscious sedation, 25 minutes  Indications: The patient came to the ER today for the second time in 3 days for bleeding from her dialysis graft.  I have recommended shuntogram to evaluate for central stenosis. Procedure:  The patient was identified in the holding area and taken to room 8.  The patient was then placed supine on the table and prepped and draped in the usual sterile fashion.  A time out was called.  Conscious sedation was administered with the use of IV fentanyl and Versed under  continuous physician and nurse monitoring.  Heart rate, blood pressure, and oxygen saturations were continuously monitored.  Total sedation time was 25 minutes ultrasound was used to evaluate the fistula.  The vein was patent and compressible.  A digital ultrasound image was acquired.  The fistula was then accessed under ultrasound guidance using a micropuncture needle.  An 018 wire was then asvanced without resistance and a micropuncture sheath was placed.  Contrast injections were then performed through the sheath. Findings: The central venous system is widely patent.  A stent is visualized at the venous outflow tract.  Just beyond the stent there is a 80% stenosis within the axillary vein.  The arterial venous anastomosis is widely patent.  Intervention: After the above images were acquired the decision was made to proceed with intervention.  A 7 French sheath was inserted over a Bentson wire.  I used a Glidewire to cross the lesion.  I then proceeded with balloon venoplasty using an 8 x 40 Mustang balloon.  Follow-up imaging revealed improved but suboptimal result and so I upsized to a 12 x 40 Mustang balloon.  This was taken to 20 atm.  Follow-up imaging revealed complete resolution of the stenosis.  The access site was closed with a 4-0 Monocryl. Impression:  #1  Greater than 80% stenosis just beyond the venous outflow stent treated using an 8 mm followed by 12 mm balloon with residual stenosis less than 10%.  Theotis Burrow, M.D., FACS [facility] Office: [phone] Pager:  [phone]  DG Chest Port 1 View  Result Date: 01/10/2020 CLINICAL DATA:  Hypoxia, shortness of breath, COVID-19 positive EXAM: PORTABLE CHEST 1 VIEW COMPARISON:  09/30/2019 FINDINGS: Single frontal view of the chest demonstrates persistent enlargement of the cardiac silhouette. There is chronic interstitial prominence and mild central vascular congestion. No focal airspace disease, effusion, or  pneumothorax. No acute bony abnormalities. IMPRESSION: 1. Chronic central vascular congestion and interstitial prominence. No acute airspace disease. Electronically Signed   By: [name] M.D.   On: 01/10/2020 16:10

## 2020-01-25 NOTE — TOC Progression Note (Signed)
Transition of Care [facility]) - Progression Note    Patient Details  Name: Robin [name] MRN: [id number] Date of Birth: July 03, 1950  Transition of Care [facility]) CM/SW [place], LCSW Phone Number: 01/25/2020, 11:24 AM  Clinical Narrative:    CSW contacting SNFs to see who would be able to accept patient once she is off of isolation next week. Confirmed that her Dialysis schedule is TTS 6am at [place] once off isolation.    Expected Discharge Plan: [place] Barriers to Discharge: No SNF bed  Expected Discharge Plan and Services Expected Discharge Plan: [place] In-house Referral: Clinical Social Work   Post Acute Care Choice: [place] Living arrangements for the past 2 months: [place] Determinants of Health (SDOH) Interventions    Readmission Risk Interventions Readmission Risk Prevention Plan 01/23/2020 04/17/2019  Transportation Screening Complete Complete  PCP or Specialist Appt within 3-5 Days - Complete  HRI or [place] - Complete  Social Work Consult for [place] Planning/Counseling - Not Complete  SW consult not completed comments - NA  Palliative Care Screening - Complete  Medication Review Press photographer) Referral to Pharmacy Referral to Pharmacy  PCP or Specialist appointment within 3-5 days of discharge Complete -  [place] or Home Care Consult Complete -  SW Recovery Care/Counseling Consult Complete -  Palliative Care Screening Not Applicable -  [place] Complete -  Some recent data might be hidden

## 2020-01-25 NOTE — Progress Notes (Signed)
[facility] Progress Note  Subjective:    Not seen today  Awaiting SNF  HD yesterday, 2L UF post weight 67.9kg bed weight  On RA, BP st able    Vitals:   01/24/20 2105 01/25/20 0520 01/25/20 0921 01/25/20 1434  BP: (!) 122/43 94/72 (!) 142/71 (!) 125/58  Pulse: 75 76  69  Resp: 18 18  18   Temp: 98 F (36.7 C) 98.2 F (36.8 C)  98.3 F (36.8 C)  TempSrc: Oral Oral  Oral  SpO2: 93% 92%  99%  Weight:      Height:         Physical Exam: No exam today   OP HD:  TTS [place]  3.5h  400/1.5  74.5kg  2/2 bath  Prof 1 Hep none  LUE AVF venofer 100mg  qtreatment mircera 28mcg q4weeks last given 12/01/19   Problem/Plan: 1. AHRF 2/2 acute COVID-19 pneumonitis: sp steroids, remdesivir. Now is awaiting SNF placement. On RA 2. ESRD: on HD TTS. HD yest. Next HD on schedule: 2K, 3.5h, 400/1.5. AVF. No hep. 1-2L UF 3. Anemia of chronic disease: Avoid iron, on aranesp here 9mcg qweekly first dose 8/19, CTM 4. HTN/volume: under dry, stable BP, not eating much, gently probe down post weights as BP permits 5. DM2, per primary 6. CAD s/p stents x 2. On eliquis, asa, statin, BB, imdur 7. Dispo: still awaiting a SNF bed  Rexene Agent  01/25/2020, 2:46 PM   Recent Labs  Lab 01/19/20 1130 01/21/20 0119  K 4.1 3.9  BUN 33* 14  CREATININE 6.43* 5.14*  CALCIUM 9.0 9.7  PHOS 5.1* 4.1  HGB 8.5* 8.9*   Inpatient medications: . apixaban  2.5 mg Oral BID  . vitamin C  500 mg Oral Daily  . aspirin  81 mg Oral Daily  . atorvastatin  20 mg Oral QHS  . calcitRIOL  2.5 mcg Oral Q T,Th,Sa-HD  . Chlorhexidine Gluconate Cloth  6 each Topical Q0600  . darbepoetin (ARANESP) injection - DIALYSIS  40 mcg Intravenous Q Sat-HD  . insulin aspart  0-6 Units Subcutaneous TID WC  . isosorbide mononitrate  30 mg Oral Daily  . melatonin  10 mg Oral QHS  . metoprolol tartrate  75 mg Oral BID  . multivitamin  1 tablet Oral QHS  . pantoprazole  20 mg Oral BID  . zinc sulfate  220  mg Oral Daily    acetaminophen, albuterol, chlorpheniramine-HYDROcodone, guaiFENesin-dextromethorphan, loperamide, ondansetron (ZOFRAN) IV, senna-docusate

## 2020-01-26 ENCOUNTER — Encounter (HOSPITAL_COMMUNITY)
Admission: EM | Disposition: A | Discharge status: Skilled Nursing Facility | Payer: Self-pay | Source: Home / Self Care | Attending: Internal Medicine

## 2020-01-26 ENCOUNTER — Inpatient Hospital Stay (HOSPITAL_COMMUNITY): Payer: No Typology Code available for payment source | Admitting: Certified Registered Nurse Anesthetist

## 2020-01-26 ENCOUNTER — Inpatient Hospital Stay (HOSPITAL_COMMUNITY): Payer: No Typology Code available for payment source

## 2020-01-26 ENCOUNTER — Encounter (HOSPITAL_COMMUNITY): Payer: Self-pay | Admitting: Family Medicine

## 2020-01-26 DIAGNOSIS — N185 Chronic kidney disease, stage 5: Secondary | ICD-10-CM

## 2020-01-26 DIAGNOSIS — T82868A Thrombosis of vascular prosthetic devices, implants and grafts, initial encounter: Secondary | ICD-10-CM | POA: Diagnosis not present

## 2020-01-26 HISTORY — PX: ANGIOPLASTY: SHX39

## 2020-01-26 HISTORY — PX: FISTULOGRAM: SHX5832

## 2020-01-26 LAB — RENAL FUNCTION PANEL
Albumin: 2.2 g/dL — ABNORMAL LOW (ref 3.5–5.0)
Anion gap: 13 (ref 5–15)
BUN: 28 mg/dL — ABNORMAL HIGH (ref 8–23)
CO2: 26 mmol/L (ref 22–32)
Calcium: 8.8 mg/dL — ABNORMAL LOW (ref 8.9–10.3)
Chloride: 97 mmol/L — ABNORMAL LOW (ref 98–111)
Creatinine, Ser: 7.13 mg/dL — ABNORMAL HIGH (ref 0.44–1.00)
GFR calc Af Amer: 6 mL/min — ABNORMAL LOW (ref >60)
GFR calc non Af Amer: 5 mL/min — ABNORMAL LOW (ref >60)
Glucose, Bld: 132 mg/dL — ABNORMAL HIGH (ref 70–99)
Phosphorus: 3.5 mg/dL (ref 2.5–4.6)
Potassium: 3.5 mmol/L (ref 3.5–5.1)
Sodium: 136 mmol/L (ref 135–145)

## 2020-01-26 LAB — CBC
HCT: 24 % — ABNORMAL LOW (ref 36.0–46.0)
Hemoglobin: 7.7 g/dL — ABNORMAL LOW (ref 12.0–15.0)
MCH: 33.2 pg (ref 26.0–34.0)
MCHC: 32.1 g/dL (ref 30.0–36.0)
MCV: 103.4 fL — ABNORMAL HIGH (ref 80.0–100.0)
Platelets: 168 10*3/uL (ref 150–400)
RBC: 2.32 MIL/uL — ABNORMAL LOW (ref 3.87–5.11)
RDW: 17 % — ABNORMAL HIGH (ref 11.5–15.5)
WBC: 6.5 10*3/uL (ref 4.0–10.5)
nRBC: 0 % (ref 0.0–0.2)

## 2020-01-26 LAB — GLUCOSE, CAPILLARY
Glucose-Capillary: 73 mg/dL (ref 70–99)
Glucose-Capillary: 107 mg/dL — ABNORMAL HIGH (ref 70–99)
Glucose-Capillary: 102 mg/dL — ABNORMAL HIGH (ref 70–99)

## 2020-01-26 SURGERY — FISTULOGRAM
Anesthesia: Monitor Anesthesia Care | Site: Arm Upper | Laterality: Left

## 2020-01-26 MED ORDER — IODIXANOL 320 MG/ML IV SOLN
INTRAVENOUS | Status: DC | PRN
  Administered 2020-01-26: 16 mL

## 2020-01-26 MED ORDER — SODIUM CHLORIDE 0.9 % IV SOLN
INTRAVENOUS | Status: AC
  Filled 2020-01-26: qty 1.2

## 2020-01-26 MED ORDER — FENTANYL CITRATE (PF) 250 MCG/5ML IJ SOLN
INTRAMUSCULAR | Status: AC
  Filled 2020-01-26: qty 5

## 2020-01-26 MED ORDER — SODIUM CHLORIDE 0.9 % IV SOLN: INTRAVENOUS | Status: DC | PRN

## 2020-01-26 MED ORDER — LIDOCAINE-EPINEPHRINE (PF) 1 %-1:200000 IJ SOLN
INTRAMUSCULAR | Status: AC
  Filled 2020-01-26: qty 30

## 2020-01-26 MED ORDER — DARBEPOETIN ALFA 100 MCG/0.5ML IJ SOSY: 100.0000 ug | PREFILLED_SYRINGE | INTRAMUSCULAR | Status: DC

## 2020-01-26 MED ORDER — SODIUM CHLORIDE 0.9 % IV SOLN
INTRAVENOUS | Status: DC | PRN
  Administered 2020-01-26: 500 mL

## 2020-01-26 MED ORDER — FENTANYL CITRATE (PF) 100 MCG/2ML IJ SOLN
INTRAMUSCULAR | Status: DC | PRN
Start: 2020-01-26 — End: 2020-01-26
  Administered 2020-01-26 (×2): 25 ug via INTRAVENOUS

## 2020-01-26 MED ORDER — LIDOCAINE HCL (CARDIAC) PF 100 MG/5ML IV SOSY
PREFILLED_SYRINGE | INTRAVENOUS | Status: DC | PRN
  Administered 2020-01-26: 30 mg via INTRAVENOUS

## 2020-01-26 MED ORDER — CALCITRIOL 0.5 MCG PO CAPS
ORAL_CAPSULE | ORAL | Status: AC
  Administered 2020-01-26: 2.5 ug via ORAL
  Filled 2020-01-26: qty 5

## 2020-01-26 MED ORDER — HEPARIN SODIUM (PORCINE) 1000 UNIT/ML IJ SOLN
INTRAMUSCULAR | Status: DC | PRN
  Administered 2020-01-26: 3000 [IU] via INTRAVENOUS

## 2020-01-26 MED ORDER — 0.9 % SODIUM CHLORIDE (POUR BTL) OPTIME
TOPICAL | Status: DC | PRN
  Administered 2020-01-26: 1000 mL

## 2020-01-26 MED ORDER — LIDOCAINE-EPINEPHRINE (PF) 1 %-1:200000 IJ SOLN
INTRAMUSCULAR | Status: DC | PRN
  Administered 2020-01-26: 4 mL

## 2020-01-26 MED ORDER — PHENYLEPHRINE HCL-NACL 10-0.9 MG/250ML-% IV SOLN
INTRAVENOUS | Status: DC | PRN
  Administered 2020-01-26: 50 ug/min via INTRAVENOUS

## 2020-01-26 MED ORDER — PROPOFOL 500 MG/50ML IV EMUL
INTRAVENOUS | Status: DC | PRN
  Administered 2020-01-26: 50 ug/kg/min via INTRAVENOUS

## 2020-01-26 MED ORDER — APIXABAN 2.5 MG PO TABS
2.5000 mg | ORAL_TABLET | Freq: Two times a day (BID) | ORAL | Status: DC
  Administered 2020-01-27 – 2020-01-31 (×9): 2.5 mg via ORAL
  Filled 2020-01-26 (×9): qty 1

## 2020-01-26 MED ORDER — PHENYLEPHRINE HCL (PRESSORS) 10 MG/ML IV SOLN
INTRAVENOUS | Status: DC | PRN
  Administered 2020-01-26: 100 ug via INTRAVENOUS

## 2020-01-26 SURGICAL SUPPLY — 44 items
BAG BANDED W/RUBBER/TAPE 36X54 (MISCELLANEOUS) ×3 IMPLANT
BAG EQP BAND 135X91 W/RBR TAPE (MISCELLANEOUS) ×1
BALLN MUSTANG 8X60X75 (BALLOONS) ×3
BALLOON MUSTANG 8X60X75 (BALLOONS) ×1 IMPLANT
BNDG COHESIVE 4X5 TAN STRL (GAUZE/BANDAGES/DRESSINGS) ×3 IMPLANT
CANISTER SUCT 3000ML PPV (MISCELLANEOUS) IMPLANT
CHLORAPREP W/TINT 10.5 ML (MISCELLANEOUS) ×3 IMPLANT
COVER DOME SNAP 22 D (MISCELLANEOUS) ×3 IMPLANT
COVER WAND RF STERILE (DRAPES) IMPLANT
DERMABOND ADVANCED (GAUZE/BANDAGES/DRESSINGS) ×2
DERMABOND ADVANCED .7 DNX12 (GAUZE/BANDAGES/DRESSINGS) ×1 IMPLANT
DRAPE BRACHIAL (DRAPES) ×3 IMPLANT
DRSG TEGADERM 2-3/8X2-3/4 SM (GAUZE/BANDAGES/DRESSINGS) ×3 IMPLANT
DRSG TEGADERM 4X4.75 (GAUZE/BANDAGES/DRESSINGS) ×6 IMPLANT
ELECT REM PT RETURN 9FT ADLT (ELECTROSURGICAL)
ELECTRODE REM PT RTRN 9FT ADLT (ELECTROSURGICAL) IMPLANT
GAUZE SPONGE 2X2 8PLY STRL LF (GAUZE/BANDAGES/DRESSINGS) ×1 IMPLANT
GEL ULTRASOUND 8.5O AQUASONIC (MISCELLANEOUS) ×3 IMPLANT
GLOVE BIO SURGEON STRL SZ7.5 (GLOVE) ×3 IMPLANT
GOWN STRL REUS W/ TWL LRG LVL3 (GOWN DISPOSABLE) ×3 IMPLANT
GOWN STRL REUS W/ TWL XL LVL3 (GOWN DISPOSABLE) ×1 IMPLANT
GOWN STRL REUS W/TWL LRG LVL3 (GOWN DISPOSABLE) ×9
GOWN STRL REUS W/TWL XL LVL3 (GOWN DISPOSABLE) ×3
KIT BASIN OR (CUSTOM PROCEDURE TRAY) ×3 IMPLANT
KIT ENCORE 26 ADVANTAGE (KITS) ×3 IMPLANT
KIT TURNOVER KIT B (KITS) ×3 IMPLANT
NEEDLE PERC 18GX7CM (NEEDLE) IMPLANT
NS IRRIG 1000ML POUR BTL (IV SOLUTION) ×3 IMPLANT
PACK ENDO MINOR (CUSTOM PROCEDURE TRAY) ×3 IMPLANT
PAD ARMBOARD 7.5X6 YLW CONV (MISCELLANEOUS) ×6 IMPLANT
SET MICROPUNCTURE 5F STIFF (MISCELLANEOUS) ×3 IMPLANT
SHEATH PINNACLE R/O II 7F 4CM (SHEATH) ×3 IMPLANT
SPONGE GAUZE 2X2 STER 10/PKG (GAUZE/BANDAGES/DRESSINGS) ×2
SPONGE LAP 18X18 X RAY DECT (DISPOSABLE) ×3 IMPLANT
STOPCOCK MORSE 400PSI 3WAY (MISCELLANEOUS) ×3 IMPLANT
SUT MNCRL AB 4-0 PS2 18 (SUTURE) ×3 IMPLANT
SYR 10ML LL (SYRINGE) ×12 IMPLANT
SYR 20ML LL LF (SYRINGE) ×6 IMPLANT
SYR CONTROL 10ML LL (SYRINGE) ×3 IMPLANT
TOWEL GREEN STERILE (TOWEL DISPOSABLE) ×3 IMPLANT
TUBING CIL FLEX 10 FLL-RA (TUBING) ×6 IMPLANT
UNDERPAD 30X36 HEAVY ABSORB (UNDERPADS AND DIAPERS) ×3 IMPLANT
WATER STERILE IRR 1000ML POUR (IV SOLUTION) IMPLANT
WIRE BENTSON .035X145CM (WIRE) ×3 IMPLANT

## 2020-01-26 NOTE — Progress Notes (Signed)
HD tx completed, pt noted with prolonged bleeding post tx, for approximately 2hrs 57min. Dr.Singh notified. IR and vascular aware. Upon evaluation by both teams pt had stopped bleeding. Pt is to be NPO for further evaluation of fistula by vascular. Report given to charge nurse, [place].Pt stable upon transfer.

## 2020-01-26 NOTE — Op Note (Addendum)
    [name] MRN: [id number] DOB: [birth year] Sex: female  01/26/2020 Pre-operative Diagnosis: esrd, bleeding from left arm avf Post-operative diagnosis:  Same Surgeon:  Eda Paschal. Donzetta Matters, MD Procedure Performed: 1.  US guided cannulation of left arm avf  2.  Left arm shuntogram 3.  Left arm graft thrombectomy with 55m balloon angioplasty  Indications: 89old female with end-stage renal disease dialyzing via left arm AV graft.  After dialysis today she had 2 hours of bleeding where pressure was held to obtain hemostasis.  The graft was then very pulsatile on exam.  She has recently undergone intervention approximately 3 weeks ago of her left axillary vein.  She is now indicated for repeat intervention.  Findings: Previous axillary vein intervention appeared similar without any flow-limiting stenosis there were no collateralization around this.  The existing stent in the graft did have significant thrombus burden.  After the patient was heparinized we performed thrombectomy with 8 mm balloon.  We also performed retrograde angiogram which demonstrated strong inflow without any anastomotic stenosis.  Completion demonstrated no residual clot in the graft.  Graft is ready for use.   Procedure:  The patient was identified in the holding area and taken to the operating room where she was placed supine on the operative table and MAC anesthesia was induced.  She was sterilely prepped and draped in the left upper extremity usual fashion antibiotics were administered timeout was called.  Ultrasound was used to identify the graft in the upper extremity towards the arterial anastomosis.  The area was anesthetized 1% lidocaine cannulated micropuncture needle followed by wire and sheath.  Left upper extremity fistulogram was performed which demonstrated significant amount of clot throughout the graft from the sheath more cephalad into the stent.  Previous axillary vein intervention appeared patent  with no collateralization.  With this we exchanged for 7 French sheath over a Bentson wire.  3000 units of heparin were administered.  We used a millimeter balloon to perform balloon angioplasty and mechanical thrombectomy.  After we had ballooned the entire graft from the sheath central we then dilated the balloon and perform retrograde fistulogram which demonstrated patency with no clot there.  After this there was a very strong thrill throughout the graft and completion angiography demonstrated no residual thrombus in the graft or stent.  Satisfied we remove the wire and sheath and then sutured our cannulation site.  She tolerated procedure without any complication.  All counts were correct at completion.  EBL: 20 cc    Kamren Heskett C. CDonzetta Matters MD [facility] [place]onOffice: [phone]: [phone]

## 2020-01-26 NOTE — Consult Note (Signed)
Hospital Consult    Reason for Consult:  Bleeding left arm avf Referring Physician:  Dr. Justin Mend MRN #:  [id number]  History of Present Illness: This is a 69 y.o. female with esrd, recent pta of left arm avf. Now having bleeding following hd today.  Past Medical History:  Diagnosis Date  . Acute delirium 11/18/2014  . Acute encephalopathy   . Acute on chronic respiratory failure with hypoxia ([place]) 09/09/2016  . Acute respiratory failure with hypoxia ([place]) 11/29/2015  . Anemia in chronic kidney disease 12/09/2014  . Anxiety   . Arthritis   . Benign hypertension   . Bipolar affective disorder ([place]) 12/09/2014  . CAD (coronary artery disease), native coronary artery with 2 stents  11/17/2014  . Charcot foot due to diabetes mellitus ([place])   . Chronic combined systolic and diastolic CHF (congestive heart failure) ([place]) 11/18/2014  . Closed left ankle fracture 11/17/2014  . Confusion 01/21/2015  . Depression   . Diabetes mellitus without complication ([place])   . End stage renal disease on dialysis ([place])   . Fracture dislocation of ankle 11/17/2014  . GERD (gastroesophageal reflux disease)   . Heart murmur   . History of blood transfusion   . History of bronchitis   . History of pneumonia   . Hyperlipidemia 03/26/2015  . Hypertension associated with diabetes ([place]) 11/18/2014  . Hypertensive heart/renal disease with failure ([place]) 12/09/2014  . Hypokalemia 11/17/2014  . Multiple falls 01/21/2015  . Obesity 11/17/2014  . Onychomycosis 10/07/2016  . Right leg DVT ([place]) 12/05/2015  . SIRS (systemic inflammatory response syndrome) ([place]) 08/08/2016  . Sleep apnea   . Unstable angina ([place]) 12/26/2017  . Ventricular tachycardia [facility])     Past Surgical History:  Procedure Laterality Date  . A/V FISTULAGRAM N/A 01/03/2020   Procedure: A/V FISTULAGRAM;  Surgeon: Serafina Mitchell, MD;  Location: [place] CV LAB;  Service: Cardiovascular;  Laterality: N/A;  . ABDOMINAL HYSTERECTOMY    . ANKLE CLOSED  REDUCTION N/A 11/17/2014   Procedure: CLOSED REDUCTION ANKLE;  Surgeon: Earlie Server, MD;  Location: [place];  Service: Orthopedics;  Laterality: N/A;  . ANKLE FUSION Left 03/16/2015   Procedure: Left Tibiocalcaneal Fusion;  Surgeon: Newt Minion, MD;  Location: [place];  Service: Orthopedics;  Laterality: Left;  . APPLICATION OF WOUND VAC Right 08/06/2016   Procedure: APPLICATION OF PREVENA WOUND VAC;  Surgeon: Newt Minion, MD;  Location: [place];  Service: Orthopedics;  Laterality: Right;  . AV FISTULA PLACEMENT Left [id number]   done at [place]     2 stent   . CHOLECYSTECTOMY    . CORONARY STENT PLACEMENT    . FOOT ARTHRODESIS Right 08/06/2016   Procedure: Right Foot Fusion Lisfranc Joint;  Surgeon: Newt Minion, MD;  Location: [place];  Service: Orthopedics;  Laterality: Right;  . HARDWARE REMOVAL Left 03/16/2015   Procedure: Removal Hardware Left Ankle;  Surgeon: Newt Minion, MD;  Location: [place];  Service: Orthopedics;  Laterality: Left;  . IR AV DIALY SHUNT INTRO NEEDLE/INTRACATH INITIAL W/PTA/IMG LEFT  01/05/2018  . IR US GUIDE VASC ACCESS LEFT  01/05/2018  . LEFT HEART CATH AND CORONARY ANGIOGRAPHY N/A 12/28/2017   Procedure: LEFT HEART CATH AND CORONARY ANGIOGRAPHY;  Surgeon: Burnell Blanks, MD;  Location: [place] [facility];  Service: Cardiovascular;  Laterality: N/A;  . ORIF ANKLE FRACTURE Left 11/20/2014   Procedure: OPEN REDUCTION INTERNAL FIXATION (ORIF) ANKLE FRACTURE;  Surgeon: Christia Reading  Maryla Morrow, MD;  Location: [place];  Service: Orthopedics;  Laterality: Left;  . PERIPHERAL VASCULAR BALLOON ANGIOPLASTY  01/03/2020   Procedure: PERIPHERAL VASCULAR BALLOON ANGIOPLASTY;  Surgeon: Serafina Mitchell, MD;  Location: [place] [facility];  Service: Cardiovascular;;  . TONSILLECTOMY      No Known Allergies  Prior to Admission medications   Medication Sig Start Date End Date Taking? Authorizing Provider  acetaminophen (TYLENOL) 325 MG tablet Take 325-975  mg by mouth every 6 (six) hours as needed for mild pain or headache.   Yes [provider]  albuterol (VENTOLIN HFA) 108 (90 Base) MCG/ACT inhaler Inhale 1-2 puffs into the lungs every 6 (six) hours as needed for wheezing or shortness of breath.   Yes [provider]  ammonium lactate (LAC-HYDRIN) 12 % lotion Apply 1 application topically 2 (two) times daily as needed (for itching/irritation).    Yes [provider]  ARIPiprazole (ABILIFY) 10 MG tablet Take 10 mg by mouth daily. Take with 2mg  tablet for a total of 12mg    Yes [provider]  ARIPiprazole (ABILIFY) 2 MG tablet Take 2 mg by mouth daily. Take with 10mg  tablet for a total of 12mg    Yes [provider]  atorvastatin (LIPITOR) 40 MG tablet Take 20 mg by mouth at bedtime.    Yes [provider]  dextrose (GLUTOSE) 40 % GEL Take 1 Tube by mouth once as needed (for blood sugar less than 60).    Yes [provider]  ELIQUIS 5 MG TABS tablet Take 2.5 mg by mouth 2 (two) times daily. 10/21/18  Yes [provider]  gabapentin (NEURONTIN) 100 MG capsule Take 100 mg by mouth 2 (two) times daily.   Yes [provider]  insulin detemir (LEVEMIR) 100 UNIT/ML injection Inject 0.08 mLs (8 Units total) into the skin at bedtime. Patient taking differently: Inject 20 Units into the skin 2 (two) times daily.  09/15/16  Yes Oswald Hillock, MD  insulin lispro (HUMALOG) 100 UNIT/ML injection Inject 3-15 Units into the skin 3 (three) times daily before meals. Pt uses as needed per sliding scale:    150-200:  3  units 201-250:  5 units 251-300:  8 units 301-350:  10 units 351-400:  12 units and call MD 400+ 15 units   Yes [provider]  isosorbide mononitrate (IMDUR) 60 MG 24 hr tablet Take 60 mg by mouth daily.    Yes [provider]  Melatonin 5 MG TABS Take 10 mg by mouth at bedtime.    Yes [provider]  metoprolol tartrate 75 MG TABS Take 75 mg  by mouth 2 (two) times daily. 04/17/19  Yes Milus Banister C, DO  multivitamin (RENA-VIT) TABS tablet Take 1 tablet by mouth at bedtime.  12/11/17  Yes [provider]  nitroGLYCERIN (NITROSTAT) 0.4 MG SL tablet Place 0.4 mg under the tongue every 5 (five) minutes as needed for chest pain.   Yes [provider]  pantoprazole (PROTONIX) 20 MG tablet Take 20 mg by mouth 2 (two) times daily.    Yes [provider]  amLODipine (NORVASC) 5 MG tablet Take 1 tablet (5 mg total) by mouth daily. Patient not taking: Reported on 01/11/2020 09/18/18   Lars Mage, MD  aspirin 81 MG chewable tablet Chew 1 tablet (81 mg total) by mouth daily. Patient not taking: Reported on 01/11/2020 12/30/17   Asencion Noble, MD  AURYXIA 1 GM 210 MG(Fe) tablet Take 210 mg  by mouth daily. Patient not taking: Reported on 01/11/2020 02/22/19   [provider]  gabapentin (NEURONTIN) 300 MG capsule Take 1 capsule (300 mg total) by mouth 2 (two) times daily. Patient not taking: Reported on 01/11/2020 06/24/19   Suzan Slick, NP  HYDROcodone-acetaminophen (NORCO) 5-325 MG tablet Take 1 tablet by mouth every 6 (six) hours as needed. Patient not taking: Reported on 01/11/2020 11/18/19   Persons, [place]ely [place], [place]    Social History   Socioeconomic History  . Marital status: Divorced    Spouse name: Not on file  . Number of children: Not on file  . Years of education: Not on file  . Highest education level: Not on file  Occupational History  . Not on file  Tobacco Use  . Smoking status: Current Every Day Smoker    Packs/day: 1.00    Types: Cigarettes  . Smokeless tobacco: Never Used  Substance and Sexual Activity  . Alcohol use: No  . Drug use: No  . Sexual activity: Never  Other Topics Concern  . Not on file  Social History Narrative  . Not on file   Social Determinants of Health   Financial Resource Strain:   . Difficulty of Paying Living Expenses: Not on file  Food Insecurity:    . Worried About Charity fundraiser in the Last Year: Not on file  . Ran Out of Food in the Last Year: Not on file  Transportation Needs:   . Lack of Transportation (Medical): Not on file  . Lack of Transportation (Non-Medical): Not on file  Physical Activity:   . Days of Exercise per Week: Not on file  . Minutes of Exercise per Session: Not on file  Stress:   . Feeling of Stress : Not on file  Social Connections:   . Frequency of Communication with Friends and Family: Not on file  . Frequency of Social Gatherings with Friends and Family: Not on file  . Attends Religious Services: Not on file  . Active Member of Clubs or Organizations: Not on file  . Attends Archivist Meetings: Not on file  . Marital Status: Not on file  Intimate Partner Violence:   . Fear of Current or Ex-Partner: Not on file  . Emotionally Abused: Not on file  . Physically Abused: Not on file  . Sexually Abused: Not on file    Family History  Family history unknown: Yes    ROS: bleeding left arm  Physical Examination  Vitals:   01/26/20 1300 01/26/20 1326  BP: (!) 160/62 (!) 142/70  Pulse: 74 79  Resp:  16  Temp:    SpO2: 95% 94%   Body mass index is 25.06 kg/m.  General:  nad HENT: WNL, normocephalic Pulmonary: normal non-labored breathing Cardiac: pulsatility in left arm avf Abdomen: soft Extremities: bleeding left arm avf   CBC    Component Value Date/Time   WBC 6.5 01/26/2020 0612   RBC 2.32 (L) 01/26/2020 0612   HGB 7.7 (L) 01/26/2020 0612   HCT 24.0 (L) 01/26/2020 0612   PLT 168 01/26/2020 0612   MCV 103.4 (H) 01/26/2020 0612   MCH 33.2 01/26/2020 0612   MCHC 32.1 01/26/2020 0612   RDW 17.0 (H) 01/26/2020 0612   LYMPHSABS 0.9 01/15/2020 0430   MONOABS 0.7 01/15/2020 0430   EOSABS 0.0 01/15/2020 0430   BASOSABS 0.0 01/15/2020 0430    BMET    Component Value Date/Time   NA 136 01/26/2020 0612  NA 140 08/25/2016 0000   K 3.5 01/26/2020 0612   CL 97 (L)  01/26/2020 0612   CO2 26 01/26/2020 0612   GLUCOSE 132 (H) 01/26/2020 0612   BUN 28 (H) 01/26/2020 0612   BUN 31 (A) 08/25/2016 0000   CREATININE 7.13 (H) 01/26/2020 0612   CALCIUM 8.8 (L) 01/26/2020 0612   GFRNONAA 5 (L) 01/26/2020 0612   GFRAA 6 (L) 01/26/2020 0612    COAGS: Lab Results  Component Value Date   INR 1.4 (H) 01/08/2020   INR 1.6 (H) 09/16/2018   INR 1.15 09/08/2016     Non-Invasive Vascular Imaging:   I reviewed recent fistulogram images   ASSESSMENT/PLAN: This is a 69 y.o. female with esrd and bleeding from left arm avf. Plan now for fistulogram to re evaluate fistula. Likely will need repeat pta and/or stenting. Last bite 0845. Will make npo and plan for fistulogram left arm today in OR.  Darin Redmann C. Donzetta Matters, MD [facility] [place] Office: [phone] Pager: [phone]

## 2020-01-26 NOTE — OR Nursing (Signed)
Large dark brown bowel movement noted when moving patient to OR table in OR #16. Patient cleaned and gown changed before procedure.   Linwood Dibbles, BSN, RN, CNOR,

## 2020-01-26 NOTE — Progress Notes (Signed)
Patient's daughter, Judson Roch, notified about patient's transfer to 5N.

## 2020-01-26 NOTE — Progress Notes (Signed)
OT Cancellation Note  Patient Details Name: Robin [name] MRN: [id number] DOB: 07/06/1950   Cancelled Treatment:    Reason Eval/Treat Not Completed: Patient at procedure or test/ unavailable (HD. Will return as schedule allows.)  [place], OTR/L Acute Rehab Pager: [phone] Office: [phone] 01/26/2020, 12:04 PM

## 2020-01-26 NOTE — Anesthesia Postprocedure Evaluation (Signed)
Anesthesia Post Note  Patient: Robin [name]  Procedure(s) Performed: FISTULOGRAM (Left Arm Upper) LEFT ARM GRAPH THROMBECTOMY WITH BALLON ANGIOPLASTY (Left Arm Upper)     Patient location during evaluation: PACU Anesthesia Type: MAC Level of consciousness: awake and alert Pain management: pain level controlled Vital Signs Assessment: post-procedure vital signs reviewed and stable Respiratory status: spontaneous breathing, nonlabored ventilation, respiratory function stable and patient connected to nasal cannula oxygen Cardiovascular status: stable and blood pressure returned to baseline Postop Assessment: no apparent nausea or vomiting Anesthetic complications: no   No complications documented.  Last Vitals:  Vitals:   01/26/20 1500 01/26/20 2000  BP: 139/61 (!) 162/61  Pulse: 77   Resp: 16   Temp: 37.6 C 37.2 C  SpO2: 95%     Last Pain:  Vitals:   01/26/20 2000  TempSrc: Oral  PainSc: 0-No pain                 Catalina Gravel

## 2020-01-26 NOTE — Treatment Plan (Signed)
Prolonged bleeding from AVF, no resolution with holding pressure. IR consult placed for eval (+/- tunneled dialysis catheter if needed), call placed for IR and message left. Informing primary for assistance.  Gean Quint, MD [facility]

## 2020-01-26 NOTE — Anesthesia Preprocedure Evaluation (Addendum)
Anesthesia Evaluation  Patient identified by MRN, date of birth, ID band Patient awake    Reviewed: Allergy & Precautions, NPO status , Patient's Chart, lab work & pertinent test results  History of Anesthesia Complications Negative for: history of anesthetic complications  Airway Mallampati: III  TM Distance: >3 FB Neck ROM: Full    Dental  (+) Dental Advisory Given   Pulmonary sleep apnea , Recent URI , Current Smoker and Patient abstained from smoking.,  Covid +     + decreased breath sounds      Cardiovascular hypertension, + angina + CAD and +CHF   Rhythm:Regular  1. Left ventricular ejection fraction, by visual estimation, is 50 to  55%. The left ventricle has low normal function. There is moderately  increased left ventricular hypertrophy.  2. Left ventricular diastolic parameters are consistent with Grade II  diastolic dysfunction (pseudonormalization).  3. The left ventricle has no regional wall motion abnormalities.  4. Global right ventricle has normal systolic function.The right  ventricular size is normal. No increase in right ventricular wall  thickness.  5. Left atrial size was mildly dilated.  6. Right atrial size was mild-moderately dilated.  7. Small pericardial effusion.  8. The mitral valve is normal in structure. Trace mitral valve  regurgitation.  9. The tricuspid valve is normal in structure. Tricuspid valve  regurgitation is trivial.  10. The aortic valve is tricuspid. Aortic valve regurgitation is not  visualized. No evidence of aortic valve sclerosis or stenosis.  11. The pulmonic valve was grossly normal. Pulmonic valve regurgitation is  not visualized.  12. The inferior vena cava is normal in size with greater than 50%  respiratory variability, suggesting right atrial pressure of 3 mmHg.    Prox RCA lesion is 20% stenosed.  Ost Ramus to Ramus lesion is 40% stenosed.  Ost Cx to Prox  Cx lesion is 65% stenosed.  Prox Cx to Mid Cx lesion is 20% stenosed.  Ost LAD to Mid LAD lesion is 25% stenosed.  Ost 1st Diag lesion is 50% stenosed.  Ost 2nd Diag lesion is 70% stenosed.  Mid RCA to Dist RCA lesion is 30% stenosed.  The left ventricular systolic function is normal.  LV end diastolic pressure is normal.  The left ventricular ejection fraction is 45-50% by visual estimate.  There is no mitral valve regurgitation.   1. Mild non-obstructive disease in the proximal and mid LAD 2. Moderate, heavily calcified proximal Circumflex stenosis. This appears to be an eccentric lesion and is not felt to be flow limiting. The moderate caliber intermediate branch arises from this lesion. The intermediate branch has a proximal stent that is patent with mild to moderate stent restenosis 3. The RCA is a moderate caliber co-dominant vessel with mild disease in the mid and distal segments.  4. Inferior wall hypokinesis with mild LV systolic dysfunction.     Neuro/Psych  Headaches, PSYCHIATRIC DISORDERS Anxiety Depression Bipolar Disorder  Neuromuscular disease    GI/Hepatic GERD  ,  Endo/Other  diabetes  Renal/GU CRF and ESRFRenal disease     Musculoskeletal   Abdominal   Peds  Hematology   Anesthesia Other Findings   Reproductive/Obstetrics                            Anesthesia Physical Anesthesia Plan  ASA: IV  Anesthesia Plan: MAC   Post-op Pain Management:    Induction: Intravenous  PONV Risk Score and Plan: 1  and Treatment may vary due to age or medical condition and Propofol infusion  Airway Management Planned: Nasal Cannula  Additional Equipment: None  Intra-op Plan:   Post-operative Plan:   Informed Consent: I have reviewed the patients History and Physical, chart, labs and discussed the procedure including the risks, benefits and alternatives for the proposed anesthesia with the patient or authorized representative who  has indicated his/her understanding and acceptance.     Dental advisory given  Plan Discussed with: CRNA and Surgeon  Anesthesia Plan Comments:         Anesthesia Quick Evaluation

## 2020-01-26 NOTE — Transfer of Care (Signed)
Immediate Anesthesia Transfer of Care Note  Patient: Robin [name]  Procedure(s) Performed: FISTULOGRAM (Left Arm Upper) LEFT ARM GRAPH THROMBECTOMY WITH BALLON ANGIOPLASTY (Left Arm Upper)  Patient Location: ICU  Anesthesia Type:MAC  Level of Consciousness: awake, alert , oriented and patient cooperative  Airway & Oxygen Therapy: Patient Spontanous Breathing and Patient connected to nasal cannula oxygen  Post-op Assessment: Report given to RN, Post -op Vital signs reviewed and stable and Patient moving all extremities X 4  Post vital signs: Reviewed and stable  Last Vitals:  Vitals Value Taken Time  BP    Temp    Pulse    Resp    SpO2      Last Pain:  Vitals:   01/26/20 1500  TempSrc: Oral  PainSc:          Complications: No complications documented.

## 2020-01-26 NOTE — Procedures (Signed)
I was present at this dialysis session. I have reviewed the session itself and made appropriate changes.   2K UF goal should achieve. Tol well.  K 3.5.  Hb trending down, inc ESA for Sat. High ferritin, no Fe.   Filed Weights   01/24/20 1111 01/26/20 0516 01/26/20 0735  Weight: 67.9 kg 69.4 kg 68.3 kg    Recent Labs  Lab 01/26/20 0612  NA 136  K 3.5  CL 97*  CO2 26  GLUCOSE 132*  BUN 28*  CREATININE 7.13*  CALCIUM 8.8*  PHOS 3.5    Recent Labs  Lab 01/21/20 0119 01/26/20 0612  WBC 5.0 6.5  HGB 8.9* 7.7*  HCT 28.6* 24.0*  MCV 101.4* 103.4*  PLT 289 168    Scheduled Meds: . apixaban  2.5 mg Oral BID  . vitamin C  500 mg Oral Daily  . aspirin  81 mg Oral Daily  . atorvastatin  20 mg Oral QHS  . calcitRIOL  2.5 mcg Oral Q T,Th,Sa-HD  . Chlorhexidine Gluconate Cloth  6 each Topical Q0600  . darbepoetin (ARANESP) injection - DIALYSIS  40 mcg Intravenous Q Sat-HD  . insulin aspart  0-6 Units Subcutaneous TID WC  . isosorbide mononitrate  30 mg Oral Daily  . melatonin  10 mg Oral QHS  . metoprolol tartrate  75 mg Oral BID  . multivitamin  1 tablet Oral QHS  . pantoprazole  20 mg Oral BID  . zinc sulfate  220 mg Oral Daily   Continuous Infusions: PRN Meds:.acetaminophen, albuterol, chlorpheniramine-HYDROcodone, guaiFENesin-dextromethorphan, loperamide, ondansetron (ZOFRAN) IV, senna-docusate   Pearson Grippe  MD 01/26/2020, 11:31 AM

## 2020-01-26 NOTE — Anesthesia Procedure Notes (Signed)
Procedure Name: MAC Date/Time: 01/26/2020 6:10 PM Performed by: Eligha Bridegroom, CRNA Pre-anesthesia Checklist: Patient identified, Emergency Drugs available, Suction available, Patient being monitored and Timeout performed Patient Re-evaluated:Patient Re-evaluated prior to induction Oxygen Delivery Method: Nasal cannula Preoxygenation: Pre-oxygenation with 100% oxygen Induction Type: IV induction

## 2020-01-26 NOTE — Progress Notes (Signed)
PROGRESS NOTE                                                                                                                                                                                                             Patient Demographics:    Robin [name], is a 69 y.o. female, DOB - 03-30-1951, NTI:[id number]  Outpatient Primary MD for the patient is [place] date - 01/10/2020   LOS - 16  No chief complaint on file.      Brief Narrative: Patient is a 69 y.o. female with PMHx of ESRD on HD TTS, dementia, HTN, DM-2-presented to the ED with shortness of breath-acute hypoxic respiratory failure secondary to COVID-19 pneumonia.  COVID-19 vaccinated status: Unvaccinated  Significant Events: 8/17>> Admit to [facility] for hypoxia secondary to COVID-19.  Significant studies: 8/15>> CT head: No acute intracranial process. 8/17>>Chest x-ray: Chronic central vascular congestion and interstitial prominence-no acute airspace disease.  COVID-19 medications: Steroids: 8/17>> 8/23 Remdesivir: 8/17>> 8/21  Antibiotics: None  Microbiology data: 8/17 >>blood culture: No growth  Procedures: None  Consults: None  DVT prophylaxis: apixaban (ELIQUIS) tablet 2.5 mg Start: 01/10/20 2359 apixaban (ELIQUIS) tablet 2.5 mg     Subjective:   No major issues overnight-remains stable on room air.  Unfortunately later this morning-had bleeding from her AV fistula site.   Assessment  & Plan :   Acute Hypoxic Resp Failure due to Covid 19 Viral pneumonia: Significantly improved-has finished a course of steroids and Remdesivir.  Continue supportive care.  Fever: afebrile  O2 requirements:  SpO2: 94 % O2 Flow Rate (L/min): 2 L/min   COVID-19 Labs: No results for input(s): DDIMER, FERRITIN, LDH, CRP in the last 72 hours.     Component Value Date/Time   BNP 4,401.9 (H) 01/15/2020 0430    No results for  input(s): PROCALCITON in the last 168 hours.  Lab Results  Component Value Date   [place] NEGATIVE 01/03/2020   [place] NEGATIVE 10/01/2019   [place] NEGATIVE 04/14/2019    ESRD: On HD TTS-nephrology following.  Bleeding AV fistula:  IR consulted by nephrology-subsequently seen by vascular surgery with plans for fistulogram  later today.  Anemia: Likely due to acute illness-superimposed on anemia of ESRD.  Hemoglobin 7.7 this morning-this was prior to bleeding AV fistula.  Repeat CBC tomorrow-if significantly decreased-may  need transfusion.  Continue to follow closely.  CAD: No anginal symptoms-continue aspirin, statin, beta-blocker and Imdur.  History of pulmonary embolism: On Eliquis  HLD: Continue statin  DM-2 (A1c 8.1 on 8/17): CBG stable-continue SSI  Recent Labs    01/25/20 1638 01/25/20 2127 01/26/20 0620  GLUCAP 120* 106* 73    ABG:    Component Value Date/Time   PHART 7.403 (H) 02/27/2008 1450   PCO2ART 38.6 02/27/2008 1450   PO2ART 65.4 (L) 02/27/2008 1450   HCO3 23.5 02/27/2008 1450   TCO2 26 01/08/2020 2021   ACIDBASEDEF 0.6 02/27/2008 1450   O2SAT 91.3 02/27/2008 1450    Vent Settings: N/A    Condition - Stable  Family Communication  : Daughter Judson Roch [phone]) updated over the phone on 9/2  Code Status :  Full Code  Diet :  Diet Order            Diet NPO time specified Except for: Sips with Meds  Diet effective now                  Disposition Plan  :   Status is: Inpatient  Remains inpatient appropriate because:Inpatient level of care appropriate due to severity of illness   Dispo: The patient is from: Home              Anticipated d/c is to: SNF              Anticipated d/c date is: > 3 days              Patient currently is not medically stable to d/c.   Barriers to discharge: Awaiting SNF bed-but now with bleeding from AV fistula-vascular plans to take the patient to the OR later today.  Antimicorbials  :     Anti-infectives (From admission, onward)   Start     Dose/Rate Route Frequency Ordered Stop   01/11/20 1000  remdesivir 100 mg in sodium chloride 0.9 % 100 mL IVPB       "Followed by" Linked Group Details   100 mg 200 mL/hr over 30 Minutes Intravenous Every 24 hours 01/10/20 2129 01/14/20 1918   01/11/20 1000  remdesivir 100 mg in sodium chloride 0.9 % 100 mL IVPB  Status:  Discontinued       "Followed by" Linked Group Details   100 mg 200 mL/hr over 30 Minutes Intravenous Daily 01/10/20 2321 01/10/20 2339   01/10/20 2321  remdesivir 200 mg in sodium chloride 0.9% 250 mL IVPB  Status:  Discontinued       "Followed by" Linked Group Details   200 mg 580 mL/hr over 30 Minutes Intravenous Once 01/10/20 2321 01/10/20 2339   01/10/20 2200  remdesivir 200 mg in sodium chloride 0.9% 250 mL IVPB       "Followed by" Linked Group Details   200 mg 580 mL/hr over 30 Minutes Intravenous Once 01/10/20 2129 01/11/20 0135      Inpatient Medications  Scheduled Meds: . apixaban  2.5 mg Oral BID  . vitamin C  500 mg Oral Daily  . aspirin  81 mg Oral Daily  . atorvastatin  20 mg Oral QHS  . calcitRIOL  2.5 mcg Oral Q T,Th,Sa-HD  . Chlorhexidine Gluconate Cloth  6 each Topical Q0600  . [START ON 01/28/2020] darbepoetin (ARANESP) injection - DIALYSIS  100 mcg Intravenous Q Sat-HD  . insulin aspart  0-6 Units Subcutaneous TID WC  . isosorbide mononitrate  30 mg Oral Daily  .  melatonin  10 mg Oral QHS  . metoprolol tartrate  75 mg Oral BID  . multivitamin  1 tablet Oral QHS  . pantoprazole  20 mg Oral BID  . zinc sulfate  220 mg Oral Daily   Continuous Infusions: PRN Meds:.acetaminophen, albuterol, chlorpheniramine-HYDROcodone, guaiFENesin-dextromethorphan, loperamide, ondansetron (ZOFRAN) IV, senna-docusate   Time Spent in minutes  25  See all Orders from today for further details   Jeoffrey Massed M.D on 01/26/2020 at 2:56 PM  To page go to [email or web address] - use universal password  [facility] -  Office  [phone]    Objective:   Vitals:   01/26/20 1200 01/26/20 1230 01/26/20 1300 01/26/20 1326  BP: (!) 164/77 (!) 160/61 (!) 160/62 (!) 142/70  Pulse: 67 75 74 79  Resp:    16  Temp:      TempSrc:      SpO2: 94% 93% 95% 94%  Weight:      Height:        Wt Readings from Last 3 Encounters:  01/26/20 68.3 kg  01/03/20 69.9 kg  11/23/19 69.9 kg     Intake/Output Summary (Last 24 hours) at 01/26/2020 1456 Last data filed at 01/26/2020 1030 Gross per 24 hour  Intake 200 ml  Output 2000 ml  Net -1800 ml     Physical Exam Gen Exam:Alert awake-not in any distress HEENT:atraumatic, normocephalic Chest: B/L clear to auscultation anteriorly CVS:S1S2 regular Abdomen:soft non tender, non distended Extremities:no edema Neurology: Non focal Skin: no rash   Data Review:    CBC Recent Labs  Lab 01/21/20 0119 01/26/20 0612  WBC 5.0 6.5  HGB 8.9* 7.7*  HCT 28.6* 24.0*  PLT 289 168  MCV 101.4* 103.4*  MCH 31.6 33.2  MCHC 31.1 32.1  RDW 16.8* 17.0*    Chemistries  Recent Labs  Lab 01/21/20 0119 01/26/20 0612  NA 137 136  K 3.9 3.5  CL 96* 97*  CO2 29 26  GLUCOSE 113* 132*  BUN 14 28*  CREATININE 5.14* 7.13*  CALCIUM 9.7 8.8*   ------------------------------------------------------------------------------------------------------------------ No results for input(s): CHOL, HDL, LDLCALC, TRIG, CHOLHDL, LDLDIRECT in the last 72 hours.  Lab Results  Component Value Date   HGBA1C 8.1 (H) 01/10/2020   ------------------------------------------------------------------------------------------------------------------ No results for input(s): TSH, T4TOTAL, T3FREE, THYROIDAB in the last 72 hours.  Invalid input(s): FREET3 ------------------------------------------------------------------------------------------------------------------ No results for input(s): VITAMINB12, FOLATE, FERRITIN, TIBC, IRON, RETICCTPCT in the last 72  hours.  Coagulation profile No results for input(s): INR, PROTIME in the last 168 hours.  No results for input(s): DDIMER in the last 72 hours.  Cardiac Enzymes No results for input(s): CKMB, TROPONINI, MYOGLOBIN in the last 168 hours.  Invalid input(s): CK ------------------------------------------------------------------------------------------------------------------    Component Value Date/Time   BNP 4,401.9 (H) 01/15/2020 0430    Micro Results No results found for this or any previous visit (from the past 240 hour(s)).  Radiology Reports CT HEAD WO CONTRAST  Result Date: 01/08/2020 CLINICAL DATA:  Mental status change EXAM: CT HEAD WITHOUT CONTRAST TECHNIQUE: Contiguous axial images were obtained from the base of the skull through the vertex without intravenous contrast. COMPARISON:  CT head dated 09/30/2019 FINDINGS: Brain: No evidence of acute infarction, hemorrhage, hydrocephalus, extra-axial collection or mass lesion/mass effect. There is mild cerebral volume loss with associated ex vacuo dilatation. Periventricular white matter hypoattenuation likely represents chronic small vessel ischemic disease. Vascular: There are vascular calcifications in the carotid siphons. Skull: Normal. Negative for fracture or focal lesion. Sinuses/Orbits: No acute  finding. Other: None. IMPRESSION: No acute intracranial process. Electronically Signed   By: Zerita Boers M.D.   On: 01/08/2020 20:42   PERIPHERAL VASCULAR CATHETERIZATION  Result Date: 01/03/2020 [name] MRN: [id number] DOB: [birth year] Sex: female 01/03/2020 Pre-operative Diagnosis: Bleeding from left upper arm dialysis graft Post-operative diagnosis:  Same Surgeon:  Annamarie Major Procedure Performed:  1.  Ultrasound guided access, left upper arm dialysis graft  2.  Shuntogram  3.  Balloon venoplasty, left axillary vein (peripheral)  4.  Conscious sedation, 25 minutes  Indications: The patient came to the ER today for the  second time in 3 days for bleeding from her dialysis graft.  I have recommended shuntogram to evaluate for central stenosis. Procedure:  The patient was identified in the holding area and taken to room 8.  The patient was then placed supine on the table and prepped and draped in the usual sterile fashion.  A time out was called.  Conscious sedation was administered with the use of IV fentanyl and Versed under continuous physician and nurse monitoring.  Heart rate, blood pressure, and oxygen saturations were continuously monitored.  Total sedation time was 25 minutes ultrasound was used to evaluate the fistula.  The vein was patent and compressible.  A digital ultrasound image was acquired.  The fistula was then accessed under ultrasound guidance using a micropuncture needle.  An 018 wire was then asvanced without resistance and a micropuncture sheath was placed.  Contrast injections were then performed through the sheath. Findings: The central venous system is widely patent.  A stent is visualized at the venous outflow tract.  Just beyond the stent there is a 80% stenosis within the axillary vein.  The arterial venous anastomosis is widely patent.  Intervention: After the above images were acquired the decision was made to proceed with intervention.  A 7 French sheath was inserted over a Bentson wire.  I used a Glidewire to cross the lesion.  I then proceeded with balloon venoplasty using an 8 x 40 Mustang balloon.  Follow-up imaging revealed improved but suboptimal result and so I upsized to a 12 x 40 Mustang balloon.  This was taken to 20 atm.  Follow-up imaging revealed complete resolution of the stenosis.  The access site was closed with a 4-0 Monocryl. Impression:  #1  Greater than 80% stenosis just beyond the venous outflow stent treated using an 8 mm followed by 12 mm balloon with residual stenosis less than 10%.  Theotis Burrow, M.D., FACS [facility] Office: [phone]  Pager:  [phone]  DG Chest Port 1 View  Result Date: 01/10/2020 CLINICAL DATA:  Hypoxia, shortness of breath, COVID-19 positive EXAM: PORTABLE CHEST 1 VIEW COMPARISON:  09/30/2019 FINDINGS: Single frontal view of the chest demonstrates persistent enlargement of the cardiac silhouette. There is chronic interstitial prominence and mild central vascular congestion. No focal airspace disease, effusion, or pneumothorax. No acute bony abnormalities. IMPRESSION: 1. Chronic central vascular congestion and interstitial prominence. No acute airspace disease. Electronically Signed   By: [name] M.D.   On: 01/10/2020 16:10

## 2020-01-26 NOTE — Progress Notes (Signed)
Pt is s/p shuntogram and thrombectomy today. D/w Dr Donzetta Matters and ok to resume the low dose apixaban tomorrow.   Onnie Boer, PharmD, BCIDP, AAHIVP, CPP Infectious Disease Pharmacist 01/26/2020 9:08 PM

## 2020-01-27 ENCOUNTER — Encounter (HOSPITAL_COMMUNITY): Payer: Self-pay | Admitting: Vascular Surgery

## 2020-01-27 LAB — RENAL FUNCTION PANEL
Albumin: 2.2 g/dL — ABNORMAL LOW (ref 3.5–5.0)
Anion gap: 14 (ref 5–15)
BUN: 16 mg/dL (ref 8–23)
CO2: 24 mmol/L (ref 22–32)
Calcium: 8.9 mg/dL (ref 8.9–10.3)
Chloride: 98 mmol/L (ref 98–111)
Creatinine, Ser: 4.66 mg/dL — ABNORMAL HIGH (ref 0.44–1.00)
GFR calc Af Amer: 11 mL/min — ABNORMAL LOW (ref >60)
GFR calc non Af Amer: 9 mL/min — ABNORMAL LOW (ref >60)
Glucose, Bld: 80 mg/dL (ref 70–99)
Phosphorus: 2.9 mg/dL (ref 2.5–4.6)
Potassium: 3.3 mmol/L — ABNORMAL LOW (ref 3.5–5.1)
Sodium: 136 mmol/L (ref 135–145)

## 2020-01-27 LAB — CBC
HCT: 28.9 % — ABNORMAL LOW (ref 36.0–46.0)
Hemoglobin: 9 g/dL — ABNORMAL LOW (ref 12.0–15.0)
MCH: 31.8 pg (ref 26.0–34.0)
MCHC: 31.1 g/dL (ref 30.0–36.0)
MCV: 102.1 fL — ABNORMAL HIGH (ref 80.0–100.0)
Platelets: 159 10*3/uL (ref 150–400)
RBC: 2.83 MIL/uL — ABNORMAL LOW (ref 3.87–5.11)
RDW: 16.9 % — ABNORMAL HIGH (ref 11.5–15.5)
WBC: 5.2 10*3/uL (ref 4.0–10.5)

## 2020-01-27 LAB — GLUCOSE, CAPILLARY
Glucose-Capillary: 82 mg/dL (ref 70–99)
Glucose-Capillary: 110 mg/dL — ABNORMAL HIGH (ref 70–99)
Glucose-Capillary: 205 mg/dL — ABNORMAL HIGH (ref 70–99)
Glucose-Capillary: 148 mg/dL — ABNORMAL HIGH (ref 70–99)

## 2020-01-27 MED ORDER — POTASSIUM CHLORIDE CRYS ER 20 MEQ PO TBCR
40.0000 meq | EXTENDED_RELEASE_TABLET | Freq: Once | ORAL | Status: AC
  Administered 2020-01-27: 40 meq via ORAL
  Filled 2020-01-27: qty 2

## 2020-01-27 NOTE — Progress Notes (Signed)
[facility] Progress Note  Subjective:    Prolonged bleeding post HD yesterday, had angio with VVS thrombectomy, resolved  2L UF yest  Hb 9.0 this AM, K 3.3  No co this AM  Vitals:   01/26/20 1500 01/26/20 2000 01/27/20 0439 01/27/20 0900  BP: 139/61 (!) 162/61 (!) 144/67 (!) 144/59  Pulse: 77 72 76 74  Resp: 16 18 17 16   Temp: 99.6 F (37.6 C) 98.9 F (37.2 C) 99.1 F (37.3 C) 98.6 F (37 C)  TempSrc: Oral Oral Temporal Oral  SpO2: 95% 98% 95% 100%  Weight:      Height:         Physical Exam:  NAD RRR LUE AVF +B/T no bleeding No LEE S/tn Coarse bs b/l  OP HD:  TTS [place]  3.5h  400/1.5  74.5kg  2/2 bath  Prof 1 Hep none  LUE AVF venofer 100mg  qtreatment mircera 65mcg q4weeks last given 12/01/19   Problem/Plan: 1. AHRF 2/2 acute COVID-19 pneumonitis: sp steroids, remdesivir. Now is awaiting SNF placement. On RA 2. ESRD: on HD TTS. HD yest. Next HD on schedule: 2K, 3.5h, 400/1.5. AVF. No hep. 1-2L UF 3. Anemia of chronic disease: Avoid iron, on aranesp here 69mcg qweekly first dose 8/19, CTM 4. HTN/volume: under dry, stable BP, not eating much, gently probe down post weights as BP permits 5. DM2, per primary 6. CAD s/p stents x 2. On eliquis, asa, statin, BB, imdur 7. Dispo: still awaiting a SNF bed 8. Prolonged bleeding from AVF 9/2, angio with thrombectomy 9/2, stable today, CTM  Rexene Agent  01/27/2020, 10:32 AM   Recent Labs  Lab 01/26/20 0612 01/27/20 0500  K 3.5 3.3*  BUN 28* 16  CREATININE 7.13* 4.66*  CALCIUM 8.8* 8.9  PHOS 3.5 2.9  HGB 7.7* 9.0*   Inpatient medications: . apixaban  2.5 mg Oral BID  . vitamin C  500 mg Oral Daily  . aspirin  81 mg Oral Daily  . atorvastatin  20 mg Oral QHS  . calcitRIOL  2.5 mcg Oral Q T,Th,Sa-HD  . Chlorhexidine Gluconate Cloth  6 each Topical Q0600  . [START ON 01/28/2020] darbepoetin (ARANESP) injection - DIALYSIS  100 mcg Intravenous Q Sat-HD  . insulin aspart  0-6 Units  Subcutaneous TID WC  . isosorbide mononitrate  30 mg Oral Daily  . melatonin  10 mg Oral QHS  . metoprolol tartrate  75 mg Oral BID  . multivitamin  1 tablet Oral QHS  . pantoprazole  20 mg Oral BID  . zinc sulfate  220 mg Oral Daily    acetaminophen, albuterol, chlorpheniramine-HYDROcodone, guaiFENesin-dextromethorphan, loperamide, ondansetron (ZOFRAN) IV, senna-docusate

## 2020-01-27 NOTE — Progress Notes (Signed)
PROGRESS NOTE                                                                                                                                                                                                             Patient Demographics:    Robin [name], is a 69 y.o. female, DOB - 15-Feb-1951, MVE:[id number]  Outpatient Primary MD for the patient is [place] date - 01/10/2020   LOS - 17  No chief complaint on file.      Brief Narrative: Patient is a 69 y.o. female with PMHx of ESRD on HD TTS, dementia, HTN, DM-2-presented to the ED with shortness of breath-acute hypoxic respiratory failure secondary to COVID-19 pneumonia.  COVID-19 vaccinated status: Unvaccinated  Significant Events: 8/17>> Admit to [facility] for hypoxia secondary to COVID-19. 9/2>> bleeding from left arm aVF post HD-vascular surgery consult-s/p thrombectomy and balloon angioplasty.   Significant studies: 8/15>> CT head: No acute intracranial process. 8/17>>Chest x-ray: Chronic central vascular congestion and interstitial prominence-no acute airspace disease.  COVID-19 medications: Steroids: 8/17>> 8/23 Remdesivir: 8/17>> 8/21  Antibiotics: None  Microbiology data: 8/17 >>blood culture: No growth  Procedures: 9/2>>  1.US guided cannulation of left arm avf       2.  Left arm shuntogram 3.  Left arm graft thrombectomy with 55mm balloon angioplasty  Consults: Renal, vascular surgery  DVT prophylaxis: apixaban (ELIQUIS) tablet 2.5 mg Start: 01/27/20 1000 apixaban (ELIQUIS) tablet 2.5 mg     Subjective:   No major issues overnight-lying comfortably in bed when I walked in.   Assessment  & Plan :   Acute Hypoxic Resp Failure due to Covid 19 Viral pneumonia: Significantly improved-has finished a course of steroids and Remdesivir.  Remains on room air this morning.  Continue supportive care.  Fever: afebrile  O2  requirements:  SpO2: 100 % O2 Flow Rate (L/min): 2 L/min   COVID-19 Labs: No results for input(s): DDIMER, FERRITIN, LDH, CRP in the last 72 hours.     Component Value Date/Time   BNP 4,401.9 (H) 01/15/2020 0430    No results for input(s): PROCALCITON in the last 168 hours.  Lab Results  Component Value Date   [place] NEGATIVE 01/03/2020   [place] NEGATIVE 10/01/2019   [place] NEGATIVE 04/14/2019    ESRD: On HD TTS-nephrology following.  Bleeding AV  fistula: Vascular surgery consulted yesterday-required left arm graft thrombectomy and balloon angioplasty.  Vascular surgery following.  Anemia: Likely due to acute illness-superimposed on anemia of ESRD.  Hemoglobin stable this morning.  Continue to follow closely.  CAD: No anginal symptoms-continue aspirin, statin, beta-blocker and Imdur.  History of pulmonary embolism: On Eliquis  HLD: Continue statin  DM-2 (A1c 8.1 on 8/17): CBG stable-continue SSI  Recent Labs    01/26/20 2258 01/27/20 0650 01/27/20 1251  GLUCAP 102* 82 110*    ABG:    Component Value Date/Time   PHART 7.403 (H) 02/27/2008 1450   PCO2ART 38.6 02/27/2008 1450   PO2ART 65.4 (L) 02/27/2008 1450   HCO3 23.5 02/27/2008 1450   TCO2 26 01/08/2020 2021   ACIDBASEDEF 0.6 02/27/2008 1450   O2SAT 91.3 02/27/2008 1450    Vent Settings: N/A    Condition - Stable  Family Communication  : Daughter Judson Roch [phone]) updated over the phone on 9/3  Code Status :  Full Code  Diet :  Diet Order            Diet heart healthy/carb modified Room service appropriate? Yes; Fluid consistency: Thin  Diet effective now                  Disposition Plan  :   Status is: Inpatient  Remains inpatient appropriate because:Inpatient level of care appropriate due to severity of illness   Dispo: The patient is from: Home              Anticipated d/c is to: SNF              Anticipated d/c date is: > 3 days              Patient currently is  not medically stable to d/c.   Barriers to discharge: Awaiting SNF bed  Antimicorbials  :    Anti-infectives (From admission, onward)   Start     Dose/Rate Route Frequency Ordered Stop   01/11/20 1000  remdesivir 100 mg in sodium chloride 0.9 % 100 mL IVPB       "Followed by" Linked Group Details   100 mg 200 mL/hr over 30 Minutes Intravenous Every 24 hours 01/10/20 2129 01/14/20 1918   01/11/20 1000  remdesivir 100 mg in sodium chloride 0.9 % 100 mL IVPB  Status:  Discontinued       "Followed by" Linked Group Details   100 mg 200 mL/hr over 30 Minutes Intravenous Daily 01/10/20 2321 01/10/20 2339   01/10/20 2321  remdesivir 200 mg in sodium chloride 0.9% 250 mL IVPB  Status:  Discontinued       "Followed by" Linked Group Details   200 mg 580 mL/hr over 30 Minutes Intravenous Once 01/10/20 2321 01/10/20 2339   01/10/20 2200  remdesivir 200 mg in sodium chloride 0.9% 250 mL IVPB       "Followed by" Linked Group Details   200 mg 580 mL/hr over 30 Minutes Intravenous Once 01/10/20 2129 01/11/20 0135      Inpatient Medications  Scheduled Meds: . apixaban  2.5 mg Oral BID  . vitamin C  500 mg Oral Daily  . aspirin  81 mg Oral Daily  . atorvastatin  20 mg Oral QHS  . calcitRIOL  2.5 mcg Oral Q T,Th,Sa-HD  . Chlorhexidine Gluconate Cloth  6 each Topical Q0600  . [START ON 01/28/2020] darbepoetin (ARANESP) injection - DIALYSIS  100 mcg Intravenous Q Sat-HD  . insulin  aspart  0-6 Units Subcutaneous TID WC  . isosorbide mononitrate  30 mg Oral Daily  . melatonin  10 mg Oral QHS  . metoprolol tartrate  75 mg Oral BID  . multivitamin  1 tablet Oral QHS  . pantoprazole  20 mg Oral BID  . potassium chloride  40 mEq Oral Once  . zinc sulfate  220 mg Oral Daily   Continuous Infusions: PRN Meds:.acetaminophen, albuterol, chlorpheniramine-HYDROcodone, guaiFENesin-dextromethorphan, loperamide, ondansetron (ZOFRAN) IV, senna-docusate   Time Spent in minutes  25  See all Orders from  today for further details   Oren Binet M.D on 01/27/2020 at 2:59 PM  To page go to [email or web address] - use universal password  [facility] -  Office  [phone]    Objective:   Vitals:   01/26/20 1500 01/26/20 2000 01/27/20 0439 01/27/20 0900  BP: 139/61 (!) 162/61 (!) 144/67 (!) 144/59  Pulse: 77 72 76 74  Resp: $Remo'16 18 17 16  'FNmoT$ Temp: 99.6 F (37.6 C) 98.9 F (37.2 C) 99.1 F (37.3 C) 98.6 F (37 C)  TempSrc: Oral Oral Temporal Oral  SpO2: 95% 98% 95% 100%  Weight:      Height:        Wt Readings from Last 3 Encounters:  01/26/20 68.3 kg  01/03/20 69.9 kg  11/23/19 69.9 kg     Intake/Output Summary (Last 24 hours) at 01/27/2020 1459 Last data filed at 01/27/2020 0300 Gross per 24 hour  Intake 420 ml  Output --  Net 420 ml     Physical Exam Gen Exam:Alert awake-not in any distress HEENT:atraumatic, normocephalic Chest: B/L clear to auscultation anteriorly CVS:S1S2 regular Abdomen:soft non tender, non distended Extremities:no edema Neurology: Non focal Skin: no rash   Data Review:    CBC Recent Labs  Lab 01/21/20 0119 01/26/20 0612 01/27/20 0500  WBC 5.0 6.5 5.2  HGB 8.9* 7.7* 9.0*  HCT 28.6* 24.0* 28.9*  PLT 289 168 159  MCV 101.4* 103.4* 102.1*  MCH 31.6 33.2 31.8  MCHC 31.1 32.1 31.1  RDW 16.8* 17.0* 16.9*    Chemistries  Recent Labs  Lab 01/21/20 0119 01/26/20 0612 01/27/20 0500  NA 137 136 136  K 3.9 3.5 3.3*  CL 96* 97* 98  CO2 $Re'29 26 24  'SFb$ GLUCOSE 113* 132* 80  BUN 14 28* 16  CREATININE 5.14* 7.13* 4.66*  CALCIUM 9.7 8.8* 8.9   ------------------------------------------------------------------------------------------------------------------ No results for input(s): CHOL, HDL, LDLCALC, TRIG, CHOLHDL, LDLDIRECT in the last 72 hours.  Lab Results  Component Value Date   HGBA1C 8.1 (H) 01/10/2020   ------------------------------------------------------------------------------------------------------------------ No results  for input(s): TSH, T4TOTAL, T3FREE, THYROIDAB in the last 72 hours.  Invalid input(s): FREET3 ------------------------------------------------------------------------------------------------------------------ No results for input(s): VITAMINB12, FOLATE, FERRITIN, TIBC, IRON, RETICCTPCT in the last 72 hours.  Coagulation profile No results for input(s): INR, PROTIME in the last 168 hours.  No results for input(s): DDIMER in the last 72 hours.  Cardiac Enzymes No results for input(s): CKMB, TROPONINI, MYOGLOBIN in the last 168 hours.  Invalid input(s): CK ------------------------------------------------------------------------------------------------------------------    Component Value Date/Time   BNP 4,401.9 (H) 01/15/2020 0430    Micro Results No results found for this or any previous visit (from the past 240 hour(s)).  Radiology Reports CT HEAD WO CONTRAST  Result Date: 01/08/2020 CLINICAL DATA:  Mental status change EXAM: CT HEAD WITHOUT CONTRAST TECHNIQUE: Contiguous axial images were obtained from the base of the skull through the vertex without intravenous contrast. COMPARISON:  CT head dated 09/30/2019  FINDINGS: Brain: No evidence of acute infarction, hemorrhage, hydrocephalus, extra-axial collection or mass lesion/mass effect. There is mild cerebral volume loss with associated ex vacuo dilatation. Periventricular white matter hypoattenuation likely represents chronic small vessel ischemic disease. Vascular: There are vascular calcifications in the carotid siphons. Skull: Normal. Negative for fracture or focal lesion. Sinuses/Orbits: No acute finding. Other: None. IMPRESSION: No acute intracranial process. Electronically Signed   By: Romona Curls M.D.   On: 01/08/2020 20:42   PERIPHERAL VASCULAR CATHETERIZATION  Result Date: 01/03/2020 Patient name: THERESA WEDEL MRN: [id number] DOB: 04-14-1951 Sex: female 01/03/2020 Pre-operative Diagnosis: Bleeding from left upper arm dialysis  graft Post-operative diagnosis:  Same Surgeon:  Durene Cal Procedure Performed:  1.  Ultrasound guided access, left upper arm dialysis graft  2.  Shuntogram  3.  Balloon venoplasty, left axillary vein (peripheral)  4.  Conscious sedation, 25 minutes  Indications: The patient came to the ER today for the second time in 3 days for bleeding from her dialysis graft.  I have recommended shuntogram to evaluate for central stenosis. Procedure:  The patient was identified in the holding area and taken to room 8.  The patient was then placed supine on the table and prepped and draped in the usual sterile fashion.  A time out was called.  Conscious sedation was administered with the use of IV fentanyl and Versed under continuous physician and nurse monitoring.  Heart rate, blood pressure, and oxygen saturations were continuously monitored.  Total sedation time was 25 minutes ultrasound was used to evaluate the fistula.  The vein was patent and compressible.  A digital ultrasound image was acquired.  The fistula was then accessed under ultrasound guidance using a micropuncture needle.  An 018 wire was then asvanced without resistance and a micropuncture sheath was placed.  Contrast injections were then performed through the sheath. Findings: The central venous system is widely patent.  A stent is visualized at the venous outflow tract.  Just beyond the stent there is a 80% stenosis within the axillary vein.  The arterial venous anastomosis is widely patent.  Intervention: After the above images were acquired the decision was made to proceed with intervention.  A 7 French sheath was inserted over a Bentson wire.  I used a Glidewire to cross the lesion.  I then proceeded with balloon venoplasty using an 8 x 40 Mustang balloon.  Follow-up imaging revealed improved but suboptimal result and so I upsized to a 12 x 40 Mustang balloon.  This was taken to 20 atm.  Follow-up imaging revealed complete resolution of the stenosis.  The  access site was closed with a 4-0 Monocryl. Impression:  #1  Greater than 80% stenosis just beyond the venous outflow stent treated using an 8 mm followed by 12 mm balloon with residual stenosis less than 10%.  Juleen China, M.D., FACS [facility] Office: [phone] Pager:  [phone]  DG Chest Port 1 View  Result Date: 01/10/2020 CLINICAL DATA:  Hypoxia, shortness of breath, COVID-19 positive EXAM: PORTABLE CHEST 1 VIEW COMPARISON:  09/30/2019 FINDINGS: Single frontal view of the chest demonstrates persistent enlargement of the cardiac silhouette. There is chronic interstitial prominence and mild central vascular congestion. No focal airspace disease, effusion, or pneumothorax. No acute bony abnormalities. IMPRESSION: 1. Chronic central vascular congestion and interstitial prominence. No acute airspace disease. Electronically Signed   By: Sharlet Salina M.D.   On: 01/10/2020 16:10   HYBRID OR IMAGING ([facility] ONLY)  Result Date:  01/26/2020 There is no interpretation for this exam.  This order is for images obtained during a surgical procedure.  Please See "Surgeries" Tab for more information regarding the procedure.

## 2020-01-27 NOTE — Progress Notes (Signed)
Renal Navigator received call from CSW/N. [place]yan to request later OP HD seat time for patient in order to accommodate discharge to SNF. Navigator called and sent secure email to [facility] HD clinic and is awaiting update from [place] on whether there is a later seat for patient. CSW updated. Navigator will continue to follow closely.  Alphonzo Cruise, [place] Renal Navigator [phone]

## 2020-01-27 NOTE — Progress Notes (Signed)
Physical Therapy Treatment Patient Details Name: Robin [name] MRN: [id number] DOB: 1950-06-13 Today's Date: 01/27/2020    History of Present Illness Pt is a 69 yo female presenting via EMS on 01/10/20 with decreased responsiveness and SOB; tested (+) COVID. PMH includes DM, HTN, ESRD (HD TTS), bipolar disorder, dementia.    PT Comments    Pt received in bed, agreeable to participation in therapy. She required min assist bed mobility, min assist transfers, and min assist ambulation bed to recliner. Pt performed LE exercises in recliner. Feet elevated and chair alarm in place at end of session.    Follow Up Recommendations  SNF;Supervision/Assistance - 24 hour     Equipment Recommendations  None recommended by PT    Recommendations for Other Services       Precautions / Restrictions Precautions Precautions: Fall    Mobility  Bed Mobility Overal bed mobility: Needs Assistance Bed Mobility: Supine to Sit     Supine to sit: Min assist;HOB elevated     General bed mobility comments: +rail, assist to initiate and elevate trunk  Transfers Overall transfer level: Needs assistance Equipment used: 1 person hand held assist Transfers: Sit to/from Stand Sit to Stand: Min assist         General transfer comment: assist to power up  Ambulation/Gait Ambulation/Gait assistance: Min assist Gait Distance (Feet): 5 Feet Assistive device: 1 person hand held assist Gait Pattern/deviations: Step-through pattern;Decreased stride length Gait velocity: decreased   General Gait Details: Therapist provided bilat forearm support to ambulate bed to recliner.   Stairs             Wheelchair Mobility    Modified Rankin (Stroke Patients Only)       Balance Overall balance assessment: Needs assistance Sitting-balance support: No upper extremity supported;Feet supported Sitting balance-Leahy Scale: Good     Standing balance support: Bilateral upper extremity  supported;During functional activity;No upper extremity supported Standing balance-Leahy Scale: Fair Standing balance comment: static stand without support, BUE for amb                            Cognition Arousal/Alertness: Awake/alert Behavior During Therapy: Flat affect Overall Cognitive Status: History of cognitive impairments - at baseline                                        Exercises General Exercises - Lower Extremity Ankle Circles/Pumps: AROM;Both;10 reps Long Arc Quad: AROM;Right;Left;10 reps Heel Slides: AROM;Right;Left;10 reps Hip ABduction/ADduction: AROM;Both;10 reps    General Comments General comments (skin integrity, edema, etc.): VSS on RA      Pertinent Vitals/Pain Pain Assessment: No/denies pain    Home Living                      Prior Function            PT Goals (current goals can now be found in the care plan section) Acute Rehab PT Goals Patient Stated Goal: not stated Progress towards PT goals: Progressing toward goals    Frequency    Min 2X/week      PT Plan Current plan remains appropriate    Co-evaluation              AM-PAC PT "6 Clicks" Mobility   Outcome Measure  Help needed turning from your back to your side  while in a flat bed without using bedrails?: A Little Help needed moving from lying on your back to sitting on the side of a flat bed without using bedrails?: A Lot Help needed moving to and from a bed to a chair (including a wheelchair)?: A Little Help needed standing up from a chair using your arms (e.g., wheelchair or bedside chair)?: A Little Help needed to walk in hospital room?: A Little Help needed climbing 3-5 steps with a railing? : A Lot 6 Click Score: 16    End of Session   Activity Tolerance: Patient tolerated treatment well Patient left: in chair;with call bell/phone within reach;with chair alarm set Nurse Communication: Mobility status PT Visit Diagnosis:  Muscle weakness (generalized) (M62.81);Difficulty in walking, not elsewhere classified (R26.2)     Time: 9604-5409 PT Time Calculation (min) (ACUTE ONLY): 26 min  Charges:  $Gait Training: 8-22 mins $Therapeutic Exercise: 8-22 mins                     Lorrin Goodell, PT  Office # [phone] Pager [phone]    Lorriane Shire 01/27/2020, 1:02 PM

## 2020-01-27 NOTE — TOC Progression Note (Addendum)
Transition of Care [facility]) - Progression Note    Patient Details  Name: Robin [name] MRN: [id number] Date of Birth: 05-30-1950  [facility] CM/SW [place], [place] Phone Number: 01/27/2020, 10:12 AM  Clinical Narrative:    10am-CSW left message for [facility] to see if they will be able to transport patient to her dialysis appointments once patient is off of isolation next Tuesday. Awaiting response.   12:20 PM-[place] [facility] requested CSW fax referral as their hub system is down. CSW faxed referral.    Expected Discharge Plan: Skilled Nursing Facility Barriers to Discharge: No SNF bed  Expected Discharge Plan and Services Expected Discharge Plan: [place] In-house Referral: Clinical Social Work   Post Acute Care Choice: [place] Living arrangements for the past 2 months: [place] Determinants of Health (SDOH) Interventions    Readmission Risk Interventions Readmission Risk Prevention Plan 01/23/2020 04/17/2019  Transportation Screening Complete Complete  PCP or Specialist Appt within 3-5 Days - Complete  HRI or [place] - Complete  Social Work Consult for [place] Planning/Counseling - Not Complete  SW consult not completed comments - NA  Palliative Care Screening - Complete  Medication Review Press photographer) Referral to Pharmacy Referral to Pharmacy  PCP or Specialist appointment within 3-5 days of discharge Complete -  [place] or Home Care Consult Complete -  SW Recovery Care/Counseling Consult Complete -  Palliative Care Screening Not Applicable -  [place] Complete -  Some recent data might be hidden

## 2020-01-27 NOTE — Progress Notes (Signed)
Pt arrived to unit.  A&O x 1, VSS.  Pt denies any pain.  Oriented to room and unit.

## 2020-01-28 LAB — RENAL FUNCTION PANEL
Albumin: 2.2 g/dL — ABNORMAL LOW (ref 3.5–5.0)
Anion gap: 10 (ref 5–15)
BUN: 28 mg/dL — ABNORMAL HIGH (ref 8–23)
CO2: 25 mmol/L (ref 22–32)
Calcium: 9.1 mg/dL (ref 8.9–10.3)
Chloride: 98 mmol/L (ref 98–111)
Creatinine, Ser: 6.78 mg/dL — ABNORMAL HIGH (ref 0.44–1.00)
GFR calc Af Amer: 7 mL/min — ABNORMAL LOW (ref >60)
GFR calc non Af Amer: 6 mL/min — ABNORMAL LOW (ref >60)
Glucose, Bld: 113 mg/dL — ABNORMAL HIGH (ref 70–99)
Phosphorus: 2.9 mg/dL (ref 2.5–4.6)
Potassium: 4.1 mmol/L (ref 3.5–5.1)
Sodium: 133 mmol/L — ABNORMAL LOW (ref 135–145)

## 2020-01-28 LAB — CBC
HCT: 25.3 % — ABNORMAL LOW (ref 36.0–46.0)
Hemoglobin: 8.5 g/dL — ABNORMAL LOW (ref 12.0–15.0)
MCH: 35.1 pg — ABNORMAL HIGH (ref 26.0–34.0)
MCHC: 33.6 g/dL (ref 30.0–36.0)
MCV: 104.5 fL — ABNORMAL HIGH (ref 80.0–100.0)
Platelets: 143 10*3/uL — ABNORMAL LOW (ref 150–400)
RBC: 2.42 MIL/uL — ABNORMAL LOW (ref 3.87–5.11)
RDW: 16.6 % — ABNORMAL HIGH (ref 11.5–15.5)
WBC: 5.7 10*3/uL (ref 4.0–10.5)
nRBC: 0 % (ref 0.0–0.2)

## 2020-01-28 LAB — GLUCOSE, CAPILLARY
Glucose-Capillary: 108 mg/dL — ABNORMAL HIGH (ref 70–99)
Glucose-Capillary: 85 mg/dL (ref 70–99)
Glucose-Capillary: 125 mg/dL — ABNORMAL HIGH (ref 70–99)
Glucose-Capillary: 101 mg/dL — ABNORMAL HIGH (ref 70–99)

## 2020-01-28 MED ORDER — CALCITRIOL 0.5 MCG PO CAPS
ORAL_CAPSULE | ORAL | Status: AC
  Filled 2020-01-28: qty 5

## 2020-01-28 NOTE — Progress Notes (Signed)
PROGRESS NOTE                                                                                                                                                                                                             Patient Demographics:    Robin [name], is a 69 y.o. female, DOB - 22-Mar-1951, ZYY:[id number]  Outpatient Primary MD for the patient is [place] date - 01/10/2020   LOS - 18  No chief complaint on file.      Brief Narrative: Patient is a 69 y.o. female with PMHx of ESRD on HD TTS, dementia, HTN, DM-2-presented to the ED with shortness of breath-acute hypoxic respiratory failure secondary to COVID-19 pneumonia.  COVID-19 vaccinated status: Unvaccinated  Significant Events: 8/17>> Admit to [facility] for hypoxia secondary to COVID-19. 9/2>> bleeding from left arm aVF post HD-vascular surgery consult-s/p thrombectomy and balloon angioplasty.   Significant studies: 8/15>> CT head: No acute intracranial process. 8/17>>Chest x-ray: Chronic central vascular congestion and interstitial prominence-no acute airspace disease.  COVID-19 medications: Steroids: 8/17>> 8/23 Remdesivir: 8/17>> 8/21  Antibiotics: None  Microbiology data: 8/17 >>blood culture: No growth  Procedures: 9/2>>  1.US guided cannulation of left arm avf       2.  Left arm shuntogram 3.  Left arm graft thrombectomy with 1mm balloon angioplasty  Consults: Renal, vascular surgery  DVT prophylaxis: apixaban (ELIQUIS) tablet 2.5 mg Start: 01/27/20 1000 apixaban (ELIQUIS) tablet 2.5 mg     Subjective:   Seen in dialysis this morning-no major issues.  Remains on room air.   Assessment  & Plan :   Acute Hypoxic Resp Failure due to Covid 19 Viral pneumonia: Significantly improved-has finished a course of steroids and Remdesivir.  Remains on room air this morning.  Continue supportive care.  Fever: afebrile  O2  requirements:  SpO2: 99 % O2 Flow Rate (L/min): 2 L/min   COVID-19 Labs: No results for input(s): DDIMER, FERRITIN, LDH, CRP in the last 72 hours.     Component Value Date/Time   BNP 4,401.9 (H) 01/15/2020 0430    No results for input(s): PROCALCITON in the last 168 hours.  Lab Results  Component Value Date   [place] NEGATIVE 01/03/2020   [place] NEGATIVE 10/01/2019   [place] NEGATIVE 04/14/2019    ESRD: On HD TTS-nephrology following.  Bleeding  AV fistula: Vascular surgery consulted yesterday-required left arm graft thrombectomy and balloon angioplasty.  Vascular surgery following.  Anemia: Likely due to acute illness-superimposed on anemia of ESRD.  Hemoglobin stable this morning.  Continue to follow closely.  CAD: No anginal symptoms-continue aspirin, statin, beta-blocker and Imdur.  History of pulmonary embolism: On Eliquis  HLD: Continue statin  DM-2 (A1c 8.1 on 8/17): CBG stable-continue SSI  Recent Labs    01/27/20 1734 01/27/20 2001 01/28/20 0623  GLUCAP 205* 148* 108*    ABG:    Component Value Date/Time   PHART 7.403 (H) 02/27/2008 1450   PCO2ART 38.6 02/27/2008 1450   PO2ART 65.4 (L) 02/27/2008 1450   HCO3 23.5 02/27/2008 1450   TCO2 26 01/08/2020 2021   ACIDBASEDEF 0.6 02/27/2008 1450   O2SAT 91.3 02/27/2008 1450    Vent Settings: N/A    Condition - Stable  Family Communication  : Daughter Judson Roch [phone]) updated over the phone on 9/3  Code Status :  Full Code  Diet :  Diet Order            Diet heart healthy/carb modified Room service appropriate? Yes; Fluid consistency: Thin  Diet effective now                  Disposition Plan  :   Status is: Inpatient  Remains inpatient appropriate because:Inpatient level of care appropriate due to severity of illness   Dispo: The patient is from: Home              Anticipated d/c is to: SNF              Anticipated d/c date is: > 3 days              Patient currently  is  medically stable to d/c.   Barriers to discharge: Awaiting SNF bed  Antimicorbials  :    Anti-infectives (From admission, onward)   Start     Dose/Rate Route Frequency Ordered Stop   01/11/20 1000  remdesivir 100 mg in sodium chloride 0.9 % 100 mL IVPB       "Followed by" Linked Group Details   100 mg 200 mL/hr over 30 Minutes Intravenous Every 24 hours 01/10/20 2129 01/14/20 1918   01/11/20 1000  remdesivir 100 mg in sodium chloride 0.9 % 100 mL IVPB  Status:  Discontinued       "Followed by" Linked Group Details   100 mg 200 mL/hr over 30 Minutes Intravenous Daily 01/10/20 2321 01/10/20 2339   01/10/20 2321  remdesivir 200 mg in sodium chloride 0.9% 250 mL IVPB  Status:  Discontinued       "Followed by" Linked Group Details   200 mg 580 mL/hr over 30 Minutes Intravenous Once 01/10/20 2321 01/10/20 2339   01/10/20 2200  remdesivir 200 mg in sodium chloride 0.9% 250 mL IVPB       "Followed by" Linked Group Details   200 mg 580 mL/hr over 30 Minutes Intravenous Once 01/10/20 2129 01/11/20 0135      Inpatient Medications  Scheduled Meds: . apixaban  2.5 mg Oral BID  . vitamin C  500 mg Oral Daily  . aspirin  81 mg Oral Daily  . atorvastatin  20 mg Oral QHS  . calcitRIOL      . calcitRIOL  2.5 mcg Oral Q T,Th,Sa-HD  . Chlorhexidine Gluconate Cloth  6 each Topical Q0600  . darbepoetin (ARANESP) injection - DIALYSIS  100 mcg Intravenous  Q Sat-HD  . insulin aspart  0-6 Units Subcutaneous TID WC  . isosorbide mononitrate  30 mg Oral Daily  . melatonin  10 mg Oral QHS  . metoprolol tartrate  75 mg Oral BID  . multivitamin  1 tablet Oral QHS  . pantoprazole  20 mg Oral BID  . zinc sulfate  220 mg Oral Daily   Continuous Infusions: PRN Meds:.acetaminophen, albuterol, chlorpheniramine-HYDROcodone, guaiFENesin-dextromethorphan, loperamide, ondansetron (ZOFRAN) IV, senna-docusate   Time Spent in minutes  25  See all Orders from today for further details   Oren Binet  M.D on 01/28/2020 at 10:59 AM  To page go to [email or web address] - use universal password  [facility] -  Office  [phone]    Objective:   Vitals:   01/28/20 0900 01/28/20 0930 01/28/20 1000 01/28/20 1030  BP: 125/62 135/65 (!) 131/59 (!) 109/54  Pulse: 68 69 71 67  Resp:      Temp:      TempSrc:      SpO2:      Weight:      Height:        Wt Readings from Last 3 Encounters:  01/28/20 69.8 kg  01/03/20 69.9 kg  11/23/19 69.9 kg     Intake/Output Summary (Last 24 hours) at 01/28/2020 1059 Last data filed at 01/28/2020 0600 Gross per 24 hour  Intake 240 ml  Output --  Net 240 ml     Physical Exam Gen Exam:Alert awake-not in any distress HEENT:atraumatic, normocephalic Chest: B/L clear to auscultation anteriorly CVS:S1S2 regular Abdomen:soft non tender, non distended Extremities:no edema Neurology: Non focal Skin: no rash   Data Review:    CBC Recent Labs  Lab 01/26/20 0612 01/27/20 0500 01/28/20 0608  WBC 6.5 5.2 5.7  HGB 7.7* 9.0* 8.5*  HCT 24.0* 28.9* 25.3*  PLT 168 159 143*  MCV 103.4* 102.1* 104.5*  MCH 33.2 31.8 35.1*  MCHC 32.1 31.1 33.6  RDW 17.0* 16.9* 16.6*    Chemistries  Recent Labs  Lab 01/26/20 0612 01/27/20 0500 01/28/20 0608  NA 136 136 133*  K 3.5 3.3* 4.1  CL 97* 98 98  CO2 $Re'26 24 25  'tzs$ GLUCOSE 132* 80 113*  BUN 28* 16 28*  CREATININE 7.13* 4.66* 6.78*  CALCIUM 8.8* 8.9 9.1   ------------------------------------------------------------------------------------------------------------------ No results for input(s): CHOL, HDL, LDLCALC, TRIG, CHOLHDL, LDLDIRECT in the last 72 hours.  Lab Results  Component Value Date   HGBA1C 8.1 (H) 01/10/2020   ------------------------------------------------------------------------------------------------------------------ No results for input(s): TSH, T4TOTAL, T3FREE, THYROIDAB in the last 72 hours.  Invalid input(s):  FREET3 ------------------------------------------------------------------------------------------------------------------ No results for input(s): VITAMINB12, FOLATE, FERRITIN, TIBC, IRON, RETICCTPCT in the last 72 hours.  Coagulation profile No results for input(s): INR, PROTIME in the last 168 hours.  No results for input(s): DDIMER in the last 72 hours.  Cardiac Enzymes No results for input(s): CKMB, TROPONINI, MYOGLOBIN in the last 168 hours.  Invalid input(s): CK ------------------------------------------------------------------------------------------------------------------    Component Value Date/Time   BNP 4,401.9 (H) 01/15/2020 0430    Micro Results No results found for this or any previous visit (from the past 240 hour(s)).  Radiology Reports CT HEAD WO CONTRAST  Result Date: 01/08/2020 CLINICAL DATA:  Mental status change EXAM: CT HEAD WITHOUT CONTRAST TECHNIQUE: Contiguous axial images were obtained from the base of the skull through the vertex without intravenous contrast. COMPARISON:  CT head dated 09/30/2019 FINDINGS: Brain: No evidence of acute infarction, hemorrhage, hydrocephalus, extra-axial collection or mass lesion/mass effect. There is  mild cerebral volume loss with associated ex vacuo dilatation. Periventricular white matter hypoattenuation likely represents chronic small vessel ischemic disease. Vascular: There are vascular calcifications in the carotid siphons. Skull: Normal. Negative for fracture or focal lesion. Sinuses/Orbits: No acute finding. Other: None. IMPRESSION: No acute intracranial process. Electronically Signed   By: Zerita Boers M.D.   On: 01/08/2020 20:42   PERIPHERAL VASCULAR CATHETERIZATION  Result Date: 01/03/2020 Patient name: BARBAR BREDE MRN: [id number] DOB: 1951-01-12 Sex: female 01/03/2020 Pre-operative Diagnosis: Bleeding from left upper arm dialysis graft Post-operative diagnosis:  Same Surgeon:  Annamarie Major Procedure Performed:  1.   Ultrasound guided access, left upper arm dialysis graft  2.  Shuntogram  3.  Balloon venoplasty, left axillary vein (peripheral)  4.  Conscious sedation, 25 minutes  Indications: The patient came to the ER today for the second time in 3 days for bleeding from her dialysis graft.  I have recommended shuntogram to evaluate for central stenosis. Procedure:  The patient was identified in the holding area and taken to room 8.  The patient was then placed supine on the table and prepped and draped in the usual sterile fashion.  A time out was called.  Conscious sedation was administered with the use of IV fentanyl and Versed under continuous physician and nurse monitoring.  Heart rate, blood pressure, and oxygen saturations were continuously monitored.  Total sedation time was 25 minutes ultrasound was used to evaluate the fistula.  The vein was patent and compressible.  A digital ultrasound image was acquired.  The fistula was then accessed under ultrasound guidance using a micropuncture needle.  An 018 wire was then asvanced without resistance and a micropuncture sheath was placed.  Contrast injections were then performed through the sheath. Findings: The central venous system is widely patent.  A stent is visualized at the venous outflow tract.  Just beyond the stent there is a 80% stenosis within the axillary vein.  The arterial venous anastomosis is widely patent.  Intervention: After the above images were acquired the decision was made to proceed with intervention.  A 7 French sheath was inserted over a Bentson wire.  I used a Glidewire to cross the lesion.  I then proceeded with balloon venoplasty using an 8 x 40 Mustang balloon.  Follow-up imaging revealed improved but suboptimal result and so I upsized to a 12 x 40 Mustang balloon.  This was taken to 20 atm.  Follow-up imaging revealed complete resolution of the stenosis.  The access site was closed with a 4-0 Monocryl. Impression:  #1  Greater than 80% stenosis  just beyond the venous outflow stent treated using an 8 mm followed by 12 mm balloon with residual stenosis less than 10%.  Theotis Burrow, M.D., FACS [facility] Office: [phone] Pager:  [phone]  DG Chest Port 1 View  Result Date: 01/10/2020 CLINICAL DATA:  Hypoxia, shortness of breath, COVID-19 positive EXAM: PORTABLE CHEST 1 VIEW COMPARISON:  09/30/2019 FINDINGS: Single frontal view of the chest demonstrates persistent enlargement of the cardiac silhouette. There is chronic interstitial prominence and mild central vascular congestion. No focal airspace disease, effusion, or pneumothorax. No acute bony abnormalities. IMPRESSION: 1. Chronic central vascular congestion and interstitial prominence. No acute airspace disease. Electronically Signed   By: [name] M.D.   On: 01/10/2020 16:10   HYBRID OR IMAGING ([facility] ONLY)  Result Date: 01/26/2020 There is no interpretation for this exam.  This order is for images obtained during a  surgical procedure.  Please See "Surgeries" Tab for more information regarding the procedure.

## 2020-01-28 NOTE — Plan of Care (Signed)
  Problem: Education: Goal: Knowledge of risk factors and measures for prevention of condition will improve 01/28/2020 2113 by Claire Shown, RN Outcome: Progressing 01/28/2020 2113 by Claire Shown, RN Outcome: Progressing

## 2020-01-29 LAB — GLUCOSE, CAPILLARY
Glucose-Capillary: 130 mg/dL — ABNORMAL HIGH (ref 70–99)
Glucose-Capillary: 210 mg/dL — ABNORMAL HIGH (ref 70–99)
Glucose-Capillary: 122 mg/dL — ABNORMAL HIGH (ref 70–99)
Glucose-Capillary: 129 mg/dL — ABNORMAL HIGH (ref 70–99)

## 2020-01-29 NOTE — Progress Notes (Addendum)
PROGRESS NOTE                                                                                                                                                                                                             Patient Demographics:    Robin [name], is a 69 y.o. female, DOB - Jun 10, 1950, TIR:[id number]  Outpatient Primary MD for the patient is [place] date - 01/10/2020   LOS - 19  No chief complaint on file.      Brief Narrative: Patient is a 69 y.o. female with PMHx of ESRD on HD TTS, dementia, HTN, DM-2-presented to the ED with shortness of breath-acute hypoxic respiratory failure secondary to COVID-19 pneumonia.  COVID-19 vaccinated status: Unvaccinated  Significant Events: 8/17>> Admit to [facility] for hypoxia secondary to COVID-19. 9/2>> bleeding from left arm aVF post HD-vascular surgery consult-s/p thrombectomy and balloon angioplasty.   Significant studies: 8/15>> CT head: No acute intracranial process. 8/17>>Chest x-ray: Chronic central vascular congestion and interstitial prominence-no acute airspace disease.  COVID-19 medications: Steroids: 8/17>> 8/23 Remdesivir: 8/17>> 8/21  Antibiotics: None  Microbiology data: 8/17 >>blood culture: No growth  Procedures: 9/2>>  1.US guided cannulation of left arm avf       2.  Left arm shuntogram 3.  Left arm graft thrombectomy with 33mm balloon angioplasty  Consults: Renal, vascular surgery  DVT prophylaxis: apixaban (ELIQUIS) tablet 2.5 mg Start: 01/27/20 1000 apixaban (ELIQUIS) tablet 2.5 mg     Subjective:   Lying comfortably in bed-no major issues overnight.  Hesitant about going to SNF-I have asked her to talk to her daughter.   Assessment  & Plan :   Acute Hypoxic Resp Failure due to Covid 19 Viral pneumonia: Significantly improved-has finished a course of steroids and Remdesivir.  Remains on room air this morning.   Continue supportive care.  Fever: afebrile  O2 requirements:  SpO2: 97 % O2 Flow Rate (L/min): 2 L/min   COVID-19 Labs: No results for input(s): DDIMER, FERRITIN, LDH, CRP in the last 72 hours.     Component Value Date/Time   BNP 4,401.9 (H) 01/15/2020 0430    No results for input(s): PROCALCITON in the last 168 hours.  Lab Results  Component Value Date   SARSCOV2NAA NEGATIVE 01/03/2020   [place] NEGATIVE 10/01/2019   [place] NEGATIVE 04/14/2019  ESRD: On HD TTS-nephrology following.  Bleeding AV fistula: Vascular surgery consulted-s/p left arm graft thrombectomy and balloon angioplasty.  No further bleeding.  Anemia: Likely due to acute illness-superimposed on anemia of ESRD.  Hemoglobin stable this morning.  Continue to follow closely.  CAD: No anginal symptoms-continue aspirin, statin, beta-blocker and Imdur.  History of pulmonary embolism: On Eliquis  HLD: Continue statin  DM-2 (A1c 8.1 on 8/17): CBG stable-continue SSI  Recent Labs    01/28/20 2024 01/29/20 0637 01/29/20 1221  GLUCAP 101* 130* 210*    ABG:    Component Value Date/Time   PHART 7.403 (H) 02/27/2008 1450   PCO2ART 38.6 02/27/2008 1450   PO2ART 65.4 (L) 02/27/2008 1450   HCO3 23.5 02/27/2008 1450   TCO2 26 01/08/2020 2021   ACIDBASEDEF 0.6 02/27/2008 1450   O2SAT 91.3 02/27/2008 1450    Vent Settings: N/A    Condition - Stable  Family Communication  : Daughter Judson Roch [phone]) updated over the phone on 9/5  Code Status :  Full Code  Diet :  Diet Order            Diet heart healthy/carb modified Room service appropriate? Yes; Fluid consistency: Thin  Diet effective now                  Disposition Plan  :   Status is: Inpatient  Remains inpatient appropriate because:Inpatient level of care appropriate due to severity of illness   Dispo: The patient is from: Home              Anticipated d/c is to: SNF              Anticipated d/c date is: > 3 days               Patient currently is  medically stable to d/c.   Barriers to discharge: Awaiting SNF bed  Antimicorbials  :    Anti-infectives (From admission, onward)   Start     Dose/Rate Route Frequency Ordered Stop   01/11/20 1000  remdesivir 100 mg in sodium chloride 0.9 % 100 mL IVPB       "Followed by" Linked Group Details   100 mg 200 mL/hr over 30 Minutes Intravenous Every 24 hours 01/10/20 2129 01/14/20 1918   01/11/20 1000  remdesivir 100 mg in sodium chloride 0.9 % 100 mL IVPB  Status:  Discontinued       "Followed by" Linked Group Details   100 mg 200 mL/hr over 30 Minutes Intravenous Daily 01/10/20 2321 01/10/20 2339   01/10/20 2321  remdesivir 200 mg in sodium chloride 0.9% 250 mL IVPB  Status:  Discontinued       "Followed by" Linked Group Details   200 mg 580 mL/hr over 30 Minutes Intravenous Once 01/10/20 2321 01/10/20 2339   01/10/20 2200  remdesivir 200 mg in sodium chloride 0.9% 250 mL IVPB       "Followed by" Linked Group Details   200 mg 580 mL/hr over 30 Minutes Intravenous Once 01/10/20 2129 01/11/20 0135      Inpatient Medications  Scheduled Meds: . apixaban  2.5 mg Oral BID  . vitamin C  500 mg Oral Daily  . aspirin  81 mg Oral Daily  . atorvastatin  20 mg Oral QHS  . calcitRIOL  2.5 mcg Oral Q T,Th,Sa-HD  . Chlorhexidine Gluconate Cloth  6 each Topical Q0600  . darbepoetin (ARANESP) injection - DIALYSIS  100 mcg Intravenous Q  Sat-HD  . insulin aspart  0-6 Units Subcutaneous TID WC  . isosorbide mononitrate  30 mg Oral Daily  . melatonin  10 mg Oral QHS  . metoprolol tartrate  75 mg Oral BID  . multivitamin  1 tablet Oral QHS  . pantoprazole  20 mg Oral BID  . zinc sulfate  220 mg Oral Daily   Continuous Infusions: PRN Meds:.acetaminophen, albuterol, chlorpheniramine-HYDROcodone, guaiFENesin-dextromethorphan, loperamide, ondansetron (ZOFRAN) IV, senna-docusate   Time Spent in minutes  15  See all Orders from today for further details   Oren Binet M.D on 01/29/2020 at 1:24 PM  To page go to [email or web address] - use universal password  [facility] -  Office  [phone]    Objective:   Vitals:   01/28/20 2137 01/29/20 0331 01/29/20 0744 01/29/20 0800  BP: (!) 130/59 128/69  (!) 129/52  Pulse: 80   84  Resp:      Temp:  97.7 F (36.5 C) 97.9 F (36.6 C)   TempSrc:  Oral Oral   SpO2:  100%  97%  Weight:      Height:        Wt Readings from Last 3 Encounters:  01/28/20 69.8 kg  01/03/20 69.9 kg  11/23/19 69.9 kg     Intake/Output Summary (Last 24 hours) at 01/29/2020 1324 Last data filed at 01/29/2020 0600 Gross per 24 hour  Intake --  Output 2 ml  Net -2 ml     Physical Exam Gen Exam:Alert awake-not in any distress HEENT:atraumatic, normocephalic Chest: B/L clear to auscultation anteriorly CVS:S1S2 regular Abdomen:soft non tender, non distended Extremities:no edema Neurology: Non focal Skin: no rash   Data Review:    CBC Recent Labs  Lab 01/26/20 0612 01/27/20 0500 01/28/20 0608  WBC 6.5 5.2 5.7  HGB 7.7* 9.0* 8.5*  HCT 24.0* 28.9* 25.3*  PLT 168 159 143*  MCV 103.4* 102.1* 104.5*  MCH 33.2 31.8 35.1*  MCHC 32.1 31.1 33.6  RDW 17.0* 16.9* 16.6*    Chemistries  Recent Labs  Lab 01/26/20 0612 01/27/20 0500 01/28/20 0608  NA 136 136 133*  K 3.5 3.3* 4.1  CL 97* 98 98  CO2 $Re'26 24 25  'qVb$ GLUCOSE 132* 80 113*  BUN 28* 16 28*  CREATININE 7.13* 4.66* 6.78*  CALCIUM 8.8* 8.9 9.1   ------------------------------------------------------------------------------------------------------------------ No results for input(s): CHOL, HDL, LDLCALC, TRIG, CHOLHDL, LDLDIRECT in the last 72 hours.  Lab Results  Component Value Date   HGBA1C 8.1 (H) 01/10/2020   ------------------------------------------------------------------------------------------------------------------ No results for input(s): TSH, T4TOTAL, T3FREE, THYROIDAB in the last 72 hours.  Invalid input(s):  FREET3 ------------------------------------------------------------------------------------------------------------------ No results for input(s): VITAMINB12, FOLATE, FERRITIN, TIBC, IRON, RETICCTPCT in the last 72 hours.  Coagulation profile No results for input(s): INR, PROTIME in the last 168 hours.  No results for input(s): DDIMER in the last 72 hours.  Cardiac Enzymes No results for input(s): CKMB, TROPONINI, MYOGLOBIN in the last 168 hours.  Invalid input(s): CK ------------------------------------------------------------------------------------------------------------------    Component Value Date/Time   BNP 4,401.9 (H) 01/15/2020 0430    Micro Results No results found for this or any previous visit (from the past 240 hour(s)).  Radiology Reports CT HEAD WO CONTRAST  Result Date: 01/08/2020 CLINICAL DATA:  Mental status change EXAM: CT HEAD WITHOUT CONTRAST TECHNIQUE: Contiguous axial images were obtained from the base of the skull through the vertex without intravenous contrast. COMPARISON:  CT head dated 09/30/2019 FINDINGS: Brain: No evidence of acute infarction, hemorrhage, hydrocephalus, extra-axial collection or  mass lesion/mass effect. There is mild cerebral volume loss with associated ex vacuo dilatation. Periventricular white matter hypoattenuation likely represents chronic small vessel ischemic disease. Vascular: There are vascular calcifications in the carotid siphons. Skull: Normal. Negative for fracture or focal lesion. Sinuses/Orbits: No acute finding. Other: None. IMPRESSION: No acute intracranial process. Electronically Signed   By: Zerita Boers M.D.   On: 01/08/2020 20:42   PERIPHERAL VASCULAR CATHETERIZATION  Result Date: 01/03/2020 Patient name: SYLVIA HELMS MRN: [id number] DOB: 1950-05-29 Sex: female 01/03/2020 Pre-operative Diagnosis: Bleeding from left upper arm dialysis graft Post-operative diagnosis:  Same Surgeon:  Annamarie Major Procedure Performed:  1.   Ultrasound guided access, left upper arm dialysis graft  2.  Shuntogram  3.  Balloon venoplasty, left axillary vein (peripheral)  4.  Conscious sedation, 25 minutes  Indications: The patient came to the ER today for the second time in 3 days for bleeding from her dialysis graft.  I have recommended shuntogram to evaluate for central stenosis. Procedure:  The patient was identified in the holding area and taken to room 8.  The patient was then placed supine on the table and prepped and draped in the usual sterile fashion.  A time out was called.  Conscious sedation was administered with the use of IV fentanyl and Versed under continuous physician and nurse monitoring.  Heart rate, blood pressure, and oxygen saturations were continuously monitored.  Total sedation time was 25 minutes ultrasound was used to evaluate the fistula.  The vein was patent and compressible.  A digital ultrasound image was acquired.  The fistula was then accessed under ultrasound guidance using a micropuncture needle.  An 018 wire was then asvanced without resistance and a micropuncture sheath was placed.  Contrast injections were then performed through the sheath. Findings: The central venous system is widely patent.  A stent is visualized at the venous outflow tract.  Just beyond the stent there is a 80% stenosis within the axillary vein.  The arterial venous anastomosis is widely patent.  Intervention: After the above images were acquired the decision was made to proceed with intervention.  A 7 French sheath was inserted over a Bentson wire.  I used a Glidewire to cross the lesion.  I then proceeded with balloon venoplasty using an 8 x 40 Mustang balloon.  Follow-up imaging revealed improved but suboptimal result and so I upsized to a 12 x 40 Mustang balloon.  This was taken to 20 atm.  Follow-up imaging revealed complete resolution of the stenosis.  The access site was closed with a 4-0 Monocryl. Impression:  #1  Greater than 80% stenosis  just beyond the venous outflow stent treated using an 8 mm followed by 12 mm balloon with residual stenosis less than 10%.  Theotis Burrow, M.D., FACS [facility] Office: [phone] Pager:  [phone]  DG Chest Port 1 View  Result Date: 01/10/2020 CLINICAL DATA:  Hypoxia, shortness of breath, COVID-19 positive EXAM: PORTABLE CHEST 1 VIEW COMPARISON:  09/30/2019 FINDINGS: Single frontal view of the chest demonstrates persistent enlargement of the cardiac silhouette. There is chronic interstitial prominence and mild central vascular congestion. No focal airspace disease, effusion, or pneumothorax. No acute bony abnormalities. IMPRESSION: 1. Chronic central vascular congestion and interstitial prominence. No acute airspace disease. Electronically Signed   By: [name] M.D.   On: 01/10/2020 16:10   HYBRID OR IMAGING ([facility] ONLY)  Result Date: 01/26/2020 There is no interpretation for this exam.  This order is  for images obtained during a surgical procedure.  Please See "Surgeries" Tab for more information regarding the procedure.

## 2020-01-29 NOTE — Progress Notes (Signed)
[facility] Progress Note  Subjective: no c/o, seen in room  Vitals:   01/28/20 2137 01/29/20 0331 01/29/20 0744 01/29/20 0800  BP: (!) 130/59 128/69  (!) 129/52  Pulse: 80   84  Resp:      Temp:  97.7 F (36.5 C) 97.9 F (36.6 C)   TempSrc:  Oral Oral   SpO2:  100%  97%  Weight:      Height:        Exam:  alert, nad   no jvd  Chest cta bilat  Cor reg no RG  Abd soft ntnd no ascites   Ext no LE edema   Alert, NF, ox3   LUE AVF+bruit     OP HD: TTS [place]  3.5h  400/1.5  74.5kg  2/2 bath  Prof 1 Hep none  LUE AVF venofer 100mg  qtreatment mircera 73mcg q4weeks last given 12/01/19   Problem/Plan: 1. AHRF 2/2 acute COVID-19 pneumonitis: sp steroids, remdesivir. Awaiting SNF placement. On RA 2. ESRD: on HD TTS. HD Tuesday 3. Anemia of chronic disease: Avoid iron, on aranesp here 45mcg qweekly first dose 8/19, CTM 4. HTN/volume: under dry, not eating much, gently probe down post weights as BP permits. Bp's good.  5. DM2, per primary 6. CAD s/p stents x 2. On eliquis, asa, statin, BB, imdur 7. Dispo: still awaiting a SNF bed 8. Prolonged AVF bleeding: after HD 9/2. Required pressure to control bleeding so pt taken for intervention by [facility] Dr Donzetta Matters , he did a thrombectomy and 8 mm angioplasty.    Rob Doctor, hospital 01/29/2020, 2:31 PM   Recent Labs  Lab 01/27/20 0500 01/28/20 0608  K 3.3* 4.1  BUN 16 28*  CREATININE 4.66* 6.78*  CALCIUM 8.9 9.1  PHOS 2.9 2.9  HGB 9.0* 8.5*   Inpatient medications: . apixaban  2.5 mg Oral BID  . vitamin C  500 mg Oral Daily  . aspirin  81 mg Oral Daily  . atorvastatin  20 mg Oral QHS  . calcitRIOL  2.5 mcg Oral Q T,Th,Sa-HD  . Chlorhexidine Gluconate Cloth  6 each Topical Q0600  . darbepoetin (ARANESP) injection - DIALYSIS  100 mcg Intravenous Q Sat-HD  . insulin aspart  0-6 Units Subcutaneous TID WC  . isosorbide mononitrate  30 mg Oral Daily  . melatonin  10 mg Oral QHS  . metoprolol tartrate  75 mg Oral BID   . multivitamin  1 tablet Oral QHS  . pantoprazole  20 mg Oral BID  . zinc sulfate  220 mg Oral Daily    acetaminophen, albuterol, chlorpheniramine-HYDROcodone, guaiFENesin-dextromethorphan, loperamide, ondansetron (ZOFRAN) IV, senna-docusate

## 2020-01-30 LAB — GLUCOSE, CAPILLARY
Glucose-Capillary: 110 mg/dL — ABNORMAL HIGH (ref 70–99)
Glucose-Capillary: 149 mg/dL — ABNORMAL HIGH (ref 70–99)
Glucose-Capillary: 133 mg/dL — ABNORMAL HIGH (ref 70–99)
Glucose-Capillary: 126 mg/dL — ABNORMAL HIGH (ref 70–99)

## 2020-01-30 MED ORDER — DARBEPOETIN ALFA 100 MCG/0.5ML IJ SOSY: 100.0000 ug | PREFILLED_SYRINGE | INTRAMUSCULAR | Status: DC

## 2020-01-30 NOTE — Progress Notes (Addendum)
PROGRESS NOTE                                                                                                                                                                                                             Patient Demographics:    Robin [name], is a 69 y.o. female, DOB - February 26, 1951, SAY:[id number]  Outpatient Primary MD for the patient is [place] date - 01/10/2020   LOS - 20  No chief complaint on file.      Brief Narrative: Patient is a 69 y.o. female with PMHx of ESRD on HD TTS, dementia, HTN, DM-2-presented to the ED with shortness of breath-acute hypoxic respiratory failure secondary to COVID-19 pneumonia.  COVID-19 vaccinated status: Unvaccinated  Significant Events: 8/16>> diagnosed with COVID-19 8/17>> Admit to [facility] for hypoxia secondary to COVID-19. 9/2>> bleeding from left arm aVF post HD-vascular surgery consult-s/p thrombectomy and balloon angioplasty.  Significant studies: 8/15>> CT head: No acute intracranial process. 8/17>>Chest x-ray: Chronic central vascular congestion and interstitial prominence-no acute airspace disease.  COVID-19 medications: Steroids: 8/17>> 8/23 Remdesivir: 8/17>> 8/21  Antibiotics: None  Microbiology data: 8/17 >>blood culture: No growth  Procedures: 9/2>>  1.US guided cannulation of left arm avf       2.  Left arm shuntogram 3.  Left arm graft thrombectomy with 8m balloon angioplasty  Consults: Renal, vascular surgery  DVT prophylaxis: apixaban (ELIQUIS) tablet 2.5 mg Start: 01/27/20 1000 apixaban (ELIQUIS) tablet 2.5 mg     Subjective:   Lying comfortably in bed-no major issues overnight.   Assessment  & Plan :   Acute Hypoxic Resp Failure due to Covid 19 Viral pneumonia: Significantly improved-has finished a course of steroids and Remdesivir.  Remains on room air this morning.  Continue supportive care.  Fever:  afebrile  O2 requirements:  SpO2: 100 % O2 Flow Rate (L/min): 2 L/min   COVID-19 Labs: No results for input(s): DDIMER, FERRITIN, LDH, CRP in the last 72 hours.     Component Value Date/Time   BNP 4,401.9 (H) 01/15/2020 0430    No results for input(s): PROCALCITON in the last 168 hours.  Lab Results  Component Value Date   [place]GATIVE 01/03/2020   [place] 10/01/2019   [place] 04/14/2019    ESRD: On HD TTS-nephrology following.  Bleeding AV fistula:  Vascular surgery consulted-s/p left arm graft thrombectomy and balloon angioplasty.  No further bleeding.  Anemia: Likely due to acute illness-superimposed on anemia of ESRD.  Hemoglobin stable this morning.  Continue to follow closely.  CAD: No anginal symptoms-continue aspirin, statin, beta-blocker and Imdur.  History of pulmonary embolism: On Eliquis  HLD: Continue statin  DM-2 (A1c 8.1 on 8/17): CBG stable-continue SSI  Recent Labs    01/29/20 2016 01/30/20 0636 01/30/20 1139  GLUCAP 129* 110* 149*    ABG:    Component Value Date/Time   PHART 7.403 (H) 02/27/2008 1450   PCO2ART 38.6 02/27/2008 1450   PO2ART 65.4 (L) 02/27/2008 1450   HCO3 23.5 02/27/2008 1450   TCO2 26 01/08/2020 2021   ACIDBASEDEF 0.6 02/27/2008 1450   O2SAT 91.3 02/27/2008 1450    Vent Settings: N/A    Condition - Stable  Family Communication  : Daughter Judson Roch [phone]) updated over the phone on 9/5  Code Status :  Full Code  Diet :  Diet Order            Diet heart healthy/carb modified Room service appropriate? No; Fluid consistency: Thin  Diet effective now                  Disposition Plan  :   Status is: Inpatient  Remains inpatient appropriate because:Inpatient level of care appropriate due to severity of illness   Dispo: The patient is from: Home              Anticipated d/c is to: SNF              Anticipated d/c date is: > 3 days              Patient currently is   medically stable to d/c.   Barriers to discharge: Awaiting SNF bed  Antimicorbials  :    Anti-infectives (From admission, onward)   Start     Dose/Rate Route Frequency Ordered Stop   01/11/20 1000  remdesivir 100 mg in sodium chloride 0.9 % 100 mL IVPB       "Followed by" Linked Group Details   100 mg 200 mL/hr over 30 Minutes Intravenous Every 24 hours 01/10/20 2129 01/14/20 1918   01/11/20 1000  remdesivir 100 mg in sodium chloride 0.9 % 100 mL IVPB  Status:  Discontinued       "Followed by" Linked Group Details   100 mg 200 mL/hr over 30 Minutes Intravenous Daily 01/10/20 2321 01/10/20 2339   01/10/20 2321  remdesivir 200 mg in sodium chloride 0.9% 250 mL IVPB  Status:  Discontinued       "Followed by" Linked Group Details   200 mg 580 mL/hr over 30 Minutes Intravenous Once 01/10/20 2321 01/10/20 2339   01/10/20 2200  remdesivir 200 mg in sodium chloride 0.9% 250 mL IVPB       "Followed by" Linked Group Details   200 mg 580 mL/hr over 30 Minutes Intravenous Once 01/10/20 2129 01/11/20 0135      Inpatient Medications  Scheduled Meds: . apixaban  2.5 mg Oral BID  . vitamin C  500 mg Oral Daily  . aspirin  81 mg Oral Daily  . atorvastatin  20 mg Oral QHS  . calcitRIOL  2.5 mcg Oral Q T,Th,Sa-HD  . Chlorhexidine Gluconate Cloth  6 each Topical Q0600  . [START ON 01/31/2020] darbepoetin (ARANESP) injection - DIALYSIS  100 mcg Intravenous Q Tue-HD  . insulin aspart  0-6 Units Subcutaneous TID WC  . isosorbide mononitrate  30 mg Oral Daily  . melatonin  10 mg Oral QHS  . metoprolol tartrate  75 mg Oral BID  . multivitamin  1 tablet Oral QHS  . pantoprazole  20 mg Oral BID  . zinc sulfate  220 mg Oral Daily   Continuous Infusions: PRN Meds:.acetaminophen, albuterol, chlorpheniramine-HYDROcodone, guaiFENesin-dextromethorphan, loperamide, ondansetron (ZOFRAN) IV, senna-docusate   Time Spent in minutes  15  See all Orders from today for further details   Oren Binet  M.D on 01/30/2020 at 2:47 PM  To page go to [email or web address] - use universal password  [facility] -  Office  [phone]    Objective:   Vitals:   01/29/20 1629 01/29/20 1954 01/30/20 0438 01/30/20 0900  BP: (!) 124/51 (!) 136/51 (!) 134/47 (!) 140/56  Pulse: 74  71 74  Resp: _0 Temp: 98 F (36.7 C) 97.9 F (36.6 C) 97.9 F (36.6 C) 98 F (36.7 C)  TempSrc: Oral Oral Oral   SpO2: 98% 98% 97% 100%  Weight:      Height:        Wt Readings from Last 3 Encounters:  01/28/20 69.8 kg  01/03/20 69.9 kg  11/23/19 69.9 kg    No intake or output data in the 24 hours ending 01/30/20 1447   Physical Exam Gen Exam:Alert awake-not in any distress HEENT:atraumatic, normocephalic Chest: B/L clear to auscultation anteriorly CVS:S1S2 regular Abdomen:soft non tender, non distended Extremities:no edema Neurology: Non focal Skin: no rash   Data Review:    CBC Recent Labs  Lab 01/26/20 0612 01/27/20 0500 01/28/20 0608  WBC 6.5 5.2 5.7  HGB 7.7* 9.0* 8.5*  HCT 24.0* 28.9* 25.3*  PLT 168 159 143*  MCV 103.4* 102.1* 104.5*  MCH 33.2 31.8 35.1*  MCHC 32.1 31.1 33.6  RDW 17.0* 16.9* 16.6*    Chemistries  Recent Labs  Lab 01/26/20 0612 01/27/20 0500 01/28/20 0608  NA 136 136 133*  K 3.5 3.3* 4.1  CL 97* 98 98  CO2 _1 GLUCOSE 132* 80 113*  BUN 28* 16 28*  CREATININE 7.13* 4.66* 6.78*  CALCIUM 8.8* 8.9 9.1   ------------------------------------------------------------------------------------------------------------------ No results for input(s): CHOL, HDL, LDLCALC, TRIG, CHOLHDL, LDLDIRECT in the last 72 hours.  Lab Results  Component Value Date   HGBA1C 8.1 (H) 01/10/2020   ------------------------------------------------------------------------------------------------------------------ No results for input(s): TSH, T4TOTAL, T3FREE, THYROIDAB in the last 72 hours.  Invalid input(s):  FREET3 ------------------------------------------------------------------------------------------------------------------ No results for input(s): VITAMINB12, FOLATE, FERRITIN, TIBC, IRON, RETICCTPCT in the last 72 hours.  Coagulation profile No results for input(s): INR, PROTIME in the last 168 hours.  No results for input(s): DDIMER in the last 72 hours.  Cardiac Enzymes No results for input(s): CKMB, TROPONINI, MYOGLOBIN in the last 168 hours.  Invalid input(s): CK ------------------------------------------------------------------------------------------------------------------    Component Value Date/Time   BNP 4,401.9 (H) 01/15/2020 0430    Micro Results No results found for this or any previous visit (from the past 240 hour(s)).  Radiology Reports CT HEAD WO CONTRAST  Result Date: 01/08/2020 CLINICAL DATA:  Mental status change EXAM: CT HEAD WITHOUT CONTRAST TECHNIQUE: Contiguous axial images were obtained from the base of the skull through the vertex without intravenous contrast. COMPARISON:  CT head dated 09/30/2019 FINDINGS: Brain: No evidence of acute infarction, hemorrhage, hydrocephalus, extra-axial collection or mass lesion/mass effect. There is mild cerebral volume loss with associated ex vacuo dilatation. Periventricular white matter hypoattenuation  likely represents chronic small vessel ischemic disease. Vascular: There are vascular calcifications in the carotid siphons. Skull: Normal. Negative for fracture or focal lesion. Sinuses/Orbits: No acute finding. Other: None. IMPRESSION: No acute intracranial process. Electronically Signed   By: Zerita Boers M.D.   On: 01/08/2020 20:42   PERIPHERAL VASCULAR CATHETERIZATION  Result Date: 01/03/2020 Patient name: ALIENE TAMURA MRN: [id number] DOB: [birth year] Sex: female 01/03/2020 Pre-operative Diagnosis: Bleeding from left upper arm dialysis graft Post-operative diagnosis:  Same Surgeon:  Annamarie Major Procedure Performed:  1.   Ultrasound guided access, left upper arm dialysis graft  2.  Shuntogram  3.  Balloon venoplasty, left axillary vein (peripheral)  4.  Conscious sedation, 25 minutes  Indications: The patient came to the ER today for the second time in 3 days for bleeding from her dialysis graft.  I have recommended shuntogram to evaluate for central stenosis. Procedure:  The patient was identified in the holding area and taken to room 8.  The patient was then placed supine on the table and prepped and draped in the usual sterile fashion.  A time out was called.  Conscious sedation was administered with the use of IV fentanyl and Versed under continuous physician and nurse monitoring.  Heart rate, blood pressure, and oxygen saturations were continuously monitored.  Total sedation time was 25 minutes ultrasound was used to evaluate the fistula.  The vein was patent and compressible.  A digital ultrasound image was acquired.  The fistula was then accessed under ultrasound guidance using a micropuncture needle.  An 018 wire was then asvanced without resistance and a micropuncture sheath was placed.  Contrast injections were then performed through the sheath. Findings: The central venous system is widely patent.  A stent is visualized at the venous outflow tract.  Just beyond the stent there is a 80% stenosis within the axillary vein.  The arterial venous anastomosis is widely patent.  Intervention: After the above images were acquired the decision was made to proceed with intervention.  A 7 French sheath was inserted over a Bentson wire.  I used a Glidewire to cross the lesion.  I then proceeded with balloon venoplasty using an 8 x 40 Mustang balloon.  Follow-up imaging revealed improved but suboptimal result and so I upsized to a 12 x 40 Mustang balloon.  This was taken to 20 atm.  Follow-up imaging revealed complete resolution of the stenosis.  The access site was closed with a 4-0 Monocryl. Impression:  #1  Greater than 80% stenosis  just beyond the venous outflow stent treated using an 8 mm followed by 12 mm balloon with residual stenosis less than 10%.  Theotis Burrow, M.D., FACS [facility] Office: [phone] Pager:  [phone]  DG Chest Port 1 View  Result Date: 01/10/2020 CLINICAL DATA:  Hypoxia, shortness of breath, COVID-19 positive EXAM: PORTABLE CHEST 1 VIEW COMPARISON:  09/30/2019 FINDINGS: Single frontal view of the chest demonstrates persistent enlargement of the cardiac silhouette. There is chronic interstitial prominence and mild central vascular congestion. No focal airspace disease, effusion, or pneumothorax. No acute bony abnormalities. IMPRESSION: 1. Chronic central vascular congestion and interstitial prominence. No acute airspace disease. Electronically Signed   By: [name] M.D.   On: 01/10/2020 16:10   HYBRID OR IMAGING ([facility] ONLY)  Result Date: 01/26/2020 There is no interpretation for this exam.  This order is for images obtained during a surgical procedure.  Please See "Surgeries" Tab for more information regarding the procedure.

## 2020-01-30 NOTE — Plan of Care (Signed)
  Problem: Education: °Goal: Knowledge of risk factors and measures for prevention of condition will improve °Outcome: Not Progressing °  °

## 2020-01-30 NOTE — Progress Notes (Addendum)
Occupational Therapy Treatment Patient Details Name: Robin [name] MRN: [id number] DOB: [birth year] Today's Date: 01/30/2020    History of present illness Pt is a 69 yo female presenting via EMS on 01/10/20 with decreased responsiveness and SOB; tested (+) COVID. PMH includes DM, HTN, ESRD (HD TTS), bipolar disorder, dementia.   OT comments  Pt seated in recliner upon arrival to room. Pt limited in participation today despite encouragement, with flat affect throughout and reporting fatigue with seated activity. She tolerated seated ADL with setup assist and minimal LB exercise, would not attempt UB exercise or further mobility beyond chair level. VSS on RA throughout. Feel SNF recommendation remains most appropriate at this time. Will continue to follow acutely. Acute OT goal date updated this date as current acute OT goals remain appropriate.    Follow Up Recommendations  SNF;Supervision/Assistance - 24 hour    Equipment Recommendations  None recommended by OT          Precautions / Restrictions Precautions Precautions: Fall Restrictions Weight Bearing Restrictions: No       Mobility Bed Mobility               General bed mobility comments: OOB in recliner   Transfers                 General transfer comment: pt would not attempt to stand despite encouragement, reports too fatigued - distracted by wanting "potato chips"and perseverating on this     Balance Overall balance assessment: Needs assistance Sitting-balance support: No upper extremity supported;Feet supported Sitting balance-Leahy Scale: Good                                     ADL either performed or assessed with clinical judgement   ADL Overall ADL's : Needs assistance/impaired Eating/Feeding: Set up;Sitting Eating/Feeding Details (indicate cue type and reason): for drink Grooming: Wash/dry face;Set up;Sitting Grooming Details (indicate cue type and reason): supported sitting, pt  would not attempt mobility this session to attempt task in standing                Lower Body Dressing Details (indicate cue type and reason): pt utilizing figure 4 to adjust socks when cued while seated in recliner, would not attempt to stand                General ADL Comments: pt engaging in limited seated activity, would not attempting standing or mobility despite encouragement. pt up in recliner start and end of session      Vision       Perception     Praxis      Cognition Arousal/Alertness: Awake/alert Behavior During Therapy: Flat affect Overall Cognitive Status: History of cognitive impairments - at baseline Area of Impairment: Attention;Memory;Following commands;Safety/judgement;Awareness;Problem solving                   Current Attention Level: Sustained Memory: Decreased short-term memory Following Commands: Follows one step commands with increased time;Follows one step commands inconsistently     Problem Solving: Slow processing;Decreased initiation;Requires verbal cues;Difficulty sequencing;Requires tactile cues General Comments: pt perseverating on wanting "potato chips" this session, limited participation despite encouragement         Exercises Exercises: General Lower Extremity General Exercises - Lower Extremity Long Arc Quad: Both;10 reps;Seated;AROM   Shoulder Instructions       General Comments VSS on RA    Pertinent Vitals/  Pain       Pain Assessment: No/denies pain  Home Living                                          Prior Functioning/Environment              Frequency  Min 2X/week        Progress Toward Goals  OT Goals(current goals can now be found in the care plan section)  Progress towards OT goals: OT to reassess next treatment  Acute Rehab OT Goals Patient Stated Goal: not stated OT Goal Formulation: Patient unable to participate in goal setting Time For Goal Achievement:  02/13/20 Potential to Achieve Goals: Good ADL Goals Pt Will Perform Grooming: with supervision;standing Pt Will Transfer to Toilet: with supervision;ambulating;regular height toilet Pt Will Perform Toileting - Clothing Manipulation and hygiene: with supervision;sit to/from stand;sitting/lateral leans Additional ADL Goal #1: Pt will follow simple commands 75% of time during ADLs  Plan Discharge plan remains appropriate    Co-evaluation                 AM-PAC OT "6 Clicks" Daily Activity     Outcome Measure   Help from another person eating meals?: A Little Help from another person taking care of personal grooming?: A Little Help from another person toileting, which includes using toliet, bedpan, or urinal?: A Little Help from another person bathing (including washing, rinsing, drying)?: A Little Help from another person to put on and taking off regular upper body clothing?: A Little Help from another person to put on and taking off regular lower body clothing?: A Little 6 Click Score: 18    End of Session    OT Visit Diagnosis: Unsteadiness on feet (R26.81);Other abnormalities of gait and mobility (R26.89);Muscle weakness (generalized) (M62.81);History of falling (Z91.81);Other symptoms and signs involving cognitive function   Activity Tolerance Patient tolerated treatment well   Patient Left in chair;with call bell/phone within reach;with chair alarm set   Nurse Communication Mobility status        Time: 4098-1191 OT Time Calculation (min): 12 min  Charges: OT General Charges $OT Visit: 1 Visit OT Treatments $Self Care/Home Management : 8-22 mins  Lou Cal, [place] Pager [phone] Office [place] 01/30/2020, 11:56 AM

## 2020-01-30 NOTE — Progress Notes (Signed)
[facility] Progress Note  Subjective: no c/o, seen in room  Vitals:   01/29/20 1629 01/29/20 1954 01/30/20 0438 01/30/20 0900  BP: (!) 124/51 (!) 136/51 (!) 134/47 (!) 140/56  Pulse: 74  71 74  Resp: 16 16 15 16   Temp: 98 F (36.7 C) 97.9 F (36.6 C) 97.9 F (36.6 C) 98 F (36.7 C)  TempSrc: Oral Oral Oral   SpO2: 98% 98% 97% 100%  Weight:      Height:        Exam:  alert, nad   no jvd  Chest cta bilat  Cor reg no RG  Abd soft ntnd no ascites   Ext no LE edema   Alert, NF, ox3   LUE AVF+bruit     OP HD: TTS [place]  3.5h  400/1.5  74.5kg  2/2 bath  Prof 1 Hep none  LUE AVF venofer 100mg  qtreatment mircera 63mcg q4weeks last given 12/01/19   Problem/Plan: 1. Acute COVID-19 pneumonitis: sp steroids, remdesivir. Awaiting SNF placement. On RA.  2. ESRD: on HD TTS. HD tomorrow.  3. Anemia of chronic disease: Avoid iron, on aranesp here 26mcg qweekly first dose 8/19, CTM 4. HTN/volume: under dry, not eating much, gently lower weights as BP permits. Bp's good.  5. DM2, per primary 6. CAD s/p stents x 2. On eliquis, asa, statin, BB, imdur 7. Dispo: still awaiting a SNF bed 8. Prolonged AVF bleeding: after HD 9/2. Required pressure to control bleeding so pt taken for intervention by [facility] Dr Donzetta Matters , he did a thrombectomy and 8 mm angioplasty.    Rob Doctor, hospital 01/30/2020, 3:18 PM   Recent Labs  Lab 01/27/20 0500 01/28/20 0608  K 3.3* 4.1  BUN 16 28*  CREATININE 4.66* 6.78*  CALCIUM 8.9 9.1  PHOS 2.9 2.9  HGB 9.0* 8.5*   Inpatient medications: . apixaban  2.5 mg Oral BID  . vitamin C  500 mg Oral Daily  . aspirin  81 mg Oral Daily  . atorvastatin  20 mg Oral QHS  . calcitRIOL  2.5 mcg Oral Q T,Th,Sa-HD  . Chlorhexidine Gluconate Cloth  6 each Topical Q0600  . [START ON 01/31/2020] darbepoetin (ARANESP) injection - DIALYSIS  100 mcg Intravenous Q Tue-HD  . insulin aspart  0-6 Units Subcutaneous TID WC  . isosorbide mononitrate  30 mg Oral Daily   . melatonin  10 mg Oral QHS  . metoprolol tartrate  75 mg Oral BID  . multivitamin  1 tablet Oral QHS  . pantoprazole  20 mg Oral BID  . zinc sulfate  220 mg Oral Daily    acetaminophen, albuterol, chlorpheniramine-HYDROcodone, guaiFENesin-dextromethorphan, loperamide, ondansetron (ZOFRAN) IV, senna-docusate

## 2020-01-31 DIAGNOSIS — I5042 Chronic combined systolic (congestive) and diastolic (congestive) heart failure: Secondary | ICD-10-CM | POA: Diagnosis not present

## 2020-01-31 DIAGNOSIS — R0602 Shortness of breath: Secondary | ICD-10-CM | POA: Diagnosis not present

## 2020-01-31 DIAGNOSIS — R2689 Other abnormalities of gait and mobility: Secondary | ICD-10-CM | POA: Diagnosis present

## 2020-01-31 DIAGNOSIS — N186 End stage renal disease: Secondary | ICD-10-CM | POA: Diagnosis present

## 2020-01-31 DIAGNOSIS — I5030 Unspecified diastolic (congestive) heart failure: Secondary | ICD-10-CM | POA: Diagnosis present

## 2020-01-31 DIAGNOSIS — R0902 Hypoxemia: Secondary | ICD-10-CM

## 2020-01-31 DIAGNOSIS — R41841 Cognitive communication deficit: Secondary | ICD-10-CM | POA: Diagnosis present

## 2020-01-31 DIAGNOSIS — E11610 Type 2 diabetes mellitus with diabetic neuropathic arthropathy: Secondary | ICD-10-CM | POA: Diagnosis not present

## 2020-01-31 DIAGNOSIS — U071 COVID-19: Secondary | ICD-10-CM | POA: Diagnosis not present

## 2020-01-31 DIAGNOSIS — F319 Bipolar disorder, unspecified: Secondary | ICD-10-CM | POA: Diagnosis not present

## 2020-01-31 DIAGNOSIS — Z743 Need for continuous supervision: Secondary | ICD-10-CM | POA: Diagnosis not present

## 2020-01-31 DIAGNOSIS — F339 Major depressive disorder, recurrent, unspecified: Secondary | ICD-10-CM | POA: Diagnosis present

## 2020-01-31 DIAGNOSIS — I120 Hypertensive chronic kidney disease with stage 5 chronic kidney disease or end stage renal disease: Secondary | ICD-10-CM | POA: Diagnosis not present

## 2020-01-31 DIAGNOSIS — F329 Major depressive disorder, single episode, unspecified: Secondary | ICD-10-CM | POA: Diagnosis not present

## 2020-01-31 DIAGNOSIS — Z992 Dependence on renal dialysis: Secondary | ICD-10-CM | POA: Diagnosis not present

## 2020-01-31 DIAGNOSIS — E1122 Type 2 diabetes mellitus with diabetic chronic kidney disease: Secondary | ICD-10-CM | POA: Diagnosis not present

## 2020-01-31 DIAGNOSIS — E785 Hyperlipidemia, unspecified: Secondary | ICD-10-CM | POA: Diagnosis not present

## 2020-01-31 DIAGNOSIS — I2510 Atherosclerotic heart disease of native coronary artery without angina pectoris: Secondary | ICD-10-CM | POA: Diagnosis not present

## 2020-01-31 DIAGNOSIS — E119 Type 2 diabetes mellitus without complications: Secondary | ICD-10-CM | POA: Diagnosis present

## 2020-01-31 DIAGNOSIS — I10 Essential (primary) hypertension: Secondary | ICD-10-CM | POA: Diagnosis not present

## 2020-01-31 DIAGNOSIS — Z794 Long term (current) use of insulin: Secondary | ICD-10-CM | POA: Diagnosis not present

## 2020-01-31 DIAGNOSIS — J9600 Acute respiratory failure, unspecified whether with hypoxia or hypercapnia: Secondary | ICD-10-CM | POA: Diagnosis not present

## 2020-01-31 DIAGNOSIS — R279 Unspecified lack of coordination: Secondary | ICD-10-CM | POA: Diagnosis not present

## 2020-01-31 LAB — CBC
HCT: 30.8 % — ABNORMAL LOW (ref 36.0–46.0)
Hemoglobin: 9.8 g/dL — ABNORMAL LOW (ref 12.0–15.0)
MCH: 33 pg (ref 26.0–34.0)
MCHC: 31.8 g/dL (ref 30.0–36.0)
MCV: 103.7 fL — ABNORMAL HIGH (ref 80.0–100.0)
Platelets: 193 10*3/uL (ref 150–400)
RBC: 2.97 MIL/uL — ABNORMAL LOW (ref 3.87–5.11)
RDW: 17.2 % — ABNORMAL HIGH (ref 11.5–15.5)
WBC: 7.7 10*3/uL (ref 4.0–10.5)
nRBC: 0 % (ref 0.0–0.2)

## 2020-01-31 LAB — RENAL FUNCTION PANEL
Albumin: 2.6 g/dL — ABNORMAL LOW (ref 3.5–5.0)
Anion gap: 17 — ABNORMAL HIGH (ref 5–15)
BUN: 38 mg/dL — ABNORMAL HIGH (ref 8–23)
CO2: 21 mmol/L — ABNORMAL LOW (ref 22–32)
Calcium: 9.7 mg/dL (ref 8.9–10.3)
Chloride: 99 mmol/L (ref 98–111)
Creatinine, Ser: 8.7 mg/dL — ABNORMAL HIGH (ref 0.44–1.00)
GFR calc Af Amer: 5 mL/min — ABNORMAL LOW (ref >60)
GFR calc non Af Amer: 4 mL/min — ABNORMAL LOW (ref >60)
Glucose, Bld: 203 mg/dL — ABNORMAL HIGH (ref 70–99)
Phosphorus: 4.3 mg/dL (ref 2.5–4.6)
Potassium: 4.8 mmol/L (ref 3.5–5.1)
Sodium: 137 mmol/L (ref 135–145)

## 2020-01-31 LAB — GLUCOSE, CAPILLARY
Glucose-Capillary: 102 mg/dL — ABNORMAL HIGH (ref 70–99)
Glucose-Capillary: 187 mg/dL — ABNORMAL HIGH (ref 70–99)
Glucose-Capillary: 121 mg/dL — ABNORMAL HIGH (ref 70–99)
Glucose-Capillary: 179 mg/dL — ABNORMAL HIGH (ref 70–99)

## 2020-01-31 MED ORDER — PENTAFLUOROPROP-TETRAFLUOROETH EX AERO
INHALATION_SPRAY | CUTANEOUS | Status: AC
  Administered 2020-01-31: 1 via TOPICAL
  Filled 2020-01-31: qty 116

## 2020-01-31 MED ORDER — CALCITRIOL 0.5 MCG PO CAPS: 2.5000 ug | ORAL_CAPSULE | ORAL | 0 refills | Status: DC

## 2020-01-31 MED ORDER — ALTEPLASE 2 MG IJ SOLR: 2.0000 mg | Freq: Once | INTRAMUSCULAR | Status: DC | PRN

## 2020-01-31 MED ORDER — SODIUM CHLORIDE 0.9 % IV SOLN: 100.0000 mL | INTRAVENOUS | Status: DC | PRN

## 2020-01-31 MED ORDER — DARBEPOETIN ALFA 100 MCG/0.5ML IJ SOSY
PREFILLED_SYRINGE | INTRAMUSCULAR | Status: AC
  Administered 2020-01-31: 100 ug via INTRAVENOUS
  Filled 2020-01-31: qty 0.5

## 2020-01-31 MED ORDER — PENTAFLUOROPROP-TETRAFLUOROETH EX AERO: 1.0000 "application " | INHALATION_SPRAY | CUTANEOUS | Status: DC | PRN

## 2020-01-31 MED ORDER — LIDOCAINE-PRILOCAINE 2.5-2.5 % EX CREA: 1.0000 "application " | TOPICAL_CREAM | CUTANEOUS | Status: DC | PRN

## 2020-01-31 MED ORDER — LIDOCAINE-PRILOCAINE 2.5-2.5 % EX CREA
1.0000 "application " | TOPICAL_CREAM | CUTANEOUS | Status: DC | PRN
  Filled 2020-01-31: qty 5

## 2020-01-31 MED ORDER — CALCITRIOL 0.5 MCG PO CAPS
ORAL_CAPSULE | ORAL | Status: AC
  Administered 2020-01-31: 2.5 ug via ORAL
  Filled 2020-01-31: qty 5

## 2020-01-31 MED ORDER — LIDOCAINE HCL (PF) 1 % IJ SOLN: 5.0000 mL | INTRAMUSCULAR | Status: DC | PRN

## 2020-01-31 MED ORDER — HEPARIN SODIUM (PORCINE) 1000 UNIT/ML DIALYSIS
1000.0000 [IU] | INTRAMUSCULAR | Status: DC | PRN
  Filled 2020-01-31: qty 1

## 2020-01-31 MED ORDER — HEPARIN SODIUM (PORCINE) 1000 UNIT/ML DIALYSIS
1000.0000 [IU] | INTRAMUSCULAR | Status: DC | PRN
Start: 2020-01-31 — End: 2020-01-31

## 2020-01-31 NOTE — Progress Notes (Addendum)
1745: RN called and gave report to [place] at [employer]. All questions answered.   RN attempted report to [employer], no answer received. Voice message left with call back number. Will try again before pt discharges.

## 2020-01-31 NOTE — Progress Notes (Signed)
Renal Navigator received call from [place] HD clinic manager with new seat time for patient: TTS 12:30pm. She needs to arrive at 12:10pm. Patient's seat time will change on 02/14/20: TTS 12:00pm. She will need to arrive at 11:40am for this seat.  The above was communicated to CM/M. Stubblefield. MD also notified.  Patient is now cleared for discharge from an OP HD standpoint.  Alphonzo Cruise, [place] Renal Navigator [phone]

## 2020-01-31 NOTE — Progress Notes (Signed)
Physical Therapy Treatment Patient Details Name: Robin [name] MRN: [id number] DOB: 28-Apr-1951 Today's Date: 01/31/2020    History of Present Illness Pt is a 69 yo female presenting via EMS on 01/10/20 with decreased responsiveness and SOB; tested (+) COVID. PMH includes DM, HTN, ESRD (HD TTS), bipolar disorder, dementia.    PT Comments    Pt supine in bed on arrival.  Pt eager to mobilize to the bathroom.  Pt very slow moving but moves well.  MIld LOB noted with standing but able to correct with min assistance.  Continue to recommend SNF placement at d/c to improve strength and functional mobility.      Follow Up Recommendations  SNF;Supervision/Assistance - 24 hour     Equipment Recommendations  None recommended by PT    Recommendations for Other Services       Precautions / Restrictions Precautions Precautions: Fall Restrictions Weight Bearing Restrictions: No    Mobility  Bed Mobility Overal bed mobility: Needs Assistance Bed Mobility: Supine to Sit;Sit to Supine     Supine to sit: Supervision     General bed mobility comments: Pt able to move to edge of bed unassisted.  Transfers Overall transfer level: Needs assistance Equipment used: Rolling walker (2 wheeled) Transfers: Sit to/from Stand Sit to Stand: Min assist         General transfer comment: MIld LOB but able to correct with min assistance.  Ambulation/Gait Ambulation/Gait assistance: Min assist Gait Distance (Feet): 10 Feet (+18 ft) Assistive device: Rolling walker (2 wheeled) Gait Pattern/deviations: Step-through pattern;Decreased stride length;Trunk flexed Gait velocity: decreased   General Gait Details: Cues for upper trunk control and forward gaze as well as RW safety.   Stairs             Wheelchair Mobility    Modified Rankin (Stroke Patients Only)       Balance Overall balance assessment: Needs assistance Sitting-balance support: No upper extremity supported;Feet  supported Sitting balance-Leahy Scale: Good       Standing balance-Leahy Scale: Fair Standing balance comment: static stand without support, BUE for amb                            Cognition Arousal/Alertness: Awake/alert Behavior During Therapy: Flat affect                                          Exercises      General Comments        Pertinent Vitals/Pain Pain Assessment: No/denies pain    Home Living                      Prior Function            PT Goals (current goals can now be found in the care plan section) Acute Rehab PT Goals Patient Stated Goal: not stated Potential to Achieve Goals: Good Progress towards PT goals: Progressing toward goals    Frequency    Min 2X/week      PT Plan Current plan remains appropriate    Co-evaluation              AM-PAC PT "6 Clicks" Mobility   Outcome Measure  Help needed turning from your back to your side while in a flat bed without using bedrails?: None Help needed moving from lying on  your back to sitting on the side of a flat bed without using bedrails?: None Help needed moving to and from a bed to a chair (including a wheelchair)?: A Little Help needed standing up from a chair using your arms (e.g., wheelchair or bedside chair)?: A Little Help needed to walk in hospital room?: A Little Help needed climbing 3-5 steps with a railing? : A Little 6 Click Score: 20    End of Session Equipment Utilized During Treatment: Gait belt Activity Tolerance: Patient tolerated treatment well Patient left: in chair;with call bell/phone within reach;with chair alarm set Nurse Communication: Mobility status PT Visit Diagnosis: Muscle weakness (generalized) (M62.81);Difficulty in walking, not elsewhere classified (R26.2)     Time: 8022-1798 PT Time Calculation (min) (ACUTE ONLY): 30 min  Charges:  $Gait Training: 8-22 mins $Therapeutic Activity: 8-22 mins                      Erasmo Leventhal , PTA Acute Rehabilitation Services Pager [phone] Office [phone]     Janneth Krasner Eli Hose 01/31/2020, 11:17 AM

## 2020-01-31 NOTE — Progress Notes (Signed)
Renal Navigator called [facility] to follow up on request for later seat time in order to accommodate transportation from SNF at discharge. Per Clinic Manager, she is attempting to find another [place] patient who is willing to switch seat times with patient in order for her to get a second shift seat. Clinic Manager will update Navigator.  Robin [name], [facility] Renal Navigator [phone]

## 2020-01-31 NOTE — Procedures (Signed)
   I was present at this dialysis session, have reviewed the session itself and made  appropriate changes Kelly Splinter MD [facility] pager [phone]   01/31/2020, 3:56 PM

## 2020-01-31 NOTE — Discharge Summary (Signed)
PATIENT DETAILS Name: Robin [name] Age: 69 y.o. Sex: female Date of Birth: 1951/03/11 MRN: [id number]. Admitting Physician: Eben Burow, MD LMB:[id number], [place] Date: 01/10/2020 Discharge date: 01/31/2020  Recommendations for Outpatient Follow-up:  1. Follow up with PCP in 1-2 weeks 2. Please obtain CMP/CBC in one week 3. Repeat Chest Xray in 4-6 week 4. Please ensure follow-up with dialysis clinic at your usual schedule  Admitted From:  Home  Disposition: SNF   Home Health: No  Equipment/Devices: None  Discharge Condition: Stable  CODE STATUS: FULL CODE  Diet recommendation:  Diet Order            Diet - low sodium heart healthy           Diet Carb Modified           Diet heart healthy/carb modified Room service appropriate? No; Fluid consistency: Thin  Diet effective now                  Brief Narrative: Patient is a 69 y.o. female with PMHx of ESRD on HD TTS, dementia, HTN, DM-2-presented to the ED with shortness of breath-acute hypoxic respiratory failure secondary to COVID-19 pneumonia.  COVID-19 vaccinated status: Unvaccinated  Significant Events: 8/16>> diagnosed with COVID-19 8/17>> Admit to [facility] for hypoxia secondary to COVID-19. 9/2>> bleeding from left arm aVF post HD-vascular surgery consult-s/p thrombectomy and balloon angioplasty.  Significant studies: 8/15>> CT head: No acute intracranial process. 8/17>>Chest x-ray: Chronic central vascular congestion and interstitial prominence-no acute airspace disease.  COVID-19 medications: Steroids: 8/17>> 8/23 Remdesivir: 8/17>> 8/21  Antibiotics: None  Microbiology data: 8/17 >>blood culture: No growth  Procedures: 9/2>> 1.US guided cannulation of left arm avf 2.Left arm shuntogram 3.Left arm graft thrombectomy with6mballoon angioplasty  Consults: Renal, vascular surgery  Brief Hospital Course: Acute Hypoxic Resp Failure due to Covid  19 Viral pneumonia: Significantly improved-has finished a course of steroids and Remdesivir.  Has been stable on room air for the past several days.  COVID-19 Labs:  No results for input(s): DDIMER, FERRITIN, LDH, CRP in the last 72 hours.  Lab Results  Component Value Date   [place]GATIVE 01/03/2020   [place]GATIVE 10/01/2019   [place]GATIVE 04/14/2019     ESRD: On HD TTS-nephrology following.  Resume usual outpatient regimen.  Bleeding AV fistula: Vascular surgery consulted-s/p left arm graft thrombectomy and balloon angioplasty.  No further bleeding.  Anemia: Likely due to acute illness-superimposed on anemia of ESRD.  Hemoglobin stable this morning.  Continue to follow closely.  CAD: No anginal symptoms-continue  statin, beta-blocker and Imdur.  History of pulmonary embolism: On Eliquis  HLD: Continue statin  DM-2 (A1c 8.1 on 8/17): CBG stable-continue SSI.  CBGs have been stable without the use of Levemir.  PCP to assess at next visit.  Recent Labs    01/30/20 2139 01/31/20 0704 01/31/20 1101  GLUCAP 126* 102* 187*    Debility/deconditioning: Seen by rehab services-recommendations are for SNF in the discharge.  Social worker has involved patient/family-okay to discharge to SNF when bed available.   Discharge Diagnoses:  Active Problems:   Diabetes mellitus type 2, insulin dependent (HCC)   CAD (coronary artery disease), native coronary artery with 2 stents    Hypertension due to end stage renal disease caused by type 2 diabetes mellitus, on dialysis ([place]   ESRD on hemodialysis (HCC)   Prolonged QT interval   COVID-19   Hypoxia   Discharge Instructions:  Person Under Monitoring Name: Robin [name]  Location: [address] [id number]   Infection Prevention Recommendations for Individuals Confirmed to have, or Being Evaluated for, 2019 Novel Coronavirus (COVID-19) Infection Who Receive Care at Home  Individuals  who are confirmed to have, or are being evaluated for, COVID-19 should follow the prevention steps below until a healthcare provider or local or state health department says they can return to normal activities.  Stay home except to get medical care You should restrict activities outside your home, except for getting medical care. Do not go to work, school, or public areas, and do not use public transportation or taxis.  Call ahead before visiting your doctor Before your medical appointment, call the healthcare provider and tell them that you have, or are being evaluated for, COVID-19 infection. This will help the healthcare provider's office take steps to keep other people from getting infected. Ask your healthcare provider to call the local or state health department.  Monitor your symptoms Seek prompt medical attention if your illness is worsening (e.g., difficulty breathing). Before going to your medical appointment, call the healthcare provider and tell them that you have, or are being evaluated for, COVID-19 infection. Ask your healthcare provider to call the local or state health department.  Wear a facemask You should wear a facemask that covers your nose and mouth when you are in the same room with other people and when you visit a healthcare provider. People who live with or visit you should also wear a facemask while they are in the same room with you.  Separate yourself from other people in your home As much as possible, you should stay in a different room from other people in your home. Also, you should use a separate bathroom, if available.  Avoid sharing household items You should not share dishes, drinking glasses, cups, eating utensils, towels, bedding, or other items with other people in your home. After using these items, you should wash them thoroughly with soap and water.  Cover your coughs and sneezes Cover your mouth and nose with a tissue when you cough or  sneeze, or you can cough or sneeze into your sleeve. Throw used tissues in a lined trash can, and immediately wash your hands with soap and water for at least 20 seconds or use an alcohol-based hand rub.  Wash your [facility] your hands often and thoroughly with soap and water for at least 20 seconds. You can use an alcohol-based hand sanitizer if soap and water are not available and if your hands are not visibly dirty. Avoid touching your eyes, nose, and mouth with unwashed hands.   Prevention Steps for Caregivers and Household Members of Individuals Confirmed to have, or Being Evaluated for, COVID-19 Infection Being Cared for in the Home  If you live with, or provide care at home for, a person confirmed to have, or being evaluated for, COVID-19 infection please follow these guidelines to prevent infection:  Follow healthcare provider's instructions Make sure that you understand and can help the patient follow any healthcare provider instructions for all care.  Provide for the patient's basic needs You should help the patient with basic needs in the home and provide support for getting groceries, prescriptions, and other personal needs.  Monitor the patient's symptoms If they are getting sicker, call his or her medical provider and tell them that the patient has, or is being evaluated for, COVID-19 infection. This will help the healthcare provider's office take  steps to keep other people from getting infected. Ask the healthcare provider to call the local or state health department.  Limit the number of people who have contact with the patient  If possible, have only one caregiver for the patient.  Other household members should stay in another home or place of residence. If this is not possible, they should stay  in another room, or be separated from the patient as much as possible. Use a separate bathroom, if available.  Restrict visitors who do not have an essential need to be  in the home.  Keep older adults, very young children, and other sick people away from the patient Keep older adults, very young children, and those who have compromised immune systems or chronic health conditions away from the patient. This includes people with chronic heart, lung, or kidney conditions, diabetes, and cancer.  Ensure good ventilation Make sure that shared spaces in the home have good air flow, such as from an air conditioner or an opened window, weather permitting.  Wash your hands often  Wash your hands often and thoroughly with soap and water for at least 20 seconds. You can use an alcohol based hand sanitizer if soap and water are not available and if your hands are not visibly dirty.  Avoid touching your eyes, nose, and mouth with unwashed hands.  Use disposable paper towels to dry your hands. If not available, use dedicated cloth towels and replace them when they become wet.  Wear a facemask and gloves  Wear a disposable facemask at all times in the room and gloves when you touch or have contact with the patient's blood, body fluids, and/or secretions or excretions, such as sweat, saliva, sputum, nasal mucus, vomit, urine, or feces.  Ensure the mask fits over your nose and mouth tightly, and do not touch it during use.  Throw out disposable facemasks and gloves after using them. Do not reuse.  Wash your hands immediately after removing your facemask and gloves.  If your personal clothing becomes contaminated, carefully remove clothing and launder. Wash your hands after handling contaminated clothing.  Place all used disposable facemasks, gloves, and other waste in a lined container before disposing them with other household waste.  Remove gloves and wash your hands immediately after handling these items.  Do not share dishes, glasses, or other household items with the patient  Avoid sharing household items. You should not share dishes, drinking glasses, cups,  eating utensils, towels, bedding, or other items with a patient who is confirmed to have, or being evaluated for, COVID-19 infection.  After the person uses these items, you should wash them thoroughly with soap and water.  Wash laundry thoroughly  Immediately remove and wash clothes or bedding that have blood, body fluids, and/or secretions or excretions, such as sweat, saliva, sputum, nasal mucus, vomit, urine, or feces, on them.  Wear gloves when handling laundry from the patient.  Read and follow directions on labels of laundry or clothing items and detergent. In general, wash and dry with the warmest temperatures recommended on the label.  Clean all areas the individual has used often  Clean all touchable surfaces, such as counters, tabletops, doorknobs, bathroom fixtures, toilets, phones, keyboards, tablets, and bedside tables, every day. Also, clean any surfaces that may have blood, body fluids, and/or secretions or excretions on them.  Wear gloves when cleaning surfaces the patient has come in contact with.  Use a diluted bleach solution (e.g., dilute bleach with 1 part bleach  and 10 parts water) or a household disinfectant with a label that says EPA-registered for coronaviruses. To make a bleach solution at home, add 1 tablespoon of bleach to 1 quart (4 cups) of water. For a larger supply, add  cup of bleach to 1 gallon (16 cups) of water.  Read labels of cleaning products and follow recommendations provided on product labels. Labels contain instructions for safe and effective use of the cleaning product including precautions you should take when applying the product, such as wearing gloves or eye protection and making sure you have good ventilation during use of the product.  Remove gloves and wash hands immediately after cleaning.  Monitor yourself for signs and symptoms of illness Caregivers and household members are considered close contacts, should monitor their health, and  will be asked to limit movement outside of the home to the extent possible. Follow the monitoring steps for close contacts listed on the symptom monitoring form.   ? If you have additional questions, contact your local health department or call the epidemiologist on call at [phone] (available 24/7). ? This guidance is subject to change. For the most up-to-date guidance from CDC, please refer to their website: YouBlogs.pl   Activity:  As tolerated with Full fall precautions use walker/cane & assistance as needed  Discharge Instructions    Call MD for:  difficulty breathing, headache or visual disturbances   Complete by: As directed    Call MD for:  persistant nausea and vomiting   Complete by: As directed    Diet - low sodium heart healthy   Complete by: As directed    Diet Carb Modified   Complete by: As directed    Discharge instructions   Complete by: As directed    Follow with Primary MD  Center, [place] in 1-2 weeks  Follow with your dialysis center as previous.  Please get a complete blood count and chemistry panel checked by your Primary MD at your next visit, and again as instructed by your Primary MD.  Get Medicines reviewed and adjusted: Please take all your medications with you for your next visit with your Primary MD  Laboratory/radiological data: Please request your Primary MD to go over all hospital tests and procedure/radiological results at the follow up, please ask your Primary MD to get all Hospital records sent to his/her office.  In some cases, they will be blood work, cultures and biopsy results pending at the time of your discharge. Please request that your primary care M.D. follows up on these results.  Also Note the following: If you experience worsening of your admission symptoms, develop shortness of breath, life threatening emergency, suicidal or homicidal thoughts you must  seek medical attention immediately by calling 911 or calling your MD immediately  if symptoms less severe.  You must read complete instructions/literature along with all the possible adverse reactions/side effects for all the Medicines you take and that have been prescribed to you. Take any new Medicines after you have completely understood and accpet all the possible adverse reactions/side effects.   Do not drive when taking Pain medications or sleeping medications (Benzodaizepines)  Do not take more than prescribed Pain, Sleep and Anxiety Medications. It is not advisable to combine anxiety,sleep and pain medications without talking with your primary care practitioner  Special Instructions: If you have smoked or chewed Tobacco  in the last 2 yrs please stop smoking, stop any regular Alcohol  and or any Recreational drug use.  Wear Seat  belts while driving.  Please note: You were cared for by a hospitalist during your hospital stay. Once you are discharged, your primary care physician will handle any further medical issues. Please note that NO REFILLS for any discharge medications will be authorized once you are discharged, as it is imperative that you return to your primary care physician (or establish a relationship with a primary care physician if you do not have one) for your post hospital discharge needs so that they can reassess your need for medications and monitor your lab values.   Increase activity slowly   Complete by: As directed    No dressing needed   Complete by: As directed      Allergies as of 01/31/2020   No Known Allergies     Medication List    STOP taking these medications   amLODipine 5 MG tablet Commonly known as: NORVASC   aspirin 81 MG chewable tablet   HYDROcodone-acetaminophen 5-325 MG tablet Commonly known as: Norco   insulin detemir 100 UNIT/ML injection Commonly known as: LEVEMIR     TAKE these medications   acetaminophen 325 MG tablet Commonly  known as: TYLENOL Take 325-975 mg by mouth every 6 (six) hours as needed for mild pain or headache.   albuterol 108 (90 Base) MCG/ACT inhaler Commonly known as: VENTOLIN HFA Inhale 1-2 puffs into the lungs every 6 (six) hours as needed for wheezing or shortness of breath.   ammonium lactate 12 % lotion Commonly known as: LAC-HYDRIN Apply 1 application topically 2 (two) times daily as needed (for itching/irritation).   ARIPiprazole 10 MG tablet Commonly known as: ABILIFY Take 10 mg by mouth daily. Take with 52m tablet for a total of 164m  ARIPiprazole 2 MG tablet Commonly known as: ABILIFY Take 2 mg by mouth daily. Take with 1026mablet for a total of 55m36matorvastatin 40 MG tablet Commonly known as: LIPITOR Take 20 mg by mouth at bedtime.   Auryxia 1 GM 210 MG(Fe) tablet Generic drug: ferric citrate Take 210 mg by mouth daily.   calcitRIOL 0.5 MCG capsule Commonly known as: ROCALTROL Take 5 capsules (2.5 mcg total) by mouth Every Tuesday,Thursday,and Saturday with dialysis.   dextrose 40 % Gel Commonly known as: GLUTOSE Take 1 Tube by mouth once as needed (for blood sugar less than 60).   Eliquis 5 MG Tabs tablet Generic drug: apixaban Take 2.5 mg by mouth 2 (two) times daily.   gabapentin 100 MG capsule Commonly known as: NEURONTIN Take 100 mg by mouth 2 (two) times daily. What changed: Another medication with the same name was removed. Continue taking this medication, and follow the directions you see here.   insulin lispro 100 UNIT/ML injection Commonly known as: HUMALOG Inject 3-15 Units into the skin 3 (three) times daily before meals. Pt uses as needed per sliding scale:    150-200:  3  units 201-250:  5 units 251-300:  8 units 301-350:  10 units 351-400:  12 units and call MD 400+ 15 units   isosorbide mononitrate 60 MG 24 hr tablet Commonly known as: IMDUR Take 60 mg by mouth daily.   melatonin 5 MG Tabs Take 10 mg by mouth at bedtime.    Metoprolol Tartrate 75 MG Tabs Take 75 mg by mouth 2 (two) times daily.   multivitamin Tabs tablet Take 1 tablet by mouth at bedtime.   nitroGLYCERIN 0.4 MG SL tablet Commonly known as: NITROSTAT Place 0.4 mg under the tongue every  5 (five) minutes as needed for chest pain.   pantoprazole 20 MG tablet Commonly known as: PROTONIX Take 20 mg by mouth 2 (two) times daily.            Discharge Care Instructions  (From admission, onward)         Start     Ordered   01/31/20 0000  No dressing needed        01/31/20 1123          No Known Allergies     Other Procedures/Studies: CT HEAD WO CONTRAST  Result Date: 01/08/2020 CLINICAL DATA:  Mental status change EXAM: CT HEAD WITHOUT CONTRAST TECHNIQUE: Contiguous axial images were obtained from the base of the skull through the vertex without intravenous contrast. COMPARISON:  CT head dated 09/30/2019 FINDINGS: Brain: No evidence of acute infarction, hemorrhage, hydrocephalus, extra-axial collection or mass lesion/mass effect. There is mild cerebral volume loss with associated ex vacuo dilatation. Periventricular white matter hypoattenuation likely represents chronic small vessel ischemic disease. Vascular: There are vascular calcifications in the carotid siphons. Skull: Normal. Negative for fracture or focal lesion. Sinuses/Orbits: No acute finding. Other: None. IMPRESSION: No acute intracranial process. Electronically Signed   By: Zerita Boers M.D.   On: 01/08/2020 20:42   PERIPHERAL VASCULAR CATHETERIZATION  Result Date: 01/03/2020 [name] MRN: [id number] DOB: 12-Jun-1950 Sex: female 01/03/2020 Pre-operative Diagnosis: Bleeding from left upper arm dialysis graft Post-operative diagnosis:  Same Surgeon:  Annamarie Major Procedure Performed:  1.  Ultrasound guided access, left upper arm dialysis graft  2.  Shuntogram  3.  Balloon venoplasty, left axillary vein (peripheral)  4.  Conscious sedation, 25 minutes   Indications: The patient came to the ER today for the second time in 3 days for bleeding from her dialysis graft.  I have recommended shuntogram to evaluate for central stenosis. Procedure:  The patient was identified in the holding area and taken to room 8.  The patient was then placed supine on the table and prepped and draped in the usual sterile fashion.  A time out was called.  Conscious sedation was administered with the use of IV fentanyl and Versed under continuous physician and nurse monitoring.  Heart rate, blood pressure, and oxygen saturations were continuously monitored.  Total sedation time was 25 minutes ultrasound was used to evaluate the fistula.  The vein was patent and compressible.  A digital ultrasound image was acquired.  The fistula was then accessed under ultrasound guidance using a micropuncture needle.  An 018 wire was then asvanced without resistance and a micropuncture sheath was placed.  Contrast injections were then performed through the sheath. Findings: The central venous system is widely patent.  A stent is visualized at the venous outflow tract.  Just beyond the stent there is a 80% stenosis within the axillary vein.  The arterial venous anastomosis is widely patent.  Intervention: After the above images were acquired the decision was made to proceed with intervention.  A 7 French sheath was inserted over a Bentson wire.  I used a Glidewire to cross the lesion.  I then proceeded with balloon venoplasty using an 8 x 40 Mustang balloon.  Follow-up imaging revealed improved but suboptimal result and so I upsized to a 12 x 40 Mustang balloon.  This was taken to 20 atm.  Follow-up imaging revealed complete resolution of the stenosis.  The access site was closed with a 4-0 Monocryl. Impression:  #1  Greater than 80% stenosis just beyond  the venous outflow stent treated using an 8 mm followed by 12 mm balloon with residual stenosis less than 10%.  Theotis Burrow, M.D., FACS [facility] Office: [phone] Pager:  [phone]  DG Chest Port 1 View  Result Date: 01/10/2020 CLINICAL DATA:  Hypoxia, shortness of breath, COVID-19 positive EXAM: PORTABLE CHEST 1 VIEW COMPARISON:  09/30/2019 FINDINGS: Single frontal view of the chest demonstrates persistent enlargement of the cardiac silhouette. There is chronic interstitial prominence and mild central vascular congestion. No focal airspace disease, effusion, or pneumothorax. No acute bony abnormalities. IMPRESSION: 1. Chronic central vascular congestion and interstitial prominence. No acute airspace disease. Electronically Signed   By: [name] M.D.   On: 01/10/2020 16:10   HYBRID OR IMAGING ([facility] ONLY)  Result Date: 01/26/2020 There is no interpretation for this exam.  This order is for images obtained during a surgical procedure.  Please See "Surgeries" Tab for more information regarding the procedure.     TODAY-DAY OF DISCHARGE:  Subjective:   Robin [name] today has no headache,no chest abdominal pain,no new weakness tingling or numbness, feels much better wants to go home today.   Objective:   Blood pressure (!) 151/71, pulse 70, temperature 97.7 F (36.5 C), temperature source Oral, resp. rate 17, height _0  (1.651 m), weight 69.8 kg, SpO2 100 %.  Intake/Output Summary (Last 24 hours) at 01/31/2020 1123 Last data filed at 01/31/2020 0600 Gross per 24 hour  Intake 220 ml  Output --  Net 220 ml   Filed Weights   01/26/20 0516 01/26/20 0735 01/28/20 0750  Weight: 69.4 kg 68.3 kg 69.8 kg    Exam: Awake Alert, Oriented *3, No new F.N deficits, Normal affect [place].AT,PERRAL Supple Neck,No JVD, No cervical lymphadenopathy appriciated.  Symmetrical Chest wall movement, Good air movement bilaterally, CTAB RRR,No Gallops,Rubs or new Murmurs, No Parasternal Heave +ve B.Sounds, Abd Soft, Non tender, No organomegaly appriciated, No rebound -guarding or rigidity. No Cyanosis, Clubbing or  edema, No new Rash or bruise   PERTINENT RADIOLOGIC STUDIES: CT HEAD WO CONTRAST  Result Date: 01/08/2020 CLINICAL DATA:  Mental status change EXAM: CT HEAD WITHOUT CONTRAST TECHNIQUE: Contiguous axial images were obtained from the base of the skull through the vertex without intravenous contrast. COMPARISON:  CT head dated 09/30/2019 FINDINGS: Brain: No evidence of acute infarction, hemorrhage, hydrocephalus, extra-axial collection or mass lesion/mass effect. There is mild cerebral volume loss with associated ex vacuo dilatation. Periventricular white matter hypoattenuation likely represents chronic small vessel ischemic disease. Vascular: There are vascular calcifications in the carotid siphons. Skull: Normal. Negative for fracture or focal lesion. Sinuses/Orbits: No acute finding. Other: None. IMPRESSION: No acute intracranial process. Electronically Signed   By: Zerita Boers M.D.   On: 01/08/2020 20:42   PERIPHERAL VASCULAR CATHETERIZATION  Result Date: 01/03/2020 [name] MRN: [id number] DOB: 22-Feb-1951 Sex: female 01/03/2020 Pre-operative Diagnosis: Bleeding from left upper arm dialysis graft Post-operative diagnosis:  Same Surgeon:  Annamarie Major Procedure Performed:  1.  Ultrasound guided access, left upper arm dialysis graft  2.  Shuntogram  3.  Balloon venoplasty, left axillary vein (peripheral)  4.  Conscious sedation, 25 minutes  Indications: The patient came to the ER today for the second time in 3 days for bleeding from her dialysis graft.  I have recommended shuntogram to evaluate for central stenosis. Procedure:  The patient was identified in the holding area and taken to room 8.  The patient was then placed supine  on the table and prepped and draped in the usual sterile fashion.  A time out was called.  Conscious sedation was administered with the use of IV fentanyl and Versed under continuous physician and nurse monitoring.  Heart rate, blood pressure, and oxygen  saturations were continuously monitored.  Total sedation time was 25 minutes ultrasound was used to evaluate the fistula.  The vein was patent and compressible.  A digital ultrasound image was acquired.  The fistula was then accessed under ultrasound guidance using a micropuncture needle.  An 018 wire was then asvanced without resistance and a micropuncture sheath was placed.  Contrast injections were then performed through the sheath. Findings: The central venous system is widely patent.  A stent is visualized at the venous outflow tract.  Just beyond the stent there is a 80% stenosis within the axillary vein.  The arterial venous anastomosis is widely patent.  Intervention: After the above images were acquired the decision was made to proceed with intervention.  A 7 French sheath was inserted over a Bentson wire.  I used a Glidewire to cross the lesion.  I then proceeded with balloon venoplasty using an 8 x 40 Mustang balloon.  Follow-up imaging revealed improved but suboptimal result and so I upsized to a 12 x 40 Mustang balloon.  This was taken to 20 atm.  Follow-up imaging revealed complete resolution of the stenosis.  The access site was closed with a 4-0 Monocryl. Impression:  #1  Greater than 80% stenosis just beyond the venous outflow stent treated using an 8 mm followed by 12 mm balloon with residual stenosis less than 10%.  Theotis Burrow, M.D., FACS [facility] Office: [phone] Pager:  [phone]  DG Chest Port 1 View  Result Date: 01/10/2020 CLINICAL DATA:  Hypoxia, shortness of breath, COVID-19 positive EXAM: PORTABLE CHEST 1 VIEW COMPARISON:  09/30/2019 FINDINGS: Single frontal view of the chest demonstrates persistent enlargement of the cardiac silhouette. There is chronic interstitial prominence and mild central vascular congestion. No focal airspace disease, effusion, or pneumothorax. No acute bony abnormalities. IMPRESSION: 1. Chronic central vascular  congestion and interstitial prominence. No acute airspace disease. Electronically Signed   By: [name] M.D.   On: 01/10/2020 16:10   HYBRID OR IMAGING ([facility] ONLY)  Result Date: 01/26/2020 There is no interpretation for this exam.  This order is for images obtained during a surgical procedure.  Please See "Surgeries" Tab for more information regarding the procedure.     PERTINENT LAB RESULTS: CBC: Recent Labs    01/31/20 1014  WBC 7.7  HGB 9.8*  HCT 30.8*  PLT 193   CMET CMP     Component Value Date/Time   NA 133 (L) 01/28/2020 0608   NA 140 08/25/2016 0000   K 4.1 01/28/2020 0608   CL 98 01/28/2020 0608   CO2 25 01/28/2020 0608   GLUCOSE 113 (H) 01/28/2020 0608   BUN 28 (H) 01/28/2020 0608   BUN 31 (A) 08/25/2016 0000   CREATININE 6.78 (H) 01/28/2020 0608   CALCIUM 9.1 01/28/2020 0608   PROT 5.6 (L) 01/15/2020 0430   ALBUMIN 2.2 (L) 01/28/2020 0608   AST 35 01/15/2020 0430   ALT 21 01/15/2020 0430   ALKPHOS 74 01/15/2020 0430   BILITOT 0.7 01/15/2020 0430   GFRNONAA 6 (L) 01/28/2020 0608   GFRAA 7 (L) 01/28/2020 0608    GFR Estimated Creatinine Clearance: 7.9 mL/min (A) (by C-G formula based on SCr of 6.78 mg/dL (  H)). No results for input(s): LIPASE, AMYLASE in the last 72 hours. No results for input(s): CKTOTAL, CKMB, CKMBINDEX, TROPONINI in the last 72 hours. Invalid input(s): POCBNP No results for input(s): DDIMER in the last 72 hours. No results for input(s): HGBA1C in the last 72 hours. No results for input(s): CHOL, HDL, LDLCALC, TRIG, CHOLHDL, LDLDIRECT in the last 72 hours. No results for input(s): TSH, T4TOTAL, T3FREE, THYROIDAB in the last 72 hours.  Invalid input(s): FREET3 No results for input(s): VITAMINB12, FOLATE, FERRITIN, TIBC, IRON, RETICCTPCT in the last 72 hours. Coags: No results for input(s): INR in the last 72 hours.  Invalid input(s): PT Microbiology: No results found for this or any previous visit (from the past 240  hour(s)).  FURTHER DISCHARGE INSTRUCTIONS:  Get Medicines reviewed and adjusted: Please take all your medications with you for your next visit with your Primary MD  Laboratory/radiological data: Please request your Primary MD to go over all hospital tests and procedure/radiological results at the follow up, please ask your Primary MD to get all Hospital records sent to his/her office.  In some cases, they will be blood work, cultures and biopsy results pending at the time of your discharge. Please request that your primary care M.D. goes through all the records of your hospital data and follows up on these results.  Also Note the following: If you experience worsening of your admission symptoms, develop shortness of breath, life threatening emergency, suicidal or homicidal thoughts you must seek medical attention immediately by calling 911 or calling your MD immediately  if symptoms less severe.  You must read complete instructions/literature along with all the possible adverse reactions/side effects for all the Medicines you take and that have been prescribed to you. Take any new Medicines after you have completely understood and accpet all the possible adverse reactions/side effects.   Do not drive when taking Pain medications or sleeping medications (Benzodaizepines)  Do not take more than prescribed Pain, Sleep and Anxiety Medications. It is not advisable to combine anxiety,sleep and pain medications without talking with your primary care practitioner  Special Instructions: If you have smoked or chewed Tobacco  in the last 2 yrs please stop smoking, stop any regular Alcohol  and or any Recreational drug use.  Wear Seat belts while driving.  Please note: You were cared for by a hospitalist during your hospital stay. Once you are discharged, your primary care physician will handle any further medical issues. Please note that NO REFILLS for any discharge medications will be authorized once  you are discharged, as it is imperative that you return to your primary care physician (or establish a relationship with a primary care physician if you do not have one) for your post hospital discharge needs so that they can reassess your need for medications and monitor your lab values.  Total Time spent coordinating discharge including counseling, education and face to face time equals 35 minutes.  SignedOren Binet 01/31/2020 11:23 AM

## 2020-01-31 NOTE — TOC Progression Note (Addendum)
Transition of Care [facility]) - Progression Note    Patient Details  Name: Robin [name] MRN: [id number] Date of Birth: [birth year]  [facility] CM/SW Wink, RN Phone Number: 01/31/2020, 11:31 AM  Clinical Narrative:    Case Management called and spoke with Terri Piedra, CSW, renal navigator to coordinate a later HD time for the patient on T, TH, Sat so that patient can transfer to [facility].  I spoke with [name] and they have accepted the patient once this is clarified.  Dr. Sloan Leiter states that he spoke with the patient this morning and the patient would prefer to discharge home but the daughter would like her transferred to a SNF.  I called and did not receive an answer from the daughter, [name], but left a message on her voice mail to update her and determine if the patient will be transferring to [facility] facility.  9/7 1242-  I called and spoke with the patient's daughter, [name], who cares for the patient at home.  Ms. Rudder stated that she is not able to take care of the patient at her home and she will need to go the facility for rehab - [facility].  Ms. Stammer is recovering from COVID symptoms herself and is not able to safely care for her at this time.  She states that after the patient has received rehab at the facility that the patient will be able to return home with her.    I also spoke with Terri Piedra - and the patient's HD time is being coordinated and has not been changed at this time.  As soon as I am notified that the patient's HD time has been switched - she will be able to transfer to [facility].  I will continue to follow the patient for her transfer to the SNF.  Expected Discharge Plan: Skilled Nursing Facility Barriers to Discharge: No SNF bed  Expected Discharge Plan and Services Expected Discharge Plan: [place] In-house Referral: Clinical Social Work   Post Acute Care Choice:  [place] Living arrangements for the past 2 months: [place] Expected Discharge Date: 01/31/20                                     Social Determinants of Health (SDOH) Interventions    Readmission Risk Interventions Readmission Risk Prevention Plan 01/23/2020 04/17/2019  Transportation Screening Complete Complete  PCP or Specialist Appt within 3-5 Days - Complete  HRI or [place] - Complete  Social Work Consult for [place] Planning/Counseling - Not Complete  SW consult not completed comments - NA  Palliative Care Screening - Complete  Medication Review Press photographer) Referral to Pharmacy Referral to Pharmacy  PCP or Specialist appointment within 3-5 days of discharge Complete -  [place] or Home Care Consult Complete -  SW Recovery Care/Counseling Consult Complete -  Palliative Care Screening Not Applicable -  [place] Complete -  Some recent data might be hidden

## 2020-01-31 NOTE — Progress Notes (Signed)
Pt refused to allow VS to be taken.

## 2020-01-31 NOTE — TOC Transition Note (Signed)
Transition of Care [name]) - CM/SW Discharge Note   Patient Details  Name: Robin [name] MRN: [id number] Date of Birth: 01/11/1951  [facility]) CM/SW Contact:  Curlene Labrum, RN Phone Number: 01/31/2020, 3:41 PM   Clinical Narrative:    Case management spoke with Terri Piedra, Renal navigator and the patient's HD bed was adjusted to T, TH, Sat at arrival time of 1210 and seat for HD at 1230 and starting 02/14/2020 - patient's arrival for HD is 1140 and seat time at 1200.  I called [place] and spoke with Theodoro Parma and updated her on the dialysis times.  I spoke with [place] and the patient will return to [place] today after dialysis.  I scheduled the patient's PTAR for 430 pm and spoke with the daughter, Judson Roch on the phone and notified her of the patient's transfer.  The patient's nurse, Sharyn Lull will be calling report to [place] at [phone] for room 127.  The clinicals were sent in the hub for the facility for discharge paperwork.     Barriers to Discharge: No SNF bed   Patient Goals and CMS Choice Patient states their goals for this hospitalization and ongoing recovery are:: Rehab then return home CMS Medicare.gov Compare Post Acute Care list provided to:: Patient Represenative (must comment) (Daughter) Choice offered to / list presented to : Adult Children  Discharge Placement                       Discharge Plan and Services In-house Referral: Clinical Social Work   Post Acute Care Choice: [place]                               Social Determinants of Health (SDOH) Interventions     Readmission Risk Interventions Readmission Risk Prevention Plan 01/23/2020 04/17/2019  Transportation Screening Complete Complete  PCP or Specialist Appt within 3-5 Days - Complete  HRI or [place] - Complete  Social Work Consult for [place] Planning/Counseling - Not Complete  SW consult not  completed comments - NA  Palliative Care Screening - Complete  Medication Review Press photographer) Referral to Pharmacy Referral to Pharmacy  PCP or Specialist appointment within 3-5 days of discharge Complete -  [place] or Home Care Consult Complete -  SW Recovery Care/Counseling Consult Complete -  Palliative Care Screening Not Applicable -  [place] Complete -  Some recent data might be hidden

## 2020-02-01 DIAGNOSIS — F319 Bipolar disorder, unspecified: Secondary | ICD-10-CM | POA: Diagnosis not present

## 2020-02-01 DIAGNOSIS — N186 End stage renal disease: Secondary | ICD-10-CM | POA: Diagnosis not present

## 2020-02-01 DIAGNOSIS — U071 COVID-19: Secondary | ICD-10-CM | POA: Diagnosis not present

## 2020-02-01 DIAGNOSIS — E119 Type 2 diabetes mellitus without complications: Secondary | ICD-10-CM | POA: Diagnosis not present

## 2020-02-02 DIAGNOSIS — I10 Essential (primary) hypertension: Secondary | ICD-10-CM | POA: Diagnosis not present

## 2020-02-02 DIAGNOSIS — U071 COVID-19: Secondary | ICD-10-CM | POA: Diagnosis not present

## 2020-02-02 DIAGNOSIS — N186 End stage renal disease: Secondary | ICD-10-CM | POA: Diagnosis not present

## 2020-02-02 DIAGNOSIS — F329 Major depressive disorder, single episode, unspecified: Secondary | ICD-10-CM | POA: Diagnosis not present

## 2020-02-02 DIAGNOSIS — I5042 Chronic combined systolic (congestive) and diastolic (congestive) heart failure: Secondary | ICD-10-CM | POA: Diagnosis not present

## 2020-02-02 DIAGNOSIS — E119 Type 2 diabetes mellitus without complications: Secondary | ICD-10-CM | POA: Diagnosis not present

## 2020-02-08 DIAGNOSIS — E119 Type 2 diabetes mellitus without complications: Secondary | ICD-10-CM | POA: Diagnosis not present

## 2020-02-08 DIAGNOSIS — F319 Bipolar disorder, unspecified: Secondary | ICD-10-CM | POA: Diagnosis not present

## 2020-02-08 DIAGNOSIS — U071 COVID-19: Secondary | ICD-10-CM | POA: Diagnosis not present

## 2020-02-08 DIAGNOSIS — I2510 Atherosclerotic heart disease of native coronary artery without angina pectoris: Secondary | ICD-10-CM | POA: Diagnosis not present

## 2020-02-08 DIAGNOSIS — N186 End stage renal disease: Secondary | ICD-10-CM | POA: Diagnosis not present

## 2020-02-15 DIAGNOSIS — U071 COVID-19: Secondary | ICD-10-CM | POA: Diagnosis not present

## 2020-02-15 DIAGNOSIS — I10 Essential (primary) hypertension: Secondary | ICD-10-CM | POA: Diagnosis not present

## 2020-02-15 DIAGNOSIS — I2510 Atherosclerotic heart disease of native coronary artery without angina pectoris: Secondary | ICD-10-CM | POA: Diagnosis not present

## 2020-02-15 DIAGNOSIS — N186 End stage renal disease: Secondary | ICD-10-CM | POA: Diagnosis not present

## 2020-02-15 DIAGNOSIS — E119 Type 2 diabetes mellitus without complications: Secondary | ICD-10-CM | POA: Diagnosis not present

## 2020-02-20 DIAGNOSIS — E119 Type 2 diabetes mellitus without complications: Secondary | ICD-10-CM | POA: Diagnosis not present

## 2020-02-20 DIAGNOSIS — E11610 Type 2 diabetes mellitus with diabetic neuropathic arthropathy: Secondary | ICD-10-CM | POA: Diagnosis not present

## 2020-02-20 DIAGNOSIS — N186 End stage renal disease: Secondary | ICD-10-CM | POA: Diagnosis not present

## 2020-02-20 DIAGNOSIS — F319 Bipolar disorder, unspecified: Secondary | ICD-10-CM | POA: Diagnosis not present

## 2020-02-20 DIAGNOSIS — I2510 Atherosclerotic heart disease of native coronary artery without angina pectoris: Secondary | ICD-10-CM | POA: Diagnosis not present

## 2020-02-22 DIAGNOSIS — E119 Type 2 diabetes mellitus without complications: Secondary | ICD-10-CM | POA: Diagnosis not present

## 2020-02-22 DIAGNOSIS — N186 End stage renal disease: Secondary | ICD-10-CM | POA: Diagnosis not present

## 2020-02-22 DIAGNOSIS — F319 Bipolar disorder, unspecified: Secondary | ICD-10-CM | POA: Diagnosis not present

## 2020-02-22 DIAGNOSIS — I2510 Atherosclerotic heart disease of native coronary artery without angina pectoris: Secondary | ICD-10-CM | POA: Diagnosis not present

## 2020-02-22 DIAGNOSIS — E11610 Type 2 diabetes mellitus with diabetic neuropathic arthropathy: Secondary | ICD-10-CM | POA: Diagnosis not present

## 2020-02-24 ENCOUNTER — Ambulatory Visit: Payer: Medicare Other | Admitting: Podiatry

## 2020-02-29 DIAGNOSIS — E119 Type 2 diabetes mellitus without complications: Secondary | ICD-10-CM | POA: Diagnosis not present

## 2020-02-29 DIAGNOSIS — E785 Hyperlipidemia, unspecified: Secondary | ICD-10-CM | POA: Diagnosis not present

## 2020-02-29 DIAGNOSIS — I2510 Atherosclerotic heart disease of native coronary artery without angina pectoris: Secondary | ICD-10-CM | POA: Diagnosis not present

## 2020-02-29 DIAGNOSIS — N186 End stage renal disease: Secondary | ICD-10-CM | POA: Diagnosis not present

## 2020-02-29 DIAGNOSIS — I5042 Chronic combined systolic (congestive) and diastolic (congestive) heart failure: Secondary | ICD-10-CM | POA: Diagnosis not present

## 2020-03-05 DIAGNOSIS — F319 Bipolar disorder, unspecified: Secondary | ICD-10-CM | POA: Diagnosis not present

## 2020-03-05 DIAGNOSIS — I2510 Atherosclerotic heart disease of native coronary artery without angina pectoris: Secondary | ICD-10-CM | POA: Diagnosis not present

## 2020-03-05 DIAGNOSIS — Z794 Long term (current) use of insulin: Secondary | ICD-10-CM | POA: Diagnosis not present

## 2020-03-05 DIAGNOSIS — Z992 Dependence on renal dialysis: Secondary | ICD-10-CM | POA: Diagnosis not present

## 2020-03-05 DIAGNOSIS — J1281 Pneumonia due to SARS-associated coronavirus: Secondary | ICD-10-CM | POA: Diagnosis not present

## 2020-03-05 DIAGNOSIS — I130 Hypertensive heart and chronic kidney disease with heart failure and stage 1 through stage 4 chronic kidney disease, or unspecified chronic kidney disease: Secondary | ICD-10-CM | POA: Diagnosis not present

## 2020-03-05 DIAGNOSIS — J1282 Pneumonia due to coronavirus disease 2019: Secondary | ICD-10-CM | POA: Diagnosis not present

## 2020-03-05 DIAGNOSIS — E785 Hyperlipidemia, unspecified: Secondary | ICD-10-CM | POA: Diagnosis not present

## 2020-03-05 DIAGNOSIS — I5030 Unspecified diastolic (congestive) heart failure: Secondary | ICD-10-CM | POA: Diagnosis not present

## 2020-03-05 DIAGNOSIS — E1122 Type 2 diabetes mellitus with diabetic chronic kidney disease: Secondary | ICD-10-CM | POA: Diagnosis not present

## 2020-03-05 DIAGNOSIS — U071 COVID-19: Secondary | ICD-10-CM | POA: Diagnosis not present

## 2020-03-05 DIAGNOSIS — F339 Major depressive disorder, recurrent, unspecified: Secondary | ICD-10-CM | POA: Diagnosis not present

## 2020-03-05 DIAGNOSIS — F0390 Unspecified dementia without behavioral disturbance: Secondary | ICD-10-CM | POA: Diagnosis not present

## 2020-03-05 DIAGNOSIS — N186 End stage renal disease: Secondary | ICD-10-CM | POA: Diagnosis not present

## 2020-03-05 DIAGNOSIS — Z86711 Personal history of pulmonary embolism: Secondary | ICD-10-CM | POA: Diagnosis not present

## 2020-03-06 DIAGNOSIS — I130 Hypertensive heart and chronic kidney disease with heart failure and stage 1 through stage 4 chronic kidney disease, or unspecified chronic kidney disease: Secondary | ICD-10-CM | POA: Diagnosis not present

## 2020-03-06 DIAGNOSIS — U071 COVID-19: Secondary | ICD-10-CM | POA: Diagnosis not present

## 2020-03-06 DIAGNOSIS — I2510 Atherosclerotic heart disease of native coronary artery without angina pectoris: Secondary | ICD-10-CM | POA: Diagnosis not present

## 2020-03-06 DIAGNOSIS — I5030 Unspecified diastolic (congestive) heart failure: Secondary | ICD-10-CM | POA: Diagnosis not present

## 2020-03-06 DIAGNOSIS — J1282 Pneumonia due to coronavirus disease 2019: Secondary | ICD-10-CM | POA: Diagnosis not present

## 2020-03-06 DIAGNOSIS — E1122 Type 2 diabetes mellitus with diabetic chronic kidney disease: Secondary | ICD-10-CM | POA: Diagnosis not present

## 2020-03-07 DIAGNOSIS — I2510 Atherosclerotic heart disease of native coronary artery without angina pectoris: Secondary | ICD-10-CM | POA: Diagnosis not present

## 2020-03-07 DIAGNOSIS — U071 COVID-19: Secondary | ICD-10-CM | POA: Diagnosis not present

## 2020-03-07 DIAGNOSIS — E1122 Type 2 diabetes mellitus with diabetic chronic kidney disease: Secondary | ICD-10-CM | POA: Diagnosis not present

## 2020-03-07 DIAGNOSIS — I5030 Unspecified diastolic (congestive) heart failure: Secondary | ICD-10-CM | POA: Diagnosis not present

## 2020-03-07 DIAGNOSIS — J1282 Pneumonia due to coronavirus disease 2019: Secondary | ICD-10-CM | POA: Diagnosis not present

## 2020-03-07 DIAGNOSIS — I130 Hypertensive heart and chronic kidney disease with heart failure and stage 1 through stage 4 chronic kidney disease, or unspecified chronic kidney disease: Secondary | ICD-10-CM | POA: Diagnosis not present

## 2020-03-16 DIAGNOSIS — I2510 Atherosclerotic heart disease of native coronary artery without angina pectoris: Secondary | ICD-10-CM | POA: Diagnosis not present

## 2020-03-16 DIAGNOSIS — U071 COVID-19: Secondary | ICD-10-CM | POA: Diagnosis not present

## 2020-03-16 DIAGNOSIS — J1282 Pneumonia due to coronavirus disease 2019: Secondary | ICD-10-CM | POA: Diagnosis not present

## 2020-03-16 DIAGNOSIS — E1122 Type 2 diabetes mellitus with diabetic chronic kidney disease: Secondary | ICD-10-CM | POA: Diagnosis not present

## 2020-03-16 DIAGNOSIS — I5030 Unspecified diastolic (congestive) heart failure: Secondary | ICD-10-CM | POA: Diagnosis not present

## 2020-03-16 DIAGNOSIS — I130 Hypertensive heart and chronic kidney disease with heart failure and stage 1 through stage 4 chronic kidney disease, or unspecified chronic kidney disease: Secondary | ICD-10-CM | POA: Diagnosis not present

## 2020-04-04 DIAGNOSIS — Z992 Dependence on renal dialysis: Secondary | ICD-10-CM | POA: Diagnosis not present

## 2020-04-04 DIAGNOSIS — F319 Bipolar disorder, unspecified: Secondary | ICD-10-CM | POA: Diagnosis not present

## 2020-04-04 DIAGNOSIS — I2510 Atherosclerotic heart disease of native coronary artery without angina pectoris: Secondary | ICD-10-CM | POA: Diagnosis not present

## 2020-04-04 DIAGNOSIS — I130 Hypertensive heart and chronic kidney disease with heart failure and stage 1 through stage 4 chronic kidney disease, or unspecified chronic kidney disease: Secondary | ICD-10-CM | POA: Diagnosis not present

## 2020-04-04 DIAGNOSIS — Z794 Long term (current) use of insulin: Secondary | ICD-10-CM | POA: Diagnosis not present

## 2020-04-04 DIAGNOSIS — E1122 Type 2 diabetes mellitus with diabetic chronic kidney disease: Secondary | ICD-10-CM | POA: Diagnosis not present

## 2020-04-04 DIAGNOSIS — I5030 Unspecified diastolic (congestive) heart failure: Secondary | ICD-10-CM | POA: Diagnosis not present

## 2020-04-04 DIAGNOSIS — U071 COVID-19: Secondary | ICD-10-CM | POA: Diagnosis not present

## 2020-04-04 DIAGNOSIS — J1282 Pneumonia due to coronavirus disease 2019: Secondary | ICD-10-CM | POA: Diagnosis not present

## 2020-04-04 DIAGNOSIS — F0390 Unspecified dementia without behavioral disturbance: Secondary | ICD-10-CM | POA: Diagnosis not present

## 2020-04-04 DIAGNOSIS — E785 Hyperlipidemia, unspecified: Secondary | ICD-10-CM | POA: Diagnosis not present

## 2020-04-04 DIAGNOSIS — N186 End stage renal disease: Secondary | ICD-10-CM | POA: Diagnosis not present

## 2020-04-04 DIAGNOSIS — Z86711 Personal history of pulmonary embolism: Secondary | ICD-10-CM | POA: Diagnosis not present

## 2020-04-04 DIAGNOSIS — F339 Major depressive disorder, recurrent, unspecified: Secondary | ICD-10-CM | POA: Diagnosis not present

## 2020-04-05 DIAGNOSIS — I130 Hypertensive heart and chronic kidney disease with heart failure and stage 1 through stage 4 chronic kidney disease, or unspecified chronic kidney disease: Secondary | ICD-10-CM | POA: Diagnosis not present

## 2020-04-05 DIAGNOSIS — J1282 Pneumonia due to coronavirus disease 2019: Secondary | ICD-10-CM | POA: Diagnosis not present

## 2020-04-05 DIAGNOSIS — I2510 Atherosclerotic heart disease of native coronary artery without angina pectoris: Secondary | ICD-10-CM | POA: Diagnosis not present

## 2020-04-05 DIAGNOSIS — U071 COVID-19: Secondary | ICD-10-CM | POA: Diagnosis not present

## 2020-04-05 DIAGNOSIS — E1122 Type 2 diabetes mellitus with diabetic chronic kidney disease: Secondary | ICD-10-CM | POA: Diagnosis not present

## 2020-04-05 DIAGNOSIS — I5030 Unspecified diastolic (congestive) heart failure: Secondary | ICD-10-CM | POA: Diagnosis not present

## 2020-04-07 ENCOUNTER — Encounter (HOSPITAL_COMMUNITY): Payer: Self-pay

## 2020-04-07 ENCOUNTER — Other Ambulatory Visit: Payer: Self-pay

## 2020-04-07 ENCOUNTER — Emergency Department (HOSPITAL_COMMUNITY)
Admission: EM | Admit: 2020-04-07 | Discharge: 2020-04-07 | Disposition: A | Discharge status: Home / Self Care | Payer: No Typology Code available for payment source | Attending: Emergency Medicine | Admitting: Emergency Medicine

## 2020-04-07 DIAGNOSIS — I132 Hypertensive heart and chronic kidney disease with heart failure and with stage 5 chronic kidney disease, or end stage renal disease: Secondary | ICD-10-CM | POA: Diagnosis not present

## 2020-04-07 DIAGNOSIS — Z7901 Long term (current) use of anticoagulants: Secondary | ICD-10-CM | POA: Insufficient documentation

## 2020-04-07 DIAGNOSIS — I2510 Atherosclerotic heart disease of native coronary artery without angina pectoris: Secondary | ICD-10-CM | POA: Diagnosis not present

## 2020-04-07 DIAGNOSIS — Z955 Presence of coronary angioplasty implant and graft: Secondary | ICD-10-CM | POA: Insufficient documentation

## 2020-04-07 DIAGNOSIS — F17210 Nicotine dependence, cigarettes, uncomplicated: Secondary | ICD-10-CM | POA: Diagnosis not present

## 2020-04-07 DIAGNOSIS — E1122 Type 2 diabetes mellitus with diabetic chronic kidney disease: Secondary | ICD-10-CM | POA: Insufficient documentation

## 2020-04-07 DIAGNOSIS — T82838A Hemorrhage of vascular prosthetic devices, implants and grafts, initial encounter: Secondary | ICD-10-CM | POA: Diagnosis not present

## 2020-04-07 DIAGNOSIS — N186 End stage renal disease: Secondary | ICD-10-CM | POA: Diagnosis not present

## 2020-04-07 DIAGNOSIS — I5042 Chronic combined systolic (congestive) and diastolic (congestive) heart failure: Secondary | ICD-10-CM | POA: Insufficient documentation

## 2020-04-07 DIAGNOSIS — Z794 Long term (current) use of insulin: Secondary | ICD-10-CM | POA: Diagnosis not present

## 2020-04-07 DIAGNOSIS — E11610 Type 2 diabetes mellitus with diabetic neuropathic arthropathy: Secondary | ICD-10-CM | POA: Insufficient documentation

## 2020-04-07 DIAGNOSIS — Z8616 Personal history of COVID-19: Secondary | ICD-10-CM | POA: Insufficient documentation

## 2020-04-07 DIAGNOSIS — Z79899 Other long term (current) drug therapy: Secondary | ICD-10-CM | POA: Diagnosis not present

## 2020-04-07 DIAGNOSIS — Z992 Dependence on renal dialysis: Secondary | ICD-10-CM | POA: Insufficient documentation

## 2020-04-07 DIAGNOSIS — L7682 Other postprocedural complications of skin and subcutaneous tissue: Secondary | ICD-10-CM

## 2020-04-07 NOTE — ED Provider Notes (Signed)
[facility] EMERGENCY DEPARTMENT Provider Note  CSN: [id number] Arrival date & time: 04/07/20 0259  Chief Complaint(s) No chief complaint on file.  HPI Robin [name] is a 69 y.o. female with a history of ESRD on dialysis MWF who presents for bleeding from the fistula site.  Patient had bleeding immediately after having the site D accessed, requiring intervention by the center.  Hemostasis was obtained approximately 1 hour after initial bleed.  Patient was discharged home and did not have any issues up until tonight when she went to get up off the bed and the fistula site began to bleed again.  She attempted to apply pressure with towels which did not resolve the bleeding.  This prompted a call to EMS.  When they arrived they applied an alias bandage.  Patient denies any other physical complaints.  HPI  Past Medical History Past Medical History:  Diagnosis Date  . Acute delirium 11/18/2014  . Acute encephalopathy   . Acute on chronic respiratory failure with hypoxia ([place]) 09/09/2016  . Acute respiratory failure with hypoxia ([place]) 11/29/2015  . Anemia in chronic kidney disease 12/09/2014  . Anxiety   . Arthritis   . Benign hypertension   . Bipolar affective disorder ([place]) 12/09/2014  . CAD (coronary artery disease), native coronary artery with 2 stents  11/17/2014  . Charcot foot due to diabetes mellitus ([place])   . Chronic combined systolic and diastolic CHF (congestive heart failure) ([place]) 11/18/2014  . Closed left ankle fracture 11/17/2014  . Confusion 01/21/2015  . Depression   . Diabetes mellitus without complication ([place])   . End stage renal disease on dialysis ([place])   . Fracture dislocation of ankle 11/17/2014  . GERD (gastroesophageal reflux disease)   . Heart murmur   . History of blood transfusion   . History of bronchitis   . History of pneumonia   . Hyperlipidemia 03/26/2015  . Hypertension associated with diabetes ([place]) 11/18/2014  . Hypertensive heart/renal  disease with failure ([place]) 12/09/2014  . Hypokalemia 11/17/2014  . Multiple falls 01/21/2015  . Obesity 11/17/2014  . Onychomycosis 10/07/2016  . Right leg DVT ([place]) 12/05/2015  . SIRS (systemic inflammatory response syndrome) ([place]) 08/08/2016  . Sleep apnea   . Unstable angina ([place]) 12/26/2017  . Ventricular tachycardia [facility])    Patient Active Problem List   Diagnosis Date Noted  . COVID-19 01/10/2020  . Hypoxia 01/10/2020  . Chest pain 10/01/2019  . History of pulmonary embolism 10/01/2019  . Prolonged QT interval 10/01/2019  . Hypertensive urgency   . Other headache syndrome   . Orthostatic dizziness 04/15/2019  . Bradycardia 04/15/2019  . Syncope   . ESRD on hemodialysis ([place])   . Chest pain at rest 09/16/2018  . Diabetic Charct's arthropathy ([place]) 10/07/2016  . GERD (gastroesophageal reflux disease) 09/09/2016  . Depression 09/09/2016  . Chronic diastolic (congestive) heart failure ([place]) 09/09/2016  . Elevated troponin 09/09/2016  . Hyperlipidemia associated with type 2 diabetes mellitus ([place]) 03/26/2015  . Charcot ankle 03/16/2015  . Anemia, chronic renal failure   . NSVT (nonsustained ventricular tachycardia) ([place])   . Hypertension due to end stage renal disease caused by type 2 diabetes mellitus, on dialysis ([place]) 12/09/2014  . Bipolar affective disorder ([place]) 12/09/2014  . Diabetes mellitus type 2, insulin dependent ([place]) 11/17/2014  . CAD (coronary artery disease), native coronary artery with 2 stents  11/17/2014   Home Medication(s) Prior to Admission medications   Medication Sig Start Date End Date Taking?  Authorizing Provider  acetaminophen (TYLENOL) 325 MG tablet Take 325-975 mg by mouth every 6 (six) hours as needed for mild pain or headache.    [provider]  albuterol (VENTOLIN HFA) 108 (90 Base) MCG/ACT inhaler Inhale 1-2 puffs into the lungs every 6 (six) hours as needed for wheezing or shortness of breath.    [provider]  ammonium  lactate (LAC-HYDRIN) 12 % lotion Apply 1 application topically 2 (two) times daily as needed (for itching/irritation).     [provider]  ARIPiprazole (ABILIFY) 10 MG tablet Take 10 mg by mouth daily. Take with 2mg  tablet for a total of 12mg     [provider]  ARIPiprazole (ABILIFY) 2 MG tablet Take 2 mg by mouth daily. Take with 10mg  tablet for a total of 12mg     [provider]  atorvastatin (LIPITOR) 40 MG tablet Take 20 mg by mouth at bedtime.     [provider]  AURYXIA 1 GM 210 MG(Fe) tablet Take 210 mg by mouth daily. Patient not taking: Reported on 01/11/2020 02/22/19   [provider]  calcitRIOL (ROCALTROL) 0.5 MCG capsule Take 5 capsules (2.5 mcg total) by mouth Every Tuesday,Thursday,and Saturday with dialysis. 01/31/20   Ghimire, Henreitta Leber, MD  dextrose (GLUTOSE) 40 % GEL Take 1 Tube by mouth once as needed (for blood sugar less than 60).     [provider]  ELIQUIS 5 MG TABS tablet Take 2.5 mg by mouth 2 (two) times daily. 10/21/18   [provider]  gabapentin (NEURONTIN) 100 MG capsule Take 100 mg by mouth 2 (two) times daily.    [provider]  insulin lispro (HUMALOG) 100 UNIT/ML injection Inject 3-15 Units into the skin 3 (three) times daily before meals. Pt uses as needed per sliding scale:    150-200:  3  units 201-250:  5 units 251-300:  8 units 301-350:  10 units 351-400:  12 units and call MD 400+ 15 units    [provider]  isosorbide mononitrate (IMDUR) 60 MG 24 hr tablet Take 60 mg by mouth daily.     [provider]  Melatonin 5 MG TABS Take 10 mg by mouth at bedtime.     [provider]  metoprolol tartrate 75 MG TABS Take 75 mg by mouth 2 (two) times daily. 04/17/19   Daisy Floro, DO  multivitamin (RENA-VIT) TABS tablet Take 1 tablet by mouth at bedtime.  12/11/17   [provider]  nitroGLYCERIN (NITROSTAT) 0.4 MG SL tablet Place 0.4 mg under the  tongue every 5 (five) minutes as needed for chest pain.    [provider]  pantoprazole (PROTONIX) 20 MG tablet Take 20 mg by mouth 2 (two) times daily.     [provider]  Past Surgical History Past Surgical History:  Procedure Laterality Date  . A/V FISTULAGRAM N/A 01/03/2020   Procedure: A/V FISTULAGRAM;  Surgeon: Serafina Mitchell, MD;  Location: [facility] CV [facility];  Service: Cardiovascular;  Laterality: N/A;  . ABDOMINAL HYSTERECTOMY    . ANGIOPLASTY Left 01/26/2020   Procedure: LEFT ARM GRAPH THROMBECTOMY WITH BALLON ANGIOPLASTY;  Surgeon: Waynetta Sandy, MD;  Location: [facility];  Service: Vascular;  Laterality: Left;  . ANKLE CLOSED REDUCTION N/A 11/17/2014   Procedure: CLOSED REDUCTION ANKLE;  Surgeon: Earlie Server, MD;  Location: [facility];  Service: Orthopedics;  Laterality: N/A;  . ANKLE FUSION Left 03/16/2015   Procedure: Left Tibiocalcaneal Fusion;  Surgeon: Newt Minion, MD;  Location: [facility];  Service: Orthopedics;  Laterality: Left;  . APPLICATION OF WOUND VAC Right 08/06/2016   Procedure: APPLICATION OF PREVENA WOUND VAC;  Surgeon: Newt Minion, MD;  Location: [facility];  Service: Orthopedics;  Laterality: Right;  . AV FISTULA PLACEMENT Left [id number]   done at [place]     2 stent   . CHOLECYSTECTOMY    . CORONARY STENT PLACEMENT    . FISTULOGRAM Left 01/26/2020   Procedure: FISTULOGRAM;  Surgeon: Waynetta Sandy, MD;  Location: [facility];  Service: Vascular;  Laterality: Left;  . FOOT ARTHRODESIS Right 08/06/2016   Procedure: Right Foot Fusion Lisfranc Joint;  Surgeon: Newt Minion, MD;  Location: [facility];  Service: Orthopedics;  Laterality: Right;  . HARDWARE REMOVAL Left 03/16/2015   Procedure: Removal Hardware Left Ankle;  Surgeon: Newt Minion, MD;  Location: [facility];  Service: Orthopedics;   Laterality: Left;  . IR AV DIALY SHUNT INTRO NEEDLE/INTRACATH INITIAL W/PTA/IMG LEFT  01/05/2018  . IR US GUIDE VASC ACCESS LEFT  01/05/2018  . LEFT HEART CATH AND CORONARY ANGIOGRAPHY N/A 12/28/2017   Procedure: LEFT HEART CATH AND CORONARY ANGIOGRAPHY;  Surgeon: Burnell Blanks, MD;  Location: [facility];  Service: Cardiovascular;  Laterality: N/A;  . ORIF ANKLE FRACTURE Left 11/20/2014   Procedure: OPEN REDUCTION INTERNAL FIXATION (ORIF) ANKLE FRACTURE;  Surgeon: Renette Butters, MD;  Location: [facility];  Service: Orthopedics;  Laterality: Left;  . PERIPHERAL VASCULAR BALLOON ANGIOPLASTY  01/03/2020   Procedure: PERIPHERAL VASCULAR BALLOON ANGIOPLASTY;  Surgeon: Serafina Mitchell, MD;  Location: [facility] CV LAB;  Service: Cardiovascular;;  . TONSILLECTOMY     Family History Family History  Family history unknown: Yes    Social History Social History   Tobacco Use  . Smoking status: Current Every Day Smoker    Packs/day: 1.00    Types: Cigarettes  . Smokeless tobacco: Never Used  Substance Use Topics  . Alcohol use: No  . Drug use: No   Allergies Patient has no known allergies.  Review of Systems Review of Systems All other systems are reviewed and are negative for acute change except as noted in the HPI  Physical Exam Vital Signs  I have reviewed the triage vital signs BP 134/70 (BP Location: Right Arm)   Pulse 66   Temp 98.7 F (37.1 C) (Oral)   Resp 12   Ht 5\' 4"  (1.626 m)   Wt 69.9 kg   SpO2 98%   BMI 26.43 kg/m   Physical Exam Vitals reviewed.  Constitutional:      General: She is not in acute distress.    Appearance: She is well-developed. She is not diaphoretic.  HENT:     Head: Normocephalic  and atraumatic.     Right Ear: External ear normal.     Left Ear: External ear normal.     Nose: Nose normal.  Eyes:     General: No scleral icterus.    Conjunctiva/sclera: Conjunctivae normal.  Neck:     Trachea: Phonation normal.   Cardiovascular:     Rate and Rhythm: Normal rate and regular rhythm.     Arteriovenous access: left arteriovenous access is present.    Comments: Left brachial AVF with bandage in place. After removal, tape and gauze from dialysis center still in place. This was removed and a single bleeding site noted. Pulmonary:     Effort: Pulmonary effort is normal. No respiratory distress.     Breath sounds: No stridor.  Abdominal:     General: There is no distension.  Musculoskeletal:        General: Normal range of motion.     Cervical back: Normal range of motion.  Neurological:     Mental Status: She is alert and oriented to person, place, and time.  Psychiatric:        Behavior: Behavior normal.     ED Results and Treatments Labs (all labs ordered are listed, but only abnormal results are displayed) Labs Reviewed - No data to display                                                                                                                       EKG  EKG Interpretation  Date/Time:    Ventricular Rate:    PR Interval:    QRS Duration:   QT Interval:    QTC Calculation:   R Axis:     Text Interpretation:        Radiology No results found.  Pertinent labs & imaging results that were available during my care of the patient were reviewed by me and considered in my medical decision making (see chart for details).  Medications Ordered in ED Medications - No data to display                                                                                                                                  Procedures Procedures  (including critical care time)  Medical Decision Making / ED Course I have reviewed the nursing notes for this encounter and the patient's prior records (if available in EHR or on provided paperwork).   Robin  RICHA [name] was evaluated in Emergency Department on 04/07/2020 for the symptoms described in the history of present illness. She was evaluated in the  context of the global COVID-19 pandemic, which necessitated consideration that the patient might be at risk for infection with the SARS-CoV-2 virus that causes COVID-19. Institutional protocols and algorithms that pertain to the evaluation of patients at risk for COVID-19 are in a state of rapid change based on information released by regulatory bodies including the CDC and federal and state organizations. These policies and algorithms were followed during the patient's care in the ED.    Clinical Course as of Apr 07 742  Sat Apr 07, 2020  5300 Bleeding stopped with applied pressure for approximately 5 minutes.  Nonstick pressure dressing was applied.  Will monitor and reassess.   [PC]  0502 On reassessment, no bleeding noted.   [PC]    Clinical Course User Index [PC] Shacola Schussler, Grayce Sessions, MD     Final Clinical Impression(s) / ED Diagnoses Final diagnoses:  Bleeding at insertion site   The patient appears reasonably screened and/or stabilized for discharge and I doubt any other medical condition or other [facility] requiring further screening, evaluation, or treatment in the ED at this time prior to discharge. Safe for discharge with strict return precautions.  Disposition: Discharge  Condition: Good  I have discussed the results, Dx and Tx plan with the patient/family who expressed understanding and agree(s) with the plan. Discharge instructions discussed at length. The patient/family was given strict return precautions who verbalized understanding of the instructions. No further questions at time of discharge.    ED Discharge Orders    None       Follow Up: Center, [facility] [address] [place] [address] [phone]  Call  As needed       This chart was dictated using voice recognition software.  Despite best efforts to proofread,  errors can occur which can change the documentation meaning.   Fatima Blank, MD 04/07/20 [phone]

## 2020-04-07 NOTE — ED Notes (Addendum)
Pt aao4, gcs15, breathing nonlabored with symmetrical chest rise bilaterally. Pt hemodinamically stable, left arm fistula bleeding controlled, gauze and coband applied at this time. NADN. Daughter at bedside, side rails up, call bell in place.

## 2020-04-07 NOTE — ED Notes (Signed)
Pt stable at discharge,  Bleeding controlled at this time. Pt provided discharge instructions and discharged with family.

## 2020-04-07 NOTE — Discharge Instructions (Addendum)
If you notice bleeding again, please remove the bandage and apply direct pressure with a finger/thumb to the bleeding site. Hold for at least 5-10 minutes and slowly remove your finger. If bleeding is slowing down, repeat direct pressure until bleeding has stopped. If bleeding does not slow with direct pressure, please continue applying pressure and call 911 for additional assistance.  

## 2020-04-07 NOTE — ED Triage Notes (Signed)
Pt bib EMS for fistula bleeding. Pt was laying in bed when she rolled over and then felt blood pooling. Bled through 3-4 towels at home. EMS unsuccessfully tried direct pressure to stop the bleeding. An [place] bandage was placed and tightened.   Pt has complaints of left hand numbness, coldness, and tingling due to bandage placement. EMS report faint pulses in left hand present  MWF dialysis pt. Pt was dialyzed yesterday and reports that it took a couple of hours to control the bleeding  Vitals: BP: 140/86 HR: 65 SPO2: 96% RA Temp: 98.6 F

## 2020-04-17 DIAGNOSIS — I2510 Atherosclerotic heart disease of native coronary artery without angina pectoris: Secondary | ICD-10-CM | POA: Diagnosis not present

## 2020-04-17 DIAGNOSIS — J1282 Pneumonia due to coronavirus disease 2019: Secondary | ICD-10-CM | POA: Diagnosis not present

## 2020-04-17 DIAGNOSIS — E1122 Type 2 diabetes mellitus with diabetic chronic kidney disease: Secondary | ICD-10-CM | POA: Diagnosis not present

## 2020-04-17 DIAGNOSIS — I130 Hypertensive heart and chronic kidney disease with heart failure and stage 1 through stage 4 chronic kidney disease, or unspecified chronic kidney disease: Secondary | ICD-10-CM | POA: Diagnosis not present

## 2020-04-17 DIAGNOSIS — I5030 Unspecified diastolic (congestive) heart failure: Secondary | ICD-10-CM | POA: Diagnosis not present

## 2020-04-17 DIAGNOSIS — U071 COVID-19: Secondary | ICD-10-CM | POA: Diagnosis not present

## 2020-07-25 ENCOUNTER — Other Ambulatory Visit: Payer: Self-pay

## 2020-07-25 ENCOUNTER — Emergency Department (HOSPITAL_COMMUNITY)
Admission: EM | Admit: 2020-07-25 | Discharge: 2020-07-25 | Disposition: A | Discharge status: Home / Self Care | Payer: No Typology Code available for payment source | Source: Home / Self Care | Attending: Emergency Medicine | Admitting: Emergency Medicine

## 2020-07-25 DIAGNOSIS — Z955 Presence of coronary angioplasty implant and graft: Secondary | ICD-10-CM | POA: Insufficient documentation

## 2020-07-25 DIAGNOSIS — I2510 Atherosclerotic heart disease of native coronary artery without angina pectoris: Secondary | ICD-10-CM | POA: Insufficient documentation

## 2020-07-25 DIAGNOSIS — Z79899 Other long term (current) drug therapy: Secondary | ICD-10-CM | POA: Insufficient documentation

## 2020-07-25 DIAGNOSIS — I132 Hypertensive heart and chronic kidney disease with heart failure and with stage 5 chronic kidney disease, or end stage renal disease: Secondary | ICD-10-CM | POA: Insufficient documentation

## 2020-07-25 DIAGNOSIS — F17210 Nicotine dependence, cigarettes, uncomplicated: Secondary | ICD-10-CM | POA: Insufficient documentation

## 2020-07-25 DIAGNOSIS — I5042 Chronic combined systolic (congestive) and diastolic (congestive) heart failure: Secondary | ICD-10-CM | POA: Insufficient documentation

## 2020-07-25 DIAGNOSIS — Z794 Long term (current) use of insulin: Secondary | ICD-10-CM | POA: Insufficient documentation

## 2020-07-25 DIAGNOSIS — E1122 Type 2 diabetes mellitus with diabetic chronic kidney disease: Secondary | ICD-10-CM | POA: Insufficient documentation

## 2020-07-25 DIAGNOSIS — T82590A Other mechanical complication of surgically created arteriovenous fistula, initial encounter: Secondary | ICD-10-CM | POA: Insufficient documentation

## 2020-07-25 DIAGNOSIS — Z7901 Long term (current) use of anticoagulants: Secondary | ICD-10-CM | POA: Insufficient documentation

## 2020-07-25 DIAGNOSIS — T829XXA Unspecified complication of cardiac and vascular prosthetic device, implant and graft, initial encounter: Secondary | ICD-10-CM

## 2020-07-25 DIAGNOSIS — N186 End stage renal disease: Secondary | ICD-10-CM | POA: Insufficient documentation

## 2020-07-25 DIAGNOSIS — G928 Other toxic encephalopathy: Secondary | ICD-10-CM | POA: Diagnosis not present

## 2020-07-25 DIAGNOSIS — Z992 Dependence on renal dialysis: Secondary | ICD-10-CM | POA: Insufficient documentation

## 2020-07-25 LAB — CBC WITH DIFFERENTIAL/PLATELET
Abs Immature Granulocytes: 0.06 10*3/uL (ref 0.00–0.07)
Basophils Absolute: 0.1 10*3/uL (ref 0.0–0.1)
Basophils Relative: 1 %
Eosinophils Absolute: 0.1 10*3/uL (ref 0.0–0.5)
Eosinophils Relative: 1 %
HCT: 41.4 % (ref 36.0–46.0)
Hemoglobin: 12.2 g/dL (ref 12.0–15.0)
Immature Granulocytes: 1 %
Lymphocytes Relative: 14 %
Lymphs Abs: 1.3 10*3/uL (ref 0.7–4.0)
MCH: 29.7 pg (ref 26.0–34.0)
MCHC: 29.5 g/dL — ABNORMAL LOW (ref 30.0–36.0)
MCV: 100.7 fL — ABNORMAL HIGH (ref 80.0–100.0)
Monocytes Absolute: 1.2 10*3/uL — ABNORMAL HIGH (ref 0.1–1.0)
Monocytes Relative: 13 %
Neutro Abs: 6.6 10*3/uL (ref 1.7–7.7)
Neutrophils Relative %: 70 %
Platelets: 221 10*3/uL (ref 150–400)
RBC: 4.11 MIL/uL (ref 3.87–5.11)
RDW: 19.9 % — ABNORMAL HIGH (ref 11.5–15.5)
WBC: 9.3 10*3/uL (ref 4.0–10.5)
nRBC: 0 % (ref 0.0–0.2)

## 2020-07-25 LAB — BASIC METABOLIC PANEL
Anion gap: 18 — ABNORMAL HIGH (ref 5–15)
BUN: 32 mg/dL — ABNORMAL HIGH (ref 8–23)
CO2: 26 mmol/L (ref 22–32)
Calcium: 10.3 mg/dL (ref 8.9–10.3)
Chloride: 92 mmol/L — ABNORMAL LOW (ref 98–111)
Creatinine, Ser: 7.42 mg/dL — ABNORMAL HIGH (ref 0.44–1.00)
GFR, Estimated: 6 mL/min — ABNORMAL LOW (ref >60)
Glucose, Bld: 184 mg/dL — ABNORMAL HIGH (ref 70–99)
Potassium: 4.5 mmol/L (ref 3.5–5.1)
Sodium: 136 mmol/L (ref 135–145)

## 2020-07-25 LAB — TYPE AND SCREEN
ABO/RH(D): O POS
Antibody Screen: NEGATIVE

## 2020-07-25 LAB — PROTIME-INR
INR: 1.3 — ABNORMAL HIGH (ref 0.8–1.2)
Prothrombin Time: 15.4 seconds — ABNORMAL HIGH (ref 11.4–15.2)

## 2020-07-25 NOTE — ED Triage Notes (Signed)
Pt from home had dialysis today. States it started bleeding for the last 86mins. Pt is complaining of feeling light headed. Pressure dressing applied to fistula. Pt bleeding pretty heavily.

## 2020-07-25 NOTE — ED Provider Notes (Signed)
[facility] EMERGENCY DEPARTMENT Provider Note  CSN: [id number] Arrival date & time: 07/25/20 1304    History Chief Complaint  Patient presents with  . bleeding fistula    HPI  Robin [name] is a 70 y.o. female with history of ESRD on HD MWF had a normal session of dialysis today but after she returned home her dialysis fistula began bleeding. Dressing was placed by EMS but still bleeding on arrival when dressing was taken down. Another pressure dressing (gauze, coban and ACE wrap) placed by ED staff prior to my evaluation. Patient reports feeling lightheaded but denies CP or SOB. No syncope.    Past Medical History:  Diagnosis Date  . Acute delirium 11/18/2014  . Acute encephalopathy   . Acute on chronic respiratory failure with hypoxia ([place]) 09/09/2016  . Acute respiratory failure with hypoxia ([place]) 11/29/2015  . Anemia in chronic kidney disease 12/09/2014  . Anxiety   . Arthritis   . Benign hypertension   . Bipolar affective disorder ([place]) 12/09/2014  . CAD (coronary artery disease), native coronary artery with 2 stents  11/17/2014  . Charcot foot due to diabetes mellitus ([place])   . Chronic combined systolic and diastolic CHF (congestive heart failure) ([place]) 11/18/2014  . Closed left ankle fracture 11/17/2014  . Confusion 01/21/2015  . Depression   . Diabetes mellitus without complication ([place])   . End stage renal disease on dialysis ([place])   . Fracture dislocation of ankle 11/17/2014  . GERD (gastroesophageal reflux disease)   . Heart murmur   . History of blood transfusion   . History of bronchitis   . History of pneumonia   . Hyperlipidemia 03/26/2015  . Hypertension associated with diabetes ([place]) 11/18/2014  . Hypertensive heart/renal disease with failure ([place]) 12/09/2014  . Hypokalemia 11/17/2014  . Multiple falls 01/21/2015  . Obesity 11/17/2014  . Onychomycosis 10/07/2016  . Right leg DVT ([place]) 12/05/2015  . SIRS (systemic inflammatory response syndrome) ([place]) 08/08/2016  .  Sleep apnea   . Unstable angina ([place]) 12/26/2017  . Ventricular tachycardia [facility])     Past Surgical History:  Procedure Laterality Date  . A/V FISTULAGRAM N/A 01/03/2020   Procedure: A/V FISTULAGRAM;  Surgeon: Serafina Mitchell, MD;  Location: [facility];  Service: Cardiovascular;  Laterality: N/A;  . ABDOMINAL HYSTERECTOMY    . ANGIOPLASTY Left 01/26/2020   Procedure: LEFT ARM GRAPH THROMBECTOMY WITH BALLON ANGIOPLASTY;  Surgeon: Waynetta Sandy, MD;  Location: [facility];  Service: Vascular;  Laterality: Left;  . ANKLE CLOSED REDUCTION N/A 11/17/2014   Procedure: CLOSED REDUCTION ANKLE;  Surgeon: Earlie Server, MD;  Location: [facility];  Service: Orthopedics;  Laterality: N/A;  . ANKLE FUSION Left 03/16/2015   Procedure: Left Tibiocalcaneal Fusion;  Surgeon: Newt Minion, MD;  Location: [facility];  Service: Orthopedics;  Laterality: Left;  . APPLICATION OF WOUND VAC Right 08/06/2016   Procedure: APPLICATION OF PREVENA WOUND VAC;  Surgeon: Newt Minion, MD;  Location: [facility];  Service: Orthopedics;  Laterality: Right;  . AV FISTULA PLACEMENT Left [id number]   done at [place]     2 stent   . CHOLECYSTECTOMY    . CORONARY STENT PLACEMENT    . FISTULOGRAM Left 01/26/2020   Procedure: FISTULOGRAM;  Surgeon: Waynetta Sandy, MD;  Location: [facility];  Service: Vascular;  Laterality: Left;  . FOOT ARTHRODESIS Right 08/06/2016   Procedure: Right Foot Fusion Lisfranc Joint;  Surgeon: Newt Minion,  MD;  Location: [facility];  Service: Orthopedics;  Laterality: Right;  . HARDWARE REMOVAL Left 03/16/2015   Procedure: Removal Hardware Left Ankle;  Surgeon: Newt Minion, MD;  Location: [facility];  Service: Orthopedics;  Laterality: Left;  . IR AV DIALY SHUNT INTRO NEEDLE/INTRACATH INITIAL W/PTA/IMG LEFT  01/05/2018  . IR US GUIDE VASC ACCESS LEFT  01/05/2018  . LEFT HEART CATH AND CORONARY ANGIOGRAPHY N/A 12/28/2017   Procedure: LEFT HEART CATH AND CORONARY ANGIOGRAPHY;   Surgeon: Burnell Blanks, MD;  Location: [facility];  Service: Cardiovascular;  Laterality: N/A;  . ORIF ANKLE FRACTURE Left 11/20/2014   Procedure: OPEN REDUCTION INTERNAL FIXATION (ORIF) ANKLE FRACTURE;  Surgeon: Renette Butters, MD;  Location: [facility];  Service: Orthopedics;  Laterality: Left;  . PERIPHERAL VASCULAR BALLOON ANGIOPLASTY  01/03/2020   Procedure: PERIPHERAL VASCULAR BALLOON ANGIOPLASTY;  Surgeon: Serafina Mitchell, MD;  Location: [facility];  Service: Cardiovascular;;  . TONSILLECTOMY      Family History  Family history unknown: Yes    Social History   Tobacco Use  . Smoking status: Current Every Day Smoker    Packs/day: 1.00    Types: Cigarettes  . Smokeless tobacco: Never Used  Substance Use Topics  . Alcohol use: No  . Drug use: No     Home Medications Prior to Admission medications   Medication Sig Start Date End Date Taking? Authorizing Provider  acetaminophen (TYLENOL) 325 MG tablet Take 325-975 mg by mouth every 6 (six) hours as needed for mild pain or headache.   Yes [provider]  albuterol (VENTOLIN HFA) 108 (90 Base) MCG/ACT inhaler Inhale 1-2 puffs into the lungs every 6 (six) hours as needed for wheezing or shortness of breath.   Yes [provider]  ARIPiprazole (ABILIFY) 10 MG tablet Take 10 mg by mouth daily. Take with 2mg  tablet for a total of 12mg    Yes [provider]  ARIPiprazole (ABILIFY) 2 MG tablet Take 2 mg by mouth daily. Take with 10mg  tablet for a total of 12mg    Yes [provider]  atorvastatin (LIPITOR) 40 MG tablet Take 20 mg by mouth at bedtime.    Yes [provider]  AURYXIA 1 GM 210 MG(Fe) tablet Take 420 mg by mouth in the morning and at bedtime. 02/22/19  Yes [provider]  ELIQUIS 5 MG TABS tablet Take 2.5 mg by mouth 2 (two) times daily. 10/21/18  Yes [provider]  gabapentin (NEURONTIN) 100 MG capsule Take 100 mg by mouth 2 (two) times  daily.   Yes [provider]  insulin detemir (LEVEMIR FLEXTOUCH) 100 UNIT/ML FlexPen Inject 35 Units into the skin daily.   Yes [provider]  isosorbide mononitrate (IMDUR) 60 MG 24 hr tablet Take 60 mg by mouth daily.    Yes [provider]  liraglutide (VICTOZA) 18 MG/3ML SOPN Inject 1.2 mg into the skin daily.   Yes [provider]  Melatonin 5 MG TABS Take 5 mg by mouth at bedtime.   Yes [provider]  metoprolol tartrate 75 MG TABS Take 75 mg by mouth 2 (two) times daily. Patient taking differently: Take 112.5 mg by mouth 2 (two) times daily. 04/17/19  Yes Milus Banister C, DO  multivitamin (RENA-VIT) TABS tablet Take 1 tablet by mouth at bedtime.  12/11/17  Yes [provider]  nitroGLYCERIN (NITROSTAT) 0.4 MG SL tablet Place 0.4 mg under the tongue every 5 (five) minutes as  needed for chest pain.   Yes [provider]  pantoprazole (PROTONIX) 20 MG tablet Take 20 mg by mouth 2 (two) times daily.    Yes [provider]  calcitRIOL (ROCALTROL) 0.5 MCG capsule Take 5 capsules (2.5 mcg total) by mouth Every Tuesday,Thursday,and Saturday with dialysis. Patient not taking: No sig reported 01/31/20   Jonetta Osgood, MD  insulin lispro (HUMALOG) 100 UNIT/ML injection Inject 3-15 Units into the skin 3 (three) times daily before meals. Pt uses as needed per sliding scale:    150-200:  3  units 201-250:  5 units 251-300:  8 units 301-350:  10 units 351-400:  12 units and call MD 400+ 15 units Patient not taking: Reported on 07/25/2020    [provider]     Allergies    Patient has no known allergies.   Review of Systems   Review of Systems A comprehensive review of systems was completed and negative except as noted in HPI.    Physical Exam BP (!) 162/90   Pulse 72   Temp 97.8 F (36.6 C) (Oral)   Resp (!) 21   SpO2 98%   Physical Exam Vitals and nursing note reviewed.  Constitutional:       Appearance: Normal appearance.  HENT:     Head: Normocephalic and atraumatic.     Nose: Nose normal.     Mouth/Throat:     Mouth: Mucous membranes are moist.  Eyes:     Extraocular Movements: Extraocular movements intact.     Conjunctiva/sclera: Conjunctivae normal.  Cardiovascular:     Rate and Rhythm: Normal rate.  Pulmonary:     Effort: Pulmonary effort is normal.     Breath sounds: Normal breath sounds.  Abdominal:     General: Abdomen is flat.     Palpations: Abdomen is soft.     Tenderness: There is no abdominal tenderness.  Musculoskeletal:        General: No swelling. Normal range of motion.     Cervical back: Neck supple.     Comments: Dialysis fistula in LUE with palpable thrill, pressure dressing in place  Skin:    General: Skin is warm and dry.  Neurological:     General: No focal deficit present.     Mental Status: She is alert.  Psychiatric:        Mood and Affect: Mood normal.      ED Results / Procedures / Treatments   Labs (all labs ordered are listed, but only abnormal results are displayed) Labs Reviewed  BASIC METABOLIC PANEL - Abnormal; Notable for the following components:      Result Value   Chloride 92 (*)    Glucose, Bld 184 (*)    BUN 32 (*)    Creatinine, Ser 7.42 (*)    GFR, Estimated 6 (*)    Anion gap 18 (*)    All other components within normal limits  PROTIME-INR - Abnormal; Notable for the following components:   Prothrombin Time 15.4 (*)    INR 1.3 (*)    All other components within normal limits  CBC WITH DIFFERENTIAL/PLATELET - Abnormal; Notable for the following components:   MCV 100.7 (*)    MCHC 29.5 (*)    RDW 19.9 (*)    Monocytes Absolute 1.2 (*)    All other components within normal limits  TYPE AND SCREEN    EKG None  Radiology No results found.  Procedures Procedures  Medications Ordered in the  ED Medications - No data to display   MDM Rules/Calculators/A&P MDM Dressing taken down, there is a small  spot of bleeding, not high pressure, at the distal fistula puncture site. Pressure was held manually and a kerlix pressure dressing applied. Discussed with patient and daughter at bedside, concern for clotting of fistula if pressure is held too long or too tight. She is on blood thinners so hopefully this will not be a problem. Will recheck if bleeding has stopped and check labs.  ED Course  I have reviewed the triage vital signs and the nursing notes.  Pertinent labs & imaging results that were available during my care of the patient were reviewed by me and considered in my medical decision making (see chart for details).  Clinical Course as of 07/25/20 1504  Wed Jul 25, 2020  1410 No anemia, INR is not significantly elevated.  [CS]  1426 BMP consistent with ESRD no other significant abnormalities.  [CS]  1503 No bleeding after ED obs, plan discharge home. Advised to take dressing off later today.  [CS]    Clinical Course User Index [CS] Truddie Hidden, MD    Final Clinical Impression(s) / ED Diagnoses Final diagnoses:  Complication of arteriovenous dialysis fistula, initial encounter    Rx / DC Orders ED Discharge Orders    None       Truddie Hidden, MD 07/25/20 1504

## 2020-07-27 ENCOUNTER — Emergency Department (HOSPITAL_COMMUNITY): Payer: No Typology Code available for payment source

## 2020-07-27 ENCOUNTER — Other Ambulatory Visit: Payer: Self-pay

## 2020-07-27 ENCOUNTER — Encounter (HOSPITAL_COMMUNITY): Payer: Self-pay | Admitting: Pharmacy Technician

## 2020-07-27 ENCOUNTER — Inpatient Hospital Stay (HOSPITAL_COMMUNITY)
Admission: EM | Admit: 2020-07-27 | Discharge: 2020-07-28 | DRG: 091 | Disposition: A | Discharge status: Home / Self Care | Payer: No Typology Code available for payment source | Attending: Internal Medicine | Admitting: Internal Medicine

## 2020-07-27 ENCOUNTER — Inpatient Hospital Stay (HOSPITAL_COMMUNITY): Payer: No Typology Code available for payment source

## 2020-07-27 DIAGNOSIS — Z992 Dependence on renal dialysis: Secondary | ICD-10-CM | POA: Diagnosis not present

## 2020-07-27 DIAGNOSIS — E8889 Other specified metabolic disorders: Secondary | ICD-10-CM | POA: Diagnosis present

## 2020-07-27 DIAGNOSIS — Z20822 Contact with and (suspected) exposure to covid-19: Secondary | ICD-10-CM | POA: Diagnosis present

## 2020-07-27 DIAGNOSIS — F17210 Nicotine dependence, cigarettes, uncomplicated: Secondary | ICD-10-CM | POA: Diagnosis present

## 2020-07-27 DIAGNOSIS — E1122 Type 2 diabetes mellitus with diabetic chronic kidney disease: Secondary | ICD-10-CM | POA: Diagnosis present

## 2020-07-27 DIAGNOSIS — D631 Anemia in chronic kidney disease: Secondary | ICD-10-CM | POA: Diagnosis present

## 2020-07-27 DIAGNOSIS — K219 Gastro-esophageal reflux disease without esophagitis: Secondary | ICD-10-CM | POA: Diagnosis present

## 2020-07-27 DIAGNOSIS — E785 Hyperlipidemia, unspecified: Secondary | ICD-10-CM | POA: Diagnosis present

## 2020-07-27 DIAGNOSIS — F319 Bipolar disorder, unspecified: Secondary | ICD-10-CM | POA: Diagnosis present

## 2020-07-27 DIAGNOSIS — E1169 Type 2 diabetes mellitus with other specified complication: Secondary | ICD-10-CM | POA: Diagnosis present

## 2020-07-27 DIAGNOSIS — I132 Hypertensive heart and chronic kidney disease with heart failure and with stage 5 chronic kidney disease, or end stage renal disease: Secondary | ICD-10-CM | POA: Diagnosis present

## 2020-07-27 DIAGNOSIS — Z955 Presence of coronary angioplasty implant and graft: Secondary | ICD-10-CM

## 2020-07-27 DIAGNOSIS — E11610 Type 2 diabetes mellitus with diabetic neuropathic arthropathy: Secondary | ICD-10-CM | POA: Diagnosis present

## 2020-07-27 DIAGNOSIS — I5042 Chronic combined systolic (congestive) and diastolic (congestive) heart failure: Secondary | ICD-10-CM | POA: Diagnosis present

## 2020-07-27 DIAGNOSIS — N2581 Secondary hyperparathyroidism of renal origin: Secondary | ICD-10-CM | POA: Diagnosis present

## 2020-07-27 DIAGNOSIS — Z8249 Family history of ischemic heart disease and other diseases of the circulatory system: Secondary | ICD-10-CM | POA: Diagnosis not present

## 2020-07-27 DIAGNOSIS — R4182 Altered mental status, unspecified: Secondary | ICD-10-CM | POA: Diagnosis not present

## 2020-07-27 DIAGNOSIS — J449 Chronic obstructive pulmonary disease, unspecified: Secondary | ICD-10-CM | POA: Diagnosis present

## 2020-07-27 DIAGNOSIS — Z86718 Personal history of other venous thrombosis and embolism: Secondary | ICD-10-CM | POA: Diagnosis not present

## 2020-07-27 DIAGNOSIS — I2510 Atherosclerotic heart disease of native coronary artery without angina pectoris: Secondary | ICD-10-CM | POA: Diagnosis present

## 2020-07-27 DIAGNOSIS — Z833 Family history of diabetes mellitus: Secondary | ICD-10-CM

## 2020-07-27 DIAGNOSIS — Z794 Long term (current) use of insulin: Secondary | ICD-10-CM

## 2020-07-27 DIAGNOSIS — T402X5A Adverse effect of other opioids, initial encounter: Secondary | ICD-10-CM | POA: Diagnosis present

## 2020-07-27 DIAGNOSIS — Z9115 Patient's noncompliance with renal dialysis: Secondary | ICD-10-CM

## 2020-07-27 DIAGNOSIS — N186 End stage renal disease: Secondary | ICD-10-CM | POA: Diagnosis not present

## 2020-07-27 DIAGNOSIS — G4733 Obstructive sleep apnea (adult) (pediatric): Secondary | ICD-10-CM | POA: Diagnosis present

## 2020-07-27 DIAGNOSIS — I152 Hypertension secondary to endocrine disorders: Secondary | ICD-10-CM | POA: Diagnosis present

## 2020-07-27 DIAGNOSIS — Z86711 Personal history of pulmonary embolism: Secondary | ICD-10-CM

## 2020-07-27 DIAGNOSIS — Z7901 Long term (current) use of anticoagulants: Secondary | ICD-10-CM | POA: Diagnosis not present

## 2020-07-27 DIAGNOSIS — G928 Other toxic encephalopathy: Secondary | ICD-10-CM | POA: Diagnosis present

## 2020-07-27 DIAGNOSIS — F0390 Unspecified dementia without behavioral disturbance: Secondary | ICD-10-CM | POA: Diagnosis present

## 2020-07-27 DIAGNOSIS — Z79899 Other long term (current) drug therapy: Secondary | ICD-10-CM | POA: Diagnosis not present

## 2020-07-27 LAB — COMPREHENSIVE METABOLIC PANEL
ALT: 11 U/L (ref 0–44)
AST: 17 U/L (ref 15–41)
Albumin: 3 g/dL — ABNORMAL LOW (ref 3.5–5.0)
Alkaline Phosphatase: 84 U/L (ref 38–126)
Anion gap: 18 — ABNORMAL HIGH (ref 5–15)
BUN: 50 mg/dL — ABNORMAL HIGH (ref 8–23)
CO2: 26 mmol/L (ref 22–32)
Calcium: 10 mg/dL (ref 8.9–10.3)
Chloride: 93 mmol/L — ABNORMAL LOW (ref 98–111)
Creatinine, Ser: 10.8 mg/dL — ABNORMAL HIGH (ref 0.44–1.00)
GFR, Estimated: 4 mL/min — ABNORMAL LOW (ref >60)
Glucose, Bld: 110 mg/dL — ABNORMAL HIGH (ref 70–99)
Potassium: 4.6 mmol/L (ref 3.5–5.1)
Sodium: 137 mmol/L (ref 135–145)
Total Bilirubin: 1 mg/dL (ref 0.3–1.2)
Total Protein: 7.1 g/dL (ref 6.5–8.1)

## 2020-07-27 LAB — TROPONIN I (HIGH SENSITIVITY)
Troponin I (High Sensitivity): 47 ng/L — ABNORMAL HIGH (ref <18)
Troponin I (High Sensitivity): 57 ng/L — ABNORMAL HIGH (ref <18)

## 2020-07-27 LAB — CBC
HCT: 36.4 % (ref 36.0–46.0)
Hemoglobin: 10.9 g/dL — ABNORMAL LOW (ref 12.0–15.0)
MCH: 30.3 pg (ref 26.0–34.0)
MCHC: 29.9 g/dL — ABNORMAL LOW (ref 30.0–36.0)
MCV: 101.1 fL — ABNORMAL HIGH (ref 80.0–100.0)
Platelets: 224 10*3/uL (ref 150–400)
RBC: 3.6 MIL/uL — ABNORMAL LOW (ref 3.87–5.11)
RDW: 20.6 % — ABNORMAL HIGH (ref 11.5–15.5)
WBC: 14.6 10*3/uL — ABNORMAL HIGH (ref 4.0–10.5)
nRBC: 0.2 % (ref 0.0–0.2)

## 2020-07-27 LAB — RESP PANEL BY RT-PCR (FLU A&B, COVID) ARPGX2
Influenza A by PCR: NEGATIVE
Influenza B by PCR: NEGATIVE
SARS Coronavirus 2 by RT PCR: NEGATIVE

## 2020-07-27 LAB — HEMOGLOBIN A1C
Hgb A1c MFr Bld: 8.7 % — ABNORMAL HIGH (ref 4.8–5.6)
Mean Plasma Glucose: 202.99 mg/dL

## 2020-07-27 LAB — GLUCOSE, CAPILLARY
Glucose-Capillary: 36 mg/dL — CL (ref 70–99)
Glucose-Capillary: 163 mg/dL — ABNORMAL HIGH (ref 70–99)

## 2020-07-27 LAB — LACTIC ACID, PLASMA: Lactic Acid, Venous: 1.4 mmol/L (ref 0.5–1.9)

## 2020-07-27 LAB — LIPASE, BLOOD: Lipase: 23 U/L (ref 11–51)

## 2020-07-27 LAB — MAGNESIUM: Magnesium: 2.6 mg/dL — ABNORMAL HIGH (ref 1.7–2.4)

## 2020-07-27 MED ORDER — ALBUTEROL SULFATE HFA 108 (90 BASE) MCG/ACT IN AERS
1.0000 | INHALATION_SPRAY | Freq: Four times a day (QID) | RESPIRATORY_TRACT | Status: DC | PRN
  Filled 2020-07-27: qty 6.7

## 2020-07-27 MED ORDER — GUAIFENESIN ER 600 MG PO TB12
600.0000 mg | ORAL_TABLET | Freq: Two times a day (BID) | ORAL | Status: DC
  Administered 2020-07-27 – 2020-07-28 (×2): 600 mg via ORAL
  Filled 2020-07-27 (×2): qty 1

## 2020-07-27 MED ORDER — ARIPIPRAZOLE 2 MG PO TABS: 2.0000 mg | ORAL_TABLET | Freq: Every day | ORAL | Status: DC

## 2020-07-27 MED ORDER — ACETAMINOPHEN 650 MG RE SUPP: 650.0000 mg | Freq: Four times a day (QID) | RECTAL | Status: DC | PRN

## 2020-07-27 MED ORDER — ATORVASTATIN CALCIUM 40 MG PO TABS
40.0000 mg | ORAL_TABLET | Freq: Every day | ORAL | Status: DC
  Administered 2020-07-27: 40 mg via ORAL
  Filled 2020-07-27: qty 1

## 2020-07-27 MED ORDER — INSULIN ASPART 100 UNIT/ML ~~LOC~~ SOLN
0.0000 [IU] | Freq: Three times a day (TID) | SUBCUTANEOUS | Status: DC
  Administered 2020-07-28: 1 [IU] via SUBCUTANEOUS
  Administered 2020-07-28: 2 [IU] via SUBCUTANEOUS

## 2020-07-27 MED ORDER — GABAPENTIN 100 MG PO CAPS
100.0000 mg | ORAL_CAPSULE | Freq: Two times a day (BID) | ORAL | Status: DC
  Administered 2020-07-27 – 2020-07-28 (×2): 100 mg via ORAL
  Filled 2020-07-27 (×2): qty 1

## 2020-07-27 MED ORDER — ARIPIPRAZOLE 2 MG PO TABS
12.0000 mg | ORAL_TABLET | Freq: Every day | ORAL | Status: DC
  Administered 2020-07-27: 12 mg via ORAL
  Filled 2020-07-27 (×2): qty 1

## 2020-07-27 MED ORDER — UMECLIDINIUM BROMIDE 62.5 MCG/INH IN AEPB
1.0000 | INHALATION_SPRAY | Freq: Every day | RESPIRATORY_TRACT | Status: DC
  Filled 2020-07-27: qty 7

## 2020-07-27 MED ORDER — TIOTROPIUM BROMIDE MONOHYDRATE 1.25 MCG/ACT IN AERS: 2.0000 | INHALATION_SPRAY | Freq: Every morning | RESPIRATORY_TRACT | Status: DC

## 2020-07-27 MED ORDER — DEXTROSE 50 % IV SOLN
12.5000 g | INTRAVENOUS | Status: AC
  Administered 2020-07-28: 12.5 g via INTRAVENOUS
  Filled 2020-07-27: qty 50

## 2020-07-27 MED ORDER — ARIPIPRAZOLE 10 MG PO TABS: 10.0000 mg | ORAL_TABLET | Freq: Every day | ORAL | Status: DC

## 2020-07-27 MED ORDER — ACETAMINOPHEN 325 MG PO TABS: 650.0000 mg | ORAL_TABLET | Freq: Four times a day (QID) | ORAL | Status: DC | PRN

## 2020-07-27 MED ORDER — NICOTINE 7 MG/24HR TD PT24
7.0000 mg | MEDICATED_PATCH | Freq: Every day | TRANSDERMAL | Status: DC
  Administered 2020-07-28: 7 mg via TRANSDERMAL
  Filled 2020-07-27: qty 1

## 2020-07-27 MED ORDER — DEXTROSE 50 % IV SOLN
INTRAVENOUS | Status: AC
  Administered 2020-07-27: 50 mL
  Filled 2020-07-27: qty 50

## 2020-07-27 MED ORDER — APIXABAN 2.5 MG PO TABS
2.5000 mg | ORAL_TABLET | Freq: Two times a day (BID) | ORAL | Status: DC
  Administered 2020-07-27 – 2020-07-28 (×2): 2.5 mg via ORAL
  Filled 2020-07-27 (×4): qty 1

## 2020-07-27 MED ORDER — SODIUM CHLORIDE 0.9 % IV BOLUS
500.0000 mL | Freq: Once | INTRAVENOUS | Status: AC
  Administered 2020-07-27: 500 mL via INTRAVENOUS

## 2020-07-27 NOTE — ED Notes (Signed)
Pt asleep spo2 dropped to 86%, pt easily aroused, placed on 2l of o2

## 2020-07-27 NOTE — ED Notes (Signed)
PA-C at bedside unable to obtain IV site

## 2020-07-27 NOTE — Consult Note (Addendum)
[facility] Renal Consultation Note  Indication for Consultation:  Management of ESRD/hemodialysis; anemia, hypertension/volume and secondary hyperparathyroidism  HPI: Robin [name] is a 70 y.o. female with ESRD on chronic hemodialysis MWF([facility]) HO DMT2, CAD with stenting hypertension, dementia, bipolar disorder, sleep apnea, PE on Eliquis presents to the ER with altered mental status.  Seen in ER with her daughter giving history her mother is being seen at [place] beginning a week for cough Covid was ruled out, cough prescription with codeine per daughter only giving 1 tablespoon a day because of problems in the past with codeine causing MS changes.  Has not missed dialysis. This morning had  slurred speech and could not walk or get her self up out of bed to be ready for dialysis.  In ER =CT scan head=.  No acute intracranial  Abnormality Chest x-ray mild vascular congestion.  O2 sat 98% on room air Glucose 110, K 4.6, BUN 15, CR 10.8, WBC 14.6 Hgb 10.9, Covid negative   Patient poor historian with slurred speech, daughter gives history as above and denies fever chills shortness of breath chest pain, nausea vomiting diarrhea.  BP systolic 97 in ER  Noted was in the ER recently 3/02 for bleeding left forearm AV fistula after HD, bleeding occurred at home came to the ER resolved with correction by the ER team did not require VVS, noted on no heparin at dialysis, last AVF access flow okay.  I saw pt later in ER.  Somnolent with some borderline sats on O2-  But did arouse briefly -  Still coughing      Past Medical History:  Diagnosis Date  . Acute delirium 11/18/2014  . Acute encephalopathy   . Acute on chronic respiratory failure with hypoxia ([place]) 09/09/2016  . Acute respiratory failure with hypoxia ([place]) 11/29/2015  . Anemia in chronic kidney disease 12/09/2014  . Anxiety   . Arthritis   . Benign hypertension   . Bipolar affective disorder ([place]) 12/09/2014  . CAD (coronary  artery disease), native coronary artery with 2 stents  11/17/2014  . Charcot foot due to diabetes mellitus ([place])   . Chronic combined systolic and diastolic CHF (congestive heart failure) ([place]) 11/18/2014  . Closed left ankle fracture 11/17/2014  . Confusion 01/21/2015  . Depression   . Diabetes mellitus without complication ([place])   . End stage renal disease on dialysis ([place])   . Fracture dislocation of ankle 11/17/2014  . GERD (gastroesophageal reflux disease)   . Heart murmur   . History of blood transfusion   . History of bronchitis   . History of pneumonia   . Hyperlipidemia 03/26/2015  . Hypertension associated with diabetes ([place]) 11/18/2014  . Hypertensive heart/renal disease with failure ([place]) 12/09/2014  . Hypokalemia 11/17/2014  . Multiple falls 01/21/2015  . Obesity 11/17/2014  . Onychomycosis 10/07/2016  . Right leg DVT ([place]) 12/05/2015  . SIRS (systemic inflammatory response syndrome) ([place]) 08/08/2016  . Sleep apnea   . Unstable angina ([place]) 12/26/2017  . Ventricular tachycardia [facility])     Past Surgical History:  Procedure Laterality Date  . A/V FISTULAGRAM N/A 01/03/2020   Procedure: A/V FISTULAGRAM;  Surgeon: Serafina Mitchell, MD;  Location: [place] [facility];  Service: Cardiovascular;  Laterality: N/A;  . ABDOMINAL HYSTERECTOMY    . ANGIOPLASTY Left 01/26/2020   Procedure: LEFT ARM GRAPH THROMBECTOMY WITH BALLON ANGIOPLASTY;  Surgeon: Waynetta Sandy, MD;  Location: [place];  Service: Vascular;  Laterality: Left;  .  ANKLE CLOSED REDUCTION N/A 11/17/2014   Procedure: CLOSED REDUCTION ANKLE;  Surgeon: Earlie Server, MD;  Location: [place];  Service: Orthopedics;  Laterality: N/A;  . ANKLE FUSION Left 03/16/2015   Procedure: Left Tibiocalcaneal Fusion;  Surgeon: Newt Minion, MD;  Location: [place];  Service: Orthopedics;  Laterality: Left;  . APPLICATION OF WOUND VAC Right 08/06/2016   Procedure: APPLICATION OF PREVENA WOUND VAC;  Surgeon: Newt Minion, MD;  Location: [place];   Service: Orthopedics;  Laterality: Right;  . AV FISTULA PLACEMENT Left [id number]   done at [place]     2 stent   . CHOLECYSTECTOMY    . CORONARY STENT PLACEMENT    . FISTULOGRAM Left 01/26/2020   Procedure: FISTULOGRAM;  Surgeon: Waynetta Sandy, MD;  Location: [place];  Service: Vascular;  Laterality: Left;  . FOOT ARTHRODESIS Right 08/06/2016   Procedure: Right Foot Fusion Lisfranc Joint;  Surgeon: Newt Minion, MD;  Location: [place];  Service: Orthopedics;  Laterality: Right;  . HARDWARE REMOVAL Left 03/16/2015   Procedure: Removal Hardware Left Ankle;  Surgeon: Newt Minion, MD;  Location: [place];  Service: Orthopedics;  Laterality: Left;  . IR AV DIALY SHUNT INTRO NEEDLE/INTRACATH INITIAL W/PTA/IMG LEFT  01/05/2018  . IR US GUIDE VASC ACCESS LEFT  01/05/2018  . LEFT HEART CATH AND CORONARY ANGIOGRAPHY N/A 12/28/2017   Procedure: LEFT HEART CATH AND CORONARY ANGIOGRAPHY;  Surgeon: Burnell Blanks, MD;  Location: [place] [facility];  Service: Cardiovascular;  Laterality: N/A;  . ORIF ANKLE FRACTURE Left 11/20/2014   Procedure: OPEN REDUCTION INTERNAL FIXATION (ORIF) ANKLE FRACTURE;  Surgeon: Renette Butters, MD;  Location: [place];  Service: Orthopedics;  Laterality: Left;  . PERIPHERAL VASCULAR BALLOON ANGIOPLASTY  01/03/2020   Procedure: PERIPHERAL VASCULAR BALLOON ANGIOPLASTY;  Surgeon: Serafina Mitchell, MD;  Location: [place] [facility];  Service: Cardiovascular;;  . TONSILLECTOMY        Family History  Family history unknown: Yes      reports that she has been smoking cigarettes. She has been smoking about 1.00 pack per day. She has never used smokeless tobacco. She reports that she does not drink alcohol and does not use drugs.  No Known Allergies  Prior to Admission medications   Medication Sig Start Date End Date Taking? Authorizing Provider  acetaminophen (TYLENOL) 325 MG tablet Take 975 mg by mouth daily as needed for headache (pain).    Yes [provider]  albuterol (VENTOLIN HFA) 108 (90 Base) MCG/ACT inhaler Inhale 1-2 puffs into the lungs every 6 (six) hours as needed for wheezing or shortness of breath.   Yes [provider]  apixaban (ELIQUIS) 5 MG TABS tablet Take 2.5 mg by mouth 2 (two) times daily with a meal.   Yes [provider]  ARIPiprazole (ABILIFY) 2 MG tablet Take 2 mg by mouth at bedtime. Take with 1/2 20 mg tablet for a total dose of 12 mg   Yes [provider]  ARIPiprazole (ABILIFY) 20 MG tablet Take 10 mg by mouth at bedtime. Take with a 2 mg tablet for a total dose of 12 mg   Yes [provider]  atorvastatin (LIPITOR) 80 MG tablet Take 40 mg by mouth at bedtime.   Yes [provider]  benzonatate (TESSALON) 100 MG capsule Take by mouth 3 (three) times daily as needed for cough.   Yes [provider]  ferric citrate (AURYXIA) 1  GM 210 MG(Fe) tablet Take 420 mg by mouth 2 (two) times daily with a meal.   Yes [provider]  gabapentin (NEURONTIN) 100 MG capsule Take 100 mg by mouth 2 (two) times daily.   Yes [provider]  insulin aspart (NOVOLOG) 100 UNIT/ML injection Inject 6 Units into the skin 3 (three) times daily as needed for high blood sugar (CBG >300).   Yes [provider]  insulin detemir (LEVEMIR FLEXTOUCH) 100 UNIT/ML FlexPen Inject 35 Units into the skin daily with breakfast.   Yes [provider]  isosorbide mononitrate (IMDUR) 60 MG 24 hr tablet Take 60 mg by mouth daily with breakfast.   Yes [provider]  liraglutide (VICTOZA) 18 MG/3ML SOPN Inject 1.2 mg into the skin daily with breakfast.   Yes [provider]  Melatonin 5 MG TABS Take 10 mg by mouth at bedtime.   Yes [provider]  metoprolol tartrate (LOPRESSOR) 50 MG tablet Take 75 mg by mouth 2 (two) times daily with a meal.   Yes [provider]  multivitamin (RENA-VIT) TABS tablet Take 1 tablet  by mouth at bedtime.  12/11/17  Yes [provider]  nitroGLYCERIN (NITROSTAT) 0.4 MG SL tablet Place 0.4 mg under the tongue every 5 (five) minutes as needed for chest pain.   Yes [provider]  pantoprazole (PROTONIX) 40 MG tablet Take 40 mg by mouth 2 (two) times daily before a meal.   Yes [provider]  Tiotropium Bromide Monohydrate (SPIRIVA RESPIMAT) 1.25 MCG/ACT AERS Inhale 2 puffs into the lungs every morning.   Yes [provider]  metoprolol tartrate 75 MG TABS Take 75 mg by mouth 2 (two) times daily. Patient not taking: No sig reported 04/17/19   Daisy Floro, DO     Anti-infectives (From admission, onward)   None      Results for orders placed or performed during the hospital encounter of 07/27/20 (from the past 48 hour(s))  CBC     Status: Abnormal   Collection Time: 07/27/20  9:37 AM  Result Value Ref Range   WBC 14.6 (H) 4.0 - 10.5 K/uL   RBC 3.60 (L) 3.87 - 5.11 MIL/uL   Hemoglobin 10.9 (L) 12.0 - 15.0 g/dL   HCT 36.4 36.0 - 46.0 %   MCV 101.1 (H) 80.0 - 100.0 fL   MCH 30.3 26.0 - 34.0 pg   MCHC 29.9 (L) 30.0 - 36.0 g/dL   RDW 20.6 (H) 11.5 - 15.5 %   Platelets 224 150 - 400 K/uL   nRBC 0.2 0.0 - 0.2 %    Comment: Performed at [facility], [address], [place], [place] [address]  Lipase, blood     Status: None   Collection Time: 07/27/20  9:48 AM  Result Value Ref Range   Lipase 23 11 - 51 U/L    Comment: Performed at [facility] [address], [place], [place] [address]  Troponin I (High Sensitivity)     Status: Abnormal   Collection Time: 07/27/20  9:48 AM  Result Value Ref Range   Troponin I (High Sensitivity) 47 (H) <18 ng/L    Comment: (NOTE) Elevated high sensitivity troponin I (hsTnI) values and significant  changes across serial measurements may suggest ACS but many other  chronic and acute conditions are known to elevate hsTnI results.  Refer to the "Links" section for chest pain  algorithms and additional  guidance. Performed at [facility], [place] [address], [place], [place] [address]   Magnesium     Status: Abnormal   Collection Time: 07/27/20  9:48 AM  Result Value Ref Range   Magnesium 2.6 (H) 1.7 - 2.4 mg/dL    Comment: Performed at [facility] [address] [place], [place] [address]  Comprehensive metabolic panel     Status: Abnormal   Collection Time: 07/27/20  9:48 AM  Result Value Ref Range   Sodium 137 135 - 145 mmol/L   Potassium 4.6 3.5 - 5.1 mmol/L   Chloride 93 (L) 98 - 111 mmol/L   CO2 26 22 - 32 mmol/L   Glucose, Bld 110 (H) 70 - 99 mg/dL    Comment: Glucose reference range applies only to samples taken after fasting for at least 8 hours.   BUN 50 (H) 8 - 23 mg/dL   Creatinine, Ser 10.80 (H) 0.44 - 1.00 mg/dL   Calcium 10.0 8.9 - 10.3 mg/dL   Total Protein 7.1 6.5 - 8.1 g/dL   Albumin 3.0 (L) 3.5 - 5.0 g/dL   AST 17 15 - 41 U/L   ALT 11 0 - 44 U/L   Alkaline Phosphatase 84 38 - 126 U/L   Total Bilirubin 1.0 0.3 - 1.2 mg/dL   GFR, Estimated 4 (L) >60 mL/min    Comment: (NOTE) Calculated using the CKD-EPI Creatinine Equation (2021)    Anion gap 18 (H) 5 - 15    Comment: Performed at [facility], [place] [address], [place],  [address]  Resp Panel by RT-PCR (Flu A&B, Covid) Nasopharyngeal Swab     Status: None   Collection Time: 07/27/20  9:49 AM   Specimen: Nasopharyngeal Swab; Nasopharyngeal(NP) swabs in vial transport medium  Result Value Ref Range   SARS Coronavirus 2 by RT PCR NEGATIVE NEGATIVE    Comment: (NOTE) SARS-CoV-2 target nucleic acids are NOT DETECTED.  The SARS-CoV-2 RNA is generally detectable in upper respiratory specimens during the acute phase of infection. The lowest concentration of SARS-CoV-2 viral copies this assay can detect is 138 copies/mL. A negative result does not preclude SARS-Cov-2 infection and should not be used as the sole basis for treatment or other patient management  decisions. A negative result may occur with  improper specimen collection/handling, submission of specimen other than nasopharyngeal swab, presence of viral mutation(s) within the areas targeted by this assay, and inadequate number of viral copies(<138 copies/mL). A negative result must be combined with clinical observations, patient history, and epidemiological information. The expected result is Negative.  Fact Sheet for Patients:  EntrepreneurPulse.com.au  Fact Sheet for Healthcare Providers:  IncredibleEmployment.be  This test is no t yet approved or cleared by the [place] FDA and  has been authorized for detection and/or diagnosis of SARS-CoV-2 by FDA under an Emergency Use Authorization (EUA). This EUA will remain  in effect (meaning this test can be used) for the duration of the COVID-19 declaration under Section 564(b)(1) of the Act, 21 U.S.C.section 360bbb-3(b)(1), unless the authorization is terminated  or revoked sooner.       Influenza A by PCR NEGATIVE NEGATIVE   Influenza B by PCR NEGATIVE NEGATIVE    Comment: (NOTE) The Xpert Xpress SARS-CoV-2/FLU/RSV plus assay is intended as an aid in the diagnosis of influenza from Nasopharyngeal swab specimens and should not be used as a sole basis for treatment. Nasal washings and aspirates are unacceptable for Xpert Xpress SARS-CoV-2/FLU/RSV testing.  Fact Sheet for Patients: EntrepreneurPulse.com.au  Fact Sheet for Healthcare Providers: IncredibleEmployment.be  This test is not yet approved or cleared by the [place] FDA and has been authorized for detection and/or diagnosis of SARS-CoV-2 by FDA under an Emergency Use Authorization (EUA). This EUA will remain in effect (meaning this test can be used) for the duration of the COVID-19 declaration under Section 564(b)(1) of the Act, 21 U.S.C. section 360bbb-3(b)(1), unless the authorization  is terminated or revoked.  Performed at [facility], [place] [address], [place], [place] [address]   Lactic acid, plasma     Status: None   Collection Time: 07/27/20  1:15 PM  Result Value Ref Range   Lactic Acid, Venous 1.4 0.5 - 1.9 mmol/L    Comment: Performed at [facility] [address], [place], [place] [address]    ROS: As in HPI Physical Exam: Vitals:   07/27/20 1325 07/27/20 1400  BP: 97/78 (!) 90/59  Pulse: 94 89  Resp: 18 16  Temp:    SpO2: 97% 94%     General: NAD WN elderly female NAD, slightly slurred speech, HEENT: [place], EOMI, PERRLA, nonicteric, MMM Neck: Supple no JVD Heart: RRR soft 1/6 stock ejection murmur, no gallop no rub Lungs: Scattered coarse breath sounds, nonlabored breathing on room air, 98% percent O2 sat Abdomen: BS+, soft ND NT, no ascites Extremities: No pedal edema Skin: No overt rash Neuro: Slightly lethargic her normal mental status, garbled speech, moves all extremities independently my exam Dialysis Access: Positive bruit left upper arm AV fistula no edema no induration  Dialysis Orders: Center: [facility]  on MWF. EDW 68 kg HD Bath 2K, 2 CA time 3.5-hour Heparin none. Access left UA AV fistula     Hec 7 mcg IV/HD  No Mircera or iron  Assessment/Plan 1. AMS-work-up per admit, questionable codeine effect 2. ESRD -HD MWF, HD today on schedule via left upper arm AVF 3. Hypertension/volume  -attempt 2 L UF with dialysis-congestion on chest x-ray continue Imdur, metoprolol for now but may be able to taper off continue low blood pressure 4. Anemia of ESRD-Hgb 11.7 no ESA at center, follow-up trend 5. Metabolic bone disease -calcium elevated on admission hold Hectorol, follow-up calcium phosphorus trend continue binders with meals when eating 6. Nutrition -renal carb modified diet, albumin 3.0 give Nepro 7. History of PE on Eliquis 8. DM T2-per admit 9. History of CAD-Imdur metoprolol aspirin Ernest Haber, PA-C [place] [phone] 07/27/2020, 2:59 PM   Patient seen and examined, agree with above note with above modifications. ESRD patient who has presented with MS change in the setting of consuming cough syrup with codeine.  Other work up seeming negative.  Planning on having patient undergo routine HD tonight to see if she clears.  Otherwise appropriate titration of dialysis related meds-  UF for the mild pulm edema noted on CXR Corliss Parish, MD 07/27/2020

## 2020-07-27 NOTE — ED Triage Notes (Signed)
Pt brought here by daughter with concerns for stroke. Per daughter, pt is not at her baseline. Daughter states pt normally dresses herself and today pt stayed in the bed. Daughter states pt with cough for the last few weeks. Daughter states she attempted to give her some juice this morning and pt had difficulty swallowing. Daughter states her speech is also slurred. Family reports yesterday pt was more tired as well. Pt was due for dialysis today.

## 2020-07-27 NOTE — ED Notes (Signed)
Patient transported to CT 

## 2020-07-27 NOTE — ED Notes (Signed)
IV team at pt bedside 

## 2020-07-27 NOTE — ED Notes (Signed)
MRI updated that pt will go to 3w20 when done

## 2020-07-27 NOTE — ED Notes (Signed)
Attempted x 2, unable to obtain IV site

## 2020-07-27 NOTE — ED Notes (Signed)
Pt departed ED for MRI

## 2020-07-27 NOTE — ED Provider Notes (Signed)
[facility] EMERGENCY DEPARTMENT Provider Note   CSN: [id number] Arrival date & time: 07/27/20  9147     History Chief Complaint  Patient presents with  . Altered Mental Status    Robin [name] is a 70 y.o. female.  He has a history of end-stage renal disease and gets dialysis Monday Wednesday Friday.  Lives at home with her daughter.  Presents by EMS for change in mental status.  Daughter is giving most of the history due to the patient's lethargy.  Level 5 caveat.  Cough for 2 weeks.  Patient is not COVID vaccinated.  Has had multiple negative Covid test.  Has followed at the [place] for the cough and has tried numerous cough suppressants without any improvement.  No recent antibiotics.  Daughter today noticed the patient was very lethargic and did not get herself up to dress or get ready for dialysis.  She thinks there may be some right facial droop.  Globally weak.  Difficulty swallowing some juice today.  Usually the patient is awake and alert and able to give a full history.  Daughter states she seen her like this before when she is sick.  The history is provided by the patient, the EMS personnel and a relative.  Altered Mental Status Presenting symptoms: lethargy   Severity:  Moderate Most recent episode:  Today Episode history:  Single Timing:  Constant Progression:  Unchanged Chronicity:  Recurrent Context: not head injury, taking medications as prescribed and not nursing home resident   Associated symptoms: abnormal movement (increased twitching), slurred speech and weakness (generalized)   Associated symptoms: no abdominal pain, no difficulty breathing, no fever, no headaches, no nausea, no rash, no seizures and no vomiting        Past Medical History:  Diagnosis Date  . Acute delirium 11/18/2014  . Acute encephalopathy   . Acute on chronic respiratory failure with hypoxia ([place]) 09/09/2016  . Acute respiratory failure with hypoxia ([place]) 11/29/2015  . Anemia in  chronic kidney disease 12/09/2014  . Anxiety   . Arthritis   . Benign hypertension   . Bipolar affective disorder ([place]) 12/09/2014  . CAD (coronary artery disease), native coronary artery with 2 stents  11/17/2014  . Charcot foot due to diabetes mellitus ([place])   . Chronic combined systolic and diastolic CHF (congestive heart failure) ([place]) 11/18/2014  . Closed left ankle fracture 11/17/2014  . Confusion 01/21/2015  . Depression   . Diabetes mellitus without complication ([place])   . End stage renal disease on dialysis ([place])   . Fracture dislocation of ankle 11/17/2014  . GERD (gastroesophageal reflux disease)   . Heart murmur   . History of blood transfusion   . History of bronchitis   . History of pneumonia   . Hyperlipidemia 03/26/2015  . Hypertension associated with diabetes ([place]) 11/18/2014  . Hypertensive heart/renal disease with failure ([place]) 12/09/2014  . Hypokalemia 11/17/2014  . Multiple falls 01/21/2015  . Obesity 11/17/2014  . Onychomycosis 10/07/2016  . Right leg DVT ([place]) 12/05/2015  . SIRS (systemic inflammatory response syndrome) ([place]) 08/08/2016  . Sleep apnea   . Unstable angina ([place]) 12/26/2017  . Ventricular tachycardia [name])     Patient Active Problem List   Diagnosis Date Noted  . COVID-19 01/10/2020  . Hypoxia 01/10/2020  . Chest pain 10/01/2019  . History of pulmonary embolism 10/01/2019  . Prolonged QT interval 10/01/2019  . Hypertensive urgency   . Other headache syndrome   . Orthostatic dizziness  04/15/2019  . Bradycardia 04/15/2019  . Syncope   . ESRD on hemodialysis ([place])   . Chest pain at rest 09/16/2018  . Diabetic Charct's arthropathy ([place]) 10/07/2016  . GERD (gastroesophageal reflux disease) 09/09/2016  . Depression 09/09/2016  . Chronic diastolic (congestive) heart failure ([place]) 09/09/2016  . Elevated troponin 09/09/2016  . Hyperlipidemia associated with type 2 diabetes mellitus ([place]) 03/26/2015  . Charcot ankle 03/16/2015  . Anemia, chronic renal  failure   . NSVT (nonsustained ventricular tachycardia) ([place])   . Hypertension due to end stage renal disease caused by type 2 diabetes mellitus, on dialysis ([place]) 12/09/2014  . Bipolar affective disorder ([place]) 12/09/2014  . Diabetes mellitus type 2, insulin dependent ([place]) 11/17/2014  . CAD (coronary artery disease), native coronary artery with 2 stents  11/17/2014    Past Surgical History:  Procedure Laterality Date  . A/V FISTULAGRAM N/A 01/03/2020   Procedure: A/V FISTULAGRAM;  Surgeon: Serafina Mitchell, MD;  Location: [place] [facility];  Service: Cardiovascular;  Laterality: N/A;  . ABDOMINAL HYSTERECTOMY    . ANGIOPLASTY Left 01/26/2020   Procedure: LEFT ARM GRAPH THROMBECTOMY WITH BALLON ANGIOPLASTY;  Surgeon: Waynetta Sandy, MD;  Location: [place];  Service: Vascular;  Laterality: Left;  . ANKLE CLOSED REDUCTION N/A 11/17/2014   Procedure: CLOSED REDUCTION ANKLE;  Surgeon: Earlie Server, MD;  Location: [place];  Service: Orthopedics;  Laterality: N/A;  . ANKLE FUSION Left 03/16/2015   Procedure: Left Tibiocalcaneal Fusion;  Surgeon: Newt Minion, MD;  Location: [place];  Service: Orthopedics;  Laterality: Left;  . APPLICATION OF WOUND VAC Right 08/06/2016   Procedure: APPLICATION OF PREVENA WOUND VAC;  Surgeon: Newt Minion, MD;  Location: [place];  Service: Orthopedics;  Laterality: Right;  . AV FISTULA PLACEMENT Left [id number]   done at [facility]     2 stent   . CHOLECYSTECTOMY    . CORONARY STENT PLACEMENT    . FISTULOGRAM Left 01/26/2020   Procedure: FISTULOGRAM;  Surgeon: Waynetta Sandy, MD;  Location: [place];  Service: Vascular;  Laterality: Left;  . FOOT ARTHRODESIS Right 08/06/2016   Procedure: Right Foot Fusion Lisfranc Joint;  Surgeon: Newt Minion, MD;  Location: [place];  Service: Orthopedics;  Laterality: Right;  . HARDWARE REMOVAL Left 03/16/2015   Procedure: Removal Hardware Left Ankle;  Surgeon: Newt Minion, MD;  Location:  [place];  Service: Orthopedics;  Laterality: Left;  . IR AV DIALY SHUNT INTRO NEEDLE/INTRACATH INITIAL W/PTA/IMG LEFT  01/05/2018  . IR US GUIDE VASC ACCESS LEFT  01/05/2018  . LEFT HEART CATH AND CORONARY ANGIOGRAPHY N/A 12/28/2017   Procedure: LEFT HEART CATH AND CORONARY ANGIOGRAPHY;  Surgeon: Burnell Blanks, MD;  Location: [place] [facility];  Service: Cardiovascular;  Laterality: N/A;  . ORIF ANKLE FRACTURE Left 11/20/2014   Procedure: OPEN REDUCTION INTERNAL FIXATION (ORIF) ANKLE FRACTURE;  Surgeon: Renette Butters, MD;  Location: [place];  Service: Orthopedics;  Laterality: Left;  . PERIPHERAL VASCULAR BALLOON ANGIOPLASTY  01/03/2020   Procedure: PERIPHERAL VASCULAR BALLOON ANGIOPLASTY;  Surgeon: Serafina Mitchell, MD;  Location: [place] [facility];  Service: Cardiovascular;;  . TONSILLECTOMY       OB History   No obstetric history on file.     Family History  Family history unknown: Yes    Social History   Tobacco Use  . Smoking status: Current Every Day Smoker    Packs/day: 1.00    Types:  Cigarettes  . Smokeless tobacco: Never Used  Substance Use Topics  . Alcohol use: No  . Drug use: No    Home Medications Prior to Admission medications   Medication Sig Start Date End Date Taking? Authorizing Provider  acetaminophen (TYLENOL) 325 MG tablet Take 325-975 mg by mouth every 6 (six) hours as needed for mild pain or headache.    [provider]  albuterol (VENTOLIN HFA) 108 (90 Base) MCG/ACT inhaler Inhale 1-2 puffs into the lungs every 6 (six) hours as needed for wheezing or shortness of breath.    [provider]  ARIPiprazole (ABILIFY) 10 MG tablet Take 10 mg by mouth daily. Take with 2mg  tablet for a total of 12mg     [provider]  ARIPiprazole (ABILIFY) 2 MG tablet Take 2 mg by mouth daily. Take with 10mg  tablet for a total of 12mg     [provider]  atorvastatin (LIPITOR) 40 MG tablet Take 20 mg by mouth at bedtime.     [provider]  AURYXIA 1 GM 210 MG(Fe) tablet Take 420 mg by mouth in the morning and at bedtime. 02/22/19   [provider]  calcitRIOL (ROCALTROL) 0.5 MCG capsule Take 5 capsules (2.5 mcg total) by mouth Every Tuesday,Thursday,and Saturday with dialysis. Patient not taking: No sig reported 01/31/20   Ghimire, Henreitta Leber, MD  ELIQUIS 5 MG TABS tablet Take 2.5 mg by mouth 2 (two) times daily. 10/21/18   [provider]  gabapentin (NEURONTIN) 100 MG capsule Take 100 mg by mouth 2 (two) times daily.    [provider]  insulin detemir (LEVEMIR FLEXTOUCH) 100 UNIT/ML FlexPen Inject 35 Units into the skin daily.    [provider]  insulin lispro (HUMALOG) 100 UNIT/ML injection Inject 3-15 Units into the skin 3 (three) times daily before meals. Pt uses as needed per sliding scale:    150-200:  3  units 201-250:  5 units 251-300:  8 units 301-350:  10 units 351-400:  12 units and call MD 400+ 15 units Patient not taking: Reported on 07/25/2020    [provider]  isosorbide mononitrate (IMDUR) 60 MG 24 hr tablet Take 60 mg by mouth daily.     [provider]  liraglutide (VICTOZA) 18 MG/3ML SOPN Inject 1.2 mg into the skin daily.    [provider]  Melatonin 5 MG TABS Take 5 mg by mouth at bedtime.    [provider]  metoprolol tartrate 75 MG TABS Take 75 mg by mouth 2 (two) times daily. Patient taking differently: Take 112.5 mg by mouth 2 (two) times daily. 04/17/19   Daisy Floro, DO  multivitamin (RENA-VIT) TABS tablet Take 1 tablet by mouth at bedtime.  12/11/17   [provider]  nitroGLYCERIN (NITROSTAT) 0.4 MG SL tablet Place 0.4 mg under the tongue every 5 (five) minutes as needed for chest pain.    [provider]  pantoprazole (PROTONIX) 20 MG tablet Take 20 mg by mouth 2 (two) times daily.     [provider]    Allergies    Patient has no known allergies.  Review of Systems    Review of Systems  Constitutional: Negative for fever.  HENT: Positive for trouble swallowing. Negative for sore throat.   Eyes: Negative for visual disturbance.  Respiratory: Positive for cough. Negative for shortness of breath.   Cardiovascular: Negative for chest pain.  Gastrointestinal: Negative for abdominal pain, nausea and vomiting.  Genitourinary: Negative for  dysuria.  Musculoskeletal: Negative for joint swelling.  Skin: Negative for rash.  Neurological: Positive for tremors and weakness (generalized). Negative for seizures and headaches.    Physical Exam Updated Vital Signs BP (!) 126/96   Pulse (!) 102   Temp 99.5 F (37.5 C) (Oral)   Resp (!) 23   SpO2 91%   Physical Exam Vitals and nursing note reviewed.  Constitutional:      General: She is not in acute distress.    Appearance: Normal appearance. She is well-developed and well-nourished.  HENT:     Head: Normocephalic and atraumatic.  Eyes:     Conjunctiva/sclera: Conjunctivae normal.  Cardiovascular:     Rate and Rhythm: Regular rhythm. Tachycardia present.     Pulses: Normal pulses.     Heart sounds: No murmur heard.   Pulmonary:     Effort: Pulmonary effort is normal. No respiratory distress.     Breath sounds: Rhonchi present.  Abdominal:     Palpations: Abdomen is soft.     Tenderness: There is no abdominal tenderness.  Musculoskeletal:        General: No deformity, signs of injury or edema. Normal range of motion.     Cervical back: Neck supple.  Skin:    General: Skin is warm and dry.     Capillary Refill: Capillary refill takes less than 2 seconds.  Neurological:     General: No focal deficit present.     Mental Status: She is lethargic.     Comments: Patient is somnolent and will awaken and answer some questions to voice or stimulation.  Quickly falls back to sleep.  Not complying with neurologic exam although moving everything nonfocal he.  No obvious facial droop or slurred speech.   Psychiatric:        Mood and Affect: Mood and affect normal.     ED Results / Procedures / Treatments   Labs (all labs ordered are listed, but only abnormal results are displayed) Labs Reviewed  CBC - Abnormal; Notable for the following components:      Result Value   WBC 14.6 (*)    RBC 3.60 (*)    Hemoglobin 10.9 (*)    MCV 101.1 (*)    MCHC 29.9 (*)    RDW 20.6 (*)    All other components within normal limits  MAGNESIUM - Abnormal; Notable for the following components:   Magnesium 2.6 (*)    All other components within normal limits  COMPREHENSIVE METABOLIC PANEL - Abnormal; Notable for the following components:   Chloride 93 (*)    Glucose, Bld 110 (*)    BUN 50 (*)    Creatinine, Ser 10.80 (*)    Albumin 3.0 (*)    GFR, Estimated 4 (*)    Anion gap 18 (*)    All other components within normal limits  HEMOGLOBIN A1C - Abnormal; Notable for the following components:   Hgb A1c MFr Bld 8.7 (*)    All other components within normal limits  TROPONIN I (HIGH SENSITIVITY) - Abnormal; Notable for the following components:   Troponin I (High Sensitivity) 47 (*)    All other components within normal limits  TROPONIN I (HIGH SENSITIVITY) - Abnormal; Notable for the following components:   Troponin I (High Sensitivity) 57 (*)    All other components within normal limits  RESP PANEL BY RT-PCR (FLU A&B, COVID) ARPGX2  CULTURE, BLOOD (ROUTINE X 2)  LIPASE, BLOOD  LACTIC ACID, PLASMA  URINALYSIS,  ROUTINE W REFLEX MICROSCOPIC  BASIC METABOLIC PANEL  CBC  HIV ANTIBODY (ROUTINE TESTING W REFLEX)  TSH    EKG EKG Interpretation  Date/Time:  Friday July 27 2020 09:34:15 EST Ventricular Rate:  103 PR Interval:  166 QRS Duration: 86 QT Interval:  394 QTC Calculation: 516 R Axis:   -57 Text Interpretation: Sinus tachycardia with frequent Premature ventricular complexes Left axis deviation Minimal voltage criteria for LVH, may be normal variant ( Cornell product ) Anterior  infarct , age undetermined ST & T wave abnormality, consider lateral ischemia Abnormal ECG increased PVCs since prior 8/21 Confirmed by Aletta Edouard [phone]) on 07/27/2020 9:55:41 AM   Radiology CT Head Wo Contrast  Result Date: 07/27/2020 CLINICAL DATA:  Altered mental status EXAM: CT HEAD WITHOUT CONTRAST TECHNIQUE: Contiguous axial images were obtained from the base of the skull through the vertex without intravenous contrast. COMPARISON:  01/08/2020 FINDINGS: Brain: No evidence of acute infarction, hemorrhage, hydrocephalus, extra-axial collection or mass lesion/mass effect. Vascular: Density is noted throughout the vascular structures raising suspicion for recent contrast administration. Alternatively this may be to dehydration. Skull: Normal. Negative for fracture or focal lesion. Sinuses/Orbits: No acute finding. Other: None. IMPRESSION: No acute intracranial abnormality is noted. Relative increased density in the vascular structures is noted which may be related to recent contrast administration or dehydration. Electronically Signed   By: Inez Catalina M.D.   On: 07/27/2020 10:59   MR BRAIN WO CONTRAST  Result Date: 07/27/2020 CLINICAL DATA:  Neuro deficit, acute stroke suspected. EXAM: MRI HEAD WITHOUT CONTRAST TECHNIQUE: Multiplanar, multiecho pulse sequences of the brain and surrounding structures were obtained without intravenous contrast. COMPARISON:  MRI head March 16, 2016.  CT head from the same day. FINDINGS: Brain: No acute infarction, hemorrhage, hydrocephalus, extra-axial collection or mass lesion. Slight progression of diffuse cerebral atrophy with ex vacuo ventricular dilation. Mild for age T2/FLAIR hyperintensities within the white matter, most likely related to chronic microvascular ischemic disease. Vascular: Major arterial flow voids are maintained at the skull base. Skull and upper cervical spine: Normal marrow signal. Sinuses/Orbits: Sinuses are largely clear.  Unremarkable  orbits. Other: No mastoid effusions. IMPRESSION: 1. No acute intracranial abnormality.  No acute infarct. 2. Generalized cerebral atrophy. Electronically Signed   By: Margaretha Sheffield MD   On: 07/27/2020 18:29   DG Chest Port 1 View  Result Date: 07/27/2020 CLINICAL DATA:  Weakness EXAM: PORTABLE CHEST 1 VIEW COMPARISON:  01/10/2020 FINDINGS: Cardiac shadow is mildly enlarged but stable. Mild vascular congestion is seen but improved from the prior exam. Vascular stenting in the left arm is noted. No bony abnormality is seen. IMPRESSION: Mild vascular congestion improved from the prior exam. Electronically Signed   By: Inez Catalina M.D.   On: 07/27/2020 10:45    Procedures Procedures   Medications Ordered in ED Medications  insulin aspart (novoLOG) injection 0-9 Units (has no administration in time range)  acetaminophen (TYLENOL) tablet 650 mg (has no administration in time range)    Or  acetaminophen (TYLENOL) suppository 650 mg (has no administration in time range)  guaiFENesin (MUCINEX) 12 hr tablet 600 mg (has no administration in time range)  nicotine (NICODERM CQ - dosed in mg/24 hr) patch 7 mg (has no administration in time range)  atorvastatin (LIPITOR) tablet 40 mg (has no administration in time range)  apixaban (ELIQUIS) tablet 2.5 mg (has no administration in time range)  gabapentin (NEURONTIN) capsule 100 mg (has no administration in time range)  albuterol (VENTOLIN HFA)  108 (90 Base) MCG/ACT inhaler 1-2 puff (has no administration in time range)  ARIPiprazole (ABILIFY) tablet 12 mg (has no administration in time range)  umeclidinium bromide (INCRUSE ELLIPTA) 62.5 MCG/INH 1 puff (has no administration in time range)  sodium chloride 0.9 % bolus 500 mL (0 mLs Intravenous Stopped 07/27/20 1428)    ED Course  I have reviewed the triage vital signs and the nursing notes.  Pertinent labs & imaging results that were available during my care of the patient were reviewed by me and  considered in my medical decision making (see chart for details).  Clinical Course as of 07/27/20 1859  Fri Jul 27, 2020  1048 Patient's chest x-ray interpreted by me as cardiomegaly mild congestion no obvious infiltrates. [MB]  6144 Reviewed work-up with daughter.  She is in agreement the patient would benefit from admission to further work-up her change in mental status lethargy.  She wonders if the Rob[facility]uss[facility] may be a contributor to her sleepiness.  She said she gave her doses once daily for the last 4 days.  She said she seen her before get like this with any type of pain medicine [MB]  1333 Discussed with physician from the internal medicine teaching service who will evaluate the patient for admission. [MB]    Clinical Course User Index [MB] Hayden Rasmussen, MD   MDM Rules/Calculators/A&P                         [place] was evaluated in Emergency Department on 07/27/2020 for the symptoms described in the history of present illness. She was evaluated in the context of the global COVID-19 pandemic, which necessitated consideration that the patient might be at risk for infection with the SARS-CoV-2 virus that causes COVID-19. Institutional protocols and algorithms that pertain to the evaluation of patients at risk for COVID-19 are in a state of rapid change based on information released by regulatory bodies including the CDC and federal and state organizations. These policies and algorithms were followed during the patient's care in the ED.  This patient complains of altered mental status, lethargy; this involves an extensive number of treatment Options and is a complaint that carries with it a high risk of complications and Morbidity. The differential includes infection, metabolic derangement, intoxication, overmedication, stroke, bleed  I ordered, reviewed and interpreted labs, which included CBC with elevated white count, stable hemoglobin,  chemistries consistent with end-stage renal disease, Covid testing negative I ordered medication IV fluids I ordered imaging studies which included chest x-ray and head CT and I independently    visualized and interpreted imaging which showed mild fluid overload, head CT with no acute findings Additional history obtained from EMS and patient's daughter Previous records obtained and reviewed in epic, no recent admissions I consulted internal medicine teaching service and discussed lab and imaging findings  Critical Interventions: None  After the interventions stated above, I reevaluated the patient and found patient to remain lethargic although hemodynamically stable.  Will need admission for further work-up.   Final Clinical Impression(s) / ED Diagnoses Final diagnoses:  Altered mental status, unspecified altered mental status type  ESRD (end stage renal disease) ([place])    Rx / DC Orders ED Discharge Orders    None       Hayden Rasmussen, MD 07/27/20 1902

## 2020-07-27 NOTE — H&P (Signed)
Date: 07/27/2020               Patient Name:  Robin [name] MRN: [id number]  DOB: 09-08-1950 Age / Sex: 70 y.o., female   PCP: [place] Service: Internal Medicine Teaching Service         Attending Physician: Dr. Velna Ochs, MD    First Contact: Dr. Johnney Ou Pager: [phone]  Second Contact: Dr. Marianna Payment Pager: [phone]       After Hours (After 5p/  First Contact Pager: [phone]  weekends / holidays): Second Contact Pager: [phone]   Chief Complaint: Altered mental status  History of Present Illness: This is a 70 year old female with a history of ESRD on HD MWF, DM, HTN, HLD, combined HF, bipolar, early dementia, COPD presenting for altered mental status.   Patients daughter in room and helped with her story.    Patient has been coughing for a few weeks, she went down to the [place] and had testing done, told that it was not a cold and not pneumonia. Patient was given cough syrup, with codeine in it. She had been trying mucinex and other over the counter medications with no relief. Monday she overslept for her dialysis, Tuesday she continued to have cough. Her dialysis port messed up so she ended up here.   This morning she overslept, felt like she was less responsive, had to be woken up for dialysis, she was less responsive. She started that she was getting up, but she wouldn't move or get up herself. She doesn't know what medications she took or if she took extra cough syrup. She states that she is not at her baseline.   She went on Wednesday for her dialysis, she has not missed many dialysis sessions.   Around 8 this morning she noticed right sided facial sloping, right arm pain, and some slurred speech. She states that it's been a few years since this occurred. She had to be placed in a wheelchair to get here. She states that by the time she arrived at the hospital her symptoms improved.   Uses a cane at home.   Lives with son, has multiple  family members that live around the area that come and help. She is able to eat and dress herself, the daughter helps with her medications and appointments.   Meds:  Current Meds  Medication Sig  . acetaminophen (TYLENOL) 325 MG tablet Take 975 mg by mouth daily as needed for headache (pain).  Marland Kitchen albuterol (VENTOLIN HFA) 108 (90 Base) MCG/ACT inhaler Inhale 1-2 puffs into the lungs every 6 (six) hours as needed for wheezing or shortness of breath.  Marland Kitchen apixaban (ELIQUIS) 5 MG TABS tablet Take 2.5 mg by mouth 2 (two) times daily with a meal.  . ARIPiprazole (ABILIFY) 2 MG tablet Take 2 mg by mouth at bedtime. Take with 1/2 20 mg tablet for a total dose of 12 mg  . ARIPiprazole (ABILIFY) 20 MG tablet Take 10 mg by mouth at bedtime. Take with a 2 mg tablet for a total dose of 12 mg  . atorvastatin (LIPITOR) 80 MG tablet Take 40 mg by mouth at bedtime.  . benzonatate (TESSALON) 100 MG capsule Take by mouth 3 (three) times daily as needed for cough.  . ferric citrate (AURYXIA) 1 GM 210 MG(Fe) tablet Take 420 mg by mouth 2 (two) times daily with a meal.  . gabapentin (NEURONTIN) 100  MG capsule Take 100 mg by mouth 2 (two) times daily.  . insulin aspart (NOVOLOG) 100 UNIT/ML injection Inject 6 Units into the skin 3 (three) times daily as needed for high blood sugar (CBG >300).  . insulin detemir (LEVEMIR FLEXTOUCH) 100 UNIT/ML FlexPen Inject 35 Units into the skin daily with breakfast.  . isosorbide mononitrate (IMDUR) 60 MG 24 hr tablet Take 60 mg by mouth daily with breakfast.  . liraglutide (VICTOZA) 18 MG/3ML SOPN Inject 1.2 mg into the skin daily with breakfast.  . Melatonin 5 MG TABS Take 10 mg by mouth at bedtime.  . metoprolol tartrate (LOPRESSOR) 50 MG tablet Take 75 mg by mouth 2 (two) times daily with a meal.  . multivitamin (RENA-VIT) TABS tablet Take 1 tablet by mouth at bedtime.   . nitroGLYCERIN (NITROSTAT) 0.4 MG SL tablet Place 0.4 mg under the tongue every 5 (five) minutes as needed for  chest pain.  . pantoprazole (PROTONIX) 40 MG tablet Take 40 mg by mouth 2 (two) times daily before a meal.  . Tiotropium Bromide Monohydrate (SPIRIVA RESPIMAT) 1.25 MCG/ACT AERS Inhale 2 puffs into the lungs every morning.    Allergies: Allergies as of 07/27/2020  . (No Known Allergies)   Past Medical History:  Diagnosis Date  . Acute delirium 11/18/2014  . Acute encephalopathy   . Acute on chronic respiratory failure with hypoxia ([place]) 09/09/2016  . Acute respiratory failure with hypoxia ([place]) 11/29/2015  . Anemia in chronic kidney disease 12/09/2014  . Anxiety   . Arthritis   . Benign hypertension   . Bipolar affective disorder ([place]) 12/09/2014  . CAD (coronary artery disease), native coronary artery with 2 stents  11/17/2014  . Charcot foot due to diabetes mellitus ([place])   . Chronic combined systolic and diastolic CHF (congestive heart failure) ([place]) 11/18/2014  . Closed left ankle fracture 11/17/2014  . Confusion 01/21/2015  . Depression   . Diabetes mellitus without complication ([place])   . End stage renal disease on dialysis ([place])   . Fracture dislocation of ankle 11/17/2014  . GERD (gastroesophageal reflux disease)   . Heart murmur   . History of blood transfusion   . History of bronchitis   . History of pneumonia   . Hyperlipidemia 03/26/2015  . Hypertension associated with diabetes ([place]) 11/18/2014  . Hypertensive heart/renal disease with failure ([place]) 12/09/2014  . Hypokalemia 11/17/2014  . Multiple falls 01/21/2015  . Obesity 11/17/2014  . Onychomycosis 10/07/2016  . Right leg DVT ([place]) 12/05/2015  . SIRS (systemic inflammatory response syndrome) ([place]) 08/08/2016  . Sleep apnea   . Unstable angina ([place]) 12/26/2017  . Ventricular tachycardia ([place])    Family History:  Extensive family history of cardiac disease, diabetes, hyperlipidemia.   Social History:  Smoked for >30 years, can be up to 2 packs per day, down to a few packs recently. Denies any EtOH use or recreational drug  use.  Patient lives alone but has family with her for most of the day.  Is able to dress herself.  Unable to perform other ADLs without assistance.  Uses a cane to assist with ambulation as well as sometimes uses a wheelchair.  Review of Systems: A complete ROS was negative except as per HPI.   Physical Exam: Limited due to patient's lethargy Blood pressure (!) 118/55, pulse 87, temperature 99.5 F (37.5 C), temperature source Oral, resp. rate 16, SpO2 92 %. Constitutional: Resting comfortably. HENT: normocephalic atraumatic. Eyes: conjunctiva non-erythematous.  Pupils equal round reactive to  light. Neck: supple Cardiovascular: regular rate and rhythm, no m/r/g Pulmonary/Chest: normal work of breathing on room air, lungs clear to auscultation bilaterally.  Auscultated anteriorly only. Abdominal: soft, non-tender. MSK: normal bulk and tone.  Left upper extremity fistula.  With palpable thrill.  No erythema, warmness, tenderness around the area. Neurological: Lethargic.  Orientation and rest of neurological exam difficult to assess in setting patient's lethargy. Skin: warm and dry  EKG: personally reviewed my interpretation is normal sinus rhythm with frequent PVCs and compared to prior from 08/21 patient has increasing amounts of PVCs.  CXR: IMPRESSION: Mild vascular congestion improved from the prior exam.  Assessment & Plan by Problem: Active Problems:   AMS (altered mental status)   Altered mental status Patient with past medical history of dementia, currently adherent with her aripiprazole.  Patient's daughter endorses that for the past week the patient has overslept on her dialysis days.  The daughter states that this is abnormal for the patient.  The patient had worsening of lethargy with family having difficulty with arousal. The patient's daughter notes that this is occurred in the past in which patient has taken regular dosing of opioids.  She has required hospitalization for  washout in the setting of her ESRD.  Patient's daughter has been giving patient tablespoons of cough syrup containing codeine for the past few days.  His daughter states she had to hide cough syrup from patient to prevent excessive use.  Daughter does not think patient was able to find the bottle but is unsure. Other possible etiologies are infection or new CVA.  Patient with elevated white count of 14.6, will perform blood cultures and urinalysis with culture.  Covid and flu negative.  Chest x-ray negative for pneumonia.  No lactic acidosis.  Patient's daughter does endorse some right-sided facial droop and right-sided weakness that has resolved since.  Despite negative CT imaging, patient has multiple risk factors for CVA.  Will perform MRI brain without contrast. -Continue to monitor mental status -BC/UC pending, continue to monitor for infectious etiology -MRI brain without contrast pending  -Remain n.p.o. till cleared by SLP or bedside swallow eval  ESRD Followed by [facility].  Missed dialysis today.  Has been seen by Dr. Moshe Cipro since admission, plan for dialysis this evening.  Her assistance in this patient's care. -Dialysis this evening -Daily BMPs  Cough Patient has had a cough for 2 weeks.  Was seen by primary care provider at the [facility], and daughter states had unremarkable work-up.  Was prescribed codeine for continued cough.  Chest x-ray without evidence for pneumonia.  In setting of white count we will continue to monitor.  Patient does have history of COPD and does continue to smoke, which I suspect is contributing to her cough. -We will continue to monitor -Stop codeine  Tobacco use: Patient continues to smoke, has been diagnosed with COPD in the past.  Daughter notes patient has decreased from 2 packs per day to a few cigarettes a day.  Do suspect this is contributing to her chronic cough -Consider nicotine patch once mental status stable  OSA:  Patient has had sleep  study in the past and was diagnosed with sleep apnea.  Does not wear CPAP at home.  Was found to be hypoxic while sleeping. -Continue to encourage OSA -Continue supplemental oxygen when sleeping for hypoxia  HLD Continue atorvastatin 40 mg  Type 2 Diabetes Insulin-dependent A1c today of 8.7.  On measurement of NovoLog 6 units 3 times daily with  meals and Levemir 35 units nightly -SSI  Hx of Combined Systolic/Diastolic HF Patient with history of heart failure most recent echo in our system from 2020.  Daughter states medical care is at the [facility].  Home medication of Imdur 60 mg daily, metoprolol tartrate 50 mg daily and Nitrostat as needed for chest pain.  Patient's daughter notes that patient has not used nitro in a while.  We will hold medications in setting of patient's low blood pressures. -Continue Imdur and Lopressor when pressures normalize or patient becomes hypertensive. -Strict I&O's -Monitor volume status daily   Dispo: Admit patient to Inpatient with expected length of stay greater than 2 midnights.  SignedRiesa Pope, MD 07/27/2020, 6:45 PM  Pager: [phone] After 5pm on weekdays and 1pm on weekends: On Call pager: [phone]

## 2020-07-27 NOTE — Progress Notes (Signed)
Pt transferred to room, vitals taken, tele on, oxygen nasal cannula at 2L on, patient resting comfortably, bed in lowest position.

## 2020-07-28 DIAGNOSIS — N186 End stage renal disease: Secondary | ICD-10-CM

## 2020-07-28 DIAGNOSIS — R4182 Altered mental status, unspecified: Secondary | ICD-10-CM

## 2020-07-28 LAB — GLUCOSE, CAPILLARY
Glucose-Capillary: 102 mg/dL — ABNORMAL HIGH (ref 70–99)
Glucose-Capillary: 132 mg/dL — ABNORMAL HIGH (ref 70–99)
Glucose-Capillary: 148 mg/dL — ABNORMAL HIGH (ref 70–99)
Glucose-Capillary: 170 mg/dL — ABNORMAL HIGH (ref 70–99)
Glucose-Capillary: 46 mg/dL — ABNORMAL LOW (ref 70–99)
Glucose-Capillary: 53 mg/dL — ABNORMAL LOW (ref 70–99)

## 2020-07-28 LAB — CBC
HCT: 33.4 % — ABNORMAL LOW (ref 36.0–46.0)
Hemoglobin: 10.2 g/dL — ABNORMAL LOW (ref 12.0–15.0)
MCH: 30.3 pg (ref 26.0–34.0)
MCHC: 30.5 g/dL (ref 30.0–36.0)
MCV: 99.1 fL (ref 80.0–100.0)
Platelets: 202 10*3/uL (ref 150–400)
RBC: 3.37 MIL/uL — ABNORMAL LOW (ref 3.87–5.11)
RDW: 20.3 % — ABNORMAL HIGH (ref 11.5–15.5)
WBC: 8.2 10*3/uL (ref 4.0–10.5)
nRBC: 0 % (ref 0.0–0.2)

## 2020-07-28 LAB — BASIC METABOLIC PANEL
Anion gap: 11 (ref 5–15)
BUN: 9 mg/dL (ref 8–23)
CO2: 30 mmol/L (ref 22–32)
Calcium: 8.6 mg/dL — ABNORMAL LOW (ref 8.9–10.3)
Chloride: 94 mmol/L — ABNORMAL LOW (ref 98–111)
Creatinine, Ser: 3.78 mg/dL — ABNORMAL HIGH (ref 0.44–1.00)
GFR, Estimated: 12 mL/min — ABNORMAL LOW (ref >60)
Glucose, Bld: 113 mg/dL — ABNORMAL HIGH (ref 70–99)
Potassium: 3 mmol/L — ABNORMAL LOW (ref 3.5–5.1)
Sodium: 135 mmol/L (ref 135–145)

## 2020-07-28 LAB — HIV ANTIBODY (ROUTINE TESTING W REFLEX): HIV Screen 4th Generation wRfx: NONREACTIVE

## 2020-07-28 LAB — TSH: TSH: 1.291 u[IU]/mL (ref 0.350–4.500)

## 2020-07-28 MED ORDER — DEXTROSE 50 % IV SOLN: 12.5000 g | Freq: Once | INTRAVENOUS | Status: AC

## 2020-07-28 MED ORDER — FERRIC CITRATE 1 GM 210 MG(FE) PO TABS
420.0000 mg | ORAL_TABLET | Freq: Three times a day (TID) | ORAL | Status: DC
Start: 2020-07-28 — End: 2020-07-28
  Administered 2020-07-28: 420 mg via ORAL
  Filled 2020-07-28 (×3): qty 2

## 2020-07-28 MED ORDER — DEXTROSE 50 % IV SOLN
INTRAVENOUS | Status: AC
  Administered 2020-07-28: 50 mL
  Filled 2020-07-28: qty 50

## 2020-07-28 NOTE — Evaluation (Signed)
Clinical/Bedside Swallow Evaluation Patient Details  Name: Robin [name] MRN: [id number] Date of Birth: [birth year]  Today's Date: 07/28/2020 Time: SLP Start Time (ACUTE ONLY): 0825 SLP Stop Time (ACUTE ONLY): 0842 SLP Time Calculation (min) (ACUTE ONLY): 17 min  Past Medical History:  Past Medical History:  Diagnosis Date  . Acute delirium 11/18/2014  . Acute encephalopathy   . Acute on chronic respiratory failure with hypoxia ([place]) 09/09/2016  . Acute respiratory failure with hypoxia ([place]) 11/29/2015  . Anemia in chronic kidney disease 12/09/2014  . Anxiety   . Arthritis   . Benign hypertension   . Bipolar affective disorder ([place]) 12/09/2014  . CAD (coronary artery disease), native coronary artery with 2 stents  11/17/2014  . Charcot foot due to diabetes mellitus ([place])   . Chronic combined systolic and diastolic CHF (congestive heart failure) ([place]) 11/18/2014  . Closed left ankle fracture 11/17/2014  . Confusion 01/21/2015  . Depression   . Diabetes mellitus without complication ([place])   . End stage renal disease on dialysis ([place])   . Fracture dislocation of ankle 11/17/2014  . GERD (gastroesophageal reflux disease)   . Heart murmur   . History of blood transfusion   . History of bronchitis   . History of pneumonia   . Hyperlipidemia 03/26/2015  . Hypertension associated with diabetes ([place]) 11/18/2014  . Hypertensive heart/renal disease with failure ([place]) 12/09/2014  . Hypokalemia 11/17/2014  . Multiple falls 01/21/2015  . Obesity 11/17/2014  . Onychomycosis 10/07/2016  . Right leg DVT ([place]) 12/05/2015  . SIRS (systemic inflammatory response syndrome) ([place]) 08/08/2016  . Sleep apnea   . Unstable angina ([place]) 12/26/2017  . Ventricular tachycardia [facility])    Past Surgical History:  Past Surgical History:  Procedure Laterality Date  . A/V FISTULAGRAM N/A 01/03/2020   Procedure: A/V FISTULAGRAM;  Surgeon: Serafina Mitchell, MD;  Location: [place] [facility];  Service: Cardiovascular;  Laterality:  N/A;  . ABDOMINAL HYSTERECTOMY    . ANGIOPLASTY Left 01/26/2020   Procedure: LEFT ARM GRAPH THROMBECTOMY WITH BALLON ANGIOPLASTY;  Surgeon: Waynetta Sandy, MD;  Location: [place];  Service: Vascular;  Laterality: Left;  . ANKLE CLOSED REDUCTION N/A 11/17/2014   Procedure: CLOSED REDUCTION ANKLE;  Surgeon: Earlie Server, MD;  Location: [place];  Service: Orthopedics;  Laterality: N/A;  . ANKLE FUSION Left 03/16/2015   Procedure: Left Tibiocalcaneal Fusion;  Surgeon: Newt Minion, MD;  Location: [place];  Service: Orthopedics;  Laterality: Left;  . APPLICATION OF WOUND VAC Right 08/06/2016   Procedure: APPLICATION OF PREVENA WOUND VAC;  Surgeon: Newt Minion, MD;  Location: [place];  Service: Orthopedics;  Laterality: Right;  . AV FISTULA PLACEMENT Left [id number]   done at [place]     2 stent   . CHOLECYSTECTOMY    . CORONARY STENT PLACEMENT    . FISTULOGRAM Left 01/26/2020   Procedure: FISTULOGRAM;  Surgeon: Waynetta Sandy, MD;  Location: [place];  Service: Vascular;  Laterality: Left;  . FOOT ARTHRODESIS Right 08/06/2016   Procedure: Right Foot Fusion Lisfranc Joint;  Surgeon: Newt Minion, MD;  Location: [place];  Service: Orthopedics;  Laterality: Right;  . HARDWARE REMOVAL Left 03/16/2015   Procedure: Removal Hardware Left Ankle;  Surgeon: Newt Minion, MD;  Location: [place];  Service: Orthopedics;  Laterality: Left;  . IR AV DIALY SHUNT INTRO NEEDLE/INTRACATH INITIAL W/PTA/IMG LEFT  01/05/2018  . IR US GUIDE VASC ACCESS LEFT  01/05/2018  .  LEFT HEART CATH AND CORONARY ANGIOGRAPHY N/A 12/28/2017   Procedure: LEFT HEART CATH AND CORONARY ANGIOGRAPHY;  Surgeon: Burnell Blanks, MD;  Location: [place] [facility];  Service: Cardiovascular;  Laterality: N/A;  . ORIF ANKLE FRACTURE Left 11/20/2014   Procedure: OPEN REDUCTION INTERNAL FIXATION (ORIF) ANKLE FRACTURE;  Surgeon: Renette Butters, MD;  Location: [place];  Service: Orthopedics;  Laterality:  Left;  . PERIPHERAL VASCULAR BALLOON ANGIOPLASTY  01/03/2020   Procedure: PERIPHERAL VASCULAR BALLOON ANGIOPLASTY;  Surgeon: Serafina Mitchell, MD;  Location: [place] [facility];  Service: Cardiovascular;;  . TONSILLECTOMY     HPI:  This is a 70 year old female with a history of ESRD on HD MWF, DM, HTN, HLD, combined HF, bipolar, early dementia, COPD presenting for altered mental status and slurred speech.  Brain MRI was negative for acute changes.   Assessment / Plan / Recommendation Clinical Impression  Pt was seen for a bedside swallow evaluation in the setting of acute cognitive changes.  Pt was encountered awake/alert with family at bedside.  Pt was noted to have missing dentition during oral mechanism examination.  She stated that she has dentures at home,  but that she does not wear them while consuming food.  Pt was seen with trials of thin liquid, puree, and regular solids from her breakfast meal tray.  She exhibited suspected prolonged AP transport with puree and solids, and she exhibited prolonged mastication with solids.  Pt was also noted to be impulsive, taking multiple large bites of food in a row resulting in overstuffing her oral cavity.  Pt's family reported that this was baseline for the pt.  She exhibited a delayed cough x2 following a puree and solid trial.  Pt's family stated that she has had a baseline cough in the absence of PO for the past two weeks.  No additional clinical s/sx of aspiration were observed.  Therefore, recommend diet change to Dysphagia 3 (soft) solids and thin liquids with medications administered whole in puree (cut/crush large pills).  SLP will briefly f/u to monitor diet tolerance.  SLP Visit Diagnosis: Dysphagia, unspecified (R13.10)    Aspiration Risk  Mild aspiration risk    Diet Recommendation Dysphagia 3 (Mech soft);Thin liquid   Liquid Administration via: Cup;Straw Medication Administration: Whole meds with puree Supervision: Patient able to self  feed;Full supervision/cueing for compensatory strategies Compensations: Minimize environmental distractions;Slow rate;Small sips/bites (One bite at at time)    Other  Recommendations Oral Care Recommendations: Oral care BID   Follow up Recommendations Skilled Nursing facility      Frequency and Duration min 2x/week  2 weeks       Prognosis Prognosis for Safe Diet Advancement: Fair Barriers to Reach Goals: Cognitive deficits      Swallow Study   General HPI: This is a 70 year old female with a history of ESRD on HD MWF, DM, HTN, HLD, combined HF, bipolar, early dementia, COPD presenting for altered mental status and slurred speech.  Brain MRI was negative for acute changes. Type of Study: Bedside Swallow Evaluation Previous Swallow Assessment: None Diet Prior to this Study: Regular;Thin liquids Temperature Spikes Noted: Yes Respiratory Status: Nasal cannula History of Recent Intubation: No Behavior/Cognition: Alert;Cooperative Oral Cavity Assessment: Within Functional Limits Oral Care Completed by SLP: No Oral Cavity - Dentition: Dentures, not available;Missing dentition Vision: Functional for self-feeding Self-Feeding Abilities: Needs set up;Able to feed self Patient Positioning: Upright in bed Baseline Vocal Quality: Normal Volitional Cough: Strong Volitional Swallow: Able  to elicit    Oral/Motor/Sensory Function Overall Oral Motor/Sensory Function: Within functional limits   Ice Chips Ice chips: Not tested   Thin Liquid Thin Liquid: Within functional limits    Nectar Thick Nectar Thick Liquid: Not tested   Honey Thick Honey Thick Liquid: Not tested   Puree Puree: Impaired Presentation: Self Fed Oral Phase Functional Implications: Prolonged oral transit Pharyngeal Phase Impairments: Cough - Delayed   Solid     Solid: Impaired Presentation: Self Fed Oral Phase Impairments: Impaired mastication Oral Phase Functional Implications: Impaired mastication;Oral  residue;Prolonged oral transit Pharyngeal Phase Impairments: Cough - Delayed     Colin Mulders M.S., CCC-SLP Acute Rehabilitation Services Office: [phone]  Elvia Collum Robin [name] 07/28/2020,8:52 AM

## 2020-07-28 NOTE — Progress Notes (Addendum)
Both patient and RN tried calling daughter, no answer, will attempt again.  Daughter Robin [name] notified of discharge at 51, will be transporting patient home in an hour she said.  Called Daughter again at 40, said she will arrive in 30 min, patient discharge paperwork printed and patient ready for discharge

## 2020-07-28 NOTE — Progress Notes (Signed)
Subjective:  Had HD overnight-  Removed 1000 but more importantly seems to be back to baseline mentally -  Lab trying to draw samples-  Daughter at bedside-  Concerned about sugar but now diet is ordered   Objective Vital signs in last 24 hours: Vitals:   07/28/20 0609 07/28/20 0624 07/28/20 0639 07/28/20 0700  BP: 135/69 133/68 130/80 134/70  Pulse: (!) 102 82 93 97  Resp: 16 15 16    Temp:   98.8 F (37.1 C) 97.9 F (36.6 C)  TempSrc:   Oral   SpO2: (!) 87% 100% 100% 100%   Weight change:   Intake/Output Summary (Last 24 hours) at 07/28/2020 4967 Last data filed at 07/28/2020 5916 Gross per 24 hour  Intake -  Output 1000 ml  Net -1000 ml    Dialysis Orders: Center: [facility]  on MWF. EDW 68 kg HD Bath 2K, 2 CA time 3.5-hour Heparin none. Access left UA AV fistula     Hec 7 mcg IV/HD  No Mircera or iron  Assessment/Plan 1. AMS-work-up per admit, questionable codeine effect- seems better after HD-  Pretty much back to baseline according to daughter   2. ESRD -HD MWF, HD today on schedule via left upper arm AVF-  Will plan for next HD Monday if still here 3. Hypertension/volume  -attempt 2 L UF with dialysis, got one-congestion on chest x-ray continue Imdur, metoprolol for now  4. Anemia of ESRD-Hgb 10.9 no ESA at center, none needed here yet 5. Metabolic bone disease -calcium elevated - hold Hectorol, follow-up calcium phosphorus trend will resume auryxia  6. Nutrition -renal carb modified diet, albumin 3.0 give Nepro 7. History of PE on Eliquis 8. DM T2-per admit- some lows due to NPO yest 9. History of CAD-Imdur metoprolol aspirin 10. Dispo-  Possible for discharge soon since MS is improved ?  Per primary team      [place]: Basic Metabolic Panel: Recent Labs  Lab 07/25/20 1329 07/27/20 0948  NA 136 137  K 4.5 4.6  CL 92* 93*  CO2 26 26  GLUCOSE 184* 110*  BUN 32* 50*  CREATININE 7.42* 10.80*  CALCIUM 10.3 10.0   Liver Function  Tests: Recent Labs  Lab 07/27/20 0948  AST 17  ALT 11  ALKPHOS 84  BILITOT 1.0  PROT 7.1  ALBUMIN 3.0*   Recent Labs  Lab 07/27/20 0948  LIPASE 23   No results for input(s): AMMONIA in the last 168 hours. CBC: Recent Labs  Lab 07/25/20 1329 07/27/20 0937  WBC 9.3 14.6*  NEUTROABS 6.6  --   HGB 12.2 10.9*  HCT 41.4 36.4  MCV 100.7* 101.1*  PLT 221 224   Cardiac Enzymes: No results for input(s): CKTOTAL, CKMB, CKMBINDEX, TROPONINI in the last 168 hours. CBG: Recent Labs  Lab 07/27/20 2037 07/28/20 0028 07/28/20 0120 07/28/20 0704 07/28/20 0742  GLUCAP 163* 46* 148* 53* 132*    Iron Studies: No results for input(s): IRON, TIBC, TRANSFERRIN, FERRITIN in the last 72 hours. Studies/Results: CT Head Wo Contrast  Result Date: 07/27/2020 CLINICAL DATA:  Altered mental status EXAM: CT HEAD WITHOUT CONTRAST TECHNIQUE: Contiguous axial images were obtained from the base of the skull through the vertex without intravenous contrast. COMPARISON:  01/08/2020 FINDINGS: Brain: No evidence of acute infarction, hemorrhage, hydrocephalus, extra-axial collection or mass lesion/mass effect. Vascular: Density is noted throughout the vascular structures raising suspicion for recent contrast administration. Alternatively this may be to dehydration. Skull: Normal.  Negative for fracture or focal lesion. Sinuses/Orbits: No acute finding. Other: None. IMPRESSION: No acute intracranial abnormality is noted. Relative increased density in the vascular structures is noted which may be related to recent contrast administration or dehydration. Electronically Signed   By: Inez Catalina M.D.   On: 07/27/2020 10:59   MR BRAIN WO CONTRAST  Result Date: 07/27/2020 CLINICAL DATA:  Neuro deficit, acute stroke suspected. EXAM: MRI HEAD WITHOUT CONTRAST TECHNIQUE: Multiplanar, multiecho pulse sequences of the brain and surrounding structures were obtained without intravenous contrast. COMPARISON:  MRI head March 16, 2016.  CT head from the same day. FINDINGS: Brain: No acute infarction, hemorrhage, hydrocephalus, extra-axial collection or mass lesion. Slight progression of diffuse cerebral atrophy with ex vacuo ventricular dilation. Mild for age T2/FLAIR hyperintensities within the white matter, most likely related to chronic microvascular ischemic disease. Vascular: Major arterial flow voids are maintained at the skull base. Skull and upper cervical spine: Normal marrow signal. Sinuses/Orbits: Sinuses are largely clear.  Unremarkable orbits. Other: No mastoid effusions. IMPRESSION: 1. No acute intracranial abnormality.  No acute infarct. 2. Generalized cerebral atrophy. Electronically Signed   By: Margaretha Sheffield MD   On: 07/27/2020 18:29   DG Chest Port 1 View  Result Date: 07/27/2020 CLINICAL DATA:  Weakness EXAM: PORTABLE CHEST 1 VIEW COMPARISON:  01/10/2020 FINDINGS: Cardiac shadow is mildly enlarged but stable. Mild vascular congestion is seen but improved from the prior exam. Vascular stenting in the left arm is noted. No bony abnormality is seen. IMPRESSION: Mild vascular congestion improved from the prior exam. Electronically Signed   By: Inez Catalina M.D.   On: 07/27/2020 10:45   Medications: Infusions:   Scheduled Medications: . apixaban  2.5 mg Oral BID WC  . ARIPiprazole  12 mg Oral QHS  . atorvastatin  40 mg Oral QHS  . dextrose  12.5 g Intravenous Once  . gabapentin  100 mg Oral BID  . guaiFENesin  600 mg Oral BID  . insulin aspart  0-9 Units Subcutaneous TID WC  . nicotine  7 mg Transdermal Daily  . umeclidinium bromide  1 puff Inhalation Daily    have reviewed scheduled and prn medications.  Physical Exam: General:  More alert, talking and making jokes Heart: RRR Lungs: mostly clear Abdomen: soft, non tender Extremities: min if any edema Dialysis Access: left AVF-  Patent     07/28/2020,8:07 AM  LOS: 1 day

## 2020-07-28 NOTE — Plan of Care (Signed)

## 2020-07-28 NOTE — Progress Notes (Signed)
Went for dialysis.

## 2020-07-28 NOTE — Discharge Summary (Addendum)
Name: Robin [name] MRN: [id number] DOB: [birth year] 70 y.o. P[facility]: [facility]  Date of Admission: 07/27/2020  9:24 AM Date of Discharge: 07/28/20 Attending Physician: Dr. Philipp Ovens   Discharge Diagnosis: Active Problems:   AMS (altered mental status)   Discharge Medications: Allergies as of 07/28/2020   No Known Allergies     Medication List    TAKE these medications   acetaminophen 325 MG tablet Commonly known as: TYLENOL Take 975 mg by mouth daily as needed for headache (pain).   albuterol 108 (90 Base) MCG/ACT inhaler Commonly known as: VENTOLIN HFA Inhale 1-2 puffs into the lungs every 6 (six) hours as needed for wheezing or shortness of breath.   apixaban 5 MG Tabs tablet Commonly known as: ELIQUIS Take 2.5 mg by mouth 2 (two) times daily with a meal.   ARIPiprazole 2 MG tablet Commonly known as: ABILIFY Take 2 mg by mouth at bedtime. Take with 1/2 20 mg tablet for a total dose of 12 mg   ARIPiprazole 20 MG tablet Commonly known as: ABILIFY Take 10 mg by mouth at bedtime. Take with a 2 mg tablet for a total dose of 12 mg   atorvastatin 80 MG tablet Commonly known as: LIPITOR Take 40 mg by mouth at bedtime.   benzonatate 100 MG capsule Commonly known as: TESSALON Take by mouth 3 (three) times daily as needed for cough.   ferric citrate 1 GM 210 MG(Fe) tablet Commonly known as: AURYXIA Take 420 mg by mouth 2 (two) times daily with a meal.   gabapentin 100 MG capsule Commonly known as: NEURONTIN Take 100 mg by mouth 2 (two) times daily.   insulin aspart 100 UNIT/ML injection Commonly known as: novoLOG Inject 6 Units into the skin 3 (three) times daily as needed for high blood sugar (CBG >300).   isosorbide mononitrate 60 MG 24 hr tablet Commonly known as: IMDUR Take 60 mg by mouth daily with breakfast.   Levemir FlexTouch 100 UNIT/ML FlexPen Generic drug: insulin detemir Inject 35 Units into the skin daily with breakfast.   melatonin 5  MG Tabs Take 10 mg by mouth at bedtime.   metoprolol tartrate 50 MG tablet Commonly known as: LOPRESSOR Take 75 mg by mouth 2 (two) times daily with a meal. What changed: Another medication with the same name was removed. Continue taking this medication, and follow the directions you see here.   multivitamin Tabs tablet Take 1 tablet by mouth at bedtime.   nitroGLYCERIN 0.4 MG SL tablet Commonly known as: NITROSTAT Place 0.4 mg under the tongue every 5 (five) minutes as needed for chest pain.   pantoprazole 40 MG tablet Commonly known as: PROTONIX Take 40 mg by mouth 2 (two) times daily before a meal.   Spiriva Respimat 1.25 MCG/ACT Aers Generic drug: Tiotropium Bromide Monohydrate Inhale 2 puffs into the lungs every morning.   Victoza 18 MG/3ML Sopn Generic drug: liraglutide Inject 1.2 mg into the skin daily with breakfast.      Disposition and follow-up:   Robin [name] was [facility] from [facility] in Stable condition.  At the hospital follow up visit please address:  1.  Follow-up:  A.  Altered mental status resolved-believed to be secondary to codeine use in context of  ESRD    B.  ESRD-dialysis Monday Wednesday Friday.  Consider increased UF   C.  Cough-secondary to pulmonary edema versus tobacco use  2.  Labs / imaging needed at time of  follow-up: Labs per nephro  3.  Pending labs/ test needing follow-up: None  4.  Medication Changes  Started: None  Stopped: None  Abx -none End Date: None  Follow-up Appointments:  [place] Follow up in 1 week(s).   Specialty: General Practice Contact information: [place] [address] [phone]        Belva Crome, MD Follow up.   Specialty: Cardiology Contact information: [address] [address] [place] by problem list:   Altered mental status Patient  presented to hospital with worsening altered mental status for the past few days.  Daughter endorses history of similar events occurring in the past when patient was on narcotics in context of her ESRD.  Daughter noticed patient's oversleeping which is out of the norm for the patient.  Of note daughter noticed increased lethargy/sedation since receiving codeine from primary care provider for cough.  On initial presentation patient found to be lethargic with some response to verbal stimuli.  Infectious etiology was worked up patient found to have leukocytosis.  No lactic acidosis.  Chest x-ray negative for pneumonia.  Patient's daughter did endorse some right-sided facial droop and right-sided weakness that was transient, negative CT head without contrast as well as negative MRI brain without contrast.  With history of COPD as well as sleep apnea, hypercapnia was also considered.  Patient with much improvement after sleeping and HD.  She is back at baseline at this time of discharge.  Instructed patient and daughter to no longer use cough medicine with narcotics.  Discussed with her safe narcotics to use in ESRD such as fentanyl and Dilaudid. Consider discontinuing gabapentin in setting of ESRD.  Cough Patient with cough for the past 2 weeks.  Had work-up at [facility], unable to review these records.  During this hospitalization chest x-ray was negative for pneumonia however patient was thought to have some pulmonary edema.  This may be contributing to her cough.  Also suspect that tobacco use in setting of COPD may be contributing as well.  Patient will need further work-up on outpatient basis, differential includes GERD, COPD.  Recommended patient continue albuterol.  ESRD Patient had dialysis session today, day of discharge.  Skipped session yesterday due to altered mental status and being in the emergency department.  We will continue with usual schedule of Monday Wednesday Friday dialysis.  Patient seen by  Dr. Moshe Cipro during her admission.  Agree with plan to discharge patient home with follow-up Monday for dialysis.  Patient with pulmonary edema on physical exam on day of discharge, would recommend considering increased UF at next outpatient dialysis session.  Patient notes dry weight of 65 kg.  Discharge Subjective: Patient back to baseline this morning per her and her daughter.  Patient asking when she can go home.  Patient denies new shortness of breath chest pain.  Patient and daughter state dry weight is 65 kg.  Patient does have inhaler of Spiriva at home, was told to not use albuterol.  Plan for discharge home.  Discharge Exam:   BP 131/77 (BP Location: Right Arm)   Pulse 98   Temp 97.9 F (36.6 C) (Oral)   Resp 18   SpO2 100%  Constitutional: well-appearing, resting in bed comfortably HENT: normocephalic atraumatic Eyes: conjunctiva non-erythematous Neck: supple Cardiovascular: regular rate and rhythm, no m/r/g Pulmonary/Chest:  normal work of breathing on room air.  Left lower lobe rhonchi.  No wheezing. Abdominal: soft, non-tender, non-distended MSK: normal bulk and tone Neurological: alert & oriented x 3 Skin: warm and dry Psych: Normal mood and thought process   Pertinent Labs, Studies, and Procedures:  CBC Latest Ref Rng & Units 07/28/2020 07/27/2020 07/25/2020  WBC 4.0 - 10.5 K/uL 8.2 14.6(H) 9.3  Hemoglobin 12.0 - 15.0 g/dL 10.2(L) 10.9(L) 12.2  Hematocrit 36.0 - 46.0 % 33.4(L) 36.4 41.4  Platelets 150 - 400 K/uL 202 224 221    CMP Latest Ref Rng & Units 07/28/2020 07/27/2020 07/25/2020  Glucose 70 - 99 mg/dL 113(H) 110(H) 184(H)  BUN 8 - 23 mg/dL 9 50(H) 32(H)  Creatinine 0.44 - 1.00 mg/dL 3.78(H) 10.80(H) 7.42(H)  Sodium 135 - 145 mmol/L 135 137 136  Potassium 3.5 - 5.1 mmol/L 3.0(L) 4.6 4.5  Chloride 98 - 111 mmol/L 94(L) 93(L) 92(L)  CO2 22 - 32 mmol/L 30 26 26   Calcium 8.9 - 10.3 mg/dL 8.6(L) 10.0 10.3  Total Protein 6.5 - 8.1 g/dL - 7.1 -  Total Bilirubin 0.3 -  1.2 mg/dL - 1.0 -  Alkaline Phos 38 - 126 U/L - 84 -  AST 15 - 41 U/L - 17 -  ALT 0 - 44 U/L - 11 -    CT Head Wo Contrast  Result Date: 07/27/2020 CLINICAL DATA:  Altered mental status EXAM: CT HEAD WITHOUT CONTRAST TECHNIQUE: Contiguous axial images were obtained from the base of the skull through the vertex without intravenous contrast. COMPARISON:  01/08/2020 FINDINGS: Brain: No evidence of acute infarction, hemorrhage, hydrocephalus, extra-axial collection or mass lesion/mass effect. Vascular: Density is noted throughout the vascular structures raising suspicion for recent contrast administration. Alternatively this may be to dehydration. Skull: Normal. Negative for fracture or focal lesion. Sinuses/Orbits: No acute finding. Other: None. IMPRESSION: No acute intracranial abnormality is noted. Relative increased density in the vascular structures is noted which may be related to recent contrast administration or dehydration. Electronically Signed   By: Inez Catalina M.D.   On: 07/27/2020 10:59   MR BRAIN WO CONTRAST  Result Date: 07/27/2020 CLINICAL DATA:  Neuro deficit, acute stroke suspected. EXAM: MRI HEAD WITHOUT CONTRAST TECHNIQUE: Multiplanar, multiecho pulse sequences of the brain and surrounding structures were obtained without intravenous contrast. COMPARISON:  MRI head March 16, 2016.  CT head from the same day. FINDINGS: Brain: No acute infarction, hemorrhage, hydrocephalus, extra-axial collection or mass lesion. Slight progression of diffuse cerebral atrophy with ex vacuo ventricular dilation. Mild for age T2/FLAIR hyperintensities within the white matter, most likely related to chronic microvascular ischemic disease. Vascular: Major arterial flow voids are maintained at the skull base. Skull and upper cervical spine: Normal marrow signal. Sinuses/Orbits: Sinuses are largely clear.  Unremarkable orbits. Other: No mastoid effusions. IMPRESSION: 1. No acute intracranial abnormality.  No  acute infarct. 2. Generalized cerebral atrophy. Electronically Signed   By: Margaretha Sheffield MD   On: 07/27/2020 18:29   DG Chest Port 1 View  Result Date: 07/27/2020 CLINICAL DATA:  Weakness EXAM: PORTABLE CHEST 1 VIEW COMPARISON:  01/10/2020 FINDINGS: Cardiac shadow is mildly enlarged but stable. Mild vascular congestion is seen but improved from the prior exam. Vascular stenting in the left arm is noted. No bony abnormality is seen. IMPRESSION: Mild vascular congestion improved from the prior exam. Electronically Signed   By: Inez Catalina M.D.   On: 07/27/2020 10:45     Discharge Instructions    Diet -  low sodium heart healthy   Complete by: As directed    Discharge instructions   Complete by: As directed    Ms. Back thank you for letting us take care of you during your admission.  You will be continued on all of your home medications please refrain from taking the codeine.  Please continue to follow in your dialysis schedule.  If you notice any new symptoms or believe you are having a medical emergency, please go to your closest emergency department.  Otherwise please follow-up with your primary care doctor within a week.   Increase activity slowly   Complete by: As directed       Signed: Riesa Pope, MD 07/28/2020, 1:16 PM   Pager: [phone]

## 2020-07-29 ENCOUNTER — Telehealth: Payer: Self-pay | Admitting: Nephrology

## 2020-07-29 NOTE — Telephone Encounter (Signed)
Transition of care contact from inpatient facility  Date of discharge:07/28/20  Date of contact: 07/29/20 Method: Phone Spoke to: Patient  Patient contacted to discuss transition of care from recent inpatient hospitalization. Patient was admitted to [facility] from .07/27/20 to 07/28/20.Marland Kitchen with discharge diagnosis of  AMS 2/2 codiene cough med   Medication changes were reviewed.  Patient will follow up with his/her outpatient HD unit on: Monday 07/30/20 at [facility]

## 2020-07-30 ENCOUNTER — Ambulatory Visit (INDEPENDENT_AMBULATORY_CARE_PROVIDER_SITE_OTHER): Payer: No Typology Code available for payment source | Admitting: Orthopedic Surgery

## 2020-07-30 ENCOUNTER — Encounter: Payer: Self-pay | Admitting: Orthopedic Surgery

## 2020-07-30 VITALS — Ht 64.0 in | Wt 154.0 lb

## 2020-07-30 DIAGNOSIS — E11610 Type 2 diabetes mellitus with diabetic neuropathic arthropathy: Secondary | ICD-10-CM

## 2020-07-30 DIAGNOSIS — B351 Tinea unguium: Secondary | ICD-10-CM

## 2020-07-30 NOTE — Progress Notes (Signed)
Office Visit Note   Patient: Robin [name]           Date of Birth: January 17, 1951           MRN: [id number] Visit Date: 07/30/2020              Requested by: Center, [facility] [address] [place],  [place] [address] PCP: [facility], [facility]  Chief Complaint  Patient presents with  . Right Foot - Pain  . Left Foot - Pain      HPI: Patient is a 70 year old woman who presents in follow-up for both feet.  Patient complains of pain beneath the toes worse on the right than the left foot.  She complains of painful discolored onychomycotic nails x10.  Assessment & Plan: Visit Diagnoses:  1. Onychomycosis   2. Charcot foot due to diabetes mellitus ([place])     Plan: Nails were trimmed T02 without complications.  Reevaluate in 3 months call if she develops any ulcers.  Follow-Up Instructions: Return in about 3 months (around 10/30/2020).   Ortho Exam  Patient is alert, oriented, no adenopathy, well-dressed, normal affect, normal respiratory effort.  Examination patient has some hypertrophic calluses on her toes on the right foot there are no plantar ulcers on either foot there is no swelling no cellulitis no redness no signs of any active Charcot process.  She has thickened discolored onychomycotic nails x10 that she is unable to trim on her own.  Nails were trimmed I09 without complications and the calluses were pared beneath the toes of the right foot.  There is no open wounds no cellulitis no drainage no swelling.  Imaging: No results found. No images are attached to the encounter.  Labs: Lab Results  Component Value Date   HGBA1C 8.7 (H) 07/27/2020   HGBA1C 8.1 (H) 01/10/2020   HGBA1C 7.9 (H) 10/01/2019   ESRSEDRATE 17 11/23/2019   CRP 0.9 01/15/2020   CRP 1.1 (H) 01/14/2020   CRP 2.0 (H) 01/13/2020   LABURIC 4.3 11/22/2019   LABURIC 5.4 12/01/2009   LABURIC 5.5 11/30/2009   REPTSTATUS PENDING 07/27/2020   GRAMSTAIN  03/14/2016    MODERATE WBC PRESENT,BOTH PMN AND  MONONUCLEAR NO ORGANISMS SEEN    CULT  07/27/2020    NO GROWTH 3 DAYS Performed at [facility], [place] [address], [place], [place] [address]      Lab Results  Component Value Date   ALBUMIN 3.0 (L) 07/27/2020   ALBUMIN 2.6 (L) 01/31/2020   ALBUMIN 2.2 (L) 01/28/2020   LABURIC 4.3 11/22/2019   LABURIC 5.4 12/01/2009   LABURIC 5.5 11/30/2009    Lab Results  Component Value Date   MG 2.6 (H) 07/27/2020   MG 1.8 01/15/2020   MG 2.1 01/14/2020   No results found for: VD25OH  No results found for: PREALBUMIN CBC EXTENDED Latest Ref Rng & Units 07/28/2020 07/27/2020 07/25/2020  WBC 4.0 - 10.5 K/uL 8.2 14.6(H) 9.3  RBC 3.87 - 5.11 MIL/uL 3.37(L) 3.60(L) 4.11  HGB 12.0 - 15.0 g/dL 10.2(L) 10.9(L) 12.2  HCT 36.0 - 46.0 % 33.4(L) 36.4 41.4  PLT 150 - 400 K/uL 202 224 221  NEUTROABS 1.7 - 7.7 K/uL - - 6.6  LYMPHSABS 0.7 - 4.0 K/uL - - 1.3     Body mass index is 26.43 kg/m.  Orders:  No orders of the defined types were placed in this encounter.  No orders of the defined types were placed in this encounter.  Procedures: No procedures performed  Clinical Data: No additional findings.  ROS:  All other systems negative, except as noted in the HPI. Review of Systems  Objective: Vital Signs: Ht 5\' 4"  (1.626 m)   Wt 154 lb (69.9 kg)   BMI 26.43 kg/m   Specialty Comments:  No specialty comments available.  PMFS History: Patient Active Problem List   Diagnosis Date Noted  . AMS (altered mental status) 07/27/2020  . COVID-19 01/10/2020  . Hypoxia 01/10/2020  . Chest pain 10/01/2019  . History of pulmonary embolism 10/01/2019  . Prolonged QT interval 10/01/2019  . Hypertensive urgency   . Other headache syndrome   . Orthostatic dizziness 04/15/2019  . Bradycardia 04/15/2019  . Syncope   . ESRD (end stage renal disease) ([place])   . Chest pain at rest 09/16/2018  . Diabetic Charct's arthropathy ([place]) 10/07/2016  . GERD (gastroesophageal reflux disease)  09/09/2016  . Depression 09/09/2016  . Chronic diastolic (congestive) heart failure ([place]) 09/09/2016  . Elevated troponin 09/09/2016  . Hyperlipidemia associated with type 2 diabetes mellitus ([place]) 03/26/2015  . Charcot ankle 03/16/2015  . Anemia, chronic renal failure   . NSVT (nonsustained ventricular tachycardia) ([place])   . Hypertension due to end stage renal disease caused by type 2 diabetes mellitus, on dialysis ([place]) 12/09/2014  . Bipolar affective disorder ([place]) 12/09/2014  . Diabetes mellitus type 2, insulin dependent ([place]) 11/17/2014  . CAD (coronary artery disease), native coronary artery with 2 stents  11/17/2014   Past Medical History:  Diagnosis Date  . Acute delirium 11/18/2014  . Acute encephalopathy   . Acute on chronic respiratory failure with hypoxia ([place]) 09/09/2016  . Acute respiratory failure with hypoxia ([place]) 11/29/2015  . Anemia in chronic kidney disease 12/09/2014  . Anxiety   . Arthritis   . Benign hypertension   . Bipolar affective disorder ([place]) 12/09/2014  . CAD (coronary artery disease), native coronary artery with 2 stents  11/17/2014  . Charcot foot due to diabetes mellitus ([place])   . Chronic combined systolic and diastolic CHF (congestive heart failure) ([place]) 11/18/2014  . Closed left ankle fracture 11/17/2014  . Confusion 01/21/2015  . Depression   . Diabetes mellitus without complication ([place])   . End stage renal disease on dialysis ([place])   . Fracture dislocation of ankle 11/17/2014  . GERD (gastroesophageal reflux disease)   . Heart murmur   . History of blood transfusion   . History of bronchitis   . History of pneumonia   . Hyperlipidemia 03/26/2015  . Hypertension associated with diabetes ([place]) 11/18/2014  . Hypertensive heart/renal disease with failure ([place]) 12/09/2014  . Hypokalemia 11/17/2014  . Multiple falls 01/21/2015  . Obesity 11/17/2014  . Onychomycosis 10/07/2016  . Right leg DVT ([place]) 12/05/2015  . SIRS (systemic inflammatory response  syndrome) ([place]) 08/08/2016  . Sleep apnea   . Unstable angina ([place]) 12/26/2017  . Ventricular tachycardia ([place])     Family History  Family history unknown: Yes    Past Surgical History:  Procedure Laterality Date  . A/V FISTULAGRAM N/A 01/03/2020   Procedure: A/V FISTULAGRAM;  Surgeon: Serafina Mitchell, MD;  Location: [facility];  Service: Cardiovascular;  Laterality: N/A;  . ABDOMINAL HYSTERECTOMY    . ANGIOPLASTY Left 01/26/2020   Procedure: LEFT ARM GRAPH THROMBECTOMY WITH BALLON ANGIOPLASTY;  Surgeon: Waynetta Sandy, MD;  Location: [facility];  Service: Vascular;  Laterality: Left;  . ANKLE CLOSED REDUCTION N/A 11/17/2014   Procedure: CLOSED REDUCTION ANKLE;  Surgeon: Earlie Server, MD;  Location: [facility];  Service: Orthopedics;  Laterality: N/A;  . ANKLE FUSION Left 03/16/2015   Procedure: Left Tibiocalcaneal Fusion;  Surgeon: Newt Minion, MD;  Location: [facility];  Service: Orthopedics;  Laterality: Left;  . APPLICATION OF WOUND VAC Right 08/06/2016   Procedure: APPLICATION OF PREVENA WOUND VAC;  Surgeon: Newt Minion, MD;  Location: [facility];  Service: Orthopedics;  Laterality: Right;  . AV FISTULA PLACEMENT Left [id number]   done at [facility]     2 stent   . CHOLECYSTECTOMY    . CORONARY STENT PLACEMENT    . FISTULOGRAM Left 01/26/2020   Procedure: FISTULOGRAM;  Surgeon: Waynetta Sandy, MD;  Location: [facility];  Service: Vascular;  Laterality: Left;  . FOOT ARTHRODESIS Right 08/06/2016   Procedure: Right Foot Fusion Lisfranc Joint;  Surgeon: Newt Minion, MD;  Location: [facility];  Service: Orthopedics;  Laterality: Right;  . HARDWARE REMOVAL Left 03/16/2015   Procedure: Removal Hardware Left Ankle;  Surgeon: Newt Minion, MD;  Location: [facility];  Service: Orthopedics;  Laterality: Left;  . IR AV DIALY SHUNT INTRO NEEDLE/INTRACATH INITIAL W/PTA/IMG LEFT  01/05/2018  . IR US GUIDE VASC ACCESS LEFT  01/05/2018  . LEFT HEART CATH AND CORONARY  ANGIOGRAPHY N/A 12/28/2017   Procedure: LEFT HEART CATH AND CORONARY ANGIOGRAPHY;  Surgeon: Burnell Blanks, MD;  Location: [facility];  Service: Cardiovascular;  Laterality: N/A;  . ORIF ANKLE FRACTURE Left 11/20/2014   Procedure: OPEN REDUCTION INTERNAL FIXATION (ORIF) ANKLE FRACTURE;  Surgeon: Renette Butters, MD;  Location: [facility];  Service: Orthopedics;  Laterality: Left;  . PERIPHERAL VASCULAR BALLOON ANGIOPLASTY  01/03/2020   Procedure: PERIPHERAL VASCULAR BALLOON ANGIOPLASTY;  Surgeon: Serafina Mitchell, MD;  Location: [facility];  Service: Cardiovascular;;  . TONSILLECTOMY     Social History   Occupational History  . Not on file  Tobacco Use  . Smoking status: Current Every Day Smoker    Packs/day: 1.00    Types: Cigarettes  . Smokeless tobacco: Never Used  Substance and Sexual Activity  . Alcohol use: No  . Drug use: No  . Sexual activity: Never

## 2020-08-01 LAB — CULTURE, BLOOD (ROUTINE X 2)
Culture: NO GROWTH
Special Requests: ADEQUATE

## 2020-09-12 DIAGNOSIS — M19011 Primary osteoarthritis, right shoulder: Secondary | ICD-10-CM | POA: Diagnosis not present

## 2020-10-29 ENCOUNTER — Ambulatory Visit: Payer: No Typology Code available for payment source | Admitting: Orthopedic Surgery

## 2020-11-02 ENCOUNTER — Other Ambulatory Visit: Payer: Self-pay

## 2020-11-02 ENCOUNTER — Emergency Department (HOSPITAL_COMMUNITY)
Admission: EM | Admit: 2020-11-02 | Discharge: 2020-11-02 | Disposition: A | Discharge status: Home / Self Care | Payer: No Typology Code available for payment source | Attending: Emergency Medicine | Admitting: Emergency Medicine

## 2020-11-02 ENCOUNTER — Encounter (HOSPITAL_COMMUNITY): Payer: Self-pay | Admitting: Emergency Medicine

## 2020-11-02 DIAGNOSIS — Z951 Presence of aortocoronary bypass graft: Secondary | ICD-10-CM | POA: Insufficient documentation

## 2020-11-02 DIAGNOSIS — E1122 Type 2 diabetes mellitus with diabetic chronic kidney disease: Secondary | ICD-10-CM | POA: Insufficient documentation

## 2020-11-02 DIAGNOSIS — I5042 Chronic combined systolic (congestive) and diastolic (congestive) heart failure: Secondary | ICD-10-CM | POA: Insufficient documentation

## 2020-11-02 DIAGNOSIS — Z7901 Long term (current) use of anticoagulants: Secondary | ICD-10-CM | POA: Insufficient documentation

## 2020-11-02 DIAGNOSIS — R0902 Hypoxemia: Secondary | ICD-10-CM | POA: Diagnosis not present

## 2020-11-02 DIAGNOSIS — Z8616 Personal history of COVID-19: Secondary | ICD-10-CM | POA: Diagnosis not present

## 2020-11-02 DIAGNOSIS — F17210 Nicotine dependence, cigarettes, uncomplicated: Secondary | ICD-10-CM | POA: Diagnosis not present

## 2020-11-02 DIAGNOSIS — Z992 Dependence on renal dialysis: Secondary | ICD-10-CM | POA: Diagnosis not present

## 2020-11-02 DIAGNOSIS — Z794 Long term (current) use of insulin: Secondary | ICD-10-CM | POA: Insufficient documentation

## 2020-11-02 DIAGNOSIS — I2510 Atherosclerotic heart disease of native coronary artery without angina pectoris: Secondary | ICD-10-CM | POA: Insufficient documentation

## 2020-11-02 DIAGNOSIS — N186 End stage renal disease: Secondary | ICD-10-CM | POA: Insufficient documentation

## 2020-11-02 DIAGNOSIS — R58 Hemorrhage, not elsewhere classified: Secondary | ICD-10-CM | POA: Diagnosis not present

## 2020-11-02 DIAGNOSIS — T82838A Hemorrhage of vascular prosthetic devices, implants and grafts, initial encounter: Secondary | ICD-10-CM | POA: Diagnosis not present

## 2020-11-02 DIAGNOSIS — I132 Hypertensive heart and chronic kidney disease with heart failure and with stage 5 chronic kidney disease, or end stage renal disease: Secondary | ICD-10-CM | POA: Diagnosis not present

## 2020-11-02 DIAGNOSIS — Z79899 Other long term (current) drug therapy: Secondary | ICD-10-CM | POA: Insufficient documentation

## 2020-11-02 NOTE — ED Triage Notes (Signed)
Pt arrives via EMS from HD . Completed all tx, and unable to stop bleeding without clamp X1 hour. Alert and oriented X4. Per EMS daughter was upset with staff.   Fissula access on left. Clamp remains placed X1.5 hours.   136/90 93% RA P 72

## 2020-11-02 NOTE — ED Provider Notes (Signed)
[facility] EMERGENCY DEPARTMENT Provider Note   CSN: [id number] Arrival date & time: 11/02/20  1116     History Chief Complaint  Patient presents with   Bleeding Fistula    Robin [name] is a 70 y.o. female.  Patient is a long-term dialysis patient.  Dialyzed Monday Wednesdays and Fridays.  Patient completed dialysis today.  And they had trouble with bleeding from her left arm AV fistula.  They placed a clamp and dressing and EMS was called to bring her in.  Patient arrived here at about 15.  Patient is also on Eliquis.  Otherwise no acute findings.      Past Medical History:  Diagnosis Date   Acute delirium 11/18/2014   Acute encephalopathy    Acute on chronic respiratory failure with hypoxia (HCC) 09/09/2016   Acute respiratory failure with hypoxia (HCC) 11/29/2015   Anemia in chronic kidney disease 12/09/2014   Anxiety    Arthritis    Benign hypertension    Bipolar affective disorder ([place]) 12/09/2014   CAD (coronary artery disease), native coronary artery with 2 stents  11/17/2014   Charcot foot due to diabetes mellitus ([place])    Chronic combined systolic and diastolic CHF (congestive heart failure) ([place]) 11/18/2014   Closed left ankle fracture 11/17/2014   Confusion 01/21/2015   Depression    Diabetes mellitus without complication ([place])    End stage renal disease on dialysis ([place])    Fracture dislocation of ankle 11/17/2014   GERD (gastroesophageal reflux disease)    Heart murmur    History of blood transfusion    History of bronchitis    History of pneumonia    Hyperlipidemia 03/26/2015   Hypertension associated with diabetes ([place]) 11/18/2014   Hypertensive heart/renal disease with failure ([place]) 12/09/2014   Hypokalemia 11/17/2014   Multiple falls 01/21/2015   Obesity 11/17/2014   Onychomycosis 10/07/2016   Right leg DVT ([place]) 12/05/2015   SIRS (systemic inflammatory response syndrome) ([place]) 08/08/2016   Sleep apnea    Unstable angina ([place]) 12/26/2017    Ventricular tachycardia ([place])     Patient Active Problem List   Diagnosis Date Noted   AMS (altered mental status) 07/27/2020   COVID-19 01/10/2020   Hypoxia 01/10/2020   Chest pain 10/01/2019   History of pulmonary embolism 10/01/2019   Prolonged QT interval 10/01/2019   Hypertensive urgency    Other headache syndrome    Orthostatic dizziness 04/15/2019   Bradycardia 04/15/2019   Syncope    ESRD (end stage renal disease) ([place])    Chest pain at rest 09/16/2018   Diabetic Charct's arthropathy ([place]) 10/07/2016   GERD (gastroesophageal reflux disease) 09/09/2016   Depression 09/09/2016   Chronic diastolic (congestive) heart failure ([place]) 09/09/2016   Elevated troponin 09/09/2016   Hyperlipidemia associated with type 2 diabetes mellitus ([place]) 03/26/2015   Charcot ankle 03/16/2015   Anemia, chronic renal failure    NSVT (nonsustained ventricular tachycardia) ([place])    Hypertension due to end stage renal disease caused by type 2 diabetes mellitus, on dialysis ([place]) 12/09/2014   Bipolar affective disorder ([place]) 12/09/2014   Diabetes mellitus type 2, insulin dependent ([place]) 11/17/2014   CAD (coronary artery disease), native coronary artery with 2 stents  11/17/2014    Past Surgical History:  Procedure Laterality Date   A/V FISTULAGRAM N/A 01/03/2020   Procedure: A/V FISTULAGRAM;  Surgeon: Serafina Mitchell, MD;  Location: [facility];  Service: Cardiovascular;  Laterality: N/A;   ABDOMINAL HYSTERECTOMY  ANGIOPLASTY Left 01/26/2020   Procedure: LEFT ARM GRAPH THROMBECTOMY WITH BALLON ANGIOPLASTY;  Surgeon: Waynetta Sandy, MD;  Location: [facility];  Service: Vascular;  Laterality: Left;   ANKLE CLOSED REDUCTION N/A 11/17/2014   Procedure: CLOSED REDUCTION ANKLE;  Surgeon: Earlie Server, MD;  Location: [facility];  Service: Orthopedics;  Laterality: N/A;   ANKLE FUSION Left 03/16/2015   Procedure: Left Tibiocalcaneal Fusion;  Surgeon: Newt Minion, MD;  Location: [facility];   Service: Orthopedics;  Laterality: Left;   APPLICATION OF WOUND VAC Right 08/06/2016   Procedure: APPLICATION OF PREVENA WOUND VAC;  Surgeon: Newt Minion, MD;  Location: [facility];  Service: Orthopedics;  Laterality: Right;   AV FISTULA PLACEMENT Left [id number]   done at [facility]     2 stent    CHOLECYSTECTOMY     CORONARY STENT PLACEMENT     FISTULOGRAM Left 01/26/2020   Procedure: FISTULOGRAM;  Surgeon: Waynetta Sandy, MD;  Location: [facility];  Service: Vascular;  Laterality: Left;   FOOT ARTHRODESIS Right 08/06/2016   Procedure: Right Foot Fusion Lisfranc Joint;  Surgeon: Newt Minion, MD;  Location: [facility];  Service: Orthopedics;  Laterality: Right;   HARDWARE REMOVAL Left 03/16/2015   Procedure: Removal Hardware Left Ankle;  Surgeon: Newt Minion, MD;  Location: [place];  Service: Orthopedics;  Laterality: Left;   IR AV DIALY SHUNT INTRO NEEDLE/INTRACATH INITIAL W/PTA/IMG LEFT  01/05/2018   IR US GUIDE VASC ACCESS LEFT  01/05/2018   LEFT HEART CATH AND CORONARY ANGIOGRAPHY N/A 12/28/2017   Procedure: LEFT HEART CATH AND CORONARY ANGIOGRAPHY;  Surgeon: Burnell Blanks, MD;  Location: [facility];  Service: Cardiovascular;  Laterality: N/A;   ORIF ANKLE FRACTURE Left 11/20/2014   Procedure: OPEN REDUCTION INTERNAL FIXATION (ORIF) ANKLE FRACTURE;  Surgeon: Renette Butters, MD;  Location: [facility];  Service: Orthopedics;  Laterality: Left;   PERIPHERAL VASCULAR BALLOON ANGIOPLASTY  01/03/2020   Procedure: PERIPHERAL VASCULAR BALLOON ANGIOPLASTY;  Surgeon: Serafina Mitchell, MD;  Location: [facility];  Service: Cardiovascular;;   TONSILLECTOMY       OB History   No obstetric history on file.     Family History  Family history unknown: Yes    Social History   Tobacco Use   Smoking status: Every Day    Packs/day: 1.00    Pack years: 0.00    Types: Cigarettes   Smokeless tobacco: Never  Substance Use Topics   Alcohol use: No   Drug  use: No    Home Medications Prior to Admission medications   Medication Sig Start Date End Date Taking? Authorizing Provider  acetaminophen (TYLENOL) 325 MG tablet Take 975 mg by mouth daily as needed for headache (pain).    [provider]  albuterol (VENTOLIN HFA) 108 (90 Base) MCG/ACT inhaler Inhale 1-2 puffs into the lungs every 6 (six) hours as needed for wheezing or shortness of breath.    [provider]  apixaban (ELIQUIS) 5 MG TABS tablet Take 2.5 mg by mouth 2 (two) times daily with a meal.    [provider]  ARIPiprazole (ABILIFY) 2 MG tablet Take 2 mg by mouth at bedtime. Take with 1/2 20 mg tablet for a total dose of 12 mg    [provider]  ARIPiprazole (ABILIFY) 20 MG tablet Take 10 mg by mouth at bedtime. Take with a 2 mg tablet for a total dose of 12 mg  [provider]  atorvastatin (LIPITOR) 80 MG tablet Take 40 mg by mouth at bedtime.    [provider]  benzonatate (TESSALON) 100 MG capsule Take by mouth 3 (three) times daily as needed for cough.    [provider]  ferric citrate (AURYXIA) 1 GM 210 MG(Fe) tablet Take 420 mg by mouth 2 (two) times daily with a meal.    [provider]  gabapentin (NEURONTIN) 100 MG capsule Take 100 mg by mouth 2 (two) times daily.    [provider]  insulin aspart (NOVOLOG) 100 UNIT/ML injection Inject 6 Units into the skin 3 (three) times daily as needed for high blood sugar (CBG >300).    [provider]  insulin detemir (LEVEMIR FLEXTOUCH) 100 UNIT/ML FlexPen Inject 35 Units into the skin daily with breakfast.    [provider]  isosorbide mononitrate (IMDUR) 60 MG 24 hr tablet Take 60 mg by mouth daily with breakfast.    [provider]  liraglutide (VICTOZA) 18 MG/3ML SOPN Inject 1.2 mg into the skin daily with breakfast.    [provider]  Melatonin 5 MG TABS Take 10 mg by mouth at bedtime.    [provider]  metoprolol tartrate (LOPRESSOR) 50 MG tablet Take 75 mg by mouth 2 (two) times daily with a meal.    [provider]  multivitamin (RENA-VIT) TABS tablet Take 1 tablet by mouth at bedtime.  12/11/17   [provider]  nitroGLYCERIN (NITROSTAT) 0.4 MG SL tablet Place 0.4 mg under the tongue every 5 (five) minutes as needed for chest pain.    [provider]  pantoprazole (PROTONIX) 40 MG tablet Take 40 mg by mouth 2 (two) times daily before a meal.    [provider]  Tiotropium Bromide Monohydrate (SPIRIVA RESPIMAT) 1.25 MCG/ACT AERS Inhale 2 puffs into the lungs every morning.    [provider]    Allergies    Patient has no known allergies.  Review of Systems   Review of Systems  Constitutional:  Negative for chills and fever.  HENT:  Negative for ear pain and sore throat.   Eyes:  Negative for pain and visual disturbance.  Respiratory:  Negative for cough and shortness of breath.   Cardiovascular:  Negative for chest pain and palpitations.  Gastrointestinal:  Negative for abdominal pain and vomiting.  Genitourinary:  Negative for dysuria and hematuria.  Musculoskeletal:  Negative for arthralgias and back pain.  Skin:  Negative for color change and rash.  Neurological:  Negative for seizures and syncope.  Hematological:  Bruises/bleeds easily.  All other systems reviewed and are negative.  Physical Exam Updated Vital Signs BP (!) 159/84 (BP Location: Right Arm)   Pulse 80   Temp 98.4 F (36.9 C) (Oral)   Resp 16   Ht 1.626 m (5\' 4" )   Wt 74.8 kg   SpO2 98%   BMI 28.32 kg/m   Physical Exam Vitals and nursing note reviewed.  Constitutional:      General: She is not in acute distress.    Appearance: Normal appearance. She is well-developed.  HENT:     Head: Normocephalic and atraumatic.  Eyes:     Conjunctiva/sclera: Conjunctivae normal.  Cardiovascular:     Rate and Rhythm: Normal rate and regular rhythm.     Heart  sounds: No murmur heard. Pulmonary:     Effort: Pulmonary effort is normal. No respiratory distress.     Breath sounds: Normal breath  sounds.  Abdominal:     Palpations: Abdomen is soft.     Tenderness: There is no abdominal tenderness.  Musculoskeletal:     Cervical back: Neck supple.     Comments: Clamp to the left upper arm where the AV fistula is.  No bleeding through the dressing.  Distally radial pulses 2+.  No numbness or weakness to the fingers.  No swelling in the arm itself or forearm  Skin:    General: Skin is warm and dry.  Neurological:     General: No focal deficit present.     Mental Status: She is alert. Mental status is at baseline.     Comments: Some slight memory deficit    ED Results / Procedures / Treatments   Labs (all labs ordered are listed, but only abnormal results are displayed) Labs Reviewed - No data to display  EKG None  Radiology No results found.  Procedures Procedures   Medications Ordered in ED Medications - No data to display  ED Course  I have reviewed the triage vital signs and the nursing notes.  Pertinent labs & imaging results that were available during my care of the patient were reviewed by me and considered in my medical decision making (see chart for details).    MDM Rules/Calculators/A&P                          At the hour and a half mark clamp removed dressing removed no active bleeding.  Redressed with some Coban.  We will asked patient to leave that in place.  According to patient's daughter no significant bleeding.  So labs not done.  Vital signs without any significant abnormalities.  Patient is due for dialysis again on Monday.  As stated above patient completed her dialysis course today. Final Clinical Impression(s) / ED Diagnoses Final diagnoses:  Bleeding pseudoaneurysm of left brachiocephalic AV fistula ([place])    Rx / DC Orders ED Discharge Orders     None        Fredia Sorrow, MD 11/02/20 1242

## 2020-11-02 NOTE — Discharge Instructions (Addendum)
Bleeding now controlled.  Keep dressing in place till this evening.  Return for any recurrent bleeding.  Continue with dialysis as scheduled for Monday.  He completed dialysis course today.

## 2020-11-02 NOTE — ED Notes (Signed)
EDP at bedside. Clamp removed, bleeding has stopped. New dressing placed.

## 2020-11-08 DIAGNOSIS — Z20822 Contact with and (suspected) exposure to covid-19: Secondary | ICD-10-CM | POA: Diagnosis not present

## 2020-11-16 ENCOUNTER — Emergency Department (HOSPITAL_COMMUNITY): Payer: Medicare Other

## 2020-11-16 ENCOUNTER — Encounter (HOSPITAL_COMMUNITY): Payer: Self-pay

## 2020-11-16 ENCOUNTER — Emergency Department (HOSPITAL_COMMUNITY)
Admission: EM | Admit: 2020-11-16 | Discharge: 2020-11-16 | Disposition: A | Discharge status: Home / Self Care | Payer: Medicare Other | Attending: Emergency Medicine | Admitting: Emergency Medicine

## 2020-11-16 DIAGNOSIS — N186 End stage renal disease: Secondary | ICD-10-CM | POA: Insufficient documentation

## 2020-11-16 DIAGNOSIS — Z992 Dependence on renal dialysis: Secondary | ICD-10-CM | POA: Insufficient documentation

## 2020-11-16 DIAGNOSIS — M542 Cervicalgia: Secondary | ICD-10-CM | POA: Diagnosis not present

## 2020-11-16 DIAGNOSIS — Z8616 Personal history of COVID-19: Secondary | ICD-10-CM | POA: Diagnosis not present

## 2020-11-16 DIAGNOSIS — Z043 Encounter for examination and observation following other accident: Secondary | ICD-10-CM | POA: Diagnosis not present

## 2020-11-16 DIAGNOSIS — W01198A Fall on same level from slipping, tripping and stumbling with subsequent striking against other object, initial encounter: Secondary | ICD-10-CM | POA: Diagnosis not present

## 2020-11-16 DIAGNOSIS — Z794 Long term (current) use of insulin: Secondary | ICD-10-CM | POA: Insufficient documentation

## 2020-11-16 DIAGNOSIS — M25511 Pain in right shoulder: Secondary | ICD-10-CM | POA: Diagnosis not present

## 2020-11-16 DIAGNOSIS — Z7901 Long term (current) use of anticoagulants: Secondary | ICD-10-CM | POA: Diagnosis not present

## 2020-11-16 DIAGNOSIS — F17210 Nicotine dependence, cigarettes, uncomplicated: Secondary | ICD-10-CM | POA: Insufficient documentation

## 2020-11-16 DIAGNOSIS — E1122 Type 2 diabetes mellitus with diabetic chronic kidney disease: Secondary | ICD-10-CM | POA: Insufficient documentation

## 2020-11-16 DIAGNOSIS — I5042 Chronic combined systolic (congestive) and diastolic (congestive) heart failure: Secondary | ICD-10-CM | POA: Diagnosis not present

## 2020-11-16 DIAGNOSIS — I2510 Atherosclerotic heart disease of native coronary artery without angina pectoris: Secondary | ICD-10-CM | POA: Diagnosis not present

## 2020-11-16 DIAGNOSIS — Z79899 Other long term (current) drug therapy: Secondary | ICD-10-CM | POA: Insufficient documentation

## 2020-11-16 DIAGNOSIS — R410 Disorientation, unspecified: Secondary | ICD-10-CM | POA: Insufficient documentation

## 2020-11-16 DIAGNOSIS — I132 Hypertensive heart and chronic kidney disease with heart failure and with stage 5 chronic kidney disease, or end stage renal disease: Secondary | ICD-10-CM | POA: Insufficient documentation

## 2020-11-16 MED ORDER — ACETAMINOPHEN 325 MG PO TABS
650.0000 mg | ORAL_TABLET | Freq: Once | ORAL | Status: AC
  Administered 2020-11-16: 650 mg via ORAL
  Filled 2020-11-16: qty 2

## 2020-11-16 NOTE — ED Notes (Signed)
Paged Ortho  

## 2020-11-16 NOTE — ED Provider Notes (Signed)
[facility] EMERGENCY DEPARTMENT Provider Note   CSN: [id number] Arrival date & time: 11/16/20  1524     History Chief Complaint  Patient presents with   Shoulder Pain    Robin [name] is a 70 y.o. female.  The history is provided by the patient.  Shoulder Pain Location:  Shoulder Shoulder location:  R shoulder Injury: yes   Time since incident:  2 days Mechanism of injury: fall   Pain details:    Progression:  Unchanged Relieved by:  Nothing Worsened by:  Movement Associated symptoms: decreased range of motion and neck pain   Associated symptoms: no back pain, no fatigue, no fever, no muscle weakness, no numbness, no stiffness, no swelling and no tingling       Past Medical History:  Diagnosis Date   Acute delirium 11/18/2014   Acute encephalopathy    Acute on chronic respiratory failure with hypoxia ([place]) 09/09/2016   Acute respiratory failure with hypoxia (HCC) 11/29/2015   Anemia in chronic kidney disease 12/09/2014   Anxiety    Arthritis    Benign hypertension    Bipolar affective disorder ([place]) 12/09/2014   CAD (coronary artery disease), native coronary artery with 2 stents  11/17/2014   Charcot foot due to diabetes mellitus ([place])    Chronic combined systolic and diastolic CHF (congestive heart failure) ([place]) 11/18/2014   Closed left ankle fracture 11/17/2014   Confusion 01/21/2015   Depression    Diabetes mellitus without complication ([place])    End stage renal disease on dialysis ([place])    Fracture dislocation of ankle 11/17/2014   GERD (gastroesophageal reflux disease)    Heart murmur    History of blood transfusion    History of bronchitis    History of pneumonia    Hyperlipidemia 03/26/2015   Hypertension associated with diabetes ([place]) 11/18/2014   Hypertensive heart/renal disease with failure ([place]) 12/09/2014   Hypokalemia 11/17/2014   Multiple falls 01/21/2015   Obesity 11/17/2014   Onychomycosis 10/07/2016   Right leg DVT ([place]) 12/05/2015    SIRS (systemic inflammatory response syndrome) ([place]) 08/08/2016   Sleep apnea    Unstable angina ([place]) 12/26/2017   Ventricular tachycardia ([place])     Patient Active Problem List   Diagnosis Date Noted   AMS (altered mental status) 07/27/2020   COVID-19 01/10/2020   Hypoxia 01/10/2020   Chest pain 10/01/2019   History of pulmonary embolism 10/01/2019   Prolonged QT interval 10/01/2019   Hypertensive urgency    Other headache syndrome    Orthostatic dizziness 04/15/2019   Bradycardia 04/15/2019   Syncope    ESRD (end stage renal disease) ([place])    Chest pain at rest 09/16/2018   Diabetic Charct's arthropathy ([place]) 10/07/2016   GERD (gastroesophageal reflux disease) 09/09/2016   Depression 09/09/2016   Chronic diastolic (congestive) heart failure ([place]) 09/09/2016   Elevated troponin 09/09/2016   Hyperlipidemia associated with type 2 diabetes mellitus ([place]) 03/26/2015   Charcot ankle 03/16/2015   Anemia, chronic renal failure    NSVT (nonsustained ventricular tachycardia) ([place])    Hypertension due to end stage renal disease caused by type 2 diabetes mellitus, on dialysis ([place]) 12/09/2014   Bipolar affective disorder ([place]) 12/09/2014   Diabetes mellitus type 2, insulin dependent ([place]) 11/17/2014   CAD (coronary artery disease), native coronary artery with 2 stents  11/17/2014    Past Surgical History:  Procedure Laterality Date   A/V FISTULAGRAM N/A 01/03/2020   Procedure: A/V FISTULAGRAM;  Surgeon: Trula Slade,  Butch Penny, MD;  Location: [place] [facility];  Service: Cardiovascular;  Laterality: N/A;   ABDOMINAL HYSTERECTOMY     ANGIOPLASTY Left 01/26/2020   Procedure: LEFT ARM GRAPH THROMBECTOMY WITH BALLON ANGIOPLASTY;  Surgeon: Waynetta Sandy, MD;  Location: [place];  Service: Vascular;  Laterality: Left;   ANKLE CLOSED REDUCTION N/A 11/17/2014   Procedure: CLOSED REDUCTION ANKLE;  Surgeon: Earlie Server, MD;  Location: [place];  Service: Orthopedics;  Laterality: N/A;   ANKLE  FUSION Left 03/16/2015   Procedure: Left Tibiocalcaneal Fusion;  Surgeon: Newt Minion, MD;  Location: [place];  Service: Orthopedics;  Laterality: Left;   APPLICATION OF WOUND VAC Right 08/06/2016   Procedure: APPLICATION OF PREVENA WOUND VAC;  Surgeon: Newt Minion, MD;  Location: [place];  Service: Orthopedics;  Laterality: Right;   AV FISTULA PLACEMENT Left [id number]   done at [place]     2 stent    CHOLECYSTECTOMY     CORONARY STENT PLACEMENT     FISTULOGRAM Left 01/26/2020   Procedure: FISTULOGRAM;  Surgeon: Waynetta Sandy, MD;  Location: [place];  Service: Vascular;  Laterality: Left;   FOOT ARTHRODESIS Right 08/06/2016   Procedure: Right Foot Fusion Lisfranc Joint;  Surgeon: Newt Minion, MD;  Location: [place];  Service: Orthopedics;  Laterality: Right;   HARDWARE REMOVAL Left 03/16/2015   Procedure: Removal Hardware Left Ankle;  Surgeon: Newt Minion, MD;  Location: [place];  Service: Orthopedics;  Laterality: Left;   IR AV DIALY SHUNT INTRO NEEDLE/INTRACATH INITIAL W/PTA/IMG LEFT  01/05/2018   IR US GUIDE VASC ACCESS LEFT  01/05/2018   LEFT HEART CATH AND CORONARY ANGIOGRAPHY N/A 12/28/2017   Procedure: LEFT HEART CATH AND CORONARY ANGIOGRAPHY;  Surgeon: Burnell Blanks, MD;  Location: [place] [facility];  Service: Cardiovascular;  Laterality: N/A;   ORIF ANKLE FRACTURE Left 11/20/2014   Procedure: OPEN REDUCTION INTERNAL FIXATION (ORIF) ANKLE FRACTURE;  Surgeon: Renette Butters, MD;  Location: [place];  Service: Orthopedics;  Laterality: Left;   PERIPHERAL VASCULAR BALLOON ANGIOPLASTY  01/03/2020   Procedure: PERIPHERAL VASCULAR BALLOON ANGIOPLASTY;  Surgeon: Serafina Mitchell, MD;  Location: [place] [facility];  Service: Cardiovascular;;   TONSILLECTOMY       OB History   No obstetric history on file.     Family History  Family history unknown: Yes    Social History   Tobacco Use   Smoking status: Every Day    Packs/day: 1.00    Pack  years: 0.00    Types: Cigarettes   Smokeless tobacco: Never  Substance Use Topics   Alcohol use: No   Drug use: No    Home Medications Prior to Admission medications   Medication Sig Start Date End Date Taking? Authorizing Provider  acetaminophen (TYLENOL) 325 MG tablet Take 975 mg by mouth 3 (three) times a week.   Yes [provider]  albuterol (VENTOLIN HFA) 108 (90 Base) MCG/ACT inhaler Inhale 1-2 puffs into the lungs every 6 (six) hours as needed for wheezing or shortness of breath.   Yes [provider]  apixaban (ELIQUIS) 5 MG TABS tablet Take 2.5 mg by mouth 2 (two) times daily with a meal.   Yes [provider]  ARIPiprazole (ABILIFY) 2 MG tablet Take 2 mg by mouth at bedtime. Take with 1/2 20 mg tablet for a total dose of 12 mg   Yes [provider]  ARIPiprazole (ABILIFY) 20  MG tablet Take 10 mg by mouth at bedtime. Take with a 2 mg tablet for a total dose of 12 mg   Yes [provider]  atorvastatin (LIPITOR) 80 MG tablet Take 40 mg by mouth at bedtime.   Yes [provider]  benzonatate (TESSALON) 100 MG capsule Take by mouth 3 (three) times daily as needed for cough.   Yes [provider]  ferric citrate (AURYXIA) 1 GM 210 MG(Fe) tablet Take 420 mg by mouth 2 (two) times daily with a meal.   Yes [provider]  gabapentin (NEURONTIN) 100 MG capsule Take 100 mg by mouth 2 (two) times daily.   Yes [provider]  insulin detemir (LEVEMIR FLEXTOUCH) 100 UNIT/ML FlexPen Inject 35 Units into the skin daily with breakfast.   Yes [provider]  isosorbide mononitrate (IMDUR) 60 MG 24 hr tablet Take 60 mg by mouth daily with breakfast.   Yes [provider]  liraglutide (VICTOZA) 18 MG/3ML SOPN Inject 1.2 mg into the skin daily with breakfast.   Yes [provider]  lisinopril (ZESTRIL) 5 MG tablet Take 2.5 mg by mouth daily. 09/18/20  Yes [provider]  Melatonin 5  MG TABS Take 10 mg by mouth at bedtime.   Yes [provider]  metoprolol tartrate (LOPRESSOR) 50 MG tablet Take 200 mg by mouth daily.   Yes [provider]  multivitamin (RENA-VIT) TABS tablet Take 1 tablet by mouth at bedtime.  12/11/17  Yes [provider]  nitroGLYCERIN (NITROSTAT) 0.4 MG SL tablet Place 0.4 mg under the tongue every 5 (five) minutes as needed for chest pain.   Yes [provider]  pantoprazole (PROTONIX) 40 MG tablet Take 40 mg by mouth 2 (two) times daily before a meal.   Yes [provider]  Tiotropium Bromide Monohydrate (SPIRIVA RESPIMAT) 1.25 MCG/ACT AERS Inhale 2 puffs into the lungs every morning.   Yes [provider]  insulin aspart (NOVOLOG) 100 UNIT/ML injection Inject 6 Units into the skin 3 (three) times daily as needed for high blood sugar (CBG >300). Patient not taking: No sig reported    [provider]    Allergies    Patient has no known allergies.  Review of Systems   Review of Systems  Constitutional:  Negative for chills, fatigue and fever.  HENT:  Negative for ear pain and sore throat.   Eyes:  Negative for pain and visual disturbance.  Respiratory:  Negative for cough and shortness of breath.   Cardiovascular:  Negative for chest pain and palpitations.  Gastrointestinal:  Negative for abdominal pain and vomiting.  Genitourinary:  Negative for dysuria and hematuria.  Musculoskeletal:  Positive for arthralgias and neck pain. Negative for back pain and stiffness.  Skin:  Negative for color change and rash.  Neurological:  Negative for seizures and syncope.  All other systems reviewed and are negative.  Physical Exam Updated Vital Signs BP 132/90   Pulse 85   Temp 97.6 F (36.4 C)   Resp 20   Ht 5\' 4"  (1.626 m)   Wt 76.2 kg   SpO2 100%   BMI 28.84 kg/m   Physical Exam Vitals and nursing note reviewed.  Constitutional:      General: She is not in acute distress.     Appearance: She is well-developed. She is not ill-appearing.  HENT:     Head: Normocephalic and atraumatic.     Mouth/Throat:     Mouth: Mucous membranes  are moist.  Eyes:     Extraocular Movements: Extraocular movements intact.     Conjunctiva/sclera: Conjunctivae normal.     Pupils: Pupils are equal, round, and reactive to light.  Neck:     Comments: TTP to right sided paraspinal muscles  Cardiovascular:     Rate and Rhythm: Normal rate and regular rhythm.     Pulses: Normal pulses.     Heart sounds: Normal heart sounds. No murmur heard. Pulmonary:     Effort: Pulmonary effort is normal. No respiratory distress.     Breath sounds: Normal breath sounds.  Abdominal:     Palpations: Abdomen is soft.     Tenderness: There is no abdominal tenderness.  Musculoskeletal:        General: Tenderness (right shoulder) present. No deformity.     Cervical back: Normal range of motion and neck supple.     Comments: No midline spinal tenderness  Skin:    General: Skin is warm and dry.     Capillary Refill: Capillary refill takes less than 2 seconds.  Neurological:     General: No focal deficit present.     Mental Status: She is alert.     Cranial Nerves: No cranial nerve deficit.     Sensory: No sensory deficit.     Motor: No weakness.     Comments: 5+ out of 5 strength throughout, normal sensation    ED Results / Procedures / Treatments   Labs (all labs ordered are listed, but only abnormal results are displayed) Labs Reviewed - No data to display  EKG None  Radiology DG Shoulder Right  Result Date: 11/16/2020 CLINICAL DATA:  Shoulder pain after fall EXAM: RIGHT SHOULDER - 2+ VIEW COMPARISON:  None. FINDINGS: No fracture or malalignment. AC joint is intact. The right lung apex is clear IMPRESSION: No acute osseous abnormality Electronically Signed   By: Donavan Foil M.D.   On: 11/16/2020 17:05   CT Head Wo Contrast  Result Date: 11/16/2020 CLINICAL DATA:  Fall. EXAM: CT HEAD  WITHOUT CONTRAST CT CERVICAL SPINE WITHOUT CONTRAST TECHNIQUE: Multidetector CT imaging of the head and cervical spine was performed following the standard protocol without intravenous contrast. Multiplanar CT image reconstructions of the cervical spine were also generated. COMPARISON:  CT head July 27, 2020. MRI cervical spine October 25, 2003. FINDINGS: CT HEAD FINDINGS Brain: No evidence of acute large vascular territory infarction, hemorrhage, hydrocephalus, extra-axial collection or mass lesion/mass effect. Similar mild atrophy with ex vacuo ventricular dilation. Vascular: No hyperdense vessel identified. Calcific atherosclerosis. Skull: No acute fracture. Sinuses/Orbits: Visualized sinuses are largely clear. No acute orbital findings. Other: No mastoid effusions. CT CERVICAL SPINE FINDINGS Alignment: Approximately 3 mm of anterolisthesis of C4 on C5, most likely degenerative given severe degenerative disc disease and right-sided facet arthropathy at this level. Levocurvature. Skull base and vertebrae: No evidence of acute fracture Soft tissues and spinal canal: No prevertebral fluid or swelling. No visible canal hematoma. Disc levels: Severe degenerative disc disease at C4-C5 C5-C6. Severe facet arthropathy on the right at C4-C5. Upper chest: Visualized lung apices are clear. IMPRESSION: CT head: No evidence of acute intracranial abnormality. CT cervical spine: 1. No evidence of acute fracture. 2. Approximately 3 mm of anterolisthesis of C4 on C5, most likely degenerative given severe degenerative changes at this level. 3. Severe degenerative disc disease at C4-C5 and C5-C6 and severe facet arthropathy on the right at C4-C5. 4. Levocurvature. Electronically Signed   By: Margaretha Sheffield MD  On: 11/16/2020 17:02   CT Cervical Spine Wo Contrast  Result Date: 11/16/2020 CLINICAL DATA:  Fall. EXAM: CT HEAD WITHOUT CONTRAST CT CERVICAL SPINE WITHOUT CONTRAST TECHNIQUE: Multidetector CT imaging of the head and  cervical spine was performed following the standard protocol without intravenous contrast. Multiplanar CT image reconstructions of the cervical spine were also generated. COMPARISON:  CT head July 27, 2020. MRI cervical spine October 25, 2003. FINDINGS: CT HEAD FINDINGS Brain: No evidence of acute large vascular territory infarction, hemorrhage, hydrocephalus, extra-axial collection or mass lesion/mass effect. Similar mild atrophy with ex vacuo ventricular dilation. Vascular: No hyperdense vessel identified. Calcific atherosclerosis. Skull: No acute fracture. Sinuses/Orbits: Visualized sinuses are largely clear. No acute orbital findings. Other: No mastoid effusions. CT CERVICAL SPINE FINDINGS Alignment: Approximately 3 mm of anterolisthesis of C4 on C5, most likely degenerative given severe degenerative disc disease and right-sided facet arthropathy at this level. Levocurvature. Skull base and vertebrae: No evidence of acute fracture Soft tissues and spinal canal: No prevertebral fluid or swelling. No visible canal hematoma. Disc levels: Severe degenerative disc disease at C4-C5 C5-C6. Severe facet arthropathy on the right at C4-C5. Upper chest: Visualized lung apices are clear. IMPRESSION: CT head: No evidence of acute intracranial abnormality. CT cervical spine: 1. No evidence of acute fracture. 2. Approximately 3 mm of anterolisthesis of C4 on C5, most likely degenerative given severe degenerative changes at this level. 3. Severe degenerative disc disease at C4-C5 and C5-C6 and severe facet arthropathy on the right at C4-C5. 4. Levocurvature. Electronically Signed   By: Margaretha Sheffield MD   On: 11/16/2020 17:02    Procedures Procedures   Medications Ordered in ED Medications  acetaminophen (TYLENOL) tablet 650 mg (has no administration in time range)    ED Course  I have reviewed the triage vital signs and the nursing notes.  Pertinent labs & imaging results that were available during my care of the  patient were reviewed by me and considered in my medical decision making (see chart for details).    MDM Rules/Calculators/A&P                          Robin [name] is a 70 year old female who presents to the ED with right shoulder pain after fall yesterday.  She is on blood thinners.  Overall she appears well.  Neurologically she is intact.  She has some pain to the right shoulder with decreased range of motion.  No obvious deformity.  CT scan of head and neck are unremarkable.  X-ray of the right shoulder shows no fracture.  Overall suspect a bone bruise or may be a sprain or strain.  We will place her in a sling and have her follow-up with sports medicine.  Recommend ice, Tylenol.  Discharged in good condition.  Neurovascular neuromuscularly intact.  This chart was dictated using voice recognition software.  Despite best efforts to proofread,  errors can occur which can change the documentation meaning.   Final Clinical Impression(s) / ED Diagnoses Final diagnoses:  Acute pain of right shoulder    Rx / DC Orders ED Discharge Orders     None        Lennice Sites, DO 11/16/20 1817

## 2020-11-16 NOTE — Progress Notes (Signed)
Orthopedic Tech Progress Note Patient Details:  Robin [name] 11/03/1950 [id number]  Ortho Devices Type of Ortho Device: Sling immobilizer Ortho Device/Splint Location: RUE Ortho Device/Splint Interventions: Ordered, Application   Post Interventions Patient Tolerated: Well Instructions Provided: Adjustment of device  [name] 11/16/2020, 7:06 PM

## 2020-11-16 NOTE — ED Triage Notes (Signed)
Pt BIB [facility] from home after falling against a wall yesterday. No LOC or deformity to shoulder noted, pt c/o greater than 10/10 pain to right shoulder. Pt reports she was walking in the dark and feeling for the wall, lost her balance and fell against the wall. Pt is on eliquis. EMS reports guarding though pt was able to bear weight on joint to reposition self. Hx of dementia and kidney disease, MWF dialysis.

## 2020-11-16 NOTE — Discharge Instructions (Addendum)
Wear your sling for comfort.  Follow-up with sports medicine.  There is no fracture but you may have a bad shoulder strain or sprain.  Recommend ice, Tylenol for pain.

## 2020-11-16 NOTE — ED Notes (Signed)
The patient has requested something for pain and was inquiring about the sling for her arm so that her shoulder would not be so uncomfortable.

## 2020-12-30 DIAGNOSIS — Z20822 Contact with and (suspected) exposure to covid-19: Secondary | ICD-10-CM | POA: Diagnosis not present

## 2021-01-23 ENCOUNTER — Other Ambulatory Visit: Payer: Self-pay

## 2021-01-23 ENCOUNTER — Ambulatory Visit (INDEPENDENT_AMBULATORY_CARE_PROVIDER_SITE_OTHER): Payer: Medicare Other | Admitting: Family

## 2021-01-23 ENCOUNTER — Encounter: Payer: Self-pay | Admitting: Family

## 2021-01-23 DIAGNOSIS — E1142 Type 2 diabetes mellitus with diabetic polyneuropathy: Secondary | ICD-10-CM

## 2021-01-23 DIAGNOSIS — B351 Tinea unguium: Secondary | ICD-10-CM | POA: Diagnosis not present

## 2021-01-25 NOTE — Progress Notes (Signed)
Office Visit Note   Patient: Robin [name]           Date of Birth: 04-17-1951           MRN: [id number] Visit Date: 01/23/2021              Requested by: Center, [facility] [address] [place],  [place] [address] PCP: [place]  No chief complaint on file.     HPI: The patient is a 70 year old woman who presents today for bilateral foot evaluation.  She has not been seen in quite some time.  He did attempt to trim her own nails and unfortunately has some skin tears to the left great toe and second toe.  No systemic symptoms.  Assessment & Plan: Visit Diagnoses:  1. Onychomycosis   2. Diabetic peripheral neuropathy (HCC)     Plan: Nails were trimmed today without incident.  She will follow-up in the office in 3 months  Follow-Up Instructions: Return in about 3 months (around 04/24/2021).   Ortho Exam  Patient is alert, oriented, no adenopathy, well-dressed, normal affect, normal respiratory effort. On examination bilateral feet there is no edema she does have thickened and discolored onychomycotic nails x10 she is unable to safely trim her own nails due to neuropathy.  Superficial skin tears to the left great toe and second toe distal tips these have dried blood there is no depth no surrounding erythema no odor no sign of infection does have a palpable dorsalis pedis pulse bilaterally Imaging: No results found. No images are attached to the encounter.  Labs: Lab Results  Component Value Date   HGBA1C 8.7 (H) 07/27/2020   HGBA1C 8.1 (H) 01/10/2020   HGBA1C 7.9 (H) 10/01/2019   ESRSEDRATE 17 11/23/2019   CRP 0.9 01/15/2020   CRP 1.1 (H) 01/14/2020   CRP 2.0 (H) 01/13/2020   LABURIC 4.3 11/22/2019   LABURIC 5.4 12/01/2009   LABURIC 5.5 11/30/2009   REPTSTATUS 08/01/2020 FINAL 07/27/2020   GRAMSTAIN  03/14/2016    MODERATE WBC PRESENT,BOTH PMN AND MONONUCLEAR NO ORGANISMS SEEN    CULT  07/27/2020    NO GROWTH 5 DAYS Performed at [facility], [place] [address] [place], [place] [address]      Lab Results  Component Value Date   ALBUMIN 3.0 (L) 07/27/2020   ALBUMIN 2.6 (L) 01/31/2020   ALBUMIN 2.2 (L) 01/28/2020    Lab Results  Component Value Date   MG 2.6 (H) 07/27/2020   MG 1.8 01/15/2020   MG 2.1 01/14/2020   No results found for: VD25OH  No results found for: PREALBUMIN CBC EXTENDED Latest Ref Rng & Units 07/28/2020 07/27/2020 07/25/2020  WBC 4.0 - 10.5 K/uL 8.2 14.6(H) 9.3  RBC 3.87 - 5.11 MIL/uL 3.37(L) 3.60(L) 4.11  HGB 12.0 - 15.0 g/dL 10.2(L) 10.9(L) 12.2  HCT 36.0 - 46.0 % 33.4(L) 36.4 41.4  PLT 150 - 400 K/uL 202 224 221  NEUTROABS 1.7 - 7.7 K/uL - - 6.6  LYMPHSABS 0.7 - 4.0 K/uL - - 1.3     There is no height or weight on file to calculate BMI.  Orders:  No orders of the defined types were placed in this encounter.  No orders of the defined types were placed in this encounter.    Procedures: No procedures performed  Clinical Data: No additional findings.  ROS:  All other systems negative, except as noted in the HPI. Review of Systems  Objective: Vital  Signs: There were no vitals taken for this visit.  Specialty Comments:  No specialty comments available.  PMFS History: Patient Active Problem List   Diagnosis Date Noted   AMS (altered mental status) 07/27/2020   COVID-19 01/10/2020   Hypoxia 01/10/2020   Chest pain 10/01/2019   History of pulmonary embolism 10/01/2019   Prolonged QT interval 10/01/2019   Hypertensive urgency    Other headache syndrome    Orthostatic dizziness 04/15/2019   Bradycardia 04/15/2019   Syncope    ESRD (end stage renal disease) ([place])    Chest pain at rest 09/16/2018   Diabetic Charct's arthropathy ([place]) 10/07/2016   GERD (gastroesophageal reflux disease) 09/09/2016   Depression 09/09/2016   Chronic diastolic (congestive) heart failure ([place]) 09/09/2016   Elevated troponin 09/09/2016   Hyperlipidemia associated with type 2 diabetes mellitus  ([place]) 03/26/2015   Charcot ankle 03/16/2015   Anemia, chronic renal failure    NSVT (nonsustained ventricular tachycardia) ([place])    Hypertension due to end stage renal disease caused by type 2 diabetes mellitus, on dialysis ([place]) 12/09/2014   Bipolar affective disorder ([place]) 12/09/2014   Diabetes mellitus type 2, insulin dependent ([place]) 11/17/2014   CAD (coronary artery disease), native coronary artery with 2 stents  11/17/2014   Past Medical History:  Diagnosis Date   Acute delirium 11/18/2014   Acute encephalopathy    Acute on chronic respiratory failure with hypoxia ([place]) 09/09/2016   Acute respiratory failure with hypoxia ([place]) 11/29/2015   Anemia in chronic kidney disease 12/09/2014   Anxiety    Arthritis    Benign hypertension    Bipolar affective disorder ([place]) 12/09/2014   CAD (coronary artery disease), native coronary artery with 2 stents  11/17/2014   Charcot foot due to diabetes mellitus (HCC)    Chronic combined systolic and diastolic CHF (congestive heart failure) ([place]) 11/18/2014   Closed left ankle fracture 11/17/2014   Confusion 01/21/2015   Depression    Diabetes mellitus without complication (HCC)    End stage renal disease on dialysis ([place])    Fracture dislocation of ankle 11/17/2014   GERD (gastroesophageal reflux disease)    Heart murmur    History of blood transfusion    History of bronchitis    History of pneumonia    Hyperlipidemia 03/26/2015   Hypertension associated with diabetes ([place]) 11/18/2014   Hypertensive heart/renal disease with failure ([place]) 12/09/2014   Hypokalemia 11/17/2014   Multiple falls 01/21/2015   Obesity 11/17/2014   Onychomycosis 10/07/2016   Right leg DVT ([place]) 12/05/2015   SIRS (systemic inflammatory response syndrome) ([place]) 08/08/2016   Sleep apnea    Unstable angina ([place]) 12/26/2017   Ventricular tachycardia ([place])     Family History  Family history unknown: Yes    Past Surgical History:  Procedure Laterality Date   A/V FISTULAGRAM N/A  01/03/2020   Procedure: A/V FISTULAGRAM;  Surgeon: Serafina Mitchell, MD;  Location: [facility];  Service: Cardiovascular;  Laterality: N/A;   ABDOMINAL HYSTERECTOMY     ANGIOPLASTY Left 01/26/2020   Procedure: LEFT ARM GRAPH THROMBECTOMY WITH BALLON ANGIOPLASTY;  Surgeon: Waynetta Sandy, MD;  Location: [facility];  Service: Vascular;  Laterality: Left;   ANKLE CLOSED REDUCTION N/A 11/17/2014   Procedure: CLOSED REDUCTION ANKLE;  Surgeon: Earlie Server, MD;  Location: [facility];  Service: Orthopedics;  Laterality: N/A;   ANKLE FUSION Left 03/16/2015   Procedure: Left Tibiocalcaneal Fusion;  Surgeon: Newt Minion, MD;  Location: [facility];  Service:  Orthopedics;  Laterality: Left;   APPLICATION OF WOUND VAC Right 08/06/2016   Procedure: APPLICATION OF PREVENA WOUND VAC;  Surgeon: Newt Minion, MD;  Location: [facility];  Service: Orthopedics;  Laterality: Right;   AV FISTULA PLACEMENT Left [id number]   done at [facility]     2 stent    CHOLECYSTECTOMY     CORONARY STENT PLACEMENT     FISTULOGRAM Left 01/26/2020   Procedure: FISTULOGRAM;  Surgeon: Waynetta Sandy, MD;  Location: [facility];  Service: Vascular;  Laterality: Left;   FOOT ARTHRODESIS Right 08/06/2016   Procedure: Right Foot Fusion Lisfranc Joint;  Surgeon: Newt Minion, MD;  Location: [facility];  Service: Orthopedics;  Laterality: Right;   HARDWARE REMOVAL Left 03/16/2015   Procedure: Removal Hardware Left Ankle;  Surgeon: Newt Minion, MD;  Location: [facility];  Service: Orthopedics;  Laterality: Left;   IR AV DIALY SHUNT INTRO NEEDLE/INTRACATH INITIAL W/PTA/IMG LEFT  01/05/2018   IR US GUIDE VASC ACCESS LEFT  01/05/2018   LEFT HEART CATH AND CORONARY ANGIOGRAPHY N/A 12/28/2017   Procedure: LEFT HEART CATH AND CORONARY ANGIOGRAPHY;  Surgeon: Burnell Blanks, MD;  Location: [facility];  Service: Cardiovascular;  Laterality: N/A;   ORIF ANKLE FRACTURE Left 11/20/2014   Procedure: OPEN REDUCTION  INTERNAL FIXATION (ORIF) ANKLE FRACTURE;  Surgeon: Renette Butters, MD;  Location: [facility];  Service: Orthopedics;  Laterality: Left;   PERIPHERAL VASCULAR BALLOON ANGIOPLASTY  01/03/2020   Procedure: PERIPHERAL VASCULAR BALLOON ANGIOPLASTY;  Surgeon: Serafina Mitchell, MD;  Location: [facility];  Service: Cardiovascular;;   TONSILLECTOMY     Social History   Occupational History   Not on file  Tobacco Use   Smoking status: Every Day    Packs/day: 1.00    Types: Cigarettes   Smokeless tobacco: Never  Substance and Sexual Activity   Alcohol use: No   Drug use: No   Sexual activity: Never

## 2021-02-05 DIAGNOSIS — C188 Malignant neoplasm of overlapping sites of colon: Secondary | ICD-10-CM | POA: Diagnosis not present

## 2021-02-05 DIAGNOSIS — E119 Type 2 diabetes mellitus without complications: Secondary | ICD-10-CM | POA: Diagnosis not present

## 2021-02-05 DIAGNOSIS — Z1379 Encounter for other screening for genetic and chromosomal anomalies: Secondary | ICD-10-CM | POA: Diagnosis not present

## 2021-02-05 DIAGNOSIS — Z808 Family history of malignant neoplasm of other organs or systems: Secondary | ICD-10-CM | POA: Diagnosis not present

## 2021-02-07 DIAGNOSIS — Z20822 Contact with and (suspected) exposure to covid-19: Secondary | ICD-10-CM | POA: Diagnosis not present

## 2021-03-04 ENCOUNTER — Ambulatory Visit: Payer: No Typology Code available for payment source | Admitting: Orthopedic Surgery

## 2021-03-06 ENCOUNTER — Telehealth: Payer: Self-pay

## 2021-03-06 ENCOUNTER — Other Ambulatory Visit: Payer: Self-pay

## 2021-03-06 NOTE — Telephone Encounter (Signed)
Rec'v referral from Dr. Edrick Oh requesting fistulogram d/t drop in AV <25% - to scheduling

## 2021-03-12 ENCOUNTER — Encounter (HOSPITAL_COMMUNITY)
Admission: RE | Disposition: A | Discharge status: Home / Self Care | Payer: Self-pay | Source: Home / Self Care | Attending: Surgery

## 2021-03-12 ENCOUNTER — Encounter (HOSPITAL_COMMUNITY): Payer: Self-pay | Admitting: Surgery

## 2021-03-12 ENCOUNTER — Ambulatory Visit (INDEPENDENT_AMBULATORY_CARE_PROVIDER_SITE_OTHER): Payer: Medicare Other | Admitting: Orthopedic Surgery

## 2021-03-12 ENCOUNTER — Other Ambulatory Visit: Payer: Self-pay

## 2021-03-12 ENCOUNTER — Ambulatory Visit (HOSPITAL_COMMUNITY)
Admission: RE | Admit: 2021-03-12 | Discharge: 2021-03-12 | Disposition: A | Discharge status: Home / Self Care | Payer: No Typology Code available for payment source | Attending: Surgery | Admitting: Surgery

## 2021-03-12 DIAGNOSIS — N186 End stage renal disease: Secondary | ICD-10-CM | POA: Diagnosis not present

## 2021-03-12 DIAGNOSIS — Z992 Dependence on renal dialysis: Secondary | ICD-10-CM | POA: Insufficient documentation

## 2021-03-12 DIAGNOSIS — T82858A Stenosis of vascular prosthetic devices, implants and grafts, initial encounter: Secondary | ICD-10-CM | POA: Insufficient documentation

## 2021-03-12 DIAGNOSIS — I96 Gangrene, not elsewhere classified: Secondary | ICD-10-CM | POA: Diagnosis not present

## 2021-03-12 DIAGNOSIS — Y841 Kidney dialysis as the cause of abnormal reaction of the patient, or of later complication, without mention of misadventure at the time of the procedure: Secondary | ICD-10-CM | POA: Diagnosis not present

## 2021-03-12 DIAGNOSIS — T82898A Other specified complication of vascular prosthetic devices, implants and grafts, initial encounter: Secondary | ICD-10-CM

## 2021-03-12 HISTORY — PX: PERIPHERAL VASCULAR BALLOON ANGIOPLASTY: CATH118281

## 2021-03-12 HISTORY — PX: A/V FISTULAGRAM: CATH118298

## 2021-03-12 LAB — POCT I-STAT, CHEM 8
BUN: 17 mg/dL (ref 8–23)
Calcium, Ion: 1.2 mmol/L (ref 1.15–1.40)
Chloride: 93 mmol/L — ABNORMAL LOW (ref 98–111)
Creatinine, Ser: 5.6 mg/dL — ABNORMAL HIGH (ref 0.44–1.00)
Glucose, Bld: 174 mg/dL — ABNORMAL HIGH (ref 70–99)
HCT: 38 % (ref 36.0–46.0)
Hemoglobin: 12.9 g/dL (ref 12.0–15.0)
Potassium: 3.8 mmol/L (ref 3.5–5.1)
Sodium: 138 mmol/L (ref 135–145)
TCO2: 31 mmol/L (ref 22–32)

## 2021-03-12 LAB — GLUCOSE, CAPILLARY: Glucose-Capillary: 173 mg/dL — ABNORMAL HIGH (ref 70–99)

## 2021-03-12 SURGERY — A/V FISTULAGRAM
Anesthesia: LOCAL | Laterality: Left

## 2021-03-12 MED ORDER — HEPARIN (PORCINE) IN NACL 1000-0.9 UT/500ML-% IV SOLN
INTRAVENOUS | Status: AC
  Filled 2021-03-12: qty 500

## 2021-03-12 MED ORDER — SODIUM CHLORIDE 0.9 % IV SOLN: 250.0000 mL | INTRAVENOUS | Status: DC | PRN

## 2021-03-12 MED ORDER — HEPARIN (PORCINE) IN NACL 1000-0.9 UT/500ML-% IV SOLN
INTRAVENOUS | Status: DC | PRN
  Administered 2021-03-12: 500 mL

## 2021-03-12 MED ORDER — IODIXANOL 320 MG/ML IV SOLN
INTRAVENOUS | Status: DC | PRN
  Administered 2021-03-12: 18 mL

## 2021-03-12 MED ORDER — LIDOCAINE HCL (PF) 1 % IJ SOLN
INTRAMUSCULAR | Status: AC
  Filled 2021-03-12: qty 30

## 2021-03-12 MED ORDER — SODIUM CHLORIDE 0.9% FLUSH: 3.0000 mL | INTRAVENOUS | Status: DC | PRN

## 2021-03-12 MED ORDER — LIDOCAINE HCL (PF) 1 % IJ SOLN
INTRAMUSCULAR | Status: DC | PRN
  Administered 2021-03-12: 2 mL

## 2021-03-12 MED ORDER — SODIUM CHLORIDE 0.9% FLUSH: 3.0000 mL | Freq: Two times a day (BID) | INTRAVENOUS | Status: DC

## 2021-03-12 SURGICAL SUPPLY — 14 items
BAG SNAP BAND KOVER 36X36 (MISCELLANEOUS) ×2 IMPLANT
BALLN MUSTANG 10.0X40 75 (BALLOONS) ×2
BALLOON MUSTANG 10.0X40 75 (BALLOONS) ×1 IMPLANT
COVER DOME SNAP 22 D (MISCELLANEOUS) ×2 IMPLANT
KIT ENCORE 26 ADVANTAGE (KITS) ×2 IMPLANT
KIT MICROPUNCTURE NIT STIFF (SHEATH) ×2 IMPLANT
PROTECTION STATION PRESSURIZED (MISCELLANEOUS) ×2
SHEATH PINNACLE R/O II 7F 4CM (SHEATH) ×2 IMPLANT
SHEATH PROBE COVER 6X72 (BAG) ×4 IMPLANT
STATION PROTECTION PRESSURIZED (MISCELLANEOUS) ×1 IMPLANT
STOPCOCK MORSE 400PSI 3WAY (MISCELLANEOUS) ×2 IMPLANT
TRAY PV CATH (CUSTOM PROCEDURE TRAY) ×2 IMPLANT
TUBING CIL FLEX 10 FLL-RA (TUBING) ×2 IMPLANT
WIRE BENTSON .035X145CM (WIRE) ×2 IMPLANT

## 2021-03-12 NOTE — Op Note (Signed)
    [name] MRN: [id number] DOB: Aug 25, 1950 Sex: female  03/12/2021 Pre-operative Diagnosis: ESRD Post-operative diagnosis:  Same Surgeon:  Annamarie Major Procedure Performed:  1.  Ultrasound-guided access, left upper arm dialysis graft  2.  Shuntogram  3.  Venoplasty, left axillary vein (peripheral vein)   Indications: This is a 70 year old with end-stage renal disease and decreased flow rate to dialysis.  She is here today for fistulogram.  Procedure:  The patient was identified in the holding area and taken to room 8.  The patient was then placed supine on the table and prepped and draped in the usual sterile fashion.  A time out was called.  Ultrasound was used to evaluate the fistula.  The vein was patent and compressible.  A digital ultrasound image was acquired.  The fistula was then accessed under ultrasound guidance using a micropuncture needle.  An 018 wire was then asvanced without resistance and a micropuncture sheath was placed.  Contrast injections were then performed through the sheath.  Findings: Centrally system is widely patent.  There is an area of luminal narrowing of approximately 50% just beyond the stent in the upper arm.  The graft is widely patent as is the arterial venous anastomosis   Intervention: After the above images were acquired the decision was made to proceed with intervention.  Over a Bentson wire, a 7 French sheath was placed.  I selected a 10 x 40 Mustang balloon and performed balloon venoplasty of the axillary vein just beyond the stent taking the balloon to 12 atm.  Completion imaging revealed resolution of the stenosis.  The sheath was closed with a Monocryl.  There were no complications  Impression:  #1  Approximately 50 to 60% stenosis within the axillary vein, successfully dilated using a 10 mm balloon    V. Annamarie Major, M.D., [facility] [place] Office: [phone] Pager:  [phone]

## 2021-03-12 NOTE — H&P (Signed)
   [name] MRN: [id number] DOB: [birth year] Sex: female   HISTORY OF PRESENT ILLNESS:   Robin [name] is a 70 y.o. female with poorly functioning left arm dialysis access  CURRENT MEDICATIONS:    Current Facility-Administered Medications  Medication Dose Route Frequency Provider Last Rate Last Admin   0.9 %  sodium chloride infusion  250 mL Intravenous PRN Robin Mitchell, MD       sodium chloride flush (NS) 0.9 % injection 3 mL  3 mL Intravenous Q12H Robin Mitchell, MD       sodium chloride flush (NS) 0.9 % injection 3 mL  3 mL Intravenous PRN Robin Mitchell, MD        REVIEW OF SYSTEMS:   [X]  denotes positive finding, [ ]  denotes negative finding Cardiac  Comments:  Chest pain or chest pressure:    Shortness of breath upon exertion:    Short of breath when lying flat:    Irregular heart rhythm:    Constitutional    Fever or chills:      PHYSICAL EXAM:   Vitals:   03/12/21 0723  BP: (!) 145/82  Pulse: 98  Temp: (!) 97.5 F (36.4 C)  TempSrc: Oral  SpO2: 100%  Weight: 74.8 kg  Height: 5\' 4"  (1.626 m)    GENERAL: The patient is a well-nourished female, in no acute distress. The vital signs are documented above. CARDIOVASCULAR: There is a regular rate and rhythm. PULMONARY: Non-labored respirations   STUDIES:      MEDICAL ISSUES:   Plan for shuntogram with intervention.  All questions answered  Robin Alf, MD, FACS [facility] [phone] Pager [phone]

## 2021-03-13 ENCOUNTER — Telehealth: Payer: Self-pay

## 2021-03-13 ENCOUNTER — Telehealth: Payer: Self-pay | Admitting: Orthopedic Surgery

## 2021-03-13 NOTE — Telephone Encounter (Signed)
Please see below. This pt was seen in our office yesterday and was refereed last night for a stat vascular consult and ABI for gangrenous toes and needs VA approval first the [place] [facility] pcp is Dr. Boris Lown and the phone # is [phone] try calling first and see what is need to get approval  because this is a stat referral and she has an appt with vascular tomorrow. The fax number is [phone] it should not be a problem because this is the same vascular office she went to yesterday for her A fistula with Dr. Trula Slade.  You can ask Caryl Pina to help if they ask you to fill out VA form.

## 2021-03-13 NOTE — Telephone Encounter (Signed)
I spoke with Robin [name] at [facility]. She says that [facility] has already done the authorization and we should be getting an upload of this sent to [place]. She confirmed nothing else needs to be done for this.

## 2021-03-13 NOTE — Telephone Encounter (Signed)
Sonya with [facility] messaged and advised I could call Rona Ravens [phone] that she is the contact for vascular for the [place] ( they have revamped) I called and left a very detailed message to see if she can give approval for this referral. I advised Davy Pique that this has been done with vascular and awaiting further instructions. [facility] said that they might have another contact I will hold this message pending advisement from all

## 2021-03-13 NOTE — Telephone Encounter (Signed)
[facility] called and asking if the [facility] referral authorization has been done.   Angie: [phone] option 0 and ask for her.   "Referral was put in as Urgent and pt is scheduled tomorrow so they need it done stat."

## 2021-03-13 NOTE — Telephone Encounter (Signed)
I called and lm on vm again after call from vascular and vein to check the status of this referral. The office has been very helpful getting the pt scheduled and understanding the lack of communication from the [place] about this pt and getting the authorization. I left another message detailing yesterday's events and the need for this consult and ABIs the appt is tomorrow and Josem Kaufmann is needed. I advised the vascular office that I will call with update hopeful to hear back from someone today.

## 2021-03-13 NOTE — Telephone Encounter (Signed)
Called Angie at [facility] to confirm status of this appt.  Angie confirmed they have not sent in an authorization since they are not the referring office. If authorization is not sent in today prior to appt tomorrow they will have to file with medicare and pt will be stuck with big bill. I will be calling the [place] [facility] again to let them know one has not been done.

## 2021-03-13 NOTE — Telephone Encounter (Signed)
I spoke with Robin [name] through the [facility] authorizations [phone]), she says that she is not sure what needs to be done with any sort of approval for appt tomorrow. Larene Beach will contact Dr. Tammi Klippel and either she will call back or Larene Beach to give more detail on what needs to be done.

## 2021-03-13 NOTE — Telephone Encounter (Signed)
I called [phone] ext [phone] and was given the name and number for Frederica Kuster [phone] vascular coordinator for the [place] I called and lm on vm to call back gave my direct line and advised we saw the pt in the office yesterday and she has been referred over to Vascular for ABI and consult for possible revac surgery. This appt is for tomorrow and they are requiring the [place] prior to appt. I advised this is an urgent request and to please call me asap to discuss. The problem that I am having getting authorization is that the pt does not have an assigned PCP with the [place]. Will hold pending return call.

## 2021-03-14 ENCOUNTER — Ambulatory Visit (INDEPENDENT_AMBULATORY_CARE_PROVIDER_SITE_OTHER): Payer: Medicare Other | Admitting: Vascular Surgery

## 2021-03-14 ENCOUNTER — Other Ambulatory Visit: Payer: Self-pay

## 2021-03-14 ENCOUNTER — Encounter: Payer: Self-pay | Admitting: Orthopedic Surgery

## 2021-03-14 ENCOUNTER — Ambulatory Visit (HOSPITAL_COMMUNITY)
Admission: RE | Admit: 2021-03-14 | Discharge: 2021-03-14 | Disposition: A | Discharge status: Home / Self Care | Payer: Medicare Other | Source: Ambulatory Visit | Attending: Vascular Surgery | Admitting: Vascular Surgery

## 2021-03-14 ENCOUNTER — Encounter: Payer: Self-pay | Admitting: Vascular Surgery

## 2021-03-14 ENCOUNTER — Other Ambulatory Visit: Payer: Self-pay | Admitting: *Deleted

## 2021-03-14 VITALS — BP 125/73 | HR 87 | Temp 97.8°F | Resp 20 | Ht 64.0 in | Wt 152.0 lb

## 2021-03-14 DIAGNOSIS — I70299 Other atherosclerosis of native arteries of extremities, unspecified extremity: Secondary | ICD-10-CM

## 2021-03-14 DIAGNOSIS — I739 Peripheral vascular disease, unspecified: Secondary | ICD-10-CM

## 2021-03-14 DIAGNOSIS — L97909 Non-pressure chronic ulcer of unspecified part of unspecified lower leg with unspecified severity: Secondary | ICD-10-CM

## 2021-03-14 NOTE — Telephone Encounter (Signed)
Robin [name] with [facility] gave info for the [facility] generic referrals faxed office visit note and generic referral form to [phone] and also emailed the request to [email or web address]. will hold pending notification of approval.

## 2021-03-14 NOTE — Telephone Encounter (Signed)
Debria Garret called from the [place] and said that she was not able to give auth for this as she is not a patient of hers. Pt has received care in [facility]. [facility] will file pts medicare insurance for visit today.

## 2021-03-14 NOTE — Progress Notes (Signed)
ASSESSMENT & PLAN   PERIPHERAL ARTERIAL DISEASE WITH GANGRENE: This patient has evidence of infrainguinal arterial occlusive disease bilaterally.  She has dry gangrene on the toes of both feet.  She is diabetic and continues to smoke.  She is at high risk for amputation.  I have recommended that we proceed with arteriography to see if she has any options for revascularization.  She does have a history of a previous myocardial infarction and has congestive heart failure thus she would be at increased risk for open surgery.  Hopefully she will have an endovascular option.  She is on Eliquis and we will have to hold this for 48 hours prior to the procedure.  She dialyzes on Monday Wednesdays and Fridays so we will try to schedule this on a Tuesday or Thursday next week if the schedule allows. I have reviewed with the patient the indications for arteriography. In addition, I have reviewed the potential complications of arteriography including but not limited to: Bleeding, arterial injury, arterial thrombosis, dye action, or other unpredictable medical problems. I have explained to the patient that if we find disease amenable to angioplasty we could potentially address this at the same time. I have discussed the potential complications of angioplasty and stenting, including but not limited to: Bleeding, arterial thrombosis, arterial injury, dissection, or the need for surgical intervention.  We will make further recommendations pending these results.  REASON FOR CONSULT:    Peripheral arterial disease  HPI:   Robin [name] is a 70 y.o. female who was referred by Dr. Meridee Score with peripheral vascular disease.  This patient has dry gangrene involving the right fourth and fifth toes and the left great toe and second toe.  She is not sure how long she has had the wounds but thinks they have been present for many weeks.  She does not remember any specific injury to her feet.  She does describe some  claudication in her calves although I think her activity is fairly limited.  She denies any history of rest pain.  She actually states that elevating her legs make her legs feel better.  Her risk factors for peripheral vascular disease include type 2 diabetes, hypertension, hypercholesterolemia, and continued tobacco use.  She has end-stage renal disease and dialyzes on Monday Wednesdays and Fridays.  She has a functioning left upper arm fistula.  Past Medical History:  Diagnosis Date   Acute delirium 11/18/2014   Acute encephalopathy    Acute on chronic respiratory failure with hypoxia ([place]) 09/09/2016   Acute respiratory failure with hypoxia (HCC) 11/29/2015   Anemia in chronic kidney disease 12/09/2014   Anxiety    Arthritis    Benign hypertension    Bipolar affective disorder ([place]) 12/09/2014   CAD (coronary artery disease), native coronary artery with 2 stents  11/17/2014   Charcot foot due to diabetes mellitus ([place])    Chronic combined systolic and diastolic CHF (congestive heart failure) ([place]) 11/18/2014   Closed left ankle fracture 11/17/2014   Confusion 01/21/2015   Depression    Diabetes mellitus without complication ([place])    End stage renal disease on dialysis [facility])    Fracture dislocation of ankle 11/17/2014   GERD (gastroesophageal reflux disease)    Heart murmur    History of blood transfusion    History of bronchitis    History of pneumonia    Hyperlipidemia 03/26/2015   Hypertension associated with diabetes ([place]) 11/18/2014   Hypertensive heart/renal disease with failure ([place]) 12/09/2014  Hypokalemia 11/17/2014   Multiple falls 01/21/2015   Obesity 11/17/2014   Onychomycosis 10/07/2016   Right leg DVT ([place]) 12/05/2015   SIRS (systemic inflammatory response syndrome) ([place]) 08/08/2016   Sleep apnea    Unstable angina ([place]) 12/26/2017   Ventricular tachycardia     Family History  Family history unknown: Yes    SOCIAL HISTORY: Social History   Tobacco Use   Smoking  status: Every Day    Packs/day: 1.00    Types: Cigarettes   Smokeless tobacco: Never  Substance Use Topics   Alcohol use: No    No Known Allergies  Current Outpatient Medications  Medication Sig Dispense Refill   acetaminophen (TYLENOL) 325 MG tablet Take 975 mg by mouth 3 (three) times a week.     albuterol (VENTOLIN HFA) 108 (90 Base) MCG/ACT inhaler Inhale 1-2 puffs into the lungs every 6 (six) hours as needed for wheezing or shortness of breath.     apixaban (ELIQUIS) 5 MG TABS tablet Take 2.5 mg by mouth 2 (two) times daily with a meal.     ARIPiprazole (ABILIFY) 2 MG tablet Take 4 mg by mouth at bedtime. Take with 1/2 20 mg tablet for a total dose of 14 mg     ARIPiprazole (ABILIFY) 20 MG tablet Take 10 mg by mouth at bedtime. Take with a 4 mg tablet for a total dose of 14 mg     atorvastatin (LIPITOR) 80 MG tablet Take 40 mg by mouth at bedtime.     benzonatate (TESSALON) 100 MG capsule Take 100 mg by mouth 3 (three) times daily as needed for cough.     ferric citrate (AURYXIA) 1 GM 210 MG(Fe) tablet Take 420 mg by mouth 2 (two) times daily with a meal.     gabapentin (NEURONTIN) 100 MG capsule Take 100 mg by mouth 2 (two) times daily.     insulin aspart (NOVOLOG) 100 UNIT/ML injection Inject 6 Units into the skin 3 (three) times daily as needed for high blood sugar (CBG >300).     insulin detemir (LEVEMIR FLEXTOUCH) 100 UNIT/ML FlexPen Inject 35 Units into the skin daily.     isosorbide mononitrate (IMDUR) 60 MG 24 hr tablet Take 60 mg by mouth daily with breakfast.     liraglutide (VICTOZA) 18 MG/3ML SOPN Inject 1.2 mg into the skin daily with breakfast.     lisinopril (ZESTRIL) 5 MG tablet Take 2.5 mg by mouth daily.     Melatonin 10 MG TABS Take 10 mg by mouth at bedtime.     metoprolol (TOPROL-XL) 200 MG 24 hr tablet Take 200 mg by mouth daily.     multivitamin (RENA-VIT) TABS tablet Take 1 tablet by mouth at bedtime.   10   nitroGLYCERIN (NITROSTAT) 0.4 MG SL tablet Place  0.4 mg under the tongue every 5 (five) minutes as needed for chest pain.     pantoprazole (PROTONIX) 40 MG tablet Take 40 mg by mouth 2 (two) times daily before a meal.     Tiotropium Bromide Monohydrate (SPIRIVA RESPIMAT) 1.25 MCG/ACT AERS Inhale 2 puffs into the lungs every morning.     No current facility-administered medications for this visit.    REVIEW OF SYSTEMS:  [X]  denotes positive finding, [ ]  denotes negative finding Cardiac  Comments:  Chest pain or chest pressure:    Shortness of breath upon exertion:    Short of breath when lying flat:    Irregular heart rhythm:  Vascular    Pain in calf, thigh, or hip brought on by ambulation:    Pain in feet at night that wakes you up from your sleep:     Blood clot in your veins:    Leg swelling:         Pulmonary    Oxygen at home:    Productive cough:     Wheezing:         Neurologic    Sudden weakness in arms or legs:     Sudden numbness in arms or legs:     Sudden onset of difficulty speaking or slurred speech:    Temporary loss of vision in one eye:     Problems with dizziness:         Gastrointestinal    Blood in stool:     Vomited blood:         Genitourinary    Burning when urinating:     Blood in urine:        Psychiatric    Major depression:         Hematologic    Bleeding problems:    Problems with blood clotting too easily:        Skin    Rashes or ulcers: x       Constitutional    Fever or chills:    -  PHYSICAL EXAM:   Vitals:   03/14/21 1425  BP: 125/73  Pulse: 87  Resp: 20  Temp: 97.8 F (36.6 C)  SpO2: 97%  Weight: 152 lb (68.9 kg)  Height: 5\' 4"  (1.626 m)   Body mass index is 26.09 kg/m. GENERAL: The patient is a well-nourished female, in no acute distress. The vital signs are documented above. CARDIAC: There is a regular rate and rhythm.  VASCULAR: I do not detect carotid bruits. She has a good thrill in her left upper arm fistula. She has palpable femoral pulses. I  cannot palpate pedal pulses. PULMONARY: There is good air exchange bilaterally without wheezing or rales. ABDOMEN: Soft and non-tender with normal pitched bowel sounds.  I do not palpate an aneurysm. MUSCULOSKELETAL: There are no major deformities. NEUROLOGIC: No focal weakness or paresthesias are detected. SKIN: She has wounds on the right fourth and fifth toe and wounds on the left great toe and second toe.       PSYCHIATRIC: The patient has a normal affect.  DATA:    ARTERIAL DOPPLER STUDY: I have independently interpreted her arterial Doppler study today.  On the right side, there is a monophasic dorsalis pedis and posterior tibial signal.  ABI is 76% although this is likely falsely elevated secondary to calcific disease.  The toe pressures 30 mmHg.  On the left side there is a biphasic dorsalis pedis and posterior tibial signal.  The arteries are not compressible and an ABI could not be obtained.  Digital pressure is 31 mmHg.  Deitra Mayo [facility]

## 2021-03-14 NOTE — Progress Notes (Signed)
Office Visit Note   Patient: Robin [name]           Date of Birth: November 13, 1950           MRN: [id number] Visit Date: 03/12/2021              Requested by: [facility], [address] [place],  [place] [address] PCP: [facility] [place]  Chief Complaint  Patient presents with   Right Foot - Follow-up   Left Foot - Follow-up      HPI: Patient is a 70 year old woman who presents with acute pain of the forefoot bilaterally.  Patient has end-stage renal disease on dialysis.  Patient states that she had a fistulogram performed today.  She has no recent ankle-brachial indices.  Assessment & Plan: Visit Diagnoses:  1. Gangrene of left foot (HCC)   2. Gangrene of toe of right foot ([place])     Plan: Will refer patient urgently to vascular vein surgery for ABIs and vascular evaluation.  Follow-Up Instructions: Return if symptoms worsen or fail to improve.   Ortho Exam  Patient is alert, oriented, no adenopathy, well-dressed, normal affect, normal respiratory effort. Examination patient does not have a palpable pulse bilaterally.  She has black ischemic changes with dry gangrene of the right fourth toe as well as the left great toe and left second toe.  The feet are thin and atrophic there is no swelling no cellulitis no drainage.  Imaging: No results found. No images are attached to the encounter.  Labs: Lab Results  Component Value Date   HGBA1C 8.7 (H) 07/27/2020   HGBA1C 8.1 (H) 01/10/2020   HGBA1C 7.9 (H) 10/01/2019   ESRSEDRATE 17 11/23/2019   CRP 0.9 01/15/2020   CRP 1.1 (H) 01/14/2020   CRP 2.0 (H) 01/13/2020   LABURIC 4.3 11/22/2019   LABURIC 5.4 12/01/2009   LABURIC 5.5 11/30/2009   REPTSTATUS 08/01/2020 FINAL 07/27/2020   GRAMSTAIN  03/14/2016    MODERATE WBC PRESENT,BOTH PMN AND MONONUCLEAR NO ORGANISMS SEEN    CULT  07/27/2020    NO GROWTH 5 DAYS Performed at [facility], [place] [address] [place], [place] [address]      Lab Results   Component Value Date   ALBUMIN 3.0 (L) 07/27/2020   ALBUMIN 2.6 (L) 01/31/2020   ALBUMIN 2.2 (L) 01/28/2020    Lab Results  Component Value Date   MG 2.6 (H) 07/27/2020   MG 1.8 01/15/2020   MG 2.1 01/14/2020   No results found for: VD25OH  No results found for: PREALBUMIN CBC EXTENDED Latest Ref Rng & Units 03/12/2021 07/28/2020 07/27/2020  WBC 4.0 - 10.5 K/uL - 8.2 14.6(H)  RBC 3.87 - 5.11 MIL/uL - 3.37(L) 3.60(L)  HGB 12.0 - 15.0 g/dL 12.9 10.2(L) 10.9(L)  HCT 36.0 - 46.0 % 38.0 33.4(L) 36.4  PLT 150 - 400 K/uL - 202 224  NEUTROABS 1.7 - 7.7 K/uL - - -  LYMPHSABS 0.7 - 4.0 K/uL - - -     There is no height or weight on file to calculate BMI.  Orders:  Orders Placed This Encounter  Procedures   Ambulatory referral to Vascular Surgery   No orders of the defined types were placed in this encounter.    Procedures: No procedures performed  Clinical Data: No additional findings.  ROS:  All other systems negative, except as noted in the HPI. Review of Systems  Objective: Vital Signs: There were no vitals taken for  this visit.  Specialty Comments:  No specialty comments available.  PMFS History: Patient Active Problem List   Diagnosis Date Noted   AMS (altered mental status) 07/27/2020   COVID-19 01/10/2020   Hypoxia 01/10/2020   Chest pain 10/01/2019   History of pulmonary embolism 10/01/2019   Prolonged QT interval 10/01/2019   Hypertensive urgency    Other headache syndrome    Orthostatic dizziness 04/15/2019   Bradycardia 04/15/2019   Syncope    ESRD (end stage renal disease) ([place])    Chest pain at rest 09/16/2018   Diabetic Charct's arthropathy ([place]) 10/07/2016   GERD (gastroesophageal reflux disease) 09/09/2016   Depression 09/09/2016   Chronic diastolic (congestive) heart failure ([place]) 09/09/2016   Elevated troponin 09/09/2016   Hyperlipidemia associated with type 2 diabetes mellitus ([place]) 03/26/2015   Charcot ankle 03/16/2015   Anemia,  chronic renal failure    NSVT (nonsustained ventricular tachycardia) ([place])    Hypertension due to end stage renal disease caused by type 2 diabetes mellitus, on dialysis ([place]) 12/09/2014   Bipolar affective disorder ([place]) 12/09/2014   Diabetes mellitus type 2, insulin dependent ([place]) 11/17/2014   CAD (coronary artery disease), native coronary artery with 2 stents  11/17/2014   Past Medical History:  Diagnosis Date   Acute delirium 11/18/2014   Acute encephalopathy    Acute on chronic respiratory failure with hypoxia ([place]) 09/09/2016   Acute respiratory failure with hypoxia ([place]) 11/29/2015   Anemia in chronic kidney disease 12/09/2014   Anxiety    Arthritis    Benign hypertension    Bipolar affective disorder ([place]) 12/09/2014   CAD (coronary artery disease), native coronary artery with 2 stents  11/17/2014   Charcot foot due to diabetes mellitus (HCC)    Chronic combined systolic and diastolic CHF (congestive heart failure) ([place]) 11/18/2014   Closed left ankle fracture 11/17/2014   Confusion 01/21/2015   Depression    Diabetes mellitus without complication (HCC)    End stage renal disease on dialysis ([place])    Fracture dislocation of ankle 11/17/2014   GERD (gastroesophageal reflux disease)    Heart murmur    History of blood transfusion    History of bronchitis    History of pneumonia    Hyperlipidemia 03/26/2015   Hypertension associated with diabetes ([place]) 11/18/2014   Hypertensive heart/renal disease with failure ([place]) 12/09/2014   Hypokalemia 11/17/2014   Multiple falls 01/21/2015   Obesity 11/17/2014   Onychomycosis 10/07/2016   Right leg DVT ([place]) 12/05/2015   SIRS (systemic inflammatory response syndrome) ([place]) 08/08/2016   Sleep apnea    Unstable angina ([place]) 12/26/2017   Ventricular tachycardia     Family History  Family history unknown: Yes    Past Surgical History:  Procedure Laterality Date   A/V FISTULAGRAM N/A 01/03/2020   Procedure: A/V FISTULAGRAM;  Surgeon: Serafina Mitchell, MD;  Location: [facility];  Service: Cardiovascular;  Laterality: N/A;   A/V FISTULAGRAM Left 03/12/2021   Procedure: A/V FISTULAGRAM;  Surgeon: Serafina Mitchell, MD;  Location: [facility];  Service: Cardiovascular;  Laterality: Left;   ABDOMINAL HYSTERECTOMY     ANGIOPLASTY Left 01/26/2020   Procedure: LEFT ARM GRAPH THROMBECTOMY WITH BALLON ANGIOPLASTY;  Surgeon: Waynetta Sandy, MD;  Location: [facility];  Service: Vascular;  Laterality: Left;   ANKLE CLOSED REDUCTION N/A 11/17/2014   Procedure: CLOSED REDUCTION ANKLE;  Surgeon: Earlie Server, MD;  Location: [facility];  Service: Orthopedics;  Laterality: N/A;   ANKLE  FUSION Left 03/16/2015   Procedure: Left Tibiocalcaneal Fusion;  Surgeon: Newt Minion, MD;  Location: [facility];  Service: Orthopedics;  Laterality: Left;   APPLICATION OF WOUND VAC Right 08/06/2016   Procedure: APPLICATION OF PREVENA WOUND VAC;  Surgeon: Newt Minion, MD;  Location: [facility];  Service: Orthopedics;  Laterality: Right;   AV FISTULA PLACEMENT Left [id number]   done at [facility]     2 stent    CHOLECYSTECTOMY     CORONARY STENT PLACEMENT     FISTULOGRAM Left 01/26/2020   Procedure: FISTULOGRAM;  Surgeon: Waynetta Sandy, MD;  Location: [facility];  Service: Vascular;  Laterality: Left;   FOOT ARTHRODESIS Right 08/06/2016   Procedure: Right Foot Fusion Lisfranc Joint;  Surgeon: Newt Minion, MD;  Location: [facility];  Service: Orthopedics;  Laterality: Right;   HARDWARE REMOVAL Left 03/16/2015   Procedure: Removal Hardware Left Ankle;  Surgeon: Newt Minion, MD;  Location: [facility];  Service: Orthopedics;  Laterality: Left;   IR AV DIALY SHUNT INTRO NEEDLE/INTRACATH INITIAL W/PTA/IMG LEFT  01/05/2018   IR US GUIDE VASC ACCESS LEFT  01/05/2018   LEFT HEART CATH AND CORONARY ANGIOGRAPHY N/A 12/28/2017   Procedure: LEFT HEART CATH AND CORONARY ANGIOGRAPHY;  Surgeon: Burnell Blanks, MD;  Location: [facility];   Service: Cardiovascular;  Laterality: N/A;   ORIF ANKLE FRACTURE Left 11/20/2014   Procedure: OPEN REDUCTION INTERNAL FIXATION (ORIF) ANKLE FRACTURE;  Surgeon: Renette Butters, MD;  Location: [facility];  Service: Orthopedics;  Laterality: Left;   PERIPHERAL VASCULAR BALLOON ANGIOPLASTY  01/03/2020   Procedure: PERIPHERAL VASCULAR BALLOON ANGIOPLASTY;  Surgeon: Serafina Mitchell, MD;  Location: [facility];  Service: Cardiovascular;;   PERIPHERAL VASCULAR BALLOON ANGIOPLASTY Left 03/12/2021   Procedure: PERIPHERAL VASCULAR BALLOON ANGIOPLASTY;  Surgeon: Serafina Mitchell, MD;  Location: [facility];  Service: Cardiovascular;  Laterality: Left;   TONSILLECTOMY     Social History   Occupational History   Not on file  Tobacco Use   Smoking status: Every Day    Packs/day: 1.00    Types: Cigarettes   Smokeless tobacco: Never  Substance and Sexual Activity   Alcohol use: No   Drug use: No   Sexual activity: Never

## 2021-03-15 NOTE — Telephone Encounter (Signed)
Please see other notes for this encounter. No contact with any one at [place] that could help with any sort of auth for [facility] appt. They will file that appt with her insurance.

## 2021-03-21 ENCOUNTER — Ambulatory Visit (HOSPITAL_COMMUNITY)
Admission: RE | Admit: 2021-03-21 | Discharge: 2021-03-21 | Disposition: A | Discharge status: Home / Self Care | Payer: No Typology Code available for payment source | Attending: Vascular Surgery | Admitting: Vascular Surgery

## 2021-03-21 ENCOUNTER — Encounter (HOSPITAL_COMMUNITY)
Admission: RE | Disposition: A | Discharge status: Home / Self Care | Payer: Self-pay | Source: Home / Self Care | Attending: Vascular Surgery

## 2021-03-21 ENCOUNTER — Other Ambulatory Visit: Payer: Self-pay

## 2021-03-21 DIAGNOSIS — Z992 Dependence on renal dialysis: Secondary | ICD-10-CM | POA: Insufficient documentation

## 2021-03-21 DIAGNOSIS — E7800 Pure hypercholesterolemia, unspecified: Secondary | ICD-10-CM | POA: Insufficient documentation

## 2021-03-21 DIAGNOSIS — Z79899 Other long term (current) drug therapy: Secondary | ICD-10-CM | POA: Insufficient documentation

## 2021-03-21 DIAGNOSIS — I5042 Chronic combined systolic (congestive) and diastolic (congestive) heart failure: Secondary | ICD-10-CM | POA: Diagnosis not present

## 2021-03-21 DIAGNOSIS — Z7985 Long-term (current) use of injectable non-insulin antidiabetic drugs: Secondary | ICD-10-CM | POA: Diagnosis not present

## 2021-03-21 DIAGNOSIS — I130 Hypertensive heart and chronic kidney disease with heart failure and stage 1 through stage 4 chronic kidney disease, or unspecified chronic kidney disease: Secondary | ICD-10-CM | POA: Insufficient documentation

## 2021-03-21 DIAGNOSIS — Z794 Long term (current) use of insulin: Secondary | ICD-10-CM | POA: Insufficient documentation

## 2021-03-21 DIAGNOSIS — I252 Old myocardial infarction: Secondary | ICD-10-CM | POA: Insufficient documentation

## 2021-03-21 DIAGNOSIS — Z7901 Long term (current) use of anticoagulants: Secondary | ICD-10-CM | POA: Diagnosis not present

## 2021-03-21 DIAGNOSIS — N186 End stage renal disease: Secondary | ICD-10-CM | POA: Diagnosis not present

## 2021-03-21 DIAGNOSIS — E1152 Type 2 diabetes mellitus with diabetic peripheral angiopathy with gangrene: Secondary | ICD-10-CM | POA: Insufficient documentation

## 2021-03-21 DIAGNOSIS — L97519 Non-pressure chronic ulcer of other part of right foot with unspecified severity: Secondary | ICD-10-CM | POA: Diagnosis not present

## 2021-03-21 DIAGNOSIS — I70235 Atherosclerosis of native arteries of right leg with ulceration of other part of foot: Secondary | ICD-10-CM | POA: Diagnosis not present

## 2021-03-21 DIAGNOSIS — E1122 Type 2 diabetes mellitus with diabetic chronic kidney disease: Secondary | ICD-10-CM | POA: Diagnosis not present

## 2021-03-21 DIAGNOSIS — F17210 Nicotine dependence, cigarettes, uncomplicated: Secondary | ICD-10-CM | POA: Insufficient documentation

## 2021-03-21 DIAGNOSIS — L97529 Non-pressure chronic ulcer of other part of left foot with unspecified severity: Secondary | ICD-10-CM | POA: Diagnosis not present

## 2021-03-21 DIAGNOSIS — I70263 Atherosclerosis of native arteries of extremities with gangrene, bilateral legs: Secondary | ICD-10-CM | POA: Diagnosis not present

## 2021-03-21 HISTORY — PX: LOWER EXTREMITY ANGIOGRAPHY: CATH118251

## 2021-03-21 LAB — POCT I-STAT, CHEM 8
BUN: 33 mg/dL — ABNORMAL HIGH (ref 8–23)
Calcium, Ion: 1.16 mmol/L (ref 1.15–1.40)
Chloride: 90 mmol/L — ABNORMAL LOW (ref 98–111)
Creatinine, Ser: 6 mg/dL — ABNORMAL HIGH (ref 0.44–1.00)
Glucose, Bld: 185 mg/dL — ABNORMAL HIGH (ref 70–99)
HCT: 34 % — ABNORMAL LOW (ref 36.0–46.0)
Hemoglobin: 11.6 g/dL — ABNORMAL LOW (ref 12.0–15.0)
Potassium: 4.3 mmol/L (ref 3.5–5.1)
Sodium: 132 mmol/L — ABNORMAL LOW (ref 135–145)
TCO2: 34 mmol/L — ABNORMAL HIGH (ref 22–32)

## 2021-03-21 LAB — GLUCOSE, CAPILLARY: Glucose-Capillary: 127 mg/dL — ABNORMAL HIGH (ref 70–99)

## 2021-03-21 SURGERY — LOWER EXTREMITY ANGIOGRAPHY
Anesthesia: LOCAL

## 2021-03-21 MED ORDER — LABETALOL HCL 5 MG/ML IV SOLN
10.0000 mg | INTRAVENOUS | Status: DC | PRN
Start: 2021-03-21 — End: 2021-03-21

## 2021-03-21 MED ORDER — SODIUM CHLORIDE 0.9% FLUSH: 3.0000 mL | Freq: Two times a day (BID) | INTRAVENOUS | Status: DC

## 2021-03-21 MED ORDER — IODIXANOL 320 MG/ML IV SOLN
INTRAVENOUS | Status: DC | PRN
  Administered 2021-03-21: 97 mL via INTRA_ARTERIAL

## 2021-03-21 MED ORDER — ACETAMINOPHEN 325 MG PO TABS: 650.0000 mg | ORAL_TABLET | ORAL | Status: DC | PRN

## 2021-03-21 MED ORDER — LIDOCAINE HCL (PF) 1 % IJ SOLN
INTRAMUSCULAR | Status: AC
  Filled 2021-03-21: qty 30

## 2021-03-21 MED ORDER — SODIUM CHLORIDE 0.9% FLUSH: 3.0000 mL | INTRAVENOUS | Status: DC | PRN

## 2021-03-21 MED ORDER — HEPARIN (PORCINE) IN NACL 1000-0.9 UT/500ML-% IV SOLN
INTRAVENOUS | Status: DC | PRN
  Administered 2021-03-21 (×2): 500 mL

## 2021-03-21 MED ORDER — ONDANSETRON HCL 4 MG/2ML IJ SOLN: 4.0000 mg | Freq: Four times a day (QID) | INTRAMUSCULAR | Status: DC | PRN

## 2021-03-21 MED ORDER — HEPARIN (PORCINE) IN NACL 1000-0.9 UT/500ML-% IV SOLN
INTRAVENOUS | Status: AC
  Filled 2021-03-21: qty 1000

## 2021-03-21 MED ORDER — HYDRALAZINE HCL 20 MG/ML IJ SOLN: 5.0000 mg | INTRAMUSCULAR | Status: DC | PRN

## 2021-03-21 MED ORDER — SODIUM CHLORIDE 0.9 % IV SOLN: 250.0000 mL | INTRAVENOUS | Status: DC | PRN

## 2021-03-21 MED ORDER — LIDOCAINE HCL (PF) 1 % IJ SOLN
INTRAMUSCULAR | Status: DC | PRN
  Administered 2021-03-21: 18 mL via INTRADERMAL

## 2021-03-21 SURGICAL SUPPLY — 10 items
CATH OMNI FLUSH 5F 65CM (CATHETERS) ×2 IMPLANT
DEVICE CLOSURE MYNXGRIP 5F (Vascular Products) ×2 IMPLANT
KIT MICROPUNCTURE NIT STIFF (SHEATH) ×2 IMPLANT
KIT PV (KITS) ×2 IMPLANT
SHEATH PINNACLE 5F 10CM (SHEATH) ×2 IMPLANT
SHEATH PROBE COVER 6X72 (BAG) ×2 IMPLANT
SYR MEDRAD MARK 7 150ML (SYRINGE) ×2 IMPLANT
TRANSDUCER W/STOPCOCK (MISCELLANEOUS) ×2 IMPLANT
TRAY PV CATH (CUSTOM PROCEDURE TRAY) ×2 IMPLANT
WIRE BENTSON .035X145CM (WIRE) ×2 IMPLANT

## 2021-03-21 NOTE — H&P (Signed)
History and Physical Interval Note:  03/21/2021 11:15 AM  Robin [name]  has presented today for surgery, with the diagnosis of claudication.  The various methods of treatment have been discussed with the patient and family. After consideration of risks, benefits and other options for treatment, the patient has consented to  Procedure(s): LOWER EXTREMITY ANGIOGRAPHY (N/A) as a surgical intervention.  The patient's history has been reviewed, patient examined, no change in status, stable for surgery.  I have reviewed the patient's chart and labs.  Questions were answered to the patient's satisfaction.     Marty Heck    ASSESSMENT & PLAN    PERIPHERAL ARTERIAL DISEASE WITH GANGRENE: This patient has evidence of infrainguinal arterial occlusive disease bilaterally.  She has dry gangrene on the toes of both feet.  She is diabetic and continues to smoke.  She is at high risk for amputation.  I have recommended that we proceed with arteriography to see if she has any options for revascularization.  She does have a history of a previous myocardial infarction and has congestive heart failure thus she would be at increased risk for open surgery.  Hopefully she will have an endovascular option.  She is on Eliquis and we will have to hold this for 48 hours prior to the procedure.  She dialyzes on Monday Wednesdays and Fridays so we will try to schedule this on a Tuesday or Thursday next week if the schedule allows. I have reviewed with the patient the indications for arteriography. In addition, I have reviewed the potential complications of arteriography including but not limited to: Bleeding, arterial injury, arterial thrombosis, dye action, or other unpredictable medical problems. I have explained to the patient that if we find disease amenable to angioplasty we could potentially address this at the same time. I have discussed the potential complications of angioplasty and stenting, including but not  limited to: Bleeding, arterial thrombosis, arterial injury, dissection, or the need for surgical intervention.  We will make further recommendations pending these results.   REASON FOR CONSULT:     Peripheral arterial disease   HPI:    Robin [name] is a 70 y.o. female who was referred by Dr. Meridee Score with peripheral vascular disease.  This patient has dry gangrene involving the right fourth and fifth toes and the left great toe and second toe.  She is not sure how long she has had the wounds but thinks they have been present for many weeks.  She does not remember any specific injury to her feet.  She does describe some claudication in her calves although I think her activity is fairly limited.  She denies any history of rest pain.  She actually states that elevating her legs make her legs feel better.   Her risk factors for peripheral vascular disease include type 2 diabetes, hypertension, hypercholesterolemia, and continued tobacco use.   She has end-stage renal disease and dialyzes on Monday Wednesdays and Fridays.  She has a functioning left upper arm fistula.       Past Medical History:  Diagnosis Date   Acute delirium 11/18/2014   Acute encephalopathy     Acute on chronic respiratory failure with hypoxia (HCC) 09/09/2016   Acute respiratory failure with hypoxia (HCC) 11/29/2015   Anemia in chronic kidney disease 12/09/2014   Anxiety     Arthritis     Benign hypertension     Bipolar affective disorder ([place]) 12/09/2014   CAD (coronary artery disease), native coronary  artery with 2 stents  11/17/2014   Charcot foot due to diabetes mellitus ([place])     Chronic combined systolic and diastolic CHF (congestive heart failure) ([place]) 11/18/2014   Closed left ankle fracture 11/17/2014   Confusion 01/21/2015   Depression     Diabetes mellitus without complication (HCC)     End stage renal disease on dialysis [facility])     Fracture dislocation of ankle 11/17/2014   GERD (gastroesophageal reflux disease)      Heart murmur     History of blood transfusion     History of bronchitis     History of pneumonia     Hyperlipidemia 03/26/2015   Hypertension associated with diabetes ([place]) 11/18/2014   Hypertensive heart/renal disease with failure ([place]) 12/09/2014   Hypokalemia 11/17/2014   Multiple falls 01/21/2015   Obesity 11/17/2014   Onychomycosis 10/07/2016   Right leg DVT ([place]) 12/05/2015   SIRS (systemic inflammatory response syndrome) ([place]) 08/08/2016   Sleep apnea     Unstable angina ([place]) 12/26/2017   Ventricular tachycardia        Family History  Family history unknown: Yes      SOCIAL HISTORY: Social History         Tobacco Use   Smoking status: Every Day      Packs/day: 1.00      Types: Cigarettes   Smokeless tobacco: Never  Substance Use Topics   Alcohol use: No      No Known Allergies         Current Outpatient Medications  Medication Sig Dispense Refill   acetaminophen (TYLENOL) 325 MG tablet Take 975 mg by mouth 3 (three) times a week.       albuterol (VENTOLIN HFA) 108 (90 Base) MCG/ACT inhaler Inhale 1-2 puffs into the lungs every 6 (six) hours as needed for wheezing or shortness of breath.       apixaban (ELIQUIS) 5 MG TABS tablet Take 2.5 mg by mouth 2 (two) times daily with a meal.       ARIPiprazole (ABILIFY) 2 MG tablet Take 4 mg by mouth at bedtime. Take with 1/2 20 mg tablet for a total dose of 14 mg       ARIPiprazole (ABILIFY) 20 MG tablet Take 10 mg by mouth at bedtime. Take with a 4 mg tablet for a total dose of 14 mg       atorvastatin (LIPITOR) 80 MG tablet Take 40 mg by mouth at bedtime.       benzonatate (TESSALON) 100 MG capsule Take 100 mg by mouth 3 (three) times daily as needed for cough.       ferric citrate (AURYXIA) 1 GM 210 MG(Fe) tablet Take 420 mg by mouth 2 (two) times daily with a meal.       gabapentin (NEURONTIN) 100 MG capsule Take 100 mg by mouth 2 (two) times daily.       insulin aspart (NOVOLOG) 100 UNIT/ML injection Inject 6 Units into  the skin 3 (three) times daily as needed for high blood sugar (CBG >300).       insulin detemir (LEVEMIR FLEXTOUCH) 100 UNIT/ML FlexPen Inject 35 Units into the skin daily.       isosorbide mononitrate (IMDUR) 60 MG 24 hr tablet Take 60 mg by mouth daily with breakfast.       liraglutide (VICTOZA) 18 MG/3ML SOPN Inject 1.2 mg into the skin daily with breakfast.       lisinopril (ZESTRIL) 5 MG tablet Take 2.5  mg by mouth daily.       Melatonin 10 MG TABS Take 10 mg by mouth at bedtime.       metoprolol (TOPROL-XL) 200 MG 24 hr tablet Take 200 mg by mouth daily.       multivitamin (RENA-VIT) TABS tablet Take 1 tablet by mouth at bedtime.    10   nitroGLYCERIN (NITROSTAT) 0.4 MG SL tablet Place 0.4 mg under the tongue every 5 (five) minutes as needed for chest pain.       pantoprazole (PROTONIX) 40 MG tablet Take 40 mg by mouth 2 (two) times daily before a meal.       Tiotropium Bromide Monohydrate (SPIRIVA RESPIMAT) 1.25 MCG/ACT AERS Inhale 2 puffs into the lungs every morning.        No current facility-administered medications for this visit.      REVIEW OF SYSTEMS:  [X]  denotes positive finding, [ ]  denotes negative finding Cardiac   Comments:  Chest pain or chest pressure:      Shortness of breath upon exertion:      Short of breath when lying flat:      Irregular heart rhythm:             Vascular      Pain in calf, thigh, or hip brought on by ambulation:      Pain in feet at night that wakes you up from your sleep:       Blood clot in your veins:      Leg swelling:              Pulmonary      Oxygen at home:      Productive cough:       Wheezing:              Neurologic      Sudden weakness in arms or legs:       Sudden numbness in arms or legs:       Sudden onset of difficulty speaking or slurred speech:      Temporary loss of vision in one eye:       Problems with dizziness:              Gastrointestinal      Blood in stool:       Vomited blood:               Genitourinary      Burning when urinating:       Blood in urine:             Psychiatric      Major depression:              Hematologic      Bleeding problems:      Problems with blood clotting too easily:             Skin      Rashes or ulcers: x           Constitutional      Fever or chills:      -   PHYSICAL EXAM:       Vitals:    03/14/21 1425  BP: 125/73  Pulse: 87  Resp: 20  Temp: 97.8 F (36.6 C)  SpO2: 97%  Weight: 152 lb (68.9 kg)  Height: 5\' 4"  (1.626 m)    Body mass index is 26.09 kg/m. GENERAL: The patient is a well-nourished female, in no acute distress. The vital  signs are documented above. CARDIAC: There is a regular rate and rhythm.  VASCULAR: I do not detect carotid bruits. She has a good thrill in her left upper arm fistula. She has palpable femoral pulses. I cannot palpate pedal pulses. PULMONARY: There is good air exchange bilaterally without wheezing or rales. ABDOMEN: Soft and non-tender with normal pitched bowel sounds.  I do not palpate an aneurysm. MUSCULOSKELETAL: There are no major deformities. NEUROLOGIC: No focal weakness or paresthesias are detected. SKIN: She has wounds on the right fourth and fifth toe and wounds on the left great toe and second toe.       PSYCHIATRIC: The patient has a normal affect.   DATA:     ARTERIAL DOPPLER STUDY: I have independently interpreted her arterial Doppler study today.   On the right side, there is a monophasic dorsalis pedis and posterior tibial signal.  ABI is 76% although this is likely falsely elevated secondary to calcific disease.  The toe pressures 30 mmHg.   On the left side there is a biphasic dorsalis pedis and posterior tibial signal.  The arteries are not compressible and an ABI could not be obtained.  Digital pressure is 31 mmHg.   Deitra Mayo [facility]

## 2021-03-21 NOTE — Op Note (Signed)
    [name] MRN: [id number] DOB: [birth year] Sex: female  03/21/2021 Pre-operative Diagnosis: Bilateral lower extremity foot wounds Post-operative diagnosis:  Same Surgeon:  Marty Heck, MD Procedure Performed: 1.  Ultrasound-guided access right common femoral artery 2.  Aortogram with catheter selection of aorta 3.  Bilateral lower extremity arteriogram with runoff 4.  Mynx closure of the right common femoral artery  Indications: Patient is a 70 year old female with multiple risk factors including end-stage renal disease that was seen by my partner with bilateral lower extremity tissue loss with toe ulcers.  She presents today for bilateral lower extremity arteriogram to evaluate her runoff with initial focus on treatment of the left leg.  Risk benefits discussed.  Findings:   Aortogram showed the left renal artery that was widely patent and the right renal artery was not visualized.  There was no flow-limiting stenosis in the aortoiliac segment.  Left lower extremity arteriogram showed inline flow to the foot with two-vessel runoff.  The common femoral, profunda, SFA popliteal are all patent.  There is diffuse disease in the SFA with calcification but no more than 50% stenosis and nothing that appeared flow-limiting.  Two-vessel runoff into the foot via the peroneal and PT.  The anterior tibial is occluded shortly after take-off.  Right lower extremity arteriogram showed patent common femoral and profunda with two high-grade focal stenosis in the mid SFA and above-knee popliteal artery with again two-vessel runoff in the posterior tibial and peroneal artery.   Procedure:  The patient was identified in the holding area and taken to room 8.  The patient was then placed supine on the table and prepped and draped in the usual sterile fashion.  A time out was called.  Ultrasound was used to evaluate the right common femoral artery.  It was patent .  A digital ultrasound  image was acquired.  A micropuncture needle was used to access the right common femoral artery under ultrasound guidance.  An 018 wire was advanced without resistance and a micropuncture sheath was placed.  The 018 wire was removed and a benson wire was placed.  The micropuncture sheath was exchanged for a 5 french sheath.  An omniflush catheter was advanced over the wire to the level of L-1.  An abdominal angiogram was obtained.  Next the catheter was pulled down and bilateral lower extremity runoff was obtained.  We evaluated the images with initial focus on the left leg.  Contrast flow was very brisk down the leg with two-vessel runoff in the peroneal and posterior tibial.  There was no focal high-grade stenosis identified even though she has diffusely diseased calcified arteries.  No intervention was performed.  We will bring her back for staged intervention on the right leg.  Mynx closure device was deployed in the right groin after wires and catheters were removed.  Plan: No intervention was performed on the left lower extremity given two-vessel runoff and no flow-limiting stenosis identified.  We will schedule for staged intervention on the right lower extremity where she has high-grade stenosis in the mid SFA and above-knee popliteal artery.   Marty Heck, MD [facility] [place] Office: [phone]

## 2021-03-22 ENCOUNTER — Encounter (HOSPITAL_COMMUNITY): Payer: Self-pay | Admitting: Vascular Surgery

## 2021-03-22 ENCOUNTER — Other Ambulatory Visit: Payer: Self-pay

## 2021-03-26 DIAGNOSIS — E1021 Type 1 diabetes mellitus with diabetic nephropathy: Secondary | ICD-10-CM | POA: Diagnosis not present

## 2021-03-27 DIAGNOSIS — Z20822 Contact with and (suspected) exposure to covid-19: Secondary | ICD-10-CM | POA: Diagnosis not present

## 2021-03-28 ENCOUNTER — Ambulatory Visit (HOSPITAL_COMMUNITY)
Admission: RE | Admit: 2021-03-28 | Payer: No Typology Code available for payment source | Source: Home / Self Care | Admitting: Vascular Surgery

## 2021-03-28 ENCOUNTER — Encounter (HOSPITAL_COMMUNITY): Admission: RE | Payer: Self-pay | Source: Home / Self Care

## 2021-03-28 SURGERY — ABDOMINAL AORTOGRAM W/LOWER EXTREMITY
Anesthesia: LOCAL

## 2021-03-29 ENCOUNTER — Telehealth: Payer: Self-pay

## 2021-03-29 NOTE — Telephone Encounter (Signed)
Patient was scheduled to have right sided angio yesterday and procedure was cancelled due to [facility] hospital admission. Per daughter, patient is running a fever >100, very lethargic with altered mental status. Daughter calls to question VA plan for R TMA since patient did not undergo angio. She says the [place] mentioned sepsis and patient is currently on 3 antibiotics. Advised her that if the infection was stemming from her foot, revascularization wouldn't be a viable option and amputation would be the likely next step. She verbalized understanding. She may have the [facility] call the office to confirm plan. Advised if she still had questions to call back.

## 2021-04-24 DIAGNOSIS — E1021 Type 1 diabetes mellitus with diabetic nephropathy: Secondary | ICD-10-CM | POA: Diagnosis not present

## 2021-05-10 DIAGNOSIS — F0150 Vascular dementia without behavioral disturbance: Secondary | ICD-10-CM | POA: Diagnosis not present

## 2021-05-10 DIAGNOSIS — N186 End stage renal disease: Secondary | ICD-10-CM | POA: Diagnosis not present

## 2021-05-10 DIAGNOSIS — R4182 Altered mental status, unspecified: Secondary | ICD-10-CM | POA: Diagnosis not present

## 2021-05-10 DIAGNOSIS — I120 Hypertensive chronic kidney disease with stage 5 chronic kidney disease or end stage renal disease: Secondary | ICD-10-CM | POA: Diagnosis not present

## 2021-05-10 DIAGNOSIS — M869 Osteomyelitis, unspecified: Secondary | ICD-10-CM | POA: Diagnosis not present

## 2021-05-10 DIAGNOSIS — E119 Type 2 diabetes mellitus without complications: Secondary | ICD-10-CM | POA: Diagnosis not present

## 2021-05-10 DIAGNOSIS — I82403 Acute embolism and thrombosis of unspecified deep veins of lower extremity, bilateral: Secondary | ICD-10-CM | POA: Diagnosis not present

## 2021-05-10 DIAGNOSIS — Z89511 Acquired absence of right leg below knee: Secondary | ICD-10-CM | POA: Diagnosis not present

## 2021-05-10 DIAGNOSIS — Z20828 Contact with and (suspected) exposure to other viral communicable diseases: Secondary | ICD-10-CM | POA: Diagnosis not present

## 2021-05-10 DIAGNOSIS — I2510 Atherosclerotic heart disease of native coronary artery without angina pectoris: Secondary | ICD-10-CM | POA: Diagnosis not present

## 2021-05-10 DIAGNOSIS — R2689 Other abnormalities of gait and mobility: Secondary | ICD-10-CM | POA: Diagnosis not present

## 2021-05-10 DIAGNOSIS — I10 Essential (primary) hypertension: Secondary | ICD-10-CM | POA: Diagnosis not present

## 2021-05-10 DIAGNOSIS — R41841 Cognitive communication deficit: Secondary | ICD-10-CM | POA: Diagnosis not present

## 2021-05-10 DIAGNOSIS — R2681 Unsteadiness on feet: Secondary | ICD-10-CM | POA: Diagnosis not present

## 2021-05-10 DIAGNOSIS — F209 Schizophrenia, unspecified: Secondary | ICD-10-CM | POA: Diagnosis not present

## 2021-05-10 DIAGNOSIS — D631 Anemia in chronic kidney disease: Secondary | ICD-10-CM | POA: Diagnosis not present

## 2021-05-10 DIAGNOSIS — M6281 Muscle weakness (generalized): Secondary | ICD-10-CM | POA: Diagnosis not present

## 2021-05-10 DIAGNOSIS — E785 Hyperlipidemia, unspecified: Secondary | ICD-10-CM | POA: Diagnosis not present

## 2021-05-10 DIAGNOSIS — I739 Peripheral vascular disease, unspecified: Secondary | ICD-10-CM | POA: Diagnosis not present

## 2021-05-10 DIAGNOSIS — D649 Anemia, unspecified: Secondary | ICD-10-CM | POA: Diagnosis not present

## 2021-05-15 DIAGNOSIS — E785 Hyperlipidemia, unspecified: Secondary | ICD-10-CM | POA: Diagnosis not present

## 2021-05-15 DIAGNOSIS — F0150 Vascular dementia without behavioral disturbance: Secondary | ICD-10-CM | POA: Diagnosis not present

## 2021-05-15 DIAGNOSIS — M869 Osteomyelitis, unspecified: Secondary | ICD-10-CM | POA: Diagnosis not present

## 2021-05-15 DIAGNOSIS — E119 Type 2 diabetes mellitus without complications: Secondary | ICD-10-CM | POA: Diagnosis not present

## 2021-05-15 DIAGNOSIS — Z20828 Contact with and (suspected) exposure to other viral communicable diseases: Secondary | ICD-10-CM | POA: Diagnosis not present

## 2021-05-15 DIAGNOSIS — D631 Anemia in chronic kidney disease: Secondary | ICD-10-CM | POA: Diagnosis not present

## 2021-05-15 DIAGNOSIS — N186 End stage renal disease: Secondary | ICD-10-CM | POA: Diagnosis not present

## 2021-05-15 DIAGNOSIS — I120 Hypertensive chronic kidney disease with stage 5 chronic kidney disease or end stage renal disease: Secondary | ICD-10-CM | POA: Diagnosis not present

## 2021-05-25 DIAGNOSIS — E1021 Type 1 diabetes mellitus with diabetic nephropathy: Secondary | ICD-10-CM | POA: Diagnosis not present

## 2021-06-25 DIAGNOSIS — E1021 Type 1 diabetes mellitus with diabetic nephropathy: Secondary | ICD-10-CM | POA: Diagnosis not present

## 2021-07-19 DIAGNOSIS — Z832 Family history of diseases of the blood and blood-forming organs and certain disorders involving the immune mechanism: Secondary | ICD-10-CM | POA: Diagnosis not present

## 2021-07-19 DIAGNOSIS — M069 Rheumatoid arthritis, unspecified: Secondary | ICD-10-CM | POA: Diagnosis not present

## 2021-07-19 DIAGNOSIS — Z20822 Contact with and (suspected) exposure to covid-19: Secondary | ICD-10-CM | POA: Diagnosis not present

## 2021-07-19 DIAGNOSIS — Z1379 Encounter for other screening for genetic and chromosomal anomalies: Secondary | ICD-10-CM | POA: Diagnosis not present

## 2021-07-23 DIAGNOSIS — E1021 Type 1 diabetes mellitus with diabetic nephropathy: Secondary | ICD-10-CM | POA: Diagnosis not present

## 2021-07-31 DIAGNOSIS — Z20822 Contact with and (suspected) exposure to covid-19: Secondary | ICD-10-CM | POA: Diagnosis not present

## 2021-08-02 DIAGNOSIS — Z20822 Contact with and (suspected) exposure to covid-19: Secondary | ICD-10-CM | POA: Diagnosis not present

## 2021-08-12 DIAGNOSIS — Z20822 Contact with and (suspected) exposure to covid-19: Secondary | ICD-10-CM | POA: Diagnosis not present

## 2021-08-16 ENCOUNTER — Telehealth: Payer: Self-pay

## 2021-08-16 NOTE — Telephone Encounter (Signed)
Received referral from Dr. Edrick Oh requesting fistulogram d/t prolong bleeding - To scheduling  ?

## 2021-08-19 ENCOUNTER — Other Ambulatory Visit: Payer: Self-pay

## 2021-08-19 DIAGNOSIS — Z20822 Contact with and (suspected) exposure to covid-19: Secondary | ICD-10-CM | POA: Diagnosis not present

## 2021-08-22 ENCOUNTER — Encounter (HOSPITAL_COMMUNITY)
Admission: RE | Disposition: A | Discharge status: Home / Self Care | Payer: Self-pay | Source: Home / Self Care | Attending: Vascular Surgery

## 2021-08-22 ENCOUNTER — Other Ambulatory Visit: Payer: Self-pay

## 2021-08-22 ENCOUNTER — Ambulatory Visit (HOSPITAL_COMMUNITY)
Admission: RE | Admit: 2021-08-22 | Discharge: 2021-08-22 | Disposition: A | Discharge status: Home / Self Care | Payer: No Typology Code available for payment source | Attending: Vascular Surgery | Admitting: Vascular Surgery

## 2021-08-22 DIAGNOSIS — N185 Chronic kidney disease, stage 5: Secondary | ICD-10-CM | POA: Diagnosis not present

## 2021-08-22 DIAGNOSIS — Z7901 Long term (current) use of anticoagulants: Secondary | ICD-10-CM | POA: Diagnosis not present

## 2021-08-22 DIAGNOSIS — N186 End stage renal disease: Secondary | ICD-10-CM | POA: Diagnosis not present

## 2021-08-22 DIAGNOSIS — D631 Anemia in chronic kidney disease: Secondary | ICD-10-CM | POA: Diagnosis not present

## 2021-08-22 DIAGNOSIS — F319 Bipolar disorder, unspecified: Secondary | ICD-10-CM | POA: Insufficient documentation

## 2021-08-22 DIAGNOSIS — Z7985 Long-term (current) use of injectable non-insulin antidiabetic drugs: Secondary | ICD-10-CM | POA: Insufficient documentation

## 2021-08-22 DIAGNOSIS — I25110 Atherosclerotic heart disease of native coronary artery with unstable angina pectoris: Secondary | ICD-10-CM | POA: Diagnosis not present

## 2021-08-22 DIAGNOSIS — Z955 Presence of coronary angioplasty implant and graft: Secondary | ICD-10-CM | POA: Insufficient documentation

## 2021-08-22 DIAGNOSIS — Y841 Kidney dialysis as the cause of abnormal reaction of the patient, or of later complication, without mention of misadventure at the time of the procedure: Secondary | ICD-10-CM | POA: Diagnosis not present

## 2021-08-22 DIAGNOSIS — Z992 Dependence on renal dialysis: Secondary | ICD-10-CM | POA: Insufficient documentation

## 2021-08-22 DIAGNOSIS — T82858A Stenosis of vascular prosthetic devices, implants and grafts, initial encounter: Secondary | ICD-10-CM | POA: Insufficient documentation

## 2021-08-22 DIAGNOSIS — I5042 Chronic combined systolic (congestive) and diastolic (congestive) heart failure: Secondary | ICD-10-CM | POA: Insufficient documentation

## 2021-08-22 DIAGNOSIS — Z79899 Other long term (current) drug therapy: Secondary | ICD-10-CM | POA: Insufficient documentation

## 2021-08-22 DIAGNOSIS — I132 Hypertensive heart and chronic kidney disease with heart failure and with stage 5 chronic kidney disease, or end stage renal disease: Secondary | ICD-10-CM | POA: Insufficient documentation

## 2021-08-22 DIAGNOSIS — E1122 Type 2 diabetes mellitus with diabetic chronic kidney disease: Secondary | ICD-10-CM | POA: Insufficient documentation

## 2021-08-22 DIAGNOSIS — T82898A Other specified complication of vascular prosthetic devices, implants and grafts, initial encounter: Secondary | ICD-10-CM | POA: Diagnosis not present

## 2021-08-22 DIAGNOSIS — Z794 Long term (current) use of insulin: Secondary | ICD-10-CM | POA: Insufficient documentation

## 2021-08-22 DIAGNOSIS — F17210 Nicotine dependence, cigarettes, uncomplicated: Secondary | ICD-10-CM | POA: Diagnosis not present

## 2021-08-22 HISTORY — PX: PERIPHERAL VASCULAR BALLOON ANGIOPLASTY: CATH118281

## 2021-08-22 LAB — POCT I-STAT, CHEM 8
BUN: 17 mg/dL (ref 8–23)
Calcium, Ion: 1.25 mmol/L (ref 1.15–1.40)
Chloride: 95 mmol/L — ABNORMAL LOW (ref 98–111)
Creatinine, Ser: 4.7 mg/dL — ABNORMAL HIGH (ref 0.44–1.00)
Glucose, Bld: 177 mg/dL — ABNORMAL HIGH (ref 70–99)
HCT: 34 % — ABNORMAL LOW (ref 36.0–46.0)
Hemoglobin: 11.6 g/dL — ABNORMAL LOW (ref 12.0–15.0)
Potassium: 4 mmol/L (ref 3.5–5.1)
Sodium: 140 mmol/L (ref 135–145)
TCO2: 34 mmol/L — ABNORMAL HIGH (ref 22–32)

## 2021-08-22 SURGERY — PERIPHERAL VASCULAR BALLOON ANGIOPLASTY
Anesthesia: LOCAL

## 2021-08-22 MED ORDER — SODIUM CHLORIDE 0.9 % IV SOLN: 250.0000 mL | INTRAVENOUS | Status: DC | PRN

## 2021-08-22 MED ORDER — SODIUM CHLORIDE 0.9% FLUSH: 3.0000 mL | INTRAVENOUS | Status: DC | PRN

## 2021-08-22 MED ORDER — IODIXANOL 320 MG/ML IV SOLN
INTRAVENOUS | Status: DC | PRN
  Administered 2021-08-22: 40 mL

## 2021-08-22 MED ORDER — HEPARIN (PORCINE) IN NACL 1000-0.9 UT/500ML-% IV SOLN
INTRAVENOUS | Status: DC | PRN
  Administered 2021-08-22: 500 mL

## 2021-08-22 MED ORDER — HEPARIN SODIUM (PORCINE) 1000 UNIT/ML IJ SOLN
INTRAMUSCULAR | Status: DC | PRN
  Administered 2021-08-22: 3000 [IU] via INTRAVENOUS

## 2021-08-22 MED ORDER — LIDOCAINE HCL (PF) 1 % IJ SOLN
INTRAMUSCULAR | Status: DC | PRN
  Administered 2021-08-22: 2 mL

## 2021-08-22 MED ORDER — SODIUM CHLORIDE 0.9% FLUSH: 3.0000 mL | Freq: Two times a day (BID) | INTRAVENOUS | Status: DC

## 2021-08-22 SURGICAL SUPPLY — 18 items
BAG SNAP BAND KOVER 36X36 (MISCELLANEOUS) ×3 IMPLANT
BALLN MUSTANG 7X80X75 (BALLOONS) ×3
BALLOON MUSTANG 7X80X75 (BALLOONS) ×1 IMPLANT
CATH ANGIO 5F BER2 65CM (CATHETERS) ×2 IMPLANT
COVER DOME SNAP 22 D (MISCELLANEOUS) ×3 IMPLANT
DCB RANGER 7.0X200 150 (BALLOONS) ×1 IMPLANT
KIT ENCORE 26 ADVANTAGE (KITS) ×2 IMPLANT
KIT MICROPUNCTURE NIT STIFF (SHEATH) ×2 IMPLANT
PROTECTION STATION PRESSURIZED (MISCELLANEOUS) ×3
RANGER DCB 7.0X200 150 (BALLOONS) ×3
SHEATH PINNACLE R/O II 7F 4CM (SHEATH) ×2 IMPLANT
SHEATH PROBE COVER 6X72 (BAG) ×5 IMPLANT
STATION PROTECTION PRESSURIZED (MISCELLANEOUS) ×2 IMPLANT
STOPCOCK MORSE 400PSI 3WAY (MISCELLANEOUS) ×3 IMPLANT
TRAY PV CATH (CUSTOM PROCEDURE TRAY) ×3 IMPLANT
TUBING CIL FLEX 10 FLL-RA (TUBING) ×3 IMPLANT
WIRE BENTSON .035X145CM (WIRE) ×2 IMPLANT
WIRE SPARTACORE .014X300CM (WIRE) ×2 IMPLANT

## 2021-08-22 NOTE — H&P (Signed)
?H&P ? ? ? ? ?MRN #:  [id number] ? ?History of Present Illness: This is a 71 y.o. female with end-stage renal disease on hemodialysis Monday Wednesday Friday that presents for left arm fistulogram.  She describes prolonged bleeding from her left arm fistula over the last 2 months after dialysis.  Left upper arm AV graft that has been treated with venoplasty of axillary vein and venous connection in the past. ? ?Past Medical History:  ?Diagnosis Date  ? Acute delirium 11/18/2014  ? Acute encephalopathy   ? Acute on chronic respiratory failure with hypoxia ([place]) 09/09/2016  ? Acute respiratory failure with hypoxia ([place]) 11/29/2015  ? Anemia in chronic kidney disease 12/09/2014  ? Anxiety   ? Arthritis   ? Benign hypertension   ? Bipolar affective disorder ([place]) 12/09/2014  ? CAD (coronary artery disease), native coronary artery with 2 stents  11/17/2014  ? Charcot foot due to diabetes mellitus ([place])   ? Chronic combined systolic and diastolic CHF (congestive heart failure) ([place]) 11/18/2014  ? Closed left ankle fracture 11/17/2014  ? Confusion 01/21/2015  ? Depression   ? Diabetes mellitus without complication ([place])   ? End stage renal disease on dialysis [facility])   ? Fracture dislocation of ankle 11/17/2014  ? GERD (gastroesophageal reflux disease)   ? Heart murmur   ? History of blood transfusion   ? History of bronchitis   ? History of pneumonia   ? Hyperlipidemia 03/26/2015  ? Hypertension associated with diabetes ([place]) 11/18/2014  ? Hypertensive heart/renal disease with failure ([place]) 12/09/2014  ? Hypokalemia 11/17/2014  ? Multiple falls 01/21/2015  ? Obesity 11/17/2014  ? Onychomycosis 10/07/2016  ? Right leg DVT ([place]) 12/05/2015  ? SIRS (systemic inflammatory response syndrome) ([place]) 08/08/2016  ? Sleep apnea   ? Unstable angina ([place]) 12/26/2017  ? Ventricular tachycardia   ? ? ?Past Surgical History:  ?Procedure Laterality Date  ? A/V FISTULAGRAM N/A 01/03/2020  ? Procedure: A/V FISTULAGRAM;  Surgeon: Serafina Mitchell, MD;  Location:  [place] [facility];  Service: Cardiovascular;  Laterality: N/A;  ? A/V FISTULAGRAM Left 03/12/2021  ? Procedure: A/V FISTULAGRAM;  Surgeon: Serafina Mitchell, MD;  Location: [place] [facility];  Service: Cardiovascular;  Laterality: Left;  ? ABDOMINAL HYSTERECTOMY    ? ANGIOPLASTY Left 01/26/2020  ? Procedure: LEFT ARM GRAPH THROMBECTOMY WITH BALLON ANGIOPLASTY;  Surgeon: Waynetta Sandy, MD;  Location: [place];  Service: Vascular;  Laterality: Left;  ? ANKLE CLOSED REDUCTION N/A 11/17/2014  ? Procedure: CLOSED REDUCTION ANKLE;  Surgeon: Earlie Server, MD;  Location: [place];  Service: Orthopedics;  Laterality: N/A;  ? ANKLE FUSION Left 03/16/2015  ? Procedure: Left Tibiocalcaneal Fusion;  Surgeon: Newt Minion, MD;  Location: [place];  Service: Orthopedics;  Laterality: Left;  ? APPLICATION OF WOUND VAC Right 08/06/2016  ? Procedure: APPLICATION OF PREVENA WOUND VAC;  Surgeon: Newt Minion, MD;  Location: [place];  Service: Orthopedics;  Laterality: Right;  ? AV FISTULA PLACEMENT Left [id number]  ? done at [place]    ? 2 stent   ? CHOLECYSTECTOMY    ? CORONARY STENT PLACEMENT    ? FISTULOGRAM Left 01/26/2020  ? Procedure: FISTULOGRAM;  Surgeon: Waynetta Sandy, MD;  Location: [place];  Service: Vascular;  Laterality: Left;  ? FOOT ARTHRODESIS Right 08/06/2016  ? Procedure: Right Foot Fusion Lisfranc Joint;  Surgeon: Newt Minion, MD;  Location: [place];  Service: Orthopedics;  Laterality:  Right;  ? HARDWARE REMOVAL Left 03/16/2015  ? Procedure: Removal Hardware Left Ankle;  Surgeon: Newt Minion, MD;  Location: [place];  Service: Orthopedics;  Laterality: Left;  ? IR AV DIALY SHUNT INTRO NEEDLE/INTRACATH INITIAL W/PTA/IMG LEFT  01/05/2018  ? IR US GUIDE VASC ACCESS LEFT  01/05/2018  ? LEFT HEART CATH AND CORONARY ANGIOGRAPHY N/A 12/28/2017  ? Procedure: LEFT HEART CATH AND CORONARY ANGIOGRAPHY;  Surgeon: Burnell Blanks, MD;  Location: [place] [facility];  Service:  Cardiovascular;  Laterality: N/A;  ? LOWER EXTREMITY ANGIOGRAPHY N/A 03/21/2021  ? Procedure: LOWER EXTREMITY ANGIOGRAPHY;  Surgeon: Marty Heck, MD;  Location: [place] [facility];  Service: Cardiovascular;  Laterality: N/A;  ? ORIF ANKLE FRACTURE Left 11/20/2014  ? Procedure: OPEN REDUCTION INTERNAL FIXATION (ORIF) ANKLE FRACTURE;  Surgeon: Renette Butters, MD;  Location: [place];  Service: Orthopedics;  Laterality: Left;  ? PERIPHERAL VASCULAR BALLOON ANGIOPLASTY  01/03/2020  ? Procedure: PERIPHERAL VASCULAR BALLOON ANGIOPLASTY;  Surgeon: Serafina Mitchell, MD;  Location: [place] [facility];  Service: Cardiovascular;;  ? PERIPHERAL VASCULAR BALLOON ANGIOPLASTY Left 03/12/2021  ? Procedure: PERIPHERAL VASCULAR BALLOON ANGIOPLASTY;  Surgeon: Serafina Mitchell, MD;  Location: [place] [facility];  Service: Cardiovascular;  Laterality: Left;  ? TONSILLECTOMY    ? ? ?No Known Allergies ? ?Prior to Admission medications   ?Medication Sig Start Date End Date Taking? Authorizing Provider  ?acetaminophen (TYLENOL) 325 MG tablet Take 975 mg by mouth 3 (three) times a week.   Yes [provider]  ?apixaban (ELIQUIS) 5 MG TABS tablet Take 2.5 mg by mouth 2 (two) times daily with a meal.   Yes [provider]  ?ARIPiprazole (ABILIFY) 2 MG tablet Take 4 mg by mouth at bedtime. Take with 1/2 20 mg tablet for a total dose of 14 mg   Yes [provider]  ?ARIPiprazole (ABILIFY) 20 MG tablet Take 10 mg by mouth at bedtime. Take with a 4 mg tablet for a total dose of 14 mg   Yes [provider]  ?atorvastatin (LIPITOR) 80 MG tablet Take 40 mg by mouth at bedtime.   Yes [provider]  ?calcitRIOL (ROCALTROL) 0.25 MCG capsule Take 0.25 mcg by mouth daily.   Yes [provider]  ?gabapentin (NEURONTIN) 100 MG capsule Take 100 mg by mouth 2 (two) times daily.   Yes [provider]  ?insulin aspart (NOVOLOG) 100 UNIT/ML injection Inject 6 Units into the skin 3 (three)  times daily as needed for high blood sugar (CBG >300).   Yes [provider]  ?insulin detemir (LEVEMIR FLEXTOUCH) 100 UNIT/ML FlexPen Inject 35 Units into the skin daily.   Yes [provider]  ?isosorbide mononitrate (IMDUR) 60 MG 24 hr tablet Take 60 mg by mouth daily with breakfast.   Yes [provider]  ?lisinopril (ZESTRIL) 5 MG tablet Take 2.5 mg by mouth daily. 09/18/20  Yes [provider]  ?Melatonin 10 MG TABS Take 10 mg by mouth at bedtime.   Yes [provider]  ?metoprolol (TOPROL-XL) 200 MG 24 hr tablet Take 200 mg by mouth daily.   Yes [provider]  ?multivitamin (RENA-VIT) TABS tablet Take 1 tablet by mouth at bedtime.  12/11/17  Yes [provider]  ?pantoprazole (PROTONIX) 40 MG tablet Take 40 mg by mouth 2 (two) times daily before a meal.   Yes [provider]  ?Tiotropium Bromide Monohydrate (SPIRIVA RESPIMAT) 1.25 MCG/ACT AERS Inhale 2  puffs into the lungs every morning.   Yes [provider]  ?traZODone (DESYREL) 50 MG tablet Take 25 mg by mouth at bedtime.   Yes [provider]  ?albuterol (VENTOLIN HFA) 108 (90 Base) MCG/ACT inhaler Inhale 1-2 puffs into the lungs every 6 (six) hours as needed for wheezing or shortness of breath.    [provider]  ?benzonatate (TESSALON) 100 MG capsule Take 100 mg by mouth 3 (three) times daily as needed for cough.    [provider]  ?ferric citrate (AURYXIA) 1 GM 210 MG(Fe) tablet Take 420 mg by mouth 2 (two) times daily with a meal.    [provider]  ?liraglutide (VICTOZA) 18 MG/3ML SOPN Inject 1.2 mg into the skin daily with breakfast.    [provider]  ?nitroGLYCERIN (NITROSTAT) 0.4 MG SL tablet Place 0.4 mg under the tongue every 5 (five) minutes as needed for chest pain.    [provider]  ? ? ?Social History  ? ?Socioeconomic History  ? Marital status: Divorced  ?  Spouse name: Not on file  ? Number of  children: Not on file  ? Years of education: Not on file  ? Highest education level: Not on file  ?Occupational History  ? Not on file  ?Tobacco Use  ? Smoking status: Every Day  ?  Packs/day: 1.00  ?  Types: Cigarettes  ?

## 2021-08-22 NOTE — Op Note (Signed)
? ? ?  OPERATIVE NOTE ? ? ?PROCEDURE: ?left upper arm arteriovenous graft cannulation under ultrasound guidance ?left arm fistulaogram including central venogram ?left peripheral angioplasty of left upper arm AVG including proximal, mid, and distal upper arm including axillary vein and venous anastomosis (7 mm Mustang and 7 mm drug coated Ranger)   ? ?PRE-OPERATIVE DIAGNOSIS: Malfunctioning left arteriovenous arm graft ? ?POST-OPERATIVE DIAGNOSIS: same as above  ? ?SURGEON: Marty Heck, MD ? ?ANESTHESIA: local ? ?ESTIMATED BLOOD LOSS: 5 cc ? ?FINDING(S): No evidence of central venous stenosis.  The left upper arm AV graft had multiple stenotic segments anywhere from 40 to 90% in both the proximal mid and distal upper arm and was diseased throughout.  Ultimately all these regions were crossed with antegrade sheath access and angioplasty was performed with a 7 mm Mustang and 7 mm drug-coated Ranger.  No evidence of or significant residual stenosis.  Excellent thrill.  Multiple pseudoaneurysms along length of graft.   ? ?SPECIMEN(S):  None ? ?CONTRAST: 50 mL ? ?INDICATIONS: ?Robin [name] is a 71 y.o. female who  presents with malfunctioning left arm arteriovenous graft.  The patient is scheduled for left arm fistulogram.  The patient is aware the risks include but are not limited to: bleeding, infection, thrombosis of the cannulated access, and possible anaphylactic reaction to the contrast.  The patient is aware of the risks of the procedure and elects to proceed forward. ? ?DESCRIPTION: ?After full informed written consent was obtained, the patient was brought back to the angiography suite and placed supine upon the angiography table.  The patient was connected to monitoring equipment.  The left arm was prepped and draped in the standard fashion for a left arm fistulogram.  Under ultrasound guidance, the left arteriovenous graft was evaluated, it was patent, an image was saved.  It was cannulated with a  micropuncture needle.  The microwire was advanced into the fistula and the needle was exchanged for the a microsheath, which was lodged 2 cm into the access.  The wire was removed and the sheath was connected to the IV extension tubing.  Hand injections were completed to image the access from the antecubitum up to the level of axilla.  The central venous structures were also imaged by hand injections.  Ultimately elected for intervention with pertinent findings noted above.  A Bentson wire was placed in the micro sheath and exchanged for a short 7 [place] sheath.  Patient was given 3000 units of IV heparin.  I then crossed the stenosis in the upper arm graft with a KMP and Bentson wire and got my wire into the central venous structures.  The entire graft including the proximal mid and distal upper arm including the venous anastomosis was angioplastied with a 7 mm Mustang to nominal pressure for 2 minutes.  I then elected to treat the entire segment with a long 7 mm x 200 mm drug-coated Ranger after exchanging for a [place] core wire.  Excellent results with no residual stenosis.  There are multiple pseudoaneurysms along the graft with no flow limitation.  A 4-0 Monocryl purse-string suture was sewn around the sheath.  The sheath was removed while tying down the suture.  A sterile bandage was applied to the puncture site. ? ?COMPLICATIONS: None ? ?CONDITION: Stable ? ?Marty Heck, MD ?[facility] ?Office: [phone] ? ?Marty Heck ? ? ?08/22/2021 1:36 PM  ?

## 2021-08-22 NOTE — Progress Notes (Deleted)
Dr Caryl Comes informed of CXR report.  States OK to discharge home. ?

## 2021-08-22 NOTE — Progress Notes (Signed)
Patient complains of Chest Pain for the last hour. Dr. Carlis Abbott notified via text message. ?

## 2021-08-23 ENCOUNTER — Encounter (HOSPITAL_COMMUNITY): Payer: Self-pay | Admitting: Vascular Surgery

## 2021-08-23 DIAGNOSIS — E1021 Type 1 diabetes mellitus with diabetic nephropathy: Secondary | ICD-10-CM | POA: Diagnosis not present

## 2021-08-24 DIAGNOSIS — Z20822 Contact with and (suspected) exposure to covid-19: Secondary | ICD-10-CM | POA: Diagnosis not present

## 2021-08-26 DIAGNOSIS — Z20822 Contact with and (suspected) exposure to covid-19: Secondary | ICD-10-CM | POA: Diagnosis not present

## 2021-08-27 DIAGNOSIS — Z20822 Contact with and (suspected) exposure to covid-19: Secondary | ICD-10-CM | POA: Diagnosis not present

## 2021-08-31 DIAGNOSIS — Z20822 Contact with and (suspected) exposure to covid-19: Secondary | ICD-10-CM | POA: Diagnosis not present

## 2021-09-19 DIAGNOSIS — Z20822 Contact with and (suspected) exposure to covid-19: Secondary | ICD-10-CM | POA: Diagnosis not present

## 2021-09-22 DIAGNOSIS — E1021 Type 1 diabetes mellitus with diabetic nephropathy: Secondary | ICD-10-CM | POA: Diagnosis not present

## 2021-09-23 DIAGNOSIS — Z20822 Contact with and (suspected) exposure to covid-19: Secondary | ICD-10-CM | POA: Diagnosis not present

## 2021-09-30 DIAGNOSIS — Z20822 Contact with and (suspected) exposure to covid-19: Secondary | ICD-10-CM | POA: Diagnosis not present

## 2021-10-01 DIAGNOSIS — Z20822 Contact with and (suspected) exposure to covid-19: Secondary | ICD-10-CM | POA: Diagnosis not present

## 2021-10-02 DIAGNOSIS — Z20822 Contact with and (suspected) exposure to covid-19: Secondary | ICD-10-CM | POA: Diagnosis not present

## 2021-12-10 DIAGNOSIS — M869 Osteomyelitis, unspecified: Secondary | ICD-10-CM | POA: Insufficient documentation

## 2021-12-16 ENCOUNTER — Emergency Department (HOSPITAL_COMMUNITY)
Admission: EM | Admit: 2021-12-16 | Discharge: 2021-12-16 | Disposition: A | Discharge status: Home / Self Care | Payer: No Typology Code available for payment source | Attending: Emergency Medicine | Admitting: Emergency Medicine

## 2021-12-16 DIAGNOSIS — T829XXA Unspecified complication of cardiac and vascular prosthetic device, implant and graft, initial encounter: Secondary | ICD-10-CM | POA: Diagnosis present

## 2021-12-16 DIAGNOSIS — Z992 Dependence on renal dialysis: Secondary | ICD-10-CM | POA: Insufficient documentation

## 2021-12-16 DIAGNOSIS — Z7901 Long term (current) use of anticoagulants: Secondary | ICD-10-CM | POA: Insufficient documentation

## 2021-12-16 DIAGNOSIS — N186 End stage renal disease: Secondary | ICD-10-CM | POA: Diagnosis not present

## 2021-12-16 DIAGNOSIS — Z7982 Long term (current) use of aspirin: Secondary | ICD-10-CM | POA: Diagnosis not present

## 2021-12-16 DIAGNOSIS — I120 Hypertensive chronic kidney disease with stage 5 chronic kidney disease or end stage renal disease: Secondary | ICD-10-CM | POA: Insufficient documentation

## 2021-12-16 DIAGNOSIS — E1122 Type 2 diabetes mellitus with diabetic chronic kidney disease: Secondary | ICD-10-CM | POA: Insufficient documentation

## 2021-12-16 DIAGNOSIS — Z794 Long term (current) use of insulin: Secondary | ICD-10-CM | POA: Insufficient documentation

## 2021-12-16 NOTE — ED Triage Notes (Signed)
Pt BIB [facility] from home d/t fistula bleeding. Per EMS, pt had dialysis today. Little bleeding from the site after the treatment, pt was sent her home, then started bleeding this afternoon. Pt was bleeding all over the floor approximately 261m. Fire department put dressing over it. It does not appear to be bleeding at this moment. VSS. A&O X4.   BP 153/73

## 2021-12-16 NOTE — ED Provider Notes (Signed)
[facility] EMERGENCY DEPARTMENT Provider Note   CSN: [id number] Arrival date & time: 12/16/21  1541     History  No chief complaint on file.   Robin [name] is a 71 y.o. female.  HPI  Patient with medical end-stage renal disease dialysis today, diabetes, hypertension, QT prolongation, chronic anticoagulation on Eliquis presents today due to fistula bleeding.  Patient had please has no dialysis today, noticed bleeding from the fistula site this afternoon.  IR came, brought to ED after bandaging it.  The bleeding has stopped.  She denies any pain, skin ulcerations, lightheadedness, dizziness.  Home Medications Prior to Admission medications   Medication Sig Start Date End Date Taking? Authorizing Provider  acetaminophen (TYLENOL) 325 MG tablet Take 975 mg by mouth 3 (three) times a week.    [provider]  albuterol (VENTOLIN HFA) 108 (90 Base) MCG/ACT inhaler Inhale 1-2 puffs into the lungs every 6 (six) hours as needed for wheezing or shortness of breath.    [provider]  apixaban (ELIQUIS) 5 MG TABS tablet Take 2.5 mg by mouth 2 (two) times daily with a meal.    [provider]  ARIPiprazole (ABILIFY) 2 MG tablet Take 4 mg by mouth at bedtime. Take with 1/2 20 mg tablet for a total dose of 14 mg    [provider]  ARIPiprazole (ABILIFY) 20 MG tablet Take 10 mg by mouth at bedtime. Take with a 4 mg tablet for a total dose of 14 mg    [provider]  atorvastatin (LIPITOR) 80 MG tablet Take 40 mg by mouth at bedtime.    [provider]  benzonatate (TESSALON) 100 MG capsule Take 100 mg by mouth 3 (three) times daily as needed for cough.    [provider]  calcitRIOL (ROCALTROL) 0.25 MCG capsule Take 0.25 mcg by mouth daily.    [provider]  ferric citrate (AURYXIA) 1 GM 210 MG(Fe) tablet Take 420 mg by mouth 2 (two) times daily with a meal.    [provider]  gabapentin  (NEURONTIN) 100 MG capsule Take 100 mg by mouth 2 (two) times daily.    [provider]  insulin aspart (NOVOLOG) 100 UNIT/ML injection Inject 6 Units into the skin 3 (three) times daily as needed for high blood sugar (CBG >300).    [provider]  insulin detemir (LEVEMIR FLEXTOUCH) 100 UNIT/ML FlexPen Inject 35 Units into the skin daily.    [provider]  isosorbide mononitrate (IMDUR) 60 MG 24 hr tablet Take 60 mg by mouth daily with breakfast.    [provider]  liraglutide (VICTOZA) 18 MG/3ML SOPN Inject 1.2 mg into the skin daily with breakfast.    [provider]  lisinopril (ZESTRIL) 5 MG tablet Take 2.5 mg by mouth daily. 09/18/20   [provider]  Melatonin 10 MG TABS Take 10 mg by mouth at bedtime.    [provider]  metoprolol (TOPROL-XL) 200 MG 24 hr tablet Take 200 mg by mouth daily.    [provider]  multivitamin (RENA-VIT) TABS tablet Take 1 tablet by mouth at bedtime.  12/11/17   [provider]  nitroGLYCERIN (NITROSTAT) 0.4 MG SL tablet Place 0.4 mg under the tongue every 5 (five) minutes as needed for chest pain.    [provider]  pantoprazole (PROTONIX) 40 MG tablet Take 40 mg by mouth 2 (two) times daily before a meal.    [provider]  Tiotropium Bromide Monohydrate (SPIRIVA RESPIMAT) 1.25 MCG/ACT AERS Inhale 2 puffs into the lungs every morning.    [provider]  traZODone (DESYREL) 50 MG tablet Take 25 mg by mouth at bedtime.    [provider]      Allergies    Patient has no known allergies.    Review of Systems   Review of Systems  Physical Exam Updated Vital Signs BP 126/76 (BP Location: Right Arm)   Pulse 67   Temp 97.6 F (36.4 C) (Oral)   Resp 16  Physical Exam Vitals and nursing note reviewed. Exam conducted with a chaperone present.  Constitutional:      General: She is not in acute distress.    Appearance: Normal  appearance.  HENT:     Head: Normocephalic and atraumatic.  Eyes:     General: No scleral icterus.    Extraocular Movements: Extraocular movements intact.     Pupils: Pupils are equal, round, and reactive to light.  Cardiovascular:     Rate and Rhythm: Normal rate and regular rhythm.  Pulmonary:     Effort: Pulmonary effort is normal.     Breath sounds: Normal breath sounds.  Skin:    Capillary Refill: Capillary refill takes less than 2 seconds.     Coloration: Skin is not jaundiced.     Comments: AV fistula to left upper extremity.  No ulcers, palpable for thrill.  Small puncture from earlier access, not actively bleeding.  Neurological:     Mental Status: She is alert. Mental status is at baseline.     Coordination: Coordination normal.     ED Results / Procedures / Treatments   Labs (all labs ordered are listed, but only abnormal results are displayed) Labs Reviewed - No data to display  EKG None  Radiology No results found.  Procedures Procedures    Medications Ordered in ED Medications - No data to display  ED Course/ Medical Decision Making/ A&P                           Medical Decision Making  Patient presents due to AV fistula bleed.  She is asymptomatic other than some blood loss.    I inspected her fistula, there is palpable thrill, no ulcerations or skin discoloration, slight punctate wound from access site earlier but there is no active bleeding.  She is neurovascularly intact.   I consider checking hemoglobin given small amount of blood loss and the fact is asymptomatic do not think indicated emergently.    Patient stabilized and appropriate for outpatient follow-up, return precautions discussed.  Discussed HPI, physical exam and plan of care for this patient with attending Dr. Tomi Bamberger. The attending physician evaluated this patient as part of a shared visit and agrees with plan of care.         Final Clinical Impression(s) / ED  Diagnoses Final diagnoses:  Complication of AV dialysis fistula, initial encounter    Rx / DC Orders ED Discharge Orders     None         Sherrill Raring, Hershal Coria 12/16/21 1608    Dorie Rank, MD 12/17/21 [phone]

## 2021-12-16 NOTE — Discharge Instructions (Addendum)
You were seen today in the emergency department for bleeding from your fistula site.  This has stopped, I do not expect it to restart but  If it does apply gentle pressure, if you cannot stop the bleeding come to ED for further evaluation.

## 2022-01-21 NOTE — Progress Notes (Deleted)
HISTORY AND PHYSICAL     CC:  dialysis access Requesting Provider:  Reesa Chew, MD  HPI: This is a 71 y.o. female here for evaluation of her hemodialysis access.  See dialysis hx below.  She has had multiple interventions for bleeding from AVG in the past with the last being March 2023.  She is referred back for evaluation of ***  Dialysis access history: -left arm access placed at [facility] date unknown -shuntogram wit balloon venoplasty left axillary vein 01/03/2020 Dr. Trula Slade -left graft thrombectomy with 64m balloon angioplasty 01/26/2020 Dr. CDonzetta Matters-venoplasty left axillary vein 10/18/222 Dr. BTrula Slade-fistulogram with angioplasty of left upper arm AVG including proximal, mid, and distal upper arm including axillary vein and venous anastomosis 08/22/2021 Dr. CCarlis Abbott The pt is *** hand dominant.    Pt is on dialysis.   Days of dialysis if applicable:  M/W/F    HD center if applicable:  *** St location.   The pt is on a statin for cholesterol management.  The pt is not on a daily aspirin.  Other AC:  Eliquis The pt is on ACEI, BB for hypertension.  The pt does have diabetic.   Tobacco hx:  ***  Past Medical History:  Diagnosis Date   Acute delirium 11/18/2014   Acute encephalopathy    Acute on chronic respiratory failure with hypoxia (HCC) 09/09/2016   Acute respiratory failure with hypoxia (HCC) 11/29/2015   Anemia in chronic kidney disease 12/09/2014   Anxiety    Arthritis    Benign hypertension    Bipolar affective disorder ([place] 12/09/2014   CAD (coronary artery disease), native coronary artery with 2 stents  11/17/2014   Charcot foot due to diabetes mellitus ([place]    Chronic combined systolic and diastolic CHF (congestive heart failure) ([facility] 11/18/2014   Closed left ankle fracture 11/17/2014   Confusion 01/21/2015   Depression    Diabetes mellitus without complication (HCC)    End stage renal disease on dialysis ([place]    Fracture dislocation of ankle 11/17/2014   GERD  (gastroesophageal reflux disease)    Heart murmur    History of blood transfusion    History of bronchitis    History of pneumonia    Hyperlipidemia 03/26/2015   Hypertension associated with diabetes ([place] 11/18/2014   Hypertensive heart/renal disease with failure ([place] 12/09/2014   Hypokalemia 11/17/2014   Multiple falls 01/21/2015   Obesity 11/17/2014   Onychomycosis 10/07/2016   Right leg DVT ([place] 12/05/2015   SIRS (systemic inflammatory response syndrome) ([facility] 08/08/2016   Sleep apnea    Unstable angina ([place] 12/26/2017   Ventricular tachycardia     Past Surgical History:  Procedure Laterality Date   A/V FISTULAGRAM N/A 01/03/2020   Procedure: A/V FISTULAGRAM;  Surgeon: BSerafina Mitchell MD;  [facility];  Service: Cardiovascular;  Laterality: N/A;   A/V FISTULAGRAM Left 03/12/2021   Procedure: A/V FISTULAGRAM;  Surgeon: BSerafina Mitchell MD;  [facility];  Service: Cardiovascular;  Laterality: Left;   ABDOMINAL HYSTERECTOMY     ANGIOPLASTY Left 01/26/2020   Procedure: LEFT ARM GRAPH THROMBECTOMY WITH BALLON ANGIOPLASTY;  Surgeon: CWaynetta Sandy MD;  [place] [facility]  Service: Vascular;  Laterality: Left;   ANKLE CLOSED REDUCTION N/A 11/17/2014   Procedure: CLOSED REDUCTION ANKLE;  Surgeon: DEarlie Server MD;  [facility]  Service: Orthopedics;  Laterality: N/A;   ANKLE FUSION Left 03/16/2015   Procedure: Left Tibiocalcaneal Fusion;  Surgeon: MBeverely Low  Fernanda Drum, MD;  Location: [place];  Service: Orthopedics;  Laterality: Left;   APPLICATION OF WOUND VAC Right 08/06/2016   Procedure: APPLICATION OF PREVENA WOUND VAC;  Surgeon: Newt Minion, MD;  Location: [place];  Service: Orthopedics;  Laterality: Right;   AV FISTULA PLACEMENT Left [id number]   done at [place]     2 stent    CHOLECYSTECTOMY     CORONARY STENT PLACEMENT     FISTULOGRAM Left 01/26/2020   Procedure: FISTULOGRAM;  Surgeon: Waynetta Sandy, MD;   Location: [place];  Service: Vascular;  Laterality: Left;   FOOT ARTHRODESIS Right 08/06/2016   Procedure: Right Foot Fusion Lisfranc Joint;  Surgeon: Newt Minion, MD;  Location: [place];  Service: Orthopedics;  Laterality: Right;   HARDWARE REMOVAL Left 03/16/2015   Procedure: Removal Hardware Left Ankle;  Surgeon: Newt Minion, MD;  Location: [place];  Service: Orthopedics;  Laterality: Left;   IR AV DIALY SHUNT INTRO NEEDLE/INTRACATH INITIAL W/PTA/IMG LEFT  01/05/2018   IR US GUIDE VASC ACCESS LEFT  01/05/2018   LEFT HEART CATH AND CORONARY ANGIOGRAPHY N/A 12/28/2017   Procedure: LEFT HEART CATH AND CORONARY ANGIOGRAPHY;  Surgeon: Burnell Blanks, MD;  Location: [place] [facility] LAB;  Service: Cardiovascular;  Laterality: N/A;   LOWER EXTREMITY ANGIOGRAPHY N/A 03/21/2021   Procedure: LOWER EXTREMITY ANGIOGRAPHY;  Surgeon: Marty Heck, MD;  Location: [place] CV LAB;  Service: Cardiovascular;  Laterality: N/A;   ORIF ANKLE FRACTURE Left 11/20/2014   Procedure: OPEN REDUCTION INTERNAL FIXATION (ORIF) ANKLE FRACTURE;  Surgeon: Renette Butters, MD;  Location: [place];  Service: Orthopedics;  Laterality: Left;   PERIPHERAL VASCULAR BALLOON ANGIOPLASTY  01/03/2020   Procedure: PERIPHERAL VASCULAR BALLOON ANGIOPLASTY;  Surgeon: Serafina Mitchell, MD;  Location: [place] CV LAB;  Service: Cardiovascular;;   PERIPHERAL VASCULAR BALLOON ANGIOPLASTY Left 03/12/2021   Procedure: PERIPHERAL VASCULAR BALLOON ANGIOPLASTY;  Surgeon: Serafina Mitchell, MD;  Location: [place] CV LAB;  Service: Cardiovascular;  Laterality: Left;   PERIPHERAL VASCULAR BALLOON ANGIOPLASTY  08/22/2021   Procedure: PERIPHERAL VASCULAR BALLOON ANGIOPLASTY;  Surgeon: Marty Heck, MD;  Location: [place] CV LAB;  Service: Cardiovascular;;  Left AVF PTA   TONSILLECTOMY      No Known Allergies  Current Outpatient Medications  Medication Sig Dispense Refill   acetaminophen (TYLENOL) 325 MG tablet Take 975 mg  by mouth 3 (three) times a week.     albuterol (VENTOLIN HFA) 108 (90 Base) MCG/ACT inhaler Inhale 1-2 puffs into the lungs every 6 (six) hours as needed for wheezing or shortness of breath.     apixaban (ELIQUIS) 5 MG TABS tablet Take 2.5 mg by mouth 2 (two) times daily with a meal.     ARIPiprazole (ABILIFY) 2 MG tablet Take 4 mg by mouth at bedtime. Take with 1/2 20 mg tablet for a total dose of 14 mg     ARIPiprazole (ABILIFY) 20 MG tablet Take 10 mg by mouth at bedtime. Take with a 4 mg tablet for a total dose of 14 mg     atorvastatin (LIPITOR) 80 MG tablet Take 40 mg by mouth at bedtime.     benzonatate (TESSALON) 100 MG capsule Take 100 mg by mouth 3 (three) times daily as needed for cough.     calcitRIOL (ROCALTROL) 0.25 MCG capsule Take 0.25 mcg by mouth daily.     ferric citrate (AURYXIA) 1 GM 210 MG(Fe) tablet  Take 420 mg by mouth 2 (two) times daily with a meal.     gabapentin (NEURONTIN) 100 MG capsule Take 100 mg by mouth 2 (two) times daily.     insulin aspart (NOVOLOG) 100 UNIT/ML injection Inject 6 Units into the skin 3 (three) times daily as needed for high blood sugar (CBG >300).     insulin detemir (LEVEMIR FLEXTOUCH) 100 UNIT/ML FlexPen Inject 35 Units into the skin daily.     isosorbide mononitrate (IMDUR) 60 MG 24 hr tablet Take 60 mg by mouth daily with breakfast.     liraglutide (VICTOZA) 18 MG/3ML SOPN Inject 1.2 mg into the skin daily with breakfast.     lisinopril (ZESTRIL) 5 MG tablet Take 2.5 mg by mouth daily.     Melatonin 10 MG TABS Take 10 mg by mouth at bedtime.     metoprolol (TOPROL-XL) 200 MG 24 hr tablet Take 200 mg by mouth daily.     multivitamin (RENA-VIT) TABS tablet Take 1 tablet by mouth at bedtime.   10   nitroGLYCERIN (NITROSTAT) 0.4 MG SL tablet Place 0.4 mg under the tongue every 5 (five) minutes as needed for chest pain.     pantoprazole (PROTONIX) 40 MG tablet Take 40 mg by mouth 2 (two) times daily before a meal.     Tiotropium Bromide  Monohydrate (SPIRIVA RESPIMAT) 1.25 MCG/ACT AERS Inhale 2 puffs into the lungs every morning.     traZODone (DESYREL) 50 MG tablet Take 25 mg by mouth at bedtime.     No current facility-administered medications for this visit.    Family History  Family history unknown: Yes    Social History   Socioeconomic History   Marital status: Divorced    Spouse name: Not on file   Number of children: Not on file   Years of education: Not on file   Highest education level: Not on file  Occupational History   Not on file  Tobacco Use   Smoking status: Every Day    Packs/day: 1.00    Types: Cigarettes   Smokeless tobacco: Never  Vaping Use   Vaping Use: Never used  Substance and Sexual Activity   Alcohol use: No   Drug use: No   Sexual activity: Never  Other Topics Concern   Not on file  Social History Narrative   Not on file   Social Determinants of Health   Financial Resource Strain: Not on file  Food Insecurity: Not on file  Transportation Needs: Not on file  Physical Activity: Not on file  Stress: Not on file  Social Connections: Not on file  Intimate Partner Violence: Not on file     ROS: '[x]'$  Positive   '[ ]'$  Negative   '[ ]'$  All sytems reviewed and are negative *** Cardiac: '[]'$  chest pain/pressure '[]'$  SOB/DOE  Vascular: '[]'$  pain in legs while walking '[]'$  pain in feet when lying flat '[]'$  swelling in legs  Pulmonary: '[]'$  asthma '[]'$  wheezing  Neurologic: '[]'$  hx CVA/TIA  Hematologic: '[]'$  bleeding problems  GI '[]'$  GERD  GU: '[x]'$  CKD/renal failure  '[]'$  HD---'[]'$  M/W/F '[]'$  T/T/S  Psychiatric: '[]'$  hx of major depression  Integumentary: '[]'$  rashes '[]'$  ulcers  Constitutional: '[]'$  fever '[]'$  chills   PHYSICAL EXAMINATION:  ***   General:  WDWN female in NAD Gait: Not observed HENT: WNL Pulmonary: normal non-labored breathing  Cardiac: {Desc; regular/irreg:14544}, {With/Without:20273} carotid bruit*** Abdomen: soft, NT, no masses Skin: {With/Without:20273}  rashes Vascular Exam/Pulses:   Right Left  Radial {Exam; arterial pulse strength 0-4:30167} {Exam; arterial pulse strength 0-4:30167}  Ulnar {Exam; arterial pulse strength 0-4:30167} {Exam; arterial pulse strength 0-4:30167}   Extremities:  {With/Without:20273} ischemic changes, {With/Without:20273} Gangrene, {With/Without:20273} open wounds Musculoskeletal: no muscle wasting or atrophy  Neurologic: A&O X 3; Speech is fluent/normal  Non-Invasive Vascular Imaging:   Upper Extremity Vein Mapping on ***: ***   ASSESSMENT/PLAN: 71 y.o. female with ESRD here for evaluation of her hemodialysis access with hx of left arm AVG and multiple interventions for bleeding episodes presents today for ***  -*** -pt is on dialysis -discussed with pt that access does not last forever and will need intervention or even new access at some point.  -pt is *** hand dominant - will plan for *** -pt is on anticoagulation (Eliquis)   Leontine Locket, [facility] [phone]  Clinic MD:   Scot Dock

## 2022-01-23 ENCOUNTER — Ambulatory Visit: Payer: No Typology Code available for payment source

## 2022-04-27 ENCOUNTER — Other Ambulatory Visit: Payer: Self-pay

## 2022-04-27 ENCOUNTER — Emergency Department (HOSPITAL_COMMUNITY): Payer: No Typology Code available for payment source

## 2022-04-27 ENCOUNTER — Encounter (HOSPITAL_COMMUNITY): Payer: Self-pay | Admitting: *Deleted

## 2022-04-27 ENCOUNTER — Emergency Department (HOSPITAL_COMMUNITY)
Admission: EM | Admit: 2022-04-27 | Discharge: 2022-04-27 | Discharge status: Against Medical Advice | Payer: No Typology Code available for payment source | Attending: Emergency Medicine | Admitting: Emergency Medicine

## 2022-04-27 DIAGNOSIS — Z5321 Procedure and treatment not carried out due to patient leaving prior to being seen by health care provider: Secondary | ICD-10-CM | POA: Diagnosis not present

## 2022-04-27 DIAGNOSIS — I2510 Atherosclerotic heart disease of native coronary artery without angina pectoris: Secondary | ICD-10-CM | POA: Insufficient documentation

## 2022-04-27 DIAGNOSIS — N186 End stage renal disease: Secondary | ICD-10-CM | POA: Insufficient documentation

## 2022-04-27 DIAGNOSIS — R1110 Vomiting, unspecified: Secondary | ICD-10-CM | POA: Insufficient documentation

## 2022-04-27 DIAGNOSIS — Z992 Dependence on renal dialysis: Secondary | ICD-10-CM | POA: Diagnosis not present

## 2022-04-27 DIAGNOSIS — I509 Heart failure, unspecified: Secondary | ICD-10-CM | POA: Diagnosis not present

## 2022-04-27 DIAGNOSIS — R1010 Upper abdominal pain, unspecified: Secondary | ICD-10-CM | POA: Diagnosis present

## 2022-04-27 LAB — COMPREHENSIVE METABOLIC PANEL
ALT: 18 U/L (ref 0–44)
AST: 34 U/L (ref 15–41)
Albumin: 3.6 g/dL (ref 3.5–5.0)
Alkaline Phosphatase: 97 U/L (ref 38–126)
Anion gap: 21 — ABNORMAL HIGH (ref 5–15)
BUN: 32 mg/dL — ABNORMAL HIGH (ref 8–23)
CO2: 21 mmol/L — ABNORMAL LOW (ref 22–32)
Calcium: 10.6 mg/dL — ABNORMAL HIGH (ref 8.9–10.3)
Chloride: 94 mmol/L — ABNORMAL LOW (ref 98–111)
Creatinine, Ser: 7.58 mg/dL — ABNORMAL HIGH (ref 0.44–1.00)
GFR, Estimated: 5 mL/min — ABNORMAL LOW (ref >60)
Glucose, Bld: 109 mg/dL — ABNORMAL HIGH (ref 70–99)
Potassium: 4.2 mmol/L (ref 3.5–5.1)
Sodium: 136 mmol/L (ref 135–145)
Total Bilirubin: 1.4 mg/dL — ABNORMAL HIGH (ref 0.3–1.2)
Total Protein: 7.5 g/dL (ref 6.5–8.1)

## 2022-04-27 LAB — CBC WITH DIFFERENTIAL/PLATELET
Abs Immature Granulocytes: 0.04 10*3/uL (ref 0.00–0.07)
Basophils Absolute: 0.1 10*3/uL (ref 0.0–0.1)
Basophils Relative: 1 %
Eosinophils Absolute: 0 10*3/uL (ref 0.0–0.5)
Eosinophils Relative: 0 %
HCT: 37.9 % (ref 36.0–46.0)
Hemoglobin: 12.1 g/dL (ref 12.0–15.0)
Immature Granulocytes: 1 %
Lymphocytes Relative: 10 %
Lymphs Abs: 0.9 10*3/uL (ref 0.7–4.0)
MCH: 29.9 pg (ref 26.0–34.0)
MCHC: 31.9 g/dL (ref 30.0–36.0)
MCV: 93.6 fL (ref 80.0–100.0)
Monocytes Absolute: 0.7 10*3/uL (ref 0.1–1.0)
Monocytes Relative: 9 %
Neutro Abs: 6.9 10*3/uL (ref 1.7–7.7)
Neutrophils Relative %: 79 %
Platelets: 171 10*3/uL (ref 150–400)
RBC: 4.05 MIL/uL (ref 3.87–5.11)
RDW: 20.5 % — ABNORMAL HIGH (ref 11.5–15.5)
WBC: 8.6 10*3/uL (ref 4.0–10.5)
nRBC: 0 % (ref 0.0–0.2)

## 2022-04-27 LAB — LIPASE, BLOOD: Lipase: 22 U/L (ref 11–51)

## 2022-04-27 LAB — MAGNESIUM: Magnesium: 2.4 mg/dL (ref 1.7–2.4)

## 2022-04-27 LAB — TROPONIN I (HIGH SENSITIVITY): Troponin I (High Sensitivity): 60 ng/L — ABNORMAL HIGH (ref <18)

## 2022-04-27 NOTE — ED Triage Notes (Signed)
The pt arrived by [facility] after c/o abd pain  and chest pain for one hour prior to arrival here she was given zofran by ems  iv and aspirin '324mg'$  po and a sl nitro she is a dialysis pt  that was dialkyzed Friday.

## 2022-04-27 NOTE — ED Notes (Signed)
Pt family brought pt to counter and asked for IV to be removed. Pt's family member stated that they would be taking pt home. This writer removed IV and pt left the lobby.

## 2022-04-27 NOTE — ED Provider Triage Note (Signed)
Emergency Medicine Provider Triage Evaluation Note  Robin [name] , a 71 y.o. female  was evaluated in triage.  Pt complains of upper abdominal pain and vomiting starting today.  Patient has a history of CHF, CAD, pancreatitis.  She has a history of ESRD and last had dialysis 2 days ago.  Transported by EMS.  Review of Systems  Positive: Abdominal pain, vomiting Negative: Fever  Physical Exam  BP (!) 180/93 (BP Location: Right Arm)   Pulse 74   Temp 97.6 F (36.4 C)   Resp 20   SpO2 97%  Gen:   Awake, actively heaving and spitting up thick sputum in the exam room Resp:  Normal effort  MSK:   Moves extremities without difficulty  Other:  Left upper extremity fistula with palpable thrill, abdomen soft, tenderness epigastrium, no tenderness lower abdomen  Medical Decision Making  Medically screening exam initiated at 5:36 PM.  Appropriate orders placed.  DAVEIGH BATTY was informed that the remainder of the evaluation will be completed by another provider, this initial triage assessment does not replace that evaluation, and the importance of remaining in the ED until their evaluation is complete.     Carlisle Cater, PA-C 04/27/22 1738

## 2022-04-29 ENCOUNTER — Telehealth (HOSPITAL_COMMUNITY): Payer: Self-pay | Admitting: *Deleted

## 2022-04-29 NOTE — Telephone Encounter (Signed)
Received fax from Dr Santiago Bumpers requesting angiogram for decreasing flows of L AVF.  Will give to [facility] to schedule.

## 2022-05-01 NOTE — Telephone Encounter (Signed)
Spoke with patient regarding scheduling fistulogram. Patient declined to schedule, stating she has already had this completed at the [facility] within the past 2 weeks. She denied having any problems with her access at dialysis and will talk with the dialysis center.

## 2022-07-29 ENCOUNTER — Observation Stay (HOSPITAL_COMMUNITY): Payer: No Typology Code available for payment source

## 2022-07-29 ENCOUNTER — Other Ambulatory Visit: Payer: Self-pay | Admitting: Student

## 2022-07-29 ENCOUNTER — Inpatient Hospital Stay (HOSPITAL_COMMUNITY)
Admission: EM | Admit: 2022-07-29 | Discharge: 2022-08-05 | DRG: 280 | Disposition: A | Discharge status: Home Health | Payer: No Typology Code available for payment source | Attending: Internal Medicine | Admitting: Internal Medicine

## 2022-07-29 ENCOUNTER — Encounter (HOSPITAL_COMMUNITY): Payer: Self-pay

## 2022-07-29 ENCOUNTER — Emergency Department (HOSPITAL_COMMUNITY): Payer: No Typology Code available for payment source

## 2022-07-29 ENCOUNTER — Other Ambulatory Visit: Payer: Self-pay

## 2022-07-29 DIAGNOSIS — I152 Hypertension secondary to endocrine disorders: Secondary | ICD-10-CM | POA: Diagnosis present

## 2022-07-29 DIAGNOSIS — R7989 Other specified abnormal findings of blood chemistry: Secondary | ICD-10-CM | POA: Diagnosis not present

## 2022-07-29 DIAGNOSIS — I480 Paroxysmal atrial fibrillation: Secondary | ICD-10-CM | POA: Diagnosis present

## 2022-07-29 DIAGNOSIS — Z7901 Long term (current) use of anticoagulants: Secondary | ICD-10-CM

## 2022-07-29 DIAGNOSIS — I214 Non-ST elevation (NSTEMI) myocardial infarction: Principal | ICD-10-CM | POA: Diagnosis present

## 2022-07-29 DIAGNOSIS — E1151 Type 2 diabetes mellitus with diabetic peripheral angiopathy without gangrene: Secondary | ICD-10-CM | POA: Diagnosis present

## 2022-07-29 DIAGNOSIS — I509 Heart failure, unspecified: Secondary | ICD-10-CM | POA: Diagnosis present

## 2022-07-29 DIAGNOSIS — E119 Type 2 diabetes mellitus without complications: Secondary | ICD-10-CM

## 2022-07-29 DIAGNOSIS — G4733 Obstructive sleep apnea (adult) (pediatric): Secondary | ICD-10-CM | POA: Diagnosis present

## 2022-07-29 DIAGNOSIS — R079 Chest pain, unspecified: Secondary | ICD-10-CM | POA: Diagnosis present

## 2022-07-29 DIAGNOSIS — I959 Hypotension, unspecified: Secondary | ICD-10-CM | POA: Diagnosis not present

## 2022-07-29 DIAGNOSIS — E1169 Type 2 diabetes mellitus with other specified complication: Secondary | ICD-10-CM | POA: Diagnosis present

## 2022-07-29 DIAGNOSIS — G8929 Other chronic pain: Secondary | ICD-10-CM | POA: Diagnosis present

## 2022-07-29 DIAGNOSIS — Z9181 History of falling: Secondary | ICD-10-CM

## 2022-07-29 DIAGNOSIS — R7889 Finding of other specified substances, not normally found in blood: Secondary | ICD-10-CM

## 2022-07-29 DIAGNOSIS — Z8249 Family history of ischemic heart disease and other diseases of the circulatory system: Secondary | ICD-10-CM

## 2022-07-29 DIAGNOSIS — Z86718 Personal history of other venous thrombosis and embolism: Secondary | ICD-10-CM

## 2022-07-29 DIAGNOSIS — N186 End stage renal disease: Secondary | ICD-10-CM | POA: Diagnosis not present

## 2022-07-29 DIAGNOSIS — Z8674 Personal history of sudden cardiac arrest: Secondary | ICD-10-CM

## 2022-07-29 DIAGNOSIS — Z794 Long term (current) use of insulin: Secondary | ICD-10-CM

## 2022-07-29 DIAGNOSIS — R112 Nausea with vomiting, unspecified: Secondary | ICD-10-CM | POA: Diagnosis present

## 2022-07-29 DIAGNOSIS — I70203 Unspecified atherosclerosis of native arteries of extremities, bilateral legs: Secondary | ICD-10-CM | POA: Diagnosis present

## 2022-07-29 DIAGNOSIS — F319 Bipolar disorder, unspecified: Secondary | ICD-10-CM | POA: Diagnosis present

## 2022-07-29 DIAGNOSIS — Z89412 Acquired absence of left great toe: Secondary | ICD-10-CM

## 2022-07-29 DIAGNOSIS — F17210 Nicotine dependence, cigarettes, uncomplicated: Secondary | ICD-10-CM | POA: Diagnosis present

## 2022-07-29 DIAGNOSIS — E11649 Type 2 diabetes mellitus with hypoglycemia without coma: Secondary | ICD-10-CM | POA: Diagnosis present

## 2022-07-29 DIAGNOSIS — Z955 Presence of coronary angioplasty implant and graft: Secondary | ICD-10-CM

## 2022-07-29 DIAGNOSIS — Z9049 Acquired absence of other specified parts of digestive tract: Secondary | ICD-10-CM

## 2022-07-29 DIAGNOSIS — M898X9 Other specified disorders of bone, unspecified site: Secondary | ICD-10-CM | POA: Diagnosis present

## 2022-07-29 DIAGNOSIS — E1122 Type 2 diabetes mellitus with diabetic chronic kidney disease: Secondary | ICD-10-CM | POA: Diagnosis present

## 2022-07-29 DIAGNOSIS — K219 Gastro-esophageal reflux disease without esophagitis: Secondary | ICD-10-CM | POA: Diagnosis present

## 2022-07-29 DIAGNOSIS — Z833 Family history of diabetes mellitus: Secondary | ICD-10-CM

## 2022-07-29 DIAGNOSIS — I132 Hypertensive heart and chronic kidney disease with heart failure and with stage 5 chronic kidney disease, or end stage renal disease: Secondary | ICD-10-CM | POA: Diagnosis present

## 2022-07-29 DIAGNOSIS — I5032 Chronic diastolic (congestive) heart failure: Secondary | ICD-10-CM | POA: Diagnosis present

## 2022-07-29 DIAGNOSIS — I491 Atrial premature depolarization: Secondary | ICD-10-CM | POA: Diagnosis present

## 2022-07-29 DIAGNOSIS — Z79899 Other long term (current) drug therapy: Secondary | ICD-10-CM

## 2022-07-29 DIAGNOSIS — I25119 Atherosclerotic heart disease of native coronary artery with unspecified angina pectoris: Secondary | ICD-10-CM

## 2022-07-29 DIAGNOSIS — M545 Low back pain, unspecified: Secondary | ICD-10-CM | POA: Diagnosis present

## 2022-07-29 DIAGNOSIS — Z823 Family history of stroke: Secondary | ICD-10-CM

## 2022-07-29 DIAGNOSIS — Z8701 Personal history of pneumonia (recurrent): Secondary | ICD-10-CM

## 2022-07-29 DIAGNOSIS — Z89422 Acquired absence of other left toe(s): Secondary | ICD-10-CM

## 2022-07-29 DIAGNOSIS — Z95820 Peripheral vascular angioplasty status with implants and grafts: Secondary | ICD-10-CM

## 2022-07-29 DIAGNOSIS — R0789 Other chest pain: Secondary | ICD-10-CM

## 2022-07-29 DIAGNOSIS — J449 Chronic obstructive pulmonary disease, unspecified: Secondary | ICD-10-CM | POA: Diagnosis present

## 2022-07-29 DIAGNOSIS — Z89431 Acquired absence of right foot: Secondary | ICD-10-CM

## 2022-07-29 DIAGNOSIS — I5042 Chronic combined systolic (congestive) and diastolic (congestive) heart failure: Secondary | ICD-10-CM | POA: Diagnosis present

## 2022-07-29 DIAGNOSIS — Z6833 Body mass index (BMI) 33.0-33.9, adult: Secondary | ICD-10-CM

## 2022-07-29 DIAGNOSIS — D631 Anemia in chronic kidney disease: Secondary | ICD-10-CM | POA: Diagnosis present

## 2022-07-29 DIAGNOSIS — F0393 Unspecified dementia, unspecified severity, with mood disturbance: Secondary | ICD-10-CM | POA: Diagnosis present

## 2022-07-29 DIAGNOSIS — Z86711 Personal history of pulmonary embolism: Secondary | ICD-10-CM

## 2022-07-29 DIAGNOSIS — E440 Moderate protein-calorie malnutrition: Secondary | ICD-10-CM | POA: Diagnosis present

## 2022-07-29 DIAGNOSIS — F250 Schizoaffective disorder, bipolar type: Secondary | ICD-10-CM | POA: Diagnosis present

## 2022-07-29 DIAGNOSIS — I25110 Atherosclerotic heart disease of native coronary artery with unstable angina pectoris: Secondary | ICD-10-CM | POA: Diagnosis present

## 2022-07-29 DIAGNOSIS — E669 Obesity, unspecified: Secondary | ICD-10-CM | POA: Diagnosis present

## 2022-07-29 DIAGNOSIS — E1140 Type 2 diabetes mellitus with diabetic neuropathy, unspecified: Secondary | ICD-10-CM | POA: Diagnosis present

## 2022-07-29 DIAGNOSIS — E785 Hyperlipidemia, unspecified: Secondary | ICD-10-CM | POA: Diagnosis present

## 2022-07-29 DIAGNOSIS — Z992 Dependence on renal dialysis: Secondary | ICD-10-CM

## 2022-07-29 DIAGNOSIS — Z7902 Long term (current) use of antithrombotics/antiplatelets: Secondary | ICD-10-CM

## 2022-07-29 LAB — COMPREHENSIVE METABOLIC PANEL
ALT: 19 U/L (ref 0–44)
AST: 31 U/L (ref 15–41)
Albumin: 3.1 g/dL — ABNORMAL LOW (ref 3.5–5.0)
Alkaline Phosphatase: 210 U/L — ABNORMAL HIGH (ref 38–126)
Anion gap: 15 (ref 5–15)
BUN: 25 mg/dL — ABNORMAL HIGH (ref 8–23)
CO2: 25 mmol/L (ref 22–32)
Calcium: 9.4 mg/dL (ref 8.9–10.3)
Chloride: 96 mmol/L — ABNORMAL LOW (ref 98–111)
Creatinine, Ser: 4.06 mg/dL — ABNORMAL HIGH (ref 0.44–1.00)
GFR, Estimated: 11 mL/min — ABNORMAL LOW (ref >60)
Glucose, Bld: 171 mg/dL — ABNORMAL HIGH (ref 70–99)
Potassium: 3.7 mmol/L (ref 3.5–5.1)
Sodium: 136 mmol/L (ref 135–145)
Total Bilirubin: 0.7 mg/dL (ref 0.3–1.2)
Total Protein: 7 g/dL (ref 6.5–8.1)

## 2022-07-29 LAB — HEPATITIS B SURFACE ANTIGEN: Hepatitis B Surface Ag: NONREACTIVE

## 2022-07-29 LAB — CBC WITH DIFFERENTIAL/PLATELET
Abs Immature Granulocytes: 0.01 10*3/uL (ref 0.00–0.07)
Basophils Absolute: 0.1 10*3/uL (ref 0.0–0.1)
Basophils Relative: 1 %
Eosinophils Absolute: 0.1 10*3/uL (ref 0.0–0.5)
Eosinophils Relative: 2 %
HCT: 37.5 % (ref 36.0–46.0)
Hemoglobin: 11.9 g/dL — ABNORMAL LOW (ref 12.0–15.0)
Immature Granulocytes: 0 %
Lymphocytes Relative: 18 %
Lymphs Abs: 1.1 10*3/uL (ref 0.7–4.0)
MCH: 30.1 pg (ref 26.0–34.0)
MCHC: 31.7 g/dL (ref 30.0–36.0)
MCV: 94.9 fL (ref 80.0–100.0)
Monocytes Absolute: 0.7 10*3/uL (ref 0.1–1.0)
Monocytes Relative: 12 %
Neutro Abs: 4 10*3/uL (ref 1.7–7.7)
Neutrophils Relative %: 67 %
Platelets: 184 10*3/uL (ref 150–400)
RBC: 3.95 MIL/uL (ref 3.87–5.11)
RDW: 19.8 % — ABNORMAL HIGH (ref 11.5–15.5)
WBC: 6 10*3/uL (ref 4.0–10.5)
nRBC: 0 % (ref 0.0–0.2)

## 2022-07-29 LAB — ECHOCARDIOGRAM COMPLETE
Area-P 1/2: 3.65 cm2
Calc EF: 32.2 %
Height: 64 in
S' Lateral: 3.4 cm
Single Plane A2C EF: 36.8 %
Single Plane A4C EF: 28.7 %
Weight: 2222.24 oz

## 2022-07-29 LAB — GLUCOSE, CAPILLARY
Glucose-Capillary: 138 mg/dL — ABNORMAL HIGH (ref 70–99)
Glucose-Capillary: 155 mg/dL — ABNORMAL HIGH (ref 70–99)
Glucose-Capillary: 95 mg/dL (ref 70–99)
Glucose-Capillary: 157 mg/dL — ABNORMAL HIGH (ref 70–99)

## 2022-07-29 LAB — TROPONIN I (HIGH SENSITIVITY)
Troponin I (High Sensitivity): 200 ng/L (ref <18)
Troponin I (High Sensitivity): 167 ng/L (ref <18)

## 2022-07-29 LAB — ETHANOL: Alcohol, Ethyl (B): <10 mg/dL (ref <10)

## 2022-07-29 LAB — PHOSPHORUS: Phosphorus: 4.9 mg/dL — ABNORMAL HIGH (ref 2.5–4.6)

## 2022-07-29 LAB — MAGNESIUM: Magnesium: 2.3 mg/dL (ref 1.7–2.4)

## 2022-07-29 LAB — LIPASE, BLOOD: Lipase: 52 U/L — ABNORMAL HIGH (ref 11–51)

## 2022-07-29 LAB — BRAIN NATRIURETIC PEPTIDE: B Natriuretic Peptide: 1147.2 pg/mL — ABNORMAL HIGH (ref 0.0–100.0)

## 2022-07-29 LAB — HIV ANTIBODY (ROUTINE TESTING W REFLEX): HIV Screen 4th Generation wRfx: NONREACTIVE

## 2022-07-29 MED ORDER — INSULIN DETEMIR 100 UNIT/ML ~~LOC~~ SOLN
15.0000 [IU] | Freq: Every day | SUBCUTANEOUS | Status: DC
  Administered 2022-07-29: 15 [IU] via SUBCUTANEOUS
  Filled 2022-07-29 (×2): qty 0.15

## 2022-07-29 MED ORDER — ISOSORBIDE MONONITRATE ER 60 MG PO TB24: 60.0000 mg | ORAL_TABLET | Freq: Every day | ORAL | Status: DC

## 2022-07-29 MED ORDER — ISOSORBIDE MONONITRATE ER 30 MG PO TB24: 30.0000 mg | ORAL_TABLET | Freq: Every day | ORAL | Status: DC

## 2022-07-29 MED ORDER — APIXABAN 2.5 MG PO TABS
2.5000 mg | ORAL_TABLET | Freq: Two times a day (BID) | ORAL | Status: DC
  Administered 2022-07-29: 2.5 mg via ORAL
  Filled 2022-07-29: qty 1

## 2022-07-29 MED ORDER — NITROGLYCERIN 0.4 MG SL SUBL
0.4000 mg | SUBLINGUAL_TABLET | SUBLINGUAL | Status: DC | PRN
  Administered 2022-07-31 (×2): 0.4 mg via SUBLINGUAL
  Filled 2022-07-29: qty 1

## 2022-07-29 MED ORDER — FERRIC CITRATE 1 GM 210 MG(FE) PO TABS
420.0000 mg | ORAL_TABLET | Freq: Two times a day (BID) | ORAL | Status: DC
  Administered 2022-07-29 – 2022-08-03 (×8): 420 mg via ORAL
  Filled 2022-07-29 (×13): qty 2

## 2022-07-29 MED ORDER — LIDOCAINE 5 % EX PTCH
1.0000 | MEDICATED_PATCH | CUTANEOUS | Status: DC
  Administered 2022-07-29 – 2022-08-05 (×8): 1 via TRANSDERMAL
  Filled 2022-07-29 (×8): qty 1

## 2022-07-29 MED ORDER — ARIPIPRAZOLE 2 MG PO TABS: 12.0000 mg | ORAL_TABLET | Freq: Every day | ORAL | Status: DC

## 2022-07-29 MED ORDER — PANTOPRAZOLE SODIUM 40 MG PO TBEC
40.0000 mg | DELAYED_RELEASE_TABLET | Freq: Two times a day (BID) | ORAL | Status: DC
  Administered 2022-07-29 – 2022-08-05 (×13): 40 mg via ORAL
  Filled 2022-07-29 (×13): qty 1

## 2022-07-29 MED ORDER — ISOSORBIDE MONONITRATE ER 30 MG PO TB24
15.0000 mg | ORAL_TABLET | Freq: Every day | ORAL | Status: DC
  Administered 2022-07-29: 15 mg via ORAL
  Filled 2022-07-29 (×2): qty 1

## 2022-07-29 MED ORDER — ACETAMINOPHEN 500 MG PO TABS
1000.0000 mg | ORAL_TABLET | Freq: Three times a day (TID) | ORAL | Status: DC | PRN
  Administered 2022-07-29 – 2022-08-01 (×3): 1000 mg via ORAL
  Filled 2022-07-29 (×3): qty 2

## 2022-07-29 MED ORDER — HEPARIN (PORCINE) 25000 UT/250ML-% IV SOLN
850.0000 [IU]/h | INTRAVENOUS | Status: DC
  Administered 2022-07-29: 900 [IU]/h via INTRAVENOUS
  Administered 2022-07-31: 850 [IU]/h via INTRAVENOUS
  Filled 2022-07-29 (×2): qty 250

## 2022-07-29 MED ORDER — ONDANSETRON HCL 4 MG/2ML IJ SOLN
4.0000 mg | Freq: Once | INTRAMUSCULAR | Status: DC
  Filled 2022-07-29: qty 2

## 2022-07-29 MED ORDER — ASPIRIN 81 MG PO CHEW
324.0000 mg | CHEWABLE_TABLET | Freq: Once | ORAL | Status: AC
  Administered 2022-07-29: 324 mg via ORAL
  Filled 2022-07-29: qty 4

## 2022-07-29 MED ORDER — CHLORHEXIDINE GLUCONATE CLOTH 2 % EX PADS
6.0000 | MEDICATED_PAD | Freq: Every day | CUTANEOUS | Status: DC
  Administered 2022-07-30 – 2022-07-31 (×2): 6 via TOPICAL

## 2022-07-29 MED ORDER — INSULIN DETEMIR 100 UNIT/ML ~~LOC~~ SOLN
10.0000 [IU] | Freq: Every day | SUBCUTANEOUS | Status: DC
  Filled 2022-07-29: qty 0.1

## 2022-07-29 MED ORDER — UMECLIDINIUM BROMIDE 62.5 MCG/ACT IN AEPB
2.0000 | INHALATION_SPRAY | Freq: Every morning | RESPIRATORY_TRACT | Status: DC
  Administered 2022-08-01 – 2022-08-05 (×5): 2 via RESPIRATORY_TRACT
  Filled 2022-07-29 (×2): qty 7

## 2022-07-29 MED ORDER — INSULIN ASPART 100 UNIT/ML IJ SOLN
0.0000 [IU] | Freq: Three times a day (TID) | INTRAMUSCULAR | Status: DC
  Administered 2022-07-29 – 2022-07-30 (×2): 1 [IU] via SUBCUTANEOUS
  Administered 2022-08-01: 4 [IU] via SUBCUTANEOUS
  Administered 2022-08-01: 2 [IU] via SUBCUTANEOUS
  Administered 2022-08-02: 3 [IU] via SUBCUTANEOUS
  Administered 2022-08-02 (×2): 1 [IU] via SUBCUTANEOUS
  Administered 2022-08-03: 3 [IU] via SUBCUTANEOUS
  Administered 2022-08-03: 4 [IU] via SUBCUTANEOUS
  Administered 2022-08-03: 3 [IU] via SUBCUTANEOUS

## 2022-07-29 MED ORDER — ONDANSETRON HCL 4 MG/2ML IJ SOLN
4.0000 mg | Freq: Once | INTRAMUSCULAR | Status: AC
  Administered 2022-07-29: 4 mg via INTRAVENOUS

## 2022-07-29 MED ORDER — ATORVASTATIN CALCIUM 40 MG PO TABS
40.0000 mg | ORAL_TABLET | Freq: Every day | ORAL | Status: DC
  Administered 2022-07-29 – 2022-07-30 (×2): 40 mg via ORAL
  Filled 2022-07-29 (×2): qty 1

## 2022-07-29 MED ORDER — AMLODIPINE BESYLATE 2.5 MG PO TABS
2.5000 mg | ORAL_TABLET | Freq: Every day | ORAL | Status: DC
  Administered 2022-07-29 – 2022-07-31 (×3): 2.5 mg via ORAL
  Filled 2022-07-29 (×3): qty 1

## 2022-07-29 MED ORDER — LISINOPRIL 5 MG PO TABS
2.5000 mg | ORAL_TABLET | Freq: Every day | ORAL | Status: DC
  Administered 2022-07-29: 2.5 mg via ORAL
  Filled 2022-07-29: qty 1

## 2022-07-29 MED ORDER — ARIPIPRAZOLE 2 MG PO TABS
12.0000 mg | ORAL_TABLET | Freq: Every day | ORAL | Status: DC
  Administered 2022-07-29 – 2022-08-04 (×7): 12 mg via ORAL
  Filled 2022-07-29 (×8): qty 1

## 2022-07-29 MED ORDER — METOPROLOL SUCCINATE ER 25 MG PO TB24
12.5000 mg | ORAL_TABLET | Freq: Every day | ORAL | Status: DC
  Administered 2022-07-29 – 2022-07-30 (×2): 12.5 mg via ORAL
  Filled 2022-07-29 (×2): qty 1

## 2022-07-29 MED ORDER — CLOPIDOGREL BISULFATE 75 MG PO TABS
75.0000 mg | ORAL_TABLET | Freq: Every day | ORAL | Status: DC
  Administered 2022-07-30 – 2022-08-05 (×7): 75 mg via ORAL
  Filled 2022-07-29 (×7): qty 1

## 2022-07-29 MED ORDER — ALBUTEROL SULFATE (2.5 MG/3ML) 0.083% IN NEBU: 3.0000 mL | INHALATION_SOLUTION | Freq: Four times a day (QID) | RESPIRATORY_TRACT | Status: DC | PRN

## 2022-07-29 NOTE — Consult Note (Addendum)
Cardiology Consultation   [name] MRN: [id number]; DOB: 10-21-1950  Admit date: 07/29/2022 Date of Consult: 07/29/2022  PCP:  [facility] Cardiologist:  Shelva Majestic, MD        Patient Profile:   Robin [name] is a 72 y.o. female with a hx of history of ESRD on dialysis, paroxysmal A-fib, PE/DVT, CAD s/p PCI, lower extremity necrotizing infctions s/p amputations, combined systolic and diastolic heart failure, dementia, bipolar, COPD, T2DM, HTN, and HLD  who is being seen 07/29/2022 for the evaluation of on going chest pressure x 1 week at the request of Dr. Saverio Danker.     History of Present Illness:   Robin [name] reports chest pressure and vague left sided neck pain x 1 week and is accompanied by mild SOB.  States that the symptoms often arise early in the morning or late at night when she is moving, but also the symptoms come on throughout the day regardless of what she is doing.  She describes this as chest heaviness rather than pain and denies any radiation to the left shoulder, or any other accompanying symptoms such as: nausea, numbness or tingling, or diaphoresis.  She also states that the symptoms are very different than what she felt when she had her prior MI.  Symptoms are still ongoing without improvement. She lives at home with her grandson but her daughter Judson Roch is her POA and dispenses her medications and looks after her medically.  Previously she was prescribed nitroglycerin and found relief with that, but has not taken that over a year ago.  She now has Imdur but does not recall whether or not she takes that as her daughter generally gives her medications. She is a past smoker (25 pack year) but recently quit about a year ago. She is followed by the [facility] and sees them regularly, but unable to access any of their notes.   A year ago she was in the [facility] from fall of 2022 to spring of [id number] with complicated lower extremity  necrotizing infections requiring amputation. The hospitalization was complicated by an arrest (unclear if cardiac or pulmonary) requiring two bouts of CPR and transferring her to the ICU.   ED workup: EKG showed prominent T wave inversions in the inferior leads and QRS changes in V2/V3.  No ST wave depressions or elevations were noted for STEMI.  Troponins were elevated at 200 and down trended to 167 on repeat.  She was placed on heparin drip and aspirin for the treatment of a NSTEMI.  Chest x-ray was obtained and did not show any evidence of acute cardiopulmonary disease. It showed mild cardiomegaly and aortic atherosclerosis. BNP 1,147, Cr 4.06  Chart review: She has extensive coronary artery and vascular disease. Her last heart catheterization in 2019 showed mild nonobstructive disease in the proximal and mid LAD.  She had a heavily calcified proximal circumflex that did not seem flow-limiting and was not considered for PCI due to heavy calcification and involvement of the intermediate branch stent that extended into the circumflex.  As such she was recommended for medical management.  Last echo was in November 2020 which showed a ejection fraction of 50 to 55%.  There is moderately increased LVH with grade 2 diastolic dysfunction but no regional wall motion abnormalities.  Past notes state that she has been adherent to her Eliquis dosing for previous history of PE and DVT. She also has bilateral lower  extremity arterial disease, s/p LLE SFA stent 2023. Recent hospitalization last year for infectious/ischemic disease requiring right TMA and left 1st two toe amputation.      Past Medical History:  Diagnosis Date   Acute delirium 11/18/2014   Acute encephalopathy    Acute on chronic respiratory failure with hypoxia (HCC) 09/09/2016   Acute respiratory failure with hypoxia (HCC) 11/29/2015   Anemia in chronic kidney disease 12/09/2014   Anxiety    Arthritis    Benign hypertension    Bipolar affective  disorder ([place]) 12/09/2014   CAD (coronary artery disease), native coronary artery with 2 stents  11/17/2014   Charcot foot due to diabetes mellitus ([place])    Chronic combined systolic and diastolic CHF (congestive heart failure) ([place]) 11/18/2014   Closed left ankle fracture 11/17/2014   Confusion 01/21/2015   Depression    Diabetes mellitus without complication (HCC)    End stage renal disease on dialysis ([place])    Fracture dislocation of ankle 11/17/2014   GERD (gastroesophageal reflux disease)    Heart murmur    History of blood transfusion    History of bronchitis    History of pneumonia    Hyperlipidemia 03/26/2015   Hypertension associated with diabetes ([place]) 11/18/2014   Hypertensive heart/renal disease with failure ([place]) 12/09/2014   Hypokalemia 11/17/2014   Multiple falls 01/21/2015   Obesity 11/17/2014   Onychomycosis 10/07/2016   Right leg DVT ([place]) 12/05/2015   SIRS (systemic inflammatory response syndrome) ([place]) 08/08/2016   Sleep apnea    Unstable angina ([place]) 12/26/2017   Ventricular tachycardia ([place])     Past Surgical History:  Procedure Laterality Date   A/V FISTULAGRAM N/A 01/03/2020   Procedure: A/V FISTULAGRAM;  Surgeon: Serafina Mitchell, MD;  Location: [facility];  Service: Cardiovascular;  Laterality: N/A;   A/V FISTULAGRAM Left 03/12/2021   Procedure: A/V FISTULAGRAM;  Surgeon: Serafina Mitchell, MD;  Location: [facility];  Service: Cardiovascular;  Laterality: Left;   ABDOMINAL HYSTERECTOMY     ANGIOPLASTY Left 01/26/2020   Procedure: LEFT ARM GRAPH THROMBECTOMY WITH BALLON ANGIOPLASTY;  Surgeon: Waynetta Sandy, MD;  Location: [facility];  Service: Vascular;  Laterality: Left;   ANKLE CLOSED REDUCTION N/A 11/17/2014   Procedure: CLOSED REDUCTION ANKLE;  Surgeon: Earlie Server, MD;  Location: [facility];  Service: Orthopedics;  Laterality: N/A;   ANKLE FUSION Left 03/16/2015   Procedure: Left Tibiocalcaneal Fusion;  Surgeon: Newt Minion, MD;  Location: [facility];  Service: Orthopedics;  Laterality: Left;   APPLICATION OF WOUND VAC Right 08/06/2016   Procedure: APPLICATION OF PREVENA WOUND VAC;  Surgeon: Newt Minion, MD;  Location: [facility];  Service: Orthopedics;  Laterality: Right;   AV FISTULA PLACEMENT Left [id number]   done at [facility]     2 stent    CHOLECYSTECTOMY     CORONARY STENT PLACEMENT     FISTULOGRAM Left 01/26/2020   Procedure: FISTULOGRAM;  Surgeon: Waynetta Sandy, MD;  Location: [facility];  Service: Vascular;  Laterality: Left;   FOOT ARTHRODESIS Right 08/06/2016   Procedure: Right Foot Fusion Lisfranc Joint;  Surgeon: Newt Minion, MD;  Location: [facility];  Service: Orthopedics;  Laterality: Right;   HARDWARE REMOVAL Left 03/16/2015   Procedure: Removal Hardware Left Ankle;  Surgeon: Newt Minion, MD;  Location: [facility];  Service: Orthopedics;  Laterality: Left;   IR AV DIALY SHUNT INTRO NEEDLE/INTRACATH INITIAL W/PTA/IMG LEFT  01/05/2018  IR US GUIDE VASC ACCESS LEFT  01/05/2018   LEFT HEART CATH AND CORONARY ANGIOGRAPHY N/A 12/28/2017   Procedure: LEFT HEART CATH AND CORONARY ANGIOGRAPHY;  Surgeon: Burnell Blanks, MD;  Location: [facility];  Service: Cardiovascular;  Laterality: N/A;   LOWER EXTREMITY ANGIOGRAPHY N/A 03/21/2021   Procedure: LOWER EXTREMITY ANGIOGRAPHY;  Surgeon: Marty Heck, MD;  Location: [facility];  Service: Cardiovascular;  Laterality: N/A;   ORIF ANKLE FRACTURE Left 11/20/2014   Procedure: OPEN REDUCTION INTERNAL FIXATION (ORIF) ANKLE FRACTURE;  Surgeon: Renette Butters, MD;  Location: [facility];  Service: Orthopedics;  Laterality: Left;   PERIPHERAL VASCULAR BALLOON ANGIOPLASTY  01/03/2020   Procedure: PERIPHERAL VASCULAR BALLOON ANGIOPLASTY;  Surgeon: Serafina Mitchell, MD;  Location: [facility];  Service: Cardiovascular;;   PERIPHERAL VASCULAR BALLOON ANGIOPLASTY Left 03/12/2021   Procedure: PERIPHERAL VASCULAR BALLOON ANGIOPLASTY;  Surgeon:  Serafina Mitchell, MD;  Location: [facility];  Service: Cardiovascular;  Laterality: Left;   PERIPHERAL VASCULAR BALLOON ANGIOPLASTY  08/22/2021   Procedure: PERIPHERAL VASCULAR BALLOON ANGIOPLASTY;  Surgeon: Marty Heck, MD;  Location: [facility];  Service: Cardiovascular;;  Left AVF PTA   TONSILLECTOMY       Inpatient Medications: Scheduled Meds:  amLODipine  2.5 mg Oral Daily   ARIPiprazole  12 mg Oral QHS   atorvastatin  40 mg Oral QHS   ferric citrate  420 mg Oral BID WC   insulin aspart  0-6 Units Subcutaneous TID WC   insulin detemir  15 Units Subcutaneous QHS   isosorbide mononitrate  15 mg Oral Q breakfast   lidocaine  1 patch Transdermal Q24H   ondansetron (ZOFRAN) IV  4 mg Intravenous Once   pantoprazole  40 mg Oral BID AC   umeclidinium bromide  2 puff Inhalation q morning   Continuous Infusions:  heparin     PRN Meds: albuterol, nitroGLYCERIN  Allergies:   No Known Allergies  Social History:   Social History   Socioeconomic History   Marital status: Divorced    Spouse name: Not on file   Number of children: Not on file   Years of education: Not on file   Highest education level: Not on file  Occupational History   Not on file  Tobacco Use   Smoking status: Every Day    Packs/day: 1.00    Types: Cigarettes   Smokeless tobacco: Never  Vaping Use   Vaping Use: Never used  Substance and Sexual Activity   Alcohol use: No   Drug use: No   Sexual activity: Never  Other Topics Concern   Not on file  Social History Narrative   Not on file   Social Determinants of Health   Financial Resource Strain: Not on file  Food Insecurity: Not on file  Transportation Needs: Not on file  Physical Activity: Not on file  Stress: Not on file  Social Connections: Not on file  Intimate Partner Violence: Not on file    Family History:    Family History  Family history unknown: Yes     ROS:  Please see the history of present illness.   All other ROS reviewed and negative.     Physical Exam/Data:   Vitals:   07/29/22 0830 07/29/22 0931 07/29/22 1203 07/29/22 1613  BP: 130/76 131/68 115/65 (!) 149/80  Pulse: (!) 57 65 65 71  Resp: '12 16 17 16  '$ Temp:  98 F (36.7 C) 97.8  F (36.6 C) 98.1 F (36.7 C)  TempSrc:  Oral Oral Oral  SpO2: 97% 98% 97% 98%  Weight:      Height:        Intake/Output Summary (Last 24 hours) at 07/29/2022 1623 Last data filed at 07/29/2022 1605 Gross per 24 hour  Intake --  Output 1200 ml  Net -1200 ml      07/29/2022    4:22 AM 04/27/2022    5:45 PM 08/22/2021    7:35 AM  Last 3 Weights  Weight (lbs) 138 lb 14.2 oz 138 lb 14.2 oz 139 lb  Weight (kg) 63 kg 63 kg 63.05 kg     Body mass index is 23.84 kg/m.  General:  Well nourished, well developed, in no acute distress HEENT: normal Neck: no JVD Vascular: No carotid bruits; Distal pulses 2+ bilaterally Cardiac:  normal S1, S2; RRR; no murmur.  Lungs:  clear to auscultation bilaterally, no wheezing, rhonchi or rales  Abd: soft, nontender, no hepatomegaly  Ext: no edema Musculoskeletal:  No deformities, BUE and BLE strength normal and equal Skin: warm and dry  Neuro:  CNs 2-12 intact, no focal abnormalities noted Psych:  Normal affect   EKG:  The EKG was personally reviewed and demonstrates: Normal sinus rhythm at a rate of 70 bpm with frequent PACs.  Some presence of T wave inversion in the inferior leads.  Left axis deviation Telemetry:  Telemetry was personally reviewed and demonstrates:  NSR with frequent PACs rate 66  Relevant CV Studies: Echo 07/29/2022 Left Ventricle: Left ventricular ejection fraction, by estimation, is 45  to 50%. The left ventricle has mildly decreased function. The left  ventricle demonstrates global hypokinesis. The left ventricular internal  cavity size was normal in size. There is   mild concentric left ventricular hypertrophy. Left ventricular diastolic  parameters are consistent with Grade II  diastolic dysfunction  (pseudonormalization).   Right Ventricle: The right ventricular size is mildly enlarged. No  increase in right ventricular wall thickness. Right ventricular systolic  function is mildly reduced. There is mildly elevated pulmonary artery  systolic pressure. The tricuspid regurgitant   velocity is 2.55 m/s, and with an assumed right atrial pressure of 15  mmHg, the estimated right ventricular systolic pressure is [id number] mmHg.   Left Atrium: Left atrial size was normal in size.   Right Atrium: Right atrial size was normal in size.   Pericardium: There is no evidence of pericardial effusion.   Mitral Valve: The mitral valve is grossly normal. Trivial mitral valve  regurgitation. No evidence of mitral valve stenosis.   Tricuspid Valve: The tricuspid valve is grossly normal. Tricuspid valve  regurgitation is trivial. No evidence of tricuspid stenosis.   Aortic Valve: The aortic valve is tricuspid. Aortic valve regurgitation is  not visualized. Aortic valve sclerosis is present, with no evidence of  aortic valve stenosis.   Pulmonic Valve: The pulmonic valve was grossly normal. Pulmonic valve  regurgitation is trivial. No evidence of pulmonic stenosis.   Aorta: The aortic root and ascending aorta are structurally normal, with  no evidence of dilitation.   Venous: The inferior vena cava is dilated in size with less than 50%  respiratory variability, suggesting right atrial pressure of 15 mmHg.   IAS/Shunts: The atrial septum is grossly normal.   Laboratory Data:  High Sensitivity Troponin:   Recent Labs  Lab 07/29/22 0443 07/29/22 0658  TROPONINIHS 200* 167*     Chemistry Recent Labs  Lab 07/29/22 [phone]  07/29/22 1213  NA 136  --   K 3.7  --   CL 96*  --   CO2 25  --   GLUCOSE 171*  --   BUN 25*  --   CREATININE 4.06*  --   CALCIUM 9.4  --   MG  --  2.3  GFRNONAA 11*  --   ANIONGAP 15  --     Recent Labs  Lab 07/29/22 0443  PROT 7.0   ALBUMIN 3.1*  AST 31  ALT 19  ALKPHOS 210*  BILITOT 0.7   Lipids No results for input(s): "CHOL", "TRIG", "HDL", "LABVLDL", "LDLCALC", "CHOLHDL" in the last 168 hours.  Hematology Recent Labs  Lab 07/29/22 0443  WBC 6.0  RBC 3.95  HGB 11.9*  HCT 37.5  MCV 94.9  MCH 30.1  MCHC 31.7  RDW 19.8*  PLT 184   Thyroid No results for input(s): "TSH", "FREET4" in the last 168 hours.  BNP Recent Labs  Lab 07/29/22 0443  BNP 1,147.2*    DDimer No results for input(s): "DDIMER" in the last 168 hours.   Radiology/Studies:  ECHOCARDIOGRAM COMPLETE  Result Date: 07/29/2022    ECHOCARDIOGRAM REPORT   Patient Name:   Robin [name] Date of Exam: 07/29/2022 Medical Rec #:  [id number]    Height:       64.0 in Accession #:    [id number]   Weight:       138.9 lb Date of Birth:  Sep 04, 1950    BSA:          1.676 m Patient Age:    71 years     BP:           128/67 mmHg Patient Gender: F            HR:           71 bpm. Exam Location:  Inpatient Procedure: 2D Echo, Cardiac Doppler and Color Doppler Indications:    Chest Pain R07.9                 Elevated Troponin  History:        Patient has prior history of Echocardiogram examinations, most                 recent 04/15/2019. CHF, Arrythmias:Tachycardia,                 Signs/Symptoms:Murmur and Chest Pain; Risk Factors:Dyslipidemia,                 Sleep Apnea, Hypertension and Current Smoker.  Sonographer:    Greer Pickerel Referring Phys: [id number] ELIZABETH REES  Sonographer Comments: Image acquisition challenging due to respiratory motion. IMPRESSIONS  1. Left ventricular ejection fraction, by estimation, is 45 to 50%. The left ventricle has mildly decreased function. The left ventricle demonstrates global hypokinesis. There is mild concentric left ventricular hypertrophy. Left ventricular diastolic parameters are consistent with Grade II diastolic dysfunction (pseudonormalization).  2. Right ventricular systolic function is mildly reduced. The right ventricular  size is mildly enlarged. There is mildly elevated pulmonary artery systolic pressure. The estimated right ventricular systolic pressure is [id number] mmHg.  3. The mitral valve is grossly normal. Trivial mitral valve regurgitation. No evidence of mitral stenosis.  4. The aortic valve is tricuspid. Aortic valve regurgitation is not visualized. Aortic valve sclerosis is present, with no evidence of aortic valve stenosis.  5. The inferior vena cava is dilated in size with <50% respiratory variability, suggesting right atrial pressure  of 15 mmHg. FINDINGS  Left Ventricle: Left ventricular ejection fraction, by estimation, is 45 to 50%. The left ventricle has mildly decreased function. The left ventricle demonstrates global hypokinesis. The left ventricular internal cavity size was normal in size. There is  mild concentric left ventricular hypertrophy. Left ventricular diastolic parameters are consistent with Grade II diastolic dysfunction (pseudonormalization). Right Ventricle: The right ventricular size is mildly enlarged. No increase in right ventricular wall thickness. Right ventricular systolic function is mildly reduced. There is mildly elevated pulmonary artery systolic pressure. The tricuspid regurgitant  velocity is 2.55 m/s, and with an assumed right atrial pressure of 15 mmHg, the estimated right ventricular systolic pressure is [id number] mmHg. Left Atrium: Left atrial size was normal in size. Right Atrium: Right atrial size was normal in size. Pericardium: There is no evidence of pericardial effusion. Mitral Valve: The mitral valve is grossly normal. Trivial mitral valve regurgitation. No evidence of mitral valve stenosis. Tricuspid Valve: The tricuspid valve is grossly normal. Tricuspid valve regurgitation is trivial. No evidence of tricuspid stenosis. Aortic Valve: The aortic valve is tricuspid. Aortic valve regurgitation is not visualized. Aortic valve sclerosis is present, with no evidence of aortic valve stenosis.  Pulmonic Valve: The pulmonic valve was grossly normal. Pulmonic valve regurgitation is trivial. No evidence of pulmonic stenosis. Aorta: The aortic root and ascending aorta are structurally normal, with no evidence of dilitation. Venous: The inferior vena cava is dilated in size with less than 50% respiratory variability, suggesting right atrial pressure of 15 mmHg. IAS/Shunts: The atrial septum is grossly normal.  LEFT VENTRICLE PLAX 2D LVIDd:         3.90 cm      Diastology LVIDs:         3.40 cm      LV e' medial:    3.65 cm/s LV PW:         1.20 cm      LV E/e' medial:  27.7 LV IVS:        1.30 cm      LV e' lateral:   5.27 cm/s LVOT diam:     1.70 cm      LV E/e' lateral: 19.2 LV SV:         39 LV SV Index:   23 LVOT Area:     2.27 cm  LV Volumes (MOD) LV vol d, MOD A2C: 123.0 ml LV vol d, MOD A4C: 95.1 ml LV vol s, MOD A2C: 77.7 ml LV vol s, MOD A4C: 67.8 ml LV SV MOD A2C:     45.3 ml LV SV MOD A4C:     95.1 ml LV SV MOD BP:      34.7 ml RIGHT VENTRICLE RV S prime:     7.05 cm/s TAPSE (M-mode): 1.3 cm LEFT ATRIUM             Index        RIGHT ATRIUM           Index LA diam:        3.00 cm 1.79 cm/m   RA Area:     27.40 cm LA Vol (A2C):   43.1 ml 25.72 ml/m  RA Volume:   91.10 ml  54.37 ml/m LA Vol (A4C):   47.1 ml 28.11 ml/m LA Biplane Vol: 46.2 ml 27.57 ml/m  AORTIC VALVE             PULMONIC VALVE LVOT Vmax:   80.90 cm/s  PR End Diast Vel:  5.34 msec LVOT Vmean:  49.300 cm/s LVOT VTI:    0.171 m  AORTA Ao Root diam: 3.10 cm Ao Asc diam:  3.40 cm MITRAL VALVE                TRICUSPID VALVE MV Area (PHT): 3.65 cm     TR Peak grad:   26.0 mmHg MV Decel Time: 208 msec     TR Vmax:        255.00 cm/s MV E velocity: 101.00 cm/s MV A velocity: 87.90 cm/s   SHUNTS MV E/A ratio:  1.15         Systemic VTI:  0.17 m                             Systemic Diam: 1.70 cm Eleonore Chiquito MD Electronically signed by Eleonore Chiquito MD Signature Date/Time: 07/29/2022/1:28:42 PM    Final    CT Cervical Spine Wo  Contrast  Result Date: 07/29/2022 CLINICAL DATA:  72 year old female status post fall 2 weeks ago with persistent dizziness, nausea. Vomiting and decreased appetite. Dialysis patient. EXAM: CT CERVICAL SPINE WITHOUT CONTRAST TECHNIQUE: Multidetector CT imaging of the cervical spine was performed without intravenous contrast. Multiplanar CT image reconstructions were also generated. RADIATION DOSE REDUCTION: This exam was performed according to the departmental dose-optimization program which includes automated exposure control, adjustment of the mA and/or kV according to patient size and/or use of iterative reconstruction technique. COMPARISON:  Head CT today reported separately. Cervical spine CT 10/27/2020. FINDINGS: Alignment: Chronic anterolisthesis of C4 on C5. Chronic straightening of cervical lordosis. Less reversal compared to 2022. Cervicothoracic junction alignment is within normal limits. Bilateral posterior element alignment is within normal limits. Skull base and vertebrae: Stable bone mineralization. Visualized skull base is intact. No atlanto-occipital dissociation. C1 and C2 appear intact and aligned. Chronic endplate erosions [id number], C5-C6. Chronic C6 inferior endplate compression. Unilateral chronic C4-C5 facet erosion. Suspect chronic dialysis related spondyloarthropathy. No acute osseous abnormality identified. Soft tissues and spinal canal: No prevertebral fluid or swelling. No visible canal hematoma. Disc levels: Chronic degeneration C4-C5 through C6-C7. Mild multifactorial spinal stenosis suspected at C4-C5. Upper chest: Osteopenia. No acute osseous abnormality identified. Negative lung apices. Small volume retained gas and secretions in the upper thoracic esophagus. IMPRESSION: 1. No acute traumatic injury identified in the cervical spine. 2. 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 Mild multifactorial spinal stenosis suspected at C4-C5. Electronically Signed    By: Genevie Ann M.D.   On: 07/29/2022 06:03   CT Head Wo Contrast  Result Date: 07/29/2022 CLINICAL DATA:  72 year old female status post fall 2 weeks ago with persistent dizziness, nausea. Vomiting and decreased appetite. Dialysis patient. EXAM: CT HEAD WITHOUT CONTRAST TECHNIQUE: Contiguous axial images were obtained from the base of the skull through the vertex without intravenous contrast. RADIATION DOSE REDUCTION: This exam was performed according to the departmental dose-optimization program which includes automated exposure control, adjustment of the mA and/or kV according to patient size and/or use of iterative reconstruction technique. COMPARISON:  Head CT 10/27/2020.  Brain MRI 07/27/2020. FINDINGS: Brain: Stable cerebral volume. No midline shift, ventriculomegaly, mass effect, evidence of mass lesion, intracranial hemorrhage or evidence of cortically based acute infarction. Stable and normal for age gray-white differentiation. No convincing encephalomalacia. Vascular: Extensive Calcified atherosclerosis at the skull base. No suspicious intracranial vascular hyperdensity. Skull: No acute osseous abnormality identified. Sinuses/Orbits: Visualized paranasal sinuses and mastoids are stable and well aerated.  Other: Calcified scalp vessel atherosclerosis. No orbit or scalp soft tissue injury identified. IMPRESSION: 1. No recent traumatic injury identified. Stable and normal for age noncontrast CT appearance of the brain. 2. Advanced calcified atherosclerosis compatible with End stage renal disease. Electronically Signed   By: Genevie Ann M.D.   On: 07/29/2022 05:58   DG Chest Port 1 View  Result Date: 07/29/2022 CLINICAL DATA:  72 year old female with history of shortness of breath. EXAM: PORTABLE CHEST 1 VIEW COMPARISON:  Chest x-ray 04/27/2022. FINDINGS: Lung volumes are normal. No consolidative airspace disease. No pleural effusions. No pneumothorax. No evidence of pulmonary edema. Heart size is mildly  enlarged (unchanged). Upper mediastinal contours are within normal limits. Atherosclerotic calcifications are noted in the thoracic aorta. Vascular stent projecting over the left axillary region. IMPRESSION: 1. No radiographic evidence of acute cardiopulmonary disease. 2. Mild cardiomegaly. 3. Aortic atherosclerosis. Electronically Signed   By: Vinnie Langton M.D.   On: 07/29/2022 05:16     Assessment and Plan:   NSTEMI Patient with previous known history of non obstructing disease in LAD with heavily calcified circumflex which was being managed medically due to difficult PCI with calcification and prior stent in nearby area.  She is now presenting with unstable anginal symptoms with pain in her left neck for the past week with more prominent T wave inversions in the anteroseptal leads, elevated troponins that have been decreasing from 200 to 167 now, and has several comorbid conditions (diabetes, hypertension, hyperlipidemia, recent possible cardiac arrest) that put her at high risk for cardiac complications.  Reviewed 2019 cath films with Dr. Claiborne Billings who agreed circumflex would be difficult to intervene on will attempt medical therapy for now.  - Nitro as needed and if BP tolerates  - Scheduled imdur '15mg'$  for tonight  - Heparin dose to be started tonight and has been delayed for later tonight due to receiving eliquis this morning.  Received 324 mg of aspirin in the ED - continue atorvastatin '40mg'$   Combined systolic and diastolic CHF Prior echo was in November 2020 which showed a ejection fraction of 50 to 55%.  There was moderately increased LVH with grade 2 diastolic dysfunction but no regional wall motion abnormalities. Current echo showed slight diminished EF of 45 to 50%. The left ventricle has mildly decreased function. The left ventricle demonstrates global hypokinesis with same grade 2 diastolic dysfunction.  Patient appearing euvolemic and not showing signs of fluid overload. She has slight  decreased function but otherwise seems to be on proper medical therapy. GDMT may be difficult to implement given low BP and will favor antianginal medications for now.  - holding lisinopril and holding metoprolol due to low HR. - start low dose amlodipine 2.'5mg'$  and imdur '15mg'$  for anginal sxs   A-Fib Review of past EKGs and EKG on admission shows sinus rhythm with frequent PACs.  - Currently on Eliquis 2.5 mg BID reduced dose due to history of prior bleeding events  Chronic Anticoagulation  - on eliquis   HTN - continue lisinopril 2.5 daily, hold metoprolol with heart rate in 60's   HLD - continue atorvastatin '40mg'$    ESRD Type 2 diabetes mellitus Peripheral artery disease - managed by primary    Risk Assessment/Risk Scores:        :[id number]   TIMI Risk Score for Unstable Angina or Non-ST Elevation MI:   The patient's TIMI risk score is 6, which indicates a 41% risk of all cause mortality, new or recurrent myocardial infarction or  need for urgent revascularization in the next 14 days.    CHA2DS2-VASc Score = 6  :1} This indicates a 9.7% annual risk of stroke. The patient's score is based upon: CHF History: 1 HTN History: 1 Diabetes History: 1 Stroke History: 0 Vascular Disease History: 1 Age Score: 1 Gender Score: 1    For questions or updates, please contact [place] Please consult [email or web address] for contact info under    Signed, Bonnee Quin, PA-C  07/29/2022 4:23 PM     Patient seen and examined. Agree with assessment and plan.  Robin [name] is a 72 year old African-American female who is a former patient of Dr. Daneen Schick.  She is followed at the [facility].  She has a history of end-stage renal disease on dialysis, remote history of PE/DVT, known CAD and is post prior PCI with stenting of her ramus intermediate vessel, PVD hypertension, hyperlipidemia, type 2 diabetes mellitus, and COPD.  Her last cardiac catheterization in August 2019 showed  moderate LAD and diagonal stenosis, stent in the ostium of the ramus immediate vessel with 40% in-stent narrowing, and a heavily calcified ostial circumflex with stenosis probably greater than 65%.  Mild disease in her RCA.  She has been on medical therapy.  She is status post lower extremity necrotizing infections requiring partial amputation.  She has been on long-term anticoagulation.  She apparently developed chest discomfort last evening which at times was sharp at other times a pressure leading to her current admission.  Presently, she is pain-free.EchoDoppler study has shown an EF of 45 to 50%.  There was grade 2 diastolic dysfunction, mild elevation of RV systolic pressure at 41 mm.  On exam, HEENT is unremarkable.  JVD was 7 to 8 cm.  There was no wheezing or rales.  Rhythm was regular with faint systolic murmur.  Abdomen was nontender.  No significant lower extremity edema.  Troponins are mildly elevated at 200 > 167.  BUN 25, creatinine 4.06.  She is on dialysis on Monday Wednesday and Friday.  Hemoglobin 11.9 hematocrit 37.5.  BNP elevated at 1147 consistent with mild volume overload contributed by her end-stage renal disease.  Previously her heart rate had decreased her metoprolol has been held.  Presently, heart rate now is in the 70s to 80s with occasional to frequent PACs and blood pressure is 146/84.  ECG shows sinus rhythm at 63 with PACs.  There is early transition with more prominent T wave inversion in V2 V3 with anterior and lateral T wave mild abnormalities.  Will initially plan stabilization with medical therapy.  Will re-initiate low-dose metoprolol 12.5 mg, amlodipine 2.5 mg and low-dose isosorbide 15 mg for anti-ischemic benefit.  She has received Eliquis.  Recheck ECG in a.m.  Further recommendations depending upon clinical course   Robin Sine, MD, [facility] 07/29/2022 4:40 PM

## 2022-07-29 NOTE — H&P (Cosign Needed Addendum)
Date: 07/29/2022               Patient Name:  Robin [name] MRN: [id number]  DOB: 23-Aug-1950 Age / Sex: 72 y.o., female   PCP: [facility] [place] Service: Internal Medicine Teaching Service         Attending Physician: Dr. Charise Killian, MD      First Contact: Dr. Starlyn Skeans, MD Pager [phone]    Second Contact: Dr. Delene Ruffini, MD Pager [phone]         After Hours (After 5p/  First Contact Pager: [phone]  weekends / holidays): Second Contact Pager: [phone]   SUBJECTIVE   Chief Complaint: Chest pressure  History of Present Illness:   Robin [name] is a 72 y/o person living with a history of ESRD, dementia, CAD s/p PCI, lower extremity necrotizing ischemic/infectious s/p amputations, atrial fibrillation, combined heart failure, COPD, T2DM, HTN, and HLD who presents for a week of chest pressure.   She reports a heaviness in her chest for the past week, it radiates into her left neck. Denies radiation elsewhere. Does have some associated shortness of breath with these episodes. The pain occurs at rest and improved with tylenol. She denies this feeling like her prior MI which she describes as more painful. The pain is relived by playing cards and taking her mind off of the pain. She denies feeling ill recently with fever,chills, cough, abdominal pain, n/v/d. No pain or swelling in her legs. She went to dialysis yesterday and had no complications.   She endorses a fall last week after losing her footing and landing on her left side. She notes hitting her head but denies loss of consciousness. This was an unwitnessed fall. Per the patient's daughter, Robin [name] has someone with her everyday except for a 4 hour period. She questions if the patient truly fell. With her dementia and psychiatric history, she has had some hallucinations over the last few weeks saying things are crawling towards her. They have had a difficult time getting in with mental health at the [place] [facility] in  [place].   She had a recent prolonged hospital course a year ago, she was in the [facility] from fall of 2022 to spring of [id number] with complicated lower extremity necrotizing infections requiring amputation. The hospitalization was complicated by an arrest (unclear if cardiac or pulmonary) requiring two bouts of CPR and transferring her to the ICU. Patient's daughter notes that they did not believe it was cardiac etiology.  Meds:  Abilify 12 mg daily Lisinopril 2.5 mg Metoprolol 200 mg daily Atorvastatin 40 mg daily Eliquis 2.5 mg BID Auryxia 420 mg BID Novlog SSI Levemir 15 U daily Imdur 60 mg daily Pantoprazole 40 mg BID Spirivia 2 puffs daily  Past Medical History CAD s/p stent placement Ischemic/infectious lower extremity s/p right TMA and left 1st/2nd toe amputation Dementia Atrial fibrillation Combined systolic and diastolic heart failure ESRD  COPD OSA HTN HLD Insulin requiring T2DM  Past Surgical History:  Procedure Laterality Date   A/V FISTULAGRAM N/A 01/03/2020   Procedure: A/V FISTULAGRAM;  Surgeon: Serafina Mitchell, MD;  Location: [facility];  Service: Cardiovascular;  Laterality: N/A;   A/V FISTULAGRAM Left 03/12/2021   Procedure: A/V FISTULAGRAM;  Surgeon: Serafina Mitchell, MD;  Location: [facility];  Service: Cardiovascular;  Laterality: Left;   ABDOMINAL HYSTERECTOMY     ANGIOPLASTY Left 01/26/2020   Procedure: LEFT ARM GRAPH  THROMBECTOMY WITH BALLON ANGIOPLASTY;  Surgeon: Waynetta Sandy, MD;  Location: [facility];  Service: Vascular;  Laterality: Left;   ANKLE CLOSED REDUCTION N/A 11/17/2014   Procedure: CLOSED REDUCTION ANKLE;  Surgeon: Earlie Server, MD;  Location: [facility];  Service: Orthopedics;  Laterality: N/A;   ANKLE FUSION Left 03/16/2015   Procedure: Left Tibiocalcaneal Fusion;  Surgeon: Newt Minion, MD;  Location: [place];  Service: Orthopedics;  Laterality: Left;   APPLICATION OF WOUND VAC Right 08/06/2016   Procedure: APPLICATION  OF PREVENA WOUND VAC;  Surgeon: Newt Minion, MD;  Location: [facility];  Service: Orthopedics;  Laterality: Right;   AV FISTULA PLACEMENT Left [id number]   done at [place]     2 stent    CHOLECYSTECTOMY     CORONARY STENT PLACEMENT     FISTULOGRAM Left 01/26/2020   Procedure: FISTULOGRAM;  Surgeon: Waynetta Sandy, MD;  Location: [place];  Service: Vascular;  Laterality: Left;   FOOT ARTHRODESIS Right 08/06/2016   Procedure: Right Foot Fusion Lisfranc Joint;  Surgeon: Newt Minion, MD;  Location: [facility];  Service: Orthopedics;  Laterality: Right;   HARDWARE REMOVAL Left 03/16/2015   Procedure: Removal Hardware Left Ankle;  Surgeon: Newt Minion, MD;  Location: [facility];  Service: Orthopedics;  Laterality: Left;   IR AV DIALY SHUNT INTRO NEEDLE/INTRACATH INITIAL W/PTA/IMG LEFT  01/05/2018   IR US GUIDE VASC ACCESS LEFT  01/05/2018   LEFT HEART CATH AND CORONARY ANGIOGRAPHY N/A 12/28/2017   Procedure: LEFT HEART CATH AND CORONARY ANGIOGRAPHY;  Surgeon: Burnell Blanks, MD;  Location: [facility];  Service: Cardiovascular;  Laterality: N/A;   LOWER EXTREMITY ANGIOGRAPHY N/A 03/21/2021   Procedure: LOWER EXTREMITY ANGIOGRAPHY;  Surgeon: Marty Heck, MD;  Location: [facility];  Service: Cardiovascular;  Laterality: N/A;   ORIF ANKLE FRACTURE Left 11/20/2014   Procedure: OPEN REDUCTION INTERNAL FIXATION (ORIF) ANKLE FRACTURE;  Surgeon: Renette Butters, MD;  Location: [facility];  Service: Orthopedics;  Laterality: Left;   PERIPHERAL VASCULAR BALLOON ANGIOPLASTY  01/03/2020   Procedure: PERIPHERAL VASCULAR BALLOON ANGIOPLASTY;  Surgeon: Serafina Mitchell, MD;  Location: [facility];  Service: Cardiovascular;;   PERIPHERAL VASCULAR BALLOON ANGIOPLASTY Left 03/12/2021   Procedure: PERIPHERAL VASCULAR BALLOON ANGIOPLASTY;  Surgeon: Serafina Mitchell, MD;  Location: [facility];  Service: Cardiovascular;  Laterality: Left;   PERIPHERAL  VASCULAR BALLOON ANGIOPLASTY  08/22/2021   Procedure: PERIPHERAL VASCULAR BALLOON ANGIOPLASTY;  Surgeon: Marty Heck, MD;  Location: [facility];  Service: Cardiovascular;;  Left AVF PTA   TONSILLECTOMY      Social:  Lives with grandson Occupation: Retired Support: Family in the area Level of Function: Some assistance with ADL/iADL, uses cane, walker, and wheelchair PCP: [place] 4 of 2274 or (0411) Substances: Prior tobacco use. 25 pack year history and quit last year. Denies alcohol or other substance use.   Family History:  Father - MI at 73 y/o, CVA, Diabetes, HTN Mother - MI at 57y/o  Allergies: Allergies as of 07/29/2022   (No Known Allergies)    Review of Systems: A complete ROS was negative except as per HPI.   OBJECTIVE:   Physical Exam: Blood pressure (!) 110/92, pulse 61, temperature 97.7 F (36.5 C), temperature source Oral, resp. rate 18, height '5\' 4"'$  (1.626 m), weight 63 kg, SpO2 98 %.  Constitutional: well appearing, no acute distress HENT: normocephalic  atraumatic, mucous membranes dry Eyes: conjunctiva non-erythematous Neck: supple Cardiovascular: regular rate and rhythm, 1/6 systolic ejecting murmur. No gallops or rubs Pulmonary/Chest: normal work of breathing on room air, lungs clear to auscultation bilaterally Abdominal: soft, non-tender, non-distended MSK: normal bulk and tone. R. Transmetatarsal amputation, left lower extremity with 1st two metatarsopahlangeal amputations. Left forearm AV fistula with thrill.  Neurological: alert & oriented to place and person Skin: warm and dry Psych: pleasant  Labs: CBC    Component Value Date/Time   WBC 6.0 07/29/2022 0443   RBC 3.95 07/29/2022 0443   HGB 11.9 (L) 07/29/2022 0443   HCT 37.5 07/29/2022 0443   PLT 184 07/29/2022 0443   MCV 94.9 07/29/2022 0443   MCH 30.1 07/29/2022 0443   MCHC 31.7 07/29/2022 0443   RDW 19.8 (H) 07/29/2022 0443   LYMPHSABS 1.1 07/29/2022 0443    MONOABS 0.7 07/29/2022 0443   EOSABS 0.1 07/29/2022 0443   BASOSABS 0.1 07/29/2022 0443     CMP     Component Value Date/Time   NA 136 07/29/2022 0443   NA 140 08/25/2016 0000   K 3.7 07/29/2022 0443   CL 96 (L) 07/29/2022 0443   CO2 25 07/29/2022 0443   GLUCOSE 171 (H) 07/29/2022 0443   BUN 25 (H) 07/29/2022 0443   BUN 31 (A) 08/25/2016 0000   CREATININE 4.06 (H) 07/29/2022 0443   CALCIUM 9.4 07/29/2022 0443   PROT 7.0 07/29/2022 0443   ALBUMIN 3.1 (L) 07/29/2022 0443   AST 31 07/29/2022 0443   ALT 19 07/29/2022 0443   ALKPHOS 210 (H) 07/29/2022 0443   BILITOT 0.7 07/29/2022 0443   GFRNONAA 11 (L) 07/29/2022 0443   GFRAA 5 (L) 01/31/2020 1014    Imaging:  Chest xray with no focal opacity, effusion or edema present. Compared to prior 3 months ago no change.   Head CT w/o contrast no new lesions identified. No concern for acute infarction or hemorrhage.   CT cervical spine no acute trauma. chronic changes at C4-C6.   EKG: personally reviewed my interpretation is sinus rhythm at a rate of 70 bpm with PAC's. LAD. More pronounced t wave inversions in inferior leads. Inverted QRS complexes in V2 and V3. prolonged QTc  Prior EKG 14-May-2022 with more pronounced t wave inversions in inferior leads and inverted QRS complexes in V2/V3.   ASSESSMENT & PLAN:   Assessment & Plan by Problem: Principal Problem:   Chest pain   Danyela Jerilynn Mages Hafford is a 72 y.o. person living with a history of ESRD, dementia, CAD s/p PCI, lower extremity necrotizing ischemic/infectious s/p amputations, atrial fibrillation, combined heart failure, COPD, T2DM, HTN, and HLD who presents for a week of chest pressure and admitted for further cardiac evaluation and monitoring on hospital day 0  NSTEMI Hx of CAD s/p PCI Significant cardiac history with remote stent placement. Last catheterization at [name] in 2019 with non obstructing disease in the LAD and RCA. Her circumflex was heavily calcified with stenosis. They  recommended continuing medical therapy at this time due to difficult PCI with calficiation and prior stent in thhis area.   She presents with persistent central chest pressure that radiates into her left neck for the past week. The pain occurs at rest and is relieved by relaxation and tylenol. EKG with more prominent t wave inversions in the inferior leads and QRS changes in V2/V3. No ST wave depression or elevation concerning for STEMI. Troponin elevated at 200 and down to 167 on repeat. Did  not take any home nitroglycerin. She is high risk for progression of prior CAD with history of diabetes, HTN, HLD, and recent possible cardiac arrest. Home medications including anticoagulation, metoprolol, and statin therapy. Denies being on antiplatelet therapy but plavix listed in home medications. Her cardiac symptoms seem to be a mix of atypical and typical features. With EKG changes and elevated troponin compared to 3 months ago will admit with concern for NSTEMI and obtain echocardiogram. Curious if down-trending troponin elevated and having resolving NSTEM, though do not see evidence of this on EKG. Will also start on heparin and reach out to cardiology for further recommendations. Per patient's daughter and HCPOA they would want further interventions such as PCI if needed.  -cardiology consulted -heparin GGT, received 324 mg aspirin in ED.  -echocardiogram pending -nitro PRN continued chest pain -cardiac monitoring -continue statin therapy. HR in the 50-60's will hold metoprolol for now -determine if on antiplatelet therapy [facility]  Hx of combined systolic and diastolic HF Last echocardiogram 04/2019 at [facility], EF over 55% with mild LVD and grade 2 diastolic dysfunction. No wall motion abnormalities. GDMT limited by ESRD, current medication of lisinopril and metoprolol. Does not appear hypervolemic on exam do not suspect a CHF exacerbation -echocardiogram pending  Hx of atrial  fibrillation Sinus rhythm on admission with frequent PAC's. Has been adherent to home eliquis dosing -heparin ggt with concern for NSTEMI  ESRD MWF dialysis, last session completed yesterday at [facility] center [place]. Follows with nephrology at the [place]. Left fistula without complications. Electrolytes stable. Next dialysis session tomorrow at 0500. Depending on work, will consult our nephrology team.  -renally dose medications -continue [place] -will also consult nephrology with plan for patient to still be hospitalized tomorrow  Type 2 DM Home regimen of SSI with meals and levemir 15 U QHS. Most recent A1c in 04/2022 of 7.0%.  -continue SSI and levemir 15 U QHS  PAD Bilateral lower extremity arterial disease, s/p LLE SFA stent 2023. Recent hospitalization last year for infectious/ischemic disease requiring right TMA and left 1st two toe amputation. She has been doing well this.  -continue atorvastatin -will need to determine if on antiplatelet therapy  Schizoaffective & bipolar disorder Dementia Follows with [facility]. Currently adherent to Abilify 12 mg daily, daughter notes this was decreased and she has been having more frequent episodes of hallucinations. They are trying to schedule an appointment sooner but are having difficulty getting this done.  -continue abilify 12 mg daily  Hx of pulmonary embolism and DVT Remote history of DVT and pulmonary embolism. Denies leg swelling, redness, or pain. Low suspicion for PE as explanation for chest pain with stable vital signs, adherence to outpatient anticoagulation, no new oxygen requirement, and absence of tachycardia and right heart strain on EKG. -continue IV anticoagulation, transition back to home dosing once stable  OSA -does not wear cPAP at home  GERD -continue home Protonix 40 mg BID  HLD -continue home atorvastatin 40 mg daily  HTN -continue lisinopril 2.5 daily, hold metoprolol with heart rate in  50-60's  Diet: Renal VTE: heparin ggt IVF: None,None Code: Full  Prior to Admission Living Arrangement: Home, living family Anticipated Discharge Location: Home Barriers to Discharge: further evaluation for chest pain  Dispo: Admit patient to Observation with expected length of stay less than 2 midnights.  Signed: Riesa Pope, MD Internal Medicine Resident PGY-3  07/29/2022, 10:16 AM

## 2022-07-29 NOTE — ED Notes (Signed)
Breakfast tray ordered 

## 2022-07-29 NOTE — Progress Notes (Signed)
ANTICOAGULATION CONSULT NOTE - Initial Consult  Pharmacy Consult for heparin Indication: chest pain/ACS  No Known Allergies  Patient Measurements: Height: '5\' 4"'$  (162.6 cm) Weight: 63 kg (138 lb 14.2 oz) IBW/kg (Calculated) : 54.7   Vital Signs: Temp: 97.8 F (36.6 C) (03/05 1203) Temp Source: Oral (03/05 1203) BP: 115/65 (03/05 1203) Pulse Rate: 65 (03/05 1203)  Labs: Recent Labs    07/29/22 0443 07/29/22 0658  HGB 11.9*  --   HCT 37.5  --   PLT 184  --   CREATININE 4.06*  --   TROPONINIHS 200* 167*    Estimated Creatinine Clearance: 11.1 mL/min (A) (by C-G formula based on SCr of 4.06 mg/dL (H)).   Medical History: Past Medical History:  Diagnosis Date   Acute delirium 11/18/2014   Acute encephalopathy    Acute on chronic respiratory failure with hypoxia (HCC) 09/09/2016   Acute respiratory failure with hypoxia (HCC) 11/29/2015   Anemia in chronic kidney disease 12/09/2014   Anxiety    Arthritis    Benign hypertension    Bipolar affective disorder ([place]) 12/09/2014   CAD (coronary artery disease), native coronary artery with 2 stents  11/17/2014   Charcot foot due to diabetes mellitus ([place])    Chronic combined systolic and diastolic CHF (congestive heart failure) ([place]) 11/18/2014   Closed left ankle fracture 11/17/2014   Confusion 01/21/2015   Depression    Diabetes mellitus without complication ([place])    End stage renal disease on dialysis ([place])    Fracture dislocation of ankle 11/17/2014   GERD (gastroesophageal reflux disease)    Heart murmur    History of blood transfusion    History of bronchitis    History of pneumonia    Hyperlipidemia 03/26/2015   Hypertension associated with diabetes ([place]) 11/18/2014   Hypertensive heart/renal disease with failure ([place]) 12/09/2014   Hypokalemia 11/17/2014   Multiple falls 01/21/2015   Obesity 11/17/2014   Onychomycosis 10/07/2016   Right leg DVT ([place]) 12/05/2015   SIRS (systemic inflammatory response syndrome) ([place])  08/08/2016   Sleep apnea    Unstable angina ([place]) 12/26/2017   Ventricular tachycardia ([place])     Medications:  Medications Prior to Admission  Medication Sig Dispense Refill Last Dose   acetaminophen (TYLENOL) 325 MG tablet Take 975 mg by mouth 3 (three) times a week.   unk   albuterol (VENTOLIN HFA) 108 (90 Base) MCG/ACT inhaler Inhale 1-2 puffs into the lungs every 6 (six) hours as needed for wheezing or shortness of breath.   unk   apixaban (ELIQUIS) 5 MG TABS tablet Take 2.5 mg by mouth 2 (two) times daily with a meal.   07/28/2022 at am   ARIPiprazole (ABILIFY) 2 MG tablet Take 2 mg by mouth at bedtime. Take with 1/2 20 mg tablet for a total dose of 12 mg   07/28/2022   ARIPiprazole (ABILIFY) 20 MG tablet Take 10 mg by mouth at bedtime. Take with a 4 mg tablet for a total dose of 14 mg   07/28/2022   atorvastatin (LIPITOR) 80 MG tablet Take 40 mg by mouth at bedtime.   07/28/2022   CALCIUM PO Take 1 tablet by mouth daily.      ferric citrate (AURYXIA) 1 GM 210 MG(Fe) tablet Take 420 mg by mouth 2 (two) times daily with a meal.   07/28/2022   gabapentin (NEURONTIN) 100 MG capsule Take 100 mg by mouth 2 (two) times daily.   07/28/2022   insulin aspart (NOVOLOG) 100  UNIT/ML injection Inject 6 Units into the skin 3 (three) times daily as needed for high blood sugar (CBG >300).   07/28/2022   insulin detemir (LEVEMIR FLEXTOUCH) 100 UNIT/ML FlexPen Inject 15 Units into the skin daily.   07/28/2022   isosorbide mononitrate (IMDUR) 60 MG 24 hr tablet Take 60 mg by mouth daily with breakfast.   07/28/2022   lisinopril (ZESTRIL) 5 MG tablet Take 2.5 mg by mouth daily.   07/28/2022   Melatonin 10 MG TABS Take 10 mg by mouth at bedtime.   07/28/2022   metoprolol (TOPROL-XL) 200 MG 24 hr tablet Take 200 mg by mouth daily.   07/28/2022 at am   multivitamin (RENA-VIT) TABS tablet Take 1 tablet by mouth at bedtime.   10 07/28/2022   nitroGLYCERIN (NITROSTAT) 0.4 MG SL tablet Place 0.4 mg under the tongue every 5 (five) minutes as  needed for chest pain.   unk   pantoprazole (PROTONIX) 40 MG tablet Take 40 mg by mouth 2 (two) times daily before a meal.   07/28/2022   Tiotropium Bromide Monohydrate (SPIRIVA RESPIMAT) 1.25 MCG/ACT AERS Inhale 2 puffs into the lungs every morning.   07/28/2022   Scheduled:   ARIPiprazole  12 mg Oral QHS   atorvastatin  40 mg Oral QHS   ferric citrate  420 mg Oral BID WC   insulin aspart  0-6 Units Subcutaneous TID WC   insulin detemir  15 Units Subcutaneous QHS   [START ON 07/30/2022] isosorbide mononitrate  60 mg Oral Q breakfast   lidocaine  1 patch Transdermal Q24H   lisinopril  2.5 mg Oral Daily   ondansetron (ZOFRAN) IV  4 mg Intravenous Once   pantoprazole  40 mg Oral BID AC   umeclidinium bromide  2 puff Inhalation q morning    Assessment: 22 you female with CP and history of CAD w/ PCI and ESRD on HD. Pharmacy consulted to dose heparin. She is noted with history of afib and is on apixaban PTA (last dose taken 3/5  ~ 11am) -Hg= 11.9  Goal of Therapy:  Heparin level 0.3-0.7 units/ml aPTT 66-102 seconds Monitor platelets by anticoagulation protocol: Yes   Plan:  -start heparin 900 units/hr at 11pm -aPTT and heparin level in 8 hours -Daily heparin level, aPTT and CBC  Hildred Laser, PharmD Clinical Pharmacist **Pharmacist phone directory can now be found on [email or web address] ([email or web address]).  Listed under [place].

## 2022-07-29 NOTE — Consult Note (Signed)
Renal Service Consult Note [facility]  Robin [name] 07/29/2022 [name], MD Requesting Physician: Dr. Saverio Danker  Reason for Consult: ESRD pt w/ NSTEMI HPI: The patient is a 72 y.o. year-old w/ PMH as below who presented to ED this am w/ c/o chest pains. Hx of LHC in 2019. Trops were up at 200 and EKG showed TWI's inferior leads. Pt was started on IV heparin and cardiology consulted. Echo ordered. Pt admitted, we are asked to see for dialysis.   Pt seen in room. Pt is w/o complaints.   ROS - denies CP, no joint pain, no HA, no blurry vision, no rash, no diarrhea, no nausea/ vomiting, no dysuria, no difficulty voiding   Past Medical History  Past Medical History:  Diagnosis Date   Acute delirium 11/18/2014   Acute encephalopathy    Acute on chronic respiratory failure with hypoxia ([place]) 09/09/2016   Acute respiratory failure with hypoxia (HCC) 11/29/2015   Anemia in chronic kidney disease 12/09/2014   Anxiety    Arthritis    Benign hypertension    Bipolar affective disorder ([place]) 12/09/2014   CAD (coronary artery disease), native coronary artery with 2 stents  11/17/2014   Charcot foot due to diabetes mellitus ([place])    Chronic combined systolic and diastolic CHF (congestive heart failure) ([place]) 11/18/2014   Closed left ankle fracture 11/17/2014   Confusion 01/21/2015   Depression    Diabetes mellitus without complication (HCC)    End stage renal disease on dialysis ([place])    Fracture dislocation of ankle 11/17/2014   GERD (gastroesophageal reflux disease)    Heart murmur    History of blood transfusion    History of bronchitis    History of pneumonia    Hyperlipidemia 03/26/2015   Hypertension associated with diabetes ([place]) 11/18/2014   Hypertensive heart/renal disease with failure ([place]) 12/09/2014   Hypokalemia 11/17/2014   Multiple falls 01/21/2015   Obesity 11/17/2014   Onychomycosis 10/07/2016   Right leg DVT ([place]) 12/05/2015   SIRS (systemic inflammatory response  syndrome) ([place]) 08/08/2016   Sleep apnea    Unstable angina ([place]) 12/26/2017   Ventricular tachycardia ([place])    Past Surgical History  Past Surgical History:  Procedure Laterality Date   A/V FISTULAGRAM N/A 01/03/2020   Procedure: A/V FISTULAGRAM;  Surgeon: Serafina Mitchell, MD;  Location: [place] [facility];  Service: Cardiovascular;  Laterality: N/A;   A/V FISTULAGRAM Left 03/12/2021   Procedure: A/V FISTULAGRAM;  Surgeon: Serafina Mitchell, MD;  Location: [place] [facility];  Service: Cardiovascular;  Laterality: Left;   ABDOMINAL HYSTERECTOMY     ANGIOPLASTY Left 01/26/2020   Procedure: LEFT ARM GRAPH THROMBECTOMY WITH BALLON ANGIOPLASTY;  Surgeon: Waynetta Sandy, MD;  Location: [place];  Service: Vascular;  Laterality: Left;   ANKLE CLOSED REDUCTION N/A 11/17/2014   Procedure: CLOSED REDUCTION ANKLE;  Surgeon: Earlie Server, MD;  Location: [place];  Service: Orthopedics;  Laterality: N/A;   ANKLE FUSION Left 03/16/2015   Procedure: Left Tibiocalcaneal Fusion;  Surgeon: Newt Minion, MD;  Location: [place];  Service: Orthopedics;  Laterality: Left;   APPLICATION OF WOUND VAC Right 08/06/2016   Procedure: APPLICATION OF PREVENA WOUND VAC;  Surgeon: Newt Minion, MD;  Location: [place];  Service: Orthopedics;  Laterality: Right;   AV FISTULA PLACEMENT Left [id number]   done at [place]     2 stent    CHOLECYSTECTOMY     CORONARY  STENT PLACEMENT     FISTULOGRAM Left 01/26/2020   Procedure: FISTULOGRAM;  Surgeon: Waynetta Sandy, MD;  Location: [place];  Service: Vascular;  Laterality: Left;   FOOT ARTHRODESIS Right 08/06/2016   Procedure: Right Foot Fusion Lisfranc Joint;  Surgeon: Newt Minion, MD;  Location: [place];  Service: Orthopedics;  Laterality: Right;   HARDWARE REMOVAL Left 03/16/2015   Procedure: Removal Hardware Left Ankle;  Surgeon: Newt Minion, MD;  Location: [place];  Service: Orthopedics;  Laterality: Left;   IR AV DIALY SHUNT INTRO  NEEDLE/INTRACATH INITIAL W/PTA/IMG LEFT  01/05/2018   IR US GUIDE VASC ACCESS LEFT  01/05/2018   LEFT HEART CATH AND CORONARY ANGIOGRAPHY N/A 12/28/2017   Procedure: LEFT HEART CATH AND CORONARY ANGIOGRAPHY;  Surgeon: Burnell Blanks, MD;  Location: [place] [facility];  Service: Cardiovascular;  Laterality: N/A;   LOWER EXTREMITY ANGIOGRAPHY N/A 03/21/2021   Procedure: LOWER EXTREMITY ANGIOGRAPHY;  Surgeon: Marty Heck, MD;  Location: [place] [facility];  Service: Cardiovascular;  Laterality: N/A;   ORIF ANKLE FRACTURE Left 11/20/2014   Procedure: OPEN REDUCTION INTERNAL FIXATION (ORIF) ANKLE FRACTURE;  Surgeon: Renette Butters, MD;  Location: [place];  Service: Orthopedics;  Laterality: Left;   PERIPHERAL VASCULAR BALLOON ANGIOPLASTY  01/03/2020   Procedure: PERIPHERAL VASCULAR BALLOON ANGIOPLASTY;  Surgeon: Serafina Mitchell, MD;  Location: [place] [facility];  Service: Cardiovascular;;   PERIPHERAL VASCULAR BALLOON ANGIOPLASTY Left 03/12/2021   Procedure: PERIPHERAL VASCULAR BALLOON ANGIOPLASTY;  Surgeon: Serafina Mitchell, MD;  Location: [place] [facility];  Service: Cardiovascular;  Laterality: Left;   PERIPHERAL VASCULAR BALLOON ANGIOPLASTY  08/22/2021   Procedure: PERIPHERAL VASCULAR BALLOON ANGIOPLASTY;  Surgeon: Marty Heck, MD;  Location: [place] [facility];  Service: Cardiovascular;;  Left AVF PTA   TONSILLECTOMY     Family History  Family History  Family history unknown: Yes   Social History  reports that she has been smoking cigarettes. She has been smoking an average of 1 pack per day. She has never used smokeless tobacco. She reports that she does not drink alcohol and does not use drugs. Allergies No Known Allergies Home medications Prior to Admission medications   Medication Sig Start Date End Date Taking? Authorizing Provider  acetaminophen (TYLENOL) 325 MG tablet Take 975 mg by mouth 3 (three) times a week.   Yes [provider]  albuterol  (VENTOLIN HFA) 108 (90 Base) MCG/ACT inhaler Inhale 1-2 puffs into the lungs every 6 (six) hours as needed for wheezing or shortness of breath.   Yes [provider]  apixaban (ELIQUIS) 5 MG TABS tablet Take 2.5 mg by mouth 2 (two) times daily with a meal.   Yes [provider]  ARIPiprazole (ABILIFY) 2 MG tablet Take 2 mg by mouth at bedtime. Take with 1/2 20 mg tablet for a total dose of 12 mg   Yes [provider]  ARIPiprazole (ABILIFY) 20 MG tablet Take 10 mg by mouth at bedtime. Take with a 4 mg tablet for a total dose of 14 mg   Yes [provider]  atorvastatin (LIPITOR) 80 MG tablet Take 40 mg by mouth at bedtime.   Yes [provider]  CALCIUM PO Take 1 tablet by mouth daily.   Yes [provider]  ferric citrate (AURYXIA) 1 GM 210 MG(Fe) tablet Take 420 mg by mouth 2 (two) times daily with a meal.   Yes [provider]  gabapentin (NEURONTIN) 100  MG capsule Take 100 mg by mouth 2 (two) times daily.   Yes [provider]  insulin aspart (NOVOLOG) 100 UNIT/ML injection Inject 6 Units into the skin 3 (three) times daily as needed for high blood sugar (CBG >300).   Yes [provider]  insulin detemir (LEVEMIR FLEXTOUCH) 100 UNIT/ML FlexPen Inject 15 Units into the skin daily.   Yes [provider]  isosorbide mononitrate (IMDUR) 60 MG 24 hr tablet Take 60 mg by mouth daily with breakfast.   Yes [provider]  lisinopril (ZESTRIL) 5 MG tablet Take 2.5 mg by mouth daily. 09/18/20  Yes [provider]  Melatonin 10 MG TABS Take 10 mg by mouth at bedtime.   Yes [provider]  metoprolol (TOPROL-XL) 200 MG 24 hr tablet Take 200 mg by mouth daily.   Yes [provider]  multivitamin (RENA-VIT) TABS tablet Take 1 tablet by mouth at bedtime.  12/11/17  Yes [provider]  nitroGLYCERIN (NITROSTAT) 0.4 MG SL tablet Place 0.4 mg under the tongue every 5 (five) minutes  as needed for chest pain.   Yes [provider]  pantoprazole (PROTONIX) 40 MG tablet Take 40 mg by mouth 2 (two) times daily before a meal.   Yes [provider]  Tiotropium Bromide Monohydrate (SPIRIVA RESPIMAT) 1.25 MCG/ACT AERS Inhale 2 puffs into the lungs every morning.   Yes [provider]     Vitals:   07/29/22 0800 07/29/22 0830 07/29/22 0931 07/29/22 1203  BP: 127/69 130/76 131/68 115/65  Pulse: (!) 59 (!) 57 65 65  Resp: '20 12 16 17  '$ Temp:   98 F (36.7 C) 97.8 F (36.6 C)  TempSrc:   Oral Oral  SpO2: 100% 97% 98% 97%  Weight:      Height:       Exam Gen alert, no distress No rash, cyanosis or gangrene Sclera anicteric, throat clear  No jvd or bruits Chest clear bilat to bases, no rales/ wheezing RRR no MRG Abd soft ntnd no mass or ascites +bs GU defer MS R foot TMA, L foot sp toe amps Ext no LE or UE edema, no wounds or ulcers Neuro is alert, Ox 3 , nf    LUA AVF+bruit    OP HD: [place] MWF  3.5h  400/1.5  59kg  2/2 bath  AVF  Hep none  - last HD 3/4, post wt 59.2kg - venofer '100mg'$  tiw thru 3/18 - hectorol 1 mcg IV tiw - korsuva 0.6 IV tiw - mircera 50 mcg IV q 2 wks, last 2/26, due 3/11   Assessment/ Plan: NSTEMI - IV heparin, seen by cardiology, per cards/ pmd ESRD - on HD MWF. Has not missed HD. HD tomorrow.  HTN/ volume - BP's are reasonable, up 3.5kg by wts. UF same next hd Anemia esrd - Hb 11s, no esa needs.  MBD ckd - CCa and phos are in range. Cont IV vdra.  Atrial fib - per pmd PAD - hx foot/ toe amps H/o DVT/ pulm embolism Hx dementia/ bipolar/ schizoaffective d/o       Kelly Splinter  MD [facility] 07/29/2022, 4:08 PM  Recent Labs  Lab 07/29/22 0443 07/29/22 1213  HGB 11.9*  --   ALBUMIN 3.1*  --   CALCIUM 9.4  --   PHOS  --  4.9*  CREATININE 4.06*  --   K 3.7  --    Inpatient medications:  amLODipine  2.5 mg Oral Daily   ARIPiprazole  12 mg Oral QHS   atorvastatin  40 mg Oral QHS   ferric citrate  420 mg  Oral BID WC   insulin aspart  0-6 Units Subcutaneous TID WC   insulin detemir  15 Units Subcutaneous QHS   isosorbide mononitrate  15 mg Oral Q breakfast   lidocaine  1 patch Transdermal Q24H   ondansetron (ZOFRAN) IV  4 mg Intravenous Once   pantoprazole  40 mg Oral BID AC   umeclidinium bromide  2 puff Inhalation q morning    heparin     albuterol, nitroGLYCERIN

## 2022-07-29 NOTE — Progress Notes (Signed)
  Echocardiogram 2D Echocardiogram has been performed.  Robin [name] 07/29/2022, 12:12 PM

## 2022-07-29 NOTE — ED Notes (Signed)
ED TO INPATIENT HANDOFF REPORT  ED Nurse Name and Phone #: Rock Nephew [phone]  S Name/Age/Gender Robin [name] 72 y.o. female Room/Bed: 032C/032C  Code Status   Code Status: Full Code  Home/SNF/Other Home Patient oriented to: self, place, time, and situation Is this baseline? Yes   Triage Complete: Triage complete  Chief Complaint Chest pain [R07.9]  Triage Note Pt states she has been nauseous and vomiting as well as decreased appetite. Taking all medications, and going to dialysis. Last BP 117/72 HR 66 SPO2 100% RA RR 16 CBG 193  A&ox4     Allergies No Known Allergies  Level of Care/Admitting Diagnosis ED Disposition     ED Disposition  Admit   Condition  --   Comment  Hospital Area: [facility] [[id number]]  Level of Care: Telemetry Cardiac [103]  May place patient in observation at [facility] or [facility] if equivalent level of care is available:: No  Covid Evaluation: Asymptomatic - no recent exposure (last 10 days) testing not required  Diagnosis: Chest pain [744799]  Admitting Physician: Charise Killian [id number]  Attending Physician: Charise Killian [id number]          B Medical/Surgery History Past Medical History:  Diagnosis Date   Acute delirium 11/18/2014   Acute encephalopathy    Acute on chronic respiratory failure with hypoxia ([place]) 09/09/2016   Acute respiratory failure with hypoxia ([place]) 11/29/2015   Anemia in chronic kidney disease 12/09/2014   Anxiety    Arthritis    Benign hypertension    Bipolar affective disorder ([place]) 12/09/2014   CAD (coronary artery disease), native coronary artery with 2 stents  11/17/2014   Charcot foot due to diabetes mellitus ([place])    Chronic combined systolic and diastolic CHF (congestive heart failure) ([place]) 11/18/2014   Closed left ankle fracture 11/17/2014   Confusion 01/21/2015   Depression    Diabetes mellitus without complication (HCC)    End stage renal disease on dialysis ([place])    Fracture  dislocation of ankle 11/17/2014   GERD (gastroesophageal reflux disease)    Heart murmur    History of blood transfusion    History of bronchitis    History of pneumonia    Hyperlipidemia 03/26/2015   Hypertension associated with diabetes ([place]) 11/18/2014   Hypertensive heart/renal disease with failure ([place]) 12/09/2014   Hypokalemia 11/17/2014   Multiple falls 01/21/2015   Obesity 11/17/2014   Onychomycosis 10/07/2016   Right leg DVT ([place]) 12/05/2015   SIRS (systemic inflammatory response syndrome) ([place]) 08/08/2016   Sleep apnea    Unstable angina ([place]) 12/26/2017   Ventricular tachycardia ([place])    Past Surgical History:  Procedure Laterality Date   A/V FISTULAGRAM N/A 01/03/2020   Procedure: A/V FISTULAGRAM;  Surgeon: Serafina Mitchell, MD;  Location: [facility];  Service: Cardiovascular;  Laterality: N/A;   A/V FISTULAGRAM Left 03/12/2021   Procedure: A/V FISTULAGRAM;  Surgeon: Serafina Mitchell, MD;  Location: [facility];  Service: Cardiovascular;  Laterality: Left;   ABDOMINAL HYSTERECTOMY     ANGIOPLASTY Left 01/26/2020   Procedure: LEFT ARM GRAPH THROMBECTOMY WITH BALLON ANGIOPLASTY;  Surgeon: Waynetta Sandy, MD;  Location: [facility];  Service: Vascular;  Laterality: Left;   ANKLE CLOSED REDUCTION N/A 11/17/2014   Procedure: CLOSED REDUCTION ANKLE;  Surgeon: Earlie Server, MD;  Location: [facility];  Service: Orthopedics;  Laterality: N/A;   ANKLE FUSION Left 03/16/2015   Procedure: Left Tibiocalcaneal Fusion;  Surgeon: Meridee Score  V, MD;  Location: [facility];  Service: Orthopedics;  Laterality: Left;   APPLICATION OF WOUND VAC Right 08/06/2016   Procedure: APPLICATION OF PREVENA WOUND VAC;  Surgeon: Newt Minion, MD;  Location: [facility];  Service: Orthopedics;  Laterality: Right;   AV FISTULA PLACEMENT Left [id number]   done at [facility]     2 stent    CHOLECYSTECTOMY     CORONARY STENT PLACEMENT     FISTULOGRAM Left 01/26/2020   Procedure:  FISTULOGRAM;  Surgeon: Waynetta Sandy, MD;  Location: [facility];  Service: Vascular;  Laterality: Left;   FOOT ARTHRODESIS Right 08/06/2016   Procedure: Right Foot Fusion Lisfranc Joint;  Surgeon: Newt Minion, MD;  Location: [facility];  Service: Orthopedics;  Laterality: Right;   HARDWARE REMOVAL Left 03/16/2015   Procedure: Removal Hardware Left Ankle;  Surgeon: Newt Minion, MD;  Location: [facility];  Service: Orthopedics;  Laterality: Left;   IR AV DIALY SHUNT INTRO NEEDLE/INTRACATH INITIAL W/PTA/IMG LEFT  01/05/2018   IR US GUIDE VASC ACCESS LEFT  01/05/2018   LEFT HEART CATH AND CORONARY ANGIOGRAPHY N/A 12/28/2017   Procedure: LEFT HEART CATH AND CORONARY ANGIOGRAPHY;  Surgeon: Burnell Blanks, MD;  Location: [facility];  Service: Cardiovascular;  Laterality: N/A;   LOWER EXTREMITY ANGIOGRAPHY N/A 03/21/2021   Procedure: LOWER EXTREMITY ANGIOGRAPHY;  Surgeon: Marty Heck, MD;  Location: [facility];  Service: Cardiovascular;  Laterality: N/A;   ORIF ANKLE FRACTURE Left 11/20/2014   Procedure: OPEN REDUCTION INTERNAL FIXATION (ORIF) ANKLE FRACTURE;  Surgeon: Renette Butters, MD;  Location: [facility];  Service: Orthopedics;  Laterality: Left;   PERIPHERAL VASCULAR BALLOON ANGIOPLASTY  01/03/2020   Procedure: PERIPHERAL VASCULAR BALLOON ANGIOPLASTY;  Surgeon: Serafina Mitchell, MD;  Location: [facility];  Service: Cardiovascular;;   PERIPHERAL VASCULAR BALLOON ANGIOPLASTY Left 03/12/2021   Procedure: PERIPHERAL VASCULAR BALLOON ANGIOPLASTY;  Surgeon: Serafina Mitchell, MD;  Location: [facility];  Service: Cardiovascular;  Laterality: Left;   PERIPHERAL VASCULAR BALLOON ANGIOPLASTY  08/22/2021   Procedure: PERIPHERAL VASCULAR BALLOON ANGIOPLASTY;  Surgeon: Marty Heck, MD;  Location: [facility];  Service: Cardiovascular;;  Left AVF PTA   TONSILLECTOMY       A IV Location/Drains/Wounds Patient Lines/Drains/Airways Status     Active  Line/Drains/Airways     Name Placement date Placement time Site Days   Peripheral IV 07/29/22 22 G Posterior;Right Hand 07/29/22  0446  Hand  less than 1   Fistula / Graft Left Upper arm Arteriovenous fistula 09/30/14  --  Upper arm  2859   Fistula / Graft Left Upper arm Arteriovenous fistula --  --  Upper arm  --   Fistula / Graft Left Upper arm --  --  Upper arm  --   Incision (Closed) 03/16/15 Leg Left 03/16/15  1346  -- 2692   Incision (Closed) 08/06/16 Foot Right 08/06/16  0915  -- 2183   Incision (Closed) 09/09/16 Foot Right 09/09/16  0924  -- 2149            Intake/Output Last 24 hours No intake or output data in the 24 hours ending 07/29/22 0850  Labs/Imaging Results for orders placed or performed during the hospital encounter of 07/29/22 (from the past 48 hour(s))  Lipase, blood     Status: Abnormal   Collection Time: 07/29/22  4:43 AM  Result Value Ref Range   Lipase 52 (  H) 11 - 51 U/L    Comment: Performed at [facility], [place] [address], [place], [place] [address]  Troponin I (High Sensitivity)     Status: Abnormal   Collection Time: 07/29/22  4:43 AM  Result Value Ref Range   Troponin I (High Sensitivity) 200 (HH) <18 ng/L    Comment: CRITICAL RESULT CALLED TO, READ BACK BY AND VERIFIED WITH R. TERRY, RN, [phone], 07/29/22, EADEDOKUN (NOTE) Elevated high sensitivity troponin I (hsTnI) values and significant  changes across serial measurements may suggest ACS but many other  chronic and acute conditions are known to elevate hsTnI results.  Refer to the "Links" section for chest pain algorithms and additional  guidance. Performed at [facility], [place] [address], [place], [place] [address]   Comprehensive metabolic panel     Status: Abnormal   Collection Time: 07/29/22  4:43 AM  Result Value Ref Range   Sodium 136 135 - 145 mmol/L   Potassium 3.7 3.5 - 5.1 mmol/L   Chloride 96 (L) 98 - 111 mmol/L   CO2 25 22 - 32 mmol/L   Glucose, Bld 171 (H) 70 - 99  mg/dL    Comment: Glucose reference range applies only to samples taken after fasting for at least 8 hours.   BUN 25 (H) 8 - 23 mg/dL   Creatinine, Ser 4.06 (H) 0.44 - 1.00 mg/dL   Calcium 9.4 8.9 - 10.3 mg/dL   Total Protein 7.0 6.5 - 8.1 g/dL   Albumin 3.1 (L) 3.5 - 5.0 g/dL   AST 31 15 - 41 U/L   ALT 19 0 - 44 U/L   Alkaline Phosphatase 210 (H) 38 - 126 U/L   Total Bilirubin 0.7 0.3 - 1.2 mg/dL   GFR, Estimated 11 (L) >60 mL/min    Comment: (NOTE) Calculated using the CKD-EPI Creatinine Equation (2021)    Anion gap 15 5 - 15    Comment: Performed at [facility], [place] [address] [place], [place] [address]  Brain natriuretic peptide     Status: Abnormal   Collection Time: 07/29/22  4:43 AM  Result Value Ref Range   B Natriuretic Peptide 1,147.2 (H) 0.0 - 100.0 pg/mL    Comment: Performed at [facility] [address] [place], [place] [address]  CBC with Differential     Status: Abnormal   Collection Time: 07/29/22  4:43 AM  Result Value Ref Range   WBC 6.0 4.0 - 10.5 K/uL   RBC 3.95 3.87 - 5.11 MIL/uL   Hemoglobin 11.9 (L) 12.0 - 15.0 g/dL   HCT 37.5 36.0 - 46.0 %   MCV 94.9 80.0 - 100.0 fL   MCH 30.1 26.0 - 34.0 pg   MCHC 31.7 30.0 - 36.0 g/dL   RDW 19.8 (H) 11.5 - 15.5 %   Platelets 184 150 - 400 K/uL   nRBC 0.0 0.0 - 0.2 %   Neutrophils Relative % 67 %   Neutro Abs 4.0 1.7 - 7.7 K/uL   Lymphocytes Relative 18 %   Lymphs Abs 1.1 0.7 - 4.0 K/uL   Monocytes Relative 12 %   Monocytes Absolute 0.7 0.1 - 1.0 K/uL   Eosinophils Relative 2 %   Eosinophils Absolute 0.1 0.0 - 0.5 K/uL   Basophils Relative 1 %   Basophils Absolute 0.1 0.0 - 0.1 K/uL   Immature Granulocytes 0 %   Abs Immature Granulocytes 0.01 0.00 - 0.07 K/uL    Comment:  Performed at [facility], [place] [address], [place], [place] [address]  Troponin I (High Sensitivity)     Status: Abnormal   Collection Time: 07/29/22  6:58 AM  Result Value Ref Range   Troponin I (High Sensitivity)  167 (HH) <18 ng/L    Comment: CRITICAL VALUE NOTED. VALUE IS CONSISTENT WITH PREVIOUSLY REPORTED/CALLED VALUE (NOTE) Elevated high sensitivity troponin I (hsTnI) values and significant  changes across serial measurements may suggest ACS but many other  chronic and acute conditions are known to elevate hsTnI results.  Refer to the "Links" section for chest pain algorithms and additional  guidance. Performed at [facility], [place] [address] [place], [place] [address]    CT Cervical Spine Wo Contrast  Result Date: 07/29/2022 CLINICAL DATA:  72 year old female status post fall 2 weeks ago with persistent dizziness, nausea. Vomiting and decreased appetite. Dialysis patient. EXAM: CT CERVICAL SPINE WITHOUT CONTRAST TECHNIQUE: Multidetector CT imaging of the cervical spine was performed without intravenous contrast. Multiplanar CT image reconstructions were also generated. RADIATION DOSE REDUCTION: This exam was performed according to the departmental dose-optimization program which includes automated exposure control, adjustment of the mA and/or kV according to patient size and/or use of iterative reconstruction technique. COMPARISON:  Head CT today reported separately. Cervical spine CT 10/27/2020. FINDINGS: Alignment: Chronic anterolisthesis of C4 on C5. Chronic straightening of cervical lordosis. Less reversal compared to 2022. Cervicothoracic junction alignment is within normal limits. Bilateral posterior element alignment is within normal limits. Skull base and vertebrae: Stable bone mineralization. Visualized skull base is intact. No atlanto-occipital dissociation. C1 and C2 appear intact and aligned. Chronic endplate erosions [id number], C5-C6. Chronic C6 inferior endplate compression. Unilateral chronic C4-C5 facet erosion. Suspect chronic dialysis related spondyloarthropathy. No acute osseous abnormality identified. Soft tissues and spinal canal: No prevertebral fluid or swelling. No visible canal  hematoma. Disc levels: Chronic degeneration C4-C5 through C6-C7. Mild multifactorial spinal stenosis suspected at C4-C5. Upper chest: Osteopenia. No acute osseous abnormality identified. Negative lung apices. Small volume retained gas and secretions in the upper thoracic esophagus. IMPRESSION: 1. No acute traumatic injury identified in the cervical spine. 2. 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 Mild multifactorial spinal stenosis suspected at C4-C5. Electronically Signed   By: Genevie Ann M.D.   On: 07/29/2022 06:03   CT Head Wo Contrast  Result Date: 07/29/2022 CLINICAL DATA:  72 year old female status post fall 2 weeks ago with persistent dizziness, nausea. Vomiting and decreased appetite. Dialysis patient. EXAM: CT HEAD WITHOUT CONTRAST TECHNIQUE: Contiguous axial images were obtained from the base of the skull through the vertex without intravenous contrast. RADIATION DOSE REDUCTION: This exam was performed according to the departmental dose-optimization program which includes automated exposure control, adjustment of the mA and/or kV according to patient size and/or use of iterative reconstruction technique. COMPARISON:  Head CT 10/27/2020.  Brain MRI 07/27/2020. FINDINGS: Brain: Stable cerebral volume. No midline shift, ventriculomegaly, mass effect, evidence of mass lesion, intracranial hemorrhage or evidence of cortically based acute infarction. Stable and normal for age gray-white differentiation. No convincing encephalomalacia. Vascular: Extensive Calcified atherosclerosis at the skull base. No suspicious intracranial vascular hyperdensity. Skull: No acute osseous abnormality identified. Sinuses/Orbits: Visualized paranasal sinuses and mastoids are stable and well aerated. Other: Calcified scalp vessel atherosclerosis. No orbit or scalp soft tissue injury identified. IMPRESSION: 1. No recent traumatic injury identified. Stable and normal for age noncontrast CT appearance of  the brain. 2. Advanced calcified atherosclerosis compatible with End stage renal disease.  Electronically Signed   By: Genevie Ann M.D.   On: 07/29/2022 05:58   DG Chest Port 1 View  Result Date: 07/29/2022 CLINICAL DATA:  72 year old female with history of shortness of breath. EXAM: PORTABLE CHEST 1 VIEW COMPARISON:  Chest x-ray 04/27/2022. FINDINGS: Lung volumes are normal. No consolidative airspace disease. No pleural effusions. No pneumothorax. No evidence of pulmonary edema. Heart size is mildly enlarged (unchanged). Upper mediastinal contours are within normal limits. Atherosclerotic calcifications are noted in the thoracic aorta. Vascular stent projecting over the left axillary region. IMPRESSION: 1. No radiographic evidence of acute cardiopulmonary disease. 2. Mild cardiomegaly. 3. Aortic atherosclerosis. Electronically Signed   By: Vinnie Langton M.D.   On: 07/29/2022 05:16    Pending Labs Unresulted Labs (From admission, onward)     Start     Ordered   07/29/22 0848  Magnesium  Once,   R        07/29/22 0847   07/29/22 0848  Phosphorus  Once,   R        07/29/22 0847   07/29/22 0847  HIV Antibody (routine testing w rflx)  (HIV Antibody (Routine testing w reflex) panel)  Once,   R        07/29/22 0847   07/29/22 0436  Ethanol  Once,   URGENT        07/29/22 0435            Vitals/Pain Today's Vitals   07/29/22 0742 07/29/22 0745 07/29/22 0800 07/29/22 0830  BP:  115/81 127/69 130/76  Pulse:  60 (!) 59 (!) 57  Resp:  '13 20 12  '$ Temp: 97.7 F (36.5 C)     TempSrc: Oral     SpO2:  100% 100% 97%  Weight:      Height:      PainSc:        Isolation Precautions No active isolations  Medications Medications  ondansetron (ZOFRAN) injection 4 mg (4 mg Intravenous Not Given 07/29/22 0501)  ondansetron (ZOFRAN) injection 4 mg (4 mg Intravenous Given 07/29/22 0501)  aspirin chewable tablet 324 mg (324 mg Oral Given 07/29/22 0740)    Mobility manual wheelchair     Focused  Assessments Renal Assessment Handoff:  Hemodialysis Schedule: Hemodialysis Schedule: Monday/Wednesday/Friday Last Hemodialysis date and time: Yesterday   Restricted appendage: left arm   R Recommendations: See Admitting Provider Note  Report given to:   Additional Notes: Patient alert and oriented to surroundings. Daughter at bedside with patient. She states she uses wheelchair at home, as she is not steady on feet.

## 2022-07-29 NOTE — ED Provider Notes (Signed)
[facility] Provider Note   CSN: [id number] Arrival date & time: 07/29/22  0415     History  Chief Complaint  Patient presents with   Emesis    Robin [name] is a 72 y.o. female.  The history is provided by the patient.  Emesis Robin [name] is a 72 y.o. female who presents to the Emergency Department complaining of vomiting.  She presents to the emergency department by EMS from home for evaluation of shortness of breath for 1 week with nausea for 2 weeks and vomiting once daily for 1 week.  She has ESRD and dialyzes Monday Wednesday Friday and has not missed any doses.  EMS was called out tonight due to increased shortness of breath.  She has tried candy for her nausea, and this is not useful.  She states that all of her symptoms started when she had a fall 2 weeks ago due to losing her balance.  Since that time she has had pain in her neck as well as persistent nausea.  No chest pain, abdominal pain, fevers, diarrhea.  She does report a heaviness in her chest at times.     Home Medications Prior to Admission medications   Medication Sig Start Date End Date Taking? Authorizing Provider  acetaminophen (TYLENOL) 325 MG tablet Take 975 mg by mouth 3 (three) times a week.    [provider]  albuterol (VENTOLIN HFA) 108 (90 Base) MCG/ACT inhaler Inhale 1-2 puffs into the lungs every 6 (six) hours as needed for wheezing or shortness of breath.    [provider]  apixaban (ELIQUIS) 5 MG TABS tablet Take 2.5 mg by mouth 2 (two) times daily with a meal.    [provider]  ARIPiprazole (ABILIFY) 2 MG tablet Take 4 mg by mouth at bedtime. Take with 1/2 20 mg tablet for a total dose of 14 mg    [provider]  ARIPiprazole (ABILIFY) 20 MG tablet Take 10 mg by mouth at bedtime. Take with a 4 mg tablet for a total dose of 14 mg    [provider]  atorvastatin (LIPITOR) 80 MG tablet Take 40 mg by mouth at  bedtime.    [provider]  benzonatate (TESSALON) 100 MG capsule Take 100 mg by mouth 3 (three) times daily as needed for cough.    [provider]  calcitRIOL (ROCALTROL) 0.25 MCG capsule Take 0.25 mcg by mouth daily.    [provider]  ferric citrate (AURYXIA) 1 GM 210 MG(Fe) tablet Take 420 mg by mouth 2 (two) times daily with a meal.    [provider]  gabapentin (NEURONTIN) 100 MG capsule Take 100 mg by mouth 2 (two) times daily.    [provider]  insulin aspart (NOVOLOG) 100 UNIT/ML injection Inject 6 Units into the skin 3 (three) times daily as needed for high blood sugar (CBG >300).    [provider]  insulin detemir (LEVEMIR FLEXTOUCH) 100 UNIT/ML FlexPen Inject 35 Units into the skin daily.    [provider]  isosorbide mononitrate (IMDUR) 60 MG 24 hr tablet Take 60 mg by mouth daily with breakfast.    [provider]  liraglutide (VICTOZA) 18 MG/3ML SOPN Inject 1.2 mg into the skin daily with breakfast.    [provider]  lisinopril (ZESTRIL) 5 MG tablet Take 2.5 mg by mouth daily. 09/18/20   [provider]  Melatonin 10 MG TABS Take 10  mg by mouth at bedtime.    [provider]  metoprolol (TOPROL-XL) 200 MG 24 hr tablet Take 200 mg by mouth daily.    [provider]  multivitamin (RENA-VIT) TABS tablet Take 1 tablet by mouth at bedtime.  12/11/17   [provider]  nitroGLYCERIN (NITROSTAT) 0.4 MG SL tablet Place 0.4 mg under the tongue every 5 (five) minutes as needed for chest pain.    [provider]  pantoprazole (PROTONIX) 40 MG tablet Take 40 mg by mouth 2 (two) times daily before a meal.    [provider]  Tiotropium Bromide Monohydrate (SPIRIVA RESPIMAT) 1.25 MCG/ACT AERS Inhale 2 puffs into the lungs every morning.    [provider]  traZODone (DESYREL) 50 MG tablet Take 25 mg by mouth at bedtime.    [provider]       Allergies    Patient has no known allergies.    Review of Systems   Review of Systems  Gastrointestinal:  Positive for vomiting.  All other systems reviewed and are negative.   Physical Exam Updated Vital Signs BP 123/87   Pulse 80   Temp 97.8 F (36.6 C) (Oral)   Resp 16   Ht '5\' 4"'$  (1.626 m)   Wt 63 kg   SpO2 94%   BMI 23.84 kg/m  Physical Exam Vitals and nursing note reviewed.  Constitutional:      Appearance: She is well-developed.  HENT:     Head: Normocephalic and atraumatic.  Cardiovascular:     Rate and Rhythm: Normal rate and regular rhythm.     Heart sounds: No murmur heard. Pulmonary:     Effort: Pulmonary effort is normal. No respiratory distress.     Breath sounds: Normal breath sounds.  Abdominal:     Palpations: Abdomen is soft.     Tenderness: There is no abdominal tenderness. There is no guarding or rebound.  Musculoskeletal:        General: No tenderness.     Comments: Trace nonpitting edema to bilateral lower extremities.  2+ DP pulses bilaterally.  There is a right transmetatarsal amputation that is healing.  Skin:    General: Skin is warm and dry.  Neurological:     Mental Status: She is alert and oriented to person, place, and time.     Comments: Global weakness  Psychiatric:        Behavior: Behavior normal.     ED Results / Procedures / Treatments   Labs (all labs ordered are listed, but only abnormal results are displayed) Labs Reviewed  LIPASE, BLOOD - Abnormal; Notable for the following components:      Result Value   Lipase 52 (*)    All other components within normal limits  COMPREHENSIVE METABOLIC PANEL - Abnormal; Notable for the following components:   Chloride 96 (*)    Glucose, Bld 171 (*)    BUN 25 (*)    Creatinine, Ser 4.06 (*)    Albumin 3.1 (*)    Alkaline Phosphatase 210 (*)    GFR, Estimated 11 (*)    All other components within normal limits  BRAIN NATRIURETIC PEPTIDE - Abnormal; Notable for the following  components:   B Natriuretic Peptide 1,147.2 (*)    All other components within normal limits  CBC WITH DIFFERENTIAL/PLATELET - Abnormal; Notable for the following components:   Hemoglobin 11.9 (*)    RDW 19.8 (*)    All other components within normal limits  TROPONIN  I (HIGH SENSITIVITY) - Abnormal; Notable for the following components:   Troponin I (High Sensitivity) 200 (*)    All other components within normal limits  ETHANOL  TROPONIN I (HIGH SENSITIVITY)    EKG EKG Interpretation  Date/Time:  Tuesday July 29 2022 04:27:16 EST Ventricular Rate:  63 PR Interval:  170 QRS Duration: 91 QT Interval:  484 QTC Calculation: 496 R Axis:   -46 Text Interpretation: Sinus rhythm Supraventricular bigeminy Left anterior fascicular block Abnormal R-wave progression, early transition Repol abnrm suggests ischemia, diffuse leads Confirmed by Quintella Reichert [phone]) on 07/29/2022 4:35:08 AM  Radiology CT Cervical Spine Wo Contrast  Result Date: 07/29/2022 CLINICAL DATA:  72 year old female status post fall 2 weeks ago with persistent dizziness, nausea. Vomiting and decreased appetite. Dialysis patient. EXAM: CT CERVICAL SPINE WITHOUT CONTRAST TECHNIQUE: Multidetector CT imaging of the cervical spine was performed without intravenous contrast. Multiplanar CT image reconstructions were also generated. RADIATION DOSE REDUCTION: This exam was performed according to the departmental dose-optimization program which includes automated exposure control, adjustment of the mA and/or kV according to patient size and/or use of iterative reconstruction technique. COMPARISON:  Head CT today reported separately. Cervical spine CT 10/27/2020. FINDINGS: Alignment: Chronic anterolisthesis of C4 on C5. Chronic straightening of cervical lordosis. Less reversal compared to 2022. Cervicothoracic junction alignment is within normal limits. Bilateral posterior element alignment is within normal limits. Skull base and  vertebrae: Stable bone mineralization. Visualized skull base is intact. No atlanto-occipital dissociation. C1 and C2 appear intact and aligned. Chronic endplate erosions [id number], C5-C6. Chronic C6 inferior endplate compression. Unilateral chronic C4-C5 facet erosion. Suspect chronic dialysis related spondyloarthropathy. No acute osseous abnormality identified. Soft tissues and spinal canal: No prevertebral fluid or swelling. No visible canal hematoma. Disc levels: Chronic degeneration C4-C5 through C6-C7. Mild multifactorial spinal stenosis suspected at C4-C5. Upper chest: Osteopenia. No acute osseous abnormality identified. Negative lung apices. Small volume retained gas and secretions in the upper thoracic esophagus. IMPRESSION: 1. No acute traumatic injury identified in the cervical spine. 2. 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 Mild multifactorial spinal stenosis suspected at C4-C5. Electronically Signed   By: Genevie Ann M.D.   On: 07/29/2022 06:03   CT Head Wo Contrast  Result Date: 07/29/2022 CLINICAL DATA:  72 year old female status post fall 2 weeks ago with persistent dizziness, nausea. Vomiting and decreased appetite. Dialysis patient. EXAM: CT HEAD WITHOUT CONTRAST TECHNIQUE: Contiguous axial images were obtained from the base of the skull through the vertex without intravenous contrast. RADIATION DOSE REDUCTION: This exam was performed according to the departmental dose-optimization program which includes automated exposure control, adjustment of the mA and/or kV according to patient size and/or use of iterative reconstruction technique. COMPARISON:  Head CT 10/27/2020.  Brain MRI 07/27/2020. FINDINGS: Brain: Stable cerebral volume. No midline shift, ventriculomegaly, mass effect, evidence of mass lesion, intracranial hemorrhage or evidence of cortically based acute infarction. Stable and normal for age gray-white differentiation. No convincing encephalomalacia. Vascular:  Extensive Calcified atherosclerosis at the skull base. No suspicious intracranial vascular hyperdensity. Skull: No acute osseous abnormality identified. Sinuses/Orbits: Visualized paranasal sinuses and mastoids are stable and well aerated. Other: Calcified scalp vessel atherosclerosis. No orbit or scalp soft tissue injury identified. IMPRESSION: 1. No recent traumatic injury identified. Stable and normal for age noncontrast CT appearance of the brain. 2. Advanced calcified atherosclerosis compatible with End stage renal disease. Electronically Signed   By: Genevie Ann M.D.   On: 07/29/2022 05:58   DG Chest Port 1  View  Result Date: 07/29/2022 CLINICAL DATA:  72 year old female with history of shortness of breath. EXAM: PORTABLE CHEST 1 VIEW COMPARISON:  Chest x-ray 04/27/2022. FINDINGS: Lung volumes are normal. No consolidative airspace disease. No pleural effusions. No pneumothorax. No evidence of pulmonary edema. Heart size is mildly enlarged (unchanged). Upper mediastinal contours are within normal limits. Atherosclerotic calcifications are noted in the thoracic aorta. Vascular stent projecting over the left axillary region. IMPRESSION: 1. No radiographic evidence of acute cardiopulmonary disease. 2. Mild cardiomegaly. 3. Aortic atherosclerosis. Electronically Signed   By: Vinnie Langton M.D.   On: 07/29/2022 05:16    Procedures Procedures    Medications Ordered in ED Medications  ondansetron (ZOFRAN) injection 4 mg (4 mg Intravenous Not Given 07/29/22 0501)  aspirin chewable tablet 324 mg (has no administration in time range)  ondansetron (ZOFRAN) injection 4 mg (4 mg Intravenous Given 07/29/22 0501)    ED Course/ Medical Decision Making/ A&P                             Medical Decision Making Amount and/or Complexity of Data Reviewed Labs: ordered. Radiology: ordered.  Risk OTC drugs. Prescription drug management.   Patient with history of ESRD, hypertension, diabetes, coronary artery  disease, PE on anticoagulation here for evaluation of nausea, occasional vomiting, shortness of breath and chest discomfort for the last 1 to 2 weeks.  She is nontoxic-appearing on evaluation with global weakness.  Given her reported fall and anticoagulation a CT head and C-spine were obtained, which are negative for acute abnormality.  Chest x-ray with no acute abnormality.  EKG does have some ischemic changes, these have been present on prior EKGs but not on most recent EKG.  Troponin is elevated at 200, slightly increased from her baseline which is about 40-50.  Her renal function is at her baseline.  Given her chest pressure, elevated troponin recommend observation admission.  Medicine consulted for admission for ongoing care.        Final Clinical Impression(s) / ED Diagnoses Final diagnoses:  None    Rx / DC Orders ED Discharge Orders     None         Quintella Reichert, MD 07/29/22 [phone]

## 2022-07-29 NOTE — Plan of Care (Signed)
  Problem: Education: Goal: Knowledge of General Education information will improve Description: Including pain rating scale, medication(s)/side effects and non-pharmacologic comfort measures Outcome: Progressing   Problem: Health Behavior/Discharge Planning: Goal: Ability to manage health-related needs will improve Outcome: Progressing   Problem: Clinical Measurements: Goal: Cardiovascular complication will be avoided Outcome: Progressing   Problem: Pain Managment: Goal: General experience of comfort will improve Outcome: Progressing   Problem: Safety: Goal: Ability to remain free from injury will improve Outcome: Progressing   Problem: Skin Integrity: Goal: Risk for impaired skin integrity will decrease Outcome: Progressing   

## 2022-07-29 NOTE — ED Triage Notes (Signed)
Pt states she has been nauseous and vomiting as well as decreased appetite. Taking all medications, and going to dialysis. Last BP 117/72 HR 66 SPO2 100% RA RR 16 CBG 193  A&ox4

## 2022-07-30 DIAGNOSIS — R0789 Other chest pain: Secondary | ICD-10-CM | POA: Diagnosis not present

## 2022-07-30 DIAGNOSIS — E440 Moderate protein-calorie malnutrition: Secondary | ICD-10-CM | POA: Diagnosis present

## 2022-07-30 DIAGNOSIS — I959 Hypotension, unspecified: Secondary | ICD-10-CM | POA: Diagnosis not present

## 2022-07-30 DIAGNOSIS — Z89431 Acquired absence of right foot: Secondary | ICD-10-CM | POA: Diagnosis not present

## 2022-07-30 DIAGNOSIS — E1122 Type 2 diabetes mellitus with diabetic chronic kidney disease: Secondary | ICD-10-CM | POA: Diagnosis present

## 2022-07-30 DIAGNOSIS — Z794 Long term (current) use of insulin: Secondary | ICD-10-CM | POA: Diagnosis not present

## 2022-07-30 DIAGNOSIS — F319 Bipolar disorder, unspecified: Secondary | ICD-10-CM | POA: Diagnosis present

## 2022-07-30 DIAGNOSIS — E11649 Type 2 diabetes mellitus with hypoglycemia without coma: Secondary | ICD-10-CM | POA: Diagnosis present

## 2022-07-30 DIAGNOSIS — E1140 Type 2 diabetes mellitus with diabetic neuropathy, unspecified: Secondary | ICD-10-CM | POA: Diagnosis present

## 2022-07-30 DIAGNOSIS — D631 Anemia in chronic kidney disease: Secondary | ICD-10-CM | POA: Diagnosis present

## 2022-07-30 DIAGNOSIS — I5032 Chronic diastolic (congestive) heart failure: Secondary | ICD-10-CM | POA: Diagnosis not present

## 2022-07-30 DIAGNOSIS — F17210 Nicotine dependence, cigarettes, uncomplicated: Secondary | ICD-10-CM | POA: Diagnosis not present

## 2022-07-30 DIAGNOSIS — J449 Chronic obstructive pulmonary disease, unspecified: Secondary | ICD-10-CM | POA: Diagnosis present

## 2022-07-30 DIAGNOSIS — F250 Schizoaffective disorder, bipolar type: Secondary | ICD-10-CM | POA: Diagnosis present

## 2022-07-30 DIAGNOSIS — E785 Hyperlipidemia, unspecified: Secondary | ICD-10-CM | POA: Diagnosis not present

## 2022-07-30 DIAGNOSIS — E1151 Type 2 diabetes mellitus with diabetic peripheral angiopathy without gangrene: Secondary | ICD-10-CM | POA: Diagnosis present

## 2022-07-30 DIAGNOSIS — R7989 Other specified abnormal findings of blood chemistry: Secondary | ICD-10-CM | POA: Diagnosis not present

## 2022-07-30 DIAGNOSIS — N186 End stage renal disease: Secondary | ICD-10-CM | POA: Diagnosis present

## 2022-07-30 DIAGNOSIS — I480 Paroxysmal atrial fibrillation: Secondary | ICD-10-CM | POA: Diagnosis present

## 2022-07-30 DIAGNOSIS — E669 Obesity, unspecified: Secondary | ICD-10-CM | POA: Diagnosis present

## 2022-07-30 DIAGNOSIS — I2510 Atherosclerotic heart disease of native coronary artery without angina pectoris: Secondary | ICD-10-CM | POA: Diagnosis not present

## 2022-07-30 DIAGNOSIS — Z992 Dependence on renal dialysis: Secondary | ICD-10-CM | POA: Diagnosis not present

## 2022-07-30 DIAGNOSIS — R079 Chest pain, unspecified: Secondary | ICD-10-CM | POA: Diagnosis present

## 2022-07-30 DIAGNOSIS — I132 Hypertensive heart and chronic kidney disease with heart failure and with stage 5 chronic kidney disease, or end stage renal disease: Secondary | ICD-10-CM | POA: Diagnosis present

## 2022-07-30 DIAGNOSIS — I214 Non-ST elevation (NSTEMI) myocardial infarction: Secondary | ICD-10-CM | POA: Diagnosis present

## 2022-07-30 DIAGNOSIS — I491 Atrial premature depolarization: Secondary | ICD-10-CM | POA: Diagnosis not present

## 2022-07-30 DIAGNOSIS — E1169 Type 2 diabetes mellitus with other specified complication: Secondary | ICD-10-CM | POA: Diagnosis present

## 2022-07-30 DIAGNOSIS — Z95820 Peripheral vascular angioplasty status with implants and grafts: Secondary | ICD-10-CM | POA: Diagnosis not present

## 2022-07-30 DIAGNOSIS — I5042 Chronic combined systolic (congestive) and diastolic (congestive) heart failure: Secondary | ICD-10-CM | POA: Diagnosis present

## 2022-07-30 DIAGNOSIS — F0393 Unspecified dementia, unspecified severity, with mood disturbance: Secondary | ICD-10-CM | POA: Diagnosis present

## 2022-07-30 DIAGNOSIS — I25110 Atherosclerotic heart disease of native coronary artery with unstable angina pectoris: Secondary | ICD-10-CM | POA: Diagnosis present

## 2022-07-30 LAB — GLUCOSE, CAPILLARY
Glucose-Capillary: 49 mg/dL — ABNORMAL LOW (ref 70–99)
Glucose-Capillary: 70 mg/dL (ref 70–99)
Glucose-Capillary: 88 mg/dL (ref 70–99)
Glucose-Capillary: 148 mg/dL — ABNORMAL HIGH (ref 70–99)
Glucose-Capillary: 194 mg/dL — ABNORMAL HIGH (ref 70–99)
Glucose-Capillary: 300 mg/dL — ABNORMAL HIGH (ref 70–99)

## 2022-07-30 LAB — BASIC METABOLIC PANEL
Anion gap: 14 (ref 5–15)
BUN: 17 mg/dL (ref 8–23)
CO2: 28 mmol/L (ref 22–32)
Calcium: 9.4 mg/dL (ref 8.9–10.3)
Chloride: 93 mmol/L — ABNORMAL LOW (ref 98–111)
Creatinine, Ser: 3.14 mg/dL — ABNORMAL HIGH (ref 0.44–1.00)
GFR, Estimated: 15 mL/min — ABNORMAL LOW (ref >60)
Glucose, Bld: 209 mg/dL — ABNORMAL HIGH (ref 70–99)
Potassium: 3.8 mmol/L (ref 3.5–5.1)
Sodium: 135 mmol/L (ref 135–145)

## 2022-07-30 LAB — RENAL FUNCTION PANEL
Albumin: 3 g/dL — ABNORMAL LOW (ref 3.5–5.0)
Anion gap: 19 — ABNORMAL HIGH (ref 5–15)
BUN: 40 mg/dL — ABNORMAL HIGH (ref 8–23)
CO2: 22 mmol/L (ref 22–32)
Calcium: 9.3 mg/dL (ref 8.9–10.3)
Chloride: 96 mmol/L — ABNORMAL LOW (ref 98–111)
Creatinine, Ser: 5.54 mg/dL — ABNORMAL HIGH (ref 0.44–1.00)
GFR, Estimated: 8 mL/min — ABNORMAL LOW (ref >60)
Glucose, Bld: 48 mg/dL — ABNORMAL LOW (ref 70–99)
Phosphorus: 6 mg/dL — ABNORMAL HIGH (ref 2.5–4.6)
Potassium: 3.6 mmol/L (ref 3.5–5.1)
Sodium: 137 mmol/L (ref 135–145)

## 2022-07-30 LAB — CBC
HCT: 37.5 % (ref 36.0–46.0)
Hemoglobin: 11.6 g/dL — ABNORMAL LOW (ref 12.0–15.0)
MCH: 29.1 pg (ref 26.0–34.0)
MCHC: 30.9 g/dL (ref 30.0–36.0)
MCV: 94.2 fL (ref 80.0–100.0)
Platelets: 171 10*3/uL (ref 150–400)
RBC: 3.98 MIL/uL (ref 3.87–5.11)
RDW: 19.3 % — ABNORMAL HIGH (ref 11.5–15.5)
WBC: 4.3 10*3/uL (ref 4.0–10.5)
nRBC: 0 % (ref 0.0–0.2)

## 2022-07-30 LAB — APTT
aPTT: >200 seconds (ref 24–36)
aPTT: 87 seconds — ABNORMAL HIGH (ref 24–36)
aPTT: 105 seconds — ABNORMAL HIGH (ref 24–36)

## 2022-07-30 LAB — HEPARIN LEVEL (UNFRACTIONATED): Heparin Unfractionated: >1.1 IU/mL — ABNORMAL HIGH (ref 0.30–0.70)

## 2022-07-30 LAB — HEPATITIS B SURFACE ANTIBODY, QUANTITATIVE: Hep B S AB Quant (Post): 6.3 m[IU]/mL — ABNORMAL LOW (ref >9.9)

## 2022-07-30 LAB — HEMOGLOBIN A1C
Hgb A1c MFr Bld: 7.7 % — ABNORMAL HIGH (ref 4.8–5.6)
Mean Plasma Glucose: 174 mg/dL

## 2022-07-30 MED ORDER — ISOSORBIDE MONONITRATE ER 30 MG PO TB24
30.0000 mg | ORAL_TABLET | Freq: Every day | ORAL | Status: DC
  Administered 2022-07-31 – 2022-08-03 (×2): 30 mg via ORAL
  Filled 2022-07-30 (×4): qty 1

## 2022-07-30 MED ORDER — INSULIN DETEMIR 100 UNIT/ML ~~LOC~~ SOLN
10.0000 [IU] | Freq: Every day | SUBCUTANEOUS | Status: DC
  Administered 2022-07-30 – 2022-08-02 (×4): 10 [IU] via SUBCUTANEOUS
  Filled 2022-07-30 (×6): qty 0.1

## 2022-07-30 MED ORDER — METOPROLOL SUCCINATE ER 25 MG PO TB24
25.0000 mg | ORAL_TABLET | Freq: Every day | ORAL | Status: DC
  Administered 2022-07-31 – 2022-08-05 (×5): 25 mg via ORAL
  Filled 2022-07-30 (×6): qty 1

## 2022-07-30 MED ORDER — GABAPENTIN 100 MG PO CAPS
100.0000 mg | ORAL_CAPSULE | Freq: Two times a day (BID) | ORAL | Status: DC
  Administered 2022-07-30 – 2022-08-05 (×13): 100 mg via ORAL
  Filled 2022-07-30 (×13): qty 1

## 2022-07-30 MED ORDER — SODIUM CHLORIDE 0.9 % IV SOLN
125.0000 mg | INTRAVENOUS | Status: DC
  Administered 2022-07-30 – 2022-08-04 (×3): 125 mg via INTRAVENOUS
  Filled 2022-07-30 (×5): qty 10

## 2022-07-30 NOTE — Progress Notes (Signed)
  Date and time results received: 07/30/22 819 (use smartphrase ".now" to insert current time)  Test: aPTT Critical Value: >200 seconds  Name of Provider Notified: [facility] orders

## 2022-07-30 NOTE — Progress Notes (Incomplete Revision)
HD#0 SUBJECTIVE:  Patient Summary: Robin [name] is a 72 y.o. 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   Overnight Events: Hypoglycemic overnight. Patient's sugar was 49 but improved to 70 this morning with juice. Patient eating breakfast in bed and reports good appetite. She denies any chest pain today but felt chest pressure during the night. She also reports lower back pain at midline and states that she has had this pain for about a week. She reports that when she fell last week but states that her back pain did not start after the fall. Tylenol seems to alleviate the pain. She also feels pulsating/sharp pains in both of her feet.    OBJECTIVE:  Vital Signs: Vitals:   07/30/22 0900 07/30/22 0930 07/30/22 1000 07/30/22 1030  BP: (!) 150/70 (!) 163/96 (!) 142/89 123/66  Pulse: 68 95 78 68  Resp: '16 13 17 11  '$ Temp:      TempSrc:      SpO2: 99% 99% (!) 21% 98%  Weight:      Height:       Supplemental O2: Room Air SpO2: 98 %  Filed Weights   07/29/22 0422 07/30/22 0827  Weight: 63 kg 87.1 kg     Intake/Output Summary (Last 24 hours) at 07/30/2022 1101 Last data filed at 07/30/2022 1000 Gross per 24 hour  Intake 698.08 ml  Output 1200 ml  Net -501.92 ml   Net IO Since Admission: -501.92 mL [07/30/22 1101]  Physical Exam: Physical Exam Constitutional:      General: She is not in acute distress.    Appearance: Normal appearance. She is obese.  HENT:     Head: Normocephalic and atraumatic.     Mouth/Throat:     Mouth: Mucous membranes are moist.  Cardiovascular:     Rate and Rhythm: Normal rate and regular rhythm.     Pulses:          Radial pulses are 2+ on the right side and 2+ on the left side.  Pulmonary:     Effort: Pulmonary effort  is normal. No respiratory distress.     Breath sounds: Normal breath sounds.  Abdominal:     General: Bowel sounds are normal. There is no distension.     Palpations: Abdomen is soft.     Tenderness: There is no abdominal tenderness.  Musculoskeletal:     Comments: Tenderness to palpation of the midline lumbar region, no deformity noted, no paraspinal muscle tenderness   Skin:    General: Skin is warm and dry.  Neurological:     General: No focal deficit present.     Mental Status: She is alert and oriented to person, place, and time.  Psychiatric:        Mood and Affect: Mood normal.        Behavior: Behavior normal.     Patient Lines/Drains/Airways Status     Active Line/Drains/Airways     Name Placement date Placement time Site Days   Peripheral IV 07/29/22 22 G Posterior;Right Hand 07/29/22  0446  Hand  1   Fistula / Graft Left Upper arm Arteriovenous fistula 09/30/14  --  Upper arm  2860   Fistula / Graft Left Upper arm Arteriovenous fistula --  --  Upper arm  --  Fistula / Graft Left Upper arm --  --  Upper arm  --   External Urinary Catheter 07/29/22  1930  --  1   Incision (Closed) 03/16/15 Leg Left 03/16/15  1346  -- 2693   Incision (Closed) 08/06/16 Foot Right 08/06/16  0915  -- 2184   Incision (Closed) 09/09/16 Foot Right 09/09/16  0924  -- 2150            Pertinent Labs:    Latest Ref Rng & Units 07/30/2022    5:59 AM 07/29/2022    4:43 AM 04/27/2022    5:35 PM  CBC  WBC 4.0 - 10.5 K/uL 4.3  6.0  8.6   Hemoglobin 12.0 - 15.0 g/dL 11.6  11.9  12.1   Hematocrit 36.0 - 46.0 % 37.5  37.5  37.9   Platelets 150 - 400 K/uL 171  184  171        Latest Ref Rng & Units 07/30/2022    5:59 AM 07/29/2022    4:43 AM 04/27/2022    5:35 PM  CMP  Glucose 70 - 99 mg/dL 48  171  109   BUN 8 - 23 mg/dL 40  25  32   Creatinine 0.44 - 1.00 mg/dL 5.54  4.06  7.58   Sodium 135 - 145 mmol/L 137  136  136   Potassium 3.5 - 5.1 mmol/L 3.6  3.7  4.2   Chloride 98 - 111 mmol/L 96   96  94   CO2 22 - 32 mmol/L '22  25  21   '$ Calcium 8.9 - 10.3 mg/dL 9.3  9.4  10.6   Total Protein 6.5 - 8.1 g/dL  7.0  7.5   Total Bilirubin 0.3 - 1.2 mg/dL  0.7  1.4   Alkaline Phos 38 - 126 U/L  210  97   AST 15 - 41 U/L  31  34   ALT 0 - 44 U/L  19  18     Recent Labs    07/30/22 0604 07/30/22 0632 07/30/22 0709  GLUCAP 49* 70 88     Pertinent Imaging: ECHOCARDIOGRAM COMPLETE  Result Date: 07/29/2022    ECHOCARDIOGRAM REPORT   Patient Name:   [name] Date of Exam: 07/29/2022 Medical Rec #:  [id number]    Height:       64.0 in Accession #:    [id number]   Weight:       138.9 lb Date of Birth:  1950-12-10    BSA:          1.676 m Patient Age:    39 years     BP:           128/67 mmHg Patient Gender: F            HR:           71 bpm. Exam Location:  Inpatient Procedure: 2D Echo, Cardiac Doppler and Color Doppler Indications:    Chest Pain R07.9                 Elevated Troponin  History:        Patient has prior history of Echocardiogram examinations, most                 recent 04/15/2019. CHF, Arrythmias:Tachycardia,                 Signs/Symptoms:Murmur and Chest Pain; Risk Factors:Dyslipidemia,  Sleep Apnea, Hypertension and Current Smoker.  Sonographer:    Greer Pickerel Referring Phys: [id number] ELIZABETH REES  Sonographer Comments: Image acquisition challenging due to respiratory motion. IMPRESSIONS  1. Left ventricular ejection fraction, by estimation, is 45 to 50%. The left ventricle has mildly decreased function. The left ventricle demonstrates global hypokinesis. There is mild concentric left ventricular hypertrophy. Left ventricular diastolic parameters are consistent with Grade II diastolic dysfunction (pseudonormalization).  2. Right ventricular systolic function is mildly reduced. The right ventricular size is mildly enlarged. There is mildly elevated pulmonary artery systolic pressure. The estimated right ventricular systolic pressure is [id number].  3. The mitral valve  is grossly normal. Trivial mitral valve regurgitation. No evidence of mitral stenosis.  4. The aortic valve is tricuspid. Aortic valve regurgitation is not visualized. Aortic valve sclerosis is present, with no evidence of aortic valve stenosis.  5. The inferior vena cava is dilated in size with <50% respiratory variability, suggesting right atrial pressure of 15 mmHg. FINDINGS  Left Ventricle: Left ventricular ejection fraction, by estimation, is 45 to 50%. The left ventricle has mildly decreased function. The left ventricle demonstrates global hypokinesis. The left ventricular internal cavity size was normal in size. There is  mild concentric left ventricular hypertrophy. Left ventricular diastolic parameters are consistent with Grade II diastolic dysfunction (pseudonormalization). Right Ventricle: The right ventricular size is mildly enlarged. No increase in right ventricular wall thickness. Right ventricular systolic function is mildly reduced. There is mildly elevated pulmonary artery systolic pressure. The tricuspid regurgitant  velocity is 2.55 m/s, and with an assumed right atrial pressure of 15 mmHg, the estimated right ventricular systolic pressure is [id number] mmHg. Left Atrium: Left atrial size was normal in size. Right Atrium: Right atrial size was normal in size. Pericardium: There is no evidence of pericardial effusion. Mitral Valve: The mitral valve is grossly normal. Trivial mitral valve regurgitation. No evidence of mitral valve stenosis. Tricuspid Valve: The tricuspid valve is grossly normal. Tricuspid valve regurgitation is trivial. No evidence of tricuspid stenosis. Aortic Valve: The aortic valve is tricuspid. Aortic valve regurgitation is not visualized. Aortic valve sclerosis is present, with no evidence of aortic valve stenosis. Pulmonic Valve: The pulmonic valve was grossly normal. Pulmonic valve regurgitation is trivial. No evidence of pulmonic stenosis. Aorta: The aortic root and ascending  aorta are structurally normal, with no evidence of dilitation. Venous: The inferior vena cava is dilated in size with less than 50% respiratory variability, suggesting right atrial pressure of 15 mmHg. IAS/Shunts: The atrial septum is grossly normal.  LEFT VENTRICLE PLAX 2D LVIDd:         3.90 cm      Diastology LVIDs:         3.40 cm      LV e' medial:    3.65 cm/s LV PW:         1.20 cm      LV E/e' medial:  27.7 LV IVS:        1.30 cm      LV e' lateral:   5.27 cm/s LVOT diam:     1.70 cm      LV E/e' lateral: 19.2 LV SV:         39 LV SV Index:   23 LVOT Area:     2.27 cm  LV Volumes (MOD) LV vol d, MOD A2C: 123.0 ml LV vol d, MOD A4C: 95.1 ml LV vol s, MOD A2C: 77.7 ml LV vol s, MOD A4C: 67.8 ml  LV SV MOD A2C:     45.3 ml LV SV MOD A4C:     95.1 ml LV SV MOD BP:      34.7 ml RIGHT VENTRICLE RV S prime:     7.05 cm/s TAPSE (M-mode): 1.3 cm LEFT ATRIUM             Index        RIGHT ATRIUM           Index LA diam:        3.00 cm 1.79 cm/m   RA Area:     27.40 cm LA Vol (A2C):   43.1 ml 25.72 ml/m  RA Volume:   91.10 ml  54.37 ml/m LA Vol (A4C):   47.1 ml 28.11 ml/m LA Biplane Vol: 46.2 ml 27.57 ml/m  AORTIC VALVE             PULMONIC VALVE LVOT Vmax:   80.90 cm/s  PR End Diast Vel: 5.34 msec LVOT Vmean:  49.300 cm/s LVOT VTI:    0.171 m  AORTA Ao Root diam: 3.10 cm Ao Asc diam:  3.40 cm MITRAL VALVE                TRICUSPID VALVE MV Area (PHT): 3.65 cm     TR Peak grad:   26.0 mmHg MV Decel Time: 208 msec     TR Vmax:        255.00 cm/s MV E velocity: 101.00 cm/s MV A velocity: 87.90 cm/s   SHUNTS MV E/A ratio:  1.15         Systemic VTI:  0.17 m                             Systemic Diam: 1.70 cm Eleonore Chiquito MD Electronically signed by Eleonore Chiquito MD Signature Date/Time: 07/29/2022/1:28:42 PM    Final     ASSESSMENT/PLAN:  Assessment: Principal Problem:   Chest pain Active Problems:   NSTEMI (non-ST elevated myocardial infarction) (HCC)   PAC (premature atrial contraction)   Hyperlipidemia with  target LDL less than 70   Robin [name] is a 71 y.o. 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 Currently on hospital day 1.  Plan: #NSTEMI Hx of CAD s/p PCI Has significant cardiac hx w/ prior stent placement. Most recent cath in 2019 w/  non-obstructive disease in the LAD/RCA, circumflex heavily calcified with stenosis. It was recommended to continue medical therapy due to difficult PCI w/ calcification/ prior stent in this area. Presented w/ constant central chest pressure radiating to the L neck. EKG w/ more prominent TWI in the inferior leads and QRS changes in V2/V3. Initial troponin elevated at 200, flat at 167. Echo here demonstrates EF 45-50% with global hypokinesis of the LV w/ G2DD. Heparin drip started yesterday. Patient denies chest pain today. Medical therapy recommended by cardiology at this time.  -Appreciate cardiology recommendations  -Continue atorvastatin 40 mg daily -Continue heparin GGT -Metoprolol 12.5 mg daily -Imdur 15 mg daily -Nitro PRN -Telemetry  #Hx of combined systolic and diastolic HF Echo here demonstrates EF 45-50% with global hypokinesis of the LV w/ G2DD. GDMT limited by ESRD. No signs of volume overload on exam.    #Hx of atrial fibrillation On eliquis  at home. Remains in sinus rhythm.  -Continue heparin ggt for NSTEMI   #ESRD on HD (MWF) No missed HD sessions as outpatient. Plan for HD today.  -Appreciate -Continue ferric citrate 520 mg BID  #Back pain Has tenderness to palpation of the midline lumbar spine without deformities noted. Suspect likely musculoskeletal in nature. Will add lidocaine patch to regimen and reassess.  -Start Lidocaine patch   #Feet pain Describes this pain as similar to her history of neuropathy  in the setting of T2DM. Will restart her home gabapentin. -Start gabapentin '100mg'$  BID   #Type 2 DM Most recent A1c in 04/2022 of 7.0%. Hypoglycemic in the 40s overnight. Improved to 80s w/ juice. Patient eating well. Will decrease levemir to 10 U daily.  -Continue SSI -Decrease levemir to 10 U QHS -Trend CBGs   #PAD Has hx of bilateral LE arterial disease. S/p LLE SFA stent in 2023. Hospitalized last year for infectious/ischemic disease requiring right TMA and left 1st toe amputation. -Continue atorvastatin 40 mg daily -Continue clopidogrel 75 mg daily   #Schizoaffective & bipolar disorder #Dementia Follows w/ [facility] mental health as outpatient. Taking Abilify 12 mg daily.  -Continue Abilify 12 mg daily -Outpatient f/u w/ [facility] mental health   #Hx of pulmonary embolism and DVT No extremity pain, increased oxygen requirements, or tachycardia. Low suspicion for DVT/PE at this time.  -Continue IV anticoagulation, transition back to home dosing once stable   #OSA Does not wear CPAP at home.    #GERD -Continue home Protonix 40 mg BID   #HLD -Continue home atorvastatin 40 mg daily   #HTN Holding Lisinopril 2.5 mg for now. Will reassess BP following HD.   Diet: Renal VTE: heparin ggt IVF: None Code: Full PT/OT recs: pending   Prior to Admission Living Arrangement: Home w/ family Anticipated Discharge Location: TBD Barriers to Discharge: continued management  Dispo: Anticipated discharge in approximately less than 2 day(s).    Signature: Marisa Cyphers, Medical Student   Please contact the on call pager after 5 pm and on weekends at [phone].   Attestation for Student Documentation:  I personally was present and performed or re-performed the history, physical exam and medical decision-making activities of this service and have verified that the service and findings are accurately documented in the student's note.  Starlyn Skeans, MD 07/30/2022, 1:50 PM

## 2022-07-30 NOTE — Hospital Course (Addendum)
Robin [name] is a 72 y.o. 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 Currently on hospital day 1.   #NSTEMI Hx of CAD s/p PCI Has significant cardiac hx w/ prior stent placement. Most recent cath in 2019 w/  non-obstructive disease in the LAD/RCA, circumflex heavily calcified with stenosis. It was recommended to continue medical therapy due to difficult PCI w/ calcification/ prior stent in this area. Presented w/ constant central chest pressure radiating to the L neck. EKG w/ more prominent TWI in the inferior leads and QRS changes in V2/V3. Initial troponin elevated at 200, flat at 167. Echo here demonstrates EF 45-50% with global hypokinesis of the LV w/ G2DD. Heparin drip started yesterday. Patient denies chest pain today. Medical therapy recommended by cardiology at this time.  -Appreciate cardiology recommendations  -Continue atorvastatin 40 mg daily -Continue heparin GGT -Nitro PRN -Telemetry  Had chest pain radiating to the back overnight, improved with nitroglycerin. Repeat EKG w/o ST elevations. Demonstrated ST depression in lateral leads similar to prior w/ prolonged Qtc. Repeat troponin elevated at 81 but decreased from prior. Given aspirin 81 mg 1x. Underwent left heart cath with cardiology today. Plan to continue with medical therapy.     -Appreciate cardiology recommendations  -Continue metoprolol succinate '25mg'$  daily and imdur '30mg'$  daily  -Increase atorvastatin to 80 mg daily -Continue heparin GGT -Nitro PRN -Telemetry   #Hx of combined systolic and diastolic HF Echo here demonstrates EF 45-50% with global hypokinesis of the LV w/ G2DD. GDMT limited by ESRD. No signs of volume overload on exam.    #Hx of atrial fibrillation On eliquis at home. Remains  in sinus rhythm.  -Continue heparin ggt for NSTEMI   #ESRD on HD (MWF) No missed HD sessions as outpatient. Plan for HD today.  -Appreciate -Continue ferric citrate 520 mg BID   #Back pain Has tenderness to palpation of the midline lumbar spine without deformities noted. Suspect likely musculoskeletal in nature. Will add lidocaine patch to regimen and reassess.  -Start Lidocaine patch    #Feet pain Describes this pain as similar to her history of neuropathy in the setting of T2DM. Will restart her home gabapentin. -Start gabapentin '100mg'$  BID   #Type 2 DM Most recent A1c in 04/2022 of 7.0%. Hypoglycemic in the 40s overnight. Improved to 80s w/ juice. Patient eating well. Will decrease levemir to 10 U daily.  -Continue SSI -Decrease levemir to 10 U QHS -Trend CBGs   #PAD Has hx of bilateral LE arterial disease. S/p LLE SFA stent in 2023. Hospitalized last year for infectious/ischemic disease requiring right TMA and left 1st toe amputation. -Continue atorvastatin 40 mg daily -Continue clopidogrel 75 mg daily   #Schizoaffective & bipolar disorder #Dementia Follows w/ [facility] mental health as outpatient. Taking Abilify 12 mg daily.  -Continue Abilify 12 mg daily -Outpatient f/u w/ [facility] mental health   #Hx of pulmonary embolism and DVT No extremity pain, increased oxygen requirements, or tachycardia. Low suspicion for DVT/PE at this time.  -Continue IV anticoagulation, transition back to home dosing once stable   #OSA Does not wear CPAP at home.    #GERD -Continue home Protonix 40 mg BID   #HLD -Continue home atorvastatin 40 mg daily   #HTN Holding Lisinopril 2.5 mg for now. Will reassess BP following HD.  -----------------------------  3/8 Back pain overnight, thoracic area, xr ordered No nitro this am

## 2022-07-30 NOTE — Progress Notes (Addendum)
[facility] Progress Note   Subjective:seen on HD. No C/Os.      Objective Vitals:   07/30/22 0841 07/30/22 0900 07/30/22 0930 07/30/22 1000  BP: (!) 141/66 (!) 150/70 (!) 163/96 (!) 142/89  Pulse: 61 68 95 78  Resp: '11 16 13 17  '$ Temp:      TempSrc:      SpO2: 99% 99% 99% (!) 21%  Weight:      Height:       Physical Exam General: Pleasant elderly female in NAD Heart:S1,S2 No M/G/R. SR on monitor. Rate 70s Lungs: CTAB Abdomen: NABS. NT Extremities: R. TMA, multiple toe amps. No LE edema Dialysis Access: L AVF + T/B  Additional Objective Labs: Basic Metabolic Panel: Recent Labs  Lab 07/29/22 0443 07/29/22 1213 07/30/22 0559  NA 136  --  137  K 3.7  --  3.6  CL 96*  --  96*  CO2 25  --  22  GLUCOSE 171*  --  48*  BUN 25*  --  40*  CREATININE 4.06*  --  5.54*  CALCIUM 9.4  --  9.3  PHOS  --  4.9* 6.0*   Liver Function Tests: Recent Labs  Lab 07/29/22 0443 07/30/22 0559  AST 31  --   ALT 19  --   ALKPHOS 210*  --   BILITOT 0.7  --   PROT 7.0  --   ALBUMIN 3.1* 3.0*   Recent Labs  Lab 07/29/22 0443  LIPASE 52*   CBC: Recent Labs  Lab 07/29/22 0443 07/30/22 0559  WBC 6.0 4.3  NEUTROABS 4.0  --   HGB 11.9* 11.6*  HCT 37.5 37.5  MCV 94.9 94.2  PLT 184 171   Blood Culture    Component Value Date/Time   SDES BLOOD SITE NOT SPECIFIED 07/27/2020 0948   SPECREQUEST  07/27/2020 0948    BOTTLES DRAWN AEROBIC AND ANAEROBIC Blood Culture adequate volume   CULT  07/27/2020 0948    NO GROWTH 5 DAYS Performed at [facility], [place] [address], [place], [place] [address]    REPTSTATUS 08/01/2020 FINAL 07/27/2020 0948    Cardiac Enzymes: No results for input(s): "CKTOTAL", "CKMB", "CKMBINDEX", "TROPONINI" in the last 168 hours. CBG: Recent Labs  Lab 07/29/22 1611 07/29/22 2134 07/30/22 0604 07/30/22 0632 07/30/22 0709  GLUCAP 95 157* 49* 70 88   Iron Studies: No results for input(s): "IRON", "TIBC", "TRANSFERRIN",  "FERRITIN" in the last 72 hours. '@lablastinr3'$ @ Studies/Results: ECHOCARDIOGRAM COMPLETE  Result Date: 07/29/2022    ECHOCARDIOGRAM REPORT   Patient Name:   ANNYE DURST Date of Exam: 07/29/2022 Medical Rec #:  [id number]    Height:       64.0 in Accession #:    [id number]   Weight:       138.9 lb Date of Birth:  1951/04/01    BSA:          1.676 m Patient Age:    72 years     BP:           128/67 mmHg Patient Gender: F            HR:           71 bpm. Exam Location:  Inpatient Procedure: 2D Echo, Cardiac Doppler and Color Doppler Indications:    Chest Pain R07.9                 Elevated Troponin  History:  Patient has prior history of Echocardiogram examinations, most                 recent 04/15/2019. CHF, Arrythmias:Tachycardia,                 Signs/Symptoms:Murmur and Chest Pain; Risk Factors:Dyslipidemia,                 Sleep Apnea, Hypertension and Current Smoker.  Sonographer:    Greer Pickerel Referring Phys: [id number] ELIZABETH REES  Sonographer Comments: Image acquisition challenging due to respiratory motion. IMPRESSIONS  1. Left ventricular ejection fraction, by estimation, is 45 to 50%. The left ventricle has mildly decreased function. The left ventricle demonstrates global hypokinesis. There is mild concentric left ventricular hypertrophy. Left ventricular diastolic parameters are consistent with Grade II diastolic dysfunction (pseudonormalization).  2. Right ventricular systolic function is mildly reduced. The right ventricular size is mildly enlarged. There is mildly elevated pulmonary artery systolic pressure. The estimated right ventricular systolic pressure is [id number] mmHg.  3. The mitral valve is grossly normal. Trivial mitral valve regurgitation. No evidence of mitral stenosis.  4. The aortic valve is tricuspid. Aortic valve regurgitation is not visualized. Aortic valve sclerosis is present, with no evidence of aortic valve stenosis.  5. The inferior vena cava is dilated in size with <50%  respiratory variability, suggesting right atrial pressure of 15 mmHg. FINDINGS  Left Ventricle: Left ventricular ejection fraction, by estimation, is 45 to 50%. The left ventricle has mildly decreased function. The left ventricle demonstrates global hypokinesis. The left ventricular internal cavity size was normal in size. There is  mild concentric left ventricular hypertrophy. Left ventricular diastolic parameters are consistent with Grade II diastolic dysfunction (pseudonormalization). Right Ventricle: The right ventricular size is mildly enlarged. No increase in right ventricular wall thickness. Right ventricular systolic function is mildly reduced. There is mildly elevated pulmonary artery systolic pressure. The tricuspid regurgitant  velocity is 2.55 m/s, and with an assumed right atrial pressure of 15 mmHg, the estimated right ventricular systolic pressure is [id number] mmHg. Left Atrium: Left atrial size was normal in size. Right Atrium: Right atrial size was normal in size. Pericardium: There is no evidence of pericardial effusion. Mitral Valve: The mitral valve is grossly normal. Trivial mitral valve regurgitation. No evidence of mitral valve stenosis. Tricuspid Valve: The tricuspid valve is grossly normal. Tricuspid valve regurgitation is trivial. No evidence of tricuspid stenosis. Aortic Valve: The aortic valve is tricuspid. Aortic valve regurgitation is not visualized. Aortic valve sclerosis is present, with no evidence of aortic valve stenosis. Pulmonic Valve: The pulmonic valve was grossly normal. Pulmonic valve regurgitation is trivial. No evidence of pulmonic stenosis. Aorta: The aortic root and ascending aorta are structurally normal, with no evidence of dilitation. Venous: The inferior vena cava is dilated in size with less than 50% respiratory variability, suggesting right atrial pressure of 15 mmHg. IAS/Shunts: The atrial septum is grossly normal.  LEFT VENTRICLE PLAX 2D LVIDd:         3.90 cm       Diastology LVIDs:         3.40 cm      LV e' medial:    3.65 cm/s LV PW:         1.20 cm      LV E/e' medial:  27.7 LV IVS:        1.30 cm      LV e' lateral:   5.27 cm/s LVOT diam:     1.70 cm  LV E/e' lateral: 19.2 LV SV:         39 LV SV Index:   23 LVOT Area:     2.27 cm  LV Volumes (MOD) LV vol d, MOD A2C: 123.0 ml LV vol d, MOD A4C: 95.1 ml LV vol s, MOD A2C: 77.7 ml LV vol s, MOD A4C: 67.8 ml LV SV MOD A2C:     45.3 ml LV SV MOD A4C:     95.1 ml LV SV MOD BP:      34.7 ml RIGHT VENTRICLE RV S prime:     7.05 cm/s TAPSE (M-mode): 1.3 cm LEFT ATRIUM             Index        RIGHT ATRIUM           Index LA diam:        3.00 cm 1.79 cm/m   RA Area:     27.40 cm LA Vol (A2C):   43.1 ml 25.72 ml/m  RA Volume:   91.10 ml  54.37 ml/m LA Vol (A4C):   47.1 ml 28.11 ml/m LA Biplane Vol: 46.2 ml 27.57 ml/m  AORTIC VALVE             PULMONIC VALVE LVOT Vmax:   80.90 cm/s  PR End Diast Vel: 5.34 msec LVOT Vmean:  49.300 cm/s LVOT VTI:    0.171 m  AORTA Ao Root diam: 3.10 cm Ao Asc diam:  3.40 cm MITRAL VALVE                TRICUSPID VALVE MV Area (PHT): 3.65 cm     TR Peak grad:   26.0 mmHg MV Decel Time: 208 msec     TR Vmax:        255.00 cm/s MV E velocity: 101.00 cm/s MV A velocity: 87.90 cm/s   SHUNTS MV E/A ratio:  1.15         Systemic VTI:  0.17 m                             Systemic Diam: 1.70 cm Eleonore Chiquito MD Electronically signed by Eleonore Chiquito MD Signature Date/Time: 07/29/2022/1:28:42 PM    Final    CT Cervical Spine Wo Contrast  Result Date: 07/29/2022 CLINICAL DATA:  72 year old female status post fall 2 weeks ago with persistent dizziness, nausea. Vomiting and decreased appetite. Dialysis patient. EXAM: CT CERVICAL SPINE WITHOUT CONTRAST TECHNIQUE: Multidetector CT imaging of the cervical spine was performed without intravenous contrast. Multiplanar CT image reconstructions were also generated. RADIATION DOSE REDUCTION: This exam was performed according to the departmental  dose-optimization program which includes automated exposure control, adjustment of the mA and/or kV according to patient size and/or use of iterative reconstruction technique. COMPARISON:  Head CT today reported separately. Cervical spine CT 10/27/2020. FINDINGS: Alignment: Chronic anterolisthesis of C4 on C5. Chronic straightening of cervical lordosis. Less reversal compared to 2022. Cervicothoracic junction alignment is within normal limits. Bilateral posterior element alignment is within normal limits. Skull base and vertebrae: Stable bone mineralization. Visualized skull base is intact. No atlanto-occipital dissociation. C1 and C2 appear intact and aligned. Chronic endplate erosions [id number], C5-C6. Chronic C6 inferior endplate compression. Unilateral chronic C4-C5 facet erosion. Suspect chronic dialysis related spondyloarthropathy. No acute osseous abnormality identified. Soft tissues and spinal canal: No prevertebral fluid or swelling. No visible canal hematoma. Disc levels: Chronic degeneration C4-C5 through C6-C7. Mild multifactorial spinal stenosis  suspected at C4-C5. Upper chest: Osteopenia. No acute osseous abnormality identified. Negative lung apices. Small volume retained gas and secretions in the upper thoracic esophagus. IMPRESSION: 1. No acute traumatic injury identified in the cervical spine. 2. 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 Mild multifactorial spinal stenosis suspected at C4-C5. Electronically Signed   By: Genevie Ann M.D.   On: 07/29/2022 06:03   CT Head Wo Contrast  Result Date: 07/29/2022 CLINICAL DATA:  72 year old female status post fall 2 weeks ago with persistent dizziness, nausea. Vomiting and decreased appetite. Dialysis patient. EXAM: CT HEAD WITHOUT CONTRAST TECHNIQUE: Contiguous axial images were obtained from the base of the skull through the vertex without intravenous contrast. RADIATION DOSE REDUCTION: This exam was performed according to the  departmental dose-optimization program which includes automated exposure control, adjustment of the mA and/or kV according to patient size and/or use of iterative reconstruction technique. COMPARISON:  Head CT 10/27/2020.  Brain MRI 07/27/2020. FINDINGS: Brain: Stable cerebral volume. No midline shift, ventriculomegaly, mass effect, evidence of mass lesion, intracranial hemorrhage or evidence of cortically based acute infarction. Stable and normal for age gray-white differentiation. No convincing encephalomalacia. Vascular: Extensive Calcified atherosclerosis at the skull base. No suspicious intracranial vascular hyperdensity. Skull: No acute osseous abnormality identified. Sinuses/Orbits: Visualized paranasal sinuses and mastoids are stable and well aerated. Other: Calcified scalp vessel atherosclerosis. No orbit or scalp soft tissue injury identified. IMPRESSION: 1. No recent traumatic injury identified. Stable and normal for age noncontrast CT appearance of the brain. 2. Advanced calcified atherosclerosis compatible with End stage renal disease. Electronically Signed   By: Genevie Ann M.D.   On: 07/29/2022 05:58   DG Chest Port 1 View  Result Date: 07/29/2022 CLINICAL DATA:  72 year old female with history of shortness of breath. EXAM: PORTABLE CHEST 1 VIEW COMPARISON:  Chest x-ray 04/27/2022. FINDINGS: Lung volumes are normal. No consolidative airspace disease. No pleural effusions. No pneumothorax. No evidence of pulmonary edema. Heart size is mildly enlarged (unchanged). Upper mediastinal contours are within normal limits. Atherosclerotic calcifications are noted in the thoracic aorta. Vascular stent projecting over the left axillary region. IMPRESSION: 1. No radiographic evidence of acute cardiopulmonary disease. 2. Mild cardiomegaly. 3. Aortic atherosclerosis. Electronically Signed   By: Vinnie Langton M.D.   On: 07/29/2022 05:16   Medications:  heparin 900 Units/hr (07/29/22 2303)    amLODipine  2.5 mg  Oral Daily   ARIPiprazole  12 mg Oral QHS   atorvastatin  40 mg Oral QHS   Chlorhexidine Gluconate Cloth  6 each Topical Q0600   clopidogrel  75 mg Oral Daily   ferric citrate  420 mg Oral BID WC   gabapentin  100 mg Oral BID   insulin aspart  0-6 Units Subcutaneous TID WC   insulin detemir  15 Units Subcutaneous QHS   isosorbide mononitrate  15 mg Oral Q breakfast   lidocaine  1 patch Transdermal Q24H   metoprolol succinate  12.5 mg Oral Daily   ondansetron (ZOFRAN) IV  4 mg Intravenous Once   pantoprazole  40 mg Oral BID AC   umeclidinium bromide  2 puff Inhalation q morning     [facility] HD: [facility]  3.5h  400/1.5  59kg  2/2 bath  AVF   - No Heparin   - last HD 3/4, post wt 59.2kg - venofer '100mg'$  tiw thru 3/18 4/10 dose have been given.  - hectorol 1 mcg IV tiw - korsuva 0.6 IV tiw - mircera 50 mcg IV q  2 wks, last 2/26, due 3/11     Assessment/ Plan: NSTEMI - IV heparin, seen by cardiology, per cards/ pmd ESRD - on HD MWF. Has not missed HD. HD today on schedule. Next HD 08/01/2022 HTN/ volume - BP's are reasonable, up 3.5kg by wts. UF same as tolerated, optimize volume with dialysis.  Anemia esrd - Hb 11s, no esa needs. Continue Fe load.  MBD ckd - CCa and phos are in range. Cont IV vdra.  Atrial fib - per pmd PAD - hx foot/ toe amps H/o DVT/ pulm embolism Hx dementia/ bipolar/ schizoaffective d/o    Rex Magee H. Lorana Maffeo NP-C 07/30/2022, 10:26 AM  [employer] [phone]

## 2022-07-30 NOTE — Progress Notes (Signed)
ANTICOAGULATION CONSULT NOTE  Pharmacy Consult for Heparin Indication: chest pain/ACS  No Known Allergies  Patient Measurements: Height: '5\' 4"'$  (162.6 cm) Weight: 84.1 kg (185 lb 6.5 oz) IBW/kg (Calculated) : 54.7 Heparin Dosing Weight: 55 kg  Vital Signs: Temp: 97.5 F (36.4 C) (03/06 2008) Temp Source: Axillary (03/06 2008) BP: 144/90 (03/06 1347) Pulse Rate: 65 (03/06 2008)  Labs: Recent Labs    07/29/22 0443 07/29/22 0658 07/30/22 0559 07/30/22 0915 07/30/22 1845  HGB 11.9*  --  11.6*  --   --   HCT 37.5  --  37.5  --   --   PLT 184  --  171  --   --   APTT  --   --  >200* 87* 105*  HEPARINUNFRC  --   --  >1.10*  --   --   CREATININE 4.06*  --  5.54*  --  3.14*  TROPONINIHS 200* 167*  --   --   --      Estimated Creatinine Clearance: 17.5 mL/min (A) (by C-G formula based on SCr of 3.14 mg/dL (H)).   Assessment: 88 you female with CP and history of CAD w/ PCI and ESRD on HD. Pharmacy consulted to dose heparin. She is noted with history of afib and is on apixaban PTA (last dose taken 3/5 ~ 11am). AM aPTT elevated >200, confirmed with patient- lab was drawn at site of heparin infusion, most likely co-infused when lab was drawn causing falsely elevated aPTT. Confirmed with RN, new level drawn 1 hour into HD.   Stat aPTT 87 on drip rate 900 units/hr therapeutic. First heparin level within range. Hgb 11.6 and plt 171. No s/sx bleeding noted per RN.   PTT came back just above goal. We will decrease rate slightly and check another level in Am.  Goal of Therapy:  Heparin level 0.3-0.7 units/ml aPTT 66-102 seconds Monitor platelets by anticoagulation protocol: Yes   Plan:  Decrease heparin infusion 850 units/hr Check aPTT/HL in AM and  Monitor heparin level and aPTTs daily daily Continue to monitor H&H and platelets  Onnie Boer, PharmD, BCIDP, AAHIVP, CPP Infectious Disease Pharmacist 07/30/2022 8:46 PM

## 2022-07-30 NOTE — Progress Notes (Signed)
[facility] for Heparin Indication: chest pain/ACS  No Known Allergies  Patient Measurements: Height: '5\' 4"'$  (162.6 cm) Weight: 87.1 kg (192 lb 0.3 oz) IBW/kg (Calculated) : 54.7 Heparin Dosing Weight: 55 kg  Vital Signs: Temp: 97.5 F (36.4 C) (03/06 0827) Temp Source: Oral (03/06 0827) BP: 123/57 (03/06 1200) Pulse Rate: 57 (03/06 1200)  Labs: Recent Labs    07/29/22 0443 07/29/22 0658 07/30/22 0559 07/30/22 0915  HGB 11.9*  --  11.6*  --   HCT 37.5  --  37.5  --   PLT 184  --  171  --   APTT  --   --  >200* 87*  HEPARINUNFRC  --   --  >1.10*  --   CREATININE 4.06*  --  5.54*  --   TROPONINIHS 200* 167*  --   --     Estimated Creatinine Clearance: 10.1 mL/min (A) (by C-G formula based on SCr of 5.54 mg/dL (H)).   Assessment: 74 you female with CP and history of CAD w/ PCI and ESRD on HD. Pharmacy consulted to dose heparin. She is noted with history of afib and is on apixaban PTA (last dose taken 3/5 ~ 11am). AM aPTT elevated >200, confirmed with patient- lab was drawn at site of heparin infusion, most likely co-infused when lab was drawn causing falsely elevated aPTT. Confirmed with RN, new level drawn 1 hour into HD.   Stat aPTT 87 on drip rate 900 units/hr therapeutic. First heparin level within range. Hgb 11.6 and plt 171. No s/sx bleeding noted per RN.   Goal of Therapy:  Heparin level 0.3-0.7 units/ml aPTT 66-102 seconds Monitor platelets by anticoagulation protocol: Yes   Plan:  Continue heparin infusion at 900 units/hr Check aPTT in 8 hours and  Monitor heparin level and aPTTs daily daily Continue to monitor H&H and platelets   Thank you for allowing pharmacy to be a part of this patient's care.  Gena Fray, PharmD PGY1 Pharmacy Resident   07/30/2022 12:17 PM

## 2022-07-30 NOTE — Progress Notes (Addendum)
HD#0 SUBJECTIVE:  Patient Summary: Robin [name] is a 72 y.o. 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   Overnight Events: Hypoglycemic overnight. Patient's sugar was 49 but improved to 70 this morning with juice. Patient eating breakfast in bed and reports good appetite. She denies any chest pain today but felt chest pressure during the night. She also reports lower back pain at midline and states that she has had this pain for about a week. She reports that when she fell last week but states that her back pain did not start after the fall. Tylenol seems to alleviate the pain. She also feels pulsating/sharp pains in both of her feet.    OBJECTIVE:  Vital Signs: Vitals:   07/30/22 0900 07/30/22 0930 07/30/22 1000 07/30/22 1030  BP: (!) 150/70 (!) 163/96 (!) 142/89 123/66  Pulse: 68 95 78 68  Resp: '16 13 17 11  '$ Temp:      TempSrc:      SpO2: 99% 99% (!) 21% 98%  Weight:      Height:       Supplemental O2: Room Air SpO2: 98 %  Filed Weights   07/29/22 0422 07/30/22 0827  Weight: 63 kg 87.1 kg     Intake/Output Summary (Last 24 hours) at 07/30/2022 1101 Last data filed at 07/30/2022 1000 Gross per 24 hour  Intake 698.08 ml  Output 1200 ml  Net -501.92 ml   Net IO Since Admission: -501.92 mL [07/30/22 1101]  Physical Exam: Physical Exam Constitutional:      General: She is not in acute distress.    Appearance: Normal appearance. She is obese.  HENT:     Head: Normocephalic and atraumatic.     Mouth/Throat:     Mouth: Mucous membranes are moist.  Cardiovascular:     Rate and Rhythm: Normal rate and regular rhythm.     Pulses:          Radial pulses are 2+ on the right side and 2+ on the left side.  Pulmonary:     Effort: Pulmonary effort  is normal. No respiratory distress.     Breath sounds: Normal breath sounds.  Abdominal:     General: Bowel sounds are normal. There is no distension.     Palpations: Abdomen is soft.     Tenderness: There is no abdominal tenderness.  Musculoskeletal:     Comments: Tenderness to palpation of the midline lumbar region, no deformity noted, no paraspinal muscle tenderness   Skin:    General: Skin is warm and dry.  Neurological:     General: No focal deficit present.     Mental Status: She is alert and oriented to person, place, and time.  Psychiatric:        Mood and Affect: Mood normal.        Behavior: Behavior normal.     Patient Lines/Drains/Airways Status     Active Line/Drains/Airways     Name Placement date Placement time Site Days   Peripheral IV 07/29/22 22 G Posterior;Right Hand 07/29/22  0446  Hand  1   Fistula / Graft Left Upper arm Arteriovenous fistula 09/30/14  --  Upper arm  2860   Fistula / Graft Left Upper arm Arteriovenous fistula --  --  Upper arm  --  Fistula / Graft Left Upper arm --  --  Upper arm  --   External Urinary Catheter 07/29/22  1930  --  1   Incision (Closed) 03/16/15 Leg Left 03/16/15  1346  -- 2693   Incision (Closed) 08/06/16 Foot Right 08/06/16  0915  -- 2184   Incision (Closed) 09/09/16 Foot Right 09/09/16  0924  -- 2150            Pertinent Labs:    Latest Ref Rng & Units 07/30/2022    5:59 AM 07/29/2022    4:43 AM 04/27/2022    5:35 PM  CBC  WBC 4.0 - 10.5 K/uL 4.3  6.0  8.6   Hemoglobin 12.0 - 15.0 g/dL 11.6  11.9  12.1   Hematocrit 36.0 - 46.0 % 37.5  37.5  37.9   Platelets 150 - 400 K/uL 171  184  171        Latest Ref Rng & Units 07/30/2022    5:59 AM 07/29/2022    4:43 AM 04/27/2022    5:35 PM  CMP  Glucose 70 - 99 mg/dL 48  171  109   BUN 8 - 23 mg/dL 40  25  32   Creatinine 0.44 - 1.00 mg/dL 5.54  4.06  7.58   Sodium 135 - 145 mmol/L 137  136  136   Potassium 3.5 - 5.1 mmol/L 3.6  3.7  4.2   Chloride 98 - 111 mmol/L 96   96  94   CO2 22 - 32 mmol/L '22  25  21   '$ Calcium 8.9 - 10.3 mg/dL 9.3  9.4  10.6   Total Protein 6.5 - 8.1 g/dL  7.0  7.5   Total Bilirubin 0.3 - 1.2 mg/dL  0.7  1.4   Alkaline Phos 38 - 126 U/L  210  97   AST 15 - 41 U/L  31  34   ALT 0 - 44 U/L  19  18     Recent Labs    07/30/22 0604 07/30/22 0632 07/30/22 0709  GLUCAP 49* 70 88     Pertinent Imaging: ECHOCARDIOGRAM COMPLETE  Result Date: 07/29/2022    ECHOCARDIOGRAM REPORT   Patient Name:   Robin [name] Date of Exam: 07/29/2022 Medical Rec #:  [id number]    Height:       64.0 in Accession #:    [id number]   Weight:       138.9 lb Date of Birth:  07-24-1950    BSA:          1.676 m Patient Age:    56 years     BP:           128/67 mmHg Patient Gender: F            HR:           71 bpm. Exam Location:  Inpatient Procedure: 2D Echo, Cardiac Doppler and Color Doppler Indications:    Chest Pain R07.9                 Elevated Troponin  History:        Patient has prior history of Echocardiogram examinations, most                 recent 04/15/2019. CHF, Arrythmias:Tachycardia,                 Signs/Symptoms:Murmur and Chest Pain; Risk Factors:Dyslipidemia,  Sleep Apnea, Hypertension and Current Smoker.  Sonographer:    Greer Pickerel Referring Phys: [id number] ELIZABETH REES  Sonographer Comments: Image acquisition challenging due to respiratory motion. IMPRESSIONS  1. Left ventricular ejection fraction, by estimation, is 45 to 50%. The left ventricle has mildly decreased function. The left ventricle demonstrates global hypokinesis. There is mild concentric left ventricular hypertrophy. Left ventricular diastolic parameters are consistent with Grade II diastolic dysfunction (pseudonormalization).  2. Right ventricular systolic function is mildly reduced. The right ventricular size is mildly enlarged. There is mildly elevated pulmonary artery systolic pressure. The estimated right ventricular systolic pressure is [id number].  3. The mitral valve  is grossly normal. Trivial mitral valve regurgitation. No evidence of mitral stenosis.  4. The aortic valve is tricuspid. Aortic valve regurgitation is not visualized. Aortic valve sclerosis is present, with no evidence of aortic valve stenosis.  5. The inferior vena cava is dilated in size with <50% respiratory variability, suggesting right atrial pressure of 15 mmHg. FINDINGS  Left Ventricle: Left ventricular ejection fraction, by estimation, is 45 to 50%. The left ventricle has mildly decreased function. The left ventricle demonstrates global hypokinesis. The left ventricular internal cavity size was normal in size. There is  mild concentric left ventricular hypertrophy. Left ventricular diastolic parameters are consistent with Grade II diastolic dysfunction (pseudonormalization). Right Ventricle: The right ventricular size is mildly enlarged. No increase in right ventricular wall thickness. Right ventricular systolic function is mildly reduced. There is mildly elevated pulmonary artery systolic pressure. The tricuspid regurgitant  velocity is 2.55 m/s, and with an assumed right atrial pressure of 15 mmHg, the estimated right ventricular systolic pressure is [id number] mmHg. Left Atrium: Left atrial size was normal in size. Right Atrium: Right atrial size was normal in size. Pericardium: There is no evidence of pericardial effusion. Mitral Valve: The mitral valve is grossly normal. Trivial mitral valve regurgitation. No evidence of mitral valve stenosis. Tricuspid Valve: The tricuspid valve is grossly normal. Tricuspid valve regurgitation is trivial. No evidence of tricuspid stenosis. Aortic Valve: The aortic valve is tricuspid. Aortic valve regurgitation is not visualized. Aortic valve sclerosis is present, with no evidence of aortic valve stenosis. Pulmonic Valve: The pulmonic valve was grossly normal. Pulmonic valve regurgitation is trivial. No evidence of pulmonic stenosis. Aorta: The aortic root and ascending  aorta are structurally normal, with no evidence of dilitation. Venous: The inferior vena cava is dilated in size with less than 50% respiratory variability, suggesting right atrial pressure of 15 mmHg. IAS/Shunts: The atrial septum is grossly normal.  LEFT VENTRICLE PLAX 2D LVIDd:         3.90 cm      Diastology LVIDs:         3.40 cm      LV e' medial:    3.65 cm/s LV PW:         1.20 cm      LV E/e' medial:  27.7 LV IVS:        1.30 cm      LV e' lateral:   5.27 cm/s LVOT diam:     1.70 cm      LV E/e' lateral: 19.2 LV SV:         39 LV SV Index:   23 LVOT Area:     2.27 cm  LV Volumes (MOD) LV vol d, MOD A2C: 123.0 ml LV vol d, MOD A4C: 95.1 ml LV vol s, MOD A2C: 77.7 ml LV vol s, MOD A4C: 67.8 ml  LV SV MOD A2C:     45.3 ml LV SV MOD A4C:     95.1 ml LV SV MOD BP:      34.7 ml RIGHT VENTRICLE RV S prime:     7.05 cm/s TAPSE (M-mode): 1.3 cm LEFT ATRIUM             Index        RIGHT ATRIUM           Index LA diam:        3.00 cm 1.79 cm/m   RA Area:     27.40 cm LA Vol (A2C):   43.1 ml 25.72 ml/m  RA Volume:   91.10 ml  54.37 ml/m LA Vol (A4C):   47.1 ml 28.11 ml/m LA Biplane Vol: 46.2 ml 27.57 ml/m  AORTIC VALVE             PULMONIC VALVE LVOT Vmax:   80.90 cm/s  PR End Diast Vel: 5.34 msec LVOT Vmean:  49.300 cm/s LVOT VTI:    0.171 m  AORTA Ao Root diam: 3.10 cm Ao Asc diam:  3.40 cm MITRAL VALVE                TRICUSPID VALVE MV Area (PHT): 3.65 cm     TR Peak grad:   26.0 mmHg MV Decel Time: 208 msec     TR Vmax:        255.00 cm/s MV E velocity: 101.00 cm/s MV A velocity: 87.90 cm/s   SHUNTS MV E/A ratio:  1.15         Systemic VTI:  0.17 m                             Systemic Diam: 1.70 cm Eleonore Chiquito MD Electronically signed by Eleonore Chiquito MD Signature Date/Time: 07/29/2022/1:28:42 PM    Final     ASSESSMENT/PLAN:  Assessment: Principal Problem:   Chest pain Active Problems:   NSTEMI (non-ST elevated myocardial infarction) (HCC)   PAC (premature atrial contraction)   Hyperlipidemia with  target LDL less than 70   Robin [name] is a 72 y.o. 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 Currently on hospital day 1.  Plan: #NSTEMI Hx of CAD s/p PCI Has significant cardiac hx w/ prior stent placement. Most recent cath in 2019 w/  non-obstructive disease in the LAD/RCA, circumflex heavily calcified with stenosis. It was recommended to continue medical therapy due to difficult PCI w/ calcification/ prior stent in this area. Presented w/ constant central chest pressure radiating to the L neck. EKG w/ more prominent TWI in the inferior leads and QRS changes in V2/V3. Initial troponin elevated at 200, flat at 167. Echo here demonstrates EF 45-50% with global hypokinesis of the LV w/ G2DD. Heparin drip started yesterday. Patient denies chest pain today. Medical therapy recommended by cardiology at this time.  -Appreciate cardiology recommendations  -Continue atorvastatin 40 mg daily -Continue heparin GGT -Nitro PRN -Telemetry  #Hx of combined systolic and diastolic HF Echo here demonstrates EF 45-50% with global hypokinesis of the LV w/ G2DD. GDMT limited by ESRD. No signs of volume overload on exam.    #Hx of atrial fibrillation On eliquis at home. Remains in sinus rhythm.  -Continue  heparin ggt for NSTEMI   #ESRD on HD (MWF) No missed HD sessions as outpatient. Plan for HD today.  -Appreciate -Continue ferric citrate 520 mg BID  #Back pain Has tenderness to palpation of the midline lumbar spine without deformities noted. Suspect likely musculoskeletal in nature. Will add lidocaine patch to regimen and reassess.  -Start Lidocaine patch   #Feet pain Describes this pain as similar to her history of neuropathy in the setting of T2DM. Will restart her home  gabapentin. -Start gabapentin '100mg'$  BID   #Type 2 DM Most recent A1c in 04/2022 of 7.0%. Hypoglycemic in the 40s overnight. Improved to 80s w/ juice. Patient eating well. Will decrease levemir to 10 U daily.  -Continue SSI -Decrease levemir to 10 U QHS -Trend CBGs   #PAD Has hx of bilateral LE arterial disease. S/p LLE SFA stent in 2023. Hospitalized last year for infectious/ischemic disease requiring right TMA and left 1st toe amputation. -Continue atorvastatin 40 mg daily -Continue clopidogrel 75 mg daily   #Schizoaffective & bipolar disorder #Dementia Follows w/ [facility] mental health as outpatient. Taking Abilify 12 mg daily.  -Continue Abilify 12 mg daily -Outpatient f/u w/ [facility]   #Hx of pulmonary embolism and DVT No extremity pain, increased oxygen requirements, or tachycardia. Low suspicion for DVT/PE at this time.  -Continue IV anticoagulation, transition back to home dosing once stable   #OSA Does not wear CPAP at home.    #GERD -Continue home Protonix 40 mg BID   #HLD -Continue home atorvastatin 40 mg daily   #HTN Holding Lisinopril 2.5 mg for now. Will reassess BP following HD.   Diet: Renal VTE: heparin ggt IVF: None Code: Full PT/OT recs: pending   Prior to Admission Living Arrangement: Home w/ family Anticipated Discharge Location: TBD Barriers to Discharge: continued management  Dispo: Anticipated discharge in approximately less than 2 day(s).    Signature: Marisa Cyphers, Medical Student   Please contact the on call pager after 5 pm and on weekends at [phone].   Attestation for Student Documentation:  I personally was present and performed or re-performed the history, physical exam and medical decision-making activities of this service and have verified that the service and findings are accurately documented in the student's note.  Starlyn Skeans, MD 07/30/2022, 1:50 PM

## 2022-07-30 NOTE — Progress Notes (Signed)
Inpatient Diabetes Program Recommendations  AACE/ADA: New Consensus Statement on Inpatient Glycemic Control (2015)  Target Ranges:  Prepandial:   less than 140 mg/dL      Peak postprandial:   less than 180 mg/dL (1-2 hours)      Critically ill patients:  140 - 180 mg/dL   Lab Results  Component Value Date   GLUCAP 88 07/30/2022   HGBA1C 7.7 (H) 07/29/2022    Review of Glycemic Control  Latest Reference Range & Units 07/30/22 06:04 07/30/22 06:32 07/30/22 07:09  Glucose-Capillary 70 - 99 mg/dL 49 (L) 70 88   Diabetes history: DM 2 Outpatient Diabetes medications:  Novolog 6 units tid with meals Levemir 15 units q HS Current orders for Inpatient glycemic control:  Novolog 0-6 units tid with meals Levemir 15 units q HS  Inpatient Diabetes Program Recommendations:    Please consider reducing Levemir to 10 units q HS.   Thanks,  Adah Perl, RN, BC-ADM Inpatient Diabetes Coordinator Pager [phone]  (8a-5p)

## 2022-07-30 NOTE — Progress Notes (Signed)
Robin Dallas, MD regarding due Levimir 15 units last night. Informed regarding the trends of blood sugar and patient has no appetite. Patient refused bed time snack. Given per advised and he agreed to check the blood sugar every 4 hrs thereafter.   2400H - cbg = 101.  0604H - cbg = 49 mg/dl. Alert and oriented.

## 2022-07-30 NOTE — Progress Notes (Signed)
Pt receives out-pt HD at [facility] on MWF. Will assist as needed.   Melven Sartorius Renal Navigator [phone]

## 2022-07-30 NOTE — Progress Notes (Signed)
Hypoglycemic Event  CBG: 49  Treatment: 4 oz juice/soda  Symptoms: Hungry  Follow-up CBG: [id number]  CBG Result:70   Possible Reasons for Event: Inadequate meal intake  Comments/MD notified: Roosevelt Locks, MD     Kaylyn Lim, BSN, PCCN

## 2022-07-30 NOTE — Progress Notes (Signed)
Received patient in bed to unit.  Alert and oriented.  Informed consent signed and in chart.   [place] duration:3.5  Patient tolerated well.  Transported back to the room  Alert, without acute distress.  Hand-off given to patient's nurse.   Access used: LLAF Access issues: no complications  Total UF removed: 3000 Medication(s) given: ferrilicit  [facility]

## 2022-07-30 NOTE — Plan of Care (Signed)
  Problem: Education: Goal: Knowledge of General Education information will improve Description: Including pain rating scale, medication(s)/side effects and non-pharmacologic comfort measures Outcome: Progressing   Problem: Health Behavior/Discharge Planning: Goal: Ability to manage health-related needs will improve Outcome: Progressing   Problem: Clinical Measurements: Goal: Respiratory complications will improve Outcome: Progressing   Problem: Clinical Measurements: Goal: Cardiovascular complication will be avoided Outcome: Progressing   Problem: Coping: Goal: Level of anxiety will decrease Outcome: Progressing   Problem: Nutrition: Goal: Adequate nutrition will be maintained Outcome: Progressing   Problem: Pain Managment: Goal: General experience of comfort will improve Outcome: Progressing   Problem: Safety: Goal: Ability to remain free from injury will improve Outcome: Progressing   Problem: Skin Integrity: Goal: Risk for impaired skin integrity will decrease Outcome: Progressing

## 2022-07-30 NOTE — Care Management (Signed)
  Transition of Care [name]) Screening Note   Patient Details  Name: Robin [name] Date of Birth: 02-08-1951   Transition of Care [name]) CM/SW Contact:    Bethena Roys, RN Phone Number: 07/30/2022, 10:07 AM    Transition of Care Department [facility]) has reviewed the patient and no TOC needs have been identified at this time. Case Manager spoke with daughter Robin [name], patient has a [facility] Benefit and the daughter notified the [place] of hospitalization. The [facility] arranged personal care services via [place] visits. Family will have to arrange the services post hospitalization. Daughter states the patient has DME WC/RW and she will have transportation home. TOC will continue to monitor patient advancement through interdisciplinary progression rounds. If new patient transition needs arise, please place a TOC consult.

## 2022-07-30 NOTE — Progress Notes (Signed)
Rounding Note    [name] Date of Encounter: 07/30/2022  [facility] Cardiologist: Dr. Daneen Schick  Subjective   Back from dialysis; she continues to experience some mild chest discomfort.  Is now on IV heparin.  Inpatient Medications    Scheduled Meds:  amLODipine  2.5 mg Oral Daily   ARIPiprazole  12 mg Oral QHS   atorvastatin  40 mg Oral QHS   Chlorhexidine Gluconate Cloth  6 each Topical Q0600   clopidogrel  75 mg Oral Daily   ferric citrate  420 mg Oral BID WC   gabapentin  100 mg Oral BID   insulin aspart  0-6 Units Subcutaneous TID WC   insulin detemir  10 Units Subcutaneous QHS   isosorbide mononitrate  15 mg Oral Q breakfast   lidocaine  1 patch Transdermal Q24H   metoprolol succinate  12.5 mg Oral Daily   pantoprazole  40 mg Oral BID AC   umeclidinium bromide  2 puff Inhalation q morning   Continuous Infusions:  ferric gluconate (FERRLECIT) IVPB Stopped (07/30/22 1405)   heparin 900 Units/hr (07/30/22 1000)   PRN Meds: acetaminophen, albuterol, nitroGLYCERIN   Vital Signs    Vitals:   07/30/22 1200 07/30/22 1205 07/30/22 1225 07/30/22 1347  BP: (!) 123/57 (!) 167/52 (!) 165/78 (!) 144/90  Pulse: (!) 57 74 75 77  Resp: '16 10 16 20  '$ Temp:   97.6 F (36.4 C) 98.1 F (36.7 C)  TempSrc:    Oral  SpO2: 99% 100% 99% 100%  Weight:   84.1 kg   Height:        Intake/Output Summary (Last 24 hours) at 07/30/2022 1752 Last data filed at 07/30/2022 1225 Gross per 24 hour  Intake 698.08 ml  Output 3000 ml  Net -2301.92 ml      07/30/2022   12:25 PM 07/30/2022    8:27 AM 07/29/2022    4:22 AM  Last 3 Weights  Weight (lbs) 185 lb 6.5 oz 192 lb 0.3 oz 138 lb 14.2 oz  Weight (kg) 84.1 kg 87.1 kg 63 kg      Telemetry    Sinus with isolated PVC, heart rate 72 bpm- Personally Reviewed  ECG    Sinus rhythm at 66, PACs, lateral ST abnormality- Personally Reviewed  Physical Exam   GEN: No acute distress.   Neck: No JVD Cardiac:  Regular rhythm with occasional ectopic complex. Respiratory: Decreased breath sounds at bases. GI: Soft, nontender, non-distended  MS: Left first and second toe amputation, right foot metatarsal amputations Neuro:  Nonfocal  Psych: Normal affect   Labs    High Sensitivity Troponin:   Recent Labs  Lab 07/29/22 0443 07/29/22 0658  TROPONINIHS 200* 167*     Chemistry Recent Labs  Lab 07/29/22 0443 07/29/22 1213 07/30/22 0559  NA 136  --  137  K 3.7  --  3.6  CL 96*  --  96*  CO2 25  --  22  GLUCOSE 171*  --  48*  BUN 25*  --  40*  CREATININE 4.06*  --  5.54*  CALCIUM 9.4  --  9.3  MG  --  2.3  --   PROT 7.0  --   --   ALBUMIN 3.1*  --  3.0*  AST 31  --   --   ALT 19  --   --   ALKPHOS 210*  --   --   BILITOT 0.7  --   --  GFRNONAA 11*  --  8*  ANIONGAP 15  --  19*    Lipids No results for input(s): "CHOL", "TRIG", "HDL", "LABVLDL", "LDLCALC", "CHOLHDL" in the last 168 hours.  Hematology Recent Labs  Lab 07/29/22 0443 07/30/22 0559  WBC 6.0 4.3  RBC 3.95 3.98  HGB 11.9* 11.6*  HCT 37.5 37.5  MCV 94.9 94.2  MCH 30.1 29.1  MCHC 31.7 30.9  RDW 19.8* 19.3*  PLT 184 171   Thyroid No results for input(s): "TSH", "FREET4" in the last 168 hours.  BNP Recent Labs  Lab 07/29/22 0443  BNP 1,147.2*    DDimer No results for input(s): "DDIMER" in the last 168 hours.   Radiology    ECHOCARDIOGRAM COMPLETE  Result Date: 07/29/2022    ECHOCARDIOGRAM REPORT   Patient Name:   LEDIA FINUCANE Date of Exam: 07/29/2022 Medical Rec #:  [id number]    Height:       64.0 in Accession #:    [id number]   Weight:       138.9 lb Date of Birth:  August 06, 1950    BSA:          1.676 m Patient Age:    72 years     BP:           128/67 mmHg Patient Gender: F            HR:           71 bpm. Exam Location:  Inpatient Procedure: 2D Echo, Cardiac Doppler and Color Doppler Indications:    Chest Pain R07.9                 Elevated Troponin  History:        Patient has prior history of Echocardiogram  examinations, most                 recent 04/15/2019. CHF, Arrythmias:Tachycardia,                 Signs/Symptoms:Murmur and Chest Pain; Risk Factors:Dyslipidemia,                 Sleep Apnea, Hypertension and Current Smoker.  Sonographer:    Greer Pickerel Referring Phys: [id number] ELIZABETH REES  Sonographer Comments: Image acquisition challenging due to respiratory motion. IMPRESSIONS  1. Left ventricular ejection fraction, by estimation, is 45 to 50%. The left ventricle has mildly decreased function. The left ventricle demonstrates global hypokinesis. There is mild concentric left ventricular hypertrophy. Left ventricular diastolic parameters are consistent with Grade II diastolic dysfunction (pseudonormalization).  2. Right ventricular systolic function is mildly reduced. The right ventricular size is mildly enlarged. There is mildly elevated pulmonary artery systolic pressure. The estimated right ventricular systolic pressure is [id number] mmHg.  3. The mitral valve is grossly normal. Trivial mitral valve regurgitation. No evidence of mitral stenosis.  4. The aortic valve is tricuspid. Aortic valve regurgitation is not visualized. Aortic valve sclerosis is present, with no evidence of aortic valve stenosis.  5. The inferior vena cava is dilated in size with <50% respiratory variability, suggesting right atrial pressure of 15 mmHg. FINDINGS  Left Ventricle: Left ventricular ejection fraction, by estimation, is 45 to 50%. The left ventricle has mildly decreased function. The left ventricle demonstrates global hypokinesis. The left ventricular internal cavity size was normal in size. There is  mild concentric left ventricular hypertrophy. Left ventricular diastolic parameters are consistent with Grade II diastolic dysfunction (pseudonormalization). Right Ventricle: The right ventricular size is mildly enlarged.  No increase in right ventricular wall thickness. Right ventricular systolic function is mildly reduced. There is  mildly elevated pulmonary artery systolic pressure. The tricuspid regurgitant  velocity is 2.55 m/s, and with an assumed right atrial pressure of 15 mmHg, the estimated right ventricular systolic pressure is [id number] mmHg. Left Atrium: Left atrial size was normal in size. Right Atrium: Right atrial size was normal in size. Pericardium: There is no evidence of pericardial effusion. Mitral Valve: The mitral valve is grossly normal. Trivial mitral valve regurgitation. No evidence of mitral valve stenosis. Tricuspid Valve: The tricuspid valve is grossly normal. Tricuspid valve regurgitation is trivial. No evidence of tricuspid stenosis. Aortic Valve: The aortic valve is tricuspid. Aortic valve regurgitation is not visualized. Aortic valve sclerosis is present, with no evidence of aortic valve stenosis. Pulmonic Valve: The pulmonic valve was grossly normal. Pulmonic valve regurgitation is trivial. No evidence of pulmonic stenosis. Aorta: The aortic root and ascending aorta are structurally normal, with no evidence of dilitation. Venous: The inferior vena cava is dilated in size with less than 50% respiratory variability, suggesting right atrial pressure of 15 mmHg. IAS/Shunts: The atrial septum is grossly normal.  LEFT VENTRICLE PLAX 2D LVIDd:         3.90 cm      Diastology LVIDs:         3.40 cm      LV e' medial:    3.65 cm/s LV PW:         1.20 cm      LV E/e' medial:  27.7 LV IVS:        1.30 cm      LV e' lateral:   5.27 cm/s LVOT diam:     1.70 cm      LV E/e' lateral: 19.2 LV SV:         39 LV SV Index:   23 LVOT Area:     2.27 cm  LV Volumes (MOD) LV vol d, MOD A2C: 123.0 ml LV vol d, MOD A4C: 95.1 ml LV vol s, MOD A2C: 77.7 ml LV vol s, MOD A4C: 67.8 ml LV SV MOD A2C:     45.3 ml LV SV MOD A4C:     95.1 ml LV SV MOD BP:      34.7 ml RIGHT VENTRICLE RV S prime:     7.05 cm/s TAPSE (M-mode): 1.3 cm LEFT ATRIUM             Index        RIGHT ATRIUM           Index LA diam:        3.00 cm 1.79 cm/m   RA Area:      27.40 cm LA Vol (A2C):   43.1 ml 25.72 ml/m  RA Volume:   91.10 ml  54.37 ml/m LA Vol (A4C):   47.1 ml 28.11 ml/m LA Biplane Vol: 46.2 ml 27.57 ml/m  AORTIC VALVE             PULMONIC VALVE LVOT Vmax:   80.90 cm/s  PR End Diast Vel: 5.34 msec LVOT Vmean:  49.300 cm/s LVOT VTI:    0.171 m  AORTA Ao Root diam: 3.10 cm Ao Asc diam:  3.40 cm MITRAL VALVE                TRICUSPID VALVE MV Area (PHT): 3.65 cm     TR Peak grad:   26.0 mmHg MV Decel Time: 208 msec  TR Vmax:        255.00 cm/s MV E velocity: 101.00 cm/s MV A velocity: 87.90 cm/s   SHUNTS MV E/A ratio:  1.15         Systemic VTI:  0.17 m                             Systemic Diam: 1.70 cm Eleonore Chiquito MD Electronically signed by Eleonore Chiquito MD Signature Date/Time: 07/29/2022/1:28:42 PM    Final    CT Cervical Spine Wo Contrast  Result Date: 07/29/2022 CLINICAL DATA:  72 year old female status post fall 2 weeks ago with persistent dizziness, nausea. Vomiting and decreased appetite. Dialysis patient. EXAM: CT CERVICAL SPINE WITHOUT CONTRAST TECHNIQUE: Multidetector CT imaging of the cervical spine was performed without intravenous contrast. Multiplanar CT image reconstructions were also generated. RADIATION DOSE REDUCTION: This exam was performed according to the departmental dose-optimization program which includes automated exposure control, adjustment of the mA and/or kV according to patient size and/or use of iterative reconstruction technique. COMPARISON:  Head CT today reported separately. Cervical spine CT 10/27/2020. FINDINGS: Alignment: Chronic anterolisthesis of C4 on C5. Chronic straightening of cervical lordosis. Less reversal compared to 2022. Cervicothoracic junction alignment is within normal limits. Bilateral posterior element alignment is within normal limits. Skull base and vertebrae: Stable bone mineralization. Visualized skull base is intact. No atlanto-occipital dissociation. C1 and C2 appear intact and aligned. Chronic endplate  erosions [id number], C5-C6. Chronic C6 inferior endplate compression. Unilateral chronic C4-C5 facet erosion. Suspect chronic dialysis related spondyloarthropathy. No acute osseous abnormality identified. Soft tissues and spinal canal: No prevertebral fluid or swelling. No visible canal hematoma. Disc levels: Chronic degeneration C4-C5 through C6-C7. Mild multifactorial spinal stenosis suspected at C4-C5. Upper chest: Osteopenia. No acute osseous abnormality identified. Negative lung apices. Small volume retained gas and secretions in the upper thoracic esophagus. IMPRESSION: 1. No acute traumatic injury identified in the cervical spine. 2. 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 Mild multifactorial spinal stenosis suspected at C4-C5. Electronically Signed   By: Genevie Ann M.D.   On: 07/29/2022 06:03   CT Head Wo Contrast  Result Date: 07/29/2022 CLINICAL DATA:  72 year old female status post fall 2 weeks ago with persistent dizziness, nausea. Vomiting and decreased appetite. Dialysis patient. EXAM: CT HEAD WITHOUT CONTRAST TECHNIQUE: Contiguous axial images were obtained from the base of the skull through the vertex without intravenous contrast. RADIATION DOSE REDUCTION: This exam was performed according to the departmental dose-optimization program which includes automated exposure control, adjustment of the mA and/or kV according to patient size and/or use of iterative reconstruction technique. COMPARISON:  Head CT 10/27/2020.  Brain MRI 07/27/2020. FINDINGS: Brain: Stable cerebral volume. No midline shift, ventriculomegaly, mass effect, evidence of mass lesion, intracranial hemorrhage or evidence of cortically based acute infarction. Stable and normal for age gray-white differentiation. No convincing encephalomalacia. Vascular: Extensive Calcified atherosclerosis at the skull base. No suspicious intracranial vascular hyperdensity. Skull: No acute osseous abnormality identified.  Sinuses/Orbits: Visualized paranasal sinuses and mastoids are stable and well aerated. Other: Calcified scalp vessel atherosclerosis. No orbit or scalp soft tissue injury identified. IMPRESSION: 1. No recent traumatic injury identified. Stable and normal for age noncontrast CT appearance of the brain. 2. Advanced calcified atherosclerosis compatible with End stage renal disease. Electronically Signed   By: Genevie Ann M.D.   On: 07/29/2022 05:58   DG Chest Port 1 View  Result Date: 07/29/2022 CLINICAL DATA:  72 year old female  with history of shortness of breath. EXAM: PORTABLE CHEST 1 VIEW COMPARISON:  Chest x-ray 04/27/2022. FINDINGS: Lung volumes are normal. No consolidative airspace disease. No pleural effusions. No pneumothorax. No evidence of pulmonary edema. Heart size is mildly enlarged (unchanged). Upper mediastinal contours are within normal limits. Atherosclerotic calcifications are noted in the thoracic aorta. Vascular stent projecting over the left axillary region. IMPRESSION: 1. No radiographic evidence of acute cardiopulmonary disease. 2. Mild cardiomegaly. 3. Aortic atherosclerosis. Electronically Signed   By: Vinnie Langton M.D.   On: 07/29/2022 05:16    Cardiac Studies    ECHO: 07/29/2022  1. Left ventricular ejection fraction, by estimation, is 45 to 50%. The  left ventricle has mildly decreased function. The left ventricle  demonstrates global hypokinesis. There is mild concentric left ventricular  hypertrophy. Left ventricular diastolic  parameters are consistent with Grade II diastolic dysfunction  (pseudonormalization).   2. Right ventricular systolic function is mildly reduced. The right  ventricular size is mildly enlarged. There is mildly elevated pulmonary  artery systolic pressure. The estimated right ventricular systolic  pressure is [id number] mmHg.   3. The mitral valve is grossly normal. Trivial mitral valve  regurgitation. No evidence of mitral stenosis.   4. The aortic  valve is tricuspid. Aortic valve regurgitation is not  visualized. Aortic valve sclerosis is present, with no evidence of aortic  valve stenosis.   5. The inferior vena cava is dilated in size with <50% respiratory  variability, suggesting right atrial pressure of 15 mmHg.   Patient Profile      KOLLEEN DURRETTE is a 72 y.o. female with a hx of history of ESRD on dialysis, paroxysmal A-fib, PE/DVT, CAD s/p PCI, lower extremity necrotizing infctions s/p amputations, combined systolic and diastolic heart failure, dementia, bipolar, COPD, T2DM, HTN, and HLD  who is being seen 07/29/2022 for the evaluation of on going chest pressure x 1 week at the request of Dr. Saverio Danker.     Assessment & Plan    CAD: History of PCI with stenting to her ramus vessel, cath August 2019 with moderate LAD and diagonal stenosis, heavily calcified ostial circumflex with only 65% or greater stenosis mild RCA disease.  The circumflex stenosis was felt not to be optimal for treatment with per cutaneous coronary intervention due to the ostial nature and ostial ramus stent.  And has mild troponin elevation.  She has ST abnormality laterally most likely contributed by her circumflex stenosis.  She is now on IV heparin with her last dose of Eliquis yesterday.  Will try to titrate medications.  However if she continues to experience recurrent chest pain symptomatology, definitive catheterization may be necessary.  Increase metoprolol to 25 mg and increase isosorbide to 30 mg. End-stage renal disease: Patient had dialysis today. Combined systolic and diastolic heart failure.  Echo shows EF 45 to 50% with global hypokinesis and grade 2 diastolic dysfunction. Chronic anticoagulation: Currently on Eliquis 2.5 mg twice a day with dose reduced due to prior bleeding events.  She is presently in sinus rhythm. Hyperlipidemia, on atorvastatin 40 mg PVD: Status post first and second toe amputation of left foot and mid metatarsal amputation right foot      For questions or updates, please contact [place] Please consult [email or web address] for contact info under        Signed, Shelva Majestic, MD  07/30/2022, 5:52 PM

## 2022-07-30 NOTE — H&P (View-Only) (Signed)
Rounding Note    [name] Date of Encounter: 07/30/2022  [facility] Cardiologist: Dr. Daneen Schick  Subjective   Back from dialysis; she continues to experience some mild chest discomfort.  Is now on IV heparin.  Inpatient Medications    Scheduled Meds:  amLODipine  2.5 mg Oral Daily   ARIPiprazole  12 mg Oral QHS   atorvastatin  40 mg Oral QHS   Chlorhexidine Gluconate Cloth  6 each Topical Q0600   clopidogrel  75 mg Oral Daily   ferric citrate  420 mg Oral BID WC   gabapentin  100 mg Oral BID   insulin aspart  0-6 Units Subcutaneous TID WC   insulin detemir  10 Units Subcutaneous QHS   isosorbide mononitrate  15 mg Oral Q breakfast   lidocaine  1 patch Transdermal Q24H   metoprolol succinate  12.5 mg Oral Daily   pantoprazole  40 mg Oral BID AC   umeclidinium bromide  2 puff Inhalation q morning   Continuous Infusions:  ferric gluconate (FERRLECIT) IVPB Stopped (07/30/22 1405)   heparin 900 Units/hr (07/30/22 1000)   PRN Meds: acetaminophen, albuterol, nitroGLYCERIN   Vital Signs    Vitals:   07/30/22 1200 07/30/22 1205 07/30/22 1225 07/30/22 1347  BP: (!) 123/57 (!) 167/52 (!) 165/78 (!) 144/90  Pulse: (!) 57 74 75 77  Resp: '16 10 16 20  '$ Temp:   97.6 F (36.4 C) 98.1 F (36.7 C)  TempSrc:    Oral  SpO2: 99% 100% 99% 100%  Weight:   84.1 kg   Height:        Intake/Output Summary (Last 24 hours) at 07/30/2022 1752 Last data filed at 07/30/2022 1225 Gross per 24 hour  Intake 698.08 ml  Output 3000 ml  Net -2301.92 ml      07/30/2022   12:25 PM 07/30/2022    8:27 AM 07/29/2022    4:22 AM  Last 3 Weights  Weight (lbs) 185 lb 6.5 oz 192 lb 0.3 oz 138 lb 14.2 oz  Weight (kg) 84.1 kg 87.1 kg 63 kg      Telemetry    Sinus with isolated PVC, heart rate 72 bpm- Personally Reviewed  ECG    Sinus rhythm at 66, PACs, lateral ST abnormality- Personally Reviewed  Physical Exam   GEN: No acute distress.   Neck: No JVD Cardiac:  Regular rhythm with occasional ectopic complex. Respiratory: Decreased breath sounds at bases. GI: Soft, nontender, non-distended  MS: Left first and second toe amputation, right foot metatarsal amputations Neuro:  Nonfocal  Psych: Normal affect   Labs    High Sensitivity Troponin:   Recent Labs  Lab 07/29/22 0443 07/29/22 0658  TROPONINIHS 200* 167*     Chemistry Recent Labs  Lab 07/29/22 0443 07/29/22 1213 07/30/22 0559  NA 136  --  137  K 3.7  --  3.6  CL 96*  --  96*  CO2 25  --  22  GLUCOSE 171*  --  48*  BUN 25*  --  40*  CREATININE 4.06*  --  5.54*  CALCIUM 9.4  --  9.3  MG  --  2.3  --   PROT 7.0  --   --   ALBUMIN 3.1*  --  3.0*  AST 31  --   --   ALT 19  --   --   ALKPHOS 210*  --   --   BILITOT 0.7  --   --  GFRNONAA 11*  --  8*  ANIONGAP 15  --  19*    Lipids No results for input(s): "CHOL", "TRIG", "HDL", "LABVLDL", "LDLCALC", "CHOLHDL" in the last 168 hours.  Hematology Recent Labs  Lab 07/29/22 0443 07/30/22 0559  WBC 6.0 4.3  RBC 3.95 3.98  HGB 11.9* 11.6*  HCT 37.5 37.5  MCV 94.9 94.2  MCH 30.1 29.1  MCHC 31.7 30.9  RDW 19.8* 19.3*  PLT 184 171   Thyroid No results for input(s): "TSH", "FREET4" in the last 168 hours.  BNP Recent Labs  Lab 07/29/22 0443  BNP 1,147.2*    DDimer No results for input(s): "DDIMER" in the last 168 hours.   Radiology    ECHOCARDIOGRAM COMPLETE  Result Date: 07/29/2022    ECHOCARDIOGRAM REPORT   Patient Name:   Robin [name] Date of Exam: 07/29/2022 Medical Rec #:  [id number]    Height:       64.0 in Accession #:    [id number]   Weight:       138.9 lb Date of Birth:  [birth year]    BSA:          1.676 m Patient Age:    72 years     BP:           128/67 mmHg Patient Gender: F            HR:           71 bpm. Exam Location:  Inpatient Procedure: 2D Echo, Cardiac Doppler and Color Doppler Indications:    Chest Pain R07.9                 Elevated Troponin  History:        Patient has prior history of Echocardiogram  examinations, most                 recent 04/15/2019. CHF, Arrythmias:Tachycardia,                 Signs/Symptoms:Murmur and Chest Pain; Risk Factors:Dyslipidemia,                 Sleep Apnea, Hypertension and Current Smoker.  Sonographer:    Greer Pickerel Referring Phys: [id number] ELIZABETH REES  Sonographer Comments: Image acquisition challenging due to respiratory motion. IMPRESSIONS  1. Left ventricular ejection fraction, by estimation, is 45 to 50%. The left ventricle has mildly decreased function. The left ventricle demonstrates global hypokinesis. There is mild concentric left ventricular hypertrophy. Left ventricular diastolic parameters are consistent with Grade II diastolic dysfunction (pseudonormalization).  2. Right ventricular systolic function is mildly reduced. The right ventricular size is mildly enlarged. There is mildly elevated pulmonary artery systolic pressure. The estimated right ventricular systolic pressure is [id number] mmHg.  3. The mitral valve is grossly normal. Trivial mitral valve regurgitation. No evidence of mitral stenosis.  4. The aortic valve is tricuspid. Aortic valve regurgitation is not visualized. Aortic valve sclerosis is present, with no evidence of aortic valve stenosis.  5. The inferior vena cava is dilated in size with <50% respiratory variability, suggesting right atrial pressure of 15 mmHg. FINDINGS  Left Ventricle: Left ventricular ejection fraction, by estimation, is 45 to 50%. The left ventricle has mildly decreased function. The left ventricle demonstrates global hypokinesis. The left ventricular internal cavity size was normal in size. There is  mild concentric left ventricular hypertrophy. Left ventricular diastolic parameters are consistent with Grade II diastolic dysfunction (pseudonormalization). Right Ventricle: The right ventricular size is mildly enlarged.  No increase in right ventricular wall thickness. Right ventricular systolic function is mildly reduced. There is  mildly elevated pulmonary artery systolic pressure. The tricuspid regurgitant  velocity is 2.55 m/s, and with an assumed right atrial pressure of 15 mmHg, the estimated right ventricular systolic pressure is [id number] mmHg. Left Atrium: Left atrial size was normal in size. Right Atrium: Right atrial size was normal in size. Pericardium: There is no evidence of pericardial effusion. Mitral Valve: The mitral valve is grossly normal. Trivial mitral valve regurgitation. No evidence of mitral valve stenosis. Tricuspid Valve: The tricuspid valve is grossly normal. Tricuspid valve regurgitation is trivial. No evidence of tricuspid stenosis. Aortic Valve: The aortic valve is tricuspid. Aortic valve regurgitation is not visualized. Aortic valve sclerosis is present, with no evidence of aortic valve stenosis. Pulmonic Valve: The pulmonic valve was grossly normal. Pulmonic valve regurgitation is trivial. No evidence of pulmonic stenosis. Aorta: The aortic root and ascending aorta are structurally normal, with no evidence of dilitation. Venous: The inferior vena cava is dilated in size with less than 50% respiratory variability, suggesting right atrial pressure of 15 mmHg. IAS/Shunts: The atrial septum is grossly normal.  LEFT VENTRICLE PLAX 2D LVIDd:         3.90 cm      Diastology LVIDs:         3.40 cm      LV e' medial:    3.65 cm/s LV PW:         1.20 cm      LV E/e' medial:  27.7 LV IVS:        1.30 cm      LV e' lateral:   5.27 cm/s LVOT diam:     1.70 cm      LV E/e' lateral: 19.2 LV SV:         39 LV SV Index:   23 LVOT Area:     2.27 cm  LV Volumes (MOD) LV vol d, MOD A2C: 123.0 ml LV vol d, MOD A4C: 95.1 ml LV vol s, MOD A2C: 77.7 ml LV vol s, MOD A4C: 67.8 ml LV SV MOD A2C:     45.3 ml LV SV MOD A4C:     95.1 ml LV SV MOD BP:      34.7 ml RIGHT VENTRICLE RV S prime:     7.05 cm/s TAPSE (M-mode): 1.3 cm LEFT ATRIUM             Index        RIGHT ATRIUM           Index LA diam:        3.00 cm 1.79 cm/m   RA Area:      27.40 cm LA Vol (A2C):   43.1 ml 25.72 ml/m  RA Volume:   91.10 ml  54.37 ml/m LA Vol (A4C):   47.1 ml 28.11 ml/m LA Biplane Vol: 46.2 ml 27.57 ml/m  AORTIC VALVE             PULMONIC VALVE LVOT Vmax:   80.90 cm/s  PR End Diast Vel: 5.34 msec LVOT Vmean:  49.300 cm/s LVOT VTI:    0.171 m  AORTA Ao Root diam: 3.10 cm Ao Asc diam:  3.40 cm MITRAL VALVE                TRICUSPID VALVE MV Area (PHT): 3.65 cm     TR Peak grad:   26.0 mmHg MV Decel Time: 208 msec  TR Vmax:        255.00 cm/s MV E velocity: 101.00 cm/s MV A velocity: 87.90 cm/s   SHUNTS MV E/A ratio:  1.15         Systemic VTI:  0.17 m                             Systemic Diam: 1.70 cm Eleonore Chiquito MD Electronically signed by Eleonore Chiquito MD Signature Date/Time: 07/29/2022/1:28:42 PM    Final    CT Cervical Spine Wo Contrast  Result Date: 07/29/2022 CLINICAL DATA:  72 year old female status post fall 2 weeks ago with persistent dizziness, nausea. Vomiting and decreased appetite. Dialysis patient. EXAM: CT CERVICAL SPINE WITHOUT CONTRAST TECHNIQUE: Multidetector CT imaging of the cervical spine was performed without intravenous contrast. Multiplanar CT image reconstructions were also generated. RADIATION DOSE REDUCTION: This exam was performed according to the departmental dose-optimization program which includes automated exposure control, adjustment of the mA and/or kV according to patient size and/or use of iterative reconstruction technique. COMPARISON:  Head CT today reported separately. Cervical spine CT 10/27/2020. FINDINGS: Alignment: Chronic anterolisthesis of C4 on C5. Chronic straightening of cervical lordosis. Less reversal compared to 2022. Cervicothoracic junction alignment is within normal limits. Bilateral posterior element alignment is within normal limits. Skull base and vertebrae: Stable bone mineralization. Visualized skull base is intact. No atlanto-occipital dissociation. C1 and C2 appear intact and aligned. Chronic endplate  erosions [id number], C5-C6. Chronic C6 inferior endplate compression. Unilateral chronic C4-C5 facet erosion. Suspect chronic dialysis related spondyloarthropathy. No acute osseous abnormality identified. Soft tissues and spinal canal: No prevertebral fluid or swelling. No visible canal hematoma. Disc levels: Chronic degeneration C4-C5 through C6-C7. Mild multifactorial spinal stenosis suspected at C4-C5. Upper chest: Osteopenia. No acute osseous abnormality identified. Negative lung apices. Small volume retained gas and secretions in the upper thoracic esophagus. IMPRESSION: 1. No acute traumatic injury identified in the cervical spine. 2. 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 Mild multifactorial spinal stenosis suspected at C4-C5. Electronically Signed   By: Genevie Ann M.D.   On: 07/29/2022 06:03   CT Head Wo Contrast  Result Date: 07/29/2022 CLINICAL DATA:  72 year old female status post fall 2 weeks ago with persistent dizziness, nausea. Vomiting and decreased appetite. Dialysis patient. EXAM: CT HEAD WITHOUT CONTRAST TECHNIQUE: Contiguous axial images were obtained from the base of the skull through the vertex without intravenous contrast. RADIATION DOSE REDUCTION: This exam was performed according to the departmental dose-optimization program which includes automated exposure control, adjustment of the mA and/or kV according to patient size and/or use of iterative reconstruction technique. COMPARISON:  Head CT 10/27/2020.  Brain MRI 07/27/2020. FINDINGS: Brain: Stable cerebral volume. No midline shift, ventriculomegaly, mass effect, evidence of mass lesion, intracranial hemorrhage or evidence of cortically based acute infarction. Stable and normal for age gray-white differentiation. No convincing encephalomalacia. Vascular: Extensive Calcified atherosclerosis at the skull base. No suspicious intracranial vascular hyperdensity. Skull: No acute osseous abnormality identified.  Sinuses/Orbits: Visualized paranasal sinuses and mastoids are stable and well aerated. Other: Calcified scalp vessel atherosclerosis. No orbit or scalp soft tissue injury identified. IMPRESSION: 1. No recent traumatic injury identified. Stable and normal for age noncontrast CT appearance of the brain. 2. Advanced calcified atherosclerosis compatible with End stage renal disease. Electronically Signed   By: Genevie Ann M.D.   On: 07/29/2022 05:58   DG Chest Port 1 View  Result Date: 07/29/2022 CLINICAL DATA:  72 year old female  with history of shortness of breath. EXAM: PORTABLE CHEST 1 VIEW COMPARISON:  Chest x-ray 04/27/2022. FINDINGS: Lung volumes are normal. No consolidative airspace disease. No pleural effusions. No pneumothorax. No evidence of pulmonary edema. Heart size is mildly enlarged (unchanged). Upper mediastinal contours are within normal limits. Atherosclerotic calcifications are noted in the thoracic aorta. Vascular stent projecting over the left axillary region. IMPRESSION: 1. No radiographic evidence of acute cardiopulmonary disease. 2. Mild cardiomegaly. 3. Aortic atherosclerosis. Electronically Signed   By: Vinnie Langton M.D.   On: 07/29/2022 05:16    Cardiac Studies    ECHO: 07/29/2022  1. Left ventricular ejection fraction, by estimation, is 45 to 50%. The  left ventricle has mildly decreased function. The left ventricle  demonstrates global hypokinesis. There is mild concentric left ventricular  hypertrophy. Left ventricular diastolic  parameters are consistent with Grade II diastolic dysfunction  (pseudonormalization).   2. Right ventricular systolic function is mildly reduced. The right  ventricular size is mildly enlarged. There is mildly elevated pulmonary  artery systolic pressure. The estimated right ventricular systolic  pressure is [id number] mmHg.   3. The mitral valve is grossly normal. Trivial mitral valve  regurgitation. No evidence of mitral stenosis.   4. The aortic  valve is tricuspid. Aortic valve regurgitation is not  visualized. Aortic valve sclerosis is present, with no evidence of aortic  valve stenosis.   5. The inferior vena cava is dilated in size with <50% respiratory  variability, suggesting right atrial pressure of 15 mmHg.   Patient Profile      Robin [name] is a 72 y.o. female with a hx of history of ESRD on dialysis, paroxysmal A-fib, PE/DVT, CAD s/p PCI, lower extremity necrotizing infctions s/p amputations, combined systolic and diastolic heart failure, dementia, bipolar, COPD, T2DM, HTN, and HLD  who is being seen 07/29/2022 for the evaluation of on going chest pressure x 1 week at the request of Dr. Saverio Danker.     Assessment & Plan    CAD: History of PCI with stenting to her ramus vessel, cath August 2019 with moderate LAD and diagonal stenosis, heavily calcified ostial circumflex with only 65% or greater stenosis mild RCA disease.  The circumflex stenosis was felt not to be optimal for treatment with per cutaneous coronary intervention due to the ostial nature and ostial ramus stent.  And has mild troponin elevation.  She has ST abnormality laterally most likely contributed by her circumflex stenosis.  She is now on IV heparin with her last dose of Eliquis yesterday.  Will try to titrate medications.  However if she continues to experience recurrent chest pain symptomatology, definitive catheterization may be necessary.  Increase metoprolol to 25 mg and increase isosorbide to 30 mg. End-stage renal disease: Patient had dialysis today. Combined systolic and diastolic heart failure.  Echo shows EF 45 to 50% with global hypokinesis and grade 2 diastolic dysfunction. Chronic anticoagulation: Currently on Eliquis 2.5 mg twice a day with dose reduced due to prior bleeding events.  She is presently in sinus rhythm. Hyperlipidemia, on atorvastatin 40 mg PVD: Status post first and second toe amputation of left foot and mid metatarsal amputation right foot      For questions or updates, please contact [place] Please consult [email or web address] for contact info under        Signed, Shelva Majestic, MD  07/30/2022, 5:52 PM

## 2022-07-30 NOTE — Progress Notes (Signed)
Dialysis progress note      Sent an APTT draw from the hemodialysis lines per the pharmacitst order----it is known by the pharmacy that the pt has 900u or 9cc of Heparin IV gett infusing when this draw took place

## 2022-07-30 NOTE — Progress Notes (Deleted)
Inpatient Diabetes Program Recommendations  AACE/ADA: New Consensus Statement on Inpatient Glycemic Control (2015)  Target Ranges:  Prepandial:   less than 140 mg/dL      Peak postprandial:   less than 180 mg/dL (1-2 hours)      Critically ill patients:  140 - 180 mg/dL   Lab Results  Component Value Date   GLUCAP 88 07/30/2022   HGBA1C 7.7 (H) 07/29/2022    Review of Glycemic Control  Latest Reference Range & Units 07/29/22 16:11 07/29/22 21:34 07/30/22 06:04 07/30/22 06:32 07/30/22 07:09  Glucose-Capillary 70 - 99 mg/dL 95 157 (H) 49 (L) 70 88   Diabetes history: DM  Outpatient Diabetes medications:  Novolog 6 units tid with meals Levemir 15 units daily Current orders for Inpatient glycemic control:  Novolog 0-6 units tid with meals Levemir 15 units q HS  Inpatient Diabetes Program Recommendations:    Consider reducing Levemir to 10 units q HS.   Thanks,  Adah Perl, RN, BC-ADM Inpatient Diabetes Coordinator Pager [phone]  (8a-5p)

## 2022-07-31 ENCOUNTER — Encounter (HOSPITAL_COMMUNITY)
Admission: EM | Disposition: A | Discharge status: Home Health | Payer: Self-pay | Source: Home / Self Care | Attending: Internal Medicine

## 2022-07-31 DIAGNOSIS — I2510 Atherosclerotic heart disease of native coronary artery without angina pectoris: Secondary | ICD-10-CM | POA: Diagnosis not present

## 2022-07-31 DIAGNOSIS — I214 Non-ST elevation (NSTEMI) myocardial infarction: Secondary | ICD-10-CM | POA: Diagnosis not present

## 2022-07-31 DIAGNOSIS — I491 Atrial premature depolarization: Secondary | ICD-10-CM | POA: Diagnosis not present

## 2022-07-31 DIAGNOSIS — E785 Hyperlipidemia, unspecified: Secondary | ICD-10-CM | POA: Diagnosis not present

## 2022-07-31 DIAGNOSIS — F17210 Nicotine dependence, cigarettes, uncomplicated: Secondary | ICD-10-CM | POA: Diagnosis not present

## 2022-07-31 DIAGNOSIS — R0789 Other chest pain: Secondary | ICD-10-CM | POA: Diagnosis not present

## 2022-07-31 HISTORY — PX: INTRAVASCULAR PRESSURE WIRE/FFR STUDY: CATH118243

## 2022-07-31 HISTORY — PX: LEFT HEART CATH AND CORONARY ANGIOGRAPHY: CATH118249

## 2022-07-31 LAB — CBC
HCT: 40.2 % (ref 36.0–46.0)
Hemoglobin: 12.6 g/dL (ref 12.0–15.0)
MCH: 29.7 pg (ref 26.0–34.0)
MCHC: 31.3 g/dL (ref 30.0–36.0)
MCV: 94.8 fL (ref 80.0–100.0)
Platelets: 194 10*3/uL (ref 150–400)
RBC: 4.24 MIL/uL (ref 3.87–5.11)
RDW: 19.7 % — ABNORMAL HIGH (ref 11.5–15.5)
WBC: 4.5 10*3/uL (ref 4.0–10.5)
nRBC: 0 % (ref 0.0–0.2)

## 2022-07-31 LAB — MAGNESIUM: Magnesium: 2.2 mg/dL (ref 1.7–2.4)

## 2022-07-31 LAB — APTT: aPTT: 30 seconds (ref 24–36)

## 2022-07-31 LAB — GLUCOSE, CAPILLARY
Glucose-Capillary: 118 mg/dL — ABNORMAL HIGH (ref 70–99)
Glucose-Capillary: 110 mg/dL — ABNORMAL HIGH (ref 70–99)

## 2022-07-31 LAB — HEPARIN LEVEL (UNFRACTIONATED): Heparin Unfractionated: 0.23 IU/mL — ABNORMAL LOW (ref 0.30–0.70)

## 2022-07-31 LAB — POCT ACTIVATED CLOTTING TIME
Activated Clotting Time: 336 seconds
Activated Clotting Time: 407 seconds
Activated Clotting Time: 266 s
Activated Clotting Time: 244 seconds
Activated Clotting Time: 212 s
Activated Clotting Time: 196 s
Activated Clotting Time: 179 seconds
Activated Clotting Time: 168 seconds

## 2022-07-31 LAB — TROPONIN I (HIGH SENSITIVITY): Troponin I (High Sensitivity): 81 ng/L — ABNORMAL HIGH (ref <18)

## 2022-07-31 SURGERY — LEFT HEART CATH AND CORONARY ANGIOGRAPHY
Anesthesia: LOCAL

## 2022-07-31 MED ORDER — HEPARIN SODIUM (PORCINE) 1000 UNIT/ML IJ SOLN
INTRAMUSCULAR | Status: DC | PRN
  Administered 2022-07-31: 6000 [IU] via INTRAVENOUS

## 2022-07-31 MED ORDER — FENTANYL CITRATE (PF) 100 MCG/2ML IJ SOLN
INTRAMUSCULAR | Status: AC
  Filled 2022-07-31: qty 2

## 2022-07-31 MED ORDER — SODIUM CHLORIDE 0.9% FLUSH: 3.0000 mL | INTRAVENOUS | Status: DC | PRN

## 2022-07-31 MED ORDER — FENTANYL CITRATE (PF) 100 MCG/2ML IJ SOLN
INTRAMUSCULAR | Status: DC | PRN
  Administered 2022-07-31: 25 ug via INTRAVENOUS

## 2022-07-31 MED ORDER — ASPIRIN 81 MG PO CHEW
CHEWABLE_TABLET | ORAL | Status: AC
  Filled 2022-07-31: qty 1

## 2022-07-31 MED ORDER — ATORVASTATIN CALCIUM 80 MG PO TABS
80.0000 mg | ORAL_TABLET | Freq: Every day | ORAL | Status: DC
  Administered 2022-07-31 – 2022-08-04 (×5): 80 mg via ORAL
  Filled 2022-07-31 (×5): qty 1

## 2022-07-31 MED ORDER — SODIUM CHLORIDE 0.9% FLUSH
3.0000 mL | Freq: Two times a day (BID) | INTRAVENOUS | Status: DC
  Administered 2022-08-01 – 2022-08-02 (×2): 3 mL via INTRAVENOUS

## 2022-07-31 MED ORDER — ASPIRIN 81 MG PO CHEW: 81.0000 mg | CHEWABLE_TABLET | ORAL | Status: DC

## 2022-07-31 MED ORDER — MIDAZOLAM HCL 2 MG/2ML IJ SOLN
INTRAMUSCULAR | Status: DC | PRN
  Administered 2022-07-31: 1 mg via INTRAVENOUS

## 2022-07-31 MED ORDER — SODIUM CHLORIDE 0.9% FLUSH
3.0000 mL | Freq: Two times a day (BID) | INTRAVENOUS | Status: DC
  Administered 2022-08-01 – 2022-08-04 (×7): 3 mL via INTRAVENOUS

## 2022-07-31 MED ORDER — SODIUM CHLORIDE 0.9 % IV SOLN: 250.0000 mL | INTRAVENOUS | Status: DC | PRN

## 2022-07-31 MED ORDER — ASPIRIN 81 MG PO CHEW
CHEWABLE_TABLET | ORAL | Status: DC | PRN
  Administered 2022-07-31: 81 mg via ORAL

## 2022-07-31 MED ORDER — HYDRALAZINE HCL 20 MG/ML IJ SOLN: 10.0000 mg | INTRAMUSCULAR | Status: AC | PRN

## 2022-07-31 MED ORDER — ACETAMINOPHEN 325 MG PO TABS
ORAL_TABLET | ORAL | Status: AC
  Filled 2022-07-31: qty 2

## 2022-07-31 MED ORDER — MIDAZOLAM HCL 2 MG/2ML IJ SOLN
INTRAMUSCULAR | Status: AC
  Filled 2022-07-31: qty 2

## 2022-07-31 MED ORDER — HEPARIN (PORCINE) IN NACL 1000-0.9 UT/500ML-% IV SOLN
INTRAVENOUS | Status: DC | PRN
  Administered 2022-07-31 (×2): 500 mL

## 2022-07-31 MED ORDER — LABETALOL HCL 5 MG/ML IV SOLN: 10.0000 mg | INTRAVENOUS | Status: AC | PRN

## 2022-07-31 MED ORDER — ACETAMINOPHEN 325 MG PO TABS
650.0000 mg | ORAL_TABLET | ORAL | Status: DC | PRN
  Administered 2022-07-31: 650 mg via ORAL

## 2022-07-31 MED ORDER — LIDOCAINE HCL (PF) 1 % IJ SOLN
INTRAMUSCULAR | Status: AC
  Filled 2022-07-31: qty 30

## 2022-07-31 MED ORDER — IOHEXOL 350 MG/ML SOLN
INTRAVENOUS | Status: DC | PRN
  Administered 2022-07-31: 75 mL

## 2022-07-31 MED ORDER — LIDOCAINE HCL (PF) 1 % IJ SOLN
INTRAMUSCULAR | Status: DC | PRN
  Administered 2022-07-31: 15 mL via INTRADERMAL

## 2022-07-31 MED ORDER — SODIUM CHLORIDE 0.9 % IV SOLN: INTRAVENOUS | Status: DC

## 2022-07-31 SURGICAL SUPPLY — 14 items
CATH INFINITI 5FR MULTPACK ANG (CATHETERS) IMPLANT
CATH LAUNCHER 6FR EBU3.5 (CATHETERS) IMPLANT
CATH LAUNCHER 6FR JR4 (CATHETERS) IMPLANT
GUIDEWIRE PRESSURE X 175 (WIRE) IMPLANT
KIT ESSENTIALS PG (KITS) IMPLANT
KIT HEART LEFT (KITS) ×1 IMPLANT
PACK CARDIAC CATHETERIZATION (CUSTOM PROCEDURE TRAY) ×1 IMPLANT
SHEATH PINNACLE 5F 10CM (SHEATH) IMPLANT
SHEATH PINNACLE 6F 10CM (SHEATH) IMPLANT
SHEATH PROBE COVER 6X72 (BAG) IMPLANT
TRANSDUCER W/STOPCOCK (MISCELLANEOUS) ×1 IMPLANT
TUBING CIL FLEX 10 FLL-RA (TUBING) ×1 IMPLANT
WIRE EMERALD 3MM-J .035X150CM (WIRE) IMPLANT
WIRE MICRO SET SILHO 5FR 7 (SHEATH) IMPLANT

## 2022-07-31 NOTE — Progress Notes (Addendum)
Rounding Note    [name] Date of Encounter: 07/31/2022  [place] Cardiologist: Shelva Majestic, MD   Subjective   Still having ongoing chest pain. Given SL nitro this morning, some relief.   Inpatient Medications    Scheduled Meds:  amLODipine  2.5 mg Oral Daily   ARIPiprazole  12 mg Oral QHS   atorvastatin  80 mg Oral QHS   Chlorhexidine Gluconate Cloth  6 each Topical Q0600   clopidogrel  75 mg Oral Daily   ferric citrate  420 mg Oral BID WC   gabapentin  100 mg Oral BID   insulin aspart  0-6 Units Subcutaneous TID WC   insulin detemir  10 Units Subcutaneous QHS   isosorbide mononitrate  30 mg Oral Q breakfast   lidocaine  1 patch Transdermal Q24H   metoprolol succinate  25 mg Oral Daily   pantoprazole  40 mg Oral BID AC   umeclidinium bromide  2 puff Inhalation q morning   Continuous Infusions:  ferric gluconate (FERRLECIT) IVPB Stopped (07/30/22 1405)   heparin 850 Units/hr (07/31/22 0600)   PRN Meds: acetaminophen, albuterol, nitroGLYCERIN   Vital Signs    Vitals:   07/31/22 0523 07/31/22 0558 07/31/22 0608 07/31/22 0727  BP: 135/64 119/86 139/76 115/63  Pulse: 63 79 77 62  Resp: 19   19  Temp: 97.7 F (36.5 C)   97.6 F (36.4 C)  TempSrc: Oral   Oral  SpO2: 94% 96% 96% 97%  Weight:      Height:        Intake/Output Summary (Last 24 hours) at 07/31/2022 0831 Last data filed at 07/31/2022 0600 Gross per 24 hour  Intake 509.35 ml  Output 3000 ml  Net -2490.65 ml      07/30/2022   12:25 PM 07/30/2022    8:27 AM 07/29/2022    4:22 AM  Last 3 Weights  Weight (lbs) 185 lb 6.5 oz 192 lb 0.3 oz 138 lb 14.2 oz  Weight (kg) 84.1 kg 87.1 kg 63 kg      Telemetry    Sinus Rhythm - Personally Reviewed  ECG    Sinus Rhythm,  68 bpm, lateral ST changes - Personally Reviewed  Physical Exam   GEN: No acute distress.   Neck: No JVD Cardiac: RRR, no murmurs, rubs, or gallops.  Respiratory: Clear to auscultation  bilaterally. GI: Soft, nontender, non-distended  MS: No edema, Left first and second toe amputation, right foot metatarsal amputations  Neuro:  Nonfocal  Psych: Normal affect   Labs    High Sensitivity Troponin:   Recent Labs  Lab 07/29/22 0443 07/29/22 0658  TROPONINIHS 200* 167*     Chemistry Recent Labs  Lab 07/29/22 0443 07/29/22 1213 07/30/22 0559 07/30/22 1845  NA 136  --  137 135  K 3.7  --  3.6 3.8  CL 96*  --  96* 93*  CO2 25  --  22 28  GLUCOSE 171*  --  48* 209*  BUN 25*  --  40* 17  CREATININE 4.06*  --  5.54* 3.14*  CALCIUM 9.4  --  9.3 9.4  MG  --  2.3  --   --   PROT 7.0  --   --   --   ALBUMIN 3.1*  --  3.0*  --   AST 31  --   --   --   ALT 19  --   --   --  ALKPHOS 210*  --   --   --   BILITOT 0.7  --   --   --   GFRNONAA 11*  --  8* 15*  ANIONGAP 15  --  19* 14    Lipids No results for input(s): "CHOL", "TRIG", "HDL", "LABVLDL", "LDLCALC", "CHOLHDL" in the last 168 hours.  Hematology Recent Labs  Lab 07/29/22 0443 07/30/22 0559  WBC 6.0 4.3  RBC 3.95 3.98  HGB 11.9* 11.6*  HCT 37.5 37.5  MCV 94.9 94.2  MCH 30.1 29.1  MCHC 31.7 30.9  RDW 19.8* 19.3*  PLT 184 171   Thyroid No results for input(s): "TSH", "FREET4" in the last 168 hours.  BNP Recent Labs  Lab 07/29/22 0443  BNP 1,147.2*    DDimer No results for input(s): "DDIMER" in the last 168 hours.   Radiology    ECHOCARDIOGRAM COMPLETE  Result Date: 07/29/2022    ECHOCARDIOGRAM REPORT   Patient Name:   Robin [name] Date of Exam: 07/29/2022 Medical Rec #:  [id number]    Height:       64.0 in Accession #:    [id number]   Weight:       138.9 lb Date of Birth:  Jun 20, 1950    BSA:          1.676 m Patient Age:    44 years     BP:           128/67 mmHg Patient Gender: F            HR:           71 bpm. Exam Location:  Inpatient Procedure: 2D Echo, Cardiac Doppler and Color Doppler Indications:    Chest Pain R07.9                 Elevated Troponin  History:        Patient has prior history  of Echocardiogram examinations, most                 recent 04/15/2019. CHF, Arrythmias:Tachycardia,                 Signs/Symptoms:Murmur and Chest Pain; Risk Factors:Dyslipidemia,                 Sleep Apnea, Hypertension and Current Smoker.  Sonographer:    Greer Pickerel Referring Phys: [id number] ELIZABETH REES  Sonographer Comments: Image acquisition challenging due to respiratory motion. IMPRESSIONS  1. Left ventricular ejection fraction, by estimation, is 45 to 50%. The left ventricle has mildly decreased function. The left ventricle demonstrates global hypokinesis. There is mild concentric left ventricular hypertrophy. Left ventricular diastolic parameters are consistent with Grade II diastolic dysfunction (pseudonormalization).  2. Right ventricular systolic function is mildly reduced. The right ventricular size is mildly enlarged. There is mildly elevated pulmonary artery systolic pressure. The estimated right ventricular systolic pressure is [id number] mmHg.  3. The mitral valve is grossly normal. Trivial mitral valve regurgitation. No evidence of mitral stenosis.  4. The aortic valve is tricuspid. Aortic valve regurgitation is not visualized. Aortic valve sclerosis is present, with no evidence of aortic valve stenosis.  5. The inferior vena cava is dilated in size with <50% respiratory variability, suggesting right atrial pressure of 15 mmHg. FINDINGS  Left Ventricle: Left ventricular ejection fraction, by estimation, is 45 to 50%. The left ventricle has mildly decreased function. The left ventricle demonstrates global hypokinesis. The left ventricular internal cavity size was normal in size. There is  mild concentric left ventricular hypertrophy. Left ventricular diastolic parameters are consistent with Grade II diastolic dysfunction (pseudonormalization). Right Ventricle: The right ventricular size is mildly enlarged. No increase in right ventricular wall thickness. Right ventricular systolic function is mildly  reduced. There is mildly elevated pulmonary artery systolic pressure. The tricuspid regurgitant  velocity is 2.55 m/s, and with an assumed right atrial pressure of 15 mmHg, the estimated right ventricular systolic pressure is [id number] mmHg. Left Atrium: Left atrial size was normal in size. Right Atrium: Right atrial size was normal in size. Pericardium: There is no evidence of pericardial effusion. Mitral Valve: The mitral valve is grossly normal. Trivial mitral valve regurgitation. No evidence of mitral valve stenosis. Tricuspid Valve: The tricuspid valve is grossly normal. Tricuspid valve regurgitation is trivial. No evidence of tricuspid stenosis. Aortic Valve: The aortic valve is tricuspid. Aortic valve regurgitation is not visualized. Aortic valve sclerosis is present, with no evidence of aortic valve stenosis. Pulmonic Valve: The pulmonic valve was grossly normal. Pulmonic valve regurgitation is trivial. No evidence of pulmonic stenosis. Aorta: The aortic root and ascending aorta are structurally normal, with no evidence of dilitation. Venous: The inferior vena cava is dilated in size with less than 50% respiratory variability, suggesting right atrial pressure of 15 mmHg. IAS/Shunts: The atrial septum is grossly normal.  LEFT VENTRICLE PLAX 2D LVIDd:         3.90 cm      Diastology LVIDs:         3.40 cm      LV e' medial:    3.65 cm/s LV PW:         1.20 cm      LV E/e' medial:  27.7 LV IVS:        1.30 cm      LV e' lateral:   5.27 cm/s LVOT diam:     1.70 cm      LV E/e' lateral: 19.2 LV SV:         39 LV SV Index:   23 LVOT Area:     2.27 cm  LV Volumes (MOD) LV vol d, MOD A2C: 123.0 ml LV vol d, MOD A4C: 95.1 ml LV vol s, MOD A2C: 77.7 ml LV vol s, MOD A4C: 67.8 ml LV SV MOD A2C:     45.3 ml LV SV MOD A4C:     95.1 ml LV SV MOD BP:      34.7 ml RIGHT VENTRICLE RV S prime:     7.05 cm/s TAPSE (M-mode): 1.3 cm LEFT ATRIUM             Index        RIGHT ATRIUM           Index LA diam:        3.00 cm 1.79 cm/m    RA Area:     27.40 cm LA Vol (A2C):   43.1 ml 25.72 ml/m  RA Volume:   91.10 ml  54.37 ml/m LA Vol (A4C):   47.1 ml 28.11 ml/m LA Biplane Vol: 46.2 ml 27.57 ml/m  AORTIC VALVE             PULMONIC VALVE LVOT Vmax:   80.90 cm/s  PR End Diast Vel: 5.34 msec LVOT Vmean:  49.300 cm/s LVOT VTI:    0.171 m  AORTA Ao Root diam: 3.10 cm Ao Asc diam:  3.40 cm MITRAL VALVE  TRICUSPID VALVE MV Area (PHT): 3.65 cm     TR Peak grad:   26.0 mmHg MV Decel Time: 208 msec     TR Vmax:        255.00 cm/s MV E velocity: 101.00 cm/s MV A velocity: 87.90 cm/s   SHUNTS MV E/A ratio:  1.15         Systemic VTI:  0.17 m                             Systemic Diam: 1.70 cm Eleonore Chiquito MD Electronically signed by Eleonore Chiquito MD Signature Date/Time: 07/29/2022/1:28:42 PM    Final     Cardiac Studies   Echo: 07/29/2022  IMPRESSIONS     1. Left ventricular ejection fraction, by estimation, is 45 to 50%. The  left ventricle has mildly decreased function. The left ventricle  demonstrates global hypokinesis. There is mild concentric left ventricular  hypertrophy. Left ventricular diastolic  parameters are consistent with Grade II diastolic dysfunction  (pseudonormalization).   2. Right ventricular systolic function is mildly reduced. The right  ventricular size is mildly enlarged. There is mildly elevated pulmonary  artery systolic pressure. The estimated right ventricular systolic  pressure is [id number] mmHg.   3. The mitral valve is grossly normal. Trivial mitral valve  regurgitation. No evidence of mitral stenosis.   4. The aortic valve is tricuspid. Aortic valve regurgitation is not  visualized. Aortic valve sclerosis is present, with no evidence of aortic  valve stenosis.   5. The inferior vena cava is dilated in size with <50% respiratory  variability, suggesting right atrial pressure of 15 mmHg.   FINDINGS   Left Ventricle: Left ventricular ejection fraction, by estimation, is 45  to 50%. The left  ventricle has mildly decreased function. The left  ventricle demonstrates global hypokinesis. The left ventricular internal  cavity size was normal in size. There is   mild concentric left ventricular hypertrophy. Left ventricular diastolic  parameters are consistent with Grade II diastolic dysfunction  (pseudonormalization).   Right Ventricle: The right ventricular size is mildly enlarged. No  increase in right ventricular wall thickness. Right ventricular systolic  function is mildly reduced. There is mildly elevated pulmonary artery  systolic pressure. The tricuspid regurgitant   velocity is 2.55 m/s, and with an assumed right atrial pressure of 15  mmHg, the estimated right ventricular systolic pressure is [id number] mmHg.   Left Atrium: Left atrial size was normal in size.   Right Atrium: Right atrial size was normal in size.   Pericardium: There is no evidence of pericardial effusion.   Mitral Valve: The mitral valve is grossly normal. Trivial mitral valve  regurgitation. No evidence of mitral valve stenosis.   Tricuspid Valve: The tricuspid valve is grossly normal. Tricuspid valve  regurgitation is trivial. No evidence of tricuspid stenosis.   Aortic Valve: The aortic valve is tricuspid. Aortic valve regurgitation is  not visualized. Aortic valve sclerosis is present, with no evidence of  aortic valve stenosis.   Pulmonic Valve: The pulmonic valve was grossly normal. Pulmonic valve  regurgitation is trivial. No evidence of pulmonic stenosis.   Aorta: The aortic root and ascending aorta are structurally normal, with  no evidence of dilitation.   Venous: The inferior vena cava is dilated in size with less than 50%  respiratory variability, suggesting right atrial pressure of 15 mmHg.   IAS/Shunts: The atrial septum is grossly normal.   Patient  Profile     72 y.o. female with a hx of history of ESRD on dialysis, paroxysmal A-fib, PE/DVT, CAD s/p PCI, lower extremity  necrotizing infctions s/p amputations, combined systolic and diastolic heart failure, dementia, bipolar, COPD, T2DM, HTN, and HLD  who is being seen 07/29/2022 for the evaluation of on going chest pressure x 1 week at the request of Dr. Saverio Danker.     Assessment & Plan    Chest pain CAD status post prior PCI --Cardiac catheterization 12/2017 with moderate LAD and diagonal disease, heavily calcified ostial circumflex of 65% which was treated medically as it was felt to not optimal for treatment with percutaneous intervention.  She had a minimal troponin elevation which was flat but continues to have chest pain during admission.  Reviewed with MD, will plan for cardiac catheterization for definitive evaluation today. --Continue IV heparin, add aspirin, increase atorvastatin to 80 mg daily, Imdur 30 mg daily, metoprolol XL 25 mg daily  Shared Decision Making/Informed Consent The risks [stroke (1 in 1000), death (1 in 1000), kidney failure [usually temporary] (1 in 500), bleeding (1 in 200), allergic reaction [possibly serious] (1 in 200)], benefits (diagnostic support and management of coronary artery disease) and alternatives of a cardiac catheterization were discussed in detail with Ms. Gillogly and she is willing to proceed.  HFmrEF -- Echo this admission showed LVEF of 45 to 50%, global hypokinesis, grade 2 diastolic dysfunction, mildly enlarged and reduced RV with mildly elevated pulmonary artery pressure, trivial MR. -- GDMT: Continue Toprol XL 25 mg daily.  Unable to add additional therapy given her renal disease  Paroxysmal atrial fibrillation Hx of PE/DVT --Remains in sinus rhythm, Eliquis has been held, continue IV heparin with plans for cardiac catheterization  ESRD on HD -- Underwent HD yesterday, management per nephrology  Hyperlipidemia -- increase atorvastatin to 80 mg daily  PVD S/p 1st and 2nd toe amputation -- continue statin   For questions or updates, please contact [place] Please consult [email or web address] for contact info under   Signed, Reino Bellis, NP  07/31/2022, 8:31 AM     Patient seen and examined. Agree with assessment and plan.  Has continued to have mild chest discomfort.  Per discussion with her yesterday, we will plan definitive repeat cardiac catheterization today.  Her last dose of Eliquis was 2 days ago.  Reino Bellis, NP did call the patient's daughter.  Apparently in February 2023, the patient had a PEA arrest and had undergone repeat catheterization at the [facility].   Troy Sine, MD, [facility] 07/31/2022 11:31 AM

## 2022-07-31 NOTE — Progress Notes (Signed)
[facility] Progress Note   Subjective: Seen in room. NPO for cardiac cath. Says she hungry and "a little scared". Emotional support to pt. HD tomorrow on schedule.    Objective Vitals:   07/31/22 0523 07/31/22 0558 07/31/22 0608 07/31/22 0727  BP: 135/64 119/86 139/76 115/63  Pulse: 63 79 77 62  Resp: 19   19  Temp: 97.7 F (36.5 C)   97.6 F (36.4 C)  TempSrc: Oral   Oral  SpO2: 94% 96% 96% 97%  Weight:      Height:       Physical Exam General: Pleasant elderly female in NAD Heart:S1,S2 No M/G/R. SR on monitor. Rate 70s Lungs: CTAB Abdomen: NABS. NT Extremities: R. TMA, multiple toe amps. No LE edema Dialysis Access: L AVF + T/B   Additional Objective Labs: Basic Metabolic Panel: Recent Labs  Lab 07/29/22 0443 07/29/22 1213 07/30/22 0559 07/30/22 1845  NA 136  --  137 135  K 3.7  --  3.6 3.8  CL 96*  --  96* 93*  CO2 25  --  22 28  GLUCOSE 171*  --  48* 209*  BUN 25*  --  40* 17  CREATININE 4.06*  --  5.54* 3.14*  CALCIUM 9.4  --  9.3 9.4  PHOS  --  4.9* 6.0*  --    Liver Function Tests: Recent Labs  Lab 07/29/22 0443 07/30/22 0559  AST 31  --   ALT 19  --   ALKPHOS 210*  --   BILITOT 0.7  --   PROT 7.0  --   ALBUMIN 3.1* 3.0*   Recent Labs  Lab 07/29/22 0443  LIPASE 52*   CBC: Recent Labs  Lab 07/29/22 0443 07/30/22 0559 07/31/22 0944  WBC 6.0 4.3 4.5  NEUTROABS 4.0  --   --   HGB 11.9* 11.6* 12.6  HCT 37.5 37.5 40.2  MCV 94.9 94.2 94.8  PLT 184 171 194   Blood Culture    Component Value Date/Time   SDES BLOOD SITE NOT SPECIFIED 07/27/2020 0948   SPECREQUEST  07/27/2020 0948    BOTTLES DRAWN AEROBIC AND ANAEROBIC Blood Culture adequate volume   CULT  07/27/2020 0948    NO GROWTH 5 DAYS Performed at [facility], [place] [address] [place], [place] [address]    REPTSTATUS 08/01/2020 FINAL 07/27/2020 0948    Cardiac Enzymes: No results for input(s): "CKTOTAL", "CKMB", "CKMBINDEX", "TROPONINI" in the last  168 hours. CBG: Recent Labs  Lab 07/30/22 0709 07/30/22 1413 07/30/22 1643 07/30/22 2023 07/31/22 0801  GLUCAP 88 148* 194* 300* 118*   Iron Studies: No results for input(s): "IRON", "TIBC", "TRANSFERRIN", "FERRITIN" in the last 72 hours. '@lablastinr3'$ @ Studies/Results: ECHOCARDIOGRAM COMPLETE  Result Date: 07/29/2022    ECHOCARDIOGRAM REPORT   Patient Name:   Robin [name] Date of Exam: 07/29/2022 Medical Rec #:  [id number]    Height:       64.0 in Accession #:    [id number]   Weight:       138.9 lb Date of Birth:  19-Sep-1950    BSA:          1.676 m Patient Age:    27 years     BP:           128/67 mmHg Patient Gender: F            HR:           71 bpm. Exam Location:  Inpatient  Procedure: 2D Echo, Cardiac Doppler and Color Doppler Indications:    Chest Pain R07.9                 Elevated Troponin  History:        Patient has prior history of Echocardiogram examinations, most                 recent 04/15/2019. CHF, Arrythmias:Tachycardia,                 Signs/Symptoms:Murmur and Chest Pain; Risk Factors:Dyslipidemia,                 Sleep Apnea, Hypertension and Current Smoker.  Sonographer:    Greer Pickerel Referring Phys: [id number] ELIZABETH REES  Sonographer Comments: Image acquisition challenging due to respiratory motion. IMPRESSIONS  1. Left ventricular ejection fraction, by estimation, is 45 to 50%. The left ventricle has mildly decreased function. The left ventricle demonstrates global hypokinesis. There is mild concentric left ventricular hypertrophy. Left ventricular diastolic parameters are consistent with Grade II diastolic dysfunction (pseudonormalization).  2. Right ventricular systolic function is mildly reduced. The right ventricular size is mildly enlarged. There is mildly elevated pulmonary artery systolic pressure. The estimated right ventricular systolic pressure is [id number] mmHg.  3. The mitral valve is grossly normal. Trivial mitral valve regurgitation. No evidence of mitral stenosis.  4.  The aortic valve is tricuspid. Aortic valve regurgitation is not visualized. Aortic valve sclerosis is present, with no evidence of aortic valve stenosis.  5. The inferior vena cava is dilated in size with <50% respiratory variability, suggesting right atrial pressure of 15 mmHg. FINDINGS  Left Ventricle: Left ventricular ejection fraction, by estimation, is 45 to 50%. The left ventricle has mildly decreased function. The left ventricle demonstrates global hypokinesis. The left ventricular internal cavity size was normal in size. There is  mild concentric left ventricular hypertrophy. Left ventricular diastolic parameters are consistent with Grade II diastolic dysfunction (pseudonormalization). Right Ventricle: The right ventricular size is mildly enlarged. No increase in right ventricular wall thickness. Right ventricular systolic function is mildly reduced. There is mildly elevated pulmonary artery systolic pressure. The tricuspid regurgitant  velocity is 2.55 m/s, and with an assumed right atrial pressure of 15 mmHg, the estimated right ventricular systolic pressure is [id number] mmHg. Left Atrium: Left atrial size was normal in size. Right Atrium: Right atrial size was normal in size. Pericardium: There is no evidence of pericardial effusion. Mitral Valve: The mitral valve is grossly normal. Trivial mitral valve regurgitation. No evidence of mitral valve stenosis. Tricuspid Valve: The tricuspid valve is grossly normal. Tricuspid valve regurgitation is trivial. No evidence of tricuspid stenosis. Aortic Valve: The aortic valve is tricuspid. Aortic valve regurgitation is not visualized. Aortic valve sclerosis is present, with no evidence of aortic valve stenosis. Pulmonic Valve: The pulmonic valve was grossly normal. Pulmonic valve regurgitation is trivial. No evidence of pulmonic stenosis. Aorta: The aortic root and ascending aorta are structurally normal, with no evidence of dilitation. Venous: The inferior vena cava  is dilated in size with less than 50% respiratory variability, suggesting right atrial pressure of 15 mmHg. IAS/Shunts: The atrial septum is grossly normal.  LEFT VENTRICLE PLAX 2D LVIDd:         3.90 cm      Diastology LVIDs:         3.40 cm      LV e' medial:    3.65 cm/s LV PW:         1.20  cm      LV E/e' medial:  27.7 LV IVS:        1.30 cm      LV e' lateral:   5.27 cm/s LVOT diam:     1.70 cm      LV E/e' lateral: 19.2 LV SV:         39 LV SV Index:   23 LVOT Area:     2.27 cm  LV Volumes (MOD) LV vol d, MOD A2C: 123.0 ml LV vol d, MOD A4C: 95.1 ml LV vol s, MOD A2C: 77.7 ml LV vol s, MOD A4C: 67.8 ml LV SV MOD A2C:     45.3 ml LV SV MOD A4C:     95.1 ml LV SV MOD BP:      34.7 ml RIGHT VENTRICLE RV S prime:     7.05 cm/s TAPSE (M-mode): 1.3 cm LEFT ATRIUM             Index        RIGHT ATRIUM           Index LA diam:        3.00 cm 1.79 cm/m   RA Area:     27.40 cm LA Vol (A2C):   43.1 ml 25.72 ml/m  RA Volume:   91.10 ml  54.37 ml/m LA Vol (A4C):   47.1 ml 28.11 ml/m LA Biplane Vol: 46.2 ml 27.57 ml/m  AORTIC VALVE             PULMONIC VALVE LVOT Vmax:   80.90 cm/s  PR End Diast Vel: 5.34 msec LVOT Vmean:  49.300 cm/s LVOT VTI:    0.171 m  AORTA Ao Root diam: 3.10 cm Ao Asc diam:  3.40 cm MITRAL VALVE                TRICUSPID VALVE MV Area (PHT): 3.65 cm     TR Peak grad:   26.0 mmHg MV Decel Time: 208 msec     TR Vmax:        255.00 cm/s MV E velocity: 101.00 cm/s MV A velocity: 87.90 cm/s   SHUNTS MV E/A ratio:  1.15         Systemic VTI:  0.17 m                             Systemic Diam: 1.70 cm Eleonore Chiquito MD Electronically signed by Eleonore Chiquito MD Signature Date/Time: 07/29/2022/1:28:42 PM    Final    Medications:  sodium chloride     sodium chloride 10 mL/hr at 07/31/22 0938   ferric gluconate (FERRLECIT) IVPB Stopped (07/30/22 1405)   heparin 850 Units/hr (07/31/22 0600)    amLODipine  2.5 mg Oral Daily   ARIPiprazole  12 mg Oral QHS   aspirin  81 mg Oral Pre-Cath   atorvastatin   80 mg Oral QHS   Chlorhexidine Gluconate Cloth  6 each Topical Q0600   clopidogrel  75 mg Oral Daily   ferric citrate  420 mg Oral BID WC   gabapentin  100 mg Oral BID   insulin aspart  0-6 Units Subcutaneous TID WC   insulin detemir  10 Units Subcutaneous QHS   isosorbide mononitrate  30 mg Oral Q breakfast   lidocaine  1 patch Transdermal Q24H   metoprolol succinate  25 mg Oral Daily   pantoprazole  40 mg Oral BID AC   sodium chloride  flush  3 mL Intravenous Q12H   umeclidinium bromide  2 puff Inhalation q morning      OP HD: [facility]  3.5h  400/1.5  59kg  2/2 bath  AVF  Hep none  - last HD 3/4, post wt 59.2kg - venofer '100mg'$  tiw thru 3/18 - hectorol 1 mcg IV tiw - korsuva 0.6 IV tiw - mircera 50 mcg IV q 2 wks, last 2/26, due 3/11     Assessment/ Plan: NSTEMI - IV heparin, seen by cardiology, per cards/ pmd. Going for cardiac cath today. HD tomorrow  ESRD - on HD MWF. Next HD 08/01/2022.  HTN/ volume - BP's are reasonable. Wt up on admission. Net UF 3 liters with HD 07/30/2022. Post weights are incorrect. Spoke with RN who will get standing wt. Continue to optimize volume with dialysis.  Anemia esrd - Hb 11s, no esa needs.  MBD ckd - CCa and phos are in range. Cont IV vdra.  Atrial fib - per pmd PAD - hx foot/ toe amps H/o DVT/ pulm embolism Hx dementia/ bipolar/ schizoaffective d/o   Taim Wurm H. Enis Leatherwood NP-C 07/31/2022, 10:39 AM  [employer] [phone]

## 2022-07-31 NOTE — Progress Notes (Signed)
45 IMTS paged by RN about acute chest pressure. 7/10 that radiates to mid scapula. Per patient, pain is similar to how she presented to ED. Nitro SL given.   Vitals showed BP 119/86, HR 80, RR 19, SpO2 96% on room air, afebrile. Patient awake and alert. Not in acute distress. States chest pressure is now 5/10. Normal work of breathing on room air.   Repeat EKG did not show ST elevations, ST depression in lateral leads which is seen on previous EKG. Prolonged QT 521, also noted on prior EKG.   Chest pain in setting of recent NSTEMI --give second nitro SL --repeat serial troponin --notify day team and cardiology  --NPO now in case of cardiac cath

## 2022-07-31 NOTE — Progress Notes (Signed)
Awake. Patient asked something to eat. Provided meatloaf with mashed potato.

## 2022-07-31 NOTE — Progress Notes (Signed)
OT Cancellation Note  Patient Details Name: KAIYLA FRANCE MRN: [id number] DOB: 31-Jan-1951   Cancelled Treatment:    Reason Eval/Treat Not Completed: Other (comment) Reached out to RN who advised pt has cardiac cath scheduled today at 12 and to hold therapy attempts until after procedure. Will follow up as schedule permits.  Layla Maw 07/31/2022, 11:40 AM

## 2022-07-31 NOTE — Progress Notes (Signed)
HD#1 SUBJECTIVE:  Patient Summary: Robin [name] is a 72 y.o. 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   Overnight Events: Overnight team was paged by RN for patient having acute chest pressure that patient rated 7/10.  Pain radiated to mid scapula and was similar to pain upon presentation to ED. Repeat EKG did not show ST elevations, ST depression in lateral leads which is seen on previous EKG. Prolonged QTc 521, also noted on prior EKG. Sublingual nitroglycerin given with improvement of her pain to 5/10. Was seen by cardiology this morning and planning for LHC today.     OBJECTIVE:  Vital Signs: Vitals:   07/31/22 0558 07/31/22 0608 07/31/22 0727 07/31/22 1205  BP: 119/86 139/76 115/63   Pulse: 79 77 62   Resp:   19   Temp:   97.6 F (36.4 C)   TempSrc:   Oral   SpO2: 96% 96% 97% 97%  Weight:      Height:       Supplemental O2: Room Air SpO2: 97 % O2 Flow Rate (L/min): 2 L/min  Filed Weights   07/29/22 0422 07/30/22 0827 07/30/22 1225  Weight: 63 kg 87.1 kg 84.1 kg     Intake/Output Summary (Last 24 hours) at 07/31/2022 1305 Last data filed at 07/31/2022 0600 Gross per 24 hour  Intake 411.27 ml  Output 0 ml  Net 411.27 ml   Net IO Since Admission: -3,090.65 mL [07/31/22 1305]  Physical Exam: Physical Exam Constitutional:      General: She is not in acute distress.    Appearance: She is not diaphoretic.  HENT:     Head: Normocephalic and atraumatic.     Mouth/Throat:     Mouth: Mucous membranes are moist.  Cardiovascular:     Rate and Rhythm: Regular rhythm.     Pulses: Normal pulses.          Radial pulses are 2+ on the right side and 2+ on the left side.     Comments: S3 vs S4 noted.  Pulmonary:     Effort: Pulmonary effort is  normal. No respiratory distress.     Breath sounds: Normal breath sounds.  Abdominal:     General: Bowel sounds are normal. There is no distension.     Palpations: Abdomen is soft.     Tenderness: There is no abdominal tenderness.  Musculoskeletal:        General: Normal range of motion.     Comments: Tenderness to palpation of the midline lumbar region, no deformity noted, no paraspinal muscle tenderness  Skin:    General: Skin is warm and dry.  Neurological:     General: No focal deficit present.     Mental Status: She is alert and oriented to person, place, and time.  Psychiatric:        Mood and Affect: Mood normal.        Behavior: Behavior normal.     Patient Lines/Drains/Airways Status     Active Line/Drains/Airways     Name Placement date Placement time Site Days   Peripheral IV 07/29/22 22 G Posterior;Right Hand 07/29/22  0446  Hand  2   Peripheral IV 07/31/22 20 G 2.5" Right Antecubital 07/31/22  1001  Antecubital  less than 1   Fistula /  Graft Left Upper arm Arteriovenous fistula 09/30/14  --  Upper arm  2861   Fistula / Graft Left Upper arm Arteriovenous fistula --  --  Upper arm  --   Fistula / Graft Left Upper arm --  --  Upper arm  --   Sheath 07/31/22 Right Femoral;Arterial 07/31/22  1304  Femoral;Arterial  less than 1   External Urinary Catheter 07/29/22  1930  --  2   Incision (Closed) 03/16/15 Leg Left 03/16/15  1346  -- 2694   Incision (Closed) 08/06/16 Foot Right 08/06/16  0915  -- 2185   Incision (Closed) 09/09/16 Foot Right 09/09/16  0924  -- 2151            Pertinent Labs:    Latest Ref Rng & Units 07/31/2022    9:44 AM 07/30/2022    5:59 AM 07/29/2022    4:43 AM  CBC  WBC 4.0 - 10.5 K/uL 4.5  4.3  6.0   Hemoglobin 12.0 - 15.0 g/dL 12.6  11.6  11.9   Hematocrit 36.0 - 46.0 % 40.2  37.5  37.5   Platelets 150 - 400 K/uL 194  171  184        Latest Ref Rng & Units 07/30/2022    6:45 PM 07/30/2022    5:59 AM 07/29/2022    4:43 AM  CMP  Glucose 70 -  99 mg/dL 209  48  171   BUN 8 - 23 mg/dL 17  40  25   Creatinine 0.44 - 1.00 mg/dL 3.14  5.54  4.06   Sodium 135 - 145 mmol/L 135  137  136   Potassium 3.5 - 5.1 mmol/L 3.8  3.6  3.7   Chloride 98 - 111 mmol/L 93  96  96   CO2 22 - 32 mmol/L '28  22  25   '$ Calcium 8.9 - 10.3 mg/dL 9.4  9.3  9.4   Total Protein 6.5 - 8.1 g/dL   7.0   Total Bilirubin 0.3 - 1.2 mg/dL   0.7   Alkaline Phos 38 - 126 U/L   210   AST 15 - 41 U/L   31   ALT 0 - 44 U/L   19     Recent Labs    07/30/22 1643 07/30/22 2023 07/31/22 0801  GLUCAP 194* 300* 118*     Pertinent Imaging and Procedures: EKG 07/31/22 05:36: Normal sinus rhythm Left axis deviation Minimal voltage criteria for LVH, may be normal variant ( Cornell product ) Cannot rule out Inferior infarct , age undetermined Anterior infarct , age undetermined ST & T wave abnormality, consider lateral ischemia Prolonged Qtc 521  ASSESSMENT/PLAN:  Assessment: Principal Problem:   NSTEMI (non-ST elevated myocardial infarction) ([place]) Active Problems:   Chest pain   PAC (premature atrial contraction)   Hyperlipidemia with target LDL less than 70   Chest tightness   Robin [name] is a 72 y.o. 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 Currently on hospital day 2.  Plan: #NSTEMI Hx of CAD s/p PCI Had chest pain radiating to the back overnight, improved with nitroglycerin. Repeat EKG w/o ST elevations. Demonstrated ST depression in lateral leads similar to prior w/ prolonged Qtc. Repeat troponin elevated at 81 but decreased from prior. Given aspirin 81 mg 1x. Underwent  left heart cath with cardiology today. Plan to continue with medical therapy.     -Appreciate cardiology recommendations  -Continue metoprolol succinate '25mg'$   daily and imdur '30mg'$  daily  -Increase atorvastatin to 80 mg daily -Continue heparin GGT -Nitro PRN -Telemetry   #Hx of combined systolic and diastolic HF Echo here demonstrates EF 45-50% with global hypokinesis of the LV w/ G2DD. GDMT limited by ESRD. No signs of volume overload on exam.  -Continue metoprolol succinate '25mg'$  daily   #Hx of atrial fibrillation On eliquis at home. Remains in sinus rhythm.  -Continue heparin ggt for NSTEMI   #ESRD on HD (MWF) Plan for hemodialysis tomorrow per nephrology.  -Continue ferric citrate 520 mg BID   #Back pain Has tenderness to palpation of the midline lumbar spine without deformities noted. Suspect likely musculoskeletal in nature but could also be chest pain radiating to the back from the NSTEMI. Will continue lidocaine patch today and reassess patient for worsening chest or back pain. -Continue Lidocaine patch    #Feet pain Describes this pain as similar to her history of neuropathy in the setting of T2DM. Will continue her home gabapentin. -Continue gabapentin '100mg'$  BID   #Type 2 DM Most recent A1c in 04/2022 of 7.0%. Patient eating well.  -Continue SSI -Continue levemir to 10 U QHS -Trend CBGs   #PAD Has hx of bilateral LE arterial disease. S/p LLE SFA stent in 2023. Hospitalized last year for infectious/ischemic disease requiring right TMA and left 1st toe amputation. -Continue atorvastatin 40 mg daily -Continue clopidogrel 75 mg daily   #Hx of pulmonary embolism and DVT No extremity pain, increased oxygen requirements, or tachycardia. Low suspicion for DVT/PE at this time.  -Continue IV anticoagulation, transition back to home dosing once stable  #Schizoaffective & bipolar disorder #Dementia Follows w/ [facility] mental health as outpatient. Taking Abilify 12 mg daily.  -Continue Abilify 12 mg daily -Outpatient f/u w/ [facility] mental health   #OSA Does not wear CPAP at home.    #GERD -Continue home Protonix 40 mg BID    #HLD -Continue home atorvastatin 40 mg daily   #HTN Holding Lisinopril 2.5 mg for now. Will reassess BP following HD.  Best Practice: Diet: Renal diet IVF: Fluids: none VTE: heparin ggt, plavix Code: Full Therapy Recs:  pending , PT/OT missed patient today due to cath procedure Family Contact: daughter, to be notified. DISPO: Anticipated discharge in 2 days to Home pending  continued management of NSTEMI .   Signature: Marisa Cyphers, Medical Student   Please contact the on call pager after 5 pm and on weekends at [phone].  Attestation for Student Documentation:  I personally was present and performed or re-performed the history, physical exam and medical decision-making activities of this service and have verified that the service and findings are accurately documented in the student's note.  Starlyn Skeans, MD 07/31/2022, 3:09 PM

## 2022-07-31 NOTE — Progress Notes (Signed)
SITE AREA: right groin/femoral  SITE PRIOR TO REMOVAL:  LEVEL 0  PRESSURE APPLIED FOR: approximately 20 minutes  MANUAL: yes  PATIENT STATUS DURING PULL: stable   POST PULL SITE:  LEVEL 0  POST PULL INSTRUCTIONS GIVEN: yes, drsg x24 hrs, no sitting in water, hot tubs, pools, bath tubs x 1 week, no running, jumping, etc  POST PULL PULSES PRESENT: bilateral pedal pulses palpable at +1  DRESSING APPLIED: gauze with tegaderm  BEDREST BEGINS @ 1911  COMMENTS:

## 2022-07-31 NOTE — Interval H&P Note (Signed)
History and Physical Interval Note:  07/31/2022 10:25 AM  Robin [name]  has presented today for surgery, with the diagnosis of unstable angina.  The various methods of treatment have been discussed with the patient and family. After consideration of risks, benefits and other options for treatment, the patient has consented to  Procedure(s): LEFT HEART CATH AND CORONARY ANGIOGRAPHY (N/A) as a surgical intervention.  The patient's history has been reviewed, patient examined, no change in status, stable for surgery.  I have reviewed the patient's chart and labs.  Questions were answered to the patient's satisfaction.    Cath Lab Visit (complete for each Cath Lab visit)  Clinical Evaluation Leading to the Procedure:   ACS: Yes.    Non-ACS:    Anginal Classification: CCS IV  Anti-ischemic medical therapy: Maximal Therapy (2 or more classes of medications)  Non-Invasive Test Results: No non-invasive testing performed  Prior CABG: No previous CABG        Early Osmond

## 2022-07-31 NOTE — Progress Notes (Addendum)
Complained of chest pressure, 7/10, aching radiating to mid scapula. Same pain she had at home per patient. Nitro SL given. NO SOB. No diaphoresis. EKG done. Informed Markus Jarvis, MD with order made. Plan of care ongoing.   0552 = 5/10.  0602 - pain 3/10. Complained of slight SOB, RR 19 bpm, not in distress. SP02= 97%.  Hooked to oxygen via  at 2 lpm for comfort.   Seen on rounds by Dr. Markus Jarvis and Dr. Raymondo Band. Advised to keep the patient on NPO for now.   07/31/22 0523  Vitals  Temp 97.7 F (36.5 C)  Temp Source Oral  BP 135/64  MAP (mmHg) 85  BP Location Right Arm  BP Method Automatic  Patient Position (if appropriate) Lying  Pulse Rate 63  Pulse Rate Source Monitor  ECG Heart Rate 65  Resp 19  MEWS COLOR  MEWS Score Color Green  Oxygen Therapy  SpO2 94 %  O2 Device Room Air  MEWS Score  MEWS Temp 0  MEWS Systolic 0  MEWS Pulse 0  MEWS RR 0  MEWS LOC 0  MEWS Score 0

## 2022-07-31 NOTE — Progress Notes (Signed)
PT Cancellation Note  Patient Details Name: Robin [name] MRN: [id number] DOB: 07-Jun-1950   Cancelled Treatment:    Reason Eval/Treat Not Completed: Patient at procedure or test/unavailable (Reached out to RN who advised PT that pt has cardiac cath scheduled today at 12 and to hold until after procedure. Will try to see if time allows.)   Viann Shove 07/31/2022, 11:22 AM

## 2022-08-01 ENCOUNTER — Inpatient Hospital Stay (HOSPITAL_COMMUNITY): Payer: No Typology Code available for payment source

## 2022-08-01 ENCOUNTER — Encounter (HOSPITAL_COMMUNITY): Payer: Self-pay | Admitting: Internal Medicine

## 2022-08-01 DIAGNOSIS — R0789 Other chest pain: Secondary | ICD-10-CM | POA: Diagnosis not present

## 2022-08-01 DIAGNOSIS — I214 Non-ST elevation (NSTEMI) myocardial infarction: Secondary | ICD-10-CM | POA: Diagnosis not present

## 2022-08-01 DIAGNOSIS — I491 Atrial premature depolarization: Secondary | ICD-10-CM | POA: Diagnosis not present

## 2022-08-01 DIAGNOSIS — E785 Hyperlipidemia, unspecified: Secondary | ICD-10-CM | POA: Diagnosis not present

## 2022-08-01 DIAGNOSIS — Z992 Dependence on renal dialysis: Secondary | ICD-10-CM

## 2022-08-01 DIAGNOSIS — F17210 Nicotine dependence, cigarettes, uncomplicated: Secondary | ICD-10-CM | POA: Diagnosis not present

## 2022-08-01 LAB — CBC
HCT: 37.2 % (ref 36.0–46.0)
Hemoglobin: 11.8 g/dL — ABNORMAL LOW (ref 12.0–15.0)
MCH: 29.9 pg (ref 26.0–34.0)
MCHC: 31.7 g/dL (ref 30.0–36.0)
MCV: 94.2 fL (ref 80.0–100.0)
Platelets: 162 10*3/uL (ref 150–400)
RBC: 3.95 MIL/uL (ref 3.87–5.11)
RDW: 19.6 % — ABNORMAL HIGH (ref 11.5–15.5)
WBC: 4.9 10*3/uL (ref 4.0–10.5)
nRBC: 0 % (ref 0.0–0.2)

## 2022-08-01 LAB — RENAL FUNCTION PANEL
Albumin: 3.1 g/dL — ABNORMAL LOW (ref 3.5–5.0)
Anion gap: 16 — ABNORMAL HIGH (ref 5–15)
BUN: 39 mg/dL — ABNORMAL HIGH (ref 8–23)
CO2: 26 mmol/L (ref 22–32)
Calcium: 9.3 mg/dL (ref 8.9–10.3)
Chloride: 94 mmol/L — ABNORMAL LOW (ref 98–111)
Creatinine, Ser: 5.44 mg/dL — ABNORMAL HIGH (ref 0.44–1.00)
GFR, Estimated: 8 mL/min — ABNORMAL LOW (ref >60)
Glucose, Bld: 204 mg/dL — ABNORMAL HIGH (ref 70–99)
Phosphorus: 5.9 mg/dL — ABNORMAL HIGH (ref 2.5–4.6)
Potassium: 4.9 mmol/L (ref 3.5–5.1)
Sodium: 136 mmol/L (ref 135–145)

## 2022-08-01 LAB — HEPARIN LEVEL (UNFRACTIONATED): Heparin Unfractionated: 0.17 IU/mL — ABNORMAL LOW (ref 0.30–0.70)

## 2022-08-01 LAB — LIPID PANEL
Cholesterol: 138 mg/dL (ref 0–200)
HDL: 60 mg/dL (ref >40)
LDL Cholesterol: 40 mg/dL (ref 0–99)
Total CHOL/HDL Ratio: 2.3 RATIO
Triglycerides: 192 mg/dL — ABNORMAL HIGH (ref <150)
VLDL: 38 mg/dL (ref 0–40)

## 2022-08-01 LAB — GLUCOSE, CAPILLARY
Glucose-Capillary: 214 mg/dL — ABNORMAL HIGH (ref 70–99)
Glucose-Capillary: 195 mg/dL — ABNORMAL HIGH (ref 70–99)
Glucose-Capillary: 306 mg/dL — ABNORMAL HIGH (ref 70–99)
Glucose-Capillary: 243 mg/dL — ABNORMAL HIGH (ref 70–99)

## 2022-08-01 LAB — APTT: aPTT: 35 seconds (ref 24–36)

## 2022-08-01 MED ORDER — ACETAMINOPHEN 500 MG PO TABS: 1000.0000 mg | ORAL_TABLET | Freq: Three times a day (TID) | ORAL | Status: DC

## 2022-08-01 MED ORDER — AMLODIPINE BESYLATE 5 MG PO TABS
5.0000 mg | ORAL_TABLET | Freq: Every day | ORAL | Status: DC
  Administered 2022-08-01: 5 mg via ORAL
  Filled 2022-08-01: qty 1

## 2022-08-01 MED ORDER — LIDOCAINE HCL (PF) 1 % IJ SOLN
5.0000 mL | INTRAMUSCULAR | Status: DC | PRN
  Filled 2022-08-01: qty 5

## 2022-08-01 MED ORDER — ACETAMINOPHEN 500 MG PO TABS
1000.0000 mg | ORAL_TABLET | Freq: Four times a day (QID) | ORAL | Status: DC | PRN
  Administered 2022-08-01: 1000 mg via ORAL
  Filled 2022-08-01: qty 2

## 2022-08-01 MED ORDER — DICLOFENAC SODIUM 1 % EX GEL
4.0000 g | Freq: Four times a day (QID) | CUTANEOUS | Status: DC
  Administered 2022-08-01 – 2022-08-05 (×17): 4 g via TOPICAL
  Filled 2022-08-01: qty 100

## 2022-08-01 MED ORDER — ACETAMINOPHEN 500 MG PO TABS
1000.0000 mg | ORAL_TABLET | Freq: Four times a day (QID) | ORAL | Status: DC
  Administered 2022-08-01 – 2022-08-05 (×14): 1000 mg via ORAL
  Filled 2022-08-01 (×15): qty 2

## 2022-08-01 MED ORDER — LIDOCAINE-PRILOCAINE 2.5-2.5 % EX CREA
1.0000 | TOPICAL_CREAM | CUTANEOUS | Status: DC | PRN
  Filled 2022-08-01: qty 5

## 2022-08-01 MED ORDER — APIXABAN 2.5 MG PO TABS
2.5000 mg | ORAL_TABLET | Freq: Two times a day (BID) | ORAL | Status: DC
  Administered 2022-08-01 – 2022-08-05 (×9): 2.5 mg via ORAL
  Filled 2022-08-01 (×9): qty 1

## 2022-08-01 MED ORDER — NEPRO/CARBSTEADY PO LIQD
237.0000 mL | Freq: Two times a day (BID) | ORAL | Status: DC
  Administered 2022-08-03 – 2022-08-05 (×5): 237 mL via ORAL

## 2022-08-01 MED ORDER — PENTAFLUOROPROP-TETRAFLUOROETH EX AERO: 1.0000 | INHALATION_SPRAY | CUTANEOUS | Status: DC | PRN

## 2022-08-01 MED ORDER — RENA-VITE PO TABS
1.0000 | ORAL_TABLET | Freq: Every day | ORAL | Status: DC
  Administered 2022-08-01 – 2022-08-04 (×4): 1 via ORAL
  Filled 2022-08-01 (×4): qty 1

## 2022-08-01 NOTE — Progress Notes (Signed)
[facility] Progress Note   Subjective:    Seen and examined patient at bedside. C/o upper back, shoulder pain, and some nausea. S/p cardiac cath yesterday. Plan for HD today.  Objective Vitals:   07/31/22 1905 07/31/22 1910 07/31/22 1930 08/01/22 0429  BP: 139/84 (!) 149/73 (!) 156/92 (!) 163/84  Pulse: 66 73  67  Resp:   18 20  Temp:   (!) 97.5 F (36.4 C) 97.8 F (36.6 C)  TempSrc:   Oral Oral  SpO2: 96% 95% 100% 96%  Weight:      Height:       Physical Exam General: Pleasant elderly female in NAD Heart:S1,S2 No M/G/R. SR on monitor. Rate 70s Lungs: CTAB Abdomen: NABS. NT Extremities: R. TMA, multiple toe amps. No LE edema Dialysis Access: L AVF + T/B  Filed Weights   07/29/22 0422 07/30/22 0827 07/30/22 1225  Weight: 63 kg 87.1 kg 84.1 kg    Intake/Output Summary (Last 24 hours) at 08/01/2022 0730 Last data filed at 07/31/2022 2000 Gross per 24 hour  Intake 240 ml  Output --  Net 240 ml    Additional Objective Labs: Basic Metabolic Panel: Recent Labs  Lab 07/29/22 1213 07/30/22 0559 07/30/22 1845 08/01/22 0302  NA  --  137 135 136  K  --  3.6 3.8 4.9  CL  --  96* 93* 94*  CO2  --  '22 28 26  '$ GLUCOSE  --  48* 209* 204*  BUN  --  40* 17 39*  CREATININE  --  5.54* 3.14* 5.44*  CALCIUM  --  9.3 9.4 9.3  PHOS 4.9* 6.0*  --  5.9*   Liver Function Tests: Recent Labs  Lab 07/29/22 0443 07/30/22 0559 08/01/22 0302  AST 31  --   --   ALT 19  --   --   ALKPHOS 210*  --   --   BILITOT 0.7  --   --   PROT 7.0  --   --   ALBUMIN 3.1* 3.0* 3.1*   Recent Labs  Lab 07/29/22 0443  LIPASE 52*   CBC: Recent Labs  Lab 07/29/22 0443 07/30/22 0559 07/31/22 0944 08/01/22 0302  WBC 6.0 4.3 4.5 4.9  NEUTROABS 4.0  --   --   --   HGB 11.9* 11.6* 12.6 11.8*  HCT 37.5 37.5 40.2 37.2  MCV 94.9 94.2 94.8 94.2  PLT 184 171 194 162   Blood Culture    Component Value Date/Time   SDES BLOOD SITE NOT SPECIFIED 07/27/2020 0948   SPECREQUEST   07/27/2020 0948    BOTTLES DRAWN AEROBIC AND ANAEROBIC Blood Culture adequate volume   CULT  07/27/2020 0948    NO GROWTH 5 DAYS Performed at [facility], [place] [address], [place], [place] [address]    REPTSTATUS 08/01/2020 FINAL 07/27/2020 0948    Cardiac Enzymes: No results for input(s): "CKTOTAL", "CKMB", "CKMBINDEX", "TROPONINI" in the last 168 hours. CBG: Recent Labs  Lab 07/30/22 1413 07/30/22 1643 07/30/22 2023 07/31/22 0801 07/31/22 2118  GLUCAP 148* 194* 300* 118* 110*   Iron Studies: No results for input(s): "IRON", "TIBC", "TRANSFERRIN", "FERRITIN" in the last 72 hours. Lab Results  Component Value Date   INR 1.3 (H) 07/25/2020   INR 1.4 (H) 01/08/2020   INR 1.6 (H) 09/16/2018   Studies/Results: CARDIAC CATHETERIZATION  Addendum Date: 07/31/2022     Ost LAD to Mid LAD lesion is 60% stenosed.   Ost Cx to Prox  Cx lesion is 65% stenosed.   Prox Cx to Mid Cx lesion is 20% stenosed.   Prox RCA lesion is 20% stenosed.   Ost Ramus to Ramus lesion is 20% stenosed.   Ost 1st Diag lesion is 50% stenosed.   Ost 2nd Diag lesion is 70% stenosed.   Mid RCA lesion is 60% stenosed.   Mid LAD lesion is 40% stenosed. 1.  Patent ramus stent with minimal in-stent restenosis. 2.  Moderate proximal to mid LAD lesion with an RFR of 0.95. 3.  Moderate ostial left circumflex lesion with an RFR of 1.0. 4.  Moderate mid right coronary artery lesion with an RFR of 0.92. 5.  LVEDP of 6 mmHg. Recommendation: Medical therapy.  The results were discussed with the patient's daughter.  Result Date: 07/31/2022   Ost LAD to Mid LAD lesion is 60% stenosed.   Ost Cx to Prox Cx lesion is 65% stenosed.   Prox Cx to Mid Cx lesion is 20% stenosed.   Prox RCA lesion is 20% stenosed.   Ost Ramus to Ramus lesion is 20% stenosed.   Ost 1st Diag lesion is 50% stenosed.   Ost 2nd Diag lesion is 70% stenosed.   Mid RCA lesion is 60% stenosed.   Mid LAD lesion is 40% stenosed. 1.  Patent ramus stent with minimal  in-stent restenosis. 2.  Moderate proximal to mid LAD lesion with an RFR of 0.95. 3.  Moderate ostial left circumflex lesion with an RFR of 1.0. 4.  Moderate mid right coronary artery lesion with an RFR of 0.92. 5.  LVEDP of 6 mmHg. Recommendation: Medical therapy.    Medications:  sodium chloride     ferric gluconate (FERRLECIT) IVPB Stopped (07/30/22 1405)    amLODipine  2.5 mg Oral Daily   ARIPiprazole  12 mg Oral QHS   atorvastatin  80 mg Oral QHS   clopidogrel  75 mg Oral Daily   ferric citrate  420 mg Oral BID WC   gabapentin  100 mg Oral BID   insulin aspart  0-6 Units Subcutaneous TID WC   insulin detemir  10 Units Subcutaneous QHS   isosorbide mononitrate  30 mg Oral Q breakfast   lidocaine  1 patch Transdermal Q24H   metoprolol succinate  25 mg Oral Daily   pantoprazole  40 mg Oral BID AC   sodium chloride flush  3 mL Intravenous Q12H   sodium chloride flush  3 mL Intravenous Q12H   umeclidinium bromide  2 puff Inhalation q morning    Dialysis Orders: [facility] M[facility]  3.5h  400/1.5  59kg  2/2 bath  AVF  Hep none  - last HD 3/4, post wt 59.2kg - venofer '100mg'$  tiw thru 3/18 - hectorol 1 mcg IV tiw - korsuva 0.6 IV tiw - mircera 50 mcg IV q 2 wks, last 2/26, due 3/11  Assessment/Plan: NSTEMI - IV heparin, seen by cardiology, per cards/ pmd. S/p cardiac cath yesterday-awaiting results.  ESRD - on HD MWF. Next HD today.  HTN/ volume - BP's are reasonable. Wt up on admission. Net UF 3 liters with HD 07/30/2022. Post weights are incorrect. Spoke with RN who will get standing wt. Continue to optimize volume with dialysis.  Anemia esrd - Hb 11s, no esa needs.  MBD ckd - CCa in range and Phos mildly elevated. Cont IV vdra.  Atrial fib - per pmd PAD - hx foot/ toe amps H/o DVT/ pulm embolism Hx dementia/ bipolar/ schizoaffective d/o  Robin Poet, NP [facility] 08/01/2022,7:30 AM  LOS: 2 days

## 2022-08-01 NOTE — Progress Notes (Signed)
PT Cancellation Note  Patient Details Name: Robin [name] MRN: [id number] DOB: 1950/08/02   Cancelled Treatment:    Reason Eval/Treat Not Completed:  (Pt still in HD.  Will check back as able.)   Alvira Philips 08/01/2022, 1:19 PM

## 2022-08-01 NOTE — Procedures (Signed)
HD Note:  Some information was entered later than the data was gathered due to patient care needs. The stated time with the data is accurate.  Received patient in bed to unit.  Alert and oriented x4 Informed consent signed and in chart.   [place] duration:3.5  Patient tolerated well.  Transported back to the room  Alert, without acute distress.  Hand-off given to patient's nurse.   Access used: left upper arm fistual Access issues: The accessing required three sticks.  The first stick went into a blockage.  Per patient, there are many blockages and this has happened several times. No further issues.  Spoke with patient nurse, she will administer the iron rather than extend the treatment.  Total UF removed: [facility]

## 2022-08-01 NOTE — Progress Notes (Signed)
OT Cancellation Note  Patient Details Name: LYLLIANA NUNEZ MRN: [id number] DOB: 1951-02-04   Cancelled Treatment:    Reason Eval/Treat Not Completed: Patient at procedure or test/ unavailable.  HD, will check back as schedule allows.    Javeion Cannedy D Wilkes Potvin 08/01/2022, 11:03 AM 08/01/2022  RP, OTR/L  Acute Rehabilitation Services  Office:  [phone]

## 2022-08-01 NOTE — Progress Notes (Signed)
Initial Nutrition Assessment  DOCUMENTATION CODES:   Not applicable  INTERVENTION:  Provide Nepro Shake po BID, each supplement provides 425 kcal and 19 grams protein.  Provide Rena-vite po QHS.  NUTRITION DIAGNOSIS:   Increased nutrient needs related to catabolic illness (ESRD on HD, COPD, HF) as evidenced by estimated needs.  GOAL:   Patient will meet greater than or equal to 90% of their needs  MONITOR:   PO intake, Supplement acceptance, Labs, Weight trends, I & O's  REASON FOR ASSESSMENT:   Malnutrition Screening Tool    ASSESSMENT:   72 year old female with PMHx of ESRD on HD, paroxymal A-fib, PE/DVT, CAD s/p PCA, lower extremity necrotizing infections s/p amputations, combined systolic and diastolic heart failure, dementia, bipolar, COPD, T2DM, HTN, HLD admitted with ongoing chest pressure x 1 week.  3/7: s/p cath that showed patent ramus stent with minimal ISR, moderate proximal to mid LAD lesions with RFR 0.95, moderate ostial left Cx lesion with RFR 1.0, moderate mid RCA with an RFR 0.92. Medical therapy was recommended.   RD made multiple attempts to see pt on 3/7 and 3/8. Pt in cath lab on 3/7 and in HD on 3/8 and then subsequently in diagnostic radiology. Per review of chart pt ate 80% of breakfast on 07/30/22. No other meal documentation available. Pt was made NPO on 3/7 for cath lab and then diet later advanced at 1934 to renal/carb modified with 1200 mL fluid restriction. Pt with increased nutrient needs in setting of chronic/catabolic illnesses. She would benefit from oral nutrition supplement and addition of Rena-vite.  Weights in chart vary significantly. Noted pt was 68-76 kg in 2022. In 2023 pt was documented to be 63 kg on two occurrences. Admission wt was documented to be 63 kg (unsure if truly measured), but then subsequent weights have been 83.2-87.1 kg. Pt was 83.2 kg today after HD. Per Nephrology note wt up on admission and post weights have been  incorrect. Per their note dry wt is 59 kg. Will continue to monitor wt trends.  Medications reviewed and include: ferric citrate 420 mg BID with meals, gabapentin, Novolog 0- 6 units TID, Levemir 10 unitrs daily, Imdur, metoprolol, pantoprazole, ferric gluconate 125 mg every M/W/F with HD  Labs reviewed: CBG 110-214, Chloride 94, BUN 39, Creatinine 5.44, Phosphorus 5.9  UOP: none documented 2500 mL removed at HD today  I/O: -4150.7 mL since admission  Unable to identify if pt meets criteria for malnutrition at this time. Recommend obtaining history and NFPE at follow-up to further investigate nutritional status.  NUTRITION - FOCUSED PHYSICAL EXAM:  Unable to complete as unable to meet with pt at bedside  Diet Order:   Diet Order             Diet renal/carb modified with fluid restriction Diet-HS Snack? Nothing; Fluid restriction: 1200 mL Fluid; Room service appropriate? Yes; Fluid consistency: Thin  Diet effective now                  EDUCATION NEEDS:   No education needs have been identified at this time  Skin:  Skin Assessment: Reviewed RN Assessment  Last BM:  08/01/22 - type 4  Height:   Ht Readings from Last 1 Encounters:  07/29/22 '5\' 4"'$  (1.626 m)   Weight:   Wt Readings from Last 1 Encounters:  08/01/22 83.2 kg   BMI:  Body mass index is 31.48 kg/m.  Estimated Nutritional Needs:   Kcal:  [id number]  Protein:  85-95 grams  Fluid:  UOP + 1 L  Yui Mulvaney Magda Paganini, MS, RD, LDN, CNSC Pager number available on Amion

## 2022-08-01 NOTE — Progress Notes (Signed)
HD#2 SUBJECTIVE:  Patient Summary: Robin [name] is a 72 y.o. 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    Overnight Events: Patient reports that she slept poorly because of her back pain. It remains at midline with scattered sore spots bilaterally. She still feels chest pressure "like something is sitting on me" and feels short of breath. She would like more pain medication to help with her back pain.    OBJECTIVE:  Vital Signs: Vitals:   08/01/22 1000 08/01/22 1030 08/01/22 1100 08/01/22 1130  BP: 118/70 134/71 (P) 117/60 102/72  Pulse: 77 74 (P) 83 (!) 52  Resp: 14 12 (P) 12 15  Temp:      TempSrc:      SpO2: 97% 98% (P) 98% 90%  Weight:      Height:       Supplemental O2: Room Air SpO2: 90 % O2 Flow Rate (L/min): 2 L/min  Filed Weights   07/29/22 0422 07/30/22 0827 07/30/22 1225  Weight: 63 kg 87.1 kg 84.1 kg     Intake/Output Summary (Last 24 hours) at 08/01/2022 1143 Last data filed at 07/31/2022 2000 Gross per 24 hour  Intake 240 ml  Output --  Net 240 ml   Net IO Since Admission: -1,650.65 mL [08/01/22 1143]  Physical Exam: Physical Exam Constitutional:      General: She is not in acute distress. HENT:     Head: Normocephalic and atraumatic.     Mouth/Throat:     Mouth: Mucous membranes are moist.  Cardiovascular:     Rate and Rhythm: Normal rate and regular rhythm.     Pulses:          Radial pulses are 2+ on the right side and 2+ on the left side.     Comments: S3 vs S4 heard Pulmonary:     Effort: Pulmonary effort is normal. No respiratory distress.     Breath sounds: Normal breath sounds.  Abdominal:     General: Bowel sounds are normal. There is no distension.     Palpations: Abdomen is soft.  Musculoskeletal:         General: No signs of injury. Normal range of motion.     Comments: Tenderness to palpation of the midline thoracic region, no deformity noted, mild tenderness to palpation of bilateral upper paraspinal regions  Skin:    General: Skin is warm and dry.  Neurological:     Mental Status: She is alert and oriented to person, place, and time.  Psychiatric:        Mood and Affect: Mood normal.        Behavior: Behavior normal.     Patient Lines/Drains/Airways Status     Active Line/Drains/Airways     Name Placement date Placement time Site Days   Peripheral IV 07/29/22 22 G Posterior;Right Hand 07/29/22  0446  Hand  3   Peripheral IV 07/31/22 20 G 2.5" Right Antecubital 07/31/22  1001  Antecubital  1   Fistula / Graft Left Upper arm Arteriovenous fistula 09/30/14  --  Upper arm  2862   Fistula / Graft Left Upper arm Arteriovenous fistula --  --  Upper arm  --   Fistula / Graft Left Upper arm --  --  Upper arm  --  Incision (Closed) 03/16/15 Leg Left 03/16/15  1346  -- 2695   Incision (Closed) 08/06/16 Foot Right 08/06/16  0915  -- 2186   Incision (Closed) 09/09/16 Foot Right 09/09/16  0924  -- 2152            Pertinent Labs:    Latest Ref Rng & Units 08/01/2022    3:02 AM 07/31/2022    9:44 AM 07/30/2022    5:59 AM  CBC  WBC 4.0 - 10.5 K/uL 4.9  4.5  4.3   Hemoglobin 12.0 - 15.0 g/dL 11.8  12.6  11.6   Hematocrit 36.0 - 46.0 % 37.2  40.2  37.5   Platelets 150 - 400 K/uL 162  194  171        Latest Ref Rng & Units 08/01/2022    3:02 AM 07/30/2022    6:45 PM 07/30/2022    5:59 AM  CMP  Glucose 70 - 99 mg/dL 204  209  48   BUN 8 - 23 mg/dL 39  17  40   Creatinine 0.44 - 1.00 mg/dL 5.44  3.14  5.54   Sodium 135 - 145 mmol/L 136  135  137   Potassium 3.5 - 5.1 mmol/L 4.9  3.8  3.6   Chloride 98 - 111 mmol/L 94  93  96   CO2 22 - 32 mmol/L '26  28  22   '$ Calcium 8.9 - 10.3 mg/dL 9.3  9.4  9.3     Recent Labs    07/31/22 0801 07/31/22 2118 08/01/22 0751  GLUCAP 118* 110* 214*      Pertinent Imaging: CARDIAC CATHETERIZATION  Addendum Date: 07/31/2022     Ost LAD to Mid LAD lesion is 60% stenosed.   Ost Cx to Prox Cx lesion is 65% stenosed.   Prox Cx to Mid Cx lesion is 20% stenosed.   Prox RCA lesion is 20% stenosed.   Ost Ramus to Ramus lesion is 20% stenosed.   Ost 1st Diag lesion is 50% stenosed.   Ost 2nd Diag lesion is 70% stenosed.   Mid RCA lesion is 60% stenosed.   Mid LAD lesion is 40% stenosed. 1.  Patent ramus stent with minimal in-stent restenosis. 2.  Moderate proximal to mid LAD lesion with an RFR of 0.95. 3.  Moderate ostial left circumflex lesion with an RFR of 1.0. 4.  Moderate mid right coronary artery lesion with an RFR of 0.92. 5.  LVEDP of 6 mmHg. Recommendation: Medical therapy.  The results were discussed with the patient's daughter.  Result Date: 07/31/2022   Ost LAD to Mid LAD lesion is 60% stenosed.   Ost Cx to Prox Cx lesion is 65% stenosed.   Prox Cx to Mid Cx lesion is 20% stenosed.   Prox RCA lesion is 20% stenosed.   Ost Ramus to Ramus lesion is 20% stenosed.   Ost 1st Diag lesion is 50% stenosed.   Ost 2nd Diag lesion is 70% stenosed.   Mid RCA lesion is 60% stenosed.   Mid LAD lesion is 40% stenosed. 1.  Patent ramus stent with minimal in-stent restenosis. 2.  Moderate proximal to mid LAD lesion with an RFR of 0.95. 3.  Moderate ostial left circumflex lesion with an RFR of 1.0. 4.  Moderate mid right coronary artery lesion with an RFR of 0.92. 5.  LVEDP of 6 mmHg. Recommendation: Medical therapy.    ASSESSMENT/PLAN:  Assessment: Principal Problem:   NSTEMI (non-ST elevated myocardial infarction) [facility]) Active Problems:  Chest pain   PAC (premature atrial contraction)   Hyperlipidemia with target LDL less than 70   Chest tightness   Robin [name] is a 72 y.o. 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 Currently on hospital day 3.   Plan: #NSTEMI #Hx of CAD s/p PCI Continues to have chest pressure and back pain overnight. Underwent left heart cath with cardiology yesterday and plan to continue with medical therapy.  Held heparin drip.  Will reach out to cardiology regarding plans for further intervention.   -Appreciate cardiology recommendations  -Continue clopidogrel '75mg'$  daily -Continue metoprolol succinate '25mg'$  daily -Continue imdur '30mg'$  daily  -Continue atorvastatin to 80 mg daily -Nitro PRN -Telemetry   #Hx of combined systolic and diastolic HF #Hx of atrial fibrillation Echo here demonstrates EF 45-50% with global hypokinesis of the LV w/ G2DD. GDMT limited by ESRD. No signs of volume overload on exam. On eliquis at home. Remains in sinus rhythm. -Continue metoprolol succinate '25mg'$  daily -Continue low dose eliquis   #ESRD on HD (MWF) Plan for hemodialysis today per nephrology.  -Continue ferric citrate 520 mg BID  #Type 2 DM Most recent A1c in 04/2022 of 7.0%. Patient eating well.  -Continue SSI -Continue levemir to 10 U QHS -Trend CBGs   #PAD Has hx of bilateral LE arterial disease. S/p LLE SFA stent in 2023. Hospitalized last year for infectious/ischemic disease requiring right TMA and left 1st toe amputation. -Continue atorvastatin 40 mg daily -Continue clopidogrel 75 mg daily   #Hx of pulmonary embolism and DVT No extremity pain, increased oxygen requirements, or tachycardia. Low suspicion for DVT/PE at this time.  -Continue plavix and eliquis  #Back pain Has tenderness to palpation of the midline thoracic spine without deformities noted. Suspect likely musculoskeletal given reproducible. XRAY of thoracic spine negative. Will schedule tylenol and continue to reassess her symptoms.  -Continue Lidocaine patch  -Scheduled Tylenol   #Feet pain Describes this pain as similar  to her history of neuropathy in the setting of T2DM. Will continue her home gabapentin. -Continue gabapentin '100mg'$  BID   #Schizoaffective & bipolar disorder #Dementia Follows w/ [facility] mental health as outpatient. Taking Abilify 12 mg daily.  -Continue Abilify 12 mg daily -Outpatient f/u w/ [facility] mental health   #OSA Does not wear CPAP at home.    #GERD -Continue home Protonix 40 mg BID   #HLD -Continue home atorvastatin 40 mg daily   #HTN Holding Lisinopril 2.5 mg for now. Will reassess BP following HD.   Best Practice: Diet: Renal diet IVF: Fluids: none VTE: heparin ggt, plavix Code: Full Therapy Recs:  pending , PT/OT missed patient today due to hemodialysis Family Contact: daughter, to be notified. DISPO: Anticipated discharge in 2 days to Home pending  continued management of NSTEMI .    Signature: Marisa Cyphers, Medical Student   Please contact the on call pager after 5 pm and on weekends at [phone].  Attestation for Student Documentation:  I personally was present and performed or re-performed the history, physical exam and medical decision-making activities of this service and have verified that the service and findings are accurately documented in the student's note.  Starlyn Skeans, MD 08/01/2022, 5:39 PM

## 2022-08-01 NOTE — Progress Notes (Signed)
PT Cancellation Note  Patient Details Name: Robin [name] MRN: [id number] DOB: 07/03/1950   Cancelled Treatment:    Reason Eval/Treat Not Completed: Patient at procedure or test/unavailable (Pt in HD.  Will check back at later date.)   Alvira Philips 08/01/2022, 11:38 AM Ether Wolters M,PT Acute Rehab Services [phone]

## 2022-08-01 NOTE — Progress Notes (Addendum)
Rounding Note    [name] Date of Encounter: 08/01/2022  [place] Cardiologist: Shelva Majestic, MD   Subjective   Patient reports MSK chest and back pain, she has a lidocaine patch on. Cath site stable.   Inpatient Medications    Scheduled Meds:  amLODipine  2.5 mg Oral Daily   ARIPiprazole  12 mg Oral QHS   atorvastatin  80 mg Oral QHS   clopidogrel  75 mg Oral Daily   ferric citrate  420 mg Oral BID WC   gabapentin  100 mg Oral BID   insulin aspart  0-6 Units Subcutaneous TID WC   insulin detemir  10 Units Subcutaneous QHS   isosorbide mononitrate  30 mg Oral Q breakfast   lidocaine  1 patch Transdermal Q24H   metoprolol succinate  25 mg Oral Daily   pantoprazole  40 mg Oral BID AC   sodium chloride flush  3 mL Intravenous Q12H   sodium chloride flush  3 mL Intravenous Q12H   umeclidinium bromide  2 puff Inhalation q morning   Continuous Infusions:  sodium chloride     ferric gluconate (FERRLECIT) IVPB Stopped (07/30/22 1405)   PRN Meds: sodium chloride, acetaminophen, acetaminophen, albuterol, nitroGLYCERIN, sodium chloride flush   Vital Signs    Vitals:   07/31/22 1905 07/31/22 1910 07/31/22 1930 08/01/22 0429  BP: 139/84 (!) 149/73 (!) 156/92 (!) 163/84  Pulse: 66 73  67  Resp:   18 20  Temp:   (!) 97.5 F (36.4 C) 97.8 F (36.6 C)  TempSrc:   Oral Oral  SpO2: 96% 95% 100% 96%  Weight:      Height:        Intake/Output Summary (Last 24 hours) at 08/01/2022 0814 Last data filed at 07/31/2022 2000 Gross per 24 hour  Intake 240 ml  Output --  Net 240 ml      07/30/2022   12:25 PM 07/30/2022    8:27 AM 07/29/2022    4:22 AM  Last 3 Weights  Weight (lbs) 185 lb 6.5 oz 192 lb 0.3 oz 138 lb 14.2 oz  Weight (kg) 84.1 kg 87.1 kg 63 kg      Telemetry    NSR HR 70-80, PVCs, 5 beats NSVT - Personally Reviewed  ECG    No new - Personally Reviewed  Physical Exam   GEN: No acute distress.   Neck: No JVD Cardiac: RRR, no  murmurs, rubs, or gallops.  Respiratory: Clear to auscultation bilaterally. GI: Soft, nontender, non-distended  MS: No edema; No deformity. Neuro:  Nonfocal  Psych: Normal affect   Labs    High Sensitivity Troponin:   Recent Labs  Lab 07/29/22 0443 07/29/22 0658 07/31/22 0944  TROPONINIHS 200* 167* 81*     Chemistry Recent Labs  Lab 07/29/22 0443 07/29/22 1213 07/30/22 0559 07/30/22 1845 07/31/22 2039 08/01/22 0302  NA 136  --  137 135  --  136  K 3.7  --  3.6 3.8  --  4.9  CL 96*  --  96* 93*  --  94*  CO2 25  --  22 28  --  26  GLUCOSE 171*  --  48* 209*  --  204*  BUN 25*  --  40* 17  --  39*  CREATININE 4.06*  --  5.54* 3.14*  --  5.44*  CALCIUM 9.4  --  9.3 9.4  --  9.3  MG  --  2.3  --   --  2.2  --   PROT 7.0  --   --   --   --   --   ALBUMIN 3.1*  --  3.0*  --   --  3.1*  AST 31  --   --   --   --   --   ALT 19  --   --   --   --   --   ALKPHOS 210*  --   --   --   --   --   BILITOT 0.7  --   --   --   --   --   GFRNONAA 11*  --  8* 15*  --  8*  ANIONGAP 15  --  19* 14  --  16*    Lipids  Recent Labs  Lab 08/01/22 0302  CHOL 138  TRIG 192*  HDL 60  LDLCALC 40  CHOLHDL 2.3    Hematology Recent Labs  Lab 07/30/22 0559 07/31/22 0944 08/01/22 0302  WBC 4.3 4.5 4.9  RBC 3.98 4.24 3.95  HGB 11.6* 12.6 11.8*  HCT 37.5 40.2 37.2  MCV 94.2 94.8 94.2  MCH 29.1 29.7 29.9  MCHC 30.9 31.3 31.7  RDW 19.3* 19.7* 19.6*  PLT 171 194 162   Thyroid No results for input(s): "TSH", "FREET4" in the last 168 hours.  BNP Recent Labs  Lab 07/29/22 0443  BNP 1,147.2*    DDimer No results for input(s): "DDIMER" in the last 168 hours.   Radiology    CARDIAC CATHETERIZATION  Addendum Date: 07/31/2022     Ost LAD to Mid LAD lesion is 60% stenosed.   Ost Cx to Prox Cx lesion is 65% stenosed.   Prox Cx to Mid Cx lesion is 20% stenosed.   Prox RCA lesion is 20% stenosed.   Ost Ramus to Ramus lesion is 20% stenosed.   Ost 1st Diag lesion is 50% stenosed.   Ost  2nd Diag lesion is 70% stenosed.   Mid RCA lesion is 60% stenosed.   Mid LAD lesion is 40% stenosed. 1.  Patent ramus stent with minimal in-stent restenosis. 2.  Moderate proximal to mid LAD lesion with an RFR of 0.95. 3.  Moderate ostial left circumflex lesion with an RFR of 1.0. 4.  Moderate mid right coronary artery lesion with an RFR of 0.92. 5.  LVEDP of 6 mmHg. Recommendation: Medical therapy.  The results were discussed with the patient's daughter.  Result Date: 07/31/2022   Ost LAD to Mid LAD lesion is 60% stenosed.   Ost Cx to Prox Cx lesion is 65% stenosed.   Prox Cx to Mid Cx lesion is 20% stenosed.   Prox RCA lesion is 20% stenosed.   Ost Ramus to Ramus lesion is 20% stenosed.   Ost 1st Diag lesion is 50% stenosed.   Ost 2nd Diag lesion is 70% stenosed.   Mid RCA lesion is 60% stenosed.   Mid LAD lesion is 40% stenosed. 1.  Patent ramus stent with minimal in-stent restenosis. 2.  Moderate proximal to mid LAD lesion with an RFR of 0.95. 3.  Moderate ostial left circumflex lesion with an RFR of 1.0. 4.  Moderate mid right coronary artery lesion with an RFR of 0.92. 5.  LVEDP of 6 mmHg. Recommendation: Medical therapy.    Cardiac Studies   Cardiac Cath 07/31/22     Ost LAD to Mid LAD lesion is 60% stenosed.   Ost Cx to Prox Cx lesion is 65% stenosed.  Prox Cx to Mid Cx lesion is 20% stenosed.   Prox RCA lesion is 20% stenosed.   Ost Ramus to Ramus lesion is 20% stenosed.   Ost 1st Diag lesion is 50% stenosed.   Ost 2nd Diag lesion is 70% stenosed.   Mid RCA lesion is 60% stenosed.   Mid LAD lesion is 40% stenosed.   1.  Patent ramus stent with minimal in-stent restenosis. 2.  Moderate proximal to mid LAD lesion with an RFR of 0.95. 3.  Moderate ostial left circumflex lesion with an RFR of 1.0. 4.  Moderate mid right coronary artery lesion with an RFR of 0.92. 5.  LVEDP of 6 mmHg.   Recommendation: Medical therapy.  The results were discussed with the patient's daughter.  Coronary  Diagrams  Diagnostic Dominance: Co-dominant     ECHO: 07/29/2022  1. Left ventricular ejection fraction, by estimation, is 45 to 50%. The  left ventricle has mildly decreased function. The left ventricle  demonstrates global hypokinesis. There is mild concentric left ventricular  hypertrophy. Left ventricular diastolic  parameters are consistent with Grade II diastolic dysfunction  (pseudonormalization).   2. Right ventricular systolic function is mildly reduced. The right  ventricular size is mildly enlarged. There is mildly elevated pulmonary  artery systolic pressure. The estimated right ventricular systolic  pressure is [id number].   3. The mitral valve is grossly normal. Trivial mitral valve  regurgitation. No evidence of mitral stenosis.   4. The aortic valve is tricuspid. Aortic valve regurgitation is not  visualized. Aortic valve sclerosis is present, with no evidence of aortic  valve stenosis.   5. The inferior vena cava is dilated in size with <50% respiratory  variability, suggesting right atrial pressure of 15 mmHg.   Patient Profile     72 y.o. female with a hx of history of ESRD on dialysis, paroxysmal A-fib, PE/DVT, CAD s/p PCI, lower extremity necrotizing infections s/p amputations, combined systolic and diastolic heart failure, dementia, bipolar, COPD, T2DM, HTN, and HLD  who is being seen 07/29/2022 for the evaluation of on going chest pressure x 1 week at the request of Dr. Saverio Danker.    Assessment & Plan    CAD s/p PCI ramus vessel  - presented with chest pressure for 1 week. She has known CAD with prior stenting - mild troponin elevated up to 200 - cath 3/7 showed patent ramus stent with minimal ISR, moderate proximal to mid LAD lesions with RFR 0.95, moderate ostial left Cx lesion with RFR 1.0, moderate mid RCA with an RFR 0.92. Medical therapy was recommended. - Continue Plavix '75mg'$  daily, Imdur '30mg'$  daily, toprol '25mg'$  daily and Lipitor '80mg'$  daily  ESRD on HD - per  nephrology, HD MWF  Combined systolic and diastolic heart failure - Echo showed LVEF 45-50% with global HK and G2DD - Euvolemic on exam - continue Toprol '25mg'$  daily  PAF Chronic anticoagulation - she is on reduced Eliquis dose due to h/o bleeding events - in NSR - she is off Heparin, restart low dose Eliquis  HLD - LDL 40 - Lipitor '80mg'$  daily    For questions or updates, please contact [place] Please consult [email or web address] for contact info under        Signed, Cadence Ninfa Meeker, PA-C  08/01/2022, 8:14 AM       Patient seen and examined. Agree with assessment and plan.  Rotation date and findings were reviewed.  RFR assessment of the LAD, ostial circumflex, and RCA stenoses was  not felt to be hemodynamically significant for which no intervention was performed and medical therapy is warranted.  She is undergoing dialysis. R Groin cath site stable without hematoma. OK to resume eliquis at previous reduced dose.   Troy Sine, MD, [facility] 08/01/2022 12:05 PM

## 2022-08-01 NOTE — Progress Notes (Signed)
CARDIAC REHAB PHASE I   Post nstemi education including site care, restrictions, risk factors, MI booklet, heart healthy diabetic diet, exertion precautions and CRP2 reviewed. Referral placed for [facility] CRP2 per protocol. However pt not appropriate for CRP2 due to ongoing health problems. Pt states that with HD and other health concerns CRP2 would be to difficult for her to attend. HD here to transport to HD. All questions and concerns addressed.   [id number]  Vanessa Barbara, RN BSN 08/01/2022 9:04 AM

## 2022-08-02 DIAGNOSIS — I120 Hypertensive chronic kidney disease with stage 5 chronic kidney disease or end stage renal disease: Secondary | ICD-10-CM

## 2022-08-02 DIAGNOSIS — F17210 Nicotine dependence, cigarettes, uncomplicated: Secondary | ICD-10-CM | POA: Diagnosis not present

## 2022-08-02 DIAGNOSIS — Z992 Dependence on renal dialysis: Secondary | ICD-10-CM | POA: Diagnosis not present

## 2022-08-02 DIAGNOSIS — I214 Non-ST elevation (NSTEMI) myocardial infarction: Secondary | ICD-10-CM | POA: Diagnosis not present

## 2022-08-02 DIAGNOSIS — E1122 Type 2 diabetes mellitus with diabetic chronic kidney disease: Secondary | ICD-10-CM

## 2022-08-02 DIAGNOSIS — N186 End stage renal disease: Secondary | ICD-10-CM | POA: Diagnosis not present

## 2022-08-02 DIAGNOSIS — Z794 Long term (current) use of insulin: Secondary | ICD-10-CM

## 2022-08-02 LAB — BASIC METABOLIC PANEL
Anion gap: 15 (ref 5–15)
BUN: 31 mg/dL — ABNORMAL HIGH (ref 8–23)
CO2: 24 mmol/L (ref 22–32)
Calcium: 9.1 mg/dL (ref 8.9–10.3)
Chloride: 96 mmol/L — ABNORMAL LOW (ref 98–111)
Creatinine, Ser: 4.16 mg/dL — ABNORMAL HIGH (ref 0.44–1.00)
GFR, Estimated: 11 mL/min — ABNORMAL LOW (ref >60)
Glucose, Bld: 273 mg/dL — ABNORMAL HIGH (ref 70–99)
Potassium: 3.8 mmol/L (ref 3.5–5.1)
Sodium: 135 mmol/L (ref 135–145)

## 2022-08-02 LAB — GLUCOSE, CAPILLARY
Glucose-Capillary: 213 mg/dL — ABNORMAL HIGH (ref 70–99)
Glucose-Capillary: 286 mg/dL — ABNORMAL HIGH (ref 70–99)
Glucose-Capillary: 178 mg/dL — ABNORMAL HIGH (ref 70–99)
Glucose-Capillary: 197 mg/dL — ABNORMAL HIGH (ref 70–99)
Glucose-Capillary: 219 mg/dL — ABNORMAL HIGH (ref 70–99)

## 2022-08-02 LAB — CBC
HCT: 39.4 % (ref 36.0–46.0)
Hemoglobin: 12.4 g/dL (ref 12.0–15.0)
MCH: 29.8 pg (ref 26.0–34.0)
MCHC: 31.5 g/dL (ref 30.0–36.0)
MCV: 94.7 fL (ref 80.0–100.0)
Platelets: 120 10*3/uL — ABNORMAL LOW (ref 150–400)
RBC: 4.16 MIL/uL (ref 3.87–5.11)
RDW: 19.9 % — ABNORMAL HIGH (ref 11.5–15.5)
WBC: 5.9 10*3/uL (ref 4.0–10.5)
nRBC: 0 % (ref 0.0–0.2)

## 2022-08-02 MED ORDER — ORAL CARE MOUTH RINSE: 15.0000 mL | OROMUCOSAL | Status: DC | PRN

## 2022-08-02 MED ORDER — LACTATED RINGERS IV BOLUS
250.0000 mL | Freq: Once | INTRAVENOUS | Status: AC
  Administered 2022-08-02: 250 mL via INTRAVENOUS

## 2022-08-02 MED ORDER — SODIUM CHLORIDE 0.9 % IV BOLUS
250.0000 mL | Freq: Once | INTRAVENOUS | Status: AC
  Administered 2022-08-02: 250 mL via INTRAVENOUS

## 2022-08-02 NOTE — Progress Notes (Signed)
   Chart reviewed - no further cardiac suggestions at this time. Will sign-off.  Martin will sign off.   Medication Recommendations:  as above, resume Eliquis Other recommendations (labs, testing, etc):  none Follow up as an outpatient:  Dr. Claiborne Billings or APP  Pixie Casino, MD, [facility], [place] Director of the [facility] Lipid Disorders &  Cardiovascular Risk Reduction [facility] Diplomate of the American Board of Clinical Lipidology Attending Cardiologist  Direct Dial: [phone]  Fax: [phone]  Website:  [email or web address]

## 2022-08-02 NOTE — Progress Notes (Signed)
[facility] Progress Note   Subjective:    Seen and examined patient at bedside. Noted patient had episode of hypotension and difficult to arouse overnight. Blood sugar was stable. She received 250cc NS bolus and sternal rubs from staff. Patient then slowly returned to her mental baseline and BP improved. Noted net UF 2.5L with HD yesterday. Currently awake and alert.   Objective Vitals:   08/02/22 0533 08/02/22 0646 08/02/22 0649 08/02/22 0722  BP: (!) 95/57 (!) '79/68 97/69 90/62 '$  Pulse: 77 75 79 75  Resp:      Temp:      TempSrc:      SpO2: 96% 93% 95% 94%  Weight:      Height:       Physical Exam General: Pleasant elderly female in NAD Heart:S1,S2 No M/G/R. SR on monitor. Rate 70s Lungs: CTAB Abdomen: NABS. NT Extremities: R. TMA, multiple toe amps. No LE edema Dialysis Access: L AVF + T/B  Filed Weights   07/30/22 0827 07/30/22 1225 08/01/22 1326  Weight: 87.1 kg 84.1 kg 83.2 kg    Intake/Output Summary (Last 24 hours) at 08/02/2022 0923 Last data filed at 08/02/2022 0345 Gross per 24 hour  Intake 250 ml  Output 2500 ml  Net -2250 ml    Additional Objective Labs: Basic Metabolic Panel: Recent Labs  Lab 07/29/22 1213 07/30/22 0559 07/30/22 1845 08/01/22 0302 08/02/22 0739  NA  --  137 135 136 135  K  --  3.6 3.8 4.9 3.8  CL  --  96* 93* 94* 96*  CO2  --  '22 28 26 24  '$ GLUCOSE  --  48* 209* 204* 273*  BUN  --  40* 17 39* 31*  CREATININE  --  5.54* 3.14* 5.44* 4.16*  CALCIUM  --  9.3 9.4 9.3 9.1  PHOS 4.9* 6.0*  --  5.9*  --    Liver Function Tests: Recent Labs  Lab 07/29/22 0443 07/30/22 0559 08/01/22 0302  AST 31  --   --   ALT 19  --   --   ALKPHOS 210*  --   --   BILITOT 0.7  --   --   PROT 7.0  --   --   ALBUMIN 3.1* 3.0* 3.1*   Recent Labs  Lab 07/29/22 0443  LIPASE 52*   CBC: Recent Labs  Lab 07/29/22 0443 07/30/22 0559 07/31/22 0944 08/01/22 0302 08/02/22 0621  WBC 6.0 4.3 4.5 4.9 5.9  NEUTROABS 4.0  --   --    --   --   HGB 11.9* 11.6* 12.6 11.8* 12.4  HCT 37.5 37.5 40.2 37.2 39.4  MCV 94.9 94.2 94.8 94.2 94.7  PLT 184 171 194 162 120*   Blood Culture    Component Value Date/Time   SDES BLOOD SITE NOT SPECIFIED 07/27/2020 0948   SPECREQUEST  07/27/2020 0948    BOTTLES DRAWN AEROBIC AND ANAEROBIC Blood Culture adequate volume   CULT  07/27/2020 0948    NO GROWTH 5 DAYS Performed at [facility], [place] [address], [place], [place] [address]    REPTSTATUS 08/01/2020 FINAL 07/27/2020 0948    Cardiac Enzymes: No results for input(s): "CKTOTAL", "CKMB", "CKMBINDEX", "TROPONINI" in the last 168 hours. CBG: Recent Labs  Lab 08/01/22 1439 08/01/22 1743 08/01/22 2119 08/02/22 0230 08/02/22 0800  GLUCAP 195* 306* 243* 213* 286*   Iron Studies: No results for input(s): "IRON", "TIBC", "TRANSFERRIN", "FERRITIN" in the last 72 hours. Lab Results  Component  Value Date   INR 1.3 (H) 07/25/2020   INR 1.4 (H) 01/08/2020   INR 1.6 (H) 09/16/2018   Studies/Results: DG Thoracic Spine 2 View  Result Date: 08/01/2022 CLINICAL DATA:  Back pain EXAM: THORACIC SPINE 2 VIEWS COMPARISON:  04/27/2022 FINDINGS: There is no evidence of thoracic spine fracture. Alignment is normal. Aortic atherosclerosis. IMPRESSION: Negative. Electronically Signed   By: Davina Poke D.O.   On: 08/01/2022 15:55   CARDIAC CATHETERIZATION  Addendum Date: 07/31/2022     Ost LAD to Mid LAD lesion is 60% stenosed.   Ost Cx to Prox Cx lesion is 65% stenosed.   Prox Cx to Mid Cx lesion is 20% stenosed.   Prox RCA lesion is 20% stenosed.   Ost Ramus to Ramus lesion is 20% stenosed.   Ost 1st Diag lesion is 50% stenosed.   Ost 2nd Diag lesion is 70% stenosed.   Mid RCA lesion is 60% stenosed.   Mid LAD lesion is 40% stenosed. 1.  Patent ramus stent with minimal in-stent restenosis. 2.  Moderate proximal to mid LAD lesion with an RFR of 0.95. 3.  Moderate ostial left circumflex lesion with an RFR of 1.0. 4.  Moderate mid right  coronary artery lesion with an RFR of 0.92. 5.  LVEDP of 6 mmHg. Recommendation: Medical therapy.  The results were discussed with the patient's daughter.  Result Date: 07/31/2022   Ost LAD to Mid LAD lesion is 60% stenosed.   Ost Cx to Prox Cx lesion is 65% stenosed.   Prox Cx to Mid Cx lesion is 20% stenosed.   Prox RCA lesion is 20% stenosed.   Ost Ramus to Ramus lesion is 20% stenosed.   Ost 1st Diag lesion is 50% stenosed.   Ost 2nd Diag lesion is 70% stenosed.   Mid RCA lesion is 60% stenosed.   Mid LAD lesion is 40% stenosed. 1.  Patent ramus stent with minimal in-stent restenosis. 2.  Moderate proximal to mid LAD lesion with an RFR of 0.95. 3.  Moderate ostial left circumflex lesion with an RFR of 1.0. 4.  Moderate mid right coronary artery lesion with an RFR of 0.92. 5.  LVEDP of 6 mmHg. Recommendation: Medical therapy.    Medications:  sodium chloride     ferric gluconate (FERRLECIT) IVPB 125 mg (08/01/22 1626)    acetaminophen  1,000 mg Oral Q6H   apixaban  2.5 mg Oral BID   ARIPiprazole  12 mg Oral QHS   atorvastatin  80 mg Oral QHS   clopidogrel  75 mg Oral Daily   diclofenac Sodium  4 g Topical QID   feeding supplement (NEPRO CARB STEADY)  237 mL Oral BID BM   ferric citrate  420 mg Oral BID WC   gabapentin  100 mg Oral BID   insulin aspart  0-6 Units Subcutaneous TID WC   insulin detemir  10 Units Subcutaneous QHS   isosorbide mononitrate  30 mg Oral Q breakfast   lidocaine  1 patch Transdermal Q24H   metoprolol succinate  25 mg Oral Daily   multivitamin  1 tablet Oral QHS   pantoprazole  40 mg Oral BID AC   sodium chloride flush  3 mL Intravenous Q12H   sodium chloride flush  3 mL Intravenous Q12H   umeclidinium bromide  2 puff Inhalation q morning    Dialysis Orders: [facility] M[facility]  3.5h  400/1.5  59kg  2/2 bath  AVF  Hep none  - last HD 3/4,  post wt 59.2kg - venofer '100mg'$  tiw thru 3/18 - hectorol 1 mcg IV tiw - korsuva 0.6 IV tiw - mircera 50 mcg IV q 2 wks, last 2/26,  due 3/11  Assessment/Plan: NSTEMI - IV heparin, seen by cardiology, per cards/ pmd. S/p cardiac cath yesterday-awaiting results.  ESRD - on HD MWF. Next HD 08/04/22.  HTN/ volume - Wt up on admission. Post weights have been incorrect. Net UF 3 liters with HD 07/30/2022. UF 2.5L with HD yesterday. Noted patient had episode of hypotension and difficult to arouse overnight. She then returned to baseline after NS bolus and sternal rubs. Last CXR was negative for pulmonary edema and she remains euvolemic on exam. Will lower UFG at next HD and ensure staff obtains standing weights moving forward. Anemia esrd - Hb 12.4, no esa needs.  MBD ckd - CCa in range and Phos mildly elevated. Cont IV vdra.  Atrial fib - per pmd PAD - hx foot/ toe amps H/o DVT/ pulm embolism Hx dementia/ bipolar/ schizoaffective d/o   Tobie Poet, NP [place] 08/02/2022,9:23 AM  LOS: 3 days

## 2022-08-02 NOTE — Evaluation (Signed)
Occupational Therapy Evaluation Patient Details Name: Robin [name] MRN: [id number] DOB: [birth year] Today's Date: 08/02/2022   History of Present Illness 71 yo female with onset of chest pain and cardiac event was admitted 3/5 with NSTEMI and had low BP readings.  Heart cath 3/8, EF 45-50%, global heart mm hypokinesis.  PMHx:  CAD, PCI, RLE amp with necrotizing ischemic infections, ESRD, dementia, a-fib, CHF, COPD, DM, HTN, HLD,   Clinical Impression   Pt admitted for concerns listed above. PTA pt reported that she was independent with all ADL's and IADL's, including functional mobility with a RW. At this time, pt presents with increased weakness, impulsivity, and balance deficits. She is requiring mod-max A +2 for mobility, including bed mobility. Recommending SNF level therapies at this time to maximize her independence and safety prior to returning home alone. OT will follow acutely.       Recommendations for follow up therapy are one component of a multi-disciplinary discharge planning process, led by the attending physician.  Recommendations may be updated based on patient status, additional functional criteria and insurance authorization.   Follow Up Recommendations  Skilled nursing-short term rehab (<3 hours/day)     Assistance Recommended at Discharge Frequent or constant Supervision/Assistance  Patient can return home with the following Two people to help with walking and/or transfers;Two people to help with bathing/dressing/bathroom;Assistance with cooking/housework;Direct supervision/assist for medications management;Help with stairs or ramp for entrance;Direct supervision/assist for financial management    Functional Status Assessment  Patient has had a recent decline in their functional status and demonstrates the ability to make significant improvements in function in a reasonable and predictable amount of time.  Equipment Recommendations  Other (comment) (Defer to next venue of  care)    Recommendations for Other Services       Precautions / Restrictions Precautions Precautions: Fall Precaution Comments: monitor BP and HR Restrictions Weight Bearing Restrictions: No      Mobility Bed Mobility Overal bed mobility: Needs Assistance Bed Mobility: Supine to Sit, Sit to Supine     Supine to sit: Mod assist, +2 for physical assistance, +2 for safety/equipment Sit to supine: Mod assist, +2 for physical assistance, +2 for safety/equipment   General bed mobility comments: mod assist due to pain and the difficulty of managing back and limitations of HD on LUE today    Transfers Overall transfer level: Needs assistance Equipment used: Rolling walker (2 wheels), 1 person hand held assist, 2 person hand held assist Transfers: Sit to/from Stand Sit to Stand: Mod assist, +2 physical assistance, +2 safety/equipment           General transfer comment: two person due to weakness post HD and light headed feeling      Balance Overall balance assessment: Needs assistance Sitting-balance support: Feet supported Sitting balance-Leahy Scale: Fair     Standing balance support: Bilateral upper extremity supported, During functional activity Standing balance-Leahy Scale: Poor Standing balance comment: direct support needed to balance in standing at walker                           ADL either performed or assessed with clinical judgement   ADL Overall ADL's : Needs assistance/impaired Eating/Feeding: Set up;Sitting   Grooming: Minimal assistance;Standing   Upper Body Bathing: Minimal assistance;Sitting   Lower Body Bathing: Moderate assistance;Maximal assistance;Sitting/lateral leans;Sit to/from stand   Upper Body Dressing : Minimal assistance;Sitting   Lower Body Dressing: Maximal assistance;Sitting/lateral leans;Sit to/from stand   Toilet  Transfer: Moderate assistance;+2 for safety/equipment;Stand-pivot   Toileting- Clothing Manipulation  and Hygiene: Maximal assistance;Moderate assistance;Sitting/lateral lean;Sit to/from stand       Functional mobility during ADLs: Moderate assistance;+2 for safety/equipment;Rolling walker (2 wheels) General ADL Comments: Pr limited by weakness and pain, requiring up to +2 assist for ADL's and functional mobility     Vision Baseline Vision/History: 1 Wears glasses Ability to See in Adequate Light: 0 Adequate Patient Visual Report: No change from baseline Vision Assessment?: No apparent visual deficits     Perception Perception Perception Tested?: No   Praxis Praxis Praxis tested?: Not tested    Pertinent Vitals/Pain Pain Assessment Pain Assessment: 0-10 Pain Score: 8  Pain Location: chest Pain Descriptors / Indicators: Tightness, Radiating Pain Intervention(s): Limited activity within patient's tolerance, Monitored during session, Repositioned     Hand Dominance Right   Extremity/Trunk Assessment Upper Extremity Assessment Upper Extremity Assessment: Generalized weakness   Lower Extremity Assessment Lower Extremity Assessment: Defer to PT evaluation   Cervical / Trunk Assessment Cervical / Trunk Assessment: Kyphotic   Communication Communication Communication: No difficulties   Cognition Arousal/Alertness: Awake/alert Behavior During Therapy: Anxious Overall Cognitive Status: History of cognitive impairments - at baseline                                       General Comments  VSS on RA    Exercises     Shoulder Instructions      Home Living Family/patient expects to be discharged to:: Private residence Living Arrangements: Other relatives;Alone Available Help at Discharge: Family;Available PRN/intermittently Type of Home: House Home Access: Stairs to enter [employer] of Steps: 2   Home Layout: One level     Bathroom Shower/Tub: Tub/shower unit;Walk-in shower   Bathroom Toilet: Standard     Home Equipment: Chartered certified accountant (2 wheels);Shower seat;BSC/3in1;Wheelchair - manual          Prior Functioning/Environment Prior Level of Function : Independent/Modified Independent;Patient poor historian/Family not available             Mobility Comments: able to self mobilize on RW ADLs Comments: Per pt report she is indep with all ADL's and IADL's        OT Problem List: Decreased strength;Decreased range of motion;Decreased activity tolerance;Impaired balance (sitting and/or standing);Decreased safety awareness;Decreased cognition;Cardiopulmonary status limiting activity;Impaired UE functional use;Pain      OT Treatment/Interventions: Self-care/ADL training;Therapeutic exercise;Energy conservation;DME and/or AE instruction;Therapeutic activities;Patient/family education;Balance training    OT Goals(Current goals can be found in the care plan section) Acute Rehab OT Goals Patient Stated Goal: To go home OT Goal Formulation: With patient Time For Goal Achievement: 08/16/22 Potential to Achieve Goals: Fair ADL Goals Pt Will Perform Grooming: with supervision;standing Pt Will Perform Lower Body Bathing: with min assist;sitting/lateral leans;sit to/from stand Pt Will Perform Lower Body Dressing: with min assist;sitting/lateral leans;sit to/from stand Pt Will Transfer to Toilet: with min assist;ambulating Pt Will Perform Toileting - Clothing Manipulation and hygiene: with min assist;sitting/lateral leans;sit to/from stand  OT Frequency: Min 2X/week    Co-evaluation PT/OT/SLP Co-Evaluation/Treatment: Yes Reason for Co-Treatment: Complexity of the patient's impairments (multi-system involvement);For patient/therapist safety   OT goals addressed during session: ADL's and self-care;Strengthening/ROM      AM-PAC OT "6 Clicks" Daily Activity     Outcome Measure Help from another person eating meals?: A Little Help from another person taking care of personal grooming?: A Little Help from  another person  toileting, which includes using toliet, bedpan, or urinal?: A Lot Help from another person bathing (including washing, rinsing, drying)?: A Lot Help from another person to put on and taking off regular upper body clothing?: A Little Help from another person to put on and taking off regular lower body clothing?: A Lot 6 Click Score: 15   End of Session Equipment Utilized During Treatment: Gait belt;Rolling walker (2 wheels) Nurse Communication: Mobility status  Activity Tolerance: Patient tolerated treatment well Patient left: in bed;with call bell/phone within reach;with bed alarm set  OT Visit Diagnosis: Unsteadiness on feet (R26.81);Other abnormalities of gait and mobility (R26.89);Muscle weakness (generalized) (M62.81)                Time: [id number] OT Time Calculation (min): 23 min Charges:  OT General Charges $OT Visit: 1 Visit OT Evaluation $OT Eval Moderate Complexity: [place], OTR/L [place] [facility]  Kasyn Rolph Elane Yolanda Bonine 08/02/2022, 3:17 PM

## 2022-08-02 NOTE — Progress Notes (Signed)
Physical Therapy Evaluation Patient Details Name: Robin [name] MRN: [id number] DOB: 11-20-1950 Today's Date: 08/02/2022  History of Present Illness  72 yo female with onset of chest pain and cardiac event was admitted 3/5 with NSTEMI and had low BP readings.  Heart cath 3/8, EF 45-50%, global heart mm hypokinesis.  PMHx:  CAD, PCI, RLE amp with necrotizing ischemic infections, ESRD, dementia, a-fib, CHF, COPD, DM, HTN, HLD,  Clinical Impression  Pt was seen for mobility from bedside with OT and PT due to extent of pt weakness and her recent HD tx.  Pt is light headed, noted BP readings as follow:  supine 99/65 (77), sitting 106/61 (74), standing 103/56 (72).  Pulses were in 70's and sats 100%.  Pt is symptomatic and tired to move, was finally unable to get to walk due to unsteady buckled standing attempt.  Follow up with her to go to SNF for strengthening and to increase safe independent gait before returning home alone.  Pt is going to be seen for PT goals as are outlined below.       Recommendations for follow up therapy are one component of a multi-disciplinary discharge planning process, led by the attending physician.  Recommendations may be updated based on patient status, additional functional criteria and insurance authorization.  Follow Up Recommendations Skilled nursing-short term rehab (<3 hours/day) Can patient physically be transported by private vehicle: No    Assistance Recommended at Discharge Frequent or constant Supervision/Assistance  Patient can return home with the following  Two people to help with walking and/or transfers;A lot of help with bathing/dressing/bathroom;Assistance with cooking/housework;Direct supervision/assist for medications management;Direct supervision/assist for financial management;Assist for transportation;Help with stairs or ramp for entrance    Equipment Recommendations None recommended by PT  Recommendations for Other Services       Functional  Status Assessment Patient has had a recent decline in their functional status and demonstrates the ability to make significant improvements in function in a reasonable and predictable amount of time.     Precautions / Restrictions Precautions Precautions: Fall Precaution Comments: monitor BP and HR Restrictions Weight Bearing Restrictions: No      Mobility  Bed Mobility Overal bed mobility: Needs Assistance Bed Mobility: Supine to Sit, Sit to Supine     Supine to sit: Mod assist, +2 for physical assistance, +2 for safety/equipment Sit to supine: Mod assist, +2 for physical assistance, +2 for safety/equipment   General bed mobility comments: mod assist due to pain and the difficulty of managing back and limitations of HD on LUE today    Transfers Overall transfer level: Needs assistance Equipment used: Rolling walker (2 wheels), 1 person hand held assist, 2 person hand held assist Transfers: Sit to/from Stand Sit to Stand: Mod assist, +2 physical assistance, +2 safety/equipment           General transfer comment: two person due to weakness post HD and light headed feeling    Ambulation/Gait               General Gait Details: unable upon attempting  Stairs            Wheelchair Mobility    Modified Rankin (Stroke Patients Only)       Balance Overall balance assessment: Needs assistance Sitting-balance support: Feet supported Sitting balance-Leahy Scale: Fair     Standing balance support: Bilateral upper extremity supported, During functional activity Standing balance-Leahy Scale: Poor Standing balance comment: direct support needed to balance in standing at walker  Pertinent Vitals/Pain Pain Assessment Pain Assessment: 0-10 Pain Score: 8  Pain Location: chest (cleared PT and OT visit with nursing) Pain Descriptors / Indicators: Tightness, Radiating Pain Intervention(s): Limited activity within patient's  tolerance, Monitored during session, Repositioned    Home Living Family/patient expects to be discharged to:: Private residence Living Arrangements: Other relatives;Alone Available Help at Discharge: Family;Available PRN/intermittently Type of Home: House Home Access: Stairs to enter   Entrance Stairs-Number of Steps: 2   Home Layout: One level Home Equipment: Conservation officer, nature (2 wheels);Shower seat;BSC/3in1;Wheelchair - manual      Prior Function Prior Level of Function : Independent/Modified Independent             Mobility Comments: able to self mobilize on RW       Hand Dominance        Extremity/Trunk Assessment   Upper Extremity Assessment Upper Extremity Assessment: Defer to OT evaluation    Lower Extremity Assessment Lower Extremity Assessment: Generalized weakness    Cervical / Trunk Assessment Cervical / Trunk Assessment: Kyphotic  Communication   Communication: No difficulties  Cognition Arousal/Alertness: Awake/alert Behavior During Therapy: Anxious Overall Cognitive Status: History of cognitive impairments - at baseline                                          General Comments General comments (skin integrity, edema, etc.): Pt is up to walk with help to stand but then could not take a step, very tired and had low BP readings    Exercises     Assessment/Plan    PT Assessment Patient needs continued PT services  PT Problem List         PT Treatment Interventions      PT Goals (Current goals can be found in the Care Plan section)  Acute Rehab PT Goals Patient Stated Goal: to try to be up and moving PT Goal Formulation: With patient Time For Goal Achievement: 08/16/22 Potential to Achieve Goals: Good    Frequency Min 2X/week     Co-evaluation PT/OT/SLP Co-Evaluation/Treatment: Yes Reason for Co-Treatment: Complexity of the patient's impairments (multi-system involvement);For patient/therapist safety            AM-PAC PT "6 Clicks" Mobility  Outcome Measure Help needed turning from your back to your side while in a flat bed without using bedrails?: A Little Help needed moving from lying on your back to sitting on the side of a flat bed without using bedrails?: A Lot Help needed moving to and from a bed to a chair (including a wheelchair)?: A Lot Help needed standing up from a chair using your arms (e.g., wheelchair or bedside chair)?: Total Help needed to walk in hospital room?: Total Help needed climbing 3-5 steps with a railing? : Total 6 Click Score: 10    End of Session Equipment Utilized During Treatment: Gait belt Activity Tolerance: Patient limited by fatigue;Patient limited by pain;Treatment limited secondary to medical complications (Comment) Patient left: in bed;with call bell/phone within reach;with bed alarm set Nurse Communication: Mobility status;Other (comment) (BP readings) PT Visit Diagnosis: Unsteadiness on feet (R26.81);Muscle weakness (generalized) (M62.81);Other abnormalities of gait and mobility (R26.89)    Time: [id number] PT Time Calculation (min) (ACUTE ONLY): 25 min   Charges:   PT Evaluation $PT Eval Moderate Complexity: 1 Mod         Ramond Dial 08/02/2022, 3:10 PM  Rod Holler  Lorenda Cahill, PT PhD Acute Rehab Dept. Number: [place] [facility] [place]

## 2022-08-02 NOTE — Progress Notes (Signed)
   08/02/22 0157  Vitals  BP (!) 77/56  MAP (mmHg) (!) 64  ECG Heart Rate 68  Level of Consciousness  Level of Consciousness Responds to Pain  MEWS Score  MEWS Temp 0  MEWS Systolic 2  MEWS Pulse 0  MEWS RR 0  MEWS LOC 2  MEWS Score 4  MEWS Score Color Red   RN noted at 0220 Pt bp had dropped. RN went and assessed pt and repeated her vital signs. Pt would not respond to verbal stimuli and would briefly respond to painful stimuli then fall back asleep. CBG was obtained 212. RN notified MD's of the situation. They responded to bed side. RRaymondo Band DO  gave verbal orders for a 250 ns bolus. RN gave bolus. With repeated painful stimuli the pt did arouse and at first was agitated, but with in a few min returned to her baseline mentation and bp improved.RN left pt sitting in bed vss. RN called and updated pt's daughter.

## 2022-08-02 NOTE — Progress Notes (Signed)
HD#3 SUBJECTIVE:  Patient Summary: Robin [name] is a 72 y.o. 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    Overnight Events:hypotensive 71/49   OBJECTIVE:  Vital Signs: Vitals:   08/02/22 0646 08/02/22 0649 08/02/22 0722 08/02/22 1147  BP: (!) '79/68 97/69 90/62 '$ 99/67  Pulse: 75 79 75 80  Resp:    18  Temp:    98.2 F (36.8 C)  TempSrc:    Oral  SpO2: 93% 95% 94% 99%  Weight:      Height:       Constitutional:      General: She is not in acute distress. HENT:     Head: Normocephalic and atraumatic.  Cardiovascular:     Rate and Rhythm: Normal rate and regular rhythm.     Comments: S3 vs S4 heard Pulmonary:     Effort: Pulmonary effort is normal. No respiratory distress.     Breath sounds: Normal breath sounds.  Musculoskeletal:        General: No signs of injury. Normal range of motion.  Skin:    General: Skin is warm and dry.  Neurological:     Mental Status: She is alert and oriented Psychiatric:        Mood and Affect: Mood normal.        Behavior: Behavior normal.    ASSESSMENT/PLAN:  Assessment: Principal Problem:   NSTEMI (non-ST elevated myocardial infarction) ([place]) Active Problems:   Chest pain   PAC (premature atrial contraction)   Hyperlipidemia with target LDL less than 70   Chest tightness   Robin [name] is a 72 y.o. 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 Currently on hospital day 3.   Plan: #NSTEMI #Hx of CAD s/p PCI No symptoms today.  Patient had cath yesterday.  Did become  hypotensive overnight.  Eliquis was restarted.  She is needed several fluid boluses but this is complicated by her ESRD and we cannot overload her.  Her amlodipine and metoprolol are being held in the setting of this hypotension.  Family member had previously reported that amlodipine causes prolonged hypotension in this patient.  Need to ensure that there is no developing groin hematoma.  She does have a history of DVT but has been taking her Eliquis reliably if it is lower on the differential.  Will continue to monitor throughout the afternoon, if she remains stable she will be safe to discharge home.  Would resume her metoprolol after being seen at follow-up. -Continue clopidogrel '75mg'$  daily -Continue Eliquis. -Continue imdur '30mg'$  daily  -Continue atorvastatin to 80 mg daily -Nitro PRN -Telemetry   #Hx of combined systolic and diastolic HF #Hx of atrial fibrillation Echo here demonstrates EF 45-50% with global hypokinesis of the LV w/ G2DD. GDMT limited by ESRD. No signs of volume overload on exam. On eliquis at home. Remains in sinus rhythm.  Metoprolol being held in the setting of hypotension. -Continue low dose eliquis   #ESRD on HD (MWF) hypotensive this morning.  Cautious fluid resuscitation with boluses. -Continue ferric citrate 520 mg BID  #Type 2 DM Most recent A1c in  04/2022 of 7.0%. Patient eating well.  -Continue SSI -Continue levemir to 10 U QHS -Trend CBGs   #PAD Has hx of bilateral LE arterial disease. S/p LLE SFA stent in 2023. Hospitalized last year for infectious/ischemic disease requiring right TMA and left 1st toe amputation. -Continue atorvastatin 40 mg daily -Continue clopidogrel 75 mg daily  #Schizoaffective & bipolar disorder #Dementia Follows w/ [facility] mental health as outpatient. Taking Abilify 12 mg daily.  -Continue Abilify 12 mg daily -[facility]/ [facility]  #HTN Holding Lisinopril 2.5 mg for now. Will reassess BP following HD.    Best Practice: Diet: Renal diet IVF: Fluids: none VTE: heparin ggt, plavix Code: Full Therapy Recs:  pending , PT/OT missed patient today due to hemodialysis Family Contact: daughter, to be notified. DISPO: Anticipated discharge in 2 days to Home pending  continued management of NSTEMI .   Delene Ruffini, MD

## 2022-08-02 NOTE — Progress Notes (Signed)
PT Cancellation Note  Patient Details Name: Robin [name] MRN: [id number] DOB: 07-Feb-1951   Cancelled Treatment:    Reason Eval/Treat Not Completed: Other (comment).  Pt is eating a meal very slowly and will retry as time and pt allow.   Ramond Dial 08/02/2022, 9:59 AM  Mee Hives, PT PhD Acute Rehab Dept. Number: [place] and [place]

## 2022-08-02 NOTE — Progress Notes (Signed)
Clinical update:  Paged by RN for hypotension and patient becoming difficult to arouse at 0230. Of note, patient had an episode of hypotension earlier in the day after getting her antihypertensives, however, mental status was unchanged.   Dr. Markus Jarvis and I arrived at the bedside and initial BP was 71/49 and patient was asleep. We attempted to wake the patient up, however, she did not respond to her verbal stimuli. She was difficult to arouse even with painful stimuli, however, would withdraw her extremities. Phlebotomy was in the room attempting to collect AM labs, however, patient screamed "stop it" and then we did not collect labs. She immediately fell back asleep after this.   After multiple rounds of Dr. Markus Jarvis and I attempting to wake the patient with sternal rub, she finally woke up and screamed "stop it" and about 30 seconds later, realized who the team was and said "oh hey doc". Explained the situation to the patient and that her BP dropped and we needed to re-assess her. She understood and was back to baseline at this time. She also asked if it was time for breakfast yet.   Gave 250 cc fluid bolus for hypotension. While fluid was running, BP improved to 112/60. Suspect that patient was difficult to arouse as she was in a deep sleep, and also, may have a component of hospital delirium. Will continue to monitor her BP and mental status. No other changes at this time.   Buddy Duty, DO  Internal Medicine Resident, PGY-2

## 2022-08-03 DIAGNOSIS — Z992 Dependence on renal dialysis: Secondary | ICD-10-CM | POA: Diagnosis not present

## 2022-08-03 DIAGNOSIS — E1122 Type 2 diabetes mellitus with diabetic chronic kidney disease: Secondary | ICD-10-CM | POA: Diagnosis not present

## 2022-08-03 DIAGNOSIS — I214 Non-ST elevation (NSTEMI) myocardial infarction: Secondary | ICD-10-CM | POA: Diagnosis not present

## 2022-08-03 DIAGNOSIS — N186 End stage renal disease: Secondary | ICD-10-CM | POA: Diagnosis not present

## 2022-08-03 LAB — GLUCOSE, CAPILLARY
Glucose-Capillary: 259 mg/dL — ABNORMAL HIGH (ref 70–99)
Glucose-Capillary: 281 mg/dL — ABNORMAL HIGH (ref 70–99)
Glucose-Capillary: 327 mg/dL — ABNORMAL HIGH (ref 70–99)
Glucose-Capillary: 374 mg/dL — ABNORMAL HIGH (ref 70–99)

## 2022-08-03 LAB — LIPOPROTEIN A (LPA): Lipoprotein (a): 123.7 nmol/L — ABNORMAL HIGH (ref <75.0)

## 2022-08-03 MED ORDER — LIDOCAINE-PRILOCAINE 2.5-2.5 % EX CREA: 1.0000 | TOPICAL_CREAM | CUTANEOUS | Status: DC | PRN

## 2022-08-03 MED ORDER — LIDOCAINE HCL (PF) 1 % IJ SOLN: 5.0000 mL | INTRAMUSCULAR | Status: DC | PRN

## 2022-08-03 MED ORDER — INSULIN DETEMIR 100 UNIT/ML ~~LOC~~ SOLN
15.0000 [IU] | Freq: Every day | SUBCUTANEOUS | Status: DC
  Administered 2022-08-03: 15 [IU] via SUBCUTANEOUS
  Filled 2022-08-03 (×2): qty 0.15

## 2022-08-03 MED ORDER — HEPARIN SODIUM (PORCINE) 1000 UNIT/ML DIALYSIS: 1000.0000 [IU] | INTRAMUSCULAR | Status: DC | PRN

## 2022-08-03 MED ORDER — PENTAFLUOROPROP-TETRAFLUOROETH EX AERO: 1.0000 | INHALATION_SPRAY | CUTANEOUS | Status: DC | PRN

## 2022-08-03 MED ORDER — ALTEPLASE 2 MG IJ SOLR: 2.0000 mg | Freq: Once | INTRAMUSCULAR | Status: DC | PRN

## 2022-08-03 MED ORDER — CHLORHEXIDINE GLUCONATE CLOTH 2 % EX PADS
6.0000 | MEDICATED_PAD | Freq: Every day | CUTANEOUS | Status: DC
  Administered 2022-08-04 – 2022-08-05 (×2): 6 via TOPICAL

## 2022-08-03 NOTE — Progress Notes (Signed)
HD#4 SUBJECTIVE:  Patient Summary: Robin [name] is a 72 y.o. 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    Overnight Events: none  Patient awake and states chronic back pain still bothering her. States she has had falls in the past. No shortness of breath. No new concerns.   OBJECTIVE:  Vital Signs: Vitals:   08/02/22 1926 08/02/22 2334 08/03/22 0433 08/03/22 0457  BP: (!) 121/54 118/66 134/66   Pulse: 80 76 78   Resp: '18 16 16   '$ Temp: 98.4 F (36.9 C) 97.7 F (36.5 C) 97.8 F (36.6 C)   TempSrc: Oral Oral Oral Oral  SpO2: 93% 96% 94%   Weight:      Height:       Constitutional:      General: Awake laying in bed comfortably. She is not in acute distress. HENT:     Head: Normocephalic and atraumatic.  Cardiovascular:     Rate and Rhythm: Regular rate and rhythm Pulmonary:     Effort: Pulmonary effort is normal on room air. No respiratory distress.     Breath sounds: Normal breath sounds.  Musculoskeletal:        General: No signs of injury. Normal range of motion.  Skin:    General: Skin is warm and dry.  Neurological:     Mental Status: She is alert and oriented Psychiatric:        Mood and Affect: Mood normal.        Behavior: Behavior normal.    ASSESSMENT/PLAN:  Assessment: Principal Problem:   NSTEMI (non-ST elevated myocardial infarction) ([place]) Active Problems:   Chest pain   PAC (premature atrial contraction)   Hyperlipidemia with target LDL less than 70   Chest tightness   Robin [name] is a 72 y.o. with pertinent PMH of ESRD, dementia, CAD s/p PCI, lower extremity necrotizing ischemic/infectious s/p amputations, atrial fibrillation, combined heart failure, COPD, T2DM, HTN, and HLD who presents for a week of chest pressure  and admitted for further cardiac evaluation.   Plan: #NSTEMI #Hx of CAD s/p PCI Presented with week of chest pressure that radiates to left neck. Did not mention about chest pressure today but still has back pain that appears to be chronic. Left heart cath on 3/7, recommend continue medical therapy. Amlodipine held in setting of hypotension yesterday. Of note, family reported amlodipine causes prolonged hypotension for patient. BP this morning 110s/60-70 and no overnight hypotension documented. PT and OT recommend SNF. Discussed with patient about this and is willing to go but would like to discuss with patient's daughter.  -telemetry  -continue Plavix 75 mg daily -continue Eliquis 2.5 mg BID -continue Imdur 30 mg daily -continue metoprolol succinate 25 mg daily  -continue atorvastatin 80 daily -nitro PRN   #Hx of combined systolic and diastolic HF #Hx of atrial fibrillation on anticoagulation #Hx of PE and DVT on anticoagulation Echo showed EF 45-50% with global hypokinesis of LV and G2DD. GDMT limited given ESRD. Does not appear volume overloaded today. Telemetry shows sinus rhythm.  -continue metoprolol succinate 25 mg daily -continue Eliquis 2.5 mg BID   #ESRD on HD (MWF) Nephrology following. Last HD on Friday.  -continue ferric citrate 520 mg BID  #Type 2 DM A1c 7% in December. CBG have been above goal  with AM glucose 259. On levemir 10 units now but at home reported to be 15 units.   -CBG monitoring -on SSI -increase levemir to home dose of 15 units QHS   #PAD Has hx of bilateral LE arterial disease s/p LLE SFA stent in 2023. Noted hospitalization last year for infectious/ischemic disease requiring right TMA and left 1st toe amputation.  -continue atorvastatin 40 mg daily -continue Plavix 75 mg daily  #HTN #HLD Holding home lisinopril 2.5 mg now. Had overnight hypotension on 3/8. BP has been 110s/60s. Patient on atorvastatin 40 mg at home.  -hold lisinopril -continue  home atorvastatin   #GERD -continue home Protonix 40 mg BID  #Schizoaffective & bipolar disorder #Dementia Follows w/ [facility] for mental health. On Abilify 12 mg daily -continue home Abilify -continue f/u with [facility]   #Back Pain Still has thoracolumbar back pain but patient states not worsening. Suspect likely MSK in nature given reproducibility on previous exams. Imaging unrevealing of acute process.  -continue scheduled tylenol 1000 mg q6h -lidocaine patch  -voltaren gel    Best Practice: Diet: Renal/CM IVF: Fluids: none VTE: Eliquis Code: Full Therapy Recs:  PT & OT - SNF Family Contact: will call daughter today DISPO: Anticipated discharge pending medical stability and possible SNF  Angelique Blonder, DO Internal Medicine Resident PGY-1 Pager: [phone] Please contact the on call pager after 5 pm and on weekends at [phone]

## 2022-08-03 NOTE — Progress Notes (Signed)
[facility] Progress Note   Subjective:    Seen and examined patient at bedside. No acute issues. Next HD 08/04/22.  Objective Vitals:   08/03/22 0433 08/03/22 0457 08/03/22 0801 08/03/22 0941  BP: 134/66  115/74 112/65  Pulse: 78  78   Resp: 16  15   Temp: 97.8 F (36.6 C)  97.8 F (36.6 C)   TempSrc: Oral Oral Oral   SpO2: 94%  94%   Weight:      Height:       Physical Exam General: Pleasant elderly female in NAD Heart:S1,S2 No M/G/R.  Lungs: CTAB Abdomen: NABS. NT Extremities: R. TMA, multiple toe amps. No LE edema Dialysis Access: L AVF + T/B  Filed Weights   07/30/22 0827 07/30/22 1225 08/01/22 1326  Weight: 87.1 kg 84.1 kg 83.2 kg    Intake/Output Summary (Last 24 hours) at 08/03/2022 1421 Last data filed at 08/02/2022 2030 Gross per 24 hour  Intake 480 ml  Output --  Net 480 ml    Additional Objective Labs: Basic Metabolic Panel: Recent Labs  Lab 07/29/22 1213 07/30/22 0559 07/30/22 1845 08/01/22 0302 08/02/22 0739  NA  --  137 135 136 135  K  --  3.6 3.8 4.9 3.8  CL  --  96* 93* 94* 96*  CO2  --  '22 28 26 24  '$ GLUCOSE  --  48* 209* 204* 273*  BUN  --  40* 17 39* 31*  CREATININE  --  5.54* 3.14* 5.44* 4.16*  CALCIUM  --  9.3 9.4 9.3 9.1  PHOS 4.9* 6.0*  --  5.9*  --    Liver Function Tests: Recent Labs  Lab 07/29/22 0443 07/30/22 0559 08/01/22 0302  AST 31  --   --   ALT 19  --   --   ALKPHOS 210*  --   --   BILITOT 0.7  --   --   PROT 7.0  --   --   ALBUMIN 3.1* 3.0* 3.1*   Recent Labs  Lab 07/29/22 0443  LIPASE 52*   CBC: Recent Labs  Lab 07/29/22 0443 07/30/22 0559 07/31/22 0944 08/01/22 0302 08/02/22 0621  WBC 6.0 4.3 4.5 4.9 5.9  NEUTROABS 4.0  --   --   --   --   HGB 11.9* 11.6* 12.6 11.8* 12.4  HCT 37.5 37.5 40.2 37.2 39.4  MCV 94.9 94.2 94.8 94.2 94.7  PLT 184 171 194 162 120*   Blood Culture    Component Value Date/Time   SDES BLOOD SITE NOT SPECIFIED 07/27/2020 0948   SPECREQUEST  07/27/2020  0948    BOTTLES DRAWN AEROBIC AND ANAEROBIC Blood Culture adequate volume   CULT  07/27/2020 0948    NO GROWTH 5 DAYS Performed at [facility], [place] [address] [place], [place] [address]    REPTSTATUS 08/01/2020 FINAL 07/27/2020 0948    Cardiac Enzymes: No results for input(s): "CKTOTAL", "CKMB", "CKMBINDEX", "TROPONINI" in the last 168 hours. CBG: Recent Labs  Lab 08/02/22 1154 08/02/22 1604 08/02/22 2057 08/03/22 0804 08/03/22 1202  GLUCAP 178* 197* 219* 259* 281*   Iron Studies: No results for input(s): "IRON", "TIBC", "TRANSFERRIN", "FERRITIN" in the last 72 hours. Lab Results  Component Value Date   INR 1.3 (H) 07/25/2020   INR 1.4 (H) 01/08/2020   INR 1.6 (H) 09/16/2018   Studies/Results: DG Thoracic Spine 2 View  Result Date: 08/01/2022 CLINICAL DATA:  Back pain EXAM: THORACIC SPINE 2 VIEWS COMPARISON:  04/27/2022 FINDINGS: There is no evidence of thoracic spine fracture. Alignment is normal. Aortic atherosclerosis. IMPRESSION: Negative. Electronically Signed   By: Davina Poke D.O.   On: 08/01/2022 15:55    Medications:  sodium chloride     ferric gluconate (FERRLECIT) IVPB 125 mg (08/01/22 1626)    acetaminophen  1,000 mg Oral Q6H   apixaban  2.5 mg Oral BID   ARIPiprazole  12 mg Oral QHS   atorvastatin  80 mg Oral QHS   clopidogrel  75 mg Oral Daily   diclofenac Sodium  4 g Topical QID   feeding supplement (NEPRO CARB STEADY)  237 mL Oral BID BM   ferric citrate  420 mg Oral BID WC   gabapentin  100 mg Oral BID   insulin aspart  0-6 Units Subcutaneous TID WC   insulin detemir  15 Units Subcutaneous QHS   isosorbide mononitrate  30 mg Oral Q breakfast   lidocaine  1 patch Transdermal Q24H   metoprolol succinate  25 mg Oral Daily   multivitamin  1 tablet Oral QHS   pantoprazole  40 mg Oral BID AC   sodium chloride flush  3 mL Intravenous Q12H   sodium chloride flush  3 mL Intravenous Q12H   umeclidinium bromide  2 puff Inhalation q morning     Dialysis Orders: [place] MWF  3.5h  400/1.5  59kg  2/2 bath  AVF  Hep none  - last HD 3/4, post wt 59.2kg - venofer '100mg'$  tiw thru 3/18 - hectorol 1 mcg IV tiw - korsuva 0.6 IV tiw - mircera 50 mcg IV q 2 wks, last 2/26, due 3/11  Assessment/Plan: NSTEMI - IV heparin, seen by cardiology, per cards/ pmd. S/p cardiac cath 3/7-per cardiology, medical management is recommended.  ESRD - on HD MWF. Next HD 08/04/22.  HTN/ volume - Wt up on admission. Post weights have been incorrect. Net UF 3 liters with HD 07/30/2022. UF 2.5L with HD yesterday. Noted patient had episode of hypotension and difficult to arouse on 3/9. She then returned to baseline after NS bolus and sternal rubs. Last CXR was negative for pulmonary edema and she remains euvolemic on exam. No issues noted overnight. Will lower UFG at next HD and ensure staff obtains standing weights moving forward. Anemia esrd - Hb 12.4, no esa needs.  MBD ckd - CCa in range and Phos mildly elevated. Cont IV vdra.  Atrial fib - per pmd PAD - hx foot/ toe amps H/o DVT/ pulm embolism Hx dementia/ bipolar/ schizoaffective d/o   Tobie Poet, NP [facility] 08/03/2022,2:21 PM  LOS: 4 days

## 2022-08-04 DIAGNOSIS — I214 Non-ST elevation (NSTEMI) myocardial infarction: Secondary | ICD-10-CM | POA: Diagnosis not present

## 2022-08-04 DIAGNOSIS — F17210 Nicotine dependence, cigarettes, uncomplicated: Secondary | ICD-10-CM

## 2022-08-04 LAB — GLUCOSE, CAPILLARY
Glucose-Capillary: 441 mg/dL — ABNORMAL HIGH (ref 70–99)
Glucose-Capillary: 183 mg/dL — ABNORMAL HIGH (ref 70–99)
Glucose-Capillary: 295 mg/dL — ABNORMAL HIGH (ref 70–99)
Glucose-Capillary: 161 mg/dL — ABNORMAL HIGH (ref 70–99)

## 2022-08-04 MED ORDER — INSULIN DETEMIR 100 UNIT/ML ~~LOC~~ SOLN
18.0000 [IU] | Freq: Every day | SUBCUTANEOUS | Status: DC
  Administered 2022-08-04: 18 [IU] via SUBCUTANEOUS
  Filled 2022-08-04 (×3): qty 0.18

## 2022-08-04 MED ORDER — ISOSORBIDE MONONITRATE ER 30 MG PO TB24
30.0000 mg | ORAL_TABLET | Freq: Every day | ORAL | Status: DC
  Administered 2022-08-04 – 2022-08-05 (×2): 30 mg via ORAL
  Filled 2022-08-04 (×2): qty 1

## 2022-08-04 MED ORDER — INSULIN ASPART 100 UNIT/ML IJ SOLN
0.0000 [IU] | Freq: Three times a day (TID) | INTRAMUSCULAR | Status: DC
  Administered 2022-08-04: 3 [IU] via SUBCUTANEOUS
  Administered 2022-08-04 – 2022-08-05 (×2): 8 [IU] via SUBCUTANEOUS
  Administered 2022-08-05: 11 [IU] via SUBCUTANEOUS

## 2022-08-04 MED ORDER — INSULIN ASPART 100 UNIT/ML IJ SOLN
8.0000 [IU] | Freq: Once | INTRAMUSCULAR | Status: AC
  Administered 2022-08-04: 8 [IU] via SUBCUTANEOUS

## 2022-08-04 MED ORDER — FERRIC CITRATE 1 GM 210 MG(FE) PO TABS
630.0000 mg | ORAL_TABLET | Freq: Two times a day (BID) | ORAL | Status: DC
  Administered 2022-08-04: 630 mg via ORAL
  Filled 2022-08-04 (×2): qty 3

## 2022-08-04 NOTE — Progress Notes (Signed)
   08/04/22 1200  Vitals  Pulse Rate 79  Resp 17  SpO2 100 %  Post Treatment  Dialyzer Clearance Lightly streaked  Duration of HD Treatment -hour(s) 3.5 hour(s)  Hemodialysis Intake (mL) 100 mL  Liters Processed 75.7  Fluid Removed (mL) 600 mL  Tolerated HD Treatment Yes  AVG/AVF Arterial Site Held (minutes) 10 minutes  AVG/AVF Venous Site Held (minutes) 10 minutes   Received patient in bed to unit.  Alert and oriented.  Informed consent signed and in chart.   Ismay duration:3.5  Patient tolerated well.  Transported back to the room  Alert, without acute distress.  Hand-off given to patient's nurse.   Access used: larm access Access issues: no complications  Total UF removed: 600 Medication(s) given: none   [facility]

## 2022-08-04 NOTE — TOC Initial Note (Signed)
Transition of Care [facility]) - Initial/Assessment Note    Patient Details  Name: Robin [name] MRN: [id number] Date of Birth: 01-14-1951  Transition of [name]) CM/SW Contact:    Milas Gain, [place] Phone Number: 08/04/2022, 2:19 PM  Clinical Narrative:                  CSW received consult for possible SNF placement at time of discharge. CSW spoke with patient at bedside regarding PT recommendation of SNF placement at time of discharge. Patient reports PTA she comes from home alone. Patient reports her she has a Psychologist, counselling who comes and works with her 2 1/2 hours a day. Patient expressed understanding of PT recommendation and declined SNF placement at time of discharge.Patient reports she will have supervision from her daughter Robin [name] when she returns home.   CSW called patients daughter Robin [name] who verified she can provide supervision for patient when she returns home. Patients daughter informed CSW she already receives services through the [place] that she would need [facility] PT/OT services. CSW informed CM.No further questions reported at this time. CSW to continue to follow and assist with discharge planning needs.    Expected Discharge Plan: [place] Barriers to Discharge: Continued Medical Work up   Patient Goals and CMS Choice Patient states their goals for this hospitalization and ongoing recovery are:: to return back home   Choice offered to / list presented to : Patient      Expected Discharge Plan and Services In-house Referral: Clinical Social Work     Living arrangements for the past 2 months: Single Family Home                                      Prior Living Arrangements/Services Living arrangements for the past 2 months: Single Family Home Lives with:: Self Patient language and need for interpreter reviewed:: Yes Do you feel safe going back to the place where you live?: Yes      Need for Family Participation in Patient Care: Yes  (Comment) Care giver support system in place?: Yes (comment)   Criminal Activity/Legal Involvement Pertinent to Current Situation/Hospitalization: No - Comment as needed  Activities of Daily Living Home Assistive Devices/Equipment: Cane (specify quad or straight), Dentures (specify type), Eyeglasses, Grab bars in shower, Hand-held shower hose, Shower chair with back, Environmental consultant (specify type) ADL Screening (condition at time of admission) Patient's cognitive ability adequate to safely complete daily activities?: Yes Is the patient deaf or have difficulty hearing?: No Does the patient have difficulty seeing, even when wearing glasses/contacts?: Yes Does the patient have difficulty concentrating, remembering, or making decisions?: Yes Patient able to express need for assistance with ADLs?: Yes Does the patient have difficulty dressing or bathing?: No Independently performs ADLs?: Yes (appropriate for developmental age) Does the patient have difficulty walking or climbing stairs?: Yes Weakness of Legs: Both Weakness of Arms/Hands: None  Permission Sought/Granted Permission sought to share information with : Case Manager, Family Supports, Customer service manager Permission granted to share information with : Yes, Verbal Permission Granted  Share Information with NAME: Robin [name]     Permission granted to share info w Relationship: Daughter  Permission granted to share info w Contact Information: Robin [name] [phone]  Emotional Assessment Appearance:: Appears stated age Attitude/Demeanor/Rapport: Gracious Affect (typically observed): Calm Orientation: : Oriented to Self, Oriented to Place, Oriented to  Time, Oriented to Situation Alcohol /  Substance Use: Not Applicable Psych Involvement: No (comment)  Admission diagnosis:  Elevated troponin [R79.89] Chest pain [R07.9] Patient Active Problem List   Diagnosis Date Noted   Chest tightness 07/30/2022   NSTEMI (non-ST elevated myocardial  infarction) ([place]) 07/29/2022   PAC (premature atrial contraction) 07/29/2022   Hyperlipidemia with target LDL less than 70 07/29/2022   AMS (altered mental status) 07/27/2020   COVID-19 01/10/2020   Hypoxia 01/10/2020   Chest pain 10/01/2019   History of pulmonary embolism 10/01/2019   Prolonged QT interval 10/01/2019   Hypertensive urgency    Other headache syndrome    Orthostatic dizziness 04/15/2019   Bradycardia 04/15/2019   Syncope    ESRD (end stage renal disease) ([place])    Chest pain at rest 09/16/2018   Diabetic Charct's arthropathy ([place]) 10/07/2016   GERD (gastroesophageal reflux disease) 09/09/2016   Depression 09/09/2016   Chronic diastolic (congestive) heart failure ([place]) 09/09/2016   Elevated troponin 09/09/2016   Hyperlipidemia associated with type 2 diabetes mellitus ([place]) 03/26/2015   Charcot ankle 03/16/2015   Anemia, chronic renal failure    NSVT (nonsustained ventricular tachycardia) ([place])    Hypertension due to end stage renal disease caused by type 2 diabetes mellitus, on dialysis ([place]) 12/09/2014   Bipolar affective disorder ([place]) 12/09/2014   Diabetes mellitus type 2, insulin dependent ([place]) 11/17/2014   CAD (coronary artery disease), native coronary artery with 2 stents  11/17/2014   PCP:  [facility], [place]:   [id number] [place] [place]Phone: [phone]: [address] [address]Phone: [phone]: [phone]    Social Determinants of Health ([place] Social History: SDOH Screenings   Food Insecurity: No Food Insecurity (07/29/2022)  Housing: Low Risk  (07/29/2022)  Transportation Needs: No Transportation Needs (07/29/2022)  Utilities: Not At Risk (07/29/2022)  Tobacco Use: High Risk (08/01/2022)   SDOH Interventions: Food Insecurity Interventions: Intervention Not  Indicated Housing Interventions: Intervention Not Indicated Transportation Interventions: Intervention Not Indicated Utilities Interventions: Intervention Not Indicated   Readmission Risk Interventions    01/23/2020   11:10 AM  Readmission Risk Prevention Plan  Transportation Screening Complete  Medication Review (Press photographer Referral to Pharmacy  PCP or Specialist appointment within 3-5 days of discharge Complete  HRI or Home Care Consult Complete  SW Recovery Care/Counseling Consult Complete  Palliative Care Screening Not Applicable  Skilled Nursing Facility Complete

## 2022-08-04 NOTE — Progress Notes (Addendum)
[facility] Progress Note   Subjective:   Patient seen and examined at bedside in dialysis.  Mostly tolerating well so far.  Complains of back pain, shortness of breath and epigastric pain.  SOB improving with HD.  Denies n/v/d, dizziness and fatigue.   Objective Vitals:   08/04/22 1000 08/04/22 1030 08/04/22 1100 08/04/22 1105  BP: 99/69 (!) 90/49 (!) 95/38 (!) 95/38  Pulse: 80 80 79 80  Resp: '14 15 12 15  '$ Temp:      TempSrc:      SpO2: 97% 98% 95% 97%  Weight:      Height:       Physical Exam General:alert, elderly female in NAD Heart:RRR,  Lungs:CTAB, nml WOB on RA Abdomen:soft, +epigastric tenderness Extremities:no LE edema Dialysis Access: LU AVF in use   Filed Weights   08/01/22 1326 08/04/22 0639 08/04/22 0753  Weight: 83.2 kg 89.2 kg 88.7 kg    Intake/Output Summary (Last 24 hours) at 08/04/2022 1122 Last data filed at 08/03/2022 2200 Gross per 24 hour  Intake 243 ml  Output --  Net 243 ml    Additional Objective Labs: Basic Metabolic Panel: Recent Labs  Lab 07/29/22 1213 07/30/22 0559 07/30/22 1845 08/01/22 0302 08/02/22 0739  NA  --  137 135 136 135  K  --  3.6 3.8 4.9 3.8  CL  --  96* 93* 94* 96*  CO2  --  '22 28 26 24  '$ GLUCOSE  --  48* 209* 204* 273*  BUN  --  40* 17 39* 31*  CREATININE  --  5.54* 3.14* 5.44* 4.16*  CALCIUM  --  9.3 9.4 9.3 9.1  PHOS 4.9* 6.0*  --  5.9*  --    Liver Function Tests: Recent Labs  Lab 07/29/22 0443 07/30/22 0559 08/01/22 0302  AST 31  --   --   ALT 19  --   --   ALKPHOS 210*  --   --   BILITOT 0.7  --   --   PROT 7.0  --   --   ALBUMIN 3.1* 3.0* 3.1*   Recent Labs  Lab 07/29/22 0443  LIPASE 52*   CBC: Recent Labs  Lab 07/29/22 0443 07/30/22 0559 07/31/22 0944 08/01/22 0302 08/02/22 0621  WBC 6.0 4.3 4.5 4.9 5.9  NEUTROABS 4.0  --   --   --   --   HGB 11.9* 11.6* 12.6 11.8* 12.4  HCT 37.5 37.5 40.2 37.2 39.4  MCV 94.9 94.2 94.8 94.2 94.7  PLT 184 171 194 162 120*    CBG: Recent Labs  Lab 08/03/22 0804 08/03/22 1202 08/03/22 1607 08/03/22 2207 08/04/22 0110  GLUCAP 259* 281* 327* 374* 441*   Medications:  sodium chloride     ferric gluconate (FERRLECIT) IVPB 125 mg (08/01/22 1626)    acetaminophen  1,000 mg Oral Q6H   apixaban  2.5 mg Oral BID   ARIPiprazole  12 mg Oral QHS   atorvastatin  80 mg Oral QHS   Chlorhexidine Gluconate Cloth  6 each Topical Q0600   clopidogrel  75 mg Oral Daily   diclofenac Sodium  4 g Topical QID   feeding supplement (NEPRO CARB STEADY)  237 mL Oral BID BM   ferric citrate  420 mg Oral BID WC   gabapentin  100 mg Oral BID   insulin aspart  0-15 Units Subcutaneous TID WC   insulin detemir  15 Units Subcutaneous QHS   isosorbide mononitrate  30 mg  Oral Q breakfast   lidocaine  1 patch Transdermal Q24H   metoprolol succinate  25 mg Oral Daily   multivitamin  1 tablet Oral QHS   pantoprazole  40 mg Oral BID AC   sodium chloride flush  3 mL Intravenous Q12H   sodium chloride flush  3 mL Intravenous Q12H   umeclidinium bromide  2 puff Inhalation q morning    Dialysis Orders: [place] MWF  3.5h  400/1.5  59kg  2/2 bath  AVF  Hep none  - last HD 3/4, post wt 59.2kg - venofer '100mg'$  tiw thru 3/18 - hectorol 1 mcg IV tiw - korsuva 0.6 IV tiw - mircera 50 mcg IV q 2 wks, last 2/26, due 3/11   Assessment/Plan: NSTEMI - IV heparin, seen by cardiology, per cards/ pmd. S/p cardiac cath 3/7-per cardiology, medical management is recommended - on Plavix '75mg'$  qd, eliquis 2.'5mg'$  BID, imdur '30mg'$  qd, atorvastatin '80mg'$  qd and Toprol '25mg'$  qd.   ESRD - on HD MWF. HD today per regular schedule.  HTN: BP in goal. On Toprol '25mg'$  qd. Home lisinopril on hold.  volume - Weights are inaccurate - 63kg on admit with 59kg dry weight now with weights of 84-89kg recorded. Does not appear volume overloaded. Net UF 3 liters with HD 07/30/2022. UF 2.5L with HD yesterday. Noted patient had episode of hypotension and difficult to arouse on 3/9.  She then returned to baseline after NS bolus and sternal rubs. Last CXR was negative for pulmonary edema and she remains euvolemic on exam. No issues noted overnight. UF as tolerated.  Anemia esrd - Hb 12.4, no esa indicated. MBD ckd - CCa in range and Phos mildly elevated. Cont IV vdra and auryxia 3AC TID.  Atrial fib - per pmd PAD - hx foot/ toe amps H/o DVT/ pulm embolism Hx dementia/ bipolar/ schizoaffective d/o  Dispo - SNF recommended  Jen Mow, PA-C [facility] 08/04/2022,11:22 AM  LOS: 5 days   Seen and examined independently.  Agree with note and exam as documented above by physician extender and as noted here.  See my procedure note from today.  Reported EDW was correct - bed weights here are not accurate.  Her labs are still pending   Claudia Desanctis, MD 08/04/2022  1:49 PM

## 2022-08-04 NOTE — Procedures (Signed)
Seen and examined on dialysis.  Procedure supervised.  Blood pressure 111/57 and HR 83.  LUE AVF in use. Tolerating goal.    Weights are not accurate - will check her dry weight.  Charted as 63 kg on admit and now 88.7 kg with reported EDW of 59 kg and last post weight of 59.2 kg.  Claudia Desanctis, MD 08/04/2022 9:08 AM

## 2022-08-04 NOTE — Progress Notes (Signed)
Late Entry: HS CBG 374.  Long acting insulin given per orders.  Rechecked CBG = 441; MD notified with new orders.  Insulin administered per orders.  Daughter at bedside.  Will continue to monitor.

## 2022-08-04 NOTE — Progress Notes (Signed)
HD#5 SUBJECTIVE:  Patient Summary: Robin [name] is a 72 y.o. 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     Overnight Events: Glucose was 441 at 0110 last night. 8 units of Novolog given. Patient was in no acute distress.  Seen at HD this morning. Patient still has back pain and very little chest pressure unchanged from previous. No new concerns.   OBJECTIVE:  Vital Signs: Vitals:   08/04/22 1030 08/04/22 1100 08/04/22 1105 08/04/22 1130  BP: (!) 90/49 (!) 95/38 (!) 95/38 102/88  Pulse: 80 79 80 81  Resp: '15 12 15 17  '$ Temp:      TempSrc:      SpO2: 98% 95% 97% 99%  Weight:      Height:       Supplemental O2: Room Air SpO2: 99 % O2 Flow Rate (L/min): 2 L/min  Filed Weights   08/01/22 1326 08/04/22 0639 08/04/22 0753  Weight: 83.2 kg 89.2 kg 88.7 kg     Intake/Output Summary (Last 24 hours) at 08/04/2022 1205 Last data filed at 08/03/2022 2200 Gross per 24 hour  Intake 243 ml  Output --  Net 243 ml   Net IO Since Admission: -3,177.65 mL [08/04/22 1205]  Physical Exam: Physical Exam Constitutional:      General: She is not in acute distress. HENT:     Head: Normocephalic and atraumatic.     Mouth/Throat:     Mouth: Mucous membranes are moist.  Cardiovascular:     Rate and Rhythm: Normal rate and regular rhythm.  Pulmonary:     Effort: Pulmonary effort is normal. No respiratory distress.     Breath sounds: Normal breath sounds.  Abdominal:     General: Bowel sounds are normal. There is no distension.     Palpations: Abdomen is soft.     Tenderness: There is no abdominal tenderness.  Musculoskeletal:        General: No swelling. Normal range of motion.     Comments: Tenderness at midline of the thoracic region of the back    Skin:    General: Skin is warm and dry.  Neurological:     General: No focal deficit present.     Mental Status: She is alert and oriented to person, place, and time.  Psychiatric:        Mood and Affect: Mood normal.        Behavior: Behavior normal.     Patient Lines/Drains/Airways Status     Active Line/Drains/Airways     Name Placement date Placement time Site Days   Peripheral IV 07/31/22 20 G 2.5" Right Antecubital 07/31/22  1001  Antecubital  4   Fistula / Graft Left Upper arm Arteriovenous fistula 09/30/14  --  Upper arm  2865   Fistula / Graft Left Upper arm Arteriovenous fistula --  --  Upper arm  --   Fistula / Graft Left Upper arm --  --  Upper arm  --   Incision (Closed) 03/16/15 Leg Left 03/16/15  1346  -- 2698   Incision (Closed) 08/06/16 Foot Right 08/06/16  0915  -- 2189   Incision (Closed) 09/09/16 Foot Right 09/09/16  0924  -- 2155            Pertinent Labs:    Latest  Ref Rng & Units 08/02/2022    6:21 AM 08/01/2022    3:02 AM 07/31/2022    9:44 AM  CBC  WBC 4.0 - 10.5 K/uL 5.9  4.9  4.5   Hemoglobin 12.0 - 15.0 g/dL 12.4  11.8  12.6   Hematocrit 36.0 - 46.0 % 39.4  37.2  40.2   Platelets 150 - 400 K/uL 120  162  194        Latest Ref Rng & Units 08/02/2022    7:39 AM 08/01/2022    3:02 AM 07/30/2022    6:45 PM  CMP  Glucose 70 - 99 mg/dL 273  204  209   BUN 8 - 23 mg/dL 31  39  17   Creatinine 0.44 - 1.00 mg/dL 4.16  5.44  3.14   Sodium 135 - 145 mmol/L 135  136  135   Potassium 3.5 - 5.1 mmol/L 3.8  4.9  3.8   Chloride 98 - 111 mmol/L 96  94  93   CO2 22 - 32 mmol/L '24  26  28   '$ Calcium 8.9 - 10.3 mg/dL 9.1  9.3  9.4     Recent Labs    08/03/22 1607 08/03/22 2207 08/04/22 0110  GLUCAP 327* 374* 441*     Pertinent Imaging: No results found.  ASSESSMENT/PLAN:  Assessment: Principal Problem:   NSTEMI (non-ST elevated myocardial infarction) ([place]) Active Problems:   Chest pain   PAC (premature atrial contraction)   Hyperlipidemia with  target LDL less than 70   Chest tightness   Robin [name] is a 72 y.o. 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   Plan: #NSTEMI #Hx of CAD s/p PCI Presented with week of chest pressure that radiates to left neck. Mentioned minor chest pressure today but it does not bother her as much as her back pain that appears to be chronic. Left heart cath done on 3/7, recommend continue medical therapy. Amlodipine held in setting of hypotension yesterday. Of note, family reported amlodipine causes prolonged hypotension for patient. BP this morning 110s/60-70 and no overnight hypotension documented. PT and OT recommend SNF. Discussed with patient and daughter about this and they are agreeable with plan. Contacted SW regarding placement. Per guidelines for reduced dose of Eliquis, patient no longer meets 2 or the 3 criteria necessary for 2.'5mg'$ . Can consider changing this to '5mg'$  after discharge.  -telemetry  -continue Plavix 75 mg daily -continue Eliquis 2.5 mg BID, consider increasing to '5mg'$  at discharge  -continue Imdur 30 mg daily -continue metoprolol succinate 25 mg daily  -continue atorvastatin 80 daily -nitro PRN   #Hx of combined systolic and diastolic HF #Hx of atrial fibrillation on anticoagulation #Hx of PE and DVT on anticoagulation Echo showed EF 45-50% with global hypokinesis of LV and G2DD. GDMT limited given ESRD. Does not appear volume overloaded today. Telemetry shows sinus rhythm.  -continue metoprolol succinate 25 mg daily -continue Eliquis 2.5 mg BID   #ESRD on HD (MWF) Nephrology following. Getting HD this morning as per schedule.  -appreciate nephrology assistance -continue ferric citrate 520 mg BID   #Type 2 DM A1c 7% in December. CBGs have been above  goal. At home reported to be on levemir 15 units.   -CBG monitoring -adjusted SSI to moderate -increase levemir to home  dose to 18 units QHS   #PAD Has hx of bilateral LE arterial disease s/p LLE SFA stent in 2023. Noted hospitalization last year for infectious/ischemic disease requiring right TMA and left 1st toe amputation.  -continue atorvastatin 40 mg daily -continue Plavix 75 mg daily   #HTN #HLD Holding home lisinopril 2.5 mg now. Had overnight hypotension on 3/8. BP has been 110s/60s. Patient on atorvastatin 40 mg at home.  -hold lisinopril -continue home atorvastatin    #GERD -continue home Protonix 40 mg BID   #Schizoaffective & bipolar disorder #Dementia Follows w/ [facility] for mental health. On Abilify 12 mg daily -continue home Abilify -continue f/u with [facility]    #Back Pain Still has thoracolumbar back pain but patient states not worsening. Suspect likely MSK in nature given reproducibility on previous exams. Imaging unrevealing of acute process.  -continue scheduled tylenol 1000 mg q6h -lidocaine patch  -voltaren gel    Best Practice: Diet: Regular IVF: Fluids: none VTE: Eliquis Code: Full Therapy Recs:  PT & OT - SNF Family Contact: daughter DISPO: Anticipated discharge pending medical stability and SNF placement   Signature: Marisa Cyphers, Medical Student   Please contact the on call pager after 5 pm and on weekends at [phone].   Attestation for Student Documentation:  I personally was present and performed or re-performed the history, physical exam and medical decision-making activities of this service and have verified that the service and findings are accurately documented in the student's note.  Angelique Blonder, DO 08/04/2022, 1:10 PM

## 2022-08-04 NOTE — TOC Progression Note (Signed)
Transition of Care [facility]) - Progression Note    Patient Details  Name: Robin [name] MRN: [id number] Date of Birth: 1950/12/17  Transition of Care [name]) CM/SW Contact  Graves-Bigelow, Ocie Cornfield, RN Phone Number: 08/04/2022, 2:29 PM  Clinical Narrative: Case Manager received a message that the patient has declined SNF and daughter is agreeable with the plan to return home. Patient has caregivers via [place] arranged. Judson Roch is agreeable to home health physical and occupational therapy via [facility]. [place] [facility] has to get authorization from the [place] before the patient can be seen. No DME needs identified at this time. Case Manager will continue to follow for transition of care needs.    Expected Discharge Plan: [place] Barriers to Discharge: No Barriers Identified  Expected Discharge Plan and Services In-house Referral: Clinical Social Work Discharge Planning Services: CM Consult Post Acute Care Choice: [place] arrangements for the past 2 months: Single Family Home                   DME Agency: NA       HH Arranged: PT, OT HH Agency: [place] Date [place]: 08/04/22 Time [place]: 1429 Representative spoke with at [place]: [place] Determinants of Health ([place]) Interventions [place]: No Food Insecurity (07/29/2022)  Housing: Low Risk  (07/29/2022)  Transportation Needs: No Transportation Needs (07/29/2022)  Utilities: Not At Risk (07/29/2022)  Tobacco Use: High Risk (08/01/2022)   Readmission Risk Interventions    01/23/2020   11:10 AM  Readmission Risk Prevention Plan  Transportation Screening Complete  Medication Review (RN Care Manager) Referral to Pharmacy  PCP or Specialist appointment within 3-5 days of discharge Complete  HRI or [place] Complete  SW Recovery Care/Counseling Consult Complete  Palliative Care Screening Not [place] Complete

## 2022-08-05 DIAGNOSIS — I5032 Chronic diastolic (congestive) heart failure: Secondary | ICD-10-CM

## 2022-08-05 DIAGNOSIS — I132 Hypertensive heart and chronic kidney disease with heart failure and with stage 5 chronic kidney disease, or end stage renal disease: Secondary | ICD-10-CM

## 2022-08-05 DIAGNOSIS — E119 Type 2 diabetes mellitus without complications: Secondary | ICD-10-CM

## 2022-08-05 DIAGNOSIS — E440 Moderate protein-calorie malnutrition: Secondary | ICD-10-CM | POA: Diagnosis present

## 2022-08-05 LAB — BASIC METABOLIC PANEL
Anion gap: 10 (ref 5–15)
BUN: 28 mg/dL — ABNORMAL HIGH (ref 8–23)
CO2: 27 mmol/L (ref 22–32)
Calcium: 9.2 mg/dL (ref 8.9–10.3)
Chloride: 97 mmol/L — ABNORMAL LOW (ref 98–111)
Creatinine, Ser: 3.82 mg/dL — ABNORMAL HIGH (ref 0.44–1.00)
GFR, Estimated: 12 mL/min — ABNORMAL LOW (ref >60)
Glucose, Bld: 200 mg/dL — ABNORMAL HIGH (ref 70–99)
Potassium: 3.7 mmol/L (ref 3.5–5.1)
Sodium: 134 mmol/L — ABNORMAL LOW (ref 135–145)

## 2022-08-05 LAB — CBC
HCT: 29.7 % — ABNORMAL LOW (ref 36.0–46.0)
Hemoglobin: 9.6 g/dL — ABNORMAL LOW (ref 12.0–15.0)
MCH: 30.6 pg (ref 26.0–34.0)
MCHC: 32.3 g/dL (ref 30.0–36.0)
MCV: 94.6 fL (ref 80.0–100.0)
Platelets: 133 10*3/uL — ABNORMAL LOW (ref 150–400)
RBC: 3.14 MIL/uL — ABNORMAL LOW (ref 3.87–5.11)
RDW: 19.7 % — ABNORMAL HIGH (ref 11.5–15.5)
WBC: 5.2 10*3/uL (ref 4.0–10.5)
nRBC: 0 % (ref 0.0–0.2)

## 2022-08-05 LAB — GLUCOSE, CAPILLARY
Glucose-Capillary: 111 mg/dL — ABNORMAL HIGH (ref 70–99)
Glucose-Capillary: 306 mg/dL — ABNORMAL HIGH (ref 70–99)
Glucose-Capillary: 282 mg/dL — ABNORMAL HIGH (ref 70–99)

## 2022-08-05 LAB — PHOSPHORUS: Phosphorus: 2.2 mg/dL — ABNORMAL LOW (ref 2.5–4.6)

## 2022-08-05 MED ORDER — DARBEPOETIN ALFA 60 MCG/0.3ML IJ SOSY: 60.0000 ug | PREFILLED_SYRINGE | INTRAMUSCULAR | Status: DC

## 2022-08-05 MED ORDER — DICLOFENAC SODIUM 1 % EX GEL: 4.0000 g | Freq: Four times a day (QID) | CUTANEOUS | 0 refills | Status: DC | PRN

## 2022-08-05 MED ORDER — METOPROLOL SUCCINATE ER 25 MG PO TB24: 25.0000 mg | ORAL_TABLET | Freq: Every day | ORAL | 0 refills | Status: DC

## 2022-08-05 MED ORDER — CLOPIDOGREL BISULFATE 75 MG PO TABS: 75.0000 mg | ORAL_TABLET | Freq: Every day | ORAL | 0 refills | Status: DC

## 2022-08-05 MED ORDER — LEVEMIR FLEXTOUCH 100 UNIT/ML ~~LOC~~ SOPN: 18.0000 [IU] | PEN_INJECTOR | Freq: Every day | SUBCUTANEOUS | 0 refills | Status: DC

## 2022-08-05 MED ORDER — CHLORHEXIDINE GLUCONATE CLOTH 2 % EX PADS
6.0000 | MEDICATED_PAD | Freq: Every day | CUTANEOUS | Status: DC
  Administered 2022-08-05: 6 via TOPICAL

## 2022-08-05 MED ORDER — ATORVASTATIN CALCIUM 80 MG PO TABS: 80.0000 mg | ORAL_TABLET | Freq: Every day | ORAL | 0 refills | Status: DC

## 2022-08-05 MED ORDER — FERRIC CITRATE 1 GM 210 MG(FE) PO TABS
420.0000 mg | ORAL_TABLET | Freq: Two times a day (BID) | ORAL | Status: DC
  Administered 2022-08-05: 420 mg via ORAL
  Filled 2022-08-05 (×2): qty 2

## 2022-08-05 MED ORDER — ARIPIPRAZOLE 2 MG PO TABS: 12.0000 mg | ORAL_TABLET | Freq: Every day | ORAL | 0 refills | Status: DC

## 2022-08-05 MED ORDER — ISOSORBIDE MONONITRATE ER 30 MG PO TB24: 30.0000 mg | ORAL_TABLET | Freq: Every day | ORAL | 0 refills | Status: DC

## 2022-08-05 NOTE — Progress Notes (Signed)
Nutrition Follow-up  DOCUMENTATION CODES:   Non-severe (moderate) malnutrition in context of chronic illness  INTERVENTION:  Continue liberalized, regular diet to encourage oral intake. Will update diet order so pt receives assistance with ordering meals.   Continue Nepro Shake po BID, each supplement provides 425 kcal and 19 grams protein.  Continue Rena-vite po QHS.  NUTRITION DIAGNOSIS:   Moderate Malnutrition related to chronic illness (ESRD on HD, COPD, HF) as evidenced by mild fat depletion, mild muscle depletion, moderate muscle depletion.  Updated nutrition diagnosis.  GOAL:   Patient will meet greater than or equal to 90% of their needs  Progressing with interventions.  MONITOR:   PO intake, Supplement acceptance, Labs, Weight trends, I & O's  REASON FOR ASSESSMENT:   Malnutrition Screening Tool    ASSESSMENT:   72 year old female with PMHx of ESRD on HD, paroxymal A-fib, PE/DVT, CAD s/p PCA, lower extremity necrotizing infections s/p right TMA amputation and left 1st toe amputation, combined systolic and diastolic heart failure, dementia, bipolar, COPD, T2DM, HTN, HLD admitted with ongoing chest pressure x 1 week.  3/7: s/p cath that showed patent ramus stent with minimal ISR, moderate proximal to mid LAD lesions with RFR 0.95, moderate ostial left Cx lesion with RFR 1.0, moderate mid RCA with an RFR 0.92. Medical therapy was recommended.   3/10: diet liberalized to regular  Met with pt at bedside. She reports her appetite has been good. She reports prior to diet being liberalized she was having difficulty getting enough food. She reports she is eating 100% of her meals here since diet was liberalized and drinking Nepro supplements as ordered. Limited meal documentation available in chart but documented to have eaten 100% of dinner on 3/9 and 3/10. Pt reports she has a good appetite and intake at baseline. She reports eating 3 meals daily at baseline. For breakfast  she has grits or oatmeal with eggs and bacon. For lunch she has chicken. For dinner she has chicken with greens. She reports intake does not differ from HD and non-HD days as she always tries to have 3 meals per day. She denies food allergies or intolerances. Endorses some nausea but reports it is not impacting intake. Denies emesis or abdominal pain. Also denies any difficulty with chewing/swallowing. Encouraged adequate intake of protein at meals. Pt is agreeable to continue Nepro supplements.   Weights vary in chart. Per Nephrology notes weights this admission (84-89 kg) are inaccurate. Per Nephrology note dry wt is 59 kg.  UOP: none documented previous 24 hrs  I/O: -3777.7 mL since admission  Medications reviewed and include: Nepro BID, ferric citrate 420 mg BID with meals, gabapentin, Novolog 0-15 units TID with meals, Levemir 18 units daily at bedtime, Rena-vite QHS, pantoprazole, ferric gluconate 125 mg every M/W/F with HD  Labs reviewed: CBG 111-441, Sodium 134, Chloride 97, BUN 28. Potassium WNL, Phosphorus 2.2  NUTRITION - FOCUSED PHYSICAL EXAM:  Flowsheet Row Most Recent Value  Orbital Region Mild depletion  Upper Arm Region Moderate depletion  Thoracic and Lumbar Region No depletion  Buccal Region Mild depletion  Temple Region Mild depletion  Clavicle Bone Region Mild depletion  Clavicle and Acromion Bone Region Moderate depletion  Scapular Bone Region Mild depletion  Dorsal Hand Moderate depletion  Patellar Region Severe depletion  Anterior Thigh Region Moderate depletion  Posterior Calf Region Severe depletion  Edema (RD Assessment) Mild  Hair Reviewed  Eyes Reviewed  Mouth Reviewed  Skin Reviewed  Nails Reviewed  Pt is s/p right TMA amputation and left 1st toe amputation  Diet Order:   Diet Order             Diet regular Room service appropriate? Yes; Fluid consistency: Thin  Diet effective now                  EDUCATION NEEDS:   Education needs  have been addressed (Encouraged adequate intake of calories and protein. Encouraged pt to choose a good source of protein at meals.)  Skin:  Skin Assessment: Reviewed RN Assessment  Last BM:  08/01/22 - type 4  Height:   Ht Readings from Last 1 Encounters:  07/29/22 '5\' 4"'$  (1.626 m)   Weight:   Wt Readings from Last 1 Encounters:  08/04/22 88.7 kg   BMI:  Body mass index is 33.57 kg/m.  Estimated Nutritional Needs:   Kcal:  [id number]  Protein:  85-95 grams  Fluid:  UOP + 1 L  Marceline Napierala Magda Paganini, MS, RD, LDN, CNSC Pager number available on Amion

## 2022-08-05 NOTE — Progress Notes (Signed)
Physical Therapy Treatment Patient Details Name: Robin [name] MRN: [id number] DOB: Oct 31, 1950 Today's Date: 08/05/2022   History of Present Illness 72 yo female with onset of chest pain and cardiac event was admitted 3/5 with NSTEMI and had low BP readings.  Heart cath 3/8, EF 45-50%, global heart mm hypokinesis.  PMHx:  CAD, PCI, RLE amp with necrotizing ischemic infections, ESRD, dementia, a-fib, CHF, COPD, DM, HTN, HLD,    PT Comments    Pt tolerated treatment well today. Pt was able to transfer from bed to [facility] then to chair with +2 Min A with RW. Pt reported dizziness throughout session and BP was running high today at 184/91 seated EOB, 152/99 standing, and 166/88 seated in chair.  RN made aware. No change in DC/DME recs. Pt would benefit from continued functional mobility training during acute stay to maximize independence given that pt lives alone.  Recommendations for follow up therapy are one component of a multi-disciplinary discharge planning process, led by the attending physician.  Recommendations may be updated based on patient status, additional functional criteria and insurance authorization.  Follow Up Recommendations  Skilled nursing-short term rehab (<3 hours/day) Can patient physically be transported by private vehicle: No   Assistance Recommended at Discharge Frequent or constant Supervision/Assistance  Patient can return home with the following Two people to help with walking and/or transfers;A lot of help with bathing/dressing/bathroom;Assistance with cooking/housework;Direct supervision/assist for medications management;Direct supervision/assist for financial management;Assist for transportation;Help with stairs or ramp for entrance   Equipment Recommendations  None recommended by PT    Recommendations for Other Services       Precautions / Restrictions Precautions Precautions: Fall Precaution Comments: monitor BP and HR Restrictions Weight Bearing Restrictions:  No     Mobility  Bed Mobility Overal bed mobility: Needs Assistance Bed Mobility: Supine to Sit     Supine to sit: Supervision, HOB elevated          Transfers Overall transfer level: Needs assistance Equipment used: Rolling walker (2 wheels) Transfers: Bed to chair/wheelchair/BSC, Sit to/from Stand Sit to Stand: +2 physical assistance, Min assist   Step pivot transfers: +2 physical assistance, Min assist            Ambulation/Gait Ambulation/Gait assistance: +2 physical assistance, Min assist Gait Distance (Feet): 5 Feet Assistive device: Rolling walker (2 wheels) Gait Pattern/deviations: Step-through pattern, Decreased stride length, Knee flexed in stance - left, Knee flexed in stance - right, Drifts right/left, Wide base of support Gait velocity: decreased     General Gait Details: From [facility] to chair   Stairs             Wheelchair Mobility    Modified Rankin (Stroke Patients Only)       Balance Overall balance assessment: Needs assistance Sitting-balance support: Feet supported Sitting balance-Leahy Scale: Fair     Standing balance support: Bilateral upper extremity supported, During functional activity Standing balance-Leahy Scale: Poor Standing balance comment: direct support needed to balance in standing at walker                            Cognition Arousal/Alertness: Awake/alert Behavior During Therapy: WFL for tasks assessed/performed Overall Cognitive Status: History of cognitive impairments - at baseline  Exercises      General Comments General comments (skin integrity, edema, etc.): BP: 184/91 seated EOB, 152/99 standing, and 166/88 seated in chair. RN made aware      Pertinent Vitals/Pain Pain Assessment Pain Assessment: No/denies pain    Home Living                          Prior Function            PT Goals (current goals can now be  found in the care plan section) Progress towards PT goals: Progressing toward goals    Frequency    Min 2X/week      PT Plan Current plan remains appropriate    Co-evaluation              AM-PAC PT "6 Clicks" Mobility   Outcome Measure  Help needed turning from your back to your side while in a flat bed without using bedrails?: A Little Help needed moving from lying on your back to sitting on the side of a flat bed without using bedrails?: A Little Help needed moving to and from a bed to a chair (including a wheelchair)?: A Lot Help needed standing up from a chair using your arms (e.g., wheelchair or bedside chair)?: A Lot Help needed to walk in hospital room?: A Lot Help needed climbing 3-5 steps with a railing? : Total 6 Click Score: 13    End of Session Equipment Utilized During Treatment: Gait belt Activity Tolerance: Patient tolerated treatment well Patient left: in chair;with call bell/phone within reach;with chair alarm set Nurse Communication: Mobility status;Other (comment) (BP readings) PT Visit Diagnosis: Unsteadiness on feet (R26.81);Muscle weakness (generalized) (M62.81);Other abnormalities of gait and mobility (R26.89)     Time: [id number] PT Time Calculation (min) (ACUTE ONLY): 22 min  Charges:  $Therapeutic Activity: 8-22 mins                     Shelby Mattocks, PT, DPT Acute Rehab Services [id number]    Robin [name] 08/05/2022, 11:59 AM

## 2022-08-05 NOTE — Progress Notes (Incomplete)
HD#6 SUBJECTIVE:  Patient Summary: Robin [name] is a 72 y.o. 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  Overnight Events: NAEON. Patient was able to work with PT and sat on the bedside commode twice, but reports some dizziness with standing. Patient still has chronic back pain but reports good appetite and denies any shortness of breath or new/worsening chest pain. No new concerns.     OBJECTIVE:  Vital Signs: Vitals:   08/04/22 2004 08/05/22 0415 08/05/22 0754 08/05/22 1209  BP: (!) 154/83 128/71 (!) 148/79 (!) 155/92  Pulse: 81 72 82   Resp: 18 18    Temp: 97.9 F (36.6 C) 98 F (36.7 C) 97.6 F (36.4 C)   TempSrc: Axillary Axillary Oral   SpO2: 97% 98% 100%   Weight:      Height:       Supplemental O2: Room Air SpO2: 100 % O2 Flow Rate (L/min): 2 L/min  Filed Weights   08/01/22 1326 08/04/22 0639 08/04/22 0753  Weight: 83.2 kg 89.2 kg 88.7 kg    No intake or output data in the 24 hours ending 08/05/22 1332 Net IO Since Admission: -3,777.65 mL [08/05/22 1332]  Physical Exam: Physical Exam Constitutional:      General: She is not in acute distress. HENT:     Head: Normocephalic and atraumatic.     Mouth/Throat:     Mouth: Mucous membranes are moist.  Cardiovascular:     Rate and Rhythm: Normal rate and regular rhythm.  Pulmonary:     Effort: Pulmonary effort is normal. No respiratory distress.     Breath sounds: Normal breath sounds.  Abdominal:     General: Bowel sounds are normal. There is no distension.     Palpations: Abdomen is soft.     Tenderness: There is no abdominal tenderness.  Musculoskeletal:        General: No swelling. Normal range of motion.     Comments: Tenderness at midline of the thoracic region of the back    Skin:    General: Skin is warm and dry.  Neurological:     General: No focal deficit present.     Mental Status: She is alert and oriented to person, place, and time.  Psychiatric:        Mood and Affect: Mood normal.        Behavior: Behavior normal.    Patient Lines/Drains/Airways Status     Active Line/Drains/Airways     Name Placement date Placement time Site Days   Peripheral IV 07/31/22 20 G 2.5" Right Antecubital 07/31/22  1001  Antecubital  5   Fistula / Graft Left Upper arm Arteriovenous fistula 09/30/14  --  Upper arm  2866   Fistula / Graft Left Upper arm Arteriovenous fistula --  --  Upper arm  --   Fistula / Graft Left Upper arm --  --  Upper arm  --   Incision (Closed) 03/16/15 Leg Left 03/16/15  1346  -- 2699   Incision (Closed) 08/06/16 Foot Right 08/06/16  0915  -- 2190   Incision (Closed) 09/09/16 Foot Right 09/09/16  0924  -- 2156            Pertinent Labs:    Latest Ref Rng & Units 08/05/2022    1:49  AM 08/02/2022    6:21 AM 08/01/2022    3:02 AM  CBC  WBC 4.0 - 10.5 K/uL 5.2  5.9  4.9   Hemoglobin 12.0 - 15.0 g/dL 9.6  12.4  11.8   Hematocrit 36.0 - 46.0 % 29.7  39.4  37.2   Platelets 150 - 400 K/uL 133  120  162        Latest Ref Rng & Units 08/05/2022    1:49 AM 08/02/2022    7:39 AM 08/01/2022    3:02 AM  CMP  Glucose 70 - 99 mg/dL 200  273  204   BUN 8 - 23 mg/dL 28  31  39   Creatinine 0.44 - 1.00 mg/dL 3.82  4.16  5.44   Sodium 135 - 145 mmol/L 134  135  136   Potassium 3.5 - 5.1 mmol/L 3.7  3.8  4.9   Chloride 98 - 111 mmol/L 97  96  94   CO2 22 - 32 mmol/L '27  24  26   '$ Calcium 8.9 - 10.3 mg/dL 9.2  9.1  9.3     Recent Labs    08/04/22 2109 08/05/22 0753 08/05/22 1220  GLUCAP 161* 111* 306*     Pertinent Imaging: No results found.  ASSESSMENT/PLAN:  Assessment: Principal Problem:   NSTEMI (non-ST elevated myocardial infarction) ([place]) Active Problems:   Chest pain   PAC (premature atrial contraction)   Hyperlipidemia with  target LDL less than 70   Chest tightness   Robin [name] is a 72 y.o. 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   Plan: #NSTEMI #Hx of CAD s/p PCI Mentioned minor chest pressure today but it does not bother her as much as her back pain that appears to be chronic. Left heart cath done on 3/7, recommend continue medical therapy. Amlodipine held in setting of systolic pressure in the 0000000 today. Of note, family reported amlodipine causes prolonged hypotension for patient. BP this morning 110s/60-70 and no overnight hypotension documented. Patient reports some dizziness while standing with PT today. Will get some orthostatic vitals. PT and OT recommend SNF, but patient's daughter has chosen to bring patient home. She has home health nursing/PT/OT in place for patient. Patient is agreeable with going home with her daughter. Per guidelines for reduced dose of Eliquis, patient no longer meets 2 or the 3 criteria necessary for 2.'5mg'$ . Can consider changing this to '5mg'$  after discharge.  -telemetry  -orthostatic vitals -continue Plavix 75 mg daily -continue Eliquis 2.5 mg BID, consider increasing to '5mg'$  at discharge  -continue Imdur 30 mg daily -continue metoprolol succinate 25 mg daily  -continue atorvastatin 80 daily -nitro PRN   #Hx of combined systolic and diastolic HF #Hx of atrial fibrillation on anticoagulation #Hx of PE and DVT on anticoagulation Echo showed EF 45-50% with global hypokinesis of LV and G2DD. GDMT limited given ESRD. Does not appear volume overloaded today. Telemetry shows sinus rhythm.  -continue metoprolol succinate 25 mg daily -continue Eliquis 2.5 mg BID   #ESRD on HD (MWF) Nephrology following. Getting HD this morning as per schedule.   -appreciate nephrology assistance -continue ferric citrate 520 mg BID   #Type 2 DM A1c 7% in December. CBGs have been above goal. At home reported to be on levemir 15 units.   -  CBG monitoring -adjusted SSI to moderate -increase levemir to home dose to 18 units QHS   #PAD Has hx of bilateral LE arterial disease s/p LLE SFA stent in 2023. Noted hospitalization last year for infectious/ischemic disease requiring right TMA and left 1st toe amputation.  -continue atorvastatin 40 mg daily -continue Plavix 75 mg daily   #HTN #HLD Holding home lisinopril 2.5 mg now. Had overnight hypotension on 3/8. BP has been 110s/60s. Patient on atorvastatin 40 mg at home.  -hold lisinopril -continue home atorvastatin    #GERD -continue home Protonix 40 mg BID   #Schizoaffective & bipolar disorder #Dementia Follows w/ [facility] for mental health. On Abilify 12 mg daily -continue home Abilify -continue f/[facility] with [name]    #Back Pain Still has thoracolumbar back pain but patient states not worsening. Suspect likely MSK in nature given reproducibility on previous exams. Imaging unrevealing of acute process.  -continue scheduled tylenol 1000 mg q6h -lidocaine patch  -voltaren gel    Best Practice: Diet: Regular IVF: Fluids: none VTE: Eliquis Code: Full Therapy Recs:  PT & OT - SNF Family Contact: daughter DISPO: Anticipated discharge pending medical stability to home with daughter   Signature: Marisa Cyphers, Medical Student   Please contact the on call pager after 5 pm and on weekends at [phone].

## 2022-08-05 NOTE — Progress Notes (Addendum)
[facility] Progress Note   Subjective:   Patient seen and examined at bedside.  Eating breakfast and talking normally without acute distress/labored breathing.  Reports "labored breathing" but does not wish to have additional dialysis. Denies CP, abdominal pain, vomiting and diarrhea.  Admits to nausea.  Objective Vitals:   08/04/22 1344 08/04/22 2004 08/05/22 0415 08/05/22 0754  BP: (!) 148/72 (!) 154/83 128/71 (!) 148/79  Pulse: 82 81 72 82  Resp:  18 18   Temp:  97.9 F (36.6 C) 98 F (36.7 C) 97.6 F (36.4 C)  TempSrc:  Axillary Axillary Oral  SpO2:  97% 98% 100%  Weight:      Height:       Physical Exam General:well appearing female in NAD Heart:IRIR Lungs:CTAB, nml WOB on RA, no accessory muscle use Abdomen:soft, NTND Extremities:no LE edema Dialysis Access: LU AVF +b/t   Filed Weights   08/01/22 1326 08/04/22 0639 08/04/22 0753  Weight: 83.2 kg 89.2 kg 88.7 kg    Intake/Output Summary (Last 24 hours) at 08/05/2022 0831 Last data filed at 08/04/2022 1200 Gross per 24 hour  Intake --  Output 600 ml  Net -600 ml    Additional Objective Labs: Basic Metabolic Panel: Recent Labs  Lab 07/30/22 0559 07/30/22 1845 08/01/22 0302 08/02/22 0739 08/05/22 0149  NA 137   < > 136 135 134*  K 3.6   < > 4.9 3.8 3.7  CL 96*   < > 94* 96* 97*  CO2 22   < > '26 24 27  '$ GLUCOSE 48*   < > 204* 273* 200*  BUN 40*   < > 39* 31* 28*  CREATININE 5.54*   < > 5.44* 4.16* 3.82*  CALCIUM 9.3   < > 9.3 9.1 9.2  PHOS 6.0*  --  5.9*  --  2.2*   < > = values in this interval not displayed.   Liver Function Tests: Recent Labs  Lab 07/30/22 0559 08/01/22 0302  ALBUMIN 3.0* 3.1*   CBC: Recent Labs  Lab 07/30/22 0559 07/31/22 0944 08/01/22 0302 08/02/22 0621 08/05/22 0149  WBC 4.3 4.5 4.9 5.9 5.2  HGB 11.6* 12.6 11.8* 12.4 9.6*  HCT 37.5 40.2 37.2 39.4 29.7*  MCV 94.2 94.8 94.2 94.7 94.6  PLT 171 194 162 120* 133*    Medications:  sodium chloride      ferric gluconate (FERRLECIT) IVPB 125 mg (08/04/22 1505)    acetaminophen  1,000 mg Oral Q6H   apixaban  2.5 mg Oral BID   ARIPiprazole  12 mg Oral QHS   atorvastatin  80 mg Oral QHS   Chlorhexidine Gluconate Cloth  6 each Topical Q0600   clopidogrel  75 mg Oral Daily   diclofenac Sodium  4 g Topical QID   feeding supplement (NEPRO CARB STEADY)  237 mL Oral BID BM   ferric citrate  630 mg Oral BID WC   gabapentin  100 mg Oral BID   insulin aspart  0-15 Units Subcutaneous TID WC   insulin detemir  18 Units Subcutaneous QHS   isosorbide mononitrate  30 mg Oral Q breakfast   lidocaine  1 patch Transdermal Q24H   metoprolol succinate  25 mg Oral Daily   multivitamin  1 tablet Oral QHS   pantoprazole  40 mg Oral BID AC   sodium chloride flush  3 mL Intravenous Q12H   sodium chloride flush  3 mL Intravenous Q12H   umeclidinium bromide  2 puff Inhalation q  morning    Dialysis Orders: [facility] M[facility]  3.5h  400/1.5  59kg  2/2 bath  AVF  Hep none  - last HD 3/4, post wt 59.2kg - venofer '100mg'$  tiw thru 3/18 - hectorol 1 mcg IV tiw - korsuva 0.6 IV tiw - mircera 50 mcg IV q 2 wks, last 2/26, due 3/11   Assessment/Plan: NSTEMI - IV heparin, seen by cardiology, per cards/ pmd. S/p cardiac cath 3/7-per cardiology, medical management is recommended - on Plavix '75mg'$  qd, eliquis 2.'5mg'$  BID, imdur '30mg'$  qd, atorvastatin '80mg'$  qd and Toprol '25mg'$  qd.   ESRD - on HD MWF. HD tomorrow per regular schedule.  Offered extra HD today but patient refused.  HTN: BP elevated this AM, monitor. On Toprol '25mg'$  qd. Home lisinopril on hold.  volume - Weights are inaccurate - 63kg on admit with 59kg dry weight now with weights of 84-89kg recorded. Does not appear volume overloaded. Net UF 3 liters with HD 07/30/2022. UF 2.5L with HD yesterday. Noted patient had episode of hypotension and difficult to arouse on 3/9. She then returned to baseline after NS bolus and sternal rubs. Last CXR was negative for pulmonary edema and  she remains euvolemic on exam. No issues noted overnight. UF as tolerated. Reported labored breathing but does not appear to have increased WOB or signs of respiratory distress.  Offered extra HD but patient refused.  Anemia esrd - Hb drop 9.6. Will order aranesp. Getting iron load.  MBD ckd - CCa in range and Phos a little low. Cont IV vdra and decrease auryxia to 2AC TID.  Atrial fib - per pmd PAD - hx foot/ toe amps H/o DVT/ pulm embolism Hx dementia/ bipolar/ schizoaffective d/o  Dispo - SNF recommended but patient refused.  Jen Mow, PA-C [facility] 08/05/2022,8:31 AM  LOS: 6 days    Seen and examined independently.  Agree with note and exam as documented above by physician extender and as noted here.  She reports some shortness of breath and is comfortable on room air at rest on my exam.  Declined an extra treatment today.   General elderly female in bed in no acute distress HEENT normocephalic atraumatic extraocular movements intact sclera anicteric Neck supple trachea midline Lungs clear to auscultation bilaterally normal work of breathing at rest on room air  Heart S1S2 no rub Abdomen soft nontender nondistended Extremities no edema  Psych normal mood and affect Access LUE AVF bruit and thrill   ESRD on HD - MWF schedule.  Patient declined an extra tx today.  Check daily weights (thus far these do not appear accurate)  NSTEMI - per cardiology and primary team   HTN - controlled but dizzy on meds to optimize medical therapy of NSTEMI.  As above requested weights to better guide   Anemia CKD - defer ESA inpatient for now given recent NSTEMI - anticipate resuming soon.   Claudia Desanctis, MD 08/05/2022 11:06 AM

## 2022-08-05 NOTE — Discharge Instructions (Addendum)
You were hospitalized for chest pain. Cardiology evaluated and recommended continue medical therapy. I am glad to hear your chest pain has improved and you are feeling better. Home health services such as physical therapy has been ordered. Please make sure to continue your outpatient dialysis sessions, tomorrow Wed 3/12. Please make sure to follow up with your cardiologist, nephrologist and your primary care at the [facility] center after this hospitalization. Please see below for the medication changes. I have sent prescriptions to your local pharmacy. I also sent your Abilify 12 mg daily to the pharmacy as well. Thank you for allowing [place] to be part of your care.   Please make follow up appointments with: -Primary care doctor at the [facility] center -Cardiology: office for Dr. Shelva Majestic  Please note these changes made to your medications:  *Please START taking:  --Atorvastatin 80 mg daily --Clopidogrel (Plavix) 75 mg daily  --Isosorbide mononitrate (Imdur) 30 mg daily --Metoprolol 25 mg daily  --Levemir (long-acting insulin) 18 units daily --Voltaren gel use as needed for back pain  *Please STOP taking:  --Lisinopril until you see your primary care or nephrologist   Please make sure to go to your follow up appointments with cardiology and your [place] center.

## 2022-08-05 NOTE — Progress Notes (Addendum)
Contacted attending to inquire about pt's possible d/c date. Attending unsure at this time if pt will d/c today or not. Will contact out-pt HD clinic with pt's d/c date once confirmed.   Melven Sartorius Renal Navigator [phone]  Addendum at 4:00 pm: Advised by attending that pt stable for d/c today. Contacted [place] to advise clinic of pt's d/c today and that pt should resume care tomorrow.

## 2022-08-05 NOTE — Inpatient Diabetes Management (Signed)
Inpatient Diabetes Program Recommendations  AACE/ADA: New Consensus Statement on Inpatient Glycemic Control (2015)  Target Ranges:  Prepandial:   less than 140 mg/dL      Peak postprandial:   less than 180 mg/dL (1-2 hours)      Critically ill patients:  140 - 180 mg/dL   Lab Results  Component Value Date   GLUCAP 111 (H) 08/05/2022   HGBA1C 7.7 (H) 07/29/2022    Review of Glycemic Control  Latest Reference Range & Units 08/04/22 13:11 08/04/22 17:30 08/04/22 21:09 08/05/22 07:53  Glucose-Capillary 70 - 99 mg/dL 183 (H) 295 (H) 161 (H) 111 (H)  (H): Data is abnormally high Diabetes history: DM 2 Outpatient Diabetes medications:  Novolog 6 units tid with meals Levemir 15 units q HS Current orders for Inpatient glycemic control:  Novolog 0-6 units tid with meals Levemir 18 units q HS   Inpatient Diabetes Program Recommendations:    If to remain inpatient consider: -Changing diet to carb modified? -Adding Novolog 2 units TID (assuming patient consuming >50% of meals)  Thanks, Bronson Curb, MSN, RNC-OB Diabetes Coordinator [phone] (8a-5p)

## 2022-08-05 NOTE — Discharge Summary (Signed)
Name: Robin [name] MRN: [id number] DOB: 07/29/1950 72 y.o. PCP: [facility]  Date of Admission: 07/29/2022  4:15 AM Date of Discharge: 08/05/22 Attending Physician: Dr. Angelia Mould  Discharge Diagnosis: Principal Problem:   NSTEMI (non-ST elevated myocardial infarction) [facility]) Active Problems:   Diabetes mellitus type 2, insulin dependent ([place])   Hypertension due to end stage renal disease caused by type 2 diabetes mellitus, on dialysis ([place])   Chronic diastolic (congestive) heart failure (HCC)   PAC (premature atrial contraction)   Hyperlipidemia with target LDL less than 70   Malnutrition of moderate degree    Discharge Medications: Allergies as of 08/05/2022   No Known Allergies      Medication List     STOP taking these medications    lisinopril 5 MG tablet Commonly known as: ZESTRIL       TAKE these medications    acetaminophen 325 MG tablet Commonly known as: TYLENOL Take 975 mg by mouth 3 (three) times a week.   albuterol 108 (90 Base) MCG/ACT inhaler Commonly known as: VENTOLIN HFA Inhale 1-2 puffs into the lungs every 6 (six) hours as needed for wheezing or shortness of breath.   apixaban 5 MG Tabs tablet Commonly known as: ELIQUIS Take 2.5 mg by mouth 2 (two) times daily with a meal.   ARIPiprazole 2 MG tablet Commonly known as: ABILIFY Take 6 tablets (12 mg total) by mouth at bedtime. What changed:  how much to take additional instructions Another medication with the same name was removed. Continue taking this medication, and follow the directions you see here.   atorvastatin 80 MG tablet Commonly known as: LIPITOR Take 1 tablet (80 mg total) by mouth at bedtime. What changed: how much to take   CALCIUM PO Take 1 tablet by mouth daily.   clopidogrel 75 MG tablet Commonly known as: PLAVIX Take 1 tablet (75 mg total) by mouth daily. Start taking on: August 06, 2022   diclofenac Sodium 1 % Gel Commonly known as:  VOLTAREN Apply 4 g topically 4 (four) times daily as needed (for back pain).   ferric citrate 1 GM 210 MG(Fe) tablet Commonly known as: AURYXIA Take 420 mg by mouth 2 (two) times daily with a meal.   gabapentin 100 MG capsule Commonly known as: NEURONTIN Take 100 mg by mouth 2 (two) times daily.   insulin aspart 100 UNIT/ML injection Commonly known as: novoLOG Inject 6 Units into the skin 3 (three) times daily as needed for high blood sugar (CBG >300).   isosorbide mononitrate 30 MG 24 hr tablet Commonly known as: IMDUR Take 1 tablet (30 mg total) by mouth daily with breakfast. Start taking on: August 06, 2022 What changed:  medication strength how much to take   Levemir FlexTouch 100 UNIT/ML FlexPen Generic drug: insulin detemir Inject 18 Units into the skin daily. What changed: how much to take   Melatonin 10 MG Tabs Take 10 mg by mouth at bedtime.   metoprolol succinate 25 MG 24 hr tablet Commonly known as: TOPROL-XL Take 1 tablet (25 mg total) by mouth daily. Start taking on: August 06, 2022 What changed:  medication strength how much to take   multivitamin Tabs tablet Take 1 tablet by mouth at bedtime.   nitroGLYCERIN 0.4 MG SL tablet Commonly known as: NITROSTAT Place 0.4 mg under the tongue every 5 (five) minutes as needed for chest pain.   pantoprazole 40 MG tablet Commonly known as: PROTONIX Take 40 mg by  mouth 2 (two) times daily before a meal.   Spiriva Respimat 1.25 MCG/ACT Aers Generic drug: Tiotropium Bromide Monohydrate Inhale 2 puffs into the lungs every morning.        Disposition and follow-up:   [name] was discharged from [facility] in Stable condition.  At the hospital follow up visit please address:  1.  Follow-up:  a. NSTEMI: Chest pain, adherence to medications    b. ESRD: MWF dialysis schedule, volume status, weight   c. Chronic back pain, assess for improvement    d. Hypertension-given hypotensive  episodes in the hospital, consider adjusting antihypertensive medications, lisinopril was held  2.  Labs / imaging needed at time of follow-up: BMP, CBC, Phosphorus  3.  Medication Changes  Stopped: Lisinopril 2.5 mg  Changed:  --Atorvastatin 80 mg daily --Clopidogrel (Plavix) 75 mg daily  --Imdur 30 mg daily --Metoprolol 25 mg daily  --Levemir 18 units daily --Voltaren gel use as needed for back pain   Follow-up Appointments:  Follow-up Information     [facility] Follow up.   Specialty: Home Health Services Why: Home Health Physical Therapy and Occupational Therapy-office to call with visit times. Contact information: [address] [place] [address] [place] Schedule an appointment as soon as possible for a visit.   Specialty: General Practice Contact information: [address] [place] [address] [phone]         Troy Sine, MD. Schedule an appointment as soon as possible for a visit.   Specialty: Cardiology Contact information: [place] [address] [phone]                 Hospital Course by problem list:  Robin [name] is a 72 y.o. 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   NSTEMI Hx of CAD s/p PCI Patient presented on 3/5 with constant central chest pressure radiating to the L neck. Has significant cardiac history with prior stent placement. Most recent cath in 2019 with non-obstructive disease in the LAD/RCA, circumflex heavily calcified with stenosis. Initially cardiology recommended to continue medical therapy due to difficult PCI with calcification/prior stent in this area.  EKG showed more prominent TWI in the inferior  leads and QRS changes in V2/V3. Initial troponin elevated at 200, flattened to 167. Echo here demonstrated EF 45-50% with global hypokinesis of the LV w/ G2DD. Heparin drip started the day of admission. Patient continued to report chest pain requiring SL Nitro, cardiology performed left heart cath on 3/7. Post cath, cardiology again recommended medical therapy with clopidogrel '75mg'$  daily, metoprolol succinate '25mg'$  daily, imdur '30mg'$  daily, and atorvastatin '80mg'$  daily. Patient was on amlodipine briefly but stopped in setting of hypotension. Of note, family reported amlodipine causes prolonged hypotension for patient. PT and OT recommend SNF but patient declined. Will discharge with home health PT and OT. Patient will f/u with outpatient cardiology.    Hx of combined systolic and diastolic HF Echo here demonstrates EF 45-50% with global hypokinesis of the LV w/ G2DD. GDMT limited by ESRD. No signs of volume overload on exam during hospitalization.    Back pain Has tenderness to palpation of the midline lumbar spine without deformities noted. Suspect  likely musculoskeletal in nature rather than radiating chest pain. Xray of the spine shows no fractures. Patient's daughter reports that her back pain is longstanding. Added lidocaine patch and voltaren gel to regimen with patient reporting some improvement in her back pain.   Hx of atrial fibrillation Hx of pulmonary embolism and DVT Remains in sinus rhythm. No extremity pain, increased oxygen requirements, or tachycardia. Low suspicion for DVT/PE at this time. Patient is on Eliquis 2.5 mg BID at home, which was resumed during hospital course.   ESRD on HD (MWF) No missed HD sessions as outpatient. Received HD while hospitalized. Renal navigator contacted outpatient HD center to resume care at time of discharge.   Feet pain Describes this pain as similar to her history of neuropathy in the setting of T2DM. Restarted her home gabapentin.   Type 2 DM Most  recent A1c in 04/2022 of 7.0%. CBGs notably high for most of the hospital course. Increased long acting insulin to 18 U daily with improvement.    PAD Has hx of bilateral LE arterial disease. S/p LLE SFA stent in 2023. Hospitalized last year for infectious/ischemic disease requiring right TMA and left 1st toe amputation.   Schizoaffective & bipolar disorder Dementia Follows w/ [facility] as outpatient. Patient is on Abilify 12 mg daily.    GERD Continue home Protonix 40 mg BID   HLD Continue home atorvastatin 40 mg daily   HTN Held Lisinopril 2.5 mg given patient had hypotensive episodes. Advised patient and patient's daughter to discuss restarting it at her hospital follow up.    Discharge Subjective: Patient continues to report back pain but chest pain has improved since admission. She is agreeable with going home with her daughter. She reports good appetite, alert and oriented x3.  Discharge Exam:   BP (!) 155/91   Pulse 82   Temp 97.6 F (36.4 C) (Oral)   Resp 18   Ht '5\' 4"'$  (1.626 m)   Wt 63 kg   SpO2 100%   BMI 23.84 kg/m  Constitutional: well-appearing elderly female sitting in chair, in no acute distress HENT: normocephalic atraumatic, mucous membranes moist Neck: supple Cardiovascular: regular rate and rhythm Pulmonary/Chest: normal work of breathing on room air, lungs clear to auscultation bilaterally Abdominal: soft, non-tender, non-distended MSK: normal bulk and tone, paraspinal tenderness of thoracolumbar, no deformity noted Neurological: alert & oriented x 3 Skin: warm and dry Psych: normal mood and affect    Pertinent Labs, Studies, and Procedures:     Latest Ref Rng & Units 08/05/2022    1:49 AM 08/02/2022    6:21 AM 08/01/2022    3:02 AM  CBC  WBC 4.0 - 10.5 K/uL 5.2  5.9  4.9   Hemoglobin 12.0 - 15.0 g/dL 9.6  12.4  11.8   Hematocrit 36.0 - 46.0 % 29.7  39.4  37.2   Platelets 150 - 400 K/uL 133  120  162        Latest Ref Rng & Units  08/05/2022    1:49 AM 08/02/2022    7:39 AM 08/01/2022    3:02 AM  CMP  Glucose 70 - 99 mg/dL 200  273  204   BUN 8 - 23 mg/dL 28  31  39   Creatinine 0.44 - 1.00 mg/dL 3.82  4.16  5.44   Sodium 135 - 145 mmol/L 134  135  136   Potassium 3.5 - 5.1 mmol/L 3.7  3.8  4.9   Chloride 98 -  111 mmol/L 97  96  94   CO2 22 - 32 mmol/L '27  24  26   '$ Calcium 8.9 - 10.3 mg/dL 9.2  9.1  9.3     ECHOCARDIOGRAM COMPLETE  Result Date: 07/29/2022    ECHOCARDIOGRAM REPORT   Patient Name:   Robin [name] Date of Exam: 07/29/2022 Medical Rec #:  [id number]    Height:       64.0 in Accession #:    [id number]   Weight:       138.9 lb Date of Birth:  [birth year]    BSA:          1.676 m Patient Age:    44 years     BP:           128/67 mmHg Patient Gender: F            HR:           71 bpm. Exam Location:  Inpatient Procedure: 2D Echo, Cardiac Doppler and Color Doppler Indications:    Chest Pain R07.9                 Elevated Troponin  History:        Patient has prior history of Echocardiogram examinations, most                 recent 04/15/2019. CHF, Arrythmias:Tachycardia,                 Signs/Symptoms:Murmur and Chest Pain; Risk Factors:Dyslipidemia,                 Sleep Apnea, Hypertension and Current Smoker.  Sonographer:    Greer Pickerel Referring Phys: [id number] ELIZABETH REES  Sonographer Comments: Image acquisition challenging due to respiratory motion. IMPRESSIONS  1. Left ventricular ejection fraction, by estimation, is 45 to 50%. The left ventricle has mildly decreased function. The left ventricle demonstrates global hypokinesis. There is mild concentric left ventricular hypertrophy. Left ventricular diastolic parameters are consistent with Grade II diastolic dysfunction (pseudonormalization).  2. Right ventricular systolic function is mildly reduced. The right ventricular size is mildly enlarged. There is mildly elevated pulmonary artery systolic pressure. The estimated right ventricular systolic pressure is [id number].  3.  The mitral valve is grossly normal. Trivial mitral valve regurgitation. No evidence of mitral stenosis.  4. The aortic valve is tricuspid. Aortic valve regurgitation is not visualized. Aortic valve sclerosis is present, with no evidence of aortic valve stenosis.  5. The inferior vena cava is dilated in size with <50% respiratory variability, suggesting right atrial pressure of 15 mmHg. FINDINGS  Left Ventricle: Left ventricular ejection fraction, by estimation, is 45 to 50%. The left ventricle has mildly decreased function. The left ventricle demonstrates global hypokinesis. The left ventricular internal cavity size was normal in size. There is  mild concentric left ventricular hypertrophy. Left ventricular diastolic parameters are consistent with Grade II diastolic dysfunction (pseudonormalization). Right Ventricle: The right ventricular size is mildly enlarged. No increase in right ventricular wall thickness. Right ventricular systolic function is mildly reduced. There is mildly elevated pulmonary artery systolic pressure. The tricuspid regurgitant  velocity is 2.55 m/s, and with an assumed right atrial pressure of 15 mmHg, the estimated right ventricular systolic pressure is [id number] mmHg. Left Atrium: Left atrial size was normal in size. Right Atrium: Right atrial size was normal in size. Pericardium: There is no evidence of pericardial effusion. Mitral Valve: The mitral valve is grossly normal. Trivial mitral valve regurgitation. No  evidence of mitral valve stenosis. Tricuspid Valve: The tricuspid valve is grossly normal. Tricuspid valve regurgitation is trivial. No evidence of tricuspid stenosis. Aortic Valve: The aortic valve is tricuspid. Aortic valve regurgitation is not visualized. Aortic valve sclerosis is present, with no evidence of aortic valve stenosis. Pulmonic Valve: The pulmonic valve was grossly normal. Pulmonic valve regurgitation is trivial. No evidence of pulmonic stenosis. Aorta: The aortic root  and ascending aorta are structurally normal, with no evidence of dilitation. Venous: The inferior vena cava is dilated in size with less than 50% respiratory variability, suggesting right atrial pressure of 15 mmHg. IAS/Shunts: The atrial septum is grossly normal.  LEFT VENTRICLE PLAX 2D LVIDd:         3.90 cm      Diastology LVIDs:         3.40 cm      LV e' medial:    3.65 cm/s LV PW:         1.20 cm      LV E/e' medial:  27.7 LV IVS:        1.30 cm      LV e' lateral:   5.27 cm/s LVOT diam:     1.70 cm      LV E/e' lateral: 19.2 LV SV:         39 LV SV Index:   23 LVOT Area:     2.27 cm  LV Volumes (MOD) LV vol d, MOD A2C: 123.0 ml LV vol d, MOD A4C: 95.1 ml LV vol s, MOD A2C: 77.7 ml LV vol s, MOD A4C: 67.8 ml LV SV MOD A2C:     45.3 ml LV SV MOD A4C:     95.1 ml LV SV MOD BP:      34.7 ml RIGHT VENTRICLE RV S prime:     7.05 cm/s TAPSE (M-mode): 1.3 cm LEFT ATRIUM             Index        RIGHT ATRIUM           Index LA diam:        3.00 cm 1.79 cm/m   RA Area:     27.40 cm LA Vol (A2C):   43.1 ml 25.72 ml/m  RA Volume:   91.10 ml  54.37 ml/m LA Vol (A4C):   47.1 ml 28.11 ml/m LA Biplane Vol: 46.2 ml 27.57 ml/m  AORTIC VALVE             PULMONIC VALVE LVOT Vmax:   80.90 cm/s  PR End Diast Vel: 5.34 msec LVOT Vmean:  49.300 cm/s LVOT VTI:    0.171 m  AORTA Ao Root diam: 3.10 cm Ao Asc diam:  3.40 cm MITRAL VALVE                TRICUSPID VALVE MV Area (PHT): 3.65 cm     TR Peak grad:   26.0 mmHg MV Decel Time: 208 msec     TR Vmax:        255.00 cm/s MV E velocity: 101.00 cm/s MV A velocity: 87.90 cm/s   SHUNTS MV E/A ratio:  1.15         Systemic VTI:  0.17 m                             Systemic Diam: 1.70 cm Eleonore Chiquito MD Electronically signed by Eleonore Chiquito MD Signature Date/Time: 07/29/2022/1:28:42  PM    Final    CT Cervical Spine Wo Contrast  Result Date: 07/29/2022 CLINICAL DATA:  72 year old female status post fall 2 weeks ago with persistent dizziness, nausea. Vomiting and decreased  appetite. Dialysis patient. EXAM: CT CERVICAL SPINE WITHOUT CONTRAST TECHNIQUE: Multidetector CT imaging of the cervical spine was performed without intravenous contrast. Multiplanar CT image reconstructions were also generated. RADIATION DOSE REDUCTION: This exam was performed according to the departmental dose-optimization program which includes automated exposure control, adjustment of the mA and/or kV according to patient size and/or use of iterative reconstruction technique. COMPARISON:  Head CT today reported separately. Cervical spine CT 10/27/2020. FINDINGS: Alignment: Chronic anterolisthesis of C4 on C5. Chronic straightening of cervical lordosis. Less reversal compared to 2022. Cervicothoracic junction alignment is within normal limits. Bilateral posterior element alignment is within normal limits. Skull base and vertebrae: Stable bone mineralization. Visualized skull base is intact. No atlanto-occipital dissociation. C1 and C2 appear intact and aligned. Chronic endplate erosions [id number], C5-C6. Chronic C6 inferior endplate compression. Unilateral chronic C4-C5 facet erosion. Suspect chronic dialysis related spondyloarthropathy. No acute osseous abnormality identified. Soft tissues and spinal canal: No prevertebral fluid or swelling. No visible canal hematoma. Disc levels: Chronic degeneration C4-C5 through C6-C7. Mild multifactorial spinal stenosis suspected at C4-C5. Upper chest: Osteopenia. No acute osseous abnormality identified. Negative lung apices. Small volume retained gas and secretions in the upper thoracic esophagus. IMPRESSION: 1. No acute traumatic injury identified in the cervical spine. 2. 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 Mild multifactorial spinal stenosis suspected at C4-C5. Electronically Signed   By: Genevie Ann M.D.   On: 07/29/2022 06:03   CT Head Wo Contrast  Result Date: 07/29/2022 CLINICAL DATA:  72 year old female status post fall 2 weeks ago with  persistent dizziness, nausea. Vomiting and decreased appetite. Dialysis patient. EXAM: CT HEAD WITHOUT CONTRAST TECHNIQUE: Contiguous axial images were obtained from the base of the skull through the vertex without intravenous contrast. RADIATION DOSE REDUCTION: This exam was performed according to the departmental dose-optimization program which includes automated exposure control, adjustment of the mA and/or kV according to patient size and/or use of iterative reconstruction technique. COMPARISON:  Head CT 10/27/2020.  Brain MRI 07/27/2020. FINDINGS: Brain: Stable cerebral volume. No midline shift, ventriculomegaly, mass effect, evidence of mass lesion, intracranial hemorrhage or evidence of cortically based acute infarction. Stable and normal for age gray-white differentiation. No convincing encephalomalacia. Vascular: Extensive Calcified atherosclerosis at the skull base. No suspicious intracranial vascular hyperdensity. Skull: No acute osseous abnormality identified. Sinuses/Orbits: Visualized paranasal sinuses and mastoids are stable and well aerated. Other: Calcified scalp vessel atherosclerosis. No orbit or scalp soft tissue injury identified. IMPRESSION: 1. No recent traumatic injury identified. Stable and normal for age noncontrast CT appearance of the brain. 2. Advanced calcified atherosclerosis compatible with End stage renal disease. Electronically Signed   By: Genevie Ann M.D.   On: 07/29/2022 05:58   DG Chest Port 1 View  Result Date: 07/29/2022 CLINICAL DATA:  72 year old female with history of shortness of breath. EXAM: PORTABLE CHEST 1 VIEW COMPARISON:  Chest x-ray 04/27/2022. FINDINGS: Lung volumes are normal. No consolidative airspace disease. No pleural effusions. No pneumothorax. No evidence of pulmonary edema. Heart size is mildly enlarged (unchanged). Upper mediastinal contours are within normal limits. Atherosclerotic calcifications are noted in the thoracic aorta. Vascular stent projecting  over the left axillary region. IMPRESSION: 1. No radiographic evidence of acute cardiopulmonary disease. 2. Mild cardiomegaly. 3. Aortic atherosclerosis. Electronically Signed   By: Quillian Quince  Entrikin M.D.   On: 07/29/2022 05:16     Discharge Instructions: Discharge Instructions     Amb Referral to Cardiac Rehabilitation   Complete by: As directed    Diagnosis: NSTEMI   After initial evaluation and assessments completed: Virtual Based Care may be provided alone or in conjunction with Phase 2 Cardiac Rehab based on patient barriers.: Yes   Intensive Cardiac Rehabilitation (ICR) [place] location only OR Traditional Cardiac Rehabilitation (TCR) *If criteria for ICR are not met will enroll in TCR [facility] only): Yes   Call MD for:  difficulty breathing, headache or visual disturbances   Complete by: As directed    Call MD for:  extreme fatigue   Complete by: As directed    Call MD for:  hives   Complete by: As directed    Call MD for:  persistant dizziness or light-headedness   Complete by: As directed    Call MD for:  persistant nausea and vomiting   Complete by: As directed    Call MD for:  redness, tenderness, or signs of infection (pain, swelling, redness, odor or green/yellow discharge around incision site)   Complete by: As directed    Call MD for:  severe uncontrolled pain   Complete by: As directed    Call MD for:  temperature >100.4   Complete by: As directed    Diet - low sodium heart healthy   Complete by: As directed    Increase activity slowly   Complete by: As directed        Signed: Marisa Cyphers, Medical Student 08/05/2022, 4:41 PM    Attestation for Student Documentation:  I personally was present and performed or re-performed the history, physical exam and medical decision-making activities of this service and have verified that the service and findings are accurately documented in the student's note.  Angelique Blonder, DO 08/05/2022, 4:41 PM

## 2022-08-07 ENCOUNTER — Emergency Department (HOSPITAL_COMMUNITY): Payer: Medicare PPO

## 2022-08-07 ENCOUNTER — Emergency Department (HOSPITAL_COMMUNITY)
Admission: EM | Admit: 2022-08-07 | Discharge: 2022-08-07 | Discharge status: Against Medical Advice | Payer: Medicare PPO | Attending: Emergency Medicine | Admitting: Emergency Medicine

## 2022-08-07 ENCOUNTER — Other Ambulatory Visit: Payer: Self-pay

## 2022-08-07 ENCOUNTER — Encounter (HOSPITAL_COMMUNITY): Payer: Self-pay

## 2022-08-07 DIAGNOSIS — R110 Nausea: Secondary | ICD-10-CM | POA: Insufficient documentation

## 2022-08-07 DIAGNOSIS — R0602 Shortness of breath: Secondary | ICD-10-CM | POA: Insufficient documentation

## 2022-08-07 DIAGNOSIS — I2510 Atherosclerotic heart disease of native coronary artery without angina pectoris: Secondary | ICD-10-CM

## 2022-08-07 DIAGNOSIS — Z5321 Procedure and treatment not carried out due to patient leaving prior to being seen by health care provider: Secondary | ICD-10-CM | POA: Insufficient documentation

## 2022-08-07 DIAGNOSIS — R072 Precordial pain: Secondary | ICD-10-CM | POA: Insufficient documentation

## 2022-08-07 DIAGNOSIS — R0789 Other chest pain: Secondary | ICD-10-CM

## 2022-08-07 LAB — CBC
HCT: 33 % — ABNORMAL LOW (ref 36.0–46.0)
Hemoglobin: 10.3 g/dL — ABNORMAL LOW (ref 12.0–15.0)
MCH: 30.3 pg (ref 26.0–34.0)
MCHC: 31.2 g/dL (ref 30.0–36.0)
MCV: 97.1 fL (ref 80.0–100.0)
Platelets: 171 10*3/uL (ref 150–400)
RBC: 3.4 MIL/uL — ABNORMAL LOW (ref 3.87–5.11)
RDW: 20.2 % — ABNORMAL HIGH (ref 11.5–15.5)
WBC: 6.3 10*3/uL (ref 4.0–10.5)
nRBC: 0 % (ref 0.0–0.2)

## 2022-08-07 LAB — TROPONIN I (HIGH SENSITIVITY)
Troponin I (High Sensitivity): 54 ng/L — ABNORMAL HIGH (ref <18)
Troponin I (High Sensitivity): 50 ng/L — ABNORMAL HIGH (ref <18)

## 2022-08-07 LAB — BASIC METABOLIC PANEL
Anion gap: 12 (ref 5–15)
BUN: 22 mg/dL (ref 8–23)
CO2: 28 mmol/L (ref 22–32)
Calcium: 9.8 mg/dL (ref 8.9–10.3)
Chloride: 95 mmol/L — ABNORMAL LOW (ref 98–111)
Creatinine, Ser: 3.74 mg/dL — ABNORMAL HIGH (ref 0.44–1.00)
GFR, Estimated: 12 mL/min — ABNORMAL LOW (ref >60)
Glucose, Bld: 135 mg/dL — ABNORMAL HIGH (ref 70–99)
Potassium: 3.9 mmol/L (ref 3.5–5.1)
Sodium: 135 mmol/L (ref 135–145)

## 2022-08-07 MED ORDER — IOHEXOL 350 MG/ML SOLN
100.0000 mL | Freq: Once | INTRAVENOUS | Status: AC | PRN
  Administered 2022-08-07: 100 mL via INTRAVENOUS

## 2022-08-07 MED ORDER — LIDOCAINE 5 % EX PTCH: 1.0000 | MEDICATED_PATCH | CUTANEOUS | Status: DC

## 2022-08-07 MED ORDER — OXYCODONE-ACETAMINOPHEN 5-325 MG PO TABS: 1.0000 | ORAL_TABLET | Freq: Once | ORAL | Status: DC

## 2022-08-07 MED ORDER — FENTANYL CITRATE PF 50 MCG/ML IJ SOSY
50.0000 ug | PREFILLED_SYRINGE | Freq: Once | INTRAMUSCULAR | Status: AC
  Administered 2022-08-07: 50 ug via INTRAVENOUS
  Filled 2022-08-07: qty 1

## 2022-08-07 NOTE — ED Notes (Signed)
MD at bedside. 

## 2022-08-07 NOTE — ED Notes (Signed)
MD notified that daughter would like to know about everything has happened and is happening.  MD okay for pt to eat and drink sandwich and soda provided

## 2022-08-07 NOTE — ED Notes (Signed)
Pts daughter refusing to sign the AMA form. Pt and family was educated on the processs and encouraged to stay and receive full treatment. Pts family is upset and continues to be verbally abusive towards this staff member. Pts daughter getting pt dressed and in the wheel chair at this time. And leaving the department. Dr. Nechama Guard made aware.

## 2022-08-07 NOTE — ED Provider Triage Note (Signed)
Emergency Medicine Provider Triage Evaluation Note  ARMINE JAEN , a 72 y.o. female  was evaluated in triage.  Pt complains of upper back pain.  She has a past medical history of end-stage renal disease on dialysis Monday Wednesday Friday and was discharged 2 days ago after having an NSTEMI.  Review of EMR shows patient was given Lidoderm and Voltaren gel.  She has a longstanding history of back pain.  She states that the pain is between her shoulder blades and is worse with movement touch and patient position change.  She denies cough or fevers.  She did not take anything at home for her pain.  Review of Systems  Positive: Back pain Negative: fever  Physical Exam  BP 132/75 (BP Location: Right Arm)   Pulse 69   Temp 98 F (36.7 C) (Oral)   Resp 16   SpO2 97%  Gen:   Awake, no distress   Resp:  Normal effort  MSK:   Moves extremities without difficulty  Other:  Upper back  Medical Decision Making  Medically screening exam initiated at 9:22 AM.  Appropriate orders placed.  MAILY EMGE was informed that the remainder of the evaluation will be completed by another provider, this initial triage assessment does not replace that evaluation, and the importance of remaining in the ED until their evaluation is complete.     Margarita Mail, PA-C 08/07/22 [phone]

## 2022-08-07 NOTE — ED Provider Notes (Signed)
Clinical Course as of 08/07/22 2039  Thu Aug 07, 2022  1513 72 F with back pain radiating to chest. Had recent cath and found to have multivessel disease recommended medical management. Trop 54 downtrended from prior. CTA dissection and repeat troponin pending. Cardiology recs pending. [VB]  [id number] Patient's repeat troponin down trended to 50.  CTA dissection negative for dissection.  Incidental findings as listed below.  Old endplate deformities of thoracic spine likely contributing to back pain.  IMPRESSION: No evidence of aortic aneurysm or dissection.  Scattered atherosclerotic plaques including coronary arteries.  BILATERAL renal cortical atrophy.  Subsegmental atelectasis LEFT lower lobe.  No acute intra-abdominal or intrapelvic abnormalities.  Mild superior endplate deformities of multiple thoracic vertebra, age indeterminate.   [VB]  1745 Pending cards consult. [VB]  1911 I have repaged cardiology waiting on recommendations.  According to nursing staff there has been a PA from cardiology down to see patient.  Note is pending. [VB]  2014 I have spoken to Cardiology PA regarding patient, pending Dr Marlou Porch of cardiology to evaluate patient final recs. [VB]  2038 Dr Marlou Porch of cardiology was on his way to see patient however patient had left AGAINST MEDICAL ADVICE.  She did sign paperwork with nursing staff.  Cardiology did send notice for follow-up to be scheduled with patient. [VB]    Clinical Course User Index [VB] Elgie Congo, MD      Elgie Congo, MD 08/07/22 2039

## 2022-08-07 NOTE — Consult Note (Addendum)
Cardiology Consultation   [name] MRN: [id number]; DOB: 1950/09/19  Admit date: 08/07/2022 Date of Consult: 08/07/2022  PCP:  [facility] Cardiologist:  Shelva Majestic, MD        Patient Profile:   Robin [name] is a 72 y.o. female with a hx of ESRD on dialysis, paroxysmal A-fib, remote PE/DVT, CAD s/p PCI to proximal circumflex in 2016 with recent NSTEMI this month with moderate nonobstructive disease 07/31/22, lower extremity necrotizing infctions s/p amputations, combined chronic systolic and diastolic heart failure, dementia, bipolar, COPD, T2DM, PVD s/p LLE SFA stent 2023, prolonged QT interval, HTN, and HLD who is being seen 08/07/2022 for the evaluation of back pain that radiates to the front at the request of Dr. Nechama Guard. Last admission was on 07/29/2022 for NSTEMI and underwent another cardiac catherization with no interventions.  History of Present Illness:   Robin [name] has extensive coronary artery and vascular disease outlined above with prior caths in 2019 and as recently as 07/31/22 with recommendation for medical management.  Past notes state that she has been adherent to her Eliquis dosing for previous history of PE and DVT. She also has bilateral lower extremity arterial disease, s/p LLE SFA stent 2023. Recent hospitalization last year for infectious/ischemic disease requiring right TMA and left 1st two toe amputation.    She was admitted last week on 07/29/2022-08/05/2022 with complaints of chest pressure that was radiating to the left neck.  She had some EKG changes that showed prominent T wave inversions in inferior leads and some QRS changes in septal leads with elevated but peak troponin of 200.  She then underwent an echo which demonstrated a EF of 45 to 50% with global hypokinesis of the LV 2 diastolic dysfunction (previously 50-55%).  Patient continued to report chest pain requiring sublingual nitro and so a cath  was performed on 07/31/2022. Cath showed patent ramus stent with minimal ISR, moderate proximal to mid LAD lesions with RFR 0.95, moderate ostial left Cx lesion with RFR 1.0, moderate mid RCA with an RFR 0.92. Medical therapy was recommended.   She was discharged on Plavix 75 mg daily, metoprolol succinate 25 mg daily, Imdur 30 mg daily, atorvastatin '80mg'$  daily as well as continuation of PTA Eliquis.  Today she presents with similar complaints as before.  She states that she has had this 10/10 sharp shooting back pain/spasm for the last 3 weeks and has been worsening.  She notes that the pain that starts between her shoulder blades and radiates to the front of her chest.  She does note some shortness of breath with this however has shortness of breath at baseline and also some heart palpitations. She denied any accompanied nausea, vomiting, or sweating. Typically these bouts come aggressively and then are relived moments later and occurring at rest.  She is largely immobile and does not walk very much at home.  At discharge she was prescribed lidocaine patches but has not been using them regularly.  Her daughter Robin [name] is typically the person that gives her medications and gives it sparingly per her son Robin [name] who is here today.  She has been following up regularly with dialysis and had HD done yesterday.  She had her last Eliquis dose along with the rest of her meds sometime between 1600- 2000 yesterday.  ED Workup: EKG NSR with sinus arrhythmia, left axis deviation, continued nonspecific STTW changes and QT prolongation similar to prior, and occasional  PVCs. Neg CXR, CTA neg for dissection, mildly elevated trop of 54->50 which is down from 200 from prior recent admission, 3.74 Cr. Patient has been given fentanyl. Hypertensive [id number] systolic.  Past Medical History:  Diagnosis Date   Acute delirium 11/18/2014   Acute encephalopathy    Acute on chronic respiratory failure with hypoxia (HCC) 09/09/2016    Acute respiratory failure with hypoxia (HCC) 11/29/2015   Anemia in chronic kidney disease 12/09/2014   Anxiety    Arthritis    Benign hypertension    Bipolar affective disorder ([place]) 12/09/2014   CAD (coronary artery disease), native coronary artery with 2 stents  11/17/2014   Charcot foot due to diabetes mellitus ([place])    Chronic combined systolic and diastolic CHF (congestive heart failure) ([place]) 11/18/2014   Closed left ankle fracture 11/17/2014   Confusion 01/21/2015   Depression    Diabetes mellitus without complication (HCC)    End stage renal disease on dialysis ([place])    Fracture dislocation of ankle 11/17/2014   GERD (gastroesophageal reflux disease)    Heart murmur    History of blood transfusion    History of bronchitis    History of pneumonia    Hyperlipidemia 03/26/2015   Hypertension associated with diabetes ([place]) 11/18/2014   Hypertensive heart/renal disease with failure ([place]) 12/09/2014   Hypokalemia 11/17/2014   Multiple falls 01/21/2015   Obesity 11/17/2014   Onychomycosis 10/07/2016   Right leg DVT ([place]) 12/05/2015   SIRS (systemic inflammatory response syndrome) ([place]) 08/08/2016   Sleep apnea    Unstable angina ([place]) 12/26/2017   Ventricular tachycardia ([place])     Past Surgical History:  Procedure Laterality Date   A/V FISTULAGRAM N/A 01/03/2020   Procedure: A/V FISTULAGRAM;  Surgeon: Serafina Mitchell, MD;  Location: [facility];  Service: Cardiovascular;  Laterality: N/A;   A/V FISTULAGRAM Left 03/12/2021   Procedure: A/V FISTULAGRAM;  Surgeon: Serafina Mitchell, MD;  Location: [facility];  Service: Cardiovascular;  Laterality: Left;   ABDOMINAL HYSTERECTOMY     ANGIOPLASTY Left 01/26/2020   Procedure: LEFT ARM GRAPH THROMBECTOMY WITH BALLON ANGIOPLASTY;  Surgeon: Waynetta Sandy, MD;  Location: [facility];  Service: Vascular;  Laterality: Left;   ANKLE CLOSED REDUCTION N/A 11/17/2014   Procedure: CLOSED REDUCTION ANKLE;  Surgeon: Earlie Server, MD;   Location: [facility];  Service: Orthopedics;  Laterality: N/A;   ANKLE FUSION Left 03/16/2015   Procedure: Left Tibiocalcaneal Fusion;  Surgeon: Newt Minion, MD;  Location: [facility];  Service: Orthopedics;  Laterality: Left;   APPLICATION OF WOUND VAC Right 08/06/2016   Procedure: APPLICATION OF PREVENA WOUND VAC;  Surgeon: Newt Minion, MD;  Location: [facility];  Service: Orthopedics;  Laterality: Right;   AV FISTULA PLACEMENT Left [id number]   done at [facility]     2 stent    CHOLECYSTECTOMY     CORONARY STENT PLACEMENT     FISTULOGRAM Left 01/26/2020   Procedure: FISTULOGRAM;  Surgeon: Waynetta Sandy, MD;  Location: [facility];  Service: Vascular;  Laterality: Left;   FOOT ARTHRODESIS Right 08/06/2016   Procedure: Right Foot Fusion Lisfranc Joint;  Surgeon: Newt Minion, MD;  Location: [facility];  Service: Orthopedics;  Laterality: Right;   HARDWARE REMOVAL Left 03/16/2015   Procedure: Removal Hardware Left Ankle;  Surgeon: Newt Minion, MD;  Location: [facility];  Service: Orthopedics;  Laterality: Left;   INTRAVASCULAR PRESSURE WIRE/FFR STUDY N/A 07/31/2022  Procedure: INTRAVASCULAR PRESSURE WIRE/FFR STUDY;  Surgeon: Early Osmond, MD;  Location: [facility];  Service: Cardiovascular;  Laterality: N/A;   IR AV DIALY SHUNT INTRO NEEDLE/INTRACATH INITIAL W/PTA/IMG LEFT  01/05/2018   IR US GUIDE VASC ACCESS LEFT  01/05/2018   LEFT HEART CATH AND CORONARY ANGIOGRAPHY N/A 12/28/2017   Procedure: LEFT HEART CATH AND CORONARY ANGIOGRAPHY;  Surgeon: Burnell Blanks, MD;  Location: [facility];  Service: Cardiovascular;  Laterality: N/A;   LEFT HEART CATH AND CORONARY ANGIOGRAPHY N/A 07/31/2022   Procedure: LEFT HEART CATH AND CORONARY ANGIOGRAPHY;  Surgeon: Early Osmond, MD;  Location: [facility];  Service: Cardiovascular;  Laterality: N/A;   LOWER EXTREMITY ANGIOGRAPHY N/A 03/21/2021   Procedure: LOWER EXTREMITY ANGIOGRAPHY;  Surgeon: Marty Heck, MD;  Location: [facility];  Service: Cardiovascular;  Laterality: N/A;   ORIF ANKLE FRACTURE Left 11/20/2014   Procedure: OPEN REDUCTION INTERNAL FIXATION (ORIF) ANKLE FRACTURE;  Surgeon: Renette Butters, MD;  Location: [facility];  Service: Orthopedics;  Laterality: Left;   PERIPHERAL VASCULAR BALLOON ANGIOPLASTY  01/03/2020   Procedure: PERIPHERAL VASCULAR BALLOON ANGIOPLASTY;  Surgeon: Serafina Mitchell, MD;  Location: [facility];  Service: Cardiovascular;;   PERIPHERAL VASCULAR BALLOON ANGIOPLASTY Left 03/12/2021   Procedure: PERIPHERAL VASCULAR BALLOON ANGIOPLASTY;  Surgeon: Serafina Mitchell, MD;  Location: [facility];  Service: Cardiovascular;  Laterality: Left;   PERIPHERAL VASCULAR BALLOON ANGIOPLASTY  08/22/2021   Procedure: PERIPHERAL VASCULAR BALLOON ANGIOPLASTY;  Surgeon: Marty Heck, MD;  Location: [facility];  Service: Cardiovascular;;  Left AVF PTA   TONSILLECTOMY      Inpatient Medications: Scheduled Meds:  Continuous Infusions:  PRN Meds:   Allergies:   No Known Allergies  Social History:   Social History   Socioeconomic History   Marital status: Divorced    Spouse name: Not on file   Number of children: Not on file   Years of education: Not on file   Highest education level: Not on file  Occupational History   Not on file  Tobacco Use   Smoking status: Every Day    Packs/day: 1    Types: Cigarettes   Smokeless tobacco: Never  Vaping Use   Vaping Use: Never used  Substance and Sexual Activity   Alcohol use: No   Drug use: No   Sexual activity: Never  Other Topics Concern   Not on file  Social History Narrative   Not on file   Social Determinants of Health   Financial Resource Strain: Not on file  Food Insecurity: No Food Insecurity (07/29/2022)   Hunger Vital Sign    Worried About Running Out of Food in the Last Year: Never true    Ran Out of Food in the Last Year: Never true  Transportation Needs:  No Transportation Needs (07/29/2022)   PRAPARE - Hydrologist (Medical): No    Lack of Transportation (Non-Medical): No  Physical Activity: Not on file  Stress: Not on file  Social Connections: Not on file  Intimate Partner Violence: Not At Risk (07/29/2022)   Humiliation, Afraid, Rape, and Kick questionnaire    Fear of Current or Ex-Partner: No    Emotionally Abused: No    Physically Abused: No    Sexually Abused: No    Family History:    Family History  Family history unknown: Yes     ROS:  Please  see the history of present illness.  All other ROS reviewed and negative.     Physical Exam/Data:   Vitals:   08/07/22 1300 08/07/22 1430 08/07/22 1530 08/07/22 1600  BP: (!) 166/88 (!) 163/78 (!) 142/91 (!) 141/85  Pulse: 70 75 71 71  Resp: '12 12 14 10  '$ Temp:      TempSrc:      SpO2: 100% 97% 97% 97%  Weight:      Height:       No intake or output data in the 24 hours ending 08/07/22 1658    08/07/2022    9:28 AM 08/05/2022    1:00 PM 08/04/2022    7:53 AM  Last 3 Weights  Weight (lbs) 138 lb 14.2 oz 138 lb 14.2 oz 195 lb 8.8 oz  Weight (kg) 63 kg 63 kg 88.7 kg     Body mass index is 23.84 kg/m.  General:  Appears uncomfortable in bed and lying crooked. States 10/10 back pain  HEENT: normal Neck: no JVD Vascular: No carotid bruits; Distal pulses 2+ bilaterally Cardiac:  normal S1, S2; irregular with occasional pauses and rhythm. Appeared to be be coming in and out of Afib with rates jumping to 110.  Lungs:  clear to auscultation bilaterally, no wheezing, rhonchi or rales  Abd: soft, nontender, no hepatomegaly  Ext: no edema Musculoskeletal:  No deformities, BUE and BLE strength normal and equal Skin: warm and dry  Neuro:  CNs 2-12 intact, no focal abnormalities noted Psych:  Normal affect   EKG:  The EKG was personally reviewed and demonstrates:  NSR with sinus arrhythmia, left axis deviation, some T wave inversions in V1 and occasional  PVCs Telemetry:  Telemetry was personally reviewed and demonstrates:  NSR with occasional PAC/PVC, brief wandering atrial pacemaker, 4-8 beats of nonsustained Vtach   Relevant CV Studies:  Heart catheterization 07/31/2022   Ost LAD to Mid LAD lesion is 60% stenosed.   Ost Cx to Prox Cx lesion is 65% stenosed.   Prox Cx to Mid Cx lesion is 20% stenosed.   Prox RCA lesion is 20% stenosed.   Ost Ramus to Ramus lesion is 20% stenosed.   Ost 1st Diag lesion is 50% stenosed.   Ost 2nd Diag lesion is 70% stenosed.   Mid RCA lesion is 60% stenosed.   Mid LAD lesion is 40% stenosed.   1.  Patent ramus stent with minimal in-stent restenosis. 2.  Moderate proximal to mid LAD lesion with an RFR of 0.95. 3.  Moderate ostial left circumflex lesion with an RFR of 1.0. 4.  Moderate mid right coronary artery lesion with an RFR of 0.92. 5.  LVEDP of 6 mmHg.  Echocardiogram 07/29/2022 Left Ventricle: Left ventricular ejection fraction, by estimation, is 45  to 50%. The left ventricle has mildly decreased function. The left  ventricle demonstrates global hypokinesis. The left ventricular internal  cavity size was normal in size. There is   mild concentric left ventricular hypertrophy. Left ventricular diastolic  parameters are consistent with Grade II diastolic dysfunction  (pseudonormalization).   Right Ventricle: The right ventricular size is mildly enlarged. No  increase in right ventricular wall thickness. Right ventricular systolic  function is mildly reduced. There is mildly elevated pulmonary artery  systolic pressure. The tricuspid regurgitant   velocity is 2.55 m/s, and with an assumed right atrial pressure of 15  mmHg, the estimated right ventricular systolic pressure is [id number] mmHg.   Left Atrium: Left atrial size was normal  in size.   Right Atrium: Right atrial size was normal in size.   Pericardium: There is no evidence of pericardial effusion.   Mitral Valve: The mitral valve is  grossly normal. Trivial mitral valve  regurgitation. No evidence of mitral valve stenosis.   Tricuspid Valve: The tricuspid valve is grossly normal. Tricuspid valve  regurgitation is trivial. No evidence of tricuspid stenosis.   Aortic Valve: The aortic valve is tricuspid. Aortic valve regurgitation is  not visualized. Aortic valve sclerosis is present, with no evidence of  aortic valve stenosis.   Pulmonic Valve: The pulmonic valve was grossly normal. Pulmonic valve  regurgitation is trivial. No evidence of pulmonic stenosis.   Aorta: The aortic root and ascending aorta are structurally normal, with  no evidence of dilitation.   Venous: The inferior vena cava is dilated in size with less than 50%  respiratory variability, suggesting right atrial pressure of 15 mmHg.   IAS/Shunts: The atrial septum is grossly normal.   Laboratory Data:  High Sensitivity Troponin:   Recent Labs  Lab 07/29/22 0443 07/29/22 0658 07/31/22 0944 08/07/22 0957 08/07/22 1508  TROPONINIHS 200* 167* 81* 54* 50*     Chemistry Recent Labs  Lab 07/31/22 2039 08/01/22 0302 08/02/22 0739 08/05/22 0149 08/07/22 0957  NA  --    < > 135 134* 135  K  --    < > 3.8 3.7 3.9  CL  --    < > 96* 97* 95*  CO2  --    < > '24 27 28  '$ GLUCOSE  --    < > 273* 200* 135*  BUN  --    < > 31* 28* 22  CREATININE  --    < > 4.16* 3.82* 3.74*  CALCIUM  --    < > 9.1 9.2 9.8  MG 2.2  --   --   --   --   GFRNONAA  --    < > 11* 12* 12*  ANIONGAP  --    < > '15 10 12   '$ < > = values in this interval not displayed.    Recent Labs  Lab 08/01/22 0302  ALBUMIN 3.1*   Lipids  Recent Labs  Lab 08/01/22 0302  CHOL 138  TRIG 192*  HDL 60  LDLCALC 40  CHOLHDL 2.3    Hematology Recent Labs  Lab 08/02/22 0621 08/05/22 0149 08/07/22 0957  WBC 5.9 5.2 6.3  RBC 4.16 3.14* 3.40*  HGB 12.4 9.6* 10.3*  HCT 39.4 29.7* 33.0*  MCV 94.7 94.6 97.1  MCH 29.8 30.6 30.3  MCHC 31.5 32.3 31.2  RDW 19.9* 19.7* 20.2*  PLT  120* 133* 171   Thyroid No results for input(s): "TSH", "FREET4" in the last 168 hours.  BNPNo results for input(s): "BNP", "PROBNP" in the last 168 hours.  DDimer No results for input(s): "DDIMER" in the last 168 hours.   Radiology/Studies:  DG Chest 2 View  Result Date: 08/07/2022 CLINICAL DATA:  Per triage notes: Pt c/o midsternal chest pain that radiates into upper back started yesterday. Pt c/o nausea and SOB. Pt denies vomiting Hx of CAD, HTN, PNA, heart murmur Diabetic, former smoker EXAM: CHEST - 2 VIEW COMPARISON:  07/29/2022 FINDINGS: Lungs are clear. Stable mild cardiomegaly.  Aortic Atherosclerosis (ICD10-170.0). No effusion.  No pneumothorax.  Left brachial vascular stent stable. Visualized bones unremarkable. IMPRESSION: No acute cardiopulmonary disease. Electronically Signed   By: Lucrezia Europe M.D.   On: 08/07/2022 10:13  DG Thoracic Spine 2 View  Result Date: 08/07/2022 CLINICAL DATA:  Upper back and mid sternal chest pain 1 EXAM: THORACIC SPINE 2 VIEWS COMPARISON:  08/01/2022 FINDINGS: There is no evidence of thoracic spine fracture. Stable mild thoracolumbar dextroscoliosis apex T11 without evident underlying vertebral anomaly. No evident spondylolisthesis. No other significant bone abnormalities are identified. Aortic Atherosclerosis (ICD10-170.0). IMPRESSION: 1. No acute findings. 2. Mild thoracolumbar dextroscoliosis. Electronically Signed   By: Lucrezia Europe M.D.   On: 08/07/2022 10:11     Assessment and Plan:   Back pain radiating to the chest CAD s/p PCI to proximal circumflex in 2016 with recent admission for NSTEMI 07/29/22 Atherosclerotic calcifications aorta  Hyperlipidemia Patient with previous known history of non obstructing disease in LAD with heavily calcified circumflex which was being managed medically due to difficult PCI with calcification and prior stent in nearby area who recently underwent another cath last admission with same plan for medical management.   It appears her main concern is her back pain that seems musculoskeletal in nature considering her immobile state, it is sharp, comes in waves like a spasm, and has been going on for 3 weeks prior to last admission due to a fall.  Has tenderness to palpation of the midline lumbar spine without deformities noted. Xray of the spine shows no fractures. She has lidocaine patch and voltaren gel that proved to be beneficial prior admission  Trops are mildly elevated at 54->50 but less than recent admission of 200. EKG is abnormal but appears stable from recent admission. Additionally recent cath did not show significant changes from prior study in 2019.  - CT of the chest has been ordered to rule out aortic dissection, neg.  - continue medical management with Plavix 75 mg daily, Imdur 30 mg daily, atorvastatin '80mg'$  daily.  - will discuss plan with MD to follow  Chronic combined systolic and diastolic heart failure NYHA III HTN Echo shows EF 45-50% with global hypokinesis of the LV w/ G2DD. GDMT limited by ESRD. No signs of volume overload on exam  - metoprolol succinate 25 mg - previously amlodpine was held due to hypotension on admission. Given elevated BP now may be able to integrate more GDMT if BP tolerates.   Paroxysmal A-fib  DVT and PE PACs, PVCs, Wandering Atrial Pacemaker, NSVT, prolonged QT, Patient on eliquis 2.'5mg'$  BID at home reduced dose due to ESRD. On reduced dosing due to h/o bleeding issues.  Predominantly maintaining NSR, rare ectopy, 2 brief episodes NSVT (4, 8 beats), + wandering atrial pacemaker - no recurrent afib seen - can consider titration of BB if able for NSVT - electrolytes managed with dialysis avoid additional QT prolonged agents.   ESRD  Cr appears to be baseline and under goes routine HD and is compliant.  - MWF  DM2 PAD - defer to primary  Sent a message to our office to get her scheduled for follow up appointment.*    Risk Assessment/Risk Scores:   TIMI  Risk Score for Unstable Angina or Non-ST Elevation MI:   The patient's TIMI risk score is 6, which indicates a 41% risk of all cause mortality, new or recurrent myocardial infarction or need for urgent revascularization in the next 14 days.{   New York Heart Association (NYHA) Functional Class NYHA Class III  CHA2DS2-VASc Score = 6  This indicates a 9.7% annual risk of stroke. The patient's score is based upon: CHF History: 1 HTN History: 1 Diabetes History: 1 Stroke History: 0 Vascular  Disease History: 1 Age Score: 1 Gender Score: 1      For questions or updates, please contact [facility] Please consult [email or web address] for contact info under    Signed, Bonnee Quin, PA-C  08/07/2022 4:58 PM   Agree with above.  72 year old with recent cardiac catheterization resulting in medical management with end-stage renal disease coming in with back discomfort.  See above note for full details.  As I went down to go see her, the nurse let me know that she had just left AMA.  Ultimately, social determinants of health play a huge role in her recurring ER visits.  It would be optimal for her to have a close follow-up with primary care in the next 7 days to help alleviate this burden is much as possible.  I do not believe that she was having an acute cardiac event.  Thankfully, no evidence of dissection on CT scan.  Cardiac troponin was 50 trending downward.  This is commonly seen in the setting of end-stage renal disease.  Previously prior her catheterization it was much higher.  Routine cardiology follow-up has been scheduled.  Candee Furbish, MD

## 2022-08-07 NOTE — ED Triage Notes (Signed)
Pt c/o midsternal chest pain that radiates into upper back started yesterday. Pt c/o nausea and SOB. Pt denies vomiting

## 2022-08-07 NOTE — ED Provider Notes (Signed)
[facility] Provider Note   CSN: [id number] Arrival date & time: 08/07/22  0913     History  Chief Complaint  Patient presents with   Back Pain   Chest Pain    Robin [name] is a 72 y.o. female.  HPI    72 year old female with a history of ESRD on dialysis, paroxysmal atrial fibrillation, remote PE/DVT, coronary artery disease status post PCI, NSTEMI earlier this month, lower extremity necrotizing infection status post amputation chronic combined systolic and diastolic heart failure, dementia, bipolar, COPD, type 2 diabetes, peripheral vascular disease status post stenting, hypertension, hyperlipidemia recent admission for chest pain who presents with concern for back p ain radiating to the chest.   Some pain since last admission Pain radiating from back ot chest Pain hit her as she was getting back into bed last night No recent fall (had prior to last admission) Aching pain, nothing making it better, constantly there Was able to get herself ready Is worse with movement, twisting Nausea  Is having shortness of breath has had shortness of breath continued since in the hospital. New prior to last admission/   Pain comes from the back to the front When heart beats it hurts  No longer makes urine Had dialysis yesterday     Ost LAD to Mid LAD lesion is 60% stenosed.   Ost Cx to Prox Cx lesion is 65% stenosed.   Prox Cx to Mid Cx lesion is 20% stenosed.   Prox RCA lesion is 20% stenosed.   Ost Ramus to Ramus lesion is 20% stenosed.   Ost 1st Diag lesion is 50% stenosed.   Ost 2nd Diag lesion is 70% stenosed.   Mid RCA lesion is 60% stenosed.   Mid LAD lesion is 40% stenosed.   1.  Patent ramus stent with minimal in-stent restenosis. 2.  Moderate proximal to mid LAD lesion with an RFR of 0.95. 3.  Moderate ostial left circumflex lesion with an RFR of 1.0. 4.  Moderate mid right coronary artery lesion with an RFR of 0.92. 5.   LVEDP of 6 mmHg.  Past Medical History:  Diagnosis Date   Acute delirium 11/18/2014   Acute encephalopathy    Acute on chronic respiratory failure with hypoxia (HCC) 09/09/2016   Acute respiratory failure with hypoxia (HCC) 11/29/2015   Anemia in chronic kidney disease 12/09/2014   Anxiety    Arthritis    Benign hypertension    Bipolar affective disorder ([place]) 12/09/2014   CAD (coronary artery disease), native coronary artery with 2 stents  11/17/2014   Charcot foot due to diabetes mellitus ([place])    Chronic combined systolic and diastolic CHF (congestive heart failure) ([place]) 11/18/2014   Closed left ankle fracture 11/17/2014   Confusion 01/21/2015   Depression    Diabetes mellitus without complication ([place])    End stage renal disease on dialysis ([place])    Fracture dislocation of ankle 11/17/2014   GERD (gastroesophageal reflux disease)    Heart murmur    History of blood transfusion    History of bronchitis    History of pneumonia    Hyperlipidemia 03/26/2015   Hypertension associated with diabetes ([place]) 11/18/2014   Hypertensive heart/renal disease with failure ([place]) 12/09/2014   Hypokalemia 11/17/2014   Multiple falls 01/21/2015   Obesity 11/17/2014   Onychomycosis 10/07/2016   Right leg DVT ([place]) 12/05/2015   SIRS (systemic inflammatory response syndrome) ([place]) 08/08/2016   Sleep apnea  Unstable angina ([employer]) 12/26/2017   Ventricular tachycardia ([place])      Home Medications Prior to Admission medications   Medication Sig Start Date End Date Taking? Authorizing Provider  acetaminophen (TYLENOL) 325 MG tablet Take 975 mg by mouth 3 (three) times a week.    [provider]  albuterol (VENTOLIN HFA) 108 (90 Base) MCG/ACT inhaler Inhale 1-2 puffs into the lungs every 6 (six) hours as needed for wheezing or shortness of breath.    [provider]  apixaban (ELIQUIS) 5 MG TABS tablet Take 2.5 mg by mouth 2 (two) times daily with a meal.    [provider]  ARIPiprazole  (ABILIFY) 2 MG tablet Take 6 tablets (12 mg total) by mouth at bedtime. 08/05/22 09/04/22  Angelique Blonder, DO  atorvastatin (LIPITOR) 80 MG tablet Take 1 tablet (80 mg total) by mouth at bedtime. 08/05/22   Angelique Blonder, DO  CALCIUM PO Take 1 tablet by mouth daily.    [provider]  clopidogrel (PLAVIX) 75 MG tablet Take 1 tablet (75 mg total) by mouth daily. 08/06/22   Angelique Blonder, DO  diclofenac Sodium (VOLTAREN) 1 % GEL Apply 4 g topically 4 (four) times daily as needed (for back pain). 08/05/22   Angelique Blonder, DO  ferric citrate (AURYXIA) 1 GM 210 MG(Fe) tablet Take 420 mg by mouth 2 (two) times daily with a meal.    [provider]  gabapentin (NEURONTIN) 100 MG capsule Take 100 mg by mouth 2 (two) times daily.    [provider]  insulin aspart (NOVOLOG) 100 UNIT/ML injection Inject 6 Units into the skin 3 (three) times daily as needed for high blood sugar (CBG >300).    [provider]  insulin detemir (LEVEMIR FLEXTOUCH) 100 UNIT/ML FlexPen Inject 18 Units into the skin daily. 08/05/22   Angelique Blonder, DO  isosorbide mononitrate (IMDUR) 30 MG 24 hr tablet Take 1 tablet (30 mg total) by mouth daily with breakfast. 08/06/22   Angelique Blonder, DO  Melatonin 10 MG TABS Take 10 mg by mouth at bedtime.    [provider]  metoprolol succinate (TOPROL-XL) 25 MG 24 hr tablet Take 1 tablet (25 mg total) by mouth daily. 08/06/22   Angelique Blonder, DO  multivitamin (RENA-VIT) TABS tablet Take 1 tablet by mouth at bedtime.  12/11/17   [provider]  nitroGLYCERIN (NITROSTAT) 0.4 MG SL tablet Place 0.4 mg under the tongue every 5 (five) minutes as needed for chest pain.    [provider]  pantoprazole (PROTONIX) 40 MG tablet Take 40 mg by mouth 2 (two) times daily before a meal.    [provider]  Tiotropium Bromide Monohydrate (SPIRIVA RESPIMAT) 1.25 MCG/ACT AERS Inhale 2 puffs into the lungs every morning.    [provider]      Allergies    Patient has no known allergies.    Review of Systems   Review of Systems  Physical Exam Updated Vital Signs BP (!) 170/80   Pulse 71   Temp (!) 97.5 F (36.4 C) (Oral)   Resp 10   Ht 5\' 4"  (1.626 m)   Wt 63 kg   SpO2 97%   BMI 23.84 kg/m  Physical Exam Constitutional:      General: She is not in acute distress.    Appearance: Normal appearance. She is not ill-appearing or diaphoretic.  HENT:     Head: Normocephalic and atraumatic.  Eyes:     Extraocular Movements: Extraocular  movements intact.     Conjunctiva/sclera: Conjunctivae normal.  Neck:     Vascular: No JVD.  Cardiovascular:     Rate and Rhythm: Normal rate and regular rhythm.     Pulses: Normal pulses.     Heart sounds: Normal heart sounds. No murmur heard.    No friction rub. No gallop.  Pulmonary:     Effort: Pulmonary effort is normal. No respiratory distress.     Breath sounds: Normal breath sounds. No wheezing, rhonchi or rales.  Chest:     Chest wall: No tenderness.  Abdominal:     General: Abdomen is flat. There is no distension.     Palpations: Abdomen is soft.  Musculoskeletal:     Right lower leg: No edema.     Left lower leg: No edema.  Skin:    General: Skin is warm and dry.     Findings: No erythema.  Neurological:     Mental Status: She is alert and oriented to person, place, and time.     ED Results / Procedures / Treatments   Labs (all labs ordered are listed, but only abnormal results are displayed) Labs Reviewed  BASIC METABOLIC PANEL - Abnormal; Notable for the following components:      Result Value   Chloride 95 (*)    Glucose, Bld 135 (*)    Creatinine, Ser 3.74 (*)    GFR, Estimated 12 (*)    All other components within normal limits  CBC - Abnormal; Notable for the following components:   RBC 3.40 (*)    Hemoglobin 10.3 (*)    HCT 33.0 (*)    RDW 20.2 (*)    All other components within normal limits  TROPONIN I (HIGH SENSITIVITY) - Abnormal;  Notable for the following components:   Troponin I (High Sensitivity) 54 (*)    All other components within normal limits  TROPONIN I (HIGH SENSITIVITY) - Abnormal; Notable for the following components:   Troponin I (High Sensitivity) 50 (*)    All other components within normal limits    EKG EKG Interpretation  Date/Time:  Thursday August 07 2022 09:35:05 EDT Ventricular Rate:  74 PR Interval:  172 QRS Duration: 90 QT Interval:  468 QTC Calculation: 519 R Axis:   -51 Text Interpretation: Sinus rhythm with sinus arrhythmia with occasional Premature ventricular complexes Left axis deviation Left ventricular hypertrophy with repolarization abnormality ( R in aVL , Cornell product ) Prolonged QT Abnormal ECG When compared with ECG of 31-Jul-2022 13:26, No significant change since last tracing Confirmed by Gareth Morgan [phone]) on 08/07/2022 11:39:30 AM  Radiology CT Angio Chest/Abd/Pel for Dissection W and/or Wo Contrast  Result Date: 08/07/2022 CLINICAL DATA:  Acute mid sternal chest pain radiating into upper back since yesterday, nausea, shortness of breath, suspected acute aortic syndrome pulmonary arteries patent. No evidence of pulmonary embolism. EXAM: CT ANGIOGRAPHY CHEST, ABDOMEN AND PELVIS TECHNIQUE: Base of cervical region unremarkable. Esophagus normal appearance. Normal sized para-aortic lymph node 8 mm short axis. No thoracic adenopathy. Multidetector CT imaging through the chest, abdomen and pelvis was performed using the standard protocol during bolus administration of intravenous contrast. Multiplanar reconstructed images and MIPs were obtained and reviewed to evaluate the vascular anatomy. RADIATION DOSE REDUCTION: This exam was performed according to the departmental dose-optimization program which includes automated exposure control, adjustment of the mA and/or kV according to patient size and/or use of iterative reconstruction technique. CONTRAST:  154mL OMNIPAQUE IOHEXOL 350  MG/ML SOLN COMPARISON:  None available FINDINGS: CTA CHEST FINDINGS Cardiovascular: Atherosclerotic calcifications aorta, coronary arteries, and proximal great vessels. No intramural hematoma on precontrast imaging. Normal aortic enhancement without aneurysm or dissection. Pulmonary arteries patent. Slight enlargement of cardiac chambers. No pericardial effusion. Mediastinum/Nodes: Esophagus unremarkable. Normal sized 8 mm short axis para-aortic lymph node adjacent to aortic arch. No thoracic adenopathy. Base of cervical region normal appearance. Lungs/Pleura: Subsegmental atelectasis LEFT lower lobe. Remaining lungs clear. No pulmonary infiltrate, pleural effusion, or pneumothorax. Musculoskeletal: Osseous demineralization. Mild superior endplate deformities of multiple thoracic vertebra, age indeterminate. Minimal sclerosis at mid sternum from old healed fracture. Review of the MIP images confirms the above findings. CTA ABDOMEN AND PELVIS FINDINGS VASCULAR Aorta: Atherosclerotic calcifications without aneurysm or dissection. Celiac: Mild plaque at origin, less than 50% narrowing. SMA: Widely patent.  Scattered plaque at distal SMA. Renals: BILATERAL renal origins widely patent IMA: Plaque at origin without significant narrowing Inflow: Scattered atherosclerotic plaques.  No high-grade stenoses. Veins: Unopacified at time of CTA imaging Review of the MIP images confirms the above findings. NON-VASCULAR Hepatobiliary: Gallbladder surgically absent. Liver normal appearance. Pancreas: Normal appearance Spleen: Normal appearance Adrenals/Urinary Tract: Cyst at upper pole RIGHT kidney 3.1 x 2.7 cm; no follow-up imaging recommended. BILATERAL renal cortical atrophy. No additional renal mass, hydronephrosis, hydroureter, or urinary tract calcification. Bladder unremarkable. Stomach/Bowel: Scattered stool throughout colon. Appendix not visualized. Stomach and bowel loops normal appearance Lymphatic: No adenopathy  Reproductive: Uterus surgically absent with nonvisualization of ovaries Other: No free air or free fluid. No hernia or inflammatory process. Musculoskeletal: Osseous demineralization. No acute osseous findings. Review of the MIP images confirms the above findings. IMPRESSION: No evidence of aortic aneurysm or dissection. Scattered atherosclerotic plaques including coronary arteries. BILATERAL renal cortical atrophy. Subsegmental atelectasis LEFT lower lobe. No acute intra-abdominal or intrapelvic abnormalities. Mild superior endplate deformities of multiple thoracic vertebra, age indeterminate. Aortic Atherosclerosis (ICD10-I70.0). Electronically Signed   By: Lavonia Dana M.D.   On: 08/07/2022 17:39   DG Chest 2 View  Result Date: 08/07/2022 CLINICAL DATA:  Per triage notes: Pt c/o midsternal chest pain that radiates into upper back started yesterday. Pt c/o nausea and SOB. Pt denies vomiting Hx of CAD, HTN, PNA, heart murmur Diabetic, former smoker EXAM: CHEST - 2 VIEW COMPARISON:  07/29/2022 FINDINGS: Lungs are clear. Stable mild cardiomegaly.  Aortic Atherosclerosis (ICD10-170.0). No effusion.  No pneumothorax.  Left brachial vascular stent stable. Visualized bones unremarkable. IMPRESSION: No acute cardiopulmonary disease. Electronically Signed   By: Lucrezia Europe M.D.   On: 08/07/2022 10:13   DG Thoracic Spine 2 View  Result Date: 08/07/2022 CLINICAL DATA:  Upper back and mid sternal chest pain 1 EXAM: THORACIC SPINE 2 VIEWS COMPARISON:  08/01/2022 FINDINGS: There is no evidence of thoracic spine fracture. Stable mild thoracolumbar dextroscoliosis apex T11 without evident underlying vertebral anomaly. No evident spondylolisthesis. No other significant bone abnormalities are identified. Aortic Atherosclerosis (ICD10-170.0). IMPRESSION: 1. No acute findings. 2. Mild thoracolumbar dextroscoliosis. Electronically Signed   By: Lucrezia Europe M.D.   On: 08/07/2022 10:11    Procedures Procedures    Medications  Ordered in ED Medications  fentaNYL (SUBLIMAZE) injection 50 mcg (50 mcg Intravenous Given 08/07/22 1644)  iohexol (OMNIPAQUE) 350 MG/ML injection 100 mL (100 mLs Intravenous Contrast Given 08/07/22 1714)    ED Course/ Medical Decision Making/ A&P Clinical Course as of 08/09/22 0639  Thu Aug 07, 2022  1513 72 F with back pain radiating to chest. Had recent cath and found to have multivessel disease  recommended medical management. Trop 54 downtrended from prior. CTA dissection and repeat troponin pending. Cardiology recs pending. [VB]  [id number] Patient's repeat troponin down trended to 50.  CTA dissection negative for dissection.  Incidental findings as listed below.  Old endplate deformities of thoracic spine likely contributing to back pain.  IMPRESSION: No evidence of aortic aneurysm or dissection.  Scattered atherosclerotic plaques including coronary arteries.  BILATERAL renal cortical atrophy.  Subsegmental atelectasis LEFT lower lobe.  No acute intra-abdominal or intrapelvic abnormalities.  Mild superior endplate deformities of multiple thoracic vertebra, age indeterminate.   [VB]  1745 Pending cards consult. [VB]  1911 I have repaged cardiology waiting on recommendations.  According to nursing staff there has been a PA from cardiology down to see patient.  Note is pending. [VB]  2014 I have spoken to Cardiology PA regarding patient, pending Dr Marlou Porch of cardiology to evaluate patient final recs. [VB]  2038 Dr Marlou Porch of cardiology was on his way to see patient however patient had left AGAINST MEDICAL ADVICE.  She did sign paperwork with nursing staff.  Cardiology did send notice for follow-up to be scheduled with patient. [VB]    Clinical Course User Index [VB] Elgie Congo, MD                              73 year old female with a history of ESRD on dialysis, paroxysmal atrial fibrillation, remote PE/DVT, coronary artery disease status post PCI, NSTEMI earlier this month,  lower extremity necrotizing infection status post amputation chronic combined systolic and diastolic heart failure, dementia, bipolar, COPD, type 2 diabetes, peripheral vascular disease status post stenting, hypertension, hyperlipidemia recent admission for chest pain who presents with concern for back pain radiating to the chest.   Differential diagnosis for chest pain includes pulmonary embolus, dissection, pneumothorax, pneumonia, ACS, myocarditis, pericarditis.  EKG was done and evaluate by me and showed no acute ST changes and no signs of pericarditis. Chest x-ray was done and evaluated by me and radiology and showed no sign of pneumonia or pneumothorax.  Delta troponins in 50s, less than most recent admission, and similar to priors in hx of ESRD.  Given pain stabbing at times, back pain to chest, have ordered CTA dissection study which is pending.  Cardiology has also been consulted in setting of diffuse cardiac disease.  Care signed out to Dr. Nechama Guard.          Final Clinical Impression(s) / ED Diagnoses Final diagnoses:  Atypical chest pain    Rx / DC Orders ED Discharge Orders     None         Gareth Morgan, MD 08/09/22 [id number]

## 2022-08-20 ENCOUNTER — Encounter: Payer: Self-pay | Admitting: Cardiovascular Disease

## 2022-08-20 ENCOUNTER — Telehealth: Payer: Self-pay | Admitting: Cardiovascular Disease

## 2022-08-20 NOTE — Telephone Encounter (Signed)
Dunn, Dayna N, PA-C  P Cv Div Nl Scheduling Hi scheduling team! This is Dayna, one of the PAs with our [facility] team. Our team saw this patient in the hospital and the MD recommended arranging a follow-up visit.  Please schedule this patient for a post-hospital follow-up appointment and call them with that information:  Primary Cardiologist: Dr. Claiborne Billings Appointment Needed Within: 3-4 weeks Appointment Type: post hospital f/u  Thank you for all you do!  Dayna Dunn PA-C    LVM 3x with no success in scheduling, sending letter

## 2022-09-09 ENCOUNTER — Other Ambulatory Visit: Payer: Self-pay | Admitting: *Deleted

## 2022-09-09 DIAGNOSIS — I739 Peripheral vascular disease, unspecified: Secondary | ICD-10-CM

## 2022-09-16 ENCOUNTER — Ambulatory Visit: Payer: No Typology Code available for payment source | Admitting: Vascular Surgery

## 2022-09-16 ENCOUNTER — Ambulatory Visit (HOSPITAL_COMMUNITY): Payer: No Typology Code available for payment source | Attending: Vascular Surgery

## 2022-10-17 ENCOUNTER — Emergency Department (HOSPITAL_COMMUNITY): Payer: No Typology Code available for payment source

## 2022-10-17 ENCOUNTER — Observation Stay (HOSPITAL_COMMUNITY)
Admission: EM | Admit: 2022-10-17 | Discharge: 2022-10-21 | Disposition: A | Discharge status: Home / Self Care | Payer: No Typology Code available for payment source | Attending: Family Medicine | Admitting: Family Medicine

## 2022-10-17 DIAGNOSIS — N186 End stage renal disease: Secondary | ICD-10-CM | POA: Diagnosis present

## 2022-10-17 DIAGNOSIS — Z86718 Personal history of other venous thrombosis and embolism: Secondary | ICD-10-CM | POA: Diagnosis not present

## 2022-10-17 DIAGNOSIS — Z992 Dependence on renal dialysis: Secondary | ICD-10-CM | POA: Diagnosis not present

## 2022-10-17 DIAGNOSIS — R4182 Altered mental status, unspecified: Secondary | ICD-10-CM | POA: Diagnosis present

## 2022-10-17 DIAGNOSIS — E119 Type 2 diabetes mellitus without complications: Secondary | ICD-10-CM

## 2022-10-17 DIAGNOSIS — F17210 Nicotine dependence, cigarettes, uncomplicated: Secondary | ICD-10-CM | POA: Diagnosis not present

## 2022-10-17 DIAGNOSIS — Z79899 Other long term (current) drug therapy: Secondary | ICD-10-CM | POA: Diagnosis not present

## 2022-10-17 DIAGNOSIS — Z794 Long term (current) use of insulin: Secondary | ICD-10-CM | POA: Diagnosis not present

## 2022-10-17 DIAGNOSIS — Z7901 Long term (current) use of anticoagulants: Secondary | ICD-10-CM | POA: Diagnosis not present

## 2022-10-17 DIAGNOSIS — Z955 Presence of coronary angioplasty implant and graft: Secondary | ICD-10-CM | POA: Insufficient documentation

## 2022-10-17 DIAGNOSIS — E871 Hypo-osmolality and hyponatremia: Secondary | ICD-10-CM | POA: Diagnosis not present

## 2022-10-17 DIAGNOSIS — R29818 Other symptoms and signs involving the nervous system: Secondary | ICD-10-CM

## 2022-10-17 DIAGNOSIS — I2510 Atherosclerotic heart disease of native coronary artery without angina pectoris: Secondary | ICD-10-CM | POA: Diagnosis not present

## 2022-10-17 DIAGNOSIS — E1122 Type 2 diabetes mellitus with diabetic chronic kidney disease: Secondary | ICD-10-CM | POA: Insufficient documentation

## 2022-10-17 DIAGNOSIS — Z7902 Long term (current) use of antithrombotics/antiplatelets: Secondary | ICD-10-CM | POA: Diagnosis not present

## 2022-10-17 DIAGNOSIS — N390 Urinary tract infection, site not specified: Secondary | ICD-10-CM

## 2022-10-17 DIAGNOSIS — I132 Hypertensive heart and chronic kidney disease with heart failure and with stage 5 chronic kidney disease, or end stage renal disease: Secondary | ICD-10-CM | POA: Diagnosis not present

## 2022-10-17 DIAGNOSIS — I959 Hypotension, unspecified: Secondary | ICD-10-CM | POA: Diagnosis not present

## 2022-10-17 DIAGNOSIS — E440 Moderate protein-calorie malnutrition: Secondary | ICD-10-CM

## 2022-10-17 DIAGNOSIS — E878 Other disorders of electrolyte and fluid balance, not elsewhere classified: Secondary | ICD-10-CM | POA: Insufficient documentation

## 2022-10-17 DIAGNOSIS — F4320 Adjustment disorder, unspecified: Secondary | ICD-10-CM

## 2022-10-17 DIAGNOSIS — R404 Transient alteration of awareness: Secondary | ICD-10-CM

## 2022-10-17 DIAGNOSIS — I5042 Chronic combined systolic (congestive) and diastolic (congestive) heart failure: Secondary | ICD-10-CM | POA: Diagnosis not present

## 2022-10-17 DIAGNOSIS — T68XXXA Hypothermia, initial encounter: Secondary | ICD-10-CM

## 2022-10-17 DIAGNOSIS — R42 Dizziness and giddiness: Secondary | ICD-10-CM

## 2022-10-17 LAB — COMPREHENSIVE METABOLIC PANEL
ALT: 16 U/L (ref 0–44)
AST: 45 U/L — ABNORMAL HIGH (ref 15–41)
Albumin: 3.1 g/dL — ABNORMAL LOW (ref 3.5–5.0)
Alkaline Phosphatase: 141 U/L — ABNORMAL HIGH (ref 38–126)
Anion gap: 16 — ABNORMAL HIGH (ref 5–15)
BUN: 18 mg/dL (ref 8–23)
CO2: 25 mmol/L (ref 22–32)
Calcium: 9.6 mg/dL (ref 8.9–10.3)
Chloride: 88 mmol/L — ABNORMAL LOW (ref 98–111)
Creatinine, Ser: 5.45 mg/dL — ABNORMAL HIGH (ref 0.44–1.00)
GFR, Estimated: 8 mL/min — ABNORMAL LOW (ref >60)
Glucose, Bld: 108 mg/dL — ABNORMAL HIGH (ref 70–99)
Potassium: 4.2 mmol/L (ref 3.5–5.1)
Sodium: 129 mmol/L — ABNORMAL LOW (ref 135–145)
Total Bilirubin: 1.6 mg/dL — ABNORMAL HIGH (ref 0.3–1.2)
Total Protein: 6.6 g/dL (ref 6.5–8.1)

## 2022-10-17 LAB — CBG MONITORING, ED
Glucose-Capillary: 92 mg/dL (ref 70–99)
Glucose-Capillary: 89 mg/dL (ref 70–99)

## 2022-10-17 LAB — GLUCOSE, CAPILLARY: Glucose-Capillary: 103 mg/dL — ABNORMAL HIGH (ref 70–99)

## 2022-10-17 LAB — CBC WITH DIFFERENTIAL/PLATELET
Abs Immature Granulocytes: 0.05 10*3/uL (ref 0.00–0.07)
Basophils Absolute: 0.1 10*3/uL (ref 0.0–0.1)
Basophils Relative: 1 %
Eosinophils Absolute: 0.2 10*3/uL (ref 0.0–0.5)
Eosinophils Relative: 3 %
HCT: 31.9 % — ABNORMAL LOW (ref 36.0–46.0)
Hemoglobin: 10.2 g/dL — ABNORMAL LOW (ref 12.0–15.0)
Immature Granulocytes: 1 %
Lymphocytes Relative: 18 %
Lymphs Abs: 1.4 10*3/uL (ref 0.7–4.0)
MCH: 32.7 pg (ref 26.0–34.0)
MCHC: 32 g/dL (ref 30.0–36.0)
MCV: 102.2 fL — ABNORMAL HIGH (ref 80.0–100.0)
Monocytes Absolute: 0.7 10*3/uL (ref 0.1–1.0)
Monocytes Relative: 9 %
Neutro Abs: 5.5 10*3/uL (ref 1.7–7.7)
Neutrophils Relative %: 68 %
Platelets: 271 10*3/uL (ref 150–400)
RBC: 3.12 MIL/uL — ABNORMAL LOW (ref 3.87–5.11)
RDW: 17.2 % — ABNORMAL HIGH (ref 11.5–15.5)
WBC: 8 10*3/uL (ref 4.0–10.5)
nRBC: 0 % (ref 0.0–0.2)

## 2022-10-17 LAB — SALICYLATE LEVEL: Salicylate Lvl: <7 mg/dL — ABNORMAL LOW (ref 7.0–30.0)

## 2022-10-17 LAB — URINALYSIS, W/ REFLEX TO CULTURE (INFECTION SUSPECTED)
Bilirubin Urine: NEGATIVE
Glucose, UA: NEGATIVE mg/dL
Ketones, ur: NEGATIVE mg/dL
Nitrite: NEGATIVE
Protein, ur: >=300 mg/dL — AB
RBC / HPF: >50 RBC/hpf (ref 0–5)
Specific Gravity, Urine: 1.012 (ref 1.005–1.030)
WBC, UA: >50 WBC/hpf (ref 0–5)
pH: 8 (ref 5.0–8.0)

## 2022-10-17 LAB — RAPID URINE DRUG SCREEN, HOSP PERFORMED
Amphetamines: NOT DETECTED
Barbiturates: NOT DETECTED
Benzodiazepines: NOT DETECTED
Cocaine: NOT DETECTED
Opiates: NOT DETECTED
Tetrahydrocannabinol: NOT DETECTED

## 2022-10-17 LAB — I-STAT VENOUS BLOOD GAS, ED
Acid-Base Excess: 7 mmol/L — ABNORMAL HIGH (ref 0.0–2.0)
Bicarbonate: 32.1 mmol/L — ABNORMAL HIGH (ref 20.0–28.0)
Calcium, Ion: 1.11 mmol/L — ABNORMAL LOW (ref 1.15–1.40)
HCT: 33 % — ABNORMAL LOW (ref 36.0–46.0)
Hemoglobin: 11.2 g/dL — ABNORMAL LOW (ref 12.0–15.0)
O2 Saturation: 82 %
Potassium: 4.1 mmol/L (ref 3.5–5.1)
Sodium: 128 mmol/L — ABNORMAL LOW (ref 135–145)
TCO2: 33 mmol/L — ABNORMAL HIGH (ref 22–32)
pCO2, Ven: 46.3 mmHg (ref 44–60)
pH, Ven: 7.449 — ABNORMAL HIGH (ref 7.25–7.43)
pO2, Ven: 45 mmHg (ref 32–45)

## 2022-10-17 LAB — VITAMIN B12: Vitamin B-12: 1885 pg/mL — ABNORMAL HIGH (ref 180–914)

## 2022-10-17 LAB — PROTIME-INR
INR: 1.4 — ABNORMAL HIGH (ref 0.8–1.2)
Prothrombin Time: 17.1 seconds — ABNORMAL HIGH (ref 11.4–15.2)

## 2022-10-17 LAB — LACTIC ACID, PLASMA: Lactic Acid, Venous: 1.3 mmol/L (ref 0.5–1.9)

## 2022-10-17 LAB — HIV ANTIBODY (ROUTINE TESTING W REFLEX): HIV Screen 4th Generation wRfx: NONREACTIVE

## 2022-10-17 LAB — ETHANOL: Alcohol, Ethyl (B): <10 mg/dL (ref <10)

## 2022-10-17 LAB — MRSA NEXT GEN BY PCR, NASAL: MRSA by PCR Next Gen: NOT DETECTED

## 2022-10-17 LAB — T4, FREE: Free T4: 1.49 ng/dL — ABNORMAL HIGH (ref 0.61–1.12)

## 2022-10-17 LAB — HEPATITIS B SURFACE ANTIGEN: Hepatitis B Surface Ag: NONREACTIVE

## 2022-10-17 LAB — AMMONIA: Ammonia: 15 umol/L (ref 9–35)

## 2022-10-17 LAB — TSH: TSH: 2.096 u[IU]/mL (ref 0.350–4.500)

## 2022-10-17 LAB — ACETAMINOPHEN LEVEL: Acetaminophen (Tylenol), Serum: <10 ug/mL — ABNORMAL LOW (ref 10–30)

## 2022-10-17 MED ORDER — LACTATED RINGERS IV BOLUS
500.0000 mL | Freq: Once | INTRAVENOUS | Status: AC
  Administered 2022-10-17: 500 mL via INTRAVENOUS

## 2022-10-17 MED ORDER — UMECLIDINIUM BROMIDE 62.5 MCG/ACT IN AEPB
1.0000 | INHALATION_SPRAY | Freq: Every morning | RESPIRATORY_TRACT | Status: DC
  Administered 2022-10-18 – 2022-10-21 (×4): 1 via RESPIRATORY_TRACT
  Filled 2022-10-17 (×2): qty 7

## 2022-10-17 MED ORDER — APIXABAN 2.5 MG PO TABS
2.5000 mg | ORAL_TABLET | Freq: Two times a day (BID) | ORAL | Status: DC
  Administered 2022-10-18 – 2022-10-21 (×6): 2.5 mg via ORAL
  Filled 2022-10-17 (×7): qty 1

## 2022-10-17 MED ORDER — INSULIN GLARGINE-YFGN 100 UNIT/ML ~~LOC~~ SOLN: 3.0000 [IU] | Freq: Every day | SUBCUTANEOUS | Status: DC

## 2022-10-17 MED ORDER — CEFAZOLIN SODIUM-DEXTROSE 2-4 GM/100ML-% IV SOLN
2.0000 g | Freq: Once | INTRAVENOUS | Status: AC
  Administered 2022-10-17: 2 g via INTRAVENOUS
  Filled 2022-10-17: qty 100

## 2022-10-17 MED ORDER — DEXTROSE 50 % IV SOLN
1.0000 | Freq: Once | INTRAVENOUS | Status: AC
  Administered 2022-10-17: 50 mL via INTRAVENOUS
  Filled 2022-10-17: qty 50

## 2022-10-17 MED ORDER — APIXABAN 2.5 MG PO TABS: 2.5000 mg | ORAL_TABLET | Freq: Two times a day (BID) | ORAL | Status: DC

## 2022-10-17 MED ORDER — ATORVASTATIN CALCIUM 80 MG PO TABS
80.0000 mg | ORAL_TABLET | Freq: Every day | ORAL | Status: DC
  Administered 2022-10-18 – 2022-10-20 (×3): 80 mg via ORAL
  Filled 2022-10-17 (×3): qty 1

## 2022-10-17 MED ORDER — PANTOPRAZOLE SODIUM 40 MG PO TBEC
40.0000 mg | DELAYED_RELEASE_TABLET | Freq: Two times a day (BID) | ORAL | Status: DC
  Administered 2022-10-17 – 2022-10-21 (×8): 40 mg via ORAL
  Filled 2022-10-17 (×8): qty 1

## 2022-10-17 MED ORDER — FERRIC CITRATE 1 GM 210 MG(FE) PO TABS
420.0000 mg | ORAL_TABLET | Freq: Two times a day (BID) | ORAL | Status: DC
  Administered 2022-10-17 – 2022-10-18 (×3): 420 mg via ORAL
  Filled 2022-10-17 (×4): qty 2

## 2022-10-17 MED ORDER — ALBUTEROL SULFATE (2.5 MG/3ML) 0.083% IN NEBU: 2.5000 mg | INHALATION_SOLUTION | Freq: Four times a day (QID) | RESPIRATORY_TRACT | Status: DC | PRN

## 2022-10-17 MED ORDER — SODIUM CHLORIDE 0.9 % IV SOLN
1.0000 g | Freq: Once | INTRAVENOUS | Status: AC
  Administered 2022-10-17: 1 g via INTRAVENOUS
  Filled 2022-10-17: qty 10

## 2022-10-17 MED ORDER — MELATONIN 5 MG PO TABS
5.0000 mg | ORAL_TABLET | Freq: Every day | ORAL | Status: DC
  Administered 2022-10-17 – 2022-10-20 (×4): 5 mg via ORAL
  Filled 2022-10-17 (×4): qty 1

## 2022-10-17 MED ORDER — RENA-VITE PO TABS
1.0000 | ORAL_TABLET | Freq: Every day | ORAL | Status: DC
  Administered 2022-10-17 – 2022-10-20 (×4): 1 via ORAL
  Filled 2022-10-17 (×4): qty 1

## 2022-10-17 MED ORDER — INSULIN ASPART 100 UNIT/ML IJ SOLN
0.0000 [IU] | Freq: Three times a day (TID) | INTRAMUSCULAR | Status: DC
  Administered 2022-10-19: 3 [IU] via SUBCUTANEOUS
  Administered 2022-10-19: 4 [IU] via SUBCUTANEOUS
  Administered 2022-10-20: 1 [IU] via SUBCUTANEOUS
  Administered 2022-10-20: 3 [IU] via SUBCUTANEOUS
  Administered 2022-10-21: 1 [IU] via SUBCUTANEOUS

## 2022-10-17 MED ORDER — CLOPIDOGREL BISULFATE 75 MG PO TABS
75.0000 mg | ORAL_TABLET | Freq: Every day | ORAL | Status: DC
  Administered 2022-10-18 – 2022-10-21 (×4): 75 mg via ORAL
  Filled 2022-10-17 (×4): qty 1

## 2022-10-17 NOTE — Consult Note (Cosign Needed Addendum)
Chief Complaint: Brachial basilic AVG with absent thrill and bruit. Request is for declot possible TDC.   Referring Physician(s): C. Dareen Piano NP  Supervising Physician: Malachy Moan  Patient Status: [facility] - In-pt  History of Present Illness: Robin [name] is a 72 y.o. female inpatient. History of ESRD on HD via a LUE brachial basilic graft placed at OSH approximately 9 years. Admitted for AMS. Daughter at bedside states that the Patient was just discharged from the [place] where she was last had dialysis on 5.22.24. During attempted dialysis today the AVG was found to have no thrill or bruit. Team is requesting a declot possible TDC placement.   Currently without any significant complaints. Patient alert and laying in bed,calm. Endorses rectal pain. Denies any fevers, headache, chest pain, SOB, cough, abdominal pain, nausea, vomiting or bleeding. Return precautions and treatment recommendations and follow-up discussed with the patient 's daughter at bedside who is agreeable with the plan.    Past Medical History:  Diagnosis Date   Acute delirium 11/18/2014   Acute encephalopathy    Acute on chronic respiratory failure with hypoxia (HCC) 09/09/2016   Acute respiratory failure with hypoxia (HCC) 11/29/2015   Anemia in chronic kidney disease 12/09/2014   Anxiety    Arthritis    Benign hypertension    Bipolar affective disorder (HCC) 12/09/2014   CAD (coronary artery disease), native coronary artery with 2 stents  11/17/2014   Charcot foot due to diabetes mellitus (HCC)    Chronic combined systolic and diastolic CHF (congestive heart failure) (HCC) 11/18/2014   Closed left ankle fracture 11/17/2014   Confusion 01/21/2015   Depression    Diabetes mellitus without complication (HCC)    End stage renal disease on dialysis (HCC)    Fracture dislocation of ankle 11/17/2014   GERD (gastroesophageal reflux disease)    Heart murmur    History of blood transfusion    History of bronchitis     History of pneumonia    Hyperlipidemia 03/26/2015   Hypertension associated with diabetes (HCC) 11/18/2014   Hypertensive heart/renal disease with failure (HCC) 12/09/2014   Hypokalemia 11/17/2014   Multiple falls 01/21/2015   Obesity 11/17/2014   Onychomycosis 10/07/2016   Right leg DVT (HCC) 12/05/2015   SIRS (systemic inflammatory response syndrome) (HCC) 08/08/2016   Sleep apnea    Unstable angina (HCC) 12/26/2017   Ventricular tachycardia (HCC)     Past Surgical History:  Procedure Laterality Date   A/V FISTULAGRAM N/A 01/03/2020   Procedure: A/V FISTULAGRAM;  Surgeon: Nada Libman, MD;  [facility]: [facility];  Service: Cardiovascular;  Laterality: N/A;   A/V FISTULAGRAM Left 03/12/2021   Procedure: A/V FISTULAGRAM;  Surgeon: Nada Libman, MD;  Location: [facility];  Service: Cardiovascular;  Laterality: Left;   ABDOMINAL HYSTERECTOMY     ANGIOPLASTY Left 01/26/2020   Procedure: LEFT ARM GRAPH THROMBECTOMY WITH BALLON ANGIOPLASTY;  Surgeon: Maeola Harman, MD;  Location: [facility] OR;  Service: Vascular;  Laterality: Left;   ANKLE CLOSED REDUCTION N/A 11/17/2014   Procedure: CLOSED REDUCTION ANKLE;  Surgeon: Frederico Hamman, MD;  Location: [facility] OR;  Service: Orthopedics;  Laterality: N/A;   ANKLE FUSION Left 03/16/2015   Procedure: Left Tibiocalcaneal Fusion;  Surgeon: Nadara Mustard, MD;  Location: [facility] OR;  Service: Orthopedics;  Laterality: Left;   APPLICATION OF WOUND VAC Right 08/06/2016   Procedure: APPLICATION OF PREVENA WOUND VAC;  Surgeon: Nadara Mustard, MD;  Location: [facility];  Service: Orthopedics;  Laterality: Right;   AV FISTULA PLACEMENT Left may-2016   done at [facility]   CARDIAC CATHETERIZATION     2 stent    CHOLECYSTECTOMY     CORONARY PRESSURE/FFR STUDY N/A 07/31/2022   Procedure: INTRAVASCULAR PRESSURE WIRE/FFR STUDY;  Surgeon: Orbie Pyo, MD;  [facility]: [facility];  Service: Cardiovascular;  Laterality: N/A;   CORONARY STENT PLACEMENT      FISTULOGRAM Left 01/26/2020   Procedure: FISTULOGRAM;  Surgeon: Maeola Harman, MD;  Location[facility] OR;  Service: Vascular;  Laterality: Left;   FOOT ARTHRODESIS Right 08/06/2016   Procedure: Right Foot Fusion Lisfranc Joint;  Surgeon: Nadara Mustard, MD;  Location: [facility] OR;  Service: Orthopedics;  Laterality: Right;   HARDWARE REMOVAL Left 03/16/2015   Procedure: Removal Hardware Left Ankle;  Surgeon: Nadara Mustard, MD;  Location: [facility] OR;  Service: Orthopedics;  Laterality: Left;   IR AV DIALY SHUNT INTRO NEEDLE/INTRACATH INITIAL W/PTA/IMG LEFT  01/05/2018   IR US GUIDE VASC ACCESS LEFT  01/05/2018   LEFT HEART CATH AND CORONARY ANGIOGRAPHY N/A 12/28/2017   Procedure: LEFT HEART CATH AND CORONARY ANGIOGRAPHY;  Surgeon: Kathleene Hazel, MD;  [facility];  Service: Cardiovascular;  Laterality: N/A;   LEFT HEART CATH AND CORONARY ANGIOGRAPHY N/A 07/31/2022   Procedure: LEFT HEART CATH AND CORONARY ANGIOGRAPHY;  Surgeon: Orbie Pyo, MD;  [facility];  Service: Cardiovascular;  Laterality: N/A;   LOWER EXTREMITY ANGIOGRAPHY N/A 03/21/2021   Procedure: LOWER EXTREMITY ANGIOGRAPHY;  Surgeon: Cephus Shelling, MD;  [facility];  Service: Cardiovascular;  Laterality: N/A;   ORIF ANKLE FRACTURE Left 11/20/2014   Procedure: OPEN REDUCTION INTERNAL FIXATION (ORIF) ANKLE FRACTURE;  Surgeon: Sheral Apley, MD;  Location: [facility];  Service: Orthopedics;  Laterality: Left;   PERIPHERAL VASCULAR BALLOON ANGIOPLASTY  01/03/2020   Procedure: PERIPHERAL VASCULAR BALLOON ANGIOPLASTY;  Surgeon: Nada Libman, MD;  [facility];  Service: Cardiovascular;;   PERIPHERAL VASCULAR BALLOON ANGIOPLASTY Left 03/12/2021   Procedure: PERIPHERAL VASCULAR BALLOON ANGIOPLASTY;  Surgeon: Nada Libman, MD;  [facility];  Service: Cardiovascular;  Laterality: Left;   PERIPHERAL VASCULAR BALLOON ANGIOPLASTY  08/22/2021    Procedure: PERIPHERAL VASCULAR BALLOON ANGIOPLASTY;  Surgeon: Cephus Shelling, MD;  [facility];  Service: Cardiovascular;;  Left AVF PTA   TONSILLECTOMY      Allergies: Patient has no known allergies.  Medications: Prior to Admission medications   Medication Sig Start Date End Date Taking? Authorizing Provider  acetaminophen (TYLENOL) 325 MG tablet Take 325 mg by mouth 2 (two) times daily.   Yes [provider]  albuterol (VENTOLIN HFA) 108 (90 Base) MCG/ACT inhaler Inhale 1-2 puffs into the lungs every 6 (six) hours as needed for wheezing or shortness of breath.   Yes [provider]  apixaban (ELIQUIS) 5 MG TABS tablet Take 2.5 mg by mouth 2 (two) times daily with a meal.   Yes [provider]  ARIPiprazole (ABILIFY) 2 MG tablet Take 6 tablets (12 mg total) by mouth at bedtime. 08/05/22 09/04/22  Rana Snare, DO  atorvastatin (LIPITOR) 80 MG tablet Take 1 tablet (80 mg total) by mouth at bedtime. 08/05/22   Rana Snare, DO  CALCIUM PO Take 1 tablet by mouth daily.    [provider]  clopidogrel (PLAVIX) 75 MG tablet Take 1 tablet (75 mg total) by mouth daily. 08/06/22  Rana Snare, DO  diclofenac Sodium (VOLTAREN) 1 % GEL Apply 4 g topically 4 (four) times daily as needed (for back pain). 08/05/22   Rana Snare, DO  ferric citrate (AURYXIA) 1 GM 210 MG(Fe) tablet Take 420 mg by mouth 2 (two) times daily with a meal.    [provider]  gabapentin (NEURONTIN) 100 MG capsule Take 100 mg by mouth 2 (two) times daily.    [provider]  insulin aspart (NOVOLOG) 100 UNIT/ML injection Inject 6 Units into the skin 3 (three) times daily as needed for high blood sugar (CBG >300).    [provider]  insulin detemir (LEVEMIR FLEXTOUCH) 100 UNIT/ML FlexPen Inject 18 Units into the skin daily. 08/05/22   Rana Snare, DO  isosorbide mononitrate (IMDUR) 30 MG 24 hr tablet Take 1 tablet (30 mg total) by mouth  daily with breakfast. 08/06/22   Rana Snare, DO  Melatonin 10 MG TABS Take 10 mg by mouth at bedtime.    [provider]  metoprolol succinate (TOPROL-XL) 25 MG 24 hr tablet Take 1 tablet (25 mg total) by mouth daily. 08/06/22   Rana Snare, DO  multivitamin (RENA-VIT) TABS tablet Take 1 tablet by mouth at bedtime.  12/11/17   [provider]  nitroGLYCERIN (NITROSTAT) 0.4 MG SL tablet Place 0.4 mg under the tongue every 5 (five) minutes as needed for chest pain.    [provider]  pantoprazole (PROTONIX) 40 MG tablet Take 40 mg by mouth 2 (two) times daily before a meal.    [provider]  Tiotropium Bromide Monohydrate (SPIRIVA RESPIMAT) 1.25 MCG/ACT AERS Inhale 2 puffs into the lungs every morning.    [provider]     Family History  Family history unknown: Yes    Social History   Socioeconomic History   Marital status: Divorced    Spouse name: Not on file   Number of children: Not on file   Years of education: Not on file   Highest education level: Not on file  Occupational History   Not on file  Tobacco Use   Smoking status: Every Day    Packs/day: 1    Types: Cigarettes   Smokeless tobacco: Never  Vaping Use   Vaping Use: Never used  Substance and Sexual Activity   Alcohol use: No   Drug use: No   Sexual activity: Never  Other Topics Concern   Not on file  Social History Narrative   Not on file   Social Determinants of Health   Financial Resource Strain: Not on file  Food Insecurity: No Food Insecurity (10/17/2022)   Hunger Vital Sign    Worried About Running Out of Food in the Last Year: Never true    Ran Out of Food in the Last Year: Never true  Transportation Needs: No Transportation Needs (10/17/2022)   PRAPARE - Administrator, Civil Service (Medical): No    Lack of Transportation (Non-Medical): No  Physical Activity: Not on file  Stress: Not on file  Social Connections: Not on file      Review of Systems: A 12 point ROS discussed and pertinent positives are indicated in the HPI above.  All other systems are negative.  Review of Systems  Constitutional:  Negative for fatigue and fever.  HENT:  Negative for congestion.   Respiratory:  Negative for cough and shortness of breath.   Gastrointestinal:  Negative for abdominal pain, diarrhea, nausea and vomiting.  Genitourinary:  Rectal pain.     Vital Signs: BP 123/66 (BP Location: Right Arm)   Pulse 75   Temp 98.5 F (36.9 C)   Resp 18   Ht 5\' 4"  (1.626 m)   Wt 136 lb (61.7 kg)   SpO2 99%   BMI 23.34 kg/m   Advance Care Plan: The advanced care plan/surrogate decision maker was discussed at the time of visit and the patient did not wish to discuss or was not able to name a surrogate decision maker or provide an advance care plan.    Physical Exam Vitals and nursing note reviewed.  Constitutional:      Appearance: She is well-developed.  HENT:     Head: Normocephalic and atraumatic.  Eyes:     Conjunctiva/sclera: Conjunctivae normal.  Cardiovascular:     Comments: AVG LUE -/- Pulmonary:     Effort: Pulmonary effort is normal.  Musculoskeletal:     Cervical back: Normal range of motion.  Skin:    General: Skin is warm and dry.  Neurological:     Mental Status: She is alert.     Imaging: DG Chest Port 1 View  Result Date: 10/17/2022 CLINICAL DATA:  72 year old female recently hospitalized at the [place]. Altered mental status, lethargy. EXAM: PORTABLE CHEST 1 VIEW COMPARISON:  CT Chest, Abdomen, and Pelvis 08/07/2022, and earlier. FINDINGS: Portable AP semi upright view at 0546 hours. Calcified aortic atherosclerosis. Stable mild cardiomegaly. Other mediastinal contours are within normal limits. Visualized tracheal air column is within normal limits. Stable lung volume since March. Chronic left costophrenic angle scarring. Otherwise when allowing for portable technique the lungs are clear. No  pneumothorax. No acute osseous abnormality identified. Negative visible bowel gas. IMPRESSION: No acute cardiopulmonary abnormality. Electronically Signed   By: Odessa Fleming M.D.   On: 10/17/2022 06:01   CT HEAD WO CONTRAST  Result Date: 10/17/2022 CLINICAL DATA:  72 year old female recently hospitalized at the [place]. Altered mental status, lethargy. EXAM: CT HEAD WITHOUT CONTRAST TECHNIQUE: Contiguous axial images were obtained from the base of the skull through the vertex without intravenous contrast. RADIATION DOSE REDUCTION: This exam was performed according to the departmental dose-optimization program which includes automated exposure control, adjustment of the mA and/or kV according to patient size and/or use of iterative reconstruction technique. COMPARISON:  Head CT 07/29/2022.  Brain MRI 07/27/2020. FINDINGS: Brain: Stable cerebral volume. No midline shift, ventriculomegaly, mass effect, evidence of mass lesion, intracranial hemorrhage or evidence of cortically based acute infarction. Stable gray-white differentiation with mild for age periventricular white matter hypodensity. Incidental dural calcification along the falx and tentorium. No cortical encephalomalacia identified. Vascular: Severe Calcified atherosclerosis at the skull base. No suspicious intracranial vascular hyperdensity. Skull: No acute osseous abnormality identified. Sinuses/Orbits: Visualized paranasal sinuses and mastoids are stable and well aerated. Other: No acute orbit or scalp soft tissue finding. Calcified scalp vessel atherosclerosis again noted. IMPRESSION: No acute intracranial abnormality. Stable mild for age white matter disease, advanced calcified atherosclerosis. Electronically Signed   By: Odessa Fleming M.D.   On: 10/17/2022 05:59    Labs:  CBC: Recent Labs    08/02/22 0621 08/05/22 0149 08/07/22 0957 10/17/22 0506 10/17/22 0526  WBC 5.9 5.2 6.3 8.0  --   HGB 12.4 9.6* 10.3* 10.2* 11.2*  HCT 39.4 29.7* 33.0* 31.9* 33.0*   PLT 120* 133* 171 271  --     COAGS: Recent Labs    07/30/22 0915 07/30/22 1845 07/31/22 2039 08/01/22 0302 10/17/22 0506  INR  --   --   --   --  1.4*  APTT 87* 105* 30 35  --     BMP: Recent Labs    08/02/22 0739 08/05/22 0149 08/07/22 0957 10/17/22 0506 10/17/22 0526  NA 135 134* 135 129* 128*  K 3.8 3.7 3.9 4.2 4.1  CL 96* 97* 95* 88*  --   CO2 24 27 28 25   --   GLUCOSE 273* 200* 135* 108*  --   BUN 31* 28* 22 18  --   CALCIUM 9.1 9.2 9.8 9.6  --   CREATININE 4.16* 3.82* 3.74* 5.45*  --   GFRNONAA 11* 12* 12* 8*  --     LIVER FUNCTION TESTS: Recent Labs    04/27/22 1735 07/29/22 0443 07/30/22 0559 08/01/22 0302 10/17/22 0506  BILITOT 1.4* 0.7  --   --  1.6*  AST 34 31  --   --  45*  ALT 18 19  --   --  16  ALKPHOS 97 210*  --   --  141*  PROT 7.5 7.0  --   --  6.6  ALBUMIN 3.6 3.1* 3.0* 3.1* 3.1*     Assessment and Plan:  72 y.o. female inpatient. History of ESRD on HD via a LUE brachial basilic graft placed at OSH approximately 9 years. Admitted for AMS. Daughter at bedside states that the Patient was just discharged from the [place] where she was last had dialysis on 5.22.24. During attempted dialysis today the AVG was found to have no thrill or bruit. Team is requesting a declot possible TDC placement.   No pertinent imaging. IR performed a 7 mm balloon angioplasty on 8.13.19 due to pain. Daughter at bedside states that patient has a stent placed at the [place]. Sodium 128, Cr 5.45. BUN WNL, GFR < 8. Eliquis and Plavix are listed as future medication.  Risks and benefits discussed with the patient including, but not limited to bleeding, infection, vascular injury, pulmonary embolism, need for tunneled HD catheter placement or even death.  All of the patient's questions were answered, patient's daughter is agreeable to proceed.  Consent signed and in chart.   Thank you for this interesting consult.  I greatly enjoyed meeting AMECIA AMAYA and look forward  to participating in their care.  A copy of this report was sent to the requesting provider on this date.  Electronically Signed: Alene Mires, NP 10/17/2022, 4:08 PM   I spent a total of 40 Minutes    in face to face in clinical consultation, greater than 50% of which was counseling/coordinating care for declot possible TDC placement.

## 2022-10-17 NOTE — Consult Note (Signed)
[facility] Renal Consultation Note    Indication for Consultation:  Management of ESRD/hemodialysis; anemia, hypertension/volume and secondary hyperparathyroidism  [id number], [facility]  HPI: Robin [name] is a 72 y.o. female with ESRD on HD MWF at [facility]. Her past medical history is significant for combined CHF, Bipolar disorder, CAD, DM2, HTN, HLD, and DVT who presented to the ED by her family d/t altered mental status from baseline. We were consulted for her dialysis needs. Reviewed previous medical records. She was recently hospitalized at the [name] 10/08/22-10/15/22 d/t psychosis. Changes were made to her psychiatric medications including adding Trazodone. She presented today unresponsive 2nd hypoglycemia and D50 was given. She also showed evidence of an UTI and ABXs were started. Seen and examined patient at bedside. Informed by bedside RN of negative bruit/thrill to her L AVF. I consulted IR today for thrombectomy vs possible TDC placement. Noted of her last HD being on 5/22. Labs include: SrCr 5.45, BUN 18, K+ 4.2, Ca 9.6, Na 128, Ammonia 15, Lactic Acid 1.3, and Hgb 11.2, She doesn't appear overloaded and not in distress. CXR is unremarkable. She is afebrile and blood pressure is soft/stable currently. Plan for HD tomorrow after her vascular access gets taken care of by IR.  Past Medical History:  Diagnosis Date   Acute delirium 11/18/2014   Acute encephalopathy    Acute on chronic respiratory failure with hypoxia (HCC) 09/09/2016   Acute respiratory failure with hypoxia (HCC) 11/29/2015   Anemia in chronic kidney disease 12/09/2014   Anxiety    Arthritis    Benign hypertension    Bipolar affective disorder (HCC) 12/09/2014   CAD (coronary artery disease), native coronary artery with 2 stents  11/17/2014   Charcot foot due to diabetes mellitus (HCC)    Chronic combined systolic and diastolic CHF (congestive heart failure) (HCC) 11/18/2014    Closed left ankle fracture 11/17/2014   Confusion 01/21/2015   Depression    Diabetes mellitus without complication (HCC)    End stage renal disease on dialysis (HCC)    Fracture dislocation of ankle 11/17/2014   GERD (gastroesophageal reflux disease)    Heart murmur    History of blood transfusion    History of bronchitis    History of pneumonia    Hyperlipidemia 03/26/2015   Hypertension associated with diabetes (HCC) 11/18/2014   Hypertensive heart/renal disease with failure (HCC) 12/09/2014   Hypokalemia 11/17/2014   Multiple falls 01/21/2015   Obesity 11/17/2014   Onychomycosis 10/07/2016   Right leg DVT (HCC) 12/05/2015   SIRS (systemic inflammatory response syndrome) (HCC) 08/08/2016   Sleep apnea    Unstable angina (HCC) 12/26/2017   Ventricular tachycardia (HCC)    Past Surgical History:  Procedure Laterality Date   A/V FISTULAGRAM N/A 01/03/2020   Procedure: A/V FISTULAGRAM;  Surgeon: Nada Libman, MD;  [facility]: [facility];  Service: Cardiovascular;  Laterality: N/A;   A/V FISTULAGRAM Left 03/12/2021   Procedure: A/V FISTULAGRAM;  Surgeon: Nada Libman, MD;  Location: [facility];  Service: Cardiovascular;  Laterality: Left;   ABDOMINAL HYSTERECTOMY     ANGIOPLASTY Left 01/26/2020   Procedure: LEFT ARM GRAPH THROMBECTOMY WITH BALLON ANGIOPLASTY;  Surgeon: Maeola Harman, MD;  Location: [name] OR;  Service: Vascular;  Laterality: Left;   ANKLE CLOSED REDUCTION N/A 11/17/2014   Procedure: CLOSED REDUCTION ANKLE;  Surgeon: Frederico Hamman, MD;  Location: [name] OR;  Service: Orthopedics;  Laterality: N/A;   ANKLE FUSION  Left 03/16/2015   Procedure: Left Tibiocalcaneal Fusion;  Surgeon: Nadara Mustard, MD;  Location: [facility] OR;  Service: Orthopedics;  Laterality: Left;   APPLICATION OF WOUND VAC Right 08/06/2016   Procedure: APPLICATION OF PREVENA WOUND VAC;  Surgeon: Nadara Mustard, MD;  Location: [facility] OR;  Service: Orthopedics;  Laterality: Right;   AV FISTULA  PLACEMENT Left may-2016   done at [name]   CARDIAC CATHETERIZATION     2 stent    CHOLECYSTECTOMY     CORONARY PRESSURE/FFR STUDY N/A 07/31/2022   Procedure: INTRAVASCULAR PRESSURE WIRE/FFR STUDY;  Surgeon: Orbie Pyo, MD;  [facility]: [facility];  Service: Cardiovascular;  Laterality: N/A;   CORONARY STENT PLACEMENT     FISTULOGRAM Left 01/26/2020   Procedure: FISTULOGRAM;  Surgeon: Maeola Harman, MD;  Location: [facility] OR;  Service: Vascular;  Laterality: Left;   FOOT ARTHRODESIS Right 08/06/2016   Procedure: Right Foot Fusion Lisfranc Joint;  Surgeon: Nadara Mustard, MD;  Location: [facility] OR;  Service: Orthopedics;  Laterality: Right;   HARDWARE REMOVAL Left 03/16/2015   Procedure: Removal Hardware Left Ankle;  Surgeon: Nadara Mustard, MD;  Location: [facility] OR;  Service: Orthopedics;  Laterality: Left;   IR AV DIALY SHUNT INTRO NEEDLE/INTRACATH INITIAL W/PTA/IMG LEFT  01/05/2018   IR US GUIDE VASC ACCESS LEFT  01/05/2018   LEFT HEART CATH AND CORONARY ANGIOGRAPHY N/A 12/28/2017   Procedure: LEFT HEART CATH AND CORONARY ANGIOGRAPHY;  Surgeon: Kathleene Hazel, MD;  [facility];  Service: Cardiovascular;  Laterality: N/A;   LEFT HEART CATH AND CORONARY ANGIOGRAPHY N/A 07/31/2022   Procedure: LEFT HEART CATH AND CORONARY ANGIOGRAPHY;  Surgeon: Orbie Pyo, MD;  [facility];  Service: Cardiovascular;  Laterality: N/A;   LOWER EXTREMITY ANGIOGRAPHY N/A 03/21/2021   Procedure: LOWER EXTREMITY ANGIOGRAPHY;  Surgeon: Cephus Shelling, MD;  [facility];  Service: Cardiovascular;  Laterality: N/A;   ORIF ANKLE FRACTURE Left 11/20/2014   Procedure: OPEN REDUCTION INTERNAL FIXATION (ORIF) ANKLE FRACTURE;  Surgeon: Sheral Apley, MD;  Location: [facility] OR;  Service: Orthopedics;  Laterality: Left;   PERIPHERAL VASCULAR BALLOON ANGIOPLASTY  01/03/2020   Procedure: PERIPHERAL VASCULAR BALLOON ANGIOPLASTY;  Surgeon: Nada Libman, MD;   [facility];  Service: Cardiovascular;;   PERIPHERAL VASCULAR BALLOON ANGIOPLASTY Left 03/12/2021   Procedure: PERIPHERAL VASCULAR BALLOON ANGIOPLASTY;  Surgeon: Nada Libman, MD;  [facility];  Service: Cardiovascular;  Laterality: Left;   PERIPHERAL VASCULAR BALLOON ANGIOPLASTY  08/22/2021   Procedure: PERIPHERAL VASCULAR BALLOON ANGIOPLASTY;  Surgeon: Cephus Shelling, MD;  [facility];  Service: Cardiovascular;;  Left AVF PTA   TONSILLECTOMY     Family History  Family history unknown: Yes   Social History:  reports that she has been smoking cigarettes. She has been smoking an average of 1 pack per day. She has never used smokeless tobacco. She reports that she does not drink alcohol and does not use drugs. No Known Allergies Prior to Admission medications   Medication Sig Start Date End Date Taking? Authorizing Provider  acetaminophen (TYLENOL) 325 MG tablet Take 325 mg by mouth 2 (two) times daily.   Yes [provider]  albuterol (VENTOLIN HFA) 108 (90 Base) MCG/ACT inhaler Inhale 1-2 puffs into the lungs every 6 (six) hours as needed for wheezing or shortness of breath.   Yes [provider]  apixaban Everlene Balls) 5  MG TABS tablet Take 2.5 mg by mouth 2 (two) times daily with a meal.   Yes [provider]  ARIPiprazole (ABILIFY) 2 MG tablet Take 6 tablets (12 mg total) by mouth at bedtime. Patient taking differently: Take 2 mg by mouth at bedtime. 08/05/22 10/17/22 Yes Rana Snare, DO  atorvastatin (LIPITOR) 80 MG tablet Take 1 tablet (80 mg total) by mouth at bedtime. 08/05/22  Yes Rana Snare, DO  CALCIUM PO Take 1 tablet by mouth daily.   Yes [provider]  clopidogrel (PLAVIX) 75 MG tablet Take 1 tablet (75 mg total) by mouth daily. 08/06/22  Yes Rana Snare, DO  diclofenac Sodium (VOLTAREN) 1 % GEL Apply 4 g topically 4 (four) times daily as needed (for back pain). 08/05/22  Yes Rana Snare, DO  ferric citrate (AURYXIA) 1 GM 210 MG(Fe) tablet Take 420 mg by mouth 2 (two) times daily with a meal.   Yes [provider]  insulin aspart (NOVOLOG) 100 UNIT/ML injection Inject 6 Units into the skin 3 (three) times daily as needed for high blood sugar (CBG >300).   Yes [provider]  insulin detemir (LEVEMIR FLEXTOUCH) 100 UNIT/ML FlexPen Inject 18 Units into the skin daily. 08/05/22  Yes Rana Snare, DO  isosorbide mononitrate (IMDUR) 30 MG 24 hr tablet Take 1 tablet (30 mg total) by mouth daily with breakfast. 08/06/22  Yes Rana Snare, DO  Melatonin 10 MG TABS Take 10 mg by mouth at bedtime.   Yes [provider]  metoprolol succinate (TOPROL-XL) 25 MG 24 hr tablet Take 1 tablet (25 mg total) by mouth daily. 08/06/22  Yes Rana Snare, DO  multivitamin (RENA-VIT) TABS tablet Take 1 tablet by mouth at bedtime.  12/11/17  Yes [provider]  pantoprazole (PROTONIX) 40 MG tablet Take 40 mg by mouth daily.   Yes [provider]  Tiotropium Bromide Monohydrate (SPIRIVA RESPIMAT) 1.25 MCG/ACT AERS Inhale 2 puffs into the lungs every morning.   Yes [provider]  nitroGLYCERIN (NITROSTAT) 0.4 MG SL tablet Place 0.4 mg under the tongue every 5 (five) minutes as needed for chest pain.    [provider]   Current Facility-Administered Medications  Medication Dose Route Frequency Provider Last Rate Last Admin   albuterol (PROVENTIL) (2.5 MG/3ML) 0.083% nebulizer solution 2.5 mg  2.5 mg Inhalation Q6H PRN Alfredo Martinez, MD       Melene Muller ON 10/18/2022] apixaban (ELIQUIS) tablet 2.5 mg  2.5 mg Oral BID WC Alfredo Martinez, MD       Melene Muller ON 10/18/2022] atorvastatin (LIPITOR) tablet 80 mg  80 mg Oral QHS Maxwell, Allee, MD       ceFAZolin (ANCEF) IVPB 2g/100 mL premix  2 g Intravenous Once Alene Mires, NP       [START ON 10/18/2022] clopidogrel (PLAVIX) tablet 75 mg  75 mg Oral Daily Maxwell, Allee, MD       ferric  citrate (AURYXIA) tablet 420 mg  420 mg Oral BID WC Maxwell, Allee, MD       insulin aspart (novoLOG) injection 0-6 Units  0-6 Units Subcutaneous TID WC Maxwell, Allee, MD       Melene Muller ON 10/18/2022] insulin glargine-yfgn (SEMGLEE) injection 3 Units  3 Units Subcutaneous QHS Maxwell, Allee, MD       multivitamin (RENA-VIT) tablet 1 tablet  1 tablet Oral QHS Maxwell, Allee, MD       pantoprazole (PROTONIX) EC tablet 40 mg  40 mg Oral BID AC Maxwell,  Allee, MD       umeclidinium bromide (INCRUSE ELLIPTA) 62.5 MCG/ACT 1 puff  1 puff Inhalation q morning Alfredo Martinez, MD       Labs: Basic Metabolic Panel: Recent Labs  Lab 10/17/22 0506 10/17/22 0526  NA 129* 128*  K 4.2 4.1  CL 88*  --   CO2 25  --   GLUCOSE 108*  --   BUN 18  --   CREATININE 5.45*  --   CALCIUM 9.6  --    Liver Function Tests: Recent Labs  Lab 10/17/22 0506  AST 45*  ALT 16  ALKPHOS 141*  BILITOT 1.6*  PROT 6.6  ALBUMIN 3.1*   No results for input(s): "LIPASE", "AMYLASE" in the last 168 hours. Recent Labs  Lab 10/17/22 0811  AMMONIA 15   CBC: Recent Labs  Lab 10/17/22 0506 10/17/22 0526  WBC 8.0  --   NEUTROABS 5.5  --   HGB 10.2* 11.2*  HCT 31.9* 33.0*  MCV 102.2*  --   PLT 271  --    Cardiac Enzymes: No results for input(s): "CKTOTAL", "CKMB", "CKMBINDEX", "TROPONINI" in the last 168 hours. CBG: Recent Labs  Lab 10/17/22 0521 10/17/22 1314 10/17/22 1527  GLUCAP 92 89 103*   Iron Studies: No results for input(s): "IRON", "TIBC", "TRANSFERRIN", "FERRITIN" in the last 72 hours. Studies/Results: DG Chest Port 1 View  Result Date: 10/17/2022 CLINICAL DATA:  72 year old female recently hospitalized at the [place]. Altered mental status, lethargy. EXAM: PORTABLE CHEST 1 VIEW COMPARISON:  CT Chest, Abdomen, and Pelvis 08/07/2022, and earlier. FINDINGS: Portable AP semi upright view at 0546 hours. Calcified aortic atherosclerosis. Stable mild cardiomegaly. Other mediastinal contours are within  normal limits. Visualized tracheal air column is within normal limits. Stable lung volume since March. Chronic left costophrenic angle scarring. Otherwise when allowing for portable technique the lungs are clear. No pneumothorax. No acute osseous abnormality identified. Negative visible bowel gas. IMPRESSION: No acute cardiopulmonary abnormality. Electronically Signed   By: Odessa Fleming M.D.   On: 10/17/2022 06:01   CT HEAD WO CONTRAST  Result Date: 10/17/2022 CLINICAL DATA:  72 year old female recently hospitalized at the [place]. Altered mental status, lethargy. EXAM: CT HEAD WITHOUT CONTRAST TECHNIQUE: Contiguous axial images were obtained from the base of the skull through the vertex without intravenous contrast. RADIATION DOSE REDUCTION: This exam was performed according to the departmental dose-optimization program which includes automated exposure control, adjustment of the mA and/or kV according to patient size and/or use of iterative reconstruction technique. COMPARISON:  Head CT 07/29/2022.  Brain MRI 07/27/2020. FINDINGS: Brain: Stable cerebral volume. No midline shift, ventriculomegaly, mass effect, evidence of mass lesion, intracranial hemorrhage or evidence of cortically based acute infarction. Stable gray-white differentiation with mild for age periventricular white matter hypodensity. Incidental dural calcification along the falx and tentorium. No cortical encephalomalacia identified. Vascular: Severe Calcified atherosclerosis at the skull base. No suspicious intracranial vascular hyperdensity. Skull: No acute osseous abnormality identified. Sinuses/Orbits: Visualized paranasal sinuses and mastoids are stable and well aerated. Other: No acute orbit or scalp soft tissue finding. Calcified scalp vessel atherosclerosis again noted. IMPRESSION: No acute intracranial abnormality. Stable mild for age white matter disease, advanced calcified atherosclerosis. Electronically Signed   By: Odessa Fleming M.D.   On:  10/17/2022 05:59    ROS: All others negative except those listed in HPI.  Physical Exam: Vitals:   10/17/22 1300 10/17/22 1400 10/17/22 1515 10/17/22 1525  BP: 136/70 114/77 123/66   Pulse:  Resp: 17 12 14 18   Temp:    98.5 F (36.9 C)  TempSrc:      SpO2:      Weight:      Height:         General: Awake, alert to herself only, on RA, NAD Head: Sclera not icteric  Lungs: Clear anteriorly and laterally. No wheeze, rales or rhonchi. Breathing is unlabored. Heart: RRR. No murmur, rubs or gallops.  Abdomen: soft and non-tender Lower extremities: No LE edema Neuro: Appears altered, alert to herself only Dialysis Access: L AVF negative bruit/thrill  Dialysis Orders:  [facility] 3hrs74min, BFR 400, DFR Auto 1.5,  EDW 59kg, 2K/ 2Ca Heparin 5000 units with HD Mircera 100 mcg q2wks - last 5/13 Hectorol IV qHD - Last 5/22  Assessment/Plan: Altered Mental Status - Per primary. Likely d/t Trazodone. Appears dose was lowered. Appreciate Psych recommendations. Hyponatremia - Na now 128. Euvolemic on exam. May be 2nd dehydration vs Trazodone (dose lowered). Need to be mindful on IVFs to prevent volume status from worsening ESRD -  on HD usually MWF. Unfortunately, L AVF (-) for B/T. Consulted IR today for thrombectomy vs possible TDC placement. Plan for HD tomorrow after vascular access is taken care of. Hypertension/volume  - Blood pressure soft/stable, euvolemic on exam, CXR unremarkable.  Anemia of CKD - Hgb 11.2. No indication for Fe or ESA at this time.  Secondary Hyperparathyroidism -  Ca ok. Checking phos in AM.  Nutrition - NPO. Can be on renal diet once mental status returns to baseline DM - Per primary  Salome Holmes, NP [facility] 10/17/2022, 4:20 PM

## 2022-10-17 NOTE — Assessment & Plan Note (Addendum)
HD MWF. TDC, dialysis yesterday. -Appreciate nephrology recs -F[facility]

## 2022-10-17 NOTE — ED Provider Notes (Signed)
Robin [name] Provider Note   CSN: [id number] Arrival date & time: 10/17/22  0440     History {Add pertinent medical, surgical, social history, OB history to HPI:1} Chief Complaint  Patient presents with   Altered Mental Status    Robin [name] is a 72 y.o. female.  71 year old female that is brought in by her son and daughter for altered mental status.  Patient was recently admitted to [facility] for psychiatric episode.  She was there for 8 or 9 days.  They increased her Abilify and started on trazodone.  She has been altered for the last couple days intermittently.  The daughter was thinking that she would probably get better by today and they have been holding her trazodone.  However this morning she was still minimally responsive so brought here for further evaluation.  Her blood sugars have been running in 100s and she does oftentimes get altered when her blood sugars are in the 100 that she is normally in the 3-4 100s.  No recent infections.  No falls.  She does have history of A-fib and is on apixaban for that.  She has had multiple medication reactions in the past similar to this.  Patient has no complaints.   Altered Mental Status      Home Medications Prior to Admission medications   Medication Sig Start Date End Date Taking? Authorizing Provider  acetaminophen (TYLENOL) 325 MG tablet Take 975 mg by mouth 3 (three) times a week.    [provider]  albuterol (VENTOLIN HFA) 108 (90 Base) MCG/ACT inhaler Inhale 1-2 puffs into the lungs every 6 (six) hours as needed for wheezing or shortness of breath.    [provider]  apixaban (ELIQUIS) 5 MG TABS tablet Take 2.5 mg by mouth 2 (two) times daily with a meal.    [provider]  ARIPiprazole (ABILIFY) 2 MG tablet Take 6 tablets (12 mg total) by mouth at bedtime. 08/05/22 09/04/22  Rana Snare, DO  atorvastatin (LIPITOR) 80 MG tablet Take 1 tablet (80 mg  total) by mouth at bedtime. 08/05/22   Rana Snare, DO  CALCIUM PO Take 1 tablet by mouth daily.    [provider]  clopidogrel (PLAVIX) 75 MG tablet Take 1 tablet (75 mg total) by mouth daily. 08/06/22   Rana Snare, DO  diclofenac Sodium (VOLTAREN) 1 % GEL Apply 4 g topically 4 (four) times daily as needed (for back pain). 08/05/22   Rana Snare, DO  ferric citrate (AURYXIA) 1 GM 210 MG(Fe) tablet Take 420 mg by mouth 2 (two) times daily with a meal.    [provider]  gabapentin (NEURONTIN) 100 MG capsule Take 100 mg by mouth 2 (two) times daily.    [provider]  insulin aspart (NOVOLOG) 100 UNIT/ML injection Inject 6 Units into the skin 3 (three) times daily as needed for high blood sugar (CBG >300).    [provider]  insulin detemir (LEVEMIR FLEXTOUCH) 100 UNIT/ML FlexPen Inject 18 Units into the skin daily. 08/05/22   Rana Snare, DO  isosorbide mononitrate (IMDUR) 30 MG 24 hr tablet Take 1 tablet (30 mg total) by mouth daily with breakfast. 08/06/22   Rana Snare, DO  Melatonin 10 MG TABS Take 10 mg by mouth at bedtime.    [provider]  metoprolol succinate (TOPROL-XL) 25 MG 24 hr tablet Take 1 tablet (25 mg total) by mouth daily. 08/06/22   Rana Snare, DO  multivitamin (RENA-VIT) TABS tablet Take 1 tablet by mouth at bedtime.  12/11/17   [provider]  nitroGLYCERIN (NITROSTAT) 0.4 MG SL tablet Place 0.4 mg under the tongue every 5 (five) minutes as needed for chest pain.    [provider]  pantoprazole (PROTONIX) 40 MG tablet Take 40 mg by mouth 2 (two) times daily before a meal.    [provider]  Tiotropium Bromide Monohydrate (SPIRIVA RESPIMAT) 1.25 MCG/ACT AERS Inhale 2 puffs into the lungs every morning.    [provider]      Allergies    Patient has no known allergies.    Review of Systems   Review of Systems  Physical Exam Updated Vital Signs Temp 97.6 F (36.4 C)  (Oral)   Ht 5\' 4"  (1.626 m)   Wt 61.7 kg   BMI 23.34 kg/m  Physical Exam Vitals and nursing note reviewed.  Constitutional:      Appearance: She is well-developed.  HENT:     Head: Normocephalic and atraumatic.     Mouth/Throat:     Mouth: Mucous membranes are dry.  Eyes:     General: Scleral icterus (mild) present.  Cardiovascular:     Rate and Rhythm: Normal rate and regular rhythm.  Pulmonary:     Effort: No respiratory distress.     Breath sounds: No stridor.  Abdominal:     General: There is no distension.  Musculoskeletal:     Cervical back: Normal range of motion.  Neurological:     Mental Status: She is alert. Mental status is at baseline.     Comments: Patient is slow to answer and slow to respond to commands but is able to squeeze my fingers, lift her arms, close her eyes, open her eyes, smile, stick out her tongue, move her feet, lift her legs symmetrically.  She has sensation to light touch in bilateral lower and bilateral upper extremities.     ED Results / Procedures / Treatments   Labs (all labs ordered are listed, but only abnormal results are displayed) Labs Reviewed  COMPREHENSIVE METABOLIC PANEL - Abnormal; Notable for the following components:      Result Value   Sodium 129 (*)    Chloride 88 (*)    Glucose, Bld 108 (*)    Creatinine, Ser 5.45 (*)    Albumin 3.1 (*)    AST 45 (*)    Alkaline Phosphatase 141 (*)    Total Bilirubin 1.6 (*)    GFR, Estimated 8 (*)    Anion gap 16 (*)    All other components within normal limits  CBC WITH DIFFERENTIAL/PLATELET - Abnormal; Notable for the following components:   RBC 3.12 (*)    Hemoglobin 10.2 (*)    HCT 31.9 (*)    MCV 102.2 (*)    RDW 17.2 (*)    All other components within normal limits  PROTIME-INR - Abnormal; Notable for the following components:   Prothrombin Time 17.1 (*)    INR 1.4 (*)    All other components within normal limits  I-STAT VENOUS BLOOD GAS, ED - Abnormal; Notable for the  following components:   pH, Ven 7.449 (*)    Bicarbonate 32.1 (*)    TCO2 33 (*)    Acid-Base Excess 7.0 (*)    Sodium 128 (*)    Calcium, Ion 1.11 (*)    HCT 33.0 (*)    Hemoglobin 11.2 (*)    All other components within normal  limits  CULTURE, BLOOD (ROUTINE X 2)  CULTURE, BLOOD (ROUTINE X 2)  RAPID URINE DRUG SCREEN, HOSP PERFORMED  URINALYSIS, W/ REFLEX TO CULTURE (INFECTION SUSPECTED)  AMMONIA  LACTIC ACID, PLASMA  LACTIC ACID, PLASMA  TSH  T4, FREE  CBG MONITORING, ED    EKG EKG Interpretation  Date/Time:  Friday Oct 17 2022 04:54:28 EDT Ventricular Rate:  91 PR Interval:  217 QRS Duration: 103 QT Interval:  421 QTC Calculation: 518 R Axis:   -45 Text Interpretation: Sinus rhythm Left anterior fascicular block Abnormal R-wave progression, late transition LVH with secondary repolarization abnormality Prolonged QT interval Confirmed by Marily Memos [phone]) on 10/17/2022 5:25:40 AM  Radiology DG Chest Port 1 View  Result Date: 10/17/2022 CLINICAL DATA:  72 year old female recently hospitalized at the [place]. Altered mental status, lethargy. EXAM: PORTABLE CHEST 1 VIEW COMPARISON:  CT Chest, Abdomen, and Pelvis 08/07/2022, and earlier. FINDINGS: Portable AP semi upright view at 0546 hours. Calcified aortic atherosclerosis. Stable mild cardiomegaly. Other mediastinal contours are within normal limits. Visualized tracheal air column is within normal limits. Stable lung volume since March. Chronic left costophrenic angle scarring. Otherwise when allowing for portable technique the lungs are clear. No pneumothorax. No acute osseous abnormality identified. Negative visible bowel gas. IMPRESSION: No acute cardiopulmonary abnormality. Electronically Signed   By: Odessa Fleming M.D.   On: 10/17/2022 06:01   CT HEAD WO CONTRAST  Result Date: 10/17/2022 CLINICAL DATA:  72 year old female recently hospitalized at the [place]. Altered mental status, lethargy. EXAM: CT HEAD WITHOUT CONTRAST TECHNIQUE:  Contiguous axial images were obtained from the base of the skull through the vertex without intravenous contrast. RADIATION DOSE REDUCTION: This exam was performed according to the departmental dose-optimization program which includes automated exposure control, adjustment of the mA and/or kV according to patient size and/or use of iterative reconstruction technique. COMPARISON:  Head CT 07/29/2022.  Brain MRI 07/27/2020. FINDINGS: Brain: Stable cerebral volume. No midline shift, ventriculomegaly, mass effect, evidence of mass lesion, intracranial hemorrhage or evidence of cortically based acute infarction. Stable gray-white differentiation with mild for age periventricular white matter hypodensity. Incidental dural calcification along the falx and tentorium. No cortical encephalomalacia identified. Vascular: Severe Calcified atherosclerosis at the skull base. No suspicious intracranial vascular hyperdensity. Skull: No acute osseous abnormality identified. Sinuses/Orbits: Visualized paranasal sinuses and mastoids are stable and well aerated. Other: No acute orbit or scalp soft tissue finding. Calcified scalp vessel atherosclerosis again noted. IMPRESSION: No acute intracranial abnormality. Stable mild for age white matter disease, advanced calcified atherosclerosis. Electronically Signed   By: Odessa Fleming M.D.   On: 10/17/2022 05:59    Procedures Procedures  {Document cardiac monitor, telemetry assessment procedure when appropriate:1}  Medications Ordered in ED Medications  lactated ringers bolus 500 mL (500 mLs Intravenous New Bag/Given 10/17/22 0621)  dextrose 50 % solution 50 mL (50 mLs Intravenous Given 10/17/22 9811)    ED Course/ Medical Decision Making/ A&P   {   Click here for ABCD2, HEART and other calculatorsREFRESH Note before signing :1}                          Medical Decision Making Amount and/or Complexity of Data Reviewed Labs: ordered. Radiology: ordered.  Risk Prescription drug  management.   Patient initially hypotensive.  This improved with a small amount of fluids.  I noted her glucose was 92 and family states that she is significantly altered when her blood sugars were that low.  Amp of D50 was given and the patient reportedly had significant improvement while getting it and is now answering questions a little bit better.  Some of her labs have resulted showing that she has a mildly low sodium but this should not be to the point that it is causing her to be altered.  Could be the blood sugar or the low blood pressure that was causing her to act abnormally.  Will reevaluate after lab results to ensure she is close to baseline but I suspect that it was a blood sugar/blood pressure that caused it.  No evidence of infection.  {Document critical care time when appropriate:1} {Document review of labs and clinical decision tools ie heart score, Chads2Vasc2 etc:1}  {Document your independent review of radiology images, and any outside records:1} {Document your discussion with family members, caretakers, and with consultants:1} {Document social determinants of health affecting pt's care:1} {Document your decision making why or why not admission, treatments were needed:1} Final Clinical Impression(s) / ED Diagnoses Final diagnoses:  None    Rx / DC Orders ED Discharge Orders     None

## 2022-10-17 NOTE — ED Provider Notes (Signed)
Patient care was taken over from Dr. Clayborne Dana.  Patient presented with some altered mental status.  She recently had a change in her psychiatric medications including adding trazodone.  However this morning she was essentially unresponsive.  Her glucose was around 90 and she was given D50.  Her family reported that she typically has symptoms of her sugar gets below 100.  She did have improvement of her mental status after getting the D50.  She is apparently back to her normal mental status.  However she was hypothermic on arrival with a rectal temp of 95.8.  She has been having soft blood pressures in the 90s.  She has evidence of a UTI.  There was some squamous cells although this was a cath specimen.  We attempted a recollection and she had reported thick urine with not enough to collect another sample.  She was started on Rocephin.  Her urine was sent for culture.  Her lactate is normal.  Given her hypothermia in association with the borderline soft blood pressures and evidence of infection, will plan admission.  Spoke with the medicine resident who admit the patient for further treatment.   Rolan Bucco, MD 10/17/22 [phone]

## 2022-10-17 NOTE — ED Triage Notes (Addendum)
Pt brought from home, released from [facility] hospital on Wed. Pt's daughter states since placed on trazodone 10mg  has been more altered than normal, lethargic, nonverbal and not able to complete ADL's, which is not her baseline. Pt normally does everything on her own.

## 2022-10-17 NOTE — Assessment & Plan Note (Addendum)
Resolved, VSS

## 2022-10-17 NOTE — Progress Notes (Signed)
   10/17/22 1508  Fistula / Graft Left Upper arm  No Placement Date or Time found.   Orientation: Left  Access Location: Upper arm  Site Condition Red/inflamed;Other (Comment) (bruising)  Fistula / Graft Assessment Absent     Nephrology informed. Courtney DNP at bedside.

## 2022-10-17 NOTE — ED Notes (Signed)
Admitting MD at bedside.

## 2022-10-17 NOTE — H&P (Addendum)
Hospital Admission History and Physical Service Pager: [phone]  [name] Medical record number: [id number] Date of Birth: September 06, 1950 Age: 72 y.o. Gender: female  Primary Care Provider: [facility] Consultants: Psychiatry, Nephrology Code Status: FULL  Preferred Emergency Contact:  Contact Information     Name Relation Home Work [place] Daughter [phone]     Troop,Chris Son   [phone]       Chief Complaint: Altered mental status  Assessment and Plan: Robin [name] is a 72 y.o. female presenting with altered mental status. Differential for this patient's presentation of this includes metabolic encephalopathy, medication adverse effect, and blood sugar fluctuations.    * AMS (altered mental status) On admission, initially patient had altered mental status.  She was recently discharged from the [facility] from 5/15-5/22 for psychosis.  She was discharged on 15 mg Abilify and 25 mg trazodone.  EKG on admission has prolonged QTc of 518.  Psychiatric medications may be contributing to prolonged QTc.  UDS negative, ammonia negative, lactic acid negative, glucose is 98, TSH WNL.  No leukocytosis and she is afebrile.  Patient has also not miss her HD sessions.  Per family, patient tends to become more altered with drastic changes in blood sugar.  Patient is now at baseline mentation which is specifically A&O x 3. to name, place, and date however not knowing the situation.  Will observe overnight and discussed with nephrology if they would like to do HD while she is in the hospital and monitor for clinical improvement. - Admit to FMTS, attending Dr. Lum Babe - Consult psychiatry, appreciate recommendations - Holding Abilify and trazodone pending psychiatry recommendations along with prolonged Qtc - BC pending - Vitamin B1 and vitamin B12 pending - Ethanol, salicylate level, acetaminophen level pending - RPR, HIV pending - Delirium  precautions - AM CBC, RFP  Hyponatremia Na 129 on admission.  Most likely due to ESRD or low volume.  Will continue to monitor. - AM RFP  Hypotension Most likely in the setting of ESRD.  Patient initially was normotensive when she is brought in the hospital and now pressures are in the 90s/80s.  Patient also had hypothermia of 95.8 F but now has resolved to 97.8 F. - Continue to monitor  ESRD (end stage renal disease) (HCC) HD MWF.  GFR on admission is 8.  Will discuss with nephrology if HD is indicated during hospital stay - Nephrology consulted, appreciate recommendations - Continue home ferric citrate - Avoid nephrotoxic medications - AM RFP  Diabetes mellitus type 2, insulin dependent (HCC) A1c in 07/2022 was 7.7.  Takes sliding scale short acting insulin and reported to be discharged from last hospital admission on 10/15/2022 with 5 units of long-acting insulin.  Drastic changes of blood sugar in the past have contributed to altered mentation. -Start Semglee 3 units -SSI  -CBG monitoring   Chronic medical conditions History of CAD and NSTEMI-continue Eliquis, Plavix; holding isosorbide mononitrate, metoprolol, nitroglycerin Heart failure, A-fib, history of PE and DVT GERD-continue Protonix Hyperlipidemia-continue Lipitor Asthma-continue albuterol and tiotropium bromide  FEN/GI: Renal VTE Prophylaxis: heparin  Disposition: tele-medical  History of Present Illness:  Robin [name] is a 72 y.o. female presenting with AMS, recent psychiatric admission in the [place]. [facility], daughter thought was related to her psych medication change. She had improvement of mental status after ED arrival. Daughter had stopped trazodone, thought to be the cause of her  confusion.   Patient notes that she can't stand her daughter, reports that she lives by herself with her grandson. She notes that she could cook and clean if she wanted to but that her daughter  notes she would "burn the house down."  Spoke with patient's daughter, Arceli Chila.  She states that patient was recently at [facility] from 5/15 - 5/22 for psychosis.  At baseline, patient is able to do her ADLs but receives home care.  She reports the patient has not missed any hemodialysis sessions. For mentation, patient typically does not recall some recent memory but is able to state her name, date  Level 5 caveat   In the ED, amp of D50 was given.  Labs show low sodium.  Glucose showed 92.  UA was questionable for UTI so 1 dose of ceftriaxone was given.  Review Of Systems: Per HPI with the following additions: Denies fever, pain, urinary issues, SOB  Pertinent Past Medical History: Anxiety Bipolar disorder CAD Diabetes melitis ESRD GERD Hyperlipidemia Hypertension Obesity Right leg DVT Sleep apnea   Pertinent Past Surgical History:  ABDOMINAL HYSTERECTOMY       ANGIOPLASTY Left 01/26/2020   ANKLE CLOSED REDUCTION N/A 11/17/2014    Procedure: CLOSED REDUCTION ANKLE;  Surgeon: Frederico Hamman, MD;  Location: [name] OR;  Service: Orthopedics;  Laterality: N/A;   ANKLE FUSION Left 03/16/2015    Procedure: Left Tibiocalcaneal Fusion;  Surgeon: Nadara Mustard, MD;  Location: [facility] OR;  Service: Orthopedics;  Laterality: Left;   AV FISTULA PLACEMENT Left may-2016   CHOLECYSTECTOMY       PERIPHERAL VASCULAR BALLOON ANGIOPLASTY   08/22/2021   TONSILLECTOMY     Remainder reviewed in history tab.  Pertinent Social History: Tobacco use: Former years ago Alcohol use: No Other Substance use: No Lives with Grandson   Pertinent Family History: Father - MI at 35 y/o, CVA, Diabetes, HTN Mother - MI at 67y/o  Remainder reviewed in history tab.   Important Outpatient Medications: Albuterol as needed Eliquis 2.5 mg twice daily Lipitor 80 mg Calcium Plavix 75 mg Ferric citrate 420 twice daily Sliding scale insulin Semglee 5 units Isosorbide mononitrate 30 mg Metoprolol 25  mg Nitroglycerin 0.4 mg as needed Protonix 40 mg daily Spiriva Respimat 1.25 mcg/ACT Remainder reviewed in medication history.   Objective: BP 114/77   Pulse 75   Temp 97.8 F (36.6 C) (Rectal)   Resp 12   Ht 5\' 4"  (1.626 m)   Wt 61.7 kg   SpO2 99%   BMI 23.34 kg/m  Exam: General: Pleasant, elderly female in bed NAD Eyes: Reactive to light bilaterally ENTM: No lymphadenopathy Cardiovascular: Irregular, no murmur Respiratory: Normal effort on room air, CTA anteriorly Gastrointestinal: Soft, nontender, nondistended MSK: Moves all extremities bilaterally Derm: No wounds or ulcers appreciated Extremities: S/p amputation all toes on right side Psych: Euthymic mood and congruent affect Neuro: CN II: PERRL CN III, IV,VI: EOMI CV V: Normal sensation in V1, V2, V3 CVII: Symmetric smile and brow raise CN VIII: Normal hearing CN IX,X: Symmetric palate raise  CN XI: 5/5 shoulder shrug UE and LE strength 5/5 Normal sensation in UE and LE bilaterally     Labs:  CBC BMET  Recent Labs  Lab 10/17/22 0506 10/17/22 0526  WBC 8.0  --   HGB 10.2* 11.2*  HCT 31.9* 33.0*  PLT 271  --    Recent Labs  Lab 10/17/22 0506 10/17/22 0526  NA 129* 128*  K 4.2 4.1  CL 88*  --   CO2 25  --   BUN 18  --   CREATININE 5.45*  --   GLUCOSE 108*  --   CALCIUM 9.6  --     Pertinent additional labs: PT 17.1, INR 1.4 UA: Proteinuria, large leukocytes, few bacteria, positive squamous epithelial cells Negative UDS Pneumonia WNL Lactic acid 1.3 Free T4 1.49 TSH WNL  EKG:  sinus rhythm, QTC prolongation 518     Imaging Studies Performed:  CXR No acute cardiopulmonary abnormality.   Lance Muss, MD 10/17/2022, 2:18 PM PGY-1, [facility]  FPTS Intern pager: [phone], text pages welcome Secure chat group [facility] Teaching Service    AMS, unknown origin: med related? HD need?  CT head negative, no known infectious source with full skin  exam/abdominal exam/CXR unrevealing  Lactate negative, afebrile, normal CBC Ethanol, TSH, VBG, B1 and B12, blood cx  Ammonia, CMP negative  Does not appear to have meningitis/encephalitis concerns/no evidence of HTN emergency  Psych and nephro consult  Almost back of baseline   Hypotension and Hypothermia--transient  As noted negative for infectious work up EKG in AM (prolonged QT) Monitor overnight  If any evidence of infection, broad spec abx and repeat cultures with CXR   Upper Level Addendum:  I have seen and evaluated this patient along with Dr. Oda Cogan and reviewed the above note, making necessary revisions as appropriate.  I agree with the medical decision making and physical exam as noted above.  Alfredo Martinez, MD PGY-2 [facility] Family Medicine Residency

## 2022-10-17 NOTE — Assessment & Plan Note (Addendum)
Stable

## 2022-10-17 NOTE — ED Notes (Signed)
ED TO INPATIENT HANDOFF REPORT  ED Nurse Name and Phone #: [phone]  S Name/Age/Gender Robin [name] 72 y.o. female Room/Bed: 023C/023C  Code Status   Code Status: Full Code  Home/SNF/Other Home Patient oriented to: self, place, time, and situation Is this baseline? Yes   Triage Complete: Triage complete  Chief Complaint Hypotension [I95.9]  Triage Note Pt brought from home, released from [facility] hospital on Wed. Pt's daughter states since placed on trazodone 10mg  has been more altered than normal, lethargic, nonverbal and not able to complete ADL's, which is not her baseline. Pt normally does everything on her own.    Allergies No Known Allergies  Level of Care/Admitting Diagnosis ED Disposition     ED Disposition  Admit   Condition  --   Comment  Hospital Area: [facility] [[id number]]  Level of Care: Telemetry Medical [104]  May place patient in observation at [name] or [facility] if equivalent level of care is available:: Yes  Covid Evaluation: Asymptomatic - no recent exposure (last 10 days) testing not required  Diagnosis: Hypotension [960454]  Admitting Physician: Alfredo Martinez [[id number]]  Attending Physician: Doreene Eland [[id number]]          B Medical/Surgery History Past Medical History:  Diagnosis Date   Acute delirium 11/18/2014   Acute encephalopathy    Acute on chronic respiratory failure with hypoxia (HCC) 09/09/2016   Acute respiratory failure with hypoxia (HCC) 11/29/2015   Anemia in chronic kidney disease 12/09/2014   Anxiety    Arthritis    Benign hypertension    Bipolar affective disorder (HCC) 12/09/2014   CAD (coronary artery disease), native coronary artery with 2 stents  11/17/2014   Charcot foot due to diabetes mellitus (HCC)    Chronic combined systolic and diastolic CHF (congestive heart failure) (HCC) 11/18/2014   Closed left ankle fracture 11/17/2014   Confusion 01/21/2015   Depression    Diabetes mellitus without  complication (HCC)    End stage renal disease on dialysis (HCC)    Fracture dislocation of ankle 11/17/2014   GERD (gastroesophageal reflux disease)    Heart murmur    History of blood transfusion    History of bronchitis    History of pneumonia    Hyperlipidemia 03/26/2015   Hypertension associated with diabetes (HCC) 11/18/2014   Hypertensive heart/renal disease with failure (HCC) 12/09/2014   Hypokalemia 11/17/2014   Multiple falls 01/21/2015   Obesity 11/17/2014   Onychomycosis 10/07/2016   Right leg DVT (HCC) 12/05/2015   SIRS (systemic inflammatory response syndrome) (HCC) 08/08/2016   Sleep apnea    Unstable angina (HCC) 12/26/2017   Ventricular tachycardia (HCC)    Past Surgical History:  Procedure Laterality Date   A/V FISTULAGRAM N/A 01/03/2020   Procedure: A/V FISTULAGRAM;  Surgeon: Nada Libman, MD;  [facility];  Service: Cardiovascular;  Laterality: N/A;   A/V FISTULAGRAM Left 03/12/2021   Procedure: A/V FISTULAGRAM;  Surgeon: Nada Libman, MD;  [facility]: [facility];  Service: Cardiovascular;  Laterality: Left;   ABDOMINAL HYSTERECTOMY     ANGIOPLASTY Left 01/26/2020   Procedure: LEFT ARM GRAPH THROMBECTOMY WITH BALLON ANGIOPLASTY;  Surgeon: Maeola Harman, MD;  Location: [facility] OR;  Service: Vascular;  Laterality: Left;   ANKLE CLOSED REDUCTION N/A 11/17/2014   Procedure: CLOSED REDUCTION ANKLE;  Surgeon: Frederico Hamman, MD;  Location: [facility] OR;  Service: Orthopedics;  Laterality: N/A;   ANKLE FUSION Left 03/16/2015   Procedure: Left  Tibiocalcaneal Fusion;  Surgeon: Nadara Mustard, MD;  Location: [facility] OR;  Service: Orthopedics;  Laterality: Left;   APPLICATION OF WOUND VAC Right 08/06/2016   Procedure: APPLICATION OF PREVENA WOUND VAC;  Surgeon: Nadara Mustard, MD;  Location: [facility];  Service: Orthopedics;  Laterality: Right;   AV FISTULA PLACEMENT Left may-2016   done at [facility]   CARDIAC CATHETERIZATION     2 stent    CHOLECYSTECTOMY      CORONARY PRESSURE/FFR STUDY N/A 07/31/2022   Procedure: INTRAVASCULAR PRESSURE WIRE/FFR STUDY;  Surgeon: Orbie Pyo, MD;  [facility];  Service: Cardiovascular;  Laterality: N/A;   CORONARY STENT PLACEMENT     FISTULOGRAM Left 01/26/2020   Procedure: FISTULOGRAM;  Surgeon: Maeola Harman, MD;  Location: [facility] OR;  Service: Vascular;  Laterality: Left;   FOOT ARTHRODESIS Right 08/06/2016   Procedure: Right Foot Fusion Lisfranc Joint;  Surgeon: Nadara Mustard, MD;  Location: [facility] OR;  Service: Orthopedics;  Laterality: Right;   HARDWARE REMOVAL Left 03/16/2015   Procedure: Removal Hardware Left Ankle;  Surgeon: Nadara Mustard, MD;  Location: [facility] OR;  Service: Orthopedics;  Laterality: Left;   IR AV DIALY SHUNT INTRO NEEDLE/INTRACATH INITIAL W/PTA/IMG LEFT  01/05/2018   IR US GUIDE VASC ACCESS LEFT  01/05/2018   LEFT HEART CATH AND CORONARY ANGIOGRAPHY N/A 12/28/2017   Procedure: LEFT HEART CATH AND CORONARY ANGIOGRAPHY;  Surgeon: Kathleene Hazel, MD;  [facility];  Service: Cardiovascular;  Laterality: N/A;   LEFT HEART CATH AND CORONARY ANGIOGRAPHY N/A 07/31/2022   Procedure: LEFT HEART CATH AND CORONARY ANGIOGRAPHY;  Surgeon: Orbie Pyo, MD;  [facility];  Service: Cardiovascular;  Laterality: N/A;   LOWER EXTREMITY ANGIOGRAPHY N/A 03/21/2021   Procedure: LOWER EXTREMITY ANGIOGRAPHY;  Surgeon: Cephus Shelling, MD;  [facility];  Service: Cardiovascular;  Laterality: N/A;   ORIF ANKLE FRACTURE Left 11/20/2014   Procedure: OPEN REDUCTION INTERNAL FIXATION (ORIF) ANKLE FRACTURE;  Surgeon: Sheral Apley, MD;  [facility];  Service: Orthopedics;  Laterality: Left;   PERIPHERAL VASCULAR BALLOON ANGIOPLASTY  01/03/2020   Procedure: PERIPHERAL VASCULAR BALLOON ANGIOPLASTY;  Surgeon: Nada Libman, MD;  [facility];  Service: Cardiovascular;;   PERIPHERAL VASCULAR BALLOON ANGIOPLASTY Left  03/12/2021   Procedure: PERIPHERAL VASCULAR BALLOON ANGIOPLASTY;  Surgeon: Nada Libman, MD;  [facility];  Service: Cardiovascular;  Laterality: Left;   PERIPHERAL VASCULAR BALLOON ANGIOPLASTY  08/22/2021   Procedure: PERIPHERAL VASCULAR BALLOON ANGIOPLASTY;  Surgeon: Cephus Shelling, MD;  [facility];  Service: Cardiovascular;;  Left AVF PTA   TONSILLECTOMY       A IV Location/Drains/Wounds Patient Lines/Drains/Airways Status     Active Line/Drains/Airways     Name Placement date Placement time Site Days   Peripheral IV 10/17/22 20 G Right Antecubital 10/17/22  1610  Antecubital  less than 1   Fistula / Graft Left Upper arm Arteriovenous fistula 09/30/14  --  Upper arm  2939   Fistula / Graft Left Upper arm Arteriovenous fistula --  --  Upper arm  --   Fistula / Graft Left Upper arm --  --  Upper arm  --            Intake/Output Last 24 hours No intake or output data in the 24 hours ending 10/17/22 1339  Labs/Imaging Results for orders placed or performed during  the hospital encounter of 10/17/22 (from the past 48 hour(s))  Comprehensive metabolic panel     Status: Abnormal   Collection Time: 10/17/22  5:06 AM  Result Value Ref Range   Sodium 129 (L) 135 - 145 mmol/L   Potassium 4.2 3.5 - 5.1 mmol/L    Comment: HEMOLYSIS AT THIS LEVEL MAY AFFECT RESULT   Chloride 88 (L) 98 - 111 mmol/L   CO2 25 22 - 32 mmol/L   Glucose, Bld 108 (H) 70 - 99 mg/dL    Comment: Glucose reference range applies only to samples taken after fasting for at least 8 hours.   BUN 18 8 - 23 mg/dL   Creatinine, Ser 2.70 (H) 0.44 - 1.00 mg/dL   Calcium 9.6 8.9 - 35.0 mg/dL   Total Protein 6.6 6.5 - 8.1 g/dL   Albumin 3.1 (L) 3.5 - 5.0 g/dL   AST 45 (H) 15 - 41 U/L   ALT 16 0 - 44 U/L   Alkaline Phosphatase 141 (H) 38 - 126 U/L   Total Bilirubin 1.6 (H) 0.3 - 1.2 mg/dL   GFR, Estimated 8 (L) >60 mL/min    Comment: (NOTE) Calculated using the CKD-EPI  Creatinine Equation (2021)    Anion gap 16 (H) 5 - 15    Comment: Performed at [facility], [address] [place], [place] [address]  CBC with Differential/Platelet     Status: Abnormal   Collection Time: 10/17/22  5:06 AM  Result Value Ref Range   WBC 8.0 4.0 - 10.5 K/uL   RBC 3.12 (L) 3.87 - 5.11 MIL/uL   Hemoglobin 10.2 (L) 12.0 - 15.0 g/dL   HCT 82.9 (L) 93.7 - 16.9 %   MCV 102.2 (H) 80.0 - 100.0 fL   MCH 32.7 26.0 - 34.0 pg   MCHC 32.0 30.0 - 36.0 g/dL   RDW 67.8 (H) 93.8 - 10.1 %   Platelets 271 150 - 400 K/uL   nRBC 0.0 0.0 - 0.2 %   Neutrophils Relative % 68 %   Neutro Abs 5.5 1.7 - 7.7 K/uL   Lymphocytes Relative 18 %   Lymphs Abs 1.4 0.7 - 4.0 K/uL   Monocytes Relative 9 %   Monocytes Absolute 0.7 0.1 - 1.0 K/uL   Eosinophils Relative 3 %   Eosinophils Absolute 0.2 0.0 - 0.5 K/uL   Basophils Relative 1 %   Basophils Absolute 0.1 0.0 - 0.1 K/uL   Immature Granulocytes 1 %   Abs Immature Granulocytes 0.05 0.00 - 0.07 K/uL    Comment: Performed at [facility], [address], [place], [place] [address]  Protime-INR     Status: Abnormal   Collection Time: 10/17/22  5:06 AM  Result Value Ref Range   Prothrombin Time 17.1 (H) 11.4 - 15.2 seconds   INR 1.4 (H) 0.8 - 1.2    Comment: (NOTE) INR goal varies based on device and disease states. Performed at [facility], [address], [place], [place] [address]   CBG monitoring, ED     Status: None   Collection Time: 10/17/22  5:21 AM  Result Value Ref Range   Glucose-Capillary 92 70 - 99 mg/dL    Comment: Glucose reference range applies only to samples taken after fasting for at least 8 hours.  I-Stat venous blood gas, ED     Status: Abnormal   Collection Time: 10/17/22  5:26 AM  Result Value Ref Range   pH, Ven 7.449 (  H) 7.25 - 7.43   pCO2, Ven 46.3 44 - 60 mmHg   pO2, Ven 45 32 - 45 mmHg   Bicarbonate 32.1 (H) 20.0 - 28.0 mmol/L   TCO2 33 (H) 22 - 32 mmol/L   O2 Saturation 82 %    Acid-Base Excess 7.0 (H) 0.0 - 2.0 mmol/L   Sodium 128 (L) 135 - 145 mmol/L   Potassium 4.1 3.5 - 5.1 mmol/L   Calcium, Ion 1.11 (L) 1.15 - 1.40 mmol/L   HCT 33.0 (L) 36.0 - 46.0 %   Hemoglobin 11.2 (L) 12.0 - 15.0 g/dL   Sample type VENOUS   Urinalysis, w/ Reflex to Culture (Infection Suspected) -Urine, Catheterized     Status: Abnormal   Collection Time: 10/17/22  6:35 AM  Result Value Ref Range   Specimen Source URINE, CATHETERIZED    Color, Urine AMBER (A) YELLOW    Comment: BIOCHEMICALS MAY BE AFFECTED BY COLOR   APPearance CLOUDY (A) CLEAR   Specific Gravity, Urine 1.012 1.005 - 1.030   pH 8.0 5.0 - 8.0   Glucose, UA NEGATIVE NEGATIVE mg/dL   Hgb urine dipstick MODERATE (A) NEGATIVE   Bilirubin Urine NEGATIVE NEGATIVE   Ketones, ur NEGATIVE NEGATIVE mg/dL   Protein, ur >=130 (A) NEGATIVE mg/dL   Nitrite NEGATIVE NEGATIVE   Leukocytes,Ua LARGE (A) NEGATIVE   RBC / HPF >50 0 - 5 RBC/hpf   WBC, UA >50 0 - 5 WBC/hpf    Comment:        Reflex urine culture not performed if WBC <=10, OR if Squamous epithelial cells >5. If Squamous epithelial cells >5 suggest recollection.    Bacteria, UA FEW (A) NONE SEEN   Squamous Epithelial / HPF 6-10 0 - 5 /HPF   Amorphous Crystal PRESENT    Non Squamous Epithelial 0-5 (A) NONE SEEN    Comment: Performed at [facility], [address] [place], [place] [address]  Rapid urine drug screen (hospital performed)     Status: None   Collection Time: 10/17/22  6:38 AM  Result Value Ref Range   Opiates NONE DETECTED NONE DETECTED   Cocaine NONE DETECTED NONE DETECTED   Benzodiazepines NONE DETECTED NONE DETECTED   Amphetamines NONE DETECTED NONE DETECTED   Tetrahydrocannabinol NONE DETECTED NONE DETECTED   Barbiturates NONE DETECTED NONE DETECTED    Comment: (NOTE) DRUG SCREEN FOR MEDICAL PURPOSES ONLY.  IF CONFIRMATION IS NEEDED FOR ANY PURPOSE, NOTIFY LAB WITHIN 5 DAYS.  LOWEST DETECTABLE LIMITS FOR URINE DRUG SCREEN Drug  Class                     Cutoff (ng/mL) Amphetamine and metabolites    1000 Barbiturate and metabolites    200 Benzodiazepine                 200 Opiates and metabolites        300 Cocaine and metabolites        300 THC                            50 Performed at [facility], [address], [place], [place] [address]   Ammonia     Status: None   Collection Time: 10/17/22  8:11 AM  Result Value Ref Range   Ammonia 15 9 - 35 umol/L    Comment: Performed at [name], [address] [place], [place]  [address]  Lactic acid, plasma     Status: None   Collection Time: 10/17/22  8:11 AM  Result Value Ref Range   Lactic Acid, Venous 1.3 0.5 - 1.9 mmol/L    Comment: Performed at [facility], [address], [place], [place] [address]  T4, free     Status: Abnormal   Collection Time: 10/17/22  8:11 AM  Result Value Ref Range   Free T4 1.49 (H) 0.61 - 1.12 ng/dL    Comment: (NOTE) Biotin ingestion may interfere with free T4 tests. If the results are inconsistent with the TSH level, previous test results, or the clinical presentation, then consider biotin interference. If needed, order repeat testing after stopping biotin. Performed at [name], [address], [place], [place] [address]   TSH     Status: None   Collection Time: 10/17/22  8:11 AM  Result Value Ref Range   TSH 2.096 0.350 - 4.500 uIU/mL    Comment: Performed by a 3rd Generation assay with a functional sensitivity of <=0.01 uIU/mL. Performed at [facility], [address], [place], [place] [address]   CBG monitoring, ED     Status: None   Collection Time: 10/17/22  1:14 PM  Result Value Ref Range   Glucose-Capillary 89 70 - 99 mg/dL    Comment: Glucose reference range applies only to samples taken after fasting for at least 8 hours.   DG Chest Port 1 View  Result Date: 10/17/2022 CLINICAL DATA:  72 year old female recently hospitalized at the [place]. Altered mental status,  lethargy. EXAM: PORTABLE CHEST 1 VIEW COMPARISON:  CT Chest, Abdomen, and Pelvis 08/07/2022, and earlier. FINDINGS: Portable AP semi upright view at 0546 hours. Calcified aortic atherosclerosis. Stable mild cardiomegaly. Other mediastinal contours are within normal limits. Visualized tracheal air column is within normal limits. Stable lung volume since March. Chronic left costophrenic angle scarring. Otherwise when allowing for portable technique the lungs are clear. No pneumothorax. No acute osseous abnormality identified. Negative visible bowel gas. IMPRESSION: No acute cardiopulmonary abnormality. Electronically Signed   By: Odessa Fleming M.D.   On: 10/17/2022 06:01   CT HEAD WO CONTRAST  Result Date: 10/17/2022 CLINICAL DATA:  72 year old female recently hospitalized at the [place]. Altered mental status, lethargy. EXAM: CT HEAD WITHOUT CONTRAST TECHNIQUE: Contiguous axial images were obtained from the base of the skull through the vertex without intravenous contrast. RADIATION DOSE REDUCTION: This exam was performed according to the departmental dose-optimization program which includes automated exposure control, adjustment of the mA and/or kV according to patient size and/or use of iterative reconstruction technique. COMPARISON:  Head CT 07/29/2022.  Brain MRI 07/27/2020. FINDINGS: Brain: Stable cerebral volume. No midline shift, ventriculomegaly, mass effect, evidence of mass lesion, intracranial hemorrhage or evidence of cortically based acute infarction. Stable gray-white differentiation with mild for age periventricular white matter hypodensity. Incidental dural calcification along the falx and tentorium. No cortical encephalomalacia identified. Vascular: Severe Calcified atherosclerosis at the skull base. No suspicious intracranial vascular hyperdensity. Skull: No acute osseous abnormality identified. Sinuses/Orbits: Visualized paranasal sinuses and mastoids are stable and well aerated. Other: No acute orbit or  scalp soft tissue finding. Calcified scalp vessel atherosclerosis again noted. IMPRESSION: No acute intracranial abnormality. Stable mild for age white matter disease, advanced calcified atherosclerosis. Electronically Signed   By: Odessa Fleming M.D.   On: 10/17/2022 05:59    Pending Labs [employer] (From admission, onward)     Start  Ordered   10/18/22 0500  CBC  Daily,   R      10/17/22 1326   10/18/22 0500  Renal function panel  Daily,   R      10/17/22 1326   10/17/22 1243  Salicylate level  Add-on,   AD        10/17/22 1242   10/17/22 1243  Acetaminophen level  Add-on,   AD        10/17/22 1242   10/17/22 1242  HIV Antibody (routine testing w rflx)  (HIV Antibody (Routine testing w reflex) panel)  Add-on,   AD        10/17/22 1242   10/17/22 1242  RPR  Add-on,   AD        10/17/22 1242   10/17/22 1242  Vitamin B1  Add-on,   AD        10/17/22 1242   10/17/22 1242  Vitamin B12  Add-on,   AD        10/17/22 1242   10/17/22 1242  Ethanol  Add-on,   AD        10/17/22 1242   10/17/22 1224  Urine Culture  Once,   URGENT       Question:  Indication  Answer:  Sepsis   10/17/22 1223   10/17/22 0843  Urinalysis, w/ Reflex to Culture (Infection Suspected) -Urine, Catheterized  Once,   URGENT       Question:  Specimen Source  Answer:  Urine, Catheterized   10/17/22 0842   10/17/22 0525  Lactic acid, plasma  Now then every 2 hours,   R      10/17/22 0524   10/17/22 0520  Blood Culture (Routine X 2)  BLOOD CULTURE X 2,   R      10/17/22 0521            Vitals/Pain Today's Vitals   10/17/22 1121 10/17/22 1130 10/17/22 1245 10/17/22 1300  BP:  97/80 110/67 136/70  Pulse:      Resp:  19 12 17   Temp: 97.8 F (36.6 C)     TempSrc: Rectal     SpO2:      Weight:      Height:        Isolation Precautions No active isolations  Medications Medications  cefTRIAXone (ROCEPHIN) 1 g in sodium chloride 0.9 % 100 mL IVPB (has no administration in time range)  lactated ringers  bolus 500 mL (0 mLs Intravenous Stopped 10/17/22 0943)  dextrose 50 % solution 50 mL (50 mLs Intravenous Given 10/17/22 0611)  lactated ringers bolus 500 mL (0 mLs Intravenous Stopped 10/17/22 1307)    Mobility walks with device     Focused Assessments Neuro Assessment Handoff:  Swallow screen pass? Yes      Last date known well: 10/15/22   Neuro Assessment:   Neuro Checks:      Has TPA been given? No If patient is a Neuro Trauma and patient is going to OR before floor call report to 4N Charge nurse: [phone] or [phone]   R Recommendations: See Admitting Provider Note  Report given to:   Additional Notes:

## 2022-10-17 NOTE — Assessment & Plan Note (Addendum)
Continue SSI for now, CBGs at goal. Given 7 units short acting insulin yesterday. - Add LAI 3 U if becomes uncontrolled.

## 2022-10-17 NOTE — ED Notes (Addendum)
straight cath patient, urine very thick , unable to get a sample very small return. MD made aware

## 2022-10-17 NOTE — Assessment & Plan Note (Addendum)
On admission, initially patient had altered mental status.  She was recently discharged from the [facility] from 5/15-5/22 for psychosis.  She was discharged on 15 mg Abilify and 25 mg trazodone.  EKG on admission has prolonged QTc of 518.  Psychiatric medications may be contributing to prolonged QTc.  UDS negative, ammonia negative, lactic acid negative, glucose is 98, TSH WNL.  No leukocytosis and she is afebrile.  Patient has also not miss her HD sessions.  Per family, patient tends to become more altered with drastic changes in blood sugar.  Patient is now at baseline mentation which is specifically A&O x 3. to name, place, and date however not knowing the situation.  Will observe overnight and discussed with nephrology if they would like to do HD while she is in the hospital and monitor for clinical improvement. - Admit to FMTS, attending Dr. Lum Babe - Consult psychiatry, appreciate recommendations - Holding Abilify and trazodone pending psychiatry recommendations along with prolonged Qtc - BC pending - Vitamin B1 and vitamin B12 pending - Ethanol, salicylate level, acetaminophen level pending - RPR, HIV pending - Delirium precautions - AM CBC, RFP

## 2022-10-18 ENCOUNTER — Observation Stay (HOSPITAL_COMMUNITY): Payer: No Typology Code available for payment source

## 2022-10-18 DIAGNOSIS — F4320 Adjustment disorder, unspecified: Secondary | ICD-10-CM

## 2022-10-18 DIAGNOSIS — R4182 Altered mental status, unspecified: Secondary | ICD-10-CM | POA: Diagnosis not present

## 2022-10-18 DIAGNOSIS — I5042 Chronic combined systolic (congestive) and diastolic (congestive) heart failure: Secondary | ICD-10-CM | POA: Diagnosis not present

## 2022-10-18 DIAGNOSIS — E1122 Type 2 diabetes mellitus with diabetic chronic kidney disease: Secondary | ICD-10-CM | POA: Diagnosis not present

## 2022-10-18 DIAGNOSIS — I132 Hypertensive heart and chronic kidney disease with heart failure and with stage 5 chronic kidney disease, or end stage renal disease: Secondary | ICD-10-CM | POA: Diagnosis not present

## 2022-10-18 HISTORY — PX: IR FLUORO GUIDE CV LINE RIGHT: IMG2283

## 2022-10-18 HISTORY — PX: IR AV DIALY SHUNT INTRO NEEDLE/INTRACATH INITIAL W/PTA/IMG LEFT: IMG6103

## 2022-10-18 HISTORY — PX: IR US GUIDE VASC ACCESS RIGHT: IMG2390

## 2022-10-18 LAB — GLUCOSE, CAPILLARY
Glucose-Capillary: 113 mg/dL — ABNORMAL HIGH (ref 70–99)
Glucose-Capillary: 137 mg/dL — ABNORMAL HIGH (ref 70–99)
Glucose-Capillary: 216 mg/dL — ABNORMAL HIGH (ref 70–99)

## 2022-10-18 LAB — COMPREHENSIVE METABOLIC PANEL
ALT: 13 U/L (ref 0–44)
AST: 22 U/L (ref 15–41)
Albumin: 2.5 g/dL — ABNORMAL LOW (ref 3.5–5.0)
Alkaline Phosphatase: 132 U/L — ABNORMAL HIGH (ref 38–126)
Anion gap: 9 (ref 5–15)
BUN: 22 mg/dL (ref 8–23)
CO2: 27 mmol/L (ref 22–32)
Calcium: 8.8 mg/dL — ABNORMAL LOW (ref 8.9–10.3)
Chloride: 94 mmol/L — ABNORMAL LOW (ref 98–111)
Creatinine, Ser: 6.72 mg/dL — ABNORMAL HIGH (ref 0.44–1.00)
GFR, Estimated: 6 mL/min — ABNORMAL LOW (ref >60)
Glucose, Bld: 231 mg/dL — ABNORMAL HIGH (ref 70–99)
Potassium: 3.6 mmol/L (ref 3.5–5.1)
Sodium: 130 mmol/L — ABNORMAL LOW (ref 135–145)
Total Bilirubin: 0.6 mg/dL (ref 0.3–1.2)
Total Protein: 5.6 g/dL — ABNORMAL LOW (ref 6.5–8.1)

## 2022-10-18 LAB — CBC
HCT: 27.8 % — ABNORMAL LOW (ref 36.0–46.0)
Hemoglobin: 9 g/dL — ABNORMAL LOW (ref 12.0–15.0)
MCH: 32.3 pg (ref 26.0–34.0)
MCHC: 32.4 g/dL (ref 30.0–36.0)
MCV: 99.6 fL (ref 80.0–100.0)
Platelets: 204 10*3/uL (ref 150–400)
RBC: 2.79 MIL/uL — ABNORMAL LOW (ref 3.87–5.11)
RDW: 17.2 % — ABNORMAL HIGH (ref 11.5–15.5)
WBC: 6.4 10*3/uL (ref 4.0–10.5)
nRBC: 0 % (ref 0.0–0.2)

## 2022-10-18 LAB — HEPATITIS B SURFACE ANTIBODY, QUANTITATIVE: Hep B S AB Quant (Post): 6.6 m[IU]/mL — ABNORMAL LOW (ref >9.9)

## 2022-10-18 LAB — RPR: RPR Ser Ql: NONREACTIVE

## 2022-10-18 MED ORDER — LIDOCAINE-PRILOCAINE 2.5-2.5 % EX CREA
1.0000 | TOPICAL_CREAM | CUTANEOUS | Status: DC | PRN
  Filled 2022-10-18: qty 5

## 2022-10-18 MED ORDER — HEPARIN SODIUM (PORCINE) 1000 UNIT/ML IJ SOLN
INTRAMUSCULAR | Status: AC
  Filled 2022-10-18: qty 10

## 2022-10-18 MED ORDER — FENTANYL CITRATE (PF) 100 MCG/2ML IJ SOLN
INTRAMUSCULAR | Status: AC | PRN
  Administered 2022-10-18: 25 ug via INTRAVENOUS

## 2022-10-18 MED ORDER — LIDOCAINE HCL 1 % IJ SOLN
INTRAMUSCULAR | Status: AC
  Filled 2022-10-18: qty 20

## 2022-10-18 MED ORDER — PENTAFLUOROPROP-TETRAFLUOROETH EX AERO: 1.0000 | INHALATION_SPRAY | CUTANEOUS | Status: DC | PRN

## 2022-10-18 MED ORDER — FENTANYL CITRATE (PF) 100 MCG/2ML IJ SOLN
INTRAMUSCULAR | Status: AC
  Filled 2022-10-18: qty 2

## 2022-10-18 MED ORDER — CHLORHEXIDINE GLUCONATE CLOTH 2 % EX PADS
6.0000 | MEDICATED_PAD | Freq: Every day | CUTANEOUS | Status: DC
  Administered 2022-10-18 – 2022-10-20 (×3): 6 via TOPICAL

## 2022-10-18 MED ORDER — IOHEXOL 300 MG/ML  SOLN: 150.0000 mL | Freq: Once | INTRAMUSCULAR | Status: DC | PRN

## 2022-10-18 MED ORDER — LIDOCAINE HCL (PF) 1 % IJ SOLN
5.0000 mL | INTRAMUSCULAR | Status: DC | PRN
  Filled 2022-10-18: qty 5

## 2022-10-18 MED ORDER — ZINC OXIDE 40 % EX OINT
TOPICAL_OINTMENT | Freq: Three times a day (TID) | CUTANEOUS | Status: DC
  Filled 2022-10-18: qty 57

## 2022-10-18 MED ORDER — ACETAMINOPHEN 325 MG PO TABS
325.0000 mg | ORAL_TABLET | Freq: Once | ORAL | Status: AC
  Administered 2022-10-18: 325 mg via ORAL
  Filled 2022-10-18: qty 1

## 2022-10-18 MED ORDER — HEPARIN SODIUM (PORCINE) 1000 UNIT/ML IJ SOLN
INTRAMUSCULAR | Status: AC
  Administered 2022-10-18: 3800 [IU]
  Filled 2022-10-18: qty 4

## 2022-10-18 MED ORDER — MIDAZOLAM HCL 2 MG/2ML IJ SOLN
INTRAMUSCULAR | Status: AC | PRN
  Administered 2022-10-18: 1 mg via INTRAVENOUS

## 2022-10-18 MED ORDER — ALTEPLASE 2 MG IJ SOLR
INTRAMUSCULAR | Status: AC
  Filled 2022-10-18: qty 4

## 2022-10-18 MED ORDER — MIDAZOLAM HCL 2 MG/2ML IJ SOLN
INTRAMUSCULAR | Status: AC
  Filled 2022-10-18: qty 2

## 2022-10-18 NOTE — Progress Notes (Signed)
   10/18/22 1756  Vitals  Temp (!) 97.5 F (36.4 C)  Pulse Rate 73  Resp 16  BP (!) 133/52  SpO2 100 %  O2 Device Room Air  Weight  (unable to weigh)  Type of Weight Post-Dialysis  Oxygen Therapy  Patient Activity (if Appropriate) In bed  Pulse Oximetry Type Continuous  Oximetry Probe Site Changed Yes  Post Treatment  Dialyzer Clearance Lightly streaked  Duration of HD Treatment -hour(s) 3.3 hour(s)  Hemodialysis Intake (mL) 0 mL  Liters Processed 82  Fluid Removed (mL) 1300 mL  Tolerated HD Treatment Yes   Received patient in bed to unit.  Alert and oriented.  Informed consent signed and in chart.   TX duration  3.25  Patient tolerated well.  Transported back to the room  Alert, without acute distress.  Hand-off given to patient's nurse.   Access used: [facility] Access issues: no complications  Total UF removed: 1300 Medication(s) given: none   [name]

## 2022-10-18 NOTE — Progress Notes (Signed)
PT Cancellation Note  Patient Details Name: Robin [name] MRN: [id number] DOB: 08-25-1950   Cancelled Treatment:    Reason Eval/Treat Not Completed: Patient at procedure or test/unavailable. Will continue attempts.   [name] [facility] 10/18/2022, 9:25 AM Skip Mayer PT Acute Rehabilitation Services Office [phone]

## 2022-10-18 NOTE — Procedures (Signed)
Interventional Radiology Procedure Note  Procedure:   1.) Unsuccessful attempted declot.  There appears to be a rupture in the graft with a large hematoma in the upper arm.  Unable to cross occluded and heavily calcified graft.   2.) Placement of a 23 cm Palindrome TDC via the right IJ.  Tips in the RA and ready for use.   Complications: None  Estimated Blood Loss: None  Recommendations: - Routine line care  Signed,  Sterling Big, MD

## 2022-10-18 NOTE — Consult Note (Signed)
WOC Nurse Consult Note: Reason for Consult:right labia wound, full thickness, intertriginous dermatitis secondary to moisture Wound type:ICD  ICD-10 CM Codes for Irritant Dermatitis L24A9 - Due to friction or contact with other specified body fluids L30.4  - Erythema intertrigo. Also used for abrasion of the hand, chafing of the skin, dermatitis due to sweating and friction, friction dermatitis, friction eczema, and genital/thigh intertrigo.  Pressure Injury POA: N/A Measurement:1cm x 2cm x 0.1cm Wound bed:red, moist Drainage (amount, consistency, odor) scant serous Periwound: mild maceration Dressing procedure/placement/frequency:I have provided Nursing with guidance for the care of this lesion using three times daily application of Desitin ointment after cleansing. This may also be applied PRN. The Bedside RN's assistance with this consultation is appreciated.  WOC nursing team will not follow, but will remain available to this patient, the nursing and medical teams.  Please re-consult if needed.  Thank you for inviting [place] to participate in this patient's Plan of Care.  Ladona Mow, MSN, RN, CNS, GNP, Leda Min, [facility] [employer] phone:  [phone] .

## 2022-10-18 NOTE — Progress Notes (Signed)
[facility] Progress Note   Subjective:  Seen in room. Daughter at bedside. AVG clotted -plans for declot vs TDC with IR today.  She's alert and oriented to person and place this am. Says she wants to go home.  Denies cp, sob, n/v.   Objective Vitals:   10/17/22 1515 10/17/22 1525 10/17/22 1700 10/18/22 0836  BP: 123/66  120/75 127/71  Pulse:   90 73  Resp: 14 18 14 15   Temp:  98.5 F (36.9 C)  98.6 F (37 C)  TempSrc:    Oral  SpO2:   98% 100%  Weight:      Height:         Additional Objective Labs: Basic Metabolic Panel: Recent Labs  Lab 10/17/22 0506 10/17/22 0526 10/18/22 0200  NA 129* 128* 130*  K 4.2 4.1 3.6  CL 88*  --  94*  CO2 25  --  27  GLUCOSE 108*  --  231*  BUN 18  --  22  CREATININE 5.45*  --  6.72*  CALCIUM 9.6  --  8.8*   CBC: Recent Labs  Lab 10/17/22 0506 10/17/22 0526 10/18/22 0200  WBC 8.0  --  6.4  NEUTROABS 5.5  --   --   HGB 10.2* 11.2* 9.0*  HCT 31.9* 33.0* 27.8*  MCV 102.2*  --  99.6  PLT 271  --  204   Blood Culture    Component Value Date/Time   SDES BLOOD LEFT ARM 10/17/2022 0811   SPECREQUEST  10/17/2022 0811    BOTTLES DRAWN AEROBIC AND ANAEROBIC Blood Culture adequate volume   CULT  10/17/2022 0811    NO GROWTH < 24 HOURS Performed at [facility], [address] [place], [place] [address]    REPTSTATUS PENDING 10/17/2022 8413     Physical Exam General: Alert, lying in bed, nad Heart: RRR No murmur, no rub Lungs: Clear bilaterally  Abdomen: soft non-tender Extremities: no LE edema  Dialysis Access: LUE AVG no bruit   Medications:   apixaban  2.5 mg Oral BID WC   atorvastatin  80 mg Oral QHS   clopidogrel  75 mg Oral Daily   ferric citrate  420 mg Oral BID WC   insulin aspart  0-6 Units Subcutaneous TID WC   melatonin  5 mg Oral QHS   multivitamin  1 tablet Oral QHS   pantoprazole  40 mg Oral BID AC   umeclidinium bromide  1 puff Inhalation q morning    Dialysis Orders:  [facility] 3hrs61min, BFR 400, DFR Auto 1.5,  EDW 59kg, 2K/ 2Ca Heparin 5000 units with HD Mircera 100 mcg q2wks - last 5/13 Hectorol IV qHD - Last 5/22  Assessment/Plan: Altered Mental Status - Per primary. Suspected secondary to overmedication/hypoglycemia. Psych meds adjusted. MS improved today.   Hyponatremia - Euvolemic on exam. May be 2nd dehydration vs Trazodone (dose lowered). Need to be mindful on IVFs to prevent volume status from worsening ESRD -  on HD usually MWF. Unfortunately, AVG found to be clotted. IR consulted for thrombectomy vs possible TDC placement. Plan for HD  after vascular access is taken care of. Hypertension/volume  - Blood pressure soft/stable, euvolemic on exam, CXR unremarkable.  Anemia of CKD - Hgb 11.2 >9.0 . Resume ESA on 5/27.  Secondary Hyperparathyroidism -  Ca ok. Checking phos in AM.  Nutrition - NPO. Can be on renal diet once mental status returns to baseline DM - Per  primary  Tomasa Blase PA-C [facility] 10/18/2022,8:50 AM

## 2022-10-18 NOTE — Progress Notes (Signed)
PT Cancellation Note  Patient Details Name: Robin [name] MRN: [id number] DOB: 08-03-1950   Cancelled Treatment:    Reason Eval/Treat Not Completed: Patient at procedure or test/unavailable;Other (comment). Pt was in IR earlier, then eating lunch, then with oozing from transmet incision after OT and now going to HD. Will reattempt tomorrow.   [name] [facility] 10/18/2022, 1:45 PM Skip Mayer PT Acute [employer] [phone]

## 2022-10-18 NOTE — Consult Note (Addendum)
[facility] Face-to-Face Psychiatry Consult   Reason for Consult:  Altered Mental Status Referring Physician:  Dr. Lum Babe Patient Identification: Robin [name] MRN:  [id number] Principal Diagnosis: AMS (altered mental status) Diagnosis:  Principal Problem:   AMS (altered mental status) Active Problems:   Diabetes mellitus type 2, insulin dependent (HCC)   ESRD (end stage renal disease) (HCC)   Hypotension   Hyponatremia   Adjustment disorder   Total Time spent with patient: 1 hour  Subjective:   Robin [name] is a 72 y.o. female patient admitted with  Chief Complaint  Patient presents with   Altered Mental Status   .  HPI:   Per Primary Team: Robin [name] is a 72 y.o. female presenting with AMS, recent psychiatric admission in the [place]. [facility], [facility] thought was related to her psych medication change. She had improvement of mental status after ED arrival. Daughter had stopped trazodone, thought to be the cause of her confusion.    Patient notes that she can't stand her daughter, reports that she lives by herself with her grandson. She notes that she could cook and clean if she wanted to but that her daughter notes she would "burn the house down."   Spoke with patient's daughter, Omnia Mote.  She states that patient was recently at [facility] from 5/15 - 5/22 for psychosis.  At baseline, patient is able to do her ADLs but receives home care.  She reports the patient has not missed any hemodialysis sessions. For mentation, patient typically does not recall some recent memory but is able to state her name, date   Level 5 caveat    In the ED, amp of D50 was given.  Labs show low sodium.  Glucose showed 92.  UA was questionable for UTI so 1 dose of ceftriaxone was given.  On Interview: Patient seen sitting up in bed eating breakfast on my approach. She reports that she is doing well this morning. She has some confusion regarding the events that lead to  her hospitalization reporting that she is in the hospital due to a rash she obtained after a surgery.   She was asked about her daily routine. She reports that she currently lives alone and her grandson stays with her sporadically. She's able to explain her finances and how she takes care of herself. She reports that her daughter visited her last night and denies any issues with the visit.   She denies any SI/HI/AVH or paranoid concerns that the staff or anyone outside of the hospital would want to harm her. She reports sleeping fine last night.  Per nursing staff the patient became confused due to her glucose dropping below 100. Nursing staff has not noticed any issues with the patient's mentation since she became arousable.    Collateral  Daughter Yazlin Goodstein [phone] She reports that for the past two weeks the patient has not been acting like her normal self. The patient has been paranoid and concerned that someone recently died in her house. This is not typical behavior for the patient and she is usually able to care for herself. The patient called 911 because of her paranoia this alerted her to bring the patient to the hospital. She saw the patient last night and she feels like she is doing well enough to come home. She has a lot of family support and she should be safe at home. If she continues to have similar issues with paranoia  at home she plans on bringing the patient back to the hospital for mental health treatment.  Past Psychiatric History: Yes  Risk to Self:   No Risk to Others:   No Prior Inpatient Therapy:   Yes Prior Outpatient Therapy:   Yes  Past Medical History:  Past Medical History:  Diagnosis Date   Acute delirium 11/18/2014   Acute encephalopathy    Acute on chronic respiratory failure with hypoxia (HCC) 09/09/2016   Acute respiratory failure with hypoxia (HCC) 11/29/2015   Anemia in chronic kidney disease 12/09/2014   Anxiety    Arthritis    Benign hypertension     Bipolar affective disorder (HCC) 12/09/2014   CAD (coronary artery disease), native coronary artery with 2 stents  11/17/2014   Charcot foot due to diabetes mellitus (HCC)    Chronic combined systolic and diastolic CHF (congestive heart failure) (HCC) 11/18/2014   Closed left ankle fracture 11/17/2014   Confusion 01/21/2015   Depression    Diabetes mellitus without complication (HCC)    End stage renal disease on dialysis (HCC)    Fracture dislocation of ankle 11/17/2014   GERD (gastroesophageal reflux disease)    Heart murmur    History of blood transfusion    History of bronchitis    History of pneumonia    Hyperlipidemia 03/26/2015   Hypertension associated with diabetes (HCC) 11/18/2014   Hypertensive heart/renal disease with failure (HCC) 12/09/2014   Hypokalemia 11/17/2014   Multiple falls 01/21/2015   Obesity 11/17/2014   Onychomycosis 10/07/2016   Right leg DVT (HCC) 12/05/2015   SIRS (systemic inflammatory response syndrome) (HCC) 08/08/2016   Sleep apnea    Unstable angina (HCC) 12/26/2017   Ventricular tachycardia (HCC)     Past Surgical History:  Procedure Laterality Date   A/V FISTULAGRAM N/A 01/03/2020   Procedure: A/V FISTULAGRAM;  Surgeon: Nada Libman, MD;  [facility];  Service: Cardiovascular;  Laterality: N/A;   A/V FISTULAGRAM Left 03/12/2021   Procedure: A/V FISTULAGRAM;  Surgeon: Nada Libman, MD;  [facility];  Service: Cardiovascular;  Laterality: Left;   ABDOMINAL HYSTERECTOMY     ANGIOPLASTY Left 01/26/2020   Procedure: LEFT ARM GRAPH THROMBECTOMY WITH BALLON ANGIOPLASTY;  Surgeon: Maeola Harman, MD;  Location: [facility] OR;  Service: Vascular;  Laterality: Left;   ANKLE CLOSED REDUCTION N/A 11/17/2014   Procedure: CLOSED REDUCTION ANKLE;  Surgeon: Frederico Hamman, MD;  Location: [name] OR;  Service: Orthopedics;  Laterality: N/A;   ANKLE FUSION Left 03/16/2015   Procedure: Left Tibiocalcaneal Fusion;  Surgeon: Nadara Mustard,  MD;  [facility];  Service: Orthopedics;  Laterality: Left;   APPLICATION OF WOUND VAC Right 08/06/2016   Procedure: APPLICATION OF PREVENA WOUND VAC;  Surgeon: Nadara Mustard, MD;  [facility];  Service: Orthopedics;  Laterality: Right;   AV FISTULA PLACEMENT Left may-2016   done at [facility]   CARDIAC CATHETERIZATION     2 stent    CHOLECYSTECTOMY     CORONARY PRESSURE/FFR STUDY N/A 07/31/2022   Procedure: INTRAVASCULAR PRESSURE WIRE/FFR STUDY;  Surgeon: Orbie Pyo, MD;  [facility];  Service: Cardiovascular;  Laterality: N/A;   CORONARY STENT PLACEMENT     FISTULOGRAM Left 01/26/2020   Procedure: FISTULOGRAM;  Surgeon: Maeola Harman, MD;  Location: [facility] OR;  Service: Vascular;  Laterality: Left;   FOOT ARTHRODESIS Right 08/06/2016   Procedure: Right Foot Fusion Lisfranc Joint;  Surgeon: Aldean Baker  V, MD;  [facility];  Service: Orthopedics;  Laterality: Right;   HARDWARE REMOVAL Left 03/16/2015   Procedure: Removal Hardware Left Ankle;  Surgeon: Nadara Mustard, MD;  Location: [facility] OR;  Service: Orthopedics;  Laterality: Left;   IR AV DIALY SHUNT INTRO NEEDLE/INTRACATH INITIAL W/PTA/IMG LEFT  01/05/2018   IR US GUIDE VASC ACCESS LEFT  01/05/2018   LEFT HEART CATH AND CORONARY ANGIOGRAPHY N/A 12/28/2017   Procedure: LEFT HEART CATH AND CORONARY ANGIOGRAPHY;  Surgeon: Kathleene Hazel, MD;  [facility];  Service: Cardiovascular;  Laterality: N/A;   LEFT HEART CATH AND CORONARY ANGIOGRAPHY N/A 07/31/2022   Procedure: LEFT HEART CATH AND CORONARY ANGIOGRAPHY;  Surgeon: Orbie Pyo, MD;  [facility];  Service: Cardiovascular;  Laterality: N/A;   LOWER EXTREMITY ANGIOGRAPHY N/A 03/21/2021   Procedure: LOWER EXTREMITY ANGIOGRAPHY;  Surgeon: Cephus Shelling, MD;  [facility];  Service: Cardiovascular;  Laterality: N/A;   ORIF ANKLE FRACTURE Left 11/20/2014   Procedure: OPEN REDUCTION INTERNAL  FIXATION (ORIF) ANKLE FRACTURE;  Surgeon: Sheral Apley, MD;  [facility];  Service: Orthopedics;  Laterality: Left;   PERIPHERAL VASCULAR BALLOON ANGIOPLASTY  01/03/2020   Procedure: PERIPHERAL VASCULAR BALLOON ANGIOPLASTY;  Surgeon: Nada Libman, MD;  [facility];  Service: Cardiovascular;;   PERIPHERAL VASCULAR BALLOON ANGIOPLASTY Left 03/12/2021   Procedure: PERIPHERAL VASCULAR BALLOON ANGIOPLASTY;  Surgeon: Nada Libman, MD;  [facility];  Service: Cardiovascular;  Laterality: Left;   PERIPHERAL VASCULAR BALLOON ANGIOPLASTY  08/22/2021   Procedure: PERIPHERAL VASCULAR BALLOON ANGIOPLASTY;  Surgeon: Cephus Shelling, MD;  [facility];  Service: Cardiovascular;;  Left AVF PTA   TONSILLECTOMY     Family Psychiatric  History:  Social History:  Social History   Substance and Sexual Activity  Alcohol Use No     Social History   Substance and Sexual Activity  Drug Use No    Social History   Socioeconomic History   Marital status: Divorced    Spouse name: Not on file   Number of children: Not on file   Years of education: Not on file   Highest education level: Not on file  Occupational History   Not on file  Tobacco Use   Smoking status: Every Day    Packs/day: 1    Types: Cigarettes   Smokeless tobacco: Never  Vaping Use   Vaping Use: Never used  Substance and Sexual Activity   Alcohol use: No   Drug use: No   Sexual activity: Never  Other Topics Concern   Not on file  Social History Narrative   Not on file   Social Determinants of Health   Financial Resource Strain: Not on file  Food Insecurity: No Food Insecurity (10/17/2022)   Hunger Vital Sign    Worried About Running Out of Food in the Last Year: Never true    Ran Out of Food in the Last Year: Never true  Transportation Needs: No Transportation Needs (10/17/2022)   PRAPARE - Administrator, Civil Service (Medical): No    Lack of  Transportation (Non-Medical): No  Physical Activity: Not on file  Stress: Not on file  Social Connections: Not on file   Additional Social History:    Allergies:  No Known Allergies  Labs:  Results for orders placed or performed during the hospital encounter of 10/17/22 (from the past 48  hour(s))  Comprehensive metabolic panel     Status: Abnormal   Collection Time: 10/17/22  5:06 AM  Result Value Ref Range   Sodium 129 (L) 135 - 145 mmol/L   Potassium 4.2 3.5 - 5.1 mmol/L    Comment: HEMOLYSIS AT THIS LEVEL MAY AFFECT RESULT   Chloride 88 (L) 98 - 111 mmol/L   CO2 25 22 - 32 mmol/L   Glucose, Bld 108 (H) 70 - 99 mg/dL    Comment: Glucose reference range applies only to samples taken after fasting for at least 8 hours.   BUN 18 8 - 23 mg/dL   Creatinine, Ser 3.08 (H) 0.44 - 1.00 mg/dL   Calcium 9.6 8.9 - 65.7 mg/dL   Total Protein 6.6 6.5 - 8.1 g/dL   Albumin 3.1 (L) 3.5 - 5.0 g/dL   AST 45 (H) 15 - 41 U/L   ALT 16 0 - 44 U/L   Alkaline Phosphatase 141 (H) 38 - 126 U/L   Total Bilirubin 1.6 (H) 0.3 - 1.2 mg/dL   GFR, Estimated 8 (L) >60 mL/min    Comment: (NOTE) Calculated using the CKD-EPI Creatinine Equation (2021)    Anion gap 16 (H) 5 - 15    Comment: Performed at [facility] Lab, [address], [place], [place] [address]  CBC with Differential/Platelet     Status: Abnormal   Collection Time: 10/17/22  5:06 AM  Result Value Ref Range   WBC 8.0 4.0 - 10.5 K/uL   RBC 3.12 (L) 3.87 - 5.11 MIL/uL   Hemoglobin 10.2 (L) 12.0 - 15.0 g/dL   HCT 29.5 (L) 28.4 - 13.2 %   MCV 102.2 (H) 80.0 - 100.0 fL   MCH 32.7 26.0 - 34.0 pg   MCHC 32.0 30.0 - 36.0 g/dL   RDW 44.0 (H) 10.2 - 72.5 %   Platelets 271 150 - 400 K/uL   nRBC 0.0 0.0 - 0.2 %   Neutrophils Relative % 68 %   Neutro Abs 5.5 1.7 - 7.7 K/uL   Lymphocytes Relative 18 %   Lymphs Abs 1.4 0.7 - 4.0 K/uL   Monocytes Relative 9 %   Monocytes Absolute 0.7 0.1 - 1.0 K/uL   Eosinophils Relative 3 %   Eosinophils  Absolute 0.2 0.0 - 0.5 K/uL   Basophils Relative 1 %   Basophils Absolute 0.1 0.0 - 0.1 K/uL   Immature Granulocytes 1 %   Abs Immature Granulocytes 0.05 0.00 - 0.07 K/uL    Comment: Performed at [facility] Lab, [address] [place], [place] [address]  Protime-INR     Status: Abnormal   Collection Time: 10/17/22  5:06 AM  Result Value Ref Range   Prothrombin Time 17.1 (H) 11.4 - 15.2 seconds   INR 1.4 (H) 0.8 - 1.2    Comment: (NOTE) INR goal varies based on device and disease states. Performed at [facility], [address] [place], [place] [address]   Blood Culture (Routine X 2)     Status: None (Preliminary result)   Collection Time: 10/17/22  5:06 AM   Specimen: BLOOD  Result Value Ref Range   Specimen Description BLOOD RIGHT ANTECUBITAL    Special Requests      BOTTLES DRAWN AEROBIC AND ANAEROBIC Blood Culture results may not be optimal due to an inadequate volume of blood received in culture bottles   Culture      NO GROWTH 1 DAY Performed at [facility],  [address] [place], [place] [address]    Report Status PENDING   CBG monitoring, ED     Status: None   Collection Time: 10/17/22  5:21 AM  Result Value Ref Range   Glucose-Capillary 92 70 - 99 mg/dL    Comment: Glucose reference range applies only to samples taken after fasting for at least 8 hours.  I-Stat venous blood gas, ED     Status: Abnormal   Collection Time: 10/17/22  5:26 AM  Result Value Ref Range   pH, Ven 7.449 (H) 7.25 - 7.43   pCO2, Ven 46.3 44 - 60 mmHg   pO2, Ven 45 32 - 45 mmHg   Bicarbonate 32.1 (H) 20.0 - 28.0 mmol/L   TCO2 33 (H) 22 - 32 mmol/L   O2 Saturation 82 %   Acid-Base Excess 7.0 (H) 0.0 - 2.0 mmol/L   Sodium 128 (L) 135 - 145 mmol/L   Potassium 4.1 3.5 - 5.1 mmol/L   Calcium, Ion 1.11 (L) 1.15 - 1.40 mmol/L   HCT 33.0 (L) 36.0 - 46.0 %   Hemoglobin 11.2 (L) 12.0 - 15.0 g/dL   Sample type VENOUS   Urinalysis, w/ Reflex to Culture (Infection Suspected)  -Urine, Catheterized     Status: Abnormal   Collection Time: 10/17/22  6:35 AM  Result Value Ref Range   Specimen Source URINE, CATHETERIZED    Color, Urine AMBER (A) YELLOW    Comment: BIOCHEMICALS MAY BE AFFECTED BY COLOR   APPearance CLOUDY (A) CLEAR   Specific Gravity, Urine 1.012 1.005 - 1.030   pH 8.0 5.0 - 8.0   Glucose, UA NEGATIVE NEGATIVE mg/dL   Hgb urine dipstick MODERATE (A) NEGATIVE   Bilirubin Urine NEGATIVE NEGATIVE   Ketones, ur NEGATIVE NEGATIVE mg/dL   Protein, ur >=696 (A) NEGATIVE mg/dL   Nitrite NEGATIVE NEGATIVE   Leukocytes,Ua LARGE (A) NEGATIVE   RBC / HPF >50 0 - 5 RBC/hpf   WBC, UA >50 0 - 5 WBC/hpf    Comment:        Reflex urine culture not performed if WBC <=10, OR if Squamous epithelial cells >5. If Squamous epithelial cells >5 suggest recollection.    Bacteria, UA FEW (A) NONE SEEN   Squamous Epithelial / HPF 6-10 0 - 5 /HPF   Amorphous Crystal PRESENT    Non Squamous Epithelial 0-5 (A) NONE SEEN    Comment: Performed at [facility], [address], [place], [place] [address]  Rapid urine drug screen (hospital performed)     Status: None   Collection Time: 10/17/22  6:38 AM  Result Value Ref Range   Opiates NONE DETECTED NONE DETECTED   Cocaine NONE DETECTED NONE DETECTED   Benzodiazepines NONE DETECTED NONE DETECTED   Amphetamines NONE DETECTED NONE DETECTED   Tetrahydrocannabinol NONE DETECTED NONE DETECTED   Barbiturates NONE DETECTED NONE DETECTED    Comment: (NOTE) DRUG SCREEN FOR MEDICAL PURPOSES ONLY.  IF CONFIRMATION IS NEEDED FOR ANY PURPOSE, NOTIFY LAB WITHIN 5 DAYS.  LOWEST DETECTABLE LIMITS FOR URINE DRUG SCREEN Drug Class                     Cutoff (ng/mL) Amphetamine and metabolites    1000 Barbiturate and metabolites    200 Benzodiazepine                 200 Opiates and metabolites        300 Cocaine and metabolites  300 THC                            50 Performed at [facility] Lab, [address] [place], [place] [address]   Blood Culture (Routine X 2)     Status: None (Preliminary result)   Collection Time: 10/17/22  8:11 AM   Specimen: BLOOD LEFT ARM  Result Value Ref Range   Specimen Description BLOOD LEFT ARM    Special Requests      BOTTLES DRAWN AEROBIC AND ANAEROBIC Blood Culture adequate volume   Culture      NO GROWTH < 24 HOURS Performed at [facility] Lab, [address], [place], [place] [address]    Report Status PENDING   Ammonia     Status: None   Collection Time: 10/17/22  8:11 AM  Result Value Ref Range   Ammonia 15 9 - 35 umol/L    Comment: Performed at [facility] Lab, [address], [place], [place] [address]  Lactic acid, plasma     Status: None   Collection Time: 10/17/22  8:11 AM  Result Value Ref Range   Lactic Acid, Venous 1.3 0.5 - 1.9 mmol/L    Comment: Performed at [facility], [address] [place], [place] [address]  T4, free     Status: Abnormal   Collection Time: 10/17/22  8:11 AM  Result Value Ref Range   Free T4 1.49 (H) 0.61 - 1.12 ng/dL    Comment: (NOTE) Biotin ingestion may interfere with free T4 tests. If the results are inconsistent with the TSH level, previous test results, or the clinical presentation, then consider biotin interference. If needed, order repeat testing after stopping biotin. Performed at [facility] Lab, [address] [place], [place] [address]   TSH     Status: None   Collection Time: 10/17/22  8:11 AM  Result Value Ref Range   TSH 2.096 0.350 - 4.500 uIU/mL    Comment: Performed by a 3rd Generation assay with a functional sensitivity of <=0.01 uIU/mL. Performed at [facility], [address], [place], [place] [address]   HIV Antibody (routine testing w rflx)     Status: None   Collection Time: 10/17/22 12:40 PM  Result Value Ref Range   HIV Screen 4th Generation wRfx Non Reactive Non Reactive    Comment: Performed at [facility] Lab, [address] [place], [place]  [address]  RPR     Status: None   Collection Time: 10/17/22 12:40 PM  Result Value Ref Range   RPR Ser Ql NON REACTIVE NON REACTIVE    Comment: Performed at [facility], [address] [place], [place] [address]  Hepatitis B surface antibody,quantitative     Status: Abnormal   Collection Time: 10/17/22 12:40 PM  Result Value Ref Range   Hep B S AB Quant (Post) 6.6 (L) Immunity>9.9 mIU/mL    Comment: (NOTE)  Status of Immunity                     Anti-HBs Level  ------------------                     -------------- Inconsistent with Immunity                   0.0 - 9.9 Consistent with Immunity                          >  9.9 **Effective November 24, 2022 the reference interval**  will be changing to: Immunity >10 Performed At: [facility] [address] [place], [place] [id number] Jolene Schimke MD WU:[id number]   Vitamin B12     Status: Abnormal   Collection Time: 10/17/22 12:42 PM  Result Value Ref Range   Vitamin B-12 1,885 (H) 180 - 914 pg/mL    Comment: (NOTE) This assay is not validated for testing neonatal or myeloproliferative syndrome specimens for Vitamin B12 levels. Performed at [facility] Lab, [address], [place], [place] [address]   Ethanol     Status: None   Collection Time: 10/17/22 12:42 PM  Result Value Ref Range   Alcohol, Ethyl (B) <10 <10 mg/dL    Comment: (NOTE) Lowest detectable limit for serum alcohol is 10 mg/dL.  For medical purposes only. Performed at [facility] Lab, [address], [place], [place] [address]   Salicylate level     Status: Abnormal   Collection Time: 10/17/22 12:42 PM  Result Value Ref Range   Salicylate Lvl <7.0 (L) 7.0 - 30.0 mg/dL    Comment: Performed at [facility], [address] [place], [place] [address]  Acetaminophen level     Status: Abnormal   Collection Time: 10/17/22 12:42 PM  Result Value Ref Range   Acetaminophen (Tylenol), Serum <10 (L) 10 - 30 ug/mL    Comment: (NOTE) Therapeutic  concentrations vary significantly. A range of 10-30 ug/mL  may be an effective concentration for many patients. However, some  are best treated at concentrations outside of this range. Acetaminophen concentrations >150 ug/mL at 4 hours after ingestion  and >50 ug/mL at 12 hours after ingestion are often associated with  toxic reactions.  Performed at [name] Lab, [address] [place], [place] [address]   CBG monitoring, ED     Status: None   Collection Time: 10/17/22  1:14 PM  Result Value Ref Range   Glucose-Capillary 89 70 - 99 mg/dL    Comment: Glucose reference range applies only to samples taken after fasting for at least 8 hours.  Hepatitis B surface antigen     Status: None   Collection Time: 10/17/22  2:22 PM  Result Value Ref Range   Hepatitis B Surface Ag NON REACTIVE NON REACTIVE    Comment: Performed at [facility], [address], [place], [place] [address]  Glucose, capillary     Status: Abnormal   Collection Time: 10/17/22  3:27 PM  Result Value Ref Range   Glucose-Capillary 103 (H) 70 - 99 mg/dL    Comment: Glucose reference range applies only to samples taken after fasting for at least 8 hours.   Comment 1 Notify RN    Comment 2 Document in Chart   MRSA Next Gen by PCR, Nasal     Status: None   Collection Time: 10/17/22  3:50 PM   Specimen: Nasal Mucosa; Nasal Swab  Result Value Ref Range   MRSA by PCR Next Gen NOT DETECTED NOT DETECTED    Comment: (NOTE) The GeneXpert MRSA Assay (FDA approved for NASAL specimens only), is one component of a comprehensive MRSA colonization surveillance program. It is not intended to diagnose MRSA infection nor to guide or monitor treatment for MRSA infections. Test performance is not FDA approved in patients less than 5 years old. Performed at [name] Lab, [address], [place], [place] [address]   CBC     Status:  Abnormal   Collection Time: 10/18/22  2:00 AM  Result Value Ref Range   WBC 6.4 4.0  - 10.5 K/uL   RBC 2.79 (L) 3.87 - 5.11 MIL/uL   Hemoglobin 9.0 (L) 12.0 - 15.0 g/dL   HCT 08.6 (L) 57.8 - 46.9 %   MCV 99.6 80.0 - 100.0 fL   MCH 32.3 26.0 - 34.0 pg   MCHC 32.4 30.0 - 36.0 g/dL   RDW 62.9 (H) 52.8 - 41.3 %   Platelets 204 150 - 400 K/uL   nRBC 0.0 0.0 - 0.2 %    Comment: Performed at [facility] Lab, [address], [place], [place] [address]  Comprehensive metabolic panel     Status: Abnormal   Collection Time: 10/18/22  2:00 AM  Result Value Ref Range   Sodium 130 (L) 135 - 145 mmol/L   Potassium 3.6 3.5 - 5.1 mmol/L   Chloride 94 (L) 98 - 111 mmol/L   CO2 27 22 - 32 mmol/L   Glucose, Bld 231 (H) 70 - 99 mg/dL    Comment: Glucose reference range applies only to samples taken after fasting for at least 8 hours.   BUN 22 8 - 23 mg/dL   Creatinine, Ser 0.27 (H) 0.44 - 1.00 mg/dL   Calcium 8.8 (L) 8.9 - 10.3 mg/dL   Total Protein 5.6 (L) 6.5 - 8.1 g/dL   Albumin 2.5 (L) 3.5 - 5.0 g/dL   AST 22 15 - 41 U/L   ALT 13 0 - 44 U/L   Alkaline Phosphatase 132 (H) 38 - 126 U/L   Total Bilirubin 0.6 0.3 - 1.2 mg/dL   GFR, Estimated 6 (L) >60 mL/min    Comment: (NOTE) Calculated using the CKD-EPI Creatinine Equation (2021)    Anion gap 9 5 - 15    Comment: Performed at [facility] Lab, [address] [place], [place] [address]  Glucose, capillary     Status: Abnormal   Collection Time: 10/18/22 11:11 AM  Result Value Ref Range   Glucose-Capillary 113 (H) 70 - 99 mg/dL    Comment: Glucose reference range applies only to samples taken after fasting for at least 8 hours.    Current Facility-Administered Medications  Medication Dose Route Frequency Provider Last Rate Last Admin   albuterol (PROVENTIL) (2.5 MG/3ML) 0.083% nebulizer solution 2.5 mg  2.5 mg Inhalation Q6H PRN Alfredo Martinez, MD       apixaban (ELIQUIS) tablet 2.5 mg  2.5 mg Oral BID WC Maxwell, Allee, MD       atorvastatin (LIPITOR) tablet 80 mg  80 mg Oral QHS Maxwell, Allee, MD        Chlorhexidine Gluconate Cloth 2 % PADS 6 each  6 each Topical Daily Janit Pagan T, MD       clopidogrel (PLAVIX) tablet 75 mg  75 mg Oral Daily Maxwell, Allee, MD       ferric citrate (AURYXIA) tablet 420 mg  420 mg Oral BID WC Maxwell, Allee, MD   420 mg at 10/18/22 1108   insulin aspart (novoLOG) injection 0-6 Units  0-6 Units Subcutaneous TID WC Maxwell, Allee, MD       iohexol (OMNIPAQUE) 300 MG/ML solution 150 mL  150 mL Intravenous Once PRN Sterling Big, MD       melatonin tablet 5 mg  5 mg Oral QHS Vonna Drafts, MD   5 mg at 10/17/22 2258   multivitamin (RENA-VIT) tablet 1 tablet  1 tablet Oral  Ellyn Hack, MD   1 tablet at 10/17/22 2258   pantoprazole (PROTONIX) EC tablet 40 mg  40 mg Oral BID AC Maxwell, Allee, MD   40 mg at 10/18/22 1108   umeclidinium bromide (INCRUSE ELLIPTA) 62.5 MCG/ACT 1 puff  1 puff Inhalation q morning Alfredo Martinez, MD        Psychiatric Specialty Exam:  Presentation  General Appearance:  Appropriate for Environment  Eye Contact: Good  Speech: Normal Rate  Speech Volume: Normal  Handedness:No data recorded  Mood and Affect  Mood: Euthymic  Affect: Constricted   Thought Process  Thought Processes: Coherent  Descriptions of Associations:Intact  Orientation:Full (Time, Place and Person)  Thought Content:No data recorded History of Schizophrenia/Schizoaffective disorder:No data recorded Duration of Psychotic Symptoms:No data recorded Hallucinations:Hallucinations: None  Ideas of Reference:None  Suicidal Thoughts:Suicidal Thoughts: No  Homicidal Thoughts:Homicidal Thoughts: No   Sensorium  Memory:No data recorded Judgment: Good  Insight: Good   Executive Functions  Concentration: Good  Attention Span: Good  Recall: Good  Fund of Knowledge: Good  Language: Good   Psychomotor Activity  Psychomotor Activity:No data recorded  Assets  Assets:No data recorded  Sleep  Sleep: Sleep:  Good   Physical Exam: Physical Exam ROS Blood pressure 121/63, pulse 67, temperature 98.6 F (37 C), temperature source Oral, resp. rate 11, height 5\' 4"  (1.626 m), weight 61.7 kg, SpO2 100 %. Body mass index is 23.34 kg/m.  Treatment Plan Summary: Daily contact with patient to assess and evaluate symptoms and progress in treatment  Disposition:  Major Neurocognitive Disorder -Recommend outpatient Neurology Follow up -Agree with discontinuation of Trazodone -Agree with discontinuation of Abilify  Patient is clear from a psychiatric standpoint. Please reconsult if necessary.  Harlin Heys, DO 10/18/2022 11:49 AM

## 2022-10-18 NOTE — Progress Notes (Signed)
     Daily Progress Note Intern Pager: [phone]  [name] Medical record number: [id number] Date of birth: June 29, 1950 Age: 72 y.o. Gender: female  Primary Care Provider: [facility] Consultants: nephro, IR, psych Code Status: FULL  Pt Overview and Major Events to Date:  10/18/22: Admitted to FMTS  Assessment and Plan:  Robin [name] is a 72 y.o. female presenting with altered mental status possibly in setting of medication change. Improved today, hypotension and low temps resolved as well.   * AMS (altered mental status) Improved, no evidence of infectious etiology. Await psych recs for medication evaluation, continued monitoring. NOT on depakote or risperidone although noted on chart review in the past.   Hyponatremia Na 129 on admission.  Stable at 130, possibly medication associated.  - AM RFP  Hypotension Most likely in the setting of ESRD.  Patient initially was normotensive when she is brought in the hospital and now pressures are in the 90s/80s.  Patient also had hypothermia of 95.8 F but now has resolved to 97.8 F. - Continue to monitor  ESRD (end stage renal disease) (HCC) HD MWF.  GFR on admission is 8.  Nephro involved. Needs AVG evaluation with IR. NPO for possible TDC today. Appreciate assistance.   Diabetes mellitus type 2, insulin dependent (HCC) A1c in 07/2022 was 7.7.  Continue SSI for now, CBGs at goal. Add LAI 3 U if becomes uncontrolled.      FEN/GI: NPO PPx: Await AVG eval  Dispo:Pending AVG eval, may still be able to dispo today if we are able to get psychiatry recs.   Subjective:  Patient in no pain and doing quite well today.   Objective: Temp:  [97.8 F (36.6 C)-98.6 F (37 C)] 98.6 F (37 C) (05/25 0836) Pulse Rate:  [73-90] 73 (05/25 0836) Resp:  [12-19] 15 (05/25 0836) BP: (97-159)/(54-109) 127/71 (05/25 0836) SpO2:  [98 %-100 %] 100 % (05/25 0836) General: Alert in no apparent distress Heart: Regular  rate and rhythm with no murmurs appreciated Lungs: CTA bilaterally, no wheezing Abdomen: Bowel sounds present, no abdominal pain Skin: Warm and dry Extremities: No lower extremity edema   Laboratory: Most recent CBC Lab Results  Component Value Date   WBC 6.4 10/18/2022   HGB 9.0 (L) 10/18/2022   HCT 27.8 (L) 10/18/2022   MCV 99.6 10/18/2022   PLT 204 10/18/2022   Most recent BMP    Latest Ref Rng & Units 10/18/2022    2:00 AM  BMP  Glucose 70 - 99 mg/dL 295   BUN 8 - 23 mg/dL 22   Creatinine 6.21 - 1.00 mg/dL 3.08   Sodium 657 - 846 mmol/L 130   Potassium 3.5 - 5.1 mmol/L 3.6   Chloride 98 - 111 mmol/L 94   CO2 22 - 32 mmol/L 27   Calcium 8.9 - 10.3 mg/dL 8.8      Alfredo Martinez, MD 10/18/2022, 9:04 AM  PGY-2, [facility] FPTS Intern pager: [phone], text pages welcome Secure chat group [name] Teaching Service

## 2022-10-18 NOTE — Evaluation (Signed)
Occupational Therapy Evaluation Patient Details Name: Robin [name] MRN: [id number] DOB: 02/09/1951 Today's Date: 10/18/2022   History of Present Illness 72 Y/O F with brought in by her family for a change in her mental status from baseline.  PMHx of combined CHF, Bipolar disorder, CAD, DM2, ESRD, HTN, HLD, DVT, RLE transmetarsal ambutation and LLE 1st and 2nd toe amputations.She was recently discharged from the hospital for a mental health condition, and Trazadone was added to her medication regimen.   Clinical Impression   This 72 yo admitted with above presents to acute OT with PLOF of being Mod I to independent with all basic ADLs at RW level. Currently she is close to her baseline from an ADL perspective. She will continue to benefit from acute OT with follow up HHOT recommended.    Recommendations for follow up therapy are one component of a multi-disciplinary discharge planning process, led by the attending physician.  Recommendations may be updated based on patient status, additional functional criteria and insurance authorization.   Assistance Recommended at Discharge Frequent or constant Supervision/Assistance  Patient can return home with the following A little help with walking and/or transfers;A little help with bathing/dressing/bathroom;Assistance with cooking/housework;Assist for transportation    Functional Status Assessment  Patient has had a recent decline in their functional status and demonstrates the ability to make significant improvements in function in a reasonable and predictable amount of time.  Equipment Recommendations  None recommended by OT       Precautions / Restrictions Precautions Precautions: Fall Precaution Comments: left transmetarsal wound bleeding post ambulation (pt reports she sometimes wears shoes at home and sometimes does not Restrictions Weight Bearing Restrictions: No      Mobility Bed Mobility Overal bed mobility: Modified Independent              General bed mobility comments: HOB up    Transfers Overall transfer level: Needs assistance Equipment used: Rolling walker (2 wheels) Transfers: Sit to/from Stand Sit to Stand: Min guard           General transfer comment: VCs for safe hand placement      Balance Overall balance assessment: Mild deficits observed, not formally tested Sitting-balance support: No upper extremity supported, Feet supported Sitting balance-Leahy Scale: Good     Standing balance support: Bilateral upper extremity supported, Reliant on assistive device for balance Standing balance-Leahy Scale: Poor                             ADL either performed or assessed with clinical judgement   ADL Overall ADL's : Needs assistance/impaired Eating/Feeding: Independent;Sitting   Grooming: Set up;Sitting   Upper Body Bathing: Set up;Sitting   Lower Body Bathing: Min guard;Sit to/from stand   Upper Body Dressing : Set up;Sitting   Lower Body Dressing: Min guard;Sit to/from stand   Toilet Transfer: Min guard;Ambulation;Rolling walker (2 wheels) Toilet Transfer Details (indicate cue type and reason): simulated bed>door>bed with RW Toileting- Clothing Manipulation and Hygiene: Min guard;Sit to/from stand               Vision Patient Visual Report: No change from baseline              Pertinent Vitals/Pain Pain Assessment Pain Assessment: Faces Faces Pain Scale: Hurts little more Pain Location: LUE arm where fistula is (issues with it in IR earlier this AM) Pain Descriptors / Indicators: Sore Pain Intervention(s): Limited activity within patient's tolerance, Monitored during  session     Hand Dominance Right   Extremity/Trunk Assessment Upper Extremity Assessment Upper Extremity Assessment: Overall WFL for tasks assessed           Communication Communication Communication: No difficulties   Cognition Arousal/Alertness: Awake/alert Behavior During Therapy:  WFL for tasks assessed/performed Overall Cognitive Status: No family/caregiver present to determine baseline cognitive functioning                                 General Comments: A & O to month, year, day of week, where she was; but not why she is here.                Home Living Family/patient expects to be discharged to:: Private residence Living Arrangements: Other relatives (grandson, and grand daughter)   Type of Home: House Home Access: Stairs to enter Secretary/administrator of Steps: 2   Home Layout: One level     Bathroom Shower/Tub: Tub/shower unit;Walk-in shower   Bathroom Toilet: Standard     Home Equipment: Agricultural consultant (2 wheels);Shower seat;BSC/3in1;Wheelchair - manual          Prior Functioning/Environment Prior Level of Function : Independent/Modified Independent             Mobility Comments: able to self mobilize on RW ADLs Comments: Per pt report she is indep with all ADL's and IADL's (per chart her dtr won't let her cook because she will burn the house down)        OT Problem List: Impaired balance (sitting and/or standing)      OT Treatment/Interventions: Self-care/ADL training;DME and/or AE instruction;Patient/family education;Balance training    OT Goals(Current goals can be found in the care plan section) Acute Rehab OT Goals Patient Stated Goal: to go back home OT Goal Formulation: With patient Time For Goal Achievement: 11/01/22 Potential to Achieve Goals: Good  OT Frequency: Min 1X/week       AM-PAC OT "6 Clicks" Daily Activity     Outcome Measure Help from another person eating meals?: None Help from another person taking care of personal grooming?: A Little Help from another person toileting, which includes using toliet, bedpan, or urinal?: A Little Help from another person bathing (including washing, rinsing, drying)?: A Little Help from another person to put on and taking off regular upper body  clothing?: A Little Help from another person to put on and taking off regular lower body clothing?: A Little 6 Click Score: 19   End of Session Equipment Utilized During Treatment: Gait belt;Rolling walker (2 wheels) Nurse Communication:  (left transmetarsal wound bleeding post ambulation.)  Activity Tolerance: Patient tolerated treatment well Patient left: with bed alarm set (sitting on EOB)  OT Visit Diagnosis: Unsteadiness on feet (R26.81);Other abnormalities of gait and mobility (R26.89);Muscle weakness (generalized) (M62.81)                Time: 1610-9604 OT Time Calculation (min): 27 min Charges:  OT General Charges $OT Visit: 1 Visit OT Evaluation $OT Eval Moderate Complexity: 1 Mod OT Treatments $Self Care/Home Management : 8-22 mins  Lindon Romp OT Acute Rehabilitation Services Office [phone]    Evette Georges 10/18/2022, 2:58 PM

## 2022-10-18 NOTE — Progress Notes (Signed)
FMTS Brief Progress Note  S:Pt sleeping soundly with daughter also sleeping at bedside. Discussed pt w/ RN - no concerns   O: BP 120/75   Pulse 90   Temp 98.5 F (36.9 C)   Resp 14   Ht 5\' 4"  (1.626 m)   Wt 61.7 kg   SpO2 98%   BMI 23.34 kg/m    General: 72 year old female, sleeping soundly Cardio:well perfused Lungs: breathing comfortably on RA  A/P: AMS Improved Possibly d/t trazodone  Plan for psych consult  AM labs to monitor hyponatremia  Hypotension improved Continue plan per day team   Erick Alley, DO 10/18/2022, 12:21 AM PGY-2, [facility] Night Resident  Please page [phone] with questions.

## 2022-10-18 NOTE — Procedures (Signed)
HD Note:  Successful placement of 23 cm tip to cuff tunneled hemodialysis catheter via the right internal jugular vein with tips terminating within the superior aspect of the right atrium. The catheter is ready for immediate use.     Electronically Signed   By: Malachy Moan M.D.   On: 10/18/2022 13:06    Some information was entered later than the data was gathered due to patient care needs. The stated time with the data is accurate.  Patient arrived to the unit, alert and oriented to self.  See flow sheet for information of physical assessment.  Called patient's daughter with the witness of 2 Rns for verbal consent for dialysis treatment.  Reported to oncoming dialysis nurse.

## 2022-10-18 NOTE — Progress Notes (Signed)
OT Cancellation Note  Patient Details Name: ANESSA GALANIS MRN: [id number] DOB: 1951-01-04   Cancelled Treatment:    Reason Eval/Treat Not Completed: Patient at procedure or test/ unavailable (IR and then HD).  Lindon Romp OT Acute Rehabilitation Services Office [phone]    Evette Georges 10/18/2022, 11:43 AM

## 2022-10-19 DIAGNOSIS — R404 Transient alteration of awareness: Secondary | ICD-10-CM | POA: Diagnosis not present

## 2022-10-19 DIAGNOSIS — E878 Other disorders of electrolyte and fluid balance, not elsewhere classified: Secondary | ICD-10-CM | POA: Insufficient documentation

## 2022-10-19 LAB — RENAL FUNCTION PANEL
Albumin: 2.5 g/dL — ABNORMAL LOW (ref 3.5–5.0)
Anion gap: 8 (ref 5–15)
BUN: 9 mg/dL (ref 8–23)
CO2: 27 mmol/L (ref 22–32)
Calcium: 8.5 mg/dL — ABNORMAL LOW (ref 8.9–10.3)
Chloride: 96 mmol/L — ABNORMAL LOW (ref 98–111)
Creatinine, Ser: 3.7 mg/dL — ABNORMAL HIGH (ref 0.44–1.00)
GFR, Estimated: 13 mL/min — ABNORMAL LOW (ref >60)
Glucose, Bld: 129 mg/dL — ABNORMAL HIGH (ref 70–99)
Phosphorus: 2.1 mg/dL — ABNORMAL LOW (ref 2.5–4.6)
Potassium: 3.3 mmol/L — ABNORMAL LOW (ref 3.5–5.1)
Sodium: 131 mmol/L — ABNORMAL LOW (ref 135–145)

## 2022-10-19 LAB — CBC
HCT: 27.9 % — ABNORMAL LOW (ref 36.0–46.0)
Hemoglobin: 9 g/dL — ABNORMAL LOW (ref 12.0–15.0)
MCH: 32.3 pg (ref 26.0–34.0)
MCHC: 32.3 g/dL (ref 30.0–36.0)
MCV: 100 fL (ref 80.0–100.0)
Platelets: 194 10*3/uL (ref 150–400)
RBC: 2.79 MIL/uL — ABNORMAL LOW (ref 3.87–5.11)
RDW: 17.2 % — ABNORMAL HIGH (ref 11.5–15.5)
WBC: 6.5 10*3/uL (ref 4.0–10.5)
nRBC: 0 % (ref 0.0–0.2)

## 2022-10-19 LAB — GLUCOSE, CAPILLARY
Glucose-Capillary: 115 mg/dL — ABNORMAL HIGH (ref 70–99)
Glucose-Capillary: 265 mg/dL — ABNORMAL HIGH (ref 70–99)
Glucose-Capillary: 318 mg/dL — ABNORMAL HIGH (ref 70–99)
Glucose-Capillary: 171 mg/dL — ABNORMAL HIGH (ref 70–99)

## 2022-10-19 MED ORDER — CEPHALEXIN 500 MG PO CAPS
500.0000 mg | ORAL_CAPSULE | ORAL | Status: DC
  Administered 2022-10-20: 500 mg via ORAL
  Filled 2022-10-19: qty 1

## 2022-10-19 MED ORDER — EMPAGLIFLOZIN 10 MG PO TABS
10.0000 mg | ORAL_TABLET | Freq: Every day | ORAL | Status: DC
  Administered 2022-10-19 – 2022-10-21 (×3): 10 mg via ORAL
  Filled 2022-10-19 (×3): qty 1

## 2022-10-19 MED ORDER — DARBEPOETIN ALFA 100 MCG/0.5ML IJ SOSY
100.0000 ug | PREFILLED_SYRINGE | INTRAMUSCULAR | Status: DC
  Administered 2022-10-20: 100 ug via SUBCUTANEOUS
  Filled 2022-10-19: qty 0.5

## 2022-10-19 MED ORDER — INSULIN ASPART 100 UNIT/ML IJ SOLN: 0.0000 [IU] | Freq: Three times a day (TID) | INTRAMUSCULAR | Status: DC

## 2022-10-19 MED ORDER — INSULIN ASPART 100 UNIT/ML IJ SOLN
0.0000 [IU] | Freq: Every day | INTRAMUSCULAR | Status: DC
  Administered 2022-10-20: 3 [IU] via SUBCUTANEOUS

## 2022-10-19 MED ORDER — CEPHALEXIN 500 MG PO CAPS: 500.0000 mg | ORAL_CAPSULE | Freq: Two times a day (BID) | ORAL | Status: DC

## 2022-10-19 MED ORDER — NYSTATIN 100000 UNIT/GM EX CREA
1.0000 | TOPICAL_CREAM | Freq: Two times a day (BID) | CUTANEOUS | Status: DC
  Administered 2022-10-19 – 2022-10-20 (×4): 1 via TOPICAL
  Filled 2022-10-19: qty 30

## 2022-10-19 MED ORDER — CHLORHEXIDINE GLUCONATE CLOTH 2 % EX PADS
6.0000 | MEDICATED_PAD | Freq: Every day | CUTANEOUS | Status: DC
  Administered 2022-10-21: 6 via TOPICAL

## 2022-10-19 NOTE — Progress Notes (Addendum)
Daily Progress Note Intern Pager: [phone]  [name] Medical record number: [id number] Date of birth: Feb 25, 1951 Age: 72 y.o. Gender: female  Primary Care Provider: [facility] Consult[facility]: nephro, IR, psych  Code Status: FULL  Pt Overview and Major Events to Date:  10/18/22: Admitted to FMTS  Assessment and Plan: Robin [name] is a 72 y.o. female presenting with altered mental status possibly in setting of medication change. Improved today, hypotension and low temps resolved as well.   * Transient altered mental status Improved, no evidence of infectious etiology. Psychiatry evaluated patient and agreed to continue holding Abilify and Trazodone. Blood culture shows no more so far. Per patient's daughter, there is concern for UTI symptoms along with moist vaginal labia. - PT/OT - Start Keflex 500 mg BID on dialysis days for UTI PPX for 3 days - Start nystatin BID for 5 doses for pruritic labia  Electrolyte abnormality K 3.3 this AM. Patient on HD so management per nephrology.  Hyponatremia Stable at 131, possibly medication associated.  - AM RFP  Hypotension Stable, now normotensive. - Continue to monitor  ESRD (end stage renal disease) (HCC) HD MWF. Received HD on 5/25. IR completed tunneled catheter procedure.  Spoke with nephrology and plan for patient to stay in the hospital from another HD session tomorrow. -Appreciate IR -Appreciate nephrology  Diabetes mellitus type 2, insulin dependent (HCC) A1c in 07/2022 was 7.7.  Continue SSI for now, CBGs at goal. Add LAI 3 U if becomes uncontrolled.     FEN/GI: Renal PPx: Eliquis Dispo:Home  Subjective:  Patient seen this morning.  Patient's daughter at bedside.  Discussed extensively patient's improvement on mentation.  Objective: Temp:  [97.5 F (36.4 C)-98.6 F (37 C)] 98.3 F (36.8 C) (05/26 0800) Pulse Rate:  [72-92] 75 (05/26 0800) Resp:  [10-20] 15 (05/26 0800) BP:  (92-142)/(52-97) 107/54 (05/26 0800) SpO2:  [96 %-100 %] 99 % (05/26 0800) Physical Exam: General: Elderly female in bed alert and oriented x 3 to name, place, date.  NAD Cardiovascular: RRR, no murmur Respiratory: Normal effort, CTA anteriorly Abdomen: Soft, nontender, nondistended Extremities: No lower extremity edema  Laboratory: Most recent CBC Lab Results  Component Value Date   WBC 6.5 10/19/2022   HGB 9.0 (L) 10/19/2022   HCT 27.9 (L) 10/19/2022   MCV 100.0 10/19/2022   PLT 194 10/19/2022   Most recent BMP    Latest Ref Rng & Units 10/19/2022    2:49 AM  BMP  Glucose 70 - 99 mg/dL 147   BUN 8 - 23 mg/dL 9   Creatinine 8.29 - 5.62 mg/dL 1.30   Sodium 865 - 784 mmol/L 131   Potassium 3.5 - 5.1 mmol/L 3.3   Chloride 98 - 111 mmol/L 96   CO2 22 - 32 mmol/L 27   Calcium 8.9 - 10.3 mg/dL 8.5      Imaging/Diagnostic Tests: IR Guided Vascular Access IMPRESSION: Unsuccessful attempted declot of left upper extremity arteriovenous graft. The graft appears to be ruptured and thrombosed. There is an associated large hematoma in the distal left upper arm.   Successful placement of 23 cm tip to cuff tunneled hemodialysis catheter via the right internal jugular vein with tips terminating within the superior aspect of the right atrium. The catheter is ready for immediate use.  Lance Muss, MD 10/19/2022, 10:22 AM  PGY-1, [facility] FPTS Intern pager: [phone], text pages welcome Secure chat group [facility]  Hospital Teaching Service

## 2022-10-19 NOTE — Assessment & Plan Note (Signed)
Per nephro

## 2022-10-19 NOTE — Progress Notes (Signed)
[facility] Progress Note   Subjective:  Seen in room. Dialysis cathter placed by IR yesterday. Completed dialysis with 1.3L removed.  Planning for discharge. Daughter has concerns about patient being able to ambulate and transfer herself at her outpatient clinic. States she has been in bed for most of the week.   Objective Vitals:   10/18/22 1941 10/19/22 0007 10/19/22 0341 10/19/22 0800  BP: 114/65 119/61 115/60 (!) 107/54  Pulse: 92 76 75 75  Resp: 16 19 14 15   Temp:  98.6 F (37 C) 98.6 F (37 C) 98.3 F (36.8 C)  TempSrc:  Oral Oral Oral  SpO2: 100% 97% 100% 99%  Weight:      Height:         Additional Objective Labs: Basic Metabolic Panel: Recent Labs  Lab 10/17/22 0506 10/17/22 0526 10/18/22 0200 10/19/22 0249  NA 129* 128* 130* 131*  K 4.2 4.1 3.6 3.3*  CL 88*  --  94* 96*  CO2 25  --  27 27  GLUCOSE 108*  --  231* 129*  BUN 18  --  22 9  CREATININE 5.45*  --  6.72* 3.70*  CALCIUM 9.6  --  8.8* 8.5*  PHOS  --   --   --  2.1*    CBC: Recent Labs  Lab 10/17/22 0506 10/17/22 0526 10/18/22 0200 10/19/22 0249  WBC 8.0  --  6.4 6.5  NEUTROABS 5.5  --   --   --   HGB 10.2* 11.2* 9.0* 9.0*  HCT 31.9* 33.0* 27.8* 27.9*  MCV 102.2*  --  99.6 100.0  PLT 271  --  204 194    Blood Culture    Component Value Date/Time   SDES BLOOD LEFT ARM 10/17/2022 0811   SPECREQUEST  10/17/2022 0811    BOTTLES DRAWN AEROBIC AND ANAEROBIC Blood Culture adequate volume   CULT  10/17/2022 0811    NO GROWTH 2 DAYS Performed at [name], [address] [place], [place] [address]    REPTSTATUS PENDING 10/17/2022 6045     Physical Exam General: Alert, lying in bed, nad Heart: RRR No murmur, no rub Lungs: Clear bilaterally  Abdomen: soft non-tender Extremities: no LE edema  Dialysis Access: LUE AVG no bruit; new R IJ TDC inplace  Medications:   apixaban  2.5 mg Oral BID WC   atorvastatin  80 mg Oral QHS   Chlorhexidine Gluconate Cloth   6 each Topical Daily   clopidogrel  75 mg Oral Daily   insulin aspart  0-6 Units Subcutaneous TID WC   liver oil-zinc oxide   Topical TID   melatonin  5 mg Oral QHS   multivitamin  1 tablet Oral QHS   pantoprazole  40 mg Oral BID AC   umeclidinium bromide  1 puff Inhalation q morning    Dialysis Orders:  [facility] 3hrs79min, BFR 400, DFR Auto 1.5,  EDW 59kg, 2K/ 2Ca Heparin 5000 units with HD Mircera 100 mcg q2wks - last 5/13 Hectorol IV qHD - Last 5/22  Assessment/Plan: Altered Mental Status - Per primary. Suspected secondary to overmedication/hypoglycemia. Psych meds adjusted. MS improved today.   Hyponatremia - Improving. Continue to manage with UF on HD.  ESRD -  on HD usually MWF. HD off schedule Sat. Back on schedule Monday. Will write orders in case she remains in the hospital.  Access - AVG found to be clotted on admission. IR unable to declot (noted rupture in  graft with hematoma). TDC placed. Will need outpatient vascular follow up. Seen at [facility] -will arrange follow up.   Hypertension/volume  - Blood pressure soft/stable, euvolemic on exam, CXR unremarkable.  Anemia of CKD - Hgb 11.2 >9.0 . Resume ESA on 5/27.  Secondary Hyperparathyroidism -  Ca ok. Phos low - hold Auryxia binder here.  Nutrition - Continue renal diet.  DM - Per primary  Tomasa Blase PA-C [facility] 10/19/2022,9:08 AM

## 2022-10-19 NOTE — Progress Notes (Signed)
Physical Therapy Evaluation Patient Details Name: Robin [name] MRN: [id number] DOB: 1951-02-18 Today's Date: 10/19/2022  History of Present Illness  72 Y/O F with brought in by her family for a change in her mental status from baseline.  PMHx of combined CHF, Bipolar disorder, CAD, DM2, ESRD, HTN, HLD, DVT, RLE transmetarsal ambutation and LLE 1st and 2nd toe amputations.She was recently discharged from the hospital for a mental health condition, and Trazadone was added to her medication regimen.   Clinical Impression  Robin [name] is 72 y.o. female admitted with above HPI and diagnosis. Patient is currently limited by functional impairments below (see PT problem list). Patient lives with family and is independent with RW at baseline for household mobility, she has assistance from home aid 6 hrs/day and family in evening. Patient will benefit from continued skilled PT interventions to address impairments and progress independence with mobility. Acute PT will follow and progress as able.        Recommendations for follow up therapy are one component of a multi-disciplinary discharge planning process, led by the attending physician.  Recommendations may be updated based on patient status, additional functional criteria and insurance authorization.  Follow Up Recommendations       Assistance Recommended at Discharge Frequent or constant Supervision/Assistance  Patient can return home with the following  A little help with walking and/or transfers;A little help with bathing/dressing/bathroom;Help with stairs or ramp for entrance;Assist for transportation;Assistance with cooking/housework;Direct supervision/assist for medications management    Equipment Recommendations None recommended by PT  Recommendations for Other Services       Functional Status Assessment Patient has had a recent decline in their functional status and demonstrates the ability to make significant improvements in function in  a reasonable and predictable amount of time.     Precautions / Restrictions Precautions Precautions: Fall Precaution Comments: left transmetarsal wound bleeding post ambulation (pt reports she sometimes wears shoes at home and sometimes does not Restrictions Weight Bearing Restrictions: No      Mobility  Bed Mobility Overal bed mobility: Modified Independent             General bed mobility comments: use of bed features    Transfers Overall transfer level: Needs assistance Equipment used: Rolling walker (2 wheels) Transfers: Sit to/from Stand Sit to Stand: Supervision           General transfer comment: pt powers up with UE use, cues for reach back to sit.    Ambulation/Gait Ambulation/Gait assistance: Min guard Gait Distance (Feet): 90 Feet Assistive device: Rolling walker (2 wheels) Gait Pattern/deviations: Step-through pattern, Decreased stride length Gait velocity: decr        Stairs            Wheelchair Mobility    Modified Rankin (Stroke Patients Only)       Balance Overall balance assessment: Mild deficits observed, not formally tested Sitting-balance support: No upper extremity supported, Feet supported Sitting balance-Leahy Scale: Good     Standing balance support: Bilateral upper extremity supported, Reliant on assistive device for balance Standing balance-Leahy Scale: Poor                               Pertinent Vitals/Pain Pain Assessment Pain Assessment: No/denies pain Pain Intervention(s): Limited activity within patient's tolerance, Monitored during session, Repositioned    Home Living Family/patient expects to be discharged to:: Private residence Living Arrangements: Other relatives Available Help at  Discharge: Family;Available PRN/intermittently Type of Home: House Home Access: Stairs to enter   Entrance Stairs-Number of Steps: 2   Home Layout: One level Home Equipment: Agricultural consultant (2 wheels);Shower  seat;BSC/3in1;Wheelchair - manual      Prior Function Prior Level of Function : Independent/Modified Independent             Mobility Comments: able to self mobilize on RW ADLs Comments: Per pt report she is indep with all ADL's and IADL's (per chart her dtr won't let her cook because she will burn the house down)     Hand Dominance   Dominant Hand: Right    Extremity/Trunk Assessment   Upper Extremity Assessment Upper Extremity Assessment: Overall WFL for tasks assessed    Lower Extremity Assessment Lower Extremity Assessment: Overall WFL for tasks assessed    Cervical / Trunk Assessment Cervical / Trunk Assessment: Normal  Communication   Communication: No difficulties  Cognition Arousal/Alertness: Awake/alert Behavior During Therapy: WFL for tasks assessed/performed Overall Cognitive Status: Within Functional Limits for tasks assessed                                 General Comments: daughter reports pt is at baseline        General Comments      Exercises     Assessment/Plan    PT Assessment Patient needs continued PT services  PT Problem List Decreased strength;Decreased range of motion;Decreased activity tolerance;Decreased balance;Decreased coordination;Decreased cognition;Decreased safety awareness;Decreased mobility;Decreased skin integrity       PT Treatment Interventions DME instruction;Gait training;Stair training;Functional mobility training;Therapeutic activities;Therapeutic exercise;Balance training;Patient/family education;Cognitive remediation    PT Goals (Current goals can be found in the Care Plan section)  Acute Rehab PT Goals Patient Stated Goal: get home today PT Goal Formulation: With patient Time For Goal Achievement: 10/23/22 Potential to Achieve Goals: Good    Frequency Min 3X/week     Co-evaluation               AM-PAC PT "6 Clicks" Mobility  Outcome Measure Help needed turning from your back to your  side while in a flat bed without using bedrails?: None Help needed moving from lying on your back to sitting on the side of a flat bed without using bedrails?: None Help needed moving to and from a bed to a chair (including a wheelchair)?: A Little Help needed standing up from a chair using your arms (e.g., wheelchair or bedside chair)?: A Little Help needed to walk in hospital room?: A Little Help needed climbing 3-5 steps with a railing? : A Little 6 Click Score: 20    End of Session Equipment Utilized During Treatment: Gait belt Activity Tolerance: Patient tolerated treatment well Patient left: in chair;with call bell/phone within reach Nurse Communication: Mobility status PT Visit Diagnosis: Other abnormalities of gait and mobility (R26.89);Muscle weakness (generalized) (M62.81);Difficulty in walking, not elsewhere classified (R26.2)    Time: 1610-9604 PT Time Calculation (min) (ACUTE ONLY): 19 min   Charges:   PT Evaluation $PT Eval Low Complexity: 1 Low          Wynn Maudlin, DPT Acute Rehabilitation Services Office [phone]  10/19/22 12:07 PM

## 2022-10-20 DIAGNOSIS — R4182 Altered mental status, unspecified: Secondary | ICD-10-CM | POA: Diagnosis not present

## 2022-10-20 DIAGNOSIS — R29818 Other symptoms and signs involving the nervous system: Secondary | ICD-10-CM

## 2022-10-20 LAB — RENAL FUNCTION PANEL
Albumin: 2.5 g/dL — ABNORMAL LOW (ref 3.5–5.0)
Anion gap: 9 (ref 5–15)
BUN: 16 mg/dL (ref 8–23)
CO2: 26 mmol/L (ref 22–32)
Calcium: 8.7 mg/dL — ABNORMAL LOW (ref 8.9–10.3)
Chloride: 96 mmol/L — ABNORMAL LOW (ref 98–111)
Creatinine, Ser: 5.54 mg/dL — ABNORMAL HIGH (ref 0.44–1.00)
GFR, Estimated: 8 mL/min — ABNORMAL LOW (ref >60)
Glucose, Bld: 280 mg/dL — ABNORMAL HIGH (ref 70–99)
Phosphorus: 3.1 mg/dL (ref 2.5–4.6)
Potassium: 3.3 mmol/L — ABNORMAL LOW (ref 3.5–5.1)
Sodium: 131 mmol/L — ABNORMAL LOW (ref 135–145)

## 2022-10-20 LAB — GLUCOSE, CAPILLARY
Glucose-Capillary: 173 mg/dL — ABNORMAL HIGH (ref 70–99)
Glucose-Capillary: 268 mg/dL — ABNORMAL HIGH (ref 70–99)
Glucose-Capillary: 251 mg/dL — ABNORMAL HIGH (ref 70–99)

## 2022-10-20 LAB — CBC
HCT: 27.2 % — ABNORMAL LOW (ref 36.0–46.0)
Hemoglobin: 8.9 g/dL — ABNORMAL LOW (ref 12.0–15.0)
MCH: 32.4 pg (ref 26.0–34.0)
MCHC: 32.7 g/dL (ref 30.0–36.0)
MCV: 98.9 fL (ref 80.0–100.0)
Platelets: 188 10*3/uL (ref 150–400)
RBC: 2.75 MIL/uL — ABNORMAL LOW (ref 3.87–5.11)
RDW: 17.6 % — ABNORMAL HIGH (ref 11.5–15.5)
WBC: 5.9 10*3/uL (ref 4.0–10.5)
nRBC: 0 % (ref 0.0–0.2)

## 2022-10-20 MED ORDER — NYSTATIN 100000 UNIT/GM EX CREA: 1.0000 | TOPICAL_CREAM | Freq: Two times a day (BID) | CUTANEOUS | 0 refills | Status: DC

## 2022-10-20 MED ORDER — EMPAGLIFLOZIN 10 MG PO TABS: 10.0000 mg | ORAL_TABLET | Freq: Every day | ORAL | 0 refills | Status: DC

## 2022-10-20 MED ORDER — LEVEMIR FLEXTOUCH 100 UNIT/ML ~~LOC~~ SOPN: 3.0000 [IU] | PEN_INJECTOR | Freq: Every day | SUBCUTANEOUS | 0 refills | Status: DC

## 2022-10-20 MED ORDER — HEPARIN SODIUM (PORCINE) 1000 UNIT/ML IJ SOLN
INTRAMUSCULAR | Status: AC
  Filled 2022-10-20: qty 6

## 2022-10-20 MED ORDER — NYSTATIN 100000 UNIT/GM EX CREA: 1.0000 | TOPICAL_CREAM | Freq: Two times a day (BID) | CUTANEOUS | 0 refills | Status: AC

## 2022-10-20 MED ORDER — POTASSIUM CHLORIDE CRYS ER 20 MEQ PO TBCR: 40.0000 meq | EXTENDED_RELEASE_TABLET | Freq: Once | ORAL | Status: DC

## 2022-10-20 MED ORDER — HEPARIN SODIUM (PORCINE) 1000 UNIT/ML DIALYSIS
3000.0000 [IU] | Freq: Once | INTRAMUSCULAR | Status: AC
  Administered 2022-10-20: 3000 [IU] via INTRAVENOUS_CENTRAL

## 2022-10-20 MED ORDER — CEPHALEXIN 500 MG PO CAPS: 500.0000 mg | ORAL_CAPSULE | ORAL | 0 refills | Status: DC

## 2022-10-20 MED ORDER — HEPARIN SODIUM (PORCINE) 1000 UNIT/ML DIALYSIS: 1000.0000 [IU] | INTRAMUSCULAR | Status: DC | PRN

## 2022-10-20 NOTE — Discharge Summary (Incomplete)
Family Medicine Teaching [facility] Discharge Summary  [name] Medical record number: [id number] Date of birth: [birth year] Age: 72 y.o. Gender: female Date of Admission: 10/17/2022  Date of Discharge: 10/20/22 Admitting Physician: Alfredo Martinez, MD  Primary Care Provider: [facility] Medical Consultants: Nephrology, IR, psychiatry  Indication for Hospitalization: Transient change in mentation, hypotension, transient low temperature   Discharge Diagnoses/Problem List:  Principal Problem for Admission: Transient mentation change  Other Problems addressed during stay:  Principal Problem:   Transient altered mental status Active Problems:   Diabetes mellitus type 2, insulin dependent (HCC)   ESRD (end stage renal disease) (HCC)   Hypotension   Hyponatremia   Adjustment disorder   Electrolyte abnormality   Transient neurologic deficit    Brief Hospital Course:  Robin [name] is a 72 y.o.female with a history of DM2, ESRD, hypotension, hyponatremia, Adjustment disorder, electrolyte abnormality who was admitted to the [facility] at [facility] for transient change in mentation and hypotension. Her hospital course is detailed below:   AMS (altered mental status) Patient was recently discharged from the [facility] from 5/15 - 5/22 for psychosis.  She was discharged on Abilify 15 mg and trazodone 25 mg.  On admission, patient initially was hypotensive with pressures in the 90s/80s along with hypothermia of 95.8 F.  Initially, was lethargic. However, improved to baseline mentation knowing her name, location, and the date.  We obtained blood cultures, which did not show growth.  We held her Abilify and trazodone due to prolonged Qtc and concern that they contributed to mentation change, psychiatry recommended holding these medications as well in consult.  Ethanol, salicylate level, and acetaminophen level were negative. UA concerning for possible UTI however  squamous cells were present and Ucx not obtained.  Patient was given a 3-day course of Keflex on dialysis days because daughter was concerned that she was having symptoms of a UTI, although patient did not describe these symptoms.    Patient was also started on nystatin cream due to vulvar irritation, again not vocalized by the patient but by daughter.  ESRD Hemodialysis on Monday Wednesday Friday.  IR completed tunneled catheter procedure after inability to perform thrombectomy on AVG and patient received HD on 5/25 and 5/27.  Patient tolerated the sessions well. [facility]   Diabetes mellitus type 2 A1c in 07/2022 was 7.7.  Continued SSI and monitor CBGs.  Added Jardiance which was cleared by nephrology.  Discharged on 3 units of long-acting insulin.  Other chronic conditions were medically managed with home medications and formulary alternatives as necessary   PCP Follow-up Recommendations: Follow with nephrology and [facility] at [facility] to evaluate [facility], has established care already Repeat RFP and CBC on PCP follow up Assess mentation at follow up Complete Nystatin cream, assess for symptoms  Complete Keflex course, needs two more days  Follow up with psychiatry for medication changes, holding Abilify and trazodone on dispo Ensure has appropriate HH assistance  Disposition: Home with [name]  Discharge Condition: Stable    Discharge Exam:  Vitals:   10/20/22 1706 10/20/22 1712  BP: 107/87 113/72  Pulse: 97 (!) 20  Resp: 13 16  Temp:  97.8 F (36.6 C)  SpO2: 96% 97%   General: Alert in no apparent distress Heart: Regular rate and rhythm with no murmurs appreciated Lungs: CTA bilaterally, no wheezing Abdomen: no abdominal pain Skin: Warm and dry Extremities:  No lower extremity edema, bandage over TDC--no strikethrough   Significant Procedures: Attempted thrombectomy, TDC formation   Significant Labs and Imaging:  Recent Labs   Lab 10/19/22 0249 10/20/22 0245  WBC 6.5 5.9  HGB 9.0* 8.9*  HCT 27.9* 27.2*  PLT 194 188   Recent Labs  Lab 10/19/22 0249 10/20/22 0245  NA 131* 131*  K 3.3* 3.3*  CL 96* 96*  CO2 27 26  GLUCOSE 129* 280*  BUN 9 16  CREATININE 3.70* 5.54*  CALCIUM 8.5* 8.7*  PHOS 2.1* 3.1  ALBUMIN 2.5* 2.5*    IR AV DIALY SHUNT INTRO NEEDLE/INTRACATH INITIAL W/PTA/IMG LEFT  Result Date: 10/18/2022 INDICATION: 72 year old female with end-stage renal disease on hemodialysis. She has a chronic left upper extremity brachial basilic straight arteriovenous graft. History of multiple prior interventions. Last intervened on at our institution in August of 2019. EXAM: FISTULOGRAM TUNNELED CENTRAL VENOUS HEMODIALYSIS CATHETER PLACEMENT WITH ULTRASOUND AND FLUOROSCOPIC GUIDANCE MEDICATIONS: None. ANESTHESIA/SEDATION: Moderate (conscious) sedation was employed during this procedure. A total of Versed 1 mg and Fentanyl 25 mcg was administered intravenously. Moderate Sedation Time: 37 minutes. The patient's level of consciousness and vital signs were monitored continuously by radiology nursing throughout the procedure under my direct supervision. FLUOROSCOPY TIME:  Radiation exposure index: 3 mGy COMPLICATIONS: None immediate. PROCEDURE: The patient was initially placed supine on the table with the left arm exposed. Sterile prep and drape were performed. The graft was evaluated with ultrasound. The graft is completely thrombosed. In the region of the access zone there is an aneurysmal segment and just proximal to this is a heavily calcified segment. Additionally, there is a large hematoma deep to the graft in the lower aspect of the upper arm. Local anesthesia was attained by infiltration with 1% lidocaine. A small dermatotomy was made. Under real-time ultrasound guidance, the graft was accessed near the arterial anastomosis. A 0.018 wire was advanced in the graft followed by the micro puncture wire. Unfortunately,  the wire could not be passed beyond the heavily calcified segment of the graft in the access zone. Attempts were also made with a glidewire under fluoroscopic guidance. Small amount contrast was injected through the micro sheath as well confirming that the micro puncture sheath is within the graft. The however, the Glidewire continuously prolapses through the rent in the graft material and into the hematoma within the arm. After several minutes, further attempts were determined to the futile. The micro puncture sheath was removed and hemostasis obtained. Informed written consent was obtained from the patient after a discussion of the risks, benefits, and alternatives to treatment. Questions regarding the procedure were encouraged and answered. The right neck and chest were prepped with chlorhexidine in a sterile fashion, and a sterile drape was applied covering the operative field. Maximum barrier sterile technique with sterile gowns and gloves were used for the procedure. A timeout was performed prior to the initiation of the procedure. After creating a small venotomy incision, a micropuncture kit was utilized to access the right internal jugular vein under direct, real-time ultrasound guidance after the overlying soft tissues were anesthetized with 1% lidocaine with epinephrine. Ultrasound image documentation was performed. The microwire was kinked to measure appropriate catheter length. A stiff Glidewire was advanced to the level of the IVC and the micropuncture sheath was exchanged for a peel-away sheath. A palindrome tunneled hemodialysis catheter measuring 23 cm from tip to cuff was tunneled in a retrograde fashion from the anterior chest wall to the venotomy incision. The catheter  was then placed through the peel-away sheath with tips ultimately positioned within the superior aspect of the right atrium. Final catheter positioning was confirmed and documented with a spot radiographic image. The catheter  aspirates and flushes normally. The catheter was flushed with appropriate volume heparin dwells. The catheter exit site was secured with a 0-Prolene retention suture. The venotomy incision was closed with an interrupted 4-0 Vicryl, Dermabond and Steri-strips. Dressings were applied. The patient tolerated the procedure well without immediate post procedural complication. IMPRESSION: Unsuccessful attempted declot of left upper extremity arteriovenous graft. The graft appears to be ruptured and thrombosed. There is an associated large hematoma in the distal left upper arm. Successful placement of 23 cm tip to cuff tunneled hemodialysis catheter via the right internal jugular vein with tips terminating within the superior aspect of the right atrium. The catheter is ready for immediate use. Electronically Signed   By: Malachy Moan M.D.   On: 10/18/2022 13:06   IR Fluoro Guide CV Line Right  Result Date: 10/18/2022 INDICATION: 72 year old female with end-stage renal disease on hemodialysis. She has a chronic left upper extremity brachial basilic straight arteriovenous graft. History of multiple prior interventions. Last intervened on at our institution in August of 2019. EXAM: FISTULOGRAM TUNNELED CENTRAL VENOUS HEMODIALYSIS CATHETER PLACEMENT WITH ULTRASOUND AND FLUOROSCOPIC GUIDANCE MEDICATIONS: None. ANESTHESIA/SEDATION: Moderate (conscious) sedation was employed during this procedure. A total of Versed 1 mg and Fentanyl 25 mcg was administered intravenously. Moderate Sedation Time: 37 minutes. The patient's level of consciousness and vital signs were monitored continuously by radiology nursing throughout the procedure under my direct supervision. FLUOROSCOPY TIME:  Radiation exposure index: 3 mGy COMPLICATIONS: None immediate. PROCEDURE: The patient was initially placed supine on the table with the left arm exposed. Sterile prep and drape were performed. The graft was evaluated with ultrasound. The graft is  completely thrombosed. In the region of the access zone there is an aneurysmal segment and just proximal to this is a heavily calcified segment. Additionally, there is a large hematoma deep to the graft in the lower aspect of the upper arm. Local anesthesia was attained by infiltration with 1% lidocaine. A small dermatotomy was made. Under real-time ultrasound guidance, the graft was accessed near the arterial anastomosis. A 0.018 wire was advanced in the graft followed by the micro puncture wire. Unfortunately, the wire could not be passed beyond the heavily calcified segment of the graft in the access zone. Attempts were also made with a glidewire under fluoroscopic guidance. Small amount contrast was injected through the micro sheath as well confirming that the micro puncture sheath is within the graft. The however, the Glidewire continuously prolapses through the rent in the graft material and into the hematoma within the arm. After several minutes, further attempts were determined to the futile. The micro puncture sheath was removed and hemostasis obtained. Informed written consent was obtained from the patient after a discussion of the risks, benefits, and alternatives to treatment. Questions regarding the procedure were encouraged and answered. The right neck and chest were prepped with chlorhexidine in a sterile fashion, and a sterile drape was applied covering the operative field. Maximum barrier sterile technique with sterile gowns and gloves were used for the procedure. A timeout was performed prior to the initiation of the procedure. After creating a small venotomy incision, a micropuncture kit was utilized to access the right internal jugular vein under direct, real-time ultrasound guidance after the overlying soft tissues were anesthetized with 1% lidocaine with epinephrine. Ultrasound image  documentation was performed. The microwire was kinked to measure appropriate catheter length. A stiff Glidewire  was advanced to the level of the IVC and the micropuncture sheath was exchanged for a peel-away sheath. A palindrome tunneled hemodialysis catheter measuring 23 cm from tip to cuff was tunneled in a retrograde fashion from the anterior chest wall to the venotomy incision. The catheter was then placed through the peel-away sheath with tips ultimately positioned within the superior aspect of the right atrium. Final catheter positioning was confirmed and documented with a spot radiographic image. The catheter aspirates and flushes normally. The catheter was flushed with appropriate volume heparin dwells. The catheter exit site was secured with a 0-Prolene retention suture. The venotomy incision was closed with an interrupted 4-0 Vicryl, Dermabond and Steri-strips. Dressings were applied. The patient tolerated the procedure well without immediate post procedural complication. IMPRESSION: Unsuccessful attempted declot of left upper extremity arteriovenous graft. The graft appears to be ruptured and thrombosed. There is an associated large hematoma in the distal left upper arm. Successful placement of 23 cm tip to cuff tunneled hemodialysis catheter via the right internal jugular vein with tips terminating within the superior aspect of the right atrium. The catheter is ready for immediate use. Electronically Signed   By: Malachy Moan M.D.   On: 10/18/2022 13:06   IR US Guide Vasc Access Right  Result Date: 10/18/2022 INDICATION: 72 year old female with end-stage renal disease on hemodialysis. She has a chronic left upper extremity brachial basilic straight arteriovenous graft. History of multiple prior interventions. Last intervened on at our institution in August of 2019. EXAM: FISTULOGRAM TUNNELED CENTRAL VENOUS HEMODIALYSIS CATHETER PLACEMENT WITH ULTRASOUND AND FLUOROSCOPIC GUIDANCE MEDICATIONS: None. ANESTHESIA/SEDATION: Moderate (conscious) sedation was employed during this procedure. A total of Versed 1 mg  and Fentanyl 25 mcg was administered intravenously. Moderate Sedation Time: 37 minutes. The patient's level of consciousness and vital signs were monitored continuously by radiology nursing throughout the procedure under my direct supervision. FLUOROSCOPY TIME:  Radiation exposure index: 3 mGy COMPLICATIONS: None immediate. PROCEDURE: The patient was initially placed supine on the table with the left arm exposed. Sterile prep and drape were performed. The graft was evaluated with ultrasound. The graft is completely thrombosed. In the region of the access zone there is an aneurysmal segment and just proximal to this is a heavily calcified segment. Additionally, there is a large hematoma deep to the graft in the lower aspect of the upper arm. Local anesthesia was attained by infiltration with 1% lidocaine. A small dermatotomy was made. Under real-time ultrasound guidance, the graft was accessed near the arterial anastomosis. A 0.018 wire was advanced in the graft followed by the micro puncture wire. Unfortunately, the wire could not be passed beyond the heavily calcified segment of the graft in the access zone. Attempts were also made with a glidewire under fluoroscopic guidance. Small amount contrast was injected through the micro sheath as well confirming that the micro puncture sheath is within the graft. The however, the Glidewire continuously prolapses through the rent in the graft material and into the hematoma within the arm. After several minutes, further attempts were determined to the futile. The micro puncture sheath was removed and hemostasis obtained. Informed written consent was obtained from the patient after a discussion of the risks, benefits, and alternatives to treatment. Questions regarding the procedure were encouraged and answered. The right neck and chest were prepped with chlorhexidine in a sterile fashion, and a sterile drape was applied covering the operative field.  Maximum barrier sterile  technique with sterile gowns and gloves were used for the procedure. A timeout was performed prior to the initiation of the procedure. After creating a small venotomy incision, a micropuncture kit was utilized to access the right internal jugular vein under direct, real-time ultrasound guidance after the overlying soft tissues were anesthetized with 1% lidocaine with epinephrine. Ultrasound image documentation was performed. The microwire was kinked to measure appropriate catheter length. A stiff Glidewire was advanced to the level of the IVC and the micropuncture sheath was exchanged for a peel-away sheath. A palindrome tunneled hemodialysis catheter measuring 23 cm from tip to cuff was tunneled in a retrograde fashion from the anterior chest wall to the venotomy incision. The catheter was then placed through the peel-away sheath with tips ultimately positioned within the superior aspect of the right atrium. Final catheter positioning was confirmed and documented with a spot radiographic image. The catheter aspirates and flushes normally. The catheter was flushed with appropriate volume heparin dwells. The catheter exit site was secured with a 0-Prolene retention suture. The venotomy incision was closed with an interrupted 4-0 Vicryl, Dermabond and Steri-strips. Dressings were applied. The patient tolerated the procedure well without immediate post procedural complication. IMPRESSION: Unsuccessful attempted declot of left upper extremity arteriovenous graft. The graft appears to be ruptured and thrombosed. There is an associated large hematoma in the distal left upper arm. Successful placement of 23 cm tip to cuff tunneled hemodialysis catheter via the right internal jugular vein with tips terminating within the superior aspect of the right atrium. The catheter is ready for immediate use. Electronically Signed   By: Malachy Moan M.D.   On: 10/18/2022 13:06   DG Chest Port 1 View  Result Date:  10/17/2022 CLINICAL DATA:  72 year old female recently hospitalized at the [place]. Altered mental status, lethargy. EXAM: PORTABLE CHEST 1 VIEW COMPARISON:  CT Chest, Abdomen, and Pelvis 08/07/2022, and earlier. FINDINGS: Portable AP semi upright view at 0546 hours. Calcified aortic atherosclerosis. Stable mild cardiomegaly. Other mediastinal contours are within normal limits. Visualized tracheal air column is within normal limits. Stable lung volume since March. Chronic left costophrenic angle scarring. Otherwise when allowing for portable technique the lungs are clear. No pneumothorax. No acute osseous abnormality identified. Negative visible bowel gas. IMPRESSION: No acute cardiopulmonary abnormality. Electronically Signed   By: Odessa Fleming M.D.   On: 10/17/2022 06:01   CT HEAD WO CONTRAST  Result Date: 10/17/2022 CLINICAL DATA:  72 year old female recently hospitalized at the [place]. Altered mental status, lethargy. EXAM: CT HEAD WITHOUT CONTRAST TECHNIQUE: Contiguous axial images were obtained from the base of the skull through the vertex without intravenous contrast. RADIATION DOSE REDUCTION: This exam was performed according to the departmental dose-optimization program which includes automated exposure control, adjustment of the mA and/or kV according to patient size and/or use of iterative reconstruction technique. COMPARISON:  Head CT 07/29/2022.  Brain MRI 07/27/2020. FINDINGS: Brain: Stable cerebral volume. No midline shift, ventriculomegaly, mass effect, evidence of mass lesion, intracranial hemorrhage or evidence of cortically based acute infarction. Stable gray-white differentiation with mild for age periventricular white matter hypodensity. Incidental dural calcification along the falx and tentorium. No cortical encephalomalacia identified. Vascular: Severe Calcified atherosclerosis at the skull base. No suspicious intracranial vascular hyperdensity. Skull: No acute osseous abnormality identified.  Sinuses/Orbits: Visualized paranasal sinuses and mastoids are stable and well aerated. Other: No acute orbit or scalp soft tissue finding. Calcified scalp vessel atherosclerosis again noted. IMPRESSION: No acute intracranial abnormality. Stable mild for  age white matter disease, advanced calcified atherosclerosis. Electronically Signed   By: Odessa Fleming M.D.   On: 10/17/2022 05:59     Results/Tests Pending at Time of Discharge: Bcx x2  Discharge Medications:  Allergies as of 10/20/2022   No Known Allergies      Medication List     STOP taking these medications    ARIPiprazole 2 MG tablet Commonly known as: ABILIFY       TAKE these medications    acetaminophen 325 MG tablet Commonly known as: TYLENOL Take 325 mg by mouth 2 (two) times daily.   albuterol 108 (90 Base) MCG/ACT inhaler Commonly known as: VENTOLIN HFA Inhale 1-2 puffs into the lungs every 6 (six) hours as needed for wheezing or shortness of breath.   apixaban 5 MG Tabs tablet Commonly known as: ELIQUIS Take 2.5 mg by mouth 2 (two) times daily with a meal.   atorvastatin 80 MG tablet Commonly known as: LIPITOR Take 1 tablet (80 mg total) by mouth at bedtime.   CALCIUM PO Take 1 tablet by mouth daily.   cephALEXin 500 MG capsule Commonly known as: KEFLEX Take 1 capsule (500 mg total) by mouth every Monday, Wednesday, and Friday with hemodialysis for 2 doses.   clopidogrel 75 MG tablet Commonly known as: PLAVIX Take 1 tablet (75 mg total) by mouth daily.   diclofenac Sodium 1 % Gel Commonly known as: VOLTAREN Apply 4 g topically 4 (four) times daily as needed (for back pain).   empagliflozin 10 MG Tabs tablet Commonly known as: JARDIANCE Take 1 tablet (10 mg total) by mouth daily. Start taking on: Oct 21, 2022   ferric citrate 1 GM 210 MG(Fe) tablet Commonly known as: AURYXIA Take 420 mg by mouth 2 (two) times daily with a meal.   insulin aspart 100 UNIT/ML injection Commonly known as:  novoLOG Inject 6 Units into the skin 3 (three) times daily as needed for high blood sugar (CBG >300).   isosorbide mononitrate 30 MG 24 hr tablet Commonly known as: IMDUR Take 1 tablet (30 mg total) by mouth daily with breakfast.   Levemir FlexTouch 100 UNIT/ML FlexPen Generic drug: insulin detemir Inject 3 Units into the skin daily. Can increase after discussing with physician What changed:  how much to take additional instructions   Melatonin 10 MG Tabs Take 10 mg by mouth at bedtime.   metoprolol succinate 25 MG 24 hr tablet Commonly known as: TOPROL-XL Take 1 tablet (25 mg total) by mouth daily.   multivitamin Tabs tablet Take 1 tablet by mouth at bedtime.   nitroGLYCERIN 0.4 MG SL tablet Commonly known as: NITROSTAT Place 0.4 mg under the tongue every 5 (five) minutes as needed for chest pain.   nystatin cream Commonly known as: MYCOSTATIN Apply 1 Application topically 2 (two) times daily for 5 doses.   pantoprazole 40 MG tablet Commonly known as: PROTONIX Take 40 mg by mouth daily.   Spiriva Respimat 1.25 MCG/ACT Aers Generic drug: Tiotropium Bromide Monohydrate Inhale 2 puffs into the lungs every morning.         Discharge Instructions: Please refer to Patient Instructions section of EMR for full details.  Patient was counseled important signs and symptoms that should prompt return to medical care, changes in medications, dietary instructions, activity restrictions, and follow up appointments.   Follow-Up Appointments:  Follow-up Information     Center, [facility]. Schedule an appointment as soon as possible for a visit in 1 week(s).   Specialty:  General Practice Contact information: [address] [place] [address] [phone]                Kizzie Ide, MD PGY-1 Psychiatry   Alfredo Martinez, MD 10/20/2022, 5:37 PM PGY-2, [name]

## 2022-10-20 NOTE — Progress Notes (Signed)
   10/20/22 1712  Vitals  Temp 97.8 F (36.6 C)  Pulse Rate (!) 20  Resp 16  BP 113/72  SpO2 97 %  O2 Device Room Air  Post Treatment  Dialyzer Clearance Clear  Duration of HD Treatment -hour(s) 3.5 hour(s)  Hemodialysis Intake (mL) 0 mL  Liters Processed 73.5  Fluid Removed (mL) 1500 mL  Tolerated HD Treatment Yes   Received patient in bed to unit.  Alert and oriented.  Informed consent signed and in chart.   TX duration:3.5hrs  Patient tolerated well.  Transported back to the room  Alert, without acute distress.  Hand-off given to patient's nurse.   Access used: [facility] Access issues: none  Total UF removed: 1.5L  Medication(s) given: none    [facility]

## 2022-10-20 NOTE — Progress Notes (Signed)
Occupational Therapy Treatment Patient Details Name: SARIANNA HEATON MRN: [id number] DOB: 29-May-1950 Today's Date: 10/20/2022   History of present illness 72 Y/O F with brought in by her family for a change in her mental status from baseline.  PMHx of combined CHF, Bipolar disorder, CAD, DM2, ESRD, HTN, HLD, DVT, RLE transmetarsal ambutation and LLE 1st and 2nd toe amputations.She was recently discharged from the hospital for a mental health condition, and Trazadone was added to her medication regimen.   OT comments  Pt making progress with functional goals. Pt pleasant and cooperative, no c/o pain before activity and during ADL and ADL mobility activity using RW min guard A - Sup. OT will continue to follow acutely to maximize level of function and safety   Recommendations for follow up therapy are one component of a multi-disciplinary discharge planning process, led by the attending physician.  Recommendations may be updated based on patient status, additional functional criteria and insurance authorization.    Assistance Recommended at Discharge Frequent or constant Supervision/Assistance  Patient can return home with the following  A little help with walking and/or transfers;A little help with bathing/dressing/bathroom;Assistance with cooking/housework;Assist for transportation   Equipment Recommendations  None recommended by OT    Recommendations for Other Services      Precautions / Restrictions Precautions Precautions: Fall Restrictions Weight Bearing Restrictions: No       Mobility Bed Mobility Overal bed mobility: Modified Independent             General bed mobility comments: used rail    Transfers Overall transfer level: Needs assistance Equipment used: Rolling walker (2 wheels) Transfers: Sit to/from Stand Sit to Stand: Supervision           General transfer comment: pt stood at EOB for posterior hygiene, pt unaware that she had a BM in before standing      Balance Overall balance assessment: Mild deficits observed, not formally tested Sitting-balance support: No upper extremity supported, Feet supported Sitting balance-Leahy Scale: Good     Standing balance support: Bilateral upper extremity supported, During functional activity Standing balance-Leahy Scale: Poor                             ADL either performed or assessed with clinical judgement   ADL Overall ADL's : Needs assistance/impaired     Grooming: Wash/dry hands;Wash/dry face;Min guard;Standing       Lower Body Bathing: Min guard;Sit to/from stand Lower Body Bathing Details (indicate cue type and reason): simulated     Lower Body Dressing: Min guard;Sit to/from stand   Toilet Transfer: Min guard;Supervision/safety;Ambulation;Rolling walker (2 wheels);Cueing for safety     Toileting - Clothing Manipulation Details (indicate cue type and reason): min guard A clothing mgt, max A posterior hygiene     Functional mobility during ADLs: Min guard;Supervision/safety;Rolling walker (2 wheels);Cueing for safety      Extremity/Trunk Assessment Upper Extremity Assessment Upper Extremity Assessment: Overall WFL for tasks assessed   Lower Extremity Assessment Lower Extremity Assessment: Defer to PT evaluation   Cervical / Trunk Assessment Cervical / Trunk Assessment: Normal    Vision Ability to See in Adequate Light: 0 Adequate     Perception     Praxis      Cognition Arousal/Alertness: Awake/alert Behavior During Therapy: WFL for tasks assessed/performed Overall Cognitive Status: Within Functional Limits for tasks assessed  Exercises      Shoulder Instructions       General Comments      Pertinent Vitals/ Pain       Pain Assessment Pain Assessment: No/denies pain Pain Score: 0-No pain Pain Intervention(s): Monitored during session, Repositioned  Home Living                                           Prior Functioning/Environment              Frequency  Min 1X/week        Progress Toward Goals  OT Goals(current goals can now be found in the care plan section)  Progress towards OT goals: Progressing toward goals     Plan Discharge plan remains appropriate    Co-evaluation                 AM-PAC OT "6 Clicks" Daily Activity     Outcome Measure   Help from another person eating meals?: None Help from another person taking care of personal grooming?: A Little Help from another person toileting, which includes using toliet, bedpan, or urinal?: A Little Help from another person bathing (including washing, rinsing, drying)?: A Little Help from another person to put on and taking off regular upper body clothing?: A Little Help from another person to put on and taking off regular lower body clothing?: A Little 6 Click Score: 19    End of Session Equipment Utilized During Treatment: Gait belt;Rolling walker (2 wheels)  OT Visit Diagnosis: Unsteadiness on feet (R26.81);Other abnormalities of gait and mobility (R26.89);Muscle weakness (generalized) (M62.81)   Activity Tolerance Patient tolerated treatment well   Patient Left in bed;with bed alarm set   Nurse Communication          Time: 1610-9604 OT Time Calculation (min): 17 min  Charges: OT General Charges $OT Visit: 1 Visit OT Treatments $Self Care/Home Management : 8-22 mins    Margaretmary Eddy [facility] 10/20/2022, 12:09 PM

## 2022-10-20 NOTE — Progress Notes (Signed)
     Daily Progress Note Intern Pager: [phone]  [name] Medical record number: [id number] Date of birth: [birth year] Age: 72 y.o. Gender: female  [facility] Care Provider: [facility] [place] [facility]: nephro, IR, psych  Code Status: FULL   Pt Overview and Major Events to Date:  10/18/22: Admitted to FMTS   Assessment and Plan: Robin [name] is a 72 y.o. female presenting with altered mental status possibly in setting of medication change. Back to baseline, hypotension and low temps resolved as well. Had unsuccessful thrombectomy of AVG, requiring TDC. Dialysis today.   * Transient altered mental status Improved, no evidence of infectious etiology. Psychiatry evaluated patient and agreed to continue holding Abilify and Trazodone. - PT/OT - Start Keflex 500 mg BID on dialysis days for UTI PPX for 3 days - Start nystatin BID for concern of vaginal candidiasis   Electrolyte abnormality Per nephro   Hyponatremia Stable   Hypotension Resolved   ESRD (end stage renal disease) (HCC) HD MWF. TDC, dialysis today. May go home after if successful.  -Appreciate nephrology recs -[facility]  Diabetes mellitus type 2, insulin dependent (HCC) Continue SSI for now, CBGs at goal. Add LAI 3 U if becomes uncontrolled.     FEN/GI: Renal PPx: Eliquis Dispo:Home with home health today. Barriers include completion of dialysis.   Subjective:  Doing well this AM, no complaints. Wants breakfast.   Objective: Temp:  [98.4 F (36.9 C)-98.6 F (37 C)] 98.4 F (36.9 C) (05/27 0441) Pulse Rate:  [76-90] 76 (05/27 0441) Resp:  [12-19] 19 (05/27 0441) BP: (109-153)/(63-71) 125/63 (05/27 0441) SpO2:  [98 %-100 %] 99 % (05/27 0441) General: Alert in no apparent distress Heart: Regular rate and rhythm with no murmurs appreciated Lungs: CTA bilaterally, no wheezing Abdomen: no abdominal pain Skin: Warm and dry Extremities: No lower extremity edema, bandage  over TDC--no strikethrough    Laboratory: Most recent CBC Lab Results  Component Value Date   WBC 5.9 10/20/2022   HGB 8.9 (L) 10/20/2022   HCT 27.2 (L) 10/20/2022   MCV 98.9 10/20/2022   PLT 188 10/20/2022   Most recent BMP    Latest Ref Rng & Units 10/20/2022    2:45 AM  BMP  Glucose 70 - 99 mg/dL 147   BUN 8 - 23 mg/dL 16   Creatinine 8.29 - 1.00 mg/dL 5.62   Sodium 130 - 865 mmol/L 131   Potassium 3.5 - 5.1 mmol/L 3.3   Chloride 98 - 111 mmol/L 96   CO2 22 - 32 mmol/L 26   Calcium 8.9 - 10.3 mg/dL 8.7      Alfredo Martinez, MD 10/20/2022, 9:50 AM  PGY-2, [facility] FPTS Intern pager: [phone], text pages welcome Secure chat group [facility] Teaching Service

## 2022-10-20 NOTE — Plan of Care (Signed)

## 2022-10-20 NOTE — Progress Notes (Addendum)
[facility] Progress Note   Subjective:  Seen in room, family at bedside.  Feels good today. No concerns this am. Dialysis today and will likely discharge after.   Objective Vitals:   10/19/22 1627 10/19/22 2010 10/20/22 0441 10/20/22 1134  BP: (!) 153/71 113/71 125/63 (!) 153/72  Pulse: 85 90 76   Resp: 12 14 19    Temp:  98.4 F (36.9 C) 98.4 F (36.9 C) 98.1 F (36.7 C)  TempSrc:  Oral Oral Oral  SpO2: 98% 100% 99%   Weight:      Height:         Additional Objective Labs: Basic Metabolic Panel: Recent Labs  Lab 10/18/22 0200 10/19/22 0249 10/20/22 0245  NA 130* 131* 131*  K 3.6 3.3* 3.3*  CL 94* 96* 96*  CO2 27 27 26   GLUCOSE 231* 129* 280*  BUN 22 9 16   CREATININE 6.72* 3.70* 5.54*  CALCIUM 8.8* 8.5* 8.7*  PHOS  --  2.1* 3.1    CBC: Recent Labs  Lab 10/17/22 0506 10/17/22 0526 10/18/22 0200 10/19/22 0249 10/20/22 0245  WBC 8.0  --  6.4 6.5 5.9  NEUTROABS 5.5  --   --   --   --   HGB 10.2*   < > 9.0* 9.0* 8.9*  HCT 31.9*   < > 27.8* 27.9* 27.2*  MCV 102.2*  --  99.6 100.0 98.9  PLT 271  --  204 194 188   < > = values in this interval not displayed.    Blood Culture    Component Value Date/Time   SDES BLOOD LEFT ARM 10/17/2022 0811   SPECREQUEST  10/17/2022 0811    BOTTLES DRAWN AEROBIC AND ANAEROBIC Blood Culture adequate volume   CULT  10/17/2022 0811    NO GROWTH 3 DAYS Performed at [facility], [address] [place], [place] [address]    REPTSTATUS PENDING 10/17/2022 6045     Physical Exam General: Alert, lying in bed, nad Heart: RRR No murmur, no rub Lungs: Clear bilaterally  Abdomen: soft non-tender Extremities: no LE edema  Dialysis Access: LUE AVG no bruit; new R IJ TDC inplace  Medications:   apixaban  2.5 mg Oral BID WC   atorvastatin  80 mg Oral QHS   cephALEXin  500 mg Oral Q M,W,F-HD   Chlorhexidine Gluconate Cloth  6 each Topical Daily   Chlorhexidine Gluconate Cloth  6 each Topical Q0600    clopidogrel  75 mg Oral Daily   darbepoetin (ARANESP) injection - DIALYSIS  100 mcg Subcutaneous Q Mon-1800   empagliflozin  10 mg Oral Daily   insulin aspart  0-5 Units Subcutaneous QHS   insulin aspart  0-6 Units Subcutaneous TID WC   liver oil-zinc oxide   Topical TID   melatonin  5 mg Oral QHS   multivitamin  1 tablet Oral QHS   nystatin cream  1 Application Topical BID   pantoprazole  40 mg Oral BID AC   umeclidinium bromide  1 puff Inhalation q morning    Dialysis Orders:  [facility] 3hrs20min, BFR 400, DFR Auto 1.5,  EDW 59kg, 2K/ 2Ca Heparin 5000 units with HD Mircera 100 mcg q2wks - last 5/13 Hectorol IV qHD - Last 5/22  Assessment/Plan: Altered Mental Status - Per primary. Suspected secondary to overmedication/hypoglycemia. Psych meds adjusted. MS back to baseline.   Hyponatremia - Improving. Continue to manage with UF on HD.  ESRD -  on HD usually  MWF. HD off schedule Sat. Back on schedule Monday. HD today  Access - AVG found to be clotted on admission. IR unable to declot (noted rupture in graft with hematoma). TDC placed. Will need outpatient vascular follow up. Seen at [facility] -will arrange follow up Hypertension/volume  - Blood pressure soft/stable, euvolemic on exam, CXR unremarkable.  Anemia of CKD - Hgb 11.2 >9.0 . Resume ESA on 5/27.  Secondary Hyperparathyroidism -  Ca ok. Phos low - hold Auryxia binder here.  Nutrition - Continue renal diet.  DM - Per primary  Tomasa Blase PA-C [facility] 10/20/2022,12:08 PM  Pt seen, examined and agree w A/P as above.  Vinson Moselle  MD 10/20/2022, 4:53 PM

## 2022-10-20 NOTE — Discharge Instructions (Addendum)
Dear Robin [name],   Thank you so much for allowing [place] to be part of your care!  You were admitted to [facility] for altered mental status and some vital sign instability. We were able to monitor you until you improved.   You also had a tunnel catheter placed. [facility] and [facility] so that they can facilitate appropriate interventions to allow for HD   You may complete the Keflex, needs two more doses of antibiotic with dialysis   You may also use the nystatin cream for concern of yeast infection, use twice a day for a total of 5 doses.   Please see your PCP as well. We will start you on 3 units of long acting insulin because you were controlled on the sliding scale in the hospital. If you get repeatedly high sugar checks, please call your PCP to discuss how to change your insulin regimen. Call pcp with any lows as well    POST-HOSPITAL & CARE INSTRUCTIONS Please let PCP/Specialists know of any changes that were made.  Please see medications section of this packet for any medication changes.   DOCTOR'S APPOINTMENT & FOLLOW UP CARE INSTRUCTIONS  -[facility]:: please call for appt  -PCP in 1 week  -Psychiatry:: please make appt   RETURN PRECAUTIONS: -Worsening mental status -Confusion worse than baseline  -Chest pain or shortness of breath -Swelling in the legs   Take care and be well!  Family Medicine Teaching Service  [place]  [name]  [address] [place], [place] [address] [phone]

## 2022-10-21 LAB — CBC
HCT: 29.3 % — ABNORMAL LOW (ref 36.0–46.0)
Hemoglobin: 9.5 g/dL — ABNORMAL LOW (ref 12.0–15.0)
MCH: 32 pg (ref 26.0–34.0)
MCHC: 32.4 g/dL (ref 30.0–36.0)
MCV: 98.7 fL (ref 80.0–100.0)
Platelets: 195 10*3/uL (ref 150–400)
RBC: 2.97 MIL/uL — ABNORMAL LOW (ref 3.87–5.11)
RDW: 17.6 % — ABNORMAL HIGH (ref 11.5–15.5)
WBC: 5.7 10*3/uL (ref 4.0–10.5)
nRBC: 0 % (ref 0.0–0.2)

## 2022-10-21 LAB — RENAL FUNCTION PANEL
Albumin: 2.5 g/dL — ABNORMAL LOW (ref 3.5–5.0)
Anion gap: 10 (ref 5–15)
BUN: 8 mg/dL (ref 8–23)
CO2: 27 mmol/L (ref 22–32)
Calcium: 8.6 mg/dL — ABNORMAL LOW (ref 8.9–10.3)
Chloride: 95 mmol/L — ABNORMAL LOW (ref 98–111)
Creatinine, Ser: 3.48 mg/dL — ABNORMAL HIGH (ref 0.44–1.00)
GFR, Estimated: 14 mL/min — ABNORMAL LOW (ref >60)
Glucose, Bld: 166 mg/dL — ABNORMAL HIGH (ref 70–99)
Phosphorus: 2.2 mg/dL — ABNORMAL LOW (ref 2.5–4.6)
Potassium: 3.3 mmol/L — ABNORMAL LOW (ref 3.5–5.1)
Sodium: 132 mmol/L — ABNORMAL LOW (ref 135–145)

## 2022-10-21 LAB — GLUCOSE, CAPILLARY: Glucose-Capillary: 156 mg/dL — ABNORMAL HIGH (ref 70–99)

## 2022-10-21 MED ORDER — CEPHALEXIN 500 MG PO CAPS: ORAL_CAPSULE | ORAL | 0 refills | Status: DC

## 2022-10-21 NOTE — Progress Notes (Signed)
     Daily Progress Note Intern Pager: [phone]  [name] Medical record number: [id number] Date of birth: 1950/07/13 Age: 72 y.o. Gender: female  [facility]: [facility]: nephro, IR, psych  Code Status: FULL   Pt Overview and Major Events to Date:  10/18/22: Admitted to FMTS   Assessment and Plan: Robin [name] is a 72 y.o. female presenting with altered mental status possibly in setting of medication change. Back to baseline, hypotension and low temps resolved as well.   Had unsuccessful thrombectomy of AVG, requiring TDC. Received HD on 5/27.   * Transient altered mental status Improved, no evidence of infectious etiology. Psychiatry evaluated patient and agreed to continue holding Abilify and Trazodone. - PT/OT - Continue Keflex 500 mg BID on dialysis days for UTI PPX for 3 days (last day 6/3) - Continue nystatin BID for concern of vaginal candidiasis (last day 5/28)  Electrolyte abnormality Per nephro   Hyponatremia Stable at 132.  Hypotension Resolved, VSS   ESRD (end stage renal disease) (HCC) HD MWF. TDC, dialysis yesterday. -Appreciate nephrology recs -[facility]  Diabetes mellitus type 2, insulin dependent (HCC) Continue SSI for now, CBGs at goal. Given 7 units short acting insulin yesterday. - Add LAI 3 U if becomes uncontrolled.        FEN/GI: Renal PPx: Eliquis Dispo: Clinical improvement  Subjective:  Patient seen this morning.  Patient denies any concerns and is ready to go home. She is fully alert and oriented.  Objective: Temp:  [97.8 F (36.6 C)-98.5 F (36.9 C)] 98.5 F (36.9 C) (05/28 0347) Pulse Rate:  [20-212] 82 (05/28 0347) Resp:  [0-22] 15 (05/28 0347) BP: (96-153)/(50-94) 107/73 (05/28 0347) SpO2:  [90 %-100 %] 100 % (05/28 0347) Weight:  [61.4 kg] 61.4 kg (05/27 1315) Physical Exam: General: Elderly, well-appearing female in bed, NAD Cardiovascular: RRR, no  murmur Respiratory: Normal effort, CTA anteriorly Abdomen: Soft, nontender, nondistended Extremities: No lower extremity swelling  Laboratory: Most recent CBC Lab Results  Component Value Date   WBC 5.7 10/21/2022   HGB 9.5 (L) 10/21/2022   HCT 29.3 (L) 10/21/2022   MCV 98.7 10/21/2022   PLT 195 10/21/2022   Most recent BMP    Latest Ref Rng & Units 10/21/2022    1:16 AM  BMP  Glucose 70 - 99 mg/dL 621   BUN 8 - 23 mg/dL 8   Creatinine 3.08 - 6.57 mg/dL 8.46   Sodium 962 - 952 mmol/L 132   Potassium 3.5 - 5.1 mmol/L 3.3   Chloride 98 - 111 mmol/L 95   CO2 22 - 32 mmol/L 27   Calcium 8.9 - 10.3 mg/dL 8.6     Lance Muss, MD 10/21/2022, 9:29 AM  PGY-1, [facility] FPTS Intern pager: [phone], text pages welcome Secure chat group [facility] Teaching Service

## 2022-10-21 NOTE — Progress Notes (Signed)
D/C order noted. Contacted [facility] to advise clinic of pt's d/c today and that pt should resume care tomorrow.   Jay Kempe Renal Navigator [phone] 

## 2022-10-21 NOTE — Plan of Care (Signed)
[facility] Patient Discharge Orders- [facility] CLINIC: [facility]  Patient's name: Robin [name] Admit/DC Dates: 10/17/2022 - 10/21/2022  Discharge Diagnoses: Altered mental status -  Patient was recently discharged from the [facility] from 5/15 - 5/22 for psychosis. She was discharged on Abilify 15 mg and trazodone 25 mg. On admission, patient initially was hypotensive with pressures in the 90s/80s along with hypothermia of 95.8 F. Blood cultures NTD. Follow-up on final results. Trazodone and Abilify were held per Psych. Concern for UTI - 3-day course PO Keflex ordered DM Type 2 - A1c 7.7 back in March. Jardiance added (cleared by [place]). On 3 units long acting insulin  Aranesp: Given: Yes    Date and amount of last dose: on 10/20/22   Last Hgb: 9.5 PRBC's Given: No  ESA dose for discharge: mircera 150 mcg IV q 2 weeks   Heparin change: N/A. Notify renal team for any issues with clotting  EDW Change: No  Bath Change: No  Access intervention/Change: Yes Details: L AVG clotted at admit. IR unable to de-clot: noted rupture in graft with hematoma. TDC placed.   Hectorol change: No  Discharge Labs: Calcium 8.6 Phosphorus 2.2 Albumin 2.5 K+ 3.3  IV Antibiotics: Yes Details: Completing 3-day course PO Keflex for concern for UTI.    On Coumadin?: No; On Apixaban   OTHER/APPTS/LAB ORDERS:  Please refer to [facility] outpatient to evaluate L AVG. L AVG clotted at admit and de-clot unsuccessful in IR here. Noted last intervention done by Dr. Chestine Spore on 08/22/21. Patient now has a [facility] Follow-up on inpatient blood cultures from 5/24. So far, NGTD but final result pending. Notify renal team if final results are positive.    D/C Meds to be reconciled by nurse after every discharge.  Completed By: Salome Holmes, NP-C 10/21/2022, 5:15 PM  [facility] Pager: [phone]  Reviewed by: MD:______ RN_______

## 2022-10-21 NOTE — TOC Initial Note (Signed)
Transition of Care [facility]) - Initial/Assessment Note    Patient Details  Name: Robin [name] MRN: [id number] Date of Birth: 1950/07/20  Transition of Care [facility]) CM/SW Contact:    Robin Lewandowsky, RN Phone Number: 10/21/2022, 11:25 AM  Clinical Narrative: Patient presented for altered mental status. Plan to return home today. Case Manager spoke with daughter Robin [name] and she is agreeable with the plan to return home. Patient has caregivers via [facility] [employer] that the [place] arranged. Robin [name] is agreeable to home health physical therapy via [facility]. HH Agency has to get authorization from the [place] before the patient can be seen. [facility] will call Robin [name] with No DME needs identified at this time. Daughter to provide transportation home today.                     Expected Discharge Plan: Home w Home Health Services Barriers to Discharge: No Barriers Identified   Patient Goals and CMS Choice Patient states their goals for this hospitalization and ongoing recovery are:: to return home.   Choice offered to / list presented to :  (daughter did not have a preference)      Expected Discharge Plan and Services In-house Referral: NA Discharge Planning Services: CM Consult Post Acute Care Choice: Home Health Living arrangements for the past 2 months: Single Family Home Expected Discharge Date: 10/21/22                 DME Agency: NA       HH Arranged: PT HH Agency: [facility] Date HH Agency Contacted: 10/21/22 Time HH Agency Contacted: 1123 Representative spoke with at [facility] Agency: Robin [name]  Prior Living Arrangements/Services Living arrangements for the past 2 months: Single Family Home Lives with:: Adult Children, Relatives Patient language and need for interpreter reviewed:: Yes Do you feel safe going back to the place where you live?: Yes      Need for Family Participation in Patient Care: Yes (Comment) Care giver support system in place?: Yes (comment) Current home  services: DME, Homehealth aide Criminal Activity/Legal Involvement Pertinent to Current Situation/Hospitalization: No - Comment as needed  Activities of Daily Living Home Assistive Devices/Equipment: Crutches, Electric scooter ADL Screening (condition at time of admission) Patient's cognitive ability adequate to safely complete daily activities?: No Is the patient deaf or have difficulty hearing?: No Does the patient have difficulty seeing, even when wearing glasses/contacts?: No Does the patient have difficulty concentrating, remembering, or making decisions?: Yes Patient able to express need for assistance with ADLs?: Yes Does the patient have difficulty dressing or bathing?: Yes Independently performs ADLs?: No Communication: Independent Dressing (OT): Independent Grooming: Independent Feeding: Independent Bathing: Independent with device (comment) Toileting: Independent with device (comment) In/Out Bed: Independent Walks in Home: Independent with device (comment) Does the patient have difficulty walking or climbing stairs?: Yes Weakness of Legs: Both Weakness of Arms/Hands: None  Permission Sought/Granted Permission sought to share information with : Family Supports, Case Production designer, theatre/television/film, Oceanographer granted to share information with : Yes, Verbal Permission Granted     Permission granted to share info w AGENCY: [facility]        Emotional Assessment Appearance:: Appears stated age Attitude/Demeanor/Rapport: Engaged Affect (typically observed): Appropriate Orientation: : Oriented to Self Alcohol / Substance Use: Not Applicable Psych Involvement: No (comment)  Admission diagnosis:  Hypotension [I95.9] Patient Active Problem List   Diagnosis Date Noted   Transient neurologic deficit 10/20/2022   Electrolyte abnormality 10/19/2022   Adjustment disorder  10/18/2022   Hypotension 10/17/2022   Hyponatremia 10/17/2022   Malnutrition of moderate degree  08/05/2022   NSTEMI (non-ST elevated myocardial infarction) (HCC) 07/29/2022   PAC (premature atrial contraction) 07/29/2022   Hyperlipidemia with target LDL less than 70 07/29/2022   Transient altered mental status 07/27/2020   COVID-19 01/10/2020   Hypoxia 01/10/2020   History of pulmonary embolism 10/01/2019   Prolonged QT interval 10/01/2019   Hypertensive urgency    Other headache syndrome    Orthostatic dizziness 04/15/2019   Bradycardia 04/15/2019   Syncope    ESRD (end stage renal disease) (HCC)    Chest pain at rest 09/16/2018   Diabetic Charct's arthropathy (HCC) 10/07/2016   GERD (gastroesophageal reflux disease) 09/09/2016   Depression 09/09/2016   Chronic diastolic (congestive) heart failure (HCC) 09/09/2016   Elevated troponin 09/09/2016   Hyperlipidemia associated with type 2 diabetes mellitus (HCC) 03/26/2015   Charcot ankle 03/16/2015   Anemia, chronic renal failure    NSVT (nonsustained ventricular tachycardia) (HCC)    Hypertension due to end stage renal disease caused by type 2 diabetes mellitus, on dialysis (HCC) 12/09/2014   Bipolar affective disorder (HCC) 12/09/2014   Diabetes mellitus type 2, insulin dependent (HCC) 11/17/2014   CAD (coronary artery disease), native coronary artery with 2 stents  11/17/2014   PCP:  [facility]:   [id number] [place], [place] - [address] [place] [address] Phone: [phone] Fax: [phone]  [name] [place], [place] - [address] [place] [address] Phone: [phone] Fax: [phone]     Social Determinants of Health (SDOH) Social History: SDOH Screenings   Food Insecurity: No Food Insecurity (10/17/2022)  Housing: Patient Declined (10/17/2022)  Transportation Needs: No Transportation Needs (10/17/2022)  Utilities: Not At Risk (10/17/2022)  Tobacco Use: High Risk (10/18/2022)   SDOH Interventions:      Readmission Risk Interventions     No data to display

## 2022-10-21 NOTE — Progress Notes (Signed)
[facility] Progress Note   Subjective:    Seen and examined patient at bedside. Tolerated yesterday's HD with net UF 1.5L. Denies any acute complaints. She is asking about going home. Next HD 5/29 if she is still here.  Objective Vitals:   10/20/22 2000 10/20/22 2016 10/21/22 0000 10/21/22 0347  BP: 111/62  137/82 107/73  Pulse: 87  90 82  Resp: 16  14 15   Temp: 97.8 F (36.6 C) 98.5 F (36.9 C)  98.5 F (36.9 C)  TempSrc: Oral Oral  Oral  SpO2: 97%  97% 100%  Weight:      Height:       Physical Exam General: Alert, lying in bed, nad Heart: RRR No murmur, no rub Lungs: Clear bilaterally  Abdomen: soft non-tender Extremities: no LE edema  Dialysis Access: LUE AVG no bruit; new R IJ TDC in place  [facility] Weights   10/17/22 0453 10/20/22 1315  Weight: 61.7 kg 61.4 kg    Intake/Output Summary (Last 24 hours) at 10/21/2022 0944 Last data filed at 10/20/2022 1712 Gross per 24 hour  Intake --  Output 1500 ml  Net -1500 ml    Additional Objective Labs: Basic Metabolic Panel: Recent Labs  Lab 10/19/22 0249 10/20/22 0245 10/21/22 0116  NA 131* 131* 132*  K 3.3* 3.3* 3.3*  CL 96* 96* 95*  CO2 27 26 27   GLUCOSE 129* 280* 166*  BUN 9 16 8   CREATININE 3.70* 5.54* 3.48*  CALCIUM 8.5* 8.7* 8.6*  PHOS 2.1* 3.1 2.2*   Liver Function Tests: Recent Labs  Lab 10/17/22 0506 10/18/22 0200 10/19/22 0249 10/20/22 0245 10/21/22 0116  AST 45* 22  --   --   --   ALT 16 13  --   --   --   ALKPHOS 141* 132*  --   --   --   BILITOT 1.6* 0.6  --   --   --   PROT 6.6 5.6*  --   --   --   ALBUMIN 3.1* 2.5* 2.5* 2.5* 2.5*   No results for input(s): "LIPASE", "AMYLASE" in the last 168 hours. CBC: Recent Labs  Lab 10/17/22 0506 10/17/22 0526 10/18/22 0200 10/19/22 0249 10/20/22 0245 10/21/22 0116  WBC 8.0  --  6.4 6.5 5.9 5.7  NEUTROABS 5.5  --   --   --   --   --   HGB 10.2*   < > 9.0* 9.0* 8.9* 9.5*  HCT 31.9*   < > 27.8* 27.9* 27.2* 29.3*  MCV  102.2*  --  99.6 100.0 98.9 98.7  PLT 271  --  204 194 188 195   < > = values in this interval not displayed.   Blood Culture    Component Value Date/Time   SDES BLOOD LEFT ARM 10/17/2022 0811   SPECREQUEST  10/17/2022 0811    BOTTLES DRAWN AEROBIC AND ANAEROBIC Blood Culture adequate volume   CULT  10/17/2022 0811    NO GROWTH 4 DAYS Performed at [facility], [address] [place], [place] [address]    REPTSTATUS PENDING 10/17/2022 [phone]    Cardiac Enzymes: No results for input(s): "CKTOTAL", "CKMB", "CKMBINDEX", "TROPONINI" in the last 168 hours. CBG: Recent Labs  Lab 10/19/22 2136 10/20/22 0739 10/20/22 1131 10/20/22 2115 10/21/22 0812  GLUCAP 171* 173* 268* 251* 156*   Iron Studies: No results for input(s): "IRON", "TIBC", "TRANSFERRIN", "FERRITIN" in the last 72 hours. Lab Results  Component Value Date  INR 1.4 (H) 10/17/2022   INR 1.3 (H) 07/25/2020   INR 1.4 (H) 01/08/2020   Studies/Results: No results found.  Medications:   apixaban  2.5 mg Oral BID WC   atorvastatin  80 mg Oral QHS   cephALEXin  500 mg Oral Q M,W,F-HD   Chlorhexidine Gluconate Cloth  6 each Topical Daily   Chlorhexidine Gluconate Cloth  6 each Topical Q0600   clopidogrel  75 mg Oral Daily   darbepoetin (ARANESP) injection - DIALYSIS  100 mcg Subcutaneous Q Mon-1800   empagliflozin  10 mg Oral Daily   insulin aspart  0-5 Units Subcutaneous QHS   insulin aspart  0-6 Units Subcutaneous TID WC   liver oil-zinc oxide   Topical TID   melatonin  5 mg Oral QHS   multivitamin  1 tablet Oral QHS   nystatin cream  1 Application Topical BID   pantoprazole  40 mg Oral BID AC   umeclidinium bromide  1 puff Inhalation q morning    Dialysis Orders: [facility] 3hrs35min, BFR 400, DFR Auto 1.5,  EDW 59kg, 2K/ 2Ca Heparin 5000 units with HD Mircera 100 mcg q2wks - last 5/13 Hectorol IV qHD - Last 5/22  Assessment/Plan: Altered Mental Status - Per primary.  Suspected secondary to overmedication/hypoglycemia. Psych meds adjusted. MS back to baseline.   Hyponatremia - Improving. Continue to manage with UF on HD.  ESRD -  on HD usually MWF. Now back on schedule. Next HD 5/29 if she is still here. Access - AVG found to be clotted on admission. IR unable to declot (noted rupture in graft with hematoma). TDC placed. Will need outpatient vascular follow up. Seen at [facility] -will arrange follow up.  Hypertension/volume  - Blood pressure soft/stable, euvolemic on exam, CXR unremarkable.  Anemia of CKD - Hgb 11.2 >9.0 . Resume ESA on 5/27.  Secondary Hyperparathyroidism -  Ca ok. Phos low - hold Auryxia binder here.  Nutrition - Continue renal diet.  DM - Per primary Dispo - reached out to Primary today on anticipated discharge date  Salome Holmes, NP [name] 10/21/2022,9:44 AM  LOS: 0 days

## 2022-10-21 NOTE — Progress Notes (Signed)
Physical Therapy Treatment Patient Details Name: Robin [name] MRN: [id number] DOB: Apr 28, 1951 Today's Date: 10/21/2022   History of Present Illness 72 Y/O F with brought in by her family for a change in her mental status from baseline.  PMHx of combined CHF, Bipolar disorder, CAD, DM2, ESRD, HTN, HLD, DVT, RLE transmetarsal ambutation and LLE 1st and 2nd toe amputations.She was recently discharged from the hospital for a mental health condition, and Trazadone was added to her medication regimen.    PT Comments    Pt tolerated treatment well today. Pt was able to ambulate in hallway with RW at supervision level. No change in DC/DME recs at this time. Pt anticipates DC home today. PT will continue to follow if still admitted.   Recommendations for follow up therapy are one component of a multi-disciplinary discharge planning process, led by the attending physician.  Recommendations may be updated based on patient status, additional functional criteria and insurance authorization.  Follow Up Recommendations       Assistance Recommended at Discharge Frequent or constant Supervision/Assistance  Patient can return home with the following A little help with walking and/or transfers;A little help with bathing/dressing/bathroom;Help with stairs or ramp for entrance;Assist for transportation;Assistance with cooking/housework;Direct supervision/assist for medications management   Equipment Recommendations  None recommended by PT    Recommendations for Other Services       Precautions / Restrictions Precautions Precautions: Fall Precaution Comments: Pt has a R transmet and L 1st and 2nd toe amputation Restrictions Weight Bearing Restrictions: No     Mobility  Bed Mobility               General bed mobility comments: Pt received seated EOB    Transfers Overall transfer level: Needs assistance Equipment used: Rolling walker (2 wheels) Transfers: Sit to/from Stand Sit to Stand:  Supervision                Ambulation/Gait Ambulation/Gait assistance: Supervision Gait Distance (Feet): 75 Feet Assistive device: Rolling walker (2 wheels) Gait Pattern/deviations: Step-through pattern, Decreased stride length Gait velocity: decr     General Gait Details: no LOB noted.   Stairs             Wheelchair Mobility    Modified Rankin (Stroke Patients Only)       Balance Overall balance assessment: Mild deficits observed, not formally tested                                          Cognition Arousal/Alertness: Awake/alert Behavior During Therapy: WFL for tasks assessed/performed Overall Cognitive Status: Within Functional Limits for tasks assessed                                          Exercises      General Comments General comments (skin integrity, edema, etc.): VSS on RA      Pertinent Vitals/Pain Pain Assessment Pain Assessment: No/denies pain    Home Living                          Prior Function            PT Goals (current goals can now be found in the care plan section) Progress towards PT goals: Progressing toward  goals    Frequency    Min 3X/week      PT Plan Current plan remains appropriate    Co-evaluation              AM-PAC PT "6 Clicks" Mobility   Outcome Measure  Help needed turning from your back to your side while in a flat bed without using bedrails?: None Help needed moving from lying on your back to sitting on the side of a flat bed without using bedrails?: None Help needed moving to and from a bed to a chair (including a wheelchair)?: A Little Help needed standing up from a chair using your arms (e.g., wheelchair or bedside chair)?: A Little Help needed to walk in hospital room?: A Little Help needed climbing 3-5 steps with a railing? : A Little 6 Click Score: 20    End of Session Equipment Utilized During Treatment: Gait belt Activity  Tolerance: Patient tolerated treatment well Patient left: in chair;with call bell/phone within reach Nurse Communication: Mobility status PT Visit Diagnosis: Other abnormalities of gait and mobility (R26.89);Muscle weakness (generalized) (M62.81);Difficulty in walking, not elsewhere classified (R26.2)     Time: 1610-9604 PT Time Calculation (min) (ACUTE ONLY): 13 min  Charges:  $Gait Training: 8-22 mins                     Robin [name], PT, DPT Acute Rehab Services [phone]    Robin [name] 10/21/2022, 12:29 PM

## 2022-10-22 ENCOUNTER — Telehealth: Payer: Self-pay | Admitting: Nephrology

## 2022-10-22 LAB — CULTURE, BLOOD (ROUTINE X 2)
Culture: NO GROWTH
Culture: NO GROWTH
Special Requests: ADEQUATE

## 2022-10-22 LAB — VITAMIN B1: Vitamin B1 (Thiamine): 107.8 nmol/L (ref 66.5–200.0)

## 2022-10-22 NOTE — Telephone Encounter (Signed)
Transition of Care Contact from Inpatient Facility  Date of Discharge: 10/21/22  Date of Contact: 10/22/22 Method of contact: phone - attempted  Attempted to contact patient to discuss transition of care from inpatient admission.  Patient did not answer the phone.  Unable to leave a message. Will attempt to call them again and if unable to reach will follow up at dialysis.  Virgina Norfolk, PA-C [employer]

## 2022-11-20 ENCOUNTER — Encounter (HOSPITAL_COMMUNITY): Payer: Self-pay

## 2022-11-20 ENCOUNTER — Emergency Department (HOSPITAL_COMMUNITY): Payer: No Typology Code available for payment source

## 2022-11-20 ENCOUNTER — Emergency Department (HOSPITAL_COMMUNITY)
Admission: EM | Admit: 2022-11-20 | Discharge: 2022-11-20 | Disposition: A | Discharge status: Home / Self Care | Payer: No Typology Code available for payment source | Attending: Emergency Medicine | Admitting: Emergency Medicine

## 2022-11-20 ENCOUNTER — Other Ambulatory Visit: Payer: Self-pay

## 2022-11-20 DIAGNOSIS — R519 Headache, unspecified: Secondary | ICD-10-CM | POA: Diagnosis not present

## 2022-11-20 DIAGNOSIS — R42 Dizziness and giddiness: Secondary | ICD-10-CM | POA: Diagnosis not present

## 2022-11-20 DIAGNOSIS — N186 End stage renal disease: Secondary | ICD-10-CM | POA: Diagnosis not present

## 2022-11-20 DIAGNOSIS — Z7984 Long term (current) use of oral hypoglycemic drugs: Secondary | ICD-10-CM | POA: Insufficient documentation

## 2022-11-20 DIAGNOSIS — D631 Anemia in chronic kidney disease: Secondary | ICD-10-CM | POA: Diagnosis not present

## 2022-11-20 DIAGNOSIS — I2510 Atherosclerotic heart disease of native coronary artery without angina pectoris: Secondary | ICD-10-CM | POA: Diagnosis not present

## 2022-11-20 DIAGNOSIS — R197 Diarrhea, unspecified: Secondary | ICD-10-CM | POA: Insufficient documentation

## 2022-11-20 DIAGNOSIS — E860 Dehydration: Secondary | ICD-10-CM | POA: Diagnosis not present

## 2022-11-20 DIAGNOSIS — Z992 Dependence on renal dialysis: Secondary | ICD-10-CM | POA: Diagnosis not present

## 2022-11-20 DIAGNOSIS — Z79899 Other long term (current) drug therapy: Secondary | ICD-10-CM | POA: Diagnosis not present

## 2022-11-20 DIAGNOSIS — R112 Nausea with vomiting, unspecified: Secondary | ICD-10-CM | POA: Insufficient documentation

## 2022-11-20 DIAGNOSIS — Z20822 Contact with and (suspected) exposure to covid-19: Secondary | ICD-10-CM | POA: Diagnosis not present

## 2022-11-20 DIAGNOSIS — I5042 Chronic combined systolic (congestive) and diastolic (congestive) heart failure: Secondary | ICD-10-CM | POA: Insufficient documentation

## 2022-11-20 DIAGNOSIS — Z741 Need for assistance with personal care: Secondary | ICD-10-CM | POA: Insufficient documentation

## 2022-11-20 DIAGNOSIS — M6281 Muscle weakness (generalized): Secondary | ICD-10-CM | POA: Insufficient documentation

## 2022-11-20 DIAGNOSIS — Z794 Long term (current) use of insulin: Secondary | ICD-10-CM | POA: Insufficient documentation

## 2022-11-20 DIAGNOSIS — E1122 Type 2 diabetes mellitus with diabetic chronic kidney disease: Secondary | ICD-10-CM | POA: Diagnosis not present

## 2022-11-20 DIAGNOSIS — R1084 Generalized abdominal pain: Secondary | ICD-10-CM | POA: Diagnosis not present

## 2022-11-20 DIAGNOSIS — I132 Hypertensive heart and chronic kidney disease with heart failure and with stage 5 chronic kidney disease, or end stage renal disease: Secondary | ICD-10-CM | POA: Diagnosis not present

## 2022-11-20 DIAGNOSIS — L732 Hidradenitis suppurativa: Secondary | ICD-10-CM | POA: Insufficient documentation

## 2022-11-20 DIAGNOSIS — M86171 Other acute osteomyelitis, right ankle and foot: Secondary | ICD-10-CM | POA: Insufficient documentation

## 2022-11-20 DIAGNOSIS — Z7901 Long term (current) use of anticoagulants: Secondary | ICD-10-CM | POA: Diagnosis not present

## 2022-11-20 LAB — CBC
HCT: 33.1 % — ABNORMAL LOW (ref 36.0–46.0)
Hemoglobin: 10.1 g/dL — ABNORMAL LOW (ref 12.0–15.0)
MCH: 31.2 pg (ref 26.0–34.0)
MCHC: 30.5 g/dL (ref 30.0–36.0)
MCV: 102.2 fL — ABNORMAL HIGH (ref 80.0–100.0)
Platelets: 199 10*3/uL (ref 150–400)
RBC: 3.24 MIL/uL — ABNORMAL LOW (ref 3.87–5.11)
RDW: 16.1 % — ABNORMAL HIGH (ref 11.5–15.5)
WBC: 6.5 10*3/uL (ref 4.0–10.5)
nRBC: 0 % (ref 0.0–0.2)

## 2022-11-20 LAB — RESP PANEL BY RT-PCR (RSV, FLU A&B, COVID)  RVPGX2
Influenza A by PCR: NEGATIVE
Influenza B by PCR: NEGATIVE
Resp Syncytial Virus by PCR: NEGATIVE
SARS Coronavirus 2 by RT PCR: NEGATIVE

## 2022-11-20 LAB — BASIC METABOLIC PANEL
Anion gap: 16 — ABNORMAL HIGH (ref 5–15)
BUN: 18 mg/dL (ref 8–23)
CO2: 28 mmol/L (ref 22–32)
Calcium: 9.5 mg/dL (ref 8.9–10.3)
Chloride: 94 mmol/L — ABNORMAL LOW (ref 98–111)
Creatinine, Ser: 5.56 mg/dL — ABNORMAL HIGH (ref 0.44–1.00)
GFR, Estimated: 8 mL/min — ABNORMAL LOW (ref >60)
Glucose, Bld: 201 mg/dL — ABNORMAL HIGH (ref 70–99)
Potassium: 4 mmol/L (ref 3.5–5.1)
Sodium: 138 mmol/L (ref 135–145)

## 2022-11-20 LAB — LACTIC ACID, PLASMA: Lactic Acid, Venous: 1.4 mmol/L (ref 0.5–1.9)

## 2022-11-20 LAB — C DIFFICILE QUICK SCREEN W PCR REFLEX
C Diff antigen: NEGATIVE
C Diff interpretation: NOT DETECTED
C Diff toxin: NEGATIVE

## 2022-11-20 MED ORDER — ONDANSETRON HCL 4 MG/2ML IJ SOLN: 4.0000 mg | Freq: Once | INTRAMUSCULAR | Status: DC

## 2022-11-20 MED ORDER — METOCLOPRAMIDE HCL 5 MG/ML IJ SOLN
10.0000 mg | Freq: Once | INTRAMUSCULAR | Status: AC
  Administered 2022-11-20: 10 mg via INTRAVENOUS
  Filled 2022-11-20: qty 2

## 2022-11-20 MED ORDER — DIPHENHYDRAMINE HCL 50 MG/ML IJ SOLN
12.5000 mg | Freq: Once | INTRAMUSCULAR | Status: AC
  Administered 2022-11-20: 12.5 mg via INTRAVENOUS
  Filled 2022-11-20: qty 1

## 2022-11-20 MED ORDER — ACETAMINOPHEN 500 MG PO TABS
1000.0000 mg | ORAL_TABLET | Freq: Once | ORAL | Status: AC
  Administered 2022-11-20: 1000 mg via ORAL
  Filled 2022-11-20: qty 2

## 2022-11-20 MED ORDER — LACTATED RINGERS IV BOLUS
1000.0000 mL | Freq: Once | INTRAVENOUS | Status: AC
  Administered 2022-11-20: 1000 mL via INTRAVENOUS

## 2022-11-20 NOTE — ED Triage Notes (Signed)
Pt on Eliquis reports fall preceded by dizziness 1 week ago, hitting her head on a hard floor. Now has had headache x 2 days. Taking tylenol without relief. Endorses nausea and emesis x 2 today.

## 2022-11-20 NOTE — Discharge Instructions (Addendum)
Thank you for coming to [facility] Emergency Department. You were seen for nausea/vomiting/diarrhea, headache, dizziness. We did an exam, labs, and imaging, and these showed no acute findings. Likely you have a viral syndrome causing your symptoms which made you dehydrated. Please take zofran at home every 6-8 hours as needed for nausea vomiting to ensure that you can stay well-hydrated at home.  You can take Imodium 4mg  once, then 2mg  after each loose stool with a max of 16mg /day for diarrhea. Do not take Imodium for more than 2-3 days. Please take tylenol for headache.  Please follow up with your primary care provider within 1 week. Please go to dialysis in the morning.  Do not hesitate to return to the ED or call 911 if you experience: -Worsening symptoms -Nausea/vomiting so severe you cannot eat/drink anything -Diarrhea worsening causing dehydration -Lightheadedness, passing out -Fevers/chills -Anything else that concerns you

## 2022-11-20 NOTE — ED Provider Notes (Signed)
[place] EMERGENCY DEPARTMENT AT [facility] Provider Note   CSN: [id number] Arrival date & time: 11/20/22  1403     History  Chief Complaint  Patient presents with   Headache    Robin [name] is a 72 y.o. female with HTN, T2DM, bipolar affective disorder, NSVT, Charcot ankle, ESRD on HD MWF, orthostatic dizziness, history of PE, history of NSTEMI, h/o RLE osteomyelitis who presents with mechanical fall after slipping on a t-shirt on the floor 1 week ago, hitting her head on a hard floor. No LOC or N/V right after incident. Now has had all-over 10/10 headache and dizziness x 2 days. on Eliquis. Taking tylenol without relief. Endorses nausea and emesis x 2 today as well as 2 days of nonbloody diarrhea. No f/c, visual changes, numbness/tingling, asymmetric weakness, CP, SOB, sick contacts, leg swelling, other pain associated w/ fall. Does endorse diffuse abd "cramping." No one else has headache.    Headache      Home Medications Prior to Admission medications   Medication Sig Start Date End Date Taking? Authorizing Provider  acetaminophen (TYLENOL) 325 MG tablet Take 325 mg by mouth 2 (two) times daily.    [provider]  albuterol (VENTOLIN HFA) 108 (90 Base) MCG/ACT inhaler Inhale 1-2 puffs into the lungs every 6 (six) hours as needed for wheezing or shortness of breath.    [provider]  apixaban (ELIQUIS) 5 MG TABS tablet Take 2.5 mg by mouth 2 (two) times daily with a meal.    [provider]  atorvastatin (LIPITOR) 80 MG tablet Take 1 tablet (80 mg total) by mouth at bedtime. 08/05/22   Rana Snare, DO  CALCIUM PO Take 1 tablet by mouth daily.    [provider]  cephALEXin (KEFLEX) 500 MG capsule Take 1 tablet every evening. 10/21/22   Lincoln Brigham, MD  clopidogrel (PLAVIX) 75 MG tablet Take 1 tablet (75 mg total) by mouth daily. 08/06/22   Rana Snare, DO  diclofenac Sodium (VOLTAREN) 1 % GEL Apply 4 g topically 4 (four)  times daily as needed (for back pain). 08/05/22   Rana Snare, DO  empagliflozin (JARDIANCE) 10 MG TABS tablet Take 1 tablet (10 mg total) by mouth daily. 10/21/22   Alfredo Martinez, MD  ferric citrate (AURYXIA) 1 GM 210 MG(Fe) tablet Take 420 mg by mouth 2 (two) times daily with a meal.    [provider]  insulin aspart (NOVOLOG) 100 UNIT/ML injection Inject 6 Units into the skin 3 (three) times daily as needed for high blood sugar (CBG >300).    [provider]  insulin detemir (LEVEMIR FLEXTOUCH) 100 UNIT/ML FlexPen Inject 3 Units into the skin daily. Can increase after discussing with physician 10/20/22   Alfredo Martinez, MD  isosorbide mononitrate (IMDUR) 30 MG 24 hr tablet Take 1 tablet (30 mg total) by mouth daily with breakfast. 08/06/22   Rana Snare, DO  Melatonin 10 MG TABS Take 10 mg by mouth at bedtime.    [provider]  metoprolol succinate (TOPROL-XL) 25 MG 24 hr tablet Take 1 tablet (25 mg total) by mouth daily. 08/06/22   Rana Snare, DO  multivitamin (RENA-VIT) TABS tablet Take 1 tablet by mouth at bedtime.  12/11/17   [provider]  nitroGLYCERIN (NITROSTAT) 0.4 MG SL tablet Place 0.4 mg under the tongue every 5 (five) minutes as needed for chest pain.    [provider]  pantoprazole (PROTONIX) 40 MG tablet Take 40 mg by  mouth daily.    [provider]  Tiotropium Bromide Monohydrate (SPIRIVA RESPIMAT) 1.25 MCG/ACT AERS Inhale 2 puffs into the lungs every morning.    [provider]      Allergies    Patient has no known allergies.    Review of Systems   Review of Systems  Neurological:  Positive for headaches.   A 10 point review of systems was performed and is negative unless otherwise reported in HPI.  Physical Exam Updated Vital Signs BP (!) 131/114   Pulse 89   Temp 97.9 F (36.6 C) (Oral)   Resp (!) 9   Ht 5\' 4"  (1.626 m)   Wt 61.2 kg   SpO2 100%   BMI 23.17 kg/m  Physical Exam General:  Normal appearing elderly female, lying in bed.  HEENT: PERRLA, EOMI, Sclera anicteric, MMM, trachea midline. No TTP over temples. Cardiology: RRR, no murmurs/rubs/gallops. BL radial and DP pulses equal bilaterally.  Resp: Normal respiratory rate and effort. CTAB, no wheezes, rhonchi, crackles.  Abd: Soft, non-tender, non-distended. No rebound tenderness or guarding.  GU: Deferred. MSK: No peripheral edema or signs of trauma. Extremities without deformity or TTP. No cyanosis or clubbing. Skin: warm, dry. No rashes or lesions. Back: No C-spine TTP Neuro: A&Ox4, CNs II-XII grossly intact. MAEs. Sensation grossly intact.  Psych: Normal mood and affect.   ED Results / Procedures / Treatments   Labs (all labs ordered are listed, but only abnormal results are displayed) Labs Reviewed  BASIC METABOLIC PANEL - Abnormal; Notable for the following components:      Result Value   Chloride 94 (*)    Glucose, Bld 201 (*)    Creatinine, Ser 5.56 (*)    GFR, Estimated 8 (*)    Anion gap 16 (*)    All other components within normal limits  CBC - Abnormal; Notable for the following components:   RBC 3.24 (*)    Hemoglobin 10.1 (*)    HCT 33.1 (*)    MCV 102.2 (*)    RDW 16.1 (*)    All other components within normal limits  RESP PANEL BY RT-PCR (RSV, FLU A&B, COVID)  RVPGX2  C DIFFICILE QUICK SCREEN W PCR REFLEX    GASTROINTESTINAL PANEL BY PCR, STOOL (REPLACES STOOL CULTURE)  LACTIC ACID, PLASMA  LIPASE, BLOOD    EKG EKG Interpretation Date/Time:  Thursday November 20 2022 14:00:47 EDT Ventricular Rate:  81 PR Interval:  156 QRS Duration:  84 QT Interval:  462 QTC Calculation: 536 R Axis:   -61  Text Interpretation: Sinus rhythm with marked sinus arrhythmia with Premature ventricular complexes Left axis deviation Similar to priors Confirmed by Vivi Barrack [phone]) on 11/20/2022 6:03:17 PM  Radiology CT HEAD WO CONTRAST  Result Date: 11/20/2022 CLINICAL DATA:  Head trauma, minor (Age  >= 65y) EXAM: CT HEAD WITHOUT CONTRAST TECHNIQUE: Contiguous axial images were obtained from the base of the skull through the vertex without intravenous contrast. RADIATION DOSE REDUCTION: This exam was performed according to the departmental dose-optimization program which includes automated exposure control, adjustment of the mA and/or kV according to patient size and/or use of iterative reconstruction technique. COMPARISON:  CT head May 24, 24. FINDINGS: Brain: No evidence of acute infarction, hemorrhage, hydrocephalus, extra-axial collection or mass lesion/mass effect. Vascular: No hyperdense vessel or unexpected calcification. Skull: No acute fracture. Sinuses/Orbits: No acute finding. Other: None. IMPRESSION: No evidence of acute intracranial abnormality. Electronically Signed   By: Juluis Mire.D.  On: 11/20/2022 15:25    Procedures Procedures    Medications Ordered in ED Medications  lactated ringers bolus 1,000 mL (0 mLs Intravenous Stopped 11/20/22 2344)  acetaminophen (TYLENOL) tablet 1,000 mg (1,000 mg Oral Given 11/20/22 2055)  metoCLOPramide (REGLAN) injection 10 mg (10 mg Intravenous Given 11/20/22 2054)  diphenhydrAMINE (BENADRYL) injection 12.5 mg (12.5 mg Intravenous Given 11/20/22 2054)    ED Course/ Medical Decision Making/ A&P                          Medical Decision Making Amount and/or Complexity of Data Reviewed Labs: ordered. Decision-making details documented in ED Course. Radiology: ordered.  Risk OTC drugs. Prescription drug management.    This patient presents to the ED for concern of previous fall now w/ HA/N/V/D, this involves an extensive number of treatment options, and is a complaint that carries with it a high risk of complications and morbidity.  I considered the following differential and admission for this acute, potentially life threatening condition. Patient is overall very well-appearing, HDS, non-toxic, afebrile.   MDM:    Consider  ongoing/worsening ICH after fall last week w/ head trauma and eliquis. CTH reassuring w/ no ICH. Consider concussion symptoms w/ dizziness, N/V, and headache. Most likely 2/2 tension headache, migraine, or headache of non-emergent etiology. No focal neurological symptoms. Neuro exam is benign. Consider viral syndrome or viral gastroenteritis w/ headache, N/V/D. Given zofran. Consider atypical ACS w/ EKG showing no ischemic signs, similar to prior w/ sinus arrhythmia. No abd pain or RUQ TTP to raise c/f biliary disease. W/ diarrhea/n/v consider pancreatitis, but lipase neg. Also considered UTI - per chart review of recent admission from 10/17/22-10/21/22 for transient change in mentation/hypotension, patient had UA w/ signs of UTI but lots of squams, no ucx was drawn, patient was given 3 days of keflex. She has no urinary symptoms here or f/c.  Had also been admitted from 10/08/22-10/15/22 for psychosis. She is A&Ox4 currently.   Clinical Course as of 11/21/22 0034  Thu Nov 20, 2022  1920 Creatinine(!): 5.56 ESRD on HD [HN]  1920 Basic metabolic panel(!) No electrolyte derangements [HN]  1921 Anion gap(!): 16 Elevated AG. CO2 normal, glucose 201, lower c/f DKA. Will get lactate. Also consider dehydration. [HN]  1921 WBC: 6.5 No leukocytosis  [HN]  2048 Patient with very watery diarrhea here in ED x 3 large volume episodes, and patient was incontinent of stool as well. Will order stool studies d/t severity and volume of diarrhea. Also giving fluids with HA cocktail. [HN]  2103 Lactic Acid, Venous: 1.4 wnl [HN]  2203 Resp panel by RT-PCR (RSV, Flu A&B, Covid) Anterior Nasal Swab Neg [HN]  2234 C Difficile Quick Screen w PCR reflex Neg [HN]  2324 Patient reevaluated.  She has not had any further episodes of diarrhea in the first 3 she had while here in the ED.  After a liter of fluid her dizziness has much improved and a headache cocktail resolved her headache.  She does not feel nauseated anymore and  actually feels hungry, and her abdominal cramping has also resolved.  I suspect that the patient's symptoms are likely due to a viral gastroenteritis picture and dehydration.  Daughter at bedside states that patient already has a Zofran prescription at home and I encourage patient to take it when she feels nauseated so that she is able to eat and drink.  Her GI stool panel is pending but her other workup is  reassuring. No leukocytosis or fever to indicate acute bacterial cause of diarrhea. For headache, CT head NAICP, and I considered admission for this patient but I do not believe she requires admission and can follow-up as an outpatient.  I encourage patient and daughter to follow-up with her dialysis as of originally scheduled in the morning and to follow-up with her PCP within 1 to 2 weeks.  Patient is given extensive discharge instructions and return precautions.  Patient and her daughter both report understanding.  All questions answered to their satisfaction. [HN]    Clinical Course User Index [HN] Loetta Rough, MD    Labs: I Ordered, and personally interpreted labs.  The pertinent results include:  those listed above  Imaging Studies ordered: CTH ordered from triage I independently visualized and interpreted imaging. I agree with the radiologist interpretation  Additional history obtained from chart review.    Reevaluation: After the interventions noted above, I reevaluated the patient and found that they have :resolved  Social Determinants of Health: Patient lives independently   Disposition:  DC  Co morbidities that complicate the patient evaluation  Past Medical History:  Diagnosis Date   Acute delirium 11/18/2014   Acute encephalopathy    Acute on chronic respiratory failure with hypoxia (HCC) 09/09/2016   Acute respiratory failure with hypoxia (HCC) 11/29/2015   Anemia in chronic kidney disease 12/09/2014   Anxiety    Arthritis    Benign hypertension    Bipolar  affective disorder (HCC) 12/09/2014   CAD (coronary artery disease), native coronary artery with 2 stents  11/17/2014   Charcot foot due to diabetes mellitus (HCC)    Chronic combined systolic and diastolic CHF (congestive heart failure) (HCC) 11/18/2014   Closed left ankle fracture 11/17/2014   Confusion 01/21/2015   Depression    Diabetes mellitus without complication (HCC)    End stage renal disease on dialysis (HCC)    Fracture dislocation of ankle 11/17/2014   GERD (gastroesophageal reflux disease)    Heart murmur    History of blood transfusion    History of bronchitis    History of pneumonia    Hyperlipidemia 03/26/2015   Hypertension associated with diabetes (HCC) 11/18/2014   Hypertensive heart/renal disease with failure (HCC) 12/09/2014   Hypokalemia 11/17/2014   Multiple falls 01/21/2015   Obesity 11/17/2014   Onychomycosis 10/07/2016   Right leg DVT (HCC) 12/05/2015   SIRS (systemic inflammatory response syndrome) (HCC) 08/08/2016   Sleep apnea    Unstable angina (HCC) 12/26/2017   Ventricular tachycardia (HCC)      Medicines Meds ordered this encounter  Medications   DISCONTD: ondansetron (ZOFRAN) injection 4 mg   lactated ringers bolus 1,000 mL   acetaminophen (TYLENOL) tablet 1,000 mg   metoCLOPramide (REGLAN) injection 10 mg   diphenhydrAMINE (BENADRYL) injection 12.5 mg    I have reviewed the patients home medicines and have made adjustments as needed  Problem List / ED Course: Problem List Items Addressed This Visit   None Visit Diagnoses     Nausea vomiting and diarrhea    -  Primary   Dehydration       Acute nonintractable headache, unspecified headache type       Relevant Medications   acetaminophen (TYLENOL) tablet 1,000 mg (Completed)                   This note was created using dictation software, which may contain spelling or grammatical errors.    Loetta Rough, MD  11/21/22 0035  

## 2022-11-21 LAB — GASTROINTESTINAL PANEL BY PCR, STOOL (REPLACES STOOL CULTURE)

## 2022-12-12 ENCOUNTER — Other Ambulatory Visit: Payer: Self-pay

## 2022-12-12 ENCOUNTER — Emergency Department (HOSPITAL_COMMUNITY): Payer: No Typology Code available for payment source

## 2022-12-12 ENCOUNTER — Emergency Department (HOSPITAL_COMMUNITY)
Admission: EM | Admit: 2022-12-12 | Discharge: 2022-12-12 | Disposition: A | Discharge status: Home / Self Care | Payer: No Typology Code available for payment source | Attending: Emergency Medicine | Admitting: Emergency Medicine

## 2022-12-12 DIAGNOSIS — R079 Chest pain, unspecified: Secondary | ICD-10-CM | POA: Insufficient documentation

## 2022-12-12 DIAGNOSIS — Z992 Dependence on renal dialysis: Secondary | ICD-10-CM | POA: Insufficient documentation

## 2022-12-12 DIAGNOSIS — Z794 Long term (current) use of insulin: Secondary | ICD-10-CM | POA: Diagnosis not present

## 2022-12-12 DIAGNOSIS — N185 Chronic kidney disease, stage 5: Secondary | ICD-10-CM | POA: Diagnosis not present

## 2022-12-12 DIAGNOSIS — Z7902 Long term (current) use of antithrombotics/antiplatelets: Secondary | ICD-10-CM | POA: Insufficient documentation

## 2022-12-12 DIAGNOSIS — N186 End stage renal disease: Secondary | ICD-10-CM

## 2022-12-12 DIAGNOSIS — Z7901 Long term (current) use of anticoagulants: Secondary | ICD-10-CM | POA: Diagnosis not present

## 2022-12-12 LAB — I-STAT CHEM 8, ED
BUN: 13 mg/dL (ref 8–23)
Calcium, Ion: 0.87 mmol/L — CL (ref 1.15–1.40)
Chloride: 102 mmol/L (ref 98–111)
Creatinine, Ser: 4.5 mg/dL — ABNORMAL HIGH (ref 0.44–1.00)
Glucose, Bld: 86 mg/dL (ref 70–99)
HCT: 38 % (ref 36.0–46.0)
Hemoglobin: 12.9 g/dL (ref 12.0–15.0)
Potassium: 3.6 mmol/L (ref 3.5–5.1)
Sodium: 135 mmol/L (ref 135–145)
TCO2: 27 mmol/L (ref 22–32)

## 2022-12-12 LAB — BASIC METABOLIC PANEL
Anion gap: 16 — ABNORMAL HIGH (ref 5–15)
BUN: 11 mg/dL (ref 8–23)
CO2: 26 mmol/L (ref 22–32)
Calcium: 9.3 mg/dL (ref 8.9–10.3)
Chloride: 92 mmol/L — ABNORMAL LOW (ref 98–111)
Creatinine, Ser: 3.75 mg/dL — ABNORMAL HIGH (ref 0.44–1.00)
GFR, Estimated: 12 mL/min — ABNORMAL LOW (ref >60)
Glucose, Bld: 128 mg/dL — ABNORMAL HIGH (ref 70–99)
Potassium: 3.6 mmol/L (ref 3.5–5.1)
Sodium: 134 mmol/L — ABNORMAL LOW (ref 135–145)

## 2022-12-12 LAB — CBC
HCT: 43.2 % (ref 36.0–46.0)
Hemoglobin: 13.5 g/dL (ref 12.0–15.0)
MCH: 31 pg (ref 26.0–34.0)
MCHC: 31.3 g/dL (ref 30.0–36.0)
MCV: 99.3 fL (ref 80.0–100.0)
Platelets: 190 10*3/uL (ref 150–400)
RBC: 4.35 MIL/uL (ref 3.87–5.11)
RDW: 16 % — ABNORMAL HIGH (ref 11.5–15.5)
WBC: 4.8 10*3/uL (ref 4.0–10.5)
nRBC: 0 % (ref 0.0–0.2)

## 2022-12-12 LAB — TROPONIN I (HIGH SENSITIVITY)
Troponin I (High Sensitivity): 54 ng/L — ABNORMAL HIGH (ref <18)
Troponin I (High Sensitivity): 40 ng/L — ABNORMAL HIGH (ref <18)

## 2022-12-12 NOTE — ED Provider Notes (Signed)
[place] EMERGENCY DEPARTMENT AT [facility] Provider Note   CSN: [id number] Arrival date & time: 12/12/22  3474     History  Chief Complaint  Patient presents with   Chest Pain    Robin [name] is a 72 y.o. female.  HPI 72 yo female presents via ems from dialysis  She began having chest pain today like prior episodes.  EMS treated wutg asa and nitroglycerin with some relief.  Patient still endorses some chest discomfort but is unable to elaborate.  She also co mild dyspnea.       Home Medications Prior to Admission medications   Medication Sig Start Date End Date Taking? Authorizing Provider  acetaminophen (TYLENOL) 325 MG tablet Take 325 mg by mouth 2 (two) times daily.    [provider]  albuterol (VENTOLIN HFA) 108 (90 Base) MCG/ACT inhaler Inhale 1-2 puffs into the lungs every 6 (six) hours as needed for wheezing or shortness of breath.    [provider]  apixaban (ELIQUIS) 5 MG TABS tablet Take 2.5 mg by mouth 2 (two) times daily with a meal.    [provider]  atorvastatin (LIPITOR) 80 MG tablet Take 1 tablet (80 mg total) by mouth at bedtime. 08/05/22   Rana Snare, DO  CALCIUM PO Take 1 tablet by mouth daily.    [provider]  cephALEXin (KEFLEX) 500 MG capsule Take 1 tablet every evening. 10/21/22   Lincoln Brigham, MD  clopidogrel (PLAVIX) 75 MG tablet Take 1 tablet (75 mg total) by mouth daily. 08/06/22   Rana Snare, DO  diclofenac Sodium (VOLTAREN) 1 % GEL Apply 4 g topically 4 (four) times daily as needed (for back pain). 08/05/22   Rana Snare, DO  empagliflozin (JARDIANCE) 10 MG TABS tablet Take 1 tablet (10 mg total) by mouth daily. 10/21/22   Alfredo Martinez, MD  ferric citrate (AURYXIA) 1 GM 210 MG(Fe) tablet Take 420 mg by mouth 2 (two) times daily with a meal.    [provider]  insulin aspart (NOVOLOG) 100 UNIT/ML injection Inject 6 Units into the skin 3 (three) times daily as needed for high blood  sugar (CBG >300).    [provider]  insulin detemir (LEVEMIR FLEXTOUCH) 100 UNIT/ML FlexPen Inject 3 Units into the skin daily. Can increase after discussing with physician 10/20/22   Alfredo Martinez, MD  isosorbide mononitrate (IMDUR) 30 MG 24 hr tablet Take 1 tablet (30 mg total) by mouth daily with breakfast. 08/06/22   Rana Snare, DO  Melatonin 10 MG TABS Take 10 mg by mouth at bedtime.    [provider]  metoprolol succinate (TOPROL-XL) 25 MG 24 hr tablet Take 1 tablet (25 mg total) by mouth daily. 08/06/22   Rana Snare, DO  multivitamin (RENA-VIT) TABS tablet Take 1 tablet by mouth at bedtime.  12/11/17   [provider]  nitroGLYCERIN (NITROSTAT) 0.4 MG SL tablet Place 0.4 mg under the tongue every 5 (five) minutes as needed for chest pain.    [provider]  pantoprazole (PROTONIX) 40 MG tablet Take 40 mg by mouth daily.    [provider]  Tiotropium Bromide Monohydrate (SPIRIVA RESPIMAT) 1.25 MCG/ACT AERS Inhale 2 puffs into the lungs every morning.    [provider]      Allergies    Patient has no known allergies.    Review of Systems   Review of Systems  Physical Exam Updated Vital Signs BP (!) 206/108   Pulse  70   Temp 98 F (36.7 C)   Resp 18   SpO2 98%  Physical Exam Vitals and nursing note reviewed.   WDWN femal nad Heent- normal Neck supple Heart- rrr Abdomen- soft nttp Extremities- warm and dry A and o x 3              ED Results / Procedures / Treatments   Labs (all labs ordered are listed, but only abnormal results are displayed) Labs Reviewed  CBC - Abnormal; Notable for the following components:      Result Value   RDW 16.0 (*)    All other components within normal limits  BASIC METABOLIC PANEL - Abnormal; Notable for the following components:   Sodium 134 (*)    Chloride 92 (*)    Glucose, Bld 128 (*)    Creatinine, Ser 3.75 (*)    GFR, Estimated 12 (*)    Anion gap 16  (*)    All other components within normal limits  I-STAT CHEM 8, ED - Abnormal; Notable for the following components:   Creatinine, Ser 4.50 (*)    Calcium, Ion 0.87 (*)    All other components within normal limits  TROPONIN I (HIGH SENSITIVITY) - Abnormal; Notable for the following components:   Troponin I (High Sensitivity) 54 (*)    All other components within normal limits  TROPONIN I (HIGH SENSITIVITY) - Abnormal; Notable for the following components:   Troponin I (High Sensitivity) 40 (*)    All other components within normal limits    EKG None ED ECG REPORT   Date: 12/12/2022  Rate: 68  Rhythm: normal sinus rhythm  QRS Axis: normal  Intervals: normal  ST/T Wave abnormalities: nonspecific ST changes  Conduction Disutrbances:none  Narrative Interpretation:   Old EKG Reviewed: unchanged from 20 November 2022  I have personally reviewed the EKG tracing and agree with the computerized printout as noted.  Radiology DG Chest Port 1 View  Result Date: 12/12/2022 CLINICAL DATA:  Chest pain EXAM: PORTABLE CHEST 1 VIEW COMPARISON:  Chest x-Dorleen Kissel dated Oct 17, 2022 FINDINGS: The heart size and mediastinal contours are within normal limits. Right central venous line with tip in the right atrium. Basilar atelectasis. New nodular opacity of the left upper lobe. Lungs are otherwise clear. The visualized skeletal structures are unremarkable. IMPRESSION: New nodular opacity of the left upper lobe. Recommend further evaluation with chest CT. Electronically Signed   By: Allegra Lai M.D.   On: 12/12/2022 09:34    Procedures Procedures    Medications Ordered in ED Medications - No data to display  ED Course/ Medical Decision Making/ A&P Clinical Course as of 12/12/22 1430  Fri Dec 12, 2022  0867 Cxr reviewed and interpreted nad- radiologist notes new nodularity and advises ct chest [DR]  1004 Cbc reviewed and interpreted and wnl [DR]  1255 Istat reviewed and c.w. renal failure  [DR]     Clinical Course User Index [DR] Margarita Grizzle, MD                             Medical Decision Making Amount and/or Complexity of Data Reviewed Labs: ordered. Radiology: ordered.   72 yo female on dialysis presents with chest pain while ondialysis. Pain is aching Improved with nitroglycerin No acute EKG changes Patient stable here Awaiting trop- lab delays due to [employer] failure Patient remains stable Troponin 54 now 40 Patient with chronic elevate trop Doubt  acute coronary syndrome, dissection, pe, or other lung etiology No indication for emergent dialysis        Final Clinical Impression(s) / ED Diagnoses Final diagnoses:  Chest pain, unspecified type  Stage 5 chronic kidney disease on chronic dialysis [name])    Rx / DC Orders ED Discharge Orders     None         Margarita Grizzle, MD 12/12/22 1430

## 2022-12-12 NOTE — ED Provider Notes (Incomplete)
[facility]ERG[facility] DEPARTMENT AT [facility] Provider Note   CSN: [id number] Arrival date & time: 12/12/22  8416     History {Add pertinent medical, surgical, social history, OB history to HPI:1} Chief Complaint  Patient presents with  . Chest Pain    Robin [name] is a 72 y.o. female.  HPI 72 yo female presents via ems from dialysis  She began having chest pain today like prior episodes.  EMS treated wutg asa and nitroglycerin with some relief.  Patient still endorses some chest discomfort but is unable to elaborate.  She also co mild dyspnea.       Home Medications Prior to Admission medications   Medication Sig Start Date End Date Taking? Authorizing Provider  acetaminophen (TYLENOL) 325 MG tablet Take 325 mg by mouth 2 (two) times daily.    [provider]  albuterol (VENTOLIN HFA) 108 (90 Base) MCG/ACT inhaler Inhale 1-2 puffs into the lungs every 6 (six) hours as needed for wheezing or shortness of breath.    [provider]  apixaban (ELIQUIS) 5 MG TABS tablet Take 2.5 mg by mouth 2 (two) times daily with a meal.    [provider]  atorvastatin (LIPITOR) 80 MG tablet Take 1 tablet (80 mg total) by mouth at bedtime. 08/05/22   Rana Snare, DO  CALCIUM PO Take 1 tablet by mouth daily.    [provider]  cephALEXin (KEFLEX) 500 MG capsule Take 1 tablet every evening. 10/21/22   Lincoln Brigham, MD  clopidogrel (PLAVIX) 75 MG tablet Take 1 tablet (75 mg total) by mouth daily. 08/06/22   Rana Snare, DO  diclofenac Sodium (VOLTAREN) 1 % GEL Apply 4 g topically 4 (four) times daily as needed (for back pain). 08/05/22   Rana Snare, DO  empagliflozin (JARDIANCE) 10 MG TABS tablet Take 1 tablet (10 mg total) by mouth daily. 10/21/22   Alfredo Martinez, MD  ferric citrate (AURYXIA) 1 GM 210 MG(Fe) tablet Take 420 mg by mouth 2 (two) times daily with a meal.    [provider]  insulin aspart (NOVOLOG) 100 UNIT/ML injection  Inject 6 Units into the skin 3 (three) times daily as needed for high blood sugar (CBG >300).    [provider]  insulin detemir (LEVEMIR FLEXTOUCH) 100 UNIT/ML FlexPen Inject 3 Units into the skin daily. Can increase after discussing with physician 10/20/22   Alfredo Martinez, MD  isosorbide mononitrate (IMDUR) 30 MG 24 hr tablet Take 1 tablet (30 mg total) by mouth daily with breakfast. 08/06/22   Rana Snare, DO  Melatonin 10 MG TABS Take 10 mg by mouth at bedtime.    [provider]  metoprolol succinate (TOPROL-XL) 25 MG 24 hr tablet Take 1 tablet (25 mg total) by mouth daily. 08/06/22   Rana Snare, DO  multivitamin (RENA-VIT) TABS tablet Take 1 tablet by mouth at bedtime.  12/11/17   [provider]  nitroGLYCERIN (NITROSTAT) 0.4 MG SL tablet Place 0.4 mg under the tongue every 5 (five) minutes as needed for chest pain.    [provider]  pantoprazole (PROTONIX) 40 MG tablet Take 40 mg by mouth daily.    [provider]  Tiotropium Bromide Monohydrate (SPIRIVA RESPIMAT) 1.25 MCG/ACT AERS Inhale 2 puffs into the lungs every morning.    [provider]      Allergies    Patient has no known allergies.    Review of Systems   Review of Systems  Physical Exam  Updated Vital Signs There were no vitals taken for this visit. Physical Exam Vitals and nursing note reviewed.     ED Results / Procedures / Treatments   Labs (all labs ordered are listed, but only abnormal results are displayed) Labs Reviewed  CBC  BASIC METABOLIC PANEL  TROPONIN I (HIGH SENSITIVITY)    EKG None  Radiology No results found.  Procedures Procedures  {Document cardiac monitor, telemetry assessment procedure when appropriate:1}  Medications Ordered in ED Medications - No data to display  ED Course/ Medical Decision Making/ A&P   {   Click here for ABCD2, HEART and other calculatorsREFRESH Note before signing :1}                           Medical Decision Making Amount and/or Complexity of Data Reviewed Labs: ordered. Radiology: ordered.   ***  {Document critical care time when appropriate:1} {Document review of labs and clinical decision tools ie heart score, Chads2Vasc2 etc:1}  {Document your independent review of radiology images, and any outside records:1} {Document your discussion with family members, caretakers, and with consultants:1} {Document social determinants of health affecting pt's care:1} {Document your decision making why or why not admission, treatments were needed:1} Final Clinical Impression(s) / ED Diagnoses Final diagnoses:  None    Rx / DC Orders ED Discharge Orders     None

## 2022-12-12 NOTE — ED Triage Notes (Signed)
Coming from dialysis center, 1hour 15 min in treatment pt start  complaining of Chest Pain, which described as aching. [facility] was called the provide 324 aspirin 0.4mg  of nitroglycerin. PT Aox4, pain still present.

## 2022-12-12 NOTE — Discharge Instructions (Signed)
No evidence of heart attack seen today Please follow up with your heart doctor Return if you are worse at any time

## 2023-01-15 ENCOUNTER — Inpatient Hospital Stay (HOSPITAL_COMMUNITY)
Admission: EM | Admit: 2023-01-15 | Discharge: 2023-01-18 | DRG: 291 | Disposition: A | Discharge status: Home Health | Payer: No Typology Code available for payment source | Attending: Family Medicine | Admitting: Family Medicine

## 2023-01-15 ENCOUNTER — Encounter (HOSPITAL_COMMUNITY): Payer: Self-pay

## 2023-01-15 ENCOUNTER — Emergency Department (HOSPITAL_COMMUNITY): Payer: No Typology Code available for payment source

## 2023-01-15 DIAGNOSIS — I5033 Acute on chronic diastolic (congestive) heart failure: Secondary | ICD-10-CM | POA: Diagnosis not present

## 2023-01-15 DIAGNOSIS — E785 Hyperlipidemia, unspecified: Secondary | ICD-10-CM | POA: Diagnosis present

## 2023-01-15 DIAGNOSIS — F17210 Nicotine dependence, cigarettes, uncomplicated: Secondary | ICD-10-CM | POA: Diagnosis present

## 2023-01-15 DIAGNOSIS — R4182 Altered mental status, unspecified: Secondary | ICD-10-CM | POA: Diagnosis present

## 2023-01-15 DIAGNOSIS — E1122 Type 2 diabetes mellitus with diabetic chronic kidney disease: Secondary | ICD-10-CM | POA: Diagnosis present

## 2023-01-15 DIAGNOSIS — I5042 Chronic combined systolic (congestive) and diastolic (congestive) heart failure: Secondary | ICD-10-CM | POA: Diagnosis present

## 2023-01-15 DIAGNOSIS — I2510 Atherosclerotic heart disease of native coronary artery without angina pectoris: Secondary | ICD-10-CM | POA: Diagnosis present

## 2023-01-15 DIAGNOSIS — E11610 Type 2 diabetes mellitus with diabetic neuropathic arthropathy: Secondary | ICD-10-CM | POA: Diagnosis present

## 2023-01-15 DIAGNOSIS — K219 Gastro-esophageal reflux disease without esophagitis: Secondary | ICD-10-CM | POA: Diagnosis present

## 2023-01-15 DIAGNOSIS — Z515 Encounter for palliative care: Secondary | ICD-10-CM | POA: Diagnosis not present

## 2023-01-15 DIAGNOSIS — R9431 Abnormal electrocardiogram [ECG] [EKG]: Secondary | ICD-10-CM | POA: Insufficient documentation

## 2023-01-15 DIAGNOSIS — I480 Paroxysmal atrial fibrillation: Secondary | ICD-10-CM | POA: Diagnosis present

## 2023-01-15 DIAGNOSIS — J449 Chronic obstructive pulmonary disease, unspecified: Secondary | ICD-10-CM | POA: Diagnosis present

## 2023-01-15 DIAGNOSIS — E1151 Type 2 diabetes mellitus with diabetic peripheral angiopathy without gangrene: Secondary | ICD-10-CM | POA: Diagnosis present

## 2023-01-15 DIAGNOSIS — I132 Hypertensive heart and chronic kidney disease with heart failure and with stage 5 chronic kidney disease, or end stage renal disease: Secondary | ICD-10-CM | POA: Diagnosis present

## 2023-01-15 DIAGNOSIS — Z992 Dependence on renal dialysis: Secondary | ICD-10-CM | POA: Diagnosis not present

## 2023-01-15 DIAGNOSIS — N186 End stage renal disease: Secondary | ICD-10-CM | POA: Diagnosis present

## 2023-01-15 DIAGNOSIS — F319 Bipolar disorder, unspecified: Secondary | ICD-10-CM | POA: Diagnosis present

## 2023-01-15 DIAGNOSIS — Z794 Long term (current) use of insulin: Secondary | ICD-10-CM | POA: Diagnosis not present

## 2023-01-15 DIAGNOSIS — R079 Chest pain, unspecified: Secondary | ICD-10-CM | POA: Diagnosis present

## 2023-01-15 DIAGNOSIS — M898X9 Other specified disorders of bone, unspecified site: Secondary | ICD-10-CM | POA: Diagnosis present

## 2023-01-15 DIAGNOSIS — Z681 Body mass index (BMI) 19 or less, adult: Secondary | ICD-10-CM | POA: Diagnosis not present

## 2023-01-15 DIAGNOSIS — Z981 Arthrodesis status: Secondary | ICD-10-CM

## 2023-01-15 DIAGNOSIS — Z8674 Personal history of sudden cardiac arrest: Secondary | ICD-10-CM

## 2023-01-15 DIAGNOSIS — Z789 Other specified health status: Secondary | ICD-10-CM | POA: Diagnosis not present

## 2023-01-15 DIAGNOSIS — Z7901 Long term (current) use of anticoagulants: Secondary | ICD-10-CM

## 2023-01-15 DIAGNOSIS — F0393 Unspecified dementia, unspecified severity, with mood disturbance: Secondary | ICD-10-CM | POA: Diagnosis present

## 2023-01-15 DIAGNOSIS — E860 Dehydration: Secondary | ICD-10-CM | POA: Diagnosis present

## 2023-01-15 DIAGNOSIS — I160 Hypertensive urgency: Secondary | ICD-10-CM | POA: Diagnosis present

## 2023-01-15 DIAGNOSIS — Z66 Do not resuscitate: Secondary | ICD-10-CM | POA: Diagnosis present

## 2023-01-15 DIAGNOSIS — E1169 Type 2 diabetes mellitus with other specified complication: Secondary | ICD-10-CM | POA: Diagnosis present

## 2023-01-15 DIAGNOSIS — S72142A Displaced intertrochanteric fracture of left femur, initial encounter for closed fracture: Secondary | ICD-10-CM | POA: Diagnosis not present

## 2023-01-15 DIAGNOSIS — Z7984 Long term (current) use of oral hypoglycemic drugs: Secondary | ICD-10-CM

## 2023-01-15 DIAGNOSIS — R112 Nausea with vomiting, unspecified: Secondary | ICD-10-CM

## 2023-01-15 DIAGNOSIS — Z90710 Acquired absence of both cervix and uterus: Secondary | ICD-10-CM

## 2023-01-15 DIAGNOSIS — Z86718 Personal history of other venous thrombosis and embolism: Secondary | ICD-10-CM

## 2023-01-15 DIAGNOSIS — Z7902 Long term (current) use of antithrombotics/antiplatelets: Secondary | ICD-10-CM

## 2023-01-15 DIAGNOSIS — I509 Heart failure, unspecified: Secondary | ICD-10-CM

## 2023-01-15 DIAGNOSIS — I4721 Torsades de pointes: Secondary | ICD-10-CM | POA: Diagnosis present

## 2023-01-15 DIAGNOSIS — G4730 Sleep apnea, unspecified: Secondary | ICD-10-CM | POA: Diagnosis present

## 2023-01-15 DIAGNOSIS — Z711 Person with feared health complaint in whom no diagnosis is made: Secondary | ICD-10-CM | POA: Diagnosis not present

## 2023-01-15 DIAGNOSIS — Z955 Presence of coronary angioplasty implant and graft: Secondary | ICD-10-CM

## 2023-01-15 DIAGNOSIS — Z9049 Acquired absence of other specified parts of digestive tract: Secondary | ICD-10-CM

## 2023-01-15 DIAGNOSIS — I252 Old myocardial infarction: Secondary | ICD-10-CM

## 2023-01-15 DIAGNOSIS — Z8701 Personal history of pneumonia (recurrent): Secondary | ICD-10-CM

## 2023-01-15 DIAGNOSIS — Z792 Long term (current) use of antibiotics: Secondary | ICD-10-CM

## 2023-01-15 DIAGNOSIS — Z79899 Other long term (current) drug therapy: Secondary | ICD-10-CM

## 2023-01-15 HISTORY — DX: Nausea with vomiting, unspecified: R11.2

## 2023-01-15 LAB — BASIC METABOLIC PANEL
Anion gap: 22 — ABNORMAL HIGH (ref 5–15)
BUN: 27 mg/dL — ABNORMAL HIGH (ref 8–23)
CO2: 20 mmol/L — ABNORMAL LOW (ref 22–32)
Calcium: 9.5 mg/dL (ref 8.9–10.3)
Chloride: 92 mmol/L — ABNORMAL LOW (ref 98–111)
Creatinine, Ser: 5.65 mg/dL — ABNORMAL HIGH (ref 0.44–1.00)
GFR, Estimated: 8 mL/min — ABNORMAL LOW (ref >60)
Glucose, Bld: 193 mg/dL — ABNORMAL HIGH (ref 70–99)
Potassium: 4.9 mmol/L (ref 3.5–5.1)
Sodium: 134 mmol/L — ABNORMAL LOW (ref 135–145)

## 2023-01-15 LAB — LIPASE, BLOOD: Lipase: 21 U/L (ref 11–51)

## 2023-01-15 LAB — CBC
HCT: 42.5 % (ref 36.0–46.0)
Hemoglobin: 12.9 g/dL (ref 12.0–15.0)
MCH: 29.5 pg (ref 26.0–34.0)
MCHC: 30.4 g/dL (ref 30.0–36.0)
MCV: 97 fL (ref 80.0–100.0)
Platelets: 197 10*3/uL (ref 150–400)
RBC: 4.38 MIL/uL (ref 3.87–5.11)
RDW: 16.8 % — ABNORMAL HIGH (ref 11.5–15.5)
WBC: 6.3 10*3/uL (ref 4.0–10.5)
nRBC: 0 % (ref 0.0–0.2)

## 2023-01-15 LAB — HEPATIC FUNCTION PANEL
ALT: 28 U/L (ref 0–44)
AST: 31 U/L (ref 15–41)
Albumin: 3.6 g/dL (ref 3.5–5.0)
Alkaline Phosphatase: 148 U/L — ABNORMAL HIGH (ref 38–126)
Bilirubin, Direct: 0.3 mg/dL — ABNORMAL HIGH (ref 0.0–0.2)
Indirect Bilirubin: 1.1 mg/dL — ABNORMAL HIGH (ref 0.3–0.9)
Total Bilirubin: 1.4 mg/dL — ABNORMAL HIGH (ref 0.3–1.2)
Total Protein: 7.7 g/dL (ref 6.5–8.1)

## 2023-01-15 LAB — HEMOGLOBIN A1C
Hgb A1c MFr Bld: 7.5 % — ABNORMAL HIGH (ref 4.8–5.6)
Mean Plasma Glucose: 168.55 mg/dL

## 2023-01-15 LAB — TROPONIN I (HIGH SENSITIVITY)
Troponin I (High Sensitivity): 60 ng/L — ABNORMAL HIGH (ref <18)
Troponin I (High Sensitivity): 61 ng/L — ABNORMAL HIGH (ref <18)
Troponin I (High Sensitivity): 66 ng/L — ABNORMAL HIGH (ref <18)
Troponin I (High Sensitivity): 62 ng/L — ABNORMAL HIGH (ref <18)

## 2023-01-15 LAB — BRAIN NATRIURETIC PEPTIDE: B Natriuretic Peptide: >4500 pg/mL — ABNORMAL HIGH (ref 0.0–100.0)

## 2023-01-15 MED ORDER — TIOTROPIUM BROMIDE MONOHYDRATE 1.25 MCG/ACT IN AERS: 2.0000 | INHALATION_SPRAY | Freq: Every morning | RESPIRATORY_TRACT | Status: DC

## 2023-01-15 MED ORDER — ISOSORBIDE MONONITRATE ER 30 MG PO TB24
30.0000 mg | ORAL_TABLET | Freq: Every day | ORAL | Status: DC
  Administered 2023-01-16 – 2023-01-18 (×3): 30 mg via ORAL
  Filled 2023-01-15 (×3): qty 1

## 2023-01-15 MED ORDER — CLOPIDOGREL BISULFATE 75 MG PO TABS
75.0000 mg | ORAL_TABLET | Freq: Every day | ORAL | Status: DC
  Administered 2023-01-15 – 2023-01-18 (×4): 75 mg via ORAL
  Filled 2023-01-15 (×4): qty 1

## 2023-01-15 MED ORDER — UMECLIDINIUM BROMIDE 62.5 MCG/ACT IN AEPB
1.0000 | INHALATION_SPRAY | Freq: Every day | RESPIRATORY_TRACT | Status: DC
  Administered 2023-01-17 – 2023-01-18 (×2): 1 via RESPIRATORY_TRACT
  Filled 2023-01-15: qty 7

## 2023-01-15 MED ORDER — APIXABAN 2.5 MG PO TABS
2.5000 mg | ORAL_TABLET | Freq: Two times a day (BID) | ORAL | Status: DC
  Administered 2023-01-16 – 2023-01-18 (×5): 2.5 mg via ORAL
  Filled 2023-01-15 (×5): qty 1

## 2023-01-15 MED ORDER — CALCIUM CARBONATE 1250 (500 CA) MG PO TABS
1250.0000 mg | ORAL_TABLET | Freq: Two times a day (BID) | ORAL | Status: DC
  Administered 2023-01-16 – 2023-01-18 (×4): 1250 mg via ORAL
  Filled 2023-01-15 (×4): qty 1

## 2023-01-15 MED ORDER — ATORVASTATIN CALCIUM 80 MG PO TABS
80.0000 mg | ORAL_TABLET | Freq: Every day | ORAL | Status: DC
  Administered 2023-01-15 – 2023-01-17 (×3): 80 mg via ORAL
  Filled 2023-01-15 (×3): qty 1

## 2023-01-15 MED ORDER — MELATONIN 5 MG PO TABS
10.0000 mg | ORAL_TABLET | Freq: Every day | ORAL | Status: DC
  Administered 2023-01-15 – 2023-01-17 (×3): 10 mg via ORAL
  Filled 2023-01-15 (×3): qty 2

## 2023-01-15 MED ORDER — HYDRALAZINE HCL 10 MG PO TABS
10.0000 mg | ORAL_TABLET | Freq: Once | ORAL | Status: AC
  Administered 2023-01-15: 10 mg via ORAL
  Filled 2023-01-15: qty 1

## 2023-01-15 MED ORDER — PANTOPRAZOLE SODIUM 40 MG PO TBEC
40.0000 mg | DELAYED_RELEASE_TABLET | Freq: Every day | ORAL | Status: DC
  Administered 2023-01-15 – 2023-01-18 (×4): 40 mg via ORAL
  Filled 2023-01-15 (×4): qty 1

## 2023-01-15 MED ORDER — METOPROLOL SUCCINATE ER 25 MG PO TB24
25.0000 mg | ORAL_TABLET | Freq: Every day | ORAL | Status: DC
  Administered 2023-01-16 – 2023-01-18 (×3): 25 mg via ORAL
  Filled 2023-01-15 (×3): qty 1

## 2023-01-15 MED ORDER — ONDANSETRON 4 MG PO TBDP
2.0000 mg | ORAL_TABLET | Freq: Once | ORAL | Status: AC
  Administered 2023-01-15: 2 mg via ORAL
  Filled 2023-01-15: qty 1

## 2023-01-15 MED ORDER — NITROGLYCERIN 0.4 MG SL SUBL: 0.4000 mg | SUBLINGUAL_TABLET | SUBLINGUAL | Status: DC | PRN

## 2023-01-15 MED ORDER — RENA-VITE PO TABS
1.0000 | ORAL_TABLET | Freq: Every day | ORAL | Status: DC
  Administered 2023-01-15 – 2023-01-17 (×3): 1 via ORAL
  Filled 2023-01-15 (×3): qty 1

## 2023-01-15 NOTE — H&P (Addendum)
[name] Admission History and Physical Service Pager: [phone]  [name] Medical record number: [id number] Date of Birth: [birth year] Age: 72 y.o. Gender: female  Primary Care Provider: [name] Consultants: cardiology Code Status: Previously full code, will confirm this with daughter Robin [name]) when we can get in touch with her. Advanced directive reflects "please save me in any and all conditions." Son Robin [name] confirms this is in line with her wishes.  Preferred Emergency Contact: Robin [name], Robin [name] [phone]. Daughter Robin [name]) is POA but did not answer phone [phone]  Chief Complaint: chest pain  Assessment and Plan: Robin [name] is a 72 y.o. female presenting with chest pain . Differential for presentation of this includes not tolerating home anti-anginals, MSK strain 2/2 vomiting, ACS, unstable angina, PE, pneumonia.  Patient does take Imdur and metoprolol at home .  Suspect that her chest pain could be due to her not being able to tolerate her regular medications due to nausea and vomiting.  Could also potentially be musculoskeletal strain from vomiting for 5 days.  Her EKG showed some nonspecific changes, with slight T wave inversion in leads II and III.  No ST elevation on EKG. Troponins only slightly elevated at 60.  Low suspicion for PE as patient takes Eliquis daily for hx DVT and is neither tahcycardic nor hypoxic.  Suspicion for pneumonia or other infectious causes as patient is afebrile with no leukocytosis.  Consulted cardiology on the phone and spoke with Dr. Diona Browner due to some slight T-wave inversions noted on EKG, chest pain at rest, and concern for unstable angina. Cardiology recommended to cycle her troponins, repeat an EKG in the morning, continue home beta blocker and aspirin, and to not start heparin on this patient. They recommended that she does not need evaluation tonight and they will evaluate her in the morning.   [name]     * (Principal) Chest pain at rest     Known history of CAD with stent in place. Most recent cath in 03/24.  Discussed with Dr. Diona Browner from [name]. HEART Score 6. Recommendation per  cardiology was to admit for tele monitoring overnight, continue her home  antiplatelets, cycle troponin, and repeat EKG in the morning.   - Admit to progressive unit, inpatient status - Telemetry monitoring - Cycle troponin this evening, BNP - Repeat EKG in the morning - Continue home anti-anginals: Imdur and Toprol - SL Nitroglycerin and EKG STAT for recurrent CP  - Continue home Plavix and Statin - Appreciate cardiology recommendations - Considered laoding aspirin but with low suspicion for ACS and pt already  on antiplatelet and DOAC decided not to due to bleeding risk        Hypertension due to end stage renal disease caused by type 2 diabetes  mellitus, on dialysis (HCC)     Bps elevated in the ED but trending down, most recent was 149/99 - given hydralazine 10mg   - Continue at home BP medications - imdur 30mg , metoprolol 25mg          Congestive heart failure (CHF) (HCC)     Enlarged cardiac silhouette with vascular congestion on CXR but not  grossly volume overloaded on exam. Had HD just yesterday on schedule,  though some question of incomplete session on Monday. Last echo in 03/24  with LVEF 45-50%, global hypokinesis, Grade 2 DDF.  - Volume management with HD - BNP is pending  ESRD (end stage renal disease) (HCC)     MWF Dialysis patient, did not receive full dialysis treatment Monday  but had a full session on Wednesday by report.  - consult nephrology to set up dialysis for tomorrow - RFP in am - Continue home phos binder and renal vitamin        Transient altered mental status     Patient did have some confusion on exam, and son states that patient's  mental status will wax and wane.  States this is not uncommon if patient  is not taking her medications  correctly -Delirium precautions         Nausea & vomiting     Several days. Accompanied by diarrhea per son. Unclear etiology but  seems to be improving with time. Electrolytes not far from baseline,  though an imperfect marker in this ESRD patient.  - Need to take care with antiemetics given prolonged QTc - PO trial        Chronic and Stable Problems:  T2DM - Hb A1C ordered, unclear if pt takes insulin still, trend CBG on BMP for now Bipolar disorder - appears to not be on medication for this Hx DVT - eliquis 2.5mg    FEN/GI: renal diet  VTE Prophylaxis: eliquis 2.5mg  bid  Disposition: progressive, inpatient status  History of Present Illness:  Robin [name] is a 72 y.o. female presenting with chest pain that began last night. Pt states that it is located in the center of her chest and is non-radiating, worse with exertion.  Pt is a MWF dialysis patient, last session was reportedly yesterday (8/21). Pt also reports SOB.   Spoke with son on the phone.  He states that patient has been sick with nausea vomiting and diarrhea for 5 to 6 days.  States that she tried to go to her dialysis appointment on Monday but had to leave early.  He states that she went back to dialysis yesterday and they "drained too much fluid off of her ". He states that she will get chest pain when she is dehydrated, and she has been vomiting with dry mucous membranes.  States that her home health nurse advised her to come to the [name].  Son does note that patient had not taken her medications Friday night, and he is unsure if she has been taking them as she is supposed to since she got sick.  Son states that patient is typically very cognitively intact but she will become confused if she is not taking her medications properly.  In the ED, patient had chest x-ray showing vascular congestion.  EKG with some T wave inversions in leads II and III.  Initial troponin 60, repeat troponin 61.  ED provider said that they  spoke with cardiology who recommended admission for further chest pain workup.  Review Of Systems: Per HPI with the following additions: none  Pertinent Past Medical History: NSTEMI, HTN, T2DM, CAD, ESRD (MWF dialysis), hx DVT, bipolar Remainder reviewed in history tab.   Pertinent Past Surgical History: Coronary stent x2, looks like last heart cath was 07/2022 for unstable angina Remainder reviewed in history tab.   Pertinent Social History: Tobacco use: 1PPD Alcohol use: no Other Substance use: no Lives with grandson   Pertinent Family History: none  Remainder reviewed in history tab.   Important Outpatient Medications: Eliquis 5mg  bid Protonix 40mg  NTG 0.4mg  SL PRN, isosorbide mononitrate 30mg  every day Metoprolol 25mg  QD Plavix 75mg  every day Lipitor 80mg  every day Novolog  6u TID PRN, levemir 3U QD - though reported not taking  Remainder reviewed in medication history.   Objective: BP (!) 149/99   Pulse 63   Temp 97.8 F (36.6 C) (Axillary)   Resp 14   Ht 5\' 4"  (1.626 m)   Wt 61.2 kg   SpO2 100%   BMI 23.16 kg/m  Exam: General: No acute distress, laying in bed, intermittently confused (thinks she's at home with her grandbabies) Eyes: EOMI ENTM: MMM Neck: no JVD noted Cardiovascular: RRR, no murmurs Respiratory: CTAB, no increased work of breathing Gastrointestinal: soft, non-tender MSK: no BLE swelling, s/p TMA of R foot and amputation of 1st and 2nd toe on left.  Neuro: Oriented to self. Thinks she is at home with her grandbabies. Alone in room.  Labs:  CBC BMET  Recent Labs  Lab 01/15/23 1223  WBC 6.3  HGB 12.9  HCT 42.5  PLT 197   Recent Labs  Lab 01/15/23 1223  NA 134*  K 4.9  CL 92*  CO2 20*  BUN 27*  CREATININE 5.65*  GLUCOSE 193*  CALCIUM 9.5    Pertinent additional labs: Alk phos 148 Indirect bili 1.1, direct bili 0.3 Troponin 60 > 61  EKG: Normal sinus rhythm. Some t-wave inversion in lead II, III. QRS 87ms. Qtc .     Imaging Studies Performed:  CXR IMPRESSION: Enlarged heart with vascular congestion.  Right IJ line.  I independently reviewed CXR and agree with radiologist interpretation.  Para March, DO 01/15/2023, 8:53 PM PGY-1, [facility]  FPTS Intern pager: [phone], text pages welcome Secure chat group [name] Teaching Service     I have evaluated this patient along with Dr. Rexene Alberts  and reviewed the above note, making necessary revisions.  Dorothyann Gibbs, MD 01/15/2023, 9:22 PM PGY-3, [facility]

## 2023-01-15 NOTE — ED Notes (Signed)
ED TO INPATIENT HANDOFF REPORT  ED Nurse Name and Phone #: Darneisha Windhorst/ [phone]  S Name/Age/Gender Robin [name] 71 y.o. female Room/Bed: 004C/004C  Code Status   Code Status: Full Code  Home/SNF/Other Home Patient oriented to: self, place, time, and situation Is this baseline? Yes   Triage Complete: Triage complete  Chief Complaint Chest pain at rest [R07.9]  Triage Note Pt is coming in for chest pain accompanied with shortness of breath that started last night. She is also a dialysis patient who gets dialysis on Monday/Wednesday/Fridays. The pain is midsternal, she doe shave difficult completing sentences but she maintains a regular breathing pattern in triage.    Allergies No Known Allergies  Level of Care/Admitting Diagnosis ED Disposition     ED Disposition  Admit   Condition  --   Comment  Hospital Area: [facility] [[id number]]  Level of Care: Progressive [102]  Admit to Progressive based on following criteria: CARDIOVASCULAR & THORACIC of moderate stability with acute coronary syndrome symptoms/low risk myocardial infarction/hypertensive urgency/arrhythmias/heart failure potentially compromising stability and stable post cardiovascular intervention patients.  May admit patient to [facility] or [facility] if equivalent level of care is available:: No  Covid Evaluation: Asymptomatic - no recent exposure (last 10 days) testing not required  Diagnosis: Chest pain at rest [315176]  Admitting Physician: Para March [[id number]]  Attending Physician: Carney Living [phone]  Certification:: I certify this patient will need inpatient services for at least 2 midnights  Expected Medical Readiness: 01/18/2023          B Medical/Surgery History Past Medical History:  Diagnosis Date   Acute delirium 11/18/2014   Acute encephalopathy    Acute on chronic respiratory failure with hypoxia (HCC) 09/09/2016   Acute respiratory failure with hypoxia (HCC)  11/29/2015   Anemia in chronic kidney disease 12/09/2014   Anxiety    Arthritis    Benign hypertension    Bipolar affective disorder (HCC) 12/09/2014   CAD (coronary artery disease), native coronary artery with 2 stents  11/17/2014   Charcot foot due to diabetes mellitus (HCC)    Chronic combined systolic and diastolic CHF (congestive heart failure) (HCC) 11/18/2014   Closed left ankle fracture 11/17/2014   Confusion 01/21/2015   Depression    Diabetes mellitus without complication (HCC)    End stage renal disease on dialysis (HCC)    Fracture dislocation of ankle 11/17/2014   GERD (gastroesophageal reflux disease)    Heart murmur    History of blood transfusion    History of bronchitis    History of pneumonia    Hyperlipidemia 03/26/2015   Hypertension associated with diabetes (HCC) 11/18/2014   Hypertensive heart/renal disease with failure (HCC) 12/09/2014   Hypokalemia 11/17/2014   Multiple falls 01/21/2015   Obesity 11/17/2014   Onychomycosis 10/07/2016   Right leg DVT (HCC) 12/05/2015   SIRS (systemic inflammatory response syndrome) (HCC) 08/08/2016   Sleep apnea    Unstable angina (HCC) 12/26/2017   Ventricular tachycardia (HCC)    Past Surgical History:  Procedure Laterality Date   A/V FISTULAGRAM N/A 01/03/2020   Procedure: A/V FISTULAGRAM;  Surgeon: Nada Libman, MD;  Location: [facility];  Service: Cardiovascular;  Laterality: N/A;   A/V FISTULAGRAM Left 03/12/2021   Procedure: A/V FISTULAGRAM;  Surgeon: Nada Libman, MD;  Location: [facility];  Service: Cardiovascular;  Laterality: Left;   ABDOMINAL HYSTERECTOMY     ANGIOPLASTY Left 01/26/2020   Procedure: LEFT  ARM GRAPH THROMBECTOMY WITH BALLON ANGIOPLASTY;  Surgeon: Maeola Harman, MD;  Location: [facility] OR;  Service: Vascular;  Laterality: Left;   ANKLE CLOSED REDUCTION N/A 11/17/2014   Procedure: CLOSED REDUCTION ANKLE;  Surgeon: Frederico Hamman, MD;  Location: [facility] OR;  Service: Orthopedics;  Laterality:  N/A;   ANKLE FUSION Left 03/16/2015   Procedure: Left Tibiocalcaneal Fusion;  Surgeon: Nadara Mustard, MD;  Location: [facility] OR;  Service: Orthopedics;  Laterality: Left;   APPLICATION OF WOUND VAC Right 08/06/2016   Procedure: APPLICATION OF PREVENA WOUND VAC;  Surgeon: Nadara Mustard, MD;  Location: [facility] OR;  Service: Orthopedics;  Laterality: Right;   AV FISTULA PLACEMENT Left may-2016   done at [facility]   CARDIAC CATHETERIZATION     2 stent    CHOLECYSTECTOMY     CORONARY PRESSURE/FFR STUDY N/A 07/31/2022   Procedure: INTRAVASCULAR PRESSURE WIRE/FFR STUDY;  Surgeon: Orbie Pyo, MD;  [facility]: [facility];  Service: Cardiovascular;  Laterality: N/A;   CORONARY STENT PLACEMENT     FISTULOGRAM Left 01/26/2020   Procedure: FISTULOGRAM;  Surgeon: Maeola Harman, MD;  Location: [facility] OR;  Service: Vascular;  Laterality: Left;   FOOT ARTHRODESIS Right 08/06/2016   Procedure: Right Foot Fusion Lisfranc Joint;  Surgeon: Nadara Mustard, MD;  Location: [facility] OR;  Service: Orthopedics;  Laterality: Right;   HARDWARE REMOVAL Left 03/16/2015   Procedure: Removal Hardware Left Ankle;  Surgeon: Nadara Mustard, MD;  Location: [facility] OR;  Service: Orthopedics;  Laterality: Left;   IR AV DIALY SHUNT INTRO NEEDLE/INTRACATH INITIAL W/PTA/IMG LEFT  01/05/2018   IR AV DIALY SHUNT INTRO NEEDLE/INTRACATH INITIAL W/PTA/IMG LEFT  10/18/2022   IR FLUORO GUIDE CV LINE RIGHT  10/18/2022   IR US GUIDE VASC ACCESS LEFT  01/05/2018   IR US GUIDE VASC ACCESS RIGHT  10/18/2022   LEFT HEART CATH AND CORONARY ANGIOGRAPHY N/A 12/28/2017   Procedure: LEFT HEART CATH AND CORONARY ANGIOGRAPHY;  Surgeon: Kathleene Hazel, MD;  [facility];  Service: Cardiovascular;  Laterality: N/A;   LEFT HEART CATH AND CORONARY ANGIOGRAPHY N/A 07/31/2022   Procedure: LEFT HEART CATH AND CORONARY ANGIOGRAPHY;  Surgeon: Orbie Pyo, MD;  [facility];  Service: Cardiovascular;  Laterality: N/A;   LOWER  EXTREMITY ANGIOGRAPHY N/A 03/21/2021   Procedure: LOWER EXTREMITY ANGIOGRAPHY;  Surgeon: Cephus Shelling, MD;  [facility];  Service: Cardiovascular;  Laterality: N/A;   ORIF ANKLE FRACTURE Left 11/20/2014   Procedure: OPEN REDUCTION INTERNAL FIXATION (ORIF) ANKLE FRACTURE;  Surgeon: Sheral Apley, MD;  Location: [facility];  Service: Orthopedics;  Laterality: Left;   PERIPHERAL VASCULAR BALLOON ANGIOPLASTY  01/03/2020   Procedure: PERIPHERAL VASCULAR BALLOON ANGIOPLASTY;  Surgeon: Nada Libman, MD;  [facility];  Service: Cardiovascular;;   PERIPHERAL VASCULAR BALLOON ANGIOPLASTY Left 03/12/2021   Procedure: PERIPHERAL VASCULAR BALLOON ANGIOPLASTY;  Surgeon: Nada Libman, MD;  [facility];  Service: Cardiovascular;  Laterality: Left;   PERIPHERAL VASCULAR BALLOON ANGIOPLASTY  08/22/2021   Procedure: PERIPHERAL VASCULAR BALLOON ANGIOPLASTY;  Surgeon: Cephus Shelling, MD;  [facility];  Service: Cardiovascular;;  Left AVF PTA   TONSILLECTOMY       A IV Location/Drains/Wounds Patient Lines/Drains/Airways Status     Active Line/Drains/Airways     Name Placement date Placement time Site Days   Peripheral IV 01/15/23 22 G 1.75" Left;Anterior Forearm 01/15/23  1359  Forearm  less than 1   Fistula / Graft Left Upper arm Arteriovenous fistula 09/30/14  --  Upper arm  3029   Fistula / Graft Left Upper arm Arteriovenous fistula --  --  Upper arm  --   Fistula / Graft Left Upper arm --  --  Upper arm  --   Hemodialysis Catheter Right Internal jugular 10/18/22  1017  Internal jugular  89   Wound / Incision (Open or Dehisced) 10/17/22 (MARSI) Medical Adhesive-Related Skin Injury;Irritant Dermatitis (Moisture Associated Skin Damage) Labia Anterior 10/17/22  1515  Labia  90            Intake/Output Last 24 hours No intake or output data in the 24 hours ending 01/15/23 2036  Labs/Imaging Results for orders placed or  performed during the hospital encounter of 01/15/23 (from the past 48 hour(s))  Basic metabolic panel     Status: Abnormal   Collection Time: 01/15/23 12:23 PM  Result Value Ref Range   Sodium 134 (L) 135 - 145 mmol/L   Potassium 4.9 3.5 - 5.1 mmol/L   Chloride 92 (L) 98 - 111 mmol/L   CO2 20 (L) 22 - 32 mmol/L   Glucose, Bld 193 (H) 70 - 99 mg/dL    Comment: Glucose reference range applies only to samples taken after fasting for at least 8 hours.   BUN 27 (H) 8 - 23 mg/dL   Creatinine, Ser 2.44 (H) 0.44 - 1.00 mg/dL   Calcium 9.5 8.9 - 01.0 mg/dL   GFR, Estimated 8 (L) >60 mL/min    Comment: (NOTE) Calculated using the CKD-EPI Creatinine Equation (2021)    Anion gap 22 (H) 5 - 15    Comment: ELECTROLYTES REPEATED TO VERIFY Performed at [name] Lab, [address], [place], [place] [address]   CBC     Status: Abnormal   Collection Time: 01/15/23 12:23 PM  Result Value Ref Range   WBC 6.3 4.0 - 10.5 K/uL   RBC 4.38 3.87 - 5.11 MIL/uL   Hemoglobin 12.9 12.0 - 15.0 g/dL   HCT 66.4 40.3 - 47.4 %   MCV 97.0 80.0 - 100.0 fL   MCH 29.5 26.0 - 34.0 pg   MCHC 30.4 30.0 - 36.0 g/dL   RDW 25.9 (H) 56.3 - 87.5 %   Platelets 197 150 - 400 K/uL   nRBC 0.0 0.0 - 0.2 %    Comment: Performed at [name] Lab, [address] [place], [place] [address]  Lipase, blood     Status: None   Collection Time: 01/15/23  1:25 PM  Result Value Ref Range   Lipase 21 11 - 51 U/L    Comment: Performed at [facility], [address], [place], [place] [address]  Troponin I (High Sensitivity)     Status: Abnormal   Collection Time: 01/15/23  1:47 PM  Result Value Ref Range   Troponin I (High Sensitivity) 60 (H) <18 ng/L    Comment: (NOTE) Elevated high sensitivity troponin I (hsTnI) values and significant  changes across serial measurements may suggest ACS but many other  chronic and acute conditions are known to elevate hsTnI results.  Refer to the "Links" section for chest pain  algorithms and additional  guidance. Performed at [facility] Lab, [address], [place], [place] [address]   Hepatic function panel     Status: Abnormal   Collection Time: 01/15/23  1:47 PM  Result Value Ref Range  Total Protein 7.7 6.5 - 8.1 g/dL   Albumin 3.6 3.5 - 5.0 g/dL   AST 31 15 - 41 U/L   ALT 28 0 - 44 U/L   Alkaline Phosphatase 148 (H) 38 - 126 U/L   Total Bilirubin 1.4 (H) 0.3 - 1.2 mg/dL   Bilirubin, Direct 0.3 (H) 0.0 - 0.2 mg/dL   Indirect Bilirubin 1.1 (H) 0.3 - 0.9 mg/dL    Comment: Performed at [facility] Lab, [address] [place], [place] [address]  Troponin I (High Sensitivity)     Status: Abnormal   Collection Time: 01/15/23  4:06 PM  Result Value Ref Range   Troponin I (High Sensitivity) 61 (H) <18 ng/L    Comment: (NOTE) Elevated high sensitivity troponin I (hsTnI) values and significant  changes across serial measurements may suggest ACS but many other  chronic and acute conditions are known to elevate hsTnI results.  Refer to the "Links" section for chest pain algorithms and additional  guidance. Performed at [facility] Lab, [address] [place], [place] [address]    DG Chest 2 View  Result Date: 01/15/2023 CLINICAL DATA:  Chest pain EXAM: CHEST - 2 VIEW COMPARISON:  12/12/2022 x-ray FINDINGS: Enlarged cardiopericardial silhouette. Calcified aorta. Central vascular congestion. No pneumothorax, effusion or edema. Stable right IJ double-lumen catheter with tip along the right atrium. Left axillary vascular stent. The nodular density seen on prior x-ray in the left upper lung is not well seen on the current examination. Overlapping cardiac leads. IMPRESSION: Enlarged heart with vascular congestion.  Right IJ line. Electronically Signed   By: Karen Kays M.D.   On: 01/15/2023 12:42    Pending Labs Unresulted Labs (From admission, onward)     Start     Ordered   01/16/23 0500  Renal function panel  Tomorrow morning,   R        01/15/23  2030   01/15/23 2034  Brain natriuretic peptide  Once,   R        01/15/23 2033   01/15/23 2009  Hemoglobin A1c  Once,   R        01/15/23 2030            Vitals/Pain Today's Vitals   01/15/23 1630 01/15/23 1631 01/15/23 1700 01/15/23 1908  BP: (!) 172/110 (!) 172/110 (!) 149/99   Pulse: 77  63   Resp:   14   Temp:    97.8 F (36.6 C)  TempSrc:    Axillary  SpO2:   100%   Weight:      Height:      PainSc:        Isolation Precautions No active isolations  Medications Medications  atorvastatin (LIPITOR) tablet 80 mg (has no administration in time range)  isosorbide mononitrate (IMDUR) 24 hr tablet 30 mg (has no administration in time range)  metoprolol succinate (TOPROL-XL) 24 hr tablet 25 mg (has no administration in time range)  clopidogrel (PLAVIX) tablet 75 mg (has no administration in time range)  apixaban (ELIQUIS) tablet 2.5 mg (has no administration in time range)  Melatonin TABS 10 mg (has no administration in time range)  Tiotropium Bromide Monohydrate AERS 2 puff (has no administration in time range)  nitroGLYCERIN (NITROSTAT) SL tablet 0.4 mg (has no administration in time range)  calcium carbonate (OS-CAL) tablet 600 mg (has no administration in time range)  multivitamin (RENA-VIT) tablet 1 tablet (has no administration in time range)  ondansetron (ZOFRAN-ODT) disintegrating  tablet 2 mg (2 mg Oral Given 01/15/23 1257)  hydrALAZINE (APRESOLINE) tablet 10 mg (10 mg Oral Given 01/15/23 1631)    Mobility walks with device     Focused Assessments     R Recommendations: See Admitting Provider Note  Report given to:   Additional Notes: Pt came in for CP and both her trops were elevated. Pt has a 22 G in L FA. Her BP has been elevated and she got hydralazine earlier and it came down. Family is at bedside.

## 2023-01-15 NOTE — Assessment & Plan Note (Addendum)
Patient did have some confusion on exam, and son states that patient's mental status will wax and wane.  States this is not uncommon if patient is not taking her medications correctly -Delirium precautions

## 2023-01-15 NOTE — Assessment & Plan Note (Addendum)
Discussed with Dr. Diona Browner from [facility]. HEART Score 6. Recommendation per cardiology was to admit for tele monitoring overnight, continue her home antiplatelets, cycle troponin, and repeat EKG in the morning.   - Admit to progressive unit, inpatient status - Cycle troponin this evening, BNP - Repeat EKG in the morning - Appreciate cardiology recommendations - Considered treating with aspirin but with low suspicion for ACS and pt already on antiplatelet and DOAC decided not to due to bleeding risk

## 2023-01-15 NOTE — Assessment & Plan Note (Addendum)
Known history of CAD with stent in place. Most recent cath in 03/24. Troponins 60 > 61 > 66 > 62. BNP >45000.  Low suspicion for ACS and pt already on antiplatelet and DOAC, decided not to load with ASA due to bleeding risk - Admit to progressive unit, inpatient status - Telemetry monitoring - Per cards - Repeat EKG in the morning- QT 540 - Continue home anti-anginals: Imdur and Toprol - SL Nitroglycerin and EKG STAT for recurrent CP  - Continue home Plavix and Statin - Appreciate ongoing cardiology recommendations

## 2023-01-15 NOTE — ED Triage Notes (Signed)
Pt is coming in for chest pain accompanied with shortness of breath that started last night. She is also a dialysis patient who gets dialysis on Monday/Wednesday/Fridays. The pain is midsternal, she doe shave difficult completing sentences but she maintains a regular breathing pattern in triage.

## 2023-01-15 NOTE — Assessment & Plan Note (Addendum)
Bps elevated in the ED but trending down, most recent was 149/99 - given hydralazine 10mg   - Continue at home BP medications - imdur 30mg , metoprolol 25mg 

## 2023-01-15 NOTE — Assessment & Plan Note (Addendum)
Enlarged cardiac silhouette with vascular congestion on CXR but not grossly volume overloaded on exam. Had HD just yesterday on schedule, though some question of incomplete session on Monday. Last echo in 03/24 with LVEF 45-50%, global hypokinesis, Grade 2 DDF. BNP >45000 - Volume management with HD - Cardiology to see today

## 2023-01-15 NOTE — Assessment & Plan Note (Signed)
-   MWF Dialysis patient, did not receive full dialysis treatment Monday but went Tuesday - consult nephrology to set up dialysis for tomorrow - RFP

## 2023-01-15 NOTE — Assessment & Plan Note (Addendum)
MWF Dialysis patient, did not receive full dialysis treatment Monday but had a full session on Wednesday by report.  - Dialysis today - Continue home phos binder and renal vitamin

## 2023-01-15 NOTE — Assessment & Plan Note (Addendum)
Several days. Accompanied by diarrhea per son. Unclear etiology but seems to be improving with time. Electrolytes not far from baseline, though an imperfect marker in this ESRD patient. Ongoing this AM - Divide medication administration - Tigan PRN - Need to take care with antiemetics given prolonged QTc - PO trial

## 2023-01-15 NOTE — ED Notes (Signed)
Patient transported to X-ray 

## 2023-01-15 NOTE — ED Provider Notes (Signed)
[place] EMERGENCY DEPARTMENT AT [facility] Provider Note   CSN: [id number] Arrival date & time: 01/15/23  1131     History  Chief Complaint  Patient presents with   Chest Pain    Robin [name] is a 72 y.o. female history of CHF, bipolar disorder, CAD, diabetes type 2, end-stage renal disease on hemodialysis Monday Wednesday Friday last session was on Wednesday, hypertension, hyperlipidemia, DVT on Eliquis 2.5 mg twice daily presents today for evaluation of chest pain.  Symptoms started last night.  Pain is in the center of her chest and left side of chest, non-radiating, intermittent.  She denies any fever, cough.  Per family at bedside patient has had nausea and vomiting and decreased appetite for the last 1 week.  Family states that patient has thrown up some of her meds recently.  Family states that patient's mental status has been off of her baseline in the last few months.  Denies any bowel changes, urinary symptoms, blood in her stool or urine.   Chest Pain   Past Medical History:  Diagnosis Date   Acute delirium 11/18/2014   Acute encephalopathy    Acute on chronic respiratory failure with hypoxia (HCC) 09/09/2016   Acute respiratory failure with hypoxia (HCC) 11/29/2015   Anemia in chronic kidney disease 12/09/2014   Anxiety    Arthritis    Benign hypertension    Bipolar affective disorder (HCC) 12/09/2014   CAD (coronary artery disease), native coronary artery with 2 stents  11/17/2014   Charcot foot due to diabetes mellitus (HCC)    Chronic combined systolic and diastolic CHF (congestive heart failure) (HCC) 11/18/2014   Closed left ankle fracture 11/17/2014   Confusion 01/21/2015   Depression    Diabetes mellitus without complication (HCC)    End stage renal disease on dialysis (HCC)    Fracture dislocation of ankle 11/17/2014   GERD (gastroesophageal reflux disease)    Heart murmur    History of blood transfusion    History of bronchitis    History of  pneumonia    Hyperlipidemia 03/26/2015   Hypertension associated with diabetes (HCC) 11/18/2014   Hypertensive heart/renal disease with failure (HCC) 12/09/2014   Hypokalemia 11/17/2014   Multiple falls 01/21/2015   Obesity 11/17/2014   Onychomycosis 10/07/2016   Right leg DVT (HCC) 12/05/2015   SIRS (systemic inflammatory response syndrome) (HCC) 08/08/2016   Sleep apnea    Unstable angina (HCC) 12/26/2017   Ventricular tachycardia (HCC)    Past Surgical History:  Procedure Laterality Date   A/V FISTULAGRAM N/A 01/03/2020   Procedure: A/V FISTULAGRAM;  Surgeon: Nada Libman, MD;  Location: [facility];  Service: Cardiovascular;  Laterality: N/A;   A/V FISTULAGRAM Left 03/12/2021   Procedure: A/V FISTULAGRAM;  Surgeon: Nada Libman, MD;  Location: [facility];  Service: Cardiovascular;  Laterality: Left;   ABDOMINAL HYSTERECTOMY     ANGIOPLASTY Left 01/26/2020   Procedure: LEFT ARM GRAPH THROMBECTOMY WITH BALLON ANGIOPLASTY;  Surgeon: Maeola Harman, MD;  Location: [name] OR;  Service: Vascular;  Laterality: Left;   ANKLE CLOSED REDUCTION N/A 11/17/2014   Procedure: CLOSED REDUCTION ANKLE;  Surgeon: Frederico Hamman, MD;  Location: [facility] OR;  Service: Orthopedics;  Laterality: N/A;   ANKLE FUSION Left 03/16/2015   Procedure: Left Tibiocalcaneal Fusion;  Surgeon: Nadara Mustard, MD;  Location: [facility] OR;  Service: Orthopedics;  Laterality: Left;   APPLICATION OF WOUND VAC Right 08/06/2016   Procedure: APPLICATION OF PREVENA  WOUND VAC;  Surgeon: Nadara Mustard, MD;  Location: [facility] OR;  Service: Orthopedics;  Laterality: Right;   AV FISTULA PLACEMENT Left may-2016   done at [name]   CARDIAC CATHETERIZATION     2 stent    CHOLECYSTECTOMY     CORONARY PRESSURE/FFR STUDY N/A 07/31/2022   Procedure: INTRAVASCULAR PRESSURE WIRE/FFR STUDY;  Surgeon: Orbie Pyo, MD;  Location: [facility];  Service: Cardiovascular;  Laterality: N/A;   CORONARY STENT PLACEMENT      FISTULOGRAM Left 01/26/2020   Procedure: FISTULOGRAM;  Surgeon: Maeola Harman, MD;  Location: [facility] OR;  Service: Vascular;  Laterality: Left;   FOOT ARTHRODESIS Right 08/06/2016   Procedure: Right Foot Fusion Lisfranc Joint;  Surgeon: Nadara Mustard, MD;  Location: [facility] OR;  Service: Orthopedics;  Laterality: Right;   HARDWARE REMOVAL Left 03/16/2015   Procedure: Removal Hardware Left Ankle;  Surgeon: Nadara Mustard, MD;  Location: [facility] OR;  Service: Orthopedics;  Laterality: Left;   IR AV DIALY SHUNT INTRO NEEDLE/INTRACATH INITIAL W/PTA/IMG LEFT  01/05/2018   IR AV DIALY SHUNT INTRO NEEDLE/INTRACATH INITIAL W/PTA/IMG LEFT  10/18/2022   IR FLUORO GUIDE CV LINE RIGHT  10/18/2022   IR US GUIDE VASC ACCESS LEFT  01/05/2018   IR US GUIDE VASC ACCESS RIGHT  10/18/2022   LEFT HEART CATH AND CORONARY ANGIOGRAPHY N/A 12/28/2017   Procedure: LEFT HEART CATH AND CORONARY ANGIOGRAPHY;  Surgeon: Kathleene Hazel, MD;  [facility]: [facility];  Service: Cardiovascular;  Laterality: N/A;   LEFT HEART CATH AND CORONARY ANGIOGRAPHY N/A 07/31/2022   Procedure: LEFT HEART CATH AND CORONARY ANGIOGRAPHY;  Surgeon: Orbie Pyo, MD;  Location: [facility];  Service: Cardiovascular;  Laterality: N/A;   LOWER EXTREMITY ANGIOGRAPHY N/A 03/21/2021   Procedure: LOWER EXTREMITY ANGIOGRAPHY;  Surgeon: Cephus Shelling, MD;  Location: [facility];  Service: Cardiovascular;  Laterality: N/A;   ORIF ANKLE FRACTURE Left 11/20/2014   Procedure: OPEN REDUCTION INTERNAL FIXATION (ORIF) ANKLE FRACTURE;  Surgeon: Sheral Apley, MD;  Location: [facility] OR;  Service: Orthopedics;  Laterality: Left;   PERIPHERAL VASCULAR BALLOON ANGIOPLASTY  01/03/2020   Procedure: PERIPHERAL VASCULAR BALLOON ANGIOPLASTY;  Surgeon: Nada Libman, MD;  [facility]: [facility];  Service: Cardiovascular;;   PERIPHERAL VASCULAR BALLOON ANGIOPLASTY Left 03/12/2021   Procedure: PERIPHERAL VASCULAR BALLOON ANGIOPLASTY;   Surgeon: Nada Libman, MD;  [facility]: [facility];  Service: Cardiovascular;  Laterality: Left;   PERIPHERAL VASCULAR BALLOON ANGIOPLASTY  08/22/2021   Procedure: PERIPHERAL VASCULAR BALLOON ANGIOPLASTY;  Surgeon: Cephus Shelling, MD;  Location: [facility];  Service: Cardiovascular;;  Left AVF PTA   TONSILLECTOMY       Home Medications Prior to Admission medications   Medication Sig Start Date End Date Taking? Authorizing Provider  acetaminophen (TYLENOL) 325 MG tablet Take 325 mg by mouth 2 (two) times daily.    [provider]  albuterol (VENTOLIN HFA) 108 (90 Base) MCG/ACT inhaler Inhale 1-2 puffs into the lungs every 6 (six) hours as needed for wheezing or shortness of breath.    [provider]  apixaban (ELIQUIS) 5 MG TABS tablet Take 2.5 mg by mouth 2 (two) times daily with a meal.    [provider]  atorvastatin (LIPITOR) 80 MG tablet Take 1 tablet (80 mg total) by mouth at bedtime. 08/05/22   Rana Snare, DO  CALCIUM PO Take 1 tablet by mouth daily.  [provider]  cephALEXin (KEFLEX) 500 MG capsule Take 1 tablet every evening. 10/21/22   Lincoln Brigham, MD  clopidogrel (PLAVIX) 75 MG tablet Take 1 tablet (75 mg total) by mouth daily. 08/06/22   Rana Snare, DO  diclofenac Sodium (VOLTAREN) 1 % GEL Apply 4 g topically 4 (four) times daily as needed (for back pain). 08/05/22   Rana Snare, DO  empagliflozin (JARDIANCE) 10 MG TABS tablet Take 1 tablet (10 mg total) by mouth daily. 10/21/22   Alfredo Martinez, MD  ferric citrate (AURYXIA) 1 GM 210 MG(Fe) tablet Take 420 mg by mouth 2 (two) times daily with a meal.    [provider]  insulin aspart (NOVOLOG) 100 UNIT/ML injection Inject 6 Units into the skin 3 (three) times daily as needed for high blood sugar (CBG >300).    [provider]  insulin detemir (LEVEMIR FLEXTOUCH) 100 UNIT/ML FlexPen Inject 3 Units into the skin daily. Can increase after  discussing with physician 10/20/22   Alfredo Martinez, MD  isosorbide mononitrate (IMDUR) 30 MG 24 hr tablet Take 1 tablet (30 mg total) by mouth daily with breakfast. 08/06/22   Rana Snare, DO  Melatonin 10 MG TABS Take 10 mg by mouth at bedtime.    [provider]  metoprolol succinate (TOPROL-XL) 25 MG 24 hr tablet Take 1 tablet (25 mg total) by mouth daily. 08/06/22   Rana Snare, DO  multivitamin (RENA-VIT) TABS tablet Take 1 tablet by mouth at bedtime.  12/11/17   [provider]  nitroGLYCERIN (NITROSTAT) 0.4 MG SL tablet Place 0.4 mg under the tongue every 5 (five) minutes as needed for chest pain.    [provider]  pantoprazole (PROTONIX) 40 MG tablet Take 40 mg by mouth daily.    [provider]  Tiotropium Bromide Monohydrate (SPIRIVA RESPIMAT) 1.25 MCG/ACT AERS Inhale 2 puffs into the lungs every morning.    [provider]      Allergies    Patient has no known allergies.    Review of Systems   Review of Systems  Cardiovascular:  Positive for chest pain.    Physical Exam Updated Vital Signs BP (!) 198/124 (BP Location: Right Arm)   Pulse 86   Resp 18   SpO2 96%  Physical Exam Vitals and nursing note reviewed.  Constitutional:      Appearance: Normal appearance.  HENT:     Head: Normocephalic and atraumatic.     Mouth/Throat:     Mouth: Mucous membranes are moist.  Eyes:     General: No scleral icterus. Cardiovascular:     Rate and Rhythm: Normal rate and regular rhythm.     Pulses: Normal pulses.     Heart sounds: Normal heart sounds.  Pulmonary:     Effort: Pulmonary effort is normal.     Breath sounds: Normal breath sounds.  Abdominal:     General: Abdomen is flat.     Palpations: Abdomen is soft.     Tenderness: There is no abdominal tenderness.  Musculoskeletal:        General: No deformity.  Skin:    General: Skin is warm.     Findings: No rash.  Neurological:     General: No focal deficit present.      Mental Status: She is alert.  Psychiatric:        Mood and Affect: Mood normal.     ED Results / Procedures / Treatments   Labs (all labs ordered are listed,  but only abnormal results are displayed) Labs Reviewed  BASIC METABOLIC PANEL  CBC  TROPONIN I (HIGH SENSITIVITY)    EKG None  Radiology No results found.  Procedures Procedures    Medications Ordered in ED Medications - No data to display  ED Course/ Medical Decision Making/ A&P                                 Medical Decision Making Amount and/or Complexity of Data Reviewed Labs: ordered. Radiology: ordered.  Risk Prescription drug management.   This patient presents to the ED for chest pain, this involves an extensive number of treatment options, and is a complaint that carries with a high risk of complications and morbidity.  The differential diagnosis includes ACS, pericarditis, PE, pneumothorax, pneumonia, less likely dissection with essentially normal blood pressure, symmetric bilateral pulses, and no back pain.  This is not an exhaustive list.  Lab tests: I ordered and personally interpreted labs.  The pertinent results include: WBC unremarkable. Hbg unremarkable. Platelets unremarkable. Electrolytes unremarkable. BUN 27, creatinine 5.65.  Troponin elevated at 60 and 61.  Imaging studies: I ordered imaging studies, personally reviewed, interpreted imaging and agree with the radiologist's interpretations. The results include: Chest x-ray showed vascular congestion.  Problem list/ ED course/ Critical interventions/ Medical management: HPI: See above Vital signs within normal range and stable throughout visit. Laboratory/imaging studies significant for: See above. On physical examination, patient is afebrile and appears in no acute distress.  Troponin elevated at 60, 61.  Patient is a dialysis patient, BUN today is 27, creatinine is 5.65, which is elevated from her baseline.  Chest x-ray showed no  evidence of pneumonia, pneumothorax.  Presentation not consistent with acute PE, patient is not tachycardic, tachypneic, O2 sats is 100% on room air, she has been compliant with her Eliquis.  Heart score of 4 so plan to consult cardiology and admission. I have reviewed the patient home medicines and have made adjustments as needed.  Cardiac monitoring/EKG: The patient was maintained on a cardiac monitor.  I personally reviewed and interpreted the cardiac monitor which showed an underlying rhythm of: sinus rhythm.  Additional history obtained: External records from outside source obtained and reviewed including: Chart review including previous notes, labs, imaging.  Consultations obtained: I spoke to Dr. Antoine Poche cardiology.  Cardiology will follow-up with patient. I spoke to Dr. Marisue Humble medicine. He agreed to admit the patient.  Disposition Admit This chart was dictated using voice recognition software.  Despite best efforts to proofread,  errors can occur which can change the documentation meaning.          Final Clinical Impression(s) / ED Diagnoses Final diagnoses:  Chest pain, unspecified type    Rx / DC Orders ED Discharge Orders     None         Jeanelle Malling, [place] 01/15/23 1933    Coral Spikes, DO 01/21/23 [phone]

## 2023-01-16 ENCOUNTER — Other Ambulatory Visit: Payer: Self-pay

## 2023-01-16 DIAGNOSIS — I5033 Acute on chronic diastolic (congestive) heart failure: Secondary | ICD-10-CM | POA: Diagnosis not present

## 2023-01-16 DIAGNOSIS — I160 Hypertensive urgency: Secondary | ICD-10-CM

## 2023-01-16 DIAGNOSIS — N186 End stage renal disease: Secondary | ICD-10-CM | POA: Diagnosis not present

## 2023-01-16 DIAGNOSIS — R079 Chest pain, unspecified: Secondary | ICD-10-CM | POA: Diagnosis not present

## 2023-01-16 LAB — BLOOD GAS, ARTERIAL
Acid-Base Excess: 0.3 mmol/L (ref 0.0–2.0)
Bicarbonate: 24.6 mmol/L (ref 20.0–28.0)
Drawn by: 59094
O2 Saturation: 98.7 %
Patient temperature: 36.3
pCO2 arterial: 37 mmHg (ref 32–48)
pH, Arterial: 7.43 (ref 7.35–7.45)
pO2, Arterial: 104 mmHg (ref 83–108)

## 2023-01-16 LAB — RENAL FUNCTION PANEL
Albumin: 3.9 g/dL (ref 3.5–5.0)
Anion gap: 20 — ABNORMAL HIGH (ref 5–15)
BUN: 38 mg/dL — ABNORMAL HIGH (ref 8–23)
CO2: 25 mmol/L (ref 22–32)
Calcium: 9.7 mg/dL (ref 8.9–10.3)
Chloride: 92 mmol/L — ABNORMAL LOW (ref 98–111)
Creatinine, Ser: 6.57 mg/dL — ABNORMAL HIGH (ref 0.44–1.00)
GFR, Estimated: 6 mL/min — ABNORMAL LOW (ref >60)
Glucose, Bld: 163 mg/dL — ABNORMAL HIGH (ref 70–99)
Phosphorus: 7.6 mg/dL — ABNORMAL HIGH (ref 2.5–4.6)
Potassium: 4.9 mmol/L (ref 3.5–5.1)
Sodium: 137 mmol/L (ref 135–145)

## 2023-01-16 LAB — MAGNESIUM: Magnesium: 2.6 mg/dL — ABNORMAL HIGH (ref 1.7–2.4)

## 2023-01-16 LAB — GLUCOSE, CAPILLARY: Glucose-Capillary: 135 mg/dL — ABNORMAL HIGH (ref 70–99)

## 2023-01-16 LAB — AMMONIA: Ammonia: 40 umol/L — ABNORMAL HIGH (ref 9–35)

## 2023-01-16 LAB — HEPATITIS B SURFACE ANTIGEN: Hepatitis B Surface Ag: NONREACTIVE

## 2023-01-16 MED ORDER — TRIMETHOBENZAMIDE HCL 100 MG/ML IM SOLN
200.0000 mg | Freq: Four times a day (QID) | INTRAMUSCULAR | Status: DC | PRN
  Administered 2023-01-16: 200 mg via INTRAMUSCULAR
  Filled 2023-01-16 (×2): qty 2

## 2023-01-16 MED ORDER — AMLODIPINE BESYLATE 5 MG PO TABS
5.0000 mg | ORAL_TABLET | Freq: Every day | ORAL | Status: DC
  Administered 2023-01-16 – 2023-01-18 (×3): 5 mg via ORAL
  Filled 2023-01-16 (×3): qty 1

## 2023-01-16 MED ORDER — CHLORHEXIDINE GLUCONATE CLOTH 2 % EX PADS
6.0000 | MEDICATED_PAD | Freq: Every day | CUTANEOUS | Status: DC
  Administered 2023-01-16 – 2023-01-18 (×3): 6 via TOPICAL

## 2023-01-16 MED ORDER — CHLORHEXIDINE GLUCONATE CLOTH 2 % EX PADS: 6.0000 | MEDICATED_PAD | Freq: Every day | CUTANEOUS | Status: DC

## 2023-01-16 MED ORDER — NALOXONE HCL 0.4 MG/ML IJ SOLN
INTRAMUSCULAR | Status: AC
  Administered 2023-01-16: 0.4 mg
  Filled 2023-01-16: qty 1

## 2023-01-16 MED ORDER — HEPARIN SODIUM (PORCINE) 1000 UNIT/ML IJ SOLN
INTRAMUSCULAR | Status: AC
  Administered 2023-01-16: 3800 [IU]
  Filled 2023-01-16: qty 1

## 2023-01-16 MED ORDER — NALOXONE HCL 0.4 MG/ML IJ SOLN: 0.4000 mg | INTRAMUSCULAR | Status: DC | PRN

## 2023-01-16 MED ORDER — HYDRALAZINE HCL 10 MG PO TABS: 10.0000 mg | ORAL_TABLET | Freq: Once | ORAL | Status: DC

## 2023-01-16 MED ORDER — MAGNESIUM SULFATE IN D5W 1-5 GM/100ML-% IV SOLN
1.0000 g | Freq: Once | INTRAVENOUS | Status: DC
  Filled 2023-01-16: qty 100

## 2023-01-16 NOTE — Progress Notes (Signed)
Pt appeared to have a 30 plus run of vfib/vtach at approx 1846---pt was briefly symptomatic as in a decreased LOC---went back to her neuroi baseline within 2 minutes---uf turned off--pt put in trendelenberg---bps did not drop with this issue---Dr. Marisue Humble called and given a full status peport---to take off tx if this were to happen again---also notifieed tele to have them pull it up and document---

## 2023-01-16 NOTE — Progress Notes (Signed)
Saw patient on HD due to reports of recurrent polymorphic VT. Tele reviewed, at present I am only able to see the first episode which was ~30 beats. By HD nursing reports, there were two other runs of ~10 beats and treatment was discontinued. I saw her on the HD unit just after treatment was discontinued.. Patient tells me she feels "fine" though by nursing report she had some decreased responsiveness  On review of the first episode, there are changing morphologies consistent with Torsades de pointes. Ordered an add-on mag level to pre-HD labs and 1g of IV magnesium. However, pre-HD mag level returned slightly high at 2.6 so d/c'd IV mag due to risk of toxicity with her ESRD. Discussed with Dr. Marisue Humble, nephrology who felt that VT may be related to her being on HD. If arrhythmia recurs now that she is off of HD, will reach back out to cardiology as we may  be looking at an ischemia-mediated arrhythmia.  - repeat EKG once on the floor   J Dorothyann Gibbs, MD

## 2023-01-16 NOTE — Hospital Course (Addendum)
Robin [name] is a 72 y.o.female with a history of NSTEMI, HTN, T2DM, CAD (2 stents 07/2022), ESRD (MWF dialysis), bipolar who was admitted to the [facility] Medicine Teaching Service at [facility] for chest pain. Her hospital course is detailed below:  Chest pain Presented with chest pain x2 days. Has been sick with N/V/D and had been vomiting her home imdur and metoprolol. Patient had shortened dialysis sessions on Monday and Wednesday due to chest pain and incontinence. Cardiology was consulted. They believe chest pain is due to hypertensive urgency with possible demand ischemia. They recommended adding amlodipine 5mg  to antihypertensive regimen. Imdur was continued at current dose as hypertension improved with dialysis. Patient had recurring runs of Vtach for 20-30 beats while in and out of dialysis. She also had an episode of torsades. Electrolytes were optimized. However, patient continued to have runs of Vtach. Patient and family decided DNR.   N/V/D Likely viral or due to uremia from shortened dialysis sessions. Has improved since being here.  ESRD MWF dialysis pt. Has not received full dialysis treatments recently for various reasons (incontinence, chest pain). Dialyzed for 2 sessions however had to stop second session due to [place].   AMS Episode of somnolence on 8/23 AM, pt intermittently responsive to noxious stimuli. No lab abnormalities or EKG changes. Most likely due to uremia from shortened dialysis sessions. Pt returned to mental status baseline shortly after rapid response team evaluation. Patient's daughter informed patient was on abilify at home. Was given one dose on day of discharge.   Code Status change  Discussed poor prognosis with multiple runs of V. tach and prolonged QTc with patient and patient's daughter.  Patient's daughter initially hesitant as living will had said full code and full treatment however now that prognosis has changed patient and patient's daughter agree with DNR.  As  patient has improved will be discharged.  Follow-up with outpatient palliative care services.  Will continue dialysis until unable to.  Other chronic conditions were medically managed with home medications and formulary alternatives as necessary (DM2, HTN, Bipolar disorder, HLD)  PCP Follow-up Recommendations: Review blood pressure medications- added amlodipine, continued imdur. Daughter informed on day of discharge patient is also on lisinopril. Consider titrating medications and adding lisinopril back to her regimen.  Patient has prolonged Qtc, avoid wtc prolonging medications.  Patient is now DNR and working with outpatient palliative medicine.

## 2023-01-16 NOTE — Consult Note (Signed)
Cardiology Consultation   [name] MRN: [id number]; DOB: 10-26-1950  Admit date: 01/15/2023 Date of Consult: 01/16/2023  PCP:  [facility] Providers Cardiologist:  Nicki Guadalajara, MD        Patient Profile:   Robin [name] is a 72 y.o. female with a hx of ESRD on dialysis, paroxysmal A-fib, remote PE/DVT, CAD s/p PCI to proximal circumflex in 2016 with recent NSTEMI with moderate nonobstructive disease 07/31/22, lower extremity necrotizing infctions s/p amputations, combined chronic systolic and diastolic heart failure, dementia, bipolar, COPD, T2DM, PVD s/p LLE SFA stent 2023, prolonged QT interval, HTN, and HLD.    Patient is being seen 01/16/2023 for the evaluation of chest pain and elevated troponin at the request of [facility] Medicine Residency team.  History of Present Illness:   Robin [name] presented to the ED on 8/22 and reported symptoms of chest pain located centrally and left side. Pain started the night before on 8/21. Family added that she had been sick with GI symptoms including nausea with emesis and lack of appetite x1 week. It sounds like she was unable to complete dialysis on Monday of this week. Per family (according to H&P note), there has also been some increase in confusion over recent months.   In the ED, patient found with evidence of vascular congestion on CXR. EKG with non-specific TWI in leads II and III. HS troponin checked and found to be minimally elevated and flat at 60->61. She was admitted for further workup of her chest pain. Repeat HS troponin 66->62. Cardiology consulted for further evaluation.  On the morning of 8/23, patient found to be somnolent and rapid response triggered. An ECG was checked and found stable. Blood glucose 135 and ABG within normal limits. Within about 15 minutes of rapid response trigger, patient became alert and talkative without focal neurological deficits.  On my exam,  patient is sleepy but confirms above history. She denies any recurrence of chest pain.    Past Medical History:  Diagnosis Date   Acute delirium 11/18/2014   Acute encephalopathy    Acute on chronic respiratory failure with hypoxia (HCC) 09/09/2016   Acute respiratory failure with hypoxia (HCC) 11/29/2015   Anemia in chronic kidney disease 12/09/2014   Anxiety    Arthritis    Benign hypertension    Bipolar affective disorder (HCC) 12/09/2014   CAD (coronary artery disease), native coronary artery with 2 stents  11/17/2014   Charcot foot due to diabetes mellitus (HCC)    Chronic combined systolic and diastolic CHF (congestive heart failure) (HCC) 11/18/2014   Closed left ankle fracture 11/17/2014   Confusion 01/21/2015   Depression    Diabetes mellitus without complication (HCC)    End stage renal disease on dialysis [name])    Fracture dislocation of ankle 11/17/2014   GERD (gastroesophageal reflux disease)    Heart murmur    History of blood transfusion    History of bronchitis    History of pneumonia    Hyperlipidemia 03/26/2015   Hypertension associated with diabetes (HCC) 11/18/2014   Hypertensive heart/renal disease with failure (HCC) 12/09/2014   Hypokalemia 11/17/2014   Multiple falls 01/21/2015   Nausea & vomiting 01/15/2023   Obesity 11/17/2014   Onychomycosis 10/07/2016   Right leg DVT (HCC) 12/05/2015   SIRS (systemic inflammatory response syndrome) (HCC) 08/08/2016   Sleep apnea    Unstable angina (HCC) 12/26/2017   Ventricular tachycardia (HCC)  Past Surgical History:  Procedure Laterality Date   A/V FISTULAGRAM N/A 01/03/2020   Procedure: A/V FISTULAGRAM;  Surgeon: Nada Libman, MD;  [facility]: [facility];  Service: Cardiovascular;  Laterality: N/A;   A/V FISTULAGRAM Left 03/12/2021   Procedure: A/V FISTULAGRAM;  Surgeon: Nada Libman, MD;  Location: [facility];  Service: Cardiovascular;  Laterality: Left;   ABDOMINAL HYSTERECTOMY     ANGIOPLASTY Left  01/26/2020   Procedure: LEFT ARM GRAPH THROMBECTOMY WITH BALLON ANGIOPLASTY;  Surgeon: Maeola Harman, MD;  Location: [facility] OR;  Service: Vascular;  Laterality: Left;   ANKLE CLOSED REDUCTION N/A 11/17/2014   Procedure: CLOSED REDUCTION ANKLE;  Surgeon: Frederico Hamman, MD;  Location: [facility] OR;  Service: Orthopedics;  Laterality: N/A;   ANKLE FUSION Left 03/16/2015   Procedure: Left Tibiocalcaneal Fusion;  Surgeon: Nadara Mustard, MD;  Location: [facility] OR;  Service: Orthopedics;  Laterality: Left;   APPLICATION OF WOUND VAC Right 08/06/2016   Procedure: APPLICATION OF PREVENA WOUND VAC;  Surgeon: Nadara Mustard, MD;  Location: [facility] OR;  Service: Orthopedics;  Laterality: Right;   AV FISTULA PLACEMENT Left may-2016   done at [facility]   CARDIAC CATHETERIZATION     2 stent    CHOLECYSTECTOMY     CORONARY PRESSURE/FFR STUDY N/A 07/31/2022   Procedure: INTRAVASCULAR PRESSURE WIRE/FFR STUDY;  Surgeon: Orbie Pyo, MD;  [facility];  Service: Cardiovascular;  Laterality: N/A;   CORONARY STENT PLACEMENT     FISTULOGRAM Left 01/26/2020   Procedure: FISTULOGRAM;  Surgeon: Maeola Harman, MD;  Location: [name] OR;  Service: Vascular;  Laterality: Left;   FOOT ARTHRODESIS Right 08/06/2016   Procedure: Right Foot Fusion Lisfranc Joint;  Surgeon: Nadara Mustard, MD;  Location: [name] OR;  Service: Orthopedics;  Laterality: Right;   HARDWARE REMOVAL Left 03/16/2015   Procedure: Removal Hardware Left Ankle;  Surgeon: Nadara Mustard, MD;  Location: [name] OR;  Service: Orthopedics;  Laterality: Left;   IR AV DIALY SHUNT INTRO NEEDLE/INTRACATH INITIAL W/PTA/IMG LEFT  01/05/2018   IR AV DIALY SHUNT INTRO NEEDLE/INTRACATH INITIAL W/PTA/IMG LEFT  10/18/2022   IR FLUORO GUIDE CV LINE RIGHT  10/18/2022   IR US GUIDE VASC ACCESS LEFT  01/05/2018   IR US GUIDE VASC ACCESS RIGHT  10/18/2022   LEFT HEART CATH AND CORONARY ANGIOGRAPHY N/A 12/28/2017   Procedure: LEFT HEART CATH AND CORONARY ANGIOGRAPHY;  Surgeon:  Kathleene Hazel, MD;  [facility];  Service: Cardiovascular;  Laterality: N/A;   LEFT HEART CATH AND CORONARY ANGIOGRAPHY N/A 07/31/2022   Procedure: LEFT HEART CATH AND CORONARY ANGIOGRAPHY;  Surgeon: Orbie Pyo, MD;  [facility];  Service: Cardiovascular;  Laterality: N/A;   LOWER EXTREMITY ANGIOGRAPHY N/A 03/21/2021   Procedure: LOWER EXTREMITY ANGIOGRAPHY;  Surgeon: Cephus Shelling, MD;  [facility];  Service: Cardiovascular;  Laterality: N/A;   ORIF ANKLE FRACTURE Left 11/20/2014   Procedure: OPEN REDUCTION INTERNAL FIXATION (ORIF) ANKLE FRACTURE;  Surgeon: Sheral Apley, MD;  Location: [facility] OR;  Service: Orthopedics;  Laterality: Left;   PERIPHERAL VASCULAR BALLOON ANGIOPLASTY  01/03/2020   Procedure: PERIPHERAL VASCULAR BALLOON ANGIOPLASTY;  Surgeon: Nada Libman, MD;  [facility];  Service: Cardiovascular;;   PERIPHERAL VASCULAR BALLOON ANGIOPLASTY Left 03/12/2021   Procedure: PERIPHERAL VASCULAR BALLOON ANGIOPLASTY;  Surgeon: Nada Libman, MD;  [facility];  Service: Cardiovascular;  Laterality:  Left;   PERIPHERAL VASCULAR BALLOON ANGIOPLASTY  08/22/2021   Procedure: PERIPHERAL VASCULAR BALLOON ANGIOPLASTY;  Surgeon: Cephus Shelling, MD;  [facility];  Service: Cardiovascular;;  Left AVF PTA   TONSILLECTOMY       Home Medications:  Prior to Admission medications   Medication Sig Start Date End Date Taking? Authorizing Provider  acetaminophen (TYLENOL) 325 MG tablet Take 325 mg by mouth 2 (two) times daily.   Yes [provider]  albuterol (VENTOLIN HFA) 108 (90 Base) MCG/ACT inhaler Inhale 1-2 puffs into the lungs every 6 (six) hours as needed for wheezing or shortness of breath.   Yes [provider]  apixaban (ELIQUIS) 5 MG TABS tablet Take 2.5 mg by mouth 2 (two) times daily with a meal.   Yes [provider]  atorvastatin  (LIPITOR) 80 MG tablet Take 1 tablet (80 mg total) by mouth at bedtime. 08/05/22  Yes Rana Snare, DO  CALCIUM PO Take 1 tablet by mouth daily.   Yes [provider]  clopidogrel (PLAVIX) 75 MG tablet Take 1 tablet (75 mg total) by mouth daily. 08/06/22  Yes Rana Snare, DO  diclofenac Sodium (VOLTAREN) 1 % GEL Apply 4 g topically 4 (four) times daily as needed (for back pain). 08/05/22  Yes Rana Snare, DO  ferric citrate (AURYXIA) 1 GM 210 MG(Fe) tablet Take 420 mg by mouth 2 (two) times daily with a meal.   Yes [provider]  isosorbide mononitrate (IMDUR) 30 MG 24 hr tablet Take 1 tablet (30 mg total) by mouth daily with breakfast. 08/06/22  Yes Rana Snare, DO  Melatonin 10 MG TABS Take 10 mg by mouth at bedtime.   Yes [provider]  metoprolol succinate (TOPROL-XL) 25 MG 24 hr tablet Take 1 tablet (25 mg total) by mouth daily. 08/06/22  Yes Rana Snare, DO  multivitamin (RENA-VIT) TABS tablet Take 1 tablet by mouth at bedtime.  12/11/17  Yes [provider]  nitroGLYCERIN (NITROSTAT) 0.4 MG SL tablet Place 0.4 mg under the tongue every 5 (five) minutes as needed for chest pain.   Yes [provider]  pantoprazole (PROTONIX) 40 MG tablet Take 40 mg by mouth daily.   Yes [provider]  Tiotropium Bromide Monohydrate (SPIRIVA RESPIMAT) 1.25 MCG/ACT AERS Inhale 2 puffs into the lungs every morning.   Yes [provider]  gabapentin (NEURONTIN) 100 MG capsule Take 100 mg by mouth 2 (two) times daily as needed (for neuropathic pain). Patient not taking: Reported on 01/15/2023 01/01/23   [provider]  insulin aspart (NOVOLOG) 100 UNIT/ML injection Inject 6 Units into the skin 3 (three) times daily as needed for high blood sugar (CBG >300). Patient not taking: Reported on 01/15/2023    [provider]  insulin detemir (LEVEMIR FLEXTOUCH) 100 UNIT/ML FlexPen Inject 3 Units into the skin daily. Can increase  after discussing with physician Patient not taking: Reported on 01/15/2023 10/20/22   Alfredo Martinez, MD    Inpatient Medications: Scheduled Meds:  apixaban  2.5 mg Oral BID WC   atorvastatin  80 mg Oral QHS   calcium carbonate  1,250 mg Oral BID WC   Chlorhexidine Gluconate Cloth  6 each Topical Daily   clopidogrel  75 mg Oral Daily   isosorbide mononitrate  30 mg Oral Q breakfast   melatonin  10 mg Oral QHS   metoprolol succinate  25 mg Oral Daily   multivitamin  1 tablet Oral QHS  pantoprazole  40 mg Oral Daily   umeclidinium bromide  1 puff Inhalation Daily   Continuous Infusions:  PRN Meds: naloxone, nitroGLYCERIN, trimethobenzamide  Allergies:   No Known Allergies  Social History:   Social History   Socioeconomic History   Marital status: Divorced    Spouse name: Not on file   Number of children: Not on file   Years of education: Not on file   Highest education level: Not on file  Occupational History   Not on file  Tobacco Use   Smoking status: Every Day    Current packs/day: 1.00    Types: Cigarettes   Smokeless tobacco: Never  Vaping Use   Vaping status: Never Used  Substance and Sexual Activity   Alcohol use: No   Drug use: No   Sexual activity: Never  Other Topics Concern   Not on file  Social History Narrative   Not on file   Social Determinants of Health   Financial Resource Strain: Not on file  Food Insecurity: No Food Insecurity (01/15/2023)   Hunger Vital Sign    Worried About Running Out of Food in the Last Year: Never true    Ran Out of Food in the Last Year: Never true  Transportation Needs: No Transportation Needs (01/15/2023)   PRAPARE - Transportation    Lack of Transportation (Medical): No    Lack of Transportation (Non-Medical): No  Physical Activity: Not on file  Stress: Not on file  Social Connections: Not on file  Intimate Partner Violence: Not At Risk (01/15/2023)   Humiliation, Afraid, Rape, and Kick questionnaire    Fear of  Current or Ex-Partner: No    Emotionally Abused: No    Physically Abused: No    Sexually Abused: No    Family History:    Family History  Family history unknown: Yes     ROS:  Please see the history of present illness.   All other ROS reviewed and negative.     Physical Exam/Data:   Vitals:   01/16/23 0030 01/16/23 0537 01/16/23 0805 01/16/23 0830  BP: (!) 176/83  (!) 188/136 (!) 195/117  Pulse: (!) 56  (!) 55 (!) 57  Resp:   15 19  Temp: (!) 96.3 F (35.7 C) (!) 97.3 F (36.3 C)    TempSrc: Axillary Temporal    SpO2: 99%  96% 100%  Weight:  54.2 kg    Height:        Intake/Output Summary (Last 24 hours) at 01/16/2023 1302 Last data filed at 01/15/2023 2200 Gross per 24 hour  Intake 240 ml  Output --  Net 240 ml      01/16/2023    5:37 AM 01/15/2023   10:53 PM 01/15/2023   12:44 PM  Last 3 Weights  Weight (lbs) 119 lb 7.8 oz 119 lb 14.4 oz 134 lb 14.7 oz  Weight (kg) 54.2 kg 54.386 kg 61.2 kg     Body mass index is 20.51 kg/m.  General:  Well nourished, well developed, in no acute distress but sleepy. HEENT: normal Neck: no JVD Vascular: No carotid bruits; Distal pulses 2+ bilaterally Cardiac:  normal S1, S2; RRR; no murmur Lungs:  clear to auscultation bilaterally, no wheezing, rhonchi or rales  Abd: soft, nontender, no hepatomegaly  Ext: no edema Musculoskeletal:  No deformities, BUE and BLE strength normal and equal Skin: warm and dry  Neuro:  CNs 2-12 intact, no focal abnormalities noted Psych:  Mild confusion  EKG:  Multiple  EKGs from current admission as well as previous were personally reviewed: Tracings from this admission are not acutely changed from previous. Patient has multiple tracings from the last year with non-specific TWI in both inferior and lateral leads. Current ECG without focal ST segment changes.  Telemetry:  Telemetry was personally reviewed and demonstrates:  sinus rhythm  Relevant CV Studies:   07/31/22 LHC    Ost LAD to Mid  LAD lesion is 60% stenosed.   Ost Cx to Prox Cx lesion is 65% stenosed.   Prox Cx to Mid Cx lesion is 20% stenosed.   Prox RCA lesion is 20% stenosed.   Ost Ramus to Ramus lesion is 20% stenosed.   Ost 1st Diag lesion is 50% stenosed.   Ost 2nd Diag lesion is 70% stenosed.   Mid RCA lesion is 60% stenosed.   Mid LAD lesion is 40% stenosed.   1.  Patent ramus stent with minimal in-stent restenosis. 2.  Moderate proximal to mid LAD lesion with an RFR of 0.95. 3.  Moderate ostial left circumflex lesion with an RFR of 1.0. 4.  Moderate mid right coronary artery lesion with an RFR of 0.92. 5.  LVEDP of 6 mmHg.  Diagnostic Dominance: Co-dominant   07/29/22 TTE  IMPRESSIONS     1. Left ventricular ejection fraction, by estimation, is 45 to 50%. The  left ventricle has mildly decreased function. The left ventricle  demonstrates global hypokinesis. There is mild concentric left ventricular  hypertrophy. Left ventricular diastolic  parameters are consistent with Grade II diastolic dysfunction  (pseudonormalization).   2. Right ventricular systolic function is mildly reduced. The right  ventricular size is mildly enlarged. There is mildly elevated pulmonary  artery systolic pressure. The estimated right ventricular systolic  pressure is 41.0 mmHg.   3. The mitral valve is grossly normal. Trivial mitral valve  regurgitation. No evidence of mitral stenosis.   4. The aortic valve is tricuspid. Aortic valve regurgitation is not  visualized. Aortic valve sclerosis is present, with no evidence of aortic  valve stenosis.   5. The inferior vena cava is dilated in size with <50% respiratory  variability, suggesting right atrial pressure of 15 mmHg.   FINDINGS   Left Ventricle: Left ventricular ejection fraction, by estimation, is 45  to 50%. The left ventricle has mildly decreased function. The left  ventricle demonstrates global hypokinesis. The left ventricular internal  cavity size was  normal in size. There is   mild concentric left ventricular hypertrophy. Left ventricular diastolic  parameters are consistent with Grade II diastolic dysfunction  (pseudonormalization).   Right Ventricle: The right ventricular size is mildly enlarged. No  increase in right ventricular wall thickness. Right ventricular systolic  function is mildly reduced. There is mildly elevated pulmonary artery  systolic pressure. The tricuspid regurgitant   velocity is 2.55 m/s, and with an assumed right atrial pressure of 15  mmHg, the estimated right ventricular systolic pressure is 41.0 mmHg.   Left Atrium: Left atrial size was normal in size.   Right Atrium: Right atrial size was normal in size.   Pericardium: There is no evidence of pericardial effusion.   Mitral Valve: The mitral valve is grossly normal. Trivial mitral valve  regurgitation. No evidence of mitral valve stenosis.   Tricuspid Valve: The tricuspid valve is grossly normal. Tricuspid valve  regurgitation is trivial. No evidence of tricuspid stenosis.   Aortic Valve: The aortic valve is tricuspid. Aortic valve regurgitation is  not visualized. Aortic valve  sclerosis is present, with no evidence of  aortic valve stenosis.   Pulmonic Valve: The pulmonic valve was grossly normal. Pulmonic valve  regurgitation is trivial. No evidence of pulmonic stenosis.   Aorta: The aortic root and ascending aorta are structurally normal, with  no evidence of dilitation.   Venous: The inferior vena cava is dilated in size with less than 50%  respiratory variability, suggesting right atrial pressure of 15 mmHg.   IAS/Shunts: The atrial septum is grossly normal.   Laboratory Data:  High Sensitivity Troponin:   Recent Labs  Lab 01/15/23 1347 01/15/23 1606 01/15/23 2055 01/15/23 2216  TROPONINIHS 60* 61* 66* 62*     Chemistry Recent Labs  Lab 01/15/23 1223 01/16/23 0357  NA 134* 137  K 4.9 4.9  CL 92* 92*  CO2 20* 25  GLUCOSE  193* 163*  BUN 27* 38*  CREATININE 5.65* 6.57*  CALCIUM 9.5 9.7  GFRNONAA 8* 6*  ANIONGAP 22* 20*    Recent Labs  Lab 01/15/23 1347 01/16/23 0357  PROT 7.7  --   ALBUMIN 3.6 3.9  AST 31  --   ALT 28  --   ALKPHOS 148*  --   BILITOT 1.4*  --    Lipids No results for input(s): "CHOL", "TRIG", "HDL", "LABVLDL", "LDLCALC", "CHOLHDL" in the last 168 hours.  Hematology Recent Labs  Lab 01/15/23 1223  WBC 6.3  RBC 4.38  HGB 12.9  HCT 42.5  MCV 97.0  MCH 29.5  MCHC 30.4  RDW 16.8*  PLT 197   Thyroid No results for input(s): "TSH", "FREET4" in the last 168 hours.  BNP Recent Labs  Lab 01/15/23 2055  BNP >4,500.0*    DDimer No results for input(s): "DDIMER" in the last 168 hours.   Radiology/Studies:  DG Chest 2 View  Result Date: 01/15/2023 CLINICAL DATA:  Chest pain EXAM: CHEST - 2 VIEW COMPARISON:  12/12/2022 x-ray FINDINGS: Enlarged cardiopericardial silhouette. Calcified aorta. Central vascular congestion. No pneumothorax, effusion or edema. Stable right IJ double-lumen catheter with tip along the right atrium. Left axillary vascular stent. The nodular density seen on prior x-ray in the left upper lung is not well seen on the current examination. Overlapping cardiac leads. IMPRESSION: Enlarged heart with vascular congestion.  Right IJ line. Electronically Signed   By: Karen Kays M.D.   On: 01/15/2023 12:42     Assessment and Plan:   Atypical chest pain Hx CAD with prior PCI to ramus  Patient admitted after presenting with atypical chest pain the setting of recent GI upset with nausea and vomiting. HS troponin 60->61->66->62. Patient admitted in March of this year with suspected NSTEMI and underwent LHC that found patent ramus stent with minimal ISR, moderate proximal to mid LAD lesions with RFR 0.95, moderate ostial left Cx lesion with RFR 1.0, moderate mid RCA with an RFR 0.92.   ECG without acute ischemic changes. Multiple EKGs from current admission as well as  previous were personally reviewed: Tracings from this admission are not acutely changed from previous. Patient has multiple tracings from the last year with non-specific TWI in both inferior and lateral leads. Minimal troponin elevation without significant delta is not consistent with ACS. Given her known CAD history and ESRD, would expect a much higher HS troponin peak if she was truly having myocardial ischemia. This is much more consistent with demand ischemia in an ESRD patient. I think her persistent hypertension is largely driving her symptoms and would recommend increased blood pressure control.  Add Amlodipine 5mg . Continue Plavix 75mg  daily, Imdur 30mg  daily, Toprol XL 25mg  daily, Lipitor 80mg  daily.   Hypertension  Patient profoundly hypertensive this admission. Suspect this to be primary source of chest pain and elevated troponin. Continue Toprol XL 25mg . Add amlodipine 5mg  daily.  Combined systolic and diastolic CHF  Most recent TTE from March of this year revealed 45-50% with global hypokinesis and grade II DD.   Continue Toprol XL 25mg . Volume management per HD  Paroxysmal afib  No evidence of recurrent afib this admission. Continue low dose Eliquis.   Hyperlipidemia  LDL 40 as of March 2024. Continue Lipitor 80mg .    Per primary team:  ESRD   Risk Assessment/Risk Scores:        New York Heart Association (NYHA) Functional Class Limited HPI from patient. Appears to be NYHA Class II  CHA2DS2-VASc Score = 6   This indicates a 9.7% annual risk of stroke. The patient's score is based upon: CHF History: 1 HTN History: 1 Diabetes History: 1 Stroke History: 0 Vascular Disease History: 1 Age Score: 1 Gender Score: 1         For questions or updates, please contact [facility] Please consult [email or web address] for contact info under    Signed, Perlie Gold, PA-C  01/16/2023 1:02 PM

## 2023-01-16 NOTE — Assessment & Plan Note (Signed)
Bps elevated to 170s-190s systolic since admission. Has not taken antihypertensives for 1 week in the setting of N/V. Got hydralazine 10mg  in the ED. - Continue at home BP medications - imdur 30mg , metoprolol 25mg  - Dialysis today

## 2023-01-16 NOTE — Progress Notes (Signed)
Robin [name] regurgitated morning meds with white pill fragments seen in vomitus. Daughter stated Robin [name] does this at home and has not been able to keep any food down. Medical team notified.

## 2023-01-16 NOTE — Assessment & Plan Note (Signed)
 Patient did have some confusion on exam, and son states that patient's mental status will wax and wane.  States this is not uncommon if patient is not taking her medications correctly -Delirium precautions

## 2023-01-16 NOTE — Progress Notes (Signed)
Awake, alert, amb to BR with steady gait x 1 assist. Oriented to person and place. Daughter stating this is baseline. CT cancelled by provider.

## 2023-01-16 NOTE — Significant Event (Signed)
NS iv at [facility] started. Opens eyes to pain, MAE equal but still somnolent. RRT called, charge RT notified.

## 2023-01-16 NOTE — Consult Note (Addendum)
Renal Service Consult Note [facility]  Robin [name] 01/16/2023 [name], MD Requesting Physician: Dr. Linwood Dibbles  Reason for Consult: ESRD pt w/ chest pain and SOB HPI: The patient is a 72 y.o. year-old w/ PMH as below who presented to ED for SOB and chest pain. Pt is an esrd patient on HD MWF. In ED trop were borderline up, EKG showed T wave inversion in II and III. No high HR or RR, afeb , no ^wbc.  CXR showed vasc congestion.  Pt was admitted for chest pain. Cardiology consulted, low suspicion for ACS. Last echo showed EF 45-50%.  We are asked to see for dialysis.   Pt seen in room.  Pt is w/o current c/o's.    ROS - no joint pain, no HA, no blurry vision, no rash, no diarrhea, no nausea/ vomiting   Past Medical History  Past Medical History:  Diagnosis Date   Acute delirium 11/18/2014   Acute encephalopathy    Acute on chronic respiratory failure with hypoxia (HCC) 09/09/2016   Acute respiratory failure with hypoxia (HCC) 11/29/2015   Anemia in chronic kidney disease 12/09/2014   Anxiety    Arthritis    Benign hypertension    Bipolar affective disorder (HCC) 12/09/2014   CAD (coronary artery disease), native coronary artery with 2 stents  11/17/2014   Charcot foot due to diabetes mellitus (HCC)    Chronic combined systolic and diastolic CHF (congestive heart failure) (HCC) 11/18/2014   Closed left ankle fracture 11/17/2014   Confusion 01/21/2015   Depression    Diabetes mellitus without complication (HCC)    End stage renal disease on dialysis (HCC)    Fracture dislocation of ankle 11/17/2014   GERD (gastroesophageal reflux disease)    Heart murmur    History of blood transfusion    History of bronchitis    History of pneumonia    Hyperlipidemia 03/26/2015   Hypertension associated with diabetes (HCC) 11/18/2014   Hypertensive heart/renal disease with failure (HCC) 12/09/2014   Hypokalemia 11/17/2014   Multiple falls 01/21/2015   Nausea & vomiting 01/15/2023    Obesity 11/17/2014   Onychomycosis 10/07/2016   Right leg DVT (HCC) 12/05/2015   SIRS (systemic inflammatory response syndrome) (HCC) 08/08/2016   Sleep apnea    Unstable angina (HCC) 12/26/2017   Ventricular tachycardia (HCC)    Past Surgical History  Past Surgical History:  Procedure Laterality Date   A/V FISTULAGRAM N/A 01/03/2020   Procedure: A/V FISTULAGRAM;  Surgeon: Nada Libman, MD;  [facility]: [facility];  Service: Cardiovascular;  Laterality: N/A;   A/V FISTULAGRAM Left 03/12/2021   Procedure: A/V FISTULAGRAM;  Surgeon: Nada Libman, MD;  Location: [facility];  Service: Cardiovascular;  Laterality: Left;   ABDOMINAL HYSTERECTOMY     ANGIOPLASTY Left 01/26/2020   Procedure: LEFT ARM GRAPH THROMBECTOMY WITH BALLON ANGIOPLASTY;  Surgeon: Maeola Harman, MD;  Location: [facility] OR;  Service: Vascular;  Laterality: Left;   ANKLE CLOSED REDUCTION N/A 11/17/2014   Procedure: CLOSED REDUCTION ANKLE;  Surgeon: Frederico Hamman, MD;  Location: [facility] OR;  Service: Orthopedics;  Laterality: N/A;   ANKLE FUSION Left 03/16/2015   Procedure: Left Tibiocalcaneal Fusion;  Surgeon: Nadara Mustard, MD;  Location: [facility] OR;  Service: Orthopedics;  Laterality: Left;   APPLICATION OF WOUND VAC Right 08/06/2016   Procedure: APPLICATION OF PREVENA WOUND VAC;  Surgeon: Nadara Mustard, MD;  Location: [facility] OR;  Service: Orthopedics;  Laterality: Right;  AV FISTULA PLACEMENT Left may-2016   done at [facility]   CARDIAC CATHETERIZATION     2 stent    CHOLECYSTECTOMY     CORONARY PRESSURE/FFR STUDY N/A 07/31/2022   Procedure: INTRAVASCULAR PRESSURE WIRE/FFR STUDY;  Surgeon: Orbie Pyo, MD;  [facility]: [facility];  Service: Cardiovascular;  Laterality: N/A;   CORONARY STENT PLACEMENT     FISTULOGRAM Left 01/26/2020   Procedure: FISTULOGRAM;  Surgeon: Maeola Harman, MD;  Location: [facility] OR;  Service: Vascular;  Laterality: Left;   FOOT ARTHRODESIS Right 08/06/2016   Procedure:  Right Foot Fusion Lisfranc Joint;  Surgeon: Nadara Mustard, MD;  Location: [facility] OR;  Service: Orthopedics;  Laterality: Right;   HARDWARE REMOVAL Left 03/16/2015   Procedure: Removal Hardware Left Ankle;  Surgeon: Nadara Mustard, MD;  Location: [facility] OR;  Service: Orthopedics;  Laterality: Left;   IR AV DIALY SHUNT INTRO NEEDLE/INTRACATH INITIAL W/PTA/IMG LEFT  01/05/2018   IR AV DIALY SHUNT INTRO NEEDLE/INTRACATH INITIAL W/PTA/IMG LEFT  10/18/2022   IR FLUORO GUIDE CV LINE RIGHT  10/18/2022   IR US GUIDE VASC ACCESS LEFT  01/05/2018   IR US GUIDE VASC ACCESS RIGHT  10/18/2022   LEFT HEART CATH AND CORONARY ANGIOGRAPHY N/A 12/28/2017   Procedure: LEFT HEART CATH AND CORONARY ANGIOGRAPHY;  Surgeon: Kathleene Hazel, MD;  [facility];  Service: Cardiovascular;  Laterality: N/A;   LEFT HEART CATH AND CORONARY ANGIOGRAPHY N/A 07/31/2022   Procedure: LEFT HEART CATH AND CORONARY ANGIOGRAPHY;  Surgeon: Orbie Pyo, MD;  [facility];  Service: Cardiovascular;  Laterality: N/A;   LOWER EXTREMITY ANGIOGRAPHY N/A 03/21/2021   Procedure: LOWER EXTREMITY ANGIOGRAPHY;  Surgeon: Cephus Shelling, MD;  [facility];  Service: Cardiovascular;  Laterality: N/A;   ORIF ANKLE FRACTURE Left 11/20/2014   Procedure: OPEN REDUCTION INTERNAL FIXATION (ORIF) ANKLE FRACTURE;  Surgeon: Sheral Apley, MD;  Location: [facility];  Service: Orthopedics;  Laterality: Left;   PERIPHERAL VASCULAR BALLOON ANGIOPLASTY  01/03/2020   Procedure: PERIPHERAL VASCULAR BALLOON ANGIOPLASTY;  Surgeon: Nada Libman, MD;  [facility];  Service: Cardiovascular;;   PERIPHERAL VASCULAR BALLOON ANGIOPLASTY Left 03/12/2021   Procedure: PERIPHERAL VASCULAR BALLOON ANGIOPLASTY;  Surgeon: Nada Libman, MD;  [facility];  Service: Cardiovascular;  Laterality: Left;   PERIPHERAL VASCULAR BALLOON ANGIOPLASTY  08/22/2021   Procedure: PERIPHERAL VASCULAR BALLOON  ANGIOPLASTY;  Surgeon: Cephus Shelling, MD;  [facility];  Service: Cardiovascular;;  Left AVF PTA   TONSILLECTOMY     Family History  Family History  Family history unknown: Yes   Social History  reports that she has been smoking cigarettes. She has never used smokeless tobacco. She reports that she does not drink alcohol and does not use drugs. Allergies No Known Allergies Home medications Prior to Admission medications   Medication Sig Start Date End Date Taking? Authorizing Provider  acetaminophen (TYLENOL) 325 MG tablet Take 325 mg by mouth 2 (two) times daily.   Yes [provider]  albuterol (VENTOLIN HFA) 108 (90 Base) MCG/ACT inhaler Inhale 1-2 puffs into the lungs every 6 (six) hours as needed for wheezing or shortness of breath.   Yes [provider]  apixaban (ELIQUIS) 5 MG TABS tablet Take 2.5 mg by mouth 2 (two) times daily with a meal.   Yes [provider]  atorvastatin (LIPITOR) 80 MG tablet Take 1 tablet (  80 mg total) by mouth at bedtime. 08/05/22  Yes Rana Snare, DO  CALCIUM PO Take 1 tablet by mouth daily.   Yes [provider]  clopidogrel (PLAVIX) 75 MG tablet Take 1 tablet (75 mg total) by mouth daily. 08/06/22  Yes Rana Snare, DO  diclofenac Sodium (VOLTAREN) 1 % GEL Apply 4 g topically 4 (four) times daily as needed (for back pain). 08/05/22  Yes Rana Snare, DO  ferric citrate (AURYXIA) 1 GM 210 MG(Fe) tablet Take 420 mg by mouth 2 (two) times daily with a meal.   Yes [provider]  isosorbide mononitrate (IMDUR) 30 MG 24 hr tablet Take 1 tablet (30 mg total) by mouth daily with breakfast. 08/06/22  Yes Rana Snare, DO  Melatonin 10 MG TABS Take 10 mg by mouth at bedtime.   Yes [provider]  metoprolol succinate (TOPROL-XL) 25 MG 24 hr tablet Take 1 tablet (25 mg total) by mouth daily. 08/06/22  Yes Rana Snare, DO  multivitamin (RENA-VIT) TABS tablet Take 1 tablet by mouth at  bedtime.  12/11/17  Yes [provider]  nitroGLYCERIN (NITROSTAT) 0.4 MG SL tablet Place 0.4 mg under the tongue every 5 (five) minutes as needed for chest pain.   Yes [provider]  pantoprazole (PROTONIX) 40 MG tablet Take 40 mg by mouth daily.   Yes [provider]  Tiotropium Bromide Monohydrate (SPIRIVA RESPIMAT) 1.25 MCG/ACT AERS Inhale 2 puffs into the lungs every morning.   Yes [provider]  gabapentin (NEURONTIN) 100 MG capsule Take 100 mg by mouth 2 (two) times daily as needed (for neuropathic pain). Patient not taking: Reported on 01/15/2023 01/01/23   [provider]  insulin aspart (NOVOLOG) 100 UNIT/ML injection Inject 6 Units into the skin 3 (three) times daily as needed for high blood sugar (CBG >300). Patient not taking: Reported on 01/15/2023    [provider]  insulin detemir (LEVEMIR FLEXTOUCH) 100 UNIT/ML FlexPen Inject 3 Units into the skin daily. Can increase after discussing with physician Patient not taking: Reported on 01/15/2023 10/20/22   Alfredo Martinez, MD     Vitals:   01/16/23 0030 01/16/23 0537 01/16/23 0805 01/16/23 0830  BP: (!) 176/83  (!) 188/136 (!) 195/117  Pulse: (!) 56  (!) 55 (!) 57  Resp:   15 19  Temp: (!) 96.3 F (35.7 C) (!) 97.3 F (36.3 C)    TempSrc: Axillary Temporal    SpO2: 99%  96% 100%  Weight:  54.2 kg    Height:       Exam Gen alert, confused to date. At baseline No rash, cyanosis or gangrene Sclera anicteric, throat clear  No jvd or bruits Chest clear bilat to bases, no rales/ wheezing RRR no MRG Abd soft ntnd no mass or ascites +bs GU defer MS R foot sp toe amp x 2 Ext no LE or UE edema, no wounds or ulcers Neuro is alert, Ox 2 , nf   AVF+ bruit      Home meds include - albuterol, ferric citrate 2 ac, insulin aspart/ detemir, apixaban, atorvastatin, clopidogrel, isosorbide mononitrate 30 daily, metoprolol xl 25 every day, renavit, sl ntg, pantoprazole, spiriva  respimat, prns     OP HD: MWF [facility] 3.5h   400/1.5   56.8kg   2/2 bath  AVF  Heparin none - last OP HD 8/21, post wt 56.8kg   - hectorol 3 mcg IV three times per week - mircera IV q 4 wks,  last 8/21, due next month - last Hb 11.3    CXR 8/23 -  vasc congestion, no IS edema  Assessment/ Plan: Chest pain - EKG neg, trop slightly elevated. Per pmd.  ESRD - on HD MWF. Plan HD today/ tonight.  HTN / volume- BP's are up. Not grossly overloaded. CXR w/ vasc congestion. Plan UF 2-3 L w/ HD today as tolerated.  Anemia esrd - Hb 12.9, no esa needs. Follow.  MBD ckd - CCa in range, phos is up a bit. Cont IV vdra and auryxia as binder DM2 on insulin CAD - hx of PCI/ stents      Vinson Moselle  MD [facility] 01/16/2023, 9:46 AM  Recent Labs  Lab 01/15/23 1223 01/15/23 1347 01/16/23 0357  HGB 12.9  --   --   ALBUMIN  --  3.6 3.9  CALCIUM 9.5  --  9.7  PHOS  --   --  7.6*  CREATININE 5.65*  --  6.57*  K 4.9  --  4.9   Inpatient medications:  apixaban  2.5 mg Oral BID WC   atorvastatin  80 mg Oral QHS   calcium carbonate  1,250 mg Oral BID WC   Chlorhexidine Gluconate Cloth  6 each Topical Daily   clopidogrel  75 mg Oral Daily   isosorbide mononitrate  30 mg Oral Q breakfast   melatonin  10 mg Oral QHS   metoprolol succinate  25 mg Oral Daily   multivitamin  1 tablet Oral QHS   pantoprazole  40 mg Oral Daily   umeclidinium bromide  1 puff Inhalation Daily    naloxone, nitroGLYCERIN

## 2023-01-16 NOTE — Evaluation (Addendum)
Physical Therapy Evaluation Patient Details Name: Robin [name] MRN: [id number] DOB: 06/03/1950 Today's Date: 01/16/2023  History of Present Illness  72 yo female presents to [name] on 8/22 with SOB, midsternal pain, and n/v. Unresponsive event on 8/23 am, recovered and CT canceled. PMHx:  CAD, PCI, ESRD on HD MWF, dementia, bipolar disorder, a-fib, CHF, COPD, DM, HTN, HLD, DVT, R LE amputation (at least transmet).  Clinical Impression  Pt presents with generalized LE weakness, impaired functional mobility, and fatigue. Unclear if patient is close to baseline as family was not present and pt had difficulty recalling information. Pt required tactile/verbal cues for supine/sit transfer, however was able to complete with CGA. Pt required MinA for sit/stand transfers to assist with set-up and initiation of transfer. Pt was CGA with RW for ambulation. Pt with complaints of dizziness/fatigue with high BP readings (RN notified). Recommending home with 24/7 care for physical assistance. Acute PT to follow.         If plan is discharge home, recommend the following: A little help with walking and/or transfers;Assistance with cooking/housework;Assist for transportation;A little help with bathing/dressing/bathroom;Help with stairs or ramp for entrance   Can travel by private vehicle        Equipment Recommendations None recommended by PT (Pt owns equipment)  Recommendations for Other Services       Functional Status Assessment Patient has had a recent decline in their functional status and demonstrates the ability to make significant improvements in function in a reasonable and predictable amount of time.     Precautions / Restrictions Precautions Precautions: Fall      Mobility  Bed Mobility Overal bed mobility: Needs Assistance Bed Mobility: Supine to Sit     Supine to sit: Contact guard     General bed mobility comments: Pt needed max verbal and tactile cues for LE and trunk management. Pt  required increased time    Transfers Overall transfer level: Needs assistance Equipment used: Rolling walker (2 wheels) Transfers: Sit to/from Stand Sit to Stand: Min assist           General transfer comment: Pt required cues for hand placement on RW and bed. MinA for rise up. After pt initiates movement, pt is able to complete stand with CGA    Ambulation/Gait Ambulation/Gait assistance: Contact guard assist Gait Distance (Feet): 30 Feet (x15, x15) Assistive device: Rolling walker (2 wheels) Gait Pattern/deviations: Step-through pattern, Decreased step length - right, Decreased stride length, Shuffle, Narrow base of support Gait velocity: dec     General Gait Details: shuffled gait pattern with trunk lean to the R likely due to R forefoot amputation. Pt reports having a shoe for R ankle, however, did not see it in the room. Pt required cues for upright posture and navigation of obstacles with RW.      Balance Overall balance assessment: No apparent balance deficits (not formally assessed), Mild deficits observed, not formally tested                                           Pertinent Vitals/Pain Pain Assessment Pain Assessment: Faces Faces Pain Scale: Hurts little more Pain Location: my body Pain Descriptors / Indicators: Discomfort, Sore    Home Living Family/patient expects to be discharged to:: Private residence Living Arrangements: Children;Other relatives Available Help at Discharge: Family;Available PRN/intermittently Type of Home: House Home Access: Stairs to enter  Entrance Stairs-Number of Steps: 2   Home Layout: One level Home Equipment: Agricultural consultant (2 wheels);Shower seat;BSC/3in1;Wheelchair - manual      Prior Function Prior Level of Function : Independent/Modified Independent             Mobility Comments: able to self mobilize on RW ADLs Comments: Per pt report she is indep with all ADL's and IADL's (per chart her dtr  won't let her cook because she will burn the house down)     Extremity/Trunk Assessment   Upper Extremity Assessment Upper Extremity Assessment: Defer to OT evaluation    Lower Extremity Assessment Lower Extremity Assessment: Generalized weakness    Cervical / Trunk Assessment Cervical / Trunk Assessment: Kyphotic  Communication   Communication Communication: No apparent difficulties  Cognition Arousal: Lethargic Behavior During Therapy: Flat affect Overall Cognitive Status: No family/caregiver present to determine baseline cognitive functioning (documented dementia hx)                                 General Comments: AOx3, pt stated the year was 1994        General Comments General comments (skin integrity, edema, etc.): Pt reported feeling dizzy. Pt's 178/99 BP on toilet, 123 DBP in chair. RN notified of high BP and pt complaint of dizziness.    Exercises     Assessment/Plan    PT Assessment Patient needs continued PT services  PT Problem List Decreased strength;Decreased cognition;Decreased activity tolerance;Decreased safety awareness;Decreased mobility;Decreased balance       PT Treatment Interventions DME instruction;Gait training;Stair training;Functional mobility training;Therapeutic activities;Therapeutic exercise;Balance training;Neuromuscular re-education;Patient/family education    PT Goals (Current goals can be found in the Care Plan section)  Acute Rehab PT Goals Patient Stated Goal: to feel better PT Goal Formulation: With patient Time For Goal Achievement: 01/30/23 Potential to Achieve Goals: Good    Frequency Min 1X/week     Co-evaluation               AM-PAC PT "6 Clicks" Mobility  Outcome Measure Help needed turning from your back to your side while in a flat bed without using bedrails?: None Help needed moving from lying on your back to sitting on the side of a flat bed without using bedrails?: A Little Help needed  moving to and from a bed to a chair (including a wheelchair)?: A Little Help needed standing up from a chair using your arms (e.g., wheelchair or bedside chair)?: A Little Help needed to walk in hospital room?: A Little Help needed climbing 3-5 steps with a railing? : A Little 6 Click Score: 19    End of Session Equipment Utilized During Treatment: Gait belt Activity Tolerance: Patient limited by lethargy (and reports of dizziness) Patient left: in chair;with chair alarm set;with call bell/phone within reach Nurse Communication: Mobility status;Other (comment) (BP) PT Visit Diagnosis: Other abnormalities of gait and mobility (R26.89);Muscle weakness (generalized) (M62.81)    Time: 4098-1191 PT Time Calculation (min) (ACUTE ONLY): 36 min   Charges:   PT Evaluation $PT Eval Low Complexity: 1 Low PT Treatments $Therapeutic Activity: 8-22 mins PT General Charges $$ ACUTE PT VISIT: 1 Visit         Hilton Cork, PT, DPT Secure Chat Preferred  Rehab Office [phone]   Arturo Morton Brion Aliment 01/16/2023, 3:32 PM

## 2023-01-16 NOTE — Progress Notes (Signed)
   01/16/23 1925  Vitals  Temp (!) 97.3 F (36.3 C)  Pulse Rate 69  Resp (!) 26  BP (!) 163/73  SpO2 95 %  O2 Device Room Air  Weight  (bed not accurate)  Type of Weight Post-Dialysis  Post Treatment  Dialyzer Clearance Lightly streaked  Hemodialysis Intake (mL) 0 mL  Liters Processed 1400  Fluid Removed (mL) 1400 mL  Tolerated HD Treatment Yes   Received patient in bed to unit.  Alert and oriented.  Informed consent signed and in chart.   TX duration 2.34  Patient had tx discontinued early due to arrthymia changes Dr. Marisue Humble made aware of these -30 plus beat of vtach the first episode and then 10 beats ---changes x 2 episodes Transported back to the room  Alert, without acute distress.  Hand-off given to patient's nurse.   Access used: [facility] Access issues: no complications  Total UF removed: 1800 Medication(s) given: none   [name]

## 2023-01-16 NOTE — Progress Notes (Deleted)
I was present at this dialysis session, have reviewed the session and made  appropriate changes Vinson Moselle MD  [facility] 01/16/2023, 5:26 PM

## 2023-01-16 NOTE — Progress Notes (Signed)
PT Cancellation Note  Patient Details Name: Robin [name] MRN: [id number] DOB: 09-13-1950   Cancelled Treatment:    Reason Eval/Treat Not Completed: Medical issues which prohibited therapy - unresponsive episode this am, will check back when medically appropriate.   Marye Round, PT DPT Acute Rehabilitation Services Secure Chat Preferred  Office [phone]    Truddie Coco 01/16/2023, 8:33 AM

## 2023-01-16 NOTE — Progress Notes (Signed)
OT Cancellation Note  Patient Details Name: Robin [name] MRN: [id number] DOB: 1951-03-19   Cancelled Treatment:    Reason Eval/Treat Not Completed: Medical issues which prohibited therapy;Fatigue/lethargy limiting ability to participate.  Pt unresponsive this am, will hold therapy secondary to medical issues and check back tomorrow for appropriateness.   Perrin Maltese, OTR/L Acute Rehabilitation Services  Office [phone] 01/16/2023

## 2023-01-16 NOTE — Significant Event (Signed)
Rapid Response Event Note   Reason for Call :  "Unresponsive", MD already at bedside  Initial Focused Assessment:  Patient in bed, responding to repeated painful stimuli. MAE equally, noted right facial droop at rest, per daughter at bedside this is present intermittently, particularly when patient is asleep. Skin warm and dry, minimal edema present. Lungs clear, heart tones normal. Noted to take 10mg  melatonin approximately 2200. Per daughter patient takes this nightly, though was awake and able to talk to staff during VS/rounds 0530. Daughter states this is a typical morning for mother.   195/117 (138) HR 57 RR 19 O2 100% 2L [place]  CBG 135  Interventions:  EKG already done ABG 7.43/37/104 Narcan 0.4mg  IV CT Ammonia  Plan of Care:  Obtain CT, watch carefully over next hour.   Update: called into room by daughter 75 (almost post narcan) to notify staff that patient is wide awake. Patient states "I slept good", remembers Rapid RN attempting to wake up, "oh that was you?" MD notified, CTH cancelled.   Event Summary:  MD Notified: M. Chambliss MD, L. Hindel MD Call Time: 7425 Arrival Time: 0818 End Time: 0900  Truddie Crumble, RN

## 2023-01-16 NOTE — Significant Event (Signed)
Patient unresponsive, Dr Deirdre Priest at bedside. Called for stat FSBS, stat EKG. V/s obtained, charge RN notified. RT notified for stat ABG. Do not call for code stroke at this time per Dr Deirdre Priest.

## 2023-01-16 NOTE — Procedures (Signed)
I was present at this dialysis session, have reviewed the session and made  appropriate changes Vinson Moselle MD  [facility] 01/16/2023, 5:26 PM

## 2023-01-16 NOTE — Progress Notes (Signed)
 Pt receives out-pt HD at [facility] on MWF 6:00 am chair time. Will assist as needed.   Olivia Canter Renal Navigator [phone]

## 2023-01-16 NOTE — Progress Notes (Signed)
Daily Progress Note Intern Pager: [phone]  [name] Medical record number: [id number] Date of birth: 1950/06/12 Age: 72 y.o. Gender: female  Primary Care Provider: Center, [facility] Medical Consultants: cardiology, nephrology Code Status: full code  Pt Overview and Major Events to Date:  8/22: admitted for chest pain 8/23: rapid response for somnolence, unchanged EKG, no neuro deficits, returned to baseline shortly after  Assessment and Plan: Robin [name] is a 73 y.o. female presenting with chest pain likely secondary to missed medication doses due to N/V and incomplete hemodialysis session on Monday. EKG with slight T wave inversions, no ST elevations, plus elevated Troponins to 60. Will consult cardiology. HD today.  Pertinent PMH/PSH includes HTN, type II DM, HLD, bipolar disorder, ESRD on HD MWF, PEA arrest x2 in Feb 23, NSTEMI.    Assessment & Plan Chest pain at rest Known history of CAD with stent in place. Most recent cath in 03/24. Troponins 60 > 61 > 66 > 62. BNP >45000.  Low suspicion for ACS and pt already on antiplatelet and DOAC, decided not to load with ASA due to bleeding risk - Admit to progressive unit, inpatient status - Telemetry monitoring - Per cards - Repeat EKG in the morning- QT 540 - Continue home anti-anginals: Imdur and Toprol - SL Nitroglycerin and EKG STAT for recurrent CP  - Continue home Plavix and Statin - Appreciate ongoing cardiology recommendations Hypertension due to end stage renal disease caused by type 2 diabetes mellitus, on dialysis (HCC) Bps elevated to 170s-190s systolic since admission. Has not taken antihypertensives for 1 week in the setting of N/V. Got hydralazine 10mg  in the ED. - Continue at home BP medications - imdur 30mg , metoprolol 25mg  - Dialysis today  Congestive heart failure (CHF) (HCC) Enlarged cardiac silhouette with vascular congestion on CXR but not grossly volume overloaded on exam. Had HD just  yesterday on schedule, though some question of incomplete session on Monday. Last echo in 03/24 with LVEF 45-50%, global hypokinesis, Grade 2 DDF. BNP >45000 - Volume management with HD - Cardiology to see today  ESRD (end stage renal disease) (HCC) MWF Dialysis patient, did not receive full dialysis treatment Monday but had a full session on Wednesday by report.  - Dialysis today - Continue home phos binder and renal vitamin Transient altered mental status Patient did have some confusion on exam, and son states that patient's mental status will wax and wane.  States this is not uncommon if patient is not taking her medications correctly -Delirium precautions  Nausea & vomiting Several days. Accompanied by diarrhea per son. Unclear etiology but seems to be improving with time. Electrolytes not far from baseline, though an imperfect marker in this ESRD patient. Ongoing this AM - Divide medication administration - Tigan PRN - Need to take care with antiemetics given prolonged QTc - PO trial     FEN/GI: Renal, fluid restriction <2000 PPx: on Eliquis Dispo:pending clinical improvement  Subjective:  Pt very sedated early this morning per daughter. Opening eyes to noxious stimulation, moving all 4 extremities spontaneously. Got 10mg  melatonin last night, this is her regular nighttime dose per daughter. No other sedating medications on board, no response to Narcan. BG 135, EKG without ST elevations. ABG WNL.   About 15 minutes later, message from nursing that pt remembers getting pinched by rapid response nurse and stated "I slept great last night."  Talking to patient after this, patient states she does not recall the  events of the morning. Says she slept well. Endorses some chest soreness. Had BM.   Objective: Temp:  [96.3 F (35.7 C)-97.8 F (36.6 C)] 97.3 F (36.3 C) (08/23 0537) Pulse Rate:  [53-86] 56 (08/23 0030) Resp:  [14-23] 14 (08/22 1700) BP: (149-199)/(83-124) 176/83  (08/23 0030) SpO2:  [85 %-100 %] 99 % (08/23 0030) Weight:  [54.2 kg-61.2 kg] 54.2 kg (08/23 0537) Physical Exam: General: Older woman resting in bed, NAD Cardiovascular: RRR, normal S1/S2, no murmurs, rubs, gallops Respiratory: CTAB, normal WOB,  no wheezes, rales, rhonchi Abdomen: bowel sounds present, soft, nontender, nondistended Extremities: no edema to BLE Neuro: oriented to self, place, not date, responds appropriately to some questions  Laboratory: Most recent CBC Lab Results  Component Value Date   WBC 6.3 01/15/2023   HGB 12.9 01/15/2023   HCT 42.5 01/15/2023   MCV 97.0 01/15/2023   PLT 197 01/15/2023   Most recent BMP    Latest Ref Rng & Units 01/16/2023    3:57 AM  BMP  Glucose 70 - 99 mg/dL 213   BUN 8 - 23 mg/dL 38   Creatinine 0.86 - 1.00 mg/dL 5.78   Sodium 469 - 629 mmol/L 137   Potassium 3.5 - 5.1 mmol/L 4.9   Chloride 98 - 111 mmol/L 92   CO2 22 - 32 mmol/L 25   Calcium 8.9 - 10.3 mg/dL 9.7     EKG 5/28 AM QT 540, sinus rhythm, T wave inversions as seen on prior EKG  Imaging/Diagnostic Tests: none Lorayne Bender, MD 01/16/2023, 7:13 AM  PGY-1, [facility] FPTS Intern pager: [phone], text pages welcome Secure chat group [facility] Teaching Service

## 2023-01-17 DIAGNOSIS — Z711 Person with feared health complaint in whom no diagnosis is made: Secondary | ICD-10-CM

## 2023-01-17 DIAGNOSIS — Z515 Encounter for palliative care: Secondary | ICD-10-CM

## 2023-01-17 DIAGNOSIS — R079 Chest pain, unspecified: Secondary | ICD-10-CM | POA: Diagnosis not present

## 2023-01-17 DIAGNOSIS — R9431 Abnormal electrocardiogram [ECG] [EKG]: Secondary | ICD-10-CM | POA: Insufficient documentation

## 2023-01-17 DIAGNOSIS — Z789 Other specified health status: Secondary | ICD-10-CM | POA: Diagnosis not present

## 2023-01-17 LAB — HEPATITIS B SURFACE ANTIBODY, QUANTITATIVE: Hep B S AB Quant (Post): 7.6 m[IU]/mL — ABNORMAL LOW

## 2023-01-17 LAB — TROPONIN I (HIGH SENSITIVITY): Troponin I (High Sensitivity): 65 ng/L — ABNORMAL HIGH (ref <18)

## 2023-01-17 NOTE — Progress Notes (Signed)
Subjective: Seen and examined in room.  Denies any chest pain,  tolerated HD yesterday  1.4 L UF  Objective Vital signs in last 24 hours: Vitals:   01/17/23 0038 01/17/23 0349 01/17/23 0748 01/17/23 1152  BP: 125/72 (!) 141/78  (!) 151/79  Pulse: (!) 57 (!) 58  (!) 57  Resp: 18 12  18   Temp: (!) 97.3 F (36.3 C) 97.6 F (36.4 C)  97.6 F (36.4 C)  TempSrc: Axillary Oral  Oral  SpO2: 100% 98% 99% 98%  Weight:  52.1 kg    Height:       Weight change: -7 kg  Physical Exam: General: Alert, O x 3 this a.m. elderly female NAD Heart: Regular irregular (PVC on monitor) heart rate 60s no MRG Lungs: CTA bilaterally, nonlabored breathing room air Abdomen: NABS, soft NTND Extremities: No pedal edema Dialysis Access: aVF positive bruit  Home meds include - albuterol, ferric citrate 2 ac, insulin aspart/ detemir, apixaban, atorvastatin, clopidogrel, isosorbide mononitrate 30 daily, metoprolol xl 25 every day, renavit, sl ntg, pantoprazole, spiriva respimat, prns    OP HD: MWF [facility] 3.5h   400/1.5   56.8kg   2/2 bath  AVF  Heparin none - last OP HD 8/21, post wt 56.8kg   - hectorol 3 mcg IV three times per week - mircera IV q 4 wks, last 8/21, due next month - last Hb 11.3     CXR 8/23 -  vasc congestion, no IS edema   Problem/Plan: Chest pain -now resolved, EKG neg, trop slightly elevated. Per pmd.  ESRD - on HD MWF.  on schedule  HTN / volume- BP's are up. Not grossly overloaded. CXR w/ vasc congestion. Plan UF 2-3 L w/ HD today as tolerated.  Anemia esrd - Hb 12.9, no esa needs. Follow.  MBD ckd - CCa in range, phos is up a bit. Cont IV vdra and auryxia as binder DM2 on insulin CAD - hx of PCI/ stents  Lenny Pastel, PA-C [name] [phone] 01/17/2023,12:29 PM  LOS: 2 days   Labs: Basic Metabolic Panel: Recent Labs  Lab 01/15/23 1223 01/16/23 0357  NA 134* 137  K 4.9 4.9  CL 92* 92*  CO2 20* 25  GLUCOSE 193* 163*  BUN 27* 38*   CREATININE 5.65* 6.57*  CALCIUM 9.5 9.7  PHOS  --  7.6*   Liver Function Tests: Recent Labs  Lab 01/15/23 1347 01/16/23 0357  AST 31  --   ALT 28  --   ALKPHOS 148*  --   BILITOT 1.4*  --   PROT 7.7  --   ALBUMIN 3.6 3.9   Recent Labs  Lab 01/15/23 1325  LIPASE 21   Recent Labs  Lab 01/16/23 1410  AMMONIA 40*   CBC: Recent Labs  Lab 01/15/23 1223  WBC 6.3  HGB 12.9  HCT 42.5  MCV 97.0  PLT 197   Cardiac Enzymes: No results for input(s): "CKTOTAL", "CKMB", "CKMBINDEX", "TROPONINI" in the last 168 hours. CBG: Recent Labs  Lab 01/16/23 0800  GLUCAP 135*    Studies/Results: No results found. Medications:   amLODipine  5 mg Oral Daily   apixaban  2.5 mg Oral BID WC   atorvastatin  80 mg Oral QHS   calcium carbonate  1,250 mg Oral BID WC   Chlorhexidine Gluconate Cloth  6 each Topical Daily   clopidogrel  75 mg Oral Daily   isosorbide mononitrate  30 mg Oral Q breakfast  melatonin  10 mg Oral QHS   metoprolol succinate  25 mg Oral Daily   multivitamin  1 tablet Oral QHS   pantoprazole  40 mg Oral Daily   umeclidinium bromide  1 puff Inhalation Daily

## 2023-01-17 NOTE — Progress Notes (Signed)
Daily Progress Note Intern Pager: [phone]  [name] Medical record number: [id number] Date of birth: 1950-09-20 Age: 72 y.o. Gender: female  Primary Care Provider: Center[facility] Medical Consultants: Nephrology, cardiology (S/O) Code Status: Full  Pt Overview and Major Events to Date:  8/22: Admitted 8/23: After response for somnolence with return to baseline, polymorphic V. tach during HD  Assessment and Plan: Robin [name] is a 72 y.o. female who presented with chest pain likely secondary to missed medication doses in the setting of nausea/vomiting and incomplete HD sessions.  She remains difficult to treat in the setting of difficulty tolerating HD recently with polymorphic V. tach. Pertinent PMH/PSH includes HTN, type II DM, HLD, bipolar disorder, ESRD on HD MWF, PEA arrest x2 in Feb 23, NSTEMI.  Assessment & Plan QT prolongation Unable to complete HD yesterday due to asymptomatic polymorphic V. tach.  Her QTc is significantly prolonged and in the setting of transient altered mental status and nausea, vomiting, we are also attempting to avoid QT prolonging medications.  Concerning poor prognosis, see ESRD problem list below. -Avoid QT prolonging medications as able -Consult palliative medicine for CODE STATUS discussion Hypertension due to end stage renal disease caused by type 2 diabetes mellitus, on dialysis (HCC) Bps poorly controlled ranging 120s-200s/70s-100s.  - Continue at home BP medications -amlodipine 5 mg daily, imdur 30mg , metoprolol 25mg  Chest pain at rest (Resolved: 01/17/2023) Known history of CAD with stent in place. Most recent cath in 03/24. Troponins 60 > 61 > 66 > 62. BNP >45000.  Low suspicion for ACS and pt already on antiplatelet and DOAC, decided not to load with ASA due to bleeding risk - Per cards - Repeat EKG in the morning- QT 540 - Continue home anti-anginals: Imdur and Toprol - SL Nitroglycerin and EKG STAT for recurrent CP  -  Continue home Plavix and Statin - Appreciate ongoing cardiology recommendations Congestive heart failure (CHF) (HCC) Enlarged cardiac silhouette with vascular congestion on CXR but not grossly volume overloaded on exam.  Cardiology has signed off. Had HD just yesterday on schedule, though some question of incomplete session on Monday. Last echo in 03/24 with LVEF 45-50%, global hypokinesis, Grade 2 DDF. BNP >45000 - Volume management with HD ESRD (end stage renal disease) (HCC) MWF Dialysis patient, did not receive full treatment yesterday due to arrhythmia.  Nephrology to attempt again today.  Should she suffer these episodes again, she may no longer be tolerating HD which imposes a poor prognosis. - Dialysis today - Continue home phos binder and renal vitamin Transient altered mental status Persists, however it appears this is part of her baseline.  -Delirium precautions Nausea & vomiting Improving. - Tigan PRN   Chronic and Stable Problems: GERD: Protonix   FEN/GI: Renal, fluid restriction PPx: Eliquis Dispo:Home pending clinical improvement .   Subjective:  Daughter present in room bedside patient today.  States that her mother's nausea/vomiting seems to be improving although she is very concerned that patient was not able to tolerate HD yesterday.  We had a thorough discussion regarding prognosis should she be unable to continue dialysis and daughter was amenable to palliative care consult as they have an outpatient palliative medicine team as well.  Today, patient believed that she was at home but could state her name.  She denied any nausea.  Objective: Temp:  [97.3 F (36.3 C)-97.6 F (36.4 C)] 97.6 F (36.4 C) (08/24 1152) Pulse Rate:  [46-104] 57 (08/24 1152)  Resp:  [12-31] 18 (08/24 1152) BP: (125-218)/(69-124) 151/79 (08/24 1152) SpO2:  [95 %-100 %] 98 % (08/24 1152) Weight:  [52.1 kg-54.2 kg] 52.1 kg (08/24 0349) Physical Exam: General: Chronically ill-appearing,  fatigued, oriented to self Cardiovascular: RRR, no murmurs appreciated Respiratory: CTAB, normal WOB Abdomen: Normoactive bowel sounds, soft and nontender Extremities: No pitting edema BLEs  Laboratory: Most recent CBC Lab Results  Component Value Date   WBC 6.3 01/15/2023   HGB 12.9 01/15/2023   HCT 42.5 01/15/2023   MCV 97.0 01/15/2023   PLT 197 01/15/2023   Most recent BMP    Latest Ref Rng & Units 01/16/2023    3:57 AM  BMP  Glucose 70 - 99 mg/dL 161   BUN 8 - 23 mg/dL 38   Creatinine 0.96 - 1.00 mg/dL 0.45   Sodium 409 - 811 mmol/L 137   Potassium 3.5 - 5.1 mmol/L 4.9   Chloride 98 - 111 mmol/L 92   CO2 22 - 32 mmol/L 25   Calcium 8.9 - 10.3 mg/dL 9.7    Magnesium 2.6 Troponin 65 (remains flat)  Imaging/Diagnostic Tests: No recent imaging results. Shelby Mattocks, DO 01/17/2023, 2:38 PM  PGY-3, [facility] FPTS Intern pager: [phone], text pages welcome Secure chat group [facility] Teaching Service

## 2023-01-17 NOTE — Assessment & Plan Note (Addendum)
Persists, however it appears this is part of her baseline.  -Delirium precautions

## 2023-01-17 NOTE — Assessment & Plan Note (Addendum)
Enlarged cardiac silhouette with vascular congestion on CXR but not grossly volume overloaded on exam.  Cardiology has signed off. Had HD just yesterday on schedule, though some question of incomplete session on Monday. Last echo in 03/24 with LVEF 45-50%, global hypokinesis, Grade 2 DDF. BNP >45000 - Volume management with HD

## 2023-01-17 NOTE — Assessment & Plan Note (Addendum)
Unable to complete HD yesterday due to asymptomatic polymorphic V. tach.  Her QTc is significantly prolonged and in the setting of transient altered mental status and nausea, vomiting, we are also attempting to avoid QT prolonging medications.  Concerning poor prognosis, see ESRD problem list below. -Avoid QT prolonging medications as able -Consult palliative medicine for CODE STATUS discussion

## 2023-01-17 NOTE — Assessment & Plan Note (Addendum)
MWF Dialysis patient, did not receive full treatment yesterday due to arrhythmia.  Nephrology to attempt again today.  Should she suffer these episodes again, she may no longer be tolerating HD which imposes a poor prognosis. - Dialysis today - Continue home phos binder and renal vitamin

## 2023-01-17 NOTE — Assessment & Plan Note (Addendum)
Improving. - Tigan PRN

## 2023-01-17 NOTE — Plan of Care (Signed)
  Problem: Skin Integrity: Goal: Risk for impaired skin integrity will decrease Outcome: Progressing   

## 2023-01-17 NOTE — Progress Notes (Signed)
Discussed case with cardiology on-call Dr. Evette Doffing given increasing frequency of PVCs and recurrent NSVT on telemetry.  EKG shows deepening TWI in inferior leads and apparent progressive ST depression laterally.  Reviewed EKGs over the phone with Dr. Evette Doffing who feels that the TWI are non-specific and do not point to a specific coronary pattern in this patient with known CAD. From her point of view this is a patient that we can safely continue to monitor on telemetry with no indication for emergent heparinization or cath.  - We will continue to monitor for now - I have ordered repeat troponin - Increasing her beta blockade is one consideration but with a HR in the high-50s, we may be limited here.  Robin Mccoy, MD

## 2023-01-17 NOTE — Assessment & Plan Note (Signed)
Known history of CAD with stent in place. Most recent cath in 03/24. Troponins 60 > 61 > 66 > 62. BNP >45000.  Low suspicion for ACS and pt already on antiplatelet and DOAC, decided not to load with ASA due to bleeding risk - Per cards - Repeat EKG in the morning- QT 540 - Continue home anti-anginals: Imdur and Toprol - SL Nitroglycerin and EKG STAT for recurrent CP  - Continue home Plavix and Statin - Appreciate ongoing cardiology recommendations

## 2023-01-17 NOTE — Progress Notes (Addendum)
FMTS Brief Progress Note  S: Saw patient bedside during night rounds.  Upon arrival patient was sitting up and working with a nurse tech trying to lay down.  Patient states she is doing well but having difficulty settling in to sleep because she is not at her home.  She says she usually does not sleep well when she is not sleeping at home.  Otherwise denies any chest pain, headache, dizziness or shortness of breath.   O: BP 123/72 (BP Location: Right Arm)   Pulse 62   Temp 97.9 F (36.6 C) (Oral)   Resp 16   Ht 5\' 4"  (1.626 m)   Wt 52.1 kg   SpO2 99%   BMI 19.72 kg/m   General: Has situational awareness, Elderly, NAD CV: RRR, no murmurs, normal S1/S2 Pulm: CTAB, good WOB on RA Ext: No BLE edema  A/P: Stable tonight and not in any acute distress.  Mental status seems to be at baseline. -Will continue close monitoring - Orders reviewed. Labs for AM ordered, which was adjusted as needed.  -Plan per day team.   Jerre Simon, MD 01/17/2023, 8:59 PM PGY-3, [name] Night Resident  Please page [phone] with questions.

## 2023-01-17 NOTE — Consult Note (Signed)
Consultation Note Date: 01/17/2023   [name]  DOB: Dec 21, 1950  MRN: [id number]  Age / Sex: 72 y.o., female  PCP: [facility] Referring Physician: Caro Laroche, DO  Reason for Consultation: Establishing goals of care, "code status"  HPI/Patient Profile: 72 y.o. female  with past medical history of end-stage renal disease on dialysis, PAD and CAD with prior circumflex stenting in 2016, recent chest pain and NSTEMI in March 2024 with moderate nonobstructive coronary artery disease by cath was admitted on 01/15/2023 with hypertension due to ESRD on HD, transient AMS, and nausea/vomiting/diarrhea.     Clinical Assessment and Goals of Care: Discussed case in detail with FMTS.   I have reviewed medical records including EPIC notes, labs, and imaging. Received report from primary RN - no acute concerns. RN reports patient is alert and oriented to self, sometimes place. Per RN, patient remains with poor appetite; was able to ambulate to BR today.   Went to visit patient at bedside - no family/visitors present. Patient was lying in bed awake, alert, oriented to self, time, place (though did look at calendar and white board for information) and able to participate in simple conversation. Unsure if she is able to make complex medical decisions - would benefit from [facility] present for GOC/code status discussions. No signs or non-verbal gestures of pain or discomfort noted. No respiratory distress, increased work of breathing, or secretions noted. Patient reports feeling "sick." She is feeding herself lunch - only 10% eaten.   12:55 PM Attempted to call daughter/HCPOA #1/Robin to discuss diagnosis, prognosis, GOC, EOL wishes, disposition, and options - no answer - confidential voicemail left and PMT phone number provided with request to return call.  Primary Decision Maker: PATIENT with  assistance from HCPOAs: #1 daughter/Robin [name] and #2 son/Robin [name]    SUMMARY OF RECOMMENDATIONS   Unable to reach daughter/HCPOA #1 today - left VM for return call Continue full code/full scope until further GOC can be completed PMT will continue to follow and support holistically   Code Status/Advance Care Planning: Full code  Palliative Prophylaxis:  Aspiration, Delirium Protocol, Frequent Pain Assessment, Oral Care, and Turn Reposition  Additional Recommendations (Limitations, Scope, Preferences): Full Scope Treatment  Psycho-social/Spiritual:  Desire for further Chaplaincy support:no Created space and opportunity for patient to express thoughts and feelings regarding patient's current medical situation.  Emotional support and therapeutic listening provided.  Prognosis:  Unable to determine  Discharge Planning: To Be Determined      Primary Diagnoses: Present on Admission:  Chest pain at rest  ESRD (end stage renal disease) (HCC)  Transient altered mental status   I have reviewed the medical record, interviewed the patient and family, and examined the patient. The following aspects are pertinent.  Past Medical History:  Diagnosis Date   Acute delirium 11/18/2014   Acute encephalopathy    Acute on chronic respiratory failure with hypoxia (HCC) 09/09/2016   Acute respiratory failure with hypoxia (HCC) 11/29/2015   Anemia in chronic kidney disease 12/09/2014   Anxiety    Arthritis    Benign hypertension    Bipolar affective disorder (HCC) 12/09/2014   CAD (coronary artery disease), native coronary artery with 2 stents  11/17/2014   Charcot foot due to diabetes mellitus (HCC)    Chronic combined systolic and diastolic CHF (congestive heart failure) (HCC) 11/18/2014   Closed left ankle fracture 11/17/2014   Confusion 01/21/2015   Depression    Diabetes mellitus without complication (HCC)  End stage renal disease on dialysis [facility])    Fracture dislocation of ankle  11/17/2014   GERD (gastroesophageal reflux disease)    Heart murmur    History of blood transfusion    History of bronchitis    History of pneumonia    Hyperlipidemia 03/26/2015   Hypertension associated with diabetes (HCC) 11/18/2014   Hypertensive heart/renal disease with failure (HCC) 12/09/2014   Hypokalemia 11/17/2014   Multiple falls 01/21/2015   Nausea & vomiting 01/15/2023   Obesity 11/17/2014   Onychomycosis 10/07/2016   Right leg DVT (HCC) 12/05/2015   SIRS (systemic inflammatory response syndrome) (HCC) 08/08/2016   Sleep apnea    Unstable angina (HCC) 12/26/2017   Ventricular tachycardia (HCC)    Social History   Socioeconomic History   Marital status: Divorced    Spouse name: Not on file   Number of children: Not on file   Years of education: Not on file   Highest education level: Not on file  Occupational History   Not on file  Tobacco Use   Smoking status: Every Day    Current packs/day: 1.00    Types: Cigarettes   Smokeless tobacco: Never  Vaping Use   Vaping status: Never Used  Substance and Sexual Activity   Alcohol use: No   Drug use: No   Sexual activity: Never  Other Topics Concern   Not on file  Social History Narrative   Not on file   Social Determinants of Health   Financial Resource Strain: Not on file  Food Insecurity: No Food Insecurity (01/15/2023)   Hunger Vital Sign    Worried About Running Out of Food in the Last Year: Never true    Ran Out of Food in the Last Year: Never true  Transportation Needs: No Transportation Needs (01/15/2023)   PRAPARE - Administrator, Civil Service (Medical): No    Lack of Transportation (Non-Medical): No  Physical Activity: Not on file  Stress: Not on file  Social Connections: Not on file   Family History  Family history unknown: Yes   Scheduled Meds:  amLODipine  5 mg Oral Daily   apixaban  2.5 mg Oral BID WC   atorvastatin  80 mg Oral QHS   calcium carbonate  1,250 mg Oral BID WC    Chlorhexidine Gluconate Cloth  6 each Topical Daily   clopidogrel  75 mg Oral Daily   isosorbide mononitrate  30 mg Oral Q breakfast   melatonin  10 mg Oral QHS   metoprolol succinate  25 mg Oral Daily   multivitamin  1 tablet Oral QHS   pantoprazole  40 mg Oral Daily   umeclidinium bromide  1 puff Inhalation Daily   Continuous Infusions: PRN Meds:.nitroGLYCERIN, trimethobenzamide Medications Prior to Admission:  Prior to Admission medications   Medication Sig Start Date End Date Taking? Authorizing Provider  acetaminophen (TYLENOL) 325 MG tablet Take 325 mg by mouth 2 (two) times daily.   Yes [provider]  albuterol (VENTOLIN HFA) 108 (90 Base) MCG/ACT inhaler Inhale 1-2 puffs into the lungs every 6 (six) hours as needed for wheezing or shortness of breath.   Yes [provider]  apixaban (ELIQUIS) 5 MG TABS tablet Take 2.5 mg by mouth 2 (two) times daily with a meal.   Yes [provider]  atorvastatin (LIPITOR) 80 MG tablet Take 1 tablet (80 mg total) by mouth at bedtime. 08/05/22  Yes Rana Snare, DO  CALCIUM PO Take 1 tablet  by mouth daily.   Yes [provider]  clopidogrel (PLAVIX) 75 MG tablet Take 1 tablet (75 mg total) by mouth daily. 08/06/22  Yes Rana Snare, DO  diclofenac Sodium (VOLTAREN) 1 % GEL Apply 4 g topically 4 (four) times daily as needed (for back pain). 08/05/22  Yes Rana Snare, DO  ferric citrate (AURYXIA) 1 GM 210 MG(Fe) tablet Take 420 mg by mouth 2 (two) times daily with a meal.   Yes [provider]  isosorbide mononitrate (IMDUR) 30 MG 24 hr tablet Take 1 tablet (30 mg total) by mouth daily with breakfast. 08/06/22  Yes Rana Snare, DO  Melatonin 10 MG TABS Take 10 mg by mouth at bedtime.   Yes [provider]  metoprolol succinate (TOPROL-XL) 25 MG 24 hr tablet Take 1 tablet (25 mg total) by mouth daily. 08/06/22  Yes Rana Snare, DO  multivitamin (RENA-VIT) TABS tablet Take 1 tablet by mouth  at bedtime.  12/11/17  Yes [provider]  nitroGLYCERIN (NITROSTAT) 0.4 MG SL tablet Place 0.4 mg under the tongue every 5 (five) minutes as needed for chest pain.   Yes [provider]  pantoprazole (PROTONIX) 40 MG tablet Take 40 mg by mouth daily.   Yes [provider]  Tiotropium Bromide Monohydrate (SPIRIVA RESPIMAT) 1.25 MCG/ACT AERS Inhale 2 puffs into the lungs every morning.   Yes [provider]  gabapentin (NEURONTIN) 100 MG capsule Take 100 mg by mouth 2 (two) times daily as needed (for neuropathic pain). Patient not taking: Reported on 01/15/2023 01/01/23   [provider]  insulin aspart (NOVOLOG) 100 UNIT/ML injection Inject 6 Units into the skin 3 (three) times daily as needed for high blood sugar (CBG >300). Patient not taking: Reported on 01/15/2023    [provider]  insulin detemir (LEVEMIR FLEXTOUCH) 100 UNIT/ML FlexPen Inject 3 Units into the skin daily. Can increase after discussing with physician Patient not taking: Reported on 01/15/2023 10/20/22   Alfredo Martinez, MD   No Known Allergies Review of Systems  Constitutional:  Positive for activity change, appetite change and fatigue.  Respiratory:  Negative for shortness of breath.   Gastrointestinal:  Positive for nausea and vomiting.  Neurological:  Positive for weakness.  All other systems reviewed and are negative.   Physical Exam Vitals and nursing note reviewed.  Constitutional:      General: She is not in acute distress. Pulmonary:     Effort: No respiratory distress.  Skin:    General: Skin is warm and dry.  Neurological:     Mental Status: She is alert.     Motor: Weakness present.  Psychiatric:        Attention and Perception: Attention normal.        Behavior: Behavior is cooperative.     Vital Signs: BP (!) 151/79 (BP Location: Right Arm)   Pulse (!) 57   Temp 97.6 F (36.4 C) (Oral)   Resp 18   Ht 5\' 4"  (1.626 m)   Wt 52.1 kg   SpO2 98%    BMI 19.72 kg/m  Pain Scale: 0-10   Pain Score: 0-No pain   SpO2: SpO2: 98 % O2 Device:SpO2: 98 % O2 Flow Rate: .O2 Flow Rate (L/min): 2 L/min  IO: Intake/output summary:  Intake/Output Summary (Last 24 hours) at 01/17/2023 1244 Last data filed at 01/16/2023 1925 Gross per 24 hour  Intake --  Output 1400 ml  Net -1400 ml    LBM:   Baseline  Weight: Weight: 61.2 kg Most recent weight: Weight: 52.1 kg     Palliative Assessment/Data: PPS 60%     Time In: 1245 Time Out: 1345 Time Total: 60 minutes  Greater than 50%  of this time was spent counseling and coordinating care related to the above assessment and plan.  Signed by: Haskel Khan, NP   Please contact Palliative Medicine Team phone at [phone] for questions and concerns.  For individual provider: See Amion  *Portions of this note are a verbal dictation therefore any spelling and/or grammatical errors are due to the "[employer] One" system interpretation.

## 2023-01-17 NOTE — Evaluation (Signed)
Occupational Therapy Evaluation Patient Details Name: Robin [name] MRN: [id number] DOB: [birth year] Today's Date: 01/17/2023   History of Present Illness 72 yo female [facility] with SOB, midsternal pain, and n/v. Unresponsive event on 8/23 am, recovered and CT canceled. PMHx:  CAD, PCI, ESRD on HD MWF, dementia, bipolar disorder, a-fib, CHF, COPD, DM, HTN, HLD, DVT, R LE amputation (at least transmet).   Clinical Impression   Patient lives at home with grandson and daughter there at times.  Patient completed functional mobility with RW/rollator and requires assist from family/caregivers for ADLs and dependent for IADLs.Pt required MinA for supine to sit and  sit stand Pt was CGA with RW for ambulation. Patient required mod A for clothing mgmt and min A for toileting tranfer.  Patient would benefit from additional OT intervention to address functional deficits in order for patient to return to PLOF      If plan is discharge home, recommend the following: A little help with walking and/or transfers;Assistance with cooking/housework;Assist for transportation;Help with stairs or ramp for entrance;Direct supervision/assist for financial management;Direct supervision/assist for medications management;A little help with bathing/dressing/bathroom    Functional Status Assessment  Patient has had a recent decline in their functional status and demonstrates the ability to make significant improvements in function in a reasonable and predictable amount of time.  Equipment Recommendations  None recommended by OT    Recommendations for Other Services       Precautions / Restrictions Precautions Precautions: Fall Restrictions Weight Bearing Restrictions: No      Mobility Bed Mobility Overal bed mobility: Needs Assistance Bed Mobility: Supine to Sit     Supine to sit: Min assist, HOB elevated          Transfers Overall transfer level: Needs assistance Equipment used: Rolling  walker (2 wheels) Transfers: Sit to/from Stand Sit to Stand: Min assist           General transfer comment: Pt required cues for hand placement on RW and bed.      Balance                                           ADL either performed or assessed with clinical judgement   ADL Overall ADL's : Needs assistance/impaired Eating/Feeding: Set up;Sitting   Grooming: Wash/dry face;Wash/dry hands;Contact guard assist;Standing   Upper Body Bathing: Minimal assistance;Set up;Cueing for safety   Lower Body Bathing: Moderate assistance   Upper Body Dressing : Minimal assistance;Sitting   Lower Body Dressing: Moderate assistance   Toilet Transfer: Contact guard assist;Rolling walker (2 wheels);Regular Toilet   Toileting- Clothing Manipulation and Hygiene: Minimal assistance       Functional mobility during ADLs: Contact guard assist;Rollator (4 wheels)       Vision Baseline Vision/History: 2 Legally blind Patient Visual Report: No change from baseline       Perception         Praxis         Pertinent Vitals/Pain Pain Assessment Pain Assessment: No/denies pain     Extremity/Trunk Assessment Upper Extremity Assessment Upper Extremity Assessment: Generalized weakness       Cervical / Trunk Assessment Cervical / Trunk Assessment: Kyphotic   Communication Communication Communication: No apparent difficulties   Cognition   Behavior During Therapy: Flat affect Overall Cognitive Status: Impaired/Different from baseline Area of Impairment: Memory (patient reports that she  lives along but in fact grandson lives with her and dsuaghter lives there at times)                                     General Comments       Exercises     Shoulder Instructions      Home Living Family/patient expects to be discharged to:: Private residence Living Arrangements: Children;Other relatives (grandson) Available Help at Discharge:  Family;Available PRN/intermittently Type of Home: House Home Access: Stairs to enter [employer] of Steps: 2 Entrance Stairs-Rails: Right Home Layout: One level     Bathroom Shower/Tub: Tub/shower unit;Walk-in shower   Bathroom Toilet: Standard Bathroom Accessibility: Yes   Home Equipment: Agricultural consultant (2 wheels);Shower seat;BSC/3in1;Wheelchair - manual          Prior Functioning/Environment Prior Level of Function : Independent/Modified Independent               ADLs Comments:  (family completes all IADLs, caregiver in home 4-6 hours that help patient with ADLs)        OT Problem List: Decreased strength;Decreased activity tolerance;Decreased cognition;Decreased safety awareness      OT Treatment/Interventions: Self-care/ADL training;Therapeutic exercise;Neuromuscular education;DME and/or AE instruction;Therapeutic activities;Patient/family education    OT Goals(Current goals can be found in the care plan section) Acute Rehab OT Goals OT Goal Formulation: With patient Time For Goal Achievement: 01/31/23 Potential to Achieve Goals: Good  OT Frequency: Min 1X/week    Co-evaluation PT/OT/SLP Co-Evaluation/Treatment: Yes            AM-PAC OT "6 Clicks" Daily Activity     Outcome Measure Help from another person eating meals?: A Little Help from another person taking care of personal grooming?: A Little Help from another person toileting, which includes using toliet, bedpan, or urinal?: A Lot Help from another person bathing (including washing, rinsing, drying)?: A Lot Help from another person to put on and taking off regular upper body clothing?: A Little Help from another person to put on and taking off regular lower body clothing?: A Lot 6 Click Score: 15   End of Session Equipment Utilized During Treatment: Gait belt;Rolling walker (2 wheels) Nurse Communication: Mobility status  Activity Tolerance: Patient tolerated treatment well Patient  left: in bed;with call bell/phone within reach  OT Visit Diagnosis: Unsteadiness on feet (R26.81);Muscle weakness (generalized) (M62.81)                Time: 4132-4401 OT Time Calculation (min): 40 min Charges:  OT General Charges $OT Visit: 1 Visit OT Treatments $Self Care/Home Management : 23-37 mins  Governor Specking OT/L  Denice Paradise 01/17/2023, 1:28 PM

## 2023-01-17 NOTE — Assessment & Plan Note (Addendum)
Bps poorly controlled ranging 120s-200s/70s-100s.  - Continue at home BP medications -amlodipine 5 mg daily, imdur 30mg , metoprolol 25mg 

## 2023-01-18 DIAGNOSIS — Z515 Encounter for palliative care: Secondary | ICD-10-CM | POA: Diagnosis not present

## 2023-01-18 DIAGNOSIS — Z66 Do not resuscitate: Secondary | ICD-10-CM

## 2023-01-18 DIAGNOSIS — Z789 Other specified health status: Secondary | ICD-10-CM | POA: Diagnosis not present

## 2023-01-18 DIAGNOSIS — R079 Chest pain, unspecified: Secondary | ICD-10-CM | POA: Diagnosis not present

## 2023-01-18 MED ORDER — AMLODIPINE BESYLATE 5 MG PO TABS
5.0000 mg | ORAL_TABLET | Freq: Every day | ORAL | 0 refills | Status: DC
  Filled 2023-01-18: qty 30, 30d supply, fill #0

## 2023-01-18 MED ORDER — AMLODIPINE BESYLATE 5 MG PO TABS: 5.0000 mg | ORAL_TABLET | Freq: Every day | ORAL | 0 refills | Status: DC

## 2023-01-18 NOTE — Discharge Summary (Signed)
Family Medicine Teaching [facility] Discharge Summary  [name] Medical record number: [id number] Date of birth: 1951/01/23 Age: 72 y.o. Gender: female Date of Admission: 01/15/2023  Date of Discharge: 01/18/23  Admitting Physician: Para March, DO  Primary Care Provider: [facility] Consultants: Cardiology, nephrology   Indication for Hospitalization: Chest pain, vomiting   Discharge Diagnoses/Problem List:  Principal Problem for Admission: Uremia, Vomiting  Other Problems addressed during stay:  Active Problems:   Hypertension due to end stage renal disease caused by type 2 diabetes mellitus, on dialysis (HCC)   Congestive heart failure (CHF) (HCC)   ESRD (end stage renal disease) (HCC)   Transient altered mental status   Nausea & vomiting   QT prolongation    Brief Hospital Course:  Robin [name] is a 72 y.o.female with a history of NSTEMI, HTN, T2DM, CAD (2 stents 07/2022), ESRD (MWF dialysis), bipolar who was admitted to the [facility] Medicine Teaching Service at [facility] for chest pain. Her hospital course is detailed below:  Chest pain Presented with chest pain x2 days. Has been sick with N/V/D and had been vomiting her home imdur and metoprolol. Patient had shortened dialysis sessions on Monday and Wednesday due to chest pain and incontinence. Cardiology was consulted. They believe chest pain is due to hypertensive urgency with possible demand ischemia. They recommended adding amlodipine 5mg  to antihypertensive regimen. Imdur was continued at current dose as hypertension improved with dialysis. Patient had recurring runs of Vtach for 20-30 beats while in and out of dialysis. She also had an episode of torsades. Electrolytes were optimized. However, patient continued to have runs of Vtach. Patient and family decided DNR.   N/V/D Likely viral or due to uremia from shortened dialysis sessions. Has improved since being here.  ESRD MWF  dialysis pt. Has not received full dialysis treatments recently for various reasons (incontinence, chest pain). Dialyzed for 2 sessions however had to stop second session due to [place].   AMS Episode of somnolence on 8/23 AM, pt intermittently responsive to noxious stimuli. No lab abnormalities or EKG changes. Most likely due to uremia from shortened dialysis sessions. Pt returned to mental status baseline shortly after rapid response team evaluation. Patient's daughter informed patient was on abilify at home. Was given one dose on day of discharge.   Code Status change  Discussed poor prognosis with multiple runs of V. tach and prolonged QTc with patient and patient's daughter.  Patient's daughter initially hesitant as living will had said full code and full treatment however now that prognosis has changed patient and patient's daughter agree with DNR.  As patient has improved will be discharged.  Follow-up with outpatient palliative care services.  Will continue dialysis until unable to.  Other chronic conditions were medically managed with home medications and formulary alternatives as necessary (DM2, HTN, Bipolar disorder, HLD)  PCP Follow-up Recommendations: Review blood pressure medications- added amlodipine, continued imdur. Daughter informed on day of discharge patient is also on lisinopril. Consider titrating medications and adding lisinopril back to her regimen.  Patient has prolonged Qtc, avoid wtc prolonging medications.  Patient is now DNR and working with outpatient palliative medicine.    Disposition: home with home health   Discharge Condition: stable  Discharge Exam:  Vitals:   01/18/23 0734 01/18/23 0905  BP: 133/78   Pulse: 65   Resp: 18   Temp: 97.7 F (36.5 C)   SpO2: 100% 99%   General: Elderly chronically ill, in no  acute distress, eating breakfast  CV: RRR, radial pulses equal and palpable, no BLE edema  Resp: Normal work of breathing on room air Abd: Soft, non  tender, non distended  Neuro: Alert & oriented to self, a little confused but pleasant and conversant, using all extremities equally    Significant Procedures:  Hemodyalsis   Significant Labs and Imaging:  No results for input(s): "WBC", "HGB", "HCT", "PLT" in the last 48 hours. Recent Labs  Lab 01/16/23 1410  MG 2.6*    Chest Xray  Enlarged heart with vascular congestion. Right IJ line.   Results/Tests Pending at Time of Discharge:  None  Discharge Medications:  Allergies as of 01/18/2023   No Known Allergies      Medication List     STOP taking these medications    gabapentin 100 MG capsule Commonly known as: NEURONTIN   insulin aspart 100 UNIT/ML injection Commonly known as: novoLOG   Levemir FlexTouch 100 UNIT/ML FlexTouch Pen Generic drug: insulin detemir       TAKE these medications    acetaminophen 325 MG tablet Commonly known as: TYLENOL Take 325 mg by mouth 2 (two) times daily.   albuterol 108 (90 Base) MCG/ACT inhaler Commonly known as: VENTOLIN HFA Inhale 1-2 puffs into the lungs every 6 (six) hours as needed for wheezing or shortness of breath.   amLODipine 5 MG tablet Commonly known as: NORVASC Take 1 tablet (5 mg total) by mouth daily. Start taking on: January 19, 2023   apixaban 5 MG Tabs tablet Commonly known as: ELIQUIS Take 2.5 mg by mouth 2 (two) times daily with a meal.   atorvastatin 80 MG tablet Commonly known as: LIPITOR Take 1 tablet (80 mg total) by mouth at bedtime.   CALCIUM PO Take 1 tablet by mouth daily.   clopidogrel 75 MG tablet Commonly known as: PLAVIX Take 1 tablet (75 mg total) by mouth daily.   diclofenac Sodium 1 % Gel Commonly known as: VOLTAREN Apply 4 g topically 4 (four) times daily as needed (for back pain).   ferric citrate 1 GM 210 MG(Fe) tablet Commonly known as: AURYXIA Take 420 mg by mouth 2 (two) times daily with a meal.   isosorbide mononitrate 30 MG 24 hr tablet Commonly known as:  IMDUR Take 1 tablet (30 mg total) by mouth daily with breakfast.   Melatonin 10 MG Tabs Take 10 mg by mouth at bedtime.   metoprolol succinate 25 MG 24 hr tablet Commonly known as: TOPROL-XL Take 1 tablet (25 mg total) by mouth daily.   multivitamin Tabs tablet Take 1 tablet by mouth at bedtime.   nitroGLYCERIN 0.4 MG SL tablet Commonly known as: NITROSTAT Place 0.4 mg under the tongue every 5 (five) minutes as needed for chest pain.   pantoprazole 40 MG tablet Commonly known as: PROTONIX Take 40 mg by mouth daily.   Spiriva Respimat 1.25 MCG/ACT Aers Generic drug: Tiotropium Bromide Monohydrate Inhale 2 puffs into the lungs every morning.        Discharge Instructions: Please refer to Patient Instructions section of EMR for full details.  Patient was counseled important signs and symptoms that should prompt return to medical care, changes in medications, dietary instructions, activity restrictions, and follow up appointments.   Follow-Up Appointments:  Follow-up Information     [facility] Follow up.   Specialty: Home Health Services Why: For home health services. Contact information: [address] [place] [address] [phone]  None Scheduled   Lockie Mola, MD 01/18/2023, 12:55 PM PGY-2, [facility]

## 2023-01-18 NOTE — Progress Notes (Signed)
Subjective: Patient eating lunch denies any chest pain or SOB.  Noted for discharge today.  I have discussed with family members present including sister who gives her meds /we can decrease BFR on dialysis might help with her cardiac condition and CP on dialysis.  Also had discussion about her signing off an outpatient center early, she will try to do better.  Caregiver that gives meds that she may hold psych med that could cause arrhythmia  Objective Vital signs in last 24 hours: Vitals:   01/18/23 0020 01/18/23 0510 01/18/23 0734 01/18/23 0905  BP: (!) 144/91 105/66 133/78   Pulse: 68 (!) 59 65   Resp: 18 19 18    Temp: 97.8 F (36.6 C) 97.8 F (36.6 C) 97.7 F (36.5 C)   TempSrc: Oral Oral Oral   SpO2: 98% 100% 100% 99%  Weight:  54.3 kg    Height:       Weight change: 0.1 kg  Physical Exam: General: Alert, O x 3 this a.m. elderly female NAD Heart: Regular, rare irregular. heart rate 60s no MRG Lungs: CTA bilaterally, nonlabored breathing room air Abdomen: NABS, soft NTND Extremities: No pedal edema Dialysis Access:R internal jugular TDC  dressing dry/clean   Home meds include - albuterol, ferric citrate 2 ac, insulin aspart/ detemir, apixaban, atorvastatin, clopidogrel, isosorbide mononitrate 30 daily, metoprolol xl 25 every day, renavit, sl ntg, pantoprazole, spiriva respimat, prns    OP HD: [facility] 3.5h   400/1.5   56.8kg   2/2 bath  R internal jugular TDC / Heparin none - last OP HD 8/21, post wt 56.8kg   - hectorol 3 mcg IV three times per week - mircera IV q 4 wks, last 8/21, due next month - last Hb 11.3     CXR 8/23 -  vasc congestion, no IS edema   Problem/Plan: Chest pain -now resolved, EKG neg, trop slightly elevated. Per pmd.  ESRD - on HD MWF.  on schedule  HTN / volume- BP's are up. Not grossly overloaded. CXR w/ vasc congestion. Below edw  "I'm standing per pt"  lower edw to 54.5 Anemia esrd - Hb 12.9, no esa needs. Follow.  MBD ckd - CCa in range,  phos is up a bit. Cont IV vdra and auryxia as binder DM2 on insulin CAD - hx of PCI/ stents// will decr bfr to 350 on hd    Lenny Pastel, PA-C [facility] [phone] 01/18/2023,12:34 PM  LOS: 3 days   Labs: Basic Metabolic Panel: Recent Labs  Lab 01/15/23 1223 01/16/23 0357  NA 134* 137  K 4.9 4.9  CL 92* 92*  CO2 20* 25  GLUCOSE 193* 163*  BUN 27* 38*  CREATININE 5.65* 6.57*  CALCIUM 9.5 9.7  PHOS  --  7.6*   Liver Function Tests: Recent Labs  Lab 01/15/23 1347 01/16/23 0357  AST 31  --   ALT 28  --   ALKPHOS 148*  --   BILITOT 1.4*  --   PROT 7.7  --   ALBUMIN 3.6 3.9   Recent Labs  Lab 01/15/23 1325  LIPASE 21   Recent Labs  Lab 01/16/23 1410  AMMONIA 40*   CBC: Recent Labs  Lab 01/15/23 1223  WBC 6.3  HGB 12.9  HCT 42.5  MCV 97.0  PLT 197   Cardiac Enzymes: No results for input(s): "CKTOTAL", "CKMB", "CKMBINDEX", "TROPONINI" in the last 168 hours. CBG: Recent Labs  Lab 01/16/23 0800  GLUCAP 135*  Studies/Results: No results found. Medications:   amLODipine  5 mg Oral Daily   apixaban  2.5 mg Oral BID WC   atorvastatin  80 mg Oral QHS   calcium carbonate  1,250 mg Oral BID WC   Chlorhexidine Gluconate Cloth  6 each Topical Daily   clopidogrel  75 mg Oral Daily   isosorbide mononitrate  30 mg Oral Q breakfast   melatonin  10 mg Oral QHS   metoprolol succinate  25 mg Oral Daily   multivitamin  1 tablet Oral QHS   pantoprazole  40 mg Oral Daily   umeclidinium bromide  1 puff Inhalation Daily

## 2023-01-18 NOTE — Plan of Care (Signed)

## 2023-01-18 NOTE — Discharge Instructions (Signed)
Dear Norlene Campbell,   Thank you so much for allowing [place] to be part of your care!  You were admitted to [name] for vomiting and chest pain.  This was most likely due to buildup of substances in your blood due to shortened dialysis sessions.  The chest pain that was making you shorten your dialysis sessions was most likely due to your heart getting tired.  Is starting to have more abnormal rhythms called torsades and ventricular tachycardia.  We have optimized all of your medications to avoid these rhythms.  Please continue all the medications as we have written in the information below.  We have recommended stopping some medications because they increased risk of torsades however if they are within your goals of care then you may restart them as you wish.   POST-HOSPITAL & CARE INSTRUCTIONS Follow-up with cardiology and nephrology Follow-up with palliative care team Please let PCP/Specialists know of any changes that were made.  Please see medications section of this packet for any medication changes.   DOCTOR'S APPOINTMENT & FOLLOW UP CARE INSTRUCTIONS  No future appointments.    RETURN PRECAUTIONS: If you start to feel disoriented, nauseous, vomiting, passing out  Take care and be well!  Family Medicine Teaching Service  [place]  [facility]  [address] [place], [place] [address] [phone]

## 2023-01-18 NOTE — Progress Notes (Signed)
[facility] Patient Discharge Orders- [facility] CLINIC: east  Patient's name: Robin [name] Admit/DC Dates: 01/15/2023 - 01/18/2023  Discharge Diagnoses: Chest pain   N/V/D  = viral / ?uremia 2/2 shortened txs   Vas congestion on CXR  lower edw  runs of Vtach for 20-30 beats while in and out of dialysis =   Decr BFR 300 AMS= / uremia ,  ALSO MAde DNR no Aranesp: Given: no   Date and amount of last dose: 00  Last Hgb: 0 PRBC's Given: 0 Date/# of units: 0 ESA dose for discharge: mircera 0 mcg IV q 2 weeks  IV Iron dose at discharge: no  Heparin change: no  EDW Change: lower New EDW: 54.5  Bath Change: no  Access intervention/Change: no Details:  Hectorol/Calcitriol change: no  Discharge Labs: Calcium9.7 Phosphorus 7.6 Albumin 3.9 K+ 4.9  IV Antibiotics: no Details:  On Coumadin?: no Last INR: Next INR: Managed By:   OTHER/APPTS/LAB ORDERS:    D/C Meds to be reconciled by nurse after every discharge.  Completed By:   Reviewed by: MD:______ RN_______

## 2023-01-18 NOTE — TOC Transition Note (Signed)
Transition of Care [facility]) - CM/SW Discharge Note   Patient Details  Name: Robin [name] MRN: [id number] Date of Birth: 10/10/1950  [facility] CM/SW Contact:  Lawerance Sabal, RN Phone Number: 01/18/2023, 10:05 AM   Clinical Narrative:     Spoke to patient and family at bedside. They state patient has services with [facility] 6 hours a day. They would like to have [facility] services restarted with [facility]. Referral given to [place] and they were directed to get auth from the [place]. Requested HH PT OT with face to face order be placed from the attending [place].  Family confirms patient has all needed DME at home and they will provide transportation home   Final next level of care: Home w Home Health Services Barriers to Discharge: No Barriers Identified   Patient Goals and CMS Choice CMS Medicare.gov Compare Post Acute Care list provided to:: Patient Choice offered to / list presented to : Patient  Discharge Placement                         Discharge Plan and Services Additional resources added to the After Visit Summary for                  DME Arranged: N/A         HH Arranged: PT, OT HH Agency: [facility] Date [facility] Agency Contacted: 01/18/23 Time HH Agency Contacted: 1005 Representative spoke with at [facility] Agency: Laurelyn Sickle  Social Determinants of Health (SDOH) Interventions SDOH Screenings   Food Insecurity: No Food Insecurity (01/15/2023)  Housing: Patient Declined (01/15/2023)  Transportation Needs: No Transportation Needs (01/15/2023)  Utilities: Not At Risk (01/15/2023)  Tobacco Use: High Risk (01/15/2023)     Readmission Risk Interventions     No data to display

## 2023-01-18 NOTE — Progress Notes (Addendum)
Daily Progress Note   [name]       Date: 01/18/2023 DOB: May 10, 1951  Age: 72 y.o. MRN#: [id number] Attending Physician: Caro Laroche, DO Primary Care Physician: [facility] Admit Date: 01/15/2023  Reason for Consultation/Follow-up: Establishing goals of care, "code status"  Subjective: I have reviewed medical records including EPIC notes, MAR, and labs. Received report from primary RN - no acute concerns. RN reports patient's daughter is at bedside and had a lot of questions for medical team. RN notified [facility] of daughter's request to speak with them.  Discussed case with Dr. Marsh Dolly - she has been to patient's bedside to answer daughter's questions. She has also discussed code status with daughter and patient - patient and daughter have opted for DNR/DNI. Patient is also already followed by outpatient Palliative Care and family/patient plan for continued outpatient support. Per Dr. Marsh Dolly, there are no further PMT needs at this time.  Durable DNR placed on chart.   Attempted to locate outpatient Palliative Care organization in chart - unable to locate.  Length of Stay: 3  Current Medications: Scheduled Meds:   amLODipine  5 mg Oral Daily   apixaban  2.5 mg Oral BID WC   atorvastatin  80 mg Oral QHS   calcium carbonate  1,250 mg Oral BID WC   Chlorhexidine Gluconate Cloth  6 each Topical Daily   clopidogrel  75 mg Oral Daily   isosorbide mononitrate  30 mg Oral Q breakfast   melatonin  10 mg Oral QHS   metoprolol succinate  25 mg Oral Daily   multivitamin  1 tablet Oral QHS   pantoprazole  40 mg Oral Daily   umeclidinium bromide  1 puff Inhalation Daily    Continuous Infusions:   PRN Meds: nitroGLYCERIN, trimethobenzamide      Vital Signs:  BP 133/78 (BP Location: Right Arm)   Pulse 65   Temp 97.7 F (36.5 C) (Oral)   Resp 18   Ht 5\' 4"  (1.626 m)   Wt 54.3 kg   SpO2 100%   BMI 20.55 kg/m  SpO2: SpO2: 100 % O2 Device: O2 Device: Room Air O2 Flow Rate: O2 Flow Rate (L/min): 2 L/min  Intake/output summary:  Intake/Output Summary (Last 24 hours) at 01/18/2023 0851 Last data filed at 01/17/2023 1700 Gross  per 24 hour  Intake 360 ml  Output --  Net 360 ml   LBM:   Baseline Weight: Weight: 61.2 kg Most recent weight: Weight: 54.3 kg       Palliative Assessment/Data:      Patient Active Problem List   Diagnosis Date Noted   QT prolongation 01/17/2023   Nausea & vomiting 01/15/2023   Hidradenitis 11/20/2022   Muscle weakness (generalized) 11/20/2022   Need for assistance with personal care 11/20/2022   Other acute osteomyelitis, right ankle and foot (HCC) 11/20/2022   Transient neurologic deficit 10/20/2022   Electrolyte abnormality 10/19/2022   Adjustment disorder 10/18/2022   Hypotension 10/17/2022   Hyponatremia 10/17/2022   Malnutrition of moderate degree 08/05/2022   NSTEMI (non-ST elevated myocardial infarction) (HCC) 07/29/2022   PAC (premature atrial contraction) 07/29/2022   Hyperlipidemia with target LDL less than 70 07/29/2022   Osteomyelitis, unspecified (HCC) 12/10/2021   Transient altered mental status 07/27/2020   Hypercalcemia 02/01/2020   Major depressive disorder, recurrent, unspecified (HCC) 01/25/2020   COVID-19 01/10/2020   Hypoxia 01/10/2020   History of pulmonary embolism 10/01/2019   Prolonged QT interval 10/01/2019   Hypertensive urgency    Other headache syndrome    Orthostatic dizziness 04/15/2019   Bradycardia 04/15/2019   Syncope    ESRD (end stage renal disease) (HCC)    Iron deficiency anemia, unspecified 10/20/2016   Diabetic Charct's arthropathy (HCC) 10/07/2016   GERD (gastroesophageal reflux disease) 09/09/2016   Depression 09/09/2016   Congestive heart failure  (CHF) (HCC) 09/09/2016   Elevated troponin 09/09/2016   Hyperlipidemia associated with type 2 diabetes mellitus (HCC) 03/26/2015   Charcot ankle 03/16/2015   Anemia, chronic renal failure    NSVT (nonsustained ventricular tachycardia) (HCC)    Hypertension due to end stage renal disease caused by type 2 diabetes mellitus, on dialysis (HCC) 12/09/2014   Bipolar affective disorder (HCC) 12/09/2014   Diabetes mellitus type 2, insulin dependent (HCC) 11/17/2014   CAD (coronary artery disease), native coronary artery with 2 stents  11/17/2014    Palliative Care Assessment & Plan   Patient Profile: 72 y.o. female  with past medical history of end-stage renal disease on dialysis, PAD and CAD with prior circumflex stenting in 2016, recent chest pain and NSTEMI in March 2024 with moderate nonobstructive coronary artery disease by cath was admitted on 01/15/2023 with hypertension due to ESRD on HD, transient AMS, and nausea/vomiting/diarrhea.   Assessment: Active Problems:   Hypertension due to end stage renal disease caused by type 2 diabetes mellitus, on dialysis (HCC)   Congestive heart failure (CHF) (HCC)   ESRD (end stage renal disease) (HCC)   Transient altered mental status   Nausea & vomiting   QT prolongation   Recommendations/Plan: Agree with DNR/DNI - durable DNR form completed and placed in shadow chart. Copy was made and will be scanned into Vynca/ACP tab Patient is already followed by outpatient Palliative Care - plan is for continued follow up PMT will continue to follow peripherally. If there are any imminent needs please call the service directly  Goals of Care and Additional Recommendations: Limitations on Scope of Treatment: Full Scope Treatment  Code Status:    Code Status Orders  (From admission, onward)           Start     Ordered   01/15/23 2007  Full code  Continuous       Question:  By:  Answer:  Consent: discussion documented in EHR   01/15/23  2030            Code Status History     Date Active Date Inactive Code Status Order ID Comments User Context   10/17/2022 1326 10/21/2022 1732 Full Code [id number]  Alfredo Martinez, MD ED   07/29/2022 0847 08/06/2022 0103 Full Code [id number]  Belva Agee, MD ED   03/21/2021 1337 03/21/2021 2047 Full Code [id number]  Cephus Shelling, MD Inpatient   07/27/2020 1529 07/28/2020 2352 Full Code [id number]  Claudean Severance, MD ED   01/10/2020 2322 02/01/2020 0152 Full Code [id number]  Chotiner, Claudean Severance, MD ED   10/01/2019 0405 10/01/2019 1856 Full Code [id number]  Briscoe Deutscher, MD Inpatient   04/14/2019 1505 04/17/2019 2003 Full Code [id number]  Myrene Buddy, MD ED   09/16/2018 1440 09/17/2018 1854 Full Code [id number]  Lorenso Courier, MD ED   12/26/2017 1901 12/29/2017 1714 Full Code [id number]  Claudean Severance, MD Inpatient   12/26/2017 1711 12/26/2017 1901 DNR [id number]  Eulah Pont, MD Inpatient   12/26/2017 1459 12/26/2017 1711 Full Code [id number]  Eulah Pont, MD ED   09/09/2016 0135 09/15/2016 2022 Full Code [id number]  Lorretta Harp, MD ED   08/08/2016 0311 08/12/2016 1659 Full Code [id number]  Eduard Clos, MD Inpatient   03/13/2016 1756 03/17/2016 1528 Full Code [id number]  Osvaldo Shipper, MD Inpatient   11/29/2015 2050 12/05/2015 1523 Full Code [id number]  Briscoe Deutscher, MD ED   03/16/2015 1647 03/20/2015 1642 Full Code [id number]  Nadara Mustard, MD Inpatient   01/21/2015 0754 01/23/2015 1555 Full Code [id number]  Stephani Police, PA-C Inpatient   11/20/2014 1814 11/22/2014 1913 Full Code [id number]  Janalee Dane, PA-C Inpatient   11/17/2014 1720 11/20/2014 1814 Full Code [id number]  Christiane Ha, MD Inpatient   11/17/2014 1712 11/17/2014 1720 Full Code [id number]  Margart Sickles, PA-C Inpatient       Prognosis:  Unable to determine  Discharge Planning: To Be Determined  Care plan was discussed with primary RN, Dr. Marsh Dolly  Thank you for allowing the Palliative Medicine Team to assist in the care  of this patient.   Total Time 25 minutes Prolonged Time Billed  no       Greater than 50%  of this time was spent counseling and coordinating care related to the above assessment and plan.  Haskel Khan, NP  Please contact Palliative Medicine Team phone at [phone] for questions and concerns.   *Portions of this note are a verbal dictation therefore any spelling and/or grammatical errors are due to the "Dragon Medical One" system interpretation.

## 2023-01-19 ENCOUNTER — Other Ambulatory Visit (HOSPITAL_COMMUNITY): Payer: Self-pay

## 2023-01-19 NOTE — Progress Notes (Signed)
Late Note Entry- January 19, 2023  Pt was d/c yesterday. Contacted [facility] this morning to advise clinic of pt's d/c date and that pt should have resumed care this morning.   Olivia Canter Renal Navigator [phone]

## 2023-03-12 ENCOUNTER — Emergency Department (HOSPITAL_COMMUNITY): Payer: No Typology Code available for payment source

## 2023-03-12 ENCOUNTER — Inpatient Hospital Stay (HOSPITAL_COMMUNITY)
Admission: EM | Admit: 2023-03-12 | Discharge: 2023-03-27 | DRG: 480 | Disposition: E | Payer: No Typology Code available for payment source | Attending: Family Medicine | Admitting: Family Medicine

## 2023-03-12 ENCOUNTER — Other Ambulatory Visit: Payer: Self-pay

## 2023-03-12 DIAGNOSIS — D631 Anemia in chronic kidney disease: Secondary | ICD-10-CM | POA: Diagnosis present

## 2023-03-12 DIAGNOSIS — R4701 Aphasia: Secondary | ICD-10-CM | POA: Diagnosis present

## 2023-03-12 DIAGNOSIS — F17210 Nicotine dependence, cigarettes, uncomplicated: Secondary | ICD-10-CM | POA: Diagnosis not present

## 2023-03-12 DIAGNOSIS — G40901 Epilepsy, unspecified, not intractable, with status epilepticus: Secondary | ICD-10-CM | POA: Diagnosis not present

## 2023-03-12 DIAGNOSIS — I482 Chronic atrial fibrillation, unspecified: Secondary | ICD-10-CM | POA: Diagnosis present

## 2023-03-12 DIAGNOSIS — J449 Chronic obstructive pulmonary disease, unspecified: Secondary | ICD-10-CM | POA: Diagnosis present

## 2023-03-12 DIAGNOSIS — W010XXA Fall on same level from slipping, tripping and stumbling without subsequent striking against object, initial encounter: Secondary | ICD-10-CM | POA: Diagnosis present

## 2023-03-12 DIAGNOSIS — I4901 Ventricular fibrillation: Secondary | ICD-10-CM | POA: Diagnosis not present

## 2023-03-12 DIAGNOSIS — I639 Cerebral infarction, unspecified: Secondary | ICD-10-CM | POA: Diagnosis not present

## 2023-03-12 DIAGNOSIS — I2724 Chronic thromboembolic pulmonary hypertension: Secondary | ICD-10-CM | POA: Diagnosis present

## 2023-03-12 DIAGNOSIS — I480 Paroxysmal atrial fibrillation: Secondary | ICD-10-CM | POA: Diagnosis present

## 2023-03-12 DIAGNOSIS — E1122 Type 2 diabetes mellitus with diabetic chronic kidney disease: Secondary | ICD-10-CM | POA: Diagnosis present

## 2023-03-12 DIAGNOSIS — Z515 Encounter for palliative care: Secondary | ICD-10-CM | POA: Diagnosis not present

## 2023-03-12 DIAGNOSIS — I6350 Cerebral infarction due to unspecified occlusion or stenosis of unspecified cerebral artery: Secondary | ICD-10-CM | POA: Diagnosis not present

## 2023-03-12 DIAGNOSIS — N186 End stage renal disease: Secondary | ICD-10-CM | POA: Diagnosis present

## 2023-03-12 DIAGNOSIS — F319 Bipolar disorder, unspecified: Secondary | ICD-10-CM | POA: Diagnosis present

## 2023-03-12 DIAGNOSIS — E1165 Type 2 diabetes mellitus with hyperglycemia: Secondary | ICD-10-CM | POA: Diagnosis present

## 2023-03-12 DIAGNOSIS — Y92000 Kitchen of unspecified non-institutional (private) residence as  the place of occurrence of the external cause: Secondary | ICD-10-CM | POA: Diagnosis not present

## 2023-03-12 DIAGNOSIS — E785 Hyperlipidemia, unspecified: Secondary | ICD-10-CM | POA: Diagnosis present

## 2023-03-12 DIAGNOSIS — N2581 Secondary hyperparathyroidism of renal origin: Secondary | ICD-10-CM | POA: Diagnosis present

## 2023-03-12 DIAGNOSIS — Z8674 Personal history of sudden cardiac arrest: Secondary | ICD-10-CM

## 2023-03-12 DIAGNOSIS — Z79899 Other long term (current) drug therapy: Secondary | ICD-10-CM

## 2023-03-12 DIAGNOSIS — Z7189 Other specified counseling: Secondary | ICD-10-CM | POA: Diagnosis not present

## 2023-03-12 DIAGNOSIS — I959 Hypotension, unspecified: Secondary | ICD-10-CM | POA: Diagnosis not present

## 2023-03-12 DIAGNOSIS — I4891 Unspecified atrial fibrillation: Secondary | ICD-10-CM | POA: Diagnosis not present

## 2023-03-12 DIAGNOSIS — K219 Gastro-esophageal reflux disease without esophagitis: Secondary | ICD-10-CM | POA: Diagnosis present

## 2023-03-12 DIAGNOSIS — Z96642 Presence of left artificial hip joint: Secondary | ICD-10-CM | POA: Diagnosis present

## 2023-03-12 DIAGNOSIS — F0393 Unspecified dementia, unspecified severity, with mood disturbance: Secondary | ICD-10-CM | POA: Diagnosis present

## 2023-03-12 DIAGNOSIS — I63341 Cerebral infarction due to thrombosis of right cerebellar artery: Secondary | ICD-10-CM | POA: Diagnosis present

## 2023-03-12 DIAGNOSIS — Z86711 Personal history of pulmonary embolism: Secondary | ICD-10-CM

## 2023-03-12 DIAGNOSIS — Z7902 Long term (current) use of antithrombotics/antiplatelets: Secondary | ICD-10-CM

## 2023-03-12 DIAGNOSIS — R569 Unspecified convulsions: Principal | ICD-10-CM

## 2023-03-12 DIAGNOSIS — I472 Ventricular tachycardia, unspecified: Secondary | ICD-10-CM | POA: Diagnosis not present

## 2023-03-12 DIAGNOSIS — Z86718 Personal history of other venous thrombosis and embolism: Secondary | ICD-10-CM

## 2023-03-12 DIAGNOSIS — Z66 Do not resuscitate: Secondary | ICD-10-CM | POA: Diagnosis present

## 2023-03-12 DIAGNOSIS — Z7951 Long term (current) use of inhaled steroids: Secondary | ICD-10-CM

## 2023-03-12 DIAGNOSIS — E11649 Type 2 diabetes mellitus with hypoglycemia without coma: Secondary | ICD-10-CM | POA: Diagnosis not present

## 2023-03-12 DIAGNOSIS — I5022 Chronic systolic (congestive) heart failure: Secondary | ICD-10-CM | POA: Diagnosis not present

## 2023-03-12 DIAGNOSIS — G40911 Epilepsy, unspecified, intractable, with status epilepticus: Secondary | ICD-10-CM | POA: Diagnosis not present

## 2023-03-12 DIAGNOSIS — I132 Hypertensive heart and chronic kidney disease with heart failure and with stage 5 chronic kidney disease, or end stage renal disease: Secondary | ICD-10-CM | POA: Diagnosis present

## 2023-03-12 DIAGNOSIS — E1151 Type 2 diabetes mellitus with diabetic peripheral angiopathy without gangrene: Secondary | ICD-10-CM | POA: Diagnosis present

## 2023-03-12 DIAGNOSIS — Z90710 Acquired absence of both cervix and uterus: Secondary | ICD-10-CM

## 2023-03-12 DIAGNOSIS — Z955 Presence of coronary angioplasty implant and graft: Secondary | ICD-10-CM

## 2023-03-12 DIAGNOSIS — E43 Unspecified severe protein-calorie malnutrition: Secondary | ICD-10-CM | POA: Diagnosis present

## 2023-03-12 DIAGNOSIS — Z981 Arthrodesis status: Secondary | ICD-10-CM

## 2023-03-12 DIAGNOSIS — I5042 Chronic combined systolic (congestive) and diastolic (congestive) heart failure: Secondary | ICD-10-CM | POA: Diagnosis present

## 2023-03-12 DIAGNOSIS — I252 Old myocardial infarction: Secondary | ICD-10-CM

## 2023-03-12 DIAGNOSIS — Z992 Dependence on renal dialysis: Secondary | ICD-10-CM | POA: Diagnosis not present

## 2023-03-12 DIAGNOSIS — S72002A Fracture of unspecified part of neck of left femur, initial encounter for closed fracture: Principal | ICD-10-CM

## 2023-03-12 DIAGNOSIS — R1310 Dysphagia, unspecified: Secondary | ICD-10-CM | POA: Diagnosis present

## 2023-03-12 DIAGNOSIS — S72142A Displaced intertrochanteric fracture of left femur, initial encounter for closed fracture: Principal | ICD-10-CM | POA: Diagnosis present

## 2023-03-12 DIAGNOSIS — Z7901 Long term (current) use of anticoagulants: Secondary | ICD-10-CM

## 2023-03-12 DIAGNOSIS — I2510 Atherosclerotic heart disease of native coronary artery without angina pectoris: Secondary | ICD-10-CM | POA: Diagnosis not present

## 2023-03-12 DIAGNOSIS — Z6820 Body mass index (BMI) 20.0-20.9, adult: Secondary | ICD-10-CM

## 2023-03-12 DIAGNOSIS — I6389 Other cerebral infarction: Secondary | ICD-10-CM | POA: Diagnosis not present

## 2023-03-12 DIAGNOSIS — E875 Hyperkalemia: Secondary | ICD-10-CM | POA: Diagnosis present

## 2023-03-12 DIAGNOSIS — Z89412 Acquired absence of left great toe: Secondary | ICD-10-CM

## 2023-03-12 DIAGNOSIS — G4733 Obstructive sleep apnea (adult) (pediatric): Secondary | ICD-10-CM | POA: Diagnosis present

## 2023-03-12 LAB — CBC WITH DIFFERENTIAL/PLATELET
Abs Immature Granulocytes: 0.04 10*3/uL (ref 0.00–0.07)
Basophils Absolute: 0 10*3/uL (ref 0.0–0.1)
Basophils Relative: 0 %
Eosinophils Absolute: 0 10*3/uL (ref 0.0–0.5)
Eosinophils Relative: 1 %
HCT: 31.4 % — ABNORMAL LOW (ref 36.0–46.0)
Hemoglobin: 10 g/dL — ABNORMAL LOW (ref 12.0–15.0)
Immature Granulocytes: 1 %
Lymphocytes Relative: 9 %
Lymphs Abs: 0.8 10*3/uL (ref 0.7–4.0)
MCH: 30.6 pg (ref 26.0–34.0)
MCHC: 31.8 g/dL (ref 30.0–36.0)
MCV: 96 fL (ref 80.0–100.0)
Monocytes Absolute: 0.7 10*3/uL (ref 0.1–1.0)
Monocytes Relative: 8 %
Neutro Abs: 6.8 10*3/uL (ref 1.7–7.7)
Neutrophils Relative %: 81 %
Platelets: 178 10*3/uL (ref 150–400)
RBC: 3.27 MIL/uL — ABNORMAL LOW (ref 3.87–5.11)
RDW: 18.2 % — ABNORMAL HIGH (ref 11.5–15.5)
WBC: 8.4 10*3/uL (ref 4.0–10.5)
nRBC: 0 % (ref 0.0–0.2)

## 2023-03-12 LAB — BASIC METABOLIC PANEL
Anion gap: 15 (ref 5–15)
BUN: 15 mg/dL (ref 8–23)
CO2: 25 mmol/L (ref 22–32)
Calcium: 9.6 mg/dL (ref 8.9–10.3)
Chloride: 100 mmol/L (ref 98–111)
Creatinine, Ser: 5 mg/dL — ABNORMAL HIGH (ref 0.44–1.00)
GFR, Estimated: 9 mL/min — ABNORMAL LOW (ref >60)
Glucose, Bld: 157 mg/dL — ABNORMAL HIGH (ref 70–99)
Potassium: 4 mmol/L (ref 3.5–5.1)
Sodium: 140 mmol/L (ref 135–145)

## 2023-03-12 LAB — I-STAT CHEM 8, ED
BUN: 15 mg/dL (ref 8–23)
Calcium, Ion: 1.07 mmol/L — ABNORMAL LOW (ref 1.15–1.40)
Chloride: 101 mmol/L (ref 98–111)
Creatinine, Ser: 5.5 mg/dL — ABNORMAL HIGH (ref 0.44–1.00)
Glucose, Bld: 155 mg/dL — ABNORMAL HIGH (ref 70–99)
HCT: 32 % — ABNORMAL LOW (ref 36.0–46.0)
Hemoglobin: 10.9 g/dL — ABNORMAL LOW (ref 12.0–15.0)
Potassium: 3.8 mmol/L (ref 3.5–5.1)
Sodium: 137 mmol/L (ref 135–145)
TCO2: 25 mmol/L (ref 22–32)

## 2023-03-12 LAB — PROTIME-INR
INR: 1.2 (ref 0.8–1.2)
Prothrombin Time: 15.3 s — ABNORMAL HIGH (ref 11.4–15.2)

## 2023-03-12 LAB — GLUCOSE, CAPILLARY: Glucose-Capillary: 152 mg/dL — ABNORMAL HIGH (ref 70–99)

## 2023-03-12 LAB — APTT: aPTT: 35 s (ref 24–36)

## 2023-03-12 MED ORDER — HEPARIN SODIUM (PORCINE) 5000 UNIT/ML IJ SOLN: 5000.0000 [IU] | Freq: Three times a day (TID) | INTRAMUSCULAR | Status: DC

## 2023-03-12 MED ORDER — MELATONIN 5 MG PO TABS
5.0000 mg | ORAL_TABLET | Freq: Every evening | ORAL | Status: DC | PRN
  Filled 2023-03-12: qty 1

## 2023-03-12 MED ORDER — RENA-VITE PO TABS
1.0000 | ORAL_TABLET | Freq: Every day | ORAL | Status: DC
  Administered 2023-03-12 – 2023-03-14 (×2): 1 via ORAL
  Filled 2023-03-12 (×3): qty 1

## 2023-03-12 MED ORDER — INSULIN ASPART 100 UNIT/ML IJ SOLN
0.0000 [IU] | Freq: Three times a day (TID) | INTRAMUSCULAR | Status: DC
  Administered 2023-03-13 – 2023-03-14 (×2): 1 [IU] via SUBCUTANEOUS
  Administered 2023-03-14: 3 [IU] via SUBCUTANEOUS
  Administered 2023-03-15 (×3): 2 [IU] via SUBCUTANEOUS

## 2023-03-12 MED ORDER — LABETALOL HCL 5 MG/ML IV SOLN: 5.0000 mg | INTRAVENOUS | Status: DC | PRN

## 2023-03-12 MED ORDER — UMECLIDINIUM BROMIDE 62.5 MCG/ACT IN AEPB
1.0000 | INHALATION_SPRAY | Freq: Every day | RESPIRATORY_TRACT | Status: DC
  Administered 2023-03-15: 1 via RESPIRATORY_TRACT
  Filled 2023-03-12: qty 7

## 2023-03-12 MED ORDER — PROCHLORPERAZINE EDISYLATE 10 MG/2ML IJ SOLN: 5.0000 mg | Freq: Four times a day (QID) | INTRAMUSCULAR | Status: DC | PRN

## 2023-03-12 MED ORDER — HYDROMORPHONE HCL 1 MG/ML IJ SOLN
0.5000 mg | INTRAMUSCULAR | Status: DC | PRN
  Administered 2023-03-12: 0.5 mg via INTRAVENOUS
  Filled 2023-03-12: qty 1

## 2023-03-12 MED ORDER — ACETAMINOPHEN 325 MG PO TABS
650.0000 mg | ORAL_TABLET | Freq: Four times a day (QID) | ORAL | Status: DC | PRN
  Administered 2023-03-15: 650 mg via ORAL
  Filled 2023-03-12 (×2): qty 2

## 2023-03-12 MED ORDER — PANTOPRAZOLE SODIUM 40 MG PO TBEC
40.0000 mg | DELAYED_RELEASE_TABLET | Freq: Every day | ORAL | Status: DC
  Administered 2023-03-12 – 2023-03-14 (×2): 40 mg via ORAL
  Filled 2023-03-12 (×3): qty 1

## 2023-03-12 MED ORDER — TIOTROPIUM BROMIDE MONOHYDRATE 1.25 MCG/ACT IN AERS: 2.0000 | INHALATION_SPRAY | Freq: Every morning | RESPIRATORY_TRACT | Status: DC

## 2023-03-12 MED ORDER — INSULIN ASPART 100 UNIT/ML IJ SOLN: 0.0000 [IU] | Freq: Every day | INTRAMUSCULAR | Status: DC

## 2023-03-12 MED ORDER — POLYETHYLENE GLYCOL 3350 17 G PO PACK: 17.0000 g | PACK | Freq: Every day | ORAL | Status: DC | PRN

## 2023-03-12 MED ORDER — HYDROMORPHONE HCL 1 MG/ML IJ SOLN
0.5000 mg | INTRAMUSCULAR | Status: DC | PRN
  Administered 2023-03-12: 0.5 mg via INTRAVENOUS
  Filled 2023-03-12: qty 0.5

## 2023-03-12 MED ORDER — ALBUTEROL SULFATE (2.5 MG/3ML) 0.083% IN NEBU: 3.0000 mL | INHALATION_SOLUTION | Freq: Four times a day (QID) | RESPIRATORY_TRACT | Status: DC | PRN

## 2023-03-12 MED ORDER — OXYCODONE HCL 5 MG PO TABS: 5.0000 mg | ORAL_TABLET | Freq: Four times a day (QID) | ORAL | Status: DC | PRN

## 2023-03-12 NOTE — ED Triage Notes (Signed)
Patient BIB EMS today from home with a fall. Patient is currently on plavix and eliquis. Patient states she was in kitchen reaching for peanut butter and lost her balance and fell and hit back of head. Patietn did not have LOC. Left hip def noted by EMS.   22 lt AC   of fent

## 2023-03-12 NOTE — Consult Note (Signed)
Orthopedic Surgery Consult Note  Assessment: Patient is a 72 y.o. female with left pertrochanteric femur fracture   Plan: -Tentatively planning for operative fixation tomorrow evening -Okay for diet, NPO after 9am tomorrow -Hold  -Ancef and TXA on call to OR -Weight bearing status: NWB LLE -PT/OT evaluate and treat -Pain control -Dispo: pending completion of operative plans   Discussed recommendation for operative intervention in the form of left pertrochanteric hip fracture open reduction internal fixation with cephalomedullary rod. Explained the risks of this procedure included, but were not limited to: nonunion, malunion, hardware failure, infection, bleeding, stiffness, screw cut out, need for additional procedures, deep vein thrombosis, pulmonary embolism, and death. The benefits of this procedure would be to promote fracture healing by providing stability and to allow for early mobilization. The alternatives of this surgery would be to treat the fracture with traction or to do no intervention. The patient's questions were answered to her satisfaction. I also spoke to the patient's daughter and POA, Robin [name],on the phone. I repeated the same information above to Robin [name]. After going over this with both the patient and [name] (separately) both elected to proceed with surgery. Informed consent was obtained.   ___________________________________________________________________________   Reason for consult: left hip fracture  History:  Patient is a 72 y.o. female who was reaching for some peanut butter when she lost her balance and fell. She landed on her left side. She was unable to ambulate. She was brought [facility] ED and was found to have a left hip fracture. She was admitted to medicine. She reports pain in her left hip but no other extremity pain. Reports decreased sensation distal to her mid shaft tibias. No other numbness or paresthesias.    Past Medical History:  Diagnosis Date    Acute delirium 11/18/2014   Acute encephalopathy    Acute on chronic respiratory failure with hypoxia (HCC) 09/09/2016   Acute respiratory failure with hypoxia (HCC) 11/29/2015   Anemia in chronic kidney disease 12/09/2014   Anxiety    Arthritis    Benign hypertension    Bipolar affective disorder (HCC) 12/09/2014   CAD (coronary artery disease), native coronary artery with 2 stents  11/17/2014   Charcot foot due to diabetes mellitus (HCC)    Chronic combined systolic and diastolic CHF (congestive heart failure) (HCC) 11/18/2014   Closed left ankle fracture 11/17/2014   Confusion 01/21/2015   Depression    Diabetes mellitus without complication (HCC)    End stage renal disease on dialysis (HCC)    Fracture dislocation of ankle 11/17/2014   GERD (gastroesophageal reflux disease)    Heart murmur    History of blood transfusion    History of bronchitis    History of pneumonia    Hyperlipidemia 03/26/2015   Hypertension associated with diabetes (HCC) 11/18/2014   Hypertensive heart/renal disease with failure (HCC) 12/09/2014   Hypokalemia 11/17/2014   Multiple falls 01/21/2015   Nausea & vomiting 01/15/2023   Obesity 11/17/2014   Onychomycosis 10/07/2016   Right leg DVT (HCC) 12/05/2015   SIRS (systemic inflammatory response syndrome) (HCC) 08/08/2016   Sleep apnea    Unstable angina (HCC) 12/26/2017   Ventricular tachycardia (HCC)      Allergies: NKDA   Past Surgical History:  Procedure Laterality Date   A/V FISTULAGRAM N/A 01/03/2020   Procedure: A/V FISTULAGRAM;  Surgeon: Nada Libman, MD;  Location: [facility];  Service: Cardiovascular;  Laterality: N/A;   A/V FISTULAGRAM Left 03/12/2021   Procedure: A/V  FISTULAGRAM;  Surgeon: Nada Libman, MD;  Location: [facility];  Service: Cardiovascular;  Laterality: Left;   ABDOMINAL HYSTERECTOMY     ANGIOPLASTY Left 01/26/2020   Procedure: LEFT ARM GRAPH THROMBECTOMY WITH BALLON ANGIOPLASTY;  Surgeon: Maeola Harman,  MD;  Location: [name] OR;  Service: Vascular;  Laterality: Left;   ANKLE CLOSED REDUCTION N/A 11/17/2014   Procedure: CLOSED REDUCTION ANKLE;  Surgeon: Frederico Hamman, MD;  Location: [facility] OR;  Service: Orthopedics;  Laterality: N/A;   ANKLE FUSION Left 03/16/2015   Procedure: Left Tibiocalcaneal Fusion;  Surgeon: Nadara Mustard, MD;  Location: [facility] OR;  Service: Orthopedics;  Laterality: Left;   APPLICATION OF WOUND VAC Right 08/06/2016   Procedure: APPLICATION OF PREVENA WOUND VAC;  Surgeon: Nadara Mustard, MD;  Location: [facility] OR;  Service: Orthopedics;  Laterality: Right;   AV FISTULA PLACEMENT Left may-2016   done at [facility]   CARDIAC CATHETERIZATION     2 stent    CHOLECYSTECTOMY     CORONARY PRESSURE/FFR STUDY N/A 07/31/2022   Procedure: INTRAVASCULAR PRESSURE WIRE/FFR STUDY;  Surgeon: Orbie Pyo, MD;  Location: [facility];  Service: Cardiovascular;  Laterality: N/A;   CORONARY STENT PLACEMENT     FISTULOGRAM Left 01/26/2020   Procedure: FISTULOGRAM;  Surgeon: Maeola Harman, MD;  Location: [facility] OR;  Service: Vascular;  Laterality: Left;   FOOT ARTHRODESIS Right 08/06/2016   Procedure: Right Foot Fusion Lisfranc Joint;  Surgeon: Nadara Mustard, MD;  Location: [facility] OR;  Service: Orthopedics;  Laterality: Right;   HARDWARE REMOVAL Left 03/16/2015   Procedure: Removal Hardware Left Ankle;  Surgeon: Nadara Mustard, MD;  Location[facility] OR;  Service: Orthopedics;  Laterality: Left;   IR AV DIALY SHUNT INTRO NEEDLE/INTRACATH INITIAL W/PTA/IMG LEFT  01/05/2018   IR AV DIALY SHUNT INTRO NEEDLE/INTRACATH INITIAL W/PTA/IMG LEFT  10/18/2022   IR FLUORO GUIDE CV LINE RIGHT  10/18/2022   IR US GUIDE VASC ACCESS LEFT  01/05/2018   IR US GUIDE VASC ACCESS RIGHT  10/18/2022   LEFT HEART CATH AND CORONARY ANGIOGRAPHY N/A 12/28/2017   Procedure: LEFT HEART CATH AND CORONARY ANGIOGRAPHY;  Surgeon: Kathleene Hazel, MD;  Location: [facility];  Service: Cardiovascular;  Laterality: N/A;   LEFT  HEART CATH AND CORONARY ANGIOGRAPHY N/A 07/31/2022   Procedure: LEFT HEART CATH AND CORONARY ANGIOGRAPHY;  Surgeon: Orbie Pyo, MD;  Location: [facility];  Service: Cardiovascular;  Laterality: N/A;   LOWER EXTREMITY ANGIOGRAPHY N/A 03/21/2021   Procedure: LOWER EXTREMITY ANGIOGRAPHY;  Surgeon: Cephus Shelling, MD;  Location: [facility];  Service: Cardiovascular;  Laterality: N/A;   ORIF ANKLE FRACTURE Left 11/20/2014   Procedure: OPEN REDUCTION INTERNAL FIXATION (ORIF) ANKLE FRACTURE;  Surgeon: Sheral Apley, MD;  Location: [facility] OR;  Service: Orthopedics;  Laterality: Left;   PERIPHERAL VASCULAR BALLOON ANGIOPLASTY  01/03/2020   Procedure: PERIPHERAL VASCULAR BALLOON ANGIOPLASTY;  Surgeon: Nada Libman, MD;  Location: [facility];  Service: Cardiovascular;;   PERIPHERAL VASCULAR BALLOON ANGIOPLASTY Left 03/12/2021   Procedure: PERIPHERAL VASCULAR BALLOON ANGIOPLASTY;  Surgeon: Nada Libman, MD;  Location: [facility];  Service: Cardiovascular;  Laterality: Left;   PERIPHERAL VASCULAR BALLOON ANGIOPLASTY  08/22/2021   Procedure: PERIPHERAL VASCULAR BALLOON ANGIOPLASTY;  Surgeon: Cephus Shelling, MD;  Location: [facility];  Service: Cardiovascular;;  Left AVF PTA   TONSILLECTOMY      Social history: Reports  use of nicotine-containing products (cigarettes, vaping, smokeless, etc.) Alcohol use: denies Denies use of recreational drugs   Physical Exam:  General: no acute distress, appears stated age Neurologic: alert, answering questions appropriately, following commands Cardiovascular: regular rate, no cyanosis Respiratory: unlabored breathing on nasal canula, symmetric chest rise Psychiatric: appropriate affect, normal cadence to speech  MSK:   -Bilateral upper extremities  No tenderness to palpation over extremity, no gross deformity, no open wounds Fires deltoid, biceps, triceps, wrist extensors, wrist flexors, finger  extensors, finger flexors  AIN/PIN/IO intact  Palpable radial pulse  Sensation intact to light touch in median/ulnar/radial/axillary nerve distributions  Hand warm and well perfused  -Bilateral lower extremities  No tenderness to palpation over extremity, no gross deformity, no open wounds Fires hip flexors, quadriceps, hamstrings, tibialis anterior, gastrocnemius and soleus Sensation intact to light touch in sural, saphenous, tibial, deep peroneal, and superficial peroneal nerve distributions (decreased distal to the mid tibia) Foot warm and well perfused  -Left lower extremity  No tenderness to palpation over extremity, except over the hip. Leg short when compared to contralateral side. No open wounds.   Pain with log roll Fires tibialis anterior, gastrocnemius and soleus, extensor hallucis longus Does not fire gip flexors, quadriceps, or hamstrings due to pain Partial amputation of the toes Sensation intact to light touch in sural, saphenous, tibial, deep peroneal, and superficial peroneal nerve distributions (decreased distal to the mid tibia)  Foot warm and well perfused  Imaging: XR of the left femur from 03/12/2023 was independently reviewed and interpreted, showing displaced intertrochanteric femur fracture. Varus alignment and short. No other fractures seen.    [name] Patient MRN: [id number] Date: 03/12/23

## 2023-03-12 NOTE — ED Provider Notes (Signed)
[place] EMERGENCY DEPARTMENT AT [facility] Provider Note   CSN: [id number] Arrival date & time: 03/12/23  1428     History  Chief Complaint  Patient presents with  . Fall    Robin [name] is a 72 y.o. female.  Patient is a 72 year old female with a history of CHF, end-stage renal disease on dialysis, bipolar disease, diabetes who is presenting today as a level 2 trauma.  Patient reports that she was reaching to get some peanut butter and lost her balance falling backwards landing on her left hip and hitting the back of her head on the hardwood floor.  She denied any loss of consciousness but reports severe pain in her left hip.  Her family member was there and picked her up and put her in bed but she has not been able to walk since she fell.  She reports otherwise she had been feeling well she was not weak or dizzy causing her fall.  She denies any headache or neck pain at this time.  She denies any shortness of breath.  Last full dialysis was yesterday.  She is currently getting dialysis through a tunneling catheter in her right chest due to malfunction of her graft in her left upper arm.  Reports she takes Eliquis and Plavix.  The history is provided by the patient and the EMS personnel.  Fall       Home Medications Prior to Admission medications   Medication Sig Start Date End Date Taking? Authorizing Provider  acetaminophen (TYLENOL) 325 MG tablet Take 325 mg by mouth 2 (two) times daily.   Yes [provider]  albuterol (VENTOLIN HFA) 108 (90 Base) MCG/ACT inhaler Inhale 1-2 puffs into the lungs every 6 (six) hours as needed for wheezing or shortness of breath.   Yes [provider]  amLODipine (NORVASC) 5 MG tablet Take 1 tablet (5 mg total) by mouth daily. 01/19/23  Yes Lockie Mola, MD  apixaban (ELIQUIS) 5 MG TABS tablet Take 2.5 mg by mouth 2 (two) times daily with a meal.   Yes [provider]  ARIPiprazole (ABILIFY) 30 MG tablet  Take 10 mg by mouth at bedtime. 10/30/22  Yes [provider]  clopidogrel (PLAVIX) 75 MG tablet Take 1 tablet (75 mg total) by mouth daily. 08/06/22  Yes Rana Snare, DO  diclofenac Sodium (VOLTAREN) 1 % GEL Apply 4 g topically 4 (four) times daily as needed (for back pain). 08/05/22  Yes Rana Snare, DO  Doxercalciferol (HECTOROL IV) Inject 1 Dose into the vein See admin instructions. [facility], Every Treatment 01/21/23 01/20/24 Yes [provider]  isosorbide mononitrate (IMDUR) 30 MG 24 hr tablet Take 1 tablet (30 mg total) by mouth daily with breakfast. 08/06/22  Yes Rana Snare, DO  lidocaine (LIDODERM) 5 % Place 1 patch onto the skin every 12 (twelve) hours as needed (pain). 09/02/22  Yes [provider]  lisinopril (ZESTRIL) 5 MG tablet Take 2.5 mg by mouth daily. 09/02/22  Yes [provider]  Melatonin 10 MG TABS Take 10 mg by mouth at bedtime.   Yes [provider]  Methoxy PEG-Epoetin Beta (MIRCERA IJ) Inject 1 Dose as directed every 30 (thirty) days. 02/02/23 02/01/24 Yes [provider]  metoprolol succinate (TOPROL-XL) 25 MG 24 hr tablet Take 1 tablet (25 mg total) by mouth daily. Patient taking differently: Take 12.5 mg by mouth daily. 08/06/22  Yes Rana Snare, DO  multivitamin (RENA-VIT) TABS tablet Take 1 tablet by  mouth at bedtime.  12/11/17  Yes [provider]  nitroGLYCERIN (NITROSTAT) 0.4 MG SL tablet Place 0.4 mg under the tongue every 5 (five) minutes as needed for chest pain.   Yes [provider]  pantoprazole (PROTONIX) 40 MG tablet Take 40 mg by mouth at bedtime.   Yes [provider]  Tiotropium Bromide Monohydrate (SPIRIVA RESPIMAT) 1.25 MCG/ACT AERS Inhale 2 puffs into the lungs every morning.   Yes [provider]      Allergies    Patient has no known allergies.    Review of Systems   Review of Systems  Physical Exam Updated Vital Signs BP (!) 142/78 (BP Location:  Left Arm)   Pulse 83   Temp 98.3 F (36.8 C) (Oral)   Resp 20   SpO2 100%  Physical Exam Vitals and nursing note reviewed.  Constitutional:      General: She is not in acute distress.    Appearance: She is well-developed.  HENT:     Head: Normocephalic and atraumatic.  Eyes:     Conjunctiva/sclera: Conjunctivae normal.     Pupils: Pupils are equal, round, and reactive to light.  Cardiovascular:     Rate and Rhythm: Normal rate and regular rhythm.     Heart sounds: No murmur heard. Pulmonary:     Effort: Pulmonary effort is normal. No respiratory distress.     Breath sounds: Normal breath sounds. No wheezing or rales.     Comments: Tunneling catheter present in the left right upper chest Abdominal:     General: There is no distension.     Palpations: Abdomen is soft.     Tenderness: There is no abdominal tenderness. There is no guarding or rebound.  Musculoskeletal:        General: Tenderness and deformity present.     Cervical back: Normal range of motion and neck supple. No tenderness.     Right hip: Normal.     Left hip: Deformity and tenderness present. Decreased range of motion.     Comments: Dialysis graft present in the left arm without any palpable thrill.  Prior amputation of the left great toe but no wounds on the foot.  1+ DP pulse in the left foot   Skin:    General: Skin is warm and dry.     Findings: No erythema or rash.  Neurological:     Mental Status: She is alert and oriented to person, place, and time.  Psychiatric:        Behavior: Behavior normal.     ED Results / Procedures / Treatments   Labs (all labs ordered are listed, but only abnormal results are displayed) Labs Reviewed  PROTIME-INR - Abnormal; Notable for the following components:      Result Value   Prothrombin Time 15.3 (*)    All other components within normal limits  APTT  CBC WITH DIFFERENTIAL/PLATELET  BASIC METABOLIC PANEL  CBC WITH DIFFERENTIAL/PLATELET     EKG None  Radiology No results found.  Procedures Procedures    Medications Ordered in ED Medications  HYDROmorphone (DILAUDID) injection 0.5 mg (has no administration in time range)    ED Course/ Medical Decision Making/ A&P Clinical Course as of 03/12/23 1657  Thu Mar 12, 2023  1632 Received sign out pending XR hip and chest, CT head. Fell on to left hip. XR appear to have left hip fx pending XR. Hx ESRD and last yesterday.  [WS]    Clinical Course User  Index [WS] Lonell Grandchild, MD                                 Medical Decision Making Amount and/or Complexity of Data Reviewed Labs: ordered. Radiology: ordered.  Risk Prescription drug management.   Pt with multiple medical problems and comorbidities and presenting today with a complaint that caries a high risk for morbidity and mortality.  Here today as a level 2 trauma after a fall.  Based on patient's exam concern for left-sided hip fracture.  Neurovascularly intact at this time but patient also hit her head and is on Plavix and Eliquis.  Mental status is normal at this time.  No obvious signs of trauma to the head.  Seems like she lost her balance but denies any symptoms concerning for strokelike dizziness or weakness.  She has been compliant with her dialysis had last full course yesterday.  No evidence of fluid overload at this time vital signs are reassuring.  Patient given further pain control.  I have independently visualized and interpreted pt's images today.  Pelvic film today shows concern for left intertrochanteric femur fracture.  Chest x-ray without acute findings.  Head CT to rule out bleed is pending.  Labs for medical clearance and EKG also pending           Final Clinical Impression(s) / ED Diagnoses Final diagnoses:  None    Rx / DC Orders ED Discharge Orders     None         Gwyneth Sprout, MD 03/12/23 1657

## 2023-03-12 NOTE — H&P (Signed)
History and Physical  ELIZABETHROSE JOHANSEN [name]:[id number] DOB: 1950-09-13 DOA: 03/13/2023  Referring physician: Dr. Suezanne Jacquet, EDP  PCP: [facility] Specialists: Nephrology. Patient coming from: Home.  Chief Complaint: Fall  HPI: Robin [name] is a 72 y.o. female with medical history significant for ESRD on HD MWF, coronary artery disease status post PCI with stent placement March 2024, HFrEF 45 to 50% (07/29/2022) type 2 diabetes, hypertension, recently admitted by family medicine teaching service at [facility] for chest pain, who presents with a fall at home.  The patient reports she lost her balance in her kitchen today and fell backwards landing on her left hip and hitting the back of her head on the hardwood floor.  No loss of consciousness.  EMS was activated.  She was brought into the ED for further evaluation.  In the ED, workup revealed left intertrochanteric hip fracture seen on x-ray.  EDP discussed the case with orthopedic surgery.  Tentative planning for operative fixation tomorrow evening.  N.p.o. after 9 AM tomorrow.  Admitted by [facility], hospitalist service.  Addendum: The patient was found to have an ischemic stroke seen on noncontrast MRI brain.  Neurology/stroke team consulted.  ED Course: Temperature 98.  Blood pressure 170/95, pulse 74, respiratory 23, saturation 98% on room air.  Lab studies notable for serum glucose 157, creatinine 5.0.  WBC 8.4, hemoglobin 10.9.  Platelet count 178.  Review of Systems: Review of systems as noted in the HPI. All other systems reviewed and are negative.   Past Medical History:  Diagnosis Date   Acute delirium 11/18/2014   Acute encephalopathy    Acute on chronic respiratory failure with hypoxia (HCC) 09/09/2016   Acute respiratory failure with hypoxia (HCC) 11/29/2015   Anemia in chronic kidney disease 12/09/2014   Anxiety    Arthritis    Benign hypertension    Bipolar affective disorder (HCC) 12/09/2014   CAD (coronary artery  disease), native coronary artery with 2 stents  11/17/2014   Charcot foot due to diabetes mellitus (HCC)    Chronic combined systolic and diastolic CHF (congestive heart failure) (HCC) 11/18/2014   Closed left ankle fracture 11/17/2014   Confusion 01/21/2015   Depression    Diabetes mellitus without complication (HCC)    End stage renal disease on dialysis (HCC)    Fracture dislocation of ankle 11/17/2014   GERD (gastroesophageal reflux disease)    Heart murmur    History of blood transfusion    History of bronchitis    History of pneumonia    Hyperlipidemia 03/26/2015   Hypertension associated with diabetes (HCC) 11/18/2014   Hypertensive heart/renal disease with failure (HCC) 12/09/2014   Hypokalemia 11/17/2014   Multiple falls 01/21/2015   Nausea & vomiting 01/15/2023   Obesity 11/17/2014   Onychomycosis 10/07/2016   Right leg DVT (HCC) 12/05/2015   SIRS (systemic inflammatory response syndrome) (HCC) 08/08/2016   Sleep apnea    Unstable angina (HCC) 12/26/2017   Ventricular tachycardia (HCC)    Past Surgical History:  Procedure Laterality Date   A/V FISTULAGRAM N/A 01/03/2020   Procedure: A/V FISTULAGRAM;  Surgeon: Nada Libman, MD;  Location: [facility];  Service: Cardiovascular;  Laterality: N/A;   A/V FISTULAGRAM Left 03/12/2021   Procedure: A/V FISTULAGRAM;  Surgeon: Nada Libman, MD;  Location: [facility];  Service: Cardiovascular;  Laterality: Left;   ABDOMINAL HYSTERECTOMY     ANGIOPLASTY Left 01/26/2020   Procedure: LEFT ARM GRAPH THROMBECTOMY WITH BALLON  ANGIOPLASTY;  Surgeon: Maeola Harman, MD;  Location: [facility] OR;  Service: Vascular;  Laterality: Left;   ANKLE CLOSED REDUCTION N/A 11/17/2014   Procedure: CLOSED REDUCTION ANKLE;  Surgeon: Frederico Hamman, MD;  Location: [facility] OR;  Service: Orthopedics;  Laterality: N/A;   ANKLE FUSION Left 03/16/2015   Procedure: Left Tibiocalcaneal Fusion;  Surgeon: Nadara Mustard, MD;  Location: [facility] OR;  Service:  Orthopedics;  Laterality: Left;   APPLICATION OF WOUND VAC Right 08/06/2016   Procedure: APPLICATION OF PREVENA WOUND VAC;  Surgeon: Nadara Mustard, MD;  Location: [facility] OR;  Service: Orthopedics;  Laterality: Right;   AV FISTULA PLACEMENT Left may-2016   done at [facility]   CARDIAC CATHETERIZATION     2 stent    CHOLECYSTECTOMY     CORONARY PRESSURE/FFR STUDY N/A 07/31/2022   Procedure: INTRAVASCULAR PRESSURE WIRE/FFR STUDY;  Surgeon: Orbie Pyo, MD;  [facility]: [facility];  Service: Cardiovascular;  Laterality: N/A;   CORONARY STENT PLACEMENT     FISTULOGRAM Left 01/26/2020   Procedure: FISTULOGRAM;  Surgeon: Maeola Harman, MD;  Location: [facility] OR;  Service: Vascular;  Laterality: Left;   FOOT ARTHRODESIS Right 08/06/2016   Procedure: Right Foot Fusion Lisfranc Joint;  Surgeon: Nadara Mustard, MD;  Location: [facility] OR;  Service: Orthopedics;  Laterality: Right;   HARDWARE REMOVAL Left 03/16/2015   Procedure: Removal Hardware Left Ankle;  Surgeon: Nadara Mustard, MD;  Location: [name] OR;  Service: Orthopedics;  Laterality: Left;   IR AV DIALY SHUNT INTRO NEEDLE/INTRACATH INITIAL W/PTA/IMG LEFT  01/05/2018   IR AV DIALY SHUNT INTRO NEEDLE/INTRACATH INITIAL W/PTA/IMG LEFT  10/18/2022   IR FLUORO GUIDE CV LINE RIGHT  10/18/2022   IR US GUIDE VASC ACCESS LEFT  01/05/2018   IR US GUIDE VASC ACCESS RIGHT  10/18/2022   LEFT HEART CATH AND CORONARY ANGIOGRAPHY N/A 12/28/2017   Procedure: LEFT HEART CATH AND CORONARY ANGIOGRAPHY;  Surgeon: Kathleene Hazel, MD;  [facility];  Service: Cardiovascular;  Laterality: N/A;   LEFT HEART CATH AND CORONARY ANGIOGRAPHY N/A 07/31/2022   Procedure: LEFT HEART CATH AND CORONARY ANGIOGRAPHY;  Surgeon: Orbie Pyo, MD;  [facility];  Service: Cardiovascular;  Laterality: N/A;   LOWER EXTREMITY ANGIOGRAPHY N/A 03/21/2021   Procedure: LOWER EXTREMITY ANGIOGRAPHY;  Surgeon: Cephus Shelling, MD;  [facility];  Service: Cardiovascular;  Laterality: N/A;   ORIF ANKLE FRACTURE Left 11/20/2014   Procedure: OPEN REDUCTION INTERNAL FIXATION (ORIF) ANKLE FRACTURE;  Surgeon: Sheral Apley, MD;  Location: [facility] OR;  Service: Orthopedics;  Laterality: Left;   PERIPHERAL VASCULAR BALLOON ANGIOPLASTY  01/03/2020   Procedure: PERIPHERAL VASCULAR BALLOON ANGIOPLASTY;  Surgeon: Nada Libman, MD;  [facility];  Service: Cardiovascular;;   PERIPHERAL VASCULAR BALLOON ANGIOPLASTY Left 03/12/2021   Procedure: PERIPHERAL VASCULAR BALLOON ANGIOPLASTY;  Surgeon: Nada Libman, MD;  [facility];  Service: Cardiovascular;  Laterality: Left;   PERIPHERAL VASCULAR BALLOON ANGIOPLASTY  08/22/2021   Procedure: PERIPHERAL VASCULAR BALLOON ANGIOPLASTY;  Surgeon: Cephus Shelling, MD;  [facility];  Service: Cardiovascular;;  Left AVF PTA   TONSILLECTOMY      Social History:  reports that she has been smoking cigarettes. She has never used smokeless tobacco. She reports that she does not drink alcohol and does not use drugs.   No Known Allergies  Family History  Family history unknown: Yes  Prior to Admission medications   Medication Sig Start Date End Date Taking? Authorizing Provider  acetaminophen (TYLENOL) 325 MG tablet Take 325 mg by mouth 2 (two) times daily.   Yes [provider]  albuterol (VENTOLIN HFA) 108 (90 Base) MCG/ACT inhaler Inhale 1-2 puffs into the lungs every 6 (six) hours as needed for wheezing or shortness of breath.   Yes [provider]  amLODipine (NORVASC) 5 MG tablet Take 1 tablet (5 mg total) by mouth daily. 01/19/23  Yes Lockie Mola, MD  apixaban (ELIQUIS) 5 MG TABS tablet Take 2.5 mg by mouth 2 (two) times daily with a meal.   Yes [provider]  ARIPiprazole (ABILIFY) 30 MG tablet Take 10 mg by mouth at bedtime. 10/30/22  Yes [provider]  clopidogrel (PLAVIX) 75 MG tablet Take 1  tablet (75 mg total) by mouth daily. 08/06/22  Yes Rana Snare, DO  diclofenac Sodium (VOLTAREN) 1 % GEL Apply 4 g topically 4 (four) times daily as needed (for back pain). 08/05/22  Yes Rana Snare, DO  Doxercalciferol (HECTOROL IV) Inject 1 Dose into the vein See admin instructions. [facility], Every Treatment 01/21/23 01/20/24 Yes [provider]  isosorbide mononitrate (IMDUR) 30 MG 24 hr tablet Take 1 tablet (30 mg total) by mouth daily with breakfast. 08/06/22  Yes Rana Snare, DO  lidocaine (LIDODERM) 5 % Place 1 patch onto the skin every 12 (twelve) hours as needed (pain). 09/02/22  Yes [provider]  lisinopril (ZESTRIL) 5 MG tablet Take 2.5 mg by mouth daily. 09/02/22  Yes [provider]  Melatonin 10 MG TABS Take 10 mg by mouth at bedtime.   Yes [provider]  Methoxy PEG-Epoetin Beta (MIRCERA IJ) Inject 1 Dose as directed every 30 (thirty) days. 02/02/23 02/01/24 Yes [provider]  metoprolol succinate (TOPROL-XL) 25 MG 24 hr tablet Take 1 tablet (25 mg total) by mouth daily. Patient taking differently: Take 12.5 mg by mouth daily. 08/06/22  Yes Rana Snare, DO  multivitamin (RENA-VIT) TABS tablet Take 1 tablet by mouth at bedtime.  12/11/17  Yes [provider]  nitroGLYCERIN (NITROSTAT) 0.4 MG SL tablet Place 0.4 mg under the tongue every 5 (five) minutes as needed for chest pain.   Yes [provider]  pantoprazole (PROTONIX) 40 MG tablet Take 40 mg by mouth at bedtime.   Yes [provider]  Tiotropium Bromide Monohydrate (SPIRIVA RESPIMAT) 1.25 MCG/ACT AERS Inhale 2 puffs into the lungs every morning.   Yes [provider]    Physical Exam: BP (!) 165/81 (BP Location: Right Arm)   Pulse 71   Temp 98 F (36.7 C) (Oral)   Resp 17   SpO2 99%   General: 72 y.o. year-old female well developed well nourished in no acute distress.  Alert and minimally interactive. Cardiovascular: Tachycardic  with no rubs or gallops.  No thyromegaly or JVD noted.  No lower extremity edema. 2/4 pulses in all 4 extremities. Respiratory: Clear to auscultation with no wheezes or rales.  Poor inspiratory effort. Abdomen: Soft nontender nondistended with normal bowel sounds x4 quadrants. Muskuloskeletal: No cyanosis, clubbing or edema noted bilaterally Neuro: CN II-XII intact, strength, sensation, reflexes Skin: No ulcerative lesions noted or rashes Psychiatry: Mood is appropriate for condition and setting          Labs on Admission:  Basic Metabolic Panel: Recent Labs  Lab 03/09/2023 1755 03/06/2023 1813  NA 140 137  K 4.0 3.8  CL 100 101  CO2 25  --   GLUCOSE 157* 155*  BUN 15 15  CREATININE 5.00* 5.50*  CALCIUM 9.6  --    Liver Function Tests: No results for input(s): "AST", "ALT", "ALKPHOS", "BILITOT", "PROT", "ALBUMIN" in the last 168 hours. No results for input(s): "LIPASE", "AMYLASE" in the last 168 hours. No results for input(s): "AMMONIA" in the last 168 hours. CBC: Recent Labs  Lab 03/31/2023 1702 03-31-23 1813  WBC 8.4  --   NEUTROABS 6.8  --   HGB 10.0* 10.9*  HCT 31.4* 32.0*  MCV 96.0  --   PLT 178  --    Cardiac Enzymes: No results for input(s): "CKTOTAL", "CKMB", "CKMBINDEX", "TROPONINI" in the last 168 hours.  BNP (last 3 results) Recent Labs    07/29/22 0443 01/15/23 2055  BNP 1,147.2* >4,500.0*    ProBNP (last 3 results) No results for input(s): "PROBNP" in the last 8760 hours.  CBG: No results for input(s): "GLUCAP" in the last 168 hours.  Radiological Exams on Admission: DG Hip Unilat W or Wo Pelvis 2-3 Views Left  Result Date: 31-Mar-2023 CLINICAL DATA:  Fall, left hip pain EXAM: DG HIP (WITH OR WITHOUT PELVIS) 2-3V LEFT COMPARISON:  06/09/2007 FINDINGS: Acute intertrochanteric fracture of the proximal left femur with varus angulation. Hip joint alignment is maintained without dislocation. No additional fractures are seen. Bony pelvis intact. Severe  atherosclerotic vascular calcifications. IMPRESSION: Acute intertrochanteric fracture of the proximal left femur. Electronically Signed   By: Duanne Guess D.O.   On: 03/31/2023 16:56   CT Head Wo Contrast  Result Date: 2023/03/31 CLINICAL DATA:  Head trauma, minor (Age >= 65y) EXAM: CT HEAD WITHOUT CONTRAST TECHNIQUE: Contiguous axial images were obtained from the base of the skull through the vertex without intravenous contrast. RADIATION DOSE REDUCTION: This exam was performed according to the departmental dose-optimization program which includes automated exposure control, adjustment of the mA and/or kV according to patient size and/or use of iterative reconstruction technique. COMPARISON:  Head CT 11/20/2022 FINDINGS: Brain: No hemorrhage. No hydrocephalus. No extra-axial fluid collection. Asymmetric hypodensity in the medial aspect of the right cerebellum and in the brachium pontis (series 3, image 9). No mass effect. No mass lesion. Mineralization of the basal ganglia bilaterally. Unchanged slightly hyperdense appearance of the falx Vascular: No hyperdense vessel or unexpected calcification. Skull: Normal. Negative for fracture or focal lesion. Sinuses/Orbits: No middle ear or mastoid effusion. Paranasal sinuses are clear. Bilateral lens replacement. Orbits are otherwise unremarkable. Other: None. IMPRESSION: 1. Asymmetric hypodensity in the medial aspect of the right cerebellum and in the brachium pontis, which could represent an age-indeterminate infarct. Recommend brain MRI for further evaluation. 2. No acute intracranial hemorrhage. Electronically Signed   By: Lorenza Cambridge M.D.   On: 2023-03-31 16:36   DG Chest 1 View  Result Date: 03-31-2023 CLINICAL DATA:  Fall.  History of CHF. EXAM: CHEST  1 VIEW COMPARISON:  Chest radiograph dated 01/15/2023 FINDINGS: Stable cardiomegaly. Stable right CVC catheter tip terminates in the right atrium. Both lungs are clear. No pneumothorax or pleural  effusion. Left axillary vascular stent. No acute osseous abnormality. IMPRESSION: Cardiomegaly.  No acute findings in the chest. Electronically Signed   By: Hart Robinsons M.D.   On: 03-31-2023 16:34    EKG: I independently viewed the EKG done and my findings are as followed: None available at the time of this visit.  Assessment/Plan Present on Admission:  Closed fracture of left hip (HCC)  Principal Problem:   Closed fracture  of left hip (HCC)  Close left hip fracture status post mechanical fall, POA Plan for orthopedic repair \\when  cleared by neurology N.p.o. after 9 AM As needed analgesics and as needed bowel regimen TOC consulted for DC planning PT OT with orthopedic surgery's guidance Currently no weightbearing in left lower extremity Fall precautions  Acute to early subacute infarct in the right brachium pontis, seen on brain MRI Neurology/stroke team consulted. 2D echo with bubble study CT angio head and neck Permissive hypertension Treat SBP greater than 220 or DBP greater than 120 Creatinine normalize blood pressure Frequent neurochecks Fasting lipid panel, A1c PT/OT/speech's evaluation Fall precautions  Type 2 diabetes with hyperglycemia Obtain hemoglobin A1c Start insulin sliding scale.  Coronary artery disease status post PCI with stenting Home Plavix and Eliquis held in anticipation for orthopedic surgery No reported anginal symptoms.  History of thromboembolism DVT, chronic thromboembolic pulmonary hypertension Home Eliquis held. Resume Eliquis when okay with orthopedic surgery.  ESRD on HD MWF Nephrology consulted to resume hemodialysis while inpatient Volume status and electrolytes addressed with hemodialysis.  Bipolar disorder Resume home regimen.  Hypertension Resume home regimen.  HFrEF 45% Euvolemic on exam Resume home regimen Strict I's and O's and daily   Time: Critical care time: 55 minutes.   DVT prophylaxis: Home Eliquis held  due to anticipated orthopedic surgery.  Restart home Eliquis when okay with orthopedic surgery.  Code Status: DNR  Family Communication: None at bedside  Disposition Plan: Admitted to telemetry surgical unit  Consults called: Orthopedic surgery, neurology/stroke team, nephrology.  Admission status: Inpatient status.   Status is: Inpatient The patient requires at least 2 midnights for further evaluation and treatment of present condition.   Darlin Drop MD [facility] Pager [phone]  If 7PM-7AM, please contact night-coverage [email or web address] Password [name]  03/02/2023, 7:32 PM

## 2023-03-12 NOTE — ED Provider Notes (Signed)
   ED Course / MDM   Clinical Course as of 03/12/23 2101  Thu Mar 12, 2023  1632 Received sign out pending XR hip and chest, CT head. Fell on to left hip. XR appear to have left hip fx pending XR. Hx ESRD and last yesterday.  [WS]  1753 Discussed with Dr. Christell Constant with [facility], he will see patient for consultation for hip fracture surgery. CT head negative but has cerebellar hypodensity. No clear history of ataxia per daughter but did fall back and hit her head. Will order MRI.  [WS]  1924 Discussed with Dr. Margo Aye with hospitalist and she will admit patient.  [WS]  2101 Was contacted by radiology. Patient did have an ischemic stroke. I notified Dr. Margo Aye as the patient has already been admitted. She is aware.  [WS]    Clinical Course User Index [WS] Lonell Grandchild, MD   Medical Decision Making Amount and/or Complexity of Data Reviewed Labs: ordered. Radiology: ordered.  Risk Decision regarding hospitalization.          Lonell Grandchild, MD 03/12/23 2101

## 2023-03-12 NOTE — ED Notes (Signed)
ED TO INPATIENT HANDOFF REPORT  ED Nurse Name and Phone #: Jalasia Eskridge, RN [phone]  S Name/Age/Gender Robin [name] 72 y.o. female Room/Bed: 008C/008C  Code Status   Code Status: Prior  Home/SNF/Other Rehab Patient oriented to: self, place, and situation Is this baseline? Yes   Triage Complete: Triage complete  Chief Complaint fall  Triage Note Patient BIB EMS today from home with a fall. Patient is currently on plavix and eliquis. Patient states she was in kitchen reaching for peanut butter and lost her balance and fell and hit back of head. Patietn did not have LOC. Left hip def noted by EMS.   22 lt AC   of fent   Allergies No Known Allergies  Level of Care/Admitting Diagnosis ED Disposition     ED Disposition  Admit   Condition  --   Comment  The patient appears reasonably stabilized for admission considering the current resources, flow, and capabilities available in the ED at this time, and I doubt any other [facility] requiring further screening and/or treatment in the ED prior to admission is  present.          B Medical/Surgery History Past Medical History:  Diagnosis Date   Acute delirium 11/18/2014   Acute encephalopathy    Acute on chronic respiratory failure with hypoxia (HCC) 09/09/2016   Acute respiratory failure with hypoxia (HCC) 11/29/2015   Anemia in chronic kidney disease 12/09/2014   Anxiety    Arthritis    Benign hypertension    Bipolar affective disorder (HCC) 12/09/2014   CAD (coronary artery disease), native coronary artery with 2 stents  11/17/2014   Charcot foot due to diabetes mellitus (HCC)    Chronic combined systolic and diastolic CHF (congestive heart failure) (HCC) 11/18/2014   Closed left ankle fracture 11/17/2014   Confusion 01/21/2015   Depression    Diabetes mellitus without complication (HCC)    End stage renal disease on dialysis (HCC)    Fracture dislocation of ankle 11/17/2014   GERD (gastroesophageal reflux disease)    Heart  murmur    History of blood transfusion    History of bronchitis    History of pneumonia    Hyperlipidemia 03/26/2015   Hypertension associated with diabetes (HCC) 11/18/2014   Hypertensive heart/renal disease with failure (HCC) 12/09/2014   Hypokalemia 11/17/2014   Multiple falls 01/21/2015   Nausea & vomiting 01/15/2023   Obesity 11/17/2014   Onychomycosis 10/07/2016   Right leg DVT (HCC) 12/05/2015   SIRS (systemic inflammatory response syndrome) (HCC) 08/08/2016   Sleep apnea    Unstable angina (HCC) 12/26/2017   Ventricular tachycardia (HCC)    Past Surgical History:  Procedure Laterality Date   A/V FISTULAGRAM N/A 01/03/2020   Procedure: A/V FISTULAGRAM;  Surgeon: Nada Libman, MD;  Location: [facility];  Service: Cardiovascular;  Laterality: N/A;   A/V FISTULAGRAM Left 03/12/2021   Procedure: A/V FISTULAGRAM;  Surgeon: Nada Libman, MD;  Location: [facility];  Service: Cardiovascular;  Laterality: Left;   ABDOMINAL HYSTERECTOMY     ANGIOPLASTY Left 01/26/2020   Procedure: LEFT ARM GRAPH THROMBECTOMY WITH BALLON ANGIOPLASTY;  Surgeon: Maeola Harman, MD;  Location: [name] OR;  Service: Vascular;  Laterality: Left;   ANKLE CLOSED REDUCTION N/A 11/17/2014   Procedure: CLOSED REDUCTION ANKLE;  Surgeon: Frederico Hamman, MD;  Location: [facility] OR;  Service: Orthopedics;  Laterality: N/A;   ANKLE FUSION Left 03/16/2015   Procedure: Left Tibiocalcaneal Fusion;  Surgeon: Aldean Baker  V, MD;  Location: [facility] OR;  Service: Orthopedics;  Laterality: Left;   APPLICATION OF WOUND VAC Right 08/06/2016   Procedure: APPLICATION OF PREVENA WOUND VAC;  Surgeon: Nadara Mustard, MD;  Location: [facility] OR;  Service: Orthopedics;  Laterality: Right;   AV FISTULA PLACEMENT Left may-2016   done at [facility]   CARDIAC CATHETERIZATION     2 stent    CHOLECYSTECTOMY     CORONARY PRESSURE/FFR STUDY N/A 07/31/2022   Procedure: INTRAVASCULAR PRESSURE WIRE/FFR STUDY;  Surgeon: Orbie Pyo, MD;   Location: [facility];  Service: Cardiovascular;  Laterality: N/A;   CORONARY STENT PLACEMENT     FISTULOGRAM Left 01/26/2020   Procedure: FISTULOGRAM;  Surgeon: Maeola Harman, MD;  Location: [facility] OR;  Service: Vascular;  Laterality: Left;   FOOT ARTHRODESIS Right 08/06/2016   Procedure: Right Foot Fusion Lisfranc Joint;  Surgeon: Nadara Mustard, MD;  Location: [facility] OR;  Service: Orthopedics;  Laterality: Right;   HARDWARE REMOVAL Left 03/16/2015   Procedure: Removal Hardware Left Ankle;  Surgeon: Nadara Mustard, MD;  Location[name] OR;  Service: Orthopedics;  Laterality: Left;   IR AV DIALY SHUNT INTRO NEEDLE/INTRACATH INITIAL W/PTA/IMG LEFT  01/05/2018   IR AV DIALY SHUNT INTRO NEEDLE/INTRACATH INITIAL W/PTA/IMG LEFT  10/18/2022   IR FLUORO GUIDE CV LINE RIGHT  10/18/2022   IR US GUIDE VASC ACCESS LEFT  01/05/2018   IR US GUIDE VASC ACCESS RIGHT  10/18/2022   LEFT HEART CATH AND CORONARY ANGIOGRAPHY N/A 12/28/2017   Procedure: LEFT HEART CATH AND CORONARY ANGIOGRAPHY;  Surgeon: Kathleene Hazel, MD;  [facility];  Service: Cardiovascular;  Laterality: N/A;   LEFT HEART CATH AND CORONARY ANGIOGRAPHY N/A 07/31/2022   Procedure: LEFT HEART CATH AND CORONARY ANGIOGRAPHY;  Surgeon: Orbie Pyo, MD;  [facility];  Service: Cardiovascular;  Laterality: N/A;   LOWER EXTREMITY ANGIOGRAPHY N/A 03/21/2021   Procedure: LOWER EXTREMITY ANGIOGRAPHY;  Surgeon: Cephus Shelling, MD;  [facility];  Service: Cardiovascular;  Laterality: N/A;   ORIF ANKLE FRACTURE Left 11/20/2014   Procedure: OPEN REDUCTION INTERNAL FIXATION (ORIF) ANKLE FRACTURE;  Surgeon: Sheral Apley, MD;  Location: [facility];  Service: Orthopedics;  Laterality: Left;   PERIPHERAL VASCULAR BALLOON ANGIOPLASTY  01/03/2020   Procedure: PERIPHERAL VASCULAR BALLOON ANGIOPLASTY;  Surgeon: Nada Libman, MD;  [facility];  Service: Cardiovascular;;   PERIPHERAL  VASCULAR BALLOON ANGIOPLASTY Left 03/12/2021   Procedure: PERIPHERAL VASCULAR BALLOON ANGIOPLASTY;  Surgeon: Nada Libman, MD;  [facility];  Service: Cardiovascular;  Laterality: Left;   PERIPHERAL VASCULAR BALLOON ANGIOPLASTY  08/22/2021   Procedure: PERIPHERAL VASCULAR BALLOON ANGIOPLASTY;  Surgeon: Cephus Shelling, MD;  [facility];  Service: Cardiovascular;;  Left AVF PTA   TONSILLECTOMY       A IV Location/Drains/Wounds Patient Lines/Drains/Airways Status     Active Line/Drains/Airways     Name Placement date Placement time Site Days   Peripheral IV 03/12/23 22 G Left Antecubital 03/12/23  --  Antecubital  less than 1   Fistula / Graft Left Upper arm Arteriovenous fistula 09/30/14  --  Upper arm  3085   Fistula / Graft Left Upper arm Arteriovenous fistula --  --  Upper arm  --   Fistula / Graft Left Upper arm --  --  Upper arm  --   Hemodialysis Catheter Right Internal jugular 10/18/22  1017  Internal jugular  145   Wound / Incision (Open or Dehisced) 10/17/22 (MARSI) Medical Adhesive-Related Skin Injury;Irritant Dermatitis (Moisture Associated Skin Damage) Labia Anterior 10/17/22  1515  Labia  146            Intake/Output Last 24 hours No intake or output data in the 24 hours ending 03/12/23 1853  Labs/Imaging Results for orders placed or performed during the hospital encounter of 03/12/23 (from the past 48 hour(s))  Protime-INR     Status: Abnormal   Collection Time: 03/12/23  2:58 PM  Result Value Ref Range   Prothrombin Time 15.3 (H) 11.4 - 15.2 seconds   INR 1.2 0.8 - 1.2    Comment: (NOTE) INR goal varies based on device and disease states. Performed at [facility], [address], [place], [place] [address]   APTT     Status: None   Collection Time: 03/12/23  2:58 PM  Result Value Ref Range   aPTT 35 24 - 36 seconds    Comment: Performed at [facility], [address], [place], [place] [address]  CBC  with Differential/Platelet     Status: Abnormal   Collection Time: 03/12/23  5:02 PM  Result Value Ref Range   WBC 8.4 4.0 - 10.5 K/uL   RBC 3.27 (L) 3.87 - 5.11 MIL/uL   Hemoglobin 10.0 (L) 12.0 - 15.0 g/dL   HCT 08.6 (L) 57.8 - 46.9 %   MCV 96.0 80.0 - 100.0 fL   MCH 30.6 26.0 - 34.0 pg   MCHC 31.8 30.0 - 36.0 g/dL   RDW 62.9 (H) 52.8 - 41.3 %   Platelets 178 150 - 400 K/uL   nRBC 0.0 0.0 - 0.2 %   Neutrophils Relative % 81 %   Neutro Abs 6.8 1.7 - 7.7 K/uL   Lymphocytes Relative 9 %   Lymphs Abs 0.8 0.7 - 4.0 K/uL   Monocytes Relative 8 %   Monocytes Absolute 0.7 0.1 - 1.0 K/uL   Eosinophils Relative 1 %   Eosinophils Absolute 0.0 0.0 - 0.5 K/uL   Basophils Relative 0 %   Basophils Absolute 0.0 0.0 - 0.1 K/uL   Immature Granulocytes 1 %   Abs Immature Granulocytes 0.04 0.00 - 0.07 K/uL    Comment: Performed at [facility], [address], [place], [place] [address]  Basic metabolic panel     Status: Abnormal   Collection Time: 03/12/23  5:55 PM  Result Value Ref Range   Sodium 140 135 - 145 mmol/L   Potassium 4.0 3.5 - 5.1 mmol/L   Chloride 100 98 - 111 mmol/L   CO2 25 22 - 32 mmol/L   Glucose, Bld 157 (H) 70 - 99 mg/dL    Comment: Glucose reference range applies only to samples taken after fasting for at least 8 hours.   BUN 15 8 - 23 mg/dL   Creatinine, Ser 0.27 (H) 0.44 - 1.00 mg/dL   Calcium 9.6 8.9 - 25.3 mg/dL   GFR, Estimated 9 (L) >60 mL/min    Comment: (NOTE) Calculated using the CKD-EPI Creatinine Equation (2021)    Anion gap 15 5 - 15    Comment: Performed at [facility], [address] [place], [place] [address]  I-stat chem 8, ED     Status: Abnormal   Collection Time: 03/12/23  6:13 PM  Result Value Ref Range   Sodium 137 135 - 145 mmol/L   Potassium  3.8 3.5 - 5.1 mmol/L   Chloride 101 98 - 111 mmol/L   BUN 15 8 - 23 mg/dL   Creatinine, Ser 4.09 (H) 0.44 - 1.00 mg/dL   Glucose, Bld 811 (H) 70 - 99 mg/dL    Comment: Glucose reference  range applies only to samples taken after fasting for at least 8 hours.   Calcium, Ion 1.07 (L) 1.15 - 1.40 mmol/L   TCO2 25 22 - 32 mmol/L   Hemoglobin 10.9 (L) 12.0 - 15.0 g/dL   HCT 91.4 (L) 78.2 - 95.6 %   DG Hip Unilat W or Wo Pelvis 2-3 Views Left  Result Date: 03/12/2023 CLINICAL DATA:  Fall, left hip pain EXAM: DG HIP (WITH OR WITHOUT PELVIS) 2-3V LEFT COMPARISON:  06/09/2007 FINDINGS: Acute intertrochanteric fracture of the proximal left femur with varus angulation. Hip joint alignment is maintained without dislocation. No additional fractures are seen. Bony pelvis intact. Severe atherosclerotic vascular calcifications. IMPRESSION: Acute intertrochanteric fracture of the proximal left femur. Electronically Signed   By: Duanne Guess D.O.   On: 03/12/2023 16:56   CT Head Wo Contrast  Result Date: 03/12/2023 CLINICAL DATA:  Head trauma, minor (Age >= 65y) EXAM: CT HEAD WITHOUT CONTRAST TECHNIQUE: Contiguous axial images were obtained from the base of the skull through the vertex without intravenous contrast. RADIATION DOSE REDUCTION: This exam was performed according to the departmental dose-optimization program which includes automated exposure control, adjustment of the mA and/or kV according to patient size and/or use of iterative reconstruction technique. COMPARISON:  Head CT 11/20/2022 FINDINGS: Brain: No hemorrhage. No hydrocephalus. No extra-axial fluid collection. Asymmetric hypodensity in the medial aspect of the right cerebellum and in the brachium pontis (series 3, image 9). No mass effect. No mass lesion. Mineralization of the basal ganglia bilaterally. Unchanged slightly hyperdense appearance of the falx Vascular: No hyperdense vessel or unexpected calcification. Skull: Normal. Negative for fracture or focal lesion. Sinuses/Orbits: No middle ear or mastoid effusion. Paranasal sinuses are clear. Bilateral lens replacement. Orbits are otherwise unremarkable. Other: None.  IMPRESSION: 1. Asymmetric hypodensity in the medial aspect of the right cerebellum and in the brachium pontis, which could represent an age-indeterminate infarct. Recommend brain MRI for further evaluation. 2. No acute intracranial hemorrhage. Electronically Signed   By: Lorenza Cambridge M.D.   On: 03/12/2023 16:36   DG Chest 1 View  Result Date: 03/12/2023 CLINICAL DATA:  Fall.  History of CHF. EXAM: CHEST  1 VIEW COMPARISON:  Chest radiograph dated 01/15/2023 FINDINGS: Stable cardiomegaly. Stable right CVC catheter tip terminates in the right atrium. Both lungs are clear. No pneumothorax or pleural effusion. Left axillary vascular stent. No acute osseous abnormality. IMPRESSION: Cardiomegaly.  No acute findings in the chest. Electronically Signed   By: Hart Robinsons M.D.   On: 03/12/2023 16:34    Pending Labs Unresulted Labs (From admission, onward)     Start     Ordered   03/12/23 1439  CBC with Differential/Platelet  Once,   STAT        03/12/23 1438            Vitals/Pain Today's Vitals   03/12/23 1616 03/12/23 1701 03/12/23 1730 03/12/23 1835  BP:    (!) 165/81  Pulse:   62 71  Resp:   15 17  Temp:    98 F (36.7 C)  TempSrc:    Oral  SpO2:   100% 99%  PainSc: 10-Worst pain ever 8       Isolation  Precautions No active isolations  Medications Medications  HYDROmorphone (DILAUDID) injection 0.5 mg (0.5 mg Intravenous Given 03/12/23 1622)    Mobility walks with device     Focused Assessments    R Recommendations: See Admitting Provider Note  Report given to:   Additional Notes: Patient is A&Ox3, disoriented to time. She usually walks at home and has caregiver at all times but now will likely need rehab prior to returning home. Family has voiced concerns about her going to surgery. Legal guardian is daughter and in her chart. Son is currently at bedside.

## 2023-03-12 NOTE — ED Notes (Signed)
Xray at bedside, MRI ready for transport.

## 2023-03-13 ENCOUNTER — Inpatient Hospital Stay (HOSPITAL_COMMUNITY): Payer: No Typology Code available for payment source

## 2023-03-13 ENCOUNTER — Other Ambulatory Visit: Payer: Self-pay

## 2023-03-13 ENCOUNTER — Encounter (HOSPITAL_COMMUNITY): Payer: No Typology Code available for payment source

## 2023-03-13 ENCOUNTER — Encounter (HOSPITAL_COMMUNITY): Payer: Self-pay | Admitting: Internal Medicine

## 2023-03-13 DIAGNOSIS — I6350 Cerebral infarction due to unspecified occlusion or stenosis of unspecified cerebral artery: Secondary | ICD-10-CM | POA: Diagnosis not present

## 2023-03-13 DIAGNOSIS — S72142A Displaced intertrochanteric fracture of left femur, initial encounter for closed fracture: Secondary | ICD-10-CM | POA: Diagnosis not present

## 2023-03-13 DIAGNOSIS — I63341 Cerebral infarction due to thrombosis of right cerebellar artery: Secondary | ICD-10-CM

## 2023-03-13 LAB — CBC
HCT: 33.2 % — ABNORMAL LOW (ref 36.0–46.0)
HCT: 32.2 % — ABNORMAL LOW (ref 36.0–46.0)
Hemoglobin: 10.8 g/dL — ABNORMAL LOW (ref 12.0–15.0)
Hemoglobin: 10.4 g/dL — ABNORMAL LOW (ref 12.0–15.0)
MCH: 30.7 pg (ref 26.0–34.0)
MCH: 31.3 pg (ref 26.0–34.0)
MCHC: 32.5 g/dL (ref 30.0–36.0)
MCHC: 32.3 g/dL (ref 30.0–36.0)
MCV: 94.3 fL (ref 80.0–100.0)
MCV: 97 fL (ref 80.0–100.0)
Platelets: 189 10*3/uL (ref 150–400)
Platelets: 180 10*3/uL (ref 150–400)
RBC: 3.52 MIL/uL — ABNORMAL LOW (ref 3.87–5.11)
RBC: 3.32 MIL/uL — ABNORMAL LOW (ref 3.87–5.11)
RDW: 18.1 % — ABNORMAL HIGH (ref 11.5–15.5)
RDW: 18.3 % — ABNORMAL HIGH (ref 11.5–15.5)
WBC: 9.1 10*3/uL (ref 4.0–10.5)
WBC: 9.4 10*3/uL (ref 4.0–10.5)
nRBC: 0 % (ref 0.0–0.2)
nRBC: 0 % (ref 0.0–0.2)

## 2023-03-13 LAB — RENAL FUNCTION PANEL
Albumin: 3.1 g/dL — ABNORMAL LOW (ref 3.5–5.0)
Albumin: 3.1 g/dL — ABNORMAL LOW (ref 3.5–5.0)
Anion gap: 19 — ABNORMAL HIGH (ref 5–15)
Anion gap: 18 — ABNORMAL HIGH (ref 5–15)
BUN: 22 mg/dL (ref 8–23)
BUN: 23 mg/dL (ref 8–23)
CO2: 24 mmol/L (ref 22–32)
CO2: 25 mmol/L (ref 22–32)
Calcium: 9.9 mg/dL (ref 8.9–10.3)
Calcium: 9.5 mg/dL (ref 8.9–10.3)
Chloride: 97 mmol/L — ABNORMAL LOW (ref 98–111)
Chloride: 97 mmol/L — ABNORMAL LOW (ref 98–111)
Creatinine, Ser: 6.37 mg/dL — ABNORMAL HIGH (ref 0.44–1.00)
Creatinine, Ser: 6.66 mg/dL — ABNORMAL HIGH (ref 0.44–1.00)
GFR, Estimated: 7 mL/min — ABNORMAL LOW (ref >60)
GFR, Estimated: 6 mL/min — ABNORMAL LOW (ref >60)
Glucose, Bld: 96 mg/dL (ref 70–99)
Glucose, Bld: 112 mg/dL — ABNORMAL HIGH (ref 70–99)
Phosphorus: 6.8 mg/dL — ABNORMAL HIGH (ref 2.5–4.6)
Phosphorus: 6.7 mg/dL — ABNORMAL HIGH (ref 2.5–4.6)
Potassium: 4.4 mmol/L (ref 3.5–5.1)
Potassium: 4.4 mmol/L (ref 3.5–5.1)
Sodium: 140 mmol/L (ref 135–145)
Sodium: 140 mmol/L (ref 135–145)

## 2023-03-13 LAB — GLUCOSE, CAPILLARY
Glucose-Capillary: 135 mg/dL — ABNORMAL HIGH (ref 70–99)
Glucose-Capillary: 150 mg/dL — ABNORMAL HIGH (ref 70–99)
Glucose-Capillary: 163 mg/dL — ABNORMAL HIGH (ref 70–99)
Glucose-Capillary: 174 mg/dL — ABNORMAL HIGH (ref 70–99)
Glucose-Capillary: 86 mg/dL (ref 70–99)
Glucose-Capillary: 92 mg/dL (ref 70–99)
Glucose-Capillary: 93 mg/dL (ref 70–99)

## 2023-03-13 LAB — POCT I-STAT, CHEM 8
BUN: 33 mg/dL — ABNORMAL HIGH (ref 8–23)
Calcium, Ion: 1.16 mmol/L (ref 1.15–1.40)
Chloride: 98 mmol/L (ref 98–111)
Creatinine, Ser: 7 mg/dL — ABNORMAL HIGH (ref 0.44–1.00)
Glucose, Bld: 96 mg/dL (ref 70–99)
HCT: 37 % (ref 36.0–46.0)
Hemoglobin: 12.6 g/dL (ref 12.0–15.0)
Potassium: 6.1 mmol/L — ABNORMAL HIGH (ref 3.5–5.1)
Sodium: 137 mmol/L (ref 135–145)
TCO2: 29 mmol/L (ref 22–32)

## 2023-03-13 LAB — HEMOGLOBIN A1C
Hgb A1c MFr Bld: 7.7 % — ABNORMAL HIGH (ref 4.8–5.6)
Mean Plasma Glucose: 174.29 mg/dL

## 2023-03-13 LAB — LIPID PANEL
Cholesterol: 189 mg/dL (ref 0–200)
HDL: 69 mg/dL (ref >40)
LDL Cholesterol: 105 mg/dL — ABNORMAL HIGH (ref 0–99)
Total CHOL/HDL Ratio: 2.7 {ratio}
Triglycerides: 74 mg/dL (ref <150)
VLDL: 15 mg/dL (ref 0–40)

## 2023-03-13 LAB — HEPATITIS B SURFACE ANTIGEN: Hepatitis B Surface Ag: NONREACTIVE

## 2023-03-13 LAB — PHOSPHORUS: Phosphorus: 6.7 mg/dL — ABNORMAL HIGH (ref 2.5–4.6)

## 2023-03-13 MED ORDER — CHLORHEXIDINE GLUCONATE CLOTH 2 % EX PADS: 6.0000 | MEDICATED_PAD | Freq: Every day | CUTANEOUS | Status: DC

## 2023-03-13 MED ORDER — ALTEPLASE 2 MG IJ SOLR: 2.0000 mg | Freq: Once | INTRAMUSCULAR | Status: DC | PRN

## 2023-03-13 MED ORDER — ONDANSETRON HCL 4 MG/2ML IJ SOLN
4.0000 mg | Freq: Once | INTRAMUSCULAR | Status: AC
  Administered 2023-03-13: 4 mg via INTRAVENOUS
  Filled 2023-03-13: qty 2

## 2023-03-13 MED ORDER — DOXERCALCIFEROL 4 MCG/2ML IV SOLN
5.0000 ug | INTRAVENOUS | Status: DC
  Administered 2023-03-13 – 2023-03-19 (×3): 5 ug via INTRAVENOUS
  Filled 2023-03-13 (×7): qty 4

## 2023-03-13 MED ORDER — PENTAFLUOROPROP-TETRAFLUOROETH EX AERO: 1.0000 | INHALATION_SPRAY | CUTANEOUS | Status: DC | PRN

## 2023-03-13 MED ORDER — ANTICOAGULANT SODIUM CITRATE 4% (200MG/5ML) IV SOLN: 5.0000 mL | Status: DC | PRN

## 2023-03-13 MED ORDER — LIDOCAINE HCL (PF) 1 % IJ SOLN: 5.0000 mL | INTRAMUSCULAR | Status: DC | PRN

## 2023-03-13 MED ORDER — METOPROLOL TARTRATE 12.5 MG HALF TABLET
12.5000 mg | ORAL_TABLET | Freq: Once | ORAL | Status: DC
  Filled 2023-03-13: qty 1

## 2023-03-13 MED ORDER — ATORVASTATIN CALCIUM 40 MG PO TABS
40.0000 mg | ORAL_TABLET | Freq: Every day | ORAL | Status: DC
  Administered 2023-03-13 – 2023-03-15 (×3): 40 mg via ORAL
  Filled 2023-03-13 (×3): qty 1

## 2023-03-13 MED ORDER — CHLORHEXIDINE GLUCONATE 0.12 % MT SOLN
OROMUCOSAL | Status: AC
  Filled 2023-03-13: qty 15

## 2023-03-13 MED ORDER — TRANEXAMIC ACID-NACL 1000-0.7 MG/100ML-% IV SOLN
1000.0000 mg | INTRAVENOUS | Status: DC
  Filled 2023-03-13: qty 100

## 2023-03-13 MED ORDER — HEPARIN SODIUM (PORCINE) 1000 UNIT/ML IJ SOLN
3800.0000 [IU] | Freq: Once | INTRAMUSCULAR | Status: AC
  Administered 2023-03-13: 3800 [IU]
  Filled 2023-03-13: qty 4

## 2023-03-13 MED ORDER — CEFAZOLIN SODIUM-DEXTROSE 2-4 GM/100ML-% IV SOLN
2.0000 g | INTRAVENOUS | Status: DC
  Filled 2023-03-13: qty 100

## 2023-03-13 MED ORDER — HEPARIN SODIUM (PORCINE) 1000 UNIT/ML DIALYSIS: 1000.0000 [IU] | INTRAMUSCULAR | Status: DC | PRN

## 2023-03-13 MED ORDER — LIDOCAINE-PRILOCAINE 2.5-2.5 % EX CREA: 1.0000 | TOPICAL_CREAM | CUTANEOUS | Status: DC | PRN

## 2023-03-13 MED ORDER — CHLORHEXIDINE GLUCONATE CLOTH 2 % EX PADS
6.0000 | MEDICATED_PAD | Freq: Every day | CUTANEOUS | Status: DC
  Administered 2023-03-14 – 2023-03-21 (×8): 6 via TOPICAL

## 2023-03-13 NOTE — Consult Note (Signed)
NEUROLOGY CONSULT NOTE   Date of service: March 13, 2023 [name] MRN:  [id number] DOB:  Aug 25, 1950 Chief Complaint: "R brachium pontine stroke" Requesting Provider: Darlin Drop, DO  History of Present Illness  Robin [name] is a 72 y.o. female CAD, DM2, HTN, HLD, GERD, OSA,ESRD on HD,  Afibb on Eliquis and also on plavix who presents with fall at home. She was brought in to the ED and found to have L hip fracture.  CT head with no ICH but concerning for R cerebellum stroke. MRI brain demonstrated a R brachium pontine stroke.  Daughter who is POA is at bedside. Reprots patient is mostly in her bed. She has a inhouse care taker that comes in most of the times and daughter helps out rest of the times. Patient apparently got out of bed on her own and she fell. Noted L leg deformity by EMS and brought in.  LKW: unclear, Modified rankin score: 4-Needs assistance to walk and tend to bodily needs IV Thrombolysis: not offered 2/2 no clear LKW EVT: not offered, no clear LKW   ROS  Unable to ascertain due to dementia and poor recollection.  Past History   Past Medical History:  Diagnosis Date   Acute delirium 11/18/2014   Acute encephalopathy    Acute on chronic respiratory failure with hypoxia (HCC) 09/09/2016   Acute respiratory failure with hypoxia (HCC) 11/29/2015   Anemia in chronic kidney disease 12/09/2014   Anxiety    Arthritis    Benign hypertension    Bipolar affective disorder (HCC) 12/09/2014   CAD (coronary artery disease), native coronary artery with 2 stents  11/17/2014   Charcot foot due to diabetes mellitus (HCC)    Chronic combined systolic and diastolic CHF (congestive heart failure) (HCC) 11/18/2014   Closed left ankle fracture 11/17/2014   Confusion 01/21/2015   Depression    Diabetes mellitus without complication (HCC)    End stage renal disease on dialysis (HCC)    Fracture dislocation of ankle 11/17/2014   GERD (gastroesophageal reflux disease)     Heart murmur    History of blood transfusion    History of bronchitis    History of pneumonia    Hyperlipidemia 03/26/2015   Hypertension associated with diabetes (HCC) 11/18/2014   Hypertensive heart/renal disease with failure (HCC) 12/09/2014   Hypokalemia 11/17/2014   Multiple falls 01/21/2015   Nausea & vomiting 01/15/2023   Obesity 11/17/2014   Onychomycosis 10/07/2016   Right leg DVT (HCC) 12/05/2015   SIRS (systemic inflammatory response syndrome) (HCC) 08/08/2016   Sleep apnea    Unstable angina (HCC) 12/26/2017   Ventricular tachycardia (HCC)     Past Surgical History:  Procedure Laterality Date   A/V FISTULAGRAM N/A 01/03/2020   Procedure: A/V FISTULAGRAM;  Surgeon: Nada Libman, MD;  Location: [facility];  Service: Cardiovascular;  Laterality: N/A;   A/V FISTULAGRAM Left 03/12/2021   Procedure: A/V FISTULAGRAM;  Surgeon: Nada Libman, MD;  Location: [facility];  Service: Cardiovascular;  Laterality: Left;   ABDOMINAL HYSTERECTOMY     ANGIOPLASTY Left 01/26/2020   Procedure: LEFT ARM GRAPH THROMBECTOMY WITH BALLON ANGIOPLASTY;  Surgeon: Maeola Harman, MD;  Location: [facility] OR;  Service: Vascular;  Laterality: Left;   ANKLE CLOSED REDUCTION N/A 11/17/2014   Procedure: CLOSED REDUCTION ANKLE;  Surgeon: Frederico Hamman, MD;  Location: [name] OR;  Service: Orthopedics;  Laterality: N/A;   ANKLE FUSION Left 03/16/2015   Procedure:  Left Tibiocalcaneal Fusion;  Surgeon: Nadara Mustard, MD;  Location: [facility] OR;  Service: Orthopedics;  Laterality: Left;   APPLICATION OF WOUND VAC Right 08/06/2016   Procedure: APPLICATION OF PREVENA WOUND VAC;  Surgeon: Nadara Mustard, MD;  Location: [facility] OR;  Service: Orthopedics;  Laterality: Right;   AV FISTULA PLACEMENT Left may-2016   done at [name]   CARDIAC CATHETERIZATION     2 stent    CHOLECYSTECTOMY     CORONARY PRESSURE/FFR STUDY N/A 07/31/2022   Procedure: INTRAVASCULAR PRESSURE WIRE/FFR STUDY;  Surgeon: Orbie Pyo,  MD;  Location: [facility];  Service: Cardiovascular;  Laterality: N/A;   CORONARY STENT PLACEMENT     FISTULOGRAM Left 01/26/2020   Procedure: FISTULOGRAM;  Surgeon: Maeola Harman, MD;  Location: [facility] OR;  Service: Vascular;  Laterality: Left;   FOOT ARTHRODESIS Right 08/06/2016   Procedure: Right Foot Fusion Lisfranc Joint;  Surgeon: Nadara Mustard, MD;  Location: [facility] OR;  Service: Orthopedics;  Laterality: Right;   HARDWARE REMOVAL Left 03/16/2015   Procedure: Removal Hardware Left Ankle;  Surgeon: Nadara Mustard, MD;  Location: [facility] OR;  Service: Orthopedics;  Laterality: Left;   IR AV DIALY SHUNT INTRO NEEDLE/INTRACATH INITIAL W/PTA/IMG LEFT  01/05/2018   IR AV DIALY SHUNT INTRO NEEDLE/INTRACATH INITIAL W/PTA/IMG LEFT  10/18/2022   IR FLUORO GUIDE CV LINE RIGHT  10/18/2022   IR US GUIDE VASC ACCESS LEFT  01/05/2018   IR US GUIDE VASC ACCESS RIGHT  10/18/2022   LEFT HEART CATH AND CORONARY ANGIOGRAPHY N/A 12/28/2017   Procedure: LEFT HEART CATH AND CORONARY ANGIOGRAPHY;  Surgeon: Kathleene Hazel, MD;  [facility]: [facility];  Service: Cardiovascular;  Laterality: N/A;   LEFT HEART CATH AND CORONARY ANGIOGRAPHY N/A 07/31/2022   Procedure: LEFT HEART CATH AND CORONARY ANGIOGRAPHY;  Surgeon: Orbie Pyo, MD;  [facility]: [facility];  Service: Cardiovascular;  Laterality: N/A;   LOWER EXTREMITY ANGIOGRAPHY N/A 03/21/2021   Procedure: LOWER EXTREMITY ANGIOGRAPHY;  Surgeon: Cephus Shelling, MD;  [facility]: [facility];  Service: Cardiovascular;  Laterality: N/A;   ORIF ANKLE FRACTURE Left 11/20/2014   Procedure: OPEN REDUCTION INTERNAL FIXATION (ORIF) ANKLE FRACTURE;  Surgeon: Sheral Apley, MD;  Location: [facility] OR;  Service: Orthopedics;  Laterality: Left;   PERIPHERAL VASCULAR BALLOON ANGIOPLASTY  01/03/2020   Procedure: PERIPHERAL VASCULAR BALLOON ANGIOPLASTY;  Surgeon: Nada Libman, MD;  [facility]: [facility];  Service: Cardiovascular;;    PERIPHERAL VASCULAR BALLOON ANGIOPLASTY Left 03/12/2021   Procedure: PERIPHERAL VASCULAR BALLOON ANGIOPLASTY;  Surgeon: Nada Libman, MD;  [facility];  Service: Cardiovascular;  Laterality: Left;   PERIPHERAL VASCULAR BALLOON ANGIOPLASTY  08/22/2021   Procedure: PERIPHERAL VASCULAR BALLOON ANGIOPLASTY;  Surgeon: Cephus Shelling, MD;  Location: [facility];  Service: Cardiovascular;;  Left AVF PTA   TONSILLECTOMY      Family History: Family History  Family history unknown: Yes    Social History  reports that she has been smoking cigarettes. She has never used smokeless tobacco. She reports that she does not drink alcohol and does not use drugs.  No Known Allergies  Medications   Current Facility-Administered Medications:    acetaminophen (TYLENOL) tablet 650 mg, 650 mg, Oral, Q6H PRN, Hall, Carole N, DO   albuterol (PROVENTIL) (2.5 MG/3ML) 0.083% nebulizer solution 3 mL, 3 mL, Inhalation, Q6H PRN, Hall, Carole N, DO   HYDROmorphone (DILAUDID) injection 0.5 mg,  0.5 mg, Intravenous, Q4H PRN, Darlin Drop, DO, 0.5 mg at 03/19/2023 2119   insulin aspart (novoLOG) injection 0-5 Units, 0-5 Units, Subcutaneous, QHS, Hall, Carole N, DO   insulin aspart (novoLOG) injection 0-6 Units, 0-6 Units, Subcutaneous, TID WC, Hall, Carole N, DO   labetalol (NORMODYNE) injection 5 mg, 5 mg, Intravenous, Q2H PRN, Hall, Carole N, DO   melatonin tablet 5 mg, 5 mg, Oral, QHS PRN, Margo Aye, Carole N, DO   multivitamin (RENA-VIT) tablet 1 tablet, 1 tablet, Oral, QHS, Hall, Carole N, DO, 1 tablet at 03/08/2023 2259   oxyCODONE (Oxy IR/ROXICODONE) immediate release tablet 5 mg, 5 mg, Oral, Q6H PRN, Hall, Carole N, DO   pantoprazole (PROTONIX) EC tablet 40 mg, 40 mg, Oral, QHS, Hall, Carole N, DO, 40 mg at 03/13/2023 2259   polyethylene glycol (MIRALAX / GLYCOLAX) packet 17 g, 17 g, Oral, Daily PRN, Margo Aye, Carole N, DO   prochlorperazine (COMPAZINE) injection 5 mg, 5 mg, Intravenous, Q6H  PRN, Hall, Carole N, DO   umeclidinium bromide (INCRUSE ELLIPTA) 62.5 MCG/ACT 1 puff, 1 puff, Inhalation, Daily, Darlin Drop, DO  Vitals   Vitals:   03/22/2023 1730 03/13/2023 1835 02/27/2023 2051 03/13/23 0006  BP:  (!) 165/81 (!) 180/84 (!) 140/94  Pulse: 62 71 (!) 57 73  Resp: 15 17 18 18   Temp:  98 F (36.7 C) 98.3 F (36.8 C)   TempSrc:  Oral Oral   SpO2: 100% 99% 99% 95%    There is no height or weight on file to calculate BMI.  Physical Exam   Constitutional: Appears well-developed and well-nourished.  Psych: Affect appropriate to situation.  Eyes: No scleral injection.  HENT: No OP obstruction.  Head: Normocephalic.  Cardiovascular: Normal rate and regular rhythm.  Respiratory: Effort normal, non-labored breathing.  GI: Soft.  No distension. There is no tenderness.  Skin: WDI.   Neurologic Examination  Mental status/Cognition: Alert, oriented to self, place, but not to month and year, poor attention. Speech/language: Fluent, comprehension intact, object naming intact, repetition intact. Cranial nerves:   CN II Sluggish pupilarry response BL. Legally blind per my discusion with daughter   CN III,IV,VI EOM intact, no gaze preference or deviation, no nystagmus    CN V normal sensation in V1, V2, and V3 segments bilaterally    CN VII no asymmetry, no nasolabial fold flattening    CN VIII normal hearing to speech    CN IX & X normal palatal elevation, no uvular deviation    CN XI 5/5 head turn and 5/5 shoulder shrug bilaterally    CN XII midline tongue protrusion    Motor:  Muscle bulk: poor, tone normal Mvmt Root Nerve  Muscle Right Left Comments  SA C5/6 Ax Deltoid     EF C5/6 Mc Biceps 5 5   EE C6/7/8 Rad Triceps 5 5   WF C6/7 Med FCR     WE C7/8 PIN ECU     F Ab C8/T1 U ADM/FDI 5 5   HF L1/2/3 Fem Illopsoas 5 - Deferred L leg hip flexion due to known L hip fx.  KE L2/3/4 Fem Quad     DF L4/5 D Peron Tib Ant 5 5   PF S1/2 Tibial Grc/Sol 5 5    Sensation:   Light touch Intact throughout   Pin prick    Temperature    Vibration   Proprioception    Coordination/Complex Motor:  - Finger to Nose intact BL - Heel to shin  unable to do - Rapid alternating movement slowed BL - Gait:  deferred for patient safety.   Labs   CBC:  Recent Labs  Lab 03/05/2023 1702 03/03/2023 1813  WBC 8.4  --   NEUTROABS 6.8  --   HGB 10.0* 10.9*  HCT 31.4* 32.0*  MCV 96.0  --   PLT 178  --     Basic Metabolic Panel:  Lab Results  Component Value Date   NA 137 03/09/2023   K 3.8 03/22/2023   CO2 25 03/11/2023   GLUCOSE 155 (H) 03/10/2023   BUN 15 03/15/2023   CREATININE 5.50 (H) 03/11/2023   CALCIUM 9.6 02/24/2023   GFRNONAA 9 (L) 03/18/2023   GFRAA 5 (L) 01/31/2020   Lipid Panel:  Lab Results  Component Value Date   LDLCALC 40 08/01/2022   HgbA1c:  Lab Results  Component Value Date   HGBA1C 7.5 (H) 01/15/2023   Urine Drug Screen:     Component Value Date/Time   LABOPIA NONE DETECTED 10/17/2022 0638   COCAINSCRNUR NONE DETECTED 10/17/2022 0638   LABBENZ NONE DETECTED 10/17/2022 0638   AMPHETMU NONE DETECTED 10/17/2022 0638   THCU NONE DETECTED 10/17/2022 0638   LABBARB NONE DETECTED 10/17/2022 0638    Alcohol Level     Component Value Date/Time   ETH <10 10/17/2022 1242   INR  Lab Results  Component Value Date   INR 1.2 03/26/2023   APTT  Lab Results  Component Value Date   APTT 35 03/04/2023   AED levels: No results found for: "PHENYTOIN", "ZONISAMIDE", "LAMOTRIGINE", "LEVETIRACETA"   CT Head without contrast(Personally reviewed): Concern for acute R cerebellum stroke.  CT angio Head and Neck with contrast: pending  MRI Brain(Personally reviewed): R brachium pontis stroke  Impression   Robin [name] is a 72 y.o. female CAD, DM2, HTN, HLD, GERD, OSA,ESRD on HD,  Afibb on Eliquis and also on plavix who presents with fall at home. She was brought in to the ED and found to have L hip fracture. CT Head with concern for  R cerebellum stroke. MRI brain demonstrated a R brachium pontis stroke.  She is on eliquis and plavix and compliant and did not miss any doses. Etiology of stroke is unclear. Appears embolic.  Recommendations  - Frequent Neuro checks per stroke unit protocol - Recommend Vascular imaging with MRA Angio Head without contrast and US Carotid doppler - Recommend obtaining TTE - Recommend obtaining Lipid panel with LDL - Please start statin if LDL > 70 - Recommend HbA1c to evaluate for diabetes and how well it is controlled. No need for antiplatelet from a stroke standpoint if she is going to be on anticoagulation. - anticoagulation with eliquis. Ideally want to wait 2 days prior to resuming eliquis. - SBP goal - permissive hypertension first 24 h < 220/110. Held home meds.  - Recommend Telemetry monitoring for arrythmia - Recommend bedside swallow screen prior to PO intake. - Stroke education booklet - Recommend PT/OT/SLP consult - From a stroke standpoint, if the need for surgery is emergent can do it immediately. for urgent procedure, would recommend waiting 2 weeks. for routine surgeries wait 90 days. ______________________________________________________________________    Welton Flakes

## 2023-03-13 NOTE — Plan of Care (Signed)
Orthopedic Surgery Note  Patient was off the floor. Still planning for operative fixation tonight after discussing patient with medicine and neurology. Keep NPO for surgery. NWB LLE  London Sheer, MD Orthopedic Surgeon

## 2023-03-13 NOTE — Progress Notes (Signed)
SLP Cancellation Note  Patient Details Name: Robin [name] MRN: [id number] DOB: Oct 18, 1950   Cancelled treatment:       Reason Eval/Treat Not Completed: Medical issues which prohibited therapy. Order received for swallow eval but note that pt is to be NPO for surgery. SLP to hold eval until she can resume POs.     Mahala Menghini., M.A. CCC-SLP Acute Rehabilitation Services Office [phone]  Secure chat preferred  03/13/2023, 9:32 AM

## 2023-03-13 NOTE — Progress Notes (Signed)
Patient arrived back from Short Stay as unable to go to OR due to elevated potassium.  Patient to transport to Dialysis unit now with Patient Transporter.  Report/handoff given to [employer] in Dialysis unit.

## 2023-03-13 NOTE — Plan of Care (Signed)
CHL Tonsillectomy/Adenoidectomy, Postoperative PEDS care plan entered in error.

## 2023-03-13 NOTE — Progress Notes (Signed)
Patient's potassium on ISTAT is 6.1. Pt unable to go to surgery per Dr. Arby Barrette until potassium is lower. Dr. Carola Frost made aware and agrees pt should dialyze prior to surgery. Dialysis notified, however they are unable to start her treatment at this time, they are hoping to have a spot available in 70-80min. OR aware of surgery delay. Floor nurse notified pt is returning. Pt's daughter [name] made aware.

## 2023-03-13 NOTE — Progress Notes (Signed)
Progress Note   Patient: Robin [name] ZOX:[id number] DOB: December 04, 1950 DOA: Mar 21, 2023     1 DOS: the patient was seen and examined on 03/13/2023    Subjective:  Patient seen and examined at bedside this morning Denies nausea vomiting abdominal pain or chest pain Has some pain at the left hip area improving with pain medication    Brief hospital course: Robin [name] is a 72 y.o. female with medical history significant for ESRD on HD MWF, coronary artery disease status post PCI with stent placement March 2024, HFrEF 45 to 50% (07/29/2022) type 2 diabetes, hypertension, recently admitted by family medicine teaching service at [name] for chest pain, who presents with a fall at home.  The patient reports she lost her balance in her kitchen and fell backwards landing on her left hip and hitting the back of her head on the hardwood floor.  No loss of consciousness.  EMS was activated.  Upon arrival to the emergency room MRI of the brain also showed acute to early subacute ischemic stroke and neurology is following.  X-ray of the left femur showed left intertrochanteric hip fracture. Neurologist and orthopedics on board     Assessment and Plan: Close left hip fracture status post mechanical fall, POA Plan for orthopedic repair \\when  cleared by neurology N.p.o. after 9 AM As needed analgesics and as needed bowel regimen TOC consulted for DC planning PT OT with orthopedic surgery's guidance Currently no weightbearing in left lower extremity Fall precautions Plan of care discussed with orthopedics   Acute to early subacute infarct in the right brachium pontis, seen on brain MRI Neurology/stroke team on board we appreciate input Follow-up 2D echo with bubble study MRA of the head did not show any significant vessel occlusion Permissive hypertension for the first 24 hours Treat SBP greater than 220 or DBP greater than 120 Monitor blood pressure Continue neurochecks LDH 105, A1c 7.7 Initiate  high intensity statin therapy PT/OT/speech's evaluation Continue fall precautions Have discussed the case with the neurologist   Type 2 diabetes with hyperglycemia 1C7.7 Continue current insulin therapy   Coronary artery disease status post PCI with stenting Home Plavix and Eliquis held in anticipation for orthopedic surgery No reported anginal symptoms.   History of thromboembolism DVT, chronic thromboembolic pulmonary hypertension Home Eliquis held. Resume Eliquis when okay with orthopedic surgery.   ESRD on HD MWF Nephrologist on board for dialysis needs Monitor electrolytes closely   Bipolar disorder Continue home regimen.   Hypertension Holding antihypertensives at this time on account of permissive hypertension   HFrEF 45% Euvolemic on exam Holding antihypertensives at this time on account of permissive hypertension Strict I's and O's and daily       DVT prophylaxis: Home Eliquis held due to anticipated orthopedic surgery.  Restart home Eliquis when okay with orthopedic surgery.   Code Status: DNR   Family Communication: None at bedside   Disposition Plan: Admitted to telemetry surgical unit   Consults called: Orthopedic surgery, neurology/stroke team, nephrology.   Admission status: Inpatient status.     Status is: Inpatient    Physical Exam:  General: Elderly female in no acute distress Cardiovascular: Tachycardic with no rubs or gallops.  No thyromegaly or JVD noted.  No lower extremity edema. 2/4 pulses in all 4 extremities. Respiratory: Clear to auscultation with no wheezes or rales.  Poor inspiratory effort. Abdomen: Soft nontender nondistended with normal bowel sounds x4 quadrants. Muskuloskeletal: No cyanosis, clubbing or edema noted bilaterally Neuro: CN II-XII intact,  strength, sensation, reflexes Skin: No ulcerative lesions noted or rashes Psychiatry: Mood is appropriate for condition and setting  Data Reviewed: I have reviewed patient's  MRI of the brain with no significant stenosis, I have also reviewed patient's MRI of the brain showing acute to early subacute ischemic stroke, I have reviewed patient labs including CBC BMP as listed above I have reviewed orthopedic documentation as well as neurologist documentation    Time spent: 58 minutes  Vitals:   03/13/23 0006 03/13/23 0448 03/13/23 0828 03/13/23 1028  BP: (!) 140/94 (!) 165/97 (!) 160/72 (!) 189/89  Pulse: 73 91 75 78  Resp: 18 19 17 18   Temp:  98.9 F (37.2 C) 98.7 F (37.1 C) 98.6 F (37 C)  TempSrc:  Oral Oral   SpO2: 95% 95% 96% 93%  Height:    5\' 4"  (1.626 m)      Latest Ref Rng & Units 03/13/2023    1:00 PM 03/13/2023   12:38 PM 03/13/2023   10:51 AM  CBC  WBC 4.0 - 10.5 K/uL 9.4  9.1    Hemoglobin 12.0 - 15.0 g/dL 16.1  09.6  04.5   Hematocrit 36.0 - 46.0 % 32.2  33.2  37.0   Platelets 150 - 400 K/uL 180  189         Latest Ref Rng & Units 03/13/2023    1:00 PM 03/13/2023   12:38 PM 03/13/2023   10:51 AM  CMP  Glucose 70 - 99 mg/dL 409  96  96   BUN 8 - 23 mg/dL 23  22  33   Creatinine 0.44 - 1.00 mg/dL 8.11  9.14  7.82   Sodium 135 - 145 mmol/L 140  140  137   Potassium 3.5 - 5.1 mmol/L 4.4  4.4  6.1   Chloride 98 - 111 mmol/L 97  97  98   CO2 22 - 32 mmol/L 25  24    Calcium 8.9 - 10.3 mg/dL 9.5  9.9       Author: Loyce Dys, MD 03/13/2023 1:21 PM  For on call review [email or web address].

## 2023-03-13 NOTE — Plan of Care (Signed)
No OR today. Patient and family updated on plan of care.   Problem: Education: Goal: Knowledge of General Education information will improve Description: Including pain rating scale, medication(s)/side effects and non-pharmacologic comfort measures Outcome: Progressing   Problem: Health Behavior/Discharge Planning: Goal: Ability to manage health-related needs will improve Outcome: Progressing   Problem: Clinical Measurements: Goal: Ability to maintain clinical measurements within normal limits will improve Outcome: Progressing

## 2023-03-13 NOTE — Progress Notes (Signed)
Attempted Echocardiogram, Patient is going to Dialysis and then to surgery.

## 2023-03-13 NOTE — Progress Notes (Signed)
Arrived back from Hemodialysis.  OR cancelled for today, plan for OR tomorrow with Dr. Christell Constant.  Diet resumed and NPO after MN order placed.     03/13/23 1811  Vitals  Temp 98.7 F (37.1 C)  Temp Source Oral  BP (!) 149/123  MAP (mmHg) 133  BP Location Right Arm  BP Method Automatic  Patient Position (if appropriate) Lying  Pulse Rate 77  Resp 18  MEWS COLOR  MEWS Score Color Green  Oxygen Therapy  SpO2 100 %  O2 Device Room Air  MEWS Score  MEWS Temp 0  MEWS Systolic 0  MEWS Pulse 0  MEWS RR 0  MEWS LOC 0  MEWS Score 0

## 2023-03-13 NOTE — Plan of Care (Signed)
Orthopedic Plan of Care Note  Due to reduced OR availability tonight, surgery will be moved to tomorrow morning. Front desk has a 730 start available so I will plan to address her fracture in the morning. She is okay for a diet. NPO at midnight.   London Sheer, MD Orthopedic Surgeon

## 2023-03-13 NOTE — Plan of Care (Signed)
Problem: Education: Goal: Understanding of post-operative needs will improve 03/13/2023 0628 by Charma Igo, LPN Outcome: Progressing 03/13/2023 0620 by Charma Igo, LPN Outcome: Progressing Goal: Individualized Educational Video(s) 03/13/2023 0628 by Charma Igo, LPN Outcome: Progressing 03/13/2023 0620 by Charma Igo, LPN Outcome: Progressing   Problem: Clinical Measurements: Goal: Postoperative complications will be avoided or minimized 03/13/2023 0628 by Charma Igo, LPN Outcome: Progressing 03/13/2023 0620 by Charma Igo, LPN Outcome: Progressing   Problem: Respiratory: Goal: Will regain and/or maintain adequate ventilation 03/13/2023 0628 by Charma Igo, LPN Outcome: Progressing 03/13/2023 0620 by Charma Igo, LPN Outcome: Progressing   Problem: Education: Goal: Knowledge of General Education information will improve Description: Including pain rating scale, medication(s)/side effects and non-pharmacologic comfort measures 03/13/2023 1923 by Charma Igo, LPN Outcome: Progressing 03/13/2023 0628 by Charma Igo, LPN Outcome: Progressing 03/13/2023 0620 by Charma Igo, LPN Outcome: Progressing   Problem: Health Behavior/Discharge Planning: Goal: Ability to manage health-related needs will improve 03/13/2023 1923 by Charma Igo, LPN Outcome: Progressing 03/13/2023 0628 by Charma Igo, LPN Outcome: Progressing 03/13/2023 0620 by Charma Igo, LPN Outcome: Progressing   Problem: Clinical Measurements: Goal: Ability to maintain clinical measurements within normal limits will improve 03/13/2023 1923 by Charma Igo, LPN Outcome: Progressing 03/13/2023 0628 by Charma Igo, LPN Outcome: Progressing 03/13/2023 0620 by Charma Igo, LPN Outcome: Progressing Goal: Will remain free from infection 03/13/2023 1923 by Charma Igo, LPN Outcome: Progressing 03/13/2023 0628 by Charma Igo, LPN Outcome: Progressing 03/13/2023 0620 by Charma Igo, LPN Outcome: Progressing Goal: Diagnostic test results will improve 03/13/2023 1923 by Charma Igo, LPN Outcome: Progressing 03/13/2023 0628 by Charma Igo, LPN Outcome: Progressing 03/13/2023 0620 by Charma Igo, LPN Outcome: Progressing Goal: Respiratory complications will improve 03/13/2023 1923 by Charma Igo, LPN Outcome: Progressing 03/13/2023 0628 by Charma Igo, LPN Outcome: Progressing 03/13/2023 0620 by Charma Igo, LPN Outcome: Progressing Goal: Cardiovascular complication will be avoided 03/13/2023 1923 by Charma Igo, LPN Outcome: Progressing 03/13/2023 0628 by Charma Igo, LPN Outcome: Progressing 03/13/2023 0620 by Charma Igo, LPN Outcome: Progressing   Problem: Activity: Goal: Risk for activity intolerance will decrease 03/13/2023 1923 by Charma Igo, LPN Outcome: Progressing 03/13/2023 0628 by Charma Igo, LPN Outcome: Progressing 03/13/2023 0620 by Charma Igo, LPN Outcome: Progressing   Problem: Nutrition: Goal: Adequate nutrition will be maintained 03/13/2023 1923 by Charma Igo, LPN Outcome: Progressing 03/13/2023 0628 by Charma Igo, LPN Outcome: Progressing 03/13/2023 0620 by Charma Igo, LPN Outcome: Progressing   Problem: Coping: Goal: Level of anxiety will decrease 03/13/2023 1923 by Charma Igo, LPN Outcome: Progressing 03/13/2023 0628 by Charma Igo, LPN Outcome: Progressing 03/13/2023 0620 by Charma Igo, LPN Outcome: Progressing   Problem: Elimination: Goal: Will not experience complications related to bowel motility 03/13/2023 1923 by Charma Igo, LPN Outcome: Progressing 03/13/2023 0628 by Charma Igo, LPN Outcome: Progressing 03/13/2023 0620 by Charma Igo, LPN Outcome: Progressing Goal: Will not experience complications related to urinary  retention 03/13/2023 1923 by Charma Igo, LPN Outcome: Progressing 03/13/2023 0628 by Charma Igo, LPN Outcome: Progressing 03/13/2023 0620 by Charma Igo, LPN Outcome: Progressing   Problem: Pain Managment: Goal: General experience of comfort will improve 03/13/2023 1923 by Charma Igo, LPN Outcome: Progressing 03/13/2023 0628 by Charma Igo, LPN Outcome: Progressing 03/13/2023 0620 by Charma Igo,  LPN Outcome: Progressing   Problem: Safety: Goal: Ability to remain free from injury will improve 03/13/2023 1923 by Charma Igo, LPN Outcome: Progressing 03/13/2023 0628 by Charma Igo, LPN Outcome: Progressing 03/13/2023 0620 by Charma Igo, LPN Outcome: Progressing   Problem: Skin Integrity: Goal: Risk for impaired skin integrity will decrease 03/13/2023 1923 by Charma Igo, LPN Outcome: Progressing 03/13/2023 0628 by Charma Igo, LPN Outcome: Progressing 03/13/2023 0620 by Charma Igo, LPN Outcome: Progressing   Problem: Education: Goal: Ability to describe self-care measures that may prevent or decrease complications (Diabetes Survival Skills Education) will improve 03/13/2023 1923 by Charma Igo, LPN Outcome: Progressing 03/13/2023 0628 by Charma Igo, LPN Outcome: Progressing 03/13/2023 0620 by Charma Igo, LPN Outcome: Progressing Goal: Individualized Educational Video(s) 03/13/2023 1923 by Charma Igo, LPN Outcome: Progressing 03/13/2023 0628 by Charma Igo, LPN Outcome: Progressing 03/13/2023 0620 by Charma Igo, LPN Outcome: Progressing   Problem: Coping: Goal: Ability to adjust to condition or change in health will improve 03/13/2023 1923 by Charma Igo, LPN Outcome: Progressing 03/13/2023 0628 by Charma Igo, LPN Outcome: Progressing 03/13/2023 0620 by Charma Igo, LPN Outcome: Progressing   Problem: Fluid Volume: Goal: Ability to maintain a  balanced intake and output will improve 03/13/2023 1923 by Charma Igo, LPN Outcome: Progressing 03/13/2023 0628 by Charma Igo, LPN Outcome: Progressing 03/13/2023 0620 by Charma Igo, LPN Outcome: Progressing   Problem: Health Behavior/Discharge Planning: Goal: Ability to identify and utilize available resources and services will improve 03/13/2023 1923 by Charma Igo, LPN Outcome: Progressing 03/13/2023 0628 by Charma Igo, LPN Outcome: Progressing 03/13/2023 0620 by Charma Igo, LPN Outcome: Progressing Goal: Ability to manage health-related needs will improve 03/13/2023 1923 by Charma Igo, LPN Outcome: Progressing 03/13/2023 0628 by Charma Igo, LPN Outcome: Progressing 03/13/2023 0620 by Charma Igo, LPN Outcome: Progressing   Problem: Metabolic: Goal: Ability to maintain appropriate glucose levels will improve 03/13/2023 1923 by Charma Igo, LPN Outcome: Progressing 03/13/2023 0628 by Charma Igo, LPN Outcome: Progressing 03/13/2023 0620 by Charma Igo, LPN Outcome: Progressing   Problem: Nutritional: Goal: Maintenance of adequate nutrition will improve 03/13/2023 1923 by Charma Igo, LPN Outcome: Progressing 03/13/2023 0628 by Charma Igo, LPN Outcome: Progressing 03/13/2023 0620 by Charma Igo, LPN Outcome: Progressing Goal: Progress toward achieving an optimal weight will improve 03/13/2023 1923 by Charma Igo, LPN Outcome: Progressing 03/13/2023 0628 by Charma Igo, LPN Outcome: Progressing 03/13/2023 0620 by Charma Igo, LPN Outcome: Progressing   Problem: Skin Integrity: Goal: Risk for impaired skin integrity will decrease 03/13/2023 1923 by Charma Igo, LPN Outcome: Progressing 03/13/2023 0628 by Charma Igo, LPN Outcome: Progressing 03/13/2023 0620 by Charma Igo, LPN Outcome: Progressing   Problem: Tissue Perfusion: Goal: Adequacy of  tissue perfusion will improve 03/13/2023 1923 by Charma Igo, LPN Outcome: Progressing 03/13/2023 0628 by Charma Igo, LPN Outcome: Progressing 03/13/2023 0620 by Charma Igo, LPN Outcome: Progressing

## 2023-03-13 NOTE — Consult Note (Signed)
[facility] Renal Consultation Note    Indication for Consultation:  Management of ESRD/hemodialysis; anemia, hypertension/volume and secondary hyperparathyroidism  GNF:AOZHYQ, [name]  HPI: Robin [name] is a 72 y.o. female with ESRD on HD MWF at [facility].  Past medical history significant for DM, HTN, CAD s/p PCI, HFrEF 45-50%, depression, schizoaffective d/o, Hx DVT and Hx GIB.   Patient seen and examined in room with no family at bedside.  Reports she fell in her kitchen after bumping into the cabinet.  She fell on her L hip and hit her head.  Denies LOC or dizziness.  EMS called and brought to ED for evaluation.  Today, reports pain in L hip and back.  Requesting to be repositioned.  Admits to SOB.  Denies CP, abdominal pain, HA and n/v/d.  Patient reports she has not been going to dialysis lately d/t issue with ride.  Review of outpatient chart does not appear to have missed HD in the last 3 weeks.   Pertinent findings during work up include xray with moderately displaced intertrochanteric fracture of proximal left femur and MRI brain with acute R brachium pontine stroke; MRA motion degraded but no large vessel occulusion or high grade stenosis noted.  Patient admitted for further evaluation and management.    Past Medical History:  Diagnosis Date   Acute delirium 11/18/2014   Acute encephalopathy    Acute on chronic respiratory failure with hypoxia (HCC) 09/09/2016   Acute respiratory failure with hypoxia (HCC) 11/29/2015   Anemia in chronic kidney disease 12/09/2014   Anxiety    Arthritis    Benign hypertension    Bipolar affective disorder (HCC) 12/09/2014   CAD (coronary artery disease), native coronary artery with 2 stents  11/17/2014   Charcot foot due to diabetes mellitus (HCC)    Chronic combined systolic and diastolic CHF (congestive heart failure) (HCC) 11/18/2014   Closed left ankle fracture 11/17/2014   Confusion 01/21/2015   Depression    Diabetes  mellitus without complication (HCC)    End stage renal disease on dialysis (HCC)    Fracture dislocation of ankle 11/17/2014   GERD (gastroesophageal reflux disease)    Heart murmur    History of blood transfusion    History of bronchitis    History of pneumonia    Hyperlipidemia 03/26/2015   Hypertension associated with diabetes (HCC) 11/18/2014   Hypertensive heart/renal disease with failure (HCC) 12/09/2014   Hypokalemia 11/17/2014   Multiple falls 01/21/2015   Nausea & vomiting 01/15/2023   Obesity 11/17/2014   Onychomycosis 10/07/2016   Right leg DVT (HCC) 12/05/2015   SIRS (systemic inflammatory response syndrome) (HCC) 08/08/2016   Sleep apnea    Unstable angina (HCC) 12/26/2017   Ventricular tachycardia (HCC)    Past Surgical History:  Procedure Laterality Date   A/V FISTULAGRAM N/A 01/03/2020   Procedure: A/V FISTULAGRAM;  Surgeon: Nada Libman, MD;  [facility]: [facility];  Service: Cardiovascular;  Laterality: N/A;   A/V FISTULAGRAM Left 03/12/2021   Procedure: A/V FISTULAGRAM;  Surgeon: Nada Libman, MD;  Location: [facility];  Service: Cardiovascular;  Laterality: Left;   ABDOMINAL HYSTERECTOMY     ANGIOPLASTY Left 01/26/2020   Procedure: LEFT ARM GRAPH THROMBECTOMY WITH BALLON ANGIOPLASTY;  Surgeon: Maeola Harman, MD;  Location: [facility] OR;  Service: Vascular;  Laterality: Left;   ANKLE CLOSED REDUCTION N/A 11/17/2014   Procedure: CLOSED REDUCTION ANKLE;  Surgeon: Frederico Hamman, MD;  Location: [facility] OR;  Service: Orthopedics;  Laterality: N/A;   ANKLE FUSION Left 03/16/2015   Procedure: Left Tibiocalcaneal Fusion;  Surgeon: Nadara Mustard, MD;  Location: [facility] OR;  Service: Orthopedics;  Laterality: Left;   APPLICATION OF WOUND VAC Right 08/06/2016   Procedure: APPLICATION OF PREVENA WOUND VAC;  Surgeon: Nadara Mustard, MD;  Location: [facility] OR;  Service: Orthopedics;  Laterality: Right;   AV FISTULA PLACEMENT Left may-2016   done at [facility]   CARDIAC  CATHETERIZATION     2 stent    CHOLECYSTECTOMY     CORONARY PRESSURE/FFR STUDY N/A 07/31/2022   Procedure: INTRAVASCULAR PRESSURE WIRE/FFR STUDY;  Surgeon: Orbie Pyo, MD;  [facility]: [facility];  Service: Cardiovascular;  Laterality: N/A;   CORONARY STENT PLACEMENT     FISTULOGRAM Left 01/26/2020   Procedure: FISTULOGRAM;  Surgeon: Maeola Harman, MD;  Location: [facility] OR;  Service: Vascular;  Laterality: Left;   FOOT ARTHRODESIS Right 08/06/2016   Procedure: Right Foot Fusion Lisfranc Joint;  Surgeon: Nadara Mustard, MD;  Location: [facility] OR;  Service: Orthopedics;  Laterality: Right;   HARDWARE REMOVAL Left 03/16/2015   Procedure: Removal Hardware Left Ankle;  Surgeon: Nadara Mustard, MD;  Location: [facility] OR;  Service: Orthopedics;  Laterality: Left;   IR AV DIALY SHUNT INTRO NEEDLE/INTRACATH INITIAL W/PTA/IMG LEFT  01/05/2018   IR AV DIALY SHUNT INTRO NEEDLE/INTRACATH INITIAL W/PTA/IMG LEFT  10/18/2022   IR FLUORO GUIDE CV LINE RIGHT  10/18/2022   IR US GUIDE VASC ACCESS LEFT  01/05/2018   IR US GUIDE VASC ACCESS RIGHT  10/18/2022   LEFT HEART CATH AND CORONARY ANGIOGRAPHY N/A 12/28/2017   Procedure: LEFT HEART CATH AND CORONARY ANGIOGRAPHY;  Surgeon: Kathleene Hazel, MD;  [facility];  Service: Cardiovascular;  Laterality: N/A;   LEFT HEART CATH AND CORONARY ANGIOGRAPHY N/A 07/31/2022   Procedure: LEFT HEART CATH AND CORONARY ANGIOGRAPHY;  Surgeon: Orbie Pyo, MD;  [facility];  Service: Cardiovascular;  Laterality: N/A;   LOWER EXTREMITY ANGIOGRAPHY N/A 03/21/2021   Procedure: LOWER EXTREMITY ANGIOGRAPHY;  Surgeon: Cephus Shelling, MD;  [facility];  Service: Cardiovascular;  Laterality: N/A;   ORIF ANKLE FRACTURE Left 11/20/2014   Procedure: OPEN REDUCTION INTERNAL FIXATION (ORIF) ANKLE FRACTURE;  Surgeon: Sheral Apley, MD;  Location: [facility];  Service: Orthopedics;  Laterality: Left;   PERIPHERAL VASCULAR BALLOON  ANGIOPLASTY  01/03/2020   Procedure: PERIPHERAL VASCULAR BALLOON ANGIOPLASTY;  Surgeon: Nada Libman, MD;  [facility];  Service: Cardiovascular;;   PERIPHERAL VASCULAR BALLOON ANGIOPLASTY Left 03/12/2021   Procedure: PERIPHERAL VASCULAR BALLOON ANGIOPLASTY;  Surgeon: Nada Libman, MD;  [facility];  Service: Cardiovascular;  Laterality: Left;   PERIPHERAL VASCULAR BALLOON ANGIOPLASTY  08/22/2021   Procedure: PERIPHERAL VASCULAR BALLOON ANGIOPLASTY;  Surgeon: Cephus Shelling, MD;  [facility];  Service: Cardiovascular;;  Left AVF PTA   TONSILLECTOMY     Family History  Family history unknown: Yes   Social History:  reports that she has been smoking cigarettes. She has never used smokeless tobacco. She reports that she does not drink alcohol and does not use drugs. No Known Allergies Prior to Admission medications   Medication Sig Start Date End Date Taking? Authorizing Provider  acetaminophen (TYLENOL) 325 MG tablet Take 325 mg by mouth 2 (two) times daily.   Yes [provider]  albuterol (VENTOLIN HFA) 108 (90  Base) MCG/ACT inhaler Inhale 1-2 puffs into the lungs every 6 (six) hours as needed for wheezing or shortness of breath.   Yes [provider]  amLODipine (NORVASC) 5 MG tablet Take 1 tablet (5 mg total) by mouth daily. 01/19/23  Yes Lockie Mola, MD  apixaban (ELIQUIS) 5 MG TABS tablet Take 2.5 mg by mouth 2 (two) times daily with a meal.   Yes [provider]  ARIPiprazole (ABILIFY) 30 MG tablet Take 10 mg by mouth at bedtime. 10/30/22  Yes [provider]  clopidogrel (PLAVIX) 75 MG tablet Take 1 tablet (75 mg total) by mouth daily. 08/06/22  Yes Rana Snare, DO  diclofenac Sodium (VOLTAREN) 1 % GEL Apply 4 g topically 4 (four) times daily as needed (for back pain). 08/05/22  Yes Rana Snare, DO  Doxercalciferol (HECTOROL IV) Inject 1 Dose into the vein See admin instructions.  [facility], Every Treatment 01/21/23 01/20/24 Yes [provider]  isosorbide mononitrate (IMDUR) 30 MG 24 hr tablet Take 1 tablet (30 mg total) by mouth daily with breakfast. 08/06/22  Yes Rana Snare, DO  lidocaine (LIDODERM) 5 % Place 1 patch onto the skin every 12 (twelve) hours as needed (pain). 09/02/22  Yes [provider]  lisinopril (ZESTRIL) 5 MG tablet Take 2.5 mg by mouth daily. 09/02/22  Yes [provider]  Melatonin 10 MG TABS Take 10 mg by mouth at bedtime.   Yes [provider]  Methoxy PEG-Epoetin Beta (MIRCERA IJ) Inject 1 Dose as directed every 30 (thirty) days. 02/02/23 02/01/24 Yes [provider]  metoprolol succinate (TOPROL-XL) 25 MG 24 hr tablet Take 1 tablet (25 mg total) by mouth daily. Patient taking differently: Take 12.5 mg by mouth daily. 08/06/22  Yes Rana Snare, DO  multivitamin (RENA-VIT) TABS tablet Take 1 tablet by mouth at bedtime.  12/11/17  Yes [provider]  nitroGLYCERIN (NITROSTAT) 0.4 MG SL tablet Place 0.4 mg under the tongue every 5 (five) minutes as needed for chest pain.   Yes [provider]  pantoprazole (PROTONIX) 40 MG tablet Take 40 mg by mouth at bedtime.   Yes [provider]  Tiotropium Bromide Monohydrate (SPIRIVA RESPIMAT) 1.25 MCG/ACT AERS Inhale 2 puffs into the lungs every morning.   Yes [provider]   Current Facility-Administered Medications  Medication Dose Route Frequency Provider Last Rate Last Admin   acetaminophen (TYLENOL) tablet 650 mg  650 mg Oral Q6H PRN Hall, Carole N, DO       albuterol (PROVENTIL) (2.5 MG/3ML) 0.083% nebulizer solution 3 mL  3 mL Inhalation Q6H PRN Margo Aye, Carole N, DO       ceFAZolin (ANCEF) IVPB 2g/100 mL premix  2 g Intravenous To OR London Sheer, MD       Chlorhexidine Gluconate Cloth 2 % PADS 6 each  6 each Topical Daily Djan, Scarlette Calico, MD       HYDROmorphone (DILAUDID) injection 0.5 mg  0.5 mg Intravenous Q4H PRN  Dow Adolph N, DO   0.5 mg at 03/12/23 2119   insulin aspart (novoLOG) injection 0-5 Units  0-5 Units Subcutaneous QHS Hall, Carole N, DO       insulin aspart (novoLOG) injection 0-6 Units  0-6 Units Subcutaneous TID WC Dow Adolph N, DO   1 Units at 03/13/23 2440   labetalol (NORMODYNE) injection 5 mg  5 mg Intravenous Q2H PRN Dow Adolph N, DO       melatonin tablet 5 mg  5 mg Oral  QHS PRN Darlin Drop, DO       multivitamin (RENA-VIT) tablet 1 tablet  1 tablet Oral QHS Dow Adolph N, DO   1 tablet at 03/12/23 2259   oxyCODONE (Oxy IR/ROXICODONE) immediate release tablet 5 mg  5 mg Oral Q6H PRN Dow Adolph N, DO       pantoprazole (PROTONIX) EC tablet 40 mg  40 mg Oral QHS Hall, Carole N, DO   40 mg at 03/12/23 2259   polyethylene glycol (MIRALAX / GLYCOLAX) packet 17 g  17 g Oral Daily PRN Dow Adolph N, DO       prochlorperazine (COMPAZINE) injection 5 mg  5 mg Intravenous Q6H PRN Dow Adolph N, DO       tranexamic acid (CYKLOKAPRON) IVPB 1,000 mg  1,000 mg Intravenous To OR London Sheer, MD       umeclidinium bromide (INCRUSE ELLIPTA) 62.5 MCG/ACT 1 puff  1 puff Inhalation Daily Darlin Drop, DO       Labs: Basic Metabolic Panel: Recent Labs  Lab 03/12/23 1755 03/12/23 1813  NA 140 137  K 4.0 3.8  CL 100 101  CO2 25  --   GLUCOSE 157* 155*  BUN 15 15  CREATININE 5.00* 5.50*  CALCIUM 9.6  --    CBC: Recent Labs  Lab 03/12/23 1702 03/12/23 1813  WBC 8.4  --   NEUTROABS 6.8  --   HGB 10.0* 10.9*  HCT 31.4* 32.0*  MCV 96.0  --   PLT 178  --    Studies/Results: MR ANGIO HEAD WO CONTRAST  Result Date: 03/13/2023 CLINICAL DATA:  Neuro deficit, acute, stroke suspected. EXAM: MRA HEAD WITHOUT CONTRAST TECHNIQUE: Angiographic images of the Circle of Willis were acquired using MRA technique without intravenous contrast. COMPARISON:  MRI brain 03/12/2023. FINDINGS: Anterior circulation: Motion degraded study. Within this limitation, no proximal large vessel  occlusion, aneurysm, or evidence of vascular malformation. Posterior circulation: Motion degraded study. Within this limitation, visualized portions of the vertebral arteries and basilar artery are patent without stenosis or aneurysm. The PICAs are patent proximally. The AICAs are poorly visualized but patent at the origins. The SCAs are patent proximally. PCAs are patent proximally without stenosis or evidence of aneurysm. Anatomic variants: None. Other: None. IMPRESSION: Motion degraded study. Within this limitation, no proximal large vessel occlusion or high-grade stenosis. Electronically Signed   By: Orvan Falconer M.D.   On: 03/13/2023 08:30   MR BRAIN WO CONTRAST  Result Date: 03/12/2023 CLINICAL DATA:  Stroke suspected, neuro deficit EXAM: MRI HEAD WITHOUT CONTRAST TECHNIQUE: Multiplanar, multiecho pulse sequences of the brain and surrounding structures were obtained without intravenous contrast. COMPARISON:  07/27/2020 MRI head, 03/12/2023 CT head FINDINGS: Evaluation is somewhat limited by motion artifact. Brain: Restricted diffusion with ADC correlate in the right brachium pontis, which is associated with increased T2 hyperintense signal, consistent with acute to early subacute infarct. No acute hemorrhage, mass, mass effect, or midline shift. No hydrocephalus or extra-axial collection. Normal pituitary and craniocervical junction. No hemosiderin deposition to suggest remote hemorrhage. Advanced cerebral atrophy for age. Ex vacuo dilatation of the ventricles. T2 hyperintense signal in the periventricular white matter, likely the sequela of chronic small vessel ischemic disease. Vascular: Normal arterial flow voids. Skull and upper cervical spine: Normal marrow signal. Sinuses/Orbits: Clear paranasal sinuses. No acute finding in the orbits. Status post bilateral lens replacements. Other: Trace fluid in the mastoid air cells. IMPRESSION: Acute to early subacute infarct in the right brachium pontis.  These results were called by telephone at the time of interpretation on 03/12/2023 at 8:37 pm to provider Alvino Blood , who verbally acknowledged these results. Electronically Signed   By: Wiliam Ke M.D.   On: 03/12/2023 20:37   DG FEMUR MIN 2 VIEWS LEFT  Result Date: 03/12/2023 CLINICAL DATA:  Left thigh pain after fall. EXAM: LEFT FEMUR 2 VIEWS COMPARISON:  None Available. FINDINGS: Moderately displaced intertrochanteric fracture is seen involving proximal left femur. Middle and distal portions left femur are unremarkable. IMPRESSION: Moderately displaced intertrochanteric fracture of proximal left femur. Electronically Signed   By: Lupita Raider M.D.   On: 03/12/2023 19:36   DG Hip Unilat W or Wo Pelvis 2-3 Views Left  Result Date: 03/12/2023 CLINICAL DATA:  Fall, left hip pain EXAM: DG HIP (WITH OR WITHOUT PELVIS) 2-3V LEFT COMPARISON:  06/09/2007 FINDINGS: Acute intertrochanteric fracture of the proximal left femur with varus angulation. Hip joint alignment is maintained without dislocation. No additional fractures are seen. Bony pelvis intact. Severe atherosclerotic vascular calcifications. IMPRESSION: Acute intertrochanteric fracture of the proximal left femur. Electronically Signed   By: Duanne Guess D.O.   On: 03/12/2023 16:56   CT Head Wo Contrast  Result Date: 03/12/2023 CLINICAL DATA:  Head trauma, minor (Age >= 65y) EXAM: CT HEAD WITHOUT CONTRAST TECHNIQUE: Contiguous axial images were obtained from the base of the skull through the vertex without intravenous contrast. RADIATION DOSE REDUCTION: This exam was performed according to the departmental dose-optimization program which includes automated exposure control, adjustment of the mA and/or kV according to patient size and/or use of iterative reconstruction technique. COMPARISON:  Head CT 11/20/2022 FINDINGS: Brain: No hemorrhage. No hydrocephalus. No extra-axial fluid collection. Asymmetric hypodensity in the medial  aspect of the right cerebellum and in the brachium pontis (series 3, image 9). No mass effect. No mass lesion. Mineralization of the basal ganglia bilaterally. Unchanged slightly hyperdense appearance of the falx Vascular: No hyperdense vessel or unexpected calcification. Skull: Normal. Negative for fracture or focal lesion. Sinuses/Orbits: No middle ear or mastoid effusion. Paranasal sinuses are clear. Bilateral lens replacement. Orbits are otherwise unremarkable. Other: None. IMPRESSION: 1. Asymmetric hypodensity in the medial aspect of the right cerebellum and in the brachium pontis, which could represent an age-indeterminate infarct. Recommend brain MRI for further evaluation. 2. No acute intracranial hemorrhage. Electronically Signed   By: Lorenza Cambridge M.D.   On: 03/12/2023 16:36   DG Chest 1 View  Result Date: 03/12/2023 CLINICAL DATA:  Fall.  History of CHF. EXAM: CHEST  1 VIEW COMPARISON:  Chest radiograph dated 01/15/2023 FINDINGS: Stable cardiomegaly. Stable right CVC catheter tip terminates in the right atrium. Both lungs are clear. No pneumothorax or pleural effusion. Left axillary vascular stent. No acute osseous abnormality. IMPRESSION: Cardiomegaly.  No acute findings in the chest. Electronically Signed   By: Hart Robinsons M.D.   On: 03/12/2023 16:34    ROS: All others negative except those listed in HPI.  Physical Exam: Vitals:   03/12/23 2051 03/13/23 0006 03/13/23 0448 03/13/23 0828  BP: (!) 180/84 (!) 140/94 (!) 165/97 (!) 160/72  Pulse: (!) 57 73 91 75  Resp: 18 18 19 17   Temp: 98.3 F (36.8 C)  98.9 F (37.2 C) 98.7 F (37.1 C)  TempSrc: Oral  Oral Oral  SpO2: 99% 95% 95% 96%     General: chronically ill appearing female in NAD Head: NCAT sclera not icteric MMM Neck: Supple. No lymphadenopathy Lungs: CTA bilaterally. No wheeze, rales or  rhonchi. Breathing is unlabored on O2 Heart: RRR. No murmur, rubs or gallops.  Abdomen: soft, nontender, +BS, no guarding, no  rebound tenderness  Lower extremities:no edema b/l Neuro: AAOx3. Moves all extremities spontaneously. Psych:  Responds to questions appropriately with a normal affect. Dialysis Access: [facility]  Dialysis Orders:  MWF  - [facility]  3.5hrs, BFR 300, DFR AF 1.5,  EDW 54.2kg, 2K/ 2Ca UFP 1  Access: AVF, TDC  Heparin none Mircera 30 mcg q4wks - last 10/7 Venofer 100mg  qHD x10 Hectorol IV qHD     Assessment/Plan:  L hip Frx - 2/2 Fall at home.  to have surgery today per ortho Stroke - MRI brain demonstrated a R brachium pontine stroke.  MRA with no acute findings. Neuro following.   ESRD -  on HD MWF. Orders written for HD today per regular schedule.  Work around surgery scheduled.   Hypertension/volume  - BP elevated, continue home meds.  Permissive hypertension w/new stroke. Does not appearing volume overloaded, meeting edw at outpatient. UF as tolerated.   Anemia of CKD - Hgb 10.9. ESA recently dosed.   Secondary Hyperparathyroidism -  Ca in goal. Check phos. Continue VDRA.  Nutrition - NPO. Renal diet w/fluid restrictions when advanced DMT2 Hx CAD   Virgina Norfolk, PA-C [facility] 03/13/2023, 9:49 AM

## 2023-03-13 NOTE — Progress Notes (Signed)
STROKE TEAM PROGRESS NOTE   SUBJECTIVE (INTERVAL HISTORY) No family is at the bedside.  Overall her condition is stable. Pt seen in HD unit, awake alert psychomotor slowing with intermittent perseveration. Pending left hip fracture surgery this afternoon. Now NPO status.    OBJECTIVE Temp:  [98 F (36.7 C)-98.9 F (37.2 C)] 98.4 F (36.9 C) (10/18 1330) Pulse Rate:  [57-103] 103 (10/18 1400) Cardiac Rhythm: Normal sinus rhythm (10/17 2230) Resp:  [15-34] 19 (10/18 1400) BP: (140-189)/(72-105) 157/89 (10/18 1400) SpO2:  [93 %-100 %] 100 % (10/18 1400) Weight:  [54.7 kg] 54.7 kg (10/18 1330)  Recent Labs  Lab 03/13/23 0006 03/13/23 0450 03/13/23 0824 03/13/23 1049 03/13/23 1217  GLUCAP 150* 174* 163* 86 92   Recent Labs  Lab 03/12/23 1755 03/12/23 1813 03/13/23 1051  NA 140 137 137  K 4.0 3.8 6.1*  CL 100 101 98  CO2 25  --   --   GLUCOSE 157* 155* 96  BUN 15 15 33*  CREATININE 5.00* 5.50* 7.00*  CALCIUM 9.6  --   --    No results for input(s): "AST", "ALT", "ALKPHOS", "BILITOT", "PROT", "ALBUMIN" in the last 168 hours. Recent Labs  Lab 03/12/23 1702 03/12/23 1813 03/13/23 1051 03/13/23 1238  WBC 8.4  --   --  9.1  NEUTROABS 6.8  --   --   --   HGB 10.0* 10.9* 12.6 10.8*  HCT 31.4* 32.0* 37.0 33.2*  MCV 96.0  --   --  94.3  PLT 178  --   --  189   No results for input(s): "CKTOTAL", "CKMB", "CKMBINDEX", "TROPONINI" in the last 168 hours. Recent Labs    03/12/23 1458  LABPROT 15.3*  INR 1.2   No results for input(s): "COLORURINE", "LABSPEC", "PHURINE", "GLUCOSEU", "HGBUR", "BILIRUBINUR", "KETONESUR", "PROTEINUR", "UROBILINOGEN", "NITRITE", "LEUKOCYTESUR" in the last 72 hours.  Invalid input(s): "APPERANCEUR"     Component Value Date/Time   CHOL 138 08/01/2022 0302   TRIG 192 (H) 08/01/2022 0302   HDL 60 08/01/2022 0302   CHOLHDL 2.3 08/01/2022 0302   VLDL 38 08/01/2022 0302   LDLCALC 40 08/01/2022 0302   Lab Results  Component Value Date    HGBA1C 7.7 (H) 03/13/2023      Component Value Date/Time   LABOPIA NONE DETECTED 10/17/2022 0638   COCAINSCRNUR NONE DETECTED 10/17/2022 0638   LABBENZ NONE DETECTED 10/17/2022 0638   AMPHETMU NONE DETECTED 10/17/2022 0638   THCU NONE DETECTED 10/17/2022 0638   LABBARB NONE DETECTED 10/17/2022 0638    No results for input(s): "ETH" in the last 168 hours.  I have personally reviewed the radiological images below and agree with the radiology interpretations.  MR ANGIO HEAD WO CONTRAST  Result Date: 03/13/2023 CLINICAL DATA:  Neuro deficit, acute, stroke suspected. EXAM: MRA HEAD WITHOUT CONTRAST TECHNIQUE: Angiographic images of the Circle of Willis were acquired using MRA technique without intravenous contrast. COMPARISON:  MRI brain 03/12/2023. FINDINGS: Anterior circulation: Motion degraded study. Within this limitation, no proximal large vessel occlusion, aneurysm, or evidence of vascular malformation. Posterior circulation: Motion degraded study. Within this limitation, visualized portions of the vertebral arteries and basilar artery are patent without stenosis or aneurysm. The PICAs are patent proximally. The AICAs are poorly visualized but patent at the origins. The SCAs are patent proximally. PCAs are patent proximally without stenosis or evidence of aneurysm. Anatomic variants: None. Other: None. IMPRESSION: Motion degraded study. Within this limitation, no proximal large vessel occlusion or high-grade stenosis. Electronically Signed  By: Orvan Falconer M.D.   On: 03/13/2023 08:30   MR BRAIN WO CONTRAST  Result Date: 03/12/2023 CLINICAL DATA:  Stroke suspected, neuro deficit EXAM: MRI HEAD WITHOUT CONTRAST TECHNIQUE: Multiplanar, multiecho pulse sequences of the brain and surrounding structures were obtained without intravenous contrast. COMPARISON:  07/27/2020 MRI head, 03/12/2023 CT head FINDINGS: Evaluation is somewhat limited by motion artifact. Brain: Restricted diffusion with  ADC correlate in the right brachium pontis, which is associated with increased T2 hyperintense signal, consistent with acute to early subacute infarct. No acute hemorrhage, mass, mass effect, or midline shift. No hydrocephalus or extra-axial collection. Normal pituitary and craniocervical junction. No hemosiderin deposition to suggest remote hemorrhage. Advanced cerebral atrophy for age. Ex vacuo dilatation of the ventricles. T2 hyperintense signal in the periventricular white matter, likely the sequela of chronic small vessel ischemic disease. Vascular: Normal arterial flow voids. Skull and upper cervical spine: Normal marrow signal. Sinuses/Orbits: Clear paranasal sinuses. No acute finding in the orbits. Status post bilateral lens replacements. Other: Trace fluid in the mastoid air cells. IMPRESSION: Acute to early subacute infarct in the right brachium pontis. These results were called by telephone at the time of interpretation on 03/12/2023 at 8:37 pm to provider Alvino Blood , who verbally acknowledged these results. Electronically Signed   By: Wiliam Ke M.D.   On: 03/12/2023 20:37   DG FEMUR MIN 2 VIEWS LEFT  Result Date: 03/12/2023 CLINICAL DATA:  Left thigh pain after fall. EXAM: LEFT FEMUR 2 VIEWS COMPARISON:  None Available. FINDINGS: Moderately displaced intertrochanteric fracture is seen involving proximal left femur. Middle and distal portions left femur are unremarkable. IMPRESSION: Moderately displaced intertrochanteric fracture of proximal left femur. Electronically Signed   By: Lupita Raider M.D.   On: 03/12/2023 19:36   DG Hip Unilat W or Wo Pelvis 2-3 Views Left  Result Date: 03/12/2023 CLINICAL DATA:  Fall, left hip pain EXAM: DG HIP (WITH OR WITHOUT PELVIS) 2-3V LEFT COMPARISON:  06/09/2007 FINDINGS: Acute intertrochanteric fracture of the proximal left femur with varus angulation. Hip joint alignment is maintained without dislocation. No additional fractures are seen. Bony  pelvis intact. Severe atherosclerotic vascular calcifications. IMPRESSION: Acute intertrochanteric fracture of the proximal left femur. Electronically Signed   By: Duanne Guess D.O.   On: 03/12/2023 16:56   CT Head Wo Contrast  Result Date: 03/12/2023 CLINICAL DATA:  Head trauma, minor (Age >= 65y) EXAM: CT HEAD WITHOUT CONTRAST TECHNIQUE: Contiguous axial images were obtained from the base of the skull through the vertex without intravenous contrast. RADIATION DOSE REDUCTION: This exam was performed according to the departmental dose-optimization program which includes automated exposure control, adjustment of the mA and/or kV according to patient size and/or use of iterative reconstruction technique. COMPARISON:  Head CT 11/20/2022 FINDINGS: Brain: No hemorrhage. No hydrocephalus. No extra-axial fluid collection. Asymmetric hypodensity in the medial aspect of the right cerebellum and in the brachium pontis (series 3, image 9). No mass effect. No mass lesion. Mineralization of the basal ganglia bilaterally. Unchanged slightly hyperdense appearance of the falx Vascular: No hyperdense vessel or unexpected calcification. Skull: Normal. Negative for fracture or focal lesion. Sinuses/Orbits: No middle ear or mastoid effusion. Paranasal sinuses are clear. Bilateral lens replacement. Orbits are otherwise unremarkable. Other: None. IMPRESSION: 1. Asymmetric hypodensity in the medial aspect of the right cerebellum and in the brachium pontis, which could represent an age-indeterminate infarct. Recommend brain MRI for further evaluation. 2. No acute intracranial hemorrhage. Electronically Signed   By: Lorenza Cambridge  M.D.   On: 03/12/2023 16:36   DG Chest 1 View  Result Date: 03/12/2023 CLINICAL DATA:  Fall.  History of CHF. EXAM: CHEST  1 VIEW COMPARISON:  Chest radiograph dated 01/15/2023 FINDINGS: Stable cardiomegaly. Stable right CVC catheter tip terminates in the right atrium. Both lungs are clear. No  pneumothorax or pleural effusion. Left axillary vascular stent. No acute osseous abnormality. IMPRESSION: Cardiomegaly.  No acute findings in the chest. Electronically Signed   By: Hart Robinsons M.D.   On: 03/12/2023 16:34     PHYSICAL EXAM  Temp:  [98 F (36.7 C)-98.9 F (37.2 C)] 98.4 F (36.9 C) (10/18 1330) Pulse Rate:  [57-103] 103 (10/18 1400) Resp:  [15-34] 19 (10/18 1400) BP: (140-189)/(72-105) 157/89 (10/18 1400) SpO2:  [93 %-100 %] 100 % (10/18 1400) Weight:  [54.7 kg] 54.7 kg (10/18 1330)  General - Well nourished, well developed, in no apparent distress.  Ophthalmologic - fundi not visualized due to noncooperation.  Cardiovascular - irregularly irregular heart rate and rhythm.  Neuro - awake, alert, eyes open, orientated to age but not able to speak "hospital" out but correct on choices, not orientated to time. No aphasia, able to ask questions in short sentences, however, psychomotor slowing with intermittent perseveration, following all simple commands. Able to name 2/4 and repeat in mildly dysarthric voice. No gaze palsy, tracking bilaterally, visual field full. No facial droop. Tongue midline. Bilateral UEs 4/5, no drift. RLE 3/5 no drift, LLE not able to check due to hip fracture and subjective pain. 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 Sensation symmetrical bilaterally subjectively, b/l FTN intact grossly, gait not tested.    ASSESSMENT/PLAN Robin [name] is a 71 y.o. female with history of CAD on plavix, DM, HTN, HLD, OSA, ESRD on HD, Afib on eliquis admitted for fall with left hip fracture. But also found to have right cerebellar peduncle infarct. No tPA given due to outside window.    Stroke:  right cerebellar peduncle infarct, likely secondary to small vessel disease source CT head concerning for right cerebellar infarct MRI  right cerebellar peduncle infarct MRA  Motion degraded study. Within this limitation, no proximal  large vessel occlusion or high-grade stenosis. Carotid Doppler  pending 2D Echo  pending LDL pending HgbA1c 7.7 SCDs for VTE prophylaxis clopidogrel 75 mg daily and Eliquis (apixaban) daily prior to admission, now on No antithrombotic given surgery later today and NPO status. Recommend to resume AC and plavix post surgery once hemodynamically stable and clinically appropriate.  Ongoing aggressive stroke risk factor management Therapy recommendations:  pending Disposition:  pending  Left hip fracture Suspect current stroke caused her fall which made left hip fracture Orthopedics on board, plan for surgery this afternoon Now NPO Recommend to resume AC and plavix post surgery once hemodynamically stable and clinically appropriate.  Chronic afib On home eliquis Now eliquis on hold given upcoming surgery Recommend to resume [facility] post op once hemodynamically stable May consider heparin IV bridge if needed.  Diabetes HgbA1c 7.7 goal < 7.0 Uncontrolled CBG monitoring SSI DM education and close PCP follow up  Hypertension Stable Avoid hypotension perioperatively Long term BP goal normotensive  Hyperlipidemia Home meds:  none  LDL pending, goal < 70 Now NPO for OR today Consider statin if needed  Other Stroke Risk Factors Advanced age CAD on plavix  OSA  Other Active Problems ESRD On HD  Hospital day # 1    Marvel Plan, MD PhD Stroke Neurology  03/13/2023 2:12 PM    To contact Stroke Continuity provider, please refer to [email or web address]. After hours, contact General Neurology

## 2023-03-13 NOTE — Plan of Care (Signed)
  Problem: Education: Goal: Understanding of post-operative needs will improve Outcome: Progressing Goal: Individualized Educational Video(s) Outcome: Progressing   Problem: Clinical Measurements: Goal: Postoperative complications will be avoided or minimized Outcome: Progressing   Problem: Respiratory: Goal: Will regain and/or maintain adequate ventilation Outcome: Progressing   Problem: Education: Goal: Knowledge of General Education information will improve Description: Including pain rating scale, medication(s)/side effects and non-pharmacologic comfort measures Outcome: Progressing   Problem: Health Behavior/Discharge Planning: Goal: Ability to manage health-related needs will improve Outcome: Progressing   Problem: Clinical Measurements: Goal: Ability to maintain clinical measurements within normal limits will improve Outcome: Progressing Goal: Will remain free from infection Outcome: Progressing Goal: Diagnostic test results will improve Outcome: Progressing Goal: Respiratory complications will improve Outcome: Progressing Goal: Cardiovascular complication will be avoided Outcome: Progressing   Problem: Activity: Goal: Risk for activity intolerance will decrease Outcome: Progressing   Problem: Nutrition: Goal: Adequate nutrition will be maintained Outcome: Progressing   Problem: Coping: Goal: Level of anxiety will decrease Outcome: Progressing   Problem: Elimination: Goal: Will not experience complications related to bowel motility Outcome: Progressing Goal: Will not experience complications related to urinary retention Outcome: Progressing   Problem: Pain Managment: Goal: General experience of comfort will improve Outcome: Progressing   Problem: Safety: Goal: Ability to remain free from injury will improve Outcome: Progressing   Problem: Skin Integrity: Goal: Risk for impaired skin integrity will decrease Outcome: Progressing   Problem:  Education: Goal: Ability to describe self-care measures that may prevent or decrease complications (Diabetes Survival Skills Education) will improve Outcome: Progressing Goal: Individualized Educational Video(s) Outcome: Progressing   Problem: Coping: Goal: Ability to adjust to condition or change in health will improve Outcome: Progressing   Problem: Fluid Volume: Goal: Ability to maintain a balanced intake and output will improve Outcome: Progressing   Problem: Health Behavior/Discharge Planning: Goal: Ability to identify and utilize available resources and services will improve Outcome: Progressing Goal: Ability to manage health-related needs will improve Outcome: Progressing   Problem: Metabolic: Goal: Ability to maintain appropriate glucose levels will improve Outcome: Progressing   Problem: Nutritional: Goal: Maintenance of adequate nutrition will improve Outcome: Progressing Goal: Progress toward achieving an optimal weight will improve Outcome: Progressing   Problem: Skin Integrity: Goal: Risk for impaired skin integrity will decrease Outcome: Progressing   Problem: Tissue Perfusion: Goal: Adequacy of tissue perfusion will improve Outcome: Progressing

## 2023-03-13 NOTE — Progress Notes (Signed)
Patient to go to OR. Not able to give CHG bath.  Phone report given to [employer] in [employer].  Daughter not at bedside to obtain consent as patient is confused and not able to be consented.

## 2023-03-13 NOTE — Progress Notes (Signed)
Received patient in bed.Alert and oriented x 2,person and place.Called patient's daughter for verbal consent for her HD treatment.  Medication given : Hectorol  Access used used: Right HD catheter that worked well.Dressing change done today.  Duration of treatment 3.5 hours.  Fluid removed : 2 liters.  Hemo comment: Tolerated treatment well.  Hand off to the patient's nurse.Patient transported  on stable medical condition via transporter CBG (last 3)  Recent Labs    03/13/23 1049 03/13/23 1217 03/13/23 1749  GLUCAP 86 92 93   98

## 2023-03-14 ENCOUNTER — Other Ambulatory Visit: Payer: Self-pay

## 2023-03-14 ENCOUNTER — Encounter (HOSPITAL_COMMUNITY): Admission: EM | Disposition: E | Payer: Self-pay | Source: Home / Self Care | Attending: Family Medicine

## 2023-03-14 ENCOUNTER — Inpatient Hospital Stay (HOSPITAL_COMMUNITY): Payer: No Typology Code available for payment source

## 2023-03-14 ENCOUNTER — Inpatient Hospital Stay (HOSPITAL_COMMUNITY): Payer: No Typology Code available for payment source | Admitting: Anesthesiology

## 2023-03-14 ENCOUNTER — Encounter (HOSPITAL_COMMUNITY): Payer: Self-pay | Admitting: Internal Medicine

## 2023-03-14 DIAGNOSIS — F17210 Nicotine dependence, cigarettes, uncomplicated: Secondary | ICD-10-CM

## 2023-03-14 DIAGNOSIS — S72142A Displaced intertrochanteric fracture of left femur, initial encounter for closed fracture: Secondary | ICD-10-CM

## 2023-03-14 DIAGNOSIS — I2510 Atherosclerotic heart disease of native coronary artery without angina pectoris: Secondary | ICD-10-CM

## 2023-03-14 HISTORY — PX: INTRAMEDULLARY (IM) NAIL INTERTROCHANTERIC: SHX5875

## 2023-03-14 LAB — CBC
HCT: 37 % (ref 36.0–46.0)
Hemoglobin: 12.1 g/dL (ref 12.0–15.0)
MCH: 31.1 pg (ref 26.0–34.0)
MCHC: 32.7 g/dL (ref 30.0–36.0)
MCV: 95.1 fL (ref 80.0–100.0)
Platelets: 185 10*3/uL (ref 150–400)
RBC: 3.89 MIL/uL (ref 3.87–5.11)
RDW: 18.2 % — ABNORMAL HIGH (ref 11.5–15.5)
WBC: 12.4 10*3/uL — ABNORMAL HIGH (ref 4.0–10.5)
nRBC: 0 % (ref 0.0–0.2)

## 2023-03-14 LAB — BASIC METABOLIC PANEL
Anion gap: 30 — ABNORMAL HIGH (ref 5–15)
BUN: 16 mg/dL (ref 8–23)
CO2: 17 mmol/L — ABNORMAL LOW (ref 22–32)
Calcium: 10 mg/dL (ref 8.9–10.3)
Chloride: 88 mmol/L — ABNORMAL LOW (ref 98–111)
Creatinine, Ser: 4.2 mg/dL — ABNORMAL HIGH (ref 0.44–1.00)
GFR, Estimated: 11 mL/min — ABNORMAL LOW (ref >60)
Glucose, Bld: 252 mg/dL — ABNORMAL HIGH (ref 70–99)
Potassium: 3.8 mmol/L (ref 3.5–5.1)
Sodium: 135 mmol/L (ref 135–145)

## 2023-03-14 LAB — HEPATITIS B SURFACE ANTIBODY, QUANTITATIVE: Hep B S AB Quant (Post): 4.3 m[IU]/mL — ABNORMAL LOW

## 2023-03-14 LAB — GLUCOSE, CAPILLARY
Glucose-Capillary: 248 mg/dL — ABNORMAL HIGH (ref 70–99)
Glucose-Capillary: 200 mg/dL — ABNORMAL HIGH (ref 70–99)
Glucose-Capillary: 181 mg/dL — ABNORMAL HIGH (ref 70–99)
Glucose-Capillary: 275 mg/dL — ABNORMAL HIGH (ref 70–99)
Glucose-Capillary: 139 mg/dL — ABNORMAL HIGH (ref 70–99)

## 2023-03-14 LAB — MAGNESIUM: Magnesium: 2 mg/dL (ref 1.7–2.4)

## 2023-03-14 LAB — PHOSPHORUS: Phosphorus: 7 mg/dL — ABNORMAL HIGH (ref 2.5–4.6)

## 2023-03-14 SURGERY — FIXATION, FRACTURE, INTERTROCHANTERIC, WITH INTRAMEDULLARY ROD
Anesthesia: General | Laterality: Left

## 2023-03-14 MED ORDER — LIDOCAINE 2% (20 MG/ML) 5 ML SYRINGE
INTRAMUSCULAR | Status: DC | PRN
  Administered 2023-03-14: 60 mg via INTRAVENOUS

## 2023-03-14 MED ORDER — ONDANSETRON HCL 4 MG/2ML IJ SOLN
INTRAMUSCULAR | Status: DC | PRN
  Administered 2023-03-14: 4 mg via INTRAVENOUS

## 2023-03-14 MED ORDER — INSULIN ASPART 100 UNIT/ML IJ SOLN
0.0000 [IU] | INTRAMUSCULAR | Status: DC | PRN
  Administered 2023-03-14: 3 [IU] via SUBCUTANEOUS

## 2023-03-14 MED ORDER — OXYCODONE HCL 5 MG PO TABS
2.5000 mg | ORAL_TABLET | Freq: Four times a day (QID) | ORAL | Status: DC | PRN
  Administered 2023-03-15: 2.5 mg via ORAL
  Filled 2023-03-14 (×2): qty 1

## 2023-03-14 MED ORDER — FENTANYL CITRATE (PF) 100 MCG/2ML IJ SOLN: 25.0000 ug | INTRAMUSCULAR | Status: DC | PRN

## 2023-03-14 MED ORDER — TRANEXAMIC ACID-NACL 1000-0.7 MG/100ML-% IV SOLN
1000.0000 mg | INTRAVENOUS | Status: AC
  Administered 2023-03-14: 1000 mg via INTRAVENOUS

## 2023-03-14 MED ORDER — ROCURONIUM BROMIDE 10 MG/ML (PF) SYRINGE
PREFILLED_SYRINGE | INTRAVENOUS | Status: AC
  Filled 2023-03-14: qty 10

## 2023-03-14 MED ORDER — CHLORHEXIDINE GLUCONATE 0.12 % MT SOLN
OROMUCOSAL | Status: AC
  Filled 2023-03-14: qty 15

## 2023-03-14 MED ORDER — PHENYLEPHRINE 80 MCG/ML (10ML) SYRINGE FOR IV PUSH (FOR BLOOD PRESSURE SUPPORT)
PREFILLED_SYRINGE | INTRAVENOUS | Status: AC
  Filled 2023-03-14: qty 10

## 2023-03-14 MED ORDER — OXYCODONE HCL 5 MG PO TABS: 5.0000 mg | ORAL_TABLET | Freq: Once | ORAL | Status: DC | PRN

## 2023-03-14 MED ORDER — LIDOCAINE 2% (20 MG/ML) 5 ML SYRINGE
INTRAMUSCULAR | Status: AC
  Filled 2023-03-14: qty 5

## 2023-03-14 MED ORDER — CEFAZOLIN SODIUM-DEXTROSE 2-4 GM/100ML-% IV SOLN
2.0000 g | INTRAVENOUS | Status: AC
  Administered 2023-03-14: 2 g via INTRAVENOUS

## 2023-03-14 MED ORDER — CEFAZOLIN SODIUM-DEXTROSE 2-4 GM/100ML-% IV SOLN
INTRAVENOUS | Status: AC
  Filled 2023-03-14: qty 100

## 2023-03-14 MED ORDER — PROPOFOL 10 MG/ML IV BOLUS
INTRAVENOUS | Status: DC | PRN
  Administered 2023-03-14: 110 mg via INTRAVENOUS

## 2023-03-14 MED ORDER — HYDROMORPHONE HCL 1 MG/ML IJ SOLN: 0.5000 mg | INTRAMUSCULAR | Status: AC | PRN

## 2023-03-14 MED ORDER — SODIUM CHLORIDE 0.9% FLUSH
10.0000 mL | Freq: Once | INTRAVENOUS | Status: AC
  Administered 2023-03-14: 10 mL via INTRAVENOUS

## 2023-03-14 MED ORDER — SEVELAMER CARBONATE 800 MG PO TABS
800.0000 mg | ORAL_TABLET | Freq: Three times a day (TID) | ORAL | Status: DC
  Administered 2023-03-14: 800 mg via ORAL
  Filled 2023-03-14 (×2): qty 1

## 2023-03-14 MED ORDER — PHENYLEPHRINE 80 MCG/ML (10ML) SYRINGE FOR IV PUSH (FOR BLOOD PRESSURE SUPPORT)
PREFILLED_SYRINGE | INTRAVENOUS | Status: DC | PRN
  Administered 2023-03-14: 80 ug via INTRAVENOUS
  Administered 2023-03-14: 240 ug via INTRAVENOUS
  Administered 2023-03-14: 160 ug via INTRAVENOUS
  Administered 2023-03-14: 80 ug via INTRAVENOUS
  Administered 2023-03-14: 240 ug via INTRAVENOUS
  Administered 2023-03-14: 160 ug via INTRAVENOUS

## 2023-03-14 MED ORDER — PHENYLEPHRINE HCL (PRESSORS) 10 MG/ML IV SOLN
INTRAVENOUS | Status: AC
  Filled 2023-03-14: qty 1

## 2023-03-14 MED ORDER — ONDANSETRON HCL 4 MG/2ML IJ SOLN
INTRAMUSCULAR | Status: AC
  Filled 2023-03-14: qty 2

## 2023-03-14 MED ORDER — SODIUM CHLORIDE 0.9 % IV SOLN
INTRAVENOUS | Status: DC | PRN
Start: 2023-03-14 — End: 2023-03-14

## 2023-03-14 MED ORDER — SUCCINYLCHOLINE CHLORIDE 200 MG/10ML IV SOSY
PREFILLED_SYRINGE | INTRAVENOUS | Status: AC
  Filled 2023-03-14: qty 10

## 2023-03-14 MED ORDER — METOPROLOL TARTRATE 5 MG/5ML IV SOLN
INTRAVENOUS | Status: AC
  Filled 2023-03-14: qty 5

## 2023-03-14 MED ORDER — METOPROLOL TARTRATE 5 MG/5ML IV SOLN
INTRAVENOUS | Status: DC | PRN
Start: 2023-03-14 — End: 2023-03-14
  Administered 2023-03-14 (×2): 1 mg via INTRAVENOUS

## 2023-03-14 MED ORDER — ROCURONIUM BROMIDE 10 MG/ML (PF) SYRINGE
PREFILLED_SYRINGE | INTRAVENOUS | Status: DC | PRN
  Administered 2023-03-14: 40 mg via INTRAVENOUS

## 2023-03-14 MED ORDER — PHENYLEPHRINE HCL-NACL 20-0.9 MG/250ML-% IV SOLN
INTRAVENOUS | Status: DC | PRN
Start: 2023-03-14 — End: 2023-03-14
  Administered 2023-03-14: 40 ug/min via INTRAVENOUS

## 2023-03-14 MED ORDER — FENTANYL CITRATE (PF) 250 MCG/5ML IJ SOLN
INTRAMUSCULAR | Status: DC | PRN
  Administered 2023-03-14: 75 ug via INTRAVENOUS

## 2023-03-14 MED ORDER — TRANEXAMIC ACID-NACL 1000-0.7 MG/100ML-% IV SOLN: 1000.0000 mg | Freq: Once | INTRAVENOUS | Status: DC

## 2023-03-14 MED ORDER — OXYCODONE HCL 5 MG/5ML PO SOLN: 5.0000 mg | Freq: Once | ORAL | Status: DC | PRN

## 2023-03-14 MED ORDER — PROPOFOL 10 MG/ML IV BOLUS
INTRAVENOUS | Status: AC
  Filled 2023-03-14: qty 20

## 2023-03-14 MED ORDER — TRANEXAMIC ACID-NACL 1000-0.7 MG/100ML-% IV SOLN
INTRAVENOUS | Status: AC
  Filled 2023-03-14: qty 100

## 2023-03-14 MED ORDER — SUGAMMADEX SODIUM 200 MG/2ML IV SOLN
INTRAVENOUS | Status: DC | PRN
  Administered 2023-03-14: 200 mg via INTRAVENOUS

## 2023-03-14 MED ORDER — ORAL CARE MOUTH RINSE: 15.0000 mL | Freq: Once | OROMUCOSAL | Status: DC

## 2023-03-14 MED ORDER — CHLORHEXIDINE GLUCONATE 0.12 % MT SOLN: 15.0000 mL | Freq: Once | OROMUCOSAL | Status: DC

## 2023-03-14 MED ORDER — FENTANYL CITRATE (PF) 250 MCG/5ML IJ SOLN
INTRAMUSCULAR | Status: AC
  Filled 2023-03-14: qty 5

## 2023-03-14 MED ORDER — CEFAZOLIN SODIUM-DEXTROSE 2-4 GM/100ML-% IV SOLN
2.0000 g | Freq: Three times a day (TID) | INTRAVENOUS | Status: AC
  Administered 2023-03-14 (×2): 2 g via INTRAVENOUS
  Filled 2023-03-14 (×2): qty 100

## 2023-03-14 SURGICAL SUPPLY — 36 items
BIT DRILL INTERTAN LAG SCREW (BIT) IMPLANT
BIT DRILL LONG 4.0 (BIT) IMPLANT
BRUSH SCRUB EZ PLAIN DRY (MISCELLANEOUS) ×2 IMPLANT
COVER PERINEAL POST (MISCELLANEOUS) ×1 IMPLANT
COVER SURGICAL LIGHT HANDLE (MISCELLANEOUS) ×2 IMPLANT
DRAPE C-ARM 42X72 X-RAY (DRAPES) ×1 IMPLANT
DRAPE C-ARMOR (DRAPES) ×1 IMPLANT
DRAPE INCISE IOBAN 66X45 STRL (DRAPES) ×1 IMPLANT
DRAPE STERI IOBAN 125X83 (DRAPES) IMPLANT
DRILL BIT LONG 4.0 (BIT) ×1
DRSG TEGADERM 4X4.75 (GAUZE/BANDAGES/DRESSINGS) IMPLANT
GAUZE SPONGE 4X4 12PLY STRL (GAUZE/BANDAGES/DRESSINGS) IMPLANT
GLOVE INDICATOR 7.5 STRL GRN (GLOVE) IMPLANT
GLOVE SKINSENSE STRL SZ8.0 LF (GLOVE) IMPLANT
GOWN STRL REUS W/ TWL LRG LVL3 (GOWN DISPOSABLE) ×2 IMPLANT
GOWN STRL REUS W/ TWL XL LVL3 (GOWN DISPOSABLE) ×1 IMPLANT
GOWN STRL REUS W/TWL LRG LVL3 (GOWN DISPOSABLE) ×1
GOWN STRL REUS W/TWL XL LVL3 (GOWN DISPOSABLE) ×1
GUIDE PIN 3.2X343 (PIN) ×2
GUIDE PIN 3.2X343MM (PIN) ×2
GUIDE ROD 3.0 (MISCELLANEOUS) ×1
KIT BASIN OR (CUSTOM PROCEDURE TRAY) ×1 IMPLANT
KIT TURNOVER KIT B (KITS) ×1 IMPLANT
NAIL TRIGEN INTERTAN 10X18CM (Nail) IMPLANT
PACK GENERAL/GYN (CUSTOM PROCEDURE TRAY) ×1 IMPLANT
PAD ARMBOARD 7.5X6 YLW CONV (MISCELLANEOUS) ×2 IMPLANT
PIN GUIDE 3.2X343MM (PIN) IMPLANT
ROD GUIDE 3.0 (MISCELLANEOUS) IMPLANT
SCREW LAG COMPR KIT 90/85 (Screw) IMPLANT
SCREW TRIGEN LOW PROF 5.0X32.5 (Screw) IMPLANT
STAPLER VISISTAT 35W (STAPLE) IMPLANT
SUT VIC AB 0 CT1 18XCR BRD 8 (SUTURE) IMPLANT
SUT VIC AB 0 CT1 8-18 (SUTURE) ×1
SUT VIC AB 2-0 CT2 18 VCP726D (SUTURE) IMPLANT
TOWEL GREEN STERILE (TOWEL DISPOSABLE) ×2 IMPLANT
WATER STERILE IRR 1000ML POUR (IV SOLUTION) ×1 IMPLANT

## 2023-03-14 NOTE — Transfer of Care (Signed)
Immediate Anesthesia Transfer of Care Note  Patient: Robin [name]  Procedure(s) Performed: INTRAMEDULLARY (IM) NAIL INTERTROCHANTERIC (Left)  Patient Location: PACU  Anesthesia Type:General  Level of Consciousness: drowsy and patient cooperative  Airway & Oxygen Therapy: Patient Spontanous Breathing and Patient connected to face mask oxygen  Post-op Assessment: Report given to RN and Post -op Vital signs reviewed and stable  Post vital signs: Reviewed and stable  Last Vitals:  Vitals Value Taken Time  BP 183/122 03/14/23 0926  Temp    Pulse 79 03/14/23 0929  Resp 23 03/14/23 0929  SpO2 100 % 03/14/23 0929  Vitals shown include unfiled device data.  Last Pain:  Vitals:   03/14/23 0656  TempSrc: Oral  PainSc:          Complications: No notable events documented.

## 2023-03-14 NOTE — Progress Notes (Signed)
STROKE TEAM PROGRESS NOTE   SUBJECTIVE (INTERVAL HISTORY) Multiple family members are at the bedside.  She had left hip replacement surgery today for L hip fracture.  She is sitting up in bed and being fed her lunch.  Overall her condition is stable.  She is awake alert psychomotor slowing with intermittent perseveration.  Vital signs stable.  Neurological exam unchanged.   OBJECTIVE Temp:  [97.4 F (36.3 C)-99.7 F (37.6 C)] 97.5 F (36.4 C) (10/19 1100) Pulse Rate:  [37-133] 93 (10/19 1100) Cardiac Rhythm: Normal sinus rhythm (10/19 0926) Resp:  [11-28] 18 (10/19 1100) BP: (108-193)/(50-123) 114/88 (10/19 1100) SpO2:  [76 %-100 %] 97 % (10/19 1100) Weight:  [52.9 kg] 52.9 kg (10/19 0713)  Recent Labs  Lab 03/13/23 1749 03/13/23 1946 03/14/23 0703 03/14/23 0948 03/14/23 1111  GLUCAP 93 135* 248* 200* 181*   Recent Labs  Lab 03/12/23 1755 03/12/23 1813 03/13/23 1051 03/13/23 1238 03/13/23 1239 03/13/23 1300 03/14/23 0448  NA 140 137 137 140  --  140 135  K 4.0 3.8 6.1* 4.4  --  4.4 3.8  CL 100 101 98 97*  --  97* 88*  CO2 25  --   --  24  --  25 17*  GLUCOSE 157* 155* 96 96  --  112* 252*  BUN 15 15 33* 22  --  23 16  CREATININE 5.00* 5.50* 7.00* 6.37*  --  6.66* 4.20*  CALCIUM 9.6  --   --  9.9  --  9.5 10.0  MG  --   --   --   --   --   --  2.0  PHOS  --   --   --  6.8* 6.7* 6.7* 7.0*   Recent Labs  Lab 03/13/23 1238 03/13/23 1300  ALBUMIN 3.1* 3.1*   Recent Labs  Lab 03/12/23 1702 03/12/23 1813 03/13/23 1051 03/13/23 1238 03/13/23 1300 03/14/23 0448  WBC 8.4  --   --  9.1 9.4 12.4*  NEUTROABS 6.8  --   --   --   --   --   HGB 10.0* 10.9* 12.6 10.8* 10.4* 12.1  HCT 31.4* 32.0* 37.0 33.2* 32.2* 37.0  MCV 96.0  --   --  94.3 97.0 95.1  PLT 178  --   --  189 180 185   No results for input(s): "CKTOTAL", "CKMB", "CKMBINDEX", "TROPONINI" in the last 168 hours. Recent Labs    03/12/23 1458  LABPROT 15.3*  INR 1.2   No results for input(s):  "COLORURINE", "LABSPEC", "PHURINE", "GLUCOSEU", "HGBUR", "BILIRUBINUR", "KETONESUR", "PROTEINUR", "UROBILINOGEN", "NITRITE", "LEUKOCYTESUR" in the last 72 hours.  Invalid input(s): "APPERANCEUR"     Component Value Date/Time   CHOL 189 03/13/2023 1238   TRIG 74 03/13/2023 1238   HDL 69 03/13/2023 1238   CHOLHDL 2.7 03/13/2023 1238   VLDL 15 03/13/2023 1238   LDLCALC 105 (H) 03/13/2023 1238   Lab Results  Component Value Date   HGBA1C 7.7 (H) 03/13/2023      Component Value Date/Time   LABOPIA NONE DETECTED 10/17/2022 0638   COCAINSCRNUR NONE DETECTED 10/17/2022 0638   LABBENZ NONE DETECTED 10/17/2022 0638   AMPHETMU NONE DETECTED 10/17/2022 0638   THCU NONE DETECTED 10/17/2022 0638   LABBARB NONE DETECTED 10/17/2022 0638    No results for input(s): "ETH" in the last 168 hours.  I have personally reviewed the radiological images below and agree with the radiology interpretations.  DG FEMUR MIN 2 VIEWS LEFT  Result  Date: 03/14/2023 CLINICAL DATA:  Status post intramedullary nail intertrochanteric left femur. EXAM: LEFT FEMUR 2 VIEWS COMPARISON:  Left femur radiographs 03/12/2023, left hip radiographs 03/12/2023 FINDINGS: There is diffuse decreased bone mineralization. Interval cephalomedullary nail fixation of the previously seen left intradural trochanteric proximal femoral fracture. No evidence of hardware complication. Expected postoperative changes including lateral left hip subcutaneous air and surgical skin staples. High-grade atherosclerotic calcifications. Vascular stents are seen within the superficial femoral artery. IMPRESSION: Interval cephalomedullary nail fixation of the previously seen left intertrochanteric proximal femoral fracture. No evidence of hardware complication. Electronically Signed   By: Neita Garnet M.D.   On: 03/14/2023 13:23   DG HIP UNILAT WITH PELVIS 2-3 VIEWS LEFT  Result Date: 03/14/2023 CLINICAL DATA:  SURGERY, ELECTIVE.  INTRAMEDULLARY NAIL.  EXAM: DG HIP (WITH OR WITHOUT PELVIS) 4V LEFT COMPARISON:  LEFT HIP RADIOGRAPHS 03/12/2023 FLUOROSCOPY: Exposure Index (as provided by the fluoroscopic device): PATIENT'S 6.95 4 INTRAOPERATIVE IMAGES DEMONSTRATE REDUCTION OF THE INTERTROCHANTERIC FRACTURE. AND INTRAMEDULLARY ROD WAS PLACED. TWO PROXIMAL DYNAMIC SCREWS ARE PRESENT. A SINGLE DISTAL INTERLOCKING SCREW IS PRESENT. VASCULAR CALCIFICATIONS ARE PRESENT. FINDINGS: Four intraoperative images demonstrate reduction of the intertrochanteric fracture. An intramedullary rod was placed. Two proximal interlocking screws are present. A single distal interlocking screw is present. Vascular calcifications are present. IMPRESSION: 1. ORIF of left intertrochanteric fracture without radiographic evidence for complication. 2. Atherosclerosis. Electronically Signed   By: Marin Roberts M.D.   On: 03/14/2023 13:00   DG C-Arm 1-60 Min-No Report  Result Date: 03/14/2023 Fluoroscopy was utilized by the requesting physician.  No radiographic interpretation.   DG C-Arm 1-60 Min-No Report  Result Date: 03/14/2023 Fluoroscopy was utilized by the requesting physician.  No radiographic interpretation.   MR ANGIO HEAD WO CONTRAST  Result Date: 03/13/2023 CLINICAL DATA:  Neuro deficit, acute, stroke suspected. EXAM: MRA HEAD WITHOUT CONTRAST TECHNIQUE: Angiographic images of the Circle of Willis were acquired using MRA technique without intravenous contrast. COMPARISON:  MRI brain 03/12/2023. FINDINGS: Anterior circulation: Motion degraded study. Within this limitation, no proximal large vessel occlusion, aneurysm, or evidence of vascular malformation. Posterior circulation: Motion degraded study. Within this limitation, visualized portions of the vertebral arteries and basilar artery are patent without stenosis or aneurysm. The PICAs are patent proximally. The AICAs are poorly visualized but patent at the origins. The SCAs are patent proximally. PCAs are patent  proximally without stenosis or evidence of aneurysm. Anatomic variants: None. Other: None. IMPRESSION: Motion degraded study. Within this limitation, no proximal large vessel occlusion or high-grade stenosis. Electronically Signed   By: Orvan Falconer M.D.   On: 03/13/2023 08:30   MR BRAIN WO CONTRAST  Result Date: 03/12/2023 CLINICAL DATA:  Stroke suspected, neuro deficit EXAM: MRI HEAD WITHOUT CONTRAST TECHNIQUE: Multiplanar, multiecho pulse sequences of the brain and surrounding structures were obtained without intravenous contrast. COMPARISON:  07/27/2020 MRI head, 03/12/2023 CT head FINDINGS: Evaluation is somewhat limited by motion artifact. Brain: Restricted diffusion with ADC correlate in the right brachium pontis, which is associated with increased T2 hyperintense signal, consistent with acute to early subacute infarct. No acute hemorrhage, mass, mass effect, or midline shift. No hydrocephalus or extra-axial collection. Normal pituitary and craniocervical junction. No hemosiderin deposition to suggest remote hemorrhage. Advanced cerebral atrophy for age. Ex vacuo dilatation of the ventricles. T2 hyperintense signal in the periventricular white matter, likely the sequela of chronic small vessel ischemic disease. Vascular: Normal arterial flow voids. Skull and upper cervical spine: Normal marrow signal. Sinuses/Orbits: Clear paranasal sinuses. No  acute finding in the orbits. Status post bilateral lens replacements. Other: Trace fluid in the mastoid air cells. IMPRESSION: Acute to early subacute infarct in the right brachium pontis. These results were called by telephone at the time of interpretation on 03/12/2023 at 8:37 pm to provider Alvino Blood , who verbally acknowledged these results. Electronically Signed   By: Wiliam Ke M.D.   On: 03/12/2023 20:37   DG FEMUR MIN 2 VIEWS LEFT  Result Date: 03/12/2023 CLINICAL DATA:  Left thigh pain after fall. EXAM: LEFT FEMUR 2 VIEWS COMPARISON:   None Available. FINDINGS: Moderately displaced intertrochanteric fracture is seen involving proximal left femur. Middle and distal portions left femur are unremarkable. IMPRESSION: Moderately displaced intertrochanteric fracture of proximal left femur. Electronically Signed   By: Lupita Raider M.D.   On: 03/12/2023 19:36   DG Hip Unilat W or Wo Pelvis 2-3 Views Left  Result Date: 03/12/2023 CLINICAL DATA:  Fall, left hip pain EXAM: DG HIP (WITH OR WITHOUT PELVIS) 2-3V LEFT COMPARISON:  06/09/2007 FINDINGS: Acute intertrochanteric fracture of the proximal left femur with varus angulation. Hip joint alignment is maintained without dislocation. No additional fractures are seen. Bony pelvis intact. Severe atherosclerotic vascular calcifications. IMPRESSION: Acute intertrochanteric fracture of the proximal left femur. Electronically Signed   By: Duanne Guess D.O.   On: 03/12/2023 16:56   CT Head Wo Contrast  Result Date: 03/12/2023 CLINICAL DATA:  Head trauma, minor (Age >= 65y) EXAM: CT HEAD WITHOUT CONTRAST TECHNIQUE: Contiguous axial images were obtained from the base of the skull through the vertex without intravenous contrast. RADIATION DOSE REDUCTION: This exam was performed according to the departmental dose-optimization program which includes automated exposure control, adjustment of the mA and/or kV according to patient size and/or use of iterative reconstruction technique. COMPARISON:  Head CT 11/20/2022 FINDINGS: Brain: No hemorrhage. No hydrocephalus. No extra-axial fluid collection. Asymmetric hypodensity in the medial aspect of the right cerebellum and in the brachium pontis (series 3, image 9). No mass effect. No mass lesion. Mineralization of the basal ganglia bilaterally. Unchanged slightly hyperdense appearance of the falx Vascular: No hyperdense vessel or unexpected calcification. Skull: Normal. Negative for fracture or focal lesion. Sinuses/Orbits: No middle ear or mastoid effusion.  Paranasal sinuses are clear. Bilateral lens replacement. Orbits are otherwise unremarkable. Other: None. IMPRESSION: 1. Asymmetric hypodensity in the medial aspect of the right cerebellum and in the brachium pontis, which could represent an age-indeterminate infarct. Recommend brain MRI for further evaluation. 2. No acute intracranial hemorrhage. Electronically Signed   By: Lorenza Cambridge M.D.   On: 03/12/2023 16:36   DG Chest 1 View  Result Date: 03/12/2023 CLINICAL DATA:  Fall.  History of CHF. EXAM: CHEST  1 VIEW COMPARISON:  Chest radiograph dated 01/15/2023 FINDINGS: Stable cardiomegaly. Stable right CVC catheter tip terminates in the right atrium. Both lungs are clear. No pneumothorax or pleural effusion. Left axillary vascular stent. No acute osseous abnormality. IMPRESSION: Cardiomegaly.  No acute findings in the chest. Electronically Signed   By: Hart Robinsons M.D.   On: 03/12/2023 16:34     PHYSICAL EXAM  Temp:  [97.4 F (36.3 C)-99.7 F (37.6 C)] 97.5 F (36.4 C) (10/19 1100) Pulse Rate:  [37-133] 93 (10/19 1100) Resp:  [11-28] 18 (10/19 1100) BP: (108-193)/(50-123) 114/88 (10/19 1100) SpO2:  [76 %-100 %] 97 % (10/19 1100) Weight:  [52.9 kg] 52.9 kg (10/19 0713)  General - Well nourished, well developed pleasant elderly African-American lady, in no apparent distress.  Ophthalmologic -  fundi not visualized due to noncooperation.  Cardiovascular - irregularly irregular heart rate and rhythm.  Neuro - awake, alert, eyes open, oriented to age but not able to speak in. No aphasia, able to ask questions in short sentences, however, psychomotor slowing with intermittent perseveration, following all simple commands. Able to name 2/4 and repeat in mildly dysarthric voice. No gaze palsy, tracking bilaterally, visual field full. No facial droop. Tongue midline. Bilateral UEs 4/5, no drift. RLE 3/5 no drift, LLE not able to check due to hip fracture and subjective pain. Right first 3 digit  amputation and left anterior half foot amputation. Sensation symmetrical bilaterally subjectively, b/l FTN intact grossly, gait not tested.    ASSESSMENT/PLAN Robin [name] is a 72 y.o. female with history of CAD on plavix, DM, HTN, HLD, OSA, ESRD on HD, Afib on eliquis admitted for fall with left hip fracture. But also found to have right cerebellar peduncle infarct. No tPA given due to outside window.    Stroke:  right cerebellar peduncle infarct, likely secondary to small vessel disease source CT head concerning for right cerebellar infarct MRI  right cerebellar peduncle infarct MRA  Motion degraded study. Within this limitation, no proximal large vessel occlusion or high-grade stenosis. Carotid Doppler  pending 2D Echo  pending LDL 105 mg percent.  HgbA1c 7.7 SCDs for VTE prophylaxis clopidogrel 75 mg daily and Eliquis (apixaban) daily prior to admission, now on No antithrombotic given surgery later today and NPO status. Recommend to resume AC and plavix post surgery once hemodynamically stable and clinically appropriate.  Ongoing aggressive stroke risk factor management Therapy recommendations:  pending Disposition:  pending  Left hip fracture Suspect current stroke caused her fall which made left hip fracture Orthopedics on board, plan for surgery this afternoon Now NPO Recommend to resume AC and plavix post surgery once hemodynamically stable and clinically appropriate.  Chronic afib On home eliquis Now eliquis on hold given upcoming surgery Recommend to resume [facility] post op once hemodynamically stable May consider heparin IV bridge if needed.  Diabetes HgbA1c 7.7 goal < 7.0 Uncontrolled CBG monitoring SSI DM education and close PCP follow up  Hypertension Stable Avoid hypotension perioperatively Long term BP goal normotensive  Hyperlipidemia Home meds:  none  LDL pending, goal < 70 Now NPO for OR today Consider statin if needed  Other Stroke Risk  Factors Advanced age CAD on plavix  OSA  Other Active Problems ESRD On HD  Hospital day # 2 Patient had left hip surgery which has gone well.  She remains mostly nonverbal but stable.  Echocardiogram and carotid ultrasound are pending.  Resume anticoagulation if okay with orthopedic surgery.  Long discussion at bedside with patient and daughter and family and answered questions.  Greater than 50% time during this 35-minute visit, counseling and coordination of care and discussion patient and family and answering questions.   Robin Heady, Robin [name] Stroke Neurology 03/14/2023 2:43 PM    To contact Stroke Continuity provider, please refer to [email or web address]. After hours, contact General Neurology

## 2023-03-14 NOTE — Anesthesia Preprocedure Evaluation (Signed)
Anesthesia Evaluation  Patient identified by MRN, date of birth, ID band Patient awake    Reviewed: Allergy & Precautions, H&P , NPO status , Patient's Chart, lab work & pertinent test results  Airway Mallampati: II   Neck ROM: full    Dental   Pulmonary sleep apnea , Current Smoker and Patient abstained from smoking.   breath sounds clear to auscultation       Cardiovascular hypertension, + angina  + CAD, + Past MI, + Cardiac Stents and +CHF   Rhythm:regular Rate:Normal     Neuro/Psych  Headaches PSYCHIATRIC DISORDERS Anxiety Depression Bipolar Disorder   CVA    GI/Hepatic ,GERD  ,,  Endo/Other  diabetes, Type 2    Renal/GU ESRF and DialysisRenal disease     Musculoskeletal  (+) Arthritis ,    Abdominal   Peds  Hematology   Anesthesia Other Findings   Reproductive/Obstetrics                             Anesthesia Physical Anesthesia Plan  ASA: 4  Anesthesia Plan: General   Post-op Pain Management:    Induction: Intravenous  PONV Risk Score and Plan: 2 and Ondansetron, Dexamethasone and Treatment may vary due to age or medical condition  Airway Management Planned: Oral ETT  Additional Equipment:   Intra-op Plan:   Post-operative Plan: Extubation in OR  Informed Consent: I have reviewed the patients History and Physical, chart, labs and discussed the procedure including the risks, benefits and alternatives for the proposed anesthesia with the patient or authorized representative who has indicated his/her understanding and acceptance.     Dental advisory given  Plan Discussed with: CRNA, Anesthesiologist and Surgeon  Anesthesia Plan Comments:        Anesthesia Quick Evaluation

## 2023-03-14 NOTE — Progress Notes (Signed)
[facility] NEPHROLOGY PROGRESS NOTE  Assessment/ Plan: Pt is a 72 y.o. yo female  with ESRD on HD MWF at [name]. DM, HTN, CAD s/p PCI, HFrEF 45-50%, depression, schizoaffective d/o, Hx DVT and Hx GIB. Admitted with hip fracture s/p surgery. Dialysis Orders:  MWF  - [facility]  3.5hrs, BFR 300, DFR AF 1.5,  EDW 54.2kg, 2K/ 2Ca UFP 1  Access: AVF, TDC  Heparin none Mircera 30 mcg q4wks - last 10/7 Venofer 100mg  qHD x10 Hectorol IV qHD    # L hip Frx - 2/2 Fall at home. She underwent ORIF on 03/14/2023. Per Ortho.   # ESRD on HD, s/p HD yesterday with 2 L UF. Tolerated well, Plan for next HD On Monday.  # Stroke - MRI brain demonstrated a R brachium pontine stroke.  MRA with no acute findings. Neuro following.   # Hypertension/volume:  BP variable, volume ok. Permissive hypertension w/new stroke.UF as tolerated.   # Anemia of CKD: H b acceptable. ESA recently dosed.   # Secondary Hyperparathyroidism -  Ca in goal. Phos high, start sevelamer. Continue VDRA.  # Nutrition - NPO. Renal diet w/fluid restrictions when advanced.  Subjective:  Seen and examined at bedside. Came from OR, somewhat sluggish due to anesthesia. Multiple family members presented at the bedside.  Objective Vital signs in last 24 hours: Vitals:   03/14/23 1034 03/14/23 1046 03/14/23 1055 03/14/23 1100  BP: (!) 109/91 111/71  114/88  Pulse: 90 (!) 102 75 93  Resp: 16 (!) 22 18 18   Temp:   97.9 F (36.6 C) (!) 97.5 F (36.4 C)  TempSrc:    Axillary  SpO2: 93% 94% 94% 97%  Weight:      Height:       Weight change:   Intake/Output Summary (Last 24 hours) at 03/14/2023 1220 Last data filed at 03/14/2023 0929 Gross per 24 hour  Intake 700 ml  Output 53 ml  Net 647 ml       Labs: RENAL PANEL Recent Labs  Lab 03/12/23 1755 03/12/23 1813 03/13/23 1051 03/13/23 1238 03/13/23 1239 03/13/23 1300 03/14/23 0448  NA 140 137 137 140  --  140 135  K 4.0 3.8 6.1* 4.4  --  4.4 3.8  CL  100 101 98 97*  --  97* 88*  CO2 25  --   --  24  --  25 17*  GLUCOSE 157* 155* 96 96  --  112* 252*  BUN 15 15 33* 22  --  23 16  CREATININE 5.00* 5.50* 7.00* 6.37*  --  6.66* 4.20*  CALCIUM 9.6  --   --  9.9  --  9.5 10.0  MG  --   --   --   --   --   --  2.0  PHOS  --   --   --  6.8* 6.7* 6.7* 7.0*  ALBUMIN  --   --   --  3.1*  --  3.1*  --     Liver Function Tests: Recent Labs  Lab 03/13/23 1238 03/13/23 1300  ALBUMIN 3.1* 3.1*   No results for input(s): "LIPASE", "AMYLASE" in the last 168 hours. No results for input(s): "AMMONIA" in the last 168 hours. CBC: Recent Labs    10/17/22 1242 10/18/22 0200 03/12/23 1813 03/13/23 1051 03/13/23 1238 03/13/23 1300 03/14/23 0448  HGB  --    < > 10.9* 12.6 10.8* 10.4* 12.1  MCV  --    < >  --   --  94.3 97.0 95.1  VITAMINB12 1,885*  --   --   --   --   --   --    < > = values in this interval not displayed.    Cardiac Enzymes: No results for input(s): "CKTOTAL", "CKMB", "CKMBINDEX", "TROPONINI" in the last 168 hours. CBG: Recent Labs  Lab 03/13/23 1749 03/13/23 1946 03/14/23 0703 03/14/23 0948 03/14/23 1111  GLUCAP 93 135* 248* 200* 181*    Iron Studies: No results for input(s): "IRON", "TIBC", "TRANSFERRIN", "FERRITIN" in the last 72 hours. Studies/Results: DG C-Arm 1-60 Min-No Report  Result Date: 03/14/2023 Fluoroscopy was utilized by the requesting physician.  No radiographic interpretation.   DG C-Arm 1-60 Min-No Report  Result Date: 03/14/2023 Fluoroscopy was utilized by the requesting physician.  No radiographic interpretation.   MR ANGIO HEAD WO CONTRAST  Result Date: 03/13/2023 CLINICAL DATA:  Neuro deficit, acute, stroke suspected. EXAM: MRA HEAD WITHOUT CONTRAST TECHNIQUE: Angiographic images of the Circle of Willis were acquired using MRA technique without intravenous contrast. COMPARISON:  MRI brain 03/12/2023. FINDINGS: Anterior circulation: Motion degraded study. Within this limitation, no  proximal large vessel occlusion, aneurysm, or evidence of vascular malformation. Posterior circulation: Motion degraded study. Within this limitation, visualized portions of the vertebral arteries and basilar artery are patent without stenosis or aneurysm. The PICAs are patent proximally. The AICAs are poorly visualized but patent at the origins. The SCAs are patent proximally. PCAs are patent proximally without stenosis or evidence of aneurysm. Anatomic variants: None. Other: None. IMPRESSION: Motion degraded study. Within this limitation, no proximal large vessel occlusion or high-grade stenosis. Electronically Signed   By: Orvan Falconer M.D.   On: 03/13/2023 08:30   MR BRAIN WO CONTRAST  Result Date: 03/12/2023 CLINICAL DATA:  Stroke suspected, neuro deficit EXAM: MRI HEAD WITHOUT CONTRAST TECHNIQUE: Multiplanar, multiecho pulse sequences of the brain and surrounding structures were obtained without intravenous contrast. COMPARISON:  07/27/2020 MRI head, 03/12/2023 CT head FINDINGS: Evaluation is somewhat limited by motion artifact. Brain: Restricted diffusion with ADC correlate in the right brachium pontis, which is associated with increased T2 hyperintense signal, consistent with acute to early subacute infarct. No acute hemorrhage, mass, mass effect, or midline shift. No hydrocephalus or extra-axial collection. Normal pituitary and craniocervical junction. No hemosiderin deposition to suggest remote hemorrhage. Advanced cerebral atrophy for age. Ex vacuo dilatation of the ventricles. T2 hyperintense signal in the periventricular white matter, likely the sequela of chronic small vessel ischemic disease. Vascular: Normal arterial flow voids. Skull and upper cervical spine: Normal marrow signal. Sinuses/Orbits: Clear paranasal sinuses. No acute finding in the orbits. Status post bilateral lens replacements. Other: Trace fluid in the mastoid air cells. IMPRESSION: Acute to early subacute infarct in the  right brachium pontis. These results were called by telephone at the time of interpretation on 03/12/2023 at 8:37 pm to provider Alvino Blood , who verbally acknowledged these results. Electronically Signed   By: Wiliam Ke M.D.   On: 03/12/2023 20:37   DG FEMUR MIN 2 VIEWS LEFT  Result Date: 03/12/2023 CLINICAL DATA:  Left thigh pain after fall. EXAM: LEFT FEMUR 2 VIEWS COMPARISON:  None Available. FINDINGS: Moderately displaced intertrochanteric fracture is seen involving proximal left femur. Middle and distal portions left femur are unremarkable. IMPRESSION: Moderately displaced intertrochanteric fracture of proximal left femur. Electronically Signed   By: Lupita Raider M.D.   On: 03/12/2023 19:36   DG Hip Unilat W or Wo Pelvis 2-3 Views Left  Result Date: 03/12/2023 CLINICAL  DATA:  Fall, left hip pain EXAM: DG HIP (WITH OR WITHOUT PELVIS) 2-3V LEFT COMPARISON:  06/09/2007 FINDINGS: Acute intertrochanteric fracture of the proximal left femur with varus angulation. Hip joint alignment is maintained without dislocation. No additional fractures are seen. Bony pelvis intact. Severe atherosclerotic vascular calcifications. IMPRESSION: Acute intertrochanteric fracture of the proximal left femur. Electronically Signed   By: Duanne Guess D.O.   On: 03/12/2023 16:56   CT Head Wo Contrast  Result Date: 03/12/2023 CLINICAL DATA:  Head trauma, minor (Age >= 65y) EXAM: CT HEAD WITHOUT CONTRAST TECHNIQUE: Contiguous axial images were obtained from the base of the skull through the vertex without intravenous contrast. RADIATION DOSE REDUCTION: This exam was performed according to the departmental dose-optimization program which includes automated exposure control, adjustment of the mA and/or kV according to patient size and/or use of iterative reconstruction technique. COMPARISON:  Head CT 11/20/2022 FINDINGS: Brain: No hemorrhage. No hydrocephalus. No extra-axial fluid collection. Asymmetric  hypodensity in the medial aspect of the right cerebellum and in the brachium pontis (series 3, image 9). No mass effect. No mass lesion. Mineralization of the basal ganglia bilaterally. Unchanged slightly hyperdense appearance of the falx Vascular: No hyperdense vessel or unexpected calcification. Skull: Normal. Negative for fracture or focal lesion. Sinuses/Orbits: No middle ear or mastoid effusion. Paranasal sinuses are clear. Bilateral lens replacement. Orbits are otherwise unremarkable. Other: None. IMPRESSION: 1. Asymmetric hypodensity in the medial aspect of the right cerebellum and in the brachium pontis, which could represent an age-indeterminate infarct. Recommend brain MRI for further evaluation. 2. No acute intracranial hemorrhage. Electronically Signed   By: Lorenza Cambridge M.D.   On: 03/12/2023 16:36   DG Chest 1 View  Result Date: 03/12/2023 CLINICAL DATA:  Fall.  History of CHF. EXAM: CHEST  1 VIEW COMPARISON:  Chest radiograph dated 01/15/2023 FINDINGS: Stable cardiomegaly. Stable right CVC catheter tip terminates in the right atrium. Both lungs are clear. No pneumothorax or pleural effusion. Left axillary vascular stent. No acute osseous abnormality. IMPRESSION: Cardiomegaly.  No acute findings in the chest. Electronically Signed   By: Hart Robinsons M.D.   On: 03/12/2023 16:34    Medications: Infusions:   ceFAZolin (ANCEF) IV     tranexamic acid      Scheduled Medications:  atorvastatin  40 mg Oral Daily   Chlorhexidine Gluconate Cloth  6 each Topical Q0600   doxercalciferol  5 mcg Intravenous Q M,W,F-HD   insulin aspart  0-5 Units Subcutaneous QHS   insulin aspart  0-6 Units Subcutaneous TID WC   metoprolol tartrate  12.5 mg Oral Once   multivitamin  1 tablet Oral QHS   pantoprazole  40 mg Oral QHS   umeclidinium bromide  1 puff Inhalation Daily    have reviewed scheduled and prn medications.  Physical Exam: General:NAD, comfortable, somnolent. Heart:RRR, s1s2  nl Lungs:clear b/l, no crackle Abdomen:soft, Non-tender, non-distended Extremities:No edema Dialysis Access: TDC   Letta Cargile Prasad Verlan Grotz 03/14/2023,12:20 PM  LOS: 2 days

## 2023-03-14 NOTE — Discharge Instructions (Addendum)
Orthopedic Surgery Discharge Instructions  [name] Fracture: left pertrochanteric femur fracture Procedure Performed: left hip cephalomedullary nail Date of Surgery: 03/14/2023 Surgeon: Willia Craze, MD  Activity: You are allowed to put as much weight on your leg as you would like. You can walk as much as you would like. You can perform household activities such as cleaning dishes, doing laundry, vacuuming, etc.  Incision Care: Your incision site has a dressing over it. That dressing should remain in place and dry at all times for a total of one week after surgery. After one week, you can remove the dressing. Underneath the dressing, you will find skin staples. You should leave these staples in place. They will be taken out in the office when the wound has healed. Do not pick, rub, or scrub at them. Do not put cream or lotion over the surgical area. After one week and once the dressing is off, it is okay to let soap and water run over your incision. Again, do not pick, scrub, or rub at the staples when bathing. Do not submerge (e.g., take a bath, swim, go in a hot tub, etc.) until six weeks after surgery. There may be some bloody drainage from the incision into the dressing after surgery. This is normal. You do not need to replace the dressing. Continue to leave it in place for the one week as instructed above. Should the dressing become saturated with blood or drainage, please call the office for further instructions.   Medications: You have been prescribed oxycodone. This is a narcotic pain medication and should only be taken as prescribed. You should not drink alcohol or operate heavy machinery (including driving) while taking this medication. The oxycodone can cause constipation as a side effect. For that reason, you have been prescribed senna and miralax. These are both laxatives. You do not need to take this medication if you develop diarrhea. Should you remain constipated even  while taking the senna and miralax, please use the miralax twice daily. Tylenol has been prescribed to be taken every 8 hours, which will give you additional pain relief.   You may resume any home blood thinners (warfarin, lovenox, apixaban, plavix, xarelto, etc) after your surgery. Take these medications as they were previously prescribed.  You should not use over-the-counter NSAIDs (ibuprofen, Aleve, Celebrex, naproxen, meloxicam, etc.) for pain relief because there is some evidence that they decrease your body's ability to heal the fracture.   In order to set expectations for opioid prescriptions, you will only be prescribed opioids for a total of six weeks after surgery and, at two-weeks after surgery, your opioid prescription will start to tapered (decreased dosage and number of pills). If you have ongoing need for opioid medication six weeks after surgery, you will be referred to pain management. If you are already established with a provider that is giving you opioid medications, you should schedule an appointment with them for six weeks after surgery if you feel you are going to need another prescription. State law only allows for opioid prescriptions one week at a time. If you are running out of opioid medication near the end of the week, please call the office during business hours before running out so I can send you another prescription.    Diet: You are safe to resume your regular diet after surgery.   Reasons to Call the Office After Surgery: You should feel free to call the office with any concerns or questions you have in  the post-operative period, but you should definitely notify the office if you develop: -shortness of breath, chest pain, or trouble breathing -excessive bleeding, drainage, redness, or swelling around the surgical site -fevers, chills, or pain that is getting worse with each passing day -persistent nausea or vomiting -new weakness in the left leg, new or worsening  numbness or tingling in the left leg -other concerns about your surgery  Follow Up Appointments: You have a follow up appointment with Dr. Christell Constant on 04/02/2023. At that visit, x-rays will be obtained to check on your fracture healing and the skin staples will be removed. Please arrive on time to that appointment.   Office Information:  -Willia Craze, MD -Phone number: [phone] -Address: [address]       [place], [place] [address]

## 2023-03-14 NOTE — Op Note (Signed)
Orthopedic Surgery Operative Report   Procedure: Left intertrochanteric fracture intramedullary rodding   Modifier: none   Date of procedure: 03/15/2023   [name] MRN: [id number] DOB: 06-Dec-1950    Surgeon: Willia Craze, MD Assistant: None Pre-operative diagnosis: left pertrochanteric femur fracture Post-operative diagnosis: same as above Findings: left pertrochanteric femur fracture   Specimens: none Anesthesia: general EBL: 50cc Complications: none Pre-incision antibiotic: ancef TXA given prior to incision as well   Implants:  Implant Name Type Inv. Item Serial No. Manufacturer Lot No. LRB No. Used Action  NAIL Otis Peak [id number] - [id number] Nail NAIL [employer] [id number] Left 1 Implanted  SCREW LAG COMPR KIT 90/85 - [id number] Screw SCREW LAG COMPR KIT 90/85  SMITH [employer] [id number] Left 1 Implanted  SCREW TRIGEN LOW PROF 5.0X32.5 - [id number] Screw SCREW TRIGEN LOW PROF 5.0X32.5  [employer] [id number] Left 1 Implanted      Indication for procedure: Patient is a 72 year old female who presented to the ER after a fall. The patient had left hip pain and x-rays revealed a intertrochanteric femur fracture. The patient was admitted to a medicine service with orthopedics consulted. I met the patient and discussed the fracture. I recommended operative management in the form of intramedullary rodding to stabilize the fracture and allow for mobilization. Explained the risks of this procedure included, but were not limited to: nonunion, malunion, fixation failure, screw cut out, infection, bleeding, stiffness, need for additional procedures, deep vein thrombosis, pulmonary embolism, MI, arrhythmia, and death. The alternatives of this surgery would be to treat the fracture with immobilization in traction or to perform no intervention. After our discussion, patient and her daughter [name])  elected to proceed with surgery.    Procedure Description: The patient was met in the pre-operative holding area. The patient's identity and consent were verified. The operative site was marked by myself. The patient's and daughter's remaining questions about the surgery were answered. The patient was brought back to the operating room. General anesthesia was induced and an endotracheal tube was placed by the anesthesia staff. The patient was transferred to the [facility] table. All bony prominences were well padded. Traction was applied and reduction was attempted with manipulation. Fluoroscopy confirmed a satisfactory reduction. The surgical area was cleansed with a scrub brush and then alcohol. Ancef and TXA were administered by anesthesia. The patient's skin was then prepped and draped in a standard, sterile fashion. A time out was performed that identified the patient, the procedure, and the operative site. All team members agreed with what was stated in the time out.    An incision was made just proximal and inferior to the greater trochanter. The incision was taken sharply down through the fascia. A guide pin was inserted into the wound onto the top of the greater trochanter. Fluoroscopy was used to place the guide pin at the starting point at the tip of the greater trochanter and in line with the middle of the femoral neck. The wire was then advanced to a point just past the lesser trochanter. A soft tissue sleeve was advanced over the wire onto the greater trochanter. An entry reamer was used to open the proximal femoral canal under fluoroscopic guidance. The pin and reamer were removed. A long guide wire was placed down the femoral canal. A 10x18cm nail was advanced over the guidewire under fluoroscopic guidance. The guidewire was removed.    An incision was made  sharply through the skin, dermis, and fascia over the lateral thigh in the area where the lag screws would be inserted. The lag screw targeter  was placed through the jig onto the lateral femoral cortex. A guide wire was advanced through the lag screw targeter into the femoral head under fluoroscopic guidance. It was found to be in acceptable position on the AP and lateral views. The length of the lag screw was estimated off of the guide wire. A 90mm screw was selected. The inferior lag screw was drilled through the guide. The derotation device was placed through the targeter. The proximal lag screw hole was then drilled over the guide wire. The screw was inserted over the wire under fluoroscopic guidance. The derotation bar was removed and the inferior lag screw was inserted. AP and lateral fluoroscopic images confirmed satisfactory position of the screws in the femoral head.   An incision was made over the distal interlocking screw of the nail. Incision was taken sharply down through the skin, dermis, and fascia. A targeter was placed through the jig onto the lateral femoral cortex. A drill was used to drill the femur bicortically through the nail. The length of the screw was estimated off the drill. A 32.61mm screw was selected and inserted through the femur and distal hole of the nail. The jig was removed from the nail. Final AP and lateral fluoroscopic images confirmed satisfactory reduction and position of the fixation.    The wounds were copiously irrigated with sterile saline. The fascia was closed with 0 vicryl. The deep dermal layer was closed with 2-0 vicryl. The skin was closed with staples. Dressings were applied. All counts were correct at the end of the case. Patient was transferred back to a hospital bed. The patient was awakened from anesthesia and brought back to the post-anesthesia care unit in stable condition.     Post-operative plan: The patient will recover in the post-anesthesia care unit and then go to the floor on the medicine service. The patient will receive two post-operative doses of ancef. The patient will be weight  bearing as tolerated. The patient will work with physical therapy. The patient's disposition will be determined by the medicine service.       Willia Craze, MD Orthopedic Surgeon

## 2023-03-14 NOTE — Progress Notes (Signed)
Orthopedic Surgery Progress Note   Assessment: Patient is a 72 y.o. female with left pertrochanteric femur fracture s/p cephalomedullary rodding   Plan: -Operative plans: complete -Okay for diet from orthopedic perspective -Okay to resume home antiplatelets and anticoagulation post-operatively -Antibiotics: ancef x2 post-op doses -Weight bearing status: as tolerated -PT evaluate and treat -Pain control -Dispo: per primary  ___________________________________________________________________________  Subjective: No acute events since surgery. Recovering in PACU. Pain is under control.    Physical Exam:  General: no acute distress, appears stated age Neurologic: drowsy, following commands, answering yes/no questions Respiratory: unlabored breathing on nasal canula, symmetric chest rise  MSK:   -Left lower extremity  Dressings over hip c/d/i TA and GSC intact Partial amputation of toes Sensation intact to light touch over dorsal and planar aspect of foot Foot warm and well perfused   [name] Patient MRN: [id number] Date: 03/14/23

## 2023-03-14 NOTE — Anesthesia Postprocedure Evaluation (Signed)
Anesthesia Post Note  Patient: Robin [name]  Procedure(s) Performed: INTRAMEDULLARY (IM) NAIL INTERTROCHANTERIC (Left)     Patient location during evaluation: PACU Anesthesia Type: General Level of consciousness: awake and alert Pain management: pain level controlled Vital Signs Assessment: post-procedure vital signs reviewed and stable Respiratory status: spontaneous breathing, nonlabored ventilation, respiratory function stable and patient connected to nasal cannula oxygen Cardiovascular status: blood pressure returned to baseline and stable Postop Assessment: no apparent nausea or vomiting Anesthetic complications: no   No notable events documented.  Last Vitals:  Vitals:   03/14/23 1046 03/14/23 1055  BP: 111/71   Pulse: (!) 102 75  Resp: (!) 22 18  Temp:  36.6 C  SpO2: 94% 94%    Last Pain:  Vitals:   03/14/23 0926  TempSrc:   PainSc: Asleep                 Dorwin Fitzhenry S

## 2023-03-14 NOTE — Progress Notes (Signed)
Progress Note   Patient: Robin [name] YQM:[id number] DOB: Feb 16, 1951 DOA: 03/15/2023     2 DOS: the patient was seen and examined on 03-31-2023    Subjective:  Patient seen and examined at bedside this morning Denies nausea vomiting abdominal pain or chest pain Has some pain at the left hip area improving with pain medication    Brief hospital course: Robin [name] is a 72 y.o. female with medical history significant for ESRD on HD MWF, coronary artery disease status post PCI with stent placement March 2024, HFrEF 45 to 50% (07/29/2022) type 2 diabetes, hypertension, recently admitted by family medicine teaching service at [facility] for chest pain, who presents with a fall at home.  The patient reports she lost her balance in her kitchen and fell backwards landing on her left hip and hitting the back of her head on the hardwood floor.  No loss of consciousness.  EMS was activated.  Upon arrival to the emergency room MRI of the brain also showed acute to early subacute ischemic stroke and neurology is following.  X-ray of the left femur showed left intertrochanteric hip fracture. Neurologist and orthopedics on board  2023-03-31: Patient seen in the PACU.  No complaints.  Patient underwent intramedullary nail intertrochanteric (left) earlier today.  Patient remains stable postop.    Assessment and Plan: Close left hip fracture status post mechanical fall, POA Plan for orthopedic repair \\when  cleared by neurology N.p.o. after 9 AM As needed analgesics and as needed bowel regimen TOC consulted for DC planning PT OT with orthopedic surgery's guidance Currently no weightbearing in left lower extremity Fall precautions 03-31-23: Patient underwent surgery today.  Patient is stable postop.  Postop management as per orthopedic team.   Acute to early subacute infarct in the right brachium pontis, seen on brain MRI Neurology/stroke team on board we appreciate input Follow-up 2D echo with bubble  study MRA of the head did not show any significant vessel occlusion Permissive hypertension for the first 24 hours Treat SBP greater than 220 or DBP greater than 120 Monitor blood pressure Continue neurochecks LDH 105, A1c 7.7 Initiate high intensity statin therapy PT/OT/speech's evaluation Continue fall precautions Have discussed the case with the neurologist March 31, 2023: Neurology team is following.  Input is highly appreciated.   Type 2 diabetes with hyperglycemia Continue current insulin therapy Continue to optimize.   Coronary artery disease status post PCI with stenting Home Plavix and Eliquis held in anticipation for orthopedic surgery No reported anginal symptoms.   History of thromboembolism DVT, chronic thromboembolic pulmonary hypertension Home Eliquis held. Resume Eliquis when okay with orthopedic surgery.   ESRD on HD MWF Nephrologist on board for dialysis needs Monitor electrolytes closely   Bipolar disorder Continue home regimen.   Hypertension Holding antihypertensives at this time on account of permissive hypertension   HFrEF 45% Euvolemic on exam Holding antihypertensives at this time on account of permissive hypertension Strict I's and O's and daily       DVT prophylaxis: Home Eliquis held due to anticipated orthopedic surgery.  Restart home Eliquis when okay with orthopedic surgery.   Code Status: DNR   Family Communication: None at bedside   Disposition Plan: Admitted to telemetry surgical unit   Consults called: Orthopedic surgery, neurology/stroke team, nephrology.   Admission status: Inpatient status.     Status is: Inpatient.  Physical Exam: General condition: Patient is not in any distress.  Patient is awake and alert. HEENT: Patient is pale.  No jaundice. Neck: Supple.  Lungs: Clear to auscultation. CVS: S1-S2 with frequent ectopies. Abdomen: Soft and nontender. Neuro: Awake and alert. Extremities: No leg edema.  Data  Reviewed: I have reviewed patient's MRI of the brain with no significant stenosis, I have also reviewed patient's MRI of the brain showing acute to early subacute ischemic stroke, I have reviewed patient labs including CBC BMP as listed above I have reviewed orthopedic documentation as well as neurologist documentation  Vitals:   02/27/2023 1034 03/23/2023 1046 02/24/2023 1055 02/24/2023 1100  BP: (!) 109/91 111/71  114/88  Pulse: 90 (!) 102 75 93  Resp: 16 (!) 22 18 18   Temp:   97.9 F (36.6 C) (!) 97.5 F (36.4 C)  TempSrc:    Axillary  SpO2: 93% 94% 94% 97%  Weight:      Height:          Latest Ref Rng & Units 03/23/2023    4:48 AM 03/13/2023    1:00 PM 03/13/2023   12:38 PM  CBC  WBC 4.0 - 10.5 K/uL 12.4  9.4  9.1   Hemoglobin 12.0 - 15.0 g/dL 51.8  84.1  66.0   Hematocrit 36.0 - 46.0 % 37.0  32.2  33.2   Platelets 150 - 400 K/uL 185  180  189        Latest Ref Rng & Units 03/19/2023    4:48 AM 03/13/2023    1:00 PM 03/13/2023   12:38 PM  CMP  Glucose 70 - 99 mg/dL 630  160  96   BUN 8 - 23 mg/dL 16  23  22    Creatinine 0.44 - 1.00 mg/dL 1.09  3.23  5.57   Sodium 135 - 145 mmol/L 135  140  140   Potassium 3.5 - 5.1 mmol/L 3.8  4.4  4.4   Chloride 98 - 111 mmol/L 88  97  97   CO2 22 - 32 mmol/L 17  25  24    Calcium 8.9 - 10.3 mg/dL 32.2  9.5  9.9      Author: Barnetta Chapel, MD 03/04/2023 11:16 AM  For on call review [email or web address].

## 2023-03-14 NOTE — Progress Notes (Signed)
SLP Cancellation Note  Patient Details Name: Robin [name] MRN: [id number] DOB: Oct 20, 1950   Cancelled treatment:       Reason Eval/Treat Not Completed: Patient at procedure or test/unavailable (OR this am). Will f/u as able.    Mahala Menghini., M.A. CCC-SLP Acute Rehabilitation Services Office [phone]  Secure chat preferred  03/14/2023, 8:35 AM

## 2023-03-14 NOTE — H&P (Signed)
Orthopedic Surgery H&P Update  Patient's history and physical reviewed - no updates at this time Risks of surgery were covered again, patient and daughter elected to continue with planned procedure Written consent verified Site marked TXA and ancef on call to OR To OR when ready  Willia Craze, MD Orthopedic Surgeon

## 2023-03-14 NOTE — Anesthesia Procedure Notes (Addendum)
Procedure Name: Intubation Date/Time: 03/14/2023 7:44 AM  Performed by: Einar Grad, CRNAPre-anesthesia Checklist: Patient identified, Emergency Drugs available, Suction available, Patient being monitored and Timeout performed Patient Re-evaluated:Patient Re-evaluated prior to induction Oxygen Delivery Method: Circle system utilized Preoxygenation: Pre-oxygenation with 100% oxygen Induction Type: IV induction Ventilation: Mask ventilation without difficulty Laryngoscope Size: Mac and 3 Grade View: Grade I Tube type: Oral Tube size: 7.0 mm Number of attempts: 1 Placement Confirmation: ETT inserted through vocal cords under direct vision, positive ETCO2 and CO2 detector Secured at: 22 cm Tube secured with: Tape

## 2023-03-14 NOTE — Progress Notes (Signed)
PT Cancellation Note  Patient Details Name: Robin [name] MRN: [id number] DOB: [birth year]   Cancelled Treatment:    Reason Eval/Treat Not Completed: Patient at procedure or test/unavailable (pt off unit to OR this AM. Will follow up at later date/time as schedule allows and pt able.)   Renaldo Fiddler PT, DPT Acute Rehabilitation Services Office [phone]  03/14/23 10:19 AM

## 2023-03-15 ENCOUNTER — Inpatient Hospital Stay (HOSPITAL_COMMUNITY): Payer: No Typology Code available for payment source

## 2023-03-15 DIAGNOSIS — R569 Unspecified convulsions: Secondary | ICD-10-CM | POA: Diagnosis not present

## 2023-03-15 DIAGNOSIS — S72142A Displaced intertrochanteric fracture of left femur, initial encounter for closed fracture: Secondary | ICD-10-CM | POA: Diagnosis not present

## 2023-03-15 DIAGNOSIS — F17210 Nicotine dependence, cigarettes, uncomplicated: Secondary | ICD-10-CM

## 2023-03-15 DIAGNOSIS — I639 Cerebral infarction, unspecified: Secondary | ICD-10-CM

## 2023-03-15 DIAGNOSIS — I6389 Other cerebral infarction: Secondary | ICD-10-CM

## 2023-03-15 DIAGNOSIS — I4891 Unspecified atrial fibrillation: Secondary | ICD-10-CM | POA: Diagnosis not present

## 2023-03-15 LAB — RENAL FUNCTION PANEL
Albumin: 3 g/dL — ABNORMAL LOW (ref 3.5–5.0)
Anion gap: 24 — ABNORMAL HIGH (ref 5–15)
BUN: 36 mg/dL — ABNORMAL HIGH (ref 8–23)
CO2: 19 mmol/L — ABNORMAL LOW (ref 22–32)
Calcium: 9.7 mg/dL (ref 8.9–10.3)
Chloride: 90 mmol/L — ABNORMAL LOW (ref 98–111)
Creatinine, Ser: 6.17 mg/dL — ABNORMAL HIGH (ref 0.44–1.00)
GFR, Estimated: 7 mL/min — ABNORMAL LOW (ref >60)
Glucose, Bld: 226 mg/dL — ABNORMAL HIGH (ref 70–99)
Phosphorus: 7.5 mg/dL — ABNORMAL HIGH (ref 2.5–4.6)
Potassium: 5.1 mmol/L (ref 3.5–5.1)
Sodium: 133 mmol/L — ABNORMAL LOW (ref 135–145)

## 2023-03-15 LAB — GLUCOSE, CAPILLARY
Glucose-Capillary: 215 mg/dL — ABNORMAL HIGH (ref 70–99)
Glucose-Capillary: 211 mg/dL — ABNORMAL HIGH (ref 70–99)
Glucose-Capillary: 204 mg/dL — ABNORMAL HIGH (ref 70–99)
Glucose-Capillary: 137 mg/dL — ABNORMAL HIGH (ref 70–99)

## 2023-03-15 LAB — CBC
HCT: 31.7 % — ABNORMAL LOW (ref 36.0–46.0)
Hemoglobin: 10.1 g/dL — ABNORMAL LOW (ref 12.0–15.0)
MCH: 30.5 pg (ref 26.0–34.0)
MCHC: 31.9 g/dL (ref 30.0–36.0)
MCV: 95.8 fL (ref 80.0–100.0)
Platelets: 154 10*3/uL (ref 150–400)
RBC: 3.31 MIL/uL — ABNORMAL LOW (ref 3.87–5.11)
RDW: 17.9 % — ABNORMAL HIGH (ref 11.5–15.5)
WBC: 12.1 10*3/uL — ABNORMAL HIGH (ref 4.0–10.5)
nRBC: 0 % (ref 0.0–0.2)

## 2023-03-15 LAB — LACTIC ACID, PLASMA: Lactic Acid, Venous: 1.5 mmol/L (ref 0.5–1.9)

## 2023-03-15 LAB — ECHOCARDIOGRAM COMPLETE BUBBLE STUDY
Area-P 1/2: 3.65 cm2
S' Lateral: 3.7 cm

## 2023-03-15 LAB — VALPROIC ACID LEVEL: Valproic Acid Lvl: 54 ug/mL (ref 50.0–100.0)

## 2023-03-15 MED ORDER — SODIUM CHLORIDE 0.9 % IV SOLN
2000.0000 mg | Freq: Once | INTRAVENOUS | Status: AC
  Administered 2023-03-15: 2000 mg via INTRAVENOUS
  Filled 2023-03-15: qty 20

## 2023-03-15 MED ORDER — APIXABAN 5 MG PO TABS: 5.0000 mg | ORAL_TABLET | Freq: Two times a day (BID) | ORAL | Status: DC

## 2023-03-15 MED ORDER — SEVELAMER CARBONATE 800 MG PO TABS: 1600.0000 mg | ORAL_TABLET | Freq: Three times a day (TID) | ORAL | Status: DC

## 2023-03-15 MED ORDER — CHLORHEXIDINE GLUCONATE CLOTH 2 % EX PADS: 6.0000 | MEDICATED_PAD | Freq: Every day | CUTANEOUS | Status: DC

## 2023-03-15 MED ORDER — VALPROATE SODIUM 100 MG/ML IV SOLN
1000.0000 mg | Freq: Three times a day (TID) | INTRAVENOUS | Status: DC
  Filled 2023-03-15 (×3): qty 10

## 2023-03-15 MED ORDER — VALPROATE SODIUM 100 MG/ML IV SOLN
1000.0000 mg | Freq: Four times a day (QID) | INTRAVENOUS | Status: DC
  Administered 2023-03-15 – 2023-03-17 (×6): 1000 mg via INTRAVENOUS
  Filled 2023-03-15 (×8): qty 10

## 2023-03-15 MED ORDER — METOPROLOL TARTRATE 5 MG/5ML IV SOLN
5.0000 mg | Freq: Once | INTRAVENOUS | Status: AC
  Administered 2023-03-15: 5 mg via INTRAVENOUS
  Filled 2023-03-15: qty 5

## 2023-03-15 MED ORDER — LORAZEPAM 2 MG/ML IJ SOLN
INTRAMUSCULAR | Status: AC
  Filled 2023-03-15: qty 1

## 2023-03-15 MED ORDER — METOPROLOL TARTRATE 5 MG/5ML IV SOLN
2.5000 mg | Freq: Four times a day (QID) | INTRAVENOUS | Status: DC
  Administered 2023-03-15 – 2023-03-18 (×10): 2.5 mg via INTRAVENOUS
  Filled 2023-03-15 (×9): qty 5

## 2023-03-15 MED ORDER — LORAZEPAM 2 MG/ML IJ SOLN
1.0000 mg | INTRAMUSCULAR | Status: AC
  Administered 2023-03-15: 1 mg via INTRAVENOUS

## 2023-03-15 MED ORDER — CLOPIDOGREL BISULFATE 75 MG PO TABS: 75.0000 mg | ORAL_TABLET | Freq: Every day | ORAL | Status: DC

## 2023-03-15 MED ORDER — METOPROLOL TARTRATE 25 MG PO TABS: 25.0000 mg | ORAL_TABLET | Freq: Two times a day (BID) | ORAL | Status: DC

## 2023-03-15 MED ORDER — SODIUM CHLORIDE 0.9 % IV SOLN: 10.0000 mg/kg | Freq: Once | INTRAVENOUS | Status: DC

## 2023-03-15 MED ORDER — SODIUM CHLORIDE 0.9 % IV SOLN
200.0000 mg | Freq: Once | INTRAVENOUS | Status: AC
  Administered 2023-03-15: 200 mg via INTRAVENOUS
  Filled 2023-03-15: qty 20

## 2023-03-15 MED ORDER — SODIUM CHLORIDE 0.9 % IV SOLN
100.0000 mg | Freq: Two times a day (BID) | INTRAVENOUS | Status: DC
  Administered 2023-03-16: 100 mg via INTRAVENOUS
  Filled 2023-03-15 (×2): qty 10

## 2023-03-15 MED ORDER — VALPROATE SODIUM 100 MG/ML IV SOLN
1000.0000 mg | Freq: Once | INTRAVENOUS | Status: AC
  Administered 2023-03-15: 1000 mg via INTRAVENOUS
  Filled 2023-03-15: qty 10

## 2023-03-15 MED ORDER — METOPROLOL TARTRATE 12.5 MG HALF TABLET: 12.5000 mg | ORAL_TABLET | Freq: Two times a day (BID) | ORAL | Status: DC

## 2023-03-15 NOTE — Progress Notes (Signed)
Patient having jerking/tremors of bilateral upper extremities, with fists closed. Also experiencing facial twitching. Patient alert and able to answer questions with one word responses at times. Following commands, such as "smile" and "put your arms out." HR elevated during these episodes (140's). MD Dartha Lodge and MD Pearlean Brownie made aware. Will see during rounds.

## 2023-03-15 NOTE — Consult Note (Addendum)
Cardiology Consultation   Patient ID: [name] MRN: [id number]; DOB: 12-13-1950  Admit date: 02/28/2023 Date of Consult: 03/15/2023  PCP:  [facility]   [facility] Providers Cardiologist:  Nicki Guadalajara, MD        Patient Profile:   Robin [name] is a 72 y.o. female with a hx of ESRD on dialysis, paroxysmal A-fib, remote PE/DVT on Eliquis, CAD s/p PCI to proximal circumflex in 2016 with recent NSTEMI with moderate nonobstructive disease 07/31/22, lower extremity necrotizing infctions s/p amputations, combined chronic systolic and diastolic heart failure, dementia, bipolar, COPD, T2DM, PVD s/p LLE SFA stent 2023, prolonged QT interval, HTN, and HLD.   She is being seen 03/15/2023 for the evaluation of rapid atrial fib at the request of Dr Dartha Lodge.  History of Present Illness:   Robin [name] was admitted 08/22-08/25 with chest pain, N&V.  Troponins were mildly elevated in the 60s, but flat.  She was seen by cardiology and no further workup was indicated.  Better blood pressure control was recommended.  Follow-up as an outpatient.  Patient was admitted 10/18 after losing her balance in the kitchen, falling back and hitting her head on a hardwood floor.  No loss of consciousness.  She had a hip fracture that was surgically repaired on 10/19.  She also had dialysis yesterday.  Plavix and Eliquis was held for her surgery.  On a noncontrast brain MRI she was noted to have an ischemic stroke, Neurology was called.  Early this a.m., she had intermittent facial and bilateral upper extremity tremors.  They would stop when she concentrated her focused.  Later, the jerking tremors and twitching returned and worsened.  She was still able to respond to simple commands.  Her heart rate was elevated, in the 140s.  An EEG done today showed evidence of epileptogenicity with generalized onset. Due to the frequency of epileptform discharges, there is increased risk of seizure  recurrence. Additionally there is moderate/severe diffuse encephalopathy.  She is currently undergoing a long-term EEG.  Nephrology was called to manage her dialysis, normally Monday Wednesday and Friday.  She had dialysis Saturday, and is to get it again on Monday.  They increased her Renvela dose for hyperphosphatemia.  During these episodes, her ECGs showed a much more rapid rhythm, irregular.  The fastest ECG showed a heart rate of 157 and was read as rapid atrial fibrillation.  Other ECGs that were not quite as fast appear to be sinus tachycardia.  Cardiology was asked to evaluate her.  Robin [name] was evaluated with her son in the room and he was able to provide some information.  He said she was fine up until she fell and since then has gotten worse and worse.  According to nursing staff, her level of consciousness has decreased over the course of the day.  Currently, she can answer yes or no to simple questions but is not really able to provide any information.   Past Medical History:  Diagnosis Date   Acute delirium 11/18/2014   Acute encephalopathy    Acute on chronic respiratory failure with hypoxia (HCC) 09/09/2016   Acute respiratory failure with hypoxia (HCC) 11/29/2015   Anemia in chronic kidney disease 12/09/2014   Anxiety    Arthritis    Benign hypertension    Bipolar affective disorder (HCC) 12/09/2014   CAD (coronary artery disease), native coronary artery with 2 stents  11/17/2014   Charcot foot due to diabetes mellitus (HCC)  Chronic combined systolic and diastolic CHF (congestive heart failure) (HCC) 11/18/2014   Closed left ankle fracture 11/17/2014   Confusion 01/21/2015   Depression    Diabetes mellitus without complication (HCC)    End stage renal disease on dialysis [facility])    Fracture dislocation of ankle 11/17/2014   GERD (gastroesophageal reflux disease)    Heart murmur    History of blood transfusion    History of bronchitis    History of pneumonia     Hyperlipidemia 03/26/2015   Hypertension associated with diabetes (HCC) 11/18/2014   Hypertensive heart/renal disease with failure (HCC) 12/09/2014   Hypokalemia 11/17/2014   Multiple falls 01/21/2015   Nausea & vomiting 01/15/2023   Obesity 11/17/2014   Onychomycosis 10/07/2016   Right leg DVT (HCC) 12/05/2015   SIRS (systemic inflammatory response syndrome) (HCC) 08/08/2016   Sleep apnea    Unstable angina (HCC) 12/26/2017   Ventricular tachycardia (HCC)     Past Surgical History:  Procedure Laterality Date   A/V FISTULAGRAM N/A 01/03/2020   Procedure: A/V FISTULAGRAM;  Surgeon: Nada Libman, MD;  [facility]: [facility];  Service: Cardiovascular;  Laterality: N/A;   A/V FISTULAGRAM Left 03/12/2021   Procedure: A/V FISTULAGRAM;  Surgeon: Nada Libman, MD;  Location: [facility];  Service: Cardiovascular;  Laterality: Left;   ABDOMINAL HYSTERECTOMY     ANGIOPLASTY Left 01/26/2020   Procedure: LEFT ARM GRAPH THROMBECTOMY WITH BALLON ANGIOPLASTY;  Surgeon: Maeola Harman, MD;  Location: [facility] OR;  Service: Vascular;  Laterality: Left;   ANKLE CLOSED REDUCTION N/A 11/17/2014   Procedure: CLOSED REDUCTION ANKLE;  Surgeon: Frederico Hamman, MD;  Location: [facility] OR;  Service: Orthopedics;  Laterality: N/A;   ANKLE FUSION Left 03/16/2015   Procedure: Left Tibiocalcaneal Fusion;  Surgeon: Nadara Mustard, MD;  Location: [facility] OR;  Service: Orthopedics;  Laterality: Left;   APPLICATION OF WOUND VAC Right 08/06/2016   Procedure: APPLICATION OF PREVENA WOUND VAC;  Surgeon: Nadara Mustard, MD;  Location: [facility] OR;  Service: Orthopedics;  Laterality: Right;   AV FISTULA PLACEMENT Left may-2016   done at [facility]   CARDIAC CATHETERIZATION     2 stent    CHOLECYSTECTOMY     CORONARY PRESSURE/FFR STUDY N/A 07/31/2022   Procedure: INTRAVASCULAR PRESSURE WIRE/FFR STUDY;  Surgeon: Orbie Pyo, MD;  [facility]: [facility];  Service: Cardiovascular;  Laterality: N/A;   CORONARY STENT  PLACEMENT     FISTULOGRAM Left 01/26/2020   Procedure: FISTULOGRAM;  Surgeon: Maeola Harman, MD;  Location: [name] OR;  Service: Vascular;  Laterality: Left;   FOOT ARTHRODESIS Right 08/06/2016   Procedure: Right Foot Fusion Lisfranc Joint;  Surgeon: Nadara Mustard, MD;  Location: [facility] OR;  Service: Orthopedics;  Laterality: Right;   HARDWARE REMOVAL Left 03/16/2015   Procedure: Removal Hardware Left Ankle;  Surgeon: Nadara Mustard, MD;  Location: [name] OR;  Service: Orthopedics;  Laterality: Left;   IR AV DIALY SHUNT INTRO NEEDLE/INTRACATH INITIAL W/PTA/IMG LEFT  01/05/2018   IR AV DIALY SHUNT INTRO NEEDLE/INTRACATH INITIAL W/PTA/IMG LEFT  10/18/2022   IR FLUORO GUIDE CV LINE RIGHT  10/18/2022   IR US GUIDE VASC ACCESS LEFT  01/05/2018   IR US GUIDE VASC ACCESS RIGHT  10/18/2022   LEFT HEART CATH AND CORONARY ANGIOGRAPHY N/A 12/28/2017   Procedure: LEFT HEART CATH AND CORONARY ANGIOGRAPHY;  Surgeon: Kathleene Hazel, MD;  [facility];  Service: Cardiovascular;  Laterality: N/A;  LEFT HEART CATH AND CORONARY ANGIOGRAPHY N/A 07/31/2022   Procedure: LEFT HEART CATH AND CORONARY ANGIOGRAPHY;  Surgeon: Orbie Pyo, MD;  [facility];  Service: Cardiovascular;  Laterality: N/A;   LOWER EXTREMITY ANGIOGRAPHY N/A 03/21/2021   Procedure: LOWER EXTREMITY ANGIOGRAPHY;  Surgeon: Cephus Shelling, MD;  [facility];  Service: Cardiovascular;  Laterality: N/A;   ORIF ANKLE FRACTURE Left 11/20/2014   Procedure: OPEN REDUCTION INTERNAL FIXATION (ORIF) ANKLE FRACTURE;  Surgeon: Sheral Apley, MD;  Location: [facility];  Service: Orthopedics;  Laterality: Left;   PERIPHERAL VASCULAR BALLOON ANGIOPLASTY  01/03/2020   Procedure: PERIPHERAL VASCULAR BALLOON ANGIOPLASTY;  Surgeon: Nada Libman, MD;  [facility];  Service: Cardiovascular;;   PERIPHERAL VASCULAR BALLOON ANGIOPLASTY Left 03/12/2021   Procedure: PERIPHERAL VASCULAR BALLOON  ANGIOPLASTY;  Surgeon: Nada Libman, MD;  [facility];  Service: Cardiovascular;  Laterality: Left;   PERIPHERAL VASCULAR BALLOON ANGIOPLASTY  08/22/2021   Procedure: PERIPHERAL VASCULAR BALLOON ANGIOPLASTY;  Surgeon: Cephus Shelling, MD;  [facility];  Service: Cardiovascular;;  Left AVF PTA   TONSILLECTOMY       Home Medications:  Prior to Admission medications   Medication Sig Start Date End Date Taking? Authorizing Provider  acetaminophen (TYLENOL) 325 MG tablet Take 325 mg by mouth 2 (two) times daily.   Yes [provider]  albuterol (VENTOLIN HFA) 108 (90 Base) MCG/ACT inhaler Inhale 1-2 puffs into the lungs every 6 (six) hours as needed for wheezing or shortness of breath.   Yes [provider]  amLODipine (NORVASC) 5 MG tablet Take 1 tablet (5 mg total) by mouth daily. 01/19/23  Yes Lockie Mola, MD  apixaban (ELIQUIS) 5 MG TABS tablet Take 2.5 mg by mouth 2 (two) times daily with a meal.   Yes [provider]  ARIPiprazole (ABILIFY) 30 MG tablet Take 10 mg by mouth at bedtime. 10/30/22  Yes [provider]  clopidogrel (PLAVIX) 75 MG tablet Take 1 tablet (75 mg total) by mouth daily. 08/06/22  Yes Rana Snare, DO  diclofenac Sodium (VOLTAREN) 1 % GEL Apply 4 g topically 4 (four) times daily as needed (for back pain). 08/05/22  Yes Rana Snare, DO  Doxercalciferol (HECTOROL IV) Inject 1 Dose into the vein See admin instructions. [facility], Every Treatment 01/21/23 01/20/24 Yes [provider]  isosorbide mononitrate (IMDUR) 30 MG 24 hr tablet Take 1 tablet (30 mg total) by mouth daily with breakfast. 08/06/22  Yes Rana Snare, DO  lidocaine (LIDODERM) 5 % Place 1 patch onto the skin every 12 (twelve) hours as needed (pain). 09/02/22  Yes [provider]  lisinopril (ZESTRIL) 5 MG tablet Take 2.5 mg by mouth daily. 09/02/22  Yes [provider]  Melatonin 10 MG TABS Take 10 mg by  mouth at bedtime.   Yes [provider]  Methoxy PEG-Epoetin Beta (MIRCERA IJ) Inject 1 Dose as directed every 30 (thirty) days. 02/02/23 02/01/24 Yes [provider]  metoprolol succinate (TOPROL-XL) 25 MG 24 hr tablet Take 1 tablet (25 mg total) by mouth daily. Patient taking differently: Take 12.5 mg by mouth daily. 08/06/22  Yes Rana Snare, DO  multivitamin (RENA-VIT) TABS tablet Take 1 tablet by mouth at bedtime.  12/11/17  Yes [provider]  nitroGLYCERIN (NITROSTAT) 0.4 MG SL tablet Place 0.4 mg under the tongue every 5 (five) minutes as needed for chest pain.   Yes [provider]  pantoprazole (PROTONIX) 40 MG tablet Take 40 mg by mouth at bedtime.   Yes [provider]  Tiotropium Bromide Monohydrate (SPIRIVA RESPIMAT) 1.25 MCG/ACT AERS Inhale 2 puffs into the lungs every morning.   Yes [provider]    Inpatient Medications: Scheduled Meds:  apixaban  5 mg Oral BID   atorvastatin  40 mg Oral Daily   Chlorhexidine Gluconate Cloth  6 each Topical Q0600   clopidogrel  75 mg Oral Daily   doxercalciferol  5 mcg Intravenous Q M,W,F-HD   insulin aspart  0-5 Units Subcutaneous QHS   insulin aspart  0-6 Units Subcutaneous TID WC   metoprolol tartrate  12.5 mg Oral BID   multivitamin  1 tablet Oral QHS   pantoprazole  40 mg Oral QHS   sevelamer carbonate  1,600 mg Oral TID WC   umeclidinium bromide  1 puff Inhalation Daily   Continuous Infusions:  tranexamic acid     PRN Meds: acetaminophen, albuterol, labetalol, melatonin, oxyCODONE, polyethylene glycol, prochlorperazine  Allergies:   No Known Allergies  Social History:   Social History   Socioeconomic History   Marital status: Divorced    Spouse name: Not on file   Number of children: Not on file   Years of education: Not on file   Highest education level: Not on file  Occupational History   Not on file  Tobacco Use   Smoking status: Every Day    Current  packs/day: 1.00    Types: Cigarettes   Smokeless tobacco: Never  Vaping Use   Vaping status: Never Used  Substance and Sexual Activity   Alcohol use: No   Drug use: No   Sexual activity: Never  Other Topics Concern   Not on file  Social History Narrative   Not on file   Social Determinants of Health   Financial Resource Strain: Not on file  Food Insecurity: No Food Insecurity (01/15/2023)   Hunger Vital Sign    Worried About Running Out of Food in the Last Year: Never true    Ran Out of Food in the Last Year: Never true  Transportation Needs: No Transportation Needs (01/15/2023)   PRAPARE - Transportation    Lack of Transportation (Medical): No    Lack of Transportation (Non-Medical): No  Physical Activity: Not on file  Stress: Not on file  Social Connections: Not on file  Intimate Partner Violence: Not At Risk (01/15/2023)   Humiliation, Afraid, Rape, and Kick questionnaire    Fear of Current or Ex-Partner: No    Emotionally Abused: No    Physically Abused: No    Sexually Abused: No    Family History:   Family History  Family history unknown: Yes     ROS:  Please see the history of present illness.  All other ROS reviewed and negative.     Physical Exam/Data:   Vitals:   03/15/23 0831 03/15/23 1107 03/15/23 1110 03/15/23 1405  BP: (!) 153/96 132/73 (!) 151/67   Pulse: 98     Resp: 20 (!) 24 (!) 22   Temp: 98.8 F (37.1 C)   98.2 F (36.8 C)  TempSrc: Oral   Oral  SpO2:  100% 100%   Weight:      Height:       No intake or output data in the 24 hours ending 03/15/23 1451    02/28/2023    7:13 AM 03/13/2023    5:31 PM 03/13/2023    1:30 PM  Last 3 Weights  Weight (lbs) 116 lb 10 oz 116 lb 10 oz 120 lb 9.5 oz  Weight (kg) 52.9 kg 52.9 kg 54.7 kg     Body mass index is 20.01 kg/m.  General: Frail chronically ill-appearing female who complains only of being cold. HEENT: normal for age with very poor dentition Neck: JVD seen, cannot accurately assess  due to patient not being at 30 degrees. Vascular: No carotid bruits; Distal pulses 1-2+ bilaterally Cardiac:  normal S1, S2; irregular rate and rhythm; no murmur  Lungs: Rales bases bilaterally, no wheezing, rhonchi  Abd: soft, nontender, no hepatomegaly  Ext: no edema Musculoskeletal: No new deformities, BUE and BLE strength weak but equal Skin: warm and dry  Neuro: Patient is not able to cooperate fully with exam Psych: Not tested  EKG:  The EKG was personally reviewed and demonstrates: Atrial fibs versus sinus tachycardia with frequent ectopy, heart rate 157.  Earlier ECGs show sinus rhythm, sinus tach with frequent ectopy Telemetry:  Telemetry was personally reviewed and demonstrates: Sinus rhythm, sinus tach with frequent PACs and PVCs  Relevant CV Studies:  ECHO: Ordered  CARDIAC CATH: 07/31/2022   Ost LAD to Mid LAD lesion is 60% stenosed.   Ost Cx to Prox Cx lesion is 65% stenosed.   Prox Cx to Mid Cx lesion is 20% stenosed.   Prox RCA lesion is 20% stenosed.   Ost Ramus to Ramus lesion is 20% stenosed.   Ost 1st Diag lesion is 50% stenosed.   Ost 2nd Diag lesion is 70% stenosed.   Mid RCA lesion is 60% stenosed.   Mid LAD lesion is 40% stenosed.   1.  Patent ramus stent with minimal in-stent restenosis. 2.  Moderate proximal to mid LAD lesion with an RFR of 0.95. 3.  Moderate ostial left circumflex lesion with an RFR of 1.0. 4.  Moderate mid right coronary artery lesion with an RFR of 0.92. 5.  LVEDP of 6 mmHg. Diagnostic Dominance: Co-dominant   ECHO: 07/29/2022 1. Left ventricular ejection fraction, by estimation, is 45 to 50%. The  left ventricle has mildly decreased function. The left ventricle  demonstrates global hypokinesis. There is mild concentric left ventricular  hypertrophy. Left ventricular diastolic parameters are consistent with Grade II diastolic dysfunction (pseudonormalization).   2. Right ventricular systolic function is mildly reduced. The right   ventricular size is mildly enlarged. There is mildly elevated pulmonary  artery systolic pressure. The estimated right ventricular systolic  pressure is 41.0 mmHg.   3. The mitral valve is grossly normal. Trivial mitral valve  regurgitation. No evidence of mitral stenosis.   4. The aortic valve is tricuspid. Aortic valve regurgitation is not  visualized. Aortic valve sclerosis is present, with no evidence of aortic  valve stenosis.   5. The inferior vena cava is dilated in size with <50% respiratory  variability, suggesting right atrial pressure of 15 mmHg.   Laboratory Data:  High Sensitivity Troponin:  No results for input(s): "TROPONINIHS" in the last 720 hours.   Chemistry Recent Labs  Lab 03/13/23 1300 03/22/2023 0448 03/15/23 0930  NA 140 135 133*  K 4.4 3.8 5.1  CL 97* 88* 90*  CO2 25 17* 19*  GLUCOSE 112* 252* 226*  BUN 23 16 36*  CREATININE 6.66* 4.20* 6.17*  CALCIUM 9.5 10.0 9.7  MG  --  2.0  --   GFRNONAA 6* 11* 7*  ANIONGAP 18* 30* 24*    Recent Labs  Lab 03/13/23 1238 03/13/23  1300 03/15/23 0930  ALBUMIN 3.1* 3.1* 3.0*   Lipids  Recent Labs  Lab 03/13/23 1238  CHOL 189  TRIG 74  HDL 69  LDLCALC 105*  CHOLHDL 2.7    Hematology Recent Labs  Lab 03/13/23 1300 04/13/23 0448 03/15/23 0930  WBC 9.4 12.4* 12.1*  RBC 3.32* 3.89 3.31*  HGB 10.4* 12.1 10.1*  HCT 32.2* 37.0 31.7*  MCV 97.0 95.1 95.8  MCH 31.3 31.1 30.5  MCHC 32.3 32.7 31.9  RDW 18.3* 18.2* 17.9*  PLT 180 185 154   Thyroid No results for input(s): "TSH", "FREET4" in the last 168 hours.  BNPNo results for input(s): "BNP", "PROBNP" in the last 168 hours.  DDimer No results for input(s): "DDIMER" in the last 168 hours.   Radiology/Studies:  EEG adult  Result Date: 03/15/2023 [name], MD     03/15/2023  2:27 PM Patient Name: LEONIA ARMAN MRN: [id number] Epilepsy Attending: [name] Referring Physician/Provider: Elmer Picker, NP Date: 03/15/2023 Duration: 22.36  mins Patient history: 72yo F with seizure like activity getting eeg to evaluate for seizure Level of alertness: Awake/ lethargic AEDs during EEG study: Ativan Technical aspects: This EEG study was done with scalp electrodes positioned according to the 10-20 International system of electrode placement. Electrical activity was reviewed with band pass filter of 1-70Hz , sensitivity of 7 uV/mm, display speed of 66mm/sec with a 60Hz  notched filter applied as appropriate. EEG data were recorded continuously and digitally stored.  Video monitoring was available and reviewed as appropriate. Description: EEG showed continuous generalized  3 to 6 Hz theta-delta slowing. Generalized polyspikes and fast activity lasting 1-2 seconds was noted every 2-5 seconds. Hyperventilation and photic stimulation were not performed.   ABNORMALITY - Polyspikes, generalized - Continuous slow, generalized IMPRESSION: This study showed evidence of epileptogenicity with generalized onset. Due to the frequency of epileptform discharges, there is increased risk of seizure recurrence. Additionally there is moderate/severe diffuse encephalopathy. Recomend long term eeg for further monitoring. Dr. Pearlean Brownie was notified. [name]   DG FEMUR MIN 2 VIEWS LEFT  Result Date: 2023/04/13 CLINICAL DATA:  Status post intramedullary nail intertrochanteric left femur. EXAM: LEFT FEMUR 2 VIEWS COMPARISON:  Left femur radiographs 03/13/2023, left hip radiographs 02/24/2023 FINDINGS: There is diffuse decreased bone mineralization. Interval cephalomedullary nail fixation of the previously seen left intradural trochanteric proximal femoral fracture. No evidence of hardware complication. Expected postoperative changes including lateral left hip subcutaneous air and surgical skin staples. High-grade atherosclerotic calcifications. Vascular stents are seen within the superficial femoral artery. IMPRESSION: Interval cephalomedullary nail fixation of the  previously seen left intertrochanteric proximal femoral fracture. No evidence of hardware complication. Electronically Signed   By: Neita Garnet M.D.   On: April 13, 2023 13:23   DG HIP UNILAT WITH PELVIS 2-3 VIEWS LEFT  Result Date: 04-13-2023 CLINICAL DATA:  SURGERY, ELECTIVE.  INTRAMEDULLARY NAIL. EXAM: DG HIP (WITH OR WITHOUT PELVIS) 4V LEFT COMPARISON:  LEFT HIP RADIOGRAPHS 03/26/2023 FLUOROSCOPY: Exposure Index (as provided by the fluoroscopic device): PATIENT'S 6.95 4 INTRAOPERATIVE IMAGES DEMONSTRATE REDUCTION OF THE INTERTROCHANTERIC FRACTURE. AND INTRAMEDULLARY ROD WAS PLACED. TWO PROXIMAL DYNAMIC SCREWS ARE PRESENT. A SINGLE DISTAL INTERLOCKING SCREW IS PRESENT. VASCULAR CALCIFICATIONS ARE PRESENT. FINDINGS: Four intraoperative images demonstrate reduction of the intertrochanteric fracture. An intramedullary rod was placed. Two proximal interlocking screws are present. A single distal interlocking screw is present. Vascular calcifications are present. IMPRESSION: 1. ORIF of left intertrochanteric fracture without radiographic evidence for complication. 2. Atherosclerosis. Electronically Signed   By:  Marin Roberts M.D.   On: 04-03-2023 13:00   DG C-Arm 1-60 Min-No Report  Result Date: 04/03/23 Fluoroscopy was utilized by the requesting physician.  No radiographic interpretation.   DG C-Arm 1-60 Min-No Report  Result Date: April 03, 2023 Fluoroscopy was utilized by the requesting physician.  No radiographic interpretation.   MR ANGIO HEAD WO CONTRAST  Result Date: 03/13/2023 CLINICAL DATA:  Neuro deficit, acute, stroke suspected. EXAM: MRA HEAD WITHOUT CONTRAST TECHNIQUE: Angiographic images of the Circle of Willis were acquired using MRA technique without intravenous contrast. COMPARISON:  MRI brain 03/09/2023. FINDINGS: Anterior circulation: Motion degraded study. Within this limitation, no proximal large vessel occlusion, aneurysm, or evidence of vascular malformation. Posterior  circulation: Motion degraded study. Within this limitation, visualized portions of the vertebral arteries and basilar artery are patent without stenosis or aneurysm. The PICAs are patent proximally. The AICAs are poorly visualized but patent at the origins. The SCAs are patent proximally. PCAs are patent proximally without stenosis or evidence of aneurysm. Anatomic variants: None. Other: None. IMPRESSION: Motion degraded study. Within this limitation, no proximal large vessel occlusion or high-grade stenosis. Electronically Signed   By: Orvan Falconer M.D.   On: 03/13/2023 08:30   MR BRAIN WO CONTRAST  Result Date: 03/20/2023 CLINICAL DATA:  Stroke suspected, neuro deficit EXAM: MRI HEAD WITHOUT CONTRAST TECHNIQUE: Multiplanar, multiecho pulse sequences of the brain and surrounding structures were obtained without intravenous contrast. COMPARISON:  07/27/2020 MRI head, 03/19/2023 CT head FINDINGS: Evaluation is somewhat limited by motion artifact. Brain: Restricted diffusion with ADC correlate in the right brachium pontis, which is associated with increased T2 hyperintense signal, consistent with acute to early subacute infarct. No acute hemorrhage, mass, mass effect, or midline shift. No hydrocephalus or extra-axial collection. Normal pituitary and craniocervical junction. No hemosiderin deposition to suggest remote hemorrhage. Advanced cerebral atrophy for age. Ex vacuo dilatation of the ventricles. T2 hyperintense signal in the periventricular white matter, likely the sequela of chronic small vessel ischemic disease. Vascular: Normal arterial flow voids. Skull and upper cervical spine: Normal marrow signal. Sinuses/Orbits: Clear paranasal sinuses. No acute finding in the orbits. Status post bilateral lens replacements. Other: Trace fluid in the mastoid air cells. IMPRESSION: Acute to early subacute infarct in the right brachium pontis. These results were called by telephone at the time of interpretation on  03/25/2023 at 8:37 pm to provider Alvino Blood , who verbally acknowledged these results. Electronically Signed   By: Wiliam Ke M.D.   On: 03/05/2023 20:37   DG FEMUR MIN 2 VIEWS LEFT  Result Date: 03/05/2023 CLINICAL DATA:  Left thigh pain after fall. EXAM: LEFT FEMUR 2 VIEWS COMPARISON:  None Available. FINDINGS: Moderately displaced intertrochanteric fracture is seen involving proximal left femur. Middle and distal portions left femur are unremarkable. IMPRESSION: Moderately displaced intertrochanteric fracture of proximal left femur. Electronically Signed   By: Lupita Raider M.D.   On: 03/19/2023 19:36   DG Hip Unilat W or Wo Pelvis 2-3 Views Left  Result Date: 03/24/2023 CLINICAL DATA:  Fall, left hip pain EXAM: DG HIP (WITH OR WITHOUT PELVIS) 2-3V LEFT COMPARISON:  06/09/2007 FINDINGS: Acute intertrochanteric fracture of the proximal left femur with varus angulation. Hip joint alignment is maintained without dislocation. No additional fractures are seen. Bony pelvis intact. Severe atherosclerotic vascular calcifications. IMPRESSION: Acute intertrochanteric fracture of the proximal left femur. Electronically Signed   By: Duanne Guess D.O.   On: 03/26/2023 16:56   CT Head Wo Contrast  Result Date: 03/03/2023 CLINICAL  DATA:  Head trauma, minor (Age >= 65y) EXAM: CT HEAD WITHOUT CONTRAST TECHNIQUE: Contiguous axial images were obtained from the base of the skull through the vertex without intravenous contrast. RADIATION DOSE REDUCTION: This exam was performed according to the departmental dose-optimization program which includes automated exposure control, adjustment of the mA and/or kV according to patient size and/or use of iterative reconstruction technique. COMPARISON:  Head CT 11/20/2022 FINDINGS: Brain: No hemorrhage. No hydrocephalus. No extra-axial fluid collection. Asymmetric hypodensity in the medial aspect of the right cerebellum and in the brachium pontis (series 3, image  9). No mass effect. No mass lesion. Mineralization of the basal ganglia bilaterally. Unchanged slightly hyperdense appearance of the falx Vascular: No hyperdense vessel or unexpected calcification. Skull: Normal. Negative for fracture or focal lesion. Sinuses/Orbits: No middle ear or mastoid effusion. Paranasal sinuses are clear. Bilateral lens replacement. Orbits are otherwise unremarkable. Other: None. IMPRESSION: 1. Asymmetric hypodensity in the medial aspect of the right cerebellum and in the brachium pontis, which could represent an age-indeterminate infarct. Recommend brain MRI for further evaluation. 2. No acute intracranial hemorrhage. Electronically Signed   By: Lorenza Cambridge M.D.   On: 03/06/2023 16:36   DG Chest 1 View  Result Date: 03/02/2023 CLINICAL DATA:  Fall.  History of CHF. EXAM: CHEST  1 VIEW COMPARISON:  Chest radiograph dated 01/15/2023 FINDINGS: Stable cardiomegaly. Stable right CVC catheter tip terminates in the right atrium. Both lungs are clear. No pneumothorax or pleural effusion. Left axillary vascular stent. No acute osseous abnormality. IMPRESSION: Cardiomegaly.  No acute findings in the chest. Electronically Signed   By: Hart Robinsons M.D.   On: 03/16/2023 16:34     Assessment and Plan:   Atrial fib, RVR -MD to review the ECG and advise if this is truly atrial fibrillation -At this time, she is on metoprolol 12.5 mg twice daily  -Prior to admission, she was on metoprolol XL 12.5 mg daily so this is actually a dose increase. -She is already chronically on Eliquis because of her history of PE/DVT and PAF.  This was held briefly, but has been restarted. -Echo was ordered, follow-up on results -Given her general medical condition, no other intervention indicated at this time  2.  Chronic combined CHF -Volume management by dialysis, so will defer management to Nephrology - gets HD M-W-F  3.  CVA -Felt to be the reason for the fall -She is now having epileptiform  discharges, currently on long-term EEG -Neurology is following  4. Fall with hip fx - s/p repair  - per Ortho  5.  CAD -No ongoing ischemic symptoms.   -Plavix, beta-blocker and statin have been restarted -Follow  Otherwise, per IM    Risk Assessment/Risk Scores:       New York Heart Association (NYHA) Functional Class NYHA Class III  CHA2DS2-VASc Score = 6   This indicates a 9.7% annual risk of stroke. The patient's score is based upon: CHF History: 1 HTN History: 1 Diabetes History: 1 Stroke History: 0 Vascular Disease History: 1 Age Score: 1 Gender Score: 1   For questions or updates, please contact [facility] Please consult [email or web address] for contact info under    Signed, Theodore Demark, PA-C  03/15/2023 2:51 PM   Personally seen and examined. Agree with APP above with the following comments:  Robin [name], a 72 year old female with a complex medical history including end-stage renal disease on hemodialysis (MWF, no prior issues with hypotension reported), paroxysmal atrial fibrillation, coronary artery disease with  prior PCI in 2016, medically managed NSTEMI after, significant dementia, bipolar disease, COPD, type 2 diabetes, and uncontrolled hypertension, presents after fall, hip fracture s/p 03-18-2023 repair and new ischemic stroke.  Today found to have episodes of atrial fibrillation  Historically, her ejection fraction has decreased to 45-50%, and she has a history of non-sustaining VT in the past. She is currently managed on a low dose of Eliquis due to her end-stage renal disease and weight of 52 kilograms  Her cognitive status is significantly impaired, with minimal responsiveness in conversation. She is cared for by her daughter and son, who also assist with medication management.  Son is in the room today, her daughter is the POA and is on the phone. Despite these cardiac issues, she denies any chest pain, shortness of breath, or palpitations.  Patient has a negative review of symptoms.  Exam notable for IRIR rhythm She is a frail female, no murmur on exam CTAB.  No carotid bruits good distal pulses.  Not oriented or conversant. Rest as above  She has been in and out of sinus rhythm, with some abnormal atrial morphologies and evidence of atrial fibrillation and atrial tachycardia, likely most consistent with multifocal atrial tachycardia. An echo is pending today.  Would recommend   Atrial Fibrillation and Atrial Tachycardia New rapid ventricular response after orthopedic surgery. Limited options due to end stage renal disease, dementia, and prolonged QTc. Currently on low dose Eliquis due to end stage renal disease and weight of 52 kg. -Increase Metoprolol from 12.5mg  to 25mg  for AV nodal suppression. -QTC is normal this admission -Consider reducing Eliquis to 2.5mg  once orthopedic needs for higher dose are no longer present. -because of neuro recommendations, our goal is to limited impact on BP as possible  Congestive Heart Failure EF reduced under 50% historically. Not on goal directed medical therapy due to labile blood pressure and end stage renal dysfunction. -If beta blockade is tolerated, consider restarting Isosorbide Mononitrate (post permissive hypertension)  End Stage Renal Disease On hemodialysis. Impacts management of other conditions. -Continue current management with monitoring for HD hypotension  Dementia - Significant history, impacts communication and medication management. -Continue current management, with assistance from patient's son and daughter, will plan for conservative cardiac therapies  Hypertension - hx of labile blood pressures. Changes to hypertensive regimen in 2024. -Permissive hypertension due to seizures and stroke. Slowly adjust AV nodal agent therapy.  Riley Lam, MD FASE [facility] Cardiologist [facility]  [address] [place], [address] [place], [place] [address] [phone]  4:11 PM  ADDENDUM: After exam, patient failed swallow study.  We will transition to IV beta blocker.

## 2023-03-15 NOTE — Progress Notes (Signed)
I was asked to see Ms. Alvillar for some intermittent facial and BUE tremors. She was able to answer simple questions with short answers and while answering the questions, her tremors stopped. She could also focus and identify people on both sides of the bed. When stimulated or engaged in questions, tremors stopped. BUE with equal strength. No facial droop. Daughter at the bedside.

## 2023-03-15 NOTE — Progress Notes (Signed)
Progress Note   Patient: Robin [name] WGN:[id number] DOB: 1951-04-28 DOA: March 28, 2023     3 DOS: the patient was seen and examined on 03/15/2023    Subjective:  -Patient seen and examined  -Events of last night noted. -Patient was reported to have facial twitching/tremors. -Brief A-fib with RVR noted, but resolved. -Patient is back to baseline.  Will transfer patient to progressive unit.  Brief hospital course: Patient is a 72 year old female with multiple medical and cardiac problems.  Patient carries diagnosis of ESRD on HD MWF, coronary artery disease status post PCI with stent placement March 2024, combined CHF with prior documented EF of 45 to 50% (07/29/2022), decreased right ventricular function, paroxysmal atrial fibrillation, type 2 diabetes and hypertension.  Patient was recently admitted by family medicine teaching service at [facility] for chest pain.  Patient was admitted following a fall at home with associated left hip fracture.  Patient underwent left intramedullary nailing for the intertrochanteric trunk and turning leg left femoral fracture yesterday, 03/13/2023.  Right cerebellar infarct was also noted on presentation.  Neurology team is directing care.    Imaging studies: -MRI of the brain also showed acute to early subacute ischemic stroke and neurology is following.  X-ray of the left femur showed left intertrochanteric hip fracture.  -Neurologist and orthopedics on board  03/15/2023: See above documentation.  Patient was noted to have twitching/tremor of the face and upper extremities.  Phosphorus of 7 of yesterday, and now 7.5.  Communicated elevated phosphorus level to nephrology team.  Nephrology team plans to adjust dose of Renvela.  Neurology team thinks that patient likely has seizures.  Lactic acid and stat EEG ordered.  Input from neurology team is highly appreciated.  Patient will be loaded with valproic acid (as patient has ESRD).  Brief A-fib with RVR was noted, but  responded to IV Lopressor 5 Mg.  Heart rate is down to 8 1 bpm.  Orthopedics team has confirmed that it is okay to resume antiplatelets and anticoagulation.  Patient was on Eliquis and Plavix prior to presentation.  Patient be transferred to progressive care unit.  Result of repeat echocardiogram with bubbles is still pending.  Patient is currently back to baseline.   Assessment and Plan: Close left hip fracture status post mechanical fall, POA -See above documentation. -Patient has undergone surgery. -Postop management as per orthopedic team. -PT OT input 1 patient is more stable. -Patient is DNR (discussed with patient's 2 daughters.  Patient's 2 daughters were also updated).     Acute to early subacute infarct in the right brachium pontis, seen on brain MRI Neurology is directing care. Patient was on Eliquis prior to presentation, as well as, Plavix. Follow neurology workup. Follow-up 2D echo with bubble study MRA of the head did not show any significant vessel occlusion Permissive hypertension for the first 24 hours Treat SBP greater than 220 or DBP greater than 120 Monitor blood pressure Continue neurochecks LDH 105, A1c 7.7 Initiate high intensity statin therapy PT/OT/speech's evaluation   Facial and bilateral upper extremity twitching/tremors: -Neurology is directing care. -Lactic acid level ordered. -Stat EEG ordered. -Neurology thinks patient may be having seizure. -Valproic acid loading as per neurology team.  Type 2 diabetes with hyperglycemia Continue current insulin therapy Continue to optimize.   Coronary artery disease status post PCI with stenting Home Plavix and Eliquis held in anticipation for orthopedic surgery No reported anginal symptoms.   History of thromboembolism DVT, chronic thromboembolic pulmonary hypertension Home Eliquis held. Resume Eliquis  when okay with orthopedic surgery.   ESRD on HD MWF Nephrologist on board for dialysis  needs  Hyperphosphatemia/CKD-MBD: -Nephrology is directing care. -Dose of Renvela will be elevated. -Phosphorus today was 7.5.   Bipolar disorder Continue home regimen.   Hypertension Holding antihypertensives at this time on account of permissive hypertension   HFrEF 45% -Euvolemic on exam -Echocardiogram done in March 2024 revealed estimated EF of 45 to 50%, grade 2 diastolic dysfunction and impaired right ventricular function. -Holding antihypertensives at this time on account of permissive hypertension Strict I's and O's and daily  Paroxysmal atrial fibrillation with RVR: -Brief RVR noted today. -Responded to IV Lopressor 5 Mg x 1 dose.  Heart rate is down to 81 bpm. -Will have a low threshold to consult the cardiology team. -Transfer patient to progressive unit.  Guarded prognosis.  Patient is DNR.    DVT prophylaxis: Home Eliquis held due to anticipated orthopedic surgery.  Restart home Eliquis when okay with orthopedic surgery.   Code Status: DNR   Family Communication: 2 daughters (1 was at the bedside).   Disposition Plan: Admitted to telemetry surgical unit.  Patient will be transferred to progressive unit.   Consults called: Orthopedic surgery, neurology/stroke team, nephrology.   Admission status: Inpatient status.     Status is: Inpatient.  Physical Exam: General condition: Patient is not in any distress.  Patient is awake and alert. HEENT: Patient is pale.  No jaundice. Neck: Supple. Lungs: Clear to auscultation. CVS: S1-S2, irregularly irregular.    Abdomen: Soft and nontender. Neuro: Awake and alert. Extremities: No leg edema.     Vitals:   03/15/23 0539 03/15/23 0831 03/15/23 1107 03/15/23 1110  BP: 104/69 (!) 153/96 132/73 (!) 151/67  Pulse:  98    Resp: (!) 22 20 (!) 24 (!) 22  Temp: 99 F (37.2 C) 98.8 F (37.1 C)    TempSrc: Oral Oral    SpO2: 100%  100% 100%  Weight:      Height:          Latest Ref Rng & Units 03/15/2023     9:30 AM 03/06/2023    4:48 AM 03/13/2023    1:00 PM  CBC  WBC 4.0 - 10.5 K/uL 12.1  12.4  9.4   Hemoglobin 12.0 - 15.0 g/dL 78.2  95.6  21.3   Hematocrit 36.0 - 46.0 % 31.7  37.0  32.2   Platelets 150 - 400 K/uL 154  185  180        Latest Ref Rng & Units 03/15/2023    9:30 AM 03/07/2023    4:48 AM 03/13/2023    1:00 PM  CMP  Glucose 70 - 99 mg/dL 086  578  469   BUN 8 - 23 mg/dL 36  16  23   Creatinine 0.44 - 1.00 mg/dL 6.29  5.28  4.13   Sodium 135 - 145 mmol/L 133  135  140   Potassium 3.5 - 5.1 mmol/L 5.1  3.8  4.4   Chloride 98 - 111 mmol/L 90  88  97   CO2 22 - 32 mmol/L 19  17  25    Calcium 8.9 - 10.3 mg/dL 9.7  24.4  9.5      Time spent: 55 minutes.  Author: Barnetta Chapel, MD 03/15/2023 12:02 PM  For on call review [email or web address].

## 2023-03-15 NOTE — TOC CAGE-AID Note (Signed)
Transition of Care [facility]) - CAGE-AID Screening   Patient Details  Name: Robin [name] MRN: [id number] Date of Birth: 1951/04/23  Clinical Narrative:  Patient denies any alcohol or drug use, no need to provide substance abuse resources at this time.  CAGE-AID Screening:    Have You Ever Felt You Ought to Cut Down on Your Drinking or Drug Use?: No Have People Annoyed You By Critizing Your Drinking Or Drug Use?: No Have You Felt Bad Or Guilty About Your Drinking Or Drug Use?: No Have You Ever Had a Drink or Used Drugs First Thing In The Morning to Steady Your Nerves or to Get Rid of a Hangover?: No CAGE-AID Score: 0  Substance Abuse Education Offered: No

## 2023-03-15 NOTE — Progress Notes (Signed)
[facility] Progress Note   Subjective:   Patient seen and examined at bedside with daughter present.  New tremor and aphasia starting around 3AM per daughter.  Rapid response called and assessed.  Able to answer questions with 1 word answered.  Tremor improves when resting.  No pain meds given overnight.  Denies CP, SOB and pain.   Objective Vitals:   03/14/23 1943 03/15/23 0442 03/15/23 0539 03/15/23 0831  BP: 96/73 96/77 104/69 (!) 153/96  Pulse:  (!) 106  98  Resp: 16  (!) 22 20  Temp: 97.8 F (36.6 C) 97.8 F (36.6 C) 99 F (37.2 C) 98.8 F (37.1 C)  TempSrc: Oral Axillary Oral Oral  SpO2: 100% 99% 100%   Weight:      Height:       Physical Exam General:chronically ill appearing female with new tremor and aphasia Heart:IRIR, no mrg Lungs:nml WOB on 1L O2, CTAB anteriorly  Abdomen:soft, NTND Extremities:no LE edema Dialysis Access: [facility]   Filed Weights   03/13/23 1330 03/13/23 1731 03/14/23 0713  Weight: 54.7 kg 52.9 kg 52.9 kg    Intake/Output Summary (Last 24 hours) at 03/15/2023 0832 Last data filed at 03/14/2023 0929 Gross per 24 hour  Intake 500 ml  Output 50 ml  Net 450 ml    Additional Objective Labs: Basic Metabolic Panel: Recent Labs  Lab 03/13/23 1238 03/13/23 1239 03/13/23 1300 03/14/23 0448  NA 140  --  140 135  K 4.4  --  4.4 3.8  CL 97*  --  97* 88*  CO2 24  --  25 17*  GLUCOSE 96  --  112* 252*  BUN 22  --  23 16  CREATININE 6.37*  --  6.66* 4.20*  CALCIUM 9.9  --  9.5 10.0  PHOS 6.8* 6.7* 6.7* 7.0*   Liver Function Tests: Recent Labs  Lab 03/13/23 1238 03/13/23 1300  ALBUMIN 3.1* 3.1*   CBC: Recent Labs  Lab 03/12/23 1702 03/12/23 1813 03/13/23 1238 03/13/23 1300 03/14/23 0448  WBC 8.4  --  9.1 9.4 12.4*  NEUTROABS 6.8  --   --   --   --   HGB 10.0*   < > 10.8* 10.4* 12.1  HCT 31.4*   < > 33.2* 32.2* 37.0  MCV 96.0  --  94.3 97.0 95.1  PLT 178  --  189 180 185   < > = values in this interval not  displayed.   Studies/Results: DG FEMUR MIN 2 VIEWS LEFT  Result Date: 03/14/2023 CLINICAL DATA:  Status post intramedullary nail intertrochanteric left femur. EXAM: LEFT FEMUR 2 VIEWS COMPARISON:  Left femur radiographs 03/12/2023, left hip radiographs 03/12/2023 FINDINGS: There is diffuse decreased bone mineralization. Interval cephalomedullary nail fixation of the previously seen left intradural trochanteric proximal femoral fracture. No evidence of hardware complication. Expected postoperative changes including lateral left hip subcutaneous air and surgical skin staples. High-grade atherosclerotic calcifications. Vascular stents are seen within the superficial femoral artery. IMPRESSION: Interval cephalomedullary nail fixation of the previously seen left intertrochanteric proximal femoral fracture. No evidence of hardware complication. Electronically Signed   By: Neita Garnet M.D.   On: 03/14/2023 13:23   DG HIP UNILAT WITH PELVIS 2-3 VIEWS LEFT  Result Date: 03/14/2023 CLINICAL DATA:  SURGERY, ELECTIVE.  INTRAMEDULLARY NAIL. EXAM: DG HIP (WITH OR WITHOUT PELVIS) 4V LEFT COMPARISON:  LEFT HIP RADIOGRAPHS 03/12/2023 FLUOROSCOPY: Exposure Index (as provided by the fluoroscopic device): PATIENT'S 6.95 4 INTRAOPERATIVE IMAGES DEMONSTRATE REDUCTION OF THE INTERTROCHANTERIC  FRACTURE. AND INTRAMEDULLARY ROD WAS PLACED. TWO PROXIMAL DYNAMIC SCREWS ARE PRESENT. A SINGLE DISTAL INTERLOCKING SCREW IS PRESENT. VASCULAR CALCIFICATIONS ARE PRESENT. FINDINGS: Four intraoperative images demonstrate reduction of the intertrochanteric fracture. An intramedullary rod was placed. Two proximal interlocking screws are present. A single distal interlocking screw is present. Vascular calcifications are present. IMPRESSION: 1. ORIF of left intertrochanteric fracture without radiographic evidence for complication. 2. Atherosclerosis. Electronically Signed   By: Marin Roberts M.D.   On: 03/14/2023 13:00   DG C-Arm 1-60  Min-No Report  Result Date: 03/14/2023 Fluoroscopy was utilized by the requesting physician.  No radiographic interpretation.   DG C-Arm 1-60 Min-No Report  Result Date: 03/14/2023 Fluoroscopy was utilized by the requesting physician.  No radiographic interpretation.    Medications:  tranexamic acid      atorvastatin  40 mg Oral Daily   Chlorhexidine Gluconate Cloth  6 each Topical Q0600   doxercalciferol  5 mcg Intravenous Q M,W,F-HD   insulin aspart  0-5 Units Subcutaneous QHS   insulin aspart  0-6 Units Subcutaneous TID WC   metoprolol tartrate  12.5 mg Oral Once   multivitamin  1 tablet Oral QHS   pantoprazole  40 mg Oral QHS   sevelamer carbonate  800 mg Oral TID WC   umeclidinium bromide  1 puff Inhalation Daily    Dialysis Orders: MWF  - [facility]  3.5hrs, BFR 300, DFR AF 1.5,  EDW 54.2kg, 2K/ 2Ca UFP 1   Access: AVF, TDC  Heparin none Mircera 30 mcg q4wks - last 10/7 Venofer 100mg  qHD x10 Hectorol IV qHD       Assessment/Plan:  L hip Frx - 2/2 Fall at home.  ORIF on 03/14/23.  Stroke - MRI brain demonstrated a R brachium pontine stroke.  MRA with no acute findings. New aphasia this AM. Neuro following.  Tremor - new started overnight. Etiology unclear. Checking Renal panel this AM, although labs yesterday unremarkable.  ESRD -  on HD MWF. HD tomorrow per regular schedule. No acute indication for HD at this time.  Hypertension/volume  -  BP soft overnight, meds held.  Does not appearing volume overloaded, meeting edw at outpatient. UF as tolerated.   Anemia of CKD - Hgb 12.1. ESA recently dosed.   Secondary Hyperparathyroidism -  Ca in goal. Phos elevated, renvela started.  Continue VDRA.  Nutrition - Renal diet w/fluid restrictions when advanced DMT2 Hx CAD  Virgina Norfolk, PA-C [facility] 03/15/2023,8:32 AM  LOS: 3 days

## 2023-03-15 NOTE — Progress Notes (Signed)
Echo attempted twice, patient moving from 4N to 6E, then EEG in progress. Will do echo on 10/21. [facility] Danh Bayus RDCS

## 2023-03-15 NOTE — Significant Event (Signed)
Rapid Response Event Note   Reason for Call :  HR sustaining >150  Initial Focused Assessment:  Patient lying in bed with bilateral arm twitching and mouth/facial twitching. Neurology team at bedside. Skin warm, dry, minimal edema noted. Noted that all right toes amputated, few left toes amputated. Lungs clear/diminished, heart tones irregular.   132/73 (89) HR 110s-150s RR 24 O2 100% 2L [place]  Interventions:  EKG: afib RVR Neurology rounding, at bedside Primary MD paged 1mg  IV Ativan  5mg  IV metoprolol  EEG ordered, depacon ordered  Plan of Care:  Transfer to PCU  Event Summary:  MD Notified: P. Pearlean Brownie MD, Leonia Corona MD Call Time: [phone] Arrival Time: 1059 End Time: 1130  Truddie Crumble, RN

## 2023-03-15 NOTE — Progress Notes (Signed)
Orthopedic Surgery Progress Note   Assessment: Patient is a 72 y.o. female with left pertrochanteric femur fracture s/p cephalomedullary rodding   Plan: -Operative plans: complete -Okay for diet from orthopedic perspective -Okay to resume home antiplatelets and anticoagulation post-operatively -Antibiotics: ancef x2 post-op doses -Weight bearing status: as tolerated -PT evaluate and treat -Pain control -Dispo: per primary  ___________________________________________________________________________  Subjective: No acute events overnight. Pain controlled. Has not worked with PT yet.     Physical Exam:  General: no acute distress, appears stated age Neurologic: drowsy, following commands, answering yes/no questions Respiratory: unlabored breathing on nasal canula, symmetric chest rise  MSK:   -Left lower extremity  Dime-sized spot of blood over more proximal dressing, otherwise dressings c/d/i TA and GSC intact Partial amputation of toes Sensation intact to light touch over dorsal and planar aspect of foot Foot warm and well perfused   [name] Patient MRN: [id number] Date: 03/15/23

## 2023-03-15 NOTE — Progress Notes (Signed)
PT Cancellation Note  Patient Details Name: Robin [name] MRN: [id number] DOB: January 29, 1951   Cancelled Treatment:    Reason Eval/Treat Not Completed: Other (comment)  New seizures noted, with transfer of units;  Will proceed with PT once pt is more stable;   Plan to check on her tomorrow;   Van Clines, PT  Acute Rehabilitation Services Office [phone] Secure Chat welcomed    Robin [name] 03/15/2023, 2:34 PM

## 2023-03-15 NOTE — Progress Notes (Signed)
STAT EEG complete - results pending. ? ?

## 2023-03-15 NOTE — Evaluation (Signed)
Clinical/Bedside Swallow Evaluation Patient Details  Name: Robin [name] MRN: [id number] Date of Birth: [birth year]  Today's Date: 03/15/2023 Time: SLP Start Time (ACUTE ONLY): 1442 SLP Stop Time (ACUTE ONLY): 1453 SLP Time Calculation (min) (ACUTE ONLY): 11 min  Past Medical History:  Past Medical History:  Diagnosis Date   Acute delirium 11/18/2014   Acute encephalopathy    Acute on chronic respiratory failure with hypoxia (HCC) 09/09/2016   Acute respiratory failure with hypoxia (HCC) 11/29/2015   Anemia in chronic kidney disease 12/09/2014   Anxiety    Arthritis    Benign hypertension    Bipolar affective disorder (HCC) 12/09/2014   CAD (coronary artery disease), native coronary artery with 2 stents  11/17/2014   Charcot foot due to diabetes mellitus (HCC)    Chronic combined systolic and diastolic CHF (congestive heart failure) (HCC) 11/18/2014   Closed left ankle fracture 11/17/2014   Confusion 01/21/2015   Depression    Diabetes mellitus without complication (HCC)    End stage renal disease on dialysis (HCC)    Fracture dislocation of ankle 11/17/2014   GERD (gastroesophageal reflux disease)    Heart murmur    History of blood transfusion    History of bronchitis    History of pneumonia    Hyperlipidemia 03/26/2015   Hypertension associated with diabetes (HCC) 11/18/2014   Hypertensive heart/renal disease with failure (HCC) 12/09/2014   Hypokalemia 11/17/2014   Multiple falls 01/21/2015   Nausea & vomiting 01/15/2023   Obesity 11/17/2014   Onychomycosis 10/07/2016   Right leg DVT (HCC) 12/05/2015   SIRS (systemic inflammatory response syndrome) (HCC) 08/08/2016   Sleep apnea    Unstable angina (HCC) 12/26/2017   Ventricular tachycardia (HCC)    Past Surgical History:  Past Surgical History:  Procedure Laterality Date   A/V FISTULAGRAM N/A 01/03/2020   Procedure: A/V FISTULAGRAM;  Surgeon: Nada Libman, MD;  Location: [facility];  Service: Cardiovascular;  Laterality:  N/A;   A/V FISTULAGRAM Left 03/12/2021   Procedure: A/V FISTULAGRAM;  Surgeon: Nada Libman, MD;  Location: [facility];  Service: Cardiovascular;  Laterality: Left;   ABDOMINAL HYSTERECTOMY     ANGIOPLASTY Left 01/26/2020   Procedure: LEFT ARM GRAPH THROMBECTOMY WITH BALLON ANGIOPLASTY;  Surgeon: Maeola Harman, MD;  Location: [facility] OR;  Service: Vascular;  Laterality: Left;   ANKLE CLOSED REDUCTION N/A 11/17/2014   Procedure: CLOSED REDUCTION ANKLE;  Surgeon: Frederico Hamman, MD;  Location: [facility] OR;  Service: Orthopedics;  Laterality: N/A;   ANKLE FUSION Left 03/16/2015   Procedure: Left Tibiocalcaneal Fusion;  Surgeon: Nadara Mustard, MD;  Location: [facility] OR;  Service: Orthopedics;  Laterality: Left;   APPLICATION OF WOUND VAC Right 08/06/2016   Procedure: APPLICATION OF PREVENA WOUND VAC;  Surgeon: Nadara Mustard, MD;  Location: [facility] OR;  Service: Orthopedics;  Laterality: Right;   AV FISTULA PLACEMENT Left may-2016   done at [facility]   CARDIAC CATHETERIZATION     2 stent    CHOLECYSTECTOMY     CORONARY PRESSURE/FFR STUDY N/A 07/31/2022   Procedure: INTRAVASCULAR PRESSURE WIRE/FFR STUDY;  Surgeon: Orbie Pyo, MD;  Location: [facility];  Service: Cardiovascular;  Laterality: N/A;   CORONARY STENT PLACEMENT     FISTULOGRAM Left 01/26/2020   Procedure: FISTULOGRAM;  Surgeon: Maeola Harman, MD;  Location: [facility] OR;  Service: Vascular;  Laterality: Left;   FOOT ARTHRODESIS Right 08/06/2016   Procedure: Right Foot Fusion Lisfranc Joint;  Surgeon: Nadara Mustard, MD;  Location: [facility] OR;  Service: Orthopedics;  Laterality: Right;   HARDWARE REMOVAL Left 03/16/2015   Procedure: Removal Hardware Left Ankle;  Surgeon: Nadara Mustard, MD;  Location: [facility] OR;  Service: Orthopedics;  Laterality: Left;   IR AV DIALY SHUNT INTRO NEEDLE/INTRACATH INITIAL W/PTA/IMG LEFT  01/05/2018   IR AV DIALY SHUNT INTRO NEEDLE/INTRACATH INITIAL W/PTA/IMG LEFT  10/18/2022   IR FLUORO GUIDE CV LINE  RIGHT  10/18/2022   IR US GUIDE VASC ACCESS LEFT  01/05/2018   IR US GUIDE VASC ACCESS RIGHT  10/18/2022   LEFT HEART CATH AND CORONARY ANGIOGRAPHY N/A 12/28/2017   Procedure: LEFT HEART CATH AND CORONARY ANGIOGRAPHY;  Surgeon: Kathleene Hazel, MD;  Location: [facility];  Service: Cardiovascular;  Laterality: N/A;   LEFT HEART CATH AND CORONARY ANGIOGRAPHY N/A 07/31/2022   Procedure: LEFT HEART CATH AND CORONARY ANGIOGRAPHY;  Surgeon: Orbie Pyo, MD;  Location: [facility];  Service: Cardiovascular;  Laterality: N/A;   LOWER EXTREMITY ANGIOGRAPHY N/A 03/21/2021   Procedure: LOWER EXTREMITY ANGIOGRAPHY;  Surgeon: Cephus Shelling, MD;  Location: [facility];  Service: Cardiovascular;  Laterality: N/A;   ORIF ANKLE FRACTURE Left 11/20/2014   Procedure: OPEN REDUCTION INTERNAL FIXATION (ORIF) ANKLE FRACTURE;  Surgeon: Sheral Apley, MD;  Location: [facility] OR;  Service: Orthopedics;  Laterality: Left;   PERIPHERAL VASCULAR BALLOON ANGIOPLASTY  01/03/2020   Procedure: PERIPHERAL VASCULAR BALLOON ANGIOPLASTY;  Surgeon: Nada Libman, MD;  Location: [facility];  Service: Cardiovascular;;   PERIPHERAL VASCULAR BALLOON ANGIOPLASTY Left 03/12/2021   Procedure: PERIPHERAL VASCULAR BALLOON ANGIOPLASTY;  Surgeon: Nada Libman, MD;  [facility]: [facility];  Service: Cardiovascular;  Laterality: Left;   PERIPHERAL VASCULAR BALLOON ANGIOPLASTY  08/22/2021   Procedure: PERIPHERAL VASCULAR BALLOON ANGIOPLASTY;  Surgeon: Cephus Shelling, MD;  Location: [facility];  Service: Cardiovascular;;  Left AVF PTA   TONSILLECTOMY     HPI:  Robin [name] is a 72 y.o. female who presented with a fall at home.  Found to have L hip fracture and now s/p surgical repair. MRI 10/17 with infarct in R cerebellar peduncle.  Pt with new seizure activity on 10/20.  Pt with medical history significant for ESRD on HD MWF, coronary artery disease status post PCI with stent  placement March 2024, HFrEF 45 to 50% (07/29/2022) type 2 diabetes, hypertension, recently admitted by family medicine teaching service at [facility] for chest pain.    Assessment / Plan / Recommendation  Clinical Impression  Pt presents with clinical indicators of pharyngeal dysphagia in setting of new onset seizure activity with EEG 10/20 finding: "This study showed evidence of epileptogenicity with generalized onset. Due to the frequency of epileptform discharges, there is increased risk of seizure recurrence."  There was twitching/spasming noted around the mouth.  Pt with prolonged and suspected disoraganized oral phase. Pt with cough on 2 of 3 trials of thin liquid by spoon.  Pt required multiple swallows per bolus.  Reflexive cough seemingly weak.  RN and son report good tolerance of ice chips which may be continued for comfort.  Would recommend pt be made NPO especially with ongoing concern for seizure activity. Administer medications by alternate means.    Recommend pt remain NPO with temporary alternate means of nutrition, hydration, and medication.  Pt may have ice chips for comfort after good oral care, when fully awake alert, with upright positioning and 1:1  assistance.   SLP Visit Diagnosis: Dysphagia, unspecified (R13.10)    Aspiration Risk  Moderate aspiration risk    Diet Recommendation NPO;Ice chips PRN after oral care    Medication Administration: Via alternative means Postural Changes: Seated upright at 90 degrees    Other  Recommendations Oral Care Recommendations: Oral care QID;Oral care prior to ice chip/H20    Recommendations for follow up therapy are one component of a multi-disciplinary discharge planning process, led by the attending physician.  Recommendations may be updated based on patient status, additional functional criteria and insurance authorization.  Follow up Recommendations  (TBD, but at present would recommend on going ST at next level of care)       Assistance Recommended at Discharge  N/A  Functional Status Assessment Patient has had a recent decline in their functional status and demonstrates the ability to make significant improvements in function in a reasonable and predictable amount of time.  Frequency and Duration min 2x/week  2 weeks       Prognosis Prognosis for improved oropharyngeal function: Good      Swallow Study   General Date of Onset: 03/23/2023 HPI: JANNEL REMUND is a 72 y.o. female who presented with a fall at home.  Found to have L hip fracture and now s/p surgical repair. MRI 10/17 with infarct in R cerebellar peduncle.  Pt with new seizure activity on 10/20.  Pt with medical history significant for ESRD on HD MWF, coronary artery disease status post PCI with stent placement March 2024, HFrEF 45 to 50% (07/29/2022) type 2 diabetes, hypertension, recently admitted by family medicine teaching service at [facility] for chest pain. Type of Study: Bedside Swallow Evaluation Previous Swallow Assessment: March 2022 with recs for MS/thin Diet Prior to this Study: Regular;Thin liquids (Level 0) Temperature Spikes Noted: No Respiratory Status: Nasal cannula History of Recent Intubation: Yes (for procedure) Behavior/Cognition: Alert;Cooperative Oral Cavity Assessment: Within Functional Limits Oral Care Completed by SLP: No Oral Cavity - Dentition: Edentulous Self-Feeding Abilities: Total assist Patient Positioning: Partially reclined Baseline Vocal Quality: Normal Volitional Cough: Cognitively unable to elicit Volitional Swallow: Unable to elicit    Oral/Motor/Sensory Function Overall Oral Motor/Sensory Function: Mild impairment Facial ROM: Reduced left Facial Symmetry: Abnormal symmetry left Lingual ROM: Reduced right;Reduced left Lingual Symmetry:  (could not test) Lingual Strength: Reduced Velum: Within Functional Limits Mandible: Within Functional Limits   Ice Chips Ice chips: Not tested   Thin Liquid Thin Liquid:  Impaired Presentation: Spoon Oral Phase Functional Implications: Oral holding Pharyngeal  Phase Impairments: Cough - Immediate;Multiple swallows    Nectar Thick Nectar Thick Liquid: Not tested   Honey Thick Honey Thick Liquid: Not tested   Puree Puree: Not tested   Solid     Solid: Not tested      Kerrie Pleasure, MA, CCC-SLP Acute Rehabilitation Services Office: [phone] 03/15/2023,3:10 PM

## 2023-03-15 NOTE — Progress Notes (Addendum)
SLP Cancellation Note  Patient Details Name: Robin [name] MRN: [id number] DOB: 01-Jun-1950   Cancelled treatment:        Attempted to see pt for swallowing assessment.  Pt off floor at time of attempt, she is transferring to a different unit with recent change in neuro status. SLP will reattempt as schedule permits. Given evidence of ongoing epileptogenicity on EEG and findings of CVA 10/17, consider making patient NPO until SLP is able to evaluate swallow function.    Kerrie Pleasure, MA, CCC-SLP Acute Rehabilitation Services Office: [phone] 03/15/2023, 1:53 PM

## 2023-03-15 NOTE — Procedures (Addendum)
[name]  MRN: [id number]  Epilepsy Attending: [name]  Referring Physician/Provider: Elmer Picker, NP Date: 03/15/2023 Duration: 22.36 mins  Patient history: 72yo F with seizure like activity getting eeg to evaluate for seizure  Level of alertness: Awake/ lethargic   AEDs during EEG study: Ativan  Technical aspects: This EEG study was done with scalp electrodes positioned according to the 10-20 International system of electrode placement. Electrical activity was reviewed with band pass filter of 1-70Hz , sensitivity of 7 uV/mm, display speed of 48mm/sec with a 60Hz  notched filter applied as appropriate. EEG data were recorded continuously and digitally stored.  Video monitoring was available and reviewed as appropriate.  Description: EEG showed continuous generalized  3 to 6 Hz theta-delta slowing. Generalized polyspikes and fast activity lasting 1-2 seconds was noted every 2-5 seconds. Hyperventilation and photic stimulation were not performed.     ABNORMALITY - Polyspikes, generalized - Continuous slow, generalized  IMPRESSION: This study showed evidence of epileptogenicity with generalized onset. Due to the frequency of epileptform discharges, there is increased risk of seizure recurrence. Additionally there is moderate/severe diffuse encephalopathy. Recomend long term eeg for further monitoring.  Dr. Pearlean Brownie was notified.  Robin [name]

## 2023-03-15 NOTE — Progress Notes (Signed)
Around 46 patient's daughter called staff to the room complaining that her mother is not behaving normally.  Patient alert, answering questions appropriately, vitals normal, glucose 215, no complaint of pain however she was having intermittent facial and hand tremors.  Got warm blanket and contacted Rapid Response.  Rapid Response came and assessed patient.  No new orders at this time.    Patient is still alert and is currently comfortable.  Messaged on call.  No new orders at this time.

## 2023-03-15 NOTE — Progress Notes (Signed)
   03/15/23 1110  Assess: MEWS Score  BP (!) 151/67  MAP (mmHg) 93  ECG Heart Rate (!) 123  Resp (!) 22  SpO2 100 %  O2 Device Nasal Cannula  O2 Flow Rate (L/min) 2 L/min  Assess: MEWS Score  MEWS Temp 0  MEWS Systolic 0  MEWS Pulse 2  MEWS RR 1  MEWS LOC 0  MEWS Score 3  MEWS Score Color Yellow  Assess: if the MEWS score is Yellow or Red  Were vital signs accurate and taken at a resting state? Yes  Does the patient meet 2 or more of the SIRS criteria? Yes  Does the patient have a confirmed or suspected source of infection? No  MEWS guidelines implemented  Yes, yellow  Treat  MEWS Interventions Considered administering scheduled or prn medications/treatments as ordered  Take Vital Signs  Increase Vital Sign Frequency  Yellow: Q2hr x1, continue Q4hrs until patient remains green for 12hrs  Escalate  MEWS: Escalate Yellow: Discuss with charge nurse and consider notifying provider and/or RRT  Notify: Charge Nurse/RN  Name of Charge Nurse/RN Notified [facility] RN  Provider Notification  Provider Name/Title MD Ogbata  Date Provider Notified 03/15/23  Time Provider Notified 1144  Method of Notification Face-to-face  Notification Reason New onset of dysrhythmia;Change in status  Provider response At bedside  Date of Provider Response 03/15/23  Time of Provider Response 1146  Notify: Rapid Response  Name of Rapid Response RN Notified Hannah RN  Date Rapid Response Notified 03/15/23  Time Rapid Response Notified 1110  Assess: SIRS CRITERIA  SIRS Temperature  0  SIRS Pulse 1  SIRS Respirations  1  SIRS WBC 1  SIRS Score Sum  3

## 2023-03-15 NOTE — Progress Notes (Signed)
VASCULAR LAB    Carotid duplex has been performed.  See CV proc for preliminary results.   Lexie Koehl, RVT 03/15/2023, 4:30 PM

## 2023-03-15 NOTE — Progress Notes (Signed)
  Echocardiogram 2D Echocardiogram has been performed.  Robin [name] 03/15/2023, 5:31 PM

## 2023-03-15 NOTE — Progress Notes (Signed)
Brief Neuro Update:  I spoke with patient's daughter who is also her HCPOA. Daughter is present at the bedside. I discussed about persistent seizure/status on EEG and concern that escalation of treatment at some point could compromise her airway and breathing. Daughter is certainly distraught. She tells me that patient had PEA arrest in Feb 2023 and at that time, was put up on a ventilator and she did not do well.  Daughter want's treatment but not to be aggressive that she ends up needing a ventilator. She wants me to do everything possible to prevent her from being on the vent.  Patient is spotaneously opening her eyes but significnatly encephalopathic. She is clearly protecting her airway. At this time, I will load her up with Keppra 2000mg  IV once and keep an eye on LTM EEG.  I do think that having palliative medicine team on board would be really helpful.  Erick Blinks [facility]

## 2023-03-15 NOTE — Progress Notes (Signed)
STROKE TEAM PROGRESS NOTE   SUBJECTIVE (INTERVAL HISTORY) Informed by RN during rounds that patient was having twitching some tremors of the face and both upper extremities right greater than left.  When we arrived to see the patient she was unresponsive with left eye deviation and extremely tachycardic with heart rate ranging from 150-175 with A-fib with RVR but maintaining a blood pressure and oxygen saturations.  She was found to be having seizures and given 1 mg of IV Ativan which resulted in patient becoming more responsive with heart rate slowing down to the 120s.  Stat EEG was obtained which showed evidence of epileptogenicity with generalized onset.  She was loaded with IV valproic acid 1 g and placed on 1 g Q8 hourly.  Dr. Dartha [name] primary physician was notified.  Patient was to be transferred to stepdown and started on medications for rate control for A-fib.   CT Of the head was requested following which plan was to start anticoagulation with heparin.  No family was present at the bedside.  Patient was placed on long-term EEG monitoring which a few hours later continue to show evidence of seizure activity.  Will order IV Vimpat 200 mg x 1 followed by 100 twice daily in addition to valproic acid 1 g every 6 hourly. I called the patient's daughter's listed number and left a message.  I also called the patient's son whom I spoke to and updated him about the change in the patient's condition and poor prognosis for refractory seizures. OBJECTIVE Temp:  [97.8 F (36.6 C)-99 F (37.2 C)] 98.2 F (36.8 C) (10/20 1405) Pulse Rate:  [98-106] 98 (10/20 0831) Cardiac Rhythm: Other (Comment) (10/20 0845) Resp:  [16-24] 22 (10/20 1110) BP: (96-153)/(62-96) 151/67 (10/20 1110) SpO2:  [95 %-100 %] 100 % (10/20 1110)  Recent Labs  Lab 03/14/23 1630 03/14/23 1948 03/15/23 0438 03/15/23 0828 03/15/23 1104  GLUCAP 275* 139* 215* 211* 204*   Recent Labs  Lab 03/12/23 1755 03/12/23 1813 03/13/23 1051  03/13/23 1238 03/13/23 1239 03/13/23 1300 03/14/23 0448 03/15/23 0930  NA 140   < > 137 140  --  140 135 133*  K 4.0   < > 6.1* 4.4  --  4.4 3.8 5.1  CL 100   < > 98 97*  --  97* 88* 90*  CO2 25  --   --  24  --  25 17* 19*  GLUCOSE 157*   < > 96 96  --  112* 252* 226*  BUN 15   < > 33* 22  --  23 16 36*  CREATININE 5.00*   < > 7.00* 6.37*  --  6.66* 4.20* 6.17*  CALCIUM 9.6  --   --  9.9  --  9.5 10.0 9.7  MG  --   --   --   --   --   --  2.0  --   PHOS  --   --   --  6.8* 6.7* 6.7* 7.0* 7.5*   < > = values in this interval not displayed.   Recent Labs  Lab 03/13/23 1238 03/13/23 1300 03/15/23 0930  ALBUMIN 3.1* 3.1* 3.0*   Recent Labs  Lab 03/12/23 1702 03/12/23 1813 03/13/23 1051 03/13/23 1238 03/13/23 1300 03/14/23 0448 03/15/23 0930  WBC 8.4  --   --  9.1 9.4 12.4* 12.1*  NEUTROABS 6.8  --   --   --   --   --   --   HGB 10.0*   < >  12.6 10.8* 10.4* 12.1 10.1*  HCT 31.4*   < > 37.0 33.2* 32.2* 37.0 31.7*  MCV 96.0  --   --  94.3 97.0 95.1 95.8  PLT 178  --   --  189 180 185 154   < > = values in this interval not displayed.   No results for input(s): "CKTOTAL", "CKMB", "CKMBINDEX", "TROPONINI" in the last 168 hours. No results for input(s): "LABPROT", "INR" in the last 72 hours.  No results for input(s): "COLORURINE", "LABSPEC", "PHURINE", "GLUCOSEU", "HGBUR", "BILIRUBINUR", "KETONESUR", "PROTEINUR", "UROBILINOGEN", "NITRITE", "LEUKOCYTESUR" in the last 72 hours.  Invalid input(s): "APPERANCEUR"     Component Value Date/Time   CHOL 189 03/13/2023 1238   TRIG 74 03/13/2023 1238   HDL 69 03/13/2023 1238   CHOLHDL 2.7 03/13/2023 1238   VLDL 15 03/13/2023 1238   LDLCALC 105 (H) 03/13/2023 1238   Lab Results  Component Value Date   HGBA1C 7.7 (H) 03/13/2023      Component Value Date/Time   LABOPIA NONE DETECTED 10/17/2022 0638   COCAINSCRNUR NONE DETECTED 10/17/2022 0638   LABBENZ NONE DETECTED 10/17/2022 0638   AMPHETMU NONE DETECTED 10/17/2022 0638    THCU NONE DETECTED 10/17/2022 0638   LABBARB NONE DETECTED 10/17/2022 0638    No results for input(s): "ETH" in the last 168 hours.  I have personally reviewed the radiological images below and agree with the radiology interpretations.  EEG adult  Result Date: 03/15/2023 Robin Quest, MD     03/15/2023  2:27 PM [name] MRN: [id number] Epilepsy Attending: Charlsie [name] Referring Physician/Provider: Elmer Picker, NP Date: 03/15/2023 Duration: 22.36 mins Patient history: 72yo F with seizure like activity getting eeg to evaluate for seizure Level of alertness: Awake/ lethargic AEDs during EEG study: Ativan Technical aspects: This EEG study was done with scalp electrodes positioned according to the 10-20 International system of electrode placement. Electrical activity was reviewed with band pass filter of 1-70Hz , sensitivity of 7 uV/mm, display speed of 62mm/sec with a 60Hz  notched filter applied as appropriate. EEG data were recorded continuously and digitally stored.  Video monitoring was available and reviewed as appropriate. Description: EEG showed continuous generalized  3 to 6 Hz theta-delta slowing. Generalized polyspikes and fast activity lasting 1-2 seconds was noted every 2-5 seconds. Hyperventilation and photic stimulation were not performed.   ABNORMALITY - Polyspikes, generalized - Continuous slow, generalized IMPRESSION: This study showed evidence of epileptogenicity with generalized onset. Due to the frequency of epileptform discharges, there is increased risk of seizure recurrence. Additionally there is moderate/severe diffuse encephalopathy. Recomend long term eeg for further monitoring. Dr. Pearlean [name] was notified. Robin [name]   DG FEMUR MIN 2 VIEWS LEFT  Result Date: 03/14/2023 CLINICAL DATA:  Status post intramedullary nail intertrochanteric left femur. EXAM: LEFT FEMUR 2 VIEWS COMPARISON:  Left femur radiographs 03/12/2023, left hip radiographs 03/12/2023  FINDINGS: There is diffuse decreased bone mineralization. Interval cephalomedullary nail fixation of the previously seen left intradural trochanteric proximal femoral fracture. No evidence of hardware complication. Expected postoperative changes including lateral left hip subcutaneous air and surgical skin staples. High-grade atherosclerotic calcifications. Vascular stents are seen within the superficial femoral artery. IMPRESSION: Interval cephalomedullary nail fixation of the previously seen left intertrochanteric proximal femoral fracture. No evidence of hardware complication. Electronically Signed   By: Neita Garnet M.D.   On: 03/14/2023 13:23   DG HIP UNILAT WITH PELVIS 2-3 VIEWS LEFT  Result Date: 03/14/2023 CLINICAL DATA:  SURGERY, ELECTIVE.  INTRAMEDULLARY NAIL.  EXAM: DG HIP (WITH OR WITHOUT PELVIS) 4V LEFT COMPARISON:  LEFT HIP RADIOGRAPHS 03/12/2023 FLUOROSCOPY: Exposure Index (as provided by the fluoroscopic device): PATIENT'S 6.95 4 INTRAOPERATIVE IMAGES DEMONSTRATE REDUCTION OF THE INTERTROCHANTERIC FRACTURE. AND INTRAMEDULLARY ROD WAS PLACED. TWO PROXIMAL DYNAMIC SCREWS ARE PRESENT. A SINGLE DISTAL INTERLOCKING SCREW IS PRESENT. VASCULAR CALCIFICATIONS ARE PRESENT. FINDINGS: Four intraoperative images demonstrate reduction of the intertrochanteric fracture. An intramedullary rod was placed. Two proximal interlocking screws are present. A single distal interlocking screw is present. Vascular calcifications are present. IMPRESSION: 1. ORIF of left intertrochanteric fracture without radiographic evidence for complication. 2. Atherosclerosis. Electronically Signed   By: Marin Roberts M.D.   On: 03/14/2023 13:00   DG C-Arm 1-60 Min-No Report  Result Date: 03/14/2023 Fluoroscopy was utilized by the requesting physician.  No radiographic interpretation.   DG C-Arm 1-60 Min-No Report  Result Date: 03/14/2023 Fluoroscopy was utilized by the requesting physician.  No radiographic  interpretation.   MR ANGIO HEAD WO CONTRAST  Result Date: 03/13/2023 CLINICAL DATA:  Neuro deficit, acute, stroke suspected. EXAM: MRA HEAD WITHOUT CONTRAST TECHNIQUE: Angiographic images of the Circle of Willis were acquired using MRA technique without intravenous contrast. COMPARISON:  MRI brain 03/12/2023. FINDINGS: Anterior circulation: Motion degraded study. Within this limitation, no proximal large vessel occlusion, aneurysm, or evidence of vascular malformation. Posterior circulation: Motion degraded study. Within this limitation, visualized portions of the vertebral arteries and basilar artery are patent without stenosis or aneurysm. The PICAs are patent proximally. The AICAs are poorly visualized but patent at the origins. The SCAs are patent proximally. PCAs are patent proximally without stenosis or evidence of aneurysm. Anatomic variants: None. Other: None. IMPRESSION: Motion degraded study. Within this limitation, no proximal large vessel occlusion or high-grade stenosis. Electronically Signed   By: Orvan Falconer M.D.   On: 03/13/2023 08:30   MR BRAIN WO CONTRAST  Result Date: 03/12/2023 CLINICAL DATA:  Stroke suspected, neuro deficit EXAM: MRI HEAD WITHOUT CONTRAST TECHNIQUE: Multiplanar, multiecho pulse sequences of the brain and surrounding structures were obtained without intravenous contrast. COMPARISON:  07/27/2020 MRI head, 03/12/2023 CT head FINDINGS: Evaluation is somewhat limited by motion artifact. Brain: Restricted diffusion with ADC correlate in the right brachium pontis, which is associated with increased T2 hyperintense signal, consistent with acute to early subacute infarct. No acute hemorrhage, mass, mass effect, or midline shift. No hydrocephalus or extra-axial collection. Normal pituitary and craniocervical junction. No hemosiderin deposition to suggest remote hemorrhage. Advanced cerebral atrophy for age. Ex vacuo dilatation of the ventricles. T2 hyperintense signal in the  periventricular white matter, likely the sequela of chronic small vessel ischemic disease. Vascular: Normal arterial flow voids. Skull and upper cervical spine: Normal marrow signal. Sinuses/Orbits: Clear paranasal sinuses. No acute finding in the orbits. Status post bilateral lens replacements. Other: Trace fluid in the mastoid air cells. IMPRESSION: Acute to early subacute infarct in the right brachium pontis. These results were called by telephone at the time of interpretation on 03/12/2023 at 8:37 pm to provider Alvino Blood , who verbally acknowledged these results. Electronically Signed   By: Wiliam Ke M.D.   On: 03/12/2023 20:37   DG FEMUR MIN 2 VIEWS LEFT  Result Date: 03/12/2023 CLINICAL DATA:  Left thigh pain after fall. EXAM: LEFT FEMUR 2 VIEWS COMPARISON:  None Available. FINDINGS: Moderately displaced intertrochanteric fracture is seen involving proximal left femur. Middle and distal portions left femur are unremarkable. IMPRESSION: Moderately displaced intertrochanteric fracture of proximal left femur. Electronically Signed   By: Fayrene Fearing  Christen Butter M.D.   On: 03/12/2023 19:36   DG Hip Unilat W or Wo Pelvis 2-3 Views Left  Result Date: 03/12/2023 CLINICAL DATA:  Fall, left hip pain EXAM: DG HIP (WITH OR WITHOUT PELVIS) 2-3V LEFT COMPARISON:  06/09/2007 FINDINGS: Acute intertrochanteric fracture of the proximal left femur with varus angulation. Hip joint alignment is maintained without dislocation. No additional fractures are seen. Bony pelvis intact. Severe atherosclerotic vascular calcifications. IMPRESSION: Acute intertrochanteric fracture of the proximal left femur. Electronically Signed   By: Duanne Guess D.O.   On: 03/12/2023 16:56   CT Head Wo Contrast  Result Date: 03/12/2023 CLINICAL DATA:  Head trauma, minor (Age >= 65y) EXAM: CT HEAD WITHOUT CONTRAST TECHNIQUE: Contiguous axial images were obtained from the base of the skull through the vertex without intravenous  contrast. RADIATION DOSE REDUCTION: This exam was performed according to the departmental dose-optimization program which includes automated exposure control, adjustment of the mA and/or kV according to patient size and/or use of iterative reconstruction technique. COMPARISON:  Head CT 11/20/2022 FINDINGS: Brain: No hemorrhage. No hydrocephalus. No extra-axial fluid collection. Asymmetric hypodensity in the medial aspect of the right cerebellum and in the brachium pontis (series 3, image 9). No mass effect. No mass lesion. Mineralization of the basal ganglia bilaterally. Unchanged slightly hyperdense appearance of the falx Vascular: No hyperdense vessel or unexpected calcification. Skull: Normal. Negative for fracture or focal lesion. Sinuses/Orbits: No middle ear or mastoid effusion. Paranasal sinuses are clear. Bilateral lens replacement. Orbits are otherwise unremarkable. Other: None. IMPRESSION: 1. Asymmetric hypodensity in the medial aspect of the right cerebellum and in the brachium pontis, which could represent an age-indeterminate infarct. Recommend brain MRI for further evaluation. 2. No acute intracranial hemorrhage. Electronically Signed   By: Lorenza Cambridge M.D.   On: 03/12/2023 16:36   DG Chest 1 View  Result Date: 03/12/2023 CLINICAL DATA:  Fall.  History of CHF. EXAM: CHEST  1 VIEW COMPARISON:  Chest radiograph dated 01/15/2023 FINDINGS: Stable cardiomegaly. Stable right CVC catheter tip terminates in the right atrium. Both lungs are clear. No pneumothorax or pleural effusion. Left axillary vascular stent. No acute osseous abnormality. IMPRESSION: Cardiomegaly.  No acute findings in the chest. Electronically Signed   By: Hart Robinsons M.D.   On: 03/12/2023 16:34     PHYSICAL EXAM  Temp:  [97.8 F (36.6 C)-99 F (37.2 C)] 98.2 F (36.8 C) (10/20 1405) Pulse Rate:  [98-106] 98 (10/20 0831) Resp:  [16-24] 22 (10/20 1110) BP: (96-153)/(62-96) 151/67 (10/20 1110) SpO2:  [95 %-100 %] 100  % (10/20 1110)  General - Well nourished, well developed pleasant elderly African-American lady, in no apparent distress.  Ophthalmologic - fundi not visualized due to noncooperation.  Cardiovascular - irregularly irregular heart rate and rhythm.  Neuro - awake, alert, eyes open, oriented to age but not able to speak in. No aphasia, able to ask questions in short sentences, however, psychomotor slowing with intermittent perseveration, following all simple commands. Able to name 2/4 and repeat in mildly dysarthric voice. No gaze palsy, tracking bilaterally, visual field full. No facial droop. Tongue midline. Bilateral UEs 4/5, no drift. RLE 3/5 no drift, LLE not able to check due to hip fracture and subjective pain. 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 Sensation symmetrical bilaterally subjectively, b/l FTN intact grossly, gait not tested.    ASSESSMENT/PLAN Robin [name] is a 72 y.o. female with history of CAD on plavix, DM, HTN, HLD, OSA, ESRD  on HD, Afib on eliquis admitted for fall with left hip fracture. But also found to have right cerebellar peduncle infarct. No tPA given due to outside window.    Stroke:  right cerebellar peduncle infarct, likely secondary to small vessel disease source CT head concerning for right cerebellar infarct MRI  right cerebellar peduncle infarct MRA  Motion degraded study. Within this limitation, no proximal large vessel occlusion or high-grade stenosis. Carotid Doppler  pending 2D Echo  pending LDL 105 mg percent.  HgbA1c 7.7 SCDs for VTE prophylaxis clopidogrel 75 mg daily and Eliquis (apixaban) daily prior to admission, now on No antithrombotic given surgery later today and NPO status. Recommend to resume AC and plavix post surgery once hemodynamically stable and clinically appropriate.  Ongoing aggressive stroke risk factor management Therapy recommendations:  pending Disposition:  pending  Left hip  fracture Suspect current stroke caused her fall which made left hip fracture Orthopedics on board, plan for surgery this afternoon Now NPO Recommend to resume AC and plavix post surgery once hemodynamically stable and clinically appropriate.  Chronic afib On home eliquis Now eliquis on hold given upcoming surgery Recommend to resume [facility] post op once hemodynamically stable May consider heparin IV bridge if needed.  Diabetes HgbA1c 7.7 goal < 7.0 Uncontrolled CBG monitoring SSI DM education and close PCP follow up  Hypertension Stable Avoid hypotension perioperatively Long term BP goal normotensive  Hyperlipidemia Home meds:  none  LDL pending, goal < 70 Now NPO for OR today Consider statin if needed  Other Stroke Risk Factors Advanced age CAD on plavix  OSA  Other Active Problems ESRD On HD  Hospital day # 3 Patient is neurological condition has declined today with patient being unresponsive with having focal seizures status which has been refractory to Ativan, IV valproic acid and now Vimpat has been added.  Will leave the patient on overnight long-term EEG monitoring.  Check valproic acid level.  Patient will be transferred to stepdown unit.  Check CT scan of the head.  Long discussion with patient's son over the phone and informed him about turn of events and prognosis and answered questions.  Discussed with Dr. Dartha [name] and Dr Wilford Corner neurohospitalist on-call. This patient is critically ill and at significant risk of neurological worsening, death and care requires constant monitoring of vital signs, hemodynamics,respiratory and cardiac monitoring, extensive review of multiple databases, frequent neurological assessment, discussion with family, other specialists and medical decision making of high complexity.I have made any additions or clarifications directly to the above note.This critical care time does not reflect procedure time, or teaching time or supervisory time of  PA/NP/Med Resident etc but could involve care discussion time.  I spent 30 minutes of neurocritical care time  in the care of  this patient.     Delia Heady, MD Stroke Neurology 03/15/2023 3:04 PM    To contact Stroke Continuity provider, please refer to [email or web address]. After hours, contact General Neurology

## 2023-03-16 DIAGNOSIS — I5022 Chronic systolic (congestive) heart failure: Secondary | ICD-10-CM | POA: Diagnosis not present

## 2023-03-16 DIAGNOSIS — I2510 Atherosclerotic heart disease of native coronary artery without angina pectoris: Secondary | ICD-10-CM

## 2023-03-16 DIAGNOSIS — R569 Unspecified convulsions: Secondary | ICD-10-CM | POA: Diagnosis not present

## 2023-03-16 DIAGNOSIS — G40901 Epilepsy, unspecified, not intractable, with status epilepticus: Secondary | ICD-10-CM | POA: Diagnosis not present

## 2023-03-16 DIAGNOSIS — I4891 Unspecified atrial fibrillation: Secondary | ICD-10-CM | POA: Diagnosis not present

## 2023-03-16 DIAGNOSIS — S72142A Displaced intertrochanteric fracture of left femur, initial encounter for closed fracture: Secondary | ICD-10-CM | POA: Diagnosis not present

## 2023-03-16 LAB — CBC
HCT: 26 % — ABNORMAL LOW (ref 36.0–46.0)
Hemoglobin: 8.4 g/dL — ABNORMAL LOW (ref 12.0–15.0)
MCH: 31 pg (ref 26.0–34.0)
MCHC: 32.3 g/dL (ref 30.0–36.0)
MCV: 95.9 fL (ref 80.0–100.0)
Platelets: 155 10*3/uL (ref 150–400)
RBC: 2.71 MIL/uL — ABNORMAL LOW (ref 3.87–5.11)
RDW: 17.7 % — ABNORMAL HIGH (ref 11.5–15.5)
WBC: 8.4 10*3/uL (ref 4.0–10.5)
nRBC: 0 % (ref 0.0–0.2)

## 2023-03-16 LAB — COMPREHENSIVE METABOLIC PANEL
ALT: <5 U/L (ref 0–44)
AST: 19 U/L (ref 15–41)
Albumin: 2.4 g/dL — ABNORMAL LOW (ref 3.5–5.0)
Alkaline Phosphatase: 79 U/L (ref 38–126)
Anion gap: 19 — ABNORMAL HIGH (ref 5–15)
BUN: 44 mg/dL — ABNORMAL HIGH (ref 8–23)
CO2: 23 mmol/L (ref 22–32)
Calcium: 9.5 mg/dL (ref 8.9–10.3)
Chloride: 94 mmol/L — ABNORMAL LOW (ref 98–111)
Creatinine, Ser: 7.38 mg/dL — ABNORMAL HIGH (ref 0.44–1.00)
GFR, Estimated: 6 mL/min — ABNORMAL LOW (ref >60)
Glucose, Bld: 97 mg/dL (ref 70–99)
Potassium: 4 mmol/L (ref 3.5–5.1)
Sodium: 136 mmol/L (ref 135–145)
Total Bilirubin: 0.7 mg/dL (ref 0.3–1.2)
Total Protein: 5.9 g/dL — ABNORMAL LOW (ref 6.5–8.1)

## 2023-03-16 LAB — GLUCOSE, CAPILLARY
Glucose-Capillary: 108 mg/dL — ABNORMAL HIGH (ref 70–99)
Glucose-Capillary: 83 mg/dL (ref 70–99)
Glucose-Capillary: 77 mg/dL (ref 70–99)
Glucose-Capillary: 68 mg/dL — ABNORMAL LOW (ref 70–99)
Glucose-Capillary: 146 mg/dL — ABNORMAL HIGH (ref 70–99)

## 2023-03-16 LAB — HEMOGLOBIN AND HEMATOCRIT, BLOOD
HCT: 28.7 % — ABNORMAL LOW (ref 36.0–46.0)
Hemoglobin: 9.1 g/dL — ABNORMAL LOW (ref 12.0–15.0)

## 2023-03-16 LAB — VALPROIC ACID LEVEL: Valproic Acid Lvl: 62 ug/mL (ref 50.0–100.0)

## 2023-03-16 LAB — AMMONIA: Ammonia: 32 umol/L (ref 9–35)

## 2023-03-16 MED ORDER — LEVETIRACETAM IN NACL 500 MG/100ML IV SOLN
500.0000 mg | Freq: Two times a day (BID) | INTRAVENOUS | Status: DC
  Administered 2023-03-16: 500 mg via INTRAVENOUS
  Filled 2023-03-16: qty 100

## 2023-03-16 MED ORDER — SODIUM CHLORIDE 0.9 % IV SOLN
250.0000 mg | Freq: Once | INTRAVENOUS | Status: AC
  Administered 2023-03-16: 250 mg via INTRAVENOUS
  Filled 2023-03-16: qty 1.92

## 2023-03-16 MED ORDER — ANTICOAGULANT SODIUM CITRATE 4% (200MG/5ML) IV SOLN: 5.0000 mL | Status: DC | PRN

## 2023-03-16 MED ORDER — SODIUM CHLORIDE 0.9 % IV SOLN
150.0000 mg | Freq: Two times a day (BID) | INTRAVENOUS | Status: DC
  Administered 2023-03-16 – 2023-03-18 (×4): 150 mg via INTRAVENOUS
  Filled 2023-03-16 (×7): qty 15

## 2023-03-16 MED ORDER — HEPARIN SODIUM (PORCINE) 1000 UNIT/ML IJ SOLN
INTRAMUSCULAR | Status: AC
  Administered 2023-03-16: 3800 [IU]
  Filled 2023-03-16: qty 4

## 2023-03-16 MED ORDER — HEPARIN SODIUM (PORCINE) 1000 UNIT/ML DIALYSIS: 1000.0000 [IU] | INTRAMUSCULAR | Status: DC | PRN

## 2023-03-16 MED ORDER — METOPROLOL TARTRATE 5 MG/5ML IV SOLN
5.0000 mg | INTRAVENOUS | Status: DC | PRN
  Administered 2023-03-16: 5 mg via INTRAVENOUS
  Filled 2023-03-16: qty 5

## 2023-03-16 MED ORDER — LEVETIRACETAM IN NACL 500 MG/100ML IV SOLN
500.0000 mg | INTRAVENOUS | Status: DC
  Filled 2023-03-16: qty 100

## 2023-03-16 MED ORDER — HEPARIN (PORCINE) 25000 UT/250ML-% IV SOLN
900.0000 [IU]/h | INTRAVENOUS | Status: DC
  Administered 2023-03-16 – 2023-03-17 (×2): 700 [IU]/h via INTRAVENOUS
  Administered 2023-03-18: 800 [IU]/h via INTRAVENOUS
  Administered 2023-03-20: 750 [IU]/h via INTRAVENOUS
  Administered 2023-03-21: 900 [IU]/h via INTRAVENOUS
  Filled 2023-03-16 (×5): qty 250

## 2023-03-16 MED ORDER — DEXTROSE 50 % IV SOLN
12.5000 g | INTRAVENOUS | Status: AC
  Administered 2023-03-16: 12.5 g via INTRAVENOUS
  Filled 2023-03-16: qty 50

## 2023-03-16 MED ORDER — LEVETIRACETAM IN NACL 500 MG/100ML IV SOLN
500.0000 mg | INTRAVENOUS | Status: DC
  Administered 2023-03-16: 500 mg via INTRAVENOUS
  Filled 2023-03-16 (×3): qty 100

## 2023-03-16 MED ORDER — LEVETIRACETAM IN NACL 1000 MG/100ML IV SOLN
1000.0000 mg | INTRAVENOUS | Status: DC
  Administered 2023-03-17 – 2023-03-18 (×2): 1000 mg via INTRAVENOUS
  Filled 2023-03-16 (×4): qty 100

## 2023-03-16 MED ORDER — ALTEPLASE 2 MG IJ SOLR: 2.0000 mg | Freq: Once | INTRAMUSCULAR | Status: DC | PRN

## 2023-03-16 NOTE — Progress Notes (Signed)
OT Cancellation Note  Patient Details Name: Robin [name] MRN: [id number] DOB: 14-Jan-1951   Cancelled Treatment:    Reason Eval/Treat Not Completed: Patient at procedure or test/ unavailable Off unit for HD. Will follow up for OT eval as schedule permits.  Lorre Munroe 03/16/2023, 10:06 AM

## 2023-03-16 NOTE — Procedures (Addendum)
[name]  MRN: [id number]  Epilepsy Attending: [name]  Referring Physician/Provider: [name], MD  Duration: 03/15/2023 1229 to 10/21/2024229   Patient history: 72yo F with seizure like activity getting eeg to evaluate for seizure   Level of alertness: Awake/ lethargic    AEDs during EEG study: VPA, LCM, LEV   Technical aspects: This EEG study was done with scalp electrodes positioned according to the 10-20 International system of electrode placement. Electrical activity was reviewed with band pass filter of 1-70Hz , sensitivity of 7 uV/mm, display speed of 72mm/sec with a 60Hz  notched filter applied as appropriate. EEG data were recorded continuously and digitally stored.  Video monitoring was available and reviewed as appropriate.   Description: EEG showed continuous generalized  3 to 6 Hz theta-delta slowing. Generalized polyspikes and fast activity lasting 1-3 seconds was noted every 1-2 seconds.  Clinically patient was noted to have tremor-like activity in bilateral upper extremity at times but it was difficult to see if this was time locked with the polyspikes.  Patient was loaded with valproic acid and subsequently with lacosamide without significant improvement in EEG.  Eventually patient was loaded with levetiracetam on 03/15/2023 at around midnight.  However, EEG continued to show continuous generalized 3 to 5 Hz theta-delta slowing admixed with generalized spike and wave as well as polyspikes which appeared near continuous. Hyperventilation and photic stimulation were not performed.      ABNORMALITY - Status epilepticus, generalized - Continuous slow, generalized   IMPRESSION: This study showed evidence of status epilepticus with generalized onset.  Additionally there is moderate to severe diffuse encephalopathy.    [name]

## 2023-03-16 NOTE — Progress Notes (Signed)
vLTM maintenance  All impedances below 10kohms.  No skin breakdown noted at A2  FZ  T8 T4

## 2023-03-16 NOTE — Progress Notes (Signed)
Pt transported back from HD LTM EEG resumed

## 2023-03-16 NOTE — Progress Notes (Addendum)
arteries. *See table(s) above for measurements and observations.     Preliminary    ECHOCARDIOGRAM COMPLETE BUBBLE STUDY  Result Date: 03/15/2023    ECHOCARDIOGRAM REPORT   Patient Name:   Robin [name] Date of Exam: 03/15/2023 Medical Rec #:  [id number]    Height:       64.0 in Accession #:    [id number]   Weight:       116.6 lb Date of Birth:  [birth year]    BSA:          1.556 m Patient Age:    72 years     BP:           206/187 mmHg Patient Gender: F            HR:           96 bpm. Exam Location:  Inpatient Procedure: 2D Echo, Cardiac Doppler and Color Doppler Indications:    stroke  History:        Patient has prior history of Echocardiogram examinations, most                 recent 07/29/2022. CHF, end stage renal disease, Arrythmias:PAC;                 Risk Factors:Diabetes, Dyslipidemia and Current Smoker.  Sonographer:    Delcie Roch RDCS Referring Phys: [id number] MICHAEL A MOORE IMPRESSIONS  1. Left ventricular ejection fraction, by estimation, is 40 to 45%. The left ventricle has mildly decreased function. The left ventricle demonstrates global hypokinesis. There is moderate concentric left ventricular hypertrophy. Left ventricular diastolic parameters are indeterminate.  2. Right ventricular systolic function is mildly reduced. The right ventricular size is not  well visualized. There is normal pulmonary artery systolic pressure.  3. The mitral valve is abnormal. Mild mitral valve regurgitation. No evidence of mitral stenosis. The mean mitral valve gradient is 2.0 mmHg. Moderate mitral annular calcification.  4. The aortic valve is tricuspid. Aortic valve regurgitation is not visualized. Aortic valve sclerosis is present, with no evidence of aortic valve stenosis.  5. The inferior vena cava is normal in size with greater than 50% respiratory variability, suggesting right atrial pressure of 3 mmHg.  6. Agitated saline contrast bubble study was negative, with no evidence of any interatrial shunt. FINDINGS  Left Ventricle: Left ventricular ejection fraction, by estimation, is 40 to 45%. The left ventricle has mildly decreased function. The left ventricle demonstrates global hypokinesis. The left ventricular internal cavity size was normal in size. There is  moderate concentric left ventricular hypertrophy. Left ventricular diastolic parameters are indeterminate. Right Ventricle: The right ventricular size is not well visualized. No increase in right ventricular wall thickness. Right ventricular systolic function is mildly reduced. There is normal pulmonary artery systolic pressure. The tricuspid regurgitant velocity is 2.77 m/s, and with an assumed right atrial pressure of 3 mmHg, the estimated right ventricular systolic pressure is 33.7 mmHg. Left Atrium: Left atrial size was normal in size. Right Atrium: Right atrial size was normal in size. Pericardium: There is no evidence of pericardial effusion. Mitral Valve: The mitral valve is abnormal. Moderate mitral annular calcification. Mild mitral valve regurgitation. No evidence of mitral valve stenosis. The mean mitral valve gradient is 2.0 mmHg with average heart rate of 74 bpm. Tricuspid Valve: The tricuspid valve is normal in structure. Tricuspid valve regurgitation is mild . No evidence of tricuspid stenosis. Aortic Valve:  The aortic valve is tricuspid. Aortic valve regurgitation is not  arteries. *See table(s) above for measurements and observations.     Preliminary    ECHOCARDIOGRAM COMPLETE BUBBLE STUDY  Result Date: 03/15/2023    ECHOCARDIOGRAM REPORT   Patient Name:   Robin [name] Date of Exam: 03/15/2023 Medical Rec #:  [id number]    Height:       64.0 in Accession #:    [id number]   Weight:       116.6 lb Date of Birth:  [birth year]    BSA:          1.556 m Patient Age:    72 years     BP:           206/187 mmHg Patient Gender: F            HR:           96 bpm. Exam Location:  Inpatient Procedure: 2D Echo, Cardiac Doppler and Color Doppler Indications:    stroke  History:        Patient has prior history of Echocardiogram examinations, most                 recent 07/29/2022. CHF, end stage renal disease, Arrythmias:PAC;                 Risk Factors:Diabetes, Dyslipidemia and Current Smoker.  Sonographer:    Delcie Roch RDCS Referring Phys: [id number] MICHAEL A MOORE IMPRESSIONS  1. Left ventricular ejection fraction, by estimation, is 40 to 45%. The left ventricle has mildly decreased function. The left ventricle demonstrates global hypokinesis. There is moderate concentric left ventricular hypertrophy. Left ventricular diastolic parameters are indeterminate.  2. Right ventricular systolic function is mildly reduced. The right ventricular size is not  well visualized. There is normal pulmonary artery systolic pressure.  3. The mitral valve is abnormal. Mild mitral valve regurgitation. No evidence of mitral stenosis. The mean mitral valve gradient is 2.0 mmHg. Moderate mitral annular calcification.  4. The aortic valve is tricuspid. Aortic valve regurgitation is not visualized. Aortic valve sclerosis is present, with no evidence of aortic valve stenosis.  5. The inferior vena cava is normal in size with greater than 50% respiratory variability, suggesting right atrial pressure of 3 mmHg.  6. Agitated saline contrast bubble study was negative, with no evidence of any interatrial shunt. FINDINGS  Left Ventricle: Left ventricular ejection fraction, by estimation, is 40 to 45%. The left ventricle has mildly decreased function. The left ventricle demonstrates global hypokinesis. The left ventricular internal cavity size was normal in size. There is  moderate concentric left ventricular hypertrophy. Left ventricular diastolic parameters are indeterminate. Right Ventricle: The right ventricular size is not well visualized. No increase in right ventricular wall thickness. Right ventricular systolic function is mildly reduced. There is normal pulmonary artery systolic pressure. The tricuspid regurgitant velocity is 2.77 m/s, and with an assumed right atrial pressure of 3 mmHg, the estimated right ventricular systolic pressure is 33.7 mmHg. Left Atrium: Left atrial size was normal in size. Right Atrium: Right atrial size was normal in size. Pericardium: There is no evidence of pericardial effusion. Mitral Valve: The mitral valve is abnormal. Moderate mitral annular calcification. Mild mitral valve regurgitation. No evidence of mitral valve stenosis. The mean mitral valve gradient is 2.0 mmHg with average heart rate of 74 bpm. Tricuspid Valve: The tricuspid valve is normal in structure. Tricuspid valve regurgitation is mild . No evidence of tricuspid stenosis. Aortic Valve:  The aortic valve is tricuspid. Aortic valve regurgitation is not  finding. Other: The mastoid air cells are well aerated. IMPRESSION: 1. New hypodensity in the right greater than left aspect of the pons, which may be artifactual. Consider MRI for further evaluation. 2. 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 No acute intracranial hemorrhage. These results will be called to the ordering clinician or representative by the Radiologist Assistant, and communication documented in the PACS or [employer]. Electronically Signed   By: Wiliam Ke M.D.   On: 03/15/2023 19:51   VAS US CAROTID  Result Date: 03/15/2023 Carotid Arterial Duplex Study Patient Name:  Robin [name]  Date of Exam:   03/15/2023 Medical Rec #: [id number]     Accession #:    [id number] Date of Birth: [birth year]      Patient Gender: F Patient Age:   43 years Exam Location:  [facility] Procedure:      VAS US CAROTID Referring Phys: Willia Craze --------------------------------------------------------------------------------  Indications:       CVA and Fall, hip fracture, s/p IM repair. Risk Factors:      Hypertension, hyperlipidemia, Diabetes, coronary artery                    disease. Other Factors:     ESRD, on dialysis. Limitations        Today's exam was limited due to the patient's inability or                    unwillingness to cooperate and the body habitus of the                    patient. Comparison Study:  No prior study Performing Technologist: Sherren Kerns RVS  Examination Guidelines: A complete evaluation includes B-mode imaging, spectral Doppler, color Doppler, [name] Bilateral testing is considered an integral part of a complete examination. Limited examinations for reoccurring indications may be performed as noted.  Right Carotid Findings: +----------+--------+--------+--------+------------------+------------------+           PSV cm/sEDV cm/sStenosisPlaque DescriptionComments           +----------+--------+--------+--------+------------------+------------------+ CCA Prox  42      7                                 intimal thickening +----------+--------+--------+--------+------------------+------------------+ CCA Distal47      12                                intimal thickening +----------+--------+--------+--------+------------------+------------------+ ICA Prox  59      19              calcific                             +----------+--------+--------+--------+------------------+------------------+ ICA Mid   60      12                                                   +----------+--------+--------+--------+------------------+------------------+ ICA Distal51      10                                                    +----------+--------+--------+--------+------------------+------------------+  finding. Other: The mastoid air cells are well aerated. IMPRESSION: 1. New hypodensity in the right greater than left aspect of the pons, which may be artifactual. Consider MRI for further evaluation. 2. 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 No acute intracranial hemorrhage. These results will be called to the ordering clinician or representative by the Radiologist Assistant, and communication documented in the PACS or [employer]. Electronically Signed   By: Wiliam Ke M.D.   On: 03/15/2023 19:51   VAS US CAROTID  Result Date: 03/15/2023 Carotid Arterial Duplex Study Patient Name:  Robin [name]  Date of Exam:   03/15/2023 Medical Rec #: [id number]     Accession #:    [id number] Date of Birth: [birth year]      Patient Gender: F Patient Age:   43 years Exam Location:  [facility] Procedure:      VAS US CAROTID Referring Phys: Willia Craze --------------------------------------------------------------------------------  Indications:       CVA and Fall, hip fracture, s/p IM repair. Risk Factors:      Hypertension, hyperlipidemia, Diabetes, coronary artery                    disease. Other Factors:     ESRD, on dialysis. Limitations        Today's exam was limited due to the patient's inability or                    unwillingness to cooperate and the body habitus of the                    patient. Comparison Study:  No prior study Performing Technologist: Sherren Kerns RVS  Examination Guidelines: A complete evaluation includes B-mode imaging, spectral Doppler, color Doppler, [name] Bilateral testing is considered an integral part of a complete examination. Limited examinations for reoccurring indications may be performed as noted.  Right Carotid Findings: +----------+--------+--------+--------+------------------+------------------+           PSV cm/sEDV cm/sStenosisPlaque DescriptionComments           +----------+--------+--------+--------+------------------+------------------+ CCA Prox  42      7                                 intimal thickening +----------+--------+--------+--------+------------------+------------------+ CCA Distal47      12                                intimal thickening +----------+--------+--------+--------+------------------+------------------+ ICA Prox  59      19              calcific                             +----------+--------+--------+--------+------------------+------------------+ ICA Mid   60      12                                                   +----------+--------+--------+--------+------------------+------------------+ ICA Distal51      10                                                    +----------+--------+--------+--------+------------------+------------------+  finding. Other: The mastoid air cells are well aerated. IMPRESSION: 1. New hypodensity in the right greater than left aspect of the pons, which may be artifactual. Consider MRI for further evaluation. 2. 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 No acute intracranial hemorrhage. These results will be called to the ordering clinician or representative by the Radiologist Assistant, and communication documented in the PACS or [employer]. Electronically Signed   By: Wiliam Ke M.D.   On: 03/15/2023 19:51   VAS US CAROTID  Result Date: 03/15/2023 Carotid Arterial Duplex Study Patient Name:  Robin [name]  Date of Exam:   03/15/2023 Medical Rec #: [id number]     Accession #:    [id number] Date of Birth: [birth year]      Patient Gender: F Patient Age:   43 years Exam Location:  [facility] Procedure:      VAS US CAROTID Referring Phys: Willia Craze --------------------------------------------------------------------------------  Indications:       CVA and Fall, hip fracture, s/p IM repair. Risk Factors:      Hypertension, hyperlipidemia, Diabetes, coronary artery                    disease. Other Factors:     ESRD, on dialysis. Limitations        Today's exam was limited due to the patient's inability or                    unwillingness to cooperate and the body habitus of the                    patient. Comparison Study:  No prior study Performing Technologist: Sherren Kerns RVS  Examination Guidelines: A complete evaluation includes B-mode imaging, spectral Doppler, color Doppler, [name] Bilateral testing is considered an integral part of a complete examination. Limited examinations for reoccurring indications may be performed as noted.  Right Carotid Findings: +----------+--------+--------+--------+------------------+------------------+           PSV cm/sEDV cm/sStenosisPlaque DescriptionComments           +----------+--------+--------+--------+------------------+------------------+ CCA Prox  42      7                                 intimal thickening +----------+--------+--------+--------+------------------+------------------+ CCA Distal47      12                                intimal thickening +----------+--------+--------+--------+------------------+------------------+ ICA Prox  59      19              calcific                             +----------+--------+--------+--------+------------------+------------------+ ICA Mid   60      12                                                   +----------+--------+--------+--------+------------------+------------------+ ICA Distal51      10                                                    +----------+--------+--------+--------+------------------+------------------+  finding. Other: The mastoid air cells are well aerated. IMPRESSION: 1. New hypodensity in the right greater than left aspect of the pons, which may be artifactual. Consider MRI for further evaluation. 2. 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 No acute intracranial hemorrhage. These results will be called to the ordering clinician or representative by the Radiologist Assistant, and communication documented in the PACS or [employer]. Electronically Signed   By: Wiliam Ke M.D.   On: 03/15/2023 19:51   VAS US CAROTID  Result Date: 03/15/2023 Carotid Arterial Duplex Study Patient Name:  Robin [name]  Date of Exam:   03/15/2023 Medical Rec #: [id number]     Accession #:    [id number] Date of Birth: [birth year]      Patient Gender: F Patient Age:   43 years Exam Location:  [facility] Procedure:      VAS US CAROTID Referring Phys: Willia Craze --------------------------------------------------------------------------------  Indications:       CVA and Fall, hip fracture, s/p IM repair. Risk Factors:      Hypertension, hyperlipidemia, Diabetes, coronary artery                    disease. Other Factors:     ESRD, on dialysis. Limitations        Today's exam was limited due to the patient's inability or                    unwillingness to cooperate and the body habitus of the                    patient. Comparison Study:  No prior study Performing Technologist: Sherren Kerns RVS  Examination Guidelines: A complete evaluation includes B-mode imaging, spectral Doppler, color Doppler, [name] Bilateral testing is considered an integral part of a complete examination. Limited examinations for reoccurring indications may be performed as noted.  Right Carotid Findings: +----------+--------+--------+--------+------------------+------------------+           PSV cm/sEDV cm/sStenosisPlaque DescriptionComments           +----------+--------+--------+--------+------------------+------------------+ CCA Prox  42      7                                 intimal thickening +----------+--------+--------+--------+------------------+------------------+ CCA Distal47      12                                intimal thickening +----------+--------+--------+--------+------------------+------------------+ ICA Prox  59      19              calcific                             +----------+--------+--------+--------+------------------+------------------+ ICA Mid   60      12                                                   +----------+--------+--------+--------+------------------+------------------+ ICA Distal51      10                                                    +----------+--------+--------+--------+------------------+------------------+

## 2023-03-16 NOTE — Progress Notes (Signed)
in the PACS or [employer]. Electronically Signed   By: Robin Ke M.D.   On: 03/15/2023 19:51   ECHOCARDIOGRAM COMPLETE BUBBLE STUDY  Result Date: 03/15/2023    ECHOCARDIOGRAM REPORT   Patient Name:   Robin [name] Date of Exam: 03/15/2023 Medical Rec #:  [id number]    Height:       64.0 in Accession #:    [id number]   Weight:       116.6 lb Date of Birth:  01/18/1951    BSA:          1.556 m Patient Age:    72 years     BP:            206/187 mmHg Patient Gender: F            HR:           96 bpm. Exam Location:  Inpatient Procedure: 2D Echo, Cardiac Doppler and Color Doppler Indications:    stroke  History:        Patient has prior history of Echocardiogram examinations, most                 recent 07/29/2022. CHF, end stage renal disease, Arrythmias:PAC;                 Risk Factors:Diabetes, Dyslipidemia and Current Smoker.  Sonographer:    Robin [name] RDCS Referring Phys: [id number] Robin [name] IMPRESSIONS  1. Left ventricular ejection fraction, by estimation, is 40 to 45%. The left ventricle has mildly decreased function. The left ventricle demonstrates global hypokinesis. There is moderate concentric left ventricular hypertrophy. Left ventricular diastolic parameters are indeterminate.  2. Right ventricular systolic function is mildly reduced. The right ventricular size is not well visualized. There is normal pulmonary artery systolic pressure.  3. The mitral valve is abnormal. Mild mitral valve regurgitation. No evidence of mitral stenosis. The mean mitral valve gradient is 2.0 mmHg. Moderate mitral annular calcification.  4. The aortic valve is tricuspid. Aortic valve regurgitation is not visualized. Aortic valve sclerosis is present, with no evidence of aortic valve stenosis.  5. The inferior vena cava is normal in size with greater than 50% respiratory variability, suggesting right atrial pressure of 3 mmHg.  6. Agitated saline contrast bubble study was negative, with no evidence of any interatrial shunt. FINDINGS  Left Ventricle: Left ventricular ejection fraction, by estimation, is 40 to 45%. The left ventricle has mildly decreased function. The left ventricle demonstrates global hypokinesis. The left ventricular internal cavity size was normal in size. There is  moderate concentric left ventricular hypertrophy. Left ventricular diastolic parameters are indeterminate. Right Ventricle: The right ventricular size is not  well visualized. No increase in right ventricular wall thickness. Right ventricular systolic function is mildly reduced. There is normal pulmonary artery systolic pressure. The tricuspid regurgitant velocity is 2.77 m/s, and with an assumed right atrial pressure of 3 mmHg, the estimated right ventricular systolic pressure is 33.7 mmHg. Left Atrium: Left atrial size was normal in size. Right Atrium: Right atrial size was normal in size. Pericardium: There is no evidence of pericardial effusion. Mitral Valve: The mitral valve is abnormal. Moderate mitral annular calcification. Mild mitral valve regurgitation. No evidence of mitral valve stenosis. The mean mitral valve gradient is 2.0 mmHg with average heart rate of 74 bpm. Tricuspid Valve: The tricuspid valve is normal in structure. Tricuspid valve regurgitation is mild . No evidence of tricuspid stenosis. Aortic Valve: The aortic valve is  19  ALT  --   --   --  <5  ALKPHOS  --   --   --  79  BILITOT  --   --   --  0.7  PROT  --   --   --  5.9*  ALBUMIN 3.1* 3.1* 3.0* 2.4*    CBG: Recent Labs  Lab 03/15/23 0828 03/15/23 1104 03/15/23 2242 03/16/23 0752 03/16/23 1313  GLUCAP 211* 204* 137* 108* 83     No results found for this or any previous visit (from the past 240 hour(s)).       Radiology Studies: VAS US CAROTID  Result Date: 03/16/2023 Carotid Arterial Duplex Study Patient Name:  Robin [name]  Date of Exam:   03/15/2023 Medical Rec #: [id number]     Accession #:    [id number] Date of Birth: [birth year]     Patient Gender: F Patient Age:   50 years Exam Location:  [facility] Procedure:      VAS US CAROTID Referring Phys: Robin [name] --------------------------------------------------------------------------------  Indications:       CVA and Fall, hip fracture, s/p IM repair. Risk Factors:      Hypertension, hyperlipidemia, Diabetes, coronary artery                    disease. Other Factors:     ESRD, on dialysis. Limitations        Today's exam was limited due to the patient's inability or                    unwillingness to cooperate and the body habitus of the                    patient. Comparison Study:  No prior study Performing Technologist: Robin [name] RVS  Examination Guidelines: [name], spectral Doppler, color Doppler, and  power Doppler as needed of all accessible portions of each vessel. Bilateral testing is considered an integral part of [name] Limited examinations for reoccurring indications may be performed as noted.  Right Carotid Findings: +----------+--------+--------+--------+------------------+------------------+           PSV cm/sEDV cm/sStenosisPlaque DescriptionComments           +----------+--------+--------+--------+------------------+------------------+ CCA Prox  42      7                                 intimal thickening +----------+--------+--------+--------+------------------+------------------+ CCA Distal47      12                                intimal thickening +----------+--------+--------+--------+------------------+------------------+ ICA Prox  59      19              calcific                             +----------+--------+--------+--------+------------------+------------------+ ICA Mid   60      12                                                   +----------+--------+--------+--------+------------------+------------------+ ICA Distal51  PROGRESS NOTE    Robin [name]  UJW:[id number] DOB: [birth year] DOA: 03/12/2023 PCP: [facility]  Chief Complaint  Patient presents with   Fall    Brief Narrative:   Robin [name] is Robin [name] 72 y.o. female with medical history significant for ESRD on HD MWF, coronary artery disease status post PCI with stent placement March 2024, HFrEF 45 to 50% (07/29/2022) type 2 diabetes, hypertension, recently admitted by family medicine teaching service at [name] for chest pain, who presents with Calyssa Zobrist fall at home.  The patient reports she lost her balance in her kitchen and fell backwards landing on her left hip and hitting the back of her head on the hardwood floor.  No loss of consciousness.  EMS was activated.  Upon arrival to the emergency room MRI of the brain also showed acute to early subacute ischemic stroke and neurology is following.  X-ray of the left femur showed left intertrochanteric hip fracture. Neurologist and orthopedics on board    Assessment & Plan:   Principal Problem:   Displaced intertrochanteric fracture of left femur, initial encounter for closed fracture [facility]) Active Problems:   Cerebrovascular accident (CVA) due to thrombosis of right cerebellar artery (HCC)  Status Epilepticus  Facial and bilateral upper extremity twitching/tremors: EEG today with evidence of status epilpeticus Appreciate neurology recommendations -> phenobarbital 250, vimpat 150 mg BID, keppra 500 mg BID (additional 500 mg dose after dialysis Mon/Wed/Friday), depakote 1 g q6 hrs.  Continue LTM EEG, consider MRI brain 10/22.    Acute to early subacute infarct in the right brachium pontis Noted on MRI brain 10/17 Echo with EF 40-45%, global hypokinesis, RVSF mildly reduced Carotid US with 1-39% stenosis bilaterally, antegrade bilateral vertebral artery flow  Appreciate neurology recs - likely due to small vessel disease -> resume AC and plavix, atorvastatin LDL 105, A1c 7.7 PT/OT/SLP  Dysphagia Due  to above  Currently NPO, follow for improvement  Atrial Fibrillation with RVR Heparin gtt, metoprolol IV scheduled and prn (for sustained HR >120)  Close left hip fracture status post mechanical fall, POA S/p L intertrochanteric fracture IM rodding 10/19 PT/OT when able to participate  Per ortho, ok to resume home antiplatelets and anticoagulation    Type 2 diabetes with hyperglycemia A1c 7.7  SSI   Coronary artery disease status post PCI with stenting Plavix, heparin gtt   History of thromboembolism DVT, chronic thromboembolic pulmonary hypertension Heparin gtt   ESRD on HD MWF Appreciate renal assistance   Hyperphosphatemia/CKD-MBD: Appreciate renal assistance   Bipolar disorder Continue home regimen.   Hypertension Holding antihypertensives, unable to take PO    HFrEF 45% -Euvolemic on exam -Echocardiogram done in March 2024 revealed estimated EF of 45 to 50%, grade 2 diastolic dysfunction and impaired right ventricular function. -Holding antihypertensives at this time on account of permissive hypertension Strict I's and O's and daily   Paroxysmal atrial fibrillation with RVR: -RVR again -continue prn AV nodal blockers with metop     DVT prophylaxis: heparin gtt Code Status: DNR Family Communication: son Disposition:   Status is: Inpatient Remains inpatient appropriate because: need for continued inpatient care   Consultants:  Ortho Cardiology Neurology Renal   Procedures:   10/19 Left intertrochanteric fracture intramedullary rodding  Antimicrobials:  Anti-infectives (From admission, onward)    Start     Dose/Rate Route Frequency Ordered Stop   03/14/23 1400  ceFAZolin (ANCEF) IVPB 2g/100 mL premix        2 g 200 mL/hr over  PROGRESS NOTE    Robin [name]  UJW:[id number] DOB: [birth year] DOA: 03/12/2023 PCP: [facility]  Chief Complaint  Patient presents with   Fall    Brief Narrative:   Robin [name] is Robin [name] 72 y.o. female with medical history significant for ESRD on HD MWF, coronary artery disease status post PCI with stent placement March 2024, HFrEF 45 to 50% (07/29/2022) type 2 diabetes, hypertension, recently admitted by family medicine teaching service at [name] for chest pain, who presents with Calyssa Zobrist fall at home.  The patient reports she lost her balance in her kitchen and fell backwards landing on her left hip and hitting the back of her head on the hardwood floor.  No loss of consciousness.  EMS was activated.  Upon arrival to the emergency room MRI of the brain also showed acute to early subacute ischemic stroke and neurology is following.  X-ray of the left femur showed left intertrochanteric hip fracture. Neurologist and orthopedics on board    Assessment & Plan:   Principal Problem:   Displaced intertrochanteric fracture of left femur, initial encounter for closed fracture [facility]) Active Problems:   Cerebrovascular accident (CVA) due to thrombosis of right cerebellar artery (HCC)  Status Epilepticus  Facial and bilateral upper extremity twitching/tremors: EEG today with evidence of status epilpeticus Appreciate neurology recommendations -> phenobarbital 250, vimpat 150 mg BID, keppra 500 mg BID (additional 500 mg dose after dialysis Mon/Wed/Friday), depakote 1 g q6 hrs.  Continue LTM EEG, consider MRI brain 10/22.    Acute to early subacute infarct in the right brachium pontis Noted on MRI brain 10/17 Echo with EF 40-45%, global hypokinesis, RVSF mildly reduced Carotid US with 1-39% stenosis bilaterally, antegrade bilateral vertebral artery flow  Appreciate neurology recs - likely due to small vessel disease -> resume AC and plavix, atorvastatin LDL 105, A1c 7.7 PT/OT/SLP  Dysphagia Due  to above  Currently NPO, follow for improvement  Atrial Fibrillation with RVR Heparin gtt, metoprolol IV scheduled and prn (for sustained HR >120)  Close left hip fracture status post mechanical fall, POA S/p L intertrochanteric fracture IM rodding 10/19 PT/OT when able to participate  Per ortho, ok to resume home antiplatelets and anticoagulation    Type 2 diabetes with hyperglycemia A1c 7.7  SSI   Coronary artery disease status post PCI with stenting Plavix, heparin gtt   History of thromboembolism DVT, chronic thromboembolic pulmonary hypertension Heparin gtt   ESRD on HD MWF Appreciate renal assistance   Hyperphosphatemia/CKD-MBD: Appreciate renal assistance   Bipolar disorder Continue home regimen.   Hypertension Holding antihypertensives, unable to take PO    HFrEF 45% -Euvolemic on exam -Echocardiogram done in March 2024 revealed estimated EF of 45 to 50%, grade 2 diastolic dysfunction and impaired right ventricular function. -Holding antihypertensives at this time on account of permissive hypertension Strict I's and O's and daily   Paroxysmal atrial fibrillation with RVR: -RVR again -continue prn AV nodal blockers with metop     DVT prophylaxis: heparin gtt Code Status: DNR Family Communication: son Disposition:   Status is: Inpatient Remains inpatient appropriate because: need for continued inpatient care   Consultants:  Ortho Cardiology Neurology Renal   Procedures:   10/19 Left intertrochanteric fracture intramedullary rodding  Antimicrobials:  Anti-infectives (From admission, onward)    Start     Dose/Rate Route Frequency Ordered Stop   03/14/23 1400  ceFAZolin (ANCEF) IVPB 2g/100 mL premix        2 g 200 mL/hr over  PROGRESS NOTE    Robin [name]  UJW:[id number] DOB: [birth year] DOA: 03/12/2023 PCP: [facility]  Chief Complaint  Patient presents with   Fall    Brief Narrative:   Robin [name] is Robin [name] 72 y.o. female with medical history significant for ESRD on HD MWF, coronary artery disease status post PCI with stent placement March 2024, HFrEF 45 to 50% (07/29/2022) type 2 diabetes, hypertension, recently admitted by family medicine teaching service at [name] for chest pain, who presents with Calyssa Zobrist fall at home.  The patient reports she lost her balance in her kitchen and fell backwards landing on her left hip and hitting the back of her head on the hardwood floor.  No loss of consciousness.  EMS was activated.  Upon arrival to the emergency room MRI of the brain also showed acute to early subacute ischemic stroke and neurology is following.  X-ray of the left femur showed left intertrochanteric hip fracture. Neurologist and orthopedics on board    Assessment & Plan:   Principal Problem:   Displaced intertrochanteric fracture of left femur, initial encounter for closed fracture [facility]) Active Problems:   Cerebrovascular accident (CVA) due to thrombosis of right cerebellar artery (HCC)  Status Epilepticus  Facial and bilateral upper extremity twitching/tremors: EEG today with evidence of status epilpeticus Appreciate neurology recommendations -> phenobarbital 250, vimpat 150 mg BID, keppra 500 mg BID (additional 500 mg dose after dialysis Mon/Wed/Friday), depakote 1 g q6 hrs.  Continue LTM EEG, consider MRI brain 10/22.    Acute to early subacute infarct in the right brachium pontis Noted on MRI brain 10/17 Echo with EF 40-45%, global hypokinesis, RVSF mildly reduced Carotid US with 1-39% stenosis bilaterally, antegrade bilateral vertebral artery flow  Appreciate neurology recs - likely due to small vessel disease -> resume AC and plavix, atorvastatin LDL 105, A1c 7.7 PT/OT/SLP  Dysphagia Due  to above  Currently NPO, follow for improvement  Atrial Fibrillation with RVR Heparin gtt, metoprolol IV scheduled and prn (for sustained HR >120)  Close left hip fracture status post mechanical fall, POA S/p L intertrochanteric fracture IM rodding 10/19 PT/OT when able to participate  Per ortho, ok to resume home antiplatelets and anticoagulation    Type 2 diabetes with hyperglycemia A1c 7.7  SSI   Coronary artery disease status post PCI with stenting Plavix, heparin gtt   History of thromboembolism DVT, chronic thromboembolic pulmonary hypertension Heparin gtt   ESRD on HD MWF Appreciate renal assistance   Hyperphosphatemia/CKD-MBD: Appreciate renal assistance   Bipolar disorder Continue home regimen.   Hypertension Holding antihypertensives, unable to take PO    HFrEF 45% -Euvolemic on exam -Echocardiogram done in March 2024 revealed estimated EF of 45 to 50%, grade 2 diastolic dysfunction and impaired right ventricular function. -Holding antihypertensives at this time on account of permissive hypertension Strict I's and O's and daily   Paroxysmal atrial fibrillation with RVR: -RVR again -continue prn AV nodal blockers with metop     DVT prophylaxis: heparin gtt Code Status: DNR Family Communication: son Disposition:   Status is: Inpatient Remains inpatient appropriate because: need for continued inpatient care   Consultants:  Ortho Cardiology Neurology Renal   Procedures:   10/19 Left intertrochanteric fracture intramedullary rodding  Antimicrobials:  Anti-infectives (From admission, onward)    Start     Dose/Rate Route Frequency Ordered Stop   03/14/23 1400  ceFAZolin (ANCEF) IVPB 2g/100 mL premix        2 g 200 mL/hr over  in the PACS or [employer]. Electronically Signed   By: Robin Ke M.D.   On: 03/15/2023 19:51   ECHOCARDIOGRAM COMPLETE BUBBLE STUDY  Result Date: 03/15/2023    ECHOCARDIOGRAM REPORT   Patient Name:   Robin [name] Date of Exam: 03/15/2023 Medical Rec #:  [id number]    Height:       64.0 in Accession #:    [id number]   Weight:       116.6 lb Date of Birth:  01/18/1951    BSA:          1.556 m Patient Age:    72 years     BP:            206/187 mmHg Patient Gender: F            HR:           96 bpm. Exam Location:  Inpatient Procedure: 2D Echo, Cardiac Doppler and Color Doppler Indications:    stroke  History:        Patient has prior history of Echocardiogram examinations, most                 recent 07/29/2022. CHF, end stage renal disease, Arrythmias:PAC;                 Risk Factors:Diabetes, Dyslipidemia and Current Smoker.  Sonographer:    Robin [name] RDCS Referring Phys: [id number] Robin [name] IMPRESSIONS  1. Left ventricular ejection fraction, by estimation, is 40 to 45%. The left ventricle has mildly decreased function. The left ventricle demonstrates global hypokinesis. There is moderate concentric left ventricular hypertrophy. Left ventricular diastolic parameters are indeterminate.  2. Right ventricular systolic function is mildly reduced. The right ventricular size is not well visualized. There is normal pulmonary artery systolic pressure.  3. The mitral valve is abnormal. Mild mitral valve regurgitation. No evidence of mitral stenosis. The mean mitral valve gradient is 2.0 mmHg. Moderate mitral annular calcification.  4. The aortic valve is tricuspid. Aortic valve regurgitation is not visualized. Aortic valve sclerosis is present, with no evidence of aortic valve stenosis.  5. The inferior vena cava is normal in size with greater than 50% respiratory variability, suggesting right atrial pressure of 3 mmHg.  6. Agitated saline contrast bubble study was negative, with no evidence of any interatrial shunt. FINDINGS  Left Ventricle: Left ventricular ejection fraction, by estimation, is 40 to 45%. The left ventricle has mildly decreased function. The left ventricle demonstrates global hypokinesis. The left ventricular internal cavity size was normal in size. There is  moderate concentric left ventricular hypertrophy. Left ventricular diastolic parameters are indeterminate. Right Ventricle: The right ventricular size is not  well visualized. No increase in right ventricular wall thickness. Right ventricular systolic function is mildly reduced. There is normal pulmonary artery systolic pressure. The tricuspid regurgitant velocity is 2.77 m/s, and with an assumed right atrial pressure of 3 mmHg, the estimated right ventricular systolic pressure is 33.7 mmHg. Left Atrium: Left atrial size was normal in size. Right Atrium: Right atrial size was normal in size. Pericardium: There is no evidence of pericardial effusion. Mitral Valve: The mitral valve is abnormal. Moderate mitral annular calcification. Mild mitral valve regurgitation. No evidence of mitral valve stenosis. The mean mitral valve gradient is 2.0 mmHg with average heart rate of 74 bpm. Tricuspid Valve: The tricuspid valve is normal in structure. Tricuspid valve regurgitation is mild . No evidence of tricuspid stenosis. Aortic Valve: The aortic valve is  19  ALT  --   --   --  <5  ALKPHOS  --   --   --  79  BILITOT  --   --   --  0.7  PROT  --   --   --  5.9*  ALBUMIN 3.1* 3.1* 3.0* 2.4*    CBG: Recent Labs  Lab 03/15/23 0828 03/15/23 1104 03/15/23 2242 03/16/23 0752 03/16/23 1313  GLUCAP 211* 204* 137* 108* 83     No results found for this or any previous visit (from the past 240 hour(s)).       Radiology Studies: VAS US CAROTID  Result Date: 03/16/2023 Carotid Arterial Duplex Study Patient Name:  Robin [name]  Date of Exam:   03/15/2023 Medical Rec #: [id number]     Accession #:    [id number] Date of Birth: [birth year]     Patient Gender: F Patient Age:   50 years Exam Location:  [facility] Procedure:      VAS US CAROTID Referring Phys: Robin [name] --------------------------------------------------------------------------------  Indications:       CVA and Fall, hip fracture, s/p IM repair. Risk Factors:      Hypertension, hyperlipidemia, Diabetes, coronary artery                    disease. Other Factors:     ESRD, on dialysis. Limitations        Today's exam was limited due to the patient's inability or                    unwillingness to cooperate and the body habitus of the                    patient. Comparison Study:  No prior study Performing Technologist: Robin [name] RVS  Examination Guidelines: [name], spectral Doppler, color Doppler, and  power Doppler as needed of all accessible portions of each vessel. Bilateral testing is considered an integral part of [name] Limited examinations for reoccurring indications may be performed as noted.  Right Carotid Findings: +----------+--------+--------+--------+------------------+------------------+           PSV cm/sEDV cm/sStenosisPlaque DescriptionComments           +----------+--------+--------+--------+------------------+------------------+ CCA Prox  42      7                                 intimal thickening +----------+--------+--------+--------+------------------+------------------+ CCA Distal47      12                                intimal thickening +----------+--------+--------+--------+------------------+------------------+ ICA Prox  59      19              calcific                             +----------+--------+--------+--------+------------------+------------------+ ICA Mid   60      12                                                   +----------+--------+--------+--------+------------------+------------------+ ICA Distal51  PROGRESS NOTE    Robin [name]  UJW:[id number] DOB: [birth year] DOA: 03/12/2023 PCP: [facility]  Chief Complaint  Patient presents with   Fall    Brief Narrative:   Robin [name] is Robin [name] 72 y.o. female with medical history significant for ESRD on HD MWF, coronary artery disease status post PCI with stent placement March 2024, HFrEF 45 to 50% (07/29/2022) type 2 diabetes, hypertension, recently admitted by family medicine teaching service at [name] for chest pain, who presents with Calyssa Zobrist fall at home.  The patient reports she lost her balance in her kitchen and fell backwards landing on her left hip and hitting the back of her head on the hardwood floor.  No loss of consciousness.  EMS was activated.  Upon arrival to the emergency room MRI of the brain also showed acute to early subacute ischemic stroke and neurology is following.  X-ray of the left femur showed left intertrochanteric hip fracture. Neurologist and orthopedics on board    Assessment & Plan:   Principal Problem:   Displaced intertrochanteric fracture of left femur, initial encounter for closed fracture [facility]) Active Problems:   Cerebrovascular accident (CVA) due to thrombosis of right cerebellar artery (HCC)  Status Epilepticus  Facial and bilateral upper extremity twitching/tremors: EEG today with evidence of status epilpeticus Appreciate neurology recommendations -> phenobarbital 250, vimpat 150 mg BID, keppra 500 mg BID (additional 500 mg dose after dialysis Mon/Wed/Friday), depakote 1 g q6 hrs.  Continue LTM EEG, consider MRI brain 10/22.    Acute to early subacute infarct in the right brachium pontis Noted on MRI brain 10/17 Echo with EF 40-45%, global hypokinesis, RVSF mildly reduced Carotid US with 1-39% stenosis bilaterally, antegrade bilateral vertebral artery flow  Appreciate neurology recs - likely due to small vessel disease -> resume AC and plavix, atorvastatin LDL 105, A1c 7.7 PT/OT/SLP  Dysphagia Due  to above  Currently NPO, follow for improvement  Atrial Fibrillation with RVR Heparin gtt, metoprolol IV scheduled and prn (for sustained HR >120)  Close left hip fracture status post mechanical fall, POA S/p L intertrochanteric fracture IM rodding 10/19 PT/OT when able to participate  Per ortho, ok to resume home antiplatelets and anticoagulation    Type 2 diabetes with hyperglycemia A1c 7.7  SSI   Coronary artery disease status post PCI with stenting Plavix, heparin gtt   History of thromboembolism DVT, chronic thromboembolic pulmonary hypertension Heparin gtt   ESRD on HD MWF Appreciate renal assistance   Hyperphosphatemia/CKD-MBD: Appreciate renal assistance   Bipolar disorder Continue home regimen.   Hypertension Holding antihypertensives, unable to take PO    HFrEF 45% -Euvolemic on exam -Echocardiogram done in March 2024 revealed estimated EF of 45 to 50%, grade 2 diastolic dysfunction and impaired right ventricular function. -Holding antihypertensives at this time on account of permissive hypertension Strict I's and O's and daily   Paroxysmal atrial fibrillation with RVR: -RVR again -continue prn AV nodal blockers with metop     DVT prophylaxis: heparin gtt Code Status: DNR Family Communication: son Disposition:   Status is: Inpatient Remains inpatient appropriate because: need for continued inpatient care   Consultants:  Ortho Cardiology Neurology Renal   Procedures:   10/19 Left intertrochanteric fracture intramedullary rodding  Antimicrobials:  Anti-infectives (From admission, onward)    Start     Dose/Rate Route Frequency Ordered Stop   03/14/23 1400  ceFAZolin (ANCEF) IVPB 2g/100 mL premix        2 g 200 mL/hr over

## 2023-03-16 NOTE — Progress Notes (Signed)
Subjective: Continues to have intermittent facial twitching bilateral upper extremity tremor-like movements.  Is more awake and tracking me in the room, attempted to follow 1 command (sticking out her tongue) after asking repeatedly.  ROS: Unable to obtain due to poor mental status  Examination  Vital signs in last 24 hours: Temp:  [97.6 F (36.4 C)-98.4 F (36.9 C)] 97.6 F (36.4 C) (10/21 0745) Pulse Rate:  [41-144] 53 (10/21 1200) Resp:  [9-29] 16 (10/21 1208) BP: (100-206)/(55-187) 111/61 (10/21 1208) SpO2:  [94 %-100 %] 100 % (10/21 1208) Weight:  [53.2 kg] 53.2 kg (10/21 0846)  General: lying in bed, getting HD Neuro: Opens eyes spontaneously, did mumble something when asked to her name but I could not understand what she was saying, did follow 1 command (sticking out her tongue) after asking repeatedly, does appear to track me in the room, pupils appear equally round and reactive, increased tone in bilateral upper extremities from but does appear to withdraw to noxious stimuli, withdraws to noxious stimuli in bilateral lower extremities  Basic Metabolic Panel: Recent Labs  Lab 03/13/23 1238 03/13/23 1239 03/13/23 1300 03/14/23 0448 03/15/23 0930 03/16/23 0456  NA 140  --  140 135 133* 136  K 4.4  --  4.4 3.8 5.1 4.0  CL 97*  --  97* 88* 90* 94*  CO2 24  --  25 17* 19* 23  GLUCOSE 96  --  112* 252* 226* 97  BUN 22  --  23 16 36* 44*  CREATININE 6.37*  --  6.66* 4.20* 6.17* 7.38*  CALCIUM 9.9  --  9.5 10.0 9.7 9.5  MG  --   --   --  2.0  --   --   PHOS 6.8* 6.7* 6.7* 7.0* 7.5*  --     CBC: Recent Labs  Lab 03/12/23 1702 03/12/23 1813 03/13/23 1238 03/13/23 1300 03/14/23 0448 03/15/23 0930 03/16/23 0456  WBC 8.4  --  9.1 9.4 12.4* 12.1* 8.4  NEUTROABS 6.8  --   --   --   --   --   --   HGB 10.0*   < > 10.8* 10.4* 12.1 10.1* 8.4*  HCT 31.4*   < > 33.2* 32.2* 37.0 31.7* 26.0*  MCV 96.0  --  94.3 97.0 95.1 95.8 95.9  PLT 178  --  189 180 185 154 155   < > =  values in this interval not displayed.     Coagulation Studies: No results for input(s): "LABPROT", "INR" in the last 72 hours.  Imaging CT head without contrast 03/15/2023: New hypodensity in the right greater than left aspect of the pons, which may be artifactual. Consider MRI for further evaluation. 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 No acute intracranial hemorrhage.   ASSESSMENT AND PLAN: 72 year old female with history of hypertension, diabetes, hyperlipidemia, coronary artery disease, end-stage renal disease on hemodialysis (Monday Wednesday Friday), A-fib on Eliquis who had a fall at home and subsequently had left hip fracture.  While in the hospital was also found to have right cerebellar infarct and has seen by stroke team.  However yesterday patient was noted to have right facial twitching and upper extremity tremor-like movements.  EEG was obtained which showed generalized spikes and polyspikes.  Therefore patient was started on Depakote.  Long-term EEG continued to show status epilepticus.  Vimpat and subsequently Keppra was added.  However, status epilepticus has not resolved.  Status epilepticus, refractory -Reportedly patient has history of PEA arrest  in February 2023.  With the current EEG pattern, it is possible that patient had seizure recurrence due to lowered seizure threshold in the setting of hip fracture and subsequent management  Recommendations -I called and spoke with daughter extensively.  Daughter states at baseline patient is able to participate is conversant, follows all commands, able to participate in basic ADLs and has a caretaker at home.  She also states that patient would not want to be intubated/been vegetative state long-term -I explained that clinically patient appears little more awake today however EEG continues to show status epilepticus.  I also explained that antiseizure medications commonly cause sedation.  Therefore our goal is  to slowly add antiseizure medications without causing excessive sedation to the point where patient is not able to breathe independently.  However, sometimes the additive effect of all the medications can cause significant sedation.  Daughter understands and states she would be willing to slowly add medications and if status epilepticus results then wait for improvement.  However if over the next couple of days status epilepticus does not resolve then we will discuss goals of care again -Also explained to daughter that it is difficult to predict right now if ongoing status epilepticus is causing any permanent neurologic damage.  However I do anticipate that if status epilepticus does not resolve over the next day or 2, then I am concerned that it would suggest increased risk of neurologic injury and permanent neurologic damage. -I discussed starting patient on phenobarbital 250 mg and increasing Vimpat to 150 mg twice daily -Continue Keppra 500 mg twice daily with an additional 500 mg dose after dialysis on Monday Wednesday Friday -Continue Depakote 1000 mg every 6 hours -Continue LTM EEG for now.  Will likely consider unhooking and obtaining MRI brain tomorrow -Continue seizure precautions -Updated Dr. Lowell Guitar about the plan by secure chat  CRITICAL CARE Performed by: [name]   Total critical care time: 45 minutes  Critical care time was exclusive of separately billable procedures and treating other patients.  Critical care was necessary to treat or prevent imminent or life-threatening deterioration.  Critical care was time spent personally by me on the following activities: development of treatment plan with patient and/or surrogate as well as nursing, discussions with consultants, evaluation of patient's response to treatment, examination of patient, obtaining history from patient or surrogate, ordering and performing treatments and interventions, ordering and review of laboratory  studies, ordering and review of radiographic studies, pulse oximetry and re-evaluation of patient's condition.   Lindie Spruce Epilepsy [facility] For questions after 5pm please refer to AMION to reach the Neurologist on call

## 2023-03-16 NOTE — TOC Initial Note (Addendum)
Transition of Care [facility]) - Initial/Assessment Note    Patient Details  Name: Robin [name] MRN: [id number] Date of Birth: [birth year]  [facility] CM/SW Contact:    Delilah Shan, LCSWA Phone Number: 03/16/2023, 4:28 PM  Clinical Narrative:                  CSW received consult for possible SNF placement at time of discharge. Due to patients current orientation CSW spoke with patients daughter [name] regarding PT recommendation of SNF placement at time of discharge.Patients daughter reports she lives with patient.Patients daughter expressed understanding of PT recommendation and is agreeable to SNF placement for patient at time of discharge. Patients daughters top choices for patient is [employer] [facility] [employer].CSW discussed insurance authorization process. Patients daughter gave CSW permission to use patients [employer] Medicare benefits for SNF placement for patient. Patients daughter reports patient receives HD Mon,Wed,Fri.No further questions reported at this time. CSW to continue to follow and assist with discharge planning needs.  Expected Discharge Plan: Skilled Nursing Facility Barriers to Discharge: Continued Medical Work up   Patient Goals and CMS Choice     Choice offered to / list presented to : Adult Children (Patients daughter)      Expected Discharge Plan and Services In-house Referral: Clinical Social Work     Living arrangements for the past 2 months: Single Family Home                                      Prior Living Arrangements/Services Living arrangements for the past 2 months: Single Family Home Lives with::  (daughter lives with patient) Patient language and need for interpreter reviewed:: Yes        Need for Family Participation in Patient Care: Yes (Comment) Care giver support system in place?: Yes (comment)   Criminal Activity/Legal Involvement Pertinent to Current Situation/Hospitalization: No - Comment as needed  Activities  of Daily Living      Permission Sought/Granted Permission sought to share information with : Case Manager, Magazine features editor, Family Supports Permission granted to share information with : No  Share Information with NAME: Due to patients current orientation CSW spoke with patients daughter [name]  Permission granted to share info w AGENCY: SNF  Permission granted to share info w Relationship: daughter  Permission granted to share info w Contact Information: [name] daughter [phone]  Emotional Assessment         Alcohol / Substance Use: Not Applicable Psych Involvement: No (comment)  Admission diagnosis:  Closed fracture of left hip (HCC) [S72.002A] Closed fracture of left hip, initial encounter [facility]) [S72.002A] Patient Active Problem List   Diagnosis Date Noted   Cerebrovascular accident (CVA) due to thrombosis of right cerebellar artery (HCC) 03/13/2023   Displaced intertrochanteric fracture of left femur, initial encounter for closed fracture (HCC) 03/07/2023   QT prolongation 01/17/2023   Nausea & vomiting 01/15/2023   Hidradenitis 11/20/2022   Muscle weakness (generalized) 11/20/2022   Need for assistance with personal care 11/20/2022   Other acute osteomyelitis, right ankle and foot (HCC) 11/20/2022   Transient neurologic deficit 10/20/2022   Electrolyte abnormality 10/19/2022   Adjustment disorder 10/18/2022   Hypotension 10/17/2022   Hyponatremia 10/17/2022   Malnutrition of moderate degree 08/05/2022   NSTEMI (non-ST elevated myocardial infarction) (HCC) 07/29/2022   PAC (premature atrial contraction) 07/29/2022   Hyperlipidemia with target LDL less than 70 07/29/2022  Osteomyelitis, unspecified (HCC) 12/10/2021   Transient altered mental status 07/27/2020   Hypercalcemia 02/01/2020   Major depressive disorder, recurrent, unspecified (HCC) 01/25/2020   COVID-19 01/10/2020   Hypoxia 01/10/2020   History of pulmonary embolism 10/01/2019    Prolonged QT interval 10/01/2019   Hypertensive urgency    Other headache syndrome    Orthostatic dizziness 04/15/2019   Bradycardia 04/15/2019   Syncope    ESRD (end stage renal disease) (HCC)    Iron deficiency anemia, unspecified 10/20/2016   Diabetic Charct's arthropathy (HCC) 10/07/2016   GERD (gastroesophageal reflux disease) 09/09/2016   Depression 09/09/2016   Congestive heart failure (CHF) (HCC) 09/09/2016   Elevated troponin 09/09/2016   Hyperlipidemia associated with type 2 diabetes mellitus (HCC) 03/26/2015   Charcot ankle 03/16/2015   Anemia, chronic renal failure    NSVT (nonsustained ventricular tachycardia) (HCC)    Hypertension due to end stage renal disease caused by type 2 diabetes mellitus, on dialysis (HCC) 12/09/2014   Bipolar affective disorder (HCC) 12/09/2014   Diabetes mellitus type 2, insulin dependent (HCC) 11/17/2014   CAD (coronary artery disease), native coronary artery with 2 stents  11/17/2014   PCP:  [name]:   [id number] - [place], [place] - [address] [place] [address] Phone: [phone] Fax: [phone]  [facility] [place], [place] - [address] [place] [address] Phone: [phone] Fax: [phone]  [facility] [address] [place] [address] Phone: [phone] Fax: [phone]     Social Determinants of Health (SDOH) Social History: SDOH Screenings   Food Insecurity: No Food Insecurity (01/15/2023)  Housing: Patient Declined (01/15/2023)  Transportation Needs: No Transportation Needs (01/15/2023)  Utilities: Not At Risk (01/15/2023)  Tobacco Use: High Risk (03/24/2023)   SDOH Interventions:     Readmission Risk Interventions     No data to display

## 2023-03-16 NOTE — Plan of Care (Signed)
  Problem: Nutritional: Goal: Maintenance of adequate nutrition will improve Outcome: Not Progressing

## 2023-03-16 NOTE — Procedures (Signed)
HD Note:  Some information was entered later than the data was gathered due to patient care needs. The stated time with the data is accurate.  Received patient in bed to unit.  Patient connected to remote monitoring EEG.  Patient non responding.  EEG technician in to set up the monitor.     Informed consent signed and in chart.   Access used: Upper right chest HD catheter Access issues: Lines required reversal, arterial line pressures were too high to perform treatment.  After reversal, no issues  Patient rhythm changed to Afib at 1050.  Heart rate irregular by auscultation.  Patient  constantly having tremors.  Dr. Melynda Ripple came to the bedside to evaluate. Rapid response was called.  See notes of Fritzi Mandes, RN Patient treatment was stopped per orders from Dr. Romelle Starcher to end if pulse stayed consistently above 130.  TX duration: 1.88 hours  Alert, without acute distress.  Total UF removed: 400 ml  Hand-off given to patient's nurse.   Transported back to the room   Bishoy Cupp L. Dareen Piano, RN Kidney Dialysis Unit.

## 2023-03-16 NOTE — Progress Notes (Signed)
Paged 2x EEG TECH at [phone] to come take the patient machine to dailysis. I did not get a response. Will try again

## 2023-03-16 NOTE — Evaluation (Signed)
Occupational Therapy Evaluation Patient Details Name: Robin [name] MRN: [id number] DOB: 05-16-1951 Today's Date: 03/16/2023   History of Present Illness 72 yo female presenting after a fall at home resulting in L intertrochanteric hip fx. MRI brain showed acute to early subacute infarct in R brachium pontis. Underwent IM nailing on 10/19. Hospitalization complicated by a fib/tachycardia and seizure activity with initiation of continuous EEG. PMHx:  CAD, PCI, ESRD on HD MWF, dementia, bipolar disorder, a-fib, CHF, COPD, DM, HTN, HLD, DVT, R LE amputation (at least transmet).   Clinical Impression   PTA, pt lives with family, typically Modified Independent with ADLs and uses walker vs wheelchair for mobility. Per son, pt typically slower to respond at baseline. Pt presents now with deficits in cognition, L hip pain, sitting balance, strength and endurance. Pt responds more easily to son's voice than staff during session w/ inconsistent command following. Overall, pt requires Total A x 2 for bed mobility and variable levels of physical assist for sitting balance. Pt requires Max-Total A for ADLs primarily due to cognition today. Pt's son would prefer for pt to DC home w/ hope of increased PCA hours. Will continue to follow acutely in hopes to progress safely home with family assist.      If plan is discharge home, recommend the following: A lot of help with walking and/or transfers;Two people to help with walking and/or transfers;A lot of help with bathing/dressing/bathroom;Two people to help with bathing/dressing/bathroom    Functional Status Assessment  Patient has had a recent decline in their functional status and demonstrates the ability to make significant improvements in function in a reasonable and predictable amount of time.  Equipment Recommendations  Hospital bed;BSC/3in1 (TBD pending progress)    Recommendations for Other Services       Precautions / Restrictions  Precautions Precautions: Fall;Other (comment) Precaution Comments: EEG, seizures Restrictions Weight Bearing Restrictions: Yes LLE Weight Bearing: Weight bearing as tolerated      Mobility Bed Mobility Overal bed mobility: Needs Assistance Bed Mobility: Supine to Sit, Sit to Supine     Supine to sit: Total assist, +2 for physical assistance, +2 for safety/equipment, HOB elevated Sit to supine: Total assist, +2 for physical assistance, +2 for safety/equipment   General bed mobility comments: very minimal initiation though did attempt to lift and lower trunk with task. overall, Total A x 2 for all bed mobility attempts    Transfers                          Balance Overall balance assessment: Needs assistance Sitting-balance support: Feet supported, Bilateral upper extremity supported Sitting balance-Leahy Scale: Poor Sitting balance - Comments: best sitting balance at Min A but up to Mod-Max A at times (likely attempting to offload painful L hip?) Postural control: Posterior lean, Right lateral lean                                 ADL either performed or assessed with clinical judgement   ADL Overall ADL's : Needs assistance/impaired     Grooming: Moderate assistance;Bed level;Sitting   Upper Body Bathing: Maximal assistance;Bed level;Sitting   Lower Body Bathing: Total assistance;Bed level   Upper Body Dressing : Maximal assistance;Sitting   Lower Body Dressing: Total assistance;Bed level       Toileting- Clothing Manipulation and Hygiene: Total assistance;Bed level  Vision Baseline Vision/History: 2 Legally blind Ability to See in Adequate Light: 2 Moderately impaired Patient Visual Report: No change from baseline Vision Assessment?: Vision impaired- to be further tested in functional context Additional Comments: per son, pt legally blind in both eyes     Perception         Praxis         Pertinent Vitals/Pain  Pain Assessment Pain Assessment: Faces Faces Pain Scale: Hurts little more Pain Location: L hip Pain Descriptors / Indicators: Grimacing Pain Intervention(s): Monitored during session, Limited activity within patient's tolerance     Extremity/Trunk Assessment Upper Extremity Assessment Upper Extremity Assessment: Generalized weakness;Right hand dominant   Lower Extremity Assessment Lower Extremity Assessment: Defer to PT evaluation   Cervical / Trunk Assessment Cervical / Trunk Assessment: Kyphotic   Communication Communication Communication: Difficulty following commands/understanding;Difficulty communicating thoughts/reduced clarity of speech Following commands: Follows one step commands inconsistently;Follows one step commands with increased time Cueing Techniques: Verbal cues;Gestural cues;Tactile cues;Visual cues   Cognition Arousal: Alert Behavior During Therapy: Flat affect Overall Cognitive Status: Difficult to assess                                 General Comments: per son present, pt typically conversant and responds though slow to respond at baseline. Pt with very inconsistent command following but was noted with some improvements in responding to son. some initiation noted with bed mobility though inconsistent when cued     General Comments  Son at bedside, providing background info    Exercises     Shoulder Instructions      Home Living Family/patient expects to be discharged to:: Private residence Living Arrangements: Children;Other relatives (grandson) Available Help at Discharge: Family;Available PRN/intermittently;Personal care attendant Type of Home: House Home Access: Ramped entrance   Entrance Stairs-Rails: Right Home Layout: One level     Bathroom Shower/Tub: Tub/shower unit;Walk-in shower   Bathroom Toilet: Standard (BSC over it) Bathroom Accessibility: Yes   Home Equipment: Agricultural consultant (2 wheels);Shower  seat;BSC/3in1;Wheelchair - manual   Additional Comments: no one stays overnight typically. aide 3 hours in the morning and in the afternoon on weekdays      Prior Functioning/Environment Prior Level of Function : History of Falls (last six months);Independent/Modified Independent             Mobility Comments: use of RW vs wheelchair to mobilize in home ADLs Comments: Mod I with ADLs, can assist with some IADLs but family mostly handles. typically slow to respond with communication at baseline        OT Problem List: Decreased strength;Decreased activity tolerance;Impaired balance (sitting and/or standing);Decreased cognition;Decreased knowledge of use of DME or AE;Decreased safety awareness;Pain      OT Treatment/Interventions: Self-care/ADL training;Therapeutic exercise;Energy conservation;DME and/or AE instruction;Therapeutic activities;Balance training;Patient/family education    OT Goals(Current goals can be found in the care plan section) Acute Rehab OT Goals Patient Stated Goal: take pt home, avoid nursing facility OT Goal Formulation: With family Time For Goal Achievement: 03/30/23 Potential to Achieve Goals: Good ADL Goals Pt Will Perform Lower Body Bathing: with mod assist;sitting/lateral leans;sit to/from stand Pt Will Perform Lower Body Dressing: with mod assist;sit to/from stand;sitting/lateral leans Pt Will Transfer to Toilet: with mod assist;stand pivot transfer;bedside commode  OT Frequency: Min 1X/week    Co-evaluation PT/OT/SLP Co-Evaluation/Treatment: Yes Reason for Co-Treatment: Necessary to address cognition/behavior during functional activity;For patient/therapist safety;To address functional/ADL transfers   OT  goals addressed during session: Strengthening/ROM;ADL's and self-care      AM-PAC OT "6 Clicks" Daily Activity     Outcome Measure Help from another person eating meals?: A Lot Help from another person taking care of personal grooming?: A  Lot Help from another person toileting, which includes using toliet, bedpan, or urinal?: Total Help from another person bathing (including washing, rinsing, drying)?: A Lot Help from another person to put on and taking off regular upper body clothing?: A Lot Help from another person to put on and taking off regular lower body clothing?: Total 6 Click Score: 10   End of Session    Activity Tolerance: Patient limited by fatigue Patient left: in bed;with call bell/phone within reach;with bed alarm set;with family/visitor present  OT Visit Diagnosis: Unsteadiness on feet (R26.81);Other abnormalities of gait and mobility (R26.89);Muscle weakness (generalized) (M62.81)                Time: 2229-7989 OT Time Calculation (min): 22 min Charges:  OT General Charges $OT Visit: 1 Visit OT Evaluation $OT Eval Moderate Complexity: 1 Mod  Bradd Canary, OTR/L Acute Rehab Services Office: [phone]   Lorre Munroe 03/16/2023, 2:59 PM

## 2023-03-16 NOTE — Progress Notes (Signed)
Pt receives out-pt HD at [name] [place] on MWF with 5:40 am chair time. This info was provided to CSW for d/c planning purposes. Will assist as needed.   Olivia Canter Renal Navigator [phone]

## 2023-03-16 NOTE — Progress Notes (Signed)
[id number] Date of Birth: 1951/05/04     Patient Gender: F Patient Age:   72 years Exam Location:  [name] Procedure:      VAS US CAROTID Referring Phys: Willia Craze --------------------------------------------------------------------------------  Indications:       CVA and Fall, hip fracture, s/p IM repair. Risk Factors:      Hypertension, hyperlipidemia, Diabetes, coronary artery                    disease. Other Factors:     ESRD, on dialysis. Limitations        Today's exam was limited due to the patient's inability or                    unwillingness to cooperate and the body habitus of the                    patient. Comparison Study:  No prior study Performing Technologist: Sherren Kerns RVS  Examination Guidelines: A complete evaluation includes B-mode imaging, spectral Doppler, color Doppler, [name] Bilateral testing is considered an integral part of a complete examination. Limited examinations for reoccurring indications may be performed as noted.  Right Carotid Findings: +----------+--------+--------+--------+------------------+------------------+           PSV cm/sEDV cm/sStenosisPlaque DescriptionComments           +----------+--------+--------+--------+------------------+------------------+ CCA Prox  42      7                                 intimal thickening +----------+--------+--------+--------+------------------+------------------+ CCA Distal47      12                                intimal thickening +----------+--------+--------+--------+------------------+------------------+ ICA Prox  59       19              calcific                             +----------+--------+--------+--------+------------------+------------------+ ICA Mid   60      12                                                   +----------+--------+--------+--------+------------------+------------------+ ICA Distal51      10                                                   +----------+--------+--------+--------+------------------+------------------+ ECA       45      4                                                    +----------+--------+--------+--------+------------------+------------------+ +----------+--------+-------+--------+-------------------+           PSV cm/sEDV cmsDescribeArm Pressure (mmHG) +----------+--------+-------+--------+-------------------+ [id number]                                         +----------+--------+-------+--------+-------------------+ +---------+--------+--+--------+--+  Cardiology Progress Note  [name] MRN: [id number] DOB: 05-09-1951 Date of Encounter: 03/16/2023  Primary Cardiologist: Nicki Guadalajara, MD  Subjective   Chief Complaint: None.   HPI: Seizures overnight.  Drowsy.  Not interactive this morning.  ROS:  All other ROS reviewed and negative. Pertinent positives noted in the HPI.     Inpatient Medications  Scheduled Meds:  atorvastatin  40 mg Oral Daily   Chlorhexidine Gluconate Cloth  6 each Topical Q0600   clopidogrel  75 mg Oral Daily   doxercalciferol  5 mcg Intravenous Q M,W,F-HD   insulin aspart  0-5 Units Subcutaneous QHS   insulin aspart  0-6 Units Subcutaneous TID WC   metoprolol tartrate  2.5 mg Intravenous Q6H   multivitamin  1 tablet Oral QHS   pantoprazole  40 mg Oral QHS   sevelamer carbonate  1,600 mg Oral TID WC   umeclidinium bromide  1 puff Inhalation Daily   Continuous Infusions:  anticoagulant sodium citrate     lacosamide (VIMPAT) IV 100 mg (03/16/23 0423)   tranexamic acid     valproate sodium 1,000 mg (03/16/23 0514)   PRN Meds: acetaminophen, albuterol, alteplase, anticoagulant sodium citrate, heparin, labetalol, melatonin, oxyCODONE, polyethylene glycol, prochlorperazine   Vital Signs   Vitals:   03/15/23 2343 03/16/23 0401 03/16/23 0745 03/16/23 0846  BP:  (!) 100/59 113/78 117/67  Pulse:   72 73  Resp:   (!) 9 (!) 21  Temp: 98 F (36.7 C) 97.6 F (36.4 C) 97.6 F (36.4 C)   TempSrc: Axillary Oral Oral   SpO2:   100% 94%  Weight:    53.2 kg  Height:        Intake/Output Summary (Last 24 hours) at 03/16/2023 0907 Last data filed at 03/16/2023 0200 Gross per 24 hour  Intake 390 ml  Output --  Net 390 ml      03/16/2023    8:46 AM 03/14/2023    7:13 AM 03/13/2023    5:31 PM  Last 3 Weights  Weight (lbs) 117 lb 4.6 oz 116 lb 10 oz 116 lb 10 oz  Weight (kg) 53.2 kg 52.9 kg 52.9 kg      Telemetry  Overnight telemetry shows sinus rhythm 60s, which I personally  reviewed.   ECG  The most recent ECG shows atrial fibrillation heart rate 157, PVC noted, which I personally reviewed.   Physical Exam   Vitals:   03/15/23 2343 03/16/23 0401 03/16/23 0745 03/16/23 0846  BP:  (!) 100/59 113/78 117/67  Pulse:   72 73  Resp:   (!) 9 (!) 21  Temp: 98 F (36.7 C) 97.6 F (36.4 C) 97.6 F (36.4 C)   TempSrc: Axillary Oral Oral   SpO2:   100% 94%  Weight:    53.2 kg  Height:        Intake/Output Summary (Last 24 hours) at 03/16/2023 0907 Last data filed at 03/16/2023 0200 Gross per 24 hour  Intake 390 ml  Output --  Net 390 ml       03/16/2023    8:46 AM 03/14/2023    7:13 AM 03/13/2023    5:31 PM  Last 3 Weights  Weight (lbs) 117 lb 4.6 oz 116 lb 10 oz 116 lb 10 oz  Weight (kg) 53.2 kg 52.9 kg 52.9 kg    Body mass index is 20.12 kg/m.  General: Drowsy, ill-appearing Head: Atraumatic, normal size  Eyes: PEERLA, EOMI  Neck: Supple, no JVD  VertebralPSV cm/s57EDV cm/s12 +---------+--------+--+--------+--+  Left Carotid Findings: +----------+--------+--------+--------+------------------+------------------+           PSV cm/sEDV cm/sStenosisPlaque DescriptionComments           +----------+--------+--------+--------+------------------+------------------+ CCA Prox  39      12                                intimal thickening +----------+--------+--------+--------+------------------+------------------+ CCA Distal52      14                                intimal thickening +----------+--------+--------+--------+------------------+------------------+ ICA Prox  83      21              heterogenous                         +----------+--------+--------+--------+------------------+------------------+ ICA Mid   78      21                                                   +----------+--------+--------+--------+------------------+------------------+ ICA Distal56      13                                                    +----------+--------+--------+--------+------------------+------------------+ ECA       76      3                                                    +----------+--------+--------+--------+------------------+------------------+ +----------+--------+--------+--------+-------------------+           PSV cm/sEDV cm/sDescribeArm Pressure (mmHG) +----------+--------+--------+--------+-------------------+ [id number]                                         +----------+--------+--------+--------+-------------------+ +---------+--------+--+--------+-+ VertebralPSV cm/s52EDV cm/s1 +---------+--------+--+--------+-+   Summary: Right Carotid: Velocities in the right ICA are consistent with a 1-39% stenosis. Left Carotid: Velocities in the left ICA are consistent with a 1-39% stenosis. Vertebrals:  Bilateral vertebral arteries demonstrate antegrade flow. Subclavians: Normal flow hemodynamics were seen in bilateral subclavian              arteries. *See table(s) above for measurements and observations.     Preliminary    ECHOCARDIOGRAM COMPLETE BUBBLE STUDY  Result Date: 03/15/2023    ECHOCARDIOGRAM REPORT   Patient Name:   Robin [name] Date of Exam: 03/15/2023 Medical Rec #:  [id number]    Height:       64.0 in Accession #:    [id number]   Weight:       116.6 lb Date of Birth:  1950-08-23    BSA:          1.556 m Patient Age:    72 years     BP:           206/187 mmHg  Cardiology Progress Note  [name] MRN: [id number] DOB: 05-09-1951 Date of Encounter: 03/16/2023  Primary Cardiologist: Nicki Guadalajara, MD  Subjective   Chief Complaint: None.   HPI: Seizures overnight.  Drowsy.  Not interactive this morning.  ROS:  All other ROS reviewed and negative. Pertinent positives noted in the HPI.     Inpatient Medications  Scheduled Meds:  atorvastatin  40 mg Oral Daily   Chlorhexidine Gluconate Cloth  6 each Topical Q0600   clopidogrel  75 mg Oral Daily   doxercalciferol  5 mcg Intravenous Q M,W,F-HD   insulin aspart  0-5 Units Subcutaneous QHS   insulin aspart  0-6 Units Subcutaneous TID WC   metoprolol tartrate  2.5 mg Intravenous Q6H   multivitamin  1 tablet Oral QHS   pantoprazole  40 mg Oral QHS   sevelamer carbonate  1,600 mg Oral TID WC   umeclidinium bromide  1 puff Inhalation Daily   Continuous Infusions:  anticoagulant sodium citrate     lacosamide (VIMPAT) IV 100 mg (03/16/23 0423)   tranexamic acid     valproate sodium 1,000 mg (03/16/23 0514)   PRN Meds: acetaminophen, albuterol, alteplase, anticoagulant sodium citrate, heparin, labetalol, melatonin, oxyCODONE, polyethylene glycol, prochlorperazine   Vital Signs   Vitals:   03/15/23 2343 03/16/23 0401 03/16/23 0745 03/16/23 0846  BP:  (!) 100/59 113/78 117/67  Pulse:   72 73  Resp:   (!) 9 (!) 21  Temp: 98 F (36.7 C) 97.6 F (36.4 C) 97.6 F (36.4 C)   TempSrc: Axillary Oral Oral   SpO2:   100% 94%  Weight:    53.2 kg  Height:        Intake/Output Summary (Last 24 hours) at 03/16/2023 0907 Last data filed at 03/16/2023 0200 Gross per 24 hour  Intake 390 ml  Output --  Net 390 ml      03/16/2023    8:46 AM 03/14/2023    7:13 AM 03/13/2023    5:31 PM  Last 3 Weights  Weight (lbs) 117 lb 4.6 oz 116 lb 10 oz 116 lb 10 oz  Weight (kg) 53.2 kg 52.9 kg 52.9 kg      Telemetry  Overnight telemetry shows sinus rhythm 60s, which I personally  reviewed.   ECG  The most recent ECG shows atrial fibrillation heart rate 157, PVC noted, which I personally reviewed.   Physical Exam   Vitals:   03/15/23 2343 03/16/23 0401 03/16/23 0745 03/16/23 0846  BP:  (!) 100/59 113/78 117/67  Pulse:   72 73  Resp:   (!) 9 (!) 21  Temp: 98 F (36.7 C) 97.6 F (36.4 C) 97.6 F (36.4 C)   TempSrc: Axillary Oral Oral   SpO2:   100% 94%  Weight:    53.2 kg  Height:        Intake/Output Summary (Last 24 hours) at 03/16/2023 0907 Last data filed at 03/16/2023 0200 Gross per 24 hour  Intake 390 ml  Output --  Net 390 ml       03/16/2023    8:46 AM 03/14/2023    7:13 AM 03/13/2023    5:31 PM  Last 3 Weights  Weight (lbs) 117 lb 4.6 oz 116 lb 10 oz 116 lb 10 oz  Weight (kg) 53.2 kg 52.9 kg 52.9 kg    Body mass index is 20.12 kg/m.  General: Drowsy, ill-appearing Head: Atraumatic, normal size  Eyes: PEERLA, EOMI  Neck: Supple, no JVD  VertebralPSV cm/s57EDV cm/s12 +---------+--------+--+--------+--+  Left Carotid Findings: +----------+--------+--------+--------+------------------+------------------+           PSV cm/sEDV cm/sStenosisPlaque DescriptionComments           +----------+--------+--------+--------+------------------+------------------+ CCA Prox  39      12                                intimal thickening +----------+--------+--------+--------+------------------+------------------+ CCA Distal52      14                                intimal thickening +----------+--------+--------+--------+------------------+------------------+ ICA Prox  83      21              heterogenous                         +----------+--------+--------+--------+------------------+------------------+ ICA Mid   78      21                                                   +----------+--------+--------+--------+------------------+------------------+ ICA Distal56      13                                                    +----------+--------+--------+--------+------------------+------------------+ ECA       76      3                                                    +----------+--------+--------+--------+------------------+------------------+ +----------+--------+--------+--------+-------------------+           PSV cm/sEDV cm/sDescribeArm Pressure (mmHG) +----------+--------+--------+--------+-------------------+ [id number]                                         +----------+--------+--------+--------+-------------------+ +---------+--------+--+--------+-+ VertebralPSV cm/s52EDV cm/s1 +---------+--------+--+--------+-+   Summary: Right Carotid: Velocities in the right ICA are consistent with a 1-39% stenosis. Left Carotid: Velocities in the left ICA are consistent with a 1-39% stenosis. Vertebrals:  Bilateral vertebral arteries demonstrate antegrade flow. Subclavians: Normal flow hemodynamics were seen in bilateral subclavian              arteries. *See table(s) above for measurements and observations.     Preliminary    ECHOCARDIOGRAM COMPLETE BUBBLE STUDY  Result Date: 03/15/2023    ECHOCARDIOGRAM REPORT   Patient Name:   Robin [name] Date of Exam: 03/15/2023 Medical Rec #:  [id number]    Height:       64.0 in Accession #:    [id number]   Weight:       116.6 lb Date of Birth:  1950-08-23    BSA:          1.556 m Patient Age:    72 years     BP:           206/187 mmHg  Cardiology Progress Note  [name] MRN: [id number] DOB: 05-09-1951 Date of Encounter: 03/16/2023  Primary Cardiologist: Nicki Guadalajara, MD  Subjective   Chief Complaint: None.   HPI: Seizures overnight.  Drowsy.  Not interactive this morning.  ROS:  All other ROS reviewed and negative. Pertinent positives noted in the HPI.     Inpatient Medications  Scheduled Meds:  atorvastatin  40 mg Oral Daily   Chlorhexidine Gluconate Cloth  6 each Topical Q0600   clopidogrel  75 mg Oral Daily   doxercalciferol  5 mcg Intravenous Q M,W,F-HD   insulin aspart  0-5 Units Subcutaneous QHS   insulin aspart  0-6 Units Subcutaneous TID WC   metoprolol tartrate  2.5 mg Intravenous Q6H   multivitamin  1 tablet Oral QHS   pantoprazole  40 mg Oral QHS   sevelamer carbonate  1,600 mg Oral TID WC   umeclidinium bromide  1 puff Inhalation Daily   Continuous Infusions:  anticoagulant sodium citrate     lacosamide (VIMPAT) IV 100 mg (03/16/23 0423)   tranexamic acid     valproate sodium 1,000 mg (03/16/23 0514)   PRN Meds: acetaminophen, albuterol, alteplase, anticoagulant sodium citrate, heparin, labetalol, melatonin, oxyCODONE, polyethylene glycol, prochlorperazine   Vital Signs   Vitals:   03/15/23 2343 03/16/23 0401 03/16/23 0745 03/16/23 0846  BP:  (!) 100/59 113/78 117/67  Pulse:   72 73  Resp:   (!) 9 (!) 21  Temp: 98 F (36.7 C) 97.6 F (36.4 C) 97.6 F (36.4 C)   TempSrc: Axillary Oral Oral   SpO2:   100% 94%  Weight:    53.2 kg  Height:        Intake/Output Summary (Last 24 hours) at 03/16/2023 0907 Last data filed at 03/16/2023 0200 Gross per 24 hour  Intake 390 ml  Output --  Net 390 ml      03/16/2023    8:46 AM 03/14/2023    7:13 AM 03/13/2023    5:31 PM  Last 3 Weights  Weight (lbs) 117 lb 4.6 oz 116 lb 10 oz 116 lb 10 oz  Weight (kg) 53.2 kg 52.9 kg 52.9 kg      Telemetry  Overnight telemetry shows sinus rhythm 60s, which I personally  reviewed.   ECG  The most recent ECG shows atrial fibrillation heart rate 157, PVC noted, which I personally reviewed.   Physical Exam   Vitals:   03/15/23 2343 03/16/23 0401 03/16/23 0745 03/16/23 0846  BP:  (!) 100/59 113/78 117/67  Pulse:   72 73  Resp:   (!) 9 (!) 21  Temp: 98 F (36.7 C) 97.6 F (36.4 C) 97.6 F (36.4 C)   TempSrc: Axillary Oral Oral   SpO2:   100% 94%  Weight:    53.2 kg  Height:        Intake/Output Summary (Last 24 hours) at 03/16/2023 0907 Last data filed at 03/16/2023 0200 Gross per 24 hour  Intake 390 ml  Output --  Net 390 ml       03/16/2023    8:46 AM 03/14/2023    7:13 AM 03/13/2023    5:31 PM  Last 3 Weights  Weight (lbs) 117 lb 4.6 oz 116 lb 10 oz 116 lb 10 oz  Weight (kg) 53.2 kg 52.9 kg 52.9 kg    Body mass index is 20.12 kg/m.  General: Drowsy, ill-appearing Head: Atraumatic, normal size  Eyes: PEERLA, EOMI  Neck: Supple, no JVD  [id number] Date of Birth: 1951/05/04     Patient Gender: F Patient Age:   72 years Exam Location:  [name] Procedure:      VAS US CAROTID Referring Phys: Willia Craze --------------------------------------------------------------------------------  Indications:       CVA and Fall, hip fracture, s/p IM repair. Risk Factors:      Hypertension, hyperlipidemia, Diabetes, coronary artery                    disease. Other Factors:     ESRD, on dialysis. Limitations        Today's exam was limited due to the patient's inability or                    unwillingness to cooperate and the body habitus of the                    patient. Comparison Study:  No prior study Performing Technologist: Sherren Kerns RVS  Examination Guidelines: A complete evaluation includes B-mode imaging, spectral Doppler, color Doppler, [name] Bilateral testing is considered an integral part of a complete examination. Limited examinations for reoccurring indications may be performed as noted.  Right Carotid Findings: +----------+--------+--------+--------+------------------+------------------+           PSV cm/sEDV cm/sStenosisPlaque DescriptionComments           +----------+--------+--------+--------+------------------+------------------+ CCA Prox  42      7                                 intimal thickening +----------+--------+--------+--------+------------------+------------------+ CCA Distal47      12                                intimal thickening +----------+--------+--------+--------+------------------+------------------+ ICA Prox  59       19              calcific                             +----------+--------+--------+--------+------------------+------------------+ ICA Mid   60      12                                                   +----------+--------+--------+--------+------------------+------------------+ ICA Distal51      10                                                   +----------+--------+--------+--------+------------------+------------------+ ECA       45      4                                                    +----------+--------+--------+--------+------------------+------------------+ +----------+--------+-------+--------+-------------------+           PSV cm/sEDV cmsDescribeArm Pressure (mmHG) +----------+--------+-------+--------+-------------------+ [id number]                                         +----------+--------+-------+--------+-------------------+ +---------+--------+--+--------+--+

## 2023-03-16 NOTE — Significant Event (Signed)
Rapid Response Event Note   Reason for Call :  "Heart rate change", placed EKG orders until my arrival, call back for further needs  Initial Focused Assessment:  Patient in bed with eyes closed, open eyes to voice, will track, minimally interactive. Skin warm, dry, some edema. Lungs clear/diminished; heart tones irregular. Rounded on patient this AM approximately 1000 in HD and noted facial twitching around mouth, right arm and right leg involuntary movements. EEG event button pressed. Patient tracking and able to follow minimal commands at that time.   157/118 (129) HR 120s-150s RR 28 O2 100% 1L  Interventions:  EKG: afib RVR HD RN ended tx: per MD to return if HR sustain >130. Able to take off .  MD Lowell Guitar notified, to bedside 5mg  IV metoprolol x1  Plan of Care:  PRN 5mg  IV metoprolol ordered. MD to discuss GOC with family.   Event Summary:  MD Notified: C. Lowell Guitar MD Call Time: 1056 Arrival Time: 1128 End Time: 1205  Truddie Crumble, RN

## 2023-03-16 NOTE — Evaluation (Signed)
Physical Therapy Evaluation Patient Details Name: Robin [name] MRN: [id number] DOB: 1950-09-30 Today's Date: 03/16/2023  History of Present Illness  72 yo female presenting after a fall at home resulting in L intertrochanteric hip fx. MRI brain showed acute to early subacute infarct in R brachium pontis. Underwent IM nailing on 29-Mar-2023. Hospitalization complicated by a fib/tachycardia and seizure activity with initiation of continuous EEG. PMHx:  CAD, PCI, ESRD on HD MWF, dementia, bipolar disorder, a-fib, CHF, COPD, DM, HTN, HLD, DVT, R LE amputation (at least transmet).  Clinical Impression  Pt is presenting below baseline level of functioning. Prior to hospitalization pt was able to mobilize with RW and W/C with some assistance with ADL's. Currently pt is total assist for bed mobility; pt will initiate movement with request from son but not staff. Due to pt current functional status, home set up and available assistance at home recommending skilled physical therapy services < 3 hours/day on discharge from acute care hospital setting in order to decrease risk for falls, injury and re-hospitalization.. If pt family would like to take pt home will require hospital bed and hoyer lift with 24/7 physical assistance to prevent immobility and skin break down. Pt fatigued quickly during session.        If plan is discharge home, recommend the following: Two people to help with walking and/or transfers;Assist for transportation;Assistance with cooking/housework;Help with stairs or ramp for entrance   Can travel by private vehicle   No    Equipment Recommendations Wheelchair cushion (measurements PT);Wheelchair (measurements PT);Hoyer lift;Hospital bed     Functional Status Assessment Patient has had a recent decline in their functional status and demonstrates the ability to make significant improvements in function in a reasonable and predictable amount of time.     Precautions / Restrictions  Precautions Precautions: Fall;Other (comment) Precaution Comments: EEG, seizures Restrictions Weight Bearing Restrictions: Yes LLE Weight Bearing: Weight bearing as tolerated      Mobility  Bed Mobility Overal bed mobility: Needs Assistance Bed Mobility: Supine to Sit, Sit to Supine     Supine to sit: Total assist, +2 for physical assistance, +2 for safety/equipment, HOB elevated Sit to supine: Total assist, +2 for physical assistance, +2 for safety/equipment   General bed mobility comments: very minimal initiation though did attempt to lift and lower trunk with task. overall, Total A x 2 for all bed mobility attempts        Balance Overall balance assessment: Needs assistance Sitting-balance support: Feet supported, Bilateral upper extremity supported Sitting balance-Leahy Scale: Poor Sitting balance - Comments: best sitting balance at Min A but up to Mod-Max A at times (likely attempting to offload painful L hip?) Postural control: Posterior lean, Right lateral lean         Pertinent Vitals/Pain Pain Assessment Pain Assessment: Faces Faces Pain Scale: Hurts little more Breathing: normal Negative Vocalization: none Facial Expression: smiling or inexpressive Body Language: relaxed Consolability: no need to console PAINAD Score: 0 Pain Location: L hip Pain Descriptors / Indicators: Grimacing Pain Intervention(s): Monitored during session, Limited activity within patient's tolerance    Home Living Family/patient expects to be discharged to:: Private residence Living Arrangements: Children;Other relatives (grandson) Available Help at Discharge: Family;Available PRN/intermittently;Personal care attendant Type of Home: House Home Access: Ramped entrance Entrance Stairs-Rails: Right     Home Layout: One level Home Equipment: Agricultural consultant (2 wheels);Shower seat;BSC/3in1;Wheelchair - manual Additional Comments: no one stays overnight typically. aide 3 hours in the  morning and in the afternoon on  weekdays    Prior Function Prior Level of Function : History of Falls (last six months);Independent/Modified Independent             Mobility Comments: use of RW vs wheelchair to mobilize in home ADLs Comments: Mod I with ADLs, can assist with some IADLs but family mostly handles. typically slow to respond with communication at baseline     Extremity/Trunk Assessment   Upper Extremity Assessment Upper Extremity Assessment: Defer to OT evaluation    Lower Extremity Assessment Lower Extremity Assessment: Generalized weakness    Cervical / Trunk Assessment Cervical / Trunk Assessment: Kyphotic  Communication   Communication Communication: Difficulty following commands/understanding;Difficulty communicating thoughts/reduced clarity of speech Following commands: Follows one step commands inconsistently;Follows one step commands with increased time Cueing Techniques: Verbal cues;Gestural cues;Tactile cues;Visual cues  Cognition Arousal: Alert Behavior During Therapy: Flat affect Overall Cognitive Status: Difficult to assess       General Comments: per son present, pt typically conversant and responds though slow to respond at baseline. Pt with very inconsistent command following but was noted with some improvements in responding to son. some initiation noted with bed mobility though inconsistent when cued        General Comments General comments (skin integrity, edema, etc.): son present throughout session providing information.        Assessment/Plan    PT Assessment Patient needs continued PT services  PT Problem List Decreased mobility;Decreased strength;Pain;Decreased activity tolerance       PT Treatment Interventions DME instruction;Therapeutic exercise;Gait training;Balance training;Neuromuscular re-education;Functional mobility training;Therapeutic activities;Patient/family education    PT Goals (Current goals can be found in  the Care Plan section)  Acute Rehab PT Goals Patient Stated Goal: To return home with family PT Goal Formulation: With family Time For Goal Achievement: 03/30/23 Potential to Achieve Goals: Fair    Frequency Min 1X/week     Co-evaluation PT/OT/SLP Co-Evaluation/Treatment: Yes Reason for Co-Treatment: Necessary to address cognition/behavior during functional activity;For patient/therapist safety;To address functional/ADL transfers PT goals addressed during session: Mobility/safety with mobility OT goals addressed during session: Strengthening/ROM;ADL's and self-care       AM-PAC PT "6 Clicks" Mobility  Outcome Measure Help needed turning from your back to your side while in a flat bed without using bedrails?: Total Help needed moving from lying on your back to sitting on the side of a flat bed without using bedrails?: Total Help needed moving to and from a bed to a chair (including a wheelchair)?: Total Help needed standing up from a chair using your arms (e.g., wheelchair or bedside chair)?: Total Help needed to walk in hospital room?: Total Help needed climbing 3-5 steps with a railing? : Total 6 Click Score: 6    End of Session   Activity Tolerance: Patient tolerated treatment well;Patient limited by pain Patient left: in bed;with call bell/phone within reach;with bed alarm set;with family/visitor present Nurse Communication: Mobility status PT Visit Diagnosis: Other abnormalities of gait and mobility (R26.89)    Time: 1411-1434 PT Time Calculation (min) (ACUTE ONLY): 23 min   Charges:   PT Evaluation $PT Eval Low Complexity: 1 Low   PT General Charges $$ ACUTE PT VISIT: 1 Visit       Harrel Carina, DPT, CLT  Acute Rehabilitation Services Office: [phone] (Secure chat preferred)   Claudia Desanctis 03/16/2023, 3:50 PM

## 2023-03-16 NOTE — Progress Notes (Addendum)
PHARMACY - ANTICOAGULATION CONSULT NOTE  Pharmacy Consult for heparin Indication: atrial fibrillation (PTA Eliquis)  No Known Allergies  Patient Measurements: Height: 5' 4.02" (162.6 cm) Weight: 53.2 kg (117 lb 4.6 oz) IBW/kg (Calculated) : 54.74 Heparin Dosing Weight: 52.9 kg  Vital Signs: Temp: 97 F (36.1 C) (10/21 1303) Temp Source: Temporal (10/21 1303) BP: 121/59 (10/21 1303) Pulse Rate: 87 (10/21 1303)  Labs: Recent Labs    03/14/23 0448 03/15/23 0930 03/16/23 0456  HGB 12.1 10.1* 8.4*  HCT 37.0 31.7* 26.0*  PLT 185 154 155  CREATININE 4.20* 6.17* 7.38*    Estimated Creatinine Clearance: 6 mL/min (A) (by C-G formula based on SCr of 7.38 mg/dL (H)).   Medical History: Past Medical History:  Diagnosis Date   Acute delirium 11/18/2014   Acute encephalopathy    Acute on chronic respiratory failure with hypoxia (HCC) 09/09/2016   Acute respiratory failure with hypoxia (HCC) 11/29/2015   Anemia in chronic kidney disease 12/09/2014   Anxiety    Arthritis    Benign hypertension    Bipolar affective disorder (HCC) 12/09/2014   CAD (coronary artery disease), native coronary artery with 2 stents  11/17/2014   Charcot foot due to diabetes mellitus (HCC)    Chronic combined systolic and diastolic CHF (congestive heart failure) (HCC) 11/18/2014   Closed left ankle fracture 11/17/2014   Confusion 01/21/2015   Depression    Diabetes mellitus without complication (HCC)    End stage renal disease on dialysis (HCC)    Fracture dislocation of ankle 11/17/2014   GERD (gastroesophageal reflux disease)    Heart murmur    History of blood transfusion    History of bronchitis    History of pneumonia    Hyperlipidemia 03/26/2015   Hypertension associated with diabetes (HCC) 11/18/2014   Hypertensive heart/renal disease with failure (HCC) 12/09/2014   Hypokalemia 11/17/2014   Multiple falls 01/21/2015   Nausea & vomiting 01/15/2023   Obesity 11/17/2014   Onychomycosis 10/07/2016    Right leg DVT (HCC) 12/05/2015   SIRS (systemic inflammatory response syndrome) (HCC) 08/08/2016   Sleep apnea    Unstable angina (HCC) 12/26/2017   Ventricular tachycardia [facility])     Assessment: 72 yo female with PMH of DVT in 2017 and Afib on Eliquis (last dose PTA, 10/17 AM) who presented 10/17 with stroke confirmed on CT head / MRI and now s/p L hip replacement 10/19.  Head CT 10/20 with no acute hemorrhage.  Pharmacy consulted for heparin dosing until able to tolerate PO.  Goal of Therapy:  Heparin level 0.3-0.5 units/ml, no bolus Monitor platelets by anticoagulation protocol: Yes   Plan:  Heparin 700 units/hr, no bolus 8 hour heparin level Daily heparin level, CBC  F/u ability to transition to Eliquis pending PO  Trixie Rude, PharmD Clinical Pharmacist 03/16/2023  2:37 PM

## 2023-03-17 ENCOUNTER — Encounter (HOSPITAL_COMMUNITY): Payer: Self-pay | Admitting: Orthopedic Surgery

## 2023-03-17 DIAGNOSIS — S72142A Displaced intertrochanteric fracture of left femur, initial encounter for closed fracture: Secondary | ICD-10-CM | POA: Diagnosis not present

## 2023-03-17 DIAGNOSIS — Z515 Encounter for palliative care: Secondary | ICD-10-CM | POA: Diagnosis not present

## 2023-03-17 DIAGNOSIS — R569 Unspecified convulsions: Secondary | ICD-10-CM | POA: Diagnosis not present

## 2023-03-17 DIAGNOSIS — Z7189 Other specified counseling: Secondary | ICD-10-CM | POA: Diagnosis not present

## 2023-03-17 DIAGNOSIS — I63341 Cerebral infarction due to thrombosis of right cerebellar artery: Secondary | ICD-10-CM | POA: Diagnosis not present

## 2023-03-17 DIAGNOSIS — I4891 Unspecified atrial fibrillation: Secondary | ICD-10-CM | POA: Diagnosis not present

## 2023-03-17 LAB — COMPREHENSIVE METABOLIC PANEL
ALT: <5 U/L (ref 0–44)
AST: 17 U/L (ref 15–41)
Albumin: 2.2 g/dL — ABNORMAL LOW (ref 3.5–5.0)
Alkaline Phosphatase: 70 U/L (ref 38–126)
Anion gap: 17 — ABNORMAL HIGH (ref 5–15)
BUN: 30 mg/dL — ABNORMAL HIGH (ref 8–23)
CO2: 24 mmol/L (ref 22–32)
Calcium: 8.9 mg/dL (ref 8.9–10.3)
Chloride: 98 mmol/L (ref 98–111)
Creatinine, Ser: 5.25 mg/dL — ABNORMAL HIGH (ref 0.44–1.00)
GFR, Estimated: 8 mL/min — ABNORMAL LOW (ref >60)
Glucose, Bld: 64 mg/dL — ABNORMAL LOW (ref 70–99)
Potassium: 3.5 mmol/L (ref 3.5–5.1)
Sodium: 139 mmol/L (ref 135–145)
Total Bilirubin: 0.6 mg/dL (ref 0.3–1.2)
Total Protein: 5.7 g/dL — ABNORMAL LOW (ref 6.5–8.1)

## 2023-03-17 LAB — CBC
HCT: 23.8 % — ABNORMAL LOW (ref 36.0–46.0)
Hemoglobin: 7.5 g/dL — ABNORMAL LOW (ref 12.0–15.0)
MCH: 30.4 pg (ref 26.0–34.0)
MCHC: 31.5 g/dL (ref 30.0–36.0)
MCV: 96.4 fL (ref 80.0–100.0)
Platelets: 164 10*3/uL (ref 150–400)
RBC: 2.47 MIL/uL — ABNORMAL LOW (ref 3.87–5.11)
RDW: 17.3 % — ABNORMAL HIGH (ref 11.5–15.5)
WBC: 5.4 10*3/uL (ref 4.0–10.5)
nRBC: 0 % (ref 0.0–0.2)

## 2023-03-17 LAB — PHOSPHORUS: Phosphorus: 6.5 mg/dL — ABNORMAL HIGH (ref 2.5–4.6)

## 2023-03-17 LAB — GLUCOSE, CAPILLARY
Glucose-Capillary: 61 mg/dL — ABNORMAL LOW (ref 70–99)
Glucose-Capillary: 115 mg/dL — ABNORMAL HIGH (ref 70–99)
Glucose-Capillary: 64 mg/dL — ABNORMAL LOW (ref 70–99)
Glucose-Capillary: 73 mg/dL (ref 70–99)
Glucose-Capillary: 64 mg/dL — ABNORMAL LOW (ref 70–99)

## 2023-03-17 LAB — HEPARIN LEVEL (UNFRACTIONATED): Heparin Unfractionated: 0.31 [IU]/mL (ref 0.30–0.70)

## 2023-03-17 LAB — MAGNESIUM: Magnesium: 1.9 mg/dL (ref 1.7–2.4)

## 2023-03-17 LAB — HEMOGLOBIN AND HEMATOCRIT, BLOOD
HCT: 28.4 % — ABNORMAL LOW (ref 36.0–46.0)
Hemoglobin: 8.9 g/dL — ABNORMAL LOW (ref 12.0–15.0)

## 2023-03-17 LAB — IRON AND TIBC
Iron: 36 ug/dL (ref 28–170)
Saturation Ratios: 28 % (ref 10.4–31.8)
TIBC: 127 ug/dL — ABNORMAL LOW (ref 250–450)
UIBC: 91 ug/dL

## 2023-03-17 LAB — FERRITIN: Ferritin: 1515 ng/mL — ABNORMAL HIGH (ref 11–307)

## 2023-03-17 MED ORDER — DEXTROSE 50 % IV SOLN
12.5000 g | INTRAVENOUS | Status: AC
  Administered 2023-03-17: 12.5 g via INTRAVENOUS

## 2023-03-17 MED ORDER — DEXTROSE 10 % IV SOLN
INTRAVENOUS | Status: DC
Start: 2023-03-17 — End: 2023-03-19

## 2023-03-17 MED ORDER — DEXTROSE 50 % IV SOLN
12.5000 g | INTRAVENOUS | Status: AC
  Administered 2023-03-17: 12.5 g via INTRAVENOUS
  Filled 2023-03-17: qty 50

## 2023-03-17 MED ORDER — VALPROATE SODIUM 100 MG/ML IV SOLN
500.0000 mg | Freq: Four times a day (QID) | INTRAVENOUS | Status: DC
  Administered 2023-03-17 – 2023-03-18 (×4): 500 mg via INTRAVENOUS
  Filled 2023-03-17 (×6): qty 5

## 2023-03-17 MED ORDER — DEXTROSE 50 % IV SOLN
INTRAVENOUS | Status: AC
  Filled 2023-03-17: qty 50

## 2023-03-17 MED ORDER — PHENOBARBITAL SODIUM 65 MG/ML IJ SOLN
65.0000 mg | Freq: Every day | INTRAMUSCULAR | Status: DC
  Administered 2023-03-17: 65 mg via INTRAVENOUS
  Filled 2023-03-17: qty 1

## 2023-03-17 NOTE — Progress Notes (Signed)
with the acute infarct noted on the prior MRI, with new hypodensity in the right greater than left aspect of the pons (series 3, image 10), which may be artifactual. No evidence of acute hemorrhage, mass, mass effect, or midline shift. No hydrocephalus or extra-axial fluid collection. Vascular: No hyperdense vessel. Redemonstrated bilateral pipeline stents. Atherosclerotic calcifications in the vertebral arteries. Skull: Negative for fracture or focal lesion. Sinuses/Orbits: No acute finding. Other: The mastoid air cells are well aerated. IMPRESSION: 1. New hypodensity in the right greater than left aspect of the pons, which may be artifactual. Consider MRI for further evaluation. 2. 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 No acute intracranial hemorrhage. These results will be called to the ordering clinician or representative by the Radiologist Assistant, and communication documented in the PACS or [employer]. Electronically Signed   By: Wiliam Ke M.D.   On: 03/15/2023 19:51   ECHOCARDIOGRAM COMPLETE BUBBLE STUDY  Result Date: 03/15/2023    ECHOCARDIOGRAM REPORT   Patient Name:   Robin [name] Date of Exam: 03/15/2023 Medical Rec #:  [id number]    Height:       64.0 in Accession #:    [id number]   Weight:       116.6 lb Date of Birth:  1951-04-26    BSA:          1.556 m Patient Age:    72 years     BP:           206/187 mmHg Patient Gender: F            HR:           96 bpm. Exam Location:  Inpatient Procedure: 2D Echo, Cardiac Doppler and Color Doppler Indications:    stroke  History:        Patient has prior history of Echocardiogram examinations, most                 recent 07/29/2022. CHF, end stage renal disease, Arrythmias:PAC;                 Risk Factors:Diabetes, Dyslipidemia and Current Smoker.  Sonographer:    Delcie Roch RDCS Referring Phys: [id number] MICHAEL A MOORE IMPRESSIONS  1. Left ventricular  ejection fraction, by estimation, is 40 to 45%. The left ventricle has mildly decreased function. The left ventricle demonstrates global hypokinesis. There is moderate concentric left ventricular hypertrophy. Left ventricular diastolic parameters are indeterminate.  2. Right ventricular systolic function is mildly reduced. The right ventricular size is not well visualized. There is normal pulmonary artery systolic pressure.  3. The mitral valve is abnormal. Mild mitral valve regurgitation. No evidence of mitral stenosis. The mean mitral valve gradient is 2.0 mmHg. Moderate mitral annular calcification.  4. The aortic valve is tricuspid. Aortic valve regurgitation is not visualized. Aortic valve sclerosis is present, with no evidence of aortic valve stenosis.  5. The inferior vena cava is normal in size with greater than 50% respiratory variability, suggesting right atrial pressure of 3 mmHg.  6. Agitated saline contrast bubble study was negative, with no evidence of any interatrial shunt. FINDINGS  Left Ventricle: Left ventricular ejection fraction, by estimation, is 40 to 45%. The left ventricle has mildly decreased function. The left ventricle demonstrates global hypokinesis. The left ventricular internal cavity size was normal in size. There is  moderate concentric left ventricular hypertrophy. Left ventricular diastolic parameters are indeterminate. Right Ventricle: The right ventricular size is not well visualized. No increase  with the acute infarct noted on the prior MRI, with new hypodensity in the right greater than left aspect of the pons (series 3, image 10), which may be artifactual. No evidence of acute hemorrhage, mass, mass effect, or midline shift. No hydrocephalus or extra-axial fluid collection. Vascular: No hyperdense vessel. Redemonstrated bilateral pipeline stents. Atherosclerotic calcifications in the vertebral arteries. Skull: Negative for fracture or focal lesion. Sinuses/Orbits: No acute finding. Other: The mastoid air cells are well aerated. IMPRESSION: 1. New hypodensity in the right greater than left aspect of the pons, which may be artifactual. Consider MRI for further evaluation. 2. 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 No acute intracranial hemorrhage. These results will be called to the ordering clinician or representative by the Radiologist Assistant, and communication documented in the PACS or [employer]. Electronically Signed   By: Wiliam Ke M.D.   On: 03/15/2023 19:51   ECHOCARDIOGRAM COMPLETE BUBBLE STUDY  Result Date: 03/15/2023    ECHOCARDIOGRAM REPORT   Patient Name:   Robin [name] Date of Exam: 03/15/2023 Medical Rec #:  [id number]    Height:       64.0 in Accession #:    [id number]   Weight:       116.6 lb Date of Birth:  1951-04-26    BSA:          1.556 m Patient Age:    72 years     BP:           206/187 mmHg Patient Gender: F            HR:           96 bpm. Exam Location:  Inpatient Procedure: 2D Echo, Cardiac Doppler and Color Doppler Indications:    stroke  History:        Patient has prior history of Echocardiogram examinations, most                 recent 07/29/2022. CHF, end stage renal disease, Arrythmias:PAC;                 Risk Factors:Diabetes, Dyslipidemia and Current Smoker.  Sonographer:    Delcie Roch RDCS Referring Phys: [id number] MICHAEL A MOORE IMPRESSIONS  1. Left ventricular  ejection fraction, by estimation, is 40 to 45%. The left ventricle has mildly decreased function. The left ventricle demonstrates global hypokinesis. There is moderate concentric left ventricular hypertrophy. Left ventricular diastolic parameters are indeterminate.  2. Right ventricular systolic function is mildly reduced. The right ventricular size is not well visualized. There is normal pulmonary artery systolic pressure.  3. The mitral valve is abnormal. Mild mitral valve regurgitation. No evidence of mitral stenosis. The mean mitral valve gradient is 2.0 mmHg. Moderate mitral annular calcification.  4. The aortic valve is tricuspid. Aortic valve regurgitation is not visualized. Aortic valve sclerosis is present, with no evidence of aortic valve stenosis.  5. The inferior vena cava is normal in size with greater than 50% respiratory variability, suggesting right atrial pressure of 3 mmHg.  6. Agitated saline contrast bubble study was negative, with no evidence of any interatrial shunt. FINDINGS  Left Ventricle: Left ventricular ejection fraction, by estimation, is 40 to 45%. The left ventricle has mildly decreased function. The left ventricle demonstrates global hypokinesis. The left ventricular internal cavity size was normal in size. There is  moderate concentric left ventricular hypertrophy. Left ventricular diastolic parameters are indeterminate. Right Ventricle: The right ventricular size is not well visualized. No increase  Cardiology Progress Note  Patient ID: FILOMENA ADAMEC MRN: [id number] DOB: [birth year] Date of Encounter: 03/17/2023  Primary Cardiologist: Nicki Guadalajara, MD  Subjective   Chief Complaint: None.   HPI: Remains drowsy.  Review of records show refractory seizures as well as diffuse encephalopathy on EEG.  Maintaining sinus rhythm.  ROS:  All other ROS reviewed and negative. Pertinent positives noted in the HPI.     Inpatient Medications  Scheduled Meds:  atorvastatin  40 mg Oral Daily   Chlorhexidine Gluconate Cloth  6 each Topical Q0600   clopidogrel  75 mg Oral Daily   dextrose  12.5 g Intravenous STAT   doxercalciferol  5 mcg Intravenous Q M,W,F-HD   insulin aspart  0-5 Units Subcutaneous QHS   insulin aspart  0-6 Units Subcutaneous TID WC   metoprolol tartrate  2.5 mg Intravenous Q6H   multivitamin  1 tablet Oral QHS   pantoprazole  40 mg Oral QHS   sevelamer carbonate  1,600 mg Oral TID WC   Continuous Infusions:  dextrose     heparin 700 Units/hr (03/16/23 2350)   lacosamide (VIMPAT) IV 150 mg (03/17/23 0528)   levETIRAcetam     And   levETIRAcetam Stopped (03/16/23 1739)   tranexamic acid     valproate sodium     PRN Meds: acetaminophen, albuterol, labetalol, melatonin, metoprolol tartrate, oxyCODONE, polyethylene glycol, prochlorperazine   Vital Signs   Vitals:   03/16/23 2018 03/17/23 0410 03/17/23 0712 03/17/23 0800  BP: (!) 115/52 121/63 125/81   Pulse: 70 (!) 54 75   Resp: 19 16 18    Temp: 97.6 F (36.4 C) 97.9 F (36.6 C)    TempSrc: Axillary Axillary    SpO2:  100% 100% 100%  Weight:      Height:        Intake/Output Summary (Last 24 hours) at 03/17/2023 0930 Last data filed at 03/17/2023 0407 Gross per 24 hour  Intake 379.57 ml  Output 400 ml  Net -20.43 ml      03/16/2023    8:46 AM 03/14/2023    7:13 AM 03/13/2023    5:31 PM  Last 3 Weights  Weight (lbs) 117 lb 4.6 oz 116 lb 10 oz 116 lb 10 oz  Weight (kg) 53.2 kg 52.9 kg 52.9 kg       Telemetry  Overnight telemetry shows sinus rhythm 70s, PACs, which I personally reviewed.   Physical Exam   Vitals:   03/16/23 2018 03/17/23 0410 03/17/23 0712 03/17/23 0800  BP: (!) 115/52 121/63 125/81   Pulse: 70 (!) 54 75   Resp: 19 16 18    Temp: 97.6 F (36.4 C) 97.9 F (36.6 C)    TempSrc: Axillary Axillary    SpO2:  100% 100% 100%  Weight:      Height:        Intake/Output Summary (Last 24 hours) at 03/17/2023 0930 Last data filed at 03/17/2023 0407 Gross per 24 hour  Intake 379.57 ml  Output 400 ml  Net -20.43 ml       03/16/2023    8:46 AM 03/14/2023    7:13 AM 03/13/2023    5:31 PM  Last 3 Weights  Weight (lbs) 117 lb 4.6 oz 116 lb 10 oz 116 lb 10 oz  Weight (kg) 53.2 kg 52.9 kg 52.9 kg    Body mass index is 20.12 kg/m.  General: Drowsy, not following commands Head: Atraumatic, normal size  Eyes: PEERLA, EOMI  Neck: Supple, no JVD Endocrine:  with the acute infarct noted on the prior MRI, with new hypodensity in the right greater than left aspect of the pons (series 3, image 10), which may be artifactual. No evidence of acute hemorrhage, mass, mass effect, or midline shift. No hydrocephalus or extra-axial fluid collection. Vascular: No hyperdense vessel. Redemonstrated bilateral pipeline stents. Atherosclerotic calcifications in the vertebral arteries. Skull: Negative for fracture or focal lesion. Sinuses/Orbits: No acute finding. Other: The mastoid air cells are well aerated. IMPRESSION: 1. New hypodensity in the right greater than left aspect of the pons, which may be artifactual. Consider MRI for further evaluation. 2. 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 No acute intracranial hemorrhage. These results will be called to the ordering clinician or representative by the Radiologist Assistant, and communication documented in the PACS or [employer]. Electronically Signed   By: Wiliam Ke M.D.   On: 03/15/2023 19:51   ECHOCARDIOGRAM COMPLETE BUBBLE STUDY  Result Date: 03/15/2023    ECHOCARDIOGRAM REPORT   Patient Name:   Robin [name] Date of Exam: 03/15/2023 Medical Rec #:  [id number]    Height:       64.0 in Accession #:    [id number]   Weight:       116.6 lb Date of Birth:  1951-04-26    BSA:          1.556 m Patient Age:    72 years     BP:           206/187 mmHg Patient Gender: F            HR:           96 bpm. Exam Location:  Inpatient Procedure: 2D Echo, Cardiac Doppler and Color Doppler Indications:    stroke  History:        Patient has prior history of Echocardiogram examinations, most                 recent 07/29/2022. CHF, end stage renal disease, Arrythmias:PAC;                 Risk Factors:Diabetes, Dyslipidemia and Current Smoker.  Sonographer:    Delcie Roch RDCS Referring Phys: [id number] MICHAEL A MOORE IMPRESSIONS  1. Left ventricular  ejection fraction, by estimation, is 40 to 45%. The left ventricle has mildly decreased function. The left ventricle demonstrates global hypokinesis. There is moderate concentric left ventricular hypertrophy. Left ventricular diastolic parameters are indeterminate.  2. Right ventricular systolic function is mildly reduced. The right ventricular size is not well visualized. There is normal pulmonary artery systolic pressure.  3. The mitral valve is abnormal. Mild mitral valve regurgitation. No evidence of mitral stenosis. The mean mitral valve gradient is 2.0 mmHg. Moderate mitral annular calcification.  4. The aortic valve is tricuspid. Aortic valve regurgitation is not visualized. Aortic valve sclerosis is present, with no evidence of aortic valve stenosis.  5. The inferior vena cava is normal in size with greater than 50% respiratory variability, suggesting right atrial pressure of 3 mmHg.  6. Agitated saline contrast bubble study was negative, with no evidence of any interatrial shunt. FINDINGS  Left Ventricle: Left ventricular ejection fraction, by estimation, is 40 to 45%. The left ventricle has mildly decreased function. The left ventricle demonstrates global hypokinesis. The left ventricular internal cavity size was normal in size. There is  moderate concentric left ventricular hypertrophy. Left ventricular diastolic parameters are indeterminate. Right Ventricle: The right ventricular size is not well visualized. No increase  Cardiology Progress Note  Patient ID: FILOMENA ADAMEC MRN: [id number] DOB: [birth year] Date of Encounter: 03/17/2023  Primary Cardiologist: Nicki Guadalajara, MD  Subjective   Chief Complaint: None.   HPI: Remains drowsy.  Review of records show refractory seizures as well as diffuse encephalopathy on EEG.  Maintaining sinus rhythm.  ROS:  All other ROS reviewed and negative. Pertinent positives noted in the HPI.     Inpatient Medications  Scheduled Meds:  atorvastatin  40 mg Oral Daily   Chlorhexidine Gluconate Cloth  6 each Topical Q0600   clopidogrel  75 mg Oral Daily   dextrose  12.5 g Intravenous STAT   doxercalciferol  5 mcg Intravenous Q M,W,F-HD   insulin aspart  0-5 Units Subcutaneous QHS   insulin aspart  0-6 Units Subcutaneous TID WC   metoprolol tartrate  2.5 mg Intravenous Q6H   multivitamin  1 tablet Oral QHS   pantoprazole  40 mg Oral QHS   sevelamer carbonate  1,600 mg Oral TID WC   Continuous Infusions:  dextrose     heparin 700 Units/hr (03/16/23 2350)   lacosamide (VIMPAT) IV 150 mg (03/17/23 0528)   levETIRAcetam     And   levETIRAcetam Stopped (03/16/23 1739)   tranexamic acid     valproate sodium     PRN Meds: acetaminophen, albuterol, labetalol, melatonin, metoprolol tartrate, oxyCODONE, polyethylene glycol, prochlorperazine   Vital Signs   Vitals:   03/16/23 2018 03/17/23 0410 03/17/23 0712 03/17/23 0800  BP: (!) 115/52 121/63 125/81   Pulse: 70 (!) 54 75   Resp: 19 16 18    Temp: 97.6 F (36.4 C) 97.9 F (36.6 C)    TempSrc: Axillary Axillary    SpO2:  100% 100% 100%  Weight:      Height:        Intake/Output Summary (Last 24 hours) at 03/17/2023 0930 Last data filed at 03/17/2023 0407 Gross per 24 hour  Intake 379.57 ml  Output 400 ml  Net -20.43 ml      03/16/2023    8:46 AM 03/14/2023    7:13 AM 03/13/2023    5:31 PM  Last 3 Weights  Weight (lbs) 117 lb 4.6 oz 116 lb 10 oz 116 lb 10 oz  Weight (kg) 53.2 kg 52.9 kg 52.9 kg       Telemetry  Overnight telemetry shows sinus rhythm 70s, PACs, which I personally reviewed.   Physical Exam   Vitals:   03/16/23 2018 03/17/23 0410 03/17/23 0712 03/17/23 0800  BP: (!) 115/52 121/63 125/81   Pulse: 70 (!) 54 75   Resp: 19 16 18    Temp: 97.6 F (36.4 C) 97.9 F (36.6 C)    TempSrc: Axillary Axillary    SpO2:  100% 100% 100%  Weight:      Height:        Intake/Output Summary (Last 24 hours) at 03/17/2023 0930 Last data filed at 03/17/2023 0407 Gross per 24 hour  Intake 379.57 ml  Output 400 ml  Net -20.43 ml       03/16/2023    8:46 AM 03/14/2023    7:13 AM 03/13/2023    5:31 PM  Last 3 Weights  Weight (lbs) 117 lb 4.6 oz 116 lb 10 oz 116 lb 10 oz  Weight (kg) 53.2 kg 52.9 kg 52.9 kg    Body mass index is 20.12 kg/m.  General: Drowsy, not following commands Head: Atraumatic, normal size  Eyes: PEERLA, EOMI  Neck: Supple, no JVD Endocrine:  with the acute infarct noted on the prior MRI, with new hypodensity in the right greater than left aspect of the pons (series 3, image 10), which may be artifactual. No evidence of acute hemorrhage, mass, mass effect, or midline shift. No hydrocephalus or extra-axial fluid collection. Vascular: No hyperdense vessel. Redemonstrated bilateral pipeline stents. Atherosclerotic calcifications in the vertebral arteries. Skull: Negative for fracture or focal lesion. Sinuses/Orbits: No acute finding. Other: The mastoid air cells are well aerated. IMPRESSION: 1. New hypodensity in the right greater than left aspect of the pons, which may be artifactual. Consider MRI for further evaluation. 2. 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 No acute intracranial hemorrhage. These results will be called to the ordering clinician or representative by the Radiologist Assistant, and communication documented in the PACS or [employer]. Electronically Signed   By: Wiliam Ke M.D.   On: 03/15/2023 19:51   ECHOCARDIOGRAM COMPLETE BUBBLE STUDY  Result Date: 03/15/2023    ECHOCARDIOGRAM REPORT   Patient Name:   Robin [name] Date of Exam: 03/15/2023 Medical Rec #:  [id number]    Height:       64.0 in Accession #:    [id number]   Weight:       116.6 lb Date of Birth:  1951-04-26    BSA:          1.556 m Patient Age:    72 years     BP:           206/187 mmHg Patient Gender: F            HR:           96 bpm. Exam Location:  Inpatient Procedure: 2D Echo, Cardiac Doppler and Color Doppler Indications:    stroke  History:        Patient has prior history of Echocardiogram examinations, most                 recent 07/29/2022. CHF, end stage renal disease, Arrythmias:PAC;                 Risk Factors:Diabetes, Dyslipidemia and Current Smoker.  Sonographer:    Delcie Roch RDCS Referring Phys: [id number] MICHAEL A MOORE IMPRESSIONS  1. Left ventricular  ejection fraction, by estimation, is 40 to 45%. The left ventricle has mildly decreased function. The left ventricle demonstrates global hypokinesis. There is moderate concentric left ventricular hypertrophy. Left ventricular diastolic parameters are indeterminate.  2. Right ventricular systolic function is mildly reduced. The right ventricular size is not well visualized. There is normal pulmonary artery systolic pressure.  3. The mitral valve is abnormal. Mild mitral valve regurgitation. No evidence of mitral stenosis. The mean mitral valve gradient is 2.0 mmHg. Moderate mitral annular calcification.  4. The aortic valve is tricuspid. Aortic valve regurgitation is not visualized. Aortic valve sclerosis is present, with no evidence of aortic valve stenosis.  5. The inferior vena cava is normal in size with greater than 50% respiratory variability, suggesting right atrial pressure of 3 mmHg.  6. Agitated saline contrast bubble study was negative, with no evidence of any interatrial shunt. FINDINGS  Left Ventricle: Left ventricular ejection fraction, by estimation, is 40 to 45%. The left ventricle has mildly decreased function. The left ventricle demonstrates global hypokinesis. The left ventricular internal cavity size was normal in size. There is  moderate concentric left ventricular hypertrophy. Left ventricular diastolic parameters are indeterminate. Right Ventricle: The right ventricular size is not well visualized. No increase  No thryomegaly Cardiac: Normal S1, S2; RRR; no murmurs, rubs, or gallops Lungs: Clear to auscultation bilaterally, no wheezing, rhonchi or rales  Abd: Soft, nontender, no hepatomegaly  Ext: No edema, pulses 2+ Musculoskeletal: No deformities  Labs  High Sensitivity Troponin:  No results for input(s): "TROPONINIHS" in the last 720 hours.   Cardiac EnzymesNo results for input(s): "TROPONINI" in the last 168 hours. No results for input(s): "TROPIPOC" in the last 168 hours.  Chemistry Recent Labs  Lab 03/15/23 0930 03/16/23 0456 03/17/23 0627  NA 133* 136 139  K 5.1 4.0 3.5  CL 90* 94* 98  CO2 19* 23 24  GLUCOSE 226* 97 64*  BUN 36* 44* 30*  CREATININE 6.17* 7.38* 5.25*  CALCIUM 9.7 9.5 8.9  PROT  --  5.9* 5.7*  ALBUMIN 3.0* 2.4* 2.2*  AST  --  19 17  ALT  --  <5 <5  ALKPHOS  --  79 70  BILITOT  --  0.7 0.6  GFRNONAA 7* 6* 8*  ANIONGAP 24* 19* 17*    Hematology Recent Labs  Lab 03/15/23 0930 03/16/23 0456 03/16/23 1446 03/17/23 0627  WBC 12.1* 8.4  --  5.4  RBC 3.31* 2.71*  --  2.47*  HGB  10.1* 8.4* 9.1* 7.5*  HCT 31.7* 26.0* 28.7* 23.8*  MCV 95.8 95.9  --  96.4  MCH 30.5 31.0  --  30.4  MCHC 31.9 32.3  --  31.5  RDW 17.9* 17.7*  --  17.3*  PLT 154 155  --  164   BNPNo results for input(s): "BNP", "PROBNP" in the last 168 hours.  DDimer No results for input(s): "DDIMER" in the last 168 hours.   Radiology  VAS US CAROTID  Result Date: 03/16/2023 Carotid Arterial Duplex Study Patient Name:  MIRANDA MARMER  Date of Exam:   03/15/2023 Medical Rec #: [id number]     Accession #:    [id number] Date of Birth: [birth year]     Patient Gender: F Patient Age:   54 years Exam Location:  [facility] Procedure:      VAS US CAROTID Referring Phys: Willia Craze --------------------------------------------------------------------------------  Indications:       CVA and Fall, hip fracture, s/p IM repair. Risk Factors:      Hypertension, hyperlipidemia, Diabetes, coronary artery                    disease. Other Factors:     ESRD, on dialysis. Limitations        Today's exam was limited due to the patient's inability or                    unwillingness to cooperate and the body habitus of the                    patient. Comparison Study:  No prior study Performing Technologist: Sherren Kerns RVS  Examination Guidelines: A complete evaluation includes B-mode imaging, spectral Doppler, color Doppler, [name] Bilateral testing is considered an integral part of a complete examination. Limited examinations for reoccurring indications may be performed as noted.  Right Carotid Findings: +----------+--------+--------+--------+------------------+------------------+           PSV cm/sEDV cm/sStenosisPlaque DescriptionComments           +----------+--------+--------+--------+------------------+------------------+ CCA Prox  42      7  with the acute infarct noted on the prior MRI, with new hypodensity in the right greater than left aspect of the pons (series 3, image 10), which may be artifactual. No evidence of acute hemorrhage, mass, mass effect, or midline shift. No hydrocephalus or extra-axial fluid collection. Vascular: No hyperdense vessel. Redemonstrated bilateral pipeline stents. Atherosclerotic calcifications in the vertebral arteries. Skull: Negative for fracture or focal lesion. Sinuses/Orbits: No acute finding. Other: The mastoid air cells are well aerated. IMPRESSION: 1. New hypodensity in the right greater than left aspect of the pons, which may be artifactual. Consider MRI for further evaluation. 2. 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 No acute intracranial hemorrhage. These results will be called to the ordering clinician or representative by the Radiologist Assistant, and communication documented in the PACS or [employer]. Electronically Signed   By: Wiliam Ke M.D.   On: 03/15/2023 19:51   ECHOCARDIOGRAM COMPLETE BUBBLE STUDY  Result Date: 03/15/2023    ECHOCARDIOGRAM REPORT   Patient Name:   Robin [name] Date of Exam: 03/15/2023 Medical Rec #:  [id number]    Height:       64.0 in Accession #:    [id number]   Weight:       116.6 lb Date of Birth:  1951-04-26    BSA:          1.556 m Patient Age:    72 years     BP:           206/187 mmHg Patient Gender: F            HR:           96 bpm. Exam Location:  Inpatient Procedure: 2D Echo, Cardiac Doppler and Color Doppler Indications:    stroke  History:        Patient has prior history of Echocardiogram examinations, most                 recent 07/29/2022. CHF, end stage renal disease, Arrythmias:PAC;                 Risk Factors:Diabetes, Dyslipidemia and Current Smoker.  Sonographer:    Delcie Roch RDCS Referring Phys: [id number] MICHAEL A MOORE IMPRESSIONS  1. Left ventricular  ejection fraction, by estimation, is 40 to 45%. The left ventricle has mildly decreased function. The left ventricle demonstrates global hypokinesis. There is moderate concentric left ventricular hypertrophy. Left ventricular diastolic parameters are indeterminate.  2. Right ventricular systolic function is mildly reduced. The right ventricular size is not well visualized. There is normal pulmonary artery systolic pressure.  3. The mitral valve is abnormal. Mild mitral valve regurgitation. No evidence of mitral stenosis. The mean mitral valve gradient is 2.0 mmHg. Moderate mitral annular calcification.  4. The aortic valve is tricuspid. Aortic valve regurgitation is not visualized. Aortic valve sclerosis is present, with no evidence of aortic valve stenosis.  5. The inferior vena cava is normal in size with greater than 50% respiratory variability, suggesting right atrial pressure of 3 mmHg.  6. Agitated saline contrast bubble study was negative, with no evidence of any interatrial shunt. FINDINGS  Left Ventricle: Left ventricular ejection fraction, by estimation, is 40 to 45%. The left ventricle has mildly decreased function. The left ventricle demonstrates global hypokinesis. The left ventricular internal cavity size was normal in size. There is  moderate concentric left ventricular hypertrophy. Left ventricular diastolic parameters are indeterminate. Right Ventricle: The right ventricular size is not well visualized. No increase

## 2023-03-17 NOTE — NC FL2 (Signed)
[place] MEDICAID FL2 LEVEL OF CARE FORM     IDENTIFICATION  [name] Birthdate: 1951/05/24 Sex: female Admission Date (Current Location): 03/22/2023  [facility] [place] and [place] Number:  Producer, television/film/video and Address:  [facility] [place], [address] [place], [place] [address]      Provider Number: [id number]  Attending Physician Name and Address:  Zigmund Daniel., *  Relative Name and Phone Number:  [name] (daughter) [phone]    Current Level of Care: Hospital Recommended Level of Care: Skilled Nursing Facility Prior Approval Number:    Date Approved/Denied:   PASRR Number: [id number]  Discharge Plan: SNF    Current Diagnoses: Patient Active Problem List   Diagnosis Date Noted   Cerebrovascular accident (CVA) due to thrombosis of right cerebellar artery (HCC) 03/13/2023   Displaced intertrochanteric fracture of left femur, initial encounter for closed fracture (HCC) 03/11/2023   QT prolongation 01/17/2023   Nausea & vomiting 01/15/2023   Hidradenitis 11/20/2022   Muscle weakness (generalized) 11/20/2022   Need for assistance with personal care 11/20/2022   Other acute osteomyelitis, right ankle and foot (HCC) 11/20/2022   Transient neurologic deficit 10/20/2022   Electrolyte abnormality 10/19/2022   Adjustment disorder 10/18/2022   Hypotension 10/17/2022   Hyponatremia 10/17/2022   Malnutrition of moderate degree 08/05/2022   NSTEMI (non-ST elevated myocardial infarction) (HCC) 07/29/2022   PAC (premature atrial contraction) 07/29/2022   Hyperlipidemia with target LDL less than 70 07/29/2022   Osteomyelitis, unspecified (HCC) 12/10/2021   Transient altered mental status 07/27/2020   Hypercalcemia 02/01/2020   Major depressive disorder, recurrent, unspecified (HCC) 01/25/2020   COVID-19 01/10/2020   Hypoxia 01/10/2020   History of pulmonary embolism 10/01/2019   Prolonged QT interval 10/01/2019   Hypertensive  urgency    Other headache syndrome    Orthostatic dizziness 04/15/2019   Bradycardia 04/15/2019   Syncope    ESRD (end stage renal disease) (HCC)    Iron deficiency anemia, unspecified 10/20/2016   Diabetic Charct's arthropathy (HCC) 10/07/2016   GERD (gastroesophageal reflux disease) 09/09/2016   Depression 09/09/2016   Congestive heart failure (CHF) (HCC) 09/09/2016   Elevated troponin 09/09/2016   Hyperlipidemia associated with type 2 diabetes mellitus (HCC) 03/26/2015   Charcot ankle 03/16/2015   Anemia, chronic renal failure    NSVT (nonsustained ventricular tachycardia) (HCC)    Hypertension due to end stage renal disease caused by type 2 diabetes mellitus, on dialysis (HCC) 12/09/2014   Bipolar affective disorder (HCC) 12/09/2014   Diabetes mellitus type 2, insulin dependent (HCC) 11/17/2014   CAD (coronary artery disease), native coronary artery with 2 stents  11/17/2014    Orientation RESPIRATION BLADDER Height & Weight     Self  O2 (Nasal Cannula 1 liter) Incontinent Weight: 117 lb 4.6 oz (53.2 kg) Height:  5' 4.02" (162.6 cm)  BEHAVIORAL SYMPTOMS/MOOD NEUROLOGICAL BOWEL NUTRITION STATUS      Incontinent Diet (Please see discharge summary)  AMBULATORY STATUS COMMUNICATION OF NEEDS Skin   Extensive Assist Verbally (Difficulty speaking) Other (Comment) (Wound/Incision LDAs,Incision Closed,Thigh Left,compression wrap,dressing in place)                       Personal Care Assistance Level of Assistance  Bathing, Feeding, Dressing Bathing Assistance: Maximum assistance Feeding assistance: Maximum assistance Dressing Assistance: Maximum assistance     Functional Limitations Info  Sight, Hearing, Speech Sight Info: Impaired Hearing Info: Adequate Speech Info: Impaired (Difficulty speaking)  SPECIAL CARE FACTORS FREQUENCY  PT (By licensed PT), OT (By licensed OT)     PT Frequency: 5x min weekly OT Frequency: 5x min weekly            Contractures  Contractures Info: Not present    Additional Factors Info  Code Status, Allergies, Insulin Sliding Scale Code Status Info: DNR Allergies Info: NKA   Insulin Sliding Scale Info: insulin aspart (novoLOG) injection 0-5 Units daily at bedtime,insulin aspart (novoLOG) injection 0-6 Units 3 times daily with meals       Current Medications (03/17/2023):  This is the current hospital active medication list Current Facility-Administered Medications  Medication Dose Route Frequency Provider Last Rate Last Admin   acetaminophen (TYLENOL) tablet 650 mg  650 mg Oral Q6H PRN London Sheer, MD   650 mg at 03/15/23 0828   albuterol (PROVENTIL) (2.5 MG/3ML) 0.083% nebulizer solution 3 mL  3 mL Inhalation Q6H PRN London Sheer, MD       atorvastatin (LIPITOR) tablet 40 mg  40 mg Oral Daily London Sheer, MD   40 mg at 03/15/23 9604   Chlorhexidine Gluconate Cloth 2 % PADS 6 each  6 each Topical Q0600 London Sheer, MD   6 each at 03/17/23 0525   clopidogrel (PLAVIX) tablet 75 mg  75 mg Oral Daily Berton Mount I, MD       dextrose 10 % infusion   Intravenous Continuous Zigmund Daniel., MD 50 mL/hr at 03/17/23 1035 New Bag at 03/17/23 1035   doxercalciferol (HECTOROL) injection 5 mcg  5 mcg Intravenous Q M,W,F-HD London Sheer, MD   5 mcg at 03/16/23 1213   heparin ADULT infusion 100 units/mL (25000 units/227mL)  700 Units/hr Intravenous Continuous Zigmund Daniel., MD 7 mL/hr at 03/16/23 2350 700 Units/hr at 03/16/23 2350   labetalol (NORMODYNE) injection 5 mg  5 mg Intravenous Q2H PRN London Sheer, MD       lacosamide (VIMPAT) 150 mg in sodium chloride 0.9 % 25 mL IVPB  150 mg Intravenous Q12H [name], MD 80 mL/hr at 03/17/23 0528 150 mg at 03/17/23 0528   levETIRAcetam (KEPPRA) IVPB 1000 mg/100 mL premix  1,000 mg Intravenous Q24H [name], MD       And   levETIRAcetam (KEPPRA) IVPB 500 mg/100 mL premix  500 mg Intravenous Q M,W,F [name], MD    Stopped at 03/16/23 1739   melatonin tablet 5 mg  5 mg Oral QHS PRN London Sheer, MD       metoprolol tartrate (LOPRESSOR) injection 2.5 mg  2.5 mg Intravenous Q6H Chandrasekhar, Mahesh A, MD   2.5 mg at 03/17/23 0525   metoprolol tartrate (LOPRESSOR) injection 5 mg  5 mg Intravenous Q5 min PRN Zigmund Daniel., MD   5 mg at 03/16/23 1204   multivitamin (RENA-VIT) tablet 1 tablet  1 tablet Oral QHS London Sheer, MD   1 tablet at 03/13/2023 2313   oxyCODONE (Oxy IR/ROXICODONE) immediate release tablet 2.5-5 mg  2.5-5 mg Oral Q6H PRN London Sheer, MD   2.5 mg at 03/15/23 0829   pantoprazole (PROTONIX) EC tablet 40 mg  40 mg Oral QHS London Sheer, MD   40 mg at 03/13/2023 2314   PHENObarbital (LUMINAL) injection 65 mg  65 mg Intravenous QHS [name], MD       polyethylene glycol (MIRALAX / GLYCOLAX) packet 17 g  17 g Oral  Daily PRN London Sheer, MD       prochlorperazine (COMPAZINE) injection 5 mg  5 mg Intravenous Q6H PRN London Sheer, MD       sevelamer carbonate (RENVELA) tablet 1,600 mg  1,600 mg Oral TID WC Penninger, Lillia Abed, PA       tranexamic acid (CYKLOKAPRON) IVPB 1,000 mg  1,000 mg Intravenous Once London Sheer, MD       valproate (DEPACON) 500 mg in dextrose 5 % 50 mL IVPB  500 mg Intravenous Q6H [name], MD         Discharge Medications: Please see discharge summary for a list of discharge medications.  Relevant Imaging Results:  Relevant Lab Results:   Additional Information [id number] HD at [facility] on MWF with 5:40 am chair time.  Delilah Shan, LCSWA

## 2023-03-17 NOTE — Progress Notes (Signed)
PHARMACY - ANTICOAGULATION CONSULT NOTE  Pharmacy Consult for heparin Indication: atrial fibrillation (PTA Eliquis)  No Known Allergies  Patient Measurements: Height: 5' 4.02" (162.6 cm) Weight: 53.2 kg (117 lb 4.6 oz) IBW/kg (Calculated) : 54.74 Heparin Dosing Weight: 52.9 kg  Vital Signs: Temp: 97.9 F (36.6 C) (10/22 0410) Temp Source: Axillary (10/22 0410) BP: 125/81 (10/22 0712) Pulse Rate: 75 (10/22 0712)  Labs: Recent Labs    03/15/23 0930 03/16/23 0456 03/16/23 1446 03/17/23 0627  HGB 10.1* 8.4* 9.1* 7.5*  HCT 31.7* 26.0* 28.7* 23.8*  PLT 154 155  --  164  HEPARINUNFRC  --   --   --  0.31  CREATININE 6.17* 7.38*  --  5.25*    Estimated Creatinine Clearance: 8.4 mL/min (A) (by C-G formula based on SCr of 5.25 mg/dL (H)).   Medical History: Past Medical History:  Diagnosis Date   Acute delirium 11/18/2014   Acute encephalopathy    Acute on chronic respiratory failure with hypoxia (HCC) 09/09/2016   Acute respiratory failure with hypoxia (HCC) 11/29/2015   Anemia in chronic kidney disease 12/09/2014   Anxiety    Arthritis    Benign hypertension    Bipolar affective disorder (HCC) 12/09/2014   CAD (coronary artery disease), native coronary artery with 2 stents  11/17/2014   Charcot foot due to diabetes mellitus (HCC)    Chronic combined systolic and diastolic CHF (congestive heart failure) (HCC) 11/18/2014   Closed left ankle fracture 11/17/2014   Confusion 01/21/2015   Depression    Diabetes mellitus without complication (HCC)    End stage renal disease on dialysis (HCC)    Fracture dislocation of ankle 11/17/2014   GERD (gastroesophageal reflux disease)    Heart murmur    History of blood transfusion    History of bronchitis    History of pneumonia    Hyperlipidemia 03/26/2015   Hypertension associated with diabetes (HCC) 11/18/2014   Hypertensive heart/renal disease with failure (HCC) 12/09/2014   Hypokalemia 11/17/2014   Multiple falls 01/21/2015   Nausea  & vomiting 01/15/2023   Obesity 11/17/2014   Onychomycosis 10/07/2016   Right leg DVT (HCC) 12/05/2015   SIRS (systemic inflammatory response syndrome) (HCC) 08/08/2016   Sleep apnea    Unstable angina (HCC) 12/26/2017   Ventricular tachycardia [facility])     Assessment: 72 yo female with PMH of DVT in 2017 and Afib on Eliquis (last dose PTA, 10/17 AM) who presented 10/17 with stroke confirmed on CT head / MRI and now s/p L hip replacement 10/19.  Head CT 10/20 with no acute hemorrhage.  Pharmacy consulted for heparin dosing until able to tolerate PO.  Heparin level 0.31 on lower end of therapeutic range.  Hgb 7.5, pltc 164.  No infusion issues or bleeding per RN.  Goal of Therapy:  Heparin level 0.3-0.5 units/ml, no bolus Monitor platelets by anticoagulation protocol: Yes   Plan:  Continue heparin 700 units/hr, no bolus Daily heparin level, CBC  F/u ability to transition to Eliquis pending PO  Trixie Rude, PharmD Clinical Pharmacist 03/17/2023  8:15 AM

## 2023-03-17 NOTE — Consult Note (Addendum)
Consultation Note Date: 03/17/2023   [name]  DOB: July 04, 1950  MRN: [id number]  Age / Sex: 72 y.o., female  [facility]P: [facility] Referring Physician: Zigmund Daniel., *  Reason for Consultation: Establishing goals of care  HPI/Patient Profile: 72 y.o. female  with past medical history of ESRD on HD MWF, coronary artery disease status post PCI with stent placement March 2024, HFrEF 45 to 50%, type 2 diabetes, hypertension, bipolar disorder, DVT, admitted on Apr 04, 2023 with fall at home.   In the ED, workup revealed left intertrochanteric hip fracture seen on x-ray.  She underwent surgical repair and was also found to have an acute to early subacute ischemic stroke on arrival.  Unfortunately, she developed seizures/status epilepticus after surgery, as well as A-fib with RVR and dysphagia. PMT has been consulted to assist with goals of care conversation.  Clinical Assessment and Goals of Care:  I have reviewed medical records including EPIC notes, labs and imaging, assessed the patient and then met at the bedside with patient's daughter/HCPOA Dois Davenport to discuss diagnosis prognosis, GOC, EOL wishes, disposition and options.  I introduced Palliative Medicine as specialized medical care for people living with serious illness. It focuses on providing relief from the symptoms and stress of a serious illness. The goal is to improve quality of life for both the patient and the family.  We discussed a brief life review of the patient and then focused on their current illness.  The natural disease trajectory and expectations at EOL were discussed.  I attempted to elicit values and goals of care important to the patient.    Medical History Review and Understanding:  We reviewed patient's acute illness in the context of her chronic comorbidities.  Discussed patient's recent hospitalization in  August and her uncertain prognosis.  Patient's daughter has a good understanding severity of her illness.  Social History: Patient has 3 children of which daughter [name] is the primary Education officer, environmental.  She is also supported by her grandchildren, [name]'s sons.  She was living at home with them.  Functional and Nutritional State: Patient's daughter is concerned she would not be able to care for her in her current functional state.  They were already arranging nearly 24/7 care, including family and in-home caregiver.  Patient was using a walker at baseline, though sometimes would refuse. Albumin of 2.2 noted.  Palliative Symptoms: Agitation/anxiety with underlying psychiatric illnesses, currently unable to take medications due to n.p.o. status  Advance Directives: A detailed discussion regarding advanced directives was had.  Reviewed advanced directive on file in [facility].   Code Status: Concepts specific to code status, artifical feeding and hydration, and rehospitalization were considered and discussed.  Patient's daughter would not want aggressive interventions to cause patient more suffering.  Discussion: Patient's daughter [name] shares that patient was followed by palliative care both at the [place] and with [facility].  After recent hospitalization in August, [name] had a difficult time processing fluctuations in expected prognosis.  Hospice was introduced at that time and she thought that patient would only have days to live, until patient was able to actually dress herself and ambulate around the home again.  [name] states "I know she does not have long" and becomes emotional thinking about this.  Emotional support and therapeutic listening was provided.   [name] is committed to honoring patient's wishes and if patient wishes to continue interventions such as dialysis, she would do so.  However, if patient was able to state  something similar to "I am done with all this," then she would  respect that as well.  She would not want to continue with life-prolonging interventions if patient does not show an indication that she is "still in there" cognitively.  To [name], this would involve patient's alertness improving and stating "I want to go home."   Counseled on the differences between palliative care and hospice particularly with dialysis in mind, as this would not be aligned with a comfort focused path.  She verbalized understanding.  We also discussed possibility of upcoming medical decisions to be made including temporary artificial nutrition.  She is agreeable to ongoing goals of care discussions based on additional diagnostics and patient's progress or lack thereof.   The difference between aggressive medical intervention and comfort care was considered in light of the patient's goals of care. Hospice and Palliative Care services outpatient were explained and offered.   Discussed the importance of continued conversation with family and the medical providers regarding overall plan of care and treatment options, ensuring decisions are within the context of the patient's values and GOCs.   Questions and concerns were addressed.  Hard Choices booklet left for review. The family was encouraged to call with questions or concerns.  PMT will continue to support holistically.    SUMMARY OF RECOMMENDATIONS   -Continue DNR/DNI -Continue treating the treatable with gentle supportive care -Ongoing goals of care discussions pending clinical course -Psychosocial and emotional support provided -PMT will continue to follow and support  Prognosis:  Guarded to poor given several chronic comorbidities and rapid functional/nutritional/cognitive decline  Discharge Planning: Skilled Nursing Facility for rehab with Palliative care service follow-up      Primary Diagnoses: Present on Admission:  Displaced intertrochanteric fracture of left femur, initial encounter for closed fracture  [facility])   Physical Exam Vitals and nursing note reviewed.  Constitutional:      Appearance: She is ill-appearing.  Cardiovascular:     Rate and Rhythm: Normal rate.  Pulmonary:     Effort: Pulmonary effort is normal.  Neurological:     Mental Status: She is lethargic.     Motor: Weakness present.  Psychiatric:        Cognition and Memory: Cognition is impaired.    Vital Signs: BP 125/81 (BP Location: Left Arm)   Pulse 75   Temp 97.9 F (36.6 C) (Axillary)   Resp 18   Ht 5' 4.02" (1.626 m)   Wt 53.2 kg   SpO2 100%   BMI 20.12 kg/m  Pain Scale: Faces POSS *See Group Information*: S-Acceptable,Sleep, easy to arouse Pain Score: 0-No pain   SpO2: SpO2: 100 % O2 Device:SpO2: 100 % O2 Flow Rate: .O2 Flow Rate (L/min): 1 L/min (transferred to room aair at this time)   Palliative Assessment/Data: 10% (currently n.p.o.)     Total time: I spent 75 minutes in the care of the patient today in the above activities and documenting the encounter.  MDM: High   Kaelem Brach Jeni Salles, PA-C  Palliative Medicine Team Team phone # [phone]  Thank you for allowing the Palliative Medicine Team to assist in the care of this patient. Please utilize secure chat with additional questions, if there is no response within 30 minutes please call the above phone number.  Palliative Medicine Team providers are available by phone from 7am to 7pm daily and can be reached through the team cell phone.  Should this patient require assistance outside of these hours, please call the patient's attending physician.

## 2023-03-17 NOTE — Progress Notes (Signed)
Subjective: No acute events overnight.  Daughter at bedside.  ROS: Unable to obtain due to poor mental status  Examination  Vital signs in last 24 hours: Temp:  [97 F (36.1 C)-97.9 F (36.6 C)] 97.9 F (36.6 C) (10/22 0410) Pulse Rate:  [47-144] 75 (10/22 0712) Resp:  [15-29] 18 (10/22 0712) BP: (111-157)/(52-118) 125/81 (10/22 0712) SpO2:  [95 %-100 %] 100 % (10/22 0800)  General: lying in bed, NAD Neuro: Briefly opens eyes to repeated tactile and verbal stimulation, did not answer any questions, did not follow any commands, pupils appear equally round and reactive without any forced gaze deviation, does look at examiner but quickly closes eyes so difficult to appreciate extraocular movements, does appear to have slightly increased tone in bilateral upper extremities but is spontaneously moving all 4 extremities with antigravity strength  Basic Metabolic Panel: Recent Labs  Lab 03/13/23 1239 03/13/23 1300 03/14/23 0448 03/15/23 0930 03/16/23 0456 03/17/23 0627  NA  --  140 135 133* 136 139  K  --  4.4 3.8 5.1 4.0 3.5  CL  --  97* 88* 90* 94* 98  CO2  --  25 17* 19* 23 24  GLUCOSE  --  112* 252* 226* 97 64*  BUN  --  23 16 36* 44* 30*  CREATININE  --  6.66* 4.20* 6.17* 7.38* 5.25*  CALCIUM  --  9.5 10.0 9.7 9.5 8.9  MG  --   --  2.0  --   --  1.9  PHOS 6.7* 6.7* 7.0* 7.5*  --  6.5*    CBC: Recent Labs  Lab 03/12/23 1702 03/12/23 1813 03/13/23 1300 03/14/23 0448 03/15/23 0930 03/16/23 0456 03/16/23 1446 03/17/23 0627  WBC 8.4   < > 9.4 12.4* 12.1* 8.4  --  5.4  NEUTROABS 6.8  --   --   --   --   --   --   --   HGB 10.0*   < > 10.4* 12.1 10.1* 8.4* 9.1* 7.5*  HCT 31.4*   < > 32.2* 37.0 31.7* 26.0* 28.7* 23.8*  MCV 96.0   < > 97.0 95.1 95.8 95.9  --  96.4  PLT 178   < > 180 185 154 155  --  164   < > = values in this interval not displayed.     Coagulation Studies: No results for input(s): "LABPROT", "INR" in the last 72 hours.  Imaging No new brain  imaging overnight  ASSESSMENT AND PLAN: 72 year old female with history of hypertension, diabetes, hyperlipidemia, coronary artery disease, end-stage renal disease on hemodialysis (Monday Wednesday Friday), A-fib on Eliquis who had a fall at home and subsequently had left hip fracture.  While in the hospital was also found to have right cerebellar infarct and has seen by stroke team.  However yesterday patient was noted to have right facial twitching and upper extremity tremor-like movements.  EEG was obtained which showed generalized spikes and polyspikes.  Therefore patient was started on Depakote.  Long-term EEG continued to show status epilepticus.  Vimpat and subsequently Keppra was added.  However, status epilepticus has not resolved.   Status epilepticus, resolved -Reportedly patient has history of PEA arrest in February 2023.  With the current EEG pattern, it is possible that patient had seizure recurrence due to lowered seizure threshold in the setting of hip fracture and subsequent management   Recommendations -Status epilepticus resolved after phenobarbital yesterday. -Recommend continuing phenobarbital 65 mg nightly -Continue Vimpat 150 mg twice daily, Keppra  1000 mg daily with an additional 500 mg dose after dialysis on Monday Wednesday Friday -Reduce Depakote to 500 mg every 6 hours with plans to gradually stop -Continue LTM EEG for now.  Will consider discontinuing after 24 to 48 hours -Given patient's age, history of cardiac arrest, seizures and other comorbidities as well as poor neurocognitive reserve, it might take patient prolonged time to gradually improve.  In the meantime would recommend continuing delirium precautions and minimizing any other sedating meds -Continue seizure precautions -Discussed plan with daughter at bedside -Updated Dr. Lowell Guitar about the plan by secure chat  I have spent a total of 36   minutes with the patient reviewing hospital notes,  test results, labs  and examining the patient as well as establishing an assessment and plan that was discussed personally with the patient.  > 50% of time was spent in direct patient care.     Lindie Spruce Epilepsy [facility] For questions after 5pm please refer to AMION to reach the Neurologist on call

## 2023-03-17 NOTE — Progress Notes (Addendum)
PROGRESS NOTE    Robin [name]  GYI:[id number] DOB: 1951-04-14 DOA: 03/01/2023 PCP: [facility]  Chief Complaint  Patient presents with   Fall    Brief Narrative:   Robin [name] is Robin [name] 72 y.o. female with medical history significant for ESRD on HD MWF, coronary artery disease status post PCI with stent placement March 2024, HFrEF 45 to 50% (07/29/2022) type 2 diabetes, hypertension, recently admitted by family medicine teaching service at [facility] for chest pain, who presents with Robin [name] fall at home.  The patient reports she lost her balance in her kitchen and fell backwards landing on her left hip and hitting the back of her head on the hardwood floor.  No loss of consciousness.  EMS was activated.  Upon arrival to the emergency room MRI of the brain also showed acute to early subacute ischemic stroke and neurology is following.  X-ray of the left femur showed left intertrochanteric hip fracture.  Neurologist and orthopedics on board.  She's now s/p L intertrochanteric IM rodding 03-20-23.  Neurology recommending anticoagulation and plavix for her R cerebellar peduncle infarct (thought due to small vessel disease).  Her hospitalization has been complicated by status epilepticus.  Neurology is following.  Cardiology is also following for Zandon Talton fib.  See below for additional details    Assessment & Plan:   Principal Problem:   Displaced intertrochanteric fracture of left femur, initial encounter for closed fracture [facility]) Active Problems:   Cerebrovascular accident (CVA) due to thrombosis of right cerebellar artery (HCC)  Status Epilepticus  Facial and bilateral upper extremity twitching/tremors: EEG today with evidence of status epilpeticus Appreciate neurology recommendations -> status resolved after phenobarb 10/21.  Recommend phenobarb 65 mg nightly.  Keppra 1000 mg daily with extra 500 mg dose after dialysis MWF, vimpat 150 mg BID.  Reduce depakote to 500 mg q6 with plan to gradually stop.   Continue LTM EEG.  Per neurology, it might take prolonged time for improvement.  Recommending delirium precautions and minimizing sedating meds.  Repeat MRI per neurology.      Acute to early subacute infarct in the right brachium pontis Noted on MRI brain 10/17 Echo with EF 40-45%, global hypokinesis, RVSF mildly reduced Carotid US with 1-39% stenosis bilaterally, antegrade bilateral vertebral artery flow  Appreciate neurology recs - likely due to small vessel disease -> resume AC and plavix, atorvastatin LDL 105, A1c 7.7 PT/OT/SLP  Dysphagia Due to above  Currently NPO, follow for improvement -> daughter asking about diet today.  Discussed plan to trial cortrak while neurology treats her seizures and adjusts medications for sedation.  Discussed we should continue to evaluate appropriateness of continuing this based on her clinical course.   Atrial Fibrillation with RVR Heparin gtt, metoprolol IV scheduled and prn (for sustained HR >120)  Close left hip fracture status post mechanical fall, POA S/p L intertrochanteric fracture IM rodding 03/20/23 PT/OT when able to participate  Per ortho, ok to resume home antiplatelets and anticoagulation    Type 2 diabetes with hyperglycemia A1c 7.7  Hold SSI with hypoglycemia - d10 infusion    Coronary artery disease status post PCI with stenting Plavix (once able to take PO), heparin gtt   History of thromboembolism DVT, chronic thromboembolic pulmonary hypertension Heparin gtt   ESRD on HD MWF Appreciate renal assistance   Hyperphosphatemia/CKD-MBD: Appreciate renal assistance   Bipolar disorder Hold home abilify (resume once mental status improved and can safely take PO)   Hypertension Holding antihypertensives, unable  to take PO    HFrEF 45% -Euvolemic on exam -Echocardiogram done in March 2024 revealed estimated EF of 45 to 50%, grade 2 diastolic dysfunction and impaired right ventricular function. -not able to safely take PO at  this time, meds on hold -strict I/O, daily weights   Paroxysmal atrial fibrillation with RVR: -currently on scheduled IV metop with prn IV metop for RVR -appreciate cards recs -> recommending transitioning to oral metop 25 mg daily when tolerating orals (see 10/22 note from dr. Scharlene Gloss)  Severe Protein Calorie Malnutrition Body mass index is 20.12 kg/m.  Goals of care Appreciate palliative assistance    DVT prophylaxis: heparin gtt Code Status: DNR Family Communication: daugther Disposition:   Status is: Inpatient Remains inpatient appropriate because: need for continued inpatient care   Consultants:  Ortho Cardiology Neurology Renal   Procedures:   10/19 Left intertrochanteric fracture intramedullary rodding  Antimicrobials:  Anti-infectives (From admission, onward)    Start     Dose/Rate Route Frequency Ordered Stop   03/23/2023 1400  ceFAZolin (ANCEF) IVPB 2g/100 mL premix        2 g 200 mL/hr over 30 Minutes Intravenous Every 8 hours 03/07/2023 1118 03/15/23 0001   03/19/2023 0715  ceFAZolin (ANCEF) IVPB 2g/100 mL premix        2 g 200 mL/hr over 30 Minutes Intravenous To Surgery 03/20/2023 0713 03/19/2023 0746   03/18/2023 0646  ceFAZolin (ANCEF) 2-4 GM/100ML-% IVPB       Note to Pharmacy: Crissie Sickles: cabinet override      02/25/2023 0646 03/24/2023 0823   03/13/23 0945  ceFAZolin (ANCEF) IVPB 2g/100 mL premix  Status:  Discontinued        2 g 200 mL/hr over 30 Minutes Intravenous To Surgery 03/13/23 0850 03/04/2023 0713       Subjective: Doesn't speak Daughter at bedside  Objective: Vitals:   03/17/23 0712 03/17/23 0800 03/17/23 1141 03/17/23 1619  BP: 125/81  137/77 128/63  Pulse: 75  70 77  Resp: 18  18 16   Temp:   (!) 97.2 F (36.2 C) 98.1 F (36.7 C)  TempSrc:   Axillary Axillary  SpO2: 100% 100% 98%   Weight:      Height:        Intake/Output Summary (Last 24 hours) at 03/17/2023 1736 Last data filed at 03/17/2023 0407 Gross per 24 hour  Intake  379.57 ml  Output --  Net 379.57 ml   Filed Weights   03/13/23 1731 02/27/2023 0713 03/16/23 0846  Weight: 52.9 kg 52.9 kg 53.2 kg    Examination:  General: ill appearing Cardiovascular: RRR Lungs: unlabored Neurological: opens eyes to voice, but doesn't follow commands Extremities: No clubbing or cyanosis. No edema.  Data Reviewed: I have personally reviewed following labs and imaging studies  CBC: Recent Labs  Lab 03/03/2023 1702 03/13/2023 1813 03/13/23 1300 03/06/2023 0448 03/15/23 0930 03/16/23 0456 03/16/23 1446 03/17/23 0627  WBC 8.4   < > 9.4 12.4* 12.1* 8.4  --  5.4  NEUTROABS 6.8  --   --   --   --   --   --   --   HGB 10.0*   < > 10.4* 12.1 10.1* 8.4* 9.1* 7.5*  HCT 31.4*   < > 32.2* 37.0 31.7* 26.0* 28.7* 23.8*  MCV 96.0   < > 97.0 95.1 95.8 95.9  --  96.4  PLT 178   < > 180 185 154 155  --  164   < > =  values in this interval not displayed.    Basic Metabolic Panel: Recent Labs  Lab 03/13/23 1239 03/13/23 1300 Apr 05, 2023 0448 03/15/23 0930 03/16/23 0456 03/17/23 0627  NA  --  140 135 133* 136 139  K  --  4.4 3.8 5.1 4.0 3.5  CL  --  97* 88* 90* 94* 98  CO2  --  25 17* 19* 23 24  GLUCOSE  --  112* 252* 226* 97 64*  BUN  --  23 16 36* 44* 30*  CREATININE  --  6.66* 4.20* 6.17* 7.38* 5.25*  CALCIUM  --  9.5 10.0 9.7 9.5 8.9  MG  --   --  2.0  --   --  1.9  PHOS 6.7* 6.7* 7.0* 7.5*  --  6.5*    GFR: Estimated Creatinine Clearance: 8.4 mL/min (Acea Yagi) (by C-G formula based on SCr of 5.25 mg/dL (H)).  Liver Function Tests: Recent Labs  Lab 03/13/23 1238 03/13/23 1300 03/15/23 0930 03/16/23 0456 03/17/23 0627  AST  --   --   --  19 17  ALT  --   --   --  <5 <5  ALKPHOS  --   --   --  79 70  BILITOT  --   --   --  0.7 0.6  PROT  --   --   --  5.9* 5.7*  ALBUMIN 3.1* 3.1* 3.0* 2.4* 2.2*    CBG: Recent Labs  Lab 03/16/23 2225 03/16/23 2251 03/17/23 0735 03/17/23 1131 03/17/23 1617  GLUCAP 68* 146* 61* 115* 64*     No results found for  this or any previous visit (from the past 240 hour(s)).       Radiology Studies: Overnight EEG with video  Result Date: 03/16/2023 Robin Quest, MD     03/17/2023  8:52 AM [name] MRN: [id number] Epilepsy Attending: Charlsie [name] Referring Physician/Provider: [name], MD Duration: 03/15/2023 1229 to 03/16/2023 1229  Patient history: 72yo F with seizure like activity getting eeg to evaluate for seizure  Level of alertness: Awake/ lethargic  AEDs during EEG study: VPA, LCM, LEV  Technical aspects: This EEG study was done with scalp electrodes positioned according to the 10-20 International system of electrode placement. Electrical activity was reviewed with band pass filter of 1-70Hz , sensitivity of 7 uV/mm, display speed of 64mm/sec with Solina Heron 60Hz  notched filter applied as appropriate. EEG data were recorded continuously and digitally stored.  Video monitoring was available and reviewed as appropriate.  Description: EEG showed continuous generalized  3 to 6 Hz theta-delta slowing. Generalized polyspikes and fast activity lasting 1-3 seconds was noted every 1-2 seconds.  Clinically patient was noted to have tremor-like activity in bilateral upper extremity at times but it was difficult to see if this was time locked with the polyspikes.  Patient was loaded with valproic acid and subsequently with lacosamide without significant improvement in EEG.  Eventually patient was loaded with levetiracetam on 03/15/2023 at around midnight.  However, EEG continued to show continuous generalized 3 to 5 Hz theta-delta slowing admixed with generalized spike and wave as well as polyspikes which appeared near continuous. Hyperventilation and photic stimulation were not performed.    ABNORMALITY - Status epilepticus, generalized - Continuous slow, generalized  IMPRESSION: This study showed evidence of status epilepticus with generalized onset.  Additionally there is moderate to severe diffuse  encephalopathy.  [name]  CT HEAD WO CONTRAST ( )  Result Date: 03/15/2023 CLINICAL  DATA:  Stroke, follow-up EXAM: CT HEAD WITHOUT CONTRAST TECHNIQUE: Contiguous axial images were obtained from the base of the skull through the vertex without intravenous contrast. RADIATION DOSE REDUCTION: This exam was performed according to the departmental dose-optimization program which includes automated exposure control, adjustment of the mA and/or kV according to patient size and/or use of iterative reconstruction technique. COMPARISON:  03/17/2023 CT head and MRI head FINDINGS: Brain: Redemonstrated hypodensity in right brachium pontis, which correlates with the acute infarct noted on the prior MRI, with new hypodensity in the right greater than left aspect of the pons (series 3, image 10), which may be artifactual. No evidence of acute hemorrhage, mass, mass effect, or midline shift. No hydrocephalus or extra-axial fluid collection. Vascular: No hyperdense vessel. Redemonstrated bilateral pipeline stents. Atherosclerotic calcifications in the vertebral arteries. Skull: Negative for fracture or focal lesion. Sinuses/Orbits: No acute finding. Other: The mastoid air cells are well aerated. IMPRESSION: 1. New hypodensity in the right greater than left aspect of the pons, which may be artifactual. Consider MRI for further evaluation. 2. 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 No acute intracranial hemorrhage. These results will be called to the ordering clinician or representative by the Radiologist Assistant, and communication documented in the PACS or [employer]. Electronically Signed   By: Wiliam Ke M.D.   On: 03/15/2023 19:51        Scheduled Meds:  atorvastatin  40 mg Oral Daily   Chlorhexidine Gluconate Cloth  6 each Topical Q0600   clopidogrel  75 mg Oral Daily   doxercalciferol  5 mcg Intravenous Q M,W,F-HD   metoprolol tartrate  2.5 mg Intravenous Q6H    multivitamin  1 tablet Oral QHS   pantoprazole  40 mg Oral QHS   PHENObarbital  65 mg Intravenous QHS   sevelamer carbonate  1,600 mg Oral TID WC   Continuous Infusions:  dextrose 50 mL/hr at 03/17/23 1035   heparin 700 Units/hr (03/16/23 2350)   lacosamide (VIMPAT) IV 150 mg (03/17/23 0528)   levETIRAcetam 1,000 mg (03/17/23 1322)   And   levETIRAcetam Stopped (03/16/23 1739)   tranexamic acid     valproate sodium 500 mg (03/17/23 1429)     LOS: 5 days    Time spent: over 30 min    Lacretia Nicks, MD [facility]   To contact the attending provider between 7A-7P or the covering provider during after hours 7P-7A, please log into the web site [email or web address] and access using universal [place] password for that web site. If you do not have the password, please call the hospital operator.  03/17/2023, 5:36 PM

## 2023-03-17 NOTE — Progress Notes (Signed)
SLP Cancellation Note  Patient Details Name: Robin [name] MRN: [id number] DOB: 1951-02-11   Cancelled treatment:       Reason Eval/Treat Not Completed: Patient's level of consciousness. Per review of notes in chart, patient has not been adequately alert today. SLP will continue to follow for readiness.  Angela Nevin, MA, CCC-SLP Speech Therapy

## 2023-03-17 NOTE — Procedures (Signed)
[name]  MRN: [id number]  Epilepsy Attending: [name]  Referring Physician/Provider: [name], MD  Duration: 03/16/2023 1229 to 03/17/2023 1229   Patient history: 72yo F with seizure like activity getting eeg to evaluate for seizure   Level of alertness: Awake/ lethargic    AEDs during EEG study: VPA, LCM, LEV, Phenobarb   Technical aspects: This EEG study was done with scalp electrodes positioned according to the 10-20 International system of electrode placement. Electrical activity was reviewed with band pass filter of 1-70Hz , sensitivity of 7 uV/mm, display speed of 61mm/sec with a 60Hz  notched filter applied as appropriate. EEG data were recorded continuously and digitally stored.  Video monitoring was available and reviewed as appropriate.   Description: At the beginning of the study, EEG showed continuous generalized  3 to 6 Hz theta-delta slowing.Generalized polyspikes and fast activity lasting 1-3 seconds was noted every 1-2 seconds. This EEG pattern was consistent with status epilepticus. After around 5 PM on 03/15/2023 as anti-seizure medications were adjusted, status epilepticus resolved. Subsequently EEG showed continuous generalized 3-5 Hz theta-delta slowing admixed with generalized polyspikes lasting 0.5 to 2 seconds.  Of note, when patient was awake/stimulated, the morphology of slowing appeared more sharply contoured and higher in amplitude. hyperventilation and photic stimulation were not performed.      ABNORMALITY - Status epilepticus, generalized - Continuous slow, generalized   IMPRESSION: This study initially showed evidence of status epilepticus with generalized onset.  After around 5 PM on 03/15/2023 as antiseizure medications were adjusted, status epilepticus resolved.  Subsequently, EEG was consistent with epilepsy with generalized onset. Additionally there was moderate to severe diffuse encephalopathy.     Robin [name]

## 2023-03-17 NOTE — TOC Progression Note (Addendum)
Transition of Care [facility]) - Progression Note    Patient Details  Name: COYLA LUTCHMAN MRN: [id number] Date of Birth: 10-07-1950  Transition of [name]) CM/SW Contact  Delilah Shan, LCSWA Phone Number: 03/17/2023, 12:19 PM  Clinical Narrative:     CSW provided SNF bed offers to patients daughter [name]. [name] accepted SNF bed offer with [facility] as number one choice and [facility] as second choice. CSW called [facility] and informed them of patients HD schedule.[facility] confirmed SNF bed for patient when medically ready for dc. CSW updated patients daughter. CSW informed tracy renal navigator. CSW following to start insurance authorization closer to patient being medically ready.  Expected Discharge Plan: Skilled Nursing Facility Barriers to Discharge: Continued Medical Work up  Expected Discharge Plan and Services In-house Referral: Clinical Social Work     Living arrangements for the past 2 months: Single Family Home                                       Social Determinants of Health (SDOH) Interventions SDOH Screenings   Food Insecurity: No Food Insecurity (01/15/2023)  Housing: Patient Declined (01/15/2023)  Transportation Needs: No Transportation Needs (01/15/2023)  Utilities: Not At Risk (01/15/2023)  Tobacco Use: High Risk (03/14/2023)    Readmission Risk Interventions     No data to display

## 2023-03-17 NOTE — Plan of Care (Signed)

## 2023-03-17 NOTE — Progress Notes (Addendum)
communication documented in the PACS or [employer]. Electronically Signed   By: Wiliam Ke M.D.   On: 03/15/2023 19:51   ECHOCARDIOGRAM COMPLETE BUBBLE STUDY  Result Date: 03/15/2023    ECHOCARDIOGRAM REPORT   Patient Name:   Robin [name] Date of Exam: 03/15/2023 Medical Rec #:  [id number]    Height:       64.0 in Accession #:    [id number]   Weight:       116.6 lb Date of Birth:  [birth year]    BSA:          1.556 m Patient Age:    72 years     BP:           206/187 mmHg Patient Gender: F            HR:           96 bpm. Exam Location:  Inpatient Procedure: 2D Echo, Cardiac Doppler and Color Doppler Indications:    stroke  History:        Patient has prior history of Echocardiogram examinations, most                 recent 07/29/2022. CHF, end stage renal disease, Arrythmias:PAC;                 Risk Factors:Diabetes, Dyslipidemia and Current Smoker.  Sonographer:    Delcie Roch RDCS Referring Phys: [id number] MICHAEL A MOORE IMPRESSIONS  1. Left ventricular ejection fraction, by estimation, is 40 to 45%. The left ventricle has mildly decreased function. The left ventricle demonstrates global hypokinesis. There is moderate concentric left ventricular hypertrophy. Left ventricular diastolic parameters are indeterminate.  2. Right ventricular systolic function is mildly reduced. The right ventricular size is not well visualized. There is normal pulmonary artery systolic pressure.  3. The mitral valve is abnormal. Mild mitral valve regurgitation. No evidence of mitral stenosis. The mean mitral valve gradient is 2.0 mmHg. Moderate mitral annular calcification.  4. The aortic valve is tricuspid. Aortic valve regurgitation is not visualized. Aortic valve sclerosis is present, with no evidence of aortic valve stenosis.  5. The inferior vena  cava is normal in size with greater than 50% respiratory variability, suggesting right atrial pressure of 3 mmHg.  6. Agitated saline contrast bubble study was negative, with no evidence of any interatrial shunt. FINDINGS  Left Ventricle: Left ventricular ejection fraction, by estimation, is 40 to 45%. The left ventricle has mildly decreased function. The left ventricle demonstrates global hypokinesis. The left ventricular internal cavity size was normal in size. There is  moderate concentric left ventricular hypertrophy. Left ventricular diastolic parameters are indeterminate. Right Ventricle: The right ventricular size is not well visualized. No increase in right ventricular wall thickness. Right ventricular systolic function is mildly reduced. There is normal pulmonary artery systolic pressure. The tricuspid regurgitant velocity is 2.77 m/s, and with an assumed right atrial pressure of 3 mmHg, the estimated right ventricular systolic pressure is 33.7 mmHg. Left Atrium: Left atrial size was normal in size. Right Atrium: Right atrial size was normal in size. Pericardium: There is no evidence of pericardial effusion. Mitral Valve: The mitral valve is abnormal. Moderate mitral annular calcification. Mild mitral valve regurgitation. No evidence of mitral valve stenosis. The mean mitral valve gradient is 2.0 mmHg with average heart rate of 74 bpm. Tricuspid Valve: The tricuspid valve is normal in structure. Tricuspid valve regurgitation is mild . No evidence of tricuspid stenosis. Aortic Valve: The aortic valve  communication documented in the PACS or [employer]. Electronically Signed   By: Wiliam Ke M.D.   On: 03/15/2023 19:51   ECHOCARDIOGRAM COMPLETE BUBBLE STUDY  Result Date: 03/15/2023    ECHOCARDIOGRAM REPORT   Patient Name:   Robin [name] Date of Exam: 03/15/2023 Medical Rec #:  [id number]    Height:       64.0 in Accession #:    [id number]   Weight:       116.6 lb Date of Birth:  [birth year]    BSA:          1.556 m Patient Age:    72 years     BP:           206/187 mmHg Patient Gender: F            HR:           96 bpm. Exam Location:  Inpatient Procedure: 2D Echo, Cardiac Doppler and Color Doppler Indications:    stroke  History:        Patient has prior history of Echocardiogram examinations, most                 recent 07/29/2022. CHF, end stage renal disease, Arrythmias:PAC;                 Risk Factors:Diabetes, Dyslipidemia and Current Smoker.  Sonographer:    Delcie Roch RDCS Referring Phys: [id number] MICHAEL A MOORE IMPRESSIONS  1. Left ventricular ejection fraction, by estimation, is 40 to 45%. The left ventricle has mildly decreased function. The left ventricle demonstrates global hypokinesis. There is moderate concentric left ventricular hypertrophy. Left ventricular diastolic parameters are indeterminate.  2. Right ventricular systolic function is mildly reduced. The right ventricular size is not well visualized. There is normal pulmonary artery systolic pressure.  3. The mitral valve is abnormal. Mild mitral valve regurgitation. No evidence of mitral stenosis. The mean mitral valve gradient is 2.0 mmHg. Moderate mitral annular calcification.  4. The aortic valve is tricuspid. Aortic valve regurgitation is not visualized. Aortic valve sclerosis is present, with no evidence of aortic valve stenosis.  5. The inferior vena  cava is normal in size with greater than 50% respiratory variability, suggesting right atrial pressure of 3 mmHg.  6. Agitated saline contrast bubble study was negative, with no evidence of any interatrial shunt. FINDINGS  Left Ventricle: Left ventricular ejection fraction, by estimation, is 40 to 45%. The left ventricle has mildly decreased function. The left ventricle demonstrates global hypokinesis. The left ventricular internal cavity size was normal in size. There is  moderate concentric left ventricular hypertrophy. Left ventricular diastolic parameters are indeterminate. Right Ventricle: The right ventricular size is not well visualized. No increase in right ventricular wall thickness. Right ventricular systolic function is mildly reduced. There is normal pulmonary artery systolic pressure. The tricuspid regurgitant velocity is 2.77 m/s, and with an assumed right atrial pressure of 3 mmHg, the estimated right ventricular systolic pressure is 33.7 mmHg. Left Atrium: Left atrial size was normal in size. Right Atrium: Right atrial size was normal in size. Pericardium: There is no evidence of pericardial effusion. Mitral Valve: The mitral valve is abnormal. Moderate mitral annular calcification. Mild mitral valve regurgitation. No evidence of mitral valve stenosis. The mean mitral valve gradient is 2.0 mmHg with average heart rate of 74 bpm. Tricuspid Valve: The tricuspid valve is normal in structure. Tricuspid valve regurgitation is mild . No evidence of tricuspid stenosis. Aortic Valve: The aortic valve  HGB 10.0*   < > 10.4* 12.1 10.1* 8.4* 9.1* 7.5*  HCT 31.4*   < > 32.2* 37.0 31.7* 26.0* 28.7* 23.8*  MCV 96.0   < > 97.0 95.1 95.8 95.9  --  96.4  PLT 178   < > 180 185 154 155  --  164   < > = values in this interval not displayed.   Cardiac Enzymes: No results for input(s): "CKTOTAL", "CKMB", "CKMBINDEX", "TROPONINI" in the last 168 hours. CBG: Recent Labs  Lab 03/16/23 1313 03/16/23 1626 03/16/23 2225 03/16/23 2251 03/17/23 0735  GLUCAP 83 77 68* 146* 61*    Studies/Results: VAS US CAROTID  Result Date: 03/16/2023 Carotid Arterial Duplex Study Patient Name:  Robin [name]  Date of Exam:   03/15/2023 Medical Rec #: [id number]     Accession #:    [id number] Date of Birth: July 25, 1950     Patient Gender: F Patient Age:   76 years Exam Location:  [facility] Procedure:      VAS US CAROTID Referring Phys: Willia Craze  --------------------------------------------------------------------------------  Indications:       CVA and Fall, hip fracture, s/p IM repair. Risk Factors:      Hypertension, hyperlipidemia, Diabetes, coronary artery                    disease. Other Factors:     ESRD, on dialysis. Limitations        Today's exam was limited due to the patient's inability or                    unwillingness to cooperate and the body habitus of the                    patient. Comparison Study:  No prior study Performing Technologist: Sherren Kerns RVS  Examination Guidelines: A complete evaluation includes B-mode imaging, spectral Doppler, color Doppler, [name] Bilateral testing is considered an integral part of a complete examination. Limited examinations for reoccurring indications may be performed as noted.  Right Carotid Findings: +----------+--------+--------+--------+------------------+------------------+           PSV cm/sEDV cm/sStenosisPlaque DescriptionComments           +----------+--------+--------+--------+------------------+------------------+ CCA Prox  42      7                                 intimal thickening +----------+--------+--------+--------+------------------+------------------+ CCA Distal47      12                                intimal thickening +----------+--------+--------+--------+------------------+------------------+ ICA Prox  59      19              calcific                             +----------+--------+--------+--------+------------------+------------------+ ICA Mid   60      12                                                   +----------+--------+--------+--------+------------------+------------------+ ICA Distal51  HGB 10.0*   < > 10.4* 12.1 10.1* 8.4* 9.1* 7.5*  HCT 31.4*   < > 32.2* 37.0 31.7* 26.0* 28.7* 23.8*  MCV 96.0   < > 97.0 95.1 95.8 95.9  --  96.4  PLT 178   < > 180 185 154 155  --  164   < > = values in this interval not displayed.   Cardiac Enzymes: No results for input(s): "CKTOTAL", "CKMB", "CKMBINDEX", "TROPONINI" in the last 168 hours. CBG: Recent Labs  Lab 03/16/23 1313 03/16/23 1626 03/16/23 2225 03/16/23 2251 03/17/23 0735  GLUCAP 83 77 68* 146* 61*    Studies/Results: VAS US CAROTID  Result Date: 03/16/2023 Carotid Arterial Duplex Study Patient Name:  Robin [name]  Date of Exam:   03/15/2023 Medical Rec #: [id number]     Accession #:    [id number] Date of Birth: July 25, 1950     Patient Gender: F Patient Age:   76 years Exam Location:  [facility] Procedure:      VAS US CAROTID Referring Phys: Willia Craze  --------------------------------------------------------------------------------  Indications:       CVA and Fall, hip fracture, s/p IM repair. Risk Factors:      Hypertension, hyperlipidemia, Diabetes, coronary artery                    disease. Other Factors:     ESRD, on dialysis. Limitations        Today's exam was limited due to the patient's inability or                    unwillingness to cooperate and the body habitus of the                    patient. Comparison Study:  No prior study Performing Technologist: Sherren Kerns RVS  Examination Guidelines: A complete evaluation includes B-mode imaging, spectral Doppler, color Doppler, [name] Bilateral testing is considered an integral part of a complete examination. Limited examinations for reoccurring indications may be performed as noted.  Right Carotid Findings: +----------+--------+--------+--------+------------------+------------------+           PSV cm/sEDV cm/sStenosisPlaque DescriptionComments           +----------+--------+--------+--------+------------------+------------------+ CCA Prox  42      7                                 intimal thickening +----------+--------+--------+--------+------------------+------------------+ CCA Distal47      12                                intimal thickening +----------+--------+--------+--------+------------------+------------------+ ICA Prox  59      19              calcific                             +----------+--------+--------+--------+------------------+------------------+ ICA Mid   60      12                                                   +----------+--------+--------+--------+------------------+------------------+ ICA Distal51  HGB 10.0*   < > 10.4* 12.1 10.1* 8.4* 9.1* 7.5*  HCT 31.4*   < > 32.2* 37.0 31.7* 26.0* 28.7* 23.8*  MCV 96.0   < > 97.0 95.1 95.8 95.9  --  96.4  PLT 178   < > 180 185 154 155  --  164   < > = values in this interval not displayed.   Cardiac Enzymes: No results for input(s): "CKTOTAL", "CKMB", "CKMBINDEX", "TROPONINI" in the last 168 hours. CBG: Recent Labs  Lab 03/16/23 1313 03/16/23 1626 03/16/23 2225 03/16/23 2251 03/17/23 0735  GLUCAP 83 77 68* 146* 61*    Studies/Results: VAS US CAROTID  Result Date: 03/16/2023 Carotid Arterial Duplex Study Patient Name:  Robin [name]  Date of Exam:   03/15/2023 Medical Rec #: [id number]     Accession #:    [id number] Date of Birth: July 25, 1950     Patient Gender: F Patient Age:   76 years Exam Location:  [facility] Procedure:      VAS US CAROTID Referring Phys: Willia Craze  --------------------------------------------------------------------------------  Indications:       CVA and Fall, hip fracture, s/p IM repair. Risk Factors:      Hypertension, hyperlipidemia, Diabetes, coronary artery                    disease. Other Factors:     ESRD, on dialysis. Limitations        Today's exam was limited due to the patient's inability or                    unwillingness to cooperate and the body habitus of the                    patient. Comparison Study:  No prior study Performing Technologist: Sherren Kerns RVS  Examination Guidelines: A complete evaluation includes B-mode imaging, spectral Doppler, color Doppler, [name] Bilateral testing is considered an integral part of a complete examination. Limited examinations for reoccurring indications may be performed as noted.  Right Carotid Findings: +----------+--------+--------+--------+------------------+------------------+           PSV cm/sEDV cm/sStenosisPlaque DescriptionComments           +----------+--------+--------+--------+------------------+------------------+ CCA Prox  42      7                                 intimal thickening +----------+--------+--------+--------+------------------+------------------+ CCA Distal47      12                                intimal thickening +----------+--------+--------+--------+------------------+------------------+ ICA Prox  59      19              calcific                             +----------+--------+--------+--------+------------------+------------------+ ICA Mid   60      12                                                   +----------+--------+--------+--------+------------------+------------------+ ICA Distal51  HGB 10.0*   < > 10.4* 12.1 10.1* 8.4* 9.1* 7.5*  HCT 31.4*   < > 32.2* 37.0 31.7* 26.0* 28.7* 23.8*  MCV 96.0   < > 97.0 95.1 95.8 95.9  --  96.4  PLT 178   < > 180 185 154 155  --  164   < > = values in this interval not displayed.   Cardiac Enzymes: No results for input(s): "CKTOTAL", "CKMB", "CKMBINDEX", "TROPONINI" in the last 168 hours. CBG: Recent Labs  Lab 03/16/23 1313 03/16/23 1626 03/16/23 2225 03/16/23 2251 03/17/23 0735  GLUCAP 83 77 68* 146* 61*    Studies/Results: VAS US CAROTID  Result Date: 03/16/2023 Carotid Arterial Duplex Study Patient Name:  Robin [name]  Date of Exam:   03/15/2023 Medical Rec #: [id number]     Accession #:    [id number] Date of Birth: July 25, 1950     Patient Gender: F Patient Age:   76 years Exam Location:  [facility] Procedure:      VAS US CAROTID Referring Phys: Willia Craze  --------------------------------------------------------------------------------  Indications:       CVA and Fall, hip fracture, s/p IM repair. Risk Factors:      Hypertension, hyperlipidemia, Diabetes, coronary artery                    disease. Other Factors:     ESRD, on dialysis. Limitations        Today's exam was limited due to the patient's inability or                    unwillingness to cooperate and the body habitus of the                    patient. Comparison Study:  No prior study Performing Technologist: Sherren Kerns RVS  Examination Guidelines: A complete evaluation includes B-mode imaging, spectral Doppler, color Doppler, [name] Bilateral testing is considered an integral part of a complete examination. Limited examinations for reoccurring indications may be performed as noted.  Right Carotid Findings: +----------+--------+--------+--------+------------------+------------------+           PSV cm/sEDV cm/sStenosisPlaque DescriptionComments           +----------+--------+--------+--------+------------------+------------------+ CCA Prox  42      7                                 intimal thickening +----------+--------+--------+--------+------------------+------------------+ CCA Distal47      12                                intimal thickening +----------+--------+--------+--------+------------------+------------------+ ICA Prox  59      19              calcific                             +----------+--------+--------+--------+------------------+------------------+ ICA Mid   60      12                                                   +----------+--------+--------+--------+------------------+------------------+ ICA Distal51  HGB 10.0*   < > 10.4* 12.1 10.1* 8.4* 9.1* 7.5*  HCT 31.4*   < > 32.2* 37.0 31.7* 26.0* 28.7* 23.8*  MCV 96.0   < > 97.0 95.1 95.8 95.9  --  96.4  PLT 178   < > 180 185 154 155  --  164   < > = values in this interval not displayed.   Cardiac Enzymes: No results for input(s): "CKTOTAL", "CKMB", "CKMBINDEX", "TROPONINI" in the last 168 hours. CBG: Recent Labs  Lab 03/16/23 1313 03/16/23 1626 03/16/23 2225 03/16/23 2251 03/17/23 0735  GLUCAP 83 77 68* 146* 61*    Studies/Results: VAS US CAROTID  Result Date: 03/16/2023 Carotid Arterial Duplex Study Patient Name:  Robin [name]  Date of Exam:   03/15/2023 Medical Rec #: [id number]     Accession #:    [id number] Date of Birth: July 25, 1950     Patient Gender: F Patient Age:   76 years Exam Location:  [facility] Procedure:      VAS US CAROTID Referring Phys: Willia Craze  --------------------------------------------------------------------------------  Indications:       CVA and Fall, hip fracture, s/p IM repair. Risk Factors:      Hypertension, hyperlipidemia, Diabetes, coronary artery                    disease. Other Factors:     ESRD, on dialysis. Limitations        Today's exam was limited due to the patient's inability or                    unwillingness to cooperate and the body habitus of the                    patient. Comparison Study:  No prior study Performing Technologist: Sherren Kerns RVS  Examination Guidelines: A complete evaluation includes B-mode imaging, spectral Doppler, color Doppler, [name] Bilateral testing is considered an integral part of a complete examination. Limited examinations for reoccurring indications may be performed as noted.  Right Carotid Findings: +----------+--------+--------+--------+------------------+------------------+           PSV cm/sEDV cm/sStenosisPlaque DescriptionComments           +----------+--------+--------+--------+------------------+------------------+ CCA Prox  42      7                                 intimal thickening +----------+--------+--------+--------+------------------+------------------+ CCA Distal47      12                                intimal thickening +----------+--------+--------+--------+------------------+------------------+ ICA Prox  59      19              calcific                             +----------+--------+--------+--------+------------------+------------------+ ICA Mid   60      12                                                   +----------+--------+--------+--------+------------------+------------------+ ICA Distal51

## 2023-03-17 NOTE — Progress Notes (Signed)
Initial Nutrition Assessment  DOCUMENTATION CODES:   Severe malnutrition in context of acute illness/injury  INTERVENTION:  Initiate tube feeding via Cotrak: Osmolite 1.5 at 20 ml/h and advance by 10 ml every 8 hours until goal rate of 45 ml/hr is reached. (1080 ml per day) Prosource TF20 60 ml daily   Provides 1700 kcal, 87 gm protein, 822 ml free water daily  2. Monitor magnesium and phosphorus every 12 hours x 4 occurrences, MD to replete as needed, as pt is at risk for refeeding syndrome given severe muscle and fat loss.  NUTRITION DIAGNOSIS:   Severe Malnutrition related to chronic illness as evidenced by severe muscle depletion, severe fat depletion.  GOAL:   Patient will meet greater than or equal to 90% of their needs  MONITOR:   TF tolerance, Labs  REASON FOR ASSESSMENT:   New TF    ASSESSMENT:    PMH of ESRD on HD MWF, CAD, Congestive heart failure, Stent palced in March 2024, bipolar, T2DM, Charcot foot, HTN, GERD. Admitted 03/12/2023 due to a fall possibly which resulted to a left intertrochanteric hip fracture. After admission found to have right cerebellar infarct.   Spoke to daughter and son at bedside. Pt stays with daughter and her son at home. Family states that she will eat breakfast consisting of sausage, bacon, grits, oatmeal, or toast and then have small snacks throughout the day. She eats bites of food at dinner and drinks lower sugar drinks.Takes a renal MVI at home. Family states she does not wear her dentures and has poor appetite. They try to make soft foods for her due to trouble chewing because of her dentures. Family says her dentures fit but she does not want to wear them. Family says they have noticed pt losing weight, from 180 to 120 lbs in 1 yr.   Plan for Cortrak on 03/18/23 due to pt being lethargic. Neurology following for status. epilepticus after surgery. EEG continues to show evidence of epilepticus.  Palliative care consulted for goals of  care, pt DNR/DNI plan to treat the relatable but no PEG.   10/17: Admitted  10/19: S/P left repair hip fracture  Admit weight: 120.59 lbs  Current weight: 118.10 lbs  Dry weight: 117.43 lbs  Last Weight  Most recent update: 03/16/2023  8:48 AM    Weight  53.2 kg (117 lb 4.6 oz)             Average Meal Intake: 10/17-10/22: 0% intake x 0 recorded meals  Nutritionally Relevant Medications: Scheduled Meds:  atorvastatin  40 mg Oral Daily   Chlorhexidine Gluconate Cloth  6 each Topical Q0600   clopidogrel  75 mg Oral Daily   doxercalciferol  5 mcg Intravenous Q M,W,F-HD   metoprolol tartrate  2.5 mg Intravenous Q6H   multivitamin  1 tablet Oral QHS   pantoprazole  40 mg Oral QHS   PHENObarbital  65 mg Intravenous QHS   sevelamer carbonate  1,600 mg Oral TID WC   Continuous Infusions:  dextrose 50 mL/hr at 03/17/23 1035   heparin 700 Units/hr (03/16/23 2350)   lacosamide (VIMPAT) IV 150 mg (03/17/23 0528)   levETIRAcetam 1,000 mg (03/17/23 1322)   And   levETIRAcetam Stopped (03/16/23 1739)   tranexamic acid     valproate sodium 500 mg (03/17/23 1429)   Labs Reviewed: BUN 30 mg/dL Creatinine 4.78 mg/dL Phosphorus 6.5 mg/dL CBG ranges from 29-56 mg/dL over the last 24 hours HgbA1c 7.7  NUTRITION - FOCUSED PHYSICAL EXAM:  Flowsheet Row Most Recent Value  Orbital Region Unable to assess  [Fluid]  Upper Arm Region Moderate depletion  Thoracic and Lumbar Region Severe depletion  Buccal Region Unable to assess  [Fluid]  Temple Region Severe depletion  Clavicle Bone Region Unable to assess  [Fluid]  Clavicle and Acromion Bone Region Unable to assess  [Fluid]  Scapular Bone Region Unable to assess  Dorsal Hand Severe depletion  Patellar Region Severe depletion  Anterior Thigh Region Severe depletion  Posterior Calf Region Severe depletion  Edema (RD Assessment) Mild  Hair Reviewed  Eyes Unable to assess  Mouth Reviewed  Skin Reviewed  Nails Reviewed        Diet Order:   Diet Order             Diet NPO time specified  Diet effective now                   EDUCATION NEEDS:   Education needs have been addressed  Skin:  Skin Assessment: Reviewed RN Assessment  Last BM:  10/22  Height:   Ht Readings from Last 1 Encounters:  03/14/23 5' 4.02" (1.626 m)    Weight:   Wt Readings from Last 1 Encounters:  03/16/23 53.2 kg    Ideal Body Weight:  54.05 kg  DRY Body Weight: 54.2 kg  BMI:  Body mass index is 20.12 kg/m.  Estimated Nutritional Needs: Based on dry weight   Kcal:  30-35 kcal/kg. 1590-1,850 kcal  Protein:  1.5-2g/kg. 79-106g  Fluid:  1 ml/kcal  Elliot Dally, RD Registered Dietitian  See Amion for more information

## 2023-03-18 ENCOUNTER — Inpatient Hospital Stay (HOSPITAL_COMMUNITY): Payer: No Typology Code available for payment source

## 2023-03-18 DIAGNOSIS — I480 Paroxysmal atrial fibrillation: Secondary | ICD-10-CM

## 2023-03-18 DIAGNOSIS — S72142A Displaced intertrochanteric fracture of left femur, initial encounter for closed fracture: Secondary | ICD-10-CM | POA: Diagnosis not present

## 2023-03-18 DIAGNOSIS — I63341 Cerebral infarction due to thrombosis of right cerebellar artery: Secondary | ICD-10-CM | POA: Diagnosis not present

## 2023-03-18 DIAGNOSIS — E43 Unspecified severe protein-calorie malnutrition: Secondary | ICD-10-CM | POA: Diagnosis not present

## 2023-03-18 DIAGNOSIS — G40901 Epilepsy, unspecified, not intractable, with status epilepticus: Secondary | ICD-10-CM | POA: Diagnosis not present

## 2023-03-18 DIAGNOSIS — R569 Unspecified convulsions: Secondary | ICD-10-CM | POA: Diagnosis not present

## 2023-03-18 LAB — CBC
HCT: 24.2 % — ABNORMAL LOW (ref 36.0–46.0)
Hemoglobin: 7.8 g/dL — ABNORMAL LOW (ref 12.0–15.0)
MCH: 31 pg (ref 26.0–34.0)
MCHC: 32.2 g/dL (ref 30.0–36.0)
MCV: 96 fL (ref 80.0–100.0)
Platelets: 158 10*3/uL (ref 150–400)
RBC: 2.52 MIL/uL — ABNORMAL LOW (ref 3.87–5.11)
RDW: 17.2 % — ABNORMAL HIGH (ref 11.5–15.5)
WBC: 5 10*3/uL (ref 4.0–10.5)
nRBC: 0 % (ref 0.0–0.2)

## 2023-03-18 LAB — COMPREHENSIVE METABOLIC PANEL
ALT: <5 U/L (ref 0–44)
AST: 18 U/L (ref 15–41)
Albumin: 1.8 g/dL — ABNORMAL LOW (ref 3.5–5.0)
Alkaline Phosphatase: 64 U/L (ref 38–126)
Anion gap: 15 (ref 5–15)
BUN: 36 mg/dL — ABNORMAL HIGH (ref 8–23)
CO2: 19 mmol/L — ABNORMAL LOW (ref 22–32)
Calcium: 8.1 mg/dL — ABNORMAL LOW (ref 8.9–10.3)
Chloride: 103 mmol/L (ref 98–111)
Creatinine, Ser: 6.35 mg/dL — ABNORMAL HIGH (ref 0.44–1.00)
GFR, Estimated: 7 mL/min — ABNORMAL LOW (ref >60)
Glucose, Bld: 127 mg/dL — ABNORMAL HIGH (ref 70–99)
Potassium: 3.1 mmol/L — ABNORMAL LOW (ref 3.5–5.1)
Sodium: 137 mmol/L (ref 135–145)
Total Bilirubin: 0.4 mg/dL (ref 0.3–1.2)
Total Protein: 4.7 g/dL — ABNORMAL LOW (ref 6.5–8.1)

## 2023-03-18 LAB — GLUCOSE, CAPILLARY
Glucose-Capillary: 114 mg/dL — ABNORMAL HIGH (ref 70–99)
Glucose-Capillary: 123 mg/dL — ABNORMAL HIGH (ref 70–99)
Glucose-Capillary: 140 mg/dL — ABNORMAL HIGH (ref 70–99)
Glucose-Capillary: 149 mg/dL — ABNORMAL HIGH (ref 70–99)
Glucose-Capillary: 182 mg/dL — ABNORMAL HIGH (ref 70–99)
Glucose-Capillary: 71 mg/dL (ref 70–99)
Glucose-Capillary: 98 mg/dL (ref 70–99)

## 2023-03-18 LAB — HEPARIN LEVEL (UNFRACTIONATED)
Heparin Unfractionated: 0.22 [IU]/mL — ABNORMAL LOW (ref 0.30–0.70)
Heparin Unfractionated: 0.41 [IU]/mL (ref 0.30–0.70)

## 2023-03-18 LAB — VALPROIC ACID LEVEL: Valproic Acid Lvl: 65 ug/mL (ref 50.0–100.0)

## 2023-03-18 LAB — MAGNESIUM: Magnesium: 1.7 mg/dL (ref 1.7–2.4)

## 2023-03-18 LAB — PHOSPHORUS: Phosphorus: 6 mg/dL — ABNORMAL HIGH (ref 2.5–4.6)

## 2023-03-18 MED ORDER — CLOPIDOGREL BISULFATE 75 MG PO TABS
75.0000 mg | ORAL_TABLET | Freq: Every day | ORAL | Status: DC
  Administered 2023-03-18 – 2023-03-20 (×3): 75 mg
  Filled 2023-03-18 (×3): qty 1

## 2023-03-18 MED ORDER — OSMOLITE 1.5 CAL PO LIQD
1000.0000 mL | ORAL | Status: DC
  Administered 2023-03-18 – 2023-03-20 (×2): 1000 mL
  Filled 2023-03-18 (×4): qty 1000

## 2023-03-18 MED ORDER — MELATONIN 5 MG PO TABS: 5.0000 mg | ORAL_TABLET | Freq: Every evening | ORAL | Status: DC | PRN

## 2023-03-18 MED ORDER — ACETAMINOPHEN 325 MG PO TABS
650.0000 mg | ORAL_TABLET | Freq: Four times a day (QID) | ORAL | Status: DC | PRN
  Administered 2023-03-21: 650 mg
  Filled 2023-03-18: qty 2

## 2023-03-18 MED ORDER — PROSOURCE TF20 ENFIT COMPATIBL EN LIQD
60.0000 mL | Freq: Every day | ENTERAL | Status: DC
Start: 2023-03-18 — End: 2023-03-21
  Administered 2023-03-18 – 2023-03-20 (×3): 60 mL
  Filled 2023-03-18 (×3): qty 60

## 2023-03-18 MED ORDER — ATORVASTATIN CALCIUM 40 MG PO TABS
40.0000 mg | ORAL_TABLET | Freq: Every day | ORAL | Status: DC
  Administered 2023-03-18 – 2023-03-20 (×3): 40 mg
  Filled 2023-03-18 (×3): qty 1

## 2023-03-18 MED ORDER — SEVELAMER CARBONATE 0.8 G PO PACK
1.6000 g | PACK | Freq: Three times a day (TID) | ORAL | Status: DC
  Administered 2023-03-19 – 2023-03-20 (×5): 1.6 g
  Filled 2023-03-18 (×12): qty 2

## 2023-03-18 MED ORDER — PANTOPRAZOLE SODIUM 40 MG IV SOLR
40.0000 mg | INTRAVENOUS | Status: DC
  Administered 2023-03-18 – 2023-03-20 (×3): 40 mg via INTRAVENOUS
  Filled 2023-03-18 (×3): qty 10

## 2023-03-18 MED ORDER — STROKE: EARLY STAGES OF RECOVERY BOOK
Freq: Once | Status: AC
  Filled 2023-03-18: qty 1

## 2023-03-18 MED ORDER — OXYCODONE HCL 5 MG PO TABS
2.5000 mg | ORAL_TABLET | Freq: Four times a day (QID) | ORAL | Status: DC | PRN
  Administered 2023-03-20: 2.5 mg
  Filled 2023-03-18 (×2): qty 1

## 2023-03-18 MED ORDER — PHENOBARBITAL SODIUM 65 MG/ML IJ SOLN
32.5000 mg | Freq: Every day | INTRAMUSCULAR | Status: AC
  Administered 2023-03-18 – 2023-03-19 (×2): 32.5 mg via INTRAVENOUS
  Filled 2023-03-18 (×2): qty 1

## 2023-03-18 MED ORDER — METOPROLOL TARTRATE 12.5 MG HALF TABLET
12.5000 mg | ORAL_TABLET | Freq: Two times a day (BID) | ORAL | Status: DC
  Administered 2023-03-18 – 2023-03-20 (×5): 12.5 mg
  Filled 2023-03-18 (×4): qty 1

## 2023-03-18 MED ORDER — SEVELAMER CARBONATE 800 MG PO TABS: 1600.0000 mg | ORAL_TABLET | Freq: Three times a day (TID) | ORAL | Status: DC

## 2023-03-18 MED ORDER — RENA-VITE PO TABS
1.0000 | ORAL_TABLET | Freq: Every day | ORAL | Status: DC
  Administered 2023-03-18 – 2023-03-20 (×3): 1
  Filled 2023-03-18 (×3): qty 1

## 2023-03-18 MED ORDER — THIAMINE MONONITRATE 100 MG PO TABS
100.0000 mg | ORAL_TABLET | Freq: Every day | ORAL | Status: DC
  Administered 2023-03-18 – 2023-03-20 (×3): 100 mg
  Filled 2023-03-18 (×3): qty 1

## 2023-03-18 MED ORDER — FREE WATER
100.0000 mL | Freq: Three times a day (TID) | Status: DC
  Administered 2023-03-18 – 2023-03-21 (×9): 100 mL

## 2023-03-18 MED ORDER — ORAL CARE MOUTH RINSE: 15.0000 mL | OROMUCOSAL | Status: DC | PRN

## 2023-03-18 MED ORDER — ORAL CARE MOUTH RINSE
15.0000 mL | OROMUCOSAL | Status: DC
  Administered 2023-03-18 – 2023-03-20 (×8): 15 mL via OROMUCOSAL

## 2023-03-18 MED ORDER — POLYETHYLENE GLYCOL 3350 17 G PO PACK: 17.0000 g | PACK | Freq: Every day | ORAL | Status: DC | PRN

## 2023-03-18 MED ORDER — PHENOBARBITAL SODIUM 65 MG/ML IJ SOLN: 32.5000 mg | Freq: Every day | INTRAMUSCULAR | Status: DC

## 2023-03-18 MED ORDER — SODIUM CHLORIDE 0.9 % IV SOLN
50.0000 mg | Freq: Two times a day (BID) | INTRAVENOUS | Status: DC
  Administered 2023-03-18 – 2023-03-19 (×2): 50 mg via INTRAVENOUS
  Filled 2023-03-18 (×3): qty 5

## 2023-03-18 NOTE — Progress Notes (Signed)
Spoke with Dr. Marylu Lund, ok to use cortrak for meds and feedings. Will consult dietician for tube feed orders. Also spoke with pharmacy and they cleared it ok to run Heparin and D10 together.

## 2023-03-18 NOTE — Progress Notes (Signed)
Subjective: Daughter in room, no change patient no interaction currently, for HD today, per daughter feeding tube placement today  Objective Vital signs in last 24 hours: Vitals:   03/18/23 0021 03/18/23 0220 03/18/23 0354 03/18/23 0758  BP: (!) 91/54 (!) 106/58 (!) 117/48 138/63  Pulse: (!) 59 66 (!) 58 60  Resp: 16 15 16 13   Temp:   98.1 F (36.7 C) 98 F (36.7 C)  TempSrc:   Oral Oral  SpO2: 100% 100% 100% 100%  Weight:      Height:       Weight change:   Physical Exam: General: Thin chronically ill-appearing elderly female, no response to voice or tactile stimuli Heart: Irregular irregular  rate stable, no MRG Lungs: CTA anteriorly nonlabored Abdomen: NABS soft NT ND Extremities: Trace bi pedal edema Dialysis Access: [facility]      Dialysis Orders: MWF  - [facility]  3.5hrs, BFR 300, DFR AF 1.5,  EDW 54.2kg, 2K/ 2Ca UFP 1 Access: AVF, TDC  Heparin none Mircera 30 mcg q4wks - last 10/7 Venofer 100mg  qHD x10 Hectorol IV qHD       Assessment/Plan:  L hip Frx - 2/2 Fall at home.  ORIF on 03/14/23.  Stroke - MRI brain demonstrated a R brachium pontine stroke.   neuro following. /("trial Cortrak" )today /dysphagia  Tremor - new started overnight 03/15/23 .  Not evident to me this a.m.  Neuro following   Does not appear to be uremic with renal panel   ESRD -  on HD MWF schedule continue to use BFR 300(with cardiac history)  Hypertension/volume  -  BP soft overnight, 10/20  meds held.  Improving some today on  IV metoprolol 2.5 mg every 6 hours does not appearing volume overloaded, meeting edw at outpatient. UF as tolerated.   Anemia of CKD - Hgb dropped to 7.5 <8.4. Will defer to primary to at least check FOBT. Transfuse PRN for hgb <7. Will check Fe panel  Secondary Hyperparathyroidism -corrected Ca evaded, phos 7.5 renvela powder when diet started, hold  VDRA with^ Ca   Nutrition -ALB 2.2 per daughter asking for for feeding tube placement ("trial Cortrak" )today /dysphagia with  CVA DMT2-per primary Hx CAD  Robin Pastel, PA-C [facility] Beeper [phone] 03/18/2023,8:18 AM  LOS: 6 days   Labs: Basic Metabolic Panel: Recent Labs  Lab 03/14/23 0448 03/15/23 0930 03/16/23 0456 03/17/23 0627  NA 135 133* 136 139  K 3.8 5.1 4.0 3.5  CL 88* 90* 94* 98  CO2 17* 19* 23 24  GLUCOSE 252* 226* 97 64*  BUN 16 36* 44* 30*  CREATININE 4.20* 6.17* 7.38* 5.25*  CALCIUM 10.0 9.7 9.5 8.9  PHOS 7.0* 7.5*  --  6.5*   Liver Function Tests: Recent Labs  Lab 03/15/23 0930 03/16/23 0456 03/17/23 0627  AST  --  19 17  ALT  --  <5 <5  ALKPHOS  --  79 70  BILITOT  --  0.7 0.6  PROT  --  5.9* 5.7*  ALBUMIN 3.0* 2.4* 2.2*   No results for input(s): "LIPASE", "AMYLASE" in the last 168 hours. Recent Labs  Lab 03/16/23 0456  AMMONIA 32   CBC: Recent Labs  Lab 03/12/23 1702 03/12/23 1813 03/14/23 0448 03/15/23 0930 03/16/23 0456 03/16/23 1446 03/17/23 0627 03/17/23 1814 03/18/23 0423  WBC 8.4   < > 12.4* 12.1* 8.4  --  5.4  --  5.0  NEUTROABS 6.8  --   --   --   --   --   --   --   --  HGB 10.0*   < > 12.1 10.1* 8.4*   < > 7.5* 8.9* 7.8*  HCT 31.4*   < > 37.0 31.7* 26.0*   < > 23.8* 28.4* 24.2*  MCV 96.0   < > 95.1 95.8 95.9  --  96.4  --  96.0  PLT 178   < > 185 154 155  --  164  --  158   < > = values in this interval not displayed.   Cardiac Enzymes: No results for input(s): "CKTOTAL", "CKMB", "CKMBINDEX", "TROPONINI" in the last 168 hours. CBG: Recent Labs  Lab 03/17/23 1847 03/17/23 2022 03/18/23 0043 03/18/23 0512 03/18/23 0800  GLUCAP 73 64* 114* 71 98    Studies/Results: Overnight EEG with video  Result Date: 03/16/2023 Robin Quest, MD     03/17/2023  8:52 AM [name] MRN: [id number] Epilepsy Attending: Charlsie [name] Referring Physician/Provider: [name], MD Duration: 03/15/2023 1229 to 03/16/2023 1229  Patient history: 72yo F with seizure like activity getting eeg to evaluate for  seizure  Level of alertness: Awake/ lethargic  AEDs during EEG study: VPA, LCM, LEV  Technical aspects: This EEG study was done with scalp electrodes positioned according to the 10-20 International system of electrode placement. Electrical activity was reviewed with band pass filter of 1-70Hz , sensitivity of 7 uV/mm, display speed of 4mm/sec with a 60Hz  notched filter applied as appropriate. EEG data were recorded continuously and digitally stored.  Video monitoring was available and reviewed as appropriate.  Description: EEG showed continuous generalized  3 to 6 Hz theta-delta slowing. Generalized polyspikes and fast activity lasting 1-3 seconds was noted every 1-2 seconds.  Clinically patient was noted to have tremor-like activity in bilateral upper extremity at times but it was difficult to see if this was time locked with the polyspikes.  Patient was loaded with valproic acid and subsequently with lacosamide without significant improvement in EEG.  Eventually patient was loaded with levetiracetam on 03/15/2023 at around midnight.  However, EEG continued to show continuous generalized 3 to 5 Hz theta-delta slowing admixed with generalized spike and wave as well as polyspikes which appeared near continuous. Hyperventilation and photic stimulation were not performed.    ABNORMALITY - Status epilepticus, generalized - Continuous slow, generalized  IMPRESSION: This study showed evidence of status epilepticus with generalized onset.  Additionally there is moderate to severe diffuse encephalopathy.  Robin [name]  Medications:  dextrose Stopped (03/17/23 1318)   heparin 800 Units/hr (03/18/23 0523)   lacosamide (VIMPAT) IV 150 mg (03/18/23 0521)   levETIRAcetam Stopped (03/17/23 1338)   And   levETIRAcetam Stopped (03/16/23 1739)   tranexamic acid     valproate sodium 500 mg (03/18/23 0226)    atorvastatin  40 mg Oral Daily   Chlorhexidine Gluconate Cloth  6 each Topical Q0600   clopidogrel  75 mg Oral  Daily   doxercalciferol  5 mcg Intravenous Q M,W,F-HD   metoprolol tartrate  2.5 mg Intravenous Q6H   multivitamin  1 tablet Oral QHS   pantoprazole  40 mg Oral QHS   PHENObarbital  65 mg Intravenous QHS   sevelamer carbonate  1,600 mg Oral TID WC

## 2023-03-18 NOTE — Progress Notes (Signed)
Subjective: Daughter and daughter Weyman Croon at bedside.  States patient appears more relaxed and has not had any more tremor-like movements.  But still not waking up.  Denies any other concerns  ROS: Unable to obtain due to poor mental status  Examination  Vital signs in last 24 hours: Temp:  [97.2 F (36.2 C)-98.1 F (36.7 C)] 98 F (36.7 C) (10/23 0758) Pulse Rate:  [34-80] 60 (10/23 0758) Resp:  [10-18] 13 (10/23 0758) BP: (91-176)/(48-87) 138/63 (10/23 0758) SpO2:  [87 %-100 %] 100 % (10/23 0758)  General: lying in bed, NAD Neuro: Barely opens eyes to repeated noxious stimulation, did not follow commands, pupils appear equally round and reactive, no forced gaze deviation, withdraws to noxious stimuli with antigravity strength in left upper and left lower extremity, 0/5 in right upper extremity, 1/5 in right lower extremity  Basic Metabolic Panel: Recent Labs  Lab 03/13/23 1239 03/13/23 1300 03/14/23 0448 03/15/23 0930 03/16/23 0456 03/17/23 0627  NA  --  140 135 133* 136 139  K  --  4.4 3.8 5.1 4.0 3.5  CL  --  97* 88* 90* 94* 98  CO2  --  25 17* 19* 23 24  GLUCOSE  --  112* 252* 226* 97 64*  BUN  --  23 16 36* 44* 30*  CREATININE  --  6.66* 4.20* 6.17* 7.38* 5.25*  CALCIUM  --  9.5 10.0 9.7 9.5 8.9  MG  --   --  2.0  --   --  1.9  PHOS 6.7* 6.7* 7.0* 7.5*  --  6.5*    CBC: Recent Labs  Lab 03/12/23 1702 03/12/23 1813 03/14/23 0448 03/15/23 0930 03/16/23 0456 03/16/23 1446 03/17/23 0627 03/17/23 1814 03/18/23 0423  WBC 8.4   < > 12.4* 12.1* 8.4  --  5.4  --  5.0  NEUTROABS 6.8  --   --   --   --   --   --   --   --   HGB 10.0*   < > 12.1 10.1* 8.4* 9.1* 7.5* 8.9* 7.8*  HCT 31.4*   < > 37.0 31.7* 26.0* 28.7* 23.8* 28.4* 24.2*  MCV 96.0   < > 95.1 95.8 95.9  --  96.4  --  96.0  PLT 178   < > 185 154 155  --  164  --  158   < > = values in this interval not displayed.     Coagulation Studies: No results for input(s): "LABPROT", "INR" in the last 72  hours.  Imaging No new brain imaging overnight  ASSESSMENT AND PLAN: 72 year old female with history of hypertension, diabetes, hyperlipidemia, coronary artery disease, end-stage renal disease on hemodialysis (Monday Wednesday Friday), A-fib on Eliquis who had a fall at home and subsequently had left hip fracture.  While in the hospital was also found to have right cerebellar infarct and has seen by stroke team.  However yesterday patient was noted to have right facial twitching and upper extremity tremor-like movements.  EEG was obtained which showed generalized spikes and polyspikes.  Therefore patient was started on Depakote.  Long-term EEG continued to show status epilepticus.  Vimpat and subsequently Keppra was added.  However, status epilepticus has not resolved.   Status epilepticus, resolved -Reportedly patient has history of PEA arrest in February 2023.  With the current EEG pattern, it is possible that patient had seizure recurrence due to lowered seizure threshold in the setting of hip fracture and subsequent management   Recommendations -  Will start phenobarbital taper to minimize sedation -Discontinue Depakote -Reduce Vimpat to 50 mg twice daily -Continue Keppra 1000 mg daily with an additional 500 mg dose after dialysis on Monday Wednesday Friday -DC LTM EEG as no further seizures -MRI brain without contrast for stroke workup -Given patient's age, history of cardiac arrest, seizures and other comorbidities as well as poor neurocognitive reserve, it might take patient prolonged time to gradually improve.  In the meantime would recommend continuing delirium precautions and minimizing any other sedating meds -Continue seizure precautions -Discussed plan with daughter at bedside -Updated Dr. Caleb Popp about the plan by secure chat   I have spent a total of 38 minutes with the patient reviewing hospital notes,  test results, labs and examining the patient as well as establishing an  assessment and plan that was discussed personally with the patient.  > 50% of time was spent in direct patient care.        Lindie Spruce Epilepsy [facility] For questions after 5pm please refer to AMION to reach the Neurologist on call

## 2023-03-18 NOTE — Progress Notes (Signed)
vLTM maintenance  All impedances below 10kohms.  No skin breakdown noted at  FP1  FP2  A2  T4

## 2023-03-18 NOTE — Progress Notes (Signed)
PHARMACY - ANTICOAGULATION CONSULT NOTE  Pharmacy Consult for heparin Indication: atrial fibrillation (PTA Eliquis)  No Known Allergies  Patient Measurements: Height: 5' 4.02" (162.6 cm) Weight: 53.2 kg (117 lb 4.6 oz) IBW/kg (Calculated) : 54.74 Heparin Dosing Weight: 52.9 kg  Vital Signs: Temp: 98.1 F (36.7 C) (10/23 0354) Temp Source: Oral (10/23 0354) BP: 117/48 (10/23 0354) Pulse Rate: 58 (10/23 0354)  Labs: Recent Labs    03/15/23 0930 03/16/23 0456 03/16/23 1446 03/17/23 0627 03/17/23 1814 03/18/23 0423  HGB 10.1* 8.4*   < > 7.5* 8.9* 7.8*  HCT 31.7* 26.0*   < > 23.8* 28.4* 24.2*  PLT 154 155  --  164  --  158  HEPARINUNFRC  --   --   --  0.31  --  0.22*  CREATININE 6.17* 7.38*  --  5.25*  --   --    < > = values in this interval not displayed.    Estimated Creatinine Clearance: 8.4 mL/min (A) (by C-G formula based on SCr of 5.25 mg/dL (H)).   Medical History: Past Medical History:  Diagnosis Date   Acute delirium 11/18/2014   Acute encephalopathy    Acute on chronic respiratory failure with hypoxia (HCC) 09/09/2016   Acute respiratory failure with hypoxia (HCC) 11/29/2015   Anemia in chronic kidney disease 12/09/2014   Anxiety    Arthritis    Benign hypertension    Bipolar affective disorder (HCC) 12/09/2014   CAD (coronary artery disease), native coronary artery with 2 stents  11/17/2014   Charcot foot due to diabetes mellitus (HCC)    Chronic combined systolic and diastolic CHF (congestive heart failure) (HCC) 11/18/2014   Closed left ankle fracture 11/17/2014   Confusion 01/21/2015   Depression    Diabetes mellitus without complication (HCC)    End stage renal disease on dialysis (HCC)    Fracture dislocation of ankle 11/17/2014   GERD (gastroesophageal reflux disease)    Heart murmur    History of blood transfusion    History of bronchitis    History of pneumonia    Hyperlipidemia 03/26/2015   Hypertension associated with diabetes (HCC) 11/18/2014    Hypertensive heart/renal disease with failure (HCC) 12/09/2014   Hypokalemia 11/17/2014   Multiple falls 01/21/2015   Nausea & vomiting 01/15/2023   Obesity 11/17/2014   Onychomycosis 10/07/2016   Right leg DVT (HCC) 12/05/2015   SIRS (systemic inflammatory response syndrome) (HCC) 08/08/2016   Sleep apnea    Unstable angina (HCC) 12/26/2017   Ventricular tachycardia [facility])     Assessment: 72 yo female with PMH of DVT in 2017 and Afib on Eliquis (last dose PTA, 10/17 AM) who presented 10/17 with stroke confirmed on CT head / MRI and now s/p L hip replacement 10/19.  Head CT 10/20 with no acute hemorrhage.  Pharmacy consulted for heparin dosing until able to tolerate PO.  Heparin level subtherapeutic this morning at 0.22, CBC stable.  Goal of Therapy:  Heparin level 0.3-0.5 units/ml, no bolus Monitor platelets by anticoagulation protocol: Yes   Plan:  Increase heparin to 800 units/h Daily heparin level and CBC  Fredonia Highland, PharmD, BCPS, [facility] Clinical Pharmacist Please check AMION for all [facility] Pharmacy numbers 03/18/2023

## 2023-03-18 NOTE — Plan of Care (Signed)

## 2023-03-18 NOTE — Progress Notes (Signed)
PROGRESS NOTE    Robin [name]  VHQ:[id number] DOB: 1950/05/30 DOA: 03/13/2023 PCP: [facility]   Brief Narrative: Robin [name] is a 72 y.o. female with a history of ESRD on hemodialysis, CAD status post PCI with stent placement, heart failure with reduced EF, diabetes mellitus type 2, hypertension.  Patient presented secondary to fall with subsequent left hip pain and was found to have evidence of a left hip fracture.  Patient underwent left intertrochanteric IM rodding on 09-Apr-2023, however on 10/20, patient was found to have intermittent facial tremors with bilateral upper extremity tremors with confirmation of status epilepticus on EEG.  Neurology was consulted.  Status epilepticus resolved after phenobarbital administration.  During workup, patient was also found to have evidence of an acute versus early subacute infarct involving the right brachium pontis.  NG tube placed on 10/23 for nutrition.   Assessment and Plan:  Status epilepticus New seizure diagnosis. Episode occurred after surgery. Associated CVA. Neurology consulted. Patient noted to have evidence of status epilepticus on EEG. Status epilepticus resolved after phenobarbital. -Neurology recommendations: stop EEG. MRI brain, phenobarbital taper, discontinue Depakote, decrease to Vimpat 50 mg BID, continue Keppra  Acute vs early subacute infarct MRI brain (10/17) confirms acute vs early subacute infarct involving the right brachium pontis. MRA head without proximal large vessel occlusion or high-grade stenosis. LDL of 105. Hemoglobin A1C of 7.7%. Transthoracic Echocardiogram not obtained this admission. Neurology recommendations for continued anticoagulation with Eliquis. Recommendation for Neurology follow-up in 4 weeks. -Will need to obtain a Transthoracic Echocardiogram this admission per neurology recommendations  Dysphagia Secondary to acute stroke seizures with resultant encephalopathy.  NG Cortrak placed on  10/23.  Discussed long-term nutrition goals with daughter at bedside.  Daughter would be okay with PEG tube for feeding if patient's quality of life would benefit however she would decline PEG if good quality of life was not achievable -Continue Cortrak and tube feeds  Paroxysmal atrial fibrillation with RVR -Continue metoprolol and heparin IV  Closed left hip fracture Secondary to fall.  Orthopedic surgery was consulted on admission and performed left intertrochanteric fracture IM rodding on April 09, 2023.  Therapy held secondary to encephalopathy and seizures.  Acute anemia Unclear etiology. Complicated by anticoagulation. Anemia panel suggests chronic illness picture. No obvious evidence of hemorrhaging/GI bleeding. -FOBT -Continue Heparin IV  Diabetes mellitus type 2 Well-controlled with hemoglobin A1c of 7.7%.  Patient has been having hypoglycemia this admission secondary to poor oral intake in addition to insulin administration.  Insulin held.  D10 fluids initially started to help with ischemia.  Patient is now on tube feeds via NG Cortrak tube.  CAD -Continue heparin -Plan to resume Plavix  History of PE -Continue Heparin IV with eventual transition to DOAC  ESRD on HD Nephrology consulted.  Patient is currently on a Monday, Wednesday, Friday schedule for hemodialysis.  Bipolar disorder Patient is on Abilify as an outpatient which was held secondary to inability to take p.o.  Primary hypertension -Continue metoprolol   Chronic heart failure with reduced EF Last echocardiogram with an LVEF of 45 to 50% with associated grade 2 diastolic dysfunction.  Patient is euvolemic.  Severe protein calorie malnutrition As diagnosed by dietitian.  Patient is currently on tube feeds.  Cortrak secondary to dysphagia.  Goals of care Palliative care has been consulted and is following.    DVT prophylaxis: Heparin drip Code Status:   Code Status: Limited: Do not attempt resuscitation (DNR)  -DNR-LIMITED -Do Not Intubate/DNI  Family  Communication: Daughter at bedside Disposition Plan: Discharge is pending improvement of encephalopathy   Consultants:  Orthopedic surgery Nephrology Cardiology Palliative care medicine  Procedures:  10/20 > 10/23 LTM EEG  Antimicrobials: None    Subjective: Patient unable to provide history secondary to mental status.  Per daughter, patient has shown signs of improvement in mental status.  Patient is more interactive with the world around her and seems to respond to certain individuals at times.  Objective: BP 138/63 (BP Location: Right Arm)   Pulse 60   Temp 98 F (36.7 C) (Oral)   Resp 13   Ht 5' 4.02" (1.626 m)   Wt 53.2 kg   SpO2 100%   BMI 20.12 kg/m   Examination:  General exam: Appears calm and comfortable  Respiratory system: Clear to auscultation. Respiratory effort normal. Cardiovascular system: S1 & S2 heard, irregular rhythm with normal rate. Gastrointestinal system: Abdomen is nondistended, soft and nontender. No organomegaly or masses felt. Normal bowel sounds heard. Central nervous system: Somnolent. Does not arouse to voice or painful stimuli, but does grunt/grimace to painful stimuli.  Musculoskeletal: No edema. No calf tenderness   Data Reviewed: I have personally reviewed following labs and imaging studies  CBC Lab Results  Component Value Date   WBC 5.0 03/18/2023   RBC 2.52 (L) 03/18/2023   HGB 7.8 (L) 03/18/2023   HCT 24.2 (L) 03/18/2023   MCV 96.0 03/18/2023   MCH 31.0 03/18/2023   PLT 158 03/18/2023   MCHC 32.2 03/18/2023   RDW 17.2 (H) 03/18/2023   LYMPHSABS 0.8 03/20/2023   MONOABS 0.7 02/28/2023   EOSABS 0.0 03/10/2023   BASOSABS 0.0 03/26/2023     Last metabolic panel Lab Results  Component Value Date   NA 139 03/17/2023   K 3.5 03/17/2023   CL 98 03/17/2023   CO2 24 03/17/2023   BUN 30 (H) 03/17/2023   CREATININE 5.25 (H) 03/17/2023   GLUCOSE 64 (L) 03/17/2023   GFRNONAA 8  (L) 03/17/2023   GFRAA 5 (L) 01/31/2020   CALCIUM 8.9 03/17/2023   PHOS 6.5 (H) 03/17/2023   PROT 5.7 (L) 03/17/2023   ALBUMIN 2.2 (L) 03/17/2023   BILITOT 0.6 03/17/2023   ALKPHOS 70 03/17/2023   AST 17 03/17/2023   ALT <5 03/17/2023   ANIONGAP 17 (H) 03/17/2023    GFR: Estimated Creatinine Clearance: 8.4 mL/min (A) (by C-G formula based on SCr of 5.25 mg/dL (H)).  No results found for this or any previous visit (from the past 240 hour(s)).    Radiology Studies: No results found.    LOS: 6 days    Jacquelin Hawking, MD [facility] 03/18/2023, 10:49 AM   If 7PM-7AM, please contact night-coverage [email or web address]

## 2023-03-18 NOTE — Progress Notes (Signed)
Patient off floor to MRI

## 2023-03-18 NOTE — Progress Notes (Signed)
SLP Cancellation Note  Patient Details Name: Robin [name] MRN: [id number] DOB: [birth year]   Cancelled treatment:    Pt very lethargic per chart review; cortrak present. Remains NPO. Will follow as appropriate.  Railynn Ballo L. Samson Frederic, MA CCC/SLP Clinical Specialist - Acute Care SLP Acute Rehabilitation Services Office number [phone]        Blenda Mounts Laurice 03/18/2023, 3:53 PM

## 2023-03-18 NOTE — Hospital Course (Addendum)
Robin [name] is a 72 y.o. female with a history of ESRD on hemodialysis, CAD status post PCI with stent placement, heart failure with reduced EF, diabetes mellitus type 2, hypertension.  Patient presented secondary to fall with subsequent left hip pain and was found to have evidence of a left hip fracture.  Patient underwent left intertrochanteric IM rodding on 10/19, however on 10/20, patient was found to have intermittent facial tremors with bilateral upper extremity tremors with confirmation of status epilepticus on EEG.  Neurology was consulted.  Status epilepticus resolved after phenobarbital administration.  During workup, patient was also found to have evidence of an acute versus early subacute infarct involving the right brachium pontis.  NG tube placed on 10/23 for nutrition. AED were weaned with improvement of mental status. Diet advanced and patient started mobility with physical therapy. During hemodialysis session on 10/26, patient was found to be pulseless with agonal breathing; ventricular fibrillation noted on monitoring. Patient received rescue breaths with bag valve mask but CPR not initiated secondary to code status.

## 2023-03-18 NOTE — Progress Notes (Signed)
PT Cancellation Note  Patient Details Name: EYTHEL HENKEN MRN: [id number] DOB: 09/23/1950   Cancelled Treatment:    Reason Eval/Treat Not Completed: Per chart review, pt not able to follow commands today with barely opening eyes to repeated noxious stimulation. Acute PT to follow as appropriate.   Hilton Cork, PT, DPT Secure Chat Preferred  Rehab Office [phone]   Arturo Morton Brion Aliment 03/18/2023, 12:23 PM

## 2023-03-18 NOTE — Progress Notes (Signed)
PHARMACY - ANTICOAGULATION CONSULT NOTE  Pharmacy Consult for heparin Indication: atrial fibrillation (PTA Eliquis)  No Known Allergies  Patient Measurements: Height: 5' 4.02" (162.6 cm) Weight: 53.2 kg (117 lb 4.6 oz) IBW/kg (Calculated) : 54.74 Heparin Dosing Weight: 52.9 kg  Vital Signs: Temp: 97.6 F (36.4 C) (10/23 1155) Temp Source: Axillary (10/23 1155) BP: 102/60 (10/23 1300) Pulse Rate: 79 (10/23 1300)  Labs: Recent Labs    03/16/23 0456 03/16/23 1446 03/17/23 0627 03/17/23 1814 03/18/23 0423 03/18/23 1348  HGB 8.4*   < > 7.5* 8.9* 7.8*  --   HCT 26.0*   < > 23.8* 28.4* 24.2*  --   PLT 155  --  164  --  158  --   HEPARINUNFRC  --   --  0.31  --  0.22* 0.41  CREATININE 7.38*  --  5.25*  --   --   --    < > = values in this interval not displayed.    Estimated Creatinine Clearance: 8.4 mL/min (A) (by C-G formula based on SCr of 5.25 mg/dL (H)).   Medical History: Past Medical History:  Diagnosis Date   Acute delirium 11/18/2014   Acute encephalopathy    Acute on chronic respiratory failure with hypoxia (HCC) 09/09/2016   Acute respiratory failure with hypoxia (HCC) 11/29/2015   Anemia in chronic kidney disease 12/09/2014   Anxiety    Arthritis    Benign hypertension    Bipolar affective disorder (HCC) 12/09/2014   CAD (coronary artery disease), native coronary artery with 2 stents  11/17/2014   Charcot foot due to diabetes mellitus (HCC)    Chronic combined systolic and diastolic CHF (congestive heart failure) (HCC) 11/18/2014   Closed left ankle fracture 11/17/2014   Confusion 01/21/2015   Depression    Diabetes mellitus without complication (HCC)    End stage renal disease on dialysis (HCC)    Fracture dislocation of ankle 11/17/2014   GERD (gastroesophageal reflux disease)    Heart murmur    History of blood transfusion    History of bronchitis    History of pneumonia    Hyperlipidemia 03/26/2015   Hypertension associated with diabetes (HCC) 11/18/2014    Hypertensive heart/renal disease with failure (HCC) 12/09/2014   Hypokalemia 11/17/2014   Multiple falls 01/21/2015   Nausea & vomiting 01/15/2023   Obesity 11/17/2014   Onychomycosis 10/07/2016   Right leg DVT (HCC) 12/05/2015   SIRS (systemic inflammatory response syndrome) (HCC) 08/08/2016   Sleep apnea    Unstable angina (HCC) 12/26/2017   Ventricular tachycardia [name])     Assessment: 72 yo female with PMH of DVT in 2017 and Afib on Eliquis (last dose PTA, 10/17 AM) who presented 10/17 with stroke confirmed on CT head / MRI and now s/p L hip replacement 10/19.  Head CT 10/20 with no acute hemorrhage.  Pharmacy consulted for heparin dosing until able to tolerate PO.  Heparin level 0.41 therapeutic after rate increase to 800 units/hr.    Goal of Therapy:  Heparin level 0.3-0.5 units/ml, no bolus Monitor platelets by anticoagulation protocol: Yes   Plan:  Continue heparin 800 units/h Daily heparin level and CBC  Trixie Rude, PharmD Clinical Pharmacist 03/18/2023  3:22 PM

## 2023-03-18 NOTE — Progress Notes (Signed)
Daily Progress Note   [name]       Date: 03/18/2023 DOB: 07/07/1950  Age: 72 y.o. MRN#: [id number] Attending Physician: Narda Bonds, MD Primary Care Physician: [facility] Admit Date: 03/05/2023  Reason for Consultation/Follow-up: Establishing goals of care  Subjective: Medical records reviewed including progress notes, labs, imaging. Patient assessed at the bedside. She remains unresponsive, though she moved her left side for the first time today per daughter.  Created space and opportunity for family's thoughts and feelings on patient's current illness. She continues watchful waiting and reports having a good conversation with patient's primary attending today.    [name] prioritizes patient's quality of life above quantity of life and will base further decisions heavily on patient's mental status, pending additional adjustments to antiseizure medications and MRI results.  We discussed cortrak placement and careful monitoring for opportunity to restart her psychiatric medication.  Discussed plans for HD later today, possible SNF placement, and importance of ongoing goals of care discussions.  Questions and concerns addressed. PMT will continue to support holistically.   Length of Stay: 6   Physical Exam Vitals and nursing note reviewed.  Constitutional:      General: She is not in acute distress.    Appearance: She is ill-appearing.  Cardiovascular:     Rate and Rhythm: Normal rate.  Pulmonary:     Effort: Pulmonary effort is normal.  Skin:    General: Skin is warm and dry.  Neurological:     Mental Status: She is unresponsive.            Vital Signs: BP 138/63 (BP Location: Right Arm)   Pulse 60   Temp 98 F (36.7 C) (Oral)   Resp 13   Ht  5' 4.02" (1.626 m)   Wt 53.2 kg   SpO2 100%   BMI 20.12 kg/m  SpO2: SpO2: 100 % O2 Device: O2 Device: Nasal Cannula O2 Flow Rate: O2 Flow Rate (L/min): 2 L/min      Palliative Assessment/Data: 30% (on tube feeds)   Palliative Care Assessment & Plan   Patient Profile: 72 y.o. female  with past medical history of ESRD on HD MWF, coronary artery disease status post PCI with stent placement March 2024, HFrEF 45 to 50%, type 2 diabetes, hypertension, bipolar disorder, DVT, admitted on 02/27/2023 with fall at home.    In the ED, workup revealed left intertrochanteric hip fracture seen on x-ray.  She underwent surgical repair and was also found to have an acute to early subacute ischemic stroke on arrival.  Unfortunately, she developed seizures/status epilepticus after surgery, as well as A-fib with RVR and dysphagia. PMT has been consulted to assist with goals of care conversation.  Assessment: Goals of care conversation Acute versus early subacute infarct Dysphagia Left hip fracture status post surgical repair Status epilepticus, resolved  Recommendations/Plan: Continue DNR/DNI Continue current care plan, allow more time for outcomes and treat the treatable Ongoing goals of care discussions pending additional diagnostics and patient's progress or lack thereof Psychosocial and emotional support provided PMT will continue to follow and support   Prognosis: Guarded to poor given several chronic comorbidities and rapid functional/nutritional/cognitive decline  Discharge Planning: Skilled Nursing Facility for rehab with Palliative care service follow-up  Care plan was discussed with patient's daughter   Total time: I spent 35 minutes in the care of the patient today in the above activities and documenting the encounter.          Abiola Behring Jeni Salles, PA-C  Palliative Medicine Team Team phone # [phone]  Thank you for allowing the Palliative Medicine Team to assist in the  care of this patient. Please utilize secure chat with additional questions, if there is no response within 30 minutes please call the above phone number.  Palliative Medicine Team providers are available by phone from 7am to 7pm daily and can be reached through the team cell phone.  Should this patient require assistance outside of these hours, please call the patient's attending physician.

## 2023-03-18 NOTE — Procedures (Addendum)
[name]  MRN: [id number]  Epilepsy Attending: [name]  Referring Physician/Provider: [name], MD  Duration: 03/17/2023 1229 to 03/18/2023 1040   Patient history: 72yo F with seizure like activity getting eeg to evaluate for seizure   Level of alertness: Awake/ lethargic, asleep    AEDs during EEG study: VPA, LCM, LEV, Phenobarb   Technical aspects: This EEG study was done with scalp electrodes positioned according to the 10-20 International system of electrode placement. Electrical activity was reviewed with band pass filter of 1-70Hz , sensitivity of 7 uV/mm, display speed of 31mm/sec with a 60Hz  notched filter applied as appropriate. EEG data were recorded continuously and digitally stored.  Video monitoring was available and reviewed as appropriate.   Description: EEG showed continuous generalized 3 to 5 Hz theta-delta slowing. Intermittently generalized periodic discharges with triphasic morphology were noted at 1 Hz, more prominent when awake/stimulated.  Sleep was characterized by sleep spindles (12 to 14 Hz), maximal frontocentral region.  Robin [name] hyperventilation and photic stimulation were not performed.      ABNORMALITY - Periodic discharges with triphasic morphology, generalized - Continuous slow, generalized   IMPRESSION: This study is suggestive of moderate diffuse encephalopathy, likely secondary to toxic-metabolic causes.  No seizures or definite epileptiform discharges were noted.  EEG is continuing improved compared to previous day.  Robin [name]

## 2023-03-18 NOTE — TOC Progression Note (Signed)
Transition of Care [facility]) - Progression Note    Patient Details  Name: Robin [name] MRN: [id number] Date of Birth: 04-09-1951  Transition of Care [facility] CM/SW Contact  Delilah Shan, LCSWA Phone Number: 03/18/2023, 12:18 PM  Clinical Narrative:     Patient has SN[facility] bed [facility].CSW following to start insurance authorization closer to patient being medically ready for dc.  Expected Discharge Plan: Skilled Nursing Facility Barriers to Discharge: Continued Medical Work up  Expected Discharge Plan and Services In-house Referral: Clinical Social Work     Living arrangements for the past 2 months: Single Family Home                                       Social Determinants of Health (SDOH) Interventions SDOH Screenings   Food Insecurity: No Food Insecurity (01/15/2023)  Housing: Patient Declined (01/15/2023)  Transportation Needs: No Transportation Needs (01/15/2023)  Utilities: Not At Risk (01/15/2023)  Tobacco Use: High Risk (03/14/2023)    Readmission Risk Interventions     No data to display

## 2023-03-18 NOTE — Progress Notes (Signed)
LTM EEG discontinued - no skin breakdown at unhook.  

## 2023-03-18 NOTE — Procedures (Signed)
Cortrak  Person Inserting Tube:  Lisa-Marie Rueger T, RD Tube Type:  Cortrak - 43 inches Tube Size:  10 Tube Location:  Left nare Secured by: Bridle Technique Used to Measure Tube Placement:  Marking at nare/corner of mouth Cortrak Secured At:  64 cm   Cortrak Tube Team Note:  Consult received to place a Cortrak feeding tube.   X-ray is required, abdominal x-ray has been ordered by the Cortrak team. Please confirm tube placement before using the Cortrak tube.   If the tube becomes dislodged please keep the tube and contact the Cortrak team at [email or web address] for replacement.  If after hours and replacement cannot be delayed, place a NG tube and confirm placement with an abdominal x-ray.    Robin [name] RD, LDN For contact information, refer to AMiON.   

## 2023-03-18 NOTE — Progress Notes (Signed)
Orthopedic Note  Patient seen this morning.  She was sleeping in bed with EEG monitors attached in her head.  Dressings were checked.  There is a quarter sized spot of blood on the more proximal dressing.  The distal dressing has no blood on it.  Besides the one spot of blood, dressings are C/D/I.  I let patient and daughter continue to sleep this morning.  Will continue to check in periodically.  If she remains in the hospital for a longer period of time, we will get her routine hip x-rays which would have been obtained in the office.  She is still okay for diet and anticoagulation/antiplatelet therapy from orthopedic perspective.  Weightbearing as tolerated bilateral lower extremities.  No further operative plans at this time   London Sheer, MD Orthopedic Surgeon

## 2023-03-19 DIAGNOSIS — I63341 Cerebral infarction due to thrombosis of right cerebellar artery: Secondary | ICD-10-CM | POA: Diagnosis not present

## 2023-03-19 DIAGNOSIS — E43 Unspecified severe protein-calorie malnutrition: Secondary | ICD-10-CM | POA: Diagnosis not present

## 2023-03-19 DIAGNOSIS — S72142A Displaced intertrochanteric fracture of left femur, initial encounter for closed fracture: Secondary | ICD-10-CM | POA: Diagnosis not present

## 2023-03-19 DIAGNOSIS — R569 Unspecified convulsions: Secondary | ICD-10-CM | POA: Diagnosis not present

## 2023-03-19 LAB — CBC
HCT: 25.3 % — ABNORMAL LOW (ref 36.0–46.0)
Hemoglobin: 8.2 g/dL — ABNORMAL LOW (ref 12.0–15.0)
MCH: 31.2 pg (ref 26.0–34.0)
MCHC: 32.4 g/dL (ref 30.0–36.0)
MCV: 96.2 fL (ref 80.0–100.0)
Platelets: 173 10*3/uL (ref 150–400)
RBC: 2.63 MIL/uL — ABNORMAL LOW (ref 3.87–5.11)
RDW: 17.1 % — ABNORMAL HIGH (ref 11.5–15.5)
WBC: 4.9 10*3/uL (ref 4.0–10.5)
nRBC: 0 % (ref 0.0–0.2)

## 2023-03-19 LAB — COMPREHENSIVE METABOLIC PANEL
ALT: <5 U/L (ref 0–44)
AST: 18 U/L (ref 15–41)
Albumin: 2 g/dL — ABNORMAL LOW (ref 3.5–5.0)
Alkaline Phosphatase: 77 U/L (ref 38–126)
Anion gap: 17 — ABNORMAL HIGH (ref 5–15)
BUN: 45 mg/dL — ABNORMAL HIGH (ref 8–23)
CO2: 21 mmol/L — ABNORMAL LOW (ref 22–32)
Calcium: 9 mg/dL (ref 8.9–10.3)
Chloride: 94 mmol/L — ABNORMAL LOW (ref 98–111)
Creatinine, Ser: 7.8 mg/dL — ABNORMAL HIGH (ref 0.44–1.00)
GFR, Estimated: 5 mL/min — ABNORMAL LOW (ref >60)
Glucose, Bld: 254 mg/dL — ABNORMAL HIGH (ref 70–99)
Potassium: 3.5 mmol/L (ref 3.5–5.1)
Sodium: 132 mmol/L — ABNORMAL LOW (ref 135–145)
Total Bilirubin: 0.5 mg/dL (ref 0.3–1.2)
Total Protein: 5.3 g/dL — ABNORMAL LOW (ref 6.5–8.1)

## 2023-03-19 LAB — IRON AND TIBC
Iron: 75 ug/dL (ref 28–170)
Saturation Ratios: 56 % — ABNORMAL HIGH (ref 10.4–31.8)
TIBC: 133 ug/dL — ABNORMAL LOW (ref 250–450)
UIBC: 58 ug/dL

## 2023-03-19 LAB — MAGNESIUM
Magnesium: 1.9 mg/dL (ref 1.7–2.4)
Magnesium: 1.9 mg/dL (ref 1.7–2.4)

## 2023-03-19 LAB — PHOSPHORUS
Phosphorus: 6.9 mg/dL — ABNORMAL HIGH (ref 2.5–4.6)
Phosphorus: 3.3 mg/dL (ref 2.5–4.6)

## 2023-03-19 LAB — GLUCOSE, CAPILLARY
Glucose-Capillary: 248 mg/dL — ABNORMAL HIGH (ref 70–99)
Glucose-Capillary: 219 mg/dL — ABNORMAL HIGH (ref 70–99)
Glucose-Capillary: 305 mg/dL — ABNORMAL HIGH (ref 70–99)
Glucose-Capillary: 292 mg/dL — ABNORMAL HIGH (ref 70–99)

## 2023-03-19 LAB — HEPARIN LEVEL (UNFRACTIONATED): Heparin Unfractionated: 0.49 [IU]/mL (ref 0.30–0.70)

## 2023-03-19 MED ORDER — SODIUM CHLORIDE 0.9 % IV SOLN: 250.0000 mg | INTRAVENOUS | Status: DC

## 2023-03-19 MED ORDER — LEVETIRACETAM IN NACL 500 MG/100ML IV SOLN
500.0000 mg | INTRAVENOUS | Status: DC
  Administered 2023-03-19: 500 mg via INTRAVENOUS

## 2023-03-19 MED ORDER — SODIUM CHLORIDE 0.9 % IV SOLN
250.0000 mg | Freq: Once | INTRAVENOUS | Status: AC
  Administered 2023-03-19: 250 mg via INTRAVENOUS
  Filled 2023-03-19: qty 2.5

## 2023-03-19 MED ORDER — LEVETIRACETAM IN NACL 500 MG/100ML IV SOLN
500.0000 mg | INTRAVENOUS | Status: DC
  Administered 2023-03-20: 500 mg via INTRAVENOUS
  Filled 2023-03-19: qty 100

## 2023-03-19 MED ORDER — LEVETIRACETAM IN NACL 500 MG/100ML IV SOLN: 500.0000 mg | Freq: Every day | INTRAVENOUS | Status: DC

## 2023-03-19 MED ORDER — LEVETIRACETAM 500 MG/5ML IV SOLN: 250.0000 mg | INTRAVENOUS | Status: DC

## 2023-03-19 MED ORDER — HEPARIN SODIUM (PORCINE) 1000 UNIT/ML IJ SOLN
3800.0000 [IU] | Freq: Once | INTRAMUSCULAR | Status: AC
  Administered 2023-03-19: 3800 [IU]
  Filled 2023-03-19: qty 4

## 2023-03-19 NOTE — Significant Event (Signed)
TRH floor coverage note:  Per RN: "Can we d/c the dextrose infusion on this patient? We started continuous tube feeds on her and her sugars are stable. 2100 sugar 149 and 0000 was 182. Thanks!"  Will DC the D10 infusion. RN to check CBGs hourly for next couple hours to make sure it remains stable off of D10.

## 2023-03-19 NOTE — Progress Notes (Signed)
PT Cancellation Note  Patient Details Name: Robin [name] MRN: [id number] DOB: [birth year]   Cancelled Treatment:    Reason Eval/Treat Not Completed: Patient at procedure or test/unavailable. Initiated PT session, however, transport arrived to take pt to HD. Acute PT to follow as able.   Hilton Cork, PT, DPT Secure Chat Preferred  Rehab Office [phone]    Arturo Morton Brion Aliment 03/19/2023, 9:17 AM

## 2023-03-19 NOTE — Progress Notes (Signed)
Subjective: Seen in room this a.m. does open eyes to voice otherwise no interaction, for HD today.  Objective Vital signs in last 24 hours: Vitals:   03/18/23 2145 03/19/23 0052 03/19/23 0350 03/19/23 0758  BP:  124/79 131/65 (!) 145/74  Pulse: 73 65 67 65  Resp:  20 15 20   Temp:  97.6 F (36.4 C) 98 F (36.7 C) 97.8 F (36.6 C)  TempSrc:  Axillary Axillary Axillary  SpO2:  100% 100% 100%  Weight:   56.4 kg   Height:       Weight change:   Physical Exam: General: Thin chronically ill-appearing elderly female Heart: Irregular irregular  rate stable, no MRG Lungs: CTA anteriorly nonlabored Abdomen: NABS soft NT ND Extremities: Trace bi pedal edema Dialysis Access: [facility]      Dialysis Orders: MWF  - [facility]  3.5hrs, BFR 300, DFR AF 1.5,  EDW 54.2kg, 2K/ 2Ca UFP 1 Access: AVF, TDC  Heparin none Mircera 30 mcg q4wks - last 10/7 Venofer 100mg  qHD x10 Hectorol IV qHD       Assessment/Plan:  L hip Frx - 2/2 Fall at home.  ORIF on 03/14/23.  Stroke - MRI brain demonstrated a R brachium pontine stroke.  With dysphagia/neuro following. Magdalen Spatz Cortrak" placed 03/18/23 for feeding Tremor -with seizure diagnosis associated with CVA neuro dosing meds    ESRD -  on HD MWF schedule continue to use BFR 300(with cardiac history)  Hypertension/volume  -  BP improving prior lowish/10/20  meds held.  IV metoprolol 2.5 mg every 6 hours does not appearing volume overloaded, meeting edw at outpatient. UF as tolerated.   Anemia of CKD - Hgb 8.2 <7.5 <8.4. Will defer to primary to at least check FOBT. Transfuse PRN for hgb <7. Will check Fe panel  Secondary Hyperparathyroidism -corrected Ca evaded, phos 6.9 <7.5 renvela powder when diet started, hold  VDRA with^ Ca   Nutrition -ALB 2.0 per daughter asking for for feeding tube placement ("trial Cortrak" ) inserted 10/23/with Osmolite started [employer] supplement DMT2-per primary Hx CAD  Lenny Pastel, PA-C [facility] Beeper [phone] 03/19/2023,8:28 AM  LOS: 7 days   Labs: Basic Metabolic Panel: Recent Labs  Lab 03/17/23 0627 03/18/23 1555 03/19/23 0531  NA 139 137 132*  K 3.5 3.1* 3.5  CL 98 103 94*  CO2 24 19* 21*  GLUCOSE 64* 127* 254*  BUN 30* 36* 45*  CREATININE 5.25* 6.35* 7.80*  CALCIUM 8.9 8.1* 9.0  PHOS 6.5* 6.0* 6.9*   Liver Function Tests: Recent Labs  Lab 03/17/23 0627 03/18/23 1555 03/19/23 0531  AST 17 18 18   ALT <5 <5 <5  ALKPHOS 70 64 77  BILITOT 0.6 0.4 0.5  PROT 5.7* 4.7* 5.3*  ALBUMIN 2.2* 1.8* 2.0*   No results for input(s): "LIPASE", "AMYLASE" in the last 168 hours. Recent Labs  Lab 03/16/23 0456  AMMONIA 32   CBC: Recent Labs  Lab 03/12/23 1702 03/12/23 1813 03/15/23 0930 03/16/23 0456 03/16/23 1446 03/17/23 0627 03/17/23 1814 03/18/23 0423 03/19/23 0531  WBC 8.4   < > 12.1* 8.4  --  5.4  --  5.0 4.9  NEUTROABS 6.8  --   --   --   --   --   --   --   --   HGB 10.0*   < > 10.1* 8.4*   < > 7.5* 8.9* 7.8* 8.2*  HCT 31.4*   < > 31.7* 26.0*   < > 23.8* 28.4* 24.2*  25.3*  MCV 96.0   < > 95.8 95.9  --  96.4  --  96.0 96.2  PLT 178   < > 154 155  --  164  --  158 173   < > = values in this interval not displayed.   Cardiac Enzymes: No results for input(s): "CKTOTAL", "CKMB", "CKMBINDEX", "TROPONINI" in the last 168 hours. CBG: Recent Labs  Lab 03/18/23 1841 03/18/23 2118 03/18/23 2352 03/19/23 0357 03/19/23 0645  GLUCAP 123* 149* 182* 248* 219*    Studies/Results: MR BRAIN WO CONTRAST  Result Date: 03/18/2023 CLINICAL DATA:  Stroke follow-up EXAM: MRI HEAD WITHOUT CONTRAST TECHNIQUE: Multiplanar, multiecho pulse sequences of the brain and surrounding structures were obtained without intravenous contrast. COMPARISON:  03/12/2023 FINDINGS: Brain: Expected evolution of diffusion changes at the recent right middle cerebellar peduncle infarct site. No new area acute ischemia. No acute or chronic hemorrhage. There is multifocal  hyperintense T2-weighted signal within the white matter. Generalized volume loss. The midline structures are normal. Vascular: Normal flow voids. Skull and upper cervical spine: Normal calvarium and skull base. Visualized upper cervical spine and soft tissues are normal. Sinuses/Orbits:No paranasal sinus fluid levels or advanced mucosal thickening. No mastoid or middle ear effusion. Normal orbits. IMPRESSION: Expected evolution of diffusion changes at the recent right middle cerebellar peduncle infarct site. No new area of acute ischemia. Electronically Signed   By: Deatra Robinson M.D.   On: 03/18/2023 22:59   DG Abd Portable 1V  Result Date: 03/18/2023 CLINICAL DATA:  72 year old female feeding tube placement. EXAM: PORTABLE ABDOMEN - 1 VIEW COMPARISON:  CT Abdomen and Pelvis 08/07/2022. FINDINGS: Portable AP supine view at 0941 hours. Enteric tube courses through the left abdomen and terminates just across midline at the mid abdomen, compatible with gastric antrum placement and a degree of gastric ptosis. Paucity of bowel gas. Negative lung bases. Partially visible dual lumen vascular catheter at the right chest. Aortoiliac calcified atherosclerosis. Stable cholecystectomy clips. No acute osseous abnormality identified. IMPRESSION: 1. Enteric feeding tube tip at the distal stomach. Advance several cm to allow for post pyloric transit. 2. Nonobstructed bowel gas pattern. 3. Aortic Atherosclerosis (ICD10-I70.0). Electronically Signed   By: Odessa Fleming M.D.   On: 03/18/2023 12:14   Medications:  feeding supplement (OSMOLITE 1.5 CAL) 1,000 mL (03/18/23 1832)   heparin 800 Units/hr (03/18/23 1826)   lacosamide (VIMPAT) IV 50 mg (03/19/23 0457)   levETIRAcetam 1,000 mg (03/18/23 1123)   And   levETIRAcetam Stopped (03/16/23 1739)   tranexamic acid       stroke: early stages of recovery book   Does not apply Once   atorvastatin  40 mg Per Tube Daily   Chlorhexidine Gluconate Cloth  6 each Topical Q0600    clopidogrel  75 mg Per Tube Daily   doxercalciferol  5 mcg Intravenous Q M,W,F-HD   feeding supplement (PROSource TF20)  60 mL Per Tube Daily   free water  100 mL Per Tube Q8H   metoprolol tartrate  12.5 mg Per Tube BID   multivitamin  1 tablet Per Tube QHS   mouth rinse  15 mL Mouth Rinse 4 times per day   pantoprazole (PROTONIX) IV  40 mg Intravenous Q24H   PHENObarbital  32.5 mg Intravenous Q1200   sevelamer carbonate  1.6 g Per Tube TID WC   thiamine  100 mg Per Tube Daily

## 2023-03-19 NOTE — Progress Notes (Signed)
PHARMACY - ANTICOAGULATION CONSULT NOTE  Pharmacy Consult for heparin Indication: atrial fibrillation (PTA Eliquis)  No Known Allergies  Patient Measurements: Height: 5' 4.02" (162.6 cm) Weight: 56.4 kg (124 lb 5.4 oz) IBW/kg (Calculated) : 54.74 Heparin Dosing Weight: 52.9 kg  Vital Signs: Temp: 98 F (36.7 C) (10/24 0350) Temp Source: Axillary (10/24 0350) BP: 131/65 (10/24 0350) Pulse Rate: 67 (10/24 0350)  Labs: Recent Labs    03/17/23 0627 03/17/23 1814 03/18/23 0423 03/18/23 1348 03/18/23 1555 03/19/23 0531  HGB 7.5* 8.9* 7.8*  --   --  8.2*  HCT 23.8* 28.4* 24.2*  --   --  25.3*  PLT 164  --  158  --   --  173  HEPARINUNFRC 0.31  --  0.22* 0.41  --  0.49  CREATININE 5.25*  --   --   --  6.35* 7.80*    Estimated Creatinine Clearance: 5.8 mL/min (A) (by C-G formula based on SCr of 7.8 mg/dL (H)).   Medical History: Past Medical History:  Diagnosis Date   Acute delirium 11/18/2014   Acute encephalopathy    Acute on chronic respiratory failure with hypoxia (HCC) 09/09/2016   Acute respiratory failure with hypoxia (HCC) 11/29/2015   Anemia in chronic kidney disease 12/09/2014   Anxiety    Arthritis    Benign hypertension    Bipolar affective disorder (HCC) 12/09/2014   CAD (coronary artery disease), native coronary artery with 2 stents  11/17/2014   Charcot foot due to diabetes mellitus (HCC)    Chronic combined systolic and diastolic CHF (congestive heart failure) (HCC) 11/18/2014   Closed left ankle fracture 11/17/2014   Confusion 01/21/2015   Depression    Diabetes mellitus without complication (HCC)    End stage renal disease on dialysis (HCC)    Fracture dislocation of ankle 11/17/2014   GERD (gastroesophageal reflux disease)    Heart murmur    History of blood transfusion    History of bronchitis    History of pneumonia    Hyperlipidemia 03/26/2015   Hypertension associated with diabetes (HCC) 11/18/2014   Hypertensive heart/renal disease with failure  (HCC) 12/09/2014   Hypokalemia 11/17/2014   Multiple falls 01/21/2015   Nausea & vomiting 01/15/2023   Obesity 11/17/2014   Onychomycosis 10/07/2016   Right leg DVT (HCC) 12/05/2015   SIRS (systemic inflammatory response syndrome) (HCC) 08/08/2016   Sleep apnea    Unstable angina (HCC) 12/26/2017   Ventricular tachycardia [facility])     Assessment: 72 yo female with PMH of DVT in 2017 and Afib on Eliquis (last dose PTA, 10/17 AM) who presented 10/17 with stroke confirmed on CT head / MRI and now s/p L hip replacement 10/19.  Head CT 10/20 with no acute hemorrhage.  Pharmacy consulted for heparin dosing until able to tolerate PO.  Heparin level is therapeutic at 0.49, on 800 units/hr. Hgb 8.2, plt 173. No s/sx of bleeding or infusion issues.   Goal of Therapy:  Heparin level 0.3-0.5 units/ml, no bolus Monitor platelets by anticoagulation protocol: Yes   Plan:  Given on higher end of goal range, will reduce heparin infusion to 750 units/hr Daily heparin level and CBC  Thank you for allowing pharmacy to participate in this patient's care,  Sherron Monday, PharmD, BCCCP Clinical Pharmacist  Phone: [phone] 03/19/2023 7:19 AM  Please check AMION for all [name] Pharmacy phone numbers After 10:00 PM, call Main Pharmacy [phone]

## 2023-03-19 NOTE — Progress Notes (Signed)
OT Cancellation Note  Patient Details Name: Robin [name] MRN: [id number] DOB: 04/25/1951   Cancelled Treatment:    Reason Eval/Treat Not Completed: Patient at procedure or test/ unavailable Initiated OT session though transport arrived to take pt to HD. Will follow up as schedule permits though noted pt likely going to HD tomorrow as well.   Lorre Munroe 03/19/2023, 8:44 AM

## 2023-03-19 NOTE — TOC Progression Note (Signed)
Transition of Care [facility]) - Progression Note    Patient Details  Name: Robin [name] MRN: [id number] Date of Birth: January 07, 1951  [facility] CM/SW Contact  Delilah Shan, LCSWA Phone Number: 03/19/2023, 3:45 PM  Clinical Narrative:     Patient has SNF bed at [facility] place.CSW following to start insurance authorization closer to patient being medically ready for dc.   Expected Discharge Plan: Skilled Nursing Facility Barriers to Discharge: Continued Medical Work up  Expected Discharge Plan and Services In-house Referral: Clinical Social Work     Living arrangements for the past 2 months: Single Family Home                                       Social Determinants of Health (SDOH) Interventions SDOH Screenings   Food Insecurity: No Food Insecurity (01/15/2023)  Housing: Patient Declined (01/15/2023)  Transportation Needs: No Transportation Needs (01/15/2023)  Utilities: Not At Risk (01/15/2023)  Tobacco Use: High Risk (03/14/2023)    Readmission Risk Interventions     No data to display

## 2023-03-19 NOTE — Progress Notes (Signed)
Addendum= patient on dialysis today because of missed HD yesterday with staff shortage in unit   Will have HD today Thursday Saturday and then Monday to get back on schedule

## 2023-03-19 NOTE — Progress Notes (Signed)
PROGRESS NOTE    Robin [name]  WUJ:[id number] DOB: 06-Sep-1950 DOA: 03/22/2023 PCP: [facility]   Brief Narrative: Robin [name] is a 72 y.o. female with a history of ESRD on hemodialysis, CAD status post PCI with stent placement, heart failure with reduced EF, diabetes mellitus type 2, hypertension.  Patient presented secondary to fall with subsequent left hip pain and was found to have evidence of a left hip fracture.  Patient underwent left intertrochanteric IM rodding on 10/19, however on 10/20, patient was found to have intermittent facial tremors with bilateral upper extremity tremors with confirmation of status epilepticus on EEG.  Neurology was consulted.  Status epilepticus resolved after phenobarbital administration.  During workup, patient was also found to have evidence of an acute versus early subacute infarct involving the right brachium pontis.  NG tube placed on 10/23 for nutrition.   Assessment and Plan:  Status epilepticus New seizure diagnosis. Episode occurred after surgery. Associated CVA. Neurology consulted. Patient noted to have evidence of status epilepticus on EEG. Status epilepticus resolved after phenobarbital. MRI brain repeated and is stable. -Neurology recommendations: phenobarbital taper, discontinue Vimpat, titrating down Keppra  Acute vs early subacute infarct MRI brain 03/22/23) confirms acute vs early subacute infarct involving the right brachium pontis. MRA head without proximal large vessel occlusion or high-grade stenosis. LDL of 105. Hemoglobin A1C of 7.7%. Transthoracic Echocardiogram with bubble study obtained on 10/20 and was significant for an LVEF of 40-45% with no thrombus or atrial level shunt. Neurology recommendations for continued anticoagulation with Eliquis. Recommendation for Neurology follow-up in 4 weeks.  Dysphagia Secondary to acute stroke seizures with resultant encephalopathy.  NG Cortrak placed on 10/23.  Discussed  long-term nutrition goals with daughter at bedside.  Daughter would be okay with PEG tube for feeding if patient's quality of life would benefit however she would decline PEG if good quality of life was not achievable -Continue Cortrak and tube feeds  Paroxysmal atrial fibrillation with RVR -Continue metoprolol and heparin IV  Closed left hip fracture Secondary to fall.  Orthopedic surgery was consulted on admission and performed left intertrochanteric fracture IM rodding on 10/19.  Therapy held secondary to encephalopathy and seizures.  Acute anemia Unclear etiology. Complicated by anticoagulation. Anemia panel suggests chronic illness picture. No obvious evidence of hemorrhaging/GI bleeding. -FOBT -Continue Heparin IV  Diabetes mellitus type 2 Well-controlled with hemoglobin A1c of 7.7%.  Patient has been having hypoglycemia this admission secondary to poor oral intake in addition to insulin administration.  Insulin held.  D10 fluids initially started to help with ischemia.  Patient is now on tube feeds via NG Cortrak tube.  CAD -Continue heparin and Plavix  History of PE -Continue Heparin IV with eventual transition to DOAC  ESRD on HD Nephrology consulted.  Patient is currently on a Monday, Wednesday, Friday schedule for hemodialysis.  Bipolar disorder Patient is on Abilify as an outpatient which was held secondary to inability to take p.o.  Primary hypertension Uncontrolled, possibly secondary to missed HD yesterday. Has had hypotension yesterday -Continue metoprolol   Chronic heart failure with reduced EF Last echocardiogram with an LVEF of 45 to 50% with associated grade 2 diastolic dysfunction.  Patient is euvolemic.  Severe protein calorie malnutrition As diagnosed by dietitian.  Patient is currently on tube feeds.  Cortrak secondary to dysphagia.  Goals of care Palliative care has been consulted and is following.    DVT prophylaxis: Heparin drip Code Status:    Code Status: Limited:  Do not attempt resuscitation (DNR) -DNR-LIMITED -Do Not Intubate/DNI  Family Communication: Daughter at bedside Disposition Plan: Discharge is pending improvement of encephalopathy   Consultants:  Orthopedic surgery Nephrology Cardiology Palliative care medicine  Procedures:  10/20 > 10/23 LTM EEG  Antimicrobials: None    Subjective: Patient seen in hemodialysis. No responding verbally to my questions.  Objective: BP (!) 198/68 (BP Location: Left Arm)   Pulse 93   Temp 98.1 F (36.7 C)   Resp 18   Ht 5' 4.02" (1.626 m)   Wt 57.1 kg   SpO2 100%   BMI 21.60 kg/m   Examination:  General exam: Appears calm and comfortable Respiratory system: Clear to auscultation. Respiratory effort normal. Cardiovascular system: S1 & S2 heard, RRR.  Gastrointestinal system: Abdomen is nondistended, soft and nontender. Normal bowel sounds heard. Central nervous system: Won't open her eyes to questions but does mumble when asked questions. Seems to move extremities spontaneously   Data Reviewed: I have personally reviewed following labs and imaging studies  CBC Lab Results  Component Value Date   WBC 4.9 03/19/2023   RBC 2.63 (L) 03/19/2023   HGB 8.2 (L) 03/19/2023   HCT 25.3 (L) 03/19/2023   MCV 96.2 03/19/2023   MCH 31.2 03/19/2023   PLT 173 03/19/2023   MCHC 32.4 03/19/2023   RDW 17.1 (H) 03/19/2023   LYMPHSABS 0.8 03-17-23   MONOABS 0.7 03/17/23   EOSABS 0.0 2023/03/17   BASOSABS 0.0 03-17-2023     Last metabolic panel Lab Results  Component Value Date   NA 132 (L) 03/19/2023   K 3.5 03/19/2023   CL 94 (L) 03/19/2023   CO2 21 (L) 03/19/2023   BUN 45 (H) 03/19/2023   CREATININE 7.80 (H) 03/19/2023   GLUCOSE 254 (H) 03/19/2023   GFRNONAA 5 (L) 03/19/2023   GFRAA 5 (L) 01/31/2020   CALCIUM 9.0 03/19/2023   PHOS 6.9 (H) 03/19/2023   PROT 5.3 (L) 03/19/2023   ALBUMIN 2.0 (L) 03/19/2023   BILITOT 0.5 03/19/2023   ALKPHOS 77 03/19/2023    AST 18 03/19/2023   ALT <5 03/19/2023   ANIONGAP 17 (H) 03/19/2023    GFR: Estimated Creatinine Clearance: 5.8 mL/min (A) (by C-G formula based on SCr of 7.8 mg/dL (H)).  No results found for this or any previous visit (from the past 240 hour(s)).    Radiology Studies: MR BRAIN WO CONTRAST  Result Date: 03/18/2023 CLINICAL DATA:  Stroke follow-up EXAM: MRI HEAD WITHOUT CONTRAST TECHNIQUE: Multiplanar, multiecho pulse sequences of the brain and surrounding structures were obtained without intravenous contrast. COMPARISON:  March 17, 2023 FINDINGS: Brain: Expected evolution of diffusion changes at the recent right middle cerebellar peduncle infarct site. No new area acute ischemia. No acute or chronic hemorrhage. There is multifocal hyperintense T2-weighted signal within the white matter. Generalized volume loss. The midline structures are normal. Vascular: Normal flow voids. Skull and upper cervical spine: Normal calvarium and skull base. Visualized upper cervical spine and soft tissues are normal. Sinuses/Orbits:No paranasal sinus fluid levels or advanced mucosal thickening. No mastoid or middle ear effusion. Normal orbits. IMPRESSION: Expected evolution of diffusion changes at the recent right middle cerebellar peduncle infarct site. No new area of acute ischemia. Electronically Signed   By: Deatra Robinson M.D.   On: 03/18/2023 22:59   DG Abd Portable 1V  Result Date: 03/18/2023 CLINICAL DATA:  72 year old female feeding tube placement. EXAM: PORTABLE ABDOMEN - 1 VIEW COMPARISON:  CT Abdomen and Pelvis 08/07/2022. FINDINGS: Portable AP supine view  at 0941 hours. Enteric tube courses through the left abdomen and terminates just across midline at the mid abdomen, compatible with gastric antrum placement and a degree of gastric ptosis. Paucity of bowel gas. Negative lung bases. Partially visible dual lumen vascular catheter at the right chest. Aortoiliac calcified atherosclerosis. Stable  cholecystectomy clips. No acute osseous abnormality identified. IMPRESSION: 1. Enteric feeding tube tip at the distal stomach. Advance several cm to allow for post pyloric transit. 2. Nonobstructed bowel gas pattern. 3. Aortic Atherosclerosis (ICD10-I70.0). Electronically Signed   By: Odessa Fleming M.D.   On: 03/18/2023 12:14      LOS: 7 days    Jacquelin Hawking, MD [facility] 03/19/2023, 12:23 PM   If 7PM-7AM, please contact night-coverage [email or web address]

## 2023-03-19 NOTE — Progress Notes (Signed)
SLP Cancellation Note  Patient Details Name: NIKKIA RUOTOLO MRN: [id number] DOB: 07/21/1950   Cancelled treatment:        Pt off floor for HD and unable to participate in PO trials at this time.  SLP will continue to follow for PO readiness.    Kerrie Pleasure, MA, CCC-SLP Acute Rehabilitation Services Office: [phone] 03/19/2023, 9:23 AM

## 2023-03-19 NOTE — Progress Notes (Signed)
Daily Progress Note   [name]       Date: 03/19/2023 DOB: [birth year]  Age: 72 y.o. MRN#: [id number] Attending Physician: Narda Bonds, MD Primary Care Physician: [facility] Medical Admit Date: 03/16/2023  Reason for Consultation/Follow-up: Establishing goals of care  Subjective: Medical records reviewed including progress notes, labs, imaging. Patient assessed at the bedside. She is resting comfortably after returning from HD.  Her daughter Robin [name] is present visiting.  Patient's daughter Robin [name] shares that she is more alert today, even stating "I want to go home" which Robin [name] has previously indicated would be her marker for patient's improvement.  Patient has been able to acknowledge that she needs to go to the bathroom and overall more interactive.  Robin [name] remains cautiously optimistic that patient will be able to have some quality of life moving forward, while acknowledging that she may also decline rapidly instead.  MRI results were reviewed with patient's daughter and she is appreciative.  She would like to continue with the current care plan for now and avoid escalation of care, though she may consider certain interventions if necessary in light of patient's mental status changes today.  She continues to take things 1 day at a time she is appreciative and support.    I informed her that I would be back on service on Monday, though she was encouraged to contact PMT if needs arise in the meantime.  Questions and concerns addressed. PMT will continue to support holistically.   Length of Stay: 7   Physical Exam Vitals and nursing note reviewed.  Constitutional:      General: She is not in acute distress.    Appearance: She is ill-appearing.  Cardiovascular:      Rate and Rhythm: Normal rate.  Pulmonary:     Effort: Pulmonary effort is normal.  Skin:    General: Skin is warm and dry.  Neurological:     Mental Status: She is unresponsive.           Vital Signs: BP (!) 144/85   Pulse 71   Temp 98.1 F (36.7 C)   Resp 20   Ht 5' 4.02" (1.626 m)   Wt 57.1 kg   SpO2 100%   BMI 21.60 kg/m  SpO2: SpO2: 100 % O2 Device: O2 Device: Nasal Cannula O2 Flow Rate: O2 Flow Rate (L/min): 2 L/min      Palliative Assessment/Data: 30% (on tube feeds)   Palliative Care Assessment & Plan   Patient Profile: 72 y.o. female  with past medical history of ESRD on HD MWF, coronary artery disease status post PCI with stent placement March 2024, HFrEF 45 to 50%, type 2 diabetes, hypertension, bipolar disorder, DVT, admitted on 03/24/2023 with fall at home.    In the ED, workup revealed left intertrochanteric hip fracture seen on x-ray.  She underwent surgical repair and was also found to have an acute to early subacute ischemic stroke on arrival.  Unfortunately, she developed seizures/status epilepticus after surgery, as well as A-fib with RVR and dysphagia. PMT has been consulted to assist with goals of care conversation.  Assessment: Goals of care conversation Acute versus early subacute infarct Dysphagia Left hip  fracture status post surgical repair Status epilepticus, resolved  Recommendations/Plan: Continue DNR/DNI Continue current care plan, allow more time for outcomes.  Daughter is pleased with patient's mental status improvement today, understands prognosis remains tenuous Ongoing goals of care discussions pending clinical course Psychosocial and emotional support provided PMT will continue to follow and support   Prognosis: Guarded to poor given several chronic comorbidities and rapid functional/nutritional/cognitive decline   Discharge Planning: Skilled Nursing Facility for rehab with Palliative care service follow-up  Care plan was  discussed with patient's daughter   Total time: I spent 35 minutes in the care of the patient today in the above activities and documenting the encounter.          Mushka Laconte Jeni Salles, PA-C  Palliative Medicine Team Team phone # [phone]  Thank you for allowing the Palliative Medicine Team to assist in the care of this patient. Please utilize secure chat with additional questions, if there is no response within 30 minutes please call the above phone number.  Palliative Medicine Team providers are available by phone from 7am to 7pm daily and can be reached through the team cell phone.  Should this patient require assistance outside of these hours, please call the patient's attending physician.

## 2023-03-19 NOTE — Progress Notes (Signed)
   03/19/23 1230  Vitals  Temp 97.8 F (36.6 C)  Temp Source Oral  BP (!) 187/64  BP Location Left Arm  BP Method Automatic  Patient Position (if appropriate) Lying  Pulse Rate 86  Pulse Rate Source Monitor  ECG Heart Rate 86  Resp (!) 28  During Treatment Monitoring  Blood Flow Rate (mL/min) 0 mL/min  Arterial Pressure (mmHg) 0 mmHg  Venous Pressure (mmHg) -0.4 mmHg  TMP (mmHg) 11.31 mmHg  Ultrafiltration Rate (mL/min) 82 mL/min  Dialysate Flow Rate (mL/min) 299 ml/min  Duration of HD Treatment -hour(s) 3 hour(s)  Cumulative Fluid Removed (mL) per Treatment  0.01  Post Treatment  Dialyzer Clearance Clear  Hemodialysis Intake (mL) 0 mL  Liters Processed 54  Fluid Removed (mL) 0 mL  Tolerated HD Treatment Yes  Post-Hemodialysis Comments no problems to note  Hemodialysis Catheter Right Internal jugular  Placement Date/Time: 10/18/22 1017   Serial / Lot #: [id number]  Expiration Date: 01/02/27  Time Out: Correct patient;Correct procedure;Correct site  Site Prep: Chlorhexidine (preferred)  Local Anesthetic: Injectable - 1% Lidocaine  Ultrasound Used?: ...  Site Condition No complications  Blue Lumen Status Flushed  Red Lumen Status Flushed  Purple Lumen Status N/A  Catheter fill solution Heparin 1000 units/ml  Catheter fill volume (Arterial) 1.9 cc  Catheter fill volume (Venous) 1.9  Dressing Type Transparent  Dressing Status Antimicrobial disc in place  Drainage Description None  Post treatment catheter status Capped and Clamped   Received patient in bed to unit.  Alert and oriented.  Informed consent signed and in chart.   TX duration:3  Patient tolerated well.  Transported back to the room  Alert, without acute distress.  Hand-off given to patient's nurse.   Access used: RT chest HD cath Access issues: none  Total UF removed: 0  Medication(s) given: none Post HD VS: see above Post HD weight: n/a   [facility]

## 2023-03-19 NOTE — Progress Notes (Signed)
Subjective: No acute events overnight.  Was receiving hemodialysis today.  ROS: Unable to obtain due to poor mental status  Examination  Vital signs in last 24 hours: Temp:  [97.4 F (36.3 C)-98.1 F (36.7 C)] 98.1 F (36.7 C) (10/24 0845) Pulse Rate:  [62-110] 93 (10/24 1130) Resp:  [10-23] 18 (10/24 1130) BP: (124-198)/(58-85) 198/68 (10/24 1130) SpO2:  [100 %] 100 % (10/24 1030) Weight:  [56.4 kg-57.1 kg] 57.1 kg (10/24 0845)  General: lying in bed, NAD Neuro: Awake, looks at examiner, able to tell me her name, did not name objects, attempted to follow commands like sticking out her tongue and squeezing hand but not consistently, cranial nerves appear grossly intact, increased tone in bilateral upper extremity but spontaneously moving all 4 extremities with antigravity strength  Basic Metabolic Panel: Recent Labs  Lab 03/14/23 0448 03/15/23 0930 03/16/23 0456 03/17/23 0627 03/18/23 1555 03/19/23 0531  NA 135 133* 136 139 137 132*  K 3.8 5.1 4.0 3.5 3.1* 3.5  CL 88* 90* 94* 98 103 94*  CO2 17* 19* 23 24 19* 21*  GLUCOSE 252* 226* 97 64* 127* 254*  BUN 16 36* 44* 30* 36* 45*  CREATININE 4.20* 6.17* 7.38* 5.25* 6.35* 7.80*  CALCIUM 10.0 9.7 9.5 8.9 8.1* 9.0  MG 2.0  --   --  1.9 1.7 1.9  PHOS 7.0* 7.5*  --  6.5* 6.0* 6.9*    CBC: Recent Labs  Lab 03/12/23 1702 03/12/23 1813 03/15/23 0930 03/16/23 0456 03/16/23 1446 03/17/23 0627 03/17/23 1814 03/18/23 0423 03/19/23 0531  WBC 8.4   < > 12.1* 8.4  --  5.4  --  5.0 4.9  NEUTROABS 6.8  --   --   --   --   --   --   --   --   HGB 10.0*   < > 10.1* 8.4* 9.1* 7.5* 8.9* 7.8* 8.2*  HCT 31.4*   < > 31.7* 26.0* 28.7* 23.8* 28.4* 24.2* 25.3*  MCV 96.0   < > 95.8 95.9  --  96.4  --  96.0 96.2  PLT 178   < > 154 155  --  164  --  158 173   < > = values in this interval not displayed.     Coagulation Studies: No results for input(s): "LABPROT", "INR" in the last 72 hours.  Imaging personally reviewed MRI brain  without contrast 03/18/2023: Expected evolution of diffusion changes at the recent right middle cerebellar peduncle infarct site. No new area of acute ischemia.    ASSESSMENT AND PLAN: 72 year old female with history of hypertension, diabetes, hyperlipidemia, coronary artery disease, end-stage renal disease on hemodialysis (Monday Wednesday Friday), A-fib on Eliquis who had a fall at home and subsequently had left hip fracture.  While in the hospital was also found to have right cerebellar infarct and has seen by stroke team.  However yesterday patient was noted to have right facial twitching and upper extremity tremor-like movements.  EEG was obtained which showed generalized spikes and polyspikes.  Therefore patient was started on Depakote.  Long-term EEG continued to show status epilepticus.  Vimpat and subsequently Keppra was added.  However, status epilepticus has not resolved.   Status epilepticus, resolved -Reportedly patient has history of PEA arrest in February 2023.  With the current EEG pattern, it is possible that patient had seizure recurrence due to lowered seizure threshold in the setting of hip fracture and subsequent management   Recommendations -Continue phenobarbital taper to minimize sedation -  Discontinue Vimpat -Reduce Keppra to 500 mg daily with an additional 250 mg dose after dialysis on Monday Wednesday Friday -Given patient's age, history of cardiac arrest, seizures and other comorbidities as well as poor neurocognitive reserve, it might take patient prolonged time to gradually improve.  In the meantime would recommend continuing delirium precautions and minimizing any other sedating meds -Continue seizure precautions -Discussed plan with daughter at bedside -Updated Dr. Caleb Popp about the plan by secure chat   I have spent a total of 36 minutes with the patient reviewing hospital notes,  test results, labs and examining the patient as well as establishing an assessment and  plan that was discussed personally with the patient.  > 50% of time was spent in direct patient care.      Lindie Spruce Epilepsy [facility] For questions after 5pm please refer to AMION to reach the Neurologist on call

## 2023-03-20 DIAGNOSIS — Z7189 Other specified counseling: Secondary | ICD-10-CM

## 2023-03-20 DIAGNOSIS — R569 Unspecified convulsions: Secondary | ICD-10-CM | POA: Diagnosis not present

## 2023-03-20 DIAGNOSIS — E43 Unspecified severe protein-calorie malnutrition: Secondary | ICD-10-CM | POA: Diagnosis not present

## 2023-03-20 DIAGNOSIS — I63341 Cerebral infarction due to thrombosis of right cerebellar artery: Secondary | ICD-10-CM | POA: Diagnosis not present

## 2023-03-20 DIAGNOSIS — Z515 Encounter for palliative care: Secondary | ICD-10-CM

## 2023-03-20 DIAGNOSIS — S72142A Displaced intertrochanteric fracture of left femur, initial encounter for closed fracture: Secondary | ICD-10-CM | POA: Diagnosis not present

## 2023-03-20 LAB — CBC
HCT: 22.4 % — ABNORMAL LOW (ref 36.0–46.0)
Hemoglobin: 7.3 g/dL — ABNORMAL LOW (ref 12.0–15.0)
MCH: 31.2 pg (ref 26.0–34.0)
MCHC: 32.6 g/dL (ref 30.0–36.0)
MCV: 95.7 fL (ref 80.0–100.0)
Platelets: 181 10*3/uL (ref 150–400)
RBC: 2.34 MIL/uL — ABNORMAL LOW (ref 3.87–5.11)
RDW: 17.1 % — ABNORMAL HIGH (ref 11.5–15.5)
WBC: 6.2 10*3/uL (ref 4.0–10.5)
nRBC: 0 % (ref 0.0–0.2)

## 2023-03-20 LAB — COMPREHENSIVE METABOLIC PANEL
ALT: <5 U/L (ref 0–44)
AST: 17 U/L (ref 15–41)
Albumin: 2 g/dL — ABNORMAL LOW (ref 3.5–5.0)
Alkaline Phosphatase: 86 U/L (ref 38–126)
Anion gap: 14 (ref 5–15)
BUN: 31 mg/dL — ABNORMAL HIGH (ref 8–23)
CO2: 25 mmol/L (ref 22–32)
Calcium: 9.3 mg/dL (ref 8.9–10.3)
Chloride: 93 mmol/L — ABNORMAL LOW (ref 98–111)
Creatinine, Ser: 4.45 mg/dL — ABNORMAL HIGH (ref 0.44–1.00)
GFR, Estimated: 10 mL/min — ABNORMAL LOW (ref >60)
Glucose, Bld: 342 mg/dL — ABNORMAL HIGH (ref 70–99)
Potassium: 4 mmol/L (ref 3.5–5.1)
Sodium: 132 mmol/L — ABNORMAL LOW (ref 135–145)
Total Bilirubin: 0.7 mg/dL (ref 0.3–1.2)
Total Protein: 5.1 g/dL — ABNORMAL LOW (ref 6.5–8.1)

## 2023-03-20 LAB — GLUCOSE, CAPILLARY
Glucose-Capillary: 181 mg/dL — ABNORMAL HIGH (ref 70–99)
Glucose-Capillary: 228 mg/dL — ABNORMAL HIGH (ref 70–99)
Glucose-Capillary: 299 mg/dL — ABNORMAL HIGH (ref 70–99)
Glucose-Capillary: 303 mg/dL — ABNORMAL HIGH (ref 70–99)
Glucose-Capillary: 335 mg/dL — ABNORMAL HIGH (ref 70–99)
Glucose-Capillary: 352 mg/dL — ABNORMAL HIGH (ref 70–99)

## 2023-03-20 LAB — HEPATITIS PANEL, ACUTE
HCV Ab: NONREACTIVE
Hep A IgM: NONREACTIVE
Hep B C IgM: NONREACTIVE
Hepatitis B Surface Ag: NONREACTIVE

## 2023-03-20 LAB — MRSA NEXT GEN BY PCR, NASAL: MRSA by PCR Next Gen: NOT DETECTED

## 2023-03-20 LAB — HEPARIN LEVEL (UNFRACTIONATED)
Heparin Unfractionated: 0.2 [IU]/mL — ABNORMAL LOW (ref 0.30–0.70)
Heparin Unfractionated: 0.14 [IU]/mL — ABNORMAL LOW (ref 0.30–0.70)

## 2023-03-20 LAB — MAGNESIUM: Magnesium: 2 mg/dL (ref 1.7–2.4)

## 2023-03-20 LAB — PHOSPHORUS: Phosphorus: 3.6 mg/dL (ref 2.5–4.6)

## 2023-03-20 MED ORDER — INSULIN ASPART 100 UNIT/ML IJ SOLN
0.0000 [IU] | INTRAMUSCULAR | Status: DC
  Administered 2023-03-20: 5 [IU] via SUBCUTANEOUS
  Administered 2023-03-20: 3 [IU] via SUBCUTANEOUS
  Administered 2023-03-20: 7 [IU] via SUBCUTANEOUS
  Administered 2023-03-20: 2 [IU] via SUBCUTANEOUS
  Administered 2023-03-21 (×3): 3 [IU] via SUBCUTANEOUS

## 2023-03-20 MED ORDER — LEVETIRACETAM 250 MG PO TABS
250.0000 mg | ORAL_TABLET | ORAL | Status: DC
  Administered 2023-03-20: 250 mg via ORAL
  Filled 2023-03-20: qty 1

## 2023-03-20 MED ORDER — LEVETIRACETAM 250 MG PO TABS
250.0000 mg | ORAL_TABLET | Freq: Once | ORAL | Status: DC
Start: 2023-03-21 — End: 2023-03-21

## 2023-03-20 MED ORDER — DARBEPOETIN ALFA 100 MCG/0.5ML IJ SOSY
100.0000 ug | PREFILLED_SYRINGE | INTRAMUSCULAR | Status: DC
Start: 2023-03-20 — End: 2023-03-21
  Administered 2023-03-20: 100 ug via SUBCUTANEOUS
  Filled 2023-03-20: qty 0.5

## 2023-03-20 MED ORDER — LEVETIRACETAM 500 MG PO TABS: 500.0000 mg | ORAL_TABLET | Freq: Every day | ORAL | Status: DC

## 2023-03-20 NOTE — Progress Notes (Signed)
Physical Therapy Treatment Patient Details Name: Robin [name] MRN: [id number] DOB: [birth year] Today's Date: 03/20/2023   History of Present Illness 72 yo female presenting after a fall at home resulting in L intertrochanteric hip fx. MRI brain showed acute to early subacute infarct in R brachium pontis. Underwent IM nailing on 10/19. Hospitalization complicated by a fib/tachycardia and seizure activity with initiation of continuous EEG.Brain MRI 10/23 w/ expected evolution of diffuse changes at recent R middle cerebellar peduncle infarct site, no new ischemia. PMHx:  CAD, PCI, ESRD on HD MWF, dementia, bipolar disorder, a-fib, CHF, COPD, DM, HTN, HLD, DVT, R LE amputation (at least transmet).    PT Comments  Pt in bed upon arrival and agreeable to PT session. Pt was more alert in today's session and was able to communicate with a few words spontaneously. Pt would not respond to questions. Worked on transfers and LE strength in today's session. Pt continues to need totalAx2 for all bed mobility to assist with LE management and trunk elevation. Pt has poor sitting balance requiring MaxA to MinA as pt fluctuates between leaning posteriorly, anteriorly, and to the right. Pt was able to stand twice with MaxAx2 and bed pad assist. Pt was hesitant to bear weight on L LE, however, on the second attempt pt was able to shift weight onto L LE without any buckling. Family agreeable to <3hrs post acute rehab at this time. Pt is progressing slowly towards goals. Acute PT to follow.      If plan is discharge home, recommend the following: Two people to help with walking and/or transfers;Assist for transportation;Assistance with cooking/housework;Help with stairs or ramp for entrance   Can travel by private vehicle     No  Equipment Recommendations  Wheelchair cushion (measurements PT);Wheelchair (measurements PT);Hoyer lift;Hospital bed       Precautions / Restrictions Precautions Precautions:  Fall Restrictions Weight Bearing Restrictions: Yes LLE Weight Bearing: Weight bearing as tolerated     Mobility  Bed Mobility Overal bed mobility: Needs Assistance Bed Mobility: Supine to Sit, Sit to Supine     Supine to sit: Total assist, +2 for physical assistance, +2 for safety/equipment, HOB elevated Sit to supine: Total assist, +2 for physical assistance, +2 for safety/equipment   General bed mobility comments: very little initiation for mobility, TotalAx2 for all aspects. Used bed pad to help shift hips towards EOB    Transfers Overall transfer level: Needs assistance Equipment used: 2 person hand held assist Transfers: Sit to/from Stand (x2) Sit to Stand: Max assist, +2 physical assistance, +2 safety/equipment      General transfer comment: MaxAx2 using bed pad to shift hips anteriorly, pt was able to bear more weight on L LE on 2nd stand. Pt had difficulty standing fully upright. Able to hold position for ~15 seconds with MaxAx2          Balance Overall balance assessment: Needs assistance Sitting-balance support: Feet supported, Bilateral upper extremity supported Sitting balance-Leahy Scale: Poor Sitting balance - Comments: Fluctuated between MaxA for support and MinA. Pt also leaned posteriorly, R lateral lean, and anteriorly at times. Cued pt to open eyes and support self with hands with little follow through Postural control: Posterior lean, Right lateral lean (forward lean) Standing balance support: Bilateral upper extremity supported, During functional activity, Reliant on assistive device for balance Standing balance-Leahy Scale: Poor Standing balance comment: reliant on UE support, MaxAx2 to stand       Cognition Arousal: Alert Behavior During Therapy: Flat affect Overall  Cognitive Status: Difficult to assess     General Comments: pt primarily communicated via head nods, answered some questions with a few words. Pt more alert once seated EOB            General Comments General comments (skin integrity, edema, etc.): VSS on RA      Pertinent Vitals/Pain Pain Assessment Pain Assessment: Faces Faces Pain Scale: Hurts little more Pain Location: L hip Pain Descriptors / Indicators: Grimacing Pain Intervention(s): Limited activity within patient's tolerance, Monitored during session, Repositioned     PT Goals (current goals can now be found in the care plan section) Progress towards PT goals: Progressing toward goals    Frequency    Min 1X/week       AM-PAC PT "6 Clicks" Mobility   Outcome Measure  Help needed turning from your back to your side while in a flat bed without using bedrails?: Total Help needed moving from lying on your back to sitting on the side of a flat bed without using bedrails?: Total Help needed moving to and from a bed to a chair (including a wheelchair)?: Total Help needed standing up from a chair using your arms (e.g., wheelchair or bedside chair)?: Total Help needed to walk in hospital room?: Total Help needed climbing 3-5 steps with a railing? : Total 6 Click Score: 6    End of Session   Activity Tolerance: Patient limited by pain;Patient limited by fatigue Patient left: in bed;with call bell/phone within reach;with bed alarm set;with family/visitor present Nurse Communication: Mobility status PT Visit Diagnosis: Other abnormalities of gait and mobility (R26.89)     Time: 0802-0825 PT Time Calculation (min) (ACUTE ONLY): 23 min  Charges:    $Therapeutic Exercise: 8-22 mins $Therapeutic Activity: 8-22 mins PT General Charges $$ ACUTE PT VISIT: 1 Visit                     Hilton Cork, PT, DPT Secure Chat Preferred  Rehab Office [phone]   Arturo Morton Brion Aliment 03/20/2023, 9:34 AM

## 2023-03-20 NOTE — Progress Notes (Signed)
Speech Language Pathology Treatment: Dysphagia  Patient Details Name: SMANATHA CRUMBLISS MRN: [id number] DOB: 03-07-1951 Today's Date: 03/20/2023 Time: 1012-1020 SLP Time Calculation (min) (ACUTE ONLY): 8 min  Assessment / Plan / Recommendation Clinical Impression  Pt seen for swallowing reassessment.  Her seizures are now controlled; no oral spasm or twitching noted.  Pt tolerated thin liquid by spoon and straw, puree, and solid textures with no clinical s/s of aspiration.  Pt is edentulous and does not typically wear dentures at baseline, but consumes a more or less regular texture diet.  Today pt was unable to bit through graham cracker presented by SLP.  She exhibited good oral clearance of soft solid (cracker in small piece, moistened with applesauce) presented by spoon.  Pt appears safe to resume oral diet at this time.   Recommend mechanical soft solids and thin liquids.    HPI HPI: Mahri SHARNESE URETA is a 72 y.o. female who presented with a fall at home.  Found to have L hip fracture and now s/p surgical repair. MRI 10/17 with infarct in R cerebellar peduncle.  Pt with new seizure activity on 10/20, now resolved.  Pt with medical history significant for ESRD on HD MWF, coronary artery disease status post PCI with stent placement March 2024, HFrEF 45 to 50% (07/29/2022) type 2 diabetes, hypertension, recently admitted by family medicine teaching service at [facility] for chest pain.      SLP Plan  Continue with current plan of care      Recommendations for follow up therapy are one component of a multi-disciplinary discharge planning process, led by the attending physician.  Recommendations may be updated based on patient status, additional functional criteria and insurance authorization.    Recommendations  Diet recommendations: Dysphagia 3 (mechanical soft);Thin liquid Liquids provided via: Straw Medication Administration:  (as tolerated, crush if needed) Supervision: Staff to assist with self  feeding Compensations: Slow rate;Small sips/bites Postural Changes and/or Swallow Maneuvers: Seated upright 90 degrees                  Oral care BID     Dysphagia, unspecified (R13.10)     Continue with current plan of care     Kerrie Pleasure, MA, CCC-SLP Acute Rehabilitation Services Office: [phone] 03/20/2023, 10:25 AM

## 2023-03-20 NOTE — Progress Notes (Signed)
[facility] Progress Note   Subjective:  Seen in room - drowsy but answering in short phrases. Per daughter, did have period of restlessness overnight and tried to pull her lines, but was quite awake yesterday. S/p HD yesterday - did ok per notes, no UF.    Objective Vitals:   03/19/23 2254 03/20/23 0357 03/20/23 0417 03/20/23 0808  BP: (!) 168/64 (!) 150/55  (!) 146/63  Pulse: 85 75  78  Resp: 16 14  16   Temp: 98 F (36.7 C) 98.7 F (37.1 C)  98.2 F (36.8 C)  TempSrc: Oral Axillary  Axillary  SpO2: 98% 97%  100%  Weight:   58.5 kg   Height:       Physical Exam General: Chronically ill appearing woman, NAD. NG tube in place. Heart: RRR; no murmur Lungs: CTA anteriorly Abdomen: soft Extremities: no LE edema Dialysis Access:  [facility]  Additional Objective Labs: Basic Metabolic Panel: Recent Labs  Lab 03/18/23 1555 03/19/23 0531 03/19/23 1918 03/20/23 0616  NA 137 132*  --  132*  K 3.1* 3.5  --  4.0  CL 103 94*  --  93*  CO2 19* 21*  --  25  GLUCOSE 127* 254*  --  342*  BUN 36* 45*  --  31*  CREATININE 6.35* 7.80*  --  4.45*  CALCIUM 8.1* 9.0  --  9.3  PHOS 6.0* 6.9* 3.3 3.6   Liver Function Tests: Recent Labs  Lab 03/18/23 1555 03/19/23 0531 03/20/23 0616  AST 18 18 17   ALT <5 <5 <5  ALKPHOS 64 77 86  BILITOT 0.4 0.5 0.7  PROT 4.7* 5.3* 5.1*  ALBUMIN 1.8* 2.0* 2.0*   CBC: Recent Labs  Lab 03/16/23 0456 03/16/23 1446 03/17/23 0627 03/17/23 1814 03/18/23 0423 03/19/23 0531 03/20/23 0616  WBC 8.4  --  5.4  --  5.0 4.9 6.2  HGB 8.4*   < > 7.5*   < > 7.8* 8.2* 7.3*  HCT 26.0*   < > 23.8*   < > 24.2* 25.3* 22.4*  MCV 95.9  --  96.4  --  96.0 96.2 95.7  PLT 155  --  164  --  158 173 181   < > = values in this interval not displayed.   Blood Culture    Component Value Date/Time   SDES BLOOD LEFT ARM 10/17/2022 0811   SPECREQUEST  10/17/2022 0811    BOTTLES DRAWN AEROBIC AND ANAEROBIC Blood Culture adequate volume   CULT   10/17/2022 0811    NO GROWTH 5 DAYS Performed at [facility], [address], [place], [place] [address]    REPTSTATUS 10/22/2022 FINAL 10/17/2022 2376   Studies/Results: MR BRAIN WO CONTRAST  Result Date: 03/18/2023 CLINICAL DATA:  Stroke follow-up EXAM: MRI HEAD WITHOUT CONTRAST TECHNIQUE: Multiplanar, multiecho pulse sequences of the brain and surrounding structures were obtained without intravenous contrast. COMPARISON:  03/12/2023 FINDINGS: Brain: Expected evolution of diffusion changes at the recent right middle cerebellar peduncle infarct site. No new area acute ischemia. No acute or chronic hemorrhage. There is multifocal hyperintense T2-weighted signal within the white matter. Generalized volume loss. The midline structures are normal. Vascular: Normal flow voids. Skull and upper cervical spine: Normal calvarium and skull base. Visualized upper cervical spine and soft tissues are normal. Sinuses/Orbits:No paranasal sinus fluid levels or advanced mucosal thickening. No mastoid or middle ear effusion. Normal orbits. IMPRESSION: Expected evolution of diffusion changes at the recent right middle cerebellar peduncle infarct site.  No new area of acute ischemia. Electronically Signed   By: Deatra Robinson M.D.   On: 03/18/2023 22:59    Medications:  feeding supplement (OSMOLITE 1.5 CAL) 45 mL/hr at 03/20/23 0559   heparin 750 Units/hr (03/20/23 0559)   [START ON 03/23/2023] levETIRAcetam     And   [START ON 03/23/2023] levETIRAcetam     levETIRAcetam 400 mL/hr at 03/20/23 0559   And   [START ON ] levETIRAcetam     tranexamic acid      atorvastatin  40 mg Per Tube Daily   Chlorhexidine Gluconate Cloth  6 each Topical Q0600   clopidogrel  75 mg Per Tube Daily   doxercalciferol  5 mcg Intravenous Q M,W,F-HD   feeding supplement (PROSource TF20)  60 mL Per Tube Daily   free water  100 mL Per Tube Q8H   insulin aspart  0-9 Units Subcutaneous Q4H   metoprolol tartrate  12.5  mg Per Tube BID   multivitamin  1 tablet Per Tube QHS   mouth rinse  15 mL Mouth Rinse 4 times per day   pantoprazole (PROTONIX) IV  40 mg Intravenous Q24H   sevelamer carbonate  1.6 g Per Tube TID WC   thiamine  100 mg Per Tube Daily    Dialysis Orders: MWF  - [facility] 3.5hrs, BFR 300, DFR AF 1.5,  EDW 54.2kg, 2K/ 2Ca UFP 1, AVF, TDC, no heparin Mircera 30 mcg q4wks - last 10/7 Venofer 100mg  qHD x10 Hectorol IV qHD   Assessment/Plan:  L hip fracture s/p fall at home: S/p ORIF 03/14/23.   Acute R brachium pontine stroke: Per MRI.  Seizures/status epilepticus (resolved): On admit, was on multiple anti-seizure meds, now weaned off to Keppra monotherapy.  ESRD: Usual MWF schedule - off schedule this week. S/p HD yesterday -> next Saturday (10/26), then get back on schedule next week. BFR 300 per family request. BP/volume: On low dose metoprolol only. No edema today. Anemia of ESRD: Hgb 7.3 - overdue for ESA - start today. Secondary Hyperparathyroidism: CorrCa slightly high, Phos good. Hold VDRA, continue sevelamer via tube.    Nutrition: Alb 2, very low and was not eating. NG tube placed - getting TF for now. Per daughter, she is now requesting some PO food - will need repeat swallowing study.  T2DM:  Per primary  CAD  Ozzie Hoyle, PA-C 03/20/2023, 11:33 AM  [facility]

## 2023-03-20 NOTE — TOC Progression Note (Signed)
Transition of Care [facility]) - Progression Note    Patient Details  Name: Robin [name] MRN: [id number] Date of Birth: 07-Aug-1950  Transition of Care [facility]) CM/SW Contact  Delilah Shan, LCSWA Phone Number: 03/20/2023, 4:24 PM  Clinical Narrative:     Patient has SNF bed at [facility] place.CSW following to start insurance authorization closer to patient being medically ready for dc.   Expected Discharge Plan: Skilled Nursing Facility Barriers to Discharge: Continued Medical Work up  Expected Discharge Plan and Services In-house Referral: Clinical Social Work     Living arrangements for the past 2 months: Single Family Home                                       Social Determinants of Health (SDOH) Interventions SDOH Screenings   Food Insecurity: No Food Insecurity (03/19/2023)  Housing: Patient Declined (03/19/2023)  Transportation Needs: No Transportation Needs (03/19/2023)  Utilities: Not At Risk (03/19/2023)  Tobacco Use: High Risk (03/14/2023)    Readmission Risk Interventions     No data to display

## 2023-03-20 NOTE — Progress Notes (Signed)
Subjective: NAEO.   ROS: Unable to obtain due to poor mental status  Examination  Vital signs in last 24 hours: Temp:  [97.7 F (36.5 C)-98.7 F (37.1 C)] 98.2 F (36.8 C) (10/25 0808) Pulse Rate:  [40-102] 78 (10/25 0808) Resp:  [13-28] 16 (10/25 0808) BP: (134-187)/(52-94) 146/63 (10/25 0808) SpO2:  [87 %-100 %] 100 % (10/25 0808) Weight:  [58.5 kg] 58.5 kg (10/25 0417)  General: lying in bed, NAD Neuro: Was drowsy but opens eyes to verbal stimulation, able to tell me her name and that she is at [facility], did not answer other orientation questions, did squeeze my hands but did not follow other commands, pupils appear equally round and reactive, no significant events appear intact, antigravity strength in all 4 extremities  Basic Metabolic Panel: Recent Labs  Lab 03/16/23 0456 03/17/23 0627 03/18/23 1555 03/19/23 0531 03/19/23 1918 03/20/23 0616  NA 136 139 137 132*  --  132*  K 4.0 3.5 3.1* 3.5  --  4.0  CL 94* 98 103 94*  --  93*  CO2 23 24 19* 21*  --  25  GLUCOSE 97 64* 127* 254*  --  342*  BUN 44* 30* 36* 45*  --  31*  CREATININE 7.38* 5.25* 6.35* 7.80*  --  4.45*  CALCIUM 9.5 8.9 8.1* 9.0  --  9.3  MG  --  1.9 1.7 1.9 1.9 2.0  PHOS  --  6.5* 6.0* 6.9* 3.3 3.6    CBC: Recent Labs  Lab 03/16/23 0456 03/16/23 1446 03/17/23 0627 03/17/23 1814 03/18/23 0423 03/19/23 0531 03/20/23 0616  WBC 8.4  --  5.4  --  5.0 4.9 6.2  HGB 8.4*   < > 7.5* 8.9* 7.8* 8.2* 7.3*  HCT 26.0*   < > 23.8* 28.4* 24.2* 25.3* 22.4*  MCV 95.9  --  96.4  --  96.0 96.2 95.7  PLT 155  --  164  --  158 173 181   < > = values in this interval not displayed.     Coagulation Studies: No results for input(s): "LABPROT", "INR" in the last 72 hours.  Imaging No new brain imaging overnight  ASSESSMENT AND PLAN: 72 year old female with history of hypertension, diabetes, hyperlipidemia, coronary artery disease, end-stage renal disease on hemodialysis (Monday Wednesday Friday),  A-fib on Eliquis who had a fall at home and subsequently had left hip fracture.  While in the hospital was also found to have right cerebellar infarct and has seen by stroke team.  However yesterday patient was noted to have right facial twitching and upper extremity tremor-like movements.  EEG was obtained which showed generalized spikes and polyspikes.    Status epilepticus, resolved -Reportedly patient has history of PEA arrest in February 2023.  With the current EEG pattern, it is possible that patient had seizure recurrence due to lowered seizure threshold in the setting of hip fracture and subsequent management   Recommendations -Continue Keppra to 500 mg daily with an additional 250 mg dose after dialysis on Monday Wednesday Friday.  Will switch to p.o. -Continue seizure precautions -Follow-up with neurology in 3 months (order placed) -Updated Dr. Caleb Popp about the plan by secure chat  Seizure precautions: Per [facility] statutes, patients with seizures are not allowed to drive until they have been seizure-free for six months and cleared by a physician    Use caution when using heavy equipment or power tools. Avoid working on ladders or at heights. Take showers instead of  baths. Ensure the water temperature is not too high on the home water heater. Do not go swimming alone. Do not lock yourself in a room alone (i.e. bathroom). When caring for infants or small children, sit down when holding, feeding, or changing them to minimize risk of injury to the child in the event you have a seizure. Maintain good sleep hygiene. Avoid alcohol.    If patient has another seizure, call 911 and bring them back to the ED if: A.  The seizure lasts longer than 5 minutes.      B.  The patient doesn't wake shortly after the seizure or has new problems such as difficulty seeing, speaking or moving following the seizure C.  The patient was injured during the seizure D.  The patient has a temperature over  102 F (39C) E.  The patient vomited during the seizure and now is having trouble breathing    During the Seizure   - First, ensure adequate ventilation and place patients on the floor on their left side  Loosen clothing around the neck and ensure the airway is patent. If the patient is clenching the teeth, do not force the mouth open with any object as this can cause severe damage - Remove all items from the surrounding that can be hazardous. The patient may be oblivious to what's happening and may not even know what he or she is doing. If the patient is confused and wandering, either gently guide him/her away and block access to outside areas - Reassure the individual and be comforting - Call 911. In most cases, the seizure ends before EMS arrives. However, there are cases when seizures may last over 3 to 5 minutes. Or the individual may have developed breathing difficulties or severe injuries. If a pregnant patient or a person with diabetes develops a seizure, it is prudent to call an ambulance.    After the Seizure (Postictal Stage)   After a seizure, most patients experience confusion, fatigue, muscle pain and/or a headache. Thus, one should permit the individual to sleep. For the next few days, reassurance is essential. Being calm and helping reorient the person is also of importance.   Most seizures are painless and end spontaneously. Seizures are not harmful to others but can lead to complications such as stress on the lungs, brain and the heart. Individuals with prior lung problems may develop labored breathing and respiratory distress.    I have spent a total of 36 minutes with the patient reviewing hospital notes,  test results, labs and examining the patient as well as establishing an assessment and plan that was discussed personally with the patient.  > 50% of time was spent in direct patient care.      Lindie Spruce Epilepsy [facility] For questions after 5pm  please refer to AMION to reach the Neurologist on call

## 2023-03-20 NOTE — Progress Notes (Signed)
PHARMACY - ANTICOAGULATION CONSULT NOTE  Pharmacy Consult for heparin Indication: atrial fibrillation (PTA Eliquis)  No Known Allergies  Patient Measurements: Height: 5' 4.02" (162.6 cm) Weight: 58.5 kg (128 lb 15.5 oz) IBW/kg (Calculated) : 54.74 Heparin Dosing Weight: 52.9 kg  Vital Signs: Temp: 98.6 F (37 C) (10/25 1556) Temp Source: Oral (10/25 1556) BP: 146/95 (10/25 2227) Pulse Rate: 81 (10/25 2227)  Labs: Recent Labs    03/18/23 0423 03/18/23 1348 03/18/23 1555 03/19/23 0531 03/20/23 0616 03/20/23 2210  HGB 7.8*  --   --  8.2* 7.3*  --   HCT 24.2*  --   --  25.3* 22.4*  --   PLT 158  --   --  173 181  --   HEPARINUNFRC 0.22*   < >  --  0.49 0.20* 0.14*  CREATININE  --   --  6.35* 7.80* 4.45*  --    < > = values in this interval not displayed.    Estimated Creatinine Clearance: 10.2 mL/min (A) (by C-G formula based on SCr of 4.45 mg/dL (H)).   Medical History: Past Medical History:  Diagnosis Date   Acute delirium 11/18/2014   Acute encephalopathy    Acute on chronic respiratory failure with hypoxia (HCC) 09/09/2016   Acute respiratory failure with hypoxia (HCC) 11/29/2015   Anemia in chronic kidney disease 12/09/2014   Anxiety    Arthritis    Benign hypertension    Bipolar affective disorder (HCC) 12/09/2014   CAD (coronary artery disease), native coronary artery with 2 stents  11/17/2014   Charcot foot due to diabetes mellitus (HCC)    Chronic combined systolic and diastolic CHF (congestive heart failure) (HCC) 11/18/2014   Closed left ankle fracture 11/17/2014   Confusion 01/21/2015   Depression    Diabetes mellitus without complication (HCC)    End stage renal disease on dialysis (HCC)    Fracture dislocation of ankle 11/17/2014   GERD (gastroesophageal reflux disease)    Heart murmur    History of blood transfusion    History of bronchitis    History of pneumonia    Hyperlipidemia 03/26/2015   Hypertension associated with diabetes (HCC) 11/18/2014    Hypertensive heart/renal disease with failure (HCC) 12/09/2014   Hypokalemia 11/17/2014   Multiple falls 01/21/2015   Nausea & vomiting 01/15/2023   Obesity 11/17/2014   Onychomycosis 10/07/2016   Right leg DVT (HCC) 12/05/2015   SIRS (systemic inflammatory response syndrome) (HCC) 08/08/2016   Sleep apnea    Unstable angina (HCC) 12/26/2017   Ventricular tachycardia [facility])     Assessment: 72 yo female with PMH of DVT in 2017 and Afib on Eliquis (last dose PTA, 10/17 AM) who presented 10/17 with stroke confirmed on CT head / MRI and now s/p L hip replacement 10/19.  Head CT 10/20 with no acute hemorrhage.  Pharmacy consulted for heparin dosing until able to tolerate PO.  Heparin level is subtherapeutic at 0.14, on 800 units/hr. Hgb downtrending to 7.3, plt 181, per RN no s/sx of bleeding. No issues with then heparin infusion running continuously or pauses.  Goal of Therapy:  Heparin level 0.3-0.5 units/ml, no bolus Monitor platelets by anticoagulation protocol: Yes   Plan:  Increase heparin infusion to 900 units/hr Order 8 hr heparin level  Daily heparin level and CBC, s/sx of bleeding   Thank you for allowing pharmacy to participate in this patient's care,  Arabella Merles, PharmD. Clinical Pharmacist 03/20/2023 11:03 PM

## 2023-03-20 NOTE — Progress Notes (Signed)
PHARMACY - ANTICOAGULATION CONSULT NOTE  Pharmacy Consult for heparin Indication: atrial fibrillation (PTA Eliquis)  No Known Allergies  Patient Measurements: Height: 5' 4.02" (162.6 cm) Weight: 58.5 kg (128 lb 15.5 oz) IBW/kg (Calculated) : 54.74 Heparin Dosing Weight: 52.9 kg  Vital Signs: Temp: 97.6 F (36.4 C) (10/25 1254) Temp Source: Axillary (10/25 1254) BP: 141/56 (10/25 1254) Pulse Rate: 73 (10/25 1254)  Labs: Recent Labs    03/18/23 0423 03/18/23 1348 03/18/23 1555 03/19/23 0531 03/20/23 0616  HGB 7.8*  --   --  8.2* 7.3*  HCT 24.2*  --   --  25.3* 22.4*  PLT 158  --   --  173 181  HEPARINUNFRC 0.22* 0.41  --  0.49 0.20*  CREATININE  --   --  6.35* 7.80* 4.45*    Estimated Creatinine Clearance: 10.2 mL/min (A) (by C-G formula based on SCr of 4.45 mg/dL (H)).   Medical History: Past Medical History:  Diagnosis Date   Acute delirium 11/18/2014   Acute encephalopathy    Acute on chronic respiratory failure with hypoxia (HCC) 09/09/2016   Acute respiratory failure with hypoxia (HCC) 11/29/2015   Anemia in chronic kidney disease 12/09/2014   Anxiety    Arthritis    Benign hypertension    Bipolar affective disorder (HCC) 12/09/2014   CAD (coronary artery disease), native coronary artery with 2 stents  11/17/2014   Charcot foot due to diabetes mellitus (HCC)    Chronic combined systolic and diastolic CHF (congestive heart failure) (HCC) 11/18/2014   Closed left ankle fracture 11/17/2014   Confusion 01/21/2015   Depression    Diabetes mellitus without complication (HCC)    End stage renal disease on dialysis (HCC)    Fracture dislocation of ankle 11/17/2014   GERD (gastroesophageal reflux disease)    Heart murmur    History of blood transfusion    History of bronchitis    History of pneumonia    Hyperlipidemia 03/26/2015   Hypertension associated with diabetes (HCC) 11/18/2014   Hypertensive heart/renal disease with failure (HCC) 12/09/2014   Hypokalemia  11/17/2014   Multiple falls 01/21/2015   Nausea & vomiting 01/15/2023   Obesity 11/17/2014   Onychomycosis 10/07/2016   Right leg DVT (HCC) 12/05/2015   SIRS (systemic inflammatory response syndrome) (HCC) 08/08/2016   Sleep apnea    Unstable angina (HCC) 12/26/2017   Ventricular tachycardia [facility])     Assessment: 72 yo [name] with PMH of DVT in 2017 and Afib on Eliquis (last dose PTA, 10/17 AM) who presented 10/17 with stroke confirmed on CT head / MRI and now s/p L hip replacement 10/19.  Head CT 10/20 with no acute hemorrhage.  Pharmacy consulted for heparin dosing until able to tolerate PO.  Heparin level is subtherapeutic at 0.2, on 750 units/hr. Hgb downtrending to 7.3, plt 181. No s/sx of bleeding or infusion issues.   Goal of Therapy:  Heparin level 0.3-0.5 units/ml, no bolus Monitor platelets by anticoagulation protocol: Yes   Plan:  Increase heparin infusion to 800 units/hr Order 8 hr heparin level  Daily heparin level and CBC, s/sx of bleeding   Thank you for allowing pharmacy to participate in this patient's care,  Sherron Monday, PharmD, BCCCP Clinical Pharmacist  Phone: [phone] 03/20/2023 1:24 PM  Please check AMION for all [facility] Pharmacy phone numbers After 10:00 PM, call Main Pharmacy [phone]

## 2023-03-20 NOTE — Progress Notes (Signed)
PROGRESS NOTE    Robin [name]  VHQ:[id number] DOB: 07/10/1950 DOA: 02/28/2023 PCP: [facility]   Brief Narrative: Robin [name] is a 72 y.o. female with a history of ESRD on hemodialysis, CAD status post PCI with stent placement, heart failure with reduced EF, diabetes mellitus type 2, hypertension.  Patient presented secondary to fall with subsequent left hip pain and was found to have evidence of a left hip fracture.  Patient underwent left intertrochanteric IM rodding on 10/19, however on 10/20, patient was found to have intermittent facial tremors with bilateral upper extremity tremors with confirmation of status epilepticus on EEG.  Neurology was consulted.  Status epilepticus resolved after phenobarbital administration.  During workup, patient was also found to have evidence of an acute versus early subacute infarct involving the right brachium pontis.  NG tube placed on 10/23 for nutrition.   Assessment and Plan:  Status epilepticus New seizure diagnosis. Episode occurred after surgery. Associated CVA. Neurology consulted. Patient noted to have evidence of status epilepticus on EEG. Status epilepticus resolved after phenobarbital. MRI brain repeated and is stable. -Neurology recommendations: phenobarbital taper, discontinue Vimpat, titrating down Keppra  Acute vs early subacute infarct MRI brain (10/17) confirms acute vs early subacute infarct involving the right brachium pontis. MRA head without proximal large vessel occlusion or high-grade stenosis. LDL of 105. Hemoglobin A1C of 7.7%. Transthoracic Echocardiogram with bubble study obtained on 10/20 and was significant for an LVEF of 40-45% with no thrombus or atrial level shunt. Neurology recommendations for continued anticoagulation with Eliquis. Recommendation for Neurology follow-up in 4 weeks.  Dysphagia Secondary to acute stroke seizures with resultant encephalopathy.  NG Cortrak placed on 10/23.  Discussed  long-term nutrition goals with daughter at bedside.  Daughter would be okay with PEG tube for feeding if patient's quality of life would benefit however she would decline PEG if good quality of life was not achievable. Mental status improved and speech therapy has now recommended a dysphagia 3 diet. -Continue Cortrak and tube feeds; discontinue if adequate oral intake -Dysphagia 3 diet  Paroxysmal atrial fibrillation with RVR -Continue metoprolol and heparin IV  Closed left hip fracture Secondary to fall.  Orthopedic surgery was consulted on admission and performed left intertrochanteric fracture IM rodding on 10/19.  Therapy held secondary to encephalopathy and seizures.  Acute anemia Unclear etiology. Complicated by anticoagulation. Anemia panel suggests chronic illness picture. No obvious evidence of hemorrhaging/GI bleeding. Hemoglobin back down. -FOBT -Continue Heparin IV  Diabetes mellitus type 2 Well-controlled with hemoglobin A1c of 7.7%.  Patient has been having hypoglycemia this admission secondary to poor oral intake in addition to insulin administration.  Insulin held.  D10 fluids initially started to help with ischemia.  Patient is now on tube feeds via NG Cortrak tube.  CAD -Continue heparin and Plavix  History of PE -Continue Heparin IV with eventual transition to DOAC  ESRD on HD Nephrology consulted.  Patient is currently on a Monday, Wednesday, Friday schedule for hemodialysis.  Bipolar disorder Patient is on Abilify as an outpatient which was held secondary to inability to take p.o.  Primary hypertension Uncontrolled, possibly secondary to missed HD yesterday. Has had hypotension yesterday -Continue metoprolol   Chronic heart failure with reduced EF Last echocardiogram with an LVEF of 45 to 50% with associated grade 2 diastolic dysfunction.  Patient is euvolemic.  Severe protein calorie malnutrition As diagnosed by dietitian.  Patient is currently on tube  feeds.  Cortrak secondary to dysphagia.  Goals  of care Palliative care has been consulted and is following.    DVT prophylaxis: Heparin drip Code Status:   Code Status: Limited: Do not attempt resuscitation (DNR) -DNR-LIMITED -Do Not Intubate/DNI  Family Communication: None at bedside Disposition Plan: Discharge is pending improvement of encephalopathy   Consultants:  Orthopedic surgery Nephrology Cardiology Palliative care medicine  Procedures:  10/20 > 10/23 LTM EEG  Antimicrobials: None    Subjective: Patient reports some buttock pain. No other concerns.  Objective: BP (!) 141/56 (BP Location: Right Arm)   Pulse 73   Temp 97.6 F (36.4 C) (Axillary)   Resp 12   Ht 5' 4.02" (1.626 m)   Wt 58.5 kg   SpO2 100%   BMI 22.13 kg/m   Examination:  General exam: Appears calm and comfortable Respiratory system: Clear to auscultation. Respiratory effort normal. Cardiovascular system: S1 & S2 heard, RRR. Gastrointestinal system: Abdomen is nondistended, soft and nontender. No organomegaly or masses felt. Normal bowel sounds heard. Central nervous system: Alert and oriented to person and place.   Data Reviewed: I have personally reviewed following labs and imaging studies  CBC Lab Results  Component Value Date   WBC 6.2 03/20/2023   RBC 2.34 (L) 03/20/2023   HGB 7.3 (L) 03/20/2023   HCT 22.4 (L) 03/20/2023   MCV 95.7 03/20/2023   MCH 31.2 03/20/2023   PLT 181 03/20/2023   MCHC 32.6 03/20/2023   RDW 17.1 (H) 03/20/2023   LYMPHSABS 0.8 03/08/2023   MONOABS 0.7 03/03/2023   EOSABS 0.0 03/02/2023   BASOSABS 0.0 03/07/2023     Last metabolic panel Lab Results  Component Value Date   NA 132 (L) 03/20/2023   K 4.0 03/20/2023   CL 93 (L) 03/20/2023   CO2 25 03/20/2023   BUN 31 (H) 03/20/2023   CREATININE 4.45 (H) 03/20/2023   GLUCOSE 342 (H) 03/20/2023   GFRNONAA 10 (L) 03/20/2023   GFRAA 5 (L) 01/31/2020   CALCIUM 9.3 03/20/2023   PHOS 3.6 03/20/2023    PROT 5.1 (L) 03/20/2023   ALBUMIN 2.0 (L) 03/20/2023   BILITOT 0.7 03/20/2023   ALKPHOS 86 03/20/2023   AST 17 03/20/2023   ALT <5 03/20/2023   ANIONGAP 14 03/20/2023    GFR: Estimated Creatinine Clearance: 10.2 mL/min (A) (by C-G formula based on SCr of 4.45 mg/dL (H)).  Recent Results (from the past 240 hour(s))  MRSA Next Gen by PCR, Nasal     Status: None   Collection Time: 03/20/23  5:45 AM   Specimen: Nasal Mucosa; Nasal Swab  Result Value Ref Range Status   MRSA by PCR Next Gen NOT DETECTED NOT DETECTED Final    Comment: (NOTE) The GeneXpert MRSA Assay (FDA approved for NASAL specimens only), is one component of a comprehensive MRSA colonization surveillance program. It is not intended to diagnose MRSA infection nor to guide or monitor treatment for MRSA infections. Test performance is not FDA approved in patients less than 66 years old. Performed at [facility], [address] [place], [place] [address]       Radiology Studies: MR BRAIN WO CONTRAST  Result Date: 03/18/2023 CLINICAL DATA:  Stroke follow-up EXAM: MRI HEAD WITHOUT CONTRAST TECHNIQUE: Multiplanar, multiecho pulse sequences of the brain and surrounding structures were obtained without intravenous contrast. COMPARISON:  03/10/2023 FINDINGS: Brain: Expected evolution of diffusion changes at the recent right middle cerebellar peduncle infarct site. No new area acute ischemia. No acute or chronic hemorrhage. There is multifocal hyperintense T2-weighted  signal within the white matter. Generalized volume loss. The midline structures are normal. Vascular: Normal flow voids. Skull and upper cervical spine: Normal calvarium and skull base. Visualized upper cervical spine and soft tissues are normal. Sinuses/Orbits:No paranasal sinus fluid levels or advanced mucosal thickening. No mastoid or middle ear effusion. Normal orbits. IMPRESSION: Expected evolution of diffusion changes at the recent right middle  cerebellar peduncle infarct site. No new area of acute ischemia. Electronically Signed   By: Deatra Robinson M.D.   On: 03/18/2023 22:59      LOS: 8 days    Jacquelin Hawking, MD [facility] 03/20/2023, 2:01 PM   If 7PM-7AM, please contact night-coverage [email or web address]

## 2023-03-21 DIAGNOSIS — S72142A Displaced intertrochanteric fracture of left femur, initial encounter for closed fracture: Secondary | ICD-10-CM | POA: Diagnosis not present

## 2023-03-21 LAB — CBC
HCT: 26.8 % — ABNORMAL LOW (ref 36.0–46.0)
Hemoglobin: 8.8 g/dL — ABNORMAL LOW (ref 12.0–15.0)
MCH: 31.7 pg (ref 26.0–34.0)
MCHC: 32.8 g/dL (ref 30.0–36.0)
MCV: 96.4 fL (ref 80.0–100.0)
Platelets: 220 10*3/uL (ref 150–400)
RBC: 2.78 MIL/uL — ABNORMAL LOW (ref 3.87–5.11)
RDW: 17 % — ABNORMAL HIGH (ref 11.5–15.5)
WBC: 10.3 10*3/uL (ref 4.0–10.5)
nRBC: 0 % (ref 0.0–0.2)

## 2023-03-21 LAB — COMPREHENSIVE METABOLIC PANEL
ALT: 6 U/L (ref 0–44)
AST: 20 U/L (ref 15–41)
Albumin: 2.4 g/dL — ABNORMAL LOW (ref 3.5–5.0)
Alkaline Phosphatase: 108 U/L (ref 38–126)
Anion gap: 15 (ref 5–15)
BUN: 48 mg/dL — ABNORMAL HIGH (ref 8–23)
CO2: 26 mmol/L (ref 22–32)
Calcium: 10.8 mg/dL — ABNORMAL HIGH (ref 8.9–10.3)
Chloride: 89 mmol/L — ABNORMAL LOW (ref 98–111)
Creatinine, Ser: 5.41 mg/dL — ABNORMAL HIGH (ref 0.44–1.00)
GFR, Estimated: 8 mL/min — ABNORMAL LOW (ref >60)
Glucose, Bld: 226 mg/dL — ABNORMAL HIGH (ref 70–99)
Potassium: 4.8 mmol/L (ref 3.5–5.1)
Sodium: 130 mmol/L — ABNORMAL LOW (ref 135–145)
Total Bilirubin: 0.8 mg/dL (ref 0.3–1.2)
Total Protein: 6 g/dL — ABNORMAL LOW (ref 6.5–8.1)

## 2023-03-21 LAB — GLUCOSE, CAPILLARY
Glucose-Capillary: 209 mg/dL — ABNORMAL HIGH (ref 70–99)
Glucose-Capillary: 245 mg/dL — ABNORMAL HIGH (ref 70–99)
Glucose-Capillary: 249 mg/dL — ABNORMAL HIGH (ref 70–99)

## 2023-03-21 LAB — HEPARIN LEVEL (UNFRACTIONATED): Heparin Unfractionated: 0.84 [IU]/mL — ABNORMAL HIGH (ref 0.30–0.70)

## 2023-03-21 MED ORDER — LEVETIRACETAM 500 MG PO TABS
500.0000 mg | ORAL_TABLET | Freq: Every day | ORAL | Status: DC
  Administered 2023-03-21: 500 mg
  Filled 2023-03-21: qty 1

## 2023-03-21 MED ORDER — ALTEPLASE 2 MG IJ SOLR: 2.0000 mg | Freq: Once | INTRAMUSCULAR | Status: DC | PRN

## 2023-03-21 MED ORDER — LIDOCAINE-PRILOCAINE 2.5-2.5 % EX CREA
1.0000 | TOPICAL_CREAM | CUTANEOUS | Status: DC | PRN
Start: 2023-03-21 — End: 2023-03-21

## 2023-03-21 MED ORDER — ANTICOAGULANT SODIUM CITRATE 4% (200MG/5ML) IV SOLN: 5.0000 mL | Status: DC | PRN

## 2023-03-21 MED ORDER — HEPARIN SODIUM (PORCINE) 1000 UNIT/ML IJ SOLN
3800.0000 [IU] | Freq: Once | INTRAMUSCULAR | Status: DC
  Filled 2023-03-21: qty 4

## 2023-03-21 MED ORDER — PENTAFLUOROPROP-TETRAFLUOROETH EX AERO: 1.0000 | INHALATION_SPRAY | CUTANEOUS | Status: DC | PRN

## 2023-03-21 MED ORDER — LEVETIRACETAM 250 MG PO TABS: 250.0000 mg | ORAL_TABLET | ORAL | Status: DC

## 2023-03-21 MED ORDER — LIDOCAINE HCL (PF) 1 % IJ SOLN: 5.0000 mL | INTRAMUSCULAR | Status: DC | PRN

## 2023-03-21 MED ORDER — LEVETIRACETAM 250 MG PO TABS
250.0000 mg | ORAL_TABLET | Freq: Once | ORAL | Status: DC
  Filled 2023-03-21: qty 1

## 2023-03-21 MED ORDER — HEPARIN SODIUM (PORCINE) 1000 UNIT/ML DIALYSIS
1000.0000 [IU] | INTRAMUSCULAR | Status: DC | PRN
Start: 2023-03-21 — End: 2023-03-21

## 2023-03-27 NOTE — Progress Notes (Signed)
At around 12PM patient's vitals monitor was alerting.  I went to [facility] and the pulse was in the 200-300's and analyzing V-tach.  I went to shake patient for responsiveness and she would not wake to shaking and her eyes were looking upwards.  I called Charge Nurse Shirline Frees, RN who was the nurse for the patient over, and PA Park Liter also came over.  After a minute observation, he asked me to call a rapid response.  I was at the [employer] Nurse desk about to call, but Mathis Fare RN told us to call a code Blue.  Code Blue team along with staff from [facility] [place] came over within 3 minutes.  Scotty Pinder-LPN in [place]

## 2023-03-27 NOTE — Progress Notes (Signed)
[facility] Progress Note   Subjective:  Seen in room - more awake today. Denies CP or dyspnea.  NG tube in place. For HD today.   Objective Vitals:    0350  0751  0917  0930  BP: (!) 173/83 (!) 146/65 (!) 172/86 (!) 167/71  Pulse: 77 76 75   Resp: 15 14 17    Temp: 98.3 F (36.8 C) 98.2 F (36.8 C) 98.8 F (37.1 C)   TempSrc: Oral Oral Oral   SpO2:  98% 100%   Weight:      Height:       Physical Exam General: Chronically ill appearing woman, NAD, room air. NG tube in place. Heart: RRR; no murmur Lungs: CTA anteriorly Abdomen: soft Extremities: no LE edema Dialysis Access:  [facility]  Additional Objective Labs: Basic Metabolic Panel: Recent Labs  Lab 03/19/23 0531 03/19/23 1918 03/20/23 0616  0356  NA 132*  --  132* 130*  K 3.5  --  4.0 4.8  CL 94*  --  93* 89*  CO2 21*  --  25 26  GLUCOSE 254*  --  342* 226*  BUN 45*  --  31* 48*  CREATININE 7.80*  --  4.45* 5.41*  CALCIUM 9.0  --  9.3 10.8*  PHOS 6.9* 3.3 3.6  --    Liver Function Tests: Recent Labs  Lab 03/19/23 0531 03/20/23 0616  0356  AST 18 17 20   ALT <5 <5 6  ALKPHOS 77 86 108  BILITOT 0.5 0.7 0.8  PROT 5.3* 5.1* 6.0*  ALBUMIN 2.0* 2.0* 2.4*   CBC: Recent Labs  Lab 03/17/23 0627 03/17/23 1814 03/18/23 0423 03/19/23 0531 03/20/23 0616  0356  WBC 5.4  --  5.0 4.9 6.2 10.3  HGB 7.5*   < > 7.8* 8.2* 7.3* 8.8*  HCT 23.8*   < > 24.2* 25.3* 22.4* 26.8*  MCV 96.4  --  96.0 96.2 95.7 96.4  PLT 164  --  158 173 181 220   < > = values in this interval not displayed.   Iron Studies:  Recent Labs    03/19/23 1540  IRON 75  TIBC 133*   Medications:  anticoagulant sodium citrate     feeding supplement (OSMOLITE 1.5 CAL) 45 mL/hr at 03/20/23 2304   heparin 900 Units/hr ( 0755)   tranexamic acid      atorvastatin  40 mg Per Tube Daily   Chlorhexidine Gluconate Cloth  6 each Topical Q0600   clopidogrel  75 mg Per  Tube Daily   darbepoetin (ARANESP) injection - DIALYSIS  100 mcg Subcutaneous Q Fri-1800   feeding supplement (PROSource TF20)  60 mL Per Tube Daily   free water  100 mL Per Tube Q8H   heparin sodium (porcine)  3,800 Units Intracatheter Once   insulin aspart  0-9 Units Subcutaneous Q4H   levETIRAcetam  500 mg Per Tube Q1200   And   [START ON 03/23/2023] levETIRAcetam  250 mg Per Tube Q M,W,F   levETIRAcetam  250 mg Per Tube Once   metoprolol tartrate  12.5 mg Per Tube BID   multivitamin  1 tablet Per Tube QHS   mouth rinse  15 mL Mouth Rinse 4 times per day   pantoprazole (PROTONIX) IV  40 mg Intravenous Q24H   sevelamer carbonate  1.6 g Per Tube TID WC   thiamine  100 mg Per Tube Daily    Dialysis Orders: MWF  - [facility] 3.5hrs, BFR 300, DFR AF  1.5,  EDW 54.2kg, 2K/ 2Ca UFP 1, AVF, TDC, no heparin Mircera 30 mcg q4wks - last 10/7 Venofer 100mg  qHD x10 Hectorol IV qHD    Assessment/Plan:  L hip fracture s/p fall at home: S/p ORIF 03/14/23.    Acute R brachium pontine stroke: Per MRI.  Seizures/status epilepticus (resolved): On admit, was on multiple anti-seizure meds, now weaned off to Keppra monotherapy. Less sedated today.  ESRD: Usual MWF schedule - off schedule this week. For HD today (10/26), then get back on schedule next week. BFR 300 per family request. BP/volume: On low dose metoprolol only. No edema, but Na trending down. Anemia of ESRD: Hgb 8.8 - continue Aranesp q Friday while here.  Secondary Hyperparathyroidism: CorrCa much higher, Phos good. Hold VDRA, continue sevelamer via tube.    Nutrition: Alb 2, very low and was not eating. NG tube placed - getting TF for now. Per daughter, she is now requesting some PO food - will need repeat swallowing study.  T2DM:  Per primary  CAD  Ozzie Hoyle, PA-C , 10:28 AM  [employer]

## 2023-03-27 NOTE — Progress Notes (Addendum)
Around this time,I was on bay #8 ,disconnecting patient from HD machine,heard tele monitor from bay #9 beeping,Eric  checked patient and alerted all of [place] ,i stopped the disconnecting process and proceed to the bay #9. Patient was deep grasping for breathing, she was not responsive and pale looking to her face,still has a BP but heart rate was raging fro 180-200 's ,rhythm was saw tooth appearance/v, fib.P.A started CPR ,told her was limited code -medicines only,still Code BLUE was activated,Rescue breathing was given by face mask,but her tonque protruded out.By the time code team arrived 2-3 minutes ,she was pulseless-by doppler at the femoral artery.Declared dead at 1212 p.m.

## 2023-03-27 NOTE — Progress Notes (Signed)
Late Note Entry- March 23, 2023  Contacted [facility] this morning to advise clinic of pt's passing.   Olivia Canter Renal Navigator [phone]

## 2023-03-27 NOTE — Progress Notes (Signed)
Received patient in bed.Alert to self.Consent verified.  Access used : Right Hd catheters that worked well.Dressing on d

## 2023-03-27 NOTE — Progress Notes (Signed)
PT Cancellation Note  Patient Details Name: Robin [name] MRN: [id number] DOB: 11/03/1950   Cancelled Treatment:    Reason Eval/Treat Not Completed: (P) Patient at procedure or test/unavailable. Pt at HD. Will follow up as schedule and pt status allows which may be another day.   Jamesetta Geralds, PT, DPT [facility] Department Office: [phone]   Jamesetta Geralds , 11:28 AM

## 2023-03-27 NOTE — Progress Notes (Signed)
    1300  Spiritual Encounters  Type of Visit Initial  Care provided to: Pt and family  Conversation partners present during encounter Nurse  Referral source Family;Clinical staff  Reason for visit Patient death  OnCall Visit No  Spiritual Framework  Presenting Themes Meaning/purpose/sources of inspiration;Values and beliefs;Significant life change;Rituals and practive  Patient Stress Factors None identified  Family Stress Factors Exhausted;Family relationships;Loss;Loss of control;Major life changes  Interventions  Spiritual Care Interventions Made Established relationship of care and support;Compassionate presence;Reflective listening;Narrative/life review;Explored values/beliefs/practices/strengths;Bereavement/grief support   Chaplain met  with patient who passed and was surrounded by her [place] granchildren ages 61 and 34 and adult children 4 family members.  Provided support and grief as they expressed their shock and sorrow.

## 2023-03-27 NOTE — Significant Event (Addendum)
Rapid Response Event Note   Reason for Call :  Code Blue Activation  Initial Focused Assessment:  Patient is pulseless and agonally breathing.  Rescue breathing via ambu in process.  Torsades to VF on telemetry.  Code Team at bedside. Confirmed DNR status MD assessed patient's heart tones - absent  Time of death 1211/04/20  Daughter at bedside shortly after.  Resident spoke with daughter. Dr Caleb Popp notified of event by staff   Interventions:    Plan of Care:     Event Summary:   MD Notified:  Call Time: Arrival Time: End Time:  Marcellina Millin, RN

## 2023-03-27 NOTE — Progress Notes (Signed)
Anesthesia Code response: Responded to code blue with CRNAs Aly Katrinka Blazing and Orlin Hilding. We were informed the patient was DNR/DNI. I confirmed this in the patient's chart that she was indeed DNR/DNI. No invasive resuscitation was being performed (no CPR). Doppler being used to assess for pulsatility.

## 2023-03-27 NOTE — Code Documentation (Signed)
[name]   MRN: [id number]   Date of Birth/ Sex: 1950-09-27 , female      Admission Date: 03/13/2023  Attending Provider: No att. providers found  Primary Diagnosis: Closed fracture of left hip (HCC) [S72.002A] Closed fracture of left hip, initial encounter (HCC) [S72.002A]   Indication: Patient with history of ESRD was admitted for left hip fracture.  Became unresponsive during hemodialysis.  Was noted to be pulseless and apneic when code was called.  On arrival, patient remains pulseless and apneic with fine V-fib on her cardiac monitor.  She is DNR.  CPR was not started.  Reviewed telemetry, note that she suddenly went into a polymorphic V. tach which rapidly degenerated into what looked like V-fib.   Technical Description:  - CPR performance duration:   N/a  - Was defibrillation or cardioversion used? No   - Was external pacer placed? No  - Was patient intubated pre/post CPR? No   Medications Administered: Y = Yes; Blank = No Amiodarone    Atropine    Calcium    Epinephrine    Lidocaine    Magnesium    Norepinephrine    Phenylephrine    Sodium bicarbonate    Vasopressin    Other    Post CPR evaluation:  - Final Status - Was patient successfully resuscitated ? No   Miscellaneous Information:  - Time of death:  1211-04-23 PM  - Primary team notified?  Yes  - Family Notified? Rutherford Guys, MD   03/09/2023, 8:10 PM

## 2023-03-27 NOTE — Death Summary Note (Signed)
to have tremor-like activity in bilateral upper extremity at times but it was difficult to see if this was time locked with the polyspikes.  Patient was loaded with valproic acid and subsequently with lacosamide without significant improvement in EEG.  Eventually patient was loaded with levetiracetam on 03/15/2023 at around midnight.  However, EEG continued to show continuous generalized 3 to 5 Hz theta-delta slowing admixed with generalized spike and wave  as well as polyspikes which appeared near continuous. Hyperventilation and photic stimulation were not performed.    ABNORMALITY - Status epilepticus, generalized - Continuous slow, generalized  IMPRESSION: This study showed evidence of status epilepticus with generalized onset.  Additionally there is moderate to severe diffuse encephalopathy.  Robin [name]  CT HEAD WO CONTRAST ( )  Result Date: 03/15/2023 CLINICAL DATA:  Stroke, follow-up EXAM: CT HEAD WITHOUT CONTRAST TECHNIQUE: Contiguous axial images were obtained from the base of the skull through the vertex without intravenous contrast. RADIATION DOSE REDUCTION: This exam was performed according to the departmental dose-optimization program which includes automated exposure control, adjustment of the mA and/or kV according to patient size and/or use of iterative reconstruction technique. COMPARISON:  03/12/2023 CT head and MRI head FINDINGS: Brain: Redemonstrated hypodensity in right brachium pontis, which correlates with the acute infarct noted on the prior MRI, with new hypodensity in the right greater than left aspect of the pons (series 3, image 10), which may be artifactual. No evidence of acute hemorrhage, mass, mass effect, or midline shift. No hydrocephalus or extra-axial fluid collection. Vascular: No hyperdense vessel. Redemonstrated bilateral pipeline stents. Atherosclerotic calcifications in the vertebral arteries. Skull: Negative for fracture or focal lesion. Sinuses/Orbits: No acute finding. Other: The mastoid air cells are well aerated. IMPRESSION: 1. New hypodensity in the right greater than left aspect of the pons, which may be artifactual. Consider MRI for further evaluation. 2. 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 No acute intracranial hemorrhage. These results will be called to the ordering clinician or representative by the Radiologist Assistant, and communication documented in the PACS or [employer].  Electronically Signed   By: Wiliam Ke M.D.   On: 03/15/2023 19:51   ECHOCARDIOGRAM COMPLETE BUBBLE STUDY  Result Date: 03/15/2023    ECHOCARDIOGRAM REPORT   Patient Name:   Robin [name] Date of Exam: 03/15/2023 Medical Rec #:  [id number]    Height:       64.0 in Accession #:    [id number]   Weight:       116.6 lb Date of Birth:  [birth year]    BSA:          1.556 m Patient Age:    72 years     BP:           206/187 mmHg Patient Gender: F            HR:           96 bpm. Exam Location:  Inpatient Procedure: 2D Echo, Cardiac Doppler and Color Doppler Indications:    stroke  History:        Patient has prior history of Echocardiogram examinations, most                 recent 07/29/2022. CHF, end stage renal disease, Arrythmias:PAC;                 Risk Factors:Diabetes, Dyslipidemia and Current Smoker.  Sonographer:    Delcie Roch RDCS Referring Phys: [id number] MICHAEL A MOORE IMPRESSIONS  1.  DEATH SUMMARY   Patient Details  Name: Robin [name] MRN: [id number] DOB: April 19, 1951 ONG:EXBMWU, Ria Clock Medical Admission/Discharge Information   Admit Date:  03/24/2023  Date of Death: Date of Death: 04/02/2023  Time of Death: Time of Death: 1211-04-11  Length of Stay: 9   Principle Cause of death: Ventricular fibrillation  Hospital Diagnoses: Principal Problem:   Displaced intertrochanteric fracture of left femur, initial encounter for closed fracture [facility]) Active Problems:   Cerebrovascular accident (CVA) due to thrombosis of right cerebellar artery (HCC)   Protein-calorie malnutrition, severe   Hospital Course: Robin [name] is a 72 y.o. female with a history of ESRD on hemodialysis, CAD status post PCI with stent placement, heart failure with reduced EF, diabetes mellitus type 2, hypertension.  Patient presented secondary to fall with subsequent left hip pain and was found to have evidence of a left hip fracture.  Patient underwent left intertrochanteric IM rodding on 10/19, however on 10/20, patient was found to have intermittent facial tremors with bilateral upper extremity tremors with confirmation of status epilepticus on EEG.  Neurology was consulted.  Status epilepticus resolved after phenobarbital administration.  During workup, patient was also found to have evidence of an acute versus early subacute infarct involving the right brachium pontis.  NG tube placed on 10/23 for nutrition. AED were weaned with improvement of mental status. Diet advanced and patient started mobility with physical therapy. During hemodialysis session on 2023-04-02, patient was found to be pulseless with agonal breathing; ventricular fibrillation noted on monitoring. Patient received rescue breaths with bag valve mask but CPR not initiated secondary to code status.  Assessment and Plan:  Status epilepticus New seizure diagnosis. Episode occurred after surgery. Associated CVA. Neurology consulted. Patient  noted to have evidence of status epilepticus on EEG. Status epilepticus resolved after phenobarbital. MRI brain repeated and is stable. -Neurology recommendations: phenobarbital taper, discontinue Vimpat, titrating down Keppra   Acute vs early subacute infarct MRI brain 24-Mar-2023) confirms acute vs early subacute infarct involving the right brachium pontis. MRA head without proximal large vessel occlusion or high-grade stenosis. LDL of 105. Hemoglobin A1C of 7.7%. Transthoracic Echocardiogram with bubble study obtained on 10/20 and was significant for an LVEF of 40-45% with no thrombus or atrial level shunt. Neurology recommendations for continued anticoagulation with Eliquis. Recommendation for Neurology follow-up in 4 weeks.   Dysphagia Secondary to acute stroke seizures with resultant encephalopathy.  NG Cortrak placed on 10/23.  Discussed long-term nutrition goals with daughter at bedside.  Daughter would be okay with PEG tube for feeding if patient's quality of life would benefit however she would decline PEG if good quality of life was not achievable. Mental status improved and speech therapy has now recommended a dysphagia 3 diet. -Continue Cortrak and tube feeds; discontinue if adequate oral intake -Dysphagia 3 diet   Paroxysmal atrial fibrillation with RVR -Continue metoprolol and heparin IV   Closed left hip fracture Secondary to fall.  Orthopedic surgery was consulted on admission and performed left intertrochanteric fracture IM rodding on 10/19.  Therapy held secondary to encephalopathy and seizures.   Acute anemia Unclear etiology. Complicated by anticoagulation. Anemia panel suggests chronic illness picture. No obvious evidence of hemorrhaging/GI bleeding. Hemoglobin back down. -FOBT -Continue Heparin IV   Diabetes mellitus type 2 Well-controlled with hemoglobin A1c of 7.7%.  Patient has been having hypoglycemia this admission secondary to poor oral intake in addition to insulin  administration.  Insulin held.  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 female with a history of ESRD on hemodialysis, CAD status post PCI with stent placement, heart failure with reduced EF, diabetes mellitus type 2, hypertension.  Patient presented secondary to fall with subsequent left hip pain and was found to have evidence of a left hip fracture.  Patient underwent left intertrochanteric IM rodding on 10/19, however on 10/20, patient was found to have intermittent facial tremors with bilateral upper extremity tremors with confirmation of status epilepticus on EEG.  Neurology was consulted.  Status epilepticus resolved after phenobarbital administration.  During workup, patient was also found to have evidence of an acute versus early subacute infarct involving the right brachium pontis.  NG tube placed on 10/23 for nutrition. AED were weaned with improvement of mental status. Diet advanced and patient started mobility with physical therapy. During hemodialysis session on 2023-04-02, patient was found to be pulseless with agonal breathing; ventricular fibrillation noted on monitoring. Patient received rescue breaths with bag valve mask but CPR not initiated secondary to code status.  Assessment and Plan:  Status epilepticus New seizure diagnosis. Episode occurred after surgery. Associated CVA. Neurology consulted. Patient  noted to have evidence of status epilepticus on EEG. Status epilepticus resolved after phenobarbital. MRI brain repeated and is stable. -Neurology recommendations: phenobarbital taper, discontinue Vimpat, titrating down Keppra   Acute vs early subacute infarct MRI brain 24-Mar-2023) confirms acute vs early subacute infarct involving the right brachium pontis. MRA head without proximal large vessel occlusion or high-grade stenosis. LDL of 105. Hemoglobin A1C of 7.7%. Transthoracic Echocardiogram with bubble study obtained on 10/20 and was significant for an LVEF of 40-45% with no thrombus or atrial level shunt. Neurology recommendations for continued anticoagulation with Eliquis. Recommendation for Neurology follow-up in 4 weeks.   Dysphagia Secondary to acute stroke seizures with resultant encephalopathy.  NG Cortrak placed on 10/23.  Discussed long-term nutrition goals with daughter at bedside.  Daughter would be okay with PEG tube for feeding if patient's quality of life would benefit however she would decline PEG if good quality of life was not achievable. Mental status improved and speech therapy has now recommended a dysphagia 3 diet. -Continue Cortrak and tube feeds; discontinue if adequate oral intake -Dysphagia 3 diet   Paroxysmal atrial fibrillation with RVR -Continue metoprolol and heparin IV   Closed left hip fracture Secondary to fall.  Orthopedic surgery was consulted on admission and performed left intertrochanteric fracture IM rodding on 10/19.  Therapy held secondary to encephalopathy and seizures.   Acute anemia Unclear etiology. Complicated by anticoagulation. Anemia panel suggests chronic illness picture. No obvious evidence of hemorrhaging/GI bleeding. Hemoglobin back down. -FOBT -Continue Heparin IV   Diabetes mellitus type 2 Well-controlled with hemoglobin A1c of 7.7%.  Patient has been having hypoglycemia this admission secondary to poor oral intake in addition to insulin  administration.  Insulin held.  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 female with a history of ESRD on hemodialysis, CAD status post PCI with stent placement, heart failure with reduced EF, diabetes mellitus type 2, hypertension.  Patient presented secondary to fall with subsequent left hip pain and was found to have evidence of a left hip fracture.  Patient underwent left intertrochanteric IM rodding on 10/19, however on 10/20, patient was found to have intermittent facial tremors with bilateral upper extremity tremors with confirmation of status epilepticus on EEG.  Neurology was consulted.  Status epilepticus resolved after phenobarbital administration.  During workup, patient was also found to have evidence of an acute versus early subacute infarct involving the right brachium pontis.  NG tube placed on 10/23 for nutrition. AED were weaned with improvement of mental status. Diet advanced and patient started mobility with physical therapy. During hemodialysis session on 2023-04-02, patient was found to be pulseless with agonal breathing; ventricular fibrillation noted on monitoring. Patient received rescue breaths with bag valve mask but CPR not initiated secondary to code status.  Assessment and Plan:  Status epilepticus New seizure diagnosis. Episode occurred after surgery. Associated CVA. Neurology consulted. Patient  noted to have evidence of status epilepticus on EEG. Status epilepticus resolved after phenobarbital. MRI brain repeated and is stable. -Neurology recommendations: phenobarbital taper, discontinue Vimpat, titrating down Keppra   Acute vs early subacute infarct MRI brain 24-Mar-2023) confirms acute vs early subacute infarct involving the right brachium pontis. MRA head without proximal large vessel occlusion or high-grade stenosis. LDL of 105. Hemoglobin A1C of 7.7%. Transthoracic Echocardiogram with bubble study obtained on 10/20 and was significant for an LVEF of 40-45% with no thrombus or atrial level shunt. Neurology recommendations for continued anticoagulation with Eliquis. Recommendation for Neurology follow-up in 4 weeks.   Dysphagia Secondary to acute stroke seizures with resultant encephalopathy.  NG Cortrak placed on 10/23.  Discussed long-term nutrition goals with daughter at bedside.  Daughter would be okay with PEG tube for feeding if patient's quality of life would benefit however she would decline PEG if good quality of life was not achievable. Mental status improved and speech therapy has now recommended a dysphagia 3 diet. -Continue Cortrak and tube feeds; discontinue if adequate oral intake -Dysphagia 3 diet   Paroxysmal atrial fibrillation with RVR -Continue metoprolol and heparin IV   Closed left hip fracture Secondary to fall.  Orthopedic surgery was consulted on admission and performed left intertrochanteric fracture IM rodding on 10/19.  Therapy held secondary to encephalopathy and seizures.   Acute anemia Unclear etiology. Complicated by anticoagulation. Anemia panel suggests chronic illness picture. No obvious evidence of hemorrhaging/GI bleeding. Hemoglobin back down. -FOBT -Continue Heparin IV   Diabetes mellitus type 2 Well-controlled with hemoglobin A1c of 7.7%.  Patient has been having hypoglycemia this admission secondary to poor oral intake in addition to insulin  administration.  Insulin held.  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 female with a history of ESRD on hemodialysis, CAD status post PCI with stent placement, heart failure with reduced EF, diabetes mellitus type 2, hypertension.  Patient presented secondary to fall with subsequent left hip pain and was found to have evidence of a left hip fracture.  Patient underwent left intertrochanteric IM rodding on 10/19, however on 10/20, patient was found to have intermittent facial tremors with bilateral upper extremity tremors with confirmation of status epilepticus on EEG.  Neurology was consulted.  Status epilepticus resolved after phenobarbital administration.  During workup, patient was also found to have evidence of an acute versus early subacute infarct involving the right brachium pontis.  NG tube placed on 10/23 for nutrition. AED were weaned with improvement of mental status. Diet advanced and patient started mobility with physical therapy. During hemodialysis session on 2023-04-02, patient was found to be pulseless with agonal breathing; ventricular fibrillation noted on monitoring. Patient received rescue breaths with bag valve mask but CPR not initiated secondary to code status.  Assessment and Plan:  Status epilepticus New seizure diagnosis. Episode occurred after surgery. Associated CVA. Neurology consulted. Patient  noted to have evidence of status epilepticus on EEG. Status epilepticus resolved after phenobarbital. MRI brain repeated and is stable. -Neurology recommendations: phenobarbital taper, discontinue Vimpat, titrating down Keppra   Acute vs early subacute infarct MRI brain 24-Mar-2023) confirms acute vs early subacute infarct involving the right brachium pontis. MRA head without proximal large vessel occlusion or high-grade stenosis. LDL of 105. Hemoglobin A1C of 7.7%. Transthoracic Echocardiogram with bubble study obtained on 10/20 and was significant for an LVEF of 40-45% with no thrombus or atrial level shunt. Neurology recommendations for continued anticoagulation with Eliquis. Recommendation for Neurology follow-up in 4 weeks.   Dysphagia Secondary to acute stroke seizures with resultant encephalopathy.  NG Cortrak placed on 10/23.  Discussed long-term nutrition goals with daughter at bedside.  Daughter would be okay with PEG tube for feeding if patient's quality of life would benefit however she would decline PEG if good quality of life was not achievable. Mental status improved and speech therapy has now recommended a dysphagia 3 diet. -Continue Cortrak and tube feeds; discontinue if adequate oral intake -Dysphagia 3 diet   Paroxysmal atrial fibrillation with RVR -Continue metoprolol and heparin IV   Closed left hip fracture Secondary to fall.  Orthopedic surgery was consulted on admission and performed left intertrochanteric fracture IM rodding on 10/19.  Therapy held secondary to encephalopathy and seizures.   Acute anemia Unclear etiology. Complicated by anticoagulation. Anemia panel suggests chronic illness picture. No obvious evidence of hemorrhaging/GI bleeding. Hemoglobin back down. -FOBT -Continue Heparin IV   Diabetes mellitus type 2 Well-controlled with hemoglobin A1c of 7.7%.  Patient has been having hypoglycemia this admission secondary to poor oral intake in addition to insulin  administration.  Insulin held.  D10 fluids initially started to help with  to have tremor-like activity in bilateral upper extremity at times but it was difficult to see if this was time locked with the polyspikes.  Patient was loaded with valproic acid and subsequently with lacosamide without significant improvement in EEG.  Eventually patient was loaded with levetiracetam on 03/15/2023 at around midnight.  However, EEG continued to show continuous generalized 3 to 5 Hz theta-delta slowing admixed with generalized spike and wave  as well as polyspikes which appeared near continuous. Hyperventilation and photic stimulation were not performed.    ABNORMALITY - Status epilepticus, generalized - Continuous slow, generalized  IMPRESSION: This study showed evidence of status epilepticus with generalized onset.  Additionally there is moderate to severe diffuse encephalopathy.  Robin [name]  CT HEAD WO CONTRAST ( )  Result Date: 03/15/2023 CLINICAL DATA:  Stroke, follow-up EXAM: CT HEAD WITHOUT CONTRAST TECHNIQUE: Contiguous axial images were obtained from the base of the skull through the vertex without intravenous contrast. RADIATION DOSE REDUCTION: This exam was performed according to the departmental dose-optimization program which includes automated exposure control, adjustment of the mA and/or kV according to patient size and/or use of iterative reconstruction technique. COMPARISON:  03/12/2023 CT head and MRI head FINDINGS: Brain: Redemonstrated hypodensity in right brachium pontis, which correlates with the acute infarct noted on the prior MRI, with new hypodensity in the right greater than left aspect of the pons (series 3, image 10), which may be artifactual. No evidence of acute hemorrhage, mass, mass effect, or midline shift. No hydrocephalus or extra-axial fluid collection. Vascular: No hyperdense vessel. Redemonstrated bilateral pipeline stents. Atherosclerotic calcifications in the vertebral arteries. Skull: Negative for fracture or focal lesion. Sinuses/Orbits: No acute finding. Other: The mastoid air cells are well aerated. IMPRESSION: 1. New hypodensity in the right greater than left aspect of the pons, which may be artifactual. Consider MRI for further evaluation. 2. 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 No acute intracranial hemorrhage. These results will be called to the ordering clinician or representative by the Radiologist Assistant, and communication documented in the PACS or [employer].  Electronically Signed   By: Wiliam Ke M.D.   On: 03/15/2023 19:51   ECHOCARDIOGRAM COMPLETE BUBBLE STUDY  Result Date: 03/15/2023    ECHOCARDIOGRAM REPORT   Patient Name:   Robin [name] Date of Exam: 03/15/2023 Medical Rec #:  [id number]    Height:       64.0 in Accession #:    [id number]   Weight:       116.6 lb Date of Birth:  [birth year]    BSA:          1.556 m Patient Age:    72 years     BP:           206/187 mmHg Patient Gender: F            HR:           96 bpm. Exam Location:  Inpatient Procedure: 2D Echo, Cardiac Doppler and Color Doppler Indications:    stroke  History:        Patient has prior history of Echocardiogram examinations, most                 recent 07/29/2022. CHF, end stage renal disease, Arrythmias:PAC;                 Risk Factors:Diabetes, Dyslipidemia and Current Smoker.  Sonographer:    Delcie Roch RDCS Referring Phys: [id number] MICHAEL A MOORE IMPRESSIONS  1.  DEATH SUMMARY   Patient Details  Name: Robin [name] MRN: [id number] DOB: April 19, 1951 ONG:EXBMWU, Ria Clock Medical Admission/Discharge Information   Admit Date:  03/24/2023  Date of Death: Date of Death: 04/02/2023  Time of Death: Time of Death: 1211-04-11  Length of Stay: 9   Principle Cause of death: Ventricular fibrillation  Hospital Diagnoses: Principal Problem:   Displaced intertrochanteric fracture of left femur, initial encounter for closed fracture [facility]) Active Problems:   Cerebrovascular accident (CVA) due to thrombosis of right cerebellar artery (HCC)   Protein-calorie malnutrition, severe   Hospital Course: Robin [name] is a 72 y.o. female with a history of ESRD on hemodialysis, CAD status post PCI with stent placement, heart failure with reduced EF, diabetes mellitus type 2, hypertension.  Patient presented secondary to fall with subsequent left hip pain and was found to have evidence of a left hip fracture.  Patient underwent left intertrochanteric IM rodding on 10/19, however on 10/20, patient was found to have intermittent facial tremors with bilateral upper extremity tremors with confirmation of status epilepticus on EEG.  Neurology was consulted.  Status epilepticus resolved after phenobarbital administration.  During workup, patient was also found to have evidence of an acute versus early subacute infarct involving the right brachium pontis.  NG tube placed on 10/23 for nutrition. AED were weaned with improvement of mental status. Diet advanced and patient started mobility with physical therapy. During hemodialysis session on 2023-04-02, patient was found to be pulseless with agonal breathing; ventricular fibrillation noted on monitoring. Patient received rescue breaths with bag valve mask but CPR not initiated secondary to code status.  Assessment and Plan:  Status epilepticus New seizure diagnosis. Episode occurred after surgery. Associated CVA. Neurology consulted. Patient  noted to have evidence of status epilepticus on EEG. Status epilepticus resolved after phenobarbital. MRI brain repeated and is stable. -Neurology recommendations: phenobarbital taper, discontinue Vimpat, titrating down Keppra   Acute vs early subacute infarct MRI brain 24-Mar-2023) confirms acute vs early subacute infarct involving the right brachium pontis. MRA head without proximal large vessel occlusion or high-grade stenosis. LDL of 105. Hemoglobin A1C of 7.7%. Transthoracic Echocardiogram with bubble study obtained on 10/20 and was significant for an LVEF of 40-45% with no thrombus or atrial level shunt. Neurology recommendations for continued anticoagulation with Eliquis. Recommendation for Neurology follow-up in 4 weeks.   Dysphagia Secondary to acute stroke seizures with resultant encephalopathy.  NG Cortrak placed on 10/23.  Discussed long-term nutrition goals with daughter at bedside.  Daughter would be okay with PEG tube for feeding if patient's quality of life would benefit however she would decline PEG if good quality of life was not achievable. Mental status improved and speech therapy has now recommended a dysphagia 3 diet. -Continue Cortrak and tube feeds; discontinue if adequate oral intake -Dysphagia 3 diet   Paroxysmal atrial fibrillation with RVR -Continue metoprolol and heparin IV   Closed left hip fracture Secondary to fall.  Orthopedic surgery was consulted on admission and performed left intertrochanteric fracture IM rodding on 10/19.  Therapy held secondary to encephalopathy and seizures.   Acute anemia Unclear etiology. Complicated by anticoagulation. Anemia panel suggests chronic illness picture. No obvious evidence of hemorrhaging/GI bleeding. Hemoglobin back down. -FOBT -Continue Heparin IV   Diabetes mellitus type 2 Well-controlled with hemoglobin A1c of 7.7%.  Patient has been having hypoglycemia this admission secondary to poor oral intake in addition to insulin  administration.  Insulin held.  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 female with a history of ESRD on hemodialysis, CAD status post PCI with stent placement, heart failure with reduced EF, diabetes mellitus type 2, hypertension.  Patient presented secondary to fall with subsequent left hip pain and was found to have evidence of a left hip fracture.  Patient underwent left intertrochanteric IM rodding on 10/19, however on 10/20, patient was found to have intermittent facial tremors with bilateral upper extremity tremors with confirmation of status epilepticus on EEG.  Neurology was consulted.  Status epilepticus resolved after phenobarbital administration.  During workup, patient was also found to have evidence of an acute versus early subacute infarct involving the right brachium pontis.  NG tube placed on 10/23 for nutrition. AED were weaned with improvement of mental status. Diet advanced and patient started mobility with physical therapy. During hemodialysis session on 2023-04-02, patient was found to be pulseless with agonal breathing; ventricular fibrillation noted on monitoring. Patient received rescue breaths with bag valve mask but CPR not initiated secondary to code status.  Assessment and Plan:  Status epilepticus New seizure diagnosis. Episode occurred after surgery. Associated CVA. Neurology consulted. Patient  noted to have evidence of status epilepticus on EEG. Status epilepticus resolved after phenobarbital. MRI brain repeated and is stable. -Neurology recommendations: phenobarbital taper, discontinue Vimpat, titrating down Keppra   Acute vs early subacute infarct MRI brain 24-Mar-2023) confirms acute vs early subacute infarct involving the right brachium pontis. MRA head without proximal large vessel occlusion or high-grade stenosis. LDL of 105. Hemoglobin A1C of 7.7%. Transthoracic Echocardiogram with bubble study obtained on 10/20 and was significant for an LVEF of 40-45% with no thrombus or atrial level shunt. Neurology recommendations for continued anticoagulation with Eliquis. Recommendation for Neurology follow-up in 4 weeks.   Dysphagia Secondary to acute stroke seizures with resultant encephalopathy.  NG Cortrak placed on 10/23.  Discussed long-term nutrition goals with daughter at bedside.  Daughter would be okay with PEG tube for feeding if patient's quality of life would benefit however she would decline PEG if good quality of life was not achievable. Mental status improved and speech therapy has now recommended a dysphagia 3 diet. -Continue Cortrak and tube feeds; discontinue if adequate oral intake -Dysphagia 3 diet   Paroxysmal atrial fibrillation with RVR -Continue metoprolol and heparin IV   Closed left hip fracture Secondary to fall.  Orthopedic surgery was consulted on admission and performed left intertrochanteric fracture IM rodding on 10/19.  Therapy held secondary to encephalopathy and seizures.   Acute anemia Unclear etiology. Complicated by anticoagulation. Anemia panel suggests chronic illness picture. No obvious evidence of hemorrhaging/GI bleeding. Hemoglobin back down. -FOBT -Continue Heparin IV   Diabetes mellitus type 2 Well-controlled with hemoglobin A1c of 7.7%.  Patient has been having hypoglycemia this admission secondary to poor oral intake in addition to insulin  administration.  Insulin held.  D10 fluids initially started to help with  to have tremor-like activity in bilateral upper extremity at times but it was difficult to see if this was time locked with the polyspikes.  Patient was loaded with valproic acid and subsequently with lacosamide without significant improvement in EEG.  Eventually patient was loaded with levetiracetam on 03/15/2023 at around midnight.  However, EEG continued to show continuous generalized 3 to 5 Hz theta-delta slowing admixed with generalized spike and wave  as well as polyspikes which appeared near continuous. Hyperventilation and photic stimulation were not performed.    ABNORMALITY - Status epilepticus, generalized - Continuous slow, generalized  IMPRESSION: This study showed evidence of status epilepticus with generalized onset.  Additionally there is moderate to severe diffuse encephalopathy.  Robin [name]  CT HEAD WO CONTRAST ( )  Result Date: 03/15/2023 CLINICAL DATA:  Stroke, follow-up EXAM: CT HEAD WITHOUT CONTRAST TECHNIQUE: Contiguous axial images were obtained from the base of the skull through the vertex without intravenous contrast. RADIATION DOSE REDUCTION: This exam was performed according to the departmental dose-optimization program which includes automated exposure control, adjustment of the mA and/or kV according to patient size and/or use of iterative reconstruction technique. COMPARISON:  03/12/2023 CT head and MRI head FINDINGS: Brain: Redemonstrated hypodensity in right brachium pontis, which correlates with the acute infarct noted on the prior MRI, with new hypodensity in the right greater than left aspect of the pons (series 3, image 10), which may be artifactual. No evidence of acute hemorrhage, mass, mass effect, or midline shift. No hydrocephalus or extra-axial fluid collection. Vascular: No hyperdense vessel. Redemonstrated bilateral pipeline stents. Atherosclerotic calcifications in the vertebral arteries. Skull: Negative for fracture or focal lesion. Sinuses/Orbits: No acute finding. Other: The mastoid air cells are well aerated. IMPRESSION: 1. New hypodensity in the right greater than left aspect of the pons, which may be artifactual. Consider MRI for further evaluation. 2. 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 No acute intracranial hemorrhage. These results will be called to the ordering clinician or representative by the Radiologist Assistant, and communication documented in the PACS or [employer].  Electronically Signed   By: Wiliam Ke M.D.   On: 03/15/2023 19:51   ECHOCARDIOGRAM COMPLETE BUBBLE STUDY  Result Date: 03/15/2023    ECHOCARDIOGRAM REPORT   Patient Name:   Robin [name] Date of Exam: 03/15/2023 Medical Rec #:  [id number]    Height:       64.0 in Accession #:    [id number]   Weight:       116.6 lb Date of Birth:  [birth year]    BSA:          1.556 m Patient Age:    72 years     BP:           206/187 mmHg Patient Gender: F            HR:           96 bpm. Exam Location:  Inpatient Procedure: 2D Echo, Cardiac Doppler and Color Doppler Indications:    stroke  History:        Patient has prior history of Echocardiogram examinations, most                 recent 07/29/2022. CHF, end stage renal disease, Arrythmias:PAC;                 Risk Factors:Diabetes, Dyslipidemia and Current Smoker.  Sonographer:    Delcie Roch RDCS Referring Phys: [id number] MICHAEL A MOORE IMPRESSIONS  1.

## 2023-03-27 DEATH — deceased

## 2023-04-02 ENCOUNTER — Encounter: Payer: No Typology Code available for payment source | Admitting: Orthopedic Surgery

## 2023-04-17 ENCOUNTER — Ambulatory Visit: Payer: No Typology Code available for payment source | Admitting: Physician Assistant
